# Patient Record
Sex: Female | Born: 1965 | Race: White | Hispanic: No | Smoking: Never smoker
Health system: Southern US, Community
[De-identification: ages and names within clinical notes are randomized; demographics above are authoritative.]

## PROBLEM LIST (undated history)

## (undated) DIAGNOSIS — E7800 Pure hypercholesterolemia, unspecified: Secondary | ICD-10-CM

## (undated) DIAGNOSIS — F329 Major depressive disorder, single episode, unspecified: Secondary | ICD-10-CM

## (undated) DIAGNOSIS — M1990 Unspecified osteoarthritis, unspecified site: Secondary | ICD-10-CM

## (undated) DIAGNOSIS — Z87442 Personal history of urinary calculi: Secondary | ICD-10-CM

## (undated) DIAGNOSIS — F32A Depression, unspecified: Secondary | ICD-10-CM

## (undated) DIAGNOSIS — D332 Benign neoplasm of brain, unspecified: Secondary | ICD-10-CM

## (undated) DIAGNOSIS — K635 Polyp of colon: Secondary | ICD-10-CM

## (undated) DIAGNOSIS — G40909 Epilepsy, unspecified, not intractable, without status epilepticus: Secondary | ICD-10-CM

## (undated) DIAGNOSIS — R569 Unspecified convulsions: Secondary | ICD-10-CM

## (undated) DIAGNOSIS — K760 Fatty (change of) liver, not elsewhere classified: Secondary | ICD-10-CM

## (undated) DIAGNOSIS — G2581 Restless legs syndrome: Secondary | ICD-10-CM

## (undated) DIAGNOSIS — R42 Dizziness and giddiness: Secondary | ICD-10-CM

## (undated) DIAGNOSIS — L309 Dermatitis, unspecified: Secondary | ICD-10-CM

## (undated) DIAGNOSIS — J42 Unspecified chronic bronchitis: Secondary | ICD-10-CM

## (undated) HISTORY — DX: Unspecified convulsions: R56.9

## (undated) HISTORY — PX: JOINT REPLACEMENT: SHX530

## (undated) HISTORY — DX: Unspecified osteoarthritis, unspecified site: M19.90

## (undated) HISTORY — DX: Polyp of colon: K63.5

## (undated) HISTORY — PX: BLADDER SUSPENSION: SHX72

## (undated) HISTORY — PX: TOTAL HIP ARTHROPLASTY: SHX124

## (undated) HISTORY — PX: COLONOSCOPY: SHX174

---

## 1997-09-25 ENCOUNTER — Emergency Department (HOSPITAL_COMMUNITY): Admission: EM | Admit: 1997-09-25 | Discharge: 1997-09-25 | Payer: Self-pay | Admitting: Emergency Medicine

## 1998-01-13 HISTORY — PX: BUNIONECTOMY: SHX129

## 1999-01-14 HISTORY — PX: ABDOMINAL HYSTERECTOMY: SHX81

## 1999-05-10 ENCOUNTER — Inpatient Hospital Stay (HOSPITAL_COMMUNITY): Admission: RE | Admit: 1999-05-10 | Discharge: 1999-05-15 | Payer: Self-pay | Admitting: Orthopedic Surgery

## 1999-05-10 ENCOUNTER — Encounter: Payer: Self-pay | Admitting: Orthopedic Surgery

## 2000-05-01 ENCOUNTER — Encounter: Admission: RE | Admit: 2000-05-01 | Discharge: 2000-05-01 | Payer: Self-pay | Admitting: *Deleted

## 2000-05-01 ENCOUNTER — Encounter: Payer: Self-pay | Admitting: *Deleted

## 2000-09-02 ENCOUNTER — Ambulatory Visit (HOSPITAL_COMMUNITY): Admission: RE | Admit: 2000-09-02 | Discharge: 2000-09-02 | Payer: Self-pay | Admitting: *Deleted

## 2000-10-12 ENCOUNTER — Encounter: Payer: Self-pay | Admitting: *Deleted

## 2000-10-12 ENCOUNTER — Inpatient Hospital Stay (HOSPITAL_COMMUNITY): Admission: EM | Admit: 2000-10-12 | Discharge: 2000-10-13 | Payer: Self-pay | Admitting: *Deleted

## 2000-11-22 ENCOUNTER — Inpatient Hospital Stay (HOSPITAL_COMMUNITY): Admission: EM | Admit: 2000-11-22 | Discharge: 2000-11-24 | Payer: Self-pay

## 2001-03-15 ENCOUNTER — Encounter: Payer: Self-pay | Admitting: Internal Medicine

## 2001-03-15 ENCOUNTER — Inpatient Hospital Stay (HOSPITAL_COMMUNITY): Admission: EM | Admit: 2001-03-15 | Discharge: 2001-03-17 | Payer: Self-pay | Admitting: *Deleted

## 2001-03-28 ENCOUNTER — Emergency Department (HOSPITAL_COMMUNITY): Admission: EM | Admit: 2001-03-28 | Discharge: 2001-03-28 | Payer: Self-pay | Admitting: Emergency Medicine

## 2001-07-02 ENCOUNTER — Emergency Department (HOSPITAL_COMMUNITY): Admission: EM | Admit: 2001-07-02 | Discharge: 2001-07-02 | Payer: Self-pay | Admitting: Emergency Medicine

## 2002-03-18 ENCOUNTER — Encounter: Payer: Self-pay | Admitting: Internal Medicine

## 2002-03-18 ENCOUNTER — Observation Stay (HOSPITAL_COMMUNITY): Admission: EM | Admit: 2002-03-18 | Discharge: 2002-03-19 | Payer: Self-pay | Admitting: Emergency Medicine

## 2002-04-28 ENCOUNTER — Encounter: Payer: Self-pay | Admitting: Emergency Medicine

## 2002-04-28 ENCOUNTER — Emergency Department (HOSPITAL_COMMUNITY): Admission: EM | Admit: 2002-04-28 | Discharge: 2002-04-28 | Payer: Self-pay | Admitting: Emergency Medicine

## 2002-09-22 ENCOUNTER — Emergency Department (HOSPITAL_COMMUNITY): Admission: EM | Admit: 2002-09-22 | Discharge: 2002-09-22 | Payer: Self-pay

## 2002-10-24 ENCOUNTER — Emergency Department (HOSPITAL_COMMUNITY): Admission: EM | Admit: 2002-10-24 | Discharge: 2002-10-25 | Payer: Self-pay | Admitting: Emergency Medicine

## 2003-07-22 ENCOUNTER — Emergency Department (HOSPITAL_COMMUNITY): Admission: EM | Admit: 2003-07-22 | Discharge: 2003-07-23 | Payer: Self-pay | Admitting: Emergency Medicine

## 2003-07-31 ENCOUNTER — Emergency Department (HOSPITAL_COMMUNITY): Admission: EM | Admit: 2003-07-31 | Discharge: 2003-07-31 | Payer: Self-pay | Admitting: Emergency Medicine

## 2003-10-31 ENCOUNTER — Other Ambulatory Visit: Admission: RE | Admit: 2003-10-31 | Discharge: 2003-10-31 | Payer: Self-pay | Admitting: Family Medicine

## 2004-02-03 ENCOUNTER — Ambulatory Visit: Payer: Self-pay | Admitting: Psychiatry

## 2004-02-03 ENCOUNTER — Inpatient Hospital Stay (HOSPITAL_COMMUNITY): Admission: AD | Admit: 2004-02-03 | Discharge: 2004-02-06 | Payer: Self-pay | Admitting: Psychiatry

## 2004-07-01 ENCOUNTER — Other Ambulatory Visit: Admission: RE | Admit: 2004-07-01 | Discharge: 2004-07-01 | Payer: Self-pay | Admitting: Obstetrics and Gynecology

## 2005-01-06 ENCOUNTER — Emergency Department (HOSPITAL_COMMUNITY): Admission: EM | Admit: 2005-01-06 | Discharge: 2005-01-06 | Payer: Self-pay | Admitting: Emergency Medicine

## 2005-04-23 ENCOUNTER — Ambulatory Visit (HOSPITAL_COMMUNITY): Admission: RE | Admit: 2005-04-23 | Discharge: 2005-04-23 | Payer: Self-pay | Admitting: Podiatry

## 2005-09-24 ENCOUNTER — Ambulatory Visit (HOSPITAL_COMMUNITY): Admission: RE | Admit: 2005-09-24 | Discharge: 2005-09-24 | Payer: Self-pay | Admitting: Podiatry

## 2006-07-03 ENCOUNTER — Ambulatory Visit (HOSPITAL_COMMUNITY): Admission: RE | Admit: 2006-07-03 | Discharge: 2006-07-03 | Payer: Self-pay | Admitting: Family Medicine

## 2006-11-25 ENCOUNTER — Ambulatory Visit (HOSPITAL_BASED_OUTPATIENT_CLINIC_OR_DEPARTMENT_OTHER): Admission: RE | Admit: 2006-11-25 | Discharge: 2006-11-25 | Payer: Self-pay | Admitting: Orthopedic Surgery

## 2007-01-05 ENCOUNTER — Emergency Department (HOSPITAL_COMMUNITY): Admission: EM | Admit: 2007-01-05 | Discharge: 2007-01-05 | Payer: Self-pay | Admitting: Emergency Medicine

## 2007-01-27 ENCOUNTER — Inpatient Hospital Stay (HOSPITAL_COMMUNITY): Admission: RE | Admit: 2007-01-27 | Discharge: 2007-01-28 | Payer: Self-pay | Admitting: Obstetrics and Gynecology

## 2007-01-27 ENCOUNTER — Encounter (INDEPENDENT_AMBULATORY_CARE_PROVIDER_SITE_OTHER): Payer: Self-pay | Admitting: Obstetrics and Gynecology

## 2007-03-02 ENCOUNTER — Encounter: Admission: RE | Admit: 2007-03-02 | Discharge: 2007-03-02 | Payer: Self-pay | Admitting: Family Medicine

## 2007-06-30 ENCOUNTER — Encounter: Admission: RE | Admit: 2007-06-30 | Discharge: 2007-06-30 | Payer: Self-pay | Admitting: Family Medicine

## 2007-07-21 ENCOUNTER — Ambulatory Visit: Payer: Self-pay | Admitting: Internal Medicine

## 2007-07-21 DIAGNOSIS — R195 Other fecal abnormalities: Secondary | ICD-10-CM | POA: Insufficient documentation

## 2007-07-21 DIAGNOSIS — K5900 Constipation, unspecified: Secondary | ICD-10-CM | POA: Insufficient documentation

## 2007-07-21 DIAGNOSIS — R1031 Right lower quadrant pain: Secondary | ICD-10-CM | POA: Insufficient documentation

## 2007-08-02 ENCOUNTER — Encounter: Admission: RE | Admit: 2007-08-02 | Discharge: 2007-08-02 | Payer: Self-pay | Admitting: Obstetrics and Gynecology

## 2007-08-11 ENCOUNTER — Encounter: Payer: Self-pay | Admitting: Internal Medicine

## 2007-08-11 ENCOUNTER — Ambulatory Visit: Payer: Self-pay | Admitting: Internal Medicine

## 2007-08-13 ENCOUNTER — Encounter: Payer: Self-pay | Admitting: Internal Medicine

## 2007-12-03 ENCOUNTER — Emergency Department (HOSPITAL_COMMUNITY): Admission: EM | Admit: 2007-12-03 | Discharge: 2007-12-04 | Payer: Self-pay | Admitting: Emergency Medicine

## 2008-03-01 ENCOUNTER — Emergency Department (HOSPITAL_COMMUNITY): Admission: EM | Admit: 2008-03-01 | Discharge: 2008-03-01 | Payer: Self-pay | Admitting: Emergency Medicine

## 2009-03-01 ENCOUNTER — Emergency Department (HOSPITAL_COMMUNITY): Admission: EM | Admit: 2009-03-01 | Discharge: 2009-03-01 | Payer: Self-pay | Admitting: Emergency Medicine

## 2010-02-12 NOTE — Letter (Signed)
Summary: Patient Notice- Polyp Results  [facility]  [address] [place], [place] [address]   Phone: [phone]  Fax: [phone]        August 13, 2007 MRN: [id number]    [facility] [address] [place], [place]  [address]    Dear Ms. Gass,  I am pleased to inform you that the colon polyp(s) removed during your recent colonoscopy was (were) found to be benign (no cancer detected) upon pathologic examination.  I recommend you have a repeat colonoscopy examination in 5 years to look for recurrent polyps, as having colon polyps increases your risk for having recurrent polyps or even colon cancer in the future.  Should you develop new or worsening symptoms of abdominal pain, bowel habit changes or bleeding from the rectum or bowels, please schedule an evaluation with either your primary care physician or with me.  Additional information/recommendations:  _X_ No further action with gastroenterology is needed at this time. Please      follow-up with your primary care physician for your other healthcare      needs.  Please call us if you are having persistent problems or have questions about your condition that have not been fully answered at this time.  Sincerely,  Hilarie Fredrickson MD  This letter has been electronically signed by your physician.

## 2010-02-12 NOTE — Procedures (Signed)
Summary: Colonoscopy   Colonoscopy  Procedure date:  08/11/2007  Findings:      Location:  [facility].    Procedures Next Due Date:    Colonoscopy: 08/2012  Patient Name: [name], [name] MRN:  Procedure Procedures: Colonoscopy CPT: 78295.    with polypectomy. CPT: A3573898.  Personnel: Endoscopist: Monica Bonito. Marina Goodell, MD.  Referred By: Monica Heap, MD.  Exam Location: Exam performed in Outpatient Clinic. Outpatient  Patient Consent: Procedure, Alternatives, Risks and Benefits discussed, consent obtained, from patient. Consent was obtained by the RN.  Indications  Evaluation of: Positive fecal occult blood test per home screening.  History  Current Medications: Patient is not currently taking Coumadin.  Pre-Exam Physical: Performed Aug 11, 2007. Cardio-pulmonary exam, Rectal exam, HEENT exam , Abdominal exam, Mental status exam WNL.  Comments: Pt. history reviewed/updated, physical exam performed prior to initiation of sedation?YES Exam Exam: Extent of exam reached: Terminal Ileum, extent intended: Terminal Ileum.  The cecum was identified by appendiceal orifice and IC valve. Patient position: on left side. Time to Cecum: 00:03:50. Time for Withdrawl: 00:17:02. Colon retroflexion performed. Images taken. ASA Classification: II. Tolerance: excellent.  Monitoring: Pulse and BP monitoring, Oximetry used. Supplemental O2 given.  Colon Prep Used MOVI PREP for colon prep. Prep results: good.  Sedation Meds: Patient assessed and found to be appropriate for moderate (conscious) sedation. Fentanyl 100 mcg. given IV. Versed 8 mg. given IV.  Findings NORMAL EXAM: Cecum to Rectum.  NORMAL EXAM: Ileum.  POLYP: Cecum, Procedure:  snare without cautery, removed, retrieved, Polyp sent to pathology. ICD9: Colon Polyps: 211.3. Comments: , .  POLYP: Sigmoid Colon, Procedure:  snare without cautery, removed, retrieved, sent to pathology.    Assessment  Diagnoses: 211.3: Colon Polyps. X 3.   Events  Unplanned Interventions: No intervention was required.  Unplanned Events: There were no complications. Plans Disposition: After procedure patient sent to recovery. After recovery patient sent home.  Scheduling/Referral: Colonoscopy, to Monica Bonito. Marina Goodell, MD, IN 5 YEARS IF POLYPS ADENOMATOUS; OTHERWISE 10 YRS,    cc:  Monica Heap, MD      Monica Pro. Curlene Labrum, MD     REPORT OF SURGICAL PATHOLOGY   Case #: [id number] Patient Name: [name], [name] Office Chart Number:  [name] [id number]   MRN: [id number] Pathologist: Monica Gandy. Luisa Hart, MD DOB/Age  Nov 24, 1965 (Age: 45)    Gender: F Date Taken:  08/11/2007 Date Received: 08/12/2007   FINAL DIAGNOSIS   ***MICROSCOPIC EXAMINATION AND DIAGNOSIS***   1.  COLON, CECAL POLYPS:  TWO TUBULAR ADENOMAS.  NO HIGH GRADE DYSPLASIA OR MALIGNANCY IDENTIFIED.   2.  COLON, SIGMOID POLYPS:  HYPERPLASTIC POLYP(S).  NO ADENOMATOUS CHANGE OR MALIGNANCY IDENTIFIED.   cc Date Reported:  08/13/2007     Monica Gandy. Luisa Hart, MD *** Electronically Signed Out By JDP ***   Clinical information R/O adenomatous changes.  (cdc)   specimen(s) obtained 1: Colon, polyp(s), cecal 2: Colon, polyp(s), sigmoid   Gross Description 1.  Received in formalin are tan, soft tissue fragments that are submitted in toto.  Number:  two. Size:  both 0.5 cm, one block.   2.  Received in formalin is a single glistening, tan mucosal fragment, 0.3 cm.  The container and lid are rechecked for additional specimens and none are identified. One cassette is submitted, one block.  (TB:gt, 08/12/07)    gdt/     August 13, 2007 MRN: [id number]    [facility] [address] [place], [place]  [address]  Dear [name],  I am pleased to inform you that the colon polyp(s) removed during your recent colonoscopy was (were) found to be benign (no cancer detected) upon pathologic examination.  I recommend you have a  repeat colonoscopy examination in 5 years to look for recurrent polyps, as having colon polyps increases your risk for having recurrent polyps or even colon cancer in the future.  Should you develop new or worsening symptoms of abdominal pain, bowel habit changes or bleeding from the rectum or bowels, please schedule an evaluation with either your primary care physician or with me.  Additional information/recommendations:  _X_ No further action with gastroenterology is needed at this time. Please      follow-up with your primary care physician for your other healthcare      needs.  Please call us if you are having persistent problems or have questions about your condition that have not been fully answered at this time.  Sincerely,  Monica Fredrickson MD  This letter has been electronically signed by your physician.  This report was created from the original endoscopy report, which was reviewed and signed by the above listed endoscopist.

## 2010-04-30 LAB — URINALYSIS, ROUTINE W REFLEX MICROSCOPIC
Bilirubin Urine: NEGATIVE
Glucose, UA: NEGATIVE mg/dL
Hgb urine dipstick: NEGATIVE
Ketones, ur: NEGATIVE mg/dL
Nitrite: NEGATIVE
Protein, ur: NEGATIVE mg/dL
Specific Gravity, Urine: 1.023 (ref 1.005–1.030)
Urobilinogen, UA: 1 mg/dL (ref 0.0–1.0)
pH: 7.5 (ref 5.0–8.0)

## 2010-04-30 LAB — DIFFERENTIAL
Basophils Absolute: 0 10*3/uL (ref 0.0–0.1)
Basophils Relative: 0 % (ref 0–1)
Eosinophils Absolute: 0 10*3/uL (ref 0.0–0.7)
Eosinophils Relative: 0 % (ref 0–5)
Lymphocytes Relative: 15 % (ref 12–46)
Lymphs Abs: 1.2 10*3/uL (ref 0.7–4.0)
Monocytes Absolute: 0.4 10*3/uL (ref 0.1–1.0)
Monocytes Relative: 6 % (ref 3–12)
Neutro Abs: 6.4 10*3/uL (ref 1.7–7.7)
Neutrophils Relative %: 80 % — ABNORMAL HIGH (ref 43–77)

## 2010-04-30 LAB — COMPREHENSIVE METABOLIC PANEL
ALT: 28 U/L (ref 0–35)
AST: 26 U/L (ref 0–37)
Albumin: 4.3 g/dL (ref 3.5–5.2)
Alkaline Phosphatase: 81 U/L (ref 39–117)
BUN: 8 mg/dL (ref 6–23)
CO2: 25 mEq/L (ref 19–32)
Calcium: 9.4 mg/dL (ref 8.4–10.5)
Chloride: 108 mEq/L (ref 96–112)
Creatinine, Ser: 0.86 mg/dL (ref 0.4–1.2)
GFR calc Af Amer: >60 mL/min (ref >60)
GFR calc non Af Amer: >60 mL/min (ref >60)
Glucose, Bld: 110 mg/dL — ABNORMAL HIGH (ref 70–99)
Potassium: 3.9 mEq/L (ref 3.5–5.1)
Sodium: 141 mEq/L (ref 135–145)
Total Bilirubin: 0.7 mg/dL (ref 0.3–1.2)
Total Protein: 6.7 g/dL (ref 6.0–8.3)

## 2010-04-30 LAB — RAPID URINE DRUG SCREEN, HOSP PERFORMED
Amphetamines: NOT DETECTED
Barbiturates: NOT DETECTED
Benzodiazepines: POSITIVE — AB
Cocaine: NOT DETECTED
Opiates: NOT DETECTED
Tetrahydrocannabinol: NOT DETECTED

## 2010-04-30 LAB — POCT CARDIAC MARKERS
CKMB, poc: <1 ng/mL — ABNORMAL LOW (ref 1.0–8.0)
CKMB, poc: <1 ng/mL — ABNORMAL LOW (ref 1.0–8.0)
Myoglobin, poc: 53.9 ng/mL (ref 12–200)
Myoglobin, poc: 75.1 ng/mL (ref 12–200)
Troponin i, poc: <0.05 ng/mL (ref 0.00–0.09)
Troponin i, poc: <0.05 ng/mL (ref 0.00–0.09)

## 2010-04-30 LAB — CBC
HCT: 36.1 % (ref 36.0–46.0)
Hemoglobin: 12 g/dL (ref 12.0–15.0)
MCHC: 33.2 g/dL (ref 30.0–36.0)
MCV: 90.9 fL (ref 78.0–100.0)
Platelets: 246 10*3/uL (ref 150–400)
RBC: 3.97 MIL/uL (ref 3.87–5.11)
RDW: 12.2 % (ref 11.5–15.5)
WBC: 8 10*3/uL (ref 4.0–10.5)

## 2010-04-30 LAB — ETHANOL: Alcohol, Ethyl (B): 5 mg/dL (ref 0–10)

## 2010-05-28 NOTE — Consult Note (Signed)
[name], [name]               ACCOUNT NO.:  [id number]   MEDICAL RECORD NO.:  [id number]          PATIENT TYPE:  EMS   LOCATION:  ED                           FACILITY:  [facility]   PHYSICIAN:  Marolyn Hammock. Reynolds, M.D.DATE OF BIRTH:  17-Sep-1965   DATE OF CONSULTATION:  03/01/2008  DATE OF DISCHARGE:                                 CONSULTATION   CHIEF COMPLAINT:  Seizure.   HISTORY OF PRESENT ILLNESS:  This is a pleasant 45 year old white female  who is well known to [name] and [facility].  The patient has a past medical  history of motor seizures along with pseudoseizures.  The patient is  currently on Keppra 1 g t.i.d., Lamictal 200 mg b.i.d., and Ativan 0.5  mg to 1 mg p.r.n. q.6 h.  The patient has not had a known seizure  according to her in the past 2 months.  Husband states that the patient  has been under significant amount of stress recently, as she has  recently lost her job.  Husband is unemployed and son is incarcerated at  this time.  Over the last 3 days, the patient has been complaining of an  earache in her left ear and disequilibrium.  According to husband, this  morning the patient got up, was eating breakfast, went to the living  room and noted that she was having diplopia, called her husband into the  room, stated that she was seeing 3 people on the TV screen at that  moment.  Right leg active in the flailing like motion and followed by  bilateral arms flapping.  No tongue biting or incontinence.  No  drooling.  Post motor activity, the patient had garbled speech which  cleared in approximately 10-15 minutes and has progressively been  clearing throughout the day.  Although the garbled speech has been  clearing, the patient still has disequilibrium which is most notably  when she goes from a lying-to-seated or seated-to-standing position.  The patient states that she states the room moving from right to left,  it lasted approximately 1-2 minutes and then dissipated.   The patient  states that she has been taking her medications on time with the  exception of 1 dose approximately 3 days ago of her Keppra which was the  middle dose.  The patient does not believe that this was a  pseudoseizure.  At the time of interview, the patient is alert.  She is  oriented to time, place, person.  She is well enunciated with the  exception of little bit of a slurred speech; however, husband states  this is normal for her.  She has not seen double vision at the time of  interview.   PAST MEDICAL HISTORY:  Positive for headache, seizure activity,  depression, suicidal attempt.   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   MEDICATIONS:  1. Keppra 1 g t.i.d.  2. Prozac 20 mg daily.  3. Lamictal 200 mg b.i.d.  4. Ativan 0.5 mg 1-2 p.r.n. q.4-6 h.   ALLERGIES:  LASIX, CODEINE, and MORPHINE.   SOCIAL HISTORY:  She does smoke.  Does not drink or do illicit drugs.   Review of systems were negative with the exception of above.   PHYSICAL EXAMINATION:  VITAL SIGNS:  Blood pressure is 124/80, pulse 96,  respiratory rate 20, temperature 98.7.  NEUROLOGIC:  Mental status, she is alert and oriented x3, carries out 2-  and 3-step commands without difficulty.  She is attentive and aware of  her surroundings.  She is dressed appropriately and acts appropriately.  Cranial nerves, pupils equal, round, and reactive to light and  accomodating approximately 3 mm, 2 mm.  She has conjugate gaze.  Extraocular muscles are intact.  Visual fields are fully intact.  Face  is symmetrical.  Tongue is midline.  Uvula is midline.  The patient does  have a slight slur in her speech; however, this patient's husband states  this is baseline for the patient.  She has no facial droop.  Sensation  V1 through V3 are equal bilaterally to pinprick and light touch.  Shoulder shrug, head turn is normal.  Coordination, finger-to-nose  is  within normal limits and smooth and heel-to-shin within normal limits  and smooth.  Fine motor movements and rapid alternating movements are  within normal limits.  Gait, the patient was unable to participate in a  full gait test, however, this was secondary to the patient describing a  disequilibrium.  Sensation, she was very hesitant to take steps and  overemphasized feeling off balance during exam.  When asked to close her  eyes and put her hands on front of her, she had a negative Romberg.  No  difficulty keeping her balance.  Motor was full strength bilaterally in  upper and lower extremities.  The patient had no tremor, no clonus,  normal bulk and tone.  Full range of motion of all joints.  EXTREMITIES:  Deep tendon reflexes were 2+ throughout the upper  extremity, 3+ bilaterally at the patella, 2+ at the Achilles and  bilateral downgoing toes.  Drift was negative bilaterally in upper  extremities and lower extremities.  Sensation was full throughout,  pinprick, light touch, and vibration.  PULMONARY:  Clear to auscultation.  CARDIOVASCULAR:  S1 and S2.  Regular rate and rhythm.  No murmurs.  NECK:  Negative bruits and supple.  HEENT:  Examination of tympanic membrane bilaterally showed good cone of  reflex, pink, nonerythemic, no effusion.   LABORATORY DATA:  At this time, myoglobin was 539, CK-MB was less than  1.0, troponin less than 0.05.  AST and ALT normal.  EtOH was 5.  Urine  drug screen showed that it was positive for benzodiazepines; however,  the patient is on Ativan.  Urinalysis was negative.  Sodium 141,  potassium 3.9, chloride 108, CO2 25, BUN 8, creatinine 0.86, glucose  110, white blood cell count 8.0, platelet 246, hemoglobin is 12.0 and  hematocrit is 36.1.   IMAGING AND TEST:  CT of the head was normal.   ASSESSMENT:  At this time, this is a 45 year old white female with  blurred vision, diplopia, dysarthria x3 days, 1 seizure-like activity.  No cranial  nerve palsies or residual weakness.  CT was negative.  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  At this time with this  consultation, we will order EEG, MRI of the brain,  PT before getting  eval, Lamictal level.  I will continue to follow the patient while in-  house.  The patient feels better.  Tomorrow, most likely the patient  will be discharged home.  She will remain on all her antiepileptic  drugs.  I will discuss this with Dr. Thad Ranger.     ______________________________  Felicie Morn, PA-C      Marolyn Hammock. Thad Ranger, M.D.  Electronically Signed   DS/MEDQ  D:  03/01/2008  T:  03/02/2008  Job:  [id number]   cc:   Casimiro Needle L. Thad Ranger, M.D.  Fax: [phone]

## 2010-05-28 NOTE — Op Note (Signed)
NAMESANSA, [name]               ACCOUNT NO.:  [id number]   MEDICAL RECORD NO.:  [id number]          PATIENT TYPE:  INP   LOCATION:  0006                         FACILITY:  [facility]   PHYSICIAN:  Malachi Pro. Ambrose Mantle, M.D. DATE OF BIRTH:  09-20-1965   DATE OF PROCEDURE:  01/27/2007  DATE OF DISCHARGE:                               OPERATIVE REPORT   PREOPERATIVE DIAGNOSES:  1. Dysmenorrhea.  2. Dyspareunia.  3. Stress urinary incontinence.   POSTOPERATIVE DIAGNOSES:  1. Dysmenorrhea.  2. Dyspareunia.  3. Stress urinary incontinence.   OPERATION:  1. Abdominal hysterectomy, removal of proximal segments of both      fallopian tubes and a right peritubal cyst by Dr. Ambrose Mantle.  2. Sling procedure by Dr. Vonita Moss.   General anesthesia.   The patient was brought to the operating room and placed under  satisfactory general anesthesia.  Because she had had prior hip  replacements, she was positioned in the position that she would need to  be in for Dr. Enos Fling procedure while she was awake and while in  [place] stirrups, she was placed under general anesthesia.  The abdomen,  vulva, vagina and urethra were prepped with Betadine solution.  The  abdomen was draped as a sterile field and then a Foley catheter was  inserted to straight drain.  The patient was LATEX-allergic and all  materials were LATEX-free.  The patient had a panniculus so I did not  want to make the incision in the area that would fold under, so I made  an incision in an area on the abdominal wall that would not be hidden in  the postop period.  The incision was carried in layers through the skin,  subcutaneous tissue and fascia.  The fascia was incised transversely,  separated from the rectus muscles superiorly and inferiorly, and then  the rectus muscle was divided in the midline.  Peritoneum was opened  vertically.  Exploration of the upper abdomen was somewhat difficult  because of the fairly small incision but I was  able to reach the liver,  which was normal.  I felt both kidneys, they were normal.  No upper  abdominal abnormalities were noted.  Exploration of the pelvis revealed  the posterior cul-de-sac to be free of any endometriosis.  The uterus  itself had probably a 2-3 cm fibroid on the left anterior surface.  The  anterior cul-de-sac showed minimal evidence of her previous tubal  ligations.  The right ovary was normal.  The right tube had previously  been ligated.  There was a peritubal cyst on the right.  The left tube  and ovary appeared normal except for similar findings of a previous  tubal ligation.  Because the patient was quite narrow, I used an Network engineer to prevent any injury to the sidewalls.  I then used three  packs to pack the bowel away, elevated the uterus into the incisional  opening with two Kelly clamps across the upper pedicles, divided both  round ligaments, created a bladder flap, divided the upper pedicles  between clamps and doubly suture-ligated both upper  pedicles.  I then  skeletonized both uterine vessels, clamped, cut and suture ligated them,  clamped, cut and suture ligated the parametrial and paracervical tissues  until I reached the uterosacral ligaments, which were held.  The right  vaginal angle was entered.  A vaginal angle suture was placed and then  the uterus was removed by transecting the upper vagina.  A left vaginal  angle suture was placed and then the central portion of the vagina was  closed with interrupted figure-of-eight sutures of 0 Vicryl.  The  bladder was kept well free of the operative field at all times.  Liberal  irrigation confirmed hemostasis.  I then sutured the uterosacral  ligaments together in the midline, palpated both ureters.  There were  both normal in course and contour.  I reperitonealized across the  vaginal cuff.  Then I removed the proximal segment of both fallopian  tubes to make sure she did not develop a  hydrosalpinx.  This was mainly  done with the Bovie and hemostasis was achieved.  Packs and retractors  were then removed and the abdominal wall was closed with a running  suture of 0 Vicryl on the peritoneum, two running sutures of 0 Vicryl on  the fascia, a running suture of 3-0 Vicryl on the subcu tissue and  staples on the skin.  A dressing was applied and Dr. Vonita Moss entered  the operating theatre and proceeded to began his sling procedure.  Blood  loss for this part of the surgery was estimated at 100 mL.      Malachi Pro. Ambrose Mantle, M.D.  Electronically Signed     TFH/MEDQ  D:  01/27/2007  T:  01/27/2007  Job:  [id number]

## 2010-05-28 NOTE — Op Note (Signed)
[name], [name]               ACCOUNT NO.:  [id number]   MEDICAL RECORD NO.:  [id number]          PATIENT TYPE:  AMB   LOCATION:  [facility]                         FACILITY:  [facility]   PHYSICIAN:  Ollen Gross, M.D.    DATE OF BIRTH:  1966-01-10   DATE OF PROCEDURE:  11/25/2006  DATE OF DISCHARGE:                               OPERATIVE REPORT   PREOPERATIVE DIAGNOSES:  1. Right meniscal tear.  2. Chondral defect.   POSTOPERATIVE DIAGNOSES:  1. Right meniscal tear.  2. Chondral defect.   PROCEDURE:  Right knee arthroscopy with medial meniscal debridement and  chondroplasty.   ASSISTANT:  None.   ANESTHESIA:  General.   ESTIMATED BLOOD LOSS:  Minimal.   DRAINS:  None.   COMPLICATIONS:  none   [name] is a 45 year old female, greater than one year history of  significant right knee pain and recent onset of mechanical symptoms.  She has had injections of cortisone as well as viscous supplements  feeling a lot of this was arthritic in nature.  Unfortunately she did  not get better and then started developing mechanical symptoms.  She  presents now for arthroscopy and debridement.   PROCEDURE IN DETAIL:  After successful administration of general  anesthetic, a tourniquet was placed high on the right thigh.  The right  lower extremity was prepped and draped in the usual sterile fashion.  A  standard supramedial and inferolateral incisions were made and inflow  cannula passed superomedial and counter passed inferolateral.  Arthroscopic visualization proceeds.   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  There was no full thickness defect, but his trochlea looks normal.  The  medial and lateral gutters were visualized.  There were no loose bodies.  Flexion valgus force was applied to the knee and the medial compartment  is entered.  There was evidence of chondral defect off the medial  femoral condyle about 1 x 1 cm as well as a tear at the posterior  root  of the meniscus.  A spinal needle was used to localize the inferomedial  portals.  A small incision made and dilator placed and then I debrided  the meniscus back to stable base.  The basket and the 4.2 mm shaver and  sealed it off with the ArthroCare.  I then used the shaver to debride  the chondral defect back to a stable bony base with stable cartilaginous  edges.  The rest of the condyle looked fine.  This area was about 1 x 1  cm.  The intercondylar notch was visualized.  The ACL looks normal.  The  lateral compartment was entered and it is normal.  I debrided the under  surface of the patella back to a stable cartilaginous base with the  shaver.   The arthroscopic equipment is then removed from the inferior portals  which were closed with interrupted 4-0 nylon.  The 20 mL of 0.25%  Marcaine with epinephrine were injected through the inflow cannula and  that is removed and that portal closed with nylon.  A bulky sterile  dressing was  applied.  She is awaken and transported to recovery in  stable condition.      Ollen Gross, M.D.  Electronically Signed     FA/MEDQ  D:  11/25/2006  T:  11/26/2006  Job:  [id number]

## 2010-05-28 NOTE — H&P (Signed)
[name]               ACCOUNT NO.:  [id number]   MEDICAL RECORD NO.:  [id number]          PATIENT TYPE:  INP   LOCATION:  NA                           FACILITY:  [facility]   PHYSICIAN:  Malachi Pro. Ambrose Mantle, M.D. DATE OF BIRTH:  22-Sep-1965   DATE OF ADMISSION:  01/27/2007  DATE OF DISCHARGE:                              HISTORY & PHYSICAL   HISTORY OF PRESENT ILLNESS:  This is a 45 year old white married female,  para 2-0-0-2, who was admitted to the hospital for abdominal  hysterectomy and sling procedure because of dysmenorrhea, dyspareunia,  stress urinary incontinence.  The dyspareunia has been unresponsive to  medical therapy.  This patient was seen in the emergency room in July  2008 for evaluation of pain in her right side.  She had a CT scan of the  pelvis and abdomen that showed normal abdominal CT scan, bilateral  kidney stones and a negative pelvis except for an external iliac lymph  node 9 mm in diameter and a cystic lesion in the right adnexa.  She then  came to see me on July 15, 2006, complaining of pain after intercourse  for 2 months. She also complained of vaginal dryness.  On exam, the  uterus was upper limits of normal size, and there was the possibility of  a right adnexal mass close to the right uterosacral ligament.  Because  of the history of intramenstrual bleeding, I performed a Pipelle  aspiration of the endometrial cavity, and the pathology was benign.  There was no hyperplasia or malignancy.  Because of her vaginal dryness  and pain with intercourse,  I treated her with Prempro 0.625/2.5 for a  month.  She continued to complain of pain in her right lower quadrant  that might have been related to the kidney stone.  She reported  sometimes she would have to interrupt sex because of pain.  After trying  the Prempro for a month, she reported she was not improved, and she was  advised to try Estradiol cream for a month.  She also reported stress  urinary  incontinence, and on November 20, 2006, she came back to check  her fibroid and pain.  She had undergone an ultrasound in our office on  August 18, 2006, that actually showed a very small  fibroid, 1.5 to 2 cm  in diameter.  I advised her that the fibroid was not a significant  issue, but she complained to still have dyspareunia and stress urinary  incontinence.  She saw Dr. Vonita Moss for urodynamics.  He felt that she  had mild stress urinary incontinence and felt that she was a candidate  for the sling procedure.  At that time,  she was to have knee  arthroscopy on November 25, 2006, and once she recovered from that, she  requested to proceed with the hysterectomy and sling procedure.  She  reports significant dysmenorrhea and continues to state pain with  intercourse is unabated.  She has been married for 22 years, has sex 4  times a week, but this has only been a problem for  the last 6 months.   PAST MEDICAL HISTORY:   ALLERGIES:  VICODIN and CODEINE.  Both caused itching.  The patient  states she is allergic to LATEX.  She also says that she was allergic to  PENICILLIN, but she can take Keflex.   MEDICATIONS:  Include Keppra, Lamictal, and Prozac.   OPERATION:  1. Three total hip replacements.  2. A hip bone graft.  3. Two C-sections.  4. A tubal ligation.   She does have a seizure disorder.   REVIEW OF SYSTEMS:  No heart problems, no visual problems, no headaches,  no bowel problems.  She does have stress urinary incontinence.   SOCIAL HISTORY:  No alcohol or tobacco.  She is disabled.   FAMILY HISTORY:  Father died at 26 with kidney failure.  Mother is 87  and healthy.  A 54 year old brother and a 6 year old brother are both  healthy.  Her two children, 41 year old and 48 year old males are  healthy.  The patient did deliver via C-section in 1987 and 1988, 8  pound  9 ounce and 7 pound 9 ounce males without problems.   PHYSICAL EXAMINATION:  GENERAL:  Well-developed,  slightly obese white  female in no acute distress.  VITAL SIGNS:  She is 5 feet tall and weighs 156 pounds.  In July 2008,  she weighed 182 pounds.  HEAD, EYES, EARS, NOSE, THROAT:  No cranial abnormalities.  Extraocular  movements are intact.  Nose and pharynx are clear.  There is an upper  partial plate.  NECK:  Supple without thyromegaly.  BREASTS:  Soft without masses.  HEART:  Normal size and sounds.  No murmurs.  LUNGS:  Clear to auscultation.  ABDOMEN:  Soft.  There is a panniculus in the lower abdomen.  What is  thought to be the previous C-section scars are in the crease of the  panniculus, and I will not make the incision there, will make the  incision on the flap of the panniculus so the incision can breathe.  Liver, spleen, and kidneys are not felt, and there are no masses  palpable.  PELVIC:  Vulva and vagina are clean.  The cervix is clean.  Uterus is  anterior, feels to be normal size, somewhat tender to motion.  The  adnexa are clear.  RECTAL:  Exam in July was negative.   ADMITTING IMPRESSION:  Persistent dysmenorrhea and dyspareunia with  stress urinary incontinence.  The patient is prepared for abdominal  hysterectomy.  If any other pathology is seen like endometriosis, scar  tissue, it will be handled as much as possible without risking injury to  surrounding organs.  It it appears that the tubes and ovaries need to be  removed, they will  be removed, but the plan is to remove the uterus  only.  Dr. Vonita Moss will then perform the sling procedure.  The patient  has been counseled about the risks of surgery including but not limited  to heart attack, stroke, pulmonary embolus, wound disruption, hemorrhage  with need for reoperation and/or transfusion, fistula formation,  nerve  injury, intestinal obstruction.  She understands and agrees to proceed.      Malachi Pro. Ambrose Mantle, M.D.  Electronically Signed     TFH/MEDQ  D:  01/26/2007  T:  01/26/2007  Job:   [id number]   cc:   Maretta Bees. Vonita Moss, M.D.  Fax: [phone]

## 2010-05-28 NOTE — Discharge Summary (Signed)
[name], [name]               ACCOUNT NO.:  [id number]   MEDICAL RECORD NO.:  [id number]          PATIENT TYPE:  INP   LOCATION:  1528                         FACILITY:  [facility]   PHYSICIAN:  Malachi Pro. Ambrose Mantle, M.D. DATE OF BIRTH:  11/23/1965   DATE OF ADMISSION:  01/27/2007  DATE OF DISCHARGE:  01/28/2007                               DISCHARGE SUMMARY   HISTORY OF PRESENT ILLNESS:  This is a 44 year old white female admitted  for abdominal hysterectomy because of dysmenorrhea and dyspareunia and  stress urinary incontinence.  Dr. Vonita Moss was to do a sling procedure.  The patient underwent the abdominal hysterectomy with removal of the  proximal segments of both fallopian tubes by Dr. Ambrose Mantle with Dr.  Senaida Ores assisting, and Dr. Vonita Moss did an obturator sling procedure  and a cystoscopy.  Postoperatively, the patient had a temperature  elevation to 102 degrees several hours after surgery but remained  afebrile thereafter.  She had no significant complaints.  Her abdomen  remained soft and nontender.  She ambulated well without difficulty.  After the catheter was removed, she voided well, she tolerated a diet,  and on the evening of postop day #1, she was ready for discharge.   LABORATORY DATA:  Initial white count 5400, hemoglobin 12/08, hematocrit  36.3, platelet count 335,000, 57 segs, 35 lymphs, 7 monos, one  eosinophil.  Comprehensive metabolic profile showed a blood glucose of  107.  The patient states she was fasting otherwise it was completely  normal.  Urine pregnancy test was negative.  EKG was normal.  Pathology  report is pending.   FINAL DIAGNOSES:  1. Dysmenorrhea.  2. Dyspareunia.  3. Stress urinary incontinence.   OPERATION:  Abdominal hysterectomy, removal of the proximal segments of  both fallopian tubes, sling procedure by Dr. Vonita Moss.   FINAL CONDITION:  Improved.   INSTRUCTIONS:  1. Report any temperature elevation greater than 100.4 degrees.  2.  Call with any unusual problems.  3. Call with any heavy bleeding from the vagina.  4. Avoid vaginal entrance for 6 weeks.  5. Return to my office in 4 days to have her staples removed.  6. She will resume her medications Keppra, Lamictal, Prozac and Ativan      at home, and she is given a prescription for Darvocet N-100 30      tablets one every 4-6 hours as needed for pain.  The patient was      treated with a PCA      Dilaudid pump, and either because of that or some other medication,      she had itching, but never had a significant rash.  Since she had a      sensitivity to codeine and other codeine-like products, I placed      her on Darvocet for pain.      Malachi Pro. Ambrose Mantle, M.D.  Electronically Signed     TFH/MEDQ  D:  01/28/2007  T:  01/29/2007  Job:  [id number]   cc:   Maretta Bees. Vonita Moss, M.D.  Fax: [phone]

## 2010-05-28 NOTE — Op Note (Signed)
[name], FEDORCHAK               ACCOUNT NO.:  [id number]   MEDICAL RECORD NO.:  [id number]          PATIENT TYPE:  INP   LOCATION:  0006                         FACILITY:  [facility]   PHYSICIAN:  Maretta Bees. Vonita Moss, M.D.DATE OF BIRTH:  06-Mar-1965   DATE OF PROCEDURE:  01/27/2007  DATE OF DISCHARGE:                               OPERATIVE REPORT   PREOPERATIVE DIAGNOSIS:  Stress urinary incontinence.   POSTOPERATIVE DIAGNOSIS:  Stress urinary incontinence.   PROCEDURE:  Obturator sling insertion and cystoscopy.   SURGEON:  Maretta Bees. Vonita Moss, M.D.   ANESTHESIA:  General.   INDICATIONS:  This lady has had problems with stress incontinence and  underwent urodynamics which showed stable bladder but just some mild  stress incontinence and good flow rate but a question of some mild  sphincter dyssynergia.  That probably increases her risk of retention  from 1 or 2% up to 4%, and the patient was advised.  On the other hand,  the obturator sling should correct her problem with stress incontinence.  She was advised about the risk of infection, erosion, incontinence or  retention.   PROCEDURE:  The patient was placed in the lithotomy position after Dr.  Ambrose Mantle had completed his abdominal hysterectomy.  Foley catheter was in  place.  Xylocaine with epinephrine was injected suburethrally, and a  midline suburethral incision was made in the vagina and then dissected  laterally out to the endopelvic fascia.  Stab wounds were made over the  obturator fossa bilaterally at the level of the clitoris and the curved  needle passer was inserted after emptying the bladder and the needle  passer was maneuvered around the back of the medial aspect of the  obturator bone to exit through the endopelvic fascia at mid urethra.  Sling was then attached to each needle passer and brought up through the  stab wound on each side.  I then cystoscoped her and there was no  evidence of bladder or urethral injury.   With a hemostat between the  urethra which now had a Foley replaced and the sling, the sling was  maneuvered into position in the mid urethral area with no undue tension.  The sling material had been irrigated with antibiotic solution.  At this  point the wound again was irrigated with antibiotic solution and the  vaginal incision closed with running 2-0 Vicryl.  The wound was then  packed with estrogen-impregnated vaginal pack.  Foley  catheter was connected to closed drainage.  Sponge, needle and  instrument count was correct.  The wounds over the obturator fossa were  then closed with Dermabond.  She was taken to the recovery room in good  condition with estimated blood loss of 50 mL from this part of the  procedure.      Maretta Bees. Vonita Moss, M.D.  Electronically Signed     LJP/MEDQ  D:  01/27/2007  T:  01/27/2007  Job:  [id number]   cc:   Malachi Pro. Ambrose Mantle, M.D.  Fax: [phone]

## 2010-05-28 NOTE — Consult Note (Signed)
[name], [name]               ACCOUNT NO.:  [id number]   MEDICAL RECORD NO.:  [id number]          PATIENT TYPE:  EMS   LOCATION:  ED                           FACILITY:  [facility]   PHYSICIAN:  Marolyn Hammock. Reynolds, M.D.DATE OF BIRTH:  11/22/1965   DATE OF CONSULTATION:  03/01/2008  DATE OF DISCHARGE:  03/01/2008                                 CONSULTATION   ADDENDUM:  The patient will not be admitted to the hospital.  The  patient will be sent home on regular medications.  I thoroughly  explained to the patient that at this time most likely this is not a  seizure activity.  If symptoms continue, the patient can call the office  of [facility] and talk to [name] or Associates and  MRI can be obtained outpatient.  After discussing this with the patient,  husband was wondering if they can get the MRI here while they are in the  hospital.  Wife clearly stated to her husband, I feel better, I do not  need an MRI, as did husband state that wife was speaking much more  clearly.  It should also be noted that EMS on triage stated when  arrived, the patient was thrashing on the floor and immediately raised  her head and looked at to the EMS.  Once in the truck, she would  swing  and kick at the EMS staff when not looking at them.  The EMS staff  informed the patient that if she hit them again, they would call the  police for assault charges and she immediately stopped seizing and  stopped all hitting.  It was also noted while the patient was in the ED,  the patient was with eyes closed, and not moving, when attempted to take  oral temperature, the patient clenched mouth shut.  The patient was  informed that they would have to do a rectal temperature, if she did not  open her mouth.  At that time, the patient opened her mouth.  The  patient's name was called and she answered what, but did not move or  open her eyes.  At this time, the patient will be discharged to home.  She will remain on Keppra, Lamictal, and Ativan.  Again, it was  explained to husband, if gait problem does not improve and speech does  not improve, to call the office, we will arrange for outpatient MRI and  EEG to be done.     ______________________________  Felicie Morn, PA-C      Marolyn Hammock. Thad Ranger, M.D.  Electronically Signed    DS/MEDQ  D:  03/01/2008  T:  03/02/2008  Job:  [id number]

## 2010-05-31 NOTE — Discharge Summary (Signed)
. [facility]  Patient:    AZLIN, ZILBERMAN Visit Number: [id number] MRN: [id number]          Service Type: Attending:  Yvette Rack. Dorna Bloom, M.D. Dictated by:   Wayne C. Dorna Bloom, M.D. Adm. Date:  10/12/00 Disc. Date: 10/13/00   CC:         Genene Churn. Love, M.D.   Discharge Summary  ADDENDUM:  I had discharged this patient on Tegretol 200 mg b.i.d.  However, today on October 14, 2000, I spoke with Genene Churn. Love, M.D.  He had read the EEG and thought it was markedly abnormal.  It had a seizure focus and he was apprised that the patient, although she denied it, had any family history of seizures.  He recommended an alternative medicine.  He recommended we put her on Depakote extended release 500 mg q.d.  Side effects could include weight gain, hair loss, or tremors.  She should then see [name] in about three weeks and at that time probably get a CBC, CMET, and Depakote level checked. The patient was fully informed about this and a prescription was written for her and she understands.  Finally, regarding driving restrictions, the patient is instructed that she is not allowed to drive until further notice. This will be handled by [name] as well. Dictated by:   Wayne C. Dorna Bloom, M.D. Attending:  Yvette Rack. Dorna Bloom, M.D. DD:  10/14/00 TD:  10/14/00 Job: [id number]

## 2010-05-31 NOTE — Discharge Summary (Signed)
[place]  Patient:    [name], ILLESCAS [facility] Visit Number: [id number] MRN: [id number]          Service Type: MED Location: 3000 [id number] Attending Physician:  Eliezer Champagne Dictated by:   Wayne C. Dorna Bloom, M.D. Admit Date:  10/12/2000 Discharge Date: 10/13/2000   CC:         Genene Churn. Love, M.D.  Catherine A. Orlin Hilding, M.D.   Discharge Summary  DISCHARGE MEDICATIONS: 1. Tegretol 200 mg b.i.d. 2. Magic Mouthwash 5 cc q.i.d. p.r.n. 3. Zoloft 50 mg q.d. 4. Nexium 40 mg q.d. p.r.n. reflux.  DISCHARGE INSTRUCTIONS:  The patient is not to have any more Ultram.  She is to limit Fiorinal used for headaches.  She may use Tylenol, aspirin, ibuprofen, or Aleve instead.  She is not to use any Ultracet either.  DIET:  No restrictions.  FOLLOW-UP:  With Dr. Dorna Bloom, at which time she is to get a Tegretol level.  She is also supposed to follow up in one month with Dr. Avie Echevaria.  DISCHARGE DIAGNOSES: 1. Seizure disorder. 2. 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 3. Multiple left hip replacements. 4. Reflux. 5. Hypokalemia. 6. Depression. 7. Chronic headaches.  HISTORY OF PRESENT ILLNESS AND HOSPITAL COURSE:  [name] is a 45 year old white female with a history of chronic left hip and left knee pain due to multiple joint replacements and DJD.  She had previously been on various nonsteroidals, but they upset her stomach.  Previously on Darvocet but had not taken for awhile.  She most recently has been on Ultracet which she takes one or two a day.  She had, apparently, taken two on the morning of full tonic-clonic seizure.  She had recurrent seizure in the ER which required Ativan and a Dilantin load.  After a 1 g Dilantin load the patient was begun on 100 mg three times a day of Dilantin.  She has had no further seizures since, and neurological exam is completely clear.  Laboratory work had  showed unremarkable magnesium, CMET, and CBC.  She did have some hypokalemia of 3.4 the next day, but this was replaced in IV fluids.  She also did have some acidosis but this resolved, likely from acute seizure.  The patients neurologic workup included MRI of the head.  This was because of chronic headaches and the fear that she might have a tumor causing the seizures.  Fortunately, the MRI of the brain was negative.  The patient then had an EEG today which, apparently, showed some seizure activity, and this reported to me by Dr. Jeannene Patella.  Dr. Orlin Hilding recommended that she go on Tegretol 20 mg twice a day and recheck levels in a week.  If the levels are low then increase her Tegretol 200 mg three times a day.  She is to follow up with Dr. Avie Echevaria in one month and thereafter for her seizures.  She is instructed that Tegretol can cause liver toxicity, but this is rare.  She is to let us know about any significant GI problems.  It can cause bone marrow suppression, so the patient is supposed to let us know if she develops unusually high fevers or infections.  It also can cause dizziness, nausea, vomiting, sedation, or feeling drunk.  In the meantime she is to desist from all Ultram or Ultracet use.  She was given Magic Mouthwash for biting of tongue  during her seizure. Dictated by:   Wayne C. Dorna Bloom, M.D. Attending Physician:  Eliezer Champagne DD:  10/13/00 TD:  10/14/00 Job: [id number]

## 2010-05-31 NOTE — Op Note (Signed)
NAMESUMIRE, HALBLEIB               ACCOUNT NO.:  [id number]   MEDICAL RECORD NO.:  [id number]          PATIENT TYPE:  AMB   LOCATION:  DAY                           FACILITY:  [facility]   PHYSICIAN:  Cody M. Ulice Brilliant, D.P.M.  DATE OF BIRTH:  July 02, 1965   DATE OF PROCEDURE:  09/24/2005  DATE OF DISCHARGE:                                 OPERATIVE REPORT   PREOPERATIVE DIAGNOSIS:  Hallux abductovalgus deformity, left foot.   POSTOPERATIVE DIAGNOSIS:  Hallux abductovalgus deformity, left foot.   PROCEDURE PERFORMED:  Reverdin osteotomy, first metatarsal, left foot.   SURGEON:  Denny Peon. Ulice Brilliant, D.P.M.   ANESTHESIA:  Monitored anesthesia care.   INDICATIONS FOR SURGERY:  Painful, longstanding, recurring hallux  abductovalgus deformity.  The patient has previously had an orthopedic  surgeon to correct for the HAV multiple years ago.  An under-correction of  the procedure, basically, was performed.  She has had recurrence of the  deformity.  She has requested surgical correction.  Basically, all shoes are  uncomfortable, painful ambulation.  Has done well with the right foot that  we did back in April 2007.   DESCRIPTION OF PROCEDURE:  Ms. Mak was brought into the OR and placed on  the table in the supine position.  IV sedation is established.  A Mayo block  is performed about the first MTP of the left foot.  A pneumatic ankle  tourniquet is then applied across her left ankle.  Her foot is prepped and  draped in the usual aseptic fashion, an Ace bandage utilized to exsanguinate  her foot.  The tourniquet is inflated to 250 mmHg.   Procedure:  Reverdin osteotomy, first metatarsal, left foot:  Attention was  directed to the first MTP.  A 7 cm slightly curvilinear skin incision is  planned and then carried forth with a #15 blade.  The incision is deepened  via sharp and blunt dissection through the subcutaneous tissues.  Care is  taken to get good exposure to the medial aspect of the joint  capsule.  An  inverted L capsulotomy is then performed.  The capsular and periosteal  tissues are then reflected sharply away from the underlying regular bony  surface.  Care is taken to get good exposure to the medial aspect of the  distal 40% of the metatarsal.  A hypertrophic segment of bone is noted in  the more proximal aspect of the distal midshaft first metatarsal.  This is  basically the exit site for which the previous Silver-type procedure was  performed.  The bony ledge is prominent and good soft tissue exposure is  gained of this.  This is then resected utilizing first a #63 blade on an  oscillating saw and then a pneumatic rasp.  Attention is directed to the  first interspace.  The approach to the interspace is through the original  skin incision.  A Weitlaner self-retaining retractor is utilized to aid in  exposure, Metzenbaum scissors utilized to carry the dissection down to the  fibular sesamoid.  An abductor hallucis tenotomy is then performed.  Attention is then directed  back to the first metatarsal.  A 0.045 K-wire is  introduced and utilized to act as a pin access guide for the apical segment  of the Reverdin bunionectomy.  With the K-wire in place, the first cut is  the plantar cut of the Reverdin, which parallels the weightbearing surface.  The osteotomy is begun at the K-wire and then exits plantarly about 2 cm  proximal to this.  The second cut is the first dorsal cut of the Reverdin,  which is the vertical cut which parallels the cartilage of the first  metatarsal.  This is a through-and-through cut made with a #63 blade  utilizing a sagittal saw.  A #64 blade, which is longer and slightly  narrower than the #63 blade, is utilized to complete the cut as the #63  blade is too short.  The second of the two dorsal cuts is the oblique cut to  the first, which creates a triangular wedge of bone with the apex of this  being lateral and the base of the bone being medial.   This is first created  with a #63 blade and then finished with the #64 blade with the sagittal saw.  The wedge of bone is removed.  The amount of shortening is appreciated  utilizing the XiScan and still found to be slightly too long.  A second pass  along the proximal aspect of the osteotomy is performed, removing further  bone.  The osteotomy is then closed down upon itself and found to be of  adequate length.  The capital fragment is then relocated laterally about 30-  40% across the width of the first metatarsal surface.  With the osteotomy  closed down upon itself and relocated laterally, this is then fixated  utilizing two 0.045 K-wires driven in a divergent fashion.  The osteotomy is  deemed stable.  Care is taken that there is no redundant protrusion of the K-  wires through the articular surface.  This is both imagined clinically and  visualized radiographically utilizing the XiScan.  The toe is put through a  range of motion and found to be freely movable with no penetration of the K-  wires into the sesamoid apparatus or soft tissue.  The redundant bone  laterally is then excised utilizing the #63 blade on the oscillating saw,  and the entire medial aspect is then feathered utilizing the rasp.  The  wound is closed with copious amounts of irrigant.  K-wires  are bent close  to the metatarsal surface, cut and then rotated so that they lay flush.  The  wound is flushed with copious amounts of irrigant.  The capsule is  reapproximated and an approximately 2-3 mm wedge of redundant medial joint  capsule is excised.  The capsule is then reapproximated and closed utilizing  a combination of simple interrupted sutures of 3-0 Vicryl and a running  horizontal mattress suture dorsally of 3-0 Vicryl.  Subcutaneous tissue is  then reapproximated and closed with 4-0 Vicryl in a running horizontal  mattress suture.  The skin is closed in a subcuticular suture of 4-0 Vicryl. Steri-Strips are  applied across the wound.  A postoperative injection of  Marcaine and Hexadrol is dispensed.  A Betadine-soaked Adaptic dressing and  a dry sterile compressive dressing follows.  The tourniquet is then deflated  with tourniquet time for this particular case spanning roughly 70 minutes.   Ms. Lupinacci tolerates the anesthesia and procedure well.  She is transported  to day  hospital without incident.  While there, a list of written  instructions was explained to her.  These instructions are also explained to  her husband.  A prescription for Lortab 10/650 mg and Phenergan 25 mg is  written and dispensed.  She will be seen in 6 days in the [place] office for  her first postop visit.           ______________________________  Denny Peon. Ulice Brilliant, D.P.M.     CMD/MEDQ  D:  09/24/2005  T:  09/25/2005  Job:  [id number]

## 2010-05-31 NOTE — Op Note (Signed)
[facility]  Patient:    [name], [name] [facility]                    MRN: [id number] Proc. Date: 05/10/99 Adm. Date:  [id number] Attending:  Ollen Gross V                           Operative Report  PREOPERATIVE DIAGNOSIS:  Failed left total hip arthroplasty.  POSTOPERATIVE DIAGNOSIS:  Failed left total hip arthroplasty.  OPERATION PERFORMED:  Left total hip arthroplasty revision.  SURGEON:  Trudee Grip, M.D.  ASSISTANT:  Illene Labrador. Aplington, M.D.  ANESTHESIA:  General.  ESTIMATED BLOOD LOSS:  200 cc.  DRAINS:  Hemovac x 1.  COMPLICATIONS:  None.  CONDITION:  Stable to recovery.  INDICATIONS FOR PROCEDURE:  The patient is a 45 year old female who has a dysplastic hip which was treated with total hip arthroplasty approximately eight ot nine years ago.  She had an acetabular revision in 1995.  She has had progressive groin pain with a loose acetabular component.  She a HA backed Dual Geometry cup which loosened with a lot of associated lysis.  She presents now with acetabular revision.  Her femoral stem looked excellent.  DESCRIPTION OF PROCEDURE:  After successful administration of general anesthetic, the patient was placed in right lateral decubitus position with the left side up and held with hip positioner.  The left lower extremity was isolated from the perineum with plastic drapes and then prepped and draped in the usual sterile fashion.  The previous posterolateral incisions were utilized.  Skin cut with a 10 blade through the subcutaneous tissues to the level of the fascia lata which was incised in line with the skin incision.  The soft tissue sleeve was then elevated posteriorly off of the femur and capsulectomy performed posteriorly.  The sciatic nerve previously was palpated and protected.  The femoral head and stem were identified and the hip was dislocated.  The femoral head was removed with the bone tamp.  The trunion of  the femoral stem is protected and the anterior capsule removed and the femur was translated anteriorly with the stem being just anterior to the acetabulum.  Soft tissue exposure was completed to show the whole rim. e attempted to remove the liner but was unsuccessful but the whole cup was rocking and we easily removed the entire acetabular shell without any bone loss.  She had a central cavitary defect as well as an anterior wall defect.  Both columns were intact and there was no evidence of discontinuity.  At this point the membrane as removed from the bone and reaming was initiated.  A size 15 and 52 were used to  clean the central area. She was essentially down to pelvic membrane.  The column was then reamed up to a size 58.  It was decided to use a 60 DuraLock cup.  The  femoral head was then morselized and mixed with 10 cc of Grafton putty and then the entire graft combination was placed down into the base of the defect and also on the anterior column.  The size 60 DuraLock sector cup was then impacted into the acetabulum.  She had a very good rim fit.  This was augmented with two dome screws for a very stable cup.  The trial 28 mm 10 degree liner was placed in the high all at the 2 oclock position.  Trial plus 5 Osteonics head  was placed onto the Osteonics stem and then hip reduced.  She had excellent stability, full extension, full external rotation and 70 degrees of flexion, 40 degrees adduction and had 0 degrees internal rotation.  That was impinging on soft tissue anteriorly.  This was removed and she was able to get 70 degrees internal rotation.  She also was able to be flexed greater than 90.  At this point the trial was removed and an Apex hole eliminator was placed into the acetabular shell.  The permanent size 60 28 mm 10 degree Marathon liner was then impacted into the shell.  The +5 trial head was sed and once again there was excellent stability.  Soft  tissue tension was excellent also.  The +5 28 mm C-taper head was then impacted onto the femoral stem and reduction performed with excellent stability.  The wound was copiously irrigated with antibiotic solution and soft tissue reattached to the femur posteriorly with #1 Ethibond.  Fascia lata was then closed over limb of the Hemovac drain with interrupted #1 Vicryl.  The subcutaneous was closed in two layers with #1 and 2-0 Vicryl.  The skin was closed with staples.  Drains hooked to suction.  Incision  clean and dry.  A bulky sterile dressing applied.  The patient was then placed supine on the bed, awakened and transported to recovery in stable condition. DD:  05/10/99 TD:  05/13/99 Job: [id number]

## 2010-05-31 NOTE — Consult Note (Signed)
[place]  Patient:    [name], [name] [facility] Visit Number: [id number] MRN: [id number]          Service Type: MED Location: 1800 [id number] [id number] Attending Physician:  Carmelina Peal Dictated by:   Genene Churn. Love, M.D. Admit Date:  10/12/2000                            Consultation Report  DATE OF BIRTH:  03-Feb-1965  REASON FOR CONSULTATION:  This 45 year old right-handed married white female is seen at the request of Dr. Dorna Bloom in the emergency room for evaluation of seizures x 2.  HISTORY OF PRESENT ILLNESS:  The patient has a history of left hip replacements at the age of 33, age 26 and age 42 for congenital hip disease. She has had chronic pain syndrome and been on Darvocet-N 100, Fioricet, Celebrex, Relafen and, intermittently, Arthrotec for this.  She states that she has not been taking her Darvocet-N 100.  She has been taking approximately two Fioricet procedure day, but she has also been taking Ultram about two per day.  For depression, she has been on Prozac 20 mg q.d.  On the day of admission, she was at the doctors office and without warning, fell, having a generalized major motor seizure.  She was seen in the emergency room and had a second generalized major motor seizure.  The first seizure occurred after she had taken two Ultram.  She has not had any history of drug or alcohol abuse or documented history of head trauma other than the episode with her seizure.  PAST MEDICAL HISTORY: 1. Headaches. 2. Congenital left hip disease. 3. Left hip replacement x 3. 4. Arthroscopic knee debridement.  SOCIAL HISTORY:  She does not drink alcohol or smoke cigarettes.  She lives with her husband and has two children.  She has been on medication as listed above.  PHYSICAL EXAMINATION:  GENERAL:  Well-developed white female who is lethargic and postictal.  VITAL SIGNS:  Blood pressure in the right and left arm was 140/80, heart rate 64 and regular.   She was afebrile with a temperature of 98.2.  NECK:  No carotid or supraclavicular bruits heard.  Flexion and extension maneuvers were unremarkable.  MENTAL STATUS:  Alert and oriented to person and to place, but not to situation, year or month.  She would follow commands slowly.  CRANIAL NERVES:  Visual fields full.  Disks flat.  Extraocular movements full.  Corneals present.  Right ptosis.  No seventh nerve palsy.  Hearing present. Sternocleidomastoid and trapezius testing intact.  MOTOR:  Strength is the upper and lower extremities was 5/5.  SENSORY:  Intact to pinprick, touch, general position and vibration.  Deep tendon reflexes 3+.  Plantar responses down going.  She had two-point discrimination throughout ______ which were intact.  LABORATORY DATA:  CBC with white blood cell count 6800, hemoglobin 12.1, hematocrit 35.7, platelets 268,000.  Sodium 139, potassium 3.6, chloride 108, CO2 content 24, BUN 4, creatinine 0.7, glucose 126.  Liver function tests were normal.  IMPRESSION:  Seizures x 2 (345.10).  Consider drug-related seizures in view of Ultram and Fiorinal in association with Prozac and a history of Codeine allergy for precipitance for generalized seizure.  The plan at this time, however, is to obtain MRI study of the brain.  I agree with loading her with Dilantin and will follow her with you in the hospital on  seizure precautions. Dictated by:   Genene Churn. Love, M.D. Attending Physician:  Carmelina Peal DD:  10/12/00 TD:  10/12/00 Job: [id number]

## 2010-05-31 NOTE — H&P (Signed)
   [name], [name] [facility]                       ACCOUNT NO.:  [id number]   MEDICAL RECORD NO.:  [id number]                   PATIENT TYPE:  INP   LOCATION:  3735                                 FACILITY:  [facility]   PHYSICIAN:  Lilla Shook, M.D.            DATE OF BIRTH:  1965-02-24   DATE OF ADMISSION:  03/18/2002  DATE OF DISCHARGE:  03/19/2002                                HISTORY & PHYSICAL   CHIEF COMPLAINT:  At 4:25, she began seizure activity involving arms and  legs repeatedly, having episodes that were unresolved by Xanax 1 mg  sublingually as they usually are.  We decided to admit for observation.   HISTORY OF PRESENT ILLNESS:  There is a history of seizure disorder  complicated by Depression. She was seen by [name].  She complained of  fatigue and headache yesterday.  She had poor sleep that night due to water  heater problems at home.  She complained of increasing depression.  She was  observed having seizure activity starting at 4:25 with multiple episodes of  shaking of arms and legs and unable to recognize familiar faces.   PAST MEDICAL HISTORY:  1. Seizure disorder with pseudoseizure.  2. Headache.  3. Depression.  4. Anxiety.  5. DJD with congenital hip dysplasia.  6. Reflux.   PAST SURGICAL HISTORY:  1. Hip replacement, left, 1993, 1995, and 2001.  2. C section twice.   ALLERGIES:  VICODIN, MORPHINE, CODEINE, PENICILLIN, CELEBREX, RELAFEN,  VIOXX.   MEDICATIONS:  1. Zonegran 100 mg 4 q.h.s.  2. Xanax XR 2 mg q.h.s.   SOCIAL HISTORY:  She does not drink or smoke.   FAMILY HISTORY:  Noncontributory.   REVIEW OF SYSTEMS:  Not obtainable.   PHYSICAL EXAMINATION:  GENERAL:  Appearance is obtunded.  She did respond to  ammonia ampule, though.  VITAL SIGNS:  Unable to get blood pressure due to seizure activity.  No  temperature taken.  Pulse ranged from 84 to 120 beats per minute.  HEENT:  Pupils are dilated.  Oropharynx was dry.   LABORATORY  DATA:  CK 121.  We are awaiting prolactin level.    ASSESSMENT:  Seizure disorder with pseudoseizure.   PLAN:  We would like to admit for observation.     Chales Abrahams Placey, N.P.                     Lilla Shook, M.D.    MAP/MEDQ  D:  03/18/2002  T:  03/19/2002  Job:  [id number]

## 2010-05-31 NOTE — Op Note (Signed)
[name], [name]               ACCOUNT NO.:  [id number]   MEDICAL RECORD NO.:  [id number]          PATIENT TYPE:  AMB   LOCATION:  DAY                           FACILITY:  [facility]   PHYSICIAN:  Cody M. Ulice Brilliant, D.P.M.  DATE OF BIRTH:  [birth year]   DATE OF PROCEDURE:  04/23/2005  DATE OF DISCHARGE:                                 OPERATIVE REPORT   PREOPERATIVE DIAGNOSIS:  Recurrent hallux abductovalgus deformity, right  foot.   POSTOPERATIVE DIAGNOSIS:  Recurrent hallux abductovalgus deformity, right  foot.   PROCEDURE PERFORMED:  Reverdin osteotomy, first metatarsal, right foot.   SURGEON:  Denny Peon. Ulice Brilliant, D.P.M.   ANESTHESIA:  Monitored anesthesia care.   INDICATIONS FOR SURGERY:  Painful longstanding hallux abductovalgus  deformity of both feet.  Previous surgical procedure performed by  orthopedist in [place], consisting basically of excision of the medial  eminence, which has left the patient with still a recurring HAV deformity  and less bone stock, in terms of bone width to work.  The patient is noted  to have clinical prominent HAV with great toe abutting second toe.  Medial  eminence is still present and uncomfortable.   DESCRIPTION OF PROCEDURE:  [name] is brought into the OR and placed in a  supine position.  IV sedation is established.  A Mayo block is performed  about the first MTP of the right foot.  Utilizing a 1:1 mixture of lidocaine  and Marcaine.  A pneumatic ankle tourniquet is then applied the right ankle.  [name] foot is prepped and draped in the usual aseptic fashion.  An Ace bandage  was utilized to exsanguinate [name] foot.  The tourniquet is inflated to 250  mmHg.   PROCEDURE:  Reverdin osteotomy of first metatarsal, right foot.  Attention  is directed to the first MCP of the right foot.  A 7 cm curvilinear skin  incision is made.  The incision is deepened by sharp and blunt dissection  through to the subcutaneous tissues.  Care is taken to get good  access and  exposure to the medial aspect of the joint capsule.  An inverted L  capsulotomy is performed.  The capsular and periosteal tissues are reflected  away from the first metatarsal.  With exposure of the cartilage complete,  the articular surface is noted to lie in a position of lateral deviation.  At this point, it is determined that we are going to need to go ahead and  perform a Reverdin bunionectomy instead of an Merkel, to try to correct for  an abnormal PASA angle.  The first interspace is approached through the  original skin incision.  A Weitlaner self-retaining retractor was utilized  to aid in exposure.  An adductor hallucis tenotomy is then performed.  Attention is then directed back to the first metatarsal.  A 0.045 K wire is  driven at the apex of the Reverdin osteotomy.  With the K wire in place, the  planar cut of the Reverdin is made.  The first dorsal cut is then made, both  cuts being made with a #63 blade  on a saggital saw.  The second cut is then  made dorsally at an oblique angle to the first with the apex of the angle  being lateral, and the base of the angle being medial.  The wedge of bone is  then removed, and the osteotomy is closed down upon itself.  This is then  visualized on the [facility], and it is noted that we still could remove further  bone to reduce length and correct further for PASA.  A second cut is then  made, removing yet again, another wedge of bone, further cutting down on the  PASA deviation.  The capital fragment, in addition to being rotated  medially, is then transposed laterally to cut down on the IM angle.  This is  then fixated via 2.045 K wires driven in a slightly divergent fashion.  The  osteotomy is deemed stable.  The amount of correction is visualized on the  [facility].  With the relative narrow first metatarsal as what was left from the  previous surgery, it is felt that this is about as much IM correction as we  can achieve and  still have a relatively stable first metatarsal.  With the  bone work complete, the redundant bone medially is excised, utilizing the  63, then a 65 blade on the oscillating saw.  The newly formed osseus surface  is rasped smooth.  The K wires are bent closed to the osseus surface, cut,  and then rotated so that they lay relatively flush.  The wound is flush with  copious amounts of irrigant.  The toes put through a range of motion and  found to be freely moveable without restriction of K wire.  The osteotomy is  deemed relatively stable.  Capsular tissues are then reapproximated, and an  approximately 3-4 mm wedge of redundant medial joint capsule was excised.  The toe is then held in a rectus position, and the capsular tissues are  reapproximated and closed.  Subcutaneous tissues are then reapproximated and  closed.  The skin is then closed with 4-0 Vicryl in a subcuticular suture.  Steri-Strips are applied across the incision.  A postoperative injection of  Marcaine and Hexadrol is dispensed.  A Betadine-soaked Adaptic dressing and  dry sterile compressive dressing follows.  The tourniquet is deflated with  tourniquet time spanning 59 minutes.  Postoperative ZySCAN images are  obtained and then printed.   [name] tolerated the anesthesia and procedure well.  She is transported  to day hospital without incident.  While there, a list of written  instructions will be explained to [name].  A size small CAM Walker is dispensed  as well as a Darco surgical shoe.  Two postoperative prescriptions are  written.  Lortab 10/650, dispense 30 1 p.o. q.4-6h. p.r.n. and Phenergan 25  mg 1 p.o. q.6h. p.r.n. nausea.  Instructions on elevation and ice.  Limited  walking in the CAM walker.  We will see [name] back in six days for first  postop visit in the [place], [place] office.           ______________________________  Denny Peon. Ulice Brilliant, D.P.M.    CMD/MEDQ  D:  04/23/2005  T:  04/23/2005  Job:   [id number]

## 2010-05-31 NOTE — Discharge Summary (Signed)
[name], [name]               ACCOUNT NO.:  [id number]   MEDICAL RECORD NO.:  [id number]          PATIENT TYPE:  IPS   LOCATION:  0508                          FACILITY:  [facility]   PHYSICIAN:  Geoffery Lyons, M.D.      DATE OF BIRTH:  07-18-1965   DATE OF ADMISSION:  02/03/2004  DATE OF DISCHARGE:  02/06/2004                                 DISCHARGE SUMMARY   CHIEF COMPLAINT AND PRESENT ILLNESS:  This was the first admission to [facility] for this 45 year old female that was admitted after  an intentional overdose.  Claimed to have been done impulsively.  Has been  depressed.  Has been out of work.  Long-term disability due to seizures.  Under the care of [name].  Recently had a two-month time frame where she  had been seizure-free.  Apparently, her 73 year old son disclosed that he is  the father of a baby by his 68 year old girlfriend.  This caused the patient  to become overwhelmed.  She felt she just snapped.  Overdosed on her own  medications.   PAST PSYCHIATRIC HISTORY:  In 2002, she had a grand mal seizure while  working at [employer] at the [employer].  She has been seeing a  therapist, Zenia Resides.   ALCOHOL/DRUG HISTORY:  Denies the use or abuse of any substances.   MEDICAL HISTORY:  Seizures.   MEDICATIONS:  Keppra, Lamictal 250 mg twice a day, Cymbalta 30 mg daily.   PHYSICAL EXAMINATION:  Performed and failed to show any acute findings.   LABORATORY DATA:  Glucose 81, hemoglobin A1C 5.4.  TSH 3.733.  Urinalysis  within normal limits.   MENTAL STATUS EXAM:  Alert, cooperative, middle-aged female, casually  groomed and dressed.  Eye contact was appropriate.  Speech was normal in  rate, rhythm and tone.  Mood was depressed.  Affect was appropriate.  Thought processes were logical, coherent and relevant.  Endorsed that she  was wanting to get better and go home.  No delusions.  No suicidal or  homicidal ideation.  No hallucinations.   Cognition was well preserved.   ADMISSION DIAGNOSES:   AXIS I:  Depressive disorder not otherwise specified.   AXIS II:  No diagnosis.   AXIS III:  1.  Seizure disorder.  2.  Status post hip replacement.   AXIS IV:  Moderate.   AXIS V:  Global Assessment of Functioning upon admission 30; highest Global  Assessment of Functioning in the last year 60.   HOSPITAL COURSE:  She was admitted and started in individual and group  psychotherapy.  She was maintained on Ambien 10 mg at bedtime for sleep,  Cymbalta 30 mg twice a day, Lamictal 300 mg twice a day, Keppra 250 mg twice  a day.  Lamictal was placed at 250 mg twice a day.  She endorsed she got off  medications.  She was not taking her Cymbalta because of increase of her  Cymbalta.  Became increasingly more and more depressed, very frustrated due  to her seizures.  She has not been able to  drive.  Endorsed a lot of stress,  overwhelmed, dealing with certain set of circumstances.  Family session with  her husband.  Endorsed that her main stressor was not being employed,  refusing to have him and baby admit to DNA test, 69 year old son with legal  problems, diagnosed ADHD, using drugs.  Overall, she was feeling better.  Husband said that she was at her baseline.  On January 24th, she was in full  contact with reality.  Endorsed no suicidal ideation, no homicidal ideation,  no hallucinations, no delusions.  Good session with the husband.  They were  able to identify the issues they needed to work with.  They were committed  to do it.  Evidence of increased insight, increased coping skills and stress  management.  We went ahead and discharged to outpatient followup.   DISCHARGE DIAGNOSES:   AXIS I:  Depressive disorder not otherwise specified.   AXIS II:  No diagnosis.   AXIS III:  1.  Seizure disorder.  2.  Total hip replacement.   AXIS IV:  Moderate.   AXIS V:  Global Assessment of Functioning upon discharge, 50.    DISCHARGE MEDICATIONS:  1.  Cymbalta 60 mg per day.  2.  Keppra 250 mg twice a day.  3.  Lamictal 100 mg, 1-1/2 twice a day.  4.  Ambien 10 mg at bedtime for sleep.   FOLLOW UP:  __________.      IL/MEDQ  D:  03/05/2004  T:  03/05/2004  Job:  [id number]

## 2010-05-31 NOTE — Discharge Summary (Signed)
[facility]  Patient:    [name], [name] [facility]                    MRN: [id number] Adm. Date:  [id number] Disc. Date: [id number] Attending:  Loanne Drilling Dictator:   Alexzandrew L. Perkins, P.A.-C.                           Discharge Summary  ADMITTING DIAGNOSES: 1. Osteolysis and loosening acetabular component. 2. Status post left total hip replacement arthroplasty.  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  PROCEDURE:  Patient was taken to the OR on May 10, 1999.  Underwent a left total hip replacement arthroplasty per Dr. Ollen Gross with Dr. Marlowe Kays assisting.  Surgery done under general anesthesia.  CONSULTS:  None.  BRIEF HISTORY:  Patient is a 45 year old female who has been seen for [name] for a concerning problems about her left hip.  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  She underwent a total hip replacement back in 1993 with a subsequent revision in 1995.  She has done fairly well over the past several years; however, recently she has had increasing pain, cramping, and shortening of the lower extremity.  It was thought early on, perhaps, that she had some trochanter bursitis due to her ambulation pattern; however, injection of the trochanter did not relieve her discomfort.  X-rays shown on follow-up showed definite lysis and loosening around the acetabular shelve of the left total hip arthroplasty.  Due to the findings it was felt she would benefit from undergoing revision.  She is subsequently admitted to [facility].  LABORATORIES:  CBC on admission:  Hemoglobin 12.9, hematocrit 38.1, white cell count 7.4, red cell count 4.12.  Postoperative hemoglobin down to a level of 11.3 with hematocrit 34.3.  Last noted H&H was 11.3 and 33.2 respectively. PT/PTT on admission were 13.8 and 32 respectively.  Serial protimes followed per  Coumadin protocol.  Last noted PT and INR prior to discharge was 20.6 with an INR of 2.4.  BMET on admission all within normal limits.  Urinalysis on admission was negative.  Blood group type A+.  Urine culture taken on May 12, 1999:  No growth.  X-rays:  Preoperative x-ray on May 10, 1999 of the left hip showed 10 cm marker present for measurement purposes, left femoral acetabular components in good position.  Postoperative hip film shows left total hip arthroplasty with femoral component in good position.  HOSPITAL COURSE:  Patient admitted to [facility], taken to OR and underwent above said procedure without complication.  Patient tolerated the procedure well.  Was later discharged to the recovery room and then to the orthopedic floor to continue postoperative care.  Patient had one Hemovac drain placed at the time of surgery.  This was pulled on postoperative day one.  She was placed on p.c. analgesics for pain control following surgery. H&Hs were followed very closely throughout the hospital course.  May be found in laboratory data section of this chart as above.  Dressing change was initiated on postoperative day two.  Patient used the knee immobilizer for hip precautions while in bed.  Patient underwent physical therapy postoperatively, assisted with gait training and ambulation.  She was up ambulating approximately 40 feet by postoperative day two, doing well.  Discharged planning arranged for home care.  She continued to  progress with physical therapy.  She was weaned over to p.o. analgesics and by postoperative day five she was doing quite well, ambulating well, and it was decided for patient to be discharged home.  DISCHARGE PLAN:  Patient is discharged home on May 15, 1999.  DISCHARGE DIAGNOSES:  Same as above.  DISCHARGE MEDICATIONS:  Imiprigan Fortis p.r.n. pain, Robaxin p.r.n. spasm, Coumadin as per pharmacy protocol, ______.  DIET:  As  tolerated.  ACTIVITY:  Touch-down weight-bearing.  Hip precautions at all times.  FOLLOW-UP:  Two weeks.  DISPOSITION:  Home.  CONDITION ON DISCHARGE:  Improved. DD:  06/21/99 TD:  06/27/99 Job: [id number]

## 2010-05-31 NOTE — Discharge Summary (Signed)
[facility]  Patient:    [name], [name] [facility] Visit Number: [id number] MRN: [id number]          Service Type: MED Location: 3000 [id number] Attending Physician:  Eliezer Champagne Dictated by:   Wayne C. Dorna Bloom, M.D. Admit Date:  10/12/2000 Discharge Date: 10/13/2000   CC:         Genene Churn. Love, M.D.   Discharge Summary  ADDENDUM:  I had discharged this patient on Tegretol 200 mg b.i.d. However, today on October 14, 2000, I spoke with Genene Churn. Love, M.D.  He had read the EEG and thought it was markedly abnormal.  It had a seizure focus and he was surprised that the patient, although she had denied it, had any family history of seizures.  He recommended an alternative medicine.  He recommended that we put her on Depakote extended release 500 mg q.d.  Side effects could include weight gain, hair loss, or tremors.  She should then see [name] in about three weeks and at that time probably get a CBC, CMET and Depakote level checked.  The patient was fully understanding and informed about this and a prescription was written for this.  Finally, regarding driving restrictions, the patient is instructed that she is not allowed to drive until further notice.  This will be advised by [name] as well.  In addition, between writing the patients prescriptions and handling the change in medicines, discussing the matter with the physicians, dictating the discharge summaries, writing my note and instructing the patient, I spent well over 30 minutes completing all these tasks today. Dictated by:   Wayne C. Dorna Bloom, M.D. Attending Physician:  Eliezer Champagne DD:  10/16/00 TD:  10/16/00 Job: [id number]

## 2010-05-31 NOTE — H&P (Signed)
[place]  Patient:    [name], [name] [facility] Visit Number: [id number] MRN: [id number]          Service Type: MED Location: 3000 3003 [id number] Attending Physician:  Erich Montane Dictated by:   Deanna Artis. Sharene Skeans, M.D. Admit Date:  11/21/2000   CC:         Wayne C. Dorna Bloom, M.D.  Genene Churn. Love, M.D.   History and Physical  DATE OF BIRTH:  03/10/1965  CHIEF COMPLAINT:  Seizures.  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  She had the onset of generalized tonic clonic seizure at work October 12, 2000 that was associated with tongue biting and striking her forehead as she fell suddenly.  She did not loose continence of bowel or bladder.  She was admitted to [facility] after she had a second episode of generalized seizures in the emergency room.  She was seen by Dr. Avie Echevaria.  He noted normal neurologic examination except for postictal changes.  She was loaded with Dilantin.  MRI scan of the brain was normal.  EEG was not described in the chart and is not on the chart or in our office chart or on the EEG computer.  She was stated by [name] in the office note and also by an addendum by Dr. 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  Initially, the plan was to transition the patient from Dilantin to Tegretol but this was changed to Depakote ER.  Level on office visit on 10/22/00 was 38 mc per ml.  History was obtained that the patient had had staring spells witnessed by her mother "for all of her life."  More recently, co-workers noticed that she had staring spells that interrupted her speech.  On the basis of this, Depakote ER was increased to two in the morning.  She had witnessed seizure at work on November 7 and the dose was increased to 3 in the morning.  Level obtained after increase to  500 was 81 mc per ml which is the same as the level obtained tonight.  Around 10:00 a.m. on November 9, the patient had onset of a seizure and had at least two more in the evening.  We dont know if any occurred in the afternoon.  She was brought to the Emergency Room a [facility] at 2224 hours. She had several more seizures witnessed by family members but none by the ER physician.  One was witnessed by an ER nurse who noted rhythmic jerking of her right arm and leg.  The patient made eye contact with the nurse and followed her command to straighten her right arm out for blood pressure measurement.  I was called at 3:28 a.m. by Dr. Carleene Cooper.  We discussed her case through review of the chart.  He spoke with the nurse taking care of the patient who had not recognized a pseudoseizure.  The patient asked that she not be sent home because of fear over her condition.  I agreed to come to admit the patient.  On arrival, within one to two minutes, the patient had a 30 second episode of rhythmic forced jerking of her right arm and leg with her eyes straight ahead. This was not typical seizure.  I asked her to look at me and she briefly did and  then brought her eyes back to midline.  She had no salivation, no tongue biting or incontinence and made a rapid transition to an alert but responsive state.  At the end of her episode, the patient whined and cried briefly and had brief hyperventilation.  She was immediately calmed by her husband.  My interpretation of this was that the behavior was a pseudoseizure.  PAST MEDICAL HISTORY: 1. 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 2. Anxiety, depression and fatigue. 3. Muscle contraction headaches. 4. Gastroesophageal reflux disease. 5. Eczema.  PAST SURGICAL HISTORY: 1. Three total knee replacements in March of 1993, March of 1995 and April 2001, the latter associated with the bone graft. 2. Right knee  arthroscopy. 3. Cesarean section x 2.  REVIEW OF SYSTEMS:  The patient has had a cough but no other recent illnesses, fever, no bleeding dyscrasias, no head injury, no GI, GU or reproductive symptoms, no chest pain, hypertension, no respiratory distress, no new neurologic deficits.  The patient has had trouble sleeping.  CURRENT MEDICATIONS: 1. Zoloft 50 mg per day. 2. Nexium 40 mg per day. 3. Depakote ER 1500 mg in the morning, (500 x 3).  ALLERGIES:  CODEINE, VICODIN, MORPHINE SULFATE, PENICILLIN AND TYLENOL #3 have caused HIVES.  There is also mentioned by [name] in the chart that codeine may have induced a seizure.  I have not been able to find that elsewhere.  The patient was taking ULTRACET just before she had her first seizure so lists that as an intolerance.  She is also intolerant with GI upset to CELEBREX, VIOXX, RELAFEN.  FAMILY HISTORY:  Positive for heart disease, hypertension, diabetes mellitus (grandmother), lymphoma in a first cousin who died at age 70, breast cancer in an aunt at age 24, no history of cerebrovascular disease or other neurologic disease especially seizures.  SOCIAL HISTORY:  The patient does not use tobacco or alcohol.  She is having trouble with her teenage sons, particularly one.  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 She drinks four cups of coffee per day.  PHYSICAL EXAMINATION:  VITAL SIGNS:  Temperature 98.6, blood pressure 110/80, resting pulse 90, respirations 20, pulse oximetry 98%.  HEENT:  Ear, nose and throat, no signs of infection or tongue biting.  LUNGS:  Clear.  HEART:  No murmurs.  Pulses are normal.  ABDOMEN:  Protuberant, no hepatosplenomegaly.  Nontender and bowel sounds are normal.   EXTREMITIES:  Scarring of the left hip with slight decreased range of motion.  NEUROLOGIC:   Awake and alert, subdued in no acute distress.  Cranial nerves revealed round and reactive pupils.  Fundi are normal.  Visual fields are full to double simultaneous stimuli, OK and response equal.  Symmetric facial strength, midline tongue and uvula.  Air conduction greater than bone conduction bilaterally.  MOTOR:  Examination revealed normal strength, tone and mask and fine motor movements.  No pronator drift.  Sensation is intact to cold, vibration and stereognosis.  Also, she had normal proprioception.  Cerebellar revealed good finger-to-nose movements.  Gait was antalgic because of her left hip. Otherwise, she can get up on her heels and toes and perform tandem.  Deep tendon reflexes were normal.  She had bilateral flexor plantar responses.  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  Tonight the witnessed episodes seem more consistent with witnessed episode that I saw and one of the nurses saw seemed to be more consistent with pseudoseizures.  PLAN: 1. Admit. 2. Saline lock. 3. Nasal ammonia for seizures. 4. Give Xanax to calm the patient and help her sleep. 5. Will repeat EEG on Monday. 6. No change in medications for now. 7. I will discuss this case with [name] on Monday.Dictated by:   Deanna Artis. Sharene Skeans, M.D. Attending Physician:  Erich Montane DD:  11/22/00 TD:  11/22/00 Job: [id number]

## 2010-05-31 NOTE — H&P (Signed)
[name], [name]               ACCOUNT NO.:  [id number]   MEDICAL RECORD NO.:  [id number]          PATIENT TYPE:  AMB   LOCATION:                                FACILITY:  [facility]   PHYSICIAN:  Denny Peon. Ulice Brilliant, D.P.M.  DATE OF BIRTH:  1965/09/18   DATE OF ADMISSION:  04/23/2005  DATE OF DISCHARGE:  LH                                HISTORY & PHYSICAL   PREADMISSION HISTORY AND PHYSICAL   HISTORY OF PRESENT ILLNESS:  Painful bilateral bunion deformities present  for years, has had previous surgical correction attempted by an orthopedic  surgeon in [place], both being performed at the same time.  Under-  correction was achieved.  She still had continued pain with these  deformities.   PAST MEDICAL HISTORY:  1.  Seizure disorder for which she is on disability.  2.  Three total hip replacements, all on the right side.   CURRENT MEDICATIONS:  Lamictal, Ativan, Keppra, and BuSpar.   ALLERGIES:  1.  PENICILLIN.  2.  SULFA.  3.  SENSITIVITY TO LATEX.  She cannot wear Latex gloves, but she is okay      being touched with Latex by medical staff.   Objectively, she is a friendly white female noted to have a relative limb  length discrepancy, right being 1.5 cm shorter than left.  She has a  residual HAV deformity.  Radiographs are taken and she is noted to have a  residual metatarsus adductus foot type.  She is also noted to have a long  first metatarsal.  Previous surgical correction has consisted basically of a  medial eminence resection of the first metatarsal, which was fairly  aggressive, leaving about 70% to 60% of the width of the first metatarsal  remaining at the distal aspect.  The patient still has a long first  metatarsal and increase in the ion angle, and the great toe is leaning  towards the second digit.   ASSESSMENT:  Hallux abductovalgus deformity bilaterally, right bothers her  worse than left.   PLAN:  Surgical correction will consist of a Capital-type osteotomy  at the  first metatarsal head.  Depending on the angulation of the articular surface  of the first metatarsal, we will either proceed with a shortening Eliberto Ivory-  type procedure or possibly a Reverdin-Green-Laird-type procedure to correct  the PASA, reduce IM angle, and shorten the first metatarsal.  I have  instructed Ms. Koeller that she will have internal fixation postoperatively  and that this  procedure will be a little bit more involved than what she has been through  previously.  She will have one corrected first and then the other.  She  would like to do the right one first.  She has participated in the  discussion about her postoperative recovery period, has read the consent  form, apparently understood and signed.  ______________________________  Denny Peon. Ulice Brilliant, D.P.M.     CMD/MEDQ  D:  04/21/2005  T:  04/21/2005  Job:  [id number]

## 2010-05-31 NOTE — Consult Note (Signed)
[place]  Patient:    [name], [name] [facility] Visit Number: [id number] MRN: [id number]          Service Type: MED Location: [phone] Attending Physician:  Mayra Neer Dictated by:   Marlan Palau, M.D. Proc. Date: 03/15/01 Admit Date:  03/15/2001   CC:         [facility]; [address]  Dr. Lovell Sheehan   Consultation Report  HISTORY OF PRESENT ILLNESS:  [name] is a 45 year old right-handed white female born 03/10/1965 with a history of seizures and has been followed through [facility] via [name] over the last eight or nine months.  This patient has had documented spike wave discharges on EEG study.  Although patient has had generalized seizure events, she seems to recover from these events very rapidly.  Patient has had an MRI scan of the brain today that was normal.  Patient comes in today with seven seizures while at work.  Has had another event in the emergency room and then the second event in the ER just a few moments ago.  Patient has generalized jerking.  The jerking will stop.  Again, this involves all fours.  Unassociated with tongue biting or bowel and bladder incontinence.  Patient will then have some more focal twitching and then the whole thing resolves after four to five minutes and patient returns to normal mental status immediately without any postictal period.  Patient has been on Topamax taking 100 mg at night and has been off of Depakote.  Patient also has episodes of staring on and off in the past, possibly felt related to seizure events.  Patient has had frequent seizures in the last several weeks.  The patient is being brought in for seizure management at this point.  PAST MEDICAL HISTORY: 1. History of seizure disorder with generalized epilepsy as above. 2. History of depression. 3. History of anxiety. 4. History of gastroesophageal reflux disease. 5.  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 6. History of cesarean section. 7. History of right knee arthroscopy in the past.  MEDICATIONS: 1. Topamax 100 mg q.h.s. 2. Klonopin 0.5 mg two q.h.s. 3. Prozac 40 mg daily.  ALLERGIES:  CODEINE, VICODIN, MORPHINE, SULFA DRUGS, PENICILLIN, TYLENOL, ULTRAM.  SOCIAL HISTORY:  Patient does not smoke or drink.  This patient is married, has two sons.  This patient lives in the [place] area.  FAMILY HISTORY:  Negative for seizures.  REVIEW OF SYSTEMS:  Notable for no fevers, chills.  Patient does note headache.  Has had problems with fatigue.  Denies any problems with focal numbness or weakness on the face, arms, or legs.  Has some hip problems on the left leg that are ongoing.  PHYSICAL EXAMINATION  VITAL SIGNS:  Blood pressure 113/70, heart rate 78, respiratory rate 20, temperature afebrile.  GENERAL:  This patient is a moderately obese white female who is alert and cooperative at the time of examination.  HEENT:  Head is atraumatic.  Eyes:  Pupils equal, round, react to light. Disks are flat bilaterally.  NECK:  Supple.  No carotid bruits noted.  RESPIRATORY:  Clear.  CARDIOVASCULAR:  Regular rate and rhythm without obvious murmurs or rubs noted.  EXTREMITIES:  Without significant edema.  NEUROLOGIC:  Cranial nerves as above.  Facial symmetry is present.  Patient notes good sensation to face to  pin prick and soft touch bilaterally.  Patient has good strength to facial muscles and the muscles to head turn and shoulder shrug bilaterally.  Patient has good strength on the upper and lower extremities.  Has good finger-nose-finger, toe-to-finger.  Deep tendon reflexes are symmetric.  Normal toes.  Neutral bilaterally.  Pin prick, soft touch, vibratory sensation is intact on all fours.  LABORATORIES:  White count 6.2, hemoglobin 11.7, hematocrit 34.9, MCV  92.1, platelets 296,000.  Sodium 138, potassium 3.8, chloride 110, CO2 23, glucose 100, BUN 18, creatinine 0.8, calcium 8.3, total protein 6.3, albumin 3.7, AST 18, ALT 20, alkaline phosphatase 50, total bilirubin 0.6.  INR 1.1.  Again, MRI scan of the brain is unremarkable.  IMPRESSION:  History of primary generalized epilepsy with recurrent seizure events.  This patient has a normal neurologic examination at this point.  Patient has what are described as generalized jerking events involving face, arms, legs unassociated with tongue biting, bowel/bladder incontinence with immediate return to normal mental status right after the seizure event.  The description of these events, particularly with multiple seizures today totally over eight are highly suspect for true epilepsy.  Need to consider and rule out the possibility of both real and pseudoseizures in the same patient.  Patient is on subtherapeutic levels of Topamax.  PLAN: 1. Check EEG study. 2. IV depacon. 3. Readdition of valproic acid 500 mg q.8h. 4. Prolactin level to be done today after the seizure. 5. Will follow Depakote levels and patients neurologic status during this    hospitalization.  Patient may be an excellent candidate in the future for    video EEG monitoring. Dictated by:   Marlan Palau, M.D. Attending Physician:  Mayra Neer DD:  03/15/01 TD:  03/16/01 Job: [id number]

## 2010-05-31 NOTE — H&P (Signed)
[name], [name]               ACCOUNT NO.:  [id number]   MEDICAL RECORD NO.:  [id number]          PATIENT TYPE:  AMB   LOCATION:  DAY                           FACILITY:  [facility]   PHYSICIAN:  Cody M. Ulice Brilliant, D.P.M.  DATE OF BIRTH:  [birth year]   DATE OF ADMISSION:  DATE OF DISCHARGE:  LH                                HISTORY & PHYSICAL   HISTORY OF PRESENT ILLNESS:  Painful bunion deformity of left foot.  She has  previously had her right foot performed by myself in April of 2007 and has  done pretty well and would like to go ahead and get the left one performed.   PAST MEDICAL HISTORY:  1. Significant for seizure disorder.  2. Three previous total hip replacements, all on right side.   MEDICINES:  At this point, are Lamictal, Ativan, Keppra and BuSpar.   ALLERGIES:  INCLUDE PENICILLIN, SULFA AND SENSITIVITY TO LATEX.  SHE CAN BE  HANDLED WITH LATEX GLOVES BUT SHE DOES NOT DO WELL WEARING THEM HERSELF.  DID WELL WITH PREVIOUS SURGERY.   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  The great toe is abutting the second toe.  She  has got a pronounced medial eminence.  Radiographs are taken and she has had  a previous ostectomy procedure done with basically pretty aggressive removal  of the medial eminence.  She still, however, has non-resolved HAV pain.  The  IM angle is increased.  She is noted to have a residual metatarsus adductus  foot type.   ASSESSMENT:  Continued symptomatic hallux abductovalgus deformity, left foot  in spite of previous correction.   PLAN:  Surgery will consist of a shortening Reverdin type osteotomy of first  metatarsal.  We did this on her right foot and she did pretty well.  Will go  ahead and perform the same procedure on her left.  This will be done under  monitored anesthesia care.  I have described the procedure to Ar[place], her  usual postoperative course and the possibility of complications.  Complications greatly consist of with this case possibility of displacement  of the osteotomy postoperatively secondary to the decreased amount of  available bone for translocation of the osteotomy or capital fragment.  The  patient has read her consent form, apparently understood and signed.  We  have scheduled this for September 24, 2005 at [facility] under [facility].                                            ______________________________  Denny Peon. Ulice Brilliant, D.P.M.    CMD/MEDQ  D:  09/23/2005  T:  09/23/2005  Job:  [id number]

## 2010-05-31 NOTE — H&P (Signed)
[name], [name]               ACCOUNT NO.:  [id number]   MEDICAL RECORD NO.:  [id number]          PATIENT TYPE:  IPS   LOCATION:  0508                          FACILITY:  [facility]   PHYSICIAN:  Jeanice Lim, M.D. DATE OF BIRTH:  1965/02/10   DATE OF ADMISSION:  02/03/2004  DATE OF DISCHARGE:                         PSYCHIATRIC ADMISSION ASSESSMENT   This is a voluntary admission to the services of Dr. Aleatha Borer.  She is  admitted because of an intentional overdose yesterday that was done  impulsively.  Apparently she has been depressed, as she has been out of work  on longterm disability due to seizures.  Her first seizure was in 2003.  She  had grand mal as well as petit mal seizures.  She is under the care of Dr.  Sandria Manly for this and recently has had a 102-month timeframe where she has been  seizure free.  Apparently her 58 year old son disclosed that he is the  father of a baby by his 75 year old girlfriend.  This caused the patient to  become overwhelmed.  She states that she just snapped and overdosed on her  own medication, 28 Flexeril pills.  She ran out of her Cymbalta about a week  ago.  She states she has been sick.  Indeed her white blood cell count is  12.1.  There was blood in her urine, and her glucose is 121.   PAST PSYCHIATRIC HISTORY:  In 2002, she fell out with grand mal seizures  while working at [employer] the [employer], and she has been  seeing a Paramedic, Tami Ribas.   SOCIAL HISTORY:  She graduated high school.  She has had classes for coding.  She has not been employed since 2003.  She has been married once.  She has  two sons, one 66 and one 42.  The 47 year old son has been doing drugs and  has been difficult to manage his behavior.   FAMILY HISTORY:  There is none.   ALCOHOL AND DRUG HISTORY:  None.   PRIMARY CARE Lakethia Coppess:  She sees Birdena Jubilee, [place] at [facility] practice in [place], who has been  prescribing Cymbalta and  [name] for seizures.   She is currently maintained on Keppra and Lamictal 250 mg b.i.d. by [name]  and Cymbalta 30 mg p.o. q.d. by Birdena Jubilee.   DRUG ALLERGIES:  PENICILLIN makes her swell up, as does LATEX.   PHYSICAL EXAMINATION:  Was done in the emergency room.  Her gait is unusual  secondary to total hip replacements times three.  This is from  congenital  displaced, dislocated hips.   MENTAL STATUS EXAM:  She is alert and oriented times three.  She is casually  groomed and dressed.  Her gait, as already stated, is abnormal.  Her eye  contact is good.  Her speech is normal rate, rhythm and tone.  Her mood is  depressed.  Her affect is appropriate.  Her thought processes are clear,  rational and goal oriented.  She wants to get better and go home.  Judgment  and  insight are intact, and concentration and memory are intact.  She denies  substance abuse.  She denies suicidal or homicidal ideation today.  She  denies auditory or visual hallucinations.  She states that yesterday she  just became overwhelmed.  She feels like a burden due to her seizures.  She  cannot drive.  This has been very limiting for her.   ADMISSION DIAGNOSES:   AXIS I:  Depression and anxiety secondary to medical condition.   AXIS II:  Deferred.   AXIS III:  1.  Status post hip replacement times three.  2.  Seizure disorder since 2002.   AXIS IV:  Severe.   AXIS V:   The plan is to admit for stabilization and safety, to restart her Cymbalta  and to plan for appropriate discharge followup.      MD/MEDQ  D:  02/04/2004  T:  02/04/2004  Job:  [id number]

## 2010-05-31 NOTE — Discharge Summary (Signed)
[place]  Patient:    [name], [name] [facility] Visit Number: [id number] MRN: [id number]          Service Type: MED Location: [phone] Attending Physician:  Erich Montane Dictated by:   Genene Churn. Love, M.D. Admit Date:  11/21/2000 Disc. Date: 11/24/00   CC:         Dr. Deniece Portela [facility]   Discharge Summary  PATIENTS ADDRESS:  [address] [place] [address]  DATE OF BIRTH:  12/29/1965  HISTORY OF PRESENT ILLNESS:  This is one of several [facility] admissions for this 45 year old, right handed,  white married female from [place], [place] who works in medical records at [employer], and was seen in the emergency room for suspected seizures and admitted.  On October 12, 2000, this patient had a witnessed generalized major motor seizure characterized by tongue biting and striking her forehead when she fell suddenly while at work.  She did not have loss of bowel or bladder control. She was seen in the [facility] Emergency Room where a second generalized seizure was witnessed, and post ictally, she had a normal neurologic examination.  She was loaded with Dilantin.  MRI scan of the brain was normal, but EEG showed evidence of three and a half per second spike in wave discharges.  She was placed on Depakote ER 500 mg two per day with documented levels in the 80s.  Approximally one week prior to admission, on November 7th, she had had a witnessed seizure at work, and she as increased from 500 mg ER two per day to 500 ER three per day.  She was in her usual state of health until November 9th when she had the onset of a seizure and two more during the evening.  She was seen in the emergency room and had one episode witnessed by a nurse who felt that his could possibly nonepileptic seizure and was seen subsequently by Dr. Sharene Skeans and admitted.  There was no history of macropsy or micropsy, deja vu, strange  odors, or taste.  On arrival in the emergency room, Dr. Sharene Skeans witnessed a 30 second episode of rhythmic force jerking of her right arm and leg with eyes straight ahead that was no typical of a seizure.  During this seizure, she was able to look at Dr. Sharene Skeans.  There was no tongue biting or urinary incontinence.  Afterwards she cried briefly and had some hyperventilation.  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  She has had right knee arthroscopy in the past and cesarean section x 2.  She has had anxiety, depression, gastroesophageal reflux disease, and eczema.  SOCIAL HISTORY:  She is married and has teenage sons, one of whom has been a difficult problem for her.  She has been stressed at work in medical records. She does not use tobacco or alcohol.  FAMILY HISTORY:  Negative for a history of seizures.  ALLERGIES:  CODEINE, VICODIN, MORPHINE, SULFATE, PENICILLIN, AND TYLENOL NO.3 WITH CODEINE, and it is possible that ULTRACET was associated with increased frequency and lowered seizure threshold.  PHYSICAL EXAMINATION:  Her neurologic and general examination at the time of admission was reported by Dr. Sharene Skeans to be unremarkable.  LABORATORY DATA:  Sodium 143, potassium 3.4, chloride 109, CO2 29, glucose 117, BUN 15, creatinine 0.7, calcium 8.5, total protein 6.0, albumin 3.3, SGOT 17, SGPT  14, alkaline phosphatase 45, total bilirubin 0.4.  valproic acid level 81.1.  White blood cell count 8300, hemoglobin 11.9, hematocrit 33.9, platelet count 302,000.  Differential and white blood cell count was 49% polys, 38% lymphs, 10% monocytes, 3% eosinophils, and 1% basophils.  EEG on 11/23/00 showed evidence of 3.5 per second spike in wave discharges occurring intermittently throughout the record with a total of six or seven witnessed. These lasted approximately one to two seconds each.  HOSPITAL COURSE:   The patient was admitted for suspected seizures versus nonepileptic seizures.  Stress factors related by the patient and her husband in the hospital included her job and one of her teenage sons.  She had several episodes that were witnessed in which she would be quite alert and would have focal jerking of one arm or one leg.  It was felt that she had nonepileptic seizures.  It was reported by the patient that she had had a history of panic attacks, and that her Klonopin had been discontinued approximately two day prior to her admission which was thought to be a factor with her seizures. Her neurologic examination remained normal, and she had no seizures after 2 p.m. on 11/23/00 and it was felt that she maybe a candidate for discharge.  IMPRESSION:  1. Seizures, absence, myoclonic, and generalized with a history of absence and    myoclonic seizures prior to first generalized seizure on September 2002    secondary to primary generalized epileptic code 345.10. 2. 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 3. Anxiety code 300.00 with a history of panic attacks. 4. Depression code 311. 5. Congenital left hip dysplasia status post total hip replacement in March    1993, March 1995, and in the year 4/01 with bone graft code 719.45. 6. Status post right knee arthroscopy code 719.46. 7. Cesarean section x 2. 8. Gastroesophageal reflux disease.  PLAN:  Plan at this time is to place her on Klonopin this morning and watch her during the day.  Consider discharge this afternoon on Depakote ER 500 3 p.o. q.d., Klonopin 0.5 mg q.h.s., Zoloft 50 mg q.d., Nexium 40 mg q.d.  She is not to return to work until this coming Monday, and she is not to drive a car.  She will return to me for reevaluation on 11/27/00 in the office. Dictated by:   Genene Churn. Love, M.D. Attending Physician:  Erich Montane DD:  11/24/00 TD:  11/24/00 Job: [id number]

## 2010-05-31 NOTE — H&P (Signed)
[place]. [facility]  Patient:    [name], [name] [facility] Visit Number: [id number] MRN: [id number]          Service Type: MED Location: [phone] Attending Physician:  Carmelina Peal Dictated by:   Wayne C. Dorna Bloom, M.D. Admit Date:  10/12/2000                           History and Physical  CHIEF COMPLAINT:  Seizure.  HISTORY OF PRESENT ILLNESS:  This is a 45 year old white female without previous seizure history.  This a.m. around 10 collapsed, had full tonic-clonic movement.  She apparently bit tongue; no bowel or bladder incontinence.  Patient did hit her forehead on table.  This occurred again around 12 p.m. in the emergency room.  She was given Ativan 2 mg, began Dilantin load.  Now at 12:30 p.m. patient is waking up.  She did take two Ultracet this morning.  PAST MEDICAL HISTORY:  Includes chronic tension headaches; severe DJD with hip replacements; multiple congenital left hip dislocation; left total hip replacement March 1993 and March 1995 by Dr. 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  PAST SURGICAL HISTORY:  Includes hip replacements.  MEDICATIONS: 1. Ultracet one to two a day. 2. Darvocet p.r.n. - which she has not taken in a long time. 3. Fioricet p.r.n. - which apparently she still uses. 4. Zoloft 50 mg a day. 5. Klonopin 0.5 to 1 q.h.s. for insomnia. 6. Nexium 40 mg a day.  DRUG ALLERGIES:  VICODIN, and MORPHINE and CODEINE which causes hives. PENICILLIN causes hives.  She does, however, take Darvocet with no reaction. She does get GI upset with CELEBREX, RELAFEN, and VIOXX.  SOCIAL HISTORY:  The patient denies tobacco, alcohol, or illegal drug use. She is a native a [place], [place].  She has two teenaged sons.  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  REVIEW OF SYSTEMS:   The  review of systems I was able to obtain from the patient - patient has had headaches pretty frequently recently.  She has been taking Fioricet one every four hours.  She denies urinary problems, denies chest pain, denies shortness of breath, denies vision problems, denies throat pain, denies ear pain.  She denies abdominal pain but has had some constipation, has had her usual joint aches.  Constitutional-wise, she currently feels okay and wants to go home.  She also denies fevers.  PHYSICAL EXAMINATION:  GENERAL:  Patient is currently postictal but is waking up.  VITAL SIGNS:  Blood pressure 112/70, pulse 70s and regular, temperature 98.9.  HEENT:  Pupils are equal and round and reactive to light and accommodation. Funduscopic exam is benign.  Oropharynx shows tongue biting.  Patient does have a forehead indentation.  NECK:  Supple.  CARDIOVASCULAR:  Regular rate and rhythm without murmurs, rubs, or gallops.  LUNGS:  Clear to auscultation bilaterally.  ABDOMEN:  Soft, normoactive bowel sounds, nontender.  EXTREMITIES:  No clubbing, cyanosis, or edema.  SKIN: Intact, warm and dry.  NEUROLOGIC:  Patient is alert and oriented x 3.  Cranial nerves 2-12 intact. Motor exam is 5/5 in strength throughout.  Sensation intact to light touch and pinprick.  LABORATORY DATA:  Labs currently are pending.  ABG shows pH 7.158, PCO2 49.6, PO2 100.  MRI of the head is  pending.  EKG is pending.  ASSESSMENT AND PLAN: 1. New-onset seizures, question medication related with Ultram.  Patient    apparently has been taking Fioricet for headaches but narcotics not    recently prescribed by me.  We will give her Dilantin load, neurology    consult, check EEG.  Also, MRI of the head to evaluate for a primary    tumor which could be initiating seizures and causing headaches.  Also,    CBC, CMET, magnesium, urine drug screen, blood alcohol level. 2. Metabolic acidosis - probably postictal.  Discussed  with Dr. Melbourne Abts and    he did not recommend any bicarb needed; in fact, he stated that an    acidosis can protect against seizures.  We will recheck in the a.m.    Give ______  for now. 3. Reflux, currently well controlled - Nexium. 4. Chronic headaches, rule out seizure.  Again, check MRI. Dictated by:   Wayne C. Dorna Bloom, M.D. Attending Physician:  Carmelina Peal DD:  10/12/00 TD:  10/12/00 Job: [id number]

## 2010-07-15 ENCOUNTER — Emergency Department (HOSPITAL_COMMUNITY): Payer: Medicare Other

## 2010-07-15 ENCOUNTER — Encounter (HOSPITAL_COMMUNITY): Payer: Self-pay | Admitting: Radiology

## 2010-07-15 ENCOUNTER — Emergency Department (HOSPITAL_COMMUNITY)
Admission: EM | Admit: 2010-07-15 | Discharge: 2010-07-15 | Disposition: A | Discharge status: Home / Self Care | Payer: Medicare Other | Attending: Emergency Medicine | Admitting: Emergency Medicine

## 2010-07-15 DIAGNOSIS — R42 Dizziness and giddiness: Secondary | ICD-10-CM | POA: Insufficient documentation

## 2010-07-15 HISTORY — DX: Epilepsy, unspecified, not intractable, without status epilepticus: G40.909

## 2010-07-15 LAB — COMPREHENSIVE METABOLIC PANEL
ALT: 39 U/L — ABNORMAL HIGH (ref 0–35)
AST: 32 U/L (ref 0–37)
Albumin: 4.2 g/dL (ref 3.5–5.2)
Alkaline Phosphatase: 46 U/L (ref 39–117)
BUN: 11 mg/dL (ref 6–23)
CO2: 19 mEq/L (ref 19–32)
Calcium: 8.9 mg/dL (ref 8.4–10.5)
Chloride: 109 mEq/L (ref 96–112)
Creatinine, Ser: 0.7 mg/dL (ref 0.50–1.10)
GFR calc Af Amer: >60 mL/min (ref >60)
GFR calc non Af Amer: >60 mL/min (ref >60)
Glucose, Bld: 106 mg/dL — ABNORMAL HIGH (ref 70–99)
Potassium: 3.9 mEq/L (ref 3.5–5.1)
Sodium: 140 mEq/L (ref 135–145)
Total Bilirubin: 0.2 mg/dL — ABNORMAL LOW (ref 0.3–1.2)
Total Protein: 7.1 g/dL (ref 6.0–8.3)

## 2010-07-15 LAB — DIFFERENTIAL
Basophils Absolute: 0 10*3/uL (ref 0.0–0.1)
Basophils Relative: 0 % (ref 0–1)
Eosinophils Absolute: 0 10*3/uL (ref 0.0–0.7)
Eosinophils Relative: 0 % (ref 0–5)
Lymphocytes Relative: 23 % (ref 12–46)
Lymphs Abs: 1.6 10*3/uL (ref 0.7–4.0)
Monocytes Absolute: 0.3 10*3/uL (ref 0.1–1.0)
Monocytes Relative: 5 % (ref 3–12)
Neutro Abs: 4.9 10*3/uL (ref 1.7–7.7)
Neutrophils Relative %: 72 % (ref 43–77)

## 2010-07-15 LAB — URINALYSIS, ROUTINE W REFLEX MICROSCOPIC
Bilirubin Urine: NEGATIVE
Glucose, UA: NEGATIVE mg/dL
Hgb urine dipstick: NEGATIVE
Ketones, ur: NEGATIVE mg/dL
Leukocytes, UA: NEGATIVE
Nitrite: NEGATIVE
Protein, ur: NEGATIVE mg/dL
Specific Gravity, Urine: 1.017 (ref 1.005–1.030)
Urobilinogen, UA: 0.2 mg/dL (ref 0.0–1.0)
pH: 7 (ref 5.0–8.0)

## 2010-07-15 LAB — CBC
HCT: 36.1 % (ref 36.0–46.0)
Hemoglobin: 12.7 g/dL (ref 12.0–15.0)
MCH: 31.3 pg (ref 26.0–34.0)
MCHC: 35.2 g/dL (ref 30.0–36.0)
MCV: 88.9 fL (ref 78.0–100.0)
Platelets: 276 10*3/uL (ref 150–400)
RBC: 4.06 MIL/uL (ref 3.87–5.11)
RDW: 12.7 % (ref 11.5–15.5)
WBC: 6.9 10*3/uL (ref 4.0–10.5)

## 2010-08-22 ENCOUNTER — Emergency Department (HOSPITAL_COMMUNITY): Payer: Medicare Other

## 2010-08-22 ENCOUNTER — Observation Stay (HOSPITAL_COMMUNITY)
Admission: EM | Admit: 2010-08-22 | Discharge: 2010-08-24 | Disposition: A | Discharge status: Home / Self Care | Payer: Medicare Other | Source: Ambulatory Visit | Attending: Internal Medicine | Admitting: Internal Medicine

## 2010-08-22 DIAGNOSIS — R51 Headache: Secondary | ICD-10-CM | POA: Insufficient documentation

## 2010-08-22 DIAGNOSIS — M1990 Unspecified osteoarthritis, unspecified site: Secondary | ICD-10-CM | POA: Insufficient documentation

## 2010-08-22 DIAGNOSIS — R569 Unspecified convulsions: Principal | ICD-10-CM | POA: Insufficient documentation

## 2010-08-22 DIAGNOSIS — Z79899 Other long term (current) drug therapy: Secondary | ICD-10-CM | POA: Insufficient documentation

## 2010-08-22 DIAGNOSIS — D649 Anemia, unspecified: Secondary | ICD-10-CM | POA: Insufficient documentation

## 2010-08-22 DIAGNOSIS — R55 Syncope and collapse: Secondary | ICD-10-CM | POA: Insufficient documentation

## 2010-08-22 DIAGNOSIS — E876 Hypokalemia: Secondary | ICD-10-CM | POA: Insufficient documentation

## 2010-08-22 DIAGNOSIS — F329 Major depressive disorder, single episode, unspecified: Secondary | ICD-10-CM | POA: Insufficient documentation

## 2010-08-22 DIAGNOSIS — F3289 Other specified depressive episodes: Secondary | ICD-10-CM | POA: Insufficient documentation

## 2010-08-22 LAB — CARDIAC PANEL(CRET KIN+CKTOT+MB+TROPI)
CK, MB: 2.3 ng/mL (ref 0.3–4.0)
Relative Index: INVALID (ref 0.0–2.5)
Total CK: 85 U/L (ref 7–177)
Troponin I: <0.3 ng/mL (ref <0.30)

## 2010-08-22 LAB — CBC
HCT: 34.4 % — ABNORMAL LOW (ref 36.0–46.0)
Hemoglobin: 11.9 g/dL — ABNORMAL LOW (ref 12.0–15.0)
MCH: 31.1 pg (ref 26.0–34.0)
MCHC: 34.6 g/dL (ref 30.0–36.0)
MCV: 89.8 fL (ref 78.0–100.0)
Platelets: 299 10*3/uL (ref 150–400)
RBC: 3.83 MIL/uL — ABNORMAL LOW (ref 3.87–5.11)
RDW: 12.8 % (ref 11.5–15.5)
WBC: 5.7 10*3/uL (ref 4.0–10.5)

## 2010-08-22 LAB — TROPONIN I: Troponin I: <0.3 ng/mL (ref <0.30)

## 2010-08-22 LAB — DIFFERENTIAL
Basophils Absolute: 0 10*3/uL (ref 0.0–0.1)
Basophils Relative: 0 % (ref 0–1)
Eosinophils Absolute: 0.1 10*3/uL (ref 0.0–0.7)
Eosinophils Relative: 1 % (ref 0–5)
Lymphocytes Relative: 28 % (ref 12–46)
Lymphs Abs: 1.6 10*3/uL (ref 0.7–4.0)
Monocytes Absolute: 0.6 10*3/uL (ref 0.1–1.0)
Monocytes Relative: 11 % (ref 3–12)
Neutro Abs: 3.4 10*3/uL (ref 1.7–7.7)
Neutrophils Relative %: 60 % (ref 43–77)

## 2010-08-22 LAB — BASIC METABOLIC PANEL
BUN: 15 mg/dL (ref 6–23)
CO2: 22 mEq/L (ref 19–32)
Calcium: 9 mg/dL (ref 8.4–10.5)
Chloride: 109 mEq/L (ref 96–112)
Creatinine, Ser: 0.74 mg/dL (ref 0.50–1.10)
GFR calc Af Amer: >60 mL/min (ref >60)
GFR calc non Af Amer: >60 mL/min (ref >60)
Glucose, Bld: 85 mg/dL (ref 70–99)
Potassium: 3.7 mEq/L (ref 3.5–5.1)
Sodium: 140 mEq/L (ref 135–145)

## 2010-08-22 LAB — GLUCOSE, CAPILLARY: Glucose-Capillary: 86 mg/dL (ref 70–99)

## 2010-08-22 LAB — URINALYSIS, ROUTINE W REFLEX MICROSCOPIC
Bilirubin Urine: NEGATIVE
Glucose, UA: NEGATIVE mg/dL
Hgb urine dipstick: NEGATIVE
Ketones, ur: NEGATIVE mg/dL
Leukocytes, UA: NEGATIVE
Nitrite: NEGATIVE
Protein, ur: NEGATIVE mg/dL
Specific Gravity, Urine: 1.015 (ref 1.005–1.030)
Urobilinogen, UA: 0.2 mg/dL (ref 0.0–1.0)
pH: 7 (ref 5.0–8.0)

## 2010-08-22 LAB — CK TOTAL AND CKMB (NOT AT ARMC)
CK, MB: 2.2 ng/mL (ref 0.3–4.0)
Relative Index: INVALID (ref 0.0–2.5)
Total CK: 75 U/L (ref 7–177)

## 2010-08-22 LAB — MAGNESIUM: Magnesium: 2.1 mg/dL (ref 1.5–2.5)

## 2010-08-22 LAB — PREGNANCY, URINE: Preg Test, Ur: NEGATIVE

## 2010-08-22 LAB — PHOSPHORUS: Phosphorus: 2.6 mg/dL (ref 2.3–4.6)

## 2010-08-22 LAB — MRSA PCR SCREENING: MRSA by PCR: NEGATIVE

## 2010-08-23 ENCOUNTER — Observation Stay (HOSPITAL_COMMUNITY): Payer: Medicare Other

## 2010-08-23 DIAGNOSIS — R55 Syncope and collapse: Secondary | ICD-10-CM

## 2010-08-23 LAB — CBC
HCT: 34.4 % — ABNORMAL LOW (ref 36.0–46.0)
Hemoglobin: 11.7 g/dL — ABNORMAL LOW (ref 12.0–15.0)
MCH: 30.8 pg (ref 26.0–34.0)
MCHC: 34 g/dL (ref 30.0–36.0)
MCV: 90.5 fL (ref 78.0–100.0)
Platelets: 285 10*3/uL (ref 150–400)
RBC: 3.8 MIL/uL — ABNORMAL LOW (ref 3.87–5.11)
RDW: 12.9 % (ref 11.5–15.5)
WBC: 4.6 10*3/uL (ref 4.0–10.5)

## 2010-08-23 LAB — RAPID URINE DRUG SCREEN, HOSP PERFORMED
Amphetamines: NOT DETECTED
Barbiturates: NOT DETECTED
Benzodiazepines: POSITIVE — AB
Cocaine: NOT DETECTED
Opiates: POSITIVE — AB
Tetrahydrocannabinol: NOT DETECTED

## 2010-08-23 LAB — CARDIAC PANEL(CRET KIN+CKTOT+MB+TROPI)
CK, MB: 2.3 ng/mL (ref 0.3–4.0)
Relative Index: INVALID (ref 0.0–2.5)
Total CK: 84 U/L (ref 7–177)
Troponin I: <0.3 ng/mL (ref <0.30)

## 2010-08-23 NOTE — Procedures (Unsigned)
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  She also has some syncopal episodes for what she has been admitted to the hospital.  EEG is for seizure evaluation.  MEDICATIONS:  She is on Lamictal and Zonegran.  EEG DURATION:  This is 21.5 minutes of EEG recording.  EEG DESCRIPTION:  This is a routine 16-channel adult EEG reporting with one channel devoted to limited EKG recording.  Activation procedure is performed during the photic stimulation and the studies performed in awake and mild drowsy state.  As the EEG opens up, I noticed that the posterior dominant rhythm consists of 8-9 Hz frequency with a low amplitude of maximum 18 microvolts at the most.  Significant motion artifact and superimposed beta activities were recorded throughout the study.  At least two times during the study, the patient had some jerking episode of her leg and one of them does not seem to correlate electrographically to any seizure activity, however, in another event of brief 7-8 second duration, she had shaking of her upper body with generalized rhythmicity that could possibly be epileptogenic.  Another important finding is the presence of 1-2 second burst of spike and slow wave complexes that seem to be randomly scattered by hemispherically mostly on the frontocentral locations and the right side seemed to have more magnitude as compared to the left frontocentral location.  I do see some vertex waves and synchronous spindles as the patient got drowsy, however, no deep stages of sleep is identified.  EEG INTERPRETATION:  This is an abnormal study due to the presence of spike and slow wave complexes which are most prominent in the right frontocentral location and could represent area of cortical irritability as the focus of epilepsy.  Brief rhythmicity of 7-8 seconds was also noted to correlate with  the patient's upper body shaking, however, it did not merit to be electrographic seizure given the small duration.  CLINICAL NOTE:  Given the history of seizures as well as above findings on the EEG, the use of antiepileptic medication is highly recommended in this patient.  I have advised the primary Hospitalist Team to add on Vimpat to the patient's antiepileptic medications.  Further localization and characterization of her seizures can be performed with the help of formal epilepsy monitoring unit evaluation.          ______________________________ Levie Heritage, MD    ZO:XWRU D:  08/23/2010 11:14:04  T:  08/23/2010 15:05:59  Job #:  [id number]

## 2010-08-23 NOTE — H&P (Signed)
[name], [name] NO.:  [id number]  MEDICAL RECORD NO.:  [id number]  LOCATION:  [facility]                         FACILITY:  [facility]  PHYSICIAN:  Andreas Blower, MD       DATE OF BIRTH:  [birth year]  DATE OF ADMISSION:  08/22/2010 DATE OF DISCHARGE:                             HISTORY & PHYSICAL   PRIMARY CARE PHYSICIAN:  Ernestina Penna, MD  NEUROLOGIST:  Evie Lacks, MD  CHIEF COMPLAINT:  Seizure and loss of consciousness 2 days ago.  HISTORY OF PRESENT ILLNESS:  [name] is a 44 year old Caucasian female with history of seizure disorder, depression, headache, and suicide attempt in the past. She comes in complaining of seizure.  She reports that she was in her dentist's office and was having a procedure done.  As the dentist was getting ready to drill her teeth, the patient reported that she had an episode of seizure.  The patient is not able to provide history of what happened, her husband who was with the patientreported that this happened at 10:30 a.m. and this lasted for about 30 minutes.  He reported that she had multiple episodes where she would have seizures and was arousable, then would start having seizures again. Husband reports that she perhaps may have had these episodes of 5 or 6 in 30 minutes.  When the EMS arrived, the patient was given Ativan and subsequently after that, the seizures had resolved.  While the patient was in the ER, he was reported that the patient was unresponsive for a period of time and has had two such episodes.  The patient reported that yesterday morning between 7 or 9 a.m., she slipped on the carpet and fell and hit her head, she denies losing consciousness at that time. About 2 days ago, around 12:30 a.m., her husband reported that the patient was attempting to walk to her mother's house which is close by and the patient had lost consciousness and was in the yard.  The husband witnessed the patient lose consciousness,  as a result rushed to the patient.  The patient again cannot recall the event clearly, but remembers her husband calling her name after she became conscious.  The patient denies any recent fevers, but does feel warm all the time.  Denies any chest pain, has intermittent episodes of cough and her primary care physician per patient thought it may be due to bronchitis. Currently denies any cough and denies any acute vision changes.  Does report that she is in the process of seeing an ophthalmologist for chronic eye changes.  Does complain of headache and the patient chronically has headaches.  Denies any nausea or vomiting.  REVIEW OF SYSTEMS:  All systems were reviewed with the patient, was positive as per HPI, otherwise all other systems are negative.  PAST MEDICAL HISTORY: 1. History of seizure disorder on medications. 2. Depression. 3. Headache. 4. History of suicide attempts. 5. Questionable history of bronchitis, reports that she is on     breathing treatments at home.  SOCIAL HISTORY:  The patient reports that she has never smoked, does not drink any alcohol.  Lives at home with husband, is disabled  due to seizures.  FAMILY HISTORY:  Significant for mother having ovarian cancer.  Father had kidney problems.  Has two brothers, one is a diabetic, other is near diabetic.  HOME MEDICATIONS: 1. Clonazepam 1.5 mg daily at bedtime. 2. Ciprodex otic solution to right ear 2 drops twice daily as needed. 3. Aleve 220 mg p.o. daily. 4. Zonegran 200 mg p.o. daily at bedtime. 5. Lamictal 200 mg p.o. twice daily.  PHYSICAL EXAMINATION:  VITAL SIGNS:  Temperature 98.4, blood pressure 110/75, heart rate 86, respirations 16, and satting at 99% on room air. ORTHOSTATICS VITALS:  Laying blood pressure was 113/74 with a pulse of 79, standing was 112/88 with pulse of 111, sitting was 110/78 with pulse of 89. GENERAL:  The patient was alert and oriented, did not appear to be in acute  distress, was lying in bed comfortably. HEENT:  Extraocular motions are intact.  Pupils are equal and round. Had moist mucous membranes. NECK:  Supple. HEART:  Regular with S1, S2. LUNGS:  Clear to auscultation bilaterally. ABDOMEN:  Soft, nontender, nondistended.  Positive bowel sounds. EXTREMITIES:  The patient had good peripheral pulses with trace edema. NEUROLOGIC:  Cranial nerves II through XII grossly intact.  Had 5/5 motor strength in upper as well as lower extremities.  RADIOLOGY/IMAGING:  The patient had an EKG which showed normal sinus rhythm.  The patient had a head CT without contrast, which was negative for bleed or other acute intracranial process.  LABORATORY DATA:  CBC shows a white count of 5.7, hemoglobin 11.9, hematocrit 34.4, and platelet count 299.  Electrolytes are normal with a BUN of 15 and creatinine 0.74.  Initial troponin was less than 0.30.  UA is negative for nitrites and leukocytes.  ASSESSMENT/PLAN: 1. Seizure with history of seizure disorder.  Spoke with Dr. Hoy Morn     with Neurology.  Neurology has been consulted, appreciate their     recommendations.  We will get an MRI of the brain without contrast. We will continue her current home medications. Further titration     of her seizure medications as per Neurology. 2. Questionable syncope.  The patient had orthostatic vitals done,     however the patient has received fluid in the ER.  We will admit     the patient to telemetry to trend her troponins.  EKG shows normal     sinus rhythm at that time.  We will have Physical Therapy evaluate     the patient in morning. 3. Depression, stable.  Not active at this time. 4. Prophylaxis, Lovenox for DVT prophylaxis. 5. Code status.  The patient is full code.  This was discussed with     the patient, husband, and son at the time of admission.  Time spent on admission talking to the patient and coordinating care was 1 hour.   Andreas Blower, MD   SR/MEDQ   D:  08/22/2010  T:  08/22/2010  Job:  [id number]  Electronically Signed by Wardell Heath Shizue Kaseman  on 08/23/2010 08:21:40 PM

## 2010-08-24 LAB — BASIC METABOLIC PANEL
BUN: 12 mg/dL (ref 6–23)
CO2: 21 mEq/L (ref 19–32)
Calcium: 9 mg/dL (ref 8.4–10.5)
Chloride: 110 mEq/L (ref 96–112)
Creatinine, Ser: 0.7 mg/dL (ref 0.50–1.10)
GFR calc Af Amer: >60 mL/min (ref >60)
GFR calc non Af Amer: >60 mL/min (ref >60)
Glucose, Bld: 97 mg/dL (ref 70–99)
Potassium: 3.4 mEq/L — ABNORMAL LOW (ref 3.5–5.1)
Sodium: 140 mEq/L (ref 135–145)

## 2010-08-24 LAB — FERRITIN: Ferritin: 80 ng/mL (ref 10–291)

## 2010-08-24 LAB — IRON AND TIBC
Iron: 44 ug/dL (ref 42–135)
Saturation Ratios: 14 % — ABNORMAL LOW (ref 20–55)
TIBC: 320 ug/dL (ref 250–470)
UIBC: 276 ug/dL

## 2010-08-24 LAB — VITAMIN B12: Vitamin B-12: 533 pg/mL (ref 211–911)

## 2010-08-24 LAB — FOLATE: Folate: 17 ng/mL

## 2010-08-24 LAB — TSH: TSH: 1.072 u[IU]/mL (ref 0.350–4.500)

## 2010-08-24 NOTE — Discharge Summary (Signed)
NAMECASSADY, STANCZAK               ACCOUNT NO.:  [id number]  MEDICAL RECORD NO.:  [id number]  LOCATION:  3039                         FACILITY:  [facility]  PHYSICIAN:  Andreas Blower, MD       DATE OF BIRTH:  03-20-1965  DATE OF ADMISSION:  08/22/2010 DATE OF DISCHARGE:                              DISCHARGE SUMMARY   PRIMARY CARE PHYSICIAN:  Dr. Rudi Heap.  PRIMARY NEUROLOGIST:  Genene Churn. Love, MD.  DISCHARGE DIAGNOSES: 1. Seizure. 2. Syncope likely related to seizure, resolved. 3. Depression. 4. Mild anemia, anemia panel pending. 5. History of headaches. 6. History of suicide attempts. 7. Hypokalemia.  DISCHARGE MEDICATIONS: 1. Lacosamide 50 mg p.o. twice daily for 1 day, then increase the dose     to 100 mg p.o. b.i.d. thereafter. 2. Naproxen 220 mg p.o. daily. 3. Ciprodex Otic 2 drops in right ear twice daily as needed. 4. Clonazepam 1.5 mg p.o. daily at bedtime. 5. Lamotrigine 200 mg p.o. twice daily. 6. Zonegran 200 mg p.o. daily at bedtime.  BRIEF ADMITTING HISTORY AND PHYSICAL:  [name] is a 45 year old Caucasian female with history of seizure disorder, depression, headaches who presents with complaints of seizure on August 22, 2010.  RADIOLOGY/IMAGING:  The patient had a CT of the head without contrast which was negative for bleed or other acute intracranial process. The patient had an MRI of the brain without contrast which showed no acute or focal abnormalities to explain seizure or syncope. The patient had an EEG on August 23, 2010, which showed abnormal study due to presence of spike and slow wave complexes which are most prominent in the right frontocentral location and could represent area of cortical irritability as the focus of epilepsy.  LABORATORY DATA:  CBC shows a white count of 4.6, hemoglobin 11.7, hematocrit 34.4, platelet count 285.  Electrolytes normal except sodium is 134.  Troponin negative x3.  UA is negative for nitrites and leukocytes.   Urine drug screen was positive for benzodiazepines and opiates.  HOSPITAL COURSE BY PROBLEMS: 1. Seizure.  The patient was admitted and on the day of admission the     patient had a seizure as the result the patient was transferred to     Neuro ICU for closer monitoring.  Neurology was consulted and EEG     was obtained which showed findings consistent with focus of     epilepsy.  MRI of the brain was negative.  Neurology started the     patient on Lacosamide to her seizure regimen.  The patient to     follow with [name] her neurologist as outpatient. 2. Syncope.  The patient was monitored on tele, had troponins negative     x3.  Also, had a 2-D echocardiogram which showed wall thickness was     normal, systolic function was normal with an ejection fraction of     60-65%.  Suspect that her syncope is likely seizure related. 3. Anemia, anemia panel is pending.  The patient was normocytic. 4. Hypokalemia, I will replace as needed. 5. Depression, stable. 6. Chronic osteoarthritis.  The patient was evaluated by Physial     Therapy, did not find  any physical therapy needs.  Disposition: The patient to be discharged home with follow up with [name] her neurologist in 1 week.  Time spent on discharge talking to the patient family and coordinating care was 25 minutes.   Andreas Blower, MD   SR/MEDQ  D:  08/24/2010  T:  08/24/2010  Job:  [id number]  Electronically Signed by Wardell Heath Brownie Gockel  on 08/24/2010 04:54:09 PM

## 2010-08-28 ENCOUNTER — Emergency Department (HOSPITAL_COMMUNITY)
Admission: EM | Admit: 2010-08-28 | Discharge: 2010-08-28 | Disposition: A | Discharge status: Home / Self Care | Payer: Medicare Other | Attending: Emergency Medicine | Admitting: Emergency Medicine

## 2010-08-28 DIAGNOSIS — G40909 Epilepsy, unspecified, not intractable, without status epilepticus: Secondary | ICD-10-CM | POA: Insufficient documentation

## 2010-08-28 DIAGNOSIS — Z79899 Other long term (current) drug therapy: Secondary | ICD-10-CM | POA: Insufficient documentation

## 2010-08-28 LAB — CBC
HCT: 35.8 % — ABNORMAL LOW (ref 36.0–46.0)
Hemoglobin: 12 g/dL (ref 12.0–15.0)
MCH: 30.8 pg (ref 26.0–34.0)
MCHC: 33.5 g/dL (ref 30.0–36.0)
MCV: 91.8 fL (ref 78.0–100.0)
Platelets: 302 10*3/uL (ref 150–400)
RBC: 3.9 MIL/uL (ref 3.87–5.11)
RDW: 12.8 % (ref 11.5–15.5)
WBC: 5.5 10*3/uL (ref 4.0–10.5)

## 2010-08-28 LAB — URINALYSIS, ROUTINE W REFLEX MICROSCOPIC
Bilirubin Urine: NEGATIVE
Glucose, UA: NEGATIVE mg/dL
Hgb urine dipstick: NEGATIVE
Ketones, ur: NEGATIVE mg/dL
Leukocytes, UA: NEGATIVE
Nitrite: NEGATIVE
Protein, ur: NEGATIVE mg/dL
Specific Gravity, Urine: 1.02 (ref 1.005–1.030)
Urobilinogen, UA: 0.2 mg/dL (ref 0.0–1.0)
pH: 5.5 (ref 5.0–8.0)

## 2010-08-28 LAB — COMPREHENSIVE METABOLIC PANEL
ALT: 29 U/L (ref 0–35)
AST: 26 U/L (ref 0–37)
Albumin: 4 g/dL (ref 3.5–5.2)
Alkaline Phosphatase: 77 U/L (ref 39–117)
BUN: 14 mg/dL (ref 6–23)
CO2: 24 mEq/L (ref 19–32)
Calcium: 9.7 mg/dL (ref 8.4–10.5)
Chloride: 107 mEq/L (ref 96–112)
Creatinine, Ser: 0.83 mg/dL (ref 0.50–1.10)
GFR calc Af Amer: >60 mL/min (ref >60)
GFR calc non Af Amer: >60 mL/min (ref >60)
Glucose, Bld: 99 mg/dL (ref 70–99)
Potassium: 3.7 mEq/L (ref 3.5–5.1)
Sodium: 141 mEq/L (ref 135–145)
Total Bilirubin: 0.4 mg/dL (ref 0.3–1.2)
Total Protein: 6.8 g/dL (ref 6.0–8.3)

## 2010-08-28 LAB — DIFFERENTIAL
Basophils Absolute: 0 10*3/uL (ref 0.0–0.1)
Basophils Relative: 0 % (ref 0–1)
Eosinophils Absolute: 0.1 10*3/uL (ref 0.0–0.7)
Eosinophils Relative: 3 % (ref 0–5)
Lymphocytes Relative: 33 % (ref 12–46)
Lymphs Abs: 1.8 10*3/uL (ref 0.7–4.0)
Monocytes Absolute: 0.5 10*3/uL (ref 0.1–1.0)
Monocytes Relative: 9 % (ref 3–12)
Neutro Abs: 3.1 10*3/uL (ref 1.7–7.7)
Neutrophils Relative %: 56 % (ref 43–77)

## 2010-08-28 LAB — RAPID URINE DRUG SCREEN, HOSP PERFORMED
Amphetamines: NOT DETECTED
Barbiturates: NOT DETECTED
Benzodiazepines: POSITIVE — AB
Cocaine: NOT DETECTED
Opiates: POSITIVE — AB
Tetrahydrocannabinol: NOT DETECTED

## 2010-09-03 ENCOUNTER — Emergency Department (HOSPITAL_COMMUNITY)
Admission: EM | Admit: 2010-09-03 | Discharge: 2010-09-04 | Disposition: A | Discharge status: Other Acute Inpatient Hospital | Payer: 59 | Source: Home / Self Care | Attending: Emergency Medicine | Admitting: Emergency Medicine

## 2010-09-03 DIAGNOSIS — F1120 Opioid dependence, uncomplicated: Secondary | ICD-10-CM | POA: Insufficient documentation

## 2010-09-03 DIAGNOSIS — G40909 Epilepsy, unspecified, not intractable, without status epilepticus: Secondary | ICD-10-CM | POA: Insufficient documentation

## 2010-09-03 DIAGNOSIS — F341 Dysthymic disorder: Secondary | ICD-10-CM | POA: Insufficient documentation

## 2010-09-03 DIAGNOSIS — R45851 Suicidal ideations: Secondary | ICD-10-CM | POA: Insufficient documentation

## 2010-09-03 DIAGNOSIS — E876 Hypokalemia: Secondary | ICD-10-CM | POA: Insufficient documentation

## 2010-09-03 DIAGNOSIS — Z79899 Other long term (current) drug therapy: Secondary | ICD-10-CM | POA: Insufficient documentation

## 2010-09-03 LAB — DIFFERENTIAL
Basophils Absolute: 0 10*3/uL (ref 0.0–0.1)
Basophils Relative: 0 % (ref 0–1)
Eosinophils Absolute: 0.2 10*3/uL (ref 0.0–0.7)
Eosinophils Relative: 2 % (ref 0–5)
Lymphocytes Relative: 32 % (ref 12–46)
Lymphs Abs: 2.6 10*3/uL (ref 0.7–4.0)
Monocytes Absolute: 0.5 10*3/uL (ref 0.1–1.0)
Monocytes Relative: 6 % (ref 3–12)
Neutro Abs: 5 10*3/uL (ref 1.7–7.7)
Neutrophils Relative %: 60 % (ref 43–77)

## 2010-09-03 LAB — RAPID URINE DRUG SCREEN, HOSP PERFORMED
Amphetamines: NOT DETECTED
Barbiturates: NOT DETECTED
Benzodiazepines: POSITIVE — AB
Cocaine: NOT DETECTED
Opiates: NOT DETECTED
Tetrahydrocannabinol: NOT DETECTED

## 2010-09-03 LAB — COMPREHENSIVE METABOLIC PANEL
ALT: 30 U/L (ref 0–35)
AST: 33 U/L (ref 0–37)
Albumin: 4.1 g/dL (ref 3.5–5.2)
Alkaline Phosphatase: 77 U/L (ref 39–117)
BUN: 11 mg/dL (ref 6–23)
CO2: 23 mEq/L (ref 19–32)
Calcium: 9.9 mg/dL (ref 8.4–10.5)
Chloride: 104 mEq/L (ref 96–112)
Creatinine, Ser: 0.78 mg/dL (ref 0.50–1.10)
GFR calc Af Amer: >60 mL/min (ref >60)
GFR calc non Af Amer: >60 mL/min (ref >60)
Glucose, Bld: 124 mg/dL — ABNORMAL HIGH (ref 70–99)
Potassium: 3.3 mEq/L — ABNORMAL LOW (ref 3.5–5.1)
Sodium: 141 mEq/L (ref 135–145)
Total Bilirubin: 0.3 mg/dL (ref 0.3–1.2)
Total Protein: 7.2 g/dL (ref 6.0–8.3)

## 2010-09-03 LAB — CBC
HCT: 37.3 % (ref 36.0–46.0)
Hemoglobin: 13.1 g/dL (ref 12.0–15.0)
MCH: 31.7 pg (ref 26.0–34.0)
MCHC: 35.1 g/dL (ref 30.0–36.0)
MCV: 90.3 fL (ref 78.0–100.0)
Platelets: 276 10*3/uL (ref 150–400)
RBC: 4.13 MIL/uL (ref 3.87–5.11)
RDW: 12.7 % (ref 11.5–15.5)
WBC: 8.2 10*3/uL (ref 4.0–10.5)

## 2010-09-03 LAB — ETHANOL: Alcohol, Ethyl (B): <11 mg/dL (ref 0–11)

## 2010-09-03 LAB — ACETAMINOPHEN LEVEL: Acetaminophen (Tylenol), Serum: <15 ug/mL (ref 10–30)

## 2010-09-03 LAB — SALICYLATE LEVEL: Salicylate Lvl: <2 mg/dL — ABNORMAL LOW (ref 2.8–20.0)

## 2010-09-03 LAB — PREGNANCY, URINE: Preg Test, Ur: NEGATIVE

## 2010-09-04 ENCOUNTER — Ambulatory Visit (HOSPITAL_COMMUNITY)
Admission: EM | Admit: 2010-09-04 | Discharge: 2010-09-04 | Disposition: A | Discharge status: Home / Self Care | Payer: Medicare Other | Source: Ambulatory Visit | Attending: Emergency Medicine | Admitting: Emergency Medicine

## 2010-09-04 ENCOUNTER — Inpatient Hospital Stay (HOSPITAL_COMMUNITY)
Admission: AD | Admit: 2010-09-04 | Discharge: 2010-09-06 | DRG: 885 | Disposition: A | Discharge status: Home / Self Care | Payer: 59 | Source: Ambulatory Visit | Attending: Psychiatry | Admitting: Psychiatry

## 2010-09-04 DIAGNOSIS — F411 Generalized anxiety disorder: Secondary | ICD-10-CM

## 2010-09-04 DIAGNOSIS — Z79899 Other long term (current) drug therapy: Secondary | ICD-10-CM

## 2010-09-04 DIAGNOSIS — Z91040 Latex allergy status: Secondary | ICD-10-CM

## 2010-09-04 DIAGNOSIS — R569 Unspecified convulsions: Secondary | ICD-10-CM

## 2010-09-04 DIAGNOSIS — R45851 Suicidal ideations: Secondary | ICD-10-CM | POA: Insufficient documentation

## 2010-09-04 DIAGNOSIS — G40909 Epilepsy, unspecified, not intractable, without status epilepticus: Secondary | ICD-10-CM | POA: Insufficient documentation

## 2010-09-04 DIAGNOSIS — F339 Major depressive disorder, recurrent, unspecified: Secondary | ICD-10-CM

## 2010-09-04 DIAGNOSIS — F430 Acute stress reaction: Secondary | ICD-10-CM | POA: Insufficient documentation

## 2010-09-04 DIAGNOSIS — F329 Major depressive disorder, single episode, unspecified: Secondary | ICD-10-CM | POA: Insufficient documentation

## 2010-09-04 DIAGNOSIS — Z96649 Presence of unspecified artificial hip joint: Secondary | ICD-10-CM

## 2010-09-04 DIAGNOSIS — F3289 Other specified depressive episodes: Secondary | ICD-10-CM | POA: Insufficient documentation

## 2010-09-04 DIAGNOSIS — Z880 Allergy status to penicillin: Secondary | ICD-10-CM

## 2010-09-04 DIAGNOSIS — Z560 Unemployment, unspecified: Secondary | ICD-10-CM

## 2010-09-05 NOTE — Assessment & Plan Note (Signed)
[name], [name]               ACCOUNT NO.:  [id number]  MEDICAL RECORD NO.:  [id number]  LOCATION:  1610                          [facility]ACILITY[facility]  [facility]  PHYSICIAN:  Franchot Gallo, MD     DATE OF BIRTH:  Jul 26, 1965  DATE OF ADMISSION:  09/04/2010 DATE OF DISCHARGE:                      PSYCHIATRIC ADMISSION ASSESSMENT   IDENTIFYING INFORMATION:  This is a 45 year old married female.  This is a voluntary admission.  HISTORY OF PRESENT ILLNESS:  Second [facility] admission for [name] who reports that she took several tablets of her new seizure medication on August 21 while feeling a lot of mounting anxiety waiting to reveal to her husband that she had spent essentially all of their savings.  What she actually took and the amount is rather unclear and she apparently did not reveal this to the emergency room staff.  She reports that at about 4 years ago she began dipping into their $30,000 dollars savings account in order to help meet regular expenses.  Over the course of the 4 years she essentially spent all of the savings account but never revealed this to her husband since she was the one managing his finances.  During this time her husband was diagnosed with leukemia and she did not want to burden him with the news.  After depleting the count for the past few months now she has become increasingly anxious, wanting to reveal this to him and yet dreading having to do so.  Yesterday she took several pills of unknown type, wrote him a note revealing her financial transgressions, and then got in the car and went to her mother's.  He followed her there and on learning about the issue was not upset with her, but she revealed that she was suicidal and he brought her to the emergency room.  No homicidal thoughts.  No evidence of hallucinations.  PAST PSYCHIATRIC HISTORY:  Currently followed by Dr. Almeta Monas at [facility].  He is her psychologist.  She reports taking Paxil 20 mg daily for  depression.  Previously at [facility] in January 2006 after impulsive overdose after her 4 year old son disclosed that he was the father of a baby by his 51 year old girlfriend.  She overdosed on her own medications.  She denies any history of substance abuse.  SOCIAL HISTORY:  Married female on disability due to seizure disorder. Denies legal problems.  Moderate financial stressors.  Reports husband is supportive.  FAMILY HISTORY:  Negative for mental illness or substance abuse.  MEDICAL HISTORY:  Primary care physician is Dr. Rudi Heap at [facility] and she is followed by Dr. Avie Echevaria for seizure disorder.  Current medical problems are epilepsy.  PAST MEDICAL HISTORY:  Congenital dislocated hip left, with history of three total hip replacements.  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  CURRENT MEDICATIONS: 1. Lamictal 200 mg q. 8:00 a.m. and 8:00 p.m. 2. Klonopin 0.5 mg 3 tablets at bedtime. 3. Paxil 20 mg daily. 4. Lacosamide 100 mg b.i.d.  DRUG ALLERGIES:  Latex, codeine, Ultram and penicillin.  PHYSICAL EXAMINATION:  Medical evaluation was done in the [facility] emergency room.  This is a normally-developed healthy-appearing female, cooperative today in  no acute medical distress. VITAL SIGNS:  Temperature 97.3, pulse 86, respirations 18, blood pressure 112/82.  She weighs 76 kg and is 5 feet 1 inch tall.  PHYSICAL EXAMINATION:  CBC noted to be normal at a hemoglobin of 13.1. Chemistry reveals a mildly decreased potassium at 3.3, BUN 11, creatinine 0.78, calcium normal at 9.9 and random glucose 124.  Urine pregnancy test was negative.  Acetaminophen and salicylate screens were negative and urine drug screen positive for benzodiazepines.  Alcohol screen less than 11.  MENTAL STATUS EXAM:  Fully alert female, pleasant and cooperative. Gives a coherent history.  Feels she may have had a petit mal seizure this morning which she says  is typical for her.  Has not had all of her seizure medications in the past 24 hours.  Mood is depressed.  Speech normal.  Thinking nonpsychotic, logical.  [place] Spaniel she has been very anxious and feels rather depressed about using up their savings though husband has been supportive.  Cognitively she is intact, oriented x3.  ASSESSMENT:  AXIS I:  Depressive disorder not otherwise specified. AXIS II:  Deferred. AXIS III:.  Epilepsy. AXIS IV:  Chronic financial stressors, supportive family and stable home as an asset. AXIS V:  Current 42, past year not known.  PLAN:  Voluntarily admit her with a goal of stabilizing her and alleviating any suicidal thoughts.  We are going to continue her current medications, restart her antiseizure medications and have reviewed the recent consult dictated by Dr. Melvyn Novas and recent EEG interpretation report and her discharge summary from August 11 related to a recent syncopal episode likely related to seizure.     [name], N.P.   ______________________________ Franchot Gallo, MD    MAS/MEDQ  D:  09/04/2010  T:  09/05/2010  Job:  [id number]  Electronically Signed by Kari Baars N.P. on 09/05/2010 08:14:09 AM Electronically Signed by Franchot Gallo MD on 09/05/2010 05:16:30 PM

## 2010-09-06 LAB — POCT I-STAT, CHEM 8
BUN: 14 mg/dL (ref 6–23)
Calcium, Ion: 1.19 mmol/L (ref 1.12–1.32)
Chloride: 103 mEq/L (ref 96–112)
Creatinine, Ser: 0.8 mg/dL (ref 0.50–1.10)
Glucose, Bld: 91 mg/dL (ref 70–99)
HCT: 42 % (ref 36.0–46.0)
Hemoglobin: 14.3 g/dL (ref 12.0–15.0)
Potassium: 3.9 mEq/L (ref 3.5–5.1)
Sodium: 140 mEq/L (ref 135–145)
TCO2: 24 mmol/L (ref 0–100)

## 2010-09-10 NOTE — Discharge Summary (Signed)
[name], [name]               ACCOUNT NO.:  [id number]  MEDICAL RECORD NO.:  [id number]  LOCATION:  4098                          [facility]ACILITY:  [facility]  PHYSICIAN:  Franchot Gallo, MD     DATE OF BIRTH:  10/10/1965  DATE OF ADMISSION:  09/04/2010 DATE OF DISCHARGE:  09/06/2010                              DISCHARGE SUMMARY   REASON FOR ADMISSION:  This is a 45 year old female who was admitted after the patient took several tablets of her seizure medication.  She was having increased anxiety and her anxiety was increasing.  She was waiting to reveal to her husband that she has spent essentially all of their savings.  She denied any homicidal thoughts or having any psychotic symptoms and no history of any substance use.  FINAL IMPRESSION:  Major depressive disorder, recurrent, good control, generalized anxiety disorder, pseudoseizures. Axis II: Deferred. Axis III:  Seizure disorder, congenital hip dislocation status post hip replacement x3. Axis IV:  Serious chronic illness, financial constraints. Axis V: GAF at discharge was 70.  PERTINENT LABS:  CBC showed a hemoglobin of 13.1.  Urine pregnancy test was negative.  Acetaminophen and salicylate screens were negative. Urine drug screen is positive for benzodiazepines.  Alcohol level was less than 11.  SIGNIFICANT FINDINGS:  The patient was admitted to the adult milieu for safety and stabilization.  We resumed her current medications, her antiseizure medications.  The patient was endorsing problems with sleep and a decreased appetite, having mild depressive symptoms rating it at 5 on a scale of 1-10, having no suicidal or homicidal thoughts.  No medication side effects. We continued her Paxil, Lamictal, Klonopin for sleep and had trazodone for her sleep.  She was participating in the discharge planning groups, reporting that she had spent over $30,000 dollars over the past couple years and had to inform her husband recently of  such.  She was improving, having good sleep and was still reporting no problem with her appetite.  Rating her hopelessness of 0 on a scale of 1-10 and adamantly denied any suicidal thoughts.  On day of discharge the patient's sleep was good.  Her appetite was okay stated that was her normal.  Her depression had resolved, rating it on a 1 on a scale of 1-10 and denied any suicidal or homicidal thoughts, auditory or visual hallucinations, having no delusional thinking.  Her anxiety had resolved rating it at zero.  She did report that she was feeling better after she had told her husband of her spending their savings.  The day of discharge the patient's sleep was good.  Her appetite was good feeling she was back to her normal.  Her depression had resolved rating it at 1 on a scale of 1- 10.  Adamantly denied any suicidal or homicidal thoughts or auditory or visual hallucinations, anxiety had resolved rating it at zero, having no medication side effects.  Her discharge medications included:  Klonopin 1.5 mg q.h.s., Lamictal 200 mg b.i.d., Paxil 20 mg daily, Vimpat 100 mg b.i.d., trazodone 100 mg for sleep. Her follow-up appointment was at [facility].  Phone number [phone] on Wednesday, August 29 at 8 a.m.     Liborio Nixon  Theressa Stamps, N.P.   ______________________________ Franchot Gallo, MD    JO/MEDQ  D:  09/09/2010  T:  09/09/2010  Job:  [id number]  Electronically Signed by Limmie PatriciaP. on 09/10/2010 09:06:11 AM Electronically Signed by Franchot Gallo MD on 09/10/2010 04:43:24 PM

## 2010-09-25 ENCOUNTER — Emergency Department (HOSPITAL_COMMUNITY)
Admission: EM | Admit: 2010-09-25 | Discharge: 2010-09-25 | Disposition: A | Discharge status: Home / Self Care | Payer: Medicare Other | Attending: Emergency Medicine | Admitting: Emergency Medicine

## 2010-09-25 DIAGNOSIS — K08409 Partial loss of teeth, unspecified cause, unspecified class: Secondary | ICD-10-CM | POA: Insufficient documentation

## 2010-09-25 DIAGNOSIS — F329 Major depressive disorder, single episode, unspecified: Secondary | ICD-10-CM | POA: Insufficient documentation

## 2010-09-25 DIAGNOSIS — Z79899 Other long term (current) drug therapy: Secondary | ICD-10-CM | POA: Insufficient documentation

## 2010-09-25 DIAGNOSIS — G40909 Epilepsy, unspecified, not intractable, without status epilepticus: Secondary | ICD-10-CM | POA: Insufficient documentation

## 2010-09-25 DIAGNOSIS — K08109 Complete loss of teeth, unspecified cause, unspecified class: Secondary | ICD-10-CM | POA: Insufficient documentation

## 2010-09-25 DIAGNOSIS — F3289 Other specified depressive episodes: Secondary | ICD-10-CM | POA: Insufficient documentation

## 2010-09-25 LAB — POCT I-STAT, CHEM 8
BUN: 12 mg/dL (ref 6–23)
Calcium, Ion: 1.19 mmol/L (ref 1.12–1.32)
Chloride: 107 mEq/L (ref 96–112)
Creatinine, Ser: 0.8 mg/dL (ref 0.50–1.10)
Glucose, Bld: 84 mg/dL (ref 70–99)
HCT: 39 % (ref 36.0–46.0)
Hemoglobin: 13.3 g/dL (ref 12.0–15.0)
Potassium: 3.9 mEq/L (ref 3.5–5.1)
Sodium: 142 mEq/L (ref 135–145)
TCO2: 21 mmol/L (ref 0–100)

## 2010-10-02 LAB — COMPREHENSIVE METABOLIC PANEL
ALT: 24
AST: 19
Albumin: 4
Alkaline Phosphatase: 78
BUN: 7
CO2: 28
Calcium: 9.5
Chloride: 107
Creatinine, Ser: 0.9
GFR calc Af Amer: >60
GFR calc non Af Amer: >60
Glucose, Bld: 107 — ABNORMAL HIGH
Potassium: 4.3
Sodium: 142
Total Bilirubin: 0.8
Total Protein: 6.9

## 2010-10-02 LAB — DIFFERENTIAL
Basophils Absolute: 0
Basophils Relative: 0
Eosinophils Absolute: 0.1
Eosinophils Relative: 1
Lymphocytes Relative: 35
Lymphs Abs: 1.9
Monocytes Absolute: 0.4
Monocytes Relative: 7
Neutro Abs: 3
Neutrophils Relative %: 57

## 2010-10-02 LAB — PREGNANCY, URINE: Preg Test, Ur: NEGATIVE

## 2010-10-02 LAB — CBC
HCT: 36.3
Hemoglobin: 12.8
MCHC: 35.4
MCV: 89.5
Platelets: 335
RBC: 4.06
RDW: 12.3
WBC: 5.4

## 2010-10-02 LAB — HEMATOCRIT: HCT: 32.3 — ABNORMAL LOW

## 2010-10-02 NOTE — Consult Note (Signed)
[name], [name] NO.:  [id number]  MEDICAL RECORD NO.:  [id number]  LOCATION:  3106                         FACILITY:  [facility]  PHYSICIAN:  [name], M.D.  DATE OF BIRTH:  1966/01/10  DATE OF CONSULTATION:  08/22/2010 DATE OF DISCHARGE:                                CONSULTATION patient of Dr. Sandria [name], GNA   [name] is a 45 year old Caucasian right-handed female, date of birth 1965/09/30, presented today at 11:30 to the [facility] ED. She had been at her dentist this morning and after she had supposedly 5 seizures at the dentist's office at around 10:30 a.m., she was brought by EMS to the [facility] ED.  Her brother states she had seven more seizures in the emergency room.  [name] has been seen in the past at [facility] by Dr. Sandria [name].  She had multiple admissions for seizures before and ED visits that did not always lead to admissions.  The patient had often spells that appeared nonepileptic by the clinical description and she has in the past been treated for depression and had at least one suicide attempts by overdosing on medications.  Her seizures have been of unusual duration of several different kinds, anything from atonic drop attacks to stares to generalized tonic clonic seizures and have been characterized at times when the patient being responsive to verbal commands or even protesting in the midst of one of her seizures.   She overheard a conversation.  She has produced alternating " bicyclic" movements and one of the spells that was witnessed by a fellow  neurologist was described as the patient having alternating left/ right boxing movements while her eyes were closed.  A consultation in 2010 by my colleague, Dr. Kelli [name] is very descriptive.  The patient had fallen on August 21, 2010 and bumped her head.  On August 20, 2010, she had supposedly briefly lost consciousness while in her mother's garden.   Her husband called out to her and she "awoke."  This led to the mixed diagnosis of seizure versus syncope for today's admission.  Again, the patient had supposedly 12 seizures today, but she is comfortably resting in bed.  She received Klonopin and Ativan.  REVIEW OF SYSTEMS:  Loss of consciousness, fall, anxiety, depression. No headaches.  No chest pain.  No fever, cough, UTIs.  PAST MEDICAL HISTORY:  Nonepileptic spells and presumably epileptic spells as well.  I have not found an EEG confirming either form on the [facility] electronic medical record and I cannot access any report from an EMU stay.  The patient was supposedly monitored at the Epilepsy Monitoring Unit at [facility] 4 years ago.  The patient is maintained on anti-seizure medication, which does not necessarily mean that she has epilepsy.  She could have both medications for mood changes or depression.  Past medical history positive for suicide attempts, dissociative fugues with amnestic spells, frequent bronchitis, and urinary incontinence.  SOCIAL HISTORY:  The patient is a nondrinker, nonsmoker.  She is married, with two adult sons.  On son was recently incarcerated and has a substance abuse history.  The patient claims that she is  disabled because of her" seizure" disorder.  She lives with her husband of 24 years, who has recently been diagnosed with leukemia or lymphoma.  The patient's brother made me aware that her husband is on narcotic pain medication and he indicated that the patient has in the past liked to take narcotics.  FAMILY HISTORY:  The father died of renal failure at age 17.  Mother is 36, had ovarian cancer.  The patient has a 93 year old brother and a 51- year-old brother.  She has sons born in 66 and 41, and her younger son became a  father at age 64.  Medication :  Laboratory:  Imaging study:  VS are stable , temp is 98.4 fahrenheit, BP 123/87 and HR 89 in NSR- none of her vital  signs were altered during the "witnessed seizure" Patient is alert and oriented, talkative in bed , giving her medical history, always using the term "seizures" not spells .  full EOM,  no facial assymmetry , full extremity movements , DTR symmetric, sensory intact to primary modalities, finger to nose finger is perform well, but the patient is talkative and not concentrating much on the task at hand. Stable unassisted able  to sit up in bed and the bedside. gait testing was deferred, patient able to stand , reports swaying feeling, but is stable.   reviewed her EMR records, labs and CT - reportedly, this patient had been seen in the [facility] EMU 7 or 8 years ago, I will ask for a report to be attached to her EMR. EEG repeat ordered.  Patient should be able to leave the 3100 floor in AM. I asked for Ativan not to be given unless the attending physician ( [facility] hospitalist ) is called and confirms order. D/C prn order.  Assesment: there a features of this patients examination and history that could indicate a non organic seizure, non-epileptic spells. Recent stressors seemed to have exacerbated the activity and have done so in the past.  Plan: Psychiatric /psycological follow up is indicated for stress management. Obtain outside records through [facility] medial center. D/c benzodiazepines and initiate strict anti epileptic medication regimen if EEG abnormal or past reports indicate abnormalities.          Dr [name] [facility] EMU - Dr [name]     CD/MEDQ  D:  08/22/2010  T:  08/23/2010  Job:  [id number]  Electronically Signed by [name] M.D. on 10/02/2010 11:44:51 AM

## 2010-10-03 LAB — HEMATOCRIT: HCT: 30 — ABNORMAL LOW

## 2010-10-08 ENCOUNTER — Emergency Department (HOSPITAL_COMMUNITY)
Admission: EM | Admit: 2010-10-08 | Discharge: 2010-10-08 | Disposition: A | Discharge status: Home / Self Care | Payer: Medicare Other | Attending: Emergency Medicine | Admitting: Emergency Medicine

## 2010-10-08 DIAGNOSIS — F329 Major depressive disorder, single episode, unspecified: Secondary | ICD-10-CM | POA: Insufficient documentation

## 2010-10-08 DIAGNOSIS — F3289 Other specified depressive episodes: Secondary | ICD-10-CM | POA: Insufficient documentation

## 2010-10-08 DIAGNOSIS — R42 Dizziness and giddiness: Secondary | ICD-10-CM | POA: Insufficient documentation

## 2010-10-08 DIAGNOSIS — G40909 Epilepsy, unspecified, not intractable, without status epilepticus: Secondary | ICD-10-CM | POA: Insufficient documentation

## 2010-10-08 DIAGNOSIS — E860 Dehydration: Secondary | ICD-10-CM | POA: Insufficient documentation

## 2010-10-08 LAB — DIFFERENTIAL
Basophils Absolute: 0 10*3/uL (ref 0.0–0.1)
Basophils Relative: 0 % (ref 0–1)
Eosinophils Absolute: 0 10*3/uL (ref 0.0–0.7)
Eosinophils Relative: 0 % (ref 0–5)
Lymphocytes Relative: 19 % (ref 12–46)
Lymphs Abs: 2.2 10*3/uL (ref 0.7–4.0)
Monocytes Absolute: 0.6 10*3/uL (ref 0.1–1.0)
Monocytes Relative: 5 % (ref 3–12)
Neutro Abs: 8.8 10*3/uL — ABNORMAL HIGH (ref 1.7–7.7)
Neutrophils Relative %: 76 % (ref 43–77)

## 2010-10-08 LAB — COMPREHENSIVE METABOLIC PANEL
ALT: 55 U/L — ABNORMAL HIGH (ref 0–35)
AST: 35 U/L (ref 0–37)
Albumin: 3.8 g/dL (ref 3.5–5.2)
Alkaline Phosphatase: 70 U/L (ref 39–117)
BUN: 15 mg/dL (ref 6–23)
CO2: 23 mEq/L (ref 19–32)
Calcium: 9.4 mg/dL (ref 8.4–10.5)
Chloride: 103 mEq/L (ref 96–112)
Creatinine, Ser: 0.67 mg/dL (ref 0.50–1.10)
GFR calc Af Amer: >60 mL/min (ref >60)
GFR calc non Af Amer: >60 mL/min (ref >60)
Glucose, Bld: 118 mg/dL — ABNORMAL HIGH (ref 70–99)
Potassium: 4.2 mEq/L (ref 3.5–5.1)
Sodium: 136 mEq/L (ref 135–145)
Total Bilirubin: 0.2 mg/dL — ABNORMAL LOW (ref 0.3–1.2)
Total Protein: 6.9 g/dL (ref 6.0–8.3)

## 2010-10-08 LAB — URINALYSIS, ROUTINE W REFLEX MICROSCOPIC
Bilirubin Urine: NEGATIVE
Glucose, UA: NEGATIVE mg/dL
Hgb urine dipstick: NEGATIVE
Ketones, ur: NEGATIVE mg/dL
Leukocytes, UA: NEGATIVE
Nitrite: NEGATIVE
Protein, ur: NEGATIVE mg/dL
Specific Gravity, Urine: 1.017 (ref 1.005–1.030)
Urobilinogen, UA: 0.2 mg/dL (ref 0.0–1.0)
pH: 7.5 (ref 5.0–8.0)

## 2010-10-08 LAB — RAPID URINE DRUG SCREEN, HOSP PERFORMED
Amphetamines: NOT DETECTED
Barbiturates: NOT DETECTED
Benzodiazepines: NOT DETECTED
Cocaine: NOT DETECTED
Opiates: NOT DETECTED
Tetrahydrocannabinol: NOT DETECTED

## 2010-10-08 LAB — ETHANOL: Alcohol, Ethyl (B): <11 mg/dL (ref 0–11)

## 2010-10-08 LAB — CBC
HCT: 37.9 % (ref 36.0–46.0)
Hemoglobin: 13 g/dL (ref 12.0–15.0)
MCH: 31.2 pg (ref 26.0–34.0)
MCHC: 34.3 g/dL (ref 30.0–36.0)
MCV: 90.9 fL (ref 78.0–100.0)
Platelets: 292 10*3/uL (ref 150–400)
RBC: 4.17 MIL/uL (ref 3.87–5.11)
RDW: 12.5 % (ref 11.5–15.5)
WBC: 11.6 10*3/uL — ABNORMAL HIGH (ref 4.0–10.5)

## 2010-10-08 LAB — POCT PREGNANCY, URINE: Preg Test, Ur: NEGATIVE

## 2010-10-12 ENCOUNTER — Encounter (HOSPITAL_COMMUNITY): Payer: Self-pay | Admitting: *Deleted

## 2010-10-12 ENCOUNTER — Emergency Department (HOSPITAL_COMMUNITY)
Admission: EM | Admit: 2010-10-12 | Discharge: 2010-10-13 | Disposition: A | Discharge status: Home / Self Care | Payer: Medicare Other | Attending: Emergency Medicine | Admitting: Emergency Medicine

## 2010-10-12 DIAGNOSIS — F419 Anxiety disorder, unspecified: Secondary | ICD-10-CM

## 2010-10-12 DIAGNOSIS — R569 Unspecified convulsions: Secondary | ICD-10-CM | POA: Insufficient documentation

## 2010-10-12 DIAGNOSIS — F411 Generalized anxiety disorder: Secondary | ICD-10-CM | POA: Insufficient documentation

## 2010-10-12 HISTORY — DX: Dizziness and giddiness: R42

## 2010-10-12 HISTORY — DX: Unspecified convulsions: R56.9

## 2010-10-12 LAB — DIFFERENTIAL
Basophils Absolute: 0 10*3/uL (ref 0.0–0.1)
Basophils Relative: 0 % (ref 0–1)
Eosinophils Absolute: 0.1 10*3/uL (ref 0.0–0.7)
Eosinophils Relative: 1 % (ref 0–5)
Lymphocytes Relative: 22 % (ref 12–46)
Lymphs Abs: 2 10*3/uL (ref 0.7–4.0)
Monocytes Absolute: 0.7 10*3/uL (ref 0.1–1.0)
Monocytes Relative: 7 % (ref 3–12)
Neutro Abs: 6.5 10*3/uL (ref 1.7–7.7)
Neutrophils Relative %: 70 % (ref 43–77)

## 2010-10-12 LAB — BASIC METABOLIC PANEL
BUN: 12 mg/dL (ref 6–23)
CO2: 25 mEq/L (ref 19–32)
Calcium: 9.1 mg/dL (ref 8.4–10.5)
Chloride: 104 mEq/L (ref 96–112)
Creatinine, Ser: 0.78 mg/dL (ref 0.50–1.10)
GFR calc Af Amer: >60 mL/min (ref >60)
GFR calc non Af Amer: >60 mL/min (ref >60)
Glucose, Bld: 88 mg/dL (ref 70–99)
Potassium: 3.8 mEq/L (ref 3.5–5.1)
Sodium: 138 mEq/L (ref 135–145)

## 2010-10-12 LAB — CBC
HCT: 35.7 % — ABNORMAL LOW (ref 36.0–46.0)
Hemoglobin: 12 g/dL (ref 12.0–15.0)
MCH: 31.1 pg (ref 26.0–34.0)
MCHC: 33.6 g/dL (ref 30.0–36.0)
MCV: 92.5 fL (ref 78.0–100.0)
Platelets: 289 10*3/uL (ref 150–400)
RBC: 3.86 MIL/uL — ABNORMAL LOW (ref 3.87–5.11)
RDW: 12.7 % (ref 11.5–15.5)
WBC: 9.3 10*3/uL (ref 4.0–10.5)

## 2010-10-12 MED ORDER — SODIUM CHLORIDE 0.9 % IV BOLUS (SEPSIS)
1000.0000 mL | Freq: Once | INTRAVENOUS | Status: AC
  Administered 2010-10-12: 1000 mL via INTRAVENOUS

## 2010-10-12 MED ORDER — LAMOTRIGINE 25 MG PO TABS
ORAL_TABLET | ORAL | Status: AC
  Filled 2010-10-12: qty 8

## 2010-10-12 MED ORDER — LORAZEPAM 1 MG PO TABS: 1.0000 mg | ORAL_TABLET | Freq: Three times a day (TID) | ORAL | Status: AC | PRN

## 2010-10-12 MED ORDER — LAMOTRIGINE 200 MG PO TABS
200.0000 mg | ORAL_TABLET | Freq: Two times a day (BID) | ORAL | Status: DC
  Administered 2010-10-12: 200 mg via ORAL
  Filled 2010-10-12 (×4): qty 1

## 2010-10-12 MED ORDER — LORAZEPAM 2 MG/ML IJ SOLN
1.0000 mg | Freq: Once | INTRAMUSCULAR | Status: AC
  Administered 2010-10-12: 2 mg via INTRAVENOUS
  Filled 2010-10-12: qty 1

## 2010-10-12 NOTE — ED Notes (Signed)
Called supervisor for med due to not being in pyxis

## 2010-10-12 NOTE — ED Notes (Signed)
Reported that pt had a seizure PTA, pt alert and oriented to person, place, month and year; pt denies any new pain due to seizure, pt stuck out tongue and no bite marks noted, MAE's without difficulty

## 2010-10-12 NOTE — ED Notes (Signed)
Patient's linens changed due to getting wet from using female urinal, patient cleaned up as well

## 2010-10-12 NOTE — ED Notes (Signed)
Pt has a history of seizures. EMS was called out because pt kept having seizures. Pt usually has 1 seizure and is given Ativan and pt responds well to this med. Tonight pt had a seizure prior to ems arrival and once they got there EMS states pt was actively seizing, pt did not void on herself. EMS states pt had a total of 4 seizure like episodes while they were on scene. EMS gave pt 2 mg Ativan IV. Pt does report that the doctors are switching her seizure medications up.

## 2010-10-12 NOTE — ED Provider Notes (Addendum)
History     CSN: [id number] Arrival date & time: 10/12/2010  7:42 PM  Chief Complaint  Patient presents with  . Seizures     HPI  Pt has a history of seizures. EMS was called out because pt kept having seizures. Pt usually has 1 seizure and is given Ativan and pt responds well to this med. Tonight pt had a seizure prior to ems arrival and once they got there EMS states pt was actively seizing, pt did not void on herself. EMS states pt had a total of 4 seizure like episodes while they were on scene. EMS gave pt 2 mg Ativan IV. Pt does report that the doctors are switching her seizure medications up.  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 Vickey Huger.  Numerous MRI and CT scans of the head done in the past.   Past Medical History  Diagnosis Date  . Epilepsy   . Seizures   . Vertigo   . Constipation     Past Surgical History  Procedure Date  . Total hip arthroplasty   . Abdominal hysterectomy   . Colonoscopy     Family History  Problem Relation Age of Onset  . Cancer Mother   . Cancer Brother   . Diabetes Brother   . Cancer Maternal Aunt   . Diabetes Maternal Aunt     History  Substance Use Topics  . Smoking status: Never Smoker   . Smokeless tobacco: Not on file  . Alcohol Use: No    OB History    Grav Para Term Preterm Abortions TAB SAB Ect Mult Living                  Review of Systems  Neurological: Negative for headaches.  All other systems reviewed and are negative.    Allergies  Codeine; Latex; Morphine and related; Penicillins; and Ultram  Home Medications  No current outpatient prescriptions on file.  BP 121/80  Pulse 102  Temp(Src) 99.6 F (37.6 C) (Oral)  Resp 20  SpO2 97%  Physical Exam  Nursing note and vitals reviewed. Constitutional: She is oriented to person, place, and time. She appears well-developed and well-nourished. No distress.  HENT:  Head: Normocephalic and atraumatic.  Eyes: Pupils are equal, round,  and reactive to light.  Neck: Normal range of motion. Neck supple. No tracheal deviation present. No thyromegaly present.  Cardiovascular: Normal rate and intact distal pulses.   Pulmonary/Chest: No respiratory distress. She has no wheezes.  Abdominal: Normal appearance. She exhibits no distension.  Musculoskeletal: Normal range of motion. She exhibits no edema.  Neurological: She is alert and oriented to person, place, and time. She exhibits normal muscle tone. Coordination normal.  Skin: Skin is warm and dry. No rash noted.  Psychiatric: She has a normal mood and affect. Her behavior is normal.    ED Course  Procedures (including critical care time)  Labs Reviewed  CBC - Abnormal; Notable for the following:    RBC 3.86 (*)    HCT 35.7 (*)    All other components within normal limits  DIFFERENTIAL  BASIC METABOLIC PANEL      MDM          Nelia Shi, MD 10/12/10 2130  Nelia Shi, MD 11/08/10 [phone]

## 2010-10-13 NOTE — ED Notes (Signed)
Pt left er stating no needs 

## 2010-10-14 DIAGNOSIS — F329 Major depressive disorder, single episode, unspecified: Secondary | ICD-10-CM | POA: Insufficient documentation

## 2010-10-15 LAB — DIFFERENTIAL
Basophils Absolute: 0
Basophils Relative: 0
Eosinophils Absolute: 0
Eosinophils Relative: 0
Lymphocytes Relative: 17
Lymphs Abs: 1.2
Monocytes Absolute: 0.4
Monocytes Relative: 5
Neutro Abs: 5.3
Neutrophils Relative %: 77

## 2010-10-15 LAB — BASIC METABOLIC PANEL
BUN: 12
BUN: 9
CO2: 21
CO2: 22
Calcium: 8.2 — ABNORMAL LOW
Calcium: 9.3
Chloride: 109
Chloride: 115 — ABNORMAL HIGH
Creatinine, Ser: 0.57
Creatinine, Ser: 0.63
GFR calc Af Amer: >60
GFR calc Af Amer: >60
GFR calc non Af Amer: >60
GFR calc non Af Amer: >60
Glucose, Bld: 121 — ABNORMAL HIGH
Glucose, Bld: 122 — ABNORMAL HIGH
Potassium: 3.1 — ABNORMAL LOW
Potassium: 3.2 — ABNORMAL LOW
Sodium: 138
Sodium: 140

## 2010-10-15 LAB — ACETAMINOPHEN LEVEL
Acetaminophen (Tylenol), Serum: 22.5
Acetaminophen (Tylenol), Serum: 47.5 — ABNORMAL HIGH

## 2010-10-15 LAB — CBC
HCT: 36
Hemoglobin: 12.3
MCHC: 34.1
MCV: 93.5
Platelets: 257
RBC: 3.85 — ABNORMAL LOW
RDW: 12.2
WBC: 6.9

## 2010-10-15 LAB — URINALYSIS, ROUTINE W REFLEX MICROSCOPIC
Bilirubin Urine: NEGATIVE
Glucose, UA: NEGATIVE
Hgb urine dipstick: NEGATIVE
Ketones, ur: 15 — AB
Nitrite: NEGATIVE
Protein, ur: NEGATIVE
Specific Gravity, Urine: 1.025
Urobilinogen, UA: 0.2
pH: 5.5

## 2010-10-15 LAB — SALICYLATE LEVEL
Salicylate Lvl: 22.9 — ABNORMAL HIGH
Salicylate Lvl: 31.3

## 2010-10-15 LAB — RAPID URINE DRUG SCREEN, HOSP PERFORMED
Amphetamines: NOT DETECTED
Barbiturates: NOT DETECTED
Benzodiazepines: POSITIVE — AB
Cocaine: NOT DETECTED
Opiates: NOT DETECTED
Tetrahydrocannabinol: NOT DETECTED

## 2010-10-15 LAB — PROTIME-INR
INR: 1
Prothrombin Time: 13

## 2010-10-15 LAB — MAGNESIUM: Magnesium: 2.1

## 2010-10-15 LAB — PREGNANCY, URINE: Preg Test, Ur: NEGATIVE

## 2010-10-16 LAB — GLUCOSE, CAPILLARY: Glucose-Capillary: 130 mg/dL — ABNORMAL HIGH (ref 70–99)

## 2010-10-22 LAB — POCT HEMOGLOBIN-HEMACUE
Hemoglobin: 13
Operator id: 268271

## 2010-12-26 ENCOUNTER — Other Ambulatory Visit: Payer: Self-pay

## 2010-12-26 ENCOUNTER — Emergency Department (HOSPITAL_COMMUNITY): Payer: Medicare Other

## 2010-12-26 ENCOUNTER — Emergency Department (HOSPITAL_COMMUNITY)
Admission: EM | Admit: 2010-12-26 | Discharge: 2010-12-26 | Disposition: A | Discharge status: Home / Self Care | Payer: Medicare Other | Attending: Emergency Medicine | Admitting: Emergency Medicine

## 2010-12-26 ENCOUNTER — Encounter (HOSPITAL_COMMUNITY): Payer: Self-pay

## 2010-12-26 DIAGNOSIS — J101 Influenza due to other identified influenza virus with other respiratory manifestations: Secondary | ICD-10-CM

## 2010-12-26 DIAGNOSIS — J111 Influenza due to unidentified influenza virus with other respiratory manifestations: Secondary | ICD-10-CM | POA: Insufficient documentation

## 2010-12-26 DIAGNOSIS — R05 Cough: Secondary | ICD-10-CM | POA: Insufficient documentation

## 2010-12-26 DIAGNOSIS — R059 Cough, unspecified: Secondary | ICD-10-CM | POA: Insufficient documentation

## 2010-12-26 DIAGNOSIS — R569 Unspecified convulsions: Secondary | ICD-10-CM | POA: Insufficient documentation

## 2010-12-26 DIAGNOSIS — R509 Fever, unspecified: Secondary | ICD-10-CM | POA: Insufficient documentation

## 2010-12-26 DIAGNOSIS — G40909 Epilepsy, unspecified, not intractable, without status epilepticus: Secondary | ICD-10-CM

## 2010-12-26 LAB — URINALYSIS, ROUTINE W REFLEX MICROSCOPIC
Bilirubin Urine: NEGATIVE
Glucose, UA: NEGATIVE mg/dL
Hgb urine dipstick: NEGATIVE
Ketones, ur: NEGATIVE mg/dL
Leukocytes, UA: NEGATIVE
Nitrite: NEGATIVE
Protein, ur: NEGATIVE mg/dL
Specific Gravity, Urine: 1.011 (ref 1.005–1.030)
Urobilinogen, UA: 0.2 mg/dL (ref 0.0–1.0)
pH: 7 (ref 5.0–8.0)

## 2010-12-26 LAB — BASIC METABOLIC PANEL
BUN: 8 mg/dL (ref 6–23)
CO2: 28 mEq/L (ref 19–32)
Calcium: 9.1 mg/dL (ref 8.4–10.5)
Chloride: 98 mEq/L (ref 96–112)
Creatinine, Ser: 0.64 mg/dL (ref 0.50–1.10)
GFR calc Af Amer: >90 mL/min (ref >90)
GFR calc non Af Amer: >90 mL/min (ref >90)
Glucose, Bld: 104 mg/dL — ABNORMAL HIGH (ref 70–99)
Potassium: 4.2 mEq/L (ref 3.5–5.1)
Sodium: 136 mEq/L (ref 135–145)

## 2010-12-26 LAB — CBC
HCT: 35 % — ABNORMAL LOW (ref 36.0–46.0)
Hemoglobin: 11.7 g/dL — ABNORMAL LOW (ref 12.0–15.0)
MCH: 30 pg (ref 26.0–34.0)
MCHC: 33.4 g/dL (ref 30.0–36.0)
MCV: 89.7 fL (ref 78.0–100.0)
Platelets: 233 10*3/uL (ref 150–400)
RBC: 3.9 MIL/uL (ref 3.87–5.11)
RDW: 12.9 % (ref 11.5–15.5)
WBC: 9.5 10*3/uL (ref 4.0–10.5)

## 2010-12-26 LAB — DIFFERENTIAL
Basophils Absolute: 0 10*3/uL (ref 0.0–0.1)
Basophils Relative: 0 % (ref 0–1)
Eosinophils Absolute: 0 10*3/uL (ref 0.0–0.7)
Eosinophils Relative: 0 % (ref 0–5)
Lymphocytes Relative: 20 % (ref 12–46)
Lymphs Abs: 1.9 10*3/uL (ref 0.7–4.0)
Monocytes Absolute: 0.8 10*3/uL (ref 0.1–1.0)
Monocytes Relative: 9 % (ref 3–12)
Neutro Abs: 6.7 10*3/uL (ref 1.7–7.7)
Neutrophils Relative %: 71 % (ref 43–77)

## 2010-12-26 MED ORDER — KETOROLAC TROMETHAMINE 30 MG/ML IJ SOLN
INTRAMUSCULAR | Status: AC
  Administered 2010-12-26: 30 mg via INTRAVENOUS
  Filled 2010-12-26: qty 1

## 2010-12-26 MED ORDER — KETOROLAC TROMETHAMINE 30 MG/ML IJ SOLN
30.0000 mg | Freq: Once | INTRAMUSCULAR | Status: AC
  Administered 2010-12-26: 30 mg via INTRAVENOUS

## 2010-12-26 MED ORDER — LACOSAMIDE 50 MG PO TABS: 100.0000 mg | ORAL_TABLET | Freq: Once | ORAL | Status: DC

## 2010-12-26 MED ORDER — SODIUM CHLORIDE 0.9 % IV SOLN: INTRAVENOUS | Status: DC

## 2010-12-26 MED ORDER — LACOSAMIDE 100 MG PO TABS: 1.0000 | ORAL_TABLET | Freq: Two times a day (BID) | ORAL | Status: DC

## 2010-12-26 MED ORDER — LAMOTRIGINE 100 MG PO TABS
200.0000 mg | ORAL_TABLET | Freq: Two times a day (BID) | ORAL | Status: DC
  Administered 2010-12-26: 200 mg via ORAL
  Filled 2010-12-26: qty 2

## 2010-12-26 MED ORDER — OSELTAMIVIR PHOSPHATE 75 MG PO CAPS: 75.0000 mg | ORAL_CAPSULE | Freq: Two times a day (BID) | ORAL | Status: DC

## 2010-12-26 MED ORDER — LAMOTRIGINE 200 MG PO TABS
200.0000 mg | ORAL_TABLET | Freq: Once | ORAL | Status: DC
  Filled 2010-12-26: qty 1

## 2010-12-26 MED ORDER — ACETAMINOPHEN 325 MG PO TABS
650.0000 mg | ORAL_TABLET | Freq: Once | ORAL | Status: AC
  Administered 2010-12-26: 650 mg via ORAL
  Filled 2010-12-26: qty 2

## 2010-12-26 MED ORDER — SODIUM CHLORIDE 0.9 % IV BOLUS (SEPSIS)
1000.0000 mL | Freq: Once | INTRAVENOUS | Status: AC
  Administered 2010-12-26: 1000 mL via INTRAVENOUS

## 2010-12-26 NOTE — ED Notes (Signed)
MD notified that pt reports headache.

## 2010-12-26 NOTE — ED Provider Notes (Signed)
History     CSN: [id number] Arrival date & time: 12/26/2010  6:21 PM   First MD Initiated Contact with Patient 12/26/10 1857      Chief Complaint  Patient presents with  . Seizures    (Consider location/radiation/quality/duration/timing/severity/associated sxs/prior treatment) Patient is a 45 y.o. female presenting with seizures. The history is provided by the patient.  Seizures  This is a recurrent problem. The current episode started 1 to 2 hours ago. The problem has been rapidly improving. There was 1 seizure. Associated symptoms include cough. Pertinent negatives include no confusion, no headaches, no speech difficulty, no nausea, no vomiting, no diarrhea and no muscle weakness. The maximum temperature recorded prior to her arrival was 101 to 101.9 F. The fever has been present for 3 to 4 days.   she has been ill for several days. She went to see her PCP today. She had an influenza swab is reported as positive for influenza A. While in the doctor's office today. She had a seizure, which is typical of her usual seizures. She last had a seizure about one month ago. She had an influenza vaccine 1 week ago.  Past Medical History  Diagnosis Date  . Epilepsy   . Seizures   . Vertigo   . Constipation     Past Surgical History  Procedure Date  . Total hip arthroplasty   . Abdominal hysterectomy   . Colonoscopy     Family History  Problem Relation Age of Onset  . Cancer Mother   . Cancer Brother   . Diabetes Brother   . Cancer Maternal Aunt   . Diabetes Maternal Aunt     History  Substance Use Topics  . Smoking status: Never Smoker   . Smokeless tobacco: Not on file  . Alcohol Use: No    OB History    Grav Para Term Preterm Abortions TAB SAB Ect Mult Living                  Review of Systems  Respiratory: Positive for cough.   Gastrointestinal: Negative for nausea, vomiting and diarrhea.  Neurological: Positive for seizures. Negative for speech difficulty and  headaches.  Psychiatric/Behavioral: Negative for confusion.  All other systems reviewed and are negative.    Allergies  Ultram; Vicodin; Latex; and Penicillins  Home Medications   Current Outpatient Rx  Name Route Sig Dispense Refill  . CLONAZEPAM 0.5 MG PO TABS Oral Take 0.5 mg by mouth at bedtime.      Marland Kitchen LACOSAMIDE 100 MG PO TABS Oral Take 1 tablet by mouth 2 (two) times daily.      Marland Kitchen LAMOTRIGINE 200 MG PO TABS Oral Take 200 mg by mouth 2 (two) times daily.      Marland Kitchen PAROXETINE HCL 20 MG PO TABS Oral Take 20 mg by mouth at bedtime.      . OSELTAMIVIR PHOSPHATE 75 MG PO CAPS Oral Take 75 mg by mouth every 12 (twelve) hours.        BP 102/55  Pulse 95  Temp(Src) 99.5 F (37.5 C) (Oral)  Resp 18  SpO2 96%  Physical Exam  Constitutional: She is oriented to person, place, and time. She appears well-developed and well-nourished.  HENT:  Head: Normocephalic and atraumatic.  Eyes: EOM are normal. Pupils are equal, round, and reactive to light.  Neck: Normal range of motion. Neck supple. No tracheal deviation present.  Cardiovascular: Normal rate and regular rhythm.   Pulmonary/Chest: Effort normal and breath sounds  normal. No stridor. No respiratory distress. She has no wheezes. She has no rales.       She has generalized rhonchi and coarse airway sounds  Abdominal: Soft. Bowel sounds are normal.  Musculoskeletal: Normal range of motion. She exhibits no edema and no tenderness.  Lymphadenopathy:    She has no cervical adenopathy.  Neurological: She is alert and oriented to person, place, and time. No cranial nerve deficit. She exhibits normal muscle tone. Coordination normal.  Skin: Skin is warm and dry.  Psychiatric: She has a normal mood and affect. Her behavior is normal. Judgment and thought content normal.    ED Course  Procedures (including critical care time)  Labs Reviewed  CBC - Abnormal; Notable for the following:    Hemoglobin 11.7 (*)    HCT 35.0 (*)    All  other components within normal limits  BASIC METABOLIC PANEL - Abnormal; Notable for the following:    Glucose, Bld 104 (*)    All other components within normal limits  DIFFERENTIAL  URINALYSIS, ROUTINE W REFLEX MICROSCOPIC  URINE CULTURE  LAB REPORT - SCANNED   Ct Angio Head W/cm &/or Wo Cm  12/29/2010  *RADIOLOGY REPORT*  Clinical Data:  History of epilepsy.  Dizziness.  Altered level of consciousness.  Headaches.  Question basilar stenosis?  CT ANGIOGRAPHY HEAD AND NECK  Technique:  Multidetector CT imaging of the head and neck was performed using the standard protocol during bolus administration of intravenous contrast.  Multiplanar CT image reconstructions including MIPs were obtained to evaluate the vascular anatomy. Carotid stenosis measurements (when applicable) are obtained utilizing NASCET criteria, using the distal internal carotid diameter as the denominator.  Contrast: 50mL OMNIPAQUE IOHEXOL 350 MG/ML IV SOLN  Comparison:  12/29/2010 MR brain.  CTA NECK  Findings:  Fatty replacement of the right parotid gland.  Tiny calcifications with intraparotid lymph nodes/masses left parotid gland.  Increased number of normal to enlarged lymph nodes throughout the neck.  As an index lymph node, left level II lymph node measures 1.3 x 0.8 x 1.9 cm.  Although these lymph nodes may be reactive origin, tumor (lymphoma/head and neck) not excluded.  No evidence of carotid artery stenosis or dissection.  Ectatic distal vertical cervical segment of the internal carotid artery just proximal to the skull base bilaterally without pleated appearance as may be seen with fibromuscular dysplasia.  Evaluation of the proximal aspect of the right vertebral artery is limited by streak artery from injected contrast.  No obvious narrowing or dissection of either vertebral artery.  Cervical spondylotic changes most prominent C6-7.   Review of the MIP images confirms the above findings.  IMPRESSION: No evidence of carotid  artery stenosis or dissection.  Ectatic distal vertical cervical segment of the internal carotid artery just proximal to the skull base bilaterally without pleated appearance as may be seen with fibromuscular dysplasia.  Evaluation of the proximal aspect of the right vertebral artery is limited by streak artery from injected contrast.  No obvious narrowing or dissection of either vertebral artery.  Fatty replacement of the right parotid gland.  Tiny calcifications with intraparotid lymph nodes/masses left parotid gland.  Increased number of normal to enlarged lymph nodes throughout the neck.  As an index lymph node, left level II lymph node measures 1.3 x 0.8 x 1.9 cm.  Although these lymph nodes may be reactive origin, tumor (lymphoma/head and neck) not excluded.  CTA HEAD  Findings:  No intracranial hemorrhage.  No CT evidence of  large acute infarct.  No intracranial enhancing lesion.  No hydrocephalus.  Previously questioned atrophy not as apparent on the present exam.  No significant stenosis of the codominant vertebral arteries. PICAs visualized.  Mild irregularity of the basilar artery without high-grade stenosis.  Both AICAs visualized.  No significant narrowing of the superior cerebellar arteries or posterior cerebral arteries.  Anterior circulation without to medium or large size vessel narrowing or occlusion. Mild calcification cavernous segment of the internal carotid artery bilaterally.  No aneurysm detected.  Mucosal thickening maxillary sinuses and ethmoid sinus air cells.   Review of the MIP images confirms the above findings.  IMPRESSION: No intracranial medium or large size vessel significant stenosis or occlusion as detailed above.  Original Report Authenticated By: Fuller Canada, M.D.   Dg Chest 2 View  12/29/2010  *RADIOLOGY REPORT*  Clinical Data: Dizziness and weakness  CHEST - 2 VIEW  Comparison: 12/26/2010  Findings: Heart size is normal.  No pleural effusion or pulmonary edema.  No  airspace consolidation identified.   Review of the visualized osseous structures is negative.  IMPRESSION:  1.  No acute cardiopulmonary abnormalities.  Original Report Authenticated By: Rosealee Albee, M.D.   Ct Angio Neck W/cm &/or Wo/cm  12/29/2010  *RADIOLOGY REPORT*  Clinical Data:  History of epilepsy.  Dizziness.  Altered level of consciousness.  Headaches.  Question basilar stenosis?  CT ANGIOGRAPHY HEAD AND NECK  Technique:  Multidetector CT imaging of the head and neck was performed using the standard protocol during bolus administration of intravenous contrast.  Multiplanar CT image reconstructions including MIPs were obtained to evaluate the vascular anatomy. Carotid stenosis measurements (when applicable) are obtained utilizing NASCET criteria, using the distal internal carotid diameter as the denominator.  Contrast: 50mL OMNIPAQUE IOHEXOL 350 MG/ML IV SOLN  Comparison:  12/29/2010 MR brain.  CTA NECK  Findings:  Fatty replacement of the right parotid gland.  Tiny calcifications with intraparotid lymph nodes/masses left parotid gland.  Increased number of normal to enlarged lymph nodes throughout the neck.  As an index lymph node, left level II lymph node measures 1.3 x 0.8 x 1.9 cm.  Although these lymph nodes may be reactive origin, tumor (lymphoma/head and neck) not excluded.  No evidence of carotid artery stenosis or dissection.  Ectatic distal vertical cervical segment of the internal carotid artery just proximal to the skull base bilaterally without pleated appearance as may be seen with fibromuscular dysplasia.  Evaluation of the proximal aspect of the right vertebral artery is limited by streak artery from injected contrast.  No obvious narrowing or dissection of either vertebral artery.  Cervical spondylotic changes most prominent C6-7.   Review of the MIP images confirms the above findings.  IMPRESSION: No evidence of carotid artery stenosis or dissection.  Ectatic distal vertical  cervical segment of the internal carotid artery just proximal to the skull base bilaterally without pleated appearance as may be seen with fibromuscular dysplasia.  Evaluation of the proximal aspect of the right vertebral artery is limited by streak artery from injected contrast.  No obvious narrowing or dissection of either vertebral artery.  Fatty replacement of the right parotid gland.  Tiny calcifications with intraparotid lymph nodes/masses left parotid gland.  Increased number of normal to enlarged lymph nodes throughout the neck.  As an index lymph node, left level II lymph node measures 1.3 x 0.8 x 1.9 cm.  Although these lymph nodes may be reactive origin, tumor (lymphoma/head and neck) not excluded.  CTA  HEAD  Findings:  No intracranial hemorrhage.  No CT evidence of large acute infarct.  No intracranial enhancing lesion.  No hydrocephalus.  Previously questioned atrophy not as apparent on the present exam.  No significant stenosis of the codominant vertebral arteries. PICAs visualized.  Mild irregularity of the basilar artery without high-grade stenosis.  Both AICAs visualized.  No significant narrowing of the superior cerebellar arteries or posterior cerebral arteries.  Anterior circulation without to medium or large size vessel narrowing or occlusion. Mild calcification cavernous segment of the internal carotid artery bilaterally.  No aneurysm detected.  Mucosal thickening maxillary sinuses and ethmoid sinus air cells.   Review of the MIP images confirms the above findings.  IMPRESSION: No intracranial medium or large size vessel significant stenosis or occlusion as detailed above.  Original Report Authenticated By: Fuller Canada, M.D.   Mr Brain Wo Contrast  12/29/2010  *RADIOLOGY REPORT*  Clinical Data: Dizziness with unsteady gait.  Questionable seizures.  MRI HEAD WITHOUT CONTRAST  Technique:  Multiplanar, multiecho pulse sequences of the brain and surrounding structures were obtained  according to standard protocol without intravenous contrast.  Comparison: 08/22/2010 MR and CT.  Findings: Motion degraded exam.  No acute infarct.  No intracranial hemorrhage.  Mild global atrophy without hydrocephalus.  No intracranial mass lesion or seizure focus detected on this unenhanced motion degraded exam.  Major intracranial vascular structures appear grossly patent.  Minimal paranasal sinus mucosal thickening.  IMPRESSION: Motion degraded exam without acute abnormality.  Please see above.  Original Report Authenticated By: Fuller Canada, M.D.     1. Influenza A   2. Seizure disorder       MDM  Patient has signs, and symptoms of influenza. Reported seizure, not recurrent in ED. She is baseline seizure disorder. Patient was treated with ED and improved. At time of discharge no evidence for metabolic instability , or systemic illness aside from influenza.       Flint Melter, MD 12/30/10 [phone]

## 2010-12-26 NOTE — ED Notes (Signed)
X-ray at bedside

## 2010-12-26 NOTE — ED Notes (Signed)
Pt reported that she will follow d/c instructions and take prescriptions as prescribed. No signs of distress; A&Ox3; ambulated with a steady gait; respirations even and unlabored; skin warm and dry.

## 2010-12-26 NOTE — ED Notes (Signed)
Pt arrived via EMS, was at PMDs office of cold symptoms, states that she had a seizure witnessed by the staff

## 2010-12-26 NOTE — ED Notes (Signed)
Pt ambulated to the restroom without distress.

## 2010-12-26 NOTE — ED Notes (Signed)
MD at bedside. 

## 2010-12-26 NOTE — ED Notes (Signed)
MD aware that lacosamide not availabel at our pharmacy. MD instructed pt to take it when she got home.

## 2010-12-26 NOTE — ED Notes (Signed)
Pt ambulated to and from restroom without difficulty.   

## 2010-12-26 NOTE — ED Notes (Signed)
Rn introduced self to pt. No signs of distress.

## 2010-12-27 LAB — URINE CULTURE
Colony Count: >=100000
Culture  Setup Time: 201212132104

## 2010-12-29 ENCOUNTER — Emergency Department (HOSPITAL_COMMUNITY): Payer: Medicare Other

## 2010-12-29 ENCOUNTER — Inpatient Hospital Stay (HOSPITAL_COMMUNITY)
Admission: EM | Admit: 2010-12-29 | Discharge: 2011-01-02 | DRG: 101 | Disposition: A | Discharge status: Home / Self Care | Payer: Medicare Other | Source: Ambulatory Visit | Attending: Internal Medicine | Admitting: Internal Medicine

## 2010-12-29 ENCOUNTER — Encounter (HOSPITAL_COMMUNITY): Payer: Self-pay | Admitting: Emergency Medicine

## 2010-12-29 ENCOUNTER — Other Ambulatory Visit: Payer: Self-pay

## 2010-12-29 DIAGNOSIS — Z880 Allergy status to penicillin: Secondary | ICD-10-CM

## 2010-12-29 DIAGNOSIS — R000 Tachycardia, unspecified: Secondary | ICD-10-CM | POA: Diagnosis present

## 2010-12-29 DIAGNOSIS — H8110 Benign paroxysmal vertigo, unspecified ear: Secondary | ICD-10-CM | POA: Diagnosis present

## 2010-12-29 DIAGNOSIS — R4182 Altered mental status, unspecified: Secondary | ICD-10-CM

## 2010-12-29 DIAGNOSIS — G40909 Epilepsy, unspecified, not intractable, without status epilepticus: Principal | ICD-10-CM | POA: Diagnosis present

## 2010-12-29 DIAGNOSIS — J101 Influenza due to other identified influenza virus with other respiratory manifestations: Secondary | ICD-10-CM | POA: Diagnosis present

## 2010-12-29 DIAGNOSIS — R32 Unspecified urinary incontinence: Secondary | ICD-10-CM | POA: Insufficient documentation

## 2010-12-29 DIAGNOSIS — Z886 Allergy status to analgesic agent status: Secondary | ICD-10-CM

## 2010-12-29 DIAGNOSIS — R569 Unspecified convulsions: Secondary | ICD-10-CM

## 2010-12-29 DIAGNOSIS — D72829 Elevated white blood cell count, unspecified: Secondary | ICD-10-CM | POA: Diagnosis present

## 2010-12-29 DIAGNOSIS — Z9181 History of falling: Secondary | ICD-10-CM

## 2010-12-29 DIAGNOSIS — R42 Dizziness and giddiness: Secondary | ICD-10-CM

## 2010-12-29 DIAGNOSIS — Z96649 Presence of unspecified artificial hip joint: Secondary | ICD-10-CM

## 2010-12-29 DIAGNOSIS — M25559 Pain in unspecified hip: Secondary | ICD-10-CM | POA: Diagnosis not present

## 2010-12-29 DIAGNOSIS — E876 Hypokalemia: Secondary | ICD-10-CM | POA: Diagnosis present

## 2010-12-29 DIAGNOSIS — R6510 Systemic inflammatory response syndrome (SIRS) of non-infectious origin without acute organ dysfunction: Secondary | ICD-10-CM | POA: Diagnosis present

## 2010-12-29 DIAGNOSIS — R45851 Suicidal ideations: Secondary | ICD-10-CM | POA: Insufficient documentation

## 2010-12-29 DIAGNOSIS — J09X2 Influenza due to identified novel influenza A virus with other respiratory manifestations: Secondary | ICD-10-CM | POA: Diagnosis present

## 2010-12-29 DIAGNOSIS — Z91040 Latex allergy status: Secondary | ICD-10-CM

## 2010-12-29 LAB — DIFFERENTIAL
Basophils Absolute: 0 10*3/uL (ref 0.0–0.1)
Basophils Relative: 0 % (ref 0–1)
Eosinophils Absolute: 0 10*3/uL (ref 0.0–0.7)
Eosinophils Relative: 0 % (ref 0–5)
Lymphocytes Relative: 12 % (ref 12–46)
Lymphs Abs: 2.1 10*3/uL (ref 0.7–4.0)
Monocytes Absolute: 1.4 10*3/uL — ABNORMAL HIGH (ref 0.1–1.0)
Monocytes Relative: 8 % (ref 3–12)
Neutro Abs: 14.9 10*3/uL — ABNORMAL HIGH (ref 1.7–7.7)
Neutrophils Relative %: 81 % — ABNORMAL HIGH (ref 43–77)

## 2010-12-29 LAB — CBC
HCT: 33.3 % — ABNORMAL LOW (ref 36.0–46.0)
HCT: 31.3 % — ABNORMAL LOW (ref 36.0–46.0)
Hemoglobin: 11.4 g/dL — ABNORMAL LOW (ref 12.0–15.0)
Hemoglobin: 10.7 g/dL — ABNORMAL LOW (ref 12.0–15.0)
MCH: 30.5 pg (ref 26.0–34.0)
MCH: 30.1 pg (ref 26.0–34.0)
MCHC: 34.2 g/dL (ref 30.0–36.0)
MCHC: 34.2 g/dL (ref 30.0–36.0)
MCV: 89 fL (ref 78.0–100.0)
MCV: 88.2 fL (ref 78.0–100.0)
Platelets: 219 10*3/uL (ref 150–400)
Platelets: 210 10*3/uL (ref 150–400)
RBC: 3.74 MIL/uL — ABNORMAL LOW (ref 3.87–5.11)
RBC: 3.55 MIL/uL — ABNORMAL LOW (ref 3.87–5.11)
RDW: 13 % (ref 11.5–15.5)
RDW: 13 % (ref 11.5–15.5)
WBC: 19.8 10*3/uL — ABNORMAL HIGH (ref 4.0–10.5)
WBC: 18.5 10*3/uL — ABNORMAL HIGH (ref 4.0–10.5)

## 2010-12-29 LAB — URINALYSIS, ROUTINE W REFLEX MICROSCOPIC
Bilirubin Urine: NEGATIVE
Glucose, UA: NEGATIVE mg/dL
Ketones, ur: NEGATIVE mg/dL
Leukocytes, UA: NEGATIVE
Nitrite: NEGATIVE
Protein, ur: NEGATIVE mg/dL
Specific Gravity, Urine: 1.014 (ref 1.005–1.030)
Urobilinogen, UA: 1 mg/dL (ref 0.0–1.0)
pH: 6.5 (ref 5.0–8.0)

## 2010-12-29 LAB — POCT I-STAT 3, ART BLOOD GAS (G3+)
Acid-Base Excess: 1 mmol/L (ref 0.0–2.0)
Bicarbonate: 24.8 mEq/L — ABNORMAL HIGH (ref 20.0–24.0)
O2 Saturation: 97 %
Patient temperature: 98.6
TCO2: 26 mmol/L (ref 0–100)
pCO2 arterial: 35 mmHg (ref 35.0–45.0)
pH, Arterial: 7.457 — ABNORMAL HIGH (ref 7.350–7.400)
pO2, Arterial: 85 mmHg (ref 80.0–100.0)

## 2010-12-29 LAB — BASIC METABOLIC PANEL
BUN: 5 mg/dL — ABNORMAL LOW (ref 6–23)
CO2: 27 mEq/L (ref 19–32)
Calcium: 8.7 mg/dL (ref 8.4–10.5)
Chloride: 104 mEq/L (ref 96–112)
Creatinine, Ser: 0.6 mg/dL (ref 0.50–1.10)
GFR calc Af Amer: >90 mL/min (ref >90)
GFR calc non Af Amer: >90 mL/min (ref >90)
Glucose, Bld: 109 mg/dL — ABNORMAL HIGH (ref 70–99)
Potassium: 3 mEq/L — ABNORMAL LOW (ref 3.5–5.1)
Sodium: 141 mEq/L (ref 135–145)

## 2010-12-29 LAB — URINE MICROSCOPIC-ADD ON

## 2010-12-29 LAB — CREATININE, SERUM
Creatinine, Ser: 0.59 mg/dL (ref 0.50–1.10)
GFR calc Af Amer: >90 mL/min (ref >90)
GFR calc non Af Amer: >90 mL/min (ref >90)

## 2010-12-29 MED ORDER — CEFTRIAXONE SODIUM 2 G IJ SOLR
2.0000 g | Freq: Once | INTRAMUSCULAR | Status: AC
  Administered 2010-12-29: 2 g via INTRAVENOUS
  Filled 2010-12-29: qty 2

## 2010-12-29 MED ORDER — LORAZEPAM 1 MG PO TABS
ORAL_TABLET | ORAL | Status: AC
  Filled 2010-12-29: qty 1

## 2010-12-29 MED ORDER — MECLIZINE HCL 25 MG PO TABS
25.0000 mg | ORAL_TABLET | Freq: Three times a day (TID) | ORAL | Status: DC
  Administered 2010-12-29 – 2011-01-02 (×11): 25 mg via ORAL
  Filled 2010-12-29 (×13): qty 1

## 2010-12-29 MED ORDER — LORAZEPAM 2 MG/ML IJ SOLN
INTRAMUSCULAR | Status: AC
  Administered 2010-12-29: 1 mg via INTRAVENOUS
  Filled 2010-12-29: qty 1

## 2010-12-29 MED ORDER — LAMOTRIGINE 200 MG PO TABS
200.0000 mg | ORAL_TABLET | Freq: Once | ORAL | Status: AC
  Administered 2010-12-29: 200 mg via ORAL
  Filled 2010-12-29: qty 1

## 2010-12-29 MED ORDER — LORAZEPAM 2 MG/ML IJ SOLN: 1.0000 mg | Freq: Once | INTRAMUSCULAR | Status: DC

## 2010-12-29 MED ORDER — LORAZEPAM 2 MG/ML IJ SOLN: 2.0000 mg | Freq: Once | INTRAMUSCULAR | Status: DC

## 2010-12-29 MED ORDER — LORAZEPAM 2 MG/ML IJ SOLN
2.0000 mg | Freq: Once | INTRAMUSCULAR | Status: AC
  Administered 2010-12-29: 2 mg via INTRAVENOUS
  Filled 2010-12-29: qty 1

## 2010-12-29 MED ORDER — POTASSIUM CHLORIDE 10 MEQ/100ML IV SOLN
10.0000 meq | Freq: Once | INTRAVENOUS | Status: AC
  Administered 2010-12-29: 10 meq via INTRAVENOUS
  Filled 2010-12-29: qty 100

## 2010-12-29 MED ORDER — SODIUM CHLORIDE 0.9 % IV BOLUS (SEPSIS)
1000.0000 mL | Freq: Once | INTRAVENOUS | Status: AC
  Administered 2010-12-29: 1000 mL via INTRAVENOUS

## 2010-12-29 MED ORDER — VANCOMYCIN HCL IN DEXTROSE 1-5 GM/200ML-% IV SOLN
1000.0000 mg | Freq: Once | INTRAVENOUS | Status: AC
  Administered 2010-12-30: 1000 mg via INTRAVENOUS
  Filled 2010-12-29: qty 200

## 2010-12-29 MED ORDER — IOHEXOL 350 MG/ML SOLN
50.0000 mL | Freq: Once | INTRAVENOUS | Status: AC | PRN
  Administered 2010-12-29: 50 mL via INTRAVENOUS

## 2010-12-29 MED ORDER — DEXTROSE 5 % IV SOLN
2.0000 g | INTRAVENOUS | Status: DC
  Administered 2010-12-30: 2 g via INTRAVENOUS
  Filled 2010-12-29 (×2): qty 2

## 2010-12-29 MED ORDER — HEPARIN SODIUM (PORCINE) 5000 UNIT/ML IJ SOLN: 5000.0000 [IU] | Freq: Three times a day (TID) | INTRAMUSCULAR | Status: DC

## 2010-12-29 MED ORDER — ACETAMINOPHEN 325 MG PO TABS
650.0000 mg | ORAL_TABLET | Freq: Once | ORAL | Status: AC
  Administered 2010-12-29: 650 mg via ORAL
  Filled 2010-12-29: qty 2

## 2010-12-29 MED ORDER — LACOSAMIDE 50 MG PO TABS
100.0000 mg | ORAL_TABLET | Freq: Once | ORAL | Status: AC
  Administered 2010-12-29: 100 mg via ORAL
  Filled 2010-12-29: qty 2

## 2010-12-29 MED ORDER — MECLIZINE HCL 25 MG PO TABS
25.0000 mg | ORAL_TABLET | Freq: Once | ORAL | Status: AC
  Administered 2010-12-29: 25 mg via ORAL
  Filled 2010-12-29: qty 1

## 2010-12-29 MED ORDER — ONDANSETRON HCL 4 MG/2ML IJ SOLN
4.0000 mg | Freq: Once | INTRAMUSCULAR | Status: AC
  Administered 2010-12-29: 4 mg via INTRAVENOUS
  Filled 2010-12-29: qty 2

## 2010-12-29 NOTE — ED Notes (Signed)
Pt received from MRI.

## 2010-12-29 NOTE — ED Notes (Signed)
TSB was capped- paging [facility] now to [phone]

## 2010-12-29 NOTE — ED Notes (Signed)
Pt pulled her foley cath  out with bulb inflated. Seems confused.

## 2010-12-29 NOTE — ED Notes (Signed)
Patient transported to X-ray 

## 2010-12-29 NOTE — ED Notes (Signed)
Unassigned is TSB- paged to [phone]

## 2010-12-29 NOTE — ED Notes (Signed)
Pt here c/o dizziness and multiple seizures last night; pt with hx of epilepsy

## 2010-12-29 NOTE — ED Notes (Signed)
Patient transported to MRI 

## 2010-12-29 NOTE — ED Notes (Signed)
Pt recently tested positive for flu

## 2010-12-29 NOTE — H&P (Signed)
[name] is an 45 y.o. female.   Chief Complaint: dizziness and imbalance for 2 days HPI:  This 45 year old female with histroy of seizure, she was apparently in the ED 2 DAYS ago with cold symptoms and apparently diagnosed as flu and placed on [place] flu, but yesterday she felt dizzy and there is imbalance on her legs with jerky movement on both lower extremities,she became confused and accordingly brought to the hospital where patient found to be tachycardia , with WBC of 19K, Her husband witness seizure in the hospital. She is able to give very limited history, denies any chest pain or shortness of breath, denies any nausea or vomiting, denies any suicidal ideation , denies any neck pain or stiffness, no fever or chills. Past Medical History  Diagnosis Date  . Epilepsy   . Seizures   . Vertigo   . Constipation     Past Surgical History  Procedure Date  . Total hip arthroplasty   . Abdominal hysterectomy   . Colonoscopy     Family History  Problem Relation Age of Onset  . Cancer Mother   . Cancer Brother   . Diabetes Brother   . Cancer Maternal Aunt   . Diabetes Maternal Aunt    Social History: no smoking, no etoh abuse , lives with husband , has 2 grown up children Allergies:  Allergies  Allergen Reactions  . Ultram (Tramadol Hcl) Other (See Comments)    Reaction:Seizures  . Vicodin (Hydrocodone-Acetaminophen) Itching  . Latex Rash  . Penicillins Itching and Rash    Medications Prior to Admission  Medication Dose Route Frequency Provider Last Rate Last Dose  . acetaminophen (TYLENOL) tablet 650 mg  650 mg Oral Once Shaaron Adler, [place]   650 mg at 12/29/10 1527  . cefTRIAXone (ROCEPHIN) 2 g in dextrose 5 % 50 mL IVPB  2 g Intravenous Q24H Leocadio Heal I. Lisvet Rasheed      . lacosamide (VIMPAT) tablet 100 mg  100 mg Oral Once Shaaron Adler, PA   100 mg at 12/29/10 1340  . lamoTRIgine (LAMICTAL) tablet 200 mg  200 mg Oral Once Shaaron Adler, PA   200 mg  at 12/29/10 1236  . LORazepam (ATIVAN) injection 2 mg  2 mg Intravenous Once Shaaron Adler, PA   2 mg at 12/29/10 1526  . meclizine (ANTIVERT) tablet 25 mg  25 mg Oral Once [employer], [place]   25 mg at 12/29/10 1240  . meclizine (ANTIVERT) tablet 25 mg  25 mg Oral TID Khaleem Burchill I. Praise Dolecki      . ondansetron (ZOFRAN) injection 4 mg  4 mg Intravenous Once Shaaron Adler, PA   4 mg at 12/29/10 1057  . potassium chloride 10 mEq in 100 mL IVPB  10 mEq Intravenous Once Shaaron Adler, PA   10 mEq at 12/29/10 1528  . sodium chloride 0.9 % bolus 1,000 mL  1,000 mL Intravenous Once Shaaron Adler, PA   1,000 mL at 12/29/10 1058  . DISCONTD: heparin injection 5,000 Units  5,000 Units Subcutaneous Q8H Britany Callicott I. Dory Demont      . DISCONTD: LORazepam (ATIVAN) injection 1 mg  1 mg Intravenous Once Shaaron Adler, [place]      . DISCONTD: LORazepam (ATIVAN) injection 2 mg  2 mg Intravenous Once Shaaron Adler, [place]       Medications Prior to Admission  Medication Sig Dispense Refill  . clonazePAM (KLONOPIN) 0.5 MG tablet Take 0.5  mg by mouth at bedtime.        . Lacosamide (VIMPAT) 100 MG TABS Take 1 tablet by mouth 2 (two) times daily.        Marland Kitchen lamoTRIgine (LAMICTAL) 200 MG tablet Take 200 mg by mouth 2 (two) times daily.        Marland Kitchen oseltamivir (TAMIFLU) 75 MG capsule Take 75 mg by mouth every 12 (twelve) hours.        Marland Kitchen PARoxetine (PAXIL) 20 MG tablet Take 20 mg by mouth at bedtime.          Results for orders placed during the hospital encounter of 12/29/10 (from the past 48 hour(s))  CBC     Status: Abnormal   Collection Time   12/29/10 12:29 PM      Component Value Range Comment   WBC 19.8 (*) 4.0 - 10.5 (K/uL)    RBC 3.74 (*) 3.87 - 5.11 (MIL/uL)    Hemoglobin 11.4 (*) 12.0 - 15.0 (g/dL)    HCT 24.4 (*) 01.0 - 46.0 (%)    MCV 89.0  78.0 - 100.0 (fL)    MCH 30.5  26.0 - 34.0 (pg)    MCHC 34.2  30.0 - 36.0 (g/dL)    RDW 27.2  53.6 - 64.4 (%)     Platelets 219  150 - 400 (K/uL)   BASIC METABOLIC PANEL     Status: Abnormal   Collection Time   12/29/10 12:29 PM      Component Value Range Comment   Sodium 141  135 - 145 (mEq/L)    Potassium 3.0 (*) 3.5 - 5.1 (mEq/L)    Chloride 104  96 - 112 (mEq/L)    CO2 27  19 - 32 (mEq/L)    Glucose, Bld 109 (*) 70 - 99 (mg/dL)    BUN 5 (*) 6 - 23 (mg/dL)    Creatinine, Ser 0.34  0.50 - 1.10 (mg/dL)    Calcium 8.7  8.4 - 10.5 (mg/dL)    GFR calc non Af Amer >90  >90 (mL/min)    GFR calc Af Amer >90  >90 (mL/min)   URINALYSIS, ROUTINE W REFLEX MICROSCOPIC     Status: Abnormal   Collection Time   12/29/10  3:22 PM      Component Value Range Comment   Color, Urine YELLOW  YELLOW     APPearance CLEAR  CLEAR     Specific Gravity, Urine 1.014  1.005 - 1.030     pH 6.5  5.0 - 8.0     Glucose, UA NEGATIVE  NEGATIVE (mg/dL)    Hgb urine dipstick TRACE (*) NEGATIVE     Bilirubin Urine NEGATIVE  NEGATIVE     Ketones, ur NEGATIVE  NEGATIVE (mg/dL)    Protein, ur NEGATIVE  NEGATIVE (mg/dL)    Urobilinogen, UA 1.0  0.0 - 1.0 (mg/dL)    Nitrite NEGATIVE  NEGATIVE     Leukocytes, UA NEGATIVE  NEGATIVE    URINE MICROSCOPIC-ADD ON     Status: Normal   Collection Time   12/29/10  3:22 PM      Component Value Range Comment   Squamous Epithelial / LPF RARE  RARE     RBC / HPF 0-2  <3 (RBC/hpf)   CBC     Status: Abnormal   Collection Time   12/29/10  5:58 PM      Component Value Range Comment   WBC 18.5 (*) 4.0 - 10.5 (K/uL)    RBC 3.55 (*)  3.87 - 5.11 (MIL/uL)    Hemoglobin 10.7 (*) 12.0 - 15.0 (g/dL)    HCT 40.9 (*) 81.1 - 46.0 (%)    MCV 88.2  78.0 - 100.0 (fL)    MCH 30.1  26.0 - 34.0 (pg)    MCHC 34.2  30.0 - 36.0 (g/dL)    RDW 91.4  78.2 - 95.6 (%)    Platelets 210  150 - 400 (K/uL)   DIFFERENTIAL     Status: Abnormal   Collection Time   12/29/10  5:58 PM      Component Value Range Comment   Neutrophils Relative 81 (*) 43 - 77 (%)    Neutro Abs 14.9 (*) 1.7 - 7.7 (K/uL)    Lymphocytes  Relative 12  12 - 46 (%)    Lymphs Abs 2.1  0.7 - 4.0 (K/uL)    Monocytes Relative 8  3 - 12 (%)    Monocytes Absolute 1.4 (*) 0.1 - 1.0 (K/uL)    Eosinophils Relative 0  0 - 5 (%)    Eosinophils Absolute 0.0  0.0 - 0.7 (K/uL)    Basophils Relative 0  0 - 1 (%)    Basophils Absolute 0.0  0.0 - 0.1 (K/uL)   CREATININE, SERUM     Status: Normal   Collection Time   12/29/10  6:00 PM      Component Value Range Comment   Creatinine, Ser 0.59  0.50 - 1.10 (mg/dL)    GFR calc non Af Amer >90  >90 (mL/min)    GFR calc Af Amer >90  >90 (mL/min)    Dg Chest 2 View  12/29/2010  *RADIOLOGY REPORT*  Clinical Data: Dizziness and weakness  CHEST - 2 VIEW  Comparison: 12/26/2010  Findings: Heart size is normal.  No pleural effusion or pulmonary edema.  No airspace consolidation identified.   Review of the visualized osseous structures is negative.  IMPRESSION:  1.  No acute cardiopulmonary abnormalities.  Original Report Authenticated By: Rosealee Albee, M.D.   Mr Brain Wo Contrast  12/29/2010  *RADIOLOGY REPORT*  Clinical Data: Dizziness with unsteady gait.  Questionable seizures.  MRI HEAD WITHOUT CONTRAST  Technique:  Multiplanar, multiecho pulse sequences of the brain and surrounding structures were obtained according to standard protocol without intravenous contrast.  Comparison: 08/22/2010 MR and CT.  Findings: Motion degraded exam.  No acute infarct.  No intracranial hemorrhage.  Mild global atrophy without hydrocephalus.  No intracranial mass lesion or seizure focus detected on this unenhanced motion degraded exam.  Major intracranial vascular structures appear grossly patent.  Minimal paranasal sinus mucosal thickening.  IMPRESSION: Motion degraded exam without acute abnormality.  Please see above.  Original Report Authenticated By: Fuller Canada, M.D.    Review of Systems  Unable to perform ROS: medical condition    Blood pressure 100/84, pulse 108, temperature 99 F (37.2 C), temperature  source Oral, resp. rate 18, weight 77.111 kg (170 lb), SpO2 95.00%. Physical Exam  She seems with some confusion and some mild respiratory distress Pupil equally reactive to light and accomodation Neck supple , no neck stiffness Cranial nerves all seems intact but noticed some dysarthria Mucosa moist, no oral thrush Heart :s1 and s2 ,no addeed murmurs Lung normal breathing , no rales , no wheezing Abdomin soft and non tender ,BS presents Extremities no edema CNS: SOMEconfusion and jerky movement, has positive clonus on both lower extremities , although power intact on both upper and lower extremities,reflexeswith hyperreflexia on kneesBabinski  absent both ,she has ataxia and dysmetria and positive finger to nose test  Assessment/Plan  58 female with history of seizure , presented with  AMS,ataxia and increase jerky movement, MRI motion degraded, will ask neuro to see, actually [facility] neurologist and recommend stat ct angio ,aslo will proceed with IR  For LP,  wbc and differential, protein and glucose, Tylenol level, aspirin level drug screen, will cover with Rocephin iv and vancomycin for possible meningitis, herpes PCR. 2- tachy cardia:likely underlying infection and dehydration,septic work up and procalcitonin level 3- leukocytosis: differential mainly neutrophils , will cover as if bacterial meningitis , will admit to stepdown for now 4- hypokalemia  replacement  Markian Glockner I. 12/29/2010, 7:07 PM

## 2010-12-29 NOTE — Progress Notes (Signed)
2:49 PM  Date: 12/29/2010  Rate: 106  Rhythm: sinus tachycardia  QRS Axis: normal  Intervals: normal  ST/T Wave abnormalities: Minimal ST deppression and T wave inversion in inferior and lateral leads.  Conduction Disutrbances:none  Narrative Interpretation: Borderline EKG.  Old EKG Reviewed: unchanged

## 2010-12-29 NOTE — ED Provider Notes (Cosign Needed)
12:25 PM Patient is a 45 year old woman with a history of history of seizure disorder, who developed dizziness about 10 PM last night was no precipitating event. She had one prior episode of dizziness in the past she says that when she sits up, or tries to stand she is feeling of everything spinning. She's had some double vision. Exam shows her to be a middle-aged woman in no particular distress at rest. Extraocular movements show nystagmus on lateral gaze to the right tympanic membrane is normal oropharynx is clear neck is supple no adenopathy no bruits chest is clear heart sounds are normal abdomen soft no mass or tenderness skin clear neurologic exam shows her awake and alert sensory and motor intact.  We will do her laboratory workup an MRI to check for posterior stroke. We will prescribed her Antivert 25 mg orally now, and also give her seizure medications, and Vimpat and Lamictal.  3:30 P.M. Lab workup did not show a posterior fossa lesion.  Lab workup essentially negative.  She had abnormal behavior, particularly in MRI, where she thrashed around and pulled out her IV.  She will need admission for observation for altered mental status.  LP was not performed on this patient because she did not have fever, stiff neck, and was too agitated to cooperate with the exam.    Carleene Cooper III, MD 12/29/10 [phone]

## 2010-12-29 NOTE — ED Notes (Signed)
RETURN FROM MRI. UNABLE TO PERFORM DUE TO PT THRASHING AROUND IN MACHINE AND HAVING SEIZURE LIKE ACTIVITY. MRI TECH STS PT BANGING HEAD AGAINST MACHINE. NO loc OR POST-ICTAL PERIOD NOTED.

## 2010-12-29 NOTE — ED Notes (Signed)
After receiving fluids pt sts feeling a little better. When pt ambulated she was very unsteady on feet. sts now she is seeing double when looking at the TV. MD aware.

## 2010-12-29 NOTE — Consult Note (Addendum)
NEUROLOGY GENERAL CONSULT NOTE  Referring Physician:  Hind Elsaid, MD  HPI:  [name] is a 45 y.o. female with history of epilepsy who presents with 2 days of lightheadedness and confusion. Patient is a poor historian at this time but reports she started feeling lightheaded 2 days ago and has fallen a few times as a result. She was sent to the ED today by her husband for odd behavior and possible confusion. While in the ED, she began having generalized jerking movements without loss of consciousness and was given ativan due to concern for seizure. These movements have since resolved. MRI brain without contrast was performed and was unremarkable, though it is significantly motion degraded due to her jerking movements. Patient reports her seizures typically involved generalized jerking with loss of consciousness or staring spells and are preceeded by a "funny feeling". She says she has not had a seizure in several days. Of note, she was recently in the ED on 12/13 with flu-like symptoms and was treated and released at that time. She reports compliance with her Lamictal and Vimpat and denies taking any new medication or illicit substance.  Past Medical History  Diagnosis Date  . Epilepsy   . Seizures   . Vertigo   . Constipation     Past Surgical History  Procedure Date  . Total hip arthroplasty   . Abdominal hysterectomy   . Colonoscopy    Allergies  Allergen Reactions  . Ultram (Tramadol Hcl) Other (See Comments)    Reaction:Seizures  . Vicodin (Hydrocodone-Acetaminophen) Itching  . Latex Rash  . Penicillins Itching and Rash    (Not in a hospital admission) Family History  Problem Relation Age of Onset  . Cancer Mother   . Cancer Brother   . Diabetes Brother   . Cancer Maternal Aunt   . Diabetes Maternal Aunt    Social: Unable to obtain from patient due to mental status.  Review of Systems:   A ten system review of systems was obtained and was negative except as  stated above.   Physical Exam: BP 100/84  Pulse 108  Temp(Src) 99 F (37.2 C) (Oral)  Resp 18  Wt 77.111 kg (170 lb)  SpO2 95% GENERAL:   Well nourished, well hydrated, no acute distress.   ENT:       Mouth:  Good dentition.      Throat:  Oropharynx clear. No lymphadenopathy.  CARDIOVASCULAR:   Regular rate and rhythm, no thrills or palpable murmurs, S1, S2, no murmur, no rubs or gallops.      Carotid arteries: No carotid bruits.   RESPIRATORY:  Clear to auscultation bilaterally, no wheezes, rhonci or rales  ABDOMEN:   Soft, non-tender, non-distended, bowel sounds present, no rebound or guarding  EXTREMITIES:  No rashes or lesions     No peripheral edema, cyanosis, or clubbing   MENTAL STATUS EXAM:    Orientation:  Alert and oriented to person and place. Off on date.      Memory:  Cooperative, follows commands well.  Recent and remote memory mildly impaired.     Attention, concentration:  Attention span and concentration are normal.      Language:  Speech is dysarthric and language is normal.      CRANIAL NERVES:     CN 2 (Optic):  Visual fields intact to confrontation, funduscopic examination without optic disk pallor or edema, retinal vessels are normal.      CN 3,4,6 (EOM):  Pupils equal and  reactive to light and near full eye movement with horizontal end-gaze nystagmus bilaterally.      CN 5 (Trigeminal):  Facial sensation is normal, no weakness of masticatory muscles.      CN 7 (Facial):  No facial weakness or asymmetry.      CN 8 (Auditory):  Auditory acuity grossly normal.      CN 9,10 (Glossophar):  The uvula is midline, the palate elevates symmetrically.      CN 11 (spinal access):  Normal sternocleidomastoid and trapezius strength.      CN 12 (Hypoglossal):  The tongue is midline. No atrophy or fasciculations.   MOTOR:    Deltoids:            (R): 5  (L): 5      Biceps:                       (R): 5  (L): 5      Triceps:                       (R): 5  (L): 5           Wrist Extensors:         (R): 5  (L): 5      Wrist Flexors:      (R): 5  (L): 5      Flexor Pollicis Longus:      (R): 5  (L): 5      Adductor Digiti Minimi:       (R): 5  (L): 5      Abductor Pollicis Brevis:   (R): 5  (L): 5      Hip Flexors:                       (R): 5  (L): 5      Quadriceps:                       (R): 5  (L): 5      Hamstrings:                       (R): 5  (L): 5      Tibialis Anterior:                 (R): 5  (L): 5      Medial Gastrocnemius:     (R): 5  (L): 5  Muscle Tone: Increased tone throughout, especially in the lower extremities.  REFLEXES:     Triceps:                 (R): 3+  (L): 3+      Biceps:                  (R): 3+  (L): 3+      Brachioradialis:     (R): 3+  (L): 3+      Patellar:                 (R): 3+  (L): 3+      Achilles:                 (R): 4+ with sustained clonus  (L): 4+ with sustained clonus      Hoffman:    (R): briskly present  (L): briskly present      Babinski:    (  R): absent  (L): absent      Prominent jaw jerk.  COORDINATION:     Bilateral ataxia with FNF and HTS, worse on left than right.  SENSATION:     Intact to light touch.   GAIT:     Deferred due to seizure risk.  Diagnostic Studies:   1) MRI brain without contrast Findings: Motion degraded exam. No acute infarct. No intracranial hemorrhage. Mild global atrophy without hydrocephalus. No intracranial mass lesion or seizure focus detected on this unenhanced motion degraded exam. Major intracranial vascular structures appear grossly patent. Minimal paranasal sinus mucosal thickening.  IMPRESSION: Motion degraded exam without acute abnormality.  Assessment:  45y/o [employer] with history of epilepsy on Lamictal and Vimpat presenting with a 2 day history of lightheadness with falls and now with AMS. Exam is significant for marked generalized hyperreflexia with increased tone and ataxia, all suggestive of an upper motor neuron and CNS abnormality. Given her recent flu-like  symptoms, would be concerned for a viral encephalitis. A basilar occlusion is less likely but should be ruled out given her symptoms.  Plan: -Recommend STAT CTA head/neck to rule out basilar syndrome (this has been ordered). -After CTA, would recommend LP with cell counts x2, protein, glucose, bacterial cx, gram stain, and HSV/VZV PCRs. Would ask for excessive fluid to be frozen for further study. -Would treat empirically with IV acyclovir pending PCR return. -EEG tomorrow. -Would continue her Lamictal and Vimpat at home doses for now.  Thank you for this consultation. The neurology consult team will follow up tomorrow. Please page me with any further questions if needed.  Kipp Laurence, MD [facility] Pager [phone] 12/29/10 18:52

## 2010-12-29 NOTE — ED Provider Notes (Signed)
History     CSN: [id number] Arrival date & time: 12/29/2010  8:44 AM   First MD Initiated Contact with Patient 12/29/10 0914      Chief Complaint  Patient presents with  . Seizures  . Dizziness    (Consider location/radiation/quality/duration/timing/severity/associated sxs/prior treatment) The history is provided by the patient and the spouse.  The is a 45 year old female who presents with a chief complaint of dizziness. It was acute in onset around 10 PM last night and has been fairly constant since that time. It is described as a sensation of the room spinning is worse when sitting or standing better when lying down. Seated nausea but no vomiting and there is no associated headache, change in vision or blurry vision, or tinnitus. There is no shortness of breath, chest pain, abdominal pain. There and there has been no prior treatment.  The patient husband also reports that she had multiple seizures overnight. She does have a history of seizure disorder. The seizures are typical for her and she is reported to be at her baseline mental status at the time of examination.  Of note, the patient was seen and evaluated in the emergency department on 12/26/2010 with a complaint of seizure. On that day, she was seen by her primary Dr. was diagnosed with influenza via a PCR test. She reports continued myalgias, congestion, sore throat, cough, low-grade fever and her husband reports that she has not been staying well hydrated. Has been taking Tamiflu without change in symptoms.  Past Medical History  Diagnosis Date  . Epilepsy   . Seizures   . Vertigo   . Constipation     Past Surgical History  Procedure Date  . Total hip arthroplasty   . Abdominal hysterectomy   . Colonoscopy     Family History  Problem Relation Age of Onset  . Cancer Mother   . Cancer Brother   . Diabetes Brother   . Cancer Maternal Aunt   . Diabetes Maternal Aunt     History  Substance Use Topics  . Smoking  status: Never Smoker   . Smokeless tobacco: Not on file  . Alcohol Use: No     Review of Systems  Constitutional: Positive for fever, chills and fatigue.  HENT: Positive for congestion, sore throat, rhinorrhea and sinus pressure. Negative for hearing loss, ear pain, nosebleeds, drooling, trouble swallowing, neck pain and neck stiffness.   Eyes: Negative for pain and visual disturbance.  Respiratory: Positive for cough. Negative for chest tightness and shortness of breath.   Cardiovascular: Negative for chest pain, palpitations and leg swelling.  Gastrointestinal: Positive for nausea. Negative for vomiting, abdominal pain, diarrhea and constipation.  Genitourinary: Negative for dysuria, hematuria and flank pain.  Musculoskeletal: Negative for back pain, joint swelling and gait problem.  Skin: Negative for rash and wound.  Neurological: Positive for dizziness, seizures, weakness and headaches. Negative for syncope, facial asymmetry, speech difficulty and numbness.  Psychiatric/Behavioral: Negative for behavioral problems and confusion.    Allergies  Ultram; Vicodin; Latex; and Penicillins  Home Medications   Current Outpatient Rx  Name Route Sig Dispense Refill  . CLONAZEPAM 0.5 MG PO TABS Oral Take 0.5 mg by mouth at bedtime.      Marland Kitchen LACOSAMIDE 100 MG PO TABS Oral Take 1 tablet by mouth 2 (two) times daily.      Marland Kitchen LAMOTRIGINE 200 MG PO TABS Oral Take 200 mg by mouth 2 (two) times daily.      . OSELTAMIVIR PHOSPHATE  75 MG PO CAPS Oral Take 75 mg by mouth every 12 (twelve) hours.      Marland Kitchen PAROXETINE HCL 20 MG PO TABS Oral Take 20 mg by mouth at bedtime.        BP 116/89  Pulse 118  Temp(Src) 100.1 F (37.8 C) (Oral)  Wt 170 lb (77.111 kg)  SpO2 94%  Physical Exam  Nursing note and vitals reviewed. Constitutional: She is oriented to person, place, and time. She appears well-developed and well-nourished. She appears distressed.  HENT:  Head: Normocephalic and atraumatic.  Right  Ear: External ear normal.  Left Ear: External ear normal.       Pharynx erythematous without edema or exudate. Bilateral TM without erythema, bulging, or effusion.  Eyes: Conjunctivae are normal. Pupils are equal, round, and reactive to light. Right eye exhibits no discharge. Left eye exhibits no discharge. No scleral icterus.       Horizontal nystagmus on lateral right gaze. Visual fields full to confrontation bilaterally.  Neck: Normal range of motion. Neck supple. No JVD present.       No meningismus  Cardiovascular: Regular rhythm, normal heart sounds and intact distal pulses.   No murmur heard.      tachycardia  Pulmonary/Chest: Effort normal and breath sounds normal. No respiratory distress. She has no wheezes. She exhibits no tenderness.  Abdominal: Soft. Bowel sounds are normal. She exhibits no distension. There is no tenderness. There is no guarding.  Musculoskeletal: Normal range of motion. She exhibits no edema and no tenderness.  Lymphadenopathy:    She has no cervical adenopathy.  Neurological: She is alert and oriented to person, place, and time. No cranial nerve deficit.       F-N with shaky movement but end-point appropriate bilaterally. H-S intact bilaterally. Sensation intact to light touch. Strength tested and is 5/5 in all extremities. Gait unsteady with slow/erratic steps.    ED Course  Procedures (including critical care time)  Labs Reviewed  CBC - Abnormal; Notable for the following:    WBC 19.8 (*)    RBC 3.74 (*)    Hemoglobin 11.4 (*)    HCT 33.3 (*)    All other components within normal limits  BASIC METABOLIC PANEL - Abnormal; Notable for the following:    Potassium 3.0 (*)    Glucose, Bld 109 (*)    BUN 5 (*)    All other components within normal limits  I-STAT TROPONIN I  URINALYSIS, ROUTINE W REFLEX MICROSCOPIC   Dg Chest 2 View  12/29/2010  *RADIOLOGY REPORT*  Clinical Data: Dizziness and weakness  CHEST - 2 VIEW  Comparison: 12/26/2010   Findings: Heart size is normal.  No pleural effusion or pulmonary edema.  No airspace consolidation identified.   Review of the visualized osseous structures is negative.  IMPRESSION:  1.  No acute cardiopulmonary abnormalities.  Original Report Authenticated By: Rosealee Albee, M.D.      Dx 1: dizziness Dx 2: altered mental status  MDM  10:30 AM The patient has dizziness with some unsteadiness on ambulation. I suspect that there is dehydration contributed to her symptoms so we'll give her a bolus of fluid and reassess.   12:00 PM After IV fluid administration, the patient reports a subjective improvement in her symptoms. However, her gait is still very unsteady and she now complains of "double vision." We will perform further testing to evaluate her for an acute stroke, as well as determination of her cardiac status and evaluation for  any infection.   2:12 PM Pt labs are reviewed and show leukocytosis, mild anemia, hypokalemia.   3:25 PM Pt was unable to tolerate MRI. Has become more agitated and restless throughout visit. Will call for admission for further evaluation of dizziness/altered mental status   3:43 PM Have spoken with Dr Eda Paschal regarding admission, bed request has been made. Pt Is responding appropriately to questions at this time. Still with jerky movements bilaterally. Will re-attempt MRI with ativan on board.  Medical screening examination/treatment/procedure(s) were conducted as a shared visit with non-physician practitioner(s) and myself.  I personally evaluated the patient during the encounter Pt with complaint of dizziness, exhibited increasingly abnormal behavior during her visit.  Will need admission for observation for altered level of consciousness. Osvaldo Human, M.D.    [address] [place], [place] 12/29/10 1545  Carleene Cooper III, MD 12/29/10 [phone]

## 2010-12-29 NOTE — ED Notes (Signed)
Charge RN on tele floor notified that pt is intermittently confused. Bed assignment revised to room with camera for pt safety.

## 2010-12-29 NOTE — ED Notes (Signed)
MD at bedside. 

## 2010-12-29 NOTE — Progress Notes (Signed)
ANTIBIOTIC CONSULT NOTE - INITIAL  Pharmacy Consult for Vancomycin  Indication: R/O meningitis  Allergies  Allergen Reactions  . Ultram (Tramadol Hcl) Other (See Comments)    Reaction:Seizures  . Vicodin (Hydrocodone-Acetaminophen) Itching  . Latex Rash  . Penicillins Itching and Rash    Patient Measurements: Weight: 170 lb (77.111 kg)   Vital Signs: Temp: 99 F (37.2 C) (12/16 1737) Temp src: Oral (12/16 1440) BP: 100/84 mmHg (12/16 1737) Pulse Rate: 108  (12/16 1737) Intake/Output from previous day:   Intake/Output from this shift:    Labs:  Basename 12/29/10 1800 12/29/10 1758 12/29/10 1229  WBC -- 18.5* 19.8*  HGB -- 10.7* 11.4*  PLT -- 210 219  LABCREA -- -- --  CREATININE 0.59 -- 0.60    Microbiology: Recent Results (from the past 720 hour(s))  URINE CULTURE     Status: Normal   Collection Time   12/26/10  7:18 PM      Component Value Range Status Comment   Specimen Description URINE, CLEAN CATCH   Final    Special Requests NONE   Final    Setup Time [id number]   Final    Colony Count >=100,000 COLONIES/ML   Final    Culture     Final    Value: Multiple bacterial morphotypes present, none predominant. Suggest appropriate recollection if clinically indicated.   Report Status 12/27/2010 FINAL   Final     Medical History: Past Medical History  Diagnosis Date  . Epilepsy   . Seizures   . Vertigo   . Constipation      Assessment: Pt admitted for increased confusion PTA and new jerking like movements in ED.  She was recently treated for flu-like symptoms. MRI negative treat for meningitis  Goal of Therapy:  Vancomycin trough level 15-20 mcg/ml  Plan:  Vancomycin 1GM IV q8hr  Marcelino Scot 12/29/2010,7:24 PM

## 2010-12-30 ENCOUNTER — Encounter (HOSPITAL_COMMUNITY): Payer: Self-pay | Admitting: *Deleted

## 2010-12-30 ENCOUNTER — Inpatient Hospital Stay (HOSPITAL_COMMUNITY): Payer: Medicare Other

## 2010-12-30 ENCOUNTER — Other Ambulatory Visit (HOSPITAL_COMMUNITY): Payer: Medicare Other

## 2010-12-30 ENCOUNTER — Emergency Department (HOSPITAL_COMMUNITY): Payer: Medicare Other

## 2010-12-30 LAB — CARDIAC PANEL(CRET KIN+CKTOT+MB+TROPI)
CK, MB: 3.5 ng/mL (ref 0.3–4.0)
CK, MB: 3.5 ng/mL (ref 0.3–4.0)
CK, MB: 3.3 ng/mL (ref 0.3–4.0)
Relative Index: 1 (ref 0.0–2.5)
Relative Index: 1 (ref 0.0–2.5)
Relative Index: 1.1 (ref 0.0–2.5)
Total CK: 363 U/L — ABNORMAL HIGH (ref 7–177)
Total CK: 341 U/L — ABNORMAL HIGH (ref 7–177)
Total CK: 295 U/L — ABNORMAL HIGH (ref 7–177)
Troponin I: <0.3 ng/mL (ref <0.30)
Troponin I: <0.3 ng/mL (ref <0.30)
Troponin I: <0.3 ng/mL (ref <0.30)

## 2010-12-30 LAB — HSV(HERPES SIMPLEX VRS) I + II AB-IGG
HSV 1 Glycoprotein G Ab, IgG: 9.6 IV — ABNORMAL HIGH
HSV 2 Glycoprotein G Ab, IgG: <0.1 IV

## 2010-12-30 LAB — PROTEIN AND GLUCOSE, CSF
Glucose, CSF: 56 mg/dL (ref 43–76)
Total  Protein, CSF: 23 mg/dL (ref 15–45)

## 2010-12-30 LAB — CSF CELL COUNT WITH DIFFERENTIAL
RBC Count, CSF: 96 /mm3 — ABNORMAL HIGH
Tube #: 3
WBC, CSF: 1 /mm3 (ref 0–5)

## 2010-12-30 LAB — MRSA PCR SCREENING: MRSA by PCR: NEGATIVE

## 2010-12-30 LAB — PROCALCITONIN: Procalcitonin: <0.1 ng/mL

## 2010-12-30 MED ORDER — PAROXETINE HCL 20 MG PO TABS
20.0000 mg | ORAL_TABLET | Freq: Every day | ORAL | Status: DC
  Administered 2010-12-30 – 2011-01-01 (×3): 20 mg via ORAL
  Filled 2010-12-30 (×4): qty 1

## 2010-12-30 MED ORDER — VANCOMYCIN HCL IN DEXTROSE 1-5 GM/200ML-% IV SOLN: 1000.0000 mg | Freq: Three times a day (TID) | INTRAVENOUS | Status: DC

## 2010-12-30 MED ORDER — IBUPROFEN 400 MG PO TABS
400.0000 mg | ORAL_TABLET | Freq: Three times a day (TID) | ORAL | Status: DC | PRN
  Administered 2010-12-30: 400 mg via ORAL
  Filled 2010-12-30 (×3): qty 1

## 2010-12-30 MED ORDER — MOXIFLOXACIN HCL IN NACL 400 MG/250ML IV SOLN
400.0000 mg | INTRAVENOUS | Status: DC
  Administered 2010-12-30 – 2011-01-01 (×3): 400 mg via INTRAVENOUS
  Filled 2010-12-30 (×5): qty 250

## 2010-12-30 MED ORDER — ACETAMINOPHEN 325 MG PO TABS
650.0000 mg | ORAL_TABLET | ORAL | Status: DC | PRN
  Administered 2010-12-31 (×2): 650 mg via ORAL
  Filled 2010-12-30 (×3): qty 2

## 2010-12-30 MED ORDER — VANCOMYCIN HCL IN DEXTROSE 1-5 GM/200ML-% IV SOLN
1000.0000 mg | Freq: Three times a day (TID) | INTRAVENOUS | Status: DC
  Administered 2010-12-30 – 2010-12-31 (×5): 1000 mg via INTRAVENOUS
  Filled 2010-12-30 (×6): qty 200

## 2010-12-30 MED ORDER — LAMOTRIGINE 200 MG PO TABS
200.0000 mg | ORAL_TABLET | ORAL | Status: AC
  Filled 2010-12-30: qty 1

## 2010-12-30 MED ORDER — LACOSAMIDE 100 MG PO TABS
1.0000 | ORAL_TABLET | Freq: Two times a day (BID) | ORAL | Status: DC
  Administered 2010-12-30: 100 mg via ORAL

## 2010-12-30 MED ORDER — LACOSAMIDE 50 MG PO TABS
100.0000 mg | ORAL_TABLET | ORAL | Status: AC
  Filled 2010-12-30: qty 2

## 2010-12-30 MED ORDER — LACOSAMIDE 200 MG PO TABS
100.0000 mg | ORAL_TABLET | Freq: Two times a day (BID) | ORAL | Status: DC
Start: 2010-12-30 — End: 2011-01-02
  Administered 2010-12-30 – 2011-01-02 (×7): 100 mg via ORAL
  Filled 2010-12-30: qty 2
  Filled 2010-12-30: qty 1
  Filled 2010-12-30 (×2): qty 2
  Filled 2010-12-30: qty 4
  Filled 2010-12-30 (×2): qty 2

## 2010-12-30 MED ORDER — IBUPROFEN 600 MG PO TABS
600.0000 mg | ORAL_TABLET | Freq: Three times a day (TID) | ORAL | Status: DC | PRN
  Administered 2010-12-31 – 2011-01-01 (×2): 600 mg via ORAL
  Filled 2010-12-30 (×5): qty 1

## 2010-12-30 MED ORDER — PAROXETINE HCL 20 MG PO TABS
20.0000 mg | ORAL_TABLET | ORAL | Status: AC
  Administered 2010-12-30: 20 mg via ORAL
  Filled 2010-12-30: qty 1

## 2010-12-30 MED ORDER — SODIUM CHLORIDE 0.9 % IV SOLN
INTRAVENOUS | Status: DC
  Administered 2011-01-01 (×2): via INTRAVENOUS

## 2010-12-30 MED ORDER — LAMOTRIGINE 200 MG PO TABS
200.0000 mg | ORAL_TABLET | Freq: Two times a day (BID) | ORAL | Status: DC
  Administered 2010-12-30 – 2011-01-02 (×8): 200 mg via ORAL
  Filled 2010-12-30 (×8): qty 1

## 2010-12-30 MED ORDER — OSELTAMIVIR PHOSPHATE 75 MG PO CAPS
75.0000 mg | ORAL_CAPSULE | ORAL | Status: AC
  Administered 2010-12-30: 75 mg via ORAL
  Filled 2010-12-30: qty 1

## 2010-12-30 MED ORDER — IBUPROFEN 600 MG PO TABS
600.0000 mg | ORAL_TABLET | Freq: Once | ORAL | Status: AC
  Administered 2010-12-30: 600 mg via ORAL
  Filled 2010-12-30: qty 1

## 2010-12-30 MED ORDER — OSELTAMIVIR PHOSPHATE 75 MG PO CAPS: 75.0000 mg | ORAL_CAPSULE | Freq: Two times a day (BID) | ORAL | Status: DC

## 2010-12-30 MED ORDER — OSELTAMIVIR PHOSPHATE 75 MG PO CAPS
75.0000 mg | ORAL_CAPSULE | Freq: Two times a day (BID) | ORAL | Status: DC
  Administered 2010-12-30 – 2011-01-02 (×6): 75 mg via ORAL
  Filled 2010-12-30 (×7): qty 1

## 2010-12-30 MED ORDER — POTASSIUM CHLORIDE CRYS ER 20 MEQ PO TBCR
20.0000 meq | EXTENDED_RELEASE_TABLET | Freq: Once | ORAL | Status: AC
  Administered 2010-12-30: 20 meq via ORAL
  Filled 2010-12-30: qty 1

## 2010-12-30 NOTE — ED Notes (Signed)
Patient resting in room at this time.  Seizure pads on bed. Pt NSR on monitor.  Pt states that she is feeling better today.  Pt offered coke at this time.  Pt denies any pain.  PERRL.  Pt is alert and oriented.  Family at pt bedside.

## 2010-12-30 NOTE — H&P (Signed)
Assisted patient to the bathroom to void. Jerky movements noted in legs. Gait unsteady. Patient complained of dizziness and weakness. Will continue to monitor. Will keep patient on bedrest for the time being.

## 2010-12-30 NOTE — ED Notes (Signed)
Patient has returned from radiology. Family at bedside.

## 2010-12-30 NOTE — ED Notes (Signed)
Patient transported to X-ray for lumbar puncture  

## 2010-12-30 NOTE — Progress Notes (Signed)
Subjective: Patient presented with confusion on yesterday.  Antibiotics have been started.  Patient improved today.  LP and cultures performed and pending. CTA of head and neck show no hemodynamically significant stenosis.  MRI shows no acute changes.    Objective: Vital signs in last 24 hours: Temp:  [97.5 F (36.4 C)-99 F (37.2 C)] 98 F (36.7 C) (12/17 1003) Pulse Rate:  [76-125] 99  (12/17 1003) Resp:  [16-32] 22  (12/17 1003) BP: (89-145)/(58-128) 101/66 mmHg (12/17 0830) SpO2:  [89 %-98 %] 96 % (12/17 1003)  Intake/Output from previous day: 12/16 0701 - 12/17 0700 In: -  Out: 1500 [Urine:1500] Intake/Output this shift:   Nutritional status: Cardiac  Neurologic Exam: Mental Status: Alert, oriented, thought content appropriate.  Speech fluent without evidence of aphasia but with a mild dysarthria.  Able to follow 3 step commands without difficulty. Cranial Nerves: II: visual fields grossly normal, pupils equal, round, reactive to light and accommodation III,IV, VI: ptosis not present, extra-ocular motions intact bilaterally V,VII: smile symmetric, facial light touch sensation normal bilaterally VIII: hearing normal bilaterally IX,X: gag reflex present XI: trapezius strength/neck flexion strength normal bilaterally XII: tongue strength normal  Motor: Right : Upper extremity   5/5    Left:     Upper extremity   5/5  Lower extremity   5/5     Lower extremity   5/5 Tone and bulk:normal tone throughout; no atrophy noted Sensory: Pinprick and light touch intact throughout, bilaterally Deep Tendon Reflexes: 3+ and symmetric throughout Plantars: Right: downgoing   Left: downgoing Cerebellar: normal finger-to-nose and normal heel-to-shin test (mild shakiness seen with LLE)   Lab Results:  Basename 12/29/10 1800 12/29/10 1758 12/29/10 1229  WBC -- 18.5* 19.8*  HGB -- 10.7* 11.4*  HCT -- 31.3* 33.3*  PLT -- 210 219  NA -- -- 141  K -- -- 3.0*  CL -- -- 104  CO2 -- --  27  GLUCOSE -- -- 109*  BUN -- -- 5*  CREATININE 0.59 -- 0.60  CALCIUM -- -- 8.7  LABA1C -- -- --  CK - 363  Studies/Results: Ct Angio Head W/cm &/or Wo Cm  12/29/2010  *RADIOLOGY REPORT*  Clinical Data:  History of epilepsy.  Dizziness.  Altered level of consciousness.  Headaches.  Question basilar stenosis?  CT ANGIOGRAPHY HEAD AND NECK  Technique:  Multidetector CT imaging of the head and neck was performed using the standard protocol during bolus administration of intravenous contrast.  Multiplanar CT image reconstructions including MIPs were obtained to evaluate the vascular anatomy. Carotid stenosis measurements (when applicable) are obtained utilizing NASCET criteria, using the distal internal carotid diameter as the denominator.  Contrast: 50mL OMNIPAQUE IOHEXOL 350 MG/ML IV SOLN  Comparison:  12/29/2010 MR brain.  CTA NECK  Findings:  Fatty replacement of the right parotid gland.  Tiny calcifications with intraparotid lymph nodes/masses left parotid gland.  Increased number of normal to enlarged lymph nodes throughout the neck.  As an index lymph node, left level II lymph node measures 1.3 x 0.8 x 1.9 cm.  Although these lymph nodes may be reactive origin, tumor (lymphoma/head and neck) not excluded.  No evidence of carotid artery stenosis or dissection.  Ectatic distal vertical cervical segment of the internal carotid artery just proximal to the skull base bilaterally without pleated appearance as may be seen with fibromuscular dysplasia.  Evaluation of the proximal aspect of the right vertebral artery is limited by streak artery from injected contrast.  No obvious  narrowing or dissection of either vertebral artery.  Cervical spondylotic changes most prominent C6-7.   Review of the MIP images confirms the above findings.  IMPRESSION: No evidence of carotid artery stenosis or dissection.  Ectatic distal vertical cervical segment of the internal carotid artery just proximal to the skull base  bilaterally without pleated appearance as may be seen with fibromuscular dysplasia.  Evaluation of the proximal aspect of the right vertebral artery is limited by streak artery from injected contrast.  No obvious narrowing or dissection of either vertebral artery.  Fatty replacement of the right parotid gland.  Tiny calcifications with intraparotid lymph nodes/masses left parotid gland.  Increased number of normal to enlarged lymph nodes throughout the neck.  As an index lymph node, left level II lymph node measures 1.3 x 0.8 x 1.9 cm.  Although these lymph nodes may be reactive origin, tumor (lymphoma/head and neck) not excluded.  CTA HEAD  Findings:  No intracranial hemorrhage.  No CT evidence of large acute infarct.  No intracranial enhancing lesion.  No hydrocephalus.  Previously questioned atrophy not as apparent on the present exam.  No significant stenosis of the codominant vertebral arteries. PICAs visualized.  Mild irregularity of the basilar artery without high-grade stenosis.  Both AICAs visualized.  No significant narrowing of the superior cerebellar arteries or posterior cerebral arteries.  Anterior circulation without to medium or large size vessel narrowing or occlusion. Mild calcification cavernous segment of the internal carotid artery bilaterally.  No aneurysm detected.  Mucosal thickening maxillary sinuses and ethmoid sinus air cells.   Review of the MIP images confirms the above findings.  IMPRESSION: No intracranial medium or large size vessel significant stenosis or occlusion as detailed above.  Original Report Authenticated By: Fuller Canada, M.D.   Dg Chest 2 View  12/29/2010  *RADIOLOGY REPORT*  Clinical Data: Dizziness and weakness  CHEST - 2 VIEW  Comparison: 12/26/2010  Findings: Heart size is normal.  No pleural effusion or pulmonary edema.  No airspace consolidation identified.   Review of the visualized osseous structures is negative.  IMPRESSION:  1.  No acute cardiopulmonary  abnormalities.  Original Report Authenticated By: Rosealee Albee, M.D.   Ct Angio Neck W/cm &/or Wo/cm  12/29/2010  *RADIOLOGY REPORT*  Clinical Data:  History of epilepsy.  Dizziness.  Altered level of consciousness.  Headaches.  Question basilar stenosis?  CT ANGIOGRAPHY HEAD AND NECK  Technique:  Multidetector CT imaging of the head and neck was performed using the standard protocol during bolus administration of intravenous contrast.  Multiplanar CT image reconstructions including MIPs were obtained to evaluate the vascular anatomy. Carotid stenosis measurements (when applicable) are obtained utilizing NASCET criteria, using the distal internal carotid diameter as the denominator.  Contrast: 50mL OMNIPAQUE IOHEXOL 350 MG/ML IV SOLN  Comparison:  12/29/2010 MR brain.  CTA NECK  Findings:  Fatty replacement of the right parotid gland.  Tiny calcifications with intraparotid lymph nodes/masses left parotid gland.  Increased number of normal to enlarged lymph nodes throughout the neck.  As an index lymph node, left level II lymph node measures 1.3 x 0.8 x 1.9 cm.  Although these lymph nodes may be reactive origin, tumor (lymphoma/head and neck) not excluded.  No evidence of carotid artery stenosis or dissection.  Ectatic distal vertical cervical segment of the internal carotid artery just proximal to the skull base bilaterally without pleated appearance as may be seen with fibromuscular dysplasia.  Evaluation of the proximal aspect of the right vertebral  artery is limited by streak artery from injected contrast.  No obvious narrowing or dissection of either vertebral artery.  Cervical spondylotic changes most prominent C6-7.   Review of the MIP images confirms the above findings.  IMPRESSION: No evidence of carotid artery stenosis or dissection.  Ectatic distal vertical cervical segment of the internal carotid artery just proximal to the skull base bilaterally without pleated appearance as may be seen with  fibromuscular dysplasia.  Evaluation of the proximal aspect of the right vertebral artery is limited by streak artery from injected contrast.  No obvious narrowing or dissection of either vertebral artery.  Fatty replacement of the right parotid gland.  Tiny calcifications with intraparotid lymph nodes/masses left parotid gland.  Increased number of normal to enlarged lymph nodes throughout the neck.  As an index lymph node, left level II lymph node measures 1.3 x 0.8 x 1.9 cm.  Although these lymph nodes may be reactive origin, tumor (lymphoma/head and neck) not excluded.  CTA HEAD  Findings:  No intracranial hemorrhage.  No CT evidence of large acute infarct.  No intracranial enhancing lesion.  No hydrocephalus.  Previously questioned atrophy not as apparent on the present exam.  No significant stenosis of the codominant vertebral arteries. PICAs visualized.  Mild irregularity of the basilar artery without high-grade stenosis.  Both AICAs visualized.  No significant narrowing of the superior cerebellar arteries or posterior cerebral arteries.  Anterior circulation without to medium or large size vessel narrowing or occlusion. Mild calcification cavernous segment of the internal carotid artery bilaterally.  No aneurysm detected.  Mucosal thickening maxillary sinuses and ethmoid sinus air cells.   Review of the MIP images confirms the above findings.  IMPRESSION: No intracranial medium or large size vessel significant stenosis or occlusion as detailed above.  Original Report Authenticated By: Fuller Canada, M.D.   Mr Brain Wo Contrast  12/29/2010  *RADIOLOGY REPORT*  Clinical Data: Dizziness with unsteady gait.  Questionable seizures.  MRI HEAD WITHOUT CONTRAST  Technique:  Multiplanar, multiecho pulse sequences of the brain and surrounding structures were obtained according to standard protocol without intravenous contrast.  Comparison: 08/22/2010 MR and CT.  Findings: Motion degraded exam.  No acute infarct.   No intracranial hemorrhage.  Mild global atrophy without hydrocephalus.  No intracranial mass lesion or seizure focus detected on this unenhanced motion degraded exam.  Major intracranial vascular structures appear grossly patent.  Minimal paranasal sinus mucosal thickening.  IMPRESSION: Motion degraded exam without acute abnormality.  Please see above.  Original Report Authenticated By: Fuller Canada, M.D.    Medications:  I have reviewed the patient's current medications. Scheduled:   . acetaminophen  650 mg Oral Once  . cefTRIAXone (ROCEPHIN)  IV  2 g Intravenous Q24H  . cefTRIAXone (ROCEPHIN)  IV  2 g Intravenous Once  . lacosamide  100 mg Oral Once  . lacosamide  100 mg Oral To Minor  . lacosamide  100 mg Oral BID  . lamoTRIgine  200 mg Oral Once  . lamoTRIgine  200 mg Oral BID  . lamoTRIgine  200 mg Oral To Minor  . LORazepam      . LORazepam      . LORazepam  2 mg Intravenous Once  . meclizine  25 mg Oral Once  . meclizine  25 mg Oral TID  . moxifloxacin  400 mg Intravenous Q24H  . oseltamivir  75 mg Oral To Minor  . oseltamivir  75 mg Oral BID  . PARoxetine  20 mg Oral QHS  . PARoxetine  20 mg Oral To Minor  . potassium chloride  10 mEq Intravenous Once  . potassium chloride  20 mEq Oral Once  . sodium chloride  1,000 mL Intravenous Once  . vancomycin  1,000 mg Intravenous Once  . vancomycin  1,000 mg Intravenous Q8H  . DISCONTD: heparin  5,000 Units Subcutaneous Q8H  . DISCONTD: Lacosamide  1 tablet Oral BID  . DISCONTD: LORazepam  1 mg Intravenous Once  . DISCONTD: LORazepam  2 mg Intravenous Once  . DISCONTD: oseltamivir  75 mg Oral Q12H  . DISCONTD: vancomycin  1,000 mg Intravenous Q8H    Assessment/Plan:  Patient Active Hospital Problem List: Epilepsy (12/29/2010)   Assessment: H/o epilepsy. No seizures noted.  Continues on home meds of Lamictal and Vimpat   Plan: Continue home AED's Altered Mental Status   Assessment: No evidence of stroke.  With low  grade temps, increased wbc count and response to antibiotics (exam improved), likely infection.  Cultures and LP results pending.     Plan: 1. Will follow up results of cultures.            2. Agree with antibiotics    LOS: 1 day   Thana Farr, MD [facility] [phone] 12/30/2010  11:21 AM

## 2010-12-30 NOTE — Progress Notes (Signed)
Speech Language/Pathology Clinical/Bedside Swallow Evaluation Patient Details  Name: [name] MRN: [id number] DOB: 1965/11/03 Today's Date: 12/30/2010  Past Medical History:  Past Medical History  Diagnosis Date  . Epilepsy   . Seizures   . Vertigo   . Constipation    Past Surgical History:  Past Surgical History  Procedure Date  . Total hip arthroplasty   . Abdominal hysterectomy   . Colonoscopy   . Joint replacement    Pt is a 45 year old female with a seizure disorder.  Recent seizures possibly due to infectious process. MRI negative. Pt denies any difficutly swallowing.  Pt has a lateral lisp at baseline. Pt is not dysarthric.  Assessment/Recommendations/Treatment Plan    SLP Assessment Clinical Impression Statement: Pt appears WFL with all consistencies tested.  SLP provided basic aspriation precautions given pts seizure disorder.  No f/u needed at this time.  Please reorder if needed, SLP will signn off.   Recommendations Solid Consistency: Regular Liquid Consistency: Thin Compensations: Slow rate;Small sips/bites Postural Changes and/or Swallow Maneuvers: Seated upright 90 degrees  Treatment Plan Treatment Plan Recommendations: No treatment recommended at this time     Individuals Consulted Consulted and Agree with Results and Recommendations: Patient  Harlon Ditty, MA CCC-SLP [phone]  Claudine Mouton 12/30/2010,3:47 PM

## 2010-12-30 NOTE — Procedures (Signed)
EEG NUMBER:  REFERRING PHYSICIAN:  Hind I Elsaid, MD  HISTORY:  A 45 year old female with history of seizures.  MEDICATIONS:  Vimpat, Rocephin, Lamictal, Antivert, Avelox, Paxil, vancomycin.  CONDITIONS OF RECORDING:  This is a 16-channel EEG carried out with the patient in the awake, drowsy, and asleep states.  DESCRIPTION:  The waking background activity is marred by muscle and movement artifact, but a posterior background rhythm can be evaluated at the 9-10 Hz, that is a low-voltage and seen symmetrically over both hemispheres.  It is noted from the parieto-occipital and posterotemporal regions.  Low-voltage, fast activity, poorly organized was seen anteriorly at times superimposed on more posterior rhythms.  A mixture of theta and alpha was seen from the central and temporal regions.  The patient drowses with slowing to irregular which is theta and beta activity.  The patient goes into a light sleep with symmetrical sleep spindles, vertex with sharp activity and irregular slow activity. Hypoventilation was not performed.  Intermittent photic stimulation failed to elicit any abnormalities.  IMPRESSION:  This is a normal EEG.          ______________________________ Thana Farr, MD    [id number] D:  12/30/2010 18:14:17  T:  12/30/2010 21:06:52  Job #:  [id number]

## 2010-12-30 NOTE — Progress Notes (Signed)
Subjective: Patient sleepy but easily aroused and oriented x3, complaining of hip pain, and states usually relieved by ibuprofen at home.  Objective: Vital signs in last 24 hours: Temp:  [97.9 F (36.6 C)-99.3 F (37.4 C)] 99.3 F (37.4 C) (12/17 1619) Pulse Rate:  [76-113] 108  (12/17 1619) Resp:  [15-29] 20  (12/17 1619) BP: (89-145)/(58-128) 110/70 mmHg (12/17 1619) SpO2:  [89 %-98 %] 91 % (12/17 1619) Weight:  [78.9 kg (173 lb 15.1 oz)] 173 lb 15.1 oz (78.9 kg) (12/17 1130) Last BM Date: 12/29/10 Intake/Output from previous day: 12/16 0701 - 12/17 0700 In: -  Out: 1500 [Urine:1500] Intake/Output this shift: Total I/O In: 650 [P.O.:75; I.V.:375; IV Piggyback:200] Out: 400 [Urine:400]    General Appearance:   sleepy but easily, cooperative, no distress  Lungs:     Clear to auscultation bilaterally, respirations unlabored   Heart:    Regular rate and rhythm, S1 and S2 normal, no murmur, rub   or gallop  Abdomen:     Soft, non-tender, bowel sounds active all four quadrants,    no masses, no organomegaly  Extremities:   Extremities normal, atraumatic, no cyanosis or edema  Neurologic:   CNII-XII intact, strength 5 out of 5 and symmetric      Weight change:   Intake/Output Summary (Last 24 hours) at 12/30/10 1704 Last data filed at 12/30/10 1600  Gross per 24 hour  Intake    650 ml  Output    400 ml  Net    250 ml    Lab Results:   Basename 12/29/10 1800 12/29/10 1229  NA -- 141  K -- 3.0*  CL -- 104  CO2 -- 27  GLUCOSE -- 109*  BUN -- 5*  CREATININE 0.59 0.60  CALCIUM -- 8.7    Basename 12/29/10 1758 12/29/10 1229  WBC 18.5* 19.8*  HGB 10.7* 11.4*  HCT 31.3* 33.3*  PLT 210 219  MCV 88.2 89.0   PT/INR No results found for this basename: LABPROT:2,INR:2 in the last 72 hours ABG  Basename 12/29/10 2313  PHART 7.457*  HCO3 24.8*   Micro Results: Recent Results (from the past 240 hour(s))  URINE CULTURE     Status: Normal   Collection Time   12/26/10  7:18 PM      Component Value Range Status Comment   Specimen Description URINE, CLEAN CATCH   Final    Special Requests NONE   Final    Setup Time [id number]   Final    Colony Count >=100,000 COLONIES/ML   Final    Culture     Final    Value: Multiple bacterial morphotypes present, none predominant. Suggest appropriate recollection if clinically indicated.   Report Status 12/27/2010 FINAL   Final   MRSA PCR SCREENING     Status: Normal   Collection Time   12/30/10 10:32 AM      Component Value Range Status Comment   MRSA by PCR NEGATIVE  NEGATIVE  Final    Tube # 3 Color, CSF COLORLESS COLORLESS Appearance, CSF CLEAR CLEAR Supernatant NOT INDICATED RBC Count, CSF 96 (H) 0 /cu mm WBC, CSF 1 0 - 5 /cu mm Segmented Neutrophils-CSF RARE 0 - 6 % Lymphs, CSF RARE 40 - 80 % Monocyte-Macrophage-Spinal Fluid RARE 15 - 45 % Other Cells, CSF TOO FEW TO COUNT, SMEAR AVAILABLE FOR REVIEW    Studies/Results: Ct Angio Head W/cm &/or Wo Cm  12/29/2010  *RADIOLOGY REPORT*  Clinical Data:  History of  epilepsy.  Dizziness.  Altered level of consciousness.  Headaches.  Question basilar stenosis?  CT ANGIOGRAPHY HEAD AND NECK  Technique:  Multidetector CT imaging of the head and neck was performed using the standard protocol during bolus administration of intravenous contrast.  Multiplanar CT image reconstructions including MIPs were obtained to evaluate the vascular anatomy. Carotid stenosis measurements (when applicable) are obtained utilizing NASCET criteria, using the distal internal carotid diameter as the denominator.  Contrast: 50mL OMNIPAQUE IOHEXOL 350 MG/ML IV SOLN  Comparison:  12/29/2010 MR brain.  CTA NECK  Findings:  Fatty replacement of the right parotid gland.  Tiny calcifications with intraparotid lymph nodes/masses left parotid gland.  Increased number of normal to enlarged lymph nodes throughout the neck.  As an index lymph node, left level II lymph node measures 1.3 x 0.8 x 1.9 cm.   Although these lymph nodes may be reactive origin, tumor (lymphoma/head and neck) not excluded.  No evidence of carotid artery stenosis or dissection.  Ectatic distal vertical cervical segment of the internal carotid artery just proximal to the skull base bilaterally without pleated appearance as may be seen with fibromuscular dysplasia.  Evaluation of the proximal aspect of the right vertebral artery is limited by streak artery from injected contrast.  No obvious narrowing or dissection of either vertebral artery.  Cervical spondylotic changes most prominent C6-7.   Review of the MIP images confirms the above findings.  IMPRESSION: No evidence of carotid artery stenosis or dissection.  Ectatic distal vertical cervical segment of the internal carotid artery just proximal to the skull base bilaterally without pleated appearance as may be seen with fibromuscular dysplasia.  Evaluation of the proximal aspect of the right vertebral artery is limited by streak artery from injected contrast.  No obvious narrowing or dissection of either vertebral artery.  Fatty replacement of the right parotid gland.  Tiny calcifications with intraparotid lymph nodes/masses left parotid gland.  Increased number of normal to enlarged lymph nodes throughout the neck.  As an index lymph node, left level II lymph node measures 1.3 x 0.8 x 1.9 cm.  Although these lymph nodes may be reactive origin, tumor (lymphoma/head and neck) not excluded.  CTA HEAD  Findings:  No intracranial hemorrhage.  No CT evidence of large acute infarct.  No intracranial enhancing lesion.  No hydrocephalus.  Previously questioned atrophy not as apparent on the present exam.  No significant stenosis of the codominant vertebral arteries. PICAs visualized.  Mild irregularity of the basilar artery without high-grade stenosis.  Both AICAs visualized.  No significant narrowing of the superior cerebellar arteries or posterior cerebral arteries.  Anterior circulation without  to medium or large size vessel narrowing or occlusion. Mild calcification cavernous segment of the internal carotid artery bilaterally.  No aneurysm detected.  Mucosal thickening maxillary sinuses and ethmoid sinus air cells.   Review of the MIP images confirms the above findings.  IMPRESSION: No intracranial medium or large size vessel significant stenosis or occlusion as detailed above.  Original Report Authenticated By: Fuller Canada, M.D.   Dg Chest 2 View  12/29/2010  *RADIOLOGY REPORT*  Clinical Data: Dizziness and weakness  CHEST - 2 VIEW  Comparison: 12/26/2010  Findings: Heart size is normal.  No pleural effusion or pulmonary edema.  No airspace consolidation identified.   Review of the visualized osseous structures is negative.  IMPRESSION:  1.  No acute cardiopulmonary abnormalities.  Original Report Authenticated By: Rosealee Albee, M.D.   Ct Angio Neck W/cm &/or  Wo/cm  12/29/2010  *RADIOLOGY REPORT*  Clinical Data:  History of epilepsy.  Dizziness.  Altered level of consciousness.  Headaches.  Question basilar stenosis?  CT ANGIOGRAPHY HEAD AND NECK  Technique:  Multidetector CT imaging of the head and neck was performed using the standard protocol during bolus administration of intravenous contrast.  Multiplanar CT image reconstructions including MIPs were obtained to evaluate the vascular anatomy. Carotid stenosis measurements (when applicable) are obtained utilizing NASCET criteria, using the distal internal carotid diameter as the denominator.  Contrast: 50mL OMNIPAQUE IOHEXOL 350 MG/ML IV SOLN  Comparison:  12/29/2010 MR brain.  CTA NECK  Findings:  Fatty replacement of the right parotid gland.  Tiny calcifications with intraparotid lymph nodes/masses left parotid gland.  Increased number of normal to enlarged lymph nodes throughout the neck.  As an index lymph node, left level II lymph node measures 1.3 x 0.8 x 1.9 cm.  Although these lymph nodes may be reactive origin, tumor  (lymphoma/head and neck) not excluded.  No evidence of carotid artery stenosis or dissection.  Ectatic distal vertical cervical segment of the internal carotid artery just proximal to the skull base bilaterally without pleated appearance as may be seen with fibromuscular dysplasia.  Evaluation of the proximal aspect of the right vertebral artery is limited by streak artery from injected contrast.  No obvious narrowing or dissection of either vertebral artery.  Cervical spondylotic changes most prominent C6-7.   Review of the MIP images confirms the above findings.  IMPRESSION: No evidence of carotid artery stenosis or dissection.  Ectatic distal vertical cervical segment of the internal carotid artery just proximal to the skull base bilaterally without pleated appearance as may be seen with fibromuscular dysplasia.  Evaluation of the proximal aspect of the right vertebral artery is limited by streak artery from injected contrast.  No obvious narrowing or dissection of either vertebral artery.  Fatty replacement of the right parotid gland.  Tiny calcifications with intraparotid lymph nodes/masses left parotid gland.  Increased number of normal to enlarged lymph nodes throughout the neck.  As an index lymph node, left level II lymph node measures 1.3 x 0.8 x 1.9 cm.  Although these lymph nodes may be reactive origin, tumor (lymphoma/head and neck) not excluded.  CTA HEAD  Findings:  No intracranial hemorrhage.  No CT evidence of large acute infarct.  No intracranial enhancing lesion.  No hydrocephalus.  Previously questioned atrophy not as apparent on the present exam.  No significant stenosis of the codominant vertebral arteries. PICAs visualized.  Mild irregularity of the basilar artery without high-grade stenosis.  Both AICAs visualized.  No significant narrowing of the superior cerebellar arteries or posterior cerebral arteries.  Anterior circulation without to medium or large size vessel narrowing or occlusion.  Mild calcification cavernous segment of the internal carotid artery bilaterally.  No aneurysm detected.  Mucosal thickening maxillary sinuses and ethmoid sinus air cells.   Review of the MIP images confirms the above findings.  IMPRESSION: No intracranial medium or large size vessel significant stenosis or occlusion as detailed above.  Original Report Authenticated By: Fuller Canada, M.D.   Mr Brain Wo Contrast  12/29/2010  *RADIOLOGY REPORT*  Clinical Data: Dizziness with unsteady gait.  Questionable seizures.  MRI HEAD WITHOUT CONTRAST  Technique:  Multiplanar, multiecho pulse sequences of the brain and surrounding structures were obtained according to standard protocol without intravenous contrast.  Comparison: 08/22/2010 MR and CT.  Findings: Motion degraded exam.  No acute infarct.  No intracranial  hemorrhage.  Mild global atrophy without hydrocephalus.  No intracranial mass lesion or seizure focus detected on this unenhanced motion degraded exam.  Major intracranial vascular structures appear grossly patent.  Minimal paranasal sinus mucosal thickening.  IMPRESSION: Motion degraded exam without acute abnormality.  Please see above.  Original Report Authenticated By: Fuller Canada, M.D.   Dg Fluoro Guide Ndl Plc/bx  12/30/2010  *RADIOLOGY REPORT*  Clinical Data:  Altered mental status.  DIAGNOSTIC LUMBAR PUNCTURE UNDER FLUOROSCOPIC GUIDANCE  Fluoroscopy time:  0.28 minutes.  Technique:  Informed consent was obtained from the patient prior to the procedure, including potential complications of headache, allergy, and pain.   With the patient prone, the lower back was prepped with Betadine.  1% Lidocaine was used for local anesthesia. Lumbar puncture was performed at the left paramidline L1-2 level using a 20 gauge needle with return of somewhat bloody CSF.  Good flow could not be established.  I therefore re-punctured via a right paramidline approach at L2-3.  Clear CSF was obtained.  The opening  pressure was 17.5 cm water, within normal limits.   10.0 ml of CSF were obtained for laboratory studies.  The patient tolerated the procedure well and there were no apparent complications.  IMPRESSION:  1.  Technically successful fluoroscopic guided lumbar puncture via a right paramidline approach at L2-3. 2.  An initial attempts of the left paramidline approach at L1-2 was unsuccessful. 3.  The opening pressure was within normal limits at 17.5 cm water. 4.  10 ml of clear CSF was sent for laboratory examination as ordered.  Original Report Authenticated By: Jamesetta Orleans. MATTERN, M.D.   Medications:  Scheduled Meds:   . cefTRIAXone (ROCEPHIN)  IV  2 g Intravenous Q24H  . cefTRIAXone (ROCEPHIN)  IV  2 g Intravenous Once  . lacosamide  100 mg Oral To Minor  . lacosamide  100 mg Oral BID  . lamoTRIgine  200 mg Oral BID  . lamoTRIgine  200 mg Oral To Minor  . LORazepam      . LORazepam      . meclizine  25 mg Oral TID  . moxifloxacin  400 mg Intravenous Q24H  . oseltamivir  75 mg Oral To Minor  . oseltamivir  75 mg Oral BID  . PARoxetine  20 mg Oral QHS  . PARoxetine  20 mg Oral To Minor  . potassium chloride  20 mEq Oral Once  . vancomycin  1,000 mg Intravenous Once  . vancomycin  1,000 mg Intravenous Q8H  . DISCONTD: heparin  5,000 Units Subcutaneous Q8H  . DISCONTD: Lacosamide  1 tablet Oral BID  . DISCONTD: oseltamivir  75 mg Oral Q12H  . DISCONTD: vancomycin  1,000 mg Intravenous Q8H   Continuous Infusions:   . sodium chloride 75 mL/hr at 12/30/10 1600   PRN Meds:.acetaminophen, ibuprofen, iohexol Assessment/Plan: Patient Active Hospital Problem List: 1.Seizure (12/29/2010)- Continue outpatient meds, appreciate neuro assistance. 2.SIRS- patient with tachycardia, and leukocytosis, and altered mental status on admission arm, status post LP- on empiric antibiotics to cover for possible meningitis, follow Gram stain and cultures.  2- tachy cardia: likely underlying infection and  dehydration,septic work up in progress- procalcitonin levels within normal limits.  3- leukocytosis: differential mainly neutrophils on empiric antibiotics or possible meningitis follow and recheck. 4- hypokalemia-recheck potassium in the a.m.     LOS: 1 day   [name] C 12/30/2010, 5:05 PM

## 2010-12-30 NOTE — Procedures (Signed)
Fluoroscopic lumbar puncture first attempted on the left at L1/2 without success.  There was some blood in the needle, presumably from the venous plexus.  Successful puncture on the right at L2/3.  Opening pressure was 17.5 cm water.  9 ml of clear fluid obtained and sent to lab for order per primary team.  No complication.

## 2010-12-31 LAB — CBC
HCT: 30.9 % — ABNORMAL LOW (ref 36.0–46.0)
Hemoglobin: 10.2 g/dL — ABNORMAL LOW (ref 12.0–15.0)
MCH: 29.4 pg (ref 26.0–34.0)
MCHC: 33 g/dL (ref 30.0–36.0)
MCV: 89 fL (ref 78.0–100.0)
Platelets: 242 10*3/uL (ref 150–400)
RBC: 3.47 MIL/uL — ABNORMAL LOW (ref 3.87–5.11)
RDW: 13.4 % (ref 11.5–15.5)
WBC: 7.3 10*3/uL (ref 4.0–10.5)

## 2010-12-31 LAB — URINE CULTURE
Colony Count: NO GROWTH
Culture  Setup Time: 201212171723
Culture: NO GROWTH

## 2010-12-31 LAB — HERPES SIMPLEX VIRUS(HSV) DNA BY PCR
HSV 1 DNA: NOT DETECTED
HSV 2 DNA: NOT DETECTED

## 2010-12-31 LAB — BASIC METABOLIC PANEL
BUN: 10 mg/dL (ref 6–23)
CO2: 25 mEq/L (ref 19–32)
Calcium: 8.3 mg/dL — ABNORMAL LOW (ref 8.4–10.5)
Chloride: 107 mEq/L (ref 96–112)
Creatinine, Ser: 0.6 mg/dL (ref 0.50–1.10)
GFR calc Af Amer: >90 mL/min (ref >90)
GFR calc non Af Amer: >90 mL/min (ref >90)
Glucose, Bld: 83 mg/dL (ref 70–99)
Potassium: 3.3 mEq/L — ABNORMAL LOW (ref 3.5–5.1)
Sodium: 140 mEq/L (ref 135–145)

## 2010-12-31 LAB — VANCOMYCIN, TROUGH: Vancomycin Tr: 14 ug/mL (ref 10.0–20.0)

## 2010-12-31 MED ORDER — POTASSIUM CHLORIDE CRYS ER 20 MEQ PO TBCR
40.0000 meq | EXTENDED_RELEASE_TABLET | ORAL | Status: AC
  Administered 2010-12-31 – 2011-01-01 (×3): 40 meq via ORAL
  Filled 2010-12-31 (×3): qty 2
  Filled 2010-12-31: qty 1

## 2010-12-31 MED ORDER — VANCOMYCIN HCL 1000 MG IV SOLR
1250.0000 mg | Freq: Three times a day (TID) | INTRAVENOUS | Status: DC
  Administered 2011-01-01 (×2): 1250 mg via INTRAVENOUS
  Filled 2010-12-31 (×4): qty 1250

## 2010-12-31 MED ORDER — CLONAZEPAM 0.5 MG PO TABS
0.5000 mg | ORAL_TABLET | Freq: Every evening | ORAL | Status: DC | PRN
  Administered 2010-12-31 – 2011-01-01 (×2): 0.5 mg via ORAL
  Filled 2010-12-31 (×2): qty 1

## 2010-12-31 MED ORDER — DEXTROSE 5 % IV SOLN
10.0000 mg/kg | Freq: Three times a day (TID) | INTRAVENOUS | Status: DC
  Administered 2010-12-31 – 2011-01-01 (×3): 455 mg via INTRAVENOUS
  Filled 2010-12-31 (×5): qty 9.1

## 2010-12-31 MED ORDER — WHITE PETROLATUM GEL
Status: AC
  Administered 2011-01-01: 07:00:00
  Filled 2010-12-31: qty 5

## 2010-12-31 MED ORDER — ENOXAPARIN SODIUM 40 MG/0.4ML ~~LOC~~ SOLN
40.0000 mg | SUBCUTANEOUS | Status: DC
  Administered 2010-12-31 – 2011-01-02 (×3): 40 mg via SUBCUTANEOUS
  Filled 2010-12-31 (×3): qty 0.4

## 2010-12-31 MED ORDER — DEXTROSE 5 % IV SOLN
2.0000 g | Freq: Two times a day (BID) | INTRAVENOUS | Status: DC
  Administered 2010-12-31 – 2011-01-01 (×3): 2 g via INTRAVENOUS
  Filled 2010-12-31 (×4): qty 2

## 2010-12-31 NOTE — Progress Notes (Signed)
Subjective: Pt is awake and alert and complaining of some shakiness when she walks. Her L hip is hurting her slightly worse than her usual arthritis pain. No other complaints. No confusion. Is feeling better but still not back to her usual state of health. She still complains of headache. Spinal fluid showed elevated red cells but herpes PCR is not back yet Objective: Vital signs in last 24 hours: Filed Vitals:   12/31/10 0600 12/31/10 0700 12/31/10 0800 12/31/10 0816  BP: 118/75 123/90 117/81   Pulse: 87 98 90   Temp:    98.9 F (37.2 C)  TempSrc:    Oral  Resp: 27 21 18    Height:      Weight:      SpO2: 95% 97% 95%    Weight change: 3 lb 15.1 oz (1.789 kg)  Intake/Output Summary (Last 24 hours) at 12/31/10 0951 Last data filed at 12/31/10 0800  Gross per 24 hour  Intake   2575 ml  Output   2325 ml  Net    250 ml   Physical Exam: Neurologic Exam:  Mental Status:  Alert, oriented, thought content appropriate. Speech fluent without evidence of aphasia but with a mild dysarthria. Able to follow 3 step commands without difficulty.  Cranial Nerves:  II: visual fields grossly normal, pupils equal, round, reactive to light and accommodation  III,IV, VI: ptosis not present, extra-ocular motions intact bilaterally  V,VII: smile symmetric, facial light touch sensation normal bilaterally  VIII: hearing normal bilaterally  IX,X: gag reflex present  XI: trapezius strength/neck flexion strength normal bilaterally  XII: tongue strength normal  Motor:  Right : Upper extremity 5/5 Left: Upper extremity 5/5  Lower extremity 5/5 Lower extremity 5/5  Tone and bulk:normal tone throughout; no atrophy noted  Sensory: Pinprick and light touch intact throughout, bilaterally  Deep Tendon Reflexes: 3+ and symmetric throughout  Plantars:  Right: downgoing Left: downgoing  Cerebellar:  normal finger-to-nose and normal heel-to-shin test (mild shakiness seen with LLE)  Lab Results: Basic Metabolic  Panel:  Lab 12/31/10 0420 12/29/10 1800 12/29/10 1229  NA 140 -- 141  K 3.3* -- 3.0*  CL 107 -- 104  CO2 25 -- 27  GLUCOSE 83 -- 109*  BUN 10 -- 5*  CREATININE 0.60 0.59 --  CALCIUM 8.3* -- 8.7  MG -- -- --  PHOS -- -- --  CBC:  Lab 12/31/10 0420 12/29/10 1758 12/26/10 1918  WBC 7.3 18.5* --  NEUTROABS -- 14.9* 6.7  HGB 10.2* 10.7* --  HCT 30.9* 31.3* --  MCV 89.0 88.2 --  PLT 242 210 --   Cardiac Enzymes:  Lab 12/30/10 1607 12/30/10 0749 12/30/10 0350  CKTOTAL 295* 341* 363*  CKMB 3.3 3.5 3.5  CKMBINDEX -- -- --  TROPONINI <0.30 <0.30 <0.30   Urine Drug Screen: Drugs of Abuse     Component Value Date/Time   LABOPIA NONE DETECTED 10/08/2010 1449   COCAINSCRNUR NONE DETECTED 10/08/2010 1449   LABBENZ NONE DETECTED 10/08/2010 1449   AMPHETMU NONE DETECTED 10/08/2010 1449   THCU NONE DETECTED 10/08/2010 1449   LABBARB NONE DETECTED 10/08/2010 1449    Micro Results: Recent Results (from the past 240 hour(s))  URINE CULTURE     Status: Normal   Collection Time   12/26/10  7:18 PM      Component Value Range Status Comment   Specimen Description URINE, CLEAN CATCH   Final    Special Requests NONE   Final    Setup  Time [id number]   Final    Colony Count >=100,000 COLONIES/ML   Final    Culture     Final    Value: Multiple bacterial morphotypes present, none predominant. Suggest appropriate recollection if clinically indicated.   Report Status 12/27/2010 FINAL   Final   CULTURE, BLOOD (ROUTINE X 2)     Status: Normal (Preliminary result)   Collection Time   12/29/10  9:00 PM      Component Value Range Status Comment   Specimen Description BLOOD RIGHT ARM   Final    Special Requests BOTTLES DRAWN AEROBIC AND ANAEROBIC 10CC EA   Final    Setup Time [id number]   Final    Culture     Final    Value:        BLOOD CULTURE RECEIVED NO GROWTH TO DATE CULTURE WILL BE HELD FOR 5 DAYS BEFORE ISSUING A FINAL NEGATIVE REPORT   Report Status PENDING   Incomplete   CULTURE,  BLOOD (ROUTINE X 2)     Status: Normal (Preliminary result)   Collection Time   12/29/10  9:11 PM      Component Value Range Status Comment   Specimen Description BLOOD RIGHT HAND   Final    Special Requests BOTTLES DRAWN AEROBIC ONLY 10CC   Final    Setup Time [id number]   Final    Culture     Final    Value:        BLOOD CULTURE RECEIVED NO GROWTH TO DATE CULTURE WILL BE HELD FOR 5 DAYS BEFORE ISSUING A FINAL NEGATIVE REPORT   Report Status PENDING   Incomplete   CSF CULTURE     Status: Normal (Preliminary result)   Collection Time   12/30/10  9:30 AM      Component Value Range Status Comment   Specimen Description CSF   Final    Special Requests 2.0ML   Final    Gram Stain     Final    Value: CYTOSPIN NO WBC SEEN     NO ORGANISMS SEEN   Culture PENDING   Incomplete    Report Status PENDING   Incomplete   MRSA PCR SCREENING     Status: Normal   Collection Time   12/30/10 10:32 AM      Component Value Range Status Comment   MRSA by PCR NEGATIVE  NEGATIVE  Final    Studies/Results: Ct Angio Head W/cm &/or Wo Cm  12/29/2010  *RADIOLOGY REPORT*  Clinical Data:  History of epilepsy.  Dizziness.  Altered level of consciousness.  Headaches.  Question basilar stenosis?  CT ANGIOGRAPHY HEAD AND NECK  Technique:  Multidetector CT imaging of the head and neck was performed using the standard protocol during bolus administration of intravenous contrast.  Multiplanar CT image reconstructions including MIPs were obtained to evaluate the vascular anatomy. Carotid stenosis measurements (when applicable) are obtained utilizing NASCET criteria, using the distal internal carotid diameter as the denominator.  Contrast: 50mL OMNIPAQUE IOHEXOL 350 MG/ML IV SOLN  Comparison:  12/29/2010 MR brain.  CTA NECK  Findings:  Fatty replacement of the right parotid gland.  Tiny calcifications with intraparotid lymph nodes/masses left parotid gland.  Increased number of normal to enlarged lymph nodes throughout the  neck.  As an index lymph node, left level II lymph node measures 1.3 x 0.8 x 1.9 cm.  Although these lymph nodes may be reactive origin, tumor (lymphoma/head and neck) not excluded.  No evidence of carotid artery  stenosis or dissection.  Ectatic distal vertical cervical segment of the internal carotid artery just proximal to the skull base bilaterally without pleated appearance as may be seen with fibromuscular dysplasia.  Evaluation of the proximal aspect of the right vertebral artery is limited by streak artery from injected contrast.  No obvious narrowing or dissection of either vertebral artery.  Cervical spondylotic changes most prominent C6-7.   Review of the MIP images confirms the above findings.  IMPRESSION: No evidence of carotid artery stenosis or dissection.  Ectatic distal vertical cervical segment of the internal carotid artery just proximal to the skull base bilaterally without pleated appearance as may be seen with fibromuscular dysplasia.  Evaluation of the proximal aspect of the right vertebral artery is limited by streak artery from injected contrast.  No obvious narrowing or dissection of either vertebral artery.  Fatty replacement of the right parotid gland.  Tiny calcifications with intraparotid lymph nodes/masses left parotid gland.  Increased number of normal to enlarged lymph nodes throughout the neck.  As an index lymph node, left level II lymph node measures 1.3 x 0.8 x 1.9 cm.  Although these lymph nodes may be reactive origin, tumor (lymphoma/head and neck) not excluded.  CTA HEAD  Findings:  No intracranial hemorrhage.  No CT evidence of large acute infarct.  No intracranial enhancing lesion.  No hydrocephalus.  Previously questioned atrophy not as apparent on the present exam.  No significant stenosis of the codominant vertebral arteries. PICAs visualized.  Mild irregularity of the basilar artery without high-grade stenosis.  Both AICAs visualized.  No significant narrowing of the  superior cerebellar arteries or posterior cerebral arteries.  Anterior circulation without to medium or large size vessel narrowing or occlusion. Mild calcification cavernous segment of the internal carotid artery bilaterally.  No aneurysm detected.  Mucosal thickening maxillary sinuses and ethmoid sinus air cells.   Review of the MIP images confirms the above findings.  IMPRESSION: No intracranial medium or large size vessel significant stenosis or occlusion as detailed above.  Original Report Authenticated By: Fuller Canada, M.D.   Dg Chest 2 View  12/29/2010  *RADIOLOGY REPORT*  Clinical Data: Dizziness and weakness  CHEST - 2 VIEW  Comparison: 12/26/2010  Findings: Heart size is normal.  No pleural effusion or pulmonary edema.  No airspace consolidation identified.   Review of the visualized osseous structures is negative.  IMPRESSION:  1.  No acute cardiopulmonary abnormalities.  Original Report Authenticated By: Rosealee Albee, M.D.   Ct Angio Neck W/cm &/or Wo/cm  12/29/2010  *RADIOLOGY REPORT*  Clinical Data:  History of epilepsy.  Dizziness.  Altered level of consciousness.  Headaches.  Question basilar stenosis?  CT ANGIOGRAPHY HEAD AND NECK  Technique:  Multidetector CT imaging of the head and neck was performed using the standard protocol during bolus administration of intravenous contrast.  Multiplanar CT image reconstructions including MIPs were obtained to evaluate the vascular anatomy. Carotid stenosis measurements (when applicable) are obtained utilizing NASCET criteria, using the distal internal carotid diameter as the denominator.  Contrast: 50mL OMNIPAQUE IOHEXOL 350 MG/ML IV SOLN  Comparison:  12/29/2010 MR brain.  CTA NECK  Findings:  Fatty replacement of the right parotid gland.  Tiny calcifications with intraparotid lymph nodes/masses left parotid gland.  Increased number of normal to enlarged lymph nodes throughout the neck.  As an index lymph node, left level II lymph node  measures 1.3 x 0.8 x 1.9 cm.  Although these lymph nodes may be reactive origin,  tumor (lymphoma/head and neck) not excluded.  No evidence of carotid artery stenosis or dissection.  Ectatic distal vertical cervical segment of the internal carotid artery just proximal to the skull base bilaterally without pleated appearance as may be seen with fibromuscular dysplasia.  Evaluation of the proximal aspect of the right vertebral artery is limited by streak artery from injected contrast.  No obvious narrowing or dissection of either vertebral artery.  Cervical spondylotic changes most prominent C6-7.   Review of the MIP images confirms the above findings.  IMPRESSION: No evidence of carotid artery stenosis or dissection.  Ectatic distal vertical cervical segment of the internal carotid artery just proximal to the skull base bilaterally without pleated appearance as may be seen with fibromuscular dysplasia.  Evaluation of the proximal aspect of the right vertebral artery is limited by streak artery from injected contrast.  No obvious narrowing or dissection of either vertebral artery.  Fatty replacement of the right parotid gland.  Tiny calcifications with intraparotid lymph nodes/masses left parotid gland.  Increased number of normal to enlarged lymph nodes throughout the neck.  As an index lymph node, left level II lymph node measures 1.3 x 0.8 x 1.9 cm.  Although these lymph nodes may be reactive origin, tumor (lymphoma/head and neck) not excluded.  CTA HEAD  Findings:  No intracranial hemorrhage.  No CT evidence of large acute infarct.  No intracranial enhancing lesion.  No hydrocephalus.  Previously questioned atrophy not as apparent on the present exam.  No significant stenosis of the codominant vertebral arteries. PICAs visualized.  Mild irregularity of the basilar artery without high-grade stenosis.  Both AICAs visualized.  No significant narrowing of the superior cerebellar arteries or posterior cerebral arteries.   Anterior circulation without to medium or large size vessel narrowing or occlusion. Mild calcification cavernous segment of the internal carotid artery bilaterally.  No aneurysm detected.  Mucosal thickening maxillary sinuses and ethmoid sinus air cells.   Review of the MIP images confirms the above findings.  IMPRESSION: No intracranial medium or large size vessel significant stenosis or occlusion as detailed above.  Original Report Authenticated By: Fuller Canada, M.D.   Mr Brain Wo Contrast  12/29/2010  *RADIOLOGY REPORT*  Clinical Data: Dizziness with unsteady gait.  Questionable seizures.  MRI HEAD WITHOUT CONTRAST  Technique:  Multiplanar, multiecho pulse sequences of the brain and surrounding structures were obtained according to standard protocol without intravenous contrast.  Comparison: 08/22/2010 MR and CT.  Findings: Motion degraded exam.  No acute infarct.  No intracranial hemorrhage.  Mild global atrophy without hydrocephalus.  No intracranial mass lesion or seizure focus detected on this unenhanced motion degraded exam.  Major intracranial vascular structures appear grossly patent.  Minimal paranasal sinus mucosal thickening.  IMPRESSION: Motion degraded exam without acute abnormality.  Please see above.  Original Report Authenticated By: Fuller Canada, M.D.   Dg Fluoro Guide Ndl Plc/bx  12/30/2010  *RADIOLOGY REPORT*  Clinical Data:  Altered mental status.  DIAGNOSTIC LUMBAR PUNCTURE UNDER FLUOROSCOPIC GUIDANCE  Fluoroscopy time:  0.28 minutes.  Technique:  Informed consent was obtained from the patient prior to the procedure, including potential complications of headache, allergy, and pain.   With the patient prone, the lower back was prepped with Betadine.  1% Lidocaine was used for local anesthesia. Lumbar puncture was performed at the left paramidline L1-2 level using a 20 gauge needle with return of somewhat bloody CSF.  Good flow could not be established.  I therefore re-punctured  via  a right paramidline approach at L2-3.  Clear CSF was obtained.  The opening pressure was 17.5 cm water, within normal limits.   10.0 ml of CSF were obtained for laboratory studies.  The patient tolerated the procedure well and there were no apparent complications.  IMPRESSION:  1.  Technically successful fluoroscopic guided lumbar puncture via a right paramidline approach at L2-3. 2.  An initial attempts of the left paramidline approach at L1-2 was unsuccessful. 3.  The opening pressure was within normal limits at 17.5 cm water. 4.  10 ml of clear CSF was sent for laboratory examination as ordered.  Original Report Authenticated By: Jamesetta Orleans. MATTERN, M.D.   Medications: I have reviewed the patient's current medications. Scheduled Meds:   . cefTRIAXone (ROCEPHIN)  IV  2 g Intravenous Q24H  . ibuprofen  600 mg Oral Once  . lacosamide  100 mg Oral BID  . lamoTRIgine  200 mg Oral BID  . meclizine  25 mg Oral TID  . moxifloxacin  400 mg Intravenous Q24H  . oseltamivir  75 mg Oral BID  . PARoxetine  20 mg Oral QHS  . potassium chloride  20 mEq Oral Once  . vancomycin  1,000 mg Intravenous Q8H   Continuous Infusions:   . sodium chloride 75 mL/hr at 12/31/10 0800   PRN Meds:.acetaminophen, ibuprofen, DISCONTD: ibuprofen Assessment/Plan: Active Problems:  Seizure - EEG shows no seizure activity on home dose of lamictal and vimpat. Will continue home dose of AED. No seizure activity noted on exam or by nursing staff.  AMS - pt is clearing, will need PT/OT. No evidence for stroke on CT/MRI. CSF viral results still pending and culture pending. Will start a cycle they've given presence of red cells and spinal fluid t ill herpes PCR comes back negative LOS: 2 days   Delia Heady, MD12/18/2012, 9:51 AM

## 2010-12-31 NOTE — Plan of Care (Signed)
Problem: Phase I Progression Outcomes Goal: OOB as tolerated unless otherwise ordered Outcome: Not Progressing Orthostatic with OOB

## 2010-12-31 NOTE — Progress Notes (Signed)
ANTIBIOTIC CONSULT NOTE - FOLLOW UP  Pharmacy Consult for Vancomycin Indication: R/O meningitis  Allergies  Allergen Reactions  . Ultram (Tramadol Hcl) Other (See Comments)    Reaction:Seizures  . Vicodin (Hydrocodone-Acetaminophen) Itching  . Latex Rash  . Penicillins Itching and Rash    Patient Measurements: Height: 5' (152.4 cm) Weight: 175 lb 7.8 oz (79.6 kg) IBW/kg (Calculated) : 45.5   Vital Signs: Temp: 98.1 F (36.7 C) (12/18 1535) Temp src: Oral (12/18 1535) BP: 131/87 mmHg (12/18 1700) Pulse Rate: 87  (12/18 1800) Intake/Output from previous day: 12/17 0701 - 12/18 0700 In: 2260 [P.O.:435; I.V.:1375; IV Piggyback:450] Out: 3325 [Urine:3325] Intake/Output from this shift: Total I/O In: 2229.1 [P.O.:720; I.V.:750; IV Piggyback:759.1] Out: 1020 [Urine:1020]  Labs:  Basename 12/31/10 0420 12/29/10 1800 12/29/10 1758 12/29/10 1229  WBC 7.3 -- 18.5* 19.8*  HGB 10.2* -- 10.7* 11.4*  PLT 242 -- 210 219  LABCREA -- -- -- --  CREATININE 0.60 0.59 -- 0.60   Estimated Creatinine Clearance: 82.9 ml/min (by C-G formula based on Cr of 0.6).  Basename 12/31/10 1710  VANCOTROUGH 14.0  VANCOPEAK --  VANCORANDOM --  GENTTROUGH --  GENTPEAK --  GENTRANDOM --  TOBRATROUGH --  TOBRAPEAK --  TOBRARND --  AMIKACINPEAK --  AMIKACINTROU --  AMIKACIN --     Microbiology: Recent Results (from the past 720 hour(s))  URINE CULTURE     Status: Normal   Collection Time   12/26/10  7:18 PM      Component Value Range Status Comment   Specimen Description URINE, CLEAN CATCH   Final    Special Requests NONE   Final    Setup Time [id number]   Final    Colony Count >=100,000 COLONIES/ML   Final    Culture     Final    Value: Multiple bacterial morphotypes present, none predominant. Suggest appropriate recollection if clinically indicated.   Report Status 12/27/2010 FINAL   Final   CULTURE, BLOOD (ROUTINE X 2)     Status: Normal (Preliminary result)   Collection Time   12/29/10  9:00 PM      Component Value Range Status Comment   Specimen Description BLOOD RIGHT ARM   Final    Special Requests BOTTLES DRAWN AEROBIC AND ANAEROBIC 10CC EA   Final    Setup Time [id number]   Final    Culture     Final    Value:        BLOOD CULTURE RECEIVED NO GROWTH TO DATE CULTURE WILL BE HELD FOR 5 DAYS BEFORE ISSUING A FINAL NEGATIVE REPORT   Report Status PENDING   Incomplete   CULTURE, BLOOD (ROUTINE X 2)     Status: Normal (Preliminary result)   Collection Time   12/29/10  9:11 PM      Component Value Range Status Comment   Specimen Description BLOOD RIGHT HAND   Final    Special Requests BOTTLES DRAWN AEROBIC ONLY 10CC   Final    Setup Time [id number]   Final    Culture     Final    Value:        BLOOD CULTURE RECEIVED NO GROWTH TO DATE CULTURE WILL BE HELD FOR 5 DAYS BEFORE ISSUING A FINAL NEGATIVE REPORT   Report Status PENDING   Incomplete   CSF CULTURE     Status: Normal (Preliminary result)   Collection Time   12/30/10  9:30 AM      Component Value Range Status Comment  Specimen Description CSF   Final    Special Requests 2.0ML   Final    Gram Stain     Final    Value: CYTOSPIN NO WBC SEEN     NO ORGANISMS SEEN   Culture NO GROWTH 1 DAY   Final    Report Status PENDING   Incomplete   MRSA PCR SCREENING     Status: Normal   Collection Time   12/30/10 10:32 AM      Component Value Range Status Comment   MRSA by PCR NEGATIVE  NEGATIVE  Final   URINE CULTURE     Status: Normal   Collection Time   12/30/10  4:51 PM      Component Value Range Status Comment   Specimen Description URINE, CLEAN CATCH   Final    Special Requests NONE   Final    Setup Time [id number]   Final    Colony Count NO GROWTH   Final    Culture NO GROWTH   Final    Report Status 12/31/2010 FINAL   Final     Anti-infectives     Start     Dose/Rate Route Frequency Ordered Stop   01/01/11 0200   vancomycin (VANCOCIN) 1,250 mg in sodium chloride 0.9 % 250 mL IVPB         1,250 mg 166.7 mL/hr over 90 Minutes Intravenous Every 8 hours 12/31/10 1857     12/31/10 1200   acyclovir (ZOVIRAX) 455 mg in dextrose 5 % 100 mL IVPB        10 mg/kg  45.5 kg (Ideal) 109.1 mL/hr over 60 Minutes Intravenous 3 times per day 12/31/10 1121     12/31/10 1200   cefTRIAXone (ROCEPHIN) 2 g in dextrose 5 % 50 mL IVPB        2 g 100 mL/hr over 30 Minutes Intravenous Every 12 hours 12/31/10 1123     12/30/10 1802   oseltamivir (TAMIFLU) capsule 75 mg        75 mg Oral 2 times daily 12/30/10 0903     12/30/10 1000   vancomycin (VANCOCIN) IVPB 1000 mg/200 mL premix  Status:  Discontinued        1,000 mg 200 mL/hr over 60 Minutes Intravenous Every 8 hours 12/30/10 0911 12/31/10 1857   12/30/10 0600   vancomycin (VANCOCIN) IVPB 1000 mg/200 mL premix  Status:  Discontinued        1,000 mg 200 mL/hr over 60 Minutes Intravenous Every 8 hours 12/30/10 0329 12/30/10 0909   12/30/10 0400   oseltamivir (TAMIFLU) capsule 75 mg        75 mg Oral To [facility] 12/30/10 0337 12/30/10 0500   12/30/10 0329   oseltamivir (TAMIFLU) capsule 75 mg  Status:  Discontinued        75 mg Oral Every 12 hours 12/30/10 0329 12/30/10 0902   12/30/10 0329   moxifloxacin (AVELOX) IVPB 400 mg        400 mg 250 mL/hr over 60 Minutes Intravenous Every 24 hours 12/30/10 0329     12/29/10 2200   cefTRIAXone (ROCEPHIN) 2 g in dextrose 5 % 50 mL IVPB        2 g 100 mL/hr over 30 Minutes Intravenous  Once 12/29/10 2142 12/30/10 0009   12/29/10 1930   vancomycin (VANCOCIN) IVPB 1000 mg/200 mL premix        1,000 mg 200 mL/hr over 60 Minutes Intravenous  Once 12/29/10 1920 12/30/10  0250   12/29/10 1915   cefTRIAXone (ROCEPHIN) 2 g in dextrose 5 % 50 mL IVPB  Status:  Discontinued        2 g 100 mL/hr over 30 Minutes Intravenous Every 24 hours 12/29/10 1905 12/31/10 1123          Assessment: 45 YOF on Rocephin/Vancomycin/avelox/acyclovir Day #2 for possible meningitis. Vancomycin trough  slightly subtherapeutic. Patient renal function has been stable.    Goal of Therapy:  Vancomycin trough level 15-20 mcg/ml  Plan:  1. Increase vancomycin dose to 1250 mg IV Q8, which will potentially increase vancomycin trough to ~17 2. F/u renal function and cultures.  3. Recheck vanvomycin trough after 3-5 doses   Riki Rusk 12/31/2010,6:59 PM

## 2010-12-31 NOTE — Progress Notes (Signed)
Physical Therapy Evaluation  PT Assessment/Plan/Recommendation PT Assessment Clinical Impression Statement: Pt s/p seizure with reports of vertigo prior to seizure.  Currently horizontal head roll and Hallpike-Dix tests negative for vertigo or nystagmus (pt has been taking meclizine).  Pt currently requires assist to mobilize safely and will benefit from PT to maximize safety and independence prior to d/c. PT Recommendation/Assessment: Patient will need skilled PT in the acute care venue PT Problem List: Decreased activity tolerance;Decreased balance;Decreased mobility Problem List Comments: vertigo while bathing; educated re: fixating eyes on a stationary target and pt reports this decreased symptoms Barriers to Discharge: Decreased caregiver support PT Therapy Diagnosis : Difficulty walking PT Plan PT Frequency: Min 3X/week PT Treatment/Interventions: DME instruction;Gait training;Stair training;Functional mobility training;Therapeutic activities;Therapeutic exercise;Balance training;Patient/family education;Other (comment) (vestibular rehab, as appropriate) PT Recommendation Recommendations for Other Services: OT consult Follow Up Recommendations: Home health PT Equipment Recommended: None recommended by PT  Patient Details Name: [name] MRN: [id number] DOB: 12-16-1965 Today's Date: 12/31/2010  Problem List:  Patient Active Problem List  Diagnoses  . CONSTIPATION  . ABDOMINAL PAIN-RLQ  . BLOOD IN STOOL, OCCULT  . Seizure  . Suicidal thoughts  . Urine incontinence    Past Medical History:  Past Medical History  Diagnosis Date  . Epilepsy   . Seizures   . Vertigo   . Constipation    Past Surgical History:  Past Surgical History  Procedure Date  . Total hip arthroplasty   . Abdominal hysterectomy   . Colonoscopy   . Joint replacement     PT Goals  Acute Rehab PT Goals PT Goal Formulation: With patient Pt will go Supine/Side to Sit: with modified  independence;with HOB 0 degrees PT Goal: Supine/Side to Sit - Progress: Not met Pt will go Sit to Supine/Side: with modified independence;with HOB 0 degrees PT Goal: Sit to Supine/Side - Progress: Not met Pt will go Sit to Stand: with modified independence;with HOB 0 degrees PT Goal: Sit to Stand - Progress: Not met Pt will go Stand to Sit: with modified independence PT Goal: Stand to Sit - Progress: Not met Pt will Ambulate: 51 - 150 feet;with modified independence;with least restrictive assistive device PT Goal: Ambulate - Progress: Not met Pt will Go Up / Down Stairs: 3-5 stairs;with min assist;with rail(s) PT Goal: Up/Down Stairs - Progress: Not met Pt will Perform Home Exercise Program: with supervision, verbal cues required/provided PT Goal: Perform Home Exercise Program - Progress: Not met Additional Goals Additional Goal #1: Pt perform all activity with vertigo symptoms <2/10 and independently utilize compensatory strategies.  PT Evaluation Precautions/Restrictions  Precautions Precautions: Fall Restrictions Other Position/Activity Restrictions: Pt with symptoms of vertigo--feels like things are spinning or she's on a boat that's rocking Prior Functioning  Home Living Lives With: Spouse Type of Home: House Home Layout: One level Home Access: Stairs to enter Entrance Stairs-Rails: Can reach both;Right;Left Entrance Stairs-Number of Steps: 3 Bathroom Shower/Tub: Tub/shower unit;Curtain Home Adaptive Equipment: Environmental consultant - rolling;Straight cane Prior Function Level of Independence: Independent with homemaking with ambulation Driving: No (due to epilepsy) Cognition Cognition Arousal/Alertness: Awake/alert Overall Cognitive Status: Appears within functional limits for tasks assessed Orientation Level: Oriented X4 Sensation/Coordination Coordination Gross Motor Movements are Fluid and Coordinated: Yes Extremity Assessment RUE Assessment RUE Assessment: Within Functional  Limits LUE Assessment LUE Assessment: Within Functional Limits RLE Assessment RLE Assessment: Within Functional Limits LLE Assessment LLE Assessment: Within Functional Limits Mobility (including Balance) Bed Mobility Bed Mobility: Yes Supine to Sit: 5: Supervision;HOB flat Supine to  Sit Details (indicate cue type and reason): Supervision for safety due to lines and dizziness Sitting - Scoot to Edge of Bed: 5: Supervision Sitting - Scoot to Edge of Bed Details (indicate cue type and reason): Supervision for safety due to lines and dizziness Sit to Supine - Left: 5: Supervision;HOB flat Sit to Supine - Left Details (indicate cue type and reason): Supervision for safety due to lines and dizziness Scooting to [facility]: 5: Supervision Scooting to [facility] Details (indicate cue type and reason): Supervision for safety due to lines and dizziness Transfers Transfers: Yes Sit to Stand: 4: Min assist;With upper extremity assist;With armrests;From bed;From chair/3-in-1 Sit to Stand Details (indicate cue type and reason): min-guard assist for safety due to dizziness and reports feels weak Stand to Sit: 4: Min assist;With upper extremity assist;With armrests;To bed;To chair/3-in-1 Stand to Sit Details: min-guard assist for safety due to dizziness and reports feels weak Ambulation/Gait Ambulation/Gait: Yes Ambulation/Gait Assistance: 4: Min assist Ambulation/Gait Assistance Details (indicate cue type and reason): pt unsteady but with no overt LOB; legs are "shakey" Ambulation Distance (Feet): 7 Feet (seated rest then 3 ft back to bed) Assistive device: 1 person hand held assist Gait Pattern: Step-through pattern;Decreased step length - right;Decreased step length - left  Posture/Postural Control Posture/Postural Control: No significant limitations Exercise    End of Session PT - End of Session Equipment Utilized During Treatment: Gait belt Activity Tolerance: Treatment limited secondary to medical  complications (Comment) (dizziness and orthostasis) Patient left: in bed;with call bell in reach Nurse Communication: Mobility status for transfers;Mobility status for ambulation;Other (comment) (orthostasis and vertigo) General Behavior During Session: [facility] for tasks performed  Myran Arcia 12/31/2010, 11:43 AM

## 2010-12-31 NOTE — Progress Notes (Signed)
Subjective: Alert, still with complaints of vertigo.  Objective: Vital signs in last 24 hours: Temp:  [97.1 F (36.2 [name])-98.9 F (37.2 [name])] 98.1 F (36.7 [name]) (12/18 1535) Pulse Rate:  [77-105] 87  (12/18 1800) Resp:  [16-27] 18  (12/18 1800) BP: (95-144)/(56-93) 131/87 mmHg (12/18 1700) SpO2:  [89 %-99 %] 99 % (12/18 1800) Weight:  [79.6 kg (175 lb 7.8 oz)] 175 lb 7.8 oz (79.6 kg) (12/18 0400) Last BM Date: 12/29/10 Intake/Output from previous day: 12/17 0701 - 12/18 0700 In: 2260 [P.O.:435; I.V.:1375; IV Piggyback:450] Out: 3325 [Urine:3325] Intake/Output this shift: Total I/O In: 2229.1 [P.O.:720; I.V.:750; IV Piggyback:759.1] Out: 1020 [Urine:1020]    General Appearance:   alert, cooperative, no distress  Lungs:     Clear to auscultation bilaterally, respirations unlabored   Heart:    Regular rate and rhythm, S1 and S2 normal, no murmur, rub   or gallop  Abdomen:     Soft, non-tender, bowel sounds active all four quadrants,    no masses, no organomegaly  Extremities:   Extremities normal, atraumatic, no cyanosis or edema  Neurologic:   CNII-XII intact, strength 5 out of 5 and symmetric      Weight change: 1.789 kg (3 lb 15.1 oz)  Intake/Output Summary (Last 24 hours) at 12/31/10 1831 Last data filed at 12/31/10 1748  Gross per 24 hour  Intake 3369.1 ml  Output   3220 ml  Net  149.1 ml    Lab Results:   Basename 12/31/10 0420 12/29/10 1800 12/29/10 1229  NA 140 -- 141  K 3.3* -- 3.0*  CL 107 -- 104  CO2 25 -- 27  GLUCOSE 83 -- 109*  BUN 10 -- 5*  CREATININE 0.60 0.59 --  CALCIUM 8.3* -- 8.7    Basename 12/31/10 0420 12/29/10 1758  WBC 7.3 18.5*  HGB 10.2* 10.7*  HCT 30.9* 31.3*  PLT 242 210  MCV 89.0 88.2   PT/INR No results found for this basename: LABPROT:2,INR:2 in the last 72 hours ABG  Basename 12/29/10 2313  PHART 7.457*  HCO3 24.8*   Micro Results: Recent Results (from the past 240 hour(s))  URINE CULTURE     Status: Normal   Collection Time   12/26/10  7:18 PM      Component Value Range Status Comment   Specimen Description URINE, CLEAN CATCH   Final    Special Requests NONE   Final    Setup Time [id number]   Final    Colony Count >=100,000 COLONIES/ML   Final    Culture     Final    Value: Multiple bacterial morphotypes present, none predominant. Suggest appropriate recollection if clinically indicated.   Report Status 12/27/2010 FINAL   Final   CULTURE, BLOOD (ROUTINE X 2)     Status: Normal (Preliminary result)   Collection Time   12/29/10  9:00 PM      Component Value Range Status Comment   Specimen Description BLOOD RIGHT ARM   Final    Special Requests BOTTLES DRAWN AEROBIC AND ANAEROBIC 10CC EA   Final    Setup Time [id number]   Final    Culture     Final    Value:        BLOOD CULTURE RECEIVED NO GROWTH TO DATE CULTURE WILL BE HELD FOR 5 DAYS BEFORE ISSUING A FINAL NEGATIVE REPORT   Report Status PENDING   Incomplete   CULTURE, BLOOD (ROUTINE X 2)     Status: Normal (Preliminary  result)   Collection Time   12/29/10  9:11 PM      Component Value Range Status Comment   Specimen Description BLOOD RIGHT HAND   Final    Special Requests BOTTLES DRAWN AEROBIC ONLY 10CC   Final    Setup Time [id number]   Final    Culture     Final    Value:        BLOOD CULTURE RECEIVED NO GROWTH TO DATE CULTURE WILL BE HELD FOR 5 DAYS BEFORE ISSUING A FINAL NEGATIVE REPORT   Report Status PENDING   Incomplete   CSF CULTURE     Status: Normal (Preliminary result)   Collection Time   12/30/10  9:30 AM      Component Value Range Status Comment   Specimen Description CSF   Final    Special Requests 2.0ML   Final    Gram Stain     Final    Value: CYTOSPIN NO WBC SEEN     NO ORGANISMS SEEN   Culture NO GROWTH 1 DAY   Final    Report Status PENDING   Incomplete   MRSA PCR SCREENING     Status: Normal   Collection Time   12/30/10 10:32 AM      Component Value Range Status Comment   MRSA by PCR NEGATIVE   NEGATIVE  Final    Tube # 3 Color, CSF COLORLESS COLORLESS Appearance, CSF CLEAR CLEAR Supernatant NOT INDICATED RBC Count, CSF 96 (H) 0 /cu mm WBC, CSF 1 0 - 5 /cu mm Segmented Neutrophils-CSF RARE 0 - 6 % Lymphs, CSF RARE 40 - 80 % Monocyte-Macrophage-Spinal Fluid RARE 15 - 45 % Other Cells, CSF TOO FEW TO COUNT, SMEAR AVAILABLE FOR REVIEW    Studies/Results: Ct Angio Head W/cm &/or Wo Cm  12/29/2010  *RADIOLOGY REPORT*  Clinical Data:  History of epilepsy.  Dizziness.  Altered level of consciousness.  Headaches.  Question basilar stenosis?  CT ANGIOGRAPHY HEAD AND NECK  Technique:  Multidetector CT imaging of the head and neck was performed using the standard protocol during bolus administration of intravenous contrast.  Multiplanar CT image reconstructions including MIPs were obtained to evaluate the vascular anatomy. Carotid stenosis measurements (when applicable) are obtained utilizing NASCET criteria, using the distal internal carotid diameter as the denominator.  Contrast: 50mL OMNIPAQUE IOHEXOL 350 MG/ML IV SOLN  Comparison:  12/29/2010 MR brain.  CTA NECK  Findings:  Fatty replacement of the right parotid gland.  Tiny calcifications with intraparotid lymph nodes/masses left parotid gland.  Increased number of normal to enlarged lymph nodes throughout the neck.  As an index lymph node, left level II lymph node measures 1.3 x 0.8 x 1.9 cm.  Although these lymph nodes may be reactive origin, tumor (lymphoma/head and neck) not excluded.  No evidence of carotid artery stenosis or dissection.  Ectatic distal vertical cervical segment of the internal carotid artery just proximal to the skull base bilaterally without pleated appearance as may be seen with fibromuscular dysplasia.  Evaluation of the proximal aspect of the right vertebral artery is limited by streak artery from injected contrast.  No obvious narrowing or dissection of either vertebral artery.  Cervical spondylotic changes most prominent  C6-7.   Review of the MIP images confirms the above findings.  IMPRESSION: No evidence of carotid artery stenosis or dissection.  Ectatic distal vertical cervical segment of the internal carotid artery just proximal to the skull base bilaterally without pleated appearance as may be  seen with fibromuscular dysplasia.  Evaluation of the proximal aspect of the right vertebral artery is limited by streak artery from injected contrast.  No obvious narrowing or dissection of either vertebral artery.  Fatty replacement of the right parotid gland.  Tiny calcifications with intraparotid lymph nodes/masses left parotid gland.  Increased number of normal to enlarged lymph nodes throughout the neck.  As an index lymph node, left level II lymph node measures 1.3 x 0.8 x 1.9 cm.  Although these lymph nodes may be reactive origin, tumor (lymphoma/head and neck) not excluded.  CTA HEAD  Findings:  No intracranial hemorrhage.  No CT evidence of large acute infarct.  No intracranial enhancing lesion.  No hydrocephalus.  Previously questioned atrophy not as apparent on the present exam.  No significant stenosis of the codominant vertebral arteries. PICAs visualized.  Mild irregularity of the basilar artery without high-grade stenosis.  Both AICAs visualized.  No significant narrowing of the superior cerebellar arteries or posterior cerebral arteries.  Anterior circulation without to medium or large size vessel narrowing or occlusion. Mild calcification cavernous segment of the internal carotid artery bilaterally.  No aneurysm detected.  Mucosal thickening maxillary sinuses and ethmoid sinus air cells.   Review of the MIP images confirms the above findings.  IMPRESSION: No intracranial medium or large size vessel significant stenosis or occlusion as detailed above.  Original Report Authenticated By: Fuller Canada, M.D.   Ct Angio Neck W/cm &/or Wo/cm  12/29/2010  *RADIOLOGY REPORT*  Clinical Data:  History of epilepsy.   Dizziness.  Altered level of consciousness.  Headaches.  Question basilar stenosis?  CT ANGIOGRAPHY HEAD AND NECK  Technique:  Multidetector CT imaging of the head and neck was performed using the standard protocol during bolus administration of intravenous contrast.  Multiplanar CT image reconstructions including MIPs were obtained to evaluate the vascular anatomy. Carotid stenosis measurements (when applicable) are obtained utilizing NASCET criteria, using the distal internal carotid diameter as the denominator.  Contrast: 50mL OMNIPAQUE IOHEXOL 350 MG/ML IV SOLN  Comparison:  12/29/2010 MR brain.  CTA NECK  Findings:  Fatty replacement of the right parotid gland.  Tiny calcifications with intraparotid lymph nodes/masses left parotid gland.  Increased number of normal to enlarged lymph nodes throughout the neck.  As an index lymph node, left level II lymph node measures 1.3 x 0.8 x 1.9 cm.  Although these lymph nodes may be reactive origin, tumor (lymphoma/head and neck) not excluded.  No evidence of carotid artery stenosis or dissection.  Ectatic distal vertical cervical segment of the internal carotid artery just proximal to the skull base bilaterally without pleated appearance as may be seen with fibromuscular dysplasia.  Evaluation of the proximal aspect of the right vertebral artery is limited by streak artery from injected contrast.  No obvious narrowing or dissection of either vertebral artery.  Cervical spondylotic changes most prominent C6-7.   Review of the MIP images confirms the above findings.  IMPRESSION: No evidence of carotid artery stenosis or dissection.  Ectatic distal vertical cervical segment of the internal carotid artery just proximal to the skull base bilaterally without pleated appearance as may be seen with fibromuscular dysplasia.  Evaluation of the proximal aspect of the right vertebral artery is limited by streak artery from injected contrast.  No obvious narrowing or dissection of  either vertebral artery.  Fatty replacement of the right parotid gland.  Tiny calcifications with intraparotid lymph nodes/masses left parotid gland.  Increased number of normal to enlarged lymph  nodes throughout the neck.  As an index lymph node, left level II lymph node measures 1.3 x 0.8 x 1.9 cm.  Although these lymph nodes may be reactive origin, tumor (lymphoma/head and neck) not excluded.  CTA HEAD  Findings:  No intracranial hemorrhage.  No CT evidence of large acute infarct.  No intracranial enhancing lesion.  No hydrocephalus.  Previously questioned atrophy not as apparent on the present exam.  No significant stenosis of the codominant vertebral arteries. PICAs visualized.  Mild irregularity of the basilar artery without high-grade stenosis.  Both AICAs visualized.  No significant narrowing of the superior cerebellar arteries or posterior cerebral arteries.  Anterior circulation without to medium or large size vessel narrowing or occlusion. Mild calcification cavernous segment of the internal carotid artery bilaterally.  No aneurysm detected.  Mucosal thickening maxillary sinuses and ethmoid sinus air cells.   Review of the MIP images confirms the above findings.  IMPRESSION: No intracranial medium or large size vessel significant stenosis or occlusion as detailed above.  Original Report Authenticated By: Fuller Canada, M.D.   Dg Fluoro Guide Ndl Plc/bx  12/30/2010  *RADIOLOGY REPORT*  Clinical Data:  Altered mental status.  DIAGNOSTIC LUMBAR PUNCTURE UNDER FLUOROSCOPIC GUIDANCE  Fluoroscopy time:  0.28 minutes.  Technique:  Informed consent was obtained from the patient prior to the procedure, including potential complications of headache, allergy, and pain.   With the patient prone, the lower back was prepped with Betadine.  1% Lidocaine was used for local anesthesia. Lumbar puncture was performed at the left paramidline L1-2 level using a 20 gauge needle with return of somewhat bloody CSF.  Good  flow could not be established.  I therefore re-punctured via a right paramidline approach at L2-3.  Clear CSF was obtained.  The opening pressure was 17.5 cm water, within normal limits.   10.0 ml of CSF were obtained for laboratory studies.  The patient tolerated the procedure well and there were no apparent complications.  IMPRESSION:  1.  Technically successful fluoroscopic guided lumbar puncture via a right paramidline approach at L2-3. 2.  An initial attempts of the left paramidline approach at L1-2 was unsuccessful. 3.  The opening pressure was within normal limits at 17.5 cm water. 4.  10 ml of clear CSF was sent for laboratory examination as ordered.  Original Report Authenticated By: Jamesetta Orleans. MATTERN, M.D.   Medications:  Scheduled Meds:    . acyclovir  10 mg/kg (Ideal) Intravenous Q8H  . cefTRIAXone (ROCEPHIN)  IV  2 g Intravenous Q12H  . enoxaparin (LOVENOX) injection  40 mg Subcutaneous Q24H  . ibuprofen  600 mg Oral Once  . lacosamide  100 mg Oral BID  . lamoTRIgine  200 mg Oral BID  . meclizine  25 mg Oral TID  . moxifloxacin  400 mg Intravenous Q24H  . oseltamivir  75 mg Oral BID  . PARoxetine  20 mg Oral QHS  . vancomycin  1,000 mg Intravenous Q8H  . DISCONTD: cefTRIAXone (ROCEPHIN)  IV  2 g Intravenous Q24H   Continuous Infusions:    . sodium chloride 75 mL/hr at 12/31/10 1700   PRN Meds:.acetaminophen, ibuprofen, DISCONTD: ibuprofen Assessment/Plan: Patient Active Hospital Problem List: 1.Seizure (12/29/2010)- Continue outpatient meds, EEG with no epileptic activity. 2.SIRS- patient with tachycardia, and leukocytosis, and altered mental status on admission arm, status post LP- on empiric antibiotics to cover for possible meningitis, follow Gram stain and cultures. 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 3- tachy cardia: likely underlying infection and dehydration,septic work up in progress- procalcitonin levels  within normal limits.  4- leukocytosis: Resolved on antibiotics as above. 5- hypokalemia-replace the potassium.   6-Vertigo, positional -CT angiogram of head with no evidence of acute infarct, continue Antivert .  LOS: 2 days   [name] 12/31/2010, 6:31 PM

## 2011-01-01 LAB — CBC
HCT: 30.1 % — ABNORMAL LOW (ref 36.0–46.0)
Hemoglobin: 10.1 g/dL — ABNORMAL LOW (ref 12.0–15.0)
MCH: 29.5 pg (ref 26.0–34.0)
MCHC: 33.6 g/dL (ref 30.0–36.0)
MCV: 88 fL (ref 78.0–100.0)
Platelets: 260 10*3/uL (ref 150–400)
RBC: 3.42 MIL/uL — ABNORMAL LOW (ref 3.87–5.11)
RDW: 13.2 % (ref 11.5–15.5)
WBC: 7.8 10*3/uL (ref 4.0–10.5)

## 2011-01-01 LAB — BASIC METABOLIC PANEL
BUN: 7 mg/dL (ref 6–23)
CO2: 22 mEq/L (ref 19–32)
Calcium: 8.3 mg/dL — ABNORMAL LOW (ref 8.4–10.5)
Chloride: 110 mEq/L (ref 96–112)
Creatinine, Ser: 0.6 mg/dL (ref 0.50–1.10)
GFR calc Af Amer: >90 mL/min (ref >90)
GFR calc non Af Amer: >90 mL/min (ref >90)
Glucose, Bld: 85 mg/dL (ref 70–99)
Potassium: 3.7 mEq/L (ref 3.5–5.1)
Sodium: 143 mEq/L (ref 135–145)

## 2011-01-01 LAB — INFLUENZA PANEL BY PCR (TYPE A & B)
H1N1 flu by pcr: NOT DETECTED
Influenza A By PCR: POSITIVE — AB
Influenza B By PCR: NEGATIVE

## 2011-01-01 LAB — VDRL, CSF: VDRL Quant, CSF: NONREACTIVE

## 2011-01-01 LAB — VANCOMYCIN, TROUGH: Vancomycin Tr: 10.4 ug/mL (ref 10.0–20.0)

## 2011-01-01 MED ORDER — ONDANSETRON HCL 4 MG/2ML IJ SOLN
4.0000 mg | Freq: Four times a day (QID) | INTRAMUSCULAR | Status: DC | PRN
Start: 2011-01-01 — End: 2011-01-02
  Administered 2011-01-01 – 2011-01-02 (×2): 4 mg via INTRAVENOUS
  Filled 2011-01-01 (×2): qty 2

## 2011-01-01 NOTE — Progress Notes (Signed)
Subjective: Patient reports no complaints today.  She reports that she is walking better and per report of nursing staff was able to walk to the bathroom without assistance.  Is still not back to baseline though.  Objective: Vital signs in last 24 hours: Temp:  [97.1 F (36.2 C)-99.1 F (37.3 C)] 98.3 F (36.8 C) (12/19 0700) Pulse Rate:  [79-112] 85  (12/19 0800) Resp:  [18-26] 21  (12/19 0800) BP: (107-144)/(67-93) 126/83 mmHg (12/19 0800) SpO2:  [92 %-100 %] 99 % (12/19 0800) Weight:  [77.1 kg (169 lb 15.6 oz)] 169 lb 15.6 oz (77.1 kg) (12/19 0700)  Intake/Output from previous day: 12/18 0701 - 12/19 0700 In: 2979.1 [P.O.:720; I.V.:1500; IV Piggyback:759.1] Out: 3120 [Urine:3120] Intake/Output this shift: Total I/O In: 150 [I.V.:150] Out: -  Nutritional status: General  Neurologic Exam: Mental Status: Alert, oriented, thought content appropriate.  Speech fluent without evidence of aphasia.  Able to follow 3 step commands without difficulty. Cranial Nerves: II: visual fields grossly normal, pupils equal, round, reactive to light and accommodation III,IV, VI: ptosis not present, extra-ocular motions intact bilaterally V,VII: smile symmetric, facial light touch sensation normal bilaterally VIII: hearing normal bilaterally IX,X: gag reflex present XI: trapezius strength/neck flexion strength normal bilaterally XII: tongue strength normal  Motor: Right : Upper extremity   5/5    Left:     Upper extremity   5/5  Lower extremity   5/5     Lower extremity   5/5 Tone and bulk:normal tone throughout; no atrophy noted Sensory: Pinprick and light touch intact throughout, bilaterally Deep Tendon Reflexes: 2+ and symmetric throughout Plantars: Right: downgoing   Left: downgoing Cerebellar: Normal finger-to-nose bilaterally.  Mild difficulty with heel-to-shin testing on the left, intact on the right.  Lab Results:  [facility] 01/01/11 0415 12/31/10 0420  WBC 7.8 7.3  HGB 10.1*  10.2*  HCT 30.1* 30.9*  PLT 260 242  NA 143 140  K 3.7 3.3*  CL 110 107  CO2 22 25  GLUCOSE 85 83  BUN 7 10  CREATININE 0.60 0.60  CALCIUM 8.3* 8.3*  LABA1C -- --    Studies/Results: Dg Fluoro Guide Ndl Plc/bx  12/30/2010  *RADIOLOGY REPORT*  Clinical Data:  Altered mental status.  DIAGNOSTIC LUMBAR PUNCTURE UNDER FLUOROSCOPIC GUIDANCE  Fluoroscopy time:  0.28 minutes.  Technique:  Informed consent was obtained from the patient prior to the procedure, including potential complications of headache, allergy, and pain.   With the patient prone, the lower back was prepped with Betadine.  1% Lidocaine was used for local anesthesia. Lumbar puncture was performed at the left paramidline L1-2 level using a 20 gauge needle with return of somewhat bloody CSF.  Good flow could not be established.  I therefore re-punctured via a right paramidline approach at L2-3.  Clear CSF was obtained.  The opening pressure was 17.5 cm water, within normal limits.   10.0 ml of CSF were obtained for laboratory studies.  The patient tolerated the procedure well and there were no apparent complications.  IMPRESSION:  1.  Technically successful fluoroscopic guided lumbar puncture via a right paramidline approach at L2-3. 2.  An initial attempts of the left paramidline approach at L1-2 was unsuccessful. 3.  The opening pressure was within normal limits at 17.5 cm water. 4.  10 ml of clear CSF was sent for laboratory examination as ordered.  Original Report Authenticated By: Jamesetta Orleans. MATTERN, M.D.    Medications:  I have reviewed the patient's current medications. Scheduled:   .  acyclovir  10 mg/kg (Ideal) Intravenous Q8H  . cefTRIAXone (ROCEPHIN)  IV  2 g Intravenous Q12H  . enoxaparin (LOVENOX) injection  40 mg Subcutaneous Q24H  . lacosamide  100 mg Oral BID  . lamoTRIgine  200 mg Oral BID  . meclizine  25 mg Oral TID  . moxifloxacin  400 mg Intravenous Q24H  . oseltamivir  75 mg Oral BID  . PARoxetine  20  mg Oral QHS  . potassium chloride  40 mEq Oral Q4H  . vancomycin  1,250 mg Intravenous Q8H  . white petrolatum      . DISCONTD: cefTRIAXone (ROCEPHIN)  IV  2 g Intravenous Q24H  . DISCONTD: vancomycin  1,000 mg Intravenous Q8H    Assessment/Plan:  Patient Active Hospital Problem List: Seizure (12/29/2010)   Assessment: No seizures noted per hospital staff.     Plan: Continue home AED's Altered Mental Status   Assessment: Patient continues to improve.  HSV pcr negative and although with red cells on LP patient does not likely have a herpes encephalitis. Will not continue to treat with acyclovir.   Plan:  1.  D/C acyclovir    LOS: 3 days   Thana Farr, MD [facility] [phone] 01/01/2011  8:31 AM

## 2011-01-01 NOTE — Progress Notes (Addendum)
Subjective: I have reviewed [name]'s chart at length.  She is resting comfortably at present.  She denies f/c, sob, n/v, abdom pain, chest pain, or even HA.  Objective: Weight change: -1.8 kg (-3 lb 15.5 oz)  Intake/Output Summary (Last 24 hours) at 01/01/11 1615 Last data filed at 01/01/11 1600  Gross per 24 hour  Intake   2245 ml  Output   2725 ml  Net   -480 ml   Blood pressure 126/84, pulse 87, temperature 98.3 F (36.8 C), temperature source Oral, resp. rate 24, height 5' (1.524 m), weight 77.1 kg (169 lb 15.6 oz), SpO2 98.00%.  Physical Exam: General: No acute respiratory distress Lungs: Clear to auscultation bilaterally without wheezes or crackles Cardiovascular: Regular rate and rhythm without murmur gallop or rub normal S1 and S2 Abdomen: Nontender, nondistended, soft, bowel sounds positive, no rebound, no ascites, no appreciable mass Extremities: No significant cyanosis, clubbing, or edema bilateral lower extremities  Lab Results:  [facility] 01/01/11 0415 12/31/10 0420 12/29/10 1800  NA 143 140 --  K 3.7 3.3* --  CL 110 107 --  CO2 22 25 --  GLUCOSE 85 83 --  BUN 7 10 --  CREATININE 0.60 0.60 0.59  CALCIUM 8.3* 8.3* --  MG -- -- --  PHOS -- -- --    Basename 01/01/11 0415 12/31/10 0420 12/29/10 1758  WBC 7.8 7.3 18.5*  NEUTROABS -- -- 14.9*  HGB 10.1* 10.2* 10.7*  HCT 30.1* 30.9* 31.3*  MCV 88.0 89.0 88.2  PLT 260 242 210    Basename 12/30/10 1607 12/30/10 0749 12/30/10 0350  CKTOTAL 295* 341* 363*  CKMB 3.3 3.5 3.5  CKMBINDEX -- -- --  TROPONINI <0.30 <0.30 <0.30   Micro Results: Recent Results (from the past 240 hour(s))  URINE CULTURE     Status: Normal   Collection Time   12/26/10  7:18 PM      Component Value Range Status Comment   Specimen Description URINE, CLEAN CATCH   Final    Special Requests NONE   Final    Setup Time [id number]   Final    Colony Count >=100,000 COLONIES/ML   Final    Culture     Final    Value: Multiple  bacterial morphotypes present, none predominant. Suggest appropriate recollection if clinically indicated.   Report Status 12/27/2010 FINAL   Final   CULTURE, BLOOD (ROUTINE X 2)     Status: Normal (Preliminary result)   Collection Time   12/29/10  9:00 PM      Component Value Range Status Comment   Specimen Description BLOOD RIGHT ARM   Final    Special Requests BOTTLES DRAWN AEROBIC AND ANAEROBIC 10CC EA   Final    Setup Time [id number]   Final    Culture     Final    Value:        BLOOD CULTURE RECEIVED NO GROWTH TO DATE CULTURE WILL BE HELD FOR 5 DAYS BEFORE ISSUING A FINAL NEGATIVE REPORT   Report Status PENDING   Incomplete   CULTURE, BLOOD (ROUTINE X 2)     Status: Normal (Preliminary result)   Collection Time   12/29/10  9:11 PM      Component Value Range Status Comment   Specimen Description BLOOD RIGHT HAND   Final    Special Requests BOTTLES DRAWN AEROBIC ONLY 10CC   Final    Setup Time [id number]   Final    Culture     Final  Value:        BLOOD CULTURE RECEIVED NO GROWTH TO DATE CULTURE WILL BE HELD FOR 5 DAYS BEFORE ISSUING A FINAL NEGATIVE REPORT   Report Status PENDING   Incomplete   CSF CULTURE     Status: Normal (Preliminary result)   Collection Time   12/30/10  9:30 AM      Component Value Range Status Comment   Specimen Description CSF   Final    Special Requests 2.0ML   Final    Gram Stain     Final    Value: CYTOSPIN NO WBC SEEN     NO ORGANISMS SEEN   Culture NO GROWTH 2 DAYS   Final    Report Status PENDING   Incomplete   MRSA PCR SCREENING     Status: Normal   Collection Time   12/30/10 10:32 AM      Component Value Range Status Comment   MRSA by PCR NEGATIVE  NEGATIVE  Final   URINE CULTURE     Status: Normal   Collection Time   12/30/10  4:51 PM      Component Value Range Status Comment   Specimen Description URINE, CLEAN CATCH   Final    Special Requests NONE   Final    Setup Time [id number]   Final    Colony Count NO GROWTH   Final     Culture NO GROWTH   Final    Report Status 12/31/2010 FINAL   Final    Assessment/Plan:  Seizure w/ hx of seizure d/o  Neuro is following-no recurrent seizure activity   ?SIRS  No evidence of active infection-clinically stable  Tachycardia  Resolved  Leukocytosis  Resolved-no evidence to suggest meningitis-will d/c abx tx and follow clinical course  Hypokalemia  Resolved  Positional vertigo Sx have resolved per pt report  Dispo Stable for transfer to unit 3000 - possible home in next 24-48hrs   LOS: 3 days  01/01/2011, 4:15 PM  Lonia Blood, MD [facility] Office  [phone] Pager [phone]  On-Call/Text Page:      [email or web address]      password [place]

## 2011-01-02 ENCOUNTER — Inpatient Hospital Stay (HOSPITAL_COMMUNITY): Payer: Medicare Other

## 2011-01-02 DIAGNOSIS — H8110 Benign paroxysmal vertigo, unspecified ear: Secondary | ICD-10-CM | POA: Diagnosis present

## 2011-01-02 DIAGNOSIS — J101 Influenza due to other identified influenza virus with other respiratory manifestations: Secondary | ICD-10-CM | POA: Diagnosis present

## 2011-01-02 LAB — CSF CULTURE W GRAM STAIN: Gram Stain: NONE SEEN

## 2011-01-02 LAB — CSF CULTURE: Culture: NO GROWTH

## 2011-01-02 MED ORDER — DIPHENHYDRAMINE-ZINC ACETATE 2-0.1 % EX CREA: TOPICAL_CREAM | Freq: Two times a day (BID) | CUTANEOUS | Status: DC | PRN

## 2011-01-02 MED ORDER — DIPHENHYDRAMINE-ZINC ACETATE 2-0.1 % EX CREA
TOPICAL_CREAM | Freq: Two times a day (BID) | CUTANEOUS | Status: DC | PRN
  Filled 2011-01-02: qty 28.4

## 2011-01-02 MED ORDER — OSELTAMIVIR PHOSPHATE 75 MG PO CAPS: 75.0000 mg | ORAL_CAPSULE | Freq: Two times a day (BID) | ORAL | Status: AC

## 2011-01-02 NOTE — Progress Notes (Signed)
Physical Therapy Treatment PT Discharge Note  Patient is being discharged from PT services secondary to:   Goals met and no further therapy needs identified.  Please see latest Therapy Progress Note (below) for current level of functioning and progress toward goals. Progress and discharge plan and discussed with patient/caregiver and they   Agree    Patient Details Name: [name] MRN: [id number] DOB: 1965-03-06 Today's Date: 01/02/2011  PT Assessment/Plan  PT - Assessment/Plan Comments on Treatment Session: Pt much better with no balance deficits noted.  Pt agreeable with d/c from PT PT Plan: Discharge plan needs to be updated;All goals met and education completed, patient dischaged from PT services Follow Up Recommendations: None Equipment Recommended: None recommended by PT PT Goals  Acute Rehab PT Goals PT Goal: Supine/Side to Sit - Progress: Met PT Goal: Sit to Supine/Side - Progress: Met PT Goal: Sit to Stand - Progress: Met PT Goal: Stand to Sit - Progress: Met PT Goal: Ambulate - Progress: Met PT Goal: Up/Down Stairs - Progress: Discontinued (comment) (pt on droplet precautions--stairs deferred, pt doing well) PT Goal: Perform Home Exercise Program - Progress: Not met Additional Goals PT Goal: Additional Goal #1 - Progress: Met  PT Treatment Precautions/Restrictions  Precautions Precautions: Other (comment) (none) Restrictions Weight Bearing Restrictions: No Other Position/Activity Restrictions: Vertigo symptoms resolved Mobility (including Balance) Bed Mobility Supine to Sit: 7: Independent;HOB flat Sitting - Scoot to Edge of Bed: 7: Independent Sit to Supine - Left: 7: Independent Transfers Sit to Stand: 7: Independent;From bed;Without upper extremity assist Stand to Sit: 7: Independent;To bed;Without upper extremity assist Ambulation/Gait Ambulation/Gait Assistance: 7: Independent Ambulation/Gait Assistance Details (indicate cue type and reason):  initially supervision for safety--progressed to I Ambulation Distance (Feet): 50 Feet Assistive device: None Gait Pattern: Within Functional Limits Stairs: No  Berg Balance Test Sit to Stand: Able to stand without using hands and stabilize independently Standing Unsupported: Able to stand safely 2 minutes Sitting with Back Unsupported but Feet Supported on Floor or Stool: Able to sit safely and securely 2 minutes Stand to Sit: Sits safely with minimal use of hands Transfers: Able to transfer safely, minor use of hands Standing Unsupported with Eyes Closed: Able to stand 10 seconds safely Standing Ubsupported with Feet Together: Able to place feet together independently and stand 1 minute safely From Standing, Reach Forward with Outstretched Arm: Can reach confidently >25 cm (10") From Standing Position, Pick up Object from Floor: Able to pick up shoe safely and easily From Standing Position, Turn to Look Behind Over each Shoulder: Looks behind from both sides and weight shifts well Turn 360 Degrees: Able to turn 360 degrees safely in 4 seconds or less Standing Unsupported, One Foot in Front: Able to plae foot ahead of the other independently and hold 30 seconds Standing on One Leg: Able to lift leg independently and hold equal to or more than 3 seconds Exercise    End of Session PT - End of Session Activity Tolerance: Patient tolerated treatment well Patient left: in bed;with call bell in reach Nurse Communication: Mobility status for ambulation General Behavior During Session: [facility] for tasks performed Cognition: [facility] for tasks performed  Tresean Mattix 01/02/2011, 11:01 AM Pager [phone]

## 2011-01-02 NOTE — Progress Notes (Signed)
Pt found asleep on the floor at 0305.  At 0300 pt was helped to the bathroom after calling out with call bell, and was helped to return to bed after urinating.  On the floor, pt had a pillow under her head and was partially covered with a sheet, both of which had been on the chair next to where she lay.  Pt was still on the monitor.  Pt had not called out.  Both bedrails were up on the side she exited the bed.  After returning to bed, she reported no pain other than her chronic hip pain.  She could not recall having gotten out of bed.  GCS score 15 and neuro intact after returning to bed  Bed alarm turned on after the incident.

## 2011-01-02 NOTE — Progress Notes (Signed)
Pt discharge instructions provided. Pt verbalized understanding. Pt i/v d/c intact. Pt  Under no s/s distress.

## 2011-01-02 NOTE — Discharge Summary (Signed)
DISCHARGE SUMMARY  [name]  MR#: [id number]  DOB:1965/05/04  Date of Admission: 12/29/2010 Date of Discharge: 01/02/2011  Attending Physician:Arno Cullers  Patient's WUJ:WJXBJ, Theressa Millard, OTR, OTR  Consults:Treatment Team:  Kym Groom- Dr Thad Ranger  Presenting Complaint: Confusion/ gait imbalance  Discharge Diagnoses: Active Problems:  Seizure  Influenza A  Vertigo, benign positional    Discharge Medications: Current Discharge Medication List    CONTINUE these medications which have CHANGED   Details  oseltamivir (TAMIFLU) 75 MG capsule Take 1 capsule (75 mg total) by mouth every 12 (twelve) hours. Qty: 3 capsule, Refills: 0      CONTINUE these medications which have NOT CHANGED   Details  clonazePAM (KLONOPIN) 0.5 MG tablet Take 0.5 mg by mouth at bedtime.      Lacosamide (VIMPAT) 100 MG TABS Take 1 tablet by mouth 2 (two) times daily.      lamoTRIgine (LAMICTAL) 200 MG tablet Take 200 mg by mouth 2 (two) times daily.      PARoxetine (PAXIL) 20 MG tablet Take 20 mg by mouth at bedtime.           Procedures: Ct Angio Head W/cm &/or Wo Cm  12/29/2010  *RADIOLOGY REPORT*  Clinical Data:  History of epilepsy.  Dizziness.  Altered level of consciousness.  Headaches.  Question basilar stenosis?  CT ANGIOGRAPHY HEAD AND NECK  Technique:  Multidetector CT imaging of the head and neck was performed using the standard protocol during bolus administration of intravenous contrast.  Multiplanar CT image reconstructions including MIPs were obtained to evaluate the vascular anatomy. Carotid stenosis measurements (when applicable) are obtained utilizing NASCET criteria, using the distal internal carotid diameter as the denominator.  Contrast: 50mL OMNIPAQUE IOHEXOL 350 MG/ML IV SOLN  Comparison:  12/29/2010 MR brain.  CTA NECK  Findings:  Fatty replacement of the right parotid gland.  Tiny calcifications with intraparotid lymph nodes/masses left parotid gland.   Increased number of normal to enlarged lymph nodes throughout the neck.  As an index lymph node, left level II lymph node measures 1.3 x 0.8 x 1.9 cm.  Although these lymph nodes may be reactive origin, tumor (lymphoma/head and neck) not excluded.  No evidence of carotid artery stenosis or dissection.  Ectatic distal vertical cervical segment of the internal carotid artery just proximal to the skull base bilaterally without pleated appearance as may be seen with fibromuscular dysplasia.  Evaluation of the proximal aspect of the right vertebral artery is limited by streak artery from injected contrast.  No obvious narrowing or dissection of either vertebral artery.  Cervical spondylotic changes most prominent C6-7.   Review of the MIP images confirms the above findings.  IMPRESSION: No evidence of carotid artery stenosis or dissection.  Ectatic distal vertical cervical segment of the internal carotid artery just proximal to the skull base bilaterally without pleated appearance as may be seen with fibromuscular dysplasia.  Evaluation of the proximal aspect of the right vertebral artery is limited by streak artery from injected contrast.  No obvious narrowing or dissection of either vertebral artery.  Fatty replacement of the right parotid gland.  Tiny calcifications with intraparotid lymph nodes/masses left parotid gland.  Increased number of normal to enlarged lymph nodes throughout the neck.  As an index lymph node, left level II lymph node measures 1.3 x 0.8 x 1.9 cm.  Although these lymph nodes may be reactive origin, tumor (lymphoma/head and neck) not excluded.  CTA HEAD  Findings:  No intracranial hemorrhage.  No CT  evidence of large acute infarct.  No intracranial enhancing lesion.  No hydrocephalus.  Previously questioned atrophy not as apparent on the present exam.  No significant stenosis of the codominant vertebral arteries. PICAs visualized.  Mild irregularity of the basilar artery without high-grade  stenosis.  Both AICAs visualized.  No significant narrowing of the superior cerebellar arteries or posterior cerebral arteries.  Anterior circulation without to medium or large size vessel narrowing or occlusion. Mild calcification cavernous segment of the internal carotid artery bilaterally.  No aneurysm detected.  Mucosal thickening maxillary sinuses and ethmoid sinus air cells.   Review of the MIP images confirms the above findings.  IMPRESSION: No intracranial medium or large size vessel significant stenosis or occlusion as detailed above.  Original Report Authenticated By: Fuller Canada, M.D.   Dg Chest 2 View  12/29/2010  *RADIOLOGY REPORT*  Clinical Data: Dizziness and weakness  CHEST - 2 VIEW  Comparison: 12/26/2010  Findings: Heart size is normal.  No pleural effusion or pulmonary edema.  No airspace consolidation identified.   Review of the visualized osseous structures is negative.  IMPRESSION:  1.  No acute cardiopulmonary abnormalities.  Original Report Authenticated By: Rosealee Albee, M.D.   Ct Angio Neck W/cm &/or Wo/cm  12/29/2010  *RADIOLOGY REPORT*  Clinical Data:  History of epilepsy.  Dizziness.  Altered level of consciousness.  Headaches.  Question basilar stenosis?  CT ANGIOGRAPHY HEAD AND NECK  Technique:  Multidetector CT imaging of the head and neck was performed using the standard protocol during bolus administration of intravenous contrast.  Multiplanar CT image reconstructions including MIPs were obtained to evaluate the vascular anatomy. Carotid stenosis measurements (when applicable) are obtained utilizing NASCET criteria, using the distal internal carotid diameter as the denominator.  Contrast: 50mL OMNIPAQUE IOHEXOL 350 MG/ML IV SOLN  Comparison:  12/29/2010 MR brain.  CTA NECK  Findings:  Fatty replacement of the right parotid gland.  Tiny calcifications with intraparotid lymph nodes/masses left parotid gland.  Increased number of normal to enlarged lymph nodes  throughout the neck.  As an index lymph node, left level II lymph node measures 1.3 x 0.8 x 1.9 cm.  Although these lymph nodes may be reactive origin, tumor (lymphoma/head and neck) not excluded.  No evidence of carotid artery stenosis or dissection.  Ectatic distal vertical cervical segment of the internal carotid artery just proximal to the skull base bilaterally without pleated appearance as may be seen with fibromuscular dysplasia.  Evaluation of the proximal aspect of the right vertebral artery is limited by streak artery from injected contrast.  No obvious narrowing or dissection of either vertebral artery.  Cervical spondylotic changes most prominent C6-7.   Review of the MIP images confirms the above findings.  IMPRESSION: No evidence of carotid artery stenosis or dissection.  Ectatic distal vertical cervical segment of the internal carotid artery just proximal to the skull base bilaterally without pleated appearance as may be seen with fibromuscular dysplasia.  Evaluation of the proximal aspect of the right vertebral artery is limited by streak artery from injected contrast.  No obvious narrowing or dissection of either vertebral artery.  Fatty replacement of the right parotid gland.  Tiny calcifications with intraparotid lymph nodes/masses left parotid gland.  Increased number of normal to enlarged lymph nodes throughout the neck.  As an index lymph node, left level II lymph node measures 1.3 x 0.8 x 1.9 cm.  Although these lymph nodes may be reactive origin, tumor (lymphoma/head and neck) not excluded.  CTA HEAD  Findings:  No intracranial hemorrhage.  No CT evidence of large acute infarct.  No intracranial enhancing lesion.  No hydrocephalus.  Previously questioned atrophy not as apparent on the present exam.  No significant stenosis of the codominant vertebral arteries. PICAs visualized.  Mild irregularity of the basilar artery without high-grade stenosis.  Both AICAs visualized.  No significant  narrowing of the superior cerebellar arteries or posterior cerebral arteries.  Anterior circulation without to medium or large size vessel narrowing or occlusion. Mild calcification cavernous segment of the internal carotid artery bilaterally.  No aneurysm detected.  Mucosal thickening maxillary sinuses and ethmoid sinus air cells.   Review of the MIP images confirms the above findings.  IMPRESSION: No intracranial medium or large size vessel significant stenosis or occlusion as detailed above.  Original Report Authenticated By: Fuller Canada, M.D.   Mr Brain Wo Contrast  12/29/2010  *RADIOLOGY REPORT*  Clinical Data: Dizziness with unsteady gait.  Questionable seizures.  MRI HEAD WITHOUT CONTRAST  Technique:  Multiplanar, multiecho pulse sequences of the brain and surrounding structures were obtained according to standard protocol without intravenous contrast.  Comparison: 08/22/2010 MR and CT.  Findings: Motion degraded exam.  No acute infarct.  No intracranial hemorrhage.  Mild global atrophy without hydrocephalus.  No intracranial mass lesion or seizure focus detected on this unenhanced motion degraded exam.  Major intracranial vascular structures appear grossly patent.  Minimal paranasal sinus mucosal thickening.  IMPRESSION: Motion degraded exam without acute abnormality.  Please see above.  Original Report Authenticated By: Fuller Canada, M.D.   Dg Chest Port 1 View  01/02/2011  *RADIOLOGY REPORT*  Clinical Data: Infiltrate.  PORTABLE CHEST - 1 VIEW  Comparison: 12/29/2010.  Findings: Trachea is midline.  Heart size normal.  Mild bibasilar air space disease, right greater than left.  No pleural fluid.  IMPRESSION: Mild bibasilar air space disease, right greater than left.  Original Report Authenticated By: Reyes Ivan, M.D.   Dg Chest Port 1 View  12/26/2010  *RADIOLOGY REPORT*  Clinical Data: Fever  PORTABLE CHEST - 1 VIEW  Comparison: 03/01/2008  Findings: Cardiomediastinal silhouette  is stable.  There is linear atelectasis or infiltrate in the left lower lobe.  No pulmonary edema  IMPRESSION: Linear atelectasis or infiltrate in left lower lobe.  No pulmonary edema.  Original Report Authenticated By: Natasha Mead, M.D.   Dg Fluoro Guide Ndl Plc/bx  12/30/2010  *RADIOLOGY REPORT*  Clinical Data:  Altered mental status.  DIAGNOSTIC LUMBAR PUNCTURE UNDER FLUOROSCOPIC GUIDANCE  Fluoroscopy time:  0.28 minutes.  Technique:  Informed consent was obtained from the patient prior to the procedure, including potential complications of headache, allergy, and pain.   With the patient prone, the lower back was prepped with Betadine.  1% Lidocaine was used for local anesthesia. Lumbar puncture was performed at the left paramidline L1-2 level using a 20 gauge needle with return of somewhat bloody CSF.  Good flow could not be established.  I therefore re-punctured via a right paramidline approach at L2-3.  Clear CSF was obtained.  The opening pressure was 17.5 cm water, within normal limits.   10.0 ml of CSF were obtained for laboratory studies.  The patient tolerated the procedure well and there were no apparent complications.  IMPRESSION:  1.  Technically successful fluoroscopic guided lumbar puncture via a right paramidline approach at L2-3. 2.  An initial attempts of the left paramidline approach at L1-2 was unsuccessful. 3.  The opening pressure was  within normal limits at 17.5 cm water. 4.  10 ml of clear CSF was sent for laboratory examination as ordered.  Original Report Authenticated By: Jamesetta Orleans. MATTERN, M.D.     Hospital Course:  Seizure  Influenza A  Vertigo, benign positional   This is a 46 y/o female with a history of seizures who was diagnosed with the flu 2 days prior to presenting to the ER. On 12/15 she felt dizzy, noticed gait imbalance, had jerking movments of legs and became confused. She was brought to the ER where the husband witnessed a seizure.   She was admitted and  above mentioned studies done, had a neurology eval and was started on acyclovir and antibiotics for presumed meningitis or encephalitis.  As no source of infection was found and the patient returned to her usual mental state quickly, above mentioned anti-infectives have been discontinued except for Tamiflu. An influenza PCR was positive for influenza A. She was given a prescription for Tamiflu when when she was initially suspected to have the flu but she had not had it filled.   She is now doing well and has had a PT evaluation and is noted to be walking without any balance difficulties or dizziness. She has no flu like symptoms at the present time and is safe to be discharged home.  Her seizure medications have not been changed as it is suspected that the seizure was likely precipitated by the flu.     Day of Discharge Physical Exam: BP 123/90  Pulse 88  Temp(Src) 97.4 F (36.3 C) (Oral)  Resp 18  Ht 5' (1.524 m)  Wt 77.1 kg (169 lb 15.6 oz)  BMI 33.20 kg/m2  SpO2 94% General: No acute respiratory distress  Lungs: mild crackles in RLL Cardiovascular: Regular rate and rhythm without murmur gallop or rub normal S1 and S2  Abdomen: Nontender, nondistended, soft, bowel sounds positive, no rebound, no ascites, no appreciable mass  Extremities: No significant cyanosis, clubbing, or edema bilateral lower extremities   Results for orders placed during the hospital encounter of 12/29/10 (from the past 24 hour(s))  INFLUENZA PANEL BY PCR     Status: Abnormal   Collection Time   01/01/11  5:25 PM      Component Value Range   Influenza A By PCR POSITIVE (*) NEGATIVE    Influenza B By PCR NEGATIVE  NEGATIVE    H1N1 flu by pcr NOT DETECTED  NOT DETECTED     Disposition: stable  Discharge Orders    Future Orders Please Complete By Expires   Diet - low sodium heart healthy      Increase activity slowly         Follow-up with Dr.Susan Weeks in 1-2 weeks.  Time on Discharge: 50 min.    SignedCalvert Cantor 01/02/2011, 2:50 PM

## 2011-01-03 ENCOUNTER — Encounter (HOSPITAL_COMMUNITY): Payer: Self-pay | Admitting: Emergency Medicine

## 2011-01-03 ENCOUNTER — Emergency Department (HOSPITAL_COMMUNITY)
Admission: EM | Admit: 2011-01-03 | Discharge: 2011-01-03 | Disposition: A | Discharge status: Home / Self Care | Payer: Medicare Other | Attending: Emergency Medicine | Admitting: Emergency Medicine

## 2011-01-03 ENCOUNTER — Encounter (HOSPITAL_COMMUNITY): Payer: Self-pay | Admitting: *Deleted

## 2011-01-03 DIAGNOSIS — X58XXXA Exposure to other specified factors, initial encounter: Secondary | ICD-10-CM | POA: Insufficient documentation

## 2011-01-03 DIAGNOSIS — G40909 Epilepsy, unspecified, not intractable, without status epilepticus: Secondary | ICD-10-CM | POA: Insufficient documentation

## 2011-01-03 DIAGNOSIS — R569 Unspecified convulsions: Secondary | ICD-10-CM

## 2011-01-03 DIAGNOSIS — R230 Cyanosis: Secondary | ICD-10-CM | POA: Insufficient documentation

## 2011-01-03 DIAGNOSIS — IMO0002 Reserved for concepts with insufficient information to code with codable children: Secondary | ICD-10-CM | POA: Insufficient documentation

## 2011-01-03 DIAGNOSIS — Z79899 Other long term (current) drug therapy: Secondary | ICD-10-CM | POA: Insufficient documentation

## 2011-01-03 DIAGNOSIS — F29 Unspecified psychosis not due to a substance or known physiological condition: Secondary | ICD-10-CM | POA: Insufficient documentation

## 2011-01-03 DIAGNOSIS — R404 Transient alteration of awareness: Secondary | ICD-10-CM | POA: Insufficient documentation

## 2011-01-03 MED ORDER — CLONAZEPAM 0.5 MG PO TABS
0.5000 mg | ORAL_TABLET | Freq: Every day | ORAL | Status: DC
  Administered 2011-01-03: 0.5 mg via ORAL
  Filled 2011-01-03: qty 1

## 2011-01-03 MED ORDER — LACOSAMIDE 100 MG PO TABS: 150.0000 mg | ORAL_TABLET | Freq: Two times a day (BID) | ORAL | Status: DC

## 2011-01-03 MED ORDER — LACOSAMIDE 50 MG PO TABS
150.0000 mg | ORAL_TABLET | Freq: Two times a day (BID) | ORAL | Status: DC
  Administered 2011-01-03: 150 mg via ORAL
  Filled 2011-01-03: qty 3

## 2011-01-03 MED ORDER — LAMOTRIGINE 200 MG PO TABS
200.0000 mg | ORAL_TABLET | Freq: Every day | ORAL | Status: DC
  Administered 2011-01-03: 200 mg via ORAL
  Filled 2011-01-03: qty 1

## 2011-01-03 NOTE — ED Provider Notes (Signed)
History     CSN: [id number]  Arrival date & time 01/03/11  1610   First MD Initiated Contact with Patient 01/03/11 1625      Chief Complaint  Patient presents with  . Seizures    (Consider location/radiation/quality/duration/timing/severity/associated sxs/prior treatment) Patient is a 45 y.o. female presenting with seizures.  Seizures  This is a recurrent problem. The current episode started less than 1 hour ago. The problem has been gradually improving. There was 1 seizure. The most recent episode lasted 30 to 120 seconds. Characteristics include loss of consciousness and bit tongue. Characteristics do not include bowel incontinence, bladder incontinence or rhythmic jerking. pt tightened up, started chewing on tongue, frothing at mouth The episode was witnessed. There was no sensation of an aura present. The seizures did not continue in the ED.    Past Medical History  Diagnosis Date  . Epilepsy   . Seizures   . Vertigo   . Constipation     Past Surgical History  Procedure Date  . Total hip arthroplasty   . Abdominal hysterectomy   . Colonoscopy   . Joint replacement     Family History  Problem Relation Age of Onset  . Cancer Mother   . Cancer Brother   . Diabetes Brother   . Cancer Maternal Aunt   . Diabetes Maternal Aunt     History  Substance Use Topics  . Smoking status: Never Smoker   . Smokeless tobacco: Not on file  . Alcohol Use: No    OB History    Grav Para Term Preterm Abortions TAB SAB Ect Mult Living                  Review of Systems  Gastrointestinal: Negative for bowel incontinence.  Genitourinary: Negative for bladder incontinence.  Neurological: Positive for seizures and loss of consciousness.    Allergies  Ultram; Vicodin; Latex; and Penicillins  Home Medications   Current Outpatient Rx  Name Route Sig Dispense Refill  . CLONAZEPAM 0.5 MG PO TABS Oral Take 0.5 mg by mouth at bedtime.      Marland Kitchen LACOSAMIDE 100 MG PO TABS Oral  Take 1 tablet by mouth 2 (two) times daily.      Marland Kitchen LAMOTRIGINE 200 MG PO TABS Oral Take 200 mg by mouth 2 (two) times daily.      . OSELTAMIVIR PHOSPHATE 75 MG PO CAPS Oral Take 1 capsule (75 mg total) by mouth every 12 (twelve) hours. 3 capsule 0  . PAROXETINE HCL 20 MG PO TABS Oral Take 20 mg by mouth at bedtime.        BP 107/72  Pulse 90  Temp(Src) 98.4 F (36.9 C) (Oral)  Resp 20  SpO2 92%  Physical Exam  ED Course  Procedures (including critical care time)  Labs Reviewed - No data to display Dg Chest [facility] 1 View  01/02/2011  *RADIOLOGY REPORT*  Clinical Data: Infiltrate.  PORTABLE CHEST - 1 VIEW  Comparison: 12/29/2010.  Findings: Trachea is midline.  Heart size normal.  Mild bibasilar air space disease, right greater than left.  No pleural fluid.  IMPRESSION: Mild bibasilar air space disease, right greater than left.  Original Report Authenticated By: Reyes Ivan, M.D.     No diagnosis found.  Spoke with Dr. Thad Ranger with neurology who said that pt just had extensive work-up in hospital including EEG, CTA of head/neck/MRI.  Recommends increasing vimpat to 150mg  BID and have her f/u with [name]  MDM  TIA/atypical seizure/meningitis.  Pt with no fever/neck stiffness.  No neuro deficits now.  Had recent workup for CVA in hospital.        Rolan Bucco, MD 01/05/11 0005

## 2011-01-03 NOTE — ED Provider Notes (Signed)
Medical screening examination/treatment/procedure(s) were performed by non-physician practitioner and as supervising physician I was immediately available for consultation/collaboration.   Rolan Bucco, MD 01/03/11 [phone]

## 2011-01-03 NOTE — ED Notes (Signed)
JWJ:[id number]<NW> Expected date:01/03/11<BR> Expected time: 4:02 PM<BR> Means of arrival:Ambulance<BR> Comments:<BR> Rock. 45 yo f. SZ, hx of sz. Was post ictal now stable. 10 min eta.

## 2011-01-03 NOTE — ED Notes (Signed)
When pt seized she fell and hit her head, also bit down on her tongue.  Complains of h/a, small bit marks noted on tongue.

## 2011-01-03 NOTE — ED Notes (Signed)
Per EMS: Pt had witness seizure at [employer]. Pt alert and oriented. VSS. No head trauma. Reports she is unsure is this was one of "my pseudo seizures or a gran mal seizure." Pt had no incontinence of bowel and bladder, did bite tongue. Son at bedside.

## 2011-01-03 NOTE — ED Notes (Signed)
Per EMS:  Pt had a seizure while trying to fill her seizure medication, pt was in this ED earlier in the day for the same thing.  Pt was post ictal upon EMS' arrival.  Airway intact, pt was oriented x2 earlier, x4 now.

## 2011-01-03 NOTE — ED Provider Notes (Signed)
History     CSN: [id number]  Arrival date & time 01/03/11  7829   First MD Initiated Contact with Patient 01/03/11 2021      Chief Complaint  Patient presents with  . Seizures    (Consider location/radiation/quality/duration/timing/severity/associated sxs/prior treatment) HPI Comments: Patient here after having been discharged today with similar complaints - was just discharged from the hospital yesterday after having had an extensive work up for TIA/CVA/seizure.  Her son reports that they were in the pharmacy getting her new medications filled when she had another seizure.  He reports that this seizure consisted of her huffing and puffing, then holding her breath and bringing her arms flexed into her chest - no tonic clonic activity.  We had discussed this patient with Dr. Thad Ranger who had asked that she increase the dose of vimpat to 150mg  BID.  Patient is a 45 y.o. female presenting with seizures. The history is provided by the patient and a relative. No language interpreter was used.  Seizures  This is a chronic problem. The current episode started less than 1 hour ago. The problem has been resolved. There was 1 seizure. The most recent episode lasted 2 to 5 minutes. Associated symptoms include confusion. Pertinent negatives include no headaches, no speech difficulty, no neck stiffness, no sore throat, no cough, no nausea, no vomiting, no diarrhea and no muscle weakness. Characteristics include eye blinking, loss of consciousness, bit tongue and cyanosis. Characteristics do not include bowel incontinence, bladder incontinence or rhythmic jerking. The episode was witnessed. There was no sensation of an aura present. The seizures did not continue in the ED. The seizure(s) had no focality. Possible causes include med or dosage change. Possible causes do not include missed seizure meds or recent illness. There has been no fever. There were no medications administered prior to arrival.    Past  Medical History  Diagnosis Date  . Epilepsy   . Seizures   . Vertigo   . Constipation     Past Surgical History  Procedure Date  . Total hip arthroplasty   . Abdominal hysterectomy   . Colonoscopy   . Joint replacement     Family History  Problem Relation Age of Onset  . Cancer Mother   . Cancer Brother   . Diabetes Brother   . Cancer Maternal Aunt   . Diabetes Maternal Aunt     History  Substance Use Topics  . Smoking status: Never Smoker   . Smokeless tobacco: Not on file  . Alcohol Use: No    OB History    Grav Para Term Preterm Abortions TAB SAB Ect Mult Living                  Review of Systems  HENT: Negative for sore throat.   Respiratory: Negative for cough.   Cardiovascular: Positive for cyanosis.  Gastrointestinal: Negative for nausea, vomiting, diarrhea and bowel incontinence.  Genitourinary: Negative for bladder incontinence.  Neurological: Positive for seizures and loss of consciousness. Negative for speech difficulty and headaches.  Psychiatric/Behavioral: Positive for confusion.  All other systems reviewed and are negative.    Allergies  Ultram; Vicodin; Latex; and Penicillins  Home Medications   Current Outpatient Rx  Name Route Sig Dispense Refill  . CLONAZEPAM 0.5 MG PO TABS Oral Take 0.5 mg by mouth at bedtime.      Marland Kitchen LACOSAMIDE 100 MG PO TABS Oral Take 1.5 tablets (150 mg total) by mouth 2 (two) times daily. 60 tablet 0  .  LAMOTRIGINE 200 MG PO TABS Oral Take 200 mg by mouth 2 (two) times daily.      . OSELTAMIVIR PHOSPHATE 75 MG PO CAPS Oral Take 1 capsule (75 mg total) by mouth every 12 (twelve) hours. 3 capsule 0  . PAROXETINE HCL 20 MG PO TABS Oral Take 20 mg by mouth at bedtime.        BP 110/73  Pulse 92  Temp(Src) 98.2 F (36.8 C) (Oral)  Resp 16  SpO2 97%  Physical Exam  Nursing note and vitals reviewed. Constitutional: She is oriented to person, place, and time. She appears well-developed and well-nourished. No  distress.  HENT:  Head: Normocephalic and atraumatic.  Right Ear: External ear normal.  Left Ear: External ear normal.       Small abrasion noted to right lateral portion of tongue - non-suturable  Eyes: Conjunctivae are normal. Pupils are equal, round, and reactive to light. No scleral icterus.  Neck: Normal range of motion. Neck supple.  Cardiovascular: Normal rate, regular rhythm and normal heart sounds.  Exam reveals no gallop and no friction rub.   No murmur heard. Pulmonary/Chest: Effort normal and breath sounds normal. No respiratory distress. She exhibits no tenderness.  Abdominal: Soft. Bowel sounds are normal. She exhibits no distension. There is no tenderness.  Musculoskeletal: Normal range of motion.  Lymphadenopathy:    She has no cervical adenopathy.  Neurological: She is alert and oriented to person, place, and time. No cranial nerve deficit.  Skin: Skin is warm and dry.  Psychiatric: She has a normal mood and affect. Her behavior is normal. Judgment and thought content normal.    ED Course  Procedures (including critical care time)  Labs Reviewed - No data to display Dg Chest [facility] 1 View  01/02/2011  *RADIOLOGY REPORT*  Clinical Data: Infiltrate.  PORTABLE CHEST - 1 VIEW  Comparison: 12/29/2010.  Findings: Trachea is midline.  Heart size normal.  Mild bibasilar air space disease, right greater than left.  No pleural fluid.  IMPRESSION: Mild bibasilar air space disease, right greater than left.  Original Report Authenticated By: Reyes Ivan, M.D.     Seizure disorder    MDM  Patient given her increased dose in vimpat and her lamictal here as well - discussed this patient with Dr. Fredderick Phenix, who saw the patient earlier in the evening.  The plan is to send the patient home - continue to try the increase in the vimpat and then she will follow up with [name] as discussed.        Izola Price [place], [place] 01/03/11 2249

## 2011-01-03 NOTE — ED Notes (Signed)
BJY:[id number]<FA> Expected date:01/03/11<BR> Expected time: 7:06 PM<BR> Means of arrival:Ambulance<BR> Comments:<BR> EMS 50 GC - seizure/hx of same

## 2011-01-05 LAB — CULTURE, BLOOD (ROUTINE X 2)
Culture  Setup Time: 201212170223
Culture  Setup Time: 201212170224
Culture: NO GROWTH
Culture: NO GROWTH

## 2011-03-29 ENCOUNTER — Inpatient Hospital Stay (HOSPITAL_COMMUNITY)
Admission: EM | Admit: 2011-03-29 | Discharge: 2011-03-30 | DRG: 101 | Disposition: A | Discharge status: Home / Self Care | Payer: Medicare Other | Attending: Internal Medicine | Admitting: Internal Medicine

## 2011-03-29 ENCOUNTER — Encounter (HOSPITAL_COMMUNITY): Payer: Self-pay | Admitting: *Deleted

## 2011-03-29 ENCOUNTER — Emergency Department (HOSPITAL_COMMUNITY): Payer: Medicare Other

## 2011-03-29 DIAGNOSIS — Z96649 Presence of unspecified artificial hip joint: Secondary | ICD-10-CM

## 2011-03-29 DIAGNOSIS — F411 Generalized anxiety disorder: Secondary | ICD-10-CM | POA: Diagnosis present

## 2011-03-29 DIAGNOSIS — G40401 Other generalized epilepsy and epileptic syndromes, not intractable, with status epilepticus: Principal | ICD-10-CM | POA: Diagnosis present

## 2011-03-29 DIAGNOSIS — G40901 Epilepsy, unspecified, not intractable, with status epilepticus: Secondary | ICD-10-CM

## 2011-03-29 DIAGNOSIS — G4700 Insomnia, unspecified: Secondary | ICD-10-CM | POA: Diagnosis present

## 2011-03-29 DIAGNOSIS — R569 Unspecified convulsions: Secondary | ICD-10-CM | POA: Diagnosis present

## 2011-03-29 LAB — CBC
HCT: 38.3 % (ref 36.0–46.0)
Hemoglobin: 13.1 g/dL (ref 12.0–15.0)
MCH: 29.8 pg (ref 26.0–34.0)
MCHC: 34.2 g/dL (ref 30.0–36.0)
MCV: 87 fL (ref 78.0–100.0)
Platelets: 330 10*3/uL (ref 150–400)
RBC: 4.4 MIL/uL (ref 3.87–5.11)
RDW: 13.4 % (ref 11.5–15.5)
WBC: 17.7 10*3/uL — ABNORMAL HIGH (ref 4.0–10.5)

## 2011-03-29 LAB — URINALYSIS, ROUTINE W REFLEX MICROSCOPIC
Bilirubin Urine: NEGATIVE
Glucose, UA: NEGATIVE mg/dL
Hgb urine dipstick: NEGATIVE
Ketones, ur: NEGATIVE mg/dL
Leukocytes, UA: NEGATIVE
Nitrite: NEGATIVE
Protein, ur: NEGATIVE mg/dL
Specific Gravity, Urine: 1.017 (ref 1.005–1.030)
Urobilinogen, UA: 0.2 mg/dL (ref 0.0–1.0)
pH: 6.5 (ref 5.0–8.0)

## 2011-03-29 LAB — BASIC METABOLIC PANEL
BUN: 11 mg/dL (ref 6–23)
CO2: 22 mEq/L (ref 19–32)
Calcium: 10.4 mg/dL (ref 8.4–10.5)
Chloride: 102 mEq/L (ref 96–112)
Creatinine, Ser: 0.6 mg/dL (ref 0.50–1.10)
GFR calc Af Amer: >90 mL/min (ref >90)
GFR calc non Af Amer: >90 mL/min (ref >90)
Glucose, Bld: 111 mg/dL — ABNORMAL HIGH (ref 70–99)
Potassium: 3.8 mEq/L (ref 3.5–5.1)
Sodium: 141 mEq/L (ref 135–145)

## 2011-03-29 LAB — DIFFERENTIAL
Basophils Absolute: 0 10*3/uL (ref 0.0–0.1)
Basophils Relative: 0 % (ref 0–1)
Eosinophils Absolute: 0 10*3/uL (ref 0.0–0.7)
Eosinophils Relative: 0 % (ref 0–5)
Lymphocytes Relative: 17 % (ref 12–46)
Lymphs Abs: 3 10*3/uL (ref 0.7–4.0)
Monocytes Absolute: 0.8 10*3/uL (ref 0.1–1.0)
Monocytes Relative: 5 % (ref 3–12)
Neutro Abs: 13.9 10*3/uL — ABNORMAL HIGH (ref 1.7–7.7)
Neutrophils Relative %: 78 % — ABNORMAL HIGH (ref 43–77)

## 2011-03-29 MED ORDER — ONDANSETRON HCL 4 MG/2ML IJ SOLN: 4.0000 mg | Freq: Four times a day (QID) | INTRAMUSCULAR | Status: DC | PRN

## 2011-03-29 MED ORDER — PHENYTOIN SODIUM EXTENDED 100 MG PO CAPS
100.0000 mg | ORAL_CAPSULE | Freq: Three times a day (TID) | ORAL | Status: DC
Start: 2011-03-29 — End: 2011-03-30
  Administered 2011-03-30 (×2): 100 mg via ORAL
  Filled 2011-03-29 (×4): qty 1

## 2011-03-29 MED ORDER — PHENYTOIN SODIUM 50 MG/ML IJ SOLN
100.0000 mg | Freq: Three times a day (TID) | INTRAMUSCULAR | Status: DC
  Filled 2011-03-29: qty 2

## 2011-03-29 MED ORDER — SODIUM CHLORIDE 0.9 % IV SOLN: 1400.0000 mg | Freq: Once | INTRAVENOUS | Status: DC

## 2011-03-29 MED ORDER — ACETAMINOPHEN 650 MG RE SUPP: 650.0000 mg | Freq: Four times a day (QID) | RECTAL | Status: DC | PRN

## 2011-03-29 MED ORDER — ONDANSETRON HCL 4 MG PO TABS: 4.0000 mg | ORAL_TABLET | Freq: Four times a day (QID) | ORAL | Status: DC | PRN

## 2011-03-29 MED ORDER — ZOLPIDEM TARTRATE 5 MG PO TABS: 5.0000 mg | ORAL_TABLET | Freq: Every evening | ORAL | Status: DC | PRN

## 2011-03-29 MED ORDER — ACETAMINOPHEN 325 MG PO TABS
650.0000 mg | ORAL_TABLET | Freq: Four times a day (QID) | ORAL | Status: DC | PRN
  Administered 2011-03-29 – 2011-03-30 (×2): 650 mg via ORAL
  Filled 2011-03-29 (×2): qty 2

## 2011-03-29 MED ORDER — ALUM & MAG HYDROXIDE-SIMETH 200-200-20 MG/5ML PO SUSP: 30.0000 mL | Freq: Four times a day (QID) | ORAL | Status: DC | PRN

## 2011-03-29 MED ORDER — SODIUM CHLORIDE 0.9 % IV SOLN
INTRAVENOUS | Status: DC
  Administered 2011-03-29: 1000 mL via INTRAVENOUS

## 2011-03-29 MED ORDER — OXYCODONE HCL 5 MG PO TABS
5.0000 mg | ORAL_TABLET | ORAL | Status: DC | PRN
  Administered 2011-03-30: 5 mg via ORAL
  Filled 2011-03-29: qty 1

## 2011-03-29 MED ORDER — LORAZEPAM 2 MG/ML IJ SOLN
0.5000 mg | Freq: Once | INTRAMUSCULAR | Status: AC
  Administered 2011-03-29: 0.5 mg via INTRAVENOUS
  Filled 2011-03-29: qty 1

## 2011-03-29 MED ORDER — SODIUM CHLORIDE 0.9 % IV SOLN
INTRAVENOUS | Status: DC
  Administered 2011-03-29: 21:00:00 via INTRAVENOUS

## 2011-03-29 MED ORDER — HYDROMORPHONE HCL PF 1 MG/ML IJ SOLN: 0.5000 mg | INTRAMUSCULAR | Status: DC | PRN

## 2011-03-29 MED ORDER — SODIUM CHLORIDE 0.9 % IJ SOLN
3.0000 mL | Freq: Two times a day (BID) | INTRAMUSCULAR | Status: DC
  Administered 2011-03-30: 3 mL via INTRAVENOUS

## 2011-03-29 NOTE — ED Notes (Signed)
Pt sons states that she will have a blank stare prior to her seizures.

## 2011-03-29 NOTE — H&P (Addendum)
DATE OF ADMISSION:  03/29/2011  PCP:    Mathis Fare, Inetta Fermo, OTR   Chief Complaint: Seizures   HPI: [name] is an 46 y.o. female who was reported by her husband as suffering 8 seizures today.  Patient can not remember what had happened.  She was told afterward, that she had multiple episodes of blank staring, lasting minutes.  Patient was described as having confusion afterwards.  She was seen in the ED by the On call  Neurologist.    Past Medical History  Diagnosis Date  . Epilepsy   . Seizures   . Vertigo   . Constipation     Past Surgical History  Procedure Date  . Total hip arthroplasty   . Abdominal hysterectomy   . Colonoscopy   . Joint replacement     Medications:  HOME MEDS: Prior to Admission medications   Medication Sig Start Date End Date Taking? Authorizing Provider  clonazePAM (KLONOPIN) 0.5 MG tablet Take 0.5 mg by mouth at bedtime. For sleep   Yes Historical Provider, MD  ibuprofen (ADVIL,MOTRIN) 800 MG tablet Take 800 mg by mouth every 8 (eight) hours as needed. For pain   Yes Historical Provider, MD  Lacosamide 100 MG TABS Take 150 mg by mouth 2 (two) times daily.   Yes Rolan Bucco, MD  Lacosamide 100 MG TABS Take 150 mg by mouth 2 (two) times daily.   Yes Historical Provider, MD  lamoTRIgine (LAMICTAL) 200 MG tablet Take 200 mg by mouth 2 (two) times daily.     Yes Historical Provider, MD  PARoxetine (PAXIL) 20 MG tablet Take 20 mg by mouth at bedtime.     Yes Historical Provider, MD    Allergies:  Allergies  Allergen Reactions  . Codeine     rash  . Ultram (Tramadol Hcl) Other (See Comments)    Reaction:Seizures  . Vicodin (Hydrocodone-Acetaminophen) Itching  . Latex Rash  . Penicillins Itching and Rash    Social History:   reports that she has never smoked. She does not have any smokeless tobacco history on file. She reports that she does not drink alcohol or use illicit drugs.  Family History: Family History  Problem  Relation Age of Onset  . Cancer Mother   . Cancer Brother   . Diabetes Brother   . Cancer Maternal Aunt   . Diabetes Maternal Aunt     Review of Systems:  The patient denies anorexia, fever, weight loss,, vision loss, decreased hearing, hoarseness, chest pain, syncope, dyspnea on exertion, peripheral edema, balance deficits, hemoptysis, abdominal pain, melena, hematochezia, severe indigestion/heartburn, hematuria, incontinence, genital sores, muscle weakness, suspicious skin lesions, transient blindness, difficulty walking, depression, unusual weight change, abnormal bleeding, enlarged lymph nodes, angioedema, and breast masses.   Physical Exam:  GEN:  Pleasant 46 year old morbidly Obese Caucasian female examined  and in no acute distress; cooperative with exam Filed Vitals:   03/29/11 1459 03/29/11 1846 03/29/11 2129 03/29/11 2215  BP: 124/74 110/71 127/93 113/77  Pulse: 86 82  83  Temp: 98.8 F (37.1 C) 97.3 F (36.3 C) 99 F (37.2 C) 98.3 F (36.8 C)  TempSrc: Oral Oral Oral Oral  Resp: 25 22 12 20   SpO2: 97% 95% 97% 93%   Blood pressure 113/77, pulse 83, temperature 98.3 F (36.8 C), temperature source Oral, resp. rate 20, SpO2 93.00%. PSYCH: SHe is alert and oriented x4; does not appear anxious does not appear depressed; affect is normal HEENT: Normocephalic and Atraumatic, Mucous  membranes pink; PERRLA; EOM intact; Fundi:  Benign;  No scleral icterus, Nares: Patent, Oropharynx: Clear, Edentulous or Fair Dentition, Neck:  FROM, no cervical lymphadenopathy nor thyromegaly or carotid bruit; no JVD; Breasts:: Not examined CHEST WALL: No tenderness CHEST: Normal respiration, clear to auscultation bilaterally HEART: Regular rate and rhythm; no murmurs rubs or gallops BACK: No kyphosis or scoliosis; no CVA tenderness ABDOMEN: Positive Bowel Sounds, Obese, soft non-tender; no masses, no organomegaly Rectal Exam: Not done EXTREMITIES: No bone or joint deformity; age-appropriate  arthropathy of the hands and knees; no cyanosis, clubbing or edema; no ulcerations. Genitalia: not examined PULSES: 2+ and symmetric SKIN: Normal hydration no rash or ulceration CNS: Cranial nerves 2-12 grossly intact no focal neurologic deficit   Labs & Imaging Results for orders placed during the hospital encounter of 03/29/11 (from the past 48 hour(s))  CBC     Status: Abnormal   Collection Time   03/29/11 12:18 PM      Component Value Range Comment   WBC 17.7 (*) 4.0 - 10.5 (K/uL)    RBC 4.40  3.87 - 5.11 (MIL/uL)    Hemoglobin 13.1  12.0 - 15.0 (g/dL)    HCT 46.9  62.9 - 52.8 (%)    MCV 87.0  78.0 - 100.0 (fL)    MCH 29.8  26.0 - 34.0 (pg)    MCHC 34.2  30.0 - 36.0 (g/dL)    RDW 41.3  24.4 - 01.0 (%)    Platelets 330  150 - 400 (K/uL)   DIFFERENTIAL     Status: Abnormal   Collection Time   03/29/11 12:18 PM      Component Value Range Comment   Neutrophils Relative 78 (*) 43 - 77 (%)    Neutro Abs 13.9 (*) 1.7 - 7.7 (K/uL)    Lymphocytes Relative 17  12 - 46 (%)    Lymphs Abs 3.0  0.7 - 4.0 (K/uL)    Monocytes Relative 5  3 - 12 (%)    Monocytes Absolute 0.8  0.1 - 1.0 (K/uL)    Eosinophils Relative 0  0 - 5 (%)    Eosinophils Absolute 0.0  0.0 - 0.7 (K/uL)    Basophils Relative 0  0 - 1 (%)    Basophils Absolute 0.0  0.0 - 0.1 (K/uL)   BASIC METABOLIC PANEL     Status: Abnormal   Collection Time   03/29/11 12:18 PM      Component Value Range Comment   Sodium 141  135 - 145 (mEq/L)    Potassium 3.8  3.5 - 5.1 (mEq/L)    Chloride 102  96 - 112 (mEq/L)    CO2 22  19 - 32 (mEq/L)    Glucose, Bld 111 (*) 70 - 99 (mg/dL)    BUN 11  6 - 23 (mg/dL)    Creatinine, Ser 2.72  0.50 - 1.10 (mg/dL)    Calcium 53.6  8.4 - 10.5 (mg/dL)    GFR calc non Af Amer >90  >90 (mL/min)    GFR calc Af Amer >90  >90 (mL/min)   URINALYSIS, ROUTINE W REFLEX MICROSCOPIC     Status: Normal   Collection Time   03/29/11  2:59 PM      Component Value Range Comment   Color, Urine YELLOW  YELLOW       APPearance CLEAR  CLEAR     Specific Gravity, Urine 1.017  1.005 - 1.030     pH 6.5  5.0 - 8.0  Glucose, UA NEGATIVE  NEGATIVE (mg/dL)    Hgb urine dipstick NEGATIVE  NEGATIVE     Bilirubin Urine NEGATIVE  NEGATIVE     Ketones, ur NEGATIVE  NEGATIVE (mg/dL)    Protein, ur NEGATIVE  NEGATIVE (mg/dL)    Urobilinogen, UA 0.2  0.0 - 1.0 (mg/dL)    Nitrite NEGATIVE  NEGATIVE     Leukocytes, UA NEGATIVE  NEGATIVE  MICROSCOPIC NOT DONE ON URINES WITH NEGATIVE PROTEIN, BLOOD, LEUKOCYTES, NITRITE, OR GLUCOSE <1000 mg/dL.   Ct Head Wo Contrast  03/29/2011  *RADIOLOGY REPORT*  Clinical Data: Seizures.  Fall 2 days ago with seizures since. Altered mental status.  CT HEAD WITHOUT CONTRAST  Technique:  Contiguous axial images were obtained from the base of the skull through the vertex without contrast.  Comparison: CT angio head 12/29/2010.  Findings: No acute cortical infarct, hemorrhage, or mass lesion is present.  The ventricles are of normal size.  No significant extra- axial fluid collection is present.  The paranasal sinuses are clear.  The mastoids are sclerotic bilaterally, suggesting a history of prior disease. Alternately, this could be due to lack of significant aeration.  IMPRESSION:  1.  Normal CT appearance of the brain. 2.  Evidence for chronic mastoid disease bilaterally.  Original Report Authenticated By: Jamesetta Orleans. MATTERN, M.D.      Assessment/Plan: Present on Admission:  .Seizure   Plan:  Admit to Telemetry , Seizure precautions. Neurologic Checks Loaded with IV Dilantin and then placed on Oral dilantin 100 mg po q 8 hours. EEG study ordered Neuro saw in ED and consulted. SCDs for DVT prophylaxis Other plans as per orders.    CODE STATUS:      FULL CODE         Glendoris Nodarse C 03/29/2011, 11:09 PM

## 2011-03-29 NOTE — ED Notes (Signed)
Pt here for seizure activity actively seizing in triage for 45 seconds, per son has had 5 episodes since yesterday of same

## 2011-03-29 NOTE — ED Provider Notes (Signed)
8:04 PM I reviewed Dr. Durward Mallard neurology consult. He recommends overnight observation as pt seems quite drowsy after treatment with Ativan for presumed seizure.   8:37 PM Case discussed with Dr. Della Goo.  Admit to [facility], Team 6, telemetry, Dr. Gwenlyn Perking.   Carleene Cooper III, MD 03/29/11 2038

## 2011-03-29 NOTE — ED Notes (Signed)
Pt responded to questions asked by RN. States that she has a slight headache. Son states that she has complained of this on the ride to the hospital.

## 2011-03-29 NOTE — ED Notes (Signed)
Admitting md at bedside

## 2011-03-29 NOTE — ED Provider Notes (Signed)
History     CSN: [id number]  Arrival date & time 03/29/11  1122   First MD Initiated Contact with Patient 03/29/11 1204      Chief Complaint  Patient presents with  . Seizures    (Consider location/radiation/quality/duration/timing/severity/associated sxs/prior treatment) HPI Comments: Patient here with son who reports patient with long history of epileptic seizures - was seen in December after she had several seizures in a row and was admitted after which time her Vimpat was increased and she was seizure free until last night when she has had a total of 6 additional seizures.  She was brought here because she has never has returned to her baseline - her family reports that she has remained somnulent and confused since the event - he also reports a possibility that she may have struck her head during on of these events.  He denies fever (though she is febrile here), chills, she has not complained of chest pain, shortness of breath, cough, abdominal pain, nausea, vomiting or diarrhea.  Patient is a 46 y.o. female presenting with seizures. The history is provided by a relative. No language interpreter was used.  Seizures  This is a recurrent problem. The current episode started 12 to 24 hours ago. The problem has not changed since onset.There were 6 to 10 seizures. The most recent episode lasted less than 30 seconds. Associated symptoms include sleepiness, confusion and speech difficulty. Pertinent negatives include no visual disturbance, no neck stiffness, no sore throat, no chest pain, no cough, no nausea, no vomiting, no diarrhea and no muscle weakness. Characteristics include eye blinking, eye deviation, bladder incontinence and rhythmic jerking. Characteristics do not include bowel incontinence, bit tongue, apnea or cyanosis. The episode was witnessed. There was no sensation of an aura present. The seizures continued in the ED. The seizure(s) had no focality. Possible causes do not include med  or dosage change, missed seizure meds or recent illness. The maximum temperature recorded prior to her arrival was 100 to 100.9 F. Medications administered prior to arrival include lorazepam IV.    Past Medical History  Diagnosis Date  . Epilepsy   . Seizures   . Vertigo   . Constipation     Past Surgical History  Procedure Date  . Total hip arthroplasty   . Abdominal hysterectomy   . Colonoscopy   . Joint replacement     Family History  Problem Relation Age of Onset  . Cancer Mother   . Cancer Brother   . Diabetes Brother   . Cancer Maternal Aunt   . Diabetes Maternal Aunt     History  Substance Use Topics  . Smoking status: Never Smoker   . Smokeless tobacco: Not on file  . Alcohol Use: No    OB History    Grav Para Term Preterm Abortions TAB SAB Ect Mult Living                  Review of Systems  Unable to perform ROS: Other  HENT: Negative for sore throat.   Eyes: Negative for visual disturbance.  Respiratory: Negative for apnea and cough.   Cardiovascular: Negative for chest pain and cyanosis.  Gastrointestinal: Negative for nausea, vomiting, diarrhea and bowel incontinence.  Genitourinary: Positive for bladder incontinence.  Neurological: Positive for seizures and speech difficulty.  Psychiatric/Behavioral: Positive for confusion.    Allergies  Codeine; Ultram; Vicodin; Latex; and Penicillins  Home Medications   Current Outpatient Rx  Name Route Sig Dispense Refill  .  CLONAZEPAM 0.5 MG PO TABS Oral Take 0.5 mg by mouth at bedtime. For sleep    . IBUPROFEN 800 MG PO TABS Oral Take 800 mg by mouth every 8 (eight) hours as needed. For pain    . LACOSAMIDE 100 MG PO TABS Oral Take 150 mg by mouth 2 (two) times daily.    Marland Kitchen LACOSAMIDE 100 MG PO TABS Oral Take 150 mg by mouth 2 (two) times daily.    Marland Kitchen LAMOTRIGINE 200 MG PO TABS Oral Take 200 mg by mouth 2 (two) times daily.      Marland Kitchen PAROXETINE HCL 20 MG PO TABS Oral Take 20 mg by mouth at bedtime.         BP 124/74  Pulse 86  Temp(Src) 98.8 F (37.1 C) (Oral)  Resp 25  SpO2 97%  Physical Exam  Nursing note and vitals reviewed. Constitutional: She appears well-developed and well-nourished. She appears lethargic.       somnulent  HENT:  Head: Normocephalic and atraumatic.  Right Ear: External ear normal.  Left Ear: External ear normal.  Nose: Nose normal.  Mouth/Throat: Oropharynx is clear and moist. No oropharyngeal exudate.  Eyes: Conjunctivae are normal. Pupils are equal, round, and reactive to light. No scleral icterus.  Neck: Normal range of motion. Neck supple.  Cardiovascular: Regular rhythm and normal heart sounds.  Exam reveals no gallop and no friction rub.   No murmur heard.      Mildly tachycardic at 108  Pulmonary/Chest: Effort normal and breath sounds normal. No respiratory distress. She has no wheezes. She has no rales. She exhibits no tenderness.  Abdominal: Soft. Bowel sounds are normal. She exhibits no distension. There is no tenderness. There is no rebound and no guarding.  Musculoskeletal: Normal range of motion. She exhibits no edema and no tenderness.  Lymphadenopathy:    She has no cervical adenopathy.  Neurological: She appears lethargic. She displays no tremor. No cranial nerve deficit or sensory deficit. She displays no seizure activity. GCS eye subscore is 4. GCS verbal subscore is 4. GCS motor subscore is 6.  Skin: Skin is warm and dry. No rash noted. No erythema. No pallor.  Psychiatric: She has a normal mood and affect. Her behavior is normal. Judgment and thought content normal.    ED Course  Procedures (including critical care time)  Labs Reviewed  CBC - Abnormal; Notable for the following:    WBC 17.7 (*)    All other components within normal limits  DIFFERENTIAL - Abnormal; Notable for the following:    Neutrophils Relative 78 (*)    Neutro Abs 13.9 (*)    All other components within normal limits  BASIC METABOLIC PANEL - Abnormal;  Notable for the following:    Glucose, Bld 111 (*)    All other components within normal limits  URINALYSIS, ROUTINE W REFLEX MICROSCOPIC   Ct Head Wo Contrast  03/29/2011  *RADIOLOGY REPORT*  Clinical Data: Seizures.  Fall 2 days ago with seizures since. Altered mental status.  CT HEAD WITHOUT CONTRAST  Technique:  Contiguous axial images were obtained from the base of the skull through the vertex without contrast.  Comparison: CT angio head 12/29/2010.  Findings: No acute cortical infarct, hemorrhage, or mass lesion is present.  The ventricles are of normal size.  No significant extra- axial fluid collection is present.  The paranasal sinuses are clear.  The mastoids are sclerotic bilaterally, suggesting a history of prior disease. Alternately, this could be due to lack  of significant aeration.  IMPRESSION:  1.  Normal CT appearance of the brain. 2.  Evidence for chronic mastoid disease bilaterally.  Original Report Authenticated By: Jamesetta Orleans. MATTERN, M.D.     Status Epilepticus    MDM  As patient has not returned to her baseline, this is status epilepticus.  I have spoken with Dr. Lyman Speller who will admit the patient - we will load with 1400mg  of dilantin per his request.   Medical screening examination/treatment/procedure(s) were conducted as a shared visit with non-physician practitioner(s) and myself.  I personally evaluated the patient during the encounter Pt is 64 year woman with history of seizure who had several seizures today.  She was treated with IV ativan, and has had no seizures since.  Case was discussed with Dr. Lyman Speller, who recommended loading pt with Dilantin.  He recommended admitting her as she has remained very drowsy.  Case discussed with Dr. Della Goo, --> Admit to [facility].     Izola Price [place], [place] 03/29/11 1713  Carleene Cooper III, MD 03/29/11 [phone]

## 2011-03-29 NOTE — ED Notes (Signed)
Pt will verbalize sons name. Pt appears to be somewhat restless and attempting to turn onto side even after reminders to lay on back.

## 2011-03-29 NOTE — ED Notes (Signed)
Per son- pt had a 10 second seizure. States that she "tensed up and shook". Pt nods that she felt she had a seizure. NAD noted at this time.

## 2011-03-29 NOTE — Consult Note (Addendum)
Reason for Consult: "questionable seizure activity"  HPI: [name] is an 46 y.o. female who has a history of pseudoseizures and who is being treated by [name] for suspected epilepsy. Per husband, she had long-term monitoring at [facility], but the husband is unsure of the results. Dr. Imagene Gurney office is closed until Monday. Patient has been here in the past for similar complaints.   Past Medical History  Diagnosis Date  . Epilepsy   . Seizures   . Vertigo   . Constipation    Medications: I have reviewed the patient's current medications.  Past Surgical History  Procedure Date  . Total hip arthroplasty   . Abdominal hysterectomy   . Colonoscopy   . Joint replacement    Family History  Problem Relation Age of Onset  . Cancer Mother   . Cancer Brother   . Diabetes Brother   . Cancer Maternal Aunt   . Diabetes Maternal Aunt    Social History:  reports that she has never smoked. She does not have any smokeless tobacco history on file. She reports that she does not drink alcohol or use illicit drugs.  Allergies:  Allergies  Allergen Reactions  . Codeine     rash  . Ultram (Tramadol Hcl) Other (See Comments)    Reaction:Seizures  . Vicodin (Hydrocodone-Acetaminophen) Itching  . Latex Rash  . Penicillins Itching and Rash   ROS: as above  Blood pressure 124/74, pulse 86, temperature 98.8 F (37.1 C), temperature source Oral, resp. rate 25, SpO2 97.00%.  Neurological exam: No aphasia. Flat effect. Slow to answer, but answers appropriately. Cranial nerves: EOMI, PERRL. Blink to threat positive bilaterally. Sensation to V1 through V3 areas of the face was intact and symmetric throughout. There was no facial asymmetry. Hearing to finger rub was equal and symmetrical bilaterally. Shoulder shrug was 5/5 and symmetric bilaterally. Head rotation was 5/5 bilaterally. There was no dysarthria or palatal deviation. Motor: strength was grossly 5/5 throughout. Sensory: was intact  throughout to light touch. Coordination: finger-to-nose were intact and symmetric bilaterally. Reflexes: were 2+ in upper extremities and 1+ at the knees and 1+ at the ankles. Plantar response was downgoing bilaterally. Gait: deferred.  Results for orders placed during the hospital encounter of 03/29/11 (from the past 48 hour(s))  CBC     Status: Abnormal   Collection Time   03/29/11 12:18 PM      Component Value Range Comment   WBC 17.7 (*) 4.0 - 10.5 (K/uL)    RBC 4.40  3.87 - 5.11 (MIL/uL)    Hemoglobin 13.1  12.0 - 15.0 (g/dL)    HCT 16.1  09.6 - 04.5 (%)    MCV 87.0  78.0 - 100.0 (fL)    MCH 29.8  26.0 - 34.0 (pg)    MCHC 34.2  30.0 - 36.0 (g/dL)    RDW 40.9  81.1 - 91.4 (%)    Platelets 330  150 - 400 (K/uL)   DIFFERENTIAL     Status: Abnormal   Collection Time   03/29/11 12:18 PM      Component Value Range Comment   Neutrophils Relative 78 (*) 43 - 77 (%)    Neutro Abs 13.9 (*) 1.7 - 7.7 (K/uL)    Lymphocytes Relative 17  12 - 46 (%)    Lymphs Abs 3.0  0.7 - 4.0 (K/uL)    Monocytes Relative 5  3 - 12 (%)    Monocytes Absolute 0.8  0.1 - 1.0 (K/uL)  Eosinophils Relative 0  0 - 5 (%)    Eosinophils Absolute 0.0  0.0 - 0.7 (K/uL)    Basophils Relative 0  0 - 1 (%)    Basophils Absolute 0.0  0.0 - 0.1 (K/uL)   BASIC METABOLIC PANEL     Status: Abnormal   Collection Time   03/29/11 12:18 PM      Component Value Range Comment   Sodium 141  135 - 145 (mEq/L)    Potassium 3.8  3.5 - 5.1 (mEq/L)    Chloride 102  96 - 112 (mEq/L)    CO2 22  19 - 32 (mEq/L)    Glucose, Bld 111 (*) 70 - 99 (mg/dL)    BUN 11  6 - 23 (mg/dL)    Creatinine, Ser 1.61  0.50 - 1.10 (mg/dL)    Calcium 09.6  8.4 - 10.5 (mg/dL)    GFR calc non Af Amer >90  >90 (mL/min)    GFR calc Af Amer >90  >90 (mL/min)   URINALYSIS, ROUTINE W REFLEX MICROSCOPIC     Status: Normal   Collection Time   03/29/11  2:59 PM      Component Value Range Comment   Color, Urine YELLOW  YELLOW     APPearance CLEAR  CLEAR      Specific Gravity, Urine 1.017  1.005 - 1.030     pH 6.5  5.0 - 8.0     Glucose, UA NEGATIVE  NEGATIVE (mg/dL)    Hgb urine dipstick NEGATIVE  NEGATIVE     Bilirubin Urine NEGATIVE  NEGATIVE     Ketones, ur NEGATIVE  NEGATIVE (mg/dL)    Protein, ur NEGATIVE  NEGATIVE (mg/dL)    Urobilinogen, UA 0.2  0.0 - 1.0 (mg/dL)    Nitrite NEGATIVE  NEGATIVE     Leukocytes, UA NEGATIVE  NEGATIVE  MICROSCOPIC NOT DONE ON URINES WITH NEGATIVE PROTEIN, BLOOD, LEUKOCYTES, NITRITE, OR GLUCOSE <1000 mg/dL.   Ct Head Wo Contrast  03/29/2011  *RADIOLOGY REPORT*  Clinical Data: Seizures.  Fall 2 days ago with seizures since. Altered mental status.  CT HEAD WITHOUT CONTRAST  Technique:  Contiguous axial images were obtained from the base of the skull through the vertex without contrast.  Comparison: CT angio head 12/29/2010.  Findings: No acute cortical infarct, hemorrhage, or mass lesion is present.  The ventricles are of normal size.  No significant extra- axial fluid collection is present.  The paranasal sinuses are clear.  The mastoids are sclerotic bilaterally, suggesting a history of prior disease. Alternately, this could be due to lack of significant aeration.  IMPRESSION:  1.  Normal CT appearance of the brain. 2.  Evidence for chronic mastoid disease bilaterally.  Original Report Authenticated By: Jamesetta Orleans. MATTERN, M.D.   Assessment/Plan: 46 years old woman who came in with multiple episodes of shaking since yesterday and who has gotten Ativan in ED - I have not observed any of these events, but given history of pseudoseizures - there is high likelihood of psychosomatic cause for her symptoms. Patient should be admitted for overnight observation due to sedation with ativan. Recommend psychiatry consult as well. Cont. her anti-epileptic medications. Should follow-up with [name] next week. Call with questions.  Marisal Swarey 03/29/2011, 6:01 PM

## 2011-03-29 NOTE — ED Notes (Addendum)
Dr. Lyman Speller paged and call returned. States that he is not planning to admit pt and does not want dilantin to be given. Dr. Ignacia Palma notified. States that he will review chart and follow up.

## 2011-03-29 NOTE — ED Notes (Signed)
PA at bedside.

## 2011-03-30 LAB — BASIC METABOLIC PANEL
BUN: 10 mg/dL (ref 6–23)
CO2: 23 mEq/L (ref 19–32)
Calcium: 9 mg/dL (ref 8.4–10.5)
Chloride: 108 mEq/L (ref 96–112)
Creatinine, Ser: 0.57 mg/dL (ref 0.50–1.10)
GFR calc Af Amer: >90 mL/min (ref >90)
GFR calc non Af Amer: >90 mL/min (ref >90)
Glucose, Bld: 87 mg/dL (ref 70–99)
Potassium: 3.2 mEq/L — ABNORMAL LOW (ref 3.5–5.1)
Sodium: 143 mEq/L (ref 135–145)

## 2011-03-30 LAB — CBC
HCT: 35.3 % — ABNORMAL LOW (ref 36.0–46.0)
Hemoglobin: 11.8 g/dL — ABNORMAL LOW (ref 12.0–15.0)
MCH: 29.2 pg (ref 26.0–34.0)
MCHC: 33.4 g/dL (ref 30.0–36.0)
MCV: 87.4 fL (ref 78.0–100.0)
Platelets: 291 10*3/uL (ref 150–400)
RBC: 4.04 MIL/uL (ref 3.87–5.11)
RDW: 13.6 % (ref 11.5–15.5)
WBC: 7.1 10*3/uL (ref 4.0–10.5)

## 2011-03-30 LAB — PHENYTOIN LEVEL, TOTAL: Phenytoin Lvl: <2.5 ug/mL — ABNORMAL LOW (ref 10.0–20.0)

## 2011-03-30 LAB — ALBUMIN: Albumin: 3.8 g/dL (ref 3.5–5.2)

## 2011-03-30 MED ORDER — PAROXETINE HCL 20 MG PO TABS: 20.0000 mg | ORAL_TABLET | Freq: Every day | ORAL | Status: DC

## 2011-03-30 MED ORDER — PHENYTOIN SODIUM EXTENDED 100 MG PO CAPS: 100.0000 mg | ORAL_CAPSULE | Freq: Three times a day (TID) | ORAL | Status: DC

## 2011-03-30 NOTE — Discharge Instructions (Signed)
Seizures You had a seizure. About 2% of the population will have a seizure problem during their lifetime. Sometimes the cause for the seizure is not known. Seizures are usually associated with one of these problems:  Epilepsy.   Not taking your seizure medicine.   Alcohol and drug abuse.   Head injury, strokes, tumors, and brain surgery.   High fever and infections.   Low blood sugar.  Evaluating a new seizure disorder may require having a brain scan or a brain wave test called an EEG. If you have been given a seizure medicine, it is very important that you take it as prescribed. Not taking these medicines as directed is the most common cause of seizures. Blood tests are often used to be sure you are taking the proper dose.  Seizures cause many different symptoms, from convulsions to brief blackouts. Do not ride a bike, drive a car, go swimming, climb in high or dangerous places such as ladders or roofs, or operate any dangerous equipment until you have your doctor's permission. If you hold a driver's license, state law may require that a report be made to the motor vehicles department. You should wear an emergency medical identification bracelet with information about your seizures. If you have any warning that a seizure may occur, lie down in a safe place to protect yourself. Teach your family and friends what to do if you have any further seizures. They should stay calm and try to keep you from falling on hard or sharp objects. It is best not to try to restrain a seizing person or to force anything into his or her mouth. Do not try to open clenched jaws. When the seizure is over, the person should be rolled on their side to help drain any vomit or secretions from the mouth. After a seizure, a person may be confused or drowsy for several minutes. An ambulance should be called if the seizure lasted more than 5 minutes or if confusion remains for more than 30 minutes. Call your caregiver or the  emergency department for further instructions. Do not drive until cleared by your caregiver or neurologist!

## 2011-03-30 NOTE — Discharge Summary (Signed)
Physician Discharge Summary  Patient ID: [name] MRN: [id number] DOB/AGE: Oct 31, 1965 45 y.o.  Admit date: 03/29/2011 Discharge date: 03/30/2011  Primary Care Physician:  Sabino Dick, OTR   Discharge Diagnoses:   1-Seizure 2-anxiety 3-Insomnia  Medication List  As of 03/30/2011  9:40 AM   TAKE these medications         clonazePAM 0.5 MG tablet   Commonly known as: KLONOPIN   Take 0.5 mg by mouth at bedtime. For sleep      ibuprofen 800 MG tablet   Commonly known as: ADVIL,MOTRIN   Take 800 mg by mouth every 8 (eight) hours as needed. For pain      Lacosamide 100 MG Tabs   Take 150 mg by mouth 2 (two) times daily.      lamoTRIgine 200 MG tablet   Commonly known as: LAMICTAL   Take 200 mg by mouth 2 (two) times daily.      PARoxetine 20 MG tablet   Commonly known as: PAXIL   Take 1 tablet (20 mg total) by mouth at bedtime.      phenytoin 100 MG ER capsule   Commonly known as: DILANTIN   Take 1 capsule (100 mg total) by mouth 3 (three) times daily.             Disposition and Follow-up:  Patient discharge in stable and improved condition; no seizure activity seen in the hospital. Following neurology recommendations, patient started on dilantin TID 100mg s and will follow with [name] for further recommendations. She was advised to take medications as prescribed, to follow discharge instructions and to also arrange follow up with PCP in 2 weeks.   Consults:  Neurology   Significant Diagnostic Studies:  Ct Head Wo Contrast  03/29/2011  *RADIOLOGY REPORT*  Clinical Data: Seizures.  Fall 2 days ago with seizures since. Altered mental status.  CT HEAD WITHOUT CONTRAST  Technique:  Contiguous axial images were obtained from the base of the skull through the vertex without contrast.  Comparison: CT angio head 12/29/2010.  Findings: No acute cortical infarct, hemorrhage, or mass lesion is present.  The ventricles are of normal size.  No significant extra-  axial fluid collection is present.  The paranasal sinuses are clear.  The mastoids are sclerotic bilaterally, suggesting a history of prior disease. Alternately, this could be due to lack of significant aeration.  IMPRESSION:  1.  Normal CT appearance of the brain. 2.  Evidence for chronic mastoid disease bilaterally.  Original Report Authenticated By: Jamesetta Orleans. MATTERN, M.D.     Brief H and P: For complete details please refer to admission H and P, but in brief 46 y/o with hx of anxiety and ?seizure disorders; came tot he hospital after experiencing > 8 episodes of generalized tonic clonic seizure activity at home.    Hospital Course:  1-Seizure: Per neurology recommendations, continue home antiepileptics and start dilantin TID until she follow with [name] for further evaluation and treatment. 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 No further seizure activity seen during hospitalization. At discharge patient AAOX3 and in no acute distress.  2-anxiety/insomnia: continue paxil and clonazepam as previously instructed.  Rest of her medical problems remains stable and no changes to her regimen has been made.  Time spent on Discharge: 40 minutes  Signed: [name] 03/30/2011, 9:40 AM

## 2011-03-31 NOTE — Progress Notes (Signed)
Utilization Review Completed.[name] T3/18/2013   

## 2011-04-23 ENCOUNTER — Emergency Department (HOSPITAL_COMMUNITY)
Admission: EM | Admit: 2011-04-23 | Discharge: 2011-04-24 | Disposition: A | Discharge status: Home / Self Care | Payer: Medicare Other | Attending: Emergency Medicine | Admitting: Emergency Medicine

## 2011-04-23 ENCOUNTER — Encounter (HOSPITAL_COMMUNITY): Payer: Self-pay | Admitting: Radiology

## 2011-04-23 ENCOUNTER — Emergency Department (HOSPITAL_COMMUNITY): Payer: Medicare Other

## 2011-04-23 DIAGNOSIS — Z79899 Other long term (current) drug therapy: Secondary | ICD-10-CM | POA: Insufficient documentation

## 2011-04-23 DIAGNOSIS — S0003XA Contusion of scalp, initial encounter: Secondary | ICD-10-CM | POA: Insufficient documentation

## 2011-04-23 DIAGNOSIS — M542 Cervicalgia: Secondary | ICD-10-CM | POA: Insufficient documentation

## 2011-04-23 DIAGNOSIS — Y92009 Unspecified place in unspecified non-institutional (private) residence as the place of occurrence of the external cause: Secondary | ICD-10-CM | POA: Insufficient documentation

## 2011-04-23 DIAGNOSIS — W010XXA Fall on same level from slipping, tripping and stumbling without subsequent striking against object, initial encounter: Secondary | ICD-10-CM | POA: Insufficient documentation

## 2011-04-23 DIAGNOSIS — G40909 Epilepsy, unspecified, not intractable, without status epilepticus: Secondary | ICD-10-CM | POA: Insufficient documentation

## 2011-04-23 DIAGNOSIS — S0990XA Unspecified injury of head, initial encounter: Secondary | ICD-10-CM

## 2011-04-23 NOTE — ED Notes (Signed)
States she stepped off the porch and fell hitting head on gravel driveway, brief period of unconsciousness

## 2011-04-24 ENCOUNTER — Encounter (HOSPITAL_COMMUNITY): Payer: Self-pay

## 2011-04-24 MED ORDER — IBUPROFEN 800 MG PO TABS
800.0000 mg | ORAL_TABLET | Freq: Once | ORAL | Status: AC
  Administered 2011-04-24: 800 mg via ORAL
  Filled 2011-04-24: qty 1

## 2011-04-24 NOTE — Discharge Instructions (Signed)
Contusion A contusion is a deep bruise. Contusions are the result of an injury that caused bleeding under the skin. The contusion may turn blue, purple, or yellow. Minor injuries will give you a painless contusion, but more severe contusions may stay painful and swollen for a few weeks.  CAUSES  A contusion is usually caused by a blow, trauma, or direct force to an area of the body. SYMPTOMS   Swelling and redness of the injured area.   Bruising of the injured area.   Tenderness and soreness of the injured area.   Pain.  DIAGNOSIS  The diagnosis can be made by taking a history and physical exam. An X-ray, CT scan, or MRI may be needed to determine if there were any associated injuries, such as fractures. TREATMENT  Specific treatment will depend on what area of the body was injured. In general, the best treatment for a contusion is resting, icing, elevating, and applying cold compresses to the injured area. Over-the-counter medicines may also be recommended for pain control. Ask your caregiver what the best treatment is for your contusion. HOME CARE INSTRUCTIONS   Put ice on the injured area.   Put ice in a plastic bag.   Place a towel between your skin and the bag.   Leave the ice on for 15 to 20 minutes, 3 to 4 times a day.   Only take over-the-counter or prescription medicines for pain, discomfort, or fever as directed by your caregiver. Your caregiver may recommend avoiding anti-inflammatory medicines (aspirin, ibuprofen, and naproxen) for 48 hours because these medicines may increase bruising.   Rest the injured area.   If possible, elevate the injured area to reduce swelling.  SEEK IMMEDIATE MEDICAL CARE IF:   You have increased bruising or swelling.   You have pain that is getting worse.   Your swelling or pain is not relieved with medicines.  MAKE SURE YOU:   Understand these instructions.   Will watch your condition.   Will get help right away if you are not  doing well or get worse.  Document Released: 10/09/2004 Document Revised: 12/19/2010 Document Reviewed: 11/04/2010 [employer] Patient Information 2012 [employer].Head Injury, Adult You have had a head injury that does not appear serious at this time. A concussion is a state of changed mental ability, usually from a blow to the head. You should take clear liquids for the rest of the day and then resume your regular diet. You should not take sedatives or alcoholic beverages for as long as directed by your caregiver after discharge. After injuries such as yours, most problems occur within the first 24 hours. SYMPTOMS These minor symptoms may be experienced after discharge:  Memory difficulties.   Dizziness.   Headaches.   Double vision.   Hearing difficulties.   Depression.   Tiredness.   Weakness.   Difficulty with concentration.  If you experience any of these problems, you should not be alarmed. A concussion requires a few days for recovery. Many patients with head injuries frequently experience such symptoms. Usually, these problems disappear without medical care. If symptoms last for more than one day, notify your caregiver. See your caregiver sooner if symptoms are becoming worse rather than better. HOME CARE INSTRUCTIONS   During the next 24 hours you must stay with someone who can watch you for the warning signs listed below.  Although it is unlikely that serious side effects will occur, you should be aware of signs and symptoms which may necessitate your   return to this location. Side effects may occur up to 7 - 10 days following the injury. It is important for you to carefully monitor your condition and contact your caregiver or seek immediate medical attention if there is a change in your condition. SEEK IMMEDIATE MEDICAL CARE IF:   There is confusion or drowsiness.   You can not awaken the injured person.   There is nausea (feeling sick to your stomach) or continued,  forceful vomiting.   You notice dizziness or unsteadiness which is getting worse, or inability to walk.   You have convulsions or unconsciousness.   You experience severe, persistent headaches not relieved by over-the-counter or prescription medicines for pain. (Do not take aspirin as this impairs clotting abilities). Take other pain medications only as directed.   You can not use arms or legs normally.   There is clear or bloody discharge from the nose or ears.  MAKE SURE YOU:   Understand these instructions.   Will watch your condition.   Will get help right away if you are not doing well or get worse.  Document Released: 12/30/2004 Document Revised: 12/19/2010 Document Reviewed: 11/17/2008 [facility] Patient Information 2012 [place], [place].Cryotherapy Cryotherapy means treatment with cold. Ice or gel packs can be used to reduce both pain and swelling. Ice is the most helpful within the first 24 to 48 hours after an injury or flareup from overusing a muscle or joint. Sprains, strains, spasms, burning pain, shooting pain, and aches can all be eased with ice. Ice can also be used when recovering from surgery. Ice is effective, has very few side effects, and is safe for most people to use. PRECAUTIONS  Ice is not a safe treatment option for people with:  Raynaud's phenomenon. This is a condition affecting small blood vessels in the extremities. Exposure to cold may cause your problems to return.   Cold hypersensitivity. There are many forms of cold hypersensitivity, including:   Cold urticaria. Red, itchy hives appear on the skin when the tissues begin to warm after being iced.   Cold erythema. This is a red, itchy rash caused by exposure to cold.   Cold hemoglobinuria. Red blood cells break down when the tissues begin to warm after being iced. The hemoglobin that carry oxygen are passed into the urine because they cannot combine with blood proteins fast enough.   Numbness or altered  sensitivity in the area being iced.  If you have any of the following conditions, do not use ice until you have discussed cryotherapy with your caregiver:  Heart conditions, such as arrhythmia, angina, or chronic heart disease.   High blood pressure.   Healing wounds or open skin in the area being iced.   Current infections.   Rheumatoid arthritis.   Poor circulation.   Diabetes.  Ice slows the blood flow in the region it is applied. This is beneficial when trying to stop inflamed tissues from spreading irritating chemicals to surrounding tissues. However, if you expose your skin to cold temperatures for too long or without the proper protection, you can damage your skin or nerves. Watch for signs of skin damage due to cold. HOME CARE INSTRUCTIONS Follow these tips to use ice and cold packs safely.  Place a dry or damp towel between the ice and skin. A damp towel will cool the skin more quickly, so you may need to shorten the time that the ice is used.   For a more rapid response, add gentle compression to the  ice.   Ice for no more than 10 to 20 minutes at a time. The bonier the area you are icing, the less time it will take to get the benefits of ice.   Check your skin after 5 minutes to make sure there are no signs of a poor response to cold or skin damage.   Rest 20 minutes or more in between uses.   Once your skin is numb, you can end your treatment. You can test numbness by very lightly touching your skin. The touch should be so light that you do not see the skin dimple from the pressure of your fingertip. When using ice, most people will feel these normal sensations in this order: cold, burning, aching, and numbness.   Do not use ice on someone who cannot communicate their responses to pain, such as small children or people with dementia.  HOW TO MAKE AN ICE PACK Ice packs are the most common way to use ice therapy. Other methods include ice massage, ice baths, and  cryo-sprays. Muscle creams that cause a cold, tingly feeling do not offer the same benefits that ice offers and should not be used as a substitute unless recommended by your caregiver. To make an ice pack, do one of the following:  Place crushed ice or a bag of frozen vegetables in a sealable plastic bag. Squeeze out the excess air. Place this bag inside another plastic bag. Slide the bag into a pillowcase or place a damp towel between your skin and the bag.   Mix 3 parts water with 1 part rubbing alcohol. Freeze the mixture in a sealable plastic bag. When you remove the mixture from the freezer, it will be slushy. Squeeze out the excess air. Place this bag inside another plastic bag. Slide the bag into a pillowcase or place a damp towel between your skin and the bag.  SEEK MEDICAL CARE IF:  You develop white spots on your skin. This may give the skin a blotchy (mottled) appearance.   Your skin turns blue or pale.   Your skin becomes waxy or hard.   Your swelling gets worse.  MAKE SURE YOU:   Understand these instructions.   Will watch your condition.   Will get help right away if you are not doing well or get worse.  Document Released: 08/26/2010 Document Revised: 12/19/2010 Document Reviewed: 08/26/2010 [facility] Patient Information 2012 [place], [place].  Take the ibuprofen if needed for pain.  Return to the ED if any change in level of consciousness, behavior or coordination.

## 2011-04-24 NOTE — ED Provider Notes (Signed)
History     CSN: [id number]  Arrival date & time 04/23/11  2026   First MD Initiated Contact with Patient 04/23/11 2111      Chief Complaint  Patient presents with  . Head Injury    (Consider location/radiation/quality/duration/timing/severity/associated sxs/prior treatment) HPI Comments: Pt missed a low step and tripped and fell on her patio.  Struck back of head.  Husband states she was unconscious for about 1 minute.  She has seizures and recently had her dilantin dose increased by [name].  She may have had a brief seizure this PM.  Patient is a 46 y.o. female presenting with head injury. The history is provided by the patient. No language interpreter was used.  Head Injury  The incident occurred 3 to 5 hours ago. She came to the ER via EMS. The injury mechanism was a direct blow. She lost consciousness for a period of less than one minute. There was no blood loss. The quality of the pain is described as sharp. The pain is moderate. The pain has been constant since the injury. Pertinent negatives include no blurred vision, no vomiting, no disorientation and no memory loss. She was found conscious and alert by EMS personnel.    Past Medical History  Diagnosis Date  . Epilepsy   . Seizures   . Vertigo   . Constipation     Past Surgical History  Procedure Date  . Total hip arthroplasty   . Abdominal hysterectomy   . Colonoscopy   . Joint replacement     Family History  Problem Relation Age of Onset  . Cancer Mother   . Cancer Brother   . Diabetes Brother   . Cancer Maternal Aunt   . Diabetes Maternal Aunt     History  Substance Use Topics  . Smoking status: Never Smoker   . Smokeless tobacco: Not on file  . Alcohol Use: No    OB History    Grav Para Term Preterm Abortions TAB SAB Ect Mult Living                  Review of Systems  HENT: Positive for neck pain.   Eyes: Negative for blurred vision.  Gastrointestinal: Negative for vomiting.    Neurological: Positive for seizures and headaches. Negative for speech difficulty.  Psychiatric/Behavioral: Negative for memory loss.  All other systems reviewed and are negative.    Allergies  Benadryl; Codeine; Melatonin; Ultram; Vicodin; Latex; and Penicillins  Home Medications   Current Outpatient Rx  Name Route Sig Dispense Refill  . CLONAZEPAM 0.5 MG PO TABS Oral Take 0.5 mg by mouth at bedtime. For sleep    . IBUPROFEN 800 MG PO TABS Oral Take 800 mg by mouth every 8 (eight) hours as needed. For pain    . LACOSAMIDE 100 MG PO TABS Oral Take 150 mg by mouth 2 (two) times daily.    Marland Kitchen LAMOTRIGINE 200 MG PO TABS Oral Take 200 mg by mouth 2 (two) times daily.      Marland Kitchen PAROXETINE HCL 20 MG PO TABS Oral Take 1 tablet (20 mg total) by mouth at bedtime. 30 tablet 0  . PHENYTOIN SODIUM EXTENDED 100 MG PO CAPS Oral Take 1 capsule (100 mg total) by mouth 3 (three) times daily. 90 capsule 0    BP 114/75  Pulse 84  Temp(Src) 97.9 F (36.6 C) (Oral)  Resp 16  Ht 5' (1.524 m)  Wt 170 lb (77.111 kg)  BMI 33.20 kg/m2  SpO2 99%  Physical Exam  Nursing note and vitals reviewed. Constitutional: She is oriented to person, place, and time. She appears well-developed and well-nourished. No distress.  HENT:  Head: Normocephalic. Head is without raccoon's eyes and without Battle's sign.    Eyes: EOM are normal. Pupils are equal, round, and reactive to light.  Neck: Normal range of motion.  Cardiovascular: Normal rate, regular rhythm and normal heart sounds.   Pulmonary/Chest: Effort normal and breath sounds normal.  Abdominal: Soft. She exhibits no distension. There is no tenderness.  Musculoskeletal: Normal range of motion.  Neurological: She is alert and oriented to person, place, and time. She has normal strength. No cranial nerve deficit or sensory deficit. Coordination and gait normal. GCS eye subscore is 4. GCS verbal subscore is 5. GCS motor subscore is 6.       Pt alert.  Asks for  ibuprofen for headache.  Skin: Skin is warm and dry.  Psychiatric: She has a normal mood and affect. Judgment normal.    ED Course  Procedures (including critical care time)  Labs Reviewed - No data to display Ct Head Wo Contrast  04/23/2011  *RADIOLOGY REPORT*  Clinical Data:  Status post fall over trash can; hit posterior head and neck.  Posterior head and neck pain.  CT HEAD WITHOUT CONTRAST AND CT CERVICAL SPINE WITHOUT CONTRAST  Technique:  Multidetector CT imaging of the head and cervical spine was performed following the standard protocol without intravenous contrast.  Multiplanar CT image reconstructions of the cervical spine were also generated.  Comparison: CT of the head performed 03/29/2011, CTA of the head and neck performed 12/29/2010, and MRI of the brain performed 12/29/2010  CT HEAD  Findings: There is no evidence of acute infarction, mass lesion, or intra- or extra-axial hemorrhage on CT.  The posterior fossa, including the cerebellum, brainstem and fourth ventricle, is within normal limits.  The third and lateral ventricles, and basal ganglia are unremarkable in appearance.  The cerebral hemispheres are symmetric in appearance, with normal gray- white differentiation.  No mass effect or midline shift is seen.  There is no evidence of fracture; mild developmental fragmentation is noted at the base of the skull.  The orbits are within normal limits.  The paranasal sinuses and mastoid air cells are well- aerated.  No significant soft tissue abnormalities are seen.  IMPRESSION: No evidence of traumatic intracranial injury or fracture.  CT CERVICAL SPINE  Findings: There is no evidence of fracture or subluxation. Vertebral bodies demonstrate normal height and alignment.  There is slight narrowing of the intervertebral disc space at C6-C7, with associated anterior and posterior disc osteophyte complexes. Prevertebral soft tissues are within normal limits.  The visualized neural foramina are  grossly unremarkable.  The thyroid gland is unremarkable in appearance.  The minimally visualized right lung apex appears grossly clear.  Numerous scattered calcifications are seen within the left parotid gland.  IMPRESSION:  1.  No evidence of fracture or subluxation along the cervical spine. 2.  Minimal degenerative change at the lower cervical spine. 3.  Numerous scattered calcifications within the left parotid gland.  Original Report Authenticated By: Tonia Ghent, M.D.   Ct Cervical Spine Wo Contrast  04/23/2011  *RADIOLOGY REPORT*  Clinical Data:  Status post fall over trash can; hit posterior head and neck.  Posterior head and neck pain.  CT HEAD WITHOUT CONTRAST AND CT CERVICAL SPINE WITHOUT CONTRAST  Technique:  Multidetector CT imaging of the head and cervical spine was  performed following the standard protocol without intravenous contrast.  Multiplanar CT image reconstructions of the cervical spine were also generated.  Comparison: CT of the head performed 03/29/2011, CTA of the head and neck performed 12/29/2010, and MRI of the brain performed 12/29/2010  CT HEAD  Findings: There is no evidence of acute infarction, mass lesion, or intra- or extra-axial hemorrhage on CT.  The posterior fossa, including the cerebellum, brainstem and fourth ventricle, is within normal limits.  The third and lateral ventricles, and basal ganglia are unremarkable in appearance.  The cerebral hemispheres are symmetric in appearance, with normal gray- white differentiation.  No mass effect or midline shift is seen.  There is no evidence of fracture; mild developmental fragmentation is noted at the base of the skull.  The orbits are within normal limits.  The paranasal sinuses and mastoid air cells are well- aerated.  No significant soft tissue abnormalities are seen.  IMPRESSION: No evidence of traumatic intracranial injury or fracture.  CT CERVICAL SPINE  Findings: There is no evidence of fracture or subluxation. Vertebral  bodies demonstrate normal height and alignment.  There is slight narrowing of the intervertebral disc space at C6-C7, with associated anterior and posterior disc osteophyte complexes. Prevertebral soft tissues are within normal limits.  The visualized neural foramina are grossly unremarkable.  The thyroid gland is unremarkable in appearance.  The minimally visualized right lung apex appears grossly clear.  Numerous scattered calcifications are seen within the left parotid gland.  IMPRESSION:  1.  No evidence of fracture or subluxation along the cervical spine. 2.  Minimal degenerative change at the lower cervical spine. 3.  Numerous scattered calcifications within the left parotid gland.  Original Report Authenticated By: Tonia Ghent, M.D.     1. Scalp contusion   2. Head injury       MDM  Ice Ibuprofen 800 mg TID Return if change in LOC, behavior or coordination         Worthy Rancher, [place] 04/24/11 0013  Worthy Rancher, PA 04/24/11 [phone]

## 2011-04-26 NOTE — ED Provider Notes (Signed)
Medical screening examination/treatment/procedure(s) were performed by non-physician practitioner and as supervising physician I was immediately available for consultation/collaboration.  Nicoletta Dress. Colon Branch, MD 04/26/11 [phone]

## 2011-05-24 ENCOUNTER — Inpatient Hospital Stay (HOSPITAL_BASED_OUTPATIENT_CLINIC_OR_DEPARTMENT_OTHER)
Admission: EM | Admit: 2011-05-24 | Discharge: 2011-05-28 | DRG: 101 | Disposition: A | Discharge status: Home / Self Care | Payer: Medicare Other | Source: Ambulatory Visit | Attending: Internal Medicine | Admitting: Internal Medicine

## 2011-05-24 ENCOUNTER — Emergency Department (INDEPENDENT_AMBULATORY_CARE_PROVIDER_SITE_OTHER): Payer: Medicare Other

## 2011-05-24 ENCOUNTER — Encounter (HOSPITAL_BASED_OUTPATIENT_CLINIC_OR_DEPARTMENT_OTHER): Payer: Self-pay | Admitting: *Deleted

## 2011-05-24 DIAGNOSIS — R519 Headache, unspecified: Secondary | ICD-10-CM | POA: Diagnosis present

## 2011-05-24 DIAGNOSIS — R51 Headache: Secondary | ICD-10-CM | POA: Diagnosis present

## 2011-05-24 DIAGNOSIS — R569 Unspecified convulsions: Secondary | ICD-10-CM

## 2011-05-24 DIAGNOSIS — R945 Abnormal results of liver function studies: Secondary | ICD-10-CM | POA: Diagnosis present

## 2011-05-24 DIAGNOSIS — W19XXXA Unspecified fall, initial encounter: Secondary | ICD-10-CM

## 2011-05-24 DIAGNOSIS — T50905A Adverse effect of unspecified drugs, medicaments and biological substances, initial encounter: Secondary | ICD-10-CM | POA: Diagnosis present

## 2011-05-24 DIAGNOSIS — Z79899 Other long term (current) drug therapy: Secondary | ICD-10-CM

## 2011-05-24 DIAGNOSIS — K5900 Constipation, unspecified: Secondary | ICD-10-CM | POA: Diagnosis present

## 2011-05-24 DIAGNOSIS — M25559 Pain in unspecified hip: Secondary | ICD-10-CM

## 2011-05-24 DIAGNOSIS — H8110 Benign paroxysmal vertigo, unspecified ear: Secondary | ICD-10-CM

## 2011-05-24 DIAGNOSIS — T420X5A Adverse effect of hydantoin derivatives, initial encounter: Secondary | ICD-10-CM | POA: Diagnosis present

## 2011-05-24 DIAGNOSIS — R7989 Other specified abnormal findings of blood chemistry: Secondary | ICD-10-CM | POA: Diagnosis present

## 2011-05-24 DIAGNOSIS — K759 Inflammatory liver disease, unspecified: Secondary | ICD-10-CM | POA: Diagnosis present

## 2011-05-24 DIAGNOSIS — M25579 Pain in unspecified ankle and joints of unspecified foot: Secondary | ICD-10-CM

## 2011-05-24 DIAGNOSIS — K716 Toxic liver disease with hepatitis, not elsewhere classified: Secondary | ICD-10-CM | POA: Diagnosis present

## 2011-05-24 DIAGNOSIS — G40909 Epilepsy, unspecified, not intractable, without status epilepticus: Principal | ICD-10-CM | POA: Diagnosis present

## 2011-05-24 DIAGNOSIS — D72829 Elevated white blood cell count, unspecified: Secondary | ICD-10-CM | POA: Diagnosis present

## 2011-05-24 DIAGNOSIS — Z96649 Presence of unspecified artificial hip joint: Secondary | ICD-10-CM

## 2011-05-24 LAB — COMPREHENSIVE METABOLIC PANEL
ALT: 556 U/L — ABNORMAL HIGH (ref 0–35)
AST: 987 U/L — ABNORMAL HIGH (ref 0–37)
Albumin: 4.3 g/dL (ref 3.5–5.2)
Alkaline Phosphatase: 100 U/L (ref 39–117)
BUN: 9 mg/dL (ref 6–23)
CO2: 21 mEq/L (ref 19–32)
Calcium: 9.4 mg/dL (ref 8.4–10.5)
Chloride: 101 mEq/L (ref 96–112)
Creatinine, Ser: 0.5 mg/dL (ref 0.50–1.10)
GFR calc Af Amer: >90 mL/min (ref >90)
GFR calc non Af Amer: >90 mL/min (ref >90)
Glucose, Bld: 99 mg/dL (ref 70–99)
Potassium: 3.5 mEq/L (ref 3.5–5.1)
Sodium: 137 mEq/L (ref 135–145)
Total Bilirubin: 0.3 mg/dL (ref 0.3–1.2)
Total Protein: 6.6 g/dL (ref 6.0–8.3)

## 2011-05-24 LAB — URINALYSIS, ROUTINE W REFLEX MICROSCOPIC
Bilirubin Urine: NEGATIVE
Glucose, UA: NEGATIVE mg/dL
Hgb urine dipstick: NEGATIVE
Ketones, ur: NEGATIVE mg/dL
Leukocytes, UA: NEGATIVE
Nitrite: NEGATIVE
Protein, ur: NEGATIVE mg/dL
Specific Gravity, Urine: 1.015 (ref 1.005–1.030)
Urobilinogen, UA: 0.2 mg/dL (ref 0.0–1.0)
pH: 6 (ref 5.0–8.0)

## 2011-05-24 LAB — CBC
HCT: 33.8 % — ABNORMAL LOW (ref 36.0–46.0)
Hemoglobin: 11.8 g/dL — ABNORMAL LOW (ref 12.0–15.0)
MCH: 29.3 pg (ref 26.0–34.0)
MCHC: 34.9 g/dL (ref 30.0–36.0)
MCV: 83.9 fL (ref 78.0–100.0)
Platelets: 253 10*3/uL (ref 150–400)
RBC: 4.03 MIL/uL (ref 3.87–5.11)
RDW: 13 % (ref 11.5–15.5)
WBC: 12.5 10*3/uL — ABNORMAL HIGH (ref 4.0–10.5)

## 2011-05-24 LAB — PROTIME-INR
INR: 1.11 (ref 0.00–1.49)
Prothrombin Time: 14.5 seconds (ref 11.6–15.2)

## 2011-05-24 LAB — AMMONIA: Ammonia: 45 umol/L (ref 11–60)

## 2011-05-24 LAB — DIFFERENTIAL
Basophils Absolute: 0 10*3/uL (ref 0.0–0.1)
Basophils Relative: 0 % (ref 0–1)
Eosinophils Absolute: 0 10*3/uL (ref 0.0–0.7)
Eosinophils Relative: 0 % (ref 0–5)
Lymphocytes Relative: 12 % (ref 12–46)
Lymphs Abs: 1.5 10*3/uL (ref 0.7–4.0)
Monocytes Absolute: 0.6 10*3/uL (ref 0.1–1.0)
Monocytes Relative: 5 % (ref 3–12)
Neutro Abs: 10.3 10*3/uL — ABNORMAL HIGH (ref 1.7–7.7)
Neutrophils Relative %: 83 % — ABNORMAL HIGH (ref 43–77)

## 2011-05-24 LAB — PHENYTOIN LEVEL, TOTAL: Phenytoin Lvl: 7.6 ug/mL — ABNORMAL LOW (ref 10.0–20.0)

## 2011-05-24 LAB — APTT: aPTT: 32 seconds (ref 24–37)

## 2011-05-24 LAB — TROPONIN I: Troponin I: <0.3 ng/mL (ref <0.30)

## 2011-05-24 MED ORDER — SODIUM CHLORIDE 0.9 % IV SOLN
Freq: Once | INTRAVENOUS | Status: AC
  Administered 2011-05-24: 20:00:00 via INTRAVENOUS

## 2011-05-24 MED ORDER — GI COCKTAIL ~~LOC~~: 30.0000 mL | Freq: Once | ORAL | Status: DC

## 2011-05-24 MED ORDER — PANTOPRAZOLE SODIUM 40 MG IV SOLR: 40.0000 mg | Freq: Once | INTRAVENOUS | Status: DC

## 2011-05-24 MED ORDER — ONDANSETRON HCL 4 MG/2ML IJ SOLN
4.0000 mg | Freq: Once | INTRAMUSCULAR | Status: AC
  Administered 2011-05-24: 4 mg via INTRAVENOUS
  Filled 2011-05-24: qty 2

## 2011-05-24 NOTE — ED Notes (Signed)
Pt states she has felt sick all morning. ?had seizure and has felt worse since. C/O low abd and back pain. +nausea. Vomited x 1 enroute.

## 2011-05-24 NOTE — ED Notes (Signed)
Called to room because pt's husband said she had a seizure, but pt was alert upon my entering room and said she just had the shakes "cold chills" VSS

## 2011-05-24 NOTE — ED Provider Notes (Signed)
History   This chart was scribed for Hilario Quarry, MD by Shari Heritage. The patient was seen in room MH07/MH07. Patient's care was started at 1819.  CSN: [id number]  Arrival date & time 05/24/11  1819   First MD Initiated Contact with Patient 05/24/11 1849      Chief Complaint  Patient presents with  . Abdominal Pain   The history is provided by the patient and the spouse. A language interpreter was used.   [name] is a 46 y.o. female who presents to the Emergency Department complaining of moderate to severe, constant nausea associated with abdominal pain, vomiting, diarrhea, and enuresis. Patient hasn't been exposed to other individuals with these symptoms. Patient vomited on the way to the ED. Patient did not mention any relieving factors.  Patient also experienced a seizure earlier this morning that resulted in a fall. Patient fell on her hip and complains of moderate to severe hip pain that is worse with palpation and pain on the lateral aspect of her right ankle. Patient also claims she had another seizure at 4:00PM today. Patient's last seizure prior to today was 3 days ago. Patient says that her seizures occur at least once a week. Patient takes Dilantin regularly. Patient is ambulatory. Patient with h/o of left hip replacement, epilepsy, seizures and vertigo.  PCP - Tania Ade ([facility]) Neurologist - Love  Past Medical History  Diagnosis Date  . Epilepsy   . Seizures   . Vertigo   . Constipation     Past Surgical History  Procedure Date  . Total hip arthroplasty   . Abdominal hysterectomy   . Colonoscopy   . Joint replacement     Family History  Problem Relation Age of Onset  . Cancer Mother   . Cancer Brother   . Diabetes Brother   . Cancer Maternal Aunt   . Diabetes Maternal Aunt     History  Substance Use Topics  . Smoking status: Never Smoker   . Smokeless tobacco: Not on file  . Alcohol Use: No    OB History    Grav Para  Term Preterm Abortions TAB SAB Ect Mult Living                  Review of Systems  Constitutional: Negative for fever and chills.  Gastrointestinal: Positive for nausea, vomiting, abdominal pain and diarrhea.  Genitourinary: Positive for enuresis.  Neurological: Positive for seizures. Negative for syncope and speech difficulty.  Patient is positive for pain in hip and in right ankle.  Allergies  Benadryl; Codeine; Melatonin; Ultram; Vicodin; Latex; and Penicillins  Home Medications   Current Outpatient Rx  Name Route Sig Dispense Refill  . CLONAZEPAM 0.5 MG PO TABS Oral Take 0.5 mg by mouth at bedtime. For sleep    . LACOSAMIDE 100 MG PO TABS Oral Take 150 mg by mouth 2 (two) times daily.    Marland Kitchen LAMOTRIGINE 200 MG PO TABS Oral Take 200 mg by mouth 2 (two) times daily.      Marland Kitchen PAROXETINE HCL 20 MG PO TABS Oral Take 1 tablet (20 mg total) by mouth at bedtime. 30 tablet 0  . PHENYTOIN SODIUM EXTENDED 100 MG PO CAPS Oral Take 1 capsule (100 mg total) by mouth 3 (three) times daily. 90 capsule 0  . IBUPROFEN 800 MG PO TABS Oral Take 800 mg by mouth every 8 (eight) hours as needed. For pain      BP 112/77  Pulse 88  Temp(Src) 98.9 F (37.2 C) (Oral)  Resp 20  Ht 5' (1.524 m)  Wt 160 lb (72.576 kg)  BMI 31.25 kg/m2  SpO2 100%  Physical Exam  Nursing note and vitals reviewed. Constitutional: She is oriented to person, place, and time. She appears well-developed and well-nourished.  HENT:  Head: Normocephalic and atraumatic.  Eyes: Conjunctivae and EOM are normal. Pupils are equal, round, and reactive to light.  Neck: Normal range of motion. Neck supple.  Cardiovascular: Normal rate and regular rhythm.   Pulmonary/Chest: Effort normal and breath sounds normal.  Abdominal: Soft. Bowel sounds are normal. There is tenderness (Tenderness on right hip that worsens with palapation).  Musculoskeletal: Normal range of motion. She exhibits tenderness (Lateral aspect of right ankle).    Neurological: She is alert and oriented to person, place, and time.  Skin: Skin is warm and dry.  Psychiatric: She has a normal mood and affect.    ED Course  Procedures (including critical care time) DIAGNOSTIC STUDIES: Oxygen Saturation is 100% on room air, normal by my interpretation.    COORDINATION OF CARE: 7:10PM - Patient informed of current plan for treatment and evaluation and agrees with plan at this time. Will order labs and X-[name] of right hip and right ankle.   Labs Reviewed  URINALYSIS, ROUTINE W REFLEX MICROSCOPIC - Abnormal; Notable for the following:    APPearance CLOUDY (*)    All other components within normal limits  CBC - Abnormal; Notable for the following:    WBC 12.5 (*)    Hemoglobin 11.8 (*)    HCT 33.8 (*)    All other components within normal limits  DIFFERENTIAL - Abnormal; Notable for the following:    Neutrophils Relative 83 (*)    Neutro Abs 10.3 (*)    All other components within normal limits  COMPREHENSIVE METABOLIC PANEL - Abnormal; Notable for the following:    AST 987 (*)    ALT 556 (*)    All other components within normal limits  PHENYTOIN LEVEL, TOTAL - Abnormal; Notable for the following:    Phenytoin Lvl 7.6 (*)    All other components within normal limits  PROTIME-INR  APTT  AMMONIA  TROPONIN I   Dg Hip Complete Right  05/24/2011  *RADIOLOGY REPORT*  Clinical Data: Fall.  Right hip pain.  RIGHT HIP - COMPLETE 2+ VIEW  Comparison: CT abdomen and pelvis 03/02/2007.  Findings: Left total hip replacement is noted. Note is made that there is thinning of the medial wall of the left acetabulum.  One of the screws in the acetabular component projects into the soft tissues of the pelvis.  The right hip is located.  There is no fracture.  IMPRESSION:  1.  Negative for fracture. 2.  Left hip replacement is unchanged in appearance.  Original Report Authenticated By: Bernadene Bell. D'ALESSIO, M.D.   Dg Ankle Complete Right  05/24/2011  *RADIOLOGY  REPORT*  Clinical Data: Fall.  Lateral ankle pain.  RIGHT ANKLE - COMPLETE 3+ VIEW  Comparison: None.  Findings: No acute bony or joint abnormality is identified.  Pins in the distal first metatarsal consistent with hallux valgus repair noted.  IMPRESSION: No acute finding.  Original Report Authenticated By: Bernadene Bell. Maricela Curet, M.D.     No diagnosis found.    MDM  1- Seizures- Patient on polytherapy for seizure control and continues to have mutliple break through seizures.  Patient with two seizures today, above baseline  2- nausea and  vomiting- Patient without tenderness on abdominal exam.  LFT significantly elevated with unclear etiology. Will hold tonight's dilantin.  Discussed with Dr.Doutova and aware of seizure med held due to elevated lft.   3- hip and ankle pain after seizure today- x-Laiba Fuerte without fx.   Patient accepted to team 9 to tele bed.   I personally performed the services described in this documentation, which was scribed in my presence. The recorded information has been reviewed and considered.         Hilario Quarry, MD 05/24/11 2256

## 2011-05-24 NOTE — ED Notes (Signed)
MD at bedside. Dr. Rosalia Hammers in to talk with pt and family.

## 2011-05-25 ENCOUNTER — Encounter (HOSPITAL_COMMUNITY): Payer: Self-pay | Admitting: Internal Medicine

## 2011-05-25 ENCOUNTER — Inpatient Hospital Stay (HOSPITAL_COMMUNITY): Payer: Medicare Other

## 2011-05-25 DIAGNOSIS — R519 Headache, unspecified: Secondary | ICD-10-CM | POA: Diagnosis present

## 2011-05-25 DIAGNOSIS — R740 Nonspecific elevation of levels of transaminase and lactic acid dehydrogenase [LDH]: Secondary | ICD-10-CM

## 2011-05-25 DIAGNOSIS — G40802 Other epilepsy, not intractable, without status epilepticus: Secondary | ICD-10-CM

## 2011-05-25 DIAGNOSIS — R945 Abnormal results of liver function studies: Secondary | ICD-10-CM

## 2011-05-25 DIAGNOSIS — K759 Inflammatory liver disease, unspecified: Secondary | ICD-10-CM

## 2011-05-25 DIAGNOSIS — R7402 Elevation of levels of lactic acid dehydrogenase (LDH): Secondary | ICD-10-CM

## 2011-05-25 DIAGNOSIS — R7401 Elevation of levels of liver transaminase levels: Secondary | ICD-10-CM

## 2011-05-25 DIAGNOSIS — D72829 Elevated white blood cell count, unspecified: Secondary | ICD-10-CM | POA: Diagnosis present

## 2011-05-25 DIAGNOSIS — G40309 Generalized idiopathic epilepsy and epileptic syndromes, not intractable, without status epilepticus: Secondary | ICD-10-CM

## 2011-05-25 DIAGNOSIS — R51 Headache: Secondary | ICD-10-CM | POA: Diagnosis present

## 2011-05-25 DIAGNOSIS — R569 Unspecified convulsions: Secondary | ICD-10-CM

## 2011-05-25 LAB — BASIC METABOLIC PANEL
BUN: 7 mg/dL (ref 6–23)
CO2: 22 mEq/L (ref 19–32)
Calcium: 9.2 mg/dL (ref 8.4–10.5)
Chloride: 103 mEq/L (ref 96–112)
Creatinine, Ser: 0.53 mg/dL (ref 0.50–1.10)
GFR calc Af Amer: >90 mL/min (ref >90)
GFR calc non Af Amer: >90 mL/min (ref >90)
Glucose, Bld: 100 mg/dL — ABNORMAL HIGH (ref 70–99)
Potassium: 3.1 mEq/L — ABNORMAL LOW (ref 3.5–5.1)
Sodium: 140 mEq/L (ref 135–145)

## 2011-05-25 LAB — CBC
HCT: 34.3 % — ABNORMAL LOW (ref 36.0–46.0)
Hemoglobin: 11.8 g/dL — ABNORMAL LOW (ref 12.0–15.0)
MCH: 29 pg (ref 26.0–34.0)
MCHC: 34.4 g/dL (ref 30.0–36.0)
MCV: 84.3 fL (ref 78.0–100.0)
Platelets: 238 10*3/uL (ref 150–400)
RBC: 4.07 MIL/uL (ref 3.87–5.11)
RDW: 13.7 % (ref 11.5–15.5)
WBC: 7.7 10*3/uL (ref 4.0–10.5)

## 2011-05-25 LAB — HEPATIC FUNCTION PANEL
ALT: 1424 U/L — ABNORMAL HIGH (ref 0–35)
AST: 2069 U/L — ABNORMAL HIGH (ref 0–37)
Albumin: 4 g/dL (ref 3.5–5.2)
Alkaline Phosphatase: 101 U/L (ref 39–117)
Bilirubin, Direct: <0.1 mg/dL (ref 0.0–0.3)
Total Bilirubin: 0.4 mg/dL (ref 0.3–1.2)
Total Protein: 6.4 g/dL (ref 6.0–8.3)

## 2011-05-25 LAB — MAGNESIUM: Magnesium: 1.9 mg/dL (ref 1.5–2.5)

## 2011-05-25 LAB — CK: Total CK: 75 U/L (ref 7–177)

## 2011-05-25 LAB — ACETAMINOPHEN LEVEL: Acetaminophen (Tylenol), Serum: <15 ug/mL (ref 10–30)

## 2011-05-25 LAB — SALICYLATE LEVEL: Salicylate Lvl: <2 mg/dL — ABNORMAL LOW (ref 2.8–20.0)

## 2011-05-25 MED ORDER — ACETAMINOPHEN 325 MG PO TABS: 650.0000 mg | ORAL_TABLET | Freq: Four times a day (QID) | ORAL | Status: DC | PRN

## 2011-05-25 MED ORDER — CLONAZEPAM 0.5 MG PO TABS
0.5000 mg | ORAL_TABLET | Freq: Every day | ORAL | Status: DC
  Administered 2011-05-25 – 2011-05-27 (×3): 0.5 mg via ORAL
  Filled 2011-05-25 (×4): qty 1

## 2011-05-25 MED ORDER — ALUM & MAG HYDROXIDE-SIMETH 200-200-20 MG/5ML PO SUSP: 30.0000 mL | Freq: Four times a day (QID) | ORAL | Status: DC | PRN

## 2011-05-25 MED ORDER — SODIUM CHLORIDE 0.9 % IJ SOLN
3.0000 mL | Freq: Two times a day (BID) | INTRAMUSCULAR | Status: DC
  Administered 2011-05-25 – 2011-05-28 (×6): 3 mL via INTRAVENOUS

## 2011-05-25 MED ORDER — OXYCODONE HCL 5 MG PO TABS
5.0000 mg | ORAL_TABLET | ORAL | Status: DC | PRN
  Administered 2011-05-25 – 2011-05-28 (×7): 5 mg via ORAL
  Filled 2011-05-25 (×7): qty 1

## 2011-05-25 MED ORDER — ENOXAPARIN SODIUM 40 MG/0.4ML ~~LOC~~ SOLN
40.0000 mg | SUBCUTANEOUS | Status: DC
  Administered 2011-05-25 – 2011-05-27 (×3): 40 mg via SUBCUTANEOUS
  Filled 2011-05-25 (×4): qty 0.4

## 2011-05-25 MED ORDER — BACLOFEN 5 MG HALF TABLET
5.0000 mg | ORAL_TABLET | Freq: Three times a day (TID) | ORAL | Status: DC
  Administered 2011-05-25 – 2011-05-28 (×9): 5 mg via ORAL
  Filled 2011-05-25 (×13): qty 1

## 2011-05-25 MED ORDER — ZOLPIDEM TARTRATE 5 MG PO TABS: 5.0000 mg | ORAL_TABLET | Freq: Every evening | ORAL | Status: DC | PRN

## 2011-05-25 MED ORDER — ONDANSETRON HCL 4 MG/2ML IJ SOLN
4.0000 mg | Freq: Four times a day (QID) | INTRAMUSCULAR | Status: DC | PRN
  Administered 2011-05-25 – 2011-05-26 (×2): 4 mg via INTRAVENOUS
  Filled 2011-05-25 (×2): qty 2

## 2011-05-25 MED ORDER — SODIUM CHLORIDE 0.9 % IV SOLN
750.0000 mg | Freq: Two times a day (BID) | INTRAVENOUS | Status: AC
  Administered 2011-05-25 – 2011-05-26 (×3): 750 mg via INTRAVENOUS
  Filled 2011-05-25 (×3): qty 7.5

## 2011-05-25 MED ORDER — HYDROMORPHONE HCL PF 1 MG/ML IJ SOLN
0.5000 mg | INTRAMUSCULAR | Status: DC | PRN
  Administered 2011-05-25: 1 mg via INTRAVENOUS
  Administered 2011-05-25: 0.5 mg via INTRAVENOUS
  Administered 2011-05-26 – 2011-05-27 (×4): 1 mg via INTRAVENOUS
  Filled 2011-05-25 (×6): qty 1

## 2011-05-25 MED ORDER — LACOSAMIDE 50 MG PO TABS
150.0000 mg | ORAL_TABLET | Freq: Two times a day (BID) | ORAL | Status: DC
  Administered 2011-05-25: 150 mg via ORAL
  Filled 2011-05-25: qty 1
  Filled 2011-05-25: qty 2

## 2011-05-25 MED ORDER — SODIUM CHLORIDE 0.9 % IV SOLN
INTRAVENOUS | Status: DC
  Administered 2011-05-25 (×2): via INTRAVENOUS
  Administered 2011-05-26: 20 mL/h via INTRAVENOUS
  Administered 2011-05-26: 01:00:00 via INTRAVENOUS

## 2011-05-25 MED ORDER — ACETAMINOPHEN 650 MG RE SUPP: 650.0000 mg | Freq: Four times a day (QID) | RECTAL | Status: DC | PRN

## 2011-05-25 MED ORDER — POTASSIUM CHLORIDE CRYS ER 20 MEQ PO TBCR
40.0000 meq | EXTENDED_RELEASE_TABLET | Freq: Once | ORAL | Status: AC
  Administered 2011-05-25: 40 meq via ORAL
  Filled 2011-05-25: qty 2

## 2011-05-25 MED ORDER — ONDANSETRON HCL 4 MG PO TABS: 4.0000 mg | ORAL_TABLET | Freq: Four times a day (QID) | ORAL | Status: DC | PRN

## 2011-05-25 MED ORDER — LAMOTRIGINE 200 MG PO TABS
200.0000 mg | ORAL_TABLET | Freq: Two times a day (BID) | ORAL | Status: DC
  Administered 2011-05-25 – 2011-05-28 (×7): 200 mg via ORAL
  Filled 2011-05-25 (×10): qty 1

## 2011-05-25 MED ORDER — PHENYTOIN SODIUM 50 MG/ML IJ SOLN
500.0000 mg | Freq: Once | INTRAMUSCULAR | Status: DC
  Filled 2011-05-25: qty 10

## 2011-05-25 MED ORDER — PAROXETINE HCL 20 MG PO TABS
20.0000 mg | ORAL_TABLET | Freq: Every day | ORAL | Status: DC
  Administered 2011-05-25 – 2011-05-27 (×3): 20 mg via ORAL
  Filled 2011-05-25 (×5): qty 1

## 2011-05-25 MED ORDER — PHENYTOIN SODIUM EXTENDED 100 MG PO CAPS
100.0000 mg | ORAL_CAPSULE | Freq: Three times a day (TID) | ORAL | Status: DC
  Filled 2011-05-25 (×3): qty 1

## 2011-05-25 NOTE — Progress Notes (Signed)
PT had complained of muscle back spasm. Pt is requesting for a muscle relaxant per [place] recommendation. Ancil Linsey RN

## 2011-05-25 NOTE — Consult Note (Signed)
Reason for Consult: seizures, increased LFT secondary ro medication Referring Physician: Dr Gonzella Lex  CC:  Seizures, increased LFT due to AEDs  HPI: [name] is an 46 y.o. female followed by Dr Sandria Manly, neurologist.  She has electrical and psuedo seizures.  She was on phenytoin 100 mg p otid, vimpat 100 mg po bid and lamictal 200 mg po bid,  She developed abdominal pain and present to the ED where her LFT were elevated and the pt was admitted.  Dr Gonzella Lex discussed the pt personally with me and ask I see her in neurological consult.  He had stopped the phenytoin and vimpat and I suggested he begin IV keppra 750 mg bid. In talking to the pt she said she did very well on the Keppra in the past at 1000 mg po bid but couldn't afford it financially.  I believe it is now generic.  Past Medical History  Diagnosis Date  . Epilepsy   . Seizures   . Vertigo   . Constipation     Past Surgical History  Procedure Date  . Total hip arthroplasty     Left Hip THR X 3  . Abdominal hysterectomy   . Colonoscopy     Family History  Problem Relation Age of Onset  . Cancer Mother   . Cancer Brother   . Diabetes Brother   . Cancer Maternal Aunt   . Diabetes Maternal Aunt     Social History:  reports that she has never smoked. She does not have any smokeless tobacco history on file. She reports that she does not drink alcohol or use illicit drugs.  Allergies  Allergen Reactions  . Benadryl (Diphenhydramine Hcl)   . Codeine     rash  . Melatonin   . Ultram (Tramadol Hcl) Other (See Comments)    Reaction:Seizures  . Vicodin (Hydrocodone-Acetaminophen) Itching  . Latex Rash  . Penicillins Itching and Rash    Medications:  Prior to Admission:  Prescriptions prior to admission  Medication Sig Dispense Refill  . clonazePAM (KLONOPIN) 0.5 MG tablet Take 0.5 mg by mouth at bedtime. For sleep      . Lacosamide 100 MG TABS Take 150 mg by mouth 2 (two) times daily.      Marland Kitchen lamoTRIgine (LAMICTAL)  200 MG tablet Take 200 mg by mouth 2 (two) times daily.        Marland Kitchen PARoxetine (PAXIL) 20 MG tablet Take 1 tablet (20 mg total) by mouth at bedtime.  30 tablet  0  . phenytoin (DILANTIN) 100 MG ER capsule Take 1 capsule (100 mg total) by mouth 3 (three) times daily.  90 capsule  0  . ibuprofen (ADVIL,MOTRIN) 800 MG tablet Take 800 mg by mouth every 8 (eight) hours as needed. For pain        ROS *History obtained from the patient  General ROS: negative for - chills, fatigue, fever, night sweats, weight gain or weight loss Psychological ROS: negative for - behavioral disorder, hallucinations, memory difficulties, mood swings or suicidal ideation Ophthalmic ROS: negative for - blurry vision, double vision, eye pain or loss of vision ENT ROS: negative for - epistaxis, nasal discharge, oral lesions, sore throat, tinnitus or vertigo Allergy and Immunology ROS: negative for - hives or itchy/watery eyes Hematological and Lymphatic ROS: negative for - bleeding problems, bruising or swollen lymph nodes Endocrine ROS: negative for - galactorrhea, hair pattern changes, polydipsia/polyuria or temperature intolerance Respiratory ROS: negative for - cough, hemoptysis, shortness of breath or  wheezing Cardiovascular ROS: negative for - chest pain, dyspnea on exertion, edema or irregular heartbeat Gastrointestinal ROS: positive for - abdominal pain Genito-Urinary ROS: negative for - dysuria, hematuria, incontinence or urinary frequency/urgency Musculoskeletal ROS: negative for - joint swelling or muscular weakness Neurological ROS: as noted in HPI Dermatological ROS: negative for rash and skin lesion changes  Physical Examination: Blood pressure 103/70, pulse 87, temperature 98.1 F (36.7 C), temperature source Oral, resp. rate 20, height 5' (1.524 m), weight 77.6 kg (171 lb 1.2 oz), SpO2 96.00%.  Neurologic Examination Mental Status: Alert, oriented, thought content appropriate.  Speech fluent without  evidence of aphasia.  Able to follow 3 step commands without difficulty. Cranial Nerves: II: visual fields grossly normal, pupils equal, round, reactive to light. III,IV, VI: ptosis not present, extra-ocular motions intact bilaterally without nystagmus V,VII: smile symmetric, facial light touch sensation normal bilaterally VIII: hearing normal bilaterally to speech IX,X: gag reflex mot testedt XI: trapezius strength/neck flexion strength normal bilaterally XII: tongue strength normal  Motor: Right : Upper extremity   5/5    Left:     Upper extremity   5/5  Lower extremity   5/5     Lower extremity   5/5 Tone and bulk:normal tone throughout; no atrophy noted Sensory: Llight touch intact throughout, bilaterally Deep Tendon Reflexes: 1+ and symmetric throughout Plantars: Right: downgoing   Left: downgoing Cerebellar: normal finger-to-noset normal gait and station  Results for orders placed during the hospital encounter of 05/24/11 (from the past 48 hour(s))  CBC     Status: Abnormal   Collection Time   05/24/11  7:25 PM      Component Value Range Comment   WBC 12.5 (*) 4.0 - 10.5 (K/uL)    RBC 4.03  3.87 - 5.11 (MIL/uL)    Hemoglobin 11.8 (*) 12.0 - 15.0 (g/dL)    HCT 16.1 (*) 09.6 - 46.0 (%)    MCV 83.9  78.0 - 100.0 (fL)    MCH 29.3  26.0 - 34.0 (pg)    MCHC 34.9  30.0 - 36.0 (g/dL)    RDW 04.5  40.9 - 81.1 (%)    Platelets 253  150 - 400 (K/uL)   DIFFERENTIAL     Status: Abnormal   Collection Time   05/24/11  7:25 PM      Component Value Range Comment   Neutrophils Relative 83 (*) 43 - 77 (%)    Neutro Abs 10.3 (*) 1.7 - 7.7 (K/uL)    Lymphocytes Relative 12  12 - 46 (%)    Lymphs Abs 1.5  0.7 - 4.0 (K/uL)    Monocytes Relative 5  3 - 12 (%)    Monocytes Absolute 0.6  0.1 - 1.0 (K/uL)    Eosinophils Relative 0  0 - 5 (%)    Eosinophils Absolute 0.0  0.0 - 0.7 (K/uL)    Basophils Relative 0  0 - 1 (%)    Basophils Absolute 0.0  0.0 - 0.1 (K/uL)   COMPREHENSIVE METABOLIC  PANEL     Status: Abnormal   Collection Time   05/24/11  7:25 PM      Component Value Range Comment   Sodium 137  135 - 145 (mEq/L)    Potassium 3.5  3.5 - 5.1 (mEq/L)    Chloride 101  96 - 112 (mEq/L)    CO2 21  19 - 32 (mEq/L)    Glucose, Bld 99  70 - 99 (mg/dL)    BUN 9  6 - 23 (mg/dL)    Creatinine, Ser 9.52  0.50 - 1.10 (mg/dL)    Calcium 9.4  8.4 - 10.5 (mg/dL)    Total Protein 6.6  6.0 - 8.3 (g/dL)    Albumin 4.3  3.5 - 5.2 (g/dL)    AST 841 (*) 0 - 37 (U/L)    ALT 556 (*) 0 - 35 (U/L)    Alkaline Phosphatase 100  39 - 117 (U/L)    Total Bilirubin 0.3  0.3 - 1.2 (mg/dL)    GFR calc non Af Amer >90  >90 (mL/min)    GFR calc Af Amer >90  >90 (mL/min)   PHENYTOIN LEVEL, TOTAL     Status: Abnormal   Collection Time   05/24/11  7:25 PM      Component Value Range Comment   Phenytoin Lvl 7.6 (*) 10.0 - 20.0 (ug/mL)   URINALYSIS, ROUTINE W REFLEX MICROSCOPIC     Status: Abnormal   Collection Time   05/24/11  8:51 PM      Component Value Range Comment   Color, Urine YELLOW  YELLOW     APPearance CLOUDY (*) CLEAR     Specific Gravity, Urine 1.015  1.005 - 1.030     pH 6.0  5.0 - 8.0     Glucose, UA NEGATIVE  NEGATIVE (mg/dL)    Hgb urine dipstick NEGATIVE  NEGATIVE     Bilirubin Urine NEGATIVE  NEGATIVE     Ketones, ur NEGATIVE  NEGATIVE (mg/dL)    Protein, ur NEGATIVE  NEGATIVE (mg/dL)    Urobilinogen, UA 0.2  0.0 - 1.0 (mg/dL)    Nitrite NEGATIVE  NEGATIVE     Leukocytes, UA NEGATIVE  NEGATIVE  MICROSCOPIC NOT DONE ON URINES WITH NEGATIVE PROTEIN, BLOOD, LEUKOCYTES, NITRITE, OR GLUCOSE <1000 mg/dL.  PROTIME-INR     Status: Normal   Collection Time   05/24/11  9:27 PM      Component Value Range Comment   Prothrombin Time 14.5  11.6 - 15.2 (seconds)    INR 1.11  0.00 - 1.49    APTT     Status: Normal   Collection Time   05/24/11  9:27 PM      Component Value Range Comment   aPTT 32  24 - 37 (seconds)   AMMONIA     Status: Normal   Collection Time   05/24/11  9:27 PM       Component Value Range Comment   Ammonia 45  11 - 60 (umol/L)   TROPONIN I     Status: Normal   Collection Time   05/24/11  9:49 PM      Component Value Range Comment   Troponin I <0.30  <0.30 (ng/mL)   CBC     Status: Abnormal   Collection Time   05/25/11  6:30 AM      Component Value Range Comment   WBC 7.7  4.0 - 10.5 (K/uL)    RBC 4.07  3.87 - 5.11 (MIL/uL)    Hemoglobin 11.8 (*) 12.0 - 15.0 (g/dL)    HCT 32.4 (*) 40.1 - 46.0 (%)    MCV 84.3  78.0 - 100.0 (fL)    MCH 29.0  26.0 - 34.0 (pg)    MCHC 34.4  30.0 - 36.0 (g/dL)    RDW 02.7  25.3 - 66.4 (%)    Platelets 238  150 - 400 (K/uL)   BASIC METABOLIC PANEL     Status: Abnormal   Collection  Time   05/25/11  6:30 AM      Component Value Range Comment   Sodium 140  135 - 145 (mEq/L)    Potassium 3.1 (*) 3.5 - 5.1 (mEq/L)    Chloride 103  96 - 112 (mEq/L)    CO2 22  19 - 32 (mEq/L)    Glucose, Bld 100 (*) 70 - 99 (mg/dL)    BUN 7  6 - 23 (mg/dL)    Creatinine, Ser 3.08  0.50 - 1.10 (mg/dL)    Calcium 9.2  8.4 - 10.5 (mg/dL)    GFR calc non Af Amer >90  >90 (mL/min)    GFR calc Af Amer >90  >90 (mL/min)   MAGNESIUM     Status: Normal   Collection Time   05/25/11  7:08 AM      Component Value Range Comment   Magnesium 1.9  1.5 - 2.5 (mg/dL)   HEPATIC FUNCTION PANEL     Status: Abnormal   Collection Time   05/25/11  7:08 AM      Component Value Range Comment   Total Protein 6.4  6.0 - 8.3 (g/dL)    Albumin 4.0  3.5 - 5.2 (g/dL)    AST 6578 (*) 0 - 37 (U/L)    ALT 1424 (*) 0 - 35 (U/L)    Alkaline Phosphatase 101  39 - 117 (U/L)    Total Bilirubin 0.4  0.3 - 1.2 (mg/dL)    Bilirubin, Direct <4.6  0.0 - 0.3 (mg/dL)    Indirect Bilirubin NOT CALCULATED  0.3 - 0.9 (mg/dL)   ACETAMINOPHEN LEVEL     Status: Normal   Collection Time   05/25/11 12:05 PM      Component Value Range Comment   Acetaminophen (Tylenol), Serum <15.0  10 - 30 (ug/mL)   SALICYLATE LEVEL     Status: Abnormal   Collection Time   05/25/11 12:05 PM       Component Value Range Comment   Salicylate Lvl <2.0 (*) 2.8 - 20.0 (mg/dL)   CK     Status: Normal   Collection Time   05/25/11 12:05 PM      Component Value Range Comment   Total CK 75  7 - 177 (U/L)     No results found for this or any previous visit (from the past 240 hour(s)).  Dg Hip Complete Right  05/24/2011  *RADIOLOGY REPORT*  Clinical Data: Fall.  Right hip pain.  RIGHT HIP - COMPLETE 2+ VIEW  Comparison: CT abdomen and pelvis 03/02/2007.  Findings: Left total hip replacement is noted. Note is made that there is thinning of the medial wall of the left acetabulum.  One of the screws in the acetabular component projects into the soft tissues of the pelvis.  The right hip is located.  There is no fracture.  IMPRESSION:  1.  Negative for fracture. 2.  Left hip replacement is unchanged in appearance.  Original Report Authenticated By: Bernadene Bell. D'ALESSIO, M.D.   Dg Ankle Complete Right  05/24/2011  *RADIOLOGY REPORT*  Clinical Data: Fall.  Lateral ankle pain.  RIGHT ANKLE - COMPLETE 3+ VIEW  Comparison: None.  Findings: No acute bony or joint abnormality is identified.  Pins in the distal first metatarsal consistent with hallux valgus repair noted.  IMPRESSION: No acute finding.  Original Report Authenticated By: Bernadene Bell. Maricela Curet, M.D.     Assessment/Plan:  Seizures, electrical and psuedo Increasd LFT   Plan: I concur with Dr Valora Piccolo  plan to stop phenytoin and vimpat and to continue the lamictal. I would begin keppra 750mg  IV bid for 24 hours and then increase to 1000 mg IV for 24 hours then transition to keppra 1000 mg po bid with the lamictal.  Elana Alm. Elita Boone, MD   05/25/2011, 6:09 PM

## 2011-05-25 NOTE — Consult Note (Signed)
Referring Provider: [facility] Primary Care Physician:  Sabino Dick, OTR Primary Gastroenterologist:  Dr Marina Goodell.  Reason for Consultation:  Transaminitis  HPI: [name] is a 46 y.o. female admitted last evening after [facility] with complaints of headache and and 2 seizures. Patient states that she does have history of epilepsy and is followed by [name]. She has had generalized seizures as well as ab scence seizures . Yesterday morning she says she was out walking despite having a headache and says that she had a seizure must have fallen on the road and was found by a stranger. She's not sure how long she was laying on the road, she says she has felt sore all over since that time and apparently fell forward. She does not have any obvious bruising but has a sore hip on the right. Her husband was called she went home and apparently had another seizure continued to complain of a headache and also says she felt nauseated all day yesterday and therefore came on to the emergency room. Prior to yesterday she says she had been  feeling fine, although she's been somewhat fatigued.  She has no prior history of GI problems. She did have a colonoscopy in 2009 with Dr. Yancey Flemings. Had 3 polyps removed at that time. She has no prior history of hepatitis and is not aware of ever being told that she had elevated liver function studies. She does not use alcohol on regular basis. She's not using any other over-the-counter supplements diet pills etc. other than her prescription medication . Patient states she's she's been on Dilantin for several years but recently had an increase in her dose from 100 mg twice daily 200 mg 3 times daily about one month ago. She has been on Lamictal for several years at the same dosage and started glucose at about 6 months ago. She also takes Paxil and Klonopin and has not had any dosage changes.  On admission her AST was  987 ALT of 556 bilirubin is normal as is alkaline phosphatase. Today AST is 2069 and ALT is 1429 area labs are otherwise unremarkable with the exception of a potassium of 3.1 argon Dilantin level was low on admission.   Past Medical History  Diagnosis Date  . Epilepsy   . Seizures   . Vertigo   . Constipation     Past Surgical History  Procedure Date  . Total hip arthroplasty     Left Hip THR X 3  . Abdominal hysterectomy   . Colonoscopy     Prior to Admission medications   Medication Sig Start Date End Date Taking? Authorizing Provider  clonazePAM (KLONOPIN) 0.5 MG tablet Take 0.5 mg by mouth at bedtime. For sleep   Yes Historical Provider, MD  Lacosamide 100 MG TABS Take 150 mg by mouth 2 (two) times daily.   Yes Historical Provider, MD  lamoTRIgine (LAMICTAL) 200 MG tablet Take 200 mg by mouth 2 (two) times daily.     Yes Historical Provider, MD  PARoxetine (PAXIL) 20 MG tablet Take 1 tablet (20 mg total) by mouth at bedtime. 03/30/11 03/29/12 Yes Vassie Loll, MD  phenytoin (DILANTIN) 100 MG ER capsule Take 1 capsule (100 mg total) by mouth 3 (three) times daily. 03/30/11 03/29/12 Yes Vassie Loll, MD  ibuprofen (ADVIL,MOTRIN) 800 MG tablet Take 800 mg by mouth every 8 (eight) hours as needed. For pain    Historical Provider, MD  Current Facility-Administered Medications  Medication Dose Route Frequency Provider Last Rate Last Dose  . 0.9 %  sodium chloride infusion   Intravenous Once Hilario Quarry, MD      . 0.9 %  sodium chloride infusion   Intravenous Continuous Ron Parker, MD 100 mL/hr at 05/25/11 0700    . alum & mag hydroxide-simeth (MAALOX/MYLANTA) 200-200-20 MG/5ML suspension 30 mL  30 mL Oral Q6H PRN Ron Parker, MD      . clonazePAM (KLONOPIN) tablet 0.5 mg  0.5 mg Oral QHS Harvette Velora Heckler, MD      . enoxaparin (LOVENOX) injection 40 mg  40 mg Subcutaneous Q24H Ron Parker, MD   40 mg at 05/25/11 1000  . HYDROmorphone (DILAUDID) injection  0.5-1 mg  0.5-1 mg Intravenous Q3H PRN Ron Parker, MD      . lamoTRIgine (LAMICTAL) tablet 200 mg  200 mg Oral BID Ron Parker, MD   200 mg at 05/25/11 1001  . ondansetron (ZOFRAN) injection 4 mg  4 mg Intravenous Once Hilario Quarry, MD   4 mg at 05/24/11 1951  . ondansetron (ZOFRAN) tablet 4 mg  4 mg Oral Q6H PRN Ron Parker, MD       Or  . ondansetron (ZOFRAN) injection 4 mg  4 mg Intravenous Q6H PRN Ron Parker, MD      . oxyCODONE (Oxy IR/ROXICODONE) immediate release tablet 5 mg  5 mg Oral Q4H PRN Ron Parker, MD   5 mg at 05/25/11 0846  . PARoxetine (PAXIL) tablet 20 mg  20 mg Oral QHS Harvette C Jenkins, MD      . potassium chloride SA (K-DUR,KLOR-CON) CR tablet 40 mEq  40 mEq Oral Once Nishant Dhungel, MD   40 mEq at 05/25/11 1001  . sodium chloride 0.9 % injection 3 mL  3 mL Intravenous Q12H Harvette Velora Heckler, MD      . DISCONTD: acetaminophen (TYLENOL) suppository 650 mg  650 mg Rectal Q6H PRN Ron Parker, MD      . DISCONTD: acetaminophen (TYLENOL) tablet 650 mg  650 mg Oral Q6H PRN Ron Parker, MD      . DISCONTD: gi cocktail (Maalox,Lidocaine,Donnatal)  30 mL Oral Once Hilario Quarry, MD      . DISCONTD: lacosamide (VIMPAT) tablet 150 mg  150 mg Oral BID Ron Parker, MD   150 mg at 05/25/11 0900  . DISCONTD: pantoprazole (PROTONIX) injection 40 mg  40 mg Intravenous Once Hilario Quarry, MD      . DISCONTD: phenytoin (DILANTIN) 500 mg in sodium chloride 0.9 % 100 mL IVPB  500 mg Intravenous Once Ron Parker, MD      . DISCONTD: phenytoin (DILANTIN) ER capsule 100 mg  100 mg Oral TID Ron Parker, MD      . DISCONTD: zolpidem (AMBIEN) tablet 5 mg  5 mg Oral QHS PRN Ron Parker, MD        Allergies as of 05/24/2011 - Review Complete 05/24/2011  Allergen Reaction Noted  . Benadryl (diphenhydramine hcl)  04/23/2011  . Codeine    . Melatonin  04/23/2011  . Ultram (tramadol hcl) Other (See Comments)  07/15/2010  . Vicodin (hydrocodone-acetaminophen) Itching 12/26/2010  . Latex Rash 07/15/2010  . Penicillins Itching and Rash     Family History  Problem Relation Age of Onset  . Cancer Mother   . Cancer Brother   . Diabetes Brother   .  Cancer Maternal Aunt   . Diabetes Maternal Aunt     History   Social History  . Marital Status: Married    Spouse Name: N/A    Number of Children: N/A  . Years of Education: N/A   Occupational History  . Not on file.   Social History Main Topics  . Smoking status: Never Smoker   . Smokeless tobacco: Not on file  . Alcohol Use: No  . Drug Use: No  . Sexually Active: Yes    Birth Control/ Protection: Post-menopausal   Other Topics Concern  . Not on file   Social History Narrative  . No narrative on file    Review of Systems: Pertinent positive and negative review of systems were noted in the above HPI section.  All other review of systems was otherwise negative.l Physical Exam: Vital signs in last 24 hours: Temp:  [98.1 F (36.7 C)-99.7 F (37.6 C)] 98.8 F (37.1 C) (05/12 0600) Pulse Rate:  [78-112] 78  (05/12 0600) Resp:  [18-20] 18  (05/12 0600) BP: (112-125)/(63-87) 112/73 mmHg (05/12 0600) SpO2:  [96 %-100 %] 96 % (05/12 0600) Weight:  [160 lb (72.576 kg)-171 lb 1.2 oz (77.6 kg)] 171 lb 1.2 oz (77.6 kg) (05/12 0109) Last BM Date: 05/24/11 General:   Alert,  Well-developed, well-nourished, pleasant and cooperative in NAD Head:  Normocephalic and atraumatic. Eyes:  Sclera clear, no icterus.   Conjunctiva pink. Ears:  Normal auditory acuity. Nose:  No deformity, discharge,  or lesions. Mouth:  No deformity or lesions.   Neck:  Supple; no masses or thyromegaly. Lungs:  Clear throughout to auscultation.   No wheezes, crackles, or rhonchi. Heart:  Regular rate and rhythm; no murmurs, clicks, rubs,  or gallops. Abdomen:  Soft, very mild diffuse tenderness, no guarding, no rebound, no ecchymosis, no mass or hsm, BS+ Msk:   Symmetrical without gross deformities. . Pulses:  Normal pulses noted. Extremities:  Without clubbing or edema. Neurologic:  Alert and  oriented x4;  grossly normal neurologically. Skin:  Intact without significant lesions or rashes.. Psych:  Alert and cooperative. Normal mood and affect.  Intake/Output from previous day: 05/11 0701 - 05/12 0700 In: 300 [P.O.:200; I.V.:100] Out: -  Intake/Output this shift: Total I/O In: 100 [I.V.:100] Out: -   Lab Results:  Basename 05/25/11 0630 05/24/11 1925  WBC 7.7 12.5*  HGB 11.8* 11.8*  HCT 34.3* 33.8*  PLT 238 253   BMET  Basename 05/25/11 0630 05/24/11 1925  NA 140 137  K 3.1* 3.5  CL 103 101  CO2 22 21  GLUCOSE 100* 99  BUN 7 9  CREATININE 0.53 0.50  CALCIUM 9.2 9.4   LFT  Basename 05/25/11 0708  PROT 6.4  ALBUMIN 4.0  AST 2069*  ALT 1424*  ALKPHOS 101  BILITOT 0.4  BILIDIR <0.1  IBILI NOT CALCULATED   PT/INR  Basename 05/24/11 2127  LABPROT 14.5  INR 1.11   Hepatitis Panel No results found for this basename: HEPBSAG,HCVAB,HEPAIGM,HEPBIGM in the last 72 hours    Studies/Results: Dg Hip Complete Right  05/24/2011  *RADIOLOGY REPORT*  Clinical Data: Fall.  Right hip pain.  RIGHT HIP - COMPLETE 2+ VIEW  Comparison: CT abdomen and pelvis 03/02/2007.  Findings: Left total hip replacement is noted. Note is made that there is thinning of the medial wall of the left acetabulum.  One of the screws in the acetabular component projects into the soft tissues of the pelvis.  The right hip is  located.  There is no fracture.  IMPRESSION:  1.  Negative for fracture. 2.  Left hip replacement is unchanged in appearance.  Original Report Authenticated By: Bernadene Bell. D'ALESSIO, M.D.   Dg Ankle Complete Right  05/24/2011  *RADIOLOGY REPORT*  Clinical Data: Fall.  Lateral ankle pain.  RIGHT ANKLE - COMPLETE 3+ VIEW  Comparison: None.  Findings: No acute bony or joint abnormality is identified.  Pins in the distal first metatarsal  consistent with hallux valgus repair noted.  IMPRESSION: No acute finding.  Original Report Authenticated By: Bernadene Bell. Maricela Curet, M.D.    IMPRESSION:  #77 46 year old female with seizure disorder admitted yesterday with headache and 2 seizures- 1 unwitnessed and associated with a fall #2 Marked transaminitis, etiology not clear. Patient had normal liver function studies in September of 2012. Suspect this is medication induced secondary to Dilantin and/ or lamictal.. Would also workup for possibility of mild Rhabdomyolysis- Lamictal does list rhabdomyolysis as a possible adverse rxn . Will check acute hepatitis panel though feel less likely. Patient does not have any evidence of hepatic failure, synthetic function appears intact. PLAN: #1 Abdominal ultrasound has been ordered and will followup #2 check CKs #3 would involve neurology to help with decisions regarding holding seizure meds in setting of active seizure disorder.Mike Gip  05/25/2011, 11:26 AM

## 2011-05-25 NOTE — H&P (Signed)
DATE OF ADMISSION:  05/25/2011  PCP:   Mathis Fare, OTR, OTR   Chief Complaint: Headache , Seizures x 2   HPI: [name] is an 46 y.o. female who presented to the [facility] ED due to severe headaches and 2 episodes of witness seizures earlier in the day.  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  She denies having any fevers or chills, nausea, vomiting or diarrhea.  When she was evaluated in the ED her laboratory studies revealed an elevation in her  Transaminases.  She denies any history of alcohol or drug use, so it was thought that the etiology may be due to her seizure medications or viral in origin. She was transferred for further evaluation.    Past Medical History  Diagnosis Date  . Epilepsy   . Seizures   . Vertigo   . Constipation     Past Surgical History  Procedure Date  . Total hip arthroplasty     Left Hip THR X 3  . Abdominal hysterectomy   . Colonoscopy     Medications:  HOME MEDS: Prior to Admission medications   Medication Sig Start Date End Date Taking? Authorizing Provider  clonazePAM (KLONOPIN) 0.5 MG tablet Take 0.5 mg by mouth at bedtime. For sleep   Yes Historical Provider, MD  Lacosamide 100 MG TABS Take 150 mg by mouth 2 (two) times daily.   Yes Historical Provider, MD  lamoTRIgine (LAMICTAL) 200 MG tablet Take 200 mg by mouth 2 (two) times daily.     Yes Historical Provider, MD  PARoxetine (PAXIL) 20 MG tablet Take 1 tablet (20 mg total) by mouth at bedtime. 03/30/11 03/29/12 Yes Vassie Loll, MD  phenytoin (DILANTIN) 100 MG ER capsule Take 1 capsule (100 mg total) by mouth 3 (three) times daily. 03/30/11 03/29/12 Yes Vassie Loll, MD  ibuprofen (ADVIL,MOTRIN) 800 MG tablet Take 800 mg by mouth every 8 (eight) hours as needed. For pain    Historical Provider, MD    Allergies:  Allergies  Allergen Reactions  . Benadryl (Diphenhydramine Hcl)   . Codeine     rash  .  Melatonin   . Ultram (Tramadol Hcl) Other (See Comments)    Reaction:Seizures  . Vicodin (Hydrocodone-Acetaminophen) Itching  . Latex Rash  . Penicillins Itching and Rash    Social History:   reports that she has never smoked. She does not have any smokeless tobacco history on file. She reports that she does not drink alcohol or use illicit drugs.  Family History: Family History  Problem Relation Age of Onset  . Cancer Mother   . Cancer Brother   . Diabetes Brother   . Cancer Maternal Aunt   . Diabetes Maternal Aunt     Review of Systems:  The patient denies anorexia, fever, weight loss, vision loss, decreased hearing, hoarseness, chest pain, syncope, dyspnea on exertion, peripheral edema, balance deficits, hemoptysis, abdominal pain, melena, hematochezia, severe indigestion/heartburn, hematuria, incontinence, genital sores, muscle weakness, suspicious skin lesions, transient blindness, difficulty walking, depression, unusual weight change, abnormal bleeding, enlarged lymph nodes, angioedema, and breast masses.   Physical Exam:  GEN:  Pleasant 46 year old Obese well developed Caucasian female examined  and in no acute distress; cooperative with exam Filed Vitals:   05/24/11 2102 05/24/11 2152 05/24/11 2300 05/25/11 0109  BP: 118/71 112/77 118/63 116/80  Pulse: 92 88 90 88  Temp: 98.6 F (37 C) 98.9 F (  37.2 C) 98.1 F (36.7 C) 99.7 F (37.6 C)  TempSrc: Oral Oral Oral   Resp: 20 20 20 18   Height:    5' (1.524 m)  Weight:    77.6 kg (171 lb 1.2 oz)  SpO2: 99% 100% 99% 97%   Blood pressure 116/80, pulse 88, temperature 99.7 F (37.6 C), temperature source Oral, resp. rate 18, height 5' (1.524 m), weight 77.6 kg (171 lb 1.2 oz), SpO2 97.00%. PSYCH: She is alert and oriented x4; does not appear anxious does not appear depressed; affect is normal HEENT: Normocephalic and Atraumatic, Mucous membranes pink; PERRLA; EOM intact; Fundi:  Benign;  No scleral icterus, Nares: Patent,  Oropharynx: Clear, Fair Dentition, Neck:  FROM, no cervical lymphadenopathy nor thyromegaly or carotid bruit; no JVD; Breasts:: Not examined CHEST WALL: No tenderness CHEST: Normal respiration, clear to auscultation bilaterally HEART: Regular rate and rhythm; no murmurs rubs or gallops BACK: No kyphosis or scoliosis; no CVA tenderness ABDOMEN: Positive Bowel Sounds, Obese, soft non-tender; no masses, no organomegaly, no pannus; no intertriginous candida. Rectal Exam: Not done EXTREMITIES: No bone or joint deformity; age-appropriate arthropathy of the hands and knees; no cyanosis, clubbing or edema; no ulcerations. Genitalia: not examined PULSES: 2+ and symmetric SKIN: Normal hydration no rash or ulceration CNS: Cranial nerves 2-12 grossly intact no focal neurologic deficit   Labs & Imaging Results for orders placed during the hospital encounter of 05/24/11 (from the past 48 hour(s))  CBC     Status: Abnormal   Collection Time   05/24/11  7:25 PM      Component Value Range Comment   WBC 12.5 (*) 4.0 - 10.5 (K/uL)    RBC 4.03  3.87 - 5.11 (MIL/uL)    Hemoglobin 11.8 (*) 12.0 - 15.0 (g/dL)    HCT 30.8 (*) 65.7 - 46.0 (%)    MCV 83.9  78.0 - 100.0 (fL)    MCH 29.3  26.0 - 34.0 (pg)    MCHC 34.9  30.0 - 36.0 (g/dL)    RDW 84.6  96.2 - 95.2 (%)    Platelets 253  150 - 400 (K/uL)   DIFFERENTIAL     Status: Abnormal   Collection Time   05/24/11  7:25 PM      Component Value Range Comment   Neutrophils Relative 83 (*) 43 - 77 (%)    Neutro Abs 10.3 (*) 1.7 - 7.7 (K/uL)    Lymphocytes Relative 12  12 - 46 (%)    Lymphs Abs 1.5  0.7 - 4.0 (K/uL)    Monocytes Relative 5  3 - 12 (%)    Monocytes Absolute 0.6  0.1 - 1.0 (K/uL)    Eosinophils Relative 0  0 - 5 (%)    Eosinophils Absolute 0.0  0.0 - 0.7 (K/uL)    Basophils Relative 0  0 - 1 (%)    Basophils Absolute 0.0  0.0 - 0.1 (K/uL)   COMPREHENSIVE METABOLIC PANEL     Status: Abnormal   Collection Time   05/24/11  7:25 PM       Component Value Range Comment   Sodium 137  135 - 145 (mEq/L)    Potassium 3.5  3.5 - 5.1 (mEq/L)    Chloride 101  96 - 112 (mEq/L)    CO2 21  19 - 32 (mEq/L)    Glucose, Bld 99  70 - 99 (mg/dL)    BUN 9  6 - 23 (mg/dL)    Creatinine, Ser 8.41  0.50 - 1.10 (mg/dL)    Calcium 9.4  8.4 - 10.5 (mg/dL)    Total Protein 6.6  6.0 - 8.3 (g/dL)    Albumin 4.3  3.5 - 5.2 (g/dL)    AST 161 (*) 0 - 37 (U/L)    ALT 556 (*) 0 - 35 (U/L)    Alkaline Phosphatase 100  39 - 117 (U/L)    Total Bilirubin 0.3  0.3 - 1.2 (mg/dL)    GFR calc non Af Amer >90  >90 (mL/min)    GFR calc Af Amer >90  >90 (mL/min)   PHENYTOIN LEVEL, TOTAL     Status: Abnormal   Collection Time   05/24/11  7:25 PM      Component Value Range Comment   Phenytoin Lvl 7.6 (*) 10.0 - 20.0 (ug/mL)   URINALYSIS, ROUTINE W REFLEX MICROSCOPIC     Status: Abnormal   Collection Time   05/24/11  8:51 PM      Component Value Range Comment   Color, Urine YELLOW  YELLOW     APPearance CLOUDY (*) CLEAR     Specific Gravity, Urine 1.015  1.005 - 1.030     pH 6.0  5.0 - 8.0     Glucose, UA NEGATIVE  NEGATIVE (mg/dL)    Hgb urine dipstick NEGATIVE  NEGATIVE     Bilirubin Urine NEGATIVE  NEGATIVE     Ketones, ur NEGATIVE  NEGATIVE (mg/dL)    Protein, ur NEGATIVE  NEGATIVE (mg/dL)    Urobilinogen, UA 0.2  0.0 - 1.0 (mg/dL)    Nitrite NEGATIVE  NEGATIVE     Leukocytes, UA NEGATIVE  NEGATIVE  MICROSCOPIC NOT DONE ON URINES WITH NEGATIVE PROTEIN, BLOOD, LEUKOCYTES, NITRITE, OR GLUCOSE <1000 mg/dL.  PROTIME-INR     Status: Normal   Collection Time   05/24/11  9:27 PM      Component Value Range Comment   Prothrombin Time 14.5  11.6 - 15.2 (seconds)    INR 1.11  0.00 - 1.49    APTT     Status: Normal   Collection Time   05/24/11  9:27 PM      Component Value Range Comment   aPTT 32  24 - 37 (seconds)   AMMONIA     Status: Normal   Collection Time   05/24/11  9:27 PM      Component Value Range Comment   Ammonia 45  11 - 60 (umol/L)     TROPONIN I     Status: Normal   Collection Time   05/24/11  9:49 PM      Component Value Range Comment   Troponin I <0.30  <0.30 (ng/mL)    Dg Hip Complete Right  05/24/2011  *RADIOLOGY REPORT*  Clinical Data: Fall.  Right hip pain.  RIGHT HIP - COMPLETE 2+ VIEW  Comparison: CT abdomen and pelvis 03/02/2007.  Findings: Left total hip replacement is noted. Note is made that there is thinning of the medial wall of the left acetabulum.  One of the screws in the acetabular component projects into the soft tissues of the pelvis.  The right hip is located.  There is no fracture.  IMPRESSION:  1.  Negative for fracture. 2.  Left hip replacement is unchanged in appearance.  Original Report Authenticated By: Bernadene Bell. D'ALESSIO, M.D.   Dg Ankle Complete Right  05/24/2011  *RADIOLOGY REPORT*  Clinical Data: Fall.  Lateral ankle pain.  RIGHT ANKLE - COMPLETE 3+ VIEW  Comparison: None.  Findings: No acute bony or joint abnormality is identified.  Pins in the distal first metatarsal consistent with hallux valgus repair noted.  IMPRESSION: No acute finding.  Original Report Authenticated By: Bernadene Bell. Maricela Curet, M.D.      Assessment: Present on Admission:  .Abnormal LFTs .Drug-induced hepatic toxicity .Seizures .Headache .Leukocytosis   Plan:    Admit to Telemetry Bed, Seizure precautions, Neuro checks Hepatitis Panel Monitor LFTs Hydrate Reconcile Home Meds.   DVT prophylaxis Other plans as per orders.    CODE STATUS:      FULL CODE           Venera Privott C 05/25/2011, 4:13 AM

## 2011-05-25 NOTE — Progress Notes (Signed)
Dr Justin Mend with pt.

## 2011-05-25 NOTE — Progress Notes (Addendum)
Subjective: Patient seen and examined this morning. informs having epigastric soreness. No nausea at this time. She follows with Dr Sandria Manly at [facility] neurology and has been  dilantin for only 3 months and dose was increased as her level was sub therapeutic 1 month back.   Objective:  Vital signs in last 24 hours:  Filed Vitals:   05/24/11 2152 05/24/11 2300 05/25/11 0109 05/25/11 0600  BP: 112/77 118/63 116/80 112/73  Pulse: 88 90 88 78  Temp: 98.9 F (37.2 C) 98.1 F (36.7 C) 99.7 F (37.6 C) 98.8 F (37.1 C)  TempSrc: Oral Oral Oral Oral  Resp: 20 20 18 18   Height:   5' (1.524 m)   Weight:   77.6 kg (171 lb 1.2 oz)   SpO2: 100% 99% 97% 96%    Intake/Output from previous day:   Intake/Output Summary (Last 24 hours) at 05/25/11 0913 Last data filed at 05/25/11 0800  Gross per 24 hour  Intake    400 ml  Output      0 ml  Net    400 ml    Physical Exam:  General: middle aged female in no acute distress. HEENT: no pallor, no icterus, moist oral mucosa, no JVD, no lymphadenopathy Heart: Normal  s1 &s2  Regular rate and rhythm, without murmurs, rubs, gallops. Lungs: Clear to auscultation bilaterally. Abdomen: Soft, mild tenderness over epigastric and RUQ  On deep palpation, nondistended, positive bowel sounds. Extremities: No clubbing cyanosis or edema with positive pedal pulses. Neuro: Alert, awake, oriented x3, nonfocal.   Lab Results:  Basic Metabolic Panel:    Component Value Date/Time   NA 140 05/25/2011 0630   K 3.1* 05/25/2011 0630   CL 103 05/25/2011 0630   CO2 22 05/25/2011 0630   BUN 7 05/25/2011 0630   CREATININE 0.53 05/25/2011 0630   GLUCOSE 100* 05/25/2011 0630   CALCIUM 9.2 05/25/2011 0630   CBC:    Component Value Date/Time   WBC 7.7 05/25/2011 0630   HGB 11.8* 05/25/2011 0630   HCT 34.3* 05/25/2011 0630   PLT 238 05/25/2011 0630   MCV 84.3 05/25/2011 0630   NEUTROABS 10.3* 05/24/2011 1925   LYMPHSABS 1.5 05/24/2011 1925   MONOABS 0.6 05/24/2011 1925   EOSABS 0.0 05/24/2011 1925   BASOSABS 0.0 05/24/2011 1925    No results found for this or any previous visit (from the past 240 hour(s)).  Studies/Results: Dg Hip Complete Right  05/24/2011  *RADIOLOGY REPORT*  Clinical Data: Fall.  Right hip pain.  RIGHT HIP - COMPLETE 2+ VIEW  Comparison: CT abdomen and pelvis 03/02/2007.  Findings: Left total hip replacement is noted. Note is made that there is thinning of the medial wall of the left acetabulum.  One of the screws in the acetabular component projects into the soft tissues of the pelvis.  The right hip is located.  There is no fracture.  IMPRESSION:  1.  Negative for fracture. 2.  Left hip replacement is unchanged in appearance.  Original Report Authenticated By: Bernadene Bell. D'ALESSIO, M.D.   Dg Ankle Complete Right  05/24/2011  *RADIOLOGY REPORT*  Clinical Data: Fall.  Lateral ankle pain.  RIGHT ANKLE - COMPLETE 3+ VIEW  Comparison: None.  Findings: No acute bony or joint abnormality is identified.  Pins in the distal first metatarsal consistent with hallux valgus repair noted.  IMPRESSION: No acute finding.  Original Report Authenticated By: Bernadene Bell. Maricela Curet, M.D.    Medications: Scheduled Meds:   . sodium chloride  Intravenous Once  . clonazePAM  0.5 mg Oral QHS  . enoxaparin  40 mg Subcutaneous Q24H  . lacosamide  150 mg Oral BID  . lamoTRIgine  200 mg Oral BID  . ondansetron  4 mg Intravenous Once  . PARoxetine  20 mg Oral QHS  . potassium chloride  40 mEq Oral Once  . sodium chloride  3 mL Intravenous Q12H  . DISCONTD: gi cocktail  30 mL Oral Once  . DISCONTD: pantoprazole (PROTONIX) IV  40 mg Intravenous Once  . DISCONTD: phenytoin (DILANTIN) IV  500 mg Intravenous Once  . DISCONTD: phenytoin  100 mg Oral TID   Continuous Infusions:   . sodium chloride 100 mL/hr at 05/25/11 0700   PRN Meds:.acetaminophen, acetaminophen, alum & mag hydroxide-simeth, HYDROmorphone (DILAUDID) injection, ondansetron (ZOFRAN) IV, ondansetron,  oxyCODONE, zolpidem  Assessment/Plan: 46 y/o female with hx of seizure disorder admitted with episodes of nausea and vomiting admitted with 2 episodes of seizure and transaminitis.      *Seizures Noted for sub therapeutic dilantin level. She has low levels 1 mth back and dose increased. Denies missing any dosage. Given transaminitis i will hold dilantin. monitor LFTs and obtain RUQ sonogram. patient also on vimpat ( lacosamide ) for seizures which is contraindicated for use in severe transaminitis which will hold as well continue lamictal.  patient follows with Dr love as outpatient for her seizures Will discuss with neurology if keppra would be a better alterative due to its lack of hepatotoxicity   Transaminitis possibly related to dilantin toxicity . Hold vimpat as well.  Will check RUQ USG given her pain over the area  abdominal pain with nausea and vomiting Cont IV fluids and prn zofran. Pain control. Cont maalox prn  symptoms better now.  Replenish k, check mg  DVT prophylaxis   Diet regular  Full code      LOS: 1 day   Ohn Bostic 05/25/2011, 9:13 AM   Further deranged LFTs noted with AST >2000 and ALT OF 1400. Total bili and Alk P wnl. Was resumed on dilantin on admission and also on prn tylenol for pain/ fever ordered on admission which i have dced. Will check tylenol level, salicylate level and RUQ USG including venous RUQ doppler to evaluate hepatin vein

## 2011-05-26 ENCOUNTER — Inpatient Hospital Stay (HOSPITAL_COMMUNITY): Payer: Medicare Other

## 2011-05-26 ENCOUNTER — Encounter (HOSPITAL_COMMUNITY): Payer: Self-pay

## 2011-05-26 DIAGNOSIS — T50904A Poisoning by unspecified drugs, medicaments and biological substances, undetermined, initial encounter: Secondary | ICD-10-CM

## 2011-05-26 DIAGNOSIS — R7401 Elevation of levels of liver transaminase levels: Secondary | ICD-10-CM

## 2011-05-26 DIAGNOSIS — R1011 Right upper quadrant pain: Secondary | ICD-10-CM

## 2011-05-26 DIAGNOSIS — G40309 Generalized idiopathic epilepsy and epileptic syndromes, not intractable, without status epilepticus: Secondary | ICD-10-CM

## 2011-05-26 DIAGNOSIS — G40802 Other epilepsy, not intractable, without status epilepticus: Secondary | ICD-10-CM

## 2011-05-26 DIAGNOSIS — T420X1A Poisoning by hydantoin derivatives, accidental (unintentional), initial encounter: Secondary | ICD-10-CM

## 2011-05-26 DIAGNOSIS — R569 Unspecified convulsions: Secondary | ICD-10-CM

## 2011-05-26 DIAGNOSIS — K759 Inflammatory liver disease, unspecified: Secondary | ICD-10-CM

## 2011-05-26 DIAGNOSIS — R7989 Other specified abnormal findings of blood chemistry: Secondary | ICD-10-CM

## 2011-05-26 DIAGNOSIS — R740 Nonspecific elevation of levels of transaminase and lactic acid dehydrogenase [LDH]: Secondary | ICD-10-CM

## 2011-05-26 DIAGNOSIS — R7402 Elevation of levels of lactic acid dehydrogenase (LDH): Secondary | ICD-10-CM

## 2011-05-26 LAB — COMPREHENSIVE METABOLIC PANEL
ALT: 1099 U/L — ABNORMAL HIGH (ref 0–35)
AST: 719 U/L — ABNORMAL HIGH (ref 0–37)
Albumin: 3.3 g/dL — ABNORMAL LOW (ref 3.5–5.2)
Alkaline Phosphatase: 86 U/L (ref 39–117)
BUN: 6 mg/dL (ref 6–23)
CO2: 22 mEq/L (ref 19–32)
Calcium: 8.5 mg/dL (ref 8.4–10.5)
Chloride: 108 mEq/L (ref 96–112)
Creatinine, Ser: 0.47 mg/dL — ABNORMAL LOW (ref 0.50–1.10)
GFR calc Af Amer: >90 mL/min (ref >90)
GFR calc non Af Amer: >90 mL/min (ref >90)
Glucose, Bld: 97 mg/dL (ref 70–99)
Potassium: 3.6 mEq/L (ref 3.5–5.1)
Sodium: 139 mEq/L (ref 135–145)
Total Bilirubin: 0.3 mg/dL (ref 0.3–1.2)
Total Protein: 5.8 g/dL — ABNORMAL LOW (ref 6.0–8.3)

## 2011-05-26 LAB — HEPATITIS PANEL, ACUTE
HCV Ab: NEGATIVE
Hep A IgM: NEGATIVE
Hep B C IgM: NEGATIVE
Hepatitis B Surface Ag: NEGATIVE

## 2011-05-26 MED ORDER — HYDROCORTISONE 1 % EX CREA
TOPICAL_CREAM | Freq: Two times a day (BID) | CUTANEOUS | Status: DC
  Administered 2011-05-26 – 2011-05-27 (×4): via TOPICAL
  Filled 2011-05-26: qty 28

## 2011-05-26 MED ORDER — LEVETIRACETAM 500 MG PO TABS
1000.0000 mg | ORAL_TABLET | Freq: Two times a day (BID) | ORAL | Status: DC
  Administered 2011-05-27: 1000 mg via ORAL
  Filled 2011-05-26 (×2): qty 2

## 2011-05-26 MED ORDER — DOCUSATE SODIUM 100 MG PO CAPS
200.0000 mg | ORAL_CAPSULE | Freq: Every day | ORAL | Status: DC
  Administered 2011-05-27: 200 mg via ORAL
  Filled 2011-05-26 (×2): qty 2

## 2011-05-26 NOTE — Progress Notes (Signed)
[facility] Daily Rounding Note 05/26/2011, 8:54 AM  SUBJECTIVE:       Still has discomfort in mid, lower belly.  BMs brown, last was yesterday.  Generally has constipation and uses 2 colace daily, prn po Dulcolax. No nausea.  apetite diminished, not new for her. Itching and rash on right arm and itching right leg.  She fell into weedy area post seizure PTA.  Propably got exposed to poison ivy.   OBJECTIVE:        General: non jaundiced.  Looks old for age.      Vital signs in last 24 hours:    Temp:  [98.1 F (36.7 C)-99.9 F (37.7 C)] 99.1 F (37.3 C) (05/13 0500) Pulse Rate:  [75-87] 81  (05/13 0500) Resp:  [16-20] 16  (05/13 0500) BP: (99-129)/(65-80) 106/70 mmHg (05/13 0500) SpO2:  [95 %-98 %] 95 % (05/13 0500) Last BM Date: 05/24/11  Heart: RRR Chest: clear.  No SOB or cough Abdomen: soft, ND.  Tender in low mid abdomen, no mass.  Active BS  Extremities: no edema Neuro/Psych:  Pleasant.  No tremor.  Derm:  Raised punctate lesions, some  Excoriated on right forearm  Intake/Output from previous day: 05/12 0701 - 05/13 0700 In: 1507.5 [I.V.:1400; IV Piggyback:107.5] Out: -   Intake/Output this shift:    Lab Results:  Basename 05/25/11 0630 05/24/11 1925  WBC 7.7 12.5*  HGB 11.8* 11.8*  HCT 34.3* 33.8*  PLT 238 253   BMET  Basename 05/26/11 0645 05/25/11 0630 05/24/11 1925  NA 139 140 137  K 3.6 3.1* 3.5  CL 108 103 101  CO2 22 22 21   GLUCOSE 97 100* 99  BUN 6 7 9   CREATININE 0.47* 0.53 0.50  CALCIUM 8.5 9.2 9.4   LFT  Basename 05/26/11 0645 05/25/11 0708 05/24/11 1925  PROT 5.8* 6.4 6.6  ALBUMIN 3.3* 4.0 4.3  AST 719* 2069* 987*  ALT 1099* 1424* 556*  ALKPHOS 86 101 100  BILITOT 0.3 0.4 0.3  BILIDIR -- <0.1 --  IBILI -- NOT CALCULATED --   PT/INR  Basename 05/24/11 2127  LABPROT 14.5  INR 1.11   Hepatitis Panel  Basename 05/25/11 1536  HEPBSAG NEGATIVE  HCVAB NEGATIVE  HEPAIGM NEGATIVE  HEPBIGM NEGATIVE    Studies/Results: Dg  Hip Complete Right  05/24/2011  *RADIOLOGY REPORT*  Clinical Data: Fall.  Right hip pain.  RIGHT HIP - COMPLETE 2+ VIEW  Comparison: CT abdomen and pelvis 03/02/2007.  Findings: Left total hip replacement is noted. Note is made that there is thinning of the medial wall of the left acetabulum.  One of the screws in the acetabular component projects into the soft tissues of the pelvis.  The right hip is located.  There is no fracture.  IMPRESSION:  1.  Negative for fracture. 2.  Left hip replacement is unchanged in appearance.  Original Report Authenticated By: Bernadene Bell. D'ALESSIO, M.D.   Dg Ankle Complete Right  05/24/2011  *RADIOLOGY REPORT*  Clinical Data: Fall.  Lateral ankle pain.  RIGHT ANKLE - COMPLETE 3+ VIEW  Comparison: None.  Findings: No acute bony or joint abnormality is identified.  Pins in the distal first metatarsal consistent with hallux valgus repair noted.  IMPRESSION: No acute finding.  Original Report Authenticated By: Bernadene Bell. D'ALESSIO, M.D.    ASSESMENT: 1.  Acute hepatitis.  Likely drug-induced in pt on multiple anti-seizure meds. Transaminases are trending down.  Viral serologies negative.  US abdomen with Dopplers  ordered, not completed.  2.  Seizure disorder.  Vimpat and Phenytoin d/c'd.  Keppra started. On lamictal.  3.  R/o drug induced Rhabdo:  Renal function and CKs normal, so this is ruled out.   PLAN: 1.  LFTs in AM. 2.  IVF to [facility]. 3.  Add colace per home routine.    LOS: 2 days   [name]  05/26/2011, 8:54 AM Pager: [phone]   [facility] Attending  I have also seen, examined and interviewed the patient. Note above reviewed.  Changes are: rash/pruritis is on upper extremities with mild rash also in suprasternal rrea 9x 1 month this one). She is not allergic to poison ivy.  I suspect drug-induced hypersensitivity - most likely from phenytoin as dose was increased recently.   Await [name] results.  If that is ok (suspect so) then would discharge soon  (when neuro issues settled for that) as liver issues not keeping her here.  She can have follow-up of LFT's to normal in PCP ([facility]) with any ?'s directed to GI but she should not necessarily need GI specialty follow-up based upon what we know now.  Will need LFT's within 1 week of dc.  Monica Boop, MD, [facility] [phone] (pager) 05/26/2011 10:43 AM

## 2011-05-26 NOTE — Progress Notes (Signed)
UR Completed Chanse Kagel Graves-Bigelow, RN,BSN [phone]  

## 2011-05-26 NOTE — Care Management Note (Signed)
    Page 1 of 1   05/28/2011     3:05:36 PM   CARE MANAGEMENT NOTE 05/28/2011  Patient:  [name], [name]   Account Number:  [id number]  Date Initiated:  05/26/2011  Documentation initiated by:  GRAVES-BIGELOW,BRENDA  Subjective/Objective Assessment:   Pt admitted with stomach pain emesis- seizures. Pt is from home with husband.     Action/Plan:   Anticipated DC Date:  05/28/2011   Anticipated DC Plan:  HOME/SELF CARE      DC Planning Services  CM consult      Choice offered to / List presented to:             Status of service:  Completed, signed off Medicare Important Message given?   (If response is "NO", the following Medicare IM given date fields will be blank) Date Medicare IM given:   Date Additional Medicare IM given:    Discharge Disposition:  HOME/SELF CARE  Per UR Regulation:  Reviewed for med. necessity/level of care/duration of stay  If discussed at Long Length of Stay Meetings, dates discussed:    Comments:  05-26-11 Tomi Bamberger, RN,BSN [phone] CM will continue to monitor for disposition needs.

## 2011-05-26 NOTE — Progress Notes (Signed)
Subjective: Patient seen and examined this am. abdominal pain better today.   Objective:  Vital signs in last 24 hours:  Filed Vitals:   05/25/11 1300 05/25/11 1434 05/25/11 2100 05/26/11 0500  BP: 129/80 103/70 99/65 106/70  Pulse: 75 87 85 81  Temp:  98.1 F (36.7 C) 99.9 F (37.7 C) 99.1 F (37.3 C)  TempSrc:      Resp:  20 20 16   Height:      Weight:      SpO2: 98% 96% 96% 95%    Intake/Output from previous day:   Intake/Output Summary (Last 24 hours) at 05/26/11 1331 Last data filed at 05/25/11 2100  Gross per 24 hour  Intake  907.5 ml  Output      0 ml  Net  907.5 ml    Physical Exam:  General: middle aged female in no acute distress.  HEENT: no pallor, no icterus, moist oral mucosa, no JVD, no lymphadenopathy  Heart: Normal s1 &s2 Regular rate and rhythm, without murmurs, rubs, gallops.  Lungs: Clear to auscultation bilaterally.  Abdomen: Soft, mild tenderness over epigastric and RUQ On deep palpation, nondistended, positive bowel sounds.  Extremities: No clubbing cyanosis or edema with positive pedal pulses.  Neuro: Alert, awake, oriented x3, nonfocal.    Lab Results:  Basic Metabolic Panel:    Component Value Date/Time   NA 139 05/26/2011 0645   K 3.6 05/26/2011 0645   CL 108 05/26/2011 0645   CO2 22 05/26/2011 0645   BUN 6 05/26/2011 0645   CREATININE 0.47* 05/26/2011 0645   GLUCOSE 97 05/26/2011 0645   CALCIUM 8.5 05/26/2011 0645   CBC:    Component Value Date/Time   WBC 7.7 05/25/2011 0630   HGB 11.8* 05/25/2011 0630   HCT 34.3* 05/25/2011 0630   PLT 238 05/25/2011 0630   MCV 84.3 05/25/2011 0630   NEUTROABS 10.3* 05/24/2011 1925   LYMPHSABS 1.5 05/24/2011 1925   MONOABS 0.6 05/24/2011 1925   EOSABS 0.0 05/24/2011 1925   BASOSABS 0.0 05/24/2011 1925    No results found for this or any previous visit (from the past 240 hour(s)).  Studies/Results: Dg Hip Complete Right  05/24/2011  *RADIOLOGY REPORT*  Clinical Data: Fall.  Right hip pain.  RIGHT  HIP - COMPLETE 2+ VIEW  Comparison: CT abdomen and pelvis 03/02/2007.  Findings: Left total hip replacement is noted. Note is made that there is thinning of the medial wall of the left acetabulum.  One of the screws in the acetabular component projects into the soft tissues of the pelvis.  The right hip is located.  There is no fracture.  IMPRESSION:  1.  Negative for fracture. 2.  Left hip replacement is unchanged in appearance.  Original Report Authenticated By: Bernadene Bell. D'ALESSIO, M.D.   Dg Ankle Complete Right  05/24/2011  *RADIOLOGY REPORT*  Clinical Data: Fall.  Lateral ankle pain.  RIGHT ANKLE - COMPLETE 3+ VIEW  Comparison: None.  Findings: No acute bony or joint abnormality is identified.  Pins in the distal first metatarsal consistent with hallux valgus repair noted.  IMPRESSION: No acute finding.  Original Report Authenticated By: Bernadene Bell. Maricela Curet, M.D.    Medications: Scheduled Meds:   . baclofen  5 mg Oral TID  . clonazePAM  0.5 mg Oral QHS  . docusate sodium  200 mg Oral Daily  . enoxaparin  40 mg Subcutaneous Q24H  . hydrocortisone cream   Topical BID  . lamoTRIgine  200 mg Oral BID  .  levetiracetam  750 mg Intravenous Q12H  . levETIRAcetam  1,000 mg Oral BID  . PARoxetine  20 mg Oral QHS  . sodium chloride  3 mL Intravenous Q12H   Continuous Infusions:   . sodium chloride 100 mL/hr at 05/26/11 0114   PRN Meds:.alum & mag hydroxide-simeth, HYDROmorphone (DILAUDID) injection, ondansetron (ZOFRAN) IV, ondansetron, oxyCODONE  Assessment: 46 y/o female with hx of seizure disorder admitted with episodes of nausea and vomiting admitted with 2 episodes of seizure and transaminitis.   Plan: *Seizures  Noted for sub therapeutic dilantin level. She had low levels 1 mth back and dose increased. Denies missing any dosage. Given transaminitis dilantin and vimpat ( lacosamide ) held. Subsequent LFTs worsened with AST>2000 and ALT of 1400, normal total bili and  alkP. patient also  on vimpat  for seizures which is contraindicated for use in severe transaminitis which will hold as well  continue lamictal.  patient follows with Dr love as outpatient for her seizures  appreciate neuro recommendations. Started on keppra IV 750 mg bid and increased to 1000 mg bid today. Will switch to po tomorrow. Patient was started on keppra previously but could not take it due to insurance. As per neur since keppra is now generic so this  should not be a problem. Will discuss with Dr Sandria Manly prior to discharge.   Transaminitis  possibly related to dilantin toxicity . Hold vimpat as well.  Subsequent AST and ALT worsened with RUQ pain. pending USG RUQ and doppler as well. Tylenol level, salicylate level wnl, hepatitis panel negative.  GI evaluated patient, also feel its dilantin induced . Can be dced with outpt follow up of liver fn if USG negative. LFTs improving this am   abdominal pain with nausea and vomiting  D/c fluids,  prn zofran. Pain control. Cont maalox prn  symptoms better today Replenished k  DVT prophylaxis    LOS: 2 days   [name] 05/26/2011, 1:31 PM

## 2011-05-26 NOTE — Progress Notes (Signed)
[facility] PROGRESS NOTE    SUBJECTIVE   No further seizure activity.  Doing well on increased dose of Keppra 750 mg BID.  OBJECTIVE   Vital signs in last 24 hours: Temp:  [98.1 F (36.7 C)-99.9 F (37.7 C)] 99.1 F (37.3 C) (05/13 0500) Pulse Rate:  [75-87] 81  (05/13 0500) Resp:  [16-20] 16  (05/13 0500) BP: (99-129)/(65-80) 106/70 mmHg (05/13 0500) SpO2:  [95 %-98 %] 95 % (05/13 0500)  Intake/Output from previous day: 05/12 0701 - 05/13 0700 In: 1507.5 [I.V.:1400; IV Piggyback:107.5] Out: -  Intake/Output this shift:   Nutritional status: NPO  Past Medical History  Diagnosis Date  . Epilepsy   . Seizures   . Vertigo   . Constipation     Neurologic Exam:   Mental Status: Alert, oriented, thought content appropriate.  Speech fluent without evidence of aphasia. Able to follow 3 step commands without difficulty. Cranial Nerves: II-Visual fields grossly intact. III/IV/VI-Extraocular movements intact.  Pupils reactive bilaterally. V/VII-Smile symmetric VIII-grossly intact IX/X-normal gag XI-bilateral shoulder shrug XII-midline tongue extension Motor: 5/5 bilaterally with normal tone and bulk Sensory: Pinprick and light touch intact throughout, bilaterally Deep Tendon Reflexes: 2+ and symmetric throughout Plantars: Downgoing bilaterally Cerebellar: Normal finger-to-nose, normal rapid alternating movements and normal heel-to-shin test.  Normal gait and station.   Lab Results: No results found for this basename: cbc, bmp, coags, chol, tri, ldl, hga1c   Lipid Panel No results found for this basename: CHOL,TRIG,HDL,CHOLHDL,VLDL,LDLCALC in the last 72 hours  Studies/Results: Dg Hip Complete Right  05/24/2011  *RADIOLOGY REPORT*  Clinical Data: Fall.  Right hip pain.  RIGHT HIP - COMPLETE 2+ VIEW  Comparison: CT abdomen and pelvis 03/02/2007.  Findings: Left total hip replacement is noted. Note is made that there  is thinning of the medial wall of the left acetabulum.  One of the screws in the acetabular component projects into the soft tissues of the pelvis.  The right hip is located.  There is no fracture.  IMPRESSION:  1.  Negative for fracture. 2.  Left hip replacement is unchanged in appearance.  Original Report Authenticated By: Bernadene Bell. D'ALESSIO, M.D.   Dg Ankle Complete Right  05/24/2011  *RADIOLOGY REPORT*  Clinical Data: Fall.  Lateral ankle pain.  RIGHT ANKLE - COMPLETE 3+ VIEW  Comparison: None.  Findings: No acute bony or joint abnormality is identified.  Pins in the distal first metatarsal consistent with hallux valgus repair noted.  IMPRESSION: No acute finding.  Original Report Authenticated By: Bernadene Bell. Maricela Curet, M.D.    Medications:     Scheduled:   . baclofen  5 mg Oral TID  . clonazePAM  0.5 mg Oral QHS  . docusate sodium  200 mg Oral Daily  . enoxaparin  40 mg Subcutaneous Q24H  . hydrocortisone cream   Topical BID  . lamoTRIgine  200 mg Oral BID  . levetiracetam  750 mg Intravenous Q12H  . levETIRAcetam  1,000 mg Oral BID  . PARoxetine  20 mg Oral QHS  . 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  Patient was admitted to hospital due to abdominal  discomfort and neurology was asked to consult for AED. LFT were found to be elevated thus Dilantin was discontinued.      Plan: Will continue with Lamictal and Keppra, increasing to 1 gram BID PO tomorrow.  I have discussed with Dr. Elita Boone.  Patient should follow up with [name] as out patient.   --CMP ordered for tomorrow AM at 0500  I have discussed patient with Dr.Nash and he has seen the patient and agrees with the above mentioned.   Neurology will S/O--fell free to call with any questions.  Felicie Morn PA-C [facility] [phone]  05/26/2011, 12:23 PM

## 2011-05-26 NOTE — Consult Note (Signed)
Patient seen, examined, and I agree with the above documentation, including the assessment and plan. Await abd [name] with doppler Check viral hep panel Though, most likely this is drug-induced liver injury.  Hold hepatotoxins Follow LFTs, INR daily.

## 2011-05-27 DIAGNOSIS — R7402 Elevation of levels of lactic acid dehydrogenase (LDH): Secondary | ICD-10-CM

## 2011-05-27 DIAGNOSIS — R740 Nonspecific elevation of levels of transaminase and lactic acid dehydrogenase [LDH]: Secondary | ICD-10-CM

## 2011-05-27 DIAGNOSIS — R7401 Elevation of levels of liver transaminase levels: Secondary | ICD-10-CM

## 2011-05-27 DIAGNOSIS — T420X1A Poisoning by hydantoin derivatives, accidental (unintentional), initial encounter: Secondary | ICD-10-CM

## 2011-05-27 DIAGNOSIS — R1011 Right upper quadrant pain: Secondary | ICD-10-CM

## 2011-05-27 DIAGNOSIS — G40309 Generalized idiopathic epilepsy and epileptic syndromes, not intractable, without status epilepticus: Secondary | ICD-10-CM

## 2011-05-27 LAB — COMPREHENSIVE METABOLIC PANEL
ALT: 673 U/L — ABNORMAL HIGH (ref 0–35)
AST: 214 U/L — ABNORMAL HIGH (ref 0–37)
Albumin: 3.4 g/dL — ABNORMAL LOW (ref 3.5–5.2)
Alkaline Phosphatase: 86 U/L (ref 39–117)
BUN: 6 mg/dL (ref 6–23)
CO2: 28 mEq/L (ref 19–32)
Calcium: 8.9 mg/dL (ref 8.4–10.5)
Chloride: 105 mEq/L (ref 96–112)
Creatinine, Ser: 0.56 mg/dL (ref 0.50–1.10)
GFR calc Af Amer: >90 mL/min (ref >90)
GFR calc non Af Amer: >90 mL/min (ref >90)
Glucose, Bld: 100 mg/dL — ABNORMAL HIGH (ref 70–99)
Potassium: 3.8 mEq/L (ref 3.5–5.1)
Sodium: 141 mEq/L (ref 135–145)
Total Bilirubin: 0.2 mg/dL — ABNORMAL LOW (ref 0.3–1.2)
Total Protein: 6.1 g/dL (ref 6.0–8.3)

## 2011-05-27 MED ORDER — GABAPENTIN 600 MG PO TABS
600.0000 mg | ORAL_TABLET | Freq: Two times a day (BID) | ORAL | Status: DC
  Administered 2011-05-27 – 2011-05-28 (×3): 600 mg via ORAL
  Filled 2011-05-27 (×5): qty 1

## 2011-05-27 NOTE — Progress Notes (Signed)
[facility] Daily Rounding Note 05/27/2011, 9:26 AM  SUBJECTIVE:       Feels better.  Still some discomfort in lower belly.   No nausea today, had some GI upset after eating "Philly chees steak" sandwich last night. Last BM 2 days ago, normis q 2 to 3 days. No dizzyness  OBJECTIVE:        General: looks well      Vital signs in last 24 hours:    Temp:  [97.6 F (36.4 C)-98.7 F (37.1 C)] 98 F (36.7 C) (05/14 0800) Pulse Rate:  [64-79] 64  (05/14 0800) Resp:  [18] 18  (05/14 0800) BP: (101-113)/(68-76) 105/68 mmHg (05/14 0800) SpO2:  [95 %-98 %] 97 % (05/14 0800) Last BM Date: 05/24/11  Heart: RRR Chest: clear B.  No dyspnea Abdomen: soft, ND, minor discomfort in low abdomen.  Active BS  Extremities: nno pedal edema Neuro/Psych:  Pleasant not drowsy or anxious  Intake/Output from previous day:    Intake/Output this shift:    Lab Results:  Basename 05/25/11 0630 05/24/11 1925  WBC 7.7 12.5*  HGB 11.8* 11.8*  HCT 34.3* 33.8*  PLT 238 253   BMET  Basename 05/27/11 0525 05/26/11 0645 05/25/11 0630  NA 141 139 140  K 3.8 3.6 3.1*  CL 105 108 103  CO2 28 22 22   GLUCOSE 100* 97 100*  BUN 6 6 7   CREATININE 0.56 0.47* 0.53  CALCIUM 8.9 8.5 9.2   LFT  Basename 05/27/11 0525 05/26/11 0645 05/25/11 0708  PROT 6.1 5.8* 6.4  ALBUMIN 3.4* 3.3* 4.0  AST 214* 719* 2069*  ALT 673* 1099* 1424*  ALKPHOS 86 86 101  BILITOT 0.2* 0.3 0.4  BILIDIR -- -- <0.1  IBILI -- -- NOT CALCULATED   PT/INR  Basename 05/24/11 2127  LABPROT 14.5  INR 1.11   Hepatitis Panel  Basename 05/25/11 1536  HEPBSAG NEGATIVE  HCVAB NEGATIVE  HEPAIGM NEGATIVE  HEPBIGM NEGATIVE    Studies/Results: US Abdomen Complete US Art/ven Flow Abd Pelv Doppler 05/26/2011  *RADIOLOGY REPORT*  Clinical Data:  Elevated LFTs  ABDOMINAL ULTRASOUND COMPLETE  Comparison:  03/02/2007 CT  Findings:  Gallbladder:  No gallstones, gallbladder wall thickening, or pericholecystic fluid.  Common Bile Duct:   Within normal limits in caliber.  Liver: Diffuse increased echogenicity suspicious for mild background hepatic steatosis.  No focal hepatic abnormality.  No biliary dilatation.  IVC:  Appears normal.  Pancreas:  No abnormality identified.  Spleen:  Within normal limits in size and echotexture.  Right kidney:  Normal in size and parenchymal echogenicity.  No evidence of mass or hydronephrosis.  Left kidney:  Normal in size and parenchymal echogenicity.  No evidence of mass or hydronephrosis. Small 8 mm echogenic focus in the mid pole with minimal shadowing posteriorly suspicious for nonobstructing left renal calculus.  Abdominal Aorta:  No aneurysm identified.  IMPRESSION: Mild hepatic steatosis  Nonobstructing left nephrolithiasis  No acute finding by abdominal ultrasound  *RADIOLOGY REPORT*  Clinical Data: Elevated LFTs  DUPLEX ULTRASOUND OF LIVER  Technique:  Color and duplex Doppler ultrasound was performed to evaluate the hepatic in-flow and out-flow vessels.  Comparison:  As above  Portal Vein Velocities: Main:      48 cm/sec Right:      33 cm/sec Left:        29 cm/sec  Hepatic Vein Velocities: Right:     66 cm/sec Middle:  50 cm/sec Left:  49 cm/sec  Hepatic Artery Velocity:   87 cm/sec Splenic Vein Velocity:      35 cm/sec  Varices:   Negative Ascites:   Negative  Findings: Portal vein diameter is 10 mm with normal directional flow.  Hepatic and splenic veins are also patent.  IVC patent. Negative for portal or splenic vein occlusive or thrombus.  No ascites or varices.  IMPRESSION: Normal liver Doppler  Original Report Authenticated By: Judie Petit. Ruel Favors, M.D.    ASSESMENT: *  Acute hepatitis, likely drug induced, on multiple seizure meds PTA.  Now on Keppra , which she tolerated in the past.  Ultrasound/dopplers show mild fatty liver, not the cause of acute hepatitis.  LFTs continue to decline back toward normal range     PLAN: *  GI will sign off.  Plan to get outpt LFTs with primary care  office in [facility]. GI follow up not needed unless primary care notes LFTs do not ultimatley normalize, which may take several weeks. *  Advance diet   LOS: 3 days   Meagon Duskin  05/27/2011, 9:26 AM Pager: [phone]

## 2011-05-27 NOTE — Progress Notes (Signed)
Agree with Ms. Gribbin's assessment and plan. Hearl Heikes E. Cai Flott, MD, FACG  

## 2011-05-27 NOTE — Progress Notes (Signed)
Pt transferred to 3000 in wheelchair in stable condition. Assessment unchanged from this am.Family at bedside

## 2011-05-27 NOTE — Progress Notes (Signed)
Subjective: Patient seen and examined this am. RUQ pain and soreness better. No epigastric discomfort.    Objective:  Vital signs in last 24 hours:  Filed Vitals:   05/26/11 2100 05/27/11 0000 05/27/11 0400 05/27/11 0800  BP: 111/75 101/70 108/75 105/68  Pulse: 76 76 64 64  Temp: 98 F (36.7 C) 98.7 F (37.1 C) 98.3 F (36.8 C) 98 F (36.7 C)  TempSrc: Oral Oral Oral Oral  Resp: 18 18 18 18   Height:      Weight:      SpO2: 96% 98% 96% 97%    Intake/Output from previous day:  No intake or output data in the 24 hours ending 05/27/11 1035  Physical Exam:  General: middle aged female in no acute distress.  HEENT: no pallor, no icterus, moist oral mucosa, no JVD, no lymphadenopathy  Heart: Normal s1 &s2 Regular rate and rhythm, without murmurs, rubs, gallops.  Lungs: Clear to auscultation bilaterally.  Abdomen: Soft, mild tenderness over  RUQ On deep palpation ( much improved and epigastric tenderness resolved), nondistended, positive bowel sounds.  Extremities: No clubbing cyanosis or edema with positive pedal pulses.  Neuro: Alert, awake, oriented x3, nonfocal.   Lab Results:  Basic Metabolic Panel:    Component Value Date/Time   NA 141 05/27/2011 0525   K 3.8 05/27/2011 0525   CL 105 05/27/2011 0525   CO2 28 05/27/2011 0525   BUN 6 05/27/2011 0525   CREATININE 0.56 05/27/2011 0525   GLUCOSE 100* 05/27/2011 0525   CALCIUM 8.9 05/27/2011 0525   CBC:    Component Value Date/Time   WBC 7.7 05/25/2011 0630   HGB 11.8* 05/25/2011 0630   HCT 34.3* 05/25/2011 0630   PLT 238 05/25/2011 0630   MCV 84.3 05/25/2011 0630   NEUTROABS 10.3* 05/24/2011 1925   LYMPHSABS 1.5 05/24/2011 1925   MONOABS 0.6 05/24/2011 1925   EOSABS 0.0 05/24/2011 1925   BASOSABS 0.0 05/24/2011 1925    No results found for this or any previous visit (from the past 240 hour(s)).  Studies/Results: US Abdomen Complete  05/26/2011  *RADIOLOGY REPORT*  Clinical Data:  Elevated LFTs  ABDOMINAL ULTRASOUND  COMPLETE  Comparison:  03/02/2007 CT  Findings:  Gallbladder:  No gallstones, gallbladder wall thickening, or pericholecystic fluid.  Common Bile Duct:  Within normal limits in caliber.  Liver: Diffuse increased echogenicity suspicious for mild background hepatic steatosis.  No focal hepatic abnormality.  No biliary dilatation.  IVC:  Appears normal.  Pancreas:  No abnormality identified.  Spleen:  Within normal limits in size and echotexture.  Right kidney:  Normal in size and parenchymal echogenicity.  No evidence of mass or hydronephrosis.  Left kidney:  Normal in size and parenchymal echogenicity.  No evidence of mass or hydronephrosis. Small 8 mm echogenic focus in the mid pole with minimal shadowing posteriorly suspicious for nonobstructing left renal calculus.  Abdominal Aorta:  No aneurysm identified.  IMPRESSION: Mild hepatic steatosis  Nonobstructing left nephrolithiasis  No acute finding by abdominal ultrasound  *RADIOLOGY REPORT*  Clinical Data: Elevated LFTs  DUPLEX ULTRASOUND OF LIVER  Technique:  Color and duplex Doppler ultrasound was performed to evaluate the hepatic in-flow and out-flow vessels.  Comparison:  As above  Portal Vein Velocities: Main:      48 cm/sec Right:      33 cm/sec Left:        29 cm/sec  Hepatic Vein Velocities: Right:     66 cm/sec Middle:  50 cm/sec Left:  49 cm/sec  Hepatic Artery Velocity:   87 cm/sec Splenic Vein Velocity:      35 cm/sec  Varices:   Negative Ascites:   Negative  Findings: Portal vein diameter is 10 mm with normal directional flow.  Hepatic and splenic veins are also patent.  IVC patent. Negative for portal or splenic vein occlusive or thrombus.  No ascites or varices.  IMPRESSION: Normal liver Doppler  Original Report Authenticated By: Judie Petit. Ruel Favors, M.D.   [name] Art/ven Flow Abd Pelv Doppler  05/26/2011  *RADIOLOGY REPORT*  Clinical Data:  Elevated LFTs  ABDOMINAL ULTRASOUND COMPLETE  Comparison:  03/02/2007 CT  Findings:  Gallbladder:  No  gallstones, gallbladder wall thickening, or pericholecystic fluid.  Common Bile Duct:  Within normal limits in caliber.  Liver: Diffuse increased echogenicity suspicious for mild background hepatic steatosis.  No focal hepatic abnormality.  No biliary dilatation.  IVC:  Appears normal.  Pancreas:  No abnormality identified.  Spleen:  Within normal limits in size and echotexture.  Right kidney:  Normal in size and parenchymal echogenicity.  No evidence of mass or hydronephrosis.  Left kidney:  Normal in size and parenchymal echogenicity.  No evidence of mass or hydronephrosis. Small 8 mm echogenic focus in the mid pole with minimal shadowing posteriorly suspicious for nonobstructing left renal calculus.  Abdominal Aorta:  No aneurysm identified.  IMPRESSION: Mild hepatic steatosis  Nonobstructing left nephrolithiasis  No acute finding by abdominal ultrasound  *RADIOLOGY REPORT*  Clinical Data: Elevated LFTs  DUPLEX ULTRASOUND OF LIVER  Technique:  Color and duplex Doppler ultrasound was performed to evaluate the hepatic in-flow and out-flow vessels.  Comparison:  As above  Portal Vein Velocities: Main:      48 cm/sec Right:      33 cm/sec Left:        29 cm/sec  Hepatic Vein Velocities: Right:     66 cm/sec Middle:  50 cm/sec Left:       49 cm/sec  Hepatic Artery Velocity:   87 cm/sec Splenic Vein Velocity:      35 cm/sec  Varices:   Negative Ascites:   Negative  Findings: Portal vein diameter is 10 mm with normal directional flow.  Hepatic and splenic veins are also patent.  IVC patent. Negative for portal or splenic vein occlusive or thrombus.  No ascites or varices.  IMPRESSION: Normal liver Doppler  Original Report Authenticated By: Judie Petit. Ruel Favors, M.D.    Medications: Scheduled Meds:   . baclofen  5 mg Oral TID  . clonazePAM  0.5 mg Oral QHS  . docusate sodium  200 mg Oral Daily  . enoxaparin  40 mg Subcutaneous Q24H  . gabapentin  600 mg Oral BID  . hydrocortisone cream   Topical BID  . lamoTRIgine   200 mg Oral BID  . levetiracetam  750 mg Intravenous Q12H  . PARoxetine  20 mg Oral QHS  . sodium chloride  3 mL Intravenous Q12H  . DISCONTD: levETIRAcetam  1,000 mg Oral BID   Continuous Infusions:   . sodium chloride 20 mL/hr (05/26/11 1412)   PRN Meds:.alum & mag hydroxide-simeth, HYDROmorphone (DILAUDID) injection, ondansetron (ZOFRAN) IV, ondansetron, oxyCODONE  Assessment:  46 y/o female with hx of seizure disorder admitted with episodes of nausea and vomiting admitted with 2 episodes of seizure and transaminitis.   Plan:   *Seizures  -Noted for sub therapeutic dilantin level.  Denies missing any dosage. On further confirming she was admitted here in march and started on  dilantin at that time only. Given transaminitis dilantin  held. Subsequent LFTs worsened with AST>2000 and ALT of 1400, normal total bili and alkP. patient also on vimpat for seizures which is contraindicated for use in severe transaminitis which will hold as well  continue lamictal.  patient follows with Dr love as outpatient for her seizures. 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 Dr Sandria Manly agrees with the same.   appreciate neuro recommendations ( seen by Dr Elita Boone) . Started on keppra IV 750 mg bid and increased to 1000 mg bid .   -I Discussed with her neurologist Dr Sandria Manly today. Patient was started on keppra in 2012 but had seizures on the generic drug and was dced. He recommends to start her on gabapentin instead at 600 mg bid for 2 days then 600 mg tid for next 3-4 days followed by 600 mg qid thereafter if tolerated . Patient will be monitored for 24 hours on gabapentin and discharged tomorrow if stable He will follow her in the clinic in 1 week( patient to call his office for appt) and needs follow up LFTs  Transaminitis  -Most likely  related to dilantin toxicity and dilantin was introduced for her seizure only 2 months back  . Hold vimpat as well.  -Subsequent  AST and ALT worsened with RUQ pain on day 1 . AST in 2000 and ALT of 1400  -USG RUQ shows only fatty changes and doppler RUQ wnl . Tylenol level, salicylate level wnl, hepatitis panel negative.  -[facility] evaluated patient, also feel its dilantin induced .  Marland Kitchen- LFTs improving this am   abdominal pain with nausea and vomiting  Much improved RUQ pain, N/V resolved  D/ced fluids, prn zofran. Pain control. Cont maalox prn  Replenished k  Advanced diet   DVT prophylaxis    Consults:  neurologyElita Boone) GI ( [facility])  dispo Being monitored on gabapentin for seizures for at least 24 hrs ( started today) and if tolerated can be discharged home tomorrow  on gabapentin (  600 mg bid for 2 days then 600 mg tid for next 3-4 days followed by 600 mg qid thereafter if tolerated) with outpt follow up with Dr Sandria Manly ( [facility]) in 1 week. Needs repeat LFTs then .     LOS: 3 days   Darlean Warmoth 05/27/2011, 10:35 AM

## 2011-05-28 DIAGNOSIS — T420X1A Poisoning by hydantoin derivatives, accidental (unintentional), initial encounter: Secondary | ICD-10-CM

## 2011-05-28 DIAGNOSIS — R7401 Elevation of levels of liver transaminase levels: Secondary | ICD-10-CM

## 2011-05-28 DIAGNOSIS — R740 Nonspecific elevation of levels of transaminase and lactic acid dehydrogenase [LDH]: Secondary | ICD-10-CM

## 2011-05-28 DIAGNOSIS — R1011 Right upper quadrant pain: Secondary | ICD-10-CM

## 2011-05-28 DIAGNOSIS — R7402 Elevation of levels of lactic acid dehydrogenase (LDH): Secondary | ICD-10-CM

## 2011-05-28 DIAGNOSIS — G40309 Generalized idiopathic epilepsy and epileptic syndromes, not intractable, without status epilepticus: Secondary | ICD-10-CM

## 2011-05-28 LAB — HEPATIC FUNCTION PANEL
ALT: 469 U/L — ABNORMAL HIGH (ref 0–35)
AST: 93 U/L — ABNORMAL HIGH (ref 0–37)
Albumin: 3.6 g/dL (ref 3.5–5.2)
Alkaline Phosphatase: 86 U/L (ref 39–117)
Bilirubin, Direct: <0.1 mg/dL (ref 0.0–0.3)
Total Bilirubin: 0.3 mg/dL (ref 0.3–1.2)
Total Protein: 6.4 g/dL (ref 6.0–8.3)

## 2011-05-28 MED ORDER — GABAPENTIN 600 MG PO TABS: ORAL_TABLET | ORAL | Status: DC

## 2011-05-28 NOTE — Discharge Summary (Signed)
Physician Discharge Summary  [name] NFA:[id number] DOB: 1965/03/14 DOA: 05/24/2011  PCP: Mathis Fare, OTR, OTR  Admit date: 05/24/2011 Discharge date: 05/28/2011  Discharge Diagnoses:  Seizures  Drug-induced hepatic toxicity (05/24/2011) Leukocytosis, resolved.     Discharge Condition: Improved.   Disposition: Needs to follow up up with Dr Sandria Manly.   History of present illness:  [name] is an 46 y.o. female who presented to the [facility] ED due to severe headaches and 2 episodes of witness seizures earlier in the day. 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 She denies having any fevers or chills, nausea, vomiting or diarrhea. When she was evaluated in the ED her laboratory studies revealed an elevation in her Transaminases. She denies any history of alcohol or drug use, so it was thought that the etiology may be due to her seizure medications or viral in origin. She was transferred for further evaluation   Hospital Course:  1- Seizures  -Noted for sub therapeutic dilantin level. Denies missing any dosage. On further confirming she was admitted here in march and started on dilantin at that time only. Given transaminitis dilantin held. Subsequent LFTs worsened with AST>2000 and ALT of 1400, normal total bili and alkP. patient also on vimpat for seizures which is contraindicated for use in severe transaminitis which will hold as well continue lamictal. Patient follows with Dr love as outpatient for her seizures. 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 Dr Sandria Manly agrees with the same.  appreciate neuro recommendations ( seen by Dr Elita Boone) . Started on keppra IV 750 mg bid and increased to 1000 mg bid .  -Dr Windy Canny  Discussed with her neurologist Dr Sandria Manly. Patient was started on keppra in 2012 but had seizures on the generic drug and was dced. He recommends to start  her on gabapentin instead at 600 mg bid for 2 days then 600 mg tid for next 3-4 days followed by 600 mg qid thereafter if tolerated. Patient was monitor on Gabapentin for 24 hour, no further seizure episodes.  Patient needs to follow up with neurologist within 1 week. and needs follow up LFTs.   2-Drug hepatitis: Transaminitis. -Most likely related to dilantin toxicity and dilantin was introduced for her seizure only 2 months back . Hold vimpat as well.  -Subsequent AST and ALT worsened with RUQ pain on day 1 . AST in 2000 and ALT of 1400  -USG RUQ shows only fatty changes and doppler RUQ wnl . Tylenol level, salicylate level wnl, hepatitis panel negative.  -[facility] evaluated patient, also feel its dilantin induced .  Marland Kitchen- LFTs improving. Gi recommend repeat LFT, if no resolution in several weeks, patient will need to follow up with GI.  3-abdominal pain with nausea and vomiting  Much improved RUQ pain, N/V resolved  D/ced fluids, prn zofran. Pain control. Cont maalox prn. Tolerating diet.   Consults:  neurologyElita Boone)  GI ( [facility])     Discharge Exam: Filed Vitals:   05/28/11 0957  BP: 95/65  Pulse: 62  Temp: 98.6 F (37 C)  Resp: 20   Filed Vitals:   05/27/11 2104 05/28/11 0141 05/28/11 0501 05/28/11 0957  BP: 99/65 112/76 106/72 95/65  Pulse: 73 74 68 62  Temp: 98.6 F (37 C) 99.7 F (37.6 C) 98.2 F (36.8 C) 98.6 F (37 C)  TempSrc: Oral Oral Oral   Resp: 20 20 18  20  Height:      Weight:      SpO2: 97% 94% 93% 95%   General: Patient is in no acute distress.  Cardiovascular: S1, S2 RRR.  Respiratory: CTA.   Discharge Instructions  Discharge Orders    Future Orders Please Complete By Expires   Diet - low sodium heart healthy      Increase activity slowly        Medication List  As of 05/28/2011  1:43 PM   STOP taking these medications         Lacosamide 100 MG Tabs      phenytoin 100 MG ER capsule         TAKE these medications         clonazePAM  0.5 MG tablet   Commonly known as: KLONOPIN   Take 0.5 mg by mouth at bedtime. For sleep      gabapentin 600 MG tablet   Commonly known as: NEURONTIN   Take 1 tablet twice a day for 2 day then 1 tablet three times a day for 4 days then 1 tablet four times a day.      ibuprofen 800 MG tablet   Commonly known as: ADVIL,MOTRIN   Take 800 mg by mouth every 8 (eight) hours as needed. For pain      lamoTRIgine 200 MG tablet   Commonly known as: LAMICTAL   Take 200 mg by mouth 2 (two) times daily.      PARoxetine 20 MG tablet   Commonly known as: PAXIL   Take 1 tablet (20 mg total) by mouth at bedtime.              The results of significant diagnostics from this hospitalization (including imaging, microbiology, ancillary and laboratory) are listed below for reference.    Significant Diagnostic Studies: Dg Hip Complete Right  05/24/2011  *RADIOLOGY REPORT*  Clinical Data: Fall.  Right hip pain.  RIGHT HIP - COMPLETE 2+ VIEW  Comparison: CT abdomen and pelvis 03/02/2007.  Findings: Left total hip replacement is noted. Note is made that there is thinning of the medial wall of the left acetabulum.  One of the screws in the acetabular component projects into the soft tissues of the pelvis.  The right hip is located.  There is no fracture.  IMPRESSION:  1.  Negative for fracture. 2.  Left hip replacement is unchanged in appearance.  Original Report Authenticated By: Bernadene Bell. D'ALESSIO, M.D.   Dg Ankle Complete Right  05/24/2011  *RADIOLOGY REPORT*  Clinical Data: Fall.  Lateral ankle pain.  RIGHT ANKLE - COMPLETE 3+ VIEW  Comparison: None.  Findings: No acute bony or joint abnormality is identified.  Pins in the distal first metatarsal consistent with hallux valgus repair noted.  IMPRESSION: No acute finding.  Original Report Authenticated By: Bernadene Bell. Maricela Curet, M.D.   US Abdomen Complete  05/26/2011  *RADIOLOGY REPORT*  Clinical Data:  Elevated LFTs  ABDOMINAL ULTRASOUND COMPLETE   Comparison:  03/02/2007 CT  Findings:  Gallbladder:  No gallstones, gallbladder wall thickening, or pericholecystic fluid.  Common Bile Duct:  Within normal limits in caliber.  Liver: Diffuse increased echogenicity suspicious for mild background hepatic steatosis.  No focal hepatic abnormality.  No biliary dilatation.  IVC:  Appears normal.  Pancreas:  No abnormality identified.  Spleen:  Within normal limits in size and echotexture.  Right kidney:  Normal in size and parenchymal echogenicity.  No evidence of mass or hydronephrosis.  Left kidney:  Normal in size and parenchymal echogenicity.  No evidence of mass or hydronephrosis. Small 8 mm echogenic focus in the mid pole with minimal shadowing posteriorly suspicious for nonobstructing left renal calculus.  Abdominal Aorta:  No aneurysm identified.  IMPRESSION: Mild hepatic steatosis  Nonobstructing left nephrolithiasis  No acute finding by abdominal ultrasound  *RADIOLOGY REPORT*  Clinical Data: Elevated LFTs  DUPLEX ULTRASOUND OF LIVER  Technique:  Color and duplex Doppler ultrasound was performed to evaluate the hepatic in-flow and out-flow vessels.  Comparison:  As above  Portal Vein Velocities: Main:      48 cm/sec Right:      33 cm/sec Left:        29 cm/sec  Hepatic Vein Velocities: Right:     66 cm/sec Middle:  50 cm/sec Left:       49 cm/sec  Hepatic Artery Velocity:   87 cm/sec Splenic Vein Velocity:      35 cm/sec  Varices:   Negative Ascites:   Negative  Findings: Portal vein diameter is 10 mm with normal directional flow.  Hepatic and splenic veins are also patent.  IVC patent. Negative for portal or splenic vein occlusive or thrombus.  No ascites or varices.  IMPRESSION: Normal liver Doppler  Original Report Authenticated By: Judie Petit. Ruel Favors, M.D.   [name] Art/ven Flow Abd Pelv Doppler  05/26/2011  *RADIOLOGY REPORT*  Clinical Data:  Elevated LFTs  ABDOMINAL ULTRASOUND COMPLETE  Comparison:  03/02/2007 CT  Findings:  Gallbladder:  No gallstones,  gallbladder wall thickening, or pericholecystic fluid.  Common Bile Duct:  Within normal limits in caliber.  Liver: Diffuse increased echogenicity suspicious for mild background hepatic steatosis.  No focal hepatic abnormality.  No biliary dilatation.  IVC:  Appears normal.  Pancreas:  No abnormality identified.  Spleen:  Within normal limits in size and echotexture.  Right kidney:  Normal in size and parenchymal echogenicity.  No evidence of mass or hydronephrosis.  Left kidney:  Normal in size and parenchymal echogenicity.  No evidence of mass or hydronephrosis. Small 8 mm echogenic focus in the mid pole with minimal shadowing posteriorly suspicious for nonobstructing left renal calculus.  Abdominal Aorta:  No aneurysm identified.  IMPRESSION: Mild hepatic steatosis  Nonobstructing left nephrolithiasis  No acute finding by abdominal ultrasound  *RADIOLOGY REPORT*  Clinical Data: Elevated LFTs  DUPLEX ULTRASOUND OF LIVER  Technique:  Color and duplex Doppler ultrasound was performed to evaluate the hepatic in-flow and out-flow vessels.  Comparison:  As above  Portal Vein Velocities: Main:      48 cm/sec Right:      33 cm/sec Left:        29 cm/sec  Hepatic Vein Velocities: Right:     66 cm/sec Middle:  50 cm/sec Left:       49 cm/sec  Hepatic Artery Velocity:   87 cm/sec Splenic Vein Velocity:      35 cm/sec  Varices:   Negative Ascites:   Negative  Findings: Portal vein diameter is 10 mm with normal directional flow.  Hepatic and splenic veins are also patent.  IVC patent. Negative for portal or splenic vein occlusive or thrombus.  No ascites or varices.  IMPRESSION: Normal liver Doppler  Original Report Authenticated By: Judie Petit. Ruel Favors, M.D.    Microbiology: No results found for this or any previous visit (from the past 240 hour(s)).   Labs: Basic Metabolic Panel:  Lab 05/27/11 1610 05/26/11 0645 05/25/11 0708 05/25/11 0630 05/24/11 1925  NA 141 139 --  140 137  K 3.8 3.6 -- -- --  CL 105 108 -- 103  101  CO2 28 22 -- 22 21  GLUCOSE 100* 97 -- 100* 99  BUN 6 6 -- 7 9  CREATININE 0.56 0.47* -- 0.53 0.50  CALCIUM 8.9 8.5 -- 9.2 9.4  MG -- -- 1.9 -- --  PHOS -- -- -- -- --   Liver Function Tests:  Lab 05/28/11 0540 05/27/11 0525 05/26/11 0645 05/25/11 0708 05/24/11 1925  AST 93* 214* 719* 2069* 987*  ALT 469* 673* 1099* 1424* 556*  ALKPHOS 86 86 86 101 100  BILITOT 0.3 0.2* 0.3 0.4 0.3  PROT 6.4 6.1 5.8* 6.4 6.6  ALBUMIN 3.6 3.4* 3.3* 4.0 4.3   Lab 05/24/11 2127  AMMONIA 45   CBC:  Lab 05/25/11 0630 05/24/11 1925  WBC 7.7 12.5*  NEUTROABS -- 10.3*  HGB 11.8* 11.8*  HCT 34.3* 33.8*  MCV 84.3 83.9  PLT 238 253   Cardiac Enzymes:  Lab 05/25/11 1205 05/24/11 2149  CKTOTAL 75 --  CKMB -- --  CKMBINDEX -- --  TROPONINI -- <0.30    Time coordinating discharge: 30 minutes.   SignedHartley Barefoot  [facility] 05/28/2011, 1:43 PM

## 2011-06-22 ENCOUNTER — Emergency Department (HOSPITAL_COMMUNITY): Payer: Medicare Other

## 2011-06-22 ENCOUNTER — Inpatient Hospital Stay (HOSPITAL_COMMUNITY)
Admission: EM | Admit: 2011-06-22 | Discharge: 2011-06-25 | DRG: 101 | Disposition: A | Discharge status: Home / Self Care | Payer: Medicare Other | Source: Ambulatory Visit | Attending: Internal Medicine | Admitting: Internal Medicine

## 2011-06-22 ENCOUNTER — Encounter (HOSPITAL_COMMUNITY): Payer: Self-pay | Admitting: Emergency Medicine

## 2011-06-22 DIAGNOSIS — G2581 Restless legs syndrome: Secondary | ICD-10-CM

## 2011-06-22 DIAGNOSIS — R339 Retention of urine, unspecified: Secondary | ICD-10-CM | POA: Diagnosis present

## 2011-06-22 DIAGNOSIS — G40909 Epilepsy, unspecified, not intractable, without status epilepticus: Principal | ICD-10-CM | POA: Diagnosis present

## 2011-06-22 DIAGNOSIS — F341 Dysthymic disorder: Secondary | ICD-10-CM | POA: Diagnosis present

## 2011-06-22 DIAGNOSIS — D649 Anemia, unspecified: Secondary | ICD-10-CM | POA: Diagnosis present

## 2011-06-22 DIAGNOSIS — R569 Unspecified convulsions: Secondary | ICD-10-CM

## 2011-06-22 DIAGNOSIS — E876 Hypokalemia: Secondary | ICD-10-CM | POA: Diagnosis present

## 2011-06-22 HISTORY — DX: Restless legs syndrome: G25.81

## 2011-06-22 LAB — COMPREHENSIVE METABOLIC PANEL
ALT: 43 U/L — ABNORMAL HIGH (ref 0–35)
AST: 37 U/L (ref 0–37)
Albumin: 4.3 g/dL (ref 3.5–5.2)
Alkaline Phosphatase: 99 U/L (ref 39–117)
BUN: 12 mg/dL (ref 6–23)
CO2: 31 mEq/L (ref 19–32)
Calcium: 9.7 mg/dL (ref 8.4–10.5)
Chloride: 98 mEq/L (ref 96–112)
Creatinine, Ser: 0.83 mg/dL (ref 0.50–1.10)
GFR calc Af Amer: >90 mL/min (ref >90)
GFR calc non Af Amer: 84 mL/min — ABNORMAL LOW (ref >90)
Glucose, Bld: 107 mg/dL — ABNORMAL HIGH (ref 70–99)
Potassium: 3.3 mEq/L — ABNORMAL LOW (ref 3.5–5.1)
Sodium: 143 mEq/L (ref 135–145)
Total Bilirubin: 0.2 mg/dL — ABNORMAL LOW (ref 0.3–1.2)
Total Protein: 7.4 g/dL (ref 6.0–8.3)

## 2011-06-22 LAB — URINALYSIS, ROUTINE W REFLEX MICROSCOPIC
Bilirubin Urine: NEGATIVE
Glucose, UA: NEGATIVE mg/dL
Hgb urine dipstick: NEGATIVE
Ketones, ur: NEGATIVE mg/dL
Leukocytes, UA: NEGATIVE
Nitrite: NEGATIVE
Protein, ur: NEGATIVE mg/dL
Specific Gravity, Urine: 1.022 (ref 1.005–1.030)
Urobilinogen, UA: 0.2 mg/dL (ref 0.0–1.0)
pH: 6.5 (ref 5.0–8.0)

## 2011-06-22 LAB — RAPID URINE DRUG SCREEN, HOSP PERFORMED
Amphetamines: NOT DETECTED
Barbiturates: NOT DETECTED
Benzodiazepines: NOT DETECTED
Cocaine: NOT DETECTED
Opiates: NOT DETECTED
Tetrahydrocannabinol: NOT DETECTED

## 2011-06-22 LAB — DIFFERENTIAL
Basophils Absolute: 0 10*3/uL (ref 0.0–0.1)
Basophils Relative: 0 % (ref 0–1)
Eosinophils Absolute: 0 10*3/uL (ref 0.0–0.7)
Eosinophils Relative: 0 % (ref 0–5)
Lymphocytes Relative: 25 % (ref 12–46)
Lymphs Abs: 2.3 10*3/uL (ref 0.7–4.0)
Monocytes Absolute: 0.7 10*3/uL (ref 0.1–1.0)
Monocytes Relative: 8 % (ref 3–12)
Neutro Abs: 6.2 10*3/uL (ref 1.7–7.7)
Neutrophils Relative %: 67 % (ref 43–77)

## 2011-06-22 LAB — PROCALCITONIN: Procalcitonin: <0.1 ng/mL

## 2011-06-22 LAB — ACETAMINOPHEN LEVEL: Acetaminophen (Tylenol), Serum: <15 ug/mL (ref 10–30)

## 2011-06-22 LAB — CBC
HCT: 36.7 % (ref 36.0–46.0)
Hemoglobin: 12.5 g/dL (ref 12.0–15.0)
MCH: 29.3 pg (ref 26.0–34.0)
MCHC: 34.1 g/dL (ref 30.0–36.0)
MCV: 86.2 fL (ref 78.0–100.0)
Platelets: 299 10*3/uL (ref 150–400)
RBC: 4.26 MIL/uL (ref 3.87–5.11)
RDW: 14.1 % (ref 11.5–15.5)
WBC: 9.2 10*3/uL (ref 4.0–10.5)

## 2011-06-22 LAB — PROTIME-INR
INR: 0.99 (ref 0.00–1.49)
Prothrombin Time: 13.3 seconds (ref 11.6–15.2)

## 2011-06-22 LAB — APTT: aPTT: 22 seconds — ABNORMAL LOW (ref 24–37)

## 2011-06-22 LAB — SALICYLATE LEVEL: Salicylate Lvl: <2 mg/dL — ABNORMAL LOW (ref 2.8–20.0)

## 2011-06-22 LAB — LACTIC ACID, PLASMA: Lactic Acid, Venous: 3 mmol/L — ABNORMAL HIGH (ref 0.5–2.2)

## 2011-06-22 LAB — ETHANOL: Alcohol, Ethyl (B): <11 mg/dL (ref 0–11)

## 2011-06-22 MED ORDER — SODIUM CHLORIDE 0.9 % IV SOLN
INTRAVENOUS | Status: DC
  Administered 2011-06-23: 02:00:00 via INTRAVENOUS

## 2011-06-22 MED ORDER — ONDANSETRON HCL 4 MG PO TABS: 4.0000 mg | ORAL_TABLET | Freq: Four times a day (QID) | ORAL | Status: DC | PRN

## 2011-06-22 MED ORDER — LORAZEPAM 2 MG/ML IJ SOLN
INTRAMUSCULAR | Status: AC
  Filled 2011-06-22: qty 1

## 2011-06-22 MED ORDER — SODIUM CHLORIDE 0.9 % IV SOLN
1000.0000 mg | Freq: Once | INTRAVENOUS | Status: DC
  Filled 2011-06-22: qty 20

## 2011-06-22 MED ORDER — SODIUM CHLORIDE 0.9 % IV SOLN
1000.0000 mL | Freq: Once | INTRAVENOUS | Status: AC
  Administered 2011-06-22: 1000 mL via INTRAVENOUS

## 2011-06-22 MED ORDER — PAROXETINE HCL 20 MG PO TABS
40.0000 mg | ORAL_TABLET | Freq: Every day | ORAL | Status: DC
  Administered 2011-06-23 – 2011-06-24 (×2): 40 mg via ORAL
  Filled 2011-06-22 (×3): qty 2

## 2011-06-22 MED ORDER — CLONAZEPAM 0.5 MG PO TABS
0.5000 mg | ORAL_TABLET | Freq: Three times a day (TID) | ORAL | Status: DC | PRN
  Administered 2011-06-24: 0.5 mg via ORAL
  Filled 2011-06-22: qty 1

## 2011-06-22 MED ORDER — ONDANSETRON HCL 4 MG/2ML IJ SOLN: 4.0000 mg | Freq: Four times a day (QID) | INTRAMUSCULAR | Status: DC | PRN

## 2011-06-22 MED ORDER — SODIUM CHLORIDE 0.9 % IJ SOLN
3.0000 mL | Freq: Two times a day (BID) | INTRAMUSCULAR | Status: DC
  Administered 2011-06-24 – 2011-06-25 (×2): 3 mL via INTRAVENOUS

## 2011-06-22 MED ORDER — SODIUM CHLORIDE 0.9 % IV SOLN
1000.0000 mL | INTRAVENOUS | Status: DC
  Administered 2011-06-22: 1000 mL via INTRAVENOUS

## 2011-06-22 MED ORDER — SODIUM CHLORIDE 0.9 % IV SOLN: INTRAVENOUS | Status: DC

## 2011-06-22 NOTE — ED Notes (Addendum)
Husband reports pt had a seizure at 4:15, 5pm, and then 2 on the way to the hospital.  States pt had a Rx filled for Ropinirole 0.5mg  on 6/3 for 90 pills. There is only 11 pills left in the bottle.  He believes that she has been taking too many.  Pt slow to answer questions.  Oriented to person only.

## 2011-06-22 NOTE — ED Notes (Signed)
Patient transported to X-ray 

## 2011-06-22 NOTE — ED Provider Notes (Signed)
History     CSN: [id number]  Arrival date & time 06/22/11  4098   First MD Initiated Contact with Patient 06/22/11 1900      Chief Complaint  Patient presents with  . Seizures  . Drug Overdose    (Consider location/radiation/quality/duration/timing/severity/associated sxs/prior treatment) HPI Comments: The patient is a 35 with a history of seizure disorder and recent admission for her abnormal liver function tests. Her husband says she has been acting funny for a week. Yesterday she had an epileptic seizure. Today she had a seizure for 15, 5 PM, and 2 more seizures on the way to the hospital. With her seizures, she tenses up, and shakes all over and bites her tongue. These episodes each lasted minute or so and resolved. She is on Lamictal and gabapentin and lorazepam for her seizure disorder. She is on paroxetine for depression. The patient is awake, but does not answer questions, so review of systems could not be obtained.  Patient is a 46 y.o. female presenting with seizures and Overdose. The history is provided by the spouse and medical records.  Seizures  This is a new problem. The current episode started 3 to 5 hours ago. Progression since onset: Multiple seizures since 4 PM. There were 4 to 5 seizures. The most recent episode lasted 30 to 120 seconds. Associated symptoms include confusion. Characteristics include loss of consciousness and bit tongue. The episode was witnessed. The seizures did not continue in the ED. The seizure(s) had no focality. There has been no fever. There were no medications administered prior to arrival.  Drug Overdose    Past Medical History  Diagnosis Date  . Epilepsy   . Seizures   . Vertigo   . Constipation   . Restless leg     Past Surgical History  Procedure Date  . Total hip arthroplasty     Left Hip THR X 3  . Abdominal hysterectomy   . Colonoscopy     Family History  Problem Relation Age of Onset  . Cancer Mother   . Cancer Brother     . Diabetes Brother   . Cancer Maternal Aunt   . Diabetes Maternal Aunt     History  Substance Use Topics  . Smoking status: Never Smoker   . Smokeless tobacco: Not on file  . Alcohol Use: No    OB History    Grav Para Term Preterm Abortions TAB SAB Ect Mult Living                  Review of Systems  Unable to perform ROS: Mental status change  Neurological: Positive for seizures and loss of consciousness.  Psychiatric/Behavioral: Positive for confusion.    Allergies  Benadryl; Codeine; Melatonin; Ultram; Vicodin; Latex; and Penicillins  Home Medications   Current Outpatient Rx  Name Route Sig Dispense Refill  . CLONAZEPAM 0.5 MG PO TABS Oral Take 0.5 mg by mouth 3 (three) times daily as needed. For anxiety    . GABAPENTIN 600 MG PO TABS Oral Take 600 mg by mouth 2 (two) times daily.    . IBUPROFEN 800 MG PO TABS Oral Take 800 mg by mouth every 8 (eight) hours as needed. For pain    . LAMOTRIGINE 200 MG PO TABS Oral Take 200 mg by mouth 2 (two) times daily.      Marland Kitchen PAROXETINE HCL 20 MG PO TABS Oral Take 40 mg by mouth at bedtime.    Marland Kitchen ROPINIROLE HCL 0.25 MG PO  TABS Oral Take 0.5 mg by mouth at bedtime.    . TROSPIUM CHLORIDE 20 MG PO TABS Oral Take 20 mg by mouth 2 (two) times daily.      BP 126/83  Pulse 111  Temp(Src) 98.9 F (37.2 C) (Oral)  Resp 18  SpO2 95%  Physical Exam  Nursing note and vitals reviewed. Constitutional:       Pt is awake but does not answer questions.  She has a resting tachycardia of 110.    HENT:  Head: Normocephalic and atraumatic.  Right Ear: External ear normal.  Left Ear: External ear normal.  Mouth/Throat: Oropharynx is clear and moist.  Eyes: Conjunctivae and EOM are normal. Pupils are equal, round, and reactive to light.  Neck: Normal range of motion. Neck supple.  Cardiovascular: Normal rate, regular rhythm and normal heart sounds.   Pulmonary/Chest: Effort normal and breath sounds normal.  Abdominal: Soft. Bowel sounds are  normal.  Musculoskeletal: Normal range of motion. She exhibits no edema and no tenderness.  Neurological:       Awake but does not answer questions.  Sensory and motor grossly intact.   Skin: Skin is warm and dry.  Psychiatric:       Unable to assess.    ED Course  Procedures (including critical care time)   Labs Reviewed  CBC  DIFFERENTIAL  COMPREHENSIVE METABOLIC PANEL  URINE RAPID DRUG SCREEN (HOSP PERFORMED)  ETHANOL  LACTIC ACID, PLASMA  PROCALCITONIN  PROTIME-INR  APTT  URINALYSIS, ROUTINE W REFLEX MICROSCOPIC  URINE CULTURE  ACETAMINOPHEN LEVEL  SALICYLATE LEVEL   7:17 PM Patient was seen and had physical exam. She is awake but does not answer questions. Her husband says she's had several seizures today this may reflect a postictal state, or perhaps overdose with medication. Laboratory tests EKG, chest x-ray, CT scan of the brain were ordered.  7:52 PM  Date: 06/22/2011  Rate: 95  Rhythm: normal sinus rhythm  QRS Axis: normal  Intervals: QT prolonged  ST/T Wave abnormalities: nonspecific T wave changes  Conduction Disutrbances:none  Narrative Interpretation: Abnormal EKG  Old EKG Reviewed: none available  10:00 PM Results for orders placed during the hospital encounter of 06/22/11  CBC      Component Value Range   WBC 9.2  4.0 - 10.5 (K/uL)   RBC 4.26  3.87 - 5.11 (MIL/uL)   Hemoglobin 12.5  12.0 - 15.0 (g/dL)   HCT 09.8  11.9 - 14.7 (%)   MCV 86.2  78.0 - 100.0 (fL)   MCH 29.3  26.0 - 34.0 (pg)   MCHC 34.1  30.0 - 36.0 (g/dL)   RDW 82.9  56.2 - 13.0 (%)   Platelets 299  150 - 400 (K/uL)  DIFFERENTIAL      Component Value Range   Neutrophils Relative 67  43 - 77 (%)   Neutro Abs 6.2  1.7 - 7.7 (K/uL)   Lymphocytes Relative 25  12 - 46 (%)   Lymphs Abs 2.3  0.7 - 4.0 (K/uL)   Monocytes Relative 8  3 - 12 (%)   Monocytes Absolute 0.7  0.1 - 1.0 (K/uL)   Eosinophils Relative 0  0 - 5 (%)   Eosinophils Absolute 0.0  0.0 - 0.7 (K/uL)   Basophils  Relative 0  0 - 1 (%)   Basophils Absolute 0.0  0.0 - 0.1 (K/uL)  COMPREHENSIVE METABOLIC PANEL      Component Value Range   Sodium 143  135 - 145 (  mEq/L)   Potassium 3.3 (*) 3.5 - 5.1 (mEq/L)   Chloride 98  96 - 112 (mEq/L)   CO2 31  19 - 32 (mEq/L)   Glucose, Bld 107 (*) 70 - 99 (mg/dL)   BUN 12  6 - 23 (mg/dL)   Creatinine, Ser 1.61  0.50 - 1.10 (mg/dL)   Calcium 9.7  8.4 - 09.6 (mg/dL)   Total Protein 7.4  6.0 - 8.3 (g/dL)   Albumin 4.3  3.5 - 5.2 (g/dL)   AST 37  0 - 37 (U/L)   ALT 43 (*) 0 - 35 (U/L)   Alkaline Phosphatase 99  39 - 117 (U/L)   Total Bilirubin 0.2 (*) 0.3 - 1.2 (mg/dL)   GFR calc non Af Amer 84 (*) >90 (mL/min)   GFR calc Af Amer >90  >90 (mL/min)  URINE RAPID DRUG SCREEN (HOSP PERFORMED)      Component Value Range   Opiates NONE DETECTED  NONE DETECTED    Cocaine NONE DETECTED  NONE DETECTED    Benzodiazepines NONE DETECTED  NONE DETECTED    Amphetamines NONE DETECTED  NONE DETECTED    Tetrahydrocannabinol NONE DETECTED  NONE DETECTED    Barbiturates NONE DETECTED  NONE DETECTED   ETHANOL      Component Value Range   Alcohol, Ethyl (B) <11  0 - 11 (mg/dL)  LACTIC ACID, PLASMA      Component Value Range   Lactic Acid, Venous 3.0 (*) 0.5 - 2.2 (mmol/L)  PROTIME-INR      Component Value Range   Prothrombin Time 13.3  11.6 - 15.2 (seconds)   INR 0.99  0.00 - 1.49   APTT      Component Value Range   aPTT 22 (*) 24 - 37 (seconds)  URINALYSIS, ROUTINE W REFLEX MICROSCOPIC      Component Value Range   Color, Urine YELLOW  YELLOW    APPearance HAZY (*) CLEAR    Specific Gravity, Urine 1.022  1.005 - 1.030    pH 6.5  5.0 - 8.0    Glucose, UA NEGATIVE  NEGATIVE (mg/dL)   Hgb urine dipstick NEGATIVE  NEGATIVE    Bilirubin Urine NEGATIVE  NEGATIVE    Ketones, ur NEGATIVE  NEGATIVE (mg/dL)   Protein, ur NEGATIVE  NEGATIVE (mg/dL)   Urobilinogen, UA 0.2  0.0 - 1.0 (mg/dL)   Nitrite NEGATIVE  NEGATIVE    Leukocytes, UA NEGATIVE  NEGATIVE   ACETAMINOPHEN  LEVEL      Component Value Range   Acetaminophen (Tylenol), Serum <15.0  10 - 30 (ug/mL)  SALICYLATE LEVEL      Component Value Range   Salicylate Lvl <2.0 (*) 2.8 - 20.0 (mg/dL)   Dg Chest 2 View  0/04/5407  *RADIOLOGY REPORT*  Clinical Data: Seizure  CHEST - 2 VIEW  Comparison: 01/02/2011  Findings: Hypoaeration results in interstitial and vascular crowding.  Elevated hemidiaphragms obscures the lung bases with suggestion of lung base atelectasis.  Within these limitations, no focal consolidation, pleural effusion, pneumothorax. Cardiomediastinal contours within normal limits.  No acute osseous finding.  IMPRESSION: Degraded by hypoaeration.  No definite radiographic evidence of acute cardiopulmonary process.  Original Report Authenticated By: Waneta Martins, M.D.    Original Report Authenticated By: Bernadene Bell. Maricela Curet, M.D.   Ct Head Wo Contrast  06/22/2011  *RADIOLOGY REPORT*  Clinical Data: Seizure/drug overdose.  CT HEAD WITHOUT CONTRAST  Technique:  Contiguous axial images were obtained from the base of the skull through the  vertex without contrast.  Comparison: 04/23/2011  Findings: Bone windows demonstrate clear paranasal sinuses and mastoid air cells.  Soft tissue windows demonstrate no  mass lesion, hemorrhage, hydrocephalus, acute infarct, intra-axial, or extra-axial fluid collection.  IMPRESSION: Normal head CT.  Original Report Authenticated By: Consuello Bossier, M.D.   10:02 PM  Lab tests did not show a cause of seizure.  She has had 2 more seizures while in ED.  Will give IV dilantin and will request she be admitted for recurrent seizures.  10:37 PM After discussion with Dr. Toniann Fail, IV Dilantin D/Cd because it apparently elevated her LFT's.  Call to Dr. Lesia Sago, neurologist, to discuss what antiepileptic medicine to give to pt.  1. Seizure disorder        Carleene Cooper III, MD 06/24/11 1248

## 2011-06-22 NOTE — H&P (Signed)
[name] is an 46 y.o. female.   PCP - Dr.Donald Christell Constant. Chief Complaint: Seizures. HPI: 46 year old female with known history of seizures was brought to the ER because of seizures by her husband. As per patient's husband patient has had at least 4 seizures since 4:30 PM this evening. She also had a seizure last evening. Each seizure episode lasted for less than a minute and was generalized tonic-clonic followed by post ictal state which lasted for a few minutes, where the patient gets confused and sometimes unconscious. She also had 2 seizures in the ER lasting less than a minute each. It was generalized tonic-clonic as described by the ER physician Dr. Ignacia Palma. Patient was just recently discharged from the hospital 3 weeks ago after being treated for seizures and abnormal LFTs. Her AST ALT levels at that time had gone up to 2000 and was thought to be secondary to Dilantin and Dilantin was discontinued. At that time patient also was on Vimpat which also was discontinued due to increased LFTs. Patient was discharged on Neurontin and Lamictal. Patient's Lamictal was discontinued by her neurologist last week [name]. In addition patient's Ropirinole, which patient took for restless leg syndrome, also was discontinued by her neurologist. At this time I have discussed with neurologist Dr. Anne Hahn who will be seeing patient in consult. Patient's husband states patient has not missed her medications. In addition patient's husband states of recently last few days patient has been having increasing hallucinations. There was also concern if patient took more than required Ropirinole.      Past Medical History  Diagnosis Date  . Epilepsy   . Seizures   . Vertigo   . Constipation   . Restless leg     Past Surgical History  Procedure Date  . Total hip arthroplasty     Left Hip THR X 3  . Abdominal hysterectomy   . Colonoscopy     Family History  Problem Relation Age of Onset  . Cancer Mother   .  Cancer Brother   . Diabetes Brother   . Cancer Maternal Aunt   . Diabetes Maternal Aunt    Social History:  reports that she has never smoked. She does not have any smokeless tobacco history on file. She reports that she does not drink alcohol or use illicit drugs.  Allergies:  Allergies  Allergen Reactions  . Benadryl (Diphenhydramine Hcl) Other (See Comments)    seizures  . Codeine     rash  . Melatonin Other (See Comments)    Not sure  . Ultram (Tramadol Hcl) Other (See Comments)    Reaction:Seizures  . Vicodin (Hydrocodone-Acetaminophen) Itching  . Latex Rash  . Penicillins Itching and Rash     (Not in a hospital admission)  Results for orders placed during the hospital encounter of 06/22/11 (from the past 48 hour(s))  CBC     Status: Normal   Collection Time   06/22/11  7:50 PM      Component Value Range Comment   WBC 9.2  4.0 - 10.5 (K/uL)    RBC 4.26  3.87 - 5.11 (MIL/uL)    Hemoglobin 12.5  12.0 - 15.0 (g/dL)    HCT 40.9  81.1 - 91.4 (%)    MCV 86.2  78.0 - 100.0 (fL)    MCH 29.3  26.0 - 34.0 (pg)    MCHC 34.1  30.0 - 36.0 (g/dL)    RDW 78.2  95.6 - 21.3 (%)    Platelets  299  150 - 400 (K/uL)   DIFFERENTIAL     Status: Normal   Collection Time   06/22/11  7:50 PM      Component Value Range Comment   Neutrophils Relative 67  43 - 77 (%)    Neutro Abs 6.2  1.7 - 7.7 (K/uL)    Lymphocytes Relative 25  12 - 46 (%)    Lymphs Abs 2.3  0.7 - 4.0 (K/uL)    Monocytes Relative 8  3 - 12 (%)    Monocytes Absolute 0.7  0.1 - 1.0 (K/uL)    Eosinophils Relative 0  0 - 5 (%)    Eosinophils Absolute 0.0  0.0 - 0.7 (K/uL)    Basophils Relative 0  0 - 1 (%)    Basophils Absolute 0.0  0.0 - 0.1 (K/uL)   COMPREHENSIVE METABOLIC PANEL     Status: Abnormal   Collection Time   06/22/11  7:50 PM      Component Value Range Comment   Sodium 143  135 - 145 (mEq/L)    Potassium 3.3 (*) 3.5 - 5.1 (mEq/L)    Chloride 98  96 - 112 (mEq/L)    CO2 31  19 - 32 (mEq/L)    Glucose, Bld  107 (*) 70 - 99 (mg/dL)    BUN 12  6 - 23 (mg/dL)    Creatinine, Ser 1.61  0.50 - 1.10 (mg/dL)    Calcium 9.7  8.4 - 10.5 (mg/dL)    Total Protein 7.4  6.0 - 8.3 (g/dL)    Albumin 4.3  3.5 - 5.2 (g/dL)    AST 37  0 - 37 (U/L)    ALT 43 (*) 0 - 35 (U/L)    Alkaline Phosphatase 99  39 - 117 (U/L)    Total Bilirubin 0.2 (*) 0.3 - 1.2 (mg/dL)    GFR calc non Af Amer 84 (*) >90 (mL/min)    GFR calc Af Amer >90  >90 (mL/min)   ETHANOL     Status: Normal   Collection Time   06/22/11  7:50 PM      Component Value Range Comment   Alcohol, Ethyl (B) <11  0 - 11 (mg/dL)   PROCALCITONIN     Status: Normal   Collection Time   06/22/11  7:50 PM      Component Value Range Comment   Procalcitonin <0.10     PROTIME-INR     Status: Normal   Collection Time   06/22/11  7:50 PM      Component Value Range Comment   Prothrombin Time 13.3  11.6 - 15.2 (seconds)    INR 0.99  0.00 - 1.49    APTT     Status: Abnormal   Collection Time   06/22/11  7:50 PM      Component Value Range Comment   aPTT 22 (*) 24 - 37 (seconds)   ACETAMINOPHEN LEVEL     Status: Normal   Collection Time   06/22/11  7:50 PM      Component Value Range Comment   Acetaminophen (Tylenol), Serum <15.0  10 - 30 (ug/mL)   SALICYLATE LEVEL     Status: Abnormal   Collection Time   06/22/11  7:50 PM      Component Value Range Comment   Salicylate Lvl <2.0 (*) 2.8 - 20.0 (mg/dL)   LACTIC ACID, PLASMA     Status: Abnormal   Collection Time   06/22/11  7:54 PM  Component Value Range Comment   Lactic Acid, Venous 3.0 (*) 0.5 - 2.2 (mmol/L)   URINE RAPID DRUG SCREEN (HOSP PERFORMED)     Status: Normal   Collection Time   06/22/11  8:53 PM      Component Value Range Comment   Opiates NONE DETECTED  NONE DETECTED     Cocaine NONE DETECTED  NONE DETECTED     Benzodiazepines NONE DETECTED  NONE DETECTED     Amphetamines NONE DETECTED  NONE DETECTED     Tetrahydrocannabinol NONE DETECTED  NONE DETECTED     Barbiturates NONE DETECTED  NONE  DETECTED    URINALYSIS, ROUTINE W REFLEX MICROSCOPIC     Status: Abnormal   Collection Time   06/22/11  8:53 PM      Component Value Range Comment   Color, Urine YELLOW  YELLOW     APPearance HAZY (*) CLEAR     Specific Gravity, Urine 1.022  1.005 - 1.030     pH 6.5  5.0 - 8.0     Glucose, UA NEGATIVE  NEGATIVE (mg/dL)    Hgb urine dipstick NEGATIVE  NEGATIVE     Bilirubin Urine NEGATIVE  NEGATIVE     Ketones, ur NEGATIVE  NEGATIVE (mg/dL)    Protein, ur NEGATIVE  NEGATIVE (mg/dL)    Urobilinogen, UA 0.2  0.0 - 1.0 (mg/dL)    Nitrite NEGATIVE  NEGATIVE     Leukocytes, UA NEGATIVE  NEGATIVE  MICROSCOPIC NOT DONE ON URINES WITH NEGATIVE PROTEIN, BLOOD, LEUKOCYTES, NITRITE, OR GLUCOSE <1000 mg/dL.   Dg Chest 2 View  06/22/2011  *RADIOLOGY REPORT*  Clinical Data: Seizure  CHEST - 2 VIEW  Comparison: 01/02/2011  Findings: Hypoaeration results in interstitial and vascular crowding.  Elevated hemidiaphragms obscures the lung bases with suggestion of lung base atelectasis.  Within these limitations, no focal consolidation, pleural effusion, pneumothorax. Cardiomediastinal contours within normal limits.  No acute osseous finding.  IMPRESSION: Degraded by hypoaeration.  No definite radiographic evidence of acute cardiopulmonary process.  Original Report Authenticated By: Waneta Martins, M.D.   Ct Head Wo Contrast  06/22/2011  *RADIOLOGY REPORT*  Clinical Data: Seizure/drug overdose.  CT HEAD WITHOUT CONTRAST  Technique:  Contiguous axial images were obtained from the base of the skull through the vertex without contrast.  Comparison: 04/23/2011  Findings: Bone windows demonstrate clear paranasal sinuses and mastoid air cells.  Soft tissue windows demonstrate no  mass lesion, hemorrhage, hydrocephalus, acute infarct, intra-axial, or extra-axial fluid collection.  IMPRESSION: Normal head CT.  Original Report Authenticated By: Consuello Bossier, M.D.    Review of Systems  Constitutional: Negative.   HENT:  Negative.   Eyes: Negative.   Respiratory: Negative.   Cardiovascular: Negative.   Gastrointestinal: Negative.   Genitourinary: Negative.   Musculoskeletal: Negative.   Skin: Negative.   Neurological: Positive for seizures.  Endo/Heme/Allergies: Negative.   Psychiatric/Behavioral: Negative.     Blood pressure 123/74, pulse 99, temperature 98.3 F (36.8 C), temperature source Rectal, resp. rate 27, SpO2 98.00%. Physical Exam  Constitutional: She is oriented to person, place, and time. She appears well-developed and well-nourished. No distress.  HENT:  Head: Normocephalic and atraumatic.  Right Ear: External ear normal.  Left Ear: External ear normal.  Nose: Nose normal.  Mouth/Throat: Oropharynx is clear and moist. No oropharyngeal exudate.  Eyes: Conjunctivae are normal. Pupils are equal, round, and reactive to light. Right eye exhibits no discharge. Left eye exhibits no discharge. No scleral icterus.  Neck: Normal  range of motion. Neck supple.  Cardiovascular: Normal rate and regular rhythm.   Respiratory: Effort normal and breath sounds normal. No respiratory distress. She has no wheezes. She has no rales.  GI: Soft. Bowel sounds are normal. She exhibits no distension. There is no tenderness. There is no rebound.  Musculoskeletal: Normal range of motion. She exhibits no edema and no tenderness.  Neurological: She is alert and oriented to person, place, and time.       Moves all extremities.  Skin: Skin is warm and dry. No rash noted. She is not diaphoretic. No erythema.  Psychiatric: Her behavior is normal.     Assessment/Plan #1. Seizures - I have placed patient on when necessary Ativan for any seizures. Further seizure medications recommendations per neurologist Dr. Anne Hahn. #2. Recently admitted for seizures and was found to have increased LFTs thought to be secondary to Dilantin and Dilantin was discontinued. #3. History of restless leg syndrome.  CODE STATUS - full code.    Eduard Clos. 06/22/2011, 11:27 PM

## 2011-06-23 ENCOUNTER — Inpatient Hospital Stay (HOSPITAL_COMMUNITY): Payer: Medicare Other

## 2011-06-23 DIAGNOSIS — G2581 Restless legs syndrome: Secondary | ICD-10-CM

## 2011-06-23 DIAGNOSIS — E876 Hypokalemia: Secondary | ICD-10-CM | POA: Diagnosis present

## 2011-06-23 DIAGNOSIS — G40309 Generalized idiopathic epilepsy and epileptic syndromes, not intractable, without status epilepticus: Secondary | ICD-10-CM

## 2011-06-23 DIAGNOSIS — R569 Unspecified convulsions: Secondary | ICD-10-CM

## 2011-06-23 DIAGNOSIS — D649 Anemia, unspecified: Secondary | ICD-10-CM | POA: Diagnosis present

## 2011-06-23 LAB — COMPREHENSIVE METABOLIC PANEL
ALT: 31 U/L (ref 0–35)
AST: 25 U/L (ref 0–37)
Albumin: 3.3 g/dL — ABNORMAL LOW (ref 3.5–5.2)
Alkaline Phosphatase: 80 U/L (ref 39–117)
BUN: 11 mg/dL (ref 6–23)
CO2: 28 mEq/L (ref 19–32)
Calcium: 8.4 mg/dL (ref 8.4–10.5)
Chloride: 109 mEq/L (ref 96–112)
Creatinine, Ser: 0.6 mg/dL (ref 0.50–1.10)
GFR calc Af Amer: >90 mL/min (ref >90)
GFR calc non Af Amer: >90 mL/min (ref >90)
Glucose, Bld: 87 mg/dL (ref 70–99)
Potassium: 3 mEq/L — ABNORMAL LOW (ref 3.5–5.1)
Sodium: 146 mEq/L — ABNORMAL HIGH (ref 135–145)
Total Bilirubin: 0.2 mg/dL — ABNORMAL LOW (ref 0.3–1.2)
Total Protein: 5.7 g/dL — ABNORMAL LOW (ref 6.0–8.3)

## 2011-06-23 LAB — CBC
HCT: 32.5 % — ABNORMAL LOW (ref 36.0–46.0)
Hemoglobin: 10.7 g/dL — ABNORMAL LOW (ref 12.0–15.0)
MCH: 29.1 pg (ref 26.0–34.0)
MCHC: 32.9 g/dL (ref 30.0–36.0)
MCV: 88.3 fL (ref 78.0–100.0)
Platelets: 256 10*3/uL (ref 150–400)
RBC: 3.68 MIL/uL — ABNORMAL LOW (ref 3.87–5.11)
RDW: 14.4 % (ref 11.5–15.5)
WBC: 7.9 10*3/uL (ref 4.0–10.5)

## 2011-06-23 LAB — LACTIC ACID, PLASMA: Lactic Acid, Venous: 2.7 mmol/L — ABNORMAL HIGH (ref 0.5–2.2)

## 2011-06-23 LAB — DIFFERENTIAL
Basophils Absolute: 0 10*3/uL (ref 0.0–0.1)
Basophils Relative: 0 % (ref 0–1)
Eosinophils Absolute: 0.1 10*3/uL (ref 0.0–0.7)
Eosinophils Relative: 1 % (ref 0–5)
Lymphocytes Relative: 35 % (ref 12–46)
Lymphs Abs: 2.8 10*3/uL (ref 0.7–4.0)
Monocytes Absolute: 0.8 10*3/uL (ref 0.1–1.0)
Monocytes Relative: 10 % (ref 3–12)
Neutro Abs: 4.3 10*3/uL (ref 1.7–7.7)
Neutrophils Relative %: 54 % (ref 43–77)

## 2011-06-23 LAB — MAGNESIUM: Magnesium: 2.3 mg/dL (ref 1.5–2.5)

## 2011-06-23 LAB — POCT PREGNANCY, URINE: Preg Test, Ur: NEGATIVE

## 2011-06-23 MED ORDER — GABAPENTIN 300 MG PO CAPS
300.0000 mg | ORAL_CAPSULE | Freq: Every day | ORAL | Status: DC
  Administered 2011-06-23 – 2011-06-24 (×3): 300 mg via ORAL
  Filled 2011-06-23 (×4): qty 1

## 2011-06-23 MED ORDER — IBUPROFEN 800 MG PO TABS
800.0000 mg | ORAL_TABLET | Freq: Three times a day (TID) | ORAL | Status: DC | PRN
  Administered 2011-06-23 – 2011-06-25 (×3): 800 mg via ORAL
  Filled 2011-06-23 (×3): qty 1

## 2011-06-23 MED ORDER — LORAZEPAM 2 MG/ML IJ SOLN
1.0000 mg | INTRAMUSCULAR | Status: DC | PRN
  Administered 2011-06-23: 1 mg via INTRAVENOUS
  Filled 2011-06-23: qty 1

## 2011-06-23 MED ORDER — POTASSIUM CHLORIDE CRYS ER 20 MEQ PO TBCR
40.0000 meq | EXTENDED_RELEASE_TABLET | ORAL | Status: AC
  Administered 2011-06-23 (×2): 40 meq via ORAL
  Filled 2011-06-23 (×2): qty 2

## 2011-06-23 MED ORDER — SODIUM CHLORIDE 0.9 % IV SOLN
100.0000 mg | Freq: Two times a day (BID) | INTRAVENOUS | Status: DC
  Administered 2011-06-23 – 2011-06-24 (×4): 100 mg via INTRAVENOUS
  Filled 2011-06-23 (×8): qty 10

## 2011-06-23 MED ORDER — SODIUM CHLORIDE 0.45 % IV SOLN
INTRAVENOUS | Status: DC
  Administered 2011-06-23 (×2): via INTRAVENOUS

## 2011-06-23 MED ORDER — DARIFENACIN HYDROBROMIDE ER 7.5 MG PO TB24
7.5000 mg | ORAL_TABLET | Freq: Every day | ORAL | Status: DC
  Administered 2011-06-23 – 2011-06-24 (×2): 7.5 mg via ORAL
  Filled 2011-06-23 (×2): qty 1

## 2011-06-23 NOTE — Progress Notes (Signed)
Subjective: Per nursing patient with seizure episode around midnight. Patient awake with no complaints this morning.  Objective: Vital signs in last 24 hours: Filed Vitals:   06/22/11 2200 06/22/11 2300 06/23/11 0015 06/23/11 0500  BP: 123/74 125/82 153/85 97/60  Pulse: 99 91 88 81  Temp:  98.1 F (36.7 C)  98 F (36.7 C)  TempSrc:      Resp: 27 20  18   Height:  5\' 1"  (1.549 m)    Weight:  78.3 kg (172 lb 9.9 oz)    SpO2: 98% 97% 99% 100%   No intake or output data in the 24 hours ending 06/23/11 0854  Weight change:   General: Alert, awake, oriented x3, in no acute distress. HEENT: No bruits, no goiter. Heart: Regular rate and rhythm, without murmurs, rubs, gallops. Lungs: Clear to auscultation bilaterally. Abdomen: Soft, nontender, nondistended, positive bowel sounds. Extremities: No clubbing cyanosis or edema with positive pedal pulses. Neuro: Grossly intact, nonfocal.    Lab Results:  [facility] 06/23/11 0542 06/22/11 1950  NA 146* 143  K 3.0* 3.3*  CL 109 98  CO2 28 31  GLUCOSE 87 107*  BUN 11 12  CREATININE 0.60 0.83  CALCIUM 8.4 9.7  MG -- --  PHOS -- --    Basename 06/23/11 0542 06/22/11 1950  AST 25 37  ALT 31 43*  ALKPHOS 80 99  BILITOT 0.2* 0.2*  PROT 5.7* 7.4  ALBUMIN 3.3* 4.3   No results found for this basename: LIPASE:2,AMYLASE:2 in the last 72 hours  Basename 06/23/11 0542 06/22/11 1950  WBC 7.9 9.2  NEUTROABS 4.3 6.2  HGB 10.7* 12.5  HCT 32.5* 36.7  MCV 88.3 86.2  PLT 256 299   No results found for this basename: CKTOTAL:3,CKMB:3,CKMBINDEX:3,TROPONINI:3 in the last 72 hours No components found with this basename: POCBNP:3 No results found for this basename: DDIMER:2 in the last 72 hours No results found for this basename: HGBA1C:2 in the last 72 hours No results found for this basename: CHOL:2,HDL:2,LDLCALC:2,TRIG:2,CHOLHDL:2,LDLDIRECT:2 in the last 72 hours No results found for this basename: TSH,T4TOTAL,FREET3,T3FREE,THYROIDAB in  the last 72 hours No results found for this basename: VITAMINB12:2,FOLATE:2,FERRITIN:2,TIBC:2,IRON:2,RETICCTPCT:2 in the last 72 hours  Micro Results: No results found for this or any previous visit (from the past 240 hour(s)).  Studies/Results: Dg Chest 2 View  06/22/2011  *RADIOLOGY REPORT*  Clinical Data: Seizure  CHEST - 2 VIEW  Comparison: 01/02/2011  Findings: Hypoaeration results in interstitial and vascular crowding.  Elevated hemidiaphragms obscures the lung bases with suggestion of lung base atelectasis.  Within these limitations, no focal consolidation, pleural effusion, pneumothorax. Cardiomediastinal contours within normal limits.  No acute osseous finding.  IMPRESSION: Degraded by hypoaeration.  No definite radiographic evidence of acute cardiopulmonary process.  Original Report Authenticated By: Waneta Martins, M.D.   Ct Head Wo Contrast  06/22/2011  *RADIOLOGY REPORT*  Clinical Data: Seizure/drug overdose.  CT HEAD WITHOUT CONTRAST  Technique:  Contiguous axial images were obtained from the base of the skull through the vertex without contrast.  Comparison: 04/23/2011  Findings: Bone windows demonstrate clear paranasal sinuses and mastoid air cells.  Soft tissue windows demonstrate no  mass lesion, hemorrhage, hydrocephalus, acute infarct, intra-axial, or extra-axial fluid collection.  IMPRESSION: Normal head CT.  Original Report Authenticated By: Consuello Bossier, M.D.    Medications:     . sodium chloride  1,000 mL Intravenous Once  . darifenacin  7.5 mg Oral Daily  . gabapentin  300 mg Oral QHS  .  lacosamide (VIMPAT) IV  100 mg Intravenous Q12H  . PARoxetine  40 mg Oral QHS  . potassium chloride  40 mEq Oral Q4H  . sodium chloride  3 mL Intravenous Q12H  . DISCONTD: phenytoin (DILANTIN) IV  1,000 mg Intravenous Once    Assessment: Active Problems:  Seizures  Hypokalemia  Anemia   Plan: #1 Seizures ??etiology. Patient with hx of seizures. EEG pending. Patient  started on vimpat per neurology. Replete electrolytes. Ativan prn. Per Neurology.  #2 Restless leg syndrome Monitor for now. ??resume requip will defer to neuro  #3. Hypokalemia Check magnesium level. Replete.  #4. Anxiety Continue paxil and klonopin.  #5. Prophylaxis SCDs for DVT.  LOS: 1 day   Yoshiko Keleher [phone] 06/23/2011, 8:54 AM

## 2011-06-23 NOTE — Care Management Note (Addendum)
    Page 1 of 1   06/25/2011     11:04:22 AM   CARE MANAGEMENT NOTE 06/25/2011  Patient:  [name], [name]   Account Number:  [id number]  Date Initiated:  06/23/2011  Documentation initiated by:  GRAVES-BIGELOW,Yolani Vo  Subjective/Objective Assessment:   Pt admitted with Seizures.  Pt is from home with husband. Pt's mom  usually helps gets her medications ready for the week. Pt is on disability. CM spoke to husband d/t referral for medication assist.     Action/Plan:   Husband states most of the income goes toward meds. Pt gets meds from [facility]. CM did mention to look at [facility] for cheaper price and to contact co that makes meds and they may can help if family is in hardship. Pt/ Husband refused HH RN.   Anticipated DC Date:  06/26/2011   Anticipated DC Plan:  HOME/SELF CARE      DC Planning Services  CM consult      Choice offered to / List presented to:             Status of service:  Completed, signed off Medicare Important Message given?   (If response is "NO", the following Medicare IM given date fields will be blank) Date Medicare IM given:   Date Additional Medicare IM given:    Discharge Disposition:  HOME/SELF CARE  Per UR Regulation:  Reviewed for med. necessity/level of care/duration of stay  If discussed at Long Length of Stay Meetings, dates discussed:    Comments:  06-25-11 1102 Tomi Bamberger, RN,BSN Pt plan for d/c home with foley cath. Pt states she will be able to manage foley at home and will not need [facility] RN for services. No further needs from CM at this time.  06-24-11 [address], [place] [phone] CM will continue to monitor  for additional dispostion needs.

## 2011-06-23 NOTE — Consult Note (Signed)
Reason for Consult:Seizures Referring Physician: Dr. Trey Paula is an 46 y.o. female.  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 The patient has apparently been in the hospital recently, and she had been noted to have increased liver enzymes on Dilantin. The patient was treated with gabapentin and Lamictal, but the Lamictal apparently was recently discontinued. The patient has remained on gabapentin. The patient comes in with at least 5 seizures on the day of admission, with generalized jerking. The patient has not had tongue biting. In the past, EEG studies have been normal. Neurology is consult for evaluation of the seizures. CT the head was unremarkable. The patient denies any focal numbness or weakness of the face, arms, or legs.   Past Medical History  Diagnosis Date  . Epilepsy   . Seizures   . Vertigo   . Constipation   . Restless leg     Past Surgical History  Procedure Date  . Total hip arthroplasty     Left Hip THR X 3  . Abdominal hysterectomy   . Colonoscopy     Family History  Problem Relation Age of Onset  . Cancer Mother   . Cancer Brother   . Diabetes Brother   . Cancer Maternal Aunt   . Diabetes Maternal Aunt     Social History:  reports that she has never smoked. She has never used smokeless tobacco. She reports that she does not drink alcohol or use illicit drugs.  Allergies:  Allergies  Allergen Reactions  . Benadryl (Diphenhydramine Hcl) Other (See Comments)    seizures  . Codeine     rash  . Dilantin (Phenytoin Sodium Extended)     Elevated LFT's  . Melatonin Other (See Comments)    Not sure  . Ultram (Tramadol Hcl) Other (See Comments)    Reaction:Seizures  . Vicodin (Hydrocodone-Acetaminophen) Itching  . Latex Rash  . Penicillins Itching and Rash    Medications:  Scheduled:   . sodium chloride  1,000 mL Intravenous Once  . PARoxetine  40 mg Oral  QHS  . sodium chloride  3 mL Intravenous Q12H  . DISCONTD: phenytoin (DILANTIN) IV  1,000 mg Intravenous Once   Continuous:   . sodium chloride 100 mL/hr at 06/23/11 0145  . DISCONTD: sodium chloride 1,000 mL (06/22/11 2047)  . DISCONTD: sodium chloride     ZOX:WRUEAVWUJW, LORazepam, ondansetron (ZOFRAN) IV, ondansetron  Results for orders placed during the hospital encounter of 06/22/11 (from the past 48 hour(s))  CBC     Status: Normal   Collection Time   06/22/11  7:50 PM      Component Value Range Comment   WBC 9.2  4.0 - 10.5 (K/uL)    RBC 4.26  3.87 - 5.11 (MIL/uL)    Hemoglobin 12.5  12.0 - 15.0 (g/dL)    HCT 11.9  14.7 - 82.9 (%)    MCV 86.2  78.0 - 100.0 (fL)    MCH 29.3  26.0 - 34.0 (pg)    MCHC 34.1  30.0 - 36.0 (g/dL)    RDW 56.2  13.0 - 86.5 (%)    Platelets 299  150 - 400 (K/uL)   DIFFERENTIAL     Status: Normal   Collection Time   06/22/11  7:50 PM      Component Value Range Comment   Neutrophils Relative 67  43 - 77 (%)    Neutro Abs 6.2  1.7 - 7.7 (K/uL)    Lymphocytes Relative 25  12 - 46 (%)    Lymphs Abs 2.3  0.7 - 4.0 (K/uL)    Monocytes Relative 8  3 - 12 (%)    Monocytes Absolute 0.7  0.1 - 1.0 (K/uL)    Eosinophils Relative 0  0 - 5 (%)    Eosinophils Absolute 0.0  0.0 - 0.7 (K/uL)    Basophils Relative 0  0 - 1 (%)    Basophils Absolute 0.0  0.0 - 0.1 (K/uL)   COMPREHENSIVE METABOLIC PANEL     Status: Abnormal   Collection Time   06/22/11  7:50 PM      Component Value Range Comment   Sodium 143  135 - 145 (mEq/L)    Potassium 3.3 (*) 3.5 - 5.1 (mEq/L)    Chloride 98  96 - 112 (mEq/L)    CO2 31  19 - 32 (mEq/L)    Glucose, Bld 107 (*) 70 - 99 (mg/dL)    BUN 12  6 - 23 (mg/dL)    Creatinine, Ser 2.95  0.50 - 1.10 (mg/dL)    Calcium 9.7  8.4 - 10.5 (mg/dL)    Total Protein 7.4  6.0 - 8.3 (g/dL)    Albumin 4.3  3.5 - 5.2 (g/dL)    AST 37  0 - 37 (U/L)    ALT 43 (*) 0 - 35 (U/L)    Alkaline Phosphatase 99  39 - 117 (U/L)    Total Bilirubin 0.2  (*) 0.3 - 1.2 (mg/dL)    GFR calc non Af Amer 84 (*) >90 (mL/min)    GFR calc Af Amer >90  >90 (mL/min)   ETHANOL     Status: Normal   Collection Time   06/22/11  7:50 PM      Component Value Range Comment   Alcohol, Ethyl (B) <11  0 - 11 (mg/dL)   PROCALCITONIN     Status: Normal   Collection Time   06/22/11  7:50 PM      Component Value Range Comment   Procalcitonin <0.10     PROTIME-INR     Status: Normal   Collection Time   06/22/11  7:50 PM      Component Value Range Comment   Prothrombin Time 13.3  11.6 - 15.2 (seconds)    INR 0.99  0.00 - 1.49    APTT     Status: Abnormal   Collection Time   06/22/11  7:50 PM      Component Value Range Comment   aPTT 22 (*) 24 - 37 (seconds)   ACETAMINOPHEN LEVEL     Status: Normal   Collection Time   06/22/11  7:50 PM      Component Value Range Comment   Acetaminophen (Tylenol), Serum <15.0  10 - 30 (ug/mL)   SALICYLATE LEVEL     Status: Abnormal   Collection Time   06/22/11  7:50 PM      Component Value Range Comment   Salicylate Lvl <2.0 (*) 2.8 - 20.0 (mg/dL)   LACTIC ACID, PLASMA     Status: Abnormal   Collection Time   06/22/11  7:54 PM      Component Value Range Comment   Lactic Acid, Venous 3.0 (*) 0.5 - 2.2 (mmol/L)   URINE RAPID DRUG SCREEN (HOSP PERFORMED)     Status: Normal   Collection Time   06/22/11  8:53 PM      Component Value Range  Comment   Opiates NONE DETECTED  NONE DETECTED     Cocaine NONE DETECTED  NONE DETECTED     Benzodiazepines NONE DETECTED  NONE DETECTED     Amphetamines NONE DETECTED  NONE DETECTED     Tetrahydrocannabinol NONE DETECTED  NONE DETECTED     Barbiturates NONE DETECTED  NONE DETECTED    URINALYSIS, ROUTINE W REFLEX MICROSCOPIC     Status: Abnormal   Collection Time   06/22/11  8:53 PM      Component Value Range Comment   Color, Urine YELLOW  YELLOW     APPearance HAZY (*) CLEAR     Specific Gravity, Urine 1.022  1.005 - 1.030     pH 6.5  5.0 - 8.0     Glucose, UA NEGATIVE  NEGATIVE (mg/dL)     Hgb urine dipstick NEGATIVE  NEGATIVE     Bilirubin Urine NEGATIVE  NEGATIVE     Ketones, ur NEGATIVE  NEGATIVE (mg/dL)    Protein, ur NEGATIVE  NEGATIVE (mg/dL)    Urobilinogen, UA 0.2  0.0 - 1.0 (mg/dL)    Nitrite NEGATIVE  NEGATIVE     Leukocytes, UA NEGATIVE  NEGATIVE  MICROSCOPIC NOT DONE ON URINES WITH NEGATIVE PROTEIN, BLOOD, LEUKOCYTES, NITRITE, OR GLUCOSE <1000 mg/dL.    Dg Chest 2 View  06/22/2011  *RADIOLOGY REPORT*  Clinical Data: Seizure  CHEST - 2 VIEW  Comparison: 01/02/2011  Findings: Hypoaeration results in interstitial and vascular crowding.  Elevated hemidiaphragms obscures the lung bases with suggestion of lung base atelectasis.  Within these limitations, no focal consolidation, pleural effusion, pneumothorax. Cardiomediastinal contours within normal limits.  No acute osseous finding.  IMPRESSION: Degraded by hypoaeration.  No definite radiographic evidence of acute cardiopulmonary process.  Original Report Authenticated By: Waneta Martins, M.D.   Ct Head Wo Contrast  06/22/2011  *RADIOLOGY REPORT*  Clinical Data: Seizure/drug overdose.  CT HEAD WITHOUT CONTRAST  Technique:  Contiguous axial images were obtained from the base of the skull through the vertex without contrast.  Comparison: 04/23/2011  Findings: Bone windows demonstrate clear paranasal sinuses and mastoid air cells.  Soft tissue windows demonstrate no  mass lesion, hemorrhage, hydrocephalus, acute infarct, intra-axial, or extra-axial fluid collection.  IMPRESSION: Normal head CT.  Original Report Authenticated By: Consuello Bossier, M.D.    @ROS @  The patient denies any recent fevers or chills.  The patient does note a mild headache.  The patient denies any chest pain, abdominal pain, or extremity pain.  The patient denies problems controlling the bowels or the bladder.  The patient does not report any numbness or weakness of the face, arms, or legs.   Blood pressure 125/82, pulse 91, temperature 98.1 F  (36.7 C), temperature source Rectal, resp. rate 20, height 5\' 1"  (1.549 m), weight 78.3 kg (172 lb 9.9 oz), SpO2 97.00%.  Physical Examination:  General:  The patient is alert and cooperative at the time of the examination. The patient is moderately obese.  Respiratory:  Lungs fields are clear to auscultation bilaterally.  Cardiovascular:  Examination reveals a regular rate and rhythm, no obvious murmurs or rubs are noted.  Abdominal Exam:  Abdomen is soft and nontender, positive bowel sounds are noted. No organomegaly is noted.  Extremities:  Extremities are without significant edema.    Neurologic Examination  Cranial Nerves:  Facial symmetry is present. Pupils are equal, round, and reactive to light. Visual fields are full. Speech is normal, no aphasia or dysarthria is noted. Pin  prick sensation on the face is symmetric and normal.  Motor Examination: Motor testing of all 4 extremities reveals normal strength of the arms and the legs.  Sensory Examination: Sensory examination of the arms and legs shows normal pinprick, soft touch, and vibration sensation throughout.  Cerebellar Examination: The patient has good finger-nose-finger and heel-to-shin bilaterally. Gait was not tested. Asterixis is noted with both upper extremities.  Deep Tendon Reflexes: Deep tendon reflexes are symmetric and normal throughout. Toes are downgoing bilaterally.    Assessment/Plan:  1. Seizures versus pseudoseizures  2. Restless leg syndrome  The patient will be placed on Vimpat at this time. This apparently was not used previously secondary to high liver enzyme levels. At this time, blood work as were also unremarkable. The patient will have a repeat EEG study. The patient indicates that she has undergone a video EEG monitoring study, and the results of the study will need to be obtained. Neurology will follow this patient while in house.   Joyice Magda KEITH 06/23/2011, 1:43 AM

## 2011-06-23 NOTE — Progress Notes (Signed)
Notified by family Pt was having another seizure. Only witnessed by family. Seizure activity stopped before I entered the room. Ativan 1mg  IV was given. Notified Dr. Toniann Fail. Vital signs stable: 153/85, HR 88, O2 99 2L [place]. Pt is now resting. Will continue to monitor. Earnest Conroy RN

## 2011-06-23 NOTE — Progress Notes (Signed)
Portable EEG completed

## 2011-06-23 NOTE — Progress Notes (Signed)
Patient complained of not urinating today, patient bladder scanned 821 ml seen then in and out cath preformed removed.

## 2011-06-23 NOTE — Progress Notes (Signed)
UR Completed Briella Hobday Graves-Bigelow, RN,BSN [phone]  

## 2011-06-24 DIAGNOSIS — R569 Unspecified convulsions: Secondary | ICD-10-CM

## 2011-06-24 DIAGNOSIS — G40309 Generalized idiopathic epilepsy and epileptic syndromes, not intractable, without status epilepticus: Secondary | ICD-10-CM

## 2011-06-24 DIAGNOSIS — R339 Retention of urine, unspecified: Secondary | ICD-10-CM | POA: Diagnosis present

## 2011-06-24 DIAGNOSIS — G2581 Restless legs syndrome: Secondary | ICD-10-CM

## 2011-06-24 LAB — COMPREHENSIVE METABOLIC PANEL
ALT: 31 U/L (ref 0–35)
AST: 26 U/L (ref 0–37)
Albumin: 3.2 g/dL — ABNORMAL LOW (ref 3.5–5.2)
Alkaline Phosphatase: 78 U/L (ref 39–117)
BUN: 11 mg/dL (ref 6–23)
CO2: 21 mEq/L (ref 19–32)
Calcium: 8.5 mg/dL (ref 8.4–10.5)
Chloride: 109 mEq/L (ref 96–112)
Creatinine, Ser: 0.53 mg/dL (ref 0.50–1.10)
GFR calc Af Amer: >90 mL/min (ref >90)
GFR calc non Af Amer: >90 mL/min (ref >90)
Glucose, Bld: 87 mg/dL (ref 70–99)
Potassium: 3.6 mEq/L (ref 3.5–5.1)
Sodium: 142 mEq/L (ref 135–145)
Total Bilirubin: 0.2 mg/dL — ABNORMAL LOW (ref 0.3–1.2)
Total Protein: 5.7 g/dL — ABNORMAL LOW (ref 6.0–8.3)

## 2011-06-24 LAB — CBC
HCT: 32.3 % — ABNORMAL LOW (ref 36.0–46.0)
Hemoglobin: 10.7 g/dL — ABNORMAL LOW (ref 12.0–15.0)
MCH: 29.3 pg (ref 26.0–34.0)
MCHC: 33.1 g/dL (ref 30.0–36.0)
MCV: 88.5 fL (ref 78.0–100.0)
Platelets: 227 10*3/uL (ref 150–400)
RBC: 3.65 MIL/uL — ABNORMAL LOW (ref 3.87–5.11)
RDW: 14.5 % (ref 11.5–15.5)
WBC: 5.8 10*3/uL (ref 4.0–10.5)

## 2011-06-24 LAB — IRON AND TIBC
Iron: 46 ug/dL (ref 42–135)
Saturation Ratios: 15 % — ABNORMAL LOW (ref 20–55)
TIBC: 304 ug/dL (ref 250–470)
UIBC: 258 ug/dL (ref 125–400)

## 2011-06-24 LAB — URINE CULTURE
Colony Count: NO GROWTH
Culture  Setup Time: 201306090000
Culture: NO GROWTH
Special Requests: NORMAL

## 2011-06-24 LAB — FOLATE: Folate: 11.8 ng/mL

## 2011-06-24 LAB — VITAMIN B12: Vitamin B-12: 515 pg/mL (ref 211–911)

## 2011-06-24 LAB — LACTIC ACID, PLASMA: Lactic Acid, Venous: 1.1 mmol/L (ref 0.5–2.2)

## 2011-06-24 LAB — FERRITIN: Ferritin: 17 ng/mL (ref 10–291)

## 2011-06-24 MED ORDER — POLYETHYLENE GLYCOL 3350 17 G PO PACK
17.0000 g | PACK | Freq: Every day | ORAL | Status: DC
  Administered 2011-06-24 – 2011-06-25 (×2): 17 g via ORAL
  Filled 2011-06-24 (×3): qty 1

## 2011-06-24 MED ORDER — POTASSIUM CHLORIDE CRYS ER 20 MEQ PO TBCR
EXTENDED_RELEASE_TABLET | ORAL | Status: AC
  Administered 2011-06-24: 40 meq via ORAL
  Filled 2011-06-24: qty 2

## 2011-06-24 MED ORDER — POTASSIUM CHLORIDE CRYS ER 20 MEQ PO TBCR
40.0000 meq | EXTENDED_RELEASE_TABLET | Freq: Once | ORAL | Status: AC
  Administered 2011-06-24: 40 meq via ORAL

## 2011-06-24 MED ORDER — TAMSULOSIN HCL 0.4 MG PO CAPS
0.4000 mg | ORAL_CAPSULE | Freq: Every day | ORAL | Status: DC
  Administered 2011-06-24 – 2011-06-25 (×2): 0.4 mg via ORAL
  Filled 2011-06-24 (×2): qty 1

## 2011-06-24 MED ORDER — LACOSAMIDE 50 MG PO TABS
100.0000 mg | ORAL_TABLET | Freq: Two times a day (BID) | ORAL | Status: DC
  Administered 2011-06-24 – 2011-06-25 (×2): 100 mg via ORAL
  Filled 2011-06-24 (×3): qty 2

## 2011-06-24 NOTE — Progress Notes (Addendum)
Pt c/o feeling of needing to urinate but not being able to; pt states she was able to urinate last this AM around 0450; pt was bladder scanned and found to have ; Dr.Thompson notified, order received to place foley cath; foley placed without difficulty and received a total of 1300cc of pale yellow urine out; will continue to monitor

## 2011-06-24 NOTE — Consult Note (Signed)
Urology Consult   Physician requesting consult: Janee Morn  Reason for consult: AUR  History of Present Illness: [name] is a 46 y.o. with PMH significant for epilepsy, seizures, and restless leg syndrome who was admitted 06/22/11 for eval and treatment of seizures.  Yesterday she began to complain of an inability to void.  She had a foley placed with approx 1L return.  Foley has been left in place and she has been continued on Enablex.  Urology consult was requested due to retention.  The pt denies any history of retention prior to yesterday.  Pt does have a hx of stress incontinence which was treated in 2009 with an obturator sling by Dr. Vonita Moss.  Since that time she has noted urinary hesitancy, feelings of incomplete emptying, and weak stream.  She was placed on Uroxatrol in the past and did well with this medication, however, it was eventually stopped.  Her f/u PVRs revealed small amounts.  Recently she was re-evaled for the same sx and was started on Enablex which she tolerated well but noted little improvement and then stopped herself because of cost.  She was then given a trial of [place] which she did not start prior to this admission.  She states she has not taken an anticholinergic for at least several weeks.  She denies hematuria, dysuria, recurrent UTIs, incontinence, and nocturia.    She currently feels well and denies CP, SOB, N/V, and diarrhea/constipation.     Past Medical History  Diagnosis Date  . Epilepsy   . Seizures   . Vertigo   . Constipation   . Restless leg     Past Surgical History  Procedure Date  . Total hip arthroplasty     Left Hip THR X 3  . Abdominal hysterectomy   . Colonoscopy     Current Hospital Medications: Prior to Admission medications   Medication Sig Start Date End Date Taking? Authorizing Provider  clonazePAM (KLONOPIN) 0.5 MG tablet Take 0.5 mg by mouth 3 (three) times daily as needed. For anxiety   Yes Historical Provider, MD    gabapentin (NEURONTIN) 600 MG tablet Take 600 mg by mouth 2 (two) times daily. 05/28/11  Yes Belkys A Regalado, MD  ibuprofen (ADVIL,MOTRIN) 800 MG tablet Take 800 mg by mouth every 8 (eight) hours as needed. For pain   Yes Historical Provider, MD  lamoTRIgine (LAMICTAL) 200 MG tablet Take 200 mg by mouth 2 (two) times daily.     Yes Historical Provider, MD  PARoxetine (PAXIL) 20 MG tablet Take 40 mg by mouth at bedtime. 03/30/11 03/29/12 Yes Vassie Loll, MD  rOPINIRole (REQUIP) 0.25 MG tablet Take 0.5 mg by mouth at bedtime.   Yes Historical Provider, MD  trospium (SANCTURA) 20 MG tablet Take 20 mg by mouth 2 (two) times daily.   Yes Historical Provider, MD    Scheduled Meds:   . darifenacin  7.5 mg Oral Daily  . gabapentin  300 mg Oral QHS  . lacosamide (VIMPAT) IV  100 mg Intravenous Q12H  . PARoxetine  40 mg Oral QHS  . potassium chloride  40 mEq Oral Once  . sodium chloride  3 mL Intravenous Q12H   Continuous Infusions:   . DISCONTD: sodium chloride 100 mL/hr at 06/23/11 1957   PRN Meds:.clonazePAM, ibuprofen, LORazepam, ondansetron (ZOFRAN) IV, ondansetron  Allergies:  Allergies  Allergen Reactions  . Benadryl (Diphenhydramine Hcl) Other (See Comments)    seizures  . Codeine     rash  .  Dilantin (Phenytoin Sodium Extended)     Elevated LFT's  . Melatonin Other (See Comments)    Not sure  . Ultram (Tramadol Hcl) Other (See Comments)    Reaction:Seizures  . Vicodin (Hydrocodone-Acetaminophen) Itching  . Latex Rash  . Penicillins Itching and Rash    Family History  Problem Relation Age of Onset  . Cancer Mother   . Cancer Brother   . Diabetes Brother   . Cancer Maternal Aunt   . Diabetes Maternal Aunt     Social History:  reports that she has never smoked. She has never used smokeless tobacco. She reports that she does not drink alcohol or use illicit drugs.  ROS: A complete review of systems was performed.  All systems are negative except for pertinent  findings as noted.  Physical Exam:  Vital signs in last 24 hours: Temp:  [98 F (36.7 C)-98.6 F (37 C)] 98.6 F (37 C) (06/11 1349) Pulse Rate:  [53-82] 78  (06/11 1349) Resp:  [18-20] 18  (06/11 1349) BP: (110-120)/(69-80) 120/80 mmHg (06/11 1349) SpO2:  [97 %-98 %] 98 % (06/11 1349) General:  Alert and oriented, No acute distress HEENT: Normocephalic, atraumatic Neck: No JVD or lymphadenopathy Cardiovascular: Regular rate and rhythm Lungs: Clear bilaterally Abdomen: Soft, nontender, nondistended, no abdominal masses Back: No CVA tenderness Extremities: No edema Neurologic: Grossly intact Foley in place with clear/yellow urine  Laboratory Data:   [facility] 06/24/11 0500 06/23/11 0542 06/22/11 1950  WBC 5.8 7.9 9.2  HGB 10.7* 10.7* 12.5  HCT 32.3* 32.5* 36.7  PLT 227 256 299     Basename 06/24/11 0500 06/23/11 0542 06/22/11 1950  NA 142 146* 143  K 3.6 3.0* 3.3*  CL 109 109 98  GLUCOSE 87 87 107*  BUN 11 11 12   CALCIUM 8.5 8.4 9.7  CREATININE 0.53 0.60 0.83     Results for orders placed during the hospital encounter of 06/22/11 (from the past 24 hour(s))  VITAMIN B12     Status: Normal   Collection Time   06/24/11  5:00 AM      Component Value Range   Vitamin B-12 515  211 - 911 (pg/mL)  FOLATE     Status: Normal   Collection Time   06/24/11  5:00 AM      Component Value Range   Folate 11.8    IRON AND TIBC     Status: Abnormal   Collection Time   06/24/11  5:00 AM      Component Value Range   Iron 46  42 - 135 (ug/dL)   TIBC 161  096 - 045 (ug/dL)   Saturation Ratios 15 (*) 20 - 55 (%)   UIBC 258  125 - 400 (ug/dL)  FERRITIN     Status: Normal   Collection Time   06/24/11  5:00 AM      Component Value Range   Ferritin 17  10 - 291 (ng/mL)  COMPREHENSIVE METABOLIC PANEL     Status: Abnormal   Collection Time   06/24/11  5:00 AM      Component Value Range   Sodium 142  135 - 145 (mEq/L)   Potassium 3.6  3.5 - 5.1 (mEq/L)   Chloride 109  96 - 112  (mEq/L)   CO2 21  19 - 32 (mEq/L)   Glucose, Bld 87  70 - 99 (mg/dL)   BUN 11  6 - 23 (mg/dL)   Creatinine, Ser 4.09  0.50 - 1.10 (mg/dL)  Calcium 8.5  8.4 - 10.5 (mg/dL)   Total Protein 5.7 (*) 6.0 - 8.3 (g/dL)   Albumin 3.2 (*) 3.5 - 5.2 (g/dL)   AST 26  0 - 37 (U/L)   ALT 31  0 - 35 (U/L)   Alkaline Phosphatase 78  39 - 117 (U/L)   Total Bilirubin 0.2 (*) 0.3 - 1.2 (mg/dL)   GFR calc non Af Amer >90  >90 (mL/min)   GFR calc Af Amer >90  >90 (mL/min)  CBC     Status: Abnormal   Collection Time   06/24/11  5:00 AM      Component Value Range   WBC 5.8  4.0 - 10.5 (K/uL)   RBC 3.65 (*) 3.87 - 5.11 (MIL/uL)   Hemoglobin 10.7 (*) 12.0 - 15.0 (g/dL)   HCT 11.9 (*) 14.7 - 46.0 (%)   MCV 88.5  78.0 - 100.0 (fL)   MCH 29.3  26.0 - 34.0 (pg)   MCHC 33.1  30.0 - 36.0 (g/dL)   RDW 82.9  56.2 - 13.0 (%)   Platelets 227  150 - 400 (K/uL)  LACTIC ACID, PLASMA     Status: Normal   Collection Time   06/24/11  9:00 AM      Component Value Range   Lactic Acid, Venous 1.1  0.5 - 2.2 (mmol/L)   Recent Results (from the past 240 hour(s))  URINE CULTURE     Status: Normal   Collection Time   06/22/11  8:54 PM      Component Value Range Status Comment   Specimen Description URINE, CATHETERIZED   Final    Special Requests Normal   Final    Culture  Setup Time [id number]   Final    Colony Count NO GROWTH   Final    Culture NO GROWTH   Final    Report Status 06/24/2011 FINAL   Final     Renal Function:  Basename 06/24/11 0500 06/23/11 0542 06/22/11 1950  CREATININE 0.53 0.60 0.83   Estimated Creatinine Clearance: 84.1 ml/min (by C-G formula based on Cr of 0.53).  Radiologic Imaging: Dg Chest 2 View  06/22/2011  *RADIOLOGY REPORT*  Clinical Data: Seizure  CHEST - 2 VIEW  Comparison: 01/02/2011  Findings: Hypoaeration results in interstitial and vascular crowding.  Elevated hemidiaphragms obscures the lung bases with suggestion of lung base atelectasis.  Within these limitations, no focal  consolidation, pleural effusion, pneumothorax. Cardiomediastinal contours within normal limits.  No acute osseous finding.  IMPRESSION: Degraded by hypoaeration.  No definite radiographic evidence of acute cardiopulmonary process.  Original Report Authenticated By: Waneta Martins, M.D.   Ct Head Wo Contrast  06/22/2011  *RADIOLOGY REPORT*  Clinical Data: Seizure/drug overdose.  CT HEAD WITHOUT CONTRAST  Technique:  Contiguous axial images were obtained from the base of the skull through the vertex without contrast.  Comparison: 04/23/2011  Findings: Bone windows demonstrate clear paranasal sinuses and mastoid air cells.  Soft tissue windows demonstrate no  mass lesion, hemorrhage, hydrocephalus, acute infarct, intra-axial, or extra-axial fluid collection.  IMPRESSION: Normal head CT.  Original Report Authenticated By: Consuello Bossier, M.D.    Impression/Assessment:   AUR in pt with hx of LUTS   Plan:  Continue foley for approx 7 days.  She will need a void trial at that time.  If she has been discharged home this can be performed at our office as an outpt.    D/c Enablex and all other anticholinergics as these relax  bladder muscle  Start Flomax 0.4mg  po QD  Pt will need urodynamics in the future after her current medical issues are resolved.   Silas [name] 06/24/2011, 3:39 PM   Pt seen and interviewed.   I agree with above note. Keep off anticholinergics, continue tamsulosin, and treat constipation (quite likely could cause or exacerbate retention).  SMD

## 2011-06-24 NOTE — Progress Notes (Signed)
Subjective: Patient states no further seizure episodes in last 24 hours. Patient c/o urinary retention. Per nursing patient with 821cc per bladder scan and i/o cath with 1L removed.  Objective: Vital signs in last 24 hours: Filed Vitals:   06/23/11 0500 06/23/11 1402 06/23/11 2100 06/24/11 0500  BP: 97/60 109/75 110/69 112/76  Pulse: 81 73 82 53  Temp: 98 F (36.7 C) 98.5 F (36.9 C) 98.3 F (36.8 C) 98 F (36.7 C)  TempSrc:  Oral    Resp: 18 20 20 20   Height:      Weight:      SpO2: 100% 96% 97% 98%    Intake/Output Summary (Last 24 hours) at 06/24/11 1334 Last data filed at 06/24/11 0900  Gross per 24 hour  Intake    360 ml  Output   2300 ml  Net  -1940 ml    Weight change:   General: Alert, awake, oriented x3, in no acute distress. HEENT: No bruits, no goiter. Heart: Regular rate and rhythm, without murmurs, rubs, gallops. Lungs: Clear to auscultation bilaterally. Abdomen: Soft, nontender, nondistended, positive bowel sounds. Extremities: No clubbing cyanosis or edema with positive pedal pulses. Neuro: Grossly intact, nonfocal.    Lab Results:  Basename 06/24/11 0500 06/23/11 0900 06/23/11 0542  NA 142 -- 146*  K 3.6 -- 3.0*  CL 109 -- 109  CO2 21 -- 28  GLUCOSE 87 -- 87  BUN 11 -- 11  CREATININE 0.53 -- 0.60  CALCIUM 8.5 -- 8.4  MG -- 2.3 --  PHOS -- -- --    Basename 06/24/11 0500 06/23/11 0542  AST 26 25  ALT 31 31  ALKPHOS 78 80  BILITOT 0.2* 0.2*  PROT 5.7* 5.7*  ALBUMIN 3.2* 3.3*   No results found for this basename: LIPASE:2,AMYLASE:2 in the last 72 hours  Basename 06/24/11 0500 06/23/11 0542 06/22/11 1950  WBC 5.8 7.9 --  NEUTROABS -- 4.3 6.2  HGB 10.7* 10.7* --  HCT 32.3* 32.5* --  MCV 88.5 88.3 --  PLT 227 256 --   No results found for this basename: CKTOTAL:3,CKMB:3,CKMBINDEX:3,TROPONINI:3 in the last 72 hours No components found with this basename: POCBNP:3 No results found for this basename: DDIMER:2 in the last 72 hours No  results found for this basename: HGBA1C:2 in the last 72 hours No results found for this basename: CHOL:2,HDL:2,LDLCALC:2,TRIG:2,CHOLHDL:2,LDLDIRECT:2 in the last 72 hours No results found for this basename: TSH,T4TOTAL,FREET3,T3FREE,THYROIDAB in the last 72 hours  Basename 06/24/11 0500  VITAMINB12 515  FOLATE 11.8  FERRITIN 17  TIBC 304  IRON 46  RETICCTPCT --    Micro Results: Recent Results (from the past 240 hour(s))  URINE CULTURE     Status: Normal   Collection Time   06/22/11  8:54 PM      Component Value Range Status Comment   Specimen Description URINE, CATHETERIZED   Final    Special Requests Normal   Final    Culture  Setup Time [id number]   Final    Colony Count NO GROWTH   Final    Culture NO GROWTH   Final    Report Status 06/24/2011 FINAL   Final     Studies/Results: Dg Chest 2 View  06/22/2011  *RADIOLOGY REPORT*  Clinical Data: Seizure  CHEST - 2 VIEW  Comparison: 01/02/2011  Findings: Hypoaeration results in interstitial and vascular crowding.  Elevated hemidiaphragms obscures the lung bases with suggestion of lung base atelectasis.  Within these limitations, no focal consolidation, pleural effusion,  pneumothorax. Cardiomediastinal contours within normal limits.  No acute osseous finding.  IMPRESSION: Degraded by hypoaeration.  No definite radiographic evidence of acute cardiopulmonary process.  Original Report Authenticated By: Waneta Martins, M.D.   Ct Head Wo Contrast  06/22/2011  *RADIOLOGY REPORT*  Clinical Data: Seizure/drug overdose.  CT HEAD WITHOUT CONTRAST  Technique:  Contiguous axial images were obtained from the base of the skull through the vertex without contrast.  Comparison: 04/23/2011  Findings: Bone windows demonstrate clear paranasal sinuses and mastoid air cells.  Soft tissue windows demonstrate no  mass lesion, hemorrhage, hydrocephalus, acute infarct, intra-axial, or extra-axial fluid collection.  IMPRESSION: Normal head CT.  Original Report  Authenticated By: Consuello Bossier, M.D.    Medications:     . darifenacin  7.5 mg Oral Daily  . gabapentin  300 mg Oral QHS  . lacosamide (VIMPAT) IV  100 mg Intravenous Q12H  . PARoxetine  40 mg Oral QHS  . potassium chloride  40 mEq Oral Once  . sodium chloride  3 mL Intravenous Q12H    Assessment: Active Problems:  Seizures  Hypokalemia  Anemia  Urinary retention   Plan: #1 Seizures ??etiology. Patient with hx of seizures. EEG abnormal with rare bifrontal sharp transients c/w hx seizures. Continue Vimpat per neurology. Replete electrolytes. Ativan prn. Per Neurology.  #2 Restless leg syndrome Monitor for now. ??resume requip will defer to neuro  #3. Hypokalemia Replete. Will give kdur x 1 today to keep potassium approx at 4.  #4. Urinary retention Place foley catheter, continue enablex. NSL IVF. Will consult with urology for further evaluation and rxcs.  #5. Anxiety Continue paxil and klonopin.  #6. Prophylaxis SCDs for DVT.  LOS: 2 days   Chief Walkup [phone] 06/24/2011, 1:34 PM

## 2011-06-24 NOTE — Procedures (Signed)
REFERRING PHYSICIAN:  Marlan Palau, MD  HISTORY:  A 46 year old female with a history of seizures and pseudoseizures presenting with multiple breakthrough seizures.  MEDICATIONS:  Enablex, Neurontin, Vimpat, Paxil, potassium, Dilantin.  CONDITIONS OF RECORDING:  This is a 16 channel EEG carried out with the patient in the awake state.  DESCRIPTION:  The waking background activity is poorly sustained but consists of a low-voltage, symmetrical, fairly well-organized 10 Hz alpha activity seen from the parieto-occipital and posterior temporal regions.  Low-voltage fast activity, poorly organized was seen anteriorly and at times superimposed on more posterior rhythms.  A mixture of theta and alpha was seen from the central and temporal regions.  On rare occasions during the tracing, there is noted some sharp activity that is seen bifrontally.  The patient does not drowse or sleep.  Hyperventilation was performed with a moderate buildup noted but no change in the tracing was noted otherwise.  Intermittent photic stimulation was performed but failed to elicit any change in the tracing.  IMPRESSION:  This is an abnormal EEG secondary to rare bifrontal sharp transients, which is consistent with the patient's history of seizure.          ______________________________ Thana Farr, MD    [id number] D:  06/24/2011 09:13:10  T:  06/24/2011 12:24:41  Job #:  [id number]

## 2011-06-24 NOTE — Progress Notes (Signed)
[facility] PROGRESS NOTE    SUBJECTIVE   Patient has had no episodes since admission.  She is feel well and has no complaints. She has had no adverse effects to Vimpat after 3 doses. Patient states she sees [name] as a out patient at [facility] neurology.   OBJECTIVE   Vital signs in last 24 hours: Temp:  [98 F (36.7 C)-98.5 F (36.9 C)] 98 F (36.7 C) (06/11 0500) Pulse Rate:  [53-82] 53  (06/11 0500) Resp:  [20] 20  (06/11 0500) BP: (109-112)/(69-76) 112/76 mmHg (06/11 0500) SpO2:  [96 %-98 %] 98 % (06/11 0500)  Intake/Output from previous day: 06/10 0701 - 06/11 0700 In: 1000 [P.O.:1000] Out: 1000 [Urine:1000] Intake/Output this shift:   Nutritional status: General  Past Medical History  Diagnosis Date  . Epilepsy   . Seizures   . Vertigo   . Constipation   . Restless leg     Neurologic Exam:  Mental Status: Alert, oriented, thought content appropriate.  Speech fluent without evidence of aphasia. Able to follow 3 step commands without difficulty. Cranial Nerves: II-Visual fields grossly intact. III/IV/VI-Extraocular movements intact.  Pupils reactive bilaterally. V/VII-Smile symmetric VIII-grossly intact IX/X-normal gag XI-bilateral shoulder shrug XII-midline tongue extension Motor: 4/5 bilaterally with normal tone and bulk Sensory: Pinprick and light touch intact throughout, bilaterally Deep Tendon Reflexes: 2+ and symmetric throughout Plantars downgoing bilaterally Cerebellar: Normal finger-to-nose, normal rapid alternating movements and normal heel-to-shin test.      Lab Results: No results found for this basename: cbc, bmp, coags, chol, tri, ldl, hga1c   Lipid Panel No results found for this basename: CHOL,TRIG,HDL,CHOLHDL,VLDL,LDLCALC in the last 72 hours  Studies/Results: Dg Chest 2 View  06/22/2011  *RADIOLOGY REPORT*  Clinical Data: Seizure  CHEST - 2 VIEW  Comparison: 01/02/2011  Findings:  Hypoaeration results in interstitial and vascular crowding.  Elevated hemidiaphragms obscures the lung bases with suggestion of lung base atelectasis.  Within these limitations, no focal consolidation, pleural effusion, pneumothorax. Cardiomediastinal contours within normal limits.  No acute osseous finding.  IMPRESSION: Degraded by hypoaeration.  No definite radiographic evidence of acute cardiopulmonary process.  Original Report Authenticated By: Waneta Martins, M.D.   Ct Head Wo Contrast  06/22/2011  *RADIOLOGY REPORT*  Clinical Data: Seizure/drug overdose.  CT HEAD WITHOUT CONTRAST  Technique:  Contiguous axial images were obtained from the base of the skull through the vertex without contrast.  Comparison: 04/23/2011  Findings: Bone windows demonstrate clear paranasal sinuses and mastoid air cells.  Soft tissue windows demonstrate no  mass lesion, hemorrhage, hydrocephalus, acute infarct, intra-axial, or extra-axial fluid collection.  IMPRESSION: Normal head CT.  Original Report Authenticated By: Consuello Bossier, M.D.    Medications:     Scheduled:   . darifenacin  7.5 mg Oral Daily  . gabapentin  300 mg Oral QHS  . lacosamide (VIMPAT) IV  100 mg Intravenous Q12H  . PARoxetine  40 mg Oral QHS  . potassium chloride SA      . potassium chloride  40 mEq Oral Q4H  . potassium chloride  40 mEq Oral Once  . sodium chloride  3 mL Intravenous Q12H    Assessment/Plan:   46 YO female with history of seizure versus pseudoseizure presenting with 5 episodes of seizure like  activity . Recently taken off Dilantin due to elevated liver enzymes.    Recommend: 1) Continue Vimpat 100 mg BID PO and have follow up with [name] at [facility] 2) EEG pending  Neurology will S/O at this time.  Please call with questions.    Felicie Morn PA-C [facility] [phone]  06/24/2011, 9:24 AM

## 2011-06-25 DIAGNOSIS — R339 Retention of urine, unspecified: Secondary | ICD-10-CM

## 2011-06-25 DIAGNOSIS — R569 Unspecified convulsions: Secondary | ICD-10-CM

## 2011-06-25 DIAGNOSIS — G2581 Restless legs syndrome: Secondary | ICD-10-CM

## 2011-06-25 LAB — BASIC METABOLIC PANEL
BUN: 11 mg/dL (ref 6–23)
CO2: 22 mEq/L (ref 19–32)
Calcium: 9.3 mg/dL (ref 8.4–10.5)
Chloride: 108 mEq/L (ref 96–112)
Creatinine, Ser: 0.56 mg/dL (ref 0.50–1.10)
GFR calc Af Amer: >90 mL/min (ref >90)
GFR calc non Af Amer: >90 mL/min (ref >90)
Glucose, Bld: 89 mg/dL (ref 70–99)
Potassium: 4.1 mEq/L (ref 3.5–5.1)
Sodium: 143 mEq/L (ref 135–145)

## 2011-06-25 LAB — CBC
HCT: 35.4 % — ABNORMAL LOW (ref 36.0–46.0)
Hemoglobin: 11.7 g/dL — ABNORMAL LOW (ref 12.0–15.0)
MCH: 28.5 pg (ref 26.0–34.0)
MCHC: 33.1 g/dL (ref 30.0–36.0)
MCV: 86.1 fL (ref 78.0–100.0)
Platelets: 243 10*3/uL (ref 150–400)
RBC: 4.11 MIL/uL (ref 3.87–5.11)
RDW: 14.4 % (ref 11.5–15.5)
WBC: 6.3 10*3/uL (ref 4.0–10.5)

## 2011-06-25 MED ORDER — POLYETHYLENE GLYCOL 3350 17 G PO PACK: 17.0000 g | PACK | Freq: Every day | ORAL | Status: AC

## 2011-06-25 MED ORDER — LACOSAMIDE 100 MG PO TABS: 100.0000 mg | ORAL_TABLET | Freq: Two times a day (BID) | ORAL | Status: DC

## 2011-06-25 MED ORDER — TAMSULOSIN HCL 0.4 MG PO CAPS: 0.4000 mg | ORAL_CAPSULE | Freq: Every day | ORAL | Status: DC

## 2011-06-25 NOTE — Plan of Care (Signed)
Problem: Phase III Progression Outcomes Goal: IV meds to PO Outcome: Completed/Met Date Met:  06/25/11 Vimpat changed to PO

## 2011-06-25 NOTE — Plan of Care (Signed)
Problem: Phase I Progression Outcomes Goal: Initial discharge plan identified Outcome: Completed/Met Date Met:  06/25/11 Home with husband when ready

## 2011-06-25 NOTE — Discharge Summary (Signed)
Discharge Summary  [name] MR#: [id number]  DOB:1965-04-23  Date of Admission: 06/22/2011 Date of Discharge: 06/25/2011  Patient's PCP: Rudi Heap, MD  Primary neurologist: Dr. Avie Echevaria  Primary psychiatrist: Dr. Geanie Cooley at [facility] psychiat[facility].   Attending Physician:Jaydyn Menon  Consults: 1. Urology: Marcine Matar, MD 2. Neurology: Dr. Lesia Sago   Discharge Diagnoses: Active Problems:  Seizures  Hypokalemia  Anemia  Urinary retention  Rest less leg syndrome. Anxiety and depression  Brief Admitting History and Physical 47 year old right-handed white female with a history of seizures and possible pseudoseizures. The patient has apparently been in the hospital recently, and she had been noted to have increased liver enzymes on Dilantin. The patient was treated with gabapentin and Lamictal. The patient has remained on gabapentin, Lamictal and Requip (confirmed with patient). The patient comes in with at least 5 seizures on the day of admission, with generalized jerking. The patient has not had tongue biting. In the past, EEG studies have been normal. The hospitalist service was requested to admit for further evaluation and management.   Discharge Medications Current Discharge Medication List    START taking these medications   Details  lacosamide 100 MG TABS Take 1 tablet (100 mg total) by mouth 2 (two) times daily. Qty: 60 tablet, Refills: 0    polyethylene glycol (MIRALAX / GLYCOLAX) packet Take 17 g by mouth daily.    Tamsulosin HCl (FLOMAX) 0.4 MG CAPS Take 1 capsule (0.4 mg total) by mouth daily. Qty: 30 capsule, Refills: 0      CONTINUE these medications which have NOT CHANGED   Details  clonazePAM (KLONOPIN) 0.5 MG tablet Take 0.5 mg by mouth 3 (three) times daily as needed. For anxiety    gabapentin (NEURONTIN) 600 MG tablet Take 600 mg by mouth 2 (two) times daily.    ibuprofen (ADVIL,MOTRIN) 800 MG tablet Take 800 mg by mouth every 8 (eight)  hours as needed. For pain    PARoxetine (PAXIL) 20 MG tablet Take 40 mg by mouth at bedtime.    rOPINIRole (REQUIP) 0.25 MG tablet Take 0.5 mg by mouth at bedtime.      STOP taking these medications     lamoTRIgine (LAMICTAL) 200 MG tablet Comments:  Reason for Stopping:       trospium (SANCTURA) 20 MG tablet Comments:  Reason for Stopping:          Hospital Course: <principal problem not specified> Present on Admission:  .Seizures .Hypokalemia .Anemia .Urinary retention   1. Seizures versus pseudoseizures: Neurology was consulted. They reinitiated Vimpat. Discussed with the neurology service today and they have cleared her for discharge home and recommended continuing prior home dosage of gabapentin with the addition of Vimpat. They recommend discontinuing Lamictal secondary to abnormal liver function tests. Patient has not had seizure in approximately 48 hours. She has been counseled to not drive until cleared by her neurologist. 2. Hypokalemia: Repleted. 3. Urinary retention: Urology consulted. They recommend continuing Foley catheter for approximately 7 days. She is to followup with the urology service as an outpatient where a voiding trials will be performed and she will need urodynamics also. They recommend discontinuing Sanctura and other anticholinergics. They have started him on Flomax. She is advised to followup with the urology service for further management. She has been placed on MiraLAX for constipation which could also contributing to this. She will go home on the Foley catheter until followup with the urologist. 4. History of anxiety and depression: Patient denies any delusions, hallucinations or  suicidal or homicidal ideations. She has a followup appointment with her primary psychiatrist tomorrow which she is advised to keep. 5. Restless leg syndrome: Continue prior home dose of Requip as discussed with the neurologist. 6. Normocytic anemia/iron deficiency: Outpatient  evaluation and followup as deemed necessary. May consider starting iron supplements pending evaluation. 7. History of abnormal liver function tests: Possibly secondary to medications. Seems to have resolved.  Day of Discharge  Complaints: Denies any complaints and is eager to go home. No seizures since the early morning hours of 06/23/11.  Physical exam:  BP 96/67  Pulse 81  Temp 98.2 F (36.8 C) (Oral)  Resp 18  Ht 5\' 1"  (1.549 m)  Wt 78.3 kg (172 lb 9.9 oz)  BMI 32.62 kg/m2  SpO2 98%  general exam: Comfortable. Respiratory system: Clear. No increased work of breathing. Cardiovascular system: First and second heart sounds heard, regular. No JVD or murmurs or pedal edema. Telemetry shows sinus rhythm in the 60s to 70s without arrhythmia alarms. Gastrointestinal system: Abdomen is nondistended, soft, nontender and normal bowel sounds are heard. Central nervous system: Alert and oriented. No focal neurological deficits. Extremities: Symmetric 5 x 5 power.   Basic Metabolic Panel:  Lab 06/25/11 1610 06/24/11 0500 06/23/11 0542  NA 143 142 146*  K 4.1 3.6 3.0*  CL 108 109 109  CO2 22 21 28   GLUCOSE 89 87 87  BUN 11 11 11   CREATININE 0.56 0.53 0.60  CALCIUM 9.3 8.5 8.4  ALB -- -- --  PHOS -- -- --   Liver Function Tests:  Lab 06/24/11 0500 06/23/11 0542 06/22/11 1950  AST 26 25 37  ALT 31 31 43*  ALKPHOS 78 80 99  BILITOT 0.2* 0.2* 0.2*  PROT 5.7* 5.7* 7.4  ALBUMIN 3.2* 3.3* 4.3   No results found for this basename: LIPASE:3,AMYLASE:3 in the last 168 hours No results found for this basename: AMMONIA:3 in the last 168 hours CBC:  Lab 06/25/11 0500 06/24/11 0500 06/23/11 0542 06/22/11 1950  WBC 6.3 5.8 7.9 --  NEUTROABS -- -- 4.3 6.2  HGB 11.7* 10.7* 10.7* --  HCT 35.4* 32.3* 32.5* --  MCV 86.1 88.5 88.3 86.2  PLT 243 227 256 --   Cardiac Enzymes: No results found for this basename: CKTOTAL:5,CKMB:5,CKMBINDEX:5,TROPONINI:5 in the last 168 hours CBG: No results  found for this basename: GLUCAP:5 in the last 168 hours  Other lab data:  1. Venous lactate: On admission 2.7. On 6/11:1.1. 2. Anemia panel: Iron 46, total iron binding capacity 304, ferritin 17, folate 11.8 and vitamin B12 515. 3. INR: 0.99. 4. Salicylate level less than 2 and acetaminophen level less than 15. 5. Urine pregnancy test negative.  6. Urinalysis: Negative for features of urinary tract infection. 7. Urine drug screen: Negative 8. Blood alcohol level: Less than 11. 9. Urine culture: Negative.   Dg Chest 2 View  06/22/2011  *RADIOLOGY REPORT*  Clinical Data: Seizure  CHEST - 2 VIEW  Comparison: 01/02/2011  Findings: Hypoaeration results in interstitial and vascular crowding.  Elevated hemidiaphragms obscures the lung bases with suggestion of lung base atelectasis.  Within these limitations, no focal consolidation, pleural effusion, pneumothorax. Cardiomediastinal contours within normal limits.  No acute osseous finding.  IMPRESSION: Degraded by hypoaeration.  No definite radiographic evidence of acute cardiopulmonary process.  Original Report Authenticated By: Waneta Martins, M.D.   Ct Head Wo Contrast  06/22/2011  *RADIOLOGY REPORT*  Clinical Data: Seizure/drug overdose.  CT HEAD WITHOUT CONTRAST  Technique:  Contiguous axial images were obtained from the base of the skull through the vertex without contrast.  Comparison: 04/23/2011  Findings: Bone windows demonstrate clear paranasal sinuses and mastoid air cells.  Soft tissue windows demonstrate no  mass lesion, hemorrhage, hydrocephalus, acute infarct, intra-axial, or extra-axial fluid collection.  IMPRESSION: Normal head CT.  Original Report Authenticated By: Consuello Bossier, M.D.     EEG: IMPRESSION: This is an abnormal EEG secondary to rare bifrontal sharp transients, which is consistent with the patient's history of seizure.    Disposition: discharged home in stable condition.  Diet:  Heart healthy.  Activity:   Increase activity gradually.   Follow-up Appts: Discharge Orders    Future Orders Please Complete By Expires   Diet - low sodium heart healthy      Increase activity slowly      Discharge instructions      Comments:   1. Keep Foley catheter until seen by urologist as outpatient. 2. Do not drive. You have to be cleared by your neurologist prior to driving. 3. Avoid standing water, heights or any other situation that would be dangerous if you have a seizure.   Call MD for:      Comments:   If you are having seizure.      TESTS THAT NEED FOLLOW-UP  none.  Time spent on discharge, talking to the patient, and coordinating care:  30 mins.   SignedMarcellus Scott, MD 06/25/2011, 11:52 AM

## 2011-06-25 NOTE — Progress Notes (Signed)
D/c orders received;IV removed with gauze on; pt remains in stable condition, pt meds and instructions reviewed and given to pt; pt going home with foley catheter; pt instructed to leave catheter in until her f/u appointment, pt instructed to leave foley below her bladder, how to empty the bag, and how to clean foley; pt also made aware to not drive until cleared by neurologist; pt d/c to home

## 2011-07-16 ENCOUNTER — Inpatient Hospital Stay (HOSPITAL_COMMUNITY)
Admission: EM | Admit: 2011-07-16 | Discharge: 2011-07-18 | DRG: 918 | Disposition: A | Discharge status: Home / Self Care | Payer: Medicare Other | Attending: Internal Medicine | Admitting: Internal Medicine

## 2011-07-16 ENCOUNTER — Encounter (HOSPITAL_COMMUNITY): Payer: Self-pay | Admitting: *Deleted

## 2011-07-16 DIAGNOSIS — T50911A Poisoning by multiple unspecified drugs, medicaments and biological substances, accidental (unintentional), initial encounter: Secondary | ICD-10-CM

## 2011-07-16 DIAGNOSIS — T424X4A Poisoning by benzodiazepines, undetermined, initial encounter: Secondary | ICD-10-CM | POA: Diagnosis present

## 2011-07-16 DIAGNOSIS — Z79899 Other long term (current) drug therapy: Secondary | ICD-10-CM

## 2011-07-16 DIAGNOSIS — T424X1A Poisoning by benzodiazepines, accidental (unintentional), initial encounter: Secondary | ICD-10-CM | POA: Diagnosis present

## 2011-07-16 DIAGNOSIS — R945 Abnormal results of liver function studies: Secondary | ICD-10-CM

## 2011-07-16 DIAGNOSIS — Z880 Allergy status to penicillin: Secondary | ICD-10-CM

## 2011-07-16 DIAGNOSIS — T50901A Poisoning by unspecified drugs, medicaments and biological substances, accidental (unintentional), initial encounter: Secondary | ICD-10-CM

## 2011-07-16 DIAGNOSIS — T48201A Poisoning by unspecified drugs acting on muscles, accidental (unintentional), initial encounter: Secondary | ICD-10-CM | POA: Diagnosis present

## 2011-07-16 DIAGNOSIS — F411 Generalized anxiety disorder: Secondary | ICD-10-CM | POA: Diagnosis present

## 2011-07-16 DIAGNOSIS — T43201A Poisoning by unspecified antidepressants, accidental (unintentional), initial encounter: Secondary | ICD-10-CM | POA: Diagnosis present

## 2011-07-16 DIAGNOSIS — F3289 Other specified depressive episodes: Secondary | ICD-10-CM | POA: Diagnosis present

## 2011-07-16 DIAGNOSIS — F1910 Other psychoactive substance abuse, uncomplicated: Secondary | ICD-10-CM | POA: Diagnosis present

## 2011-07-16 DIAGNOSIS — Z888 Allergy status to other drugs, medicaments and biological substances status: Secondary | ICD-10-CM

## 2011-07-16 DIAGNOSIS — Z809 Family history of malignant neoplasm, unspecified: Secondary | ICD-10-CM

## 2011-07-16 DIAGNOSIS — F32A Depression, unspecified: Secondary | ICD-10-CM

## 2011-07-16 DIAGNOSIS — Z833 Family history of diabetes mellitus: Secondary | ICD-10-CM

## 2011-07-16 DIAGNOSIS — E876 Hypokalemia: Secondary | ICD-10-CM

## 2011-07-16 DIAGNOSIS — R339 Retention of urine, unspecified: Secondary | ICD-10-CM | POA: Diagnosis present

## 2011-07-16 DIAGNOSIS — G40909 Epilepsy, unspecified, not intractable, without status epilepticus: Secondary | ICD-10-CM | POA: Diagnosis present

## 2011-07-16 DIAGNOSIS — D649 Anemia, unspecified: Secondary | ICD-10-CM | POA: Diagnosis present

## 2011-07-16 DIAGNOSIS — Z886 Allergy status to analgesic agent status: Secondary | ICD-10-CM

## 2011-07-16 DIAGNOSIS — T443X1A Poisoning by other parasympatholytics [anticholinergics and antimuscarinics] and spasmolytics, accidental (unintentional), initial encounter: Principal | ICD-10-CM | POA: Diagnosis present

## 2011-07-16 DIAGNOSIS — T43294A Poisoning by other antidepressants, undetermined, initial encounter: Secondary | ICD-10-CM | POA: Diagnosis present

## 2011-07-16 DIAGNOSIS — F329 Major depressive disorder, single episode, unspecified: Secondary | ICD-10-CM | POA: Diagnosis present

## 2011-07-16 LAB — URINALYSIS, ROUTINE W REFLEX MICROSCOPIC
Bilirubin Urine: NEGATIVE
Glucose, UA: NEGATIVE mg/dL
Hgb urine dipstick: NEGATIVE
Ketones, ur: NEGATIVE mg/dL
Leukocytes, UA: NEGATIVE
Nitrite: NEGATIVE
Protein, ur: NEGATIVE mg/dL
Specific Gravity, Urine: 1.015 (ref 1.005–1.030)
Urobilinogen, UA: 0.2 mg/dL (ref 0.0–1.0)
pH: 6 (ref 5.0–8.0)

## 2011-07-16 LAB — RAPID URINE DRUG SCREEN, HOSP PERFORMED
Amphetamines: NOT DETECTED
Barbiturates: NOT DETECTED
Benzodiazepines: NOT DETECTED
Cocaine: NOT DETECTED
Opiates: NOT DETECTED
Tetrahydrocannabinol: NOT DETECTED

## 2011-07-16 MED ORDER — SODIUM CHLORIDE 0.9 % IV SOLN: Freq: Once | INTRAVENOUS | Status: AC

## 2011-07-16 MED ORDER — SODIUM CHLORIDE 0.9 % IV BOLUS (SEPSIS)
1000.0000 mL | INTRAVENOUS | Status: AC
  Administered 2011-07-16: 1000 mL via INTRAVENOUS

## 2011-07-16 NOTE — ED Notes (Addendum)
Overdose 1 hour ago. Awake  On arrival,  Son with her.  Took 400 mg of lamictal, 200 mg vimpat,  10 mg klonipin. 40 mg paxil , 1800 mg neurontin.. And unknown amt of flomax,  Son reports she took the meds and threw the pill bottles at her father

## 2011-07-16 NOTE — ED Provider Notes (Cosign Needed)
History   This chart was scribed for [employer]. Colon Branch, MD by Melba Coon. The patient was seen in room APA15/APA15 and the patient's care was started at 11:13PM.   CSN: [id number]  Arrival date & time 07/16/11  2152   None     Chief Complaint  Patient presents with  . Drug Overdose    (Consider location/radiation/quality/duration/timing/severity/associated sxs/prior treatment) HPI [name] is a 46 y.o. female who presents to the Emergency Department complaining of a drug overdose with an onset 7:30PM this evening. Pt states she doesn't know why she consumed 45-60 pills of Sanctura.Rx was filled June 9th but never took any of them due to a hospital admission 6/9-6/12/2011 at [facility] for  seizures and abnormal liver functions. No urine output since she took the pills.  No HA, fever, neck pain,  back pain, CP, SOB, abd pain, n/v/d, Patient admits she has tried to hurt herself in the past but states this was not a suicide attempt. No other pertinent medical symptoms.  PCP: Ignacia Bayley Neuro: [facility]  Past Medical History  Diagnosis Date  . Epilepsy   . Seizures   . Vertigo   . Constipation   . Restless leg     Past Surgical History  Procedure Date  . Total hip arthroplasty     Left Hip THR X 3  . Abdominal hysterectomy   . Colonoscopy   . Joint replacement     Family History  Problem Relation Age of Onset  . Cancer Mother   . Cancer Brother   . Diabetes Brother   . Cancer Maternal Aunt   . Diabetes Maternal Aunt     History  Substance Use Topics  . Smoking status: Never Smoker   . Smokeless tobacco: Never Used  . Alcohol Use: No    OB History    Grav Para Term Preterm Abortions TAB SAB Ect Mult Living                  Review of Systems 10 Systems reviewed and all are negative for acute change except as noted in the HPI.   Allergies  Benadryl; Codeine; Dilantin; Melatonin; Ultram; Vicodin; Latex; and Penicillins  Home  Medications   Current Outpatient Rx  Name Route Sig Dispense Refill  . CLONAZEPAM 0.5 MG PO TABS Oral Take 0.5 mg by mouth 3 (three) times daily as needed. For anxiety    . GABAPENTIN 600 MG PO TABS Oral Take 600 mg by mouth 2 (two) times daily.    . IBUPROFEN 800 MG PO TABS Oral Take 800 mg by mouth every 8 (eight) hours as needed. For pain    . LACOSAMIDE 100 MG PO TABS Oral Take 1 tablet (100 mg total) by mouth 2 (two) times daily. 60 tablet 0  . PAROXETINE HCL 20 MG PO TABS Oral Take 40 mg by mouth at bedtime.    . TROSPIUM CHLORIDE 20 MG PO TABS Oral Take 20 mg by mouth daily.      BP 115/79  Pulse 110  Temp 99 F (37.2 C) (Oral)  Resp 24  Ht 5' (1.524 m)  Wt 170 lb (77.111 kg)  BMI 33.20 kg/m2  SpO2 96%  Physical Exam  Nursing note and vitals reviewed. Constitutional: She is oriented to person, place, and time. She appears well-developed and well-nourished. No distress.  HENT:  Head: Normocephalic and atraumatic.  Right Ear: External ear normal.  Left Ear: External ear normal.  Mouth/Throat: Mucous  membranes are dry.  Eyes: EOM are normal.  Neck: Normal range of motion. Neck supple. No tracheal deviation present.  Cardiovascular: Normal rate.   Pulmonary/Chest: Effort normal. No respiratory distress.  Abdominal: There is no tenderness.  Musculoskeletal: Normal range of motion. She exhibits no edema and no tenderness.  Neurological: She is alert and oriented to person, place, and time.  Skin: Skin is warm and dry. No rash noted.  Psychiatric: She has a normal mood and affect. Her behavior is normal.    ED Course  Procedures (including critical care time)  DIAGNOSTIC STUDIES: Oxygen Saturation is 96% on room air, adequate by my interpretation.    COORDINATION OF CARE:  11:17PM - EDMD will order drug screen panel, UA, and blood w/u for the pt. Results for orders placed during the hospital encounter of 07/16/11  URINALYSIS, ROUTINE W REFLEX MICROSCOPIC       Component Value Range   Color, Urine YELLOW  YELLOW   APPearance CLEAR  CLEAR   Specific Gravity, Urine 1.015  1.005 - 1.030   pH 6.0  5.0 - 8.0   Glucose, UA NEGATIVE  NEGATIVE mg/dL   Hgb urine dipstick NEGATIVE  NEGATIVE   Bilirubin Urine NEGATIVE  NEGATIVE   Ketones, ur NEGATIVE  NEGATIVE mg/dL   Protein, ur NEGATIVE  NEGATIVE mg/dL   Urobilinogen, UA 0.2  0.0 - 1.0 mg/dL   Nitrite NEGATIVE  NEGATIVE   Leukocytes, UA NEGATIVE  NEGATIVE  URINE RAPID DRUG SCREEN (HOSP PERFORMED)      Component Value Range   Opiates NONE DETECTED  NONE DETECTED   Cocaine NONE DETECTED  NONE DETECTED   Benzodiazepines NONE DETECTED  NONE DETECTED   Amphetamines NONE DETECTED  NONE DETECTED   Tetrahydrocannabinol NONE DETECTED  NONE DETECTED   Barbiturates NONE DETECTED  NONE DETECTED  CBC WITH DIFFERENTIAL      Component Value Range   WBC 7.9  4.0 - 10.5 K/uL   RBC 3.99  3.87 - 5.11 MIL/uL   Hemoglobin 11.7 (*) 12.0 - 15.0 g/dL   HCT 45.4 (*) 09.8 - 11.9 %   MCV 86.2  78.0 - 100.0 fL   MCH 29.3  26.0 - 34.0 pg   MCHC 34.0  30.0 - 36.0 g/dL   RDW 14.7  82.9 - 56.2 %   Platelets 321  150 - 400 K/uL   Neutrophils Relative 50  43 - 77 %   Neutro Abs 3.9  1.7 - 7.7 K/uL   Lymphocytes Relative 38  12 - 46 %   Lymphs Abs 3.0  0.7 - 4.0 K/uL   Monocytes Relative 9  3 - 12 %   Monocytes Absolute 0.8  0.1 - 1.0 K/uL   Eosinophils Relative 2  0 - 5 %   Eosinophils Absolute 0.2  0.0 - 0.7 K/uL   Basophils Relative 1  0 - 1 %   Basophils Absolute 0.0  0.0 - 0.1 K/uL  COMPREHENSIVE METABOLIC PANEL      Component Value Range   Sodium 138  135 - 145 mEq/L   Potassium 3.2 (*) 3.5 - 5.1 mEq/L   Chloride 97  96 - 112 mEq/L   CO2 28  19 - 32 mEq/L   Glucose, Bld 101 (*) 70 - 99 mg/dL   BUN 13  6 - 23 mg/dL   Creatinine, Ser 1.30  0.50 - 1.10 mg/dL   Calcium 86.5  8.4 - 78.4 mg/dL   Total Protein 6.8  6.0 -  8.3 g/dL   Albumin 4.1  3.5 - 5.2 g/dL   AST 46 (*) 0 - 37 U/L   ALT 55 (*) 0 - 35 U/L    Alkaline Phosphatase 94  39 - 117 U/L   Total Bilirubin 0.3  0.3 - 1.2 mg/dL   GFR calc non Af Amer >90  >90 mL/min   GFR calc Af Amer >90  >90 mL/min  TROPONIN I      Component Value Range   Troponin I <0.30  <0.30 ng/mL      Date: 07/17/2011  2225  Rate: 101  Rhythm: sinus tachycardia  QRS Axis: normal  Intervals: normal  ST/T Wave abnormalities: normal  Conduction Disutrbances:none  Narrative Interpretation:   Old EKG Reviewed: unchanged c/w 12/29/2010  0020 T/C to Dr. Rito Ehrlich, hospitalist, case discussed, including:  HPI, pertinent PM/SHx, VS/PE, dx testing, ED course and treatment.  Agreeable to admission.  Requests to write temporary orders, step down bed to team 1.   MDM  Patient took somewhere between 73 and 60, 20 mg Sanctura at 1930 today. Will require 24 hour monitoring for arrhythmias and side effects of urinary retention, blurred vision, nausea, vomiting, constipation.Pt stable in ED with no significant deterioration in condition.The patient appears reasonably stabilized for admission considering the current resources, flow, and capabilities available in the ED at this time, and I doubt any other [facility] requiring further screening and/or treatment in the ED prior to admission.   I personally performed the services described in this documentation, which was scribed in my presence. The recorded information has been reviewed and considered.   MDM Reviewed: nursing note and vitals Interpretation: labs and ECG         Nicoletta Dress. Colon Branch, MD 07/17/11 [phone]

## 2011-07-17 ENCOUNTER — Encounter (HOSPITAL_COMMUNITY): Payer: Self-pay | Admitting: Internal Medicine

## 2011-07-17 DIAGNOSIS — F329 Major depressive disorder, single episode, unspecified: Secondary | ICD-10-CM

## 2011-07-17 DIAGNOSIS — G40909 Epilepsy, unspecified, not intractable, without status epilepticus: Secondary | ICD-10-CM

## 2011-07-17 DIAGNOSIS — T50911A Poisoning by multiple unspecified drugs, medicaments and biological substances, accidental (unintentional), initial encounter: Secondary | ICD-10-CM | POA: Diagnosis present

## 2011-07-17 DIAGNOSIS — T50901A Poisoning by unspecified drugs, medicaments and biological substances, accidental (unintentional), initial encounter: Secondary | ICD-10-CM

## 2011-07-17 DIAGNOSIS — R7989 Other specified abnormal findings of blood chemistry: Secondary | ICD-10-CM

## 2011-07-17 DIAGNOSIS — E876 Hypokalemia: Secondary | ICD-10-CM

## 2011-07-17 DIAGNOSIS — R339 Retention of urine, unspecified: Secondary | ICD-10-CM

## 2011-07-17 DIAGNOSIS — F32A Depression, unspecified: Secondary | ICD-10-CM | POA: Diagnosis present

## 2011-07-17 DIAGNOSIS — F3289 Other specified depressive episodes: Secondary | ICD-10-CM

## 2011-07-17 LAB — COMPREHENSIVE METABOLIC PANEL
ALT: 55 U/L — ABNORMAL HIGH (ref 0–35)
ALT: 51 U/L — ABNORMAL HIGH (ref 0–35)
AST: 46 U/L — ABNORMAL HIGH (ref 0–37)
AST: 44 U/L — ABNORMAL HIGH (ref 0–37)
Albumin: 4.1 g/dL (ref 3.5–5.2)
Albumin: 3.5 g/dL (ref 3.5–5.2)
Alkaline Phosphatase: 94 U/L (ref 39–117)
Alkaline Phosphatase: 84 U/L (ref 39–117)
BUN: 13 mg/dL (ref 6–23)
BUN: 11 mg/dL (ref 6–23)
CO2: 28 mEq/L (ref 19–32)
CO2: 27 mEq/L (ref 19–32)
Calcium: 10.3 mg/dL (ref 8.4–10.5)
Calcium: 9.2 mg/dL (ref 8.4–10.5)
Chloride: 97 mEq/L (ref 96–112)
Chloride: 100 mEq/L (ref 96–112)
Creatinine, Ser: 0.72 mg/dL (ref 0.50–1.10)
Creatinine, Ser: 0.7 mg/dL (ref 0.50–1.10)
GFR calc Af Amer: >90 mL/min (ref >90)
GFR calc Af Amer: >90 mL/min (ref >90)
GFR calc non Af Amer: >90 mL/min (ref >90)
GFR calc non Af Amer: >90 mL/min (ref >90)
Glucose, Bld: 101 mg/dL — ABNORMAL HIGH (ref 70–99)
Glucose, Bld: 89 mg/dL (ref 70–99)
Potassium: 3.2 mEq/L — ABNORMAL LOW (ref 3.5–5.1)
Potassium: 3.9 mEq/L (ref 3.5–5.1)
Sodium: 138 mEq/L (ref 135–145)
Sodium: 138 mEq/L (ref 135–145)
Total Bilirubin: 0.3 mg/dL (ref 0.3–1.2)
Total Bilirubin: 0.4 mg/dL (ref 0.3–1.2)
Total Protein: 6.8 g/dL (ref 6.0–8.3)
Total Protein: 6.1 g/dL (ref 6.0–8.3)

## 2011-07-17 LAB — CBC WITH DIFFERENTIAL/PLATELET
Basophils Absolute: 0 10*3/uL (ref 0.0–0.1)
Basophils Relative: 1 % (ref 0–1)
Eosinophils Absolute: 0.2 10*3/uL (ref 0.0–0.7)
Eosinophils Relative: 2 % (ref 0–5)
HCT: 34.4 % — ABNORMAL LOW (ref 36.0–46.0)
Hemoglobin: 11.7 g/dL — ABNORMAL LOW (ref 12.0–15.0)
Lymphocytes Relative: 38 % (ref 12–46)
Lymphs Abs: 3 10*3/uL (ref 0.7–4.0)
MCH: 29.3 pg (ref 26.0–34.0)
MCHC: 34 g/dL (ref 30.0–36.0)
MCV: 86.2 fL (ref 78.0–100.0)
Monocytes Absolute: 0.8 10*3/uL (ref 0.1–1.0)
Monocytes Relative: 9 % (ref 3–12)
Neutro Abs: 3.9 10*3/uL (ref 1.7–7.7)
Neutrophils Relative %: 50 % (ref 43–77)
Platelets: 321 10*3/uL (ref 150–400)
RBC: 3.99 MIL/uL (ref 3.87–5.11)
RDW: 13.7 % (ref 11.5–15.5)
WBC: 7.9 10*3/uL (ref 4.0–10.5)

## 2011-07-17 LAB — CBC
HCT: 32.8 % — ABNORMAL LOW (ref 36.0–46.0)
Hemoglobin: 10.9 g/dL — ABNORMAL LOW (ref 12.0–15.0)
MCH: 29.4 pg (ref 26.0–34.0)
MCHC: 33.2 g/dL (ref 30.0–36.0)
MCV: 88.4 fL (ref 78.0–100.0)
Platelets: 281 10*3/uL (ref 150–400)
RBC: 3.71 MIL/uL — ABNORMAL LOW (ref 3.87–5.11)
RDW: 13.8 % (ref 11.5–15.5)
WBC: 6.7 10*3/uL (ref 4.0–10.5)

## 2011-07-17 LAB — ACETAMINOPHEN LEVEL: Acetaminophen (Tylenol), Serum: <15 ug/mL (ref 10–30)

## 2011-07-17 LAB — TROPONIN I: Troponin I: <0.3 ng/mL (ref <0.30)

## 2011-07-17 LAB — MRSA PCR SCREENING: MRSA by PCR: NEGATIVE

## 2011-07-17 LAB — SALICYLATE LEVEL: Salicylate Lvl: 0 mg/dL — ABNORMAL LOW (ref 2.8–20.0)

## 2011-07-17 MED ORDER — ACETAMINOPHEN 650 MG RE SUPP: 650.0000 mg | Freq: Four times a day (QID) | RECTAL | Status: DC | PRN

## 2011-07-17 MED ORDER — ONDANSETRON HCL 4 MG PO TABS: 4.0000 mg | ORAL_TABLET | Freq: Four times a day (QID) | ORAL | Status: DC | PRN

## 2011-07-17 MED ORDER — PAROXETINE HCL 20 MG PO TABS
40.0000 mg | ORAL_TABLET | Freq: Every day | ORAL | Status: DC
  Administered 2011-07-17: 40 mg via ORAL
  Filled 2011-07-17: qty 2

## 2011-07-17 MED ORDER — POTASSIUM CHLORIDE IN NACL 20-0.9 MEQ/L-% IV SOLN
INTRAVENOUS | Status: DC
  Administered 2011-07-17 – 2011-07-18 (×3): via INTRAVENOUS

## 2011-07-17 MED ORDER — POTASSIUM CHLORIDE 10 MEQ/100ML IV SOLN
INTRAVENOUS | Status: AC
  Filled 2011-07-17: qty 400

## 2011-07-17 MED ORDER — GABAPENTIN 300 MG PO CAPS
600.0000 mg | ORAL_CAPSULE | Freq: Four times a day (QID) | ORAL | Status: DC
  Administered 2011-07-17 – 2011-07-18 (×7): 600 mg via ORAL
  Filled 2011-07-17 (×7): qty 2

## 2011-07-17 MED ORDER — LACOSAMIDE 50 MG PO TABS
100.0000 mg | ORAL_TABLET | Freq: Two times a day (BID) | ORAL | Status: DC
  Administered 2011-07-17 – 2011-07-18 (×3): 100 mg via ORAL
  Filled 2011-07-17 (×2): qty 2
  Filled 2011-07-17: qty 1
  Filled 2011-07-17 (×3): qty 2

## 2011-07-17 MED ORDER — SODIUM CHLORIDE 0.9 % IJ SOLN
INTRAMUSCULAR | Status: AC
  Administered 2011-07-17: 02:00:00
  Filled 2011-07-17: qty 3

## 2011-07-17 MED ORDER — POTASSIUM CHLORIDE 10 MEQ/100ML IV SOLN
10.0000 meq | INTRAVENOUS | Status: AC
  Administered 2011-07-17 (×4): 10 meq via INTRAVENOUS

## 2011-07-17 MED ORDER — ONDANSETRON HCL 4 MG/2ML IJ SOLN: 4.0000 mg | Freq: Four times a day (QID) | INTRAMUSCULAR | Status: DC | PRN

## 2011-07-17 MED ORDER — ACETAMINOPHEN 325 MG PO TABS: 650.0000 mg | ORAL_TABLET | Freq: Four times a day (QID) | ORAL | Status: DC | PRN

## 2011-07-17 MED ORDER — SODIUM CHLORIDE 0.9 % IV SOLN
INTRAVENOUS | Status: AC
  Administered 2011-07-17: 02:00:00 via INTRAVENOUS

## 2011-07-17 MED ORDER — SODIUM CHLORIDE 0.9 % IV SOLN: INTRAVENOUS | Status: DC

## 2011-07-17 MED ORDER — ALBUTEROL SULFATE (5 MG/ML) 0.5% IN NEBU: 2.5000 mg | INHALATION_SOLUTION | RESPIRATORY_TRACT | Status: DC | PRN

## 2011-07-17 MED ORDER — PANTOPRAZOLE SODIUM 40 MG IV SOLR
40.0000 mg | INTRAVENOUS | Status: DC
  Administered 2011-07-17 – 2011-07-18 (×2): 40 mg via INTRAVENOUS
  Filled 2011-07-17 (×2): qty 40

## 2011-07-17 MED ORDER — ALUM & MAG HYDROXIDE-SIMETH 200-200-20 MG/5ML PO SUSP
15.0000 mL | ORAL | Status: DC | PRN
  Administered 2011-07-17 – 2011-07-18 (×2): 15 mL via ORAL
  Filled 2011-07-17 (×2): qty 30

## 2011-07-17 NOTE — Clinical Social Work Note (Signed)
CSW consulted due to overdose.  Spoke w patient at bedside.  Patient states she was anxious about situation w son (25) who is currently in halfway house in [place] after being released from prison where he has been incarcerated after committing multiple felonies.  Took overdose to "get rid of the feelings" generated during phone call that evening.  Understands that taking overdoes did not change underlying situation.  Son has history of substance abuse and legal difficulties, which began when he was 64.  Patient states she "wishes she had been a better mother" and feels she should have done something to prevent his behaviors.  Patient states she gets upset when son calls and asks for money.  Patient also states that she has husband who is on disability and finishing treatment for leukemia.  Patient lives w husband in [place], [place] and has mother and sisters nearby.  Despite family nearby, patient says she feels very isolated.  Struggles w depression and anxiety, sees MD at [facility] for prescriptions but does not have a therapist.  Patient would like referral to therapist in [place] area, and states she is willing to get help w managing stress in her life.  Discussed alternative ways she could respond to son and need to take care of self.  Clovis Cao Clinical Social Worker [phone])

## 2011-07-17 NOTE — H&P (Signed)
[name] is an 46 y.o. female.    PCP: Rudi Heap, MD  Her neurologist is [name]  Chief Complaint: Took pills  HPI: This is a 46 year old, Caucasian female, with a past with history of seizure disorder, urinary retention, depression, arthritis who was in her usual state of health till last evening when she apparently took about 45 tablets of trospium, 3 tablets of Klonopin and one tablet of Paxil. These numbers have not been corroborated. But her husband thinks that at least the information regarding Klonopin and Paxil are accurate, but he doesn't know about the Trospium. When asked why she took some pills patient does not answer clearly. She denies any pain at this time. She denies any suicidal ideation, but she is somewhat sedated. According to the husband the patient remains upset most of the time. There was no incident in her personal life recently. She was hospitalized back in June for seizures, which have since been controlled. So history is limited at this time because the patient is drowsy.   Home Medications: Prior to Admission medications   Medication Sig Start Date End Date Taking? Authorizing Provider  clonazePAM (KLONOPIN) 0.5 MG tablet Take 0.5 mg by mouth 3 (three) times daily as needed. For anxiety   Yes Historical Provider, MD  gabapentin (NEURONTIN) 600 MG tablet Take 600 mg by mouth 2 (two) times daily. 05/28/11  Yes Belkys A Regalado, MD  ibuprofen (ADVIL,MOTRIN) 800 MG tablet Take 800 mg by mouth every 8 (eight) hours as needed. For pain   Yes Historical Provider, MD  lacosamide 100 MG TABS Take 1 tablet (100 mg total) by mouth 2 (two) times daily. 06/25/11  Yes Elease Etienne, MD  PARoxetine (PAXIL) 20 MG tablet Take 40 mg by mouth at bedtime. 03/30/11 03/29/12 Yes Vassie Loll, MD  trospium (SANCTURA) 20 MG tablet Take 20 mg by mouth daily.   Yes Historical Provider, MD    Allergies:  Allergies  Allergen Reactions  . Benadryl (Diphenhydramine Hcl) Other  (See Comments)    seizures  . Codeine     rash  . Dilantin (Phenytoin Sodium Extended)     Elevated LFT's  . Melatonin Other (See Comments)    Not sure  . Ultram (Tramadol Hcl) Other (See Comments)    Reaction:Seizures  . Vicodin (Hydrocodone-Acetaminophen) Itching  . Latex Rash  . Penicillins Itching and Rash    Past Medical History: Past Medical History  Diagnosis Date  . Epilepsy   . Seizures   . Vertigo   . Constipation   . Restless leg     Past Surgical History  Procedure Date  . Total hip arthroplasty     Left Hip THR X 3  . Abdominal hysterectomy   . Colonoscopy   . Joint replacement     Social History:  reports that she has never smoked. She has never used smokeless tobacco. She reports that she does not drink alcohol or use illicit drugs.  Family History:  Family History  Problem Relation Age of Onset  . Cancer Mother   . Cancer Brother   . Diabetes Brother   . Cancer Maternal Aunt   . Diabetes Maternal Aunt     Review of Systems - unobtainable from patient due to mental status  Physical Examination Blood pressure 115/79, pulse 110, temperature 99 F (37.2 C), temperature source Oral, resp. rate 24, height 5' (1.524 m), weight 77.111 kg (170 lb), SpO2 96.00%.  General appearance: alert, cooperative, appears  stated age and no distress Head: Normocephalic, without obvious abnormality, atraumatic Eyes: conjunctivae/corneas clear. PERRL, EOM's intact.  Throat: lips, mucosa, and tongue normal; teeth and gums normal Neck: no adenopathy, no carotid bruit, no JVD, supple, symmetrical, trachea midline and thyroid not enlarged, symmetric, no tenderness/mass/nodules Resp: clear to auscultation bilaterally Cardio: regular rate and rhythm, S1, S2 normal, no murmur, click, rub or gallop GI: soft, non-tender; bowel sounds normal; no masses,  no organomegaly Extremities: extremities normal, atraumatic, no cyanosis or edema Pulses: 2+ and symmetric Skin: Skin  color, texture, turgor normal. No rashes or lesions Lymph nodes: Cervical, supraclavicular, and axillary nodes normal. Neurologic: She is drowsy, and asleep. But she is easily arousable. Follows commands. No cranial nerve deficits. Motor strength was equal bilaterally.  Laboratory Data: Results for orders placed during the hospital encounter of 07/16/11 (from the past 48 hour(s))  URINALYSIS, ROUTINE W REFLEX MICROSCOPIC     Status: Normal   Collection Time   07/16/11 11:03 PM      Component Value Range Comment   Color, Urine YELLOW  YELLOW    APPearance CLEAR  CLEAR    Specific Gravity, Urine 1.015  1.005 - 1.030    pH 6.0  5.0 - 8.0    Glucose, UA NEGATIVE  NEGATIVE mg/dL    Hgb urine dipstick NEGATIVE  NEGATIVE    Bilirubin Urine NEGATIVE  NEGATIVE    Ketones, ur NEGATIVE  NEGATIVE mg/dL    Protein, ur NEGATIVE  NEGATIVE mg/dL    Urobilinogen, UA 0.2  0.0 - 1.0 mg/dL    Nitrite NEGATIVE  NEGATIVE    Leukocytes, UA NEGATIVE  NEGATIVE MICROSCOPIC NOT DONE ON URINES WITH NEGATIVE PROTEIN, BLOOD, LEUKOCYTES, NITRITE, OR GLUCOSE <1000 mg/dL.  URINE RAPID DRUG SCREEN (HOSP PERFORMED)     Status: Normal   Collection Time   07/16/11 11:03 PM      Component Value Range Comment   Opiates NONE DETECTED  NONE DETECTED    Cocaine NONE DETECTED  NONE DETECTED    Benzodiazepines NONE DETECTED  NONE DETECTED    Amphetamines NONE DETECTED  NONE DETECTED    Tetrahydrocannabinol NONE DETECTED  NONE DETECTED    Barbiturates NONE DETECTED  NONE DETECTED   CBC WITH DIFFERENTIAL     Status: Abnormal   Collection Time   07/16/11 11:18 PM      Component Value Range Comment   WBC 7.9  4.0 - 10.5 K/uL    RBC 3.99  3.87 - 5.11 MIL/uL    Hemoglobin 11.7 (*) 12.0 - 15.0 g/dL    HCT 16.1 (*) 09.6 - 46.0 %    MCV 86.2  78.0 - 100.0 fL    MCH 29.3  26.0 - 34.0 pg    MCHC 34.0  30.0 - 36.0 g/dL    RDW 04.5  40.9 - 81.1 %    Platelets 321  150 - 400 K/uL    Neutrophils Relative 50  43 - 77 %    Neutro Abs  3.9  1.7 - 7.7 K/uL    Lymphocytes Relative 38  12 - 46 %    Lymphs Abs 3.0  0.7 - 4.0 K/uL    Monocytes Relative 9  3 - 12 %    Monocytes Absolute 0.8  0.1 - 1.0 K/uL    Eosinophils Relative 2  0 - 5 %    Eosinophils Absolute 0.2  0.0 - 0.7 K/uL    Basophils Relative 1  0 - 1 %  Basophils Absolute 0.0  0.0 - 0.1 K/uL   COMPREHENSIVE METABOLIC PANEL     Status: Abnormal   Collection Time   07/16/11 11:18 PM      Component Value Range Comment   Sodium 138  135 - 145 mEq/L    Potassium 3.2 (*) 3.5 - 5.1 mEq/L    Chloride 97  96 - 112 mEq/L    CO2 28  19 - 32 mEq/L    Glucose, Bld 101 (*) 70 - 99 mg/dL    BUN 13  6 - 23 mg/dL    Creatinine, Ser 4.54  0.50 - 1.10 mg/dL    Calcium 09.8  8.4 - 10.5 mg/dL    Total Protein 6.8  6.0 - 8.3 g/dL    Albumin 4.1  3.5 - 5.2 g/dL    AST 46 (*) 0 - 37 U/L    ALT 55 (*) 0 - 35 U/L    Alkaline Phosphatase 94  39 - 117 U/L    Total Bilirubin 0.3  0.3 - 1.2 mg/dL    GFR calc non Af Amer >90  >90 mL/min    GFR calc Af Amer >90  >90 mL/min   TROPONIN I     Status: Normal   Collection Time   07/16/11 11:18 PM      Component Value Range Comment   Troponin I <0.30  <0.30 ng/mL     Radiology Reports: No results found.  Electrocardiogram: Sinus tachycardia at 101 beats per minute. Normal axis. Intervals appear to be normal. Q wave noted in lead 3. No concerning ST or T-wave changes are noted. The Q wave wasn't very evident in previous EKGs.  Assessment/Plan  Principal Problem:  *Drug overdose, multiple drugs Active Problems:  Anemia  Urinary retention  Seizure disorder  Depression   #1 drug overdose with multiple drugs including prostate and, Klonopin: The Paxil that she took was her usual dose. There does not seem to be any overdose with that drug. The reason for her drug overdose is not entirely clear. She does have a significant history of depression and anxiety. This probably was an attempt to hurt herself. She'll be admitted to the step  down unit. Trospium can cause anticholinergic side effects. She'll be monitored on cardiac monitor. Blood pressures be monitored closely. Supportive treatment will be provided. Act team will be involved in the morning. Sitter will be utilized. I have discussed the above with poison control as well.  #2 hypokalemia. Will be repleted intravenously.  #3 history of urinary retention: She did have a foley catheter in which was removed about 3 weeks ago. She was seen by urology last week. According to the husband she still continues to have some urinary problems. We will place a Foley if needed.  #4 history of depression and anxiety: Please see above.  #5 history of seizure disorder: Continue with Vimpat as recently recommended by neurology.  #6 abnormal EKG: Probably related to the drug overdose. Troponin is negative. Continue to monitor.  #7 elevated LFTs: She does have a history of the same. It was thought to be related to medications. We'll repeat the levels in the morning.  She is a full code. DVT, prophylaxis will be utilized.  Further management decisions will depend on results of further testing and patient's response to treatment.  [facility]  [facility] Pager [phone]  07/17/2011, 12:51 AM

## 2011-07-17 NOTE — Progress Notes (Signed)
Report called to [facility] on depratment 300.  Pt ready for transfer to room 320 via w/c.  Sitter with patient and will remain at bedside.  No acute distress noted.

## 2011-07-17 NOTE — Progress Notes (Signed)
Called MD about patient not voiding, order to obtain bladder scan and if greater than 300 then place foley catheter; bladder scan showed ; foley catheter was ordered and placed at this time.

## 2011-07-17 NOTE — Clinical Social Work Psychosocial (Signed)
    Clinical Social Work Department BRIEF PSYCHOSOCIAL ASSESSMENT 07/17/2011  Patient:  [name], [name]     Account Number:  [id number]     Admit date:  07/16/2011  Clinical Social Worker:  Santa Genera, CLINICAL SOCIAL WORKER  Date/Time:  07/17/2011 10:00 AM  Referred by:  Physician  Date Referred:  07/17/2011 Referred for  Other - See comment   Other Referral:   Interview type:  Patient Other interview type:    PSYCHOSOCIAL DATA Living Status:  FAMILY Admitted from facility:   Level of care:   Primary support name:  Emilyrose Darrah Primary support relationship to patient:  SPOUSE Degree of support available:   Some support from familiy, but patient states she feels isolated.    CURRENT CONCERNS Current Concerns  Behavioral Health Issues   Other Concerns:    SOCIAL WORK ASSESSMENT / PLAN Spoke w patient at bedside.  Patient took overdose of prescription medications last night after phone call from son in halfway house.  Son has long history of legal involvement and substance use issues.  Depends on mother to "solve his problems" and makes demands of her.  Patient also has husband recovering from leukemia.  Patient has epilepsy and cannot drive, is somewhat isolated.  Has psychiatrist at [facility] but not therapist.  Patient needs help dealing w significant history of depression, family stress, managing anxiety.  States taking pills last night was reaction to bad feelings that came after phone call w son.  Understands that taking overdose did not solve the situation, needs to find other ways of coping w stress.   Assessment/plan status:  Psychosocial Support/Ongoing Assessment of Needs Other assessment/ plan:   Information/referral to community resources:   Refer to community therapist, [employer] or Lona Millard.    PATIENT'S/FAMILY'S RESPONSE TO PLAN OF CARE: Patient appreciative.    Clovis Cao Clinical Social Worker  [phone])

## 2011-07-17 NOTE — BH Assessment (Signed)
Assessment Note   [name] is an 46 y.o. female. Patient has been moved from ICU to medical floor. Patient has a history of over-taking her medications. She has apparently done this type of psuedo-OD per nursing staff who have worked with her in the past. Patient states she wasn't trying to harm herself, yet she admits that she is having difficulty coping and lacks the appropriate supports to handle the current issues she is experiencing with family and overall life circumstances. She admits to feeling some loss of control and decrease in her ability to control her impulses. She is experiencing increased depressive symptoms. She is not experiencing any hallucinations or delusions at this time. She denies substance abuse. She does not admit to having any family with a history of behavioral health issues or suicide. There are guns in the home but she states there are no bullets.  She is willing to voluntarily admit herself to an inpatient facility once she is medically clear. She is also willing to begin seeing an outpatient therapist in the community, along with her current psychiatriatric/medication management treatment protocol.   Axis I: Major Depression, Recurrent severe Axis II;Deferred Axis III Epilepsy, Restless Leg, Vertigo Axis IV: Poor coping mechanisms, poor compliance with follow up services; relationship issues impacting her ability to control impulses Axis V: 33  Past Medical History:  Past Medical History  Diagnosis Date  . Epilepsy   . Seizures   . Vertigo   . Constipation   . Restless leg     Past Surgical History  Procedure Date  . Total hip arthroplasty     Left Hip THR X 3  . Abdominal hysterectomy   . Colonoscopy   . Joint replacement     Family History:  Family History  Problem Relation Age of Onset  . Cancer Mother   . Cancer Brother   . Diabetes Brother   . Cancer Maternal Aunt   . Diabetes Maternal Aunt     Social History:  reports that she has  never smoked. She has never used smokeless tobacco. She reports that she does not drink alcohol or use illicit drugs.  Additional Social History:     CIWA: CIWA-Ar BP: 107/75 mmHg Pulse Rate: 89  COWS:    Allergies:  Allergies  Allergen Reactions  . Benadryl (Diphenhydramine Hcl) Other (See Comments)    seizures  . Codeine     rash  . Dilantin (Phenytoin Sodium Extended)     Elevated LFT's  . Melatonin Other (See Comments)    Not sure  . Ultram (Tramadol Hcl) Other (See Comments)    Reaction:Seizures  . Vicodin (Hydrocodone-Acetaminophen) Itching  . Latex Rash  . Penicillins Itching and Rash    Home Medications:  Medications Prior to Admission  Medication Sig Dispense Refill  . clonazePAM (KLONOPIN) 0.5 MG tablet Take 0.5 mg by mouth 3 (three) times daily as needed. For anxiety      . gabapentin (NEURONTIN) 600 MG tablet Take 600 mg by mouth 2 (two) times daily.      Marland Kitchen ibuprofen (ADVIL,MOTRIN) 800 MG tablet Take 800 mg by mouth every 8 (eight) hours as needed. For pain      . lacosamide 100 MG TABS Take 1 tablet (100 mg total) by mouth 2 (two) times daily.  60 tablet  0  . PARoxetine (PAXIL) 20 MG tablet Take 40 mg by mouth at bedtime.      . trospium (SANCTURA) 20 MG tablet Take 20 mg  by mouth daily.        OB/GYN Status:  No LMP recorded. Patient has had a hysterectomy.  General Assessment Data Location of Assessment: [facility] (Medical Floor) ACT Assessment: Yes Living Arrangements: Spouse/significant other Can pt return to current living arrangement?: Yes Admission Status: Voluntary Is patient capable of signing voluntary admission?: Yes Transfer from: Acute Hospital Referral Source: Medical Floor Inpatient  Education Status Is patient currently in school?: No  Risk to self Suicidal Ideation: No-Not Currently/Within Last 6 Months Suicidal Intent: No Is patient at risk for suicide?: Yes Suicidal Plan?: No Access to Means: Yes (pills; guns in the house-no  bullets) Specify Access to Suicidal Means:  (pills in the house) What has been your use of drugs/alcohol within the last 12 months?:  (denies) Previous Attempts/Gestures: Yes How many times?:  (unknown) Triggers for Past Attempts: Family contact Intentional Self Injurious Behavior: None Family Suicide History: No Recent stressful life event(s): Conflict (Comment) (having issues with son) Persecutory voices/beliefs?: Yes (conflict with son) Depression: Yes Depression Symptoms: Isolating;Guilt;Loss of interest in usual pleasures;Feeling worthless/self pity Substance abuse history and/or treatment for substance abuse?: No (denies) Suicide prevention information given to non-admitted patients: Not applicable  Risk to Others Homicidal Ideation: No Thoughts of Harm to Others: No Current Homicidal Intent: No Current Homicidal Plan: No Access to Homicidal Means: No History of harm to others?: No Assessment of Violence: None Noted Violent Behavior Description:  (none noted) Does patient have access to weapons?: Yes (Comment) (guns in the house, no bullets) Criminal Charges Pending?: No Does patient have a court date: No  Psychosis Hallucinations: None noted Delusions: None noted  Mental Status Report Appear/Hygiene: Disheveled Eye Contact: Poor Motor Activity: Psychomotor retardation Speech: Pressured Level of Consciousness: Sleeping;Drowsy Mood: Ambivalent Affect: Depressed Anxiety Level: Minimal Thought Processes: Relevant Judgement: Unimpaired Orientation: Person;Place;Situation;Appropriate for developmental age  Cognitive Functioning Concentration: Decreased Memory: Recent Intact IQ: Average Insight: Poor Impulse Control: Poor Appetite: Fair Sleep: Decreased  ADLScreening [facility] Assessment Services) Patient's cognitive ability adequate to safely complete daily activities?: Yes Patient able to express need for assistance with ADLs?: Yes Independently performs ADLs?:  Yes  Abuse/Neglect [facility]) Physical Abuse: Denies Verbal Abuse: Denies Sexual Abuse: Denies  Prior Inpatient Therapy Prior Inpatient Therapy: Yes Prior Therapy Facilty/Provider(s):  [facility]) Reason for Treatment:  (depression, od)  Prior Outpatient Therapy Prior Outpatient Therapy: Yes Prior Therapy Dates:  (current) Prior Therapy Facilty/Provider(s):  ([facility] management only) Reason for Treatment:  (depression)  ADL Screening (condition at time of admission) Patient's cognitive ability adequate to safely complete daily activities?: Yes Patient able to express need for assistance with ADLs?: Yes Independently performs ADLs?: Yes Weakness of Legs: None Weakness of Arms/Hands: None  Home Assistive Devices/Equipment Home Assistive Devices/Equipment: None  Therapy Consults (therapy consults require a physician order) PT Evaluation Needed: No OT Evalulation Needed: No SLP Evaluation Needed: No Abuse/Neglect Assessment (Assessment to be complete while patient is alone) Physical Abuse: Denies Verbal Abuse: Denies Sexual Abuse: Denies Exploitation of patient/patient's resources: Denies Self-Neglect: Denies Values / Beliefs Cultural Requests During Hospitalization: None Spiritual Requests During Hospitalization: None Consults Spiritual Care Consult Needed: No Social Work Consult Needed: No Merchant navy officer (For Healthcare) Advance Directive: Patient does not have advance directive;Patient would not like information Pre-existing out of facility DNR order (yellow form or pink MOST form): No Nutrition Screen Diet: Clear liquid Unintentional weight loss greater than 10lbs within the last month: No Problems chewing or swallowing foods and/or liquids: No Home Tube Feeding or Total Parenteral  Nutrition (TPN): No Patient appears severely malnourished: No Pregnant or Lactating: No  Additional Information 1:1 In Past 12 Months?: No CIRT Risk: No Elopement Risk: No Does  patient have medical clearance?: No     Disposition:  Disposition Disposition of Patient: Inpatient treatment program Type of inpatient treatment program: Adult  Patient states at this time she is willing to voluntarily admit herself to inpatient treatment program, however she is not medically clear to initiate the referral.   On Site Evaluation by:  Dr. Kerry Hough Reviewed with Physician:  Dr. Kristen Cardinal, Maximiano Coss H 07/17/2011 12:02 PM

## 2011-07-17 NOTE — Progress Notes (Signed)
Patient was admitted by Dr. Rito Ehrlich earlier this morning  Patient seen and examined, database reviewed  Her mental status has improved and she is able to provide history  She admits to taking an overdose of sanctura (unaware to the number of pills).  She reports feeling depressed and may have been trying to hurt herself.   She was prescribed the sanctura for overactive bladder.  She was recently hospitalized last month and was found to be in urinary retention. Patient had a Foley catheter placed at that time and it was recommended that Sanctura be discontinued. She reports following up with urology for a voiding trial  and she was able to pass urine. She was told it was fine to restart [place]. Since her hospitalization it was noted that she had significant urinary retention. Foley catheter was placed again last night. Half-life the [place] is approximately 20 hours. We will leave the catheter in for now, can attempt a voiding trial tomorrow.  She does have mildly elevated liver function tests. We will repeat them tomorrow. She does not have any nausea vomiting or abdominal pain.  Patient will be kept on suicide precautions with a sitter. She will need ACT team evaluation. She is stable for transfer to the floor.

## 2011-07-18 DIAGNOSIS — R339 Retention of urine, unspecified: Secondary | ICD-10-CM

## 2011-07-18 LAB — COMPREHENSIVE METABOLIC PANEL
ALT: 54 U/L — ABNORMAL HIGH (ref 0–35)
AST: 46 U/L — ABNORMAL HIGH (ref 0–37)
Albumin: 3.4 g/dL — ABNORMAL LOW (ref 3.5–5.2)
Alkaline Phosphatase: 87 U/L (ref 39–117)
BUN: 6 mg/dL (ref 6–23)
CO2: 27 mEq/L (ref 19–32)
Calcium: 8.8 mg/dL (ref 8.4–10.5)
Chloride: 106 mEq/L (ref 96–112)
Creatinine, Ser: 0.65 mg/dL (ref 0.50–1.10)
GFR calc Af Amer: >90 mL/min (ref >90)
GFR calc non Af Amer: >90 mL/min (ref >90)
Glucose, Bld: 88 mg/dL (ref 70–99)
Potassium: 4.1 mEq/L (ref 3.5–5.1)
Sodium: 141 mEq/L (ref 135–145)
Total Bilirubin: 0.3 mg/dL (ref 0.3–1.2)
Total Protein: 6 g/dL (ref 6.0–8.3)

## 2011-07-18 MED ORDER — SODIUM CHLORIDE 0.9 % IJ SOLN
INTRAMUSCULAR | Status: AC
  Administered 2011-07-18: 10 mL
  Filled 2011-07-18: qty 3

## 2011-07-18 MED ORDER — SIMETHICONE 80 MG PO CHEW
160.0000 mg | CHEWABLE_TABLET | Freq: Four times a day (QID) | ORAL | Status: DC | PRN
  Administered 2011-07-18: 160 mg via ORAL
  Filled 2011-07-18: qty 2

## 2011-07-18 MED ORDER — SODIUM CHLORIDE 0.9 % IJ SOLN
INTRAMUSCULAR | Status: AC
  Administered 2011-07-18: 13:00:00
  Filled 2011-07-18: qty 3

## 2011-07-18 NOTE — Progress Notes (Signed)
Pt and husband provided discharge instructions and catheter care teaching.  Both verbalized understanding of instructions.

## 2011-07-18 NOTE — BH Assessment (Signed)
Assessment Note   [name] is an 46 y.o. female. (intial) Patient has been moved from ICU to medical floor. Patient has a history of over-taking her medications. She has apparently done this type of psuedo-OD per nursing staff who have worked with her in the past. Patient states she wasn't trying to harm herself, yet she admits that she is having difficulty coping and lacks the appropriate supports to handle the current issues she is experiencing with family and overall life circumstances. She admits to feeling some loss of control and decrease in her ability to control her impulses. She is experiencing increased depressive symptoms. She is not experiencing any hallucinations or delusions at this time. She denies substance abuse. She does not admit to having any family with a history of behavioral health issues or suicide. There are guns in the home but she states there are no bullets.  She is willing to voluntarily admit herself to an inpatient facility once she is medically clear. She is also willing to begin seeing an outpatient therapist in the community, along with her current psychiatriatric/medication management treatment protocol.   Reassessment 07/18/11: pt denies having depressive symptoms & willing to still be admitted but says she would like to go home. Pt states the meds she is on tend to cause increase depression & suicidal thoughts. Pt was ran by [facility] who has been declined by Wandra Scot, NP & Dr. Allena Katz had declined due to medical clearance. Per spouse, Pt is dependent on Pain meds & spouse wants pt to be "Dried out". Pt does not believe she has a problem. Clinician informed Nurse of Pt's disposition & recommendation to have a Telepsych done. Telepsych pending at this moment.    Axis I: Major Depression, Recurrent severe Axis II;Deferred Axis III Epilepsy, Restless Leg, Vertigo Axis IV: Poor coping mechanisms, poor compliance with follow up services; relationship issues  impacting her ability to control impulses Axis V: 33  Past Medical History:  Past Medical History  Diagnosis Date  . Epilepsy   . Seizures   . Vertigo   . Constipation   . Restless leg     Past Surgical History  Procedure Date  . Total hip arthroplasty     Left Hip THR X 3  . Abdominal hysterectomy   . Colonoscopy   . Joint replacement     Family History:  Family History  Problem Relation Age of Onset  . Cancer Mother   . Cancer Brother   . Diabetes Brother   . Cancer Maternal Aunt   . Diabetes Maternal Aunt     Social History:  reports that she has never smoked. She has never used smokeless tobacco. She reports that she does not drink alcohol or use illicit drugs.  Additional Social History:     CIWA: CIWA-Ar BP: 113/79 mmHg Pulse Rate: 85  COWS:    Allergies:  Allergies  Allergen Reactions  . Benadryl (Diphenhydramine Hcl) Other (See Comments)    seizures  . Codeine     rash  . Dilantin (Phenytoin Sodium Extended)     Elevated LFT's  . Melatonin Other (See Comments)    Not sure  . Ultram (Tramadol Hcl) Other (See Comments)    Reaction:Seizures  . Vicodin (Hydrocodone-Acetaminophen) Itching  . Latex Rash  . Penicillins Itching and Rash    Home Medications:  Medications Prior to Admission  Medication Sig Dispense Refill  . clonazePAM (KLONOPIN) 0.5 MG tablet Take 0.5 mg by mouth 3 (three)  times daily as needed. For anxiety      . gabapentin (NEURONTIN) 600 MG tablet Take 600 mg by mouth 2 (two) times daily.      Marland Kitchen ibuprofen (ADVIL,MOTRIN) 800 MG tablet Take 800 mg by mouth every 8 (eight) hours as needed. For pain      . lacosamide 100 MG TABS Take 1 tablet (100 mg total) by mouth 2 (two) times daily.  60 tablet  0  . PARoxetine (PAXIL) 20 MG tablet Take 40 mg by mouth at bedtime.      . trospium (SANCTURA) 20 MG tablet Take 20 mg by mouth daily.        OB/GYN Status:  No LMP recorded. Patient has had a hysterectomy.  General Assessment  Data Location of Assessment: [facility] ED (Medical Floor) ACT Assessment: Yes Living Arrangements: Spouse/significant other Can pt return to current living arrangement?: Yes Admission Status: Voluntary Is patient capable of signing voluntary admission?: Yes Transfer from: Acute Hospital Referral Source: Medical Floor Inpatient  Education Status Is patient currently in school?: No  Risk to self Suicidal Ideation: No-Not Currently/Within Last 6 Months Suicidal Intent: No Is patient at risk for suicide?: Yes Suicidal Plan?: No Access to Means: Yes (pills; guns in the house-no bullets) Specify Access to Suicidal Means:  (pills in the house) What has been your use of drugs/alcohol within the last 12 months?:  (denies) Previous Attempts/Gestures: Yes How many times?:  (unknown) Triggers for Past Attempts: Family contact Intentional Self Injurious Behavior: None Family Suicide History: No Recent stressful life event(s): Conflict (Comment) (having issues with son) Persecutory voices/beliefs?: Yes (conflict with son) Depression: Yes Depression Symptoms: Isolating;Guilt;Loss of interest in usual pleasures;Feeling worthless/self pity Substance abuse history and/or treatment for substance abuse?: No (denies) Suicide prevention information given to non-admitted patients: Not applicable  Risk to Others Homicidal Ideation: No Thoughts of Harm to Others: No Current Homicidal Intent: No Current Homicidal Plan: No Access to Homicidal Means: No History of harm to others?: No Assessment of Violence: None Noted Violent Behavior Description:  (none noted) Does patient have access to weapons?: Yes (Comment) (guns in the house, no bullets) Criminal Charges Pending?: No Does patient have a court date: No  Psychosis Hallucinations: None noted Delusions: None noted  Mental Status Report Appear/Hygiene: Disheveled Eye Contact: Poor Motor Activity: Psychomotor retardation Speech: Pressured Level  of Consciousness: Sleeping;Drowsy Mood: Ambivalent Affect: Depressed Anxiety Level: Minimal Thought Processes: Relevant Judgement: Unimpaired Orientation: Person;Place;Situation;Appropriate for developmental age  Cognitive Functioning Concentration: Decreased Memory: Recent Intact IQ: Average Insight: Poor Impulse Control: Poor Appetite: Fair Sleep: Decreased  ADLScreening [facility] Assessment Services) Patient's cognitive ability adequate to safely complete daily activities?: Yes Patient able to express need for assistance with ADLs?: Yes Independently performs ADLs?: Yes  Abuse/Neglect [facility]) Physical Abuse: Denies Verbal Abuse: Denies Sexual Abuse: Denies  Prior Inpatient Therapy Prior Inpatient Therapy: Yes Prior Therapy Facilty/Provider(s):  [facility] Reason for Treatment:  (depression, od)  Prior Outpatient Therapy Prior Outpatient Therapy: Yes Prior Therapy Dates:  (current) Prior Therapy Facilty/Provider(s):  ([facility]-med management only) Reason for Treatment:  (depression)  ADL Screening (condition at time of admission) Patient's cognitive ability adequate to safely complete daily activities?: Yes Patient able to express need for assistance with ADLs?: Yes Independently performs ADLs?: Yes Weakness of Legs: None Weakness of Arms/Hands: None  Home Assistive Devices/Equipment Home Assistive Devices/Equipment: None  Therapy Consults (therapy consults require a physician order) PT Evaluation Needed: No OT Evalulation Needed: No SLP Evaluation Needed: No Abuse/Neglect Assessment (Assessment to  be complete while patient is alone) Physical Abuse: Denies Verbal Abuse: Denies Sexual Abuse: Denies Exploitation of patient/patient's resources: Denies Self-Neglect: Denies Values / Beliefs Cultural Requests During Hospitalization: None Spiritual Requests During Hospitalization: None Consults Spiritual Care Consult Needed: No Social Work Consult Needed: No Dispensing optician (For Healthcare) Advance Directive: Patient does not have advance directive;Patient would not like information Pre-existing out of facility DNR order (yellow form or pink MOST form): No Nutrition Screen Diet: Regular Unintentional weight loss greater than 10lbs within the last month: No Problems chewing or swallowing foods and/or liquids: No Home Tube Feeding or Total Parenteral Nutrition (TPN): No Patient appears severely malnourished: No Pregnant or Lactating: No  Additional Information 1:1 In Past 12 Months?: No CIRT Risk: No Elopement Risk: No Does patient have medical clearance?: No     Disposition:  Disposition Disposition of Patient: Inpatient treatment program Type of inpatient treatment program: Adult  Patient states at this time she is willing to voluntarily admit herself to inpatient treatment program, however she is not medically clear to initiate the referral.   On Site Evaluation by:  Dr. Kerry Hough Reviewed with Physician:  Dr. Penelope Galas, Charlyne Quale 07/18/2011 2:05 PM

## 2011-07-18 NOTE — Clinical Social Work Note (Signed)
ACT team was consulted and evaluated pt yesterday and are following. They are working on voluntary inpatient admission.  Pt to have telepsych consult today.   Derenda Fennel, [place] [phone]

## 2011-07-18 NOTE — Progress Notes (Signed)
Subjective: Patient denies any complaints.  Foley catheter was removed early this morning.  Patient has urinated small amounts since then.  Objective: Vital signs in last 24 hours: Temp:  [98.3 F (36.8 C)-98.4 F (36.9 C)] 98.3 F (36.8 C) (07/05 0534) Pulse Rate:  [84-87] 85  (07/05 0816) Resp:  [20] 20  (07/05 0534) BP: (113-128)/(79-85) 113/79 mmHg (07/05 0534) SpO2:  [95 %-97 %] 95 % (07/05 0816) Weight change:  Last BM Date: 07/16/11  Intake/Output from previous day: 07/04 0701 - 07/05 0700 In: 4224.5 [P.O.:1600; I.V.:2624.5] Out: 4675 [Urine:4675]     Physical Exam: General: Alert, awake, oriented x3, in no acute distress. HEENT: No bruits, no goiter. Heart: Regular rate and rhythm, without murmurs, rubs, gallops. Lungs: Clear to auscultation bilaterally. Abdomen: Soft, nontender, nondistended, positive bowel sounds. Extremities: No clubbing cyanosis or edema with positive pedal pulses. Neuro: Grossly intact, nonfocal.    Lab Results: Basic Metabolic Panel:  Basename 07/18/11 0543 07/17/11 0502  NA 141 138  K 4.1 3.9  CL 106 100  CO2 27 27  GLUCOSE 88 89  BUN 6 11  CREATININE 0.65 0.70  CALCIUM 8.8 9.2  MG -- --  PHOS -- --   Liver Function Tests:  Basename 07/18/11 0543 07/17/11 0502  AST 46* 44*  ALT 54* 51*  ALKPHOS 87 84  BILITOT 0.3 0.4  PROT 6.0 6.1  ALBUMIN 3.4* 3.5   No results found for this basename: LIPASE:2,AMYLASE:2 in the last 72 hours No results found for this basename: AMMONIA:2 in the last 72 hours CBC:  Basename 07/17/11 0502 07/16/11 2318  WBC 6.7 7.9  NEUTROABS -- 3.9  HGB 10.9* 11.7*  HCT 32.8* 34.4*  MCV 88.4 86.2  PLT 281 321   Cardiac Enzymes:  Basename 07/16/11 2318  CKTOTAL --  CKMB --  CKMBINDEX --  TROPONINI <0.30   BNP: No results found for this basename: PROBNP:3 in the last 72 hours D-Dimer: No results found for this basename: DDIMER:2 in the last 72 hours CBG: No results found for this basename:  GLUCAP:6 in the last 72 hours Hemoglobin A1C: No results found for this basename: HGBA1C in the last 72 hours Fasting Lipid Panel: No results found for this basename: CHOL,HDL,LDLCALC,TRIG,CHOLHDL,LDLDIRECT in the last 72 hours Thyroid Function Tests: No results found for this basename: TSH,T4TOTAL,FREET4,T3FREE,THYROIDAB in the last 72 hours Anemia Panel: No results found for this basename: VITAMINB12,FOLATE,FERRITIN,TIBC,IRON,RETICCTPCT in the last 72 hours Coagulation: No results found for this basename: LABPROT:2,INR:2 in the last 72 hours Urine Drug Screen: Drugs of Abuse     Component Value Date/Time   LABOPIA NONE DETECTED 07/16/2011 2303   COCAINSCRNUR NONE DETECTED 07/16/2011 2303   LABBENZ NONE DETECTED 07/16/2011 2303   AMPHETMU NONE DETECTED 07/16/2011 2303   THCU NONE DETECTED 07/16/2011 2303   LABBARB NONE DETECTED 07/16/2011 2303    Alcohol Level: No results found for this basename: ETH:2 in the last 72 hours Urinalysis:  Basename 07/16/11 2303  COLORURINE YELLOW  LABSPEC 1.015  PHURINE 6.0  GLUCOSEU NEGATIVE  HGBUR NEGATIVE  BILIRUBINUR NEGATIVE  KETONESUR NEGATIVE  PROTEINUR NEGATIVE  UROBILINOGEN 0.2  NITRITE NEGATIVE  LEUKOCYTESUR NEGATIVE    Recent Results (from the past 240 hour(s))  MRSA PCR SCREENING     Status: Normal   Collection Time   07/17/11  1:40 AM      Component Value Range Status Comment   MRSA by PCR NEGATIVE  NEGATIVE Final     Studies/Results: No results found.  Medications: Scheduled Meds:   . sodium chloride   Intravenous STAT  . gabapentin  600 mg Oral QID  . lacosamide  100 mg Oral BID  . pantoprazole (PROTONIX) IV  40 mg Intravenous Q24H  . PARoxetine  40 mg Oral QHS  . sodium chloride       Continuous Infusions:   . 0.9 % NaCl with KCl 20 mEq / L 100 mL/hr at 07/18/11 0547   PRN Meds:.acetaminophen, acetaminophen, albuterol, alum & mag hydroxide-simeth, ondansetron (ZOFRAN) IV, ondansetron  Assessment/Plan:  Principal  Problem:  *Drug overdose, multiple drugs Active Problems:  Anemia  Urinary retention  Seizure disorder  Depression  Plan:  1. Drug overdose.  Patient is stable. She was evaluated by ACT team and is willing to be placed for continued psychiatric treatment.  She is continued on sitter precautions  2. Urinary retention.  Likely due to sanctura.  Will do voiding trial today.  If patient fails voiding trial, she will need catheter replaced and follow up urology  3. Elevated LFTs, mild elevation, possibly from fatty liver, can be monitored as outpatient  4. Seizure disorder.  On antiepileptics  Patient is medically cleared for discharge.  She can potentially discharge with foley catheter if needed. Will follow up with ACT team.   LOS: 2 days   [name] [facility] Pager: [phone] 07/18/2011, 10:25 AM

## 2011-07-18 NOTE — Discharge Summary (Signed)
Physician Discharge Summary  Patient ID: [name] MRN: [id number] DOB/AGE: 1965-10-19 45 y.o.  Admit date: 07/16/2011 Discharge date: 07/18/2011  Primary Care Physician:  Rudi Heap, MD   Discharge Diagnoses:    Principal Problem:  *Drug overdose, multiple drugs Active Problems:  Anemia  Urinary retention  Seizure disorder  Depression    Medication List  As of 07/18/2011  5:57 PM   STOP taking these medications         trospium 20 MG tablet         TAKE these medications         clonazePAM 0.5 MG tablet   Commonly known as: KLONOPIN   Take 0.5 mg by mouth 3 (three) times daily as needed. For anxiety      gabapentin 600 MG tablet   Commonly known as: NEURONTIN   Take 600 mg by mouth 2 (two) times daily.      ibuprofen 800 MG tablet   Commonly known as: ADVIL,MOTRIN   Take 800 mg by mouth every 8 (eight) hours as needed. For pain      Lacosamide 100 MG Tabs   Take 1 tablet (100 mg total) by mouth 2 (two) times daily.      PARoxetine 20 MG tablet   Commonly known as: PAXIL   Take 40 mg by mouth at bedtime.            Disposition and Follow-up:  Follow up with psychiatrist as outpatient and will need referral to a therapist Follow up with urologist in 1 week for voiding trial Follow up with primary doctor in 2 weeks  Consults: Telepsychiatry, Dr. Leretha Pol   Significant Diagnostic Studies:  No results found.  Brief H and P: For complete details please refer to admission H and P, but in brief This is a 46 year old, Caucasian female, with a past with history of seizure disorder, urinary retention, depression, arthritis who was in her usual state of health till last evening when she apparently took about 45 tablets of trospium, 3 tablets of Klonopin and one tablet of Paxil. These numbers have not been corroborated. But her husband thinks that at least the information regarding Klonopin and Paxil are accurate, but he doesn't know about the Trospium. When  asked why she took some pills patient does not answer clearly. She denies any pain at this time. She denies any suicidal ideation, but she is somewhat sedated. According to the husband the patient remains upset most of the time. There was no incident in her personal life recently. She was hospitalized back in June for seizures, which have since been controlled. So history is limited at this time because the patient is drowsy.     Hospital Course:  Principal Problem:  *Drug overdose, multiple drugs Active Problems:  Anemia  Urinary retention  Seizure disorder  Depression  This lady was admitted to the hospital after an intentional overdose. Patient reports taking a large amount of Sanctura. She was feeling depressed regarding the social situation around her son. Please refer to social work notes for further details. She was admitted to the hospital with some lethargy and urinary retention. Her case was discussed with poison control. She required Foley catheter placement for urinary retention. The cause of the retention was likely the anticholinergic effects of Sanctura. Patient had a similar problem in the past and required Foley catheter placement with urology followup. She was monitored in the hospital and underwent a voiding trial which she failed. Foley catheter was  reinserted. She will need to keep this in place on discharge and follow up with her urologist in one week's time for another voiding trial. Regarding her overdose, she was evaluated by telepsychiatrist Dr. Leretha Pol. It was felt that the patient did not require inpatient psychiatric treatment. Outpatient psychiatric treatment and referral to a therapist for better coping skills was recommended. The patient was continued on the remainder of her outpatient medications. The remainder of her hospital course was unremarkable. She'll be discharged home today with outpatient followup  Time spent on  Discharge:  Signed: MEMON,JEHANZEB [facility] Pager: [phone] 07/18/2011, 5:57 PM

## 2011-07-18 NOTE — Progress Notes (Signed)
UR Chart Review Completed  

## 2011-07-19 NOTE — Plan of Care (Signed)
Problem: Phase III Progression Outcomes Goal: Foley discontinued Outcome: Adequate for Discharge Discharge with foley catheter and urology follow-up

## 2011-07-19 NOTE — Plan of Care (Signed)
Problem: Phase III Progression Outcomes Goal: Voiding independently Outcome: Adequate for Discharge Discharge with foley catheter and urology follow-up

## 2011-09-09 ENCOUNTER — Encounter (INDEPENDENT_AMBULATORY_CARE_PROVIDER_SITE_OTHER): Payer: Self-pay | Admitting: Surgery

## 2011-09-22 ENCOUNTER — Ambulatory Visit (INDEPENDENT_AMBULATORY_CARE_PROVIDER_SITE_OTHER): Payer: Medicare Other | Admitting: Surgery

## 2011-09-22 ENCOUNTER — Encounter (INDEPENDENT_AMBULATORY_CARE_PROVIDER_SITE_OTHER): Payer: Self-pay | Admitting: Surgery

## 2011-09-22 VITALS — BP 114/86 | HR 99 | Temp 97.6°F | Ht 60.0 in | Wt 170.2 lb

## 2011-09-22 DIAGNOSIS — R222 Localized swelling, mass and lump, trunk: Secondary | ICD-10-CM

## 2011-09-22 DIAGNOSIS — R229 Localized swelling, mass and lump, unspecified: Secondary | ICD-10-CM

## 2011-09-22 NOTE — Progress Notes (Signed)
Patient ID: [name], female   DOB: May 22, 1965, 46 y.o.   MRN: [id number]  Chief Complaint  Patient presents with  . Pre-op Exam    eval cyst     HPI [name] is a 46 y.o. female.   HPI She is referred by C.J. Hall PA for evaluation of a painful nodule on [name] right hip. She has noticed it for a least a month. It hurts when she sits and aches. It hurts almost every day. She denies trauma to the area. There has been no drainage. The patient is now referred anywhere else. Past Medical History  Diagnosis Date  . Epilepsy   . Seizures   . Vertigo   . Constipation   . Restless leg   . Arthritis     Past Surgical History  Procedure Date  . Total hip arthroplasty     Left Hip THR X 3  . Abdominal hysterectomy   . Colonoscopy   . Joint replacement     Family History  Problem Relation Age of Onset  . Cancer Mother   . Cancer Brother   . Diabetes Brother   . Cancer Maternal Aunt   . Diabetes Maternal Aunt     Social History History  Substance Use Topics  . Smoking status: Never Smoker   . Smokeless tobacco: Never Used  . Alcohol Use: No    Allergies  Allergen Reactions  . Benadryl (Diphenhydramine Hcl) Other (See Comments)    seizures  . Codeine     rash  . Dilantin (Phenytoin Sodium Extended)     Elevated LFT's  . Melatonin Other (See Comments)    Not sure  . Ultram (Tramadol Hcl) Other (See Comments)    Reaction:Seizures  . Vicodin (Hydrocodone-Acetaminophen) Itching  . Latex Rash  . Penicillins Itching and Rash    Current Outpatient Prescriptions  Medication Sig Dispense Refill  . clonazePAM (KLONOPIN) 0.5 MG tablet Take 0.5 mg by mouth 3 (three) times daily as needed. For anxiety      . gabapentin (NEURONTIN) 600 MG tablet Take 600 mg by mouth 2 (two) times daily.      Marland Kitchen ibuprofen (ADVIL,MOTRIN) 800 MG tablet Take 800 mg by mouth every 8 (eight) hours as needed. For pain      . lacosamide 100 MG TABS Take 1 tablet (100 mg total) by mouth 2  (two) times daily.  60 tablet  0  . PARoxetine (PAXIL) 20 MG tablet Take 40 mg by mouth at bedtime.        Review of Systems Review of Systems  Constitutional: Negative for fever, chills and unexpected weight change.  HENT: Negative for hearing loss, congestion, sore throat, trouble swallowing and voice change.   Eyes: Negative for visual disturbance.  Respiratory: Negative for cough and wheezing.   Cardiovascular: Negative for chest pain, palpitations and leg swelling.  Gastrointestinal: Negative for nausea, vomiting, abdominal pain, diarrhea, constipation, blood in stool, abdominal distention and anal bleeding.  Genitourinary: Negative for hematuria, vaginal bleeding and difficulty urinating.  Musculoskeletal: Positive for arthralgias.  Skin: Negative for rash and wound.  Neurological: Negative for seizures, syncope and headaches.  Hematological: Negative for adenopathy. Does not bruise/bleed easily.  Psychiatric/Behavioral: Negative for confusion.    Blood pressure 114/86, pulse 99, temperature 97.6 F (36.4 C), temperature source Temporal, height 5' (1.524 m), weight 170 lb 3.2 oz (77.202 kg), SpO2 98.00%.  Physical Exam Physical Exam  Constitutional: She is oriented to person, place, and time.  She appears well-developed and well-nourished. No distress.  HENT:  Head: Normocephalic and atraumatic.  Right Ear: External ear normal.  Left Ear: External ear normal.  Nose: Nose normal.  Mouth/Throat: Oropharynx is clear and moist.  Eyes: Conjunctivae are normal. Pupils are equal, round, and reactive to light. Right eye exhibits no discharge. Left eye exhibits no discharge. No scleral icterus.  Neck: Normal range of motion. Neck supple. No tracheal deviation present. No thyromegaly present.  Cardiovascular: Normal rate, regular rhythm and normal heart sounds.   No murmur heard. Pulmonary/Chest: Effort normal and breath sounds normal. No respiratory distress. She has no wheezes. She  has no rales.  Abdominal: Soft. Bowel sounds are normal. She exhibits no distension. There is no tenderness. There is no rebound.  Musculoskeletal: Normal range of motion. She exhibits no edema and no tenderness.       There is a 3 cm mass in the subcutaneous tissue of the right buttock near the hip. It is tender and mobile. There are no skin changes or erythema  Lymphadenopathy:    She has no cervical adenopathy.  Neurological: She is alert and oriented to person, place, and time. No cranial nerve deficit. Coordination normal.  Skin: Skin is warm and dry. No rash noted. She is not diaphoretic. No erythema.  Psychiatric: [name] behavior is normal. Judgment normal.    Data Reviewed   Assessment    3 cm right buttock mass    Plan    Because of the symptoms, excision is recommended. This is also for histologic evaluation to rule out malignancy. This may represent a dermoid tumor. I discussed the risks of surgery which includes but is not limited to bleeding, infection, recurrence, need for further surgery, et Karie Soda. She understands and wishes to proceed. Likelihood of success is good.       Talyah Seder A 09/22/2011, 4:11 PM

## 2011-10-08 ENCOUNTER — Encounter (HOSPITAL_COMMUNITY): Payer: Self-pay | Admitting: Pharmacist

## 2011-10-11 ENCOUNTER — Emergency Department (HOSPITAL_COMMUNITY): Payer: Medicare Other

## 2011-10-11 ENCOUNTER — Encounter (HOSPITAL_COMMUNITY): Payer: Self-pay

## 2011-10-11 ENCOUNTER — Emergency Department (HOSPITAL_COMMUNITY)
Admission: EM | Admit: 2011-10-11 | Discharge: 2011-10-11 | Disposition: A | Discharge status: Home / Self Care | Payer: Medicare Other | Attending: Emergency Medicine | Admitting: Emergency Medicine

## 2011-10-11 DIAGNOSIS — M47812 Spondylosis without myelopathy or radiculopathy, cervical region: Secondary | ICD-10-CM | POA: Insufficient documentation

## 2011-10-11 DIAGNOSIS — G40909 Epilepsy, unspecified, not intractable, without status epilepticus: Secondary | ICD-10-CM | POA: Insufficient documentation

## 2011-10-11 DIAGNOSIS — R51 Headache: Secondary | ICD-10-CM | POA: Insufficient documentation

## 2011-10-11 DIAGNOSIS — R569 Unspecified convulsions: Secondary | ICD-10-CM

## 2011-10-11 DIAGNOSIS — Z79899 Other long term (current) drug therapy: Secondary | ICD-10-CM | POA: Insufficient documentation

## 2011-10-11 DIAGNOSIS — R42 Dizziness and giddiness: Secondary | ICD-10-CM | POA: Insufficient documentation

## 2011-10-11 LAB — COMPREHENSIVE METABOLIC PANEL
ALT: 58 U/L — ABNORMAL HIGH (ref 0–35)
AST: 46 U/L — ABNORMAL HIGH (ref 0–37)
Albumin: 4 g/dL (ref 3.5–5.2)
Alkaline Phosphatase: 74 U/L (ref 39–117)
BUN: 10 mg/dL (ref 6–23)
CO2: 26 mEq/L (ref 19–32)
Calcium: 9.2 mg/dL (ref 8.4–10.5)
Chloride: 103 mEq/L (ref 96–112)
Creatinine, Ser: 0.64 mg/dL (ref 0.50–1.10)
GFR calc Af Amer: >90 mL/min (ref >90)
GFR calc non Af Amer: >90 mL/min (ref >90)
Glucose, Bld: 85 mg/dL (ref 70–99)
Potassium: 4 mEq/L (ref 3.5–5.1)
Sodium: 139 mEq/L (ref 135–145)
Total Bilirubin: 0.3 mg/dL (ref 0.3–1.2)
Total Protein: 6.6 g/dL (ref 6.0–8.3)

## 2011-10-11 LAB — CBC WITH DIFFERENTIAL/PLATELET
Basophils Absolute: 0 10*3/uL (ref 0.0–0.1)
Basophils Relative: 0 % (ref 0–1)
Eosinophils Absolute: 0.1 10*3/uL (ref 0.0–0.7)
Eosinophils Relative: 2 % (ref 0–5)
HCT: 34.1 % — ABNORMAL LOW (ref 36.0–46.0)
Hemoglobin: 11.5 g/dL — ABNORMAL LOW (ref 12.0–15.0)
Lymphocytes Relative: 33 % (ref 12–46)
Lymphs Abs: 2.4 10*3/uL (ref 0.7–4.0)
MCH: 29 pg (ref 26.0–34.0)
MCHC: 33.7 g/dL (ref 30.0–36.0)
MCV: 85.9 fL (ref 78.0–100.0)
Monocytes Absolute: 0.6 10*3/uL (ref 0.1–1.0)
Monocytes Relative: 8 % (ref 3–12)
Neutro Abs: 4.2 10*3/uL (ref 1.7–7.7)
Neutrophils Relative %: 58 % (ref 43–77)
Platelets: 259 10*3/uL (ref 150–400)
RBC: 3.97 MIL/uL (ref 3.87–5.11)
RDW: 13.7 % (ref 11.5–15.5)
WBC: 7.3 10*3/uL (ref 4.0–10.5)

## 2011-10-11 NOTE — ED Notes (Signed)
Provided patient with bedside commode

## 2011-10-11 NOTE — ED Notes (Signed)
Per EMS, pt at [facility] had a seizure and fell. Pt complain of headache and left hip pain. Pt fully immobilized on arrival to ED. No seizure activity noted on arrival to ED.

## 2011-10-11 NOTE — ED Provider Notes (Signed)
History   This chart was scribed for Benny Lennert, MD scribed by Magnus Sinning. The patient was seen in room APA03/APA03 at 15:49   CSN: [id number]  Arrival date & time 10/11/11  1534    Chief Complaint  Patient presents with  . Seizures    (Consider location/radiation/quality/duration/timing/severity/associated sxs/prior treatment) HPI Comments:  Patient states she was at [employer] today when she began seizing. She says she is unsure if she LOC, but does report associated HA and right foot pain.Pt has hx of seizures that she treats with medications, Neurotin and  Vimpat. She explains she has about two seizures per month, but states she takes her medications regularly. PCP: Dr. Christell Constant  Patient is a 46 y.o. female presenting with seizures. The history is provided by the patient. No language interpreter was used.  Seizures  This is a new problem. The current episode started less than 1 hour ago. The problem has been resolved. There was 1 seizure. The most recent episode lasted 30 to 120 seconds. Associated symptoms include headaches (Located at the occiput). Pertinent negatives include patient does not experience confusion, no neck stiffness, no chest pain, no cough and no diarrhea. The episode was witnessed. The seizures did not continue in the ED. The seizure(s) had no focality. Possible causes do not include med or dosage change or missed seizure meds. There has been no fever.     Past Medical History  Diagnosis Date  . Epilepsy   . Seizures   . Vertigo   . Constipation   . Restless leg   . Arthritis     Past Surgical History  Procedure Date  . Total hip arthroplasty     Left Hip THR X 3  . Abdominal hysterectomy   . Colonoscopy   . Joint replacement     Family History  Problem Relation Age of Onset  . Cancer Mother   . Cancer Brother   . Diabetes Brother   . Cancer Maternal Aunt   . Diabetes Maternal Aunt     History  Substance Use Topics  . Smoking status:  Never Smoker   . Smokeless tobacco: Never Used  . Alcohol Use: No   Review of Systems  Constitutional: Negative for fatigue.  HENT: Negative for congestion, neck pain, sinus pressure and ear discharge.   Eyes: Negative for discharge.  Respiratory: Negative for cough.   Cardiovascular: Negative for chest pain.  Gastrointestinal: Negative for abdominal pain and diarrhea.  Genitourinary: Negative for frequency and hematuria.  Musculoskeletal: Negative for back pain.  Skin: Negative for rash.  Neurological: Positive for seizures and headaches (Located at the occiput).  Hematological: Negative.   Psychiatric/Behavioral: Negative for hallucinations and confusion.  All other systems reviewed and are negative.    Allergies  Benadryl; Codeine; Dilantin; Melatonin; Ultram; Vicodin; Latex; and Penicillins  Home Medications   Current Outpatient Rx  Name Route Sig Dispense Refill  . CLONAZEPAM 0.5 MG PO TABS Oral Take 0.5 mg by mouth 3 (three) times daily.    Marland Kitchen FOLIVANE-PLUS PO CAPS Oral Take 1 capsule by mouth daily.    Marland Kitchen FLUOXETINE HCL 20 MG PO CAPS Oral Take 20 mg by mouth daily.    Marland Kitchen GABAPENTIN 600 MG PO TABS Oral Take 600 mg by mouth 4 (four) times daily.    Marland Kitchen LACOSAMIDE 100 MG PO TABS Oral Take 200 mg by mouth 2 (two) times daily. Takes at 8 AM and 8 PM      Ht 5\' 6"  (  1.676 m)  Wt 168 lb (76.204 kg)  BMI 27.12 kg/m2  Physical Exam  Nursing note and vitals reviewed. Constitutional: She is oriented to person, place, and time. She appears well-developed and well-nourished. No distress.  HENT:  Head: Normocephalic and atraumatic.       Tenderness to occipital region   Eyes: Conjunctivae normal and EOM are normal.  Neck: Neck supple. No tracheal deviation present.  Cardiovascular: Normal rate.   Pulmonary/Chest: Effort normal. No respiratory distress.  Abdominal: She exhibits no distension.  Musculoskeletal: Normal range of motion.  Neurological: She is alert and oriented to  person, place, and time. No sensory deficit.  Skin: Skin is dry.  Psychiatric: She has a normal mood and affect. Her behavior is normal.    ED Course  Procedures (including critical care time) DIAGNOSTIC STUDIES: Oxygen Saturation is 98% on room air, normal by my interpretation.    COORDINATION OF CARE: 15:50: Physical exam performed.  Labs Reviewed  CBC WITH DIFFERENTIAL - Abnormal; Notable for the following:    Hemoglobin 11.5 (*)     HCT 34.1 (*)     All other components within normal limits  COMPREHENSIVE METABOLIC PANEL - Abnormal; Notable for the following:    AST 46 (*)     ALT 58 (*)     All other components within normal limits   Ct Head Wo Contrast  10/11/2011  *RADIOLOGY REPORT*  Clinical Data:   History of epilepsy.  Vertigo and hysterectomy. Seizure with trauma toback of head.  CT HEAD WITHOUT CONTRAST  Technique: Contiguous axial images were obtained from the base of the skull through the vertex without contrast  Comparison: Head CT of 06/22/2011.  Cervical spine CT of 04/23/2011.  Findings:  Bone windows demonstrate no definite scalp soft tissue swelling. No skull fracture.  Clear paranasal sinuses and mastoid air cells.  Soft tissue windows demonstrate no  mass lesion, hemorrhage, hydrocephalus, acute infarct, intra-axial, or extra-axial fluid collection.  IMPRESSION: Normal head CT  CT CERVICAL SPINE WITHOUT CONTRAST  Technique: Continous axial images were obtained of the cervical spine without contrast.  Sagittal and coronal reformats were constructed.  Findings:  Spinal visualization through bottom of T1.  Prevertebral soft tissues are within normal limits.  Nonspecific calcifications within the parotid glands bilaterally. No apical pneumothorax. Small posterior triangle nodes bilaterally which are likely reactive.  Skull base intact. Maintenance of vertebral body height.  Mild straightening of expected cervical lordosis.  Spondylosis at C6-C7 with loss of intervertebral  disc height and endplate osteophyte formation. Facets are well-aligned.  Coronal reformats demonstrate a normal C1-C2 articulation.  IMPRESSION:  1.  No acute fracture or subluxation. 2. Straightening of expected cervical lordosis could be positional, due to muscular spasm, or ligamentous injury.   Original Report Authenticated By: Consuello Bossier, M.D.    Ct Cervical Spine Wo Contrast  10/11/2011  *RADIOLOGY REPORT*  Clinical Data:   History of epilepsy.  Vertigo and hysterectomy. Seizure with trauma toback of head.  CT HEAD WITHOUT CONTRAST  Technique: Contiguous axial images were obtained from the base of the skull through the vertex without contrast  Comparison: Head CT of 06/22/2011.  Cervical spine CT of 04/23/2011.  Findings:  Bone windows demonstrate no definite scalp soft tissue swelling. No skull fracture.  Clear paranasal sinuses and mastoid air cells.  Soft tissue windows demonstrate no  mass lesion, hemorrhage, hydrocephalus, acute infarct, intra-axial, or extra-axial fluid collection.  IMPRESSION: Normal head CT  CT CERVICAL SPINE WITHOUT  CONTRAST  Technique: Continous axial images were obtained of the cervical spine without contrast.  Sagittal and coronal reformats were constructed.  Findings:  Spinal visualization through bottom of T1.  Prevertebral soft tissues are within normal limits.  Nonspecific calcifications within the parotid glands bilaterally. No apical pneumothorax. Small posterior triangle nodes bilaterally which are likely reactive.  Skull base intact. Maintenance of vertebral body height.  Mild straightening of expected cervical lordosis.  Spondylosis at C6-C7 with loss of intervertebral disc height and endplate osteophyte formation. Facets are well-aligned.  Coronal reformats demonstrate a normal C1-C2 articulation.  IMPRESSION:  1.  No acute fracture or subluxation. 2. Straightening of expected cervical lordosis could be positional, due to muscular spasm, or ligamentous injury.    Original Report Authenticated By: Consuello Bossier, M.D.      No diagnosis found.    MDM   The chart was scribed for me under my direct supervision.  I personally performed the history, physical, and medical decision making and all procedures in the evaluation of this patient.Benny Lennert, MD 10/11/11 [phone]

## 2011-10-11 NOTE — ED Notes (Signed)
Pt discharged. Pt stable at time of discharge. Medications reviewed pt has no questions regarding discharge at this time. Pt voiced understanding of discharge instructions.  

## 2011-10-11 NOTE — ED Notes (Signed)
Pt given ativan 2 mg IV prior to arrival

## 2011-10-14 ENCOUNTER — Encounter (HOSPITAL_COMMUNITY)
Admission: RE | Admit: 2011-10-14 | Discharge: 2011-10-14 | Disposition: A | Discharge status: Home / Self Care | Payer: Medicare Other | Source: Ambulatory Visit | Attending: Surgery | Admitting: Surgery

## 2011-10-14 ENCOUNTER — Encounter (HOSPITAL_COMMUNITY): Payer: Self-pay

## 2011-10-14 LAB — SURGICAL PCR SCREEN
MRSA, PCR: NEGATIVE
Staphylococcus aureus: NEGATIVE

## 2011-10-14 LAB — CBC
HCT: 36.6 % (ref 36.0–46.0)
Hemoglobin: 12.5 g/dL (ref 12.0–15.0)
MCH: 29.1 pg (ref 26.0–34.0)
MCHC: 34.2 g/dL (ref 30.0–36.0)
MCV: 85.3 fL (ref 78.0–100.0)
Platelets: 285 10*3/uL (ref 150–400)
RBC: 4.29 MIL/uL (ref 3.87–5.11)
RDW: 14.1 % (ref 11.5–15.5)
WBC: 8.1 10*3/uL (ref 4.0–10.5)

## 2011-10-14 LAB — BASIC METABOLIC PANEL
BUN: 13 mg/dL (ref 6–23)
CO2: 27 mEq/L (ref 19–32)
Calcium: 10.4 mg/dL (ref 8.4–10.5)
Chloride: 100 mEq/L (ref 96–112)
Creatinine, Ser: 0.65 mg/dL (ref 0.50–1.10)
GFR calc Af Amer: >90 mL/min (ref >90)
GFR calc non Af Amer: >90 mL/min (ref >90)
Glucose, Bld: 85 mg/dL (ref 70–99)
Potassium: 3.7 mEq/L (ref 3.5–5.1)
Sodium: 139 mEq/L (ref 135–145)

## 2011-10-14 NOTE — Pre-Procedure Instructions (Signed)
20 DONIECE AZURDIA  10/14/2011   Your procedure is scheduled on:  10/22/11  Report to [facility] at 700 AM.  Call this number if you have problems the morning of surgery: [phone]   Remember:   Do not eat food:After Midnight.    Take these medicines the morning of surgery with A SIP OF WATER: klonopin,prozac,neurontin   Do not wear jewelry, make-up or nail polish.  Do not wear lotions, powders, or perfumes. You may wear deodorant.  Do not shave 48 hours prior to surgery. Men may shave face and neck.  Do not bring valuables to the hospital.  Contacts, dentures or bridgework may not be worn into surgery.  Leave suitcase in the car. After surgery it may be brought to your room.  For patients admitted to the hospital, checkout time is 11:00 AM the day of discharge.   Patients discharged the day of surgery will not be allowed to drive home.  Name and phone number of your driver: family  Special Instructions: Shower using CHG 2 nights before surgery and the night before surgery.  If you shower the day of surgery use CHG.  Use special wash - you have one bottle of CHG for all showers.  You should use approximately 1/3 of the bottle for each shower.   Please read over the following fact sheets that you were given: Pain Booklet, Coughing and Deep Breathing, MRSA Information and Surgical Site Infection Prevention

## 2011-10-15 ENCOUNTER — Telehealth (INDEPENDENT_AMBULATORY_CARE_PROVIDER_SITE_OTHER): Payer: Self-pay | Admitting: Surgery

## 2011-10-15 NOTE — Telephone Encounter (Signed)
[name] notified me of patient's seizure disorder.  Would treat with benzo if occurs post op

## 2011-10-21 MED ORDER — CIPROFLOXACIN IN D5W 400 MG/200ML IV SOLN
400.0000 mg | INTRAVENOUS | Status: AC
  Administered 2011-10-22: 200 mg via INTRAVENOUS
  Filled 2011-10-21: qty 200

## 2011-10-21 NOTE — H&P (Signed)
Chief Complaint   Patient presents with   .  Pre-op Exam     eval cyst    HPI  [name] is a 46 y.o. female.  HPI  She is referred by C.J. Hall PA for evaluation of a painful nodule on her right hip. She has noticed it for a least a month. It hurts when she sits and aches. It hurts almost every day. She denies trauma to the area. There has been no drainage. The patient is now referred anywhere else.  Past Medical History   Diagnosis  Date   .  Epilepsy    .  Seizures    .  Vertigo    .  Constipation    .  Restless leg    .  Arthritis     Past Surgical History   Procedure  Date   .  Total hip arthroplasty      Left Hip THR X 3   .  Abdominal hysterectomy    .  Colonoscopy    .  Joint replacement     Family History   Problem  Relation  Age of Onset   .  Cancer  Mother    .  Cancer  Brother    .  Diabetes  Brother    .  Cancer  Maternal Aunt    .  Diabetes  Maternal Aunt     Social History  History   Substance Use Topics   .  Smoking status:  Never Smoker   .  Smokeless tobacco:  Never Used   .  Alcohol Use:  No    Allergies   Allergen  Reactions   .  Benadryl (Diphenhydramine Hcl)  Other (See Comments)     seizures   .  Codeine      rash   .  Dilantin (Phenytoin Sodium Extended)      Elevated LFT's   .  Melatonin  Other (See Comments)     Not sure   .  Ultram (Tramadol Hcl)  Other (See Comments)     Reaction:Seizures   .  Vicodin (Hydrocodone-Acetaminophen)  Itching   .  Latex  Rash   .  Penicillins  Itching and Rash    Current Outpatient Prescriptions   Medication  Sig  Dispense  Refill   .  clonazePAM (KLONOPIN) 0.5 MG tablet  Take 0.5 mg by mouth 3 (three) times daily as needed. For anxiety     .  gabapentin (NEURONTIN) 600 MG tablet  Take 600 mg by mouth 2 (two) times daily.     Marland Kitchen  ibuprofen (ADVIL,MOTRIN) 800 MG tablet  Take 800 mg by mouth every 8 (eight) hours as needed. For pain     .  lacosamide 100 MG TABS  Take 1 tablet (100 mg total) by  mouth 2 (two) times daily.  60 tablet  0   .  PARoxetine (PAXIL) 20 MG tablet  Take 40 mg by mouth at bedtime.      Review of Systems  Review of Systems  Constitutional: Negative for fever, chills and unexpected weight change.  HENT: Negative for hearing loss, congestion, sore throat, trouble swallowing and voice change.  Eyes: Negative for visual disturbance.  Respiratory: Negative for cough and wheezing.  Cardiovascular: Negative for chest pain, palpitations and leg swelling.  Gastrointestinal: Negative for nausea, vomiting, abdominal pain, diarrhea, constipation, blood in stool, abdominal distention and anal bleeding.  Genitourinary: Negative for hematuria, vaginal bleeding and  difficulty urinating.  Musculoskeletal: Positive for arthralgias.  Skin: Negative for rash and wound.  Neurological: Negative for seizures, syncope and headaches.  Hematological: Negative for adenopathy. Does not bruise/bleed easily.  Psychiatric/Behavioral: Negative for confusion.   Blood pressure 114/86, pulse 99, temperature 97.6 F (36.4 C), temperature source Temporal, height 5' (1.524 m), weight 170 lb 3.2 oz (77.202 kg), SpO2 98.00%.  Physical Exam  Physical Exam  Constitutional: She is oriented to person, place, and time. She appears well-developed and well-nourished. No distress.  HENT:  Head: Normocephalic and atraumatic.  Right Ear: External ear normal.  Left Ear: External ear normal.  Nose: Nose normal.  Mouth/Throat: Oropharynx is clear and moist.  Eyes: Conjunctivae are normal. Pupils are equal, round, and reactive to light. Right eye exhibits no discharge. Left eye exhibits no discharge. No scleral icterus.  Neck: Normal range of motion. Neck supple. No tracheal deviation present. No thyromegaly present.  Cardiovascular: Normal rate, regular rhythm and normal heart sounds.  No murmur heard.  Pulmonary/Chest: Effort normal and breath sounds normal. No respiratory distress. She has no wheezes.  She has no rales.  Abdominal: Soft. Bowel sounds are normal. She exhibits no distension. There is no tenderness. There is no rebound.  Musculoskeletal: Normal range of motion. She exhibits no edema and no tenderness.  There is a 3 cm mass in the subcutaneous tissue of the right buttock near the hip. It is tender and mobile. There are no skin changes or erythema  Lymphadenopathy:  She has no cervical adenopathy.  Neurological: She is alert and oriented to person, place, and time. No cranial nerve deficit. Coordination normal.  Skin: Skin is warm and dry. No rash noted. She is not diaphoretic. No erythema.  Psychiatric: Her behavior is normal. Judgment normal.   Data Reviewed  Assessment   3 cm right buttock mass   Plan   Because of the symptoms, excision is recommended. This is also for histologic evaluation to rule out malignancy. This may represent a dermoid tumor. I discussed the risks of surgery which includes but is not limited to bleeding, infection, recurrence, need for further surgery, et Karie Soda. She understands and wishes to proceed. Likelihood of success is good.   Sherril Shipman A

## 2011-10-22 ENCOUNTER — Encounter (HOSPITAL_COMMUNITY)
Admission: RE | Disposition: A | Discharge status: Home / Self Care | Payer: Self-pay | Source: Ambulatory Visit | Attending: Surgery

## 2011-10-22 ENCOUNTER — Encounter (HOSPITAL_COMMUNITY): Payer: Self-pay | Admitting: *Deleted

## 2011-10-22 ENCOUNTER — Ambulatory Visit (HOSPITAL_COMMUNITY)
Admission: RE | Admit: 2011-10-22 | Discharge: 2011-10-22 | Disposition: A | Discharge status: Home / Self Care | Payer: Medicare Other | Source: Ambulatory Visit | Attending: Surgery | Admitting: Surgery

## 2011-10-22 ENCOUNTER — Encounter (HOSPITAL_COMMUNITY): Payer: Self-pay | Admitting: Anesthesiology

## 2011-10-22 ENCOUNTER — Ambulatory Visit (HOSPITAL_COMMUNITY): Payer: Medicare Other | Admitting: Anesthesiology

## 2011-10-22 DIAGNOSIS — L988 Other specified disorders of the skin and subcutaneous tissue: Secondary | ICD-10-CM

## 2011-10-22 DIAGNOSIS — Z01812 Encounter for preprocedural laboratory examination: Secondary | ICD-10-CM | POA: Insufficient documentation

## 2011-10-22 HISTORY — PX: MASS EXCISION: SHX2000

## 2011-10-22 SURGERY — EXCISION MASS
Anesthesia: Monitor Anesthesia Care | Site: Buttocks | Laterality: Right | Wound class: Clean

## 2011-10-22 MED ORDER — LACTATED RINGERS IV SOLN
INTRAVENOUS | Status: DC | PRN
  Administered 2011-10-22: 08:00:00 via INTRAVENOUS

## 2011-10-22 MED ORDER — BUPIVACAINE-EPINEPHRINE PF 0.25-1:200000 % IJ SOLN
INTRAMUSCULAR | Status: AC
  Filled 2011-10-22: qty 30

## 2011-10-22 MED ORDER — LIDOCAINE HCL (PF) 1 % IJ SOLN
INTRAMUSCULAR | Status: AC
  Filled 2011-10-22: qty 30

## 2011-10-22 MED ORDER — DIPHENHYDRAMINE HCL 50 MG/ML IJ SOLN
6.2500 mg | Freq: Once | INTRAMUSCULAR | Status: AC
  Administered 2011-10-22: 6.25 mg via INTRAVENOUS

## 2011-10-22 MED ORDER — MIDAZOLAM HCL 5 MG/5ML IJ SOLN
INTRAMUSCULAR | Status: DC | PRN
  Administered 2011-10-22: 2 mg via INTRAVENOUS

## 2011-10-22 MED ORDER — 0.9 % SODIUM CHLORIDE (POUR BTL) OPTIME
TOPICAL | Status: DC | PRN
  Administered 2011-10-22: 1000 mL

## 2011-10-22 MED ORDER — BUPIVACAINE-EPINEPHRINE 0.25% -1:200000 IJ SOLN
INTRAMUSCULAR | Status: DC | PRN
  Administered 2011-10-22: 10 mL

## 2011-10-22 MED ORDER — PROPOFOL 10 MG/ML IV BOLUS
INTRAVENOUS | Status: DC | PRN
  Administered 2011-10-22 (×2): 10 mg via INTRAVENOUS

## 2011-10-22 MED ORDER — IBUPROFEN 200 MG PO TABS: 600.0000 mg | ORAL_TABLET | Freq: Four times a day (QID) | ORAL | Status: DC | PRN

## 2011-10-22 MED ORDER — DIPHENHYDRAMINE HCL 50 MG/ML IJ SOLN
INTRAMUSCULAR | Status: AC
  Filled 2011-10-22: qty 1

## 2011-10-22 MED ORDER — FENTANYL CITRATE 0.05 MG/ML IJ SOLN
INTRAMUSCULAR | Status: DC | PRN
  Administered 2011-10-22 (×2): 50 ug via INTRAVENOUS

## 2011-10-22 MED ORDER — KETOROLAC TROMETHAMINE 30 MG/ML IJ SOLN
INTRAMUSCULAR | Status: DC | PRN
  Administered 2011-10-22: 30 mg via INTRAVENOUS

## 2011-10-22 MED ORDER — LIDOCAINE HCL (PF) 1 % IJ SOLN
INTRAMUSCULAR | Status: DC | PRN
  Administered 2011-10-22: 15 mL

## 2011-10-22 MED ORDER — FENTANYL CITRATE 0.05 MG/ML IJ SOLN: 25.0000 ug | INTRAMUSCULAR | Status: DC | PRN

## 2011-10-22 SURGICAL SUPPLY — 41 items
BENZOIN TINCTURE PRP APPL 2/3 (GAUZE/BANDAGES/DRESSINGS) IMPLANT
BLADE SURG 10 STRL SS (BLADE) ×2 IMPLANT
BLADE SURG 15 STRL LF DISP TIS (BLADE) ×1 IMPLANT
BLADE SURG 15 STRL SS (BLADE) ×1
CANISTER SUCTION 2500CC (MISCELLANEOUS) IMPLANT
CLOTH BEACON ORANGE TIMEOUT ST (SAFETY) ×2 IMPLANT
COVER SURGICAL LIGHT HANDLE (MISCELLANEOUS) ×2 IMPLANT
DECANTER SPIKE VIAL GLASS SM (MISCELLANEOUS) IMPLANT
DRAPE LAPAROSCOPIC ABDOMINAL (DRAPES) IMPLANT
DRAPE PED LAPAROTOMY (DRAPES) ×2 IMPLANT
ELECT CAUTERY BLADE 6.4 (BLADE) ×2 IMPLANT
ELECT REM PT RETURN 9FT ADLT (ELECTROSURGICAL) ×2
ELECTRODE REM PT RTRN 9FT ADLT (ELECTROSURGICAL) ×1 IMPLANT
GLOVE BIO SURGEON STRL SZ7.5 (GLOVE) IMPLANT
GLOVE BIOGEL PI IND STRL 7.5 (GLOVE) ×1 IMPLANT
GLOVE BIOGEL PI INDICATOR 7.5 (GLOVE) ×1
GLOVE SURG SIGNA 7.5 PF LTX (GLOVE) IMPLANT
GLOVE SURG SS PI 7.5 STRL IVOR (GLOVE) ×6 IMPLANT
GOWN PREVENTION PLUS XLARGE (GOWN DISPOSABLE) ×2 IMPLANT
GOWN STRL NON-REIN LRG LVL3 (GOWN DISPOSABLE) ×2 IMPLANT
KIT BASIN OR (CUSTOM PROCEDURE TRAY) ×2 IMPLANT
KIT ROOM TURNOVER OR (KITS) ×2 IMPLANT
NEEDLE HYPO 25X1 1.5 SAFETY (NEEDLE) ×2 IMPLANT
NS IRRIG 1000ML POUR BTL (IV SOLUTION) ×2 IMPLANT
PACK SURGICAL SETUP 50X90 (CUSTOM PROCEDURE TRAY) ×2 IMPLANT
PAD ARMBOARD 7.5X6 YLW CONV (MISCELLANEOUS) ×4 IMPLANT
PENCIL BUTTON HOLSTER BLD 10FT (ELECTRODE) ×2 IMPLANT
SPECIMEN JAR SMALL (MISCELLANEOUS) ×2 IMPLANT
SPONGE GAUZE 4X4 12PLY (GAUZE/BANDAGES/DRESSINGS) ×2 IMPLANT
SPONGE LAP 18X18 X RAY DECT (DISPOSABLE) ×2 IMPLANT
STRIP CLOSURE SKIN 1/2X4 (GAUZE/BANDAGES/DRESSINGS) IMPLANT
SUT MNCRL AB 4-0 PS2 18 (SUTURE) ×2 IMPLANT
SUT VIC AB 3-0 SH 27 (SUTURE) ×1
SUT VIC AB 3-0 SH 27XBRD (SUTURE) ×1 IMPLANT
SYR BULB 3OZ (MISCELLANEOUS) ×2 IMPLANT
SYR CONTROL 10ML LL (SYRINGE) ×2 IMPLANT
TAPE CLOTH SURG 4X10 WHT LF (GAUZE/BANDAGES/DRESSINGS) ×2 IMPLANT
TOWEL OR 17X24 6PK STRL BLUE (TOWEL DISPOSABLE) ×2 IMPLANT
TOWEL OR 17X26 10 PK STRL BLUE (TOWEL DISPOSABLE) ×2 IMPLANT
TUBE CONNECTING 12X1/4 (SUCTIONS) ×2 IMPLANT
YANKAUER SUCT BULB TIP NO VENT (SUCTIONS) ×2 IMPLANT

## 2011-10-22 NOTE — Interval H&P Note (Signed)
History and Physical Interval Note:  No change in H and P  10/22/2011 8:04 AM  [name]  has presented today for surgery, with the diagnosis of right buttock mass  The various methods of treatment have been discussed with the patient and family. After consideration of risks, benefits and other options for treatment, the patient has consented to  Procedure(s) (LRB) with comments: EXCISION MASS (Right) - excision right buttock mass as a surgical intervention .  The patient's history has been reviewed, patient examined, no change in status, stable for surgery.  I have reviewed the patient's chart and labs.  Questions were answered to the patient's satisfaction.     Latika Kronick A

## 2011-10-22 NOTE — Progress Notes (Signed)
Pt experienced seizure activity lasting approx 30 seconds dr Randa Evens paged and here vs stable sats 99% dr Magnus Ivan also notifed ok to d/c home

## 2011-10-22 NOTE — Transfer of Care (Signed)
Immediate Anesthesia Transfer of Care Note  Patient: [name]  Procedure(s) Performed: Procedure(s) (LRB) with comments: EXCISION MASS (Right) - excision right buttock mass  Patient Location: PACU  Anesthesia Type: MAC  Level of Consciousness: sedated and patient cooperative  Airway & Oxygen Therapy: Patient Spontanous Breathing and Patient connected to nasal cannula oxygen  Post-op Assessment: Report given to PACU RN, Post -op Vital signs reviewed and stable and Patient moving all extremities X 4  Post vital signs: Reviewed and stable  Complications: No apparent anesthesia complications

## 2011-10-22 NOTE — Discharge Instructions (Signed)
Ok to shower starting tomorrow.  May remove bandage but leave steri strips in place  Ice pack, ibuprofen for pain

## 2011-10-22 NOTE — Progress Notes (Signed)
Report given to phillip rn as caregiver 

## 2011-10-22 NOTE — Anesthesia Postprocedure Evaluation (Signed)
  Anesthesia Post-op Note  Patient: [name]  Procedure(s) Performed: Procedure(s) (LRB) with comments: EXCISION MASS (Right) - excision right buttock mass  Patient Location: PACU  Anesthesia Type: MAC  Level of Consciousness: awake, hx of epilepsy with thought seizure in Pacu, Stable hemodynamics, Dr. Magnus Ivan aware and at beside evaluated patient. Dr. Magnus Ivan had discussed hx of epilepsy with [name] preoperatively and well aware of seizure history.  Airway and Oxygen Therapy: Patient Spontanous Breathing  Post-op Pain: mild  Post-op Assessment: Post-op Vital signs reviewed  Post-op Vital Signs: Reviewed  Complications: No apparent anesthesia complications

## 2011-10-22 NOTE — Anesthesia Preprocedure Evaluation (Addendum)
Anesthesia Evaluation  Patient identified by MRN, date of birth, ID band Patient awake    Reviewed: Allergy & Precautions, H&P , NPO status , Patient's Chart, lab work & pertinent test results  History of Anesthesia Complications Negative for: history of anesthetic complications  Airway Mallampati: I TM Distance: >3 FB Neck ROM: Full    Dental  (+) Edentulous Upper and Edentulous Lower   Pulmonary neg pulmonary ROS,          Cardiovascular negative cardio ROS      Neuro/Psych  Headaches, Seizures -,     GI/Hepatic negative GI ROS, Pt denies having hepatitis   Endo/Other  negative endocrine ROS  Renal/GU negative Renal ROS  negative genitourinary   Musculoskeletal negative musculoskeletal ROS (+)   Abdominal   Peds negative pediatric ROS (+)  Hematology negative hematology ROS (+)   Anesthesia Other Findings Pt has epilepsy. Last seizure was 2 wks ago.  Reproductive/Obstetrics negative OB ROS                           Anesthesia Physical Anesthesia Plan  ASA: II  Anesthesia Plan: MAC   Post-op Pain Management:    Induction: Intravenous  Airway Management Planned: Simple Face Mask  Additional Equipment:   Intra-op Plan:   Post-operative Plan:   Informed Consent:   Plan Discussed with: CRNA, Anesthesiologist and Surgeon  Anesthesia Plan Comments:        Anesthesia Quick Evaluation

## 2011-10-22 NOTE — Op Note (Signed)
EXCISION MASS  Procedure Note  [name] 10/22/2011   Pre-op Diagnosis: right buttock mass     Post-op Diagnosis: same  Procedure(s): EXCISION 3CM RIGHT BUTTOCK MASS  Surgeon(s): Shelly Rubenstein, MD  Anesthesia: Monitor Anesthesia Care  Staff:  Doy Mince, RN - Circulator Janeece Agee Pingue, CST - Scrub Person Hardin Negus, RN - Circulator Assistant Megan Day Cavanaugh, RN - Circulator  Estimated Blood Loss: Minimal               Specimens: sent to path         Findings: The patient was 3 cm mass in the subcutaneous tissue which appeared consistent with fat necrosis  Procedure: The patient was brought to the operating room and identified as the correct patient. She was placed supine on the operating room table and then turned to the left lateral decubitus position. Her right buttock was then prepped and draped in usual sterile fashion. I anesthetized the skin with lidocaine. I made an incision with a scalpel. I took this down to the mass with the cautery. The mass appeared consistent with calcified fat necrosis. It was approximately 3 cm in size. I removed it in its entirety with the cautery. I anesthetized the wound further with Marcaine. Hemostasis was achieved with cautery. I closed the subcutaneous tissue with interrupted 3-0 Vicryl sutures and closed the skin with a running 4-0 Monocryl. Steri-Strips, gauze, and tape were applied. The patient tolerated the procedure well. All the counts were correct at the end of the procedure. The patient was then extubated in the operating room and taken in a stable condition to the recovery room. Sael Furches A   Date: 10/22/2011  Time: 9:13 AM

## 2011-10-24 ENCOUNTER — Encounter (HOSPITAL_COMMUNITY): Payer: Self-pay | Admitting: Surgery

## 2011-10-27 ENCOUNTER — Other Ambulatory Visit: Payer: Self-pay | Admitting: Family Medicine

## 2011-10-27 DIAGNOSIS — R109 Unspecified abdominal pain: Secondary | ICD-10-CM

## 2011-10-27 DIAGNOSIS — R748 Abnormal levels of other serum enzymes: Secondary | ICD-10-CM

## 2011-10-30 ENCOUNTER — Ambulatory Visit (HOSPITAL_COMMUNITY)
Admission: RE | Admit: 2011-10-30 | Discharge: 2011-10-30 | Disposition: A | Discharge status: Home / Self Care | Payer: Medicare Other | Source: Ambulatory Visit | Attending: Family Medicine | Admitting: Family Medicine

## 2011-10-30 DIAGNOSIS — R7402 Elevation of levels of lactic acid dehydrogenase (LDH): Secondary | ICD-10-CM | POA: Insufficient documentation

## 2011-10-30 DIAGNOSIS — R7401 Elevation of levels of liver transaminase levels: Secondary | ICD-10-CM | POA: Insufficient documentation

## 2011-10-30 DIAGNOSIS — R748 Abnormal levels of other serum enzymes: Secondary | ICD-10-CM

## 2011-10-30 DIAGNOSIS — R109 Unspecified abdominal pain: Secondary | ICD-10-CM | POA: Insufficient documentation

## 2011-11-05 ENCOUNTER — Ambulatory Visit (INDEPENDENT_AMBULATORY_CARE_PROVIDER_SITE_OTHER): Payer: Medicare Other | Admitting: Surgery

## 2011-11-05 ENCOUNTER — Encounter (INDEPENDENT_AMBULATORY_CARE_PROVIDER_SITE_OTHER): Payer: Self-pay | Admitting: Surgery

## 2011-11-05 VITALS — BP 132/80 | HR 74 | Temp 98.0°F | Resp 18 | Ht 60.0 in | Wt 174.2 lb

## 2011-11-05 DIAGNOSIS — Z09 Encounter for follow-up examination after completed treatment for conditions other than malignant neoplasm: Secondary | ICD-10-CM

## 2011-11-05 NOTE — Progress Notes (Signed)
Subjective:     Patient ID: [name], female   DOB: 1965-03-19, 46 y.o.   MRN: [id number]  HPI She is doing well postop. She has no complaints  Review of Systems     Objective:   Physical Exam On exam, the incision is healing well. The final pathology showed fat necrosis with no evidence of malignancy    Assessment:     Patient stable postop    Plan:     I will see her back as needed

## 2012-04-05 ENCOUNTER — Telehealth: Payer: Self-pay | Admitting: *Deleted

## 2012-04-07 NOTE — Telephone Encounter (Signed)
Busy signal. I called home number and pt then husband got on the phone about her needing an appt, Dr/ Dohmeier and had seen C. Daphine Deutscher, NP in the past.  S he had an seizure last Saturday pm and then on Sunday and fell and hit back of head, produced a knot, and is now back to her usual self.  She did not go to ER.  S he has been stressed, re: fmaily matters and also not sleeping as well.  Will send to [facility] Dr. Pearlean Brownie and also back to Ammie S. Re: appt.

## 2012-04-07 NOTE — Telephone Encounter (Signed)
Please try calling patient again tomorrow.

## 2012-04-07 NOTE — Telephone Encounter (Signed)
Busy signal

## 2012-04-08 ENCOUNTER — Telehealth: Payer: Self-pay | Admitting: *Deleted

## 2012-04-08 NOTE — Telephone Encounter (Signed)
Patient has been sched and informed that she would be placed on cancellation list as well.

## 2012-04-14 NOTE — Telephone Encounter (Addendum)
I called pt again.  No more seizures.  She is asking about taking meds for her arthritis pain in her legs.  Has had multiple hip replacements.  She is concerned about these making her seizure meds less effective.  Please advise.   On lamictal, neurotin, clonazepam, prozac.  She stated her head still has knot, and headache comes and goes.

## 2012-04-16 NOTE — Telephone Encounter (Signed)
Agree with plan 

## 2012-04-16 NOTE — Telephone Encounter (Signed)
Patient was called back and VM left by me- take your seizure medications, go to PCP for the evaluation of the bump on your head , dr Christell Constant. You may take NSAIDS for headaches. CD.  there is no need for an urgent neurology visit. Patient can see NP in  June when regular RV is due.

## 2012-04-16 NOTE — Telephone Encounter (Signed)
Needs appointment to be seen by someone in the office

## 2012-04-20 NOTE — Telephone Encounter (Signed)
Spoke with Ammie, clincal asst re: appt for pt yesterday, she is to call pt.

## 2012-04-21 ENCOUNTER — Emergency Department (HOSPITAL_BASED_OUTPATIENT_CLINIC_OR_DEPARTMENT_OTHER): Payer: Medicare Other

## 2012-04-21 ENCOUNTER — Encounter (HOSPITAL_BASED_OUTPATIENT_CLINIC_OR_DEPARTMENT_OTHER): Payer: Self-pay | Admitting: Family Medicine

## 2012-04-21 ENCOUNTER — Inpatient Hospital Stay (HOSPITAL_BASED_OUTPATIENT_CLINIC_OR_DEPARTMENT_OTHER)
Admission: EM | Admit: 2012-04-21 | Discharge: 2012-04-23 | DRG: 101 | Disposition: A | Discharge status: Home / Self Care | Payer: Medicare Other | Attending: Internal Medicine | Admitting: Internal Medicine

## 2012-04-21 DIAGNOSIS — E876 Hypokalemia: Secondary | ICD-10-CM | POA: Diagnosis present

## 2012-04-21 DIAGNOSIS — R1031 Right lower quadrant pain: Secondary | ICD-10-CM

## 2012-04-21 DIAGNOSIS — R569 Unspecified convulsions: Secondary | ICD-10-CM

## 2012-04-21 DIAGNOSIS — K716 Toxic liver disease with hepatitis, not elsewhere classified: Secondary | ICD-10-CM

## 2012-04-21 DIAGNOSIS — Z888 Allergy status to other drugs, medicaments and biological substances status: Secondary | ICD-10-CM

## 2012-04-21 DIAGNOSIS — Z96659 Presence of unspecified artificial knee joint: Secondary | ICD-10-CM

## 2012-04-21 DIAGNOSIS — G40909 Epilepsy, unspecified, not intractable, without status epilepticus: Secondary | ICD-10-CM | POA: Diagnosis present

## 2012-04-21 DIAGNOSIS — R339 Retention of urine, unspecified: Secondary | ICD-10-CM

## 2012-04-21 DIAGNOSIS — G40309 Generalized idiopathic epilepsy and epileptic syndromes, not intractable, without status epilepticus: Principal | ICD-10-CM | POA: Diagnosis present

## 2012-04-21 DIAGNOSIS — Z833 Family history of diabetes mellitus: Secondary | ICD-10-CM

## 2012-04-21 DIAGNOSIS — R51 Headache: Secondary | ICD-10-CM

## 2012-04-21 DIAGNOSIS — F3289 Other specified depressive episodes: Secondary | ICD-10-CM | POA: Diagnosis present

## 2012-04-21 DIAGNOSIS — D72829 Elevated white blood cell count, unspecified: Secondary | ICD-10-CM

## 2012-04-21 DIAGNOSIS — F32A Depression, unspecified: Secondary | ICD-10-CM

## 2012-04-21 DIAGNOSIS — G40409 Other generalized epilepsy and epileptic syndromes, not intractable, without status epilepticus: Secondary | ICD-10-CM | POA: Diagnosis present

## 2012-04-21 DIAGNOSIS — Z96649 Presence of unspecified artificial hip joint: Secondary | ICD-10-CM

## 2012-04-21 DIAGNOSIS — M1710 Unilateral primary osteoarthritis, unspecified knee: Secondary | ICD-10-CM | POA: Diagnosis present

## 2012-04-21 DIAGNOSIS — F329 Major depressive disorder, single episode, unspecified: Secondary | ICD-10-CM | POA: Diagnosis present

## 2012-04-21 DIAGNOSIS — R945 Abnormal results of liver function studies: Secondary | ICD-10-CM

## 2012-04-21 DIAGNOSIS — H8110 Benign paroxysmal vertigo, unspecified ear: Secondary | ICD-10-CM

## 2012-04-21 DIAGNOSIS — D649 Anemia, unspecified: Secondary | ICD-10-CM

## 2012-04-21 DIAGNOSIS — Z880 Allergy status to penicillin: Secondary | ICD-10-CM

## 2012-04-21 DIAGNOSIS — G2581 Restless legs syndrome: Secondary | ICD-10-CM | POA: Diagnosis present

## 2012-04-21 DIAGNOSIS — Z91040 Latex allergy status: Secondary | ICD-10-CM

## 2012-04-21 DIAGNOSIS — K5900 Constipation, unspecified: Secondary | ICD-10-CM

## 2012-04-21 HISTORY — DX: Depression, unspecified: F32.A

## 2012-04-21 HISTORY — DX: Unspecified osteoarthritis, unspecified site: M19.90

## 2012-04-21 HISTORY — DX: Pure hypercholesterolemia, unspecified: E78.00

## 2012-04-21 HISTORY — DX: Major depressive disorder, single episode, unspecified: F32.9

## 2012-04-21 HISTORY — DX: Unspecified chronic bronchitis: J42

## 2012-04-21 LAB — CBC WITH DIFFERENTIAL/PLATELET
Basophils Absolute: 0 10*3/uL (ref 0.0–0.1)
Basophils Relative: 0 % (ref 0–1)
Eosinophils Absolute: 0 10*3/uL (ref 0.0–0.7)
Eosinophils Relative: 0 % (ref 0–5)
HCT: 41 % (ref 36.0–46.0)
Hemoglobin: 14 g/dL (ref 12.0–15.0)
Lymphocytes Relative: 32 % (ref 12–46)
Lymphs Abs: 5.4 10*3/uL — ABNORMAL HIGH (ref 0.7–4.0)
MCH: 31 pg (ref 26.0–34.0)
MCHC: 34.1 g/dL (ref 30.0–36.0)
MCV: 90.7 fL (ref 78.0–100.0)
Monocytes Absolute: 1.3 10*3/uL — ABNORMAL HIGH (ref 0.1–1.0)
Monocytes Relative: 8 % (ref 3–12)
Neutro Abs: 9.9 10*3/uL — ABNORMAL HIGH (ref 1.7–7.7)
Neutrophils Relative %: 60 % (ref 43–77)
Platelets: 332 10*3/uL (ref 150–400)
RBC: 4.52 MIL/uL (ref 3.87–5.11)
RDW: 14 % (ref 11.5–15.5)
WBC: 16.6 10*3/uL — ABNORMAL HIGH (ref 4.0–10.5)

## 2012-04-21 LAB — BASIC METABOLIC PANEL
BUN: 10 mg/dL (ref 6–23)
CO2: 14 mEq/L — ABNORMAL LOW (ref 19–32)
Calcium: 9.9 mg/dL (ref 8.4–10.5)
Chloride: 107 mEq/L (ref 96–112)
Creatinine, Ser: 0.6 mg/dL (ref 0.50–1.10)
GFR calc Af Amer: >90 mL/min (ref >90)
GFR calc non Af Amer: >90 mL/min (ref >90)
Glucose, Bld: 116 mg/dL — ABNORMAL HIGH (ref 70–99)
Potassium: 3.2 mEq/L — ABNORMAL LOW (ref 3.5–5.1)
Sodium: 146 mEq/L — ABNORMAL HIGH (ref 135–145)

## 2012-04-21 LAB — GLUCOSE, CAPILLARY: Glucose-Capillary: 95 mg/dL (ref 70–99)

## 2012-04-21 MED ORDER — ACETAMINOPHEN 325 MG PO TABS
650.0000 mg | ORAL_TABLET | Freq: Once | ORAL | Status: AC
  Administered 2012-04-21: 650 mg via ORAL
  Filled 2012-04-21: qty 2

## 2012-04-21 MED ORDER — LORAZEPAM 2 MG/ML IJ SOLN
INTRAMUSCULAR | Status: AC
  Administered 2012-04-21: 1 mg via INTRAVENOUS
  Filled 2012-04-21: qty 1

## 2012-04-21 MED ORDER — LORAZEPAM 2 MG/ML IJ SOLN
1.0000 mg | Freq: Once | INTRAMUSCULAR | Status: AC
  Administered 2012-04-21: 1 mg via INTRAVENOUS

## 2012-04-21 NOTE — ED Notes (Signed)
Pt went with husband this am to have colonoscopy at [facility], while in waiting room waiting for him, she had seizure, she was carried to er.

## 2012-04-21 NOTE — ED Provider Notes (Signed)
History     CSN: [id number]  Arrival date & time 04/21/12  1842   First MD Initiated Contact with Patient 04/21/12 2051      Chief Complaint  Patient presents with  . Seizures    (Consider location/radiation/quality/duration/timing/severity/associated sxs/prior treatment) HPI Comments: Patient with a history of seizures presents with increased frequency of seizures today. Per the husband patient's had 3 or 4 seizures today. She was seen at [facility] this morning in the ED and was discharged to without any change in her medication. She subsequently has had 2 more seizures during the day and had another seizure while waiting here in the emergency department. He states that she had a recent fall with head injury about 2 weeks ago but otherwise has been acting okay. There's been no fevers chills or recent illnesses. No recent change in medication. She's followed by Dr. Haynes Bast neurology.  Patient is a 47 y.o. female presenting with seizures.  Seizures   Past Medical History  Diagnosis Date  . Epilepsy   . Seizures   . Vertigo   . Constipation   . Restless leg   . Arthritis     Past Surgical History  Procedure Laterality Date  . Total hip arthroplasty      Left Hip THR X 3  . Abdominal hysterectomy    . Colonoscopy    . Joint replacement    . Mass excision  10/22/2011    Procedure: EXCISION MASS;  Surgeon: Shelly Rubenstein, MD;  Location: [facility] OR;  Service: General;  Laterality: Right;  excision right buttock mass    Family History  Problem Relation Age of Onset  . Cancer Mother   . Cancer Brother   . Diabetes Brother   . Cancer Maternal Aunt   . Diabetes Maternal Aunt     History  Substance Use Topics  . Smoking status: Never Smoker   . Smokeless tobacco: Never Used  . Alcohol Use: No    OB History   Grav Para Term Preterm Abortions TAB SAB Ect Mult Living                  Review of Systems  Constitutional: Negative for fever, chills,  diaphoresis and fatigue.  HENT: Negative for congestion, rhinorrhea and sneezing.   Eyes: Negative.   Respiratory: Negative for cough, chest tightness and shortness of breath.   Cardiovascular: Negative for chest pain and leg swelling.  Gastrointestinal: Negative for nausea, vomiting, abdominal pain, diarrhea and blood in stool.  Genitourinary: Negative for frequency, hematuria, flank pain and difficulty urinating.  Musculoskeletal: Negative for back pain and arthralgias.  Skin: Negative for rash.  Neurological: Positive for seizures and headaches. Negative for dizziness, speech difficulty, weakness and numbness.    Allergies  Benadryl; Betadine; Codeine; Dilantin; Melatonin; Ultram; Vicodin; Latex; and Penicillins  Home Medications   Current Outpatient Rx  Name  Route  Sig  Dispense  Refill  . clonazePAM (KLONOPIN) 0.5 MG tablet   Oral   Take 0.5 mg by mouth 3 (three) times daily.         Marland Kitchen FeFum-FePoly-FA-B Cmp-C-Biot (FOLIVANE-PLUS) CAPS   Oral   Take 1 capsule by mouth daily.         Marland Kitchen FLUoxetine (PROZAC) 20 MG capsule   Oral   Take 20 mg by mouth daily.         Marland Kitchen gabapentin (NEURONTIN) 600 MG tablet   Oral   Take 600 mg by mouth 4 (  four) times daily.         Marland Kitchen ibuprofen (ADVIL) 200 MG tablet   Oral   Take 3 tablets (600 mg total) by mouth every 6 (six) hours as needed for pain.   30 tablet   0   . Lacosamide (VIMPAT) 100 MG TABS   Oral   Take 200 mg by mouth 2 (two) times daily. Takes at 8 AM and 8 PM           BP 115/76  Pulse 81  Temp(Src) 99.3 F (37.4 C) (Oral)  Resp 18  Ht 5' (1.524 m)  Wt 165 lb (74.844 kg)  BMI 32.22 kg/m2  SpO2 95%  Physical Exam  Constitutional: She is oriented to person, place, and time. She appears well-developed and well-nourished.  HENT:  Head: Normocephalic and atraumatic.  Eyes: Pupils are equal, round, and reactive to light.  Neck: Normal range of motion. Neck supple.  Cardiovascular: Normal rate, regular  rhythm and normal heart sounds.   Pulmonary/Chest: Effort normal and breath sounds normal. No respiratory distress. She has no wheezes. She has no rales. She exhibits no tenderness.  Abdominal: Soft. Bowel sounds are normal. There is no tenderness. There is no rebound and no guarding.  Musculoskeletal: Normal range of motion. She exhibits no edema.  Lymphadenopathy:    She has no cervical adenopathy.  Neurological: She is alert and oriented to person, place, and time. She has normal strength. No cranial nerve deficit or sensory deficit.  Skin: Skin is warm and dry. No rash noted.  Psychiatric: She has a normal mood and affect.    ED Course  Procedures (including critical care time)  Results for orders placed during the hospital encounter of 04/21/12  CBC WITH DIFFERENTIAL      Result Value Range   WBC 16.6 (*) 4.0 - 10.5 K/uL   RBC 4.52  3.87 - 5.11 MIL/uL   Hemoglobin 14.0  12.0 - 15.0 g/dL   HCT 16.1  09.6 - 04.5 %   MCV 90.7  78.0 - 100.0 fL   MCH 31.0  26.0 - 34.0 pg   MCHC 34.1  30.0 - 36.0 g/dL   RDW 40.9  81.1 - 91.4 %   Platelets 332  150 - 400 K/uL   Neutrophils Relative 60  43 - 77 %   Neutro Abs 9.9 (*) 1.7 - 7.7 K/uL   Lymphocytes Relative 32  12 - 46 %   Lymphs Abs 5.4 (*) 0.7 - 4.0 K/uL   Monocytes Relative 8  3 - 12 %   Monocytes Absolute 1.3 (*) 0.1 - 1.0 K/uL   Eosinophils Relative 0  0 - 5 %   Eosinophils Absolute 0.0  0.0 - 0.7 K/uL   Basophils Relative 0  0 - 1 %   Basophils Absolute 0.0  0.0 - 0.1 K/uL  BASIC METABOLIC PANEL      Result Value Range   Sodium 146 (*) 135 - 145 mEq/L   Potassium 3.2 (*) 3.5 - 5.1 mEq/L   Chloride 107  96 - 112 mEq/L   CO2 14 (*) 19 - 32 mEq/L   Glucose, Bld 116 (*) 70 - 99 mg/dL   BUN 10  6 - 23 mg/dL   Creatinine, Ser 7.82  0.50 - 1.10 mg/dL   Calcium 9.9  8.4 - 95.6 mg/dL   GFR calc non Af Amer >90  >90 mL/min   GFR calc Af Amer >90  >90 mL/min  GLUCOSE, CAPILLARY  Result Value Range   Glucose-Capillary 95  70  - 99 mg/dL   Ct Head Wo Contrast  04/21/2012  *RADIOLOGY REPORT*  Clinical Data: Head injury and seizure.  CT HEAD WITHOUT CONTRAST  Technique:  Contiguous axial images were obtained from the base of the skull through the vertex without contrast.  Comparison: CT head without contrast 10/11/2011.  Findings: No acute cortical infarct, hemorrhage, or mass lesion is present.  No significant extra-axial fluid collection is present. The osseous skull is intact.  The paranasal sinuses and mastoid air cells are clear.  IMPRESSION: Negative CT head.   Original Report Authenticated By: Marin Roberts, M.D.       1. Seizure       MDM  Patient had another tonic-clonic generalized type seizure while I was in the room with her. It lasted approximately 1-2 minutes. She was postictal afterward. She's currently back at her baseline. Given multiple seizures today I felt that she likely will need to be admitted. I spoke with the neurologist on call who recommended loading her with either Dilantin or IV Keppra. She is allergic to Dilantin and we currently do not have any In the ED at [facility]. We will transfer her to [facility]. She was given dose of Ativan in the ED.        Rolan Bucco, MD 04/21/12 2256

## 2012-04-21 NOTE — Telephone Encounter (Signed)
LMOM

## 2012-04-21 NOTE — ED Notes (Signed)
Pt family sts pt has been having seizures today and was at [facility] this morning. Pt is on medication for same and [facility] treats her.

## 2012-04-21 NOTE — ED Notes (Signed)
Report provided to [facility] 

## 2012-04-21 NOTE — ED Notes (Signed)
Pt's last seizure per pt was 1700, followed by [name], last saw him a month ago, husband states that when he came out of colonoscopy pt was waiting in waiting room with paper work from ed,

## 2012-04-22 ENCOUNTER — Encounter (HOSPITAL_COMMUNITY): Payer: Self-pay | Admitting: General Practice

## 2012-04-22 DIAGNOSIS — G40309 Generalized idiopathic epilepsy and epileptic syndromes, not intractable, without status epilepticus: Secondary | ICD-10-CM

## 2012-04-22 DIAGNOSIS — R569 Unspecified convulsions: Secondary | ICD-10-CM

## 2012-04-22 DIAGNOSIS — G40409 Other generalized epilepsy and epileptic syndromes, not intractable, without status epilepticus: Secondary | ICD-10-CM | POA: Diagnosis present

## 2012-04-22 DIAGNOSIS — E876 Hypokalemia: Secondary | ICD-10-CM

## 2012-04-22 LAB — CBC
HCT: 35.9 % — ABNORMAL LOW (ref 36.0–46.0)
Hemoglobin: 12.5 g/dL (ref 12.0–15.0)
MCH: 30.3 pg (ref 26.0–34.0)
MCHC: 34.8 g/dL (ref 30.0–36.0)
MCV: 87.1 fL (ref 78.0–100.0)
Platelets: 294 10*3/uL (ref 150–400)
RBC: 4.12 MIL/uL (ref 3.87–5.11)
RDW: 13.7 % (ref 11.5–15.5)
WBC: 9.9 10*3/uL (ref 4.0–10.5)

## 2012-04-22 LAB — MAGNESIUM: Magnesium: 2.4 mg/dL (ref 1.5–2.5)

## 2012-04-22 LAB — BASIC METABOLIC PANEL
BUN: 11 mg/dL (ref 6–23)
CO2: 21 mEq/L (ref 19–32)
Calcium: 8.9 mg/dL (ref 8.4–10.5)
Chloride: 109 mEq/L (ref 96–112)
Creatinine, Ser: 0.62 mg/dL (ref 0.50–1.10)
GFR calc Af Amer: >90 mL/min (ref >90)
GFR calc non Af Amer: >90 mL/min (ref >90)
Glucose, Bld: 100 mg/dL — ABNORMAL HIGH (ref 70–99)
Potassium: 3.6 mEq/L (ref 3.5–5.1)
Sodium: 140 mEq/L (ref 135–145)

## 2012-04-22 MED ORDER — SODIUM CHLORIDE 0.9 % IV SOLN
1000.0000 mg | Freq: Once | INTRAVENOUS | Status: AC
  Administered 2012-04-22: 1000 mg via INTRAVENOUS
  Filled 2012-04-22: qty 10

## 2012-04-22 MED ORDER — POTASSIUM CHLORIDE CRYS ER 20 MEQ PO TBCR
30.0000 meq | EXTENDED_RELEASE_TABLET | Freq: Once | ORAL | Status: AC
  Administered 2012-04-22: 30 meq via ORAL
  Filled 2012-04-22: qty 1

## 2012-04-22 MED ORDER — GABAPENTIN 600 MG PO TABS
600.0000 mg | ORAL_TABLET | Freq: Three times a day (TID) | ORAL | Status: DC
  Administered 2012-04-22 – 2012-04-23 (×7): 600 mg via ORAL
  Filled 2012-04-22 (×9): qty 1

## 2012-04-22 MED ORDER — PANTOPRAZOLE SODIUM 40 MG PO TBEC
40.0000 mg | DELAYED_RELEASE_TABLET | Freq: Every day | ORAL | Status: DC
  Administered 2012-04-22 – 2012-04-23 (×2): 40 mg via ORAL
  Filled 2012-04-22 (×2): qty 1

## 2012-04-22 MED ORDER — CLONAZEPAM 0.5 MG PO TABS
0.5000 mg | ORAL_TABLET | Freq: Two times a day (BID) | ORAL | Status: DC | PRN
Start: 2012-04-22 — End: 2012-04-23

## 2012-04-22 MED ORDER — ENOXAPARIN SODIUM 40 MG/0.4ML ~~LOC~~ SOLN
40.0000 mg | Freq: Every day | SUBCUTANEOUS | Status: DC
  Administered 2012-04-22: 40 mg via SUBCUTANEOUS
  Filled 2012-04-22 (×2): qty 0.4

## 2012-04-22 MED ORDER — IBUPROFEN 600 MG PO TABS
600.0000 mg | ORAL_TABLET | Freq: Four times a day (QID) | ORAL | Status: DC | PRN
  Administered 2012-04-22 (×2): 600 mg via ORAL
  Filled 2012-04-22 (×3): qty 1

## 2012-04-22 MED ORDER — ACETAMINOPHEN 325 MG PO TABS
650.0000 mg | ORAL_TABLET | ORAL | Status: DC | PRN
  Administered 2012-04-22 – 2012-04-23 (×4): 650 mg via ORAL
  Filled 2012-04-22 (×4): qty 2

## 2012-04-22 MED ORDER — LAMOTRIGINE 100 MG PO TABS
100.0000 mg | ORAL_TABLET | Freq: Every day | ORAL | Status: DC
  Administered 2012-04-22 – 2012-04-23 (×2): 100 mg via ORAL
  Filled 2012-04-22 (×2): qty 1

## 2012-04-22 MED ORDER — INSULIN ASPART 100 UNIT/ML ~~LOC~~ SOLN: 0.0000 [IU] | Freq: Every day | SUBCUTANEOUS | Status: DC

## 2012-04-22 MED ORDER — INSULIN ASPART 100 UNIT/ML ~~LOC~~ SOLN: 0.0000 [IU] | Freq: Three times a day (TID) | SUBCUTANEOUS | Status: DC

## 2012-04-22 MED ORDER — LORAZEPAM 2 MG/ML IJ SOLN: 1.0000 mg | INTRAMUSCULAR | Status: DC | PRN

## 2012-04-22 MED ORDER — FLUOXETINE HCL 20 MG PO CAPS
20.0000 mg | ORAL_CAPSULE | Freq: Every day | ORAL | Status: DC
  Administered 2012-04-22 – 2012-04-23 (×2): 20 mg via ORAL
  Filled 2012-04-22 (×2): qty 1

## 2012-04-22 MED ORDER — LACOSAMIDE 50 MG PO TABS
200.0000 mg | ORAL_TABLET | Freq: Two times a day (BID) | ORAL | Status: DC
  Administered 2012-04-22 – 2012-04-23 (×3): 200 mg via ORAL
  Filled 2012-04-22 (×3): qty 4

## 2012-04-22 MED ORDER — LAMOTRIGINE 25 MG PO TABS
50.0000 mg | ORAL_TABLET | Freq: Every day | ORAL | Status: DC
  Administered 2012-04-22: 50 mg via ORAL
  Filled 2012-04-22 (×2): qty 2

## 2012-04-22 NOTE — Progress Notes (Signed)
Called to verify seizure meds with neuro. Home med Vimpat 200mg  po bid ordered. Pt takes Lamictal at home but dose discrepancy in chart. So, Lamictal 100mg  am and 50mg  pm ordered. Neuro said that was fine and will sort it out later since pt is now on Keppra as well.  Jimmye Norman, NP [facility]

## 2012-04-22 NOTE — Consult Note (Signed)
NEURO HOSPITALIST CONSULT NOTE    Reason for Consult: seizures.  HPI:                                                                                                                                          [name] is an 47 y.o. female transferred to [facility] for further seizures treatment. 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 She is currently on Vimpat 200 mg BID and Lamictal 100-150 mg BID. Stated that Dilantin caused liver problems in the past. [name] indicated that she has been experiencing an increase in seizure frequency since yesterday, with a total of 3 seizures total, the last one about 9:30 pm last night. She was taken to [facility] hospital this morning after sustaining a seizure, was discharged home without changes in her anti-seizure medications. She had a witnessed seizure in the ED at [name] that was described as a tonic clonic seizure lasting for about 30 seconds. Received IV ativan and was transferred to [facility] for further treatment. No further seizures reported since her evaluation at [facility] point. Currently receiving a loading dose 1 gram IV keppra. She is alert and able to have a normal conversation. Complains of headache, but denies vertigo, double vision, difficulty swallowing, focal weakness or numbness, imbalance, slurred speech, language or vision impairment. No recent fever, infection, or severe head injury. Of importance, she said that she many times forgets to take her seizure medications and is unsure if she took it in the past few days.      Past Medical History  Diagnosis Date  . Epilepsy   . Seizures   . Vertigo   . Constipation   . Restless leg   . Arthritis     Past Surgical History  Procedure Laterality Date   . Total hip arthroplasty      Left Hip THR X 3  . Abdominal hysterectomy    . Colonoscopy    . Joint replacement    . Mass excision  10/22/2011    Procedure: EXCISION MASS;  Surgeon: Shelly Rubenstein, MD;  Location: [facility] OR;  Service: General;  Laterality: Right;  excision right buttock mass    Family History  Problem Relation Age of Onset  . Cancer Mother   . Cancer Brother   . Diabetes Brother   . Cancer Maternal Aunt   . Diabetes Maternal Aunt     Family History:   Social History:  reports that she has never smoked. She has never used smokeless tobacco. She reports that she does not drink alcohol or use illicit drugs.  Allergies  Allergen Reactions  . Benadryl (Diphenhydramine Hcl) Other (See Comments)    seizures  . Betadine (Povidone Iodine)     rash  . Codeine     rash  . Dilantin (Phenytoin Sodium Extended)     Elevated LFT's  . Melatonin Other (See Comments)    Not sure  . Ultram (Tramadol Hcl) Other (See Comments)    Reaction:Seizures  . Vicodin (Hydrocodone-Acetaminophen) Itching  . Latex Rash  . Penicillins Itching and Rash    MEDICATIONS:                                                                                                                     I have reviewed the patient's current medications.   ROS:                                                                                                                                       History obtained from the patient and chart review.  General ROS: negative for - chills, fatigue, fever, night sweats, weight gain or weight loss Psychological ROS: negative for - behavioral disorder, hallucinations, memory difficulties, mood swings or suicidal ideation Ophthalmic ROS: negative for - blurry vision, double vision, eye pain or loss of vision ENT ROS: negative for - epistaxis, nasal discharge, oral lesions, sore throat, tinnitus or vertigo Allergy and Immunology ROS: negative for - hives or  itchy/watery eyes Hematological and Lymphatic ROS: negative for - bleeding problems, bruising or swollen lymph nodes Endocrine ROS: negative for - galactorrhea, hair pattern changes, polydipsia/polyuria or temperature intolerance Respiratory ROS: negative for - cough, hemoptysis, shortness of breath or wheezing Cardiovascular ROS: negative for - chest pain, dyspnea on exertion, edema or irregular heartbeat Gastrointestinal ROS: negative for - abdominal pain, diarrhea, hematemesis, nausea/vomiting or stool incontinence Genito-Urinary ROS: negative for - dysuria, hematuria, incontinence or urinary frequency/urgency Musculoskeletal ROS: negative for - joint swelling or muscular weakness Neurological ROS: as noted in HPI Dermatological ROS: negative for rash and skin lesion changes     Physical exam: pleasant female in no apparent distress. Blood pressure 115/76, pulse 79, temperature 98.7 F (37.1 C), temperature source Oral, resp. rate 22, height 5' (1.524 m), weight 74.844 kg (165 lb), SpO2 97.00%. Head: normocephalic. Neck: supple, no bruits, no JVD. Cardiac: no murmurs. Lungs: clear. Abdomen: soft, no tender, no mass. Extremities: no edema.   Neurologic Examination:  Mental Status: Alert, awake, oriented to place, year, person, and situation, thought content appropriate.  Speech fluent without evidence of aphasia.  Able to follow 3 step commands without difficulty. Cranial Nerves: II: Discs flat bilaterally; Visual fields grossly normal, pupils equal, round, reactive to light and accommodation III,IV, VI: ptosis not present, extra-ocular motions intact bilaterally V,VII: smile symmetric, facial light touch sensation normal bilaterally VIII: hearing normal bilaterally IX,X: gag reflex present XI: bilateral shoulder shrug XII: midline tongue extension Motor: Right : Upper extremity    5/5    Left:     Upper extremity   5/5  Lower extremity   5/5     Lower extremity   5/5 Tone and bulk:normal tone throughout; no atrophy noted Sensory: Pinprick and light touch intact throughout, bilaterally Deep Tendon Reflexes: 2+ and symmetric throughout Plantars: Right: downgoing   Left: downgoing Cerebellar: normal finger-to-nose,  normal heel-to-shin test Gait: no ataxia. CV: pulses palpable throughout    No results found for this basename: cbc, bmp, coags, chol, tri, ldl, hga1c    Results for orders placed during the hospital encounter of 04/21/12 (from the past 48 hour(s))  CBC WITH DIFFERENTIAL     Status: Abnormal   Collection Time    04/21/12  9:18 PM      Result Value Range   WBC 16.6 (*) 4.0 - 10.5 K/uL   RBC 4.52  3.87 - 5.11 MIL/uL   Hemoglobin 14.0  12.0 - 15.0 g/dL   HCT 84.1  32.4 - 40.1 %   MCV 90.7  78.0 - 100.0 fL   MCH 31.0  26.0 - 34.0 pg   MCHC 34.1  30.0 - 36.0 g/dL   RDW 02.7  25.3 - 66.4 %   Platelets 332  150 - 400 K/uL   Neutrophils Relative 60  43 - 77 %   Neutro Abs 9.9 (*) 1.7 - 7.7 K/uL   Lymphocytes Relative 32  12 - 46 %   Lymphs Abs 5.4 (*) 0.7 - 4.0 K/uL   Monocytes Relative 8  3 - 12 %   Monocytes Absolute 1.3 (*) 0.1 - 1.0 K/uL   Eosinophils Relative 0  0 - 5 %   Eosinophils Absolute 0.0  0.0 - 0.7 K/uL   Basophils Relative 0  0 - 1 %   Basophils Absolute 0.0  0.0 - 0.1 K/uL  BASIC METABOLIC PANEL     Status: Abnormal   Collection Time    04/21/12  9:18 PM      Result Value Range   Sodium 146 (*) 135 - 145 mEq/L   Potassium 3.2 (*) 3.5 - 5.1 mEq/L   Chloride 107  96 - 112 mEq/L   CO2 14 (*) 19 - 32 mEq/L   Glucose, Bld 116 (*) 70 - 99 mg/dL   BUN 10  6 - 23 mg/dL   Creatinine, Ser 4.03  0.50 - 1.10 mg/dL   Calcium 9.9  8.4 - 47.4 mg/dL   GFR calc non Af Amer >90  >90 mL/min   GFR calc Af Amer >90  >90 mL/min   Comment:            The eGFR has been calculated     using the CKD EPI equation.     This calculation has not  been     validated in all clinical     situations.     eGFR's persistently     <90 mL/min signify     possible  Chronic Kidney Disease.  GLUCOSE, CAPILLARY     Status: None   Collection Time    04/21/12 10:01 PM      Result Value Range   Glucose-Capillary 95  70 - 99 mg/dL    Ct Head Wo Contrast  04/21/2012  *RADIOLOGY REPORT*  Clinical Data: Head injury and seizure.  CT HEAD WITHOUT CONTRAST  Technique:  Contiguous axial images were obtained from the base of the skull through the vertex without contrast.  Comparison: CT head without contrast 10/11/2011.  Findings: No acute cortical infarct, hemorrhage, or mass lesion is present.  No significant extra-axial fluid collection is present. The osseous skull is intact.  The paranasal sinuses and mastoid air cells are clear.  IMPRESSION: Negative CT head.   Original Report Authenticated By: Marin Roberts, M.D.      Assessment/Plan: 47 years old female with video-EEG confirmed mixed seizure disorder ( GTC epilepsy and non epileptic seizures), poorly adherent to treatment, brought to [facility] with a cluster of seizures. Her mental status and neuro-exam are unremarkable. CT head unimpressive. No recent fever, systemic illnesses, or new medications, but unclear whether or not she has been taking her anticonvulsants. In addition, it is unknown if her current paroxysmal events are non epileptic seizures or GTC seizures. In any case, she is currently receiving a loading dose of keppra and will maintain her on keppra 500 mg BID.   Wyatt Portela ,MD [facility] [phone]  04/22/2012, 1:17 AM

## 2012-04-22 NOTE — H&P (Signed)
patient have been seen and examined by me. Agree with above note and plan will add Keppra 500 mg BID, Neurology is aware and following.  Hypokalemia - has been replaced follow up K level and magnesium  Stephanie Mcglone 6:30 AM

## 2012-04-22 NOTE — H&P (Signed)
[name] is an 47 y.o. female.   Chief Complaint: seizure activity HPI: [name] is a 47 yo WF with a hx of generalized tonic-clonic epilepsy and non-epilepsy seizures, osteoarthritis, depression, constipation, vertigo, and headaches. She has had seizures since 2002 verified by video EEG at [facility]. She presented to an outside ED yesterday after having a witnessed tonic-clonic seizure lasting 30 seconds at another facility. Unfortunately, Keppra was not available there, so pt was sent to [facility] for further evaluation and treatment. She normally takes Vimpat 200mg  po bid and Lamictal 100mg  am and 50mg  pm. She was on Dilantin in the past, but had liver issues with that so it was discontinued.  Patient reports 3 seizures since 4//8/14. She admits that she sometimes forgets to take her meds and may have missed some doses in past few days.  Therefore, secondary to uncontrolled epilepsy, [facility] are asked to admit patient for further evaluation and treatment. At present, neurology has already seen the pt and written a consult note.   Past Medical History  Diagnosis Date  . Epilepsy   . Seizures   . Vertigo   . Constipation   . Restless leg   . Arthritis     Past Surgical History  Procedure Laterality Date  . Total hip arthroplasty      Left Hip THR X 3  . Abdominal hysterectomy    . Colonoscopy    . Joint replacement    . Mass excision  10/22/2011    Procedure: EXCISION MASS;  Surgeon: Shelly Rubenstein, MD;  Location: [facility] OR;  Service: General;  Laterality: Right;  excision right buttock mass    Family History  Problem Relation Age of Onset  . Cancer Mother   . Cancer Brother   . Diabetes Brother   . Cancer Maternal Aunt   . Diabetes Maternal Aunt    Social History:  reports that she has never smoked. She has never used smokeless tobacco. She reports that she does not drink alcohol or use illicit drugs.  Allergies:  Allergies  Allergen Reactions  . Benadryl  (Diphenhydramine Hcl) Other (See Comments)    seizures  . Betadine (Povidone Iodine)     rash  . Codeine     rash  . Dilantin (Phenytoin Sodium Extended)     Elevated LFT's  . Melatonin Other (See Comments)    Not sure  . Ultram (Tramadol Hcl) Other (See Comments)    Reaction:Seizures  . Vicodin (Hydrocodone-Acetaminophen) Itching  . Latex Rash  . Penicillins Itching and Rash    Medications Prior to Admission  Medication Sig Dispense Refill  . clonazePAM (KLONOPIN) 0.5 MG tablet Take 0.5 mg by mouth 3 (three) times daily.      Marland Kitchen FeFum-FePoly-FA-B Cmp-C-Biot (FOLIVANE-PLUS) CAPS Take 1 capsule by mouth daily.      Marland Kitchen FLUoxetine (PROZAC) 20 MG capsule Take 20 mg by mouth daily.      Marland Kitchen gabapentin (NEURONTIN) 600 MG tablet Take 600 mg by mouth 4 (four) times daily.      Marland Kitchen ibuprofen (ADVIL) 200 MG tablet Take 3 tablets (600 mg total) by mouth every 6 (six) hours as needed for pain.  30 tablet  0  . Lacosamide (VIMPAT) 100 MG TABS Take 200 mg by mouth 2 (two) times daily. Takes at 8 AM and 8 PM      . lamoTRIgine (LAMICTAL) 100 MG tablet 100 mg. Take 100 mg by mouth every morning. And 1/2 tablet (50 mg) every  day at 4:00 pm        Results for orders placed during the hospital encounter of 04/21/12 (from the past 48 hour(s))  CBC WITH DIFFERENTIAL     Status: Abnormal   Collection Time    04/21/12  9:18 PM      Result Value Range   WBC 16.6 (*) 4.0 - 10.5 K/uL   RBC 4.52  3.87 - 5.11 MIL/uL   Hemoglobin 14.0  12.0 - 15.0 g/dL   HCT 16.1  09.6 - 04.5 %   MCV 90.7  78.0 - 100.0 fL   MCH 31.0  26.0 - 34.0 pg   MCHC 34.1  30.0 - 36.0 g/dL   RDW 40.9  81.1 - 91.4 %   Platelets 332  150 - 400 K/uL   Neutrophils Relative 60  43 - 77 %   Neutro Abs 9.9 (*) 1.7 - 7.7 K/uL   Lymphocytes Relative 32  12 - 46 %   Lymphs Abs 5.4 (*) 0.7 - 4.0 K/uL   Monocytes Relative 8  3 - 12 %   Monocytes Absolute 1.3 (*) 0.1 - 1.0 K/uL   Eosinophils Relative 0  0 - 5 %   Eosinophils Absolute 0.0  0.0  - 0.7 K/uL   Basophils Relative 0  0 - 1 %   Basophils Absolute 0.0  0.0 - 0.1 K/uL  BASIC METABOLIC PANEL     Status: Abnormal   Collection Time    04/21/12  9:18 PM      Result Value Range   Sodium 146 (*) 135 - 145 mEq/L   Potassium 3.2 (*) 3.5 - 5.1 mEq/L   Chloride 107  96 - 112 mEq/L   CO2 14 (*) 19 - 32 mEq/L   Glucose, Bld 116 (*) 70 - 99 mg/dL   BUN 10  6 - 23 mg/dL   Creatinine, Ser 7.82  0.50 - 1.10 mg/dL   Calcium 9.9  8.4 - 95.6 mg/dL   GFR calc non Af Amer >90  >90 mL/min   GFR calc Af Amer >90  >90 mL/min   Comment:            The eGFR has been calculated     using the CKD EPI equation.     This calculation has not been     validated in all clinical     situations.     eGFR's persistently     <90 mL/min signify     possible Chronic Kidney Disease.  GLUCOSE, CAPILLARY     Status: None   Collection Time    04/21/12 10:01 PM      Result Value Range   Glucose-Capillary 95  70 - 99 mg/dL   Ct Head Wo Contrast  04/21/2012  *RADIOLOGY REPORT*  Clinical Data: Head injury and seizure.  CT HEAD WITHOUT CONTRAST  Technique:  Contiguous axial images were obtained from the base of the skull through the vertex without contrast.  Comparison: CT head without contrast 10/11/2011.  Findings: No acute cortical infarct, hemorrhage, or mass lesion is present.  No significant extra-axial fluid collection is present. The osseous skull is intact.  The paranasal sinuses and mastoid air cells are clear.  IMPRESSION: Negative CT head.   Original Report Authenticated By: Marin Roberts, M.D.     Review of Systems  Constitutional: Negative for fever, chills, weight loss, malaise/fatigue and diaphoresis.  HENT: Negative for hearing loss, congestion, sore throat, neck pain and tinnitus.   Eyes:  Negative for blurred vision, double vision, photophobia, pain, discharge and redness.  Respiratory: Negative for cough, sputum production, shortness of breath and wheezing.   Cardiovascular:  Negative for chest pain, palpitations, orthopnea and leg swelling.  Gastrointestinal: Negative for heartburn, nausea, vomiting, abdominal pain, diarrhea, constipation and blood in stool.  Genitourinary: Negative for dysuria.  Musculoskeletal: Negative for myalgias, back pain, joint pain and falls.  Skin: Negative for itching and rash.  Neurological: Positive for seizures, weakness (after seizures) and headaches. Negative for dizziness, tingling, tremors, sensory change, speech change and focal weakness.  Endo/Heme/Allergies: Negative for environmental allergies and polydipsia. Does not bruise/bleed easily.  Psychiatric/Behavioral: Negative for depression, suicidal ideas and substance abuse.    Blood pressure 105/66, pulse 70, temperature 98.7 F (37.1 C), temperature source Oral, resp. rate 20, height 5' (1.524 m), weight 73.619 kg (162 lb 4.8 oz), SpO2 100.00%. Physical Exam  Constitutional: She appears well-developed and well-nourished. She is cooperative. She is easily aroused.  Non-toxic appearance. She does not have a sickly appearance. She does not appear ill. No distress.  HENT:  Head: Not macrocephalic and not microcephalic. Head is without abrasion, without contusion, without laceration, without right periorbital erythema and without left periorbital erythema.  Eyes: Conjunctivae and lids are normal. Pupils are equal, round, and reactive to light.  Neck: Trachea normal and normal range of motion. Neck supple. No JVD present. Carotid bruit is not present. No rigidity.  Cardiovascular: Normal rate, regular rhythm, S1 normal and S2 normal.  Exam reveals no gallop and no friction rub.   No murmur heard. Respiratory: Breath sounds normal. No respiratory distress.  GI: Soft. There is no tenderness.  Musculoskeletal: Normal range of motion.  Lymphadenopathy:    She has no cervical adenopathy.  Neurological: She is alert and easily aroused. She is not disoriented.  See neurology consult  for complete neuro exam  Skin: Skin is warm, dry and intact. She is not diaphoretic. No pallor.  Psychiatric: She has a normal mood and affect. Her speech is normal and behavior is normal.     Assessment/Plan 1. Hx of epilepsy and non-epilepsy seizures first diagnosed at [facility] in 2002. There seems to be a component of non compliance with medication which led to reported seizures. At this time, pt has received loading dose of Keppra and will be placed on 500mg  po bid as recommended by neuro. Ativan prn for active seizure activity. Neuro didn't say whether to continue her other epileptic meds, will clarify.  2. ? Med issues-SW consult for med assistance.  3. Depression dx'd at outside hospital-stable 4. Headaches-takes Ibuprofen at home.  5. Hx drug induced hepatic toxicity with Dilantin 6. Prophylaxis-Lovenox, Protonix.  7. Hypokalemia-mild, will replete. 8. Leukocytosis-has hx of this in past. No signs of infection but will get a UA. R/P CBC 04/23/12.   Admit to inpatient tele. Other plan per orders Note to be co-signed by Dr. Adela Glimpse. Appreciate neuro assistance.   KIRBY-GRAHAM, Ziyon Cedotal 04/22/2012, 4:15 AM

## 2012-04-22 NOTE — Progress Notes (Signed)
[facility] PROGRESS NOTE  [name] ZOX:[id number] DOB: January 06, 1966 DOA: 04/21/2012 PCP: Rudi Heap, MD  Assessment/Plan: Active Problems:   Hypokalemia   Seizure disorder   Epilepsy, grand mal    1. Seizure disorder:  Patient has known history of epilepsy and non-epilepsy seizures first diagnosed at [facility] in 2002. She appears to be non- compliant with medication, which has resulted in recurrent seizures. Dr Wyatt Portela provided neurology consultation, patient received a  loading dose of Keppra and then, commenced on 500mg  po bid. Managing otherwise as recommended, and patient has remained seizure-free today.  2. Depression: Clinically stable at this time.  3. Headaches:  Not problematic on Ibuprofen and prn Tylenol.  4. Hypokalemia: Mild. Repleting as indicated.  5. Leukocytosis; Patient had wcc of 16.6 at presentation, likely due to seizure activity, as there appears to be no focus of infection, and she is apyrexia. Today, wcc has normalized at 9.9.   Code Status: Full Code.  Family Communication:  Disposition Plan: To be determined.    Brief narrative: 47 yo female, with history of seizures disorder, osteoarthritis, s/p left TKA, depression, constipation, vertigo, and headaches, RLS. She has had seizures since 2002 verified by video EEG at [facility]. She presented to an outside ED yesterday after having a witnessed tonic-clonic seizure lasting 30 seconds at another facility. Unfortunately, Keppra was not available there, so pt was sent to [facility] for further evaluation and treatment. She normally takes Vimpat 200mg  po bid and Lamictal 100mg  am and 50mg  pm. She was on Dilantin in the past, but had liver issues with that so it was discontinued. Patient reports 3 seizures since 4//8/14. She admits that she sometimes forgets to take her meds and may have missed some doses in past few days. Admitted for further management.   Consultants:  Dr Wyatt Portela,  neurologist  Procedures:  Head CT scan  Antibiotics:  N/A.   HPI/Subjective: No new seizures documented today.   Objective: Vital signs in last 24 hours: Temp:  [98.7 F (37.1 C)-99.3 F (37.4 C)] 98.7 F (37.1 C) (04/10 0343) Pulse Rate:  [70-82] 70 (04/10 0343) Resp:  [18-22] 20 (04/10 0040) BP: (102-115)/(62-76) 105/66 mmHg (04/10 0343) SpO2:  [95 %-100 %] 100 % (04/10 0343) Weight:  [73.619 kg (162 lb 4.8 oz)-74.844 kg (165 lb)] 73.619 kg (162 lb 4.8 oz) (04/10 0040) Weight change:  Last BM Date: 04/21/12  Intake/Output from previous day: 04/09 0701 - 04/10 0700 In: 110 [IV Piggyback:110] Out: -      Physical Exam: General: Comfortable, alert, communicative, fully oriented, not short of breath at rest.  HEENT:  No clinical pallor, no jaundice, no conjunctival injection or discharge. Hydration is fair.  NECK:  Supple, JVP not seen, no carotid bruits, no palpable lymphadenopathy, no palpable goiter. CHEST:  Clinically clear to auscultation, no wheezes, no crackles. HEART:  Sounds 1 and 2 heard, normal, regular, no murmurs. ABDOMEN:  Full, soft, non-tender, no palpable organomegaly, no palpable masses, normal bowel sounds. GENITALIA:  Not examined. LOWER EXTREMITIES:  No pitting edema, palpable peripheral pulses. MUSCULOSKELETAL SYSTEM:  Unremarkable. CENTRAL NERVOUS SYSTEM:  No focal neurologic deficit on gross examination.  Lab Results:  Recent Labs  04/21/12 2118 04/22/12 0554  WBC 16.6* 9.9  HGB 14.0 12.5  HCT 41.0 35.9*  PLT 332 294    Recent Labs  04/21/12 2118  NA 146*  K 3.2*  CL 107  CO2 14*  GLUCOSE 116*  BUN 10  CREATININE 0.60  CALCIUM  9.9   No results found for this or any previous visit (from the past 240 hour(s)).   Studies/Results: Ct Head Wo Contrast  04/21/2012  *RADIOLOGY REPORT*  Clinical Data: Head injury and seizure.  CT HEAD WITHOUT CONTRAST  Technique:  Contiguous axial images were obtained from the base of the skull  through the vertex without contrast.  Comparison: CT head without contrast 10/11/2011.  Findings: No acute cortical infarct, hemorrhage, or mass lesion is present.  No significant extra-axial fluid collection is present. The osseous skull is intact.  The paranasal sinuses and mastoid air cells are clear.  IMPRESSION: Negative CT head.   Original Report Authenticated By: Marin Roberts, M.D.     Medications: Scheduled Meds: . enoxaparin (LOVENOX) injection  40 mg Subcutaneous Daily  . FLUoxetine  20 mg Oral Daily  . gabapentin  600 mg Oral TID AC & HS  . lacosamide  200 mg Oral BID  . lamoTRIgine  100 mg Oral Daily  . lamoTRIgine  50 mg Oral QHS  . pantoprazole  40 mg Oral Daily   Continuous Infusions:  PRN Meds:.clonazePAM, ibuprofen, LORazepam    LOS: 1 day   Hilding Quintanar,CHRISTOPHER  [facility] Pager [phone]. If 8PM-8AM, please contact night-coverage at [email or web address], password [facility] 04/22/2012, 8:09 AM  LOS: 1 day

## 2012-04-23 DIAGNOSIS — D72829 Elevated white blood cell count, unspecified: Secondary | ICD-10-CM

## 2012-04-23 DIAGNOSIS — R51 Headache: Secondary | ICD-10-CM

## 2012-04-23 LAB — BASIC METABOLIC PANEL
BUN: 11 mg/dL (ref 6–23)
CO2: 23 mEq/L (ref 19–32)
Calcium: 9 mg/dL (ref 8.4–10.5)
Chloride: 108 mEq/L (ref 96–112)
Creatinine, Ser: 0.62 mg/dL (ref 0.50–1.10)
GFR calc Af Amer: >90 mL/min (ref >90)
GFR calc non Af Amer: >90 mL/min (ref >90)
Glucose, Bld: 102 mg/dL — ABNORMAL HIGH (ref 70–99)
Potassium: 3.4 mEq/L — ABNORMAL LOW (ref 3.5–5.1)
Sodium: 141 mEq/L (ref 135–145)

## 2012-04-23 LAB — CBC
HCT: 37.3 % (ref 36.0–46.0)
Hemoglobin: 13.2 g/dL (ref 12.0–15.0)
MCH: 31 pg (ref 26.0–34.0)
MCHC: 35.4 g/dL (ref 30.0–36.0)
MCV: 87.6 fL (ref 78.0–100.0)
Platelets: 308 10*3/uL (ref 150–400)
RBC: 4.26 MIL/uL (ref 3.87–5.11)
RDW: 14.1 % (ref 11.5–15.5)
WBC: 8.5 10*3/uL (ref 4.0–10.5)

## 2012-04-23 MED ORDER — MECLIZINE HCL 12.5 MG PO TABS
12.5000 mg | ORAL_TABLET | Freq: Once | ORAL | Status: AC
  Administered 2012-04-23: 12.5 mg via ORAL
  Filled 2012-04-23: qty 1

## 2012-04-23 MED ORDER — LAMOTRIGINE 100 MG PO TABS: 100.0000 mg | ORAL_TABLET | Freq: Two times a day (BID) | ORAL | Status: DC

## 2012-04-23 MED ORDER — LAMOTRIGINE 100 MG PO TABS
100.0000 mg | ORAL_TABLET | Freq: Two times a day (BID) | ORAL | Status: DC
  Filled 2012-04-23: qty 1

## 2012-04-23 MED ORDER — POTASSIUM CHLORIDE CRYS ER 20 MEQ PO TBCR
40.0000 meq | EXTENDED_RELEASE_TABLET | Freq: Once | ORAL | Status: AC
  Administered 2012-04-23: 40 meq via ORAL
  Filled 2012-04-23: qty 2

## 2012-04-23 MED ORDER — LACOSAMIDE 200 MG PO TABS: 200.0000 mg | ORAL_TABLET | Freq: Two times a day (BID) | ORAL | Status: DC

## 2012-04-23 NOTE — Discharge Summary (Addendum)
Physician Discharge Summary  [name] CZY:[id number] DOB: 12-27-1965 DOA: 04/21/2012  PCP: Rudi Heap, MD  Admit date: 04/21/2012 Discharge date: 04/23/2012  Time spent: 40 minutes  Recommendations for Outpatient Follow-up:  1. Follow up with PMD. 2. Follow up with primary neurologist, at [facility]. Expedited appointment has been arranged.   Discharge Diagnoses:  Active Problems:   Hypokalemia   Seizure disorder   Epilepsy, grand mal   Discharge Condition: Satisfactory.   Diet recommendation: Regular.   Filed Weights   04/21/12 2244 04/22/12 0040 04/23/12 0520  Weight: 74.844 kg (165 lb) 73.619 kg (162 lb 4.8 oz) 73.392 kg (161 lb 12.8 oz)    History of present illness:  47 yo female, with history of seizures disorder, osteoarthritis, s/p left TKA, depression, constipation, vertigo, and headaches, RLS. She has had seizures since 2002 verified by video EEG at [facility]. She presented to an outside ED yesterday after having a witnessed tonic-clonic seizure lasting 30 seconds at another facility. Unfortunately, Keppra was not available there, so pt was sent to [facility] for further evaluation and treatment. She normally takes Vimpat 200mg  po bid and Lamictal 100mg  am and 50mg  pm. She was on Dilantin in the past, but had liver issues with that so it was discontinued. Patient reports 3 seizures since 4//8/14. She admits that she sometimes forgets to take her meds and may have missed some doses in past few days. Admitted for further management.   Hospital Course:  1. Seizure disorder: Patient has known history of epilepsy and non-epilepsy seizures, first diagnosed at [facility] in 2002. She appears to be non- compliant with medication, which has resulted in recurrent seizures. Dr Wyatt Portela provided neurology consultation, patient received a loading dose of Keppra, then reinstated on pre-admission anticonvulsants and patient has remained seizure-free. Patient appears intolerant of Vimpat, which was  therefore, discontinued in the past. Dr Thad Ranger has increased Lamictal to 100mg  BID in lieu. [facility] contacted and patient will be called concerning moving up appointment 2. Depression: Clinically stable at this time.  3. Headaches: Not problematic on Ibuprofen and prn Tylenol.  4. Hypokalemia: Mild. Repleted as indicated.  5. Leukocytosis; Patient had wcc of 16.6 at presentation, likely due to seizure activity, as there appears to be no focus of infection, and she is apyrexia. As of 04/22/12, wcc had normalized at 9.9.   Procedures:  See Below.   Consultations:  Dr Wyatt Portela, neurologist.   Discharge Exam: Filed Vitals:   04/23/12 1044 04/23/12 1046 04/23/12 1047 04/23/12 1422  BP: 135/91 135/96 148/108 126/80  Pulse: 74 76 103 65  Temp: 97.9 F (36.6 C)   98.1 F (36.7 C)  TempSrc: Oral   Oral  Resp: 18 18 20 18   Height:      Weight:      SpO2: 99% 99% 99% 99%    General: Comfortable, alert, communicative, fully oriented, not short of breath at rest.  HEENT: No clinical pallor, no jaundice, no conjunctival injection or discharge. Hydration is fair.  NECK: Supple, JVP not seen, no carotid bruits, no palpable lymphadenopathy, no palpable goiter.  CHEST: Clinically clear to auscultation, no wheezes, no crackles.  HEART: Sounds 1 and 2 heard, normal, regular, no murmurs.  ABDOMEN: Full, soft, non-tender, no palpable organomegaly, no palpable masses, normal bowel sounds.  GENITALIA: Not examined.  LOWER EXTREMITIES: No pitting edema, palpable peripheral pulses.  MUSCULOSKELETAL SYSTEM: Unremarkable.  CENTRAL NERVOUS SYSTEM: No focal neurologic deficit on gross examination.  Discharge Instructions  Discharge Orders   Future Appointments Provider Department Dept Phone   05/21/2012 3:00 PM Melvyn Novas, MD [facility] [phone]   Future Orders Complete By Expires     Diet general  As directed     Increase activity slowly  As directed          Medication List    TAKE these medications       clonazePAM 0.5 MG tablet  Commonly known as:  KLONOPIN  Take 0.5-1 mg by mouth 2 (two) times daily. Takes 0.5mg  in the morning & 1mg  at night     FLUoxetine 20 MG capsule  Commonly known as:  PROZAC  Take 60 mg by mouth every morning.     gabapentin 600 MG tablet  Commonly known as:  NEURONTIN  Take 600-1,200 mg by mouth 4 (four) times daily. Takes 600mg  3x daily & 1200mg  at night     lamoTRIgine 100 MG tablet  Commonly known as:  LAMICTAL  Take 1 tablet (100 mg total) by mouth 2 (two) times daily.       Follow-up Information   Follow up with Rudi Heap, MD.   Contact information:   [address] [place] [address] [phone]       Please follow up. (Follow up with primary neurologist. )        The results of significant diagnostics from this hospitalization (including imaging, microbiology, ancillary and laboratory) are listed below for reference.    Significant Diagnostic Studies: Ct Head Wo Contrast  04/21/2012  *RADIOLOGY REPORT*  Clinical Data: Head injury and seizure.  CT HEAD WITHOUT CONTRAST  Technique:  Contiguous axial images were obtained from the base of the skull through the vertex without contrast.  Comparison: CT head without contrast 10/11/2011.  Findings: No acute cortical infarct, hemorrhage, or mass lesion is present.  No significant extra-axial fluid collection is present. The osseous skull is intact.  The paranasal sinuses and mastoid air cells are clear.  IMPRESSION: Negative CT head.   Original Report Authenticated By: Marin Roberts, M.D.     Microbiology: No results found for this or any previous visit (from the past 240 hour(s)).   Labs: Basic Metabolic Panel:  Recent Labs Lab 04/21/12 2118 04/22/12 1020 04/22/12 1446 04/23/12 0610  NA 146*  --  140 141  K 3.2*  --  3.6 3.4*  CL 107  --  109 108  CO2 14*  --  21 23  GLUCOSE 116*  --  100* 102*  BUN 10  --  11 11   CREATININE 0.60  --  0.62 0.62  CALCIUM 9.9  --  8.9 9.0  MG  --  2.4  --   --    Liver Function Tests: No results found for this basename: AST, ALT, ALKPHOS, BILITOT, PROT, ALBUMIN,  in the last 168 hours No results found for this basename: LIPASE, AMYLASE,  in the last 168 hours No results found for this basename: AMMONIA,  in the last 168 hours CBC:  Recent Labs Lab 04/21/12 2118 04/22/12 0554 04/23/12 0610  WBC 16.6* 9.9 8.5  NEUTROABS 9.9*  --   --   HGB 14.0 12.5 13.2  HCT 41.0 35.9* 37.3  MCV 90.7 87.1 87.6  PLT 332 294 308   Cardiac Enzymes: No results found for this basename: CKTOTAL, CKMB, CKMBINDEX, TROPONINI,  in the last 168 hours BNP: BNP (last 3 results) No results found for this basename: PROBNP,  in the last 8760  hours CBG:  Recent Labs Lab 04/21/12 2201  GLUCAP 95       Signed:  Ashar Lewinski,CHRISTOPHER  [facility] 04/23/2012, 2:40 PM

## 2012-04-23 NOTE — Telephone Encounter (Signed)
Per Dr. Vickey Huger, patient is to sched appt. With Pcp, informed patient, patient understood.

## 2012-04-23 NOTE — Progress Notes (Signed)
Received referral from inpatient RNCM for [facility] Care Management services. Patient is eligible for services. Noted patient to be discharged today. Attempted to call patient in room prior to discharge to explain [facility] Care Management services. However, phone rings fast busy. Call made to nursing station to request that patient be told this writer is trying to call in room.  Arvella Merles, RN,BSN- [facility] [phone]

## 2012-04-23 NOTE — Progress Notes (Signed)
Subjective: Patient has had no further seizures since admission.  Has been placed on Vimpat but has today started to experience dizziness.  Reports that she had dizziness in the past when on Vimpat and this had to be discontinued.  Hs never been on a higher dose of Lamictal and has seemed to tolerate it well.  Objective: Current vital signs: BP 148/108  Pulse 103  Temp(Src) 97.9 F (36.6 C) (Oral)  Resp 20  Ht 5' (1.524 m)  Wt 73.392 kg (161 lb 12.8 oz)  BMI 31.6 kg/m2  SpO2 99% Vital signs in last 24 hours: Temp:  [97.9 F (36.6 C)-98.2 F (36.8 C)] 97.9 F (36.6 C) (04/11 1044) Pulse Rate:  [59-103] 103 (04/11 1047) Resp:  [18-20] 20 (04/11 1047) BP: (113-148)/(76-108) 148/108 mmHg (04/11 1047) SpO2:  [99 %] 99 % (04/11 1047) Weight:  [73.392 kg (161 lb 12.8 oz)] 73.392 kg (161 lb 12.8 oz) (04/11 0520)  Intake/Output from previous day:   Intake/Output this shift:   Nutritional status: General  Neurologic Exam: Mental Status:  Alert, awake, oriented to place, year, person, and situation, thought content appropriate. Speech fluent without evidence of aphasia. Able to follow 3 step commands without difficulty.  Cranial Nerves:  II: Discs flat bilaterally; Visual fields grossly normal, pupils equal, round, reactive to light and accommodation  III,IV, VI: ptosis not present, extra-ocular motions intact bilaterally  V,VII: smile symmetric, facial light touch sensation normal bilaterally  VIII: hearing normal bilaterally  IX,X: gag reflex present  XI: bilateral shoulder shrug  XII: midline tongue extension  Motor:  Right : Upper extremity 5/5         Left: Upper extremity 5/5   Lower extremity 5/5      Lower extremity 5/5  Tone and bulk:normal tone throughout; no atrophy noted  Sensory: Pinprick and light touch intact throughout, bilaterally  Deep Tendon Reflexes: 2+ and symmetric throughout  Plantars:  Right: downgoing   Left: downgoing  Cerebellar:  normal  finger-to-nose, normal heel-to-shin test   Lab Results: Basic Metabolic Panel:  Recent Labs Lab 04/21/12 2118 04/22/12 1020 04/22/12 1446 04/23/12 0610  NA 146*  --  140 141  K 3.2*  --  3.6 3.4*  CL 107  --  109 108  CO2 14*  --  21 23  GLUCOSE 116*  --  100* 102*  BUN 10  --  11 11  CREATININE 0.60  --  0.62 0.62  CALCIUM 9.9  --  8.9 9.0  MG  --  2.4  --   --     Liver Function Tests: No results found for this basename: AST, ALT, ALKPHOS, BILITOT, PROT, ALBUMIN,  in the last 168 hours No results found for this basename: LIPASE, AMYLASE,  in the last 168 hours No results found for this basename: AMMONIA,  in the last 168 hours  CBC:  Recent Labs Lab 04/21/12 2118 04/22/12 0554 04/23/12 0610  WBC 16.6* 9.9 8.5  NEUTROABS 9.9*  --   --   HGB 14.0 12.5 13.2  HCT 41.0 35.9* 37.3  MCV 90.7 87.1 87.6  PLT 332 294 308    Cardiac Enzymes: No results found for this basename: CKTOTAL, CKMB, CKMBINDEX, TROPONINI,  in the last 168 hours  Lipid Panel: No results found for this basename: CHOL, TRIG, HDL, CHOLHDL, VLDL, LDLCALC,  in the last 168 hours  CBG:  Recent Labs Lab 04/21/12 2201  GLUCAP 95    Microbiology: Results for orders placed during the  hospital encounter of 10/14/11  SURGICAL PCR SCREEN     Status: None   Collection Time    10/14/11 12:56 PM      Result Value Range Status   MRSA, PCR NEGATIVE  NEGATIVE Final   Staphylococcus aureus NEGATIVE  NEGATIVE Final   Comment:            The Xpert SA Assay (FDA     approved for NASAL specimens     in patients over 24 years of age),     is one component of     a comprehensive surveillance     program.  Test performance has     been validated by [employer] for patients greater     than or equal to 59 year old.     It is not intended     to diagnose infection nor to     guide or monitor treatment.    Coagulation Studies: No results found for this basename: LABPROT, INR,  in the last 72  hours  Imaging: Ct Head Wo Contrast  04/21/2012  *RADIOLOGY REPORT*  Clinical Data: Head injury and seizure.  CT HEAD WITHOUT CONTRAST  Technique:  Contiguous axial images were obtained from the base of the skull through the vertex without contrast.  Comparison: CT head without contrast 10/11/2011.  Findings: No acute cortical infarct, hemorrhage, or mass lesion is present.  No significant extra-axial fluid collection is present. The osseous skull is intact.  The paranasal sinuses and mastoid air cells are clear.  IMPRESSION: Negative CT head.   Original Report Authenticated By: Marin Roberts, M.D.     Medications:  I have reviewed the patient's current medications. Scheduled: . enoxaparin (LOVENOX) injection  40 mg Subcutaneous Daily  . FLUoxetine  20 mg Oral Daily  . gabapentin  600 mg Oral TID AC & HS  . lamoTRIgine  100 mg Oral BID  . meclizine  12.5 mg Oral Once  . pantoprazole  40 mg Oral Daily    Assessment/Plan: Patient with no further seizures since admission.  Has poor tolerance to Vimpat and Keppra.  Seems to tolerate Lamictal well.  Recommendations: 1.  Increase Lamictal to 100mg  BID 2.  Meclizine 12.5 mg now.  Patient reports that this worked for her dizziness related to Vimpat in the past.  Symptoms should be self-limited since Vimpat to be discontinued. 3.  D/C Vimpat 4.  [facility] contacted and patient will be called concerning moving up appointment  Case discussed with Dr. Brien Few    LOS: 2 days   Thana Farr, MD [facility] [phone] 04/23/2012  2:05 PM

## 2012-04-23 NOTE — Progress Notes (Signed)
Spoke with Mrs Larner via phone who states she is agreeable to [facility] Care Management following her telephonically. Verbal consent obtained via phone. Confirmed phone number. Explained to Mrs Sulton she will receive post transition of care call along with evaluation for monthly home visits if needed. [facility] Care Management does not replace home health services however.  Raiford Noble, MSN- Julaine Fusi, [facility] [phone]

## 2012-04-23 NOTE — Care Management Note (Signed)
    Page 1 of 1   04/23/2012     11:16:57 AM   CARE MANAGEMENT NOTE 04/23/2012  Patient:  [name], [name]   Account Number:  [id number]  Date Initiated:  04/23/2012  Documentation initiated by:  GRAVES-BIGELOW,Silena Wyss  Subjective/Objective Assessment:   Pt admitted with seizure activity. Pt states she is from home with family.     Action/Plan:   CM did speak to pt in reference to medications and pt states she has a pill box at home and her family is available for assistance.   Anticipated DC Date:  04/23/2012   Anticipated DC Plan:  HOME/SELF CARE      DC Planning Services  CM consult  Patient refused services      Choice offered to / List presented to:             Status of service:  Completed, signed off Medicare Important Message given?   (If response is "NO", the following Medicare IM given date fields will be blank) Date Medicare IM given:   Date Additional Medicare IM given:    Discharge Disposition:  HOME/SELF CARE  Per UR Regulation:  Reviewed for med. necessity/level of care/duration of stay  If discussed at Long Length of Stay Meetings, dates discussed:    Comments:  04-23-12 [address] [place] [phone] CM did call [facility] liaison Raiford Noble in reference to insurance and blue mediare making a f/u calls to make sure pt taking medicine appropriately. Pt refusing HH services at this time. No further needs from CM at this time.

## 2012-05-21 ENCOUNTER — Encounter: Payer: Self-pay | Admitting: Neurology

## 2012-05-21 ENCOUNTER — Ambulatory Visit (INDEPENDENT_AMBULATORY_CARE_PROVIDER_SITE_OTHER): Payer: Medicare Other | Admitting: Neurology

## 2012-05-21 VITALS — BP 105/69 | HR 71 | Ht 62.0 in | Wt 163.0 lb

## 2012-05-21 DIAGNOSIS — G40802 Other epilepsy, not intractable, without status epilepticus: Secondary | ICD-10-CM

## 2012-05-21 DIAGNOSIS — R569 Unspecified convulsions: Secondary | ICD-10-CM

## 2012-05-21 HISTORY — DX: Unspecified convulsions: R56.9

## 2012-05-21 NOTE — Progress Notes (Signed)
[facility]  Provider:  Dr Breauna Mazzeo Referring Provider: Ernestina Penna, MD Primary Care Physician:  Rudi Heap, MD  Chief Complaint  Patient presents with  . Follow-up    seizure,rm 11    HPI:  [name] is a 47 y.o. female here as a referral from Dr. Christell Constant for spells. She was last seen by Dr. love on 01/12/2012. The patient's chief complaint at the time was dizziness she has a history of new onset generalized tonic-clonic seizures beginning 10-12-2000 accompanied by an abnormal EEG showing generalized sharp wave discharges at 3 Hz. This is consistent with a primary generalized seizure disorder. She was initially treated with Dilantin and then changed to Depakote ER. 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 Because of and streaky change topiramate tolerated. She was referred to Dr. Kendell Bane at [facility] documented nonepileptic seizures. A 24 hour prolonged EEG monitoring and 2004 showed that the patient had both, pseudoseizures and seizures with generalized epilepsy. In August 2012 she had a repeated seizure activity during her dentist office visit. Blood studies, MRI and CT were normal EEG shorted and spike and wave complexes she was discharged on November Dr. love has recorded that she has to spells per month on average she is [facility]. And takes Prozac. Her liver function tests were often elevated 11/22/2011 her AST was 65 and ALT 85 her last ambulatory EEG September 2013 showed abundant generalized 3-5 Hz spikes and polyspike waves.  Current medications include: 100 mg of Lamictal twice a day, Prozac 60 mg a day , Gabapentin 800 mg, 5 tablets per day Klonipin  0.5 3  times a day - she is no longer using VIMPAT which [name] discontinued.    Review of  Systems: Out of a complete 14 system review, the patient complains of only the following symptoms, and all other reviewed systems are negative. Non epileptic and epileptic seizures.   History   Social History  . Marital Status: Married    Spouse Name: Harrold Donath    Number of Children: 2  . Years of Education: N/A   Occupational History  .  Other   Social History Main Topics  . Smoking status: Former Games developer  . Smokeless tobacco: Never Used  . Alcohol Use: No  . Drug Use: No  . Sexually Active: Not Currently    Birth Control/ Protection: Post-menopausal   Other Topics Concern  . Not on file   Social History Narrative  . No narrative on file    Family History  Problem Relation Age of Onset  . Cancer Mother   . Cancer Brother   . Diabetes Brother   . Cancer Maternal Aunt   . Diabetes Maternal Aunt     Past Medical History  Diagnosis Date  . Vertigo   . Constipation   . Restless leg   . High cholesterol   . Chronic bronchitis     "yearly; when the weather changes" (04/22/2012)  . Headache     "1/wk" (04/22/2012)  . Depression   . Epilepsy     "been having them right often here lately" (04/22/2012)  . Seizures   . Arthritis     "knees" (04/22/2012)  . Osteoarthritis     /notes 04/22/2012    Past Surgical History  Procedure  Laterality Date  . Total hip arthroplasty Left 1993; 1995; 2000  . Abdominal hysterectomy  2001  . Colonoscopy    . Joint replacement    . Mass excision  10/22/2011    Procedure: EXCISION MASS;  Surgeon: Shelly Rubenstein, MD;  Location: [facility] OR;  Service: General;  Laterality: Right;  excision right buttock mass  . Bladder suspension    . Cesarean section  1987; 1988  . Bunionectomy Left 2000    Current Outpatient Prescriptions  Medication Sig Dispense Refill  . clonazePAM (KLONOPIN) 0.5 MG tablet Take 0.5-1 mg by mouth 2 (two) times daily. Takes 0.5mg  in the morning & 1mg  at night      . FLUoxetine (PROZAC) 20 MG capsule Take 60 mg by  mouth every morning.      . gabapentin (NEURONTIN) 600 MG tablet Take 600-1,200 mg by mouth 4 (four) times daily. Takes 600mg  3x daily & 1200mg  at night      . lamoTRIgine (LAMICTAL) 100 MG tablet Take 1 tablet (100 mg total) by mouth 2 (two) times daily.  60 tablet  0  . meloxicam (MOBIC) 15 MG tablet        No current facility-administered medications for this visit.    Allergies as of 05/21/2012 - Review Complete 05/21/2012  Allergen Reaction Noted  . Vimpat (lacosamide) Other (See Comments) 04/23/2012  . Benadryl (diphenhydramine hcl) Other (See Comments) 04/23/2011  . Betadine (povidone iodine)  11/05/2011  . Codeine    . Dilantin (phenytoin sodium extended)  06/22/2011  . Melatonin Other (See Comments) 04/23/2011  . Ultram (tramadol hcl) Other (See Comments) 07/15/2010  . Vicodin (hydrocodone-acetaminophen) Itching 12/26/2010  . Latex Rash 07/15/2010  . Penicillins Itching and Rash     Vitals: BP 105/69  Pulse 71  Ht 5\' 2"  (1.575 m)  Wt 163 lb (73.936 kg)  BMI 29.81 kg/m2 Last Weight:  Wt Readings from Last 1 Encounters:  05/21/12 163 lb (73.936 kg)   Last Height:   Ht Readings from Last 1 Encounters:  05/21/12 5\' 2"  (1.575 m)   Vision Screening:  Vitals.  Physical exam:  General: The patient is awake, alert and appears not in acute distress. The patient is well groomed. Head: Normocephalic, atraumatic. Neck is supple. Cardiovascular:  Regular rate and rhythm , without  murmurs or carotid bruit, and without distended neck veins. Respiratory: Lungs are clear to auscultation. Skin:  Without evidence of edema, or rash Trunk: BMI is elevated and patient  has normal posture.  Neurologic exam : The patient is awake and alert, oriented to place and time.  Memory subjective   described as intact. There is a normal attention span & concentration ability. Speech is fluent with a lisp-no  dysarthria, dysphonia or aphasia. Mood and affect are appropriate.  Cranial  nerves: Pupils are equal and briskly reactive to light. Funduscopic exam without evidence of pallor or edema. Extraocular movements  in vertical and horizontal planes intact and without nystagmus. Visual fields by finger perimetry are intact. Hearing to finger rub intact.  Facial sensation intact to fine touch. Facial motor strength is symmetric and tongue and uvula move midline.  Motor exam:   Hip ROM restricted since birth -  increased  Tone in the left leg, and normal muscle bulk and symmetric normal strength in upper  extremities.  Sensory:  Fine touch, pinprick and vibration were tested in all extremities. Proprioception is tested in the upper extremities only. This was  normal.  Coordination: Rapid alternating  movements in the fingers/hands is tested and normal. Finger-to-nose maneuver tested and normal without evidence of ataxia, dysmetria or tremor.  Gait and station: Patient walks without assistive device and is able and assisted stool climb up to the exam table. Strength within normal limits. Stance is stable and normal. Tandem gait is impaired - due to hip dislocation.  Steps are unfragmented. High fall rsik,  Romberg testing is  normal.  Deep tendon reflexes: in the  upper and lower extremities are symmetric and intact.  Assessment:  After physical and neurologic examination, review of laboratory studies, imaging, neurophysiology testing and pre-existing records, assessment will be reviewed on the problem list.  Plan:  Treatment plan and additional workup will be reviewed under Problem List.   The patient has developed intolerance to different antiepileptic medications over the years. Impact had to be replaced because of dizziness and diplopia as well as excessive sleepiness. She has both epileptic seizures and nonepileptic seizures. She had a hospital visit on 04/21/2012 to ER at [facility] and reports another spell on 05/09/2012.  The patient reports that she did best on Keppra  ,  but was unable to afford the medication.  She also reports having had multiple myoclonic jerks which are husband has observed are announcing  further spells to come. The patient is aware of that in times of higher stress levels nonepileptic seizures as well as her epilepsy may exacerbate.   Patient will be asked today to carry her Klonopin with her to prevent a single seizure from forming a cluster. The patient continue her depression therapy with her psychiatrist. The patient is not allowed to drive.

## 2012-05-21 NOTE — Patient Instructions (Addendum)
Seizures You had a seizure. About 2% of the population will have a seizure problem during their lifetime. Sometimes the cause for the seizure is not known. Seizures are usually associated with one of these problems:  Epilepsy.   Not taking your seizure medicine.   Alcohol and drug abuse.   Head injury, strokes, tumors, and brain surgery.   High fever and infections.   Low blood sugar.  Evaluating a new seizure disorder may require having a brain scan or a brain wave test called an EEG. If you have been given a seizure medicine, it is very important that you take it as prescribed. Not taking these medicines as directed is the most common cause of seizures. Blood tests are often used to be sure you are taking the proper dose.  Seizures cause many different symptoms, from convulsions to brief blackouts. Do not ride a bike, drive a car, go swimming, climb in high or dangerous places such as ladders or roofs, or operate any dangerous equipment until you have your doctor's permission. If you hold a driver's license, state law may require that a report be made to the motor vehicles department. You should wear an emergency medical identification bracelet with information about your seizures. If you have any warning that a seizure may occur, lie down in a safe place to protect yourself. Teach your family and friends what to do if you have any further seizures. They should stay calm and try to keep you from falling on hard or sharp objects. It is best not to try to restrain a seizing person or to force anything into his or her mouth. Do not try to open clenched jaws. When the seizure is over, the person should be rolled on their side to help drain any vomit or secretions from the mouth. After a seizure, a person may be confused or drowsy for several minutes. An ambulance should be called if the seizure lasted more than 5 minutes or if confusion remains for more than 30 minutes. Call your caregiver or the  emergency department for further instructions. Do not drive until cleared by your caregiver or neurologist! Document Released: 02/07/2004 Document Revised: 09/11/2010 Document Reviewed: 12/30/2004 [facility] Patient Information 2012 [place], [place].Suicidal Feelings, How to Help Yourself Everyone feels sad or unhappy at times, but depressing thoughts and feelings of hopelessness can lead to thoughts of suicide. It can seem as if life is too tough to handle. If you feel as though you have reached the point where suicide is the only answer, it is time to let someone know immediately.  HOW TO COPE AND PREVENT SUICIDE  Let family, friends, teachers, or counselors know. Get help. Try not to isolate yourself from those who care about you. Even though you may not feel sociable, talk with someone every day. It is best if it is face-to-face. Remember, they will want to help you.  Eat a regularly spaced and well-balanced diet.  Get plenty of rest.  Avoid alcohol and drugs because they will only make you feel worse and may also lower your inhibitions. Remove them from the home. If you are thinking of taking an overdose of your prescribed medicines, give your medicines to someone who can give them to you one day at a time. If you are on antidepressants, let your caregiver know of your feelings so he or she can provide a safer medicine, if that is a concern.  Remove weapons or poisons from your home.  Try to stick to  routines. Follow a schedule and remind yourself that you have to keep that schedule every day.  Set some realistic goals and achieve them. Make a list and cross things off as you go. Accomplishments give a sense of worth. Wait until you are feeling better before doing things you find difficult or unpleasant to do.  If you are able, try to start exercising. Even half-hour periods of exercise each day will make you feel better. Getting out in the sun or into nature helps you recover from depression  faster. If you have a favorite place to walk, take advantage of that.  Increase safe activities that have always given you pleasure. This may include playing your favorite music, reading a good book, painting a picture, or playing your favorite instrument. Do whatever takes your mind off your depression.  Keep your living space well-lighted. GET HELP Contact a suicide hotline, crisis center, or local suicide prevention center for help right away. Local centers may include a hospital, clinic, community service organization, social service provider, or health department.  Call your local emergency services (911 in the [place]).  Call a suicide hotline:  1-800-273-TALK (563-550-2432) in the [place].  1-800-SUICIDE 308-707-0772) in the [place].  5185874476 in the [place] for Spanish-speaking counselors.  4-132-440-1UUV (984) 278-4651) in the [place] for TTY users.  Visit the following websites for information and help:  National Suicide Prevention Lifeline: www.suicidepreventionlifeline.org  Hopeline: www.hopeline.com  [employer] for Suicide Prevention: https://www.ayers.com/  For lesbian, gay, bisexual, transgender, or questioning youth, contact [employer]:  4-259-5-G-LOVFIE 979-718-4547) in the [place].  www.thetrevorproject.org  In [place], treatment resources are listed in each province with listings available under [employer] for [employer] or similar titles. Another source for Crisis Centres by [place] is located at http://www.suicideprevention.ca/in-crisis-now/find-a-crisis-centre-now/crisis-centres Document Released: 07/06/2002 Document Revised: 03/24/2011 Document Reviewed: 11/24/2006 [facility] Patient Information 2013 [place], [place].

## 2012-05-31 ENCOUNTER — Telehealth: Payer: Self-pay | Admitting: Nurse Practitioner

## 2012-05-31 DIAGNOSIS — K921 Melena: Secondary | ICD-10-CM

## 2012-05-31 NOTE — Telephone Encounter (Signed)
Referral made 

## 2012-05-31 NOTE — Telephone Encounter (Signed)
Pt aware referral made

## 2012-06-17 ENCOUNTER — Encounter: Payer: Self-pay | Admitting: Internal Medicine

## 2012-06-17 ENCOUNTER — Ambulatory Visit (INDEPENDENT_AMBULATORY_CARE_PROVIDER_SITE_OTHER): Payer: Medicare Other | Admitting: Internal Medicine

## 2012-06-17 VITALS — BP 100/70 | HR 76 | Ht 60.0 in | Wt 159.4 lb

## 2012-06-17 DIAGNOSIS — R195 Other fecal abnormalities: Secondary | ICD-10-CM

## 2012-06-17 DIAGNOSIS — Z860100 Personal history of colon polyps, unspecified: Secondary | ICD-10-CM

## 2012-06-17 DIAGNOSIS — K625 Hemorrhage of anus and rectum: Secondary | ICD-10-CM

## 2012-06-17 MED ORDER — MOVIPREP 100 G PO SOLR: 1.0000 | Freq: Once | ORAL | Status: DC

## 2012-06-17 NOTE — Progress Notes (Signed)
HISTORY OF PRESENT ILLNESS:  [name] is a 47 y.o. female sent by Dr. Rudi Heap regarding rectal bleeding and Hemoccult-positive stool. Patient has a history of adenomatous colon polyps, arthritis, and seizure disorder. She was evaluated in July of 2009 for abdominal discomfort and Hemoccult-positive stool. Her abdominal discomfort was felt to be musculoskeletal. Constipation was felt to be functional. Because of Hemoccult-positive stool, she subsequently underwent complete colonoscopy 08/11/2007. She was found to have 3 diminutive colon polyps, 2 of which were adenomatous. Followup in 5 years recommended. Patient reports developing red blood with bowel movement on several occasions several weeks ago. Some associated rectal discomfort. No abdominal pain. Testing her primary physician's office revealed Hemoccult-positive stool. The patient also takes Mobic for arthritis. Review of outside laboratories from April 2014 reveal normal basic metabolic panel as well as normal CBC including hemoglobin of 13.2. Patient denies other NSAIDs. She continues with constipation. Negative GI review of systems otherwise. She is accompanied by her husband  REVIEW OF SYSTEMS:  All non-GI ROS negative except for headaches, sleeping problems,  depression, fatigue, confusion, visual change, arthritis  Past Medical History  Diagnosis Date  . Vertigo   . Constipation   . Restless leg   . High cholesterol   . Chronic bronchitis     "yearly; when the weather changes" (04/22/2012)  . ZOXWRUEA(540.9)     "1/wk" (04/22/2012)  . Depression   . Epilepsy     "been having them right often here lately" (04/22/2012)  . Seizures   . Arthritis     "knees" (04/22/2012)  . Osteoarthritis     Hattie Perch 04/22/2012  . Colon polyps     adenomatous and hyperplastic-  . Rectal bleed     in toilet- bright red    Past Surgical History  Procedure Laterality Date  . Total hip arthroplasty Left 1993; 1995; 2000  . Abdominal  hysterectomy  2001  . Colonoscopy    . Joint replacement    . Mass excision  10/22/2011    Procedure: EXCISION MASS;  Surgeon: Shelly Rubenstein, MD;  Location: [facility] OR;  Service: General;  Laterality: Right;  excision right buttock mass  . Bladder suspension    . Cesarean section  1987; 1988  . Bunionectomy Left 2000    Social History LORIENE TAUNTON  reports that she has quit smoking. She has never used smokeless tobacco. She reports that she does not drink alcohol or use illicit drugs.  family history includes Cancer in her brother, maternal aunt, and mother and Diabetes in her brother and maternal aunt.  Allergies  Allergen Reactions  . Vimpat (Lacosamide) Other (See Comments)    Severe dizziness  . Benadryl (Diphenhydramine Hcl) Other (See Comments)    seizures  . Betadine (Povidone Iodine)     rash  . Codeine Itching    Rash, and seizures  . Dilantin (Phenytoin Sodium Extended)     Elevated LFT's  . Melatonin Other (See Comments)    Not sure  . Ultram (Tramadol Hcl) Other (See Comments)    Reaction:Seizures  . Vicodin (Hydrocodone-Acetaminophen) Itching  . Latex Rash  . Penicillins Itching and Rash  . Tape Rash    Paper tape please       PHYSICAL EXAMINATION: Vital signs: BP 100/70  Pulse 76  Ht 5' (1.524 m)  Wt 159 lb 6 oz (72.292 kg)  BMI 31.13 kg/m2  Constitutional: generally well-appearing, no acute distress Psychiatric: alert and oriented x3, cooperative Eyes: extraocular movements  intact, anicteric, conjunctiva pink Mouth: oral pharynx moist, no lesions Neck: supple no lymphadenopathy Cardiovascular: heart regular rate and rhythm, no murmur Lungs: clear to auscultation bilaterally Abdomen: soft, nontender, nondistended, no obvious ascites, no peritoneal signs, normal bowel sounds, no organomegaly Rectal: Deferred until colonoscopy Extremities: no lower extremity edema bilaterally Skin: no lesions on visible extremities Neuro: No focal deficits.    ASSESSMENT:  #1. Intermittent rectal bleeding with rectal discomfort as described. Suspect fissure #2. Hemoccult-positive stool. Possibly related to the same. Rule out GI mucosal lesion. On chronic NSAIDs. #3. History of adenomatous colon polyps. Due for surveillance at this time   PLAN:  #1. Surveillance colonoscopy because of a history of adenomatous polyps. Also to evaluate rectal bleeding and Hemoccult-positive stool.The nature of the procedure, as well as the risks, benefits, and alternatives were carefully and thoroughly reviewed with the patient. Ample time for discussion and questions allowed. The patient understood, was satisfied, and agreed to proceed. #2. Movi prep prescribed. The patient instructed on its use #3. Upper endoscopy to evaluate Hemoccult-positive stool (second time) the patient on NSAIDs chronically. Rule out upper GI mucosal lesion.The nature of the procedure, as well as the risks, benefits, and alternatives were carefully and thoroughly reviewed with the patient. Ample time for discussion and questions allowed. The patient understood, was satisfied, and agreed to proceed. If significant pathology found, she may benefit from a selective NSAID or concurrent PPI administration. I discussed this with the patient and her husband

## 2012-06-17 NOTE — Patient Instructions (Signed)

## 2012-06-23 ENCOUNTER — Encounter: Payer: Self-pay | Admitting: Internal Medicine

## 2012-06-23 ENCOUNTER — Ambulatory Visit (AMBULATORY_SURGERY_CENTER): Payer: Medicare Other | Admitting: Internal Medicine

## 2012-06-23 VITALS — BP 118/87 | HR 51 | Temp 98.2°F | Resp 15 | Ht 60.0 in | Wt 159.0 lb

## 2012-06-23 DIAGNOSIS — K2970 Gastritis, unspecified, without bleeding: Secondary | ICD-10-CM

## 2012-06-23 DIAGNOSIS — D126 Benign neoplasm of colon, unspecified: Secondary | ICD-10-CM

## 2012-06-23 DIAGNOSIS — Z1211 Encounter for screening for malignant neoplasm of colon: Secondary | ICD-10-CM

## 2012-06-23 DIAGNOSIS — R195 Other fecal abnormalities: Secondary | ICD-10-CM

## 2012-06-23 DIAGNOSIS — Z86010 Personal history of colonic polyps: Secondary | ICD-10-CM

## 2012-06-23 MED ORDER — SODIUM CHLORIDE 0.9 % IV SOLN: 500.0000 mL | INTRAVENOUS | Status: DC

## 2012-06-23 NOTE — Progress Notes (Signed)
Patient did not have preoperative order for IV antibiotic SSI prophylaxis. (G8918)  Patient did not experience any of the following events: a burn prior to discharge; a fall within the facility; wrong site/side/patient/procedure/implant event; or a hospital transfer or hospital admission upon discharge from the facility. (G8907)  

## 2012-06-23 NOTE — Patient Instructions (Signed)

## 2012-06-23 NOTE — Progress Notes (Signed)
Awake, to recovery. Report given to [facility], Charity fundraiser. VSS

## 2012-06-23 NOTE — Op Note (Signed)
[facility] [address] [place], [address]   ENDOSCOPY PROCEDURE REPORT  PATIENT: Monica, [name]  MR#: [id number] BIRTHDATE: 1965-06-18 , 46  yrs. old GENDER: Female ENDOSCOPIST: Roxy Cedar, MD REFERRED BY:  Rudi Heap, M.D. PROCEDURE DATE:  06/23/2012 PROCEDURE:  EGD w/ biopsy ASA CLASS:     Class II INDICATIONS:  Heme positive stool. MEDICATIONS: MAC sedation, administered by CRNA and propofol (Diprivan) 170mg  IV TOPICAL ANESTHETIC: none  DESCRIPTION OF PROCEDURE: After the risks benefits and alternatives of the procedure were thoroughly explained, informed consent was obtained.  The LB [id number] endoscope was introduced through the mouth and advanced to the second portion of the duodenum. Without limitations.  The instrument was slowly withdrawn as the mucosa was fully examined.      EXAM: The upper, middle and distal third of the esophagus were carefully inspected and no abnormalities were noted.  The z-line was well seen at the GEJ.  The endoscope was pushed into the fundus which was normal including a retroflexed view.  The antrum revealed erythematous change c/w mild gastritis. The gastric body, first and second part of the duodenum were unremarkable.  Retroflexed views revealed no abnormalities. CLO bx taken.     The scope was then withdrawn from the patient and the procedure completed.  COMPLICATIONS: There were no complications. ENDOSCOPIC IMPRESSION: 1. mild gastritis 2. Otherwise Normal EGD  RECOMMENDATIONS: 1.Rx CLO if positive 2. Follow up prn  REPEAT EXAM:  eSigned:  Roxy Cedar, MD 06/23/2012 3:17 PM   FA:OZHYQM Christell Constant, MD and The Patient

## 2012-06-23 NOTE — Progress Notes (Signed)
Called to room to assist during endoscopic procedure.  Patient ID and intended procedure confirmed with present staff. Received instructions for my participation in the procedure from the performing physician.  

## 2012-06-23 NOTE — Op Note (Signed)
[facility] [address] [place], [address]   COLONOSCOPY PROCEDURE REPORT  PATIENT: [name], [name]  MR#: [id number] BIRTHDATE: 10/24/1965 , 46  yrs. old GENDER: Female ENDOSCOPIST: Roxy Cedar, MD REFERRED [id number] Program Recall PROCEDURE DATE:  06/23/2012 PROCEDURE:   Colonoscopy with snare polypectomy x 2 ASA CLASS:   Class II INDICATIONS:Patient's personal history of adenomatous colon polyps. 07-2007 w/ TAs MEDICATIONS: MAC sedation, administered by CRNA and propofol (Diprivan) 200mg  IV  DESCRIPTION OF PROCEDURE:   After the risks benefits and alternatives of the procedure were thoroughly explained, informed consent was obtained.  A digital rectal exam revealed no abnormalities of the rectum.   The LB [id number]  endoscope was introduced through the anus and advanced to the cecum, which was identified by both the appendix and ileocecal valve. No adverse events experienced.   The quality of the prep was good, using MoviPrep  The instrument was then slowly withdrawn as the colon was fully examined.      COLON FINDINGS: Two diminutive polyps were found in the ascending colon.  A polypectomy was performed with a cold snare.  The resection was complete and the polyp tissue was completely retrieved.   The colon mucosa was otherwise normal.  Retroflexed views revealed no abnormalities. The time to cecum=3 minutes 31 seconds.  Withdrawal time=14 minutes 07 seconds.  The scope was withdrawn and the procedure completed. COMPLICATIONS: There were no complications.  ENDOSCOPIC IMPRESSION: 1.   Two diminutive polyps were found in the ascending colon; polypectomy was performed with a cold snare 2.   The colon mucosa was otherwise normal  RECOMMENDATIONS: 1.  Follow up colonoscopy in 5 years 2.  Upper endoscopy today (SEE REPORT)   eSigned:  Roxy Cedar, MD 06/23/2012 3:11 PM   cc: Rudi Heap, MD and The Patient   PATIENT  NAME:  [name], [name] MR#: [id number]

## 2012-06-24 ENCOUNTER — Telehealth: Payer: Self-pay | Admitting: *Deleted

## 2012-06-24 LAB — HELICOBACTER PYLORI SCREEN-BIOPSY: UREASE: NEGATIVE

## 2012-06-24 NOTE — Telephone Encounter (Signed)
  Follow up Call-  Call back number 06/23/2012  Post procedure Call Back phone  # [phone]  Permission to leave phone message Yes     Patient questions:  Do you have a fever, pain , or abdominal swelling? no Pain Score  0 *  Have you tolerated food without any problems? yes  Have you been able to return to your normal activities? yes  Do you have any questions about your discharge instructions: Diet   no Medications  no Follow up visit  no  Do you have questions or concerns about your Care? no  Actions: * If pain score is 4 or above: No action needed, pain <4.

## 2012-06-28 ENCOUNTER — Encounter: Payer: Self-pay | Admitting: Internal Medicine

## 2012-07-12 DIAGNOSIS — F445 Conversion disorder with seizures or convulsions: Secondary | ICD-10-CM | POA: Insufficient documentation

## 2012-07-12 DIAGNOSIS — Z8669 Personal history of other diseases of the nervous system and sense organs: Secondary | ICD-10-CM | POA: Insufficient documentation

## 2012-07-21 ENCOUNTER — Telehealth: Payer: Self-pay | Admitting: Nurse Practitioner

## 2012-07-21 NOTE — Telephone Encounter (Signed)
Patient wants to wait and see how she dies today and then will call us back

## 2012-08-05 ENCOUNTER — Other Ambulatory Visit: Payer: Self-pay | Admitting: General Practice

## 2012-08-24 ENCOUNTER — Telehealth: Payer: Self-pay | Admitting: Neurology

## 2012-08-25 MED ORDER — GABAPENTIN 600 MG PO TABS: 600.0000 mg | ORAL_TABLET | Freq: Every day | ORAL | Status: DC

## 2012-08-25 NOTE — Telephone Encounter (Signed)
Rx Sent  

## 2012-09-07 ENCOUNTER — Ambulatory Visit (INDEPENDENT_AMBULATORY_CARE_PROVIDER_SITE_OTHER): Payer: Medicare Other | Admitting: General Practice

## 2012-09-07 ENCOUNTER — Encounter: Payer: Self-pay | Admitting: General Practice

## 2012-09-07 VITALS — BP 105/76 | HR 69 | Temp 97.8°F | Ht 60.0 in | Wt 164.0 lb

## 2012-09-07 DIAGNOSIS — H6690 Otitis media, unspecified, unspecified ear: Secondary | ICD-10-CM

## 2012-09-07 DIAGNOSIS — H6691 Otitis media, unspecified, right ear: Secondary | ICD-10-CM

## 2012-09-07 MED ORDER — AZITHROMYCIN 250 MG PO TABS: ORAL_TABLET | ORAL | Status: DC

## 2012-09-07 NOTE — Progress Notes (Signed)
  Subjective:    Patient ID: [name], female    DOB: [birth year], 47 y.o.   MRN: [id number]  Otalgia  There is pain in the right ear. This is a new problem. The current episode started yesterday. The problem occurs hourly. The problem has been unchanged. There has been no fever. The pain is at a severity of 4/10. Associated symptoms include a sore throat. Pertinent negatives include no coughing, ear discharge or headaches. She has tried nothing for the symptoms.      Review of Systems  Constitutional: Negative for fever and chills.  HENT: Positive for ear pain, congestion and sore throat. Negative for sneezing, postnasal drip, sinus pressure and ear discharge.   Respiratory: Negative for cough, chest tightness and shortness of breath.   Cardiovascular: Negative for chest pain and palpitations.  Neurological: Negative for dizziness, weakness and headaches.       Objective:   Physical Exam  Constitutional: She is oriented to person, place, and time. She appears well-developed and well-nourished.  HENT:  Head: Normocephalic and atraumatic.  Right Ear: Tympanic membrane is erythematous.  Left Ear: Hearing normal.  Mouth/Throat: Posterior oropharyngeal erythema present.  Cardiovascular: Normal rate, regular rhythm and normal heart sounds.   Pulmonary/Chest: Effort normal and breath sounds normal.  Neurological: She is alert and oriented to person, place, and time.  Skin: Skin is warm and dry.  Psychiatric: She has a normal mood and affect.          Assessment & Plan:  1. Otitis media, right - azithromycin (ZITHROMAX) 250 MG tablet; Take as directed  Dispense: 6 tablet; Refill: 0 Increase fluids RTO if symptoms worsen or unresolved Patient verbalized understanding Coralie Keens, FNP-C

## 2012-09-07 NOTE — Patient Instructions (Signed)

## 2012-09-29 ENCOUNTER — Telehealth: Payer: Self-pay | Admitting: Neurology

## 2012-09-29 NOTE — Telephone Encounter (Signed)
We have not received any requests for this patient.  As well, we have not prescribed Klonopin for her in [facility] or [facility].  Perhaps the pharmacy is faxing a different office that may have prescribed this med previously?  She is a former [facility] patient that was seen by Dr Vickey Huger in May.   Dr Dohmeier, would you like to prescribe Klonopin?  Please advise.  Thank you.

## 2012-09-30 ENCOUNTER — Other Ambulatory Visit: Payer: Self-pay | Admitting: Neurology

## 2012-09-30 MED ORDER — CLONAZEPAM 0.5 MG PO TABS: 1.0000 mg | ORAL_TABLET | Freq: Two times a day (BID) | ORAL | Status: DC

## 2012-09-30 NOTE — Telephone Encounter (Signed)
I spoke with the patient.  She said although we have never prescribed this medication, the doctor that was prescribing has moved to another state and her PCP will not write this Rx.  She said she has 3 days of meds left, but cannot be without them and wants to know if Dr Vickey Huger will take over this Rx.  Please advise.  Thank you.

## 2012-09-30 NOTE — Telephone Encounter (Signed)
The provider is mental health at the center she is seen for depression treatment.  This is were she needs to refill - if the provider is not longer there, another provider in the [facility] would need to take it over.  The klonopin can indeed help with seizures , but was prescribed as an antianxiety medication by mental health , it seems.  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 CD

## 2012-10-04 NOTE — Telephone Encounter (Signed)
The pharmacy sent [name] a fax saying Lauris Poag NP has prescribed Klonopin for this patient, and the Rx is ready for pick up at their pharmacy.

## 2012-10-25 ENCOUNTER — Ambulatory Visit (INDEPENDENT_AMBULATORY_CARE_PROVIDER_SITE_OTHER): Payer: Medicare Other | Admitting: General Practice

## 2012-10-25 ENCOUNTER — Encounter (INDEPENDENT_AMBULATORY_CARE_PROVIDER_SITE_OTHER): Payer: Self-pay

## 2012-10-25 ENCOUNTER — Encounter: Payer: Self-pay | Admitting: General Practice

## 2012-10-25 ENCOUNTER — Ambulatory Visit (INDEPENDENT_AMBULATORY_CARE_PROVIDER_SITE_OTHER): Payer: Medicare Other

## 2012-10-25 VITALS — BP 105/77 | HR 81 | Temp 97.3°F | Ht 61.0 in | Wt 167.0 lb

## 2012-10-25 DIAGNOSIS — Z23 Encounter for immunization: Secondary | ICD-10-CM

## 2012-10-25 DIAGNOSIS — IMO0001 Reserved for inherently not codable concepts without codable children: Secondary | ICD-10-CM

## 2012-10-25 DIAGNOSIS — Z124 Encounter for screening for malignant neoplasm of cervix: Secondary | ICD-10-CM

## 2012-10-25 DIAGNOSIS — S79911S Unspecified injury of right hip, sequela: Secondary | ICD-10-CM

## 2012-10-25 DIAGNOSIS — Z0000 Encounter for general adult medical examination without abnormal findings: Secondary | ICD-10-CM

## 2012-10-25 LAB — POCT CBC
Granulocyte percent: 66.9 %G (ref 37–80)
HCT, POC: 42.7 % (ref 37.7–47.9)
Hemoglobin: 13.8 g/dL (ref 12.2–16.2)
Lymph, poc: 2.3 (ref 0.6–3.4)
MCH, POC: 29.7 pg (ref 27–31.2)
MCHC: 32.4 g/dL (ref 31.8–35.4)
MCV: 91.4 fL (ref 80–97)
MPV: 8.5 fL (ref 0–99.8)
POC Granulocyte: 5.4 (ref 2–6.9)
POC LYMPH PERCENT: 29.1 %L (ref 10–50)
Platelet Count, POC: 284 10*3/uL (ref 142–424)
RBC: 4.7 M/uL (ref 4.04–5.48)
RDW, POC: 12.9 %
WBC: 8 10*3/uL (ref 4.6–10.2)

## 2012-10-25 NOTE — Progress Notes (Signed)
  Subjective:    Patient ID: [name], female    DOB: March 11, 1965, 47 y.o.   MRN: [id number]  HPI Patient presents today for annual exam including pap. Patient declines a manual breast exam, reports recently having a mammogram. She reports having a history of seizure, depression, and restless leg syndrome. Reports theses conditions are managed by other providers and medications are also prescribed by them. She reports having a partial hysterectomy in 2009. Reports having a seizure three weeks ago, fall and hurt right hip. She reports still having pain and questions if she has fractured hip.    Review of Systems  Constitutional: Negative for fever and chills.  Respiratory: Negative for chest tightness and shortness of breath.   Cardiovascular: Negative for chest pain and palpitations.  Gastrointestinal: Negative for nausea, vomiting, abdominal pain, diarrhea, constipation and blood in stool.  Genitourinary: Negative for dysuria, hematuria, vaginal bleeding, vaginal discharge, difficulty urinating and vaginal pain.  Musculoskeletal: Negative for back pain.  Neurological: Negative for dizziness, weakness and numbness.  Psychiatric/Behavioral: Negative for suicidal ideas and sleep disturbance.       Objective:   Physical Exam  Constitutional: She is oriented to person, place, and time. She appears well-developed and well-nourished.  HENT:  Head: Normocephalic and atraumatic.  Right Ear: External ear normal.  Left Ear: External ear normal.  Mouth/Throat: Oropharynx is clear and moist.  Eyes: Conjunctivae and EOM are normal. Pupils are equal, round, and reactive to light.  Neck: Normal range of motion. Neck supple. No thyromegaly present.  Cardiovascular: Normal rate, regular rhythm, normal heart sounds and intact distal pulses.   Pulmonary/Chest: Effort normal and breath sounds normal. No respiratory distress. Right breast exhibits no inverted nipple, no mass, no nipple discharge, no skin  change and no tenderness. Left breast exhibits no inverted nipple, no mass, no nipple discharge, no skin change and no tenderness. Breasts are symmetrical.  Abdominal: Soft. Bowel sounds are normal. She exhibits no distension. There is no tenderness.  Genitourinary: Vagina normal and uterus normal. There is no rash, tenderness, lesion or injury on the right labia. There is no rash, tenderness, lesion or injury on the left labia. Right adnexum displays no mass, no tenderness and no fullness. Left adnexum displays no mass, no tenderness and no fullness. No erythema, tenderness or bleeding around the vagina. No foreign body around the vagina. No signs of injury around the vagina. No vaginal discharge found.  Lymphadenopathy:    She has no cervical adenopathy.  Neurological: She is alert and oriented to person, place, and time.  Skin: Skin is warm and dry.  Psychiatric: She has a normal mood and affect.   [facility] reading (PRIMARY) by Coralie Keens, FNP-C, no fracture or dislocation noted.                                        Assessment & Plan:  1. Hip injury, right, sequela  - DG Hip Complete Right; Future  2. Annual physical exam  - Pap IG w/ reflex to HPV when ASC-U - POCT CBC - CMP14+EGFR  3. Need for prophylactic vaccination and inoculation against influenza Continue all current medications Labs pending F/u in 1 year and prn Discussed exercise and diet  Patient verbalized understanding Coralie Keens, FNP-C

## 2012-10-25 NOTE — Patient Instructions (Signed)
Pap Test A Pap test is a procedure done in a clinic office to evaluate cells that are on the surface of the cervix. The cervix is the lower portion of the uterus and upper portion of the vagina. For some women, the cervical region has the potential to form cancer. With consistent evaluations by your caregiver, this type of cancer can be prevented.  If a Pap test is abnormal, it is most often a result of a previous exposure to human papillomavirus (HPV). HPV is a virus that can infect the cells of the cervix and cause dysplasia. Dysplasia is where the cells no longer look normal. If a woman has been diagnosed with high-grade or severe dysplasia, they are at higher risk of developing cervical cancer. People diagnosed with low-grade dysplasia should still be seen by their caregiver because there is a small chance that low-grade dysplasia could develop into cancer.  LET YOUR CAREGIVER KNOW ABOUT:  Recent sexually transmitted infection (STI) you have had.  Any new sex partners you have had.  History of previous abnormal Pap tests results.  History of previous cervical procedures you have had (colposcopy, biopsy, loop electrosurgical excision procedure [LEEP]).  Concerns you have had regarding unusual vaginal discharge.  History of pelvic pain.  Your use of birth control. BEFORE THE PROCEDURE  Ask your caregiver when to schedule your Pap test. It is best not to be on your period if your caregiver uses a wooden spatula to collect cells or applies cells to a glass slide. Newer techniques are not so sensitive to the timing of a menstrual cycle.  Do not douche or have sexual intercourse for 24 hours before the test.   Do not use vaginal creams or tampons for 24 hours before the test.   Empty your bladder just before the test to lessen any discomfort.  PROCEDURE You will lie on an exam table with your feet in stirrups. A warm metal or plastic instrument (speculum) is placed in your vagina. This  instrument allows your caregiver to see the inside of your vagina and look at your cervix. A small, plastic brush or wooden spatula is then used to collect cervical cells. These cells are placed in a lab specimen container. The cells are looked at under a microscope. A specialist will determine if the cells are normal.  AFTER THE PROCEDURE Make sure to get your test results.If your results come back abnormal, you may need further testing.  Document Released: 03/22/2002 Document Revised: 03/24/2011 Document Reviewed: 12/26/2010 [employer] Patient Information 2014 [employer].  

## 2012-10-26 LAB — CMP14+EGFR
ALT: 96 IU/L — ABNORMAL HIGH (ref 0–32)
AST: 80 IU/L — ABNORMAL HIGH (ref 0–40)
Albumin/Globulin Ratio: 2.6 — ABNORMAL HIGH (ref 1.1–2.5)
Albumin: 4.7 g/dL (ref 3.5–5.5)
Alkaline Phosphatase: 70 IU/L (ref 39–117)
BUN/Creatinine Ratio: 18 (ref 9–23)
BUN: 12 mg/dL (ref 6–24)
CO2: 27 mmol/L (ref 18–29)
Calcium: 9.8 mg/dL (ref 8.7–10.2)
Chloride: 97 mmol/L (ref 97–108)
Creatinine, Ser: 0.66 mg/dL (ref 0.57–1.00)
GFR calc Af Amer: 122 mL/min/{1.73_m2} (ref >59)
GFR calc non Af Amer: 106 mL/min/{1.73_m2} (ref >59)
Globulin, Total: 1.8 g/dL (ref 1.5–4.5)
Glucose: 79 mg/dL (ref 65–99)
Potassium: 4.7 mmol/L (ref 3.5–5.2)
Sodium: 142 mmol/L (ref 134–144)
Total Bilirubin: 0.6 mg/dL (ref 0.0–1.2)
Total Protein: 6.5 g/dL (ref 6.0–8.5)

## 2012-10-28 LAB — PAP IG W/ RFLX HPV ASCU: PAP Smear Comment: 0

## 2012-11-02 ENCOUNTER — Telehealth: Payer: Self-pay | Admitting: General Practice

## 2012-11-03 ENCOUNTER — Other Ambulatory Visit: Payer: Self-pay | Admitting: General Practice

## 2012-11-03 DIAGNOSIS — R748 Abnormal levels of other serum enzymes: Secondary | ICD-10-CM

## 2012-11-04 NOTE — Telephone Encounter (Signed)
Patient aware.

## 2012-11-08 ENCOUNTER — Ambulatory Visit (INDEPENDENT_AMBULATORY_CARE_PROVIDER_SITE_OTHER): Payer: Medicare Other | Admitting: Family Medicine

## 2012-11-08 ENCOUNTER — Encounter: Payer: Self-pay | Admitting: Family Medicine

## 2012-11-08 VITALS — BP 116/80 | HR 83 | Temp 99.9°F | Ht 61.0 in | Wt 168.2 lb

## 2012-11-08 DIAGNOSIS — R059 Cough, unspecified: Secondary | ICD-10-CM

## 2012-11-08 DIAGNOSIS — R05 Cough: Secondary | ICD-10-CM

## 2012-11-08 DIAGNOSIS — J029 Acute pharyngitis, unspecified: Secondary | ICD-10-CM

## 2012-11-08 DIAGNOSIS — R748 Abnormal levels of other serum enzymes: Secondary | ICD-10-CM

## 2012-11-08 DIAGNOSIS — H60399 Other infective otitis externa, unspecified ear: Secondary | ICD-10-CM

## 2012-11-08 DIAGNOSIS — H6092 Unspecified otitis externa, left ear: Secondary | ICD-10-CM

## 2012-11-08 LAB — POCT RAPID STREP A (OFFICE): Rapid Strep A Screen: NEGATIVE

## 2012-11-08 LAB — POCT INFLUENZA A/B
Influenza A, POC: NEGATIVE
Influenza B, POC: NEGATIVE

## 2012-11-08 MED ORDER — BENZONATATE 100 MG PO CAPS: 100.0000 mg | ORAL_CAPSULE | Freq: Two times a day (BID) | ORAL | Status: DC | PRN

## 2012-11-08 MED ORDER — NEOMYCIN-COLIST-HC-THONZONIUM 3.3-3-10-0.5 MG/ML OT SUSP: 3.0000 [drp] | Freq: Four times a day (QID) | OTIC | Status: DC

## 2012-11-08 MED ORDER — DOXYCYCLINE HYCLATE 100 MG PO TABS: 100.0000 mg | ORAL_TABLET | Freq: Two times a day (BID) | ORAL | Status: DC

## 2012-11-08 NOTE — Progress Notes (Signed)
  Subjective:    Patient ID: [name], female    DOB: April 30, 1965, 47 y.o.   MRN: [id number]  HPI  This 47 y.o. female presents for evaluation of cough, uri and right ear pain for the Last few days.  She has been coughing and she c/o congestion.  Review of Systems C/o left ear pain, congestion, and cough No chest pain, SOB, HA, dizziness, vision change, N/V, diarrhea, constipation, dysuria, urinary urgency or frequency, myalgias, arthralgias or rash.     Objective:   Physical Exam Vital signs noted  Well developed well nourished female.  HEENT - Head atraumatic Normocephalic                Eyes - PERRLA, Conjuctiva - clear Sclera- Clear EOMI                Ears - EAC decreased right ear TM's Wnl Gross Hearing WNL                Nose - Nares patent                 Throat - oropharanx wnl Respiratory - Lungs CTA bilateral Cardiac - RRR S1 and S2 without murmur GI - Abdomen soft Nontender and bowel sounds active x 4 Extremities - No edema. Neuro - Grossly intact.       Assessment & Plan:  Cough - Plan: POCT Influenza A/B, doxycycline (VIBRA-TABS) 100 MG tablet, benzonatate (TESSALON) 100 MG capsule  Sore throat - Plan: POCT rapid strep A, POCT Influenza A/B, doxycycline (VIBRA-TABS) 100 MG tablet  Left otitis externa - Plan: neomycin-colistin-hydrocortisone-thonzonium (CORTISPORIN-TC) 3.03-15-08-0.5 MG/ML otic suspension, doxycycline (VIBRA-TABS) 100 MG tablet  Deatra Canter FNP

## 2012-11-08 NOTE — Patient Instructions (Signed)

## 2012-11-08 NOTE — Addendum Note (Signed)
Addended by: Prescott Gum on: 11/08/2012 03:32 PM   Modules accepted: Orders

## 2012-11-09 LAB — CMP14+EGFR
ALT: 45 IU/L — ABNORMAL HIGH (ref 0–32)
AST: 27 IU/L (ref 0–40)
Albumin/Globulin Ratio: 2.7 — ABNORMAL HIGH (ref 1.1–2.5)
Albumin: 4.6 g/dL (ref 3.5–5.5)
Alkaline Phosphatase: 80 IU/L (ref 39–117)
BUN/Creatinine Ratio: 11 (ref 9–23)
BUN: 6 mg/dL (ref 6–24)
CO2: 25 mmol/L (ref 18–29)
Calcium: 8.9 mg/dL (ref 8.7–10.2)
Chloride: 103 mmol/L (ref 97–108)
Creatinine, Ser: 0.55 mg/dL — ABNORMAL LOW (ref 0.57–1.00)
GFR calc Af Amer: 129 mL/min/{1.73_m2} (ref >59)
GFR calc non Af Amer: 112 mL/min/{1.73_m2} (ref >59)
Globulin, Total: 1.7 g/dL (ref 1.5–4.5)
Glucose: 84 mg/dL (ref 65–99)
Potassium: 4.6 mmol/L (ref 3.5–5.2)
Sodium: 141 mmol/L (ref 134–144)
Total Bilirubin: 0.3 mg/dL (ref 0.0–1.2)
Total Protein: 6.3 g/dL (ref 6.0–8.5)

## 2012-11-09 LAB — HEPATITIS PANEL, ACUTE
Hep A IgM: NEGATIVE
Hep B C IgM: NEGATIVE
Hep C Virus Ab: <0.1 s/co ratio (ref 0.0–0.9)
Hepatitis B Surface Ag: NEGATIVE

## 2012-11-27 ENCOUNTER — Encounter (HOSPITAL_COMMUNITY): Payer: Self-pay | Admitting: Emergency Medicine

## 2012-11-27 ENCOUNTER — Emergency Department (HOSPITAL_COMMUNITY)
Admission: EM | Admit: 2012-11-27 | Discharge: 2012-11-27 | Disposition: A | Discharge status: Home / Self Care | Payer: Medicare Other | Attending: Emergency Medicine | Admitting: Emergency Medicine

## 2012-11-27 DIAGNOSIS — H6691 Otitis media, unspecified, right ear: Secondary | ICD-10-CM

## 2012-11-27 DIAGNOSIS — Z79899 Other long term (current) drug therapy: Secondary | ICD-10-CM | POA: Insufficient documentation

## 2012-11-27 DIAGNOSIS — E876 Hypokalemia: Secondary | ICD-10-CM

## 2012-11-27 DIAGNOSIS — K529 Noninfective gastroenteritis and colitis, unspecified: Secondary | ICD-10-CM

## 2012-11-27 DIAGNOSIS — F3289 Other specified depressive episodes: Secondary | ICD-10-CM | POA: Insufficient documentation

## 2012-11-27 DIAGNOSIS — K5289 Other specified noninfective gastroenteritis and colitis: Secondary | ICD-10-CM | POA: Insufficient documentation

## 2012-11-27 DIAGNOSIS — Z91040 Latex allergy status: Secondary | ICD-10-CM | POA: Insufficient documentation

## 2012-11-27 DIAGNOSIS — H6690 Otitis media, unspecified, unspecified ear: Secondary | ICD-10-CM | POA: Insufficient documentation

## 2012-11-27 DIAGNOSIS — J3489 Other specified disorders of nose and nasal sinuses: Secondary | ICD-10-CM | POA: Insufficient documentation

## 2012-11-27 DIAGNOSIS — F329 Major depressive disorder, single episode, unspecified: Secondary | ICD-10-CM | POA: Insufficient documentation

## 2012-11-27 DIAGNOSIS — Z792 Long term (current) use of antibiotics: Secondary | ICD-10-CM | POA: Insufficient documentation

## 2012-11-27 DIAGNOSIS — Z880 Allergy status to penicillin: Secondary | ICD-10-CM | POA: Insufficient documentation

## 2012-11-27 DIAGNOSIS — M1990 Unspecified osteoarthritis, unspecified site: Secondary | ICD-10-CM | POA: Insufficient documentation

## 2012-11-27 DIAGNOSIS — Z860100 Personal history of colon polyps, unspecified: Secondary | ICD-10-CM | POA: Insufficient documentation

## 2012-11-27 DIAGNOSIS — G40909 Epilepsy, unspecified, not intractable, without status epilepticus: Secondary | ICD-10-CM | POA: Insufficient documentation

## 2012-11-27 DIAGNOSIS — J42 Unspecified chronic bronchitis: Secondary | ICD-10-CM | POA: Insufficient documentation

## 2012-11-27 LAB — COMPREHENSIVE METABOLIC PANEL
ALT: 68 U/L — ABNORMAL HIGH (ref 0–35)
AST: 36 U/L (ref 0–37)
Albumin: 4.2 g/dL (ref 3.5–5.2)
Alkaline Phosphatase: 65 U/L (ref 39–117)
BUN: 7 mg/dL (ref 6–23)
CO2: 24 mEq/L (ref 19–32)
Calcium: 9.3 mg/dL (ref 8.4–10.5)
Chloride: 101 mEq/L (ref 96–112)
Creatinine, Ser: 0.68 mg/dL (ref 0.50–1.10)
GFR calc Af Amer: >90 mL/min (ref >90)
GFR calc non Af Amer: >90 mL/min (ref >90)
Glucose, Bld: 109 mg/dL — ABNORMAL HIGH (ref 70–99)
Potassium: 3.1 mEq/L — ABNORMAL LOW (ref 3.5–5.1)
Sodium: 139 mEq/L (ref 135–145)
Total Bilirubin: 0.4 mg/dL (ref 0.3–1.2)
Total Protein: 7.3 g/dL (ref 6.0–8.3)

## 2012-11-27 LAB — URINALYSIS, ROUTINE W REFLEX MICROSCOPIC
Bilirubin Urine: NEGATIVE
Glucose, UA: NEGATIVE mg/dL
Hgb urine dipstick: NEGATIVE
Ketones, ur: NEGATIVE mg/dL
Leukocytes, UA: NEGATIVE
Nitrite: NEGATIVE
Protein, ur: NEGATIVE mg/dL
Specific Gravity, Urine: <1.005 — ABNORMAL LOW (ref 1.005–1.030)
Urobilinogen, UA: 0.2 mg/dL (ref 0.0–1.0)
pH: 6 (ref 5.0–8.0)

## 2012-11-27 LAB — CBC WITH DIFFERENTIAL/PLATELET
Basophils Absolute: 0 10*3/uL (ref 0.0–0.1)
Basophils Relative: 0 % (ref 0–1)
Eosinophils Absolute: 0.1 10*3/uL (ref 0.0–0.7)
Eosinophils Relative: 1 % (ref 0–5)
HCT: 38.2 % (ref 36.0–46.0)
Hemoglobin: 13.1 g/dL (ref 12.0–15.0)
Lymphocytes Relative: 24 % (ref 12–46)
Lymphs Abs: 2.5 10*3/uL (ref 0.7–4.0)
MCH: 31 pg (ref 26.0–34.0)
MCHC: 34.3 g/dL (ref 30.0–36.0)
MCV: 90.5 fL (ref 78.0–100.0)
Monocytes Absolute: 0.7 10*3/uL (ref 0.1–1.0)
Monocytes Relative: 7 % (ref 3–12)
Neutro Abs: 7.3 10*3/uL (ref 1.7–7.7)
Neutrophils Relative %: 69 % (ref 43–77)
Platelets: 284 10*3/uL (ref 150–400)
RBC: 4.22 MIL/uL (ref 3.87–5.11)
RDW: 13.2 % (ref 11.5–15.5)
WBC: 10.6 10*3/uL — ABNORMAL HIGH (ref 4.0–10.5)

## 2012-11-27 MED ORDER — POTASSIUM CHLORIDE 20 MEQ PO PACK
20.0000 meq | PACK | Freq: Once | ORAL | Status: AC
  Administered 2012-11-27: 20 meq via ORAL
  Filled 2012-11-27: qty 1

## 2012-11-27 MED ORDER — LAMOTRIGINE 200 MG PO TABS
200.0000 mg | ORAL_TABLET | Freq: Once | ORAL | Status: AC
  Administered 2012-11-27: 200 mg via ORAL
  Filled 2012-11-27: qty 1

## 2012-11-27 MED ORDER — ONDANSETRON HCL 4 MG/2ML IJ SOLN
4.0000 mg | Freq: Once | INTRAMUSCULAR | Status: AC
  Administered 2012-11-27: 4 mg via INTRAVENOUS
  Filled 2012-11-27: qty 2

## 2012-11-27 MED ORDER — ONDANSETRON HCL 4 MG PO TABS: 4.0000 mg | ORAL_TABLET | Freq: Four times a day (QID) | ORAL | Status: DC

## 2012-11-27 MED ORDER — SODIUM CHLORIDE 0.9 % IV BOLUS (SEPSIS)
1000.0000 mL | Freq: Once | INTRAVENOUS | Status: AC
  Administered 2012-11-27: 1000 mL via INTRAVENOUS

## 2012-11-27 MED ORDER — SULFAMETHOXAZOLE-TRIMETHOPRIM 800-160 MG PO TABS: 1.0000 | ORAL_TABLET | Freq: Two times a day (BID) | ORAL | Status: AC

## 2012-11-27 NOTE — ED Notes (Signed)
Hope NP at bedside speaking with pt and family

## 2012-11-27 NOTE — ED Notes (Signed)
Pt c/o nausea, vomiting, diarrhea since yesterday. Pt states "I feel so sick and I feel like I'm about to have a seizure". Pt states she has been unable to eat "because everything just comes back up". Pt was recently seen by PCP for bronchitis and ear infection.

## 2012-11-27 NOTE — ED Notes (Signed)
Pt states "I took 800mg  of ibuprofen yesterday and I started feeling sick after".

## 2012-11-27 NOTE — ED Provider Notes (Signed)
CSN: [id number]     Arrival date & time 11/27/12  9562 History   First MD Initiated Contact with Patient 11/27/12 [phone]     Chief Complaint  Patient presents with  . Emesis  . Diarrhea   (Consider location/radiation/quality/duration/timing/severity/associated sxs/prior Treatment) Patient is a 47 y.o. female presenting with vomiting and diarrhea. The history is provided by the patient.  Emesis Severity:  Severe Duration:  24 hours Timing:  Sporadic Number of daily episodes:  5 Quality:  Stomach contents How soon after eating does vomiting occur:  30 minutes Progression:  Unchanged Chronicity:  New Recent urination:  Normal Relieved by:  None tried Worsened by:  Food smell and liquids Ineffective treatments:  Liquids Associated symptoms: chills, cough, diarrhea and URI   Associated symptoms: no fever, no headaches, no myalgias and no sore throat   Risk factors: no diabetes, not pregnant now, no sick contacts, no suspect food intake and no travel to endemic areas   Diarrhea Associated symptoms: chills, cough, URI and vomiting   Associated symptoms: no fever, no headaches and no myalgias    [name] is a 47 y.o. female who presents to the ED with nausea, vomiting and diarrhea that started yesterday. She took her seizure medication this morning then vomited immediately. She has not even been able to keep down liquids. Diarrhea x1 yesterday that was watery brown. No stool today. Saw her PCP a couple weeks ago for bronchitis and treated for bronchitis and ear infection with antibiotics but doing better from that.    Past Medical History  Diagnosis Date  . Vertigo   . Constipation   . Restless leg   . High cholesterol   . Chronic bronchitis     "yearly; when the weather changes" (04/22/2012)  . MVHQIONG(295.2)     "1/wk" (04/22/2012)  . Depression   . Epilepsy     "been having them right often here lately" (04/22/2012)  . Seizures   . Arthritis     "knees" (04/22/2012)  .  Osteoarthritis     Hattie Perch 04/22/2012  . Colon polyps     adenomatous and hyperplastic-  . Rectal bleed     in toilet- bright red   Past Surgical History  Procedure Laterality Date  . Total hip arthroplasty Left 1993; 1995; 2000  . Abdominal hysterectomy  2001  . Colonoscopy    . Joint replacement    . Mass excision  10/22/2011    Procedure: EXCISION MASS;  Surgeon: Shelly Rubenstein, MD;  Location: [facility] OR;  Service: General;  Laterality: Right;  excision right buttock mass  . Bladder suspension    . Cesarean section  1987; 1988  . Bunionectomy Left 2000   Family History  Problem Relation Age of Onset  . Cancer Mother   . Cancer Brother   . Diabetes Brother   . Cancer Maternal Aunt   . Diabetes Maternal Aunt    History  Substance Use Topics  . Smoking status: Never Smoker   . Smokeless tobacco: Never Used  . Alcohol Use: No   OB History   Grav Para Term Preterm Abortions TAB SAB Ect Mult Living                 Review of Systems  Constitutional: Positive for chills. Negative for fever.  HENT: Positive for ear pain and sinus pressure. Negative for sore throat.   Eyes: Negative for visual disturbance.  Respiratory: Positive for cough.   Gastrointestinal: Positive for vomiting  and diarrhea.  Genitourinary: Negative for dysuria, frequency, vaginal bleeding and vaginal discharge.  Musculoskeletal: Negative for back pain and myalgias.  Skin: Negative for rash and wound.  Allergic/Immunologic: Negative for immunocompromised state.  Neurological: Negative for headaches. Seizures: history of   Psychiatric/Behavioral: The patient is not nervous/anxious.     Allergies  Vimpat; Benadryl; Betadine; Codeine; Dilantin; Melatonin; Ultram; Vicodin; Latex; Penicillins; and Tape  Home Medications   Current Outpatient Rx  Name  Route  Sig  Dispense  Refill  . benzonatate (TESSALON) 100 MG capsule   Oral   Take 1 capsule (100 mg total) by mouth 2 (two) times daily as needed for  cough.   30 capsule   1   . clonazePAM (KLONOPIN) 0.5 MG tablet   Oral   Take 2 tablets (1 mg total) by mouth 2 (two) times daily. Takes 0.5mg  in the morning ( one half tab )  & 1mg  at night by mouth.   45 tablet   0   . doxycycline (VIBRA-TABS) 100 MG tablet   Oral   Take 1 tablet (100 mg total) by mouth 2 (two) times daily.   20 tablet   0   . FLUoxetine (PROZAC) 20 MG capsule   Oral   Take 60 mg by mouth every morning.         . gabapentin (NEURONTIN) 600 MG tablet   Oral   Take 1 tablet (600 mg total) by mouth 5 (five) times daily. Takes 600mg  3x daily & 1200mg  at night   450 tablet   1   . ibuprofen (ADVIL,MOTRIN) 800 MG tablet               . lamoTRIgine (LAMICTAL) 100 MG tablet   Oral   Take 1 tablet (100 mg total) by mouth 2 (two) times daily.   60 tablet   0   . neomycin-colistin-hydrocortisone-thonzonium (CORTISPORIN-TC) 3.03-15-08-0.5 MG/ML otic suspension   Right Ear   Place 3 drops into the right ear 4 (four) times daily.   10 mL   0   . pramipexole (MIRAPEX) 0.125 MG tablet               . tamsulosin (FLOMAX) 0.4 MG CAPS capsule               . VOLTAREN 1 % GEL                BP 115/75  Pulse 80  Temp(Src) 98.6 F (37 C) (Oral)  Resp 18  SpO2 96% Physical Exam  Nursing note and vitals reviewed. Constitutional: She is oriented to person, place, and time. She appears well-developed and well-nourished. No distress.  HENT:  Head: Normocephalic and atraumatic.    Right Ear: No mastoid tenderness. Tympanic membrane is erythematous and retracted.  Left Ear: Tympanic membrane normal.  Tender on palpation below right ear.  Eyes: Conjunctivae and EOM are normal. Pupils are equal, round, and reactive to light.  Neck: Neck supple.  Cardiovascular: Normal rate, regular rhythm and normal heart sounds.   Pulmonary/Chest: Effort normal and breath sounds normal.  Abdominal: Soft. Normal appearance. Bowel sounds are increased. There is  tenderness in the right lower quadrant, suprapubic area and left lower quadrant. There is no rigidity, no rebound, no guarding and no CVA tenderness.  Mildly tender with deep palpation lower abdomen.  Musculoskeletal: Normal range of motion.  Neurological: She is alert and oriented to person, place, and time. No cranial nerve deficit.  Skin: Skin  is warm and dry.  Psychiatric: She has a normal mood and affect. Her behavior is normal.    ED Course: I discussed this case with Dr. Estell Harpin  Procedures  Results for orders placed during the hospital encounter of 11/27/12 (from the past 24 hour(s))  CBC WITH DIFFERENTIAL     Status: Abnormal   Collection Time    11/27/12 10:06 AM      Result Value Range   WBC 10.6 (*) 4.0 - 10.5 K/uL   RBC 4.22  3.87 - 5.11 MIL/uL   Hemoglobin 13.1  12.0 - 15.0 g/dL   HCT 04.5  40.9 - 81.1 %   MCV 90.5  78.0 - 100.0 fL   MCH 31.0  26.0 - 34.0 pg   MCHC 34.3  30.0 - 36.0 g/dL   RDW 91.4  78.2 - 95.6 %   Platelets 284  150 - 400 K/uL   Neutrophils Relative % 69  43 - 77 %   Neutro Abs 7.3  1.7 - 7.7 K/uL   Lymphocytes Relative 24  12 - 46 %   Lymphs Abs 2.5  0.7 - 4.0 K/uL   Monocytes Relative 7  3 - 12 %   Monocytes Absolute 0.7  0.1 - 1.0 K/uL   Eosinophils Relative 1  0 - 5 %   Eosinophils Absolute 0.1  0.0 - 0.7 K/uL   Basophils Relative 0  0 - 1 %   Basophils Absolute 0.0  0.0 - 0.1 K/uL  COMPREHENSIVE METABOLIC PANEL     Status: Abnormal   Collection Time    11/27/12 10:06 AM      Result Value Range   Sodium 139  135 - 145 mEq/L   Potassium 3.1 (*) 3.5 - 5.1 mEq/L   Chloride 101  96 - 112 mEq/L   CO2 24  19 - 32 mEq/L   Glucose, Bld 109 (*) 70 - 99 mg/dL   BUN 7  6 - 23 mg/dL   Creatinine, Ser 2.13  0.50 - 1.10 mg/dL   Calcium 9.3  8.4 - 08.6 mg/dL   Total Protein 7.3  6.0 - 8.3 g/dL   Albumin 4.2  3.5 - 5.2 g/dL   AST 36  0 - 37 U/L   ALT 68 (*) 0 - 35 U/L   Alkaline Phosphatase 65  39 - 117 U/L   Total Bilirubin 0.4  0.3 - 1.2 mg/dL     GFR calc non Af Amer >90  >90 mL/min   GFR calc Af Amer >90  >90 mL/min    After IV hydration,  Zofran the patient is feeling much better. No nausea at this time, was able to take potassium PO without vomiting. She has had no seizure activity, however, since she reports vomiting her seizure medication this morning we have given her Lamictal 200 mg which was her morning dose.  She is stable for discharge home without any immediate complications. Will treat her persistent otitis media with Bactrim and give Rx for Zofran for her nausea. She is to follow up with her PCP on Monday or return here if symptoms worsen. She will follow a clear liquid diet today and advance to the B.R.A.T diet as tolerated.  Discussed with the patient and all questioned fully answered.   Medication List    TAKE these medications       ondansetron 4 MG tablet  Commonly known as:  ZOFRAN  Take 1 tablet (4 mg total) by mouth every 6 (  six) hours.     sulfamethoxazole-trimethoprim 800-160 MG per tablet  Commonly known as:  BACTRIM DS,SEPTRA DS  Take 1 tablet by mouth 2 (two) times daily.      ASK your doctor about these medications       ALIVE ONCE DAILY WOMENS 50+ PO  Take 1 tablet by mouth daily.     clonazePAM 0.5 MG tablet  Commonly known as:  KLONOPIN  Take 0.5-1 mg by mouth 2 (two) times daily. Takes 0.5mg  & 1mg  at night by mouth.     FLUoxetine 20 MG capsule  Commonly known as:  PROZAC  Take 60 mg by mouth every morning.     gabapentin 600 MG tablet  Commonly known as:  NEURONTIN  Take 600-1,200 mg by mouth 3 (three) times daily. Takes 600mg  3x daily & 1200mg  at night     ibuprofen 800 MG tablet  Commonly known as:  ADVIL,MOTRIN  Take 800 mg by mouth 3 (three) times daily as needed for headache.     lamoTRIgine 100 MG tablet  Commonly known as:  LAMICTAL  Take 1 tablet (100 mg total) by mouth 2 (two) times daily.     pramipexole 0.125 MG tablet  Commonly known as:  MIRAPEX  Take 0.125 mg by  mouth at bedtime.     vitamin B-12 1000 MCG tablet  Commonly known as:  CYANOCOBALAMIN  Take 1,000 mcg by mouth daily.          [facility] Orlene Och, NP 11/27/12 [phone]

## 2012-11-30 NOTE — ED Provider Notes (Signed)
Medical screening examination/treatment/procedure(s) were performed by non-physician practitioner and as supervising physician I was immediately available for consultation/collaboration.  EKG Interpretation   None         Pepper Kerrick L Millie Shorb, MD 11/30/12 1109 

## 2012-12-08 ENCOUNTER — Emergency Department (HOSPITAL_COMMUNITY)
Admission: EM | Admit: 2012-12-08 | Discharge: 2012-12-08 | Disposition: A | Discharge status: Home / Self Care | Payer: Medicare Other | Attending: Emergency Medicine | Admitting: Emergency Medicine

## 2012-12-08 ENCOUNTER — Encounter (HOSPITAL_COMMUNITY): Payer: Self-pay | Admitting: Emergency Medicine

## 2012-12-08 DIAGNOSIS — G2581 Restless legs syndrome: Secondary | ICD-10-CM | POA: Insufficient documentation

## 2012-12-08 DIAGNOSIS — G40909 Epilepsy, unspecified, not intractable, without status epilepticus: Secondary | ICD-10-CM | POA: Insufficient documentation

## 2012-12-08 DIAGNOSIS — Z8639 Personal history of other endocrine, nutritional and metabolic disease: Secondary | ICD-10-CM | POA: Insufficient documentation

## 2012-12-08 DIAGNOSIS — Z79899 Other long term (current) drug therapy: Secondary | ICD-10-CM | POA: Insufficient documentation

## 2012-12-08 DIAGNOSIS — Z8719 Personal history of other diseases of the digestive system: Secondary | ICD-10-CM | POA: Insufficient documentation

## 2012-12-08 DIAGNOSIS — Z862 Personal history of diseases of the blood and blood-forming organs and certain disorders involving the immune mechanism: Secondary | ICD-10-CM | POA: Insufficient documentation

## 2012-12-08 DIAGNOSIS — M1710 Unilateral primary osteoarthritis, unspecified knee: Secondary | ICD-10-CM | POA: Insufficient documentation

## 2012-12-08 DIAGNOSIS — R569 Unspecified convulsions: Secondary | ICD-10-CM

## 2012-12-08 DIAGNOSIS — M25519 Pain in unspecified shoulder: Secondary | ICD-10-CM | POA: Insufficient documentation

## 2012-12-08 DIAGNOSIS — F329 Major depressive disorder, single episode, unspecified: Secondary | ICD-10-CM | POA: Insufficient documentation

## 2012-12-08 DIAGNOSIS — Z860100 Personal history of colon polyps, unspecified: Secondary | ICD-10-CM | POA: Insufficient documentation

## 2012-12-08 DIAGNOSIS — M1990 Unspecified osteoarthritis, unspecified site: Secondary | ICD-10-CM | POA: Insufficient documentation

## 2012-12-08 DIAGNOSIS — Z8709 Personal history of other diseases of the respiratory system: Secondary | ICD-10-CM | POA: Insufficient documentation

## 2012-12-08 DIAGNOSIS — Z91040 Latex allergy status: Secondary | ICD-10-CM | POA: Insufficient documentation

## 2012-12-08 DIAGNOSIS — F3289 Other specified depressive episodes: Secondary | ICD-10-CM | POA: Insufficient documentation

## 2012-12-08 DIAGNOSIS — Z880 Allergy status to penicillin: Secondary | ICD-10-CM | POA: Insufficient documentation

## 2012-12-08 DIAGNOSIS — J029 Acute pharyngitis, unspecified: Secondary | ICD-10-CM | POA: Insufficient documentation

## 2012-12-08 LAB — CBC WITH DIFFERENTIAL/PLATELET
Basophils Absolute: 0 10*3/uL (ref 0.0–0.1)
Basophils Relative: 0 % (ref 0–1)
Eosinophils Absolute: 0.2 10*3/uL (ref 0.0–0.7)
Eosinophils Relative: 2 % (ref 0–5)
HCT: 37.6 % (ref 36.0–46.0)
Hemoglobin: 12.9 g/dL (ref 12.0–15.0)
Lymphocytes Relative: 26 % (ref 12–46)
Lymphs Abs: 2.5 10*3/uL (ref 0.7–4.0)
MCH: 31.4 pg (ref 26.0–34.0)
MCHC: 34.3 g/dL (ref 30.0–36.0)
MCV: 91.5 fL (ref 78.0–100.0)
Monocytes Absolute: 0.8 10*3/uL (ref 0.1–1.0)
Monocytes Relative: 8 % (ref 3–12)
Neutro Abs: 6.1 10*3/uL (ref 1.7–7.7)
Neutrophils Relative %: 64 % (ref 43–77)
Platelets: 274 10*3/uL (ref 150–400)
RBC: 4.11 MIL/uL (ref 3.87–5.11)
RDW: 13.4 % (ref 11.5–15.5)
WBC: 9.5 10*3/uL (ref 4.0–10.5)

## 2012-12-08 LAB — URINALYSIS, ROUTINE W REFLEX MICROSCOPIC
Bilirubin Urine: NEGATIVE
Glucose, UA: NEGATIVE mg/dL
Hgb urine dipstick: NEGATIVE
Ketones, ur: NEGATIVE mg/dL
Leukocytes, UA: NEGATIVE
Nitrite: NEGATIVE
Protein, ur: NEGATIVE mg/dL
Specific Gravity, Urine: 1.02 (ref 1.005–1.030)
Urobilinogen, UA: 0.2 mg/dL (ref 0.0–1.0)
pH: 6.5 (ref 5.0–8.0)

## 2012-12-08 LAB — BASIC METABOLIC PANEL
BUN: 12 mg/dL (ref 6–23)
CO2: 24 mEq/L (ref 19–32)
Calcium: 9.8 mg/dL (ref 8.4–10.5)
Chloride: 105 mEq/L (ref 96–112)
Creatinine, Ser: 0.77 mg/dL (ref 0.50–1.10)
GFR calc Af Amer: >90 mL/min (ref >90)
GFR calc non Af Amer: >90 mL/min (ref >90)
Glucose, Bld: 104 mg/dL — ABNORMAL HIGH (ref 70–99)
Potassium: 3.9 mEq/L (ref 3.5–5.1)
Sodium: 142 mEq/L (ref 135–145)

## 2012-12-08 LAB — RAPID URINE DRUG SCREEN, HOSP PERFORMED
Amphetamines: NOT DETECTED
Barbiturates: NOT DETECTED
Benzodiazepines: NOT DETECTED
Cocaine: NOT DETECTED
Opiates: POSITIVE — AB
Tetrahydrocannabinol: NOT DETECTED

## 2012-12-08 NOTE — ED Notes (Signed)
Pt was at [facility] for drug addiction - first seizure unwitnessed.  2nd and 3rd seizures witnessed by EMS lasting approx 15-20 seconds.  EMS reports immediately after seizure pt was alert and oriented x 4.  Last seizure pt suddenly went unresponsive, administered ammonia stick with patient response.  Pt alert and oriented x 4 at this time.

## 2012-12-08 NOTE — ED Notes (Signed)
Pt unable to void at this time.  Given water to drink.  Informed pt if unable to void, will catheterize.  Verbalized understanding.

## 2012-12-08 NOTE — ED Provider Notes (Signed)
CSN: [id number]     Arrival date & time 12/08/12  1210 History   This chart was scribed for Gilda Crease, MD by Caryn Bee, ED Scribe. This patient was seen in room APA14/APA14 and the patient's care was started 12:05 PM.    Chief Complaint  Patient presents with  . Seizures   HPI HPI Comments: [name] is a 47 y.o. female brought in by ambulance, who presents to the Emergency Department from [facility] complaining of seizure like episodes. Per EMS the 1st episode was not witnessed by a nurse. During the 2nd episode pt made twitching motions. The 3rd episode seemed similar to a grand mal seizure. EMS was present for the 4th episode where the pt simply went completely stiff. This was stopped with ammonia inhalant. Pt has been disoriented and has had slurred speech after the episodes. She has h/o seizures. Pt also complains of sore throat and left shoulder pain. She denies cough or emesis.    Past Medical History  Diagnosis Date  . Vertigo   . Constipation   . Restless leg   . High cholesterol   . Chronic bronchitis     "yearly; when the weather changes" (04/22/2012)  . WUJWJXBJ(478.2)     "1/wk" (04/22/2012)  . Depression   . Epilepsy     "been having them right often here lately" (04/22/2012)  . Seizures   . Arthritis     "knees" (04/22/2012)  . Osteoarthritis     Hattie Perch 04/22/2012  . Colon polyps     adenomatous and hyperplastic-  . Rectal bleed     in toilet- bright red   Past Surgical History  Procedure Laterality Date  . Total hip arthroplasty Left 1993; 1995; 2000  . Abdominal hysterectomy  2001  . Colonoscopy    . Joint replacement    . Mass excision  10/22/2011    Procedure: EXCISION MASS;  Surgeon: Shelly Rubenstein, MD;  Location: [facility] OR;  Service: General;  Laterality: Right;  excision right buttock mass  . Bladder suspension    . Cesarean section  1987; 1988  . Bunionectomy Left 2000   Family History  Problem Relation Age of Onset  . Cancer  Mother   . Cancer Brother   . Diabetes Brother   . Cancer Maternal Aunt   . Diabetes Maternal Aunt    History  Substance Use Topics  . Smoking status: Never Smoker   . Smokeless tobacco: Never Used  . Alcohol Use: No   OB History   Grav Para Term Preterm Abortions TAB SAB Ect Mult Living                 Review of Systems  HENT: Positive for sore throat.   Respiratory: Negative for cough.   Gastrointestinal: Negative for vomiting.  Musculoskeletal: Positive for arthralgias (left shoulder).  Neurological: Positive for seizures.  All other systems reviewed and are negative.    Allergies  Vimpat; Benadryl; Dilantin; Melatonin; Ultram; Vicodin; Betadine; Codeine; Latex; Penicillins; and Tape  Home Medications   Current Outpatient Rx  Name  Route  Sig  Dispense  Refill  . clonazePAM (KLONOPIN) 0.5 MG tablet   Oral   Take 0.5-1 mg by mouth 2 (two) times daily. Takes 0.5mg  & 1mg  at night by mouth.         Marland Kitchen FLUoxetine (PROZAC) 20 MG capsule   Oral   Take 60 mg by mouth every morning.         Marland Kitchen  gabapentin (NEURONTIN) 600 MG tablet   Oral   Take 600-1,200 mg by mouth 3 (three) times daily. Takes 600mg  3x daily & 1200mg  at night         . ibuprofen (ADVIL,MOTRIN) 800 MG tablet   Oral   Take 800 mg by mouth 3 (three) times daily as needed for headache.          . lamoTRIgine (LAMICTAL) 100 MG tablet   Oral   Take 1 tablet (100 mg total) by mouth 2 (two) times daily.   60 tablet   0   . Multiple Vitamins-Minerals (ALIVE ONCE DAILY WOMENS 50+ PO)   Oral   Take 1 tablet by mouth daily.         . ondansetron (ZOFRAN) 4 MG tablet   Oral   Take 1 tablet (4 mg total) by mouth every 6 (six) hours.   20 tablet   0   . pramipexole (MIRAPEX) 0.125 MG tablet   Oral   Take 0.125 mg by mouth at bedtime.          . vitamin B-12 (CYANOCOBALAMIN) 1000 MCG tablet   Oral   Take 1,000 mcg by mouth daily.          BP 113/74  Pulse 97  Temp(Src) 98.6 F (37  C) (Oral)  Resp 18  SpO2 97%  Physical Exam  Nursing note and vitals reviewed. Constitutional: She is oriented to person, place, and time. She appears well-developed and well-nourished. No distress.  HENT:  Head: Normocephalic and atraumatic.  Right Ear: Hearing normal.  Left Ear: Hearing normal.  Nose: Nose normal.  Mouth/Throat: Oropharynx is clear and moist and mucous membranes are normal.  Eyes: Conjunctivae and EOM are normal. Pupils are equal, round, and reactive to light.  Neck: Normal range of motion. Neck supple.  Cardiovascular: Normal rate, regular rhythm, S1 normal, S2 normal and normal heart sounds.  Exam reveals no gallop and no friction rub.   No murmur heard. Pulmonary/Chest: Effort normal and breath sounds normal. No respiratory distress. She has no wheezes. She has no rales. She exhibits no tenderness.  Abdominal: Soft. Normal appearance and bowel sounds are normal. There is no hepatosplenomegaly. There is no tenderness. There is no rebound, no guarding, no tenderness at McBurney's point and negative Murphy's sign. No hernia.  Musculoskeletal: Normal range of motion. She exhibits tenderness.  Tenderness to left shoulder.  Neurological: She is alert and oriented to person, place, and time. She has normal strength. No cranial nerve deficit or sensory deficit. Coordination normal. GCS eye subscore is 4. GCS verbal subscore is 5. GCS motor subscore is 6.  Skin: Skin is warm, dry and intact. No rash noted. No cyanosis.  Psychiatric: She has a normal mood and affect. Her speech is normal and behavior is normal. Thought content normal.    ED Course  Procedures (including critical care time) DIAGNOSTIC STUDIES: Oxygen Saturation is 97% on room air, normal by my interpretation.    COORDINATION OF CARE: 12:12 PM-Discussed treatment plan with pt at bedside and pt agreed to plan.   Labs Review Labs Reviewed  BASIC METABOLIC PANEL - Abnormal; Notable for the following:     Glucose, Bld 104 (*)    All other components within normal limits  URINE RAPID DRUG SCREEN (HOSP PERFORMED) - Abnormal; Notable for the following:    Opiates POSITIVE (*)    All other components within normal limits  CBC WITH DIFFERENTIAL  URINALYSIS, ROUTINE W REFLEX MICROSCOPIC  Imaging Review No results found.  EKG Interpretation   None       MDM  Diagnosis: Seizure  Patient presents to the ER for evaluation of possible seizures. Patient does have a seizure disorder. Patient had 4 episodes today of seizure-like activity, but do not sound convincing for actual seizure. EMS witnessed one of the 4 episodes. Patient had closed eyes was shaking all over, but immediately responded and pushed ammonia inhalant away from her nose during the episode and the shaking stopped. 2 of the other episodes were irregular shaking of her extremities while awake and alert. Patient's blood work is unremarkable. She has been seizure free here in the ER. She'll be discharged, continue her normal medications including her Lamictal.  I personally performed the services described in this documentation, which was scribed in my presence. The recorded information has been reviewed and is accurate.     Gilda Crease, MD 12/08/12 [phone]

## 2012-12-15 ENCOUNTER — Telehealth: Payer: Self-pay | Admitting: Neurology

## 2012-12-15 NOTE — Telephone Encounter (Signed)
Pt needs to schedule follow up appointment as soon as possible with Dr. Vickey Huger since she was discharged from the hospital today. Nothing was available as soon as possible. Please advise.

## 2012-12-16 NOTE — Telephone Encounter (Signed)
Correcting again : She was a Dr Sandria Manly patient and referred by PCP in May to [facility]. I have seen her and had long discussions with her. Her medications that are not for seizures will not be filled by [name]. Since she has had multiple Ed visits, and related neurological status changes, she should be able to wait 2-3 weeks for a RV. Left VM. CD

## 2012-12-16 NOTE — Telephone Encounter (Signed)
i have reviewed her records - she is seen at [facility] for years and never was seen here?

## 2012-12-16 NOTE — Telephone Encounter (Signed)
Please use first available. Thank you - Why wouldn't she see a NP?

## 2012-12-16 NOTE — Telephone Encounter (Signed)
Spoke to patient. Went over ED follow up notes with patient. Patient says she would like to f/u w/ neurologist anyway. Advsd would fwd to Dr. Vickey Huger. Patient agreed.

## 2012-12-31 ENCOUNTER — Ambulatory Visit (INDEPENDENT_AMBULATORY_CARE_PROVIDER_SITE_OTHER): Payer: Medicare Other | Admitting: General Practice

## 2012-12-31 VITALS — BP 107/80 | HR 87 | Temp 98.1°F | Ht 61.0 in | Wt 172.0 lb

## 2012-12-31 DIAGNOSIS — R52 Pain, unspecified: Secondary | ICD-10-CM

## 2012-12-31 DIAGNOSIS — R059 Cough, unspecified: Secondary | ICD-10-CM

## 2012-12-31 DIAGNOSIS — J029 Acute pharyngitis, unspecified: Secondary | ICD-10-CM

## 2012-12-31 DIAGNOSIS — R05 Cough: Secondary | ICD-10-CM

## 2012-12-31 DIAGNOSIS — J0100 Acute maxillary sinusitis, unspecified: Secondary | ICD-10-CM

## 2012-12-31 LAB — POCT INFLUENZA A/B
Influenza A, POC: NEGATIVE
Influenza B, POC: NEGATIVE

## 2012-12-31 LAB — POCT RAPID STREP A (OFFICE): Rapid Strep A Screen: NEGATIVE

## 2012-12-31 MED ORDER — PREDNISONE (PAK) 10 MG PO TABS: ORAL_TABLET | ORAL | Status: DC

## 2012-12-31 MED ORDER — ALBUTEROL SULFATE HFA 108 (90 BASE) MCG/ACT IN AERS: 2.0000 | INHALATION_SPRAY | Freq: Four times a day (QID) | RESPIRATORY_TRACT | Status: DC | PRN

## 2012-12-31 MED ORDER — METHYLPREDNISOLONE ACETATE 80 MG/ML IJ SUSP: 80.0000 mg | Freq: Once | INTRAMUSCULAR | Status: DC

## 2012-12-31 MED ORDER — AZITHROMYCIN 250 MG PO TABS: ORAL_TABLET | ORAL | Status: DC

## 2012-12-31 MED ORDER — BENZONATATE 100 MG PO CAPS: 100.0000 mg | ORAL_CAPSULE | Freq: Two times a day (BID) | ORAL | Status: DC | PRN

## 2012-12-31 NOTE — Progress Notes (Signed)
   Subjective:    Patient ID: [name], female    DOB: 06/07/1965, 47 y.o.   MRN: [id number]  Cough This is a new problem. The current episode started in the past 7 days. The problem has been unchanged. The cough is non-productive. Associated symptoms include a sore throat. Pertinent negatives include no chest pain, chills, fever, headaches, nasal congestion, postnasal drip, shortness of breath or wheezing. The symptoms are aggravated by lying down. She has tried nothing for the symptoms. Her past medical history is significant for bronchitis. There is no history of asthma or pneumonia.      Review of Systems  Constitutional: Negative for fever and chills.  HENT: Positive for congestion and sore throat. Negative for postnasal drip and sinus pressure.   Respiratory: Positive for cough. Negative for chest tightness, shortness of breath and wheezing.   Cardiovascular: Negative for chest pain and palpitations.  Genitourinary: Negative for difficulty urinating.  Neurological: Negative for dizziness, weakness and headaches.       Objective:   Physical Exam  Constitutional: She is oriented to person, place, and time. She appears well-developed and well-nourished.  HENT:  Head: Normocephalic and atraumatic.  Nose: Right sinus exhibits maxillary sinus tenderness and frontal sinus tenderness. Left sinus exhibits maxillary sinus tenderness and frontal sinus tenderness.  Mouth/Throat: Posterior oropharyngeal erythema present.  Neck: Normal range of motion. Neck supple. No thyromegaly present.  Cardiovascular: Normal rate, regular rhythm and normal heart sounds.   Pulmonary/Chest: Effort normal and breath sounds normal. No respiratory distress. She exhibits no tenderness.  Lymphadenopathy:    She has no cervical adenopathy.  Neurological: She is alert and oriented to person, place, and time.  Skin: Skin is warm and dry. No rash noted.  Psychiatric: She has a normal mood and affect.    Results for orders placed in visit on 12/31/12  POCT RAPID STREP A (OFFICE)      Result Value Range   Rapid Strep A Screen Negative  Negative  POCT INFLUENZA A/B      Result Value Range   Influenza A, POC Negative     Influenza B, POC Negative            Assessment & Plan:  1. Sore throat  - POCT rapid strep A  2. Body aches  - POCT Influenza A/B  3. Cough  - benzonatate (TESSALON) 100 MG capsule; Take 1 capsule (100 mg total) by mouth 2 (two) times daily as needed for cough.  Dispense: 20 capsule; Refill: 0  4. Sinusitis, acute maxillary  - azithromycin (ZITHROMAX) 250 MG tablet; Take as directed  Dispense: 6 tablet; Refill: 0 -adequate fluids -RTO if symptoms worsen or unresolved -Patient verbalized understanding Coralie Keens, FNP-C

## 2012-12-31 NOTE — Patient Instructions (Addendum)

## 2013-01-12 ENCOUNTER — Telehealth: Payer: Self-pay | Admitting: General Practice

## 2013-01-14 ENCOUNTER — Telehealth: Payer: Self-pay | Admitting: General Practice

## 2013-01-14 ENCOUNTER — Other Ambulatory Visit: Payer: Self-pay | Admitting: General Practice

## 2013-01-14 ENCOUNTER — Other Ambulatory Visit: Payer: Self-pay | Admitting: Neurology

## 2013-01-14 MED ORDER — CLONAZEPAM 0.5 MG PO TABS: 0.5000 mg | ORAL_TABLET | Freq: Two times a day (BID) | ORAL | Status: DC

## 2013-01-14 MED ORDER — CLONAZEPAM 0.5 MG PO TABS: 0.5000 mg | ORAL_TABLET | Freq: Three times a day (TID) | ORAL | Status: DC | PRN

## 2013-01-14 NOTE — Telephone Encounter (Signed)
Patient received prescription from neurologist.

## 2013-01-18 ENCOUNTER — Telehealth: Payer: Self-pay

## 2013-01-18 NOTE — Telephone Encounter (Signed)
Patient called stating she wants a 90 day Rx for Klonopin.  This is not something we are prescribing.  Per previous phone notes:  [name], MD at 09/30/2012 12:05 PM     Status: Signed        The provider is mental health at the center she is seen for depression treatment. This is were she needs to refill - if the provider is not longer there, another provider in the [facility] would need to take it over.  The klonopin can indeed help with seizures , but was prescribed as an antianxiety medication by mental health , it seems.  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 CD      [name], MD at 12/16/2012 5:46 PM     Status: Signed        Correcting again : She was a Dr Erling Cruz patient and referred by PCP in May to [facility]. I have seen her and had long discussions with her. Her medications that are not for seizures will not be filled by [name]. Since she has had multiple Ed visits, and related neurological status changes, she should be able to wait 2-3 weeks for a RV. Left VM. CD   I spoke with patient and explained she will need to get refills from provider that previously prescribed this medication.  She verbalized understanding.

## 2013-02-01 ENCOUNTER — Telehealth: Payer: Self-pay | Admitting: *Deleted

## 2013-02-01 NOTE — Telephone Encounter (Signed)
Patient is requesting klonipam refill, has a scheduled appt

## 2013-02-01 NOTE — Telephone Encounter (Signed)
sched appt/confirmed

## 2013-02-01 NOTE — Telephone Encounter (Signed)
Dr Janann Colonel already phone in a Rx for this patient on 01/02.

## 2013-02-02 ENCOUNTER — Encounter (INDEPENDENT_AMBULATORY_CARE_PROVIDER_SITE_OTHER): Payer: Self-pay

## 2013-02-02 ENCOUNTER — Ambulatory Visit (INDEPENDENT_AMBULATORY_CARE_PROVIDER_SITE_OTHER): Payer: Medicare Other | Admitting: Nurse Practitioner

## 2013-02-02 ENCOUNTER — Other Ambulatory Visit: Payer: Self-pay | Admitting: Nurse Practitioner

## 2013-02-02 ENCOUNTER — Encounter: Payer: Self-pay | Admitting: Nurse Practitioner

## 2013-02-02 VITALS — BP 108/74 | HR 79 | Ht 60.5 in | Wt 176.0 lb

## 2013-02-02 DIAGNOSIS — G40802 Other epilepsy, not intractable, without status epilepticus: Secondary | ICD-10-CM

## 2013-02-02 DIAGNOSIS — R569 Unspecified convulsions: Secondary | ICD-10-CM

## 2013-02-02 MED ORDER — LEVETIRACETAM 500 MG PO TABS: 500.0000 mg | ORAL_TABLET | Freq: Two times a day (BID) | ORAL | Status: DC

## 2013-02-02 MED ORDER — LAMOTRIGINE 100 MG PO TABS: 100.0000 mg | ORAL_TABLET | Freq: Two times a day (BID) | ORAL | Status: DC

## 2013-02-02 NOTE — Patient Instructions (Signed)
Continue Lamotrigine (Lamictal) at current dose.  We will check your lab level today.  Start Levetiracetam (Keppra) 500 mg, 1 tablet every 12 hours.  Side effects to watch for are worsening depression, suicidal feelings.  Please report these feelings to our office right away or go to the ER.  Continue regular follow up with your Psychiatrist.  Follow up with Dr. Brett Fairy in 3 months.

## 2013-02-02 NOTE — Progress Notes (Signed)
PATIENT: [name] DOB: 09-01-1965   REASON FOR VISIT: follow up for seizures and other convulsions HISTORY FROM: patient  HISTORY OF PRESENT ILLNESS: [name] is a 48 y.o. female here as a referral from Dr. Laurance Flatten for spells. She was last seen by Dr. love on 01/12/2012. The patient's chief complaint at the time was dizziness she has a history of new onset generalized tonic-clonic seizures beginning 10-12-2000 accompanied by an abnormal EEG showing generalized sharp wave discharges at 3 Hz. This is consistent with a primary generalized seizure disorder. She was initially treated with Dilantin and then changed to Depakote ER. 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 Because of and streaky change topiramate tolerated. She was referred to Dr. Elisabeth Cara at [facility] documented nonepileptic seizures. A 24 hour prolonged EEG monitoring and 2004 showed that the patient had both, pseudoseizures and seizures with generalized epilepsy. In August 2012 she had a repeated seizure activity during her dentist office visit. Blood studies, MRI and CT were normal EEG shorted and spike and wave complexes she was discharged on November Dr. love has recorded that she has to spells per month on average she is [facility]. And takes Prozac. Her liver function tests were often elevated 11/22/2011 her AST was 65 and ALT 85 her last ambulatory EEG September 2013 showed abundant generalized 3-5 Hz spikes and polyspike waves.  Current medications include: 100 mg of Lamictal twice a day, Prozac 60 mg a day , Gabapentin 800 mg, 5 tablets per day Klonipin 0.5 3 times a day - she is no longer using VIMPAT which Dr. Erling Cruz discontinued.   UPDATE 02/02/13 (LL):  [name] returns to our office for follow up of  hospital visit for seizure.  She had hospital admission at [facility] on 12/14/12, after having seizure in pre-op there for foot surgery.  The foot surgery was rescheduled.  Her husband reports that she has an average of 1-4 seizures per month.  She reports that she did the best in the past on Keppra and Lamictal, but Keppra was too expensive.  She has not been on generic Keppra to her knowledge.  She reports last seeing psychiatrist on December 12.  She asks to have her Klonopin refilled.  Records reviewed from [facility], last seen by Dr. Inocente Salles in June 2014, where Dr. Inocente Salles stated that she will only be seen prn there, patient chooses to be followed by Dr. Brett Fairy for seizures.  REVIEW OF SYSTEMS: Full 14 system review of systems performed and notable only for:  Constitutional: fatigue Respiratory: cough Gastroitestinal: constipation  Genitourinary: difficulty urinating  Neurological: memory loss, dizziness, headache, seizure, tremors, passing out Psychiatric: confusion, depression Sleep: restless legs  ALLERGIES: Allergies  Allergen Reactions  . Vimpat [Lacosamide] Other (See Comments)    Severe dizziness  . Benadryl [Diphenhydramine Hcl] Other (See Comments)    seizures  . Dilantin [Phenytoin Sodium Extended] Other (See Comments)    Elevated LFT's  . Melatonin Other (See Comments)    Unknown  . Ultram [Tramadol Hcl] Other (See Comments)    Seizures  . Vicodin [Hydrocodone-Acetaminophen] Itching  . Betadine [Povidone Iodine] Rash  . Codeine Itching and Rash    seizures  . Latex Rash  . Penicillins Itching and Rash  . Tape Rash  Paper tape please    HOME MEDICATIONS: Outpatient Prescriptions Prior to Visit  Medication Sig Dispense Refill  . benzonatate (TESSALON) 100 MG capsule Take 1 capsule (100 mg total) by mouth 2 (two) times daily as needed for cough.  20 capsule  0  . clonazePAM (KLONOPIN) 0.5 MG tablet Take 1 tablet (0.5 mg total) by mouth 3 (three) times daily as  needed for anxiety. Takes 0.5mg  & 1mg  at night by mouth.  90 tablet  0  . FLUoxetine (PROZAC) 20 MG capsule Take 60 mg by mouth every morning.      . gabapentin (NEURONTIN) 600 MG tablet Take 600-1,200 mg by mouth 3 (three) times daily. Takes 600mg  3x daily & 1200mg  at night      . ibuprofen (ADVIL,MOTRIN) 800 MG tablet Take 800 mg by mouth 3 (three) times daily as needed for headache.       . Multiple Vitamins-Minerals (ALIVE ONCE DAILY WOMENS 50+ PO) Take 1 tablet by mouth daily.      . pramipexole (MIRAPEX) 0.125 MG tablet Take 0.125 mg by mouth at bedtime.       . promethazine (PHENERGAN) 25 MG tablet Take 25 mg by mouth every 6 (six) hours as needed for nausea or vomiting.      . vitamin B-12 (CYANOCOBALAMIN) 1000 MCG tablet Take 1,000 mcg by mouth daily.      Marland Kitchen lamoTRIgine (LAMICTAL) 100 MG tablet Take 1 tablet (100 mg total) by mouth 2 (two) times daily.  60 tablet  0  . azithromycin (ZITHROMAX) 250 MG tablet Take as directed  6 tablet  0   No facility-administered medications prior to visit.    PHYSICAL EXAM  Filed Vitals:   02/02/13 1048  BP: 108/74  Pulse: 79  Height: 5' 0.5" (1.537 m)  Weight: 176 lb (79.833 kg)   Body mass index is 33.79 kg/(m^2).  Generalized: Well developed, in no acute distress, overweight Caucasian female  Head: normocephalic and atraumatic. Oropharynx benign  Neck: Supple, no carotid bruits  Cardiac: Regular rate rhythm, no murmur  Musculoskeletal: No deformity   Neurological examination  Mentation: Alert oriented to time, place, history taking. Follows all commands. Speech is fluent with a lisp-no dysarthria, dysphonia or aphasia. Mood and affect are nervous. Cranial nerve II-XII:   Pupils were equal round, minimally reactive to light, extraocular movements were full, visual field were full on confrontational test. Facial sensation and strength were normal. hearing was intact to finger rubbing bilaterally. Uvula tongue midline. head turning and  shoulder shrug and were normal and symmetric.Tongue protrusion into cheek strength was normal. Motor: Hip ROM restricted since birth - increased Tone in the left leg, and normal muscle bulk and symmetric normal strength in upper extremities.  Sensory: normal and symmetric to light touch, pinprick, and  vibration  Coordination: finger-nose-finger, heel-to-shin bilaterally, no dysmetria Reflexes:  Deep tendon reflexes in the upper and lower extremities are present and symmetric.  Gait and Station: Rising up from seated position without assistance, normal stance, without trunk ataxia. Tandem is unsteady, Romberg negative.  DIAGNOSTIC DATA (LABS, IMAGING, TESTING) - I reviewed patient records, labs, notes, testing and imaging myself where available.  Lab Results  Component Value Date   WBC 9.5 12/08/2012   HGB 12.9 12/08/2012   HCT 37.6 12/08/2012   MCV 91.5 12/08/2012   PLT 274 12/08/2012      Component Value Date/Time   NA 142 12/08/2012 1228   NA 141 11/08/2012 1533   K 3.9 12/08/2012  1228   CL 105 12/08/2012 1228   CO2 24 12/08/2012 1228   GLUCOSE 104* 12/08/2012 1228   GLUCOSE 84 11/08/2012 1533   BUN 12 12/08/2012 1228   BUN 6 11/08/2012 1533   CREATININE 0.77 12/08/2012 1228   CALCIUM 9.8 12/08/2012 1228   PROT 7.3 11/27/2012 1006   PROT 6.3 11/08/2012 1533   ALBUMIN 4.2 11/27/2012 1006   AST 36 11/27/2012 1006   ALT 68* 11/27/2012 1006   ALKPHOS 65 11/27/2012 1006   BILITOT 0.4 11/27/2012 1006   GFRNONAA >90 12/08/2012 1228   GFRAA >90 12/08/2012 1228    ASSESSMENT AND PLAN 48 y.o. year old female  has a past medical history of Vertigo; Constipation; Restless leg; High cholesterol; Chronic bronchitis; Headache(784.0); Depression; Epilepsy; Seizures; Arthritis; Osteoarthritis; Colon polyps; Rectal bleed; and Other convulsions (05/21/12). here for follow up of epilepsy and other convulsions.  PLAN: 1. Continue Lamotrigine (Lamictal) at current dose.  We will check your  lab level today. 2. Start Levetiracetam (Keppra) 500 mg, 1 tablet every 12 hours.  Side effects to watch for are worsening depression, suicidal feelings.  Please report these feelings to our office right away or go to the ER. 3. Continue regular follow up with your Psychiatrist.  She is advised that she needs to have her Klonopin refilled through Psychiatry.  Chart records say that Dr Janann Colonel already phoned in a Rx for this patient on 01/02. Refill request denied. 3. Follow up with Dr. Brett Fairy in 3 months.  Orders Placed This Encounter  Procedures  . Lamotrigine level   Meds ordered this encounter  Medications  . lamoTRIgine (LAMICTAL) 100 MG tablet    Sig: Take 1 tablet (100 mg total) by mouth 2 (two) times daily.    Dispense:  60 tablet    Refill:  5    Order Specific Question:  Supervising Provider    Answer:  DOHMEIER, CARMEN [id number]  . levETIRAcetam (KEPPRA) 500 MG tablet    Sig: Take 1 tablet (500 mg total) by mouth 2 (two) times daily.    Dispense:  60 tablet    Refill:  2    Order Specific Question:  Supervising Provider    Answer:  Brett Fairy, CARMEN [id number]   Return in about 3 months (around 05/03/2013).  Philmore Pali, MSN, NP-C 02/02/2013, 11:42 AM [facility] [address] [place], [place] [address] [phone]  Note: This document was prepared with digital dictation and possible smart phrase technology. Any transcriptional errors that result from this process are unintentional.

## 2013-02-04 ENCOUNTER — Other Ambulatory Visit: Payer: Self-pay | Admitting: *Deleted

## 2013-02-04 LAB — LAMOTRIGINE LEVEL: Lamotrigine Lvl: 6.9 ug/mL (ref 2.0–20.0)

## 2013-02-04 MED ORDER — CLONAZEPAM 0.5 MG PO TABS
0.5000 mg | ORAL_TABLET | Freq: Three times a day (TID) | ORAL | Status: DC | PRN
Start: 2013-02-04 — End: 2013-05-08

## 2013-02-07 ENCOUNTER — Telehealth: Payer: Self-pay | Admitting: Neurology

## 2013-02-07 NOTE — Telephone Encounter (Signed)
I called the pharmacy back.  Spoke with [place].  She transferred me to the pharmacist.  I verified the Rx.  They will fill it today and contact the patient when Rx is ready.

## 2013-02-07 NOTE — Telephone Encounter (Signed)
NEEDS DIRECTIONS FOR CLONAZEPAM

## 2013-02-08 NOTE — Progress Notes (Signed)
Quick Note:  Shared normal lamotrigine level with patient per Ms Lam's findings, she verbalized understanding ______

## 2013-02-14 DIAGNOSIS — F609 Personality disorder, unspecified: Secondary | ICD-10-CM | POA: Insufficient documentation

## 2013-02-22 ENCOUNTER — Ambulatory Visit (INDEPENDENT_AMBULATORY_CARE_PROVIDER_SITE_OTHER): Payer: Medicare Other

## 2013-02-22 ENCOUNTER — Ambulatory Visit (INDEPENDENT_AMBULATORY_CARE_PROVIDER_SITE_OTHER): Payer: Medicare Other | Admitting: General Practice

## 2013-02-22 ENCOUNTER — Encounter: Payer: Self-pay | Admitting: General Practice

## 2013-02-22 VITALS — BP 105/75 | HR 85 | Temp 97.0°F | Wt 172.5 lb

## 2013-02-22 DIAGNOSIS — M79609 Pain in unspecified limb: Secondary | ICD-10-CM

## 2013-02-22 DIAGNOSIS — M79641 Pain in right hand: Secondary | ICD-10-CM

## 2013-02-22 NOTE — Progress Notes (Signed)
   Subjective:    Patient ID: [name], female    DOB: 07/03/1965, 48 y.o.   MRN: [id number]  Hand Pain  The incident occurred 3 to 5 days ago. The incident occurred at home. The injury mechanism was a fall. The pain is present in the right hand. The quality of the pain is described as aching. The pain does not radiate. The pain is at a severity of 4/10. The pain has been intermittent since the incident. Pertinent negatives include no chest pain, muscle weakness, numbness or tingling. The symptoms are aggravated by movement. She has tried nothing for the symptoms.      Review of Systems  Constitutional: Negative for fever and chills.  Respiratory: Negative for chest tightness and shortness of breath.   Cardiovascular: Negative for chest pain and palpitations.  Musculoskeletal:       Right hand pain  Neurological: Negative for tingling and numbness.       Objective:   Physical Exam  Constitutional: She is oriented to person, place, and time. She appears well-developed and well-nourished.  Cardiovascular: Normal rate, regular rhythm and normal heart sounds.   Pulmonary/Chest: Effort normal and breath sounds normal. No respiratory distress. She exhibits no tenderness.  Musculoskeletal: She exhibits edema and tenderness.  Mild edema noted to right hand and tenderness with palpation. Full range of motion  Neurological: She is alert and oriented to person, place, and time.  Skin: Skin is warm and dry.  Psychiatric: She has a normal mood and affect.     [facility] reading (PRIMARY) by Erby Pian, FNP-C, no fracture or dislocation. Joint space narrowing noted.     Assessment & Plan:  1. Right hand pain - DG Hand Complete Right; Future -RICE instructions discussed and provided May take motrin as directed for discomfort RTO in 1 week for follow up Will refer to ortho if symptoms worsen or no improvement Patient verbalized understanding Erby Pian, FNP-C

## 2013-02-22 NOTE — Patient Instructions (Signed)
RICE: Routine Care for Injuries The routine care of many injuries includes Rest, Ice, Compression, and Elevation (RICE). HOME CARE INSTRUCTIONS  Rest is needed to allow your body to heal. Routine activities can usually be resumed when comfortable. Injured tendons and bones can take up to 6 weeks to heal. Tendons are the cord-like structures that attach muscle to bone.  Ice following an injury helps keep the swelling down and reduces pain.  Put ice in a plastic bag.  Place a towel between your skin and the bag.  Leave the ice on for 15-20 minutes, 03-04 times a day. Do this while awake, for the first 24 to 48 hours. After that, continue as directed by your caregiver.  Compression helps keep swelling down. It also gives support and helps with discomfort. If an elastic bandage has been applied, it should be removed and reapplied every 3 to 4 hours. It should not be applied tightly, but firmly enough to keep swelling down. Watch fingers or toes for swelling, bluish discoloration, coldness, numbness, or excessive pain. If any of these problems occur, remove the bandage and reapply loosely. Contact your caregiver if these problems continue.  Elevation helps reduce swelling and decreases pain. With extremities, such as the arms, hands, legs, and feet, the injured area should be placed near or above the level of the heart, if possible. SEEK IMMEDIATE MEDICAL CARE IF:  You have persistent pain and swelling.  You develop redness, numbness, or unexpected weakness.  Your symptoms are getting worse rather than improving after several days. These symptoms may indicate that further evaluation or further X-rays are needed. Sometimes, X-rays may not show a small broken bone (fracture) until 1 week or 10 days later. Make a follow-up appointment with your caregiver. Ask when your X-ray results will be ready. Make sure you get your X-ray results. Document Released: 04/13/2000 Document Revised: 03/24/2011  Document Reviewed: 05/31/2010 [facility] Patient Information 2014 [place], [place].

## 2013-02-23 ENCOUNTER — Other Ambulatory Visit: Payer: Self-pay | Admitting: Neurology

## 2013-03-23 ENCOUNTER — Other Ambulatory Visit: Payer: Self-pay | Admitting: Family Medicine

## 2013-03-24 NOTE — Telephone Encounter (Signed)
Do not see on current med list. Please advise 

## 2013-03-30 ENCOUNTER — Telehealth: Payer: Self-pay | Admitting: Neurology

## 2013-03-30 NOTE — Telephone Encounter (Signed)
Patient calling to request 10 more Klonopin pills sine she says she is about to run out. If questions, please call patient.

## 2013-03-30 NOTE — Telephone Encounter (Signed)
[facility] OV note says:  3. Continue regular follow up with your Psychiatrist. She is advised that she needs to have her Klonopin refilled through Psychiatry. I called the patient back at home.  She was not there.  I called alternate number.  Spoke with patient.  Advised we will not be filling this med, she needs to get it from other provider per notes.

## 2013-04-12 ENCOUNTER — Emergency Department (HOSPITAL_BASED_OUTPATIENT_CLINIC_OR_DEPARTMENT_OTHER): Payer: Medicare Other

## 2013-04-12 ENCOUNTER — Emergency Department (HOSPITAL_BASED_OUTPATIENT_CLINIC_OR_DEPARTMENT_OTHER)
Admission: EM | Admit: 2013-04-12 | Discharge: 2013-04-12 | Disposition: A | Discharge status: Home / Self Care | Payer: Medicare Other | Attending: Emergency Medicine | Admitting: Emergency Medicine

## 2013-04-12 ENCOUNTER — Encounter (HOSPITAL_BASED_OUTPATIENT_CLINIC_OR_DEPARTMENT_OTHER): Payer: Self-pay | Admitting: Emergency Medicine

## 2013-04-12 ENCOUNTER — Telehealth: Payer: Self-pay | Admitting: Family Medicine

## 2013-04-12 DIAGNOSIS — Z8739 Personal history of other diseases of the musculoskeletal system and connective tissue: Secondary | ICD-10-CM | POA: Insufficient documentation

## 2013-04-12 DIAGNOSIS — F329 Major depressive disorder, single episode, unspecified: Secondary | ICD-10-CM | POA: Insufficient documentation

## 2013-04-12 DIAGNOSIS — R071 Chest pain on breathing: Secondary | ICD-10-CM | POA: Insufficient documentation

## 2013-04-12 DIAGNOSIS — G40909 Epilepsy, unspecified, not intractable, without status epilepticus: Secondary | ICD-10-CM | POA: Insufficient documentation

## 2013-04-12 DIAGNOSIS — F3289 Other specified depressive episodes: Secondary | ICD-10-CM | POA: Insufficient documentation

## 2013-04-12 DIAGNOSIS — Z880 Allergy status to penicillin: Secondary | ICD-10-CM | POA: Insufficient documentation

## 2013-04-12 DIAGNOSIS — Z860100 Personal history of colon polyps, unspecified: Secondary | ICD-10-CM | POA: Insufficient documentation

## 2013-04-12 DIAGNOSIS — Z91040 Latex allergy status: Secondary | ICD-10-CM | POA: Insufficient documentation

## 2013-04-12 DIAGNOSIS — R42 Dizziness and giddiness: Secondary | ICD-10-CM | POA: Insufficient documentation

## 2013-04-12 DIAGNOSIS — Z79899 Other long term (current) drug therapy: Secondary | ICD-10-CM | POA: Insufficient documentation

## 2013-04-12 DIAGNOSIS — J189 Pneumonia, unspecified organism: Secondary | ICD-10-CM | POA: Insufficient documentation

## 2013-04-12 DIAGNOSIS — Z8709 Personal history of other diseases of the respiratory system: Secondary | ICD-10-CM | POA: Insufficient documentation

## 2013-04-12 DIAGNOSIS — Z8639 Personal history of other endocrine, nutritional and metabolic disease: Secondary | ICD-10-CM | POA: Insufficient documentation

## 2013-04-12 DIAGNOSIS — R0789 Other chest pain: Secondary | ICD-10-CM

## 2013-04-12 DIAGNOSIS — Z862 Personal history of diseases of the blood and blood-forming organs and certain disorders involving the immune mechanism: Secondary | ICD-10-CM | POA: Insufficient documentation

## 2013-04-12 MED ORDER — BENZONATATE 100 MG PO CAPS: 100.0000 mg | ORAL_CAPSULE | Freq: Three times a day (TID) | ORAL | Status: DC

## 2013-04-12 MED ORDER — AZITHROMYCIN 250 MG PO TABS: 250.0000 mg | ORAL_TABLET | Freq: Every day | ORAL | Status: DC

## 2013-04-12 MED ORDER — OXYCODONE-ACETAMINOPHEN 5-325 MG PO TABS
2.0000 | ORAL_TABLET | Freq: Once | ORAL | Status: AC
  Administered 2013-04-12: 2 via ORAL
  Filled 2013-04-12: qty 2

## 2013-04-12 MED ORDER — IBUPROFEN 800 MG PO TABS
800.0000 mg | ORAL_TABLET | Freq: Once | ORAL | Status: AC
  Administered 2013-04-12: 800 mg via ORAL
  Filled 2013-04-12: qty 1

## 2013-04-12 MED ORDER — OXYCODONE-ACETAMINOPHEN 5-325 MG PO TABS: 1.0000 | ORAL_TABLET | ORAL | Status: DC | PRN

## 2013-04-12 NOTE — ED Notes (Signed)
Cough and night sweats x 2 days. Pain in her right ribs and dizziness. Ambulatory to treatment room.

## 2013-04-12 NOTE — ED Provider Notes (Signed)
TIME SEEN: 4:29 PM  CHIEF COMPLAINT: Fever, cough  HPI: Patient is a 48 year old female with a history of a epilepsy on Keppra and Lamictal, hyperlipidemia who presents to the emergency department with 2 days of dry cough, fever, chills and night sweats. She put complains of having sharp pain in her right ribs is worse with coughing and deep inspiration. She is not having this pain currently. No other chest pain. No shortness of breath. No vomiting or diarrhea. She has had intermittent lightheadedness but none currently. No sick contacts or recent travel. No recent hospitalization. No history of PE or DVT, prolonged immobilization, exogenous hormone use, tobacco use. No history of CAD. Patient denies IV drug use, recent incarceration, being immunocompromised. No history of asthma, COPD or oxygen use at home.   ROS: See HPI Constitutional:  fever  Eyes: no drainage  ENT: no runny nose   Cardiovascular: Right lateral chest pain  Resp: no SOB  GI: no vomiting GU: no dysuria Integumentary: no rash  Allergy: no hives  Musculoskeletal: no leg swelling  Neurological: no slurred speech ROS otherwise negative  PAST MEDICAL HISTORY/PAST SURGICAL HISTORY:  Past Medical History  Diagnosis Date  . Vertigo   . Constipation   . Restless leg   . High cholesterol   . Chronic bronchitis     "yearly; when the weather changes" (04/22/2012)  . ZOXWRUEA(540.9)     "1/wk" (04/22/2012)  . Depression   . Epilepsy     "been having them right often here lately" (04/22/2012)  . Seizures   . Arthritis     "knees" (04/22/2012)  . Osteoarthritis     [name] 04/22/2012  . Colon polyps     adenomatous and hyperplastic-  . Rectal bleed     in toilet- bright red  . Other convulsions 05/21/12    non-epileptic spells    MEDICATIONS:  Prior to Admission medications   Medication Sig Start Date End Date Taking? Authorizing Provider  clonazePAM (KLONOPIN) 0.5 MG tablet Take 1 tablet (0.5 mg total) by mouth 3  (three) times daily as needed for anxiety. Takes 0.5mg  & 1mg  at night by mouth. 02/04/13   [name], MD  FLUoxetine (PROZAC) 20 MG capsule Take 60 mg by mouth every morning.    Historical Provider, MD  gabapentin (NEURONTIN) 600 MG tablet Take 600-1,200 mg by mouth 3 (three) times daily. Takes 600mg  3x daily & 1200mg  at night 08/25/12   Asencion Partridge Dohmeier, MD  gabapentin (NEURONTIN) 600 MG tablet TAKE ONE TABLET BY MOUTH THREE TIMES DAILY AND TWO AT BEDTIME 02/23/13   [name], MD  ibuprofen (ADVIL,MOTRIN) 800 MG tablet Take 800 mg by mouth 3 (three) times daily as needed for headache.  10/04/12   Historical Provider, MD  lamoTRIgine (LAMICTAL) 100 MG tablet Take 1 tablet (100 mg total) by mouth 2 (two) times daily. 02/02/13   Philmore Pali, NP  levETIRAcetam (KEPPRA) 500 MG tablet Take 1 tablet (500 mg total) by mouth 2 (two) times daily. 02/02/13   Philmore Pali, NP  Multiple Vitamins-Minerals (ALIVE ONCE DAILY WOMENS 50+ PO) Take 1 tablet by mouth daily.    Historical Provider, MD  pramipexole (MIRAPEX) 0.125 MG tablet Take 0.125 mg by mouth at bedtime.  07/10/12   Historical Provider, MD  promethazine (PHENERGAN) 25 MG tablet Take 25 mg by mouth every 6 (six) hours as needed for nausea or vomiting.    Historical Provider, MD  vitamin B-12 (CYANOCOBALAMIN) 1000 MCG tablet Take 1,000 mcg  by mouth daily.    Historical Provider, MD    ALLERGIES:  Allergies  Allergen Reactions  . Vimpat [Lacosamide] Other (See Comments)    Severe dizziness  . Benadryl [Diphenhydramine Hcl] Other (See Comments)    seizures  . Dilantin [Phenytoin Sodium Extended] Other (See Comments)    Elevated LFT's  . Melatonin Other (See Comments)    Unknown  . Ultram [Tramadol Hcl] Other (See Comments)    Seizures  . Vicodin [Hydrocodone-Acetaminophen] Itching  . Betadine [Povidone Iodine] Rash  . Codeine Itching and Rash    seizures  . Latex Rash  . Penicillins Itching and Rash  . Tape Rash    Paper tape please     SOCIAL HISTORY:  History  Substance Use Topics  . Smoking status: Never Smoker   . Smokeless tobacco: Never Used  . Alcohol Use: No    FAMILY HISTORY: Family History  Problem Relation Age of Onset  . Cancer Mother   . Cancer Brother   . Diabetes Brother   . Cancer Maternal Aunt   . Diabetes Maternal Aunt     EXAM: BP 118/79  Pulse 92  Temp(Src) 100 F (37.8 C) (Oral)  Resp 20  Ht 5' (1.524 m)  Wt 172 lb (78.019 kg)  BMI 33.59 kg/m2  SpO2 97% CONSTITUTIONAL: Alert and oriented and responds appropriately to questions. Well-appearing; well-nourished HEAD: Normocephalic EYES: Conjunctivae clear, PERRL ENT: normal nose; no rhinorrhea; moist mucous membranes; pharynx without lesions noted NECK: Supple, no meningismus, no LAD  CARD: RRR; S1 and S2 appreciated; no murmurs, no clicks, no rubs, no gallops RESP: Normal chest excursion without splinting or tachypnea; breath sounds clear and equal bilaterally; no wheezes, no rhonchi, no rales, patient is tender to palpation over her right lateral chest wall with no crepitus or ecchymosis or deformity ABD/GI: Normal bowel sounds; non-distended; soft, non-tender, no rebound, no guarding BACK:  The back appears normal and is non-tender to palpation, there is no CVA tenderness EXT: Normal ROM in all joints; non-tender to palpation; no edema; normal capillary refill; no cyanosis    SKIN: Normal color for age and race; warm NEURO: Moves all extremities equally PSYCH: The patient's mood and manner are appropriate. Grooming and personal hygiene are appropriate.  MEDICAL DECISION MAKING: Patient here with possible viral illness versus pneumonia. She has no risk factors for tuberculosis or pulmonary embolus. Will check EKG and chest x-ray. We'll give pain medication and reassess. She is very well-appearing with no respiratory distress, hypoxia, increased work of breathing.  ED PROGRESS: Patient's chest x-ray shows a right upper lobe  pneumonia. We'll discharge with prescription for azithromycin. Have given strict return precautions and supportive care instructions. Patient and her husband verbalize understanding and are comfortable plan.     EKG Interpretation  Date/Time:  Tuesday April 12 2013 16:53:37 EDT Ventricular Rate:  89 PR Interval:  122 QRS Duration: 92 QT Interval:  358 QTC Calculation: 435 R Axis:   64 Text Interpretation:  Normal sinus rhythm Normal ECG Confirmed by Gerturde Kuba,  DO, Julieann Drummonds ([id number]) on 04/12/2013 4:58:20 PM        [place], DO 04/12/13 [id number]

## 2013-04-12 NOTE — Telephone Encounter (Signed)
Patient aware we have no more available appts today and to try urgent care or ER for evaluataion

## 2013-04-12 NOTE — Discharge Instructions (Signed)
Chest Wall Pain Chest wall pain is pain in or around the bones and muscles of your chest. It may take up to 6 weeks to get better. It may take longer if you must stay physically active in your work and activities.  CAUSES  Chest wall pain may happen on its own. However, it may be caused by:  A viral illness like the flu.  Injury.  Coughing.  Exercise.  Arthritis.  Fibromyalgia.  Shingles. HOME CARE INSTRUCTIONS   Avoid overtiring physical activity. Try not to strain or perform activities that cause pain. This includes any activities using your chest or your abdominal and side muscles, especially if heavy weights are used.  Put ice on the sore area.  Put ice in a plastic bag.  Place a towel between your skin and the bag.  Leave the ice on for 15-20 minutes per hour while awake for the first 2 days.  Only take over-the-counter or prescription medicines for pain, discomfort, or fever as directed by your caregiver. SEEK IMMEDIATE MEDICAL CARE IF:   Your pain increases, or you are very uncomfortable.  You have a fever.  Your chest pain becomes worse.  You have new, unexplained symptoms.  You have nausea or vomiting.  You feel sweaty or lightheaded.  You have a cough with phlegm (sputum), or you cough up blood. MAKE SURE YOU:   Understand these instructions.  Will watch your condition.  Will get help right away if you are not doing well or get worse. Document Released: 12/30/2004 Document Revised: 03/24/2011 Document Reviewed: 08/26/2010 [facility] Patient Information 2014 [place], [place].  Pneumonia, Adult Pneumonia is an infection of the lungs.  CAUSES Pneumonia may be caused by bacteria or a virus. Usually, these infections are caused by breathing infectious particles into the lungs (respiratory tract). SYMPTOMS   Cough.  Fever.  Chest pain.  Increased rate of breathing.  Wheezing.  Mucus production. DIAGNOSIS  If you have the common symptoms of  pneumonia, your caregiver will typically confirm the diagnosis with a chest X-ray. The X-ray will show an abnormality in the lung (pulmonary infiltrate) if you have pneumonia. Other tests of your blood, urine, or sputum may be done to find the specific cause of your pneumonia. Your caregiver may also do tests (blood gases or pulse oximetry) to see how well your lungs are working. TREATMENT  Some forms of pneumonia may be spread to other people when you cough or sneeze. You may be asked to wear a mask before and during your exam. Pneumonia that is caused by bacteria is treated with antibiotic medicine. Pneumonia that is caused by the influenza virus may be treated with an antiviral medicine. Most other viral infections must run their course. These infections will not respond to antibiotics.  PREVENTION A pneumococcal shot (vaccine) is available to prevent a common bacterial cause of pneumonia. This is usually suggested for:  People over 31 years old.  Patients on chemotherapy.  People with chronic lung problems, such as bronchitis or emphysema.  People with immune system problems. If you are over 65 or have a high risk condition, you may receive the pneumococcal vaccine if you have not received it before. In some countries, a routine influenza vaccine is also recommended. This vaccine can help prevent some cases of pneumonia.You may be offered the influenza vaccine as part of your care. If you smoke, it is time to quit. You may receive instructions on how to stop smoking. Your caregiver can provide medicines and  to help you quit. °HOME CARE INSTRUCTIONS  °· Cough suppressants may be used if you are losing too much rest. However, coughing protects you by clearing your lungs. You should avoid using cough suppressants if you can. °· Your caregiver may have prescribed medicine if he or she thinks your pneumonia is caused by a bacteria or influenza. Finish your medicine even if you start to feel  better. °· Your caregiver may also prescribe an expectorant. This loosens the mucus to be coughed up. °· Only take over-the-counter or prescription medicines for pain, discomfort, or fever as directed by your caregiver. °· Do not smoke. Smoking is a common cause of bronchitis and can contribute to pneumonia. If you are a smoker and continue to smoke, your cough may last several weeks after your pneumonia has cleared. °· A cold steam vaporizer or humidifier in your room or home may help loosen mucus. °· Coughing is often worse at night. Sleeping in a semi-upright position in a recliner or using a couple pillows under your head will help with this. °· Get rest as you feel it is needed. Your body will usually let you know when you need to rest. °SEEK IMMEDIATE MEDICAL CARE IF:  °· Your illness becomes worse. This is especially true if you are elderly or weakened from any other disease. °· You cannot control your cough with suppressants and are losing sleep. °· You begin coughing up blood. °· You develop pain which is getting worse or is uncontrolled with medicines. °· You have a fever. °· Any of the symptoms which initially brought you in for treatment are getting worse rather than better. °· You develop shortness of breath or chest pain. °MAKE SURE YOU:  °· Understand these instructions. °· Will watch your condition. °· Will get help right away if you are not doing well or get worse. °Document Released: 12/30/2004 Document Revised: 03/24/2011 Document Reviewed: 03/21/2010 °[employer]® Patient Information ©2014 [employer]. ° °

## 2013-04-14 ENCOUNTER — Telehealth: Payer: Self-pay | Admitting: Family Medicine

## 2013-04-14 NOTE — Telephone Encounter (Signed)
appt given with Rush [facility] on Wed

## 2013-04-20 ENCOUNTER — Ambulatory Visit: Payer: Medicare Other | Admitting: Family Medicine

## 2013-04-21 ENCOUNTER — Ambulatory Visit (INDEPENDENT_AMBULATORY_CARE_PROVIDER_SITE_OTHER): Payer: Medicare Other | Admitting: Family Medicine

## 2013-04-21 ENCOUNTER — Encounter: Payer: Self-pay | Admitting: Family Medicine

## 2013-04-21 VITALS — BP 101/73 | HR 79 | Temp 97.8°F | Ht 60.5 in | Wt 164.6 lb

## 2013-04-21 DIAGNOSIS — R059 Cough, unspecified: Secondary | ICD-10-CM

## 2013-04-21 DIAGNOSIS — E785 Hyperlipidemia, unspecified: Secondary | ICD-10-CM

## 2013-04-21 DIAGNOSIS — R05 Cough: Secondary | ICD-10-CM

## 2013-04-21 MED ORDER — BENZONATATE 100 MG PO CAPS: 100.0000 mg | ORAL_CAPSULE | Freq: Three times a day (TID) | ORAL | Status: DC | PRN

## 2013-04-21 MED ORDER — OMEGA-3-ACID ETHYL ESTERS 1 G PO CAPS: 2.0000 g | ORAL_CAPSULE | Freq: Two times a day (BID) | ORAL | Status: DC

## 2013-04-21 NOTE — Progress Notes (Signed)
   Subjective:    Patient ID: [name], female    DOB: 1965/08/05, 48 y.o.   MRN: [id number]  HPI  This 48 y.o. female presents for evaluation of hospital follow up.  She has been dx with pneumonia And has been tx with zpak, tessalon perles, and percocet.  She denies fever and is feeling good.  Review of Systems    No chest pain, SOB, HA, dizziness, vision change, N/V, diarrhea, constipation, dysuria, urinary urgency or frequency, myalgias, arthralgias or rash.  Objective:   Physical Exam  Vital signs noted  Well developed well nourished female.  HEENT - Head atraumatic Normocephalic                Eyes - PERRLA, Conjuctiva - clear Sclera- Clear EOMI                Ears - EAC's Wnl TM's Wnl Gross Hearing WNL                Nose - Nares patent                 Throat - oropharanx wnl Respiratory - Lungs CTA bilateral Cardiac - RRR S1 and S2 without murmur GI - Abdomen soft Nontender and bowel sounds active x 4 Extremities - No edema. Neuro - Grossly intact.      Assessment & Plan:  Cough - Plan: benzonatate (TESSALON PERLES) 100 MG capsule  Hospital follow up - Pneumonia is resolved and recommend follow up if sx's return.  Other and unspecified hyperlipidemia - Plan: omega-3 acid ethyl esters (LOVAZA) 1 G capsule  Lysbeth Penner FNP

## 2013-04-25 ENCOUNTER — Other Ambulatory Visit: Payer: Self-pay

## 2013-04-25 MED ORDER — LEVETIRACETAM 500 MG PO TABS: 500.0000 mg | ORAL_TABLET | Freq: Two times a day (BID) | ORAL | Status: DC

## 2013-05-05 ENCOUNTER — Ambulatory Visit (INDEPENDENT_AMBULATORY_CARE_PROVIDER_SITE_OTHER): Payer: Medicare Other | Admitting: Nurse Practitioner

## 2013-05-05 VITALS — BP 109/80 | HR 91 | Temp 98.7°F | Ht 60.5 in | Wt 166.0 lb

## 2013-05-05 DIAGNOSIS — R05 Cough: Secondary | ICD-10-CM

## 2013-05-05 DIAGNOSIS — R059 Cough, unspecified: Secondary | ICD-10-CM

## 2013-05-05 DIAGNOSIS — J019 Acute sinusitis, unspecified: Secondary | ICD-10-CM

## 2013-05-05 MED ORDER — AZITHROMYCIN 250 MG PO TABS: ORAL_TABLET | ORAL | Status: DC

## 2013-05-05 NOTE — Patient Instructions (Signed)

## 2013-05-05 NOTE — Progress Notes (Signed)
   Subjective:    Patient ID: [name], female    DOB: May 25, 1965, 48 y.o.   MRN: [id number]  HPI Patient in c/o cough that started Monday of this week- No fever- Husband in hospital with virus.    Review of Systems  Constitutional: Positive for fever (100 oral at night). Negative for chills and appetite change.  HENT: Positive for congestion, postnasal drip, sinus pressure and sore throat. Negative for ear discharge, sneezing, trouble swallowing and voice change.   Respiratory: Positive for cough (productive- clear).   Cardiovascular: Negative.   Gastrointestinal: Negative.   Musculoskeletal: Negative.   Neurological: Negative.   Psychiatric/Behavioral: Negative.        Objective:   Physical Exam  Constitutional: She is oriented to person, place, and time. She appears well-developed and well-nourished. No distress.  HENT:  Right Ear: Hearing, tympanic membrane, external ear and ear canal normal.  Left Ear: Hearing, tympanic membrane, external ear and ear canal normal.  Nose: Mucosal edema and rhinorrhea present. Right sinus exhibits maxillary sinus tenderness. Right sinus exhibits no frontal sinus tenderness. Left sinus exhibits maxillary sinus tenderness. Left sinus exhibits no frontal sinus tenderness.  Mouth/Throat: Uvula is midline, oropharynx is clear and moist and mucous membranes are normal.  Eyes: Pupils are equal, round, and reactive to light.  Neck: Normal range of motion. Neck supple.  Cardiovascular: Normal rate and normal heart sounds.   Pulmonary/Chest: Effort normal and breath sounds normal. No respiratory distress. She has no wheezes. She has no rales. She exhibits no tenderness.  Deep wet cough  Lymphadenopathy:    She has no cervical adenopathy.  Neurological: She is alert and oriented to person, place, and time.  Skin: Skin is warm and dry.  Psychiatric: She has a normal mood and affect. Her behavior is normal. Judgment and thought content normal.   BP  109/80  Pulse 91  Temp(Src) 98.7 F (37.1 C) (Oral)  Ht 5' 0.5" (1.537 m)  Wt 166 lb (75.297 kg)  BMI 31.87 kg/m2        Assessment & Plan:   1. Acute rhinosinusitis   2. Cough    Meds ordered this encounter  Medications  . azithromycin (ZITHROMAX Z-PAK) 250 MG tablet    Sig: As directed    Dispense:  6 each    Refill:  0    Order Specific Question:  Supervising Provider    Answer:  Chipper Herb [[id number]]   1. Take meds as prescribed 2. Use a cool mist humidifier especially during the winter months and when heat has been humid. 3. Use saline nose sprays frequently 4. Saline irrigations of the nose can be very helpful if done frequently.  * 4X daily for 1 week*  * Use of a nettie pot can be helpful with this. Follow directions with this* 5. Drink plenty of fluids 6. Keep thermostat turn down low 7.For any cough or congestion  Use plain Mucinex- regular strength or max strength is fine   * Children- consult with Pharmacist for dosing 8. For fever or aces or pains- take tylenol or ibuprofen appropriate for age and weight.  * for fevers greater than 101 orally you may alternate ibuprofen and tylenol every  3 hours.   [name], FNP

## 2013-05-08 ENCOUNTER — Emergency Department (HOSPITAL_COMMUNITY): Payer: Medicare Other

## 2013-05-08 ENCOUNTER — Encounter (HOSPITAL_COMMUNITY): Payer: Self-pay | Admitting: Emergency Medicine

## 2013-05-08 ENCOUNTER — Inpatient Hospital Stay (HOSPITAL_COMMUNITY): Payer: Medicare Other

## 2013-05-08 ENCOUNTER — Inpatient Hospital Stay (HOSPITAL_COMMUNITY)
Admission: EM | Admit: 2013-05-08 | Discharge: 2013-05-09 | DRG: 195 | Disposition: A | Discharge status: Home / Self Care | Payer: Medicare Other | Attending: Internal Medicine | Admitting: Internal Medicine

## 2013-05-08 DIAGNOSIS — F321 Major depressive disorder, single episode, moderate: Secondary | ICD-10-CM

## 2013-05-08 DIAGNOSIS — M1990 Unspecified osteoarthritis, unspecified site: Secondary | ICD-10-CM | POA: Diagnosis present

## 2013-05-08 DIAGNOSIS — Z885 Allergy status to narcotic agent status: Secondary | ICD-10-CM

## 2013-05-08 DIAGNOSIS — H60399 Other infective otitis externa, unspecified ear: Secondary | ICD-10-CM

## 2013-05-08 DIAGNOSIS — H6090 Unspecified otitis externa, unspecified ear: Secondary | ICD-10-CM | POA: Diagnosis present

## 2013-05-08 DIAGNOSIS — Z880 Allergy status to penicillin: Secondary | ICD-10-CM

## 2013-05-08 DIAGNOSIS — E7800 Pure hypercholesterolemia, unspecified: Secondary | ICD-10-CM | POA: Diagnosis present

## 2013-05-08 DIAGNOSIS — J189 Pneumonia, unspecified organism: Principal | ICD-10-CM

## 2013-05-08 DIAGNOSIS — Z833 Family history of diabetes mellitus: Secondary | ICD-10-CM

## 2013-05-08 DIAGNOSIS — Z96649 Presence of unspecified artificial hip joint: Secondary | ICD-10-CM

## 2013-05-08 DIAGNOSIS — G40309 Generalized idiopathic epilepsy and epileptic syndromes, not intractable, without status epilepticus: Secondary | ICD-10-CM

## 2013-05-08 DIAGNOSIS — G40909 Epilepsy, unspecified, not intractable, without status epilepticus: Secondary | ICD-10-CM

## 2013-05-08 DIAGNOSIS — T50911A Poisoning by multiple unspecified drugs, medicaments and biological substances, accidental (unintentional), initial encounter: Secondary | ICD-10-CM

## 2013-05-08 DIAGNOSIS — Z888 Allergy status to other drugs, medicaments and biological substances status: Secondary | ICD-10-CM

## 2013-05-08 DIAGNOSIS — H6690 Otitis media, unspecified, unspecified ear: Secondary | ICD-10-CM

## 2013-05-08 DIAGNOSIS — R569 Unspecified convulsions: Secondary | ICD-10-CM

## 2013-05-08 DIAGNOSIS — D649 Anemia, unspecified: Secondary | ICD-10-CM | POA: Diagnosis present

## 2013-05-08 DIAGNOSIS — G2581 Restless legs syndrome: Secondary | ICD-10-CM | POA: Diagnosis present

## 2013-05-08 DIAGNOSIS — E876 Hypokalemia: Secondary | ICD-10-CM | POA: Diagnosis present

## 2013-05-08 DIAGNOSIS — G40409 Other generalized epilepsy and epileptic syndromes, not intractable, without status epilepticus: Secondary | ICD-10-CM

## 2013-05-08 LAB — COMPREHENSIVE METABOLIC PANEL
ALT: 29 U/L (ref 0–35)
AST: 21 U/L (ref 0–37)
Albumin: 3.5 g/dL (ref 3.5–5.2)
Alkaline Phosphatase: 69 U/L (ref 39–117)
BUN: 6 mg/dL (ref 6–23)
CO2: 24 mEq/L (ref 19–32)
Calcium: 8.6 mg/dL (ref 8.4–10.5)
Chloride: 99 mEq/L (ref 96–112)
Creatinine, Ser: 0.7 mg/dL (ref 0.50–1.10)
GFR calc Af Amer: >90 mL/min (ref >90)
GFR calc non Af Amer: >90 mL/min (ref >90)
Glucose, Bld: 117 mg/dL — ABNORMAL HIGH (ref 70–99)
Potassium: 3.4 mEq/L — ABNORMAL LOW (ref 3.7–5.3)
Sodium: 136 mEq/L — ABNORMAL LOW (ref 137–147)
Total Bilirubin: 0.4 mg/dL (ref 0.3–1.2)
Total Protein: 6.9 g/dL (ref 6.0–8.3)

## 2013-05-08 LAB — BASIC METABOLIC PANEL
BUN: 6 mg/dL (ref 6–23)
CO2: 21 mEq/L (ref 19–32)
Calcium: 9.6 mg/dL (ref 8.4–10.5)
Chloride: 95 mEq/L — ABNORMAL LOW (ref 96–112)
Creatinine, Ser: 0.65 mg/dL (ref 0.50–1.10)
GFR calc Af Amer: >90 mL/min (ref >90)
GFR calc non Af Amer: >90 mL/min (ref >90)
Glucose, Bld: 89 mg/dL (ref 70–99)
Potassium: 3.7 mEq/L (ref 3.7–5.3)
Sodium: 133 mEq/L — ABNORMAL LOW (ref 137–147)

## 2013-05-08 LAB — CBC WITH DIFFERENTIAL/PLATELET
Basophils Absolute: 0 10*3/uL (ref 0.0–0.1)
Basophils Relative: 0 % (ref 0–1)
Eosinophils Absolute: 0 10*3/uL (ref 0.0–0.7)
Eosinophils Relative: 0 % (ref 0–5)
HCT: 35.2 % — ABNORMAL LOW (ref 36.0–46.0)
Hemoglobin: 12 g/dL (ref 12.0–15.0)
Lymphocytes Relative: 34 % (ref 12–46)
Lymphs Abs: 2.7 10*3/uL (ref 0.7–4.0)
MCH: 30.3 pg (ref 26.0–34.0)
MCHC: 34.1 g/dL (ref 30.0–36.0)
MCV: 88.9 fL (ref 78.0–100.0)
Monocytes Absolute: 0.6 10*3/uL (ref 0.1–1.0)
Monocytes Relative: 8 % (ref 3–12)
Neutro Abs: 4.7 10*3/uL (ref 1.7–7.7)
Neutrophils Relative %: 58 % (ref 43–77)
Platelets: 249 10*3/uL (ref 150–400)
RBC: 3.96 MIL/uL (ref 3.87–5.11)
RDW: 13.5 % (ref 11.5–15.5)
WBC: 8 10*3/uL (ref 4.0–10.5)

## 2013-05-08 LAB — PROTIME-INR
INR: 1.09 (ref 0.00–1.49)
Prothrombin Time: 13.9 seconds (ref 11.6–15.2)

## 2013-05-08 LAB — CBC
HCT: 36.4 % (ref 36.0–46.0)
Hemoglobin: 12.6 g/dL (ref 12.0–15.0)
MCH: 30.4 pg (ref 26.0–34.0)
MCHC: 34.6 g/dL (ref 30.0–36.0)
MCV: 87.7 fL (ref 78.0–100.0)
Platelets: 269 10*3/uL (ref 150–400)
RBC: 4.15 MIL/uL (ref 3.87–5.11)
RDW: 13.2 % (ref 11.5–15.5)
WBC: 9.2 10*3/uL (ref 4.0–10.5)

## 2013-05-08 LAB — MAGNESIUM: Magnesium: 1.9 mg/dL (ref 1.5–2.5)

## 2013-05-08 LAB — I-STAT CG4 LACTIC ACID, ED
Lactic Acid, Venous: 1.98 mmol/L (ref 0.5–2.2)
Lactic Acid, Venous: 2.15 mmol/L (ref 0.5–2.2)

## 2013-05-08 LAB — TSH: TSH: 1.65 u[IU]/mL (ref 0.350–4.500)

## 2013-05-08 LAB — RAPID STREP SCREEN (MED CTR MEBANE ONLY): Streptococcus, Group A Screen (Direct): NEGATIVE

## 2013-05-08 LAB — APTT: aPTT: 36 seconds (ref 24–37)

## 2013-05-08 LAB — PHOSPHORUS: Phosphorus: 3 mg/dL (ref 2.3–4.6)

## 2013-05-08 MED ORDER — GABAPENTIN 400 MG PO CAPS
1200.0000 mg | ORAL_CAPSULE | Freq: Every day | ORAL | Status: DC
  Administered 2013-05-08: 1200 mg via ORAL
  Filled 2013-05-08 (×2): qty 3

## 2013-05-08 MED ORDER — GABAPENTIN 600 MG PO TABS
600.0000 mg | ORAL_TABLET | ORAL | Status: DC
  Administered 2013-05-09 (×2): 600 mg via ORAL
  Filled 2013-05-08 (×4): qty 1

## 2013-05-08 MED ORDER — VITAMIN B-12 1000 MCG PO TABS
1000.0000 ug | ORAL_TABLET | Freq: Every day | ORAL | Status: DC
  Administered 2013-05-08 – 2013-05-09 (×2): 1000 ug via ORAL
  Filled 2013-05-08 (×2): qty 1

## 2013-05-08 MED ORDER — ACETAMINOPHEN 325 MG PO TABS: 650.0000 mg | ORAL_TABLET | Freq: Four times a day (QID) | ORAL | Status: DC | PRN

## 2013-05-08 MED ORDER — GABAPENTIN 600 MG PO TABS: 600.0000 mg | ORAL_TABLET | Freq: Four times a day (QID) | ORAL | Status: DC

## 2013-05-08 MED ORDER — IOHEXOL 300 MG/ML  SOLN
80.0000 mL | Freq: Once | INTRAMUSCULAR | Status: AC | PRN
  Administered 2013-05-08: 80 mL via INTRAVENOUS

## 2013-05-08 MED ORDER — VANCOMYCIN HCL IN DEXTROSE 1-5 GM/200ML-% IV SOLN
1000.0000 mg | Freq: Two times a day (BID) | INTRAVENOUS | Status: DC
  Administered 2013-05-09 (×2): 1000 mg via INTRAVENOUS
  Filled 2013-05-08 (×4): qty 200

## 2013-05-08 MED ORDER — SODIUM CHLORIDE 0.9 % IJ SOLN
3.0000 mL | Freq: Two times a day (BID) | INTRAMUSCULAR | Status: DC
  Administered 2013-05-08: 3 mL via INTRAVENOUS

## 2013-05-08 MED ORDER — DOXYCYCLINE HYCLATE 100 MG PO TABS
100.0000 mg | ORAL_TABLET | Freq: Once | ORAL | Status: AC
  Administered 2013-05-08: 100 mg via ORAL
  Filled 2013-05-08: qty 1

## 2013-05-08 MED ORDER — LEVOFLOXACIN IN D5W 750 MG/150ML IV SOLN: 750.0000 mg | INTRAVENOUS | Status: DC

## 2013-05-08 MED ORDER — ALBUTEROL SULFATE (2.5 MG/3ML) 0.083% IN NEBU: 2.5000 mg | INHALATION_SOLUTION | RESPIRATORY_TRACT | Status: DC | PRN

## 2013-05-08 MED ORDER — ONDANSETRON HCL 4 MG/2ML IJ SOLN
4.0000 mg | Freq: Once | INTRAMUSCULAR | Status: AC
  Administered 2013-05-08: 4 mg via INTRAVENOUS
  Filled 2013-05-08: qty 2

## 2013-05-08 MED ORDER — HYDROMORPHONE HCL PF 1 MG/ML IJ SOLN
0.5000 mg | INTRAMUSCULAR | Status: DC | PRN
  Administered 2013-05-08 – 2013-05-09 (×3): 0.5 mg via INTRAVENOUS
  Filled 2013-05-08 (×3): qty 1

## 2013-05-08 MED ORDER — ACETAMINOPHEN 650 MG RE SUPP: 650.0000 mg | Freq: Four times a day (QID) | RECTAL | Status: DC | PRN

## 2013-05-08 MED ORDER — CLONAZEPAM 0.5 MG PO TABS
0.5000 mg | ORAL_TABLET | Freq: Three times a day (TID) | ORAL | Status: DC
  Administered 2013-05-08 – 2013-05-09 (×2): 0.5 mg via ORAL
  Filled 2013-05-08 (×2): qty 1

## 2013-05-08 MED ORDER — ONDANSETRON HCL 4 MG/2ML IJ SOLN: 4.0000 mg | Freq: Four times a day (QID) | INTRAMUSCULAR | Status: DC | PRN

## 2013-05-08 MED ORDER — FLUOXETINE HCL 20 MG PO CAPS
40.0000 mg | ORAL_CAPSULE | Freq: Every day | ORAL | Status: DC
  Administered 2013-05-09: 40 mg via ORAL
  Filled 2013-05-08: qty 2

## 2013-05-08 MED ORDER — BENZONATATE 100 MG PO CAPS
100.0000 mg | ORAL_CAPSULE | Freq: Three times a day (TID) | ORAL | Status: DC | PRN
  Filled 2013-05-08: qty 1

## 2013-05-08 MED ORDER — SODIUM CHLORIDE 0.9 % IV BOLUS (SEPSIS)
1000.0000 mL | Freq: Once | INTRAVENOUS | Status: AC
  Administered 2013-05-08: 1000 mL via INTRAVENOUS

## 2013-05-08 MED ORDER — SODIUM CHLORIDE 0.9 % IV SOLN
INTRAVENOUS | Status: DC
  Administered 2013-05-09: 20 mL/h via INTRAVENOUS

## 2013-05-08 MED ORDER — ONDANSETRON HCL 4 MG PO TABS: 4.0000 mg | ORAL_TABLET | Freq: Four times a day (QID) | ORAL | Status: DC | PRN

## 2013-05-08 MED ORDER — LAMOTRIGINE 100 MG PO TABS
100.0000 mg | ORAL_TABLET | Freq: Two times a day (BID) | ORAL | Status: DC
  Administered 2013-05-08 – 2013-05-09 (×2): 100 mg via ORAL
  Filled 2013-05-08 (×3): qty 1

## 2013-05-08 MED ORDER — ACETAMINOPHEN 325 MG PO TABS
650.0000 mg | ORAL_TABLET | Freq: Four times a day (QID) | ORAL | Status: DC | PRN
  Administered 2013-05-08: 650 mg via ORAL
  Filled 2013-05-08: qty 2

## 2013-05-08 MED ORDER — OMEGA-3-ACID ETHYL ESTERS 1 G PO CAPS
2.0000 g | ORAL_CAPSULE | Freq: Two times a day (BID) | ORAL | Status: DC
  Administered 2013-05-08 – 2013-05-09 (×2): 2 g via ORAL
  Filled 2013-05-08 (×3): qty 2

## 2013-05-08 MED ORDER — DEXTROSE 5 % IV SOLN
1.0000 g | Freq: Once | INTRAVENOUS | Status: AC
  Administered 2013-05-08: 1 g via INTRAVENOUS
  Filled 2013-05-08: qty 10

## 2013-05-08 MED ORDER — DEXTROSE 5 % IV SOLN
1.0000 g | Freq: Three times a day (TID) | INTRAVENOUS | Status: DC
  Administered 2013-05-08 – 2013-05-09 (×2): 1 g via INTRAVENOUS
  Filled 2013-05-08 (×4): qty 1

## 2013-05-08 MED ORDER — LEVETIRACETAM 500 MG PO TABS
500.0000 mg | ORAL_TABLET | Freq: Two times a day (BID) | ORAL | Status: DC
  Administered 2013-05-08 – 2013-05-09 (×2): 500 mg via ORAL
  Filled 2013-05-08 (×3): qty 1

## 2013-05-08 MED ORDER — SODIUM CHLORIDE 0.9 % IV SOLN
INTRAVENOUS | Status: AC
  Administered 2013-05-08: 20:00:00 via INTRAVENOUS

## 2013-05-08 MED ORDER — PRAMIPEXOLE DIHYDROCHLORIDE 0.25 MG PO TABS
0.2500 mg | ORAL_TABLET | Freq: Every day | ORAL | Status: DC
  Administered 2013-05-08: 0.25 mg via ORAL
  Filled 2013-05-08 (×2): qty 1

## 2013-05-08 MED ORDER — IOHEXOL 300 MG/ML  SOLN
75.0000 mL | Freq: Once | INTRAMUSCULAR | Status: AC | PRN
Start: 2013-05-08 — End: 2013-05-08
  Administered 2013-05-08: 75 mL via INTRAVENOUS

## 2013-05-08 MED ORDER — DICLOFENAC SODIUM 1 % TD GEL: 1.0000 "application " | Freq: Every day | TRANSDERMAL | Status: DC | PRN

## 2013-05-08 NOTE — ED Notes (Signed)
Son states the pt was started on zpak Wednesday for URI. Today son went to check and her temperature was 104 at home so brought her to the ED, he states shes had 2 seizures today, she is epileptic. The pt c/o R ear pain at this time

## 2013-05-08 NOTE — ED Notes (Signed)
Pt. In a wheelchair, son called to nurse and pt. Having a seizure lasting 30 seconds. After seizure pt. Alert to name. Called to be triaged.

## 2013-05-08 NOTE — ED Notes (Signed)
Husband house phone: [phone] call when room assigned

## 2013-05-08 NOTE — ED Provider Notes (Signed)
CSN: [id number]     Arrival date & time 05/08/13  1414 History   First MD Initiated Contact with Patient 05/08/13 1456     Chief Complaint  Patient presents with  . Fever     (Consider location/radiation/quality/duration/timing/severity/associated sxs/prior Treatment) HPI Patient presents with concerns of fever, right ear pain, shaking. Symptoms began within the past week, subtly. There was initially an associated cough, mild congestion, and the patient started azithromycin 4 days ago. Currently the patient complains of fever, right ear pain, sore, severe, nonradiating. She also had multiple episodes of shaking during this illness. Some of these episodes of shaking have been similar to prior seizure activity, but others are unusual for her. History of present illness is according to the patient and a family member.  Past Medical History  Diagnosis Date  . Vertigo   . Constipation   . Restless leg   . High cholesterol   . Chronic bronchitis     "yearly; when the weather changes" (04/22/2012)  . JIRCVELF(810.1)     "1/wk" (04/22/2012)  . Depression   . Epilepsy     "been having them right often here lately" (04/22/2012)  . Seizures   . Arthritis     "knees" (04/22/2012)  . Osteoarthritis     [name] 04/22/2012  . Colon polyps     adenomatous and hyperplastic-  . Rectal bleed     in toilet- bright red  . Other convulsions 05/21/12    non-epileptic spells   Past Surgical History  Procedure Laterality Date  . Total hip arthroplasty Left 1993; 1995; 2000  . Abdominal hysterectomy  2001  . Colonoscopy    . Joint replacement    . Mass excision  10/22/2011    Procedure: EXCISION MASS;  Surgeon: [name], MD;  Location: [place];  Service: General;  Laterality: Right;  excision right buttock mass  . Bladder suspension    . Cesarean section  1987; 1988  . Bunionectomy Left 2000   Family History  Problem Relation Age of Onset  . Cancer Mother   . Cancer Brother   .  Diabetes Brother   . Cancer Maternal Aunt   . Diabetes Maternal Aunt    History  Substance Use Topics  . Smoking status: Never Smoker   . Smokeless tobacco: Never Used  . Alcohol Use: No   OB History   Grav Para Term Preterm Abortions TAB SAB Ect Mult Living                 Review of Systems  Constitutional:       Per HPI, otherwise negative  HENT:       Per HPI, otherwise negative  Respiratory:       Per HPI, otherwise negative  Cardiovascular:       Per HPI, otherwise negative  Gastrointestinal: Negative for vomiting.  Endocrine:       Negative aside from HPI  Genitourinary:       Neg aside from HPI   Musculoskeletal:       Per HPI, otherwise negative  Skin: Negative.   Neurological: Positive for tremors, seizures and headaches. Negative for syncope.      Allergies  Vimpat; Benadryl; Dilantin; Melatonin; Ultram; Vicodin; Betadine; Codeine; Latex; Penicillins; and Tape  Home Medications   Prior to Admission medications   Medication Sig Start Date End Date Taking? Authorizing Provider  azithromycin (ZITHROMAX) 250 MG tablet Take 250-500 mg by mouth daily. 5 day course: take 2  tablets (500 mg) by mouth 1st day, then take 1 tablet (250 mg) on days 2-5 - started 05/05/13   Yes Historical Provider, MD  clonazePAM (KLONOPIN) 0.5 MG tablet Take 0.5 mg by mouth 3 (three) times daily.   Yes Historical Provider, MD  diclofenac sodium (VOLTAREN) 1 % GEL Apply 1 application topically daily as needed (foot pain).   Yes Historical Provider, MD  FLUoxetine (PROZAC) 20 MG capsule Take 40 mg by mouth daily.    Yes Historical Provider, MD  gabapentin (NEURONTIN) 600 MG tablet Take 600-1,200 mg by mouth 4 (four) times daily. Take 1 capsule (600 mg) 3 times daily and take 2 capsules (1200 mg) at bedtime 08/25/12  Yes Carmen Dohmeier, MD  Iron-Vitamins (GERITOL PO) Take 1 tablet by mouth daily.   Yes Historical Provider, MD  lamoTRIgine (LAMICTAL) 100 MG tablet Take 1 tablet (100 mg  total) by mouth 2 (two) times daily. 02/02/13  Yes Philmore Pali, NP  levETIRAcetam (KEPPRA) 500 MG tablet Take 1 tablet (500 mg total) by mouth 2 (two) times daily. 04/25/13  Yes Philmore Pali, NP  omega-3 acid ethyl esters (LOVAZA) 1 G capsule Take 2 capsules (2 g total) by mouth 2 (two) times daily. 04/21/13  Yes Lysbeth Penner, FNP  pramipexole (MIRAPEX) 0.125 MG tablet Take 0.25 mg by mouth at bedtime.  07/10/12  Yes Historical Provider, MD  vitamin B-12 (CYANOCOBALAMIN) 1000 MCG tablet Take 1,000 mcg by mouth daily.   Yes Historical Provider, MD  benzonatate (TESSALON PERLES) 100 MG capsule Take 1 capsule (100 mg total) by mouth 3 (three) times daily as needed. 04/21/13   Lysbeth Penner, FNP   BP 113/71  Pulse 86  Temp(Src) 104 F (40 C) (Rectal)  Resp 18  Ht 5' (1.524 m)  Wt 164 lb (74.39 kg)  BMI 32.03 kg/m2  SpO2 98% Physical Exam  Nursing note and vitals reviewed. Constitutional: She is oriented to person, place, and time. She appears well-developed and well-nourished. She appears distressed.  Middle aged female lying in bed with eyes covered, shaking, though speaking clearly, interacting appropriately.  HENT:  Head: Normocephalic and atraumatic.    Right Ear: Tympanic membrane is injected. A middle ear effusion is present. No hemotympanum.  Left Ear: A middle ear effusion is present.  Mouth/Throat: Uvula is midline.  Eyes: Conjunctivae and EOM are normal.  Cardiovascular: Normal rate and regular rhythm.   Pulmonary/Chest: Effort normal and breath sounds normal. No stridor. No respiratory distress.  Abdominal: She exhibits no distension.  Musculoskeletal: She exhibits no edema.  Neurological: She is alert and oriented to person, place, and time. She displays tremor. No cranial nerve deficit. She exhibits normal muscle tone. She displays no seizure activity. Coordination normal.  Skin: Skin is warm and dry.  Psychiatric: She has a normal mood and affect.    ED Course  Procedures  (including critical care time) Labs Review Labs Reviewed  BASIC METABOLIC PANEL - Abnormal; Notable for the following:    Sodium 133 (*)    Chloride 95 (*)    All other components within normal limits  RAPID STREP SCREEN  CBC  I-STAT CG4 LACTIC ACID, ED  I-STAT CG4 LACTIC ACID, ED    Imaging Review Dg Chest 2 View  05/08/2013   CLINICAL DATA:  Fever and history of chronic bronchitis  EXAM: CHEST  2 VIEW  COMPARISON:  DG CHEST 2 VIEW dated 04/12/2013; DG CHEST 1V PORT dated 12/14/2012  FINDINGS: The previously demonstrated  density in the right upper lobe has nearly totally resolved. Faint increased density in the right superhilar region process. There is new increased density inferiorly in the retrocardiac region on the left worrisome for atelectasis or developing pneumonia.  The cardiopericardial silhouette is normal in size. The pulmonary vascularity is not engorged. The trachea is midline. There is no pleural effusion or pneumothorax. The observed portions of the bony thorax exhibit no acute abnormalities. An old fracture of the lateral aspect of the right seventh rib is present.  IMPRESSION: 1. There is new increased density in the left lower lobe worrisome for atelectasis or pneumonia. 2. Previously demonstrated density in the right upper hemithorax has nearly totally cleared but a small amount of persistent suprahilar density is present. 3. Follow-up chest films or chest CT are recommended.   Electronically Signed   By: David  Martinique   On: 05/08/2013 17:11   Ct Temporal Bones W/cm  05/08/2013   CLINICAL DATA:  Fever.  Assess for mastoiditis.  EXAM: CT TEMPORAL BONES WITH CONTRAST  TECHNIQUE: Axial and coronal plane CT imaging of the petrous temporal bones was performed with thin-collimation image reconstruction after intravenous contrast administration. Multiplanar CT image reconstructions were also generated.  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 On the right, patient does have opacification of the external auditory canal. There is fluid density material in the middle ear surrounding much of the ossicular chain. A few inferior mastoid air cells are opacified. The attic region is not opacified. Inner ear structures appear normal.  On the left, there are no inflammatory changes.  There is some mucosal inflammation in the right ethmoid sinuses, sphenoid sinus and the maxillary sinuses right more than left.  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 There is no inflammatory change on the left.  On the right, the external auditory canal is completely opacified by inflammatory swelling and or fluid. There is a moderate amount of fluid in the middle ear but no evidence of ossicular chain destruction. A few of the developed inferior mastoid air cells contain fluid but there is no destructive change in the mastoid region. Findings are consistent with external otitis, otitis media but not mastoiditis.  Some mucosal inflammation of the paranasal sinuses, right more than left.   Electronically Signed   By: Nelson Chimes M.D.   On: 05/08/2013 17:56    On repeat exam the patient has borderline hypotension, 95/50. IV fluids are running. Fever has disappeared. 5:03 PM Patient afefbrile     MDM   Patient presents with concern U. fever, seizure activity, cough. Patient is a poor story, but it seems as though she has recently completed therapy for URI, and previously had a pneumonia. Today the patient has tenderness in her mastoid concern for mastoiditis, but this is not demonstrated on CT scan. Patient's evaluation demonstrates left lower lobe pneumonia, and given the new seizure activity, fever, portal hypertension she was admitted for further evaluation and management.   Carmin Muskrat,  MD 05/08/13 [phone]

## 2013-05-08 NOTE — Progress Notes (Addendum)
ANTIBIOTIC CONSULT NOTE - INITIAL  Pharmacy Consult for Vancomycin + Cefepime Indication: rule out pneumonia  Allergies  Allergen Reactions  . Vimpat [Lacosamide] Other (See Comments)    Severe dizziness  . Benadryl [Diphenhydramine Hcl] Other (See Comments)    seizures  . Dilantin [Phenytoin Sodium Extended] Other (See Comments)    Elevated LFT's  . Melatonin Other (See Comments)    Unknown  . Ultram [Tramadol Hcl] Other (See Comments)    Seizures  . Vicodin [Hydrocodone-Acetaminophen] Itching  . Betadine [Povidone Iodine] Rash  . Codeine Itching and Rash    seizures  . Latex Rash  . Penicillins Itching and Rash  . Tape Rash    Paper tape please    Patient Measurements: Height: 5' (152.4 cm) Weight: 164 lb (74.39 kg) IBW/kg (Calculated) : 45.5  Vital Signs: Temp: 99.9 F (37.7 C) (04/26 1630) Temp src: Oral (04/26 1630) BP: 107/68 mmHg (04/26 1915) Pulse Rate: 71 (04/26 1915) Intake/Output from previous day:   Intake/Output from this shift:    Labs:  Recent Labs  05/08/13 1441  WBC 9.2  HGB 12.6  PLT 269  CREATININE 0.65   Estimated Creatinine Clearance: 78.4 ml/min (by C-G formula based on Cr of 0.65). No results found for this basename: VANCOTROUGH, Corlis Leak, VANCORANDOM, [place], GENTPEAK, GENTRANDOM, TOBRATROUGH, TOBRAPEAK, TOBRARND, AMIKACINPEAK, AMIKACINTROU, AMIKACIN,  in the last 72 hours   Microbiology: Recent Results (from the past 720 hour(s))  RAPID STREP SCREEN     Status: None   Collection Time    05/08/13  4:00 PM      Result Value Ref Range Status   Streptococcus, Group A Screen (Direct) NEGATIVE  NEGATIVE Final   Comment: (NOTE)     A Rapid Antigen test may result negative if the antigen level in the     sample is below the detection level of this test. The FDA has not     cleared this test as a stand-alone test therefore the rapid antigen     negative result has reflexed to a Group A Strep culture.    Medical History: Past  Medical History  Diagnosis Date  . Vertigo   . Constipation   . Restless leg   . High cholesterol   . Chronic bronchitis     "yearly; when the weather changes" (04/22/2012)  . VEHMCNOB(096.2)     "1/wk" (04/22/2012)  . Depression   . Epilepsy     "been having them right often here lately" (04/22/2012)  . Seizures   . Arthritis     "knees" (04/22/2012)  . Osteoarthritis     [name] 04/22/2012  . Colon polyps     adenomatous and hyperplastic-  . Rectal bleed     in toilet- bright red  . Other convulsions 05/21/12    non-epileptic spells    Assessment: 48 y.o. M who presented to the [facility] on 4/26 with fever, cough, congestion, and shaking. The patient had recently started Azithromycin 4 days prior to presentation for CAP. CXR shows LLL atelectasis vs PNA. Pharmacy was consulted to start Vancomycin + Cefepime or broadened PNA coverage. Wt: 74 kg, SCr 0.65, CrCl~70-80 ml/min.   The patient has an allergy to PCN of "rash" but has tolerated cephalosporins in the past.   Goal of Therapy:  Vancomycin trough level 15-20 mcg/ml Proper antibiotics for infection/cultures adjusted for renal/hepatic function   Plan:  1. Vancomycin 1g IV every 12 hours 2. Cefepime 1g IV every 8 hours  3. Will continue to  follow renal function, culture results, LOT, and antibiotic de-escalation plans   Alycia Rossetti, PharmD, BCPS Clinical Pharmacist Pager: [phone] 05/08/2013 7:45 PM

## 2013-05-08 NOTE — H&P (Addendum)
[facility] History and Physical  [name] DDU:[id number] DOB: 1965/12/29 DOA: 05/08/2013  Referring physician: ER physician PCP: Monica Gainer, MD   Chief Complaint: fever  HPI:  48 year old female with past medical history of seizure disorder, depression, recently seen in ED for cough, fever and treated with azithromycin for upper respiratory infection who now comes back to ED due to fevers at home.  Her cough is productive of clear sputum, she has occasional shortness of breath but no chest pain. She also reported pain in the right ear but no discharge from the right ear and no hearing loss. No abdominal pain, no nausea or vomiting. No reports of diarrhea or constipation. No pain on urination and no blood in stool or urine. No dizziness.  In ED, her vitals were stable, BP was 101/72, HR 71, Tmax 104, oxygen saturation was 97% on room air. Her blood was unremarkable. CXR showed new increased density in the left lower lobe worrisome for atelectasis or pneumonia; seen also was previously demonstrated density in the right upper hemithorax has nearly cleared but a small amount of persistent suprahilar density is present. CT temporal bones showed on the right, the external auditory canal is completely opacified by inflammatory swelling and or fluid, findings are consistent with external otitis, otitis media but not mastoiditis. She was given rocephin in ED. [facility] asked to admit for further treatment.  Assessment and Plan:  Principal Problem:   CAP (community acquired pneumonia) - on CXR there is a new increased left lower lobe density; there is also previously seen right hilar density for which order was placed for CT chest for further evaluation - started empirically on vanco and cefepime (as she also has otitis externa) - follow up blood culture results, resp culture, HIV, legionella and strep pneumonia Active Problems:   Right otitis externa, otitis media - cefepime would cover for  otitis externa and media   Seizure disorder - continue keppra and Lamictal  - seizure precautions   Radiological Exams on Admission: Dg Chest 2 View 05/08/2013    IMPRESSION: 1. There is new increased density in the left lower lobe worrisome for atelectasis or pneumonia. 2. Previously demonstrated density in the right upper hemithorax has nearly totally cleared but a small amount of persistent suprahilar density is present. 3. Follow-up chest films or chest CT are recommended.     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 There is no inflammatory change on the left.  On the right, the external auditory canal is completely opacified by inflammatory swelling and or fluid. There is a moderate amount of fluid in the middle ear but no evidence of ossicular chain destruction. A few of the developed inferior mastoid air cells contain fluid but there is no destructive change in the mastoid region. Findings are consistent with external otitis, otitis media but not mastoiditis.  Some mucosal inflammation of the paranasal sinuses, right more than left.    Code Status: Full Family Communication: Pt at bedside Disposition Plan: Admit for further evaluation  Monica Lis, MD  [facility] Pager [phone]  Review of Systems:  Constitutional: positive for fever, chills. Negative for diaphoresis.  HENT: right ear pain, no sore throat  Eyes: Negative for blurred vision, double vision, photophobia, pain, discharge and redness.  Respiratory: Negative for cough, hemoptysis, sputum production, shortness of breath, wheezing and stridor.   Cardiovascular: Negative for chest pain, palpitations,  orthopnea, claudication and leg swelling.  Gastrointestinal: Negative for nausea, vomiting and abdominal pain. Negative for heartburn, constipation, blood in stool and melena.  Genitourinary: Negative for dysuria, urgency, frequency,  hematuria and flank pain.  Musculoskeletal: Negative for myalgias, back pain, joint pain and falls.  Skin: Negative for itching and rash.  Neurological: Negative for dizziness and weakness. Negative for tingling, tremors, sensory change, speech change, focal weakness, loss of consciousness and headaches.  [name]/Heme/Allergies: Negative for environmental allergies and polydipsia. Does not bruise/bleed easily.  Psychiatric/Behavioral: Negative for suicidal ideas. The patient is not nervous/anxious.      Past Medical History  Diagnosis Date  . Vertigo   . Constipation   . Restless leg   . High cholesterol   . Chronic bronchitis     "yearly; when the weather changes" (04/22/2012)  . XLKGMWNU(272.5)     "1/wk" (04/22/2012)  . Depression   . Epilepsy     "been having them right often here lately" (04/22/2012)  . Seizures   . Arthritis     "knees" (04/22/2012)  . Osteoarthritis     [name] 04/22/2012  . Colon polyps     adenomatous and hyperplastic-  . Rectal bleed     in toilet- bright red  . Other convulsions 05/21/12    non-epileptic spells   Past Surgical History  Procedure Laterality Date  . Total hip arthroplasty Left 1993; 1995; 2000  . Abdominal hysterectomy  2001  . Colonoscopy    . Joint replacement    . Mass excision  10/22/2011    Procedure: EXCISION MASS;  Surgeon: Monica Bowie, MD;  Location: [place];  Service: General;  Laterality: Right;  excision right buttock mass  . Bladder suspension    . Cesarean section  1987; 1988  . Bunionectomy Left 2000   Social History:  reports that she has never smoked. She has never used smokeless tobacco. She reports that she does not drink alcohol or use illicit drugs.  Allergies  Allergen Reactions  . Vimpat [Lacosamide] Other (See Comments)    Severe dizziness  . Benadryl [Diphenhydramine Hcl] Other (See Comments)    seizures  . Dilantin [Phenytoin Sodium Extended] Other (See Comments)    Elevated LFT's  . Melatonin Other  (See Comments)    Unknown  . Ultram [Tramadol Hcl] Other (See Comments)    Seizures  . Vicodin [Hydrocodone-Acetaminophen] Itching  . Betadine [Povidone Iodine] Rash  . Codeine Itching and Rash    seizures  . Latex Rash  . Penicillins Itching and Rash  . Tape Rash    Paper tape please     Family History  Problem Relation Age of Onset  . Cancer Mother   . Cancer Brother   . Diabetes Brother   . Cancer Maternal Aunt   . Diabetes Maternal Aunt     Prior to Admission medications   Medication Sig Start Date End Date Taking? Authorizing Provider  azithromycin (ZITHROMAX) 250 MG tablet Take 250-500 mg by mouth daily. 5 day course: take 2 tablets (500 mg) by mouth 1st day, then take 1 tablet (250 mg) on days 2-5 - started 05/05/13   Yes Historical Provider, MD  clonazePAM (KLONOPIN) 0.5 MG tablet Take 0.5 mg by mouth 3 (three) times daily.   Yes Historical Provider, MD  diclofenac sodium (VOLTAREN) 1 % GEL Apply 1 application topically daily as needed (foot pain).   Yes Historical Provider, MD  FLUoxetine (PROZAC) 20 MG capsule Take 40 mg by mouth daily.  Yes Historical Provider, MD  gabapentin (NEURONTIN) 600 MG tablet Take 600-1,200 mg by mouth 4 (four) times daily. Take 1 capsule (600 mg) 3 times daily and take 2 capsules (1200 mg) at bedtime 08/25/12  Yes Carmen Dohmeier, MD  Iron-Vitamins (GERITOL PO) Take 1 tablet by mouth daily.   Yes Historical Provider, MD  lamoTRIgine (LAMICTAL) 100 MG tablet Take 1 tablet (100 mg total) by mouth 2 (two) times daily. 02/02/13  Yes Philmore Pali, NP  levETIRAcetam (KEPPRA) 500 MG tablet Take 1 tablet (500 mg total) by mouth 2 (two) times daily. 04/25/13  Yes Philmore Pali, NP  omega-3 acid ethyl esters (LOVAZA) 1 G capsule Take 2 capsules (2 g total) by mouth 2 (two) times daily. 04/21/13  Yes Lysbeth Penner, FNP  pramipexole (MIRAPEX) 0.125 MG tablet Take 0.25 mg by mouth at bedtime.  07/10/12  Yes Historical Provider, MD  vitamin B-12 (CYANOCOBALAMIN)  1000 MCG tablet Take 1,000 mcg by mouth daily.   Yes Historical Provider, MD  benzonatate (TESSALON PERLES) 100 MG capsule Take 1 capsule (100 mg total) by mouth 3 (three) times daily as needed. 04/21/13   Lysbeth Penner, FNP   Physical Exam: Filed Vitals:   05/08/13 1424 05/08/13 1503 05/08/13 1545 05/08/13 1630  BP: 101/72 113/71 107/65 114/62  Pulse: 98 86 81 83  Temp: 103.1 F (39.5 C) 104 F (40 C)  99.9 F (37.7 C)  TempSrc: Oral Rectal  Oral  Resp: 20 18 15 20   Height: 5' (1.524 m)     Weight: 74.39 kg (164 lb)     SpO2: 97% 98% 97% 97%    Physical Exam  Constitutional: Appears well-developed and well-nourished. No distress.  HENT: Normocephalic. Sinus tenderness, maxillary Eyes: Conjunctivae and EOM are normal. PERRLA, no scleral icterus.  Neck: Normal ROM. Neck supple. No JVD. No tracheal deviation. No thyromegaly.  CVS: RRR, S1/S2 +, no murmurs, no gallops, no carotid bruit.  Pulmonary: Effort and breath sounds normal, no stridor, rhonchi, wheezes, rales.  Abdominal: Soft. BS +,  no distension, tenderness, rebound or guarding.  Musculoskeletal: Normal range of motion. No edema and no tenderness.  Lymphadenopathy: No lymphadenopathy noted, cervical, inguinal. Neuro: Alert. Normal reflexes, muscle tone coordination. No cranial nerve deficit. Skin: Skin is warm and dry. No rash noted. Not diaphoretic. No erythema. No pallor.  Psychiatric: Normal mood and affect. Behavior, judgment, thought content normal.   Labs on Admission:  Basic Metabolic Panel:  Recent Labs Lab 05/08/13 1441  NA 133*  K 3.7  CL 95*  CO2 21  GLUCOSE 89  BUN 6  CREATININE 0.65  CALCIUM 9.6   Liver Function Tests: No results found for this basename: AST, ALT, ALKPHOS, BILITOT, PROT, ALBUMIN,  in the last 168 hours No results found for this basename: LIPASE, AMYLASE,  in the last 168 hours No results found for this basename: AMMONIA,  in the last 168 hours CBC:  Recent Labs Lab  05/08/13 1441  WBC 9.2  HGB 12.6  HCT 36.4  MCV 87.7  PLT 269   Cardiac Enzymes: No results found for this basename: CKTOTAL, CKMB, CKMBINDEX, TROPONINI,  in the last 168 hours BNP: No components found with this basename: POCBNP,  CBG: No results found for this basename: GLUCAP,  in the last 168 hours  If 7PM-7AM, please contact night-coverage [email or web address] Password [facility] 05/08/2013, 7:16 PM

## 2013-05-09 ENCOUNTER — Encounter (HOSPITAL_COMMUNITY): Payer: Self-pay | Admitting: *Deleted

## 2013-05-09 DIAGNOSIS — H6690 Otitis media, unspecified, unspecified ear: Secondary | ICD-10-CM

## 2013-05-09 LAB — COMPREHENSIVE METABOLIC PANEL
ALT: 26 U/L (ref 0–35)
AST: 23 U/L (ref 0–37)
Albumin: 3.1 g/dL — ABNORMAL LOW (ref 3.5–5.2)
Alkaline Phosphatase: 61 U/L (ref 39–117)
BUN: 5 mg/dL — ABNORMAL LOW (ref 6–23)
CO2: 23 mEq/L (ref 19–32)
Calcium: 8.2 mg/dL — ABNORMAL LOW (ref 8.4–10.5)
Chloride: 101 mEq/L (ref 96–112)
Creatinine, Ser: 0.65 mg/dL (ref 0.50–1.10)
GFR calc Af Amer: >90 mL/min (ref >90)
GFR calc non Af Amer: >90 mL/min (ref >90)
Glucose, Bld: 84 mg/dL (ref 70–99)
Potassium: 3.4 mEq/L — ABNORMAL LOW (ref 3.7–5.3)
Sodium: 137 mEq/L (ref 137–147)
Total Bilirubin: 0.3 mg/dL (ref 0.3–1.2)
Total Protein: 6.2 g/dL (ref 6.0–8.3)

## 2013-05-09 LAB — INFLUENZA PANEL BY PCR (TYPE A & B)
H1N1 flu by pcr: NOT DETECTED
Influenza A By PCR: NEGATIVE
Influenza B By PCR: NEGATIVE

## 2013-05-09 LAB — CBC
HCT: 33.4 % — ABNORMAL LOW (ref 36.0–46.0)
Hemoglobin: 11.1 g/dL — ABNORMAL LOW (ref 12.0–15.0)
MCH: 29.9 pg (ref 26.0–34.0)
MCHC: 33.2 g/dL (ref 30.0–36.0)
MCV: 90 fL (ref 78.0–100.0)
Platelets: 239 10*3/uL (ref 150–400)
RBC: 3.71 MIL/uL — ABNORMAL LOW (ref 3.87–5.11)
RDW: 13.7 % (ref 11.5–15.5)
WBC: 7.5 10*3/uL (ref 4.0–10.5)

## 2013-05-09 LAB — RAPID URINE DRUG SCREEN, HOSP PERFORMED
Amphetamines: NOT DETECTED
Barbiturates: NOT DETECTED
Benzodiazepines: NOT DETECTED
Cocaine: NOT DETECTED
Opiates: NOT DETECTED
Tetrahydrocannabinol: NOT DETECTED

## 2013-05-09 LAB — GLUCOSE, CAPILLARY: Glucose-Capillary: 85 mg/dL (ref 70–99)

## 2013-05-09 LAB — HIV ANTIBODY (ROUTINE TESTING W REFLEX): HIV 1&2 Ab, 4th Generation: NONREACTIVE

## 2013-05-09 LAB — STREP PNEUMONIAE URINARY ANTIGEN: Strep Pneumo Urinary Antigen: NEGATIVE

## 2013-05-09 MED ORDER — LEVOFLOXACIN 750 MG PO TABS
750.0000 mg | ORAL_TABLET | Freq: Every day | ORAL | Status: DC
  Administered 2013-05-09: 750 mg via ORAL
  Filled 2013-05-09: qty 1

## 2013-05-09 MED ORDER — CIPROFLOXACIN-DEXAMETHASONE 0.3-0.1 % OT SUSP
4.0000 [drp] | Freq: Two times a day (BID) | OTIC | Status: DC
  Administered 2013-05-09: 4 [drp] via OTIC
  Filled 2013-05-09: qty 7.5

## 2013-05-09 MED ORDER — LEVOFLOXACIN 750 MG PO TABS: 750.0000 mg | ORAL_TABLET | Freq: Every day | ORAL | Status: DC

## 2013-05-09 MED ORDER — CIPROFLOXACIN-DEXAMETHASONE 0.3-0.1 % OT SUSP: 4.0000 [drp] | Freq: Two times a day (BID) | OTIC | Status: DC

## 2013-05-09 MED ORDER — POTASSIUM CHLORIDE CRYS ER 20 MEQ PO TBCR
40.0000 meq | EXTENDED_RELEASE_TABLET | Freq: Once | ORAL | Status: AC
  Administered 2013-05-09: 40 meq via ORAL
  Filled 2013-05-09: qty 2

## 2013-05-09 NOTE — Progress Notes (Signed)
ANTIBIOTIC CONSULT NOTE - INITIAL  Pharmacy Consult for Levaquin Indication: CAP  Allergies  Allergen Reactions  . Vimpat [Lacosamide] Other (See Comments)    Severe dizziness  . Benadryl [Diphenhydramine Hcl] Other (See Comments)    seizures  . Dilantin [Phenytoin Sodium Extended] Other (See Comments)    Elevated LFT's  . Melatonin Other (See Comments)    Unknown  . Ultram [Tramadol Hcl] Other (See Comments)    Seizures  . Vicodin [Hydrocodone-Acetaminophen] Itching  . Betadine [Povidone Iodine] Rash  . Codeine Itching and Rash    seizures  . Latex Rash  . Penicillins Itching and Rash  . Tape Rash    Paper tape please    Patient Measurements: Height: 5\' 1"  (154.9 cm) Weight: 169 lb (76.658 kg) (b scale) IBW/kg (Calculated) : 47.8  Vital Signs: Temp: 98.7 F (37.1 C) (04/27 0517) Temp src: Oral (04/27 0517) BP: 94/58 mmHg (04/27 0900) Pulse Rate: 67 (04/27 0900) Intake/Output from previous day:   Intake/Output from this shift: Total I/O In: 60 [P.O.:60] Out: 800 [Urine:800]  Labs:  Recent Labs  05/08/13 1441 05/08/13 2130 05/09/13 0445  WBC 9.2 8.0 7.5  HGB 12.6 12.0 11.1*  PLT 269 249 239  CREATININE 0.65 0.70 0.65   Estimated Creatinine Clearance: 81.5 ml/min (by C-G formula based on Cr of 0.65). No results found for this basename: VANCOTROUGH, Corlis Leak, VANCORANDOM, [place], GENTPEAK, GENTRANDOM, TOBRATROUGH, TOBRAPEAK, TOBRARND, AMIKACINPEAK, AMIKACINTROU, AMIKACIN,  in the last 72 hours   Microbiology: Recent Results (from the past 720 hour(s))  RAPID STREP SCREEN     Status: None   Collection Time    05/08/13  4:00 PM      Result Value Ref Range Status   Streptococcus, Group A Screen (Direct) NEGATIVE  NEGATIVE Final   Comment: (NOTE)     A Rapid Antigen test may result negative if the antigen level in the     sample is below the detection level of this test. The FDA has not     cleared this test as a stand-alone test therefore the rapid  antigen     negative result has reflexed to a Group A Strep culture.    Medical History: Past Medical History  Diagnosis Date  . Vertigo   . Constipation   . Restless leg   . High cholesterol   . Chronic bronchitis     "yearly; when the weather changes" (04/22/2012)  . OVFIEPPI(951.8)     "1/wk" (04/22/2012)  . Depression   . Epilepsy     "been having them right often here lately" (04/22/2012)  . Seizures   . Arthritis     "knees" (04/22/2012)  . Osteoarthritis     [name] 04/22/2012  . Colon polyps     adenomatous and hyperplastic-  . Rectal bleed     in toilet- bright red  . Other convulsions 05/21/12    non-epileptic spells    Assessment: 48 y.o. M who presented to the [facility] on 4/26 with fever, cough, congestion, and shaking. The patient had recently started Azithromycin 4 days prior to presentation for CAP. CXR shows LLL atelectasis vs PNA. Patient was started on vancomycin and cefepime, pharmacy consulted to narrow to Levaquin for CAP. Also noted with otitis media externa. She has been afebrile since yesterday afternoon and WBC are normal. Renal function is normal.  Goal of Therapy:  Eradication of infection  Plan:  - Levaquin 750 mg PO daily - Pharmacy will adjust dose if needed but signing  off   [place], [place].D., BCPS Clinical Pharmacist Pager: [phone] 05/09/2013 11:27 AM

## 2013-05-09 NOTE — Progress Notes (Signed)
DC IV, DC Tele, DC Home. Discharge instructions and home medications discussed with patient and patient's family member. Patient and family member denied any questions or concerns at this time. Patient leaving unit via wheelchair and appears in no acute distress. 

## 2013-05-09 NOTE — Discharge Summary (Signed)
Physician Discharge Summary  [name] [id number] DOB: 09-08-1965 DOA: 05/08/2013  PCP: Redge Gainer, MD  Admit date: 05/08/2013 Discharge date: 05/09/2013  Recommendations for Outpatient Follow-up:  1. F/u with primary care doctor in 3 weeks for ear check.  Consider evaluation for underlying immunodeficiency if she has been having problems with recurrent ear infections or pneumonia.  Repeat BMP and CBC to f/u hypokalemia and anemia.  Anemia w/u if persists and if not already complete.   2. Repeat CXR in 1 month to ensure PNA has cleared   Discharge Diagnoses:  Principal Problem:   CAP (community acquired pneumonia) Active Problems:   Seizure disorder   Otitis externa   Otitis media, right   Discharge Condition: stable, improved  Diet recommendation: regular  Wt Readings from Last 3 Encounters:  05/09/13 76.658 kg (169 lb)  05/05/13 75.297 kg (166 lb)  04/21/13 74.662 kg (164 lb 9.6 oz)    History of present illness:  48 year old female with past medical history of seizure disorder, depression, recently seen in ED for cough, fever and treated with azithromycin for upper respiratory infection who now comes back to ED due to fevers at home. Her cough is productive of clear sputum, she has occasional shortness of breath but no chest pain. She also reported pain in the right ear but no discharge from the right ear and no hearing loss. No abdominal pain, no nausea or vomiting. No reports of diarrhea or constipation. No pain on urination and no blood in stool or urine. No dizziness.  In ED, her vitals were stable, BP was 101/72, HR 71, Tmax 104, oxygen saturation was 97% on room air. Her blood was unremarkable. CXR showed new increased density in the left lower lobe worrisome for atelectasis or pneumonia; seen also was previously demonstrated density in the right upper hemithorax has nearly cleared but a small amount of persistent suprahilar density is present. CT temporal bones  showed on the right, the external auditory canal is completely opacified by inflammatory swelling and or fluid, findings are consistent with external otitis, otitis media but not mastoiditis. She was given rocephin in ED. [facility] asked to admit for further treatment.  Hospital Course:   CAP.  New LLL PNA with persistent increased density in the right upper lobe consistent with residual pneumonia on CT chest.  She was febrile, but did not meet criteria for sepsis.  S. Pneumonia neg, legionella pending, HIV NR, and influenza PCR neg.  She was started on broad spectrum antibiotics for HCAP and her fever trended down immediately.  She was converted to levofloxacin to complete a seven-day course. Her primary care doctor and followup on the pending results of her lesion no antigen. She should have a repeat chest x-ray in approximately 4 weeks to confirm clearance. If she has had recurrent ear infections or pneumonias, consider immunodeficiency workup.  Right otitis externa and otitis media. CT scan was negative for mastoiditis. The patient is not diabetic and therefore is at lower risk of pseudomonal infection. She was started on levofloxacin for her otitis media and Ciprodex eardrops to help with the pain and swelling for otitis externa. She should have a followup uric p.m. approximately 3 weeks to ensure that her infection is cleared.  Seizure disorder, stable, continued keppra, lamictal, clonazepam, and gabapentin  Mild normocytic anemia, likely delusional from IV fluids. Repeat CBC in a few weeks by primary care doctor. If persistently anemic, further workup for anemia if none are incomplete.  Hypokalemia,  again from IV fluids without potassium. She was given oral potassium supplementation, and advised to have her blood work repeated in approximately one to two weeks primary care doctor.  Procedures:  Chest x-ray  CT temporal bones  CT chest with contrast  Consultations:  None  Discharge  Exam: Filed Vitals:   05/09/13 0900  BP: 94/58  Pulse: 67  Temp:   Resp:    Filed Vitals:   05/08/13 2017 05/09/13 0140 05/09/13 0517 05/09/13 0900  BP: 112/74 92/63 99/67  94/58  Pulse: 74 70 76 67  Temp: 99.3 F (37.4 C) 98.5 F (36.9 C) 98.7 F (37.1 C)   TempSrc: Oral Oral Oral   Resp: 18 18 18    Height: 5\' 1"  (1.549 m)     Weight: 75.796 kg (167 lb 1.6 oz)  76.658 kg (169 lb)   SpO2: 100% 95% 97%     General: Caucasian female, no acute distress HEENT: Normocephalic, atraumatic, moist mucous membranes, pain with movement of the tragus of the right ear, swelling and erythema with purulent discharge seen in the external ear or canal. Unable to visualize the tympanic membrane. Cardiovascular: Regular rate and rhythm, no murmurs rubs or gallops, 2+ pulses, warm extremities Respiratory: Clear to auscultation bilaterally, no increased work of breathing Abdomen: Normal active bowel sounds, soft, nondistended, nontender MSK: No lower extremity edema  Discharge Instructions      Discharge Orders   Future Appointments Provider Department Dept Phone   08/17/2013 3:00 PM [name], MD [facility] [phone]   Future Orders Complete By Expires   Call MD for:  difficulty breathing, headache or visual disturbances  As directed    Call MD for:  extreme fatigue  As directed    Call MD for:  hives  As directed    Call MD for:  persistant dizziness or light-headedness  As directed    Call MD for:  persistant nausea and vomiting  As directed    Call MD for:  severe uncontrolled pain  As directed    Call MD for:  temperature >100.4  As directed    Diet general  As directed    Discharge instructions  As directed    Increase activity slowly  As directed        Medication List    STOP taking these medications       azithromycin 250 MG tablet  Commonly known as:  ZITHROMAX      TAKE these medications       benzonatate 100 MG capsule  Commonly known as:   TESSALON PERLES  Take 1 capsule (100 mg total) by mouth 3 (three) times daily as needed.     ciprofloxacin-dexamethasone otic suspension  Commonly known as:  CIPRODEX  Place 4 drops into the right ear 2 (two) times daily.     clonazePAM 0.5 MG tablet  Commonly known as:  KLONOPIN  Take 0.5 mg by mouth 3 (three) times daily.     diclofenac sodium 1 % Gel  Commonly known as:  VOLTAREN  Apply 1 application topically daily as needed (foot pain).     FLUoxetine 20 MG capsule  Commonly known as:  PROZAC  Take 40 mg by mouth daily.     gabapentin 600 MG tablet  Commonly known as:  NEURONTIN  Take 600-1,200 mg by mouth 4 (four) times daily. Take 1 capsule (600 mg) 3 times daily and take 2 capsules (1200 mg) at bedtime     GERITOL PO  Take 1 tablet by mouth daily.     lamoTRIgine 100 MG tablet  Commonly known as:  LAMICTAL  Take 1 tablet (100 mg total) by mouth 2 (two) times daily.     levETIRAcetam 500 MG tablet  Commonly known as:  KEPPRA  Take 1 tablet (500 mg total) by mouth 2 (two) times daily.     levofloxacin 750 MG tablet  Commonly known as:  LEVAQUIN  Take 1 tablet (750 mg total) by mouth daily.     omega-3 acid ethyl esters 1 G capsule  Commonly known as:  LOVAZA  Take 2 capsules (2 g total) by mouth 2 (two) times daily.     pramipexole 0.125 MG tablet  Commonly known as:  MIRAPEX  Take 0.25 mg by mouth at bedtime.     vitamin B-12 1000 MCG tablet  Commonly known as:  CYANOCOBALAMIN  Take 1,000 mcg by mouth daily.       Follow-up Information   Follow up with Redge Gainer, MD. Schedule an appointment as soon as possible for a visit in 3 weeks.   Specialty:  Family Medicine   Contact information:   [address] [place] [address] [phone]        The results of significant diagnostics from this hospitalization (including imaging, microbiology, ancillary and laboratory) are listed below for reference.    Significant Diagnostic Studies: Dg  Chest 2 View  05/08/2013   CLINICAL DATA:  Fever and history of chronic bronchitis  EXAM: CHEST  2 VIEW  COMPARISON:  DG CHEST 2 VIEW dated 04/12/2013; DG CHEST 1V PORT dated 12/14/2012  FINDINGS: The previously demonstrated density in the right upper lobe has nearly totally resolved. Faint increased density in the right superhilar region process. There is new increased density inferiorly in the retrocardiac region on the left worrisome for atelectasis or developing pneumonia.  The cardiopericardial silhouette is normal in size. The pulmonary vascularity is not engorged. The trachea is midline. There is no pleural effusion or pneumothorax. The observed portions of the bony thorax exhibit no acute abnormalities. An old fracture of the lateral aspect of the right seventh rib is present.  IMPRESSION: 1. There is new increased density in the left lower lobe worrisome for atelectasis or pneumonia. 2. Previously demonstrated density in the right upper hemithorax has nearly totally cleared but a small amount of persistent suprahilar density is present. 3. Follow-up chest films or chest CT are recommended.   Electronically Signed   By: David  Martinique   On: 05/08/2013 17:11   Dg Chest 2 View  04/12/2013   CLINICAL DATA:  Right lateral superior chest pain radiating to back for 3 days with fever and some shortness of breath  EXAM: CHEST  2 VIEW  COMPARISON:  DG CHEST 1V PORT dated 12/14/2012  FINDINGS: Heart size and vascular pattern are normal. Left lung is clear. There are no pleural effusions.  There is an area of opacity in the right upper lobe measuring 8 x 5 cm consistent with consolidation.  IMPRESSION: Right upper lobe consolidation consistent with pneumonia. Suggest radiographic follow-up after appropriate therapy to ensure resolution.   Electronically Signed   By: Skipper Cliche M.D.   On: 04/12/2013 16:51   Ct Chest W Contrast  05/08/2013   CLINICAL DATA:  Follow-up of right upper lobe and left lower lobe  densities  EXAM: CT CHEST WITH CONTRAST  TECHNIQUE: Multidetector CT imaging of the chest was performed during intravenous contrast administration.  CONTRAST:  47mL OMNIPAQUE IOHEXOL 300 MG/ML  SOLN  COMPARISON:  DG CHEST 2 VIEW dated 05/08/2013  FINDINGS: At lung window settings there is persistent patchy density posteriorly in the right upper lobe adjacent to the major fissure. This is demonstrated on images 14-19. A discrete mass is not demonstrated. There is an ill-defined area of parenchymal consolidation in the left lower lobe corresponding to the chest x-ray findings. These findings suggest pneumonia. The right middle lobe and the right lower lobe and the left upper lobe exhibit no infiltrates. No suspicious nodules are demonstrated.  The cardiac chambers are normal in size. The caliber of the thoracic aorta is normal. The central pulmonary vascularity appears normal. The thoracic esophagus is of normal caliber. There is no mediastinal nor hilar lymphadenopathy. There is no pleural nor pericardial effusion.  Within the upper abdomen the observed portions of the liver and spleen exhibit no acute abnormalities.  IMPRESSION: 1. There is confluent density in the left lower lobe consistent with pneumonia. 2. There is a small amount of persistent increased density posteriorly in the right upper lobe consistent with residual pneumonia. 3. There is no mediastinal or hilar lymphadenopathy nor pleural effusion. 4. Follow-up chest films or chest CT is recommended following therapy to assure complete clearing.   Electronically Signed   By: David  Martinique   On: 05/08/2013 20:27   Ct Temporal Bones W/cm  05/08/2013   CLINICAL DATA:  Fever.  Assess for mastoiditis.  EXAM: CT TEMPORAL BONES WITH CONTRAST  TECHNIQUE: Axial and coronal plane CT imaging of the petrous temporal bones was performed with thin-collimation image reconstruction after intravenous contrast administration. Multiplanar CT image reconstructions were also  generated.  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 On the right, patient does have opacification of the external auditory canal. There is fluid density material in the middle ear surrounding much of the ossicular chain. A few inferior mastoid air cells are opacified. The attic region is not opacified. Inner ear structures appear normal.  On the left, there are no inflammatory changes.  There is some mucosal inflammation in the right ethmoid sinuses, sphenoid sinus and the maxillary sinuses right more than left.  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 There is no inflammatory change on the left.  On the right, the external auditory canal is completely opacified by inflammatory swelling and or fluid. There is a moderate amount of fluid in the middle ear but no evidence of ossicular chain destruction. A few of the developed inferior mastoid air cells contain fluid but there is no destructive change in the mastoid region. Findings are consistent with external otitis, otitis media but not mastoiditis.  Some mucosal inflammation of the paranasal sinuses, right more than left.   Electronically Signed   By: Nelson Chimes M.D.   On: 05/08/2013 17:56    Microbiology: Recent Results (from the past 240 hour(s))  RAPID STREP SCREEN     Status: None   Collection Time    05/08/13  4:00 PM      Result Value Ref Range Status   Streptococcus, Group A Screen (Direct) NEGATIVE  NEGATIVE Final   Comment: (NOTE)     A Rapid Antigen test may result negative if the antigen level in the     sample is below the detection level of  this test. The FDA has not     cleared this test as a stand-alone test therefore the rapid antigen     negative result has reflexed to a Group A Strep culture.     Labs: Basic Metabolic  Panel:  Recent Labs Lab 05/08/13 1441 05/08/13 2130 05/09/13 0445  NA 133* 136* 137  K 3.7 3.4* 3.4*  CL 95* 99 101  CO2 21 24 23   GLUCOSE 89 117* 84  BUN 6 6 5*  CREATININE 0.65 0.70 0.65  CALCIUM 9.6 8.6 8.2*  MG  --  1.9  --   PHOS  --  3.0  --    Liver Function Tests:  Recent Labs Lab 05/08/13 2130 05/09/13 0445  AST 21 23  ALT 29 26  ALKPHOS 69 61  BILITOT 0.4 0.3  PROT 6.9 6.2  ALBUMIN 3.5 3.1*   No results found for this basename: LIPASE, AMYLASE,  in the last 168 hours No results found for this basename: AMMONIA,  in the last 168 hours CBC:  Recent Labs Lab 05/08/13 1441 05/08/13 2130 05/09/13 0445  WBC 9.2 8.0 7.5  NEUTROABS  --  4.7  --   HGB 12.6 12.0 11.1*  HCT 36.4 35.2* 33.4*  MCV 87.7 88.9 90.0  PLT 269 249 239   Cardiac Enzymes: No results found for this basename: CKTOTAL, CKMB, CKMBINDEX, TROPONINI,  in the last 168 hours BNP: BNP (last 3 results) No results found for this basename: PROBNP,  in the last 8760 hours CBG:  Recent Labs Lab 05/09/13 [place] 85    Time coordinating discharge:  45 minutes  Signed:  Janece Canterbury  [facility] 05/09/2013, 12:05 PM

## 2013-05-09 NOTE — Care Management Note (Signed)
    Page 1 of 1   05/09/2013     12:10:15 PM CARE MANAGEMENT NOTE 05/09/2013  Patient:  [name], [name]   Account Number:  [id number]  Date Initiated:  05/09/2013  Documentation initiated by:  [facility]  Subjective/Objective Assessment:   48 year old female with past medical history of seizure disorder, depression, recently seen in ED for cough, fever and treated with azbx for upper respiratory infection who now comes back to ED due to fevers at home.// Home with spouse     Action/Plan:   IV abx// Access for [facility] services   Anticipated DC Date:  05/11/2013   Anticipated DC Plan:  [place]  CM consult      Choice offered to / List presented to:             Status of service:  In process, will continue to follow Medicare Important Message given?   (If response is "NO", the following Medicare IM given date fields will be blank) Date Medicare IM given:   Date Additional Medicare IM given:    Discharge Disposition:    Per UR Regulation:  Reviewed for med. necessity/level of care/duration of stay  If discussed at [place] of Stay Meetings, dates discussed:    Comments:

## 2013-05-10 LAB — URINE CULTURE
Colony Count: NO GROWTH
Culture: NO GROWTH

## 2013-05-10 LAB — CULTURE, GROUP A STREP

## 2013-05-11 LAB — LEGIONELLA ANTIGEN, URINE: Legionella Antigen, Urine: NEGATIVE

## 2013-05-27 ENCOUNTER — Encounter: Payer: Self-pay | Admitting: Nurse Practitioner

## 2013-05-27 ENCOUNTER — Ambulatory Visit (INDEPENDENT_AMBULATORY_CARE_PROVIDER_SITE_OTHER): Payer: Medicare Other | Admitting: Nurse Practitioner

## 2013-05-27 VITALS — BP 102/74 | HR 89 | Temp 98.6°F | Ht 61.0 in | Wt 170.8 lb

## 2013-05-27 DIAGNOSIS — R059 Cough, unspecified: Secondary | ICD-10-CM

## 2013-05-27 DIAGNOSIS — J189 Pneumonia, unspecified organism: Secondary | ICD-10-CM

## 2013-05-27 DIAGNOSIS — Z09 Encounter for follow-up examination after completed treatment for conditions other than malignant neoplasm: Secondary | ICD-10-CM

## 2013-05-27 DIAGNOSIS — R05 Cough: Secondary | ICD-10-CM

## 2013-05-27 MED ORDER — BENZONATATE 100 MG PO CAPS
100.0000 mg | ORAL_CAPSULE | Freq: Three times a day (TID) | ORAL | Status: DC | PRN
Start: 2013-05-27 — End: 2013-08-03

## 2013-05-27 NOTE — Progress Notes (Signed)
   Subjective:    Patient ID: [name], female    DOB: February 21, 1965, 48 y.o.   MRN: [id number]  HPI Patient here today for hospital follow up- Was diagnosed with pneumonia- discharged April 28- DOing much better- No SOB or cough over the last week.    Review of Systems  Constitutional: Negative for fever and chills.  HENT: Negative.   Respiratory: Positive for cough (slight).   Cardiovascular: Negative.   Gastrointestinal: Negative.   Genitourinary: Negative.   Neurological: Negative.   Psychiatric/Behavioral: Negative.   All other systems reviewed and are negative.      Objective:   Physical Exam  Constitutional: She is oriented to person, place, and time. She appears well-developed and well-nourished.  HENT:  Right Ear: External ear normal.  Left Ear: External ear normal.  Nose: Nose normal.  Mouth/Throat: Oropharynx is clear and moist.  Eyes: Pupils are equal, round, and reactive to light.  Neck: Normal range of motion. Neck supple.  Cardiovascular: Normal rate, regular rhythm and normal heart sounds.   Pulmonary/Chest: Effort normal and breath sounds normal. No respiratory distress. She has no wheezes. She has no rales. She exhibits no tenderness.  Lymphadenopathy:    She has no cervical adenopathy.  Neurological: She is alert and oriented to person, place, and time.  Skin: Skin is warm and dry.  Psychiatric: She has a normal mood and affect. Her behavior is normal. Judgment and thought content normal.    BP 102/74  Pulse 89  Temp(Src) 98.6 F (37 C) (Oral)  Ht 5\' 1"  (1.549 m)  Wt 170 lb 12.8 oz (77.474 kg)  BMI 32.29 kg/m2       Assessment & Plan:   1. Cough   2. Hospital discharge follow-up   3. CAP (community acquired pneumonia)   Hospital records reviewed continue all meds Tessalon perles as ordered Follow up in 1 month to repeat chest xray  [name], FNP

## 2013-06-09 ENCOUNTER — Telehealth: Payer: Self-pay | Admitting: Family Medicine

## 2013-06-09 ENCOUNTER — Other Ambulatory Visit: Payer: Self-pay | Admitting: Family Medicine

## 2013-06-09 ENCOUNTER — Telehealth: Payer: Self-pay | Admitting: Neurology

## 2013-06-09 MED ORDER — CLONAZEPAM 0.5 MG PO TABS: 0.5000 mg | ORAL_TABLET | Freq: Three times a day (TID) | ORAL | Status: DC

## 2013-06-09 NOTE — Telephone Encounter (Signed)
Clonazepam 0.5mg  1 po TID #90 1 refill

## 2013-06-09 NOTE — Telephone Encounter (Signed)
Per previous notes, we are not filling this med.  I have previously psoken to the patient regarding this. [facility] OV note says:  3. Continue regular follow up with your Psychiatrist. She is advised that she needs to have her Klonopin refilled through Psychiatry.  I called the patient back at home. She was not there. I called alternate number. Spoke with patient. Advised we will not be filling this med, she needs to get it from other provider per notes.       I called again.  Explained we would not be able to prescribe this med and she would need to contact either her PCP or psych for refills.  She verbalized understanding and says she will reach out to them.

## 2013-06-09 NOTE — Telephone Encounter (Signed)
Patient requesting refill of clonazePAM (KLONOPIN) 0.5 MG tablet.

## 2013-06-09 NOTE — Telephone Encounter (Signed)
Her neurology notes actually state she needs to get her klonopin through her psychiatrist and that is who she needs to go through so have her do this ASAP

## 2013-06-10 NOTE — Telephone Encounter (Signed)
Called into [facility]

## 2013-06-28 ENCOUNTER — Encounter: Payer: Self-pay | Admitting: Nurse Practitioner

## 2013-06-28 ENCOUNTER — Ambulatory Visit (INDEPENDENT_AMBULATORY_CARE_PROVIDER_SITE_OTHER): Payer: Medicare Other

## 2013-06-28 ENCOUNTER — Ambulatory Visit (INDEPENDENT_AMBULATORY_CARE_PROVIDER_SITE_OTHER): Payer: Medicare Other | Admitting: Nurse Practitioner

## 2013-06-28 VITALS — BP 110/80 | HR 83 | Temp 98.1°F | Ht 61.0 in | Wt 174.0 lb

## 2013-06-28 DIAGNOSIS — J189 Pneumonia, unspecified organism: Secondary | ICD-10-CM

## 2013-06-28 DIAGNOSIS — Z8701 Personal history of pneumonia (recurrent): Secondary | ICD-10-CM

## 2013-06-28 NOTE — Progress Notes (Signed)
   Subjective:    Patient ID: [name], female    DOB: 12-24-1965, 48 y.o.   MRN: [id number]  HPI Patient was in the hospital the first part on May with pneumonia- SHe was seen for follow up 05/27/13 and was doing much better. SHe is here today for repeat chest x ray to check for full resolution. Denies SOB, she coughs every now and then.     Review of Systems  Constitutional: Negative.   HENT: Negative.   Eyes: Negative.   Respiratory: Negative.  Negative for chest tightness, shortness of breath and wheezing.   Endocrine: Negative.   Neurological: Negative.        Objective:   Physical Exam  Constitutional: She is oriented to person, place, and time. She appears well-developed.  HENT:  Head: Normocephalic.  Eyes: Pupils are equal, round, and reactive to light.  Neck: Normal range of motion.  Cardiovascular: Normal rate and regular rhythm.   Pulmonary/Chest: Effort normal and breath sounds normal.  Musculoskeletal: Normal range of motion.  Neurological: She is alert and oriented to person, place, and time.         BP 110/80  Pulse 83  Temp(Src) 98.1 F (36.7 C) (Oral)  Ht 5\' 1"  (1.549 m)  Wt 174 lb (78.926 kg)  BMI 32.89 kg/m2  .chest xray- resolved pneumonia-Preliminary reading by Ronnald Collum, FNP  [facility]    Assessment & Plan:  1. H/O: pneumonia  - DG Chest 2 View;  RTO prn  [name], FNP

## 2013-07-26 ENCOUNTER — Other Ambulatory Visit: Payer: Self-pay | Admitting: Nurse Practitioner

## 2013-07-26 ENCOUNTER — Other Ambulatory Visit: Payer: Self-pay | Admitting: *Deleted

## 2013-07-26 ENCOUNTER — Telehealth: Payer: Self-pay | Admitting: Nurse Practitioner

## 2013-07-26 MED ORDER — FLUOXETINE HCL 20 MG PO CAPS: 40.0000 mg | ORAL_CAPSULE | Freq: Every day | ORAL | Status: DC

## 2013-07-26 NOTE — Telephone Encounter (Signed)
I sent the Rx to the pharmacy.

## 2013-08-01 ENCOUNTER — Telehealth: Payer: Self-pay | Admitting: Nurse Practitioner

## 2013-08-01 NOTE — Telephone Encounter (Signed)
appt scheduled for wed with mmm

## 2013-08-03 ENCOUNTER — Encounter: Payer: Self-pay | Admitting: Nurse Practitioner

## 2013-08-03 ENCOUNTER — Ambulatory Visit (INDEPENDENT_AMBULATORY_CARE_PROVIDER_SITE_OTHER): Payer: Medicare Other | Admitting: Nurse Practitioner

## 2013-08-03 VITALS — BP 106/70 | HR 97 | Temp 98.6°F | Ht 61.0 in | Wt 172.4 lb

## 2013-08-03 DIAGNOSIS — M069 Rheumatoid arthritis, unspecified: Secondary | ICD-10-CM

## 2013-08-03 DIAGNOSIS — M25569 Pain in unspecified knee: Secondary | ICD-10-CM

## 2013-08-03 MED ORDER — METHYLPREDNISOLONE ACETATE 40 MG/ML IJ SUSP
40.0000 mg | Freq: Once | INTRAMUSCULAR | Status: AC
  Administered 2013-08-03: 40 mg via INTRA_ARTICULAR

## 2013-08-03 MED ORDER — BUPIVACAINE HCL 0.25 % IJ SOLN
1.0000 mL | Freq: Once | INTRAMUSCULAR | Status: AC
  Administered 2013-08-03: 1 mL via INTRA_ARTICULAR

## 2013-08-03 NOTE — Progress Notes (Signed)
   Subjective:    Patient ID: [name], female    DOB: 1965/01/18, 48 y.o.   MRN: [id number]  Arthritis Presents for follow-up visit. The disease course has been fluctuating. She complains of pain and joint swelling. Affected locations include the left knee. Her pain is at a severity of 7/10. Her past medical history is significant for rheumatoid arthritis. Past treatments include nothing. The treatment provided no relief. Factors aggravating her arthritis include climbing stairs, descending stairs and activity. Compliance with prior treatments has been good.   * SH eusually sees Dr. Sharion Dove and he cannot see her for 3 months- He wants her to have a total knee replacement but they are unable to do due to her seizures   Review of Systems  Constitutional: Negative.   HENT: Negative.   Respiratory: Negative.   Cardiovascular: Negative.   Genitourinary: Negative.   Musculoskeletal: Positive for arthritis and joint swelling.  All other systems reviewed and are negative.      Objective:   Physical Exam  Constitutional: She appears well-developed and well-nourished.  Cardiovascular: Normal rate, regular rhythm and normal heart sounds.   Pulmonary/Chest: Effort normal and breath sounds normal.  Musculoskeletal:  FROM of left knee with crepitus on flex and ext All ligaments intact No edema  Skin: Skin is warm and dry.   BP 106/70  Pulse 97  Temp(Src) 98.6 F (37 C) (Oral)  Ht 5\' 1"  (1.549 m)  Wt 172 lb 6.4 oz (78.2 kg)  BMI 32.59 kg/m2  Injection of left knee joint under sterile conditions       Assessment & Plan:   1. Rheumatoid arthritis of knee    Meds ordered this encounter  Medications  . bupivacaine (MARCAINE) 0.25 % (with pres) injection 1 mL    Sig:   . methylPREDNISolone acetate (DEPO-MEDROL) injection 40 mg    Sig:    Ice knee Rest RTO prn  [name], FNP

## 2013-08-03 NOTE — Patient Instructions (Signed)
Joint Injection  Care After  Refer to this sheet in the next few days. These instructions provide you with information on caring for yourself after you have had a joint injection. Your caregiver also may give you more specific instructions. Your treatment has been planned according to current medical practices, but problems sometimes occur. Call your caregiver if you have any problems or questions after your procedure.  After any type of joint injection, it is not uncommon to experience:  · Soreness, swelling, or bruising around the injection site.  · Mild numbness, tingling, or weakness around the injection site caused by the numbing medicine used before or with the injection.  It also is possible to experience the following effects associated with the specific agent after injection:  · Iodine-based contrast agents:  ¨ Allergic reaction (itching, hives, widespread redness, and swelling beyond the injection site).  · Corticosteroids (These effects are rare.):  ¨ Allergic reaction.  ¨ Increased blood sugar levels (If you have diabetes and you notice that your blood sugar levels have increased, notify your caregiver).  ¨ Increased blood pressure levels.  ¨ Mood swings.  · Hyaluronic acid in the use of viscosupplementation.  ¨ Temporary heat or redness.  ¨ Temporary rash and itching.  ¨ Increased fluid accumulation in the injected joint.  These effects all should resolve within a day after your procedure.   HOME CARE INSTRUCTIONS  · Limit yourself to light activity the day of your procedure. Avoid lifting heavy objects, bending, stooping, or twisting.  · Take prescription or over-the-counter pain medication as directed by your caregiver.  · You may apply ice to your injection site to reduce pain and swelling the day of your procedure. Ice may be applied 03-04 times:  ¨ Put ice in a plastic bag.  ¨ Place a towel between your skin and the bag.  ¨ Leave the ice on for no longer than 15-20 minutes each time.  SEEK  IMMEDIATE MEDICAL CARE IF:   · Pain and swelling get worse rather than better or extend beyond the injection site.  · Numbness does not go away.  · Blood or fluid continues to leak from the injection site.  · You have chest pain.  · You have swelling of your face or tongue.  · You have trouble breathing or you become dizzy.  · You develop a fever, chills, or severe tenderness at the injection site that last longer than 1 day.  MAKE SURE YOU:  · Understand these instructions.  · Watch your condition.  · Get help right away if you are not doing well or if you get worse.  Document Released: 09/12/2010 Document Revised: 03/24/2011 Document Reviewed: 09/12/2010  ExitCare® Patient Information ©2015 [employer]. This information is not intended to replace advice given to you by your health care provider. Make sure you discuss any questions you have with your health care provider.

## 2013-08-06 ENCOUNTER — Other Ambulatory Visit: Payer: Self-pay | Admitting: Nurse Practitioner

## 2013-08-08 ENCOUNTER — Telehealth: Payer: Self-pay | Admitting: Nurse Practitioner

## 2013-08-08 MED ORDER — CLONAZEPAM 0.5 MG PO TABS: 0.5000 mg | ORAL_TABLET | Freq: Three times a day (TID) | ORAL | Status: DC

## 2013-08-08 NOTE — Telephone Encounter (Signed)
Please call in klonopin 0.5 mg TID #90 with 1 refills- make sure patient knows that she cannot get this from [name] and neurologist.

## 2013-08-08 NOTE — Telephone Encounter (Signed)
Called in.

## 2013-08-11 ENCOUNTER — Ambulatory Visit (INDEPENDENT_AMBULATORY_CARE_PROVIDER_SITE_OTHER): Payer: Medicare Other | Admitting: Nurse Practitioner

## 2013-08-11 VITALS — BP 108/79 | HR 78 | Temp 97.6°F | Ht 61.0 in | Wt 170.0 lb

## 2013-08-11 DIAGNOSIS — W57XXXA Bitten or stung by nonvenomous insect and other nonvenomous arthropods, initial encounter: Secondary | ICD-10-CM

## 2013-08-11 DIAGNOSIS — T148 Other injury of unspecified body region: Secondary | ICD-10-CM

## 2013-08-11 NOTE — Patient Instructions (Signed)

## 2013-08-11 NOTE — Progress Notes (Signed)
   Subjective:    Patient ID: [name], female    DOB: Aug 26, 1965, 48 y.o.   MRN: [id number]  HPI Had a seizure on Sunday and was outside and feel into a grassy area.  Has red raised bumps on back of neck, back, and chest with itching,  Has used cortisone cream, epsom salts with no relief.    Review of Systems  Respiratory: Negative.   Cardiovascular: Negative.   Skin: Positive for rash.       Objective:   Physical Exam  Vitals reviewed. Constitutional: She appears well-developed.  Cardiovascular: Normal rate, regular rhythm and normal heart sounds.   Pulmonary/Chest: Effort normal and breath sounds normal.  Skin: Skin is warm and dry.  Red raised bumps located on upper neck at hair line, thoracic and lumbar area with no drainage.     BP 108/79  Pulse 78  Temp(Src) 97.6 F (36.4 C) (Oral)  Ht 5\' 1"  (1.549 m)  Wt 170 lb (77.111 kg)  BMI 32.14 kg/m2        Assessment & Plan:   1. Bug bites    benadryl pills OTC Avoid itching RTO prn  [name], FNP

## 2013-08-17 ENCOUNTER — Ambulatory Visit (INDEPENDENT_AMBULATORY_CARE_PROVIDER_SITE_OTHER): Payer: Medicare Other | Admitting: Neurology

## 2013-08-17 ENCOUNTER — Encounter: Payer: Self-pay | Admitting: Neurology

## 2013-08-17 ENCOUNTER — Other Ambulatory Visit: Payer: Self-pay | Admitting: Neurology

## 2013-08-17 VITALS — BP 112/83 | HR 85 | Ht 62.25 in | Wt 174.0 lb

## 2013-08-17 DIAGNOSIS — G40802 Other epilepsy, not intractable, without status epilepticus: Secondary | ICD-10-CM

## 2013-08-17 DIAGNOSIS — R569 Unspecified convulsions: Secondary | ICD-10-CM

## 2013-08-17 DIAGNOSIS — F445 Conversion disorder with seizures or convulsions: Secondary | ICD-10-CM | POA: Insufficient documentation

## 2013-08-17 MED ORDER — LAMOTRIGINE 100 MG PO TABS: 100.0000 mg | ORAL_TABLET | Freq: Two times a day (BID) | ORAL | Status: DC

## 2013-08-17 MED ORDER — LEVETIRACETAM 500 MG PO TABS: 500.0000 mg | ORAL_TABLET | Freq: Two times a day (BID) | ORAL | Status: DC

## 2013-08-17 MED ORDER — GABAPENTIN 600 MG PO TABS: ORAL_TABLET | ORAL | Status: DC

## 2013-08-17 NOTE — Addendum Note (Signed)
Addended by: [name] on: 08/17/2013 03:16 PM   Modules accepted: Orders

## 2013-08-17 NOTE — Progress Notes (Signed)
PATIENT: [name] DOB: 02/16/1965   REASON FOR VISIT: follow up for seizures and other convulsions HISTORY FROM: patient  HISTORY OF PRESENT ILLNESS: [name] is a 48 y.o. female here as a referral from Dr. Laurance Flatten for spells. She was last seen by Dr. Erling Cruz on 01/12/2012.  The patient's chief complaint : she has a history of new onset generalized tonic-clonic seizures beginning 10-12-2000, accompanied by an abnormal EEG showing generalized sharp wave discharges at 3 Hz. This is consistent with a primary generalized seizure disorder. She was initially treated with Dilantin and then changed to Depakote ER.  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  She would have as many as 7 seizures or spells with generalized jerking , unassociated with tongue bite or incontinence.  Because of side effects , a change topiramate was initiated and tolerated. She was referred to Dr. Elisabeth Cara at [facility] documented nonepileptic seizures.  A 24 hour prolonged EEG monitoring and 2004 showed that the patient had both, pseudoseizures and seizures with generalized epilepsy.  In August 2012 she had a repeated seizure activity during her dentist office visit. Blood studies, MRI and CT were normal EEG shorted and spike and wave complexes she was discharged on November Dr. Erling Cruz has recorded that she has to spells per month on average she is followed by [facility] Marliss [facility]. And takes Prozac. Her liver function tests were often elevated 11/22/2011 her AST was 65 and ALT 85 her last ambulatory EEG September 2013 showed abundant generalized 3-5 Hz spikes and polyspike waves. UPDATE 02/02/13 (LL):  [name] returns to our office for follow up of hospital visit for seizure.  She had hospital admission at [facility] on 12/14/12, after having seizure in pre-op there for foot surgery.  The foot surgery was rescheduled.  Her husband reports that she  has an average of 1-4 seizures per month.  She reports that she did the best in the past on Keppra and Lamictal, but Keppra was too expensive.  She has not been on generic Keppra to her knowledge.  She reports last seeing psychiatrist on December 12.  She asks to have her Klonopin refilled.  Records reviewed from [facility], last seen by Dr. Inocente Salles in June 2014, where Dr. Inocente Salles stated that she will only be seen prn there,    Interval history : the patient presents today on 08-17-13 for a follow-up,  having had another seizure about 14 days ago. With a [employer] number and became alarmed when the call went  through and handled her seizure treatment from there. The patient apparently seized on a hot day , at 4 Pm . She recalled that the "race " was on TV. That was just  after learning that her husband has an only 2 year life expectancy , host vs.graft reaction. She was overwhelmed. " it was just the other kind, a stress seizure "'she advised.  She denies having forgotten any medication doses.      UPDATE 02/02/13 (LL):  [name] returns to our office for follow up of hospital visit for seizure.  She had hospital admission at [facility] on 12/14/12, after having seizure in pre-op there for foot surgery.  The foot surgery was rescheduled.  Her husband reports that she has an average of 1-4 seizures per month.  She reports that she did the best in the past on Keppra and Lamictal, but Keppra  was too expensive.  She has not been on generic Keppra to her knowledge.  She reports last seeing psychiatrist on December 12.  She asks to have her Klonopin refilled.  Records reviewed from [facility], last seen by Dr. Inocente Salles in June 2014, where Dr. Inocente Salles stated that she will only be seen prn there, patient chooses to be followed by [facility]  for seizures.  REVIEW OF SYSTEMS: Full 14 system review of systems performed and notable only for:   She had a lot of insect bites. Itching   Constitutional:  fatigue Respiratory: cough, non productive.  Gastroitestinal: constipation  Genitourinary: difficulty urinating  Neurological: memory loss, dizziness, headache, seizure, tremors, passing out Psychiatric: confusion, depression Sleep: restless legs  ALLERGIES: Allergies  Allergen Reactions  . Vimpat [Lacosamide] Other (See Comments)    Severe dizziness  . Benadryl [Diphenhydramine Hcl] Other (See Comments)    seizures  . Dilantin [Phenytoin Sodium Extended] Other (See Comments)    Elevated LFT's  . Melatonin Other (See Comments)    Unknown  . Ultram [Tramadol Hcl] Other (See Comments)    Seizures  . Vicodin [Hydrocodone-Acetaminophen] Itching  . Betadine [Povidone Iodine] Rash  . Codeine Itching and Rash    seizures  . Latex Rash  . Penicillins Itching and Rash  . Tape Rash    Paper tape please    HOME MEDICATIONS: Outpatient Prescriptions Prior to Visit  Medication Sig Dispense Refill  . clonazePAM (KLONOPIN) 0.5 MG tablet Take 1 tablet (0.5 mg total) by mouth 3 (three) times daily.  90 tablet  1  . diclofenac sodium (VOLTAREN) 1 % GEL Apply 1 application topically daily as needed (foot pain).      Marland Kitchen FLUoxetine (PROZAC) 20 MG capsule Take 2 capsules (40 mg total) by mouth daily.  60 capsule  3  . gabapentin (NEURONTIN) 600 MG tablet Take 600-1,200 mg by mouth 4 (four) times daily. Take 1 capsule (600 mg) 3 times daily and take 2 capsules (1200 mg) at bedtime      . Iron-Vitamins (GERITOL PO) Take 1 tablet by mouth daily.      Marland Kitchen lamoTRIgine (LAMICTAL) 100 MG tablet Take 1 tablet (100 mg total) by mouth 2 (two) times daily.  60 tablet  5  . levETIRAcetam (KEPPRA) 500 MG tablet Take 1 tablet (500 mg total) by mouth 2 (two) times daily.  60 tablet  6  . omega-3 acid ethyl esters (LOVAZA) 1 G capsule Take 2 capsules (2 g total) by mouth 2 (two) times daily.  120 capsule  11  . pramipexole (MIRAPEX) 0.125 MG tablet Take 0.25 mg by mouth at bedtime.       . vitamin B-12  (CYANOCOBALAMIN) 1000 MCG tablet Take 1,000 mcg by mouth daily.       No facility-administered medications prior to visit.    PHYSICAL EXAM  Filed Vitals:   08/17/13 1443  BP: 112/83  Pulse: 85  Height: 5' 2.25" (1.581 m)  Weight: 174 lb (78.926 kg)   Body mass index is 31.58 kg/(m^2).  Generalized: Well developed, in no acute distress, overweight Caucasian female  Head: normocephalic and atraumatic. Oropharynx benign, Mallampati 3 , nasal air flow restricted, and no retrognathia.   Neck: Supple, no carotid bruits  Cardiac: Regular rate rhythm, no murmur  Musculoskeletal: No deformity   Neurological examination  Mentation: Alert oriented to time, place, history taking.  Follows all commands. Not very eloquent. Speech is fluent with a lisp-  dysarthria,  No dysphonia or aphasia.  Mood and affect are nervous. Cranial nerve :   Pupils were equal round, minimally reactive to light,  extraocular movements were  With coarse saccades, no smooth pursuit in the horizontal plane. Smooth pursuit in vertical plane.   Her visual field were full on confrontational test.  Facial sensation and strength were normal.  Uvula tongue midline. head turning and shoulder shrug and were normal and symmetric.  Motor: Hip ROM restricted since birth - increased Tone in the left leg, and normal muscle bulk and symmetric normal strength in upper extremities.  Sensory: normal and symmetric to light touch, pinprick, and  vibration  Coordination: finger-nose-finger, heel-to-shin bilaterally, no dysmetria, ataxia or tremor.  Reflexes:  Deep tendon reflexes in the upper and lower extremities are present and symmetric.  Gait and Station: Rising up from seated position without assistance, without bracing . Her  wide based gait and stance  Is noted - no drift noted, with Gait ataxia.  Tandem is unsteady, Romberg negative.  D  Lab Results  Component Value Date   WBC 7.5 05/09/2013   HGB 11.1* 05/09/2013   HCT  33.4* 05/09/2013   MCV 90.0 05/09/2013   PLT 239 05/09/2013      Component Value Date/Time   NA 137 05/09/2013 0445   NA 141 11/08/2012 1533   K 3.4* 05/09/2013 0445   CL 101 05/09/2013 0445   CO2 23 05/09/2013 0445   GLUCOSE 84 05/09/2013 0445   GLUCOSE 84 11/08/2012 1533   BUN 5* 05/09/2013 0445   BUN 6 11/08/2012 1533   CREATININE 0.65 05/09/2013 0445   CALCIUM 8.2* 05/09/2013 0445   PROT 6.2 05/09/2013 0445   PROT 6.3 11/08/2012 1533   ALBUMIN 3.1* 05/09/2013 0445   AST 23 05/09/2013 0445   ALT 26 05/09/2013 0445   ALKPHOS 61 05/09/2013 0445   BILITOT 0.3 05/09/2013 0445   GFRNONAA >90 05/09/2013 0445   GFRAA >90 05/09/2013 0445    ASSESSMENT AND PLAN 48 y.o. year old female  has a past medical history of Vertigo; Constipation; Restless leg; High cholesterol; Chronic bronchitis; Headache(784.0); Depression; Epilepsy; Seizures; Arthritis; Osteoarthritis; Colon polyps; Rectal bleed; and Other convulsions (05/21/12). here for follow up of epilepsy and other convulsions.  PLAN: 1. Continue Lamotrigine (Lamictal) at current dose. 2. Start Levetiracetam (Keppra) 500 mg, 1 tablet every 12 hours.   Side effects to watch for are worsening depression, suicidal feelings.  Please report these feelings to our office right away or go to the ER. 3. Continue regular follow up with your Psychiatrist.   She is advised that she needs to have her Klonopin refilled through Psychiatry.  Chart records say that Dr Janann Colonel already phoned in a Rx for this patient on 01/02. She received prozac through psychiatry.  Klonopin  through PCP,  Ativan was supposingly prescribed by [name].   Refill request denied. 3. Follow up with NP CM  in 6 months.   Meds ordered this encounter  Medications  . lamoTRIgine (LAMICTAL) 100 MG tablet    Sig: Take 1 tablet (100 mg total) by mouth 2 (two) times daily.    Dispense:  60 tablet    Refill:  5    Order Specific Question:  Supervising Provider    Answer:  [name],  [name] [[id number]]  . levETIRAcetam (KEPPRA) 500 MG tablet    Sig: Take 1 tablet (500 mg total) by mouth 2 (two) times daily.    Dispense:  60 tablet    Refill:  2  Order Specific Question:  Supervising Provider    Answer:  [name] [[id number]]   HGDJME Monica Bieker MD   08/17/2013, 2:51 PM [facility] [address], [place], [place] [address] [phone]  Note: This document was prepared with digital dictation and possible smart phrase technology. Any transcriptional errors that result from this process are unintentional.

## 2013-08-17 NOTE — Patient Instructions (Signed)
Epilepsy °People with epilepsy have times when they shake and jerk uncontrollably (seizures). This happens when there is a sudden change in brain function. Epilepsy may have many possible causes. Anything that disturbs the normal pattern of brain cell activity can lead to seizures. °HOME CARE  °· Follow your doctor's instructions about driving and safety during normal activities. °· Get enough sleep. °· Only take medicine as told by your doctor. °· Avoid things that you know can cause you to have seizures (triggers). °· Write down when your seizures happen and what you remember about each seizure. Write down anything you think may have caused the seizure to happen. °· Tell the people you live and work with that you have seizures. Make sure they know how to help you. They should: °¨ Cushion your head and body. °¨ Turn you on your side. °¨ Not restrain you. °¨ Not place anything inside your mouth. °¨ Call for local emergency medical help if there is any question about what has happened. °· Keep all follow-up visits with your doctor. This is very important. °GET HELP IF: °· You get an infection or start to feel sick. You may have more seizures when you are sick. °· You are having seizures more often. °· Your seizure pattern is changing. °GET HELP RIGHT AWAY IF:  °· A seizure does not stop after a few seconds or minutes. °· A seizure causes you to have trouble breathing. °· A seizure gives you a very bad headache. °· A seizure makes you unable to speak or use a part of your body. °Document Released: 10/27/2008 Document Revised: 10/20/2012 Document Reviewed: 08/11/2012 °ExitCare® Patient Information ©2015 [employer]. This information is not intended to replace advice given to you by your health care provider. Make sure you discuss any questions you have with your health care provider. ° °

## 2013-09-08 ENCOUNTER — Ambulatory Visit (INDEPENDENT_AMBULATORY_CARE_PROVIDER_SITE_OTHER): Payer: Medicare Other | Admitting: Family Medicine

## 2013-09-08 ENCOUNTER — Encounter: Payer: Self-pay | Admitting: Family Medicine

## 2013-09-08 VITALS — BP 105/74 | HR 77 | Temp 97.7°F | Ht 61.0 in | Wt 175.0 lb

## 2013-09-08 DIAGNOSIS — R739 Hyperglycemia, unspecified: Secondary | ICD-10-CM

## 2013-09-08 DIAGNOSIS — J069 Acute upper respiratory infection, unspecified: Secondary | ICD-10-CM

## 2013-09-08 DIAGNOSIS — R7309 Other abnormal glucose: Secondary | ICD-10-CM

## 2013-09-08 LAB — POCT GLYCOSYLATED HEMOGLOBIN (HGB A1C): Hemoglobin A1C: 5.4

## 2013-09-08 LAB — GLUCOSE, POCT (MANUAL RESULT ENTRY): POC Glucose: 103 mg/dl — AB (ref 70–99)

## 2013-09-08 MED ORDER — BENZONATATE 100 MG PO CAPS: 100.0000 mg | ORAL_CAPSULE | Freq: Three times a day (TID) | ORAL | Status: DC | PRN

## 2013-09-08 MED ORDER — AZITHROMYCIN 250 MG PO TABS: ORAL_TABLET | ORAL | Status: DC

## 2013-09-08 NOTE — Progress Notes (Signed)
   Subjective:    Patient ID: [name], female    DOB: 04-18-1965, 48 y.o.   MRN: [id number]  HPI This 48 y.o. female presents for evaluation of URI complaints.  She is having persistent cough and congestion.  She reports she took her fsbs the other day and her blood sugar was in the 300's.   Review of Systems No chest pain, SOB, HA, dizziness, vision change, N/V, diarrhea, constipation, dysuria, urinary urgency or frequency, myalgias, arthralgias or rash.     Objective:   Physical Exam  Vital signs noted  Well developed well nourished female.  HEENT - Head atraumatic Normocephalic                Eyes - PERRLA, Conjuctiva - clear Sclera- Clear EOMI                Ears - EAC's Wnl TM's Wnl Gross Hearing WNL                Nose - Nares patent                 Throat - oropharanx wnl Respiratory - Lungs CTA bilateral Cardiac - RRR S1 and S2 without murmur GI - Abdomen soft Nontender and bowel sounds active x 4 Extremities - No edema. Neuro - Grossly intact.      Assessment & Plan:  URI (upper respiratory infection) - Plan: azithromycin (ZITHROMAX) 250 MG tablet, benzonatate (TESSALON PERLES) 100 MG capsule  Hyperglycemia - Plan: POCT glucose (manual entry), POCT glycosylated hemoglobin (Hb A1C) FSBS - 103 and hgbaic is 5.4% and she is not diabetic and patient is reassured.  Lysbeth Penner FNP

## 2013-09-26 ENCOUNTER — Telehealth: Payer: Self-pay | Admitting: Nurse Practitioner

## 2013-09-26 MED ORDER — PRAMIPEXOLE DIHYDROCHLORIDE 0.125 MG PO TABS: 0.2500 mg | ORAL_TABLET | Freq: Every day | ORAL | Status: DC

## 2013-09-26 NOTE — Telephone Encounter (Signed)
done

## 2013-10-10 ENCOUNTER — Encounter: Payer: Self-pay | Admitting: Internal Medicine

## 2013-10-10 ENCOUNTER — Ambulatory Visit: Payer: Medicare Other | Admitting: Family Medicine

## 2013-10-10 ENCOUNTER — Encounter: Payer: Self-pay | Admitting: Family

## 2013-10-10 ENCOUNTER — Ambulatory Visit (INDEPENDENT_AMBULATORY_CARE_PROVIDER_SITE_OTHER): Payer: Medicare Other | Admitting: Family

## 2013-10-10 VITALS — BP 111/75 | HR 83 | Temp 97.5°F | Ht 61.0 in | Wt 178.0 lb

## 2013-10-10 DIAGNOSIS — J069 Acute upper respiratory infection, unspecified: Secondary | ICD-10-CM

## 2013-10-10 MED ORDER — BENZONATATE 200 MG PO CAPS: 200.0000 mg | ORAL_CAPSULE | Freq: Three times a day (TID) | ORAL | Status: DC | PRN

## 2013-10-10 NOTE — Patient Instructions (Addendum)
Upper Respiratory Infection, Adult An upper respiratory infection (URI) is also known as the common cold. It is often caused by a type of germ (virus). Colds are easily spread (contagious). You can pass it to others by kissing, coughing, sneezing, or drinking out of the same glass. Usually, you get better in 1 or 2 weeks.  HOME CARE   Only take medicine as told by your doctor.  Use a warm mist humidifier or breathe in steam from a hot shower.  Drink enough water and fluids to keep your pee (urine) clear or pale yellow.  Get plenty of rest.  Return to work when your temperature is back to normal or as told by your doctor. You may use a face mask and wash your hands to stop your cold from spreading. GET HELP RIGHT AWAY IF:   After the first few days, you feel you are getting worse.  You have questions about your medicine.  You have chills, shortness of breath, or brown or red spit (mucus).  You have yellow or brown snot (nasal discharge) or pain in the face, especially when you bend forward.  You have a fever, puffy (swollen) neck, pain when you swallow, or white spots in the back of your throat.  You have a bad headache, ear pain, sinus pain, or chest pain.  You have a high-pitched whistling sound when you breathe in and out (wheezing).  You have a lasting cough or cough up blood.  You have sore muscles or a stiff neck. MAKE SURE YOU:   Understand these instructions.  Will watch your condition.  Will get help right away if you are not doing well or get worse. Document Released: 06/18/2007 Document Revised: 03/24/2011 Document Reviewed: 04/06/2013 [facility] Patient Information 2015 [place], [place]. This information is not intended to replace advice given to you by your health care provider. Make sure you discuss any questions you have with your health care provider.  - Take meds as prescribed - Use a cool mist humidifier  -Use saline nose sprays frequently -Saline  irrigations of the nose can be very helpful if done frequently.  * 4X daily for 1 week*  * Use of a nettie pot can be helpful with this. Follow directions with this* -Force fluids -For any cough or congestion  Use plain Mucinex- regular strength or max strength is fine   * Children- consult with Pharmacist for dosing -For fever or aces or pains- take tylenol or ibuprofen appropriate for age and weight.  * for fevers greater than 101 orally you may alternate ibuprofen and tylenol every  3 hours. -Throat lozenges if help   Evelina Dun, FNP

## 2013-10-10 NOTE — Progress Notes (Signed)
   Subjective:    Patient ID: [name], female    DOB: 28-Apr-1965, 48 y.o.   MRN: [id number]  HPI Pt presents to the office for sharp pain when she takes deep breaths. Pt states she had pneumonia back in April and just wants to makes sure she's not getting pneumonia again. Pt states she has been coughing "not much" and had a "watery eye", but denies and fever or productive cough.    Review of Systems  Constitutional: Negative.   HENT: Negative.   Eyes: Negative.   Respiratory: Negative.  Negative for shortness of breath.   Cardiovascular: Negative.  Negative for palpitations.  Gastrointestinal: Negative.   Endocrine: Negative.   Genitourinary: Negative.   Musculoskeletal: Negative.   Neurological: Negative.  Negative for headaches.  Hematological: Negative.   Psychiatric/Behavioral: Negative.   All other systems reviewed and are negative.      Objective:   Physical Exam  Vitals reviewed. Constitutional: She is oriented to person, place, and time. She appears well-developed and well-nourished. No distress.  HENT:  Head: Normocephalic and atraumatic.  Right Ear: External ear normal.  Left Ear: External ear normal.  Mouth/Throat: Oropharynx is clear and moist.  Nasal erythemas with mild swelling   Eyes: Pupils are equal, round, and reactive to light.  Neck: Normal range of motion. Neck supple. No thyromegaly present.  Cardiovascular: Normal rate, regular rhythm, normal heart sounds and intact distal pulses.   No murmur heard. Pulmonary/Chest: Effort normal and breath sounds normal. No respiratory distress. She has no wheezes.  Abdominal: Soft. Bowel sounds are normal. She exhibits no distension. There is no tenderness.  Musculoskeletal: Normal range of motion. She exhibits no edema and no tenderness.  Neurological: She is alert and oriented to person, place, and time. She has normal reflexes. No cranial nerve deficit.  Skin: Skin is warm and dry.  Psychiatric: She has a  normal mood and affect. Her behavior is normal. Judgment and thought content normal.    BP 111/75  Pulse 83  Temp(Src) 97.5 F (36.4 C) (Oral)  Ht 5\' 1"  (1.549 m)  Wt 178 lb (80.74 kg)  BMI 33.65 kg/m2       Assessment & Plan:  1. URI (upper respiratory infection) -- Take meds as prescribed - Use a cool mist humidifier  -Use saline nose sprays frequently -Saline irrigations of the nose can be very helpful if done frequently.  * 4X daily for 1 week*  * Use of a nettie pot can be helpful with this. Follow directions with this* -Force fluids -For any cough or congestion  Use plain Mucinex- regular strength or max strength is fine   * Children- consult with Pharmacist for dosing -For fever or aces or pains- take tylenol or ibuprofen appropriate for age and weight.  * for fevers greater than 101 orally you may alternate ibuprofen and tylenol every  3 hours. -Throat lozenges if help - benzonatate (TESSALON) 200 MG capsule; Take 1 capsule (200 mg total) by mouth 3 (three) times daily as needed.  Dispense: 30 capsule; Refill: Ring[place], FNP

## 2013-10-26 ENCOUNTER — Ambulatory Visit (INDEPENDENT_AMBULATORY_CARE_PROVIDER_SITE_OTHER): Payer: Medicare Other | Admitting: Family Medicine

## 2013-10-26 VITALS — BP 129/86 | HR 96 | Temp 98.1°F | Ht 61.0 in | Wt 179.6 lb

## 2013-10-26 DIAGNOSIS — T7840XA Allergy, unspecified, initial encounter: Secondary | ICD-10-CM

## 2013-10-26 MED ORDER — METHYLPREDNISOLONE SODIUM SUCC 125 MG IJ SOLR
125.0000 mg | Freq: Once | INTRAMUSCULAR | Status: AC
  Administered 2013-10-26: 125 mg via INTRAMUSCULAR

## 2013-10-26 MED ORDER — DIPHENHYDRAMINE HCL 25 MG PO CAPS
50.0000 mg | ORAL_CAPSULE | Freq: Once | ORAL | Status: AC
  Administered 2013-10-26: 50 mg via ORAL

## 2013-10-26 MED ORDER — METHYLPREDNISOLONE (PAK) 4 MG PO TABS: ORAL_TABLET | ORAL | Status: DC

## 2013-10-26 NOTE — Progress Notes (Signed)
   Subjective:    Patient ID: [name], female    DOB: 11-25-1965, 48 y.o.   MRN: [id number]  HPI This 48 y.o. female presents for evaluation of allergic rxn to flu shot.  She was at the pharmacy and had a flu shot and now she has a local reaction and rash over her back.  She denies SOB or swelling of face or tongue.   Review of Systems C/o allergic reaction No chest pain, SOB, HA, dizziness, vision change, N/V, diarrhea, constipation, dysuria, urinary urgency or frequency, myalgias, arthralgias or rash.     Objective:   Physical Exam  Vital signs noted  Well developed well nourished female.  HEENT - Head atraumatic Normocephalic                Eyes - PERRLA, Conjuctiva - clear Sclera- Clear EOMI                Ears - EAC's Wnl TM's Wnl Gross Hearing WNL                Nose - Nares patent                 Throat - oropharanx wnl Respiratory - Lungs CTA bilateral Cardiac - RRR S1 and S2 without murmur GI - Abdomen soft Nontender and bowel sounds active x 4 Extremities - No edema. Neuro - Grossly intact. Skin - Left arm with erythema and swelling approx 6cm diameter and rash over bilateral  Back.     Assessment & Plan:  Allergic reaction, initial encounter - Plan: diphenhydrAMINE (BENADRYL) capsule 50 mg, methylPREDNISolone sodium succinate (SOLU-MEDROL) 125 mg/2 mL injection 125 mg, methylPREDNIsolone (MEDROL DOSPACK) 4 MG tablet Husband states she cannot take benadryl because of sz's. Patient given a depo shot and medrol dose pack and waits 30 minutes in clinic w/o increased or worsening sx's.

## 2013-11-03 ENCOUNTER — Other Ambulatory Visit: Payer: Self-pay | Admitting: Nurse Practitioner

## 2013-11-04 NOTE — Telephone Encounter (Signed)
Last seen 10/26/13  [facility] [place] If approved route to nurse to call into [facility]

## 2013-11-08 ENCOUNTER — Telehealth: Payer: Self-pay | Admitting: Family Medicine

## 2013-11-08 NOTE — Telephone Encounter (Signed)
Pt aware already called in

## 2013-11-12 ENCOUNTER — Ambulatory Visit (INDEPENDENT_AMBULATORY_CARE_PROVIDER_SITE_OTHER): Payer: Medicare Other | Admitting: General Practice

## 2013-11-12 ENCOUNTER — Emergency Department (HOSPITAL_COMMUNITY): Payer: Medicare Other

## 2013-11-12 ENCOUNTER — Emergency Department (HOSPITAL_COMMUNITY)
Admission: EM | Admit: 2013-11-12 | Discharge: 2013-11-12 | Disposition: A | Discharge status: Home / Self Care | Payer: Medicare Other | Attending: Emergency Medicine | Admitting: Emergency Medicine

## 2013-11-12 ENCOUNTER — Encounter (HOSPITAL_COMMUNITY): Payer: Self-pay | Admitting: Emergency Medicine

## 2013-11-12 VITALS — BP 114/72 | HR 107 | Temp 103.6°F | Ht 61.0 in

## 2013-11-12 DIAGNOSIS — J209 Acute bronchitis, unspecified: Secondary | ICD-10-CM | POA: Diagnosis not present

## 2013-11-12 DIAGNOSIS — Z86010 Personal history of colonic polyps: Secondary | ICD-10-CM | POA: Diagnosis not present

## 2013-11-12 DIAGNOSIS — Z79899 Other long term (current) drug therapy: Secondary | ICD-10-CM | POA: Diagnosis not present

## 2013-11-12 DIAGNOSIS — E780 Pure hypercholesterolemia: Secondary | ICD-10-CM | POA: Diagnosis not present

## 2013-11-12 DIAGNOSIS — G40909 Epilepsy, unspecified, not intractable, without status epilepticus: Secondary | ICD-10-CM | POA: Insufficient documentation

## 2013-11-12 DIAGNOSIS — R509 Fever, unspecified: Secondary | ICD-10-CM | POA: Insufficient documentation

## 2013-11-12 DIAGNOSIS — R569 Unspecified convulsions: Secondary | ICD-10-CM

## 2013-11-12 DIAGNOSIS — M1990 Unspecified osteoarthritis, unspecified site: Secondary | ICD-10-CM | POA: Diagnosis not present

## 2013-11-12 DIAGNOSIS — F329 Major depressive disorder, single episode, unspecified: Secondary | ICD-10-CM | POA: Diagnosis not present

## 2013-11-12 DIAGNOSIS — G2581 Restless legs syndrome: Secondary | ICD-10-CM | POA: Insufficient documentation

## 2013-11-12 DIAGNOSIS — M25552 Pain in left hip: Secondary | ICD-10-CM | POA: Insufficient documentation

## 2013-11-12 DIAGNOSIS — G8929 Other chronic pain: Secondary | ICD-10-CM | POA: Diagnosis not present

## 2013-11-12 DIAGNOSIS — Z8719 Personal history of other diseases of the digestive system: Secondary | ICD-10-CM | POA: Diagnosis not present

## 2013-11-12 DIAGNOSIS — R05 Cough: Secondary | ICD-10-CM

## 2013-11-12 DIAGNOSIS — R059 Cough, unspecified: Secondary | ICD-10-CM

## 2013-11-12 DIAGNOSIS — J40 Bronchitis, not specified as acute or chronic: Secondary | ICD-10-CM

## 2013-11-12 LAB — URINALYSIS, ROUTINE W REFLEX MICROSCOPIC
Bilirubin Urine: NEGATIVE
Glucose, UA: NEGATIVE mg/dL
Hgb urine dipstick: NEGATIVE
Ketones, ur: NEGATIVE mg/dL
Leukocytes, UA: NEGATIVE
Nitrite: NEGATIVE
Protein, ur: NEGATIVE mg/dL
Specific Gravity, Urine: 1.014 (ref 1.005–1.030)
Urobilinogen, UA: 1 mg/dL (ref 0.0–1.0)
pH: 8 (ref 5.0–8.0)

## 2013-11-12 LAB — BASIC METABOLIC PANEL
Anion gap: 12 (ref 5–15)
BUN: 10 mg/dL (ref 6–23)
CO2: 25 mEq/L (ref 19–32)
Calcium: 8.9 mg/dL (ref 8.4–10.5)
Chloride: 99 mEq/L (ref 96–112)
Creatinine, Ser: 0.67 mg/dL (ref 0.50–1.10)
GFR calc Af Amer: >90 mL/min (ref >90)
GFR calc non Af Amer: >90 mL/min (ref >90)
Glucose, Bld: 101 mg/dL — ABNORMAL HIGH (ref 70–99)
Potassium: 3.7 mEq/L (ref 3.7–5.3)
Sodium: 136 mEq/L — ABNORMAL LOW (ref 137–147)

## 2013-11-12 LAB — CBC WITH DIFFERENTIAL/PLATELET
Basophils Absolute: 0 10*3/uL (ref 0.0–0.1)
Basophils Relative: 0 % (ref 0–1)
Eosinophils Absolute: 0.1 10*3/uL (ref 0.0–0.7)
Eosinophils Relative: 1 % (ref 0–5)
HCT: 33.2 % — ABNORMAL LOW (ref 36.0–46.0)
Hemoglobin: 11.4 g/dL — ABNORMAL LOW (ref 12.0–15.0)
Lymphocytes Relative: 24 % (ref 12–46)
Lymphs Abs: 2.7 10*3/uL (ref 0.7–4.0)
MCH: 30.9 pg (ref 26.0–34.0)
MCHC: 34.3 g/dL (ref 30.0–36.0)
MCV: 90 fL (ref 78.0–100.0)
Monocytes Absolute: 0.9 10*3/uL (ref 0.1–1.0)
Monocytes Relative: 8 % (ref 3–12)
Neutro Abs: 7.4 10*3/uL (ref 1.7–7.7)
Neutrophils Relative %: 67 % (ref 43–77)
Platelets: 215 10*3/uL (ref 150–400)
RBC: 3.69 MIL/uL — ABNORMAL LOW (ref 3.87–5.11)
RDW: 13.3 % (ref 11.5–15.5)
WBC: 11.1 10*3/uL — ABNORMAL HIGH (ref 4.0–10.5)

## 2013-11-12 LAB — POCT INFLUENZA A/B
Influenza A, POC: NEGATIVE
Influenza B, POC: NEGATIVE

## 2013-11-12 LAB — RAPID STREP SCREEN (MED CTR MEBANE ONLY): Streptococcus, Group A Screen (Direct): NEGATIVE

## 2013-11-12 LAB — I-STAT CG4 LACTIC ACID, ED: Lactic Acid, Venous: 1.28 mmol/L (ref 0.5–2.2)

## 2013-11-12 MED ORDER — IBUPROFEN 200 MG PO TABS
400.0000 mg | ORAL_TABLET | Freq: Once | ORAL | Status: AC
Start: 2013-11-12 — End: 2013-11-12
  Administered 2013-11-12: 400 mg via ORAL

## 2013-11-12 MED ORDER — LEVETIRACETAM 500 MG PO TABS
500.0000 mg | ORAL_TABLET | Freq: Once | ORAL | Status: AC
  Administered 2013-11-12: 500 mg via ORAL
  Filled 2013-11-12: qty 1

## 2013-11-12 MED ORDER — AZITHROMYCIN 250 MG PO TABS: ORAL_TABLET | ORAL | Status: DC

## 2013-11-12 MED ORDER — ACETAMINOPHEN 325 MG PO TABS
650.0000 mg | ORAL_TABLET | Freq: Once | ORAL | Status: AC
  Administered 2013-11-12: 650 mg via ORAL
  Filled 2013-11-12: qty 2

## 2013-11-12 MED ORDER — SODIUM CHLORIDE 0.9 % IV BOLUS (SEPSIS)
1000.0000 mL | Freq: Once | INTRAVENOUS | Status: AC
  Administered 2013-11-12: 1000 mL via INTRAVENOUS

## 2013-11-12 NOTE — ED Notes (Signed)
This RN spoke to patients husband [name]) regarding patients status.  Patients son to come and pick the patient up for discharge.

## 2013-11-12 NOTE — Progress Notes (Signed)
   Subjective:    Patient ID: [name], female    DOB: [birth year], 48 y.o.   MRN: [id number]  HPI Patient presents today with complaints fever of 104 at 9:39 this am, no antipyretic administered while at home. Upon arrival at clinic patient's temp 103. 6, alert and oriented, ibuprofen given. Patient placed in exam room and began to seize while LPN present, NP arrived during seizure. Patient was sitting in chair, with head leaned toward right, mild jerking of head, arms, and legs, and brief loss of consciousness. No respiratory distress noted during seizure. Seizure activity lasted for 1 minute. O2 via nasal cannula @ 2 Liters applied. Patient drowsy afterwards, but oriented and answered questions appropriately. Patient had a second seizure with same characteristics. Medical staff remained with patient from onset of first seizure until EMS arrived. Patient has a history of seizures, last seizure occurred 2 weeks ago.  Patient's son present and aware EMS has been notified for transport to [facility].    Review of Systems  Constitutional: Positive for fever and chills.  HENT: Negative for congestion, postnasal drip and sore throat.   Respiratory: Negative for chest tightness and shortness of breath.   Cardiovascular: Negative for chest pain and palpitations.  Neurological: Positive for seizures. Negative for dizziness, speech difficulty and weakness.       Seizure occurred in office       Objective:   Physical Exam  Constitutional: She is oriented to person, place, and time. She appears well-developed and well-nourished.  HENT:  Head: Normocephalic and atraumatic.  Cardiovascular: Regular rhythm and normal heart sounds.  Tachycardia present.   Pulmonary/Chest: Effort normal and breath sounds normal. No respiratory distress. She exhibits no tenderness.  Neurological: She is alert and oriented to person, place, and time.  Skin: Skin is warm and dry.  Psychiatric: She has a normal  mood and affect.     Results for orders placed in visit on 11/12/13  POCT INFLUENZA A/B      Result Value Ref Range   Influenza A, POC Negative     Influenza B, POC Negative         Assessment & Plan:  1. Fever and chills  - ibuprofen (ADVIL,MOTRIN) tablet 400 mg; Take 2 tablets (400 mg total) by mouth once.  2. Seizures   3. Other specified fever  - POCT Influenza A/B -EMS transport to [place], FNP-C

## 2013-11-12 NOTE — ED Provider Notes (Signed)
CSN: [id number]     Arrival date & time 11/12/13  1237 History   First MD Initiated Contact with Patient 11/12/13 1253     Chief Complaint  Patient presents with  . 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 Patient was given ibuprofen prior to seizure activity. Patient complains of chronic left hip pain, patient has had surgery in the past no recent surgery. Patient has had decreased urine output recently, decreased oral intake. Fever started this morning at 4 AM. No headache or other neuro symptoms, no neck stiffness or sore throat. Patient is on Lamictal Keppra for seizures. No change in medicine recently or missed doses.  Patient is a 48 y.o. female presenting with seizures. The history is provided by the patient.  Seizures   Past Medical History  Diagnosis Date  . Vertigo   . Constipation   . Restless leg   . High cholesterol   . Chronic bronchitis     "yearly; when the weather changes" (04/22/2012)  . [id number](478.2)     "1/wk" (04/22/2012)  . Depression   . Epilepsy     "been having them right often here lately" (04/22/2012)  . Seizures   . Arthritis     "knees" (04/22/2012)  . Osteoarthritis     [name] 04/22/2012  . Colon polyps     adenomatous and hyperplastic-  . Rectal bleed     in toilet- bright red  . Other convulsions 05/21/12    non-epileptic spells   Past Surgical History  Procedure Laterality Date  . Total hip arthroplasty Left 1993; 1995; 2000  . Abdominal hysterectomy  2001  . Colonoscopy    . Joint replacement    . Mass excision  10/22/2011    Procedure: EXCISION MASS;  Surgeon: [name], MD;  Location: [facility];  Service: General;  Laterality: Right;  excision right buttock mass   . Bladder suspension    . Cesarean section  1987; 1988  . Bunionectomy Left 2000   Family History  Problem Relation Age of Onset  . Cancer Mother   . Cancer Brother   . Diabetes Brother   . Cancer Maternal Aunt   . Diabetes Maternal Aunt    History  Substance Use Topics  . Smoking status: Never Smoker   . Smokeless tobacco: Never Used  . Alcohol Use: No   OB History   Grav Para Term Preterm Abortions TAB SAB Ect Mult Living                 Review of Systems  Constitutional: Positive for appetite change and fatigue. Negative for fever and chills.  HENT: Negative for congestion.   Eyes: Negative for visual disturbance.  Respiratory: Negative for shortness of breath.   Cardiovascular: Negative for chest pain.  Gastrointestinal: Negative for vomiting and abdominal pain.  Genitourinary: Negative for dysuria and flank pain.  Musculoskeletal: Positive for arthralgias. Negative for back pain, neck pain and neck stiffness.  Skin: Negative for rash.  Neurological: Positive for seizures. Negative for light-headedness and headaches.      Allergies  Vimpat; Benadryl; Dilantin; Melatonin; Ultram; Vicodin; Betadine; Codeine; Latex; Penicillins; and Tape  Home Medications   Prior to Admission medications   Medication Sig Start Date End Date Taking? Authorizing Provider  clonazePAM (KLONOPIN) 0.5 MG tablet TAKE ONE  TABLET BY MOUTH THREE TIMES DAILY 11/04/13  Yes Lysbeth Penner, FNP  diclofenac sodium (VOLTAREN) 1 % GEL Apply 1 application topically daily as needed (foot pain).   Yes Historical Provider, MD  FLUoxetine (PROZAC) 20 MG capsule Take 2 capsules (40 mg total) by mouth daily. 07/26/13  Yes [name], FNP  gabapentin (NEURONTIN) 600 MG tablet Take 600-1,200 mg by mouth See admin instructions. Take 1 capsule (600 mg) 3 times daily with meals and take 2 capsules (1200 mg) at bedtime total 3000 mg per day. 08/17/13  Yes Carmen Dohmeier, MD  Iron-Vitamins (GERITOL PO)  Take 1 tablet by mouth daily.   Yes Historical Provider, MD  lamoTRIgine (LAMICTAL) 100 MG tablet Take 1 tablet (100 mg total) by mouth 2 (two) times daily. 08/17/13  Yes Asencion Partridge Dohmeier, MD  levETIRAcetam (KEPPRA) 500 MG tablet Take 1 tablet (500 mg total) by mouth 2 (two) times daily. 08/17/13  Yes Carmen Dohmeier, MD  methylPREDNIsolone (MEDROL DOSPACK) 4 MG tablet Take 4 mg by mouth as directed. follow package directions 10/26/13 11/12/13 Yes Lysbeth Penner, FNP  omega-3 acid ethyl esters (LOVAZA) 1 G capsule Take 2 capsules (2 g total) by mouth 2 (two) times daily. 04/21/13  Yes Lysbeth Penner, FNP  pramipexole (MIRAPEX) 0.125 MG tablet Take 2 tablets (0.25 mg total) by mouth at bedtime. 09/26/13  Yes Lysbeth Penner, FNP  vitamin B-12 (CYANOCOBALAMIN) 1000 MCG tablet Take 1,000 mcg by mouth daily.   Yes Historical Provider, MD  azithromycin (ZITHROMAX Z-PAK) 250 MG tablet 2 po day one, then 1 daily x 4 days 11/12/13   Mariea Clonts, MD   BP 99/65  Pulse 86  Temp(Src) 98.5 F (36.9 C) (Oral)  Resp 16  Ht 5' (1.524 m)  Wt 179 lb (81.194 kg)  BMI 34.96 kg/m2  SpO2 96% Physical Exam  Nursing note and vitals reviewed. Constitutional: She is oriented to person, place, and time. She appears well-developed and well-nourished.  HENT:  Head: Normocephalic and atraumatic.  Dry mucous membranes, neck supple no meningismus for range of motion head and neck  Eyes: Conjunctivae are normal. Right eye exhibits no discharge. Left eye exhibits no discharge.  Neck: Normal range of motion. Neck supple. No tracheal deviation present.  Cardiovascular: Normal rate and regular rhythm.   Pulmonary/Chest: Effort normal and breath sounds normal.  Abdominal: Soft. She exhibits no distension. There is no tenderness. There is no guarding.  Musculoskeletal: She exhibits no edema.  Neurological: She is alert and oriented to person, place, and time. GCS eye subscore is 4. GCS verbal subscore is 5. GCS motor  subscore is 6.  Mild fatigue appearance. 5+ strength in UE and LE with f/e at major joints. Sensation to palpation intact in UE and LE. CNs 2-12 grossly intact.  EOMFI.  PERRL.   Finger nose and coordination intact bilateral.   Visual fields intact to finger testing. No seizure activity in the ER   Skin: Skin is warm. No rash noted.  Psychiatric: She has a normal mood and affect.    ED Course  Procedures (including critical care time) Labs Review Labs Reviewed  BASIC METABOLIC PANEL - Abnormal; Notable for the following:    Sodium 136 (*)    Glucose, Bld 101 (*)    All other components within normal limits  CBC WITH DIFFERENTIAL - Abnormal; Notable for the following:    WBC 11.1 (*)    RBC 3.69 (*)    Hemoglobin 11.4 (*)  HCT 33.2 (*)    All other components within normal limits  RAPID STREP SCREEN  URINE CULTURE  CULTURE, GROUP A STREP  URINALYSIS, ROUTINE W REFLEX MICROSCOPIC  LAMOTRIGINE LEVEL  I-STAT CG4 LACTIC ACID, ED    Imaging Review Dg Chest 2 View  11/12/2013   CLINICAL DATA:  Cough.  Short of breath.  Chest pain for 1 day.  EXAM: CHEST  2 VIEW  COMPARISON:  06/28/2013  FINDINGS: Under aerated lungs with basilar atelectasis. Upper lungs clear. Normal heart size. No pneumothorax. No pleural fluid. No vertebral compression.  IMPRESSION: Bibasilar atelectasis.   Electronically Signed   By: Maryclare Bean M.D.   On: 11/12/2013 14:06     EKG Interpretation None      MDM   Final diagnoses:  Cough  Bronchitis  Observed seizure-like activity  Fever, unspecified fever cause   Patient presents with fever, urinary changes and cough morning. Patient had 2 witnessed brief seizures and has mild fatigue appearance but otherwise normal neurologic exam. Plan for infectious workup, IV fluids and monitoring. No signs of meningitis at this time.  Patient observed in the ER, no seizure activity, patient well-appearing on recheck, vitals improved, temperature improved.  Clinically only source of infection is cough/upper respiratory infection. With fever plan for oral antibiotics and close outpatient follow-up. Patient doesn't have any signs of encephalitis or meningitis. No concerning rashes. Patient comfortable with close outpatient follow-up.  Results and differential diagnosis were discussed with the patient/parent/guardian. Close follow up outpatient was discussed, comfortable with the plan.   Medications  sodium chloride 0.9 % bolus 1,000 mL (0 mLs Intravenous Stopped 11/12/13 1455)  acetaminophen (TYLENOL) tablet 650 mg (650 mg Oral Given 11/12/13 1458)  levETIRAcetam (KEPPRA) tablet 500 mg (500 mg Oral Given 11/12/13 1458)    Filed Vitals:   11/12/13 1330 11/12/13 1454 11/12/13 1455 11/12/13 1458  BP: 101/63 99/65    Pulse: 92 88  86  Temp:   98.5 F (36.9 C)   TempSrc:   Oral   Resp: 22   16  Height:      Weight:      SpO2: 95% 92%  96%    Final diagnoses:  Cough  Bronchitis  Observed seizure-like activity  Fever, unspecified fever cause        Mariea Clonts, MD 11/12/13 1544

## 2013-11-12 NOTE — Discharge Instructions (Signed)
If you were given medicines take as directed.  If you are on coumadin or contraceptives realize their levels and effectiveness is altered by many different medicines.  If you have any reaction (rash, tongues swelling, other) to the medicines stop taking and see a physician.   Please follow up as directed and return to the ER or see a physician for new or worsening symptoms.  Thank you. Filed Vitals:   11/12/13 1330 11/12/13 1454 11/12/13 1455 11/12/13 1458  BP: 101/63 99/65    Pulse: 92 88  86  Temp:   98.5 F (36.9 C)   TempSrc:   Oral   Resp: 22   16  Height:      Weight:      SpO2: 95% 92%  96%

## 2013-11-12 NOTE — ED Notes (Signed)
EMS - Patient was at her PCP with c/o of fever of 103.5.  While at the PCP, patient had two witnessed seizures each lasting approximately 1 minute.  Pt was given 400mg  Ibuprofen just prior to the seizure activity.  Patient is having complaints of left hip pain, this is chronic according to the patient.  Patient states the last time she urinated was last night 11/11/13 at 10pm.

## 2013-11-12 NOTE — Progress Notes (Signed)
Spoken with DR. Olevia Bowens about test and procedures done [n this admission . Discharge instructions given to pt verbalized unnderstanding. Wheeled to lobby by NT

## 2013-11-12 NOTE — ED Notes (Signed)
Pt assisted to restroom with steady gait. Pt reports unable to urinate

## 2013-11-12 NOTE — Patient Instructions (Signed)

## 2013-11-12 NOTE — ED Notes (Signed)
[name] [phone].

## 2013-11-13 LAB — URINE CULTURE
Colony Count: NO GROWTH
Culture: NO GROWTH

## 2013-11-14 ENCOUNTER — Other Ambulatory Visit: Payer: Self-pay | Admitting: *Deleted

## 2013-11-14 LAB — CULTURE, GROUP A STREP

## 2013-11-15 ENCOUNTER — Telehealth: Payer: Self-pay | Admitting: Nurse Practitioner

## 2013-11-15 MED ORDER — MAGIC MOUTHWASH: 5.0000 mL | Freq: Four times a day (QID) | ORAL | Status: DC

## 2013-11-15 NOTE — Telephone Encounter (Signed)
Magic mouthwash sent to pharmacy 

## 2013-11-15 NOTE — Telephone Encounter (Signed)
Pt was sent to ER from our office. Pt given Erythromycin for bronchitis and now has blisters in mouth. What does she need to do? No fever now, but still has cough.

## 2013-11-16 LAB — LAMOTRIGINE LEVEL: Lamotrigine Lvl: 5.5 ug/mL (ref 4.0–18.0)

## 2013-12-04 ENCOUNTER — Emergency Department (HOSPITAL_BASED_OUTPATIENT_CLINIC_OR_DEPARTMENT_OTHER)
Admission: EM | Admit: 2013-12-04 | Discharge: 2013-12-04 | Disposition: A | Discharge status: Home / Self Care | Payer: Medicare Other | Attending: Emergency Medicine | Admitting: Emergency Medicine

## 2013-12-04 ENCOUNTER — Encounter (HOSPITAL_BASED_OUTPATIENT_CLINIC_OR_DEPARTMENT_OTHER): Payer: Self-pay

## 2013-12-04 ENCOUNTER — Emergency Department (HOSPITAL_BASED_OUTPATIENT_CLINIC_OR_DEPARTMENT_OTHER): Payer: Medicare Other

## 2013-12-04 DIAGNOSIS — Z90710 Acquired absence of both cervix and uterus: Secondary | ICD-10-CM | POA: Insufficient documentation

## 2013-12-04 DIAGNOSIS — G2581 Restless legs syndrome: Secondary | ICD-10-CM | POA: Diagnosis not present

## 2013-12-04 DIAGNOSIS — Z79899 Other long term (current) drug therapy: Secondary | ICD-10-CM | POA: Insufficient documentation

## 2013-12-04 DIAGNOSIS — Z880 Allergy status to penicillin: Secondary | ICD-10-CM | POA: Insufficient documentation

## 2013-12-04 DIAGNOSIS — M179 Osteoarthritis of knee, unspecified: Secondary | ICD-10-CM | POA: Diagnosis not present

## 2013-12-04 DIAGNOSIS — N200 Calculus of kidney: Secondary | ICD-10-CM

## 2013-12-04 DIAGNOSIS — Z8639 Personal history of other endocrine, nutritional and metabolic disease: Secondary | ICD-10-CM | POA: Diagnosis not present

## 2013-12-04 DIAGNOSIS — Z8719 Personal history of other diseases of the digestive system: Secondary | ICD-10-CM | POA: Diagnosis not present

## 2013-12-04 DIAGNOSIS — Z86010 Personal history of colonic polyps: Secondary | ICD-10-CM | POA: Insufficient documentation

## 2013-12-04 DIAGNOSIS — G40909 Epilepsy, unspecified, not intractable, without status epilepticus: Secondary | ICD-10-CM | POA: Insufficient documentation

## 2013-12-04 DIAGNOSIS — F329 Major depressive disorder, single episode, unspecified: Secondary | ICD-10-CM | POA: Diagnosis not present

## 2013-12-04 DIAGNOSIS — Z8709 Personal history of other diseases of the respiratory system: Secondary | ICD-10-CM | POA: Insufficient documentation

## 2013-12-04 DIAGNOSIS — R109 Unspecified abdominal pain: Secondary | ICD-10-CM | POA: Diagnosis present

## 2013-12-04 DIAGNOSIS — M1990 Unspecified osteoarthritis, unspecified site: Secondary | ICD-10-CM | POA: Diagnosis not present

## 2013-12-04 DIAGNOSIS — Z91040 Latex allergy status: Secondary | ICD-10-CM | POA: Diagnosis not present

## 2013-12-04 LAB — URINALYSIS, ROUTINE W REFLEX MICROSCOPIC
Bilirubin Urine: NEGATIVE
Glucose, UA: NEGATIVE mg/dL
Ketones, ur: NEGATIVE mg/dL
Leukocytes, UA: NEGATIVE
Nitrite: NEGATIVE
Protein, ur: NEGATIVE mg/dL
Specific Gravity, Urine: 1.013 (ref 1.005–1.030)
Urobilinogen, UA: 0.2 mg/dL (ref 0.0–1.0)
pH: 6 (ref 5.0–8.0)

## 2013-12-04 LAB — BASIC METABOLIC PANEL
Anion gap: 12 (ref 5–15)
BUN: 15 mg/dL (ref 6–23)
CO2: 25 mEq/L (ref 19–32)
Calcium: 9.4 mg/dL (ref 8.4–10.5)
Chloride: 104 mEq/L (ref 96–112)
Creatinine, Ser: 1.1 mg/dL (ref 0.50–1.10)
GFR calc Af Amer: 68 mL/min — ABNORMAL LOW (ref >90)
GFR calc non Af Amer: 58 mL/min — ABNORMAL LOW (ref >90)
Glucose, Bld: 102 mg/dL — ABNORMAL HIGH (ref 70–99)
Potassium: 4.1 mEq/L (ref 3.7–5.3)
Sodium: 141 mEq/L (ref 137–147)

## 2013-12-04 LAB — CBC WITH DIFFERENTIAL/PLATELET
Basophils Absolute: 0 10*3/uL (ref 0.0–0.1)
Basophils Relative: 0 % (ref 0–1)
Eosinophils Absolute: 0.2 10*3/uL (ref 0.0–0.7)
Eosinophils Relative: 2 % (ref 0–5)
HCT: 36.8 % (ref 36.0–46.0)
Hemoglobin: 12.1 g/dL (ref 12.0–15.0)
Lymphocytes Relative: 22 % (ref 12–46)
Lymphs Abs: 2.3 10*3/uL (ref 0.7–4.0)
MCH: 30.5 pg (ref 26.0–34.0)
MCHC: 32.9 g/dL (ref 30.0–36.0)
MCV: 92.7 fL (ref 78.0–100.0)
Monocytes Absolute: 0.9 10*3/uL (ref 0.1–1.0)
Monocytes Relative: 9 % (ref 3–12)
Neutro Abs: 6.7 10*3/uL (ref 1.7–7.7)
Neutrophils Relative %: 67 % (ref 43–77)
Platelets: 214 10*3/uL (ref 150–400)
RBC: 3.97 MIL/uL (ref 3.87–5.11)
RDW: 12.7 % (ref 11.5–15.5)
WBC: 10.1 10*3/uL (ref 4.0–10.5)

## 2013-12-04 LAB — URINE MICROSCOPIC-ADD ON

## 2013-12-04 MED ORDER — OXYCODONE-ACETAMINOPHEN 5-325 MG PO TABS: 2.0000 | ORAL_TABLET | ORAL | Status: DC | PRN

## 2013-12-04 MED ORDER — TAMSULOSIN HCL 0.4 MG PO CAPS: 0.4000 mg | ORAL_CAPSULE | Freq: Every day | ORAL | Status: DC

## 2013-12-04 MED ORDER — HYDROMORPHONE HCL 1 MG/ML IJ SOLN
1.0000 mg | Freq: Once | INTRAMUSCULAR | Status: AC
  Administered 2013-12-04: 1 mg via INTRAMUSCULAR
  Filled 2013-12-04: qty 1

## 2013-12-04 NOTE — ED Provider Notes (Signed)
CSN: [id number]     Arrival date & time 12/04/13  1235 History   First MD Initiated Contact with Patient 12/04/13 1258     Chief Complaint  Patient presents with  . Flank Pain     (Consider location/radiation/quality/duration/timing/severity/associated sxs/prior Treatment) HPI   48 year old female with history of seizure and pseudoseizure, kidney stones, presents complaining of left flank pain. Patient reports intermittent pain to her left flank for the past week. Describe pain as sharp achy sensation, nonradiating, nothing seems to make it better or worse. She has tried ice and heat with minimal relief. She reported no fever, chills, chest pain, shortness of breath, abdominal pain, dysuria, hematuria, vaginal bleeding, vaginal discharge, bowel bladder incontinence, saddle paresthesia, or rash. Patient states she has a history of kidney stones in the past, last stone was several months ago. She denies any prior history of urethral stenting or lithotripsy. She denies any recent trauma. No pain down the legs. She reported having difficulty urinating but this is chronic.  Past Medical History  Diagnosis Date  . Vertigo   . Constipation   . Restless leg   . High cholesterol   . Chronic bronchitis     "yearly; when the weather changes" (04/22/2012)  . ZYYQMGNO(037.0)     "1/wk" (04/22/2012)  . Depression   . Epilepsy     "been having them right often here lately" (04/22/2012)  . Seizures   . Arthritis     "knees" (04/22/2012)  . Osteoarthritis     [name] 04/22/2012  . Colon polyps     adenomatous and hyperplastic-  . Rectal bleed     in toilet- bright red  . Other convulsions 05/21/12    non-epileptic spells   Past Surgical History  Procedure Laterality Date  . Total hip arthroplasty Left 1993; 1995; 2000  . Abdominal hysterectomy  2001  . Colonoscopy    . Joint replacement    . Mass excision  10/22/2011    Procedure: EXCISION MASS;  Surgeon: Harl , MD;  Location: [place];  Service: General;  Laterality: Right;  excision right buttock mass  . Bladder suspension    . Cesarean section  1987; 1988  . Bunionectomy Left 2000   Family History  Problem Relation Age of Onset  . Cancer Mother   . Cancer Brother   . Diabetes Brother   . Cancer Maternal Aunt   . Diabetes Maternal Aunt    History  Substance Use Topics  . Smoking status: Never Smoker   . Smokeless tobacco: Never Used  . Alcohol Use: No   OB History    No data available     Review of Systems  All other systems reviewed and are negative.     Allergies  Vimpat; Benadryl; Dilantin; Melatonin; Ultram; Vicodin; Betadine; Codeine; Latex; Penicillins; and Tape  Home Medications   Prior to Admission medications   Medication Sig Start Date End Date Taking? Authorizing Provider  Alum & Mag Hydroxide-Simeth (MAGIC MOUTHWASH) SOLN Take 5 mLs by mouth 4 (four) times daily. 11/15/13   [name], FNP  azithromycin (ZITHROMAX Z-PAK) 250 MG tablet 2 po day one, then 1 daily x 4 days 11/12/13   Mariea Clonts, MD  clonazePAM (KLONOPIN) 0.5 MG tablet TAKE ONE TABLET BY MOUTH THREE TIMES DAILY 11/04/13   Lysbeth Penner, FNP  diclofenac sodium (VOLTAREN) 1 % GEL Apply 1 application topically daily as needed (foot pain).    Historical Provider, MD  FLUoxetine [facility])  20 MG capsule Take 2 capsules (40 mg total) by mouth daily. 07/26/13   [name], FNP  gabapentin (NEURONTIN) 600 MG tablet Take 600-1,200 mg by mouth See admin instructions. Take 1 capsule (600 mg) 3 times daily with meals and take 2 capsules (1200 mg) at bedtime total 3000 mg per day. 08/17/13   Asencion Partridge Dohmeier, MD  Iron-Vitamins (GERITOL PO) Take 1 tablet by mouth daily.    Historical Provider, MD  lamoTRIgine (LAMICTAL) 100 MG tablet Take 1 tablet (100 mg total) by mouth 2 (two) times daily. 08/17/13   Asencion Partridge Dohmeier, MD  levETIRAcetam (KEPPRA) 500 MG tablet Take 1 tablet (500 mg total) by mouth 2 (two) times daily.  08/17/13   [name], MD  omega-3 acid ethyl esters (LOVAZA) 1 G capsule Take 2 capsules (2 g total) by mouth 2 (two) times daily. 04/21/13   Lysbeth Penner, FNP  pramipexole (MIRAPEX) 0.125 MG tablet Take 2 tablets (0.25 mg total) by mouth at bedtime. 09/26/13   Lysbeth Penner, FNP  vitamin B-12 (CYANOCOBALAMIN) 1000 MCG tablet Take 1,000 mcg by mouth daily.    Historical Provider, MD   BP 140/102 mmHg  Pulse 83  Temp(Src) 97.5 F (36.4 C) (Oral)  Resp 20  Ht 5' (1.524 m)  Wt 175 lb (79.379 kg)  BMI 34.18 kg/m2  SpO2 95% Physical Exam  Constitutional: She appears well-developed and well-nourished. No distress.  HENT:  Head: Atraumatic.  Eyes: Conjunctivae are normal.  Neck: Neck supple.  Cardiovascular: Normal rate and regular rhythm.   Pulmonary/Chest: Effort normal and breath sounds normal.  Abdominal: Soft. There is no tenderness.   tenderness to left paralumbar muscle to palpation but no CVA tenderness. No overlying skin changes.  Musculoskeletal:  No significant midline spine tenderness, negative straight leg raise bilaterally, intact patellar deep tendon reflex and no foot drop. Bilateral lower extremities without palpable cords, erythema, edema.    Neurological: She is alert.  Skin: No rash noted.  Psychiatric: She has a normal mood and affect.  Nursing note and vitals reviewed.   ED Course  Procedures (including critical care time)  1:49 PM Pt here with reproducible L flank pain on palpation but no CVA tenderness.  Pt report dilaudid helps with pain, i offered toradol, however son at bedside mentioned toradol causes seizure.    4:37 PM UA with moderate hemoglobin but no evidence of urinary tract infection. Labs are reassuring, renal function is reassuring. CT scan demonstrated moderate left hydroureteronephrosis with probable 5 mm distal left ureteral calculus.  Finding consistent with pt's complaint.  Pt's pain is controlled.  Able to tolerates PO.  Pain  medication, flomax and urology referral given.   Labs Review Labs Reviewed  URINALYSIS, ROUTINE W REFLEX MICROSCOPIC - Abnormal; Notable for the following:    Hgb urine dipstick LARGE (*)    All other components within normal limits  BASIC METABOLIC PANEL - Abnormal; Notable for the following:    Glucose, Bld 102 (*)    GFR calc non Af Amer 58 (*)    GFR calc Af Amer 68 (*)    All other components within normal limits  CBC WITH DIFFERENTIAL  URINE MICROSCOPIC-ADD ON  CBC WITH DIFFERENTIAL    Imaging Review Ct Renal Stone Study  12/04/2013   CLINICAL DATA:  48 year old female with left flank, abdominal and pelvic pain.  EXAM: CT ABDOMEN AND PELVIS WITHOUT CONTRAST  TECHNIQUE: Multidetector CT imaging of the abdomen and pelvis was performed following the standard  protocol without IV contrast.  COMPARISON:  08/19/2007 CT  FINDINGS: Moderate left hydroureteronephrosis is identified extending into the pelvis. A 5 mm calcification likely lies within the distal left ureter, but difficult to determine with certainty secondary to left hip replacement metallic artifact.  Multiple bilateral renal calculi are identified, all measuring 5 mm or less and include 7 on the right for on the left.  The liver, gallbladder, spleen, adrenal glands, and pancreas are unremarkable.  Please note that parenchymal abnormalities may be missed without intravenous contrast.  There is no evidence of free fluid, enlarged lymph nodes, biliary dilation or abdominal aortic aneurysm.  The bowel and appendix are unremarkable.  The visualized portions of the bladder are unremarkable.  No acute or suspicious bony abnormalities are identified.  IMPRESSION: Moderate left hydroureteronephrosis with probable 5 mm distal left ureteral calculus, but metallic artifact in the pelvis from left hip replacement obscures detail on this area.  Bilateral nonobstructing renal calculi.   Electronically Signed   By: Hassan Rowan M.D.   On: 12/04/2013 16:04      EKG Interpretation None      MDM   Final diagnoses:  Left flank pain  Kidney stone on left side    BP 117/77 mmHg  Pulse 75  Temp(Src) 97.5 F (36.4 C) (Oral)  Resp 16  Ht 5' (1.524 m)  Wt 175 lb (79.379 kg)  BMI 34.18 kg/m2  SpO2 99%  I have reviewed nursing notes and vital signs. I personally reviewed the imaging tests through PACS system  I reviewed available ER/hospitalization records thought the EMR     Domenic Moras, PA-C 12/04/13 [place], MD 12/06/13 [phone]

## 2013-12-04 NOTE — ED Notes (Signed)
Patient here with left sided flank pain that she describes as intermittently x 1 week. Reports that she has had kidney stones in past

## 2013-12-04 NOTE — Discharge Instructions (Signed)
You have a 42mm kidney stone on the left side.  Use a urine strainer to capture the stone and bring it to your urologist next week for evaluation.   Kidney Stones Kidney stones (urolithiasis) are deposits that form inside your kidneys. The intense pain is caused by the stone moving through the urinary tract. When the stone moves, the ureter goes into spasm around the stone. The stone is usually passed in the urine.  CAUSES   A disorder that makes certain neck glands produce too much parathyroid hormone (primary hyperparathyroidism).  A buildup of uric acid crystals, similar to gout in your joints.  Narrowing (stricture) of the ureter.  A kidney obstruction present at birth (congenital obstruction).  Previous surgery on the kidney or ureters.  Numerous kidney infections. SYMPTOMS   Feeling sick to your stomach (nauseous).  Throwing up (vomiting).  Blood in the urine (hematuria).  Pain that usually spreads (radiates) to the groin.  Frequency or urgency of urination. DIAGNOSIS   Taking a history and physical exam.  Blood or urine tests.  CT scan.  Occasionally, an examination of the inside of the urinary bladder (cystoscopy) is performed. TREATMENT   Observation.  Increasing your fluid intake.  Extracorporeal shock wave lithotripsy--This is a noninvasive procedure that uses shock waves to break up kidney stones.  Surgery may be needed if you have severe pain or persistent obstruction. There are various surgical procedures. Most of the procedures are performed with the use of small instruments. Only small incisions are needed to accommodate these instruments, so recovery time is minimized. The size, location, and chemical composition are all important variables that will determine the proper choice of action for you. Talk to your health care provider to better understand your situation so that you will minimize the risk of injury to yourself and your kidney.  HOME CARE  INSTRUCTIONS   Drink enough water and fluids to keep your urine clear or pale yellow. This will help you to pass the stone or stone fragments.  Strain all urine through the provided strainer. Keep all particulate matter and stones for your health care provider to see. The stone causing the pain may be as small as a grain of salt. It is very important to use the strainer each and every time you pass your urine. The collection of your stone will allow your health care provider to analyze it and verify that a stone has actually passed. The stone analysis will often identify what you can do to reduce the incidence of recurrences.  Only take over-the-counter or prescription medicines for pain, discomfort, or fever as directed by your health care provider.  Make a follow-up appointment with your health care provider as directed.  Get follow-up X-rays if required. The absence of pain does not always mean that the stone has passed. It may have only stopped moving. If the urine remains completely obstructed, it can cause loss of kidney function or even complete destruction of the kidney. It is your responsibility to make sure X-rays and follow-ups are completed. Ultrasounds of the kidney can show blockages and the status of the kidney. Ultrasounds are not associated with any radiation and can be performed easily in a matter of minutes. SEEK MEDICAL CARE IF:  You experience pain that is progressive and unresponsive to any pain medicine you have been prescribed. SEEK IMMEDIATE MEDICAL CARE IF:   Pain cannot be controlled with the prescribed medicine.  You have a fever or shaking chills.  The severity  or intensity of pain increases over 18 hours and is not relieved by pain medicine.  You develop a new onset of abdominal pain.  You feel faint or pass out.  You are unable to urinate. MAKE SURE YOU:   Understand these instructions.  Will watch your condition.  Will get help right away if you are  not doing well or get worse. Document Released: 12/30/2004 Document Revised: 09/01/2012 Document Reviewed: 06/02/2012 [facility] Patient Information 2015 [place], [place]. This information is not intended to replace advice given to you by your health care provider. Make sure you discuss any questions you have with your health care provider.

## 2013-12-05 ENCOUNTER — Telehealth: Payer: Self-pay | Admitting: Nurse Practitioner

## 2013-12-05 NOTE — Telephone Encounter (Signed)
Stp, she was seen in urgent care this past Saturday ct showed kidney stones was given flowmax, pt states she doesn't feel like she is completely emptying her bladder and is hydrating well, advised pt we don't have any openings today but could get her in tomorrow in the evening clinic, pt states she feels pressure and bloating and won't make it that long, advised pt to go to the urgent care, pt voiced understanding.

## 2013-12-13 ENCOUNTER — Telehealth: Payer: Self-pay | Admitting: Family Medicine

## 2013-12-13 ENCOUNTER — Emergency Department (HOSPITAL_BASED_OUTPATIENT_CLINIC_OR_DEPARTMENT_OTHER): Payer: Medicare Other

## 2013-12-13 ENCOUNTER — Encounter (HOSPITAL_BASED_OUTPATIENT_CLINIC_OR_DEPARTMENT_OTHER): Payer: Self-pay | Admitting: *Deleted

## 2013-12-13 ENCOUNTER — Emergency Department (HOSPITAL_BASED_OUTPATIENT_CLINIC_OR_DEPARTMENT_OTHER)
Admission: EM | Admit: 2013-12-13 | Discharge: 2013-12-13 | Disposition: A | Discharge status: Home / Self Care | Payer: Medicare Other | Attending: Emergency Medicine | Admitting: Emergency Medicine

## 2013-12-13 DIAGNOSIS — Z8639 Personal history of other endocrine, nutritional and metabolic disease: Secondary | ICD-10-CM | POA: Diagnosis not present

## 2013-12-13 DIAGNOSIS — Z91040 Latex allergy status: Secondary | ICD-10-CM | POA: Diagnosis not present

## 2013-12-13 DIAGNOSIS — G40909 Epilepsy, unspecified, not intractable, without status epilepticus: Secondary | ICD-10-CM | POA: Insufficient documentation

## 2013-12-13 DIAGNOSIS — Z791 Long term (current) use of non-steroidal anti-inflammatories (NSAID): Secondary | ICD-10-CM | POA: Insufficient documentation

## 2013-12-13 DIAGNOSIS — M25572 Pain in left ankle and joints of left foot: Secondary | ICD-10-CM | POA: Diagnosis present

## 2013-12-13 DIAGNOSIS — Z8719 Personal history of other diseases of the digestive system: Secondary | ICD-10-CM | POA: Diagnosis not present

## 2013-12-13 DIAGNOSIS — Z880 Allergy status to penicillin: Secondary | ICD-10-CM | POA: Insufficient documentation

## 2013-12-13 DIAGNOSIS — Z8669 Personal history of other diseases of the nervous system and sense organs: Secondary | ICD-10-CM | POA: Diagnosis not present

## 2013-12-13 DIAGNOSIS — Z792 Long term (current) use of antibiotics: Secondary | ICD-10-CM | POA: Diagnosis not present

## 2013-12-13 DIAGNOSIS — Z79899 Other long term (current) drug therapy: Secondary | ICD-10-CM | POA: Diagnosis not present

## 2013-12-13 DIAGNOSIS — Z86010 Personal history of colonic polyps: Secondary | ICD-10-CM | POA: Diagnosis not present

## 2013-12-13 DIAGNOSIS — M1990 Unspecified osteoarthritis, unspecified site: Secondary | ICD-10-CM | POA: Diagnosis not present

## 2013-12-13 DIAGNOSIS — R509 Fever, unspecified: Secondary | ICD-10-CM | POA: Insufficient documentation

## 2013-12-13 LAB — CBC WITH DIFFERENTIAL/PLATELET
Basophils Absolute: 0 10*3/uL (ref 0.0–0.1)
Basophils Relative: 0 % (ref 0–1)
Eosinophils Absolute: 0.2 10*3/uL (ref 0.0–0.7)
Eosinophils Relative: 4 % (ref 0–5)
HCT: 34.1 % — ABNORMAL LOW (ref 36.0–46.0)
Hemoglobin: 11.4 g/dL — ABNORMAL LOW (ref 12.0–15.0)
Lymphocytes Relative: 35 % (ref 12–46)
Lymphs Abs: 2.1 10*3/uL (ref 0.7–4.0)
MCH: 31.1 pg (ref 26.0–34.0)
MCHC: 33.4 g/dL (ref 30.0–36.0)
MCV: 92.9 fL (ref 78.0–100.0)
Monocytes Absolute: 0.7 10*3/uL (ref 0.1–1.0)
Monocytes Relative: 11 % (ref 3–12)
Neutro Abs: 3.1 10*3/uL (ref 1.7–7.7)
Neutrophils Relative %: 50 % (ref 43–77)
Platelets: 210 10*3/uL (ref 150–400)
RBC: 3.67 MIL/uL — ABNORMAL LOW (ref 3.87–5.11)
RDW: 12.8 % (ref 11.5–15.5)
WBC: 6.1 10*3/uL (ref 4.0–10.5)

## 2013-12-13 LAB — BASIC METABOLIC PANEL
Anion gap: 13 (ref 5–15)
BUN: 11 mg/dL (ref 6–23)
CO2: 26 mEq/L (ref 19–32)
Calcium: 9.2 mg/dL (ref 8.4–10.5)
Chloride: 102 mEq/L (ref 96–112)
Creatinine, Ser: 0.6 mg/dL (ref 0.50–1.10)
GFR calc Af Amer: >90 mL/min (ref >90)
GFR calc non Af Amer: >90 mL/min (ref >90)
Glucose, Bld: 105 mg/dL — ABNORMAL HIGH (ref 70–99)
Potassium: 4.1 mEq/L (ref 3.7–5.3)
Sodium: 141 mEq/L (ref 137–147)

## 2013-12-13 LAB — D-DIMER, QUANTITATIVE: D-Dimer, Quant: <0.27 ug/mL-FEU (ref 0.00–0.48)

## 2013-12-13 MED ORDER — OXYCODONE-ACETAMINOPHEN 5-325 MG PO TABS: 1.0000 | ORAL_TABLET | ORAL | Status: DC | PRN

## 2013-12-13 MED ORDER — OXYCODONE-ACETAMINOPHEN 5-325 MG PO TABS
1.0000 | ORAL_TABLET | Freq: Once | ORAL | Status: AC
  Administered 2013-12-13: 1 via ORAL
  Filled 2013-12-13: qty 1

## 2013-12-13 NOTE — Telephone Encounter (Signed)
Pt having pain and swelling in foot and lower leg. Hurts to walk on. Pt will go to ER now.

## 2013-12-13 NOTE — Discharge Instructions (Signed)
Arthralgia °Your caregiver has diagnosed you as suffering from an arthralgia. Arthralgia means there is pain in a joint. This can come from many reasons including: °· Bruising the joint which causes soreness (inflammation) in the joint. °· Wear and tear on the joints which occur as we grow older (osteoarthritis). °· Overusing the joint. °· Various forms of arthritis. °· Infections of the joint. °Regardless of the cause of pain in your joint, most of these different pains respond to anti-inflammatory drugs and rest. The exception to this is when a joint is infected, and these cases are treated with antibiotics, if it is a bacterial infection. °HOME CARE INSTRUCTIONS  °· Rest the injured area for as long as directed by your caregiver. Then slowly start using the joint as directed by your caregiver and as the pain allows. Crutches as directed may be useful if the ankles, knees or hips are involved. If the knee was splinted or casted, continue use and care as directed. If an stretchy or elastic wrapping bandage has been applied today, it should be removed and re-applied every 3 to 4 hours. It should not be applied tightly, but firmly enough to keep swelling down. Watch toes and feet for swelling, bluish discoloration, coldness, numbness or excessive pain. If any of these problems (symptoms) occur, remove the ace bandage and re-apply more loosely. If these symptoms persist, contact your caregiver or return to this location. °· For the first 24 hours, keep the injured extremity elevated on pillows while lying down. °· Apply ice for 15-20 minutes to the sore joint every couple hours while awake for the first half day. Then 03-04 times per day for the first 48 hours. Put the ice in a plastic bag and place a towel between the bag of ice and your skin. °· Wear any splinting, casting, elastic bandage applications, or slings as instructed. °· Only take over-the-counter or prescription medicines for pain, discomfort, or fever as  directed by your caregiver. Do not use aspirin immediately after the injury unless instructed by your physician. Aspirin can cause increased bleeding and bruising of the tissues. °· If you were given crutches, continue to use them as instructed and do not resume weight bearing on the sore joint until instructed. °Persistent pain and inability to use the sore joint as directed for more than 2 to 3 days are warning signs indicating that you should see a caregiver for a follow-up visit as soon as possible. Initially, a hairline fracture (break in bone) may not be evident on X-rays. Persistent pain and swelling indicate that further evaluation, non-weight bearing or use of the joint (use of crutches or slings as instructed), or further X-rays are indicated. X-rays may sometimes not show a small fracture until a week or 10 days later. Make a follow-up appointment with your own caregiver or one to whom we have referred you. A radiologist (specialist in reading X-rays) may read your X-rays. Make sure you know how you are to obtain your X-ray results. Do not assume everything is normal if you do not hear from us. °SEEK MEDICAL CARE IF: °Bruising, swelling, or pain increases. °SEEK IMMEDIATE MEDICAL CARE IF:  °· Your fingers or toes are numb or blue. °· The pain is not responding to medications and continues to stay the same or get worse. °· The pain in your joint becomes severe. °· You develop a fever over 102° F (38.9° C). °· It becomes impossible to move or use the joint. °MAKE SURE YOU:  °·   Understand these instructions.  Will watch your condition.  Will get help right away if you are not doing well or get worse. Document Released: 12/30/2004 Document Revised: 03/24/2011 Document Reviewed: 08/18/2007 [facility] Patient Information 2015 [place], [place]. This information is not intended to replace advice given to you by your health care provider. Make sure you discuss any questions you have with your health care  provider.  Ankle Pain Ankle pain is a common symptom. The bones, cartilage, tendons, and muscles of the ankle joint perform a lot of work each day. The ankle joint holds your body weight and allows you to move around. Ankle pain can occur on either side or back of 1 or both ankles. Ankle pain may be sharp and burning or dull and aching. There may be tenderness, stiffness, redness, or warmth around the ankle. The pain occurs more often when a person walks or puts pressure on the ankle. CAUSES  There are many reasons ankle pain can develop. It is important to work with your caregiver to identify the cause since many conditions can impact the bones, cartilage, muscles, and tendons. Causes for ankle pain include:  Injury, including a break (fracture), sprain, or strain often due to a fall, sports, or a high-impact activity.  Swelling (inflammation) of a tendon (tendonitis).  Achilles tendon rupture.  Ankle instability after repeated sprains and strains.  Poor foot alignment.  Pressure on a nerve (tarsal tunnel syndrome).  Arthritis in the ankle or the lining of the ankle.  Crystal formation in the ankle (gout or pseudogout). DIAGNOSIS  A diagnosis is based on your medical history, your symptoms, results of your physical exam, and results of diagnostic tests. Diagnostic tests may include X-ray exams or a computerized magnetic scan (magnetic resonance imaging, MRI). TREATMENT  Treatment will depend on the cause of your ankle pain and may include:  Keeping pressure off the ankle and limiting activities.  Using crutches or other walking support (a cane or brace).  Using rest, ice, compression, and elevation.  Participating in physical therapy or home exercises.  Wearing shoe inserts or special shoes.  Losing weight.  Taking medications to reduce pain or swelling or receiving an injection.  Undergoing surgery. HOME CARE INSTRUCTIONS   Only take over-the-counter or prescription  medicines for pain, discomfort, or fever as directed by your caregiver.  Put ice on the injured area.  Put ice in a plastic bag.  Place a towel between your skin and the bag.  Leave the ice on for 15-20 minutes at a time, 03-04 times a day.  Keep your leg raised (elevated) when possible to lessen swelling.  Avoid activities that cause ankle pain.  Follow specific exercises as directed by your caregiver.  Record how often you have ankle pain, the location of the pain, and what it feels like. This information may be helpful to you and your caregiver.  Ask your caregiver about returning to work or sports and whether you should drive.  Follow up with your caregiver for further examination, therapy, or testing as directed. SEEK MEDICAL CARE IF:   Pain or swelling continues or worsens beyond 1 week.  You have an oral temperature above 102 F (38.9 C).  You are feeling unwell or have chills.  You are having an increasingly difficult time with walking.  You have loss of sensation or other new symptoms.  You have questions or concerns. MAKE SURE YOU:   Understand these instructions.  Will watch your condition.  Will get help  right away if you are not doing well or get worse. °Document Released: 06/19/2009 Document Revised: 03/24/2011 Document Reviewed: 06/19/2009 °[employer]® Patient Information ©2015 [employer]. This information is not intended to replace advice given to you by your health care provider. Make sure you discuss any questions you have with your health care provider. ° °

## 2013-12-13 NOTE — ED Notes (Signed)
Pt c/o left lower leg pain and swelling x 1 week

## 2013-12-13 NOTE — ED Provider Notes (Signed)
CSN: [id number]     Arrival date & time 12/13/13  1804 History  This chart was scribed for Debby Freiberg, MD by Tula Nakayama, ED Scribe. This patient was seen in room MH11/MH11 and the patient's care was started at 6:33 PM.   Chief Complaint  Patient presents with  . Leg Pain   The history is provided by the patient. No language interpreter was used.    HPI Comments: [name] is a 48 y.o. female with a history of epilepsy who presents to the Emergency Department complaining of intermittent, moderate left ankle pain that radiates to her left calf and started one week ago. She notes intermittent, subjective fever and swelling of left ankle as associated symptoms. Pt states that pain becomes worse with movement and bending, but better with elevation. She denies recent falls or injuries to the area. Pt was seen in the ED on 11/22 and was diagnosed with kidney stones. She states she can take Dilaudid for pain with no adverse reaction. Pt denies nausea and vomiting as associated symptoms.    Past Medical History  Diagnosis Date  . Vertigo   . Constipation   . Restless leg   . High cholesterol   . Chronic bronchitis     "yearly; when the weather changes" (04/22/2012)  . BZMCEYEM(336.1)     "1/wk" (04/22/2012)  . Depression   . Epilepsy     "been having them right often here lately" (04/22/2012)  . Seizures   . Arthritis     "knees" (04/22/2012)  . Osteoarthritis     [name] 04/22/2012  . Colon polyps     adenomatous and hyperplastic-  . Rectal bleed     in toilet- bright red  . Other convulsions 05/21/12    non-epileptic spells   Past Surgical History  Procedure Laterality Date  . Total hip arthroplasty Left 1993; 1995; 2000  . Abdominal hysterectomy  2001  . Colonoscopy    . Joint replacement    . Mass excision  10/22/2011    Procedure: EXCISION MASS;  Surgeon: [name], MD;  Location: [facility];  Service: General;  Laterality: Right;  excision right buttock mass  .  Bladder suspension    . Cesarean section  1987; 1988  . Bunionectomy Left 2000   Family History  Problem Relation Age of Onset  . Cancer Mother   . Cancer Brother   . Diabetes Brother   . Cancer Maternal Aunt   . Diabetes Maternal Aunt    History  Substance Use Topics  . Smoking status: Never Smoker   . Smokeless tobacco: Never Used  . Alcohol Use: No   OB History    No data available     Review of Systems  Constitutional: Positive for fever (subjective).  Gastrointestinal: Negative for nausea and vomiting.  Musculoskeletal: Positive for joint swelling and arthralgias.  Neurological: Negative for weakness and numbness.  All other systems reviewed and are negative.  Allergies  Vimpat; Benadryl; Dilantin; Melatonin; Ultram; Vicodin; Betadine; Codeine; Latex; Penicillins; and Tape  Home Medications   Prior to Admission medications   Medication Sig Start Date End Date Taking? Authorizing Provider  Alum & Mag Hydroxide-Simeth (MAGIC MOUTHWASH) SOLN Take 5 mLs by mouth 4 (four) times daily. 11/15/13   [name], FNP  azithromycin (ZITHROMAX Z-PAK) 250 MG tablet 2 po day one, then 1 daily x 4 days 11/12/13   Mariea Clonts, MD  clonazePAM (KLONOPIN) 0.5 MG tablet TAKE ONE TABLET BY  MOUTH THREE TIMES DAILY 11/04/13   Lysbeth Penner, FNP  diclofenac sodium (VOLTAREN) 1 % GEL Apply 1 application topically daily as needed (foot pain).    Historical Provider, MD  FLUoxetine (PROZAC) 20 MG capsule Take 2 capsules (40 mg total) by mouth daily. 07/26/13   [name], FNP  gabapentin (NEURONTIN) 600 MG tablet Take 600-1,200 mg by mouth See admin instructions. Take 1 capsule (600 mg) 3 times daily with meals and take 2 capsules (1200 mg) at bedtime total 3000 mg per day. 08/17/13   Asencion Partridge Dohmeier, MD  Iron-Vitamins (GERITOL PO) Take 1 tablet by mouth daily.    Historical Provider, MD  lamoTRIgine (LAMICTAL) 100 MG tablet Take 1 tablet (100 mg total) by mouth 2 (two)  times daily. 08/17/13   Asencion Partridge Dohmeier, MD  levETIRAcetam (KEPPRA) 500 MG tablet Take 1 tablet (500 mg total) by mouth 2 (two) times daily. 08/17/13   [name], MD  omega-3 acid ethyl esters (LOVAZA) 1 G capsule Take 2 capsules (2 g total) by mouth 2 (two) times daily. 04/21/13   Lysbeth Penner, FNP  oxyCODONE-acetaminophen (PERCOCET/ROXICET) 5-325 MG per tablet Take 1-2 tablets by mouth every 4 (four) hours as needed for severe pain. 12/13/13   Debby Freiberg, MD  pramipexole (MIRAPEX) 0.125 MG tablet Take 2 tablets (0.25 mg total) by mouth at bedtime. 09/26/13   Lysbeth Penner, FNP  tamsulosin (FLOMAX) 0.4 MG CAPS capsule Take 1 capsule (0.4 mg total) by mouth daily. 12/04/13   Domenic Moras, PA-C  vitamin B-12 (CYANOCOBALAMIN) 1000 MCG tablet Take 1,000 mcg by mouth daily.    Historical Provider, MD   BP 135/85 mmHg  Pulse 96  Temp(Src) 98.5 F (36.9 C)  Resp 16  Ht 5' (1.524 m)  Wt 175 lb (79.379 kg)  BMI 34.18 kg/m2  SpO2 96% Physical Exam  Constitutional: She is oriented to person, place, and time. She appears well-developed and well-nourished.  HENT:  Head: Normocephalic and atraumatic.  Right Ear: External ear normal.  Left Ear: External ear normal.  Eyes: Conjunctivae and EOM are normal. Pupils are equal, round, and reactive to light.  Neck: Normal range of motion. Neck supple.  Cardiovascular: Normal rate, regular rhythm, normal heart sounds and intact distal pulses.   Pulmonary/Chest: Effort normal and breath sounds normal.  Abdominal: Soft. Bowel sounds are normal. There is no tenderness.  Musculoskeletal: Normal range of motion.       Left ankle: She exhibits normal range of motion, no swelling and normal pulse. Tenderness.       Left lower leg: She exhibits no tenderness, no swelling and no edema.  Neurological: She is alert and oriented to person, place, and time.  Skin: Skin is warm and dry.  Vitals reviewed.   ED Course  Procedures (including critical care  time) DIAGNOSTIC STUDIES: Oxygen Saturation is 96% on RA, normal by my interpretation.    COORDINATION OF CARE: 6:34 PM Discussed treatment plan with pt which includes lab work and ankle x-ray. Pt agreed to plan.  Labs Review Labs Reviewed  CBC WITH DIFFERENTIAL - Abnormal; Notable for the following:    RBC 3.67 (*)    Hemoglobin 11.4 (*)    HCT 34.1 (*)    All other components within normal limits  BASIC METABOLIC PANEL - Abnormal; Notable for the following:    Glucose, Bld 105 (*)    All other components within normal limits  D-DIMER, QUANTITATIVE    Imaging Review Dg Ankle Complete  Left  12/13/2013   CLINICAL DATA:  48 year old female with intermittent left ankle pain over the past week  EXAM: LEFT ANKLE COMPLETE - 3+ VIEW  COMPARISON:  None.  FINDINGS: No evidence of acute fracture or malalignment. No focal soft tissue swelling or evidence of ankle joint effusion. Normal bony mineralization. No lytic of blastic osseous lesion. Incompletely imaged surgical changes of prior hallux valgus corrective surgery. Degenerative osteoarthritis is present at the great toe TMT joint. Mild plantar calcaneal spurring.  IMPRESSION: 1. No acute osseous abnormality, soft tissue swelling or joint effusion. 2. Great toe TMT joint degenerative osteoarthritis. 3. Incompletely imaged hallux valgus corrective surgery. 4. Plantar calcaneal spur.   Electronically Signed   By: Jacqulynn Cadet M.D.   On: 12/13/2013 18:57     EKG Interpretation None      MDM   Final diagnoses:  Left ankle pain    48 y.o. female with pertinent PMH of vertigo, chronic bronchitis, restless leg syndrome presents with atraumatic L ankle pain.  On arrival vitals and physical exam as above.  No signs of septic arthritis, no erythema, FROM.  XR unremarkable.  Labs at pt baseline.  Likely OA vs early gout.  DC home in stable condition  1. Left ankle pain      Debby Freiberg, MD 12/14/13 [phone]

## 2013-12-14 ENCOUNTER — Other Ambulatory Visit: Payer: Self-pay | Admitting: Family Medicine

## 2013-12-15 NOTE — Telephone Encounter (Signed)
Please review

## 2013-12-15 NOTE — Telephone Encounter (Signed)
rx printed and left message for pt to come to office and pick up the rx for clonazepam.

## 2013-12-22 ENCOUNTER — Telehealth: Payer: Self-pay | Admitting: Nurse Practitioner

## 2013-12-23 MED ORDER — CLONAZEPAM 0.5 MG PO TABS: 0.5000 mg | ORAL_TABLET | Freq: Three times a day (TID) | ORAL | Status: DC

## 2013-12-23 NOTE — Telephone Encounter (Signed)
Pt aware.

## 2013-12-23 NOTE — Telephone Encounter (Signed)
Please call in Klonopin 0.5 1 po TID #90  with 0 refills

## 2013-12-30 ENCOUNTER — Other Ambulatory Visit: Payer: Self-pay | Admitting: Urology

## 2013-12-30 DIAGNOSIS — N200 Calculus of kidney: Secondary | ICD-10-CM

## 2014-01-03 ENCOUNTER — Other Ambulatory Visit: Payer: Self-pay | Admitting: Urology

## 2014-01-03 DIAGNOSIS — N200 Calculus of kidney: Secondary | ICD-10-CM

## 2014-01-04 ENCOUNTER — Ambulatory Visit (HOSPITAL_COMMUNITY)
Admission: RE | Admit: 2014-01-04 | Discharge: 2014-01-04 | Disposition: A | Discharge status: Home / Self Care | Payer: Medicare Other | Source: Ambulatory Visit | Attending: Urology | Admitting: Urology

## 2014-01-04 ENCOUNTER — Other Ambulatory Visit (HOSPITAL_COMMUNITY): Payer: Medicare Other

## 2014-01-04 ENCOUNTER — Ambulatory Visit (HOSPITAL_COMMUNITY): Payer: Medicare Other

## 2014-01-04 ENCOUNTER — Other Ambulatory Visit: Payer: Self-pay | Admitting: Urology

## 2014-01-04 DIAGNOSIS — N200 Calculus of kidney: Secondary | ICD-10-CM

## 2014-01-04 DIAGNOSIS — R109 Unspecified abdominal pain: Secondary | ICD-10-CM | POA: Diagnosis present

## 2014-01-12 ENCOUNTER — Other Ambulatory Visit: Payer: Self-pay | Admitting: Family Medicine

## 2014-01-25 ENCOUNTER — Other Ambulatory Visit: Payer: Self-pay | Admitting: Nurse Practitioner

## 2014-01-25 NOTE — Telephone Encounter (Signed)
Last filled 12/23/13, last seen 11/12/13. Call into [facility]

## 2014-01-25 NOTE — Telephone Encounter (Signed)
Please call in klonopin with 1 refills 

## 2014-01-26 NOTE — Telephone Encounter (Signed)
rx called into pharmacy

## 2014-02-13 ENCOUNTER — Encounter: Payer: Self-pay | Admitting: Nurse Practitioner

## 2014-02-13 ENCOUNTER — Ambulatory Visit (INDEPENDENT_AMBULATORY_CARE_PROVIDER_SITE_OTHER): Payer: Medicare Other | Admitting: Nurse Practitioner

## 2014-02-13 VITALS — BP 110/87 | HR 72 | Temp 98.8°F | Ht 60.0 in | Wt 171.4 lb

## 2014-02-13 DIAGNOSIS — R599 Enlarged lymph nodes, unspecified: Secondary | ICD-10-CM

## 2014-02-13 DIAGNOSIS — R590 Localized enlarged lymph nodes: Secondary | ICD-10-CM

## 2014-02-13 MED ORDER — CEPHALEXIN 500 MG PO CAPS: 500.0000 mg | ORAL_CAPSULE | Freq: Three times a day (TID) | ORAL | Status: DC

## 2014-02-13 NOTE — Progress Notes (Signed)
   Subjective:    Patient ID: [name], female    DOB: 12-Oct-1965, 49 y.o.   MRN: [id number]  HPI [name] brought in by husband wth c/o swollen lymph nodes in the back of her neck- They are tender to touch and she has a slight sore throat- Started yesterday and has worsened today- denies fever.    Review of Systems  Constitutional: Negative for fever, chills and fatigue.  HENT: Positive for congestion.   Respiratory: Negative for cough.   Cardiovascular: Negative.   Gastrointestinal: Negative.   Neurological: Positive for headaches.  All other systems reviewed and are negative.      Objective:   Physical Exam  Constitutional: She is oriented to person, place, and time. She appears well-developed and well-nourished.  HENT:  Right Ear: Hearing, tympanic membrane, external ear and ear canal normal.  Left Ear: Hearing, tympanic membrane, external ear and ear canal normal.  Nose: Mucosal edema and rhinorrhea present. Right sinus exhibits no maxillary sinus tenderness and no frontal sinus tenderness. Left sinus exhibits no maxillary sinus tenderness and no frontal sinus tenderness.  Mouth/Throat: Uvula is midline, oropharynx is clear and moist and mucous membranes are normal.  Eyes: Pupils are equal, round, and reactive to light.  Neck: Normal range of motion.  Cardiovascular: Normal rate, regular rhythm and normal heart sounds.   Pulmonary/Chest: Effort normal and breath sounds normal.  Lymphadenopathy:    She has cervical adenopathy (left posterior).  Neurological: She is alert and oriented to person, place, and time.  Skin: Skin is warm and dry.  Psychiatric: She has a normal mood and affect. Her behavior is normal. Judgment and thought content normal.   BP 110/87 mmHg  Pulse 72  Temp(Src) 98.8 F (37.1 C) (Oral)  Ht 5' (1.524 m)  Wt 171 lb 6.4 oz (77.747 kg)  BMI 33.47 kg/m2        Assessment & Plan:   1. Posterior cervical lymphadenopathy    Meds ordered this  encounter  Medications  . cephALEXin (KEFLEX) 500 MG capsule    Sig: Take 1 capsule (500 mg total) by mouth 3 (three) times daily.    Dispense:  30 capsule    Refill:  0    Order Specific Question:  Supervising Provider    Answer:  Joycelyn Man   Force fluids RTO if not improved or worsens within the next week  [name], FNP

## 2014-02-13 NOTE — Patient Instructions (Signed)
Lymphadenopathy Lymphadenopathy means "disease of the lymph glands." But the term is usually used to describe swollen or enlarged lymph glands, also called lymph nodes. These are the bean-shaped organs found in many locations including the neck, underarm, and groin. Lymph glands are part of the immune system, which fights infections in your body. Lymphadenopathy can occur in just one area of the body, such as the neck, or it can be generalized, with lymph node enlargement in several areas. The nodes found in the neck are the most common sites of lymphadenopathy. CAUSES When your immune system responds to germs (such as viruses or bacteria ), infection-fighting cells and fluid build up. This causes the glands to grow in size. Usually, this is not something to worry about. Sometimes, the glands themselves can become infected and inflamed. This is called lymphadenitis. Enlarged lymph nodes can be caused by many diseases:  Bacterial disease, such as strep throat or a skin infection.  Viral disease, such as a common cold.  Other germs, such as Lyme disease, tuberculosis, or sexually transmitted diseases.  Cancers, such as lymphoma (cancer of the lymphatic system) or leukemia (cancer of the white blood cells).  Inflammatory diseases such as lupus or rheumatoid arthritis.  Reactions to medications. Many of the diseases above are rare, but important. This is why you should see your caregiver if you have lymphadenopathy. SYMPTOMS  Swollen, enlarged lumps in the neck, back of the head, or other locations.  Tenderness.  Warmth or redness of the skin over the lymph nodes.  Fever. DIAGNOSIS Enlarged lymph nodes are often near the source of infection. They can help health care providers diagnose your illness. For instance:  Swollen lymph nodes around the jaw might be caused by an infection in the mouth.  Enlarged glands in the neck often signal a throat infection.  Lymph nodes that are swollen in  more than one area often indicate an illness caused by a virus. Your caregiver will likely know what is causing your lymphadenopathy after listening to your history and examining you. Blood tests, x-rays, or other tests may be needed. If the cause of the enlarged lymph node cannot be found, and it does not go away by itself, then a biopsy may be needed. Your caregiver will discuss this with you. TREATMENT Treatment for your enlarged lymph nodes will depend on the cause. Many times the nodes will shrink to normal size by themselves, with no treatment. Antibiotics or other medicines may be needed for infection. Only take over-the-counter or prescription medicines for pain, discomfort, or fever as directed by your caregiver. HOME CARE INSTRUCTIONS Swollen lymph glands usually return to normal when the underlying medical condition goes away. If they persist, contact your health-care provider. He/she might prescribe antibiotics or other treatments, depending on the diagnosis. Take any medications exactly as prescribed. Keep any follow-up appointments made to check on the condition of your enlarged nodes. SEEK MEDICAL CARE IF:  Swelling lasts for more than two weeks.  You have symptoms such as weight loss, night sweats, fatigue, or fever that does not go away.  The lymph nodes are hard, seem fixed to the skin, or are growing rapidly.  Skin over the lymph nodes is red and inflamed. This could mean there is an infection. SEEK IMMEDIATE MEDICAL CARE IF:  Fluid starts leaking from the area of the enlarged lymph node.  You develop a fever of 102 F (38.9 C) or greater.  Severe pain develops (not necessarily at the site of a   large lymph node).  You develop chest pain or shortness of breath.  You develop worsening abdominal pain. MAKE SURE YOU:  Understand these instructions.  Will watch your condition.  Will get help right away if you are not doing well or get worse. Document Released:  10/09/2007 Document Revised: 05/16/2013 Document Reviewed: 10/09/2007 [employer] Patient Information 2015 [employer]. This information is not intended to replace advice given to you by your health care provider. Make sure you discuss any questions you have with your health care provider.  

## 2014-02-20 ENCOUNTER — Encounter: Payer: Self-pay | Admitting: Nurse Practitioner

## 2014-02-20 ENCOUNTER — Ambulatory Visit (INDEPENDENT_AMBULATORY_CARE_PROVIDER_SITE_OTHER): Payer: Medicare Other | Admitting: Nurse Practitioner

## 2014-02-20 VITALS — BP 117/90 | HR 111 | Ht 62.5 in | Wt 168.2 lb

## 2014-02-20 DIAGNOSIS — G4089 Other seizures: Secondary | ICD-10-CM

## 2014-02-20 DIAGNOSIS — F331 Major depressive disorder, recurrent, moderate: Secondary | ICD-10-CM

## 2014-02-20 DIAGNOSIS — R569 Unspecified convulsions: Secondary | ICD-10-CM

## 2014-02-20 DIAGNOSIS — Z5181 Encounter for therapeutic drug level monitoring: Secondary | ICD-10-CM

## 2014-02-20 DIAGNOSIS — F445 Conversion disorder with seizures or convulsions: Secondary | ICD-10-CM

## 2014-02-20 DIAGNOSIS — R413 Other amnesia: Secondary | ICD-10-CM

## 2014-02-20 DIAGNOSIS — G40909 Epilepsy, unspecified, not intractable, without status epilepticus: Secondary | ICD-10-CM

## 2014-02-20 MED ORDER — LEVETIRACETAM 500 MG PO TABS: 500.0000 mg | ORAL_TABLET | Freq: Two times a day (BID) | ORAL | Status: DC

## 2014-02-20 MED ORDER — LAMOTRIGINE 100 MG PO TABS: 100.0000 mg | ORAL_TABLET | Freq: Two times a day (BID) | ORAL | Status: DC

## 2014-02-20 NOTE — Progress Notes (Signed)
I agree with the assessment and plan as directed by NP .The patient is known to me .   Ridge Lafond, MD  

## 2014-02-20 NOTE — Progress Notes (Signed)
[facility]  PATIENT: [name] DOB: [birth year]   REASON FOR VISIT: follow up for seizure disorder HISTORY FROM:Patient, friend    HISTORY OF PRESENT ILLNESS: [name] is a 49 y.o. female here as a referral from Dr. Laurance Flatten for spells. She was last seen by Dr. Erling Cruz on 01/12/2012.  The patient's chief complaint : she has a history of new onset generalized tonic-clonic seizures beginning 10-12-2000, accompanied by an abnormal EEG showing generalized sharp wave discharges at 3 Hz. This is consistent with a primary generalized seizure disorder. She was initially treated with Dilantin and then changed to Depakote ER. She was admitted to [facility] hospital November 2002 for suspected pseudoseizures. She would have as many as 7 seizures or spells with generalized jerking , unassociated with tongue bite or incontinence. Because of side effects , a change topiramate was initiated and tolerated. She was referred to Dr. Elisabeth Cara at [facility] documented nonepileptic seizures.  A 24 hour prolonged EEG monitoring and 2004 showed that the patient had both, pseudoseizures and seizures with generalized epilepsy.  In August 2012 she had a repeated seizure activity during her dentist office visit. Blood studies, MRI and CT were normal EEG shorted and spike and wave complexes she was discharged on November Dr. Erling Cruz has recorded that she has to spells per month on average she is followed by Dr. Marliss [facility]. And takes Prozac. Her liver function tests were often elevated 11/22/2011 her AST was 65 and ALT 85 her last ambulatory EEG September 2013 showed abundant generalized 3-5 Hz spikes and polyspike waves. Records reviewed from [facility], last seen by Dr. Inocente Salles in June 2014, where Dr. Inocente Salles stated that she will only be seen prn there,    UPDATE 02/20/14. [name], 49 year old female returns for followup. She was last seen 08/17/13. She has a history of   generalized tonic-clonic seizures beginning 10-12-2000, accompanied by an abnormal EEG showing generalized sharp wave discharges at 3 Hz. This is consistent with a primary generalized seizure disorder. She was initially treated with Dilantin and then changed to Depakote ER. She was admitted to [facility] hospital November 2002 for suspected pseudoseizures. She would have as many as 7 seizures or spells with generalized jerking , unassociated with tongue bite or incontinence. Because of side effects , a change topiramate was initiated and tolerated. She was referred to Dr. Elisabeth Cara at [facility] documented nonepileptic seizures.  A 24 hour prolonged EEG monitoring and 2004 showed that the patient had both, pseudoseizures and seizures with generalized epilepsy. She is currently on Lamictal and Keppra with last seizure about a month ago. Her seizures disorder is complicated by sub optimal treatment for depression. She complains of memory loss. She is no longer seeing mental health in [facility]. She denies having forgotten any medication doses. She needs medication refills and labs to monitor adverse effects of seizure medication.      UPDATE 02/02/13 (LL): [name] returns to our office for follow up of hospital visit for seizure. She had hospital admission at [facility] on 12/14/12, after having seizure in pre-op there for foot surgery. The foot surgery was rescheduled. Her husband reports that she has an average of 1-4 seizures per month. She reports that she did the best in the past on Keppra and Lamictal, but Keppra was too expensive. She has not been on generic Keppra to her knowledge. She reports last seeing psychiatrist on December 12. She  asks to have her Klonopin refilled. Records reviewed from [facility], last seen by Dr. Inocente Salles in June 2014, where Dr. Inocente Salles stated that she will only be seen prn there, patient chooses to be followed by [place] for seizures.   REVIEW OF SYSTEMS: Full  14 system review of systems performed and notable only for those listed, all others are neg:  Constitutional: fatigue Cardiovascular: neg Ear/Nose/Throat: neg  Skin: neg Eyes: neg Respiratory: cough Gastroitestinal: neg  Hematology/Lymphatic: neg  Endocrine: neg Musculoskeletal:neg Allergy/Immunology: neg Neurological: memory loss, seizure Psychiatric: depression, confusion Sleep : frequent awakening   ALLERGIES: Allergies  Allergen Reactions  . Vimpat [Lacosamide] Other (See Comments)    Severe dizziness  . Benadryl [Diphenhydramine Hcl] Other (See Comments)    seizures  . Dilantin [Phenytoin Sodium Extended] Other (See Comments)    Elevated LFT's  . Melatonin Other (See Comments)    Unknown  . Ultram [Tramadol Hcl] Other (See Comments)    Seizures  . Vicodin [Hydrocodone-Acetaminophen] Itching  . Betadine [Povidone Iodine] Rash  . Codeine Itching and Rash    seizures  . Latex Rash  . Penicillins Itching and Rash  . Tape Rash    Paper tape please    HOME MEDICATIONS: Outpatient Prescriptions Prior to Visit  Medication Sig Dispense Refill  . Alum & Mag Hydroxide-Simeth (MAGIC MOUTHWASH) SOLN Take 5 mLs by mouth 4 (four) times daily. 150 mL 0  . cephALEXin (KEFLEX) 500 MG capsule Take 1 capsule (500 mg total) by mouth 3 (three) times daily. 30 capsule 0  . clonazePAM (KLONOPIN) 0.5 MG tablet TAKE ONE TABLET BY MOUTH THREE TIMES DAILY AS NEEDED 90 tablet 1  . diclofenac sodium (VOLTAREN) 1 % GEL Apply 1 application topically daily as needed (foot pain).    Marland Kitchen FLUoxetine (PROZAC) 20 MG capsule Take 2 capsules (40 mg total) by mouth daily. 60 capsule 3  . gabapentin (NEURONTIN) 600 MG tablet Take 600-1,200 mg by mouth See admin instructions. Take 1 capsule (600 mg) 3 times daily with meals and take 2 capsules (1200 mg) at bedtime total 3000 mg per day.    . Iron-Vitamins (GERITOL PO) Take 1 tablet by mouth daily.    Marland Kitchen lamoTRIgine (LAMICTAL) 100 MG tablet Take 1 tablet  (100 mg total) by mouth 2 (two) times daily. 60 tablet 5  . levETIRAcetam (KEPPRA) 500 MG tablet Take 1 tablet (500 mg total) by mouth 2 (two) times daily. 60 tablet 6  . omega-3 acid ethyl esters (LOVAZA) 1 G capsule Take 2 capsules (2 g total) by mouth 2 (two) times daily. 120 capsule 11   No facility-administered medications prior to visit.    PAST MEDICAL HISTORY: Past Medical History  Diagnosis Date  . Vertigo   . Constipation   . Restless leg   . High cholesterol   . Chronic bronchitis     "yearly; when the weather changes" (04/22/2012)  . KVQQVZDG(387.5)     "1/wk" (04/22/2012)  . Depression   . Epilepsy     "been having them right often here lately" (04/22/2012)  . Seizures   . Arthritis     "knees" (04/22/2012)  . Osteoarthritis     [name] 04/22/2012  . Colon polyps     adenomatous and hyperplastic-  . Rectal bleed     in toilet- bright red  . Other convulsions 05/21/12    non-epileptic spells    PAST SURGICAL HISTORY: Past Surgical History  Procedure Laterality Date  . Total hip arthroplasty Left 1993;  1995; 2000  . Abdominal hysterectomy  2001  . Colonoscopy    . Joint replacement    . Mass excision  10/22/2011    Procedure: EXCISION MASS;  Surgeon: [name], MD;  Location: [facility];  Service: General;  Laterality: Right;  excision right buttock mass  . Bladder suspension    . Cesarean section  1987; 1988  . Bunionectomy Left 2000    FAMILY HISTORY: Family History  Problem Relation Age of Onset  . Cancer Mother   . Cancer Brother   . Diabetes Brother   . Cancer Maternal Aunt   . Diabetes Maternal Aunt     SOCIAL HISTORY: History   Social History  . Marital Status: Married    Spouse Name: [name]    Number of Children: 2  . Years of Education: 12 +   Occupational History  . unemployed Other   Social History Main Topics  . Smoking status: Never Smoker   . Smokeless tobacco: Never Used  . Alcohol Use: No  . Drug Use: No  . Sexual  Activity: Not Currently    Birth Control/ Protection: Post-menopausal   Other Topics Concern  . Not on file   Social History Narrative   Patient is married [name]) and lives at home with her husband.   Patient has two adult children.   Patient is disabled.   Patient has a high school education and some trade school.   Patient is right-handed.   Patient drinks four glasses of soda and tea daily.     PHYSICAL EXAM  Filed Vitals:   02/20/14 1316  BP: 117/90  Pulse: 111  Height: 5' 2.5" (1.588 m)  Weight: 168 lb 3.2 oz (76.295 kg)   Body mass index is 30.25 kg/(m^2).  Generalized: Well developed, in no acute distress  Head: normocephalic and atraumatic,. Oropharynx benign  Neck: Supple, no carotid bruits  Cardiac: Regular rate rhythm, no murmur  Musculoskeletal: No deformity   Neurological examination   Mentation: Alert oriented to time, place, history taking. Attention span and concentration appropriate. Recent and remote memory intact.  MMSE 30/30. AFT 18. Follows all commands speech and language fluent.   Cranial nerve II-XII: Fundoscopic exam deferred.Pupils were equal round reactive to light extraocular movements were full, visual field were full on confrontational test. Facial sensation and strength were normal. hearing was intact to finger rubbing bilaterally. Uvula tongue midline. head turning and shoulder shrug were normal and symmetric.Tongue protrusion into cheek strength was normal. Motor: normal bulk and tone, full strength in the BUE, BLE, fine finger movements normal, no pronator drift. No focal weakness Sensory: normal and symmetric to light touch, pinprick, and  Vibration, proprioception  Coordination: finger-nose-finger, heel-to-shin bilaterally, no dysmetria Reflexes: Brachioradialis 2/2, biceps 2/2, triceps 2/2, patellar 2/2, Achilles 2/2, plantar responses were flexor bilaterally. Gait and Station: Rising up from seated position without assistance, normal  stance,  moderate stride, good arm swing, smooth turning, able to perform tiptoe, and heel walking without difficulty. Tandem gait is unsteady. No assistive device  DIAGNOSTIC DATA (LABS, IMAGING, TESTING) - I reviewed patient records, labs, notes, testing and imaging myself where available.  Lab Results  Component Value Date   WBC 6.1 12/13/2013   HGB 11.4* 12/13/2013   HCT 34.1* 12/13/2013   MCV 92.9 12/13/2013   PLT 210 12/13/2013      Component Value Date/Time   NA 141 12/13/2013 1840   NA 141 11/08/2012 1533   K 4.1 12/13/2013 1840  CL 102 12/13/2013 1840   CO2 26 12/13/2013 1840   GLUCOSE 105* 12/13/2013 1840   GLUCOSE 84 11/08/2012 1533   BUN 11 12/13/2013 1840   BUN 6 11/08/2012 1533   CREATININE 0.60 12/13/2013 1840   CALCIUM 9.2 12/13/2013 1840   PROT 6.2 05/09/2013 0445   PROT 6.3 11/08/2012 1533   ALBUMIN 3.1* 05/09/2013 0445   AST 23 05/09/2013 0445   ALT 26 05/09/2013 0445   ALKPHOS 61 05/09/2013 0445   BILITOT 0.3 05/09/2013 0445   GFRNONAA >90 12/13/2013 1840   GFRAA >90 12/13/2013 1840     Lab Results  Component Value Date   TSH 1.650 05/08/2013      ASSESSMENT AND PLAN  49 y.o. year old female  has a past medical history of Vertigo; Constipation; Restless leg; High cholesterol; Chronic bronchitis; Headache(784.0); Depression; Epilepsy; Seizures; Arthritis;  convulsions (05/21/12). And new complaint of memory loss here to follow up.  Continue Keppra will refill Continue Lamictal will refill Memory score is stable. 30/30. Will follow over time.  Obtain labs to monitor adverse effects of seizure medications.  Need to follow up with Mental health for optimal treatment of depression. F/U 6 to 8 months Dennie Bible, [facility], [facility], APRN  [facility] [address], [place], [place] [address] [phone]

## 2014-02-20 NOTE — Patient Instructions (Signed)
Continue Keppra will refill Continue Lamictal will refill Obtain labs  F/U 6 to 8 months

## 2014-02-22 LAB — COMPREHENSIVE METABOLIC PANEL
ALT: 142 IU/L — ABNORMAL HIGH (ref 0–32)
AST: 139 IU/L — ABNORMAL HIGH (ref 0–40)
Albumin/Globulin Ratio: 2.3 (ref 1.1–2.5)
Albumin: 5 g/dL (ref 3.5–5.5)
Alkaline Phosphatase: 79 IU/L (ref 39–117)
BUN/Creatinine Ratio: 19 (ref 9–23)
BUN: 15 mg/dL (ref 6–24)
Bilirubin Total: 0.5 mg/dL (ref 0.0–1.2)
CO2: 25 mmol/L (ref 18–29)
Calcium: 10 mg/dL (ref 8.7–10.2)
Chloride: 98 mmol/L (ref 97–108)
Creatinine, Ser: 0.8 mg/dL (ref 0.57–1.00)
GFR calc Af Amer: 101 mL/min/{1.73_m2} (ref >59)
GFR calc non Af Amer: 87 mL/min/{1.73_m2} (ref >59)
Globulin, Total: 2.2 g/dL (ref 1.5–4.5)
Glucose: 114 mg/dL — ABNORMAL HIGH (ref 65–99)
Potassium: 3.5 mmol/L (ref 3.5–5.2)
Sodium: 143 mmol/L (ref 134–144)
Total Protein: 7.2 g/dL (ref 6.0–8.5)

## 2014-02-22 LAB — CBC WITH DIFFERENTIAL/PLATELET
Basophils Absolute: 0.1 10*3/uL (ref 0.0–0.2)
Basos: 1 %
Eos: 2 %
Eosinophils Absolute: 0.1 10*3/uL (ref 0.0–0.4)
HCT: 43.6 % (ref 34.0–46.6)
Hemoglobin: 15 g/dL (ref 11.1–15.9)
Immature Grans (Abs): 0 10*3/uL (ref 0.0–0.1)
Immature Granulocytes: 0 %
Lymphocytes Absolute: 3 10*3/uL (ref 0.7–3.1)
Lymphs: 32 %
MCH: 30.5 pg (ref 26.6–33.0)
MCHC: 34.4 g/dL (ref 31.5–35.7)
MCV: 89 fL (ref 79–97)
Monocytes Absolute: 0.8 10*3/uL (ref 0.1–0.9)
Monocytes: 8 %
Neutrophils Absolute: 5.4 10*3/uL (ref 1.4–7.0)
Neutrophils Relative %: 57 %
Platelets: 389 10*3/uL — ABNORMAL HIGH (ref 150–379)
RBC: 4.91 x10E6/uL (ref 3.77–5.28)
RDW: 12.5 % (ref 12.3–15.4)
WBC: 9.3 10*3/uL (ref 3.4–10.8)

## 2014-02-22 LAB — LAMOTRIGINE LEVEL: Lamotrigine Lvl: 6.1 ug/mL (ref 2.0–20.0)

## 2014-02-23 ENCOUNTER — Telehealth: Payer: Self-pay

## 2014-02-23 NOTE — Telephone Encounter (Signed)
-----   Message from Dennie Bible, NP sent at 02/23/2014 10:32 AM EST ----- Labs look good except liver function elevated. Please send copy to PCP. Good level of Lamictal. Continue same dose.

## 2014-02-23 NOTE — Telephone Encounter (Signed)
Spoke to patient. Gave lab results. Patient verbalized understanding. Routed lab results to PCP.

## 2014-04-02 ENCOUNTER — Other Ambulatory Visit: Payer: Self-pay | Admitting: Family Medicine

## 2014-04-05 ENCOUNTER — Encounter: Payer: Self-pay | Admitting: Nurse Practitioner

## 2014-04-05 ENCOUNTER — Ambulatory Visit (INDEPENDENT_AMBULATORY_CARE_PROVIDER_SITE_OTHER): Payer: Medicare Other | Admitting: Nurse Practitioner

## 2014-04-05 VITALS — BP 104/74 | HR 112 | Temp 98.2°F | Ht 60.0 in | Wt 168.0 lb

## 2014-04-05 DIAGNOSIS — Z01419 Encounter for gynecological examination (general) (routine) without abnormal findings: Secondary | ICD-10-CM

## 2014-04-05 DIAGNOSIS — Z0000 Encounter for general adult medical examination without abnormal findings: Secondary | ICD-10-CM

## 2014-04-05 DIAGNOSIS — L03032 Cellulitis of left toe: Secondary | ICD-10-CM

## 2014-04-05 MED ORDER — CIPROFLOXACIN HCL 500 MG PO TABS
500.0000 mg | ORAL_TABLET | Freq: Two times a day (BID) | ORAL | Status: DC
Start: 2014-04-05 — End: 2014-04-19

## 2014-04-05 NOTE — Progress Notes (Signed)
   Subjective:    Patient ID: [name], female    DOB: 1965-12-10, 49 y.o.   MRN: [id number]  HPI Patient in today for CPE and PAP- SHe was seen for routine follow up in February. She is doing well today. Has n ot had a seizure in over 2 weeks. She sees neurologist on a regular basis   Review of Systems  Constitutional: Negative.   HENT: Negative.   Respiratory: Negative.   Cardiovascular: Negative.   Genitourinary: Negative.   Neurological: Negative.   Psychiatric/Behavioral: Negative.   All other systems reviewed and are negative.      Objective:   Physical Exam  Constitutional: She is oriented to person, place, and time. She appears well-developed and well-nourished.  HENT:  Head: Normocephalic.  Right Ear: Hearing, tympanic membrane, external ear and ear canal normal.  Left Ear: Hearing, tympanic membrane, external ear and ear canal normal.  Nose: Nose normal.  Mouth/Throat: Uvula is midline and oropharynx is clear and moist.  Eyes: Conjunctivae and EOM are normal. Pupils are equal, round, and reactive to light.  Neck: Normal range of motion and full passive range of motion without pain. Neck supple. No JVD present. Carotid bruit is not present. No thyroid mass and no thyromegaly present.  Cardiovascular: Normal rate, regular rhythm, normal heart sounds and intact distal pulses.   No murmur heard. Pulmonary/Chest: Effort normal and breath sounds normal. Right breast exhibits no inverted nipple, no mass, no nipple discharge, no skin change and no tenderness. Left breast exhibits no inverted nipple, no mass, no nipple discharge, no skin change and no tenderness.  Abdominal: Soft. Bowel sounds are normal. She exhibits no mass. There is no tenderness.  Genitourinary: Vagina normal and uterus normal. No breast swelling, tenderness, discharge or bleeding.  bimanual exam-No adnexal masses or tenderness. Vaginal cuff intact   Musculoskeletal: Normal range of motion.    Lymphadenopathy:    She has no cervical adenopathy.  Neurological: She is alert and oriented to person, place, and time.  Skin: Skin is warm and dry.  Left third toe erythematous border- tender to touch with yellow excudate  Psychiatric: She has a normal mood and affect. Her behavior is normal. Judgment and thought content normal.   BP 104/74 mmHg  Pulse 112  Temp(Src) 98.2 F (36.8 C) (Oral)  Ht 5' (1.524 m)  Wt 168 lb (76.204 kg)  BMI 32.81 kg/m2        Assessment & Plan:  1. Annual physical exam  2. Encounter for routine gynecological examination - Pap IG w/ reflex to HPV when ASC-U  3. Cellulitis of middle toe, left Soak in epsom salt BID Do not pick at area RTO if not improving - ciprofloxacin (CIPRO) 500 MG tablet; Take 1 tablet (500 mg total) by mouth 2 (two) times daily.  Dispense: 20 tablet; Refill: 0  [name], FNP

## 2014-04-05 NOTE — Patient Instructions (Signed)

## 2014-04-08 LAB — PAP IG W/ RFLX HPV ASCU: PAP Smear Comment: 0

## 2014-04-09 ENCOUNTER — Other Ambulatory Visit: Payer: Self-pay | Admitting: Nurse Practitioner

## 2014-04-10 ENCOUNTER — Telehealth: Payer: Self-pay | Admitting: Nurse Practitioner

## 2014-04-10 ENCOUNTER — Other Ambulatory Visit: Payer: Self-pay

## 2014-04-10 ENCOUNTER — Ambulatory Visit (INDEPENDENT_AMBULATORY_CARE_PROVIDER_SITE_OTHER): Payer: Medicare Other | Admitting: Family Medicine

## 2014-04-10 ENCOUNTER — Ambulatory Visit (INDEPENDENT_AMBULATORY_CARE_PROVIDER_SITE_OTHER): Payer: Medicare Other

## 2014-04-10 ENCOUNTER — Encounter: Payer: Self-pay | Admitting: Family Medicine

## 2014-04-10 ENCOUNTER — Telehealth: Payer: Self-pay | Admitting: Neurology

## 2014-04-10 VITALS — BP 113/83 | HR 89 | Temp 98.2°F | Ht 60.0 in | Wt 170.8 lb

## 2014-04-10 DIAGNOSIS — M25572 Pain in left ankle and joints of left foot: Secondary | ICD-10-CM

## 2014-04-10 MED ORDER — CLONAZEPAM 0.5 MG PO TABS: 0.5000 mg | ORAL_TABLET | Freq: Three times a day (TID) | ORAL | Status: DC | PRN

## 2014-04-10 NOTE — Telephone Encounter (Signed)
According to my last note I have asked to go to mental health in [place] for treatment of her depression. Her PCP just renewed Prozac and [place] FNP. She would need to obtain these from them.

## 2014-04-10 NOTE — Telephone Encounter (Signed)
Last seen 04/05/14 MMM  If approved route to nurse to call into [facility]

## 2014-04-10 NOTE — Telephone Encounter (Signed)
Ativan is not on med list.  I called the patient back to clarify.  Said she would like Ativan prescribed to help with seizures and sleeping.  Says she is not taking Clonazepam from psych.  I called the pharmacy.  Spoke with [employer].  She verified he patient has not been getting Clonazepam, and does not have Ativan on her file.  Would you like to prescribe?  Please advise.  Thank you.

## 2014-04-10 NOTE — Telephone Encounter (Signed)
I called the patient back.  Got no answer.  Left message. 

## 2014-04-10 NOTE — Telephone Encounter (Signed)
Patient requesting Rx for ATIVAN 0.5mg  called into [place], [place], [place].  Please call and advise.

## 2014-04-10 NOTE — Progress Notes (Signed)
Subjective:  Patient ID: [name], female    DOB: 1965/07/30  Age: 49 y.o. MRN: [id number]  CC: Ankle Pain   HPI [name] presents for Onset 4 days ago. Patient has fairly frequent seizures. When this happened she had a seizure and her husband says she was having her legs flop around at one point she her leg was under the kitchen table and she may have hit the table leg with her foot. Otherwise there is no known injury. Patient has no recollection of the event. She just remembers that her leg was doing fine and then she had seizure and the ankle was hurting afterwards. History Charmine has a past medical history of Vertigo; Constipation; Restless leg; High cholesterol; Chronic bronchitis; Headache(784.0); Depression; Epilepsy; Seizures; Arthritis; Osteoarthritis; Colon polyps; Rectal bleed; and Other convulsions (05/21/12).   She has past surgical history that includes Total hip arthroplasty (Left, 1993; 1995; 2000); Abdominal hysterectomy (2001); Colonoscopy; Joint replacement; Mass excision (10/22/2011); Bladder suspension; Cesarean section (1987; 1988); and Bunionectomy (Left, 2000).   Her family history includes Cancer in her brother, maternal aunt, and mother; Diabetes in her brother and maternal aunt.She reports that she has never smoked. She has never used smokeless tobacco. She reports that she does not drink alcohol or use illicit drugs.  Current Outpatient Prescriptions on File Prior to Visit  Medication Sig Dispense Refill  . Alum & Mag Hydroxide-Simeth (MAGIC MOUTHWASH) SOLN Take 5 mLs by mouth 4 (four) times daily. 150 mL 0  . ciprofloxacin (CIPRO) 500 MG tablet Take 1 tablet (500 mg total) by mouth 2 (two) times daily. 20 tablet 0  . diclofenac sodium (VOLTAREN) 1 % GEL Apply 1 application topically daily as needed (foot pain).    Marland Kitchen FLUoxetine (PROZAC) 20 MG capsule Take 2 capsules (40 mg total) by mouth daily. 60 capsule 3  . gabapentin (NEURONTIN) 600 MG tablet Take  600-1,200 mg by mouth See admin instructions. Take 1 capsule (600 mg) 3 times daily with meals and take 2 capsules (1200 mg) at bedtime total 3000 mg per day.    . Iron-Vitamins (GERITOL PO) Take 1 tablet by mouth daily.    Marland Kitchen lamoTRIgine (LAMICTAL) 100 MG tablet Take 1 tablet (100 mg total) by mouth 2 (two) times daily. 60 tablet 6  . levETIRAcetam (KEPPRA) 500 MG tablet Take 1 tablet (500 mg total) by mouth 2 (two) times daily. 60 tablet 6  . omega-3 acid ethyl esters (LOVAZA) 1 G capsule Take 2 capsules (2 g total) by mouth 2 (two) times daily. 120 capsule 11  . pramipexole (MIRAPEX) 0.125 MG tablet TAKE TWO TABLETS BY MOUTH AT BEDTIME 60 tablet 2   No current facility-administered medications on file prior to visit.    ROS Review of Systems  Constitutional: Negative for fever, chills, diaphoresis, appetite change and fatigue.  HENT: Negative for congestion, ear pain, hearing loss, postnasal drip, rhinorrhea, sore throat and trouble swallowing.   Respiratory: Negative for cough, chest tightness and shortness of breath.   Cardiovascular: Negative for chest pain and palpitations.  Gastrointestinal: Negative for abdominal pain.  Skin: Negative for rash.    Objective:  BP 113/83 mmHg  Pulse 89  Temp(Src) 98.2 F (36.8 C) (Oral)  Ht 5' (1.524 m)  Wt 170 lb 12.8 oz (77.474 kg)  BMI 33.36 kg/m2  BP Readings from Last 3 Encounters:  04/10/14 113/83  04/05/14 104/74  02/20/14 117/90    Wt Readings from Last 3 Encounters:  04/10/14 170  lb 12.8 oz (77.474 kg)  04/05/14 168 lb (76.204 kg)  02/20/14 168 lb 3.2 oz (76.295 kg)     Physical Exam  Constitutional: She is oriented to person, place, and time. She appears well-developed and well-nourished. No distress.  HENT:  Head: Normocephalic and atraumatic.  Right Ear: External ear normal.  Left Ear: External ear normal.  Nose: Nose normal.  Mouth/Throat: Oropharynx is clear and moist.  Eyes: Conjunctivae and EOM are normal.  Pupils are equal, round, and reactive to light.  Neck: Normal range of motion. Neck supple. No thyromegaly present.  Cardiovascular: Normal rate, regular rhythm and normal heart sounds.   No murmur heard. Pulmonary/Chest: Effort normal and breath sounds normal. No respiratory distress. She has no wheezes. She has no rales.  Abdominal: Soft. Bowel sounds are normal. She exhibits no distension. There is no tenderness.  Musculoskeletal: She exhibits edema and tenderness.  The left foot at the distal aspect of the lateral malleolus has 2+ edema with tenderness. There is full range of motion with good stability of the ankle for drawer sign, eversion and inversion. The foot is neurovascularly intact.  Lymphadenopathy:    She has no cervical adenopathy.  Neurological: She is alert and oriented to person, place, and time. She has normal reflexes.  Skin: Skin is warm and dry.  Psychiatric: She has a normal mood and affect. Her behavior is normal. Judgment and thought content normal.    Lab Results  Component Value Date   HGBA1C 5.4 09/08/2013    Lab Results  Component Value Date   WBC 9.3 02/20/2014   HGB 15.0 02/20/2014   HCT 43.6 02/20/2014   PLT 389* 02/20/2014   GLUCOSE 114* 02/20/2014   ALT 142* 02/20/2014   AST 139* 02/20/2014   NA 143 02/20/2014   K 3.5 02/20/2014   CL 98 02/20/2014   CREATININE 0.80 02/20/2014   BUN 15 02/20/2014   CO2 25 02/20/2014   TSH 1.650 05/08/2013   INR 1.09 05/08/2013   HGBA1C 5.4 09/08/2013    Ct Renal Stone Study  01/04/2014   CLINICAL DATA:  Left flank pain.  Micro hematuria.  Nephrolithiasis.  EXAM: CT ABDOMEN AND PELVIS WITHOUT CONTRAST  TECHNIQUE: Multidetector CT imaging of the abdomen and pelvis was performed following the standard protocol without IV contrast.  COMPARISON:  12/04/2013 and 08/19/2007  FINDINGS: Lower chest:  Unremarkable.  Hepatobiliary:  No mass visualized on this non-contrast exam.  Pancreas: No mass or inflammatory process  visualized on this non-contrast exam.  Spleen:  Within normal limits in size.  Adrenal Glands:  No mass identified.  Kidneys/Urinary tract: Small less than 1 cm intrarenal calculi are again seen bilaterally. No evidence hydronephrosis. No evidence of ureteral calculi. Previously seen left hydronephrosis and distal left ureteral calculus are no longer visualized. Streak artifact again noted in pelvis from left hip prosthesis.  Stomach/Bowel/Peritoneum:  Unremarkable.  Vascular/Lymphatic: No pathologically enlarged lymph nodes identified. No other significant abnormality identified.  Reproductive: Prior hysterectomy noted. Artifact and pelvis noted from left hip prosthesis.  Other:  None.  Musculoskeletal:  No suspicious bone lesions identified.  IMPRESSION: Bilateral nonobstructive nephrolithiasis. No evidence of ureteral calculi, hydronephrosis, or other acute findings.   Electronically Signed   By: Earle Gell M.D.   On: 01/04/2014 11:17    Assessment & Plan:   [place] was seen today for ankle pain.  Diagnoses and all orders for this visit:  Left ankle pain Orders: -     DG Ankle  Complete Left -     Post-op shoe  Other orders -     clonazePAM (KLONOPIN) 0.5 MG tablet; Take 1 tablet (0.5 mg total) by mouth 3 (three) times daily as needed.   I have changed Ms. Krog's clonazePAM. I am also having her maintain her omega-3 acid ethyl esters, diclofenac sodium, Iron-Vitamins (GERITOL PO), FLUoxetine, gabapentin, magic mouthwash, lamoTRIgine, levETIRAcetam, pramipexole, and ciprofloxacin.  Meds ordered this encounter  Medications  . DISCONTD: FLUoxetine (PROZAC) 20 MG tablet    Sig:     Refill:  3  . clonazePAM (KLONOPIN) 0.5 MG tablet    Sig: Take 1 tablet (0.5 mg total) by mouth 3 (three) times daily as needed.    Dispense:  90 tablet    Refill:  1     Follow-up: No Follow-up on file.  [name], M.D.

## 2014-04-11 ENCOUNTER — Telehealth: Payer: Self-pay | Admitting: *Deleted

## 2014-04-11 MED ORDER — FLUOXETINE HCL 20 MG PO CAPS: 40.0000 mg | ORAL_CAPSULE | Freq: Every day | ORAL | Status: DC

## 2014-04-11 NOTE — Telephone Encounter (Signed)
Form,[employer] sent to [facility] and Dr Dohmeier 04-11-14.

## 2014-04-12 DIAGNOSIS — Z0289 Encounter for other administrative examinations: Secondary | ICD-10-CM

## 2014-04-17 NOTE — Telephone Encounter (Signed)
Form done, to Dr. Brett Fairy for signature.

## 2014-04-19 ENCOUNTER — Ambulatory Visit (INDEPENDENT_AMBULATORY_CARE_PROVIDER_SITE_OTHER): Payer: Medicare Other | Admitting: Family Medicine

## 2014-04-19 ENCOUNTER — Encounter: Payer: Self-pay | Admitting: Family Medicine

## 2014-04-19 VITALS — BP 112/81 | HR 84 | Temp 97.7°F | Ht 60.0 in | Wt 167.0 lb

## 2014-04-19 DIAGNOSIS — R300 Dysuria: Secondary | ICD-10-CM

## 2014-04-19 LAB — POCT URINALYSIS DIPSTICK
Bilirubin, UA: NEGATIVE
Blood, UA: NEGATIVE
Glucose, UA: NEGATIVE
Ketones, UA: NEGATIVE
Nitrite, UA: NEGATIVE
Protein, UA: NEGATIVE
Spec Grav, UA: 1.02
Urobilinogen, UA: NEGATIVE
pH, UA: 6

## 2014-04-19 LAB — POCT UA - MICROSCOPIC ONLY
Casts, Ur, LPF, POC: NEGATIVE
Crystals, Ur, HPF, POC: NEGATIVE
Mucus, UA: NEGATIVE
Yeast, UA: NEGATIVE

## 2014-04-19 MED ORDER — NITROFURANTOIN MONOHYD MACRO 100 MG PO CAPS
100.0000 mg | ORAL_CAPSULE | Freq: Two times a day (BID) | ORAL | Status: DC
Start: 2014-04-19 — End: 2014-07-12

## 2014-04-19 NOTE — Progress Notes (Signed)
   Subjective:    Patient ID: [name], female    DOB: 1965-12-31, 49 y.o.   MRN: [id number]  HPI 49 year old female with a three-day history of urinary frequency and dysuria. She denies fever chills or back pain. She has had UTIs in the past. Of note, her kidney doctor told her to drink more liquids and that could account for some of her urinary frequency. She also denies any itching or vaginal discharge.  Patient Active Problem List   Diagnosis Date Noted  . Epilepsy without status epilepticus, not intractable 02/20/2014  . Memory loss 02/20/2014  . Other forms of epilepsy and recurrent seizures without mention of intractable epilepsy 08/17/2013  . 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 Otitis media, right 05/09/2013  . Otitis externa 05/08/2013  . Epilepsy, grand mal 04/22/2012    Class: Chronic  . Drug overdose, multiple drugs 07/17/2011  . Seizure disorder 07/17/2011  . Moderate major depression 10/14/2010   Outpatient Encounter Prescriptions as of 04/19/2014  Medication Sig  . clonazePAM (KLONOPIN) 0.5 MG tablet Take 1 tablet (0.5 mg total) by mouth 3 (three) times daily as needed.  Marland Kitchen FLUoxetine (PROZAC) 20 MG capsule Take 2 capsules (40 mg total) by mouth daily.  Marland Kitchen gabapentin (NEURONTIN) 600 MG tablet Take 600-1,200 mg by mouth See admin instructions. Take 1 capsule (600 mg) 3 times daily with meals and take 2 capsules (1200 mg) at bedtime total 3000 mg per day.  . Iron-Vitamins (GERITOL PO) Take 1 tablet by mouth daily.  Marland Kitchen lamoTRIgine (LAMICTAL) 100 MG tablet Take 1 tablet (100 mg total) by mouth 2 (two) times daily.  Marland Kitchen levETIRAcetam (KEPPRA) 500 MG tablet Take 1 tablet (500 mg total) by mouth 2 (two) times daily.  . pramipexole (MIRAPEX) 0.125 MG tablet TAKE TWO TABLETS BY MOUTH AT BEDTIME  . [DISCONTINUED] omega-3 acid ethyl esters (LOVAZA) 1 G capsule Take 2 capsules (2 g total) by mouth 2 (two) times daily.  . Alum & Mag Hydroxide-Simeth (MAGIC MOUTHWASH) SOLN Take 5 mLs by mouth 4  (four) times daily. (Patient not taking: Reported on 04/19/2014)  . [DISCONTINUED] ciprofloxacin (CIPRO) 500 MG tablet Take 1 tablet (500 mg total) by mouth 2 (two) times daily.  . [DISCONTINUED] diclofenac sodium (VOLTAREN) 1 % GEL Apply 1 application topically daily as needed (foot pain).     Review of Systems  Constitutional: Negative.   Genitourinary: Positive for dysuria and frequency.       Objective:   Physical Exam  Constitutional: She appears well-developed and well-nourished.  Cardiovascular: Normal rate.     BP 112/81 mmHg  Pulse 84  Temp(Src) 97.7 F (36.5 C) (Oral)  Ht 5' (1.524 m)  Wt 167 lb (75.751 kg)  BMI 32.62 kg/m2       Assessment & Plan:  1. Dysuria Patient has white blood cells on urinalysis. Has just finished a course of Cipro and has been off that medicine for 3-4 days. Symptoms did not suggest monilia vaginitis. Will try a short course of Macrobid for 5 days. Continue to drink lots of water. Call us if symptoms do not resolve. - POCT UA - Microscopic Only  Wardell Honour MD - POCT urinalysis dipstick

## 2014-04-21 ENCOUNTER — Telehealth: Payer: Self-pay | Admitting: Neurology

## 2014-04-21 NOTE — Telephone Encounter (Signed)
I called and LMVM for pt that it looks like FNP Ronnald Collum prescribed this for her.  Please contact her office. If questions please call us back.

## 2014-04-21 NOTE — Telephone Encounter (Signed)
Pt is calling stating she doesn't feel like the pramipexole (MIRAPEX) 0.125 MG tablet is working like it use to and she wants to know what can be done about this.  Please call and advise.

## 2014-04-24 ENCOUNTER — Telehealth: Payer: Self-pay | Admitting: Nurse Practitioner

## 2014-04-24 MED ORDER — PRAMIPEXOLE DIHYDROCHLORIDE 0.5 MG PO TABS: 0.5000 mg | ORAL_TABLET | Freq: Two times a day (BID) | ORAL | Status: DC

## 2014-04-24 NOTE — Telephone Encounter (Signed)
Please advise 

## 2014-04-24 NOTE — Telephone Encounter (Signed)
Patient notified that rx sent to pharmacy 

## 2014-04-24 NOTE — Telephone Encounter (Signed)
Increased dose of mirapex and sent to pharmacy

## 2014-06-05 ENCOUNTER — Other Ambulatory Visit: Payer: Self-pay | Admitting: Family Medicine

## 2014-06-06 NOTE — Telephone Encounter (Signed)
Last seen 04/19/14 Dr Sabra Heck  If approved route to nurse to call into [facility]

## 2014-06-06 NOTE — Telephone Encounter (Signed)
rx called into pharmacy

## 2014-06-21 ENCOUNTER — Ambulatory Visit (INDEPENDENT_AMBULATORY_CARE_PROVIDER_SITE_OTHER): Payer: Medicare Other | Admitting: Family

## 2014-06-21 ENCOUNTER — Encounter: Payer: Self-pay | Admitting: Family

## 2014-06-21 VITALS — BP 111/78 | HR 84 | Temp 97.5°F | Ht 60.0 in | Wt 172.2 lb

## 2014-06-21 DIAGNOSIS — J069 Acute upper respiratory infection, unspecified: Secondary | ICD-10-CM | POA: Diagnosis not present

## 2014-06-21 MED ORDER — BENZONATATE 200 MG PO CAPS: 200.0000 mg | ORAL_CAPSULE | Freq: Three times a day (TID) | ORAL | Status: DC | PRN

## 2014-06-21 MED ORDER — AZITHROMYCIN 250 MG PO TABS: ORAL_TABLET | ORAL | Status: DC

## 2014-06-21 NOTE — Progress Notes (Signed)
   Subjective:    Patient ID: [name], female    DOB: 1965/04/08, 49 y.o.   MRN: [id number]  Cough This is a new problem. The current episode started in the past 7 days. The problem has been unchanged. The problem occurs constantly. The cough is non-productive. Associated symptoms include chills, headaches, myalgias, nasal congestion, postnasal drip, a sore throat and shortness of breath. Pertinent negatives include no ear pain, fever, rhinorrhea or wheezing. The symptoms are aggravated by lying down. She has tried nothing for the symptoms. The treatment provided no relief. There is no history of asthma or COPD.      Review of Systems  Constitutional: Positive for chills. Negative for fever.  HENT: Positive for postnasal drip and sore throat. Negative for ear pain and rhinorrhea.   Eyes: Negative.   Respiratory: Positive for cough and shortness of breath. Negative for wheezing.   Cardiovascular: Negative.  Negative for palpitations.  Gastrointestinal: Negative.   Endocrine: Negative.   Genitourinary: Negative.   Musculoskeletal: Positive for myalgias.  Neurological: Positive for headaches.  Hematological: Negative.   Psychiatric/Behavioral: Negative.   All other systems reviewed and are negative.      Objective:   Physical Exam  Constitutional: She is oriented to person, place, and time. She appears well-developed and well-nourished. No distress.  HENT:  Head: Normocephalic and atraumatic.  Right Ear: External ear normal.  Left Ear: External ear normal.  Nasal passage with mild swelling Oropharynx erythema     Eyes: Pupils are equal, round, and reactive to light.  Neck: Normal range of motion. Neck supple. No thyromegaly present.  Cardiovascular: Normal rate, regular rhythm, normal heart sounds and intact distal pulses.   No murmur heard. Pulmonary/Chest: Effort normal and breath sounds normal. No respiratory distress. She has no wheezes.  Abdominal: Soft. Bowel  sounds are normal. She exhibits no distension. There is no tenderness.  Musculoskeletal: Normal range of motion. She exhibits no edema or tenderness.  Neurological: She is alert and oriented to person, place, and time. She has normal reflexes. No cranial nerve deficit.  Skin: Skin is warm and dry.  Psychiatric: She has a normal mood and affect. Her behavior is normal. Judgment and thought content normal.  Vitals reviewed.     BP 111/78 mmHg  Pulse 84  Temp(Src) 97.5 F (36.4 C) (Oral)  Ht 5' (1.524 m)  Wt 172 lb 3.2 oz (78.109 kg)  BMI 33.63 kg/m2     Assessment & Plan:  1. Acute upper respiratory infection -- Take meds as prescribed - Use a cool mist humidifier  -Use saline nose sprays frequently -Saline irrigations of the nose can be very helpful if done frequently.  * 4X daily for 1 week*  * Use of a nettie pot can be helpful with this. Follow directions with this* -Force fluids -For any cough or congestion  Use plain Mucinex- regular strength or max strength is fine   * Children- consult with Pharmacist for dosing -For fever or aces or pains- take tylenol or ibuprofen appropriate for age and weight.  * for fevers greater than 101 orally you may alternate ibuprofen and tylenol every  3 hours. -Throat lozenges if helps - azithromycin (ZITHROMAX) 250 MG tablet; Take 500 mg once, then 250 mg for four days  Dispense: 6 tablet; Refill: 0  Evelina Dun, FNP

## 2014-06-21 NOTE — Patient Instructions (Signed)
Upper Respiratory Infection, Adult An upper respiratory infection (URI) is also sometimes known as the common cold. The upper respiratory tract includes the nose, sinuses, throat, trachea, and bronchi. Bronchi are the airways leading to the lungs. Most people improve within 1 week, but symptoms can last up to 2 weeks. A residual cough may last even longer.  CAUSES Many different viruses can infect the tissues lining the upper respiratory tract. The tissues become irritated and inflamed and often become very moist. Mucus production is also common. A cold is contagious. You can easily spread the virus to others by oral contact. This includes kissing, sharing a glass, coughing, or sneezing. Touching your mouth or nose and then touching a surface, which is then touched by another person, can also spread the virus. SYMPTOMS  Symptoms typically develop 1 to 3 days after you come in contact with a cold virus. Symptoms vary from person to person. They may include:  Runny nose.  Sneezing.  Nasal congestion.  Sinus irritation.  Sore throat.  Loss of voice (laryngitis).  Cough.  Fatigue.  Muscle aches.  Loss of appetite.  Headache.  Low-grade fever. DIAGNOSIS  You might diagnose your own cold based on familiar symptoms, since most people get a cold 2 to 3 times a year. Your caregiver can confirm this based on your exam. Most importantly, your caregiver can check that your symptoms are not due to another disease such as strep throat, sinusitis, pneumonia, asthma, or epiglottitis. Blood tests, throat tests, and X-rays are not necessary to diagnose a common cold, but they may sometimes be helpful in excluding other more serious diseases. Your caregiver will decide if any further tests are required. RISKS AND COMPLICATIONS  You may be at risk for a more severe case of the common cold if you smoke cigarettes, have chronic heart disease (such as heart failure) or lung disease (such as asthma), or if  you have a weakened immune system. The very young and very old are also at risk for more serious infections. Bacterial sinusitis, middle ear infections, and bacterial pneumonia can complicate the common cold. The common cold can worsen asthma and chronic obstructive pulmonary disease (COPD). Sometimes, these complications can require emergency medical care and may be life-threatening. PREVENTION  The best way to protect against getting a cold is to practice good hygiene. Avoid oral or hand contact with people with cold symptoms. Wash your hands often if contact occurs. There is no clear evidence that vitamin C, vitamin E, echinacea, or exercise reduces the chance of developing a cold. However, it is always recommended to get plenty of rest and practice good nutrition. TREATMENT  Treatment is directed at relieving symptoms. There is no cure. Antibiotics are not effective, because the infection is caused by a virus, not by bacteria. Treatment may include:  Increased fluid intake. Sports drinks offer valuable electrolytes, sugars, and fluids.  Breathing heated mist or steam (vaporizer or shower).  Eating chicken soup or other clear broths, and maintaining good nutrition.  Getting plenty of rest.  Using gargles or lozenges for comfort.  Controlling fevers with ibuprofen or acetaminophen as directed by your caregiver.  Increasing usage of your inhaler if you have asthma. Zinc gel and zinc lozenges, taken in the first 24 hours of the common cold, can shorten the duration and lessen the severity of symptoms. Pain medicines may help with fever, muscle aches, and throat pain. A variety of non-prescription medicines are available to treat congestion and runny nose. Your caregiver   can make recommendations and may suggest nasal or lung inhalers for other symptoms.  HOME CARE INSTRUCTIONS   Only take over-the-counter or prescription medicines for pain, discomfort, or fever as directed by your  caregiver.  Use a warm mist humidifier or inhale steam from a shower to increase air moisture. This may keep secretions moist and make it easier to breathe.  Drink enough water and fluids to keep your urine clear or pale yellow.  Rest as needed.  Return to work when your temperature has returned to normal or as your caregiver advises. You may need to stay home longer to avoid infecting others. You can also use a face mask and careful hand washing to prevent spread of the virus. SEEK MEDICAL CARE IF:   After the first few days, you feel you are getting worse rather than better.  You need your caregiver's advice about medicines to control symptoms.  You develop chills, worsening shortness of breath, or brown or red sputum. These may be signs of pneumonia.  You develop yellow or brown nasal discharge or pain in the face, especially when you bend forward. These may be signs of sinusitis.  You develop a fever, swollen neck glands, pain with swallowing, or white areas in the back of your throat. These may be signs of strep throat. SEEK IMMEDIATE MEDICAL CARE IF:   You have a fever.  You develop severe or persistent headache, ear pain, sinus pain, or chest pain.  You develop wheezing, a prolonged cough, cough up blood, or have a change in your usual mucus (if you have chronic lung disease).  You develop sore muscles or a stiff neck. Document Released: 06/25/2000 Document Revised: 03/24/2011 Document Reviewed: 04/06/2013 [employer] Patient Information 2015 [employer]. This information is not intended to replace advice given to you by your health care provider. Make sure you discuss any questions you have with your health care provider.  - Take meds as prescribed - Use a cool mist humidifier  -Use saline nose sprays frequently -Saline irrigations of the nose can be very helpful if done frequently.  * 4X daily for 1 week*  * Use of a nettie pot can be helpful with this. Follow  directions with this* -Force fluids -For any cough or congestion  Use plain Mucinex- regular strength or max strength is fine   * Children- consult with Pharmacist for dosing -For fever or aces or pains- take tylenol or ibuprofen appropriate for age and weight.  * for fevers greater than 101 orally you may alternate ibuprofen and tylenol every  3 hours. -Throat lozenges if help   Lailie Smead, FNP   

## 2014-07-11 ENCOUNTER — Other Ambulatory Visit: Payer: Self-pay | Admitting: Family Medicine

## 2014-07-11 NOTE — Telephone Encounter (Signed)
Last seen 06/21/14 [name]  If approved route to nurse to call into [facility]

## 2014-07-12 ENCOUNTER — Ambulatory Visit (INDEPENDENT_AMBULATORY_CARE_PROVIDER_SITE_OTHER): Payer: Medicare Other | Admitting: Family

## 2014-07-12 ENCOUNTER — Encounter: Payer: Self-pay | Admitting: Family

## 2014-07-12 VITALS — BP 110/81 | HR 87 | Temp 98.3°F | Ht 60.0 in | Wt 169.6 lb

## 2014-07-12 DIAGNOSIS — S93401A Sprain of unspecified ligament of right ankle, initial encounter: Secondary | ICD-10-CM | POA: Diagnosis not present

## 2014-07-12 MED ORDER — KETOROLAC TROMETHAMINE 30 MG/ML IJ SOLN
30.0000 mg | Freq: Once | INTRAMUSCULAR | Status: AC
  Administered 2014-07-12: 30 mg via INTRAMUSCULAR

## 2014-07-12 MED ORDER — METHYLPREDNISOLONE ACETATE 80 MG/ML IJ SUSP
80.0000 mg | Freq: Once | INTRAMUSCULAR | Status: AC
  Administered 2014-07-12: 80 mg via INTRAMUSCULAR

## 2014-07-12 MED ORDER — MELOXICAM 15 MG PO TABS: 15.0000 mg | ORAL_TABLET | Freq: Every day | ORAL | Status: DC

## 2014-07-12 NOTE — Progress Notes (Signed)
   Subjective:    Patient ID: [name], female    DOB: Sep 29, 1965, 49 y.o.   MRN: [id number]  Ankle Pain  The incident occurred more than 1 week ago (Last Friday). The pain is present in the right ankle. The quality of the pain is described as aching. The pain is at a severity of 7/10. The pain is moderate. The pain has been constant since onset. Pertinent negatives include no inability to bear weight, loss of motion, numbness or tingling. She reports no foreign bodies present. The symptoms are aggravated by movement and weight bearing. She has tried nothing for the symptoms. The treatment provided no relief.      Review of Systems  Constitutional: Negative.   HENT: Negative.   Eyes: Negative.   Respiratory: Negative.  Negative for shortness of breath.   Cardiovascular: Negative.  Negative for palpitations.  Gastrointestinal: Negative.   Endocrine: Negative.   Genitourinary: Negative.   Musculoskeletal: Negative.   Neurological: Negative.  Negative for tingling, numbness and headaches.  Hematological: Negative.   Psychiatric/Behavioral: Negative.   All other systems reviewed and are negative.      Objective:   Physical Exam  Constitutional: She is oriented to person, place, and time. She appears well-developed and well-nourished. No distress.  Eyes: Pupils are equal, round, and reactive to light.  Neck: Normal range of motion. Neck supple. No thyromegaly present.  Cardiovascular: Normal rate, regular rhythm, normal heart sounds and intact distal pulses.   No murmur heard. Pulmonary/Chest: Effort normal and breath sounds normal. No respiratory distress. She has no wheezes.  Abdominal: Soft. Bowel sounds are normal. She exhibits no distension. There is no tenderness.  Musculoskeletal: Normal range of motion. She exhibits no edema or tenderness.  Neurological: She is alert and oriented to person, place, and time. She has normal reflexes. No cranial nerve deficit.  Skin: Skin  is warm and dry.  Psychiatric: She has a normal mood and affect. Her behavior is normal. Judgment and thought content normal.  Vitals reviewed.   BP 110/81 mmHg  Pulse 87  Temp(Src) 98.3 F (36.8 C) (Oral)  Ht 5' (1.524 m)  Wt 169 lb 9.6 oz (76.93 kg)  BMI 33.12 kg/m2       Assessment & Plan:  1. Right ankle sprain, initial encounter -Rest -Ice -Compression -Keep elevated -No other NSAID's while taking mobic -RTO prn - methylPREDNISolone acetate (DEPO-MEDROL) injection 80 mg; Inject 1 mL (80 mg total) into the muscle once. - ketorolac (TORADOL) 30 MG/ML injection 30 mg; Inject 1 mL (30 mg total) into the muscle once. - meloxicam (MOBIC) 15 MG tablet; Take 1 tablet (15 mg total) by mouth daily.  Dispense: 30 tablet; Refill: 0  Evelina Dun, FNP

## 2014-07-12 NOTE — Patient Instructions (Signed)

## 2014-07-17 ENCOUNTER — Other Ambulatory Visit: Payer: Self-pay | Admitting: Nurse Practitioner

## 2014-07-25 ENCOUNTER — Encounter: Payer: Self-pay | Admitting: Physician Assistant

## 2014-07-25 ENCOUNTER — Ambulatory Visit (INDEPENDENT_AMBULATORY_CARE_PROVIDER_SITE_OTHER): Payer: Medicare Other | Admitting: Physician Assistant

## 2014-07-25 VITALS — BP 122/81 | HR 87 | Temp 98.1°F | Ht 60.0 in | Wt 169.0 lb

## 2014-07-25 DIAGNOSIS — M79661 Pain in right lower leg: Secondary | ICD-10-CM | POA: Diagnosis not present

## 2014-07-25 MED ORDER — PREDNISONE 10 MG PO TABS: ORAL_TABLET | ORAL | Status: DC

## 2014-07-25 NOTE — Progress Notes (Signed)
   Subjective:    Patient ID: [name], female    DOB: [birth year], 49 y.o.   MRN: [id number]  HPI 49 y/o female presents with right leg pain x 1 month. Pain is  constant. Pain is worse with weight, and dorsiflexion. Pain is better with rest and better with ankle brace but she does not wear it constantly. She has h/o restless leg syndrome.   Pain is aching. Does not radiate. Had a toradol shot at last visit for leg pain and wants one today. Has not tried any other medications for pain relief   Review of Systems  Constitutional: Negative.   HENT: Negative.   Eyes: Negative.   Respiratory: Negative.   Cardiovascular: Negative.   Gastrointestinal: Negative.   Musculoskeletal: Positive for myalgias (right leg ).       Objective:   Physical Exam  Constitutional: She is oriented to person, place, and time. She appears well-developed and well-nourished.  Cardiovascular: Intact distal pulses.   Musculoskeletal: Normal range of motion. She exhibits no edema or tenderness.  Neurological: She is alert and oriented to person, place, and time. She displays normal reflexes. Coordination normal.  Nursing note and vitals reviewed.         Assessment & Plan:  1. Calf pain, right - Follow up with ortho on 08/09/14 - Take Mobic as prescribed earlier - Wear ankle brace while mobile since this provides relief  - predniSONE (DELTASONE) 10 MG tablet; 4 pills day 1,2, 3 pills day 3,4, 2 pills day 5,6 1 pill day 7,8,9  Dispense: 21 tablet; Refill: 0     Denay Pleitez A. Benjamin Stain PA-C

## 2014-08-21 ENCOUNTER — Encounter: Payer: Self-pay | Admitting: Neurology

## 2014-08-21 ENCOUNTER — Other Ambulatory Visit: Payer: Self-pay

## 2014-08-21 ENCOUNTER — Ambulatory Visit (INDEPENDENT_AMBULATORY_CARE_PROVIDER_SITE_OTHER): Payer: Medicare Other | Admitting: Neurology

## 2014-08-21 VITALS — BP 104/80 | HR 78 | Resp 20 | Ht 61.0 in | Wt 173.0 lb

## 2014-08-21 DIAGNOSIS — R569 Unspecified convulsions: Secondary | ICD-10-CM | POA: Diagnosis not present

## 2014-08-21 DIAGNOSIS — F4001 Agoraphobia with panic disorder: Secondary | ICD-10-CM

## 2014-08-21 DIAGNOSIS — M2669 Other specified disorders of temporomandibular joint: Secondary | ICD-10-CM | POA: Diagnosis not present

## 2014-08-21 MED ORDER — CLONAZEPAM 0.5 MG PO TABS: 0.5000 mg | ORAL_TABLET | Freq: Three times a day (TID) | ORAL | Status: DC

## 2014-08-21 MED ORDER — LAMOTRIGINE 100 MG PO TABS: 100.0000 mg | ORAL_TABLET | Freq: Two times a day (BID) | ORAL | Status: DC

## 2014-08-21 NOTE — Patient Instructions (Signed)
Social Anxiety Disorder  Social anxiety disorder, previously called social phobia, is a mental disorder. People with social anxiety disorder frequently feel nervous, afraid, or embarrassed when around other people in social situations. They constantly worry that other people are judging or criticizing them for how they look, what they say, or how they act. They may worry that other people might reject them because of their appearance or behavior.  Social anxiety disorder is more than just occasional shyness or self-consciousness. It can cause severe emotional distress. It can interfere with daily life activities. Social anxiety disorder also may lead to excessive alcohol or drug use and even suicide.   Social anxiety disorder is actually one of the most common mental disorders. It can develop at any time but usually starts in the teenage years. Women are more commonly affected than men. Social anxiety disorder is also more common in people who have family members with anxiety disorders. It also is more common in people who have physical deformities or conditions with characteristics that are obvious to others, such as stuttered speech or movement abnormalities (Parkinson disease).   SYMPTOMS   In addition to feeling anxious or fearful in social situations, people with social anxiety disorder frequently have physical symptoms. Examples include:  · Red face (blushing).  · Racing heart.  · Sweating.  · Shaky hands or voice.  · Confusion.  · Light-headedness.  · Upset stomach and diarrhea.  DIAGNOSIS   Social anxiety disorder is diagnosed through an assessment by your health care provider. Your health care provider will ask you questions about your mood, thoughts, and reactions in social situations. Your health care provider may ask you about your medical history and use of alcohol or drugs, including prescription medicines. Certain medical conditions and the use of certain substances, including caffeine, can cause  symptoms similar to social anxiety disorder. Your health care provider may refer you to a mental health specialist for further evaluation or treatment.  The criteria for diagnosis of social anxiety disorder are:  · Marked fear or anxiety in one or more social situations in which you may be closely watched or studied by others. Examples of such situations include:    Interacting socially (having a conversation with others, going to a party, or meeting strangers).    Being observed (eating or drinking in public or being called on in class).    Performing in front of others (giving a speech).  · The social situations of concern almost always cause fear or anxiety, not just occasionally.  · People with social anxiety disorder fear that they will be viewed negatively in a way that will be embarrassing, will lead to rejection, or will offend others. This fear is out of proportion to the actual threat posed by the social situation.  · Often the triggering social situations are avoided, or they are endured with intense fear or anxiety. The fear, anxiety, or avoidance is persistent and lasts for 6 months or longer.  · The anxiety causes difficulty functioning in at least some parts of your daily life.  TREATMENT   Several types of treatment are available for social anxiety disorder. These treatments are often used in combination and include:   · Talk therapy. Group talk therapy allows you to see that you are not alone with these problems. Individual talk therapy helps you address your specific anxiety issues with a caring professional. The most effective forms of talk therapy for social anxiety disorder are cognitive-behavioral therapy and exposure therapy.   that you fear most.  Relaxation and coping techniques. These include deep  breathing, self-talk, meditation, visual imagery, and yoga. Relaxation techniques help to keep you calm in social situations.  Social [employer] secretary.Social skills can be learned on your own or with the help of a talk therapist. They can help you feel more confident and comfortable in social situations.  Medicine. For anxiety limited to performance situations (performance anxiety), medicine called beta blockers can help by reducing or preventing the physical symptoms of social anxiety disorder. For more persistent and generalized social anxiety, antidepressant medicine may be prescribed to help control symptoms. In severe cases of social anxiety disorder, strong antianxiety medicine, called benzodiazepines, may be prescribed on a limited basis and for a short time. Document Released: 11/28/2004 Document Revised: 05/16/2013 Document Reviewed: 03/30/2012 [facility] Patient Information 2015 [place], [place]. This information is not intended to replace advice given to you by your health care provider. Make sure you discuss any questions you have with your health care provider.

## 2014-08-21 NOTE — Addendum Note (Signed)
Addended by: Margorie John on: 08/21/2014 12:52 PM   Modules accepted: Orders

## 2014-08-21 NOTE — Progress Notes (Addendum)
[facility]  PATIENT: [name] DOB: December 25, 1965   REASON FOR VISIT: follow up for seizure disorder HISTORY FROM:Patient, friend    HISTORY OF PRESENT ILLNESS: [name] is a 49 y.o. female here as a referral from Dr. Laurance Flatten for spells. She was last seen by Dr. Erling Cruz on 01/12/2012.  The patient's chief complaint : she has a history of new onset generalized tonic-clonic seizures beginning 10-12-2000, accompanied by an abnormal EEG showing generalized sharp wave discharges at 3 Hz. This is consistent with a primary generalized seizure disorder. She was initially treated with Dilantin and then changed to Depakote ER. She was admitted to [facility] November 2002 for suspected pseudoseizures. She would have as many as 7 seizures or spells with generalized jerking , unassociated with tongue bite or incontinence. Because of side effects , a change topiramate was initiated and tolerated. She was referred to Dr. Elisabeth Cara at [facility] documented nonepileptic seizures.  A 24 hour prolonged EEG monitoring and 2004 showed that the patient had both, pseudoseizures and seizures with generalized epilepsy.  In August 2012 she had a repeated seizure activity during her dentist office visit. Blood studies, MRI and CT were normal EEG shorted and spike and wave complexes she was discharged on November Dr. Erling Cruz has recorded that she has to spells per month on average she is followed by Dr. Marliss [facility]. And takes Prozac. Her liver function tests were often elevated 11/22/2011 her AST was 65 and ALT 85 her last ambulatory EEG September 2013 showed abundant generalized 3-5 Hz spikes and polyspike waves. Records reviewed from [facility], last seen by Dr. Inocente Salles in June 2014, where Dr. Inocente Salles stated that she will only be seen prn there,  UPDATE 02/20/14. [name], 49 year old female returns for followup. She was last seen 08/17/13. She has a history of  generalized  tonic-clonic seizures beginning 10-12-2000, accompanied by an abnormal EEG showing generalized sharp wave discharges at 3 Hz. This is consistent with a primary generalized seizure disorder. She was initially treated with Dilantin and then changed to Depakote ER. She was admitted to [facility] November 2002 for suspected pseudoseizures. She would have as many as 7 seizures or spells with generalized jerking , unassociated with tongue bite or incontinence. Because of side effects , a change topiramate was initiated and tolerated. She was referred to Dr. Elisabeth Cara at [facility] documented nonepileptic seizures.  A 24 hour prolonged EEG monitoring and 2004 showed that the patient had both, pseudoseizures and seizures with generalized epilepsy. She is currently on Lamictal and Keppra with last seizure about a month ago. Her seizures disorder is complicated by sub optimal treatment for depression. She complains of memory loss. She is no longer seeing mental health in [facility]. She denies having forgotten any medication doses. She needs medication refills and labs to monitor adverse effects of seizure medication.   Her husband reports that she has an average of 1-4 seizures per month. She reports that she did the best in the past on Keppra and Lamictal, but Keppra was too expensive. She has not been on generic Keppra to her knowledge. She reports last seeing psychiatrist on December 12. She asks to have her Klonopin refilled. Records reviewed from [facility], last seen by Dr. Inocente Salles in June 2014, where Dr. Inocente Salles stated that she will only be seen prn there, patient chooses to be followed by [place] for seizures.   Update from 08-21-14, CD  [name] is here today for follow-up on her subjective memory complaints. A [place] cognitive assessment test was performed today in which she failed on the Trail Making Test and was unable to copy a cube. The contour of her clock was also father droopy but she placed  the numbers and hands correctly. She was able to name objects she did very well in all parts of attention testing including serial sevens and repeated the complicated sentences her word fluency however was limited she only generated 8 words she was good as obstruction and fully oriented to place time and date her delayed recall testing showed 4 out of 5 delayed words to be correctly remembered this is a very good result she scored 26 out of 30 points. Patient is  high school educated and this would be an normal score at 26-30.  We discussed anecdotal evidence for her spells- her husband reported that a couple of times they have been to the grocery store and she needed to sit down for moment she seems to be mostly claustrophobic when surrounded by a lot of people. This may be more of an anxiety attack. Reports some insomnia, mostly related to being worried about her meanwhile 81 year old son. When she hasn't had a good night sleep she is more likely to be hypersensitive to claustrophobia or to other spells of anxiety or panic. No other "seizures " on the current medication.     REVIEW OF SYSTEMS: Full 14 system review of systems performed and notable only for those listed, all others are neg:  Constitutional: fatigue, sleepiness.  Ear/Nose/Throat: neg , dysphonia is chronic, mild dysarthria.  Respiratory: cough Gastroitestinal: neg  Neurological: memory loss, seizure, restless Legs better controlled on medication. She reports pain in an L4 -5 distribution on the right leg.  Psychiatric: depression, confusion, worried about her son being constantly in trouble.  She reports anxiety attacks, panic, and related insomnia.  Sleep : frequent awakening, no night terrors, no sleep walking, no nightmares.    ALLERGIES: Allergies  Allergen Reactions  . Vimpat [Lacosamide] Other (See Comments)    Severe dizziness  . Benadryl [Diphenhydramine Hcl] Other (See Comments)    seizures  . Dilantin [Phenytoin Sodium  Extended] Other (See Comments)    Elevated LFT's  . Melatonin Other (See Comments)    Unknown  . Ultram [Tramadol Hcl] Other (See Comments)    Seizures  . Vicodin [Hydrocodone-Acetaminophen] Itching  . Betadine [Povidone Iodine] Rash  . Codeine Itching and Rash    seizures  . Latex Rash  . Penicillins Itching and Rash  . Tape Rash    Paper tape please    HOME MEDICATIONS: Outpatient Prescriptions Prior to Visit  Medication Sig Dispense Refill  . Alum & Mag Hydroxide-Simeth (MAGIC MOUTHWASH) SOLN Take 5 mLs by mouth 4 (four) times daily. 150 mL 0  . clonazePAM (KLONOPIN) 0.5 MG tablet TAKE ONE TABLET BY MOUTH THREE TIMES DAILY 90 tablet 1  . FLUoxetine (PROZAC) 20 MG capsule Take 2 capsules (40 mg total) by mouth daily. 60 capsule 2  . gabapentin (NEURONTIN) 600 MG tablet Take 600-1,200 mg by mouth See admin instructions. Take 1 capsule (600 mg) 3 times daily with meals and take 2 capsules (1200 mg) at bedtime total 3000 mg per day.    . Iron-Vitamins (GERITOL PO) Take 1 tablet by mouth daily.    Marland Kitchen lamoTRIgine (LAMICTAL) 100 MG tablet Take 1 tablet (100 mg total) by mouth 2 (two) times daily. 60 tablet 6  .  levETIRAcetam (KEPPRA) 500 MG tablet Take 1 tablet (500 mg total) by mouth 2 (two) times daily. 60 tablet 6  . meloxicam (MOBIC) 15 MG tablet Take 1 tablet (15 mg total) by mouth daily. 30 tablet 0  . pramipexole (MIRAPEX) 0.5 MG tablet TAKE ONE TABLET BY MOUTH TWICE DAILY 60 tablet 2  . predniSONE (DELTASONE) 10 MG tablet 4 pills day 1,2, 3 pills day 3,4, 2 pills day 5,6 1 pill day 7,8,9 21 tablet 0   No facility-administered medications prior to visit.    PAST MEDICAL HISTORY: Past Medical History  Diagnosis Date  . Vertigo   . Constipation   . Restless leg   . High cholesterol   . Chronic bronchitis     "yearly; when the weather changes" (04/22/2012)  . DQQIWLNL(892.1)     "1/wk" (04/22/2012)  . Depression   . Epilepsy     "been having them right often here lately"  (04/22/2012)  . Seizures   . Arthritis     "knees" (04/22/2012)  . Osteoarthritis     [name] 04/22/2012  . Colon polyps     adenomatous and hyperplastic-  . Rectal bleed     in toilet- bright red  . Other convulsions 05/21/12    non-epileptic spells    PAST SURGICAL HISTORY: Past Surgical History  Procedure Laterality Date  . Total hip arthroplasty Left 1993; 1995; 2000  . Abdominal hysterectomy  2001  . Colonoscopy    . Joint replacement    . Mass excision  10/22/2011    Procedure: EXCISION MASS;  Surgeon: [name], MD;  Location: [place];  Service: General;  Laterality: Right;  excision right buttock mass  . Bladder suspension    . Cesarean section  1987; 1988  . Bunionectomy Left 2000    FAMILY HISTORY: Family History  Problem Relation Age of Onset  . Cancer Mother   . Cancer Brother   . Diabetes Brother   . Cancer Maternal Aunt   . Diabetes Maternal Aunt     SOCIAL HISTORY: History   Social History  . Marital Status: Married    Spouse Name: [name]  . Number of Children: 2  . Years of Education: 12 +   Occupational History  . unemployed Other   Social History Main Topics  . Smoking status: Never Smoker   . Smokeless tobacco: Never Used  . Alcohol Use: No  . Drug Use: No  . Sexual Activity: Not Currently    Birth Control/ Protection: Post-menopausal   Other Topics Concern  . Not on file   Social History Narrative   Patient is married [name]) and lives at home with her husband.   Patient has two adult children.   Patient is disabled.   Patient has a high school education and some trade school.   Patient is right-handed.   Patient drinks four glasses of soda and tea daily.     PHYSICAL EXAM  Filed Vitals:   08/21/14 1058  BP: 104/80  Pulse: 78  Resp: 20  Height: 5\' 1"  (1.549 m)  Weight: 173 lb (78.472 kg)   Body mass index is 32.7 kg/(m^2).  Generalized: Well developed, in no acute distress  Head: normocephalic and atraumatic,.  Oropharynx benign  Neck: Supple, no carotid bruits  Cardiac: Regular rate rhythm, no murmur  Musculoskeletal: No deformity   Neurological examination   Mentation: Alert oriented to time, place, history taking. Attention span and concentration appropriate. Recent and remote memory intact.  MMSE 30/30. MOCA today 26-30 , discussed details of results above in visit note. AFT 18. Follows all commands speech and language fluent.   Cranial nerve; No loss of smell or taste . No change in taste or smell. Fundoscopic exam without palor- she is unable to trace a moving object and sustain a position of either eye at the temporal 30 degree- eyes drift back- "lazy eyes"  .Pupils were equal round reactive to light / visual field were full on confrontational test.  Facial sensation normal. No numbness at the jaw line.  Facial  movements are peculiar. She has a dry mouth , always wetting her lips.  With a reported TMJ pain, she has had dislocated  right jaw- was unable to chew. hearing was intact to finger rubbing bilaterally.  Uvula tongue midline. head turning and shoulder shrug were normal and symmetric.Tongue protrusion into cheek strength was normal. Shoulder shrug in tact.  Motor: normal bulk and tone, full strength in the BUE, BLE, fine finger movements normal, no pronator drift. No focal weakness Sensory: normal and symmetric to light touch, pinprick, and  Vibration, proprioception  Coordination: finger-nose-finger, heel-to-shin bilaterally, no dysmetria Reflexes: Brachioradialis 2/2, biceps 2/2, triceps 2/2, patellar 2/2, Achilles 2/2, plantar responses were flexor bilaterally. Gait and Station: Rising up from seated position without assistance, normal stance,  moderate stride, good arm swing, smooth turning, able to perform tiptoe, and heel walking without difficulty. Tandem gait is unsteady. No assistive device  DIAGNOSTIC DATA (LABS, IMAGING, TESTING) - I reviewed patient records, labs, notes, testing  and imaging myself where available.  MOCA performed in office and discussed in detail.   Lab Results  Component Value Date   WBC 9.3 02/20/2014   HGB 15.0 02/20/2014   HCT 43.6 02/20/2014   MCV 89 02/20/2014   PLT 389* 02/20/2014      Component Value Date/Time   NA 143 02/20/2014 1402   NA 141 12/13/2013 1840   K 3.5 02/20/2014 1402   CL 98 02/20/2014 1402   CO2 25 02/20/2014 1402   GLUCOSE 114* 02/20/2014 1402   GLUCOSE 105* 12/13/2013 1840   BUN 15 02/20/2014 1402   BUN 11 12/13/2013 1840   CREATININE 0.80 02/20/2014 1402   CALCIUM 10.0 02/20/2014 1402   PROT 7.2 02/20/2014 1402   PROT 6.2 05/09/2013 0445   ALBUMIN 3.1* 05/09/2013 0445   AST 139* 02/20/2014 1402   ALT 142* 02/20/2014 1402   ALKPHOS 79 02/20/2014 1402   BILITOT 0.5 02/20/2014 1402   BILITOT 0.3 05/09/2013 0445   GFRNONAA 87 02/20/2014 1402   GFRAA 101 02/20/2014 1402     Lab Results  Component Value Date   TSH 1.650 05/08/2013      ASSESSMENT AND PLAN    25 minute RV with Monica Sanguinetti domeier, MD , in office detailed  Memory testing , new symptoms addressed, psychiatric and possible giant cell arteritis.  More than 50% of our face-to-face time in this visit today I dedicated to evaluate the new findings of jaw pain on the right with a shooting or electric sensation to the jaw and her report of more blurring and visual impairment on the same side.   Sleep insomnia, try unisom OTC.    49 y.o. year old female  has a past medical history of Vertigo; Restless legs, Chronic bronchitis; Headache(784.0); Depression; pseudo- Epilepsy; convulsions (05/21/12). And new complaint of memory loss here to follow up.  TMJ pain sounds like claudication of the jaw, no associated  Changes  in visual field, but she reports severely blurred vision.  Order c-reactive protein and sed rate , ckmb. Continue Keppra and  will refill Continue Lamictal and will refill Memory score on Meontreal test 26-/30.  Short term memory is  not deficient, visual spatial problems prevail. Will follow over time.  Obtain labs to monitor adverse effects of seizure medications.  Need to follow up with Mental health for optimal treatment of depression. F/U 6 to 8 months With [name], [facility], [facility], APRN   [name], MD   [facility] [address] [place], [place] [address] [phone]

## 2014-08-22 LAB — CK TOTAL AND CKMB (NOT AT ARMC)
CK-MB Index: 1.2 ng/mL (ref 0.0–5.3)
Total CK: 56 U/L (ref 24–173)

## 2014-08-22 LAB — C-REACTIVE PROTEIN: CRP: 5.2 mg/L — ABNORMAL HIGH (ref 0.0–4.9)

## 2014-08-22 LAB — SEDIMENTATION RATE: Sed Rate: 2 mm/hr (ref 0–32)

## 2014-08-24 ENCOUNTER — Telehealth: Payer: Self-pay

## 2014-08-24 NOTE — Telephone Encounter (Signed)
-----   Message from [name], MD sent at 08/23/2014  7:00 PM EDT ----- No evidence of giant cell arteritis , mildly elevated c reactive protein is not diagnostic, and CK was normal , as was sed rate. Considered normal for age group. Please call patient. CD

## 2014-08-24 NOTE — Telephone Encounter (Signed)
Called to give pt lab results. No answer, left a message asking her to call me back.

## 2014-08-24 NOTE — Telephone Encounter (Signed)
Spoke to pt and informed her of lab results. Explained that there is no evidence of giant cell arteritis, the CRP is only mildly elevated, and everything else was normal for age group. Pt verbalized understanding.

## 2014-08-29 ENCOUNTER — Telehealth: Payer: Self-pay | Admitting: *Deleted

## 2014-08-29 DIAGNOSIS — G40309 Generalized idiopathic epilepsy and epileptic syndromes, not intractable, without status epilepticus: Secondary | ICD-10-CM

## 2014-08-29 DIAGNOSIS — G40409 Other generalized epilepsy and epileptic syndromes, not intractable, without status epilepticus: Secondary | ICD-10-CM

## 2014-08-29 NOTE — Telephone Encounter (Signed)
Referral made 

## 2014-08-29 NOTE — Telephone Encounter (Signed)
Who is she currently seeing so I can send to someone else

## 2014-08-29 NOTE — Telephone Encounter (Signed)
NP that did a in home evaluation of the pt called stating pt is on medication for depression as well as her anxiety is up even on her medications, the NP is suggesting she be referred [facility]. Dr.Shin (epileptologist) since her current neurologist has her on multiple medications that aren't helping with the seizures. Would you like to do a different referral for the pt. Please advise.

## 2014-08-29 NOTE — Telephone Encounter (Signed)
They would like her to see Dr. Vernard Gambles epileptologist

## 2014-09-10 ENCOUNTER — Other Ambulatory Visit: Payer: Self-pay | Admitting: Family

## 2014-09-16 ENCOUNTER — Other Ambulatory Visit: Payer: Self-pay | Admitting: Nurse Practitioner

## 2014-09-19 ENCOUNTER — Telehealth: Payer: Self-pay | Admitting: Nurse Practitioner

## 2014-09-19 ENCOUNTER — Ambulatory Visit (INDEPENDENT_AMBULATORY_CARE_PROVIDER_SITE_OTHER): Payer: Medicare Other | Admitting: Family Medicine

## 2014-09-19 ENCOUNTER — Encounter: Payer: Self-pay | Admitting: Family Medicine

## 2014-09-19 VITALS — BP 113/80 | HR 86 | Temp 98.7°F | Ht 60.0 in | Wt 174.0 lb

## 2014-09-19 DIAGNOSIS — Z79899 Other long term (current) drug therapy: Secondary | ICD-10-CM

## 2014-09-19 DIAGNOSIS — R5383 Other fatigue: Secondary | ICD-10-CM | POA: Diagnosis not present

## 2014-09-19 DIAGNOSIS — F4001 Agoraphobia with panic disorder: Secondary | ICD-10-CM | POA: Diagnosis not present

## 2014-09-19 DIAGNOSIS — R569 Unspecified convulsions: Secondary | ICD-10-CM | POA: Diagnosis not present

## 2014-09-19 MED ORDER — CLONAZEPAM 0.5 MG PO TABS: 0.5000 mg | ORAL_TABLET | Freq: Three times a day (TID) | ORAL | Status: DC

## 2014-09-19 MED ORDER — CITALOPRAM HYDROBROMIDE 20 MG PO TABS: 20.0000 mg | ORAL_TABLET | Freq: Every day | ORAL | Status: DC

## 2014-09-19 NOTE — Patient Instructions (Signed)
You may receive a survey either by mail or email. Please take time to complete this as it helps [name] to serve you better.   Seizure, Adult A seizure means there is unusual activity in the brain. A seizure can cause changes in attention or behavior. Seizures often cause shaking (convulsions). Seizures often last from 30 seconds to 2 minutes. HOME CARE   If you are given medicines, take them exactly as told by your doctor.  Keep all doctor visits as told.  Do not swim or drive until your doctor says it is okay.  Teach others what to do if you have a seizure. They should:  Lay you on the ground.  Put a cushion under your head.  Loosen any tight clothing around your neck.  Turn you on your side.  Stay with you until you get better. GET HELP RIGHT AWAY IF:   The seizure lasts longer than 2 to 5 minutes.  The seizure is very bad.  The person does not wake up after the seizure.  The person's attention or behavior changes. Drive the person to the emergency room or call your local emergency services (911 in [place] MAKE SURE YOU:   Understand these instructions.  Will watch your condition.  Will get help right away if you are not doing well or get worse. Document Released: 06/18/2007 Document Revised: 03/24/2011 Document Reviewed: 12/18/2010 [facility] Patient Information 2015 [place], [place]. This information is not intended to replace advice given to you by your health care provider. Make sure you discuss any questions you have with your health care provider.

## 2014-09-19 NOTE — Progress Notes (Signed)
 Subjective:    Patient ID: [name], female    DOB: 04/09/1965, 49 y.o.   MRN: [id number]  HPI 9-year-old female with known seizure disorder who was recently admitted to the hospital after having had seizures. Apparently blood levels of her anticonvulsives, Lamictal and Keppra were both in the therapeutic range and no real changes were made. Apparently, his seizures were related to stress for which she takes Klonopin 0.5 mg 3 times a day. She is also on Prozac which may not helping the stress.  She does complain of some tiredness malaise and fatigue. When questioned she wakes up in the morning feeling that way rather than developing the symptoms as the day progresses.  Patient Active Problem List   Diagnosis Date Noted  . Epilepsy without status epilepticus, not intractable 02/20/2014  . Memory loss 02/20/2014  . Other forms of epilepsy and recurrent seizures without mention of intractable epilepsy 08/17/2013  . 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 Otitis media, right 05/09/2013  . Otitis externa 05/08/2013  . Epilepsy, grand mal 04/22/2012    Class: Chronic  . Drug overdose, multiple drugs 07/17/2011  . Seizure disorder 07/17/2011  . Moderate major depression 10/14/2010   Outpatient Encounter Prescriptions as of 09/19/2014  Medication Sig  . Alum & Mag Hydroxide-Simeth (MAGIC MOUTHWASH) SOLN Take 5 mLs by mouth 4 (four) times daily.  . clonazePAM (KLONOPIN) 0.5 MG tablet Take 1 tablet (0.5 mg total) by mouth 3 (three) times daily.  . FLUoxetine (PROZAC) 20 MG capsule Take 2 capsules (40 mg total) by mouth daily.  . gabapentin (NEURONTIN) 600 MG tablet Take 600-1,200 mg by mouth See admin instructions. Take 1 capsule (600 mg) 3 times daily with meals and take 2 capsules (1200 mg) at bedtime total 3000 mg per day.  . Iron-Vitamins (GERITOL PO) Take 1 tablet by mouth daily.  . lamoTRIgine (LAMICTAL) 100 MG tablet Take 1 tablet (100 mg total) by mouth 2 (two) times daily. Keep 12 hours apart.    . levETIRAcetam (KEPPRA) 500 MG tablet Take 1 tablet (500 mg total) by mouth 2 (two) times daily.  . meloxicam (MOBIC) 15 MG tablet Take 1 tablet (15 mg total) by mouth daily.  . pramipexole (MIRAPEX) 0.5 MG tablet TAKE ONE TABLET BY MOUTH TWICE DAILY   No facility-administered encounter medications on file as of 09/19/2014.      Review of Systems  Constitutional: Positive for fatigue.  Respiratory: Negative.   Cardiovascular: Negative.   Neurological: Positive for seizures.  Psychiatric/Behavioral: The patient is nervous/anxious.        Objective:   Physical Exam  Constitutional: She is oriented to person, place, and time. She appears well-developed and well-nourished.  Cardiovascular: Normal rate and regular rhythm.   Pulmonary/Chest: Effort normal and breath sounds normal.  Neurological: She is alert and oriented to person, place, and time. She has normal reflexes.  Psychiatric: She has a normal mood and affect. Her behavior is normal.    BP 113/80 mmHg  Pulse 86  Temp(Src) 98.7 F (37.1 C) (Oral)  Ht 5' (1.524 m)  Wt 174 lb (78.926 kg)  BMI 33.98 kg/m2       Assessment & Plan:  1. High risk medication use Medicines include Keppra and Lamictal as well as Mirapex - CMP14+EGFR  2. Other fatigue Need to check thyroid as well as hemoglobin and potassium - TSH  3. Panic disorder with agoraphobia Will change from Prozac to Celexa and follow symptoms  4. Convulsions, unspecified convulsion type   This is managed by neurology. Seemed like seizures come and go   M  MD  

## 2014-09-19 NOTE — Telephone Encounter (Signed)
Patient informed that lamictal was sent in

## 2014-09-19 NOTE — Telephone Encounter (Signed)
Refilled lamictal- has patient seen new neurologist

## 2014-09-20 LAB — CMP14+EGFR
ALT: 124 IU/L — ABNORMAL HIGH (ref 0–32)
AST: 72 IU/L — ABNORMAL HIGH (ref 0–40)
Albumin/Globulin Ratio: 2 (ref 1.1–2.5)
Albumin: 4.4 g/dL (ref 3.5–5.5)
Alkaline Phosphatase: 64 IU/L (ref 39–117)
BUN/Creatinine Ratio: 16 (ref 9–23)
BUN: 10 mg/dL (ref 6–24)
Bilirubin Total: 0.6 mg/dL (ref 0.0–1.2)
CO2: 26 mmol/L (ref 18–29)
Calcium: 9.7 mg/dL (ref 8.7–10.2)
Chloride: 104 mmol/L (ref 97–108)
Creatinine, Ser: 0.64 mg/dL (ref 0.57–1.00)
GFR calc Af Amer: 121 mL/min/{1.73_m2} (ref >59)
GFR calc non Af Amer: 105 mL/min/{1.73_m2} (ref >59)
Globulin, Total: 2.2 g/dL (ref 1.5–4.5)
Glucose: 85 mg/dL (ref 65–99)
Potassium: 4.2 mmol/L (ref 3.5–5.2)
Sodium: 146 mmol/L — ABNORMAL HIGH (ref 134–144)
Total Protein: 6.6 g/dL (ref 6.0–8.5)

## 2014-09-20 LAB — TSH: TSH: 1.46 u[IU]/mL (ref 0.450–4.500)

## 2014-09-20 NOTE — Telephone Encounter (Signed)
Patient aware that medication was sent.

## 2014-10-23 ENCOUNTER — Emergency Department (HOSPITAL_COMMUNITY)
Admission: EM | Admit: 2014-10-23 | Discharge: 2014-10-23 | Disposition: A | Discharge status: Home / Self Care | Payer: Medicare Other | Attending: Emergency Medicine | Admitting: Emergency Medicine

## 2014-10-23 ENCOUNTER — Encounter (HOSPITAL_COMMUNITY): Payer: Self-pay

## 2014-10-23 DIAGNOSIS — F329 Major depressive disorder, single episode, unspecified: Secondary | ICD-10-CM | POA: Diagnosis not present

## 2014-10-23 DIAGNOSIS — Z8639 Personal history of other endocrine, nutritional and metabolic disease: Secondary | ICD-10-CM | POA: Diagnosis not present

## 2014-10-23 DIAGNOSIS — Z79899 Other long term (current) drug therapy: Secondary | ICD-10-CM | POA: Diagnosis not present

## 2014-10-23 DIAGNOSIS — Z86010 Personal history of colonic polyps: Secondary | ICD-10-CM | POA: Insufficient documentation

## 2014-10-23 DIAGNOSIS — Z91040 Latex allergy status: Secondary | ICD-10-CM | POA: Insufficient documentation

## 2014-10-23 DIAGNOSIS — G40909 Epilepsy, unspecified, not intractable, without status epilepticus: Secondary | ICD-10-CM | POA: Diagnosis present

## 2014-10-23 DIAGNOSIS — R569 Unspecified convulsions: Secondary | ICD-10-CM

## 2014-10-23 DIAGNOSIS — M1990 Unspecified osteoarthritis, unspecified site: Secondary | ICD-10-CM | POA: Diagnosis not present

## 2014-10-23 DIAGNOSIS — Z880 Allergy status to penicillin: Secondary | ICD-10-CM | POA: Diagnosis not present

## 2014-10-23 MED ORDER — SODIUM CHLORIDE 0.9 % IV BOLUS (SEPSIS)
1000.0000 mL | Freq: Once | INTRAVENOUS | Status: AC
  Administered 2014-10-23: 1000 mL via INTRAVENOUS

## 2014-10-23 MED ORDER — LORAZEPAM 2 MG/ML IJ SOLN
1.0000 mg | Freq: Once | INTRAMUSCULAR | Status: AC
  Administered 2014-10-23: 1 mg via INTRAVENOUS
  Filled 2014-10-23: qty 1

## 2014-10-23 MED ORDER — CITALOPRAM HYDROBROMIDE 10 MG PO TABS
20.0000 mg | ORAL_TABLET | Freq: Every day | ORAL | Status: DC
  Administered 2014-10-23: 20 mg via ORAL
  Filled 2014-10-23: qty 2

## 2014-10-23 MED ORDER — LORAZEPAM 2 MG/ML IJ SOLN: 0.5000 mg | Freq: Once | INTRAMUSCULAR | Status: DC

## 2014-10-23 NOTE — ED Notes (Signed)
Pt brought down from [facility] after witnessed seizure.  Seizure like activity lasted aprox. 8 minutes.  No incontinence noted.  Pt is A&Ox4 upon arrival to ED.

## 2014-10-23 NOTE — ED Provider Notes (Signed)
CSN: [id number]     Arrival date & time 10/23/14  1819 History   First MD Initiated Contact with Patient 10/23/14 1822     Chief Complaint  Patient presents with  . Seizures     (Consider location/radiation/quality/duration/timing/severity/associated sxs/prior Treatment) HPI On my initial brief exam, the patient was being brought to the emergency department after being found to have seizure-like activity in this facility. Patient is not capable of providing any details of her history of present illness secondary to ongoing seizure-like activity. Per report the patient was with family members, and waiting while another family member had surgery. Family members reported to emergency responders that the patient has a history of seizures. Level V caveat.   Update: Patient is awake, acknowledges history seizures, states that her son is having surgery. She states that she takes all medication as directed. Today she had what she thinks was a typical seizure for her, and currently has no ongoing complaints, including no new lightheadedness, nausea, confusion, weakness, though she does have ongoing bilateral lower extremity dysesthesia, from known back issues. No new incontinence, no fall after seizure.  Past Medical History  Diagnosis Date  . Vertigo   . Constipation   . Restless leg   . High cholesterol   . Chronic bronchitis ([place])     "yearly; when the weather changes" (04/22/2012)  . KDTOIZTI(458.0)     "1/wk" (04/22/2012)  . Depression   . Epilepsy ([place])     "been having them right often here lately" (04/22/2012)  . Seizures ([place])   . Arthritis     "knees" (04/22/2012)  . Osteoarthritis     [name] 04/22/2012  . Colon polyps     adenomatous and hyperplastic-  . Rectal bleed     in toilet- bright red  . Other convulsions 05/21/12    non-epileptic spells   Past Surgical History  Procedure Laterality Date  . Total hip arthroplasty Left 1993; 1995; 2000  . Abdominal hysterectomy   2001  . Colonoscopy    . Joint replacement    . Mass excision  10/22/2011    Procedure: EXCISION MASS;  Surgeon: [name], MD;  Location: [facility];  Service: General;  Laterality: Right;  excision right buttock mass  . Bladder suspension    . Cesarean section  1987; 1988  . Bunionectomy Left 2000   Family History  Problem Relation Age of Onset  . Cancer Mother   . Cancer Brother   . Diabetes Brother   . Cancer Maternal Aunt   . Diabetes Maternal Aunt    Social History  Substance Use Topics  . Smoking status: Never Smoker   . Smokeless tobacco: Never Used  . Alcohol Use: No   OB History    No data available     Review of Systems  Constitutional:       Per HPI, otherwise negative  HENT:       Per HPI, otherwise negative  Respiratory:       Per HPI, otherwise negative  Cardiovascular:       Per HPI, otherwise negative  Gastrointestinal: Negative for vomiting.  Endocrine:       Negative aside from HPI  Genitourinary:       Neg aside from HPI   Musculoskeletal:       Per HPI, otherwise negative  Skin: Negative for wound.  Neurological: Positive for seizures and weakness. Negative for syncope.      Allergies  Vimpat; Benadryl; Dilantin; Melatonin;  Ultram; Vicodin; Betadine; Codeine; Latex; Penicillins; and Tape  Home Medications   Prior to Admission medications   Medication Sig Start Date End Date Taking? Authorizing Provider  Alum & Mag Hydroxide-Simeth (MAGIC MOUTHWASH) SOLN Take 5 mLs by mouth 4 (four) times daily. 11/15/13   [name], FNP  citalopram (CELEXA) 20 MG tablet Take 1 tablet (20 mg total) by mouth daily. 09/19/14   Wardell Honour, MD  clonazePAM (KLONOPIN) 0.5 MG tablet Take 1 tablet (0.5 mg total) by mouth 3 (three) times daily. 09/19/14   Wardell Honour, MD  gabapentin (NEURONTIN) 600 MG tablet Take 600-1,200 mg by mouth See admin instructions. Take 1 capsule (600 mg) 3 times daily with meals and take 2 capsules (1200 mg) at  bedtime total 3000 mg per day. 08/17/13   Asencion Partridge Dohmeier, MD  Iron-Vitamins (GERITOL PO) Take 1 tablet by mouth daily.    Historical Provider, MD  lamoTRIgine (LAMICTAL) 100 MG tablet Take 1 tablet (100 mg total) by mouth 2 (two) times daily. Keep 12 hours apart. 08/21/14   Asencion Partridge Dohmeier, MD  lamoTRIgine (LAMICTAL) 100 MG tablet TAKE ONE TABLET BY MOUTH TWICE DAILY 09/19/14   [name], FNP  levETIRAcetam (KEPPRA) 500 MG tablet Take 1 tablet (500 mg total) by mouth 2 (two) times daily. 02/20/14   Dennie Bible, NP  meloxicam (MOBIC) 15 MG tablet Take 1 tablet (15 mg total) by mouth daily. 07/12/14   Sharion Balloon, FNP  pramipexole (MIRAPEX) 0.5 MG tablet TAKE ONE TABLET BY MOUTH TWICE DAILY 07/18/14   Christy A Hawks, FNP   BP 118/76 mmHg  Pulse 89  Temp(Src) 98.5 F (36.9 C) (Oral)  Resp 21  Ht 5\' 1"  (1.549 m)  Wt 173 lb (78.472 kg)  BMI 32.70 kg/m2  SpO2 97%    Physical Exam  Constitutional: She is oriented to person, place, and time. She appears well-developed and well-nourished. No distress.  HENT:  Head: Normocephalic and atraumatic.  No oropharyngeal trauma  Eyes: Conjunctivae and EOM are normal.  Cardiovascular: Normal rate and regular rhythm.   Pulmonary/Chest: Effort normal and breath sounds normal. No stridor. No respiratory distress.  Abdominal: She exhibits no distension.  Musculoskeletal: She exhibits no edema.  Neurological: She is alert and oriented to person, place, and time. No cranial nerve deficit. She exhibits normal muscle tone. Coordination normal.  Skin: Skin is warm and dry.  Psychiatric: She has a normal mood and affect.  Nursing note and vitals reviewed.   ED Course  Procedures (including critical care time)   o2- 95%ra, nml   7:34 PM Patient in no distress  8:13 PM Patient remains in NAD MDM  Patient with a history of seizures presents after a witnessed seizure. Patient awakens here, has no ongoing seizure activity, no other  complaints, no changes from baseline. After.  Of monitoring, IV fluid resuscitation, the patient was discharged in stable condition.  Carmin Muskrat, MD 10/23/14 2013

## 2014-10-23 NOTE — ED Notes (Signed)
Pt was witnessed by family and this rn to have seizure like activity lasting <1 minute. Pt is now alert and oriented x4.

## 2014-10-23 NOTE — ED Notes (Signed)
Pt left with all belongings and ambulated out of treatment area.  

## 2014-10-23 NOTE — Discharge Instructions (Signed)
As discussed, your evaluation today has been largely reassuring.  But, it is important that you monitor your condition carefully, and do not hesitate to return to the ED if you develop new, or concerning changes in your condition. ? ?Otherwise, please follow-up with your physician for appropriate ongoing care. ? ?

## 2014-10-25 ENCOUNTER — Telehealth: Payer: Self-pay | Admitting: Nurse Practitioner

## 2014-10-25 NOTE — Telephone Encounter (Signed)
Pt was seen at ED on 10/10, who suggested she call her neurologist for a muscle relaxer for her seizures. The neurologist informed her to contact her primary care provider. She was given Ativan in the ED the other night.

## 2014-10-26 ENCOUNTER — Telehealth: Payer: Self-pay | Admitting: Nurse Practitioner

## 2014-10-26 MED ORDER — CYCLOBENZAPRINE HCL 10 MG PO TABS: 10.0000 mg | ORAL_TABLET | Freq: Three times a day (TID) | ORAL | Status: DC | PRN

## 2014-10-26 NOTE — Telephone Encounter (Signed)
Please call patient and tell her ativan is not a muscle relaxor and is that what is is wanting

## 2014-10-26 NOTE — Telephone Encounter (Signed)
Ativan is not a muscle relaxor- is that what she is asking for?

## 2014-10-26 NOTE — Telephone Encounter (Signed)
Left message stating the Rx was sent to the pharmacy.

## 2014-10-26 NOTE — Telephone Encounter (Signed)
Stp and advised we are waiting for provider to authorize the muscle relaxer. Pt has another open encounter, will close this one.

## 2014-10-26 NOTE — Telephone Encounter (Signed)
Pt is wanting a muscle relaxer, she has taken flexeril in the past that has helped.

## 2014-10-26 NOTE — Telephone Encounter (Signed)
Flexeril rx sent to pharmacy

## 2014-10-30 ENCOUNTER — Ambulatory Visit (INDEPENDENT_AMBULATORY_CARE_PROVIDER_SITE_OTHER): Payer: Medicare Other | Admitting: Family Medicine

## 2014-10-30 ENCOUNTER — Encounter: Payer: Self-pay | Admitting: Family Medicine

## 2014-10-30 VITALS — BP 113/75 | HR 86 | Temp 97.7°F | Ht 61.0 in | Wt 181.6 lb

## 2014-10-30 DIAGNOSIS — J0121 Acute recurrent ethmoidal sinusitis: Secondary | ICD-10-CM | POA: Diagnosis not present

## 2014-10-30 MED ORDER — SULFAMETHOXAZOLE-TRIMETHOPRIM 800-160 MG PO TABS: 1.0000 | ORAL_TABLET | Freq: Two times a day (BID) | ORAL | Status: DC

## 2014-10-30 NOTE — Progress Notes (Signed)
Subjective:  Patient ID: [name], female    DOB: [birth year]  Age: 49 y.o. MRN: [id number]  CC: Sinusitis   HPI [name] presents for Symptoms include congestion, frontal pain, nasal congestion, no  fever, non productive cough, post nasal drip and sinus pressure with no fever, chills, night sweats or weight loss. Onset of symptoms was four days ago, gradually worsening since that time. Pt.is drinking moderate amounts of fluids.  Dizziness causes off balance, but no movement.    History [name] has a past medical history of Vertigo; Constipation; Restless leg; High cholesterol; Chronic bronchitis ([place]); Headache(784.0); Depression; Epilepsy ([place]); Seizures ([place]); Arthritis; Osteoarthritis; Colon polyps; Rectal bleed; and Other convulsions (05/21/12).   She has past surgical history that includes Total hip arthroplasty (Left, 1993; 1995; 2000); Abdominal hysterectomy (2001); Colonoscopy; Joint replacement; Mass excision (10/22/2011); Bladder suspension; Cesarean section (1987; 1988); and Bunionectomy (Left, 2000).   Her family history includes Cancer in her brother, maternal aunt, and mother; Diabetes in her brother and maternal aunt.She reports that she has never smoked. She has never used smokeless tobacco. She reports that she does not drink alcohol or use illicit drugs.  Outpatient Prescriptions Prior to Visit  Medication Sig Dispense Refill  . Alum & Mag Hydroxide-Simeth (MAGIC MOUTHWASH) SOLN Take 5 mLs by mouth 4 (four) times daily. 150 mL 0  . citalopram (CELEXA) 20 MG tablet Take 1 tablet (20 mg total) by mouth daily. 30 tablet 3  . clonazePAM (KLONOPIN) 0.5 MG tablet Take 1 tablet (0.5 mg total) by mouth 3 (three) times daily. (Patient taking differently: Take 1 mg by mouth at bedtime. ) 90 tablet 1  . cyclobenzaprine (FLEXERIL) 10 MG tablet Take 1 tablet (10 mg total) by mouth 3 (three) times daily as needed for muscle spasms. 30 tablet 1  . Iron-Vitamins (GERITOL PO) Take 1  tablet by mouth daily.    Marland Kitchen lamoTRIgine (LAMICTAL) 100 MG tablet Take 1 tablet (100 mg total) by mouth 2 (two) times daily. Keep 12 hours apart. 180 tablet 3  . lamoTRIgine (LAMICTAL) 100 MG tablet TAKE ONE TABLET BY MOUTH TWICE DAILY 60 tablet 0  . levETIRAcetam (KEPPRA) 500 MG tablet Take 1 tablet (500 mg total) by mouth 2 (two) times daily. 60 tablet 6  . meloxicam (MOBIC) 15 MG tablet Take 1 tablet (15 mg total) by mouth daily. 30 tablet 0  . Multiple Vitamins-Minerals (ALIVE WOMENS 50+) TABS Take 1 tablet by mouth every morning.    . gabapentin (NEURONTIN) 600 MG tablet Take 600-1,200 mg by mouth See admin instructions. Take 1 capsule (600 mg) 3 times daily with meals and take 2 capsules (1200 mg) at bedtime total 3000 mg per day.    . pramipexole (MIRAPEX) 0.5 MG tablet TAKE ONE TABLET BY MOUTH TWICE DAILY 60 tablet 2   No facility-administered medications prior to visit.    ROS Review of Systems  Constitutional: Negative for fever, chills, activity change and appetite change.  HENT: Positive for congestion, postnasal drip, rhinorrhea and sinus pressure. Negative for ear discharge, ear pain, hearing loss, nosebleeds, sneezing and trouble swallowing.   Respiratory: Negative for chest tightness and shortness of breath.   Cardiovascular: Negative for chest pain and palpitations.  Skin: Negative for rash.    Objective:  BP 113/75 mmHg  Pulse 86  Temp(Src) 97.7 F (36.5 C) (Oral)  Ht 5\' 1"  (1.549 m)  Wt 181 lb 9.6 oz (82.373 kg)  BMI 34.33 kg/m2  BP Readings from Last  3 Encounters:  10/30/14 113/75  10/23/14 110/70  09/19/14 113/80    Wt Readings from Last 3 Encounters:  10/30/14 181 lb 9.6 oz (82.373 kg)  10/23/14 173 lb (78.472 kg)  09/19/14 174 lb (78.926 kg)     Physical Exam  Constitutional: She appears well-developed and well-nourished.  HENT:  Head: Normocephalic and atraumatic.  Right Ear: Tympanic membrane and external ear normal. No decreased hearing is  noted.  Left Ear: Tympanic membrane and external ear normal. No decreased hearing is noted.  Nose: Mucosal edema present. Right sinus exhibits no frontal sinus tenderness. Left sinus exhibits no frontal sinus tenderness.  Mouth/Throat: No oropharyngeal exudate or posterior oropharyngeal erythema.  Neck: No Brudzinski's sign noted.  Pulmonary/Chest: Breath sounds normal. No respiratory distress.  Lymphadenopathy:       Head (right side): No preauricular adenopathy present.       Head (left side): No preauricular adenopathy present.       Right cervical: No superficial cervical adenopathy present.      Left cervical: No superficial cervical adenopathy present.    Lab Results  Component Value Date   HGBA1C 5.4 09/08/2013    Lab Results  Component Value Date   WBC 9.3 02/20/2014   HGB 15.0 02/20/2014   HCT 43.6 02/20/2014   PLT 389* 02/20/2014   GLUCOSE 85 09/19/2014   ALT 124* 09/19/2014   AST 72* 09/19/2014   NA 146* 09/19/2014   K 4.2 09/19/2014   CL 104 09/19/2014   CREATININE 0.64 09/19/2014   BUN 10 09/19/2014   CO2 26 09/19/2014   TSH 1.460 09/19/2014   INR 1.09 05/08/2013   HGBA1C 5.4 09/08/2013    No results found.  Assessment & Plan:   [name] was seen today for sinusitis.  Diagnoses and all orders for this visit:  Acute recurrent ethmoidal sinusitis  Other orders -     Discontinue: sulfamethoxazole-trimethoprim (BACTRIM DS,SEPTRA DS) 800-160 MG tablet; Take 1 tablet by mouth 2 (two) times daily.   I am having [name] maintain her Iron-Vitamins (GERITOL PO), magic mouthwash, levETIRAcetam, meloxicam, lamoTRIgine, lamoTRIgine, citalopram, clonazePAM, ALIVE WOMENS 50+, and cyclobenzaprine.  Meds ordered this encounter  Medications  . DISCONTD: sulfamethoxazole-trimethoprim (BACTRIM DS,SEPTRA DS) 800-160 MG tablet    Sig: Take 1 tablet by mouth 2 (two) times daily.    Dispense:  28 tablet    Refill:  0     Follow-up: Return if symptoms worsen or  fail to improve.  [name], M.D.

## 2014-11-01 ENCOUNTER — Telehealth: Payer: Self-pay | Admitting: Family Medicine

## 2014-11-01 ENCOUNTER — Other Ambulatory Visit: Payer: Self-pay | Admitting: Family

## 2014-11-01 MED ORDER — AZITHROMYCIN 250 MG PO TABS: ORAL_TABLET | ORAL | Status: DC

## 2014-11-01 NOTE — Telephone Encounter (Signed)
Wrong p.c.p

## 2014-11-01 NOTE — Telephone Encounter (Signed)
Patient aware and rx sent to pharmacy.  

## 2014-11-01 NOTE — Telephone Encounter (Signed)
Patient of Dr Livia Snellen. He had to leave early today. Please advise on rx change and route to [facility]

## 2014-11-01 NOTE — Telephone Encounter (Signed)
Try given her azithromycin 250 mg, take 2 first day and then 1 every day, dispense 6, no refills. Caryl Pina, MD [facility] 11/01/2014, 2:15 PM

## 2014-11-03 ENCOUNTER — Other Ambulatory Visit: Payer: Self-pay | Admitting: Neurology

## 2014-11-03 NOTE — Telephone Encounter (Signed)
Prescribed at [facility] on 08/05

## 2014-11-14 ENCOUNTER — Ambulatory Visit (INDEPENDENT_AMBULATORY_CARE_PROVIDER_SITE_OTHER): Payer: Medicare Other | Admitting: Family Medicine

## 2014-11-14 ENCOUNTER — Encounter: Payer: Self-pay | Admitting: Family Medicine

## 2014-11-14 ENCOUNTER — Ambulatory Visit (INDEPENDENT_AMBULATORY_CARE_PROVIDER_SITE_OTHER): Payer: Medicare Other

## 2014-11-14 VITALS — BP 104/73 | HR 92 | Temp 97.7°F | Ht 61.0 in | Wt 180.0 lb

## 2014-11-14 DIAGNOSIS — M25562 Pain in left knee: Secondary | ICD-10-CM

## 2014-11-14 DIAGNOSIS — M25511 Pain in right shoulder: Secondary | ICD-10-CM

## 2014-11-14 MED ORDER — PENTAZOCINE-NALOXONE 50-0.5 MG PO TABS: 1.0000 | ORAL_TABLET | Freq: Two times a day (BID) | ORAL | Status: DC | PRN

## 2014-11-14 NOTE — Progress Notes (Signed)
Subjective:  Patient ID: [name], female    DOB: 04-Jan-1966  Age: 49 y.o. MRN: [id number]  CC: Shoulder Pain   HPI MARGRET MOAT presents for right shoulder pain and left knee pain pain has been building for several years. She's actually had an orthopedist see her for the knee and told her that the knee needed surgery. She would like to have a referral back to orthopedist. She would also like to have some relief in the meantime. The right shoulder pain on the other hand is rather new it hit fairly suddenly without any known injury several weeks ago and has been increasing in spite of decreasing use. She has not been performing any therapeutic exercises nor has she been taking medication for it other than the usual medicines she takes is below including Flexeril cyclobenzaprine Neurontin and meloxicam. These just do not seem to be offering adequate relief at this time.  History Raiza has a past medical history of Vertigo; Constipation; Restless leg; High cholesterol; Chronic bronchitis ([place]); Headache(784.0); Depression; Epilepsy ([place]); Seizures ([place]); Arthritis; Osteoarthritis; Colon polyps; Rectal bleed; and Other convulsions (05/21/12).   She has past surgical history that includes Total hip arthroplasty (Left, 1993; 1995; 2000); Abdominal hysterectomy (2001); Colonoscopy; Joint replacement; Mass excision (10/22/2011); Bladder suspension; Cesarean section (1987; 1988); and Bunionectomy (Left, 2000).   Her family history includes Cancer in her brother, maternal aunt, and mother; Diabetes in her brother and maternal aunt.She reports that she has never smoked. She has never used smokeless tobacco. She reports that she does not drink alcohol or use illicit drugs.  Outpatient Prescriptions Prior to Visit  Medication Sig Dispense Refill  . Alum & Mag Hydroxide-Simeth (MAGIC MOUTHWASH) SOLN Take 5 mLs by mouth 4 (four) times daily. 150 mL 0  . citalopram (CELEXA) 20 MG tablet Take 1 tablet (20  mg total) by mouth daily. 30 tablet 3  . clonazePAM (KLONOPIN) 0.5 MG tablet Take 1 tablet (0.5 mg total) by mouth 3 (three) times daily. (Patient taking differently: Take 1 mg by mouth at bedtime. ) 90 tablet 1  . cyclobenzaprine (FLEXERIL) 10 MG tablet Take 1 tablet (10 mg total) by mouth 3 (three) times daily as needed for muscle spasms. 30 tablet 1  . gabapentin (NEURONTIN) 600 MG tablet TAKE ONE TABLET BY MOUTH THREE TIMES DAILY, AND THEN TWO AT BEDTIME 450 tablet 1  . Iron-Vitamins (GERITOL PO) Take 1 tablet by mouth daily.    Marland Kitchen lamoTRIgine (LAMICTAL) 100 MG tablet Take 1 tablet (100 mg total) by mouth 2 (two) times daily. Keep 12 hours apart. 180 tablet 3  . levETIRAcetam (KEPPRA) 500 MG tablet Take 1 tablet (500 mg total) by mouth 2 (two) times daily. 60 tablet 6  . meloxicam (MOBIC) 15 MG tablet Take 1 tablet (15 mg total) by mouth daily. 30 tablet 0  . Multiple Vitamins-Minerals (ALIVE WOMENS 50+) TABS Take 1 tablet by mouth every morning.    . pramipexole (MIRAPEX) 0.5 MG tablet TAKE ONE TABLET BY MOUTH TWICE DAILY 60 tablet 3  . azithromycin (ZITHROMAX) 250 MG tablet Take 2 tablets today and then 1 tablet day 2-5. (Patient not taking: Reported on 11/14/2014) 6 tablet 0  . lamoTRIgine (LAMICTAL) 100 MG tablet TAKE ONE TABLET BY MOUTH TWICE DAILY (Patient not taking: Reported on 11/14/2014) 60 tablet 0   No facility-administered medications prior to visit.    ROS Review of Systems  Constitutional: Negative for fever, chills, diaphoresis, appetite change, fatigue and unexpected weight  change.  HENT: Negative for congestion, ear pain, hearing loss, postnasal drip, rhinorrhea, sneezing, sore throat and trouble swallowing.   Eyes: Negative for pain.  Respiratory: Negative for cough, chest tightness and shortness of breath.   Cardiovascular: Negative for chest pain and palpitations.  Gastrointestinal: Negative for nausea, vomiting, abdominal pain, diarrhea and constipation.  Genitourinary:  Negative for dysuria, frequency and menstrual problem.  Musculoskeletal: Positive for myalgias and arthralgias. Negative for joint swelling.  Skin: Negative for rash.  Neurological: Negative for dizziness, weakness, numbness and headaches.  Psychiatric/Behavioral: Negative for dysphoric mood and agitation.    Objective:  BP 104/73 mmHg  Pulse 92  Temp(Src) 97.7 F (36.5 C) (Oral)  Ht 5\' 1"  (1.549 m)  Wt 180 lb (81.647 kg)  BMI 34.03 kg/m2  BP Readings from Last 3 Encounters:  11/14/14 104/73  10/30/14 113/75  10/23/14 110/70    Wt Readings from Last 3 Encounters:  11/14/14 180 lb (81.647 kg)  10/30/14 181 lb 9.6 oz (82.373 kg)  10/23/14 173 lb (78.472 kg)     Physical Exam  Constitutional: She is oriented to person, place, and time. She appears well-developed and well-nourished. No distress.  HENT:  Head: Normocephalic and atraumatic.  Right Ear: External ear normal.  Left Ear: External ear normal.  Nose: Nose normal.  Mouth/Throat: Oropharynx is clear and moist.  Eyes: Conjunctivae and EOM are normal. Pupils are equal, round, and reactive to light.  Neck: Normal range of motion. Neck supple. No thyromegaly present.  Cardiovascular: Normal rate, regular rhythm and normal heart sounds.   No murmur heard. Pulmonary/Chest: Effort normal and breath sounds normal. No respiratory distress. She has no wheezes. She has no rales.  Abdominal: Soft. Bowel sounds are normal. She exhibits no distension. There is no tenderness.  Musculoskeletal: Tenderness: there is tenderness at the anterior joint line of the left knee as well as at the right shoulder for rotation and resisted abduction and adduction.  Lymphadenopathy:    She has no cervical adenopathy.  Neurological: She is alert and oriented to person, place, and time. She has normal reflexes.  Skin: Skin is warm and dry.  Psychiatric: She has a normal mood and affect. Her behavior is normal. Judgment and thought content normal.      Lab Results  Component Value Date   HGBA1C 5.4 09/08/2013    Lab Results  Component Value Date   WBC 9.3 02/20/2014   HGB 15.0 02/20/2014   HCT 43.6 02/20/2014   PLT 389* 02/20/2014   GLUCOSE 85 09/19/2014   ALT 124* 09/19/2014   AST 72* 09/19/2014   NA 146* 09/19/2014   K 4.2 09/19/2014   CL 104 09/19/2014   CREATININE 0.64 09/19/2014   BUN 10 09/19/2014   CO2 26 09/19/2014   TSH 1.460 09/19/2014   INR 1.09 05/08/2013   HGBA1C 5.4 09/08/2013    No results found.  Assessment & Plan:   [place] was seen today for shoulder pain.  Diagnoses and all orders for this visit:  Pain in joint of right shoulder -     DG Shoulder Right  Left knee pain -     DG Knee 1-2 Views Left  Other orders -     pentazocine-naloxone (TALWIN NX) 50-0.5 MG tablet; Take 1 tablet by mouth 2 (two) times daily as needed for pain.   I have discontinued Ms. Guarnieri's azithromycin. I am also having her start on pentazocine-naloxone. Additionally, I am having her maintain her Iron-Vitamins (GERITOL PO), magic  mouthwash, levETIRAcetam, meloxicam, lamoTRIgine, citalopram, clonazePAM, ALIVE WOMENS 50+, cyclobenzaprine, pramipexole, and gabapentin.  Meds ordered this encounter  Medications  . pentazocine-naloxone (TALWIN NX) 50-0.5 MG tablet    Sig: Take 1 tablet by mouth 2 (two) times daily as needed for pain.    Dispense:  60 tablet    Refill:  2     Follow-up: Return in about 3 months (around 02/14/2015) for Arthritis, Pain.  [name], M.D.

## 2014-11-15 ENCOUNTER — Telehealth: Payer: Self-pay | Admitting: Family Medicine

## 2014-11-15 ENCOUNTER — Telehealth: Payer: Self-pay | Admitting: Nurse Practitioner

## 2014-11-15 NOTE — Telephone Encounter (Signed)
Since it has an opiate base, she will have to bring back the original prescription. Alternatively you can speak to the pharmacist and confirm that they destroyed the prescription. After one of those is done I can write for something else.

## 2014-11-15 NOTE — Telephone Encounter (Signed)
Stp and she states the pain medication (talwin NX) you wrote for her yesterday will cost her $130 and she wants to know if there is anything else she can have instead. Please advise?

## 2014-11-15 NOTE — Telephone Encounter (Signed)
I can't replace the prescription with something else if she brings in that prescription or if the pharmacist confirms that they destroyed it. However due to its narcotic content I cannot replace it otherwise. Thanks, WS.

## 2014-11-16 ENCOUNTER — Telehealth: Payer: Self-pay | Admitting: Family Medicine

## 2014-11-16 MED ORDER — OXYCODONE HCL 5 MG PO TABS: 5.0000 mg | ORAL_TABLET | ORAL | Status: DC | PRN

## 2014-11-16 NOTE — Telephone Encounter (Signed)
Prescription for Percocet written. I'll sign and she can pick up. Thanks, WS.

## 2014-11-16 NOTE — Telephone Encounter (Signed)
Lm to call back - we need the RX back - so that something else can be written.

## 2014-11-16 NOTE — Telephone Encounter (Signed)
rx called into pharmacy

## 2014-11-16 NOTE — Telephone Encounter (Signed)
Dr Livia Snellen,  Spoke with pt - [name] has RX and they will shred -- this was confirmed by Peter Congo at the [facility].   Please write out NEW rx for something that may be cheaper - call pt when ready for pick up.

## 2014-11-16 NOTE — Telephone Encounter (Signed)
Pt notifed Verbalizes understanding

## 2014-11-21 ENCOUNTER — Other Ambulatory Visit: Payer: Self-pay | Admitting: Family Medicine

## 2014-11-22 NOTE — Telephone Encounter (Signed)
Last filled 10/20/14, last seen 11/14/14. Call in at [facility]

## 2014-11-22 NOTE — Telephone Encounter (Signed)
Please review and advise.

## 2014-11-23 ENCOUNTER — Telehealth: Payer: Self-pay | Admitting: Nurse Practitioner

## 2014-11-23 NOTE — Telephone Encounter (Signed)
Pt given appt for tomorrow at 3:10.

## 2014-11-23 NOTE — Telephone Encounter (Signed)
NTBS if wants pain meds

## 2014-11-24 ENCOUNTER — Ambulatory Visit (INDEPENDENT_AMBULATORY_CARE_PROVIDER_SITE_OTHER): Payer: Medicare Other | Admitting: Family

## 2014-11-24 ENCOUNTER — Encounter: Payer: Self-pay | Admitting: Family

## 2014-11-24 VITALS — BP 113/79 | HR 92 | Temp 97.8°F | Ht 61.0 in | Wt 180.0 lb

## 2014-11-24 DIAGNOSIS — M25572 Pain in left ankle and joints of left foot: Secondary | ICD-10-CM | POA: Diagnosis not present

## 2014-11-24 MED ORDER — METHYLPREDNISOLONE ACETATE 80 MG/ML IJ SUSP
80.0000 mg | Freq: Once | INTRAMUSCULAR | Status: AC
  Administered 2014-11-24: 80 mg via INTRAMUSCULAR

## 2014-11-24 MED ORDER — KETOROLAC TROMETHAMINE 60 MG/2ML IM SOLN
60.0000 mg | Freq: Once | INTRAMUSCULAR | Status: AC
  Administered 2014-11-24: 60 mg via INTRAMUSCULAR

## 2014-11-24 NOTE — Progress Notes (Signed)
   Subjective:    Patient ID: [name], female    DOB: 20-Jan-1965, 49 y.o.   MRN: [id number]     HPI Pt presents to the office today for recurrent left ankle pain. Pt had a negative x-ray on 04/10/14. Pt states her ankle seems to be getting worse. Pt states she was in bed all day because she can not walk on her foot. Pt states she is falling, but this is related to her "knees giving out". Pt states she goes to her Ortho doctor about this on Monday. Pt states she was given oxycodone with no relief.   Review of Systems  Constitutional: Negative.   HENT: Negative.   Eyes: Negative.   Respiratory: Negative.  Negative for shortness of breath.   Cardiovascular: Negative.  Negative for palpitations.  Gastrointestinal: Negative.   Endocrine: Negative.   Genitourinary: Negative.   Musculoskeletal: Negative.   Neurological: Negative.  Negative for headaches.  Hematological: Negative.   Psychiatric/Behavioral: Negative.   All other systems reviewed and are negative.      Objective:   Physical Exam  Constitutional: She is oriented to person, place, and time. She appears well-developed and well-nourished. No distress.  HENT:  Head: Normocephalic and atraumatic.  Eyes: Pupils are equal, round, and reactive to light.  Neck: Normal range of motion. Neck supple. No thyromegaly present.  Cardiovascular: Normal rate, regular rhythm, normal heart sounds and intact distal pulses.   No murmur heard. Pulmonary/Chest: Effort normal and breath sounds normal. No respiratory distress. She has no wheezes.  Abdominal: Soft. Bowel sounds are normal. She exhibits no distension. There is no tenderness.  Musculoskeletal: Normal range of motion. She exhibits edema (2+ swelling in left ankle) and tenderness.  Neurological: She is alert and oriented to person, place, and time. She has normal reflexes. No cranial nerve deficit.  Skin: Skin is warm and dry.  Psychiatric: She has a normal mood and affect. Her  behavior is normal. Judgment and thought content normal.  Vitals reviewed.    BP 113/79 mmHg  Pulse 92  Temp(Src) 97.8 F (36.6 C) (Oral)  Ht $R'5\' 1"'Vf$  (1.549 m)  Wt 180 lb (81.647 kg)  BMI 34.03 kg/m2      Assessment & Plan:  1. Left ankle pain -Rest -Labs pending Keep Ortho appt on Monday -Falls precaution discussed - Arthritis Panel - CMP14+EGFR - methylPREDNISolone acetate (DEPO-MEDROL) injection 80 mg; Inject 1 mL (80 mg total) into the muscle once. - ketorolac (TORADOL) injection 60 mg; Inject 2 mLs (60 mg total) into the muscle once.   Flu vaccine given today Evelina Dun, FNP

## 2014-11-24 NOTE — Patient Instructions (Signed)

## 2014-11-25 ENCOUNTER — Other Ambulatory Visit: Payer: Self-pay | Admitting: Nurse Practitioner

## 2014-11-27 ENCOUNTER — Encounter: Payer: Self-pay | Admitting: Family Medicine

## 2014-11-27 ENCOUNTER — Ambulatory Visit (INDEPENDENT_AMBULATORY_CARE_PROVIDER_SITE_OTHER): Payer: Medicare Other | Admitting: Family Medicine

## 2014-11-27 VITALS — BP 103/74 | HR 67 | Temp 98.0°F | Ht 61.0 in | Wt 180.2 lb

## 2014-11-27 DIAGNOSIS — S93401A Sprain of unspecified ligament of right ankle, initial encounter: Secondary | ICD-10-CM | POA: Diagnosis not present

## 2014-11-27 DIAGNOSIS — Z23 Encounter for immunization: Secondary | ICD-10-CM | POA: Diagnosis not present

## 2014-11-27 MED ORDER — OXYCODONE HCL 5 MG PO TABS: 5.0000 mg | ORAL_TABLET | Freq: Three times a day (TID) | ORAL | Status: DC | PRN

## 2014-11-27 MED ORDER — MELOXICAM 15 MG PO TABS: 15.0000 mg | ORAL_TABLET | Freq: Every day | ORAL | Status: DC

## 2014-11-27 NOTE — Progress Notes (Signed)
Subjective:  Patient ID: [name], female    DOB: 10-17-1965  Age: 49 y.o. MRN: [id number]  CC: Knee Pain   HPI PLEASANT SKLENAR presents for left knee pain unchanged. Had a shot at ortho last month. 9/10. No relief with rest. [place] 3 times from instability from it 6 weeks ago.Oxy helped some. Had a rash with the vicodin in the past. Has taken it more recently with no problem. Additionally took oxy with no reaction. Ortho planning knee replacement. Has follow  Up on Dec. 14. Couldn't afford the talwin. Pharmacy called and confirmed shredding the prescription. Additionally she is experiencing pain in the right ankle. No known injury. Pain is moderate. Not relieved by current medication regimen. She is having difficulty performing basic activities of daily living due to painful conditions involving each of the lower extremities.  History Nimrat has a past medical history of Vertigo; Constipation; Restless leg; High cholesterol; Chronic bronchitis ([place]); Headache(784.0); Depression; Epilepsy ([place]); Seizures ([place]); Arthritis; Osteoarthritis; Colon polyps; Rectal bleed; and Other convulsions (05/21/12).   She has past surgical history that includes Total hip arthroplasty (Left, 1993; 1995; 2000); Abdominal hysterectomy (2001); Colonoscopy; Joint replacement; Mass excision (10/22/2011); Bladder suspension; Cesarean section (1987; 1988); and Bunionectomy (Left, 2000).   Her family history includes Cancer in her brother, maternal aunt, and mother; Diabetes in her brother and maternal aunt.She reports that she has never smoked. She has never used smokeless tobacco. She reports that she does not drink alcohol or use illicit drugs.  Outpatient Prescriptions Prior to Visit  Medication Sig Dispense Refill  . Alum & Mag Hydroxide-Simeth (MAGIC MOUTHWASH) SOLN Take 5 mLs by mouth 4 (four) times daily. 150 mL 0  . citalopram (CELEXA) 20 MG tablet Take 1 tablet (20 mg total) by mouth daily. 30 tablet 3  .  cyclobenzaprine (FLEXERIL) 10 MG tablet TAKE ONE TABLET BY MOUTH THREE TIMES DAILY AS NEEDED FOR  MUSCLE  SPASMS 30 tablet 0  . gabapentin (NEURONTIN) 600 MG tablet TAKE ONE TABLET BY MOUTH THREE TIMES DAILY, AND THEN TWO AT BEDTIME 450 tablet 1  . Iron-Vitamins (GERITOL PO) Take 1 tablet by mouth daily.    Marland Kitchen lamoTRIgine (LAMICTAL) 100 MG tablet Take 1 tablet (100 mg total) by mouth 2 (two) times daily. Keep 12 hours apart. 180 tablet 3  . levETIRAcetam (KEPPRA) 500 MG tablet Take 1 tablet (500 mg total) by mouth 2 (two) times daily. 60 tablet 6  . Multiple Vitamins-Minerals (ALIVE WOMENS 50+) TABS Take 1 tablet by mouth every morning.    . pramipexole (MIRAPEX) 0.5 MG tablet TAKE ONE TABLET BY MOUTH TWICE DAILY 60 tablet 3  . clonazePAM (KLONOPIN) 0.5 MG tablet Take 1 tablet (0.5 mg total) by mouth 3 (three) times daily. (Patient taking differently: Take 1 mg by mouth at bedtime. ) 90 tablet 1  . meloxicam (MOBIC) 15 MG tablet Take 1 tablet (15 mg total) by mouth daily. 30 tablet 0  . oxyCODONE (ROXICODONE) 5 MG immediate release tablet Take 1 tablet (5 mg total) by mouth every 4 (four) hours as needed for severe pain. (Patient not taking: Reported on 11/24/2014) 30 tablet 0  . pentazocine-naloxone (TALWIN NX) 50-0.5 MG tablet Take 1 tablet by mouth 2 (two) times daily as needed for pain. (Patient not taking: Reported on 11/24/2014) 60 tablet 2   No facility-administered medications prior to visit.    ROS Review of Systems  Constitutional: Negative for fever, activity change and appetite change.  HENT: Negative  for congestion, rhinorrhea and sore throat.   Eyes: Negative for pain and visual disturbance.  Respiratory: Negative for cough and shortness of breath.   Gastrointestinal: Negative for nausea and abdominal pain.  Musculoskeletal: Positive for myalgias and arthralgias.    Objective:  BP 103/74 mmHg  Pulse 67  Temp(Src) 98 F (36.7 C) (Oral)  Ht 5\' 1"  (1.549 m)  Wt 180 lb 3.2 oz  (81.738 kg)  BMI 34.07 kg/m2  BP Readings from Last 3 Encounters:  11/27/14 103/74  11/24/14 113/79  11/14/14 104/73    Wt Readings from Last 3 Encounters:  11/27/14 180 lb 3.2 oz (81.738 kg)  11/24/14 180 lb (81.647 kg)  11/14/14 180 lb (81.647 kg)     Physical Exam  Constitutional: She appears well-developed and well-nourished.  HENT:  Head: Normocephalic.  Cardiovascular: Normal rate and regular rhythm.   No murmur heard. Pulmonary/Chest: Effort normal and breath sounds normal.  Musculoskeletal: She exhibits edema (at the right ankle) and tenderness ( moderately severe forPassive range of motion at the left knee. Varus and valgus stress are exquisitely painful but do not open excessively. McMurray and Lachman tests are negative.).    Lab Results  Component Value Date   HGBA1C 5.4 09/08/2013    Lab Results  Component Value Date   WBC 9.3 02/20/2014   HGB 15.0 02/20/2014   HCT 43.6 02/20/2014   PLT 389* 02/20/2014   GLUCOSE 85 09/19/2014   ALT 124* 09/19/2014   AST 72* 09/19/2014   NA 146* 09/19/2014   K 4.2 09/19/2014   CL 104 09/19/2014   CREATININE 0.64 09/19/2014   BUN 10 09/19/2014   CO2 26 09/19/2014   TSH 1.460 09/19/2014   INR 1.09 05/08/2013   HGBA1C 5.4 09/08/2013    No results found.  Assessment & Plan:   Tayler was seen today for knee pain.  Diagnoses and all orders for this visit:  Right ankle sprain, initial encounter -     meloxicam (MOBIC) 15 MG tablet; Take 1 tablet (15 mg total) by mouth daily.  Other orders -     Flu Vaccine QUAD 36+ mos IM -     oxyCODONE (ROXICODONE) 5 MG immediate release tablet; Take 1 tablet (5 mg total) by mouth 3 (three) times daily as needed for severe pain.   I have discontinued Ms. Andress's pentazocine-naloxone. I have also changed her oxyCODONE. Additionally, I am having her maintain her Iron-Vitamins (GERITOL PO), magic mouthwash, levETIRAcetam, lamoTRIgine, citalopram, ALIVE WOMENS 50+, pramipexole,  gabapentin, cyclobenzaprine, and meloxicam.  Meds ordered this encounter  Medications  . meloxicam (MOBIC) 15 MG tablet    Sig: Take 1 tablet (15 mg total) by mouth daily.    Dispense:  30 tablet    Refill:  2  . oxyCODONE (ROXICODONE) 5 MG immediate release tablet    Sig: Take 1 tablet (5 mg total) by mouth 3 (three) times daily as needed for severe pain.    Dispense:  50 tablet    Refill:  0     Follow-up: Return in about 2 weeks (around 12/11/2014) for Pain.  [name], M.D.

## 2014-12-14 ENCOUNTER — Telehealth: Payer: Self-pay | Admitting: Family Medicine

## 2014-12-14 NOTE — Telephone Encounter (Signed)
Patient's knee pain is being treated by ortho.  She saw them in Oct for knee pain and is going back on Dec 14th for another injection. Left message that it would be more appropriate for her to contact their office for pain medication to manage the knee pain since they are in the process of treating it and that she should contact their office with this request.

## 2014-12-15 ENCOUNTER — Encounter: Payer: Self-pay | Admitting: Family Medicine

## 2014-12-15 ENCOUNTER — Ambulatory Visit (INDEPENDENT_AMBULATORY_CARE_PROVIDER_SITE_OTHER): Payer: Medicare Other | Admitting: Family Medicine

## 2014-12-15 VITALS — BP 133/92 | HR 98 | Temp 98.2°F | Ht 61.0 in | Wt 175.8 lb

## 2014-12-15 DIAGNOSIS — J0101 Acute recurrent maxillary sinusitis: Secondary | ICD-10-CM | POA: Diagnosis not present

## 2014-12-15 DIAGNOSIS — R059 Cough, unspecified: Secondary | ICD-10-CM

## 2014-12-15 DIAGNOSIS — R05 Cough: Secondary | ICD-10-CM

## 2014-12-15 DIAGNOSIS — R52 Pain, unspecified: Secondary | ICD-10-CM

## 2014-12-15 LAB — POCT INFLUENZA A/B
Influenza A, POC: NEGATIVE
Influenza B, POC: NEGATIVE

## 2014-12-15 MED ORDER — PSEUDOEPHEDRINE-GUAIFENESIN ER 60-600 MG PO TB12: 1.0000 | ORAL_TABLET | Freq: Two times a day (BID) | ORAL | Status: AC

## 2014-12-15 MED ORDER — LEVOFLOXACIN 500 MG PO TABS: 500.0000 mg | ORAL_TABLET | Freq: Every day | ORAL | Status: DC

## 2014-12-15 NOTE — Progress Notes (Signed)
Subjective:  Patient ID: [name], female    DOB: Mar 25, 1965  Age: 49 y.o. MRN: [id number]  CC: URI   HPI OLICIA GUGLIELMETTI presents for Patient presents with upper respiratory congestion. Rhinorrhea that is frequently purulent. There is moderate sore throat. Patient reports coughing occasionally without sputum noted. There are no fever no chills no sweats. The patient denies being short of breath. Onset was 3-5 days ago. Gradually worsening in spite of home remedies.   History Leeyana has a past medical history of Vertigo; Constipation; Restless leg; High cholesterol; Chronic bronchitis ([place]); Headache(784.0); Depression; Epilepsy ([place]); Seizures ([place]); Arthritis; Osteoarthritis; Colon polyps; Rectal bleed; and Other convulsions (05/21/12).   She has past surgical history that includes Total hip arthroplasty (Left, 1993; 1995; 2000); Abdominal hysterectomy (2001); Colonoscopy; Joint replacement; Mass excision (10/22/2011); Bladder suspension; Cesarean section (1987; 1988); and Bunionectomy (Left, 2000).   Her family history includes Cancer in her brother, maternal aunt, and mother; Diabetes in her brother and maternal aunt.She reports that she has never smoked. She has never used smokeless tobacco. She reports that she does not drink alcohol or use illicit drugs.  Outpatient Prescriptions Prior to Visit  Medication Sig Dispense Refill  . Alum & Mag Hydroxide-Simeth (MAGIC MOUTHWASH) SOLN Take 5 mLs by mouth 4 (four) times daily. 150 mL 0  . citalopram (CELEXA) 20 MG tablet Take 1 tablet (20 mg total) by mouth daily. 30 tablet 3  . clonazePAM (KLONOPIN) 0.5 MG tablet TAKE ONE TABLET BY MOUTH THREE TIMES DAILY 90 tablet 0  . cyclobenzaprine (FLEXERIL) 10 MG tablet TAKE ONE TABLET BY MOUTH THREE TIMES DAILY AS NEEDED FOR  MUSCLE  SPASMS 30 tablet 0  . gabapentin (NEURONTIN) 600 MG tablet TAKE ONE TABLET BY MOUTH THREE TIMES DAILY, AND THEN TWO AT BEDTIME 450 tablet 1  . Iron-Vitamins (GERITOL  PO) Take 1 tablet by mouth daily.    Marland Kitchen lamoTRIgine (LAMICTAL) 100 MG tablet Take 1 tablet (100 mg total) by mouth 2 (two) times daily. Keep 12 hours apart. 180 tablet 3  . levETIRAcetam (KEPPRA) 500 MG tablet Take 1 tablet (500 mg total) by mouth 2 (two) times daily. 60 tablet 6  . meloxicam (MOBIC) 15 MG tablet Take 1 tablet (15 mg total) by mouth daily. 30 tablet 2  . Multiple Vitamins-Minerals (ALIVE WOMENS 50+) TABS Take 1 tablet by mouth every morning.    Marland Kitchen oxyCODONE (ROXICODONE) 5 MG immediate release tablet Take 1 tablet (5 mg total) by mouth 3 (three) times daily as needed for severe pain. 50 tablet 0  . pramipexole (MIRAPEX) 0.5 MG tablet TAKE ONE TABLET BY MOUTH TWICE DAILY 60 tablet 3   No facility-administered medications prior to visit.    ROS Review of Systems  Constitutional: Negative for fever, chills, activity change and appetite change.  HENT: Positive for congestion, postnasal drip, rhinorrhea and sinus pressure. Negative for ear discharge, ear pain, hearing loss, nosebleeds, sneezing and trouble swallowing.   Respiratory: Negative for chest tightness and shortness of breath.   Cardiovascular: Negative for chest pain and palpitations.  Skin: Negative for rash.    Objective:  BP 133/92 mmHg  Pulse 98  Temp(Src) 98.2 F (36.8 C) (Oral)  Ht 5\' 1"  (1.549 m)  Wt 175 lb 12.8 oz (79.742 kg)  BMI 33.23 kg/m2  SpO2 97%  BP Readings from Last 3 Encounters:  12/15/14 133/92  11/27/14 103/74  11/24/14 113/79    Wt Readings from Last 3 Encounters:  12/15/14 175 lb  12.8 oz (79.742 kg)  11/27/14 180 lb 3.2 oz (81.738 kg)  11/24/14 180 lb (81.647 kg)     Physical Exam  Constitutional: She appears well-developed and well-nourished.  HENT:  Head: Normocephalic and atraumatic.  Right Ear: Tympanic membrane and external ear normal. No decreased hearing is noted.  Left Ear: Tympanic membrane and external ear normal. No decreased hearing is noted.  Nose: Mucosal edema  present. Right sinus exhibits no frontal sinus tenderness. Left sinus exhibits no frontal sinus tenderness.  Mouth/Throat: No oropharyngeal exudate or posterior oropharyngeal erythema.  Neck: No Brudzinski's sign noted.  Pulmonary/Chest: Breath sounds normal. No respiratory distress.  Lymphadenopathy:       Head (right side): No preauricular adenopathy present.       Head (left side): No preauricular adenopathy present.       Right cervical: No superficial cervical adenopathy present.      Left cervical: No superficial cervical adenopathy present.     Results for orders placed or performed in visit on 12/15/14  POCT Influenza A/B  Result Value Ref Range   Influenza A, POC Negative Negative   Influenza B, POC Negative Negative     Lab Results  Component Value Date   WBC 9.3 02/20/2014   HGB 15.0 02/20/2014   HCT 43.6 02/20/2014   PLT 389* 02/20/2014   GLUCOSE 85 09/19/2014   ALT 124* 09/19/2014   AST 72* 09/19/2014   NA 146* 09/19/2014   K 4.2 09/19/2014   CL 104 09/19/2014   CREATININE 0.64 09/19/2014   BUN 10 09/19/2014   CO2 26 09/19/2014   TSH 1.460 09/19/2014   INR 1.09 05/08/2013   HGBA1C 5.4 09/08/2013    No results found.  Assessment & Plan:   Antionetta was seen today for uri.  Diagnoses and all orders for this visit:  Cough -     POCT Influenza A/B  Body aches -     POCT Influenza A/B  Acute recurrent maxillary sinusitis  Other orders -     levofloxacin (LEVAQUIN) 500 MG tablet; Take 1 tablet (500 mg total) by mouth daily. -     pseudoephedrine-guaifenesin (MUCINEX D) 60-600 MG 12 hr tablet; Take 1 tablet by mouth every 12 (twelve) hours. As needed for congestion   I am having Ms. Mauri start on levofloxacin and pseudoephedrine-guaifenesin. I am also having her maintain her Iron-Vitamins (GERITOL PO), magic mouthwash, levETIRAcetam, lamoTRIgine, citalopram, ALIVE WOMENS 50+, pramipexole, gabapentin, clonazePAM, cyclobenzaprine, meloxicam, and  oxyCODONE.  Meds ordered this encounter  Medications  . levofloxacin (LEVAQUIN) 500 MG tablet    Sig: Take 1 tablet (500 mg total) by mouth daily.    Dispense:  7 tablet    Refill:  0  . pseudoephedrine-guaifenesin (MUCINEX D) 60-600 MG 12 hr tablet    Sig: Take 1 tablet by mouth every 12 (twelve) hours. As needed for congestion    Dispense:  20 tablet    Refill:  0     Follow-up: Return if symptoms worsen or fail to improve.  [name], M.D.

## 2014-12-18 ENCOUNTER — Other Ambulatory Visit: Payer: Self-pay | Admitting: Nurse Practitioner

## 2014-12-29 ENCOUNTER — Other Ambulatory Visit: Payer: Self-pay | Admitting: Family Medicine

## 2015-01-01 NOTE — Telephone Encounter (Signed)
Last filled 11/28/14, last seen 12/15/14. Stacks pt. Route to pool A, call in at [facility]

## 2015-01-01 NOTE — Telephone Encounter (Signed)
rx called into pharmacy

## 2015-01-02 ENCOUNTER — Telehealth: Payer: Self-pay

## 2015-01-02 NOTE — Telephone Encounter (Signed)
Dr. Maureen Ralphs requested medical clearance for a surgery on pt. Dr. Brett Fairy cleared pt neurologically and asked that her keppra dose be given IV the day of surgery. Faxed back to [facility].

## 2015-01-10 ENCOUNTER — Other Ambulatory Visit: Payer: Self-pay | Admitting: Family Medicine

## 2015-01-10 NOTE — Telephone Encounter (Signed)
Aware, she has refills on muscle relaxers.

## 2015-01-23 ENCOUNTER — Inpatient Hospital Stay (HOSPITAL_COMMUNITY): Payer: PPO

## 2015-01-23 ENCOUNTER — Emergency Department (HOSPITAL_COMMUNITY): Payer: PPO

## 2015-01-23 ENCOUNTER — Other Ambulatory Visit: Payer: Self-pay | Admitting: Family Medicine

## 2015-01-23 ENCOUNTER — Encounter (HOSPITAL_COMMUNITY): Payer: Self-pay | Admitting: Nurse Practitioner

## 2015-01-23 ENCOUNTER — Inpatient Hospital Stay (HOSPITAL_COMMUNITY)
Admission: EM | Admit: 2015-01-23 | Discharge: 2015-01-25 | DRG: 101 | Disposition: A | Discharge status: Home / Self Care | Payer: PPO | Attending: Internal Medicine | Admitting: Internal Medicine

## 2015-01-23 DIAGNOSIS — G2581 Restless legs syndrome: Secondary | ICD-10-CM | POA: Diagnosis not present

## 2015-01-23 DIAGNOSIS — R569 Unspecified convulsions: Secondary | ICD-10-CM | POA: Diagnosis not present

## 2015-01-23 DIAGNOSIS — R402411 Glasgow coma scale score 13-15, in the field [EMT or ambulance]: Secondary | ICD-10-CM | POA: Diagnosis not present

## 2015-01-23 DIAGNOSIS — G8929 Other chronic pain: Secondary | ICD-10-CM | POA: Diagnosis not present

## 2015-01-23 DIAGNOSIS — Z91048 Other nonmedicinal substance allergy status: Secondary | ICD-10-CM

## 2015-01-23 DIAGNOSIS — Z885 Allergy status to narcotic agent status: Secondary | ICD-10-CM

## 2015-01-23 DIAGNOSIS — G40901 Epilepsy, unspecified, not intractable, with status epilepticus: Secondary | ICD-10-CM | POA: Diagnosis not present

## 2015-01-23 DIAGNOSIS — J42 Unspecified chronic bronchitis: Secondary | ICD-10-CM | POA: Diagnosis not present

## 2015-01-23 DIAGNOSIS — F419 Anxiety disorder, unspecified: Secondary | ICD-10-CM | POA: Diagnosis present

## 2015-01-23 DIAGNOSIS — G40401 Other generalized epilepsy and epileptic syndromes, not intractable, with status epilepticus: Secondary | ICD-10-CM | POA: Diagnosis not present

## 2015-01-23 DIAGNOSIS — Z881 Allergy status to other antibiotic agents status: Secondary | ICD-10-CM

## 2015-01-23 DIAGNOSIS — G9340 Encephalopathy, unspecified: Secondary | ICD-10-CM | POA: Diagnosis present

## 2015-01-23 DIAGNOSIS — Z79899 Other long term (current) drug therapy: Secondary | ICD-10-CM

## 2015-01-23 DIAGNOSIS — F321 Major depressive disorder, single episode, moderate: Secondary | ICD-10-CM | POA: Diagnosis present

## 2015-01-23 DIAGNOSIS — Z96642 Presence of left artificial hip joint: Secondary | ICD-10-CM | POA: Diagnosis not present

## 2015-01-23 DIAGNOSIS — R4182 Altered mental status, unspecified: Secondary | ICD-10-CM | POA: Diagnosis not present

## 2015-01-23 DIAGNOSIS — Z880 Allergy status to penicillin: Secondary | ICD-10-CM | POA: Diagnosis not present

## 2015-01-23 DIAGNOSIS — D72829 Elevated white blood cell count, unspecified: Secondary | ICD-10-CM | POA: Diagnosis present

## 2015-01-23 DIAGNOSIS — Z91040 Latex allergy status: Secondary | ICD-10-CM

## 2015-01-23 DIAGNOSIS — Z886 Allergy status to analgesic agent status: Secondary | ICD-10-CM

## 2015-01-23 DIAGNOSIS — R0682 Tachypnea, not elsewhere classified: Secondary | ICD-10-CM | POA: Diagnosis not present

## 2015-01-23 DIAGNOSIS — R000 Tachycardia, unspecified: Secondary | ICD-10-CM | POA: Diagnosis not present

## 2015-01-23 DIAGNOSIS — F329 Major depressive disorder, single episode, unspecified: Secondary | ICD-10-CM | POA: Diagnosis present

## 2015-01-23 DIAGNOSIS — R404 Transient alteration of awareness: Secondary | ICD-10-CM | POA: Diagnosis not present

## 2015-01-23 LAB — CREATININE, SERUM
Creatinine, Ser: 0.64 mg/dL (ref 0.44–1.00)
GFR calc Af Amer: >60 mL/min (ref >60)
GFR calc non Af Amer: >60 mL/min (ref >60)

## 2015-01-23 LAB — MAGNESIUM: Magnesium: 2.1 mg/dL (ref 1.7–2.4)

## 2015-01-23 LAB — CBC
HCT: 41.3 % (ref 36.0–46.0)
HCT: 41.8 % (ref 36.0–46.0)
Hemoglobin: 13.8 g/dL (ref 12.0–15.0)
Hemoglobin: 14 g/dL (ref 12.0–15.0)
MCH: 30.3 pg (ref 26.0–34.0)
MCH: 30.8 pg (ref 26.0–34.0)
MCHC: 33.4 g/dL (ref 30.0–36.0)
MCHC: 33.5 g/dL (ref 30.0–36.0)
MCV: 90.8 fL (ref 78.0–100.0)
MCV: 92.1 fL (ref 78.0–100.0)
Platelets: 299 10*3/uL (ref 150–400)
Platelets: 294 10*3/uL (ref 150–400)
RBC: 4.55 MIL/uL (ref 3.87–5.11)
RBC: 4.54 MIL/uL (ref 3.87–5.11)
RDW: 13.4 % (ref 11.5–15.5)
RDW: 13.6 % (ref 11.5–15.5)
WBC: 10.7 10*3/uL — ABNORMAL HIGH (ref 4.0–10.5)
WBC: 10.1 10*3/uL (ref 4.0–10.5)

## 2015-01-23 LAB — RAPID URINE DRUG SCREEN, HOSP PERFORMED
Amphetamines: NOT DETECTED
Barbiturates: NOT DETECTED
Benzodiazepines: POSITIVE — AB
Cocaine: NOT DETECTED
Opiates: NOT DETECTED
Tetrahydrocannabinol: NOT DETECTED

## 2015-01-23 LAB — ACETAMINOPHEN LEVEL: Acetaminophen (Tylenol), Serum: <10 ug/mL — ABNORMAL LOW (ref 10–30)

## 2015-01-23 LAB — ETHANOL: Alcohol, Ethyl (B): <5 mg/dL (ref <5)

## 2015-01-23 LAB — COMPREHENSIVE METABOLIC PANEL
ALT: 54 U/L (ref 14–54)
AST: 51 U/L — ABNORMAL HIGH (ref 15–41)
Albumin: 4.1 g/dL (ref 3.5–5.0)
Alkaline Phosphatase: 60 U/L (ref 38–126)
Anion gap: 12 (ref 5–15)
BUN: 9 mg/dL (ref 6–20)
CO2: 25 mmol/L (ref 22–32)
Calcium: 9.4 mg/dL (ref 8.9–10.3)
Chloride: 106 mmol/L (ref 101–111)
Creatinine, Ser: 0.66 mg/dL (ref 0.44–1.00)
GFR calc Af Amer: >60 mL/min (ref >60)
GFR calc non Af Amer: >60 mL/min (ref >60)
Glucose, Bld: 102 mg/dL — ABNORMAL HIGH (ref 65–99)
Potassium: 3.7 mmol/L (ref 3.5–5.1)
Sodium: 143 mmol/L (ref 135–145)
Total Bilirubin: 0.7 mg/dL (ref 0.3–1.2)
Total Protein: 6.7 g/dL (ref 6.5–8.1)

## 2015-01-23 LAB — CBG MONITORING, ED: Glucose-Capillary: 86 mg/dL (ref 65–99)

## 2015-01-23 LAB — URINALYSIS, ROUTINE W REFLEX MICROSCOPIC
Bilirubin Urine: NEGATIVE
Glucose, UA: NEGATIVE mg/dL
Hgb urine dipstick: NEGATIVE
Ketones, ur: NEGATIVE mg/dL
Leukocytes, UA: NEGATIVE
Nitrite: NEGATIVE
Protein, ur: NEGATIVE mg/dL
Specific Gravity, Urine: 1.025 (ref 1.005–1.030)
pH: 7.5 (ref 5.0–8.0)

## 2015-01-23 LAB — SALICYLATE LEVEL: Salicylate Lvl: <4 mg/dL (ref 2.8–30.0)

## 2015-01-23 MED ORDER — SODIUM CHLORIDE 0.9 % IJ SOLN
3.0000 mL | Freq: Two times a day (BID) | INTRAMUSCULAR | Status: DC
  Administered 2015-01-24 – 2015-01-25 (×3): 3 mL via INTRAVENOUS

## 2015-01-23 MED ORDER — SODIUM CHLORIDE 0.9 % IV SOLN: INTRAVENOUS | Status: DC

## 2015-01-23 MED ORDER — SODIUM CHLORIDE 0.9 % IV SOLN
1000.0000 mg | INTRAVENOUS | Status: AC
  Administered 2015-01-23: 1000 mg via INTRAVENOUS
  Filled 2015-01-23: qty 10

## 2015-01-23 MED ORDER — ONDANSETRON HCL 4 MG/2ML IJ SOLN: 4.0000 mg | Freq: Four times a day (QID) | INTRAMUSCULAR | Status: DC | PRN

## 2015-01-23 MED ORDER — LORAZEPAM 2 MG/ML IJ SOLN: 2.0000 mg | Freq: Once | INTRAMUSCULAR | Status: DC

## 2015-01-23 MED ORDER — ALUM & MAG HYDROXIDE-SIMETH 200-200-20 MG/5ML PO SUSP: 30.0000 mL | Freq: Four times a day (QID) | ORAL | Status: DC | PRN

## 2015-01-23 MED ORDER — SODIUM CHLORIDE 0.9 % IV SOLN
INTRAVENOUS | Status: DC
  Administered 2015-01-23: 18:00:00 via INTRAVENOUS

## 2015-01-23 MED ORDER — LAMOTRIGINE 100 MG PO TABS
100.0000 mg | ORAL_TABLET | Freq: Two times a day (BID) | ORAL | Status: DC
  Administered 2015-01-23 – 2015-01-25 (×4): 100 mg via ORAL
  Filled 2015-01-23 (×4): qty 1

## 2015-01-23 MED ORDER — HYDROMORPHONE HCL 1 MG/ML IJ SOLN
1.0000 mg | INTRAMUSCULAR | Status: DC | PRN
  Administered 2015-01-24 – 2015-01-25 (×7): 1 mg via INTRAVENOUS
  Filled 2015-01-23 (×7): qty 1

## 2015-01-23 MED ORDER — LORAZEPAM 2 MG/ML IJ SOLN
2.0000 mg | INTRAMUSCULAR | Status: AC
  Administered 2015-01-23: 2 mg via INTRAVENOUS
  Filled 2015-01-23: qty 1

## 2015-01-23 MED ORDER — LORAZEPAM 2 MG/ML IJ SOLN
2.0000 mg | INTRAMUSCULAR | Status: AC
Start: 2015-01-23 — End: 2015-01-23
  Administered 2015-01-23: 2 mg via INTRAVENOUS
  Filled 2015-01-23: qty 1

## 2015-01-23 MED ORDER — ONDANSETRON HCL 4 MG PO TABS
4.0000 mg | ORAL_TABLET | Freq: Four times a day (QID) | ORAL | Status: DC | PRN
Start: 2015-01-23 — End: 2015-01-25

## 2015-01-23 MED ORDER — ENOXAPARIN SODIUM 40 MG/0.4ML ~~LOC~~ SOLN
40.0000 mg | Freq: Every day | SUBCUTANEOUS | Status: DC
  Administered 2015-01-24 – 2015-01-25 (×2): 40 mg via SUBCUTANEOUS
  Filled 2015-01-23 (×3): qty 0.4

## 2015-01-23 NOTE — ED Notes (Signed)
EEG at bedside.

## 2015-01-23 NOTE — Progress Notes (Signed)
EEG started- turned into LTM; patient waiting for room. On call tech notified patient will be LTM.

## 2015-01-23 NOTE — H&P (Addendum)
History and Physical        Hospital Admission Note Date: 01/23/2015  Patient name: [name] Medical record number: [id number] Date of birth: 22-Nov-1965 Age: 50 y.o. Gender: female  PCP: [name], MD  Referring physician: Dr Venora Maples   Chief Complaint:  Confused, seizures  HPI: Patient is a 50 year old female with history of seizures, pseudoseizures, arthritis, restless leg syndrome, depression who presented to ED via EMS. Patient is unable to provide any history as she only nods or says yes to all questions. History was obtained from the patient's husband and son. Patient has history of seizures and seizures pseudoseizures, has been on Keppra and Lamictal. Per patient's husband, she was noticed to have several "seizures" described as shaking starting yesterday noon, 4-5 episodes at night, last seizure at 2 PM prior to ED. Since yesterday of known, patient has been confused. Per patient's husband, he also believes that she has taken some of his medications. Patient has history of osteoarthritis, when she runs out of her pain medications, she sometimes gets her husband's pillbox to control her pain. The patient's family did do the pill count at home and found Celexa (2 doses missing), Sirolimus (3 doses missing), Folic acid (1 dose missing), Torsemide (4 doses missing), Protonix (1 dose missing), Montelukast (4 doses missing), Linzess (1 dose missing). ED workup showed CBC with WBC is 10.7 otherwise unremarkable, CMET unremarkable AST slightly elevated at 51. UA negative for UTI. CT head negative.  Review of Systems:  Unable to obtain from the patient due to her mental status  Past Medical History: Past Medical History  Diagnosis Date  . Vertigo   . Constipation   . Restless leg   . High cholesterol   . Chronic bronchitis ([place])     "yearly; when the weather changes"  (04/22/2012)  . [id number])     "1/wk" (04/22/2012)  . Depression   . Epilepsy ([place])     "been having them right often here lately" (04/22/2012)  . Seizures ([place])   . Arthritis     "knees" (04/22/2012)  . Osteoarthritis     [name] 04/22/2012  . Colon polyps     adenomatous and hyperplastic-  . Rectal bleed     in toilet- bright red  . Other convulsions 05/21/12    non-epileptic spells    Past Surgical History  Procedure Laterality Date  . Total hip arthroplasty Left 1993; 1995; 2000  . Abdominal hysterectomy  2001  . Colonoscopy    . Joint replacement    . Mass excision  10/22/2011    Procedure: EXCISION MASS;  Surgeon: [name], MD;  Location: [facility];  Service: General;  Laterality: Right;  excision right buttock mass  . Bladder suspension    . Cesarean section  1987; 1988  . Bunionectomy Left 2000    Medications: Prior to Admission medications   Medication Sig Start Date End Date Taking? Authorizing Provider  Alum & Mag Hydroxide-Simeth (MAGIC MOUTHWASH) SOLN Take 5 mLs by mouth 4 (four) times daily. 11/15/13   [name], FNP  citalopram (CELEXA) 20 MG tablet TAKE ONE TABLET BY MOUTH ONCE DAILY 01/23/15   [name], MD  clonazePAM (KLONOPIN) 0.5 MG tablet TAKE ONE TABLET BY MOUTH THREE TIMES DAILY 01/01/15   Timmothy Euler, MD  cyclobenzaprine (FLEXERIL) 10 MG tablet TAKE ONE TABLET BY MOUTH THREE TIMES DAILY AS NEEDED FOR MUSCLE SPASMS 12/18/14   [name], MD  gabapentin (NEURONTIN) 600 MG tablet TAKE ONE TABLET BY MOUTH THREE TIMES DAILY, AND THEN TWO AT BEDTIME 11/03/14   Asencion Partridge Dohmeier, MD  Iron-Vitamins (GERITOL PO) Take 1 tablet by mouth daily.    Historical Provider, MD  lamoTRIgine (LAMICTAL) 100 MG tablet Take 1 tablet (100 mg total) by mouth 2 (two) times daily. Keep 12 hours apart. 08/21/14   Asencion Partridge Dohmeier, MD  levETIRAcetam (KEPPRA) 500 MG tablet Take 1 tablet (500 mg total) by mouth 2 (two) times daily. 02/20/14   Dennie Bible,  NP  levofloxacin (LEVAQUIN) 500 MG tablet Take 1 tablet (500 mg total) by mouth daily. 12/15/14   [name], MD  meloxicam (MOBIC) 15 MG tablet Take 1 tablet (15 mg total) by mouth daily. 11/27/14   [name], MD  Multiple Vitamins-Minerals (ALIVE WOMENS 50+) TABS Take 1 tablet by mouth every morning.    Historical Provider, MD  oxyCODONE (ROXICODONE) 5 MG immediate release tablet Take 1 tablet (5 mg total) by mouth 3 (three) times daily as needed for severe pain. 11/27/14   [name], MD  pramipexole (MIRAPEX) 0.5 MG tablet TAKE ONE TABLET BY MOUTH TWICE DAILY 11/02/14   Wardell Honour, MD    Allergies:   Allergies  Allergen Reactions  . Acetaminophen Other (See Comments)    Has fatty deposits on liver  . Benadryl [Diphenhydramine Hcl] Other (See Comments)    hyperactivity and seizures  . Codeine Itching and Rash    seizures  . Dilantin [Phenytoin Sodium Extended] Other (See Comments)    Elevated LFT's  . Melatonin Other (See Comments)    seizures  . Ultram [Tramadol Hcl] Other (See Comments)    Seizures  . Vimpat [Lacosamide] Other (See Comments)    Severe dizziness  . Betadine [Povidone Iodine] Rash  . Latex Rash  . Penicillins Itching and Rash  . Sulfa Antibiotics Rash  . Tape Rash    Paper tape please  . Vicodin [Hydrocodone-Acetaminophen] Itching    Social History:  Per family, she has never smoked, or drink alcohol or used any illicit drugs. She lives at home with her family.  Family History: Family History  Problem Relation Age of Onset  . Cancer Mother   . Cancer Brother   . Diabetes Brother   . Cancer Maternal Aunt   . Diabetes Maternal Aunt     Physical Exam: Blood pressure 132/89, pulse 103, temperature 99.3 F (37.4 C), temperature source Rectal, resp. rate 20, SpO2 96 %. General: Alert, awake but confused, mostly nods or says yes to questions, NAD. HEENT: normocephalic, atraumatic, anicteric sclera, pink conjunctiva, pupils equal and  reactive to light and accomodation, oropharynx clear Neck: supple, no masses or lymphadenopathy, no goiter, no bruits  Heart: Regular rate and rhythm, sinus tachycardia Lungs: Clear to auscultation bilaterally, no wheezing, rales or rhonchi. Abdomen: Soft, nontender, nondistended, positive bowel sounds, no masses. Extremities: No clubbing, cyanosis or edema with positive pedal pulses. Neuro: Grossly intact, no focal neurological deficits, strength 5/5 upper and lower extremities bilaterally Psych: confused Skin: no rashes or lesions, warm and dry   LABS on Admission:  Basic Metabolic Panel:  Recent Labs Lab 01/23/15 1343  NA 143  K 3.7  CL 106  CO2 25  GLUCOSE 102*  BUN 9  CREATININE 0.66  CALCIUM 9.4   Liver Function Tests:  Recent Labs Lab 01/23/15 1343  AST 51*  ALT 54  ALKPHOS 60  BILITOT 0.7  PROT 6.7  ALBUMIN 4.1   No results for input(s): LIPASE, AMYLASE in the last 168 hours. No results for input(s): AMMONIA in the last 168 hours. CBC:  Recent Labs Lab 01/23/15 1343  WBC 10.7*  HGB 13.8  HCT 41.3  MCV 90.8  PLT 299   Cardiac Enzymes: No results for input(s): CKTOTAL, CKMB, CKMBINDEX, TROPONINI in the last 168 hours. BNP: Invalid input(s): POCBNP CBG:  Recent Labs Lab 01/23/15 1344  GLUCAP 86    Radiological Exams on Admission:  Ct Head Wo Contrast  01/23/2015  CLINICAL DATA:  50 year old female with seizure versus pseudo seizure activity EXAM: CT HEAD WITHOUT CONTRAST TECHNIQUE: Contiguous axial images were obtained from the base of the skull through the vertex without intravenous contrast. COMPARISON:  CT head 09/01/2014 FINDINGS: Negative for acute intracranial hemorrhage, acute infarction, mass, mass effect, hydrocephalus or midline shift. Gray-white differentiation is preserved throughout. No acute soft tissue or calvarial abnormality. The globes and orbits are symmetric and unremarkable. Normal aeration of the mastoid air cells and  visualized paranasal sinuses. IMPRESSION: Negative head CT. Electronically Signed   By: Jacqulynn Cadet M.D.   On: 01/23/2015 16:07    *I have personally reviewed the images above*  EKG: Independently reviewed.Rate 104, sinus tachycardia, QTC 462   Assessment/Plan Principal Problem:   Acute encephalopathy: Differential diagnosis includes seizures, postictal versus medication effect. No obvious infectious etiology, no respiratory symptoms, UA negative for UTI. Labs unremarkable. CT scan with no acute CVA - neurology consulted, recommended EEG. Stat EEG at the bedside showed status epilepticus per neurology. Admit to stepdown unit. - Patient will be restarted on IV Keppra, further antiseizure medications deferred to neurology - Patient is on Celexa and took extra from her husband's pills, follow QTC. Hold celexa - Continue IV fluids, patient took 4 tabs of husband's torsemide, husband does not know the exact dose of torsemide - Per neurology, no need of MRI brain at this time -Serial Neuro checks, seizure precautions  Active Problems:   Moderate major depression (HCC) - Continue to hold Celexa    Seizures ([place]) - Per #1, follow EEG  Low-grade fever with leukocytosis - Monitor closely, patient may have aspiration from seizures, follow closely if spiking fevers or white count increases, place on IV Zosyn. Obtain blood cultures.  DVT prophylaxis: lovenox  CODE STATUS: full code , discussed with patient's husband  Family Communication: Admission, patients condition and plan of care including tests being ordered have been discussed with the patient's husband and son who indicates understanding and agree with the plan and Code Status  Time Spent on Admission: 7mins   RAI,RIPUDEEP M.D. [facility] 01/23/2015, 5:34 PM Pager: [phone]  If 7PM-7AM, please contact night-coverage [email or web address] Password [email or web address]

## 2015-01-23 NOTE — Consult Note (Signed)
Neurology Consultation Reason for Consult: Altered mental status Referring Physician: Venora Maples, K  CC: Altered mental status  History is obtained from: Patient, husband, son  HPI: [name] is a 50 y.o. female with a history of spells. She was diagnosed in September 2002 with generalized tonic-clonic seizures and spike wave discharges occurring at 3 Hz. She was then admitted in November with suspected pseudoseizures. 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  Last night she was laying in bed and unable to sleep. She then began having a seizure which woke her husband. This morning, he noticed that she was still confused, and has had multiple seizures   ROS:  Unable to obtain due to altered mental status.   Past Medical History  Diagnosis Date  . Vertigo   . Constipation   . Restless leg   . High cholesterol   . Chronic bronchitis ([place])     "yearly; when the weather changes" (04/22/2012)  . [id number])     "1/wk" (04/22/2012)  . Depression   . Epilepsy ([place])     "been having them right often here lately" (04/22/2012)  . Seizures ([place])   . Arthritis     "knees" (04/22/2012)  . Osteoarthritis     [name] 04/22/2012  . Colon polyps     adenomatous and hyperplastic-  . Rectal bleed     in toilet- bright red  . Other convulsions 05/21/12    non-epileptic spells     Family History  Problem Relation Age of Onset  . Cancer Mother   . Cancer Brother   . Diabetes Brother   . Cancer Maternal Aunt   . Diabetes Maternal Aunt      Social History:  reports that she has never smoked. She has never used smokeless tobacco. She reports that she does not drink alcohol or use illicit drugs.   Exam: Current vital signs: BP 132/89 mmHg  Pulse 103  Temp(Src) 99.3 F (37.4 C) (Rectal)  Resp 20  SpO2 96% Vital signs in last 24 hours: Temp:  [99 F (37.2 C)-99.3 F (37.4 C)] 99.3 F  (37.4 C) (01/10 1357) Pulse Rate:  [101-110] 103 (01/10 1636) Resp:  [16-24] 20 (01/10 1636) BP: (122-132)/(85-96) 132/89 mmHg (01/10 1636) SpO2:  [95 %-98 %] 96 % (01/10 1636)  Physical Exam  Constitutional: Appears well-developed and well-nourished.  Psych: Flattened affect Eyes: No scleral injection HENT: No OP obstrucion Head: Normocephalic.  Cardiovascular: Normal rate and regular rhythm.  Respiratory: Effort normal and breath sounds normal to anterior ascultation GI: Soft.  No distension. There is no tenderness.  Skin: WDI  Neuro: Mental Status: Patient is awake, but only intermittently follows commands, she has one-word answers, when she does answer. Cranial Nerves: II: Visual Fields are full. Pupils are equal, round, and reactive to light.   III,IV, VI: EOMI without ptosis or diploplia.  V: Facial sensation is symmetric to temperature VII: Facial movement is symmetric.  VIII: hearing is intact to voice X: Uvula elevates symmetrically XI: Shoulder shrug is symmetric. XII: tongue is midline without atrophy or fasciculations.  Motor: Tone is normal. Bulk is normal. 5/5 strength was present in all four extremities.  Sensory: Sensation is symmetric to light touch and temperature in the arms and legs. Cerebellar: She has no clear ataxia on finger nose   I have reviewed labs in epic and the results pertinent to this consultation are: CMP-unremarkable  I have reviewed the images obtained: CT head-no acute findings  Impression: 50 year old female with altered mental status in the setting of multiple seizures earlier today. She does not have a seizure since 2 PM, but remains quite confused. Given her history of generalized epilepsy we will obtain a stat EEG to rule out possible Status Epilepticus.  Recommendations: 1) stat EEG 2) further recommendations following the EEG   Roland Rack, MD [facility] [phone]  If 7pm- 7am, please page  neurology on call as listed in [place].

## 2015-01-23 NOTE — ED Notes (Signed)
Neuro at bedside.

## 2015-01-23 NOTE — ED Notes (Signed)
Assisted Brooke, RN with in and out cath; urine was collected by Jerene Pitch, Therapist, sports and sent to lab for testing; patient tolerated well and resting at this time

## 2015-01-23 NOTE — Progress Notes (Signed)
On review of EEG after being connected, the patient had generalized polyspike and wave discharges which greatly improved following administration of Ativan. Also, the patient became much more fluent in speech and less confused following administration of Ativan. There remains some rhythmic frontal theta activity and she has received a total of 1 g of Keppra as well as 4 mg of Ativan. She continues to be awake without concern for compromised mental status at this time and therefore I will give additional 2 mg IV Ativan and 500 mg Keppra.  We will perform continuous EEG monitoring and titrate medications accordingly, however with her improvement and preserved mental status at this time, I would not favor aggressive intervention with intubation or burst suppression but would prefer treatment with antiepileptics.  Continue home lamotrigine, would hold home gabapentin as this can worsen certain generalized epilepsies.Roland Rack, MD [facility] [phone]  If 7pm- 7am, please page neurology on call as listed in [place].

## 2015-01-23 NOTE — ED Notes (Signed)
Pt family member just told this RN that family member took 60 pills at home - uncertain as to what pills were taken prior to EMS arrival, states this was the reason why they called 911. Dr. Venora Maples aware.

## 2015-01-23 NOTE — ED Notes (Signed)
Pt attempted to obtain urine specimen on bedpan - unable to do so.

## 2015-01-23 NOTE — ED Notes (Signed)
Contacted lab to add-on salicylate and acetaminophen level

## 2015-01-23 NOTE — ED Notes (Signed)
Patient undressed and placed in gown; seizure pads placed on bedrails

## 2015-01-23 NOTE — ED Notes (Signed)
This RN entered room, pt had removed gauze over head and removed several EEG leads. Contacted night neuro MD - Ok to leave EEG leads off until pt gets bed, then contact EEG tech to come in and replace leads.

## 2015-01-23 NOTE — ED Notes (Signed)
Placed patient on monitor

## 2015-01-23 NOTE — ED Notes (Signed)
Per EMS pt from home, family sts pt had multiple seizures has history of seizures and pseudoseizures per EMS. sts family std patient was staring off and not responding and believes pt had pseudoseizures. No inconitnence or injury noted patient aphasic at present time. Upon EMS arrival patient was not following commands but is now following simple commands.

## 2015-01-23 NOTE — ED Provider Notes (Addendum)
CSN: [id number]     Arrival date & time 01/23/15  1246 History   First MD Initiated Contact with Patient 01/23/15 1254     Chief Complaint  Patient presents with  . Seizures   Level V caveat: Altered mental status  HPI Patient presents to the emergency department via EMS.  EMS reports the family stated the patient multiple seizures at home and had staring spells and has a history of seizures as well as pseudoseizures.  Patient is on Keppra and Lamictal.  EMS reports the CBC was without abnormality.  Upon EMS arrival the patient was not able to follow commands but in route the patient began following simple commands.  At this time the patient's following simple commands.  Family then presented to the emergency department stating no seizure activity and reported that the patient was found confused sitting on the couch today.  Family believes that the patient has taken some of her husband's medications.  The patient has a history of osteoarthritis of the knee and they report that when she runs out of her pain medication she goes to her husband's pillboxes attempts to take some of his medications in attempt to control the pain.  Family did a pill count at home and believes she may have taken the medications listed below after appropriate pill counts.   Celexa (2 doses missing) Sirolimus (3 doses missing) Folic acid (1 dose missing) Torsemide (4 doses missing) Protonix (1 dose missing) Montelukast (4 doses missing) Linzess (1 dose missing)  Family reports that they do not believe this was an intention for self-harm his pressure was to control her pain.  At this time the patient only nods her heads to questions.    Past Medical History  Diagnosis Date  . Vertigo   . Constipation   . Restless leg   . High cholesterol   . Chronic bronchitis ([place])     "yearly; when the weather changes" (04/22/2012)  . [id number])     "1/wk" (04/22/2012)  . Depression   . Epilepsy ([place])     "been having  them right often here lately" (04/22/2012)  . Seizures ([place])   . Arthritis     "knees" (04/22/2012)  . Osteoarthritis     [name] 04/22/2012  . Colon polyps     adenomatous and hyperplastic-  . Rectal bleed     in toilet- bright red  . Other convulsions 05/21/12    non-epileptic spells   Past Surgical History  Procedure Laterality Date  . Total hip arthroplasty Left 1993; 1995; 2000  . Abdominal hysterectomy  2001  . Colonoscopy    . Joint replacement    . Mass excision  10/22/2011    Procedure: EXCISION MASS;  Surgeon: [name], MD;  Location: [facility];  Service: General;  Laterality: Right;  excision right buttock mass  . Bladder suspension    . Cesarean section  1987; 1988  . Bunionectomy Left 2000   Family History  Problem Relation Age of Onset  . Cancer Mother   . Cancer Brother   . Diabetes Brother   . Cancer Maternal Aunt   . Diabetes Maternal Aunt    Social History  Substance Use Topics  . Smoking status: Never Smoker   . Smokeless tobacco: Never Used  . Alcohol Use: No   OB History    No data available     Review of Systems  Unable to perform ROS: Mental status change      Allergies  Acetaminophen; Benadryl; Codeine; Dilantin; Melatonin; Ultram; Vimpat; Betadine; Latex; Penicillins; Sulfa antibiotics; Tape; and Vicodin  Home Medications   Prior to Admission medications   Medication Sig Start Date End Date Taking? Authorizing Provider  Alum & Mag Hydroxide-Simeth (MAGIC MOUTHWASH) SOLN Take 5 mLs by mouth 4 (four) times daily. 11/15/13   [name], FNP  citalopram (CELEXA) 20 MG tablet Take 1 tablet (20 mg total) by mouth daily. 09/19/14   Wardell Honour, MD  clonazePAM (KLONOPIN) 0.5 MG tablet TAKE ONE TABLET BY MOUTH THREE TIMES DAILY 01/01/15   Timmothy Euler, MD  cyclobenzaprine (FLEXERIL) 10 MG tablet TAKE ONE TABLET BY MOUTH THREE TIMES DAILY AS NEEDED FOR MUSCLE SPASMS 12/18/14   [name], MD  gabapentin (NEURONTIN) 600 MG  tablet TAKE ONE TABLET BY MOUTH THREE TIMES DAILY, AND THEN TWO AT BEDTIME 11/03/14   Asencion Partridge Dohmeier, MD  Iron-Vitamins (GERITOL PO) Take 1 tablet by mouth daily.    Historical Provider, MD  lamoTRIgine (LAMICTAL) 100 MG tablet Take 1 tablet (100 mg total) by mouth 2 (two) times daily. Keep 12 hours apart. 08/21/14   Asencion Partridge Dohmeier, MD  levETIRAcetam (KEPPRA) 500 MG tablet Take 1 tablet (500 mg total) by mouth 2 (two) times daily. 02/20/14   Dennie Bible, NP  levofloxacin (LEVAQUIN) 500 MG tablet Take 1 tablet (500 mg total) by mouth daily. 12/15/14   [name], MD  meloxicam (MOBIC) 15 MG tablet Take 1 tablet (15 mg total) by mouth daily. 11/27/14   [name], MD  Multiple Vitamins-Minerals (ALIVE WOMENS 50+) TABS Take 1 tablet by mouth every morning.    Historical Provider, MD  oxyCODONE (ROXICODONE) 5 MG immediate release tablet Take 1 tablet (5 mg total) by mouth 3 (three) times daily as needed for severe pain. 11/27/14   [name], MD  pramipexole (MIRAPEX) 0.5 MG tablet TAKE ONE TABLET BY MOUTH TWICE DAILY 11/02/14   Wardell Honour, MD   BP 131/91 mmHg  Pulse 110  Temp(Src) 99 F (37.2 C) (Oral)  Resp 16  SpO2 97% Physical Exam  Constitutional: She appears well-developed and well-nourished. No distress.  HENT:  Head: Normocephalic and atraumatic.  Eyes: EOM are normal.  Neck: Normal range of motion.  Cardiovascular: Normal rate, regular rhythm and normal heart sounds.   Pulmonary/Chest: Effort normal and breath sounds normal.  Abdominal: Soft. She exhibits no distension. There is no tenderness.  Musculoskeletal: Normal range of motion.  Neurological: She is alert.  Follow simple commands.  Moves all 4 extremities equally.  Occasional one-word answer.  Skin: Skin is warm and dry.  Psychiatric: She has a normal mood and affect. Judgment normal.  Nursing note and vitals reviewed.   ED Course  Procedures (including critical care time) Labs Review Labs Reviewed   CBC - Abnormal; Notable for the following:    WBC 10.7 (*)    All other components within normal limits  URINE RAPID DRUG SCREEN, HOSP PERFORMED - Abnormal; Notable for the following:    Benzodiazepines POSITIVE (*)    All other components within normal limits  COMPREHENSIVE METABOLIC PANEL - Abnormal; Notable for the following:    Glucose, Bld 102 (*)    AST 51 (*)    All other components within normal limits  ETHANOL  URINALYSIS, ROUTINE W REFLEX MICROSCOPIC (NOT AT [facility])  SALICYLATE LEVEL  ACETAMINOPHEN LEVEL  CBG MONITORING, ED    Imaging Review Ct Head Wo Contrast  01/23/2015  CLINICAL DATA:  50 year old female with  seizure versus pseudo seizure activity EXAM: CT HEAD WITHOUT CONTRAST TECHNIQUE: Contiguous axial images were obtained from the base of the skull through the vertex without intravenous contrast. COMPARISON:  CT head 09/01/2014 FINDINGS: Negative for acute intracranial hemorrhage, acute infarction, mass, mass effect, hydrocephalus or midline shift. Gray-white differentiation is preserved throughout. No acute soft tissue or calvarial abnormality. The globes and orbits are symmetric and unremarkable. Normal aeration of the mastoid air cells and visualized paranasal sinuses. IMPRESSION: Negative head CT. Electronically Signed   By: Jacqulynn Cadet M.D.   On: 01/23/2015 16:07   I have personally reviewed and evaluated these images and lab results as part of my medical decision-making.   EKG Interpretation   Date/Time:  Tuesday January 23 2015 13:46:32 EST Ventricular Rate:  104 PR Interval:  128 QRS Duration: 99 QT Interval:  351 QTC Calculation: 462 R Axis:   51 Text Interpretation:  Sinus tachycardia Consider left atrial enlargement  Borderline repolarization abnormality Baseline wander in lead(s) V5 V6 No  significant change was found Confirmed by Viggo Perko  MD, Lennette Bihari ([id number]) on  01/23/2015 4:19:29 PM      MDM   Final diagnoses:  None    Altered mental  status.  Questionable polypharmacy.  Head CT negative.  Labs without significant abnormality.  Salicylate and Tylenol levels added on.  We'll admit the patient for observation for ongoing evaluation.  If she does not improve she'll likely need an MRI scan of her brain.  She is not a candidate for TPA.  This could represent expressive aphasia/receptive aphasia  4:43 PM Dr Leonel Ramsay of neurology will evaluate as well  Jola Schmidt, MD 01/23/15 [place], MD 01/23/15 [phone]

## 2015-01-23 NOTE — ED Notes (Signed)
Contacted pharmacy to send Miranda ASAP

## 2015-01-23 NOTE — ED Notes (Signed)
EDP at bedside  

## 2015-01-24 DIAGNOSIS — F321 Major depressive disorder, single episode, moderate: Secondary | ICD-10-CM

## 2015-01-24 DIAGNOSIS — G40901 Epilepsy, unspecified, not intractable, with status epilepticus: Secondary | ICD-10-CM | POA: Diagnosis not present

## 2015-01-24 LAB — CBC
HCT: 40.9 % (ref 36.0–46.0)
Hemoglobin: 14 g/dL (ref 12.0–15.0)
MCH: 30.8 pg (ref 26.0–34.0)
MCHC: 34.2 g/dL (ref 30.0–36.0)
MCV: 89.9 fL (ref 78.0–100.0)
Platelets: 319 10*3/uL (ref 150–400)
RBC: 4.55 MIL/uL (ref 3.87–5.11)
RDW: 13.5 % (ref 11.5–15.5)
WBC: 9.3 10*3/uL (ref 4.0–10.5)

## 2015-01-24 LAB — COMPREHENSIVE METABOLIC PANEL
ALT: 67 U/L — ABNORMAL HIGH (ref 14–54)
AST: 72 U/L — ABNORMAL HIGH (ref 15–41)
Albumin: 4.2 g/dL (ref 3.5–5.0)
Alkaline Phosphatase: 62 U/L (ref 38–126)
Anion gap: 14 (ref 5–15)
BUN: 12 mg/dL (ref 6–20)
CO2: 22 mmol/L (ref 22–32)
Calcium: 9.3 mg/dL (ref 8.9–10.3)
Chloride: 105 mmol/L (ref 101–111)
Creatinine, Ser: 0.66 mg/dL (ref 0.44–1.00)
GFR calc Af Amer: >60 mL/min (ref >60)
GFR calc non Af Amer: >60 mL/min (ref >60)
Glucose, Bld: 93 mg/dL (ref 65–99)
Potassium: 3.7 mmol/L (ref 3.5–5.1)
Sodium: 141 mmol/L (ref 135–145)
Total Bilirubin: 1.3 mg/dL — ABNORMAL HIGH (ref 0.3–1.2)
Total Protein: 6.8 g/dL (ref 6.5–8.1)

## 2015-01-24 LAB — LAMOTRIGINE LEVEL: Lamotrigine Lvl: 3.9 ug/mL (ref 2.0–20.0)

## 2015-01-24 MED ORDER — CLONAZEPAM 0.5 MG PO TABS
0.5000 mg | ORAL_TABLET | Freq: Three times a day (TID) | ORAL | Status: DC
  Administered 2015-01-24 – 2015-01-25 (×4): 0.5 mg via ORAL
  Filled 2015-01-24 (×4): qty 1

## 2015-01-24 MED ORDER — LORAZEPAM 2 MG/ML IJ SOLN
2.0000 mg | Freq: Four times a day (QID) | INTRAMUSCULAR | Status: DC | PRN
  Administered 2015-01-24: 2 mg via INTRAVENOUS
  Filled 2015-01-24: qty 1

## 2015-01-24 MED ORDER — CITALOPRAM HYDROBROMIDE 20 MG PO TABS
20.0000 mg | ORAL_TABLET | Freq: Every day | ORAL | Status: DC
  Administered 2015-01-24 – 2015-01-25 (×2): 20 mg via ORAL
  Filled 2015-01-24 (×2): qty 1

## 2015-01-24 MED ORDER — SODIUM CHLORIDE 0.9 % IV SOLN
1000.0000 mg | Freq: Two times a day (BID) | INTRAVENOUS | Status: DC
  Administered 2015-01-24 – 2015-01-25 (×3): 1000 mg via INTRAVENOUS
  Filled 2015-01-24 (×5): qty 10

## 2015-01-24 NOTE — Progress Notes (Signed)
Occupational Therapy Evaluation Patient Details Name: [name] MRN: [id number] DOB: [birth year] Today's Date: 01/24/2015    History of Present Illness 50 year old female with history of seizures, pseudoseizures, arthritis, restless leg syndrome, depression who presented to ED via EMS Admitted due to seizures and AMS. Per pt's husband, he believes that she has taken some of his medications.   Clinical Impression   PTA,  Pt lived at home with husband and used a "walking stick" at times for mobility and had min A as needed for ADL. Pt most likely close to her baseline level, but no family present during eval. Recommend PT consult for mobility. Will follow acutely to address ADL/DME and facilitate safe D/C home with 24/7 S.     Follow Up Recommendations  No OT follow up;Supervision/Assistance - 24 hour    Equipment Recommendations  Tub/shower bench    Recommendations for Other Services PT consult     Precautions / Restrictions Precautions Precautions: Fall;Other (comment) (seizures)      Mobility Bed Mobility Overal bed mobility: Modified Independent                Transfers Overall transfer level: Needs assistance   Transfers: Sit to/from Stand;Stand Pivot Transfers Sit to Stand: Min guard Stand pivot transfers: Min assist (steadying assist)            Balance Overall balance assessment: Needs assistance   Sitting balance-Leahy Scale: Good       Standing balance-Leahy Scale: Fair                              ADL Overall ADL's : Needs assistance/impaired     Grooming: Set up   Upper Body Bathing: Set up;Sitting   Lower Body Bathing: Minimal assistance;Sit to/from stand   Upper Body Dressing : Set up;Sitting   Lower Body Dressing: Minimal assistance;Sit to/from stand   Toilet Transfer: Minimal assistance Armed forces technical officer Details (indicate cue type and reason): steadying assist (simulated) Toileting- Clothing Manipulation and  Hygiene: Supervision/safety Toileting - Clothing Manipulation Details (indicate cue type and reason): per CNA report     Functional mobility during ADLs: Min guard (limited mobility at this time) General ADL Comments: Pt has difficulty with LB ADL. Pt states this is due to back pain and knee pain.                        Pertinent Vitals/Pain Pain Assessment: No/denies pain     Hand Dominance Right   Extremity/Trunk Assessment Upper Extremity Assessment Upper Extremity Assessment: Overall WFL for tasks assessed   Lower Extremity Assessment Lower Extremity Assessment: Defer to PT evaluation   Cervical / Trunk Assessment Cervical / Trunk Assessment: Normal   Communication Communication Communication: No difficulties   Cognition Arousal/Alertness: Suspect due to medications;Lethargic Behavior During Therapy: Flat affect;Impulsive Overall Cognitive Status: No family/caregiver present to determine baseline cognitive functioning                     General Comments   Assessment limited. Per nsg, pt recently given more antiseizure medication due to pseudoseizures. Lethargy most likely related to medication.    Exercises       Shoulder Instructions      Home Living Family/patient expects to be discharged to:: Private residence Living Arrangements: Spouse/significant other Available Help at Discharge: Family;Available 24 hours/day Type of Home: House Home Access: Stairs to enter [employer] of  Steps: 3 Entrance Stairs-Rails: Right;Left Home Layout: One level     Bathroom Shower/Tub: Tub/shower unit Shower/tub characteristics: Architectural technologist: Standard Bathroom Accessibility: Yes How Accessible: Accessible via walker Home Equipment: [place] - 2 wheels;Shower seat;Cane - single point          Prior Functioning/Environment Level of Independence: Needs assistance;Independent with assistive device(s)  Gait / Transfers Assistance  Needed: uses "walking stick" at times ADL's / Homemaking Assistance Needed: husband assists pt to get in/out of tub and putting on her socks when needed Communication / Swallowing Assistance Needed: no difficulty      OT Diagnosis: Generalized weakness   OT Problem List: Decreased activity tolerance;Decreased knowledge of use of DME or AE;Decreased safety awareness;Obesity   OT Treatment/Interventions: Self-care/ADL training;DME and/or AE instruction;Therapeutic activities;Patient/family education    OT Goals(Current goals can be found in the care plan section) Acute Rehab OT Goals Patient Stated Goal: to return home OT Goal Formulation: With patient Time For Goal Achievement: 02/07/15 Potential to Achieve Goals: Good  OT Frequency: Min 2X/week   Barriers to D/C:            Co-evaluation              End of Session Equipment Utilized During Treatment: Gait belt Nurse Communication: Mobility status  Activity Tolerance: Patient tolerated treatment well Patient left: in bed;with call bell/phone within reach;with bed alarm set   Time: [id number] OT Time Calculation (min): 19 min Charges:  OT General Charges $OT Visit: 1 Procedure OT Evaluation $OT Eval Moderate Complexity: 1 Procedure G-Codes:    Roxas Clymer,HILLARY 2015/02/19, 2:04 PM   [facility], OTR/L  [phone] 02-19-15

## 2015-01-24 NOTE — ED Notes (Signed)
Report given to [facility] nurse

## 2015-01-24 NOTE — Progress Notes (Signed)
Subjective: Much improved  Exam: Filed Vitals:   01/24/15 0745 01/24/15 0800  BP: 110/64 129/95  Pulse:  89  Temp:    Resp:  19   Gen: In bed, NAD Resp: non-labored breathing, no acute distress Abd: soft, nt  Neuro: MS: awake, alert, oriented to person, place, month, year, president.  CN: EOMI, VFF Motor: MAEW Sensory:intact to LT  Pertinent Labs: cmp - unremarkable  Impression: 50 yo F presenting with absence status epilepticus, now resolved. I would favor discontinuing gabapentin and increasing her home levetiracetam dose. She is markedly improved this morning.   Recommendations: 1) continue home lamotrigine 2) continue keppra 1gm BID 3) continue home dose clonazepam 4) discontinue gabapentin.   Roland Rack, MD [facility] [phone]  If 7pm- 7am, please page neurology on call as listed in [place].

## 2015-01-24 NOTE — ED Notes (Signed)
Pt was able to get out of her wrist restraints and pulled off all of her EEG leads at this time. Restraints D/C ed at this time.

## 2015-01-24 NOTE — Progress Notes (Signed)
vEEG LTM connected at 11pm. Pt was hooked up and then pt pulled leads off. Reapplied. Neuro notified.

## 2015-01-24 NOTE — Progress Notes (Signed)
PROGRESS NOTE    [name] [id number] DOB: [birth year] DOA: 01/23/2015 PCP: [name], MD/[facility] practice Primary Neurologist: Dr. Velia Meyer   HPI/Brief narrative 50 year old female patient, PMH of seizures, pseudoseizures and depression, was brought to the [facility] ED by EMS on 01/23/15 with complaints of several seizures at home & confusion. As per family, patient had taken some of her spouse's medications (Celexa, sirolimus, folate, torsemide, Protonix, montelukast and Linzess) and apparently does so when her pain medications run out. In the ED, CT head negative. Neurology was consulted and bedside EEG confirmed status epilepticus. She was treated with antiepileptics-IV Ativan and Keppra with good effect. Patient improved. Held in ED due to lack of stepdown beds. Downgraded to neuro telemetry on 1/11.   Assessment/Plan:   Absence status epilepticus - EEG in ED confirmed status epilepticus. - Neurology was consulted and patient was loaded with IV Keppra and received couple doses of IV Ativan. - Now resolved. - Neurology recommends discontinuing gabapentin, continue home dose of lamotrigine, increasing Keppra to 1 g twice a day and continue home dose of clonazepam. Discussed with neuro hospitalist. - Patient does not drive and has not driven in 12 years.  Acute encephalopathy - Most likely secondary to status epilepticus. - CT head without acute findings. UA negative for UTI. - Resolved. Patient did take some of her spouse's medications but does not seem to have had any residual effects. Patient counseled that she should not take other's medications. She verbalized understanding.  Anxiety and depression - Resume home dose of Klonopin and Celexa. QTC 460.  Mildly abnormal LFTs - Appears chronic compared to lab results from 09/19/14. Outpatient follow-up.    DVT prophylaxis: Lovenox Code Status: Full Family Communication: None at bedside Disposition  Plan: Downgraded status to neuro telemetry. Observe overnight. Possible DC home once/12/16.   Consultants:  Neuro hospitalist  Procedures:  EEG 01/23/15  Antimicrobials:  None   Subjective: Denies complaints. Asking for something to drink. States that she has been compliant with her medications and last seizure prior to current episodes was approximately 2 months ago. States that she has not driven in over 12 years. No other new medication changes reported. As per RN, has been alert and coherent for several hours, pulled out and did not keep EEG leads.  Objective: Filed Vitals:   01/24/15 0730 01/24/15 0745 01/24/15 0800 01/24/15 1032  BP: 122/86 110/64 129/95 126/77  Pulse: 95  89 82  Temp:    98.8 F (37.1 C)  TempSrc:    Oral  Resp: 23  19 20   SpO2: 96%  97% 96%   No intake or output data in the 24 hours ending 01/24/15 1158 There were no vitals filed for this visit.  Exam:  General exam: Pleasant middle-aged female lying comfortably propped up on the gurney in the ED. Edentulous. Mucosa moist. Respiratory system: Clear. No increased work of breathing. Cardiovascular system: S1 & S2 heard, RRR. No JVD, murmurs, gallops, clicks or pedal edema. Gastrointestinal system: Abdomen is nondistended, soft and nontender. Normal bowel sounds heard. Central nervous system: Alert and oriented. No focal neurological deficits. Extremities: Symmetric 5 x 5 power.   Data Reviewed: Basic Metabolic Panel:  Recent Labs Lab 01/23/15 1343 01/23/15 1926 01/23/15 2125 01/24/15 0540  NA 143  --   --  141  K 3.7  --   --  3.7  CL 106  --   --  105  CO2 25  --   --  22  GLUCOSE 102*  --   --  93  BUN 9  --   --  12  CREATININE 0.66  --  0.64 0.66  CALCIUM 9.4  --   --  9.3  MG  --  2.1  --   --    Liver Function Tests:  Recent Labs Lab 01/23/15 1343 01/24/15 0540  AST 51* 72*  ALT 54 67*  ALKPHOS 60 62  BILITOT 0.7 1.3*  PROT 6.7 6.8  ALBUMIN 4.1 4.2   No results for  input(s): LIPASE, AMYLASE in the last 168 hours. No results for input(s): AMMONIA in the last 168 hours. CBC:  Recent Labs Lab 01/23/15 1343 01/23/15 2125 01/24/15 0540  WBC 10.7* 10.1 9.3  HGB 13.8 14.0 14.0  HCT 41.3 41.8 40.9  MCV 90.8 92.1 89.9  PLT 299 294 319   Cardiac Enzymes: No results for input(s): CKTOTAL, CKMB, CKMBINDEX, TROPONINI in the last 168 hours. BNP (last 3 results) No results for input(s): PROBNP in the last 8760 hours. CBG:  Recent Labs Lab 01/23/15 1344  GLUCAP 86    No results found for this or any previous visit (from the past 240 hour(s)).       Studies: Ct Head Wo Contrast  01/23/2015  CLINICAL DATA:  50 year old female with seizure versus pseudo seizure activity EXAM: CT HEAD WITHOUT CONTRAST TECHNIQUE: Contiguous axial images were obtained from the base of the skull through the vertex without intravenous contrast. COMPARISON:  CT head 09/01/2014 FINDINGS: Negative for acute intracranial hemorrhage, acute infarction, mass, mass effect, hydrocephalus or midline shift. Gray-white differentiation is preserved throughout. No acute soft tissue or calvarial abnormality. The globes and orbits are symmetric and unremarkable. Normal aeration of the mastoid air cells and visualized paranasal sinuses. IMPRESSION: Negative head CT. Electronically Signed   By: Jacqulynn Cadet M.D.   On: 01/23/2015 16:07        Scheduled Meds: . citalopram  20 mg Oral Daily  . clonazePAM  0.5 mg Oral TID  . enoxaparin (LOVENOX) injection  40 mg Subcutaneous Daily  . lamoTRIgine  100 mg Oral BID  . levETIRAcetam  1,000 mg Intravenous Q12H  . sodium chloride  3 mL Intravenous Q12H   Continuous Infusions: . sodium chloride 1,000 mL (01/24/15 1107)    Principal Problem:   Acute encephalopathy Active Problems:   Moderate major depression ([place])   Seizures ([place])   Status epilepticus ([place])    Time spent: 25 minutes.    Vernell Leep, MD, FACP, FHM. [facility] Pager [phone]  If 7PM-7AM, please contact night-coverage [email or web address] Password [email or web address] 01/24/2015, 11:58 AM    LOS: 1 day

## 2015-01-24 NOTE — Progress Notes (Signed)
STAT EEG done will continue ltm. Neuro notified

## 2015-01-24 NOTE — Procedures (Signed)
History: 50 yo F presenting with confusion.   Sedation: Ativan during early EEG  Technique: This is a 21 channel continuous scalp EEG performed at the bedside with bipolar and monopolar montages arranged in accordance to the international 10/20 system of electrode placement. One channel was dedicated to EKG recording. This recorded from 01/23/15 5:27pm until the patient removed leads at approximately 7:15pm. The leads were reconnected at 11pm and recorded until 6:50 am.    Background: At the onset of EEG, there are generalized, frontally predominant, polyspike discharges occurring in bursts at a frequency of 1-2 Hz with suppression between bursts.   Photic stimulation: Physiologic driving is not performed  EEG Abnormalities: 1) Generalized status epilepticus which broke with treatment.  2) Frontally predominant theta activity 3) occasional interictal generalized epileptiform discharges after cessation of status epilepticus.   Clinical Interpretation: This EEG is consistent with generalized status epilepticus. Given the continued consciousness of the patient, this is most consistent with absence status epilepticus.  Following cessation of the activity, there was continued bifrontal slow activity which could be post-ictal in nature or represent frontal dysfunction.  This did show some improvement over the course of the recording, but did not ever clear completely.   Roland Rack, MD [facility] [phone]  If 7pm- 7am, please page neurology on call as listed in [place].

## 2015-01-24 NOTE — Progress Notes (Signed)
Patient arrived from ED around 1030, alert and oriented with no obvious neurological deficits is on seizure precautions bed rails are padded and O2 and suction are in the room, will continue to monitor.

## 2015-01-25 DIAGNOSIS — F321 Major depressive disorder, single episode, moderate: Secondary | ICD-10-CM | POA: Diagnosis not present

## 2015-01-25 DIAGNOSIS — G40901 Epilepsy, unspecified, not intractable, with status epilepticus: Secondary | ICD-10-CM | POA: Diagnosis not present

## 2015-01-25 MED ORDER — LEVETIRACETAM 500 MG PO TABS: 1000.0000 mg | ORAL_TABLET | Freq: Two times a day (BID) | ORAL | Status: DC

## 2015-01-25 NOTE — Progress Notes (Signed)
D/C orders received, pt for D/C home today.  IV and telemetry D/C.  Rx and D/C instructions given with verbalized understanding.  Family at bedside to assist with D/C.  Staff brought pt downstairs via wheelchair.  

## 2015-01-25 NOTE — Progress Notes (Signed)
Subjective: Much improved  Exam: Filed Vitals:   01/24/15 2123 01/25/15 0541  BP: 111/69 114/72  Pulse: 101 88  Temp: 98.3 F (36.8 C) 97.7 F (36.5 C)  Resp: 18 18   Gen: In bed, NAD Resp: non-labored breathing, no acute distress Abd: soft, nt  Neuro: MS: awake, alert, oriented to person, place, month, year, president.  CN: EOMI, VFF Motor: MAEW Sensory:intact to LT  Pertinent Labs: cmp - unremarkable  Impression: 50 yo F presenting with absence status epilepticus, now resolved. I would favor discontinuing gabapentin and increasing her home levetiracetam dose. She continues to be markedly improved from admission.   Recommendations: 1) continue home lamotrigine 2) continue keppra 1gm BID(increased from home 500mg  BID) 3) continue home dose clonazepam 4) discontinue gabapentin.  5) Follow up with Dr. Brett Fairy at [place], MD [facility] [phone]  If [place], please page neurology on call as listed in [place].

## 2015-01-25 NOTE — Discharge Instructions (Signed)

## 2015-01-25 NOTE — Progress Notes (Signed)
Occupational Therapy Treatment Patient Details Name: [name] MRN: [id number] DOB: [birth year] Today's Date: 01/25/2015    History of present illness 50 year old female with history of seizures, pseudoseizures, arthritis, restless leg syndrome, depression who presented to ED via EMS Admitted due to seizures and AMS. Per pt's husband, he believes that she has taken some of his medications.   OT comments  Pt making good progress toward OT goals. Pt currently at a supervision level for safety; required VCs throughout session for safety with RW and hand placement when coming from sit to stand. Pt able to perform UB and LB dressing with supervision for safety. D/c plan remains appropriate at this time. Will continue to follow pt acutely.    Follow Up Recommendations  No OT follow up;Supervision/Assistance - 24 hour    Equipment Recommendations  Tub/shower bench    Recommendations for Other Services      Precautions / Restrictions Precautions Precautions: Fall Restrictions Weight Bearing Restrictions: No       Mobility Bed Mobility Overal bed mobility: Independent                Transfers Overall transfer level: Needs assistance Equipment used: Rolling walker (2 wheeled) Transfers: Sit to/from Stand Sit to Stand: Supervision         General transfer comment: VCs for safety, technique, and hand placement.    Balance Overall balance assessment: Needs assistance Sitting-balance support: Feet supported;No upper extremity supported Sitting balance-Leahy Scale: Good     Standing balance support: No upper extremity supported;During functional activity Standing balance-Leahy Scale: Fair Standing balance comment: static standing                   ADL Overall ADL's : Needs assistance/impaired     Grooming: Supervision/safety;Standing;Wash/dry hands           Upper Body Dressing : Set up;Sitting   Lower Body Dressing: Supervision/safety;Sit to/from  stand Lower Body Dressing Details (indicate cue type and reason): Pt able to don/doff socks sitting EOB. Toilet Transfer: Supervision/safety;Ambulation;Regular Toilet;RW Toilet Transfer Details (indicate cue type and reason): Educated on technique and hand placement; not pulling up with RW Toileting- Water quality scientist and Hygiene: Supervision/safety;Sit to/from stand       Functional mobility during ADLs: Supervision/safety;Rolling walker General ADL Comments: Pts husband and son present for OT session. Educated on home safety, safety with RW and proper technique; pt verbalized understanding. Son stated he wants move in with parents to be able to be around more and help out; discussed that this may be a good option for him to provide more supervision and assist to pt.       Vision                     Perception     Praxis      Cognition   Behavior During Therapy: WFL for tasks assessed/performed Overall Cognitive Status: Within Functional Limits for tasks assessed                       Extremity/Trunk Assessment  Upper Extremity Assessment Upper Extremity Assessment: Defer to OT evaluation   Lower Extremity Assessment Lower Extremity Assessment: Overall WFL for tasks assessed        Exercises     Shoulder Instructions       General Comments      Pertinent Vitals/ Pain       Pain Assessment: No/denies pain Pain Score: 5  Pain  Location: knees Pain Descriptors / Indicators: Sore Pain Intervention(s): Monitored during session;Limited activity within patient's tolerance;Patient requesting pain meds-RN notified  Home Living Family/patient expects to be discharged to:: Private residence Living Arrangements: Spouse/significant other Available Help at Discharge: Family;Available 24 hours/day Type of Home: House Home Access: Stairs to enter Entrance Stairs-Number of Steps: 3 Entrance Stairs-Rails: Right;Left Home Layout: One level                Home Equipment: Walker - 2 wheels          Prior Functioning/Environment Level of Independence: Independent with assistive device(s)  Gait / Transfers Assistance Needed: walking stick         Frequency Min 2X/week     Progress Toward Goals  OT Goals(current goals can now be found in the care plan section)  Progress towards OT goals: Progressing toward goals  Acute Rehab OT Goals Patient Stated Goal: to return home OT Goal Formulation: With patient  Plan Discharge plan remains appropriate    Co-evaluation                 End of Session Equipment Utilized During Treatment: Rolling walker   Activity Tolerance Patient tolerated treatment well   Patient Left in bed;with call bell/phone within reach;with nursing/sitter in room;with family/visitor present (sitting EOB)   Nurse Communication          [id number] OT Time Calculation (min): 12 min  Charges: OT General Charges $OT Visit: 1 Procedure OT Treatments $Self Care/Home Management : 8-22 mins  Binnie Kand M.S., OTR/L Pager: [phone]  01/25/2015, 2:35 PM

## 2015-01-25 NOTE — Evaluation (Signed)
Physical Therapy Evaluation/Discharge Summary  Patient Details Name: [name] MRN: [id number] DOB: [birth year] Today's Date: 01/25/2015   History of Present Illness  50 year old female with history of seizures, pseudoseizures, arthritis, restless leg syndrome, depression who presented to ED via EMS Admitted due to seizures and AMS. Per pt's husband, he believes that she has taken some of his medications.  Clinical Impression  Pt admitted with above diagnosis. Pt currently with functional limitations due to the deficits listed below (see PT Problem List). Pt will benefit from skilled PT to increase their independence and safety with mobility to allow discharge to the venue listed below.  At this time the patient states that she is at her previous mobility level. She states that there daughter-in-law is going to be staying with her to help as needed. At this time the patient is supervision-independent with her mobility. Recommended to patient that she use a rw at home for additional safety, patient verbalizes understanding. At this time the patient does not present with any ongoing PT needs. Patient in agreement and denies any questions or concerns.      Follow Up Recommendations No PT follow up;Supervision for mobility/OOB    Equipment Recommendations  None recommended by PT;Other (comment) (patient reports having rw at home)    Recommendations for Other Services       Precautions / Restrictions Precautions Precautions: None Restrictions Weight Bearing Restrictions: No      Mobility  Bed Mobility Overal bed mobility: Independent                Transfers Overall transfer level: Modified independent Equipment used: Rolling walker (2 wheeled) Transfers: Sit to/from Stand Sit to Stand: Modified independent (Device/Increase time)         General transfer comment: using rw  Ambulation/Gait Ambulation/Gait assistance: Supervision Ambulation Distance (Feet): 150  Feet Assistive device: Rolling walker (2 wheeled) Gait Pattern/deviations: Step-through pattern Gait velocity: WFL   General Gait Details: no instability noted, even strides.   Stairs            Wheelchair Mobility    Modified Rankin (Stroke Patients Only)       Balance Overall balance assessment: Needs assistance Sitting-balance support: No upper extremity supported Sitting balance-Leahy Scale: Good     Standing balance support: No upper extremity supported Standing balance-Leahy Scale: Fair Standing balance comment: static standing                             Pertinent Vitals/Pain Pain Assessment: 0-10 Pain Score: 5  Pain Location: knees Pain Descriptors / Indicators: Sore Pain Intervention(s): Monitored during session;Limited activity within patient's tolerance;Patient requesting pain meds-RN notified    Home Living Family/patient expects to be discharged to:: Private residence Living Arrangements: Spouse/significant other Available Help at Discharge: Family;Available 24 hours/day Type of Home: House Home Access: Stairs to enter Entrance Stairs-Rails: Psychiatric nurse of Steps: 3 Home Layout: One level Home Equipment: Walker - 2 wheels      Prior Function Level of Independence: Independent with assistive device(s)   Gait / Transfers Assistance Needed: walking stick           Hand Dominance        Extremity/Trunk Assessment   Upper Extremity Assessment: Defer to OT evaluation           Lower Extremity Assessment: Overall WFL for tasks assessed         Communication   Communication: No difficulties  Cognition Arousal/Alertness: Awake/alert Behavior During Therapy: WFL for tasks assessed/performed Overall Cognitive Status: Within Functional Limits for tasks assessed                      General Comments      Exercises        Assessment/Plan    PT Assessment Patent does not need any  further PT services  PT Diagnosis Difficulty walking   PT Problem List    PT Treatment Interventions     PT Goals (Current goals can be found in the Care Plan section) Acute Rehab PT Goals Patient Stated Goal: to return home PT Goal Formulation: With patient Time For Goal Achievement: 02/01/15 Potential to Achieve Goals: Good    Frequency     Barriers to discharge        Co-evaluation               End of Session Equipment Utilized During Treatment: Gait belt Activity Tolerance: Patient tolerated treatment well Patient left: in bed;with call bell/phone within reach;with bed alarm set Nurse Communication: Mobility status         Time: [id number] PT Time Calculation (min) (ACUTE ONLY): 12 min   Charges:   PT Evaluation $PT Eval Moderate Complexity: 1 Procedure     PT G Codes:        Cassell Clement, PT, CSCS Pager [phone] Office [phone]  01/25/2015, 12:22 PM

## 2015-01-25 NOTE — Discharge Summary (Signed)
Physician Discharge Summary  [name] [id number] DOB: Jun 14, 1965 DOA: 01/23/2015  PCP: [name], MD  Admit date: 01/23/2015 Discharge date: 01/25/2015  Time spent: Greater than 30 minutes  Recommendations for Outpatient Follow-up:  1. Dr. [name], PCP in 1 week 2. Dr. Asencion Partridge Dohmeier, Neurology in 1 week. 3. Tub/Shower bench.  Discharge Diagnoses:  Principal Problem:   Acute encephalopathy Active Problems:   Moderate major depression ([place])   Seizures ([place])   Status epilepticus ([place])   Discharge Condition: Improved & Stable  Diet recommendation: Heart healthy diet.  Filed Weights   01/24/15 1400  Weight: 81.874 kg (180 lb 8 oz)    History of present illness:  50 year old female patient, PMH of seizures, pseudoseizures, depression &? Chronic pain, was brought to the [facility] ED by EMS on 01/23/15 with complaints of several seizures at home & confusion. As per family, patient had taken some of her spouse's medications (Celexa, sirolimus, folate, torsemide, Protonix, montelukast and Linzess) and apparently does so when her pain medications run out. In the ED, CT head negative. Neurology was consulted and bedside EEG confirmed status epilepticus. She was treated with antiepileptics-IV Ativan and Keppra with good effect. Patient improved. Held in ED due to lack of stepdown beds. Downgraded to neuro telemetry on 1/11.  Hospital Course:   Absence status epilepticus - EEG in ED confirmed status epilepticus. - Neurology was consulted and patient was loaded with IV Keppra and received couple doses of IV Ativan. - Neurology recommends discontinuing gabapentin, continue home dose of lamotrigine, increasing Keppra to 1 g twice a day and continue home dose of clonazepam.  - Patient does not drive and has not driven in 12 years. - Seen patient today along with neuro hospitalist. Status epilepticus resolved. Patient much improved. DC home with above medication changes and  outpatient follow-up with primary neurologist.  Acute encephalopathy - Most likely secondary to status epilepticus. - CT head without acute findings. UA negative for UTI. - Resolved. Patient did take some of her spouse's medications but does not seem to have had any residual effects. Patient counseled that she should not take other's medications-this was reiterated again today. She verbalized understanding.  Anxiety and depression - Resumed home dose of Klonopin and Celexa.   Mildly abnormal LFTs - Appears chronic compared to lab results from 09/19/14. Outpatient follow-up.   ? Chronic pain - Follow-up with PCP regarding management.    Consultants:  Neuro hospitalist  Procedures:  EEG 01/23/15 EEG Abnormalities: 1) Generalized status epilepticus which broke with treatment.  2) Frontally predominant theta activity 3) occasional interictal generalized epileptiform discharges after cessation of status epilepticus.   Clinical Interpretation: This EEG is consistent with generalized status epilepticus. Given the continued consciousness of the patient, this is most consistent with absence status epilepticus.  Following cessation of the activity, there was continued bifrontal slow activity which could be post-ictal in nature or represent frontal dysfunction. This did show some improvement over the course of the recording, but did not ever clear completely.    Antimicrobials:  None   Discharge Exam:  Complaints: No further seizure-like episodes since yesterday. Denies new complaints. Seems to have chronic pain issues and asking for medications-advised to follow-up with her PCP.  Filed Vitals:   01/24/15 1813 01/24/15 2123 01/25/15 0541 01/25/15 0916  BP: 116/74 111/69 114/72 114/74  Pulse: 101 101 88 93  Temp: 97.4 F (36.3 C) 98.3 F (36.8 C) 97.7 F (36.5 C) 98.7 F (37.1 C)  TempSrc: Axillary Oral  Oral Oral  Resp: 17 18 18 20   Height:      Weight:      SpO2: 98% 95%  99% 96%    General exam: Pleasant middle-aged female lying comfortably in bed. Edentulous. Mucosa moist. Respiratory system: Clear. No increased work of breathing. Cardiovascular system: S1 & S2 heard, RRR. No JVD, murmurs, gallops, clicks or pedal edema. Telemetry: ST in the 100s-SR. Gastrointestinal system: Abdomen is nondistended, soft and nontender. Normal bowel sounds heard. Central nervous system: Alert and oriented. No focal neurological deficits. Extremities: Symmetric 5 x 5 power.  Discharge Instructions      Discharge Instructions    Call MD for:  difficulty breathing, headache or visual disturbances    Complete by:  As directed      Call MD for:  extreme fatigue    Complete by:  As directed      Call MD for:  persistant dizziness or light-headedness    Complete by:  As directed      Call MD for:  persistant nausea and vomiting    Complete by:  As directed      Call MD for:  severe uncontrolled pain    Complete by:  As directed      Call MD for:  temperature >100.4    Complete by:  As directed      Call MD for:    Complete by:  As directed   Seizure like activity or altered mental status.     Diet - low sodium heart healthy    Complete by:  As directed      Discharge instructions    Complete by:  As directed   Patient advised not to drive, operate heavy machinery, perform activities at heights or participate in water activities until release by outpatient physician. This was discussed with the patient who expressed understanding.     Increase activity slowly    Complete by:  As directed             Medication List    STOP taking these medications        gabapentin 600 MG tablet  Commonly known as:  NEURONTIN      TAKE these medications        citalopram 20 MG tablet  Commonly known as:  CELEXA  TAKE ONE TABLET BY MOUTH ONCE DAILY     clonazePAM 0.5 MG tablet  Commonly known as:  KLONOPIN  TAKE ONE TABLET BY MOUTH THREE TIMES DAILY     lamoTRIgine  100 MG tablet  Commonly known as:  LAMICTAL  Take 1 tablet (100 mg total) by mouth 2 (two) times daily. Keep 12 hours apart.     levETIRAcetam 500 MG tablet  Commonly known as:  KEPPRA  Take 2 tablets (1,000 mg total) by mouth 2 (two) times daily.       Follow-up Information    Follow up with STACKS,WARREN, MD. Schedule an appointment as soon as possible for a visit in 1 week.   Specialty:  Family Medicine   Contact information:   [place] [address] [phone]       Follow up with [facility], MD. Schedule an appointment as soon as possible for a visit in 1 week.   Specialty:  Neurology   Contact information:   [address] [place] [id number] [phone]        The results of significant diagnostics from this hospitalization (including imaging, microbiology, ancillary  and laboratory) are listed below for reference.    Significant Diagnostic Studies: Ct Head Wo Contrast  01/23/2015  CLINICAL DATA:  50 year old female with seizure versus pseudo seizure activity EXAM: CT HEAD WITHOUT CONTRAST TECHNIQUE: Contiguous axial images were obtained from the base of the skull through the vertex without intravenous contrast. COMPARISON:  CT head 09/01/2014 FINDINGS: Negative for acute intracranial hemorrhage, acute infarction, mass, mass effect, hydrocephalus or midline shift. Gray-white differentiation is preserved throughout. No acute soft tissue or calvarial abnormality. The globes and orbits are symmetric and unremarkable. Normal aeration of the mastoid air cells and visualized paranasal sinuses. IMPRESSION: Negative head CT. Electronically Signed   By: Jacqulynn Cadet M.D.   On: 01/23/2015 16:07    Microbiology: No results found for this or any previous visit (from the past 240 hour(s)).   Labs: Basic Metabolic Panel:  Recent Labs Lab 01/23/15 1343 01/23/15 1926 01/23/15 2125 01/24/15 0540  NA 143  --   --  141  K 3.7  --   --  3.7  CL  106  --   --  105  CO2 25  --   --  22  GLUCOSE 102*  --   --  93  BUN 9  --   --  12  CREATININE 0.66  --  0.64 0.66  CALCIUM 9.4  --   --  9.3  MG  --  2.1  --   --    Liver Function Tests:  Recent Labs Lab 01/23/15 1343 01/24/15 0540  AST 51* 72*  ALT 54 67*  ALKPHOS 60 62  BILITOT 0.7 1.3*  PROT 6.7 6.8  ALBUMIN 4.1 4.2   No results for input(s): LIPASE, AMYLASE in the last 168 hours. No results for input(s): AMMONIA in the last 168 hours. CBC:  Recent Labs Lab 01/23/15 1343 01/23/15 2125 01/24/15 0540  WBC 10.7* 10.1 9.3  HGB 13.8 14.0 14.0  HCT 41.3 41.8 40.9  MCV 90.8 92.1 89.9  PLT 299 294 319   Cardiac Enzymes: No results for input(s): CKTOTAL, CKMB, CKMBINDEX, TROPONINI in the last 168 hours. BNP: BNP (last 3 results) No results for input(s): BNP in the last 8760 hours.  ProBNP (last 3 results) No results for input(s): PROBNP in the last 8760 hours.  CBG:  Recent Labs Lab 01/23/15 1344  GLUCAP 86    Discussed with spouse- updated care and answered questions.   Signed:  Vernell Leep, MD, FACP, FHM. [facility] Pager [phone]  If 7PM-7AM, please contact night-coverage [email or web address] Password [facility] 01/25/2015, 11:52 AM

## 2015-01-26 LAB — GABAPENTIN LEVEL: Gabapentin Lvl: 2.8 ug/mL

## 2015-01-31 ENCOUNTER — Other Ambulatory Visit: Payer: Self-pay

## 2015-01-31 ENCOUNTER — Ambulatory Visit (INDEPENDENT_AMBULATORY_CARE_PROVIDER_SITE_OTHER): Payer: PPO | Admitting: Family Medicine

## 2015-01-31 ENCOUNTER — Encounter: Payer: Self-pay | Admitting: Family Medicine

## 2015-01-31 DIAGNOSIS — G40901 Epilepsy, unspecified, not intractable, with status epilepticus: Secondary | ICD-10-CM | POA: Diagnosis not present

## 2015-01-31 MED ORDER — CLONAZEPAM 0.5 MG PO TABS: ORAL_TABLET | ORAL | Status: DC

## 2015-01-31 MED ORDER — CLONAZEPAM 0.5 MG PO TABS
ORAL_TABLET | ORAL | Status: DC
Start: 2015-01-31 — End: 2015-01-31

## 2015-01-31 NOTE — Progress Notes (Signed)
Subjective:  Patient ID: [name], female    DOB: [birth year]  Age: 50 y.o. MRN: [id number]  CC: Hospitalization Follow-up   HPI [name] presents for Multiple seizures increasing in frequency until crescendo led to unconsciousness earlier this month. Admitted. Had EEG and meds adjusted. Now feeling better. No seizure since DC from [facility] on 01/25/2015. Discharge summary reveals pt. dxed with encephalopathy secondary to status epilepticus. Status was broken with IV ativan. Gabapentin was DCed and Keppra started, see below. History [name] has a past medical history of Vertigo; Constipation; Restless leg; High cholesterol; Chronic bronchitis ([employer]); Headache(784.0); Depression; Epilepsy ([place]); Seizures ([place]); Arthritis; Osteoarthritis; Colon polyps; Rectal bleed; and Other convulsions (05/21/12).   She has past surgical history that includes Total hip arthroplasty (Left, 1993; 1995; 2000); Abdominal hysterectomy (2001); Colonoscopy; Joint replacement; Mass excision (10/22/2011); Bladder suspension; Cesarean section (1987; 1988); and Bunionectomy (Left, 2000).   Her family history includes Cancer in her brother, maternal aunt, and mother; Diabetes in her brother and maternal aunt.She reports that she has never smoked. She has never used smokeless tobacco. She reports that she does not drink alcohol or use illicit drugs.  Outpatient Prescriptions Prior to Visit  Medication Sig Dispense Refill  . citalopram (CELEXA) 20 MG tablet TAKE ONE TABLET BY MOUTH ONCE DAILY 30 tablet 1  . lamoTRIgine (LAMICTAL) 100 MG tablet Take 1 tablet (100 mg total) by mouth 2 (two) times daily. Keep 12 hours apart. 180 tablet 3  . levETIRAcetam (KEPPRA) 500 MG tablet Take 2 tablets (1,000 mg total) by mouth 2 (two) times daily. 120 tablet 0  . clonazePAM (KLONOPIN) 0.5 MG tablet TAKE ONE TABLET BY MOUTH THREE TIMES DAILY 90 tablet 0   No facility-administered medications prior to visit.    ROS Review of  Systems  Constitutional: Negative for fever, activity change and appetite change.  HENT: Negative for congestion, rhinorrhea and sore throat.   Eyes: Negative for visual disturbance.  Respiratory: Negative for cough and shortness of breath.   Cardiovascular: Negative for chest pain and palpitations.  Gastrointestinal: Negative for nausea, abdominal pain and diarrhea.  Genitourinary: Negative for dysuria.  Musculoskeletal: Negative for myalgias and arthralgias.  Neurological: Positive for weakness and headaches. Negative for syncope, speech difficulty, light-headedness and numbness.    Objective:  BP 113/70 mmHg  Pulse 94  Temp(Src) 97.3 F (36.3 C) (Oral)  Ht '5\' 1"'  (1.549 m)  Wt 173 lb 6.4 oz (78.654 kg)  BMI 32.78 kg/m2  SpO2 97%  BP Readings from Last 3 Encounters:  02/01/15 112/83  01/31/15 113/70  01/25/15 125/90    Wt Readings from Last 3 Encounters:  02/01/15 173 lb (78.472 kg)  01/31/15 173 lb 6.4 oz (78.654 kg)  01/24/15 180 lb 8 oz (81.874 kg)     Physical Exam  Constitutional: She is oriented to person, place, and time. She appears well-developed and well-nourished. No distress.  HENT:  Head: Normocephalic and atraumatic.  Right Ear: External ear normal.  Left Ear: External ear normal.  Nose: Nose normal.  Mouth/Throat: Oropharynx is clear and moist.  Eyes: Conjunctivae and EOM are normal. Pupils are equal, round, and reactive to light.  Neck: Normal range of motion. Neck supple. No thyromegaly present.  Cardiovascular: Normal rate, regular rhythm and normal heart sounds.   No murmur heard. Pulmonary/Chest: Effort normal and breath sounds normal. No respiratory distress. She has no wheezes. She has no rales.  Abdominal: Soft. Bowel sounds are normal. She exhibits no distension.  There is no tenderness.  Lymphadenopathy:    She has no cervical adenopathy.  Neurological: She is alert and oriented to person, place, and time. She has normal reflexes.  Skin:  Skin is warm and dry.  Psychiatric: She has a normal mood and affect. Her behavior is normal. Judgment and thought content normal.     Lab Results  Component Value Date   WBC 6.8 01/31/2015   HGB 14.0 01/24/2015   HCT 38.9 01/31/2015   PLT 307 01/31/2015   GLUCOSE 114* 01/31/2015   ALT 63* 01/31/2015   AST 36 01/31/2015   NA 144 01/31/2015   K 4.6 01/31/2015   CL 105 01/31/2015   CREATININE 0.62 01/31/2015   BUN 10 01/31/2015   CO2 22 01/31/2015   TSH 0.661 01/31/2015   INR 1.09 05/08/2013   HGBA1C 5.4 09/08/2013    Ct Head Wo Contrast  01/23/2015  CLINICAL DATA:  50 year old female with seizure versus pseudo seizure activity EXAM: CT HEAD WITHOUT CONTRAST TECHNIQUE: Contiguous axial images were obtained from the base of the skull through the vertex without intravenous contrast. COMPARISON:  CT head 09/01/2014 FINDINGS: Negative for acute intracranial hemorrhage, acute infarction, mass, mass effect, hydrocephalus or midline shift. Gray-white differentiation is preserved throughout. No acute soft tissue or calvarial abnormality. The globes and orbits are symmetric and unremarkable. Normal aeration of the mastoid air cells and visualized paranasal sinuses. IMPRESSION: Negative head CT. Electronically Signed   By: Jacqulynn Cadet M.D.   On: 01/23/2015 16:07    Assessment & Plan:   Devota was seen today for hospitalization follow-up.  Diagnoses and all orders for this visit:  Status epilepticus ([place]) -     CBC with Differential/Platelet -     CMP14+EGFR -     TSH -     Levetiracetam level -     Lamotrigine level  Other orders -     Discontinue: clonazePAM (KLONOPIN) 0.5 MG tablet; TAke one each morning and as needed for seizure. Take one at bedtime. Repeat one hour later if needed for sleep. -     clonazePAM (KLONOPIN) 0.5 MG tablet; TAke one each morning and as needed for seizure. Take one at bedtime. Repeat one hour later if needed for sleep.   I am having [name]  maintain her lamoTRIgine, citalopram, levETIRAcetam, and clonazePAM.  Meds ordered this encounter  Medications  . DISCONTD: clonazePAM (KLONOPIN) 0.5 MG tablet    Sig: TAke one each morning and as needed for seizure. Take one at bedtime. Repeat one hour later if needed for sleep.    Dispense:  90 tablet    Refill:  5  . clonazePAM (KLONOPIN) 0.5 MG tablet    Sig: TAke one each morning and as needed for seizure. Take one at bedtime. Repeat one hour later if needed for sleep.    Dispense:  90 tablet    Refill:  5     Follow-up: Return in about 3 months (around 05/01/2015).  [name], M.D.

## 2015-01-31 NOTE — Telephone Encounter (Signed)
Last seen 12/15/14 Dr Livia Snellen  If approved route to nurse to call into [facility]

## 2015-02-01 ENCOUNTER — Encounter: Payer: Self-pay | Admitting: Adult Health

## 2015-02-01 ENCOUNTER — Ambulatory Visit (INDEPENDENT_AMBULATORY_CARE_PROVIDER_SITE_OTHER): Payer: PPO | Admitting: Adult Health

## 2015-02-01 VITALS — BP 112/83 | HR 98 | Ht 61.0 in | Wt 173.0 lb

## 2015-02-01 DIAGNOSIS — R569 Unspecified convulsions: Secondary | ICD-10-CM | POA: Diagnosis not present

## 2015-02-01 NOTE — Patient Instructions (Signed)
Continue Keppra and Lamictal  Don't miss any doses Please set up an appointment psychiatrist [facility] If you have any seizure events please let us know

## 2015-02-01 NOTE — Progress Notes (Signed)
PATIENT: [name] DOB: [birth year]  REASON FOR VISIT: follow up- seizure HISTORY FROM: patient  HISTORY OF PRESENT ILLNESS:  [name] is a 50 female with a history of seizures. She returns today for an evaluation. The patient had several seizures and was eventually taken to the emergency room. The patient did improve once at the emergency room. She did not have to be intubated. Her Keppra was increased to 1000 mg twice a day. The patient continues on Lamictal. Since then the patient has not had any additional seizures. The patient's sister-in-law brought to my attention in private that she feels that the patient is taking pain medicine inappropriately. She is wondering if this was the cause of some of her symptoms/seizures when she went to the emergency room. She states that her physicians continue to give her pain medicine for her knee pain even after they have called and requested that she not be given this. Patient reports that she is able to complete  ADLs independently.   Patient does report ongoing depression. In the past she has been asked to follow-up with mental health however the patient has failed to do this. She is currently on Celexa.She denies any new neurological symptoms. She returns today for an evaluation.  HISTORY 08/21/14 [facility]  [name] is here today for follow-up on her subjective memory complaints. A [place] cognitive assessment test was performed today in which she failed on the Trail Making Test and was unable to copy a cube. The contour of her clock was also father droopy but she placed the numbers and hands correctly. She was able to name objects she did very well in all parts of attention testing including serial sevens and repeated the complicated sentences her word fluency however was limited she only generated 8 words she was good as obstruction and fully oriented to place time and date her delayed recall testing showed 4 out of 5 delayed words to be  correctly remembered this is a very good result she scored 26 out of 30 points. Patient is high school educated and this would be an normal score at 26-30.  We discussed anecdotal evidence for her spells- her husband reported that a couple of times they have been to the grocery store and she needed to sit down for moment she seems to be mostly claustrophobic when surrounded by a lot of people. This may be more of an anxiety attack. Reports some insomnia, mostly related to being worried about her meanwhile 37 year old son. When she hasn't had a good night sleep she is more likely to be hypersensitive to claustrophobia or to other spells of anxiety or panic. No other "seizures " on the current medication.  REVIEW OF SYSTEMS: Out of a complete 14 system review of symptoms, the patient complains only of the following symptoms, and all other reviewed systems are negative.   Appetite change, fatigue, blurred vision, leg swelling, excessive thirst, constipation, restless leg, insomnia , joint pain, aching muscles, walking difficulty, itching, behavior problem , depression, nervous/anxious, suicidal thoughts seizure, headache   ALLERGIES: Allergies  Allergen Reactions  . Acetaminophen Other (See Comments)    Has fatty deposits on liver  . Benadryl [Diphenhydramine Hcl] Other (See Comments)    hyperactivity and seizures  . Codeine Itching and Rash    seizures  . Dilantin [Phenytoin Sodium Extended] Other (See Comments)    Elevated LFT's  . Melatonin Other (See Comments)    seizures  . Ultram [Tramadol Hcl] Other (See  Comments)    Seizures  . Vimpat [Lacosamide] Other (See Comments)    Severe dizziness  . Betadine [Povidone Iodine] Rash  . Latex Rash  . Penicillins Itching and Rash    Has patient had a PCN reaction causing immediate rash, facial/tongue/throat swelling, SOB or lightheadedness with hypotension: No Has patient had a PCN reaction causing severe rash involving mucus membranes or skin  necrosis: No Has patient had a PCN reaction that required hospitalization No Has patient had a PCN reaction occurring within the last 10 years: No If all of the above answers are "NO", then may proceed with Cephalosporin use.  . Sulfa Antibiotics Rash  . Tape Rash    Paper tape please  . Vicodin [Hydrocodone-Acetaminophen] Itching    HOME MEDICATIONS: Outpatient Prescriptions Prior to Visit  Medication Sig Dispense Refill  . citalopram (CELEXA) 20 MG tablet TAKE ONE TABLET BY MOUTH ONCE DAILY 30 tablet 1  . clonazePAM (KLONOPIN) 0.5 MG tablet TAke one each morning and as needed for seizure. Take one at bedtime. Repeat one hour later if needed for sleep. 90 tablet 5  . lamoTRIgine (LAMICTAL) 100 MG tablet Take 1 tablet (100 mg total) by mouth 2 (two) times daily. Keep 12 hours apart. 180 tablet 3  . levETIRAcetam (KEPPRA) 500 MG tablet Take 2 tablets (1,000 mg total) by mouth 2 (two) times daily. 120 tablet 0   No facility-administered medications prior to visit.    PAST MEDICAL HISTORY: Past Medical History  Diagnosis Date  . Vertigo   . Constipation   . Restless leg   . High cholesterol   . Chronic bronchitis ([place])     "yearly; when the weather changes" (04/22/2012)  . [id number])     "1/wk" (04/22/2012)  . Depression   . Epilepsy ([place])     "been having them right often here lately" (04/22/2012)  . Seizures ([place])   . Arthritis     "knees" (04/22/2012)  . Osteoarthritis     [name] 04/22/2012  . Colon polyps     adenomatous and hyperplastic-  . Rectal bleed     in toilet- bright red  . Other convulsions 05/21/12    non-epileptic spells    PAST SURGICAL HISTORY: Past Surgical History  Procedure Laterality Date  . Total hip arthroplasty Left 1993; 1995; 2000  . Abdominal hysterectomy  2001  . Colonoscopy    . Joint replacement    . Mass excision  10/22/2011    Procedure: EXCISION MASS;  Surgeon: [name], MD;  Location: [facility];  Service: General;  Laterality:  Right;  excision right buttock mass  . Bladder suspension    . Cesarean section  1987; 1988  . Bunionectomy Left 2000    FAMILY HISTORY: Family History  Problem Relation Age of Onset  . Cancer Mother   . Cancer Brother   . Diabetes Brother   . Cancer Maternal Aunt   . Diabetes Maternal Aunt     SOCIAL HISTORY: Social History   Social History  . Marital Status: Married    Spouse Name: [name]  . Number of Children: 2  . Years of Education: 12 +   Occupational History  . unemployed Other   Social History Main Topics  . Smoking status: Never Smoker   . Smokeless tobacco: Never Used  . Alcohol Use: No  . Drug Use: No  . Sexual Activity: Not Currently    Birth Control/ Protection: Post-menopausal   Other Topics Concern  . Not  on file   Social History Narrative   Patient is married [name]) and lives at home with her husband.   Patient has two adult children.   Patient is disabled.   Patient has a high school education and some trade school.   Patient is right-handed.   Patient drinks four glasses of soda and tea daily.      PHYSICAL EXAM  Filed Vitals:   02/01/15 1122  BP: 112/83  Pulse: 98  Height: 5\' 1"  (1.549 m)  Weight: 173 lb (78.472 kg)   Body mass index is 32.7 kg/(m^2).  Generalized: Well developed, in no acute distress   Neurological examination  Mentation: Alert oriented to time, place, history taking. Follows all commands speech and language fluent Cranial nerve II-XII: Pupils were equal round reactive to light. Extraocular movements were full, visual field were full on confrontational test. Facial sensation and strength were normal. Uvula tongue midline. Head turning and shoulder shrug  were normal and symmetric. Motor: The motor testing reveals 5 over 5 strength of all 4 extremities. Good symmetric motor tone is noted throughout.  Sensory: Sensory testing is intact to soft touch on all 4 extremities. No evidence of extinction is noted.    Coordination: Cerebellar testing reveals good finger-nose-finger and heel-to-shin bilaterally.  Gait and station:  Patient has a limp on the right. Reflexes: Deep tendon reflexes are symmetric and normal bilaterally.   DIAGNOSTIC DATA (LABS, IMAGING, TESTING) - I reviewed patient records, labs, notes, testing and imaging myself where available.  Lab Results  Component Value Date   WBC 6.8 01/31/2015   HGB 14.0 01/24/2015   HCT 38.9 01/31/2015   MCV 91 01/31/2015   PLT 307 01/31/2015      Component Value Date/Time   NA 144 01/31/2015 1445   NA 141 01/24/2015 0540   K 4.6 01/31/2015 1445   CL 105 01/31/2015 1445   CO2 22 01/31/2015 1445   GLUCOSE 114* 01/31/2015 1445   GLUCOSE 93 01/24/2015 0540   BUN 10 01/31/2015 1445   BUN 12 01/24/2015 0540   CREATININE 0.62 01/31/2015 1445   CALCIUM 9.9 01/31/2015 1445   PROT 6.6 01/31/2015 1445   PROT 6.8 01/24/2015 0540   ALBUMIN 4.5 01/31/2015 1445   ALBUMIN 4.2 01/24/2015 0540   AST 36 01/31/2015 1445   ALT 63* 01/31/2015 1445   ALKPHOS 63 01/31/2015 1445   BILITOT 0.2 01/31/2015 1445   BILITOT 1.3* 01/24/2015 0540   GFRNONAA 106 01/31/2015 1445   GFRAA 122 01/31/2015 1445    Lab Results  Component Value Date   HGBA1C 5.4 09/08/2013    Lab Results  Component Value Date   TSH 0.661 01/31/2015      ASSESSMENT AND PLAN 50 y.o. year old female  has a past medical history of Vertigo; Constipation; Restless leg; High cholesterol; Chronic bronchitis ([place]); Headache(784.0); Depression; Epilepsy ([place]); Seizures ([place]); Arthritis; Osteoarthritis; Colon polyps; Rectal bleed; and Other convulsions (05/21/12). here with:   1. Seizures  The patient will continue on Keppra 1000mg  twice a day as well as Lamictal. She recently had blood work with her primary care however those have not resulted. The patient is advised that she should follow-up with mental health in regards to her ongoing depression. She should let us know if she has any  additional seizure events. She will follow-up in 2-3 months with Dr. Mechele Claude, MSN, NP-C 02/01/2015, 1:14 PM [facility] [address] [place], [place] [address] [phone]    

## 2015-02-01 NOTE — Progress Notes (Signed)
I agree with the assessment and plan as directed by NP .The patient is known to me .   Karys Meckley, MD  

## 2015-02-02 LAB — CMP14+EGFR
ALT: 63 IU/L — ABNORMAL HIGH (ref 0–32)
AST: 36 IU/L (ref 0–40)
Albumin/Globulin Ratio: 2.1 (ref 1.1–2.5)
Albumin: 4.5 g/dL (ref 3.5–5.5)
Alkaline Phosphatase: 63 IU/L (ref 39–117)
BUN/Creatinine Ratio: 16 (ref 9–23)
BUN: 10 mg/dL (ref 6–24)
Bilirubin Total: 0.2 mg/dL (ref 0.0–1.2)
CO2: 22 mmol/L (ref 18–29)
Calcium: 9.9 mg/dL (ref 8.7–10.2)
Chloride: 105 mmol/L (ref 96–106)
Creatinine, Ser: 0.62 mg/dL (ref 0.57–1.00)
GFR calc Af Amer: 122 mL/min/{1.73_m2} (ref >59)
GFR calc non Af Amer: 106 mL/min/{1.73_m2} (ref >59)
Globulin, Total: 2.1 g/dL (ref 1.5–4.5)
Glucose: 114 mg/dL — ABNORMAL HIGH (ref 65–99)
Potassium: 4.6 mmol/L (ref 3.5–5.2)
Sodium: 144 mmol/L (ref 134–144)
Total Protein: 6.6 g/dL (ref 6.0–8.5)

## 2015-02-02 LAB — CBC WITH DIFFERENTIAL/PLATELET
Basophils Absolute: 0 10*3/uL (ref 0.0–0.2)
Basos: 0 %
EOS (ABSOLUTE): 0 10*3/uL (ref 0.0–0.4)
Eos: 0 %
Hematocrit: 38.9 % (ref 34.0–46.6)
Hemoglobin: 12.8 g/dL (ref 11.1–15.9)
Immature Grans (Abs): 0 10*3/uL (ref 0.0–0.1)
Immature Granulocytes: 0 %
Lymphocytes Absolute: 2.5 10*3/uL (ref 0.7–3.1)
Lymphs: 37 %
MCH: 29.9 pg (ref 26.6–33.0)
MCHC: 32.9 g/dL (ref 31.5–35.7)
MCV: 91 fL (ref 79–97)
Monocytes Absolute: 0.6 10*3/uL (ref 0.1–0.9)
Monocytes: 9 %
Neutrophils Absolute: 3.6 10*3/uL (ref 1.4–7.0)
Neutrophils: 54 %
Platelets: 307 10*3/uL (ref 150–379)
RBC: 4.28 x10E6/uL (ref 3.77–5.28)
RDW: 13.8 % (ref 12.3–15.4)
WBC: 6.8 10*3/uL (ref 3.4–10.8)

## 2015-02-02 LAB — TSH: TSH: 0.661 u[IU]/mL (ref 0.450–4.500)

## 2015-02-02 LAB — LAMOTRIGINE LEVEL: Lamotrigine Lvl: 5.2 ug/mL (ref 2.0–20.0)

## 2015-02-02 LAB — LEVETIRACETAM LEVEL: Levetiracetam Lvl: 18.6 ug/mL (ref 10.0–40.0)

## 2015-02-08 ENCOUNTER — Telehealth: Payer: Self-pay | Admitting: Adult Health

## 2015-02-08 DIAGNOSIS — M170 Bilateral primary osteoarthritis of knee: Secondary | ICD-10-CM | POA: Diagnosis not present

## 2015-02-08 NOTE — Telephone Encounter (Signed)
Pt called said she was taken off neurontin 01/23/15 while in the hospital. Since that time the bottom of her feet are numb for about 2 weeks. She thinks the neurontin was helping her feet and would like to take it again. She said the Keppra was increased to 2000mg  /day. She was told the neurontin was d/c bc the type seizures she has it could make them worse. She has been taking it for about 3 yrs. Please call

## 2015-02-08 NOTE — Telephone Encounter (Signed)
I called the patient. The line was busy. I was unable to leave a message. I will call back tomorrow.

## 2015-02-12 NOTE — Telephone Encounter (Signed)
I called the patient. I advised that we would not restart gabapentin at this time. Patient is amenable to this plan. She states that she try to get a visit with psychiatry however her co-pay was $250. I have instructed the patient to rcall her insurance to see if they cover any psychiatry visits.

## 2015-02-12 NOTE — Telephone Encounter (Signed)
Pt returned Megan's call. °

## 2015-02-15 ENCOUNTER — Telehealth: Payer: Self-pay | Admitting: Adult Health

## 2015-02-15 NOTE — Telephone Encounter (Signed)
Patient called, states the Psychiatrist that [name] wanted her to see [name] is not listed on her insurance but Dr. Brett Fairy is. Please call to advise.

## 2015-02-15 NOTE — Telephone Encounter (Signed)
Advised patient, Dr. Brett Fairy is not a psychiatrist. Provided some local psychiatrists info. Advised not sure if they accept her insurance. Advised pt if they do not, call insurance back and get an updated referral list for psychiatry. Patient verbalized understanding.

## 2015-02-21 ENCOUNTER — Ambulatory Visit: Payer: Medicare Other | Admitting: Adult Health

## 2015-02-22 ENCOUNTER — Other Ambulatory Visit: Payer: Self-pay | Admitting: Family Medicine

## 2015-02-27 DIAGNOSIS — M24872 Other specific joint derangements of left ankle, not elsewhere classified: Secondary | ICD-10-CM | POA: Diagnosis not present

## 2015-02-28 ENCOUNTER — Telehealth: Payer: Self-pay | Admitting: Adult Health

## 2015-02-28 ENCOUNTER — Other Ambulatory Visit: Payer: Self-pay

## 2015-02-28 MED ORDER — LEVETIRACETAM 500 MG PO TABS: 1000.0000 mg | ORAL_TABLET | Freq: Two times a day (BID) | ORAL | Status: DC

## 2015-02-28 NOTE — Telephone Encounter (Signed)
This encounter was already closed when it was forwarded.  Rx has been sent in separate refill encounter.

## 2015-02-28 NOTE — Telephone Encounter (Signed)
Pt's mother called and is requesting a refill on levETIRAcetam (KEPPRA) 500 MG tablet. Thank you

## 2015-03-27 DIAGNOSIS — M170 Bilateral primary osteoarthritis of knee: Secondary | ICD-10-CM | POA: Diagnosis not present

## 2015-03-27 DIAGNOSIS — M1711 Unilateral primary osteoarthritis, right knee: Secondary | ICD-10-CM | POA: Diagnosis not present

## 2015-04-05 ENCOUNTER — Other Ambulatory Visit: Payer: Self-pay | Admitting: Family Medicine

## 2015-04-09 ENCOUNTER — Ambulatory Visit (INDEPENDENT_AMBULATORY_CARE_PROVIDER_SITE_OTHER): Payer: PPO | Admitting: Pediatrics

## 2015-04-09 ENCOUNTER — Other Ambulatory Visit: Payer: Self-pay

## 2015-04-09 ENCOUNTER — Encounter: Payer: Self-pay | Admitting: Pediatrics

## 2015-04-09 VITALS — BP 117/89 | HR 97 | Temp 97.4°F | Ht 61.0 in | Wt 176.2 lb

## 2015-04-09 DIAGNOSIS — Z0000 Encounter for general adult medical examination without abnormal findings: Secondary | ICD-10-CM | POA: Diagnosis not present

## 2015-04-09 DIAGNOSIS — R109 Unspecified abdominal pain: Secondary | ICD-10-CM

## 2015-04-09 DIAGNOSIS — F321 Major depressive disorder, single episode, moderate: Secondary | ICD-10-CM

## 2015-04-09 DIAGNOSIS — K760 Fatty (change of) liver, not elsewhere classified: Secondary | ICD-10-CM

## 2015-04-09 DIAGNOSIS — Z1239 Encounter for other screening for malignant neoplasm of breast: Secondary | ICD-10-CM

## 2015-04-09 DIAGNOSIS — R739 Hyperglycemia, unspecified: Secondary | ICD-10-CM

## 2015-04-09 DIAGNOSIS — Z6833 Body mass index (BMI) 33.0-33.9, adult: Secondary | ICD-10-CM | POA: Diagnosis not present

## 2015-04-09 DIAGNOSIS — G40209 Localization-related (focal) (partial) symptomatic epilepsy and epileptic syndromes with complex partial seizures, not intractable, without status epilepticus: Secondary | ICD-10-CM

## 2015-04-09 LAB — URINALYSIS
Bilirubin, UA: NEGATIVE
Glucose, UA: NEGATIVE
Ketones, UA: NEGATIVE
Leukocytes, UA: NEGATIVE
Nitrite, UA: NEGATIVE
Protein, UA: NEGATIVE
RBC, UA: NEGATIVE
Specific Gravity, UA: 1.02 (ref 1.005–1.030)
Urobilinogen, Ur: 0.2 mg/dL (ref 0.2–1.0)
pH, UA: 7 (ref 5.0–7.5)

## 2015-04-09 LAB — URINALYSIS, MICROSCOPIC ONLY
Epithelial Cells (non renal): >10 /hpf — AB (ref 0–10)
RBC, UA: NONE SEEN /hpf (ref 0–?)

## 2015-04-09 LAB — BAYER DCA HB A1C WAIVED: HB A1C (BAYER DCA - WAIVED): 5.6 % (ref <7.0)

## 2015-04-09 MED ORDER — CITALOPRAM HYDROBROMIDE 40 MG PO TABS: 40.0000 mg | ORAL_TABLET | Freq: Every day | ORAL | Status: DC

## 2015-04-09 MED ORDER — LEVETIRACETAM 500 MG PO TABS: 1000.0000 mg | ORAL_TABLET | Freq: Two times a day (BID) | ORAL | Status: DC

## 2015-04-09 NOTE — Progress Notes (Signed)
Subjective:    Patient ID: [name], female    DOB: [birth year], 50 y.o.   MRN: [id number]  CC: Annual Exam   HPI: [name] is a 50 y.o. 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 He is with her today in visit. Son in jail.   Epilepsy: started at 35yo. Recent hospitalization and med adjustment   Likely fatty liver disease: found on u/s in 2013. Hep virus panel negative at the time. H/o mildly elevated LFTs.   Depression screen [facility] 2/9 04/09/2015 12/15/2014 09/19/2014 08/21/2014 07/12/2014  Decreased Interest 0 0 1 1 0  Down, Depressed, Hopeless 0 0 2 1 0  PHQ - 2 Score 0 0 3 2 0  Altered sleeping - - 3 3 -  Tired, decreased energy - - 3 2 -  Change in appetite - - 2 1 -  Feeling bad or failure about yourself  - - 2 0 -  Trouble concentrating - - 3 1 -  Moving slowly or fidgety/restless - - 2 1 -  Suicidal thoughts - - 0 0 -  PHQ-9 Score - - 18 10 -  Difficult doing work/chores - - - Somewhat difficult -     Relevant past medical, surgical, family and social history reviewed and updated as indicated. Interim medical history since our last visit reviewed. Allergies and medications reviewed and updated.  ROS: All systems negative other than what is in HPI  History  Smoking status  . Never Smoker   Smokeless tobacco  . Never Used    Past Medical History Patient Active Problem List   Diagnosis Date Noted  . Status epilepticus ([place]) 01/24/2015  . Altered mental status 01/23/2015  . Acute encephalopathy 01/23/2015  . Seizures ([place]) 01/23/2015  . Epilepsy without status epilepticus, not intractable ([place]) 02/20/2014  .  Memory loss 02/20/2014  . 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 Moderate major depression ([place]) 10/14/2010    Current Outpatient Prescriptions  Medication Sig Dispense Refill  . citalopram (CELEXA) 40 MG tablet Take 1 tablet (40 mg total) by mouth daily. 30 tablet 3  . clonazePAM (KLONOPIN) 0.5 MG tablet TAke one each morning and as needed for seizure. Take one at bedtime. Repeat one hour later if needed for sleep. 90 tablet 5  . lamoTRIgine (LAMICTAL) 100 MG tablet Take 1 tablet (100 mg total) by mouth 2 (two) times daily. Keep 12 hours apart. 180 tablet 3  . levETIRAcetam (KEPPRA) 500 MG tablet Take 2 tablets (1,000 mg total) by mouth 2 (two) times daily. 120 tablet 2  . pramipexole (MIRAPEX) 0.5 MG tablet TAKE ONE TABLET BY MOUTH TWICE DAILY 56 tablet 0   No current facility-administered medications for this visit.       Objective:    BP 117/89 mmHg  Pulse 97  Temp(Src) 97.4 F (36.3 C) (Oral)  Ht '5\' 1"'  (1.549 m)  Wt 176 lb 3.2 oz (79.924 kg)  BMI 33.31 kg/m2  Wt Readings from Last 3 Encounters:  04/09/15 176 lb 3.2 oz (79.924 kg)  02/01/15 173 lb (78.472 kg)  01/31/15 173 lb 6.4 oz (78.654 kg)  Gen: NAD, alert, cooperative with exam, NCAT EYES: EOMI, no scleral injection or icterus ENT:  TMs dull gray, L TM with some scarring present, OP without erythema LYMPH: no cervical LAD CV: NRRR, normal S1/S2, no murmur, distal pulses 2+ b/l Resp: CTABL, no wheezes, normal WOB Abd: +BS, soft, NTND. no guarding or organomegaly Ext: No edema, warm Neuro: Alert and oriented, strength equal b/l UE and LE, coordination grossly normal MSK: normal muscle bulk Breast: normal exam     Assessment & Plan:    [name] was seen today for annual exam.  Diagnoses and all orders for this visit:  Encounter for preventive health examination -     CMP14+EGFR -     CBC with Differential/Platelet -     Urine Microscopic -     Urinalysis -     Lipid panel  Moderate major depression  ([place]) Continued symptoms.  Lots of stress at home. -     citalopram (CELEXA) 40 MG tablet; Take 1 tablet (40 mg total) by mouth daily.  Partial symptomatic epilepsy with complex partial seizures, not intractable, without status epilepticus ([place]) Followed by neurology No further seizures since increase in medicines with recent hospitalization 2 months ago.  BMI 33.0-33.9,adult Discussed lifestyle changes, decrease sweet tea -     Lipid panel  Fatty liver disease, nonalcoholic Discussed lifestyle changes -     CMP14+EGFR  Left flank pain H/o renal stones, pain not severe Will get UA.  Screening for breast cancer -     MM Digital Screening; Future  Hyperglycemia -     Bayer DCA Hb A1c Waived  Osteoarthritis Upcoming surgery for L knee replacement Discussed surgical clearance/risk of anesthesia as below with pt and her husband.  Recent EKG from Jan 2017 No underlying known kidney, lung, heart problems. Not on insulin.  With no risk factors, per RCRI criteria Rate of cardiac death, nonfatal MI, and nonfatal cardiac arrest according to predictors No risk factors- 0.4 %  Rate of MI, pulmonary edema, ventricular fibrillation, primary cardiac arrest and Complete heart block No risk factors- 0.5%  Follow up plan: Return in about 4 months (around 08/09/2015).  Assunta Found, MD [facility] 04/09/2015, 11:56 AM

## 2015-04-10 LAB — LIPID PANEL
Chol/HDL Ratio: 4.3 ratio units (ref 0.0–4.4)
Cholesterol, Total: 217 mg/dL — ABNORMAL HIGH (ref 100–199)
HDL: 50 mg/dL (ref >39)
LDL Calculated: 136 mg/dL — ABNORMAL HIGH (ref 0–99)
Triglycerides: 156 mg/dL — ABNORMAL HIGH (ref 0–149)
VLDL Cholesterol Cal: 31 mg/dL (ref 5–40)

## 2015-04-10 LAB — CBC WITH DIFFERENTIAL/PLATELET
Basophils Absolute: 0 10*3/uL (ref 0.0–0.2)
Basos: 0 %
EOS (ABSOLUTE): 0 10*3/uL (ref 0.0–0.4)
Eos: 0 %
Hematocrit: 40.9 % (ref 34.0–46.6)
Hemoglobin: 13.3 g/dL (ref 11.1–15.9)
Immature Grans (Abs): 0 10*3/uL (ref 0.0–0.1)
Immature Granulocytes: 0 %
Lymphocytes Absolute: 2.9 10*3/uL (ref 0.7–3.1)
Lymphs: 35 %
MCH: 30.2 pg (ref 26.6–33.0)
MCHC: 32.5 g/dL (ref 31.5–35.7)
MCV: 93 fL (ref 79–97)
Monocytes Absolute: 0.7 10*3/uL (ref 0.1–0.9)
Monocytes: 8 %
Neutrophils Absolute: 4.7 10*3/uL (ref 1.4–7.0)
Neutrophils: 57 %
Platelets: 306 10*3/uL (ref 150–379)
RBC: 4.41 x10E6/uL (ref 3.77–5.28)
RDW: 13.5 % (ref 12.3–15.4)
WBC: 8.3 10*3/uL (ref 3.4–10.8)

## 2015-04-10 LAB — CMP14+EGFR
ALT: 40 IU/L — ABNORMAL HIGH (ref 0–32)
AST: 32 IU/L (ref 0–40)
Albumin/Globulin Ratio: 2.4 — ABNORMAL HIGH (ref 1.2–2.2)
Albumin: 4.5 g/dL (ref 3.5–5.5)
Alkaline Phosphatase: 66 IU/L (ref 39–117)
BUN/Creatinine Ratio: 26 — ABNORMAL HIGH (ref 9–23)
BUN: 15 mg/dL (ref 6–24)
Bilirubin Total: 0.3 mg/dL (ref 0.0–1.2)
CO2: 23 mmol/L (ref 18–29)
Calcium: 9.2 mg/dL (ref 8.7–10.2)
Chloride: 106 mmol/L (ref 96–106)
Creatinine, Ser: 0.58 mg/dL (ref 0.57–1.00)
GFR calc Af Amer: 125 mL/min/{1.73_m2} (ref >59)
GFR calc non Af Amer: 109 mL/min/{1.73_m2} (ref >59)
Globulin, Total: 1.9 g/dL (ref 1.5–4.5)
Glucose: 97 mg/dL (ref 65–99)
Potassium: 4.4 mmol/L (ref 3.5–5.2)
Sodium: 144 mmol/L (ref 134–144)
Total Protein: 6.4 g/dL (ref 6.0–8.5)

## 2015-04-22 ENCOUNTER — Ambulatory Visit: Payer: Self-pay | Admitting: Orthopedic Surgery

## 2015-04-22 NOTE — Progress Notes (Signed)
Preoperative surgical orders have been place into the Epic hospital system for [name] on 04/22/2015, 7:41 PM  by Mickel Crow for surgery on 05/07/15.  Preop Total Knee orders including Experal, IV Tylenol, and IV Decadron as long as there are no contraindications to the above medications. Arlee Muslim, PA-C

## 2015-04-23 ENCOUNTER — Encounter: Payer: Self-pay | Admitting: Adult Health

## 2015-04-23 ENCOUNTER — Ambulatory Visit (INDEPENDENT_AMBULATORY_CARE_PROVIDER_SITE_OTHER): Payer: PPO | Admitting: Adult Health

## 2015-04-23 VITALS — BP 113/67 | HR 67 | Ht 61.0 in | Wt 178.0 lb

## 2015-04-23 DIAGNOSIS — R569 Unspecified convulsions: Secondary | ICD-10-CM | POA: Diagnosis not present

## 2015-04-23 DIAGNOSIS — Z5181 Encounter for therapeutic drug level monitoring: Secondary | ICD-10-CM

## 2015-04-23 MED ORDER — LEVETIRACETAM 500 MG PO TABS: ORAL_TABLET | ORAL | Status: DC

## 2015-04-23 NOTE — Patient Instructions (Signed)
Increase Keppra to 2 tablets in the morning and 3 tablets in the evening No driving until seizure free for 6 months If your symptoms worsen or you develop new symptoms please let us know.

## 2015-04-23 NOTE — Progress Notes (Signed)
I agree with the assessment and plan as directed by NP .The patient is known to me .   Bertha Earwood, MD  

## 2015-04-23 NOTE — Progress Notes (Signed)
PATIENT: [name] DOB: 1965/04/20  REASON FOR VISIT: follow up- seizures HISTORY FROM: patient  HISTORY OF PRESENT ILLNESS: [name] is a 50 year old female with a history of seizures. She returns today for follow-up. She reports that she had been seizure-free since the last visit until last week. She states that she was outside and tripped over the garden hose and hit her head. She states that she went inside to lay down and her husband witnessed her having a seizure on the couch. She states that he told her that she was shaking in the arms and the legs. She denies biting her tongue. Denies loss of bowels or bladder. She did not go to the emergency room. She states that she's been taking Keppra and Lamictal consistently. Denies missing any medication. She states that after hitting her head she suffered a headache for 2 days however that has resolved. The patient does not operate a motor vehicle. At home she is able to complete all ADLs independent. She is no longer on narcotic medication. She reports that she will be having a knee replacement in 2 weeks.  HISTORY 02/01/2015 (MM): [name] is a 80 female with a history of seizures. She returns today for an evaluation. The patient had several seizures and was eventually taken to the emergency room. The patient did improve once at the emergency room. She did not have to be intubated. Her Keppra was increased to 1000 mg twice a day. The patient continues on Lamictal. Since then the patient has not had any additional seizures. The patient's sister-in-law brought to my attention in private that she feels that the patient is taking pain medicine inappropriately. She is wondering if this was the cause of some of her symptoms/seizures when she went to the emergency room. She states that her physicians continue to give her pain medicine for her knee pain even after they have called and requested that she not be given this. Patient reports that she  is able to complete ADLs independently. Patient does report ongoing depression. In the past she has been asked to follow-up with mental health however the patient has failed to do this. She is currently on Celexa.She denies any new neurological symptoms. She returns today for an evaluation.  HISTORY 08/21/14 [facility]):  [name] is here today for follow-up on her subjective memory complaints. A [place] cognitive assessment test was performed today in which she failed on the Trail Making Test and was unable to copy a cube. The contour of her clock was also father droopy but she placed the numbers and hands correctly. She was able to name objects she did very well in all parts of attention testing including serial sevens and repeated the complicated sentences her word fluency however was limited she only generated 8 words she was good as obstruction and fully oriented to place time and date her delayed recall testing showed 4 out of 5 delayed words to be correctly remembered this is a very good result she scored 26 out of 30 points. Patient is high school educated and this would be an normal score at 26-30.  We discussed anecdotal evidence for her spells- her husband reported that a couple of times they have been to the grocery store and she needed to sit down for moment she seems to be mostly claustrophobic when surrounded by a lot of people. This may be more of an anxiety attack. Reports some insomnia, mostly related to being worried about her meanwhile  40 year old son. When she hasn't had a good night sleep she is more likely to be hypersensitive to claustrophobia or to other spells of anxiety or panic. No other "seizures " on the current medication.  REVIEW OF SYSTEMS: Out of a complete 14 system review of symptoms, the patient complains only of the following symptoms, and all other reviewed systems are negative.  Constipation, leg swelling, restless leg, joint pain, aching muscles, walking  difficulty, headache, seizures, agitation, depression  ALLERGIES: Allergies  Allergen Reactions  . Acetaminophen Other (See Comments)    Has fatty deposits on liver  . Benadryl [Diphenhydramine Hcl] Other (See Comments)    hyperactivity and seizures  . Codeine Itching and Rash    seizures  . Dilantin [Phenytoin Sodium Extended] Other (See Comments)    Elevated LFT's  . Melatonin Other (See Comments)    seizures  . Ultram [Tramadol Hcl] Other (See Comments)    Seizures  . Vimpat [Lacosamide] Other (See Comments)    Severe dizziness  . Betadine [Povidone Iodine] Rash  . Latex Rash  . Penicillins Itching and Rash    Has patient had a PCN reaction causing immediate rash, facial/tongue/throat swelling, SOB or lightheadedness with hypotension: No Has patient had a PCN reaction causing severe rash involving mucus membranes or skin necrosis: No Has patient had a PCN reaction that required hospitalization No Has patient had a PCN reaction occurring within the last 10 years: No If all of the above answers are "NO", then may proceed with Cephalosporin use.  . Sulfa Antibiotics Rash  . Tape Rash    Paper tape please  . Vicodin [Hydrocodone-Acetaminophen] Itching    HOME MEDICATIONS: Outpatient Prescriptions Prior to Visit  Medication Sig Dispense Refill  . citalopram (CELEXA) 40 MG tablet Take 1 tablet (40 mg total) by mouth daily. 30 tablet 3  . clonazePAM (KLONOPIN) 0.5 MG tablet TAke one each morning and as needed for seizure. Take one at bedtime. Repeat one hour later if needed for sleep. 90 tablet 5  . gabapentin (NEURONTIN) 600 MG tablet Take 600 mg by mouth 3 (three) times daily.    Marland Kitchen lamoTRIgine (LAMICTAL) 100 MG tablet Take 1 tablet (100 mg total) by mouth 2 (two) times daily. Keep 12 hours apart. 180 tablet 3  . levETIRAcetam (KEPPRA) 500 MG tablet Take 2 tablets (1,000 mg total) by mouth 2 (two) times daily. 120 tablet 2  . Multiple Vitamin (MULTIVITAMIN WITH MINERALS) TABS  tablet Take 1 tablet by mouth daily.    . pramipexole (MIRAPEX) 0.5 MG tablet TAKE ONE TABLET BY MOUTH TWICE DAILY 56 tablet 0   No facility-administered medications prior to visit.    PAST MEDICAL HISTORY: Past Medical History  Diagnosis Date  . Vertigo   . Constipation   . Restless leg   . High cholesterol   . Chronic bronchitis ([place])     "yearly; when the weather changes" (04/22/2012)  . [id number])     "1/wk" (04/22/2012)  . Depression   . Epilepsy ([place])     "been having them right often here lately" (04/22/2012)  . Seizures ([place])   . Arthritis     "knees" (04/22/2012)  . Osteoarthritis     [name] 04/22/2012  . Colon polyps     adenomatous and hyperplastic-  . Rectal bleed     in toilet- bright red  . Other convulsions 05/21/12    non-epileptic spells    PAST SURGICAL HISTORY: Past Surgical History  Procedure Laterality Date  .  Total hip arthroplasty Left 1993; 1995; 2000  . Abdominal hysterectomy  2001  . Colonoscopy    . Joint replacement    . Mass excision  10/22/2011    Procedure: EXCISION MASS;  Surgeon: [name], MD;  Location: [facility];  Service: General;  Laterality: Right;  excision right buttock mass  . Bladder suspension    . Cesarean section  1987; 1988  . Bunionectomy Left 2000    FAMILY HISTORY: Family History  Problem Relation Age of Onset  . Cancer Mother   . Cancer Brother   . Diabetes Brother   . Cancer Maternal Aunt   . Diabetes Maternal Aunt     SOCIAL HISTORY: Social History   Social History  . Marital Status: Married    Spouse Name: [name]  . Number of Children: 2  . Years of Education: 12 +   Occupational History  . unemployed Other   Social History Main Topics  . Smoking status: Never Smoker   . Smokeless tobacco: Never Used  . Alcohol Use: No  . Drug Use: No  . Sexual Activity: Not Currently    Birth Control/ Protection: Post-menopausal   Other Topics Concern  . Not on file   Social History Narrative    Patient is married [name]) and lives at home with her husband.   Patient has two adult children.   Patient is disabled.   Patient has a high school education and some trade school.   Patient is right-handed.   Patient drinks four glasses of soda and tea daily.      PHYSICAL EXAM  Filed Vitals:   04/23/15 1356  BP: 113/67  Pulse: 67  Height: 5\' 1"  (1.549 m)  Weight: 178 lb (80.74 kg)   Body mass index is 33.65 kg/(m^2).  Generalized: Well developed, in no acute distress   Neurological examination  Mentation: Alert oriented to time, place, history taking. Follows all commands language fluent. Patient's speech is slightly slurred. She states that this is because she does not have her dentures in family confirms that she has spoken like this for years.  Cranial nerve II-XII: Pupils were equal round reactive to light. Extraocular movements were full, visual field were full on confrontational test. Facial sensation and strength were normal. Uvula tongue midline. Head turning and shoulder shrug  were normal and symmetric. Motor: The motor testing reveals 5 over 5 strength of all 4 extremities. Good symmetric motor tone is noted throughout.  Sensory: Sensory testing is intact to soft touch on all 4 extremities. No evidence of extinction is noted.  Coordination: Cerebellar testing reveals good finger-nose-finger and heel-to-shin bilaterally.  Gait and station: Patient has a slight limp on the left due to the knee. Tandem gait is unsteady. Romberg is negative Reflexes: Deep tendon reflexes are symmetric and normal bilaterally.   DIAGNOSTIC DATA (LABS, IMAGING, TESTING) - I reviewed patient records, labs, notes, testing and imaging myself where available.  Lab Results  Component Value Date   WBC 8.3 04/09/2015   HGB 14.0 01/24/2015   HCT 40.9 04/09/2015   MCV 93 04/09/2015   PLT 306 04/09/2015      Component Value Date/Time   NA 144 04/09/2015 1101   NA 141 01/24/2015 0540   K  4.4 04/09/2015 1101   CL 106 04/09/2015 1101   CO2 23 04/09/2015 1101   GLUCOSE 97 04/09/2015 1101   GLUCOSE 93 01/24/2015 0540   BUN 15 04/09/2015 1101   BUN 12 01/24/2015 0540  CREATININE 0.58 04/09/2015 1101   CALCIUM 9.2 04/09/2015 1101   PROT 6.4 04/09/2015 1101   PROT 6.8 01/24/2015 0540   ALBUMIN 4.5 04/09/2015 1101   ALBUMIN 4.2 01/24/2015 0540   AST 32 04/09/2015 1101   ALT 40* 04/09/2015 1101   ALKPHOS 66 04/09/2015 1101   BILITOT 0.3 04/09/2015 1101   BILITOT 1.3* 01/24/2015 0540   GFRNONAA 109 04/09/2015 1101   GFRAA 125 04/09/2015 1101   Lab Results  Component Value Date   CHOL 217* 04/09/2015   HDL 50 04/09/2015   LDLCALC 136* 04/09/2015   TRIG 156* 04/09/2015   CHOLHDL 4.3 04/09/2015       ASSESSMENT AND PLAN 50 y.o. year old female  has a past medical history of Vertigo; Constipation; Restless leg; High cholesterol; Chronic bronchitis ([place]); Headache(784.0); Depression; Epilepsy ([place]); Seizures ([place]); Arthritis; Osteoarthritis; Colon polyps; Rectal bleed; and Other convulsions (05/21/12). here with:  1. Seizures  The patient had an additional seizure after hitting her head. I will increase Keppra 500 mg to 2 tablets in the morning and 3 tablets in the evening. She will continue on Lamictal 100 mg twice a day. I will check blood work today. Patient advised that if she has any additional seizure she should let us know. She should not drive until she is seizure-free for 6 months. She will return in 3-4 months with Dr. Mechele Claude, MSN, NP-C 04/23/2015, 2:11 PM [facility] [address], [place], [place] [address] [phone]

## 2015-04-24 ENCOUNTER — Telehealth: Payer: Self-pay

## 2015-04-24 DIAGNOSIS — Z0289 Encounter for other administrative examinations: Secondary | ICD-10-CM

## 2015-04-24 NOTE — Telephone Encounter (Signed)
Form completed and signed by Dr. Brett Fairy. Sent to MR for further action.

## 2015-04-25 ENCOUNTER — Telehealth: Payer: Self-pay | Admitting: Adult Health

## 2015-04-25 LAB — COMPREHENSIVE METABOLIC PANEL
ALT: 46 IU/L — ABNORMAL HIGH (ref 0–32)
AST: 38 IU/L (ref 0–40)
Albumin/Globulin Ratio: 2.3 — ABNORMAL HIGH (ref 1.2–2.2)
Albumin: 4.3 g/dL (ref 3.5–5.5)
Alkaline Phosphatase: 61 IU/L (ref 39–117)
BUN/Creatinine Ratio: 19 (ref 9–23)
BUN: 11 mg/dL (ref 6–24)
Bilirubin Total: 0.3 mg/dL (ref 0.0–1.2)
CO2: 22 mmol/L (ref 18–29)
Calcium: 9.1 mg/dL (ref 8.7–10.2)
Chloride: 103 mmol/L (ref 96–106)
Creatinine, Ser: 0.58 mg/dL (ref 0.57–1.00)
GFR calc Af Amer: 125 mL/min/{1.73_m2} (ref >59)
GFR calc non Af Amer: 109 mL/min/{1.73_m2} (ref >59)
Globulin, Total: 1.9 g/dL (ref 1.5–4.5)
Glucose: 83 mg/dL (ref 65–99)
Potassium: 4.3 mmol/L (ref 3.5–5.2)
Sodium: 143 mmol/L (ref 134–144)
Total Protein: 6.2 g/dL (ref 6.0–8.5)

## 2015-04-25 LAB — CBC WITH DIFFERENTIAL/PLATELET
Basophils Absolute: 0 10*3/uL (ref 0.0–0.2)
Basos: 0 %
EOS (ABSOLUTE): 0 10*3/uL (ref 0.0–0.4)
Eos: 0 %
Hematocrit: 37.5 % (ref 34.0–46.6)
Hemoglobin: 12.5 g/dL (ref 11.1–15.9)
Immature Grans (Abs): 0 10*3/uL (ref 0.0–0.1)
Immature Granulocytes: 0 %
Lymphocytes Absolute: 2.7 10*3/uL (ref 0.7–3.1)
Lymphs: 41 %
MCH: 30.1 pg (ref 26.6–33.0)
MCHC: 33.3 g/dL (ref 31.5–35.7)
MCV: 90 fL (ref 79–97)
Monocytes Absolute: 0.8 10*3/uL (ref 0.1–0.9)
Monocytes: 12 %
Neutrophils Absolute: 3 10*3/uL (ref 1.4–7.0)
Neutrophils: 47 %
Platelets: 278 10*3/uL (ref 150–379)
RBC: 4.15 x10E6/uL (ref 3.77–5.28)
RDW: 13.5 % (ref 12.3–15.4)
WBC: 6.5 10*3/uL (ref 3.4–10.8)

## 2015-04-25 LAB — LAMOTRIGINE LEVEL: Lamotrigine Lvl: 4.5 ug/mL (ref 2.0–20.0)

## 2015-04-25 NOTE — Progress Notes (Signed)
Quick Note:  Called and spoke to patient relayed lab work was ok. Patient understood. ______

## 2015-04-25 NOTE — Telephone Encounter (Signed)
-----   Message from [name], NP sent at 04/25/2015  7:32 AM EDT ----- Lab work ok. Please call patient.

## 2015-04-25 NOTE — Telephone Encounter (Signed)
Called and spoke to patient relayed lab work was ok . Patient understood details.

## 2015-05-01 ENCOUNTER — Encounter (INDEPENDENT_AMBULATORY_CARE_PROVIDER_SITE_OTHER): Payer: Self-pay

## 2015-05-01 ENCOUNTER — Ambulatory Visit (INDEPENDENT_AMBULATORY_CARE_PROVIDER_SITE_OTHER): Payer: PPO | Admitting: Family Medicine

## 2015-05-01 ENCOUNTER — Encounter (HOSPITAL_COMMUNITY): Admission: RE | Admit: 2015-05-01 | Payer: PPO | Source: Ambulatory Visit

## 2015-05-01 ENCOUNTER — Encounter: Payer: Self-pay | Admitting: Family Medicine

## 2015-05-01 VITALS — BP 90/61 | HR 85 | Temp 99.0°F | Ht 61.0 in | Wt 182.6 lb

## 2015-05-01 DIAGNOSIS — R600 Localized edema: Secondary | ICD-10-CM | POA: Diagnosis not present

## 2015-05-01 DIAGNOSIS — R6883 Chills (without fever): Secondary | ICD-10-CM | POA: Diagnosis not present

## 2015-05-01 DIAGNOSIS — R509 Fever, unspecified: Secondary | ICD-10-CM

## 2015-05-01 DIAGNOSIS — R52 Pain, unspecified: Secondary | ICD-10-CM

## 2015-05-01 LAB — VERITOR FLU A/B WAIVED
Influenza A: NEGATIVE
Influenza B: NEGATIVE

## 2015-05-01 NOTE — Patient Instructions (Signed)
Great tto see you!  Come back in 2-3 days  We will review labs then   Please call or come back sooner if anything worsens

## 2015-05-01 NOTE — Progress Notes (Signed)
   HPI  Patient presents today here with flulike symptoms.  Patient's lines as she woke up this morning with a temperature 102, she also has body aches and chills. She also has new onset bilateral lower extremity edema.  She has a knee replacement scheduled for next Monday, in 6 days, she was told the common be evaluated by her surgeon.  She denies any shortness of breath. She has a mild cough. She has no chest pain or orthopnea, no exertional dyspnea. She's tolerating food and fluids normally  PMH: Smoking status noted ROS: Per HPI  Objective: BP 90/61 mmHg  Pulse 85  Temp(Src) 99 F (37.2 C) (Oral)  Ht _0  (1.549 m)  Wt 182 lb 9.6 oz (82.827 kg)  BMI 34.52 kg/m2 Gen: NAD, alert, cooperative with exam HEENT: NCAT CV: RRR, good S1/S2, no murmur Resp: CTABL, no wheezes, non-labored Ext: 2 + pitting edema BL LE Neuro: Alert and oriented, No gross deficits  Assessment and plan:  # Fever, edema Unclear etiology, no clear signs of infectious etiology Temp normal here Labs UA RTC in 2-3 days, has knee replacement scheduled so will see her again to make sure she is ready for surgery.     Orders Placed This Encounter  Procedures  . Veritor Flu A/B Waived    Order Specific Question:  Source    Answer:  nose  . Urinalysis, Complete  . CMP14+EGFR  . CBC with [place], MD [facility] 05/01/2015, 2:51 PM

## 2015-05-02 LAB — CBC WITH DIFFERENTIAL/PLATELET
Basophils Absolute: 0 10*3/uL (ref 0.0–0.2)
Basos: 0 %
EOS (ABSOLUTE): 0 10*3/uL (ref 0.0–0.4)
Eos: 0 %
Hematocrit: 34.6 % (ref 34.0–46.6)
Hemoglobin: 11.5 g/dL (ref 11.1–15.9)
Immature Grans (Abs): 0 10*3/uL (ref 0.0–0.1)
Immature Granulocytes: 0 %
Lymphocytes Absolute: 2.9 10*3/uL (ref 0.7–3.1)
Lymphs: 24 %
MCH: 30.4 pg (ref 26.6–33.0)
MCHC: 33.2 g/dL (ref 31.5–35.7)
MCV: 92 fL (ref 79–97)
Monocytes Absolute: 1.1 10*3/uL — ABNORMAL HIGH (ref 0.1–0.9)
Monocytes: 9 %
Neutrophils Absolute: 7.8 10*3/uL — ABNORMAL HIGH (ref 1.4–7.0)
Neutrophils: 67 %
Platelets: 249 10*3/uL (ref 150–379)
RBC: 3.78 x10E6/uL (ref 3.77–5.28)
RDW: 13.4 % (ref 12.3–15.4)
WBC: 11.9 10*3/uL — ABNORMAL HIGH (ref 3.4–10.8)

## 2015-05-02 LAB — CMP14+EGFR
ALT: 35 IU/L — ABNORMAL HIGH (ref 0–32)
AST: 25 IU/L (ref 0–40)
Albumin/Globulin Ratio: 2.1 (ref 1.2–2.2)
Albumin: 4.1 g/dL (ref 3.5–5.5)
Alkaline Phosphatase: 64 IU/L (ref 39–117)
BUN/Creatinine Ratio: 17 (ref 9–23)
BUN: 11 mg/dL (ref 6–24)
Bilirubin Total: 0.4 mg/dL (ref 0.0–1.2)
CO2: 25 mmol/L (ref 18–29)
Calcium: 8.8 mg/dL (ref 8.7–10.2)
Chloride: 102 mmol/L (ref 96–106)
Creatinine, Ser: 0.64 mg/dL (ref 0.57–1.00)
GFR calc Af Amer: 121 mL/min/{1.73_m2} (ref >59)
GFR calc non Af Amer: 105 mL/min/{1.73_m2} (ref >59)
Globulin, Total: 2 g/dL (ref 1.5–4.5)
Glucose: 93 mg/dL (ref 65–99)
Potassium: 4.4 mmol/L (ref 3.5–5.2)
Sodium: 142 mmol/L (ref 134–144)
Total Protein: 6.1 g/dL (ref 6.0–8.5)

## 2015-05-03 ENCOUNTER — Encounter: Payer: Self-pay | Admitting: Family Medicine

## 2015-05-03 ENCOUNTER — Ambulatory Visit (INDEPENDENT_AMBULATORY_CARE_PROVIDER_SITE_OTHER): Payer: PPO | Admitting: Family Medicine

## 2015-05-03 VITALS — BP 110/76 | HR 82 | Temp 97.4°F | Ht 61.0 in | Wt 179.6 lb

## 2015-05-03 DIAGNOSIS — A084 Viral intestinal infection, unspecified: Secondary | ICD-10-CM

## 2015-05-03 DIAGNOSIS — R509 Fever, unspecified: Secondary | ICD-10-CM | POA: Diagnosis not present

## 2015-05-03 NOTE — Progress Notes (Signed)
BP 110/76 mmHg  Pulse 82  Temp(Src) 97.4 F (36.3 C) (Oral)  Ht _0  (1.549 m)  Wt 179 lb 9.6 oz (81.466 kg)  BMI 33.95 kg/m2   Subjective:    Patient ID: [name], female    DOB: 1965-02-08, 50 y.o.   MRN: [id number]  HPI: [name] is a 50 y.o. female presenting on 05/03/2015 for Followup fever and edema in left leg   HPI Fevers and chills and diarrhea Patient is coming in today for a recheck because she is going in for her preop tomorrow in her operation later in the week for a knee replacement of her left knee. She still has a little bit of swelling in that left leg below the knee but her fevers and chills and diarrhea and all of the other symptoms that she was having when she was seen here 2 days ago are gone. She is feeling well and healthy and has no other elements currently.She said the diarrhea developed after her visit when she was seen a couple days ago. It was also resolved that same day.  Relevant past medical, surgical, family and social history reviewed and updated as indicated. Interim medical history since our last visit reviewed. Allergies and medications reviewed and updated.  Review of Systems  Constitutional: Negative for fever and chills.  HENT: Negative for congestion, ear discharge and ear pain.   Eyes: Negative for redness and visual disturbance.  Respiratory: Negative for chest tightness and shortness of breath.   Cardiovascular: Positive for leg swelling. Negative for chest pain.  Genitourinary: Negative for dysuria and difficulty urinating.  Musculoskeletal: Negative for back pain and gait problem.  Skin: Negative for rash.  Neurological: Negative for dizziness, light-headedness and headaches.  Psychiatric/Behavioral: Negative for behavioral problems and agitation.  All other systems reviewed and are negative.   Per HPI unless specifically indicated above     Medication List       This list is accurate as of: 05/03/15  1:18 PM.   Always use your most recent med list.               citalopram 40 MG tablet  Commonly known as:  CELEXA  Take 1 tablet (40 mg total) by mouth daily.     clonazePAM 0.5 MG tablet  Commonly known as:  KLONOPIN  TAke one each morning and as needed for seizure. Take one at bedtime. Repeat one hour later if needed for sleep.     gabapentin 600 MG tablet  Commonly known as:  NEURONTIN  Take 600 mg by mouth 3 (three) times daily.     lamoTRIgine 100 MG tablet  Commonly known as:  LAMICTAL  Take 1 tablet (100 mg total) by mouth 2 (two) times daily. Keep 12 hours apart.     levETIRAcetam 500 MG tablet  Commonly known as:  KEPPRA  Take 2 tablets PO in the morning and 3 tablets PO in the evening.     multivitamin with minerals Tabs tablet  Take 1 tablet by mouth daily.     pramipexole 0.5 MG tablet  Commonly known as:  MIRAPEX  TAKE ONE TABLET BY MOUTH TWICE DAILY           Objective:    BP 110/76 mmHg  Pulse 82  Temp(Src) 97.4 F (36.3 C) (Oral)  Ht _1  (1.549 m)  Wt 179 lb 9.6 oz (81.466 kg)  BMI 33.95 kg/m2  Wt Readings from Last 3 Encounters:  05/03/15 179 lb 9.6 oz (81.466 kg)  05/01/15 182 lb 9.6 oz (82.827 kg)  04/23/15 178 lb (80.74 kg)    Physical Exam  Constitutional: She is oriented to person, place, and time. She appears well-developed and well-nourished. No distress.  Eyes: Conjunctivae and EOM are normal. Pupils are equal, round, and reactive to light.  Neck: Neck supple. No thyromegaly present.  Cardiovascular: Normal rate, regular rhythm, normal heart sounds and intact distal pulses.   No murmur heard. Pulmonary/Chest: Effort normal and breath sounds normal. No respiratory distress. She has no wheezes.  Musculoskeletal: Normal range of motion. She exhibits no edema or tenderness.  Lymphadenopathy:    She has no cervical adenopathy.  Neurological: She is alert and oriented to person, place, and time. Coordination normal.  Skin: Skin is warm and dry.  No rash noted. She is not diaphoretic.  Psychiatric: She has a normal mood and affect. Her behavior is normal.  Nursing note and vitals reviewed.   Results for orders placed or performed in visit on 05/01/15  Veritor Flu A/B Waived  Result Value Ref Range   Influenza A Negative Negative   Influenza B Negative Negative  CMP14+EGFR  Result Value Ref Range   Glucose 93 65 - 99 mg/dL   BUN 11 6 - 24 mg/dL   Creatinine, Ser 0.64 0.57 - 1.00 mg/dL   GFR calc non Af Amer 105 >59 mL/min/1.73   GFR calc Af Amer 121 >59 mL/min/1.73   BUN/Creatinine Ratio 17 9 - 23   Sodium 142 134 - 144 mmol/L   Potassium 4.4 3.5 - 5.2 mmol/L   Chloride 102 96 - 106 mmol/L   CO2 25 18 - 29 mmol/L   Calcium 8.8 8.7 - 10.2 mg/dL   Total Protein 6.1 6.0 - 8.5 g/dL   Albumin 4.1 3.5 - 5.5 g/dL   Globulin, Total 2.0 1.5 - 4.5 g/dL   Albumin/Globulin Ratio 2.1 1.2 - 2.2   Bilirubin Total 0.4 0.0 - 1.2 mg/dL   Alkaline Phosphatase 64 39 - 117 IU/L   AST 25 0 - 40 IU/L   ALT 35 (H) 0 - 32 IU/L  CBC with Differential/Platelet  Result Value Ref Range   WBC 11.9 (H) 3.4 - 10.8 x10E3/uL   RBC 3.78 3.77 - 5.28 x10E6/uL   Hemoglobin 11.5 11.1 - 15.9 g/dL   Hematocrit 34.6 34.0 - 46.6 %   MCV 92 79 - 97 fL   MCH 30.4 26.6 - 33.0 pg   MCHC 33.2 31.5 - 35.7 g/dL   RDW 13.4 12.3 - 15.4 %   Platelets 249 150 - 379 x10E3/uL   Neutrophils 67 %   Lymphs 24 %   Monocytes 9 %   Eos 0 %   Basos 0 %   Neutrophils Absolute 7.8 (H) 1.4 - 7.0 x10E3/uL   Lymphocytes Absolute 2.9 0.7 - 3.1 x10E3/uL   Monocytes Absolute 1.1 (H) 0.1 - 0.9 x10E3/uL   EOS (ABSOLUTE) 0.0 0.0 - 0.4 x10E3/uL   Basophils Absolute 0.0 0.0 - 0.2 x10E3/uL   Immature Granulocytes 0 %   Immature Grans (Abs) 0.0 0.0 - 0.1 x10E3/uL      Assessment & Plan:   Problem List Items Addressed This Visit    None    Visit Diagnoses    Fever, unspecified    -  Primary    Resolved, no fever since the 18th 2 days ago.    Viral gastroenteritis  She developed diarrhea later that day with the fever, likely with a viral gastritis, none since that day.       Follow up plan: Return if symptoms worsen or fail to improve.  Counseling provided for all of the vaccine components No orders of the defined types were placed in this encounter.    Caryl Pina, MD [facility] 05/03/2015, 1:18 PM

## 2015-05-04 ENCOUNTER — Encounter (HOSPITAL_COMMUNITY)
Admission: RE | Admit: 2015-05-04 | Discharge: 2015-05-04 | Disposition: A | Discharge status: Home / Self Care | Payer: PPO | Source: Ambulatory Visit | Attending: Orthopedic Surgery | Admitting: Orthopedic Surgery

## 2015-05-04 ENCOUNTER — Encounter (HOSPITAL_COMMUNITY): Payer: Self-pay

## 2015-05-04 DIAGNOSIS — Z01812 Encounter for preprocedural laboratory examination: Secondary | ICD-10-CM | POA: Diagnosis not present

## 2015-05-04 DIAGNOSIS — G40909 Epilepsy, unspecified, not intractable, without status epilepticus: Secondary | ICD-10-CM | POA: Diagnosis not present

## 2015-05-04 DIAGNOSIS — M25562 Pain in left knee: Secondary | ICD-10-CM | POA: Diagnosis not present

## 2015-05-04 DIAGNOSIS — G2581 Restless legs syndrome: Secondary | ICD-10-CM | POA: Diagnosis not present

## 2015-05-04 DIAGNOSIS — Z96642 Presence of left artificial hip joint: Secondary | ICD-10-CM | POA: Diagnosis not present

## 2015-05-04 DIAGNOSIS — R569 Unspecified convulsions: Secondary | ICD-10-CM | POA: Diagnosis not present

## 2015-05-04 DIAGNOSIS — M1712 Unilateral primary osteoarthritis, left knee: Secondary | ICD-10-CM | POA: Diagnosis not present

## 2015-05-04 DIAGNOSIS — F419 Anxiety disorder, unspecified: Secondary | ICD-10-CM | POA: Diagnosis not present

## 2015-05-04 DIAGNOSIS — M179 Osteoarthritis of knee, unspecified: Secondary | ICD-10-CM | POA: Diagnosis not present

## 2015-05-04 DIAGNOSIS — Z79899 Other long term (current) drug therapy: Secondary | ICD-10-CM | POA: Diagnosis not present

## 2015-05-04 DIAGNOSIS — F329 Major depressive disorder, single episode, unspecified: Secondary | ICD-10-CM | POA: Diagnosis not present

## 2015-05-04 DIAGNOSIS — M170 Bilateral primary osteoarthritis of knee: Secondary | ICD-10-CM | POA: Diagnosis not present

## 2015-05-04 DIAGNOSIS — M19071 Primary osteoarthritis, right ankle and foot: Secondary | ICD-10-CM | POA: Diagnosis not present

## 2015-05-04 HISTORY — DX: Dermatitis, unspecified: L30.9

## 2015-05-04 HISTORY — DX: Fatty (change of) liver, not elsewhere classified: K76.0

## 2015-05-04 LAB — URINALYSIS, ROUTINE W REFLEX MICROSCOPIC
Bilirubin Urine: NEGATIVE
Glucose, UA: NEGATIVE mg/dL
Hgb urine dipstick: NEGATIVE
Ketones, ur: NEGATIVE mg/dL
Leukocytes, UA: NEGATIVE
Nitrite: NEGATIVE
Protein, ur: NEGATIVE mg/dL
Specific Gravity, Urine: 1.021 (ref 1.005–1.030)
pH: 8 (ref 5.0–8.0)

## 2015-05-04 LAB — APTT: aPTT: 32 seconds (ref 24–37)

## 2015-05-04 LAB — PROTIME-INR
INR: 1.1 (ref 0.00–1.49)
Prothrombin Time: 14 seconds (ref 11.6–15.2)

## 2015-05-04 LAB — ABO/RH: ABO/RH(D): A POS

## 2015-05-04 LAB — SURGICAL PCR SCREEN
MRSA, PCR: NEGATIVE
Staphylococcus aureus: NEGATIVE

## 2015-05-04 NOTE — Pre-Procedure Instructions (Addendum)
EKG 01-23-15 epic Neuro Clearance Dr. Brett Fairy, on chart - with instructions (see clearance in chart) PCP Clearance Dr. Laurance Flatten, on chart

## 2015-05-04 NOTE — Patient Instructions (Addendum)
[name]  05/04/2015   Your procedure is scheduled on: 05/07/15  Report to [facility]  Entrance take [facility]  elevators to 3rd floor to  [place] at 6:00AM.  Call this number if you have problems the morning of surgery [phone]   Remember: ONLY 1 PERSON MAY GO WITH YOU TO SHORT STAY TO GET  READY MORNING OF [place].  Do not eat food or drink liquids :After Midnight.     Take these medicines the morning of surgery with A SIP OF WATER: Citalopram (Celexa), Clonazepam (Klonopin), Gabapentin (Neurontin). Lamotrigine (Lamictal), Pramipexole (Mirapex)                                You may not have any metal on your body including hair pins and              piercings  Do not wear jewelry, make-up, lotions, powders or perfumes, deodorant             Do not wear nail polish.  Do not shave  48 hours prior to surgery.              Men may shave face and neck.   Do not bring valuables to the hospital. [place].  Contacts, dentures or bridgework may not be worn into surgery.  Leave suitcase in the car. After surgery it may be brought to your room.               Please read over the following fact sheets you were given: _____________________________________________________________________             [facility] - Preparing for Surgery Before surgery, you can play an important role.  Because skin is not sterile, your skin needs to be as free of germs as possible.  You can reduce the number of germs on your skin by washing with CHG (chlorahexidine gluconate) soap before surgery.  CHG is an antiseptic cleaner which kills germs and bonds with the skin to continue killing germs even after washing. Please DO NOT use if you have an allergy to CHG or antibacterial soaps.  If your skin becomes reddened/irritated stop using the CHG and inform your nurse when you arrive at Short Stay. Do not shave  (including legs and underarms) for at least 48 hours prior to the first CHG shower.  You may shave your face/neck. Please follow these instructions carefully:  1.  Shower with CHG Soap the night before surgery and the  morning of Surgery.  2.  If you choose to wash your hair, wash your hair first as usual with your  normal  shampoo.  3.  After you shampoo, rinse your hair and body thoroughly to remove the  shampoo.                           4.  Use CHG as you would any other liquid soap.  You can apply chg directly  to the skin and wash                       Gently with a scrungie or clean washcloth.  5.  Apply the CHG Soap to your body ONLY FROM THE NECK DOWN.   Do not use on face/ open                           Wound or open sores. Avoid contact with eyes, ears mouth and genitals (private parts).                       Wash face,  Genitals (private parts) with your normal soap.             6.  Wash thoroughly, paying special attention to the area where your surgery  will be performed.  7.  Thoroughly rinse your body with warm water from the neck down.  8.  DO NOT shower/wash with your normal soap after using and rinsing off  the CHG Soap.                9.  Pat yourself dry with a clean towel.            10.  Wear clean pajamas.            11.  Place clean sheets on your bed the night of your first shower and do not  sleep with pets. Day of Surgery : Do not apply any lotions/deodorants the morning of surgery.  Please wear clean clothes to the hospital/surgery center.  FAILURE TO FOLLOW THESE INSTRUCTIONS MAY RESULT IN THE CANCELLATION OF YOUR SURGERY PATIENT SIGNATURE_________________________________  NURSE SIGNATURE__________________________________  ________________________________________________________________________   Adam Phenix  An incentive spirometer is a tool that can help keep your lungs clear and active. This tool measures how well you are filling your lungs with  each breath. Taking long deep breaths may help reverse or decrease the chance of developing breathing (pulmonary) problems (especially infection) following:  A long period of time when you are unable to move or be active. BEFORE THE PROCEDURE   If the spirometer includes an indicator to show your best effort, your nurse or respiratory therapist will set it to a desired goal.  If possible, sit up straight or lean slightly forward. Try not to slouch.  Hold the incentive spirometer in an upright position. INSTRUCTIONS FOR USE   Sit on the edge of your bed if possible, or sit up as far as you can in bed or on a chair.  Hold the incentive spirometer in an upright position.  Breathe out normally.  Place the mouthpiece in your mouth and seal your lips tightly around it.  Breathe in slowly and as deeply as possible, raising the piston or the ball toward the top of the column.  Hold your breath for 3-5 seconds or for as long as possible. Allow the piston or ball to fall to the bottom of the column.  Remove the mouthpiece from your mouth and breathe out normally.  Rest for a few seconds and repeat Steps 1 through 7 at least 10 times every 1-2 hours when you are awake. Take your time and take a few normal breaths between deep breaths.  The spirometer may include an indicator to show your best effort. Use the indicator as a goal to work toward during each repetition.  After each set of 10 deep breaths, practice coughing to be sure your lungs are clear. If you have an incision (the cut made at the time of surgery), support your incision when coughing by placing a pillow or  rolled up towels firmly against it. Once you are able to get out of bed, walk around indoors and cough well. You may stop using the incentive spirometer when instructed by your caregiver.  RISKS AND COMPLICATIONS  Take your time so you do not get dizzy or light-headed.  If you are in pain, you may need to take or ask for  pain medication before doing incentive spirometry. It is harder to take a deep breath if you are having pain. AFTER USE  Rest and breathe slowly and easily.  It can be helpful to keep track of a log of your progress. Your caregiver can provide you with a simple table to help with this. If you are using the spirometer at home, follow these instructions: [place] IF:   You are having difficultly using the spirometer.  You have trouble using the spirometer as often as instructed.  Your pain medication is not giving enough relief while using the spirometer.  You develop fever of 100.5 F (38.1 C) or higher. SEEK IMMEDIATE MEDICAL CARE IF:   You cough up bloody sputum that had not been present before.  You develop fever of 102 F (38.9 C) or greater.  You develop worsening pain at or near the incision site. MAKE SURE YOU:   Understand these instructions.  Will watch your condition.  Will get help right away if you are not doing well or get worse. Document Released: 05/12/2006 Document Revised: 03/24/2011 Document Reviewed: 07/13/2006 ExitCare Patient Information 2014 ExitCare, [place].   ________________________________________________________________________  WHAT IS A BLOOD TRANSFUSION? Blood Transfusion Information  A transfusion is the replacement of blood or some of its parts. Blood is made up of multiple cells which provide different functions.  Red blood cells carry oxygen and are used for blood loss replacement.  White blood cells fight against infection.  Platelets control bleeding.  Plasma helps clot blood.  Other blood products are available for specialized needs, such as hemophilia or other clotting disorders. BEFORE THE TRANSFUSION  Who gives blood for transfusions?   Healthy volunteers who are fully evaluated to make sure their blood is safe. This is blood bank blood. Transfusion therapy is the safest it has ever been in the practice of medicine.  Before blood is taken from a donor, a complete history is taken to make sure that person has no history of diseases nor engages in risky social behavior (examples are intravenous drug use or sexual activity with multiple partners). The donor's travel history is screened to minimize risk of transmitting infections, such as malaria. The donated blood is tested for signs of infectious diseases, such as HIV and hepatitis. The blood is then tested to be sure it is compatible with you in order to minimize the chance of a transfusion reaction. If you or a relative donates blood, this is often done in anticipation of surgery and is not appropriate for emergency situations. It takes many days to process the donated blood. RISKS AND COMPLICATIONS Although transfusion therapy is very safe and saves many lives, the main dangers of transfusion include:   Getting an infectious disease.  Developing a transfusion reaction. This is an allergic reaction to something in the blood you were given. Every precaution is taken to prevent this. The decision to have a blood transfusion has been considered carefully by your caregiver before blood is given. Blood is not given unless the benefits outweigh the risks. AFTER THE TRANSFUSION  Right after receiving a blood transfusion, you will usually  feel much better and more energetic. This is especially true if your red blood cells have gotten low (anemic). The transfusion raises the level of the red blood cells which carry oxygen, and this usually causes an energy increase.  The nurse administering the transfusion will monitor you carefully for complications. HOME CARE INSTRUCTIONS  No special instructions are needed after a transfusion. You may find your energy is better. Speak with your caregiver about any limitations on activity for underlying diseases you may have. SEEK MEDICAL CARE IF:   Your condition is not improving after your transfusion.  You develop redness or  irritation at the intravenous (IV) site. SEEK IMMEDIATE MEDICAL CARE IF:  Any of the following symptoms occur over the next 12 hours:  Shaking chills.  You have a temperature by mouth above 102 F (38.9 C), not controlled by medicine.  Chest, back, or muscle pain.  People around you feel you are not acting correctly or are confused.  Shortness of breath or difficulty breathing.  Dizziness and fainting.  You get a rash or develop hives.  You have a decrease in urine output.  Your urine turns a dark color or changes to pink, red, or brown. Any of the following symptoms occur over the next 10 days:  You have a temperature by mouth above 102 F (38.9 C), not controlled by medicine.  Shortness of breath.  Weakness after normal activity.  The white part of the eye turns yellow (jaundice).  You have a decrease in the amount of urine or are urinating less often.  Your urine turns a dark color or changes to pink, red, or brown. Document Released: 12/28/1999 Document Revised: 03/24/2011 Document Reviewed: 08/16/2007 [facility] Patient Information 2014 [place].

## 2015-05-06 ENCOUNTER — Ambulatory Visit: Payer: Self-pay | Admitting: Orthopedic Surgery

## 2015-05-06 NOTE — H&P (Signed)
[name] DOB: 1965-12-07 Married / Language: English / Race: White Female Date of Admission:  05/07/2015 CC:  Left Knee Pain History of Present Illness  The patient is a 50 year old female who comes in for a preoperative History and Physical. The patient is scheduled for a left total knee arthroplasty to be performed by Dr. Dione Plover. Aluisio, MD at [facility] on 05-07-2015. The patient is a 50 year old female who present for follow up of their knee. The patient is being followed for their bilateral knee pain and osteoarthritis. They are now out from bilateral knee cortisone injections which only lasted for about 1 week. Symptoms reported include: pain (w/ buckling:causing pt. to fall), aching, catching, popping, grinding, giving way and difficulty ambulating. The patient feels that they are doing poorly and report their pain level to be moderate to severe. The patient has not gotten any relief of their symptoms with Cortisone injections or viscosupplementation. She states she was hospitalized in the recent past for seizures and was told she cannot take any more pain medicine. Unfortunately, her left knee is progressively worse to the point where she could barely walk on it now. She states it is hurting at all times. It is limiting which she can and cannot do. It gives out on her. She has documented bone-on-bone lateral patellofemoral arthritis.  She has been cleared by her neurologist to proceed with surgery. Dr. Roddie Mc stated that she needed her Keppra dose given to her the day of surgery IV. The PO to IV dose is 1:1 ratio. They have been treated conservatively in the past for the above stated problem and despite conservative measures, they continue to have progressive pain and severe functional limitations and dysfunction. They have failed non-operative management including home exercise, medications, and injections. It is felt that they would benefit from undergoing total joint  replacement. Risks and benefits of the procedure have been discussed with the patient and they elect to proceed with surgery. There are no active contraindications to surgery such as ongoing infection or rapidly progressive neurological disease with the exception of her seizures that has recently been evaluated by Dr. Brett Fairy.  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 PENICILLIN 05/27/1998 Swelling. Patient is able to take Keflex without difficulty. VICODIN 05/27/1998 Itching. DARVOCET 02/06/2005 Skin Rashes. Benadryl *ANTIHISTAMINES*  "Makes me hyper"  Family History  Bleeding disorder  brother Cancer  mother, grandmother mothers side, grandfather mothers side and grandmother fathers side Depression  father Diabetes Mellitus  brother, grandmother mothers side and grandfather mothers side Drug / Alcohol Addiction  father Heart disease in female family member before age 60  Kidney disease  father Severe allergy  father  Social History Alcohol use  never consumed alcohol Tobacco use  never smoker Children  2 Current work status  disabled Drug/Alcohol Rehab (Currently)  no Drug/Alcohol Rehab (Previously)  yes Exercise  Exercises rarely Illicit drug use  yes Living situation  live with spouse Marital status  married Number of flights of stairs before winded  1 Pain Contract  no Tobacco / smoke exposure  yes outdoors only Post-Surgical  Plans  Home  Medication History CeleXA (20MG  Tablet, Oral) Active. ClonazePAM (0.5MG  Tablet, Oral)  Active. LamoTRIgine (100MG  Tablet, Oral) Active. Pramipexole Dihydrochloride (0.5MG  Tablet, Oral) Active. Keppra (500MG  Tablet, 2 Oral Twice a day) Active. Gabapentin (600MG  Tablet, Oral) Active.  Past Surgical History Arthroscopy of Knee  left Arthroscopy of Shoulder  right Cesarean Delivery  2 times Colon Polyp Removal - Colonoscopy  Foot Surgery  bilateral Hysterectomy  partial (non-cancerous) Total Hip Replacement  left Tubal Ligation     Review of Systems  General Present- Fatigue. Not Present- Chills, Fever, Memory Loss, Night Sweats, Weight Gain and Weight Loss. Skin Not Present- Eczema, Hives, Itching, Lesions and Rash. HEENT Not Present- Dentures, Double Vision, Headache, Hearing Loss, Tinnitus and Visual Loss. Respiratory Not Present- Allergies, Chronic Cough, Coughing up blood, Shortness of breath at rest and Shortness of breath with exertion. Cardiovascular Not Present- Chest Pain, Difficulty Breathing Lying Down, Murmur, Palpitations, Racing/skipping heartbeats and Swelling. Gastrointestinal Present- Constipation. Not Present- Abdominal Pain, Bloody Stool, Diarrhea, Difficulty Swallowing, Heartburn, Jaundice, Loss of appetitie, Nausea and Vomiting. Female Genitourinary Not Present- Blood in Urine, Discharge, Flank Pain, Incontinence, Painful Urination, Urgency, Urinary frequency, Urinary Retention, Urinating at Night and Weak urinary stream. Musculoskeletal Present- Joint Pain, Joint Swelling, Morning Stiffness, Muscle Pain and Muscle Weakness. Not Present- Back Pain and Spasms. Neurological Not Present- Blackout spells, Difficulty with balance, Dizziness, Paralysis, Tremor and Weakness. Psychiatric Present- Insomnia.  Vitals  Weight: 173 lb Height: 60in Body Surface Area: 1.76 m Body Mass Index: 33.79 kg/m  Pulse: 88 (Regular)  BP: 112/70 (Sitting, Right Arm, Standard)  Physical Exam  General Mental Status -Alert, cooperative and good  historian. General Appearance-pleasant, Not in acute distress. Orientation-Oriented X3. Build & Nutrition-Well nourished and Well developed.  Head and Neck Head-normocephalic, atraumatic . Neck Global Assessment - supple, no bruit auscultated on the right, no bruit auscultated on the left.  Eye Vision-Wears corrective lenses. Pupil - Bilateral-Regular and Round. Motion - Bilateral-EOMI.  Chest and Lung Exam Auscultation Breath sounds - clear at anterior chest wall and clear at posterior chest wall. Adventitious sounds - No Adventitious sounds.  Cardiovascular Auscultation Rhythm - Regular rate and rhythm. Heart Sounds - S1 WNL and S2 WNL. Murmurs & Other Heart Sounds - Auscultation of the heart reveals - No Murmurs.  Abdomen Palpation/Percussion Tenderness - Abdomen is non-tender to palpation. Rigidity (guarding) - Abdomen is soft. Auscultation Auscultation of the abdomen reveals - Bowel sounds normal.  Female Genitourinary Note: Not done, not pertinent to present illness   Musculoskeletal Note: She is in no distress. Her left knee shows no effusion. Range is about 5 to 120. There is moderate crepitus on range of motion and significant tenderness, lateral greater than medial and no instability noted.  RADIOGRAPHS Her radiographs do show bone-on-bone arthritis, lateral patellofemoral left knee as well as bone-on-bone medial patellofemoral right knee.   Assessment & Plan Primary osteoarthritis of right knee (M17.11) Primary osteoarthritis of left knee (M17.12)  Note:Surgical Plans: Left Total Knee Replacement  Disposition: Home  PCP: Dr. Laurance Flatten - Patient has been seen preoperatively and felt to be stable for surgery. Neuro: Dr. Brett Fairy - Patient has been seen preoperatively and felt to be stable for surgery.  IV TXA  Anesthesia Issues: None **Please note that the pateint's neoruologist wants her to receive her Keppra dose the day of surgery  and states that it can be given IV. The PO to IV dose is 1:1.**  Signed electronically by Ok Edwards, III PA-C

## 2015-05-07 ENCOUNTER — Inpatient Hospital Stay (HOSPITAL_COMMUNITY): Payer: PPO | Admitting: Anesthesiology

## 2015-05-07 ENCOUNTER — Encounter (HOSPITAL_COMMUNITY): Payer: Self-pay | Admitting: Certified Registered"

## 2015-05-07 ENCOUNTER — Inpatient Hospital Stay (HOSPITAL_COMMUNITY)
Admission: RE | Admit: 2015-05-07 | Discharge: 2015-05-09 | DRG: 470 | Disposition: A | Discharge status: Home Health | Payer: PPO | Source: Ambulatory Visit | Attending: Orthopedic Surgery | Admitting: Orthopedic Surgery

## 2015-05-07 ENCOUNTER — Encounter (HOSPITAL_COMMUNITY)
Admission: RE | Disposition: A | Discharge status: Home Health | Payer: Self-pay | Source: Ambulatory Visit | Attending: Orthopedic Surgery

## 2015-05-07 DIAGNOSIS — G40909 Epilepsy, unspecified, not intractable, without status epilepticus: Secondary | ICD-10-CM | POA: Diagnosis present

## 2015-05-07 DIAGNOSIS — F419 Anxiety disorder, unspecified: Secondary | ICD-10-CM | POA: Diagnosis not present

## 2015-05-07 DIAGNOSIS — M19071 Primary osteoarthritis, right ankle and foot: Secondary | ICD-10-CM | POA: Diagnosis present

## 2015-05-07 DIAGNOSIS — Z01812 Encounter for preprocedural laboratory examination: Secondary | ICD-10-CM | POA: Diagnosis not present

## 2015-05-07 DIAGNOSIS — M1712 Unilateral primary osteoarthritis, left knee: Secondary | ICD-10-CM | POA: Diagnosis not present

## 2015-05-07 DIAGNOSIS — M179 Osteoarthritis of knee, unspecified: Secondary | ICD-10-CM | POA: Diagnosis not present

## 2015-05-07 DIAGNOSIS — Z79899 Other long term (current) drug therapy: Secondary | ICD-10-CM

## 2015-05-07 DIAGNOSIS — M25562 Pain in left knee: Secondary | ICD-10-CM | POA: Diagnosis not present

## 2015-05-07 DIAGNOSIS — Z96642 Presence of left artificial hip joint: Secondary | ICD-10-CM | POA: Diagnosis not present

## 2015-05-07 DIAGNOSIS — F329 Major depressive disorder, single episode, unspecified: Secondary | ICD-10-CM | POA: Diagnosis present

## 2015-05-07 DIAGNOSIS — G2581 Restless legs syndrome: Secondary | ICD-10-CM | POA: Diagnosis not present

## 2015-05-07 DIAGNOSIS — R569 Unspecified convulsions: Secondary | ICD-10-CM | POA: Diagnosis not present

## 2015-05-07 DIAGNOSIS — M170 Bilateral primary osteoarthritis of knee: Principal | ICD-10-CM | POA: Diagnosis present

## 2015-05-07 DIAGNOSIS — M1710 Unilateral primary osteoarthritis, unspecified knee: Secondary | ICD-10-CM | POA: Diagnosis present

## 2015-05-07 HISTORY — PX: TOTAL KNEE ARTHROPLASTY: SHX125

## 2015-05-07 LAB — TYPE AND SCREEN
ABO/RH(D): A POS
Antibody Screen: NEGATIVE

## 2015-05-07 SURGERY — ARTHROPLASTY, KNEE, TOTAL
Anesthesia: Spinal | Site: Knee | Laterality: Left

## 2015-05-07 MED ORDER — DOCUSATE SODIUM 100 MG PO CAPS
100.0000 mg | ORAL_CAPSULE | Freq: Two times a day (BID) | ORAL | Status: DC
  Administered 2015-05-07 – 2015-05-09 (×4): 100 mg via ORAL

## 2015-05-07 MED ORDER — ONDANSETRON HCL 4 MG/2ML IJ SOLN
INTRAMUSCULAR | Status: AC
  Filled 2015-05-07: qty 2

## 2015-05-07 MED ORDER — PRAMIPEXOLE DIHYDROCHLORIDE 0.25 MG PO TABS
0.5000 mg | ORAL_TABLET | Freq: Two times a day (BID) | ORAL | Status: DC
  Administered 2015-05-07 – 2015-05-09 (×4): 0.5 mg via ORAL
  Filled 2015-05-07 (×5): qty 2

## 2015-05-07 MED ORDER — PROPOFOL 500 MG/50ML IV EMUL
INTRAVENOUS | Status: DC | PRN
  Administered 2015-05-07: 75 ug/kg/min via INTRAVENOUS

## 2015-05-07 MED ORDER — ONDANSETRON HCL 4 MG/2ML IJ SOLN: 4.0000 mg | Freq: Once | INTRAMUSCULAR | Status: DC | PRN

## 2015-05-07 MED ORDER — MENTHOL 3 MG MT LOZG: 1.0000 | LOZENGE | OROMUCOSAL | Status: DC | PRN

## 2015-05-07 MED ORDER — BUPIVACAINE HCL (PF) 0.25 % IJ SOLN
INTRAMUSCULAR | Status: AC
  Filled 2015-05-07: qty 30

## 2015-05-07 MED ORDER — PROPOFOL 10 MG/ML IV BOLUS
INTRAVENOUS | Status: AC
  Filled 2015-05-07: qty 20

## 2015-05-07 MED ORDER — ONDANSETRON HCL 4 MG PO TABS: 4.0000 mg | ORAL_TABLET | Freq: Four times a day (QID) | ORAL | Status: DC | PRN

## 2015-05-07 MED ORDER — LEVETIRACETAM 500 MG PO TABS
1000.0000 mg | ORAL_TABLET | Freq: Two times a day (BID) | ORAL | Status: DC
  Administered 2015-05-07 – 2015-05-09 (×4): 1000 mg via ORAL
  Filled 2015-05-07 (×5): qty 2

## 2015-05-07 MED ORDER — LIDOCAINE HCL (CARDIAC) 20 MG/ML IV SOLN
INTRAVENOUS | Status: DC | PRN
  Administered 2015-05-07: 50 mg via INTRAVENOUS

## 2015-05-07 MED ORDER — 0.9 % SODIUM CHLORIDE (POUR BTL) OPTIME
TOPICAL | Status: DC | PRN
  Administered 2015-05-07: 1000 mL

## 2015-05-07 MED ORDER — METHOCARBAMOL 500 MG PO TABS
500.0000 mg | ORAL_TABLET | Freq: Four times a day (QID) | ORAL | Status: DC | PRN
  Administered 2015-05-07 – 2015-05-08 (×3): 500 mg via ORAL
  Filled 2015-05-07 (×4): qty 1

## 2015-05-07 MED ORDER — ACETAMINOPHEN 10 MG/ML IV SOLN
INTRAVENOUS | Status: DC | PRN
  Administered 2015-05-07: 500 mg via INTRAVENOUS

## 2015-05-07 MED ORDER — LEVETIRACETAM 500 MG PO TABS
500.0000 mg | ORAL_TABLET | Freq: Every day | ORAL | Status: DC
  Administered 2015-05-07 – 2015-05-08 (×2): 500 mg via ORAL
  Filled 2015-05-07 (×3): qty 1

## 2015-05-07 MED ORDER — FENTANYL CITRATE (PF) 100 MCG/2ML IJ SOLN
INTRAMUSCULAR | Status: DC | PRN
  Administered 2015-05-07: 100 ug via INTRAVENOUS

## 2015-05-07 MED ORDER — FLEET ENEMA 7-19 GM/118ML RE ENEM: 1.0000 | ENEMA | Freq: Once | RECTAL | Status: DC | PRN

## 2015-05-07 MED ORDER — CITALOPRAM HYDROBROMIDE 40 MG PO TABS
40.0000 mg | ORAL_TABLET | Freq: Every day | ORAL | Status: DC
  Administered 2015-05-08 – 2015-05-09 (×2): 40 mg via ORAL
  Filled 2015-05-07 (×2): qty 1

## 2015-05-07 MED ORDER — METOCLOPRAMIDE HCL 10 MG PO TABS: 5.0000 mg | ORAL_TABLET | Freq: Three times a day (TID) | ORAL | Status: DC | PRN

## 2015-05-07 MED ORDER — LACTATED RINGERS IV SOLN: INTRAVENOUS | Status: DC

## 2015-05-07 MED ORDER — MIDAZOLAM HCL 5 MG/5ML IJ SOLN
INTRAMUSCULAR | Status: DC | PRN
  Administered 2015-05-07: 2 mg via INTRAVENOUS

## 2015-05-07 MED ORDER — RIVAROXABAN 10 MG PO TABS
10.0000 mg | ORAL_TABLET | Freq: Every day | ORAL | Status: DC
  Administered 2015-05-08 – 2015-05-09 (×2): 10 mg via ORAL
  Filled 2015-05-07 (×3): qty 1

## 2015-05-07 MED ORDER — LIDOCAINE HCL (CARDIAC) 20 MG/ML IV SOLN
INTRAVENOUS | Status: AC
  Filled 2015-05-07: qty 5

## 2015-05-07 MED ORDER — ACETAMINOPHEN 10 MG/ML IV SOLN
INTRAVENOUS | Status: AC
  Filled 2015-05-07: qty 100

## 2015-05-07 MED ORDER — CLONAZEPAM 0.5 MG PO TABS
0.5000 mg | ORAL_TABLET | Freq: Every day | ORAL | Status: DC
  Administered 2015-05-07 – 2015-05-08 (×2): 0.5 mg via ORAL
  Filled 2015-05-07 (×2): qty 1

## 2015-05-07 MED ORDER — OXYCODONE HCL 5 MG PO TABS: 5.0000 mg | ORAL_TABLET | Freq: Once | ORAL | Status: DC | PRN

## 2015-05-07 MED ORDER — CEFAZOLIN SODIUM-DEXTROSE 2-4 GM/100ML-% IV SOLN
2.0000 g | Freq: Four times a day (QID) | INTRAVENOUS | Status: AC
  Administered 2015-05-07 (×2): 2 g via INTRAVENOUS
  Filled 2015-05-07 (×2): qty 100

## 2015-05-07 MED ORDER — METOCLOPRAMIDE HCL 5 MG/ML IJ SOLN: 5.0000 mg | Freq: Three times a day (TID) | INTRAMUSCULAR | Status: DC | PRN

## 2015-05-07 MED ORDER — DEXAMETHASONE SODIUM PHOSPHATE 10 MG/ML IJ SOLN
10.0000 mg | Freq: Once | INTRAMUSCULAR | Status: AC
  Administered 2015-05-07: 10 mg via INTRAVENOUS

## 2015-05-07 MED ORDER — BUPIVACAINE IN DEXTROSE 0.75-8.25 % IT SOLN
INTRATHECAL | Status: DC | PRN
  Administered 2015-05-07: 1.4 mL via INTRATHECAL

## 2015-05-07 MED ORDER — DEXAMETHASONE SODIUM PHOSPHATE 10 MG/ML IJ SOLN
10.0000 mg | Freq: Once | INTRAMUSCULAR | Status: AC
  Administered 2015-05-08: 10 mg via INTRAVENOUS
  Filled 2015-05-07: qty 1

## 2015-05-07 MED ORDER — HYDROMORPHONE HCL 2 MG PO TABS
2.0000 mg | ORAL_TABLET | ORAL | Status: DC | PRN
  Administered 2015-05-07: 4 mg via ORAL
  Administered 2015-05-07: 2 mg via ORAL
  Administered 2015-05-07 – 2015-05-09 (×9): 4 mg via ORAL
  Filled 2015-05-07 (×7): qty 2
  Filled 2015-05-07: qty 1
  Filled 2015-05-07 (×3): qty 2

## 2015-05-07 MED ORDER — ONDANSETRON HCL 4 MG/2ML IJ SOLN
INTRAMUSCULAR | Status: DC | PRN
  Administered 2015-05-07: 4 mg via INTRAVENOUS

## 2015-05-07 MED ORDER — CEFAZOLIN SODIUM-DEXTROSE 2-4 GM/100ML-% IV SOLN
INTRAVENOUS | Status: AC
  Filled 2015-05-07: qty 100

## 2015-05-07 MED ORDER — TRANEXAMIC ACID 1000 MG/10ML IV SOLN
1000.0000 mg | Freq: Once | INTRAVENOUS | Status: AC
  Administered 2015-05-07: 1000 mg via INTRAVENOUS
  Filled 2015-05-07: qty 10

## 2015-05-07 MED ORDER — SODIUM CHLORIDE 0.9 % IR SOLN
Status: DC | PRN
  Administered 2015-05-07: 1000 mL

## 2015-05-07 MED ORDER — SODIUM CHLORIDE 0.9 % IJ SOLN
INTRAMUSCULAR | Status: AC
  Filled 2015-05-07: qty 50

## 2015-05-07 MED ORDER — BISACODYL 10 MG RE SUPP: 10.0000 mg | Freq: Every day | RECTAL | Status: DC | PRN

## 2015-05-07 MED ORDER — METHOCARBAMOL 1000 MG/10ML IJ SOLN
500.0000 mg | Freq: Four times a day (QID) | INTRAVENOUS | Status: DC | PRN
  Administered 2015-05-07: 500 mg via INTRAVENOUS
  Filled 2015-05-07 (×2): qty 5

## 2015-05-07 MED ORDER — HYDROMORPHONE HCL 1 MG/ML IJ SOLN
INTRAMUSCULAR | Status: AC
  Filled 2015-05-07: qty 1

## 2015-05-07 MED ORDER — CEFAZOLIN SODIUM-DEXTROSE 2-4 GM/100ML-% IV SOLN
2.0000 g | INTRAVENOUS | Status: AC
  Administered 2015-05-07: 2 g via INTRAVENOUS
  Filled 2015-05-07: qty 100

## 2015-05-07 MED ORDER — PHENOL 1.4 % MT LIQD
1.0000 | OROMUCOSAL | Status: DC | PRN
  Filled 2015-05-07: qty 177

## 2015-05-07 MED ORDER — DIPHENHYDRAMINE HCL 12.5 MG/5ML PO ELIX
12.5000 mg | ORAL_SOLUTION | ORAL | Status: DC | PRN
  Administered 2015-05-07: 25 mg via ORAL
  Filled 2015-05-07: qty 10

## 2015-05-07 MED ORDER — SODIUM CHLORIDE 0.9 % IV SOLN
INTRAVENOUS | Status: DC
  Administered 2015-05-07 – 2015-05-08 (×2): via INTRAVENOUS

## 2015-05-07 MED ORDER — OXYCODONE HCL 5 MG/5ML PO SOLN
5.0000 mg | Freq: Once | ORAL | Status: DC | PRN
  Filled 2015-05-07: qty 5

## 2015-05-07 MED ORDER — TRANEXAMIC ACID 1000 MG/10ML IV SOLN
1000.0000 mg | INTRAVENOUS | Status: AC
  Administered 2015-05-07: 1000 mg via INTRAVENOUS
  Filled 2015-05-07: qty 10

## 2015-05-07 MED ORDER — BUPIVACAINE HCL 0.25 % IJ SOLN
INTRAMUSCULAR | Status: DC | PRN
  Administered 2015-05-07: 20 mL

## 2015-05-07 MED ORDER — HYDROMORPHONE HCL 1 MG/ML IJ SOLN
0.5000 mg | INTRAMUSCULAR | Status: DC | PRN
  Administered 2015-05-07 – 2015-05-08 (×4): 0.5 mg via INTRAVENOUS
  Filled 2015-05-07 (×4): qty 1

## 2015-05-07 MED ORDER — HYDROMORPHONE HCL 1 MG/ML IJ SOLN
0.2500 mg | INTRAMUSCULAR | Status: DC | PRN
  Administered 2015-05-07 (×2): 0.5 mg via INTRAVENOUS

## 2015-05-07 MED ORDER — FENTANYL CITRATE (PF) 100 MCG/2ML IJ SOLN
INTRAMUSCULAR | Status: AC
  Filled 2015-05-07: qty 2

## 2015-05-07 MED ORDER — BUPIVACAINE LIPOSOME 1.3 % IJ SUSP
INTRAMUSCULAR | Status: DC | PRN
  Administered 2015-05-07: 20 mL

## 2015-05-07 MED ORDER — SODIUM CHLORIDE 0.9 % IV SOLN
INTRAVENOUS | Status: DC
  Administered 2015-05-07: 11:00:00 via INTRAVENOUS

## 2015-05-07 MED ORDER — ONDANSETRON HCL 4 MG/2ML IJ SOLN: 4.0000 mg | Freq: Four times a day (QID) | INTRAMUSCULAR | Status: DC | PRN

## 2015-05-07 MED ORDER — MIDAZOLAM HCL 2 MG/2ML IJ SOLN
INTRAMUSCULAR | Status: AC
  Filled 2015-05-07: qty 2

## 2015-05-07 MED ORDER — LACTATED RINGERS IV SOLN
INTRAVENOUS | Status: DC | PRN
  Administered 2015-05-07 (×2): via INTRAVENOUS

## 2015-05-07 MED ORDER — BUPIVACAINE LIPOSOME 1.3 % IJ SUSP
20.0000 mL | Freq: Once | INTRAMUSCULAR | Status: DC
  Filled 2015-05-07: qty 20

## 2015-05-07 MED ORDER — DEXAMETHASONE SODIUM PHOSPHATE 10 MG/ML IJ SOLN
INTRAMUSCULAR | Status: AC
  Filled 2015-05-07: qty 1

## 2015-05-07 MED ORDER — LAMOTRIGINE 100 MG PO TABS
100.0000 mg | ORAL_TABLET | Freq: Two times a day (BID) | ORAL | Status: DC
  Administered 2015-05-07 – 2015-05-09 (×4): 100 mg via ORAL
  Filled 2015-05-07 (×5): qty 1

## 2015-05-07 MED ORDER — STERILE WATER FOR IRRIGATION IR SOLN
Status: DC | PRN
  Administered 2015-05-07: 2000 mL

## 2015-05-07 MED ORDER — POLYETHYLENE GLYCOL 3350 17 G PO PACK: 17.0000 g | PACK | Freq: Every day | ORAL | Status: DC | PRN

## 2015-05-07 MED ORDER — GABAPENTIN 300 MG PO CAPS
600.0000 mg | ORAL_CAPSULE | Freq: Three times a day (TID) | ORAL | Status: DC
  Administered 2015-05-07 – 2015-05-09 (×6): 600 mg via ORAL
  Filled 2015-05-07 (×8): qty 2

## 2015-05-07 MED ORDER — CHLORHEXIDINE GLUCONATE 4 % EX LIQD: 60.0000 mL | Freq: Once | CUTANEOUS | Status: DC

## 2015-05-07 MED ORDER — SODIUM CHLORIDE 0.9 % IJ SOLN
INTRAMUSCULAR | Status: DC | PRN
  Administered 2015-05-07: 30 mL

## 2015-05-07 SURGICAL SUPPLY — 51 items
BAG ZIPLOCK 12X15 (MISCELLANEOUS) ×2 IMPLANT
BANDAGE ACE 6X5 VEL STRL LF (GAUZE/BANDAGES/DRESSINGS) ×2 IMPLANT
BLADE SAG 18X100X1.27 (BLADE) ×2 IMPLANT
BLADE SAW SGTL 11.0X1.19X90.0M (BLADE) ×2 IMPLANT
BOWL SMART MIX CTS (DISPOSABLE) ×2 IMPLANT
CAPT KNEE TOTAL 3 ATTUNE ×2 IMPLANT
CEMENT HV SMART SET (Cement) ×4 IMPLANT
CLOTH BEACON ORANGE TIMEOUT ST (SAFETY) ×2 IMPLANT
CUFF TOURN SGL QUICK 34 (TOURNIQUET CUFF) ×1
CUFF TRNQT CYL 34X4X40X1 (TOURNIQUET CUFF) ×1 IMPLANT
DECANTER SPIKE VIAL GLASS SM (MISCELLANEOUS) ×2 IMPLANT
DRAPE U-SHAPE 47X51 STRL (DRAPES) ×2 IMPLANT
DRSG ADAPTIC 3X8 NADH LF (GAUZE/BANDAGES/DRESSINGS) ×2 IMPLANT
DRSG PAD ABDOMINAL 8X10 ST (GAUZE/BANDAGES/DRESSINGS) ×2 IMPLANT
DURAPREP 26ML APPLICATOR (WOUND CARE) ×2 IMPLANT
ELECT REM PT RETURN 9FT ADLT (ELECTROSURGICAL) ×2
ELECTRODE REM PT RTRN 9FT ADLT (ELECTROSURGICAL) ×1 IMPLANT
EVACUATOR 1/8 PVC DRAIN (DRAIN) ×2 IMPLANT
GAUZE SPONGE 4X4 12PLY STRL (GAUZE/BANDAGES/DRESSINGS) ×2 IMPLANT
GLOVE BIOGEL PI IND STRL 6.5 (GLOVE) ×2 IMPLANT
GLOVE BIOGEL PI IND STRL 7.0 (GLOVE) ×1 IMPLANT
GLOVE BIOGEL PI IND STRL 7.5 (GLOVE) ×3 IMPLANT
GLOVE BIOGEL PI IND STRL 8 (GLOVE) ×1 IMPLANT
GLOVE BIOGEL PI INDICATOR 6.5 (GLOVE) ×2
GLOVE BIOGEL PI INDICATOR 7.0 (GLOVE) ×1
GLOVE BIOGEL PI INDICATOR 7.5 (GLOVE) ×3
GLOVE BIOGEL PI INDICATOR 8 (GLOVE) ×1
GLOVE SURG SS PI 6.5 STRL IVOR (GLOVE) ×4 IMPLANT
GLOVE SURG SS PI 7.5 STRL IVOR (GLOVE) ×4 IMPLANT
GLOVE SURG SS PI 8.0 STRL IVOR (GLOVE) ×2 IMPLANT
GOWN STRL REUS W/ TWL LRG LVL3 (GOWN DISPOSABLE) ×2 IMPLANT
GOWN STRL REUS W/TWL LRG LVL3 (GOWN DISPOSABLE) ×4 IMPLANT
GOWN STRL REUS W/TWL XL LVL3 (GOWN DISPOSABLE) ×4 IMPLANT
HANDPIECE INTERPULSE COAX TIP (DISPOSABLE) ×1
IMMOBILIZER KNEE 20 (SOFTGOODS) ×2
IMMOBILIZER KNEE 20 THIGH 36 (SOFTGOODS) ×1 IMPLANT
MANIFOLD NEPTUNE II (INSTRUMENTS) ×2 IMPLANT
PACK TOTAL KNEE CUSTOM (KITS) ×2 IMPLANT
PAD ABD 8X10 STRL (GAUZE/BANDAGES/DRESSINGS) ×2 IMPLANT
PADDING CAST COTTON 6X4 STRL (CAST SUPPLIES) ×4 IMPLANT
POSITIONER SURGICAL ARM (MISCELLANEOUS) ×2 IMPLANT
SET HNDPC FAN SPRY TIP SCT (DISPOSABLE) ×1 IMPLANT
STRIP CLOSURE SKIN 1/2X4 (GAUZE/BANDAGES/DRESSINGS) ×4 IMPLANT
SUT MNCRL AB 4-0 PS2 18 (SUTURE) ×2 IMPLANT
SUT VIC AB 2-0 CT1 27 (SUTURE) ×3
SUT VIC AB 2-0 CT1 TAPERPNT 27 (SUTURE) ×3 IMPLANT
SUT VLOC 180 0 24IN GS25 (SUTURE) ×2 IMPLANT
SYR 50ML LL SCALE MARK (SYRINGE) ×2 IMPLANT
TRAY FOLEY CATH SILVER 16FR LF (SET/KITS/TRAYS/PACK) ×2 IMPLANT
WRAP KNEE MAXI GEL POST OP (GAUZE/BANDAGES/DRESSINGS) ×2 IMPLANT
YANKAUER SUCT BULB TIP 10FT TU (MISCELLANEOUS) ×2 IMPLANT

## 2015-05-07 NOTE — Anesthesia Procedure Notes (Signed)
Spinal Patient location during procedure: OR End time: 05/07/2015 8:03 AM Staffing Resident/CRNA: Noralyn Pick D Performed by: anesthesiologist and resident/CRNA  Preanesthetic Checklist Completed: patient identified, site marked, surgical consent, pre-op evaluation, timeout performed, IV checked, risks and benefits discussed and monitors and equipment checked Spinal Block Patient position: sitting Prep: Betadine Patient monitoring: heart rate, continuous pulse ox and blood pressure Approach: midline Location: L3-4 Injection technique: single-shot Needle Needle type: Sprotte  Needle gauge: 24 G Needle length: 9 cm Assessment Sensory level: T6 Additional Notes Expiration date of kit checked and confirmed. Patient tolerated procedure well, without complications.

## 2015-05-07 NOTE — Transfer of Care (Signed)
Immediate Anesthesia Transfer of Care Note  Patient: [name]  Procedure(s) Performed: Procedure(s): TOTAL LEFT KNEE ARTHROPLASTY (Left)  Patient Location: PACU  Anesthesia Type:Spinal  Level of Consciousness: awake, alert  and oriented  Airway & Oxygen Therapy: Patient Spontanous Breathing and Patient connected to face mask oxygen  Post-op Assessment: Report given to RN and Post -op Vital signs reviewed and stable  Post vital signs: Reviewed and stable  Last Vitals:  Filed Vitals:   05/07/15 0549  BP: 118/87  Pulse: 81  Temp: 36.6 C  Resp: 16    Complications: No apparent anesthesia complications

## 2015-05-07 NOTE — Op Note (Signed)
Pre-operative diagnosis- Osteoarthritis  Left knee(s)  Post-operative diagnosis- Osteoarthritis Left knee(s)  Procedure-  Left  Total Knee Arthroplasty  Surgeon- Dione Plover. Latoria Dry, MD  Assistant- Arlee Muslim, PA-C   Anesthesia-  Spinal  EBL-* No blood loss amount entered *   Drains Hemovac  Tourniquet time-  Total Tourniquet Time Documented: Thigh (Left) - 44 minutes Total: Thigh (Left) - 44 minutes     Complications- None  Condition-PACU - hemodynamically stable.   Brief Clinical Note  [name] is a 50 y.o. year old female with end stage OA of her left knee with progressively worsening pain and dysfunction. She has constant pain, with activity and at rest and significant functional deficits with difficulties even with ADLs. She has had extensive non-op management including analgesics, injections of cortisone and viscosupplements, and home exercise program, but remains in significant pain with significant dysfunction. Radiographs show bone on bone arthritis all 3 compartments. She presents now for left Total Knee Arthroplasty.    Procedure in detail---   The patient is brought into the operating room and positioned supine on the operating table. After successful administration of  Spinal,   a tourniquet is placed high on the  Left thigh(s) and the lower extremity is prepped and draped in the usual sterile fashion. Time out is performed by the operating team and then the  Left lower extremity is wrapped in Esmarch, knee flexed and the tourniquet inflated to 300 mmHg.       A midline incision is made with a ten blade through the subcutaneous tissue to the level of the extensor mechanism. A fresh blade is used to make a medial parapatellar arthrotomy. Soft tissue over the proximal medial tibia is subperiosteally elevated to the joint line with a knife and into the semimembranosus bursa with a Cobb elevator. Soft tissue over the proximal lateral tibia is elevated with attention  being paid to avoiding the patellar tendon on the tibial tubercle. The patella is everted, knee flexed 90 degrees and the ACL and PCL are removed. Findings are bone on bone all 3 compartments worst lateral and patellofemoral.        The drill is used to create a starting hole in the distal femur and the canal is thoroughly irrigated with sterile saline to remove the fatty contents. The 5 degree Left  valgus alignment guide is placed into the femoral canal and the distal femoral cutting block is pinned to remove 10 mm off the distal femur. Resection is made with an oscillating saw.      The tibia is subluxed forward and the menisci are removed. The extramedullary alignment guide is placed referencing proximally at the medial aspect of the tibial tubercle and distally along the second metatarsal axis and tibial crest. The block is pinned to remove 21mm off the more deficient lateral  side. Resection is made with an oscillating saw. Size 3is the most appropriate size for the tibia and the proximal tibia is prepared with the modular drill and keel punch for that size.      The femoral sizing guide is placed and size 4 is most appropriate. Rotation is marked off the epicondylar axis and confirmed by creating a rectangular flexion gap at 90 degrees. The size 4 cutting block is pinned in this rotation and the anterior, posterior and chamfer cuts are made with the oscillating saw. The intercondylar block is then placed and that cut is made.      Trial size 3 tibial component,  trial size 4 narrow posterior stabilized femur and a 6  mm posterior stabilized rotating platform insert trial is placed. Full extension is achieved with excellent varus/valgus and anterior/posterior balance throughout full range of motion. The patella is everted and thickness measured to be 22  mm. Free hand resection is taken to 12 mm, a 35 template is placed, lug holes are drilled, trial patella is placed, and it tracks normally. Osteophytes are  removed off the posterior femur with the trial in place. All trials are removed and the cut bone surfaces prepared with pulsatile lavage. Cement is mixed and once ready for implantation, the size 3 tibial implant, size  4 narrow posterior stabilized femoral component, and the size 35 patella are cemented in place and the patella is held with the clamp. The trial insert is placed and the knee held in full extension. The Exparel (20 ml mixed with 30 ml saline) and .25% Bupivicaine, are injected into the extensor mechanism, posterior capsule, medial and lateral gutters and subcutaneous tissues.  All extruded cement is removed and once the cement is hard the permanent 6 mm posterior stabilized rotating platform insert is placed into the tibial tray.      The wound is copiously irrigated with saline solution and the extensor mechanism closed over a hemovac drain with #1 V-loc suture. The tourniquet is released for a total tourniquet time of 44  minutes. Flexion against gravity is 140 degrees and the patella tracks normally. Subcutaneous tissue is closed with 2.0 vicryl and subcuticular with running 4.0 Monocryl. The incision is cleaned and dried and steri-strips and a bulky sterile dressing are applied. The limb is placed into a knee immobilizer and the patient is awakened and transported to recovery in stable condition.      Please note that a surgical assistant was a medical necessity for this procedure in order to perform it in a safe and expeditious manner. Surgical assistant was necessary to retract the ligaments and vital neurovascular structures to prevent injury to them and also necessary for proper positioning of the limb to allow for anatomic placement of the prosthesis.   Dione Plover Zachary Lovins, MD    05/07/2015, 9:12 AM

## 2015-05-07 NOTE — Anesthesia Preprocedure Evaluation (Signed)
Anesthesia Evaluation  Patient identified by MRN, date of birth, ID band Patient awake    Reviewed: Allergy & Precautions, H&P , NPO status , Patient's Chart, lab work & pertinent test results  History of Anesthesia Complications Negative for: history of anesthetic complications  Airway Mallampati: I  TM Distance: >3 FB Neck ROM: Full    Dental  (+) Edentulous Upper, Edentulous Lower   Pulmonary neg pulmonary ROS,    Pulmonary exam normal breath sounds clear to auscultation       Cardiovascular negative cardio ROS Normal cardiovascular exam Rhythm:Regular Rate:Normal     Neuro/Psych  Headaches, Seizures -, Well Controlled,  Depression    GI/Hepatic negative GI ROS, Fatty liver disease   Endo/Other  negative endocrine ROS  Renal/GU negative Renal ROS  negative genitourinary   Musculoskeletal negative musculoskeletal ROS (+) Arthritis ,   Abdominal   Peds negative pediatric ROS (+)  Hematology negative hematology ROS (+)   Anesthesia Other Findings   Reproductive/Obstetrics negative OB ROS                             Anesthesia Physical  Anesthesia Plan  ASA: III  Anesthesia Plan: Spinal   Post-op Pain Management:    Induction: Intravenous  Airway Management Planned: Simple Face Mask  Additional Equipment:   Intra-op Plan:   Post-operative Plan:   Informed Consent: I have reviewed the patients History and Physical, chart, labs and discussed the procedure including the risks, benefits and alternatives for the proposed anesthesia with the patient or authorized representative who has indicated his/her understanding and acceptance.     Plan Discussed with: CRNA, Anesthesiologist and Surgeon  Anesthesia Plan Comments:         Anesthesia Quick Evaluation

## 2015-05-07 NOTE — Evaluation (Signed)
Physical Therapy Evaluation Patient Details Name: [name] MRN: [id number] DOB: 04/17/1965 Today's Date: 05/07/2015   History of Present Illness  L TKA, h/o epilepsy  Clinical Impression  Pt is s/p TKA resulting in the deficits listed below (see PT Problem List). Pt ambulated 20' with RW, performed TKA exercises with min A. Good progress expected.  Pt will benefit from skilled PT to increase their independence and safety with mobility to allow discharge to the venue listed below.      Follow Up Recommendations Home health PT    Equipment Recommendations  None recommended by PT    Recommendations for Other Services       Precautions / Restrictions Precautions Precautions: Knee Restrictions Weight Bearing Restrictions: No Other Position/Activity Restrictions: wbat      Mobility  Bed Mobility Overal bed mobility: Needs Assistance Bed Mobility: Supine to Sit     Supine to sit: Min assist     General bed mobility comments: min A for LLE, instructed pt to self assist with RLE  Transfers Overall transfer level: Needs assistance Equipment used: Rolling walker (2 wheeled) Transfers: Sit to/from Stand Sit to Stand: Min assist         General transfer comment: verbal cues hand placement, min A to rise and control descent  Ambulation/Gait   Ambulation Distance (Feet): 20 Feet Assistive device: Rolling walker (2 wheeled) Gait Pattern/deviations: Step-to pattern;Antalgic;Decreased step length - left;Decreased weight shift to left   Gait velocity interpretation: Below normal speed for age/gender General Gait Details: verbal cues sequencing and positioning in RW  Stairs            Wheelchair Mobility    Modified Rankin (Stroke Patients Only)       Balance Overall balance assessment: Modified Independent                                           Pertinent Vitals/Pain Pain Assessment: 0-10 Pain Score: 8  Pain Location: L knee     Home Living Family/patient expects to be discharged to:: Private residence Living Arrangements: Spouse/significant other Available Help at Discharge: Family;Available 24 hours/day Type of Home: House Home Access: Stairs to enter Entrance Stairs-Rails: Right;Left;Can reach both Entrance Stairs-Number of Steps: 3 Home Layout: One level Home Equipment: Walker - 2 wheels;Cane - quad;Cane - single point;Bedside commode      Prior Function Level of Independence: Independent with assistive device(s)         Comments: with cane     Hand Dominance   Dominant Hand: Right    Extremity/Trunk Assessment               Lower Extremity Assessment: LLE deficits/detail   LLE Deficits / Details: SLR 3/5, knee AAROM 0-40*, ankle WNL  Cervical / Trunk Assessment: Normal  Communication   Communication: No difficulties  Cognition Arousal/Alertness: Awake/alert Behavior During Therapy: WFL for tasks assessed/performed Overall Cognitive Status: Within Functional Limits for tasks assessed                      General Comments      Exercises Total Joint Exercises Ankle Circles/Pumps: AROM;Both;10 reps Quad Sets: AROM;Left;10 reps Heel Slides: AAROM;Left;10 reps;Supine Goniometric ROM: 0-40* L knee AAROM       Assessment/Plan    PT Assessment Patient needs continued PT services  PT Diagnosis Difficulty walking;Acute pain  PT Problem List Decreased strength;Decreased range of motion;Decreased activity tolerance;Decreased knowledge of use of DME;Decreased mobility;Pain  PT Treatment Interventions DME instruction;Gait training;Stair training;Functional mobility training;Therapeutic activities;Patient/family education;Therapeutic exercise   PT Goals (Current goals can be found in the Care Plan section) Acute Rehab PT Goals Patient Stated Goal: to be able to walk PT Goal Formulation: With patient Time For Goal Achievement: 05/14/15 Potential to Achieve Goals:  Good    Frequency 7X/week   Barriers to discharge        Co-evaluation               End of Session Equipment Utilized During Treatment: Gait belt Activity Tolerance: Patient tolerated treatment well Patient left: in chair;with call bell/phone within reach;with chair alarm set Nurse Communication: Mobility status         Time: [id number] PT Time Calculation (min) (ACUTE ONLY): 21 min   Charges:   PT Evaluation $PT Eval Low Complexity: 1 Procedure     PT G CodesPhilomena Doheny 05/07/2015, 5:26 PM [phone]

## 2015-05-07 NOTE — Interval H&P Note (Signed)
History and Physical Interval Note:  05/07/2015 6:48 AM  [name]  has presented today for surgery, with the diagnosis of left knee oa   The various methods of treatment have been discussed with the patient and family. After consideration of risks, benefits and other options for treatment, the patient has consented to  Procedure(s): TOTAL LEFT KNEE ARTHROPLASTY (Left) as a surgical intervention .  The patient's history has been reviewed, patient examined, no change in status, stable for surgery.  I have reviewed the patient's chart and labs.  Questions were answered to the patient's satisfaction.     Gearlean Alf

## 2015-05-07 NOTE — H&P (View-Only) (Signed)
[name] DOB: 1965-04-15 Married / Language: English / Race: White Female Date of Admission:  05/07/2015 CC:  Left Knee Pain History of Present Illness  The patient is a 50 year old female who comes in for a preoperative History and Physical. The patient is scheduled for a left total knee arthroplasty to be performed by Dr. Dione Plover. Aluisio, MD at [facility] on 05-07-2015. The patient is a 50 year old female who present for follow up of their knee. The patient is being followed for their bilateral knee pain and osteoarthritis. They are now out from bilateral knee cortisone injections which only lasted for about 1 week. Symptoms reported include: pain (w/ buckling:causing pt. to fall), aching, catching, popping, grinding, giving way and difficulty ambulating. The patient feels that they are doing poorly and report their pain level to be moderate to severe. The patient has not gotten any relief of their symptoms with Cortisone injections or viscosupplementation. She states she was hospitalized in the recent past for seizures and was told she cannot take any more pain medicine. Unfortunately, her left knee is progressively worse to the point where she could barely walk on it now. She states it is hurting at all times. It is limiting which she can and cannot do. It gives out on her. She has documented bone-on-bone lateral patellofemoral arthritis.  She has been cleared by her neurologist to proceed with surgery. Dr. Roddie Mc stated that she needed her Keppra dose given to her the day of surgery IV. The PO to IV dose is 1:1 ratio. They have been treated conservatively in the past for the above stated problem and despite conservative measures, they continue to have progressive pain and severe functional limitations and dysfunction. They have failed non-operative management including home exercise, medications, and injections. It is felt that they would benefit from undergoing total joint  replacement. Risks and benefits of the procedure have been discussed with the patient and they elect to proceed with surgery. There are no active contraindications to surgery such as ongoing infection or rapidly progressive neurological disease with the exception of her seizures that has recently been evaluated by Dr. Brett Fairy.  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 PENICILLIN 05/27/1998 Swelling. Patient is able to take Keflex without difficulty. VICODIN 05/27/1998 Itching. DARVOCET 02/06/2005 Skin Rashes. Benadryl *ANTIHISTAMINES*  "Makes me hyper"  Family History  Bleeding disorder  brother Cancer  mother, grandmother mothers side, grandfather mothers side and grandmother fathers side Depression  father Diabetes Mellitus  brother, grandmother mothers side and grandfather mothers side Drug / Alcohol Addiction  father Heart disease in female family member before age 50  Kidney disease  father Severe allergy  father  Social History Alcohol use  never consumed alcohol Tobacco use  never smoker Children  2 Current work status  disabled Drug/Alcohol Rehab (Currently)  no Drug/Alcohol Rehab (Previously)  yes Exercise  Exercises rarely Illicit drug use  yes Living situation  live with spouse Marital status  married Number of flights of stairs before winded  1 Pain Contract  no Tobacco / smoke exposure  yes outdoors only Post-Surgical  Plans  Home  Medication History CeleXA (20MG  Tablet, Oral) Active. ClonazePAM (0.5MG  Tablet, Oral)  Active. LamoTRIgine (100MG  Tablet, Oral) Active. Pramipexole Dihydrochloride (0.5MG  Tablet, Oral) Active. Keppra (500MG  Tablet, 2 Oral Twice a day) Active. Gabapentin (600MG  Tablet, Oral) Active.  Past Surgical History Arthroscopy of Knee  left Arthroscopy of Shoulder  right Cesarean Delivery  2 times Colon Polyp Removal - Colonoscopy  Foot Surgery  bilateral Hysterectomy  partial (non-cancerous) Total Hip Replacement  left Tubal Ligation     Review of Systems  General Present- Fatigue. Not Present- Chills, Fever, Memory Loss, Night Sweats, Weight Gain and Weight Loss. Skin Not Present- Eczema, Hives, Itching, Lesions and Rash. HEENT Not Present- Dentures, Double Vision, Headache, Hearing Loss, Tinnitus and Visual Loss. Respiratory Not Present- Allergies, Chronic Cough, Coughing up blood, Shortness of breath at rest and Shortness of breath with exertion. Cardiovascular Not Present- Chest Pain, Difficulty Breathing Lying Down, Murmur, Palpitations, Racing/skipping heartbeats and Swelling. Gastrointestinal Present- Constipation. Not Present- Abdominal Pain, Bloody Stool, Diarrhea, Difficulty Swallowing, Heartburn, Jaundice, Loss of appetitie, Nausea and Vomiting. Female Genitourinary Not Present- Blood in Urine, Discharge, Flank Pain, Incontinence, Painful Urination, Urgency, Urinary frequency, Urinary Retention, Urinating at Night and Weak urinary stream. Musculoskeletal Present- Joint Pain, Joint Swelling, Morning Stiffness, Muscle Pain and Muscle Weakness. Not Present- Back Pain and Spasms. Neurological Not Present- Blackout spells, Difficulty with balance, Dizziness, Paralysis, Tremor and Weakness. Psychiatric Present- Insomnia.  Vitals  Weight: 173 lb Height: 60in Body Surface Area: 1.76 m Body Mass Index: 33.79 kg/m  Pulse: 88 (Regular)  BP: 112/70 (Sitting, Right Arm, Standard)  Physical Exam  General Mental Status -Alert, cooperative and good  historian. General Appearance-pleasant, Not in acute distress. Orientation-Oriented X3. Build & Nutrition-Well nourished and Well developed.  Head and Neck Head-normocephalic, atraumatic . Neck Global Assessment - supple, no bruit auscultated on the right, no bruit auscultated on the left.  Eye Vision-Wears corrective lenses. Pupil - Bilateral-Regular and Round. Motion - Bilateral-EOMI.  Chest and Lung Exam Auscultation Breath sounds - clear at anterior chest wall and clear at posterior chest wall. Adventitious sounds - No Adventitious sounds.  Cardiovascular Auscultation Rhythm - Regular rate and rhythm. Heart Sounds - S1 WNL and S2 WNL. Murmurs & Other Heart Sounds - Auscultation of the heart reveals - No Murmurs.  Abdomen Palpation/Percussion Tenderness - Abdomen is non-tender to palpation. Rigidity (guarding) - Abdomen is soft. Auscultation Auscultation of the abdomen reveals - Bowel sounds normal.  Female Genitourinary Note: Not done, not pertinent to present illness   Musculoskeletal Note: She is in no distress. Her left knee shows no effusion. Range is about 5 to 120. There is moderate crepitus on range of motion and significant tenderness, lateral greater than medial and no instability noted.  RADIOGRAPHS Her radiographs do show bone-on-bone arthritis, lateral patellofemoral left knee as well as bone-on-bone medial patellofemoral right knee.   Assessment & Plan Primary osteoarthritis of right knee (M17.11) Primary osteoarthritis of left knee (M17.12)  Note:Surgical Plans: Left Total Knee Replacement  Disposition: Home  PCP: Dr. Laurance Flatten - Patient has been seen preoperatively and felt to be stable for surgery. Neuro: Dr. Brett Fairy - Patient has been seen preoperatively and felt to be stable for surgery.  IV TXA  Anesthesia Issues: None **Please note that the pateint's neoruologist wants her to receive her Keppra dose the day of surgery  and states that it can be given IV. The PO to IV dose is 1:1.**  Signed electronically by Ok Edwards, III PA-C

## 2015-05-08 ENCOUNTER — Encounter (HOSPITAL_COMMUNITY): Payer: Self-pay | Admitting: Orthopedic Surgery

## 2015-05-08 LAB — BASIC METABOLIC PANEL
Anion gap: 12 (ref 5–15)
BUN: 7 mg/dL (ref 6–20)
CO2: 24 mmol/L (ref 22–32)
Calcium: 8.9 mg/dL (ref 8.9–10.3)
Chloride: 102 mmol/L (ref 101–111)
Creatinine, Ser: 0.58 mg/dL (ref 0.44–1.00)
GFR calc Af Amer: >60 mL/min (ref >60)
GFR calc non Af Amer: >60 mL/min (ref >60)
Glucose, Bld: 115 mg/dL — ABNORMAL HIGH (ref 65–99)
Potassium: 4 mmol/L (ref 3.5–5.1)
Sodium: 138 mmol/L (ref 135–145)

## 2015-05-08 LAB — CBC
HCT: 34.7 % — ABNORMAL LOW (ref 36.0–46.0)
Hemoglobin: 11.6 g/dL — ABNORMAL LOW (ref 12.0–15.0)
MCH: 29.6 pg (ref 26.0–34.0)
MCHC: 33.4 g/dL (ref 30.0–36.0)
MCV: 88.5 fL (ref 78.0–100.0)
Platelets: 302 10*3/uL (ref 150–400)
RBC: 3.92 MIL/uL (ref 3.87–5.11)
RDW: 13 % (ref 11.5–15.5)
WBC: 17 10*3/uL — ABNORMAL HIGH (ref 4.0–10.5)

## 2015-05-08 NOTE — Evaluation (Signed)
Occupational Therapy Evaluation Patient Details Name: [name] MRN: [id number] DOB: December 30, 1965 Today's Date: 05/08/2015    History of Present Illness L TKA, h/o epilepsy   Clinical Impression   Patient presenting with decreased ADL and functional mobility independence secondary to above. Patient independent PTA. Patient currently functioning at an overall min to mod assist level. Patient will benefit from acute OT to increase overall independence in the areas of ADLs, functional mobility, and overall safety in order to safely discharge home with assistance from family prn.    After bed mobility and while seated EOB, pt with complaints of dizziness and needed to lay back down. Once in supine BP=133/91. Pt laid in supine ~5 minutes before dizziness subsided. Pt then sat EOB and BP=130/85. Pt then engaged in functional ambulation/mobility in room and with no more complaints of dizziness.    Follow Up Recommendations  No OT follow up;Supervision - Intermittent    Equipment Recommendations  None recommended by OT    Recommendations for Other Services  None at this time   Precautions / Restrictions Precautions Precautions: Knee Precaution Comments: reviewed knee precautions and no pillow under knee  Required Braces or Orthoses: Knee Immobilizer - Left Knee Immobilizer - Left: On when out of bed or walking;Discontinue once straight leg raise with < 10 degree lag Restrictions Weight Bearing Restrictions: Yes LLE Weight Bearing: Weight bearing as tolerated    Mobility Bed Mobility Overal bed mobility: Needs Assistance Bed Mobility: Supine to Sit;Sit to Supine     Supine to sit: Min assist Sit to supine: Mod assist   General bed mobility comments: With HOB flat, pt able to perform supine to sit using bed rails minimally. While seated EOB, pt with complaints of dizziness and needed to lay back down, requiring mod assist for BLE management back to supine position.    Transfers Overall transfer level: Needs assistance Equipment used: Rolling walker (2 wheeled) Transfers: Sit to/from Stand Sit to Stand: Min guard;Min assist General transfer comment: verbal cues hand placement, min A to rise and control descent    Balance Overall balance assessment: Needs assistance Sitting-balance support: No upper extremity supported;Feet supported Sitting balance-Leahy Scale: Good     Standing balance support: During functional activity;No upper extremity supported Standing balance-Leahy Scale: Good Standing balance comment: Pt able to stand at sink for grooming tasks with no UE support. During functional ambulation, pt relying on RW for safety and balance.     ADL Overall ADL's : Needs assistance/impaired Eating/Feeding: Set up;Sitting   Grooming: Set up;Sitting   Upper Body Bathing: Set up;Sitting   Lower Body Bathing: Sit to/from stand;Minimal assistance   Upper Body Dressing : Set up;Sitting Upper Body Dressing Details (indicate cue type and reason): assistance needed by RN secondary to IV Lower Body Dressing: Moderate assistance;Sit to/from stand   Toilet Transfer: Designer, television/film set Details (indicate cue type and reason): simulated  Toileting- Clothing Manipulation and Hygiene: Minimal assistance;Sit to/from stand Toileting - Clothing Manipulation Details (indicate cue type and reason): simulated    Tub/Shower Transfer Details (indicate cue type and reason): did not occur   General ADL Comments: Discussed compensatory strategies to increase independence with LB ADLs. Discussed safe and effective tub/shower transfers using tub transfer bench, pt states her husband has one at home. Pt states the bench is too high for her, educated pt on how she can lower the legs of the bench to fit her size prn.    Vision Vision Assessment?: No apparent visual  deficits          Pertinent Vitals/Pain Pain Assessment: 0-10 Pain Score: 8  Pain  Location: L knee  Pain Descriptors / Indicators: Sore;Guarding Pain Intervention(s): Monitored during session;Repositioned;Premedicated before session     Hand Dominance Right   Extremity/Trunk Assessment Upper Extremity Assessment Upper Extremity Assessment: Overall WFL for tasks assessed   Lower Extremity Assessment Lower Extremity Assessment: Defer to PT evaluation   Cervical / Trunk Assessment Cervical / Trunk Assessment: Normal   Communication Communication Communication: No difficulties   Cognition Arousal/Alertness: Awake/alert Behavior During Therapy: WFL for tasks assessed/performed Overall Cognitive Status: Within Functional Limits for tasks assessed              Home Living Family/patient expects to be discharged to:: Private residence Living Arrangements: Spouse/significant other Available Help at Discharge: Family;Available 24 hours/day Type of Home: House Home Access: Stairs to enter [employer] of Steps: 3 Entrance Stairs-Rails: Right;Left;Can reach both Home Layout: One level     Bathroom Shower/Tub: Tub/shower unit;Curtain Shower/tub characteristics: Architectural technologist: Standard Bathroom Accessibility: Yes   Home Equipment: Environmental consultant - 2 wheels;Cane - quad;Cane - single point;Bedside commode;Tub bench   Additional Comments: Pt reports tub transfer bench is husbands, discussed ways for her to lower bench so it could fit her prn       Prior Functioning/Environment Level of Independence: Independent with assistive device(s)  Comments: with cane    OT Diagnosis: Generalized weakness;Acute pain   OT Problem List: Decreased strength;Decreased range of motion;Decreased activity tolerance;Impaired balance (sitting and/or standing);Decreased safety awareness;Decreased knowledge of use of DME or AE;Decreased knowledge of precautions;Pain   OT Treatment/Interventions: Self-care/ADL training;Therapeutic exercise;Energy conservation;DME  and/or AE instruction;Therapeutic activities;Patient/family education;Balance training    OT Goals(Current goals can be found in the care plan section) Acute Rehab OT Goals Patient Stated Goal: get up out of bed  OT Goal Formulation: With patient Time For Goal Achievement: 05/15/15 Potential to Achieve Goals: Good ADL Goals Pt Will Perform Grooming: with modified independence;standing Pt Will Perform Lower Body Bathing: with modified independence;sit to/from stand;with adaptive equipment Pt Will Perform Lower Body Dressing: with modified independence;sit to/from stand;with adaptive equipment Pt Will Transfer to Toilet: with modified independence;ambulating;bedside commode Pt Will Perform Tub/Shower Transfer: Tub transfer;ambulating;rolling walker;tub bench;with modified independence  OT Frequency: Min 2X/week   Barriers to D/C: None known at this time    End of Session Equipment Utilized During Treatment: Gait belt;Rolling walker;Left knee immobilizer CPM Left Knee CPM Left Knee: Off Nurse Communication: Mobility status;Other (comment) (Pt seated in recliner performing bathing)  Activity Tolerance: Patient tolerated treatment well Patient left: in chair;with call bell/phone within reach;with chair alarm set   Time: [id number] OT Time Calculation (min): 27 min Charges:  OT General Charges $OT Visit: 1 Procedure OT Evaluation $OT Eval Low Complexity: 1 Procedure OT Treatments $Self Care/Home Management : 8-22 mins  Chrys Racer , MS, OTR/L, CLT Pager: [phone]  05/08/2015, 10:10 AM

## 2015-05-08 NOTE — Anesthesia Postprocedure Evaluation (Signed)
Anesthesia Post Note  Patient: [name]  Procedure(s) Performed: Procedure(s) (LRB): TOTAL LEFT KNEE ARTHROPLASTY (Left)  Patient location during evaluation: PACU Anesthesia Type: Spinal Level of consciousness: oriented and awake and alert Pain management: pain level controlled Vital Signs Assessment: post-procedure vital signs reviewed and stable Respiratory status: spontaneous breathing, respiratory function stable and patient connected to nasal cannula oxygen Cardiovascular status: blood pressure returned to baseline and stable Postop Assessment: no headache and no backache Anesthetic complications: no                  Zenaida Deed

## 2015-05-08 NOTE — Progress Notes (Signed)
Physical Therapy Treatment Patient Details Name: [name] MRN: [id number] DOB: [birth year] Today's Date: 05/08/2015    History of Present Illness L TKA, h/o epilepsy/Vertigo    PT Comments    POD # 1 am session Assisted OOB to amb to bathroom then back to bed to perform TKR TE's followed by ICE.   Follow Up Recommendations  Home health PT     Equipment Recommendations  None recommended by PT    Recommendations for Other Services       Precautions / Restrictions Precautions Precautions: Knee Precaution Comments: reviewed knee precautions and no pillow under knee  Required Braces or Orthoses: Knee Immobilizer - Left Knee Immobilizer - Left: On when out of bed or walking;Discontinue once straight leg raise with < 10 degree lag Restrictions Weight Bearing Restrictions: No LLE Weight Bearing: Weight bearing as tolerated    Mobility  Bed Mobility Overal bed mobility: Needs Assistance Bed Mobility: Supine to Sit;Sit to Supine     Supine to sit: Min assist Sit to supine: Min assist   General bed mobility comments: assist with L LE only and increased time  Transfers Overall transfer level: Needs assistance Equipment used: Rolling walker (2 wheeled) Transfers: Sit to/from Stand Sit to Stand: Min guard;Supervision         General transfer comment: verbal cues hand placement, min A to rise and control descent  Ambulation/Gait Ambulation/Gait assistance: Supervision;Min guard Ambulation Distance (Feet): 20 Feet (10 feet x 2 to and from bathroom) Assistive device: Rolling walker (2 wheeled) Gait Pattern/deviations: Step-to pattern;Decreased stance time - left     General Gait Details: verbal cues sequencing and positioning in RW plus increased time   Stairs            Wheelchair Mobility    Modified Rankin (Stroke Patients Only)       Balance Overall balance assessment: Needs assistance Sitting-balance support: No upper extremity supported;Feet  supported Sitting balance-Leahy Scale: Good     Standing balance support: During functional activity;No upper extremity supported Standing balance-Leahy Scale: Good Standing balance comment: Pt able to stand at sink for grooming tasks with no UE support. During functional ambulation, pt relying on RW for safety and balance.                     Cognition Arousal/Alertness: Awake/alert Behavior During Therapy: WFL for tasks assessed/performed Overall Cognitive Status: Within Functional Limits for tasks assessed                      Exercises   Total Knee Replacement TE's 10 reps B LE ankle pumps 10 reps towel squeezes 10 reps knee presses 10 reps heel slides  10 reps SAQ's 10 reps SLR's 10 reps ABD Followed by ICE     General Comments        Pertinent Vitals/Pain Pain Assessment: 0-10 Pain Score: 7  Pain Location: L knee during TE's Pain Descriptors / Indicators: Grimacing;Sore;Tender Pain Intervention(s): Monitored during session;Premedicated before session;Repositioned;Ice applied    Home Living Family/patient expects to be discharged to:: Private residence Living Arrangements: Spouse/significant other Available Help at Discharge: Family;Available 24 hours/day Type of Home: House Home Access: Stairs to enter Entrance Stairs-Rails: Right;Left;Can reach both Home Layout: One level Home Equipment: Environmental consultant - 2 wheels;Cane - quad;Cane - single point;Bedside commode;Tub bench Additional Comments: Pt reports tub transfer bench is husbands, discussed ways for her to lower bench so it could fit her prn  Prior Function Level of Independence: Independent with assistive device(s)      Comments: with cane   PT Goals (current goals can now be found in the care plan section) Acute Rehab PT Goals Patient Stated Goal: get up out of bed  Progress towards PT goals: Progressing toward goals    Frequency  7X/week    PT Plan Current plan remains appropriate     Co-evaluation             End of Session Equipment Utilized During Treatment: Gait belt Activity Tolerance: Patient tolerated treatment well Patient left: in bed;with call bell/phone within reach;with bed alarm set     Time: 1050-1120 PT Time Calculation (min) (ACUTE ONLY): 30 min  Charges:  $Gait Training: 8-22 mins $Therapeutic Exercise: 8-22 mins                    G Codes:      Rica Koyanagi  PTA [facility]  Acute  Rehab Pager      [phone]

## 2015-05-08 NOTE — Discharge Instructions (Addendum)
° °Dr. Frank Aluisio °Total Joint Specialist °[facility] °[address] °[place], [place] [address] °[phone] ° °TOTAL KNEE REPLACEMENT POSTOPERATIVE DIRECTIONS ° °Knee Rehabilitation, Guidelines Following Surgery  °Results after knee surgery are often greatly improved when you follow the exercise, range of motion and muscle strengthening exercises prescribed by your doctor. Safety measures are also important to protect the knee from further injury. Any time any of these exercises cause you to have increased pain or swelling in your knee joint, decrease the amount until you are comfortable again and slowly increase them. If you have problems or questions, call your caregiver or physical therapist for advice.  ° °HOME CARE INSTRUCTIONS  °Remove items at home which could result in a fall. This includes throw rugs or furniture in walking pathways.  °· ICE to the affected knee every three hours for 30 minutes at a time and then as needed for pain and swelling.  Continue to use ice on the knee for pain and swelling from surgery. You may notice swelling that will progress down to the foot and ankle.  This is normal after surgery.  Elevate the leg when you are not up walking on it.   °· Continue to use the breathing machine which will help keep your temperature down.  It is common for your temperature to cycle up and down following surgery, especially at night when you are not up moving around and exerting yourself.  The breathing machine keeps your lungs expanded and your temperature down. °· Do not place pillow under knee, focus on keeping the knee straight while resting ° °DIET °You may resume your previous home diet once your are discharged from the hospital. ° °DRESSING / WOUND CARE / SHOWERING °You may shower 3 days after surgery, but keep the wounds dry during showering.  You may use an occlusive plastic wrap (Press'n Seal for example), NO SOAKING/SUBMERGING IN THE BATHTUB.  If the  bandage gets wet, change with a clean dry gauze.  If the incision gets wet, pat the wound dry with a clean towel. °You may start showering once you are discharged home but do not submerge the incision under water. Just pat the incision dry and apply a dry gauze dressing on daily. °Change the surgical dressing daily and reapply a dry dressing each time. ° °ACTIVITY °Walk with your walker as instructed. °Use walker as long as suggested by your caregivers. °Avoid periods of inactivity such as sitting longer than an hour when not asleep. This helps prevent blood clots.  °You may resume a sexual relationship in one month or when given the OK by your doctor.  °You may return to work once you are cleared by your doctor.  °Do not drive a car for 6 weeks or until released by you surgeon.  °Do not drive while taking narcotics. ° °WEIGHT BEARING °Weight bearing as tolerated with assist device (walker, cane, etc) as directed, use it as long as suggested by your surgeon or therapist, typically at least 4-6 weeks. ° °POSTOPERATIVE CONSTIPATION PROTOCOL °Constipation - defined medically as fewer than three stools per week and severe constipation as less than one stool per week. ° °One of the most common issues patients have following surgery is constipation.  Even if you have a regular bowel pattern at home, your normal regimen is likely to be disrupted due to multiple reasons following surgery.  Combination of anesthesia, postoperative narcotics, change in appetite and fluid intake all can affect your bowels.    In order to avoid complications following surgery, here are some recommendations in order to help you during your recovery period. ° °Colace (docusate) - Pick up an over-the-counter form of Colace or another stool softener and take twice a day as long as you are requiring postoperative pain medications.  Take with a full glass of water daily.  If you experience loose stools or diarrhea, hold the colace until you stool forms  back up.  If your symptoms do not get better within 1 week or if they get worse, check with your doctor. ° °Dulcolax (bisacodyl) - Pick up over-the-counter and take as directed by the product packaging as needed to assist with the movement of your bowels.  Take with a full glass of water.  Use this product as needed if not relieved by Colace only.  ° °MiraLax (polyethylene glycol) - Pick up over-the-counter to have on hand.  MiraLax is a solution that will increase the amount of water in your bowels to assist with bowel movements.  Take as directed and can mix with a glass of water, juice, soda, coffee, or tea.  Take if you go more than two days without a movement. °Do not use MiraLax more than once per day. Call your doctor if you are still constipated or irregular after using this medication for 7 days in a row. ° °If you continue to have problems with postoperative constipation, please contact the office for further assistance and recommendations.  If you experience "the worst abdominal pain ever" or develop nausea or vomiting, please contact the office immediatly for further recommendations for treatment. ° °ITCHING ° If you experience itching with your medications, try taking only a single pain pill, or even half a pain pill at a time.  You can also use Benadryl over the counter for itching or also to help with sleep.  ° °TED HOSE STOCKINGS °Wear the elastic stockings on both legs for three weeks following surgery during the day but you may remove then at night for sleeping. ° °MEDICATIONS °See your medication summary on the “After Visit Summary” that the nursing staff will review with you prior to discharge.  You may have some home medications which will be placed on hold until you complete the course of blood thinner medication.  It is important for you to complete the blood thinner medication as prescribed by your surgeon.  Continue your approved medications as instructed at time of  discharge. ° °PRECAUTIONS °If you experience chest pain or shortness of breath - call 911 immediately for transfer to the hospital emergency department.  °If you develop a fever greater that 101 F, purulent drainage from wound, increased redness or drainage from wound, foul odor from the wound/dressing, or calf pain - CONTACT YOUR SURGEON.   °                                                °FOLLOW-UP APPOINTMENTS °Make sure you keep all of your appointments after your operation with your surgeon and caregivers. You should call the office at the above phone number and make an appointment for approximately two weeks after the date of your surgery or on the date instructed by your surgeon outlined in the "After Visit Summary". ° ° °RANGE OF MOTION AND STRENGTHENING EXERCISES  °Rehabilitation of the knee is important following a knee injury or   an operation. After just a few days of immobilization, the muscles of the thigh which control the knee become weakened and shrink (atrophy). Knee exercises are designed to build up the tone and strength of the thigh muscles and to improve knee motion. Often times heat used for twenty to thirty minutes before working out will loosen up your tissues and help with improving the range of motion but do not use heat for the first two weeks following surgery. These exercises can be done on a training (exercise) mat, on the floor, on a table or on a bed. Use what ever works the best and is most comfortable for you Knee exercises include:  °Leg Lifts - While your knee is still immobilized in a splint or cast, you can do straight leg raises. Lift the leg to 60 degrees, hold for 3 sec, and slowly lower the leg. Repeat 10-20 times 2-3 times daily. Perform this exercise against resistance later as your knee gets better.  °Quad and Hamstring Sets - Tighten up the muscle on the front of the thigh (Quad) and hold for 5-10 sec. Repeat this 10-20 times hourly. Hamstring sets are done by pushing the  foot backward against an object and holding for 5-10 sec. Repeat as with quad sets.  °· Leg Slides: Lying on your back, slowly slide your foot toward your buttocks, bending your knee up off the floor (only go as far as is comfortable). Then slowly slide your foot back down until your leg is flat on the floor again. °· Angel Wings: Lying on your back spread your legs to the side as far apart as you can without causing discomfort.  °A rehabilitation program following serious knee injuries can speed recovery and prevent re-injury in the future due to weakened muscles. Contact your doctor or a physical therapist for more information on knee rehabilitation.  ° °IF YOU ARE TRANSFERRED TO A SKILLED REHAB FACILITY °If the patient is transferred to a skilled rehab facility following release from the hospital, a list of the current medications will be sent to the facility for the patient to continue.  When discharged from the skilled rehab facility, please have the facility set up the patient's Home Health Physical Therapy prior to being released. Also, the skilled facility will be responsible for providing the patient with their medications at time of release from the facility to include their pain medication, the muscle relaxants, and their blood thinner medication. If the patient is still at the rehab facility at time of the two week follow up appointment, the skilled rehab facility will also need to assist the patient in arranging follow up appointment in our office and any transportation needs. ° °MAKE SURE YOU:  °Understand these instructions.  °Get help right away if you are not doing well or get worse.  ° ° °Pick up stool softner and laxative for home use following surgery while on pain medications. °Do not submerge incision under water. °Please use good hand washing techniques while changing dressing each day. °May shower starting three days after surgery. °Please use a clean towel to pat the incision dry following  showers. °Continue to use ice for pain and swelling after surgery. °Do not use any lotions or creams on the incision until instructed by your surgeon. ° °Take Xarelto for two and a half more weeks, then discontinue Xarelto. °Once the patient has completed the blood thinner regimen, then take a Baby 81 mg Aspirin daily for three more weeks. ° ° °Information   on my medicine - XARELTO® (Rivaroxaban) ° °This medication education was reviewed with me or my healthcare representative as part of my discharge preparation.  The pharmacist that spoke with me during my hospital stay was:  Glogovac,nikola, RPH ° °Why was Xarelto® prescribed for you? °Xarelto® was prescribed for you to reduce the risk of blood clots forming after orthopedic surgery. The medical term for these abnormal blood clots is venous thromboembolism (VTE). ° °What do you need to know about xarelto® ? °Take your Xarelto® ONCE DAILY at the same time every day. °You may take it either with or without food. ° °If you have difficulty swallowing the tablet whole, you may crush it and mix in applesauce just prior to taking your dose. ° °Take Xarelto® exactly as prescribed by your doctor and DO NOT stop taking Xarelto® without talking to the doctor who prescribed the medication.  Stopping without other VTE prevention medication to take the place of Xarelto® may increase your risk of developing a clot. ° °After discharge, you should have regular check-up appointments with your healthcare provider that is prescribing your Xarelto®.   ° °What do you do if you miss a dose? °If you miss a dose, take it as soon as you remember on the same day then continue your regularly scheduled once daily regimen the next day. Do not take two doses of Xarelto® on the same day.  ° °Important Safety Information °A possible side effect of Xarelto® is bleeding. You should call your healthcare provider right away if you experience any of the following: °? Bleeding from an injury or your  nose that does not stop. °? Unusual colored urine (red or dark brown) or unusual colored stools (red or black). °? Unusual bruising for unknown reasons. °? A serious fall or if you hit your head (even if there is no bleeding). ° °Some medicines may interact with Xarelto® and might increase your risk of bleeding while on Xarelto®. To help avoid this, consult your healthcare provider or pharmacist prior to using any new prescription or non-prescription medications, including herbals, vitamins, non-steroidal anti-inflammatory drugs (NSAIDs) and supplements. ° °This website has more information on Xarelto®: www.xarelto.com. ° ° °

## 2015-05-08 NOTE — Progress Notes (Signed)
Physical Therapy Treatment Patient Details Name: [name] MRN: [id number] DOB: 04-07-1965 Today's Date: 05/08/2015    History of Present Illness L TKA, h/o epilepsy/Vertigo    PT Comments    POD # 1 pm session Assisted OOB to amb a greater distance.  Slow but steady gait.  Issues of pain control.  Assisted back to bed for CPM.    Follow Up Recommendations  Home health PT     Equipment Recommendations  None recommended by PT    Recommendations for Other Services       Precautions / Restrictions Precautions Precautions: Knee Precaution Comments: reviewed knee precautions and no pillow under knee  Required Braces or Orthoses: Knee Immobilizer - Left Knee Immobilizer - Left: On when out of bed or walking;Discontinue once straight leg raise with < 10 degree lag Restrictions Weight Bearing Restrictions: No LLE Weight Bearing: Weight bearing as tolerated    Mobility  Bed Mobility Overal bed mobility: Needs Assistance Bed Mobility: Supine to Sit;Sit to Supine     Supine to sit: Min assist Sit to supine: Min assist   General bed mobility comments: assist with L LE only and increased time  Transfers Overall transfer level: Needs assistance Equipment used: Rolling walker (2 wheeled) Transfers: Sit to/from Stand Sit to Stand: Supervision         General transfer comment: verbal cues hand placement, min A to rise and control descent  Ambulation/Gait Ambulation/Gait assistance: Supervision;Min guard Ambulation Distance (Feet): 50 Feet Assistive device: Rolling walker (2 wheeled) Gait Pattern/deviations: Step-to pattern;Decreased stance time - left     General Gait Details: verbal cues sequencing and positioning in RW plus increased time   Stairs            Wheelchair Mobility    Modified Rankin (Stroke Patients Only)       Balance                                    Cognition Arousal/Alertness: Awake/alert Behavior During  Therapy: WFL for tasks assessed/performed Overall Cognitive Status: Within Functional Limits for tasks assessed                      Exercises      General Comments        Pertinent Vitals/Pain Pain Assessment: 0-10 Pain Score: 7  Pain Location: L knee during TE's Pain Descriptors / Indicators: Grimacing;Sore;Tender Pain Intervention(s): Monitored during session;Premedicated before session;Repositioned;Ice applied    Home Living                      Prior Function            PT Goals (current goals can now be found in the care plan section) Progress towards PT goals: Progressing toward goals    Frequency  7X/week    PT Plan Current plan remains appropriate    Co-evaluation             End of Session Equipment Utilized During Treatment: Gait belt Activity Tolerance: Patient tolerated treatment well Patient left: in bed;with call bell/phone within reach;with bed alarm set     Time: 1350-1410 PT Time Calculation (min) (ACUTE ONLY): 20 min  Charges:  $Gait Training: 8-22 mins                     G Codes:  Rica Koyanagi  PTA [facility]  Acute  Rehab Pager      [phone]

## 2015-05-08 NOTE — Progress Notes (Signed)
   Subjective: 1 Day Post-Op Procedure(s) (LRB): TOTAL LEFT KNEE ARTHROPLASTY (Left) Patient reports pain as moderate.   Patient seen in rounds with Dr. Wynelle Link. Patient is having problems with pain in the knee, requiring pain medications We will start therapy today.  Plan is to go Home after hospital stay.  Objective: Vital signs in last 24 hours: Temp:  [97.6 F (36.4 C)-98.4 F (36.9 C)] 98.1 F (36.7 C) (04/25 0452) Pulse Rate:  [70-93] 73 (04/25 0452) Resp:  [12-18] 17 (04/25 0452) BP: (98-146)/(66-97) 146/95 mmHg (04/25 0452) SpO2:  [95 %-100 %] 99 % (04/25 0452)  Intake/Output from previous day:  Intake/Output Summary (Last 24 hours) at 05/08/15 0831 Last data filed at 05/08/15 0620  Gross per 24 hour  Intake 5001.25 ml  Output   4670 ml  Net 331.25 ml    Intake/Output this shift: UOP 2400  Labs:  Recent Labs  05/08/15 0438  HGB 11.6*    Recent Labs  05/08/15 0438  WBC 17.0*  RBC 3.92  HCT 34.7*  PLT 302    Recent Labs  05/08/15 0438  NA 138  K 4.0  CL 102  CO2 24  BUN 7  CREATININE 0.58  GLUCOSE 115*  CALCIUM 8.9   No results for input(s): LABPT, INR in the last 72 hours.  EXAM General - Patient is Alert, Appropriate and Oriented Extremity - Neurovascular intact Sensation intact distally Dorsiflexion/Plantar flexion intact Dressing - dressing C/D/I Motor Function - intact, moving foot and toes well on exam.  Hemovac pulled without difficulty.  Past Medical History  Diagnosis Date  . Vertigo   . Constipation   . Restless leg   . High cholesterol   . Chronic bronchitis ([place])     "yearly; when the weather changes" (04/22/2012)  . [id number])     "1/wk" (04/22/2012)  . Depression   . Epilepsy ([place])     "been having them right often here lately" (04/22/2012)  . Seizures ([place])   . Arthritis     "knees" (04/22/2012)  . Osteoarthritis     [name] 04/22/2012  . Colon polyps     adenomatous and hyperplastic-  . Rectal bleed    in toilet- bright red  . Other convulsions 05/21/12    non-epileptic spells  . Bronchitis   . Fatty liver   . Chronic kidney disease   . Eczema     Assessment/Plan: 1 Day Post-Op Procedure(s) (LRB): TOTAL LEFT KNEE ARTHROPLASTY (Left) Principal Problem:   OA (osteoarthritis) of knee  Estimated body mass index is 34.96 kg/(m^2) as calculated from the following:   Height as of this encounter: 5' (1.524 m).   Weight as of this encounter: 81.194 kg (179 lb). Advance diet Up with therapy Plan for discharge tomorrow Discharge home with home health  DVT Prophylaxis - Xarelto Weight-Bearing as tolerated to left leg D/C O2 and Pulse OX and try on Room Air  Arlee Muslim, PA-C Orthopaedic Surgery 05/08/2015, 8:31 AM

## 2015-05-09 ENCOUNTER — Telehealth: Payer: Self-pay | Admitting: Adult Health

## 2015-05-09 ENCOUNTER — Telehealth: Payer: Self-pay | Admitting: *Deleted

## 2015-05-09 LAB — BASIC METABOLIC PANEL
Anion gap: 8 (ref 5–15)
BUN: 14 mg/dL (ref 6–20)
CO2: 30 mmol/L (ref 22–32)
Calcium: 9.1 mg/dL (ref 8.9–10.3)
Chloride: 104 mmol/L (ref 101–111)
Creatinine, Ser: 0.65 mg/dL (ref 0.44–1.00)
GFR calc Af Amer: >60 mL/min (ref >60)
GFR calc non Af Amer: >60 mL/min (ref >60)
Glucose, Bld: 104 mg/dL — ABNORMAL HIGH (ref 65–99)
Potassium: 4 mmol/L (ref 3.5–5.1)
Sodium: 142 mmol/L (ref 135–145)

## 2015-05-09 LAB — CBC
HCT: 33.3 % — ABNORMAL LOW (ref 36.0–46.0)
Hemoglobin: 11.1 g/dL — ABNORMAL LOW (ref 12.0–15.0)
MCH: 30.3 pg (ref 26.0–34.0)
MCHC: 33.3 g/dL (ref 30.0–36.0)
MCV: 91 fL (ref 78.0–100.0)
Platelets: 342 10*3/uL (ref 150–400)
RBC: 3.66 MIL/uL — ABNORMAL LOW (ref 3.87–5.11)
RDW: 13.4 % (ref 11.5–15.5)
WBC: 16.4 10*3/uL — ABNORMAL HIGH (ref 4.0–10.5)

## 2015-05-09 MED ORDER — HYDROMORPHONE HCL 4 MG PO TABS: 4.0000 mg | ORAL_TABLET | ORAL | Status: DC | PRN

## 2015-05-09 MED ORDER — HYDROMORPHONE HCL 2 MG PO TABS: 4.0000 mg | ORAL_TABLET | ORAL | Status: DC | PRN

## 2015-05-09 MED ORDER — HYDROMORPHONE HCL 4 MG PO TABS
4.0000 mg | ORAL_TABLET | ORAL | Status: DC | PRN
Start: 2015-05-09 — End: 2015-05-09

## 2015-05-09 MED ORDER — CYCLOBENZAPRINE HCL 5 MG PO TABS: 5.0000 mg | ORAL_TABLET | Freq: Three times a day (TID) | ORAL | Status: DC | PRN

## 2015-05-09 MED ORDER — RIVAROXABAN 10 MG PO TABS: 10.0000 mg | ORAL_TABLET | Freq: Every day | ORAL | Status: DC

## 2015-05-09 MED ORDER — HYDROMORPHONE HCL 2 MG PO TABS: 2.0000 mg | ORAL_TABLET | ORAL | Status: DC | PRN

## 2015-05-09 MED ORDER — CYCLOBENZAPRINE HCL 5 MG PO TABS
5.0000 mg | ORAL_TABLET | Freq: Three times a day (TID) | ORAL | Status: DC | PRN
  Administered 2015-05-09: 5 mg via ORAL
  Filled 2015-05-09: qty 1

## 2015-05-09 NOTE — Telephone Encounter (Signed)
Pt needs refill on lamoTRIgine (LAMICTAL) 100 MG tablet and levETIRAcetam (KEPPRA) 500 MG tablet. Please call pt to discuss.

## 2015-05-09 NOTE — Care Management Note (Signed)
Case Management Note  Patient Details  Name: [name] MRN: [id number] Date of Birth: 08/13/1965  Subjective/Objective:                  TOTAL LEFT KNEE ARTHROPLASTY (Left) Action/Plan: Discharge planning Expected Discharge Date:  05/09/15               Expected Discharge Plan:  Home w Home Health Services  In-House Referral:     Discharge planning Services  CM Consult  Post Acute Care Choice:    Choice offered to:  Patient  DME Arranged:  N/A DME Agency:  NA  HH Arranged:  PT HH Agency:  [facility]  Status of Service:  Completed, signed off  Medicare Important Message Given:    Date Medicare IM Given:    Medicare IM give by:    Date Additional Medicare IM Given:    Additional Medicare Important Message give by:     If discussed at Long Length of Stay Meetings, dates discussed:    Additional Comments: CM met with pt in room to offer choice of home health agency.  Pt chooses [facility] to render HHPT. Referral given to [facility] rep, Tim.  Pt has both a rolling walker and a 3n1 at home.  No other CM needs were communicated. , Jinan Christine, RN 05/09/2015, 10:43 AM  

## 2015-05-09 NOTE — Progress Notes (Signed)
Occupational Therapy Treatment Patient Details Name: SUNG RENTON MRN: [id number] DOB: 15-Jan-1965 Today's Date: 05/09/2015    History of present illness L TKA, h/o epilepsy/Vertigo   OT comments  Pt progressing towards goals; goals were not met this session, but pt verbalizes understanding of all and no further OT is needed at this time  Follow Up Recommendations  No OT follow up;Supervision - Intermittent    Equipment Recommendations  None recommended by OT    Recommendations for Other Services      Precautions / Restrictions Precautions Precautions: Knee Restrictions LLE Weight Bearing: Weight bearing as tolerated       Mobility Bed Mobility         Supine to sit: Modified independent (Device/Increase time) Sit to supine: Min assist   General bed mobility comments: HOB raised.  Pt used UEs to assist with getting leg off bed  Transfers   Equipment used: Rolling walker (2 wheeled)   Sit to Stand: Supervision         General transfer comment: vc for UE placement    Balance                                   ADL                           Toilet Transfer: Supervision/safety;BSC;RW;Ambulation             General ADL Comments: pt ambulated to bathroom and completed ADL, sit to stand with min A. She doesn't have a reacher at home--educated on uses.  Son and daughter in law are available to assist.  Pt verbalizes understanding of tub bench transfer and did not feel she needed to practice this.        Vision                     Perception     Praxis      Cognition   Behavior During Therapy: WFL for tasks assessed/performed Overall Cognitive Status: Within Functional Limits for tasks assessed                       Extremity/Trunk Assessment               Exercises     Shoulder Instructions       General Comments      Pertinent Vitals/ Pain       Pain Score: 6  Pain Location: L knee Pain  Descriptors / Indicators: Aching Pain Intervention(s): Limited activity within patient's tolerance;Monitored during session;Premedicated before session;Repositioned;Ice applied  Home Living                                          Prior Functioning/Environment              Frequency       Progress Toward Goals  OT Goals(current goals can now be found in the care plan section)  Progress towards OT goals: Progressing toward goals--no further OT is needed     Plan Discharge plan remains appropriate    Co-evaluation                 End of Session     Activity Tolerance Patient tolerated  treatment well;No increased pain   Patient Left in bed;with call bell/phone within reach   Nurse Communication          Time: 0902-0923 OT Time Calculation (min): 21 min  Charges: OT General Charges $OT Visit: 1 Procedure OT Treatments $Self Care/Home Management : 8-22 mins  Anne Boltz 05/09/2015, 9:45 AM  Lesle Chris, OTR/L [phone] 05/09/2015

## 2015-05-09 NOTE — Progress Notes (Signed)
Physical Therapy Treatment Patient Details Name: [name] MRN: [id number] DOB: [birth year] Today's Date: 05/09/2015    History of Present Illness L TKA, h/o epilepsy/Vertigo    PT Comments    POD # 2 Assisted OOB to amb to bathroom then in hallway.  Practiced 2 steps with 2 rails then performed all supine TKR TE's following HEP handout.  Instructed on proper tech and use of ICE.   Follow Up Recommendations  Home health PT     Equipment Recommendations  None recommended by PT    Recommendations for Other Services       Precautions / Restrictions Precautions Precautions: Knee Precaution Comments: reviewed knee precautions and no pillow under knee  Required Braces or Orthoses: Knee Immobilizer - Left Restrictions Weight Bearing Restrictions: No LLE Weight Bearing: Weight bearing as tolerated    Mobility  Bed Mobility Overal bed mobility: Needs Assistance Bed Mobility: Supine to Sit     Supine to sit: Modified independent (Device/Increase time)     General bed mobility comments: HOB raised.  Pt used UEs to assist with getting leg off bed  Transfers Overall transfer level: Needs assistance Equipment used: Rolling walker (2 wheeled) Transfers: Sit to/from Stand Sit to Stand: Supervision         General transfer comment: vc for UE placement  Ambulation/Gait Ambulation/Gait assistance: Supervision Ambulation Distance (Feet): 65 Feet Assistive device: Rolling walker (2 wheeled) Gait Pattern/deviations: Step-to pattern     General Gait Details: verbal cues sequencing and positioning in RW plus increased time   Stairs Stairs: Yes Stairs assistance: Min guard Stair Management: Alternating pattern;Forwards Number of Stairs: 2 General stair comments: one initial VC on proper sequencing and safety  Wheelchair Mobility    Modified Rankin (Stroke Patients Only)       Balance                                    Cognition                             Exercises   Total Knee Replacement TE's 10 reps B LE ankle pumps 10 reps towel squeezes 10 reps knee presses 10 reps heel slides  10 reps SAQ's 10 reps SLR's 10 reps ABD Followed by ICE     General Comments        Pertinent Vitals/Pain Pain Assessment: 0-10 Pain Score: 8  Pain Location: L knee Pain Descriptors / Indicators: Aching Pain Intervention(s): Limited activity within patient's tolerance;Premedicated before session;Repositioned;Ice applied    Home Living                      Prior Function            PT Goals (current goals can now be found in the care plan section) Progress towards PT goals: Progressing toward goals    Frequency  7X/week    PT Plan Current plan remains appropriate    Co-evaluation             End of Session Equipment Utilized During Treatment: Gait belt Activity Tolerance: Patient tolerated treatment well Patient left: in chair;with call bell/phone within reach     Time: 0935-1015 PT Time Calculation (min) (ACUTE ONLY): 40 min  Charges:  $Gait Training: 8-22 mins $Therapeutic Exercise: 8-22 mins $Therapeutic Activity: 8-22 mins  G Codes:      Rica Koyanagi  PTA [facility]  Acute  Rehab Pager      [phone]

## 2015-05-09 NOTE — Telephone Encounter (Signed)
I faxed patient, Murrell Redden form on 05/09/15 to the insurance company.

## 2015-05-09 NOTE — Progress Notes (Signed)
   Subjective: 2 Days Post-Op Procedure(s) (LRB): TOTAL LEFT KNEE ARTHROPLASTY (Left) Patient reports pain as mild.  Still having some spasms.  Will change to Flexeril. Patient seen in rounds with Dr. Wynelle Link. Patient is well, but has had some minor complaints of pain in the knee, requiring pain medications Patient is ready to go home later today.  Objective: Vital signs in last 24 hours: Temp:  [98.2 F (36.8 C)-98.7 F (37.1 C)] 98.7 F (37.1 C) (04/26 0507) Pulse Rate:  [87-90] 90 (04/26 0507) Resp:  [16] 16 (04/26 0507) BP: (116-130)/(64-77) 116/64 mmHg (04/26 0507) SpO2:  [94 %-95 %] 95 % (04/26 0507)  Intake/Output from previous day:  Intake/Output Summary (Last 24 hours) at 05/09/15 0757 Last data filed at 05/09/15 0500  Gross per 24 hour  Intake    840 ml  Output   2400 ml  Net  -1560 ml    Labs:  Recent Labs  05/08/15 0438 05/09/15 0426  HGB 11.6* 11.1*    Recent Labs  05/08/15 0438 05/09/15 0426  WBC 17.0* 16.4*  RBC 3.92 3.66*  HCT 34.7* 33.3*  PLT 302 342    Recent Labs  05/08/15 0438 05/09/15 0426  NA 138 142  K 4.0 4.0  CL 102 104  CO2 24 30  BUN 7 14  CREATININE 0.58 0.65  GLUCOSE 115* 104*  CALCIUM 8.9 9.1   No results for input(s): LABPT, INR in the last 72 hours.  EXAM: General - Patient is Alert, Appropriate and Oriented Extremity - Neurovascular intact Sensation intact distally Dorsiflexion/Plantar flexion intact Incision - clean, dry, no drainage Motor Function - intact, moving foot and toes well on exam.   Assessment/Plan: 2 Days Post-Op Procedure(s) (LRB): TOTAL LEFT KNEE ARTHROPLASTY (Left) Procedure(s) (LRB): TOTAL LEFT KNEE ARTHROPLASTY (Left) Past Medical History  Diagnosis Date  . Vertigo   . Constipation   . Restless leg   . High cholesterol   . Chronic bronchitis ([place])     "yearly; when the weather changes" (04/22/2012)  . [id number])     "1/wk" (04/22/2012)  . Depression   . Epilepsy ([place])    "been having them right often here lately" (04/22/2012)  . Seizures ([place])   . Arthritis     "knees" (04/22/2012)  . Osteoarthritis     [name] 04/22/2012  . Colon polyps     adenomatous and hyperplastic-  . Rectal bleed     in toilet- bright red  . Other convulsions 05/21/12    non-epileptic spells  . Bronchitis   . Fatty liver   . Chronic kidney disease   . Eczema    Principal Problem:   OA (osteoarthritis) of knee  Estimated body mass index is 34.96 kg/(m^2) as calculated from the following:   Height as of this encounter: 5' (1.524 m).   Weight as of this encounter: 81.194 kg (179 lb). Up with therapy Discharge home with home health Diet - Cardiac diet Follow up - in 2 weeks Activity - WBAT Disposition - Home Condition Upon Discharge - Good D/C Meds - See DC Summary DVT Prophylaxis - Xarelto  Arlee Muslim, PA-C Orthopaedic Surgery 05/09/2015, 7:57 AM

## 2015-05-09 NOTE — Telephone Encounter (Signed)
I called pt, the [facility] did not have prescription for keppra.  I called pharmacist at [facility].  Last time filled with them 04-13-15.  Insurance states too early (05-25-15 the next time, even with increased dose).   She stated they go to a lot of pharmacy's  [facility] is one they have used recently  [phone] in [place].  I spoke to pt again.  She will call me tomorrow and let me know what she found out.

## 2015-05-09 NOTE — Discharge Summary (Signed)
Physician Discharge Summary   Patient ID: [name] MRN: [id number] DOB/AGE: Aug 17, 1965 50 y.o.  Admit date: 05/07/2015 Discharge date: 05-09-2015  Primary Diagnosis:  Osteoarthritis Left knee(s)  Admission Diagnoses:  Past Medical History  Diagnosis Date  . Vertigo   . Constipation   . Restless leg   . High cholesterol   . Chronic bronchitis ([place])     "yearly; when the weather changes" (04/22/2012)  . VQQVZDGL(875.6)     "1/wk" (04/22/2012)  . Depression   . Epilepsy ([place])     "been having them right often here lately" (04/22/2012)  . Seizures ([place])   . Arthritis     "knees" (04/22/2012)  . Osteoarthritis     [name] 04/22/2012  . Colon polyps     adenomatous and hyperplastic-  . Rectal bleed     in toilet- bright red  . Other convulsions 05/21/12    non-epileptic spells  . Bronchitis   . Fatty liver   . Chronic kidney disease   . Eczema    Discharge Diagnoses:   Principal Problem:   OA (osteoarthritis) of knee  Estimated body mass index is 34.96 kg/(m^2) as calculated from the following:   Height as of this encounter: 5' (1.524 m).   Weight as of this encounter: 81.194 kg (179 lb).  Procedure:  Procedure(s) (LRB): TOTAL LEFT KNEE ARTHROPLASTY (Left)   Consults: None  HPI: [name] is a 50 y.o. year old female with end stage OA of her left knee with progressively worsening pain and dysfunction. She has constant pain, with activity and at rest and significant functional deficits with difficulties even with ADLs. She has had extensive non-op management including analgesics, injections of cortisone and viscosupplements, and home exercise program, but remains in significant pain with significant dysfunction. Radiographs show bone on bone arthritis all 3 compartments. She presents now for left Total Knee Arthroplasty.   Laboratory Data: Admission on 05/07/2015  Component Date Value Ref Range Status  . WBC 05/08/2015 17.0* 4.0 - 10.5 K/uL Final  . RBC  05/08/2015 3.92  3.87 - 5.11 MIL/uL Final  . Hemoglobin 05/08/2015 11.6* 12.0 - 15.0 g/dL Final  . HCT 05/08/2015 34.7* 36.0 - 46.0 % Final  . MCV 05/08/2015 88.5  78.0 - 100.0 fL Final  . MCH 05/08/2015 29.6  26.0 - 34.0 pg Final  . MCHC 05/08/2015 33.4  30.0 - 36.0 g/dL Final  . RDW 05/08/2015 13.0  11.5 - 15.5 % Final  . Platelets 05/08/2015 302  150 - 400 K/uL Final  . Sodium 05/08/2015 138  135 - 145 mmol/L Final  . Potassium 05/08/2015 4.0  3.5 - 5.1 mmol/L Final  . Chloride 05/08/2015 102  101 - 111 mmol/L Final  . CO2 05/08/2015 24  22 - 32 mmol/L Final  . Glucose, Bld 05/08/2015 115* 65 - 99 mg/dL Final  . BUN 05/08/2015 7  6 - 20 mg/dL Final  . Creatinine, Ser 05/08/2015 0.58  0.44 - 1.00 mg/dL Final  . Calcium 05/08/2015 8.9  8.9 - 10.3 mg/dL Final  . GFR calc non Af Amer 05/08/2015 >60  >60 mL/min Final  . GFR calc Af Amer 05/08/2015 >60  >60 mL/min Final   Comment: (NOTE) The eGFR has been calculated using the CKD EPI equation. This calculation has not been validated in all clinical situations. eGFR's persistently <60 mL/min signify possible Chronic Kidney Disease.   . Anion gap 05/08/2015 12  5 - 15 Final  . WBC 05/09/2015 16.4*  4.0 - 10.5 K/uL Final  . RBC 05/09/2015 3.66* 3.87 - 5.11 MIL/uL Final  . Hemoglobin 05/09/2015 11.1* 12.0 - 15.0 g/dL Final  . HCT 05/09/2015 33.3* 36.0 - 46.0 % Final  . MCV 05/09/2015 91.0  78.0 - 100.0 fL Final  . MCH 05/09/2015 30.3  26.0 - 34.0 pg Final  . MCHC 05/09/2015 33.3  30.0 - 36.0 g/dL Final  . RDW 05/09/2015 13.4  11.5 - 15.5 % Final  . Platelets 05/09/2015 342  150 - 400 K/uL Final  . Sodium 05/09/2015 142  135 - 145 mmol/L Final  . Potassium 05/09/2015 4.0  3.5 - 5.1 mmol/L Final  . Chloride 05/09/2015 104  101 - 111 mmol/L Final  . CO2 05/09/2015 30  22 - 32 mmol/L Final  . Glucose, Bld 05/09/2015 104* 65 - 99 mg/dL Final  . BUN 05/09/2015 14  6 - 20 mg/dL Final  . Creatinine, Ser 05/09/2015 0.65  0.44 - 1.00 mg/dL  Final  . Calcium 05/09/2015 9.1  8.9 - 10.3 mg/dL Final  . GFR calc non Af Amer 05/09/2015 >60  >60 mL/min Final  . GFR calc Af Amer 05/09/2015 >60  >60 mL/min Final   Comment: (NOTE) The eGFR has been calculated using the CKD EPI equation. This calculation has not been validated in all clinical situations. eGFR's persistently <60 mL/min signify possible Chronic Kidney Disease.   Georgiann Hahn gap 05/09/2015 8  5 - 15 Final  Hospital Outpatient Visit on 05/04/2015  Component Date Value Ref Range Status  . aPTT 05/04/2015 32  24 - 37 seconds Final  . Prothrombin Time 05/04/2015 14.0  11.6 - 15.2 seconds Final  . INR 05/04/2015 1.10  0.00 - 1.49 Final  . ABO/RH(D) 05/04/2015 A POS   Final  . Antibody Screen 05/04/2015 NEG   Final  . Sample Expiration 05/04/2015 05/10/2015   Final  . Extend sample reason 05/04/2015 NO TRANSFUSIONS OR PREGNANCY IN THE PAST 3 MONTHS   Final  . Color, Urine 05/04/2015 YELLOW  YELLOW Final  . APPearance 05/04/2015 CLEAR  CLEAR Final  . Specific Gravity, Urine 05/04/2015 1.021  1.005 - 1.030 Final  . pH 05/04/2015 8.0  5.0 - 8.0 Final  . Glucose, UA 05/04/2015 NEGATIVE  NEGATIVE mg/dL Final  . Hgb urine dipstick 05/04/2015 NEGATIVE  NEGATIVE Final  . Bilirubin Urine 05/04/2015 NEGATIVE  NEGATIVE Final  . Ketones, ur 05/04/2015 NEGATIVE  NEGATIVE mg/dL Final  . Protein, ur 05/04/2015 NEGATIVE  NEGATIVE mg/dL Final  . Nitrite 05/04/2015 NEGATIVE  NEGATIVE Final  . Leukocytes, UA 05/04/2015 NEGATIVE  NEGATIVE Final   MICROSCOPIC NOT DONE ON URINES WITH NEGATIVE PROTEIN, BLOOD, LEUKOCYTES, NITRITE, OR GLUCOSE <1000 mg/dL.  Marland Kitchen MRSA, PCR 05/04/2015 NEGATIVE  NEGATIVE Final  . Staphylococcus aureus 05/04/2015 NEGATIVE  NEGATIVE Final   Comment:        The Xpert SA Assay (FDA approved for NASAL specimens in patients over 74 years of age), is one component of a comprehensive surveillance program.  Test performance has been validated by [facility] for patients  greater than or equal to 54 year old. It is not intended to diagnose infection nor to guide or monitor treatment.   . ABO/RH(D) 05/04/2015 A POS   Final  Office Visit on 05/01/2015  Component Date Value Ref Range Status  . Influenza A 05/01/2015 Negative  Negative Final  . Influenza B 05/01/2015 Negative  Negative Final   Comment: If the test is negative for the presence of  influenza A or influenza B antigen, infection due to influenza cannot be ruled-out because the antigen present in the sample may be below the detection limit of the test. It is recommended that these results be confirmed by viral culture or an FDA-cleared influenza A and B molecular assay.   . Glucose 05/01/2015 93  65 - 99 mg/dL Final  . BUN 05/01/2015 11  6 - 24 mg/dL Final  . Creatinine, Ser 05/01/2015 0.64  0.57 - 1.00 mg/dL Final  . GFR calc non Af Amer 05/01/2015 105  >59 mL/min/1.73 Final  . GFR calc Af Amer 05/01/2015 121  >59 mL/min/1.73 Final  . BUN/Creatinine Ratio 05/01/2015 17  9 - 23 Final  . Sodium 05/01/2015 142  134 - 144 mmol/L Final  . Potassium 05/01/2015 4.4  3.5 - 5.2 mmol/L Final  . Chloride 05/01/2015 102  96 - 106 mmol/L Final  . CO2 05/01/2015 25  18 - 29 mmol/L Final  . Calcium 05/01/2015 8.8  8.7 - 10.2 mg/dL Final  . Total Protein 05/01/2015 6.1  6.0 - 8.5 g/dL Final  . Albumin 05/01/2015 4.1  3.5 - 5.5 g/dL Final  . Globulin, Total 05/01/2015 2.0  1.5 - 4.5 g/dL Final  . Albumin/Globulin Ratio 05/01/2015 2.1  1.2 - 2.2 Final  . Bilirubin Total 05/01/2015 0.4  0.0 - 1.2 mg/dL Final  . Alkaline Phosphatase 05/01/2015 64  39 - 117 IU/L Final  . AST 05/01/2015 25  0 - 40 IU/L Final  . ALT 05/01/2015 35* 0 - 32 IU/L Final  . WBC 05/01/2015 11.9* 3.4 - 10.8 x10E3/uL Final  . RBC 05/01/2015 3.78  3.77 - 5.28 x10E6/uL Final  . Hemoglobin 05/01/2015 11.5  11.1 - 15.9 g/dL Final  . Hematocrit 05/01/2015 34.6  34.0 - 46.6 % Final  . MCV 05/01/2015 92  79 - 97 fL Final  . MCH 05/01/2015  30.4  26.6 - 33.0 pg Final  . MCHC 05/01/2015 33.2  31.5 - 35.7 g/dL Final  . RDW 05/01/2015 13.4  12.3 - 15.4 % Final  . Platelets 05/01/2015 249  150 - 379 x10E3/uL Final  . Neutrophils 05/01/2015 67   Final  . Lymphs 05/01/2015 24   Final  . Monocytes 05/01/2015 9   Final  . Eos 05/01/2015 0   Final  . Basos 05/01/2015 0   Final  . Neutrophils Absolute 05/01/2015 7.8* 1.4 - 7.0 x10E3/uL Final  . Lymphocytes Absolute 05/01/2015 2.9  0.7 - 3.1 x10E3/uL Final  . Monocytes Absolute 05/01/2015 1.1* 0.1 - 0.9 x10E3/uL Final  . EOS (ABSOLUTE) 05/01/2015 0.0  0.0 - 0.4 x10E3/uL Final  . Basophils Absolute 05/01/2015 0.0  0.0 - 0.2 x10E3/uL Final  . Immature Granulocytes 05/01/2015 0   Final  . Immature Grans (Abs) 05/01/2015 0.0  0.0 - 0.1 x10E3/uL Final  Office Visit on 04/23/2015  Component Date Value Ref Range Status  . Glucose 04/23/2015 83  65 - 99 mg/dL Final  . BUN 04/23/2015 11  6 - 24 mg/dL Final  . Creatinine, Ser 04/23/2015 0.58  0.57 - 1.00 mg/dL Final  . GFR calc non Af Amer 04/23/2015 109  >59 mL/min/1.73 Final  . GFR calc Af Amer 04/23/2015 125  >59 mL/min/1.73 Final  . BUN/Creatinine Ratio 04/23/2015 19  9 - 23 Final  . Sodium 04/23/2015 143  134 - 144 mmol/L Final  . Potassium 04/23/2015 4.3  3.5 - 5.2 mmol/L Final  . Chloride 04/23/2015 103  96 - 106 mmol/L Final  .  CO2 04/23/2015 22  18 - 29 mmol/L Final  . Calcium 04/23/2015 9.1  8.7 - 10.2 mg/dL Final  . Total Protein 04/23/2015 6.2  6.0 - 8.5 g/dL Final  . Albumin 04/23/2015 4.3  3.5 - 5.5 g/dL Final  . Globulin, Total 04/23/2015 1.9  1.5 - 4.5 g/dL Final  . Albumin/Globulin Ratio 04/23/2015 2.3* 1.2 - 2.2 Final  . Bilirubin Total 04/23/2015 0.3  0.0 - 1.2 mg/dL Final  . Alkaline Phosphatase 04/23/2015 61  39 - 117 IU/L Final  . AST 04/23/2015 38  0 - 40 IU/L Final  . ALT 04/23/2015 46* 0 - 32 IU/L Final  . WBC 04/23/2015 6.5  3.4 - 10.8 x10E3/uL Final  . RBC 04/23/2015 4.15  3.77 - 5.28 x10E6/uL Final  .  Hemoglobin 04/23/2015 12.5  11.1 - 15.9 g/dL Final  . Hematocrit 04/23/2015 37.5  34.0 - 46.6 % Final  . MCV 04/23/2015 90  79 - 97 fL Final  . MCH 04/23/2015 30.1  26.6 - 33.0 pg Final  . MCHC 04/23/2015 33.3  31.5 - 35.7 g/dL Final  . RDW 04/23/2015 13.5  12.3 - 15.4 % Final  . Platelets 04/23/2015 278  150 - 379 x10E3/uL Final  . Neutrophils 04/23/2015 47   Final  . Lymphs 04/23/2015 41   Final  . Monocytes 04/23/2015 12   Final  . Eos 04/23/2015 0   Final  . Basos 04/23/2015 0   Final  . Neutrophils Absolute 04/23/2015 3.0  1.4 - 7.0 x10E3/uL Final  . Lymphocytes Absolute 04/23/2015 2.7  0.7 - 3.1 x10E3/uL Final  . Monocytes Absolute 04/23/2015 0.8  0.1 - 0.9 x10E3/uL Final  . EOS (ABSOLUTE) 04/23/2015 0.0  0.0 - 0.4 x10E3/uL Final  . Basophils Absolute 04/23/2015 0.0  0.0 - 0.2 x10E3/uL Final  . Immature Granulocytes 04/23/2015 0   Final  . Immature Grans (Abs) 04/23/2015 0.0  0.0 - 0.1 x10E3/uL Final  . Lamotrigine Lvl 04/23/2015 4.5  2.0 - 20.0 ug/mL Final                                   Detection Limit = 1.0  Office Visit on 04/09/2015  Component Date Value Ref Range Status  . Bayer DCA Hb A1c Waived 04/09/2015 5.6  <7.0 % Final   Comment:                                       Diabetic Adult            <7.0                                       Healthy Adult        4.3 - 5.7                                                           (DCCT/NGSP) American Diabetes Association's Summary of Glycemic Recommendations for Adults with Diabetes: Hemoglobin A1c <7.0%. More stringent glycemic goals (A1c <6.0%) may further reduce complications at the cost of increased  risk of hypoglycemia.   . Glucose 04/09/2015 97  65 - 99 mg/dL Final  . BUN 04/09/2015 15  6 - 24 mg/dL Final  . Creatinine, Ser 04/09/2015 0.58  0.57 - 1.00 mg/dL Final  . GFR calc non Af Amer 04/09/2015 109  >59 mL/min/1.73 Final  . GFR calc Af Amer 04/09/2015 125  >59 mL/min/1.73 Final  . BUN/Creatinine Ratio  04/09/2015 26* 9 - 23 Final   Comment: **Effective April 16, 2015 BUN/Creatinine Ratio**   reference interval will be changing to:              Age                  Female          Female      0 days   -  7 days          9 - 25         9 - 26      8 days   - 30 days          8 - 43        10 - 33      1 month  -  6 months       11 - 71        11 - 58      7 months -  1 year         20 - 40        20 - 35      2 years  -  5 years        22 - 72        19 - 64      6 years  - 19 years        68 - 40        13 - 21     13 years  - 76 years        81 - 59        10 - 13     18 years  - 33 years         23 - 23         9 - 71                >59 years        10 - 52        12 - 64   . Sodium 04/09/2015 144  134 - 144 mmol/L Final  . Potassium 04/09/2015 4.4  3.5 - 5.2 mmol/L Final  . Chloride 04/09/2015 106  96 - 106 mmol/L Final  . CO2 04/09/2015 23  18 - 29 mmol/L Final  . Calcium 04/09/2015 9.2  8.7 - 10.2 mg/dL Final  . Total Protein 04/09/2015 6.4  6.0 - 8.5 g/dL Final  . Albumin 04/09/2015 4.5  3.5 - 5.5 g/dL Final  . Globulin, Total 04/09/2015 1.9  1.5 - 4.5 g/dL Final  . Albumin/Globulin Ratio 04/09/2015 2.4* 1.2 - 2.2 Final                 **Please note reference interval change**  . Bilirubin Total 04/09/2015 0.3  0.0 - 1.2 mg/dL Final  . Alkaline Phosphatase 04/09/2015 66  39 - 117 IU/L Final  . AST 04/09/2015 32  0 - 40 IU/L Final  . ALT 04/09/2015 40* 0 - 32 IU/L Final  . WBC 04/09/2015 8.3  3.4 - 10.8 x10E3/uL Final  . RBC 04/09/2015 4.41  3.77 - 5.28 x10E6/uL Final  . Hemoglobin 04/09/2015 13.3  11.1 - 15.9 g/dL Final  . Hematocrit 04/09/2015 40.9  34.0 - 46.6 % Final  . MCV 04/09/2015 93  79 - 97 fL Final  . MCH 04/09/2015 30.2  26.6 - 33.0 pg Final  . MCHC 04/09/2015 32.5  31.5 - 35.7 g/dL Final  . RDW 04/09/2015 13.5  12.3 - 15.4 % Final  . Platelets 04/09/2015 306  150 - 379 x10E3/uL Final  . Neutrophils 04/09/2015 57   Final  . Lymphs 04/09/2015 35   Final  .  Monocytes 04/09/2015 8   Final  . Eos 04/09/2015 0   Final  . Basos 04/09/2015 0   Final  . Neutrophils Absolute 04/09/2015 4.7  1.4 - 7.0 x10E3/uL Final  . Lymphocytes Absolute 04/09/2015 2.9  0.7 - 3.1 x10E3/uL Final  . Monocytes Absolute 04/09/2015 0.7  0.1 - 0.9 x10E3/uL Final  . EOS (ABSOLUTE) 04/09/2015 0.0  0.0 - 0.4 x10E3/uL Final  . Basophils Absolute 04/09/2015 0.0  0.0 - 0.2 x10E3/uL Final  . Immature Granulocytes 04/09/2015 0   Final  . Immature Grans (Abs) 04/09/2015 0.0  0.0 - 0.1 x10E3/uL Final  . WBC, UA 04/09/2015 0-5  0 -  5 /hpf Final  . RBC, UA 04/09/2015 None seen  0 -  2 /hpf Final  . Epithelial Cells (non renal) 04/09/2015 >10* 0 - 10 /hpf Final  . Bacteria, UA 04/09/2015 Few  None seen/Few Final  . Specific Gravity, UA 04/09/2015 1.020  1.005 - 1.030 Final  . pH, UA 04/09/2015 7.0  5.0 - 7.5 Final  . Color, UA 04/09/2015 Yellow  Yellow Final  . Appearance Ur 04/09/2015 Clear  Clear Final  . Leukocytes, UA 04/09/2015 Negative  Negative Final  . Protein, UA 04/09/2015 Negative  Negative/Trace Final  . Glucose, UA 04/09/2015 Negative  Negative Final  . Ketones, UA 04/09/2015 Negative  Negative Final  . RBC, UA 04/09/2015 Negative  Negative Final  . Bilirubin, UA 04/09/2015 Negative  Negative Final  . Urobilinogen, Ur 04/09/2015 0.2  0.2 - 1.0 mg/dL Final  . Nitrite, UA 04/09/2015 Negative  Negative Final  . Cholesterol, Total 04/09/2015 217* 100 - 199 mg/dL Final  . Triglycerides 04/09/2015 156* 0 - 149 mg/dL Final  . HDL 04/09/2015 50  >39 mg/dL Final  . VLDL Cholesterol Cal 04/09/2015 31  5 - 40 mg/dL Final  . LDL Calculated 04/09/2015 136* 0 - 99 mg/dL Final  . Chol/HDL Ratio 04/09/2015 4.3  0.0 - 4.4 ratio units Final   Comment:                                   T. Chol/HDL Ratio                                             Men  Women                               1/2 Avg.Risk  3.4    3.3  Avg.Risk  5.0    4.4                                 2X Avg.Risk  9.6    7.1                                3X Avg.Risk 23.4   11.0      X-Rays:No results found.  EKG: Orders placed or performed during the hospital encounter of 01/23/15  . ED EKG  . ED EKG  . EKG 12-Lead  . EKG 12-Lead     Hospital Course: [name] is a 50 y.o. who was admitted to [facility]. They were brought to the operating room on 05/07/2015 and underwent Procedure(s): TOTAL LEFT KNEE ARTHROPLASTY.  Patient tolerated the procedure well and was later transferred to the recovery room and then to the orthopaedic floor for postoperative care.  They were given PO and IV analgesics for pain control following their surgery.  They were given 24 hours of postoperative antibiotics of  Anti-infectives    Start     Dose/Rate Route Frequency Ordered Stop   05/07/15 1400  ceFAZolin (ANCEF) IVPB 2g/100 mL premix     2 g 200 mL/hr over 30 Minutes Intravenous Every 6 hours 05/07/15 1140 05/07/15 2040   05/07/15 0709  ceFAZolin (ANCEF) IVPB 2g/100 mL premix     2 g 200 mL/hr over 30 Minutes Intravenous On call to O.R. 05/07/15 3662 05/07/15 0804     and started on DVT prophylaxis in the form of Xarelto.   PT and OT were ordered for total joint protocol.  Discharge planning consulted to help with postop disposition and equipment needs.  Patient had a tough night on the evening of surgery with pain.  Better the next morning.  They started to get up OOB with therapy on day one. Hemovac drain was pulled without difficulty.  Continued to work with therapy into day two.  Dressing was changed on day two and the incision was healing well. Patient was seen in rounds and was ready to go home on day two.  Discharge home with home health Diet - Cardiac diet Follow up - in 2 weeks Activity - WBAT Disposition - Home Condition Upon Discharge - Good D/C Meds - See DC Summary DVT Prophylaxis - Xarelto  Discharge Instructions    Call MD / Call 911    Complete by:   As directed   If you experience chest pain or shortness of breath, CALL 911 and be transported to the hospital emergency room.  If you develope a fever above 101 F, pus (white drainage) or increased drainage or redness at the wound, or calf pain, call your surgeon's office.     Change dressing    Complete by:  As directed   Change dressing daily with sterile 4 x 4 inch gauze dressing and apply TED hose. Do not submerge the incision under water.     Constipation Prevention    Complete by:  As directed   Drink plenty of fluids.  Prune juice may be helpful.  You may use a stool softener, such as Colace (over the counter) 100 mg twice a day.  Use MiraLax (over the counter) for constipation as needed.     Diet - low sodium heart healthy    Complete by:  As directed      Discharge instructions    Complete by:  As directed   Pick up stool softner and laxative for home use following surgery while on pain medications. Do not submerge incision under water. Please use good hand washing techniques while changing dressing each day. May shower starting three days after surgery. Please use a clean towel to pat the incision dry following showers. Continue to use ice for pain and swelling after surgery. Do not use any lotions or creams on the incision until instructed by your surgeon.  Take Xarelto for two and a half more weeks, then discontinue Xarelto. Once the patient has completed the blood thinner regimen, then take a Baby 81 mg Aspirin daily for three more weeks.  Postoperative Constipation Protocol  Constipation - defined medically as fewer than three stools per week and severe constipation as less than one stool per week.  One of the most common issues patients have following surgery is constipation.  Even if you have a regular bowel pattern at home, your normal regimen is likely to be disrupted due to multiple reasons following surgery.  Combination of anesthesia, postoperative narcotics, change  in appetite and fluid intake all can affect your bowels.  In order to avoid complications following surgery, here are some recommendations in order to help you during your recovery period.  Colace (docusate) - Pick up an over-the-counter form of Colace or another stool softener and take twice a day as long as you are requiring postoperative pain medications.  Take with a full glass of water daily.  If you experience loose stools or diarrhea, hold the colace until you stool forms back up.  If your symptoms do not get better within 1 week or if they get worse, check with your doctor.  Dulcolax (bisacodyl) - Pick up over-the-counter and take as directed by the product packaging as needed to assist with the movement of your bowels.  Take with a full glass of water.  Use this product as needed if not relieved by Colace only.   MiraLax (polyethylene glycol) - Pick up over-the-counter to have on hand.  MiraLax is a solution that will increase the amount of water in your bowels to assist with bowel movements.  Take as directed and can mix with a glass of water, juice, soda, coffee, or tea.  Take if you go more than two days without a movement. Do not use MiraLax more than once per day. Call your doctor if you are still constipated or irregular after using this medication for 7 days in a row.  If you continue to have problems with postoperative constipation, please contact the office for further assistance and recommendations.  If you experience "the worst abdominal pain ever" or develop nausea or vomiting, please contact the office immediatly for further recommendations for treatment.     Do not put a pillow under the knee. Place it under the heel.    Complete by:  As directed      Do not sit on low chairs, stoools or toilet seats, as it may be difficult to get up from low surfaces    Complete by:  As directed      Driving restrictions    Complete by:  As directed   No driving until released by the  physician.     Increase activity slowly as tolerated    Complete by:  As directed      Lifting restrictions    Complete by:  As directed   No lifting until released by the physician.     Patient may shower    Complete by:  As directed   You may shower without a dressing once there is no drainage.  Do not wash over the wound.  If drainage remains, do not shower until drainage stops.     TED hose    Complete by:  As directed   Use stockings (TED hose) for 3 weeks on both leg(s).  You may remove them at night for sleeping.     Weight bearing as tolerated    Complete by:  As directed   Laterality:  left  Extremity:  Lower            Medication List    STOP taking these medications        multivitamin with minerals Tabs tablet      TAKE these medications        citalopram 40 MG tablet  Commonly known as:  CELEXA  Take 1 tablet (40 mg total) by mouth daily.     clonazePAM 0.5 MG tablet  Commonly known as:  KLONOPIN  TAke one each morning and as needed for seizure. Take one at bedtime. Repeat one hour later if needed for sleep.     cyclobenzaprine 5 MG tablet  Commonly known as:  FLEXERIL  Take 1 tablet (5 mg total) by mouth 3 (three) times daily as needed for muscle spasms.     gabapentin 600 MG tablet  Commonly known as:  NEURONTIN  Take 600 mg by mouth 3 (three) times daily.     HYDROmorphone 2 MG tablet  Commonly known as:  DILAUDID  Take 1-2 tablets (2-4 mg total) by mouth every 4 (four) hours as needed for severe pain.     HYDROmorphone 4 MG tablet  Commonly known as:  DILAUDID  Take 1-2 tablets (4-8 mg total) by mouth every 4 (four) hours as needed for moderate pain or severe pain.     lamoTRIgine 100 MG tablet  Commonly known as:  LAMICTAL  Take 1 tablet (100 mg total) by mouth 2 (two) times daily. Keep 12 hours apart.     levETIRAcetam 500 MG tablet  Commonly known as:  KEPPRA  Take 2 tablets PO in the morning and 3 tablets PO in the evening.      pramipexole 0.5 MG tablet  Commonly known as:  MIRAPEX  TAKE ONE TABLET BY MOUTH TWICE DAILY     rivaroxaban 10 MG Tabs tablet  Commonly known as:  XARELTO  Take 1 tablet (10 mg total) by mouth daily with breakfast. Take Xarelto for two and a half more weeks, then discontinue Xarelto. Once the patient has completed the blood thinner regimen, then take a Baby 81 mg Aspirin daily for three more weeks.           Follow-up Information    Follow up with Gearlean Alf, MD. Schedule an appointment as soon as possible for a visit on 05/22/2015.   Specialty:  Orthopedic Surgery   Why:  Call office at [phone] to setup appointment on Tuesday 05/22/2015 with Dr. Wynelle Link.   Contact information:   [address] [place] [address] [phone]       Signed: Arlee Muslim, PA-C Orthopaedic Surgery 05/09/2015, 8:32 AM

## 2015-05-10 DIAGNOSIS — Z9181 History of falling: Secondary | ICD-10-CM | POA: Diagnosis not present

## 2015-05-10 DIAGNOSIS — Z87442 Personal history of urinary calculi: Secondary | ICD-10-CM | POA: Diagnosis not present

## 2015-05-10 DIAGNOSIS — G40901 Epilepsy, unspecified, not intractable, with status epilepticus: Secondary | ICD-10-CM | POA: Diagnosis not present

## 2015-05-10 DIAGNOSIS — F321 Major depressive disorder, single episode, moderate: Secondary | ICD-10-CM | POA: Diagnosis not present

## 2015-05-10 DIAGNOSIS — Z96642 Presence of left artificial hip joint: Secondary | ICD-10-CM | POA: Diagnosis not present

## 2015-05-10 DIAGNOSIS — G2581 Restless legs syndrome: Secondary | ICD-10-CM | POA: Diagnosis not present

## 2015-05-10 DIAGNOSIS — Z79891 Long term (current) use of opiate analgesic: Secondary | ICD-10-CM | POA: Diagnosis not present

## 2015-05-10 DIAGNOSIS — F419 Anxiety disorder, unspecified: Secondary | ICD-10-CM | POA: Diagnosis not present

## 2015-05-10 DIAGNOSIS — M1711 Unilateral primary osteoarthritis, right knee: Secondary | ICD-10-CM | POA: Diagnosis not present

## 2015-05-10 DIAGNOSIS — M19071 Primary osteoarthritis, right ankle and foot: Secondary | ICD-10-CM | POA: Diagnosis not present

## 2015-05-10 DIAGNOSIS — Z79899 Other long term (current) drug therapy: Secondary | ICD-10-CM | POA: Diagnosis not present

## 2015-05-10 DIAGNOSIS — Z96652 Presence of left artificial knee joint: Secondary | ICD-10-CM | POA: Diagnosis not present

## 2015-05-10 DIAGNOSIS — Z471 Aftercare following joint replacement surgery: Secondary | ICD-10-CM | POA: Diagnosis not present

## 2015-05-10 NOTE — Telephone Encounter (Signed)
Attempted to call and busy.

## 2015-05-10 NOTE — Telephone Encounter (Signed)
I called and spoke to mother, then pt.  She did receive her keppra in the mail today, from [facility] mail order.  She did not need lamictal.

## 2015-05-11 DIAGNOSIS — Z4789 Encounter for other orthopedic aftercare: Secondary | ICD-10-CM | POA: Diagnosis not present

## 2015-05-14 DIAGNOSIS — Z471 Aftercare following joint replacement surgery: Secondary | ICD-10-CM | POA: Diagnosis not present

## 2015-05-14 DIAGNOSIS — Z96652 Presence of left artificial knee joint: Secondary | ICD-10-CM | POA: Diagnosis not present

## 2015-05-15 DIAGNOSIS — M19071 Primary osteoarthritis, right ankle and foot: Secondary | ICD-10-CM | POA: Diagnosis not present

## 2015-05-15 DIAGNOSIS — F321 Major depressive disorder, single episode, moderate: Secondary | ICD-10-CM | POA: Diagnosis not present

## 2015-05-15 DIAGNOSIS — Z9181 History of falling: Secondary | ICD-10-CM | POA: Diagnosis not present

## 2015-05-15 DIAGNOSIS — F419 Anxiety disorder, unspecified: Secondary | ICD-10-CM | POA: Diagnosis not present

## 2015-05-15 DIAGNOSIS — Z79899 Other long term (current) drug therapy: Secondary | ICD-10-CM | POA: Diagnosis not present

## 2015-05-15 DIAGNOSIS — M1711 Unilateral primary osteoarthritis, right knee: Secondary | ICD-10-CM | POA: Diagnosis not present

## 2015-05-15 DIAGNOSIS — Z87442 Personal history of urinary calculi: Secondary | ICD-10-CM | POA: Diagnosis not present

## 2015-05-15 DIAGNOSIS — Z471 Aftercare following joint replacement surgery: Secondary | ICD-10-CM | POA: Diagnosis not present

## 2015-05-15 DIAGNOSIS — G2581 Restless legs syndrome: Secondary | ICD-10-CM | POA: Diagnosis not present

## 2015-05-15 DIAGNOSIS — Z96642 Presence of left artificial hip joint: Secondary | ICD-10-CM | POA: Diagnosis not present

## 2015-05-15 DIAGNOSIS — Z96652 Presence of left artificial knee joint: Secondary | ICD-10-CM | POA: Diagnosis not present

## 2015-05-15 DIAGNOSIS — Z79891 Long term (current) use of opiate analgesic: Secondary | ICD-10-CM | POA: Diagnosis not present

## 2015-05-15 DIAGNOSIS — G40901 Epilepsy, unspecified, not intractable, with status epilepticus: Secondary | ICD-10-CM | POA: Diagnosis not present

## 2015-05-21 DIAGNOSIS — Z96642 Presence of left artificial hip joint: Secondary | ICD-10-CM | POA: Diagnosis not present

## 2015-05-21 DIAGNOSIS — M1711 Unilateral primary osteoarthritis, right knee: Secondary | ICD-10-CM | POA: Diagnosis not present

## 2015-05-21 DIAGNOSIS — Z96652 Presence of left artificial knee joint: Secondary | ICD-10-CM | POA: Diagnosis not present

## 2015-05-21 DIAGNOSIS — Z9181 History of falling: Secondary | ICD-10-CM | POA: Diagnosis not present

## 2015-05-21 DIAGNOSIS — Z87442 Personal history of urinary calculi: Secondary | ICD-10-CM | POA: Diagnosis not present

## 2015-05-21 DIAGNOSIS — F321 Major depressive disorder, single episode, moderate: Secondary | ICD-10-CM | POA: Diagnosis not present

## 2015-05-21 DIAGNOSIS — Z79891 Long term (current) use of opiate analgesic: Secondary | ICD-10-CM | POA: Diagnosis not present

## 2015-05-21 DIAGNOSIS — M19071 Primary osteoarthritis, right ankle and foot: Secondary | ICD-10-CM | POA: Diagnosis not present

## 2015-05-21 DIAGNOSIS — Z79899 Other long term (current) drug therapy: Secondary | ICD-10-CM | POA: Diagnosis not present

## 2015-05-21 DIAGNOSIS — G40901 Epilepsy, unspecified, not intractable, with status epilepticus: Secondary | ICD-10-CM | POA: Diagnosis not present

## 2015-05-21 DIAGNOSIS — F419 Anxiety disorder, unspecified: Secondary | ICD-10-CM | POA: Diagnosis not present

## 2015-05-21 DIAGNOSIS — Z471 Aftercare following joint replacement surgery: Secondary | ICD-10-CM | POA: Diagnosis not present

## 2015-05-21 DIAGNOSIS — G2581 Restless legs syndrome: Secondary | ICD-10-CM | POA: Diagnosis not present

## 2015-05-24 ENCOUNTER — Ambulatory Visit: Payer: PPO | Attending: Orthopedic Surgery | Admitting: Physical Therapy

## 2015-05-24 DIAGNOSIS — M25662 Stiffness of left knee, not elsewhere classified: Secondary | ICD-10-CM | POA: Diagnosis not present

## 2015-05-24 DIAGNOSIS — M25562 Pain in left knee: Secondary | ICD-10-CM | POA: Diagnosis not present

## 2015-05-24 NOTE — Therapy (Signed)
[facility] [place], [place], [address] Phone: [phone]   Fax:  [phone]  Physical Therapy Evaluation  Patient Details  Name: [name] MRN: [id number] Date of Birth: [birth year] Referring Provider: [name] MD  Encounter Date: 05/24/2015      PT End of Session - 05/24/15 1205    PT Start Time 1105   PT Stop Time 0100   PT Time Calculation (min) 835 min   Activity Tolerance Patient tolerated treatment well   Behavior During Therapy [facility] for tasks assessed/performed      Past Medical History  Diagnosis Date  . Vertigo   . Constipation   . Restless leg   . High cholesterol   . Chronic bronchitis ([place])     "yearly; when the weather changes" (04/22/2012)  . [id number])     "1/wk" (04/22/2012)  . Depression   . Epilepsy ([place])     "been having them right often here lately" (04/22/2012)  . Seizures ([place])   . Arthritis     "knees" (04/22/2012)  . Osteoarthritis     [name] 04/22/2012  . Colon polyps     adenomatous and hyperplastic-  . Rectal bleed     in toilet- bright red  . Other convulsions 05/21/12    non-epileptic spells  . Bronchitis   . Fatty liver   . Chronic kidney disease   . Eczema     Past Surgical History  Procedure Laterality Date  . Total hip arthroplasty Left 1993; 1995; 2000  . Abdominal hysterectomy  2001  . Colonoscopy    . Joint replacement    . Mass excision  10/22/2011    Procedure: EXCISION MASS;  Surgeon: [name], MD;  Location: [place];  Service: General;  Laterality: Right;  excision right buttock mass  . Bladder suspension    . Cesarean section  1987; 1988  . Bunionectomy Left 2000  . Total knee arthroplasty Left 05/07/2015    Procedure: TOTAL LEFT KNEE ARTHROPLASTY;  Surgeon: [name], MD;  [facility];  Service: Orthopedics;  Laterality: Left;    There were no vitals filed for this visit.       Subjective Assessment - 05/24/15 1100    Subjective Thje pain goes up when I sit around.   Limitations Sitting   Pain Score 4    Pain Location Knee   Pain Orientation Left   Pain Descriptors / Indicators Aching   Pain Type Surgical pain   Pain Frequency Intermittent            [facility] PT Assessment - 05/24/15 0001    Assessment   Medical Diagnosis Left total knee replacement.   Referring Provider [name] MD   Onset Date/Surgical Date --  05/07/15(surgery date).   Hand Dominance Right   Next MD Visit --  06/12/15.   Precautions   Precaution Comments No ultrasound.  H/o seizures patient states she will always have a family member with her.  If it happens she states "just hold my head up."   Restrictions   Weight Bearing Restrictions No   Balance Screen   Has the patient fallen in the past 6 months Yes   How many times? 3 due to seizure.   Has the patient had a decrease in activity level because of a fear of falling?  Yes   Is the patient reluctant to leave their home because of a fear of falling?  No   Home Environment  Living Environment Private residence   Home Access Stairs to enter   Entrance Stairs-Number of Steps 4   Entrance Stairs-Rails Can reach both   Prior Function   Level of Independence Independent   Cognition   Behaviors --  Mild speech impediment.   Observation/Other Assessments   Observations --  Left knee incision looks good.   Observation/Other Assessments-Edema    Edema Circumferential   Circumferential Edema   Circumferential - Right 5.5 cms > (RT>LT).   ROM / Strength   AROM / PROM / Strength AROM;PROM;Strength   AROM   AROM Assessment Site Knee   Right/Left Knee Left   Left Knee Extension -12   Left Knee Flexion 92   PROM   PROM Assessment Site Knee   Right/Left Knee Left   Left Knee Extension -8   Left Knee Flexion 100   Strength   Overall Strength Comments Left hip and knee strength is a solid 4+/5.   Palpation   Palpation comment Tender to palpation over left distal  hamstrings and mild diffuse tenderness around the patient's 'eft patella.   Bed Mobility   Bed Mobility Sit to Supine   Sit to Supine 7: Independent   Transfers   Transfers Sit to Stand   Sit to Stand 7: Independent   Ambulation/Gait   Gait Pattern Right flexed knee in stance                   [facility] Adult PT Treatment/Exercise - 05/24/15 0001    Modalities   Modalities Electrical Stimulation;Vasopneumatic   Electrical Stimulation   Electrical Stimulation Location Left knee   Electrical Stimulation Action IFC   Electrical Stimulation Parameters 1-10 HZ at 100% scan x 15 minutes   Electrical Stimulation Goals Edema;Pain   Vasopneumatic   Number Minutes Vasopneumatic  15 minutes   Vasopnuematic Location  --  Left knee.   Vasopneumatic Pressure Medium   Vasopneumatic Temperature  60                  PT Short Term Goals - 05/24/15 1212    PT SHORT TERM GOAL #1   Title Ind with a initial HEP.   Time 4   Period Weeks   Status New   PT SHORT TERM GOAL #2   Title Achieve full left knee extension to help normalize her gait pattern.   Time 4   Period Weeks   Status New           PT Long Term Goals - 05/24/15 1213    PT LONG TERM GOAL #1   Title Ind with an advanced HEP.   Time 8   Period Weeks   Status New   PT LONG TERM GOAL #2   Title Decrease edema to within 2 cms of contralateral side to assist with range of motion gains and decrease pain.   Time 8   Period Weeks   Status New   PT LONG TERM GOAL #3   Title Active left knee flexion to 115 degrees+ so the patient can perform functional tasks and do so with pain not > 2-3/10.   Time 8   Period Weeks   Status New   PT LONG TERM GOAL #4   Title Perform a reciprocating stair gait with one railing with pain not > 2-3/10.   Time 8   Period Weeks   Status New   PT LONG TERM GOAL #5   Title Perform ADL's with pain not >  3/10.   Time 8   Period Weeks   Status New               Plan -  05/24/15 1205    Clinical Impression Statement The patient underwent a left total knee replacement on4/24/17.  She had some home health physical therapy and is pleased with her progress thus far and is compliant with a HEP.  Her resing pain-level is a 4/10 today and 6+/10 with standing after sitting for prolonged periods of time.   Rehab Potential Excellent   PT Frequency 3x / week   PT Duration 4 weeks   PT Treatment/Interventions ADLs/Self Care Home Management;Cryotherapy;Electrical Stimulation;Moist Heat;Therapeutic exercise;Therapeutic activities;Manual techniques;Patient/family education;Passive range of motion;Vasopneumatic Device   PT Next Visit Plan Total knee replacement protocol.  No ultrasound.  Electrical stimulation and vasopneumatic.  PROM/stretching.  STW/M to distal hamstrings   Consulted and Agree with Plan of Care Patient      Patient will benefit from skilled therapeutic intervention in order to improve the following deficits and impairments:  Pain, Decreased activity tolerance, Decreased range of motion, Increased edema  Visit Diagnosis: Pain in left knee - Plan: PT plan of care cert/re-cert  Stiffness of left knee, not elsewhere classified - Plan: PT plan of care cert/re-cert      G-Codes - [id number]    Functional Assessment Tool Used FOTO...57% limitation.   Functional Limitation Mobility: Walking and moving around   Mobility: Walking and Moving Around Current Status [phone]) At least 40 percent but less than 60 percent impaired, limited or restricted   Mobility: Walking and Moving Around Goal Status [phone]) At least 1 percent but less than 20 percent impaired, limited or restricted       Problem List Patient Active Problem List   Diagnosis Date Noted  . OA (osteoarthritis) of knee 05/07/2015  . Status epilepticus ([place]) 01/24/2015  . Altered mental status 01/23/2015  . Acute encephalopathy 01/23/2015  . Seizures ([place]) 01/23/2015  . Epilepsy without status  epilepticus, not intractable ([place]) 02/20/2014  . Memory loss 02/20/2014  . 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 Moderate major depression ([place]) 10/14/2010    APPLEGATE, [place] MPT 05/24/2015, 12:19 PM  [facility] [address] [place], [place], [address] Phone: [phone]   Fax:  [phone]  Name: BRITTIAN SAWIN MRN: [id number] Date of Birth: 09/12/1965

## 2015-05-25 ENCOUNTER — Ambulatory Visit: Payer: PPO | Admitting: Physical Therapy

## 2015-05-25 DIAGNOSIS — M25562 Pain in left knee: Secondary | ICD-10-CM

## 2015-05-25 DIAGNOSIS — M25662 Stiffness of left knee, not elsewhere classified: Secondary | ICD-10-CM

## 2015-05-25 NOTE — Therapy (Signed)
[facility] [place], [place], [address] Phone: [phone]   Fax:  [phone]  Physical Therapy Treatment  Patient Details  Name: JERMYA SIEFRING MRN: [id number] Date of Birth: 1966/01/10 Referring Provider: [name] MD  Encounter Date: 05/25/2015      PT End of Session - 05/25/15 1302    Visit Number 2   Number of Visits 16   Date for PT Re-Evaluation 08/15/15   PT Start Time 1115   PT Stop Time 1214   PT Time Calculation (min) 59 min   Activity Tolerance Patient tolerated treatment well   Behavior During Therapy [facility] for tasks assessed/performed      Past Medical History  Diagnosis Date  . Vertigo   . Constipation   . Restless leg   . High cholesterol   . Chronic bronchitis ([place])     "yearly; when the weather changes" (04/22/2012)  . [id number])     "1/wk" (04/22/2012)  . Depression   . Epilepsy ([place])     "been having them right often here lately" (04/22/2012)  . Seizures ([place])   . Arthritis     "knees" (04/22/2012)  . Osteoarthritis     [name] 04/22/2012  . Colon polyps     adenomatous and hyperplastic-  . Rectal bleed     in toilet- bright red  . Other convulsions 05/21/12    non-epileptic spells  . Bronchitis   . Fatty liver   . Chronic kidney disease   . Eczema     Past Surgical History  Procedure Laterality Date  . Total hip arthroplasty Left 1993; 1995; 2000  . Abdominal hysterectomy  2001  . Colonoscopy    . Joint replacement    . Mass excision  10/22/2011    Procedure: EXCISION MASS;  Surgeon: [name], MD;  Location: [place];  Service: General;  Laterality: Right;  excision right buttock mass  . Bladder suspension    . Cesarean section  1987; 1988  . Bunionectomy Left 2000  . Total knee arthroplasty Left 05/07/2015    Procedure: TOTAL LEFT KNEE ARTHROPLASTY;  Surgeon: [name], MD;  [facility];  Service: Orthopedics;  Laterality: Left;    There were no vitals  filed for this visit.      Subjective Assessment - 05/25/15 1303    Subjective No new complaints.   Pain Score 4    Pain Location Knee   Pain Orientation Left   Pain Descriptors / Indicators Aching   Pain Type Surgical pain   Pain Frequency Intermittent                                   PT Short Term Goals - 05/24/15 1212    PT SHORT TERM GOAL #1   Title Ind with a initial HEP.   Time 4   Period Weeks   Status New   PT SHORT TERM GOAL #2   Title Achieve full left knee extension to help normalize her gait pattern.   Time 4   Period Weeks   Status New           PT Long Term Goals - 05/24/15 1213    PT LONG TERM GOAL #1   Title Ind with an advanced HEP.   Time 8   Period Weeks   Status New   PT LONG TERM GOAL #2   Title Decrease  edema to within 2 cms of contralateral side to assist with range of motion gains and decrease pain.   Time 8   Period Weeks   Status New   PT LONG TERM GOAL #3   Title Active left knee flexion to 115 degrees+ so the patient can perform functional tasks and do so with pain not > 2-3/10.   Time 8   Period Weeks   Status New   PT LONG TERM GOAL #4   Title Perform a reciprocating stair gait with one railing with pain not > 2-3/10.   Time 8   Period Weeks   Status New   PT LONG TERM GOAL #5   Title Perform ADL's with pain not > 3/10.   Time 8   Period Weeks   Status New               Plan - 21-Jun-2015 1205    Clinical Impression Statement The patient underwent a left total knee replacement on4/24/17.  She had some home health physical therapy and is pleased with her progress thus far and is compliant with a HEP.  Her resing pain-level is a 4/10 today and 6+/10 with standing after sitting for prolonged periods of time.   Rehab Potential Excellent   PT Frequency 3x / week   PT Duration 4 weeks   PT Treatment/Interventions ADLs/Self Care Home Management;Cryotherapy;Electrical Stimulation;Moist  Heat;Therapeutic exercise;Therapeutic activities;Manual techniques;Patient/family education;Passive range of motion;Vasopneumatic Device   PT Next Visit Plan Total knee replacement protocol.  No ultrasound.  Electrical stimulation and vasopneumatic.  PROM/stretching.  STW/M to distal hamstrings   Consulted and Agree with Plan of Care Patient      Patient will benefit from skilled therapeutic intervention in order to improve the following deficits and impairments:     Visit Diagnosis: Pain in left knee  Stiffness of left knee, not elsewhere classified       G-Codes - 2015/06/21 1216    Functional Assessment Tool Used FOTO...57% limitation.   Functional Limitation Mobility: Walking and moving around   Mobility: Walking and Moving Around Current Status [phone]) At least 40 percent but less than 60 percent impaired, limited or restricted   Mobility: Walking and Moving Around Goal Status [phone]) At least 1 percent but less than 20 percent impaired, limited or restricted      Problem List Patient Active Problem List   Diagnosis Date Noted  . OA (osteoarthritis) of knee 05/07/2015  . Status epilepticus ([place]) 01/24/2015  . Altered mental status 01/23/2015  . Acute encephalopathy 01/23/2015  . Seizures ([place]) 01/23/2015  . Epilepsy without status epilepticus, not intractable ([place]) 02/20/2014  . Memory loss 02/20/2014  . 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 Moderate major depression ([place]) 10/14/2010   Treatment:  Nustep moving forward x 3 to increase flexion x 20 minutes f/b Rockerboard x 5 minutes f/b left knee PROM into flexion and extension x 13 minutes in supine f/b IFC and vasopneumatic x 15 minutes.  Patient did great today. Lennie Vasco, [place] MPT 05/25/2015, 1:16 PM  [facility] [address] [place], [place], [address] Phone: [phone]   Fax:  [phone]  Name: YASMEENA LEIPHART MRN: [id number] Date of Birth: 03-Dec-1965

## 2015-05-28 ENCOUNTER — Ambulatory Visit: Payer: PPO | Admitting: Physical Therapy

## 2015-05-28 DIAGNOSIS — M25662 Stiffness of left knee, not elsewhere classified: Secondary | ICD-10-CM

## 2015-05-28 DIAGNOSIS — M25562 Pain in left knee: Secondary | ICD-10-CM | POA: Diagnosis not present

## 2015-05-28 NOTE — Therapy (Signed)
[facility] [place], [place], [address] Phone: [phone]   Fax:  [phone]  Physical Therapy Treatment  Patient Details  Name: [name] MRN: [id number] Date of Birth: [birth year] Referring Provider: [name] MD  Encounter Date: 05/28/2015      PT End of Session - 05/28/15 1432    Visit Number 3   Number of Visits 16   Date for PT Re-Evaluation 08/15/15   PT Start Time 1430   PT Stop Time 1529   PT Time Calculation (min) 59 min   Activity Tolerance Patient tolerated treatment well   Behavior During Therapy [facility] for tasks assessed/performed      Past Medical History  Diagnosis Date  . Vertigo   . Constipation   . Restless leg   . High cholesterol   . Chronic bronchitis ([place])     "yearly; when the weather changes" (04/22/2012)  . [id number])     "1/wk" (04/22/2012)  . Depression   . Epilepsy ([place])     "been having them right often here lately" (04/22/2012)  . Seizures ([place])   . Arthritis     "knees" (04/22/2012)  . Osteoarthritis     [name] 04/22/2012  . Colon polyps     adenomatous and hyperplastic-  . Rectal bleed     in toilet- bright red  . Other convulsions 05/21/12    non-epileptic spells  . Bronchitis   . Fatty liver   . Chronic kidney disease   . Eczema     Past Surgical History  Procedure Laterality Date  . Total hip arthroplasty Left 1993; 1995; 2000  . Abdominal hysterectomy  2001  . Colonoscopy    . Joint replacement    . Mass excision  10/22/2011    Procedure: EXCISION MASS;  Surgeon: [name], MD;  Location: [place];  Service: General;  Laterality: Right;  excision right buttock mass  . Bladder suspension    . Cesarean section  1987; 1988  . Bunionectomy Left 2000  . Total knee arthroplasty Left 05/07/2015    Procedure: TOTAL LEFT KNEE ARTHROPLASTY;  Surgeon: [name], MD;  [facility];  Service: Orthopedics;  Laterality: Left;    There were no vitals  filed for this visit.      Subjective Assessment - 05/28/15 1444    Subjective No new complaints.    Currently in Pain? Yes   Pain Score 4    Pain Location Knee   Pain Orientation Left   Pain Descriptors / Indicators Aching   Pain Type Surgical pain   Pain Frequency Intermittent   Aggravating Factors  exercise   Pain Relieving Factors ice                         [facility] Adult PT Treatment/Exercise - 05/28/15 0001    Exercises   Exercises Knee/Hip   Knee/Hip Exercises: Aerobic   Nustep L3 x 15 min   Knee/Hip Exercises: Standing   Heel Raises Both;2 sets;10 reps   Forward Lunges Left;20 reps   Terminal Knee Extension Strengthening;Left;1 set;10 reps  into gray ball   Rocker Board 2 minutes  x 2 set   Knee/Hip Exercises: Seated   Sit to Sand 10 reps   Knee/Hip Exercises: Supine   Heel Slides AAROM;Left;2 sets;10 reps   Modalities   Modalities Designer, multimedia Location L knee   Chartered certified accountant  IFC   Electrical Stimulation Parameters 1-10 hz to tolerance x 15 min   Electrical Stimulation Goals Edema   Vasopneumatic   Number Minutes Vasopneumatic  15 minutes   Vasopnuematic Location  Knee   Vasopneumatic Pressure Medium   Vasopneumatic Temperature  34   Manual Therapy   Manual Therapy Passive ROM   Passive ROM into L knee extension with prolonged holds                  PT Short Term Goals - 05/28/15 1519    PT SHORT TERM GOAL #1   Title Ind with a initial HEP.   Time 4   Status On-going   PT SHORT TERM GOAL #2   Title Achieve full left knee extension to help normalize her gait pattern.   Time 4   Period Weeks   Status On-going           PT Long Term Goals - 05/24/15 1213    PT LONG TERM GOAL #1   Title Ind with an advanced HEP.   Time 8   Period Weeks   Status New   PT LONG TERM GOAL #2   Title Decrease edema to within 2 cms of contralateral  side to assist with range of motion gains and decrease pain.   Time 8   Period Weeks   Status New   PT LONG TERM GOAL #3   Title Active left knee flexion to 115 degrees+ so the patient can perform functional tasks and do so with pain not > 2-3/10.   Time 8   Period Weeks   Status New   PT LONG TERM GOAL #4   Title Perform a reciprocating stair gait with one railing with pain not > 2-3/10.   Time 8   Period Weeks   Status New   PT LONG TERM GOAL #5   Title Perform ADL's with pain not > 3/10.   Time 8   Period Weeks   Status New               Plan - 05/28/15 1516    Clinical Impression Statement Patient did well with therex today. She demos early fatigue with heel raises, TKE and heel slides. She demos considerable swelling in ankle but reports compliance with elevaton and ankle pumps. Goals are ongoing.   Rehab Potential Excellent   PT Frequency 3x / week   PT Duration 4 weeks   PT Treatment/Interventions ADLs/Self Care Home Management;Cryotherapy;Electrical Stimulation;Moist Heat;Therapeutic exercise;Therapeutic activities;Manual techniques;Patient/family education;Passive range of motion;Vasopneumatic Device   PT Next Visit Plan Total knee replacement protocol.  No ultrasound.  Electrical stimulation and vasopneumatic.  PROM/stretching.  STW/M to distal hamstrings   Consulted and Agree with Plan of Care Patient      Patient will benefit from skilled therapeutic intervention in order to improve the following deficits and impairments:  Pain, Decreased activity tolerance, Decreased range of motion, Increased edema  Visit Diagnosis: Stiffness of left knee, not elsewhere classified  Pain in left knee     Problem List Patient Active Problem List   Diagnosis Date Noted  . OA (osteoarthritis) of knee 05/07/2015  . Status epilepticus ([place]) 01/24/2015  . Altered mental status 01/23/2015  . Acute encephalopathy 01/23/2015  . Seizures ([place]) 01/23/2015  . Epilepsy  without status epilepticus, not intractable ([place]) 02/20/2014  . Memory loss 02/20/2014  . 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 Moderate major depression ([place]) 10/14/2010   Madelyn Flavors PT  05/28/2015, 3:24  PM  [facility] [place], [place], [address] Phone: [phone]   Fax:  [phone]  Name: [name] MRN: [id number] Date of Birth: [birth year]

## 2015-05-29 ENCOUNTER — Other Ambulatory Visit: Payer: Self-pay | Admitting: Family Medicine

## 2015-05-30 ENCOUNTER — Ambulatory Visit: Payer: PPO | Admitting: Physical Therapy

## 2015-05-30 ENCOUNTER — Encounter: Payer: Self-pay | Admitting: Physical Therapy

## 2015-05-30 DIAGNOSIS — M25562 Pain in left knee: Secondary | ICD-10-CM | POA: Diagnosis not present

## 2015-05-30 DIAGNOSIS — M25662 Stiffness of left knee, not elsewhere classified: Secondary | ICD-10-CM

## 2015-05-30 NOTE — Therapy (Signed)
[facility] [place], [place], [address] Phone: [phone]   Fax:  [phone]  Physical Therapy Treatment  Patient Details  Name: [name] MRN: [id number] Date of Birth: August 21, 1965 Referring Provider: [name] MD  Encounter Date: 05/30/2015      PT End of Session - 05/30/15 1150    Visit Number 4   Number of Visits 16   Date for PT Re-Evaluation 08/15/15   PT Start Time 1115   PT Stop Time 1212   PT Time Calculation (min) 57 min   Activity Tolerance Patient tolerated treatment well   Behavior During Therapy [facility] for tasks assessed/performed      Past Medical History  Diagnosis Date  . Vertigo   . Constipation   . Restless leg   . High cholesterol   . Chronic bronchitis ([place])     "yearly; when the weather changes" (04/22/2012)  . OZDGUYQI(347.4)     "1/wk" (04/22/2012)  . Depression   . Epilepsy ([place])     "been having them right often here lately" (04/22/2012)  . Seizures ([place])   . Arthritis     "knees" (04/22/2012)  . Osteoarthritis     [name] 04/22/2012  . Colon polyps     adenomatous and hyperplastic-  . Rectal bleed     in toilet- bright red  . Other convulsions 05/21/12    non-epileptic spells  . Bronchitis   . Fatty liver   . Chronic kidney disease   . Eczema     Past Surgical History  Procedure Laterality Date  . Total hip arthroplasty Left 1993; 1995; 2000  . Abdominal hysterectomy  2001  . Colonoscopy    . Joint replacement    . Mass excision  10/22/2011    Procedure: EXCISION MASS;  Surgeon: [name], MD;  Location: [place];  Service: General;  Laterality: Right;  excision right buttock mass  . Bladder suspension    . Cesarean section  1987; 1988  . Bunionectomy Left 2000  . Total knee arthroplasty Left 05/07/2015    Procedure: TOTAL LEFT KNEE ARTHROPLASTY;  Surgeon: [name], MD;  Location[facility];  Service: Orthopedics;  Laterality: Left;    There were no vitals  filed for this visit.      Subjective Assessment - 05/30/15 1123    Subjective Patient reported soreness after doing too much yesterday.   Limitations Sitting   Currently in Pain? Yes   Pain Score 6    Pain Location Knee   Pain Orientation Left   Pain Descriptors / Indicators Aching   Pain Type Surgical pain   Pain Frequency Intermittent   Aggravating Factors  ROM   Pain Relieving Factors rest and ice            [facility] PT Assessment - 05/30/15 0001    AROM   AROM Assessment Site Knee   Right/Left Knee Left   Left Knee Extension -18   Left Knee Flexion 100   PROM   PROM Assessment Site Knee   Right/Left Knee Left   Left Knee Extension -13   Left Knee Flexion 109                     [facility] Adult PT Treatment/Exercise - 05/30/15 0001    Knee/Hip Exercises: Aerobic   Nustep 92mn L4 for ROM   Knee/Hip Exercises: Standing   Rocker Board 2 minutes   EAcupuncturist  Location L knee   Electrical Stimulation Action IFC   Electrical Stimulation Parameters 1-10hz   Electrical Stimulation Goals Edema   Vasopneumatic   Number Minutes Vasopneumatic  15 minutes   Vasopnuematic Location  Knee   Vasopneumatic Pressure Medium   Manual Therapy   Manual Therapy Passive ROM   Passive ROM gentle PROM/stretching for left knee flex/ext with low load holds                PT Education - 05/30/15 1152    Education provided Yes   Education Details HEP   Person(s) Educated Patient   Methods Explanation;Demonstration;Handout   Comprehension Verbalized understanding;Returned demonstration          PT Short Term Goals - 05/30/15 1152    PT SHORT TERM GOAL #1   Title Ind with a initial HEP.   Time 4   Period Weeks   Status Achieved   PT SHORT TERM GOAL #2   Title Achieve full left knee extension to help normalize her gait pattern.   Time 4   Period Weeks   Status On-going           PT Long Term Goals - 05/24/15 1213     PT LONG TERM GOAL #1   Title Ind with an advanced HEP.   Time 8   Period Weeks   Status New   PT LONG TERM GOAL #2   Title Decrease edema to within 2 cms of contralateral side to assist with range of motion gains and decrease pain.   Time 8   Period Weeks   Status New   PT LONG TERM GOAL #3   Title Active left knee flexion to 115 degrees+ so the patient can perform functional tasks and do so with pain not > 2-3/10.   Time 8   Period Weeks   Status New   PT LONG TERM GOAL #4   Title Perform a reciprocating stair gait with one railing with pain not > 2-3/10.   Time 8   Period Weeks   Status New   PT LONG TERM GOAL #5   Title Perform ADL's with pain not > 3/10.   Time 8   Period Weeks   Status New               Plan - 05/30/15 1152    Clinical Impression Statement Patient progressing with improved left knee flexion ROM today yet knee ext has not improved. Patient has increased edema and has reported increased edema after prolong standing yesterday. Patient understands HEP given today. STG #1 met , LTG's ongoing due to ROM, edema, strentgth and pain deficits.   Rehab Potential Excellent   PT Frequency 3x / week   PT Duration 4 weeks   PT Treatment/Interventions ADLs/Self Care Home Management;Cryotherapy;Electrical Stimulation;Moist Heat;Therapeutic exercise;Therapeutic activities;Manual techniques;Patient/family education;Passive range of motion;Vasopneumatic Device   PT Next Visit Plan Total knee replacement protocol.  No ultrasound.  Electrical stimulation and vasopneumatic.  PROM/stretching esp ext.  STW/M to distal hamstrings (MD.Alusio 06/12/15)   Consulted and Agree with Plan of Care Patient      Patient will benefit from skilled therapeutic intervention in order to improve the following deficits and impairments:  Pain, Decreased activity tolerance, Decreased range of motion, Increased edema  Visit Diagnosis: Stiffness of left knee, not elsewhere  classified  Pain in left knee     Problem List Patient Active Problem List   Diagnosis Date Noted  . OA (osteoarthritis) of knee  05/07/2015  . Status epilepticus ([place]) 01/24/2015  . Altered mental status 01/23/2015  . Acute encephalopathy 01/23/2015  . Seizures ([place]) 01/23/2015  . Epilepsy without status epilepticus, not intractable ([place]) 02/20/2014  . Memory loss 02/20/2014  . 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 Moderate major depression ([place]) 10/14/2010    Kimbella Heisler P, PTA 05/30/2015, 12:12 PM  [facility] [address] [place], [place], [address] Phone: [phone]   Fax:  [phone]  Name: RADIE BERGES MRN: [id number] Date of Birth: [birth year]

## 2015-05-30 NOTE — Patient Instructions (Signed)
Knee Extension Mobilization: Towel Prop   With rolled towel under right ankle, place _1-5___ pound weight across knee. Hold __5+__ minutes. Repeat __2-3__ times per set. Do __2__ sets per session. Do __2-4__ sessions per day.  Sitting knee extension stretch    Place one foot on table. Straighten leg and attempt to keep it straight, then push down until feel a stretch. Hold _30__ seconds. Repeat __5-10_ times each leg, alternating. Do _2-4__ sessions per day.      Knee Flexion Stretch on Step  Place foot on step and lean forward until you feel a good stretch in front of knee.   hold 30 sec x 5-10 perform 2-4 x daily

## 2015-05-31 ENCOUNTER — Ambulatory Visit: Payer: PPO | Admitting: Physical Therapy

## 2015-05-31 DIAGNOSIS — M25562 Pain in left knee: Secondary | ICD-10-CM | POA: Diagnosis not present

## 2015-05-31 DIAGNOSIS — M25662 Stiffness of left knee, not elsewhere classified: Secondary | ICD-10-CM

## 2015-05-31 NOTE — Therapy (Signed)
[facility] [place], [place], [address] Phone: [phone]   Fax:  [phone]  Physical Therapy Treatment  Patient Details  Name: [name] MRN: [id number] Date of Birth: [birth year] Referring Provider: [name] MD  Encounter Date: 05/31/2015      PT End of Session - 05/30/15 1150    Visit Number 4   Number of Visits 16   Date for PT Re-Evaluation 08/15/15   PT Start Time 1115   PT Stop Time 1212   PT Time Calculation (min) 57 min   Activity Tolerance Patient tolerated treatment well   Behavior During Therapy [facility] for tasks assessed/performed      Past Medical History  Diagnosis Date  . Vertigo   . Constipation   . Restless leg   . High cholesterol   . Chronic bronchitis ([place])     "yearly; when the weather changes" (04/22/2012)  . RWERXVQM(086.7)     "1/wk" (04/22/2012)  . Depression   . Epilepsy ([place])     "been having them right often here lately" (04/22/2012)  . Seizures ([place])   . Arthritis     "knees" (04/22/2012)  . Osteoarthritis     [name] 04/22/2012  . Colon polyps     adenomatous and hyperplastic-  . Rectal bleed     in toilet- bright red  . Other convulsions 05/21/12    non-epileptic spells  . Bronchitis   . Fatty liver   . Chronic kidney disease   . Eczema     Past Surgical History  Procedure Laterality Date  . Total hip arthroplasty Left 1993; 1995; 2000  . Abdominal hysterectomy  2001  . Colonoscopy    . Joint replacement    . Mass excision  10/22/2011    Procedure: EXCISION MASS;  Surgeon: [name], MD;  Location: [place];  Service: General;  Laterality: Right;  excision right buttock mass  . Bladder suspension    . Cesarean section  1987; 1988  . Bunionectomy Left 2000  . Total knee arthroplasty Left 05/07/2015    Procedure: TOTAL LEFT KNEE ARTHROPLASTY;  Surgeon: [name], MD;  [facility];  Service: Orthopedics;  Laterality: Left;    There were no vitals  filed for this visit.      Subjective Assessment - 05/31/15 1226    Subjective I was real sore and had trouble sleeping last night.  A family member place a dumbbell on my leg.  I recommended she obtain a ankle weight instead.   Limitations Sitting   Pain Score 4    Pain Location Knee   Pain Orientation Left   Pain Descriptors / Indicators Aching   Pain Type Surgical pain   Pain Frequency Intermittent                         [facility] Adult PT Treatment/Exercise - 05/31/15 0001    Exercises   Exercises Ankle   Knee/Hip Exercises: Aerobic   Nustep 15 minutes.   Knee/Hip Exercises: Standing   Rocker Board --  7 minutes.   Knee/Hip Exercises: Supine   Other Supine Knee/Hip Exercises 5# overpressure x 4 minutes to patient's left knee in supine.   Acupuncturist Location Left knee.   Electrical Stimulation Action IFC   Electrical Stimulation Parameters 1-10 HZ   Electrical Stimulation Goals Edema   Vasopneumatic   Number Minutes Vasopneumatic  14 minutes   Vasopnuematic  Location  --  Left knee.   Vasopneumatic Pressure Medium   Manual Therapy   Manual Therapy Passive ROM   Passive ROM 12 minutes into left knee passive flexion and extension.                PT Education - 05/30/15 1152    Education provided Yes   Education Details HEP   Person(s) Educated Patient   Methods Explanation;Demonstration;Handout   Comprehension Verbalized understanding;Returned demonstration          PT Short Term Goals - 05/30/15 1152    PT SHORT TERM GOAL #1   Title Ind with a initial HEP.   Time 4   Period Weeks   Status Achieved   PT SHORT TERM GOAL #2   Title Achieve full left knee extension to help normalize her gait pattern.   Time 4   Period Weeks   Status On-going           PT Long Term Goals - 05/24/15 1213    PT LONG TERM GOAL #1   Title Ind with an advanced HEP.   Time 8   Period Weeks   Status New   PT  LONG TERM GOAL #2   Title Decrease edema to within 2 cms of contralateral side to assist with range of motion gains and decrease pain.   Time 8   Period Weeks   Status New   PT LONG TERM GOAL #3   Title Active left knee flexion to 115 degrees+ so the patient can perform functional tasks and do so with pain not > 2-3/10.   Time 8   Period Weeks   Status New   PT LONG TERM GOAL #4   Title Perform a reciprocating stair gait with one railing with pain not > 2-3/10.   Time 8   Period Weeks   Status New   PT LONG TERM GOAL #5   Title Perform ADL's with pain not > 3/10.   Time 8   Period Weeks   Status New               Plan - 05/30/15 1152    Clinical Impression Statement Patient progressing with improved left knee flexion ROM today yet knee ext has not improved. Patient has increased edema and has reported increased edema after prolong standing yesterday. Patient understands HEP given today. STG #1 met , LTG's ongoing due to ROM, edema, strentgth and pain deficits.   Rehab Potential Excellent   PT Frequency 3x / week   PT Duration 4 weeks   PT Treatment/Interventions ADLs/Self Care Home Management;Cryotherapy;Electrical Stimulation;Moist Heat;Therapeutic exercise;Therapeutic activities;Manual techniques;Patient/family education;Passive range of motion;Vasopneumatic Device   PT Next Visit Plan Total knee replacement protocol.  No ultrasound.  Electrical stimulation and vasopneumatic.  PROM/stretching esp ext.  STW/M to distal hamstrings (MD.Alusio 06/12/15)   Consulted and Agree with Plan of Care Patient      Patient will benefit from skilled therapeutic intervention in order to improve the following deficits and impairments:     Visit Diagnosis: Stiffness of left knee, not elsewhere classified  Pain in left knee     Problem List Patient Active Problem List   Diagnosis Date Noted  . OA (osteoarthritis) of knee 05/07/2015  . Status epilepticus ([place]) 01/24/2015  .  Altered mental status 01/23/2015  . Acute encephalopathy 01/23/2015  . Seizures ([place]) 01/23/2015  . Epilepsy without status epilepticus, not intractable ([place]) 02/20/2014  . Memory loss 02/20/2014  . 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  [place]) 08/17/2013  . Moderate major depression ([place]) 10/14/2010    Tess Potts, [place] MPT 05/31/2015, 12:54 PM  [facility] [address] [place], [place], [address] Phone: [phone]   Fax:  [phone]  Name: [name] MRN: [id number] Date of Birth: December 30, 1965

## 2015-06-04 ENCOUNTER — Encounter: Payer: PPO | Admitting: Physical Therapy

## 2015-06-06 ENCOUNTER — Ambulatory Visit: Payer: PPO | Admitting: Physical Therapy

## 2015-06-06 ENCOUNTER — Encounter: Payer: Self-pay | Admitting: Physical Therapy

## 2015-06-06 DIAGNOSIS — M25662 Stiffness of left knee, not elsewhere classified: Secondary | ICD-10-CM

## 2015-06-06 DIAGNOSIS — M25562 Pain in left knee: Secondary | ICD-10-CM

## 2015-06-06 NOTE — Therapy (Signed)
[facility] [place], [place], [address] Phone: [phone]   Fax:  [phone]  Physical Therapy Treatment  Patient Details  Name: [name] MRN: [id number] Date of Birth: September 12, 1965 Referring Provider: [name] MD  Encounter Date: 06/06/2015      PT End of Session - 06/06/15 1350    Visit Number 5   Number of Visits 16   Date for PT Re-Evaluation 08/15/15   PT Start Time [id number]   PT Stop Time 1438   PT Time Calculation (min) 53 min   Activity Tolerance Patient tolerated treatment well   Behavior During Therapy [facility] for tasks assessed/performed      Past Medical History  Diagnosis Date  . Vertigo   . Constipation   . Restless leg   . High cholesterol   . Chronic bronchitis ([place])     "yearly; when the weather changes" (04/22/2012)  . [id number])     "1/wk" (04/22/2012)  . Depression   . Epilepsy ([place])     "been having them right often here lately" (04/22/2012)  . Seizures ([place])   . Arthritis     "knees" (04/22/2012)  . Osteoarthritis     [name] 04/22/2012  . Colon polyps     adenomatous and hyperplastic-  . Rectal bleed     in toilet- bright red  . Other convulsions 05/21/12    non-epileptic spells  . Bronchitis   . Fatty liver   . Chronic kidney disease   . Eczema     Past Surgical History  Procedure Laterality Date  . Total hip arthroplasty Left 1993; 1995; 2000  . Abdominal hysterectomy  2001  . Colonoscopy    . Joint replacement    . Mass excision  10/22/2011    Procedure: EXCISION MASS;  Surgeon: [name], MD;  Location: [place];  Service: General;  Laterality: Right;  excision right buttock mass  . Bladder suspension    . Cesarean section  1987; 1988  . Bunionectomy Left 2000  . Total knee arthroplasty Left 05/07/2015    Procedure: TOTAL LEFT KNEE ARTHROPLASTY;  Surgeon: [name], MD;  [facility];  Service: Orthopedics;  Laterality: Left;    There were no vitals  filed for this visit.      Subjective Assessment - 06/06/15 1349    Subjective Reports soreness in bilateral knees and reports continues difficulty in getting out of the car. Reports that she has been doing a lot of riding and going to MD appointments recently and also reports that she experienced cramping sensation in LLE.   Limitations Sitting   Currently in Pain? Yes   Pain Score 4    Pain Location Knee   Pain Orientation Left   Pain Descriptors / Indicators Sore   Pain Type Surgical pain            [facility] PT Assessment - 06/06/15 0001    Assessment   Medical Diagnosis Left total knee replacement.   Onset Date/Surgical Date 05/07/15   Next MD Visit 06/12/2015   Precautions   Precaution Comments No ultrasound.  H/o seizures patient states she will always have a family member with her.  If it happens she states "just hold my head up."   Restrictions   Weight Bearing Restrictions No                     [facility] Adult PT Treatment/Exercise - 06/06/15 0001    Knee/Hip  Exercises: Aerobic   Nustep L5 x15 min   Knee/Hip Exercises: Standing   Terminal Knee Extension Strengthening;Left;2 sets;10 reps;Theraband   Theraband Level (Terminal Knee Extension) Level 2 (Red)   Forward Step Up 3 sets;10 reps;Hand Hold: 2;Step Height: 6";Left   Rocker Board 2 minutes   Knee/Hip Exercises: Seated   Long Arc Quad Strengthening;Left;3 sets;10 reps;Weights   Long Arc Quad Weight 3 lbs.   Modalities   Modalities Network engineer Stimulation Location Left knee.   Electrical Stimulation Action IFC   Electrical Stimulation Parameters 1-10 Hz x15 min   Electrical Stimulation Goals Pain;Edema   Vasopneumatic   Number Minutes Vasopneumatic  15 minutes   Vasopnuematic Location  Knee   Vasopneumatic Pressure Medium   Vasopneumatic Temperature  34   Manual Therapy   Manual Therapy Passive ROM   Passive ROM PROM of L knee into  flexion/ext with gentle holds at end range                  PT Short Term Goals - 05/30/15 1152    PT SHORT TERM GOAL #1   Title Ind with a initial HEP.   Time 4   Period Weeks   Status Achieved   PT SHORT TERM GOAL #2   Title Achieve full left knee extension to help normalize her gait pattern.   Time 4   Period Weeks   Status On-going           PT Long Term Goals - 05/24/15 1213    PT LONG TERM GOAL #1   Title Ind with an advanced HEP.   Time 8   Period Weeks   Status New   PT LONG TERM GOAL #2   Title Decrease edema to within 2 cms of contralateral side to assist with range of motion gains and decrease pain.   Time 8   Period Weeks   Status New   PT LONG TERM GOAL #3   Title Active left knee flexion to 115 degrees+ so the patient can perform functional tasks and do so with pain not > 2-3/10.   Time 8   Period Weeks   Status New   PT LONG TERM GOAL #4   Title Perform a reciprocating stair gait with one railing with pain not > 2-3/10.   Time 8   Period Weeks   Status New   PT LONG TERM GOAL #5   Title Perform ADL's with pain not > 3/10.   Time 8   Period Weeks   Status New               Plan - 06/06/15 1424    Clinical Impression Statement Patient continues to tolerate treatment well although she requires minimal to moderate multimodal cueing for corrections for exercise technique. Patient has difficulty with TKE technique with red theraband. Firm end feels noted with PROM of L knee into flexion/extension in supine. Patient's L knee incision moves smoothly although very slightly restricted in the inferior aspects of incision.  Normal modalties response noted following removal of the modalites. Reports no difficulty with stair ambulation and reports that bending her knee and picking it up to get out of car is the hardest.   Rehab Potential Excellent   PT Frequency 3x / week   PT Duration 4 weeks   PT Treatment/Interventions ADLs/Self Care Home  Management;Cryotherapy;Electrical Stimulation;Moist Heat;Therapeutic exercise;Therapeutic activities;Manual techniques;Patient/family education;Passive range of motion;Vasopneumatic Device   PT Next  Visit Plan Continue per L TKR protocol with modalties PRN for pain/edema per MTP POC. Reassess ROM and stair ambulation next treatment.   Consulted and Agree with Plan of Care Patient      Patient will benefit from skilled therapeutic intervention in order to improve the following deficits and impairments:  Pain, Decreased activity tolerance, Decreased range of motion, Increased edema  Visit Diagnosis: Stiffness of left knee, not elsewhere classified  Pain in left knee     Problem List Patient Active Problem List   Diagnosis Date Noted  . OA (osteoarthritis) of knee 05/07/2015  . Status epilepticus ([place]) 01/24/2015  . Altered mental status 01/23/2015  . Acute encephalopathy 01/23/2015  . Seizures ([place]) 01/23/2015  . Epilepsy without status epilepticus, not intractable ([place]) 02/20/2014  . Memory loss 02/20/2014  . 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 Moderate major depression ([place]) 10/14/2010    Wynelle Fanny, PTA 06/06/2015, 2:41 PM  [facility] [place], [place], [address] Phone: [phone]   Fax:  [phone]  Name: KASHINA NASH MRN: [id number] Date of Birth: [birth year]

## 2015-06-07 ENCOUNTER — Ambulatory Visit: Payer: PPO | Admitting: Physical Therapy

## 2015-06-07 DIAGNOSIS — M25562 Pain in left knee: Secondary | ICD-10-CM | POA: Diagnosis not present

## 2015-06-07 DIAGNOSIS — M25662 Stiffness of left knee, not elsewhere classified: Secondary | ICD-10-CM

## 2015-06-07 NOTE — Therapy (Signed)
[facility] [place], [place], [address] Phone: [phone]   Fax:  [phone]  Physical Therapy Treatment  Patient Details  Name: NEOSHA SHIMEL MRN: [id number] Date of Birth: 1965-08-07 Referring Provider: [name] MD  Encounter Date: 06/07/2015      PT End of Session - 06/07/15 1111    Visit Number 6   Number of Visits 16   Date for PT Re-Evaluation 08/15/15   PT Start Time 1115   PT Stop Time 1216   PT Time Calculation (min) 61 min   Activity Tolerance Patient tolerated treatment well;Patient limited by pain      Past Medical History  Diagnosis Date  . Vertigo   . Constipation   . Restless leg   . High cholesterol   . Chronic bronchitis ([place])     "yearly; when the weather changes" (04/22/2012)  . [id number])     "1/wk" (04/22/2012)  . Depression   . Epilepsy ([place])     "been having them right often here lately" (04/22/2012)  . Seizures ([place])   . Arthritis     "knees" (04/22/2012)  . Osteoarthritis     [name] 04/22/2012  . Colon polyps     adenomatous and hyperplastic-  . Rectal bleed     in toilet- bright red  . Other convulsions 05/21/12    non-epileptic spells  . Bronchitis   . Fatty liver   . Chronic kidney disease   . Eczema     Past Surgical History  Procedure Laterality Date  . Total hip arthroplasty Left 1993; 1995; 2000  . Abdominal hysterectomy  2001  . Colonoscopy    . Joint replacement    . Mass excision  10/22/2011    Procedure: EXCISION MASS;  Surgeon: [name], MD;  Location: [place];  Service: General;  Laterality: Right;  excision right buttock mass  . Bladder suspension    . Cesarean section  1987; 1988  . Bunionectomy Left 2000  . Total knee arthroplasty Left 05/07/2015    Procedure: TOTAL LEFT KNEE ARTHROPLASTY;  Surgeon: [name], MD;  Location[facility];  Service: Orthopedics;  Laterality: Left;    There were no vitals filed for this visit.       Subjective Assessment - 06/07/15 1111    Subjective Patient states she still is having cramping in her leg (she indicates anterior tib).   Limitations Sitting   Currently in Pain? Yes   Pain Score 7    Pain Location Knee   Pain Orientation Left   Pain Descriptors / Indicators Cramping   Pain Type Surgical pain   Pain Onset 1 to 4 weeks ago   Pain Frequency Intermittent   Aggravating Factors  ROM   Pain Relieving Factors rest and ice            [facility] PT Assessment - 06/07/15 0001    AROM   Left Knee Extension -15   Left Knee Flexion 104   PROM   Left Knee Extension -9   Left Knee Flexion 109                     [facility] Adult PT Treatment/Exercise - 06/07/15 0001    Knee/Hip Exercises: Stretches   Active Hamstring Stretch 5 reps  2 sets with strap and active knee extension   Passive Hamstring Stretch Left;30 seconds   Knee/Hip Exercises: Aerobic   Nustep L5 x15 min   Modalities  Modalities Designer, multimedia Location L knee   Electrical Stimulation Action IFC   Electrical Stimulation Parameters 80-150 Hz to tolerance x 15 min   Electrical Stimulation Goals Pain   Vasopneumatic   Number Minutes Vasopneumatic  15 minutes   Vasopnuematic Location  Knee   Vasopneumatic Pressure Medium   Vasopneumatic Temperature  50   Manual Therapy   Manual Therapy Soft tissue mobilization;Joint mobilization;Passive ROM;Myofascial release   Joint Mobilization to knee for flexion and extension; to patella sup/inf and med/lat   Soft tissue mobilization to left ant tib and peroneals and distal soleus; also distal HS   Myofascial Release to Left ITB in [facility]   Passive ROM passive stretch in to ext in prone; flex in hooklying                PT Education - 06/07/15 1211    Education provided Yes   Education Details HEP   Person(s) Educated Patient   Methods Explanation;Demonstration;Handout    Comprehension Verbalized understanding;Returned demonstration          PT Short Term Goals - 05/30/15 1152    PT SHORT TERM GOAL #1   Title Ind with a initial HEP.   Time 4   Period Weeks   Status Achieved   PT SHORT TERM GOAL #2   Title Achieve full left knee extension to help normalize her gait pattern.   Time 4   Period Weeks   Status On-going           PT Long Term Goals - 05/24/15 1213    PT LONG TERM GOAL #1   Title Ind with an advanced HEP.   Time 8   Period Weeks   Status New   PT LONG TERM GOAL #2   Title Decrease edema to within 2 cms of contralateral side to assist with range of motion gains and decrease pain.   Time 8   Period Weeks   Status New   PT LONG TERM GOAL #3   Title Active left knee flexion to 115 degrees+ so the patient can perform functional tasks and do so with pain not > 2-3/10.   Time 8   Period Weeks   Status New   PT LONG TERM GOAL #4   Title Perform a reciprocating stair gait with one railing with pain not > 2-3/10.   Time 8   Period Weeks   Status New   PT LONG TERM GOAL #5   Title Perform ADL's with pain not > 3/10.   Time 8   Period Weeks   Status New               Plan - 06/07/15 1211    Clinical Impression Statement Patient tolerated manual therapy well today. She has marked pain at lateral L knee and post knee, moderate tightness in ant/lat lower leg and soleus muscles. Extension has improved slightly. Issued prone knee hang. Goals are ongoing. Decreased pain to 4/10 at end of treatment.   Rehab Potential Excellent   PT Frequency 3x / week   PT Duration 4 weeks   PT Treatment/Interventions ADLs/Self Care Home Management;Cryotherapy;Electrical Stimulation;Moist Heat;Therapeutic exercise;Therapeutic activities;Manual techniques;Patient/family education;Passive range of motion;Vasopneumatic Device   PT Next Visit Plan Continue per L TKR protocol with modalties PRN for pain/edema per MTP POC. Reassess  stair ambulation  next treatment.   Consulted and Agree with Plan of Care Patient      Patient  will benefit from skilled therapeutic intervention in order to improve the following deficits and impairments:  Pain, Decreased activity tolerance, Decreased range of motion, Increased edema  Visit Diagnosis: Stiffness of left knee, not elsewhere classified  Pain in left knee     Problem List Patient Active Problem List   Diagnosis Date Noted  . OA (osteoarthritis) of knee 05/07/2015  . Status epilepticus ([place]) 01/24/2015  . Altered mental status 01/23/2015  . Acute encephalopathy 01/23/2015  . Seizures ([place]) 01/23/2015  . Epilepsy without status epilepticus, not intractable ([place]) 02/20/2014  . Memory loss 02/20/2014  . 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 Moderate major depression ([place]) 10/14/2010    Madelyn Flavors PT  06/07/2015, 12:18 PM  [facility] [address] [place], [place], [address] Phone: [phone]   Fax:  [phone]  Name: MARYSIA CHALOUPKA MRN: [id number] Date of Birth: 03-01-1965

## 2015-06-12 DIAGNOSIS — Z471 Aftercare following joint replacement surgery: Secondary | ICD-10-CM | POA: Diagnosis not present

## 2015-06-12 DIAGNOSIS — Z96652 Presence of left artificial knee joint: Secondary | ICD-10-CM | POA: Diagnosis not present

## 2015-06-13 ENCOUNTER — Encounter: Payer: Self-pay | Admitting: Physical Therapy

## 2015-06-13 ENCOUNTER — Ambulatory Visit: Payer: PPO | Admitting: Physical Therapy

## 2015-06-13 DIAGNOSIS — M25562 Pain in left knee: Secondary | ICD-10-CM

## 2015-06-13 DIAGNOSIS — M25662 Stiffness of left knee, not elsewhere classified: Secondary | ICD-10-CM

## 2015-06-13 NOTE — Therapy (Signed)
[facility] [place], [place], [address] Phone: [phone]   Fax:  [phone]  Physical Therapy Treatment  Patient Details  Name: [name] MRN: [id number] Date of Birth: [birth year] Referring Provider: [name] MD  Encounter Date: 06/13/2015      PT End of Session - 06/13/15 1120    Visit Number 7   Number of Visits 16   Date for PT Re-Evaluation 08/15/15   PT Start Time 1118   PT Stop Time 1208   PT Time Calculation (min) 50 min   Activity Tolerance Patient tolerated treatment well   Behavior During Therapy [facility] for tasks assessed/performed      Past Medical History  Diagnosis Date  . Vertigo   . Constipation   . Restless leg   . High cholesterol   . Chronic bronchitis ([place])     "yearly; when the weather changes" (04/22/2012)  . [id number])     "1/wk" (04/22/2012)  . Depression   . Epilepsy ([place])     "been having them right often here lately" (04/22/2012)  . Seizures ([place])   . Arthritis     "knees" (04/22/2012)  . Osteoarthritis     [name] 04/22/2012  . Colon polyps     adenomatous and hyperplastic-  . Rectal bleed     in toilet- bright red  . Other convulsions 05/21/12    non-epileptic spells  . Bronchitis   . Fatty liver   . Chronic kidney disease   . Eczema     Past Surgical History  Procedure Laterality Date  . Total hip arthroplasty Left 1993; 1995; 2000  . Abdominal hysterectomy  2001  . Colonoscopy    . Joint replacement    . Mass excision  10/22/2011    Procedure: EXCISION MASS;  Surgeon: [name], MD;  Location: [place];  Service: General;  Laterality: Right;  excision right buttock mass  . Bladder suspension    . Cesarean section  1987; 1988  . Bunionectomy Left 2000  . Total knee arthroplasty Left 05/07/2015    Procedure: TOTAL LEFT KNEE ARTHROPLASTY;  Surgeon: [name], MD;  [facility];  Service: Orthopedics;  Laterality: Left;    There were no vitals  filed for this visit.      Subjective Assessment - 06/13/15 1118    Subjective Reports that she is sore from working in her flowerbeds yesterday. Reports that it is getting easier for her to get in and out of a car. Reports that she saw MD yesterday and he said for two more weeks of therapy.   Limitations Sitting   Currently in Pain? Yes   Pain Score 4    Pain Location Knee   Pain Orientation Left   Pain Descriptors / Indicators Sore   Pain Type Surgical pain   Pain Onset 1 to 4 weeks ago            [facility] PT Assessment - 06/13/15 0001    Assessment   Medical Diagnosis Left total knee replacement.   Onset Date/Surgical Date 05/07/15   Next MD Visit 06/2015   Precautions   Precaution Comments No ultrasound.  H/o seizures patient states she will always have a family member with her.  If it happens she states "just hold my head up."   Restrictions   Weight Bearing Restrictions No   ROM / Strength   AROM / PROM / Strength AROM   AROM   Overall AROM  Deficits   AROM Assessment Site Knee   Right/Left Knee Left   Left Knee Extension -7   Left Knee Flexion 111                     [facility] Adult PT Treatment/Exercise - 06/13/15 0001    Knee/Hip Exercises: Aerobic   Nustep L5 x12 min   Knee/Hip Exercises: Standing   Forward Lunges Left;2 sets;10 reps  6" step   Lateral Step Up Left;3 sets;10 reps;Hand Hold: 2;Step Height: 6"   Forward Step Up Left;3 sets;10 reps;Hand Hold: 2;Step Height: 6"   Rocker Board 3 minutes   Knee/Hip Exercises: Seated   Long Arc Quad Strengthening;Left;3 sets;10 reps;Weights   Long Arc Quad Weight 4 lbs.   Modalities   Modalities Designer, multimedia Location L knee   Electrical Stimulation Action IFC   Electrical Stimulation Parameters 1-10 Hz x15 min   Electrical Stimulation Goals Pain;Edema   Vasopneumatic   Number Minutes Vasopneumatic  15 minutes   Vasopnuematic  Location  Knee   Vasopneumatic Pressure Medium   Vasopneumatic Temperature  34                  PT Short Term Goals - 06/13/15 1154    PT SHORT TERM GOAL #1   Title Ind with a initial HEP.   Time 4   Period Weeks   Status Achieved   PT SHORT TERM GOAL #2   Title Achieve full left knee extension to help normalize her gait pattern.   Time 4   Period Weeks   Status On-going  AROM L knee ext -7 deg from neutral 06/13/2015           PT Long Term Goals - 06/13/15 1153    PT LONG TERM GOAL #1   Title Ind with an advanced HEP.   Time 8   Period Weeks   Status On-going   PT LONG TERM GOAL #2   Title Decrease edema to within 2 cms of contralateral side to assist with range of motion gains and decrease pain.   Time 8   Period Weeks   Status On-going   PT LONG TERM GOAL #3   Title Active left knee flexion to 115 degrees+ so the patient can perform functional tasks and do so with pain not > 2-3/10.   Time 8   Period Weeks   Status On-going  AROM L knee flexion 111 deg 06/13/2015   PT LONG TERM GOAL #4   Title Perform a reciprocating stair gait with one railing with pain not > 2-3/10.   Time 8   Period Weeks   Status On-going   PT LONG TERM GOAL #5   Title Perform ADL's with pain not > 3/10.   Time 8   Period Weeks   Status On-going               Plan - 06/13/15 1155    Clinical Impression Statement Patient continues to improve in PT and is improving in regards to L knee ROM and strength. Patient also reports that she is able to complete functional movements such as in and out of car. Patient's AROM L knee measured as 7-111 deg today in supine. Patient able to complete forward and lateral step ups well although a slight weakness noted with step ups. Normal modalities response noted following removal of the modalities.   Rehab Potential Excellent   PT  Frequency 3x / week   PT Duration 4 weeks   PT Treatment/Interventions ADLs/Self Care Home  Management;Cryotherapy;Electrical Stimulation;Moist Heat;Therapeutic exercise;Therapeutic activities;Manual techniques;Patient/family education;Passive range of motion;Vasopneumatic Device   PT Next Visit Plan Continue per L TKR protocol with modalties PRN for pain/edema per MTP POC.    Consulted and Agree with Plan of Care Patient      Patient will benefit from skilled therapeutic intervention in order to improve the following deficits and impairments:  Pain, Decreased activity tolerance, Decreased range of motion, Increased edema  Visit Diagnosis: Stiffness of left knee, not elsewhere classified  Pain in left knee     Problem List Patient Active Problem List   Diagnosis Date Noted  . OA (osteoarthritis) of knee 05/07/2015  . Status epilepticus ([place]) 01/24/2015  . Altered mental status 01/23/2015  . Acute encephalopathy 01/23/2015  . Seizures ([place]) 01/23/2015  . Epilepsy without status epilepticus, not intractable ([place]) 02/20/2014  . Memory loss 02/20/2014  . 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 Moderate major depression ([place]) 10/14/2010    Wynelle Fanny, PTA 06/13/2015, 12:10 PM  [facility] [address] [place], [place], [address] Phone: [phone]   Fax:  [phone]  Name: [name] MRN: [id number] Date of Birth: October 21, 1965

## 2015-06-14 ENCOUNTER — Telehealth: Payer: Self-pay

## 2015-06-14 NOTE — Telephone Encounter (Signed)
Received a fax from Dr. Peri Maris office requesting clearance from Dr. Brett Fairy for pt's right TKA.  Dr. Brett Fairy cleared pt for surgery but recommended pt take keppra and lamictal before midnight PO/give 1g keppra IV on day of surgery and to resume all oral meds on the evening of surgery. Faxed back to Dr. Peri Maris office. Copy sent to our MR.

## 2015-06-18 ENCOUNTER — Encounter: Payer: Self-pay | Admitting: Physical Therapy

## 2015-06-18 ENCOUNTER — Ambulatory Visit: Payer: PPO | Attending: Orthopedic Surgery | Admitting: Physical Therapy

## 2015-06-18 DIAGNOSIS — M25662 Stiffness of left knee, not elsewhere classified: Secondary | ICD-10-CM | POA: Insufficient documentation

## 2015-06-18 DIAGNOSIS — M25562 Pain in left knee: Secondary | ICD-10-CM

## 2015-06-18 NOTE — Therapy (Signed)
[facility] [place], [place], [address] Phone: [phone]   Fax:  [phone]  Physical Therapy Treatment  Patient Details  Name: [name] MRN: [id number] Date of Birth: [birth year] Referring Provider: [name] MD  Encounter Date: 06/18/2015      PT End of Session - 06/18/15 1116    Visit Number 8   Number of Visits 16   Date for PT Re-Evaluation 08/15/15   PT Start Time 1116   PT Stop Time 1159   PT Time Calculation (min) 43 min   Activity Tolerance Patient tolerated treatment well   Behavior During Therapy [facility] for tasks assessed/performed      Past Medical History  Diagnosis Date  . Vertigo   . Constipation   . Restless leg   . High cholesterol   . Chronic bronchitis ([place])     "yearly; when the weather changes" (04/22/2012)  . [id number])     "1/wk" (04/22/2012)  . Depression   . Epilepsy ([place])     "been having them right often here lately" (04/22/2012)  . Seizures ([place])   . Arthritis     "knees" (04/22/2012)  . Osteoarthritis     [name] 04/22/2012  . Colon polyps     adenomatous and hyperplastic-  . Rectal bleed     in toilet- bright red  . Other convulsions 05/21/12    non-epileptic spells  . Bronchitis   . Fatty liver   . Chronic kidney disease   . Eczema     Past Surgical History  Procedure Laterality Date  . Total hip arthroplasty Left 1993; 1995; 2000  . Abdominal hysterectomy  2001  . Colonoscopy    . Joint replacement    . Mass excision  10/22/2011    Procedure: EXCISION MASS;  Surgeon: [name], MD;  Location: [place];  Service: General;  Laterality: Right;  excision right buttock mass  . Bladder suspension    . Cesarean section  1987; 1988  . Bunionectomy Left 2000  . Total knee arthroplasty Left 05/07/2015    Procedure: TOTAL LEFT KNEE ARTHROPLASTY;  Surgeon: [name], MD;  [facility];  Service: Orthopedics;  Laterality: Left;    There were no vitals  filed for this visit.      Subjective Assessment - 06/18/15 1124    Subjective Reports she was able to don her tennis shoes today.   Limitations Sitting   Currently in Pain? No/denies            [facility] PT Assessment - 06/18/15 0001    Assessment   Medical Diagnosis Left total knee replacement.   Onset Date/Surgical Date 05/07/15   Next MD Visit 06/2015   Precautions   Precaution Comments No ultrasound.  H/o seizures patient states she will always have a family member with her.  If it happens she states "just hold my head up."   Restrictions   Weight Bearing Restrictions No   ROM / Strength   AROM / PROM / Strength AROM   AROM   Overall AROM  Within functional limits for tasks performed;Deficits   AROM Assessment Site Knee   Right/Left Knee Left   Left Knee Extension 0   Left Knee Flexion 113                     [facility] Adult PT Treatment/Exercise - 06/18/15 0001    Knee/Hip Exercises: Aerobic   Nustep L5 x10 min, seat 6  Knee/Hip Exercises: Standing   Lateral Step Up Left;3 sets;10 reps;Hand Hold: 2;Step Height: 6"   Forward Step Up Left;3 sets;10 reps;Hand Hold: 2;Step Height: 6"   Rocker Board 3 minutes   Knee/Hip Exercises: Seated   Long Arc Quad Strengthening;Left;3 sets;10 reps;Weights   Long Arc Quad Weight 4 lbs.   Modalities   Modalities Designer, multimedia Location L knee   Electrical Stimulation Action IFC   Electrical Stimulation Parameters 1-10 Hz x15 min   Electrical Stimulation Goals Pain   Vasopneumatic   Number Minutes Vasopneumatic  15 minutes   Vasopnuematic Location  Knee   Vasopneumatic Pressure Medium   Vasopneumatic Temperature  50   Manual Therapy   Manual Therapy Soft tissue mobilization   Soft tissue mobilization L knee incision mobilizations in circles throughout; L patella mobilizations in L/R, sup/inf to promote proper mobility                   PT Short Term Goals - 06/18/15 1147    PT SHORT TERM GOAL #1   Title Ind with a initial HEP.   Time 4   Period Weeks   Status Achieved   PT SHORT TERM GOAL #2   Title Achieve full left knee extension to help normalize her gait pattern.   Time 4   Period Weeks   Status Achieved  AROM L knee ext 0 deg as of 06/18/2015           PT Long Term Goals - 06/18/15 1127    PT LONG TERM GOAL #1   Title Ind with an advanced HEP.   Time 8   Period Weeks   Status On-going   PT LONG TERM GOAL #2   Title Decrease edema to within 2 cms of contralateral side to assist with range of motion gains and decrease pain.   Time 8   Period Weeks   Status On-going   PT LONG TERM GOAL #3   Title Active left knee flexion to 115 degrees+ so the patient can perform functional tasks and do so with pain not > 2-3/10.   Time 8   Period Weeks   Status On-going  AROM L knee flexion 113 deg 06/18/2015   PT LONG TERM GOAL #4   Title Perform a reciprocating stair gait with one railing with pain not > 2-3/10.   Time 8   Period Weeks   Status On-going   PT LONG TERM GOAL #5   Title Perform ADL's with pain not > 3/10.   Time 8   Period Weeks   Status Achieved               Plan - 06/18/15 1125    Clinical Impression Statement Patient has progressed well with treatment following L TKR and has been able to achieve LT goals regarding pain with ADLs, L knee ext at this time. Patient able to tolerate therapeutic exercises well and reports that "today was the best day" she has had "in a while." AROM of L knee measured as 0-113 deg in supine today. Patient demonstrated good mobility of both L knee incision and patella today in clinic. Normal modalities response noted following removal of the modalities.   Rehab Potential Excellent   PT Frequency 3x / week   PT Duration 4 weeks   PT Treatment/Interventions ADLs/Self Care Home Management;Cryotherapy;Electrical Stimulation;Moist Heat;Therapeutic  exercise;Therapeutic activities;Manual techniques;Patient/family education;Passive range of motion;Vasopneumatic Device   PT Next  Visit Plan Continue per L TKR protocol with modalties PRN for pain/edema per MTP POC.    Consulted and Agree with Plan of Care Patient      Patient will benefit from skilled therapeutic intervention in order to improve the following deficits and impairments:  Pain, Decreased activity tolerance, Decreased range of motion, Increased edema  Visit Diagnosis: Stiffness of left knee, not elsewhere classified  Pain in left knee     Problem List Patient Active Problem List   Diagnosis Date Noted  . OA (osteoarthritis) of knee 05/07/2015  . Status epilepticus ([place]) 01/24/2015  . Altered mental status 01/23/2015  . Acute encephalopathy 01/23/2015  . Seizures ([place]) 01/23/2015  . Epilepsy without status epilepticus, not intractable ([place]) 02/20/2014  . Memory loss 02/20/2014  . 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 Moderate major depression ([place]) 10/14/2010    Wynelle Fanny, PTA 06/18/2015, 12:01 PM  [facility] [address] [place], [place], [address] Phone: [phone]   Fax:  [phone]  Name: [name] MRN: [id number] Date of Birth: 03-01-1965

## 2015-06-20 ENCOUNTER — Ambulatory Visit: Payer: PPO | Admitting: Physical Therapy

## 2015-06-20 ENCOUNTER — Telehealth: Payer: Self-pay | Admitting: Neurology

## 2015-06-20 DIAGNOSIS — M25662 Stiffness of left knee, not elsewhere classified: Secondary | ICD-10-CM | POA: Diagnosis not present

## 2015-06-20 DIAGNOSIS — M25562 Pain in left knee: Secondary | ICD-10-CM

## 2015-06-20 NOTE — Therapy (Signed)
[facility] [place], [place], [address] Phone: [phone]   Fax:  [phone]  Physical Therapy Treatment  Patient Details  Name: [name] MRN: [id number] Date of Birth: 09-Jun-1965 Referring Provider: [name] MD  Encounter Date: 06/20/2015      PT End of Session - 06/20/15 1457    Visit Number 9   Number of Visits 16   Date for PT Re-Evaluation 08/15/15   PT Start Time 1030   PT Stop Time 1127   PT Time Calculation (min) 57 min   Activity Tolerance Patient tolerated treatment well   Behavior During Therapy [facility] for tasks assessed/performed      Past Medical History  Diagnosis Date  . Vertigo   . Constipation   . Restless leg   . High cholesterol   . Chronic bronchitis ([place])     "yearly; when the weather changes" (04/22/2012)  . [id number])     "1/wk" (04/22/2012)  . Depression   . Epilepsy ([place])     "been having them right often here lately" (04/22/2012)  . Seizures ([place])   . Arthritis     "knees" (04/22/2012)  . Osteoarthritis     [name] 04/22/2012  . Colon polyps     adenomatous and hyperplastic-  . Rectal bleed     in toilet- bright red  . Other convulsions 05/21/12    non-epileptic spells  . Bronchitis   . Fatty liver   . Chronic kidney disease   . Eczema     Past Surgical History  Procedure Laterality Date  . Total hip arthroplasty Left 1993; 1995; 2000  . Abdominal hysterectomy  2001  . Colonoscopy    . Joint replacement    . Mass excision  10/22/2011    Procedure: EXCISION MASS;  Surgeon: [name], MD;  Location: [place];  Service: General;  Laterality: Right;  excision right buttock mass  . Bladder suspension    . Cesarean section  1987; 1988  . Bunionectomy Left 2000  . Total knee arthroplasty Left 05/07/2015    Procedure: TOTAL LEFT KNEE ARTHROPLASTY;  Surgeon: [name], MD;  [facility];  Service: Orthopedics;  Laterality: Left;    There were no vitals  filed for this visit.      Subjective Assessment - 06/20/15 1458    Subjective I think am doing well.   Limitations Sitting   Pain Location Knee   Pain Orientation Left   Pain Descriptors / Indicators Sore   Pain Type Surgical pain   Pain Onset 1 to 4 weeks ago   Pain Frequency Intermittent            [facility] PT Assessment - 06/20/15 0001    AROM   Left Knee Flexion --  Active= 110 degrees and passive= 115 degrees.                     [place] Adult PT Treatment/Exercise - 06/20/15 0001    Exercises   Exercises Knee/Hip   Knee/Hip Exercises: Aerobic   Nustep Level 5 x 20 minutes moving forward x 4 to increase flexion.   Acupuncturist Location Left knee   Electrical Stimulation Action IFC x 15 minutes.   Vasopneumatic   Number Minutes Vasopneumatic  15 minutes   Vasopnuematic Location  --  Left knee.   Vasopneumatic Pressure Medium   Manual Therapy   Passive ROM PROM into left knee flexion and  extension= 10 minutes.                  PT Short Term Goals - 06/18/15 1147    PT SHORT TERM GOAL #1   Title Ind with a initial HEP.   Time 4   Period Weeks   Status Achieved   PT SHORT TERM GOAL #2   Title Achieve full left knee extension to help normalize her gait pattern.   Time 4   Period Weeks   Status Achieved  AROM L knee ext 0 deg as of 06/18/2015           PT Long Term Goals - 06/18/15 1127    PT LONG TERM GOAL #1   Title Ind with an advanced HEP.   Time 8   Period Weeks   Status On-going   PT LONG TERM GOAL #2   Title Decrease edema to within 2 cms of contralateral side to assist with range of motion gains and decrease pain.   Time 8   Period Weeks   Status On-going   PT LONG TERM GOAL #3   Title Active left knee flexion to 115 degrees+ so the patient can perform functional tasks and do so with pain not > 2-3/10.   Time 8   Period Weeks   Status On-going  AROM L knee flexion 113 deg 06/18/2015    PT LONG TERM GOAL #4   Title Perform a reciprocating stair gait with one railing with pain not > 2-3/10.   Time 8   Period Weeks   Status On-going   PT LONG TERM GOAL #5   Title Perform ADL's with pain not > 3/10.   Time 8   Period Weeks   Status Achieved             Patient will benefit from skilled therapeutic intervention in order to improve the following deficits and impairments:     Visit Diagnosis: Stiffness of left knee, not elsewhere classified  Pain in left knee     Problem List Patient Active Problem List   Diagnosis Date Noted  . OA (osteoarthritis) of knee 05/07/2015  . Status epilepticus ([place]) 01/24/2015  . Altered mental status 01/23/2015  . Acute encephalopathy 01/23/2015  . Seizures ([place]) 01/23/2015  . Epilepsy without status epilepticus, not intractable ([place]) 02/20/2014  . Memory loss 02/20/2014  . 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 Moderate major depression ([place]) 10/14/2010    APPLEGATE, [place] MPT 06/20/2015, 3:04 PM  [facility] [address] [place], [place], [address] Phone: [phone]   Fax:  [phone]  Name: SWAPNA GRECH MRN: [id number] Date of Birth: Sep 17, 1965

## 2015-06-20 NOTE — Telephone Encounter (Signed)
Records mailed to [facility] dg

## 2015-06-21 NOTE — Telephone Encounter (Signed)
Records mailed to [facility] dg

## 2015-06-25 ENCOUNTER — Encounter: Payer: Self-pay | Admitting: Physical Therapy

## 2015-06-25 ENCOUNTER — Ambulatory Visit: Payer: PPO | Admitting: Physical Therapy

## 2015-06-25 DIAGNOSIS — M25562 Pain in left knee: Secondary | ICD-10-CM

## 2015-06-25 DIAGNOSIS — M25662 Stiffness of left knee, not elsewhere classified: Secondary | ICD-10-CM | POA: Diagnosis not present

## 2015-06-25 NOTE — Therapy (Signed)
[facility] [place], [place], [address] Phone: [phone]   Fax:  [phone]  Physical Therapy Treatment  Patient Details  Name: [name] MRN: [id number] Date of Birth: 1965-06-01 Referring Provider: [name] MD  Encounter Date: 06/25/2015      PT End of Session - 06/25/15 1119    Visit Number 10   Number of Visits 16   Date for PT Re-Evaluation 08/15/15   PT Start Time 1118   PT Stop Time 1211   PT Time Calculation (min) 53 min   Activity Tolerance Patient tolerated treatment well   Behavior During Therapy [facility] for tasks assessed/performed      Past Medical History  Diagnosis Date  . Vertigo   . Constipation   . Restless leg   . High cholesterol   . Chronic bronchitis ([place])     "yearly; when the weather changes" (04/22/2012)  . [id number])     "1/wk" (04/22/2012)  . Depression   . Epilepsy ([place])     "been having them right often here lately" (04/22/2012)  . Seizures ([place])   . Arthritis     "knees" (04/22/2012)  . Osteoarthritis     [name] 04/22/2012  . Colon polyps     adenomatous and hyperplastic-  . Rectal bleed     in toilet- bright red  . Other convulsions 05/21/12    non-epileptic spells  . Bronchitis   . Fatty liver   . Chronic kidney disease   . Eczema     Past Surgical History  Procedure Laterality Date  . Total hip arthroplasty Left 1993; 1995; 2000  . Abdominal hysterectomy  2001  . Colonoscopy    . Joint replacement    . Mass excision  10/22/2011    Procedure: EXCISION MASS;  Surgeon: [name], MD;  Location: [place];  Service: General;  Laterality: Right;  excision right buttock mass  . Bladder suspension    . Cesarean section  1987; 1988  . Bunionectomy Left 2000  . Total knee arthroplasty Left 05/07/2015    Procedure: TOTAL LEFT KNEE ARTHROPLASTY;  Surgeon: [name], MD;  [facility];  Service: Orthopedics;  Laterality: Left;    There were no vitals  filed for this visit.      Subjective Assessment - 06/25/15 1119    Limitations Sitting            [facility] PT Assessment - 06/25/15 0001    Assessment   Medical Diagnosis Left total knee replacement.   Onset Date/Surgical Date 05/07/15   Next MD Visit 06/2015   Precautions   Precaution Comments No ultrasound.  H/o seizures patient states she will always have a family member with her.  If it happens she states "just hold my head up."   Restrictions   Weight Bearing Restrictions No   Observation/Other Assessments-Edema    Edema Circumferential   Circumferential Edema   Circumferential - Right 46.4   Circumferential - Left  46.8   ROM / Strength   AROM / PROM / Strength AROM   AROM   Overall AROM  Within functional limits for tasks performed   AROM Assessment Site Knee   Right/Left Knee Left   Left Knee Flexion 116                     [facility] Adult PT Treatment/Exercise - 06/25/15 0001    Ambulation/Gait   Stairs Yes   Stairs Assistance 7:  Independent   Stair Management Technique One rail Right;Alternating pattern;Forwards   Number of Stairs 12   Height of Stairs 6   Knee/Hip Exercises: Aerobic   Nustep L6,s eat 7 x15 min   Knee/Hip Exercises: Standing   Terminal Knee Extension Strengthening;Left;2 sets;10 reps;Other (comment)  Pink XTS   Lateral Step Up Left;3 sets;10 reps;Hand Hold: 2;Step Height: 6"   Forward Step Up Left;3 sets;10 reps;Hand Hold: 2;Step Height: 6"   Knee/Hip Exercises: Seated   Long Arc Quad Strengthening;Left;3 sets;10 reps;Weights   Long Arc Quad Weight 5 lbs.   Modalities   Modalities Designer, multimedia Location Left knee   Electrical Stimulation Action IFC   Electrical Stimulation Parameters 1-10 hz x70min   Electrical Stimulation Goals Pain   Vasopneumatic   Number Minutes Vasopneumatic  10 minutes   Vasopnuematic Location  Knee   Vasopneumatic Pressure  Medium   Vasopneumatic Temperature  34                  PT Short Term Goals - 06/18/15 1147    PT SHORT TERM GOAL #1   Title Ind with a initial HEP.   Time 4   Period Weeks   Status Achieved   PT SHORT TERM GOAL #2   Title Achieve full left knee extension to help normalize her gait pattern.   Time 4   Period Weeks   Status Achieved  AROM L knee ext 0 deg as of 06/18/2015           PT Long Term Goals - 06/25/15 1142    PT LONG TERM GOAL #1   Title Ind with an advanced HEP.   Time 8   Period Weeks   Status On-going   PT LONG TERM GOAL #2   Title Decrease edema to within 2 cms of contralateral side to assist with range of motion gains and decrease pain.   Time 8   Period Weeks   Status Achieved  0.4 cm difference L>R 06/25/2015   PT LONG TERM GOAL #3   Title Active left knee flexion to 115 degrees+ so the patient can perform functional tasks and do so with pain not > 2-3/10.   Time 8   Period Weeks   Status Achieved  AROM L knee flexion 116 deg 06/25/2015   PT LONG TERM GOAL #4   Title Perform a reciprocating stair gait with one railing with pain not > 2-3/10.   Time 8   Period Weeks   Status Achieved   PT LONG TERM GOAL #5   Title Perform ADL's with pain not > 3/10.   Time 8   Period Weeks   Status Achieved               Plan - 06/25/15 1153    Clinical Impression Statement Patient has progressed very well since beginning PT following L TKR. Patient has now achieved all goals set at evaluation including L knee edema and stair ambulation now. AROM L knee measured as 116 deg in supine today.  Patient reported no difficulty with stairs prior to stair assessment and no pain were reported per patient. 0.4 cm difference in B knee edema wihth L>R today. Patient remains deficient with TKE technique with moderate to maximal mutlimodal cueing although patient states that she may be deficient due to previous hip surgery.    Rehab Potential Excellent   PT  Frequency 3x / week   PT  Duration 4 weeks   PT Treatment/Interventions ADLs/Self Care Home Management;Cryotherapy;Electrical Stimulation;Moist Heat;Therapeutic exercise;Therapeutic activities;Manual techniques;Patient/family education;Passive range of motion;Vasopneumatic Device   PT Next Visit Plan Continue per L TKR protocol with modalties PRN for pain/edema per MTP POC.    Consulted and Agree with Plan of Care Patient      Patient will benefit from skilled therapeutic intervention in order to improve the following deficits and impairments:  Pain, Decreased activity tolerance, Decreased range of motion, Increased edema  Visit Diagnosis: Stiffness of left knee, not elsewhere classified  Pain in left knee     Problem List Patient Active Problem List   Diagnosis Date Noted  . OA (osteoarthritis) of knee 05/07/2015  . Status epilepticus ([place]) 01/24/2015  . Altered mental status 01/23/2015  . Acute encephalopathy 01/23/2015  . Seizures ([place]) 01/23/2015  . Epilepsy without status epilepticus, not intractable ([place]) 02/20/2014  . Memory loss 02/20/2014  . 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 Moderate major depression ([place]) 10/14/2010    Wynelle Fanny, PTA 06/25/2015, 12:53 PM  [facility] [address] [place], [place], [address] Phone: [phone]   Fax:  [phone]  Name: TORRIA GREALISH MRN: [id number] Date of Birth: 12/02/1965

## 2015-06-27 ENCOUNTER — Ambulatory Visit: Payer: PPO | Admitting: Physical Therapy

## 2015-06-27 ENCOUNTER — Encounter: Payer: Self-pay | Admitting: Physical Therapy

## 2015-06-27 DIAGNOSIS — M25562 Pain in left knee: Secondary | ICD-10-CM

## 2015-06-27 DIAGNOSIS — M25662 Stiffness of left knee, not elsewhere classified: Secondary | ICD-10-CM | POA: Diagnosis not present

## 2015-06-27 NOTE — Therapy (Signed)
[facility] [place], [place], [address] Phone: [phone]   Fax:  [phone]  Physical Therapy Treatment  Patient Details  Name: [name] MRN: [id number] Date of Birth: 16-Aug-1965 Referring Provider: [name] MD  Encounter Date: 06/27/2015      PT End of Session - 06/27/15 1110    Visit Number 11   Number of Visits 16   Date for PT Re-Evaluation 08/15/15   PT Start Time 1117   PT Stop Time 1203   PT Time Calculation (min) 46 min   Activity Tolerance Patient tolerated treatment well   Behavior During Therapy [facility] for tasks assessed/performed      Past Medical History  Diagnosis Date  . Vertigo   . Constipation   . Restless leg   . High cholesterol   . Chronic bronchitis ([place])     "yearly; when the weather changes" (04/22/2012)  . [id number](833.3)     "1/wk" (04/22/2012)  . Depression   . Epilepsy ([place])     "been having them right often here lately" (04/22/2012)  . Seizures ([place])   . Arthritis     "knees" (04/22/2012)  . Osteoarthritis     [name] 04/22/2012  . Colon polyps     adenomatous and hyperplastic-  . Rectal bleed     in toilet- bright red  . Other convulsions 05/21/12    non-epileptic spells  . Bronchitis   . Fatty liver   . Chronic kidney disease   . Eczema     Past Surgical History  Procedure Laterality Date  . Total hip arthroplasty Left 1993; 1995; 2000  . Abdominal hysterectomy  2001  . Colonoscopy    . Joint replacement    . Mass excision  10/22/2011    Procedure: EXCISION MASS;  Surgeon: [name], MD;  Location: [facility];  Service: General;  Laterality: Right;  excision right buttock mass  . Bladder suspension    . Cesarean section  1987; 1988  . Bunionectomy Left 2000  . Total knee arthroplasty Left 05/07/2015    Procedure: TOTAL LEFT KNEE ARTHROPLASTY;  Surgeon: [name], MD;  [facility];  Service: Orthopedics;  Laterality: Left;    There were no vitals  filed for this visit.      Subjective Assessment - 06/27/15 1110    Limitations Sitting            [facility] PT Assessment - 06/27/15 0001    Assessment   Medical Diagnosis Left total knee replacement.   Onset Date/Surgical Date 05/07/15   Next MD Visit 06/2015   Precautions   Precaution Comments No ultrasound.  H/o seizures patient states she will always have a family member with her.  If it happens she states "just hold my head up."   Restrictions   Weight Bearing Restrictions No                     [facility] Adult PT Treatment/Exercise - 06/27/15 0001    Knee/Hip Exercises: Aerobic   Nustep L6,s eat 4 x15 min   Knee/Hip Exercises: Standing   Lateral Step Up Left;3 sets;10 reps;Hand Hold: 2;Step Height: 6"   Forward Step Up Left;3 sets;10 reps;Hand Hold: 2;Step Height: 8"   Rocker Board 3 minutes   Modalities   Modalities Electrical Stimulation;Vasopneumatic   Electrical Stimulation   Electrical Stimulation Location Left knee   Electrical Stimulation Action IFC   Electrical Stimulation Parameters 1-10 hz x15 min  Electrical Stimulation Goals Edema   Vasopneumatic   Number Minutes Vasopneumatic  15 minutes   Vasopnuematic Location  Knee   Vasopneumatic Pressure Medium   Vasopneumatic Temperature  34                  PT Short Term Goals - 06/18/15 1147    PT SHORT TERM GOAL #1   Title Ind with a initial HEP.   Time 4   Period Weeks   Status Achieved   PT SHORT TERM GOAL #2   Title Achieve full left knee extension to help normalize her gait pattern.   Time 4   Period Weeks   Status Achieved  AROM L knee ext 0 deg as of 06/18/2015           PT Long Term Goals - 06/27/15 1132    PT LONG TERM GOAL #1   Title Ind with an advanced HEP.   Time 8   Period Weeks   Status Achieved   PT LONG TERM GOAL #2   Title Decrease edema to within 2 cms of contralateral side to assist with range of motion gains and decrease pain.   Time 8   Period Weeks    Status Achieved  0.4 cm difference L>R 06/25/2015   PT LONG TERM GOAL #3   Title Active left knee flexion to 115 degrees+ so the patient can perform functional tasks and do so with pain not > 2-3/10.   Time 8   Period Weeks   Status Achieved  AROM L knee flexion 116 deg 06/25/2015   PT LONG TERM GOAL #4   Title Perform a reciprocating stair gait with one railing with pain not > 2-3/10.   Time 8   Period Weeks   Status Achieved   PT LONG TERM GOAL #5   Title Perform ADL's with pain not > 3/10.   Time 8   Period Weeks   Status Achieved               Plan - 06/27/15 1149    Clinical Impression Statement Patient has progressed very well since beginning PT to rehabilitate L TKR. Patient has met all goals set at evaluation at this time. Patient reports that she still has some difficulty with getting into and out of her car which she rides in a car that is low to the ground per patient. Patient has no difficulty with step ups conducted in clinic. Normal modalities response noted following removal of the modalities.   Rehab Potential Excellent   PT Frequency 3x / week   PT Duration 4 weeks   PT Treatment/Interventions ADLs/Self Care Home Management;Cryotherapy;Electrical Stimulation;Moist Heat;Therapeutic exercise;Therapeutic activities;Manual techniques;Patient/family education;Passive range of motion;Vasopneumatic Device   PT Next Visit Plan D/C summary required by MPT due to meeting all goals.   Consulted and Agree with Plan of Care Patient      Patient will benefit from skilled therapeutic intervention in order to improve the following deficits and impairments:  Pain, Decreased activity tolerance, Decreased range of motion, Increased edema  Visit Diagnosis: Stiffness of left knee, not elsewhere classified  Pain in left knee     Problem List Patient Active Problem List   Diagnosis Date Noted  . OA (osteoarthritis) of knee 05/07/2015  . Status epilepticus ([place])  01/24/2015  . Altered mental status 01/23/2015  . Acute encephalopathy 01/23/2015  . Seizures ([place]) 01/23/2015  . Epilepsy without status epilepticus, not intractable ([place]) 02/20/2014  . Memory loss  02/20/2014  . 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 Moderate major depression ([place]) 10/14/2010    Ahmed Prima, PTA 06/27/2015 12:06 PM  [facility] [place], [place], [address] Phone: [phone]   Fax:  [phone]  Name: ANASTACIA REINECKE MRN: [id number] Date of Birth: 1965/09/12

## 2015-06-27 NOTE — Therapy (Signed)
[facility] [place], [place], [address] Phone: [phone]   Fax:  [phone]  Physical Therapy Treatment  Patient Details  Name: [name] MRN: [id number] Date of Birth: 16-Aug-1965 Referring Provider: [name] MD  Encounter Date: 06/27/2015      PT End of Session - 06/27/15 1110    Visit Number 11   Number of Visits 16   Date for PT Re-Evaluation 08/15/15   PT Start Time 1117   PT Stop Time 1203   PT Time Calculation (min) 46 min   Activity Tolerance Patient tolerated treatment well   Behavior During Therapy [facility] for tasks assessed/performed      Past Medical History  Diagnosis Date  . Vertigo   . Constipation   . Restless leg   . High cholesterol   . Chronic bronchitis ([place])     "yearly; when the weather changes" (04/22/2012)  . NHAFBXUX(833.3)     "1/wk" (04/22/2012)  . Depression   . Epilepsy ([place])     "been having them right often here lately" (04/22/2012)  . Seizures ([place])   . Arthritis     "knees" (04/22/2012)  . Osteoarthritis     [name] 04/22/2012  . Colon polyps     adenomatous and hyperplastic-  . Rectal bleed     in toilet- bright red  . Other convulsions 05/21/12    non-epileptic spells  . Bronchitis   . Fatty liver   . Chronic kidney disease   . Eczema     Past Surgical History  Procedure Laterality Date  . Total hip arthroplasty Left 1993; 1995; 2000  . Abdominal hysterectomy  2001  . Colonoscopy    . Joint replacement    . Mass excision  10/22/2011    Procedure: EXCISION MASS;  Surgeon: [name], MD;  Location: [facility];  Service: General;  Laterality: Right;  excision right buttock mass  . Bladder suspension    . Cesarean section  1987; 1988  . Bunionectomy Left 2000  . Total knee arthroplasty Left 05/07/2015    Procedure: TOTAL LEFT KNEE ARTHROPLASTY;  Surgeon: [name], MD;  [facility];  Service: Orthopedics;  Laterality: Left;    There were no vitals  filed for this visit.      Subjective Assessment - 06/27/15 1110    Limitations Sitting            [facility] PT Assessment - 06/27/15 0001    Assessment   Medical Diagnosis Left total knee replacement.   Onset Date/Surgical Date 05/07/15   Next MD Visit 06/2015   Precautions   Precaution Comments No ultrasound.  H/o seizures patient states she will always have a family member with her.  If it happens she states "just hold my head up."   Restrictions   Weight Bearing Restrictions No                     [facility] Adult PT Treatment/Exercise - 06/27/15 0001    Knee/Hip Exercises: Aerobic   Nustep L6,s eat 4 x15 min   Knee/Hip Exercises: Standing   Lateral Step Up Left;3 sets;10 reps;Hand Hold: 2;Step Height: 6"   Forward Step Up Left;3 sets;10 reps;Hand Hold: 2;Step Height: 8"   Rocker Board 3 minutes   Modalities   Modalities Electrical Stimulation;Vasopneumatic   Electrical Stimulation   Electrical Stimulation Location Left knee   Electrical Stimulation Action IFC   Electrical Stimulation Parameters 1-10 hz x15 min  Electrical Stimulation Goals Edema   Vasopneumatic   Number Minutes Vasopneumatic  15 minutes   Vasopnuematic Location  Knee   Vasopneumatic Pressure Medium   Vasopneumatic Temperature  34                  PT Short Term Goals - 06/18/15 1147    PT SHORT TERM GOAL #1   Title Ind with a initial HEP.   Time 4   Period Weeks   Status Achieved   PT SHORT TERM GOAL #2   Title Achieve full left knee extension to help normalize her gait pattern.   Time 4   Period Weeks   Status Achieved  AROM L knee ext 0 deg as of 06/18/2015           PT Long Term Goals - 06/27/15 1132    PT LONG TERM GOAL #1   Title Ind with an advanced HEP.   Time 8   Period Weeks   Status Achieved   PT LONG TERM GOAL #2   Title Decrease edema to within 2 cms of contralateral side to assist with range of motion gains and decrease pain.   Time 8   Period Weeks    Status Achieved  0.4 cm difference L>R 06/25/2015   PT LONG TERM GOAL #3   Title Active left knee flexion to 115 degrees+ so the patient can perform functional tasks and do so with pain not > 2-3/10.   Time 8   Period Weeks   Status Achieved  AROM L knee flexion 116 deg 06/25/2015   PT LONG TERM GOAL #4   Title Perform a reciprocating stair gait with one railing with pain not > 2-3/10.   Time 8   Period Weeks   Status Achieved   PT LONG TERM GOAL #5   Title Perform ADL's with pain not > 3/10.   Time 8   Period Weeks   Status Achieved               Plan - 06/27/15 1149    Clinical Impression Statement Patient has progressed very well since beginning PT to rehabilitate L TKR. Patient has met all goals set at evaluation at this time. Patient reports that she still has some difficulty with getting into and out of her car which she rides in a car that is low to the ground per patient. Patient has no difficulty with step ups conducted in clinic. Normal modalities response noted following removal of the modalities.   Rehab Potential Excellent   PT Frequency 3x / week   PT Duration 4 weeks   PT Treatment/Interventions ADLs/Self Care Home Management;Cryotherapy;Electrical Stimulation;Moist Heat;Therapeutic exercise;Therapeutic activities;Manual techniques;Patient/family education;Passive range of motion;Vasopneumatic Device   PT Next Visit Plan D/C summary required by MPT due to meeting all goals.   Consulted and Agree with Plan of Care Patient      Patient will benefit from skilled therapeutic intervention in order to improve the following deficits and impairments:  Pain, Decreased activity tolerance, Decreased range of motion, Increased edema  Visit Diagnosis: Stiffness of left knee, not elsewhere classified  Pain in left knee     Problem List Patient Active Problem List   Diagnosis Date Noted  . OA (osteoarthritis) of knee 05/07/2015  . Status epilepticus ([place])  01/24/2015  . Altered mental status 01/23/2015  . Acute encephalopathy 01/23/2015  . Seizures ([place]) 01/23/2015  . Epilepsy without status epilepticus, not intractable ([place]) 02/20/2014  . Memory loss  02/20/2014  . 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 Moderate major depression ([place]) 10/14/2010   PHYSICAL THERAPY DISCHARGE SUMMARY  Visits from Start of Care: 11  Current functional level related to goals / functional outcomes: Please see above.   Remaining deficits: All goals met.   Education / Equipment: HEP.  Plan: Patient agrees to discharge.  Patient goals were met. Patient is being discharged due to meeting the stated rehab goals.  ?????      [place], [place] MPT 06/27/2015, 4:20 PM  [facility] [address] [place], [place], [address] Phone: [phone]   Fax:  [phone]  Name: [name] MRN: [id number] Date of Birth: 14-Jan-1965

## 2015-07-03 ENCOUNTER — Telehealth: Payer: Self-pay | Admitting: Family Medicine

## 2015-07-04 NOTE — Telephone Encounter (Signed)
Pt does not want

## 2015-07-12 ENCOUNTER — Telehealth: Payer: Self-pay | Admitting: Family Medicine

## 2015-07-12 NOTE — Telephone Encounter (Signed)
Pt aware and is bringing her husband in for a 2:40 appt today and will schedule at that time.

## 2015-07-12 NOTE — Telephone Encounter (Signed)
She needs to be seen for a script, last script was for 6 months written 6 months ago.

## 2015-07-23 ENCOUNTER — Other Ambulatory Visit: Payer: Self-pay | Admitting: Neurology

## 2015-07-24 ENCOUNTER — Other Ambulatory Visit: Payer: Self-pay | Admitting: Neurology

## 2015-07-24 ENCOUNTER — Ambulatory Visit (INDEPENDENT_AMBULATORY_CARE_PROVIDER_SITE_OTHER): Payer: PPO | Admitting: Neurology

## 2015-07-24 ENCOUNTER — Encounter: Payer: Self-pay | Admitting: Neurology

## 2015-07-24 VITALS — BP 120/80 | HR 82 | Resp 20 | Ht 60.0 in | Wt 164.0 lb

## 2015-07-24 DIAGNOSIS — G4730 Sleep apnea, unspecified: Secondary | ICD-10-CM

## 2015-07-24 DIAGNOSIS — G4700 Insomnia, unspecified: Secondary | ICD-10-CM

## 2015-07-24 DIAGNOSIS — Z8669 Personal history of other diseases of the nervous system and sense organs: Secondary | ICD-10-CM | POA: Diagnosis not present

## 2015-07-24 DIAGNOSIS — G40201 Localization-related (focal) (partial) symptomatic epilepsy and epileptic syndromes with complex partial seizures, not intractable, with status epilepticus: Secondary | ICD-10-CM

## 2015-07-24 DIAGNOSIS — Z87898 Personal history of other specified conditions: Secondary | ICD-10-CM | POA: Insufficient documentation

## 2015-07-24 DIAGNOSIS — R0683 Snoring: Secondary | ICD-10-CM | POA: Diagnosis not present

## 2015-07-24 MED ORDER — LEVETIRACETAM 500 MG PO TABS: ORAL_TABLET | ORAL | Status: DC

## 2015-07-24 NOTE — Progress Notes (Signed)
PATIENT: [name] DOB: [birth year]  REASON FOR VISIT: follow up- seizures HISTORY FROM: patient  HISTORY OF PRESENT ILLNESS: [name] is a 50 year old female with a history of seizures, for years Dr. Bernardo Heater patient.  She reports that she had a seizure last week, Saturday.  She states that she's been taking Keppra and Lamictal consistently excpet for Friday night- the missed dose may have provoked the seizure. . Denies missing any medication. She states that after hitting her head she suffered a headache for 2 days however that has resolved. The patient does not operate a motor vehicle. At home she is able to complete all ADLs independent. She is no longer on narcotic medication. She reports that she will be having a second  knee replacement in 4 month. Mr. Whitfill has done well with her last full knee replacement in April of this year, she had a hip replacement already 17 years ago, both by Dr.alusio. [name] reports that she has insomnia problems. There is also a report that her husband is very hard of hearing to the TV is very loud during the day and communication at times difficult. She has regular bedtimes between 9 and 10 PM, she will fall asleep at she will wake up around 2 AM and has great difficulties to resume sleep. She could use melatonin 3 mg at night as a natural supplement to help her stay asleep longer, if this should be to no avail I would consider a low-dose of Klonopin. In a seizure patient I would not like to use Ambien as most benzodiazepines have at least an additional seizure controlling affect. She also has a history of parasomnia / sleepwalking.  She usually sleeps on her side, she sleeps on one pillow. The bedroom is cool, quiet and dark. She has a TV in the bedroom but cuts it off when she goes to bed. She is not sure what wakes her at 2 in the morning but it seems to be in early morning ritual and is very difficult for her to reinitiate sleep. Since her husband  has noted her to snore but we have no reliable source reporting apnea, I will order an attended sleep study to test her.    HISTORY 02/01/2015 (MM): [name] is a 41 female with a history of seizures. She returns today for an evaluation. The patient had several seizures and was eventually taken to the emergency room. The patient did improve once at the emergency room. She did not have to be intubated. Her Keppra was increased to 1000 mg twice a day. The patient continues on Lamictal. Since then the patient has not had any additional seizures. The patient's sister-in-law brought to my attention in private that she feels that the patient is taking pain medicine inappropriately. She is wondering if this was the cause of some of her symptoms/seizures when she went to the emergency room. She states that her physicians continue to give her pain medicine for her knee pain even after they have called and requested that she not be given this. Patient reports that she is able to complete ADLs independently. Patient does report ongoing depression. In the past she has been asked to follow-up with mental health however the patient has failed to do this. She is currently on Celexa.She denies any new neurological symptoms. She returns today for an evaluation.  HISTORY [facility]/14 [facility]  [name] is here today for follow-up on her subjective memory complaints. A [place] cognitive assessment test  was performed today in which she failed on the Trail Making Test and was unable to copy a cube. The contour of her clock was also father droopy but she placed the numbers and hands correctly. She was able to name objects she did very well in all parts of attention testing including serial sevens and repeated the complicated sentences her word fluency however was limited she only generated 8 words she was good as obstruction and fully oriented to place time and date her delayed recall testing showed 4 out of 5 delayed  words to be correctly remembered this is a very good result she scored 26 out of 30 points. Patient is high school educated and this would be an normal score at 26-30.  We discussed anecdotal evidence for her spells- her husband reported that a couple of times they have been to the grocery store and she needed to sit down for moment she seems to be mostly claustrophobic when surrounded by a lot of people. This may be more of an anxiety attack. Reports some insomnia, mostly related to being worried about her meanwhile 34 year old son. When she hasn't had a good night sleep she is more likely to be hypersensitive to claustrophobia or to other spells of anxiety or panic. No other "seizures " on the current medication.  REVIEW OF SYSTEMS: Out of a complete 14 system review of symptoms, the patient complains only of the following symptoms, and all other reviewed systems are negative.  Constipation, leg swelling, restless leg, joint pain, aching muscles, walking difficulty, headache, seizures, agitation, depression  ALLERGIES: Allergies  Allergen Reactions  . Acetaminophen Other (See Comments)    Has fatty deposits on liver  . Benadryl [Diphenhydramine Hcl] Other (See Comments)    hyperactivity and seizures  . Codeine Itching and Rash    seizures  . Dilantin [Phenytoin Sodium Extended] Other (See Comments)    Elevated LFT's  . Melatonin Other (See Comments)    seizures  . Ultram [Tramadol Hcl] Other (See Comments)    Seizures  . Vimpat [Lacosamide] Other (See Comments)    Severe dizziness  . Betadine [Povidone Iodine] Rash  . Latex Rash  . Penicillins Itching and Rash    Has patient had a PCN reaction causing immediate rash, facial/tongue/throat swelling, SOB or lightheadedness with hypotension: No Has patient had a PCN reaction causing severe rash involving mucus membranes or skin necrosis: No Has patient had a PCN reaction that required hospitalization No Has patient had a PCN reaction  occurring within the last 10 years: No If all of the above answers are "NO", then may proceed with Cephalosporin use.  . Sulfa Antibiotics Rash  . Tape Rash    Paper tape please  . Vicodin [Hydrocodone-Acetaminophen] Itching    HOME MEDICATIONS: Outpatient Prescriptions Prior to Visit  Medication Sig Dispense Refill  . citalopram (CELEXA) 40 MG tablet Take 1 tablet (40 mg total) by mouth daily. 30 tablet 3  . clonazePAM (KLONOPIN) 0.5 MG tablet TAke one each morning and as needed for seizure. Take one at bedtime. Repeat one hour later if needed for sleep. (Patient taking differently: TAke  as needed for seizure. Take one at bedtime. Repeat one hour later if needed for sleep.) 90 tablet 5  . cyclobenzaprine (FLEXERIL) 5 MG tablet Take 1 tablet (5 mg total) by mouth 3 (three) times daily as needed for muscle spasms. 90 tablet 0  . gabapentin (NEURONTIN) 600 MG tablet Take 600 mg by mouth 3 (three) times daily.    Marland Kitchen  HYDROmorphone (DILAUDID) 4 MG tablet Take 1-2 tablets (4-8 mg total) by mouth every 4 (four) hours as needed for moderate pain or severe pain. 90 tablet 0  . lamoTRIgine (LAMICTAL) 100 MG tablet TAKE ONE TABLET BY MOUTH TWICE DAILY 56 tablet 0  . levETIRAcetam (KEPPRA) 500 MG tablet Take 2 tablets PO in the morning and 3 tablets PO in the evening. 150 tablet 5  . pramipexole (MIRAPEX) 0.5 MG tablet TAKE ONE TABLET BY MOUTH TWICE DAILY 60 tablet 2  . rivaroxaban (XARELTO) 10 MG TABS tablet Take 1 tablet (10 mg total) by mouth daily with breakfast. Take Xarelto for two and a half more weeks, then discontinue Xarelto. Once the patient has completed the blood thinner regimen, then take a Baby 81 mg Aspirin daily for three more weeks. 19 tablet 0   No facility-administered medications prior to visit.    PAST MEDICAL HISTORY: Past Medical History  Diagnosis Date  . Vertigo   . Constipation   . Restless leg   . High cholesterol   . Chronic bronchitis ([place])     "yearly; when the  weather changes" (04/22/2012)  . [id number])     "1/wk" (04/22/2012)  . Depression   . Epilepsy ([place])     "been having them right often here lately" (04/22/2012)  . Seizures ([place])   . Arthritis     "knees" (04/22/2012)  . Osteoarthritis     [name] 04/22/2012  . Colon polyps     adenomatous and hyperplastic-  . Rectal bleed     in toilet- bright red  . Other convulsions 05/21/12    non-epileptic spells  . Bronchitis   . Fatty liver   . Chronic kidney disease   . Eczema     PAST SURGICAL HISTORY: Past Surgical History  Procedure Laterality Date  . Total hip arthroplasty Left 1993; 1995; 2000  . Abdominal hysterectomy  2001  . Colonoscopy    . Joint replacement    . Mass excision  10/22/2011    Procedure: EXCISION MASS;  Surgeon: [name], MD;  Location: [facility];  Service: General;  Laterality: Right;  excision right buttock mass  . Bladder suspension    . Cesarean section  1987; 1988  . Bunionectomy Left 2000  . Total knee arthroplasty Left 05/07/2015    Procedure: TOTAL LEFT KNEE ARTHROPLASTY;  Surgeon: [name], MD;  [facility];  Service: Orthopedics;  Laterality: Left;    FAMILY HISTORY: Family History  Problem Relation Age of Onset  . Cancer Mother   . Cancer Brother   . Diabetes Brother   . Cancer Maternal Aunt   . Diabetes Maternal Aunt     SOCIAL HISTORY: Social History   Social History  . Marital Status: Married    Spouse Name: [name]  . Number of Children: 2  . Years of Education: 12 +   Occupational History  . unemployed Other   Social History Main Topics  . Smoking status: Never Smoker   . Smokeless tobacco: Never Used  . Alcohol Use: No  . Drug Use: No  . Sexual Activity: Not Currently    Birth Control/ Protection: Post-menopausal   Other Topics Concern  . Not on file   Social History Narrative   Patient is married [name]) and lives at home with her husband.   Patient has two adult children.   Patient is disabled.    Patient has a high school education and some trade school.   Patient  is right-handed.   Patient drinks four glasses of soda and tea daily.      PHYSICAL EXAM  Filed Vitals:   07/24/15 0956  BP: 120/80  Pulse: 82  Resp: 20  Height: 5' (1.524 m)  Weight: 164 lb (74.39 kg)   Body mass index is 32.03 kg/(m^2).  Generalized: Well developed, in no acute distress - slurred speech , dysphasia   Neck circumference is 14.5 inches, the patient's Mallampati is grade 3- her uvula is midline but doesn't lift completely up past the tongue ground, she does have symmetric facial movements, she has a mild ptosis of the right eye.  Neurological examination  Mentation: Alert oriented to time, place, history taking. Follows all commands language fluent. Patient's speech is slightly slurred.  She states that this is because she does not have her dentures in family confirms that she has spoken like this for years.  Cranial nerve : The patient reports also olfactory and gustatory auras -a weird taste, this preceded her last seizure on Saturday.  Blurred vision,  reported this has been slowly developing. Pupils were equal round reactive to light. Extraocular movements were full, visual field were full . Facial sensation and strength were normal. Uvula and tongue midline. Head turning and shoulder shrug were symmetric. Her hearing is intact  Motor: The motor testing reveals 5 / 5 strength ,symmetric motor tone is noted throughout.  Sensory: Sensory testing is intact to soft touch .Coordination: Cerebellar testing reveals good finger-nose-finger  bilaterally.  Gait and station: Patient has a slight limp on the left due to the knee. Tandem gait is unsteady. Romberg is negative Reflexes: Deep tendon reflexes are symmetric and normal bilaterally.   DIAGNOSTIC DATA (LABS, IMAGING, TESTING) - I reviewed patient records, labs, notes, testing and imaging myself where available.  Lab Results  Component Value Date     WBC 16.4* 05/09/2015   HGB 11.1* 05/09/2015   HCT 33.3* 05/09/2015   MCV 91.0 05/09/2015   PLT 342 05/09/2015      Component Value Date/Time   NA 142 05/09/2015 0426   NA 142 05/01/2015 1458   K 4.0 05/09/2015 0426   CL 104 05/09/2015 0426   CO2 30 05/09/2015 0426   GLUCOSE 104* 05/09/2015 0426   GLUCOSE 93 05/01/2015 1458   BUN 14 05/09/2015 0426   BUN 11 05/01/2015 1458   CREATININE 0.65 05/09/2015 0426   CALCIUM 9.1 05/09/2015 0426   PROT 6.1 05/01/2015 1458   PROT 6.8 01/24/2015 0540   ALBUMIN 4.1 05/01/2015 1458   ALBUMIN 4.2 01/24/2015 0540   AST 25 05/01/2015 1458   ALT 35* 05/01/2015 1458   ALKPHOS 64 05/01/2015 1458   BILITOT 0.4 05/01/2015 1458   BILITOT 1.3* 01/24/2015 0540   GFRNONAA >60 05/09/2015 0426   GFRAA >60 05/09/2015 0426   Lab Results  Component Value Date   CHOL 217* 04/09/2015   HDL 50 04/09/2015   LDLCALC 136* 04/09/2015   TRIG 156* 04/09/2015   CHOLHDL 4.3 04/09/2015       ASSESSMENT AND PLAN 50 y.o. year old female, looking older than her numeric age, here after another seizure. 30 minutes visit.  She   has a past medical history of Vertigo; Constipation; Restless leg; High cholesterol; Chronic bronchitis ([place]); Headache(784.0); Depression; Epilepsy ([place]); Seizures ([place]); Arthritis; Osteoarthritis; Colon polyps; Rectal bleed; Other convulsions (05/21/12); Bronchitis; Fatty liver; Chronic kidney disease; and Eczema. here with: Complaint of snoring per family, loud. Her husband  Reports this, and he  is almost deaf.  She has become his caretaker. He has leukemia, is oxygen dependent from CHF and graft versus host reaction after bone marrow transplant. Their son is in prison, uses CPAP.  She had total knee replacement in April and plans the other knee to be replaced in October. Dr. Maureen Ralphs.   1. Seizures, Epilepsy   The patient had an additional seizure after hitting her head. She has been having speech difficulties for many years, decades.  These were not associated with her seizure disorder.  Dr Erling Cruz had followed her for years, assuming she would have 2 or more seizure foci and possibly additional non-epileptic spells. She had been referred for EMU.   I will continue Keppra 500 mg to 2 tablets in the morning and 3 tablets in the evening.  She will continue on Lamictal 100 mg twice a day. Her last seizure was 07-21-2015 but confessed she had forgotten a night time dose the day before.    I will check blood work today. Patient advised that if she has any additional seizure she should let us know.  2. Insomnia, concerns about her husband dominate her thinking, she is worried. She snore, but she doesn't believe she has apnea.    She should not drive until she is seizure-free for 6 months. She has not driven in 13 years She will return in 6 months with NP or me    Renise Gillies, MD    07/24/2015, 10:04 AM [facility] [address] [place], [place] [address] [phone]

## 2015-07-24 NOTE — Patient Instructions (Signed)
Please remember to try to maintain good sleep hygiene, which means: Keep a regular sleep and wake schedule, try not to exercise or have a meal within 2 hours of your bedtime, try to keep your bedroom conducive for sleep, that is, cool and dark, without light distractors such as an illuminated alarm clock, and refrain from watching TV right before sleep or in the middle of the night and do not keep the TV or radio on during the night. Also, try not to use or play on electronic devices at bedtime, such as your cell phone, tablet PC or laptop. If you like to read at bedtime on an electronic device, try to dim the background light as much as possible. Do not eat in the middle of the night.   We will request a sleep study.    We will look for leg twitching and snoring or sleep apnea.   For chronic insomnia, you are best followed by a psychiatrist and/or sleep psychologist.   We will call you with the sleep study results and make a follow up appointment if needed.     Insomnia Insomnia is a sleep disorder that makes it difficult to fall asleep or to stay asleep. Insomnia can cause tiredness (fatigue), low energy, difficulty concentrating, mood swings, and poor performance at work or school.  There are three different ways to classify insomnia:  Difficulty falling asleep.  Difficulty staying asleep.  Waking up too early in the morning. Any type of insomnia can be long-term (chronic) or short-term (acute). Both are common. Short-term insomnia usually lasts for three months or less. Chronic insomnia occurs at least three times a week for longer than three months. CAUSES  Insomnia may be caused by another condition, situation, or substance, such as:  Anxiety.  Certain medicines.  Gastroesophageal reflux disease (GERD) or other gastrointestinal conditions.  Asthma or other breathing conditions.  Restless legs syndrome, sleep apnea, or other sleep disorders.  Chronic pain.  Menopause.  This may include hot flashes.  Stroke.  Abuse of alcohol, tobacco, or illegal drugs.  Depression.  Caffeine.   Neurological disorders, such as Alzheimer disease.  An overactive thyroid (hyperthyroidism). The cause of insomnia may not be known. RISK FACTORS Risk factors for insomnia include:  Gender. Women are more commonly affected than men.  Age. Insomnia is more common as you get older.  Stress. This may involve your professional or personal life.  Income. Insomnia is more common in people with lower income.  Lack of exercise.   Irregular work schedule or night shifts.  Traveling between different time zones. SIGNS AND SYMPTOMS If you have insomnia, trouble falling asleep or trouble staying asleep is the main symptom. This may lead to other symptoms, such as:  Feeling fatigued.  Feeling nervous about going to sleep.  Not feeling rested in the morning.  Having trouble concentrating.  Feeling irritable, anxious, or depressed. TREATMENT  Treatment for insomnia depends on the cause. If your insomnia is caused by an underlying condition, treatment will focus on addressing the condition. Treatment may also include:   Medicines to help you sleep.  Counseling or therapy.  Lifestyle adjustments. HOME CARE INSTRUCTIONS   Take medicines only as directed by your health care provider.  Keep regular sleeping and waking hours. Avoid naps.  Keep a sleep diary to help you and your health care provider figure out what could be causing your insomnia. Include:   When you sleep.  When you wake up during the night.    How well you sleep.   How rested you feel the next day.  Any side effects of medicines you are taking.  What you eat and drink.   Make your bedroom a comfortable place where it is easy to fall asleep:  Put up shades or special blackout curtains to block light from outside.  Use a white noise machine to block noise.  Keep the temperature cool.    Exercise regularly as directed by your health care provider. Avoid exercising right before bedtime.  Use relaxation techniques to manage stress. Ask your health care provider to suggest some techniques that may work well for you. These may include:  Breathing exercises.  Routines to release muscle tension.  Visualizing peaceful scenes.  Cut back on alcohol, caffeinated beverages, and cigarettes, especially close to bedtime. These can disrupt your sleep.  Do not overeat or eat spicy foods right before bedtime. This can lead to digestive discomfort that can make it hard for you to sleep.  Limit screen use before bedtime. This includes:  Watching TV.  Using your smartphone, tablet, and computer.  Stick to a routine. This can help you fall asleep faster. Try to do a quiet activity, brush your teeth, and go to bed at the same time each night.  Get out of bed if you are still awake after 15 minutes of trying to sleep. Keep the lights down, but try reading or doing a quiet activity. When you feel sleepy, go back to bed.  Make sure that you drive carefully. Avoid driving if you feel very sleepy.  Keep all follow-up appointments as directed by your health care provider. This is important. SEEK MEDICAL CARE IF:   You are tired throughout the day or have trouble in your daily routine due to sleepiness.  You continue to have sleep problems or your sleep problems get worse. SEEK IMMEDIATE MEDICAL CARE IF:   You have serious thoughts about hurting yourself or someone else.   This information is not intended to replace advice given to you by your health care provider. Make sure you discuss any questions you have with your health care provider.   Document Released: 12/28/1999 Document Revised: 09/20/2014 Document Reviewed: 09/30/2013 Elsevier Interactive Patient Education [employer].

## 2015-07-24 NOTE — Addendum Note (Signed)
Addended by: [name] on: 07/24/2015 10:35 AM   Modules accepted: Orders

## 2015-07-26 ENCOUNTER — Encounter: Payer: Self-pay | Admitting: Family Medicine

## 2015-07-26 ENCOUNTER — Ambulatory Visit (INDEPENDENT_AMBULATORY_CARE_PROVIDER_SITE_OTHER): Payer: PPO | Admitting: Family Medicine

## 2015-07-26 VITALS — BP 115/86 | HR 85 | Temp 98.4°F | Ht 60.0 in | Wt 169.0 lb

## 2015-07-26 DIAGNOSIS — L500 Allergic urticaria: Secondary | ICD-10-CM | POA: Insufficient documentation

## 2015-07-26 LAB — LAMOTRIGINE LEVEL: Lamotrigine Lvl: 5.8 ug/mL (ref 2.0–20.0)

## 2015-07-26 MED ORDER — METHYLPREDNISOLONE ACETATE 80 MG/ML IJ SUSP
80.0000 mg | Freq: Once | INTRAMUSCULAR | Status: AC
  Administered 2015-07-26: 80 mg via INTRAMUSCULAR

## 2015-07-26 NOTE — Patient Instructions (Signed)
Great to see you!  Be on the look out for what you are allergic to  Try a daily antihistamine, plain clartitin daily as well. If you are not getting relief take 1 pepcid twice daily as well.   Hives Hives are itchy, red, swollen areas of the skin. They can vary in size and location on your body. Hives can come and go for hours or several days (acute hives) or for several weeks (chronic hives). Hives do not spread from person to person (noncontagious). They may get worse with scratching, exercise, and emotional stress. CAUSES   Allergic reaction to food, additives, or drugs.  Infections, including the common cold.  Illness, such as vasculitis, lupus, or thyroid disease.  Exposure to sunlight, heat, or cold.  Exercise.  Stress.  Contact with chemicals. SYMPTOMS   Red or white swollen patches on the skin. The patches may change size, shape, and location quickly and repeatedly.  Itching.  Swelling of the hands, feet, and face. This may occur if hives develop deeper in the skin. DIAGNOSIS  Your caregiver can usually tell what is wrong by performing a physical exam. Skin or blood tests may also be done to determine the cause of your hives. In some cases, the cause cannot be determined. TREATMENT  Mild cases usually get better with medicines such as antihistamines. Severe cases may require an emergency epinephrine injection. If the cause of your hives is known, treatment includes avoiding that trigger.  HOME CARE INSTRUCTIONS   Avoid causes that trigger your hives.  Take antihistamines as directed by your caregiver to reduce the severity of your hives. Non-sedating or low-sedating antihistamines are usually recommended. Do not drive while taking an antihistamine.  Take any other medicines prescribed for itching as directed by your caregiver.  Wear loose-fitting clothing.  Keep all follow-up appointments as directed by your caregiver. SEEK MEDICAL CARE IF:   You have persistent  or severe itching that is not relieved with medicine.  You have painful or swollen joints. SEEK IMMEDIATE MEDICAL CARE IF:   You have a fever.  Your tongue or lips are swollen.  You have trouble breathing or swallowing.  You feel tightness in the throat or chest.  You have abdominal pain. These problems may be the first sign of a life-threatening allergic reaction. Call your local emergency services (911 in [place] MAKE SURE YOU:   Understand these instructions.  Will watch your condition.  Will get help right away if you are not doing well or get worse.   This information is not intended to replace advice given to you by your health care provider. Make sure you discuss any questions you have with your health care provider.   Document Released: 12/30/2004 Document Revised: 01/04/2013 Document Reviewed: 03/25/2011 Elsevier Interactive Patient Education [employer].

## 2015-07-26 NOTE — Addendum Note (Signed)
Addended by: Thana Ates on: 07/26/2015 05:41 PM   Modules accepted: Orders

## 2015-07-26 NOTE — Progress Notes (Signed)
   HPI  Patient presents today here with rash.  Patient's when she's had an itchy red rash for 2 weeks, it's located on her bilateral extensor surfaces of her arms, her bilateral legs, and upper right arm.  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 She has not been exposed to any poison ivy or poison oak. She denies any new lotions, soaps, perfumes. She has not tried anything but hydrocortisone cream. No real improvement with this.  She denies fever, chills, sweats, or other signs of illness.  She has had some soreness of the right ear.  PMH: Smoking status noted ROS: Per HPI  Objective: BP 115/86 mmHg  Pulse 85  Temp(Src) 98.4 F (36.9 C) (Oral)  Ht 5' (1.524 m)  Wt 169 lb (76.658 kg)  BMI 33.01 kg/m2 Gen: NAD, alert, cooperative with exam HEENT: NCAT, TMs normal bilaterally CV: RRR, good S1/S2, no murmur Resp: CTABL, no wheezes, non-labored Ext: No edema, warm Neuro: Alert and oriented, No gross deficits  Assessment and plan:  # Allergic urticaria Treat with IM Depo-Medrol, start daily antihistamine 7 days, if no improvement add Pepcid twice daily Bee on the lookout for allergic source Also consider arthropod bite versus eczema which should be improved also from similar treatment Return to clinic as needed   Laroy Apple, MD [facility] 07/26/2015, 5:31 PM

## 2015-07-30 ENCOUNTER — Telehealth: Payer: Self-pay | Admitting: Family Medicine

## 2015-07-30 ENCOUNTER — Other Ambulatory Visit: Payer: Self-pay | Admitting: Neurology

## 2015-07-30 MED ORDER — GABAPENTIN 600 MG PO TABS: 600.0000 mg | ORAL_TABLET | Freq: Three times a day (TID) | ORAL | Status: DC

## 2015-07-30 NOTE — Telephone Encounter (Signed)
Number busy x 2

## 2015-07-30 NOTE — Telephone Encounter (Signed)
Sent in refill

## 2015-08-02 ENCOUNTER — Encounter: Payer: Self-pay | Admitting: Nurse Practitioner

## 2015-08-02 ENCOUNTER — Ambulatory Visit (INDEPENDENT_AMBULATORY_CARE_PROVIDER_SITE_OTHER): Payer: PPO | Admitting: Nurse Practitioner

## 2015-08-02 VITALS — BP 109/78 | HR 74 | Temp 97.0°F | Ht 60.0 in | Wt 168.0 lb

## 2015-08-02 DIAGNOSIS — L309 Dermatitis, unspecified: Secondary | ICD-10-CM | POA: Diagnosis not present

## 2015-08-02 MED ORDER — PREDNISONE 20 MG PO TABS: ORAL_TABLET | ORAL | Status: DC

## 2015-08-02 NOTE — Patient Instructions (Signed)
Contact Dermatitis Dermatitis is redness, soreness, and swelling (inflammation) of the skin. Contact dermatitis is a reaction to certain substances that touch the skin. You either touched something that irritated your skin, or you have allergies to something you touched.  HOME CARE  Skin Care  Moisturize your skin as needed.  Apply cool compresses to the affected areas.   Try taking a bath with:   Epsom salts. Follow the instructions on the package. You can get these at a pharmacy or grocery store.   Baking soda. Pour a small amount into the bath as told by your doctor.   Colloidal oatmeal. Follow the instructions on the package. You can get this at a pharmacy or grocery store.   Try applying baking soda paste to your skin. Stir water into baking soda until it looks like paste.  Do not scratch your skin.   Bathe less often.  Bathe in lukewarm water. Avoid using hot water.  Medicines  Take or apply over-the-counter and prescription medicines only as told by your doctor.   If you were prescribed an antibiotic medicine, take or apply your antibiotic as told by your doctor. Do not stop taking the antibiotic even if your condition starts to get better. General Instructions  Keep all follow-up visits as told by your doctor. This is important.   Avoid the substance that caused your reaction. If you do not know what caused it, keep a journal to try to track what caused it. Write down:   What you eat.   What cosmetic products you use.   What you drink.   What you wear in the affected area. This includes jewelry.   If you were given a bandage (dressing), take care of it as told by your doctor. This includes when to change and remove it.  GET HELP IF:   You do not get better with treatment.   Your condition gets worse.   You have signs of infection such as:  Swelling.  Tenderness.  Redness.  Soreness.  Warmth.   You have a fever.   You have new  symptoms.  GET HELP RIGHT AWAY IF:   You have a very bad headache.  You have neck pain.  Your neck is stiff.   You throw up (vomit).   You feel very sleepy.   You see red streaks coming from the affected area.   Your bone or joint underneath the affected area becomes painful after the skin has healed.   The affected area turns darker.   You have trouble breathing.    This information is not intended to replace advice given to you by your health care provider. Make sure you discuss any questions you have with your health care provider.   Document Released: 10/27/2008 Document Revised: 09/20/2014 Document Reviewed: 05/17/2014 Elsevier Interactive Patient Education 2016 Elsevier Inc[employer]  

## 2015-08-02 NOTE — Progress Notes (Signed)
   Subjective:    Patient ID: [name], female    DOB: 01-10-1966, 50 y.o.   MRN: [id number]  HPI Patient in today C/O rash-Started about 2 weeks ago- saw Dr. Wendi Snipes 2 weeks ago- was dx with allergic urticaria and was given a depo medrol injection- never completely cleared up but yesterday broke out on lower back and started itching.    Review of Systems  Constitutional: Negative.   HENT: Negative.   Respiratory: Negative.   Cardiovascular: Negative.   Genitourinary: Negative.   Neurological: Negative.   Psychiatric/Behavioral: Negative.   All other systems reviewed and are negative.      Objective:   Physical Exam  Constitutional: She is oriented to person, place, and time. She appears well-developed and well-nourished.  Cardiovascular: Normal rate, regular rhythm and normal heart sounds.   Pulmonary/Chest: Effort normal and breath sounds normal.  Neurological: She is alert and oriented to person, place, and time.  Skin: Rash (maculopapular rash mid back from pant line up to bra line.) noted.  Psychiatric: She has a normal mood and affect. Her behavior is normal. Judgment and thought content normal.   BP 109/78 mmHg  Pulse 74  Temp(Src) 97 F (36.1 C) (Oral)  Ht 5' (1.524 m)  Wt 168 lb (76.204 kg)  BMI 32.81 kg/m2       Assessment & Plan:   1. Dermatitis    Avoid scratching Cool compresses Cortisone cream BID Meds ordered this encounter  Medications  . predniSONE (DELTASONE) 20 MG tablet    Sig: 2 po at sametime daily for 5 days    Dispense:  10 tablet    Refill:  0    Order Specific Question:  Supervising Provider    Answer:  Eustaquio Maize [[id number]]    [name], FNP

## 2015-08-14 ENCOUNTER — Ambulatory Visit (INDEPENDENT_AMBULATORY_CARE_PROVIDER_SITE_OTHER): Payer: PPO | Admitting: Neurology

## 2015-08-14 DIAGNOSIS — Z8669 Personal history of other diseases of the nervous system and sense organs: Secondary | ICD-10-CM

## 2015-08-14 DIAGNOSIS — R0683 Snoring: Secondary | ICD-10-CM

## 2015-08-14 DIAGNOSIS — G40201 Localization-related (focal) (partial) symptomatic epilepsy and epileptic syndromes with complex partial seizures, not intractable, with status epilepticus: Secondary | ICD-10-CM

## 2015-08-14 DIAGNOSIS — Z87898 Personal history of other specified conditions: Secondary | ICD-10-CM

## 2015-08-14 DIAGNOSIS — G4730 Sleep apnea, unspecified: Secondary | ICD-10-CM

## 2015-08-14 DIAGNOSIS — G4700 Insomnia, unspecified: Secondary | ICD-10-CM

## 2015-08-20 ENCOUNTER — Other Ambulatory Visit: Payer: Self-pay | Admitting: Neurology

## 2015-08-20 ENCOUNTER — Other Ambulatory Visit: Payer: Self-pay | Admitting: Family Medicine

## 2015-08-23 ENCOUNTER — Other Ambulatory Visit: Payer: Self-pay | Admitting: Pediatrics

## 2015-08-23 DIAGNOSIS — F321 Major depressive disorder, single episode, moderate: Secondary | ICD-10-CM

## 2015-08-28 DIAGNOSIS — L03031 Cellulitis of right toe: Secondary | ICD-10-CM | POA: Diagnosis not present

## 2015-08-29 ENCOUNTER — Telehealth: Payer: Self-pay

## 2015-08-29 NOTE — Telephone Encounter (Signed)
I spoke to pt regarding her sleep study results. I advised her that her study does not reveal significant sleep apnea or significant periodic limb movements of sleep resulting in significant sleep disruption.  Snoring was noted but did not cause arousals.  I advised her that she had prolonged sleep periods with low oxygen saturation and Dr. Brett Fairy recommends a pulmonologist consult for hypoxia as needed, and that oxygen at 2-4 lpm may be of help. Pt declined this referral at this time because she is having knee surgery and wants to wait until that is over to see a new doctor. I advised pt that her EEG was unremarkable. I advised pt to lose weight, diet, and exercise if not contraindicated by her other physicians. I advised her that she may consider an ENT examination or dental device as clinically indicated for snoring therapy. Pt says that she is not taking any narcotics at this time so she cannot reduce that dose. I advised pt to avoid caffeine containing beverages and chocolate. Pt will call for a follow up appt with Dr. Brett Fairy. Pt verbalized understanding of results. Pt had no questions at this time but was encouraged to call back if questions arise. Will fax a copy of sleep study to Dr. Livia Snellen. Pt verbalized understanding.

## 2015-09-02 DIAGNOSIS — R45851 Suicidal ideations: Secondary | ICD-10-CM | POA: Diagnosis not present

## 2015-09-02 DIAGNOSIS — T40601A Poisoning by unspecified narcotics, accidental (unintentional), initial encounter: Secondary | ICD-10-CM | POA: Diagnosis not present

## 2015-09-02 DIAGNOSIS — Z79899 Other long term (current) drug therapy: Secondary | ICD-10-CM | POA: Diagnosis not present

## 2015-09-02 DIAGNOSIS — T402X2A Poisoning by other opioids, intentional self-harm, initial encounter: Secondary | ICD-10-CM | POA: Diagnosis not present

## 2015-09-02 DIAGNOSIS — Z880 Allergy status to penicillin: Secondary | ICD-10-CM | POA: Diagnosis not present

## 2015-09-02 DIAGNOSIS — G40909 Epilepsy, unspecified, not intractable, without status epilepticus: Secondary | ICD-10-CM | POA: Diagnosis not present

## 2015-09-02 DIAGNOSIS — Z885 Allergy status to narcotic agent status: Secondary | ICD-10-CM | POA: Diagnosis not present

## 2015-09-02 DIAGNOSIS — X58XXXA Exposure to other specified factors, initial encounter: Secondary | ICD-10-CM | POA: Diagnosis not present

## 2015-09-02 DIAGNOSIS — F329 Major depressive disorder, single episode, unspecified: Secondary | ICD-10-CM | POA: Diagnosis not present

## 2015-09-03 DIAGNOSIS — G40909 Epilepsy, unspecified, not intractable, without status epilepticus: Secondary | ICD-10-CM | POA: Diagnosis not present

## 2015-09-03 DIAGNOSIS — F329 Major depressive disorder, single episode, unspecified: Secondary | ICD-10-CM | POA: Diagnosis not present

## 2015-09-05 ENCOUNTER — Ambulatory Visit (INDEPENDENT_AMBULATORY_CARE_PROVIDER_SITE_OTHER): Payer: PPO | Admitting: Family Medicine

## 2015-09-05 ENCOUNTER — Encounter: Payer: Self-pay | Admitting: Family Medicine

## 2015-09-05 VITALS — BP 109/72 | HR 82 | Temp 97.3°F | Ht 60.0 in | Wt 171.6 lb

## 2015-09-05 DIAGNOSIS — F321 Major depressive disorder, single episode, moderate: Secondary | ICD-10-CM

## 2015-09-05 DIAGNOSIS — M1711 Unilateral primary osteoarthritis, right knee: Secondary | ICD-10-CM

## 2015-09-05 MED ORDER — PREGABALIN 100 MG PO CAPS: 100.0000 mg | ORAL_CAPSULE | Freq: Two times a day (BID) | ORAL | 2 refills | Status: DC

## 2015-09-05 MED ORDER — QUETIAPINE FUMARATE ER 200 MG PO TB24: 200.0000 mg | ORAL_TABLET | Freq: Every day | ORAL | 2 refills | Status: DC

## 2015-09-05 NOTE — Progress Notes (Signed)
Subjective:  Patient ID: [name], female    DOB: 1965-10-26  Age: 50 y.o. MRN: [id number]  CC: Depression (worsening)   HPI HEART SCHREITER presents for read that lyrica could hgelp with her pain. Due for knee replacement soon. Gabapentin not giving significant pain relief anymore.Pt. Feels that pain plus watching her husband struggle with such severe health problems has led to feeling depressed. Sad, apprehensive, no pleasure in activities. Feels life not worth living, but wants to hang on for her husband's sake. Has taken multiple antidepressants over time. Needs something stronger than celexa.  Depression screen [facility] 2/9 09/05/2015 08/02/2015 07/26/2015 05/03/2015 04/09/2015  Decreased Interest 3 2 0 0 0  Down, Depressed, Hopeless 3 2 0 0 0  PHQ - 2 Score 6 4 0 0 0  Altered sleeping 2 3 - - -  Tired, decreased energy 2 3 - - -  Change in appetite 1 2 - - -  Feeling bad or failure about yourself  3 2 - - -  Trouble concentrating 2 3 - - -  Moving slowly or fidgety/restless 1 1 - - -  Suicidal thoughts 3 1 - - -  PHQ-9 Score 20 19 - - -  Difficult doing work/chores Extremely dIfficult - - - -   Planning to go to [facility] for counseling tomorrow.  History Tessi has a past medical history of Arthritis; Bronchitis; Chronic bronchitis ([place]); Chronic kidney disease; Colon polyps; Constipation; Depression; Eczema; Epilepsy ([place]); Fatty liver; Headache(784.0); High cholesterol; Osteoarthritis; Other convulsions (05/21/12); Rectal bleed; Restless leg; Seizures ([place]); and Vertigo.   She has a past surgical history that includes Total hip arthroplasty (Left, 1993; 1995; 2000); Abdominal hysterectomy (2001); Colonoscopy; Joint replacement; Mass excision (10/22/2011); Bladder suspension; Cesarean section [id number]; 1988); Bunionectomy (Left, 2000); and Total knee arthroplasty (Left, 05/07/2015).   Her family history includes Cancer in her brother, maternal aunt, and mother; Diabetes in her brother  and maternal aunt.She reports that she has never smoked. She has never used smokeless tobacco. She reports that she does not drink alcohol or use drugs.    ROS Review of Systems  Constitutional: Negative for activity change, appetite change and fever.  HENT: Negative for congestion, rhinorrhea and sore throat.   Eyes: Negative for visual disturbance.  Respiratory: Negative for cough and shortness of breath.   Cardiovascular: Negative for chest pain and palpitations.  Gastrointestinal: Negative for abdominal pain, diarrhea and nausea.  Genitourinary: Negative for dysuria.  Musculoskeletal: Negative for arthralgias and myalgias.  Psychiatric/Behavioral: Positive for agitation, decreased concentration, dysphoric mood and suicidal ideas. Negative for self-injury and sleep disturbance. The patient is not hyperactive.     Objective:  BP 109/72 (BP Location: Left Arm, Patient Position: Sitting, Cuff Size: Normal)   Pulse 82   Temp 97.3 F (36.3 C) (Oral)   Ht 5' (1.524 m)   Wt 171 lb 9.6 oz (77.8 kg)   SpO2 98%   BMI 33.51 kg/m   BP Readings from Last 3 Encounters:  09/05/15 109/72  08/02/15 109/78  07/26/15 115/86    Wt Readings from Last 3 Encounters:  09/05/15 171 lb 9.6 oz (77.8 kg)  08/02/15 168 lb (76.2 kg)  07/26/15 169 lb (76.7 kg)     Physical Exam  Constitutional: She is oriented to person, place, and time. She appears well-developed and well-nourished. No distress.  HENT:  Head: Normocephalic and atraumatic.  Right Ear: External ear normal.  Left Ear: External ear normal.  Nose: Nose normal.  Mouth/Throat: Oropharynx is clear and moist.  Eyes: Conjunctivae and EOM are normal. Pupils are equal, round, and reactive to light.  Neck: Normal range of motion. Neck supple. No thyromegaly present.  Cardiovascular: Normal rate, regular rhythm and normal heart sounds.   No murmur heard. Pulmonary/Chest: Effort normal and breath sounds normal. No respiratory distress.  She has no wheezes. She has no rales.  Abdominal: Soft. Bowel sounds are normal. She exhibits no distension. There is no tenderness.  Lymphadenopathy:    She has no cervical adenopathy.  Neurological: She is alert and oriented to person, place, and time. She has normal reflexes.  Skin: Skin is warm and dry.  Psychiatric: She has a normal mood and affect. Her behavior is normal. Judgment and thought content normal.   Results for orders placed or performed in visit on 07/24/15  Lamotrigine level  Result Value Ref Range   Lamotrigine Lvl 5.8 2.0 - 20.0 ug/mL     Lab Results  Component Value Date   WBC 16.4 (H) 05/09/2015   HGB 11.1 (L) 05/09/2015   HCT 33.3 (L) 05/09/2015   PLT 342 05/09/2015   GLUCOSE 104 (H) 05/09/2015   CHOL 217 (H) 04/09/2015   TRIG 156 (H) 04/09/2015   HDL 50 04/09/2015   LDLCALC 136 (H) 04/09/2015   ALT 35 (H) 05/01/2015   AST 25 05/01/2015   NA 142 05/09/2015   K 4.0 05/09/2015   CL 104 05/09/2015   CREATININE 0.65 05/09/2015   BUN 14 05/09/2015   CO2 30 05/09/2015   TSH 0.661 01/31/2015   INR 1.10 05/04/2015   HGBA1C 5.4 09/08/2013    No results found.  Assessment & Plan:   Erilynn was seen today for depression.  Diagnoses and all orders for this visit:  Moderate major depression ([place])  Primary osteoarthritis of right knee  Other orders -     pregabalin (LYRICA) 100 MG capsule; Take 1 capsule (100 mg total) by mouth 2 (two) times daily. -     QUEtiapine (SEROQUEL XR) 200 MG 24 hr tablet; Take 1 tablet (200 mg total) by mouth at bedtime.    I have discontinued Ms. Strupp's predniSONE and gabapentin. I am also having her start on pregabalin and QUEtiapine. Additionally, I am having her maintain her clonazePAM, levETIRAcetam, lamoTRIgine, pramipexole, citalopram, and cephALEXin.  Meds ordered this encounter  Medications  . cephALEXin (KEFLEX) 500 MG capsule    Sig: Take 500 mg by mouth 3 (three) times daily.   . pregabalin (LYRICA) 100  MG capsule    Sig: Take 1 capsule (100 mg total) by mouth 2 (two) times daily.    Dispense:  60 capsule    Refill:  2  . QUEtiapine (SEROQUEL XR) 200 MG 24 hr tablet    Sig: Take 1 tablet (200 mg total) by mouth at bedtime.    Dispense:  90 tablet    Refill:  2     Follow-up: Return in about 2 weeks (around 09/19/2015).  [name], M.D.

## 2015-09-06 ENCOUNTER — Telehealth: Payer: Self-pay | Admitting: Family Medicine

## 2015-09-06 NOTE — Telephone Encounter (Signed)
She thinks the Seroquel is going to make her fatty liver worse and doesn't think she should be taking it. She only wants to speak with you and knows you are out of the office until tomorrow.

## 2015-09-08 NOTE — Telephone Encounter (Signed)
I spoke to the patient. I pointed out to her that her hepatic steatosis was mild 3 years ago. Her weight is the most relevant thing to her liver function. I do not think that any significant harm would come to her based on hepatic steatosis and the use of Seroquel. She wants me to discuss that with her mother. I recommended that she and her mother come in for a consultation together.

## 2015-09-11 ENCOUNTER — Other Ambulatory Visit: Payer: Self-pay | Admitting: Family Medicine

## 2015-09-13 ENCOUNTER — Ambulatory Visit: Payer: Self-pay | Admitting: Orthopedic Surgery

## 2015-09-17 ENCOUNTER — Emergency Department (HOSPITAL_COMMUNITY)
Admission: EM | Admit: 2015-09-17 | Discharge: 2015-09-17 | Disposition: A | Discharge status: Home / Self Care | Payer: PPO | Attending: Emergency Medicine | Admitting: Emergency Medicine

## 2015-09-17 ENCOUNTER — Other Ambulatory Visit: Payer: Self-pay | Admitting: Neurology

## 2015-09-17 ENCOUNTER — Emergency Department (HOSPITAL_COMMUNITY): Payer: PPO

## 2015-09-17 ENCOUNTER — Other Ambulatory Visit: Payer: Self-pay | Admitting: Family Medicine

## 2015-09-17 ENCOUNTER — Encounter (HOSPITAL_COMMUNITY): Payer: Self-pay

## 2015-09-17 DIAGNOSIS — Z79899 Other long term (current) drug therapy: Secondary | ICD-10-CM | POA: Diagnosis not present

## 2015-09-17 DIAGNOSIS — N189 Chronic kidney disease, unspecified: Secondary | ICD-10-CM | POA: Diagnosis not present

## 2015-09-17 DIAGNOSIS — M25462 Effusion, left knee: Secondary | ICD-10-CM | POA: Diagnosis not present

## 2015-09-17 DIAGNOSIS — G40909 Epilepsy, unspecified, not intractable, without status epilepticus: Secondary | ICD-10-CM | POA: Diagnosis not present

## 2015-09-17 DIAGNOSIS — R569 Unspecified convulsions: Secondary | ICD-10-CM | POA: Diagnosis not present

## 2015-09-17 LAB — I-STAT CHEM 8, ED
BUN: 5 mg/dL — ABNORMAL LOW (ref 6–20)
Calcium, Ion: 1.11 mmol/L — ABNORMAL LOW (ref 1.15–1.40)
Chloride: 107 mmol/L (ref 101–111)
Creatinine, Ser: 0.6 mg/dL (ref 0.44–1.00)
Glucose, Bld: 85 mg/dL (ref 65–99)
HCT: 38 % (ref 36.0–46.0)
Hemoglobin: 12.9 g/dL (ref 12.0–15.0)
Potassium: 3.8 mmol/L (ref 3.5–5.1)
Sodium: 144 mmol/L (ref 135–145)
TCO2: 24 mmol/L (ref 0–100)

## 2015-09-17 LAB — CBG MONITORING, ED: Glucose-Capillary: 83 mg/dL (ref 65–99)

## 2015-09-17 MED ORDER — LEVETIRACETAM 750 MG PO TABS
1500.0000 mg | ORAL_TABLET | Freq: Once | ORAL | Status: AC
  Administered 2015-09-17: 1500 mg via ORAL
  Filled 2015-09-17: qty 2

## 2015-09-17 NOTE — ED Triage Notes (Signed)
Per [facility], Pt, from home, presents after multiple seizures.  Pt c/o leg cramping after seizure.  Pt, currently, A & Ox4.  Pt received 381mL NS en route.  CBG 87 en route.

## 2015-09-17 NOTE — ED Provider Notes (Signed)
[facility] DEPT Provider Note   CSN: [id number] Arrival date & time: 09/17/15  1544     History   Chief Complaint Chief Complaint  Patient presents with  . Seizures    HPI [name] is a 50 y.o. female.  The history is provided by the patient, the EMS personnel and a relative.  Seizures   This is a recurrent problem. The current episode started 3 to 5 hours ago. The problem has been resolved. There was 1 seizure. The most recent episode lasted 2 to 5 minutes. Associated symptoms include confusion and speech difficulty. Characteristics include rhythmic jerking and loss of consciousness. The episode was witnessed. The seizures did not continue in the ED. The seizure(s) had no focality. There has been no fever. There were no medications administered prior to arrival.    Past Medical History:  Diagnosis Date  . Arthritis    "knees" (04/22/2012)  . Bronchitis   . Chronic bronchitis ([place])    "yearly; when the weather changes" (04/22/2012)  . Chronic kidney disease   . Colon polyps    adenomatous and hyperplastic-  . Constipation   . Depression   . Eczema   . Epilepsy ([place])    "been having them right often here lately" (04/22/2012)  . Fatty liver   . [id number])    "1/wk" (04/22/2012)  . High cholesterol   . Osteoarthritis    [name] 04/22/2012  . Other convulsions 05/21/12   non-epileptic spells  . Rectal bleed    in toilet- bright red  . Restless leg   . Seizures ([place])   . Vertigo     Patient Active Problem List   Diagnosis Date Noted  . Partial symptomatic epilepsy with complex partial seizures, not intractable, with status epilepticus ([place]) 07/24/2015  . Insomnia w/ sleep apnea 07/24/2015  . OA (osteoarthritis) of knee 05/07/2015  . Memory loss 02/20/2014  . Moderate major depression ([place]) 10/14/2010    Past Surgical History:  Procedure Laterality Date  . ABDOMINAL HYSTERECTOMY  2001  . BLADDER SUSPENSION    . BUNIONECTOMY Left 2000  . CESAREAN  SECTION  1987; 1988  . COLONOSCOPY    . JOINT REPLACEMENT    . MASS EXCISION  10/22/2011   Procedure: EXCISION MASS;  Surgeon: [name], MD;  Location: [facility];  Service: General;  Laterality: Right;  excision right buttock mass  . TOTAL HIP ARTHROPLASTY Left 1993; 1995; 2000  . TOTAL KNEE ARTHROPLASTY Left 05/07/2015   Procedure: TOTAL LEFT KNEE ARTHROPLASTY;  Surgeon: [name], MD;  Location: [facility];  Service: Orthopedics;  Laterality: Left;    OB History    No data available       Home Medications    Prior to Admission medications   Medication Sig Start Date End Date Taking? Authorizing Provider  cephALEXin (KEFLEX) 500 MG capsule Take 500 mg by mouth 3 (three) times daily.  08/28/15   Historical Provider, MD  citalopram (CELEXA) 40 MG tablet TAKE ONE TABLET BY MOUTH ONCE DAILY 08/23/15   [name], MD  clonazePAM (KLONOPIN) 0.5 MG tablet TAke one each morning and as needed for seizure. Take one at bedtime. Repeat one hour later if needed for sleep. Patient taking differently: TAke  as needed for seizure. Take one at bedtime. Repeat one hour later if needed for sleep. 01/31/15   [name], MD  gabapentin (NEURONTIN) 600 MG tablet TAKE ONE TABLET BY MOUTH THREE TIMES DAILY 09/12/15   [name], MD  lamoTRIgine (LAMICTAL) 100 MG tablet TAKE ONE TABLET BY MOUTH TWICE DAILY 08/20/15   [name], MD  levETIRAcetam (KEPPRA) 500 MG tablet Take 2 tablets PO in the morning and 3 tablets PO in the evening. 07/24/15   [name], MD  pramipexole (MIRAPEX) 0.5 MG tablet TAKE ONE TABLET BY MOUTH TWICE DAILY 08/20/15   [name], MD  pregabalin (LYRICA) 100 MG capsule Take 1 capsule (100 mg total) by mouth 2 (two) times daily. 09/05/15   [name], MD  QUEtiapine (SEROQUEL XR) 200 MG 24 hr tablet Take 1 tablet (200 mg total) by mouth at bedtime. 09/05/15   [name], MD    Family History Family History  Problem Relation Age of Onset  . Cancer Mother   .  Cancer Brother   . Diabetes Brother   . Cancer Maternal Aunt   . Diabetes Maternal Aunt     Social History Social History  Substance Use Topics  . Smoking status: Never Smoker  . Smokeless tobacco: Never Used  . Alcohol use No     Allergies   Acetaminophen; Benadryl [diphenhydramine hcl]; Codeine; Dilantin [phenytoin sodium extended]; Melatonin; Ultram [tramadol hcl]; Vimpat [lacosamide]; Betadine [povidone iodine]; Latex; Penicillins; Sulfa antibiotics; Tape; and Vicodin [hydrocodone-acetaminophen]   Review of Systems Review of Systems  Neurological: Positive for seizures, loss of consciousness and speech difficulty.  Psychiatric/Behavioral: Positive for confusion.  All other systems reviewed and are negative.    Physical Exam Updated Vital Signs BP 126/88 (BP Location: Right Arm)   Pulse 81   Resp 26   SpO2 97%   Physical Exam  Constitutional: She appears well-developed and well-nourished. No distress.  HENT:  Head: Normocephalic.  Eyes: Conjunctivae are normal.  Neck: Neck supple. No tracheal deviation present.  Cardiovascular: Normal rate, regular rhythm and normal heart sounds.   Pulmonary/Chest: Effort normal and breath sounds normal. No respiratory distress.  Abdominal: Soft. She exhibits no distension.  Neurological: She is alert. She has normal strength. She is disoriented (improved over ED course). No cranial nerve deficit. She displays no seizure activity. Coordination and gait normal. GCS eye subscore is 4. GCS verbal subscore is 5. GCS motor subscore is 6.  Skin: Skin is warm and dry.  Psychiatric: She has a normal mood and affect.     ED Treatments / Results  Labs (all labs ordered are listed, but only abnormal results are displayed) Labs Reviewed  I-STAT CHEM 8, ED - Abnormal; Notable for the following:       Result Value   BUN 5 (*)    Calcium, Ion 1.11 (*)    All other components within normal limits  CBG MONITORING, ED    EKG  EKG  Interpretation  Date/Time:  Monday September 17 2015 16:08:53 EDT Ventricular Rate:  82 PR Interval:    QRS Duration: 101 QT Interval:  405 QTC Calculation: 473 R Axis:   47 Text Interpretation:  Sinus rhythm Borderline T abnormalities, diffuse leads Since last tracing rate slower Otherwise no significant change Confirmed by Shetara Launer MD, Andreea Arca [phone]) on 09/17/2015 4:15:17 PM       Radiology Dg Knee Complete 4 Views Left  Result Date: 09/17/2015 CLINICAL DATA:  Status post fall today with a left knee injury. Pain. Initial encounter. EXAM: LEFT KNEE - COMPLETE 4+ VIEW COMPARISON:  Plain films left knee 11/14/2014. FINDINGS: The patient has undergone left knee replacement since the prior examination. Hardware is intact without complicating feature. No acute bony or joint abnormality is  seen. Small effusion is noted. IMPRESSION: No acute abnormality. Status post left knee replacement without evidence complication. Small joint effusion. Electronically Signed   By: Inge Rise M.D.   On: 09/17/2015 17:58    Procedures Procedures (including critical care time)  Medications Ordered in ED Medications - No data to display   Initial Impression / Assessment and Plan / ED Course  I have reviewed the triage vital signs and the nursing notes.  Pertinent labs & imaging results that were available during my care of the patient were reviewed by me and considered in my medical decision making (see chart for details).  Clinical Course    50 y.o. female presents with Reported seizure episode that occurred earlier this evening where she was lying in bed and had a typical episode of shaking and shouting. On arrival here she was not having any seizure activity. No significant hematologic or metabolic abnormalities to explain symptoms. During her course she had an apparent pseudoseizure episode that was aborted by sternal rub. She is given her nightly dose of Keppra and monitored for recurrent seizure  activity. Plain film performed for complaint of left knee pain without bony abnormality. The description of the episode earlier this evening had a postictal period and appeared to be more epileptic in nature than what was witnessed here. I recommended close follow-up with her primary neurologist to determine any changes to medications. Not in status, no indication for inpatient admission on this visit.  Final Clinical Impressions(s) / ED Diagnoses   Final diagnoses:  Seizures [facility])    New Prescriptions Discharge Medication List as of 09/17/2015  6:36 PM       Leo Grosser, MD 09/18/15 2357

## 2015-09-17 NOTE — ED Notes (Signed)
Pt is aware of need to give urine sample

## 2015-09-21 ENCOUNTER — Encounter: Payer: Self-pay | Admitting: Family Medicine

## 2015-09-21 ENCOUNTER — Ambulatory Visit (INDEPENDENT_AMBULATORY_CARE_PROVIDER_SITE_OTHER): Payer: PPO | Admitting: Family Medicine

## 2015-09-21 VITALS — BP 103/69 | HR 99 | Temp 97.4°F | Ht 60.0 in | Wt 172.2 lb

## 2015-09-21 DIAGNOSIS — F321 Major depressive disorder, single episode, moderate: Secondary | ICD-10-CM

## 2015-09-21 DIAGNOSIS — G4700 Insomnia, unspecified: Secondary | ICD-10-CM

## 2015-09-21 DIAGNOSIS — M1711 Unilateral primary osteoarthritis, right knee: Secondary | ICD-10-CM | POA: Diagnosis not present

## 2015-09-21 DIAGNOSIS — G4730 Sleep apnea, unspecified: Secondary | ICD-10-CM

## 2015-09-21 DIAGNOSIS — R739 Hyperglycemia, unspecified: Secondary | ICD-10-CM

## 2015-09-21 DIAGNOSIS — G40201 Localization-related (focal) (partial) symptomatic epilepsy and epileptic syndromes with complex partial seizures, not intractable, with status epilepticus: Secondary | ICD-10-CM

## 2015-09-21 LAB — BAYER DCA HB A1C WAIVED: HB A1C (BAYER DCA - WAIVED): 5.4 % (ref <7.0)

## 2015-09-21 MED ORDER — PREGABALIN 100 MG PO CAPS: 100.0000 mg | ORAL_CAPSULE | Freq: Two times a day (BID) | ORAL | 2 refills | Status: DC

## 2015-09-21 NOTE — Progress Notes (Signed)
Subjective:  Patient ID: [name], female    DOB: September 15, 1965  Age: 50 y.o. MRN: [id number]  CC: Medication Problem (pt here today and wants to discull medication. Pt hasn't taken any Seroquel because of her fatty liver and that the Seroquel will make it worse.) and Hospitalization Follow-up (pt was seen in ED at [facility] 9/4 for seizures)   HPI [name] presents for Recheck of her depression and anxiety. Symptoms are essentially unchanged. She did not try the Seroquel because of concern that it would hurt her liver. She has tried multiple antidepressants and has failed all of them. Her mom says that she has had some suicidal thoughts. Patient says she has in the past but does not have those now. She denies a plan. She said it is very hard to see her husband have such severe illness and be so severely disabled. She had a seizure several days ago and was seen at [facility] emergency department. That report was reviewed. She has been set up to see neurology on 912. It appears that her seizure medications were increased and need further attention.  She continues to have knee pain. The pain has not been adequately relieved by gabapentin. It also makes her too drowsy.   History [name] has a past medical history of Arthritis; Bronchitis; Chronic bronchitis ([name]); Chronic kidney disease; Colon polyps; Constipation; Depression; Eczema; Epilepsy ([name]); Fatty liver; Headache(784.0); High cholesterol; History of kidney stones; Osteoarthritis; Other convulsions (05/21/12); Rectal bleed; Restless leg; Seizures ([name]); and Vertigo.   She has a past surgical history that includes Total hip arthroplasty (Left, 1993; 1995; 2000); Abdominal hysterectomy (2001); Colonoscopy; Joint replacement; Mass excision (10/22/2011); Bladder suspension; Cesarean section [id number]; 1988); Bunionectomy (Left, 2000); and Total knee arthroplasty (Left, 05/07/2015).   Her family history includes Cancer in her brother, maternal  aunt, and mother; Diabetes in her brother and maternal aunt.She reports that she has never smoked. She has never used smokeless tobacco. She reports that she does not drink alcohol or use drugs.    ROS Review of Systems  Constitutional: Negative for activity change, appetite change and fever.  HENT: Negative for congestion, rhinorrhea and sore throat.   Eyes: Negative for visual disturbance.  Respiratory: Negative for cough and shortness of breath.   Cardiovascular: Negative for chest pain and palpitations.  Gastrointestinal: Negative for abdominal pain, diarrhea and nausea.  Genitourinary: Negative for dysuria.  Musculoskeletal: Negative for arthralgias and myalgias.  Neurological: Positive for seizures, weakness and headaches. Negative for speech difficulty.    Objective:  BP 103/69   Pulse 99   Temp 97.4 F (36.3 C) (Oral)   Ht 5' (1.524 m)   Wt 172 lb 4 oz (78.1 kg)   BMI 33.64 kg/m   BP Readings from Last 3 Encounters:  09/25/15 97/73  09/25/15 (!) 90/58  09/21/15 103/69    Wt Readings from Last 3 Encounters:  09/25/15 171 lb (77.6 kg)  09/25/15 174 lb (78.9 kg)  09/21/15 172 lb 4 oz (78.1 kg)     Physical Exam  Constitutional: She is oriented to person, place, and time. She appears well-developed and well-nourished. No distress.  HENT:  Head: Normocephalic and atraumatic.  Right Ear: External ear normal.  Left Ear: External ear normal.  Nose: Nose normal.  Mouth/Throat: Oropharynx is clear and moist.  Eyes: Conjunctivae and EOM are normal. Pupils are equal, round, and reactive to light.  Neck: Normal range of motion. Neck supple. No thyromegaly present.  Cardiovascular:  Normal rate, regular rhythm and normal heart sounds.   No murmur heard. Pulmonary/Chest: Effort normal and breath sounds normal. No respiratory distress. She has no wheezes. She has no rales.  Abdominal: Soft. Bowel sounds are normal. She exhibits no distension. There is no tenderness.    Lymphadenopathy:    She has no cervical adenopathy.  Neurological: She is alert and oriented to person, place, and time. She has normal reflexes.  Skin: Skin is warm and dry.  Psychiatric: Her behavior is normal. Thought content normal. Her mood appears anxious. Her speech is rapid and/or pressured. She expresses impulsivity and inappropriate judgment.     Lab Results  Component Value Date   WBC 7.4 09/25/2015   HGB 13.0 09/25/2015   HCT 39.0 09/25/2015   PLT 321 09/25/2015   GLUCOSE 90 09/25/2015   CHOL 217 (H) 04/09/2015   TRIG 156 (H) 04/09/2015   HDL 50 04/09/2015   LDLCALC 136 (H) 04/09/2015   ALT 68 (H) 09/25/2015   AST 65 (H) 09/25/2015   NA 139 09/25/2015   K 4.5 09/25/2015   CL 103 09/25/2015   CREATININE 0.72 09/25/2015   BUN 15 09/25/2015   CO2 29 09/25/2015   TSH 0.661 01/31/2015   INR 1.01 09/25/2015   HGBA1C 5.4 09/08/2013    Dg Knee Complete 4 Views Left  Result Date: 09/17/2015 CLINICAL DATA:  Status post fall today with a left knee injury. Pain. Initial encounter. EXAM: LEFT KNEE - COMPLETE 4+ VIEW COMPARISON:  Plain films left knee 11/14/2014. FINDINGS: The patient has undergone left knee replacement since the prior examination. Hardware is intact without complicating feature. No acute bony or joint abnormality is seen. Small effusion is noted. IMPRESSION: No acute abnormality. Status post left knee replacement without evidence complication. Small joint effusion. Electronically Signed   By: Inge Rise M.D.   On: 09/17/2015 17:58    Assessment & Plan:   [name] was seen today for medication problem and hospitalization follow-up.  Diagnoses and all orders for this visit:  Hyperglycemia -     Bayer DCA Hb A1c Waived  Insomnia w/ sleep apnea  Moderate major depression ([place]) -     Ambulatory referral to Psychiatry  Partial symptomatic epilepsy with complex partial seizures, not intractable, with status epilepticus ([place])  Primary osteoarthritis of  right knee  Other orders -     pregabalin (LYRICA) 100 MG capsule; Take 1 capsule (100 mg total) by mouth 2 (two) times daily.    I have discontinued [name]'s QUEtiapine and gabapentin. I am also having her maintain her clonazePAM, citalopram, lamoTRIgine, pramipexole, vitamin B-12, Multiple Vitamins-Minerals (MULTIVITAMIN ADULT PO), and pregabalin.  Meds ordered this encounter  Medications  . pregabalin (LYRICA) 100 MG capsule    Sig: Take 1 capsule (100 mg total) by mouth 2 (two) times daily.    Dispense:  60 capsule    Refill:  2     Follow-up: Return in about 3 months (around 12/21/2015), or if symptoms worsen or fail to improve.  [name], M.D.

## 2015-09-24 ENCOUNTER — Telehealth (HOSPITAL_COMMUNITY): Payer: Self-pay | Admitting: *Deleted

## 2015-09-24 NOTE — Telephone Encounter (Signed)
phone call, patient's daughter-in-law answered, said she is not there.   Would not provide another number for her to be reached.

## 2015-09-25 ENCOUNTER — Ambulatory Visit (INDEPENDENT_AMBULATORY_CARE_PROVIDER_SITE_OTHER): Payer: PPO | Admitting: Adult Health

## 2015-09-25 ENCOUNTER — Encounter: Payer: Self-pay | Admitting: Adult Health

## 2015-09-25 ENCOUNTER — Encounter (HOSPITAL_COMMUNITY)
Admission: RE | Admit: 2015-09-25 | Discharge: 2015-09-25 | Disposition: A | Discharge status: Home / Self Care | Payer: PPO | Source: Ambulatory Visit | Attending: Orthopedic Surgery | Admitting: Orthopedic Surgery

## 2015-09-25 ENCOUNTER — Telehealth (HOSPITAL_COMMUNITY): Payer: Self-pay | Admitting: *Deleted

## 2015-09-25 ENCOUNTER — Encounter (HOSPITAL_COMMUNITY): Payer: Self-pay

## 2015-09-25 VITALS — BP 90/58 | HR 92 | Resp 20 | Ht 60.0 in | Wt 174.0 lb

## 2015-09-25 DIAGNOSIS — G40919 Epilepsy, unspecified, intractable, without status epilepticus: Secondary | ICD-10-CM

## 2015-09-25 DIAGNOSIS — Z01818 Encounter for other preprocedural examination: Secondary | ICD-10-CM | POA: Insufficient documentation

## 2015-09-25 HISTORY — DX: Personal history of urinary calculi: Z87.442

## 2015-09-25 LAB — URINALYSIS, ROUTINE W REFLEX MICROSCOPIC
Bilirubin Urine: NEGATIVE
Glucose, UA: NEGATIVE mg/dL
Hgb urine dipstick: NEGATIVE
Ketones, ur: NEGATIVE mg/dL
Leukocytes, UA: NEGATIVE
Nitrite: NEGATIVE
Protein, ur: NEGATIVE mg/dL
Specific Gravity, Urine: 1.008 (ref 1.005–1.030)
pH: 6 (ref 5.0–8.0)

## 2015-09-25 LAB — CBC
HCT: 39 % (ref 36.0–46.0)
Hemoglobin: 13 g/dL (ref 12.0–15.0)
MCH: 29 pg (ref 26.0–34.0)
MCHC: 33.3 g/dL (ref 30.0–36.0)
MCV: 86.9 fL (ref 78.0–100.0)
Platelets: 321 10*3/uL (ref 150–400)
RBC: 4.49 MIL/uL (ref 3.87–5.11)
RDW: 14.4 % (ref 11.5–15.5)
WBC: 7.4 10*3/uL (ref 4.0–10.5)

## 2015-09-25 LAB — COMPREHENSIVE METABOLIC PANEL
ALT: 68 U/L — ABNORMAL HIGH (ref 14–54)
AST: 65 U/L — ABNORMAL HIGH (ref 15–41)
Albumin: 4.8 g/dL (ref 3.5–5.0)
Alkaline Phosphatase: 70 U/L (ref 38–126)
Anion gap: 7 (ref 5–15)
BUN: 15 mg/dL (ref 6–20)
CO2: 29 mmol/L (ref 22–32)
Calcium: 10.1 mg/dL (ref 8.9–10.3)
Chloride: 103 mmol/L (ref 101–111)
Creatinine, Ser: 0.72 mg/dL (ref 0.44–1.00)
GFR calc Af Amer: >60 mL/min (ref >60)
GFR calc non Af Amer: >60 mL/min (ref >60)
Glucose, Bld: 90 mg/dL (ref 65–99)
Potassium: 4.5 mmol/L (ref 3.5–5.1)
Sodium: 139 mmol/L (ref 135–145)
Total Bilirubin: 0.9 mg/dL (ref 0.3–1.2)
Total Protein: 7.4 g/dL (ref 6.5–8.1)

## 2015-09-25 LAB — APTT: aPTT: 31 seconds (ref 24–36)

## 2015-09-25 LAB — SURGICAL PCR SCREEN
MRSA, PCR: INVALID — AB
Staphylococcus aureus: INVALID — AB

## 2015-09-25 LAB — PROTIME-INR
INR: 1.01
Prothrombin Time: 13.3 seconds (ref 11.4–15.2)

## 2015-09-25 MED ORDER — LEVETIRACETAM 500 MG PO TABS: ORAL_TABLET | ORAL | 5 refills | Status: DC

## 2015-09-25 NOTE — Progress Notes (Signed)
PATIENT: [name] DOB: 1965-09-10  REASON FOR VISIT: follow up- seizures HISTORY FROM: patient  HISTORY OF PRESENT ILLNESS: [name] is a 50 year old female with a history of intractable seizures. She returns today for follow-up. She is currently taking Kepprathousand milligrams in the morning and 1500 mg in the evening. She is also on Lamictal 100 mg twice a day. She reports that she had a seizure September 4. She states that she lost consciousness and was jerking and in all 4 extremities. She denies missing any medication. Denies starting any new medication. Denies any changes with her medication. She reports that her primary care recently switched her to Lyrica she states this occurred after she had a seizure. She returns today for an evaluation.  HISTORY 07/24/15: [name] is a 50 year old female with a history of seizures, for years Dr. Bernardo Heater patient.  She reports that she had a seizure last week, Saturday.  She states that she's been taking Keppra and Lamictal consistently excpet for Friday night- the missed dose may have provoked the seizure. . Denies missing any medication. She states that after hitting her head she suffered a headache for 2 days however that has resolved. The patient does not operate a motor vehicle. At home she is able to complete all ADLs independent. She is no longer on narcotic medication. She reports that she will be having a second  knee replacement in 4 month. Mr. Dingee has done well with her last full knee replacement in April of this year, she had a hip replacement already 17 years ago, both by Dr.alusio. [name] reports that she has insomnia problems. There is also a report that her husband is very hard of hearing to the TV is very loud during the day and communication at times difficult. She has regular bedtimes between 9 and 10 PM, she will fall asleep at she will wake up around 2 AM and has great difficulties to resume sleep. She could use  melatonin 3 mg at night as a natural supplement to help her stay asleep longer, if this should be to no avail I would consider a low-dose of Klonopin. In a seizure patient I would not like to use Ambien as most benzodiazepines have at least an additional seizure controlling affect. She also has a history of parasomnia / sleepwalking.  She usually sleeps on her side, she sleeps on one pillow. The bedroom is cool, quiet and dark. She has a TV in the bedroom but cuts it off when she goes to bed. She is not sure what wakes her at 2 in the morning but it seems to be in early morning ritual and is very difficult for her to reinitiate sleep. Since her husband has noted her to snore but we have no reliable source reporting apnea, I will order an attended sleep study to test her.    HISTORY 02/01/2015 (MM): [name] is a 68 female with a history of seizures. She returns today for an evaluation. The patient had several seizures and was eventually taken to the emergency room. The patient did improve once at the emergency room. She did not have to be intubated. Her Keppra was increased to 1000 mg twice a day. The patient continues on Lamictal. Since then the patient has not had any additional seizures. The patient's sister-in-law brought to my attention in private that she feels that the patient is taking pain medicine inappropriately. She is wondering if this was the cause of some of  her symptoms/seizures when she went to the emergency room. She states that her physicians continue to give her pain medicine for her knee pain even after they have called and requested that she not be given this. Patient reports that she is able to complete ADLs independently. Patient does report ongoing depression. In the past she has been asked to follow-up with mental health however the patient has failed to do this. She is currently on Celexa.She denies any new neurological symptoms. She returns today for an  evaluation.   REVIEW OF SYSTEMS: Out of a complete 14 system review of symptoms, the patient complains only of the following symptoms, and all other reviewed systems are negative.  Blurred vision, constipation, insomnia, excessive thirst, joint pain, muscle cramps, rash, confusion, depression, suicidal thoughts, seizure, headache  ALLERGIES: Allergies  Allergen Reactions  . Acetaminophen Other (See Comments)    Has fatty deposits on liver  . Benadryl [Diphenhydramine Hcl] Other (See Comments)    hyperactivity and seizures  . Codeine Itching and Rash    seizures  . Dilantin [Phenytoin Sodium Extended] Other (See Comments)    Elevated LFT's  . Melatonin Other (See Comments)    seizures  . Ultram [Tramadol Hcl] Other (See Comments)    Seizures  . Vimpat [Lacosamide] Other (See Comments)    Severe dizziness  . Betadine [Povidone Iodine] Rash  . Latex Rash  . Penicillins Itching and Rash    Has patient had a PCN reaction causing immediate rash, facial/tongue/throat swelling, SOB or lightheadedness with hypotension: No Has patient had a PCN reaction causing severe rash involving mucus membranes or skin necrosis: No Has patient had a PCN reaction that required hospitalization No Has patient had a PCN reaction occurring within the last 10 years: No If all of the above answers are "NO", then may proceed with Cephalosporin use.  . Sulfa Antibiotics Rash  . Tape Rash    Paper tape please  . Vicodin [Hydrocodone-Acetaminophen] Itching    HOME MEDICATIONS: Outpatient Medications Prior to Visit  Medication Sig Dispense Refill  . citalopram (CELEXA) 40 MG tablet TAKE ONE TABLET BY MOUTH ONCE DAILY 30 tablet 2  . clonazePAM (KLONOPIN) 0.5 MG tablet TAke one each morning and as needed for seizure. Take one at bedtime. Repeat one hour later if needed for sleep. (Patient taking differently: Take 1 mg by mouth at bedtime. ) 90 tablet 5  . cyclobenzaprine (FLEXERIL) 10 MG tablet Take 10 mg by  mouth 3 (three) times daily as needed for spasms.    Marland Kitchen lamoTRIgine (LAMICTAL) 100 MG tablet TAKE ONE TABLET BY MOUTH TWICE DAILY 56 tablet 6  . levETIRAcetam (KEPPRA) 500 MG tablet Take 2 tablets PO in the morning and 3 tablets PO in the evening. 150 tablet 5  . Multiple Vitamins-Minerals (MULTIVITAMIN ADULT PO) Take 1 tablet by mouth daily.     . pramipexole (MIRAPEX) 0.5 MG tablet TAKE ONE TABLET BY MOUTH TWICE DAILY 56 tablet 0  . pregabalin (LYRICA) 100 MG capsule Take 1 capsule (100 mg total) by mouth 2 (two) times daily. 60 capsule 2  . vitamin B-12 (CYANOCOBALAMIN) 100 MCG tablet Take 100 mcg by mouth daily.    Marland Kitchen gabapentin (NEURONTIN) 600 MG tablet TAKE ONE TABLET BY MOUTH THREE TIMES DAILY (Patient not taking: Reported on 09/24/2015) 90 tablet 0  . QUEtiapine (SEROQUEL XR) 200 MG 24 hr tablet Take 1 tablet (200 mg total) by mouth at bedtime. (Patient not taking: Reported on 09/17/2015) 90 tablet 2  No facility-administered medications prior to visit.     PAST MEDICAL HISTORY: Past Medical History:  Diagnosis Date  . Arthritis    "knees" (04/22/2012)  . Bronchitis   . Chronic bronchitis ([place])    "yearly; when the weather changes" (04/22/2012)  . Chronic kidney disease   . Colon polyps    adenomatous and hyperplastic-  . Constipation   . Depression   . Eczema   . Epilepsy ([place])    "been having them right often here lately" (04/22/2012)  . Fatty liver   . [id number])    "1/wk" (04/22/2012)  . High cholesterol   . History of kidney stones   . Osteoarthritis    [name] 04/22/2012  . Other convulsions 05/21/12   non-epileptic spells  . Rectal bleed    in toilet- bright red  . Restless leg   . Seizures ([place])   . Vertigo     PAST SURGICAL HISTORY: Past Surgical History:  Procedure Laterality Date  . ABDOMINAL HYSTERECTOMY  2001  . BLADDER SUSPENSION    . BUNIONECTOMY Left 2000  . CESAREAN SECTION  1987; 1988  . COLONOSCOPY    . JOINT REPLACEMENT    . MASS EXCISION   10/22/2011   Procedure: EXCISION MASS;  Surgeon: [name], MD;  Location: [facility];  Service: General;  Laterality: Right;  excision right buttock mass  . TOTAL HIP ARTHROPLASTY Left 1993; 1995; 2000  . TOTAL KNEE ARTHROPLASTY Left 05/07/2015   Procedure: TOTAL LEFT KNEE ARTHROPLASTY;  Surgeon: [name], MD;  Location: [facility];  Service: Orthopedics;  Laterality: Left;    FAMILY HISTORY: Family History  Problem Relation Age of Onset  . Cancer Mother   . Cancer Brother   . Diabetes Brother   . Cancer Maternal Aunt   . Diabetes Maternal Aunt     SOCIAL HISTORY: Social History   Social History  . Marital status: Married    Spouse name: [name]  . Number of children: 2  . Years of education: 12 +   Occupational History  . unemployed Other   Social History Main Topics  . Smoking status: Never Smoker  . Smokeless tobacco: Never Used  . Alcohol use No  . Drug use: No  . Sexual activity: Not Currently    Birth control/ protection: Surgical   Other Topics Concern  . Not on file   Social History Narrative   Patient is married [name]) and lives at home with her husband.   Patient has two adult children.   Patient is disabled.   Patient has a high school education and some trade school.   Patient is right-handed.   Patient drinks four glasses of soda and tea daily.      PHYSICAL EXAM  Vitals:   09/25/15 1307  BP: (!) 90/58  Pulse: 92  Resp: 20  Weight: 174 lb (78.9 kg)  Height: 5' (1.524 m)   Body mass index is 33.98 kg/m.  Generalized: Well developed, in no acute distress   Neurological examination  Mentation: Alert oriented to time, place, history taking. Follows all commands, speech slurred and language fluent Cranial nerve II-XII: Pupils were equal round reactive to light. Extraocular movements were full, visual field were full on confrontational test. Facial sensation and strength were normal. Uvula tongue midline. Head turning and shoulder shrug   were normal and symmetric. Motor: The motor testing reveals 5 over 5 strength of all 4 extremities. Good symmetric motor tone is noted throughout.  Sensory:  Sensory testing is intact to soft touch on all 4 extremities. No evidence of extinction is noted.  Coordination: Cerebellar testing reveals good finger-nose-finger and heel-to-shin bilaterally.  Gait and station: Gait is normal. .  Reflexes: Deep tendon reflexes are symmetric and normal bilaterally.   DIAGNOSTIC DATA (LABS, IMAGING, TESTING) - I reviewed patient records, labs, notes, testing and imaging myself where available.  Lab Results  Component Value Date   WBC 7.4 09/25/2015   HGB 13.0 09/25/2015   HCT 39.0 09/25/2015   MCV 86.9 09/25/2015   PLT 321 09/25/2015      Component Value Date/Time   NA 139 09/25/2015 1135   NA 142 05/01/2015 1458   K 4.5 09/25/2015 1135   CL 103 09/25/2015 1135   CO2 29 09/25/2015 1135   GLUCOSE 90 09/25/2015 1135   BUN 15 09/25/2015 1135   BUN 11 05/01/2015 1458   CREATININE 0.72 09/25/2015 1135   CALCIUM 10.1 09/25/2015 1135   PROT 7.4 09/25/2015 1135   PROT 6.1 05/01/2015 1458   ALBUMIN 4.8 09/25/2015 1135   ALBUMIN 4.1 05/01/2015 1458   AST 65 (H) 09/25/2015 1135   ALT 68 (H) 09/25/2015 1135   ALKPHOS 70 09/25/2015 1135   BILITOT 0.9 09/25/2015 1135   BILITOT 0.4 05/01/2015 1458   GFRNONAA >60 09/25/2015 1135   GFRAA >60 09/25/2015 1135   Lab Results  Component Value Date   CHOL 217 (H) 04/09/2015   HDL 50 04/09/2015   LDLCALC 136 (H) 04/09/2015   TRIG 156 (H) 04/09/2015   CHOLHDL 4.3 04/09/2015   Lab Results  Component Value Date   HGBA1C 5.4 09/08/2013   Lab Results  Component Value Date   VITAMINB12 515 06/24/2011   Lab Results  Component Value Date   TSH 0.661 01/31/2015      ASSESSMENT AND PLAN 50 y.o. year old female  has a past medical history of Arthritis; Bronchitis; Chronic bronchitis ([place]); Chronic kidney disease; Colon polyps; Constipation;  Depression; Eczema; Epilepsy ([place]); Fatty liver; Headache(784.0); High cholesterol; History of kidney stones; Osteoarthritis; Other convulsions (05/21/12); Rectal bleed; Restless leg; Seizures ([place]); and Vertigo. here with:  1. Seizures  The patient did have a seizure earlier this month. I will increase Keppra to 1500 mg twice a day. She'll continue on Lamictal 100 mg twice a day. Advised patient that she has any additional seizure she should let us know. Follow-up in 6 months with Dr. Mechele Claude, MSN, NP-C 09/25/2015, 1:31 PM [facility] [address] [place], [place] [address] [phone]

## 2015-09-25 NOTE — Telephone Encounter (Signed)
phone call, spoke with patient, she said she is having knee replacement and she will call back when she is ready to schedule.

## 2015-09-25 NOTE — Progress Notes (Signed)
01/31/2015-Pre-operative clearance from Dr. Quinn Axe on chart. 06/13/2015-Pre-operative clearance from Dr. Brett Fairy on chart.

## 2015-09-25 NOTE — Patient Instructions (Signed)
[name]  09/25/2015   Your procedure is scheduled on: Monday 10/01/2015  Report to [facility] Main  Entrance take [place]  elevators to 3rd floor to  [place] at  [phone] AM.  Call this number if you have problems the morning of surgery [phone]   Remember: ONLY 1 PERSON MAY GO WITH YOU TO SHORT STAY TO GET  READY MORNING OF [place].   Do not eat food or drink liquids :After Midnight.     Take these medicines the morning of surgery with A SIP OF WATER: Lyrica, Lamotrigine (Lamictal), Gabapentin (Neurontin), Citalopram (Celexa), Levetiracetam (Keppra)                                  You may not have any metal on your body including hair pins and              piercings  Do not wear jewelry, make-up, lotions, powders or perfumes, deodorant             Do not wear nail polish.  Do not shave  48 hours prior to surgery.              Men may shave face and neck.   Do not bring valuables to the hospital. [place].  Contacts, dentures or bridgework may not be worn into surgery.  Leave suitcase in the car. After surgery it may be brought to your room.      Special Instructions: N/A              Please read over the following fact sheets you were given: _____________________________________________________________________             [facility] - Preparing for Surgery Before surgery, you can play an important role.  Because skin is not sterile, your skin needs to be as free of germs as possible.  You can reduce the number of germs on your skin by washing with CHG (chlorahexidine gluconate) soap before surgery.  CHG is an antiseptic cleaner which kills germs and bonds with the skin to continue killing germs even after washing. Please DO NOT use if you have an allergy to CHG or antibacterial soaps.  If your skin becomes reddened/irritated stop using the CHG and inform your nurse when you arrive  at Short Stay. Do not shave (including legs and underarms) for at least 48 hours prior to the first CHG shower.  You may shave your face/neck. Please follow these instructions carefully:  1.  Shower with CHG Soap the night before surgery and the  morning of Surgery.  2.  If you choose to wash your hair, wash your hair first as usual with your  normal  shampoo.  3.  After you shampoo, rinse your hair and body thoroughly to remove the  shampoo.                           4.  Use CHG as you would any other liquid soap.  You can apply chg directly  to the skin and wash  Gently with a scrungie or clean washcloth.  5.  Apply the CHG Soap to your body ONLY FROM THE NECK DOWN.   Do not use on face/ open                           Wound or open sores. Avoid contact with eyes, ears mouth and genitals (private parts).                       Wash face,  Genitals (private parts) with your normal soap.             6.  Wash thoroughly, paying special attention to the area where your surgery  will be performed.  7.  Thoroughly rinse your body with warm water from the neck down.  8.  DO NOT shower/wash with your normal soap after using and rinsing off  the CHG Soap.                9.  Pat yourself dry with a clean towel.            10.  Wear clean pajamas.            11.  Place clean sheets on your bed the night of your first shower and do not  sleep with pets. Day of Surgery : Do not apply any lotions/deodorants the morning of surgery.  Please wear clean clothes to the hospital/surgery center.  FAILURE TO FOLLOW THESE INSTRUCTIONS MAY RESULT IN THE CANCELLATION OF YOUR SURGERY PATIENT SIGNATURE_________________________________  NURSE SIGNATURE__________________________________  ________________________________________________________________________  Adam Phenix  An incentive spirometer is a tool that can help keep your lungs clear and active. This tool measures how well you  are filling your lungs with each breath. Taking long deep breaths may help reverse or decrease the chance of developing breathing (pulmonary) problems (especially infection) following:  A long period of time when you are unable to move or be active. BEFORE THE PROCEDURE   If the spirometer includes an indicator to show your best effort, your nurse or respiratory therapist will set it to a desired goal.  If possible, sit up straight or lean slightly forward. Try not to slouch.  Hold the incentive spirometer in an upright position. INSTRUCTIONS FOR USE  1. Sit on the edge of your bed if possible, or sit up as far as you can in bed or on a chair. 2. Hold the incentive spirometer in an upright position. 3. Breathe out normally. 4. Place the mouthpiece in your mouth and seal your lips tightly around it. 5. Breathe in slowly and as deeply as possible, raising the piston or the ball toward the top of the column. 6. Hold your breath for 3-5 seconds or for as long as possible. Allow the piston or ball to fall to the bottom of the column. 7. Remove the mouthpiece from your mouth and breathe out normally. 8. Rest for a few seconds and repeat Steps 1 through 7 at least 10 times every 1-2 hours when you are awake. Take your time and take a few normal breaths between deep breaths. 9. The spirometer may include an indicator to show your best effort. Use the indicator as a goal to work toward during each repetition. 10. After each set of 10 deep breaths, practice coughing to be sure your lungs are clear. If you have an incision (the cut made at the time of surgery), support  your incision when coughing by placing a pillow or rolled up towels firmly against it. Once you are able to get out of bed, walk around indoors and cough well. You may stop using the incentive spirometer when instructed by your caregiver.  RISKS AND COMPLICATIONS  Take your time so you do not get dizzy or light-headed.  If you are in  pain, you may need to take or ask for pain medication before doing incentive spirometry. It is harder to take a deep breath if you are having pain. AFTER USE  Rest and breathe slowly and easily.  It can be helpful to keep track of a log of your progress. Your caregiver can provide you with a simple table to help with this. If you are using the spirometer at home, follow these instructions: [place] IF:   You are having difficultly using the spirometer.  You have trouble using the spirometer as often as instructed.  Your pain medication is not giving enough relief while using the spirometer.  You develop fever of 100.5 F (38.1 C) or higher. SEEK IMMEDIATE MEDICAL CARE IF:   You cough up bloody sputum that had not been present before.  You develop fever of 102 F (38.9 C) or greater.  You develop worsening pain at or near the incision site. MAKE SURE YOU:   Understand these instructions.  Will watch your condition.  Will get help right away if you are not doing well or get worse. Document Released: 05/12/2006 Document Revised: 03/24/2011 Document Reviewed: 07/13/2006 ExitCare Patient Information 2014 ExitCare, [place].   ________________________________________________________________________  WHAT IS A BLOOD TRANSFUSION? Blood Transfusion Information  A transfusion is the replacement of blood or some of its parts. Blood is made up of multiple cells which provide different functions.  Red blood cells carry oxygen and are used for blood loss replacement.  White blood cells fight against infection.  Platelets control bleeding.  Plasma helps clot blood.  Other blood products are available for specialized needs, such as hemophilia or other clotting disorders. BEFORE THE TRANSFUSION  Who gives blood for transfusions?   Healthy volunteers who are fully evaluated to make sure their blood is safe. This is blood bank blood. Transfusion therapy is the safest it has  ever been in the practice of medicine. Before blood is taken from a donor, a complete history is taken to make sure that person has no history of diseases nor engages in risky social behavior (examples are intravenous drug use or sexual activity with multiple partners). The donor's travel history is screened to minimize risk of transmitting infections, such as malaria. The donated blood is tested for signs of infectious diseases, such as HIV and hepatitis. The blood is then tested to be sure it is compatible with you in order to minimize the chance of a transfusion reaction. If you or a relative donates blood, this is often done in anticipation of surgery and is not appropriate for emergency situations. It takes many days to process the donated blood. RISKS AND COMPLICATIONS Although transfusion therapy is very safe and saves many lives, the main dangers of transfusion include:   Getting an infectious disease.  Developing a transfusion reaction. This is an allergic reaction to something in the blood you were given. Every precaution is taken to prevent this. The decision to have a blood transfusion has been considered carefully by your caregiver before blood is given. Blood is not given unless the benefits outweigh the risks. AFTER THE TRANSFUSION  Right after receiving a blood transfusion, you will usually feel much better and more energetic. This is especially true if your red blood cells have gotten low (anemic). The transfusion raises the level of the red blood cells which carry oxygen, and this usually causes an energy increase.  The nurse administering the transfusion will monitor you carefully for complications. HOME CARE INSTRUCTIONS  No special instructions are needed after a transfusion. You may find your energy is better. Speak with your caregiver about any limitations on activity for underlying diseases you may have. SEEK MEDICAL CARE IF:   Your condition is not improving after your  transfusion.  You develop redness or irritation at the intravenous (IV) site. SEEK IMMEDIATE MEDICAL CARE IF:  Any of the following symptoms occur over the next 12 hours:  Shaking chills.  You have a temperature by mouth above 102 F (38.9 C), not controlled by medicine.  Chest, back, or muscle pain.  People around you feel you are not acting correctly or are confused.  Shortness of breath or difficulty breathing.  Dizziness and fainting.  You get a rash or develop hives.  You have a decrease in urine output.  Your urine turns a dark color or changes to pink, red, or brown. Any of the following symptoms occur over the next 10 days:  You have a temperature by mouth above 102 F (38.9 C), not controlled by medicine.  Shortness of breath.  Weakness after normal activity.  The white part of the eye turns yellow (jaundice).  You have a decrease in the amount of urine or are urinating less often.  Your urine turns a dark color or changes to pink, red, or brown. Document Released: 12/28/1999 Document Revised: 03/24/2011 Document Reviewed: 08/16/2007 [facility] Patient Information 2014 [place], [place].  _______________________________________________________________________

## 2015-09-25 NOTE — Patient Instructions (Signed)
Increase Keppra to 1500 mg (3 tablets) twice a day Continue lamictal If your symptoms worsen or you develop new symptoms please let us know.

## 2015-09-25 NOTE — Progress Notes (Signed)
I have read the note, and I agree with the clinical assessment and plan.  WILLIS,CHARLES KEITH   

## 2015-09-26 ENCOUNTER — Other Ambulatory Visit: Payer: Self-pay | Admitting: Family Medicine

## 2015-09-26 ENCOUNTER — Other Ambulatory Visit: Payer: Self-pay | Admitting: *Deleted

## 2015-09-26 MED ORDER — CLONAZEPAM 0.5 MG PO TABS: 0.5000 mg | ORAL_TABLET | Freq: Every day | ORAL | 5 refills | Status: DC

## 2015-09-27 LAB — MRSA CULTURE: Culture: NOT DETECTED

## 2015-09-28 NOTE — Telephone Encounter (Signed)
RX for Klonopin called into [facility] per Dr Livia Snellen

## 2015-09-30 ENCOUNTER — Ambulatory Visit: Payer: Self-pay | Admitting: Orthopedic Surgery

## 2015-09-30 NOTE — H&P (Signed)
[name] DOB: 07-06-1965 Married / Language: English / Race: White Female Date of Admission:  10/01/2015 CC:  Right Knee Pain History of Present Illness The patient is a 50 year old female who comes in for a preoperative History and Physical. The patient is scheduled for a right total knee arthroplasty to be performed by Dr. Dione Plover. Aluisio, MD at [facility] on 10/01/2015. The patient is a 50 year old female who is currently several months out from left total knee arthroplasty. The patient states that she is doing well at this time. She is very pleased with ther pain control at this time and describe their pain as mild. They are currently on no medication for their pain. The patient is currently doing home exercise program. The patient feels that they are progressing well at this time. Note for "Post TKA": Patient states that her left knee is doing great. She said that her right knee has been buckling on her. She is still having issues with the right knee and it has become the problematic knee. She is ready to proceed with surgery at this time. They have been treated conservatively in the past for the above stated problem and despite conservative measures, they continue to have progressive pain and severe functional limitations and dysfunction. They have failed non-operative management including home exercise, medications, and injections. It is felt that they would benefit from undergoing total joint replacement. Risks and benefits of the procedure have been discussed with the patient and they elect to proceed with surgery. There are no active contraindications to surgery such as ongoing infection or rapidly progressive neurological disease.  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 PENICILLIN [05/27/1998]: Swelling. Patient is able to take Keflex without difficulty. VICODIN [05/27/1998]: Itching. DARVOCET [02/06/2005]: Skin Rashes. Benadryl *ANTIHISTAMINES*  "Makes me hyper"  Family History  Bleeding disorder  brother Cancer  mother, grandmother mothers side, grandfather mothers side and grandmother fathers side Depression  father Diabetes Mellitus  brother, grandmother mothers side and grandfather mothers side Drug / Alcohol Addiction  father Heart disease in female family member before age 76  Kidney disease  father Severe allergy  father  Social History Alcohol use  never consumed alcohol Tobacco use  never smoker Children  2 Current work status  disabled Drug/Alcohol Rehab (Currently)  no Drug/Alcohol Rehab (Previously)  yes Exercise  Exercises rarely Illicit drug use  yes Living situation  live with spouse Marital status  married Number of flights of stairs before winded  1 Pain Contract  no Tobacco / smoke exposure  yes outdoors only [place] following the Right Total Knee to be performed on 10/01/2015.  Medication History  Flexeril 10mg  Active. Colace (prn) Active. Keppra (500MG  Tablet, 2 Oral Twice a day) Active. Gabapentin (600MG  Tablet, Oral) Active. CeleXA (20MG  Tablet, Oral) Active. ClonazePAM (0.5MG  Tablet, Oral) Active. LamoTRIgine (100MG  Tablet, Oral) Active. Pramipexole Dihydrochloride (0.5MG  Tablet, Oral) Active.   Past Surgical History  Arthroscopy of Knee  left Total Knee Replacement - Left [05/07/2015]: Arthroscopy of Shoulder  right Cesarean Delivery  2 times Colon Polyp Removal - Colonoscopy  Foot Surgery  bilateral Hysterectomy  partial (non-cancerous) Total Hip Replacement   left Tubal Ligation   Review of Systems General Not Present- Chills, Fatigue, Fever, Memory Loss, Night Sweats, Weight Gain and Weight Loss. Skin Not Present- Eczema, Hives, Itching, Lesions and Rash. HEENT Not Present- Dentures, Double Vision, Headache, Hearing Loss, Tinnitus and Visual Loss. Respiratory Not Present- Allergies, Chronic Cough, Coughing up blood, Shortness of breath at rest and Shortness of breath with exertion. Cardiovascular Not Present- Chest Pain, Difficulty Breathing Lying Down, Murmur, Palpitations, Racing/skipping heartbeats and Swelling. Gastrointestinal Not Present- Abdominal Pain, Bloody Stool, Constipation, Diarrhea, Difficulty Swallowing, Heartburn, Jaundice, Loss of appetitie, Nausea and Vomiting. Female Genitourinary Present- Urinary Retention. Not Present- Blood in Urine, Discharge, Flank Pain, Incontinence, Painful Urination, Urgency, Urinary frequency, Urinating at Night and Weak urinary stream. Musculoskeletal Present- Joint Pain. Not Present- Back Pain, Joint Swelling, Morning Stiffness, Muscle Pain, Muscle Weakness and Spasms. Neurological Not Present- Blackout spells, Difficulty with balance, Dizziness, Paralysis, Tremor and Weakness. Psychiatric Not Present- Insomnia.  Vitals  Weight: 168 lb Height: 60in Body Surface Area: 1.73 m Body Mass Index: 32.81 kg/m  Pulse: 68 (Regular)  BP: 112/68 (Sitting, Left Arm, Standard)  Physical Exam  General Mental Status -Alert, cooperative and good historian. General Appearance-pleasant, Not in acute distress. Orientation-Oriented X3. Build & Nutrition-Well nourished and Well developed.  Head and Neck Head-normocephalic, atraumatic . Neck Global Assessment - supple, no bruit auscultated on the right, no bruit auscultated on the left.  Eye Vision-Wears corrective lenses. Pupil - Bilateral-Regular and Round. Motion - Bilateral-EOMI.  ENMT Note: dentures   Chest and Lung  Exam Auscultation Breath sounds - clear at anterior chest wall and clear at posterior chest wall. Adventitious sounds - No Adventitious sounds.  Cardiovascular Auscultation Rhythm - Regular rate and rhythm. Heart Sounds - S1 WNL and S2 WNL. Murmurs & Other Heart Sounds - Auscultation of the heart reveals - No Murmurs.  Abdomen Palpation/Percussion Tenderness - Abdomen is non-tender to palpation. Rigidity (guarding) - Abdomen is soft. Auscultation Auscultation of the abdomen reveals - Bowel sounds normal.  Female Genitourinary Note: Not done, not pertinent to present illness  Assessment & Plan Primary osteoarthritis of right knee (M17.11) Status post total left knee replacement [id number])  Note:Surgical Plans: Right Total Knee Replacement  Disposition: Home  PCP: Dr. Burgess Estelle  IV TXA  Anesthesia Issues: None  Signed electronically by Ok Edwards, III PA-C

## 2015-10-01 ENCOUNTER — Encounter (HOSPITAL_COMMUNITY): Payer: Self-pay | Admitting: *Deleted

## 2015-10-01 ENCOUNTER — Inpatient Hospital Stay (HOSPITAL_COMMUNITY): Payer: PPO | Admitting: Certified Registered"

## 2015-10-01 ENCOUNTER — Encounter (HOSPITAL_COMMUNITY)
Admission: RE | Disposition: A | Discharge status: Home / Self Care | Payer: Self-pay | Source: Ambulatory Visit | Attending: Orthopedic Surgery

## 2015-10-01 ENCOUNTER — Inpatient Hospital Stay (HOSPITAL_COMMUNITY)
Admission: RE | Admit: 2015-10-01 | Discharge: 2015-10-03 | DRG: 470 | Disposition: A | Discharge status: Home / Self Care | Payer: PPO | Source: Ambulatory Visit | Attending: Orthopedic Surgery | Admitting: Orthopedic Surgery

## 2015-10-01 DIAGNOSIS — N189 Chronic kidney disease, unspecified: Secondary | ICD-10-CM | POA: Diagnosis present

## 2015-10-01 DIAGNOSIS — M1711 Unilateral primary osteoarthritis, right knee: Secondary | ICD-10-CM | POA: Diagnosis not present

## 2015-10-01 DIAGNOSIS — M170 Bilateral primary osteoarthritis of knee: Secondary | ICD-10-CM | POA: Diagnosis not present

## 2015-10-01 DIAGNOSIS — Z7409 Other reduced mobility: Secondary | ICD-10-CM

## 2015-10-01 DIAGNOSIS — G2581 Restless legs syndrome: Secondary | ICD-10-CM | POA: Diagnosis present

## 2015-10-01 DIAGNOSIS — Z841 Family history of disorders of kidney and ureter: Secondary | ICD-10-CM | POA: Diagnosis not present

## 2015-10-01 DIAGNOSIS — Z818 Family history of other mental and behavioral disorders: Secondary | ICD-10-CM

## 2015-10-01 DIAGNOSIS — Z90710 Acquired absence of both cervix and uterus: Secondary | ICD-10-CM

## 2015-10-01 DIAGNOSIS — M19071 Primary osteoarthritis, right ankle and foot: Secondary | ICD-10-CM | POA: Diagnosis present

## 2015-10-01 DIAGNOSIS — Z9851 Tubal ligation status: Secondary | ICD-10-CM | POA: Diagnosis not present

## 2015-10-01 DIAGNOSIS — Z8249 Family history of ischemic heart disease and other diseases of the circulatory system: Secondary | ICD-10-CM | POA: Diagnosis not present

## 2015-10-01 DIAGNOSIS — Z87442 Personal history of urinary calculi: Secondary | ICD-10-CM | POA: Diagnosis not present

## 2015-10-01 DIAGNOSIS — G40909 Epilepsy, unspecified, not intractable, without status epilepticus: Secondary | ICD-10-CM | POA: Diagnosis not present

## 2015-10-01 DIAGNOSIS — M1710 Unilateral primary osteoarthritis, unspecified knee: Secondary | ICD-10-CM

## 2015-10-01 DIAGNOSIS — Z96652 Presence of left artificial knee joint: Secondary | ICD-10-CM | POA: Diagnosis not present

## 2015-10-01 DIAGNOSIS — Z86010 Personal history of colonic polyps: Secondary | ICD-10-CM | POA: Diagnosis not present

## 2015-10-01 DIAGNOSIS — Z809 Family history of malignant neoplasm, unspecified: Secondary | ICD-10-CM | POA: Diagnosis not present

## 2015-10-01 DIAGNOSIS — M179 Osteoarthritis of knee, unspecified: Secondary | ICD-10-CM | POA: Diagnosis not present

## 2015-10-01 HISTORY — PX: TOTAL KNEE ARTHROPLASTY: SHX125

## 2015-10-01 LAB — TYPE AND SCREEN
ABO/RH(D): A POS
Antibody Screen: NEGATIVE

## 2015-10-01 SURGERY — ARTHROPLASTY, KNEE, TOTAL
Anesthesia: Monitor Anesthesia Care | Site: Knee | Laterality: Right

## 2015-10-01 MED ORDER — SODIUM CHLORIDE 0.9 % IJ SOLN
INTRAMUSCULAR | Status: DC | PRN
  Administered 2015-10-01: 30 mL

## 2015-10-01 MED ORDER — PROPOFOL 10 MG/ML IV BOLUS
INTRAVENOUS | Status: DC | PRN
Start: 2015-10-01 — End: 2015-10-01
  Administered 2015-10-01: 20 mg via INTRAVENOUS

## 2015-10-01 MED ORDER — PHENYLEPHRINE 40 MCG/ML (10ML) SYRINGE FOR IV PUSH (FOR BLOOD PRESSURE SUPPORT)
PREFILLED_SYRINGE | INTRAVENOUS | Status: AC
  Filled 2015-10-01: qty 10

## 2015-10-01 MED ORDER — BUPIVACAINE IN DEXTROSE 0.75-8.25 % IT SOLN
INTRATHECAL | Status: DC | PRN
Start: 2015-10-01 — End: 2015-10-01
  Administered 2015-10-01: 1.8 mL via INTRATHECAL

## 2015-10-01 MED ORDER — LACTATED RINGERS IV SOLN
INTRAVENOUS | Status: DC
  Administered 2015-10-01: 09:00:00 via INTRAVENOUS

## 2015-10-01 MED ORDER — PREGABALIN 100 MG PO CAPS
100.0000 mg | ORAL_CAPSULE | Freq: Two times a day (BID) | ORAL | Status: DC
  Administered 2015-10-01 – 2015-10-03 (×4): 100 mg via ORAL
  Filled 2015-10-01 (×4): qty 1

## 2015-10-01 MED ORDER — MENTHOL 3 MG MT LOZG: 1.0000 | LOZENGE | OROMUCOSAL | Status: DC | PRN

## 2015-10-01 MED ORDER — SODIUM CHLORIDE 0.9 % IR SOLN
Status: DC | PRN
  Administered 2015-10-01: 1000 mL

## 2015-10-01 MED ORDER — ONDANSETRON HCL 4 MG/2ML IJ SOLN: 4.0000 mg | Freq: Four times a day (QID) | INTRAMUSCULAR | Status: DC | PRN

## 2015-10-01 MED ORDER — SODIUM CHLORIDE 0.9 % IV SOLN
1000.0000 mg | Freq: Once | INTRAVENOUS | Status: AC
  Administered 2015-10-01: 1000 mg via INTRAVENOUS
  Filled 2015-10-01: qty 10

## 2015-10-01 MED ORDER — VANCOMYCIN HCL IN DEXTROSE 1-5 GM/200ML-% IV SOLN
1000.0000 mg | INTRAVENOUS | Status: AC
  Administered 2015-10-01: 1000 mg via INTRAVENOUS
  Filled 2015-10-01: qty 200

## 2015-10-01 MED ORDER — PRAMIPEXOLE DIHYDROCHLORIDE 0.25 MG PO TABS
0.5000 mg | ORAL_TABLET | Freq: Two times a day (BID) | ORAL | Status: DC
  Administered 2015-10-01 – 2015-10-03 (×4): 0.5 mg via ORAL
  Filled 2015-10-01 (×4): qty 2

## 2015-10-01 MED ORDER — SODIUM CHLORIDE 0.9 % IV SOLN
INTRAVENOUS | Status: DC
  Administered 2015-10-01: 16:00:00 via INTRAVENOUS

## 2015-10-01 MED ORDER — ONDANSETRON HCL 4 MG PO TABS: 4.0000 mg | ORAL_TABLET | Freq: Four times a day (QID) | ORAL | Status: DC | PRN

## 2015-10-01 MED ORDER — PROPOFOL 500 MG/50ML IV EMUL
INTRAVENOUS | Status: DC | PRN
  Administered 2015-10-01: 50 ug/kg/min via INTRAVENOUS

## 2015-10-01 MED ORDER — PHENYLEPHRINE 40 MCG/ML (10ML) SYRINGE FOR IV PUSH (FOR BLOOD PRESSURE SUPPORT)
PREFILLED_SYRINGE | INTRAVENOUS | Status: DC | PRN
  Administered 2015-10-01 (×2): 80 ug via INTRAVENOUS

## 2015-10-01 MED ORDER — FLEET ENEMA 7-19 GM/118ML RE ENEM: 1.0000 | ENEMA | Freq: Once | RECTAL | Status: DC | PRN

## 2015-10-01 MED ORDER — HYDROMORPHONE HCL 1 MG/ML IJ SOLN: 0.2500 mg | INTRAMUSCULAR | Status: DC | PRN

## 2015-10-01 MED ORDER — DIPHENHYDRAMINE HCL 50 MG/ML IJ SOLN
INTRAMUSCULAR | Status: DC | PRN
  Administered 2015-10-01: 25 mg via INTRAVENOUS

## 2015-10-01 MED ORDER — POLYETHYLENE GLYCOL 3350 17 G PO PACK: 17.0000 g | PACK | Freq: Every day | ORAL | Status: DC | PRN

## 2015-10-01 MED ORDER — TRANEXAMIC ACID 1000 MG/10ML IV SOLN
1000.0000 mg | Freq: Once | INTRAVENOUS | Status: AC
  Administered 2015-10-01: 1000 mg via INTRAVENOUS
  Filled 2015-10-01: qty 1100

## 2015-10-01 MED ORDER — LAMOTRIGINE 100 MG PO TABS
100.0000 mg | ORAL_TABLET | Freq: Two times a day (BID) | ORAL | Status: DC
  Administered 2015-10-01 – 2015-10-03 (×4): 100 mg via ORAL
  Filled 2015-10-01 (×5): qty 1

## 2015-10-01 MED ORDER — ONDANSETRON HCL 4 MG/2ML IJ SOLN
INTRAMUSCULAR | Status: DC | PRN
  Administered 2015-10-01: 4 mg via INTRAVENOUS

## 2015-10-01 MED ORDER — CLONAZEPAM 0.5 MG PO TABS
0.5000 mg | ORAL_TABLET | Freq: Every day | ORAL | Status: DC
  Administered 2015-10-01 – 2015-10-02 (×2): 0.5 mg via ORAL
  Filled 2015-10-01 (×2): qty 1

## 2015-10-01 MED ORDER — HYDROMORPHONE HCL 2 MG PO TABS
2.0000 mg | ORAL_TABLET | ORAL | Status: DC | PRN
  Administered 2015-10-01: 2 mg via ORAL
  Administered 2015-10-01 – 2015-10-02 (×3): 4 mg via ORAL
  Filled 2015-10-01: qty 2
  Filled 2015-10-01: qty 1
  Filled 2015-10-01 (×2): qty 2

## 2015-10-01 MED ORDER — BUPIVACAINE HCL 0.25 % IJ SOLN
INTRAMUSCULAR | Status: DC | PRN
  Administered 2015-10-01: 30 mL

## 2015-10-01 MED ORDER — DEXAMETHASONE SODIUM PHOSPHATE 10 MG/ML IJ SOLN
10.0000 mg | Freq: Once | INTRAMUSCULAR | Status: AC
  Administered 2015-10-02: 10 mg via INTRAVENOUS
  Filled 2015-10-01: qty 1

## 2015-10-01 MED ORDER — FENTANYL CITRATE (PF) 100 MCG/2ML IJ SOLN
INTRAMUSCULAR | Status: DC | PRN
  Administered 2015-10-01 (×2): 50 ug via INTRAVENOUS

## 2015-10-01 MED ORDER — OXYCODONE HCL 5 MG/5ML PO SOLN: 5.0000 mg | Freq: Once | ORAL | Status: DC | PRN

## 2015-10-01 MED ORDER — BUPIVACAINE LIPOSOME 1.3 % IJ SUSP
INTRAMUSCULAR | Status: DC | PRN
  Administered 2015-10-01: 20 mL

## 2015-10-01 MED ORDER — DEXAMETHASONE SODIUM PHOSPHATE 10 MG/ML IJ SOLN
INTRAMUSCULAR | Status: AC
  Filled 2015-10-01: qty 1

## 2015-10-01 MED ORDER — PHENOL 1.4 % MT LIQD: 1.0000 | OROMUCOSAL | Status: DC | PRN

## 2015-10-01 MED ORDER — CHLORHEXIDINE GLUCONATE 4 % EX LIQD: 60.0000 mL | Freq: Once | CUTANEOUS | Status: DC

## 2015-10-01 MED ORDER — ONDANSETRON HCL 4 MG/2ML IJ SOLN
INTRAMUSCULAR | Status: AC
  Filled 2015-10-01: qty 2

## 2015-10-01 MED ORDER — TRANEXAMIC ACID 1000 MG/10ML IV SOLN
1000.0000 mg | INTRAVENOUS | Status: AC
  Administered 2015-10-01: 1000 mg via INTRAVENOUS
  Filled 2015-10-01: qty 10

## 2015-10-01 MED ORDER — LEVETIRACETAM 750 MG PO TABS
1500.0000 mg | ORAL_TABLET | Freq: Two times a day (BID) | ORAL | Status: DC
  Administered 2015-10-01 – 2015-10-03 (×4): 1500 mg via ORAL
  Filled 2015-10-01 (×4): qty 2

## 2015-10-01 MED ORDER — HYDROMORPHONE HCL 1 MG/ML IJ SOLN
0.5000 mg | INTRAMUSCULAR | Status: DC | PRN
  Administered 2015-10-01 – 2015-10-02 (×5): 0.5 mg via INTRAVENOUS
  Filled 2015-10-01 (×6): qty 1

## 2015-10-01 MED ORDER — 0.9 % SODIUM CHLORIDE (POUR BTL) OPTIME
TOPICAL | Status: DC | PRN
  Administered 2015-10-01: 1000 mL

## 2015-10-01 MED ORDER — CITALOPRAM HYDROBROMIDE 20 MG PO TABS
40.0000 mg | ORAL_TABLET | Freq: Every day | ORAL | Status: DC
  Administered 2015-10-02 – 2015-10-03 (×2): 40 mg via ORAL
  Filled 2015-10-01 (×2): qty 2

## 2015-10-01 MED ORDER — DEXAMETHASONE SODIUM PHOSPHATE 10 MG/ML IJ SOLN
10.0000 mg | Freq: Once | INTRAMUSCULAR | Status: AC
  Administered 2015-10-01: 10 mg via INTRAVENOUS

## 2015-10-01 MED ORDER — METOCLOPRAMIDE HCL 5 MG PO TABS: 5.0000 mg | ORAL_TABLET | Freq: Three times a day (TID) | ORAL | Status: DC | PRN

## 2015-10-01 MED ORDER — LIDOCAINE 2% (20 MG/ML) 5 ML SYRINGE
INTRAMUSCULAR | Status: DC | PRN
  Administered 2015-10-01: 20 mg via INTRAVENOUS

## 2015-10-01 MED ORDER — BISACODYL 10 MG RE SUPP: 10.0000 mg | Freq: Every day | RECTAL | Status: DC | PRN

## 2015-10-01 MED ORDER — BUPIVACAINE HCL (PF) 0.25 % IJ SOLN
INTRAMUSCULAR | Status: AC
  Filled 2015-10-01: qty 30

## 2015-10-01 MED ORDER — DIPHENHYDRAMINE HCL 50 MG/ML IJ SOLN
INTRAMUSCULAR | Status: AC
  Filled 2015-10-01: qty 1

## 2015-10-01 MED ORDER — RIVAROXABAN 10 MG PO TABS
10.0000 mg | ORAL_TABLET | Freq: Every day | ORAL | Status: DC
  Administered 2015-10-02 – 2015-10-03 (×2): 10 mg via ORAL
  Filled 2015-10-01 (×2): qty 1

## 2015-10-01 MED ORDER — PROPOFOL 10 MG/ML IV BOLUS
INTRAVENOUS | Status: AC
  Filled 2015-10-01: qty 40

## 2015-10-01 MED ORDER — CYCLOBENZAPRINE HCL 10 MG PO TABS
10.0000 mg | ORAL_TABLET | Freq: Three times a day (TID) | ORAL | Status: DC | PRN
  Administered 2015-10-01 – 2015-10-03 (×5): 10 mg via ORAL
  Filled 2015-10-01 (×6): qty 1

## 2015-10-01 MED ORDER — LIDOCAINE 2% (20 MG/ML) 5 ML SYRINGE
INTRAMUSCULAR | Status: AC
  Filled 2015-10-01: qty 5

## 2015-10-01 MED ORDER — MIDAZOLAM HCL 5 MG/5ML IJ SOLN
INTRAMUSCULAR | Status: DC | PRN
  Administered 2015-10-01: 2 mg via INTRAVENOUS

## 2015-10-01 MED ORDER — STERILE WATER FOR IRRIGATION IR SOLN
Status: DC | PRN
  Administered 2015-10-01: 3000 mL

## 2015-10-01 MED ORDER — MIDAZOLAM HCL 2 MG/2ML IJ SOLN
INTRAMUSCULAR | Status: AC
  Filled 2015-10-01: qty 2

## 2015-10-01 MED ORDER — METOCLOPRAMIDE HCL 5 MG/ML IJ SOLN: 5.0000 mg | Freq: Three times a day (TID) | INTRAMUSCULAR | Status: DC | PRN

## 2015-10-01 MED ORDER — DOCUSATE SODIUM 100 MG PO CAPS
100.0000 mg | ORAL_CAPSULE | Freq: Two times a day (BID) | ORAL | Status: DC
  Administered 2015-10-01 – 2015-10-03 (×4): 100 mg via ORAL
  Filled 2015-10-01 (×4): qty 1

## 2015-10-01 MED ORDER — OXYCODONE HCL 5 MG PO TABS: 5.0000 mg | ORAL_TABLET | Freq: Once | ORAL | Status: DC | PRN

## 2015-10-01 MED ORDER — BUPIVACAINE LIPOSOME 1.3 % IJ SUSP
20.0000 mL | Freq: Once | INTRAMUSCULAR | Status: DC
  Filled 2015-10-01: qty 20

## 2015-10-01 MED ORDER — SODIUM CHLORIDE 0.9 % IJ SOLN
INTRAMUSCULAR | Status: AC
  Filled 2015-10-01: qty 50

## 2015-10-01 MED ORDER — VANCOMYCIN HCL IN DEXTROSE 1-5 GM/200ML-% IV SOLN
1000.0000 mg | Freq: Two times a day (BID) | INTRAVENOUS | Status: AC
  Administered 2015-10-02: 1000 mg via INTRAVENOUS
  Filled 2015-10-01: qty 200

## 2015-10-01 MED ORDER — FENTANYL CITRATE (PF) 100 MCG/2ML IJ SOLN
INTRAMUSCULAR | Status: AC
  Filled 2015-10-01: qty 2

## 2015-10-01 SURGICAL SUPPLY — 50 items
BAG DECANTER FOR FLEXI CONT (MISCELLANEOUS) ×2 IMPLANT
BAG ZIPLOCK 12X15 (MISCELLANEOUS) ×2 IMPLANT
BANDAGE ACE 6X5 VEL STRL LF (GAUZE/BANDAGES/DRESSINGS) ×2 IMPLANT
BLADE SAG 18X100X1.27 (BLADE) ×2 IMPLANT
BLADE SAW SGTL 11.0X1.19X90.0M (BLADE) ×2 IMPLANT
BOWL SMART MIX CTS (DISPOSABLE) ×2 IMPLANT
CAPT KNEE TOTAL 3 ATTUNE ×2 IMPLANT
CEMENT HV SMART SET (Cement) ×4 IMPLANT
CLOTH BEACON ORANGE TIMEOUT ST (SAFETY) ×2 IMPLANT
CUFF TOURN SGL QUICK 34 (TOURNIQUET CUFF) ×1
CUFF TRNQT CYL 34X4X40X1 (TOURNIQUET CUFF) ×1 IMPLANT
DECANTER SPIKE VIAL GLASS SM (MISCELLANEOUS) ×2 IMPLANT
DRAPE U-SHAPE 47X51 STRL (DRAPES) ×2 IMPLANT
DRSG ADAPTIC 3X8 NADH LF (GAUZE/BANDAGES/DRESSINGS) ×2 IMPLANT
DRSG PAD ABDOMINAL 8X10 ST (GAUZE/BANDAGES/DRESSINGS) ×2 IMPLANT
DURAPREP 26ML APPLICATOR (WOUND CARE) ×2 IMPLANT
ELECT REM PT RETURN 9FT ADLT (ELECTROSURGICAL) ×2
ELECTRODE REM PT RTRN 9FT ADLT (ELECTROSURGICAL) ×1 IMPLANT
EVACUATOR 1/8 PVC DRAIN (DRAIN) ×2 IMPLANT
GAUZE SPONGE 4X4 12PLY STRL (GAUZE/BANDAGES/DRESSINGS) ×2 IMPLANT
GLOVE BIO SURGEON STRL SZ7.5 (GLOVE) IMPLANT
GLOVE BIO SURGEON STRL SZ8 (GLOVE) IMPLANT
GLOVE BIOGEL PI IND STRL 6.5 (GLOVE) ×7 IMPLANT
GLOVE BIOGEL PI IND STRL 8 (GLOVE) ×1 IMPLANT
GLOVE BIOGEL PI INDICATOR 6.5 (GLOVE) ×7
GLOVE BIOGEL PI INDICATOR 8 (GLOVE) ×1
GLOVE SURG SS PI 6.5 STRL IVOR (GLOVE) IMPLANT
GOWN STRL REUS W/TWL LRG LVL3 (GOWN DISPOSABLE) ×8 IMPLANT
GOWN STRL REUS W/TWL XL LVL3 (GOWN DISPOSABLE) IMPLANT
HANDPIECE INTERPULSE COAX TIP (DISPOSABLE) ×1
IMMOBILIZER KNEE 20 (SOFTGOODS) ×2
IMMOBILIZER KNEE 20 THIGH 36 (SOFTGOODS) ×1 IMPLANT
MANIFOLD NEPTUNE II (INSTRUMENTS) ×2 IMPLANT
NS IRRIG 1000ML POUR BTL (IV SOLUTION) ×2 IMPLANT
PACK TOTAL KNEE CUSTOM (KITS) ×2 IMPLANT
PAD ABD 8X10 STRL (GAUZE/BANDAGES/DRESSINGS) ×2 IMPLANT
PADDING CAST COTTON 6X4 STRL (CAST SUPPLIES) ×2 IMPLANT
POSITIONER SURGICAL ARM (MISCELLANEOUS) ×2 IMPLANT
SET HNDPC FAN SPRY TIP SCT (DISPOSABLE) ×1 IMPLANT
STRIP CLOSURE SKIN 1/2X4 (GAUZE/BANDAGES/DRESSINGS) ×2 IMPLANT
SUT MNCRL AB 4-0 PS2 18 (SUTURE) ×2 IMPLANT
SUT VIC AB 2-0 CT1 27 (SUTURE) ×3
SUT VIC AB 2-0 CT1 TAPERPNT 27 (SUTURE) ×3 IMPLANT
SUT VLOC 180 0 24IN GS25 (SUTURE) ×2 IMPLANT
SYR 50ML LL SCALE MARK (SYRINGE) IMPLANT
TRAY FOLEY BAG SILVER LF 16FR (CATHETERS) ×2 IMPLANT
TRAY FOLEY W/METER SILVER 16FR (SET/KITS/TRAYS/PACK) IMPLANT
WATER STERILE IRR 1500ML POUR (IV SOLUTION) ×4 IMPLANT
WRAP KNEE MAXI GEL POST OP (GAUZE/BANDAGES/DRESSINGS) ×2 IMPLANT
YANKAUER SUCT BULB TIP 10FT TU (MISCELLANEOUS) ×2 IMPLANT

## 2015-10-01 NOTE — Progress Notes (Signed)
PT Cancellation Note  Patient Details Name: [name] MRN: [id number] DOB: Jun 15, 1965   Cancelled Treatment:    Reason Eval/Treat Not Completed: Patient not medically ready (still some numbness)   [name] 10/01/2015, 6:27 PM

## 2015-10-01 NOTE — Anesthesia Preprocedure Evaluation (Signed)
Anesthesia Evaluation  Patient identified by MRN, date of birth, ID band Patient awake    Reviewed: Allergy & Precautions, H&P , NPO status , Patient's Chart, lab work & pertinent test results  Airway Mallampati: II   Neck ROM: full    Dental   Pulmonary    breath sounds clear to auscultation       Cardiovascular negative cardio ROS   Rhythm:regular Rate:Normal     Neuro/Psych  Headaches, Seizures -,  PSYCHIATRIC DISORDERS Depression    GI/Hepatic   Endo/Other    Renal/GU      Musculoskeletal  (+) Arthritis ,   Abdominal   Peds  Hematology   Anesthesia Other Findings   Reproductive/Obstetrics                             Anesthesia Physical Anesthesia Plan  ASA: II  Anesthesia Plan: MAC and Spinal   Post-op Pain Management:    Induction: Intravenous  Airway Management Planned: Simple Face Mask  Additional Equipment:   Intra-op Plan:   Post-operative Plan:   Informed Consent: I have reviewed the patients History and Physical, chart, labs and discussed the procedure including the risks, benefits and alternatives for the proposed anesthesia with the patient or authorized representative who has indicated his/her understanding and acceptance.     Plan Discussed with: CRNA, Anesthesiologist and Surgeon  Anesthesia Plan Comments:         Anesthesia Quick Evaluation

## 2015-10-01 NOTE — H&P (View-Only) (Signed)
[name] DOB: 1965-08-27 Married / Language: English / Race: White Female Date of Admission:  10/01/2015 CC:  Right Knee Pain History of Present Illness The patient is a 50 year old female who comes in for a preoperative History and Physical. The patient is scheduled for a right total knee arthroplasty to be performed by Dr. Dione Plover. Aluisio, MD at [facility] on 10/01/2015. The patient is a 50 year old female who is currently several months out from left total knee arthroplasty. The patient states that she is doing well at this time. She is very pleased with ther pain control at this time and describe their pain as mild. They are currently on no medication for their pain. The patient is currently doing home exercise program. The patient feels that they are progressing well at this time. Note for "Post TKA": Patient states that her left knee is doing great. She said that her right knee has been buckling on her. She is still having issues with the right knee and it has become the problematic knee. She is ready to proceed with surgery at this time. They have been treated conservatively in the past for the above stated problem and despite conservative measures, they continue to have progressive pain and severe functional limitations and dysfunction. They have failed non-operative management including home exercise, medications, and injections. It is felt that they would benefit from undergoing total joint replacement. Risks and benefits of the procedure have been discussed with the patient and they elect to proceed with surgery. There are no active contraindications to surgery such as ongoing infection or rapidly progressive neurological disease.  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 PENICILLIN [05/27/1998]: Swelling. Patient is able to take Keflex without difficulty. VICODIN [05/27/1998]: Itching. DARVOCET [02/06/2005]: Skin Rashes. Benadryl *ANTIHISTAMINES*  "Makes me hyper"  Family History  Bleeding disorder  brother Cancer  mother, grandmother mothers side, grandfather mothers side and grandmother fathers side Depression  father Diabetes Mellitus  brother, grandmother mothers side and grandfather mothers side Drug / Alcohol Addiction  father Heart disease in female family member before age 3  Kidney disease  father Severe allergy  father  Social History Alcohol use  never consumed alcohol Tobacco use  never smoker Children  2 Current work status  disabled Drug/Alcohol Rehab (Currently)  no Drug/Alcohol Rehab (Previously)  yes Exercise  Exercises rarely Illicit drug use  yes Living situation  live with spouse Marital status  married Number of flights of stairs before winded  1 Pain Contract  no Tobacco / smoke exposure  yes outdoors only [place] following the Right Total Knee to be performed on 10/01/2015.  Medication History  Flexeril 10mg  Active. Colace (prn) Active. Keppra (500MG  Tablet, 2 Oral Twice a day) Active. Gabapentin (600MG  Tablet, Oral) Active. CeleXA (20MG  Tablet, Oral) Active. ClonazePAM (0.5MG  Tablet, Oral) Active. LamoTRIgine (100MG  Tablet, Oral) Active. Pramipexole Dihydrochloride (0.5MG  Tablet, Oral) Active.   Past Surgical History  Arthroscopy of Knee  left Total Knee Replacement - Left [05/07/2015]: Arthroscopy of Shoulder  right Cesarean Delivery  2 times Colon Polyp Removal - Colonoscopy  Foot Surgery  bilateral Hysterectomy  partial (non-cancerous) Total Hip Replacement   left Tubal Ligation   Review of Systems General Not Present- Chills, Fatigue, Fever, Memory Loss, Night Sweats, Weight Gain and Weight Loss. Skin Not Present- Eczema, Hives, Itching, Lesions and Rash. HEENT Not Present- Dentures, Double Vision, Headache, Hearing Loss, Tinnitus and Visual Loss. Respiratory Not Present- Allergies, Chronic Cough, Coughing up blood, Shortness of breath at rest and Shortness of breath with exertion. Cardiovascular Not Present- Chest Pain, Difficulty Breathing Lying Down, Murmur, Palpitations, Racing/skipping heartbeats and Swelling. Gastrointestinal Not Present- Abdominal Pain, Bloody Stool, Constipation, Diarrhea, Difficulty Swallowing, Heartburn, Jaundice, Loss of appetitie, Nausea and Vomiting. Female Genitourinary Present- Urinary Retention. Not Present- Blood in Urine, Discharge, Flank Pain, Incontinence, Painful Urination, Urgency, Urinary frequency, Urinating at Night and Weak urinary stream. Musculoskeletal Present- Joint Pain. Not Present- Back Pain, Joint Swelling, Morning Stiffness, Muscle Pain, Muscle Weakness and Spasms. Neurological Not Present- Blackout spells, Difficulty with balance, Dizziness, Paralysis, Tremor and Weakness. Psychiatric Not Present- Insomnia.  Vitals  Weight: 168 lb Height: 60in Body Surface Area: 1.73 m Body Mass Index: 32.81 kg/m  Pulse: 68 (Regular)  BP: 112/68 (Sitting, Left Arm, Standard)  Physical Exam  General Mental Status -Alert, cooperative and good historian. General Appearance-pleasant, Not in acute distress. Orientation-Oriented X3. Build & Nutrition-Well nourished and Well developed.  Head and Neck Head-normocephalic, atraumatic . Neck Global Assessment - supple, no bruit auscultated on the right, no bruit auscultated on the left.  Eye Vision-Wears corrective lenses. Pupil - Bilateral-Regular and Round. Motion - Bilateral-EOMI.  ENMT Note: dentures   Chest and Lung  Exam Auscultation Breath sounds - clear at anterior chest wall and clear at posterior chest wall. Adventitious sounds - No Adventitious sounds.  Cardiovascular Auscultation Rhythm - Regular rate and rhythm. Heart Sounds - S1 WNL and S2 WNL. Murmurs & Other Heart Sounds - Auscultation of the heart reveals - No Murmurs.  Abdomen Palpation/Percussion Tenderness - Abdomen is non-tender to palpation. Rigidity (guarding) - Abdomen is soft. Auscultation Auscultation of the abdomen reveals - Bowel sounds normal.  Female Genitourinary Note: Not done, not pertinent to present illness  Assessment & Plan Primary osteoarthritis of right knee (M17.11) Status post total left knee replacement [id number])  Note:Surgical Plans: Right Total Knee Replacement  Disposition: Home  PCP: Dr. Burgess Estelle  IV TXA  Anesthesia Issues: None  Signed electronically by Ok Edwards, III PA-C

## 2015-10-01 NOTE — Anesthesia Postprocedure Evaluation (Signed)
Anesthesia Post Note  Patient: [name]  Procedure(s) Performed: Procedure(s) (LRB): RIGHT TOTAL KNEE ARTHROPLASTY (Right)  Patient location during evaluation: PACU Anesthesia Type: Spinal Level of consciousness: oriented and awake and alert Pain management: pain level controlled Vital Signs Assessment: post-procedure vital signs reviewed and stable Respiratory status: spontaneous breathing, respiratory function stable and patient connected to nasal cannula oxygen Cardiovascular status: blood pressure returned to baseline and stable Postop Assessment: no headache and no backache Anesthetic complications: no    Last Vitals:  Vitals:   10/01/15 1403 10/01/15 1458  BP: (!) 89/59 108/64  Pulse: 72 80  Resp: 16 16  Temp: 36.7 C 36.7 C    Last Pain:  Vitals:   10/01/15 1458  TempSrc: Oral  PainSc:                  [place]

## 2015-10-01 NOTE — Anesthesia Procedure Notes (Signed)
Spinal  Start time: 10/01/2015 11:01 AM End time: 10/01/2015 11:05 AM Staffing Anesthesiologist: Marcie Bal, ADAM Resident/CRNA: Lajuana Carry E Performed: resident/CRNA  Preanesthetic Checklist Completed: patient identified, site marked, surgical consent, pre-op evaluation, timeout performed, IV checked, risks and benefits discussed and monitors and equipment checked Spinal Block Patient position: sitting Prep: ChloraPrep Patient monitoring: heart rate, continuous pulse ox and blood pressure Approach: midline Location: L3-4 Injection technique: single-shot Needle Needle type: Pencan  Needle gauge: 24 G Needle length: 9 cm Assessment Sensory level: T6 Additional Notes Kit expiration checked, sitting position, first attempt clear CSF, neg heme. Tol well return to supine.

## 2015-10-01 NOTE — Op Note (Signed)
OPERATIVE REPORT-TOTAL KNEE ARTHROPLASTY   Pre-operative diagnosis- Osteoarthritis  Right knee(s)  Post-operative diagnosis- Osteoarthritis Right knee(s)  Procedure-  Right  Total Knee Arthroplasty  Surgeon- Dione Plover. Markus Casten, MD  Assistant- Ardeen Jourdain, PA-C   Anesthesia-  Spinal  EBL-* No blood loss amount entered *   Drains Hemovac  Tourniquet time-  Total Tourniquet Time Documented: Thigh (Right) - 36 minutes Total: Thigh (Right) - 36 minutes     Complications- None  Condition-PACU - hemodynamically stable.   Brief Clinical Note  ALAETRA [name] is a 50 y.o. year old female with end stage OA of her right knee with progressively worsening pain and dysfunction. She has constant pain, with activity and at rest and significant functional deficits with difficulties even with ADLs. She has had extensive non-op management including analgesics, injections of cortisone and viscosupplements, and home exercise program, but remains in significant pain with significant dysfunction.Radiographs show bone on bone arthritis medial and patellofemoral. She presents now for right Total Knee Arthroplasty.    Procedure in detail---   The patient is brought into the operating room and positioned supine on the operating table. After successful administration of  Spinal,   a tourniquet is placed high on the  Right thigh(s) and the lower extremity is prepped and draped in the usual sterile fashion. Time out is performed by the operating team and then the  Right lower extremity is wrapped in Esmarch, knee flexed and the tourniquet inflated to 300 mmHg.       A midline incision is made with a ten blade through the subcutaneous tissue to the level of the extensor mechanism. A fresh blade is used to make a medial parapatellar arthrotomy. Soft tissue over the proximal medial tibia is subperiosteally elevated to the joint line with a knife and into the semimembranosus bursa with a Cobb elevator. Soft  tissue over the proximal lateral tibia is elevated with attention being paid to avoiding the patellar tendon on the tibial tubercle. The patella is everted, knee flexed 90 degrees and the ACL and PCL are removed. Findings are bone on bone medial and patellofemoral with large global osteophytes.        The drill is used to create a starting hole in the distal femur and the canal is thoroughly irrigated with sterile saline to remove the fatty contents. The 5 degree Right  valgus alignment guide is placed into the femoral canal and the distal femoral cutting block is pinned to remove 9 mm off the distal femur. Resection is made with an oscillating saw.      The tibia is subluxed forward and the menisci are removed. The extramedullary alignment guide is placed referencing proximally at the medial aspect of the tibial tubercle and distally along the second metatarsal axis and tibial crest. The block is pinned to remove 29mm off the more deficient medial  side. Resection is made with an oscillating saw. Size 3is the most appropriate size for the tibia and the proximal tibia is prepared with the modular drill and keel punch for that size.      The femoral sizing guide is placed and size 4 is most appropriate. Rotation is marked off the epicondylar axis and confirmed by creating a rectangular flexion gap at 90 degrees. The size 4 cutting block is pinned in this rotation and the anterior, posterior and chamfer cuts are made with the oscillating saw. The intercondylar block is then placed and that cut is made.  Trial size 3 tibial component, trial size 4 narrow posterior stabilized femur and a 6  mm posterior stabilized rotating platform insert trial is placed. Full extension is achieved with excellent varus/valgus and anterior/posterior balance throughout full range of motion. The patella is everted and thickness measured to be 22  mm. Free hand resection is taken to 12 mm, a 35 template is placed, lug holes are  drilled, trial patella is placed, and it tracks normally. Osteophytes are removed off the posterior femur with the trial in place. All trials are removed and the cut bone surfaces prepared with pulsatile lavage. Cement is mixed and once ready for implantation, the size 3 tibial implant, size  4 narrow posterior stabilized femoral component, and the size 35 patella are cemented in place and the patella is held with the clamp. The trial insert is placed and the knee held in full extension. The Exparel (20 ml mixed with 30 ml saline) and .25% Bupivicaine, are injected into the extensor mechanism, posterior capsule, medial and lateral gutters and subcutaneous tissues.  All extruded cement is removed and once the cement is hard the permanent 6 mm posterior stabilized rotating platform insert is placed into the tibial tray.      The wound is copiously irrigated with saline solution and the extensor mechanism closed over a hemovac drain with #1 V-loc suture. The tourniquet is released for a total tourniquet time of 36  minutes. Flexion against gravity is 140 degrees and the patella tracks normally. Subcutaneous tissue is closed with 2.0 vicryl and subcuticular with running 4.0 Monocryl. The incision is cleaned and dried and steri-strips and a bulky sterile dressing are applied. The limb is placed into a knee immobilizer and the patient is awakened and transported to recovery in stable condition.      Please note that a surgical assistant was a medical necessity for this procedure in order to perform it in a safe and expeditious manner. Surgical assistant was necessary to retract the ligaments and vital neurovascular structures to prevent injury to them and also necessary for proper positioning of the limb to allow for anatomic placement of the prosthesis.   Dione Plover Donni Oglesby, MD    10/01/2015, 12:09 PM

## 2015-10-01 NOTE — Transfer of Care (Signed)
Immediate Anesthesia Transfer of Care Note  Patient: [name]  Procedure(s) Performed: Procedure(s): RIGHT TOTAL KNEE ARTHROPLASTY (Right)  Patient Location: PACU  Anesthesia Type:Spinal  Level of Consciousness:  sedated, patient cooperative and responds to stimulation  Airway & Oxygen Therapy:Patient Spontanous Breathing and Patient connected to face mask oxgen  Post-op Assessment:  Report given to PACU RN and Post -op Vital signs reviewed and stable  Post vital signs:  Reviewed and stable  Last Vitals:  Vitals:   10/01/15 0919  BP: 113/78  Pulse: 97  Resp: 16  Temp: 123XX123 C    Complications: No apparent anesthesia complications

## 2015-10-01 NOTE — Interval H&P Note (Signed)
History and Physical Interval Note:  10/01/2015 9:18 AM  [name]  has presented today for surgery, with the diagnosis of right knee osteoarthritis  The various methods of treatment have been discussed with the patient and family. After consideration of risks, benefits and other options for treatment, the patient has consented to  Procedure(s): RIGHT TOTAL KNEE ARTHROPLASTY (Right) as a surgical intervention .  The patient's history has been reviewed, patient examined, no change in status, stable for surgery.  I have reviewed the patient's chart and labs.  Questions were answered to the patient's satisfaction.     Gearlean Alf

## 2015-10-02 LAB — BASIC METABOLIC PANEL
Anion gap: 7 (ref 5–15)
BUN: 10 mg/dL (ref 6–20)
CO2: 27 mmol/L (ref 22–32)
Calcium: 8.8 mg/dL — ABNORMAL LOW (ref 8.9–10.3)
Chloride: 104 mmol/L (ref 101–111)
Creatinine, Ser: 0.57 mg/dL (ref 0.44–1.00)
GFR calc Af Amer: >60 mL/min (ref >60)
GFR calc non Af Amer: >60 mL/min (ref >60)
Glucose, Bld: 108 mg/dL — ABNORMAL HIGH (ref 65–99)
Potassium: 3.8 mmol/L (ref 3.5–5.1)
Sodium: 138 mmol/L (ref 135–145)

## 2015-10-02 LAB — CBC
HCT: 35.1 % — ABNORMAL LOW (ref 36.0–46.0)
Hemoglobin: 11.8 g/dL — ABNORMAL LOW (ref 12.0–15.0)
MCH: 29.7 pg (ref 26.0–34.0)
MCHC: 33.6 g/dL (ref 30.0–36.0)
MCV: 88.4 fL (ref 78.0–100.0)
Platelets: 269 10*3/uL (ref 150–400)
RBC: 3.97 MIL/uL (ref 3.87–5.11)
RDW: 14.1 % (ref 11.5–15.5)
WBC: 15.1 10*3/uL — ABNORMAL HIGH (ref 4.0–10.5)

## 2015-10-02 MED ORDER — HYDROMORPHONE HCL 1 MG/ML IJ SOLN
0.5000 mg | INTRAMUSCULAR | Status: DC | PRN
  Administered 2015-10-02 – 2015-10-03 (×3): 0.5 mg via INTRAVENOUS
  Filled 2015-10-02 (×3): qty 1

## 2015-10-02 MED ORDER — HYDROXYZINE HCL 25 MG PO TABS
25.0000 mg | ORAL_TABLET | Freq: Four times a day (QID) | ORAL | Status: DC | PRN
  Administered 2015-10-02 – 2015-10-03 (×3): 25 mg via ORAL
  Filled 2015-10-02 (×3): qty 1

## 2015-10-02 MED ORDER — HYDROMORPHONE HCL 2 MG PO TABS
2.0000 mg | ORAL_TABLET | ORAL | Status: DC | PRN
  Administered 2015-10-02 – 2015-10-03 (×9): 4 mg via ORAL
  Filled 2015-10-02 (×9): qty 2

## 2015-10-02 NOTE — Progress Notes (Signed)
Occupational Therapy Evaluation Patient Details Name: [name] MRN: [id number] DOB: 01/01/1966 Today's Date: 10/02/2015    History of Present Illness s/p R TKA, h/o L TKA 04/2015   Clinical Impression   All OT education completed and pt questions answered. No further OT needs at this time. Will sign off.    Follow Up Recommendations  No OT follow up;Supervision/Assistance - 24 hour    Equipment Recommendations  None recommended by OT    Recommendations for Other Services       Precautions / Restrictions Precautions Precautions: Knee;Fall Required Braces or Orthoses: Knee Immobilizer - Right Knee Immobilizer - Right: Discontinue once straight leg raise with < 10 degree lag Restrictions Weight Bearing Restrictions: No Other Position/Activity Restrictions: WBAT      Mobility Bed Mobility Overal bed mobility: Needs Assistance Bed Mobility: Sit to Supine       Sit to supine: Min assist   General bed mobility comments: for RLE  Transfers Overall transfer level: Needs assistance Equipment used: Rolling walker (2 wheeled) Transfers: Sit to/from Stand Sit to Stand: Min guard         General transfer comment: cues UE/LE placement    Balance                                            ADL Overall ADL's : Needs assistance/impaired Eating/Feeding: Independent;Sitting   Grooming: Wash/dry hands;Min guard;Standing           Upper Body Dressing : Set up;Sitting   Lower Body Dressing: Minimal assistance;Moderate assistance;Sit to/from stand   Toilet Transfer: Min guard;Ambulation;BSC;RW   Toileting- Water quality scientist and Hygiene: Min guard;Sit to/from stand       Functional mobility during ADLs: Min guard;Rolling walker General ADL Comments: Patient reports daughter in law can assist with ADLs as needed. Daughter in law had to assist with ADLs after prior knee replacement.     Vision     Perception     Praxis       Pertinent Vitals/Pain Pain Assessment: 0-10 Pain Score: 4  Pain Location: R knee Pain Descriptors / Indicators: Aching;Sore Pain Intervention(s): Monitored during session;Premedicated before session;Repositioned;Ice applied     Hand Dominance Right   Extremity/Trunk Assessment Upper Extremity Assessment Upper Extremity Assessment: Overall WFL for tasks assessed   Lower Extremity Assessment Lower Extremity Assessment: Defer to PT evaluation       Communication Communication Communication: No difficulties   Cognition Arousal/Alertness: Awake/alert Behavior During Therapy: WFL for tasks assessed/performed Overall Cognitive Status: Within Functional Limits for tasks assessed                     General Comments       Exercises       Shoulder Instructions      Home Living Family/patient expects to be discharged to:: Private residence Living Arrangements: Spouse/significant other Available Help at Discharge: Family;Available 24 hours/day (daughter in law to assist) Type of Home: House Home Access: Stairs to enter [employer] of Steps: 3 Entrance Stairs-Rails: Right;Left;Can reach both Home Layout: One level     Bathroom Shower/Tub: Tub/shower unit;Curtain Shower/tub characteristics: Architectural technologist: Standard Bathroom Accessibility: Yes How Accessible: Accessible via walker Home Equipment: [place] - 2 wheels;Cane - quad;Cane - single point;Bedside commode;Tub bench          Prior Functioning/Environment Level of Independence: Independent with  assistive device(s)        Comments: with cane        OT Problem List: Decreased strength;Decreased range of motion;Pain   OT Treatment/Interventions:      OT Goals(Current goals can be found in the care plan section) Acute Rehab OT Goals Patient Stated Goal: regain IND OT Goal Formulation: All assessment and education complete, DC therapy  OT Frequency:     Barriers to D/C:             Co-evaluation              End of Session Equipment Utilized During Treatment: Rolling walker;Right knee immobilizer Nurse Communication: Mobility status  Activity Tolerance: Patient tolerated treatment well Patient left: in bed;with call bell/phone within reach   Time: [id number] OT Time Calculation (min): 17 min Charges:  OT General Charges $OT Visit: 1 Procedure OT Evaluation $OT Eval Low Complexity: 1 Procedure G-Codes:    Lemario Chaikin A 10-11-15, 10:44 AM

## 2015-10-02 NOTE — Progress Notes (Signed)
Physical Therapy Treatment Patient Details Name: [name] MRN: [id number] DOB: [birth year] Today's Date: 10/02/2015    History of Present Illness s/p R TKA, h/o L TKA 04/2015    PT Comments    The patient is groggy but able to ambulate  X 100'/ continue PT. Plans DC tomorrow.  Follow Up Recommendations  Supervision/Assistance - 24 hour;Home health PT     Equipment Recommendations  None recommended by PT    Recommendations for Other Services       Precautions / Restrictions Precautions Precautions: Knee Precaution Comments: did not use KI    Mobility  Bed Mobility   Bed Mobility: Supine to Sit;Sit to Supine     Supine to sit: Supervision Sit to supine: Supervision   General bed mobility comments: manages  rt leg  Transfers   Equipment used: Rolling walker (2 wheeled) Transfers: Sit to/from Stand Sit to Stand: Supervision         General transfer comment: cues UE/LE placement  Ambulation/Gait Ambulation/Gait assistance: Min guard Ambulation Distance (Feet): 100 Feet Assistive device: Rolling walker (2 wheeled) Gait Pattern/deviations: Step-to pattern     General Gait Details: cues to place the foot flat, is tending to be on the toes.   Stairs            Wheelchair Mobility    Modified Rankin (Stroke Patients Only)       Balance                                    Cognition Arousal/Alertness: Lethargic                          Exercises      General Comments        Pertinent Vitals/Pain Pain Score: 2  Pain Location: rt knee Pain Descriptors / Indicators: Discomfort Pain Intervention(s): Premedicated before session;Ice applied;Repositioned    Home Living                      Prior Function            PT Goals (current goals can now be found in the care plan section) Progress towards PT goals: Progressing toward goals    Frequency    7X/week      PT Plan      Co-evaluation              End of Session   Activity Tolerance: Patient tolerated treatment well Patient left: in bed;with call bell/phone within reach;with bed alarm set     Time: 1400-1425 PT Time Calculation (min) (ACUTE ONLY): 25 min  Charges:  $Gait Training: 8-22 mins $Self Care/Home Management: 8-22                    G Codes:      [name] 10/02/2015, 3:18 PM Tresa Endo PT [phone]

## 2015-10-02 NOTE — Progress Notes (Signed)
   Subjective: 1 Day Post-Op Procedure(s) (LRB): RIGHT TOTAL KNEE ARTHROPLASTY (Right) Patient reports pain as moderate and severe.   Patient seen in rounds for Dr. Wynelle Link. Patient is having problems with pain in the knee, requiring pain medications We will start therapy today and work on pain management. Plan is to go Home after hospital stay.  Objective: Vital signs in last 24 hours: Temp:  [97.8 F (36.6 C)-98.9 F (37.2 C)] 98.4 F (36.9 C) (09/19 0524) Pulse Rate:  [72-97] 96 (09/19 0524) Resp:  [10-16] 16 (09/19 0524) BP: (89-132)/(59-93) 127/93 (09/19 0524) SpO2:  [93 %-98 %] 98 % (09/19 0524) Weight:  [78.9 kg (174 lb)] 78.9 kg (174 lb) (09/18 0919)  Intake/Output from previous day:  Intake/Output Summary (Last 24 hours) at 10/02/15 0814 Last data filed at 10/02/15 0747  Gross per 24 hour  Intake             2965 ml  Output             2675 ml  Net              290 ml    Intake/Output this shift: Total I/O In: 120 [P.O.:120] Out: -   Labs:  Recent Labs  10/02/15 0454  HGB 11.8*    Recent Labs  10/02/15 0454  WBC 15.1*  RBC 3.97  HCT 35.1*  PLT 269    Recent Labs  10/02/15 0454  NA 138  K 3.8  CL 104  CO2 27  BUN 10  CREATININE 0.57  GLUCOSE 108*  CALCIUM 8.8*   No results for input(s): LABPT, INR in the last 72 hours.  EXAM General - Patient is Alert, Appropriate and Oriented Extremity - Neurovascular intact Sensation intact distally Dorsiflexion/Plantar flexion intact Dressing - dressing C/D/I Motor Function - intact, moving foot and toes well on exam.  Hemovac pulled without difficulty.  Past Medical History:  Diagnosis Date  . Arthritis    "knees" (04/22/2012)  . Bronchitis   . Chronic bronchitis ([place])    "yearly; when the weather changes" (04/22/2012)  . Chronic kidney disease   . Colon polyps    adenomatous and hyperplastic-  . Constipation   . Depression   . Eczema   . Epilepsy ([place])    "been having them right  often here lately" (04/22/2012)  . Fatty liver   . [id number])    "1/wk" (04/22/2012)  . High cholesterol   . History of kidney stones   . Osteoarthritis    [name] 04/22/2012  . Other convulsions 05/21/12   non-epileptic spells  . Rectal bleed    in toilet- bright red  . Restless leg   . Seizures ([place])   . Vertigo     Assessment/Plan: 1 Day Post-Op Procedure(s) (LRB): RIGHT TOTAL KNEE ARTHROPLASTY (Right) Principal Problem:   OA (osteoarthritis) of knee  Estimated body mass index is 33.98 kg/m as calculated from the following:   Height as of this encounter: 5' (1.524 m).   Weight as of this encounter: 78.9 kg (174 lb). Advance diet Up with therapy Plan for discharge tomorrow Discharge home with home health  Pain management  DVT Prophylaxis - Xarelto Weight-Bearing as tolerated to right leg D/C O2 and Pulse OX and try on Room Air  Arlee Muslim, PA-C Orthopaedic Surgery 10/02/2015, 8:14 AM

## 2015-10-02 NOTE — Discharge Instructions (Addendum)
° °Dr. Frank Aluisio °Total Joint Specialist °[facility] °[address] °[place], [place] [address] °[phone] ° °TOTAL KNEE REPLACEMENT POSTOPERATIVE DIRECTIONS ° °Knee Rehabilitation, Guidelines Following Surgery  °Results after knee surgery are often greatly improved when you follow the exercise, range of motion and muscle strengthening exercises prescribed by your doctor. Safety measures are also important to protect the knee from further injury. Any time any of these exercises cause you to have increased pain or swelling in your knee joint, decrease the amount until you are comfortable again and slowly increase them. If you have problems or questions, call your caregiver or physical therapist for advice.  ° °HOME CARE INSTRUCTIONS  °Remove items at home which could result in a fall. This includes throw rugs or furniture in walking pathways.  °· ICE to the affected knee every three hours for 30 minutes at a time and then as needed for pain and swelling.  Continue to use ice on the knee for pain and swelling from surgery. You may notice swelling that will progress down to the foot and ankle.  This is normal after surgery.  Elevate the leg when you are not up walking on it.   °· Continue to use the breathing machine which will help keep your temperature down.  It is common for your temperature to cycle up and down following surgery, especially at night when you are not up moving around and exerting yourself.  The breathing machine keeps your lungs expanded and your temperature down. °· Do not place pillow under knee, focus on keeping the knee straight while resting ° °DIET °You may resume your previous home diet once your are discharged from the hospital. ° °DRESSING / WOUND CARE / SHOWERING °You may shower 3 days after surgery, but keep the wounds dry during showering.  You may use an occlusive plastic wrap (Press'n Seal for example), NO SOAKING/SUBMERGING IN THE BATHTUB.  If the  bandage gets wet, change with a clean dry gauze.  If the incision gets wet, pat the wound dry with a clean towel. °You may start showering once you are discharged home but do not submerge the incision under water. Just pat the incision dry and apply a dry gauze dressing on daily. °Change the surgical dressing daily and reapply a dry dressing each time. ° °ACTIVITY °Walk with your walker as instructed. °Use walker as long as suggested by your caregivers. °Avoid periods of inactivity such as sitting longer than an hour when not asleep. This helps prevent blood clots.  °You may resume a sexual relationship in one month or when given the OK by your doctor.  °You may return to work once you are cleared by your doctor.  °Do not drive a car for 6 weeks or until released by you surgeon.  °Do not drive while taking narcotics. ° °WEIGHT BEARING °Weight bearing as tolerated with assist device (walker, cane, etc) as directed, use it as long as suggested by your surgeon or therapist, typically at least 4-6 weeks. ° °POSTOPERATIVE CONSTIPATION PROTOCOL °Constipation - defined medically as fewer than three stools per week and severe constipation as less than one stool per week. ° °One of the most common issues patients have following surgery is constipation.  Even if you have a regular bowel pattern at home, your normal regimen is likely to be disrupted due to multiple reasons following surgery.  Combination of anesthesia, postoperative narcotics, change in appetite and fluid intake all can affect your bowels.    In order to avoid complications following surgery, here are some recommendations in order to help you during your recovery period. ° °Colace (docusate) - Pick up an over-the-counter form of Colace or another stool softener and take twice a day as long as you are requiring postoperative pain medications.  Take with a full glass of water daily.  If you experience loose stools or diarrhea, hold the colace until you stool forms  back up.  If your symptoms do not get better within 1 week or if they get worse, check with your doctor. ° °Dulcolax (bisacodyl) - Pick up over-the-counter and take as directed by the product packaging as needed to assist with the movement of your bowels.  Take with a full glass of water.  Use this product as needed if not relieved by Colace only.  ° °MiraLax (polyethylene glycol) - Pick up over-the-counter to have on hand.  MiraLax is a solution that will increase the amount of water in your bowels to assist with bowel movements.  Take as directed and can mix with a glass of water, juice, soda, coffee, or tea.  Take if you go more than two days without a movement. °Do not use MiraLax more than once per day. Call your doctor if you are still constipated or irregular after using this medication for 7 days in a row. ° °If you continue to have problems with postoperative constipation, please contact the office for further assistance and recommendations.  If you experience "the worst abdominal pain ever" or develop nausea or vomiting, please contact the office immediatly for further recommendations for treatment. ° °ITCHING ° If you experience itching with your medications, try taking only a single pain pill, or even half a pain pill at a time.  You can also use Benadryl over the counter for itching or also to help with sleep.  ° °TED HOSE STOCKINGS °Wear the elastic stockings on both legs for three weeks following surgery during the day but you may remove then at night for sleeping. ° °MEDICATIONS °See your medication summary on the “After Visit Summary” that the nursing staff will review with you prior to discharge.  You may have some home medications which will be placed on hold until you complete the course of blood thinner medication.  It is important for you to complete the blood thinner medication as prescribed by your surgeon.  Continue your approved medications as instructed at time of  discharge. ° °PRECAUTIONS °If you experience chest pain or shortness of breath - call 911 immediately for transfer to the hospital emergency department.  °If you develop a fever greater that 101 F, purulent drainage from wound, increased redness or drainage from wound, foul odor from the wound/dressing, or calf pain - CONTACT YOUR SURGEON.   °                                                °FOLLOW-UP APPOINTMENTS °Make sure you keep all of your appointments after your operation with your surgeon and caregivers. You should call the office at the above phone number and make an appointment for approximately two weeks after the date of your surgery or on the date instructed by your surgeon outlined in the "After Visit Summary". ° ° °RANGE OF MOTION AND STRENGTHENING EXERCISES  °Rehabilitation of the knee is important following a knee injury or   an operation. After just a few days of immobilization, the muscles of the thigh which control the knee become weakened and shrink (atrophy). Knee exercises are designed to build up the tone and strength of the thigh muscles and to improve knee motion. Often times heat used for twenty to thirty minutes before working out will loosen up your tissues and help with improving the range of motion but do not use heat for the first two weeks following surgery. These exercises can be done on a training (exercise) mat, on the floor, on a table or on a bed. Use what ever works the best and is most comfortable for you Knee exercises include:  °Leg Lifts - While your knee is still immobilized in a splint or cast, you can do straight leg raises. Lift the leg to 60 degrees, hold for 3 sec, and slowly lower the leg. Repeat 10-20 times 2-3 times daily. Perform this exercise against resistance later as your knee gets better.  °Quad and Hamstring Sets - Tighten up the muscle on the front of the thigh (Quad) and hold for 5-10 sec. Repeat this 10-20 times hourly. Hamstring sets are done by pushing the  foot backward against an object and holding for 5-10 sec. Repeat as with quad sets.  °· Leg Slides: Lying on your back, slowly slide your foot toward your buttocks, bending your knee up off the floor (only go as far as is comfortable). Then slowly slide your foot back down until your leg is flat on the floor again. °· Angel Wings: Lying on your back spread your legs to the side as far apart as you can without causing discomfort.  °A rehabilitation program following serious knee injuries can speed recovery and prevent re-injury in the future due to weakened muscles. Contact your doctor or a physical therapist for more information on knee rehabilitation.  ° °IF YOU ARE TRANSFERRED TO A SKILLED REHAB FACILITY °If the patient is transferred to a skilled rehab facility following release from the hospital, a list of the current medications will be sent to the facility for the patient to continue.  When discharged from the skilled rehab facility, please have the facility set up the patient's Home Health Physical Therapy prior to being released. Also, the skilled facility will be responsible for providing the patient with their medications at time of release from the facility to include their pain medication, the muscle relaxants, and their blood thinner medication. If the patient is still at the rehab facility at time of the two week follow up appointment, the skilled rehab facility will also need to assist the patient in arranging follow up appointment in our office and any transportation needs. ° °MAKE SURE YOU:  °Understand these instructions.  °Get help right away if you are not doing well or get worse.  ° ° °Pick up stool softner and laxative for home use following surgery while on pain medications. °Do not submerge incision under water. °Please use good hand washing techniques while changing dressing each day. °May shower starting three days after surgery. °Please use a clean towel to pat the incision dry following  showers. °Continue to use ice for pain and swelling after surgery. °Do not use any lotions or creams on the incision until instructed by your surgeon. ° °Take Xarelto for two and a half more weeks, then discontinue Xarelto. °Once the patient has completed the Xarelto, they may resume the 81 mg Aspirin. ° ° ° °Information on my medicine - XARELTO® (Rivaroxaban) ° °  This medication education was reviewed with me or my healthcare representative as part of my discharge preparation.  The pharmacist that spoke with me during my hospital stay was:  Glogovac,nikola, RPH ° °Why was Xarelto® prescribed for you? °Xarelto® was prescribed for you to reduce the risk of blood clots forming after orthopedic surgery. The medical term for these abnormal blood clots is venous thromboembolism (VTE). ° °What do you need to know about xarelto® ? °Take your Xarelto® ONCE DAILY at the same time every day. °You may take it either with or without food. ° °If you have difficulty swallowing the tablet whole, you may crush it and mix in applesauce just prior to taking your dose. ° °Take Xarelto® exactly as prescribed by your doctor and DO NOT stop taking Xarelto® without talking to the doctor who prescribed the medication.  Stopping without other VTE prevention medication to take the place of Xarelto® may increase your risk of developing a clot. ° °After discharge, you should have regular check-up appointments with your healthcare provider that is prescribing your Xarelto®.   ° °What do you do if you miss a dose? °If you miss a dose, take it as soon as you remember on the same day then continue your regularly scheduled once daily regimen the next day. Do not take two doses of Xarelto® on the same day.  ° °Important Safety Information °A possible side effect of Xarelto® is bleeding. You should call your healthcare provider right away if you experience any of the following: °? Bleeding from an injury or your nose that does not stop. °? Unusual  colored urine (red or dark brown) or unusual colored stools (red or black). °? Unusual bruising for unknown reasons. °? A serious fall or if you hit your head (even if there is no bleeding). ° °Some medicines may interact with Xarelto® and might increase your risk of bleeding while on Xarelto®. To help avoid this, consult your healthcare provider or pharmacist prior to using any new prescription or non-prescription medications, including herbals, vitamins, non-steroidal anti-inflammatory drugs (NSAIDs) and supplements. ° °This website has more information on Xarelto®: www.xarelto.com. ° ° °

## 2015-10-02 NOTE — Care Management Note (Signed)
Case Management Note  Patient Details  Name: NEVAEHA FINERTY MRN: [id number] Date of Birth: 1965/09/30  Subjective/Objective:                  RIGHT TOTAL KNEE ARTHROPLASTY (Right) Action/Plan: Discharge planning Expected Discharge Date:  10/03/15               Expected Discharge Plan:  [place]  In-House Referral:     Discharge planning Services  CM Consult  Post Acute Care Choice:  Home Health Choice offered to:  Patient  DME Arranged:  N/A DME Agency:  NA  HH Arranged:  PT HH Agency:     Status of Service:  Completed, signed off  If discussed at [place] of Stay Meetings, dates discussed:    Additional Comments: CM met with pt in room to offer choice of home health agency. Pt chooses [facility].  Referral given to [facility] rep, Tim (on unit).  Pt states she has both rolling walker and 3n1 at home.  No other CM needs were communicated. Abcde, Oneil, RN 10/02/2015, 11:46 AM

## 2015-10-02 NOTE — Evaluation (Signed)
Physical Therapy Evaluation Patient Details Name: [name] MRN: [id number] DOB: 1965/10/17 Today's Date: 10/02/2015   History of Present Illness  s/p R TKA, h/o L TKA 04/2015  Clinical Impression  The patient is progressing well. Appears very drowsy but able to ambulate and perform Ter ex. Pt admitted with above diagnosis. Pt currently with functional limitations due to the deficits listed below (see PT Problem List).  Pt will benefit from skilled PT to increase their independence and safety with mobility to allow discharge to the venue listed below.        Follow Up Recommendations Supervision/Assistance - 24 hour;Home health PT    Equipment Recommendations  None recommended by PT    Recommendations for Other Services       Precautions / Restrictions Precautions Precautions: Knee Required Braces or Orthoses: Knee Immobilizer - Right Knee Immobilizer - Right: Discontinue once straight leg raise with < 10 degree lag Restrictions Weight Bearing Restrictions: No Other Position/Activity Restrictions: WBAT      Mobility  Bed Mobility Overal bed mobility: Needs Assistance Bed Mobility: Supine to Sit;Sit to Supine     Supine to sit: Min assist Sit to supine: Min assist   General bed mobility comments: for RLE  Transfers Overall transfer level: Needs assistance Equipment used: Rolling walker (2 wheeled) Transfers: Sit to/from Stand Sit to Stand: Min guard         General transfer comment: cues UE/LE placement  Ambulation/Gait Ambulation/Gait assistance: Min guard Ambulation Distance (Feet): 60 Feet Assistive device: Rolling walker (2 wheeled) Gait Pattern/deviations: Step-to pattern;Step-through pattern;Antalgic     General Gait Details: cues for sequence  Stairs            Wheelchair Mobility    Modified Rankin (Stroke Patients Only)       Balance                                             Pertinent Vitals/Pain Pain  Assessment: 0-10 Pain Score: 4  Pain Location: rt knee Pain Descriptors / Indicators: Aching;Tightness Pain Intervention(s): Premedicated before session;Repositioned;Ice applied;Monitored during session    [place] expects to be discharged to:: Private residence Living Arrangements: Spouse/significant other Available Help at Discharge: Family;Available 24 hours/day (daughter in law to assist) Type of Home: House Home Access: Stairs to enter Entrance Stairs-Rails: Right;Left;Can reach both Entrance Stairs-Number of Steps: 3 Home Layout: One level Home Equipment: Walker - 2 wheels;Cane - quad;Cane - single point;Bedside commode;Tub bench      Prior Function Level of Independence: Independent with assistive device(s)         Comments: with cane     Hand Dominance   Dominant Hand: Right    Extremity/Trunk Assessment   Upper Extremity Assessment: Defer to OT evaluation           Lower Extremity Assessment: RLE deficits/detail RLE Deficits / Details: able to lift leg, knee flexion 45*       Communication   Communication: No difficulties  Cognition Arousal/Alertness: Awake/alert Behavior During Therapy: WFL for tasks assessed/performed Overall Cognitive Status: Within Functional Limits for tasks assessed                      General Comments      Exercises Total Joint Exercises Ankle Circles/Pumps: AROM;Both;10 reps Quad Sets: AROM;Both;10 reps Short Arc QuadSinclair Ship;Right;10 reps Heel  Slides: AAROM;Right;10 reps Hip ABduction/ADduction: AAROM;Right;10 reps Straight Leg Raises: Right;10 reps   Assessment/Plan    PT Assessment Patient needs continued PT services  PT Problem List Decreased strength;Decreased range of motion;Decreased activity tolerance;Decreased mobility;Decreased safety awareness;Decreased knowledge of precautions;Decreased knowledge of use of DME;Pain          PT Treatment Interventions DME instruction;Gait  training;Stair training;Functional mobility training;Therapeutic activities;Therapeutic exercise;Patient/family education    PT Goals (Current goals can be found in the Care Plan section)  Acute Rehab PT Goals Patient Stated Goal: regain IND PT Goal Formulation: With patient Time For Goal Achievement: 10/05/15 Potential to Achieve Goals: Good    Frequency 7X/week   Barriers to discharge        Co-evaluation               End of Session Equipment Utilized During Treatment: Right knee immobilizer Activity Tolerance: Patient tolerated treatment well Patient left: in bed;with call bell/phone within reach;with bed alarm set           Time: 1035-1101 PT Time Calculation (min) (ACUTE ONLY): 26 min   Charges:   PT Evaluation $PT Eval Low Complexity: 1 Procedure PT Treatments $Gait Training: 8-22 mins   PT G Codes:        [name] 10/02/2015, 11:05 AM

## 2015-10-03 LAB — CBC
HCT: 34.7 % — ABNORMAL LOW (ref 36.0–46.0)
Hemoglobin: 11.4 g/dL — ABNORMAL LOW (ref 12.0–15.0)
MCH: 29.4 pg (ref 26.0–34.0)
MCHC: 32.9 g/dL (ref 30.0–36.0)
MCV: 89.4 fL (ref 78.0–100.0)
Platelets: 269 10*3/uL (ref 150–400)
RBC: 3.88 MIL/uL (ref 3.87–5.11)
RDW: 14.3 % (ref 11.5–15.5)
WBC: 16.6 10*3/uL — ABNORMAL HIGH (ref 4.0–10.5)

## 2015-10-03 LAB — BASIC METABOLIC PANEL
Anion gap: 8 (ref 5–15)
BUN: 12 mg/dL (ref 6–20)
CO2: 29 mmol/L (ref 22–32)
Calcium: 8.9 mg/dL (ref 8.9–10.3)
Chloride: 99 mmol/L — ABNORMAL LOW (ref 101–111)
Creatinine, Ser: 0.57 mg/dL (ref 0.44–1.00)
GFR calc Af Amer: >60 mL/min (ref >60)
GFR calc non Af Amer: >60 mL/min (ref >60)
Glucose, Bld: 125 mg/dL — ABNORMAL HIGH (ref 65–99)
Potassium: 3.4 mmol/L — ABNORMAL LOW (ref 3.5–5.1)
Sodium: 136 mmol/L (ref 135–145)

## 2015-10-03 MED ORDER — HYDROXYZINE HCL 25 MG PO TABS: 25.0000 mg | ORAL_TABLET | Freq: Four times a day (QID) | ORAL | 0 refills | Status: DC | PRN

## 2015-10-03 MED ORDER — HYDROMORPHONE HCL 2 MG PO TABS: 2.0000 mg | ORAL_TABLET | ORAL | 0 refills | Status: DC | PRN

## 2015-10-03 MED ORDER — CYCLOBENZAPRINE HCL 10 MG PO TABS
10.0000 mg | ORAL_TABLET | Freq: Three times a day (TID) | ORAL | 0 refills | Status: DC | PRN
Start: 2015-10-03 — End: 2015-12-25

## 2015-10-03 MED ORDER — RIVAROXABAN 10 MG PO TABS: 10.0000 mg | ORAL_TABLET | Freq: Every day | ORAL | 0 refills | Status: DC

## 2015-10-03 MED ORDER — ONDANSETRON HCL 4 MG PO TABS: 4.0000 mg | ORAL_TABLET | Freq: Four times a day (QID) | ORAL | 0 refills | Status: DC | PRN

## 2015-10-03 NOTE — Progress Notes (Signed)
Physical Therapy Treatment Patient Details Name: [name] MRN: [id number] DOB: 08/21/1965 Today's Date: 10/03/2015    History of Present Illness s/p R TKA, h/o L TKA 04/2015    PT Comments    Progressing well. Will practice steps next visit.  Follow Up Recommendations        Equipment Recommendations  None recommended by PT    Recommendations for Other Services       Precautions / Restrictions Precautions Precautions: Knee Precaution Comments: did not use KI    Mobility  Bed Mobility Overal bed mobility: Needs Assistance Bed Mobility: Supine to Sit     Supine to sit: Supervision     General bed mobility comments: manages  rt leg  Transfers   Equipment used: Rolling walker (2 wheeled) Transfers: Sit to/from Stand Sit to Stand: Supervision         General transfer comment: cues UE/LE placement  Ambulation/Gait   Ambulation Distance (Feet): 100 Feet Assistive device: Rolling walker (2 wheeled) Gait Pattern/deviations: Step-to pattern;Step-through pattern     General Gait Details: cues to place the foot flat, is tending to be on the toes.   Stairs            Wheelchair Mobility    Modified Rankin (Stroke Patients Only)       Balance                                    Cognition Arousal/Alertness: Awake/alert                          Exercises Total Joint Exercises Ankle Circles/Pumps: AROM;Both;10 reps Quad Sets: AROM;Both;10 reps Towel Squeeze: AROM;Both;10 reps Short Arc QuadSinclair Ship;Right;10 reps Heel Slides: AAROM;Right;10 reps Hip ABduction/ADduction: AAROM;Right;10 reps Straight Leg Raises: Right;10 reps Goniometric ROM: 15-50 leftknee    General Comments        Pertinent Vitals/Pain Pain Score: 6  Pain Location: rt knee Pain Descriptors / Indicators: Discomfort;Sore Pain Intervention(s): Limited activity within patient's tolerance;Premedicated before session;Repositioned;Ice applied     Home Living Family/patient expects to be discharged to:: Private residence                    Prior Function            PT Goals (current goals can now be found in the care plan section) Progress towards PT goals: Progressing toward goals    Frequency           PT Plan Current plan remains appropriate    Co-evaluation             End of Session   Activity Tolerance: Patient tolerated treatment well Patient left: in chair;with call bell/phone within reach     Time: [id number] PT Time Calculation (min) (ACUTE ONLY): 26 min  Charges:  $Gait Training: 8-22 mins $Therapeutic Exercise: 8-22 mins                    G Codes:      [name] 10/03/2015, 1:55 PM

## 2015-10-03 NOTE — Progress Notes (Signed)
Physical Therapy Treatment Patient Details Name: [name] MRN: [id number] DOB: 02-14-1965 Today's Date: 10/03/2015    History of Present Illness s/p R TKA, h/o L TKA 04/2015    PT Comments    Ready for DC.  Follow Up Recommendations  Supervision/Assistance - 24 hour;Home health PT     Equipment Recommendations  None recommended by PT    Recommendations for Other Services       Precautions / Restrictions Precautions Precautions: Knee Precaution Comments: did not use KI    Mobility   Transfers   Equipment used: Rolling walker (2 wheeled) Transfers: Sit to/from Stand Sit to Stand: Supervision         General transfer comment: cues UE/LE placement  Ambulation/Gait Ambulation/Gait assistance: Supervision Ambulation Distance (Feet): 50 Feet Assistive device: Rolling walker (2 wheeled) Gait Pattern/deviations: Step-to pattern     General Gait Details: cues to place the foot flat, is tending to be on the toes.   Stairs Stairs: Yes Stairs assistance: Min assist Stair Management: Two rails;Step to pattern;Forwards Number of Stairs: 3 General stair comments: practiced x 2  Wheelchair Mobility    Modified Rankin (Stroke Patients Only)       Balance                                    Cognition Arousal/Alertness: Awake/alert                          Exercises     General Comments        Pertinent Vitals/Pain Pain Score: 6  Pain Location: rt knee Pain Descriptors / Indicators: Sore Pain Intervention(s): Premedicated before session;Monitored during session    Home Living Family/patient expects to be discharged to:: Private residence                    Prior Function            PT Goals (current goals can now be found in the care plan section) Progress towards PT goals: Progressing toward goals    Frequency           PT Plan Current plan remains appropriate    Co-evaluation              End of Session   Activity Tolerance: Patient tolerated treatment well Patient left: in chair;with call bell/phone within reach;with family/visitor present     Time: 1223-1233 PT Time Calculation (min) (ACUTE ONLY): 10 min  Charges:  $Gait Training: 8-22 mins $Therapeutic Exercise: 8-22 mins                    G Codes:      [name] 10/03/2015, 1:57 PM

## 2015-10-03 NOTE — Progress Notes (Signed)
   Subjective: 2 Days Post-Op Procedure(s) (LRB): RIGHT TOTAL KNEE ARTHROPLASTY (Right) Patient reports pain as mild and moderate.   Patient seen in rounds with Dr. Wynelle Link. Patient is well, but has had some minor complaints of pain in the knee, requiring pain medications Patient is ready to go home  Objective: Vital signs in last 24 hours: Temp:  [98.1 F (36.7 C)-98.5 F (36.9 C)] 98.5 F (36.9 C) (09/20 0521) Pulse Rate:  [96-100] 99 (09/20 0521) Resp:  [18-20] 18 (09/20 0521) BP: (96-117)/(63-76) 96/69 (09/20 0521) SpO2:  [92 %-96 %] 92 % (09/20 0521)  Intake/Output from previous day:  Intake/Output Summary (Last 24 hours) at 10/03/15 0811 Last data filed at 10/03/15 0449  Gross per 24 hour  Intake          1616.25 ml  Output              850 ml  Net           766.25 ml    Intake/Output this shift: No intake/output data recorded.  Labs:  Recent Labs  10/02/15 0454 10/03/15 0423  HGB 11.8* 11.4*    Recent Labs  10/02/15 0454 10/03/15 0423  WBC 15.1* 16.6*  RBC 3.97 3.88  HCT 35.1* 34.7*  PLT 269 269    Recent Labs  10/02/15 0454 10/03/15 0423  NA 138 136  K 3.8 3.4*  CL 104 99*  CO2 27 29  BUN 10 12  CREATININE 0.57 0.57  GLUCOSE 108* 125*  CALCIUM 8.8* 8.9   No results for input(s): LABPT, INR in the last 72 hours.  EXAM: General - Patient is Alert and Appropriate Extremity - Neurovascular intact Sensation intact distally Incision - clean, dry, no drainage Motor Function - intact, moving foot and toes well on exam.   Assessment/Plan: 2 Days Post-Op Procedure(s) (LRB): RIGHT TOTAL KNEE ARTHROPLASTY (Right) Procedure(s) (LRB): RIGHT TOTAL KNEE ARTHROPLASTY (Right) Past Medical History:  Diagnosis Date  . Arthritis    "knees" (04/22/2012)  . Bronchitis   . Chronic bronchitis ([place])    "yearly; when the weather changes" (04/22/2012)  . Chronic kidney disease   . Colon polyps    adenomatous and hyperplastic-  . Constipation   .  Depression   . Eczema   . Epilepsy ([place])    "been having them right often here lately" (04/22/2012)  . Fatty liver   . [id number])    "1/wk" (04/22/2012)  . High cholesterol   . History of kidney stones   . Osteoarthritis    [name] 04/22/2012  . Other convulsions 05/21/12   non-epileptic spells  . Rectal bleed    in toilet- bright red  . Restless leg   . Seizures ([place])   . Vertigo    Principal Problem:   OA (osteoarthritis) of knee  Estimated body mass index is 33.98 kg/m as calculated from the following:   Height as of this encounter: 5' (1.524 m).   Weight as of this encounter: 78.9 kg (174 lb). Discharge home with home health Diet - Renal diet Follow up - in 2 weeks Activity - WBAT Disposition - Home Condition Upon Discharge - Good D/C Meds - See DC Summary DVT Prophylaxis - Xarelto  Arlee Muslim, PA-C Orthopaedic Surgery 10/03/2015, 8:11 AM

## 2015-10-03 NOTE — Discharge Summary (Signed)
Physician Discharge Summary   Patient ID: [name] MRN: [id number] DOB/AGE: 1965/02/07 50 y.o.  Admit date: 10/01/2015 Discharge date: 10/03/15  Primary Diagnosis:  Osteoarthritis  Right knee(s)  Admission Diagnoses:  Past Medical History:  Diagnosis Date  . Arthritis    "knees" (04/22/2012)  . Bronchitis   . Chronic bronchitis ([place])    "yearly; when the weather changes" (04/22/2012)  . Chronic kidney disease   . Colon polyps    adenomatous and hyperplastic-  . Constipation   . Depression   . Eczema   . Epilepsy ([place])    "been having them right often here lately" (04/22/2012)  . Fatty liver   . TXMIWOEH(212.2)    "1/wk" (04/22/2012)  . High cholesterol   . History of kidney stones   . Osteoarthritis    [name] 04/22/2012  . Other convulsions 05/21/12   non-epileptic spells  . Rectal bleed    in toilet- bright red  . Restless leg   . Seizures ([place])   . Vertigo    Discharge Diagnoses:   Principal Problem:   OA (osteoarthritis) of knee  Estimated body mass index is 33.98 kg/m as calculated from the following:   Height as of this encounter: 5' (1.524 m).   Weight as of this encounter: 78.9 kg (174 lb).  Procedure:  Procedure(s) (LRB): RIGHT TOTAL KNEE ARTHROPLASTY (Right)   Consults: None  HPI: [name] is a 50 y.o. year old female with end stage OA of her right knee with progressively worsening pain and dysfunction. She has constant pain, with activity and at rest and significant functional deficits with difficulties even with ADLs. She has had extensive non-op management including analgesics, injections of cortisone and viscosupplements, and home exercise program, but remains in significant pain with significant dysfunction.Radiographs show bone on bone arthritis medial and patellofemoral. She presents now for right Total Knee Arthroplasty.    Laboratory Data: Admission on 10/01/2015  Component Date Value Ref Range Status  . WBC 10/02/2015 15.1* 4.0 -  10.5 K/uL Final  . RBC 10/02/2015 3.97  3.87 - 5.11 MIL/uL Final  . Hemoglobin 10/02/2015 11.8* 12.0 - 15.0 g/dL Final  . HCT 10/02/2015 35.1* 36.0 - 46.0 % Final  . MCV 10/02/2015 88.4  78.0 - 100.0 fL Final  . MCH 10/02/2015 29.7  26.0 - 34.0 pg Final  . MCHC 10/02/2015 33.6  30.0 - 36.0 g/dL Final  . RDW 10/02/2015 14.1  11.5 - 15.5 % Final  . Platelets 10/02/2015 269  150 - 400 K/uL Final  . Sodium 10/02/2015 138  135 - 145 mmol/L Final  . Potassium 10/02/2015 3.8  3.5 - 5.1 mmol/L Final  . Chloride 10/02/2015 104  101 - 111 mmol/L Final  . CO2 10/02/2015 27  22 - 32 mmol/L Final  . Glucose, Bld 10/02/2015 108* 65 - 99 mg/dL Final  . BUN 10/02/2015 10  6 - 20 mg/dL Final  . Creatinine, Ser 10/02/2015 0.57  0.44 - 1.00 mg/dL Final  . Calcium 10/02/2015 8.8* 8.9 - 10.3 mg/dL Final  . GFR calc non Af Amer 10/02/2015 >60  >60 mL/min Final  . GFR calc Af Amer 10/02/2015 >60  >60 mL/min Final   Comment: (NOTE) The eGFR has been calculated using the CKD EPI equation. This calculation has not been validated in all clinical situations. eGFR's persistently <60 mL/min signify possible Chronic Kidney Disease.   . Anion gap 10/02/2015 7  5 - 15 Final  . WBC 10/03/2015 16.6* 4.0 -  10.5 K/uL Final  . RBC 10/03/2015 3.88  3.87 - 5.11 MIL/uL Final  . Hemoglobin 10/03/2015 11.4* 12.0 - 15.0 g/dL Final  . HCT 10/03/2015 34.7* 36.0 - 46.0 % Final  . MCV 10/03/2015 89.4  78.0 - 100.0 fL Final  . MCH 10/03/2015 29.4  26.0 - 34.0 pg Final  . MCHC 10/03/2015 32.9  30.0 - 36.0 g/dL Final  . RDW 10/03/2015 14.3  11.5 - 15.5 % Final  . Platelets 10/03/2015 269  150 - 400 K/uL Final  . Sodium 10/03/2015 136  135 - 145 mmol/L Final  . Potassium 10/03/2015 3.4* 3.5 - 5.1 mmol/L Final  . Chloride 10/03/2015 99* 101 - 111 mmol/L Final  . CO2 10/03/2015 29  22 - 32 mmol/L Final  . Glucose, Bld 10/03/2015 125* 65 - 99 mg/dL Final  . BUN 10/03/2015 12  6 - 20 mg/dL Final  . Creatinine, Ser 10/03/2015 0.57   0.44 - 1.00 mg/dL Final  . Calcium 10/03/2015 8.9  8.9 - 10.3 mg/dL Final  . GFR calc non Af Amer 10/03/2015 >60  >60 mL/min Final  . GFR calc Af Amer 10/03/2015 >60  >60 mL/min Final   Comment: (NOTE) The eGFR has been calculated using the CKD EPI equation. This calculation has not been validated in all clinical situations. eGFR's persistently <60 mL/min signify possible Chronic Kidney Disease.   Georgiann Hahn gap 10/03/2015 8  5 - 15 Final  Hospital Outpatient Visit on 09/25/2015  Component Date Value Ref Range Status  . MRSA, PCR 09/25/2015 INVALID RESULTS, SPECIMEN SENT FOR CULTURE* NEGATIVE Final  . Staphylococcus aureus 09/25/2015 INVALID RESULTS, SPECIMEN SENT FOR CULTURE* NEGATIVE Final   Comment:        The Xpert SA Assay (FDA approved for NASAL specimens in patients over 60 years of age), is one component of a comprehensive surveillance program.  Test performance has been validated by [facility] for patients greater than or equal to 30 year old. It is not intended to diagnose infection nor to guide or monitor treatment. INFORMED KAY, PADM _0  ON 9.12.17 BY MCCOY,N.   . aPTT 09/25/2015 31  24 - 36 seconds Final  . WBC 09/25/2015 7.4  4.0 - 10.5 K/uL Final  . RBC 09/25/2015 4.49  3.87 - 5.11 MIL/uL Final  . Hemoglobin 09/25/2015 13.0  12.0 - 15.0 g/dL Final  . HCT 09/25/2015 39.0  36.0 - 46.0 % Final  . MCV 09/25/2015 86.9  78.0 - 100.0 fL Final  . MCH 09/25/2015 29.0  26.0 - 34.0 pg Final  . MCHC 09/25/2015 33.3  30.0 - 36.0 g/dL Final  . RDW 09/25/2015 14.4  11.5 - 15.5 % Final  . Platelets 09/25/2015 321  150 - 400 K/uL Final  . Sodium 09/25/2015 139  135 - 145 mmol/L Final  . Potassium 09/25/2015 4.5  3.5 - 5.1 mmol/L Final  . Chloride 09/25/2015 103  101 - 111 mmol/L Final  . CO2 09/25/2015 29  22 - 32 mmol/L Final  . Glucose, Bld 09/25/2015 90  65 - 99 mg/dL Final  . BUN 09/25/2015 15  6 - 20 mg/dL Final  . Creatinine, Ser 09/25/2015 0.72  0.44 - 1.00 mg/dL  Final  . Calcium 09/25/2015 10.1  8.9 - 10.3 mg/dL Final  . Total Protein 09/25/2015 7.4  6.5 - 8.1 g/dL Final  . Albumin 09/25/2015 4.8  3.5 - 5.0 g/dL Final  . AST 09/25/2015 65* 15 - 41 U/L Final  . ALT 09/25/2015 68*  14 - 54 U/L Final  . Alkaline Phosphatase 09/25/2015 70  38 - 126 U/L Final  . Total Bilirubin 09/25/2015 0.9  0.3 - 1.2 mg/dL Final  . GFR calc non Af Amer 09/25/2015 >60  >60 mL/min Final  . GFR calc Af Amer 09/25/2015 >60  >60 mL/min Final   Comment: (NOTE) The eGFR has been calculated using the CKD EPI equation. This calculation has not been validated in all clinical situations. eGFR's persistently <60 mL/min signify possible Chronic Kidney Disease.   . Anion gap 09/25/2015 7  5 - 15 Final  . Prothrombin Time 09/25/2015 13.3  11.4 - 15.2 seconds Final  . INR 09/25/2015 1.01   Final  . ABO/RH(D) 10/01/2015 A POS   Final  . Antibody Screen 10/01/2015 NEG   Final  . Sample Expiration 10/01/2015 10/04/2015   Final  . Extend sample reason 10/01/2015 NO TRANSFUSIONS OR PREGNANCY IN THE PAST 3 MONTHS   Final  . Color, Urine 09/25/2015 YELLOW  YELLOW Final  . APPearance 09/25/2015 CLEAR  CLEAR Final  . Specific Gravity, Urine 09/25/2015 1.008  1.005 - 1.030 Final  . pH 09/25/2015 6.0  5.0 - 8.0 Final  . Glucose, UA 09/25/2015 NEGATIVE  NEGATIVE mg/dL Final  . Hgb urine dipstick 09/25/2015 NEGATIVE  NEGATIVE Final  . Bilirubin Urine 09/25/2015 NEGATIVE  NEGATIVE Final  . Ketones, ur 09/25/2015 NEGATIVE  NEGATIVE mg/dL Final  . Protein, ur 09/25/2015 NEGATIVE  NEGATIVE mg/dL Final  . Nitrite 09/25/2015 NEGATIVE  NEGATIVE Final  . Leukocytes, UA 09/25/2015 NEGATIVE  NEGATIVE Final  . Specimen Description 09/27/2015 NOSE   Final  . Special Requests 09/27/2015 NONE   Final  . Culture 09/27/2015    Final                   Value:NO MRSA DETECTED Performed at [facility]   . Report Status 09/27/2015 09/27/2015 FINAL   Final  Office Visit on 09/21/2015  Component  Date Value Ref Range Status  . Bayer DCA Hb A1c Waived 09/21/2015 5.4  <7.0 % Final   Comment:                                       Diabetic Adult            <7.0                                       Healthy Adult        4.3 - 5.7                                                           (DCCT/NGSP) American Diabetes Association's Summary of Glycemic Recommendations for Adults with Diabetes: Hemoglobin A1c <7.0%. More stringent glycemic goals (A1c <6.0%) may further reduce complications at the cost of increased risk of hypoglycemia.   Admission on 09/17/2015, Discharged on 09/17/2015  Component Date Value Ref Range Status  . Sodium 09/17/2015 144  135 - 145 mmol/L Final  . Potassium 09/17/2015 3.8  3.5 - 5.1 mmol/L Final  . Chloride 09/17/2015 107  101 - 111  mmol/L Final  . BUN 09/17/2015 5* 6 - 20 mg/dL Final  . Creatinine, Ser 09/17/2015 0.60  0.44 - 1.00 mg/dL Final  . Glucose, Bld 09/17/2015 85  65 - 99 mg/dL Final  . Calcium, Ion 09/17/2015 1.11* 1.15 - 1.40 mmol/L Final  . TCO2 09/17/2015 24  0 - 100 mmol/L Final  . Hemoglobin 09/17/2015 12.9  12.0 - 15.0 g/dL Final  . HCT 09/17/2015 38.0  36.0 - 46.0 % Final  . Glucose-Capillary 09/17/2015 83  65 - 99 mg/dL Final     X-Rays:Dg Knee Complete 4 Views Left  Result Date: 09/17/2015 CLINICAL DATA:  Status post fall today with a left knee injury. Pain. Initial encounter. EXAM: LEFT KNEE - COMPLETE 4+ VIEW COMPARISON:  Plain films left knee 11/14/2014. FINDINGS: The patient has undergone left knee replacement since the prior examination. Hardware is intact without complicating feature. No acute bony or joint abnormality is seen. Small effusion is noted. IMPRESSION: No acute abnormality. Status post left knee replacement without evidence complication. Small joint effusion. Electronically Signed   By: Inge Rise M.D.   On: 09/17/2015 17:58    EKG: Orders placed or performed during the hospital encounter of 09/17/15  . EKG 12-Lead   . EKG 12-Lead  . EKG     Hospital Course: [name] is a 50 y.o. who was admitted to [facility]. They were brought to the operating room on 10/01/2015 and underwent Procedure(s): RIGHT TOTAL KNEE ARTHROPLASTY.  Patient tolerated the procedure well and was later transferred to the recovery room and then to the orthopaedic floor for postoperative care.  They were given PO and IV analgesics for pain control following their surgery.  They were given 24 hours of postoperative antibiotics of  Anti-infectives    Start     Dose/Rate Route Frequency Ordered Stop   10/01/15 2300  vancomycin (VANCOCIN) IVPB 1000 mg/200 mL premix     1,000 mg 200 mL/hr over 60 Minutes Intravenous Every 12 hours 10/01/15 1424 10/02/15 0101   10/01/15 0856  vancomycin (VANCOCIN) IVPB 1000 mg/200 mL premix     1,000 mg 200 mL/hr over 60 Minutes Intravenous On call to O.R. 10/01/15 9417 10/01/15 1054     and started on DVT prophylaxis in the form of Xarelto.   PT and OT were ordered for total joint protocol.  Discharge planning consulted to help with postop disposition and equipment needs.  Patient had a decent night on the evening of surgery.  They started to get up OOB with therapy on day one. Hemovac drain was pulled without difficulty.  Continued to work with therapy into day two.  Dressing was changed on day two and the incision was healing well.   Patient was seen in rounds on POD 2 and was ready to go home.   Discharge home with home health Diet - Renal diet Follow up - in 2 weeks Activity - WBAT Disposition - Home Condition Upon Discharge - Good D/C Meds - See DC Summary DVT Prophylaxis - Xarelto  Discharge Instructions    Call MD / Call 911    Complete by:  As directed    If you experience chest pain or shortness of breath, CALL 911 and be transported to the hospital emergency room.  If you develope a fever above 101 F, pus (white drainage) or increased drainage or redness at the wound, or  calf pain, call your surgeon's office.   Change dressing    Complete by:  As directed    Change dressing daily with sterile 4 x 4 inch gauze dressing and apply TED hose. Do not submerge the incision under water.   Constipation Prevention    Complete by:  As directed    Drink plenty of fluids.  Prune juice may be helpful.  You may use a stool softener, such as Colace (over the counter) 100 mg twice a day.  Use MiraLax (over the counter) for constipation as needed.   Diet - low sodium heart healthy    Complete by:  As directed    Discharge instructions    Complete by:  As directed    Pick up stool softner and laxative for home use following surgery while on pain medications. Do not submerge incision under water. Please use good hand washing techniques while changing dressing each day. May shower starting three days after surgery. Please use a clean towel to pat the incision dry following showers. Continue to use ice for pain and swelling after surgery. Do not use any lotions or creams on the incision until instructed by your surgeon.   Postoperative Constipation Protocol  Constipation - defined medically as fewer than three stools per week and severe constipation as less than one stool per week.  One of the most common issues patients have following surgery is constipation.  Even if you have a regular bowel pattern at home, your normal regimen is likely to be disrupted due to multiple reasons following surgery.  Combination of anesthesia, postoperative narcotics, change in appetite and fluid intake all can affect your bowels.  In order to avoid complications following surgery, here are some recommendations in order to help you during your recovery period.  Colace (docusate) - Pick up an over-the-counter form of Colace or another stool softener and take twice a day as long as you are requiring postoperative pain medications.  Take with a full glass of water daily.  If you experience loose  stools or diarrhea, hold the colace until you stool forms back up.  If your symptoms do not get better within 1 week or if they get worse, check with your doctor.  Dulcolax (bisacodyl) - Pick up over-the-counter and take as directed by the product packaging as needed to assist with the movement of your bowels.  Take with a full glass of water.  Use this product as needed if not relieved by Colace only.   MiraLax (polyethylene glycol) - Pick up over-the-counter to have on hand.  MiraLax is a solution that will increase the amount of water in your bowels to assist with bowel movements.  Take as directed and can mix with a glass of water, juice, soda, coffee, or tea.  Take if you go more than two days without a movement. Do not use MiraLax more than once per day. Call your doctor if you are still constipated or irregular after using this medication for 7 days in a row.  If you continue to have problems with postoperative constipation, please contact the office for further assistance and recommendations.  If you experience "the worst abdominal pain ever" or develop nausea or vomiting, please contact the office immediatly for further recommendations for treatment.   Take Xarelto for two and a half more weeks, then discontinue Xarelto. Once the patient has completed the Xarelto, they may resume the 81 mg Aspirin.   Do not put a pillow under the knee. Place it under the heel.    Complete by:  As directed  Do not sit on low chairs, stoools or toilet seats, as it may be difficult to get up from low surfaces    Complete by:  As directed    Driving restrictions    Complete by:  As directed    No driving until released by the physician.   Increase activity slowly as tolerated    Complete by:  As directed    Lifting restrictions    Complete by:  As directed    No lifting until released by the physician.   Patient may shower    Complete by:  As directed    You may shower without a dressing once there  is no drainage.  Do not wash over the wound.  If drainage remains, do not shower until drainage stops.   TED hose    Complete by:  As directed    Use stockings (TED hose) for 3 weeks on both leg(s).  You may remove them at night for sleeping.   Weight bearing as tolerated    Complete by:  As directed    Laterality:  right   Extremity:  Lower       Medication List    STOP taking these medications   MULTIVITAMIN ADULT PO   vitamin B-12 100 MCG tablet Commonly known as:  CYANOCOBALAMIN     TAKE these medications   citalopram 40 MG tablet Commonly known as:  CELEXA TAKE ONE TABLET BY MOUTH ONCE DAILY   clonazePAM 0.5 MG tablet Commonly known as:  KLONOPIN Take 1 tablet (0.5 mg total) by mouth at bedtime.   cyclobenzaprine 10 MG tablet Commonly known as:  FLEXERIL Take 1 tablet (10 mg total) by mouth 3 (three) times daily as needed. What changed:  reasons to take this   HYDROmorphone 2 MG tablet Commonly known as:  DILAUDID Take 1-2 tablets (2-4 mg total) by mouth every 3 (three) hours as needed for severe pain.   hydrOXYzine 25 MG tablet Commonly known as:  ATARAX/VISTARIL Take 1 tablet (25 mg total) by mouth every 6 (six) hours as needed for itching.   lamoTRIgine 100 MG tablet Commonly known as:  LAMICTAL TAKE ONE TABLET BY MOUTH TWICE DAILY   levETIRAcetam 500 MG tablet Commonly known as:  KEPPRA Take 3 tablets PO in the morning and 3 tablets PO in the evening.   ondansetron 4 MG tablet Commonly known as:  ZOFRAN Take 1 tablet (4 mg total) by mouth every 6 (six) hours as needed for nausea.   pramipexole 0.5 MG tablet Commonly known as:  MIRAPEX TAKE ONE TABLET BY MOUTH TWICE DAILY   pregabalin 100 MG capsule Commonly known as:  LYRICA Take 1 capsule (100 mg total) by mouth 2 (two) times daily.   rivaroxaban 10 MG Tabs tablet Commonly known as:  XARELTO Take 1 tablet (10 mg total) by mouth daily with breakfast. Take Xarelto for two and a half more weeks,  then discontinue Xarelto. Once the patient has completed the Xarelto, they may resume the 81 mg Aspirin.      Follow-up Information    [facility] .   Why:  now known as [facility].  This agency will call you to schedule your home health physical therapy, Contact information: [address] [place] [address] [phone]        Gearlean Alf, MD. Schedule an appointment as soon as possible for a visit on 10/16/2015.   Specialty:  Orthopedic Surgery Contact information: [address]  [place] [address] [phone]           Signed: Arlee Muslim, PA-C Orthopaedic Surgery 10/03/2015, 8:16 AM

## 2015-10-04 DIAGNOSIS — Z471 Aftercare following joint replacement surgery: Secondary | ICD-10-CM | POA: Diagnosis not present

## 2015-10-04 DIAGNOSIS — G40201 Localization-related (focal) (partial) symptomatic epilepsy and epileptic syndromes with complex partial seizures, not intractable, with status epilepticus: Secondary | ICD-10-CM | POA: Diagnosis not present

## 2015-10-04 DIAGNOSIS — Z96653 Presence of artificial knee joint, bilateral: Secondary | ICD-10-CM | POA: Diagnosis not present

## 2015-10-04 DIAGNOSIS — Z7901 Long term (current) use of anticoagulants: Secondary | ICD-10-CM | POA: Diagnosis not present

## 2015-10-04 DIAGNOSIS — F321 Major depressive disorder, single episode, moderate: Secondary | ICD-10-CM | POA: Diagnosis not present

## 2015-10-04 DIAGNOSIS — G4701 Insomnia due to medical condition: Secondary | ICD-10-CM | POA: Diagnosis not present

## 2015-10-04 DIAGNOSIS — Z79899 Other long term (current) drug therapy: Secondary | ICD-10-CM | POA: Diagnosis not present

## 2015-10-04 DIAGNOSIS — Z96642 Presence of left artificial hip joint: Secondary | ICD-10-CM | POA: Diagnosis not present

## 2015-10-04 DIAGNOSIS — M19071 Primary osteoarthritis, right ankle and foot: Secondary | ICD-10-CM | POA: Diagnosis not present

## 2015-10-04 DIAGNOSIS — G4730 Sleep apnea, unspecified: Secondary | ICD-10-CM | POA: Diagnosis not present

## 2015-10-08 DIAGNOSIS — M19071 Primary osteoarthritis, right ankle and foot: Secondary | ICD-10-CM | POA: Diagnosis not present

## 2015-10-08 DIAGNOSIS — Z79899 Other long term (current) drug therapy: Secondary | ICD-10-CM | POA: Diagnosis not present

## 2015-10-08 DIAGNOSIS — G4730 Sleep apnea, unspecified: Secondary | ICD-10-CM | POA: Diagnosis not present

## 2015-10-08 DIAGNOSIS — G4701 Insomnia due to medical condition: Secondary | ICD-10-CM | POA: Diagnosis not present

## 2015-10-08 DIAGNOSIS — Z96653 Presence of artificial knee joint, bilateral: Secondary | ICD-10-CM | POA: Diagnosis not present

## 2015-10-08 DIAGNOSIS — Z96642 Presence of left artificial hip joint: Secondary | ICD-10-CM | POA: Diagnosis not present

## 2015-10-08 DIAGNOSIS — Z7901 Long term (current) use of anticoagulants: Secondary | ICD-10-CM | POA: Diagnosis not present

## 2015-10-08 DIAGNOSIS — Z471 Aftercare following joint replacement surgery: Secondary | ICD-10-CM | POA: Diagnosis not present

## 2015-10-08 DIAGNOSIS — F321 Major depressive disorder, single episode, moderate: Secondary | ICD-10-CM | POA: Diagnosis not present

## 2015-10-08 DIAGNOSIS — G40201 Localization-related (focal) (partial) symptomatic epilepsy and epileptic syndromes with complex partial seizures, not intractable, with status epilepticus: Secondary | ICD-10-CM | POA: Diagnosis not present

## 2015-10-15 ENCOUNTER — Other Ambulatory Visit: Payer: Self-pay | Admitting: Family Medicine

## 2015-10-17 ENCOUNTER — Ambulatory Visit: Payer: PPO | Admitting: Physical Therapy

## 2015-10-22 ENCOUNTER — Ambulatory Visit (INDEPENDENT_AMBULATORY_CARE_PROVIDER_SITE_OTHER): Payer: PPO | Admitting: Family Medicine

## 2015-10-22 ENCOUNTER — Ambulatory Visit: Payer: PPO | Admitting: Physical Therapy

## 2015-10-22 ENCOUNTER — Ambulatory Visit: Payer: PPO | Attending: Orthopedic Surgery | Admitting: Physical Therapy

## 2015-10-22 VITALS — BP 98/67 | HR 89 | Temp 98.2°F | Ht 60.0 in | Wt 162.0 lb

## 2015-10-22 DIAGNOSIS — M25561 Pain in right knee: Secondary | ICD-10-CM | POA: Insufficient documentation

## 2015-10-22 DIAGNOSIS — H532 Diplopia: Secondary | ICD-10-CM | POA: Diagnosis not present

## 2015-10-22 DIAGNOSIS — R600 Localized edema: Secondary | ICD-10-CM | POA: Insufficient documentation

## 2015-10-22 DIAGNOSIS — M25661 Stiffness of right knee, not elsewhere classified: Secondary | ICD-10-CM | POA: Insufficient documentation

## 2015-10-22 NOTE — Therapy (Signed)
[facility] [place], [place], [address] Phone: [phone]   Fax:  [phone]  Physical Therapy Evaluation  Patient Details  Name: [name] MRN: [id number] Date of Birth: 03-19-1965 Referring Provider: [name] MD.  Encounter Date: 10/22/2015      PT End of Session - 10/22/15 1221    Visit Number 1   Number of Visits 12   Date for PT Re-Evaluation 12/21/15   PT Start Time 1120   PT Stop Time 1218   PT Time Calculation (min) 58 min   Activity Tolerance Patient tolerated treatment well   Behavior During Therapy [facility] for tasks assessed/performed      Past Medical History:  Diagnosis Date  . Arthritis    "knees" (04/22/2012)  . Bronchitis   . Chronic bronchitis ([place])    "yearly; when the weather changes" (04/22/2012)  . Chronic kidney disease   . Colon polyps    adenomatous and hyperplastic-  . Constipation   . Depression   . Eczema   . Epilepsy ([place])    "been having them right often here lately" (04/22/2012)  . Fatty liver   . [id number])    "1/wk" (04/22/2012)  . High cholesterol   . History of kidney stones   . Osteoarthritis    [name] 04/22/2012  . Other convulsions ([place]) 05/21/12   non-epileptic spells  . Rectal bleed    in toilet- bright red  . Restless leg   . Seizures ([place])   . Vertigo     Past Surgical History:  Procedure Laterality Date  . ABDOMINAL HYSTERECTOMY  2001  . BLADDER SUSPENSION    . BUNIONECTOMY Left 2000  . CESAREAN SECTION  1987; 1988  . COLONOSCOPY    . JOINT REPLACEMENT    . MASS EXCISION  10/22/2011   Procedure: EXCISION MASS;  Surgeon: [name], MD;  Location: [facility];  Service: General;  Laterality: Right;  excision right buttock mass  . TOTAL HIP ARTHROPLASTY Left 1993; 1995; 2000  . TOTAL KNEE ARTHROPLASTY Left 05/07/2015   Procedure: TOTAL LEFT KNEE ARTHROPLASTY;  Surgeon: [name], MD;  Location: [facility];  Service: Orthopedics;  Laterality:  Left;  . TOTAL KNEE ARTHROPLASTY Right 10/01/2015   Procedure: RIGHT TOTAL KNEE ARTHROPLASTY;  Surgeon: [name], MD;  Location: [facility];  Service: Orthopedics;  Laterality: Right;    There were no vitals filed for this visit.       Subjective Assessment - 10/22/15 1210    Subjective The patient underwent a right total knee replacement on 10/01/15.  She had 3 home health visits and did well.  She states she was shaving her legs and knicked her incision.  It is covered with a sterile bandaid.     Patient Stated Goals Just get back to normal.   Currently in Pain? Yes   Pain Score 6    Pain Location Knee   Pain Orientation Right   Pain Descriptors / Indicators Aching   Pain Type Surgical pain   Pain Onset 1 to 4 weeks ago   Pain Frequency Constant   Aggravating Factors  Bending and sitting too long.Marland Kitchen"I get stiff."            [facility] PT Assessment - 10/22/15 0001      Assessment   Medical Diagnosis Right total knee replacement.   Referring Provider [name] MD.   Onset Date/Surgical Date --  10/01/15 (surgery date).   Next MD Visit --  11/13/15.     Precautions   Precautions --  No ultrasound.  Seizure disorder.     Restrictions   Weight Bearing Restrictions No     Balance Screen   Has the patient fallen in the past 6 months Yes   How many times? --  1.   Has the patient had a decrease in activity level because of a fear of falling?  No   Is the patient reluctant to leave their home because of a fear of falling?  No     Home Environment   Living Environment Private residence     Prior Function   Level of Independence Independent     Observation/Other Assessments   Observations Bandaid over right anterior incisional site due to knicking with razor while shaving legs.     Observation/Other Assessments-Edema    Edema --  RT 3 CMS > LT.     ROM / Strength   AROM / PROM / Strength AROM;Strength     AROM   Overall AROM Comments -10 degrees to 100 dgerees  of active right knee flexion and passive to 105 degrees.     Strength   Overall Strength Comments Right hip and knee strength= 4+/5.     Palpation   Palpation comment Tender to palpation around the patient's right anterior knee with a oderate decrease in patellar mobility in all directions.     Ambulation/Gait   Gait Comments Essentially normal.                   [facility] Adult PT Treatment/Exercise - 10/22/15 0001      Exercises   Exercises Knee/Hip     Knee/Hip Exercises: Aerobic   Nustep Leve 3 x 15 minutes.     Modalities   Modalities Vasopneumatic     Vasopneumatic   Number Minutes Vasopneumatic  15 minutes   Vasopnuematic Location  --  RT KNEE.   Vasopneumatic Pressure Medium                  PT Short Term Goals - 10/22/15 1304      PT SHORT TERM GOAL #1   Title Ind with a initial HEP.   Time 4   Period Weeks   Status New           PT Long Term Goals - 10/22/15 1305      PT LONG TERM GOAL #1   Title Ind with an advanced HEP.   Time 8   Period Weeks   Status New     PT LONG TERM GOAL #2   Title Decrease edema to within 2 cms of contralateral side to assist with range of motion gains and decrease pain.   Time 8   Period Weeks   Status New     PT LONG TERM GOAL #3   Title Active right knee flexion to 115 degrees+ so the patient can perform functional tasks and do so with pain not > 2-3/10.   Time 8   Period Weeks   Status New     PT LONG TERM GOAL #4   Title Perform a reciprocating stair gait with one railing with pain not > 2-3/10.   Time 8   Period Weeks   Status New     PT LONG TERM GOAL #5   Title Perform ADL's with pain not > 3/10.   Time 8   Period Weeks   Status New  Plan - 10/22/15 1227    Clinical Impression Statement The patient's bilateral knee extension is equal.  She states her knees have never been in full extension when lying on her back.  Her flexion activiely islimited to 100  degrees but she is doing great.  She has some palpable anterior knee pain.  She has an expected amount of edema also.   Rehab Potential Excellent   PT Frequency 2x / week   PT Duration 8 weeks   PT Treatment/Interventions ADLs/Self Care Home Management;Cryotherapy;Electrical Stimulation;Therapeutic activities;Therapeutic exercise;Patient/family education;Passive range of motion;Manual techniques   PT Next Visit Plan Total knee replacement protocol.  No ultrasound.  Vasopneumatic and electrical stimulation.      Patient will benefit from skilled therapeutic intervention in order to improve the following deficits and impairments:  Pain, Decreased activity tolerance, Decreased range of motion, Increased edema  Visit Diagnosis: Acute pain of right knee - Plan: PT plan of care cert/re-cert  Stiffness of right knee, not elsewhere classified - Plan: PT plan of care cert/re-cert  Localized edema - Plan: PT plan of care cert/re-cert      G-Codes - [id number]    Functional Assessment Tool Used 58% limitation.   Functional Limitation Mobility: Walking and moving around   Mobility: Walking and Moving Around Current Status [phone]) At least 40 percent but less than 60 percent impaired, limited or restricted   Mobility: Walking and Moving Around Goal Status [phone]) At least 20 percent but less than 40 percent impaired, limited or restricted       Problem List Patient Active Problem List   Diagnosis Date Noted  . Partial symptomatic epilepsy with complex partial seizures, not intractable, with status epilepticus ([place]) 07/24/2015  . Insomnia w/ sleep apnea 07/24/2015  . OA (osteoarthritis) of knee 05/07/2015  . Memory loss 02/20/2014  . Moderate major depression ([place]) 10/14/2010    Aysel Gilchrest, [place] MPT 10/22/2015, 1:09 PM  [facility] [address] [place], [place], [address] Phone: [phone]   Fax:  [phone]  Name: [name] MRN:  [id number] Date of Birth: 26-May-1965

## 2015-10-22 NOTE — Progress Notes (Signed)
Subjective:  Patient ID: [name], female    DOB: 1965/04/18  Age: 50 y.o. MRN: [id number]  CC: Medication Problem (pt here today to discuss taking Seroquel, she hasn't had it filled yet as she is worried about possible side effects)   HPI [name] presents for Has not taken the Seroquel. She declines it at the current time. However she is taking Lyrica. She started it 3 weeks ago. About a week ago she started seeing double. She is concerned that this may be a side effect Lyrica. Lyrica medical profile review reveals a possibility of blurred vision no diplopia mentioned.There is not been any headache. No fever chills or sweats. The vision is clear but double. It is gradually worsening. Patient plans to discontinue the Lyrica   History [name] has a past medical history of Arthritis; Bronchitis; Chronic bronchitis ([place]); Chronic kidney disease; Colon polyps; Constipation; Depression; Eczema; Epilepsy ([place]); Fatty liver; Headache(784.0); High cholesterol; History of kidney stones; Osteoarthritis; Other convulsions (05/21/12); Rectal bleed; Restless leg; Seizures ([place]); and Vertigo.   She has a past surgical history that includes Total hip arthroplasty (Left, 1993; 1995; 2000); Abdominal hysterectomy (2001); Colonoscopy; Joint replacement; Mass excision (10/22/2011); Bladder suspension; Cesarean section [id number]; 1988); Bunionectomy (Left, 2000); Total knee arthroplasty (Left, 05/07/2015); and Total knee arthroplasty (Right, 10/01/2015).   Her family history includes Cancer in her brother, maternal aunt, and mother; Diabetes in her brother and maternal aunt.She reports that she has never smoked. She has never used smokeless tobacco. She reports that she does not drink alcohol or use drugs.    ROS Review of Systems  Constitutional: Negative for activity change, appetite change and fever.  HENT: Negative for congestion, rhinorrhea and sore throat.   Eyes: Negative for photophobia, pain,  discharge, redness, itching and visual disturbance.  Respiratory: Negative for cough and shortness of breath.   Cardiovascular: Negative for chest pain and palpitations.  Gastrointestinal: Negative for abdominal pain, diarrhea and nausea.  Genitourinary: Negative for dysuria.  Musculoskeletal: Negative for arthralgias and myalgias.    Objective:  BP 98/67   Pulse 89   Temp 98.2 F (36.8 C) (Oral)   Ht 5' (1.524 m)   Wt 162 lb (73.5 kg)   BMI 31.64 kg/m   BP Readings from Last 3 Encounters:  10/22/15 98/67  10/03/15 96/69  09/25/15 97/73    Wt Readings from Last 3 Encounters:  10/22/15 162 lb (73.5 kg)  10/01/15 174 lb (78.9 kg)  09/25/15 171 lb (77.6 kg)     Physical Exam  Constitutional: She is oriented to person, place, and time. She appears well-developed and well-nourished. No distress.  HENT:  Head: Normocephalic and atraumatic.  Right Ear: External ear normal.  Left Ear: External ear normal.  Nose: Nose normal.  Mouth/Throat: Oropharynx is clear and moist.  Eyes: Conjunctivae and EOM are normal. Pupils are equal, round, and reactive to light.  Neck: Normal range of motion. Neck supple. No thyromegaly present.  Cardiovascular: Normal rate, regular rhythm and normal heart sounds.   No murmur heard. Pulmonary/Chest: Effort normal and breath sounds normal. No respiratory distress. She has no wheezes. She has no rales.  Abdominal: Soft. Bowel sounds are normal. She exhibits no distension. There is no tenderness.  Lymphadenopathy:    She has no cervical adenopathy.  Neurological: She is alert and oriented to person, place, and time. She has normal reflexes.  Skin: Skin is warm and dry.  Psychiatric: She has a normal mood and affect. Her  behavior is normal. Judgment and thought content normal.     Lab Results  Component Value Date   WBC 16.6 (H) 10/03/2015   HGB 11.4 (L) 10/03/2015   HCT 34.7 (L) 10/03/2015   PLT 269 10/03/2015   GLUCOSE 125 (H) 10/03/2015    CHOL 217 (H) 04/09/2015   TRIG 156 (H) 04/09/2015   HDL 50 04/09/2015   LDLCALC 136 (H) 04/09/2015   ALT 68 (H) 09/25/2015   AST 65 (H) 09/25/2015   NA 136 10/03/2015   K 3.4 (L) 10/03/2015   CL 99 (L) 10/03/2015   CREATININE 0.57 10/03/2015   BUN 12 10/03/2015   CO2 29 10/03/2015   TSH 0.661 01/31/2015   INR 1.01 09/25/2015   HGBA1C 5.4 09/08/2013    No results found.  Assessment & Plan:   [name] was seen today for medication problem.  Diagnoses and all orders for this visit:  Diplopia -     Ambulatory referral to Ophthalmology    Lyrica may cause some blurred vision. However patient reports diplopia. Will refer to ophthalmology if workup is negative will consider neurology.  I am having [name] maintain her citalopram, lamoTRIgine, levETIRAcetam, clonazePAM, cyclobenzaprine, HYDROmorphone, hydrOXYzine, ondansetron, rivaroxaban, and pramipexole.  No orders of the defined types were placed in this encounter.    Follow-up: Return in about 3 months (around 01/22/2016).  [name], M.D.

## 2015-10-23 ENCOUNTER — Telehealth: Payer: Self-pay | Admitting: Family Medicine

## 2015-10-23 ENCOUNTER — Other Ambulatory Visit: Payer: Self-pay | Admitting: Family Medicine

## 2015-10-23 MED ORDER — GABAPENTIN 600 MG PO TABS: 600.0000 mg | ORAL_TABLET | Freq: Three times a day (TID) | ORAL | 2 refills | Status: DC

## 2015-10-23 NOTE — Telephone Encounter (Signed)
Patient would like to start back taking Neurontin

## 2015-10-23 NOTE — Telephone Encounter (Signed)
I sent in the requested prescription 

## 2015-10-24 NOTE — Telephone Encounter (Signed)
Patient aware, script was sent in. 

## 2015-10-25 ENCOUNTER — Ambulatory Visit: Payer: PPO | Admitting: Physical Therapy

## 2015-10-25 ENCOUNTER — Encounter: Payer: Self-pay | Admitting: Physical Therapy

## 2015-10-25 DIAGNOSIS — R600 Localized edema: Secondary | ICD-10-CM

## 2015-10-25 DIAGNOSIS — M25661 Stiffness of right knee, not elsewhere classified: Secondary | ICD-10-CM

## 2015-10-25 DIAGNOSIS — M25561 Pain in right knee: Secondary | ICD-10-CM

## 2015-10-25 NOTE — Patient Instructions (Signed)
    Knee Flexion Stretch on Step  Place foot on step and lean forward until you feel a good stretch in front of knee.   hold 30 sec x 5-10 perform 2-4 x daily   Strengthening: Hip Abduction (Side-Lying)  Strengthening: Straight Leg Raise (Phase 1)  Repeat _10___ times per set. Do __2__ sets per session. Do __2__ sessions per day.     Straight Leg Raise   Tighten stomach and slowly raise locked right leg __4__ inches from floor. Repeat __10-30__ times per set. Do __2__ sets per session. Do __2__ sessions per day.                 

## 2015-10-25 NOTE — Therapy (Signed)
[facility] [place], [place], [address] Phone: [phone]   Fax:  [phone]  Physical Therapy Treatment  Patient Details  Name: [name] MRN: [id number] Date of Birth: 02/07/1965 Referring Provider: [name] MD.  Encounter Date: 10/25/2015      PT End of Session - 10/25/15 1023    Visit Number 2   Number of Visits 12   Date for PT Re-Evaluation 12/21/15   PT Start Time 0946   PT Stop Time 1042   PT Time Calculation (min) 56 min   Activity Tolerance Patient tolerated treatment well   Behavior During Therapy [facility] for tasks assessed/performed      Past Medical History:  Diagnosis Date  . Arthritis    "knees" (04/22/2012)  . Bronchitis   . Chronic bronchitis ([place])    "yearly; when the weather changes" (04/22/2012)  . Chronic kidney disease   . Colon polyps    adenomatous and hyperplastic-  . Constipation   . Depression   . Eczema   . Epilepsy ([place])    "been having them right often here lately" (04/22/2012)  . Fatty liver   . FOYDXAJO(878.6)    "1/wk" (04/22/2012)  . High cholesterol   . History of kidney stones   . Osteoarthritis    [name] 04/22/2012  . Other convulsions 05/21/12   non-epileptic spells  . Rectal bleed    in toilet- bright red  . Restless leg   . Seizures ([place])   . Vertigo     Past Surgical History:  Procedure Laterality Date  . ABDOMINAL HYSTERECTOMY  2001  . BLADDER SUSPENSION    . BUNIONECTOMY Left 2000  . CESAREAN SECTION  1987; 1988  . COLONOSCOPY    . JOINT REPLACEMENT    . MASS EXCISION  10/22/2011   Procedure: EXCISION MASS;  Surgeon: [name], MD;  Location: [facility];  Service: General;  Laterality: Right;  excision right buttock mass  . TOTAL HIP ARTHROPLASTY Left 1993; 1995; 2000  . TOTAL KNEE ARTHROPLASTY Left 05/07/2015   Procedure: TOTAL LEFT KNEE ARTHROPLASTY;  Surgeon: [name], MD;  [facility];  Service: Orthopedics;  Laterality: Left;  .  TOTAL KNEE ARTHROPLASTY Right 10/01/2015   Procedure: RIGHT TOTAL KNEE ARTHROPLASTY;  Surgeon: [name], MD;  [facility];  Service: Orthopedics;  Laterality: Right;    There were no vitals filed for this visit.      Subjective Assessment - 10/25/15 0955    Subjective Patient reported doing well thus far with some stiffness and soreness in knee   Patient Stated Goals Just get back to normal.   Currently in Pain? Yes   Pain Score 5    Pain Location Knee   Pain Orientation Right   Pain Descriptors / Indicators Aching   Pain Type Surgical pain   Pain Onset 1 to 4 weeks ago   Pain Frequency Constant   Aggravating Factors  sitting too long or bending   Pain Relieving Factors movement             [facility] PT Assessment - 10/25/15 0001      ROM / Strength   AROM / PROM / Strength AROM;PROM     AROM   AROM Assessment Site Knee   Right/Left Knee Right   Right Knee Flexion 109     PROM   PROM Assessment Site Knee   Right/Left Shoulder --   Right/Left Knee Right   Right Knee  Flexion 117                     [facility] Adult PT Treatment/Exercise - 10/25/15 0001      Knee/Hip Exercises: Stretches   Active Hamstring Stretch Right;3 reps;30 seconds     Knee/Hip Exercises: Aerobic   Nustep 74mn L4 UE/LE     Knee/Hip Exercises: Standing   Forward Step Up Right;2 sets;10 reps;Step Height: 6";Hand Hold: 2   Rocker Board 3 minutes     Vasopneumatic   Number Minutes Vasopneumatic  15 minutes   Vasopnuematic Location  Knee   Vasopneumatic Pressure Medium     Manual Therapy   Manual Therapy Passive ROM   Passive ROM gentle PROM for right knee flexion and ext with gentle holds                PT Education - 10/25/15 1026    Education provided Yes   Education Details HEP   Person(s) Educated Patient   Methods Explanation;Demonstration;Handout   Comprehension Verbalized understanding;Returned demonstration          PT Short Term Goals -  10/25/15 1026      PT SHORT TERM GOAL #1   Title Ind with a initial HEP.   Time 4   Period Weeks   Status Achieved           PT Long Term Goals - 10/22/15 1305      PT LONG TERM GOAL #1   Title Ind with an advanced HEP.   Time 8   Period Weeks   Status New     PT LONG TERM GOAL #2   Title Decrease edema to within 2 cms of contralateral side to assist with range of motion gains and decrease pain.   Time 8   Period Weeks   Status New     PT LONG TERM GOAL #3   Title Active right knee flexion to 115 degrees+ so the patient can perform functional tasks and do so with pain not > 2-3/10.   Time 8   Period Weeks   Status New     PT LONG TERM GOAL #4   Title Perform a reciprocating stair gait with one railing with pain not > 2-3/10.   Time 8   Period Weeks   Status New     PT LONG TERM GOAL #5   Title Perform ADL's with pain not > 3/10.   Time 8   Period Weeks   Status New               Plan - 10/25/15 1026    Clinical Impression Statement Patient progressing well today with less pain overall reported and improved ROM for right knee. Patient was given HEP for ROM and strengthening and is independent with it today. Patient tolerated treatment well. Patient met STG #1 and LTG's ongoing due to ROM, strength and pain deficits.   Rehab Potential Excellent   PT Frequency 2x / week   PT Duration 8 weeks   PT Treatment/Interventions ADLs/Self Care Home Management;Cryotherapy;Electrical Stimulation;Therapeutic activities;Therapeutic exercise;Patient/family education;Passive range of motion;Manual techniques   PT Next Visit Plan Total knee replacement protocol.  No ultrasound.  Vasopneumatic and electrical stimulation.   Consulted and Agree with Plan of Care Patient      Patient will benefit from skilled therapeutic intervention in order to improve the following deficits and impairments:  Pain, Decreased activity tolerance, Decreased range of motion, Increased  edema  Visit Diagnosis:  Acute pain of right knee  Stiffness of right knee, not elsewhere classified  Localized edema     Problem List Patient Active Problem List   Diagnosis Date Noted  . Partial symptomatic epilepsy with complex partial seizures, not intractable, with status epilepticus ([place]) 07/24/2015  . Insomnia w/ sleep apnea 07/24/2015  . OA (osteoarthritis) of knee 05/07/2015  . Memory loss 02/20/2014  . Moderate major depression ([place]) 10/14/2010    Kristofor Michalowski P, PTA 10/25/2015, 10:42 AM  [facility] [place], [place], [address] Phone: [phone]   Fax:  [phone]  Name: [name] MRN: [id number] Date of Birth: 23-Oct-1965

## 2015-10-28 ENCOUNTER — Encounter: Payer: Self-pay | Admitting: Family Medicine

## 2015-10-30 ENCOUNTER — Other Ambulatory Visit: Payer: PPO | Admitting: Physician Assistant

## 2015-10-30 ENCOUNTER — Ambulatory Visit (INDEPENDENT_AMBULATORY_CARE_PROVIDER_SITE_OTHER): Payer: PPO

## 2015-10-30 DIAGNOSIS — Z23 Encounter for immunization: Secondary | ICD-10-CM | POA: Diagnosis not present

## 2015-10-31 ENCOUNTER — Encounter: Payer: Self-pay | Admitting: Physical Therapy

## 2015-10-31 ENCOUNTER — Ambulatory Visit: Payer: PPO | Admitting: Physical Therapy

## 2015-10-31 DIAGNOSIS — R600 Localized edema: Secondary | ICD-10-CM

## 2015-10-31 DIAGNOSIS — M25661 Stiffness of right knee, not elsewhere classified: Secondary | ICD-10-CM

## 2015-10-31 DIAGNOSIS — M25561 Pain in right knee: Secondary | ICD-10-CM

## 2015-10-31 NOTE — Therapy (Signed)
[facility] [place], [place], [address] Phone: [phone]   Fax:  [phone]  Physical Therapy Treatment  Patient Details  Name: [name] MRN: [id number] Date of Birth: Apr 01, 1965 Referring Provider: [name] MD.  Encounter Date: 10/31/2015      PT End of Session - 10/31/15 1058    Visit Number 3   Number of Visits 12   Date for PT Re-Evaluation 12/21/15   PT Start Time 1113   PT Stop Time 1200   PT Time Calculation (min) 47 min   Activity Tolerance Patient tolerated treatment well   Behavior During Therapy [facility] for tasks assessed/performed      Past Medical History:  Diagnosis Date  . Arthritis    "knees" (04/22/2012)  . Bronchitis   . Chronic bronchitis ([place])    "yearly; when the weather changes" (04/22/2012)  . Chronic kidney disease   . Colon polyps    adenomatous and hyperplastic-  . Constipation   . Depression   . Eczema   . Epilepsy ([place])    "been having them right often here lately" (04/22/2012)  . Fatty liver   . [id number])    "1/wk" (04/22/2012)  . High cholesterol   . History of kidney stones   . Osteoarthritis    [name] 04/22/2012  . Other convulsions 05/21/12   non-epileptic spells  . Rectal bleed    in toilet- bright red  . Restless leg   . Seizures ([place])   . Vertigo     Past Surgical History:  Procedure Laterality Date  . ABDOMINAL HYSTERECTOMY  2001  . BLADDER SUSPENSION    . BUNIONECTOMY Left 2000  . CESAREAN SECTION  1987; 1988  . COLONOSCOPY    . JOINT REPLACEMENT    . MASS EXCISION  10/22/2011   Procedure: EXCISION MASS;  Surgeon: [name], MD;  Location: [place];  Service: General;  Laterality: Right;  excision right buttock mass  . TOTAL HIP ARTHROPLASTY Left 1993; 1995; 2000  . TOTAL KNEE ARTHROPLASTY Left 05/07/2015   Procedure: TOTAL LEFT KNEE ARTHROPLASTY;  Surgeon: [name], MD;  Location[facility];  Service: Orthopedics;  Laterality: Left;  .  TOTAL KNEE ARTHROPLASTY Right 10/01/2015   Procedure: RIGHT TOTAL KNEE ARTHROPLASTY;  Surgeon: [name], MD;  Location: [facility];  Service: Orthopedics;  Laterality: Right;    There were no vitals filed for this visit.      Subjective Assessment - 10/31/15 1058    Subjective Reports that her knee gets stiff when she sits. Reports that her legs were bothering her last night as she was up on her feet a lot yesterday.   Patient Stated Goals Just get back to normal.   Currently in Pain? Yes   Pain Score 4    Pain Location Knee   Pain Orientation Right   Pain Descriptors / Indicators Sore   Pain Type Surgical pain   Pain Onset 1 to 4 weeks ago            [facility] PT Assessment - 10/31/15 0001      Assessment   Medical Diagnosis Right total knee replacement.   Onset Date/Surgical Date 10/01/15   Next MD Visit 11/13/2015     Restrictions   Weight Bearing Restrictions No     ROM / Strength   AROM / PROM / Strength AROM     AROM   Overall AROM  Deficits;Within functional limits for tasks performed  AROM Assessment Site Knee   Right/Left Knee Right   Right Knee Extension -12   Right Knee Flexion 116                     [facility] Adult PT Treatment/Exercise - 10/31/15 0001      Knee/Hip Exercises: Stretches   Active Hamstring Stretch Right;5 reps;10 seconds     Knee/Hip Exercises: Aerobic   Nustep L4, seat 7 x10 min     Knee/Hip Exercises: Standing   Hip Flexion Stengthening;Right;1 set;15 reps;Knee bent   Hip Flexion Limitations 3#   Forward Lunges Right;10 reps   Hip Abduction Stengthening;Right;1 set;15 reps;Knee straight   Abduction Limitations 3#   Forward Step Up Right;2 sets;10 reps;Step Height: 6";Hand Hold: 2   Rocker Board 2 minutes     Knee/Hip Exercises: Seated   Long Arc Quad Strengthening;Right;2 sets;10 reps;Weights   Long Arc Quad Weight 4 lbs.     Modalities   Modalities Associate Professor Action IFC   Electrical Stimulation Parameters 1-10 hz x15 min   Electrical Stimulation Goals Pain;Edema     Vasopneumatic   Number Minutes Vasopneumatic  15 minutes   Vasopnuematic Location  Knee   Vasopneumatic Pressure Medium   Vasopneumatic Temperature  63     Manual Therapy   Manual Therapy Passive ROM;Soft tissue mobilization   Soft tissue mobilization R knee incision and patellar mobilizations (sup/inf, lat/med) to promote proper mobility   Passive ROM PROM of R knee into flex/ext with gentle holds at end range                  PT Short Term Goals - 10/25/15 1026      PT SHORT TERM GOAL #1   Title Ind with a initial HEP.   Time 4   Period Weeks   Status Achieved           PT Long Term Goals - 10/31/15 1147      PT LONG TERM GOAL #1   Title Ind with an advanced HEP.   Time 8   Period Weeks   Status On-going     PT LONG TERM GOAL #2   Title Decrease edema to within 2 cms of contralateral side to assist with range of motion gains and decrease pain.   Time 8   Period Weeks   Status On-going     PT LONG TERM GOAL #3   Title Active right knee flexion to 115 degrees+ so the patient can perform functional tasks and do so with pain not > 2-3/10.   Time 8   Period Weeks   Status Achieved  AROM R knee flexion 116 deg 10/31/2015     PT LONG TERM GOAL #4   Title Perform a reciprocating stair gait with one railing with pain not > 2-3/10.   Time 8   Period Weeks   Status On-going     PT LONG TERM GOAL #5   Title Perform ADL's with pain not > 3/10.   Time 8   Period Weeks   Status On-going               Plan - 10/31/15 1117    Clinical Impression Statement Patient progressing well for three weeks post TKR. Patient arrived with mid level R knee soreness but able to tolerate all therapeutic exercises with only report of discomfort  with standing R hip/knee flexion with 3#. Firm  end feel noted with PROM of R knee into flexion and extension. Good patella mobility with only slight limitation in sup/inf direction. Fairly good R incision mobility noted during mobilizations to promte proper mobility. Patient reported soreness with PROM of R knee today in supine. AROM R knee measured as 12-116 deg in supine thus meeting LTG regarding R knee flexion. Normal modalities response noted following removal of the modalities after stimulation output decreased secondary to discomfort. Patient experienced R knee stiffness upon end of treatment.   Rehab Potential Excellent   PT Frequency 2x / week   PT Duration 8 weeks   PT Treatment/Interventions ADLs/Self Care Home Management;Cryotherapy;Electrical Stimulation;Therapeutic activities;Therapeutic exercise;Patient/family education;Passive range of motion;Manual techniques   PT Next Visit Plan Continue with R TKR protocol with modalities PRN for pain/edema per MPT POC.   Consulted and Agree with Plan of Care Patient      Patient will benefit from skilled therapeutic intervention in order to improve the following deficits and impairments:  Pain, Decreased activity tolerance, Decreased range of motion, Increased edema  Visit Diagnosis: Acute pain of right knee  Stiffness of right knee, not elsewhere classified  Localized edema     Problem List Patient Active Problem List   Diagnosis Date Noted  . Partial symptomatic epilepsy with complex partial seizures, not intractable, with status epilepticus ([place]) 07/24/2015  . Insomnia w/ sleep apnea 07/24/2015  . OA (osteoarthritis) of knee 05/07/2015  . Memory loss 02/20/2014  . Moderate major depression ([place]) 10/14/2010    Wynelle Fanny, PTA 10/31/2015, 12:04 PM  [facility] [address] [place], [place], [address] Phone: [phone]   Fax:  [phone]  Name: [name] MRN: [id number] Date of Birth: 04-26-1965

## 2015-11-02 ENCOUNTER — Ambulatory Visit: Payer: PPO | Admitting: Physical Therapy

## 2015-11-02 DIAGNOSIS — M25661 Stiffness of right knee, not elsewhere classified: Secondary | ICD-10-CM

## 2015-11-02 DIAGNOSIS — R600 Localized edema: Secondary | ICD-10-CM

## 2015-11-02 DIAGNOSIS — M25561 Pain in right knee: Secondary | ICD-10-CM

## 2015-11-02 NOTE — Therapy (Signed)
[facility] [place], [place], [address] Phone: [phone]   Fax:  [phone]  Physical Therapy Treatment  Patient Details  Name: [name] MRN: [id number] Date of Birth: 09-22-1965 Referring Provider: [name] MD.  Encounter Date: 11/02/2015      PT End of Session - 11/02/15 1040    Visit Number 5   Number of Visits 12   Date for PT Re-Evaluation 12/21/15      Past Medical History:  Diagnosis Date  . Arthritis    "knees" (04/22/2012)  . Bronchitis   . Chronic bronchitis ([place])    "yearly; when the weather changes" (04/22/2012)  . Chronic kidney disease   . Colon polyps    adenomatous and hyperplastic-  . Constipation   . Depression   . Eczema   . Epilepsy ([place])    "been having them right often here lately" (04/22/2012)  . Fatty liver   . [id number])    "1/wk" (04/22/2012)  . High cholesterol   . History of kidney stones   . Osteoarthritis    [name] 04/22/2012  . Other convulsions 05/21/12   non-epileptic spells  . Rectal bleed    in toilet- bright red  . Restless leg   . Seizures ([place])   . Vertigo     Past Surgical History:  Procedure Laterality Date  . ABDOMINAL HYSTERECTOMY  2001  . BLADDER SUSPENSION    . BUNIONECTOMY Left 2000  . CESAREAN SECTION  1987; 1988  . COLONOSCOPY    . JOINT REPLACEMENT    . MASS EXCISION  10/22/2011   Procedure: EXCISION MASS;  Surgeon: [name], MD;  Location: [place];  Service: General;  Laterality: Right;  excision right buttock mass  . TOTAL HIP ARTHROPLASTY Left 1993; 1995; 2000  . TOTAL KNEE ARTHROPLASTY Left 05/07/2015   Procedure: TOTAL LEFT KNEE ARTHROPLASTY;  Surgeon: [name], MD;  [facility];  Service: Orthopedics;  Laterality: Left;  . TOTAL KNEE ARTHROPLASTY Right 10/01/2015   Procedure: RIGHT TOTAL KNEE ARTHROPLASTY;  Surgeon: [name], MD;  [facility];  Service: Orthopedics;  Laterality: Right;    There were  no vitals filed for this visit.      Subjective Assessment - 11/02/15 0943    Subjective My knee feels pretty good today.  My pain-level is around a 2/10 today.   Patient Stated Goals Just get back to normal.   Pain Score 2    Pain Location Knee   Pain Orientation Right   Pain Descriptors / Indicators Sore   Pain Type Surgical pain   Pain Onset 1 to 4 weeks ago            [facility] PT Assessment - 11/02/15 0001      AROM   Right Knee Extension --  Bilateral knee extension is equal.   Right Knee Flexion --  RT knee flexion= 125 degrees.                     [place] Adult PT Treatment/Exercise - 11/02/15 0001      Knee/Hip Exercises: Aerobic   Nustep Level 4 x 15 minutes.     Knee/Hip Exercises: Standing   Rocker Board Limitations 5 minutes.   Other Standing Knee Exercises 14" box lunges x 5 minutes      Knee/Hip Exercises: Supine   Short Arc Quad Sets Limitations 7 1/2# x 6 minutes.     Printmaker  Electrical Stimulation Location RT knee.   Electrical Stimulation Action IFC x 15 minutes.   Electrical Stimulation Goals Edema;Pain     Vasopneumatic   Number Minutes Vasopneumatic  15 minutes   Vasopnuematic Location  --  RT Knee.   Vasopneumatic Pressure --  Medium.     Manual Therapy   Manual Therapy Passive ROM   Passive ROM PROM into right knee flexion x 7 minutes.                  PT Short Term Goals - 10/25/15 1026      PT SHORT TERM GOAL #1   Title Ind with a initial HEP.   Time 4   Period Weeks   Status Achieved           PT Long Term Goals - 11/02/15 1125      PT LONG TERM GOAL #1   Title Ind with an advanced HEP.   Time 8   Period Weeks   Status On-going     PT LONG TERM GOAL #2   Title Decrease edema to within 2 cms of contralateral side to assist with range of motion gains and decrease pain.   Time 8   Period Weeks   Status On-going     PT LONG TERM GOAL #3   Title Active right knee flexion to  115 degrees+ so the patient can perform functional tasks and do so with pain not > 2-3/10.   Period Weeks   Status Achieved     PT LONG TERM GOAL #4   Title Perform a reciprocating stair gait with one railing with pain not > 2-3/10.   Time 8   Period Weeks   Status On-going     PT LONG TERM GOAL #5   Title Perform ADL's with pain not > 3/10.   Time 8   Period Weeks   Status On-going               Plan - 11/02/15 1042    Clinical Impression Statement The patient is making excellent progress.  Her pain is a low 2/10 today.      Patient will benefit from skilled therapeutic intervention in order to improve the following deficits and impairments:     Visit Diagnosis: Acute pain of right knee  Stiffness of right knee, not elsewhere classified  Localized edema     Problem List Patient Active Problem List   Diagnosis Date Noted  . Partial symptomatic epilepsy with complex partial seizures, not intractable, with status epilepticus ([place]) 07/24/2015  . Insomnia w/ sleep apnea 07/24/2015  . OA (osteoarthritis) of knee 05/07/2015  . Memory loss 02/20/2014  . Moderate major depression ([place]) 10/14/2010    Kandis Henry, [place] MPT 11/02/2015, 11:29 AM  [facility] [address] [place], [place], [address] Phone: [phone]   Fax:  [phone]  Name: [name] MRN: [id number] Date of Birth: [birth year]

## 2015-11-05 ENCOUNTER — Telehealth: Payer: Self-pay | Admitting: Family Medicine

## 2015-11-05 ENCOUNTER — Ambulatory Visit: Payer: PPO | Admitting: Physical Therapy

## 2015-11-05 ENCOUNTER — Encounter: Payer: Self-pay | Admitting: Physical Therapy

## 2015-11-05 DIAGNOSIS — F329 Major depressive disorder, single episode, unspecified: Secondary | ICD-10-CM

## 2015-11-05 DIAGNOSIS — M25561 Pain in right knee: Secondary | ICD-10-CM

## 2015-11-05 DIAGNOSIS — M25661 Stiffness of right knee, not elsewhere classified: Secondary | ICD-10-CM

## 2015-11-05 DIAGNOSIS — R600 Localized edema: Secondary | ICD-10-CM

## 2015-11-05 DIAGNOSIS — F32A Depression, unspecified: Secondary | ICD-10-CM

## 2015-11-05 NOTE — Therapy (Signed)
[facility] [place], [place], [address] Phone: [phone]   Fax:  [phone]  Physical Therapy Treatment  Patient Details  Name: [name] MRN: [id number] Date of Birth: Dec 25, 1965 Referring Provider: [name] MD.  Encounter Date: 11/05/2015      PT End of Session - 11/05/15 1357    Visit Number 6   Number of Visits 12   Date for PT Re-Evaluation 12/21/15   PT Start Time 1316   PT Stop Time 1414   PT Time Calculation (min) 58 min   Activity Tolerance Patient tolerated treatment well   Behavior During Therapy [facility] for tasks assessed/performed      Past Medical History:  Diagnosis Date  . Arthritis    "knees" (04/22/2012)  . Bronchitis   . Chronic bronchitis ([place])    "yearly; when the weather changes" (04/22/2012)  . Chronic kidney disease   . Colon polyps    adenomatous and hyperplastic-  . Constipation   . Depression   . Eczema   . Epilepsy ([place])    "been having them right often here lately" (04/22/2012)  . Fatty liver   . [id number])    "1/wk" (04/22/2012)  . High cholesterol   . History of kidney stones   . Osteoarthritis    [name] 04/22/2012  . Other convulsions 05/21/12   non-epileptic spells  . Rectal bleed    in toilet- bright red  . Restless leg   . Seizures ([place])   . Vertigo     Past Surgical History:  Procedure Laterality Date  . ABDOMINAL HYSTERECTOMY  2001  . BLADDER SUSPENSION    . BUNIONECTOMY Left 2000  . CESAREAN SECTION  1987; 1988  . COLONOSCOPY    . JOINT REPLACEMENT    . MASS EXCISION  10/22/2011   Procedure: EXCISION MASS;  Surgeon: [name], MD;  Location: [place];  Service: General;  Laterality: Right;  excision right buttock mass  . TOTAL HIP ARTHROPLASTY Left 1993; 1995; 2000  . TOTAL KNEE ARTHROPLASTY Left 05/07/2015   Procedure: TOTAL LEFT KNEE ARTHROPLASTY;  Surgeon: [name], MD;  [facility];  Service: Orthopedics;  Laterality: Left;  .  TOTAL KNEE ARTHROPLASTY Right 10/01/2015   Procedure: RIGHT TOTAL KNEE ARTHROPLASTY;  Surgeon: [name], MD;  [facility];  Service: Orthopedics;  Laterality: Right;    There were no vitals filed for this visit.      Subjective Assessment - 11/05/15 1329    Subjective Patient reported feeling good overall with some ongoing soreness   Patient Stated Goals Just get back to normal.   Currently in Pain? Yes   Pain Score 3    Pain Location Knee   Pain Orientation Right   Pain Descriptors / Indicators Sore   Pain Type Surgical pain   Pain Onset More than a month ago   Pain Frequency Intermittent   Aggravating Factors  prolong sitting or bending knee   Pain Relieving Factors movement                          [facility] Adult PT Treatment/Exercise - 11/05/15 0001      Knee/Hip Exercises: Aerobic   Nustep Level 4 x 15 minutes, monitored for progression     Knee/Hip Exercises: Standing   Hip Flexion Stengthening;Right;15 reps   Hip Flexion Limitations 3#   Hip Abduction Stengthening;Right;2 sets;10 reps   Abduction Limitations 3#   Lateral  Step Up Right;Step Height: 6"  x10, x15   Forward Step Up Right;10 reps;Step Height: 6";Hand Hold: 2;3 sets     Knee/Hip Exercises: Seated   Long Arc Quad Strengthening;Right;10 reps;Weights;3 sets   [employer] Weight 4 lbs.     Knee/Hip Exercises: Supine   Straight Leg Raise with External Rotation Strengthening;Right;3 sets;10 reps     Knee/Hip Exercises: Sidelying   Hip ABduction Strengthening;Right;2 sets;10 reps     Electrical Stimulation   Electrical Stimulation Location RT knee.   Electrical Stimulation Action IFC   Electrical Stimulation Parameters 1-10hx x6min   Electrical Stimulation Goals Edema;Pain     Vasopneumatic   Number Minutes Vasopneumatic  15 minutes   Vasopnuematic Location  Knee   Vasopneumatic Pressure Medium                  PT Short Term Goals - 10/25/15 1026      PT  SHORT TERM GOAL #1   Title Ind with a initial HEP.   Time 4   Period Weeks   Status Achieved           PT Long Term Goals - 11/02/15 1125      PT LONG TERM GOAL #1   Title Ind with an advanced HEP.   Time 8   Period Weeks   Status On-going     PT LONG TERM GOAL #2   Title Decrease edema to within 2 cms of contralateral side to assist with range of motion gains and decrease pain.   Time 8   Period Weeks   Status On-going     PT LONG TERM GOAL #3   Title Active right knee flexion to 115 degrees+ so the patient can perform functional tasks and do so with pain not > 2-3/10.   Period Weeks   Status Achieved     PT LONG TERM GOAL #4   Title Perform a reciprocating stair gait with one railing with pain not > 2-3/10.   Time 8   Period Weeks   Status On-going     PT LONG TERM GOAL #5   Title Perform ADL's with pain not > 3/10.   Time 8   Period Weeks   Status On-going               Plan - 11/05/15 1409    Clinical Impression Statement Patient progressing overall with exercises and no increased pain reported during or post treatment. Patient has increased pain over 3/10 with ADL's. Patient current goals ongoing due to pain and edema deficits.   Rehab Potential Excellent   PT Frequency 2x / week   PT Duration 8 weeks   PT Next Visit Plan Continue with R TKR protocol with modalities PRN for pain/edema per MPT POC.   Consulted and Agree with Plan of Care Patient      Patient will benefit from skilled therapeutic intervention in order to improve the following deficits and impairments:  Pain, Decreased activity tolerance, Decreased range of motion, Increased edema  Visit Diagnosis: Acute pain of right knee  Stiffness of right knee, not elsewhere classified  Localized edema     Problem List Patient Active Problem List   Diagnosis Date Noted  . Partial symptomatic epilepsy with complex partial seizures, not intractable, with status epilepticus ([place])  07/24/2015  . Insomnia w/ sleep apnea 07/24/2015  . OA (osteoarthritis) of knee 05/07/2015  . Memory loss 02/20/2014  . Moderate major depression ([place]) 10/14/2010  Phillips Climes, PTA 11/05/2015, 2:38 PM  [facility] [place], [place], [address] Phone: [phone]   Fax:  [phone]  Name: [name] MRN: [id number] Date of Birth: 1965-06-24

## 2015-11-05 NOTE — Telephone Encounter (Signed)
Please refer as requested 

## 2015-11-05 NOTE — Telephone Encounter (Signed)
Please address

## 2015-11-06 NOTE — Telephone Encounter (Signed)
Spoke with patient, per her this appointment is to see a doctor at [facility] that treats depression.  Referral was sent.

## 2015-11-09 ENCOUNTER — Other Ambulatory Visit: Payer: Self-pay | Admitting: Family Medicine

## 2015-11-09 ENCOUNTER — Encounter: Payer: Self-pay | Admitting: Physical Therapy

## 2015-11-09 ENCOUNTER — Ambulatory Visit: Payer: PPO | Admitting: Physical Therapy

## 2015-11-09 DIAGNOSIS — M25561 Pain in right knee: Secondary | ICD-10-CM | POA: Diagnosis not present

## 2015-11-09 DIAGNOSIS — R600 Localized edema: Secondary | ICD-10-CM

## 2015-11-09 DIAGNOSIS — M25661 Stiffness of right knee, not elsewhere classified: Secondary | ICD-10-CM

## 2015-11-09 NOTE — Therapy (Signed)
[facility] [address] [place], [place], [address] Phone: [phone]   Fax:  [phone]  Physical Therapy Treatment  Patient Details  Name: [name] MRN: [id number] Date of Birth: 15-Jul-1965 Referring Provider: Ollen Gross MD.  Encounter Date: 11/09/2015      PT End of Session - 11/09/15 1114    Visit Number 7   Number of Visits 12   Date for PT Re-Evaluation 12/21/15   PT Start Time 1116   PT Stop Time 1200   PT Time Calculation (min) 44 min   Activity Tolerance Patient tolerated treatment well   Behavior During Therapy [facility] for tasks assessed/performed      Past Medical History:  Diagnosis Date  . Arthritis    "knees" (04/22/2012)  . Bronchitis   . Chronic bronchitis (HCC)    "yearly; when the weather changes" (04/22/2012)  . Chronic kidney disease   . Colon polyps    adenomatous and hyperplastic-  . Constipation   . Depression   . Eczema   . Epilepsy (HCC)    "been having them right often here lately" (04/22/2012)  . Fatty liver   . SAZLWBII(815.8)    "1/wk" (04/22/2012)  . High cholesterol   . History of kidney stones   . Osteoarthritis    Hattie Perch 04/22/2012  . Other convulsions 05/21/12   non-epileptic spells  . Rectal bleed    in toilet- bright red  . Restless leg   . Seizures (HCC)   . Vertigo     Past Surgical History:  Procedure Laterality Date  . ABDOMINAL HYSTERECTOMY  2001  . BLADDER SUSPENSION    . BUNIONECTOMY Left 2000  . CESAREAN SECTION  1987; 1988  . COLONOSCOPY    . JOINT REPLACEMENT    . MASS EXCISION  10/22/2011   Procedure: EXCISION MASS;  Surgeon: Shelly Rubenstein, MD;  Location: [facility];  Service: General;  Laterality: Right;  excision right buttock mass  . TOTAL HIP ARTHROPLASTY Left 1993; 1995; 2000  . TOTAL KNEE ARTHROPLASTY Left 05/07/2015   Procedure: TOTAL LEFT KNEE ARTHROPLASTY;  Surgeon: Ollen Gross, MD;  Location: [facility];  Service: Orthopedics;  Laterality: Left;  .  TOTAL KNEE ARTHROPLASTY Right 10/01/2015   Procedure: RIGHT TOTAL KNEE ARTHROPLASTY;  Surgeon: Ollen Gross, MD;  Location: [facility];  Service: Orthopedics;  Laterality: Right;    There were no vitals filed for this visit.      Subjective Assessment - 11/09/15 1114    Subjective Reports that she doesn't have any pain and ready for D/C. Does report difficulty with getting into and out of her car.   Patient Stated Goals Just get back to normal.   Currently in Pain? No/denies            [facility] PT Assessment - 11/09/15 0001      Assessment   Medical Diagnosis Right total knee replacement.   Onset Date/Surgical Date 10/01/15   Next MD Visit 11/13/2015     Restrictions   Weight Bearing Restrictions No     Observation/Other Assessments-Edema    Edema Circumferential     Circumferential Edema   Circumferential - Right 37.8 cm   Circumferential - Left  35.9 cm                     [facility] Adult PT Treatment/Exercise - 11/09/15 0001      Ambulation/Gait   Stairs Yes   Stairs Assistance 6: Modified independent (Device/Increase  time)   Stairs Assistance Details (indicate cue type and reason) One rail to R but patient did not use   Stair Management Technique One rail Right;Alternating pattern;Step to pattern  alternating pattern ascending, step to pattern descending   Number of Stairs 12   Height of Stairs 6     Knee/Hip Exercises: Aerobic   Stationary Bike L1 x10 reps     Knee/Hip Exercises: Machines for Strengthening   Cybex Knee Extension 10# 2x10 reps    Cybex Knee Flexion 20# 2x10 reps     Knee/Hip Exercises: Standing   Terminal Knee Extension Limitations x20 reps Pink XTS RLE   Step Down Both;2 sets;10 reps;Hand Hold: 2;Step Height: 6"     Modalities   Modalities Electrical Stimulation;Vasopneumatic     Electrical Stimulation   Electrical Stimulation Location R knee   Electrical Stimulation Action IFC   Electrical Stimulation Parameters 1-10 hz x15 min    Electrical Stimulation Goals Edema;Pain     Vasopneumatic   Number Minutes Vasopneumatic  15 minutes   Vasopnuematic Location  Knee   Vasopneumatic Pressure Medium   Vasopneumatic Temperature  72                  PT Short Term Goals - 10/25/15 1026      PT SHORT TERM GOAL #1   Title Ind with a initial HEP.   Time 4   Period Weeks   Status Achieved           PT Long Term Goals - 11/09/15 1138      PT LONG TERM GOAL #1   Title Ind with an advanced HEP.   Time 8   Period Weeks   Status Unable to assess     PT LONG TERM GOAL #2   Title Decrease edema to within 2 cms of contralateral side to assist with range of motion gains and decrease pain.   Time 8   Period Weeks   Status Achieved  1.9 cm difference with R > L 11/09/2015     PT LONG TERM GOAL #3   Title Active right knee flexion to 115 degrees+ so the patient can perform functional tasks and do so with pain not > 2-3/10.   Period Weeks   Status Achieved     PT LONG TERM GOAL #4   Title Perform a reciprocating stair gait with one railing with pain not > 2-3/10.   Time 8   Period Weeks   Status Not Met  Ascends reciprically, descends step to pattern 11/09/2015     PT LONG TERM GOAL #5   Title Perform ADL's with pain not > 3/10.   Time 8   Period Weeks   Status Partially Met  Can do ADLs but pain increases with prolonged activities 11/09/2015               Plan - 11/09/15 1208    Clinical Impression Statement Patient has progressed well following R TKR and arrived with no pain in R knee today. Patient able to complete exercises as directed with only minimal to moderate multimodal cueing for proper exercise technique. Patient able to achieve R knee ROM and edema goal but unable to fully achieve stair gait and ADLs goal as she has pain following prolonged activity per patient report and descending with step to pattern. Normal modalities response noted following removal of the modalities.    Rehab Potential Excellent   PT Frequency 2x / week   PT  Duration 8 weeks   PT Treatment/Interventions ADLs/Self Care Home Management;Cryotherapy;Electrical Stimulation;Therapeutic activities;Therapeutic exercise;Patient/family education;Passive range of motion;Manual techniques   PT Next Visit Plan D/C summary required.   Consulted and Agree with Plan of Care Patient      Patient will benefit from skilled therapeutic intervention in order to improve the following deficits and impairments:  Pain, Decreased activity tolerance, Decreased range of motion, Increased edema  Visit Diagnosis: Acute pain of right knee  Stiffness of right knee, not elsewhere classified  Localized edema     Problem List Patient Active Problem List   Diagnosis Date Noted  . Partial symptomatic epilepsy with complex partial seizures, not intractable, with status epilepticus ([place]) 07/24/2015  . Insomnia w/ sleep apnea 07/24/2015  . OA (osteoarthritis) of knee 05/07/2015  . Memory loss 02/20/2014  . Moderate major depression ([place]) 10/14/2010    Ahmed Prima, PTA 11/09/15 4:25 PM  [facility] [place], [place], [address] Phone: [phone]   Fax:  [phone]  Name: [name] MRN: [id number] Date of Birth: 1965/11/17  PHYSICAL THERAPY DISCHARGE SUMMARY  Visits from Start of Care: 7.  Current functional level related to goals / functional outcomes: Please see above.   Remaining deficits: Goals essentially met.   Education / Equipment: HEP. Plan: Patient agrees to discharge.  Patient goals were partially met. Patient is being discharged due to being pleased with the current functional level.  ?????         Mali Applegate MPT

## 2015-11-10 ENCOUNTER — Other Ambulatory Visit: Payer: Self-pay | Admitting: Family Medicine

## 2015-11-10 DIAGNOSIS — F321 Major depressive disorder, single episode, moderate: Secondary | ICD-10-CM

## 2015-11-13 ENCOUNTER — Encounter (HOSPITAL_COMMUNITY): Payer: Self-pay

## 2015-11-13 ENCOUNTER — Emergency Department (HOSPITAL_COMMUNITY)
Admission: EM | Admit: 2015-11-13 | Discharge: 2015-11-13 | Disposition: A | Discharge status: Home / Self Care | Payer: PPO | Attending: Emergency Medicine | Admitting: Emergency Medicine

## 2015-11-13 DIAGNOSIS — R569 Unspecified convulsions: Secondary | ICD-10-CM | POA: Diagnosis not present

## 2015-11-13 DIAGNOSIS — Z91040 Latex allergy status: Secondary | ICD-10-CM | POA: Diagnosis not present

## 2015-11-13 DIAGNOSIS — N189 Chronic kidney disease, unspecified: Secondary | ICD-10-CM | POA: Insufficient documentation

## 2015-11-13 DIAGNOSIS — F445 Conversion disorder with seizures or convulsions: Secondary | ICD-10-CM | POA: Diagnosis not present

## 2015-11-13 DIAGNOSIS — Z79899 Other long term (current) drug therapy: Secondary | ICD-10-CM | POA: Insufficient documentation

## 2015-11-13 LAB — CBG MONITORING, ED: Glucose-Capillary: 120 mg/dL — ABNORMAL HIGH (ref 65–99)

## 2015-11-13 NOTE — ED Notes (Signed)
No seizure witnessed during pt stay in the ER.

## 2015-11-13 NOTE — ED Provider Notes (Signed)
[facility] DEPT Provider Note   CSN: [id number] Arrival date & time: 11/13/15  [id number]     History   Chief Complaint Chief Complaint  Patient presents with  . Seizures    HPI [name] is a 50 y.o. female.  The history is provided by the patient.  Seizures   This is a chronic problem. Episode onset: 1 hour ago. The problem has been resolved. Number of times: several clusters. The most recent episode lasted less than 30 seconds. Characteristics include rhythmic jerking. Characteristics do not include bowel incontinence or bladder incontinence. The episode was witnessed. There was no sensation of an aura present. The seizures did not continue in the ED. The seizure(s) had no focality. increased stress with husband's illness (leukemia) and being in a crowded waiting room waiting for orthopedic appointment There has been no fever.    Past Medical History:  Diagnosis Date  . Arthritis    "knees" (04/22/2012)  . Bronchitis   . Chronic bronchitis ([place])    "yearly; when the weather changes" (04/22/2012)  . Chronic kidney disease   . Colon polyps    adenomatous and hyperplastic-  . Constipation   . Depression   . Eczema   . Epilepsy ([place])    "been having them right often here lately" (04/22/2012)  . Fatty liver   . [id number])    "1/wk" (04/22/2012)  . High cholesterol   . History of kidney stones   . Osteoarthritis    [name] 04/22/2012  . Other convulsions 05/21/12   non-epileptic spells  . Rectal bleed    in toilet- bright red  . Restless leg   . Seizures ([place])   . Vertigo     Patient Active Problem List   Diagnosis Date Noted  . Partial symptomatic epilepsy with complex partial seizures, not intractable, with status epilepticus ([place]) 07/24/2015  . Insomnia w/ sleep apnea 07/24/2015  . OA (osteoarthritis) of knee 05/07/2015  . Memory loss 02/20/2014  . Moderate major depression ([place]) 10/14/2010    Past Surgical History:  Procedure Laterality Date  .  ABDOMINAL HYSTERECTOMY  2001  . BLADDER SUSPENSION    . BUNIONECTOMY Left 2000  . CESAREAN SECTION  1987; 1988  . COLONOSCOPY    . JOINT REPLACEMENT    . MASS EXCISION  10/22/2011   Procedure: EXCISION MASS;  Surgeon: [name], MD;  Location: [employer];  Service: General;  Laterality: Right;  excision right buttock mass  . TOTAL HIP ARTHROPLASTY Left 1993; 1995; 2000  . TOTAL KNEE ARTHROPLASTY Left 05/07/2015   Procedure: TOTAL LEFT KNEE ARTHROPLASTY;  Surgeon: [name], MD;  [facility];  Service: Orthopedics;  Laterality: Left;  . TOTAL KNEE ARTHROPLASTY Right 10/01/2015   Procedure: RIGHT TOTAL KNEE ARTHROPLASTY;  Surgeon: [name], MD;  [facility];  Service: Orthopedics;  Laterality: Right;    OB History    No data available       Home Medications    Prior to Admission medications   Medication Sig Start Date End Date Taking? Authorizing Provider  citalopram (CELEXA) 40 MG tablet TAKE ONE TABLET BY MOUTH ONCE DAILY 11/12/15  Yes [name], MD  clonazePAM (KLONOPIN) 0.5 MG tablet Take 1 tablet (0.5 mg total) by mouth at bedtime. 09/26/15  Yes [name], MD  gabapentin (NEURONTIN) 600 MG tablet Take 1 tablet (600 mg total) by mouth 3 (three) times daily. 10/23/15  Yes [name], MD  lamoTRIgine (LAMICTAL) 100 MG tablet TAKE ONE  TABLET BY MOUTH TWICE DAILY 09/18/15  Yes [name], MD  levETIRAcetam (KEPPRA) 500 MG tablet Take 3 tablets PO in the morning and 3 tablets PO in the evening. 09/25/15  Yes [name], NP  pramipexole (MIRAPEX) 0.5 MG tablet TAKE ONE TABLET BY MOUTH TWICE DAILY 11/12/15  Yes [name], MD  cyclobenzaprine (FLEXERIL) 10 MG tablet Take 1 tablet (10 mg total) by mouth 3 (three) times daily as needed. Patient not taking: Reported on 11/13/2015 10/03/15   Arlee Muslim, PA-C  HYDROmorphone (DILAUDID) 2 MG tablet Take 1-2 tablets (2-4 mg total) by mouth every 3 (three) hours as needed for severe pain. Patient not  taking: Reported on 11/13/2015 10/03/15   Arlee Muslim, PA-C  hydrOXYzine (ATARAX/VISTARIL) 25 MG tablet Take 1 tablet (25 mg total) by mouth every 6 (six) hours as needed for itching. Patient not taking: Reported on 11/13/2015 10/03/15   Arlee Muslim, PA-C  LYRICA 100 MG capsule  10/19/15   Historical Provider, MD  ondansetron (ZOFRAN) 4 MG tablet Take 1 tablet (4 mg total) by mouth every 6 (six) hours as needed for nausea. 10/03/15   Arlee Muslim, PA-C  rivaroxaban (XARELTO) 10 MG TABS tablet Take 1 tablet (10 mg total) by mouth daily with breakfast. Take Xarelto for two and a half more weeks, then discontinue Xarelto. Once the patient has completed the Xarelto, they may resume the 81 mg Aspirin. Patient not taking: Reported on 11/13/2015 10/03/15   Arlee Muslim, PA-C    Family History Family History  Problem Relation Age of Onset  . Cancer Mother   . Cancer Brother   . Diabetes Brother   . Cancer Maternal Aunt   . Diabetes Maternal Aunt     Social History Social History  Substance Use Topics  . Smoking status: Never Smoker  . Smokeless tobacco: Never Used  . Alcohol use No     Allergies   Acetaminophen; Benadryl [diphenhydramine hcl]; Codeine; Dilantin [phenytoin sodium extended]; Melatonin; Ultram [tramadol hcl]; Vimpat [lacosamide]; Betadine [povidone iodine]; Latex; Penicillins; Sulfa antibiotics; Tape; and Vicodin [hydrocodone-acetaminophen]   Review of Systems Review of Systems  Gastrointestinal: Negative for bowel incontinence.  Genitourinary: Negative for bladder incontinence.  Neurological: Positive for seizures.  All other systems reviewed and are negative.    Physical Exam Updated Vital Signs BP 124/93 (BP Location: Right Arm)   Pulse 120   Temp 98.1 F (36.7 C) (Oral)   Resp 17   SpO2 97%   Physical Exam  Constitutional: She is oriented to person, place, and time. She appears well-developed and well-nourished. No distress.  HENT:  Head: Normocephalic.    Nose: Nose normal.  Eyes: Conjunctivae are normal.  Neck: Neck supple. No tracheal deviation present.  Cardiovascular: Normal rate and regular rhythm.   Pulmonary/Chest: Effort normal. No respiratory distress.  Abdominal: Soft. She exhibits no distension.  Neurological: She is alert and oriented to person, place, and time. She has normal strength. No cranial nerve deficit. She displays no seizure activity. GCS eye subscore is 4. GCS verbal subscore is 5. GCS motor subscore is 6.  Skin: Skin is warm and dry.  Psychiatric: She has a normal mood and affect.     ED Treatments / Results  Labs (all labs ordered are listed, but only abnormal results are displayed) Labs Reviewed  CBG MONITORING, ED - Abnormal; Notable for the following:       Result Value   Glucose-Capillary 120 (*)    All other components within normal limits  EKG  EKG Interpretation None       Radiology No results found.  Procedures Procedures (including critical care time)  Medications Ordered in ED Medications - No data to display   Initial Impression / Assessment and Plan / ED Course  I have reviewed the triage vital signs and the nursing notes.  Pertinent labs & imaging results that were available during my care of the patient were reviewed by me and considered in my medical decision making (see chart for details).  Clinical Course    50 y.o. female presents with typical pseudoseizure episode. She has had increased stress with her husband's treatment for leukemia and she went to a follow up appointment with orthopedic surgery today and was in a crowded wiaitng room which precipitated her shaking which she remembers. She was surrounded by bystanders trying to help which exacerbated the symptoms and EMS came and gave her midazolam which helped. She states she is pending outpatient psychiatric assessment which is appropriate. No further episodes here. Plan to follow up with PCP as needed and return  precautions discussed for worsening or new concerning symptoms.   Final Clinical Impressions(s) / ED Diagnoses   Final diagnoses:  Pseudoseizure    New Prescriptions New Prescriptions   No medications on file     Leo Grosser, MD 11/14/15 0246

## 2015-11-13 NOTE — ED Notes (Signed)
Bed: WA09 Expected date:  Expected time:  Means of arrival:  Comments: EMs

## 2015-11-13 NOTE — ED Triage Notes (Signed)
Pt BIB [facility] from [facility] waiting room after 7 witnessed seizures. Hx of grand mal seizures. Last seizure with EMS was grand mal lasting 5 secs. Pt A&Ox4 at this time. Pt given 5 midazolam IM en route. CBG 131.

## 2015-11-21 ENCOUNTER — Ambulatory Visit (INDEPENDENT_AMBULATORY_CARE_PROVIDER_SITE_OTHER): Payer: PPO | Admitting: Physician Assistant

## 2015-11-21 ENCOUNTER — Encounter: Payer: Self-pay | Admitting: Physician Assistant

## 2015-11-21 VITALS — BP 129/95 | HR 83 | Temp 97.4°F | Ht 60.0 in | Wt 163.6 lb

## 2015-11-21 DIAGNOSIS — L309 Dermatitis, unspecified: Secondary | ICD-10-CM

## 2015-11-21 DIAGNOSIS — F5101 Primary insomnia: Secondary | ICD-10-CM

## 2015-11-21 MED ORDER — TRIAMCINOLONE ACETONIDE 0.5 % EX CREA: 1.0000 "application " | TOPICAL_CREAM | Freq: Three times a day (TID) | CUTANEOUS | 0 refills | Status: DC

## 2015-11-21 MED ORDER — TRAZODONE HCL 50 MG PO TABS: 50.0000 mg | ORAL_TABLET | Freq: Every evening | ORAL | 5 refills | Status: DC | PRN

## 2015-11-21 MED ORDER — PREDNISONE 10 MG (21) PO TBPK: ORAL_TABLET | ORAL | 0 refills | Status: DC

## 2015-11-21 MED ORDER — HYDROXYZINE HCL 25 MG PO TABS: 25.0000 mg | ORAL_TABLET | Freq: Four times a day (QID) | ORAL | 0 refills | Status: DC | PRN

## 2015-11-21 NOTE — Progress Notes (Signed)
BP (!) 129/95   Pulse 83   Temp 97.4 F (36.3 C) (Oral)   Ht 5' (1.524 m)   Wt 163 lb 9.6 oz (74.2 kg)   BMI 31.95 kg/m    Subjective:    Patient ID: [name], female    DOB: [birth year], 50 y.o.   MRN: [id number]  HPI: [name] is a 50 y.o. female presenting on 11/21/2015 for Rash (off and on for 6 months)  She has had a long-standing history of eczema in the past. She has had this rash going on on her forearms and elbows for many weeks. Nothing over-the-counter has helped. She has used over-the-counter cortisone without relief. She has not taken any oral prednisone. In the past she has used hydroxyzine with some benefit. She also is having significant insomnia. But she is very sensitive to many medications. She states she has taken trazodone in the past. We will try this medication.  Relevant past medical, surgical, family and social history reviewed and updated as indicated. Allergies and medications reviewed and updated.  Past Medical History:  Diagnosis Date  . Arthritis    "knees" (04/22/2012)  . Bronchitis   . Chronic bronchitis ([place])    "yearly; when the weather changes" (04/22/2012)  . Chronic kidney disease   . Colon polyps    adenomatous and hyperplastic-  . Constipation   . Depression   . Eczema   . Epilepsy ([place])    "been having them right often here lately" (04/22/2012)  . Fatty liver   . [id number])    "1/wk" (04/22/2012)  . High cholesterol   . History of kidney stones   . Osteoarthritis    [name] 04/22/2012  . Other convulsions 05/21/12   non-epileptic spells  . Rectal bleed    in toilet- bright red  . Restless leg   . Seizures ([place])   . Vertigo     Past Surgical History:  Procedure Laterality Date  . ABDOMINAL HYSTERECTOMY  2001  . BLADDER SUSPENSION    . BUNIONECTOMY Left 2000  . CESAREAN SECTION  1987; 1988  . COLONOSCOPY    . JOINT REPLACEMENT    . MASS EXCISION  10/22/2011   Procedure: EXCISION MASS;  Surgeon: [name], MD;  Location: [facility];  Service: General;  Laterality: Right;  excision right buttock mass  . TOTAL HIP ARTHROPLASTY Left 1993; 1995; 2000  . TOTAL KNEE ARTHROPLASTY Left 05/07/2015   Procedure: TOTAL LEFT KNEE ARTHROPLASTY;  Surgeon: [name], MD;  [facility];  Service: Orthopedics;  Laterality: Left;  . TOTAL KNEE ARTHROPLASTY Right 10/01/2015   Procedure: RIGHT TOTAL KNEE ARTHROPLASTY;  Surgeon: [name], MD;  [facility];  Service: Orthopedics;  Laterality: Right;    Review of Systems  Constitutional: Negative.  Negative for fatigue.  HENT: Negative.   Eyes: Negative.   Respiratory: Negative.   Gastrointestinal: Negative.   Genitourinary: Negative.   Skin: Positive for color change and rash.  Psychiatric/Behavioral: Positive for sleep disturbance. Negative for dysphoric mood. The patient is nervous/anxious.       Medication List       Accurate as of 11/21/15  4:54 PM. Always use your most recent med list.          citalopram 40 MG tablet Commonly known as:  CELEXA TAKE ONE TABLET BY MOUTH ONCE DAILY   clonazePAM 0.5 MG tablet Commonly known as:  KLONOPIN Take 1 tablet (0.5 mg total) by mouth at  bedtime.   cyclobenzaprine 10 MG tablet Commonly known as:  FLEXERIL Take 1 tablet (10 mg total) by mouth 3 (three) times daily as needed.   gabapentin 600 MG tablet Commonly known as:  NEURONTIN Take 1 tablet (600 mg total) by mouth 3 (three) times daily.   HYDROmorphone 2 MG tablet Commonly known as:  DILAUDID Take 1-2 tablets (2-4 mg total) by mouth every 3 (three) hours as needed for severe pain.   hydrOXYzine 25 MG tablet Commonly known as:  ATARAX/VISTARIL Take 1 tablet (25 mg total) by mouth every 6 (six) hours as needed for itching.   lamoTRIgine 100 MG tablet Commonly known as:  LAMICTAL TAKE ONE TABLET BY MOUTH TWICE DAILY   levETIRAcetam 500 MG tablet Commonly known as:  KEPPRA Take 3 tablets PO in the morning and 3 tablets PO  in the evening.   pramipexole 0.5 MG tablet Commonly known as:  MIRAPEX TAKE ONE TABLET BY MOUTH TWICE DAILY   predniSONE 10 MG (21) Tbpk tablet Commonly known as:  STERAPRED UNI-PAK 21 TAB As directed x 6 days   rivaroxaban 10 MG Tabs tablet Commonly known as:  XARELTO Take 1 tablet (10 mg total) by mouth daily with breakfast. Take Xarelto for two and a half more weeks, then discontinue Xarelto. Once the patient has completed the Xarelto, they may resume the 81 mg Aspirin.   traZODone 50 MG tablet Commonly known as:  DESYREL Take 1-2 tablets (50-100 mg total) by mouth at bedtime as needed for sleep.   triamcinolone cream 0.5 % Commonly known as:  KENALOG Apply 1 application topically 3 (three) times daily.          Objective:    BP (!) 129/95   Pulse 83   Temp 97.4 F (36.3 C) (Oral)   Ht 5' (1.524 m)   Wt 163 lb 9.6 oz (74.2 kg)   BMI 31.95 kg/m   Allergies  Allergen Reactions  . Acetaminophen Other (See Comments)    Has fatty deposits on liver  . Benadryl [Diphenhydramine Hcl] Other (See Comments)    hyperactivity and seizures  . Codeine Itching and Rash    seizures  . Dilantin [Phenytoin Sodium Extended] Other (See Comments)    Elevated LFT's  . Melatonin Other (See Comments)    seizures  . Ultram [Tramadol Hcl] Other (See Comments)    Seizures  . Vimpat [Lacosamide] Other (See Comments)    Severe dizziness  . Betadine [Povidone Iodine] Rash  . Latex Rash  . Penicillins Itching and Rash    Has patient had a PCN reaction causing immediate rash, facial/tongue/throat swelling, SOB or lightheadedness with hypotension: No Has patient had a PCN reaction causing severe rash involving mucus membranes or skin necrosis: No Has patient had a PCN reaction that required hospitalization No Has patient had a PCN reaction occurring within the last 10 years: No If all of the above answers are "NO", then may proceed with Cephalosporin use.  . Sulfa Antibiotics Rash  .  Tape Rash    Paper tape please  . Vicodin [Hydrocodone-Acetaminophen] Itching    Physical Exam  Constitutional: She is oriented to person, place, and time. She appears well-developed and well-nourished.  HENT:  Head: Normocephalic and atraumatic.  Eyes: Conjunctivae and EOM are normal. Pupils are equal, round, and reactive to light.  Cardiovascular: Normal rate, regular rhythm, normal heart sounds and intact distal pulses.   Pulmonary/Chest: Effort normal and breath sounds normal.  Abdominal: Soft. Bowel sounds are  normal.  Neurological: She is alert and oriented to person, place, and time. She has normal reflexes.  Skin: Skin is warm, dry and intact. Rash noted. Rash is macular and nodular. She is not diaphoretic. There is erythema. No pallor.     Psychiatric: She has a normal mood and affect. Her behavior is normal. Judgment and thought content normal.        Assessment & Plan:   1. Eczema, unspecified type - triamcinolone cream (KENALOG) 0.5 %; Apply 1 application topically 3 (three) times daily.  Dispense: 30 g; Refill: 0 - predniSONE (STERAPRED UNI-PAK 21 TAB) 10 MG (21) TBPK tablet; As directed x 6 days  Dispense: 21 tablet; Refill: 0 - hydrOXYzine (ATARAX/VISTARIL) 25 MG tablet; Take 1 tablet (25 mg total) by mouth every 6 (six) hours as needed for itching.  Dispense: 50 tablet; Refill: 0  2. Primary insomnia - traZODone (DESYREL) 50 MG tablet; Take 1-2 tablets (50-100 mg total) by mouth at bedtime as needed for sleep.  Dispense: 60 tablet; Refill: 5   Continue all other maintenance medications as listed above.  Follow up plan: Return if symptoms worsen or fail to improve.  No orders of the defined types were placed in this encounter.   Educational handout given for insomnia  [name] PA-C [facility] [address]  [place], [place] [address] [phone]   11/21/2015, 4:54 PM

## 2015-11-21 NOTE — Patient Instructions (Signed)
Insomnia Insomnia is a sleep disorder that makes it difficult to fall asleep or to stay asleep. Insomnia can cause tiredness (fatigue), low energy, difficulty concentrating, mood swings, and poor performance at work or school.  There are three different ways to classify insomnia:  Difficulty falling asleep.  Difficulty staying asleep.  Waking up too early in the morning. Any type of insomnia can be long-term (chronic) or short-term (acute). Both are common. Short-term insomnia usually lasts for three months or less. Chronic insomnia occurs at least three times a week for longer than three months. CAUSES  Insomnia may be caused by another condition, situation, or substance, such as:  Anxiety.  Certain medicines.  Gastroesophageal reflux disease (GERD) or other gastrointestinal conditions.  Asthma or other breathing conditions.  Restless legs syndrome, sleep apnea, or other sleep disorders.  Chronic pain.  Menopause. This may include hot flashes.  Stroke.  Abuse of alcohol, tobacco, or illegal drugs.  Depression.  Caffeine.   Neurological disorders, such as Alzheimer disease.  An overactive thyroid (hyperthyroidism). The cause of insomnia may not be known. RISK FACTORS Risk factors for insomnia include:  Gender. Women are more commonly affected than men.  Age. Insomnia is more common as you get older.  Stress. This may involve your professional or personal life.  Income. Insomnia is more common in people with lower income.  Lack of exercise.   Irregular work schedule or night shifts.  Traveling between different time zones. SIGNS AND SYMPTOMS If you have insomnia, trouble falling asleep or trouble staying asleep is the main symptom. This may lead to other symptoms, such as:  Feeling fatigued.  Feeling nervous about going to sleep.  Not feeling rested in the morning.  Having trouble concentrating.  Feeling irritable, anxious, or depressed. TREATMENT   Treatment for insomnia depends on the cause. If your insomnia is caused by an underlying condition, treatment will focus on addressing the condition. Treatment may also include:   Medicines to help you sleep.  Counseling or therapy.  Lifestyle adjustments. HOME CARE INSTRUCTIONS   Take medicines only as directed by your health care provider.  Keep regular sleeping and waking hours. Avoid naps.  Keep a sleep diary to help you and your health care provider figure out what could be causing your insomnia. Include:   When you sleep.  When you wake up during the night.  How well you sleep.   How rested you feel the next day.  Any side effects of medicines you are taking.  What you eat and drink.   Make your bedroom a comfortable place where it is easy to fall asleep:  Put up shades or special blackout curtains to block light from outside.  Use a white noise machine to block noise.  Keep the temperature cool.   Exercise regularly as directed by your health care provider. Avoid exercising right before bedtime.  Use relaxation techniques to manage stress. Ask your health care provider to suggest some techniques that may work well for you. These may include:  Breathing exercises.  Routines to release muscle tension.  Visualizing peaceful scenes.  Cut back on alcohol, caffeinated beverages, and cigarettes, especially close to bedtime. These can disrupt your sleep.  Do not overeat or eat spicy foods right before bedtime. This can lead to digestive discomfort that can make it hard for you to sleep.  Limit screen use before bedtime. This includes:  Watching TV.  Using your smartphone, tablet, and computer.  Stick to a routine. This   can help you fall asleep faster. Try to do a quiet activity, brush your teeth, and go to bed at the same time each night.  Get out of bed if you are still awake after 15 minutes of trying to sleep. Keep the lights down, but try reading or  doing a quiet activity. When you feel sleepy, go back to bed.  Make sure that you drive carefully. Avoid driving if you feel very sleepy.  Keep all follow-up appointments as directed by your health care provider. This is important. SEEK MEDICAL CARE IF:   You are tired throughout the day or have trouble in your daily routine due to sleepiness.  You continue to have sleep problems or your sleep problems get worse. SEEK IMMEDIATE MEDICAL CARE IF:   You have serious thoughts about hurting yourself or someone else.   This information is not intended to replace advice given to you by your health care provider. Make sure you discuss any questions you have with your health care provider.   Document Released: 12/28/1999 Document Revised: 09/20/2014 Document Reviewed: 09/30/2013 Elsevier Interactive Patient Education 2016 Elsevier Inc.  

## 2015-11-22 ENCOUNTER — Telehealth: Payer: Self-pay | Admitting: Family Medicine

## 2015-11-22 NOTE — Telephone Encounter (Signed)
I'm not sure who called or why. There are EMMI campaigns calling now about Flu shots but her is up to date so I'm not sure. Called patient to discuss this but her phone was busy.

## 2015-11-23 DIAGNOSIS — Z96651 Presence of right artificial knee joint: Secondary | ICD-10-CM | POA: Diagnosis not present

## 2015-11-26 ENCOUNTER — Ambulatory Visit (INDEPENDENT_AMBULATORY_CARE_PROVIDER_SITE_OTHER): Payer: PPO | Admitting: Family

## 2015-11-26 ENCOUNTER — Encounter: Payer: Self-pay | Admitting: Family

## 2015-11-26 VITALS — BP 110/76 | HR 94 | Temp 99.8°F | Ht 60.0 in | Wt 164.2 lb

## 2015-11-26 DIAGNOSIS — R05 Cough: Secondary | ICD-10-CM | POA: Diagnosis not present

## 2015-11-26 DIAGNOSIS — J069 Acute upper respiratory infection, unspecified: Secondary | ICD-10-CM | POA: Diagnosis not present

## 2015-11-26 DIAGNOSIS — R059 Cough, unspecified: Secondary | ICD-10-CM

## 2015-11-26 LAB — VERITOR FLU A/B WAIVED
Influenza A: NEGATIVE
Influenza B: NEGATIVE

## 2015-11-26 MED ORDER — HYDROCODONE-HOMATROPINE 5-1.5 MG/5ML PO SYRP: 5.0000 mL | ORAL_SOLUTION | Freq: Three times a day (TID) | ORAL | 0 refills | Status: DC | PRN

## 2015-11-26 MED ORDER — BENZONATATE 200 MG PO CAPS: 200.0000 mg | ORAL_CAPSULE | Freq: Three times a day (TID) | ORAL | 1 refills | Status: DC | PRN

## 2015-11-26 NOTE — Patient Instructions (Signed)
Upper Respiratory Infection, Adult Most upper respiratory infections (URIs) are a viral infection of the air passages leading to the lungs. A URI affects the nose, throat, and upper air passages. The most common type of URI is nasopharyngitis and is typically referred to as "the common cold." URIs run their course and usually go away on their own. Most of the time, a URI does not require medical attention, but sometimes a bacterial infection in the upper airways can follow a viral infection. This is called a secondary infection. Sinus and middle ear infections are common types of secondary upper respiratory infections. Bacterial pneumonia can also complicate a URI. A URI can worsen asthma and chronic obstructive pulmonary disease (COPD). Sometimes, these complications can require emergency medical care and may be life threatening.  CAUSES Almost all URIs are caused by viruses. A virus is a type of germ and can spread from one person to another.  RISKS FACTORS You may be at risk for a URI if:   You smoke.   You have chronic heart or lung disease.  You have a weakened defense (immune) system.   You are very young or very old.   You have nasal allergies or asthma.  You work in crowded or poorly ventilated areas.  You work in health care facilities or schools. SIGNS AND SYMPTOMS  Symptoms typically develop 2-3 days after you come in contact with a cold virus. Most viral URIs last 7-10 days. However, viral URIs from the influenza virus (flu virus) can last 14-18 days and are typically more severe. Symptoms may include:   Runny or stuffy (congested) nose.   Sneezing.   Cough.   Sore throat.   Headache.   Fatigue.   Fever.   Loss of appetite.   Pain in your forehead, behind your eyes, and over your cheekbones (sinus pain).  Muscle aches.  DIAGNOSIS  Your health care provider may diagnose a URI by:  Physical exam.  Tests to check that your symptoms are not due to  another condition such as:  Strep throat.  Sinusitis.  Pneumonia.  Asthma. TREATMENT  A URI goes away on its own with time. It cannot be cured with medicines, but medicines may be prescribed or recommended to relieve symptoms. Medicines may help:  Reduce your fever.  Reduce your cough.  Relieve nasal congestion. HOME CARE INSTRUCTIONS   Take medicines only as directed by your health care provider.   Gargle warm saltwater or take cough drops to comfort your throat as directed by your health care provider.  Use a warm mist humidifier or inhale steam from a shower to increase air moisture. This may make it easier to breathe.  Drink enough fluid to keep your urine clear or pale yellow.   Eat soups and other clear broths and maintain good nutrition.   Rest as needed.   Return to work when your temperature has returned to normal or as your health care provider advises. You may need to stay home longer to avoid infecting others. You can also use a face mask and careful hand washing to prevent spread of the virus.  Increase the usage of your inhaler if you have asthma.   Do not use any tobacco products, including cigarettes, chewing tobacco, or electronic cigarettes. If you need help quitting, ask your health care provider. PREVENTION  The best way to protect yourself from getting a cold is to practice good hygiene.   Avoid oral or hand contact with people with cold   symptoms.   Wash your hands often if contact occurs.  There is no clear evidence that vitamin C, vitamin E, echinacea, or exercise reduces the chance of developing a cold. However, it is always recommended to get plenty of rest, exercise, and practice good nutrition.  SEEK MEDICAL CARE IF:   You are getting worse rather than better.   Your symptoms are not controlled by medicine.   You have chills.  You have worsening shortness of breath.  You have brown or red mucus.  You have yellow or brown nasal  discharge.  You have pain in your face, especially when you bend forward.  You have a fever.  You have swollen neck glands.  You have pain while swallowing.  You have white areas in the back of your throat. SEEK IMMEDIATE MEDICAL CARE IF:   You have severe or persistent:  Headache.  Ear pain.  Sinus pain.  Chest pain.  You have chronic lung disease and any of the following:  Wheezing.  Prolonged cough.  Coughing up blood.  A change in your usual mucus.  You have a stiff neck.  You have changes in your:  Vision.  Hearing.  Thinking.  Mood. MAKE SURE YOU:   Understand these instructions.  Will watch your condition.  Will get help right away if you are not doing well or get worse.   This information is not intended to replace advice given to you by your health care provider. Make sure you discuss any questions you have with your health care provider.   Document Released: 06/25/2000 Document Revised: 05/16/2014 Document Reviewed: 04/06/2013 Elsevier Interactive Patient Education 2016 Elsevier Inc.  

## 2015-11-26 NOTE — Progress Notes (Signed)
Subjective:    Patient ID: [name], female    DOB: 1965/09/21, 50 y.o.   MRN: [id number]  Cough  This is a new problem. The current episode started yesterday. The problem has been gradually worsening. The problem occurs every few minutes. The cough is non-productive. Associated symptoms include chills, a fever, headaches, myalgias, postnasal drip, a sore throat and shortness of breath. Pertinent negatives include no ear congestion, ear pain, nasal congestion, rhinorrhea or wheezing. The symptoms are aggravated by lying down. She has tried rest and OTC cough suppressant (tylneol) for the symptoms. The treatment provided mild relief.  Fever   Associated symptoms include coughing, headaches and a sore throat. Pertinent negatives include no ear pain or wheezing.  Shortness of Breath  Associated symptoms include a fever, headaches and a sore throat. Pertinent negatives include no ear pain, rhinorrhea or wheezing.      Review of Systems  Constitutional: Positive for chills and fever.  HENT: Positive for postnasal drip and sore throat. Negative for ear pain and rhinorrhea.   Respiratory: Positive for cough and shortness of breath. Negative for wheezing.   Musculoskeletal: Positive for myalgias.  Neurological: Positive for headaches.  All other systems reviewed and are negative.      Objective:   Physical Exam  Constitutional: She is oriented to person, place, and time. She appears well-developed and well-nourished. No distress.  HENT:  Head: Normocephalic and atraumatic.  Right Ear: External ear normal.  Left Ear: External ear normal.  Nose: Mucosal edema and rhinorrhea present.  Mouth/Throat: Posterior oropharyngeal edema present.  Eyes: Pupils are equal, round, and reactive to light.  Neck: Normal range of motion. Neck supple. No thyromegaly present.  Cardiovascular: Normal rate, regular rhythm, normal heart sounds and intact distal pulses.   No murmur heard. Pulmonary/Chest:  Effort normal and breath sounds normal. No respiratory distress. She has no wheezes.  Abdominal: Soft. Bowel sounds are normal. She exhibits no distension. There is no tenderness.  Musculoskeletal: Normal range of motion. She exhibits no edema or tenderness.  Neurological: She is alert and oriented to person, place, and time.  Skin: Skin is warm and dry.  Psychiatric: She has a normal mood and affect. Her behavior is normal. Judgment and thought content normal.  Vitals reviewed.     BP 110/76   Pulse 94   Temp 99.8 F (37.7 C) (Oral)   Ht 5' (1.524 m)   Wt 164 lb 3.2 oz (74.5 kg)   SpO2 94%   BMI 32.07 kg/m      Assessment & Plan:  1. Cough - Veritor Flu A/B Waived - benzonatate (TESSALON) 200 MG capsule; Take 1 capsule (200 mg total) by mouth 3 (three) times daily as needed.  Dispense: 30 capsule; Refill: 1  2. Acute upper respiratory infection -- Take meds as prescribed - Use a cool mist humidifier  -Use saline nose sprays frequently -Saline irrigations of the nose can be very helpful if done frequently.  * 4X daily for 1 week*  * Use of a nettie pot can be helpful with this. Follow directions with this* -Force fluids -For any cough or congestion  Use plain Mucinex- regular strength or max strength is fine   * Children- consult with Pharmacist for dosing -For fever or aces or pains- take tylenol or ibuprofen appropriate for age and weight.  * for fevers greater than 101 orally you may alternate ibuprofen and tylenol every  3 hours. -Throat lozenges if help -New toothbrush  in 3 days  - benzonatate (TESSALON) 200 MG capsule; Take 1 capsule (200 mg total) by mouth 3 (three) times daily as needed.  Dispense: 30 capsule; Refill: [place], FNP

## 2015-11-27 NOTE — Telephone Encounter (Signed)
Patient has been seen since phone call 

## 2015-11-28 ENCOUNTER — Telehealth (HOSPITAL_COMMUNITY): Payer: Self-pay | Admitting: *Deleted

## 2015-11-28 ENCOUNTER — Encounter (HOSPITAL_COMMUNITY): Payer: Self-pay

## 2015-11-28 ENCOUNTER — Telehealth: Payer: Self-pay | Admitting: Family Medicine

## 2015-11-28 ENCOUNTER — Inpatient Hospital Stay (HOSPITAL_COMMUNITY)
Admission: EM | Admit: 2015-11-28 | Discharge: 2015-12-03 | DRG: 871 | Disposition: A | Discharge status: Home / Self Care | Payer: PPO | Attending: Internal Medicine | Admitting: Internal Medicine

## 2015-11-28 ENCOUNTER — Emergency Department (HOSPITAL_COMMUNITY): Payer: PPO

## 2015-11-28 ENCOUNTER — Ambulatory Visit: Payer: PPO | Admitting: Family Medicine

## 2015-11-28 DIAGNOSIS — R509 Fever, unspecified: Secondary | ICD-10-CM | POA: Diagnosis not present

## 2015-11-28 DIAGNOSIS — R110 Nausea: Secondary | ICD-10-CM | POA: Diagnosis not present

## 2015-11-28 DIAGNOSIS — Z96642 Presence of left artificial hip joint: Secondary | ICD-10-CM | POA: Diagnosis present

## 2015-11-28 DIAGNOSIS — G4733 Obstructive sleep apnea (adult) (pediatric): Secondary | ICD-10-CM | POA: Diagnosis present

## 2015-11-28 DIAGNOSIS — Z809 Family history of malignant neoplasm, unspecified: Secondary | ICD-10-CM | POA: Diagnosis not present

## 2015-11-28 DIAGNOSIS — G40201 Localization-related (focal) (partial) symptomatic epilepsy and epileptic syndromes with complex partial seizures, not intractable, with status epilepticus: Secondary | ICD-10-CM | POA: Diagnosis present

## 2015-11-28 DIAGNOSIS — Z90710 Acquired absence of both cervix and uterus: Secondary | ICD-10-CM

## 2015-11-28 DIAGNOSIS — Z87442 Personal history of urinary calculi: Secondary | ICD-10-CM

## 2015-11-28 DIAGNOSIS — Z86010 Personal history of colonic polyps: Secondary | ICD-10-CM | POA: Diagnosis not present

## 2015-11-28 DIAGNOSIS — Z9889 Other specified postprocedural states: Secondary | ICD-10-CM

## 2015-11-28 DIAGNOSIS — R000 Tachycardia, unspecified: Secondary | ICD-10-CM | POA: Diagnosis present

## 2015-11-28 DIAGNOSIS — F321 Major depressive disorder, single episode, moderate: Secondary | ICD-10-CM | POA: Diagnosis not present

## 2015-11-28 DIAGNOSIS — Z833 Family history of diabetes mellitus: Secondary | ICD-10-CM | POA: Diagnosis not present

## 2015-11-28 DIAGNOSIS — A419 Sepsis, unspecified organism: Secondary | ICD-10-CM | POA: Diagnosis not present

## 2015-11-28 DIAGNOSIS — R739 Hyperglycemia, unspecified: Secondary | ICD-10-CM | POA: Diagnosis present

## 2015-11-28 DIAGNOSIS — R0682 Tachypnea, not elsewhere classified: Secondary | ICD-10-CM | POA: Diagnosis present

## 2015-11-28 DIAGNOSIS — R918 Other nonspecific abnormal finding of lung field: Secondary | ICD-10-CM | POA: Diagnosis not present

## 2015-11-28 DIAGNOSIS — Z96653 Presence of artificial knee joint, bilateral: Secondary | ICD-10-CM | POA: Diagnosis present

## 2015-11-28 DIAGNOSIS — G4700 Insomnia, unspecified: Secondary | ICD-10-CM | POA: Diagnosis present

## 2015-11-28 DIAGNOSIS — G4730 Sleep apnea, unspecified: Secondary | ICD-10-CM

## 2015-11-28 DIAGNOSIS — M1990 Unspecified osteoarthritis, unspecified site: Secondary | ICD-10-CM | POA: Diagnosis not present

## 2015-11-28 DIAGNOSIS — J9601 Acute respiratory failure with hypoxia: Secondary | ICD-10-CM | POA: Diagnosis present

## 2015-11-28 DIAGNOSIS — J189 Pneumonia, unspecified organism: Secondary | ICD-10-CM | POA: Diagnosis present

## 2015-11-28 DIAGNOSIS — J181 Lobar pneumonia, unspecified organism: Secondary | ICD-10-CM | POA: Diagnosis present

## 2015-11-28 DIAGNOSIS — F329 Major depressive disorder, single episode, unspecified: Secondary | ICD-10-CM | POA: Diagnosis present

## 2015-11-28 DIAGNOSIS — Z79899 Other long term (current) drug therapy: Secondary | ICD-10-CM | POA: Diagnosis not present

## 2015-11-28 DIAGNOSIS — N189 Chronic kidney disease, unspecified: Secondary | ICD-10-CM | POA: Diagnosis not present

## 2015-11-28 DIAGNOSIS — E876 Hypokalemia: Secondary | ICD-10-CM | POA: Diagnosis not present

## 2015-11-28 DIAGNOSIS — Y95 Nosocomial condition: Secondary | ICD-10-CM | POA: Diagnosis not present

## 2015-11-28 LAB — BASIC METABOLIC PANEL
Anion gap: 8 (ref 5–15)
BUN: 12 mg/dL (ref 6–20)
CO2: 23 mmol/L (ref 22–32)
Calcium: 8.5 mg/dL — ABNORMAL LOW (ref 8.9–10.3)
Chloride: 105 mmol/L (ref 101–111)
Creatinine, Ser: 0.75 mg/dL (ref 0.44–1.00)
GFR calc Af Amer: >60 mL/min (ref >60)
GFR calc non Af Amer: >60 mL/min (ref >60)
Glucose, Bld: 184 mg/dL — ABNORMAL HIGH (ref 65–99)
Potassium: 3.3 mmol/L — ABNORMAL LOW (ref 3.5–5.1)
Sodium: 136 mmol/L (ref 135–145)

## 2015-11-28 LAB — CBC WITH DIFFERENTIAL/PLATELET
Basophils Absolute: 0 10*3/uL (ref 0.0–0.1)
Basophils Relative: 0 %
Eosinophils Absolute: 0 10*3/uL (ref 0.0–0.7)
Eosinophils Relative: 0 %
HCT: 31.8 % — ABNORMAL LOW (ref 36.0–46.0)
Hemoglobin: 10.7 g/dL — ABNORMAL LOW (ref 12.0–15.0)
Lymphocytes Relative: 9 %
Lymphs Abs: 1.5 10*3/uL (ref 0.7–4.0)
MCH: 29.2 pg (ref 26.0–34.0)
MCHC: 33.6 g/dL (ref 30.0–36.0)
MCV: 86.9 fL (ref 78.0–100.0)
Monocytes Absolute: 0.9 10*3/uL (ref 0.1–1.0)
Monocytes Relative: 5 %
Neutro Abs: 14.5 10*3/uL — ABNORMAL HIGH (ref 1.7–7.7)
Neutrophils Relative %: 86 %
Platelets: 283 10*3/uL (ref 150–400)
RBC: 3.66 MIL/uL — ABNORMAL LOW (ref 3.87–5.11)
RDW: 14.7 % (ref 11.5–15.5)
WBC: 16.9 10*3/uL — ABNORMAL HIGH (ref 4.0–10.5)

## 2015-11-28 LAB — MRSA PCR SCREENING: MRSA by PCR: NEGATIVE

## 2015-11-28 MED ORDER — TRAZODONE HCL 50 MG PO TABS
50.0000 mg | ORAL_TABLET | Freq: Every evening | ORAL | Status: DC | PRN
  Administered 2015-11-29 – 2015-12-02 (×4): 100 mg via ORAL
  Filled 2015-11-28 (×4): qty 2

## 2015-11-28 MED ORDER — GABAPENTIN 600 MG PO TABS
600.0000 mg | ORAL_TABLET | Freq: Three times a day (TID) | ORAL | Status: DC
  Filled 2015-11-28 (×2): qty 1

## 2015-11-28 MED ORDER — LEVOFLOXACIN IN D5W 750 MG/150ML IV SOLN
750.0000 mg | INTRAVENOUS | Status: DC
  Administered 2015-11-28 – 2015-11-30 (×3): 750 mg via INTRAVENOUS
  Filled 2015-11-28 (×3): qty 150

## 2015-11-28 MED ORDER — POTASSIUM CHLORIDE CRYS ER 20 MEQ PO TBCR
40.0000 meq | EXTENDED_RELEASE_TABLET | Freq: Once | ORAL | Status: AC
  Administered 2015-11-28: 40 meq via ORAL
  Filled 2015-11-28: qty 2

## 2015-11-28 MED ORDER — IBUPROFEN 200 MG PO TABS
600.0000 mg | ORAL_TABLET | Freq: Once | ORAL | Status: AC
  Administered 2015-11-28: 600 mg via ORAL
  Filled 2015-11-28: qty 3

## 2015-11-28 MED ORDER — LEVOFLOXACIN 500 MG PO TABS
500.0000 mg | ORAL_TABLET | Freq: Once | ORAL | Status: AC
  Administered 2015-11-28: 500 mg via ORAL
  Filled 2015-11-28: qty 1

## 2015-11-28 MED ORDER — GABAPENTIN 300 MG PO CAPS
600.0000 mg | ORAL_CAPSULE | Freq: Three times a day (TID) | ORAL | Status: DC
  Administered 2015-11-28 – 2015-12-03 (×15): 600 mg via ORAL
  Filled 2015-11-28 (×15): qty 2

## 2015-11-28 MED ORDER — ONDANSETRON HCL 4 MG/2ML IJ SOLN
4.0000 mg | Freq: Four times a day (QID) | INTRAMUSCULAR | Status: DC | PRN
  Administered 2015-11-28: 4 mg via INTRAMUSCULAR
  Filled 2015-11-28: qty 2

## 2015-11-28 MED ORDER — POTASSIUM CHLORIDE IN NACL 20-0.9 MEQ/L-% IV SOLN
INTRAVENOUS | Status: DC
  Administered 2015-11-28 – 2015-12-03 (×6): via INTRAVENOUS
  Filled 2015-11-28 (×8): qty 1000

## 2015-11-28 MED ORDER — CITALOPRAM HYDROBROMIDE 20 MG PO TABS
40.0000 mg | ORAL_TABLET | Freq: Every day | ORAL | Status: DC
  Administered 2015-11-29 – 2015-12-03 (×5): 40 mg via ORAL
  Filled 2015-11-28 (×5): qty 2

## 2015-11-28 MED ORDER — LEVETIRACETAM 500 MG PO TABS
1500.0000 mg | ORAL_TABLET | Freq: Two times a day (BID) | ORAL | Status: DC
  Administered 2015-11-29 – 2015-12-03 (×10): 1500 mg via ORAL
  Filled 2015-11-28 (×10): qty 3

## 2015-11-28 MED ORDER — QUETIAPINE FUMARATE ER 200 MG PO TB24
200.0000 mg | ORAL_TABLET | Freq: Every day | ORAL | Status: DC
  Administered 2015-11-28 – 2015-12-03 (×6): 200 mg via ORAL
  Filled 2015-11-28 (×6): qty 1

## 2015-11-28 MED ORDER — LAMOTRIGINE 100 MG PO TABS
100.0000 mg | ORAL_TABLET | Freq: Two times a day (BID) | ORAL | Status: DC
Start: 2015-11-28 — End: 2015-12-03
  Administered 2015-11-29 – 2015-12-03 (×10): 100 mg via ORAL
  Filled 2015-11-28 (×10): qty 1

## 2015-11-28 MED ORDER — VANCOMYCIN HCL 10 G IV SOLR
1500.0000 mg | Freq: Once | INTRAVENOUS | Status: AC
  Administered 2015-11-28: 1500 mg via INTRAVENOUS
  Filled 2015-11-28: qty 1500

## 2015-11-28 MED ORDER — HYDROXYZINE HCL 25 MG PO TABS: 25.0000 mg | ORAL_TABLET | Freq: Four times a day (QID) | ORAL | Status: DC | PRN

## 2015-11-28 MED ORDER — ADULT MULTIVITAMIN W/MINERALS CH
1.0000 | ORAL_TABLET | Freq: Every day | ORAL | Status: DC
  Administered 2015-11-28 – 2015-12-03 (×6): 1 via ORAL
  Filled 2015-11-28 (×6): qty 1

## 2015-11-28 MED ORDER — BENZONATATE 100 MG PO CAPS
200.0000 mg | ORAL_CAPSULE | Freq: Three times a day (TID) | ORAL | Status: DC | PRN
  Administered 2015-11-29 – 2015-12-01 (×4): 200 mg via ORAL
  Filled 2015-11-28 (×4): qty 2

## 2015-11-28 MED ORDER — PRAMIPEXOLE DIHYDROCHLORIDE 0.25 MG PO TABS
0.5000 mg | ORAL_TABLET | Freq: Two times a day (BID) | ORAL | Status: DC
  Administered 2015-11-29 – 2015-12-03 (×10): 0.5 mg via ORAL
  Filled 2015-11-28 (×10): qty 2

## 2015-11-28 MED ORDER — ENOXAPARIN SODIUM 40 MG/0.4ML ~~LOC~~ SOLN
40.0000 mg | SUBCUTANEOUS | Status: DC
  Administered 2015-11-28 – 2015-12-02 (×5): 40 mg via SUBCUTANEOUS
  Filled 2015-11-28 (×5): qty 0.4

## 2015-11-28 MED ORDER — PRAMIPEXOLE DIHYDROCHLORIDE 0.25 MG PO TABS: 0.5000 mg | ORAL_TABLET | Freq: Two times a day (BID) | ORAL | Status: DC

## 2015-11-28 MED ORDER — CLONAZEPAM 0.5 MG PO TABS
0.5000 mg | ORAL_TABLET | Freq: Every day | ORAL | Status: DC
  Administered 2015-11-29 – 2015-12-02 (×5): 0.5 mg via ORAL
  Filled 2015-11-28 (×5): qty 1

## 2015-11-28 MED ORDER — CYCLOBENZAPRINE HCL 10 MG PO TABS
10.0000 mg | ORAL_TABLET | Freq: Three times a day (TID) | ORAL | Status: DC | PRN
  Administered 2015-11-28 – 2015-12-03 (×11): 10 mg via ORAL
  Filled 2015-11-28 (×11): qty 1

## 2015-11-28 NOTE — ED Provider Notes (Signed)
[facility] DEPT Provider Note   CSN: [id number] Arrival date & time: 11/28/15  1053  By signing my name below, I, [name], attest that this documentation has been prepared under the direction and in the presence of Monica Manifold, MD. Electronically Signed: Sonum [name], Education administrator. 11/28/15. 11:16 AM.  History   Chief Complaint Chief Complaint  Patient presents with  . Cough  . Fever    The history is provided by the patient. No language interpreter was used.    HPI Comments: [name] is a 50 y.o. female with past medical history of bronchitis brought in by ambulance, who presents to the Emergency Department complaining of several days of an unchanged cough with associated intermittent left lateral chest pain. She describes her pain as a grabbing sensation and states it is worse with certain positions, cough, and deep breathing. She was seen by her PCP 2 days ago and was prescribed tessalon perles and cough syrup with codeine. She also reports some nausea. She denies sputum production, vomiting, change in appetite, ear pain. She denies history of smoking.   Past Medical History:  Diagnosis Date  . Arthritis    "knees" (04/22/2012)  . Bronchitis   . Chronic bronchitis ([place])    "yearly; when the weather changes" (04/22/2012)  . Chronic kidney disease   . Colon polyps    adenomatous and hyperplastic-  . Constipation   . Depression   . Eczema   . Epilepsy ([place])    "been having them right often here lately" (04/22/2012)  . Fatty liver   . [id number])    "1/wk" (04/22/2012)  . High cholesterol   . History of kidney stones   . Osteoarthritis    [name] 04/22/2012  . Other convulsions 05/21/12   non-epileptic spells  . Rectal bleed    in toilet- bright red  . Restless leg   . Seizures ([place])   . Vertigo     Patient Active Problem List   Diagnosis Date Noted  . Partial symptomatic epilepsy with complex partial seizures, not intractable, with status epilepticus ([place])  07/24/2015  . Insomnia w/ sleep apnea 07/24/2015  . OA (osteoarthritis) of knee 05/07/2015  . Memory loss 02/20/2014  . Moderate major depression ([place]) 10/14/2010    Past Surgical History:  Procedure Laterality Date  . ABDOMINAL HYSTERECTOMY  2001  . BLADDER SUSPENSION    . BUNIONECTOMY Left 2000  . CESAREAN SECTION  1987; 1988  . COLONOSCOPY    . JOINT REPLACEMENT    . MASS EXCISION  10/22/2011   Procedure: EXCISION MASS;  Surgeon: [name], MD;  Location: [facility];  Service: General;  Laterality: Right;  excision right buttock mass  . TOTAL HIP ARTHROPLASTY Left 1993; 1995; 2000  . TOTAL KNEE ARTHROPLASTY Left 05/07/2015   Procedure: TOTAL LEFT KNEE ARTHROPLASTY;  Surgeon: [name], MD;  Location: [facility];  Service: Orthopedics;  Laterality: Left;  . TOTAL KNEE ARTHROPLASTY Right 10/01/2015   Procedure: RIGHT TOTAL KNEE ARTHROPLASTY;  Surgeon: [name], MD;  Location: [facility];  Service: Orthopedics;  Laterality: Right;    OB History    No data available       Home Medications    Prior to Admission medications   Medication Sig Start Date End Date Taking? Authorizing Provider  benzonatate (TESSALON) 200 MG capsule Take 1 capsule (200 mg total) by mouth 3 (three) times daily as needed. 11/26/15   Sharion Balloon, FNP  citalopram (CELEXA) 40 MG tablet TAKE  ONE TABLET BY MOUTH ONCE DAILY 11/12/15   [name], MD  clonazePAM (KLONOPIN) 0.5 MG tablet Take 1 tablet (0.5 mg total) by mouth at bedtime. 09/26/15   [name], MD  cyclobenzaprine (FLEXERIL) 10 MG tablet Take 1 tablet (10 mg total) by mouth 3 (three) times daily as needed. 10/03/15   [name], PA-C  gabapentin (NEURONTIN) 600 MG tablet Take 1 tablet (600 mg total) by mouth 3 (three) times daily. 10/23/15   [name], MD  HYDROcodone-homatropine [facility]) 5-1.5 MG/5ML syrup Take 5 mLs by mouth every 8 (eight) hours as needed for cough. 11/26/15   Sharion Balloon, FNP  HYDROmorphone  (DILAUDID) 2 MG tablet Take 1-2 tablets (2-4 mg total) by mouth every 3 (three) hours as needed for severe pain. 10/03/15   [name], PA-C  hydrOXYzine (ATARAX/VISTARIL) 25 MG tablet Take 1 tablet (25 mg total) by mouth every 6 (six) hours as needed for itching. 11/21/15   [name], PA-C  lamoTRIgine (LAMICTAL) 100 MG tablet TAKE ONE TABLET BY MOUTH TWICE DAILY 09/18/15   [name], MD  levETIRAcetam (KEPPRA) 500 MG tablet Take 3 tablets PO in the morning and 3 tablets PO in the evening. 09/25/15   [name], NP  pramipexole (MIRAPEX) 0.5 MG tablet TAKE ONE TABLET BY MOUTH TWICE DAILY 11/12/15   [name], MD  predniSONE (STERAPRED UNI-PAK 21 TAB) 10 MG (21) TBPK tablet As directed x 6 days 11/21/15   [name], PA-C  QUEtiapine (SEROQUEL XR) 200 MG 24 hr tablet Take 200 mg by mouth daily. 11/21/15   Historical Provider, MD  traZODone (DESYREL) 50 MG tablet Take 1-2 tablets (50-100 mg total) by mouth at bedtime as needed for sleep. 11/21/15   [name], PA-C  triamcinolone cream (KENALOG) 0.5 % Apply 1 application topically 3 (three) times daily. 11/21/15   [name], PA-C    Family History Family History  Problem Relation Age of Onset  . Cancer Mother   . Cancer Brother   . Diabetes Brother   . Cancer Maternal Aunt   . Diabetes Maternal Aunt     Social History Social History  Substance Use Topics  . Smoking status: Never Smoker  . Smokeless tobacco: Never Used  . Alcohol use No     Allergies   Acetaminophen; Benadryl [diphenhydramine hcl]; Codeine; Dilantin [phenytoin sodium extended]; Melatonin; Ultram [tramadol hcl]; Vimpat [lacosamide]; Betadine [povidone iodine]; Latex; Penicillins; Sulfa antibiotics; Tape; and Vicodin [hydrocodone-acetaminophen]   Review of Systems Review of Systems  A complete 10 system review of systems was obtained and all systems are negative except as noted in the HPI and PMH.    Physical Exam Updated Vital  Signs There were no vitals taken for this visit.  Physical Exam  Constitutional: She is oriented to person, place, and time. She appears well-developed and well-nourished. No distress.  Tired but not toxic appearing  HENT:  Head: Normocephalic and atraumatic.  Eyes: EOM are normal.  Neck: Normal range of motion.  Cardiovascular: Regular rhythm and normal heart sounds.  Tachycardia present.   Pulmonary/Chest: Effort normal and breath sounds normal.  Abdominal: Soft. She exhibits no distension. There is no tenderness.  Musculoskeletal: Normal range of motion.  Neurological: She is alert and oriented to person, place, and time.  Skin: Skin is warm and dry.  Psychiatric: She has a normal mood and affect. Judgment normal.  Nursing note and vitals reviewed.    ED Treatments / Results  DIAGNOSTIC STUDIES:   COORDINATION OF CARE: 11:17 AM Discussed treatment plan with pt at bedside and pt agreed to plan.    Labs (all labs ordered are listed, but only abnormal results are displayed) Labs Reviewed  CBC WITH DIFFERENTIAL/PLATELET - Abnormal; Notable for the following:       Result Value   WBC 16.9 (*)    RBC 3.66 (*)    Hemoglobin 10.7 (*)    HCT 31.8 (*)    Neutro Abs 14.5 (*)    All other components within normal limits  BASIC METABOLIC PANEL - Abnormal; Notable for the following:    Potassium 3.3 (*)    Glucose, Bld 184 (*)    Calcium 8.5 (*)    All other components within normal limits  URINALYSIS, ROUTINE W REFLEX MICROSCOPIC (NOT AT [facility])    EKG  EKG Interpretation None       Radiology Dg Chest 2 View  Result Date: 11/28/2015 CLINICAL DATA:  URI symptoms, fever EXAM: CHEST  2 VIEW COMPARISON:  11/12/2013 FINDINGS: Multifocal patchy opacities in the bilateral upper lobes and left lower lobe, suspicious for pneumonia. Heart is normal in size. Mild degenerative changes of the visualized thoracolumbar spine. IMPRESSION: Multifocal patchy opacities in the bilateral  upper lobes and left lower lobe, suspicious for pneumonia. Follow-up chest radiographs are suggested in 4-6 weeks to document clearance. Electronically Signed   By: Julian Hy M.D.   On: 11/28/2015 12:52    Procedures Procedures (including critical care time)  Medications Ordered in ED Medications - No data to display   Initial Impression / Assessment and Plan / ED Course  I have reviewed the triage vital signs and the nursing notes.  Pertinent labs & imaging results that were available during my care of the patient were reviewed by me and considered in my medical decision making (see chart for details).  Clinical Course     50yF with fever, cough, dyspnea and pleuritic L sided CP. Clinically pneumonia. Imaging confirms. Nontoxic, but has new oxygen requirement. Will admit.   Final Clinical Impressions(s) / ED Diagnoses   Final diagnoses:  HCAP (healthcare-associated pneumonia)    New Prescriptions New Prescriptions   No medications on file      Monica Manifold, MD 11/28/15 1555

## 2015-11-28 NOTE — ED Notes (Signed)
Attempted to call report. Receiving nurse to call back when available.

## 2015-11-28 NOTE — ED Notes (Signed)
Sats decrease when asleep. Room air sats 89/ 90%. Patient placed on O2 2L/min [place] and sats increased to 94%

## 2015-11-28 NOTE — ED Notes (Signed)
Informed patient of the need for urine.  Patient informed this RN that she was "wet" from sweat.  This RN changed linen and patient gown.  Patient also asking for broth and a drink to assist in getting urine.  Will inquire with MD about patient drinking.

## 2015-11-28 NOTE — ED Notes (Signed)
RN starting IV 

## 2015-11-28 NOTE — H&P (Addendum)
History and Physical:    [name]   [id number] DOB: 1965/05/15 DOA: 11/28/2015  Referring MD/provider: S.Kohut, MD PCP: [name], MD   Patient coming from: Home  Chief Complaint: Cough, fever, pleuritic chest pain  History of Present Illness:   [name] is an 50 y.o. female with a PMH of bronchitis that flares up with weather changes, who presents to the ED with a one-week history of worsening cough (nonproductive), pleuritic type chest pain that worsens with deep inspiration, and fever/chills. Patient has had a + sick contact: granddaughter. She saw her PCP 11/26/15 and was diagnosed with a viral infection and prescribed Tessalon Perles. She has had no improvement in her symptoms since that time. She also reports that she was recently diagnosed with OSA but is not currently on any CPAP. She had a total right knee arthroplasty back in September.  ED Course:  The patient was given a dose of vancomycin and Levaquin in the ED. Chest x-ray showed a multifocal pneumonia. WBC was elevated at 16.9.  ROS:   Review of Systems  Constitutional: Positive for chills, fever and malaise/fatigue.  HENT: Positive for congestion. Negative for nosebleeds, sinus pain and sore throat.   Eyes: Negative for discharge and redness.  Respiratory: Positive for cough and shortness of breath. Negative for sputum production and stridor.   Cardiovascular: Positive for chest pain.       Pleuritic chest pain  Gastrointestinal: Positive for abdominal pain and nausea. Negative for blood in stool, constipation, diarrhea, heartburn, melena and vomiting.  Genitourinary: Positive for frequency.  Musculoskeletal: Positive for joint pain and myalgias.  Skin:       H/o Eczema  Neurological: Positive for dizziness, weakness and headaches.  Endo/Heme/Allergies: Negative for environmental allergies.  Psychiatric/Behavioral: Negative.     Past Medical History:   Past Medical History:  Diagnosis  Date  . Arthritis    "knees" (04/22/2012)  . Bronchitis   . Chronic bronchitis ([place])    "yearly; when the weather changes" (04/22/2012)  . Chronic kidney disease   . Colon polyps    adenomatous and hyperplastic-  . Constipation   . Depression   . Eczema   . Epilepsy ([place])    "been having them right often here lately" (04/22/2012)  . Fatty liver   . [id number])    "1/wk" (04/22/2012)  . High cholesterol   . History of kidney stones   . Osteoarthritis    [name] 04/22/2012  . Other convulsions 05/21/12   non-epileptic spells  . Rectal bleed    in toilet- bright red  . Restless leg   . Seizures ([place])   . Vertigo     Past Surgical History:   Past Surgical History:  Procedure Laterality Date  . ABDOMINAL HYSTERECTOMY  2001  . BLADDER SUSPENSION    . BUNIONECTOMY Left 2000  . CESAREAN SECTION  1987; 1988  . COLONOSCOPY    . JOINT REPLACEMENT    . MASS EXCISION  10/22/2011   Procedure: EXCISION MASS;  Surgeon: [name], MD;  Location: [facility];  Service: General;  Laterality: Right;  excision right buttock mass  . TOTAL HIP ARTHROPLASTY Left 1993; 1995; 2000  . TOTAL KNEE ARTHROPLASTY Left 05/07/2015   Procedure: TOTAL LEFT KNEE ARTHROPLASTY;  Surgeon: [name], MD;  Location: [facility];  Service: Orthopedics;  Laterality: Left;  . TOTAL KNEE ARTHROPLASTY Right 10/01/2015   Procedure: RIGHT TOTAL KNEE ARTHROPLASTY;  Surgeon: [name], MD;  Location: [facility];  Service: Orthopedics;  Laterality: Right;    Social History:   Social History   Social History  . Marital status: Married    Spouse name: [name]  . Number of children: 2  . Years of education: 12 +   Occupational History  . unemployed Other   Social History Main Topics  . Smoking status: Never Smoker  . Smokeless tobacco: Never Used  . Alcohol use No  . Drug use: No  . Sexual activity: Not Currently    Birth control/ protection: Surgical   Other Topics Concern  . Not on file   Social  History Narrative   Patient is married [name]) and lives at home with her husband.   Patient has two adult children.   Patient is disabled.   Patient has a high school education and some trade school.   Patient is right-handed.   Patient drinks four glasses of soda and tea daily.    Allergies   Acetaminophen; Benadryl [diphenhydramine hcl]; Codeine; Dilantin [phenytoin sodium extended]; Melatonin; Ultram [tramadol hcl]; Vimpat [lacosamide]; Betadine [povidone iodine]; Latex; Penicillins; Sulfa antibiotics; Tape; and Vicodin [hydrocodone-acetaminophen]  Family history:   Family History  Problem Relation Age of Onset  . Cancer Mother   . Cancer Brother   . Diabetes Brother   . Cancer Maternal Aunt   . Diabetes Maternal Aunt     Current Medications:   Prior to Admission medications   Medication Sig Start Date End Date Taking? Authorizing Provider  benzonatate (TESSALON) 200 MG capsule Take 1 capsule (200 mg total) by mouth 3 (three) times daily as needed. Patient taking differently: Take 200 mg by mouth 3 (three) times daily as needed for cough.  11/26/15  Yes Sharion Balloon, FNP  citalopram (CELEXA) 40 MG tablet TAKE ONE TABLET BY MOUTH ONCE DAILY 11/12/15  Yes [name], MD  clonazePAM (KLONOPIN) 0.5 MG tablet Take 1 tablet (0.5 mg total) by mouth at bedtime. 09/26/15  Yes [name], MD  cyclobenzaprine (FLEXERIL) 10 MG tablet Take 1 tablet (10 mg total) by mouth 3 (three) times daily as needed. Patient taking differently: Take 10 mg by mouth 3 (three) times daily as needed for muscle spasms.  10/03/15  Yes [name], PA-C  gabapentin (NEURONTIN) 600 MG tablet Take 1 tablet (600 mg total) by mouth 3 (three) times daily. 10/23/15  Yes [name], MD  hydrOXYzine (ATARAX/VISTARIL) 25 MG tablet Take 1 tablet (25 mg total) by mouth every 6 (six) hours as needed for itching. 11/21/15  Yes [name], PA-C  lamoTRIgine (LAMICTAL) 100 MG tablet TAKE ONE TABLET BY  MOUTH TWICE DAILY 09/18/15  Yes [name], MD  levETIRAcetam (KEPPRA) 500 MG tablet Take 3 tablets PO in the morning and 3 tablets PO in the evening. Patient taking differently: Take 1,500 mg by mouth 2 (two) times daily. Take 3 tablets PO in the morning and 3 tablets PO in the evening. 09/25/15  Yes [name], NP  Multiple Vitamin (MULTIVITAMIN WITH MINERALS) TABS tablet Take 1 tablet by mouth daily.   Yes Historical Provider, MD  pramipexole (MIRAPEX) 0.5 MG tablet TAKE ONE TABLET BY MOUTH TWICE DAILY 11/12/15  Yes [name], MD  QUEtiapine (SEROQUEL XR) 200 MG 24 hr tablet Take 200 mg by mouth daily. 11/21/15  Yes Historical Provider, MD  traZODone (DESYREL) 50 MG tablet Take 1-2 tablets (50-100 mg total) by mouth at bedtime as needed for sleep. 11/21/15  Yes [name], PA-C  triamcinolone  cream (KENALOG) 0.5 % Apply 1 application topically 3 (three) times daily. 11/21/15  Yes [name], PA-C  HYDROcodone-homatropine (HYCODAN) 5-1.5 MG/5ML syrup Take 5 mLs by mouth every 8 (eight) hours as needed for cough. Patient not taking: Reported on 11/28/2015 11/26/15   Sharion Balloon, FNP  HYDROmorphone (DILAUDID) 2 MG tablet Take 1-2 tablets (2-4 mg total) by mouth every 3 (three) hours as needed for severe pain. Patient not taking: Reported on 11/28/2015 10/03/15   Alexzandrew L Dara Lords, PA-C  predniSONE (STERAPRED UNI-PAK 21 TAB) 10 MG (21) TBPK tablet As directed x 6 days Patient not taking: Reported on 11/28/2015 11/21/15   [name], PA-C    Physical Exam:   Vitals:   11/28/15 1200 11/28/15 1216 11/28/15 1443 11/28/15 1500  BP: 101/60  115/82 125/100  Pulse: 113  99 98  Resp: (!) 33  25 26  Temp:  100.7 F (38.2 C)    SpO2: 95%  90% 92%     Physical Exam: Blood pressure 125/100, pulse 98, temperature 100.7 F (38.2 C), resp. rate 26, SpO2 92 %. Gen: No acute distress. Head: Normocephalic, atraumatic. Eyes: Pupils equal, round and reactive to light. Extraocular  movements intact.  Sclerae nonicteric. No lid lag. Mouth: Oropharynx reveals Dry mucous membranes. Dentition is fair. Neck: Supple, no thyromegaly, no lymphadenopathy, no jugular venous distention. Chest: Lungs are diminished to auscultation. Tachypneic. No wheezes or rhonchi appreciated. CV: Heart sounds are regular with an S1, S2, mildly tachycardic. No murmurs, rubs, clicks, or gallops.  Abdomen: Soft, nontender, nondistended with normal active bowel sounds. No hepatosplenomegaly or palpable masses. Extremities: Extremities are without clubbing, edema, or cyanosis. Pedal pulses 2+.  Skin: Warm and dry. No rashes, lesions or wounds. Neuro: Alert and oriented times 3; grossly nonfocal.  Psych: Insight is good and judgment is appropriate. Mood and affect normal.   Data Review:    Labs: Basic Metabolic Panel:  Recent Labs Lab 11/28/15 1202  NA 136  K 3.3*  CL 105  CO2 23  GLUCOSE 184*  BUN 12  CREATININE 0.75  CALCIUM 8.5*   Liver Function Tests: No results for input(s): AST, ALT, ALKPHOS, BILITOT, PROT, ALBUMIN in the last 168 hours. No results for input(s): LIPASE, AMYLASE in the last 168 hours. No results for input(s): AMMONIA in the last 168 hours. CBC:  Recent Labs Lab 11/28/15 1202  WBC 16.9*  NEUTROABS 14.5*  HGB 10.7*  HCT 31.8*  MCV 86.9  PLT 283   Cardiac Enzymes: No results for input(s): CKTOTAL, CKMB, CKMBINDEX, TROPONINI in the last 168 hours.  BNP (last 3 results) No results for input(s): PROBNP in the last 8760 hours. CBG: No results for input(s): GLUCAP in the last 168 hours.  Urinalysis    Component Value Date/Time   COLORURINE YELLOW 09/25/2015 1430   APPEARANCEUR CLEAR 09/25/2015 1430   APPEARANCEUR Clear 04/09/2015 1101   LABSPEC 1.008 09/25/2015 1430   PHURINE 6.0 09/25/2015 1430   GLUCOSEU NEGATIVE 09/25/2015 1430   HGBUR NEGATIVE 09/25/2015 1430   BILIRUBINUR NEGATIVE 09/25/2015 1430   BILIRUBINUR Negative 04/09/2015 1101    KETONESUR NEGATIVE 09/25/2015 1430   PROTEINUR NEGATIVE 09/25/2015 1430   UROBILINOGEN negative 04/19/2014 1332   UROBILINOGEN 0.2 12/04/2013 1414   NITRITE NEGATIVE 09/25/2015 1430   LEUKOCYTESUR NEGATIVE 09/25/2015 1430   LEUKOCYTESUR Negative 04/09/2015 1101      Radiographic Studies: Dg Chest 2 View  Result Date: 11/28/2015 CLINICAL DATA:  URI symptoms, fever EXAM: CHEST  2  VIEW COMPARISON:  11/12/2013 FINDINGS: Multifocal patchy opacities in the bilateral upper lobes and left lower lobe, suspicious for pneumonia. Heart is normal in size. Mild degenerative changes of the visualized thoracolumbar spine. IMPRESSION: Multifocal patchy opacities in the bilateral upper lobes and left lower lobe, suspicious for pneumonia. Follow-up chest radiographs are suggested in 4-6 weeks to document clearance. Electronically Signed   By: Julian Hy M.D.   On: 11/28/2015 12:52    EKG: None done.   Assessment/Plan:   Principal Problem:   Multifocal pneumonia/possible HCAP Although the patient has been hospitalized within the past 90 days, this likely is a community-acquired pneumonia given her positive sick contact. It is most likely viral in etiology, with possible secondary superinfection. Would treat with Levaquin for now and if she does not improve within the next 24 hours, consider broadening her antibiotic coverage. Check strep pneumonia antigen/Legionella antigen. Check sputum cultures and blood cultures. Check respiratory virus panel. Check to see if she has a MRSA carrier. She had an influenza panel done at her recent doctor's office visit, which was negative. Maintain droplet precautions for now.  Active Problems:   Hypokalemia We will add potassium to IV fluids given nausea.    Hyperglycemia Check hemoglobin A1c.    Moderate major depression ([place]) On multiple psychotropics. Continue usual home medications including Celexa, Klonopin, Mirapex, Seroquel and trazodone.  Controlled.    Partial symptomatic epilepsy with complex partial seizures, not intractable, with status epilepticus (HCC) Continue Keppra and Lamictal.     Insomnia w/ sleep apnea Supplemental oxygen ordered.   Attestation regarding necessity of inpatient status:   The appropriate admission status for this patient is INPATIENT. Inpatient status is judged to be reasonable and necessary in order to provide the required intensity of service to ensure the patient's safety. The patient's presenting symptoms, physical exam findings, and initial radiographic and laboratory data in the context of their chronic comorbidities is felt to place them at high risk for further clinical deterioration. Furthermore, it is not anticipated that the patient will be medically stable for discharge from the hospital within 2 midnights of admission. The following factors support the admission status of inpatient.    The patient's presenting symptoms include pleuritic chest pain, shortness of breath, cough, fever and nausea making oral antibiotic therapy unfeasible at present.  The worrisome physical exam findings include tachypnea, mild tachycardia, mild hypoxia.  The initial radiographic and laboratory data are worrisome because of multifocal pneumonia with elevated WBC.  The chronic co-morbidities include OSA, recent hospitalization within 90 days placing her at risk for HCAP with need to monitor in the inpatient setting for clinical deterioration.   * I certify that at the point of admission it is my clinical judgment that the patient will require inpatient hospital care spanning beyond 2 midnights from the point of admission due to high intensity of service, high risk for further deterioration and high frequency of surveillance required.*     Other information:   DVT prophylaxis: Lovenox ordered. Code Status: Full code. Family Communication: No family presently at the bedside.  Disposition Plan: Home in  48-72 hours if stable. Consults called: None. Admission status: Inpatient.  The medical decision making on this patient was of high complexity and the patient is at high risk for clinical deterioration, therefore this is a level 3 visit.  The medical decision making is of moderate complexity, therefore this is a level 2 visit.  Armando Bukhari [facility] Pager [phone] Cell: [phone]   If 7PM-7AM,  please contact night-coverage [email or web address] Password [email or web address] 11/28/2015, 4:55 PM

## 2015-11-28 NOTE — Telephone Encounter (Signed)
phone call regarding appointment, line busy.  will try again later time.

## 2015-11-28 NOTE — Telephone Encounter (Signed)
Tried to contact patient. Line busy x 2

## 2015-11-28 NOTE — Progress Notes (Signed)
RN received report. Pt arrived unit from ED. Alert and oriented, will continue with current plan of care.

## 2015-11-28 NOTE — ED Triage Notes (Signed)
Per EMS- Patient reports that she has had URI symptoms x 4 days and went to her primary physician 2 days ago and was diagnosed with URI. Today, patient has a fever 103.1 and was given Tylenol 1000 mg per EMS prior to arrival to the ED. Patient had an appointment with her primary today at 1500, but deciced to come to the ED.

## 2015-11-28 NOTE — ED Notes (Signed)
Asked pt to provide urine specimen. Pt states " I've tried twice." and states she still can not go to restroom.

## 2015-11-28 NOTE — ED Notes (Signed)
Patient assisted to the bathroom and stated that she could not go.

## 2015-11-28 NOTE — ED Notes (Signed)
Bed: WA06 Expected date:  Expected time:  Means of arrival:  Comments: EMS 50 yo F, cough/fever

## 2015-11-28 NOTE — ED Notes (Signed)
Per MD patient can eat.  Patient given chicken broth and sprite.

## 2015-11-29 DIAGNOSIS — J189 Pneumonia, unspecified organism: Secondary | ICD-10-CM | POA: Diagnosis not present

## 2015-11-29 LAB — RESPIRATORY PANEL BY PCR

## 2015-11-29 LAB — HEMOGLOBIN A1C
Hgb A1c MFr Bld: 5.4 % (ref 4.8–5.6)
Mean Plasma Glucose: 108 mg/dL

## 2015-11-29 LAB — STREP PNEUMONIAE URINARY ANTIGEN: Strep Pneumo Urinary Antigen: NEGATIVE

## 2015-11-29 LAB — HIV ANTIBODY (ROUTINE TESTING W REFLEX): HIV Screen 4th Generation wRfx: NONREACTIVE

## 2015-11-29 MED ORDER — KETOROLAC TROMETHAMINE 15 MG/ML IJ SOLN
7.5000 mg | Freq: Four times a day (QID) | INTRAMUSCULAR | Status: AC
  Administered 2015-11-29 – 2015-12-02 (×12): 7.5 mg via INTRAVENOUS
  Filled 2015-11-29 (×11): qty 1

## 2015-11-29 MED ORDER — GUAIFENESIN ER 600 MG PO TB12
600.0000 mg | ORAL_TABLET | Freq: Two times a day (BID) | ORAL | Status: DC
  Administered 2015-11-29 – 2015-12-03 (×8): 600 mg via ORAL
  Filled 2015-11-29 (×8): qty 1

## 2015-11-29 MED ORDER — KETOROLAC TROMETHAMINE 15 MG/ML IJ SOLN
7.5000 mg | Freq: Four times a day (QID) | INTRAMUSCULAR | Status: DC
  Filled 2015-11-29: qty 1

## 2015-11-29 NOTE — Progress Notes (Signed)
PROGRESS NOTE  [name] [id number] DOB: [birth year] DOA: 11/28/2015 PCP: [name], MD  HPI/Recap of past 24 hours:  Report left sided rib pain, over all feeling better,  fever subsided, heart rate /RR/ blood pressure also have improved  Assessment/Plan: Principal Problem:   Multifocal pneumonia Active Problems:   Moderate major depression (HCC)   Partial symptomatic epilepsy with complex partial seizures, not intractable, with status epilepticus ([place])   Insomnia w/ sleep apnea   HCAP (healthcare-associated pneumonia)  Sepsis presented on admission with fever, hypotension, tachycardia, tachypnea, hypoxia, leukocytosis,   mrsa screening negative, blood and sputumCulture pending, Improving,   Acute hypoxic respiratory failure due to bilateral multifocal pna Culture pending, viral panel negative  continue abx , ivf, wean oxgyen as tolerated  Left sided rib pain likely from underline pna ; schedule iv toradol, monitor effect  Active Problems:   Hypokalemia We will add potassium to IV fluids given nausea.    Hyperglycemia Check hemoglobin A1c.    Moderate major depression ([place]) On multiple psychotropics. Continue usual home medications including Celexa, Klonopin, Mirapex, Seroquel and trazodone. Controlled.    Partial symptomatic epilepsy with complex partial seizures, not intractable, with status epilepticus (HCC) Continue Keppra and Lamictal.     Insomnia w/ sleep apnea Supplemental oxygen ordered.   Code Status: full  Family Communication: patient   Disposition Plan: home in a few days   Consultants:  none  Procedures:  none  Antibiotics:  levaquin   Objective: BP 109/64 (BP Location: Left Arm)   Pulse 97   Temp 98.2 F (36.8 C) (Oral)   Resp (!) 24   SpO2 97%   Intake/Output Summary (Last 24 hours) at 11/29/15 1906 Last data filed at 11/29/15 1045  Gross per 24 hour  Intake          1777.75 ml  Output             2050 ml    Net          -272.25 ml   There were no vitals filed for this visit.  Exam:   General:  NAD  Cardiovascular: RRR  Respiratory: diminished at basis, some crackles  Abdomen: Soft/ND/NT, positive BS  Musculoskeletal: No Edema  Neuro: aaox3  Data Reviewed: Basic Metabolic Panel:  Recent Labs Lab 11/28/15 1202  NA 136  K 3.3*  CL 105  CO2 23  GLUCOSE 184*  BUN 12  CREATININE 0.75  CALCIUM 8.5*   Liver Function Tests: No results for input(s): AST, ALT, ALKPHOS, BILITOT, PROT, ALBUMIN in the last 168 hours. No results for input(s): LIPASE, AMYLASE in the last 168 hours. No results for input(s): AMMONIA in the last 168 hours. CBC:  Recent Labs Lab 11/28/15 1202  WBC 16.9*  NEUTROABS 14.5*  HGB 10.7*  HCT 31.8*  MCV 86.9  PLT 283   Cardiac Enzymes:   No results for input(s): CKTOTAL, CKMB, CKMBINDEX, TROPONINI in the last 168 hours. BNP (last 3 results) No results for input(s): BNP in the last 8760 hours.  ProBNP (last 3 results) No results for input(s): PROBNP in the last 8760 hours.  CBG: No results for input(s): GLUCAP in the last 168 hours.  Recent Results (from the past 240 hour(s))  Veritor Flu A/B Waived     Status: None   Collection Time: 11/26/15 11:43 AM  Result Value Ref Range Status   Influenza A Negative Negative Final   Influenza B Negative Negative Final    Comment: If the  test is negative for the presence of influenza A or influenza B antigen, infection due to influenza cannot be ruled-out because the antigen present in the sample may be below the detection limit of the test. It is recommended that these results be confirmed by viral culture or an FDA-cleared influenza A and B molecular assay.   Respiratory Panel by PCR     Status: None   Collection Time: 11/28/15  4:50 PM  Result Value Ref Range Status   Adenovirus NOT DETECTED NOT DETECTED Final   Coronavirus 229E NOT DETECTED NOT DETECTED Final   Coronavirus HKU1 NOT DETECTED  NOT DETECTED Final   Coronavirus NL63 NOT DETECTED NOT DETECTED Final   Coronavirus OC43 NOT DETECTED NOT DETECTED Final   Metapneumovirus NOT DETECTED NOT DETECTED Final   Rhinovirus / Enterovirus NOT DETECTED NOT DETECTED Final   Influenza A NOT DETECTED NOT DETECTED Final   Influenza B NOT DETECTED NOT DETECTED Final   Parainfluenza Virus 1 NOT DETECTED NOT DETECTED Final   Parainfluenza Virus 2 NOT DETECTED NOT DETECTED Final   Parainfluenza Virus 3 NOT DETECTED NOT DETECTED Final   Parainfluenza Virus 4 NOT DETECTED NOT DETECTED Final   Respiratory Syncytial Virus NOT DETECTED NOT DETECTED Final   Bordetella pertussis NOT DETECTED NOT DETECTED Final   Chlamydophila pneumoniae NOT DETECTED NOT DETECTED Final   Mycoplasma pneumoniae NOT DETECTED NOT DETECTED Final    Comment: Performed at [facility]  MRSA PCR Screening     Status: None   Collection Time: 11/28/15  5:00 PM  Result Value Ref Range Status   MRSA by PCR NEGATIVE NEGATIVE Final    Comment:        The GeneXpert MRSA Assay (FDA approved for NASAL specimens only), is one component of a comprehensive MRSA colonization surveillance program. It is not intended to diagnose MRSA infection nor to guide or monitor treatment for MRSA infections.   Culture, blood (routine x 2) Call MD if unable to obtain prior to antibiotics being given     Status: None (Preliminary result)   Collection Time: 11/28/15  6:24 PM  Result Value Ref Range Status   Specimen Description BLOOD RIGHT HAND  Final   Special Requests BOTTLES DRAWN AEROBIC AND ANAEROBIC 10CC  Final   Culture   Final    NO GROWTH < 24 HOURS Performed at [facility]    Report Status PENDING  Incomplete  Culture, blood (routine x 2) Call MD if unable to obtain prior to antibiotics being given     Status: None (Preliminary result)   Collection Time: 11/28/15  6:24 PM  Result Value Ref Range Status   Specimen Description BLOOD LEFT ARM  Final   Special  Requests BOTTLES DRAWN AEROBIC AND ANAEROBIC [place]  Final   Culture   Final    NO GROWTH < 24 HOURS Performed at [facility]    Report Status PENDING  Incomplete     Studies: No results found.  Scheduled Meds: . citalopram  40 mg Oral Daily  . clonazePAM  0.5 mg Oral QHS  . enoxaparin (LOVENOX) injection  40 mg Subcutaneous Q24H  . gabapentin  600 mg Oral TID  . ketorolac  7.5 mg Intravenous Q6H  . lamoTRIgine  100 mg Oral BID  . levETIRAcetam  1,500 mg Oral BID  . levofloxacin (LEVAQUIN) IV  750 mg Intravenous Q24H  . multivitamin with minerals  1 tablet Oral Daily  . pramipexole  0.5 mg Oral  BID  . QUEtiapine  200 mg Oral Daily    Continuous Infusions: . 0.9 % NaCl with KCl 20 mEq / L 75 mL/hr at 11/29/15 1016     Time spent: 25 mins  Neils Siracusa MD, PhD  [facility] Pager [phone]. If 7PM-7AM, please contact night-coverage at [email or web address], password [facility] 11/29/2015, 7:06 PM  LOS: 1 day

## 2015-11-30 DIAGNOSIS — J189 Pneumonia, unspecified organism: Secondary | ICD-10-CM | POA: Diagnosis not present

## 2015-11-30 DIAGNOSIS — F321 Major depressive disorder, single episode, moderate: Secondary | ICD-10-CM | POA: Diagnosis not present

## 2015-11-30 DIAGNOSIS — G4730 Sleep apnea, unspecified: Secondary | ICD-10-CM | POA: Diagnosis not present

## 2015-11-30 DIAGNOSIS — G4700 Insomnia, unspecified: Secondary | ICD-10-CM | POA: Diagnosis not present

## 2015-11-30 LAB — BASIC METABOLIC PANEL
Anion gap: 5 (ref 5–15)
BUN: 14 mg/dL (ref 6–20)
CO2: 25 mmol/L (ref 22–32)
Calcium: 8.4 mg/dL — ABNORMAL LOW (ref 8.9–10.3)
Chloride: 109 mmol/L (ref 101–111)
Creatinine, Ser: 0.67 mg/dL (ref 0.44–1.00)
GFR calc Af Amer: >60 mL/min (ref >60)
GFR calc non Af Amer: >60 mL/min (ref >60)
Glucose, Bld: 92 mg/dL (ref 65–99)
Potassium: 4.2 mmol/L (ref 3.5–5.1)
Sodium: 139 mmol/L (ref 135–145)

## 2015-11-30 LAB — MAGNESIUM: Magnesium: 2.1 mg/dL (ref 1.7–2.4)

## 2015-11-30 LAB — EXPECTORATED SPUTUM ASSESSMENT W REFEX TO RESP CULTURE

## 2015-11-30 LAB — CBC
HCT: 32.9 % — ABNORMAL LOW (ref 36.0–46.0)
Hemoglobin: 10.9 g/dL — ABNORMAL LOW (ref 12.0–15.0)
MCH: 29.1 pg (ref 26.0–34.0)
MCHC: 33.1 g/dL (ref 30.0–36.0)
MCV: 88 fL (ref 78.0–100.0)
Platelets: 271 10*3/uL (ref 150–400)
RBC: 3.74 MIL/uL — ABNORMAL LOW (ref 3.87–5.11)
RDW: 15.1 % (ref 11.5–15.5)
WBC: 14.3 10*3/uL — ABNORMAL HIGH (ref 4.0–10.5)

## 2015-11-30 LAB — EXPECTORATED SPUTUM ASSESSMENT W GRAM STAIN, RFLX TO RESP C

## 2015-11-30 LAB — LEGIONELLA PNEUMOPHILA SEROGP 1 UR AG: L. pneumophila Serogp 1 Ur Ag: NEGATIVE

## 2015-11-30 LAB — GLUCOSE, CAPILLARY: Glucose-Capillary: 65 mg/dL (ref 65–99)

## 2015-11-30 NOTE — Telephone Encounter (Signed)
Tried to contact patient, reported that she is in hospital with pneumonia.

## 2015-11-30 NOTE — Progress Notes (Signed)
PROGRESS NOTE  [name] [id number] DOB: Oct 03, 1965 DOA: 11/28/2015 PCP: [name], MD  HPI/Recap of past 24 hours:  Febrile overnight but  Breathing better.   Assessment/Plan: Principal Problem:   Multifocal pneumonia Active Problems:   Moderate major depression (HCC)   Partial symptomatic epilepsy with complex partial seizures, not intractable, with status epilepticus ([place])   Insomnia w/ sleep apnea   HCAP (healthcare-associated pneumonia)  Sepsis presented on admission with fever, hypotension, tachycardia, tachypnea, hypoxia, leukocytosis,   mrsa screening negative, blood and sputumCulture pending, Improving,  Continues to have fever and chills.  Influenza pcr negative.  HIV negative.  Strep pneumo negative.  Legionella pending.    Acute hypoxic respiratory failure due to bilateral multifocal pna Culture pending, viral panel negative  continue abx , weaned her off the oxygen.   Left sided rib pain likely from underline pna ; schedule iv toradol, rib pain much improved.   Active Problems:   Hypokalemia repleted as needed.     Hyperglycemia CBG (last 3)  No results for input(s): GLUCAP in the last 72 hours. hgba1c is 5.4     Moderate major depression ([place]) On multiple psychotropics. Continue usual home medications including Celexa, Klonopin, Mirapex, Seroquel and trazodone. Controlled.    Partial symptomatic epilepsy with complex partial seizures, not intractable, with status epilepticus (HCC) Continue Keppra and Lamictal.     Insomnia w/ sleep apnea Supplemental oxygen ordered.   Code Status: full  Family Communication: patient   Disposition Plan: home tomorrow if afebrile.   Consultants:  none  Procedures:  none  Antibiotics:  levaquin   Objective: BP 120/73 (BP Location: Left Arm)   Pulse 96   Temp 98.5 F (36.9 C) (Oral)   Resp 18   SpO2 98%   Intake/Output Summary (Last 24 hours) at 11/30/15 1535 Last data filed  at 11/30/15 1300  Gross per 24 hour  Intake              480 ml  Output                0 ml  Net              480 ml   There were no vitals filed for this visit.  Exam:   General:  NAD  Cardiovascular: RRR  Respiratory: diminished at basis, some crackles  Abdomen: Soft/ND/NT, positive BS  Musculoskeletal: No Edema  Neuro: aaox3  Data Reviewed: Basic Metabolic Panel:  Recent Labs Lab 11/28/15 1202 11/30/15 0513  NA 136 139  K 3.3* 4.2  CL 105 109  CO2 23 25  GLUCOSE 184* 92  BUN 12 14  CREATININE 0.75 0.67  CALCIUM 8.5* 8.4*  MG  --  2.1   Liver Function Tests: No results for input(s): AST, ALT, ALKPHOS, BILITOT, PROT, ALBUMIN in the last 168 hours. No results for input(s): LIPASE, AMYLASE in the last 168 hours. No results for input(s): AMMONIA in the last 168 hours. CBC:  Recent Labs Lab 11/28/15 1202 11/30/15 0513  WBC 16.9* 14.3*  NEUTROABS 14.5*  --   HGB 10.7* 10.9*  HCT 31.8* 32.9*  MCV 86.9 88.0  PLT 283 271   Cardiac Enzymes:   No results for input(s): CKTOTAL, CKMB, CKMBINDEX, TROPONINI in the last 168 hours. BNP (last 3 results) No results for input(s): BNP in the last 8760 hours.  ProBNP (last 3 results) No results for input(s): PROBNP in the last 8760 hours.  CBG: No results for input(s): GLUCAP  in the last 168 hours.  Recent Results (from the past 240 hour(s))  Veritor Flu A/B Waived     Status: None   Collection Time: 11/26/15 11:43 AM  Result Value Ref Range Status   Influenza A Negative Negative Final   Influenza B Negative Negative Final    Comment: If the test is negative for the presence of influenza A or influenza B antigen, infection due to influenza cannot be ruled-out because the antigen present in the sample may be below the detection limit of the test. It is recommended that these results be confirmed by viral culture or an FDA-cleared influenza A and B molecular assay.   Respiratory Panel by PCR     Status: None     Collection Time: 11/28/15  4:50 PM  Result Value Ref Range Status   Adenovirus NOT DETECTED NOT DETECTED Final   Coronavirus 229E NOT DETECTED NOT DETECTED Final   Coronavirus HKU1 NOT DETECTED NOT DETECTED Final   Coronavirus NL63 NOT DETECTED NOT DETECTED Final   Coronavirus OC43 NOT DETECTED NOT DETECTED Final   Metapneumovirus NOT DETECTED NOT DETECTED Final   Rhinovirus / Enterovirus NOT DETECTED NOT DETECTED Final   Influenza A NOT DETECTED NOT DETECTED Final   Influenza B NOT DETECTED NOT DETECTED Final   Parainfluenza Virus 1 NOT DETECTED NOT DETECTED Final   Parainfluenza Virus 2 NOT DETECTED NOT DETECTED Final   Parainfluenza Virus 3 NOT DETECTED NOT DETECTED Final   Parainfluenza Virus 4 NOT DETECTED NOT DETECTED Final   Respiratory Syncytial Virus NOT DETECTED NOT DETECTED Final   Bordetella pertussis NOT DETECTED NOT DETECTED Final   Chlamydophila pneumoniae NOT DETECTED NOT DETECTED Final   Mycoplasma pneumoniae NOT DETECTED NOT DETECTED Final    Comment: Performed at [facility]  MRSA PCR Screening     Status: None   Collection Time: 11/28/15  5:00 PM  Result Value Ref Range Status   MRSA by PCR NEGATIVE NEGATIVE Final    Comment:        The GeneXpert MRSA Assay (FDA approved for NASAL specimens only), is one component of a comprehensive MRSA colonization surveillance program. It is not intended to diagnose MRSA infection nor to guide or monitor treatment for MRSA infections.   Culture, blood (routine x 2) Call MD if unable to obtain prior to antibiotics being given     Status: None (Preliminary result)   Collection Time: 11/28/15  6:24 PM  Result Value Ref Range Status   Specimen Description BLOOD RIGHT HAND  Final   Special Requests BOTTLES DRAWN AEROBIC AND ANAEROBIC 10CC  Final   Culture   Final    NO GROWTH 2 DAYS Performed at [facility]    Report Status PENDING  Incomplete  Culture, blood (routine x 2) Call MD if unable to obtain  prior to antibiotics being given     Status: None (Preliminary result)   Collection Time: 11/28/15  6:24 PM  Result Value Ref Range Status   Specimen Description BLOOD LEFT ARM  Final   Special Requests BOTTLES DRAWN AEROBIC AND ANAEROBIC [place]  Final   Culture   Final    NO GROWTH 2 DAYS Performed at [facility]    Report Status PENDING  Incomplete  Culture, sputum-assessment     Status: None   Collection Time: 11/30/15 11:27 AM  Result Value Ref Range Status   Specimen Description SPUTUM  Final   Special Requests NONE  Final   Sputum  evaluation THIS SPECIMEN IS ACCEPTABLE FOR SPUTUM CULTURE  Final   Report Status 11/30/2015 FINAL  Final     Studies: No results found.  Scheduled Meds: . citalopram  40 mg Oral Daily  . clonazePAM  0.5 mg Oral QHS  . enoxaparin (LOVENOX) injection  40 mg Subcutaneous Q24H  . gabapentin  600 mg Oral TID  . guaiFENesin  600 mg Oral BID  . ketorolac  7.5 mg Intravenous Q6H  . lamoTRIgine  100 mg Oral BID  . levETIRAcetam  1,500 mg Oral BID  . levofloxacin (LEVAQUIN) IV  750 mg Intravenous Q24H  . multivitamin with minerals  1 tablet Oral Daily  . pramipexole  0.5 mg Oral BID  . QUEtiapine  200 mg Oral Daily    Continuous Infusions: . 0.9 % NaCl with KCl 20 mEq / L 75 mL/hr at 11/29/15 1016     Time spent: 25 mins  Madalen Gavin MD, [facility] Pager [phone]. If 7PM-7AM, please contact night-coverage at [email or web address], password [email or web address] 11/30/2015, 3:35 PM  LOS: 2 days

## 2015-12-01 ENCOUNTER — Inpatient Hospital Stay (HOSPITAL_COMMUNITY): Payer: PPO

## 2015-12-01 DIAGNOSIS — J189 Pneumonia, unspecified organism: Secondary | ICD-10-CM | POA: Diagnosis not present

## 2015-12-01 DIAGNOSIS — F321 Major depressive disorder, single episode, moderate: Secondary | ICD-10-CM | POA: Diagnosis not present

## 2015-12-01 DIAGNOSIS — R05 Cough: Secondary | ICD-10-CM | POA: Diagnosis not present

## 2015-12-01 DIAGNOSIS — G4700 Insomnia, unspecified: Secondary | ICD-10-CM | POA: Diagnosis not present

## 2015-12-01 DIAGNOSIS — G4730 Sleep apnea, unspecified: Secondary | ICD-10-CM | POA: Diagnosis not present

## 2015-12-01 LAB — URINALYSIS, ROUTINE W REFLEX MICROSCOPIC
Bilirubin Urine: NEGATIVE
Glucose, UA: NEGATIVE mg/dL
Hgb urine dipstick: NEGATIVE
Ketones, ur: NEGATIVE mg/dL
Leukocytes, UA: NEGATIVE
Nitrite: NEGATIVE
Protein, ur: NEGATIVE mg/dL
Specific Gravity, Urine: 1.013 (ref 1.005–1.030)
pH: 6.5 (ref 5.0–8.0)

## 2015-12-01 LAB — CBC
HCT: 31.5 % — ABNORMAL LOW (ref 36.0–46.0)
Hemoglobin: 10.6 g/dL — ABNORMAL LOW (ref 12.0–15.0)
MCH: 29 pg (ref 26.0–34.0)
MCHC: 33.7 g/dL (ref 30.0–36.0)
MCV: 86.3 fL (ref 78.0–100.0)
Platelets: 261 10*3/uL (ref 150–400)
RBC: 3.65 MIL/uL — ABNORMAL LOW (ref 3.87–5.11)
RDW: 14.7 % (ref 11.5–15.5)
WBC: 10.3 10*3/uL (ref 4.0–10.5)

## 2015-12-01 MED ORDER — VANCOMYCIN HCL IN DEXTROSE 1-5 GM/200ML-% IV SOLN
1000.0000 mg | Freq: Two times a day (BID) | INTRAVENOUS | Status: DC
  Administered 2015-12-01 – 2015-12-03 (×4): 1000 mg via INTRAVENOUS
  Filled 2015-12-01 (×4): qty 200

## 2015-12-01 MED ORDER — LEVOFLOXACIN 750 MG PO TABS: 750.0000 mg | ORAL_TABLET | Freq: Every evening | ORAL | Status: DC

## 2015-12-01 MED ORDER — DEXTROSE 5 % IV SOLN
1.0000 g | Freq: Three times a day (TID) | INTRAVENOUS | Status: DC
  Administered 2015-12-01 – 2015-12-03 (×6): 1 g via INTRAVENOUS
  Filled 2015-12-01 (×6): qty 1

## 2015-12-01 NOTE — Progress Notes (Signed)
PHARMACIST - PHYSICIAN COMMUNICATION DR:   Karleen Hampshire CONCERNING: Antibiotic IV to Oral Route Change Policy  RECOMMENDATION: This patient is receiving Levaquin by the intravenous route.  Based on criteria approved by the Pharmacy and Therapeutics Committee, the antibiotic(s) is/are being converted to the equivalent oral dose form(s).   DESCRIPTION: These criteria include:  Patient being treated for a respiratory tract infection, urinary tract infection, cellulitis or clostridium difficile associated diarrhea if on metronidazole  The patient is not neutropenic and does not exhibit a GI malabsorption state  The patient is eating (either orally or via tube) and/or has been taking other orally administered medications for a least 24 hours  The patient is improving clinically and has a Tmax < 100.5  If you have questions about this conversion, please contact the Pharmacy Department  []   [phone] )  [facility] []   [phone] )  [facility] []   [phone] )  [facility] []   [phone] )  [facility] [x]   [phone] )  [place], PharmD, BCPS Pager: [phone] 12/01/2015@9 :46 AM

## 2015-12-01 NOTE — Progress Notes (Signed)
Pharmacy Antibiotic Note  [name] is a 50 y.o. female admitted on 11/28/2015 with pneumonia.  Pharmacy has been consulted for vanc/cefepime dosing.  Plan: Vancomycin vancomycin  IV every 12 hours.  Goal trough 15-20 mcg/mL. The dose of cefepime will be adjusted to 1g q8 based on renal function.  Height: 5' (152.4 cm) Weight: 163 lb 9.3 oz (74.2 kg) IBW/kg (Calculated) : 45.5  Temp (24hrs), Avg:99.3 F (37.4 C), Min:97.7 F (36.5 C), Max:100.9 F (38.3 C)   Recent Labs Lab 11/28/15 1202 11/30/15 0513 12/01/15 0523  WBC 16.9* 14.3* 10.3  CREATININE 0.75 0.67  --     Estimated Creatinine Clearance: 75.7 mL/min (by C-G formula based on SCr of 0.67 mg/dL).    Allergies  Allergen Reactions  . Acetaminophen Other (See Comments)    Has fatty deposits on liver  . Benadryl [Diphenhydramine Hcl] Other (See Comments)    hyperactivity and seizures  . Codeine Itching and Rash    seizures  . Dilantin [Phenytoin Sodium Extended] Other (See Comments)    Elevated LFT's  . Melatonin Other (See Comments)    seizures  . Ultram [Tramadol Hcl] Other (See Comments)    Seizures  . Vimpat [Lacosamide] Other (See Comments)    Severe dizziness  . Betadine [Povidone Iodine] Rash  . Latex Rash  . Penicillins Itching and Rash    Has patient had a PCN reaction causing immediate rash, facial/tongue/throat swelling, SOB or lightheadedness with hypotension: No Has patient had a PCN reaction causing severe rash involving mucus membranes or skin necrosis: No Has patient had a PCN reaction that required hospitalization No Has patient had a PCN reaction occurring within the last 10 years: No If all of the above answers are "NO", then may proceed with Cephalosporin use.  . Sulfa Antibiotics Rash  . Tape Rash    Paper tape please  . Vicodin [Hydrocodone-Acetaminophen] Itching     Thank you for allowing pharmacy to be a part of this patient's care.   Adrian Saran, PharmD, BCPS Pager  [phone] 12/01/2015 5:45 PM

## 2015-12-01 NOTE — Progress Notes (Signed)
PROGRESS NOTE  [name] [id number] DOB: 05/03/1965 DOA: 11/28/2015 PCP: [name], MD  HPI/Recap of past 24 hours:  Febrile overnight  And repeat CXR ordered.   Assessment/Plan: Principal Problem:   Multifocal pneumonia Active Problems:   Moderate major depression (HCC)   Partial symptomatic epilepsy with complex partial seizures, not intractable, with status epilepticus ([place])   Insomnia w/ sleep apnea   HCAP (healthcare-associated pneumonia)  Sepsis presented on admission with fever, hypotension, tachycardia, tachypnea, hypoxia, leukocytosis,   mrsa screening negative, blood and sputum Culture pending,. Continues to have fever and chills.  Influenza pcr negative.  HIV negative.  Strep pneumo negative.  Legionella negative.    Acute hypoxic respiratory failure due to bilateral multifocal pna, worsening, changed antibiotics to broad spectrum antibiotics.  Weaned her off oxygen , get ambulating oxygen levels on RA.  Repeat Blood cultures ordered for fever.     Left sided rib pain likely from underline pna ; schedule iv toradol, rib pain much improved.   Active Problems:   Hypokalemia repleted as needed.     Hyperglycemia CBG (last 3)   Recent Labs  11/30/15 2031  GLUCAP 65   hgba1c is 5.4     Moderate major depression (HCC) On multiple psychotropics. Continue usual home medications including Celexa, Klonopin, Mirapex, Seroquel and trazodone. Controlled.    Partial symptomatic epilepsy with complex partial seizures, not intractable, with status epilepticus (HCC) Continue Keppra and Lamictal.     Insomnia w/ sleep apnea Supplemental oxygen ordered.   Code Status: full  Family Communication: patient   Disposition Plan: home tomorrow if afebrile.   Consultants:  none  Procedures:  none  Antibiotics:  levaquin till 11/18  Started on vancomycin and cefepime started on 11/18   Objective: BP 128/89 (BP Location: Right Arm)    Pulse 80   Temp 98.7 F (37.1 C) (Oral)   Resp 18   Ht 5' (1.524 m)   Wt 74.2 kg (163 lb 9.3 oz)   SpO2 95%   BMI 31.95 kg/m   Intake/Output Summary (Last 24 hours) at 12/01/15 1732 Last data filed at 12/01/15 0850  Gross per 24 hour  Intake              240 ml  Output              150 ml  Net               90 ml   Filed Weights   11/30/15 1500  Weight: 74.2 kg (163 lb 9.3 oz)    Exam:   General:  NAD  Cardiovascular: RRR  Respiratory: diminished at basis, some crackles  Abdomen: Soft/ND/NT, positive BS  Musculoskeletal: No Edema  Neuro: aaox3  Data Reviewed: Basic Metabolic Panel:  Recent Labs Lab 11/28/15 1202 11/30/15 0513  NA 136 139  K 3.3* 4.2  CL 105 109  CO2 23 25  GLUCOSE 184* 92  BUN 12 14  CREATININE 0.75 0.67  CALCIUM 8.5* 8.4*  MG  --  2.1   Liver Function Tests: No results for input(s): AST, ALT, ALKPHOS, BILITOT, PROT, ALBUMIN in the last 168 hours. No results for input(s): LIPASE, AMYLASE in the last 168 hours. No results for input(s): AMMONIA in the last 168 hours. CBC:  Recent Labs Lab 11/28/15 1202 11/30/15 0513 12/01/15 0523  WBC 16.9* 14.3* 10.3  NEUTROABS 14.5*  --   --   HGB 10.7* 10.9* 10.6*  HCT 31.8* 32.9* 31.5*  MCV  86.9 88.0 86.3  PLT 283 271 261   Cardiac Enzymes:   No results for input(s): CKTOTAL, CKMB, CKMBINDEX, TROPONINI in the last 168 hours. BNP (last 3 results) No results for input(s): BNP in the last 8760 hours.  ProBNP (last 3 results) No results for input(s): PROBNP in the last 8760 hours.  CBG:  Recent Labs Lab 11/30/15 2031  GLUCAP 65    Recent Results (from the past 240 hour(s))  Veritor Flu A/B Waived     Status: None   Collection Time: 11/26/15 11:43 AM  Result Value Ref Range Status   Influenza A Negative Negative Final   Influenza B Negative Negative Final    Comment: If the test is negative for the presence of influenza A or influenza B antigen, infection due to influenza  cannot be ruled-out because the antigen present in the sample may be below the detection limit of the test. It is recommended that these results be confirmed by viral culture or an FDA-cleared influenza A and B molecular assay.   Respiratory Panel by PCR     Status: None   Collection Time: 11/28/15  4:50 PM  Result Value Ref Range Status   Adenovirus NOT DETECTED NOT DETECTED Final   Coronavirus 229E NOT DETECTED NOT DETECTED Final   Coronavirus HKU1 NOT DETECTED NOT DETECTED Final   Coronavirus NL63 NOT DETECTED NOT DETECTED Final   Coronavirus OC43 NOT DETECTED NOT DETECTED Final   Metapneumovirus NOT DETECTED NOT DETECTED Final   Rhinovirus / Enterovirus NOT DETECTED NOT DETECTED Final   Influenza A NOT DETECTED NOT DETECTED Final   Influenza B NOT DETECTED NOT DETECTED Final   Parainfluenza Virus 1 NOT DETECTED NOT DETECTED Final   Parainfluenza Virus 2 NOT DETECTED NOT DETECTED Final   Parainfluenza Virus 3 NOT DETECTED NOT DETECTED Final   Parainfluenza Virus 4 NOT DETECTED NOT DETECTED Final   Respiratory Syncytial Virus NOT DETECTED NOT DETECTED Final   Bordetella pertussis NOT DETECTED NOT DETECTED Final   Chlamydophila pneumoniae NOT DETECTED NOT DETECTED Final   Mycoplasma pneumoniae NOT DETECTED NOT DETECTED Final    Comment: Performed at [facility]  MRSA PCR Screening     Status: None   Collection Time: 11/28/15  5:00 PM  Result Value Ref Range Status   MRSA by PCR NEGATIVE NEGATIVE Final    Comment:        The GeneXpert MRSA Assay (FDA approved for NASAL specimens only), is one component of a comprehensive MRSA colonization surveillance program. It is not intended to diagnose MRSA infection nor to guide or monitor treatment for MRSA infections.   Culture, blood (routine x 2) Call MD if unable to obtain prior to antibiotics being given     Status: None (Preliminary result)   Collection Time: 11/28/15  6:24 PM  Result Value Ref Range Status   Specimen  Description BLOOD RIGHT HAND  Final   Special Requests BOTTLES DRAWN AEROBIC AND ANAEROBIC 10CC  Final   Culture   Final    NO GROWTH 3 DAYS Performed at [facility]    Report Status PENDING  Incomplete  Culture, blood (routine x 2) Call MD if unable to obtain prior to antibiotics being given     Status: None (Preliminary result)   Collection Time: 11/28/15  6:24 PM  Result Value Ref Range Status   Specimen Description BLOOD LEFT ARM  Final   Special Requests BOTTLES DRAWN AEROBIC AND ANAEROBIC [place]  Final  Culture   Final    NO GROWTH 3 DAYS Performed at [facility]    Report Status PENDING  Incomplete  Culture, respiratory (NON-Expectorated)     Status: None (Preliminary result)   Collection Time: 11/30/15 11:00 AM  Result Value Ref Range Status   Specimen Description SPUTUM  Final   Special Requests NONE  Final   Gram Stain   Final    MODERATE WBC PRESENT,BOTH PMN AND MONONUCLEAR RARE YEAST FEW GRAM POSITIVE COCCI IN PAIRS RARE GRAM POSITIVE RODS RARE GRAM NEGATIVE COCCOBACILLI    Culture   Final    CULTURE REINCUBATED FOR BETTER GROWTH Performed at [facility]    Report Status PENDING  Incomplete  Culture, sputum-assessment     Status: None   Collection Time: 11/30/15 11:27 AM  Result Value Ref Range Status   Specimen Description SPUTUM  Final   Special Requests NONE  Final   Sputum evaluation THIS SPECIMEN IS ACCEPTABLE FOR SPUTUM CULTURE  Final   Report Status 11/30/2015 FINAL  Final     Studies: Dg Chest 2 View  Result Date: 12/01/2015 CLINICAL DATA:  Dry cough and night time fever for this past week. History of bronchitis. EXAM: CHEST  2 VIEW COMPARISON:  Chest radiograph - 11/28/2015; 11/12/2013; 05/08/2013; chest CT -05/08/2013 FINDINGS: Grossly unchanged cardiac silhouette and mediastinal contours. Worsening bilateral ill-defined heterogeneous potential airspace opacities, left greater than right. No pleural effusion or pneumothorax.  There is minimal pleural parenchymal thickening about the bilateral major fissures. No evidence of edema. No acute osseus abnormalities. IMPRESSION: Findings worrisome for progression of multi focal infection potentially secondary to atypical etiologies. A follow-up chest radiograph in 3 to 4 weeks after treatment is recommended to ensure resolution. Electronically Signed   By: Sandi Mariscal M.D.   On: 12/01/2015 14:30    Scheduled Meds: . citalopram  40 mg Oral Daily  . clonazePAM  0.5 mg Oral QHS  . enoxaparin (LOVENOX) injection  40 mg Subcutaneous Q24H  . gabapentin  600 mg Oral TID  . guaiFENesin  600 mg Oral BID  . ketorolac  7.5 mg Intravenous Q6H  . lamoTRIgine  100 mg Oral BID  . levETIRAcetam  1,500 mg Oral BID  . multivitamin with minerals  1 tablet Oral Daily  . pramipexole  0.5 mg Oral BID  . QUEtiapine  200 mg Oral Daily    Continuous Infusions: . 0.9 % NaCl with KCl 20 mEq / L 75 mL/hr at 11/30/15 1542     Time spent: 25 mins  Robyne Matar MD, [facility] Pager [phone]. If 7PM-7AM, please contact night-coverage at [email or web address], password [facility] 12/01/2015, 5:32 PM  LOS: 3 days

## 2015-12-02 DIAGNOSIS — J189 Pneumonia, unspecified organism: Secondary | ICD-10-CM | POA: Diagnosis not present

## 2015-12-02 DIAGNOSIS — G4730 Sleep apnea, unspecified: Secondary | ICD-10-CM

## 2015-12-02 DIAGNOSIS — G4700 Insomnia, unspecified: Secondary | ICD-10-CM | POA: Diagnosis not present

## 2015-12-02 DIAGNOSIS — F321 Major depressive disorder, single episode, moderate: Secondary | ICD-10-CM | POA: Diagnosis not present

## 2015-12-02 LAB — CULTURE, RESPIRATORY W GRAM STAIN

## 2015-12-02 LAB — CULTURE, RESPIRATORY: Culture: NORMAL

## 2015-12-02 MED ORDER — LIP MEDEX EX OINT
TOPICAL_OINTMENT | CUTANEOUS | Status: AC
  Administered 2015-12-02: 22:00:00
  Filled 2015-12-02: qty 7

## 2015-12-02 NOTE — Progress Notes (Signed)
PROGRESS NOTE  [name] [id number] DOB: Mar 26, 1965 DOA: 11/28/2015 PCP: [name], MD  HPI/Recap of past 24 hours:  Afebrile , no new complaints.   Assessment/Plan: Principal Problem:   Multifocal pneumonia Active Problems:   Moderate major depression (HCC)   Partial symptomatic epilepsy with complex partial seizures, not intractable, with status epilepticus ([place])   Insomnia w/ sleep apnea   HCAP (healthcare-associated pneumonia)  Sepsis presented on admission with fever, hypotension, tachycardia, tachypnea, hypoxia, leukocytosis,   mrsa screening negative, blood and sputum Culture pending,. Continues to have fever and chills. Changed antibiotics to vancomycin and cefepime and ordered SLP evaluation to evaluate for dysphagia and possible aspiration.  Influenza pcr negative.  HIV negative.  Strep pneumo negative.  Legionella negative.    Acute hypoxic respiratory failure due to bilateral multifocal pna, worsening, changed antibiotics to broad spectrum antibiotics.  Weaned her off oxygen , get ambulating oxygen levels on RA.  Repeat Blood cultures ordered for fever.     Left sided rib pain likely from underline pna ; schedule iv toradol, rib pain much improved.   Active Problems:   Hypokalemia repleted as needed.     Hyperglycemia CBG (last 3)   Recent Labs  11/30/15 2031  GLUCAP 65   hgba1c is 5.4     Moderate major depression (HCC) On multiple psychotropics. Continue usual home medications including Celexa, Klonopin, Mirapex, Seroquel and trazodone. Controlled.    Partial symptomatic epilepsy with complex partial seizures, not intractable, with status epilepticus (HCC) Continue Keppra and Lamictal.     Insomnia w/ sleep apnea Supplemental oxygen ordered.     Code Status: full  Family Communication: patient   Disposition Plan: home tomorrow if afebrile.   Consultants:  none  Procedures:  none  Antibiotics:  levaquin till  11/18  Started on vancomycin and cefepime started on 11/18   Objective: BP (!) 106/59 (BP Location: Left Arm)   Pulse 78   Temp 97.7 F (36.5 C) (Oral)   Resp 18   Ht 5' (1.524 m)   Wt 74.2 kg (163 lb 9.3 oz)   SpO2 97%   BMI 31.95 kg/m   Intake/Output Summary (Last 24 hours) at 12/02/15 1835 Last data filed at 12/02/15 0845  Gross per 24 hour  Intake             1605 ml  Output                0 ml  Net             1605 ml   Filed Weights   11/30/15 1500  Weight: 74.2 kg (163 lb 9.3 oz)    Exam:   General:  NAD  Cardiovascular: RRR  Respiratory: diminished at basis, some crackles  Abdomen: Soft/ND/NT, positive BS  Musculoskeletal: No Edema  Neuro: aaox3  Data Reviewed: Basic Metabolic Panel:  Recent Labs Lab 11/28/15 1202 11/30/15 0513  NA 136 139  K 3.3* 4.2  CL 105 109  CO2 23 25  GLUCOSE 184* 92  BUN 12 14  CREATININE 0.75 0.67  CALCIUM 8.5* 8.4*  MG  --  2.1   Liver Function Tests: No results for input(s): AST, ALT, ALKPHOS, BILITOT, PROT, ALBUMIN in the last 168 hours. No results for input(s): LIPASE, AMYLASE in the last 168 hours. No results for input(s): AMMONIA in the last 168 hours. CBC:  Recent Labs Lab 11/28/15 1202 11/30/15 0513 12/01/15 0523  WBC 16.9* 14.3* 10.3  NEUTROABS 14.5*  --   --  HGB 10.7* 10.9* 10.6*  HCT 31.8* 32.9* 31.5*  MCV 86.9 88.0 86.3  PLT 283 271 261   Cardiac Enzymes:   No results for input(s): CKTOTAL, CKMB, CKMBINDEX, TROPONINI in the last 168 hours. BNP (last 3 results) No results for input(s): BNP in the last 8760 hours.  ProBNP (last 3 results) No results for input(s): PROBNP in the last 8760 hours.  CBG:  Recent Labs Lab 11/30/15 2031  GLUCAP 65    Recent Results (from the past 240 hour(s))  Veritor Flu A/B Waived     Status: None   Collection Time: 11/26/15 11:43 AM  Result Value Ref Range Status   Influenza A Negative Negative Final   Influenza B Negative Negative Final     Comment: If the test is negative for the presence of influenza A or influenza B antigen, infection due to influenza cannot be ruled-out because the antigen present in the sample may be below the detection limit of the test. It is recommended that these results be confirmed by viral culture or an FDA-cleared influenza A and B molecular assay.   Respiratory Panel by PCR     Status: None   Collection Time: 11/28/15  4:50 PM  Result Value Ref Range Status   Adenovirus NOT DETECTED NOT DETECTED Final   Coronavirus 229E NOT DETECTED NOT DETECTED Final   Coronavirus HKU1 NOT DETECTED NOT DETECTED Final   Coronavirus NL63 NOT DETECTED NOT DETECTED Final   Coronavirus OC43 NOT DETECTED NOT DETECTED Final   Metapneumovirus NOT DETECTED NOT DETECTED Final   Rhinovirus / Enterovirus NOT DETECTED NOT DETECTED Final   Influenza A NOT DETECTED NOT DETECTED Final   Influenza B NOT DETECTED NOT DETECTED Final   Parainfluenza Virus 1 NOT DETECTED NOT DETECTED Final   Parainfluenza Virus 2 NOT DETECTED NOT DETECTED Final   Parainfluenza Virus 3 NOT DETECTED NOT DETECTED Final   Parainfluenza Virus 4 NOT DETECTED NOT DETECTED Final   Respiratory Syncytial Virus NOT DETECTED NOT DETECTED Final   Bordetella pertussis NOT DETECTED NOT DETECTED Final   Chlamydophila pneumoniae NOT DETECTED NOT DETECTED Final   Mycoplasma pneumoniae NOT DETECTED NOT DETECTED Final    Comment: Performed at [facility]  MRSA PCR Screening     Status: None   Collection Time: 11/28/15  5:00 PM  Result Value Ref Range Status   MRSA by PCR NEGATIVE NEGATIVE Final    Comment:        The GeneXpert MRSA Assay (FDA approved for NASAL specimens only), is one component of a comprehensive MRSA colonization surveillance program. It is not intended to diagnose MRSA infection nor to guide or monitor treatment for MRSA infections.   Culture, blood (routine x 2) Call MD if unable to obtain prior to antibiotics being given      Status: None (Preliminary result)   Collection Time: 11/28/15  6:24 PM  Result Value Ref Range Status   Specimen Description BLOOD RIGHT HAND  Final   Special Requests BOTTLES DRAWN AEROBIC AND ANAEROBIC 10CC  Final   Culture   Final    NO GROWTH 4 DAYS Performed at [facility]    Report Status PENDING  Incomplete  Culture, blood (routine x 2) Call MD if unable to obtain prior to antibiotics being given     Status: None (Preliminary result)   Collection Time: 11/28/15  6:24 PM  Result Value Ref Range Status   Specimen Description BLOOD LEFT ARM  Final   Special  Requests BOTTLES DRAWN AEROBIC AND ANAEROBIC [place]  Final   Culture   Final    NO GROWTH 4 DAYS Performed at [facility]    Report Status PENDING  Incomplete  Culture, respiratory (NON-Expectorated)     Status: None   Collection Time: 11/30/15 11:00 AM  Result Value Ref Range Status   Specimen Description SPUTUM  Final   Special Requests NONE  Final   Gram Stain   Final    MODERATE WBC PRESENT,BOTH PMN AND MONONUCLEAR RARE YEAST FEW GRAM POSITIVE COCCI IN PAIRS RARE GRAM POSITIVE RODS RARE GRAM NEGATIVE COCCOBACILLI    Culture   Final    Consistent with normal respiratory flora. Performed at [facility]    Report Status 12/02/2015 FINAL  Final  Culture, sputum-assessment     Status: None   Collection Time: 11/30/15 11:27 AM  Result Value Ref Range Status   Specimen Description SPUTUM  Final   Special Requests NONE  Final   Sputum evaluation THIS SPECIMEN IS ACCEPTABLE FOR SPUTUM CULTURE  Final   Report Status 11/30/2015 FINAL  Final  Culture, blood (Routine X 2) w Reflex to ID Panel     Status: None (Preliminary result)   Collection Time: 12/01/15  5:49 PM  Result Value Ref Range Status   Specimen Description BLOOD BLOOD LEFT FOREARM  Final   Special Requests IN PEDIATRIC BOTTLE 3CC  Final   Culture   Final    NO GROWTH < 24 HOURS Performed at [facility]    Report Status  PENDING  Incomplete  Culture, blood (Routine X 2) w Reflex to ID Panel     Status: None (Preliminary result)   Collection Time: 12/01/15  5:51 PM  Result Value Ref Range Status   Specimen Description BLOOD LEFT ANTECUBITAL  Final   Special Requests IN PEDIATRIC BOTTLE [place]  Final   Culture   Final    NO GROWTH < 24 HOURS Performed at [facility]    Report Status PENDING  Incomplete     Studies: No results found.  Scheduled Meds: . ceFEPime (MAXIPIME) IV  1 g Intravenous Q8H  . citalopram  40 mg Oral Daily  . clonazePAM  0.5 mg Oral QHS  . enoxaparin (LOVENOX) injection  40 mg Subcutaneous Q24H  . gabapentin  600 mg Oral TID  . guaiFENesin  600 mg Oral BID  . lamoTRIgine  100 mg Oral BID  . levETIRAcetam  1,500 mg Oral BID  . multivitamin with minerals  1 tablet Oral Daily  . pramipexole  0.5 mg Oral BID  . QUEtiapine  200 mg Oral Daily  . vancomycin  1,000 mg Intravenous Q12H    Continuous Infusions: . 0.9 % NaCl with KCl 20 mEq / L 75 mL/hr at 12/02/15 1243     Time spent: 25 mins  Patsie Mccardle MD, [facility] Pager [phone]. If 7PM-7AM, please contact night-coverage at [email or web address], password [facility] 12/02/2015, 6:35 PM  LOS: 4 days

## 2015-12-02 NOTE — Evaluation (Signed)
Clinical/Bedside Swallow Evaluation Patient Details  Name: [name] MRN: [id number] Date of Birth: 30-Apr-1965  Today's Date: 12/02/2015 Time: SLP Start Time (ACUTE ONLY): 1450 SLP Stop Time (ACUTE ONLY): 1504 SLP Time Calculation (min) (ACUTE ONLY): 14 min  Past Medical History:  Past Medical History:  Diagnosis Date  . Arthritis    "knees" (04/22/2012)  . Bronchitis   . Chronic bronchitis ([place])    "yearly; when the weather changes" (04/22/2012)  . Chronic kidney disease   . Colon polyps    adenomatous and hyperplastic-  . Constipation   . Depression   . Eczema   . Epilepsy ([place])    "been having them right often here lately" (04/22/2012)  . Fatty liver   . [id number])    "1/wk" (04/22/2012)  . High cholesterol   . History of kidney stones   . Osteoarthritis    [name] 04/22/2012  . Other convulsions 05/21/12   non-epileptic spells  . Rectal bleed    in toilet- bright red  . Restless leg   . Seizures ([place])   . Vertigo    Past Surgical History:  Past Surgical History:  Procedure Laterality Date  . ABDOMINAL HYSTERECTOMY  2001  . BLADDER SUSPENSION    . BUNIONECTOMY Left 2000  . CESAREAN SECTION  1987; 1988  . COLONOSCOPY    . JOINT REPLACEMENT    . MASS EXCISION  10/22/2011   Procedure: EXCISION MASS;  Surgeon: [name], MD;  Location: [place];  Service: General;  Laterality: Right;  excision right buttock mass  . TOTAL HIP ARTHROPLASTY Left 1993; 1995; 2000  . TOTAL KNEE ARTHROPLASTY Left 05/07/2015   Procedure: TOTAL LEFT KNEE ARTHROPLASTY;  Surgeon: [name], MD;  Location: [facility];  Service: Orthopedics;  Laterality: Left;  . TOTAL KNEE ARTHROPLASTY Right 10/01/2015   Procedure: RIGHT TOTAL KNEE ARTHROPLASTY;  Surgeon: [name], MD;  Location: [facility];  Service: Orthopedics;  Laterality: Right;   HPI:  [name] an 50 y.o.femalewith a PMH of bronchitis that flares up with weather changes, who presents to the ED with a one-week  history of worsening cough (nonproductive), pleuritic type chest pain that worsens with deep inspiration, and fever/chills. Patient has had a + sick contact: granddaughter. She saw her PCP 11/26/15 and was diagnosed with a viral infection and prescribed Tessalon Perles. She has had no improvement in her symptoms since that time. She also reports that she was recently diagnosed with OSA but is not currently on any CPAP. She had a total right knee arthroplasty back in September. CXR shows multifocal PNA (Multifocal patchy opacities in the bilateral upper lobes and left   Assessment / Plan / Recommendation Clinical Impression  Patient presents with a normal oropharyngeal swallow without evidence of dysphagia or aspiration. Reports mild difficulty masticating dry solids due to new dentures but wishes to remain on a regular diet. No SLP f/u indicated.     Aspiration Risk  No limitations    Diet Recommendation Regular;Thin liquid   Liquid Administration via: Cup;Straw Medication Administration: Whole meds with liquid Supervision: Patient able to self feed Compensations: Slow rate;Small sips/bites Postural Changes: Seated upright at 90 degrees    Other  Recommendations Oral Care Recommendations: Oral care BID   Follow up Recommendations None           Swallow Study   General HPI: [name] an 50 y.o.femalewith a PMH of bronchitis that flares up with weather changes, who presents to the  ED with a one-week history of worsening cough (nonproductive), pleuritic type chest pain that worsens with deep inspiration, and fever/chills. Patient has had a + sick contact: granddaughter. She saw her PCP 11/26/15 and was diagnosed with a viral infection and prescribed Tessalon Perles. She has had no improvement in her symptoms since that time. She also reports that she was recently diagnosed with OSA but is not currently on any CPAP. She had a total right knee arthroplasty back in September. CXR shows  multifocal PNA (Multifocal patchy opacities in the bilateral upper lobes and left Type of Study: Bedside Swallow Evaluation Previous Swallow Assessment: 2012 s/p admission for seizures, normal Diet Prior to this Study: Regular;Thin liquids Temperature Spikes Noted: Yes Respiratory Status: Room air History of Recent Intubation: No Behavior/Cognition: Alert;Cooperative;Pleasant mood Oral Cavity Assessment: Dry Oral Care Completed by SLP: No Oral Cavity - Dentition: Dentures, top;Dentures, bottom Vision: Functional for self-feeding Self-Feeding Abilities: Able to feed self Patient Positioning: Upright in bed Baseline Vocal Quality: Normal (lateral lisp) Volitional Cough: Strong Volitional Swallow: Able to elicit    Oral/Motor/Sensory Function Overall Oral Motor/Sensory Function: Within functional limits   Ice Chips Ice chips: Not tested   Thin Liquid Thin Liquid: Within functional limits Presentation: Cup;Self Fed;Straw    Nectar Thick Nectar Thick Liquid: Not tested   Honey Thick Honey Thick Liquid: Not tested   Puree Puree: Not tested   Solid   GO   Solid: Within functional limits Presentation: Self Fed       Oree Mirelez MA, CCC-SLP [phone]  Kiona Blume Meryl 12/02/2015,3:05 PM

## 2015-12-03 DIAGNOSIS — F321 Major depressive disorder, single episode, moderate: Secondary | ICD-10-CM

## 2015-12-03 DIAGNOSIS — G4730 Sleep apnea, unspecified: Secondary | ICD-10-CM | POA: Diagnosis not present

## 2015-12-03 DIAGNOSIS — G4700 Insomnia, unspecified: Secondary | ICD-10-CM | POA: Diagnosis not present

## 2015-12-03 DIAGNOSIS — J189 Pneumonia, unspecified organism: Secondary | ICD-10-CM | POA: Diagnosis not present

## 2015-12-03 LAB — CULTURE, BLOOD (ROUTINE X 2)
Culture: NO GROWTH
Culture: NO GROWTH

## 2015-12-03 LAB — TSH: TSH: 2.559 u[IU]/mL (ref 0.350–4.500)

## 2015-12-03 MED ORDER — CLINDAMYCIN HCL 150 MG PO CAPS: 450.0000 mg | ORAL_CAPSULE | Freq: Three times a day (TID) | ORAL | 0 refills | Status: DC

## 2015-12-03 NOTE — Progress Notes (Signed)
Date: December 03, 2015 Discharge orders checked for needs. No case management needs present at time of discharge. Velva Harman, RN, BSN, [place]   [phone]

## 2015-12-03 NOTE — Progress Notes (Signed)
Patient discharged home. Met brother at main entrance of hospital, escorted by NT. IV removed, discharge instructions reviewed, time for questions provided. Belongings sent with patient, room double checked.

## 2015-12-04 NOTE — Discharge Summary (Signed)
Physician Discharge Summary  LOVELLA [name] [id number] DOB: [birth year] DOA: 11/28/2015  PCP: [name], MD  Admit date: 11/28/2015 Discharge date: 12/03/2015  Admitted From: Home Disposition:  Home.   Recommendations for Outpatient Follow-up:  1. Follow up with PCP in 1-2 weeks 2. Please obtain BMP/CBC in one week 3. Please obtain a CXR to evaluate for resolution of the pneumonia in 3 to 4 weeks.     Discharge Condition:stable.  CODE STATUS:full code.  Diet recommendation: Heart Healthy   Brief/Interim Summary: LEELA [name] is an 50 y.o. female with a PMH of bronchitis that flares up with weather changes, who presents to the ED with a one-week history of worsening cough (nonproductive), pleuritic type chest pain that worsens with deep inspiration, and fever/chills. CXR shows multifocal pneumonia.   Discharge Diagnoses:  Principal Problem:   Multifocal pneumonia Active Problems:   Moderate major depression (HCC)   Partial symptomatic epilepsy with complex partial seizures, not intractable, with status epilepticus ([place])   Insomnia w/ sleep apnea   HCAP (healthcare-associated pneumonia)  Sepsis presented on admission with fever, hypotension, tachycardia, tachypnea, hypoxia, leukocytosis,   mrsa screening negative, blood and sputum Culture show normal flora.. As she continued  to have fever and chills. Changed antibiotics to vancomycin and cefepime and ordered SLP evaluation to evaluate for dysphagia and possible aspiration.  Influenza pcr negative.  HIV negative.  Strep pneumo negative.  Legionella negative.  SLP ruled out aspiration and her fevers resolved.  She was discharged on oral clindamycin to complete the course.    Acute hypoxic respiratory failure due to bilateral multifocal pna, worsening, changed antibiotics to broad spectrum antibiotics.  Weaned her off oxygen , good ambulating oxygen levels on RA.  Repeat Blood cultures ordered for fever and  negative. .     Left sided rib pain likely from underline pna ; resolved with pain meds.    Hypokalemia repleted as needed.   Hyperglycemia CBG (last 3)   Recent Labs (last 2 labs)    Recent Labs  11/30/15 2031  GLUCAP 65     hgba1c is 5.4   Moderate major depression (HCC) On multiple psychotropics. Continue usual home medications including Celexa, Klonopin, Mirapex, Seroquel and trazodone.Controlled.  Partial symptomatic epilepsy with complex partial seizures, not intractable, with status epilepticus (HCC) Continue Keppra and Lamictal.   Insomnia w/ sleep apnea Supplemental oxygen ordered.   Discharge Instructions  Discharge Instructions    Discharge instructions    Complete by:  As directed    Please follow up with PCP in one week.  Please get  CXR in 2 to 4 weeks to evaluate for resolution of the pneumonia.       Medication List    STOP taking these medications   HYDROcodone-homatropine 5-1.5 MG/5ML syrup Commonly known as:  HYCODAN   HYDROmorphone 2 MG tablet Commonly known as:  DILAUDID   predniSONE 10 MG (21) Tbpk tablet Commonly known as:  STERAPRED UNI-PAK 21 TAB     TAKE these medications   benzonatate 200 MG capsule Commonly known as:  TESSALON Take 1 capsule (200 mg total) by mouth 3 (three) times daily as needed. What changed:  reasons to take this   citalopram 40 MG tablet Commonly known as:  CELEXA TAKE ONE TABLET BY MOUTH ONCE DAILY   clindamycin 150 MG capsule Commonly known as:  CLEOCIN Take 3 capsules (450 mg total) by mouth 3 (three) times daily.   clonazePAM 0.5 MG tablet Commonly known as:  [place]  Take 1 tablet (0.5 mg total) by mouth at bedtime.   cyclobenzaprine 10 MG tablet Commonly known as:  FLEXERIL Take 1 tablet (10 mg total) by mouth 3 (three) times daily as needed. What changed:  reasons to take this   gabapentin 600 MG tablet Commonly known as:  NEURONTIN Take 1 tablet (600 mg  total) by mouth 3 (three) times daily.   hydrOXYzine 25 MG tablet Commonly known as:  ATARAX/VISTARIL Take 1 tablet (25 mg total) by mouth every 6 (six) hours as needed for itching.   lamoTRIgine 100 MG tablet Commonly known as:  LAMICTAL TAKE ONE TABLET BY MOUTH TWICE DAILY   levETIRAcetam 500 MG tablet Commonly known as:  KEPPRA Take 3 tablets PO in the morning and 3 tablets PO in the evening. What changed:  how much to take  how to take this  when to take this  additional instructions   multivitamin with minerals Tabs tablet Take 1 tablet by mouth daily.   pramipexole 0.5 MG tablet Commonly known as:  MIRAPEX TAKE ONE TABLET BY MOUTH TWICE DAILY   QUEtiapine 200 MG 24 hr tablet Commonly known as:  SEROQUEL XR Take 200 mg by mouth daily.   traZODone 50 MG tablet Commonly known as:  DESYREL Take 1-2 tablets (50-100 mg total) by mouth at bedtime as needed for sleep.   triamcinolone cream 0.5 % Commonly known as:  KENALOG Apply 1 application topically 3 (three) times daily.      Follow-up Information    STACKS,WARREN, MD. Schedule an appointment as soon as possible for a visit in 1 week(s).   Specialty:  Family Medicine Contact information: [address] [place] [address] [phone]          Allergies  Allergen Reactions  . Acetaminophen Other (See Comments)    Has fatty deposits on liver  . Benadryl [Diphenhydramine Hcl] Other (See Comments)    hyperactivity and seizures  . Codeine Itching and Rash    seizures  . Dilantin [Phenytoin Sodium Extended] Other (See Comments)    Elevated LFT's  . Melatonin Other (See Comments)    seizures  . Ultram [Tramadol Hcl] Other (See Comments)    Seizures  . Vimpat [Lacosamide] Other (See Comments)    Severe dizziness  . Betadine [Povidone Iodine] Rash  . Latex Rash  . Penicillins Itching and Rash    Has patient had a PCN reaction causing immediate rash, facial/tongue/throat swelling, SOB or  lightheadedness with hypotension: No Has patient had a PCN reaction causing severe rash involving mucus membranes or skin necrosis: No Has patient had a PCN reaction that required hospitalization No Has patient had a PCN reaction occurring within the last 10 years: No If all of the above answers are "NO", then may proceed with Cephalosporin use.  . Sulfa Antibiotics Rash  . Tape Rash    Paper tape please  . Vicodin [Hydrocodone-Acetaminophen] Itching    Consultations:  None.    Procedures/Studies: Dg Chest 2 View  Result Date: 12/01/2015 CLINICAL DATA:  Dry cough and night time fever for this past week. History of bronchitis. EXAM: CHEST  2 VIEW COMPARISON:  Chest radiograph - 11/28/2015; 11/12/2013; 05/08/2013; chest CT -05/08/2013 FINDINGS: Grossly unchanged cardiac silhouette and mediastinal contours. Worsening bilateral ill-defined heterogeneous potential airspace opacities, left greater than right. No pleural effusion or pneumothorax. There is minimal pleural parenchymal thickening about the bilateral major fissures. No evidence of edema. No acute osseus abnormalities. IMPRESSION: Findings worrisome for progression of  multi focal infection potentially secondary to atypical etiologies. A follow-up chest radiograph in 3 to 4 weeks after treatment is recommended to ensure resolution. Electronically Signed   By: Sandi Mariscal M.D.   On: 12/01/2015 14:30   Dg Chest 2 View  Result Date: 11/28/2015 CLINICAL DATA:  URI symptoms, fever EXAM: CHEST  2 VIEW COMPARISON:  11/12/2013 FINDINGS: Multifocal patchy opacities in the bilateral upper lobes and left lower lobe, suspicious for pneumonia. Heart is normal in size. Mild degenerative changes of the visualized thoracolumbar spine. IMPRESSION: Multifocal patchy opacities in the bilateral upper lobes and left lower lobe, suspicious for pneumonia. Follow-up chest radiographs are suggested in 4-6 weeks to document clearance. Electronically Signed   By:  Julian Hy M.D.   On: 11/28/2015 12:52       Subjective:  No new complaints.  Discharge Exam: Vitals:   12/02/15 2145 12/03/15 0407  BP: 114/83 130/87  Pulse: 78 67  Resp: 18 20  Temp: 98.7 F (37.1 C) 97.8 F (36.6 C)   Vitals:   12/02/15 0449 12/02/15 1339 12/02/15 2145 12/03/15 0407  BP: 104/76 (!) 106/59 114/83 130/87  Pulse: 79 78 78 67  Resp: 18 18 18 20   Temp: 98.1 F (36.7 C) 97.7 F (36.5 C) 98.7 F (37.1 C) 97.8 F (36.6 C)  TempSrc: Oral Oral Oral Oral  SpO2: 98% 97% 100% 97%  Weight:      Height:        General: Pt is alert, awake, not in acute distress Cardiovascular: RRR, S1/S2 +, no rubs, no gallops Respiratory: CTA bilaterally, no wheezing, no rhonchi Abdominal: Soft, NT, ND, bowel sounds + Extremities: no edema, no cyanosis    The results of significant diagnostics from this hospitalization (including imaging, microbiology, ancillary and laboratory) are listed below for reference.     Microbiology: Recent Results (from the past 240 hour(s))  Veritor Flu A/B Waived     Status: None   Collection Time: 11/26/15 11:43 AM  Result Value Ref Range Status   Influenza A Negative Negative Final   Influenza B Negative Negative Final    Comment: If the test is negative for the presence of influenza A or influenza B antigen, infection due to influenza cannot be ruled-out because the antigen present in the sample may be below the detection limit of the test. It is recommended that these results be confirmed by viral culture or an FDA-cleared influenza A and B molecular assay.   Respiratory Panel by PCR     Status: None   Collection Time: 11/28/15  4:50 PM  Result Value Ref Range Status   Adenovirus NOT DETECTED NOT DETECTED Final   Coronavirus 229E NOT DETECTED NOT DETECTED Final   Coronavirus HKU1 NOT DETECTED NOT DETECTED Final   Coronavirus NL63 NOT DETECTED NOT DETECTED Final   Coronavirus OC43 NOT DETECTED NOT DETECTED Final    Metapneumovirus NOT DETECTED NOT DETECTED Final   Rhinovirus / Enterovirus NOT DETECTED NOT DETECTED Final   Influenza A NOT DETECTED NOT DETECTED Final   Influenza B NOT DETECTED NOT DETECTED Final   Parainfluenza Virus 1 NOT DETECTED NOT DETECTED Final   Parainfluenza Virus 2 NOT DETECTED NOT DETECTED Final   Parainfluenza Virus 3 NOT DETECTED NOT DETECTED Final   Parainfluenza Virus 4 NOT DETECTED NOT DETECTED Final   Respiratory Syncytial Virus NOT DETECTED NOT DETECTED Final   Bordetella pertussis NOT DETECTED NOT DETECTED Final   Chlamydophila pneumoniae NOT DETECTED NOT DETECTED Final  Mycoplasma pneumoniae NOT DETECTED NOT DETECTED Final    Comment: Performed at [facility]  MRSA PCR Screening     Status: None   Collection Time: 11/28/15  5:00 PM  Result Value Ref Range Status   MRSA by PCR NEGATIVE NEGATIVE Final    Comment:        The GeneXpert MRSA Assay (FDA approved for NASAL specimens only), is one component of a comprehensive MRSA colonization surveillance program. It is not intended to diagnose MRSA infection nor to guide or monitor treatment for MRSA infections.   Culture, blood (routine x 2) Call MD if unable to obtain prior to antibiotics being given     Status: None   Collection Time: 11/28/15  6:24 PM  Result Value Ref Range Status   Specimen Description BLOOD RIGHT HAND  Final   Special Requests BOTTLES DRAWN AEROBIC AND ANAEROBIC 10CC  Final   Culture   Final    NO GROWTH 5 DAYS Performed at [facility]    Report Status 12/03/2015 FINAL  Final  Culture, blood (routine x 2) Call MD if unable to obtain prior to antibiotics being given     Status: None   Collection Time: 11/28/15  6:24 PM  Result Value Ref Range Status   Specimen Description BLOOD LEFT ARM  Final   Special Requests BOTTLES DRAWN AEROBIC AND ANAEROBIC [place]  Final   Culture   Final    NO GROWTH 5 DAYS Performed at [facility]    Report Status 12/03/2015 FINAL   Final  Culture, respiratory (NON-Expectorated)     Status: None   Collection Time: 11/30/15 11:00 AM  Result Value Ref Range Status   Specimen Description SPUTUM  Final   Special Requests NONE  Final   Gram Stain   Final    MODERATE WBC PRESENT,BOTH PMN AND MONONUCLEAR RARE YEAST FEW GRAM POSITIVE COCCI IN PAIRS RARE GRAM POSITIVE RODS RARE GRAM NEGATIVE COCCOBACILLI    Culture   Final    Consistent with normal respiratory flora. Performed at [facility]    Report Status 12/02/2015 FINAL  Final  Culture, sputum-assessment     Status: None   Collection Time: 11/30/15 11:27 AM  Result Value Ref Range Status   Specimen Description SPUTUM  Final   Special Requests NONE  Final   Sputum evaluation THIS SPECIMEN IS ACCEPTABLE FOR SPUTUM CULTURE  Final   Report Status 11/30/2015 FINAL  Final  Culture, blood (Routine X 2) w Reflex to ID Panel     Status: None (Preliminary result)   Collection Time: 12/01/15  5:49 PM  Result Value Ref Range Status   Specimen Description BLOOD BLOOD LEFT FOREARM  Final   Special Requests IN PEDIATRIC BOTTLE 3CC  Final   Culture   Final    NO GROWTH 3 DAYS Performed at [facility]    Report Status PENDING  Incomplete  Culture, blood (Routine X 2) w Reflex to ID Panel     Status: None (Preliminary result)   Collection Time: 12/01/15  5:51 PM  Result Value Ref Range Status   Specimen Description BLOOD LEFT ANTECUBITAL  Final   Special Requests IN PEDIATRIC BOTTLE [place]  Final   Culture   Final    NO GROWTH 3 DAYS Performed at [facility]    Report Status PENDING  Incomplete     Labs: BNP (last 3 results) No results for input(s): BNP in the last 8760 hours. Basic  Metabolic Panel:  Recent Labs Lab 11/28/15 1202 11/30/15 0513  NA 136 139  K 3.3* 4.2  CL 105 109  CO2 23 25  GLUCOSE 184* 92  BUN 12 14  CREATININE 0.75 0.67  CALCIUM 8.5* 8.4*  MG  --  2.1   Liver Function Tests: No results for input(s): AST, ALT,  ALKPHOS, BILITOT, PROT, ALBUMIN in the last 168 hours. No results for input(s): LIPASE, AMYLASE in the last 168 hours. No results for input(s): AMMONIA in the last 168 hours. CBC:  Recent Labs Lab 11/28/15 1202 11/30/15 0513 12/01/15 0523  WBC 16.9* 14.3* 10.3  NEUTROABS 14.5*  --   --   HGB 10.7* 10.9* 10.6*  HCT 31.8* 32.9* 31.5*  MCV 86.9 88.0 86.3  PLT 283 271 261   Cardiac Enzymes: No results for input(s): CKTOTAL, CKMB, CKMBINDEX, TROPONINI in the last 168 hours. BNP: Invalid input(s): POCBNP CBG:  Recent Labs Lab 11/30/15 2031  GLUCAP 65   D-Dimer No results for input(s): DDIMER in the last 72 hours. Hgb A1c No results for input(s): HGBA1C in the last 72 hours. Lipid Profile No results for input(s): CHOL, HDL, LDLCALC, TRIG, CHOLHDL, LDLDIRECT in the last 72 hours. Thyroid function studies  Recent Labs  12/03/15 0517  TSH 2.559   Anemia work up No results for input(s): VITAMINB12, FOLATE, FERRITIN, TIBC, IRON, RETICCTPCT in the last 72 hours. Urinalysis    Component Value Date/Time   COLORURINE YELLOW 12/01/2015 [place] 12/01/2015 1208   APPEARANCEUR Clear 04/09/2015 1101   LABSPEC 1.013 12/01/2015 1208   PHURINE 6.5 12/01/2015 1208   GLUCOSEU NEGATIVE 12/01/2015 1208   HGBUR NEGATIVE 12/01/2015 1208   BILIRUBINUR NEGATIVE 12/01/2015 1208   BILIRUBINUR Negative 04/09/2015 1101   KETONESUR NEGATIVE 12/01/2015 1208   PROTEINUR NEGATIVE 12/01/2015 1208   UROBILINOGEN negative 04/19/2014 1332   UROBILINOGEN 0.2 12/04/2013 1414   NITRITE NEGATIVE 12/01/2015 1208   LEUKOCYTESUR NEGATIVE 12/01/2015 1208   LEUKOCYTESUR Negative 04/09/2015 1101   Sepsis Labs Invalid input(s): PROCALCITONIN,  WBC,  LACTICIDVEN Microbiology Recent Results (from the past 240 hour(s))  Veritor Flu A/B Waived     Status: None   Collection Time: 11/26/15 11:43 AM  Result Value Ref Range Status   Influenza A Negative Negative Final   Influenza B Negative  Negative Final    Comment: If the test is negative for the presence of influenza A or influenza B antigen, infection due to influenza cannot be ruled-out because the antigen present in the sample may be below the detection limit of the test. It is recommended that these results be confirmed by viral culture or an FDA-cleared influenza A and B molecular assay.   Respiratory Panel by PCR     Status: None   Collection Time: 11/28/15  4:50 PM  Result Value Ref Range Status   Adenovirus NOT DETECTED NOT DETECTED Final   Coronavirus 229E NOT DETECTED NOT DETECTED Final   Coronavirus HKU1 NOT DETECTED NOT DETECTED Final   Coronavirus NL63 NOT DETECTED NOT DETECTED Final   Coronavirus OC43 NOT DETECTED NOT DETECTED Final   Metapneumovirus NOT DETECTED NOT DETECTED Final   Rhinovirus / Enterovirus NOT DETECTED NOT DETECTED Final   Influenza A NOT DETECTED NOT DETECTED Final   Influenza B NOT DETECTED NOT DETECTED Final   Parainfluenza Virus 1 NOT DETECTED NOT DETECTED Final   Parainfluenza Virus 2 NOT DETECTED NOT DETECTED Final   Parainfluenza Virus 3 NOT DETECTED NOT DETECTED Final  Parainfluenza Virus 4 NOT DETECTED NOT DETECTED Final   Respiratory Syncytial Virus NOT DETECTED NOT DETECTED Final   Bordetella pertussis NOT DETECTED NOT DETECTED Final   Chlamydophila pneumoniae NOT DETECTED NOT DETECTED Final   Mycoplasma pneumoniae NOT DETECTED NOT DETECTED Final    Comment: Performed at [facility]  MRSA PCR Screening     Status: None   Collection Time: 11/28/15  5:00 PM  Result Value Ref Range Status   MRSA by PCR NEGATIVE NEGATIVE Final    Comment:        The GeneXpert MRSA Assay (FDA approved for NASAL specimens only), is one component of a comprehensive MRSA colonization surveillance program. It is not intended to diagnose MRSA infection nor to guide or monitor treatment for MRSA infections.   Culture, blood (routine x 2) Call MD if unable to obtain prior to  antibiotics being given     Status: None   Collection Time: 11/28/15  6:24 PM  Result Value Ref Range Status   Specimen Description BLOOD RIGHT HAND  Final   Special Requests BOTTLES DRAWN AEROBIC AND ANAEROBIC 10CC  Final   Culture   Final    NO GROWTH 5 DAYS Performed at [facility]    Report Status 12/03/2015 FINAL  Final  Culture, blood (routine x 2) Call MD if unable to obtain prior to antibiotics being given     Status: None   Collection Time: 11/28/15  6:24 PM  Result Value Ref Range Status   Specimen Description BLOOD LEFT ARM  Final   Special Requests BOTTLES DRAWN AEROBIC AND ANAEROBIC [place]  Final   Culture   Final    NO GROWTH 5 DAYS Performed at [facility]    Report Status 12/03/2015 FINAL  Final  Culture, respiratory (NON-Expectorated)     Status: None   Collection Time: 11/30/15 11:00 AM  Result Value Ref Range Status   Specimen Description SPUTUM  Final   Special Requests NONE  Final   Gram Stain   Final    MODERATE WBC PRESENT,BOTH PMN AND MONONUCLEAR RARE YEAST FEW GRAM POSITIVE COCCI IN PAIRS RARE GRAM POSITIVE RODS RARE GRAM NEGATIVE COCCOBACILLI    Culture   Final    Consistent with normal respiratory flora. Performed at [facility]    Report Status 12/02/2015 FINAL  Final  Culture, sputum-assessment     Status: None   Collection Time: 11/30/15 11:27 AM  Result Value Ref Range Status   Specimen Description SPUTUM  Final   Special Requests NONE  Final   Sputum evaluation THIS SPECIMEN IS ACCEPTABLE FOR SPUTUM CULTURE  Final   Report Status 11/30/2015 FINAL  Final  Culture, blood (Routine X 2) w Reflex to ID Panel     Status: None (Preliminary result)   Collection Time: 12/01/15  5:49 PM  Result Value Ref Range Status   Specimen Description BLOOD BLOOD LEFT FOREARM  Final   Special Requests IN PEDIATRIC BOTTLE 3CC  Final   Culture   Final    NO GROWTH 3 DAYS Performed at [facility]    Report Status PENDING   Incomplete  Culture, blood (Routine X 2) w Reflex to ID Panel     Status: None (Preliminary result)   Collection Time: 12/01/15  5:51 PM  Result Value Ref Range Status   Specimen Description BLOOD LEFT ANTECUBITAL  Final   Special Requests IN PEDIATRIC BOTTLE [facility]  Final   Culture   Final  NO GROWTH 3 DAYS Performed at [facility]    Report Status PENDING  Incomplete     Time coordinating discharge: Over 30 minutes  SIGNED:   Hosie Poisson, MD  [facility] 12/04/2015, 10:08 AM Pager   If 7PM-7AM, please contact night-coverage [email or web address] Password [email or web address]

## 2015-12-05 ENCOUNTER — Telehealth: Payer: Self-pay | Admitting: Family Medicine

## 2015-12-05 NOTE — Telephone Encounter (Signed)
Requesting sample for inhaler for husband Makina Shoots.  #2 symbicort inhalers left at front desk

## 2015-12-06 LAB — CULTURE, BLOOD (ROUTINE X 2)
Culture: NO GROWTH
Culture: NO GROWTH

## 2015-12-11 ENCOUNTER — Ambulatory Visit (INDEPENDENT_AMBULATORY_CARE_PROVIDER_SITE_OTHER): Payer: PPO

## 2015-12-11 ENCOUNTER — Ambulatory Visit (INDEPENDENT_AMBULATORY_CARE_PROVIDER_SITE_OTHER): Payer: PPO | Admitting: Family Medicine

## 2015-12-11 ENCOUNTER — Encounter: Payer: Self-pay | Admitting: Family Medicine

## 2015-12-11 VITALS — BP 113/78 | HR 77 | Temp 97.9°F | Ht 60.0 in | Wt 162.0 lb

## 2015-12-11 DIAGNOSIS — J189 Pneumonia, unspecified organism: Secondary | ICD-10-CM

## 2015-12-11 MED ORDER — FLUCONAZOLE 100 MG PO TABS: 100.0000 mg | ORAL_TABLET | Freq: Every day | ORAL | 0 refills | Status: DC

## 2015-12-11 MED ORDER — DOXYCYCLINE HYCLATE 100 MG PO CAPS: 100.0000 mg | ORAL_CAPSULE | Freq: Two times a day (BID) | ORAL | 0 refills | Status: DC

## 2015-12-11 NOTE — Progress Notes (Signed)
Subjective:  Patient ID: [name], female    DOB: February 25, 1965  Age: 50 y.o. MRN: [id number]  CC: Hospital followup (pneumonia - reports she still has some cough, finished Clindamycin yesterday)   HPI [name] presents for Follow-up of pneumonia. She just finished her antibiotic. She been placed on clindamycin at discharge. She was admitted to inpatient at [facility] for pneumonia on 11/28/2015 and discharged on 1120. Discharge summary were reviewed showing that blood cultures were negative as was HIV strep pneumo and Legionella testing. She was advanced to vancomycin and cefepime due to continued fever. Once this was done her symptoms started to improve. She's been home for a week now. She says she still has an occasional cough. However when she was admitted she said she did shake the bed she coughed so hard. The cough is now occasional and nonproductive. She denies any shortness of breath currently. She has no fever chills or sweats. Energy is fair to good. Depression noted in the hospital they just continued her medicines stating that it was under good control. Most recent HQ scores reviewed as below. Depression screen [facility] 2/9 12/11/2015 11/26/2015 11/21/2015 10/22/2015 09/21/2015  Decreased Interest 0 '1 2 2 3  ' Down, Depressed, Hopeless - 1 1 0 3  PHQ - 2 Score 0 '2 3 2 6  ' Altered sleeping - '2 2 1 2  ' Tired, decreased energy - '1 1 3 2  ' Change in appetite - '3 3 2 1  ' Feeling bad or failure about yourself  - '3 3 1 3  ' Trouble concentrating - '2 2 3 2  ' Moving slowly or fidgety/restless - '2 2 2 1  ' Suicidal thoughts - 0 1 0 3  PHQ-9 Score - '15 17 14 20  ' Difficult doing work/chores - Somewhat difficult Somewhat difficult Very difficult Extremely dIfficult  Some recent data might be hidden      History [name] has a past medical history of Arthritis; Bronchitis; Chronic bronchitis ([place]); Chronic kidney disease; Colon polyps; Constipation; Depression; Eczema; Epilepsy ([place]); Fatty liver;  Headache(784.0); High cholesterol; History of kidney stones; Osteoarthritis; Other convulsions (05/21/12); Rectal bleed; Restless leg; Seizures ([place]); and Vertigo.   She has a past surgical history that includes Total hip arthroplasty (Left, 1993; 1995; 2000); Abdominal hysterectomy (2001); Colonoscopy; Joint replacement; Mass excision (10/22/2011); Bladder suspension; Cesarean section (2233; 1988); Bunionectomy (Left, 2000); Total knee arthroplasty (Left, 05/07/2015); and Total knee arthroplasty (Right, 10/01/2015).   Her family history includes Cancer in her brother, maternal aunt, and mother; Diabetes in her brother and maternal aunt.She reports that she has never smoked. She has never used smokeless tobacco. She reports that she does not drink alcohol or use drugs.    ROS Review of Systems  Constitutional: Negative for activity change, appetite change and fever.  HENT: Negative for congestion, rhinorrhea and sore throat.   Eyes: Negative for visual disturbance.  Respiratory: Positive for cough. Negative for shortness of breath.   Cardiovascular: Negative for chest pain and palpitations.  Gastrointestinal: Negative for abdominal pain, diarrhea and nausea.  Genitourinary: Positive for vaginal discharge (slight, with itch). Negative for dysuria.  Musculoskeletal: Negative for arthralgias and myalgias.    Objective:  BP 113/78   Pulse 77   Temp 97.9 F (36.6 C) (Oral)   Ht 5' (1.524 m)   Wt 162 lb (73.5 kg)   BMI 31.64 kg/m   BP Readings from Last 3 Encounters:  12/11/15 113/78  12/03/15 130/87  11/26/15 110/76    Wt Readings  from Last 3 Encounters:  12/11/15 162 lb (73.5 kg)  11/30/15 163 lb 9.3 oz (74.2 kg)  11/26/15 164 lb 3.2 oz (74.5 kg)     Physical Exam  Constitutional: She is oriented to person, place, and time. She appears well-developed and well-nourished. No distress.  HENT:  Head: Normocephalic and atraumatic.  Right Ear: External ear normal.  Left Ear: External  ear normal.  Nose: Nose normal.  Mouth/Throat: Oropharynx is clear and moist.  Eyes: Conjunctivae and EOM are normal. Pupils are equal, round, and reactive to light.  Neck: Normal range of motion. Neck supple. No thyromegaly present.  Cardiovascular: Normal rate, regular rhythm and normal heart sounds.   No murmur heard. Pulmonary/Chest: Effort normal and breath sounds normal. No respiratory distress. She has no wheezes. She has no rales.  Abdominal: Soft. Bowel sounds are normal. She exhibits no distension. There is no tenderness.  Lymphadenopathy:    She has no cervical adenopathy.  Neurological: She is alert and oriented to person, place, and time. She has normal reflexes.  Skin: Skin is warm and dry.  Psychiatric: She has a normal mood and affect. Her behavior is normal. Judgment and thought content normal.     Lab Results  Component Value Date   WBC 10.3 12/01/2015   HGB 10.6 (L) 12/01/2015   HCT 31.5 (L) 12/01/2015   PLT 261 12/01/2015   GLUCOSE 92 11/30/2015   CHOL 217 (H) 04/09/2015   TRIG 156 (H) 04/09/2015   HDL 50 04/09/2015   LDLCALC 136 (H) 04/09/2015   ALT 68 (H) 09/25/2015   AST 65 (H) 09/25/2015   NA 139 11/30/2015   K 4.2 11/30/2015   CL 109 11/30/2015   CREATININE 0.67 11/30/2015   BUN 14 11/30/2015   CO2 25 11/30/2015   TSH 2.559 12/03/2015   INR 1.01 09/25/2015   HGBA1C 5.4 11/28/2015    Dg Chest 2 View  Result Date: 11/28/2015 CLINICAL DATA:  URI symptoms, fever EXAM: CHEST  2 VIEW COMPARISON:  11/12/2013 FINDINGS: Multifocal patchy opacities in the bilateral upper lobes and left lower lobe, suspicious for pneumonia. Heart is normal in size. Mild degenerative changes of the visualized thoracolumbar spine. IMPRESSION: Multifocal patchy opacities in the bilateral upper lobes and left lower lobe, suspicious for pneumonia. Follow-up chest radiographs are suggested in 4-6 weeks to document clearance. Electronically Signed   By: [name] M.D.   On:  11/28/2015 12:52   CXR today, minimal remaining density LLL at base   Assessment & Plan:   [name] was seen today for hospital followup.  Diagnoses and all orders for this visit:  Multifocal pneumonia -     CBC with Differential/Platelet -     CMP14+EGFR -     DG Chest 2 View; Future -     fluconazole (DIFLUCAN) 100 MG tablet; Take 1 tablet (100 mg total) by mouth daily.  Other orders -     doxycycline (VIBRAMYCIN) 100 MG capsule; Take 1 capsule (100 mg total) by mouth 2 (two) times daily.   I have discontinued Ms. Creegan's clindamycin. I am also having her start on fluconazole and doxycycline. Additionally, I am having her maintain her lamoTRIgine, levETIRAcetam, clonazePAM, cyclobenzaprine, gabapentin, pramipexole, citalopram, triamcinolone cream, hydrOXYzine, traZODone, QUEtiapine, benzonatate, and multivitamin with minerals.  Meds ordered this encounter  Medications  . fluconazole (DIFLUCAN) 100 MG tablet    Sig: Take 1 tablet (100 mg total) by mouth daily.    Dispense:  15 tablet  Refill:  0  . doxycycline (VIBRAMYCIN) 100 MG capsule    Sig: Take 1 capsule (100 mg total) by mouth 2 (two) times daily.    Dispense:  20 capsule    Refill:  0     Follow-up: No Follow-up on file.  [name], M.D.

## 2015-12-12 LAB — CMP14+EGFR
ALT: 20 IU/L (ref 0–32)
AST: 15 IU/L (ref 0–40)
Albumin/Globulin Ratio: 1.8 (ref 1.2–2.2)
Albumin: 4.2 g/dL (ref 3.5–5.5)
Alkaline Phosphatase: 93 IU/L (ref 39–117)
BUN/Creatinine Ratio: 13 (ref 9–23)
BUN: 8 mg/dL (ref 6–24)
Bilirubin Total: 0.3 mg/dL (ref 0.0–1.2)
CO2: 26 mmol/L (ref 18–29)
Calcium: 9.2 mg/dL (ref 8.7–10.2)
Chloride: 100 mmol/L (ref 96–106)
Creatinine, Ser: 0.61 mg/dL (ref 0.57–1.00)
GFR calc Af Amer: 122 mL/min/{1.73_m2} (ref >59)
GFR calc non Af Amer: 106 mL/min/{1.73_m2} (ref >59)
Globulin, Total: 2.3 g/dL (ref 1.5–4.5)
Glucose: 79 mg/dL (ref 65–99)
Potassium: 4.5 mmol/L (ref 3.5–5.2)
Sodium: 143 mmol/L (ref 134–144)
Total Protein: 6.5 g/dL (ref 6.0–8.5)

## 2015-12-12 LAB — CBC WITH DIFFERENTIAL/PLATELET
Basophils Absolute: 0 10*3/uL (ref 0.0–0.2)
Basos: 1 %
EOS (ABSOLUTE): 0 10*3/uL (ref 0.0–0.4)
Eos: 0 %
Hematocrit: 36.9 % (ref 34.0–46.6)
Hemoglobin: 11.9 g/dL (ref 11.1–15.9)
Immature Grans (Abs): 0 10*3/uL (ref 0.0–0.1)
Immature Granulocytes: 0 %
Lymphocytes Absolute: 3 10*3/uL (ref 0.7–3.1)
Lymphs: 42 %
MCH: 28.1 pg (ref 26.6–33.0)
MCHC: 32.2 g/dL (ref 31.5–35.7)
MCV: 87 fL (ref 79–97)
Monocytes Absolute: 0.7 10*3/uL (ref 0.1–0.9)
Monocytes: 9 %
Neutrophils Absolute: 3.5 10*3/uL (ref 1.4–7.0)
Neutrophils: 48 %
Platelets: 480 10*3/uL — ABNORMAL HIGH (ref 150–379)
RBC: 4.24 x10E6/uL (ref 3.77–5.28)
RDW: 15.1 % (ref 12.3–15.4)
WBC: 7.2 10*3/uL (ref 3.4–10.8)

## 2015-12-19 ENCOUNTER — Encounter: Payer: Self-pay | Admitting: Family Medicine

## 2015-12-19 ENCOUNTER — Ambulatory Visit (INDEPENDENT_AMBULATORY_CARE_PROVIDER_SITE_OTHER): Payer: PPO

## 2015-12-19 ENCOUNTER — Ambulatory Visit (INDEPENDENT_AMBULATORY_CARE_PROVIDER_SITE_OTHER): Payer: PPO | Admitting: Family Medicine

## 2015-12-19 VITALS — BP 103/76 | HR 73 | Temp 97.2°F | Ht 60.0 in | Wt 164.6 lb

## 2015-12-19 DIAGNOSIS — J189 Pneumonia, unspecified organism: Secondary | ICD-10-CM

## 2015-12-19 MED ORDER — PREDNISONE 10 MG PO TABS: ORAL_TABLET | ORAL | 0 refills | Status: DC

## 2015-12-19 NOTE — Progress Notes (Signed)
Subjective:  Patient ID: [name], female    DOB: [birth year]  Age: 50 y.o. MRN: [id number]  CC: Pneumonia (recheck)   HPI [name] presents for recheck of pneumonia. Continues to cough a lot. Denies dyspnea.   History [name] has a past medical history of Arthritis; Bronchitis; Chronic bronchitis ([place]); Chronic kidney disease; Colon polyps; Constipation; Depression; Eczema; Epilepsy ([place]); Fatty liver; Headache(784.0); High cholesterol; History of kidney stones; Osteoarthritis; Other convulsions (05/21/12); Rectal bleed; Restless leg; Seizures ([place]); and Vertigo.   She has a past surgical history that includes Total hip arthroplasty (Left, 1993; 1995; 2000); Abdominal hysterectomy (2001); Colonoscopy; Joint replacement; Mass excision (10/22/2011); Bladder suspension; Cesarean section [id number]; 1988); Bunionectomy (Left, 2000); Total knee arthroplasty (Left, 05/07/2015); and Total knee arthroplasty (Right, 10/01/2015).   Her family history includes Cancer in her brother, maternal aunt, and mother; Diabetes in her brother and maternal aunt.She reports that she has never smoked. She has never used smokeless tobacco. She reports that she does not drink alcohol or use drugs.  Current Outpatient Prescriptions on File Prior to Visit  Medication Sig Dispense Refill  . benzonatate (TESSALON) 200 MG capsule Take 1 capsule (200 mg total) by mouth 3 (three) times daily as needed. 30 capsule 1  . citalopram (CELEXA) 40 MG tablet TAKE ONE TABLET BY MOUTH ONCE DAILY 30 tablet 2  . clonazePAM (KLONOPIN) 0.5 MG tablet Take 1 tablet (0.5 mg total) by mouth at bedtime. 30 tablet 5  . cyclobenzaprine (FLEXERIL) 10 MG tablet Take 1 tablet (10 mg total) by mouth 3 (three) times daily as needed. (Patient taking differently: Take 10 mg by mouth 3 (three) times daily as needed for muscle spasms. ) 90 tablet 0  . doxycycline (VIBRAMYCIN) 100 MG capsule Take 1 capsule (100 mg total) by mouth 2 (two) times daily. 20  capsule 0  . fluconazole (DIFLUCAN) 100 MG tablet Take 1 tablet (100 mg total) by mouth daily. 15 tablet 0  . gabapentin (NEURONTIN) 600 MG tablet Take 1 tablet (600 mg total) by mouth 3 (three) times daily. 90 tablet 2  . hydrOXYzine (ATARAX/VISTARIL) 25 MG tablet Take 1 tablet (25 mg total) by mouth every 6 (six) hours as needed for itching. 50 tablet 0  . lamoTRIgine (LAMICTAL) 100 MG tablet TAKE ONE TABLET BY MOUTH TWICE DAILY 56 tablet 6  . levETIRAcetam (KEPPRA) 500 MG tablet Take 3 tablets PO in the morning and 3 tablets PO in the evening. (Patient taking differently: Take 1,500 mg by mouth 2 (two) times daily. Take 3 tablets PO in the morning and 3 tablets PO in the evening.) 180 tablet 5  . Multiple Vitamin (MULTIVITAMIN WITH MINERALS) TABS tablet Take 1 tablet by mouth daily.    . pramipexole (MIRAPEX) 0.5 MG tablet TAKE ONE TABLET BY MOUTH TWICE DAILY 60 tablet 5  . QUEtiapine (SEROQUEL XR) 200 MG 24 hr tablet Take 200 mg by mouth daily.    . traZODone (DESYREL) 50 MG tablet Take 1-2 tablets (50-100 mg total) by mouth at bedtime as needed for sleep. 60 tablet 5  . triamcinolone cream (KENALOG) 0.5 % Apply 1 application topically 3 (three) times daily. 30 g 0   No current facility-administered medications on file prior to visit.     ROS Review of Systems  Constitutional: Negative for activity change, appetite change and fever.  HENT: Negative for congestion, rhinorrhea and sore throat.   Eyes: Negative for visual disturbance.  Respiratory: Negative for cough and shortness of breath.  Cardiovascular: Negative for chest pain and palpitations.  Gastrointestinal: Negative for abdominal pain, diarrhea and nausea.  Genitourinary: Negative for dysuria.  Musculoskeletal: Negative for arthralgias and myalgias.    Objective:  BP 103/76   Pulse 73   Temp 97.2 F (36.2 C) (Oral)   Ht 5' (1.524 m)   Wt 164 lb 9.6 oz (74.7 kg)   BMI 32.15 kg/m   Physical Exam  Constitutional: She  is oriented to person, place, and time. She appears well-developed and well-nourished. No distress.  HENT:  Head: Normocephalic and atraumatic.  Eyes: Conjunctivae are normal. Pupils are equal, round, and reactive to light.  Neck: Normal range of motion. Neck supple. No thyromegaly present.  Cardiovascular: Normal rate, regular rhythm and normal heart sounds.   No murmur heard. Pulmonary/Chest: Effort normal and breath sounds normal. No respiratory distress. She has no wheezes. She has no rales.  Abdominal: Soft. Bowel sounds are normal. She exhibits no distension. There is no tenderness.  Musculoskeletal: Normal range of motion.  Lymphadenopathy:    She has no cervical adenopathy.  Neurological: She is alert and oriented to person, place, and time.  Skin: Skin is warm and dry.  Psychiatric: She has a normal mood and affect. Her behavior is normal. Judgment and thought content normal.    Assessment & Plan:   [name] was seen today for pneumonia.  Diagnoses and all orders for this visit:  Multifocal pneumonia -     DG Chest 2 View; Future  Other orders -     predniSONE (DELTASONE) 10 MG tablet; Take 5 daily for 2 days followed by 4,3,2 and 1 for 2 days each.   I am having [name] start on predniSONE. I am also having her maintain her lamoTRIgine, levETIRAcetam, clonazePAM, cyclobenzaprine, gabapentin, pramipexole, citalopram, triamcinolone cream, hydrOXYzine, traZODone, QUEtiapine, benzonatate, multivitamin with minerals, fluconazole, and doxycycline.  Meds ordered this encounter  Medications  . predniSONE (DELTASONE) 10 MG tablet    Sig: Take 5 daily for 2 days followed by 4,3,2 and 1 for 2 days each.    Dispense:  30 tablet    Refill:  0     Follow-up: Return in about 3 months (around 03/18/2016).  [name], M.D.

## 2015-12-25 ENCOUNTER — Ambulatory Visit (INDEPENDENT_AMBULATORY_CARE_PROVIDER_SITE_OTHER): Payer: PPO | Admitting: Psychiatry

## 2015-12-25 ENCOUNTER — Ambulatory Visit (HOSPITAL_COMMUNITY): Payer: Self-pay | Admitting: Psychiatry

## 2015-12-25 ENCOUNTER — Encounter (HOSPITAL_COMMUNITY): Payer: Self-pay | Admitting: Psychiatry

## 2015-12-25 VITALS — BP 99/74 | HR 80 | Ht 60.0 in | Wt 161.2 lb

## 2015-12-25 DIAGNOSIS — Z808 Family history of malignant neoplasm of other organs or systems: Secondary | ICD-10-CM | POA: Diagnosis not present

## 2015-12-25 DIAGNOSIS — Z9889 Other specified postprocedural states: Secondary | ICD-10-CM

## 2015-12-25 DIAGNOSIS — F331 Major depressive disorder, recurrent, moderate: Secondary | ICD-10-CM | POA: Diagnosis not present

## 2015-12-25 DIAGNOSIS — Z882 Allergy status to sulfonamides status: Secondary | ICD-10-CM

## 2015-12-25 DIAGNOSIS — Z634 Disappearance and death of family member: Secondary | ICD-10-CM | POA: Diagnosis not present

## 2015-12-25 DIAGNOSIS — Z833 Family history of diabetes mellitus: Secondary | ICD-10-CM

## 2015-12-25 DIAGNOSIS — Z888 Allergy status to other drugs, medicaments and biological substances status: Secondary | ICD-10-CM

## 2015-12-25 DIAGNOSIS — Z91040 Latex allergy status: Secondary | ICD-10-CM

## 2015-12-25 DIAGNOSIS — Z880 Allergy status to penicillin: Secondary | ICD-10-CM

## 2015-12-25 DIAGNOSIS — Z79899 Other long term (current) drug therapy: Secondary | ICD-10-CM

## 2015-12-25 MED ORDER — CLONAZEPAM 0.5 MG PO TABS: 0.5000 mg | ORAL_TABLET | Freq: Two times a day (BID) | ORAL | 1 refills | Status: DC | PRN

## 2015-12-25 MED ORDER — FLUOXETINE HCL 20 MG PO TABS: ORAL_TABLET | ORAL | 1 refills | Status: DC

## 2015-12-25 NOTE — Patient Instructions (Addendum)
1. Decrease citalopram 20 mg daily for one week, then discontinue 2. Start fluoxetine 20 mg daily for one week, then 40 mg daily 3. Continue clonazepam 0.5 mg twice a day as needed for anxiety 4. Continue Trazodone 50 mg at night for sleep 5. Return to clinic in January 6. Please contact for therapy: Dr. Alford Highland Schneidmiller  [phone] [address] [place], [place] [address]

## 2015-12-25 NOTE — Progress Notes (Signed)
Psychiatric Initial Adult Assessment   Patient Identification: [name] MRN:  [id number] Date of Evaluation:  12/25/2015 Referral Source: [facility] Chief Complaint:  "My husband is dying" Visit Diagnosis:    ICD-9-CM ICD-10-CM   1. Moderate episode of recurrent major depressive disorder (HCC) 296.32 F33.1   2. Bereavement V62.82 Z63.4     History of Present Illness:   [name] is a 50 year old female with depression, GAD, pseudoseizure, partial symptomatic epilepsy with complex partial seizures, osteoarthritis, congenital hip dislocation s/p replacement who presents for depression.   Per chart review - Patient had a seizure and was followed by neurologist; keppra was increased. - Patient was evaluated with sleep study in 08/2015; not consistent with sleep apnea, periodic limb movement. Patient is advised to see pulmonologist as needed for low oxygen at night   Patient states that she has been depressed, as her husband is "dying." She talks about her husband of 78 years, who was diagnosed leukemia in 2011. He cannot walk since last year and had severe complication after chemotherapy. He paid for his funeral and it was difficult for her to accept. She also talks about financial strain and her son with bipolar disorder who has been in prison for 8 years for stealing guns and using drugs. Although she was started on Celexa, she has limited benefit from this medication.   She endorses insomnia with night time awakening after sleeping two hours. She denies taking a nap. She reports anhedonia, and stopped doing journal as she feels tired.  She still enjoys watching TV and visiting her son and sister in law. She reports anxiety, social phobia and has panic attacks 4 times per week. She also had a "pseudoseizure" of "anxiety shaking" last night. She denies SI. She states that she had a seizure last about a month ago.   Associated Signs/Symptoms: Depression  Symptoms:  depressed mood, anhedonia, insomnia, fatigue, difficulty concentrating, (Hypo) Manic Symptoms:  denies Anxiety Symptoms:  Excessive Worry, Panic Symptoms, Psychotic Symptoms:  denies PTSD Symptoms: Negative  Past Psychiatric History:  Outpatient:  [facility],  Psychiatry admission:  in 2004 for suicide attempt (per note, she was also admitted in 09/04/2010-09/06/2010)  Previous suicide attempt: overdosing flexeril in 2004,  Past trials of medication: fluoxetine, citalopram, Trazodone, lorazepam, clonazepam History of violence: denies  Previous Psychotropic Medications: Yes  Substance Abuse History in the last 12 months:  No.  Consequences of Substance Abuse: NA  Past Medical History:  Past Medical History:  Diagnosis Date  . Arthritis    "knees" (04/22/2012)  . Bronchitis   . Chronic bronchitis ([place])    "yearly; when the weather changes" (04/22/2012)  . Chronic kidney disease   . Colon polyps    adenomatous and hyperplastic-  . Constipation   . Depression   . Eczema   . Epilepsy ([place])    "been having them right often here lately" (04/22/2012)  . Fatty liver   . [id number]    "1/wk" (04/22/2012)  . High cholesterol   . History of kidney stones   . Osteoarthritis    [name] 04/22/2012  . Other convulsions 05/21/12   non-epileptic spells  . Rectal bleed    in toilet- bright red  . Restless leg   . Seizures ([place])   . Vertigo     Past Surgical History:  Procedure Laterality Date  . ABDOMINAL HYSTERECTOMY  2001  . BLADDER SUSPENSION    . BUNIONECTOMY Left 2000  . CESAREAN SECTION  1987; 1988  . COLONOSCOPY    . JOINT REPLACEMENT    . MASS EXCISION  10/22/2011   Procedure: EXCISION MASS;  Surgeon: [name], MD;  Location: [facility];  Service: General;  Laterality: Right;  excision right buttock mass  . TOTAL HIP ARTHROPLASTY Left 1993; 1995; 2000  . TOTAL KNEE ARTHROPLASTY Left 05/07/2015   Procedure: TOTAL LEFT KNEE ARTHROPLASTY;  Surgeon: [name], MD;  Location: [facility];  Service: Orthopedics;  Laterality: Left;  . TOTAL KNEE ARTHROPLASTY Right 10/01/2015   Procedure: RIGHT TOTAL KNEE ARTHROPLASTY;  Surgeon: [name], MD;  Location: [facility];  Service: Orthopedics;  Laterality: Right;    Family Psychiatric History:  Father- depression, son age 13 - bipolar, schizophrenia,   Family History:  Family History  Problem Relation Age of Onset  . Cancer Mother   . Cancer Brother   . Diabetes Brother   . Cancer Maternal Aunt   . Diabetes Maternal Aunt     Social History:   Social History   Social History  . Marital status: Married    Spouse name: [name]  . Number of children: 2  . Years of education: 12 +   Occupational History  . unemployed Other   Social History Main Topics  . Smoking status: Never Smoker  . Smokeless tobacco: Never Used  . Alcohol use No     Comment: 12-25-2015 per pt no  . Drug use: No     Comment: 21-12-2015 per pt no   . Sexual activity: Not Currently    Birth control/ protection: Surgical   Other Topics Concern  . None   Social History Narrative   Patient is married [name]) and lives at home with her husband.   Patient has two adult children.   Patient is disabled.   Patient has a high school education and some trade school.   Patient is right-handed.   Patient drinks four glasses of soda and tea daily.    Additional Social History:  Lives with her husband of 31 years, and son's family, have two children Work: review record until 2004, currently on disability Education: high school  Allergies:   Allergies  Allergen Reactions  . Acetaminophen Other (See Comments)    Has fatty deposits on liver  . Benadryl [Diphenhydramine Hcl] Other (See Comments)    hyperactivity and seizures  . Codeine Itching and Rash    seizures  . Dilantin [Phenytoin Sodium Extended] Other (See Comments)    Elevated LFT's  . Melatonin Other (See Comments)    seizures  . Ultram [Tramadol Hcl]  Other (See Comments)    Seizures  . Vimpat [Lacosamide] Other (See Comments)    Severe dizziness  . Betadine [Povidone Iodine] Rash  . Latex Rash  . Penicillins Itching and Rash    Has patient had a PCN reaction causing immediate rash, facial/tongue/throat swelling, SOB or lightheadedness with hypotension: No Has patient had a PCN reaction causing severe rash involving mucus membranes or skin necrosis: No Has patient had a PCN reaction that required hospitalization No Has patient had a PCN reaction occurring within the last 10 years: No If all of the above answers are "NO", then may proceed with Cephalosporin use.  . Sulfa Antibiotics Rash  . Tape Rash    Paper tape please  . Vicodin [Hydrocodone-Acetaminophen] Itching    Metabolic Disorder Labs: Lab Results  Component Value Date   HGBA1C 5.4 11/28/2015   MPG 108 11/28/2015   No results  found for: PROLACTIN Lab Results  Component Value Date   CHOL 217 (H) 04/09/2015   TRIG 156 (H) 04/09/2015   HDL 50 04/09/2015   CHOLHDL 4.3 04/09/2015   LDLCALC 136 (H) 04/09/2015     Current Medications: Current Outpatient Prescriptions  Medication Sig Dispense Refill  . benzonatate (TESSALON) 200 MG capsule Take 1 capsule (200 mg total) by mouth 3 (three) times daily as needed. 30 capsule 1  . clonazePAM (KLONOPIN) 0.5 MG tablet Take 1 tablet (0.5 mg total) by mouth 2 (two) times daily as needed for anxiety. 60 tablet 1  . doxycycline (VIBRAMYCIN) 100 MG capsule Take 1 capsule (100 mg total) by mouth 2 (two) times daily. 20 capsule 0  . gabapentin (NEURONTIN) 600 MG tablet Take 1 tablet (600 mg total) by mouth 3 (three) times daily. 90 tablet 2  . lamoTRIgine (LAMICTAL) 100 MG tablet TAKE ONE TABLET BY MOUTH TWICE DAILY 56 tablet 6  . levETIRAcetam (KEPPRA) 500 MG tablet Take 3 tablets PO in the morning and 3 tablets PO in the evening. (Patient taking differently: Take 1,500 mg by mouth 2 (two) times daily. Take 3 tablets PO in the  morning and 3 tablets PO in the evening.) 180 tablet 5  . Multiple Vitamin (MULTIVITAMIN WITH MINERALS) TABS tablet Take 1 tablet by mouth daily.    . pramipexole (MIRAPEX) 0.5 MG tablet TAKE ONE TABLET BY MOUTH TWICE DAILY 60 tablet 5  . predniSONE (DELTASONE) 10 MG tablet Take 5 daily for 2 days followed by 4,3,2 and 1 for 2 days each. 30 tablet 0  . traZODone (DESYREL) 50 MG tablet Take 1-2 tablets (50-100 mg total) by mouth at bedtime as needed for sleep. 60 tablet 5  . triamcinolone cream (KENALOG) 0.5 % Apply 1 application topically 3 (three) times daily. 30 g 0  . FLUoxetine (PROZAC) 20 MG tablet Start 20 mg daily for one week, then 40 mg daily 60 tablet 1  . hydrOXYzine (ATARAX/VISTARIL) 25 MG tablet Take 1 tablet (25 mg total) by mouth every 6 (six) hours as needed for itching. (Patient not taking: Reported on 12/25/2015) 50 tablet 0   No current facility-administered medications for this visit.     Neurologic: Headache: No Seizure: Yes Paresthesias:No  Musculoskeletal: Strength & Muscle Tone: within normal limits Gait & Station: normal Patient leans: N/A  Psychiatric Specialty Exam: Review of Systems  Musculoskeletal: Positive for joint pain.  Neurological: Positive for seizures.  Psychiatric/Behavioral: Positive for depression. Negative for hallucinations, substance abuse and suicidal ideas. The patient is nervous/anxious and has insomnia.   All other systems reviewed and are negative.   Blood pressure 99/74, pulse 80, height 5' (1.524 m), weight 161 lb 3.2 oz (73.1 kg).Body mass index is 31.48 kg/m.  General Appearance: Casual  Eye Contact:  Good  Speech:  Garbled  Volume:  Normal  Mood:  Depressed  Affect:  reactive, full range although down talking about her husband  Thought Process:  Coherent and Goal Directed  Orientation:  Full (Time, Place, and Person)  Thought Content:  Logical Perceptions: denies AH/VH  Suicidal Thoughts:  No  Homicidal Thoughts:  No   Memory:  Immediate;   Good Recent;   Good Remote;   Good  Judgement:  Good  Insight:  Good  Psychomotor Activity:  Normal  Concentration:  Concentration: Good and Attention Span: Good  Recall:  Good  Fund of Knowledge:Good  Language: Good  Akathisia:  No (has tongue movement which patient reports she has  it since childhood)  Handed:  Right  AIMS (if indicated):  N/A  Assets:  Communication Skills Desire for Improvement  ADL's:  Intact  Cognition: WNL  Sleep:  poor   Assessment [name] is a 50 year old female with depression, GAD, pseudoseizure, partial symptomatic epilepsy with complex partial seizures, osteoarthritis, congenital hip dislocation s/p replacement who presents for depression.   # MDD # Bereavement Patient endorses worsening neurovegetative symptoms despite on citalopram. Psychosocial stressors including her husband with leukemia, her son in prison and financial strain. Will cross taper from citalopram to fluoxetine to target her mood. Will increase clonazepam for anxiety; discussed side effects which includes drowsiness, respiratory suppression. She will greatly benefit from supportive therapy for bereavement and/or CBT for depression. Will make a referral to a therapist.   # Insomnia Patient endorses insomnia. Discussed sleep hygiene. Will continue Trazodone. Per chart review, she was evaluated with sleep study and was recommended for pulmonologist with potential benefit from low oxygen. Will discuss at the next visit.   Plan 1. Decrease citalopram 20 mg daily for one week, then discontinue 2. Start fluoxetine 20 mg daily for one week, then 40 mg daily 3. Continue clonazepam 0.5 mg twice a day as needed for anxiety, order 60 tabs with one refill 4. Continue Trazodone 50 mg at night for sleep 5. Return to clinic in January 6. Please contact for therapy: Dr. Alford Highland Schneidmiller  [phone] [address], [place], [place] [address] (Patient is on  Gabapentin, Lamictal, Keppra for seizure, Pramipexole for restless leg)  The patient demonstrates the following risk factors for suicide: Chronic risk factors for suicide include: psychiatric disorder of depression and previous suicide attempts of overdosing medication. Acute risk factors for suicide include: unemployment and her husband with terminal illness. Protective factors for this patient include: positive social support, coping skills and hope for the future. Considering these factors, the overall suicide risk at this point appears to be low. Patient is appropriate for outpatient follow up.  Treatment Plan Summary: Plan as above   Norman Clay, MD 12/12/201711:46 AM

## 2016-01-09 ENCOUNTER — Telehealth: Payer: Self-pay | Admitting: Physician Assistant

## 2016-01-09 DIAGNOSIS — L309 Dermatitis, unspecified: Secondary | ICD-10-CM

## 2016-01-13 ENCOUNTER — Other Ambulatory Visit: Payer: Self-pay | Admitting: Family Medicine

## 2016-01-21 NOTE — Telephone Encounter (Signed)
You can make the referral.  I saw her 2 months ago and she had been dealing with it for 6 months or more

## 2016-01-24 ENCOUNTER — Telehealth: Payer: Self-pay | Admitting: Family Medicine

## 2016-01-24 NOTE — Telephone Encounter (Signed)
Referral placed.

## 2016-01-28 DIAGNOSIS — F331 Major depressive disorder, recurrent, moderate: Secondary | ICD-10-CM | POA: Diagnosis not present

## 2016-01-28 DIAGNOSIS — F4000 Agoraphobia, unspecified: Secondary | ICD-10-CM | POA: Diagnosis not present

## 2016-01-29 NOTE — Progress Notes (Deleted)
[facility] MD/PA/NP OP Progress Note  01/29/2016 12:13 PM [name]  MRN:  [id number]  Chief Complaint:  Subjective:  *** HPI: *** Visit Diagnosis: No diagnosis found.  Past Psychiatric History:  Outpatient:  [facility],  Psychiatry admission:  in 2004 for suicide attempt (per note, she was also admitted in 09/04/2010-09/06/2010)  Previous suicide attempt: overdosing flexeril in 2004,  Past trials of medication: fluoxetine, citalopram, Trazodone, lorazepam, clonazepam History of violence: denies  Past Medical History:  Past Medical History:  Diagnosis Date  . Arthritis    "knees" (04/22/2012)  . Bronchitis   . Chronic bronchitis ([place])    "yearly; when the weather changes" (04/22/2012)  . Chronic kidney disease   . Colon polyps    adenomatous and hyperplastic-  . Constipation   . Depression   . Eczema   . Epilepsy ([place])    "been having them right often here lately" (04/22/2012)  . Fatty liver   . [id number])    "1/wk" (04/22/2012)  . High cholesterol   . History of kidney stones   . Osteoarthritis    [name] 04/22/2012  . Other convulsions 05/21/12   non-epileptic spells  . Rectal bleed    in toilet- bright red  . Restless leg   . Seizures ([place])   . Vertigo     Past Surgical History:  Procedure Laterality Date  . ABDOMINAL HYSTERECTOMY  2001  . BLADDER SUSPENSION    . BUNIONECTOMY Left 2000  . CESAREAN SECTION  1987; 1988  . COLONOSCOPY    . JOINT REPLACEMENT    . MASS EXCISION  10/22/2011   Procedure: EXCISION MASS;  Surgeon: [name], MD;  Location: [facility];  Service: General;  Laterality: Right;  excision right buttock mass  . TOTAL HIP ARTHROPLASTY Left 1993; 1995; 2000  . TOTAL KNEE ARTHROPLASTY Left 05/07/2015   Procedure: TOTAL LEFT KNEE ARTHROPLASTY;  Surgeon: [name], MD;  Location: [facility];  Service: Orthopedics;  Laterality: Left;  . TOTAL KNEE ARTHROPLASTY Right 10/01/2015   Procedure: RIGHT TOTAL KNEE ARTHROPLASTY;  Surgeon: [name], MD;   Location: [facility];  Service: Orthopedics;  Laterality: Right;    Family Psychiatric History:  Father- depression, son age 67 - bipolar, schizophrenia,   Family History:  Family History  Problem Relation Age of Onset  . Cancer Mother   . Cancer Brother   . Diabetes Brother   . Cancer Maternal Aunt   . Diabetes Maternal Aunt     Social History:  Social History   Social History  . Marital status: Married    Spouse name: [name]  . Number of children: 2  . Years of education: 12 +   Occupational History  . unemployed Other   Social History Main Topics  . Smoking status: Never Smoker  . Smokeless tobacco: Never Used  . Alcohol use No     Comment: 12-25-2015 per pt no  . Drug use: No     Comment: 21-12-2015 per pt no   . Sexual activity: Not Currently    Birth control/ protection: Surgical   Other Topics Concern  . Not on file   Social History Narrative   Patient is married [name]) and lives at home with her husband.   Patient has two adult children.   Patient is disabled.   Patient has a high school education and some trade school.   Patient is right-handed.   Patient drinks four glasses of soda and tea daily.   Lives with  her husband of 31 years, and son's family, have two children Work: review record until 2004, currently on disability Education: high school  Allergies:  Allergies  Allergen Reactions  . Acetaminophen Other (See Comments)    Has fatty deposits on liver  . Benadryl [Diphenhydramine Hcl] Other (See Comments)    hyperactivity and seizures  . Codeine Itching and Rash    seizures  . Dilantin [Phenytoin Sodium Extended] Other (See Comments)    Elevated LFT's  . Melatonin Other (See Comments)    seizures  . Ultram [Tramadol Hcl] Other (See Comments)    Seizures  . Vimpat [Lacosamide] Other (See Comments)    Severe dizziness  . Betadine [Povidone Iodine] Rash  . Latex Rash  . Penicillins Itching and Rash    Has patient had a PCN reaction  causing immediate rash, facial/tongue/throat swelling, SOB or lightheadedness with hypotension: No Has patient had a PCN reaction causing severe rash involving mucus membranes or skin necrosis: No Has patient had a PCN reaction that required hospitalization No Has patient had a PCN reaction occurring within the last 10 years: No If all of the above answers are "NO", then may proceed with Cephalosporin use.  . Sulfa Antibiotics Rash  . Tape Rash    Paper tape please  . Vicodin [Hydrocodone-Acetaminophen] Itching    Metabolic Disorder Labs: Lab Results  Component Value Date   HGBA1C 5.4 11/28/2015   MPG 108 11/28/2015   No results found for: PROLACTIN Lab Results  Component Value Date   CHOL 217 (H) 04/09/2015   TRIG 156 (H) 04/09/2015   HDL 50 04/09/2015   CHOLHDL 4.3 04/09/2015   LDLCALC 136 (H) 04/09/2015     Current Medications: Current Outpatient Prescriptions  Medication Sig Dispense Refill  . benzonatate (TESSALON) 200 MG capsule Take 1 capsule (200 mg total) by mouth 3 (three) times daily as needed. 30 capsule 1  . clonazePAM (KLONOPIN) 0.5 MG tablet Take 1 tablet (0.5 mg total) by mouth 2 (two) times daily as needed for anxiety. 60 tablet 1  . doxycycline (VIBRAMYCIN) 100 MG capsule Take 1 capsule (100 mg total) by mouth 2 (two) times daily. 20 capsule 0  . FLUoxetine (PROZAC) 20 MG tablet Start 20 mg daily for one week, then 40 mg daily 60 tablet 1  . gabapentin (NEURONTIN) 600 MG tablet TAKE ONE TABLET BY MOUTH THREE TIMES DAILY 90 tablet 2  . hydrOXYzine (ATARAX/VISTARIL) 25 MG tablet Take 1 tablet (25 mg total) by mouth every 6 (six) hours as needed for itching. (Patient not taking: Reported on 12/25/2015) 50 tablet 0  . lamoTRIgine (LAMICTAL) 100 MG tablet TAKE ONE TABLET BY MOUTH TWICE DAILY 56 tablet 6  . levETIRAcetam (KEPPRA) 500 MG tablet Take 3 tablets PO in the morning and 3 tablets PO in the evening. (Patient taking differently: Take 1,500 mg by mouth 2 (two)  times daily. Take 3 tablets PO in the morning and 3 tablets PO in the evening.) 180 tablet 5  . Multiple Vitamin (MULTIVITAMIN WITH MINERALS) TABS tablet Take 1 tablet by mouth daily.    . pramipexole (MIRAPEX) 0.5 MG tablet TAKE ONE TABLET BY MOUTH TWICE DAILY 60 tablet 5  . predniSONE (DELTASONE) 10 MG tablet Take 5 daily for 2 days followed by 4,3,2 and 1 for 2 days each. 30 tablet 0  . traZODone (DESYREL) 50 MG tablet Take 1-2 tablets (50-100 mg total) by mouth at bedtime as needed for sleep. 60 tablet 5  . triamcinolone cream (  KENALOG) 0.5 % Apply 1 application topically 3 (three) times daily. 30 g 0   No current facility-administered medications for this visit.     Neurologic: Headache: No Seizure: No Paresthesias: No  Musculoskeletal: Strength & Muscle Tone: within normal limits Gait & Station: normal Patient leans: N/A  Psychiatric Specialty Exam: ROS  There were no vitals taken for this visit.There is no height or weight on file to calculate BMI.  General Appearance: Fairly Groomed  Eye Contact:  Good  Speech:  Clear and Coherent  Volume:  Normal  Mood:  {BHH MOOD:22306}  Affect:  {Affect (PAA):22687}  Thought Process:  Coherent and Goal Directed  Orientation:  Full (Time, Place, and Person)  Thought Content: Logical   Suicidal Thoughts:  {ST/HT (PAA):22692}  Homicidal Thoughts:  {ST/HT (PAA):22692}  Memory:  Immediate;   Good Recent;   Good Remote;   Good  Judgement:  {Judgement (PAA):22694}  Insight:  {Insight (PAA):22695}  Psychomotor Activity:  Normal  Concentration:  Concentration: Good and Attention Span: Good  Recall:  Good  Fund of Knowledge: Good  Language: Good  Akathisia:  No  Handed:  Right  AIMS (if indicated):  N/A  Assets:  Communication Skills Desire for Improvement  ADL's:  Intact  Cognition: WNL  Sleep:  ***   Assessment [name] is a 51 year old female with depression, GAD, pseudoseizure, partial symptomatic epilepsy with  complex partial seizures, osteoarthritis, congenital hip dislocation s/p replacement who presents for depression.   # MDD # Bereavement Patient endorses worsening neurovegetative symptoms despite on citalopram. Psychosocial stressors including her husband with leukemia, her son in prison and financial strain. Will cross taper from citalopram to fluoxetine to target her mood. Will increase clonazepam for anxiety; discussed side effects which includes drowsiness, respiratory suppression. She will greatly benefit from supportive therapy for bereavement and/or CBT for depression. Will make a referral to a therapist.   # Insomnia Patient endorses insomnia. Discussed sleep hygiene. Will continue Trazodone. Per chart review, she was evaluated with sleep study and was recommended for pulmonologist with potential benefit from low oxygen. Will discuss at the next visit.   Plan 1. Decrease citalopram 20 mg daily for one week, then discontinue 2. Start fluoxetine 20 mg daily for one week, then 40 mg daily 3. Continue clonazepam 0.5 mg twice a day as needed for anxiety, order 60 tabs with one refill 4. Continue Trazodone 50 mg at night for sleep 5. Return to clinic in January 6. Please contact for therapy: Dr. Alford Highland Schneidmiller  [phone] [address], [place], [place] [address] (Patient is on Gabapentin, Lamictal, Keppra for seizure, Pramipexole for restless leg)  The patient demonstrates the following risk factors for suicide: Chronic risk factors for suicide include: psychiatric disorder of depression and previous suicide attempts of overdosing medication. Acute risk factors for suicide include: unemployment and her husband with terminal illness. Protective factors for this patient include: positive social support, coping skills and hope for the future. Considering these factors, the overall suicide risk at this point appears to be low. Patient is appropriate for outpatient follow  up.  Treatment Plan Summary: Plan as above  Treatment Plan Summary:{[facility] AMB [facility] MD [place] [id number]   Norman Clay, MD 01/29/2016, 12:13 PM

## 2016-01-31 ENCOUNTER — Ambulatory Visit (HOSPITAL_COMMUNITY): Payer: Self-pay | Admitting: Psychiatry

## 2016-02-07 ENCOUNTER — Encounter (HOSPITAL_COMMUNITY): Payer: Self-pay

## 2016-02-07 ENCOUNTER — Ambulatory Visit (HOSPITAL_COMMUNITY): Payer: Self-pay | Admitting: Psychiatry

## 2016-02-12 ENCOUNTER — Encounter (HOSPITAL_COMMUNITY): Payer: Self-pay

## 2016-02-12 ENCOUNTER — Ambulatory Visit (HOSPITAL_COMMUNITY): Payer: Self-pay | Admitting: Psychiatry

## 2016-02-13 ENCOUNTER — Telehealth (HOSPITAL_COMMUNITY): Payer: Self-pay | Admitting: *Deleted

## 2016-02-13 NOTE — Telephone Encounter (Signed)
returned phone call regarding an appointment. left message

## 2016-02-19 NOTE — Progress Notes (Signed)
[place] MD/PA/NP OP Progress Note  02/21/2016 3:11 PM [name]  MRN:  [id number]  Chief Complaint:  Chief Complaint    Depression; Follow-up     Subjective:  "I' feel a lot better" HPI:  Patient presents for follow-up appointment. She states that it was tough for her, her husband was admitted a couple of times since the last appointment. She had crying spells over her husband health issues. She also talks about her son in prison. She believes that her mood significantly improved otherwise, and sleeps very well. She takes clonazepam 1 mg at night. She denies SI. She was unable to see Dr. Lorene Dy due to insurance issues.  Visit Diagnosis:    ICD-9-CM ICD-10-CM   1. Moderate episode of recurrent major depressive disorder (HCC) 296.32 F33.1   2. Bereavement V62.82 Z63.4     Past Psychiatric History:  Outpatient:  [facility],  Psychiatry admission:  in 2004 for suicide attempt (per note, she was also admitted in 09/04/2010-09/06/2010)  Previous suicide attempt: overdosing flexeril in 2004,  Past trials of medication: fluoxetine, citalopram, Trazodone, lorazepam, clonazepam History of violence: denies  Past Medical History:  Past Medical History:  Diagnosis Date  . Arthritis    "knees" (04/22/2012)  . Bronchitis   . Chronic bronchitis ([place])    "yearly; when the weather changes" (04/22/2012)  . Chronic kidney disease   . Colon polyps    adenomatous and hyperplastic-  . Constipation   . Depression   . Eczema   . Epilepsy ([place])    "been having them right often here lately" (04/22/2012)  . Fatty liver   . [id number])    "1/wk" (04/22/2012)  . High cholesterol   . History of kidney stones   . Osteoarthritis    [name] 04/22/2012  . Other convulsions 05/21/12   non-epileptic spells  . Rectal bleed    in toilet- bright red  . Restless leg   . Seizures ([place])   . Vertigo     Past Surgical History:  Procedure Laterality Date  . ABDOMINAL HYSTERECTOMY  2001  . BLADDER  SUSPENSION    . BUNIONECTOMY Left 2000  . CESAREAN SECTION  1987; 1988  . COLONOSCOPY    . JOINT REPLACEMENT    . MASS EXCISION  10/22/2011   Procedure: EXCISION MASS;  Surgeon: [name], MD;  Location: [facility];  Service: General;  Laterality: Right;  excision right buttock mass  . TOTAL HIP ARTHROPLASTY Left 1993; 1995; 2000  . TOTAL KNEE ARTHROPLASTY Left 05/07/2015   Procedure: TOTAL LEFT KNEE ARTHROPLASTY;  Surgeon: [name], MD;  Location: [facility];  Service: Orthopedics;  Laterality: Left;  . TOTAL KNEE ARTHROPLASTY Right 10/01/2015   Procedure: RIGHT TOTAL KNEE ARTHROPLASTY;  Surgeon: [name], MD;  Location: [facility];  Service: Orthopedics;  Laterality: Right;    Family Psychiatric History:  Father- depression, son age 24 - bipolar, schizophrenia,   Family History:  Family History  Problem Relation Age of Onset  . Cancer Mother   . Cancer Brother   . Diabetes Brother   . Cancer Maternal Aunt   . Diabetes Maternal Aunt     Social History:  Social History   Social History  . Marital status: Married    Spouse name: [name]  . Number of children: 2  . Years of education: 12 +   Occupational History  . unemployed Other   Social History Main Topics  . Smoking status: Never Smoker  . Smokeless tobacco: Never  Used  . Alcohol use No     Comment: 12-25-2015 per pt no  . Drug use: No     Comment: 21-12-2015 per pt no   . Sexual activity: Not Currently    Birth control/ protection: Surgical   Other Topics Concern  . None   Social History Narrative   Patient is married [name]) and lives at home with her husband.   Patient has two adult children.   Patient is disabled.   Patient has a high school education and some trade school.   Patient is right-handed.   Patient drinks four glasses of soda and tea daily.   Lives with her husband of 31 years, and son's family, have two children Work: review record until 2004, currently on disability Education: high  school  Allergies:  Allergies  Allergen Reactions  . Acetaminophen Other (See Comments)    Has fatty deposits on liver  . Benadryl [Diphenhydramine Hcl] Other (See Comments)    hyperactivity and seizures  . Codeine Itching and Rash    seizures  . Dilantin [Phenytoin Sodium Extended] Other (See Comments)    Elevated LFT's  . Melatonin Other (See Comments)    seizures  . Ultram [Tramadol Hcl] Other (See Comments)    Seizures  . Vimpat [Lacosamide] Other (See Comments)    Severe dizziness  . Betadine [Povidone Iodine] Rash  . Latex Rash  . Penicillins Itching and Rash    Has patient had a PCN reaction causing immediate rash, facial/tongue/throat swelling, SOB or lightheadedness with hypotension: No Has patient had a PCN reaction causing severe rash involving mucus membranes or skin necrosis: No Has patient had a PCN reaction that required hospitalization No Has patient had a PCN reaction occurring within the last 10 years: No If all of the above answers are "NO", then may proceed with Cephalosporin use.  . Sulfa Antibiotics Rash  . Tape Rash    Paper tape please  . Vicodin [Hydrocodone-Acetaminophen] Itching    Metabolic Disorder Labs: Lab Results  Component Value Date   HGBA1C 5.4 11/28/2015   MPG 108 11/28/2015   No results found for: PROLACTIN Lab Results  Component Value Date   CHOL 217 (H) 04/09/2015   TRIG 156 (H) 04/09/2015   HDL 50 04/09/2015   CHOLHDL 4.3 04/09/2015   LDLCALC 136 (H) 04/09/2015     Current Medications: Current Outpatient Prescriptions  Medication Sig Dispense Refill  . benzonatate (TESSALON) 200 MG capsule Take 1 capsule (200 mg total) by mouth 3 (three) times daily as needed. 30 capsule 1  . clonazePAM (KLONOPIN) 0.5 MG tablet Take 1 tablet (0.5 mg total) by mouth 2 (two) times daily as needed for anxiety. 60 tablet 0  . FLUoxetine (PROZAC) 20 MG tablet Start 20 mg daily for one week, then 40 mg daily 60 tablet 1  . gabapentin (NEURONTIN)  600 MG tablet TAKE ONE TABLET BY MOUTH THREE TIMES DAILY 90 tablet 2  . lamoTRIgine (LAMICTAL) 100 MG tablet TAKE ONE TABLET BY MOUTH TWICE DAILY 56 tablet 6  . levETIRAcetam (KEPPRA) 500 MG tablet Take 3 tablets PO in the morning and 3 tablets PO in the evening. (Patient taking differently: Take 1,500 mg by mouth 2 (two) times daily. Take 3 tablets PO in the morning and 3 tablets PO in the evening.) 180 tablet 5  . Multiple Vitamin (MULTIVITAMIN WITH MINERALS) TABS tablet Take 1 tablet by mouth daily.    . pramipexole (MIRAPEX) 0.5 MG tablet TAKE ONE TABLET BY  MOUTH TWICE DAILY 60 tablet 5  . predniSONE (DELTASONE) 10 MG tablet Take 5 daily for 2 days followed by 4,3,2 and 1 for 2 days each. 30 tablet 0  . traZODone (DESYREL) 50 MG tablet Take 1-2 tablets (50-100 mg total) by mouth at bedtime as needed for sleep. 60 tablet 5   No current facility-administered medications for this visit.     Neurologic: Headache: No Seizure: No Paresthesias: No  Musculoskeletal: Strength & Muscle Tone: within normal limits Gait & Station: normal Patient leans: N/A  Psychiatric Specialty Exam: Review of Systems  Psychiatric/Behavioral: Positive for depression. Negative for hallucinations, substance abuse and suicidal ideas. The patient is nervous/anxious. The patient does not have insomnia.   All other systems reviewed and are negative.   Blood pressure 115/90, pulse 92, height 5' (1.524 m), weight 160 lb 12.8 oz (72.9 kg), SpO2 97 %.Body mass index is 31.4 kg/m.  General Appearance: Fairly Groomed  Eye Contact:  Good  Speech:  Clear and Coherent  Volume:  Normal  Mood:  "better"  Affect:  Appropriate, Congruent and slightly down  Thought Process:  Coherent and Goal Directed  Orientation:  Full (Time, Place, and Person)  Thought Content: Logical Perceptions: denies AH/VH  Suicidal Thoughts:  No  Homicidal Thoughts:  No  Memory:  Immediate;   Good Recent;   Good Remote;   Good  Judgement:   Good  Insight:  Fair  Psychomotor Activity:  Normal  Concentration:  Concentration: Good and Attention Span: Good  Recall:  Good  Fund of Knowledge: Good  Language: Good  Akathisia:  No  Handed:  Right  AIMS (if indicated):  N/A  Assets:  Communication Skills Desire for Improvement  ADL's:  Intact  Cognition: WNL  Sleep:  good   Assessment TOMASINA NAVIA is a 51 year old female with depression, GAD, pseudoseizure, partial symptomatic epilepsy with complex partial seizures, osteoarthritis, congenital hip dislocation s/p replacement who presents for depression. Psychosocial stressors including her husband with leukemia, her son in prison and financial strain.   # MDD # Bereavement There is an improvement in her neurovegetative symptoms after starting fluoxetine. Will continue current dose. Will continue clonazepam for anxiety; discussed side effects which includes drowsiness, respiratory suppression. She will greatly benefit from supportive therapy for bereavement and/or CBT for depression. Will make a referral to a therapist again.   # Insomnia There has been an improvement in her insomnia as her depression improves. Noted that she was evaluated with sleep study and was recommended for pulmonologist with potential benefit from low oxygen. Will discuss at the next visit.   Plan 1. Continue fluoxetine 40 mg daily 2. Continue clonazepam 0.5 mg twice a day as needed for anxiety 3. Continue Trazodone 50 mg at night for sleep 4. Return to clinic in one month 5. Call for appointment at [facility] for therapy [place], [place], [place] [address] (Patient is on Gabapentin, Lamictal, Keppra for seizure, Pramipexole for restless leg)  The patient demonstrates the following risk factors for suicide: Chronic risk factors for suicide include: psychiatric disorder of depression and previous suicide attempts of overdosing medication. Acute risk factors for  suicide include: unemployment and her husband with terminal illness. Protective factors for this patient include: positive social support, coping skills and hope for the future. Considering these factors, the overall suicide risk at this point appears to be low. Patient is appropriate for outpatient follow up.  Treatment Plan Summary: Plan as  above  Norman Clay, MD 02/21/2016, 3:11 PM

## 2016-02-21 ENCOUNTER — Ambulatory Visit (INDEPENDENT_AMBULATORY_CARE_PROVIDER_SITE_OTHER): Payer: PPO | Admitting: Psychiatry

## 2016-02-21 ENCOUNTER — Encounter (HOSPITAL_COMMUNITY): Payer: Self-pay | Admitting: Psychiatry

## 2016-02-21 VITALS — BP 115/90 | HR 92 | Ht 60.0 in | Wt 160.8 lb

## 2016-02-21 DIAGNOSIS — Z634 Disappearance and death of family member: Secondary | ICD-10-CM

## 2016-02-21 DIAGNOSIS — Z808 Family history of malignant neoplasm of other organs or systems: Secondary | ICD-10-CM

## 2016-02-21 DIAGNOSIS — Z833 Family history of diabetes mellitus: Secondary | ICD-10-CM

## 2016-02-21 DIAGNOSIS — Z90710 Acquired absence of both cervix and uterus: Secondary | ICD-10-CM

## 2016-02-21 DIAGNOSIS — F331 Major depressive disorder, recurrent, moderate: Secondary | ICD-10-CM | POA: Diagnosis not present

## 2016-02-21 DIAGNOSIS — Z880 Allergy status to penicillin: Secondary | ICD-10-CM

## 2016-02-21 DIAGNOSIS — Z882 Allergy status to sulfonamides status: Secondary | ICD-10-CM

## 2016-02-21 DIAGNOSIS — Z79899 Other long term (current) drug therapy: Secondary | ICD-10-CM

## 2016-02-21 DIAGNOSIS — Z9889 Other specified postprocedural states: Secondary | ICD-10-CM | POA: Diagnosis not present

## 2016-02-21 DIAGNOSIS — L400 Psoriasis vulgaris: Secondary | ICD-10-CM | POA: Diagnosis not present

## 2016-02-21 DIAGNOSIS — Z91040 Latex allergy status: Secondary | ICD-10-CM

## 2016-02-21 DIAGNOSIS — Z888 Allergy status to other drugs, medicaments and biological substances status: Secondary | ICD-10-CM

## 2016-02-21 MED ORDER — CLONAZEPAM 0.5 MG PO TABS: 0.5000 mg | ORAL_TABLET | Freq: Two times a day (BID) | ORAL | 0 refills | Status: DC | PRN

## 2016-02-21 MED ORDER — FLUOXETINE HCL 20 MG PO TABS: ORAL_TABLET | ORAL | 1 refills | Status: DC

## 2016-02-21 NOTE — Patient Instructions (Signed)
1. Continue fluoxetine 40 mg daily 2. Continue clonazepam 0.5 mg twice a day as needed for anxiety 3. Continue Trazodone 50 mg at night for sleep 4. Return to clinic in one month 5. Call for appointment at [facility] for therapy [phone], [place], [place] [address]

## 2016-02-28 DIAGNOSIS — S40012A Contusion of left shoulder, initial encounter: Secondary | ICD-10-CM | POA: Diagnosis not present

## 2016-02-28 DIAGNOSIS — S40011A Contusion of right shoulder, initial encounter: Secondary | ICD-10-CM | POA: Diagnosis not present

## 2016-02-28 DIAGNOSIS — Z471 Aftercare following joint replacement surgery: Secondary | ICD-10-CM | POA: Diagnosis not present

## 2016-02-28 DIAGNOSIS — Z96651 Presence of right artificial knee joint: Secondary | ICD-10-CM | POA: Diagnosis not present

## 2016-02-28 DIAGNOSIS — Z96653 Presence of artificial knee joint, bilateral: Secondary | ICD-10-CM | POA: Diagnosis not present

## 2016-02-28 DIAGNOSIS — Z96652 Presence of left artificial knee joint: Secondary | ICD-10-CM | POA: Diagnosis not present

## 2016-03-03 ENCOUNTER — Telehealth: Payer: Self-pay | Admitting: Family Medicine

## 2016-03-03 DIAGNOSIS — Z1231 Encounter for screening mammogram for malignant neoplasm of breast: Secondary | ICD-10-CM

## 2016-03-03 NOTE — Telephone Encounter (Signed)
Order placed

## 2016-03-10 ENCOUNTER — Other Ambulatory Visit: Payer: Self-pay | Admitting: Neurology

## 2016-03-10 ENCOUNTER — Encounter (HOSPITAL_COMMUNITY): Payer: Self-pay | Admitting: Psychiatry

## 2016-03-10 ENCOUNTER — Ambulatory Visit (INDEPENDENT_AMBULATORY_CARE_PROVIDER_SITE_OTHER): Payer: PPO | Admitting: Psychiatry

## 2016-03-10 VITALS — BP 120/86 | HR 75 | Ht 60.0 in | Wt 152.2 lb

## 2016-03-10 DIAGNOSIS — F4000 Agoraphobia, unspecified: Secondary | ICD-10-CM | POA: Diagnosis not present

## 2016-03-10 DIAGNOSIS — Z880 Allergy status to penicillin: Secondary | ICD-10-CM | POA: Diagnosis not present

## 2016-03-10 DIAGNOSIS — Z634 Disappearance and death of family member: Secondary | ICD-10-CM

## 2016-03-10 DIAGNOSIS — Z9889 Other specified postprocedural states: Secondary | ICD-10-CM | POA: Diagnosis not present

## 2016-03-10 DIAGNOSIS — G4700 Insomnia, unspecified: Secondary | ICD-10-CM

## 2016-03-10 DIAGNOSIS — Z809 Family history of malignant neoplasm, unspecified: Secondary | ICD-10-CM | POA: Diagnosis not present

## 2016-03-10 DIAGNOSIS — Z91040 Latex allergy status: Secondary | ICD-10-CM

## 2016-03-10 DIAGNOSIS — Z79899 Other long term (current) drug therapy: Secondary | ICD-10-CM

## 2016-03-10 DIAGNOSIS — Z90710 Acquired absence of both cervix and uterus: Secondary | ICD-10-CM

## 2016-03-10 DIAGNOSIS — Z888 Allergy status to other drugs, medicaments and biological substances status: Secondary | ICD-10-CM | POA: Diagnosis not present

## 2016-03-10 DIAGNOSIS — Z833 Family history of diabetes mellitus: Secondary | ICD-10-CM

## 2016-03-10 DIAGNOSIS — Z882 Allergy status to sulfonamides status: Secondary | ICD-10-CM | POA: Diagnosis not present

## 2016-03-10 DIAGNOSIS — F331 Major depressive disorder, recurrent, moderate: Secondary | ICD-10-CM

## 2016-03-10 MED ORDER — CLONAZEPAM 0.5 MG PO TABS: 0.5000 mg | ORAL_TABLET | Freq: Two times a day (BID) | ORAL | 0 refills | Status: DC | PRN

## 2016-03-10 MED ORDER — FLUOXETINE HCL 60 MG PO TABS: 60.0000 mg | ORAL_TABLET | Freq: Every day | ORAL | 1 refills | Status: DC

## 2016-03-10 NOTE — Patient Instructions (Signed)
1. Increase fluoxetine 60 mg daily 2. Continue clonazepam 0.5 mg twice a day as needed for anxiety 3. Continue Trazodone 50 mg at night for sleep 4. Return to clinic in one month

## 2016-03-10 NOTE — Progress Notes (Signed)
[place] MD/PA/NP OP Progress Note  03/10/2016 10:46 AM [name]  MRN:  [id number]  Chief Complaint:  Chief Complaint    Depression; Anxiety; Follow-up     Subjective:  "I was crying for a few days." HPI:  Patient presents for follow-up appointment. She talks about her husband who was admitted and had crying spells for a few days. She could not pay the bills for five credit cards, feeling overwhelmed. She had passive SI at that time. She sleeps better with current medication. She endorses fatigue. She feels anxious, feeling that "everybody is coming toward her." She feels it worsened lately. She denies AH/VH.   Visit Diagnosis:    ICD-9-CM ICD-10-CM   1. Moderate episode of recurrent major depressive disorder (HCC) 296.32 F33.1   2. Bereavement V62.82 Z63.4     Past Psychiatric History:  Outpatient:  [facility],  Psychiatry admission:  in 2004 for suicide attempt (per note, she was also admitted in 09/04/2010-09/06/2010)  Previous suicide attempt: overdosing flexeril in 2004,  Past trials of medication: fluoxetine, citalopram, Trazodone, lorazepam, clonazepam History of violence: denies  Past Medical History:  Past Medical History:  Diagnosis Date  . Arthritis    "knees" (04/22/2012)  . Bronchitis   . Chronic bronchitis ([place])    "yearly; when the weather changes" (04/22/2012)  . Chronic kidney disease   . Colon polyps    adenomatous and hyperplastic-  . Constipation   . Depression   . Eczema   . Epilepsy ([place])    "been having them right often here lately" (04/22/2012)  . Fatty liver   . [id number])    "1/wk" (04/22/2012)  . High cholesterol   . History of kidney stones   . Osteoarthritis    [name] 04/22/2012  . Other convulsions 05/21/12   non-epileptic spells  . Rectal bleed    in toilet- bright red  . Restless leg   . Seizures ([place])   . Vertigo     Past Surgical History:  Procedure Laterality Date  . ABDOMINAL HYSTERECTOMY  2001  . BLADDER SUSPENSION    .  BUNIONECTOMY Left 2000  . CESAREAN SECTION  1987; 1988  . COLONOSCOPY    . JOINT REPLACEMENT    . MASS EXCISION  10/22/2011   Procedure: EXCISION MASS;  Surgeon: [name], MD;  Location: [facility];  Service: General;  Laterality: Right;  excision right buttock mass  . TOTAL HIP ARTHROPLASTY Left 1993; 1995; 2000  . TOTAL KNEE ARTHROPLASTY Left 05/07/2015   Procedure: TOTAL LEFT KNEE ARTHROPLASTY;  Surgeon: [name], MD;  Location: [facility];  Service: Orthopedics;  Laterality: Left;  . TOTAL KNEE ARTHROPLASTY Right 10/01/2015   Procedure: RIGHT TOTAL KNEE ARTHROPLASTY;  Surgeon: [name], MD;  Location: [facility];  Service: Orthopedics;  Laterality: Right;    Family Psychiatric History:  Father- depression, son age 30 - bipolar, schizophrenia,   Family History:  Family History  Problem Relation Age of Onset  . Cancer Mother   . Cancer Brother   . Diabetes Brother   . Cancer Maternal Aunt   . Diabetes Maternal Aunt     Social History:  Social History   Social History  . Marital status: Married    Spouse name: [name]  . Number of children: 2  . Years of education: 12 +   Occupational History  . unemployed Other   Social History Main Topics  . Smoking status: Never Smoker  . Smokeless tobacco: Never Used  . Alcohol  use No     Comment: 12-25-2015 per pt no  . Drug use: No     Comment: 21-12-2015 per pt no   . Sexual activity: Not Currently    Birth control/ protection: Surgical   Other Topics Concern  . None   Social History Narrative   Patient is married [name]) and lives at home with her husband.   Patient has two adult children.   Patient is disabled.   Patient has a high school education and some trade school.   Patient is right-handed.   Patient drinks four glasses of soda and tea daily.   Lives with her husband of 31 years, and son's family, have two children Work: review record until 2004, currently on disability Education: high  school  Allergies:  Allergies  Allergen Reactions  . Acetaminophen Other (See Comments)    Has fatty deposits on liver  . Benadryl [Diphenhydramine Hcl] Other (See Comments)    hyperactivity and seizures  . Codeine Itching and Rash    seizures  . Dilantin [Phenytoin Sodium Extended] Other (See Comments)    Elevated LFT's  . Melatonin Other (See Comments)    seizures  . Ultram [Tramadol Hcl] Other (See Comments)    Seizures  . Vimpat [Lacosamide] Other (See Comments)    Severe dizziness  . Betadine [Povidone Iodine] Rash  . Latex Rash  . Penicillins Itching and Rash    Has patient had a PCN reaction causing immediate rash, facial/tongue/throat swelling, SOB or lightheadedness with hypotension: No Has patient had a PCN reaction causing severe rash involving mucus membranes or skin necrosis: No Has patient had a PCN reaction that required hospitalization No Has patient had a PCN reaction occurring within the last 10 years: No If all of the above answers are "NO", then may proceed with Cephalosporin use.  . Sulfa Antibiotics Rash  . Tape Rash    Paper tape please  . Vicodin [Hydrocodone-Acetaminophen] Itching    Metabolic Disorder Labs: Lab Results  Component Value Date   HGBA1C 5.4 11/28/2015   MPG 108 11/28/2015   No results found for: PROLACTIN Lab Results  Component Value Date   CHOL 217 (H) 04/09/2015   TRIG 156 (H) 04/09/2015   HDL 50 04/09/2015   CHOLHDL 4.3 04/09/2015   LDLCALC 136 (H) 04/09/2015     Current Medications: Current Outpatient Prescriptions  Medication Sig Dispense Refill  . benzonatate (TESSALON) 200 MG capsule Take 1 capsule (200 mg total) by mouth 3 (three) times daily as needed. 30 capsule 1  . clonazePAM (KLONOPIN) 0.5 MG tablet Take 1 tablet (0.5 mg total) by mouth 2 (two) times daily as needed for anxiety. 60 tablet 0  . FLUoxetine 60 MG TABS Take 60 mg by mouth daily. 30 tablet 1  . gabapentin (NEURONTIN) 600 MG tablet TAKE ONE TABLET BY  MOUTH THREE TIMES DAILY 90 tablet 2  . lamoTRIgine (LAMICTAL) 100 MG tablet TAKE ONE TABLET BY MOUTH TWICE DAILY 56 tablet 6  . levETIRAcetam (KEPPRA) 500 MG tablet Take 3 tablets PO in the morning and 3 tablets PO in the evening. (Patient taking differently: Take 1,500 mg by mouth 2 (two) times daily. Take 3 tablets PO in the morning and 3 tablets PO in the evening.) 180 tablet 5  . Multiple Vitamin (MULTIVITAMIN WITH MINERALS) TABS tablet Take 1 tablet by mouth daily.    . pramipexole (MIRAPEX) 0.5 MG tablet TAKE ONE TABLET BY MOUTH TWICE DAILY 60 tablet 5  . predniSONE (DELTASONE)  10 MG tablet Take 5 daily for 2 days followed by 4,3,2 and 1 for 2 days each. 30 tablet 0  . traZODone (DESYREL) 50 MG tablet Take 1-2 tablets (50-100 mg total) by mouth at bedtime as needed for sleep. 60 tablet 5   No current facility-administered medications for this visit.     Neurologic: Headache: No Seizure: No Paresthesias: No  Musculoskeletal: Strength & Muscle Tone: within normal limits Gait & Station: normal Patient leans: N/A  Psychiatric Specialty Exam: Review of Systems  Psychiatric/Behavioral: Positive for depression. Negative for hallucinations, substance abuse and suicidal ideas. The patient is nervous/anxious. The patient does not have insomnia.   All other systems reviewed and are negative.   Blood pressure 120/86, pulse 75, height 5' (1.524 m), weight 152 lb 3.2 oz (69 kg), SpO2 92 %.Body mass index is 29.72 kg/m.  General Appearance: Fairly Groomed  Eye Contact:  Good  Speech:  Clear and Coherent  Volume:  Normal  Mood:  "better"  Affect:  Appropriate, Congruent and slightly down  Thought Process:  Coherent and Goal Directed  Orientation:  Full (Time, Place, and Person)  Thought Content: Logical Perceptions: denies AH/VH  Suicidal Thoughts:  No  Homicidal Thoughts:  No  Memory:  Immediate;   Good Recent;   Good Remote;   Good  Judgement:  Good  Insight:  Fair  Psychomotor  Activity:  Normal  Concentration:  Concentration: Good and Attention Span: Good  Recall:  Good  Fund of Knowledge: Good  Language: Good  Akathisia:  No  Handed:  Right  AIMS (if indicated):  N/A  Assets:  Communication Skills Desire for Improvement  ADL's:  Intact  Cognition: WNL  Sleep:  good   Assessment TALIESHA ACOSTA is a 51 year old female with depression, GAD, pseudoseizure, partial symptomatic epilepsy with complex partial seizures, osteoarthritis, congenital hip dislocation s/p replacement who presents for depression. Psychosocial stressors including her husband with leukemia, her son in prison and financial strain.   # MDD # Bereavement Patient continues to have crying spells and worsening anxiety since her husband was admitted. Will increase fluoxetine to optimize its effect. Will continue clonazepam for anxiety; discussed side effects which includes drowsiness, respiratory suppression. She will greatly benefit from supportive therapy for bereavement and/or CBT for depression. Referral is made and she would see a therapist after this visit today.  # Insomnia There has been an improvement in her insomnia since the last visit. Noted that she was evaluated with sleep study and was recommended for pulmonologist with potential benefit from low oxygen. Will discuss at the next visit.   Plan 1. Increase fluoxetine 60 mg daily 2. Continue clonazepam 0.5 mg twice a day as needed for anxiety 3. Continue Trazodone 50 mg at night for sleep 4. Return to clinic in one month (Patient is on Gabapentin, Lamictal, Keppra for seizure, Pramipexole for restless leg)  The patient demonstrates the following risk factors for suicide: Chronic risk factors for suicide include: psychiatric disorder of depression and previous suicide attempts of overdosing medication. Acute risk factors for suicide include: unemployment and her husband with terminal illness. Protective factors for this patient  include: positive social support, coping skills and hope for the future. Considering these factors, the overall suicide risk at this point appears to be low. Patient is appropriate for outpatient follow up.  Treatment Plan Summary: Plan as above  Norman Clay, MD 03/10/2016, 10:46 AM

## 2016-03-24 ENCOUNTER — Telehealth: Payer: Self-pay

## 2016-03-24 ENCOUNTER — Ambulatory Visit: Payer: PPO | Admitting: Neurology

## 2016-03-24 NOTE — Telephone Encounter (Signed)
I called pt. I advised her that her appt today will need to be cancelled due to the weather. Pt is agreeable to a new appt on 04/09/16 at 10:00am. Pt verbalized understanding of new appt date and time.

## 2016-03-26 ENCOUNTER — Telehealth: Payer: Self-pay | Admitting: Family Medicine

## 2016-03-26 NOTE — Telephone Encounter (Signed)
lmtcb

## 2016-03-26 NOTE — Telephone Encounter (Signed)
Pt is returning your call

## 2016-03-26 NOTE — Telephone Encounter (Signed)
Appointment made 3/16 with [facility].

## 2016-03-28 ENCOUNTER — Ambulatory Visit (INDEPENDENT_AMBULATORY_CARE_PROVIDER_SITE_OTHER): Payer: PPO

## 2016-03-28 ENCOUNTER — Ambulatory Visit (INDEPENDENT_AMBULATORY_CARE_PROVIDER_SITE_OTHER): Payer: PPO | Admitting: Family Medicine

## 2016-03-28 VITALS — BP 94/64 | HR 79 | Temp 98.1°F | Ht 60.0 in | Wt 155.0 lb

## 2016-03-28 DIAGNOSIS — R109 Unspecified abdominal pain: Secondary | ICD-10-CM | POA: Diagnosis not present

## 2016-03-28 MED ORDER — LEVOFLOXACIN 500 MG PO TABS: 500.0000 mg | ORAL_TABLET | Freq: Every day | ORAL | 0 refills | Status: DC

## 2016-03-28 NOTE — Progress Notes (Signed)
Subjective:  Patient ID: [name], female    DOB: 15-Sep-1965  Age: 51 y.o. MRN: [id number]  CC: Back Pain (pt here today c/o flank pain and upper back pain only when taking a deep breath which she think is associated with her recent diagnosis of pneumonia)   HPI CLEMENTINE SOULLIERE presents for Pain at the left costal margin at the midaxillary line. Patient says it is worse with taking a deep breath. She has an occasional cough which will exacerbate it. She is a little short of breath as well. She denies fever chills and sweats. She is not bringing up anything with the cough. The pain is reminiscent of the pain she had when she had pneumonia about 4 months ago. She is concerned that that could be coming back. Additionally the patient says that she's having some pressure trying to urinate. She had a urinary sling put in about 10 years ago and sometimes has to have a urologist drain the urine for her. She is able to urinate as a rule but could not today for a specimen. She admits not drinking much fluid.   History Jaylenne has a past medical history of Arthritis; Bronchitis; Chronic bronchitis ([place]); Chronic kidney disease; Colon polyps; Constipation; Depression; Eczema; Epilepsy ([place]); Fatty liver; Headache(784.0); High cholesterol; History of kidney stones; Osteoarthritis; Other convulsions (05/21/12); Rectal bleed; Restless leg; Seizures ([place]); and Vertigo.   She has a past surgical history that includes Total hip arthroplasty (Left, 1993; 1995; 2000); Abdominal hysterectomy (2001); Colonoscopy; Joint replacement; Mass excision (10/22/2011); Bladder suspension; Cesarean section (1962; 1988); Bunionectomy (Left, 2000); Total knee arthroplasty (Left, 05/07/2015); and Total knee arthroplasty (Right, 10/01/2015).   Her family history includes Cancer in her brother, maternal aunt, and mother; Diabetes in her brother and maternal aunt.She reports that she has never smoked. She has never used smokeless tobacco.  She reports that she does not drink alcohol or use drugs.    ROS Review of Systems  Constitutional: Negative for activity change, appetite change and fever.  HENT: Negative for congestion, rhinorrhea and sore throat.   Eyes: Negative for visual disturbance.  Respiratory: Negative for cough and shortness of breath.   Cardiovascular: Negative for chest pain and palpitations.  Gastrointestinal: Negative for abdominal pain, diarrhea and nausea.  Genitourinary: Negative for dysuria.  Musculoskeletal: Negative for arthralgias and myalgias.    Objective:  BP 94/64   Pulse 79   Temp 98.1 F (36.7 C) (Oral)   Ht 5' (1.524 m)   Wt 155 lb (70.3 kg)   SpO2 95%   BMI 30.27 kg/m   BP Readings from Last 3 Encounters:  03/28/16 94/64  12/19/15 103/76  12/11/15 113/78    Wt Readings from Last 3 Encounters:  03/28/16 155 lb (70.3 kg)  12/19/15 164 lb 9.6 oz (74.7 kg)  12/11/15 162 lb (73.5 kg)     Physical Exam  Constitutional: She is oriented to person, place, and time. She appears well-developed and well-nourished. No distress.  HENT:  Head: Normocephalic and atraumatic.  Eyes: Conjunctivae are normal. Pupils are equal, round, and reactive to light.  Neck: Normal range of motion. Neck supple. No thyromegaly present.  Cardiovascular: Normal rate, regular rhythm and normal heart sounds.   No murmur heard. Pulmonary/Chest: Effort normal and breath sounds normal. No respiratory distress. She has no wheezes. She has no rales.  Abdominal: Soft. Bowel sounds are normal. She exhibits no distension. There is tenderness (at costal margin and left post ax. line).  Musculoskeletal: Normal range of motion.  Lymphadenopathy:    She has no cervical adenopathy.  Neurological: She is alert and oriented to person, place, and time.  Skin: Skin is warm and dry.  Psychiatric: She has a normal mood and affect. Her behavior is normal. Judgment and thought content normal.      Assessment & Plan:     Katilyn was seen today for back pain.  Diagnoses and all orders for this visit:  Flank pain -     Urinalysis, Complete -     DG Chest 2 View; Future  Other orders -     levofloxacin (LEVAQUIN) 500 MG tablet; Take 1 tablet (500 mg total) by mouth daily.       I have discontinued Ms. Snelson's benzonatate and predniSONE. I am also having her start on levofloxacin. Additionally, I am having her maintain her levETIRAcetam, pramipexole, traZODone, multivitamin with minerals, gabapentin, clonazePAM, FLUoxetine HCl, and lamoTRIgine.  Allergies as of 03/28/2016      Reactions   Acetaminophen Other (See Comments)   Has fatty deposits on liver   Benadryl [diphenhydramine Hcl] Other (See Comments)   hyperactivity and seizures   Codeine Itching, Rash   seizures   Dilantin [phenytoin Sodium Extended] Other (See Comments)   Elevated LFT's   Melatonin Other (See Comments)   seizures   Ultram [tramadol Hcl] Other (See Comments)   Seizures   Vimpat [lacosamide] Other (See Comments)   Severe dizziness   Betadine [povidone Iodine] Rash   Latex Rash   Penicillins Itching, Rash   Has patient had a PCN reaction causing immediate rash, facial/tongue/throat swelling, SOB or lightheadedness with hypotension: No Has patient had a PCN reaction causing severe rash involving mucus membranes or skin necrosis: No Has patient had a PCN reaction that required hospitalization No Has patient had a PCN reaction occurring within the last 10 years: No If all of the above answers are "NO", then may proceed with Cephalosporin use.   Sulfa Antibiotics Rash   Tape Rash   Paper tape please   Vicodin [hydrocodone-acetaminophen] Itching      Medication List       Accurate as of 03/28/16  4:39 PM. Always use your most recent med list.          clonazePAM 0.5 MG tablet Commonly known as:  KLONOPIN Take 1 tablet (0.5 mg total) by mouth 2 (two) times daily as needed for anxiety.   FLUoxetine HCl 60 MG  Tabs Take 60 mg by mouth daily.   gabapentin 600 MG tablet Commonly known as:  NEURONTIN TAKE ONE TABLET BY MOUTH THREE TIMES DAILY   lamoTRIgine 100 MG tablet Commonly known as:  LAMICTAL TAKE ONE TABLET BY MOUTH TWICE DAILY   levETIRAcetam 500 MG tablet Commonly known as:  KEPPRA Take 3 tablets PO in the morning and 3 tablets PO in the evening.   levofloxacin 500 MG tablet Commonly known as:  LEVAQUIN Take 1 tablet (500 mg total) by mouth daily.   multivitamin with minerals Tabs tablet Take 1 tablet by mouth daily.   pramipexole 0.5 MG tablet Commonly known as:  MIRAPEX TAKE ONE TABLET BY MOUTH TWICE DAILY   traZODone 50 MG tablet Commonly known as:  DESYREL Take 1-2 tablets (50-100 mg total) by mouth at bedtime as needed for sleep.      X-ray negative for infiltrate. Patient could not provide a urine specimen.  Follow-up: Return if symptoms worsen or fail to improve.  [name], M.D.

## 2016-03-29 NOTE — Progress Notes (Signed)
Your chest x-ray looked normal. Thanks, WS.

## 2016-03-31 ENCOUNTER — Other Ambulatory Visit: Payer: Self-pay | Admitting: *Deleted

## 2016-04-01 ENCOUNTER — Other Ambulatory Visit: Payer: Self-pay | Admitting: Neurology

## 2016-04-03 NOTE — Progress Notes (Signed)
[facility] MD/PA/NP OP Progress Note  04/07/2016 10:30 AM [name]  MRN:  [id number]  Chief Complaint:  Chief Complaint    Depression; Follow-up     Subjective:  "I am doing better" HPI:  Patient presents for follow-up appointment. She reports that she is doing well since the last appointment. She talks about her husband, whose hemoglobin was 8.0's. She states that "I know that is going to happen." Although she feels that her grandchildren should not see how he is, they helped him a lot for his care. She denies insomnia. Although she feels fatigue and has slight anxiety, she is able to handle things. She denies SI/HI. She takes clonazepam 2 tabs at night. She endorses hip pain where she underwent surgery. She denies restless leg.   Visit Diagnosis:    ICD-9-CM ICD-10-CM   1. Moderate episode of recurrent major depressive disorder ([place]) 296.32 F33.1     Past Psychiatric History:  Outpatient:  [facility],  Psychiatry admission:  in 2004 for suicide attempt (per note, she was also admitted in 09/04/2010-09/06/2010)  Previous suicide attempt: overdosing flexeril in 2004,  Past trials of medication: fluoxetine, citalopram, Trazodone, lorazepam, clonazepam History of violence: denies  Past Medical History:  Past Medical History:  Diagnosis Date  . Arthritis    "knees" (04/22/2012)  . Bronchitis   . Chronic bronchitis ([place])    "yearly; when the weather changes" (04/22/2012)  . Chronic kidney disease   . Colon polyps    adenomatous and hyperplastic-  . Constipation   . Depression   . Eczema   . Epilepsy ([place])    "been having them right often here lately" (04/22/2012)  . Fatty liver   . LYYTKPTW(656.8)    "1/wk" (04/22/2012)  . High cholesterol   . History of kidney stones   . Osteoarthritis    [name] 04/22/2012  . Other convulsions 05/21/12   non-epileptic spells  . Rectal bleed    in toilet- bright red  . Restless leg   . Seizures ([place])   . Vertigo     Past Surgical History:   Procedure Laterality Date  . ABDOMINAL HYSTERECTOMY  2001  . BLADDER SUSPENSION    . BUNIONECTOMY Left 2000  . CESAREAN SECTION  1987; 1988  . COLONOSCOPY    . JOINT REPLACEMENT    . MASS EXCISION  10/22/2011   Procedure: EXCISION MASS;  Surgeon: [name], MD;  Location: [facility];  Service: General;  Laterality: Right;  excision right buttock mass  . TOTAL HIP ARTHROPLASTY Left 1993; 1995; 2000  . TOTAL KNEE ARTHROPLASTY Left 05/07/2015   Procedure: TOTAL LEFT KNEE ARTHROPLASTY;  Surgeon: [name], MD;  Location: [facility];  Service: Orthopedics;  Laterality: Left;  . TOTAL KNEE ARTHROPLASTY Right 10/01/2015   Procedure: RIGHT TOTAL KNEE ARTHROPLASTY;  Surgeon: [name], MD;  Location: [facility];  Service: Orthopedics;  Laterality: Right;    Family Psychiatric History:  Father- depression, son age 3 - bipolar, schizophrenia,   Family History:  Family History  Problem Relation Age of Onset  . Cancer Mother   . Cancer Brother   . Diabetes Brother   . Cancer Maternal Aunt   . Diabetes Maternal Aunt     Social History:  Social History   Social History  . Marital status: Married    Spouse name: [name]  . Number of children: 2  . Years of education: 12 +   Occupational History  . unemployed Other   Social History  Main Topics  . Smoking status: Never Smoker  . Smokeless tobacco: Never Used  . Alcohol use No     Comment: 12-25-2015 per pt no  . Drug use: No     Comment: 21-12-2015 per pt no   . Sexual activity: Not Currently    Birth control/ protection: Surgical   Other Topics Concern  . None   Social History Narrative   Patient is married [name]) and lives at home with her husband.   Patient has two adult children.   Patient is disabled.   Patient has a high school education and some trade school.   Patient is right-handed.   Patient drinks four glasses of soda and tea daily.   Lives with her husband of 31 years, and son's family, have two  children Work: review record until 2004, currently on disability Education: high school  Allergies:  Allergies  Allergen Reactions  . Acetaminophen Other (See Comments)    Has fatty deposits on liver  . Benadryl [Diphenhydramine Hcl] Other (See Comments)    hyperactivity and seizures  . Codeine Itching and Rash    seizures  . Dilantin [Phenytoin Sodium Extended] Other (See Comments)    Elevated LFT's  . Melatonin Other (See Comments)    seizures  . Ultram [Tramadol Hcl] Other (See Comments)    Seizures  . Vimpat [Lacosamide] Other (See Comments)    Severe dizziness  . Betadine [Povidone Iodine] Rash  . Latex Rash  . Penicillins Itching and Rash    Has patient had a PCN reaction causing immediate rash, facial/tongue/throat swelling, SOB or lightheadedness with hypotension: No Has patient had a PCN reaction causing severe rash involving mucus membranes or skin necrosis: No Has patient had a PCN reaction that required hospitalization No Has patient had a PCN reaction occurring within the last 10 years: No If all of the above answers are "NO", then may proceed with Cephalosporin use.  . Sulfa Antibiotics Rash  . Tape Rash    Paper tape please  . Vicodin [Hydrocodone-Acetaminophen] Itching    Metabolic Disorder Labs: Lab Results  Component Value Date   HGBA1C 5.4 11/28/2015   MPG 108 11/28/2015   No results found for: PROLACTIN Lab Results  Component Value Date   CHOL 217 (H) 04/09/2015   TRIG 156 (H) 04/09/2015   HDL 50 04/09/2015   CHOLHDL 4.3 04/09/2015   LDLCALC 136 (H) 04/09/2015     Current Medications: Current Outpatient Prescriptions  Medication Sig Dispense Refill  . clonazePAM (KLONOPIN) 0.5 MG tablet Take 1 tablet (0.5 mg total) by mouth at bedtime as needed for anxiety. 60 tablet 0  . FLUoxetine HCl 60 MG TABS Take 60 mg by mouth daily. 30 tablet 1  . gabapentin (NEURONTIN) 600 MG tablet TAKE ONE TABLET BY MOUTH THREE TIMES DAILY 90 tablet 2  .  lamoTRIgine (LAMICTAL) 100 MG tablet TAKE ONE TABLET BY MOUTH TWICE DAILY 60 tablet 11  . levETIRAcetam (KEPPRA) 500 MG tablet Take 3 tablets (1,500 mg total) by mouth 2 (two) times daily. 180 tablet 5  . Multiple Vitamin (MULTIVITAMIN WITH MINERALS) TABS tablet Take 1 tablet by mouth daily.    . pramipexole (MIRAPEX) 0.5 MG tablet TAKE ONE TABLET BY MOUTH TWICE DAILY 60 tablet 5  . traZODone (DESYREL) 50 MG tablet Take 1-2 tablets (50-100 mg total) by mouth at bedtime as needed for sleep. 60 tablet 5   No current facility-administered medications for this visit.     Neurologic: Headache: No  Seizure: No Paresthesias: No  Musculoskeletal: Strength & Muscle Tone: within normal limits Gait & Station: slightly unsteady due to pain Patient leans: N/A  Psychiatric Specialty Exam: Review of Systems  Musculoskeletal: Positive for joint pain.  Psychiatric/Behavioral: Positive for depression. Negative for hallucinations, substance abuse and suicidal ideas. The patient is nervous/anxious. The patient does not have insomnia.   All other systems reviewed and are negative.   Blood pressure 109/71, pulse 75, height 5' (1.524 m), weight 155 lb (70.3 kg).Body mass index is 30.27 kg/m.  General Appearance: Fairly Groomed  Eye Contact:  Good  Speech:  Clear and Coherent  Volume:  Normal  Mood:  "better"  Affect:  Appropriate, Congruent and slightly down  Thought Process:  Coherent and Goal Directed  Orientation:  Full (Time, Place, and Person)  Thought Content: Logical Perceptions: denies AH/VH  Suicidal Thoughts:  No  Homicidal Thoughts:  No  Memory:  Immediate;   Good Recent;   Good Remote;   Good  Judgement:  Good  Insight:  Fair  Psychomotor Activity:  Normal  Concentration:  Concentration: Good and Attention Span: Good  Recall:  Good  Fund of Knowledge: Good  Language: Good  Akathisia:  No  Handed:  Right  AIMS (if indicated):  N/A  Assets:  Communication Skills Desire for  Improvement  ADL's:  Intact  Cognition: WNL  Sleep:  good   Assessment CASIDEE JANN is a 51 year old female with depression, GAD, pseudoseizure, partial symptomatic epilepsy with complex partial seizures, osteoarthritis, congenital hip dislocation s/p replacement who presents for follow up appointment for depression. Psychosocial stressors including her husband with leukemia, her son in prison and financial strain.   # MDD # Bereavement There is overall improvement in her neurovegetative symptoms since uptitration of fluoxetine. Will continue current dose. Patient is encouraged to decrease the dose of clonazepam.  Discussed side effects which includes drowsiness, respiratory suppression. She will greatly benefit from CBT/supportive therapy to process for loss; she has started to see a therapist.   # Insomnia She denies any insomnia. Noted that she was evaluated with sleep study and was recommended for pulmonologist with potential benefit from low oxygen. Will discuss at the next visit.   Plan 1. Continue fluoxetine 60 mg daily 2. Decrease clonazepam 0.5 mg at night as needed for anxiety (dispensed 60 tabs with no refill) 3. Continue Trazodone 50 mg at night for sleep 4. Return to clinic in one month (Patient is on Gabapentin, Lamictal, Keppra for seizure, Pramipexole for restless leg)  The patient demonstrates the following risk factors for suicide: Chronic risk factors for suicide include: psychiatric disorder of depression and previous suicide attempts of overdosing medication. Acute risk factors for suicide include: unemployment and her husband with terminal illness. Protective factors for this patient include: positive social support, coping skills and hope for the future. Considering these factors, the overall suicide risk at this point appears to be low. Patient is appropriate for outpatient follow up.  Treatment Plan Summary: Plan as above  Norman Clay, MD 04/07/2016, 10:30  AM

## 2016-04-07 ENCOUNTER — Encounter (HOSPITAL_COMMUNITY): Payer: Self-pay | Admitting: Psychiatry

## 2016-04-07 ENCOUNTER — Ambulatory Visit (INDEPENDENT_AMBULATORY_CARE_PROVIDER_SITE_OTHER): Payer: PPO | Admitting: Psychiatry

## 2016-04-07 VITALS — BP 109/71 | HR 75 | Ht 60.0 in | Wt 155.0 lb

## 2016-04-07 DIAGNOSIS — G4700 Insomnia, unspecified: Secondary | ICD-10-CM | POA: Diagnosis not present

## 2016-04-07 DIAGNOSIS — F331 Major depressive disorder, recurrent, moderate: Secondary | ICD-10-CM | POA: Diagnosis not present

## 2016-04-07 DIAGNOSIS — G40909 Epilepsy, unspecified, not intractable, without status epilepticus: Secondary | ICD-10-CM

## 2016-04-07 DIAGNOSIS — Z634 Disappearance and death of family member: Secondary | ICD-10-CM

## 2016-04-07 DIAGNOSIS — M1990 Unspecified osteoarthritis, unspecified site: Secondary | ICD-10-CM | POA: Diagnosis not present

## 2016-04-07 DIAGNOSIS — F411 Generalized anxiety disorder: Secondary | ICD-10-CM

## 2016-04-07 DIAGNOSIS — R569 Unspecified convulsions: Secondary | ICD-10-CM

## 2016-04-07 MED ORDER — FLUOXETINE HCL 60 MG PO TABS: 60.0000 mg | ORAL_TABLET | Freq: Every day | ORAL | 1 refills | Status: DC

## 2016-04-07 MED ORDER — CLONAZEPAM 0.5 MG PO TABS: 0.5000 mg | ORAL_TABLET | Freq: Every evening | ORAL | 0 refills | Status: DC | PRN

## 2016-04-07 NOTE — Patient Instructions (Signed)
1. Continue fluoxetine 60 mg daily 2. Decrease clonazepam 0.5 mg at night as needed for anxiety 3. Continue Trazodone 50 mg at night for sleep 4. Return to clinic in one month

## 2016-04-09 ENCOUNTER — Encounter: Payer: Self-pay | Admitting: Neurology

## 2016-04-09 ENCOUNTER — Ambulatory Visit (INDEPENDENT_AMBULATORY_CARE_PROVIDER_SITE_OTHER): Payer: PPO | Admitting: Neurology

## 2016-04-09 VITALS — BP 113/75 | HR 77 | Resp 20 | Ht 60.0 in | Wt 156.0 lb

## 2016-04-09 DIAGNOSIS — G40209 Localization-related (focal) (partial) symptomatic epilepsy and epileptic syndromes with complex partial seizures, not intractable, without status epilepticus: Secondary | ICD-10-CM | POA: Diagnosis not present

## 2016-04-09 MED ORDER — LAMOTRIGINE 100 MG PO TABS: 100.0000 mg | ORAL_TABLET | Freq: Two times a day (BID) | ORAL | 11 refills | Status: DC

## 2016-04-09 MED ORDER — LEVETIRACETAM 500 MG PO TABS: 1500.0000 mg | ORAL_TABLET | Freq: Two times a day (BID) | ORAL | 11 refills | Status: DC

## 2016-04-09 NOTE — Progress Notes (Signed)
PATIENT: [name] DOB: 11-29-1965  REASON FOR VISIT: follow up- seizures HISTORY FROM: patient  HISTORY OF PRESENT ILLNESS:  03 -28-2018, [name] has recently been prescribed trazodone to help her sleep per her psychiatrist. She has seen Dr. Jannifer Franklin for intractable seizures and a visit about 2 months ago. She continues to take Lamictal, she continues to take Celexa. 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 She reports it is a pain that keeps her from sleeping now. Dr. Maureen Ralphs treats her for her orthopedic symptoms, she also reports that she tried melatonin which has failed, has tried Ambien which causes parasomnia. Her psychiatrist is weaning her off Klonopin. Her psychiatrist in [place]  is weaning her off- she has been taking it for 7 years, started under Dr Erling Cruz.  I explained that I need not to follow up with her, as I have nothing else to offer and her insomnia is secondary to pain.    [name] is a 51 year old female with a history of seizures, for years was Dr. Bernardo Heater patient.  She reports that she had a seizure last week, Saturday.  She states that she's been taking Keppra and Lamictal consistently excpet for Friday night- the missed dose may have provoked the seizure. . Denies missing any medication. She states that after hitting her head she suffered a headache for 2 days however that has resolved. The patient does not operate a motor vehicle. At home she is able to complete all ADLs independent. She is no longer on narcotic medication. She reports that she will be having a second  knee replacement in 4 month. Mr. Pociask has done well with her last full knee replacement in April of this year, she had a hip replacement already 17 years ago, both by Dr.alusio. [name] reports that she has insomnia problems. There is also a report that her husband is very hard of hearing to the TV is very loud during the day and communication  at times difficult. She has regular bedtimes between 9 and 10 PM, she will fall asleep at she will wake up around 2 AM and has great difficulties to resume sleep. She could use melatonin 3 mg at night as a natural supplement to help her stay asleep longer, if this should be to no avail I would consider a low-dose of Klonopin. In a seizure patient I would not like to use Ambien as most benzodiazepines have at least an additional seizure controlling affect. She also has a history of parasomnia / sleepwalking. She usually sleeps on her side, she sleeps on one pillow. The bedroom is cool, quiet and dark. She has a TV in the bedroom but cuts it off when she goes to bed. She is not sure what wakes her at 2 in the morning but it seems to be in early morning ritual and is very difficult for her to reinitiate sleep. Since her husband has noted her to snore but we have no reliable source reporting apnea, I will order an attended sleep study to test her.   HISTORY 02/01/2015 (MM): [name] is a 54 female with a history of seizures. She returns today for an evaluation. The patient had several seizures and was eventually taken to the emergency room. The patient did improve once at the emergency room. She did not have to be intubated. Her Keppra was increased to 1000 mg twice a day. The patient continues on Lamictal.  Since then the patient has not had any additional seizures. The patient's sister-in-law brought to my attention in private that she feels that the patient is taking pain medicine inappropriately. She is wondering if this was the cause of some of her symptoms/seizures when she went to the emergency room. She states that her physicians continue to give her pain medicine for her knee pain even after they have called and requested that she not be given this. Patient reports that she is able to complete ADLs independently. Patient does report ongoing depression. In the past she has been asked to follow-up with  mental health however the patient has failed to do this. She is currently on Celexa.She denies any new neurological symptoms. She returns today for an evaluation.  HISTORY 08/21/14 [facility]):  [name] is here today for follow-up on her subjective memory complaints. A [place] cognitive assessment test was performed today in which she failed on the Trail Making Test and was unable to copy a cube. The contour of her clock was also father droopy but she placed the numbers and hands correctly. She was able to name objects she did very well in all parts of attention testing including serial sevens and repeated the complicated sentences her word fluency however was limited she only generated 8 words she was good as obstruction and fully oriented to place time and date her delayed recall testing showed 4 out of 5 delayed words to be correctly remembered this is a very good result she scored 26 out of 30 points. Patient is high school educated and this would be an normal score at 26-30.  We discussed anecdotal evidence for her spells- her husband reported that a couple of times they have been to the grocery store and she needed to sit down for moment she seems to be mostly claustrophobic when surrounded by a lot of people. This may be more of an anxiety attack. Reports some insomnia, mostly related to being worried about her meanwhile 31 year old son. When she hasn't had a good night sleep she is more likely to be hypersensitive to claustrophobia or to other spells of anxiety or panic. No other "seizures " on the current medication.  REVIEW OF SYSTEMS: Out of a complete 14 system review of symptoms, the patient complains only of the following symptoms, and all other reviewed systems are negative.  Constipation, leg swelling, restless leg, joint pain, aching muscles, walking difficulty, headache, seizures, agitation, depression  ALLERGIES: Allergies  Allergen Reactions  . Acetaminophen Other (See  Comments)    Has fatty deposits on liver  . Benadryl [Diphenhydramine Hcl] Other (See Comments)    hyperactivity and seizures  . Codeine Itching and Rash    seizures  . Dilantin [Phenytoin Sodium Extended] Other (See Comments)    Elevated LFT's  . Melatonin Other (See Comments)    seizures  . Ultram [Tramadol Hcl] Other (See Comments)    Seizures  . Vimpat [Lacosamide] Other (See Comments)    Severe dizziness  . Betadine [Povidone Iodine] Rash  . Latex Rash  . Penicillins Itching and Rash    Has patient had a PCN reaction causing immediate rash, facial/tongue/throat swelling, SOB or lightheadedness with hypotension: No Has patient had a PCN reaction causing severe rash involving mucus membranes or skin necrosis: No Has patient had a PCN reaction that required hospitalization No Has patient had a PCN reaction occurring within the last 10 years: No If all of the above answers are "NO", then may proceed with  Cephalosporin use.  . Sulfa Antibiotics Rash  . Tape Rash    Paper tape please  . Vicodin [Hydrocodone-Acetaminophen] Itching    HOME MEDICATIONS: Outpatient Medications Prior to Visit  Medication Sig Dispense Refill  . clonazePAM (KLONOPIN) 0.5 MG tablet Take 1 tablet (0.5 mg total) by mouth at bedtime as needed for anxiety. 60 tablet 0  . FLUoxetine HCl 60 MG TABS Take 60 mg by mouth daily. 30 tablet 1  . gabapentin (NEURONTIN) 600 MG tablet TAKE ONE TABLET BY MOUTH THREE TIMES DAILY 90 tablet 2  . lamoTRIgine (LAMICTAL) 100 MG tablet TAKE ONE TABLET BY MOUTH TWICE DAILY 60 tablet 11  . levETIRAcetam (KEPPRA) 500 MG tablet Take 3 tablets (1,500 mg total) by mouth 2 (two) times daily. 180 tablet 5  . Multiple Vitamin (MULTIVITAMIN WITH MINERALS) TABS tablet Take 1 tablet by mouth daily.    . pramipexole (MIRAPEX) 0.5 MG tablet TAKE ONE TABLET BY MOUTH TWICE DAILY 60 tablet 5  . traZODone (DESYREL) 50 MG tablet Take 1-2 tablets (50-100 mg total) by mouth at bedtime as needed  for sleep. (Patient taking differently: Take 150 mg by mouth at bedtime as needed for sleep. ) 60 tablet 5   No facility-administered medications prior to visit.     PAST MEDICAL HISTORY: Past Medical History:  Diagnosis Date  . Arthritis    "knees" (04/22/2012)  . Bronchitis   . Chronic bronchitis ([place])    "yearly; when the weather changes" (04/22/2012)  . Chronic kidney disease   . Colon polyps    adenomatous and hyperplastic-  . Constipation   . Depression   . Eczema   . Epilepsy ([place])    "been having them right often here lately" (04/22/2012)  . Fatty liver   . SWNIOEVO(350.0)    "1/wk" (04/22/2012)  . High cholesterol   . History of kidney stones   . Osteoarthritis    [name] 04/22/2012  . Other convulsions 05/21/12   non-epileptic spells  . Rectal bleed    in toilet- bright red  . Restless leg   . Seizures ([place])   . Vertigo     PAST SURGICAL HISTORY: Past Surgical History:  Procedure Laterality Date  . ABDOMINAL HYSTERECTOMY  2001  . BLADDER SUSPENSION    . BUNIONECTOMY Left 2000  . CESAREAN SECTION  1987; 1988  . COLONOSCOPY    . JOINT REPLACEMENT    . MASS EXCISION  10/22/2011   Procedure: EXCISION MASS;  Surgeon: [name], MD;  Location: [facility];  Service: General;  Laterality: Right;  excision right buttock mass  . TOTAL HIP ARTHROPLASTY Left 1993; 1995; 2000  . TOTAL KNEE ARTHROPLASTY Left 05/07/2015   Procedure: TOTAL LEFT KNEE ARTHROPLASTY;  Surgeon: [name], MD;  Location: [facility];  Service: Orthopedics;  Laterality: Left;  . TOTAL KNEE ARTHROPLASTY Right 10/01/2015   Procedure: RIGHT TOTAL KNEE ARTHROPLASTY;  Surgeon: [name], MD;  Location: [facility];  Service: Orthopedics;  Laterality: Right;    FAMILY HISTORY: Family History  Problem Relation Age of Onset  . Cancer Mother   . Cancer Brother   . Diabetes Brother   . Cancer Maternal Aunt   . Diabetes Maternal Aunt     SOCIAL HISTORY: Social History   Social History  . Marital  status: Married    Spouse name: [name]  . Number of children: 2  . Years of education: 12 +   Occupational History  . unemployed Other   Social History  Main Topics  . Smoking status: Never Smoker  . Smokeless tobacco: Never Used  . Alcohol use No     Comment: 12-25-2015 per pt no  . Drug use: No     Comment: 21-12-2015 per pt no   . Sexual activity: Not Currently    Birth control/ protection: Surgical   Other Topics Concern  . Not on file   Social History Narrative   Patient is married [name]) and lives at home with her husband.   Patient has two adult children.   Patient is disabled.   Patient has a high school education and some trade school.   Patient is right-handed.   Patient drinks four glasses of soda and tea daily.      PHYSICAL EXAM  Vitals:   04/09/16 0954  BP: 113/75  Pulse: 77  Resp: 20  Weight: 156 lb (70.8 kg)  Height: 5' (1.524 m)   Body mass index is 30.47 kg/m.  Generalized: Well developed, in no acute distress - slurred speech , dysphasia   Neck circumference is 14.5 inches, the patient's Mallampati is grade 3- her uvula is midline but doesn't lift completely up past the tongue ground, she does have symmetric facial movements, she has a mild ptosis of the right eye.  Neurological examination Mentation: Alert oriented to time, place, history taking. Follows all commands language fluent. Patient's speech is slightly slurred.  She states that this is because she does not have her dentures in family confirms that she has spoken like this for years.   DIAGNOSTIC DATA (LABS, IMAGING, TESTING) - I reviewed patient records, labs, notes, testing and imaging myself where available.   ASSESSMENT AND PLAN:  51 y.o. year old female, looking older than her numeric age, here after another seizure. 30 minutes visit.  She   has a past medical history of Arthritis; Bronchitis; Chronic bronchitis ([place]); Chronic kidney disease; Colon polyps; Constipation;  Depression; Eczema; Epilepsy ([place]); Fatty liver; Headache(784.0); High cholesterol; History of kidney stones; Osteoarthritis; Other convulsions (05/21/12); Rectal bleed; Restless leg; Seizures ([place]); and Vertigo. here with: Complaint of snoring per family, loud. Her husband  Reports this, and he is almost deaf.  She has become his caretaker. He has leukemia, is oxygen dependent from CHF and graft versus host reaction after bone marrow transplant. Their son is in prison, uses CPAP. She had total knee replacement in April and plans the other knee to be replaced in October. Dr. Maureen Ralphs.   1. Seizures, Epilepsy  Once a year RV   The patient had an additional seizure after hitting her head. She has been having speech difficulties for many years, decades. These were not associated with her seizure disorder.  Dr Erling Cruz had followed her for years, assuming she would have 2 or more seizure foci and possibly additional non-epileptic spells. She had been referred for EMU.  I will continue Keppra 500 mg to 3 tablets in the morning and 3 tablets in the evening. She will continue on Lamictal 100 mg twice a day. Her last seizure was 07-21-2015 but confessed she had forgotten a night time dose the day before. I will not address pain and pain related insomnia.     12 month RV   Monica Faxon, MD    04/09/2016, 10:08 AM [facility] [address], [place], [place] [address] [phone]

## 2016-04-10 DIAGNOSIS — F331 Major depressive disorder, recurrent, moderate: Secondary | ICD-10-CM | POA: Diagnosis not present

## 2016-04-10 DIAGNOSIS — F4000 Agoraphobia, unspecified: Secondary | ICD-10-CM | POA: Diagnosis not present

## 2016-04-19 ENCOUNTER — Other Ambulatory Visit: Payer: Self-pay | Admitting: Family Medicine

## 2016-04-30 ENCOUNTER — Other Ambulatory Visit: Payer: Self-pay | Admitting: Family Medicine

## 2016-04-30 ENCOUNTER — Other Ambulatory Visit: Payer: Self-pay | Admitting: Physician Assistant

## 2016-04-30 DIAGNOSIS — F5101 Primary insomnia: Secondary | ICD-10-CM

## 2016-05-02 ENCOUNTER — Encounter (HOSPITAL_COMMUNITY): Payer: Self-pay | Admitting: Psychiatry

## 2016-05-02 ENCOUNTER — Ambulatory Visit (INDEPENDENT_AMBULATORY_CARE_PROVIDER_SITE_OTHER): Payer: PPO | Admitting: Psychiatry

## 2016-05-02 VITALS — BP 95/70 | HR 75 | Ht 60.0 in | Wt 155.0 lb

## 2016-05-02 DIAGNOSIS — Z634 Disappearance and death of family member: Secondary | ICD-10-CM | POA: Diagnosis not present

## 2016-05-02 DIAGNOSIS — Z818 Family history of other mental and behavioral disorders: Secondary | ICD-10-CM

## 2016-05-02 DIAGNOSIS — F331 Major depressive disorder, recurrent, moderate: Secondary | ICD-10-CM | POA: Diagnosis not present

## 2016-05-02 DIAGNOSIS — Z79899 Other long term (current) drug therapy: Secondary | ICD-10-CM

## 2016-05-02 DIAGNOSIS — F411 Generalized anxiety disorder: Secondary | ICD-10-CM | POA: Diagnosis not present

## 2016-05-02 MED ORDER — MIRTAZAPINE 7.5 MG PO TABS: 7.5000 mg | ORAL_TABLET | Freq: Every day | ORAL | 1 refills | Status: DC

## 2016-05-02 MED ORDER — FLUOXETINE HCL 60 MG PO TABS: 60.0000 mg | ORAL_TABLET | Freq: Every day | ORAL | 1 refills | Status: DC

## 2016-05-02 NOTE — Progress Notes (Signed)
[place] MD/PA/NP OP Progress Note  05/02/2016 11:34 AM [name]  MRN:  [id number]  Chief Complaint:  Chief Complaint    Depression; Follow-up     Subjective:  "I am doing a little better" HPI:  - Per chart review, patient was evaluated by neurologist and was continued on her medication.  Patient presents for follow-up appointment. She states that she is doing a little better and finds Prozac to be helpful. She talks about her husband was sick. She will visit her son in jail. Although she feels depressed at times, it has become less. She complains decreased appetite and had weight loss since last year. She denies insomnia when she takes trazodone. She denies SI. She has panic attacks when she goes to crowd. She was able to taper down clonazepam.   Wt Readings from Last 3 Encounters:  05/02/16 155 lb (70.3 kg)  04/09/16 156 lb (70.8 kg)  04/07/16 155 lb (70.3 kg)    Visit Diagnosis:    ICD-9-CM ICD-10-CM   1. Moderate episode of recurrent major depressive disorder ([place]) 296.32 F33.1     Past Psychiatric History:  Outpatient:  [facility],  Psychiatry admission:  in 2004 for suicide attempt (per note, she was also admitted in 09/04/2010-09/06/2010)  Previous suicide attempt: overdosing flexeril in 2004,  Past trials of medication: fluoxetine, citalopram, Trazodone, lorazepam, clonazepam History of violence: denies  Past Medical History:  Past Medical History:  Diagnosis Date  . Arthritis    "knees" (04/22/2012)  . Bronchitis   . Chronic bronchitis ([name])    "yearly; when the weather changes" (04/22/2012)  . Chronic kidney disease   . Colon polyps    adenomatous and hyperplastic-  . Constipation   . Depression   . Eczema   . Epilepsy ([place])    "been having them right often here lately" (04/22/2012)  . Fatty liver   . SLHTDSKA(768.1)    "1/wk" (04/22/2012)  . High cholesterol   . History of kidney stones   . Osteoarthritis    [name] 04/22/2012  . Other convulsions 05/21/12    non-epileptic spells  . Rectal bleed    in toilet- bright red  . Restless leg   . Seizures ([place])   . Vertigo     Past Surgical History:  Procedure Laterality Date  . ABDOMINAL HYSTERECTOMY  2001  . BLADDER SUSPENSION    . BUNIONECTOMY Left 2000  . CESAREAN SECTION  1987; 1988  . COLONOSCOPY    . JOINT REPLACEMENT    . MASS EXCISION  10/22/2011   Procedure: EXCISION MASS;  Surgeon: [name], MD;  Location: [facility];  Service: General;  Laterality: Right;  excision right buttock mass  . TOTAL HIP ARTHROPLASTY Left 1993; 1995; 2000  . TOTAL KNEE ARTHROPLASTY Left 05/07/2015   Procedure: TOTAL LEFT KNEE ARTHROPLASTY;  Surgeon: [name], MD;  [facility];  Service: Orthopedics;  Laterality: Left;  . TOTAL KNEE ARTHROPLASTY Right 10/01/2015   Procedure: RIGHT TOTAL KNEE ARTHROPLASTY;  Surgeon: [name], MD;  [facility];  Service: Orthopedics;  Laterality: Right;    Family Psychiatric History:  Father- depression, son age 20 - bipolar, schizophrenia,   Family History:  Family History  Problem Relation Age of Onset  . Cancer Mother   . Cancer Brother   . Diabetes Brother   . Cancer Maternal Aunt   . Diabetes Maternal Aunt     Social History:  Social History   Social History  . Marital status:  Married    Spouse name: [name]  . Number of children: 2  . Years of education: 12 +   Occupational History  . unemployed Other   Social History Main Topics  . Smoking status: Never Smoker  . Smokeless tobacco: Never Used  . Alcohol use No     Comment: 12-25-2015 per pt no  . Drug use: No     Comment: 21-12-2015 per pt no   . Sexual activity: Not Currently    Birth control/ protection: Surgical   Other Topics Concern  . None   Social History Narrative   Patient is married [name]) and lives at home with her husband.   Patient has two adult children.   Patient is disabled.   Patient has a high school education and some trade school.   Patient is  right-handed.   Patient drinks four glasses of soda and tea daily.   Lives with her husband of 31 years, and son's family, have two children Work: review record until 2004, currently on disability Education: high school  Allergies:  Allergies  Allergen Reactions  . Acetaminophen Other (See Comments)    Has fatty deposits on liver  . Benadryl [Diphenhydramine Hcl] Other (See Comments)    hyperactivity and seizures  . Codeine Itching and Rash    seizures  . Dilantin [Phenytoin Sodium Extended] Other (See Comments)    Elevated LFT's  . Melatonin Other (See Comments)    seizures  . Ultram [Tramadol Hcl] Other (See Comments)    Seizures  . Vimpat [Lacosamide] Other (See Comments)    Severe dizziness  . Betadine [Povidone Iodine] Rash  . Latex Rash  . Penicillins Itching and Rash    Has patient had a PCN reaction causing immediate rash, facial/tongue/throat swelling, SOB or lightheadedness with hypotension: No Has patient had a PCN reaction causing severe rash involving mucus membranes or skin necrosis: No Has patient had a PCN reaction that required hospitalization No Has patient had a PCN reaction occurring within the last 10 years: No If all of the above answers are "NO", then may proceed with Cephalosporin use.  . Sulfa Antibiotics Rash  . Tape Rash    Paper tape please  . Vicodin [Hydrocodone-Acetaminophen] Itching    Metabolic Disorder Labs: Lab Results  Component Value Date   HGBA1C 5.4 11/28/2015   MPG 108 11/28/2015   No results found for: PROLACTIN Lab Results  Component Value Date   CHOL 217 (H) 04/09/2015   TRIG 156 (H) 04/09/2015   HDL 50 04/09/2015   CHOLHDL 4.3 04/09/2015   LDLCALC 136 (H) 04/09/2015     Current Medications: Current Outpatient Prescriptions  Medication Sig Dispense Refill  . clonazePAM (KLONOPIN) 0.5 MG tablet Take 1 tablet (0.5 mg total) by mouth at bedtime as needed for anxiety. 60 tablet 0  . FLUoxetine HCl 60 MG TABS Take 60 mg  by mouth daily. 30 tablet 1  . gabapentin (NEURONTIN) 600 MG tablet TAKE ONE TABLET BY MOUTH THREE TIMES DAILY 90 tablet 0  . lamoTRIgine (LAMICTAL) 100 MG tablet Take 1 tablet (100 mg total) by mouth 2 (two) times daily. 60 tablet 11  . levETIRAcetam (KEPPRA) 500 MG tablet Take 3 tablets (1,500 mg total) by mouth 2 (two) times daily. 180 tablet 11  . Multiple Vitamin (MULTIVITAMIN WITH MINERALS) TABS tablet Take 1 tablet by mouth daily.    . pramipexole (MIRAPEX) 0.5 MG tablet TAKE ONE TABLET BY MOUTH TWICE DAILY 60 tablet 4  . traZODone (  DESYREL) 50 MG tablet TAKE TWO TO THREE TABLETS BY MOUTH AT BEDTIME AS NEEDED FOR SLEEP 60 tablet 5  . mirtazapine (REMERON) 7.5 MG tablet Take 1 tablet (7.5 mg total) by mouth at bedtime. 30 tablet 1   No current facility-administered medications for this visit.     Neurologic: Headache: No Seizure: No Paresthesias: No  Musculoskeletal: Strength & Muscle Tone: within normal limits Gait & Station: slightly unsteady due to pain Patient leans: N/A  Psychiatric Specialty Exam: Review of Systems  Musculoskeletal: Positive for joint pain.  Neurological: Positive for dizziness.  Psychiatric/Behavioral: Positive for depression. Negative for hallucinations, substance abuse and suicidal ideas. The patient is nervous/anxious. The patient does not have insomnia.   All other systems reviewed and are negative.   Blood pressure 95/70, pulse 75, height 5' (1.524 m), weight 155 lb (70.3 kg), SpO2 97 %.Body mass index is 30.27 kg/m.  General Appearance: Fairly Groomed  Eye Contact:  Good  Speech:  Clear and Coherent  Volume:  Normal  Mood:  "better"  Affect:  Appropriate, Congruent and slightly down  Thought Process:  Coherent and Goal Directed  Orientation:  Full (Time, Place, and Person)  Thought Content: Logical Perceptions: denies AH/VH  Suicidal Thoughts:  No  Homicidal Thoughts:  No  Memory:  Immediate;   Good Recent;   Good Remote;   Good   Judgement:  Good  Insight:  Fair  Psychomotor Activity:  Normal  Concentration:  Concentration: Good and Attention Span: Good  Recall:  Good  Fund of Knowledge: Good  Language: Good  Akathisia:  No  Handed:  Right  AIMS (if indicated):  N/A  Assets:  Communication Skills Desire for Improvement  ADL's:  Intact  Cognition: WNL  Sleep:  fair   Assessment ANIHYA TUMA is a 51 year old female with depression, GAD, pseudoseizure, partial symptomatic epilepsy with complex partial seizures, osteoarthritis, congenital hip dislocation s/p replacement who presents for follow up appointment for depression. Psychosocial stressors including her husband with leukemia, her son in prison and financial strain.   # MDD # Bereavement There is overall improvement in her neurovegetative symptoms since uptitration of fluoxetine. Will add mirtazapine give her residual symptoms and appetite loss (had a weight loss since last year). Discussed side effect of serotonin syndrome and oversedation. She is scheduled to see a therapist.    Will continue current dose. Patient is encouraged to decrease the dose of clonazepam.  Discussed side effects which includes drowsiness, respiratory suppression. She will greatly benefit from CBT/supportive therapy to process for loss; she has started to see a therapist.   # Insomnia She reports improvement in her insomnia since started on Trazodone. Noted that she was evaluated with sleep study and was recommended for pulmonologist with potential benefit from low oxygen; patient is advised to discuss with her PCP.  Plan 1. Continue fluoxetine 60 mg daily 2. Continue clonazepam 0.5 mg at night as needed for anxiety  3. Start mirtazapine 7.5 mg at night 4. Continue Trazodone 50 mg -150 mg at night for sleep 5. Return to clinic in one month (Patient is on Gabapentin, Lamictal, Keppra for seizure, Pramipexole for restless leg)  The patient demonstrates the following risk  factors for suicide: Chronic risk factors for suicide include: psychiatric disorder of depression and previous suicide attempts of overdosing medication. Acute risk factors for suicide include: unemployment and her husband with terminal illness. Protective factors for this patient include: positive social support, coping skills and hope for the  future. Considering these factors, the overall suicide risk at this point appears to be low. Patient is appropriate for outpatient follow up.  Treatment Plan Summary: Plan as above  Norman Clay, MD 05/02/2016, 11:34 AM

## 2016-05-02 NOTE — Patient Instructions (Addendum)
1. Continue fluoxetine 60 mg daily 2. Continue clonazepam 0.5 mg at night as needed for anxiety  3. Start mirtazapine 7.5 mg at night 4. Continue Trazodone 50 mg -150 mg at night for sleep 5. Return to clinic in one month

## 2016-05-05 ENCOUNTER — Ambulatory Visit (HOSPITAL_COMMUNITY): Payer: Self-pay | Admitting: Psychiatry

## 2016-05-06 ENCOUNTER — Encounter (HOSPITAL_COMMUNITY): Payer: Self-pay | Admitting: Psychology

## 2016-05-06 ENCOUNTER — Ambulatory Visit (INDEPENDENT_AMBULATORY_CARE_PROVIDER_SITE_OTHER): Payer: PPO | Admitting: Psychology

## 2016-05-06 DIAGNOSIS — F411 Generalized anxiety disorder: Secondary | ICD-10-CM | POA: Diagnosis not present

## 2016-05-06 DIAGNOSIS — F331 Major depressive disorder, recurrent, moderate: Secondary | ICD-10-CM | POA: Diagnosis not present

## 2016-05-07 NOTE — Progress Notes (Signed)
Comprehensive Clinical Assessment (CCA) Note  05/07/2016 [name] [id number]  Visit Diagnosis:      ICD-9-CM ICD-10-CM   1. Moderate episode of recurrent major depressive disorder (HCC) 296.32 F33.1   2. GAD (generalized anxiety disorder) 300.02 F41.1       CCA Part One  Part One has been completed on paper by the patient.  (See scanned document in Chart Review)  CCA Part Two A  Intake/Chief Complaint:  CCA Intake With Chief Complaint CCA Part Two Date: 05/06/16 CCA Part Two Time: 58 Chief Complaint/Presenting Problem: Pt is tx by Dr. Modesta Messing for MDD and bereavement related to husband's illness and son in prison.  Pt reports she was seeing a counselor in [place], [place]- but wasn't in her network and didn't have the best rapport with.  pt reports she first dealt w/ depression when father died, she began having seizures and son was first in prison.  pt reported that her husband was dx w/ Leukemia in 2010- pt reported that he is dying and that 3 years ago thought he had about 2 years left.  pt reported that doctors can't do anything else for him and he is sick a lot.  currently had pneumonia.  pt also reported that son recently informed that he "got in trouble" in prison and will have 6 more years to serve.  pt also reports that son, his wife and 2 children age 65 and 22 are living w/ them for a year now and this is a stressor.   Patients Currently Reported Symptoms/Problems: pt reports she feels a lot better since starting on Fluoxetine less depressed and anxious.  pt reports that she did have a seizure monday night after learning her son was going to be in prison longer- pt felt this may have been stressed induced.  pt reports that she has been struggling w/ sleep over the past sevearl nights -waking in middle of the night and ruminating on worries.  pt endorses feeling a lot of guilt when talks w/ son- that she should do more for him or blames self for his problems.  pt also at times will  withdrawal into her room for own space.   Collateral Involvement: Dr. Ivor Reining notes Individual's Strengths: enjoys watching tv, enjoys listening to music, enjoys visiting w/ brothers and sister in laws.  pt reports bestfriend is sister in law.  Individual's Preferences: to get back to halfway normal Type of Services Patient Feels Are Needed: counseling and medication  Mental Health Symptoms Depression:  Depression: Change in energy/activity, Difficulty Concentrating, Increase/decrease in appetite, Fatigue, Sleep (too much or little)  Mania:  Mania: N/A  Anxiety:   Anxiety: Difficulty concentrating, Fatigue, Worrying, Sleep  Psychosis:  Psychosis: N/A  Trauma:  Trauma: N/A  Obsessions:  Obsessions: N/A  Compulsions:  Compulsions: N/A  Inattention:  Inattention: N/A  Hyperactivity/Impulsivity:  Hyperactivity/Impulsivity: N/A  Oppositional/Defiant Behaviors:  Oppositional/Defiant Behaviors: N/A  Borderline Personality:  Emotional Irregularity: N/A  Other Mood/Personality Symptoms:      Mental Status Exam Appearance and self-care  Stature:  Stature: Average  Weight:  Weight: Average weight  Clothing:  Clothing: Neat/clean  Grooming:  Grooming: Well-groomed  Cosmetic use:  Cosmetic Use: None  Posture/gait:  Posture/Gait: Normal  Motor activity:  Motor Activity: Not Remarkable  Sensorium  Attention:  Attention: Normal  Concentration:  Concentration: Normal  Orientation:  Orientation: X5  Recall/memory:  Recall/Memory: Normal  Affect and Mood  Affect:  Affect: Appropriate  Mood:  Mood: Anxious  Relating  Eye contact:  Eye Contact: Normal  Facial expression:  Facial Expression: Responsive  Attitude toward examiner:  Attitude Toward Examiner: Cooperative  Thought and Language  Speech flow: Speech Flow: Normal  Thought content:  Thought Content: Appropriate to mood and circumstances  Preoccupation:     Hallucinations:     Organization:     Transport planner of  Knowledge:  Fund of Knowledge: Average  Intelligence:  Intelligence: Average  Abstraction:  Abstraction: Normal  Judgement:  Judgement: Normal  Reality Testing:  Reality Testing: Adequate  Insight:  Insight: Good  Decision Making:  Decision Making: Normal  Social Functioning  Social Maturity:  Social Maturity: Responsible  Social Judgement:  Social Judgement: Normal  Stress  Stressors:  Stressors: Brewing technologist, Transitions (husbands illness and son in prison)  Coping Ability:  Coping Ability: Overwhelmed  Skill Deficits:     Supports:      Family and Psychosocial History: Family history Marital status: Married Number of Years Married: 65 What types of issues is patient dealing with in the relationship?: husband is terminally ill w/ Leukemia Does patient have children?: Yes How many children?: 2 How is patient's relationship with their children?: 2 sons- 69 y/o son and his family lives w/ pt current.  positive relationship.  2 y/o son in prison current- pt reports he has spent half his life in jail.  pt has a lot of guilt re: this.   Childhood History:  Childhood History By whom was/is the patient raised?: Both parents Patient's description of current relationship with people who raised him/her: relationship w/ mom positive.  dad died when she was 35y/o- was difficult for her.  Does patient have siblings?: Yes Number of Siblings: 2 Description of patient's current relationship with siblings: 2 brothers- close to brothers and close to sister in laws. Did patient suffer any verbal/emotional/physical/sexual abuse as a child?: No Did patient suffer from severe childhood neglect?: No Has patient ever been sexually abused/assaulted/raped as an adolescent or adult?: No Was the patient ever a victim of a crime or a disaster?: No Witnessed domestic violence?: No Has patient been effected by domestic violence as an adult?: No  CCA Part Two B  Employment/Work Situation: Employment /  Work Situation Employment situation: On disability Why is patient on disability: seizures How long has patient been on disability: since 2006 What is the longest time patient has a held a job?: 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 Has patient ever been in the [employer]?: No Are There Guns or Other Weapons in [place]?: Yes Are These Weapons Safely Secured?: Yes  Education: Education Did Teacher, adult education From [employer]?: Yes Did You Attend College?: Yes What Type of College Degree Do you Have?: medical billing/records Did You Have An Individualized Education Program (IIEP): No Did You Have Any Difficulty At School?: No  Religion: Religion/Spirituality Are You A Religious Person?: Yes What is Your Religious Affiliation?: Baptist How Might This Affect Treatment?: won't  Leisure/Recreation: Leisure / Recreation Leisure and Hobbies: spending time w/ brothers and sister in laws, listening to music, watching tv  Exercise/Diet: Exercise/Diet Do You Exercise?: No Have You Gained or Lost A Significant Amount of Weight in the Past Six Months?: Yes-Lost Number of Pounds Lost?: 30 Do You Follow a Special Diet?: No Do You Have Any Trouble Sleeping?: Yes Explanation of Sleeping Difficulties: some difficulties when feeling stressed and worrying about family  CCA Part  Two C  Alcohol/Drug Use: Alcohol / Drug Use History of alcohol / drug use?: No history of alcohol / drug abuse                      CCA Part Three  ASAM's:  Six Dimensions of Multidimensional Assessment  Dimension 1:  Acute Intoxication and/or Withdrawal Potential:     Dimension 2:  Biomedical Conditions and Complications:     Dimension 3:  Emotional, Behavioral, or Cognitive Conditions and Complications:     Dimension 4:  Readiness to Change:     Dimension 5:  Relapse, Continued use, or Continued Problem Potential:     Dimension 6:   Recovery/Living Environment:      Substance use Disorder (SUD)    Social Function:  Social Functioning Social Maturity: Responsible Social Judgement: Normal  Stress:  Stress Stressors: Grief/losses, Transitions (husbands illness and son in prison) Coping Ability: Overwhelmed Patient Takes Medications The Way The Doctor Instructed?: Other (Comment) (Pt didn't start new medication as concern of side effect profile. pt reports she will call prescribing provider.) Priority Risk: Low Acuity  Risk Assessment- Self-Harm Potential: Risk Assessment For Self-Harm Potential Thoughts of Self-Harm: No current thoughts Method: No plan  Risk Assessment -Dangerous to Others Potential: Risk Assessment For Dangerous to Others Potential Method: No Plan  DSM5 Diagnoses: Patient Active Problem List   Diagnosis Date Noted  . Moderate episode of recurrent major depressive disorder ([place]) 12/25/2015  . Bereavement 12/25/2015  . HCAP (healthcare-associated pneumonia) 11/28/2015  . Multifocal pneumonia 11/28/2015  . Partial symptomatic epilepsy with complex partial seizures, not intractable, with status epilepticus ([place]) 07/24/2015  . Insomnia w/ sleep apnea 07/24/2015  . OA (osteoarthritis) of knee 05/07/2015  . Memory loss 02/20/2014  . Hypokalemia 06/23/2011  . Moderate major depression ([place]) 10/14/2010    Patient Centered Plan: Patient is on the following Treatment Plan(s):  Anxiety and Depression- see tx plan on file  Recommendations for Services/Supports/Treatments: Recommendations for Services/Supports/Treatments Recommendations For Services/Supports/Treatments: Individual Therapy, Medication Management  Treatment Plan Summary:   Pt to f/u w/ monthly counseling supportive for grief and losses and to address depression and anxiety.  Pt to continue medication management w/ Dr. Modesta Messing and pt agrees to call and inform didn't start Remeron as concerns for seizure and had taken and stopped in  past.   Daxter Paule

## 2016-05-09 ENCOUNTER — Encounter: Payer: Self-pay | Admitting: Physician Assistant

## 2016-05-09 ENCOUNTER — Ambulatory Visit (INDEPENDENT_AMBULATORY_CARE_PROVIDER_SITE_OTHER): Payer: PPO | Admitting: Physician Assistant

## 2016-05-09 VITALS — BP 108/73 | HR 84 | Temp 98.2°F | Ht 60.0 in | Wt 159.0 lb

## 2016-05-09 DIAGNOSIS — J0110 Acute frontal sinusitis, unspecified: Secondary | ICD-10-CM | POA: Diagnosis not present

## 2016-05-09 DIAGNOSIS — H6983 Other specified disorders of Eustachian tube, bilateral: Secondary | ICD-10-CM | POA: Diagnosis not present

## 2016-05-09 MED ORDER — FLUTICASONE PROPIONATE 50 MCG/ACT NA SUSP: 2.0000 | Freq: Every day | NASAL | 6 refills | Status: DC

## 2016-05-09 MED ORDER — LORATADINE 10 MG PO TABS: 10.0000 mg | ORAL_TABLET | Freq: Every day | ORAL | 11 refills | Status: DC

## 2016-05-09 MED ORDER — CEPHALEXIN 500 MG PO CAPS: 500.0000 mg | ORAL_CAPSULE | Freq: Four times a day (QID) | ORAL | 0 refills | Status: DC

## 2016-05-09 NOTE — Patient Instructions (Signed)
Barotitis Media Barotitis media is inflammation of the middle ear. This condition occurs when an auditory tube (eustachian tube) is blocked in one or both ears. These tubes lead from the middle ear to the back of the nose (nasopharynx). This condition typically occurs when you experience changes in pressure, such as when flying or scuba diving. Untreated barotitis media may lead to damage or hearing loss (barotrauma), which may become permanent. What are the causes? This condition may be caused by changes in air pressure from:  Flying.  Scuba diving.  A nearby explosion. What increases the risk? The following factors may make you more likely to develop this condition:  Middle ear infection.  Sinus infection.  A cold.  Environmental allergies.  Small eustachian tubes.  Recent ear surgery. What are the signs or symptoms? Symptoms of this condition may include:  Ear pain.  Hearing loss. In severe cases, symptoms can include:  Dizziness and nausea (vertigo).  Temporary facial paralysis. How is this diagnosed? This condition is diagnosed based on:  A physical exam. Your health care provider may:  Use a device (otoscope) to look into your ear canal and check your eardrum.  Do a test that changes air pressure in the middle ear to check how well the eardrum moves and to see if the eustachian tube is working(tympanogram).  Your medical history. In some cases, your health care provider may have you take a hearing test. You may also be referred to someone who specializes in ear treatment (otolaryngologist, "ENT"). How is this treated? This condition may be treated with:  Medicines to relieve congestion in your nose, sinus, or upper respiratory tract (decongestants).  Techniques to equalize pressure (to "pop" your ears), such as:  Yawning.  Chewing gum.  Swallowing. In severe cases, you may need surgery to relieve your symptoms or to prevent future inflammation. Follow  these instructions at home:  Take over-the-counter and prescription medicines only as told by your health care provider.  Do not put anything into your ears to clean or unplug them. Ear drops will not help.  Keep all follow-up visits as told by your health care provider. This is important. How is this prevented? Using these strategies may help to prevent barotitis media:  Chewing gum with frequent, forceful swallowing during takeoff and landing when flying.  Holding your nose and gently blowing to pop your ears for equalizing pressure changes. This forces air into the eustachian tube.  Yawning during air pressure changes.  Using a nasal decongestant about 30-60 minutes before flying, if you have nasal congestion. Contact a health care provider if:  You have vertigo.  You have hearing loss.  Your symptoms do not get better or they get worse.  You have a fever. Get help right away if:  You have a severe headache, ear pain, and dizziness.  You have balance problems.  You cannot move or feel part of your face.  You have bloody or pus-like drainage from your ears. Summary  Barotitis media is inflammation of the middle ear.  This condition typically occurs when you experience changes in pressure, such as when flying or scuba diving.  You may be at a higher risk for this condition if you have small eustachian tubes, had recent ear surgery, or have allergies, a cold, or sinus or middle ear infection.  This condition may be treated with medicines or techniques to equalize pressure in your ears.  Strategies can be used to help prevent barotitis media. This information is   not intended to replace advice given to you by your health care provider. Make sure you discuss any questions you have with your health care provider. Document Released: 12/28/1999 Document Revised: 11/19/2015 Document Reviewed: 11/19/2015 Elsevier Interactive Patient Education  2017 Elsevier Inc.  

## 2016-05-09 NOTE — Progress Notes (Signed)
BP 108/73   Pulse 84   Temp 98.2 F (36.8 C) (Oral)   Ht 5' (1.524 m)   Wt 159 lb (72.1 kg)   BMI 31.05 kg/m    Subjective:    Patient ID: [name], female    DOB: 07-14-1965, 51 y.o.   MRN: [id number]  HPI: [name] is a 51 y.o. female presenting on 05/09/2016 for Ear Pain (left)  This patient has had many days of sinus headache and postnasal drainage. There is copious drainage at times. Denies any fever at this time. There has been a history of sinus infections in the past.  No history of sinus surgery. There is cough at night. It has become more prevalent in recent days.  Relevant past medical, surgical, family and social history reviewed and updated as indicated. Allergies and medications reviewed and updated.  Past Medical History:  Diagnosis Date  . Arthritis    "knees" (04/22/2012)  . Bronchitis   . Chronic bronchitis ([place])    "yearly; when the weather changes" (04/22/2012)  . Chronic kidney disease    kidney stones  . Colon polyps    adenomatous and hyperplastic-  . Constipation   . Depression   . Eczema   . Epilepsy ([place])    "been having them right often here lately" (04/22/2012)  . Fatty liver   . CWCBJSEG(315.1)    "1/wk" (04/22/2012)  . High cholesterol   . History of kidney stones   . Osteoarthritis    [name] 04/22/2012  . Other convulsions 05/21/12   non-epileptic spells  . Rectal bleed    in toilet- bright red  . Restless leg   . Seizures ([place])   . Vertigo     Past Surgical History:  Procedure Laterality Date  . ABDOMINAL HYSTERECTOMY  2001  . BLADDER SUSPENSION    . BUNIONECTOMY Left 2000  . CESAREAN SECTION  1987; 1988  . COLONOSCOPY    . JOINT REPLACEMENT    . MASS EXCISION  10/22/2011   Procedure: EXCISION MASS;  Surgeon: [name], MD;  Location: [facility];  Service: General;  Laterality: Right;  excision right buttock mass  . TOTAL HIP ARTHROPLASTY Left 1993; 1995; 2000  . TOTAL KNEE ARTHROPLASTY Left 05/07/2015   Procedure: TOTAL LEFT KNEE ARTHROPLASTY;  Surgeon: [name], MD;  Location: [facility];  Service: Orthopedics;  Laterality: Left;  . TOTAL KNEE ARTHROPLASTY Right 10/01/2015   Procedure: RIGHT TOTAL KNEE ARTHROPLASTY;  Surgeon: [name], MD;  Location: [facility];  Service: Orthopedics;  Laterality: Right;    Review of Systems  Constitutional: Positive for chills and fatigue. Negative for activity change and appetite change.  HENT: Positive for congestion, postnasal drip and sore throat.   Eyes: Negative.   Respiratory: Positive for cough and wheezing.   Cardiovascular: Negative.  Negative for chest pain, palpitations and leg swelling.  Gastrointestinal: Negative.   Genitourinary: Negative.   Musculoskeletal: Negative.   Skin: Negative.   Neurological: Positive for headaches.    Allergies as of 05/09/2016      Reactions   Acetaminophen Other (See Comments)   Has fatty deposits on liver   Benadryl [diphenhydramine Hcl] Other (See Comments)   hyperactivity and seizures   Codeine Itching, Rash   seizures   Dilantin [phenytoin Sodium Extended] Other (See Comments)   Elevated LFT's   Melatonin Other (See Comments)   seizures   Ultram [tramadol Hcl] Other (See Comments)   Seizures   Vimpat [lacosamide]  Other (See Comments)   Severe dizziness   Betadine [povidone Iodine] Rash   Latex Rash   Penicillins Itching, Rash   Has patient had a PCN reaction causing immediate rash, facial/tongue/throat swelling, SOB or lightheadedness with hypotension: No Has patient had a PCN reaction causing severe rash involving mucus membranes or skin necrosis: No Has patient had a PCN reaction that required hospitalization No Has patient had a PCN reaction occurring within the last 10 years: No If all of the above answers are "NO", then may proceed with Cephalosporin use.   Sulfa Antibiotics Rash   Tape Rash   Paper tape please   Vicodin [hydrocodone-acetaminophen] Itching      Medication List         Accurate as of 05/09/16  6:44 PM. Always use your most recent med list.          cephALEXin 500 MG capsule Commonly known as:  KEFLEX Take 1 capsule (500 mg total) by mouth 4 (four) times daily.   clonazePAM 0.5 MG tablet Commonly known as:  KLONOPIN Take 1 tablet (0.5 mg total) by mouth at bedtime as needed for anxiety.   FLUoxetine HCl 60 MG Tabs Take 60 mg by mouth daily.   fluticasone 50 MCG/ACT nasal spray Commonly known as:  FLONASE Place 2 sprays into both nostrils daily.   gabapentin 600 MG tablet Commonly known as:  NEURONTIN TAKE ONE TABLET BY MOUTH THREE TIMES DAILY   lamoTRIgine 100 MG tablet Commonly known as:  LAMICTAL Take 1 tablet (100 mg total) by mouth 2 (two) times daily.   levETIRAcetam 500 MG tablet Commonly known as:  KEPPRA Take 3 tablets (1,500 mg total) by mouth 2 (two) times daily.   loratadine 10 MG tablet Commonly known as:  CLARITIN Take 1 tablet (10 mg total) by mouth daily.   mirtazapine 7.5 MG tablet Commonly known as:  REMERON Take 1 tablet (7.5 mg total) by mouth at bedtime.   multivitamin with minerals Tabs tablet Take 1 tablet by mouth daily.   pramipexole 0.5 MG tablet Commonly known as:  MIRAPEX TAKE ONE TABLET BY MOUTH TWICE DAILY   traZODone 50 MG tablet Commonly known as:  DESYREL TAKE TWO TO THREE TABLETS BY MOUTH AT BEDTIME AS NEEDED FOR SLEEP          Objective:    BP 108/73   Pulse 84   Temp 98.2 F (36.8 C) (Oral)   Ht 5' (1.524 m)   Wt 159 lb (72.1 kg)   BMI 31.05 kg/m   Allergies  Allergen Reactions  . Acetaminophen Other (See Comments)    Has fatty deposits on liver  . Benadryl [Diphenhydramine Hcl] Other (See Comments)    hyperactivity and seizures  . Codeine Itching and Rash    seizures  . Dilantin [Phenytoin Sodium Extended] Other (See Comments)    Elevated LFT's  . Melatonin Other (See Comments)    seizures  . Ultram [Tramadol Hcl] Other (See Comments)    Seizures  . Vimpat  [Lacosamide] Other (See Comments)    Severe dizziness  . Betadine [Povidone Iodine] Rash  . Latex Rash  . Penicillins Itching and Rash    Has patient had a PCN reaction causing immediate rash, facial/tongue/throat swelling, SOB or lightheadedness with hypotension: No Has patient had a PCN reaction causing severe rash involving mucus membranes or skin necrosis: No Has patient had a PCN reaction that required hospitalization No Has patient had a PCN reaction occurring within the last  10 years: No If all of the above answers are "NO", then may proceed with Cephalosporin use.  . Sulfa Antibiotics Rash  . Tape Rash    Paper tape please  . Vicodin [Hydrocodone-Acetaminophen] Itching    Physical Exam  Constitutional: She is oriented to person, place, and time. She appears well-developed and well-nourished.  HENT:  Head: Normocephalic and atraumatic.  Right Ear: Tympanic membrane and external ear normal. No middle ear effusion.  Left Ear: Tympanic membrane and external ear normal.  No middle ear effusion.  Nose: Mucosal edema and rhinorrhea present. Right sinus exhibits no maxillary sinus tenderness. Left sinus exhibits no maxillary sinus tenderness.  Mouth/Throat: Uvula is midline. Posterior oropharyngeal erythema present.  Eyes: Conjunctivae and EOM are normal. Pupils are equal, round, and reactive to light. Right eye exhibits no discharge. Left eye exhibits no discharge.  Neck: Normal range of motion.  Cardiovascular: Normal rate, regular rhythm and normal heart sounds.   Pulmonary/Chest: Effort normal and breath sounds normal. No respiratory distress. She has no wheezes.  Abdominal: Soft.  Lymphadenopathy:    She has no cervical adenopathy.  Neurological: She is alert and oriented to person, place, and time.  Skin: Skin is warm and dry.  Psychiatric: She has a normal mood and affect.        Assessment & Plan:   1. Dysfunction of both eustachian tubes - fluticasone (FLONASE) 50  MCG/ACT nasal spray; Place 2 sprays into both nostrils daily.  Dispense: 16 g; Refill: 6 - loratadine (CLARITIN) 10 MG tablet; Take 1 tablet (10 mg total) by mouth daily.  Dispense: 30 tablet; Refill: 11 - cephALEXin (KEFLEX) 500 MG capsule; Take 1 capsule (500 mg total) by mouth 4 (four) times daily.  Dispense: 40 capsule; Refill: 0  2. Acute non-recurrent frontal sinusitis - fluticasone (FLONASE) 50 MCG/ACT nasal spray; Place 2 sprays into both nostrils daily.  Dispense: 16 g; Refill: 6   Current Outpatient Prescriptions:  .  clonazePAM (KLONOPIN) 0.5 MG tablet, Take 1 tablet (0.5 mg total) by mouth at bedtime as needed for anxiety., Disp: 60 tablet, Rfl: 0 .  FLUoxetine HCl 60 MG TABS, Take 60 mg by mouth daily., Disp: 30 tablet, Rfl: 1 .  gabapentin (NEURONTIN) 600 MG tablet, TAKE ONE TABLET BY MOUTH THREE TIMES DAILY, Disp: 90 tablet, Rfl: 0 .  lamoTRIgine (LAMICTAL) 100 MG tablet, Take 1 tablet (100 mg total) by mouth 2 (two) times daily., Disp: 60 tablet, Rfl: 11 .  levETIRAcetam (KEPPRA) 500 MG tablet, Take 3 tablets (1,500 mg total) by mouth 2 (two) times daily., Disp: 180 tablet, Rfl: 11 .  mirtazapine (REMERON) 7.5 MG tablet, Take 1 tablet (7.5 mg total) by mouth at bedtime., Disp: 30 tablet, Rfl: 1 .  Multiple Vitamin (MULTIVITAMIN WITH MINERALS) TABS tablet, Take 1 tablet by mouth daily., Disp: , Rfl:  .  pramipexole (MIRAPEX) 0.5 MG tablet, TAKE ONE TABLET BY MOUTH TWICE DAILY, Disp: 60 tablet, Rfl: 4 .  traZODone (DESYREL) 50 MG tablet, TAKE TWO TO THREE TABLETS BY MOUTH AT BEDTIME AS NEEDED FOR SLEEP, Disp: 60 tablet, Rfl: 5 .  cephALEXin (KEFLEX) 500 MG capsule, Take 1 capsule (500 mg total) by mouth 4 (four) times daily., Disp: 40 capsule, Rfl: 0 .  fluticasone (FLONASE) 50 MCG/ACT nasal spray, Place 2 sprays into both nostrils daily., Disp: 16 g, Rfl: 6 .  loratadine (CLARITIN) 10 MG tablet, Take 1 tablet (10 mg total) by mouth daily., Disp: 30 tablet, Rfl: 11  Continue all  other maintenance medications as listed above.  Follow up plan: Return if symptoms worsen or fail to improve.  Educational handout given for eustachian tube dysfunction  [name] PA-C [facility] [address]  [place], [place] [address] [phone]   05/09/2016, 6:44 PM

## 2016-05-17 ENCOUNTER — Telehealth: Payer: Self-pay | Admitting: Family Medicine

## 2016-05-17 NOTE — Telephone Encounter (Signed)
Appointment made Monday at 9:10.

## 2016-05-17 NOTE — Telephone Encounter (Signed)
What symptoms do you have? Swollen eye and cough  How long have you been sick? A few weeks  Have you been seen for this problem? no  If your provider decides to give you a prescription, which pharmacy would you like for it to be sent to? [facility]   Patient informed that this information will be sent to the clinical staff for review and that they should receive a follow up call.

## 2016-05-18 ENCOUNTER — Other Ambulatory Visit: Payer: Self-pay | Admitting: Family Medicine

## 2016-05-19 ENCOUNTER — Encounter: Payer: Self-pay | Admitting: Physician Assistant

## 2016-05-19 ENCOUNTER — Ambulatory Visit (INDEPENDENT_AMBULATORY_CARE_PROVIDER_SITE_OTHER): Payer: PPO | Admitting: Physician Assistant

## 2016-05-19 VITALS — BP 101/72 | HR 84 | Temp 97.4°F | Ht 60.0 in | Wt 156.2 lb

## 2016-05-19 DIAGNOSIS — F5101 Primary insomnia: Secondary | ICD-10-CM

## 2016-05-19 DIAGNOSIS — H1031 Unspecified acute conjunctivitis, right eye: Secondary | ICD-10-CM

## 2016-05-19 DIAGNOSIS — R059 Cough, unspecified: Secondary | ICD-10-CM

## 2016-05-19 DIAGNOSIS — R05 Cough: Secondary | ICD-10-CM | POA: Diagnosis not present

## 2016-05-19 MED ORDER — ERYTHROMYCIN 5 MG/GM OP OINT: 1.0000 "application " | TOPICAL_OINTMENT | Freq: Four times a day (QID) | OPHTHALMIC | 0 refills | Status: DC

## 2016-05-19 MED ORDER — ALBUTEROL SULFATE (2.5 MG/3ML) 0.083% IN NEBU: 2.5000 mg | INHALATION_SOLUTION | Freq: Four times a day (QID) | RESPIRATORY_TRACT | 1 refills | Status: DC | PRN

## 2016-05-19 MED ORDER — AZITHROMYCIN 250 MG PO TABS: ORAL_TABLET | ORAL | 0 refills | Status: DC

## 2016-05-19 MED ORDER — TRAZODONE HCL 150 MG PO TABS: 150.0000 mg | ORAL_TABLET | Freq: Every day | ORAL | 5 refills | Status: DC

## 2016-05-19 NOTE — Patient Instructions (Signed)
Bacterial Conjunctivitis Bacterial conjunctivitis is an infection of your conjunctiva. This is the clear membrane that covers the white part of your eye and the inner surface of your eyelid. This condition can make your eye:  Red or pink.  Itchy. This condition is caused by bacteria. This condition spreads very easily from person to person (is contagious) and from one eye to the other eye. Follow these instructions at home: Medicines   Take or apply your antibiotic medicine as told by your doctor. Do not stop taking or applying the antibiotic even if you start to feel better.  Take or apply over-the-counter and prescription medicines only as told by your doctor.  Do not touch your eyelid with the eye drop bottle or the ointment tube. Managing discomfort   Wipe any fluid from your eye with a warm, wet washcloth or a cotton ball.  Place a cool, clean washcloth on your eye. Do this for 10-20 minutes, 3-4 times per day. General instructions   Do not wear contact lenses until the irritation is gone. Wear glasses until your doctor says it is okay to wear contacts.  Do not wear eye makeup until your symptoms are gone. Throw away any old makeup.  Change or wash your pillowcase every day.  Do not share towels or washcloths with anyone.  Wash your hands often with soap and water. Use paper towels to dry your hands.  Do not touch or rub your eyes.  Do not drive or use heavy machinery if your vision is blurry. Contact a doctor if:  You have a fever.  Your symptoms do not get better after 10 days. Get help right away if:  You have a fever and your symptoms suddenly get worse.  You have very bad pain when you move your eye.  Your face:  Hurts.  Is red.  Is swollen.  You have sudden loss of vision. This information is not intended to replace advice given to you by your health care provider. Make sure you discuss any questions you have with your health care provider. Document  Released: 10/09/2007 Document Revised: 06/07/2015 Document Reviewed: 10/12/2014 Elsevier Interactive Patient Education  2017 [employer].

## 2016-05-19 NOTE — Progress Notes (Signed)
BP 101/72   Pulse 84   Temp 97.4 F (36.3 C) (Oral)   Ht 5' (1.524 m)   Wt 156 lb 3.2 oz (70.9 kg)   BMI 30.51 kg/m    Subjective:    Patient ID: [name], female    DOB: 31-Jan-1965, 51 y.o.   MRN: [id number]  HPI: [name] is a 51 y.o. female presenting on 05/19/2016 for Cough and right eye matted and red  This patient has had many days of sinus headache and postnasal drainage. There is copious drainage at times. Denies any fever at this time. There has been a history of sinus infections in the past.  No history of sinus surgery. There is cough at night. It has become more prevalent in recent days. Also with right eye redness and matting the past two mornings. Pain full and lots of mucus drainage. Left eye is clear.  Relevant past medical, surgical, family and social history reviewed and updated as indicated. Allergies and medications reviewed and updated.  Past Medical History:  Diagnosis Date  . Arthritis    "knees" (04/22/2012)  . Bronchitis   . Chronic bronchitis ([place])    "yearly; when the weather changes" (04/22/2012)  . Chronic kidney disease    kidney stones  . Colon polyps    adenomatous and hyperplastic-  . Constipation   . Depression   . Eczema   . Epilepsy ([place])    "been having them right often here lately" (04/22/2012)  . Fatty liver   . UQJFHLKT(625.6)    "1/wk" (04/22/2012)  . High cholesterol   . History of kidney stones   . Osteoarthritis    [name] 04/22/2012  . Other convulsions 05/21/12   non-epileptic spells  . Rectal bleed    in toilet- bright red  . Restless leg   . Seizures ([place])   . Vertigo     Past Surgical History:  Procedure Laterality Date  . ABDOMINAL HYSTERECTOMY  2001  . BLADDER SUSPENSION    . BUNIONECTOMY Left 2000  . CESAREAN SECTION  1987; 1988  . COLONOSCOPY    . JOINT REPLACEMENT    . MASS EXCISION  10/22/2011   Procedure: EXCISION MASS;  Surgeon: [name], MD;  Location: [facility];  Service: General;   Laterality: Right;  excision right buttock mass  . TOTAL HIP ARTHROPLASTY Left 1993; 1995; 2000  . TOTAL KNEE ARTHROPLASTY Left 05/07/2015   Procedure: TOTAL LEFT KNEE ARTHROPLASTY;  Surgeon: [name], MD;  Location: [facility];  Service: Orthopedics;  Laterality: Left;  . TOTAL KNEE ARTHROPLASTY Right 10/01/2015   Procedure: RIGHT TOTAL KNEE ARTHROPLASTY;  Surgeon: [name], MD;  Location: [facility];  Service: Orthopedics;  Laterality: Right;    Review of Systems  Constitutional: Positive for chills and fatigue. Negative for activity change, appetite change and fever.  HENT: Positive for congestion, postnasal drip and sore throat.   Eyes: Negative.   Respiratory: Positive for cough. Negative for wheezing.   Cardiovascular: Negative.  Negative for chest pain, palpitations and leg swelling.  Gastrointestinal: Negative.   Genitourinary: Negative.   Musculoskeletal: Negative.   Skin: Negative.   Neurological: Positive for headaches.    Allergies as of 05/19/2016      Reactions   Acetaminophen Other (See Comments)   Has fatty deposits on liver   Benadryl [diphenhydramine Hcl] Other (See Comments)   hyperactivity and seizures   Codeine Itching, Rash   seizures   Dilantin [phenytoin Sodium Extended]  Other (See Comments)   Elevated LFT's   Melatonin Other (See Comments)   seizures   Ultram [tramadol Hcl] Other (See Comments)   Seizures   Vimpat [lacosamide] Other (See Comments)   Severe dizziness   Betadine [povidone Iodine] Rash   Latex Rash   Penicillins Itching, Rash   Has patient had a PCN reaction causing immediate rash, facial/tongue/throat swelling, SOB or lightheadedness with hypotension: No Has patient had a PCN reaction causing severe rash involving mucus membranes or skin necrosis: No Has patient had a PCN reaction that required hospitalization No Has patient had a PCN reaction occurring within the last 10 years: No If all of the above answers are "NO", then may proceed  with Cephalosporin use.   Sulfa Antibiotics Rash   Tape Rash   Paper tape please   Vicodin [hydrocodone-acetaminophen] Itching      Medication List       Accurate as of 05/19/16 10:33 AM. Always use your most recent med list.          albuterol (2.5 MG/3ML) 0.083% nebulizer solution Commonly known as:  PROVENTIL Take 3 mLs (2.5 mg total) by nebulization every 6 (six) hours as needed for wheezing or shortness of breath.   azithromycin 250 MG tablet Commonly known as:  ZITHROMAX Z-PAK As directed   clonazePAM 0.5 MG tablet Commonly known as:  KLONOPIN Take 1 tablet (0.5 mg total) by mouth at bedtime as needed for anxiety.   erythromycin ophthalmic ointment Place 1 application into the right eye 4 (four) times daily.   FLUoxetine HCl 60 MG Tabs Take 60 mg by mouth daily.   fluticasone 50 MCG/ACT nasal spray Commonly known as:  FLONASE Place 2 sprays into both nostrils daily.   gabapentin 600 MG tablet Commonly known as:  NEURONTIN TAKE ONE TABLET BY MOUTH THREE TIMES DAILY   lamoTRIgine 100 MG tablet Commonly known as:  LAMICTAL Take 1 tablet (100 mg total) by mouth 2 (two) times daily.   levETIRAcetam 500 MG tablet Commonly known as:  KEPPRA Take 3 tablets (1,500 mg total) by mouth 2 (two) times daily.   loratadine 10 MG tablet Commonly known as:  CLARITIN Take 1 tablet (10 mg total) by mouth daily.   mirtazapine 7.5 MG tablet Commonly known as:  REMERON Take 1 tablet (7.5 mg total) by mouth at bedtime.   multivitamin with minerals Tabs tablet Take 1 tablet by mouth daily.   pramipexole 0.5 MG tablet Commonly known as:  MIRAPEX TAKE ONE TABLET BY MOUTH TWICE DAILY   traZODone 150 MG tablet Commonly known as:  DESYREL Take 1 tablet (150 mg total) by mouth at bedtime.          Objective:    BP 101/72   Pulse 84   Temp 97.4 F (36.3 C) (Oral)   Ht 5' (1.524 m)   Wt 156 lb 3.2 oz (70.9 kg)   BMI 30.51 kg/m   Allergies  Allergen Reactions  .  Acetaminophen Other (See Comments)    Has fatty deposits on liver  . Benadryl [Diphenhydramine Hcl] Other (See Comments)    hyperactivity and seizures  . Codeine Itching and Rash    seizures  . Dilantin [Phenytoin Sodium Extended] Other (See Comments)    Elevated LFT's  . Melatonin Other (See Comments)    seizures  . Ultram [Tramadol Hcl] Other (See Comments)    Seizures  . Vimpat [Lacosamide] Other (See Comments)    Severe dizziness  . Betadine [Povidone  Iodine] Rash  . Latex Rash  . Penicillins Itching and Rash    Has patient had a PCN reaction causing immediate rash, facial/tongue/throat swelling, SOB or lightheadedness with hypotension: No Has patient had a PCN reaction causing severe rash involving mucus membranes or skin necrosis: No Has patient had a PCN reaction that required hospitalization No Has patient had a PCN reaction occurring within the last 10 years: No If all of the above answers are "NO", then may proceed with Cephalosporin use.  . Sulfa Antibiotics Rash  . Tape Rash    Paper tape please  . Vicodin [Hydrocodone-Acetaminophen] Itching    Physical Exam  Constitutional: She is oriented to person, place, and time. She appears well-developed and well-nourished.  HENT:  Head: Normocephalic and atraumatic.  Right Ear: A middle ear effusion is present.  Left Ear: A middle ear effusion is present.  Nose: Mucosal edema present. Right sinus exhibits no frontal sinus tenderness. Left sinus exhibits no frontal sinus tenderness.  Mouth/Throat: Posterior oropharyngeal erythema present. No oropharyngeal exudate or tonsillar abscesses.  Eyes: EOM are normal. Pupils are equal, round, and reactive to light. Right eye exhibits exudate. Right conjunctiva is injected. Right conjunctiva has no hemorrhage. Left conjunctiva is not injected.  Neck: Normal range of motion.  Cardiovascular: Normal rate, regular rhythm, normal heart sounds and intact distal pulses.   Pulmonary/Chest:  Effort normal and breath sounds normal.  Abdominal: Soft. Bowel sounds are normal.  Neurological: She is alert and oriented to person, place, and time. She has normal reflexes.  Skin: Skin is warm and dry. No rash noted.  Psychiatric: She has a normal mood and affect. Her behavior is normal. Judgment and thought content normal.  Nursing note and vitals reviewed.       Assessment & Plan:   1. Primary insomnia - traZODone (DESYREL) 150 MG tablet; Take 1 tablet (150 mg total) by mouth at bedtime.  Dispense: 30 tablet; Refill: 5  2. Cough - albuterol (PROVENTIL) (2.5 MG/3ML) 0.083% nebulizer solution; Take 3 mLs (2.5 mg total) by nebulization every 6 (six) hours as needed for wheezing or shortness of breath.  Dispense: 150 mL; Refill: 1 - azithromycin (ZITHROMAX Z-PAK) 250 MG tablet; As directed  Dispense: 6 tablet; Refill: 0  3. Acute bacterial conjunctivitis of right eye - erythromycin ophthalmic ointment; Place 1 application into the right eye 4 (four) times daily.  Dispense: 3.5 g; Refill: 0   Continue all other maintenance medications as listed above.  Follow up plan: Return if symptoms worsen or fail to improve.  Educational handout given for conjunctivitis  [name] PA-C [facility] [address]  [place],  [address] [phone]   05/19/2016, 10:33 AM

## 2016-05-21 ENCOUNTER — Telehealth: Payer: Self-pay | Admitting: Physician Assistant

## 2016-05-21 MED ORDER — AZITHROMYCIN 250 MG PO TABS: ORAL_TABLET | ORAL | 0 refills | Status: DC

## 2016-05-21 NOTE — Telephone Encounter (Signed)
What symptoms do you have? Cough and Dizziness from innerear  How long have you been sick? Pt was seen on Monday  Have you been seen for this problem? Seen 05/19/16 with Glenard Haring  If your provider decides to give you a prescription, which pharmacy would you like for it to be sent to? [facility]   Patient informed that this information will be sent to the clinical staff for review and that they should receive a follow up call.

## 2016-05-21 NOTE — Telephone Encounter (Signed)
Please review and advise.

## 2016-05-22 ENCOUNTER — Other Ambulatory Visit: Payer: Self-pay | Admitting: *Deleted

## 2016-05-22 DIAGNOSIS — R05 Cough: Secondary | ICD-10-CM

## 2016-05-22 DIAGNOSIS — R059 Cough, unspecified: Secondary | ICD-10-CM

## 2016-05-22 MED ORDER — ALBUTEROL SULFATE (2.5 MG/3ML) 0.083% IN NEBU: 2.5000 mg | INHALATION_SOLUTION | Freq: Four times a day (QID) | RESPIRATORY_TRACT | 1 refills | Status: DC | PRN

## 2016-05-27 ENCOUNTER — Other Ambulatory Visit: Payer: Self-pay

## 2016-05-27 DIAGNOSIS — R059 Cough, unspecified: Secondary | ICD-10-CM

## 2016-05-27 DIAGNOSIS — R05 Cough: Secondary | ICD-10-CM

## 2016-05-27 MED ORDER — ALBUTEROL SULFATE (2.5 MG/3ML) 0.083% IN NEBU: 2.5000 mg | INHALATION_SOLUTION | Freq: Four times a day (QID) | RESPIRATORY_TRACT | 1 refills | Status: DC | PRN

## 2016-05-28 NOTE — Progress Notes (Signed)
[facility] MD/PA/NP OP Progress Note  06/02/2016 12:01 PM [name]  MRN:  [id number]  Chief Complaint:  Chief Complaint    Follow-up; Depression; Anxiety     Subjective:  "We had a bad news" HPI:  - She has started to see a therapist - She has not started mirtazapine with concern for seizure per chart She reports that she had a "bad news"; when her husband's lung is "shut down." He seems to be gasping in the air at times, and she feels scared whenever he had some event in his medical condition. Her oldest son appears to be stressed as well, and his family had a big argument last night. She endorses low energy and anhedonia, although her anhedonia appears to be getting better. She had a few panic attacks and takes clonazepam as needed. She has decreased appetite and makes herself to eat. She denies SI. She states that she has been taking mirtazapine for about a month without any side effect. Although she reports good relationship with Ms. 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   Wt Readings from Last 3 Encounters:  06/02/16 152 lb 9.6 oz (69.2 kg)  05/19/16 156 lb 3.2 oz (70.9 kg)  05/09/16 159 lb (72.1 kg)    Visit Diagnosis:    ICD-9-CM ICD-10-CM   1. Moderate episode of recurrent major depressive disorder (HCC) 296.32 F33.1   2. Primary insomnia 307.42 F51.01     Past Psychiatric History:  Outpatient:  [facility],  Psychiatry admission:  in 2004 for suicide attempt (per note, she was also admitted in 09/04/2010-09/06/2010)  Previous suicide attempt: overdosing flexeril in 2004,  Past trials of medication: fluoxetine, citalopram, Trazodone, lorazepam, clonazepam History of violence: denies  Past Medical History:  Past Medical History:  Diagnosis Date  . Arthritis    "knees" (04/22/2012)  . Bronchitis   . Chronic bronchitis ([place])    "yearly; when the weather changes" (04/22/2012)  . Chronic kidney disease    kidney stones  . Colon polyps    adenomatous and  hyperplastic-  . Constipation   . Depression   . Eczema   . Epilepsy ([place])    "been having them right often here lately" (04/22/2012)  . Fatty liver   . YCXKGYJE(563.1)    "1/wk" (04/22/2012)  . High cholesterol   . History of kidney stones   . Osteoarthritis    [name] 04/22/2012  . Other convulsions 05/21/12   non-epileptic spells  . Rectal bleed    in toilet- bright red  . Restless leg   . Seizures ([place])   . Vertigo     Past Surgical History:  Procedure Laterality Date  . ABDOMINAL HYSTERECTOMY  2001  . BLADDER SUSPENSION    . BUNIONECTOMY Left 2000  . CESAREAN SECTION  1987; 1988  . COLONOSCOPY    . JOINT REPLACEMENT    . MASS EXCISION  10/22/2011   Procedure: EXCISION MASS;  Surgeon: [name], MD;  Location: [facility];  Service: General;  Laterality: Right;  excision right buttock mass  . TOTAL HIP ARTHROPLASTY Left 1993; 1995; 2000  . TOTAL KNEE ARTHROPLASTY Left 05/07/2015   Procedure: TOTAL LEFT KNEE ARTHROPLASTY;  Surgeon: [name], MD;  Location[facility];  Service: Orthopedics;  Laterality: Left;  . TOTAL KNEE ARTHROPLASTY Right 10/01/2015   Procedure: RIGHT TOTAL KNEE ARTHROPLASTY;  Surgeon: [name], MD;  Location: [facility];  Service: Orthopedics;  Laterality: Right;    Family Psychiatric History:  Father- depression, son age 56 - bipolar, schizophrenia,   Family History:  Family History  Problem Relation Age of Onset  . Cancer Mother   . Depression Father   . Cancer Brother   . Diabetes Brother   . Cancer Maternal Aunt   . Diabetes Maternal Aunt   . Bipolar disorder Son   . Drug abuse Son     Social History:  Social History   Social History  . Marital status: Married    Spouse name: [name]  . Number of children: 2  . Years of education: 12 +   Occupational History  . unemployed Other   Social History Main Topics  . Smoking status: Never Smoker  . Smokeless tobacco: Never Used  . Alcohol use No     Comment: 12-25-2015 per pt no  .  Drug use: No     Comment: 21-12-2015 per pt no   . Sexual activity: Not Currently    Birth control/ protection: Surgical   Other Topics Concern  . Not on file   Social History Narrative   Patient is married [name]) and lives at home with her husband.   Patient has two adult children.   Patient is disabled.   Patient has a high school education and some trade school.   Patient is right-handed.   Patient drinks four glasses of soda and tea daily.   Lives with her husband of 31 years, and son's family, have two children Work: review record until 2004, currently on disability Education: high school  Allergies:  Allergies  Allergen Reactions  . Acetaminophen Other (See Comments)    Has fatty deposits on liver  . Benadryl [Diphenhydramine Hcl] Other (See Comments)    hyperactivity and seizures  . Codeine Itching and Rash    seizures  . Dilantin [Phenytoin Sodium Extended] Other (See Comments)    Elevated LFT's  . Melatonin Other (See Comments)    seizures  . Ultram [Tramadol Hcl] Other (See Comments)    Seizures  . Vimpat [Lacosamide] Other (See Comments)    Severe dizziness  . Betadine [Povidone Iodine] Rash  . Latex Rash  . Penicillins Itching and Rash    Has patient had a PCN reaction causing immediate rash, facial/tongue/throat swelling, SOB or lightheadedness with hypotension: No Has patient had a PCN reaction causing severe rash involving mucus membranes or skin necrosis: No Has patient had a PCN reaction that required hospitalization No Has patient had a PCN reaction occurring within the last 10 years: No If all of the above answers are "NO", then may proceed with Cephalosporin use.  . Sulfa Antibiotics Rash  . Tape Rash    Paper tape please  . Vicodin [Hydrocodone-Acetaminophen] Itching    Metabolic Disorder Labs: Lab Results  Component Value Date   HGBA1C 5.4 11/28/2015   MPG 108 11/28/2015   No results found for: PROLACTIN Lab Results  Component Value  Date   CHOL 217 (H) 04/09/2015   TRIG 156 (H) 04/09/2015   HDL 50 04/09/2015   CHOLHDL 4.3 04/09/2015   LDLCALC 136 (H) 04/09/2015     Current Medications: Current Outpatient Prescriptions  Medication Sig Dispense Refill  . albuterol (PROVENTIL) (2.5 MG/3ML) 0.083% nebulizer solution Take 3 mLs (2.5 mg total) by nebulization every 6 (six) hours as needed for wheezing or shortness of breath. Dx R05 150 mL 1  . azithromycin (ZITHROMAX Z-PAK) 250 MG tablet As directed 6 tablet 0  . azithromycin (ZITHROMAX Z-PAK) 250 MG tablet Take  as directed 6 each 0  . clonazePAM (KLONOPIN) 0.5 MG tablet Take 1 tablet (0.5 mg total) by mouth at bedtime as needed for anxiety. 60 tablet 0  . erythromycin ophthalmic ointment Place 1 application into the right eye 4 (four) times daily. 3.5 g 0  . FLUoxetine HCl 60 MG TABS Take 60 mg by mouth daily. 30 tablet 1  . fluticasone (FLONASE) 50 MCG/ACT nasal spray Place 2 sprays into both nostrils daily. 16 g 6  . gabapentin (NEURONTIN) 600 MG tablet TAKE 1 TABLET BY MOUTH THREE TIMES DAILY 90 tablet 2  . lamoTRIgine (LAMICTAL) 100 MG tablet Take 1 tablet (100 mg total) by mouth 2 (two) times daily. 60 tablet 11  . levETIRAcetam (KEPPRA) 500 MG tablet Take 3 tablets (1,500 mg total) by mouth 2 (two) times daily. 180 tablet 11  . loratadine (CLARITIN) 10 MG tablet Take 1 tablet (10 mg total) by mouth daily. 30 tablet 11  . mirtazapine (REMERON) 15 MG tablet Take 1 tablet (15 mg total) by mouth at bedtime. 30 tablet 1  . Multiple Vitamin (MULTIVITAMIN WITH MINERALS) TABS tablet Take 1 tablet by mouth daily.    . pramipexole (MIRAPEX) 0.5 MG tablet TAKE ONE TABLET BY MOUTH TWICE DAILY 60 tablet 4  . traZODone (DESYREL) 150 MG tablet Take 1 tablet (150 mg total) by mouth at bedtime. 30 tablet 5   No current facility-administered medications for this visit.     Neurologic: Headache: No Seizure: No Paresthesias: No  Musculoskeletal: Strength & Muscle Tone: within  normal limits Gait & Station: slightly unsteady due to pain Patient leans: N/A  Psychiatric Specialty Exam: Review of Systems  Musculoskeletal: Positive for joint pain.  Neurological: Positive for dizziness.  Psychiatric/Behavioral: Positive for depression. Negative for hallucinations, substance abuse and suicidal ideas. The patient is nervous/anxious and has insomnia.   All other systems reviewed and are negative.   Blood pressure 112/74, pulse 65, height 4' 11.84" (1.52 m), weight 152 lb 9.6 oz (69.2 kg).Body mass index is 29.96 kg/m.  General Appearance: Fairly Groomed  Eye Contact:  Good  Speech:  Clear and Coherent  Volume:  Normal  Mood:  "not good"  Affect:  Appropriate, Congruent and slightly down  Thought Process:  Coherent and Goal Directed  Orientation:  Full (Time, Place, and Person)  Thought Content: Logical Perceptions: denies AH/VH  Suicidal Thoughts:  No  Homicidal Thoughts:  No  Memory:  Immediate;   Good Recent;   Good Remote;   Good  Judgement:  Good  Insight:  Fair  Psychomotor Activity:  Normal  Concentration:  Concentration: Good and Attention Span: Good  Recall:  Good  Fund of Knowledge: Good  Language: Good  Akathisia:  No  Handed:  Right  AIMS (if indicated):  N/A  Assets:  Communication Skills Desire for Improvement  ADL's:  Intact  Cognition: WNL  Sleep:  fair   Assessment BRITTINIE WHERLEY is a 51 year old female with depression, GAD, pseudoseizure, partial symptomatic epilepsy with complex partial seizures, osteoarthritis, congenital hip dislocation s/p replacement who presents for follow up appointment for depression. Psychosocial stressors including her husband with leukemia, her son in prison and financial strain.   # MDD # Bereavement Although she has responded very well to uptitration of fluoxetine, she endorses worsening in her neurovegetative symptoms in the setting of her husband's deteriorating medical health. Will uptitrate  mirtazapine to target her depression, insomnia and appetite loss. Will continue clonazepam prn for anxiety and trazodone  prn for insomnia. Validated her grief; she will see her therapist next month.  Plan 1. Continue fluoxetine 60 mg daily 2. Continue clonazepam 0.5 mg at night as needed for anxiety  3. Increase mirtazapine 15 mg at night 4. Continue Trazodone 150 mg at night for sleep 5. Return to clinic in one month (Patient is on Gabapentin, Lamictal, Keppra for seizure, Pramipexole for restless leg)  The patient demonstrates the following risk factors for suicide: Chronic risk factors for suicide include: psychiatric disorder of depression and previous suicide attempts of overdosing medication. Acute risk factors for suicide include: unemployment and her husband with terminal illness. Protective factors for this patient include: positive social support, coping skills and hope for the future. Considering these factors, the overall suicide risk at this point appears to be low. Patient is appropriate for outpatient follow up.  Treatment Plan Summary: Plan as above  Norman Clay, MD 06/02/2016, 12:01 PM

## 2016-06-02 ENCOUNTER — Ambulatory Visit (INDEPENDENT_AMBULATORY_CARE_PROVIDER_SITE_OTHER): Payer: PPO | Admitting: Psychiatry

## 2016-06-02 VITALS — BP 112/74 | HR 65 | Ht 59.84 in | Wt 152.6 lb

## 2016-06-02 DIAGNOSIS — Z882 Allergy status to sulfonamides status: Secondary | ICD-10-CM

## 2016-06-02 DIAGNOSIS — G40209 Localization-related (focal) (partial) symptomatic epilepsy and epileptic syndromes with complex partial seizures, not intractable, without status epilepticus: Secondary | ICD-10-CM | POA: Diagnosis not present

## 2016-06-02 DIAGNOSIS — F5101 Primary insomnia: Secondary | ICD-10-CM

## 2016-06-02 DIAGNOSIS — F329 Major depressive disorder, single episode, unspecified: Secondary | ICD-10-CM

## 2016-06-02 DIAGNOSIS — F411 Generalized anxiety disorder: Secondary | ICD-10-CM | POA: Diagnosis not present

## 2016-06-02 DIAGNOSIS — Z813 Family history of other psychoactive substance abuse and dependence: Secondary | ICD-10-CM

## 2016-06-02 DIAGNOSIS — Z79899 Other long term (current) drug therapy: Secondary | ICD-10-CM

## 2016-06-02 DIAGNOSIS — Z888 Allergy status to other drugs, medicaments and biological substances status: Secondary | ICD-10-CM

## 2016-06-02 DIAGNOSIS — Z9109 Other allergy status, other than to drugs and biological substances: Secondary | ICD-10-CM

## 2016-06-02 DIAGNOSIS — F331 Major depressive disorder, recurrent, moderate: Secondary | ICD-10-CM

## 2016-06-02 DIAGNOSIS — Z880 Allergy status to penicillin: Secondary | ICD-10-CM

## 2016-06-02 DIAGNOSIS — Z818 Family history of other mental and behavioral disorders: Secondary | ICD-10-CM | POA: Diagnosis not present

## 2016-06-02 DIAGNOSIS — Z886 Allergy status to analgesic agent status: Secondary | ICD-10-CM | POA: Diagnosis not present

## 2016-06-02 DIAGNOSIS — Z915 Personal history of self-harm: Secondary | ICD-10-CM

## 2016-06-02 DIAGNOSIS — M1990 Unspecified osteoarthritis, unspecified site: Secondary | ICD-10-CM

## 2016-06-02 MED ORDER — CLONAZEPAM 0.5 MG PO TABS
0.5000 mg | ORAL_TABLET | Freq: Every evening | ORAL | 0 refills | Status: DC | PRN
Start: 2016-06-02 — End: 2016-07-02

## 2016-06-02 MED ORDER — MIRTAZAPINE 15 MG PO TABS: 15.0000 mg | ORAL_TABLET | Freq: Every day | ORAL | 1 refills | Status: DC

## 2016-06-02 NOTE — Patient Instructions (Addendum)
1. Continue fluoxetine 60 mg daily 2. Continue clonazepam 0.5 mg at night as needed for anxiety  3. Start mirtazapine 15 mg at night 4. Continue Trazodone 150 mg at night for sleep 5. Return to clinic in one month

## 2016-06-05 ENCOUNTER — Ambulatory Visit (HOSPITAL_COMMUNITY): Payer: Self-pay | Admitting: Psychology

## 2016-06-26 NOTE — Progress Notes (Signed)
[facility] MD/PA/NP OP Progress Note  07/02/2016 12:36 PM [name]  MRN:  [id number]  Chief Complaint:  Chief Complaint    Follow-up; Depression     Subjective:  "I'm doing fine" HPI:  Patient presents for follow up appointment. She states that she is doing relatively well. She talks about issues at house; her brother is separating after 22 years of marriage. She also has another brother who is planning to have their dog dead due to disease. Her son in prison calls her 17 times in a day asking for money. She states that her husband had a fever and needed to visit his doctor a couple of times. She feels that she is relatively handling well with these stress. She has occasional fatigue and anhedonia. She denies insomnia and finds mirtazapine to be helpful. She denies SI. She feels anxious and had occasional panic attacks. She denies AH/VH.   Wt Readings from Last 3 Encounters:  07/02/16 153 lb 12.8 oz (69.8 kg)  06/02/16 152 lb 9.6 oz (69.2 kg)  05/19/16 156 lb 3.2 oz (70.9 kg)    Visit Diagnosis:    ICD-10-CM   1. Moderate episode of recurrent major depressive disorder ([place]) F33.1     Past Psychiatric History:  Outpatient:  [facility],  Psychiatry admission:  in 2004 for suicide attempt (per note, she was also admitted in 09/04/2010-09/06/2010)  Previous suicide attempt: overdosing flexeril in 2004,  Past trials of medication: fluoxetine, citalopram, Trazodone, lorazepam, clonazepam History of violence: denies  Past Medical History:  Past Medical History:  Diagnosis Date  . Arthritis    "knees" (04/22/2012)  . Bronchitis   . Chronic bronchitis ([place])    "yearly; when the weather changes" (04/22/2012)  . Chronic kidney disease    kidney stones  . Colon polyps    adenomatous and hyperplastic-  . Constipation   . Depression   . Eczema   . Epilepsy ([place])    "been having them right often here lately" (04/22/2012)  . Fatty liver   . VOHYWVPX(106.2)    "1/wk" (04/22/2012)  . High  cholesterol   . History of kidney stones   . Osteoarthritis    [name] 04/22/2012  . Other convulsions 05/21/12   non-epileptic spells  . Rectal bleed    in toilet- bright red  . Restless leg   . Seizures ([place])   . Vertigo     Past Surgical History:  Procedure Laterality Date  . ABDOMINAL HYSTERECTOMY  2001  . BLADDER SUSPENSION    . BUNIONECTOMY Left 2000  . CESAREAN SECTION  1987; 1988  . COLONOSCOPY    . JOINT REPLACEMENT    . MASS EXCISION  10/22/2011   Procedure: EXCISION MASS;  Surgeon: [name], MD;  Location: [facility];  Service: General;  Laterality: Right;  excision right buttock mass  . TOTAL HIP ARTHROPLASTY Left 1993; 1995; 2000  . TOTAL KNEE ARTHROPLASTY Left 05/07/2015   Procedure: TOTAL LEFT KNEE ARTHROPLASTY;  Surgeon: [name], MD;  Location: [facility];  Service: Orthopedics;  Laterality: Left;  . TOTAL KNEE ARTHROPLASTY Right 10/01/2015   Procedure: RIGHT TOTAL KNEE ARTHROPLASTY;  Surgeon: [name], MD;  Location: [facility];  Service: Orthopedics;  Laterality: Right;    Family Psychiatric History:  Father- depression, son age 31 - bipolar, schizophrenia,   Family History:  Family History  Problem Relation Age of Onset  . Cancer Mother   . Depression Father   . Cancer Brother   . Diabetes  Brother   . Cancer Maternal Aunt   . Diabetes Maternal Aunt   . Bipolar disorder Son   . Drug abuse Son     Social History:  Social History   Social History  . Marital status: Married    Spouse name: [name]  . Number of children: 2  . Years of education: 12 +   Occupational History  . unemployed Other   Social History Main Topics  . Smoking status: Never Smoker  . Smokeless tobacco: Never Used  . Alcohol use No     Comment: 12-25-2015 per pt no  . Drug use: No     Comment: 21-12-2015 per pt no   . Sexual activity: Not Currently    Birth control/ protection: Surgical   Other Topics Concern  . None   Social History Narrative   Patient is  married [name]) and lives at home with her husband.   Patient has two adult children.   Patient is disabled.   Patient has a high school education and some trade school.   Patient is right-handed.   Patient drinks four glasses of soda and tea daily.   Lives with her husband of 31 years, and son's family, have two children Work: review record until 2004, currently on disability Education: high school  Allergies:  Allergies  Allergen Reactions  . Acetaminophen Other (See Comments)    Has fatty deposits on liver  . Benadryl [Diphenhydramine Hcl] Other (See Comments)    hyperactivity and seizures  . Codeine Itching and Rash    seizures  . Dilantin [Phenytoin Sodium Extended] Other (See Comments)    Elevated LFT's  . Melatonin Other (See Comments)    seizures  . Ultram [Tramadol Hcl] Other (See Comments)    Seizures  . Vimpat [Lacosamide] Other (See Comments)    Severe dizziness  . Betadine [Povidone Iodine] Rash  . Latex Rash  . Penicillins Itching and Rash    Has patient had a PCN reaction causing immediate rash, facial/tongue/throat swelling, SOB or lightheadedness with hypotension: No Has patient had a PCN reaction causing severe rash involving mucus membranes or skin necrosis: No Has patient had a PCN reaction that required hospitalization No Has patient had a PCN reaction occurring within the last 10 years: No If all of the above answers are "NO", then may proceed with Cephalosporin use.  . Sulfa Antibiotics Rash  . Tape Rash    Paper tape please  . Vicodin [Hydrocodone-Acetaminophen] Itching    Metabolic Disorder Labs: Lab Results  Component Value Date   HGBA1C 5.4 11/28/2015   MPG 108 11/28/2015   No results found for: PROLACTIN Lab Results  Component Value Date   CHOL 217 (H) 04/09/2015   TRIG 156 (H) 04/09/2015   HDL 50 04/09/2015   CHOLHDL 4.3 04/09/2015   LDLCALC 136 (H) 04/09/2015     Current Medications: Current Outpatient Prescriptions   Medication Sig Dispense Refill  . albuterol (PROVENTIL) (2.5 MG/3ML) 0.083% nebulizer solution Take 3 mLs (2.5 mg total) by nebulization every 6 (six) hours as needed for wheezing or shortness of breath. Dx R05 150 mL 1  . azithromycin (ZITHROMAX Z-PAK) 250 MG tablet As directed 6 tablet 0  . azithromycin (ZITHROMAX Z-PAK) 250 MG tablet Take as directed 6 each 0  . clonazePAM (KLONOPIN) 0.5 MG tablet Take 1 tablet (0.5 mg total) by mouth 2 (two) times daily as needed for anxiety. 60 tablet 0  . erythromycin ophthalmic ointment Place 1 application into the  right eye 4 (four) times daily. 3.5 g 0  . FLUoxetine HCl 60 MG TABS Take 60 mg by mouth daily. 30 tablet 1  . fluticasone (FLONASE) 50 MCG/ACT nasal spray Place 2 sprays into both nostrils daily. 16 g 6  . gabapentin (NEURONTIN) 600 MG tablet TAKE 1 TABLET BY MOUTH THREE TIMES DAILY 90 tablet 2  . lamoTRIgine (LAMICTAL) 100 MG tablet Take 1 tablet (100 mg total) by mouth 2 (two) times daily. 60 tablet 11  . levETIRAcetam (KEPPRA) 500 MG tablet Take 3 tablets (1,500 mg total) by mouth 2 (two) times daily. 180 tablet 11  . loratadine (CLARITIN) 10 MG tablet Take 1 tablet (10 mg total) by mouth daily. 30 tablet 11  . mirtazapine (REMERON) 30 MG tablet Take 1 tablet (30 mg total) by mouth at bedtime. 30 tablet 1  . Multiple Vitamin (MULTIVITAMIN WITH MINERALS) TABS tablet Take 1 tablet by mouth daily.    . pramipexole (MIRAPEX) 0.5 MG tablet TAKE ONE TABLET BY MOUTH TWICE DAILY 60 tablet 4  . traZODone (DESYREL) 150 MG tablet Take 1 tablet (150 mg total) by mouth at bedtime. 30 tablet 5   No current facility-administered medications for this visit.     Neurologic: Headache: No Seizure: No- has a history of seizure Paresthesias: No  Musculoskeletal: Strength & Muscle Tone: within normal limits Gait & Station: slightly unsteady due to pain Patient leans: N/A  Psychiatric Specialty Exam: Review of Systems  Musculoskeletal: Positive for  joint pain.  Neurological: Positive for dizziness.  Psychiatric/Behavioral: Positive for depression. Negative for hallucinations, substance abuse and suicidal ideas. The patient is nervous/anxious and has insomnia.   All other systems reviewed and are negative.   Blood pressure 98/72, pulse 72, height 4' 11.84" (1.52 m), weight 153 lb 12.8 oz (69.8 kg), SpO2 91 %.Body mass index is 30.2 kg/m.  General Appearance: Fairly Groomed  Eye Contact:  Good  Speech:  Clear and Coherent  Volume:  Normal  Mood:  "better"  Affect:  Appropriate, Congruent and slightly down- improving  Thought Process:  Coherent and Goal Directed  Orientation:  Full (Time, Place, and Person)  Thought Content: Logical Perceptions: denies AH/VH  Suicidal Thoughts:  No  Homicidal Thoughts:  No  Memory:  Immediate;   Good Recent;   Good Remote;   Good  Judgement:  Good  Insight:  Fair  Psychomotor Activity:  Normal  Concentration:  Concentration: Good and Attention Span: Good  Recall:  Good  Fund of Knowledge: Good  Language: Good  Akathisia:  No  Handed:  Right  AIMS (if indicated):  N/A  Assets:  Communication Skills Desire for Improvement  ADL's:  Intact  Cognition: WNL  Sleep:  good   Assessment LAURISSA COWPER is a 51 year old female with depression, GAD, pseudoseizure, partial symptomatic epilepsy with complex partial seizures, osteoarthritis, congenital hip dislocation s/p replacement who presents for follow up appointment for depression. Psychosocial stressors including her husband with leukemia, her son in prison and financial strain.   # MDD # Bereavement There is some improvement in her neurovegetative symptoms since uptitration of mirtazapine, although she has residual symptoms in the setting of psychosocial stressors as described above. Will uptitrate it further to optimize its effect to target her depression and anxiety. Will continue fluoxetine for depression. Will continue clonazepam prn for  anxiety and trazodone prn for insomnia.   Plan 1. Continue fluoxetine 60 mg daily 2. Continue clonazepam 0.5 mg twice as needed for anxiety  3. Increase mirtazapine 30 mg at night 4. Continue Trazodone 150 mg at night for sleep 5. Return to clinic in one month for 30 mins (Patient is on Gabapentin, Lamictal, Keppra for seizure, Pramipexole for restless leg)  The patient demonstrates the following risk factors for suicide: Chronic risk factors for suicide include: psychiatric disorder of depression and previous suicide attempts of overdosing medication. Acute risk factors for suicide include: unemployment and her husband with terminal illness. Protective factors for this patient include: positive social support, coping skills and hope for the future. Considering these factors, the overall suicide risk at this point appears to be low. Patient is appropriate for outpatient follow up.  Treatment Plan Summary: Plan as above  Norman Clay, MD 07/02/2016, 12:36 PM

## 2016-07-02 ENCOUNTER — Encounter (HOSPITAL_COMMUNITY): Payer: Self-pay | Admitting: Psychiatry

## 2016-07-02 ENCOUNTER — Ambulatory Visit (INDEPENDENT_AMBULATORY_CARE_PROVIDER_SITE_OTHER): Payer: PPO | Admitting: Psychiatry

## 2016-07-02 VITALS — BP 98/72 | HR 72 | Ht 59.84 in | Wt 153.8 lb

## 2016-07-02 DIAGNOSIS — F411 Generalized anxiety disorder: Secondary | ICD-10-CM | POA: Diagnosis not present

## 2016-07-02 DIAGNOSIS — G4089 Other seizures: Secondary | ICD-10-CM

## 2016-07-02 DIAGNOSIS — F331 Major depressive disorder, recurrent, moderate: Secondary | ICD-10-CM | POA: Diagnosis not present

## 2016-07-02 DIAGNOSIS — G4700 Insomnia, unspecified: Secondary | ICD-10-CM

## 2016-07-02 DIAGNOSIS — Z818 Family history of other mental and behavioral disorders: Secondary | ICD-10-CM

## 2016-07-02 DIAGNOSIS — Z634 Disappearance and death of family member: Secondary | ICD-10-CM | POA: Diagnosis not present

## 2016-07-02 DIAGNOSIS — Z813 Family history of other psychoactive substance abuse and dependence: Secondary | ICD-10-CM | POA: Diagnosis not present

## 2016-07-02 DIAGNOSIS — M1990 Unspecified osteoarthritis, unspecified site: Secondary | ICD-10-CM | POA: Diagnosis not present

## 2016-07-02 DIAGNOSIS — G40802 Other epilepsy, not intractable, without status epilepticus: Secondary | ICD-10-CM

## 2016-07-02 MED ORDER — CLONAZEPAM 0.5 MG PO TABS: 0.5000 mg | ORAL_TABLET | Freq: Two times a day (BID) | ORAL | 0 refills | Status: DC | PRN

## 2016-07-02 MED ORDER — FLUOXETINE HCL 60 MG PO TABS: 60.0000 mg | ORAL_TABLET | Freq: Every day | ORAL | 1 refills | Status: DC

## 2016-07-02 MED ORDER — MIRTAZAPINE 30 MG PO TABS: 30.0000 mg | ORAL_TABLET | Freq: Every day | ORAL | 1 refills | Status: DC

## 2016-07-02 NOTE — Patient Instructions (Signed)
1. Continue fluoxetine 60 mg daily 2. Continue clonazepam 0.5 mg twice as needed for anxiety  3. Increase mirtazapine 30 mg at night 4. Continue Trazodone 150 mg at night for sleep 5. Return to clinic in one month for 30 mins

## 2016-07-04 DIAGNOSIS — H2513 Age-related nuclear cataract, bilateral: Secondary | ICD-10-CM | POA: Diagnosis not present

## 2016-07-04 DIAGNOSIS — H40033 Anatomical narrow angle, bilateral: Secondary | ICD-10-CM | POA: Diagnosis not present

## 2016-07-07 ENCOUNTER — Encounter: Payer: Self-pay | Admitting: Physician Assistant

## 2016-07-07 ENCOUNTER — Ambulatory Visit (INDEPENDENT_AMBULATORY_CARE_PROVIDER_SITE_OTHER): Payer: PPO | Admitting: Physician Assistant

## 2016-07-07 VITALS — BP 99/75 | HR 79 | Temp 97.0°F | Ht 60.0 in | Wt 158.4 lb

## 2016-07-07 DIAGNOSIS — Z0000 Encounter for general adult medical examination without abnormal findings: Secondary | ICD-10-CM

## 2016-07-07 DIAGNOSIS — Z01419 Encounter for gynecological examination (general) (routine) without abnormal findings: Secondary | ICD-10-CM

## 2016-07-07 NOTE — Patient Instructions (Signed)
In a few days you may receive a survey in the mail or online from [employer] regarding your visit with us today. Please take a moment to fill this out. Your feedback is very important to our whole office. It can help us better understand your needs as well as improve your experience and satisfaction. Thank you for taking your time to complete it. We care about you.  Liban Guedes, PA-C  

## 2016-07-07 NOTE — Progress Notes (Signed)
BP 99/75   Pulse 79   Temp 97 F (36.1 C) (Oral)   Ht 5' (1.524 m)   Wt 158 lb 6.4 oz (71.8 kg)   BMI 30.94 kg/m    Subjective:    Patient ID: [name], female    DOB: 04-05-1965, 51 y.o.   MRN: [id number]  HPI: MORENIKE CUFF is a 51 y.o. female presenting on 07/07/2016 for Annual Exam  This patient comes in for annual well physical examination. All medications are reviewed today. There are no reports of any problems with the medications. All of the medical conditions are reviewed and updated.  Lab work is reviewed and will be ordered as medically necessary. There are no new problems reported with today's visit.  Patient reports doing well overall. Patient had hysterectomy around age 57 due to a cyst. After this Pap is completed we will no longer need to collect the Pap. She will still need a annual exam for a manual and breast exam  Relevant past medical, surgical, family and social history reviewed and updated as indicated. Allergies and medications reviewed and updated.  Past Medical History:  Diagnosis Date  . Arthritis    "knees" (04/22/2012)  . Bronchitis   . Chronic bronchitis ([place])    "yearly; when the weather changes" (04/22/2012)  . Chronic kidney disease    kidney stones  . Colon polyps    adenomatous and hyperplastic-  . Constipation   . Depression   . Eczema   . Epilepsy ([place])    "been having them right often here lately" (04/22/2012)  . Fatty liver   . FHQRFXJO(832.5)    "1/wk" (04/22/2012)  . High cholesterol   . History of kidney stones   . Osteoarthritis    [name] 04/22/2012  . Other convulsions 05/21/12   non-epileptic spells  . Rectal bleed    in toilet- bright red  . Restless leg   . Seizures ([place])   . Vertigo     Past Surgical History:  Procedure Laterality Date  . ABDOMINAL HYSTERECTOMY  2001  . BLADDER SUSPENSION    . BUNIONECTOMY Left 2000  . CESAREAN SECTION  1987; 1988  . COLONOSCOPY    . JOINT REPLACEMENT    . MASS EXCISION   10/22/2011   Procedure: EXCISION MASS;  Surgeon: [name], MD;  Location: [facility];  Service: General;  Laterality: Right;  excision right buttock mass  . TOTAL HIP ARTHROPLASTY Left 1993; 1995; 2000  . TOTAL KNEE ARTHROPLASTY Left 05/07/2015   Procedure: TOTAL LEFT KNEE ARTHROPLASTY;  Surgeon: [name], MD;  [facility];  Service: Orthopedics;  Laterality: Left;  . TOTAL KNEE ARTHROPLASTY Right 10/01/2015   Procedure: RIGHT TOTAL KNEE ARTHROPLASTY;  Surgeon: [name], MD;  [facility];  Service: Orthopedics;  Laterality: Right;    Review of Systems  Constitutional: Negative.  Negative for activity change, fatigue and fever.  HENT: Negative.   Eyes: Negative.   Respiratory: Positive for cough. Negative for shortness of breath and wheezing.   Cardiovascular: Negative.  Negative for chest pain.  Gastrointestinal: Negative.  Negative for abdominal pain.  Endocrine: Negative.   Genitourinary: Negative.  Negative for dysuria.  Musculoskeletal: Negative.   Skin: Negative.   Neurological: Negative.     Allergies as of 07/07/2016      Reactions   Acetaminophen Other (See Comments)   Has fatty deposits on liver   Benadryl [diphenhydramine Hcl] Other (See Comments)   hyperactivity and  seizures   Codeine Itching, Rash   seizures   Dilantin [phenytoin Sodium Extended] Other (See Comments)   Elevated LFT's   Melatonin Other (See Comments)   seizures   Ultram [tramadol Hcl] Other (See Comments)   Seizures   Vimpat [lacosamide] Other (See Comments)   Severe dizziness   Betadine [povidone Iodine] Rash   Latex Rash   Penicillins Itching, Rash   Has patient had a PCN reaction causing immediate rash, facial/tongue/throat swelling, SOB or lightheadedness with hypotension: No Has patient had a PCN reaction causing severe rash involving mucus membranes or skin necrosis: No Has patient had a PCN reaction that required hospitalization No Has patient had a PCN reaction  occurring within the last 10 years: No If all of the above answers are "NO", then may proceed with Cephalosporin use.   Sulfa Antibiotics Rash   Tape Rash   Paper tape please   Vicodin [hydrocodone-acetaminophen] Itching      Medication List       Accurate as of 07/07/16 12:35 PM. Always use your most recent med list.          albuterol (2.5 MG/3ML) 0.083% nebulizer solution Commonly known as:  PROVENTIL Take 3 mLs (2.5 mg total) by nebulization every 6 (six) hours as needed for wheezing or shortness of breath. Dx R05   clonazePAM 0.5 MG tablet Commonly known as:  KLONOPIN Take 1 tablet (0.5 mg total) by mouth 2 (two) times daily as needed for anxiety.   FLUoxetine HCl 60 MG Tabs Take 60 mg by mouth daily.   fluticasone 50 MCG/ACT nasal spray Commonly known as:  FLONASE Place 2 sprays into both nostrils daily.   gabapentin 600 MG tablet Commonly known as:  NEURONTIN TAKE 1 TABLET BY MOUTH THREE TIMES DAILY   lamoTRIgine 100 MG tablet Commonly known as:  LAMICTAL Take 1 tablet (100 mg total) by mouth 2 (two) times daily.   levETIRAcetam 500 MG tablet Commonly known as:  KEPPRA Take 3 tablets (1,500 mg total) by mouth 2 (two) times daily.   loratadine 10 MG tablet Commonly known as:  CLARITIN Take 1 tablet (10 mg total) by mouth daily.   multivitamin with minerals Tabs tablet Take 1 tablet by mouth daily.   pramipexole 0.5 MG tablet Commonly known as:  MIRAPEX TAKE ONE TABLET BY MOUTH TWICE DAILY   traZODone 150 MG tablet Commonly known as:  DESYREL Take 1 tablet (150 mg total) by mouth at bedtime.          Objective:    BP 99/75   Pulse 79   Temp 97 F (36.1 C) (Oral)   Ht 5' (1.524 m)   Wt 158 lb 6.4 oz (71.8 kg)   BMI 30.94 kg/m   Allergies  Allergen Reactions  . Acetaminophen Other (See Comments)    Has fatty deposits on liver  . Benadryl [Diphenhydramine Hcl] Other (See Comments)    hyperactivity and seizures  . Codeine Itching and Rash      seizures  . Dilantin [Phenytoin Sodium Extended] Other (See Comments)    Elevated LFT's  . Melatonin Other (See Comments)    seizures  . Ultram [Tramadol Hcl] Other (See Comments)    Seizures  . Vimpat [Lacosamide] Other (See Comments)    Severe dizziness  . Betadine [Povidone Iodine] Rash  . Latex Rash  . Penicillins Itching and Rash    Has patient had a PCN reaction causing immediate rash, facial/tongue/throat swelling, SOB or lightheadedness with hypotension:  No Has patient had a PCN reaction causing severe rash involving mucus membranes or skin necrosis: No Has patient had a PCN reaction that required hospitalization No Has patient had a PCN reaction occurring within the last 10 years: No If all of the above answers are "NO", then may proceed with Cephalosporin use.  . Sulfa Antibiotics Rash  . Tape Rash    Paper tape please  . Vicodin [Hydrocodone-Acetaminophen] Itching    Physical Exam  Constitutional: She is oriented to person, place, and time. She appears well-developed and well-nourished.  HENT:  Head: Normocephalic and atraumatic.  Eyes: Conjunctivae and EOM are normal. Pupils are equal, round, and reactive to light.  Neck: Normal range of motion. Neck supple.  Cardiovascular: Normal rate, regular rhythm, normal heart sounds and intact distal pulses.   Pulmonary/Chest: Effort normal and breath sounds normal. Right breast exhibits no mass, no skin change and no tenderness. Left breast exhibits no mass, no skin change and no tenderness. Breasts are symmetrical.  Abdominal: Soft. Bowel sounds are normal.  Genitourinary: Vagina normal and uterus normal. Rectal exam shows no fissure. No breast swelling, tenderness, discharge or bleeding. There is no tenderness or lesion on the right labia. There is no tenderness or lesion on the left labia. Uterus is not deviated, not enlarged and not tender. Cervix exhibits no motion tenderness, no discharge and no friability. Right adnexum  displays no mass, no tenderness and no fullness. Left adnexum displays no mass, no tenderness and no fullness. No tenderness or bleeding in the vagina. No vaginal discharge found.  Neurological: She is alert and oriented to person, place, and time. She has normal reflexes.  Skin: Skin is warm and dry. No rash noted.  Psychiatric: She has a normal mood and affect. Her behavior is normal. Judgment and thought content normal.  Nursing note and vitals reviewed.       Assessment & Plan:   1. Well female exam with routine gynecological exam - CBC with Differential/Platelet - CMP14+EGFR - Lipid panel - TSH - Pap IG (Image Guided)   Current Outpatient Prescriptions:  .  albuterol (PROVENTIL) (2.5 MG/3ML) 0.083% nebulizer solution, Take 3 mLs (2.5 mg total) by nebulization every 6 (six) hours as needed for wheezing or shortness of breath. Dx R05, Disp: 150 mL, Rfl: 1 .  clonazePAM (KLONOPIN) 0.5 MG tablet, Take 1 tablet (0.5 mg total) by mouth 2 (two) times daily as needed for anxiety., Disp: 60 tablet, Rfl: 0 .  FLUoxetine HCl 60 MG TABS, Take 60 mg by mouth daily., Disp: 30 tablet, Rfl: 1 .  gabapentin (NEURONTIN) 600 MG tablet, TAKE 1 TABLET BY MOUTH THREE TIMES DAILY, Disp: 90 tablet, Rfl: 2 .  lamoTRIgine (LAMICTAL) 100 MG tablet, Take 1 tablet (100 mg total) by mouth 2 (two) times daily., Disp: 60 tablet, Rfl: 11 .  levETIRAcetam (KEPPRA) 500 MG tablet, Take 3 tablets (1,500 mg total) by mouth 2 (two) times daily., Disp: 180 tablet, Rfl: 11 .  loratadine (CLARITIN) 10 MG tablet, Take 1 tablet (10 mg total) by mouth daily., Disp: 30 tablet, Rfl: 11 .  Multiple Vitamin (MULTIVITAMIN WITH MINERALS) TABS tablet, Take 1 tablet by mouth daily., Disp: , Rfl:  .  pramipexole (MIRAPEX) 0.5 MG tablet, TAKE ONE TABLET BY MOUTH TWICE DAILY, Disp: 60 tablet, Rfl: 4 .  traZODone (DESYREL) 150 MG tablet, Take 1 tablet (150 mg total) by mouth at bedtime., Disp: 30 tablet, Rfl: 5 .  fluticasone (FLONASE)  50 MCG/ACT nasal spray, Place  2 sprays into both nostrils daily. (Patient not taking: Reported on 07/07/2016), Disp: 16 g, Rfl: 6  Continue all other maintenance medications as listed above.  Follow up plan: Recheck as needed  Educational handout given for health maintenance  [name] PA-C [facility] [address]  [place], [place] [address] [phone]   07/07/2016, 12:35 PM

## 2016-07-08 LAB — CMP14+EGFR
ALT: 25 IU/L (ref 0–32)
AST: 25 IU/L (ref 0–40)
Albumin/Globulin Ratio: 2 (ref 1.2–2.2)
Albumin: 4.3 g/dL (ref 3.5–5.5)
Alkaline Phosphatase: 65 IU/L (ref 39–117)
BUN/Creatinine Ratio: 15 (ref 9–23)
BUN: 11 mg/dL (ref 6–24)
Bilirubin Total: 0.3 mg/dL (ref 0.0–1.2)
CO2: 25 mmol/L (ref 20–29)
Calcium: 9.1 mg/dL (ref 8.7–10.2)
Chloride: 106 mmol/L (ref 96–106)
Creatinine, Ser: 0.73 mg/dL (ref 0.57–1.00)
GFR calc Af Amer: 111 mL/min/{1.73_m2} (ref >59)
GFR calc non Af Amer: 96 mL/min/{1.73_m2} (ref >59)
Globulin, Total: 2.1 g/dL (ref 1.5–4.5)
Glucose: 83 mg/dL (ref 65–99)
Potassium: 4.5 mmol/L (ref 3.5–5.2)
Sodium: 142 mmol/L (ref 134–144)
Total Protein: 6.4 g/dL (ref 6.0–8.5)

## 2016-07-08 LAB — CBC WITH DIFFERENTIAL/PLATELET
Basophils Absolute: 0 10*3/uL (ref 0.0–0.2)
Basos: 1 %
EOS (ABSOLUTE): 0.1 10*3/uL (ref 0.0–0.4)
Eos: 1 %
Hematocrit: 36 % (ref 34.0–46.6)
Hemoglobin: 11.6 g/dL (ref 11.1–15.9)
Immature Grans (Abs): 0 10*3/uL (ref 0.0–0.1)
Immature Granulocytes: 0 %
Lymphocytes Absolute: 2 10*3/uL (ref 0.7–3.1)
Lymphs: 30 %
MCH: 29 pg (ref 26.6–33.0)
MCHC: 32.2 g/dL (ref 31.5–35.7)
MCV: 90 fL (ref 79–97)
Monocytes Absolute: 0.7 10*3/uL (ref 0.1–0.9)
Monocytes: 10 %
Neutrophils Absolute: 3.8 10*3/uL (ref 1.4–7.0)
Neutrophils: 58 %
Platelets: 234 10*3/uL (ref 150–379)
RBC: 4 x10E6/uL (ref 3.77–5.28)
RDW: 14.3 % (ref 12.3–15.4)
WBC: 6.6 10*3/uL (ref 3.4–10.8)

## 2016-07-08 LAB — PAP IG (IMAGE GUIDED): PAP Smear Comment: 0

## 2016-07-08 LAB — LIPID PANEL
Chol/HDL Ratio: 3.1 ratio (ref 0.0–4.4)
Cholesterol, Total: 164 mg/dL (ref 100–199)
HDL: 53 mg/dL (ref >39)
LDL Calculated: 86 mg/dL (ref 0–99)
Triglycerides: 124 mg/dL (ref 0–149)
VLDL Cholesterol Cal: 25 mg/dL (ref 5–40)

## 2016-07-08 LAB — TSH: TSH: 1.9 u[IU]/mL (ref 0.450–4.500)

## 2016-07-10 ENCOUNTER — Ambulatory Visit (INDEPENDENT_AMBULATORY_CARE_PROVIDER_SITE_OTHER): Payer: PPO | Admitting: Psychology

## 2016-07-10 DIAGNOSIS — F331 Major depressive disorder, recurrent, moderate: Secondary | ICD-10-CM

## 2016-07-10 DIAGNOSIS — F329 Major depressive disorder, single episode, unspecified: Secondary | ICD-10-CM | POA: Diagnosis not present

## 2016-07-10 NOTE — Progress Notes (Signed)
   THERAPIST PROGRESS NOTE  Session Time: 10.10am-11am  Participation Level: Active  Behavioral Response: Well GroomedAlertaffect bright  Type of Therapy: Individual Therapy  Treatment Goals addressed: Diagnosis: MDD and goal 1.  Interventions: CBT and Supportive  Summary: [name] is a 51 y.o. female who presents with full and bright affect.  Pt reported that her mood has improved- not as withdrawn, not as depressed and tearful and sleep improved.  Pt reported that she has had another seizure recent when brother's arguing.  Pt reported this was very stressful as both had guns and her mom ended up calling police.  Pt reported that there has been a lot of conflict among siblings recent which is not usual but seems to be settling down.  Pt discussed her husband's health and grieving.  Pt also discussed how son in prison has been calling asking for money and that this morning she told him no.  Pt felt good about asserting - but also that he belittled her.  Pt reframed and was able to identify that not responsible for him- his actions.     Suicidal/Homicidal: Nowithout intent/plan  Therapist Response: Assessed pt current functioning per pt report.  Processed w/pt recent stressors and how coping.  Validated and normalized grieving.  Discussed her asserting and setting boundaries w/ son- expecting his negative reactions and being able to cope w/ through supports and reframing. Also reiterated she can hang up and assert that she is doing so if verbally abusive.   Plan: Return again in 4 weeks.  Diagnosis:  MDD    Jan Fireman, [facility] 07/10/2016

## 2016-07-17 ENCOUNTER — Telehealth: Payer: Self-pay | Admitting: Neurology

## 2016-07-17 NOTE — Telephone Encounter (Signed)
Form for [employer] filled out, signed and sent to [facility].

## 2016-07-23 NOTE — Progress Notes (Deleted)
[place] MD/PA/NP OP Progress Note  07/23/2016 9:51 AM LAURANA MAGISTRO  MRN:  [id number]  Chief Complaint:  Subjective:  *** HPI: *** Visit Diagnosis: No diagnosis found.  Past Psychiatric History:   I have reviewed the patient's psychiatry history in detail and updated the patient record. Outpatient: [facility],  Psychiatry admission: in 2004 for suicide attempt (per note, she was also admitted in 09/04/2010-09/06/2010)  Previous suicide attempt: overdosing flexeril in 2004,  Past trials of medication: fluoxetine, citalopram, Trazodone, lorazepam, clonazepam History of violence: denies   Past Medical History:  Past Medical History:  Diagnosis Date  . Arthritis    "knees" (04/22/2012)  . Bronchitis   . Chronic bronchitis ([place])    "yearly; when the weather changes" (04/22/2012)  . Chronic kidney disease    kidney stones  . Colon polyps    adenomatous and hyperplastic-  . Constipation   . Depression   . Eczema   . Epilepsy ([place])    "been having them right often here lately" (04/22/2012)  . Fatty liver   . SPQZRAQT(622.6)    "1/wk" (04/22/2012)  . High cholesterol   . History of kidney stones   . Osteoarthritis    [name] 04/22/2012  . Other convulsions 05/21/12   non-epileptic spells  . Rectal bleed    in toilet- bright red  . Restless leg   . Seizures ([place])   . Vertigo     Past Surgical History:  Procedure Laterality Date  . ABDOMINAL HYSTERECTOMY  2001  . BLADDER SUSPENSION    . BUNIONECTOMY Left 2000  . CESAREAN SECTION  1987; 1988  . COLONOSCOPY    . JOINT REPLACEMENT    . MASS EXCISION  10/22/2011   Procedure: EXCISION MASS;  Surgeon: [name], MD;  Location: [place];  Service: General;  Laterality: Right;  excision right buttock mass  . TOTAL HIP ARTHROPLASTY Left 1993; 1995; 2000  . TOTAL KNEE ARTHROPLASTY Left 05/07/2015   Procedure: TOTAL LEFT KNEE ARTHROPLASTY;  Surgeon: [name], MD;  Location[facility];  Service: Orthopedics;  Laterality: Left;  .  TOTAL KNEE ARTHROPLASTY Right 10/01/2015   Procedure: RIGHT TOTAL KNEE ARTHROPLASTY;  Surgeon: [name], MD;  Location: [facility];  Service: Orthopedics;  Laterality: Right;    Family Psychiatric History:   I have reviewed the patient's family history in detail and updated the patient record. Father- depression, son age 52 - bipolar, schizophrenia,    Family History:  Family History  Problem Relation Age of Onset  . Cancer Mother   . Depression Father   . Cancer Brother   . Diabetes Brother   . Cancer Maternal Aunt   . Diabetes Maternal Aunt   . Bipolar disorder Son   . Drug abuse Son     Social History:  Social History   Social History  . Marital status: Married    Spouse name: [name]  . Number of children: 2  . Years of education: 12 +   Occupational History  . unemployed Other   Social History Main Topics  . Smoking status: Never Smoker  . Smokeless tobacco: Never Used  . Alcohol use No     Comment: 12-25-2015 per pt no  . Drug use: No     Comment: 21-12-2015 per pt no   . Sexual activity: Not Currently    Birth control/ protection: Surgical   Other Topics Concern  . Not on file   Social History Narrative   Patient is married [name])  and lives at home with her husband.   Patient has two adult children.   Patient is disabled.   Patient has a high school education and some trade school.   Patient is right-handed.   Patient drinks four glasses of soda and tea daily.    Allergies:  Allergies  Allergen Reactions  . Acetaminophen Other (See Comments)    Has fatty deposits on liver  . Benadryl [Diphenhydramine Hcl] Other (See Comments)    hyperactivity and seizures  . Codeine Itching and Rash    seizures  . Dilantin [Phenytoin Sodium Extended] Other (See Comments)    Elevated LFT's  . Melatonin Other (See Comments)    seizures  . Ultram [Tramadol Hcl] Other (See Comments)    Seizures  . Vimpat [Lacosamide] Other (See Comments)    Severe dizziness   . Betadine [Povidone Iodine] Rash  . Latex Rash  . Penicillins Itching and Rash    Has patient had a PCN reaction causing immediate rash, facial/tongue/throat swelling, SOB or lightheadedness with hypotension: No Has patient had a PCN reaction causing severe rash involving mucus membranes or skin necrosis: No Has patient had a PCN reaction that required hospitalization No Has patient had a PCN reaction occurring within the last 10 years: No If all of the above answers are "NO", then may proceed with Cephalosporin use.  . Sulfa Antibiotics Rash  . Tape Rash    Paper tape please  . Vicodin [Hydrocodone-Acetaminophen] Itching    Metabolic Disorder Labs: Lab Results  Component Value Date   HGBA1C 5.4 11/28/2015   MPG 108 11/28/2015   No results found for: PROLACTIN Lab Results  Component Value Date   CHOL 164 07/07/2016   TRIG 124 07/07/2016   HDL 53 07/07/2016   CHOLHDL 3.1 07/07/2016   LDLCALC 86 07/07/2016   LDLCALC 136 (H) 04/09/2015     Current Medications: Current Outpatient Prescriptions  Medication Sig Dispense Refill  . albuterol (PROVENTIL) (2.5 MG/3ML) 0.083% nebulizer solution Take 3 mLs (2.5 mg total) by nebulization every 6 (six) hours as needed for wheezing or shortness of breath. Dx R05 150 mL 1  . clonazePAM (KLONOPIN) 0.5 MG tablet Take 1 tablet (0.5 mg total) by mouth 2 (two) times daily as needed for anxiety. 60 tablet 0  . FLUoxetine HCl 60 MG TABS Take 60 mg by mouth daily. 30 tablet 1  . fluticasone (FLONASE) 50 MCG/ACT nasal spray Place 2 sprays into both nostrils daily. (Patient not taking: Reported on 07/07/2016) 16 g 6  . gabapentin (NEURONTIN) 600 MG tablet TAKE 1 TABLET BY MOUTH THREE TIMES DAILY 90 tablet 2  . lamoTRIgine (LAMICTAL) 100 MG tablet Take 1 tablet (100 mg total) by mouth 2 (two) times daily. 60 tablet 11  . levETIRAcetam (KEPPRA) 500 MG tablet Take 3 tablets (1,500 mg total) by mouth 2 (two) times daily. 180 tablet 11  . loratadine  (CLARITIN) 10 MG tablet Take 1 tablet (10 mg total) by mouth daily. 30 tablet 11  . Multiple Vitamin (MULTIVITAMIN WITH MINERALS) TABS tablet Take 1 tablet by mouth daily.    . pramipexole (MIRAPEX) 0.5 MG tablet TAKE ONE TABLET BY MOUTH TWICE DAILY 60 tablet 4  . traZODone (DESYREL) 150 MG tablet Take 1 tablet (150 mg total) by mouth at bedtime. 30 tablet 5   No current facility-administered medications for this visit.     Neurologic: Headache: No Seizure: No Paresthesias: No  Musculoskeletal: Strength & Muscle Tone: within normal limits Gait & Station:  normal Patient leans: N/A  Psychiatric Specialty Exam: ROS  There were no vitals taken for this visit.There is no height or weight on file to calculate BMI.  General Appearance: Fairly Groomed  Eye Contact:  Good  Speech:  Clear and Coherent  Volume:  Normal  Mood:  {BHH MOOD:22306}  Affect:  {Affect (PAA):22687}  Thought Process:  Coherent and Goal Directed  Orientation:  Full (Time, Place, and Person)  Thought Content: Logical   Suicidal Thoughts:  {ST/HT (PAA):22692}  Homicidal Thoughts:  {ST/HT (PAA):22692}  Memory:  Immediate;   Good Recent;   Good Remote;   Good  Judgement:  {Judgement (PAA):22694}  Insight:  {Insight (PAA):22695}  Psychomotor Activity:  Normal  Concentration:  Concentration: Good and Attention Span: Good  Recall:  Good  Fund of Knowledge: Good  Language: Good  Akathisia:  No  Handed:  Ambidextrous  AIMS (if indicated):  N/A  Assets:  Communication Skills Desire for Improvement  ADL's:  Intact  Cognition: WNL  Sleep:  ***   Assessment CYBILL URIEGAS is a 51 year old female with depression, GAD, pseudoseizure, partial symptomatic epilepsy with complex partial seizures, osteoarthritis, congenital hip dislocation s/p replacement. Patient presents for follow up appointment for No diagnosis found. Psychosocial stressors including her husband with leukemia, her son in prison and financial strain.    # MDD, moderate, recurrent without psychotic features # Bereavement  There is some improvement in her neurovegetative symptoms since uptitration of mirtazapine, although she has residual symptoms in the setting of psychosocial stressors as described above. Will uptitrate it further to optimize its effect to target her depression and anxiety. Will continue fluoxetine for depression. Will continue clonazepam prn for anxiety and trazodone prn for insomnia.   Plan 1. Continue fluoxetine 60 mg daily 2. Continue clonazepam 0.5 mg twice as needed for anxiety  3. Increase mirtazapine 30 mg at night 4. Continue Trazodone 150 mg at night for sleep 5. Return to clinic in one month for 30 mins (Patient is on Gabapentin, Lamictal, Keppra for seizure, Pramipexole for restless leg)  The patient demonstrates the following risk factors for suicide: Chronic risk factors for suicide include: psychiatric disorder of depressionand previous suicide attempts of overdosing medication. Acute risk factorsfor suicide include: unemployment and her husband with terminal illness. Protective factorsfor this patient include: positive social support, coping skills and hope for the future. Considering these factors, the overall suicide risk at this point appears to be low. Patient isappropriate for outpatient follow up.  Treatment Plan Summary:Plan as above   Norman Clay, MD 07/23/2016, 9:51 AM

## 2016-07-24 DIAGNOSIS — Z0289 Encounter for other administrative examinations: Secondary | ICD-10-CM

## 2016-07-28 ENCOUNTER — Ambulatory Visit (HOSPITAL_COMMUNITY): Payer: Self-pay | Admitting: Psychiatry
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
## 2016-07-29 ENCOUNTER — Ambulatory Visit (INDEPENDENT_AMBULATORY_CARE_PROVIDER_SITE_OTHER): Payer: PPO | Admitting: Psychiatry

## 2016-07-29 ENCOUNTER — Encounter (HOSPITAL_COMMUNITY): Payer: Self-pay | Admitting: Psychiatry

## 2016-07-29 VITALS — BP 105/77 | HR 82 | Ht 59.0 in | Wt 158.8 lb

## 2016-07-29 DIAGNOSIS — Z634 Disappearance and death of family member: Secondary | ICD-10-CM

## 2016-07-29 DIAGNOSIS — Z9889 Other specified postprocedural states: Secondary | ICD-10-CM

## 2016-07-29 DIAGNOSIS — G40802 Other epilepsy, not intractable, without status epilepticus: Secondary | ICD-10-CM | POA: Diagnosis not present

## 2016-07-29 DIAGNOSIS — F411 Generalized anxiety disorder: Secondary | ICD-10-CM

## 2016-07-29 DIAGNOSIS — G4089 Other seizures: Secondary | ICD-10-CM

## 2016-07-29 DIAGNOSIS — M1990 Unspecified osteoarthritis, unspecified site: Secondary | ICD-10-CM | POA: Diagnosis not present

## 2016-07-29 DIAGNOSIS — Z813 Family history of other psychoactive substance abuse and dependence: Secondary | ICD-10-CM

## 2016-07-29 DIAGNOSIS — F331 Major depressive disorder, recurrent, moderate: Secondary | ICD-10-CM | POA: Diagnosis not present

## 2016-07-29 DIAGNOSIS — G4700 Insomnia, unspecified: Secondary | ICD-10-CM

## 2016-07-29 DIAGNOSIS — Z818 Family history of other mental and behavioral disorders: Secondary | ICD-10-CM

## 2016-07-29 MED ORDER — CLONAZEPAM 0.5 MG PO TABS: 0.5000 mg | ORAL_TABLET | Freq: Every day | ORAL | 0 refills | Status: DC | PRN

## 2016-07-29 MED ORDER — FLUOXETINE HCL 60 MG PO TABS
60.0000 mg | ORAL_TABLET | Freq: Every day | ORAL | 1 refills | Status: DC
Start: 2016-07-29 — End: 2016-09-23

## 2016-07-29 MED ORDER — MIRTAZAPINE 30 MG PO TABS: 30.0000 mg | ORAL_TABLET | Freq: Every day | ORAL | 1 refills | Status: DC

## 2016-07-29 NOTE — Patient Instructions (Signed)
1. Continue Fluoxetine 60 mg daily 2. Continue mirtazapine 30 mg at night 3. Continue clonazepam 0.5 mg daily as needed for anxiety 4. Continue Trazodone 150 mg at night for sleep 5. Return to clinic in one month for 30 mins

## 2016-08-14 DIAGNOSIS — M79674 Pain in right toe(s): Secondary | ICD-10-CM | POA: Diagnosis not present

## 2016-08-14 DIAGNOSIS — L03031 Cellulitis of right toe: Secondary | ICD-10-CM | POA: Diagnosis not present

## 2016-08-16 ENCOUNTER — Other Ambulatory Visit: Payer: Self-pay | Admitting: Physician Assistant

## 2016-08-18 ENCOUNTER — Ambulatory Visit (INDEPENDENT_AMBULATORY_CARE_PROVIDER_SITE_OTHER): Payer: PPO | Admitting: Psychology

## 2016-08-18 DIAGNOSIS — F331 Major depressive disorder, recurrent, moderate: Secondary | ICD-10-CM
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
## 2016-08-27 ENCOUNTER — Other Ambulatory Visit: Payer: Self-pay | Admitting: Family Medicine

## 2016-08-27 ENCOUNTER — Other Ambulatory Visit: Payer: Self-pay | Admitting: Neurology

## 2016-08-28 ENCOUNTER — Telehealth: Payer: Self-pay | Admitting: *Deleted

## 2016-08-28 NOTE — Telephone Encounter (Signed)
Spoke to pt and asked  What pharmacy she is using, she said [facility],  Is not using [facility], who requested refill on pts generic keppra.

## 2016-09-05 ENCOUNTER — Encounter: Payer: Self-pay | Admitting: Physician Assistant

## 2016-09-05 ENCOUNTER — Ambulatory Visit (INDEPENDENT_AMBULATORY_CARE_PROVIDER_SITE_OTHER): Payer: PPO | Admitting: Physician Assistant

## 2016-09-05 VITALS — BP 111/74 | HR 88 | Temp 98.0°F | Ht 60.0 in | Wt 158.0 lb

## 2016-09-05 DIAGNOSIS — G8929 Other chronic pain: Secondary | ICD-10-CM | POA: Diagnosis not present

## 2016-09-05 DIAGNOSIS — M25511 Pain in right shoulder: Secondary | ICD-10-CM | POA: Diagnosis not present

## 2016-09-05 MED ORDER — CYCLOBENZAPRINE HCL 10 MG PO TABS: 10.0000 mg | ORAL_TABLET | Freq: Three times a day (TID) | ORAL | 0 refills | Status: DC | PRN

## 2016-09-05 MED ORDER — PREDNISONE 10 MG (48) PO TBPK: ORAL_TABLET | ORAL | 0 refills | Status: DC

## 2016-09-05 NOTE — Progress Notes (Signed)
BP 111/74   Pulse 88   Temp 98 F (36.7 C) (Oral)   Ht 5' (1.524 m)   Wt 158 lb (71.7 kg)   BMI 30.86 kg/m    Subjective:    Patient ID: [name], female    DOB: 04/02/1965, 51 y.o.   MRN: [id number]  HPI: [name] is a 51 y.o. female presenting on 09/05/2016 for Shoulder Pain (right)  The patient fell a few years ago and had significant right shoulder pain for some time. It seemed to have gotten better as time went on. However in the past few weeks she has had a great increase in the pain in her lateral shoulder and slightly posterior. It bothers her when she lifts up above her head. She feels that she is slightly weaker in that arm. It does bother her when carrying something in on that side that weighs very much. She has night awakenings due to pain when she rolls over on it.  Relevant past medical, surgical, family and social history reviewed and updated as indicated. Allergies and medications reviewed and updated.  Past Medical History:  Diagnosis Date  . Arthritis    "knees" (04/22/2012)  . Bronchitis   . Chronic bronchitis ([place])    "yearly; when the weather changes" (04/22/2012)  . Chronic kidney disease    kidney stones  . Colon polyps    adenomatous and hyperplastic-  . Constipation   . Depression   . Eczema   . Epilepsy ([place])    "been having them right often here lately" (04/22/2012)  . Fatty liver   . DJTTSVXB(939.0)    "1/wk" (04/22/2012)  . High cholesterol   . History of kidney stones   . Osteoarthritis    [name] 04/22/2012  . Other convulsions 05/21/12   non-epileptic spells  . Rectal bleed    in toilet- bright red  . Restless leg   . Seizures ([place])   . Vertigo     Past Surgical History:  Procedure Laterality Date  . ABDOMINAL HYSTERECTOMY  2001  . BLADDER SUSPENSION    . BUNIONECTOMY Left 2000  . CESAREAN SECTION  1987; 1988  . COLONOSCOPY    . JOINT REPLACEMENT    . MASS EXCISION  10/22/2011   Procedure: EXCISION MASS;  Surgeon:  [name], MD;  Location: [facility];  Service: General;  Laterality: Right;  excision right buttock mass  . TOTAL HIP ARTHROPLASTY Left 1993; 1995; 2000  . TOTAL KNEE ARTHROPLASTY Left 05/07/2015   Procedure: TOTAL LEFT KNEE ARTHROPLASTY;  Surgeon: [name], MD;  [facility];  Service: Orthopedics;  Laterality: Left;  . TOTAL KNEE ARTHROPLASTY Right 10/01/2015   Procedure: RIGHT TOTAL KNEE ARTHROPLASTY;  Surgeon: [name], MD;  Location: [facility];  Service: Orthopedics;  Laterality: Right;    Review of Systems  Constitutional: Negative.   HENT: Negative.   Eyes: Negative.   Respiratory: Negative.   Gastrointestinal: Negative.   Genitourinary: Negative.   Musculoskeletal: Positive for arthralgias and myalgias.    Allergies as of 09/05/2016      Reactions   Acetaminophen Other (See Comments)   Has fatty deposits on liver   Benadryl [diphenhydramine Hcl] Other (See Comments)   hyperactivity and seizures   Codeine Itching, Rash   seizures   Dilantin [phenytoin Sodium Extended] Other (See Comments)   Elevated LFT's   Melatonin Other (See Comments)   seizures   Ultram [tramadol Hcl] Other (See Comments)   Seizures  Vimpat [lacosamide] Other (See Comments)   Severe dizziness   Betadine [povidone Iodine] Rash   Latex Rash   Penicillins Itching, Rash   Has patient had a PCN reaction causing immediate rash, facial/tongue/throat swelling, SOB or lightheadedness with hypotension: No Has patient had a PCN reaction causing severe rash involving mucus membranes or skin necrosis: No Has patient had a PCN reaction that required hospitalization No Has patient had a PCN reaction occurring within the last 10 years: No If all of the above answers are "NO", then may proceed with Cephalosporin use.   Sulfa Antibiotics Rash   Tape Rash   Paper tape please   Vicodin [hydrocodone-acetaminophen] Itching      Medication List       Accurate as of 09/05/16  1:57 PM. Always use  your most recent med list.          clonazePAM 0.5 MG tablet Commonly known as:  KLONOPIN Take 1 tablet (0.5 mg total) by mouth daily as needed for anxiety.   cyclobenzaprine 10 MG tablet Commonly known as:  FLEXERIL Take 1 tablet (10 mg total) by mouth 3 (three) times daily as needed for muscle spasms.   FLUoxetine HCl 60 MG Tabs Take 60 mg by mouth daily.   fluticasone 50 MCG/ACT nasal spray Commonly known as:  FLONASE Place 2 sprays into both nostrils daily.   gabapentin 600 MG tablet Commonly known as:  NEURONTIN TAKE 1 TABLET BY MOUTH THREE TIMES DAILY   lamoTRIgine 100 MG tablet Commonly known as:  LAMICTAL Take 1 tablet (100 mg total) by mouth 2 (two) times daily.   levETIRAcetam 500 MG tablet Commonly known as:  KEPPRA Take 3 tablets (1,500 mg total) by mouth 2 (two) times daily.   loratadine 10 MG tablet Commonly known as:  CLARITIN Take 1 tablet (10 mg total) by mouth daily.   mirtazapine 30 MG tablet Commonly known as:  REMERON Take 1 tablet (30 mg total) by mouth at bedtime.   multivitamin with minerals Tabs tablet Take 1 tablet by mouth daily.   pramipexole 0.5 MG tablet Commonly known as:  MIRAPEX TAKE ONE TABLET BY MOUTH TWICE DAILY   predniSONE 10 MG (48) Tbpk tablet Commonly known as:  STERAPRED UNI-PAK 48 TAB Take 12 day pack as directed   traZODone 150 MG tablet Commonly known as:  DESYREL Take 1 tablet (150 mg total) by mouth at bedtime.            Discharge Care Instructions        Start     Ordered   09/05/16 0000  cyclobenzaprine (FLEXERIL) 10 MG tablet  3 times daily PRN    Question:  Supervising Provider  Answer:  Timmothy Euler   09/05/16 1203   09/05/16 0000  predniSONE (STERAPRED UNI-PAK 48 TAB) 10 MG (48) TBPK tablet    Question:  Supervising Provider  Answer:  Timmothy Euler   09/05/16 1203         Objective:    BP 111/74   Pulse 88   Temp 98 F (36.7 C) (Oral)   Ht 5' (1.524 m)   Wt 158 lb (71.7  kg)   BMI 30.86 kg/m   Allergies  Allergen Reactions  . Acetaminophen Other (See Comments)    Has fatty deposits on liver  . Benadryl [Diphenhydramine Hcl] Other (See Comments)    hyperactivity and seizures  . Codeine Itching and Rash    seizures  . Dilantin [Phenytoin Sodium Extended]  Other (See Comments)    Elevated LFT's  . Melatonin Other (See Comments)    seizures  . Ultram [Tramadol Hcl] Other (See Comments)    Seizures  . Vimpat [Lacosamide] Other (See Comments)    Severe dizziness  . Betadine [Povidone Iodine] Rash  . Latex Rash  . Penicillins Itching and Rash    Has patient had a PCN reaction causing immediate rash, facial/tongue/throat swelling, SOB or lightheadedness with hypotension: No Has patient had a PCN reaction causing severe rash involving mucus membranes or skin necrosis: No Has patient had a PCN reaction that required hospitalization No Has patient had a PCN reaction occurring within the last 10 years: No If all of the above answers are "NO", then may proceed with Cephalosporin use.  . Sulfa Antibiotics Rash  . Tape Rash    Paper tape please  . Vicodin [Hydrocodone-Acetaminophen] Itching    Physical Exam  Constitutional: She is oriented to person, place, and time. She appears well-developed and well-nourished.  HENT:  Head: Normocephalic and atraumatic.  Eyes: Pupils are equal, round, and reactive to light. Conjunctivae and EOM are normal.  Cardiovascular: Normal rate, regular rhythm, normal heart sounds and intact distal pulses.   Pulmonary/Chest: Effort normal and breath sounds normal.  Abdominal: Soft. Bowel sounds are normal.  Musculoskeletal:       Right shoulder: She exhibits decreased range of motion, tenderness, crepitus, pain, spasm and decreased strength. She exhibits no swelling and no effusion.       Arms: Neurological: She is alert and oriented to person, place, and time. She has normal reflexes.  Skin: Skin is warm and dry. No rash  noted.  Psychiatric: She has a normal mood and affect. Her behavior is normal. Judgment and thought content normal.  Nursing note and vitals reviewed.       Assessment & Plan:   1. Chronic right shoulder pain - cyclobenzaprine (FLEXERIL) 10 MG tablet; Take 1 tablet (10 mg total) by mouth 3 (three) times daily as needed for muscle spasms.  Dispense: 60 tablet; Refill: 0 - predniSONE (STERAPRED UNI-PAK 48 TAB) 10 MG (48) TBPK tablet; Take 12 day pack as directed  Dispense: 48 tablet; Refill: 0    Current Outpatient Prescriptions:  .  clonazePAM (KLONOPIN) 0.5 MG tablet, Take 1 tablet (0.5 mg total) by mouth daily as needed for anxiety., Disp: 30 tablet, Rfl: 0 .  FLUoxetine HCl 60 MG TABS, Take 60 mg by mouth daily., Disp: 30 tablet, Rfl: 1 .  fluticasone (FLONASE) 50 MCG/ACT nasal spray, Place 2 sprays into both nostrils daily., Disp: 16 g, Rfl: 6 .  gabapentin (NEURONTIN) 600 MG tablet, TAKE 1 TABLET BY MOUTH THREE TIMES DAILY, Disp: 90 tablet, Rfl: 2 .  lamoTRIgine (LAMICTAL) 100 MG tablet, Take 1 tablet (100 mg total) by mouth 2 (two) times daily., Disp: 60 tablet, Rfl: 11 .  levETIRAcetam (KEPPRA) 500 MG tablet, Take 3 tablets (1,500 mg total) by mouth 2 (two) times daily., Disp: 180 tablet, Rfl: 11 .  loratadine (CLARITIN) 10 MG tablet, Take 1 tablet (10 mg total) by mouth daily., Disp: 30 tablet, Rfl: 11 .  mirtazapine (REMERON) 30 MG tablet, Take 1 tablet (30 mg total) by mouth at bedtime., Disp: 30 tablet, Rfl: 1 .  Multiple Vitamin (MULTIVITAMIN WITH MINERALS) TABS tablet, Take 1 tablet by mouth daily., Disp: , Rfl:  .  pramipexole (MIRAPEX) 0.5 MG tablet, TAKE ONE TABLET BY MOUTH TWICE DAILY, Disp: 60 tablet, Rfl: 3 .  traZODone (DESYREL)  150 MG tablet, Take 1 tablet (150 mg total) by mouth at bedtime., Disp: 30 tablet, Rfl: 5 .  cyclobenzaprine (FLEXERIL) 10 MG tablet, Take 1 tablet (10 mg total) by mouth 3 (three) times daily as needed for muscle spasms., Disp: 60 tablet, Rfl:  0 .  predniSONE (STERAPRED UNI-PAK 48 TAB) 10 MG (48) TBPK tablet, Take 12 day pack as directed, Disp: 48 tablet, Rfl: 0 Continue all other maintenance medications as listed above.  Follow up plan: Return in about 2 weeks (around 09/19/2016) for PCP for recheck shoulder.  Educational handout given for shoulder exercises  [name] PA-C [facility] [address]  [place], [place] [address] [phone]   09/05/2016, 1:57 PM

## 2016-09-05 NOTE — Patient Instructions (Signed)

## 2016-09-23 ENCOUNTER — Encounter (HOSPITAL_COMMUNITY): Payer: Self-pay | Admitting: Psychiatry

## 2016-09-23 ENCOUNTER — Ambulatory Visit (INDEPENDENT_AMBULATORY_CARE_PROVIDER_SITE_OTHER): Payer: PPO | Admitting: Psychiatry

## 2016-09-23 ENCOUNTER — Telehealth (HOSPITAL_COMMUNITY): Payer: Self-pay | Admitting: *Deleted

## 2016-09-23 VITALS — BP 109/68 | HR 84 | Ht 60.0 in | Wt 162.0 lb

## 2016-09-23 DIAGNOSIS — Z634 Disappearance and death of family member: Secondary | ICD-10-CM

## 2016-09-23 DIAGNOSIS — Z813 Family history of other psychoactive substance abuse and dependence: Secondary | ICD-10-CM | POA: Diagnosis not present

## 2016-09-23 DIAGNOSIS — R471 Dysarthria and anarthria: Secondary | ICD-10-CM

## 2016-09-23 DIAGNOSIS — Z818 Family history of other mental and behavioral disorders: Secondary | ICD-10-CM

## 2016-09-23 DIAGNOSIS — F331 Major depressive disorder, recurrent, moderate: Secondary | ICD-10-CM

## 2016-09-23 DIAGNOSIS — Z6379 Other stressful life events affecting family and household: Secondary | ICD-10-CM

## 2016-09-23 DIAGNOSIS — F419 Anxiety disorder, unspecified: Secondary | ICD-10-CM

## 2016-09-23 DIAGNOSIS — R450 Nervousness: Secondary | ICD-10-CM | POA: Diagnosis not present

## 2016-09-23 MED ORDER — MIRTAZAPINE 30 MG PO TABS: 30.0000 mg | ORAL_TABLET | Freq: Every day | ORAL | 1 refills | Status: DC

## 2016-09-23 MED ORDER — CLONAZEPAM 0.5 MG PO TABS: 0.5000 mg | ORAL_TABLET | Freq: Every day | ORAL | 0 refills | Status: DC | PRN

## 2016-09-23 MED ORDER — FLUOXETINE HCL 60 MG PO TABS: 60.0000 mg | ORAL_TABLET | Freq: Every day | ORAL | 1 refills | Status: DC

## 2016-09-23 NOTE — Progress Notes (Signed)
[facility] MD/PA/NP OP Progress Note  09/23/2016 2:50 PM [name]  MRN:  [id number]  Chief Complaint:  Chief Complaint    Follow-up; Depression     HPI:  Patient presents for follow up appointment for depression. She states that she had seizure last week. She believes she has been stressed by her son in prison, and takes care of her husband. She feels frustrated when her son asked for money. She tries to leave it as he is grown up. She talks about her husband with leukemia; he has started to go to church and talk with ministry. He is prepared for his grave. She is sad for to see him this way. She reports that her son and his wife moved out from the house; she feels a little lonely. She reports good relationship with her husband and enjoys watching TV together. She has fair sleep. She has difficulty with concentration. She denies SI. She feels anxious and has panic attacks once a month. She takes clonazepam once a day.   Per [facility] database Clonazepam prescribed on 08/07/2016   Visit Diagnosis:    ICD-10-CM   1. 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 Outpatient: [facility],  Psychiatry admission: in 2004 for suicide attempt (per note, she was also admitted in 09/04/2010-09/06/2010)  Previous suicide attempt: overdosing flexeril in 2004,  Past trials of medication: fluoxetine, citalopram, Trazodone, lorazepam, clonazepam History of violence: denies  Past Medical History:  Past Medical History:  Diagnosis Date  . Arthritis    "knees" (04/22/2012)  . Bronchitis   . Chronic bronchitis ([place])    "yearly; when the weather changes" (04/22/2012)  . Chronic kidney disease    kidney stones  . Colon polyps    adenomatous and hyperplastic-  . Constipation   . Depression   . Eczema   . Epilepsy ([place])    "been having them right often here lately"  (04/22/2012)  . Fatty liver   . WUJWJXBJ(478.2)    "1/wk" (04/22/2012)  . High cholesterol   . History of kidney stones   . Osteoarthritis    [name] 04/22/2012  . Other convulsions 05/21/12   non-epileptic spells  . Rectal bleed    in toilet- bright red  . Restless leg   . Seizures ([place])   . Vertigo     Past Surgical History:  Procedure Laterality Date  . ABDOMINAL HYSTERECTOMY  2001  . BLADDER SUSPENSION    . BUNIONECTOMY Left 2000  . CESAREAN SECTION  1987; 1988  . COLONOSCOPY    . JOINT REPLACEMENT    . MASS EXCISION  10/22/2011   Procedure: EXCISION MASS;  Surgeon: [name], MD;  Location: [facility];  Service: General;  Laterality: Right;  excision right buttock mass  . TOTAL HIP ARTHROPLASTY Left 1993; 1995; 2000  . TOTAL KNEE ARTHROPLASTY Left 05/07/2015   Procedure: TOTAL LEFT KNEE ARTHROPLASTY;  Surgeon: [name], MD;  [facility];  Service: Orthopedics;  Laterality: Left;  . TOTAL KNEE ARTHROPLASTY Right 10/01/2015   Procedure: RIGHT TOTAL KNEE ARTHROPLASTY;  Surgeon: [name], MD;  [facility];  Service: Orthopedics;  Laterality: Right;    Family Psychiatric History:  I have reviewed the patient's family history in detail and updated the patient record.  Family History:  Family History  Problem Relation Age of Onset  . Cancer Mother   .  Depression Father   . Cancer Brother   . Diabetes Brother   . Cancer Maternal Aunt   . Diabetes Maternal Aunt   . Bipolar disorder Son   . Drug abuse Son     Social History:  Social History   Social History  . Marital status: Married    Spouse name: [name]  . Number of children: 2  . Years of education: 12 +   Occupational History  . unemployed Other   Social History Main Topics  . Smoking status: Never Smoker  . Smokeless tobacco: Never Used  . Alcohol use No     Comment: 12-25-2015 per pt no  . Drug use: No     Comment: 21-12-2015 per pt no   . Sexual activity: Not Currently    Birth  control/ protection: Surgical   Other Topics Concern  . None   Social History Narrative   Patient is married [name]) and lives at home with her husband.   Patient has two adult children.   Patient is disabled.   Patient has a high school education and some trade school.   Patient is right-handed.   Patient drinks four glasses of soda and tea daily.    Allergies:  Allergies  Allergen Reactions  . Acetaminophen Other (See Comments)    Has fatty deposits on liver  . Benadryl [Diphenhydramine Hcl] Other (See Comments)    hyperactivity and seizures  . Codeine Itching and Rash    seizures  . Dilantin [Phenytoin Sodium Extended] Other (See Comments)    Elevated LFT's  . Melatonin Other (See Comments)    seizures  . Ultram [Tramadol Hcl] Other (See Comments)    Seizures  . Vimpat [Lacosamide] Other (See Comments)    Severe dizziness  . Betadine [Povidone Iodine] Rash  . Latex Rash  . Penicillins Itching and Rash    Has patient had a PCN reaction causing immediate rash, facial/tongue/throat swelling, SOB or lightheadedness with hypotension: No Has patient had a PCN reaction causing severe rash involving mucus membranes or skin necrosis: No Has patient had a PCN reaction that required hospitalization No Has patient had a PCN reaction occurring within the last 10 years: No If all of the above answers are "NO", then may proceed with Cephalosporin use.  . Sulfa Antibiotics Rash  . Tape Rash    Paper tape please  . Vicodin [Hydrocodone-Acetaminophen] Itching    Metabolic Disorder Labs: Lab Results  Component Value Date   HGBA1C 5.4 11/28/2015   MPG 108 11/28/2015   No results found for: PROLACTIN Lab Results  Component Value Date   CHOL 164 07/07/2016   TRIG 124 07/07/2016   HDL 53 07/07/2016   CHOLHDL 3.1 07/07/2016   LDLCALC 86 07/07/2016   LDLCALC 136 (H) 04/09/2015   Lab Results  Component Value Date   TSH 1.900 07/07/2016   TSH 2.559 12/03/2015     Therapeutic Level Labs: No results found for: LITHIUM No results found for: VALPROATE No components found for:  CBMZ  Current Medications: Current Outpatient Prescriptions  Medication Sig Dispense Refill  . clonazePAM (KLONOPIN) 0.5 MG tablet Take 1 tablet (0.5 mg total) by mouth daily as needed for anxiety. 30 tablet 0  . FLUoxetine HCl 60 MG TABS Take 60 mg by mouth daily. 30 tablet 1  . fluticasone (FLONASE) 50 MCG/ACT nasal spray Place 2 sprays into both nostrils daily. 16 g 6  . gabapentin (NEURONTIN) 600 MG tablet TAKE 1 TABLET BY MOUTH THREE  TIMES DAILY 90 tablet 2  . lamoTRIgine (LAMICTAL) 100 MG tablet Take 1 tablet (100 mg total) by mouth 2 (two) times daily. 60 tablet 11  . levETIRAcetam (KEPPRA) 500 MG tablet Take 3 tablets (1,500 mg total) by mouth 2 (two) times daily. 180 tablet 11  . loratadine (CLARITIN) 10 MG tablet Take 1 tablet (10 mg total) by mouth daily. 30 tablet 11  . Multiple Vitamin (MULTIVITAMIN WITH MINERALS) TABS tablet Take 1 tablet by mouth daily.    . pramipexole (MIRAPEX) 0.5 MG tablet TAKE ONE TABLET BY MOUTH TWICE DAILY 60 tablet 3  . predniSONE (STERAPRED UNI-PAK 48 TAB) 10 MG (48) TBPK tablet Take 12 day pack as directed 48 tablet 0  . traZODone (DESYREL) 150 MG tablet Take 1 tablet (150 mg total) by mouth at bedtime. 30 tablet 5  . cyclobenzaprine (FLEXERIL) 10 MG tablet Take 1 tablet (10 mg total) by mouth 3 (three) times daily as needed for muscle spasms. (Patient not taking: Reported on 09/23/2016) 60 tablet 0  . mirtazapine (REMERON) 30 MG tablet Take 1 tablet (30 mg total) by mouth at bedtime. 30 tablet 1   No current facility-administered medications for this visit.      Musculoskeletal: Strength & Muscle Tone: within normal limits Gait & Station: normal Patient leans: N/A  Psychiatric Specialty Exam: Review of Systems  Neurological: Positive for speech change and seizures. Negative for dizziness, tingling, tremors, focal weakness and  headaches.  Psychiatric/Behavioral: Positive for depression. Negative for hallucinations, substance abuse and suicidal ideas. The patient is nervous/anxious. The patient does not have insomnia.   All other systems reviewed and are negative.   Blood pressure 109/68, pulse 84, height 5' (1.524 m), weight 162 lb (73.5 kg).Body mass index is 31.64 kg/m.  General Appearance: Fairly Groomed  Eye Contact:  Good  Speech:  Clear and Coherent  Volume:  Normal  Mood:  "fine"  Affect:  Appropriate, Congruent and slightly down  Thought Process:  Coherent and Goal Directed  Orientation:  Full (Time, Place, and Person)  Thought Content: Logical Perceptions: denies AH/VH  Suicidal Thoughts:  No  Homicidal Thoughts:  No  Memory:  Immediate;   Good Recent;   Good Remote;   Good  Judgement:  Good  Insight:  Fair  Psychomotor Activity:  Normal  Concentration:  Concentration: Good and Attention Span: Good  Recall:  Good  Fund of Knowledge: Good  Language: Good  Akathisia:  No  Handed:  Right  AIMS (if indicated): not done, no tremors, no rigidity, CNs intact, no motor weakness   Assets:  Communication Skills Desire for Improvement  ADL's:  Intact  Cognition: WNL  Sleep:  Fair   Screenings: GAD-7     Office Visit from 01/31/2015 in [facility]  Total GAD-7 Score  15    Mini-Mental     Office Visit from 02/20/2014 in [facility]  Total Score (max 30 points )  30    PHQ2-9     Office Visit from 09/05/2016 in [facility] Office Visit from 07/07/2016 in [facility] Office Visit from 05/19/2016 in [facility] Office Visit from 05/09/2016 in [facility] Office Visit from 03/28/2016 in [facility]  PHQ-2 Total Score  0  0  0  0  0       Assessment and Plan:  [name] is a 51 y.o. year old female with a history of depression,  pseudoseizure,  partial symptomatic epilepsy with complex partial seizures, osteoarthritis, congenital hip dislocation s/p replacement, who presents for follow up appointment for Moderate episode of recurrent major depressive disorder ([place])  # MDD, moderate, recurrent without psychotic features # Bereavement There has been overall improvement in neurovegetative symptoms despite ongoing stress of being a care giver of her husband with leukemia, her son in prison. Will continue fluoxetine, mirtazapine for depression and clonazepam prn for anxiety. She will continue trazodone prn for insomnia. Discussed behavioral activation.   # Dysarthria Slightly worsened dysarthria is observed. She denies muscle weakness, and no significant neurological abnormality on exam to concern for stroke. This may be secondary to dry mouth. She is advised to seek care at ED if any worsening in her symptoms.   Plan 1. Continue fluoxetine 60 mg daily 2. Continue mirtazapine 30 mg at night 3. Continue clonazepam 0.5 mg daily as needed for anxiety  4. Continue Trazodone 150 mg at night for sleep 5. Return to clinic in one month Patient is on gabapentin, Lamictal, Keppra for seizure. She is also on pramipexole for restless leg  The patient demonstrates the following risk factors for suicide: Chronic risk factors for suicide include: psychiatric disorder of depressionand previous suicide attempts of overdosing medication. Acute risk factorsfor suicide include: unemployment and her husband with terminal illness. Protective factorsfor this patient include: positive social support, coping skills and hope for the future. Considering these factors, the overall suicide risk at this point appears to be low. Patient isappropriate for outpatient follow up.  The duration of this appointment visit was 30 minutes of face-to-face time with the patient.  Greater than 50% of this time was spent in counseling, explanation of  diagnosis, planning of  further management, and coordination of care  Norman Clay, MD 09/23/2016, 2:50 PM

## 2016-09-23 NOTE — Telephone Encounter (Signed)
Staff called pt again per previous message. Staff spoke with pt and appt was sch for today 09-23-2016 and pt agreed with appt.

## 2016-09-23 NOTE — Telephone Encounter (Signed)
Office received refills fax for pt pharmacy [facility] in [place] for pt Clonazepam 0.5mg  BID PRN. Pt medication was filled on 07-29-2016 with 30 tabs 0 refill. Pt last appt was on the same day of medication was refilled. Pt do not have f/u appt. Staff called pt to resch appt. When staff, staff was unable to get through due to phone giving a busy tone. Staff called 3 times and call still did not go through. Pt pharmacy number is [phone].

## 2016-09-23 NOTE — Telephone Encounter (Signed)
Will not order refill until she calls [name] for f/u appointment.

## 2016-09-23 NOTE — Patient Instructions (Signed)
1. Continue fluoxetine 60 mg daily 2. Continue mirtazapine 30 mg at night 3. Continue clonazepam 0.5 mg daily as needed for anxiety  4. Continue Trazodone 150 mg at night for sleep 5. Return to clinic in one month

## 2016-10-08 ENCOUNTER — Ambulatory Visit (HOSPITAL_COMMUNITY): Payer: Self-pay | Admitting: Psychology

## 2016-10-08 ENCOUNTER — Encounter (HOSPITAL_COMMUNITY): Payer: Self-pay | Admitting: Psychology

## 2016-10-08 NOTE — Progress Notes (Signed)
[name] is a 51 y.o. female patient who didn't show for appointment.  Letter sent.        Jan Fireman, LPC

## 2016-10-16 NOTE — Progress Notes (Signed)
[facility] MD/PA/NP OP Progress Note  10/20/2016 4:39 PM [name]  MRN:  [id number]  Chief Complaint:  Chief Complaint    Follow-up; Depression     HPI:  She presents for follow up appointment for depression. She states that her son with drug use moved to other prison in [place]. Her son and her husband has had discordance due to her son using drugs. Her sister has a custody of his daughter and she reports concern that the girl is frustrated that she has not been able to see her mother. She reports concern about her husband with leukemia, although she tries to let him outside the house to take a walk. She feels anxious and has racing thoughts. She has insomnia. She has panic attacks at times. She has fair energy. She denies SI. She denies seizure since the last appointment.   Per PMP Clonazepam filled on 09/24/2016  Visit Diagnosis:    ICD-10-CM   1. Moderate episode of recurrent major depressive disorder ([place]) F33.1     Past Psychiatric History:  I have reviewed the patient's psychiatry history in detail and updated the patient record. Outpatient: [facility],  Psychiatry admission: in 2004 for suicide attempt (per note, she was also admitted in 09/04/2010-09/06/2010)  Previous suicide attempt: overdosing flexeril in 2004,  Past trials of medication: fluoxetine, citalopram, Trazodone, lorazepam, clonazepam History of violence: denies  Past Medical History:  Past Medical History:  Diagnosis Date  . Arthritis    "knees" (04/22/2012)  . Bronchitis   . Chronic bronchitis ([place])    "yearly; when the weather changes" (04/22/2012)  . Chronic kidney disease    kidney stones  . Colon polyps    adenomatous and hyperplastic-  . Constipation   . Depression   . Eczema   . Epilepsy ([place])    "been having them right often here lately" (04/22/2012)  . Fatty liver   . NTIRWERX(540.0)    "1/wk" (04/22/2012)  . High cholesterol   . History of kidney stones   . Osteoarthritis    [name]  04/22/2012  . Other convulsions 05/21/12   non-epileptic spells  . Rectal bleed    in toilet- bright red  . Restless leg   . Seizures ([place])   . Vertigo     Past Surgical History:  Procedure Laterality Date  . ABDOMINAL HYSTERECTOMY  2001  . BLADDER SUSPENSION    . BUNIONECTOMY Left 2000  . CESAREAN SECTION  1987; 1988  . COLONOSCOPY    . JOINT REPLACEMENT    . MASS EXCISION  10/22/2011   Procedure: EXCISION MASS;  Surgeon: [name], MD;  Location: [facility];  Service: General;  Laterality: Right;  excision right buttock mass  . TOTAL HIP ARTHROPLASTY Left 1993; 1995; 2000  . TOTAL KNEE ARTHROPLASTY Left 05/07/2015   Procedure: TOTAL LEFT KNEE ARTHROPLASTY;  Surgeon: [name], MD;  Location: [facility];  Service: Orthopedics;  Laterality: Left;  . TOTAL KNEE ARTHROPLASTY Right 10/01/2015   Procedure: RIGHT TOTAL KNEE ARTHROPLASTY;  Surgeon: [name], MD;  Location: [facility];  Service: Orthopedics;  Laterality: Right;    Family Psychiatric History: I have reviewed the patient's family history in detail and updated the patient record.  Family History:  Family History  Problem Relation Age of Onset  . Cancer Mother   . Depression Father   . Cancer Brother   . Diabetes Brother   . Cancer Maternal Aunt   . Diabetes Maternal Aunt   . Bipolar disorder  Son   . Drug abuse Son     Social History:  Social History   Social History  . Marital status: Married    Spouse name: [name]  . Number of children: 2  . Years of education: 12 +   Occupational History  . unemployed Other   Social History Main Topics  . Smoking status: Never Smoker  . Smokeless tobacco: Never Used  . Alcohol use No     Comment: 12-25-2015 per pt no  . Drug use: No     Comment: 21-12-2015 per pt no   . Sexual activity: Not Currently    Birth control/ protection: Surgical   Other Topics Concern  . Not on file   Social History Narrative   Patient is married [name]) and lives at home with her  husband.   Patient has two adult children.   Patient is disabled.   Patient has a high school education and some trade school.   Patient is right-handed.   Patient drinks four glasses of soda and tea daily.    Allergies:  Allergies  Allergen Reactions  . Acetaminophen Other (See Comments)    Has fatty deposits on liver  . Benadryl [Diphenhydramine Hcl] Other (See Comments)    hyperactivity and seizures  . Codeine Itching and Rash    seizures  . Dilantin [Phenytoin Sodium Extended] Other (See Comments)    Elevated LFT's  . Melatonin Other (See Comments)    seizures  . Ultram [Tramadol Hcl] Other (See Comments)    Seizures  . Vimpat [Lacosamide] Other (See Comments)    Severe dizziness  . Betadine [Povidone Iodine] Rash  . Latex Rash  . Penicillins Itching and Rash    Has patient had a PCN reaction causing immediate rash, facial/tongue/throat swelling, SOB or lightheadedness with hypotension: No Has patient had a PCN reaction causing severe rash involving mucus membranes or skin necrosis: No Has patient had a PCN reaction that required hospitalization No Has patient had a PCN reaction occurring within the last 10 years: No If all of the above answers are "NO", then may proceed with Cephalosporin use.  . Sulfa Antibiotics Rash  . Tape Rash    Paper tape please  . Vicodin [Hydrocodone-Acetaminophen] Itching    Metabolic Disorder Labs: Lab Results  Component Value Date   HGBA1C 5.4 11/28/2015   MPG 108 11/28/2015   No results found for: PROLACTIN Lab Results  Component Value Date   CHOL 164 07/07/2016   TRIG 124 07/07/2016   HDL 53 07/07/2016   CHOLHDL 3.1 07/07/2016   LDLCALC 86 07/07/2016   LDLCALC 136 (H) 04/09/2015   Lab Results  Component Value Date   TSH 1.900 07/07/2016   TSH 2.559 12/03/2015    Therapeutic Level Labs: No results found for: LITHIUM No results found for: VALPROATE No components found for:  CBMZ  Current Medications: Current  Outpatient Prescriptions  Medication Sig Dispense Refill  . clonazePAM (KLONOPIN) 0.5 MG tablet Take 1 tablet (0.5 mg total) by mouth daily as needed for anxiety. 30 tablet 0  . cyclobenzaprine (FLEXERIL) 10 MG tablet Take 1 tablet (10 mg total) by mouth 3 (three) times daily as needed for muscle spasms. (Patient not taking: Reported on 09/23/2016) 60 tablet 0  . FLUoxetine HCl 60 MG TABS Take 60 mg by mouth daily. 30 tablet 1  . fluticasone (FLONASE) 50 MCG/ACT nasal spray Place 2 sprays into both nostrils daily. 16 g 6  . gabapentin (NEURONTIN) 600 MG tablet  TAKE 1 TABLET BY MOUTH THREE TIMES DAILY 90 tablet 2  . lamoTRIgine (LAMICTAL) 100 MG tablet Take 1 tablet (100 mg total) by mouth 2 (two) times daily. 60 tablet 11  . levETIRAcetam (KEPPRA) 500 MG tablet Take 3 tablets (1,500 mg total) by mouth 2 (two) times daily. 180 tablet 11  . loratadine (CLARITIN) 10 MG tablet Take 1 tablet (10 mg total) by mouth daily. 30 tablet 11  . mirtazapine (REMERON) 45 MG tablet Take 1 tablet (45 mg total) by mouth at bedtime. 30 tablet 1  . Multiple Vitamin (MULTIVITAMIN WITH MINERALS) TABS tablet Take 1 tablet by mouth daily.    . pramipexole (MIRAPEX) 0.5 MG tablet TAKE ONE TABLET BY MOUTH TWICE DAILY 60 tablet 3  . predniSONE (STERAPRED UNI-PAK 48 TAB) 10 MG (48) TBPK tablet Take 12 day pack as directed 48 tablet 0  . traZODone (DESYREL) 150 MG tablet Take 1 tablet (150 mg total) by mouth at bedtime. 30 tablet 5   No current facility-administered medications for this visit.      Musculoskeletal: Strength & Muscle Tone: within normal limits Gait & Station: normal Patient leans: N/A  Psychiatric Specialty Exam: Review of Systems  Psychiatric/Behavioral: Positive for depression. Negative for hallucinations, substance abuse and suicidal ideas. The patient is nervous/anxious and has insomnia.   All other systems reviewed and are negative.   Blood pressure 112/72, pulse 82, height 5' (1.524 m), weight  160 lb 3.2 oz (72.7 kg).Body mass index is 31.29 kg/m.  General Appearance: Fairly Groomed  Eye Contact:  Good  Speech:  Clear and Coherent  Volume:  Normal  Mood:  Anxious  Affect:  Appropriate, Congruent and slightly down  Thought Process:  Coherent and Goal Directed  Orientation:  Full (Time, Place, and Person)  Thought Content: Logical Perceptions: denies AH/VH  Suicidal Thoughts:  No  Homicidal Thoughts:  No  Memory:  Immediate;   Good Recent;   Good Remote;   Good  Judgement:  Good  Insight:  Fair  Psychomotor Activity:  Normal  Concentration:  Concentration: Good and Attention Span: Good  Recall:  Good  Fund of Knowledge: Good  Language: Good  Akathisia:  No  Handed:  Right  AIMS (if indicated): not done  Assets:  Communication Skills Desire for Improvement  ADL's:  Intact  Cognition: WNL  Sleep:  Poor   Screenings: GAD-7     Office Visit from 01/31/2015 in [facility]  Total GAD-7 Score  15    Mini-Mental     Office Visit from 02/20/2014 in [facility]  Total Score (max 30 points )  30    PHQ2-9     Office Visit from 09/05/2016 in [facility] Office Visit from 07/07/2016 in [facility] Visit from 05/19/2016 in [facility] Office Visit from 05/09/2016 in [facility] Visit from 03/28/2016 in [facility]  PHQ-2 Total Score  0  0  0  0  0       Assessment and Plan:  DERRICKA MERTZ is a 51 y.o. year old female with a history of depression, pseudoseizure, partial symptomatic epilepsy with complex partial seizures, osteoarthritis, congenital hip dislocation s/p replacement, who presents for follow up appointment for Moderate episode of recurrent major depressive disorder ([place])  # MDD, moderate, recurrent without psychotic features Although there has been overall improvement in neurovegetative symptoms,  she continues to have anxiety which interferes her  life. Will uptitrate mirtazapine to target residual mood symptoms. Will continue fluoxetine to target depression. Will continue clonazepam prn for anxiety and trazodone for sleep. Discussed behavioral activation.   Plan 1. Continue fluoxetine 60 mg daily  2. Increase mirtazapine 45 mg at night 3. Continue clonazepam 0.5 mg daily as needed for anxiety (she has one refill left- ordered another for a month to last until the next appointment) 4. Continue Trazodone 150 mg at night for sleep 5. Return to clinic in two months  Patient is on gabapentin, Lamictal, Keppra for seizure. She is also on pramipexole for restless leg  The patient demonstrates the following risk factors for suicide: Chronic risk factors for suicide include: psychiatric disorder of depressionand previous suicide attempts of overdosing medication. Acute risk factorsfor suicide include: unemployment and her husband with terminal illness. Protective factorsfor this patient include: positive social support, coping skills and hope for the future. Considering these factors, the overall suicide risk at this point appears to be low. Patient isappropriate for outpatient follow up.   Norman Clay, MD 10/20/2016, 4:39 PM

## 2016-10-20 ENCOUNTER — Ambulatory Visit (INDEPENDENT_AMBULATORY_CARE_PROVIDER_SITE_OTHER): Payer: PPO | Admitting: Psychiatry

## 2016-10-20 VITALS — BP 112/72 | HR 82 | Ht 60.0 in | Wt 160.2 lb

## 2016-10-20 DIAGNOSIS — F331 Major depressive disorder, recurrent, moderate: Secondary | ICD-10-CM

## 2016-10-20 DIAGNOSIS — Z818 Family history of other mental and behavioral disorders: Secondary | ICD-10-CM | POA: Diagnosis not present

## 2016-10-20 DIAGNOSIS — M1990 Unspecified osteoarthritis, unspecified site: Secondary | ICD-10-CM | POA: Diagnosis not present

## 2016-10-20 DIAGNOSIS — Z915 Personal history of self-harm: Secondary | ICD-10-CM

## 2016-10-20 DIAGNOSIS — Z96649 Presence of unspecified artificial hip joint: Secondary | ICD-10-CM

## 2016-10-20 DIAGNOSIS — G4700 Insomnia, unspecified: Secondary | ICD-10-CM | POA: Diagnosis not present

## 2016-10-20 DIAGNOSIS — G40209 Localization-related (focal) (partial) symptomatic epilepsy and epileptic syndromes with complex partial seizures, not intractable, without status epilepticus: Secondary | ICD-10-CM | POA: Diagnosis not present

## 2016-10-20 DIAGNOSIS — Z79899 Other long term (current) drug therapy: Secondary | ICD-10-CM

## 2016-10-20 DIAGNOSIS — F410 Panic disorder [episodic paroxysmal anxiety] without agoraphobia: Secondary | ICD-10-CM | POA: Diagnosis not present

## 2016-10-20 MED ORDER — CLONAZEPAM 0.5 MG PO TABS: 0.5000 mg | ORAL_TABLET | Freq: Every day | ORAL | 0 refills | Status: DC | PRN

## 2016-10-20 MED ORDER — FLUOXETINE HCL 60 MG PO TABS: 60.0000 mg | ORAL_TABLET | Freq: Every day | ORAL | 1 refills | Status: DC

## 2016-10-20 MED ORDER — MIRTAZAPINE 45 MG PO TABS: 45.0000 mg | ORAL_TABLET | Freq: Every day | ORAL | 1 refills | Status: DC

## 2016-10-20 MED ORDER — CLONAZEPAM 0.5 MG PO TABS: 0.5000 mg | ORAL_TABLET | Freq: Every day | ORAL | 1 refills | Status: DC | PRN

## 2016-10-20 NOTE — Patient Instructions (Signed)
1. Continue fluoxetine 60 mg daily  2. Increase mirtazapine 45 mg at night 3. Continue clonazepam 0.5 mg daily as needed for anxiety  4. Continue Trazodone 150 mg at night for wleep 5. Return to clinic in two months

## 2016-11-07 ENCOUNTER — Ambulatory Visit (INDEPENDENT_AMBULATORY_CARE_PROVIDER_SITE_OTHER): Payer: PPO | Admitting: Family Medicine

## 2016-11-07 ENCOUNTER — Encounter: Payer: Self-pay | Admitting: Family Medicine

## 2016-11-07 VITALS — BP 110/80 | HR 88 | Temp 99.1°F | Ht 60.0 in | Wt 165.8 lb

## 2016-11-07 DIAGNOSIS — H6983 Other specified disorders of Eustachian tube, bilateral: Secondary | ICD-10-CM

## 2016-11-07 DIAGNOSIS — J0110 Acute frontal sinusitis, unspecified: Secondary | ICD-10-CM | POA: Diagnosis not present

## 2016-11-07 DIAGNOSIS — B9789 Other viral agents as the cause of diseases classified elsewhere: Secondary | ICD-10-CM | POA: Diagnosis not present

## 2016-11-07 DIAGNOSIS — J988 Other specified respiratory disorders: Secondary | ICD-10-CM | POA: Diagnosis not present

## 2016-11-07 LAB — VERITOR FLU A/B WAIVED
Influenza A: NEGATIVE
Influenza B: NEGATIVE

## 2016-11-07 MED ORDER — FLUTICASONE PROPIONATE 50 MCG/ACT NA SUSP: 2.0000 | Freq: Every day | NASAL | 6 refills | Status: DC

## 2016-11-07 MED ORDER — LORATADINE 10 MG PO TABS: 10.0000 mg | ORAL_TABLET | Freq: Every day | ORAL | 11 refills | Status: DC

## 2016-11-07 NOTE — Addendum Note (Signed)
Addended by: Caryl Pina on: 11/07/2016 01:34 PM   Modules accepted: Orders

## 2016-11-07 NOTE — Progress Notes (Signed)
BP 110/80   Pulse 88   Temp 99.1 F (37.3 C) (Oral)   Ht 5' (1.524 m)   Wt 165 lb 12.8 oz (75.2 kg)   BMI 32.38 kg/m    Subjective:    Patient ID: [name], female    DOB: Jun 03, 1965, 51 y.o.   MRN: [id number]  HPI: [name] is a 51 y.o. female presenting on 11/07/2016 for Sore Throat; Ear Pain; No appetite; and Cough   HPI Cough and congestion and sore throat and ear pain Patient is coming in today with complaints of cough and congestion and sore throat ear pain that is been going on for the past 3 days.  She says her appetite's been decreased.  She says that she was exposed to the flu by a next-door neighbor who was diagnosed with flu positive.  She denies any shortness of breath or wheezing.  She does have a cough that is productive.  Relevant past medical, surgical, family and social history reviewed and updated as indicated. Interim medical history since our last visit reviewed. Allergies and medications reviewed and updated.  Review of Systems  Constitutional: Negative for chills and fever.  HENT: Positive for congestion, postnasal drip, rhinorrhea, sinus pressure, sneezing and sore throat. Negative for ear discharge and ear pain.   Eyes: Negative for pain, redness and visual disturbance.  Respiratory: Positive for cough. Negative for chest tightness, shortness of breath and wheezing.   Cardiovascular: Negative for chest pain and leg swelling.  Genitourinary: Negative for difficulty urinating and dysuria.  Musculoskeletal: Negative for back pain and gait problem.  Skin: Negative for rash.  Neurological: Negative for light-headedness and headaches.  Psychiatric/Behavioral: Negative for agitation and behavioral problems.  All other systems reviewed and are negative.   Per HPI unless specifically indicated above        Objective:    BP 110/80   Pulse 88   Temp 99.1 F (37.3 C) (Oral)   Ht 5' (1.524 m)   Wt 165 lb 12.8 oz (75.2 kg)   BMI 32.38  kg/m   Wt Readings from Last 3 Encounters:  11/07/16 165 lb 12.8 oz (75.2 kg)  09/05/16 158 lb (71.7 kg)  07/07/16 158 lb 6.4 oz (71.8 kg)    Physical Exam  Constitutional: She is oriented to person, place, and time. She appears well-developed and well-nourished. No distress.  HENT:  Right Ear: Tympanic membrane, external ear and ear canal normal.  Left Ear: Tympanic membrane, external ear and ear canal normal.  Nose: Mucosal edema and rhinorrhea present. No epistaxis. Right sinus exhibits no maxillary sinus tenderness and no frontal sinus tenderness. Left sinus exhibits no maxillary sinus tenderness and no frontal sinus tenderness.  Mouth/Throat: Uvula is midline and mucous membranes are normal. Posterior oropharyngeal edema and posterior oropharyngeal erythema present. No oropharyngeal exudate or tonsillar abscesses.  Eyes: Conjunctivae are normal.  Neck: Neck supple. No thyromegaly present.  Cardiovascular: Normal rate, regular rhythm, normal heart sounds and intact distal pulses.   No murmur heard. Pulmonary/Chest: Effort normal and breath sounds normal. No respiratory distress. She has no wheezes. She has no rales.  Musculoskeletal: Normal range of motion. She exhibits no edema.  Lymphadenopathy:    She has no cervical adenopathy.  Neurological: She is alert and oriented to person, place, and time. Coordination normal.  Skin: Skin is warm and dry. No rash noted. She is not diaphoretic.  Psychiatric: She has a normal mood and affect. Her behavior is normal.  Vitals reviewed.   Rapid flu: Negative    Assessment & Plan:   Problem List Items Addressed This Visit    None    Visit Diagnoses    Viral respiratory illness    -  Primary   Relevant Orders   Veritor Flu A/B Waived      Flu was negative, recommended for her to use Flonase and Mucinex and nasal saline sprays and call back if it is not better in 4 days  Follow up plan: Return if symptoms worsen or fail to  improve.  Counseling provided for all of the vaccine components Orders Placed This Encounter  Procedures  . Veritor Flu A/B [place] Mandry, MD [facility] 11/07/2016, 1:11 PM

## 2016-11-09 ENCOUNTER — Emergency Department (HOSPITAL_BASED_OUTPATIENT_CLINIC_OR_DEPARTMENT_OTHER): Payer: PPO

## 2016-11-09 ENCOUNTER — Inpatient Hospital Stay (HOSPITAL_BASED_OUTPATIENT_CLINIC_OR_DEPARTMENT_OTHER)
Admission: EM | Admit: 2016-11-09 | Discharge: 2016-11-12 | DRG: 195 | Disposition: A | Discharge status: Home / Self Care | Payer: PPO | Attending: Family Medicine | Admitting: Family Medicine

## 2016-11-09 ENCOUNTER — Encounter (HOSPITAL_BASED_OUTPATIENT_CLINIC_OR_DEPARTMENT_OTHER): Payer: Self-pay | Admitting: *Deleted

## 2016-11-09 DIAGNOSIS — J181 Lobar pneumonia, unspecified organism: Principal | ICD-10-CM | POA: Diagnosis present

## 2016-11-09 DIAGNOSIS — Z880 Allergy status to penicillin: Secondary | ICD-10-CM

## 2016-11-09 DIAGNOSIS — Z888 Allergy status to other drugs, medicaments and biological substances status: Secondary | ICD-10-CM

## 2016-11-09 DIAGNOSIS — Z818 Family history of other mental and behavioral disorders: Secondary | ICD-10-CM | POA: Diagnosis not present

## 2016-11-09 DIAGNOSIS — J189 Pneumonia, unspecified organism: Secondary | ICD-10-CM | POA: Diagnosis present

## 2016-11-09 DIAGNOSIS — Z96653 Presence of artificial knee joint, bilateral: Secondary | ICD-10-CM | POA: Diagnosis not present

## 2016-11-09 DIAGNOSIS — G2581 Restless legs syndrome: Secondary | ICD-10-CM | POA: Diagnosis not present

## 2016-11-09 DIAGNOSIS — F329 Major depressive disorder, single episode, unspecified: Secondary | ICD-10-CM | POA: Diagnosis present

## 2016-11-09 DIAGNOSIS — Z886 Allergy status to analgesic agent status: Secondary | ICD-10-CM

## 2016-11-09 DIAGNOSIS — F419 Anxiety disorder, unspecified: Secondary | ICD-10-CM | POA: Diagnosis not present

## 2016-11-09 DIAGNOSIS — Z91040 Latex allergy status: Secondary | ICD-10-CM

## 2016-11-09 DIAGNOSIS — D638 Anemia in other chronic diseases classified elsewhere: Secondary | ICD-10-CM | POA: Diagnosis not present

## 2016-11-09 DIAGNOSIS — Z882 Allergy status to sulfonamides status: Secondary | ICD-10-CM | POA: Diagnosis not present

## 2016-11-09 DIAGNOSIS — Z90710 Acquired absence of both cervix and uterus: Secondary | ICD-10-CM | POA: Diagnosis not present

## 2016-11-09 DIAGNOSIS — Z91048 Other nonmedicinal substance allergy status: Secondary | ICD-10-CM | POA: Diagnosis not present

## 2016-11-09 DIAGNOSIS — Z23 Encounter for immunization: Secondary | ICD-10-CM

## 2016-11-09 DIAGNOSIS — Z87442 Personal history of urinary calculi: Secondary | ICD-10-CM | POA: Diagnosis not present

## 2016-11-09 DIAGNOSIS — R0902 Hypoxemia: Secondary | ICD-10-CM

## 2016-11-09 DIAGNOSIS — G40909 Epilepsy, unspecified, not intractable, without status epilepticus: Secondary | ICD-10-CM | POA: Diagnosis not present

## 2016-11-09 DIAGNOSIS — R05 Cough: Secondary | ICD-10-CM | POA: Diagnosis not present

## 2016-11-09 DIAGNOSIS — R569 Unspecified convulsions: Secondary | ICD-10-CM | POA: Diagnosis not present

## 2016-11-09 DIAGNOSIS — Z885 Allergy status to narcotic agent status: Secondary | ICD-10-CM | POA: Diagnosis not present

## 2016-11-09 LAB — COMPREHENSIVE METABOLIC PANEL
ALT: 38 U/L (ref 14–54)
AST: 45 U/L — ABNORMAL HIGH (ref 15–41)
Albumin: 3.9 g/dL (ref 3.5–5.0)
Alkaline Phosphatase: 63 U/L (ref 38–126)
Anion gap: 8 (ref 5–15)
BUN: 10 mg/dL (ref 6–20)
CO2: 26 mmol/L (ref 22–32)
Calcium: 8.6 mg/dL — ABNORMAL LOW (ref 8.9–10.3)
Chloride: 103 mmol/L (ref 101–111)
Creatinine, Ser: 0.67 mg/dL (ref 0.44–1.00)
GFR calc Af Amer: >60 mL/min (ref >60)
GFR calc non Af Amer: >60 mL/min (ref >60)
Glucose, Bld: 109 mg/dL — ABNORMAL HIGH (ref 65–99)
Potassium: 3.5 mmol/L (ref 3.5–5.1)
Sodium: 137 mmol/L (ref 135–145)
Total Bilirubin: 0.7 mg/dL (ref 0.3–1.2)
Total Protein: 6.8 g/dL (ref 6.5–8.1)

## 2016-11-09 LAB — URINALYSIS, ROUTINE W REFLEX MICROSCOPIC
Bilirubin Urine: NEGATIVE
Glucose, UA: NEGATIVE mg/dL
Hgb urine dipstick: NEGATIVE
Ketones, ur: NEGATIVE mg/dL
Leukocytes, UA: NEGATIVE
Nitrite: NEGATIVE
Protein, ur: NEGATIVE mg/dL
Specific Gravity, Urine: 1.015 (ref 1.005–1.030)
pH: 7.5 (ref 5.0–8.0)

## 2016-11-09 LAB — CBC WITH DIFFERENTIAL/PLATELET
Basophils Absolute: 0 10*3/uL (ref 0.0–0.1)
Basophils Relative: 0 %
Eosinophils Absolute: 0 10*3/uL (ref 0.0–0.7)
Eosinophils Relative: 0 %
HCT: 31.3 % — ABNORMAL LOW (ref 36.0–46.0)
Hemoglobin: 10.4 g/dL — ABNORMAL LOW (ref 12.0–15.0)
Lymphocytes Relative: 12 %
Lymphs Abs: 1.2 10*3/uL (ref 0.7–4.0)
MCH: 29.5 pg (ref 26.0–34.0)
MCHC: 33.2 g/dL (ref 30.0–36.0)
MCV: 88.7 fL (ref 78.0–100.0)
Monocytes Absolute: 0.9 10*3/uL (ref 0.1–1.0)
Monocytes Relative: 9 %
Neutro Abs: 8.3 10*3/uL — ABNORMAL HIGH (ref 1.7–7.7)
Neutrophils Relative %: 79 %
Platelets: 213 10*3/uL (ref 150–400)
RBC: 3.53 MIL/uL — ABNORMAL LOW (ref 3.87–5.11)
RDW: 13 % (ref 11.5–15.5)
WBC: 10.5 10*3/uL (ref 4.0–10.5)

## 2016-11-09 LAB — CBC
HCT: 26.5 % — ABNORMAL LOW (ref 36.0–46.0)
Hemoglobin: 9.1 g/dL — ABNORMAL LOW (ref 12.0–15.0)
MCH: 30.5 pg (ref 26.0–34.0)
MCHC: 34.3 g/dL (ref 30.0–36.0)
MCV: 88.9 fL (ref 78.0–100.0)
Platelets: 183 10*3/uL (ref 150–400)
RBC: 2.98 MIL/uL — ABNORMAL LOW (ref 3.87–5.11)
RDW: 13.5 % (ref 11.5–15.5)
WBC: 11.6 10*3/uL — ABNORMAL HIGH (ref 4.0–10.5)

## 2016-11-09 LAB — CREATININE, SERUM
Creatinine, Ser: 0.57 mg/dL (ref 0.44–1.00)
GFR calc Af Amer: >60 mL/min (ref >60)
GFR calc non Af Amer: >60 mL/min (ref >60)

## 2016-11-09 LAB — RAPID URINE DRUG SCREEN, HOSP PERFORMED
Amphetamines: NOT DETECTED
Barbiturates: NOT DETECTED
Benzodiazepines: NOT DETECTED
Cocaine: NOT DETECTED
Opiates: POSITIVE — AB
Tetrahydrocannabinol: NOT DETECTED

## 2016-11-09 LAB — I-STAT CG4 LACTIC ACID, ED: Lactic Acid, Venous: 1.27 mmol/L (ref 0.5–1.9)

## 2016-11-09 MED ORDER — ALBUTEROL SULFATE (2.5 MG/3ML) 0.083% IN NEBU: 2.5000 mg | INHALATION_SOLUTION | RESPIRATORY_TRACT | Status: DC | PRN

## 2016-11-09 MED ORDER — INFLUENZA VAC SPLIT QUAD 0.5 ML IM SUSY
0.5000 mL | PREFILLED_SYRINGE | INTRAMUSCULAR | Status: AC
  Administered 2016-11-10: 0.5 mL via INTRAMUSCULAR
  Filled 2016-11-09: qty 0.5

## 2016-11-09 MED ORDER — SODIUM CHLORIDE 0.9 % IV BOLUS (SEPSIS)
1000.0000 mL | Freq: Once | INTRAVENOUS | Status: AC
  Administered 2016-11-09: 1000 mL via INTRAVENOUS

## 2016-11-09 MED ORDER — SODIUM CHLORIDE 0.9 % IV SOLN
INTRAVENOUS | Status: AC
  Administered 2016-11-09 (×2): via INTRAVENOUS

## 2016-11-09 MED ORDER — SODIUM CHLORIDE 0.9 % IV SOLN
1000.0000 mL | INTRAVENOUS | Status: DC
  Administered 2016-11-09: 1000 mL via INTRAVENOUS

## 2016-11-09 MED ORDER — DEXTROSE 5 % IV SOLN
1.0000 g | Freq: Once | INTRAVENOUS | Status: AC
  Administered 2016-11-09: 1 g via INTRAVENOUS
  Filled 2016-11-09: qty 10

## 2016-11-09 MED ORDER — ACETAMINOPHEN 650 MG RE SUPP: 650.0000 mg | Freq: Four times a day (QID) | RECTAL | Status: DC | PRN

## 2016-11-09 MED ORDER — FLUTICASONE PROPIONATE 50 MCG/ACT NA SUSP
2.0000 | Freq: Every day | NASAL | Status: DC
  Administered 2016-11-10 – 2016-11-12 (×3): 2 via NASAL
  Filled 2016-11-09: qty 16

## 2016-11-09 MED ORDER — DEXTROSE 5 % IV SOLN
1.0000 g | INTRAVENOUS | Status: DC
  Administered 2016-11-10 – 2016-11-12 (×3): 1 g via INTRAVENOUS
  Filled 2016-11-09 (×3): qty 10

## 2016-11-09 MED ORDER — DEXTROSE 5 % IV SOLN
500.0000 mg | Freq: Once | INTRAVENOUS | Status: AC
  Administered 2016-11-09: 500 mg via INTRAVENOUS
  Filled 2016-11-09: qty 500

## 2016-11-09 MED ORDER — ONDANSETRON HCL 4 MG/2ML IJ SOLN: 4.0000 mg | Freq: Four times a day (QID) | INTRAMUSCULAR | Status: DC | PRN

## 2016-11-09 MED ORDER — IBUPROFEN 800 MG PO TABS
800.0000 mg | ORAL_TABLET | Freq: Once | ORAL | Status: AC
  Administered 2016-11-09: 800 mg via ORAL
  Filled 2016-11-09: qty 2

## 2016-11-09 MED ORDER — IPRATROPIUM-ALBUTEROL 0.5-2.5 (3) MG/3ML IN SOLN
3.0000 mL | Freq: Four times a day (QID) | RESPIRATORY_TRACT | Status: DC
  Administered 2016-11-09 – 2016-11-10 (×7): 3 mL via RESPIRATORY_TRACT
  Filled 2016-11-09 (×7): qty 3

## 2016-11-09 MED ORDER — LAMOTRIGINE 100 MG PO TABS
100.0000 mg | ORAL_TABLET | Freq: Two times a day (BID) | ORAL | Status: DC
  Administered 2016-11-10 – 2016-11-12 (×5): 100 mg via ORAL
  Filled 2016-11-09 (×5): qty 1

## 2016-11-09 MED ORDER — GABAPENTIN 600 MG PO TABS
600.0000 mg | ORAL_TABLET | Freq: Once | ORAL | Status: AC
  Administered 2016-11-09: 600 mg via ORAL
  Filled 2016-11-09 (×2): qty 1

## 2016-11-09 MED ORDER — ONDANSETRON HCL 4 MG PO TABS: 4.0000 mg | ORAL_TABLET | Freq: Four times a day (QID) | ORAL | Status: DC | PRN

## 2016-11-09 MED ORDER — TRAZODONE HCL 50 MG PO TABS
150.0000 mg | ORAL_TABLET | Freq: Every day | ORAL | Status: DC
  Administered 2016-11-09 – 2016-11-11 (×3): 150 mg via ORAL
  Filled 2016-11-09 (×3): qty 3

## 2016-11-09 MED ORDER — ENOXAPARIN SODIUM 40 MG/0.4ML ~~LOC~~ SOLN
40.0000 mg | SUBCUTANEOUS | Status: DC
  Administered 2016-11-09 – 2016-11-10 (×2): 40 mg via SUBCUTANEOUS
  Filled 2016-11-09 (×2): qty 0.4

## 2016-11-09 MED ORDER — AZITHROMYCIN 500 MG IV SOLR
INTRAVENOUS | Status: AC
  Filled 2016-11-09: qty 500

## 2016-11-09 MED ORDER — LORAZEPAM 2 MG/ML IJ SOLN
1.0000 mg | INTRAMUSCULAR | Status: DC | PRN
  Administered 2016-11-09: 1 mg via INTRAVENOUS
  Administered 2016-11-10 (×2): 2 mg via INTRAVENOUS
  Filled 2016-11-09 (×3): qty 1

## 2016-11-09 MED ORDER — ACETAMINOPHEN 325 MG PO TABS
650.0000 mg | ORAL_TABLET | Freq: Four times a day (QID) | ORAL | Status: DC | PRN
  Administered 2016-11-11: 650 mg via ORAL
  Filled 2016-11-09: qty 2

## 2016-11-09 MED ORDER — SODIUM CHLORIDE 0.9% FLUSH
3.0000 mL | Freq: Two times a day (BID) | INTRAVENOUS | Status: DC
  Administered 2016-11-09 – 2016-11-12 (×6): 3 mL via INTRAVENOUS

## 2016-11-09 MED ORDER — PRAMIPEXOLE DIHYDROCHLORIDE 0.25 MG PO TABS
0.5000 mg | ORAL_TABLET | Freq: Two times a day (BID) | ORAL | Status: DC
  Administered 2016-11-09 – 2016-11-12 (×7): 0.5 mg via ORAL
  Filled 2016-11-09 (×7): qty 2

## 2016-11-09 MED ORDER — LEVETIRACETAM 500 MG PO TABS
1500.0000 mg | ORAL_TABLET | Freq: Two times a day (BID) | ORAL | Status: DC
  Administered 2016-11-09 – 2016-11-12 (×6): 1500 mg via ORAL
  Filled 2016-11-09 (×6): qty 3

## 2016-11-09 MED ORDER — DEXTROSE 5 % IV SOLN
500.0000 mg | INTRAVENOUS | Status: DC
  Filled 2016-11-09: qty 500

## 2016-11-09 MED ORDER — MIRTAZAPINE 15 MG PO TABS
45.0000 mg | ORAL_TABLET | Freq: Every day | ORAL | Status: DC
  Administered 2016-11-09 – 2016-11-11 (×3): 45 mg via ORAL
  Filled 2016-11-09 (×3): qty 3

## 2016-11-09 MED ORDER — GABAPENTIN 300 MG PO CAPS
600.0000 mg | ORAL_CAPSULE | Freq: Three times a day (TID) | ORAL | Status: DC
  Administered 2016-11-10 – 2016-11-12 (×8): 600 mg via ORAL
  Filled 2016-11-09 (×8): qty 2

## 2016-11-09 MED ORDER — HYDRALAZINE HCL 20 MG/ML IJ SOLN: 10.0000 mg | Freq: Three times a day (TID) | INTRAMUSCULAR | Status: DC | PRN

## 2016-11-09 MED ORDER — SODIUM CHLORIDE 0.9 % IV BOLUS (SEPSIS)
250.0000 mL | Freq: Once | INTRAVENOUS | Status: AC
Start: 2016-11-09 — End: 2016-11-09
  Administered 2016-11-09: 250 mL via INTRAVENOUS

## 2016-11-09 MED ORDER — FLUOXETINE HCL 20 MG PO CAPS
60.0000 mg | ORAL_CAPSULE | Freq: Every day | ORAL | Status: DC
  Administered 2016-11-10 – 2016-11-12 (×3): 60 mg via ORAL
  Filled 2016-11-09 (×3): qty 3

## 2016-11-09 NOTE — Plan of Care (Signed)
51 year old female with past medical history significant for seizures, restless leg, kidney stones, chronic kidney disease presented to the emergency room with chief complaint of weakness.  Per PA patient had blood pressure with systolic in the 35W that responded to IV fluid, tachycardia also resolved, 2.5 L of fluid given.  In the chart patient had a fever but has now defervesced.  Chest x-ray showed pneumonia and appropriate antibiotics were given, Rocephin and Zithromax. Patient did have a seizure in the emergency room but has a known history of seizure.  Patient was placed on oxygen after the seizure was over due to hypoxia on monitor. Patient was not hypoxic prior to the seizure.  Patient has reported history of increased seizure activity with illness.  Flu swab pending.  Cultures x2 were drawn.  UA completely negative but urine culture sent.  Initial lactic acid normal.  White blood cell count elevated from baseline with left shift.  Plan: Admit to [facility] for observation and continued hydration  Status: observation telemetry Disposition: Likely discharge home tomorrow after hydration  Elwin Mocha

## 2016-11-09 NOTE — ED Notes (Signed)
RN at bedside hanging medications.  Pt began to get confused, bilateral legs jerking and then went unresponsive.  Episode lasted several seconds. Vitals remained stable.  Pt drowsy and slightly confused after regaining consciousness.  No tongue injury or incontinence.

## 2016-11-09 NOTE — H&P (Addendum)
[facility] History and Physical  [name] WJX:[id number] DOB: Nov 17, 1965 DOA: 11/09/2016  Referring physician:  PCP: [name], MD   Chief Complaint: sob  HPI: [name] is a 51 y.o. female  with past medical history significant for seizures, restless leg, kidney stones, chronic kidney disease presented to the emergency room with chief complaint of weakness.  Patient has had cold symptoms for a few days.  Report is acute development of shortness of breath about 2 days ago.  Saw primary care doctor and was checked for flu and was negative.  Continue to worsen.  Sick contacts include daughter granddaughter.  Patient has had a productive cough with yellow sputum for about 1 day.  Came to the emergency room for further evaluation because she was weak.  ED Course: Per PA patient had blood pressure with systolic in the 21H that responded to IV fluid, tachycardia also resolved, 2.5 L of fluid given.  In the chart patient had a fever but has now defervesced.  Chest x-ray showed pneumonia and appropriate antibiotics were given, Rocephin and Zithromax. Patient did have a seizure in the emergency room but has a known history of seizure.  Patient was placed on oxygen after the seizure was over due to hypoxia on monitor. Patient was not hypoxic prior to the seizure.  Patient has reported history of increased seizure activity with illness.   Review of Systems:  As per HPI otherwise 10 point review of systems negative.    Past Medical History:  Diagnosis Date  . Arthritis    "knees" (04/22/2012)  . Bronchitis   . Chronic bronchitis ([place])    "yearly; when the weather changes" (04/22/2012)  . Chronic kidney disease    kidney stones  . Colon polyps    adenomatous and hyperplastic-  . Constipation   . Depression   . Eczema   . Epilepsy ([place])    "been having them right often here lately" (04/22/2012)  . Fatty liver   . YQMVHQIO(962.9)    "1/wk" (04/22/2012)  . High cholesterol    . History of kidney stones   . Osteoarthritis    [name] 04/22/2012  . Other convulsions 05/21/12   non-epileptic spells  . Rectal bleed    in toilet- bright red  . Restless leg   . Seizures ([place])   . Vertigo    Past Surgical History:  Procedure Laterality Date  . ABDOMINAL HYSTERECTOMY  2001  . BLADDER SUSPENSION    . BUNIONECTOMY Left 2000  . CESAREAN SECTION  1987; 1988  . COLONOSCOPY    . JOINT REPLACEMENT    . MASS EXCISION  10/22/2011   Procedure: EXCISION MASS;  Surgeon: [name], MD;  Location: [facility];  Service: General;  Laterality: Right;  excision right buttock mass  . TOTAL HIP ARTHROPLASTY Left 1993; 1995; 2000  . TOTAL KNEE ARTHROPLASTY Left 05/07/2015   Procedure: TOTAL LEFT KNEE ARTHROPLASTY;  Surgeon: [name], MD;  Location[facility];  Service: Orthopedics;  Laterality: Left;  . TOTAL KNEE ARTHROPLASTY Right 10/01/2015   Procedure: RIGHT TOTAL KNEE ARTHROPLASTY;  Surgeon: [name], MD;  Location: [facility];  Service: Orthopedics;  Laterality: Right;   Social History:  reports that she has never smoked. She has never used smokeless tobacco. She reports that she does not drink alcohol or use drugs.  Allergies  Allergen Reactions  . Acetaminophen Other (See Comments)    Has fatty deposits on liver  . Benadryl [Diphenhydramine Hcl] Other (  See Comments)    hyperactivity and seizures  . Codeine Itching and Rash    seizures  . Dilantin [Phenytoin Sodium Extended] Other (See Comments)    Elevated LFT's  . Melatonin Other (See Comments)    seizures  . Ultram [Tramadol Hcl] Other (See Comments)    Seizures  . Vimpat [Lacosamide] Other (See Comments)    Severe dizziness  . Betadine [Povidone Iodine] Rash  . Latex Rash  . Penicillins Itching and Rash    Has patient had a PCN reaction causing immediate rash, facial/tongue/throat swelling, SOB or lightheadedness with hypotension: No Has patient had a PCN reaction causing severe rash involving mucus  membranes or skin necrosis: No Has patient had a PCN reaction that required hospitalization No Has patient had a PCN reaction occurring within the last 10 years: No If all of the above answers are "NO", then may proceed with Cephalosporin use.  . Sulfa Antibiotics Rash  . Tape Rash    Paper tape please  . Vicodin [Hydrocodone-Acetaminophen] Itching    Family History  Problem Relation Age of Onset  . Cancer Mother   . Depression Father   . Cancer Brother   . Diabetes Brother   . Cancer Maternal Aunt   . Diabetes Maternal Aunt   . Bipolar disorder Son   . Drug abuse Son      Prior to Admission medications   Medication Sig Start Date End Date Taking? Authorizing Provider  clonazePAM (KLONOPIN) 0.5 MG tablet Take 1 tablet (0.5 mg total) by mouth daily as needed for anxiety. 10/20/16  Yes Norman Clay, MD  cyclobenzaprine (FLEXERIL) 10 MG tablet Take 1 tablet (10 mg total) by mouth 3 (three) times daily as needed for muscle spasms. 09/05/16  Yes [name], PA-C  FLUoxetine HCl 60 MG TABS Take 60 mg by mouth daily. 10/20/16  Yes Hisada, Elie Goody, MD  fluticasone (FLONASE) 50 MCG/ACT nasal spray Place 2 sprays into both nostrils daily. 11/07/16  Yes Dettinger, Fransisca Kaufmann, MD  gabapentin (NEURONTIN) 600 MG tablet TAKE 1 TABLET BY MOUTH THREE TIMES DAILY 08/18/16  Yes [name], PA-C  lamoTRIgine (LAMICTAL) 100 MG tablet Take 1 tablet (100 mg total) by mouth 2 (two) times daily. 04/09/16  Yes Dohmeier, Asencion Partridge, MD  levETIRAcetam (KEPPRA) 500 MG tablet Take 3 tablets (1,500 mg total) by mouth 2 (two) times daily. 04/09/16  Yes Dohmeier, Asencion Partridge, MD  loratadine (CLARITIN) 10 MG tablet Take 1 tablet (10 mg total) by mouth daily. 11/07/16  Yes Dettinger, Fransisca Kaufmann, MD  mirtazapine (REMERON) 45 MG tablet Take 1 tablet (45 mg total) by mouth at bedtime. 10/20/16  Yes Hisada, Elie Goody, MD  pramipexole (MIRAPEX) 0.5 MG tablet TAKE ONE TABLET BY MOUTH TWICE DAILY 08/27/16  Yes [name], MD  traZODone  (DESYREL) 150 MG tablet Take 1 tablet (150 mg total) by mouth at bedtime. 05/19/16  Yes [name], PA-C   Physical Exam: Vitals:   11/09/16 1403 11/09/16 1510 11/09/16 1734 11/09/16 1954  BP: (!) 82/52 (!) 85/57 107/71   Pulse: 84 73 86   Resp: 18     Temp: 97.9 F (36.6 C)     TempSrc: Oral     SpO2: 96%   (!) 86%  Weight: 76.9 kg (169 lb 8.5 oz)     Height: 5' (1.524 m)       Wt Readings from Last 3 Encounters:  11/09/16 76.9 kg (169 lb 8.5 oz)  11/07/16 75.2 kg (165 lb 12.8  oz)  09/05/16 71.7 kg (158 lb)    General:  Appears calm and comfortable; a&Ox3 Eyes:  PERRL, EOMI, normal lids, iris ENT:  grossly normal hearing, lips & tongue Neck:  no LAD, masses or thyromegaly Cardiovascular:  Rhonchi diffuse. No LE edema. Nl wob. Respiratory:  CTA bilaterally, no w/r/r. Normal respiratory effort. Abdomen:  soft, ntnd Skin:  no rash or induration seen on limited exam Musculoskeletal:  grossly normal tone BUE/BLE Psychiatric:  grossly normal mood and affect, speech fluent and appropriate Neurologic:  CN 2-12 grossly intact, moves all extremities in coordinated fashion.          Labs on Admission:  Basic Metabolic Panel:  Recent Labs Lab 11/09/16 0935 11/09/16 1524  NA 137  --   K 3.5  --   CL 103  --   CO2 26  --   GLUCOSE 109*  --   BUN 10  --   CREATININE 0.67 0.57  CALCIUM 8.6*  --    Liver Function Tests:  Recent Labs Lab 11/09/16 0935  AST 45*  ALT 38  ALKPHOS 63  BILITOT 0.7  PROT 6.8  ALBUMIN 3.9   No results for input(s): LIPASE, AMYLASE in the last 168 hours. No results for input(s): AMMONIA in the last 168 hours. CBC:  Recent Labs Lab 11/09/16 0935 11/09/16 1524  WBC 10.5 11.6*  NEUTROABS 8.3*  --   HGB 10.4* 9.1*  HCT 31.3* 26.5*  MCV 88.7 88.9  PLT 213 183   Cardiac Enzymes: No results for input(s): CKTOTAL, CKMB, CKMBINDEX, TROPONINI in the last 168 hours.  BNP (last 3 results) No results for input(s): BNP in the last 8760  hours.  ProBNP (last 3 results) No results for input(s): PROBNP in the last 8760 hours.   Serum creatinine: 0.57 mg/dL 11/09/16 1524 Estimated creatinine clearance: 76.3 mL/min  CBG: No results for input(s): GLUCAP in the last 168 hours.  Radiological Exams on Admission: Dg Chest 2 View  Result Date: 11/09/2016 CLINICAL DATA:  Pt reports fever, non-productive cough x 3 days Denies hx heart or lung conditions, nonsmoker Hx chronic bronchitis (per history notes) EXAM: CHEST  2 VIEW COMPARISON:  03/28/2016 FINDINGS: Heart size is normal. There is dense airspace filling opacity within the right lower lobe, compatible with infectious infiltrate. The left lung is clear. No pulmonary edema. IMPRESSION: Right lower lobe infiltrate. Followup PA and lateral chest X-ray is recommended in 3-4 weeks following trial of antibiotic therapy to ensure resolution and exclude underlying malignancy. Electronically Signed   By: Nolon Nations M.D.   On: 11/09/2016 09:26    EKG: Independently reviewed. NSR. No stemi.  Assessment/Plan Active Problems:   CAP (community acquired pneumonia)  Symptoms consistent with pneumonia Started on antibiotics in the emergency room. Scheduled DuoNeb's When necessary albuterol Antibiotic-rocephin and azithro Oxygen therapy Continuous pulse oximetry Blood cult x2 Flu needs to be recollected Strep urine pendind UDS pending No mucincex since pt says it made sx worse  Seizures Seizure precaution PRN Ativan Keppra level  Anxiety Hold Klonopin to the morning Continue fluoxetine, Remeron, trazodone  ROS Continue Mirapex  Seasonal allergies Continue Flonase  Chronic pain Hold gabapentin Hold Lamictal   Code Status: FC  DVT Prophylaxis:  Family Communication: lovenox Disposition Plan: Pending Improvement  Status: obs tele  Elwin Mocha, MD Family Medicine [facility] [email or web address] Password [email or web address]

## 2016-11-09 NOTE — ED Provider Notes (Signed)
[facility] EMERGENCY DEPARTMENT Provider Note   CSN: [id number] Arrival date & time: 11/09/16  5027     History   Chief Complaint Chief Complaint  Patient presents with  . Cough    HPI [name] is a 51 y.o. female.  HPI [name] is a 50 y.o. female presents to ED with URI. States several days of sore throat, congestion, cough. Started having fever up to 103 two days ago. Chills since yesterday. States cough is dry, non productive. No chest pain. Reports some SOB with coughing. Has tried ibuprofen with some relief. Denies abdominal pain, nausea, vomiting, diarrhea.  Patient went to PCP 2 days ago, states was tested for strep and influenza, both negative.  She was not given any medications.  Patient states her symptoms are similar to when she had pneumonia a few years ago. No treatment prior to coming in.  Past Medical History:  Diagnosis Date  . Arthritis    "knees" (04/22/2012)  . Bronchitis   . Chronic bronchitis ([place])    "yearly; when the weather changes" (04/22/2012)  . Chronic kidney disease    kidney stones  . Colon polyps    adenomatous and hyperplastic-  . Constipation   . Depression   . Eczema   . Epilepsy ([place])    "been having them right often here lately" (04/22/2012)  . Fatty liver   . XAJOINOM(767.2)    "1/wk" (04/22/2012)  . High cholesterol   . History of kidney stones   . Osteoarthritis    [name] 04/22/2012  . Other convulsions 05/21/12   non-epileptic spells  . Rectal bleed    in toilet- bright red  . Restless leg   . Seizures ([place])   . Vertigo     Patient Active Problem List   Diagnosis Date Noted  . 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 Moderate episode of recurrent major depressive disorder ([place]) 12/25/2015  . Bereavement 12/25/2015  . HCAP (healthcare-associated pneumonia) 11/28/2015  . Multifocal pneumonia 11/28/2015  . Partial symptomatic epilepsy with complex partial seizures, not intractable, with status epilepticus  ([place]) 07/24/2015  . Insomnia w/ sleep apnea 07/24/2015  . OA (osteoarthritis) of knee 05/07/2015  . Memory loss 02/20/2014  . Hypokalemia 06/23/2011  . Moderate major depression ([place]) 10/14/2010    Past Surgical History:  Procedure Laterality Date  . ABDOMINAL HYSTERECTOMY  2001  . BLADDER SUSPENSION    . BUNIONECTOMY Left 2000  . CESAREAN SECTION  1987; 1988  . COLONOSCOPY    . JOINT REPLACEMENT    . MASS EXCISION  10/22/2011   Procedure: EXCISION MASS;  Surgeon: [name], MD;  Location: [facility];  Service: General;  Laterality: Right;  excision right buttock mass  . TOTAL HIP ARTHROPLASTY Left 1993; 1995; 2000  . TOTAL KNEE ARTHROPLASTY Left 05/07/2015   Procedure: TOTAL LEFT KNEE ARTHROPLASTY;  Surgeon: [name], MD;  [facility];  Service: Orthopedics;  Laterality: Left;  . TOTAL KNEE ARTHROPLASTY Right 10/01/2015   Procedure: RIGHT TOTAL KNEE ARTHROPLASTY;  Surgeon: [name], MD;  [facility];  Service: Orthopedics;  Laterality: Right;    OB History    No data available       Home Medications    Prior to Admission medications   Medication Sig Start Date End Date Taking? Authorizing Provider  clonazePAM (KLONOPIN) 0.5 MG tablet Take 1 tablet (0.5 mg total) by mouth daily as needed for anxiety. 10/20/16   Norman Clay, MD  cyclobenzaprine (  FLEXERIL) 10 MG tablet Take 1 tablet (10 mg total) by mouth 3 (three) times daily as needed for muscle spasms. Patient not taking: Reported on 09/23/2016 09/05/16   [name], PA-C  FLUoxetine HCl 60 MG TABS Take 60 mg by mouth daily. 10/20/16   Norman Clay, MD  fluticasone (FLONASE) 50 MCG/ACT nasal spray Place 2 sprays into both nostrils daily. 11/07/16   Dettinger, Fransisca Kaufmann, MD  gabapentin (NEURONTIN) 600 MG tablet TAKE 1 TABLET BY MOUTH THREE TIMES DAILY 08/18/16   [name], PA-C  lamoTRIgine (LAMICTAL) 100 MG tablet Take 1 tablet (100 mg total) by mouth 2 (two) times daily. 04/09/16   Dohmeier, Asencion Partridge,  MD  levETIRAcetam (KEPPRA) 500 MG tablet Take 3 tablets (1,500 mg total) by mouth 2 (two) times daily. 04/09/16   Dohmeier, Asencion Partridge, MD  loratadine (CLARITIN) 10 MG tablet Take 1 tablet (10 mg total) by mouth daily. 11/07/16   Dettinger, Fransisca Kaufmann, MD  mirtazapine (REMERON) 45 MG tablet Take 1 tablet (45 mg total) by mouth at bedtime. 10/20/16   Norman Clay, MD  Multiple Vitamin (MULTIVITAMIN WITH MINERALS) TABS tablet Take 1 tablet by mouth daily.    [provider]  pramipexole (MIRAPEX) 0.5 MG tablet TAKE ONE TABLET BY MOUTH TWICE DAILY 08/27/16   [name], MD  traZODone (DESYREL) 150 MG tablet Take 1 tablet (150 mg total) by mouth at bedtime. 05/19/16   [name], PA-C    Family History Family History  Problem Relation Age of Onset  . Cancer Mother   . Depression Father   . Cancer Brother   . Diabetes Brother   . Cancer Maternal Aunt   . Diabetes Maternal Aunt   . Bipolar disorder Son   . Drug abuse Son     Social History Social History  Substance Use Topics  . Smoking status: Never Smoker  . Smokeless tobacco: Never Used  . Alcohol use No     Comment: 12-25-2015 per pt no     Allergies   Acetaminophen; Benadryl [diphenhydramine hcl]; Codeine; Dilantin [phenytoin sodium extended]; Melatonin; Ultram [tramadol hcl]; Vimpat [lacosamide]; Betadine [povidone iodine]; Latex; Penicillins; Sulfa antibiotics; Tape; and Vicodin [hydrocodone-acetaminophen]   Review of Systems Review of Systems  Constitutional: Positive for chills and fever.  HENT: Positive for congestion and sore throat.   Respiratory: Positive for cough, chest tightness and shortness of breath.   Cardiovascular: Negative for chest pain, palpitations and leg swelling.  Gastrointestinal: Negative for abdominal pain, diarrhea, nausea and vomiting.  Genitourinary: Negative for dysuria, flank pain and pelvic pain.  Musculoskeletal: Negative for arthralgias, myalgias, neck pain and neck stiffness.    Skin: Negative for rash.  Neurological: Negative for dizziness, weakness and headaches.  All other systems reviewed and are negative.    Physical Exam Updated Vital Signs BP 96/68 (BP Location: Left Arm) Comment: measured x2  Pulse (!) 112   Temp (!) 101.8 F (38.8 C) (Oral)   Resp 20   Ht 5' (1.524 m)   Wt 74.8 kg (165 lb)   SpO2 95%   BMI 32.22 kg/m   Physical Exam  Constitutional: She is oriented to person, place, and time. She appears well-developed and well-nourished.  Appears weak  HENT:  Head: Normocephalic.  Eyes: Conjunctivae are normal.  Neck: Neck supple.  Cardiovascular: Normal rate, regular rhythm and normal heart sounds.   Pulmonary/Chest: Effort normal. No respiratory distress. She has no wheezes. She has rales.  Rales at right base  Abdominal:  Soft. Bowel sounds are normal. She exhibits no distension. There is no tenderness. There is no rebound.  Musculoskeletal: She exhibits no edema.  Neurological: She is alert and oriented to person, place, and time.  Skin: Skin is warm and dry.  Psychiatric: She has a normal mood and affect. Her behavior is normal.  Nursing note and vitals reviewed.    ED Treatments / Results  Labs (all labs ordered are listed, but only abnormal results are displayed) Labs Reviewed  COMPREHENSIVE METABOLIC PANEL - Abnormal; Notable for the following:       Result Value   Glucose, Bld 109 (*)    Calcium 8.6 (*)    AST 45 (*)    All other components within normal limits  CBC WITH DIFFERENTIAL/PLATELET - Abnormal; Notable for the following:    RBC 3.53 (*)    Hemoglobin 10.4 (*)    HCT 31.3 (*)    Neutro Abs 8.3 (*)    All other components within normal limits  CULTURE, BLOOD (ROUTINE X 2)  CULTURE, BLOOD (ROUTINE X 2)  URINE CULTURE  URINALYSIS, ROUTINE W REFLEX MICROSCOPIC  INFLUENZA PANEL BY PCR (TYPE A & B)  I-STAT CG4 LACTIC ACID, ED    EKG  EKG Interpretation  Date/Time:  Sunday November 09 2016 10:05:24  EDT Ventricular Rate:  96 PR Interval:    QRS Duration: 103 QT Interval:  381 QTC Calculation: 482 R Axis:   57 Text Interpretation:  Sinus rhythm Borderline short PR interval Borderline T abnormalities, diffuse leads Confirmed by Charlesetta Shanks [phone]) on 11/09/2016 10:46:41 AM       Radiology Dg Chest 2 View  Result Date: 11/09/2016 CLINICAL DATA:  Pt reports fever, non-productive cough x 3 days Denies hx heart or lung conditions, nonsmoker Hx chronic bronchitis (per history notes) EXAM: CHEST  2 VIEW COMPARISON:  03/28/2016 FINDINGS: Heart size is normal. There is dense airspace filling opacity within the right lower lobe, compatible with infectious infiltrate. The left lung is clear. No pulmonary edema. IMPRESSION: Right lower lobe infiltrate. Followup PA and lateral chest X-ray is recommended in 3-4 weeks following trial of antibiotic therapy to ensure resolution and exclude underlying malignancy. Electronically Signed   By: Nolon Nations M.D.   On: 11/09/2016 09:26    Procedures Procedures (including critical care time)  CRITICAL CARE Performed by: Jeannett Senior A Total critical care time: 30 minutes Critical care time was exclusive of separately billable procedures and treating other patients. Critical care was necessary to treat or prevent imminent or life-threatening deterioration. Critical care was time spent personally by me on the following activities: development of treatment plan with patient and/or surrogate as well as nursing, discussions with consultants, evaluation of patient's response to treatment, examination of patient, obtaining history from patient or surrogate, ordering and performing treatments and interventions, ordering and review of laboratory studies, ordering and review of radiographic studies, pulse oximetry and re-evaluation of patient's condition.  Medications Ordered in ED Medications  ibuprofen (ADVIL,MOTRIN) tablet 800 mg (800 mg Oral  Given 11/09/16 0906)     Initial Impression / Assessment and Plan / ED Course  I have reviewed the triage vital signs and the nursing notes.  Pertinent labs & imaging results that were available during my care of the patient were reviewed by me and considered in my medical decision making (see chart for details).     9:32 AM Patient seen and examined.  Patient with fever, chills, URI symptoms.  Tested for influenza 2  days ago and states it was negative.  Also tested for strep which was negative.  Patient is tachycardic, febrile, blood pressure is 96/68.  Rales on bases on the right.  Pt meets sepsis criteria. Code sepsis called.  Antibiotics and IV fluids started.  10:21 AM I was called to the room because patient was having a seizure.  Patient had few seconds of jerking and unresponsiveness.  Patient is now awake, appears to be drowsy, able to respond.  She is oriented to self and place.  Patient does have history of seizures, takes Keppra and Lamictal.  She states she took her morning medications.  Patient states she has had 2 seizures in the week, and states her seizures are stress-induced.  We will continue IV fluids, antibiotics, labs pending.  With most likely admit.  10:46 AM Sepsis - Repeat Assessment  Performed at:    10:46 AM   Vitals     Blood pressure 105/70, pulse 96, temperature 98.8 F (37.1 C), temperature source Oral, resp. rate 16, height 5' (1.524 m), weight 74.8 kg (165 lb), SpO2 98 %.  Heart:     Regular rate and rhythm  Lungs:    Rales  Capillary Refill:   <2 sec  Peripheral Pulse:   Radial pulse palpable  Skin:     Normal Color   11:18 AM Patient received all of her fluids.  Her heart rate improved, in 90s, still continues to be 28-366 systolic BP. Nurse just reported that pt is hypoxic, placed on 2L [place]. Potentially from seizure, however, in setting of pneumonia, could be due to infection. I called and spoke with hospitalist for admission. Was told to give  breathing treatment for hypoxia. Pt not wheezing on exam. No hx of asthma or inhaler use in the past. Breathing tx ordered as asked. Also asked for keppra level, normally not something we order in ED. Pt accepted to [facility] for observation.   Vitals:   11/09/16 0856 11/09/16 0858 11/09/16 1007 11/09/16 1019  BP: 96/68  98/63 105/70  Pulse: (!) 112  97 96  Resp: 20  (!) 22 16  Temp: (!) 101.8 F (38.8 C)   98.8 F (37.1 C)  TempSrc: Oral   Oral  SpO2: 95%  (!) 86% 98%  Weight:  74.8 kg (165 lb)    Height:  5' (1.524 m)       Final Clinical Impressions(s) / ED Diagnoses   Final diagnoses:  Community acquired pneumonia of right lower lobe of lung ([place])  Seizure ([place])  Hypoxia    New Prescriptions New Prescriptions   No medications on file     Jeannett Senior, PA-C 11/09/16 [place], [place], MD 11/12/16 2305

## 2016-11-09 NOTE — ED Notes (Signed)
ED Provider at bedside. 

## 2016-11-09 NOTE — ED Notes (Signed)
Pt on cardiac monitor and auto VS q1hr

## 2016-11-09 NOTE — Progress Notes (Signed)
Pt arrived w/ BP of 82/52 at recheck, BP 85/57  pt is asymptomatic.1011ml Bolus ordered. Will recheck when administered, and continue to monitor closely

## 2016-11-09 NOTE — Progress Notes (Addendum)
Spoke with Dr Aggie Moats re PICC order.  Pt  And family apprehensive re PICC placement.  SBP still <90 at time of phone call.  Has 2 PIV functioning well at this time and sufficient for meds and fluids currently ordered.  MD  Note states likely d/c home tomorrow after hydration.  Dr Aggie Moats voiced concern re BP not responding and potential need for more access due to potential IV meds needed.  Will hold off on PICC placement at this time per Dr Aggie Moats and pt wishes. Sallie RN notified of current plan.

## 2016-11-09 NOTE — Progress Notes (Signed)
Pharmacy Antibiotic Note  [name] is a 51 y.o. female admitted on 11/09/2016 with sepsis likely due to pneumonia.  Pharmacy has been consulted for azithromycin and ceftriaxone dosing. Her WBCs are 10.5, lactic acid elevated at 1.27, and she is febrile.  She does have a penicillin allergy, however her medication history shows that she has tolerated cephalosporins in the past.   Plan: Ceftriaxone 1g Q24h Azithromycin 500mg  Q24h Monitor clinical status, Cx's, LOT  Height: 5' (152.4 cm) Weight: 165 lb (74.8 kg) IBW/kg (Calculated) : 45.5  Temp (24hrs), Avg:101.8 F (38.8 C), Min:101.8 F (38.8 C), Max:101.8 F (38.8 C)   Recent Labs Lab 11/09/16 0942  LATICACIDVEN 1.27    CrCl cannot be calculated (Patient's most recent lab result is older than the maximum 21 days allowed.).    Allergies  Allergen Reactions  . Acetaminophen Other (See Comments)    Has fatty deposits on liver  . Benadryl [Diphenhydramine Hcl] Other (See Comments)    hyperactivity and seizures  . Codeine Itching and Rash    seizures  . Dilantin [Phenytoin Sodium Extended] Other (See Comments)    Elevated LFT's  . Melatonin Other (See Comments)    seizures  . Ultram [Tramadol Hcl] Other (See Comments)    Seizures  . Vimpat [Lacosamide] Other (See Comments)    Severe dizziness  . Betadine [Povidone Iodine] Rash  . Latex Rash  . Penicillins Itching and Rash    Has patient had a PCN reaction causing immediate rash, facial/tongue/throat swelling, SOB or lightheadedness with hypotension: No Has patient had a PCN reaction causing severe rash involving mucus membranes or skin necrosis: No Has patient had a PCN reaction that required hospitalization No Has patient had a PCN reaction occurring within the last 10 years: No If all of the above answers are "NO", then may proceed with Cephalosporin use.  . Sulfa Antibiotics Rash  . Tape Rash    Paper tape please  . Vicodin [Hydrocodone-Acetaminophen]  Itching    Thank you for allowing pharmacy to be a part of this patient's care.   Patterson Hammersmith PharmD PGY1 Pharmacy Practice Resident 11/09/2016 10:05 AM Pager: [phone]

## 2016-11-09 NOTE — ED Provider Notes (Signed)
Medical screening examination/treatment/procedure(s) were conducted as a shared visit with non-physician practitioner(s) and myself.  I personally evaluated the patient during the encounter.   EKG Interpretation  Date/Time:  Sunday November 09 2016 10:05:24 EDT Ventricular Rate:  96 PR Interval:    QRS Duration: 103 QT Interval:  381 QTC Calculation: 482 R Axis:   57 Text Interpretation:  Sinus rhythm Borderline short PR interval Borderline T abnormalities, diffuse leads Confirmed by Charlesetta Shanks [phone]) on 11/09/2016 10:46:41 AM     Developed cough and chest congestion for about 3 days.  She reports she gets pneumonia about this time every year and ends up hospitalized.  Fever first developed today. Patient is alert but ill in appearance.  No respiratory distress at rest.  Because membranes are moist.  Heart regular without gross rub murmur gallop.  Crackles bilateral lung bases.  Abdomen soft nontender.  No peripheral edema or calf tenderness.  Skin condition of lower extremities very good. Patient denies any cardiac history.  Chest x-ray confirms pneumonia.  Patient is febrile and hypotensive with hypoxia on room air.  Sepsis protocol initiated.  Agree with plan and management.   Charlesetta Shanks, MD 11/09/16 1047

## 2016-11-09 NOTE — Progress Notes (Signed)
1734: BP:107/71

## 2016-11-09 NOTE — ED Triage Notes (Signed)
Pt reports fever, non-productive cough x 3 days. Was seen at her pcp Friday.

## 2016-11-10 ENCOUNTER — Encounter (HOSPITAL_COMMUNITY): Payer: Self-pay

## 2016-11-10 DIAGNOSIS — Z91048 Other nonmedicinal substance allergy status: Secondary | ICD-10-CM | POA: Diagnosis not present

## 2016-11-10 DIAGNOSIS — Z888 Allergy status to other drugs, medicaments and biological substances status: Secondary | ICD-10-CM | POA: Diagnosis not present

## 2016-11-10 DIAGNOSIS — Z90710 Acquired absence of both cervix and uterus: Secondary | ICD-10-CM | POA: Diagnosis not present

## 2016-11-10 DIAGNOSIS — J181 Lobar pneumonia, unspecified organism: Secondary | ICD-10-CM | POA: Diagnosis present

## 2016-11-10 DIAGNOSIS — Z886 Allergy status to analgesic agent status: Secondary | ICD-10-CM | POA: Diagnosis not present

## 2016-11-10 DIAGNOSIS — G40909 Epilepsy, unspecified, not intractable, without status epilepticus: Secondary | ICD-10-CM | POA: Diagnosis present

## 2016-11-10 DIAGNOSIS — Z885 Allergy status to narcotic agent status: Secondary | ICD-10-CM | POA: Diagnosis not present

## 2016-11-10 DIAGNOSIS — F329 Major depressive disorder, single episode, unspecified: Secondary | ICD-10-CM | POA: Diagnosis present

## 2016-11-10 DIAGNOSIS — Z818 Family history of other mental and behavioral disorders: Secondary | ICD-10-CM | POA: Diagnosis not present

## 2016-11-10 DIAGNOSIS — R0902 Hypoxemia: Secondary | ICD-10-CM | POA: Diagnosis present

## 2016-11-10 DIAGNOSIS — Z91040 Latex allergy status: Secondary | ICD-10-CM | POA: Diagnosis not present

## 2016-11-10 DIAGNOSIS — Z96653 Presence of artificial knee joint, bilateral: Secondary | ICD-10-CM | POA: Diagnosis present

## 2016-11-10 DIAGNOSIS — F419 Anxiety disorder, unspecified: Secondary | ICD-10-CM | POA: Diagnosis present

## 2016-11-10 DIAGNOSIS — J189 Pneumonia, unspecified organism: Secondary | ICD-10-CM | POA: Diagnosis present

## 2016-11-10 DIAGNOSIS — Z882 Allergy status to sulfonamides status: Secondary | ICD-10-CM | POA: Diagnosis not present

## 2016-11-10 DIAGNOSIS — R569 Unspecified convulsions: Secondary | ICD-10-CM | POA: Diagnosis not present

## 2016-11-10 DIAGNOSIS — Z87442 Personal history of urinary calculi: Secondary | ICD-10-CM | POA: Diagnosis not present

## 2016-11-10 DIAGNOSIS — Z23 Encounter for immunization: Secondary | ICD-10-CM | POA: Diagnosis present

## 2016-11-10 DIAGNOSIS — G2581 Restless legs syndrome: Secondary | ICD-10-CM | POA: Diagnosis present

## 2016-11-10 DIAGNOSIS — D638 Anemia in other chronic diseases classified elsewhere: Secondary | ICD-10-CM | POA: Diagnosis present

## 2016-11-10 DIAGNOSIS — Z880 Allergy status to penicillin: Secondary | ICD-10-CM | POA: Diagnosis not present

## 2016-11-10 LAB — CBC
HCT: 27.9 % — ABNORMAL LOW (ref 36.0–46.0)
Hemoglobin: 9.2 g/dL — ABNORMAL LOW (ref 12.0–15.0)
MCH: 29.3 pg (ref 26.0–34.0)
MCHC: 33 g/dL (ref 30.0–36.0)
MCV: 88.9 fL (ref 78.0–100.0)
Platelets: 198 10*3/uL (ref 150–400)
RBC: 3.14 MIL/uL — ABNORMAL LOW (ref 3.87–5.11)
RDW: 14 % (ref 11.5–15.5)
WBC: 13.2 10*3/uL — ABNORMAL HIGH (ref 4.0–10.5)

## 2016-11-10 LAB — BASIC METABOLIC PANEL
Anion gap: 6 (ref 5–15)
BUN: 5 mg/dL — ABNORMAL LOW (ref 6–20)
CO2: 24 mmol/L (ref 22–32)
Calcium: 7.4 mg/dL — ABNORMAL LOW (ref 8.9–10.3)
Chloride: 112 mmol/L — ABNORMAL HIGH (ref 101–111)
Creatinine, Ser: 0.54 mg/dL (ref 0.44–1.00)
GFR calc Af Amer: >60 mL/min (ref >60)
GFR calc non Af Amer: >60 mL/min (ref >60)
Glucose, Bld: 92 mg/dL (ref 65–99)
Potassium: 3.6 mmol/L (ref 3.5–5.1)
Sodium: 142 mmol/L (ref 135–145)

## 2016-11-10 LAB — URINE CULTURE

## 2016-11-10 MED ORDER — DEXTROSE 5 % IV SOLN
500.0000 mg | INTRAVENOUS | Status: DC
  Administered 2016-11-10 – 2016-11-11 (×2): 500 mg via INTRAVENOUS
  Filled 2016-11-10 (×4): qty 500

## 2016-11-10 MED ORDER — CYCLOBENZAPRINE HCL 10 MG PO TABS
10.0000 mg | ORAL_TABLET | Freq: Three times a day (TID) | ORAL | Status: DC | PRN
  Administered 2016-11-10 – 2016-11-12 (×4): 10 mg via ORAL
  Filled 2016-11-10 (×4): qty 1

## 2016-11-10 NOTE — Progress Notes (Addendum)
Patient ID: [name], female   DOB: 09-21-1965, 51 y.o.   MRN: [id number]  PROGRESS NOTE    [name]  XIP:[id number] DOB: [birth year] DOA: 11/09/2016  PCP: [name], MD   Brief Narrative:  51 year old female with history of seizures, depression who presented to ED with weakness, cold symptoms for 2 days. She saw PCP and was checked for flu which was negative. She has had productive cough with yellow sputum for past 1 day PTA.  CXR showed pneumonia, she was started azithromycin and rocephin. She had a mild seizure in ED which self resolved.  Assessment & Plan:   Active Problems: Lobar pneumonia, unspecified organism / Leukocytosis  - CXR showed right lower lobe infiltrate. Repeat CXR in 3-4 weeks to ensure resolution - Continue azithromycin and rocephin  - Blood cx pending - Strep pneumonia and influenza is pending  - Continue oxygen support via [place] to keep O2 sats above 90%  Anxiety and depression - Continue Prozac   Seizure disorder - Continue gabapentin, Lamictal, keppra   Anemia of chronic disease - Hgb stable   DVT prophylaxis: Lovenox subQ Code Status: full code  Family Communication: no family at the bedside  Disposition Plan: home once pneumonia    Consultants:   None   Procedures:   None   Antimicrobials:   Azithromycin and Rocephin 10/28  -->   Subjective: No overnight events.   Objective: Vitals:   11/10/16 0131 11/10/16 0453 11/10/16 0456 11/10/16 0759  BP:  111/82    Pulse:  86    Resp:  18    Temp:  98.2 F (36.8 C)    TempSrc:  Oral    SpO2: (!) 84% 97%  96%  Weight:   78.9 kg (173 lb 15.1 oz)   Height:        Intake/Output Summary (Last 24 hours) at 11/10/16 1301 Last data filed at 11/10/16 0600  Gross per 24 hour  Intake           2437.5 ml  Output                0 ml  Net           2437.5 ml   Filed Weights   11/09/16 0858 11/09/16 1403 11/10/16 0456  Weight: 74.8 kg (165 lb) 76.9 kg (169 lb 8.5 oz) 78.9 kg  (173 lb 15.1 oz)    Examination:  General exam: Appears calm and comfortable  Respiratory system: diminished breath sounds, mild wheezing in upper lung lobes  Cardiovascular system: S1 & S2 heard, Rate controlled  Gastrointestinal system: Abdomen is nondistended, soft and nontender. No organomegaly or masses felt. Normal bowel sounds heard. Central nervous system: Alert and oriented. No focal neurological deficits. Extremities: Symmetric 5 x 5 power. Trace LE pitting edema Skin: No rashes, lesions or ulcers Psychiatry: Judgement and insight appear normal. Mood & affect appropriate.   Data Reviewed: I have personally reviewed following labs and imaging studies  CBC:  Recent Labs Lab 11/09/16 0935 11/09/16 1524 11/10/16 0531  WBC 10.5 11.6* 13.2*  NEUTROABS 8.3*  --   --   HGB 10.4* 9.1* 9.2*  HCT 31.3* 26.5* 27.9*  MCV 88.7 88.9 88.9  PLT 213 183 673   Basic Metabolic Panel:  Recent Labs Lab 11/09/16 0935 11/09/16 1524 11/10/16 0531  NA 137  --  142  K 3.5  --  3.6  CL 103  --  112*  CO2 26  --  24  GLUCOSE 109*  --  92  BUN 10  --  5*  CREATININE 0.67 0.57 0.54  CALCIUM 8.6*  --  7.4*   GFR: Estimated Creatinine Clearance: 77.4 mL/min (by C-G formula based on SCr of 0.54 mg/dL). Liver Function Tests:  Recent Labs Lab 11/09/16 0935  AST 45*  ALT 38  ALKPHOS 63  BILITOT 0.7  PROT 6.8  ALBUMIN 3.9   No results for input(s): LIPASE, AMYLASE in the last 168 hours. No results for input(s): AMMONIA in the last 168 hours. Coagulation Profile: No results for input(s): INR, PROTIME in the last 168 hours. Cardiac Enzymes: No results for input(s): CKTOTAL, CKMB, CKMBINDEX, TROPONINI in the last 168 hours. BNP (last 3 results) No results for input(s): PROBNP in the last 8760 hours. HbA1C: No results for input(s): HGBA1C in the last 72 hours. CBG: No results for input(s): GLUCAP in the last 168 hours. Lipid Profile: No results for input(s): CHOL, HDL,  LDLCALC, TRIG, CHOLHDL, LDLDIRECT in the last 72 hours. Thyroid Function Tests: No results for input(s): TSH, T4TOTAL, FREET4, T3FREE, THYROIDAB in the last 72 hours. Anemia Panel: No results for input(s): VITAMINB12, FOLATE, FERRITIN, TIBC, IRON, RETICCTPCT in the last 72 hours. Urine analysis:    Component Value Date/Time   COLORURINE YELLOW 11/09/2016 0952   APPEARANCEUR CLEAR 11/09/2016 0952   APPEARANCEUR Clear 04/09/2015 1101   LABSPEC 1.015 11/09/2016 0952   PHURINE 7.5 11/09/2016 [place] 11/09/2016 0952   HGBUR NEGATIVE 11/09/2016 0952   BILIRUBINUR NEGATIVE 11/09/2016 0952   BILIRUBINUR Negative 04/09/2015 [place] 11/09/2016 0952   PROTEINUR NEGATIVE 11/09/2016 0952   UROBILINOGEN negative 04/19/2014 1332   UROBILINOGEN 0.2 12/04/2013 1414   NITRITE NEGATIVE 11/09/2016 0952   LEUKOCYTESUR NEGATIVE 11/09/2016 0952   LEUKOCYTESUR Negative 04/09/2015 1101   Sepsis Labs: @LABRCNTIP (procalcitonin:4,lacticidven:4)    Recent Results (from the past 240 hour(s))  Urine culture     Status: None (Preliminary result)   Collection Time: 11/09/16  9:52 AM  Result Value Ref Range Status   Specimen Description URINE, RANDOM  Final   Special Requests NONE  Final   Culture   Final    CULTURE REINCUBATED FOR BETTER GROWTH Performed at [facility], [place] [address], [place], [place] [address]    Report Status PENDING  Incomplete      Radiology Studies: Dg Chest 2 View Result Date: 11/09/2016 Right lower lobe infiltrate. Followup PA and lateral chest X-ray is recommended in 3-4 weeks following trial of antibiotic therapy to ensure resolution and exclude underlying malignancy.    Scheduled Meds: . enoxaparin   40 mg Subcutaneous Q24H  . FLUoxetine  60 mg Oral Daily  . fluticasone  2 spray Each Nare Daily  . gabapentin  600 mg Oral TID  . ipratropium-albutero  3 mL Nebulization Q6H  . lamoTRIgine  100 mg Oral BID  . levETIRAcetam   1,500 mg Oral BID  . mirtazapine  45 mg Oral QHS  . pramipexole  0.5 mg Oral BID  . traZODone  150 mg Oral QHS   Continuous Infusions: . azithromycin    . cefTRIAXone (ROCEPHIN)  IV       LOS: 0 days    Time spent: 25 minutes  Greater than 50% of the time spent on counseling and coordinating the care.   Leisa Lenz, MD [facility] Pager [phone]  If 7PM-7AM, please contact night-coverage [email or web address] Password [email or web address] 11/10/2016, 1:01 PM

## 2016-11-10 NOTE — Progress Notes (Signed)
PHARMACY NOTE -  Ceftriaxone/Azithromycin  Pharmacy has been assisting with dosing of Ceftriaxone and Azithromycin for CAP. Dosage remains stable with Ceftriaxone 1g q24h and Azithromycin 500 mg IV q24h and need for further dosage adjustment appears unlikely at present.    Will sign off at this time.  Please reconsult if a change in clinical status warrants re-evaluation of dosage.  Hershal Coria, PharmD, BCPS Pager: [phone] 11/10/2016 12:11 PM

## 2016-11-10 NOTE — Progress Notes (Signed)
Nutrition Brief Note  Patient identified on the Malnutrition Screening Tool (MST) Report  Wt Readings from Last 15 Encounters:  11/10/16 173 lb 15.1 oz (78.9 kg)  11/07/16 165 lb 12.8 oz (75.2 kg)  09/05/16 158 lb (71.7 kg)  07/07/16 158 lb 6.4 oz (71.8 kg)  05/19/16 156 lb 3.2 oz (70.9 kg)  05/09/16 159 lb (72.1 kg)  04/09/16 156 lb (70.8 kg)  03/28/16 155 lb (70.3 kg)  12/19/15 164 lb 9.6 oz (74.7 kg)  12/11/15 162 lb (73.5 kg)  11/30/15 163 lb 9.3 oz (74.2 kg)  11/26/15 164 lb 3.2 oz (74.5 kg)  11/21/15 163 lb 9.6 oz (74.2 kg)  10/22/15 162 lb (73.5 kg)  10/01/15 174 lb (78.9 kg)    Body mass index is 33.97 kg/m. Patient meets criteria for obese based on current BMI. Pt reports losing weight 3 months ago, but has since gained back to her normal body weight of 175 lb.   Current diet order is regular. Pt reports having short period of decrease in appetite related to respiratory issues that has since resolved. Pt ate a fruit cup for breakfast without any complication this morning. States she is eager to eat lunch as she is not a breakfast person.  Labs and medications reviewed.   No nutrition interventions warranted at this time. If nutrition issues arise, please consult RD.   Mariana Single RD, LDN Clinical Nutrition Pager # [phone]

## 2016-11-10 NOTE — Care Management Obs Status (Signed)
[place] NOTIFICATION   Patient Details  Name: [name] PROCIDA MRN: [id number] Date of Birth: 05/16/1965   Medicare Observation Status Notification Given:       Dessa Phi, RN 11/10/2016, 3:17 PM

## 2016-11-10 NOTE — Care Management Note (Signed)
Case Management Note  Patient Details  Name: [name] MRN: [id number] Date of Birth: August 16, 1965  Subjective/Objective: 51 y/o f admitted w/PNA. From home.                   Action/Plan:d/c plan home.   Expected Discharge Date:                  Expected Discharge Plan:  Home/Self Care  In-House Referral:     Discharge planning Services  CM Consult  Post Acute Care Choice:    Choice offered to:     DME Arranged:    DME Agency:     HH Arranged:    HH Agency:     Status of Service:  In process, will continue to follow  If discussed at Long Length of Stay Meetings, dates discussed:    Additional Comments:  Dessa Phi, RN 11/10/2016, 12:15 PM

## 2016-11-11 ENCOUNTER — Encounter (HOSPITAL_COMMUNITY): Payer: Self-pay

## 2016-11-11 LAB — CBC
HCT: 30.5 % — ABNORMAL LOW (ref 36.0–46.0)
Hemoglobin: 10.1 g/dL — ABNORMAL LOW (ref 12.0–15.0)
MCH: 29.2 pg (ref 26.0–34.0)
MCHC: 33.1 g/dL (ref 30.0–36.0)
MCV: 88.2 fL (ref 78.0–100.0)
Platelets: 225 10*3/uL (ref 150–400)
RBC: 3.46 MIL/uL — ABNORMAL LOW (ref 3.87–5.11)
RDW: 13.7 % (ref 11.5–15.5)
WBC: 11.1 10*3/uL — ABNORMAL HIGH (ref 4.0–10.5)

## 2016-11-11 LAB — BASIC METABOLIC PANEL
Anion gap: 9 (ref 5–15)
BUN: 6 mg/dL (ref 6–20)
CO2: 25 mmol/L (ref 22–32)
Calcium: 8.7 mg/dL — ABNORMAL LOW (ref 8.9–10.3)
Chloride: 110 mmol/L (ref 101–111)
Creatinine, Ser: 0.63 mg/dL (ref 0.44–1.00)
GFR calc Af Amer: >60 mL/min (ref >60)
GFR calc non Af Amer: >60 mL/min (ref >60)
Glucose, Bld: 95 mg/dL (ref 65–99)
Potassium: 3.9 mmol/L (ref 3.5–5.1)
Sodium: 144 mmol/L (ref 135–145)

## 2016-11-11 LAB — LEVETIRACETAM LEVEL: Levetiracetam Lvl: 32.7 ug/mL (ref 10.0–40.0)

## 2016-11-11 LAB — STREP PNEUMONIAE URINARY ANTIGEN: Strep Pneumo Urinary Antigen: NEGATIVE

## 2016-11-11 MED ORDER — OXYCODONE-ACETAMINOPHEN 5-325 MG PO TABS: 1.0000 | ORAL_TABLET | Freq: Once | ORAL | Status: DC

## 2016-11-11 MED ORDER — HYDROCODONE-ACETAMINOPHEN 5-325 MG PO TABS
1.0000 | ORAL_TABLET | ORAL | Status: DC | PRN
  Administered 2016-11-11 – 2016-11-12 (×5): 1 via ORAL
  Filled 2016-11-11 (×5): qty 1

## 2016-11-11 MED ORDER — IPRATROPIUM-ALBUTEROL 0.5-2.5 (3) MG/3ML IN SOLN
3.0000 mL | Freq: Three times a day (TID) | RESPIRATORY_TRACT | Status: DC
  Administered 2016-11-11: 3 mL via RESPIRATORY_TRACT
  Filled 2016-11-11: qty 3

## 2016-11-11 NOTE — Plan of Care (Signed)
Problem: Physical Regulation: Goal: Will remain free from infection Outcome: Progressing Receiving IV abx.

## 2016-11-11 NOTE — Progress Notes (Signed)
Resumed care of this patient from previous RN. Agree with the prior RN assessment. Will continue to monitor patient closely.

## 2016-11-11 NOTE — Progress Notes (Signed)
Patient ID: [name], female   DOB: [birth year], 51 y.o.   MRN: [id number]  PROGRESS NOTE    [name]  NFA:[id number] DOB: 1965-01-27 DOA: 11/09/2016  PCP: [name], MD   Brief Narrative:  51 year old female with history of seizures, depression who presented to ED with weakness, cold symptoms for 2 days. She saw PCP and was checked for flu which was negative. She has had productive cough with yellow sputum for past 1 day PTA.  CXR showed pneumonia, she was started azithromycin and rocephin. She had a mild seizure in ED which self resolved.  Assessment & Plan:   Active Problems: Lobar pneumonia, unspecified organism / Leukocytosis  - CXR showed right lower lobe infiltrate. Repeat CXR in 3-4 weeks to ensure resolution - Blood cultures showed no growth - Strep pneumonia is negative - Influenza is pending - Continue azithromycin and Rocephin  Anxiety and depression - Continue Prozac  Seizure disorder - Continue gabapentin, Lamictal, keppra  - No reports of seizures  Anemia of chronic disease - Hemoglobin is stable at 10.1  DVT prophylaxis: SCDs Code Status: full code  Family Communication: No family at the bedside Disposition Plan: Home likely next 24-48 hours   Consultants:   None  Procedures:   None  Antimicrobials:   Azithromycin and Rocephin 11/09/2016   Subjective: Feels better.  Objective: Vitals:   11/10/16 2100 11/11/16 0500 11/11/16 0542 11/11/16 0820  BP: 135/84  (!) 125/93   Pulse: (!) 108  90   Resp: (!) 21  18   Temp: 99.2 F (37.3 C)  97.9 F (36.6 C)   TempSrc: Oral  Oral   SpO2: 94%  95% 92%  Weight:  79.8 kg (175 lb 14.8 oz)    Height:        Intake/Output Summary (Last 24 hours) at 11/11/16 1027 Last data filed at 11/11/16 0600  Gross per 24 hour  Intake              860 ml  Output                0 ml  Net              860 ml   Filed Weights   11/09/16 1403 11/10/16 0456 11/11/16 0500  Weight: 76.9 kg (169 lb  8.5 oz) 78.9 kg (173 lb 15.1 oz) 79.8 kg (175 lb 14.8 oz)    Physical Exam  Constitutional: Appears well-developed and well-nourished. No distress.  CVS: RRR, S1/S2 + Pulmonary: Diminished breath sounds, no wheezing Abdominal: Soft. BS +,  no distension, tenderness, rebound or guarding.  Musculoskeletal: Normal range of motion. No edema and no tenderness.  Lymphadenopathy: No lymphadenopathy noted, cervical, inguinal. Neuro: Alert. Normal reflexes, muscle tone coordination. No cranial nerve deficit. Skin: Skin is warm and dry.  Psychiatric: Normal mood and affect. Behavior, judgment, thought content normal.     Data Reviewed: I have personally reviewed following labs and imaging studies  CBC:  Recent Labs Lab 11/09/16 0935 11/09/16 1524 11/10/16 0531 11/11/16 0527  WBC 10.5 11.6* 13.2* 11.1*  NEUTROABS 8.3*  --   --   --   HGB 10.4* 9.1* 9.2* 10.1*  HCT 31.3* 26.5* 27.9* 30.5*  MCV 88.7 88.9 88.9 88.2  PLT 213 183 198 469   Basic Metabolic Panel:  Recent Labs Lab 11/09/16 0935 11/09/16 1524 11/10/16 0531 11/11/16 0527  NA 137  --  142 144  K 3.5  --  3.6 3.9  CL 103  --  112* 110  CO2 26  --  24 25  GLUCOSE 109*  --  92 95  BUN 10  --  5* 6  CREATININE 0.67 0.57 0.54 0.63  CALCIUM 8.6*  --  7.4* 8.7*   GFR: Estimated Creatinine Clearance: 77.8 mL/min (by C-G formula based on SCr of 0.63 mg/dL). Liver Function Tests:  Recent Labs Lab 11/09/16 0935  AST 45*  ALT 38  ALKPHOS 63  BILITOT 0.7  PROT 6.8  ALBUMIN 3.9   No results for input(s): LIPASE, AMYLASE in the last 168 hours. No results for input(s): AMMONIA in the last 168 hours. Coagulation Profile: No results for input(s): INR, PROTIME in the last 168 hours. Cardiac Enzymes: No results for input(s): CKTOTAL, CKMB, CKMBINDEX, TROPONINI in the last 168 hours. BNP (last 3 results) No results for input(s): PROBNP in the last 8760 hours. HbA1C: No results for input(s): HGBA1C in the last 72  hours. CBG: No results for input(s): GLUCAP in the last 168 hours. Lipid Profile: No results for input(s): CHOL, HDL, LDLCALC, TRIG, CHOLHDL, LDLDIRECT in the last 72 hours. Thyroid Function Tests: No results for input(s): TSH, T4TOTAL, FREET4, T3FREE, THYROIDAB in the last 72 hours. Anemia Panel: No results for input(s): VITAMINB12, FOLATE, FERRITIN, TIBC, IRON, RETICCTPCT in the last 72 hours. Urine analysis:    Component Value Date/Time   COLORURINE YELLOW 11/09/2016 [place] 11/09/2016 0952   APPEARANCEUR Clear 04/09/2015 1101   LABSPEC 1.015 11/09/2016 0952   PHURINE 7.5 11/09/2016 [place] 11/09/2016 0952   HGBUR NEGATIVE 11/09/2016 0952   BILIRUBINUR NEGATIVE 11/09/2016 0952   BILIRUBINUR Negative 04/09/2015 [place] 11/09/2016 0952   PROTEINUR NEGATIVE 11/09/2016 0952   UROBILINOGEN negative 04/19/2014 1332   UROBILINOGEN 0.2 12/04/2013 1414   NITRITE NEGATIVE 11/09/2016 0952   LEUKOCYTESUR NEGATIVE 11/09/2016 0952   LEUKOCYTESUR Negative 04/09/2015 1101   Sepsis Labs: @LABRCNTIP (procalcitonin:4,lacticidven:4)    Recent Results (from the past 240 hour(s))  Blood Culture (routine x 2)     Status: None (Preliminary result)   Collection Time: 11/09/16  9:35 AM  Result Value Ref Range Status   Specimen Description BLOOD LEFT FOREARM  Final   Special Requests   Final    BOTTLES DRAWN AEROBIC AND ANAEROBIC Blood Culture adequate volume   Culture   Final    NO GROWTH < 24 HOURS Performed at [facility], [place] [address], [place], [place] [address]    Report Status PENDING  Incomplete  Blood Culture (routine x 2)     Status: None (Preliminary result)   Collection Time: 11/09/16  9:40 AM  Result Value Ref Range Status   Specimen Description BLOOD RIGHT FOREARM  Final   Special Requests   Final    BOTTLES DRAWN AEROBIC AND ANAEROBIC Blood Culture adequate volume   Culture   Final    NO GROWTH < 24  HOURS Performed at [facility], [place] [address], [place], [place] [address]    Report Status PENDING  Incomplete  Urine culture     Status: Abnormal   Collection Time: 11/09/16  9:52 AM  Result Value Ref Range Status   Specimen Description URINE, RANDOM  Final   Special Requests NONE  Final   Culture MULTIPLE SPECIES PRESENT, SUGGEST RECOLLECTION (A)  Final   Report Status 11/10/2016 FINAL  Final      Radiology Studies: Dg Chest 2  View Result Date: 11/09/2016 Right lower lobe infiltrate. Followup PA and lateral chest X-ray is recommended in 3-4 weeks following trial of antibiotic therapy to ensure resolution and exclude underlying malignancy.    Scheduled Meds: . enoxaparin   40 mg Subcutaneous Q24H  . FLUoxetine  60 mg Oral Daily  . fluticasone  2 spray Each Nare Daily  . gabapentin  600 mg Oral TID  . ipratropium-albutero  3 mL Nebulization Q6H  . lamoTRIgine  100 mg Oral BID  . levETIRAcetam  1,500 mg Oral BID  . mirtazapine  45 mg Oral QHS  . pramipexole  0.5 mg Oral BID  . traZODone  150 mg Oral QHS   Continuous Infusions: . azithromycin 500 mg (11/10/16 1755)  . cefTRIAXone (ROCEPHIN)  IV 1 g (11/11/16 0933)     LOS: 1 day    Time spent: 25 minutes  Greater than 50% of the time spent on counseling and coordinating the care.   Leisa Lenz, MD [facility] Pager [phone]  If 7PM-7AM, please contact night-coverage [email or web address] Password [email or web address] 11/11/2016, 10:27 AM

## 2016-11-12 DIAGNOSIS — R0902 Hypoxemia: Secondary | ICD-10-CM

## 2016-11-12 LAB — CBC
HCT: 31.4 % — ABNORMAL LOW (ref 36.0–46.0)
Hemoglobin: 10.5 g/dL — ABNORMAL LOW (ref 12.0–15.0)
MCH: 29.2 pg (ref 26.0–34.0)
MCHC: 33.4 g/dL (ref 30.0–36.0)
MCV: 87.2 fL (ref 78.0–100.0)
Platelets: 239 10*3/uL (ref 150–400)
RBC: 3.6 MIL/uL — ABNORMAL LOW (ref 3.87–5.11)
RDW: 13.8 % (ref 11.5–15.5)
WBC: 8.2 10*3/uL (ref 4.0–10.5)

## 2016-11-12 MED ORDER — AZITHROMYCIN 250 MG PO TABS: 500.0000 mg | ORAL_TABLET | Freq: Once | ORAL | 0 refills | Status: AC

## 2016-11-12 MED ORDER — CEFDINIR 300 MG PO CAPS: 300.0000 mg | ORAL_CAPSULE | Freq: Two times a day (BID) | ORAL | 0 refills | Status: DC

## 2016-11-12 MED ORDER — AZITHROMYCIN 250 MG PO TABS
500.0000 mg | ORAL_TABLET | Freq: Every day | ORAL | Status: DC
  Administered 2016-11-12: 500 mg via ORAL
  Filled 2016-11-12: qty 2

## 2016-11-12 NOTE — Discharge Summary (Signed)
Physician Discharge Summary  [name] RJJ:[id number] DOB: 06-Dec-1965 DOA: 11/09/2016  PCP: [name], MD  Admit date: 11/09/2016 Discharge date: 11/12/2016  Admitted From: Home Disposition: Home   Recommendations for Outpatient Follow-up:  1. Follow up with PCP in 1-2 weeks 2. Recommend epeat CXR in 3-4 weeks to ensure resolution of RLL infiltrate. 3. Please obtain BMP/CBC in one week  Home Health: None Equipment/Devices: None Discharge Condition: Stable CODE STATUS: Full Diet recommendation: As tolerated  Brief/Interim Summary: 51 year old female with history of seizures, depression who presented to ED with weakness, cold symptoms for 2 days. She saw PCP and was checked for flu which was negative. She has had productive cough with yellow sputum for past 1 day PTA.  CXR showed pneumonia, she was started azithromycin and rocephin. She had a mild seizure in ED which self resolved and she had no further seizure-like activity with restart of home medications. Breathing status has improved and she is discharged in stable condition to continue antibiotics.  Discharge Diagnoses:  Active Problems:   CAP (community acquired pneumonia)  Lobar pneumonia, unspecified organism / Leukocytosis  - CXR showed right lower lobe infiltrate. Repeat CXR in 3-4 weeks to ensure resolution - Blood cultures showed no growth - Strep pneumonia is negative - Continue antibiotics as below  Anxiety and depression - Continue Prozac  Seizure disorder - Continue gabapentin, Lamictal, keppra  - No reports of seizures  Anemia of chronic disease - Hemoglobin is stable at 10.1  Discharge Instructions Discharge Instructions    Discharge instructions    Complete by:  As directed    You were treated for pneumonia and have fortunately improved. You are stable for discharge with the following recommendations:  - Complete the course of antibiotics by taking azithromycin once tomorrow morning and  starting omnicef twice daily tomorrow morning for 3 more days. These have been sent to your pharmacy.  - If your symptoms worsen, seek medical attention right away, otherwise follow up with your PCP in the next 1 - 2 weeks.   Increase activity slowly    Complete by:  As directed      Allergies as of 11/12/2016      Reactions   Acetaminophen Other (See Comments)   Has fatty deposits on liver   Benadryl [diphenhydramine Hcl] Other (See Comments)   hyperactivity and seizures   Codeine Itching, Rash   seizures   Dilantin [phenytoin Sodium Extended] Other (See Comments)   Elevated LFT's   Melatonin Other (See Comments)   seizures   Ultram [tramadol Hcl] Other (See Comments)   Seizures   Vimpat [lacosamide] Other (See Comments)   Severe dizziness   Betadine [povidone Iodine] Rash   Latex Rash   Penicillins Itching, Rash   Has patient had a PCN reaction causing immediate rash, facial/tongue/throat swelling, SOB or lightheadedness with hypotension: No Has patient had a PCN reaction causing severe rash involving mucus membranes or skin necrosis: No Has patient had a PCN reaction that required hospitalization No Has patient had a PCN reaction occurring within the last 10 years: No If all of the above answers are "NO", then may proceed with Cephalosporin use.   Sulfa Antibiotics Rash   Tape Rash   Paper tape please   Vicodin [hydrocodone-acetaminophen] Itching      Medication List    TAKE these medications   cefdinir 300 MG capsule Commonly known as:  OMNICEF Take 1 capsule (300 mg total) by mouth 2 (two) times daily.  clonazePAM 0.5 MG tablet Commonly known as:  KLONOPIN Take 1 tablet (0.5 mg total) by mouth daily as needed for anxiety.   cyclobenzaprine 10 MG tablet Commonly known as:  FLEXERIL Take 1 tablet (10 mg total) by mouth 3 (three) times daily as needed for muscle spasms.   FLUoxetine HCl 60 MG Tabs Take 60 mg by mouth daily.   fluticasone 50 MCG/ACT nasal  spray Commonly known as:  FLONASE Place 2 sprays into both nostrils daily. What changed:  when to take this  reasons to take this   gabapentin 600 MG tablet Commonly known as:  NEURONTIN TAKE 1 TABLET BY MOUTH THREE TIMES DAILY   lamoTRIgine 100 MG tablet Commonly known as:  LAMICTAL Take 1 tablet (100 mg total) by mouth 2 (two) times daily.   levETIRAcetam 500 MG tablet Commonly known as:  KEPPRA Take 3 tablets (1,500 mg total) by mouth 2 (two) times daily.   loratadine 10 MG tablet Commonly known as:  CLARITIN Take 1 tablet (10 mg total) by mouth daily.   mirtazapine 45 MG tablet Commonly known as:  REMERON Take 1 tablet (45 mg total) by mouth at bedtime.   pramipexole 0.5 MG tablet Commonly known as:  MIRAPEX TAKE ONE TABLET BY MOUTH TWICE DAILY   traZODone 150 MG tablet Commonly known as:  DESYREL Take 1 tablet (150 mg total) by mouth at bedtime.     ASK your doctor about these medications   azithromycin 250 MG tablet Commonly known as:  ZITHROMAX Take 2 tablets (500 mg total) by mouth once. Ask about: Should I take this medication?      Follow-up Information    [name], MD Follow up.   Specialty:  Family Medicine Contact information: [address] [place] [address] [phone]          Allergies  Allergen Reactions  . Acetaminophen Other (See Comments)    Has fatty deposits on liver  . Benadryl [Diphenhydramine Hcl] Other (See Comments)    hyperactivity and seizures  . Codeine Itching and Rash    seizures  . Dilantin [Phenytoin Sodium Extended] Other (See Comments)    Elevated LFT's  . Melatonin Other (See Comments)    seizures  . Ultram [Tramadol Hcl] Other (See Comments)    Seizures  . Vimpat [Lacosamide] Other (See Comments)    Severe dizziness  . Betadine [Povidone Iodine] Rash  . Latex Rash  . Penicillins Itching and Rash    Has patient had a PCN reaction causing immediate rash, facial/tongue/throat swelling, SOB or  lightheadedness with hypotension: No Has patient had a PCN reaction causing severe rash involving mucus membranes or skin necrosis: No Has patient had a PCN reaction that required hospitalization No Has patient had a PCN reaction occurring within the last 10 years: No If all of the above answers are "NO", then may proceed with Cephalosporin use.  . Sulfa Antibiotics Rash  . Tape Rash    Paper tape please  . Vicodin [Hydrocodone-Acetaminophen] Itching    Consultations:  None  Procedures/Studies: Dg Chest 2 View  Result Date: 11/14/2016 CLINICAL DATA:  Seizure today.  Disoriented. EXAM: CHEST  2 VIEW COMPARISON:  11/09/2016.  03/28/2016. FINDINGS: Poor inspiration. Mild cardiomegaly. Patchy bilateral pulmonary infiltrates right more than left consistent with pneumonia. This is slightly improved on the right and slightly worsened on the left compared to the previous study. No effusions. No acute bone finding. IMPRESSION: Patchy bilateral lower lobe pneumonia, slightly improved on the  right and slightly worsened on the left compared to the study of 10/28. Poor inspiration today however. Electronically Signed   By: Nelson Chimes M.D.   On: 11/14/2016 15:36   Dg Chest 2 View  Result Date: 11/09/2016 CLINICAL DATA:  Pt reports fever, non-productive cough x 3 days Denies hx heart or lung conditions, nonsmoker Hx chronic bronchitis (per history notes) EXAM: CHEST  2 VIEW COMPARISON:  03/28/2016 FINDINGS: Heart size is normal. There is dense airspace filling opacity within the right lower lobe, compatible with infectious infiltrate. The left lung is clear. No pulmonary edema. IMPRESSION: Right lower lobe infiltrate. Followup PA and lateral chest X-ray is recommended in 3-4 weeks following trial of antibiotic therapy to ensure resolution and exclude underlying malignancy. Electronically Signed   By: Nolon Nations M.D.   On: 11/09/2016 09:26   Ct Head Wo Contrast  Result Date: 11/14/2016 CLINICAL  DATA:  2 seizure episodes this morning. Altered level of consciousness since. EXAM: CT HEAD WITHOUT CONTRAST TECHNIQUE: Contiguous axial images were obtained from the base of the skull through the vertex without intravenous contrast. COMPARISON:  CT head without contrast 01/23/2015 FINDINGS: Brain: No acute infarct, hemorrhage, or mass lesion is present. The ventricles are of normal size. No focal lesion is present to explain seizures. No significant extraaxial fluid collection is present. Vascular: Faint atherosclerotic calcifications are present within the cavernous internal carotid arteries. There is no hyperdense vessel. Skull: The calvarium is intact. No focal lytic or blastic lesions are present. Sinuses/Orbits: The paranasal sinuses and mastoid air cells are clear. The visualized orbits are within normal limits. IMPRESSION: Negative CT of the head. Electronically Signed   By: San Morelle M.D.   On: 11/14/2016 19:43    Subjective: Feels better. No dyspnea.   Discharge Exam: Vitals:   11/12/16 0524 11/12/16 1300  BP: (!) 144/88 (!) 137/97  Pulse: 66 60  Resp: 18 18  Temp: 97.8 F (36.6 C) 98 F (36.7 C)  SpO2: 97% 93%   General: Pt is alert, awake, not in acute distress Cardiovascular: RRR, S1/S2 +, no rubs, no gallops Respiratory: CTA bilaterally, no wheezing, no rhonchi Abdominal: Soft, NT, ND, bowel sounds + Extremities: No edema, no cyanosis  Labs: BNP (last 3 results) No results for input(s): BNP in the last 8760 hours. Basic Metabolic Panel:  Recent Labs Lab 11/09/16 0935 11/09/16 1524 11/10/16 0531 11/11/16 0527 11/14/16 1445 11/14/16 1446 11/15/16 0645  NA 137  --  142 144  --  143 144  K 3.5  --  3.6 3.9  --  3.9 3.7  CL 103  --  112* 110  --  106 110  CO2 26  --  24 25  --  21* 25  GLUCOSE 109*  --  92 95  --  90 85  BUN 10  --  5* 6  --  12 12  CREATININE 0.67 0.57 0.54 0.63  --  0.57 0.67  CALCIUM 8.6*  --  7.4* 8.7*  --  9.5 8.5*  MG  --   --    --   --  2.5*  --   --    Liver Function Tests:  Recent Labs Lab 11/09/16 0935 11/14/16 1446 11/15/16 0645  AST 45* 55* 68*  ALT 38 40 48  ALKPHOS 63 78 57  BILITOT 0.7 0.8 0.7  PROT 6.8 8.4* 6.7  ALBUMIN 3.9 4.4 3.5   No results for input(s): LIPASE, AMYLASE in the last 168 hours.  No results for input(s): AMMONIA in the last 168 hours. CBC:  Recent Labs Lab 11/09/16 0935  11/10/16 0531 11/11/16 0527 11/12/16 0624 11/14/16 1446 11/15/16 0645  WBC 10.5  < > 13.2* 11.1* 8.2 9.4 6.0  NEUTROABS 8.3*  --   --   --   --  5.2  --   HGB 10.4*  < > 9.2* 10.1* 10.5* 12.6 10.9*  HCT 31.3*  < > 27.9* 30.5* 31.4* 37.2 32.8*  MCV 88.7  < > 88.9 88.2 87.2 87.7 88.2  PLT 213  < > 198 225 239 394 328  < > = values in this interval not displayed. Cardiac Enzymes: No results for input(s): CKTOTAL, CKMB, CKMBINDEX, TROPONINI in the last 168 hours. BNP: Invalid input(s): POCBNP CBG: No results for input(s): GLUCAP in the last 168 hours. D-Dimer No results for input(s): DDIMER in the last 72 hours. Hgb A1c No results for input(s): HGBA1C in the last 72 hours. Lipid Profile No results for input(s): CHOL, HDL, LDLCALC, TRIG, CHOLHDL, LDLDIRECT in the last 72 hours. Thyroid function studies No results for input(s): TSH, T4TOTAL, T3FREE, THYROIDAB in the last 72 hours.  Invalid input(s): FREET3 Anemia work up No results for input(s): VITAMINB12, FOLATE, FERRITIN, TIBC, IRON, RETICCTPCT in the last 72 hours. Urinalysis    Component Value Date/Time   COLORURINE YELLOW 11/09/2016 [place] 11/09/2016 0952   APPEARANCEUR Clear 04/09/2015 1101   LABSPEC 1.015 11/09/2016 0952   PHURINE 7.5 11/09/2016 [place] 11/09/2016 0952   HGBUR NEGATIVE 11/09/2016 0952   BILIRUBINUR NEGATIVE 11/09/2016 0952   BILIRUBINUR Negative 04/09/2015 [place] 11/09/2016 0952   PROTEINUR NEGATIVE 11/09/2016 0952   UROBILINOGEN negative 04/19/2014 1332    UROBILINOGEN 0.2 12/04/2013 1414   NITRITE NEGATIVE 11/09/2016 0952   LEUKOCYTESUR NEGATIVE 11/09/2016 0952   LEUKOCYTESUR Negative 04/09/2015 1101    Microbiology Recent Results (from the past 240 hour(s))  Blood Culture (routine x 2)     Status: None   Collection Time: 11/09/16  9:35 AM  Result Value Ref Range Status   Specimen Description BLOOD LEFT FOREARM  Final   Special Requests   Final    BOTTLES DRAWN AEROBIC AND ANAEROBIC Blood Culture adequate volume   Culture   Final    NO GROWTH 5 DAYS Performed at [facility], [place] [address], [place], [place] [address]    Report Status 11/14/2016 FINAL  Final  Blood Culture (routine x 2)     Status: None   Collection Time: 11/09/16  9:40 AM  Result Value Ref Range Status   Specimen Description BLOOD RIGHT FOREARM  Final   Special Requests   Final    BOTTLES DRAWN AEROBIC AND ANAEROBIC Blood Culture adequate volume   Culture   Final    NO GROWTH 5 DAYS Performed at [facility], [place] [address] [place], [place] [address]    Report Status 11/14/2016 FINAL  Final  Urine culture     Status: Abnormal   Collection Time: 11/09/16  9:52 AM  Result Value Ref Range Status   Specimen Description URINE, RANDOM  Final   Special Requests NONE  Final   Culture MULTIPLE SPECIES PRESENT, SUGGEST RECOLLECTION (A)  Final   Report Status 11/10/2016 FINAL  Final  Blood culture (routine x 2)     Status: None (Preliminary result)   Collection Time: 11/14/16  4:02 PM  Result Value Ref Range Status  Specimen Description BLOOD LEFT ANTECUBITAL  Final   Special Requests   Final    BOTTLES DRAWN AEROBIC AND ANAEROBIC Blood Culture adequate volume   Culture   Final    NO GROWTH < 24 HOURS Performed at [facility], [address], [place], [place] [address]    Report Status PENDING  Incomplete  Blood culture (routine x 2)     Status: None (Preliminary result)   Collection Time: 11/14/16  4:05 PM  Result Value Ref Range Status    Specimen Description BLOOD RIGHT HAND  Final   Special Requests   Final    BOTTLES DRAWN AEROBIC AND ANAEROBIC Blood Culture adequate volume   Culture   Final    NO GROWTH < 24 HOURS Performed at [facility], [place] [address] [place], [place] [address]    Report Status PENDING  Incomplete  MRSA PCR Screening     Status: None   Collection Time: 11/15/16 12:25 AM  Result Value Ref Range Status   MRSA by PCR NEGATIVE NEGATIVE Final    Comment:        The GeneXpert MRSA Assay (FDA approved for NASAL specimens only), is one component of a comprehensive MRSA colonization surveillance program. It is not intended to diagnose MRSA infection nor to guide or monitor treatment for MRSA infections.     Time coordinating discharge: Approximately 40 minutes  Vance Gather, MD  [facility] 11/12/2016, 7:15 PM Pager [phone]

## 2016-11-12 NOTE — Progress Notes (Signed)
PHARMACIST - PHYSICIAN COMMUNICATION DR:   Bonner Puna CONCERNING: Antibiotic IV to Oral Route Change Policy  RECOMMENDATION: This patient is receiving Azithromycin by the intravenous route.  Based on criteria approved by the Pharmacy and Therapeutics Committee, the antibiotic(s) is/are being converted to the equivalent oral dose form(s).   DESCRIPTION: These criteria include:  Patient being treated for a respiratory tract infection, urinary tract infection, cellulitis or clostridium difficile associated diarrhea if on metronidazole  The patient is not neutropenic and does not exhibit a GI malabsorption state  The patient is eating (either orally or via tube) and/or has been taking other orally administered medications for a least 24 hours  The patient is improving clinically and has a Tmax < 100.5  If you have questions about this conversion, please contact the Pharmacy Department  []   [phone] )  [facility] []   [phone] )  [facility] []   [phone] )  [facility] []   [phone] )  [facility] [x]   [phone] )  [place], PharmD, BCPS Pager: [phone] 11/12/2016 7:41 AM

## 2016-11-12 NOTE — Care Management Note (Signed)
Case Management Note  Patient Details  Name: [name] MRN: [id number] Date of Birth: [birth year]  Subjective/Objective: Benefit checked for:abx-vantin(non formulary);omnicef(need prior auth)-Cefdinir only cost $15-MD notified-will choose Cefdinir.Patient informed.                   Action/Plan:d/c plan home.   Expected Discharge Date:                  Expected Discharge Plan:  Home/Self Care  In-House Referral:     Discharge planning Services  CM Consult, Medication Assistance  Post Acute Care Choice:    Choice offered to:     DME Arranged:    DME Agency:     HH Arranged:    HH Agency:     Status of Service:  In process, will continue to follow  If discussed at Long Length of Stay Meetings, dates discussed:    Additional Comments:  Dessa Phi, RN 11/12/2016, 12:48 PM

## 2016-11-14 ENCOUNTER — Emergency Department (HOSPITAL_COMMUNITY): Payer: PPO

## 2016-11-14 ENCOUNTER — Inpatient Hospital Stay (HOSPITAL_COMMUNITY)
Admission: EM | Admit: 2016-11-14 | Discharge: 2016-11-17 | DRG: 100 | Disposition: A | Discharge status: Home / Self Care | Payer: PPO | Attending: Family Medicine | Admitting: Family Medicine

## 2016-11-14 ENCOUNTER — Encounter (HOSPITAL_COMMUNITY): Payer: Self-pay | Admitting: Emergency Medicine

## 2016-11-14 ENCOUNTER — Inpatient Hospital Stay (HOSPITAL_COMMUNITY): Payer: PPO

## 2016-11-14 DIAGNOSIS — R0902 Hypoxemia: Secondary | ICD-10-CM | POA: Diagnosis not present

## 2016-11-14 DIAGNOSIS — Z882 Allergy status to sulfonamides status: Secondary | ICD-10-CM | POA: Diagnosis not present

## 2016-11-14 DIAGNOSIS — L309 Dermatitis, unspecified: Secondary | ICD-10-CM | POA: Diagnosis not present

## 2016-11-14 DIAGNOSIS — G2581 Restless legs syndrome: Secondary | ICD-10-CM | POA: Diagnosis not present

## 2016-11-14 DIAGNOSIS — Z87442 Personal history of urinary calculi: Secondary | ICD-10-CM | POA: Diagnosis not present

## 2016-11-14 DIAGNOSIS — J189 Pneumonia, unspecified organism: Secondary | ICD-10-CM | POA: Diagnosis not present

## 2016-11-14 DIAGNOSIS — G40919 Epilepsy, unspecified, intractable, without status epilepticus: Secondary | ICD-10-CM

## 2016-11-14 DIAGNOSIS — Z886 Allergy status to analgesic agent status: Secondary | ICD-10-CM | POA: Diagnosis not present

## 2016-11-14 DIAGNOSIS — Z888 Allergy status to other drugs, medicaments and biological substances status: Secondary | ICD-10-CM

## 2016-11-14 DIAGNOSIS — F419 Anxiety disorder, unspecified: Secondary | ICD-10-CM | POA: Diagnosis present

## 2016-11-14 DIAGNOSIS — Z7951 Long term (current) use of inhaled steroids: Secondary | ICD-10-CM | POA: Diagnosis not present

## 2016-11-14 DIAGNOSIS — Z809 Family history of malignant neoplasm, unspecified: Secondary | ICD-10-CM | POA: Diagnosis not present

## 2016-11-14 DIAGNOSIS — Z91040 Latex allergy status: Secondary | ICD-10-CM

## 2016-11-14 DIAGNOSIS — Z833 Family history of diabetes mellitus: Secondary | ICD-10-CM

## 2016-11-14 DIAGNOSIS — Z818 Family history of other mental and behavioral disorders: Secondary | ICD-10-CM

## 2016-11-14 DIAGNOSIS — R569 Unspecified convulsions: Secondary | ICD-10-CM

## 2016-11-14 DIAGNOSIS — K760 Fatty (change of) liver, not elsewhere classified: Secondary | ICD-10-CM | POA: Diagnosis not present

## 2016-11-14 DIAGNOSIS — J181 Lobar pneumonia, unspecified organism: Secondary | ICD-10-CM | POA: Diagnosis not present

## 2016-11-14 DIAGNOSIS — E7800 Pure hypercholesterolemia, unspecified: Secondary | ICD-10-CM | POA: Diagnosis present

## 2016-11-14 DIAGNOSIS — Z90710 Acquired absence of both cervix and uterus: Secondary | ICD-10-CM | POA: Diagnosis not present

## 2016-11-14 DIAGNOSIS — I4581 Long QT syndrome: Secondary | ICD-10-CM | POA: Diagnosis not present

## 2016-11-14 DIAGNOSIS — Z96642 Presence of left artificial hip joint: Secondary | ICD-10-CM | POA: Diagnosis not present

## 2016-11-14 DIAGNOSIS — M1990 Unspecified osteoarthritis, unspecified site: Secondary | ICD-10-CM | POA: Diagnosis not present

## 2016-11-14 DIAGNOSIS — Z96653 Presence of artificial knee joint, bilateral: Secondary | ICD-10-CM | POA: Diagnosis present

## 2016-11-14 DIAGNOSIS — Y95 Nosocomial condition: Secondary | ICD-10-CM | POA: Diagnosis present

## 2016-11-14 DIAGNOSIS — G40909 Epilepsy, unspecified, not intractable, without status epilepticus: Secondary | ICD-10-CM | POA: Diagnosis not present

## 2016-11-14 DIAGNOSIS — Z880 Allergy status to penicillin: Secondary | ICD-10-CM

## 2016-11-14 DIAGNOSIS — R4182 Altered mental status, unspecified: Secondary | ICD-10-CM | POA: Diagnosis not present

## 2016-11-14 DIAGNOSIS — F329 Major depressive disorder, single episode, unspecified: Secondary | ICD-10-CM | POA: Diagnosis not present

## 2016-11-14 LAB — CBC WITH DIFFERENTIAL/PLATELET
Basophils Absolute: 0 10*3/uL (ref 0.0–0.1)
Basophils Relative: 0 %
Eosinophils Absolute: 0 10*3/uL (ref 0.0–0.7)
Eosinophils Relative: 0 %
HCT: 37.2 % (ref 36.0–46.0)
Hemoglobin: 12.6 g/dL (ref 12.0–15.0)
Lymphocytes Relative: 35 %
Lymphs Abs: 3.3 10*3/uL (ref 0.7–4.0)
MCH: 29.7 pg (ref 26.0–34.0)
MCHC: 33.9 g/dL (ref 30.0–36.0)
MCV: 87.7 fL (ref 78.0–100.0)
Monocytes Absolute: 0.9 10*3/uL (ref 0.1–1.0)
Monocytes Relative: 9 %
Neutro Abs: 5.2 10*3/uL (ref 1.7–7.7)
Neutrophils Relative %: 56 %
Platelets: 394 10*3/uL (ref 150–400)
RBC: 4.24 MIL/uL (ref 3.87–5.11)
RDW: 13.5 % (ref 11.5–15.5)
WBC: 9.4 10*3/uL (ref 4.0–10.5)

## 2016-11-14 LAB — COMPREHENSIVE METABOLIC PANEL
ALT: 40 U/L (ref 14–54)
AST: 55 U/L — ABNORMAL HIGH (ref 15–41)
Albumin: 4.4 g/dL (ref 3.5–5.0)
Alkaline Phosphatase: 78 U/L (ref 38–126)
Anion gap: 16 — ABNORMAL HIGH (ref 5–15)
BUN: 12 mg/dL (ref 6–20)
CO2: 21 mmol/L — ABNORMAL LOW (ref 22–32)
Calcium: 9.5 mg/dL (ref 8.9–10.3)
Chloride: 106 mmol/L (ref 101–111)
Creatinine, Ser: 0.57 mg/dL (ref 0.44–1.00)
GFR calc Af Amer: >60 mL/min (ref >60)
GFR calc non Af Amer: >60 mL/min (ref >60)
Glucose, Bld: 90 mg/dL (ref 65–99)
Potassium: 3.9 mmol/L (ref 3.5–5.1)
Sodium: 143 mmol/L (ref 135–145)
Total Bilirubin: 0.8 mg/dL (ref 0.3–1.2)
Total Protein: 8.4 g/dL — ABNORMAL HIGH (ref 6.5–8.1)

## 2016-11-14 LAB — BLOOD GAS, VENOUS
Acid-Base Excess: 0.3 mmol/L (ref 0.0–2.0)
Bicarbonate: 23.9 mmol/L (ref 20.0–28.0)
O2 Saturation: 79.4 %
Patient temperature: 98.6
pCO2, Ven: 37.3 mmHg — ABNORMAL LOW (ref 44.0–60.0)
pH, Ven: 7.423 (ref 7.250–7.430)
pO2, Ven: 48.4 mmHg — ABNORMAL HIGH (ref 32.0–45.0)

## 2016-11-14 LAB — CULTURE, BLOOD (ROUTINE X 2)
Culture: NO GROWTH
Culture: NO GROWTH
Special Requests: ADEQUATE
Special Requests: ADEQUATE

## 2016-11-14 LAB — MAGNESIUM: Magnesium: 2.5 mg/dL — ABNORMAL HIGH (ref 1.7–2.4)

## 2016-11-14 LAB — I-STAT CG4 LACTIC ACID, ED
Lactic Acid, Venous: 2.34 mmol/L (ref 0.5–1.9)
Lactic Acid, Venous: 1.59 mmol/L (ref 0.5–1.9)

## 2016-11-14 MED ORDER — CEFEPIME HCL 1 G IJ SOLR
1.0000 g | Freq: Three times a day (TID) | INTRAMUSCULAR | Status: DC
  Administered 2016-11-15 – 2016-11-17 (×8): 1 g via INTRAVENOUS
  Filled 2016-11-14 (×8): qty 1

## 2016-11-14 MED ORDER — SODIUM CHLORIDE 0.9 % IV SOLN
1500.0000 mg | Freq: Once | INTRAVENOUS | Status: AC
  Administered 2016-11-14: 1500 mg via INTRAVENOUS
  Filled 2016-11-14: qty 15

## 2016-11-14 MED ORDER — VANCOMYCIN HCL IN DEXTROSE 1-5 GM/200ML-% IV SOLN
1000.0000 mg | Freq: Once | INTRAVENOUS | Status: AC
  Administered 2016-11-14: 1000 mg via INTRAVENOUS
  Filled 2016-11-14: qty 200

## 2016-11-14 MED ORDER — DEXTROSE 5 % IV SOLN
2.0000 g | Freq: Once | INTRAVENOUS | Status: AC
  Administered 2016-11-14: 2 g via INTRAVENOUS
  Filled 2016-11-14: qty 2

## 2016-11-14 MED ORDER — SODIUM CHLORIDE 0.9 % IV BOLUS (SEPSIS)
1000.0000 mL | Freq: Once | INTRAVENOUS | Status: AC
  Administered 2016-11-14: 1000 mL via INTRAVENOUS

## 2016-11-14 MED ORDER — VANCOMYCIN HCL 10 G IV SOLR
1250.0000 mg | INTRAVENOUS | Status: DC
  Administered 2016-11-15 – 2016-11-16 (×2): 1250 mg via INTRAVENOUS
  Filled 2016-11-14 (×3): qty 1250

## 2016-11-14 MED ORDER — LAMOTRIGINE 100 MG PO TABS
100.0000 mg | ORAL_TABLET | Freq: Once | ORAL | Status: AC
  Administered 2016-11-14: 100 mg via ORAL
  Filled 2016-11-14: qty 1

## 2016-11-14 NOTE — ED Notes (Signed)
Bed: WA01 Expected date:  Expected time:  Means of arrival:  Comments: triage

## 2016-11-14 NOTE — Progress Notes (Signed)
A consult was received from an ED physician for Vancomycin and Zosyn per pharmacy dosing.  The patient's profile has been reviewed for ht/wt/allergies/indication/available labs. Zosyn changed to Cefepime. A one time order has been placed for Cefepime 2gm and Vancomycin 1gm.  Further antibiotics/pharmacy consults should be ordered by admitting physician if indicated.                       Thank you,  Minda Ditto 11/14/2016  4:15 PM

## 2016-11-14 NOTE — Progress Notes (Signed)
Pharmacy Antibiotic Note  [name] is a 51 y.o. female admitted on 11/14/2016 with pneumonia.  Pharmacy has been consulted for Vancomycin & Cefepime dosing.  Plan: Vancomycin 1gm in ED, followed by 1250mg  IV every 24 hours.  Goal trough 15-20 mcg/mL.  Cefepime 2gm x1 then 1gm q8  Height: 5' (152.4 cm) Weight: 159 lb 13.3 oz (72.5 kg) IBW/kg (Calculated) : 45.5  Temp (24hrs), Avg:98.5 F (36.9 C), Min:98.2 F (36.8 C), Max:98.8 F (37.1 C)   Recent Labs Lab 11/09/16 0935 11/09/16 0942 11/09/16 1524 11/10/16 0531 11/11/16 0527 11/12/16 0624 11/14/16 1446 11/14/16 1611 11/14/16 1743  WBC 10.5  --  11.6* 13.2* 11.1* 8.2 9.4  --   --   CREATININE 0.67  --  0.57 0.54 0.63  --  0.57  --   --   LATICACIDVEN  --  1.27  --   --   --   --   --  2.34* 1.59    Estimated Creatinine Clearance: 73.9 mL/min (by C-G formula based on SCr of 0.57 mg/dL).    Allergies  Allergen Reactions  . Acetaminophen Other (See Comments)    Has fatty deposits on liver  . Benadryl [Diphenhydramine Hcl] Other (See Comments)    hyperactivity and seizures  . Codeine Itching and Rash    seizures  . Dilantin [Phenytoin Sodium Extended] Other (See Comments)    Elevated LFT's  . Melatonin Other (See Comments)    seizures  . Ultram [Tramadol Hcl] Other (See Comments)    Seizures  . Vimpat [Lacosamide] Other (See Comments)    Severe dizziness  . Betadine [Povidone Iodine] Rash  . Latex Rash  . Penicillins Itching and Rash    Has patient had a PCN reaction causing immediate rash, facial/tongue/throat swelling, SOB or lightheadedness with hypotension: No Has patient had a PCN reaction causing severe rash involving mucus membranes or skin necrosis: No Has patient had a PCN reaction that required hospitalization No Has patient had a PCN reaction occurring within the last 10 years: No If all of the above answers are "NO", then may proceed with Cephalosporin use.  . Sulfa Antibiotics Rash  . Tape  Rash    Paper tape please  . Vicodin [Hydrocodone-Acetaminophen] Itching   Antimicrobials this admission: 11/2 Cefepime >>  11/2 Vancomycin >>   Dose adjustments this admission:  Microbiology results: 11/2 BCx: sent  Thank you for allowing pharmacy to be a part of this patient's care.  Minda Ditto 11/14/2016 10:33 PM

## 2016-11-14 NOTE — ED Triage Notes (Signed)
Patient presents with family stating she did not take her medications last night, had a seizure around 740 am and another witnessed seizure about 30 mins later. Brother states lasted around 5 mins. Family states pt has not acted her norm sense. Family states she fell after 1st seizure. Pt disorientated not speaking in triage.   Family adds this is not the first time this has happened however she is usually not this disorientated for this long.

## 2016-11-14 NOTE — H&P (Addendum)
TRH H&P    Patient Demographics:    [name], is a 51 y.o. female  MRN: [id number]  DOB - Apr 21, 1965  Admit Date - 11/14/2016  Referring MD/NP/PA: Dr. Ellender Hose  Outpatient Primary MD for the patient is [name], MD  Patient coming from: Home  Chief Complaint  Patient presents with  . Altered Mental Status  . Seizures      HPI:    [name]  is a 51 y.o. female, with history of seizures, restless leg syndrome, kidney stone, CKD stage III who was recently discharged from the hospital on 11/12/2016 after she was treated for lobar pneumonia.  Patient has been taking her medications as prescribed.  Last night she did not sleep as her husband is currently admitted at [facility] in ICU.  This morning patient had a seizure and was postictal until noontime.  When she continued to be confused patient's family got concerned and brought her to hospital for further evaluation. Patient had another seizure in the hospital, was given 1500 milligrams IV Keppra x1. Chest x-ray showed p slightly improved right and worsening on the left bilateral lower lobe pneumonia. Patient given vancomycin and cefepime in the hospital Lactic acid elevated to 2.34.  No nausea vomiting Complains of 2 episodes of loose stool. Denies chest pain or shortness of breath Denies abdominal pain. No dysuria urgency or frequency of urination. No tongue biting or urinary incontinence during seizure  Patient is mentally back to her baseline.   Review of systems:      All other systems reviewed and are negative.   With Past History of the following :    Past Medical History:  Diagnosis Date  . Arthritis    "knees" (04/22/2012)  . Bronchitis   . Chronic bronchitis ([place])    "yearly; when the weather changes" (04/22/2012)  . Chronic kidney disease    kidney stones  . Colon polyps    adenomatous and hyperplastic-  .  Constipation   . Depression   . Eczema   . Epilepsy ([place])    "been having them right often here lately" (04/22/2012)  . Fatty liver   . TMLYYTKP(546.5)    "1/wk" (04/22/2012)  . High cholesterol   . History of kidney stones   . Osteoarthritis    [name] 04/22/2012  . Other convulsions 05/21/12   non-epileptic spells  . Rectal bleed    in toilet- bright red  . Restless leg   . Seizures ([place])   . Vertigo       Past Surgical History:  Procedure Laterality Date  . ABDOMINAL HYSTERECTOMY  2001  . BLADDER SUSPENSION    . BUNIONECTOMY Left 2000  . CESAREAN SECTION  1987; 1988  . COLONOSCOPY    . JOINT REPLACEMENT    . MASS EXCISION  10/22/2011   Procedure: EXCISION MASS;  Surgeon: [name], MD;  Location: [facility];  Service: General;  Laterality: Right;  excision right buttock mass  . TOTAL HIP ARTHROPLASTY Left 1993; 1995; 2000  . TOTAL KNEE ARTHROPLASTY  Left 05/07/2015   Procedure: TOTAL LEFT KNEE ARTHROPLASTY;  Surgeon: [name], MD;  Location: [facility];  Service: Orthopedics;  Laterality: Left;  . TOTAL KNEE ARTHROPLASTY Right 10/01/2015   Procedure: RIGHT TOTAL KNEE ARTHROPLASTY;  Surgeon: [name], MD;  Location: [facility];  Service: Orthopedics;  Laterality: Right;      Social History:      Social History  Substance Use Topics  . Smoking status: Never Smoker  . Smokeless tobacco: Never Used  . Alcohol use No     Comment: 12-25-2015 per pt no       Family History :     Family History  Problem Relation Age of Onset  . Cancer Mother   . Depression Father   . Cancer Brother   . Diabetes Brother   . Cancer Maternal Aunt   . Diabetes Maternal Aunt   . Bipolar disorder Son   . Drug abuse Son       Home Medications:   Prior to Admission medications   Medication Sig Start Date End Date Taking? Authorizing Provider  clonazePAM (KLONOPIN) 0.5 MG tablet Take 1 tablet (0.5 mg total) by mouth daily as needed for anxiety. 10/20/16  Yes Norman Clay, MD    cyclobenzaprine (FLEXERIL) 10 MG tablet Take 1 tablet (10 mg total) by mouth 3 (three) times daily as needed for muscle spasms. 09/05/16  Yes [name], PA-C  FLUoxetine HCl 60 MG TABS Take 60 mg by mouth daily. 10/20/16  Yes Norman Clay, MD  gabapentin (NEURONTIN) 600 MG tablet TAKE 1 TABLET BY MOUTH THREE TIMES DAILY 08/18/16  Yes [name], PA-C  lamoTRIgine (LAMICTAL) 100 MG tablet Take 1 tablet (100 mg total) by mouth 2 (two) times daily. 04/09/16  Yes Dohmeier, Asencion Partridge, MD  levETIRAcetam (KEPPRA) 500 MG tablet Take 3 tablets (1,500 mg total) by mouth 2 (two) times daily. 04/09/16  Yes Dohmeier, Asencion Partridge, MD  mirtazapine (REMERON) 45 MG tablet Take 1 tablet (45 mg total) by mouth at bedtime. 10/20/16  Yes Hisada, Elie Goody, MD  pramipexole (MIRAPEX) 0.5 MG tablet TAKE ONE TABLET BY MOUTH TWICE DAILY 08/27/16  Yes [name], MD  traZODone (DESYREL) 150 MG tablet Take 1 tablet (150 mg total) by mouth at bedtime. 05/19/16  Yes [name], PA-C  cefdinir (OMNICEF) 300 MG capsule Take 1 capsule (300 mg total) by mouth 2 (two) times daily. Patient not taking: Reported on 11/14/2016 11/12/16   Patrecia Pour, MD  fluticasone [facility]) 50 MCG/ACT nasal spray Place 2 sprays into both nostrils daily. Patient taking differently: Place 2 sprays into both nostrils daily as needed for allergies.  11/07/16   Dettinger, Fransisca Kaufmann, MD  loratadine (CLARITIN) 10 MG tablet Take 1 tablet (10 mg total) by mouth daily. 11/07/16   Dettinger, Fransisca Kaufmann, MD     Allergies:     Allergies  Allergen Reactions  . Acetaminophen Other (See Comments)    Has fatty deposits on liver  . Benadryl [Diphenhydramine Hcl] Other (See Comments)    hyperactivity and seizures  . Codeine Itching and Rash    seizures  . Dilantin [Phenytoin Sodium Extended] Other (See Comments)    Elevated LFT's  . Melatonin Other (See Comments)    seizures  . Ultram [Tramadol Hcl] Other (See Comments)    Seizures  . Vimpat [Lacosamide] Other  (See Comments)    Severe dizziness  . Betadine [Povidone Iodine] Rash  . Latex Rash  . Penicillins Itching and Rash  Has patient had a PCN reaction causing immediate rash, facial/tongue/throat swelling, SOB or lightheadedness with hypotension: No Has patient had a PCN reaction causing severe rash involving mucus membranes or skin necrosis: No Has patient had a PCN reaction that required hospitalization No Has patient had a PCN reaction occurring within the last 10 years: No If all of the above answers are "NO", then may proceed with Cephalosporin use.  . Sulfa Antibiotics Rash  . Tape Rash    Paper tape please  . Vicodin [Hydrocodone-Acetaminophen] Itching     Physical Exam:   Vitals  Blood pressure 136/84, pulse 78, temperature 98.8 F (37.1 C), temperature source Oral, resp. rate (!) 22, SpO2 94 %.  1.  General: Appears in no acute distress, lethargic  2. Psychiatric:  Intact judgement and  insight, awake alert, oriented x 3.  3. Neurologic: No focal neurological deficits, all cranial nerves intact.Strength 5/5 all 4 extremities, sensation intact all 4 extremities, plantars down going.  4. Eyes :  anicteric sclerae, moist conjunctivae with no lid lag. PERRLA.  5. ENMT:  Oropharynx clear with moist mucous membranes and good dentition  6. Neck:  supple, no cervical lymphadenopathy appriciated, No thyromegaly  7. Respiratory : Normal respiratory effort, good air movement bilaterally,clear to  auscultation bilaterally  8. Cardiovascular : RRR, no gallops, rubs or murmurs, no leg edema  9. Gastrointestinal:  Positive bowel sounds, abdomen soft, non-tender to palpation,no hepatosplenomegaly, no rigidity or guarding       10. Skin:  No cyanosis, normal texture and turgor, no rash, lesions or ulcers  11.Musculoskeletal:  Good muscle tone,  joints appear normal , no effusions,  normal range of motion    Data Review:    CBC  Recent Labs Lab 11/09/16 0935  11/09/16 1524 11/10/16 0531 11/11/16 0527 11/12/16 0624 11/14/16 1446  WBC 10.5 11.6* 13.2* 11.1* 8.2 9.4  HGB 10.4* 9.1* 9.2* 10.1* 10.5* 12.6  HCT 31.3* 26.5* 27.9* 30.5* 31.4* 37.2  PLT 213 183 198 225 239 394  MCV 88.7 88.9 88.9 88.2 87.2 87.7  MCH 29.5 30.5 29.3 29.2 29.2 29.7  MCHC 33.2 34.3 33.0 33.1 33.4 33.9  RDW 13.0 13.5 14.0 13.7 13.8 13.5  LYMPHSABS 1.2  --   --   --   --  3.3  MONOABS 0.9  --   --   --   --  0.9  EOSABS 0.0  --   --   --   --  0.0  BASOSABS 0.0  --   --   --   --  0.0   ------------------------------------------------------------------------------------------------------------------  Chemistries   Recent Labs Lab 11/09/16 0935 11/09/16 1524 11/10/16 0531 11/11/16 0527 11/14/16 1446  NA 137  --  142 144 143  K 3.5  --  3.6 3.9 3.9  CL 103  --  112* 110 106  CO2 26  --  24 25 21*  GLUCOSE 109*  --  92 95 90  BUN 10  --  5* 6 12  CREATININE 0.67 0.57 0.54 0.63 0.57  CALCIUM 8.6*  --  7.4* 8.7* 9.5  AST 45*  --   --   --  55*  ALT 38  --   --   --  40  ALKPHOS 63  --   --   --  78  BILITOT 0.7  --   --   --  0.8   ------------------------------------------------------------------------------------------------------------------  ------------------------------------------------------------------------------------------------------------------ GFR: Estimated Creatinine Clearance: 75.4 mL/min (by C-G formula based on  SCr of 0.57 mg/dL). Liver Function Tests:  Recent Labs Lab 11/09/16 0935 11/14/16 1446  AST 45* 55*  ALT 38 40  ALKPHOS 63 78  BILITOT 0.7 0.8  PROT 6.8 8.4*  ALBUMIN 3.9 4.4    --------------------------------------------------------------------------------------------------------------- Urine analysis:    Component Value Date/Time   COLORURINE YELLOW 11/09/2016 [place] 11/09/2016 0952   APPEARANCEUR Clear 04/09/2015 1101   LABSPEC 1.015 11/09/2016 0952   PHURINE 7.5 11/09/2016 [place] 11/09/2016 [place] 11/09/2016 0952   BILIRUBINUR NEGATIVE 11/09/2016 0952   BILIRUBINUR Negative 04/09/2015 [place] 11/09/2016 0952   PROTEINUR NEGATIVE 11/09/2016 0952   UROBILINOGEN negative 04/19/2014 1332   UROBILINOGEN 0.2 12/04/2013 1414   NITRITE NEGATIVE 11/09/2016 0952   LEUKOCYTESUR NEGATIVE 11/09/2016 0952   LEUKOCYTESUR Negative 04/09/2015 1101      Imaging Results:    Dg Chest 2 View  Result Date: 11/14/2016 CLINICAL DATA:  Seizure today.  Disoriented. EXAM: CHEST  2 VIEW COMPARISON:  11/09/2016.  03/28/2016. FINDINGS: Poor inspiration. Mild cardiomegaly. Patchy bilateral pulmonary infiltrates right more than left consistent with pneumonia. This is slightly improved on the right and slightly worsened on the left compared to the previous study. No effusions. No acute bone finding. IMPRESSION: Patchy bilateral lower lobe pneumonia, slightly improved on the right and slightly worsened on the left compared to the study of 10/28. Poor inspiration today however. Electronically Signed   By: Nelson Chimes M.D.   On: 11/14/2016 15:36    My personal review of EKG: Rhythm NSR, QTC 559   Assessment & Plan:    Active Problems:   Seizure ([place])   1. Seizure-patient presented with breakthrough seizure, received 1.5 g Keppra in the ED.  Continue Keppra 1500 mg p.o. twice daily.  Continue Lamictal.  Will start Ativan 2 mg IV every 4 hours as needed for seizure.  Monitor patient closely in stepdown unit. 2. Healthcare associated pneumonia-chest x-ray which showed persistent left lower lobe infiltrate.  Continue vancomycin and cefepime.  Follow lactic acid.  Follow blood culture results. 3. Anxiety/depression-continue Prozac, Remeron. 4. Prolonged QTC-QTC is prolonged 559, will monitor closely on telemetry.  Check serum magnesium.    DVT Prophylaxis-   SCDs  AM Labs Ordered, also please review Full Orders  Family Communication:  Admission, patients condition and plan of care including tests being ordered have been discussed with the patient and her family members at bedside including mother, brother and sister-in-law who indicate understanding and agree with the plan and Code Status.  Code Status: Full code  Admission status: Inpatient  Time spent in minutes : 60 min   LAMA,GAGAN S M.D on 11/14/2016 at 5:28 PM  Between 7am to 7pm - Pager - [phone]. After 7pm go to [email or web address] - password [facility]  [facility] - Office  [phone]

## 2016-11-14 NOTE — ED Provider Notes (Signed)
[facility] DEPT Provider Note   CSN: [id number] Arrival date & time: 11/14/16  1414     History   Chief Complaint Chief Complaint  Patient presents with  . Altered Mental Status  . Seizures    HPI [name] is a 51 y.o. female.  HPI   51 year old female with extensive past medical history as below who presents with altered mental status.  The patient was just admitted and discharged on 10/31 following admission for pneumonia.  She reportedly has not recovered since then.  Over the last 24 hours, she has had at least 2 witnessed seizures.  She does have a history of seizure disorder.  She reportedly did not take her medications because she did not feel well last night.  She has not returned to her baseline since her most recent seizure.  She complains of shortness of breath and mild headache.  She has had persistent cough and sputum production.  No known fevers.  Her appetite is been poor.  Family does believe she has been taking her antibiotics.  She currently lives at home with her husband and son, but her husband is currently hospitalized and her son recently recovered from back surgery.  She is confused but denies any focal numbness or weakness at this time.  Past Medical History:  Diagnosis Date  . Arthritis    "knees" (04/22/2012)  . Bronchitis   . Chronic bronchitis ([place])    "yearly; when the weather changes" (04/22/2012)  . Chronic kidney disease    kidney stones  . Colon polyps    adenomatous and hyperplastic-  . Constipation   . Depression   . Eczema   . Epilepsy ([place])    "been having them right often here lately" (04/22/2012)  . Fatty liver   . EPPIRJJO(841.6)    "1/wk" (04/22/2012)  . High cholesterol   . History of kidney stones   . Osteoarthritis    [name] 04/22/2012  . Other convulsions 05/21/12   non-epileptic spells  . Rectal bleed    in toilet- bright red  . Restless leg   . Seizures ([place])   . Vertigo     Patient  Active Problem List   Diagnosis Date Noted  . CAP (community acquired pneumonia) 11/09/2016  . 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 Moderate episode of recurrent major depressive disorder ([place]) 12/25/2015  . Bereavement 12/25/2015  . HCAP (healthcare-associated pneumonia) 11/28/2015  . Multifocal pneumonia 11/28/2015  . Partial symptomatic epilepsy with complex partial seizures, not intractable, with status epilepticus ([place]) 07/24/2015  . Insomnia w/ sleep apnea 07/24/2015  . OA (osteoarthritis) of knee 05/07/2015  . Memory loss 02/20/2014  . Hypokalemia 06/23/2011  . Moderate major depression ([place]) 10/14/2010    Past Surgical History:  Procedure Laterality Date  . ABDOMINAL HYSTERECTOMY  2001  . BLADDER SUSPENSION    . BUNIONECTOMY Left 2000  . CESAREAN SECTION  1987; 1988  . COLONOSCOPY    . JOINT REPLACEMENT    . MASS EXCISION  10/22/2011   Procedure: EXCISION MASS;  Surgeon: [name], MD;  Location: [facility];  Service: General;  Laterality: Right;  excision right buttock mass  . TOTAL HIP ARTHROPLASTY Left 1993; 1995; 2000  . TOTAL KNEE ARTHROPLASTY Left 05/07/2015   Procedure: TOTAL LEFT KNEE ARTHROPLASTY;  Surgeon: [name], MD;  [facility];  Service: Orthopedics;  Laterality: Left;  . TOTAL KNEE ARTHROPLASTY Right 10/01/2015   Procedure: RIGHT TOTAL KNEE ARTHROPLASTY;  Surgeon: Pilar Plate  Aluisio, MD;  [facility];  Service: Orthopedics;  Laterality: Right;    OB History    No data available       Home Medications    Prior to Admission medications   Medication Sig Start Date End Date Taking? Authorizing Provider  clonazePAM (KLONOPIN) 0.5 MG tablet Take 1 tablet (0.5 mg total) by mouth daily as needed for anxiety. 10/20/16  Yes Norman Clay, MD  cyclobenzaprine (FLEXERIL) 10 MG tablet Take 1 tablet (10 mg total) by mouth 3 (three) times daily as needed for muscle spasms. 09/05/16  Yes [name], PA-C  FLUoxetine HCl 60 MG TABS Take 60 mg by mouth  daily. 10/20/16  Yes Norman Clay, MD  gabapentin (NEURONTIN) 600 MG tablet TAKE 1 TABLET BY MOUTH THREE TIMES DAILY 08/18/16  Yes [name], PA-C  lamoTRIgine (LAMICTAL) 100 MG tablet Take 1 tablet (100 mg total) by mouth 2 (two) times daily. 04/09/16  Yes Dohmeier, Asencion Partridge, MD  levETIRAcetam (KEPPRA) 500 MG tablet Take 3 tablets (1,500 mg total) by mouth 2 (two) times daily. 04/09/16  Yes Dohmeier, Asencion Partridge, MD  mirtazapine (REMERON) 45 MG tablet Take 1 tablet (45 mg total) by mouth at bedtime. 10/20/16  Yes Hisada, Elie Goody, MD  pramipexole (MIRAPEX) 0.5 MG tablet TAKE ONE TABLET BY MOUTH TWICE DAILY 08/27/16  Yes [name], MD  traZODone (DESYREL) 150 MG tablet Take 1 tablet (150 mg total) by mouth at bedtime. 05/19/16  Yes [name], PA-C  cefdinir (OMNICEF) 300 MG capsule Take 1 capsule (300 mg total) by mouth 2 (two) times daily. Patient not taking: Reported on 11/14/2016 11/12/16   Patrecia Pour, MD  fluticasone [facility]) 50 MCG/ACT nasal spray Place 2 sprays into both nostrils daily. Patient taking differently: Place 2 sprays into both nostrils daily as needed for allergies.  11/07/16   Dettinger, Fransisca Kaufmann, MD  loratadine (CLARITIN) 10 MG tablet Take 1 tablet (10 mg total) by mouth daily. 11/07/16   Dettinger, Fransisca Kaufmann, MD    Family History Family History  Problem Relation Age of Onset  . Cancer Mother   . Depression Father   . Cancer Brother   . Diabetes Brother   . Cancer Maternal Aunt   . Diabetes Maternal Aunt   . Bipolar disorder Son   . Drug abuse Son     Social History Social History  Substance Use Topics  . Smoking status: Never Smoker  . Smokeless tobacco: Never Used  . Alcohol use No     Comment: 12-25-2015 per pt no     Allergies   Acetaminophen; Benadryl [diphenhydramine hcl]; Codeine; Dilantin [phenytoin sodium extended]; Melatonin; Ultram [tramadol hcl]; Vimpat [lacosamide]; Betadine [povidone iodine]; Latex; Penicillins; Sulfa antibiotics; Tape; and  Vicodin [hydrocodone-acetaminophen]   Review of Systems Review of Systems  Constitutional: Positive for fatigue.  Respiratory: Positive for cough and shortness of breath.   Neurological: Positive for seizures.  Psychiatric/Behavioral: Positive for confusion.  All other systems reviewed and are negative.    Physical Exam Updated Vital Signs BP 136/84   Pulse 78   Temp 98.8 F (37.1 C) (Oral)   Resp (!) 22   SpO2 94%   Physical Exam  Constitutional: She appears well-developed and well-nourished. No distress.  HENT:  Head: Normocephalic and atraumatic.  No oral trauma  Eyes: Conjunctivae are normal.  Neck: Neck supple.  Cardiovascular: Normal rate, regular rhythm and normal heart sounds.  Exam reveals no friction rub.   No murmur heard. Pulmonary/Chest:  Effort normal. No respiratory distress. She has decreased breath sounds. She has wheezes. She has rhonchi in the right lower field and the left lower field. She has rales in the right lower field and the left lower field.  Abdominal: She exhibits no distension.  Musculoskeletal: She exhibits no edema.  Neurological: She is alert. She exhibits normal muscle tone.  Oriented to person only.  Face is symmetric.  Moves all extremities with out of 5 strength.  Normal sensation light touch.  Skin: Skin is warm. Capillary refill takes less than 2 seconds.  Psychiatric: She has a normal mood and affect.  Nursing note and vitals reviewed.    ED Treatments / Results  Labs (all labs ordered are listed, but only abnormal results are displayed) Labs Reviewed  COMPREHENSIVE METABOLIC PANEL - Abnormal; Notable for the following:       Result Value   CO2 21 (*)    Total Protein 8.4 (*)    AST 55 (*)    Anion gap 16 (*)    All other components within normal limits  BLOOD GAS, VENOUS - Abnormal; Notable for the following:    pCO2, Ven 37.3 (*)    pO2, Ven 48.4 (*)    All other components within normal limits  I-STAT CG4 LACTIC ACID,  ED - Abnormal; Notable for the following:    Lactic Acid, Venous 2.34 (*)    All other components within normal limits  CULTURE, BLOOD (ROUTINE X 2)  CULTURE, BLOOD (ROUTINE X 2)  CBC WITH DIFFERENTIAL/PLATELET  RAPID URINE DRUG SCREEN, HOSP PERFORMED    EKG  EKG Interpretation  Date/Time:  Friday November 14 2016 14:44:27 EDT Ventricular Rate:  89 PR Interval:    QRS Duration: 103 QT Interval:  459 QTC Calculation: 559 R Axis:   55 Text Interpretation:  Sinus rhythm Borderline T abnormalities, anterior leads Prolonged QT interval No significant change since last tracing Confirmed by Duffy Bruce [id number]) on 11/14/2016 5:04:30 PM       Radiology Dg Chest 2 View  Result Date: 11/14/2016 CLINICAL DATA:  Seizure today.  Disoriented. EXAM: CHEST  2 VIEW COMPARISON:  11/09/2016.  03/28/2016. FINDINGS: Poor inspiration. Mild cardiomegaly. Patchy bilateral pulmonary infiltrates right more than left consistent with pneumonia. This is slightly improved on the right and slightly worsened on the left compared to the previous study. No effusions. No acute bone finding. IMPRESSION: Patchy bilateral lower lobe pneumonia, slightly improved on the right and slightly worsened on the left compared to the study of 10/28. Poor inspiration today however. Electronically Signed   By: Nelson Chimes M.D.   On: 11/14/2016 15:36    Procedures Procedures (including critical care time)  Medications Ordered in ED Medications  sodium chloride 0.9 % bolus 1,000 mL (1,000 mLs Intravenous New Bag/Given 11/14/16 1644)  ceFEPIme (MAXIPIME) 2 g in dextrose 5 % 50 mL IVPB (not administered)  vancomycin (VANCOCIN) IVPB 1000 mg/200 mL premix (not administered)  levETIRAcetam (KEPPRA) 1,500 mg in sodium chloride 0.9 % 100 mL IVPB (1,500 mg Intravenous New Bag/Given 11/14/16 1644)  lamoTRIgine (LAMICTAL) tablet 100 mg (100 mg Oral Given 11/14/16 1644)     Initial Impression / Assessment and Plan / ED Course  I have  reviewed the triage vital signs and the nursing notes.  Pertinent labs & imaging results that were available during my care of the patient were reviewed by me and considered in my medical decision making (see chart for details).     51 year old female  here with persistent shortness of breath and recurrent seizures after recent admission and diagnosis of community acquired pneumonia.  Patient is hypoxic to 86 here on room air.  Concern for persistent hypoxia and pneumonia with subsequent breakthrough seizures secondary to infection as well as possible medication nonadherence.  Will load with Keppra, start broad-spectrum antibiotics given recent hospitalization, and admit to medicine.  Final Clinical Impressions(s) / ED Diagnoses   Final diagnoses:  Hypoxia  Breakthrough seizure ([place])  HCAP (healthcare-associated pneumonia)    New Prescriptions New Prescriptions   No medications on file     Duffy Bruce, MD 11/14/16 1705

## 2016-11-15 LAB — CBC
HCT: 32.8 % — ABNORMAL LOW (ref 36.0–46.0)
Hemoglobin: 10.9 g/dL — ABNORMAL LOW (ref 12.0–15.0)
MCH: 29.3 pg (ref 26.0–34.0)
MCHC: 33.2 g/dL (ref 30.0–36.0)
MCV: 88.2 fL (ref 78.0–100.0)
Platelets: 328 10*3/uL (ref 150–400)
RBC: 3.72 MIL/uL — ABNORMAL LOW (ref 3.87–5.11)
RDW: 13.9 % (ref 11.5–15.5)
WBC: 6 10*3/uL (ref 4.0–10.5)

## 2016-11-15 LAB — RAPID URINE DRUG SCREEN, HOSP PERFORMED
Amphetamines: NOT DETECTED
Barbiturates: NOT DETECTED
Benzodiazepines: NOT DETECTED
Cocaine: NOT DETECTED
Opiates: POSITIVE — AB
Tetrahydrocannabinol: NOT DETECTED

## 2016-11-15 LAB — COMPREHENSIVE METABOLIC PANEL
ALT: 48 U/L (ref 14–54)
AST: 68 U/L — ABNORMAL HIGH (ref 15–41)
Albumin: 3.5 g/dL (ref 3.5–5.0)
Alkaline Phosphatase: 57 U/L (ref 38–126)
Anion gap: 9 (ref 5–15)
BUN: 12 mg/dL (ref 6–20)
CO2: 25 mmol/L (ref 22–32)
Calcium: 8.5 mg/dL — ABNORMAL LOW (ref 8.9–10.3)
Chloride: 110 mmol/L (ref 101–111)
Creatinine, Ser: 0.67 mg/dL (ref 0.44–1.00)
GFR calc Af Amer: >60 mL/min (ref >60)
GFR calc non Af Amer: >60 mL/min (ref >60)
Glucose, Bld: 85 mg/dL (ref 65–99)
Potassium: 3.7 mmol/L (ref 3.5–5.1)
Sodium: 144 mmol/L (ref 135–145)
Total Bilirubin: 0.7 mg/dL (ref 0.3–1.2)
Total Protein: 6.7 g/dL (ref 6.5–8.1)

## 2016-11-15 LAB — MRSA PCR SCREENING: MRSA by PCR: NEGATIVE

## 2016-11-15 MED ORDER — ENOXAPARIN SODIUM 40 MG/0.4ML ~~LOC~~ SOLN
40.0000 mg | SUBCUTANEOUS | Status: DC
  Administered 2016-11-15 – 2016-11-16 (×2): 40 mg via SUBCUTANEOUS
  Filled 2016-11-15 (×2): qty 0.4

## 2016-11-15 MED ORDER — ACETAMINOPHEN 325 MG PO TABS
650.0000 mg | ORAL_TABLET | Freq: Four times a day (QID) | ORAL | Status: DC | PRN
  Administered 2016-11-15 – 2016-11-16 (×2): 650 mg via ORAL
  Filled 2016-11-15 (×2): qty 2

## 2016-11-15 MED ORDER — FLUOXETINE HCL 20 MG PO CAPS
60.0000 mg | ORAL_CAPSULE | Freq: Every day | ORAL | Status: DC
  Administered 2016-11-15 – 2016-11-17 (×3): 60 mg via ORAL
  Filled 2016-11-15 (×3): qty 3

## 2016-11-15 MED ORDER — GABAPENTIN 300 MG PO CAPS
600.0000 mg | ORAL_CAPSULE | Freq: Three times a day (TID) | ORAL | Status: DC
  Administered 2016-11-15 – 2016-11-17 (×7): 600 mg via ORAL
  Filled 2016-11-15 (×7): qty 2

## 2016-11-15 MED ORDER — PRAMIPEXOLE DIHYDROCHLORIDE 0.25 MG PO TABS
0.5000 mg | ORAL_TABLET | Freq: Two times a day (BID) | ORAL | Status: DC
  Administered 2016-11-15 – 2016-11-17 (×5): 0.5 mg via ORAL
  Filled 2016-11-15 (×5): qty 2

## 2016-11-15 MED ORDER — MIRTAZAPINE 15 MG PO TABS
45.0000 mg | ORAL_TABLET | Freq: Every day | ORAL | Status: DC
  Administered 2016-11-15 – 2016-11-16 (×2): 45 mg via ORAL
  Filled 2016-11-15 (×2): qty 3

## 2016-11-15 MED ORDER — ONDANSETRON HCL 4 MG PO TABS: 4.0000 mg | ORAL_TABLET | Freq: Four times a day (QID) | ORAL | Status: DC | PRN

## 2016-11-15 MED ORDER — LEVETIRACETAM 500 MG PO TABS
1500.0000 mg | ORAL_TABLET | Freq: Two times a day (BID) | ORAL | Status: DC
  Administered 2016-11-15 – 2016-11-17 (×5): 1500 mg via ORAL
  Filled 2016-11-15 (×5): qty 3

## 2016-11-15 MED ORDER — LORAZEPAM 2 MG/ML IJ SOLN: 2.0000 mg | INTRAMUSCULAR | Status: DC | PRN

## 2016-11-15 MED ORDER — SODIUM CHLORIDE 0.9 % IV SOLN
INTRAVENOUS | Status: DC
  Administered 2016-11-15: 05:00:00 via INTRAVENOUS

## 2016-11-15 MED ORDER — ONDANSETRON HCL 4 MG/2ML IJ SOLN: 4.0000 mg | Freq: Four times a day (QID) | INTRAMUSCULAR | Status: DC | PRN

## 2016-11-15 MED ORDER — LAMOTRIGINE 100 MG PO TABS
100.0000 mg | ORAL_TABLET | Freq: Two times a day (BID) | ORAL | Status: DC
  Administered 2016-11-15 – 2016-11-17 (×5): 100 mg via ORAL
  Filled 2016-11-15 (×5): qty 1

## 2016-11-15 MED ORDER — LORATADINE 10 MG PO TABS
10.0000 mg | ORAL_TABLET | Freq: Every day | ORAL | Status: DC
  Administered 2016-11-15 – 2016-11-17 (×3): 10 mg via ORAL
  Filled 2016-11-15 (×3): qty 1

## 2016-11-15 NOTE — Progress Notes (Signed)
Assisted patient to [facility] patient unable to void. Bladder scan performed and showed 760 ml in bladder. Dr Darrick Meigs Notified

## 2016-11-15 NOTE — Progress Notes (Signed)
[facility]  PROGRESS NOTE  [name] MEQ:[id number] DOB: 1965-06-08 DOA: 11/14/2016 PCP: [name], MD   Brief HPI:   51 y.o. female, with history of seizures, restless leg syndrome, kidney stone, CKD stage III who was recently discharged from the hospital on 11/12/2016 after she was treated for lobar pneumonia.  Patient has been taking her medications as prescribed.  Last night she did not sleep as her husband is currently admitted at [facility] in ICU.  This morning patient had a seizure and was postictal until noontime.  When she continued to be confused patient's family got concerned and brought her to hospital for further evaluation. Patient had another seizure in the hospital, was given 1500 milligrams IV Keppra x1. Chest x-ray showed p slightly improved right and worsening on the left bilateral lower lobe pneumonia. Patient given vancomycin and cefepime in the hospital Lactic acid elevated to 2.34.    Subjective   Patient seen and examined, somnolent but arousable.  Denies chest pain or shortness of breath.  No seizures overnight.   Assessment/Plan:     1. Seizure-patient came with breakthrough seizure, received 1.5 g In the ED.  Currently she is on Keppra 1500 mg p.o. twice daily.  Continue Lamictal.  Ativan as needed. 2. Healthcare associated pneumonia-chest x-ray showed persistent left lower lobe infiltrate.  Patient started on vancomycin and cefepime.  Blood cultures have been obtained.  Will follow the results.  Lactic acid is down to 1.59 3. Anxiety/depression-continue Prozac, Remeron. 4. Prolonged QTC-patient came with prolonged QTC 559.  Continue monitoring on telemetry.  Serum magnesium is 2.5.  Will repeat EKG today.    DVT prophylaxis: Lovenox, SCDs  Code Status: Full code  Family Communication: No family at bedside  Disposition Plan: Likely home in the next 2-3 days   Consultants:  None  Procedures:  None  Continuous infusions .  sodium chloride 10 mL/hr at 11/15/16 0430  . ceFEPime (MAXIPIME) IV Stopped (11/15/16 1142)  . vancomycin 1,250 mg (11/15/16 1220)      Antibiotics:   Anti-infectives    Start     Dose/Rate Route Frequency Ordered Stop   11/15/16 0800  vancomycin (VANCOCIN) 1,250 mg in sodium chloride 0.9 % 250 mL IVPB     1,250 mg 166.7 mL/hr over 90 Minutes Intravenous Every 24 hours 11/14/16 2241     11/15/16 0200  ceFEPIme (MAXIPIME) 1 g in dextrose 5 % 50 mL IVPB     1 g 100 mL/hr over 30 Minutes Intravenous Every 8 hours 11/14/16 2240     11/14/16 1700  vancomycin (VANCOCIN) IVPB 1000 mg/200 mL premix     1,000 mg 200 mL/hr over 60 Minutes Intravenous  Once 11/14/16 1614 11/14/16 1950   11/14/16 1615  ceFEPIme (MAXIPIME) 2 g in dextrose 5 % 50 mL IVPB     2 g 100 mL/hr over 30 Minutes Intravenous  Once 11/14/16 1614 11/14/16 1807       Objective   Vitals:   11/15/16 0000 11/15/16 0004 11/15/16 0350 11/15/16 0800  BP: 119/81   (!) 154/95  Pulse: 71   (!) 58  Resp: 11   17  Temp:  97.8 F (36.6 C) 97.6 F (36.4 C) 98 F (36.7 C)  TempSrc:  Oral Oral Oral  SpO2: 98%   99%  Weight:   72.5 kg (159 lb 13.3 oz)   Height:       No intake or output data in the 24 hours ending 11/15/16  [place]   11/14/16 2033 11/15/16 0350  Weight: 72.5 kg (159 lb 13.3 oz) 72.5 kg (159 lb 13.3 oz)     Physical Examination:   Physical Exam: Eyes: No icterus, extraocular muscles intact  Mouth: Oral mucosa is moist, no lesions on palate,  Neck: Supple, no deformities, masses, or tenderness Lungs: Normal respiratory effort, bilateral clear to auscultation, no crackles or wheezes.  Heart: Regular rate and rhythm, S1 and S2 normal, no murmurs, rubs auscultated Abdomen: BS normoactive,soft,nondistended,non-tender to palpation,no organomegaly Extremities: No pretibial edema, no erythema, no cyanosis, no clubbing Neuro : Somnolent and oriented to time, place and person, No focal  deficits  Skin: No rashes seen on exam       Data Reviewed: I have personally reviewed following labs and imaging studies  CBG: No results for input(s): GLUCAP in the last 168 hours.  CBC:  Recent Labs Lab 11/09/16 0935  11/10/16 0531 11/11/16 0527 11/12/16 0624 11/14/16 1446 11/15/16 0645  WBC 10.5  < > 13.2* 11.1* 8.2 9.4 6.0  NEUTROABS 8.3*  --   --   --   --  5.2  --   HGB 10.4*  < > 9.2* 10.1* 10.5* 12.6 10.9*  HCT 31.3*  < > 27.9* 30.5* 31.4* 37.2 32.8*  MCV 88.7  < > 88.9 88.2 87.2 87.7 88.2  PLT 213  < > 198 225 239 394 328  < > = values in this interval not displayed.  Basic Metabolic Panel:  Recent Labs Lab 11/09/16 0935 11/09/16 1524 11/10/16 0531 11/11/16 0527 11/14/16 1445 11/14/16 1446 11/15/16 0645  NA 137  --  142 144  --  143 144  K 3.5  --  3.6 3.9  --  3.9 3.7  CL 103  --  112* 110  --  106 110  CO2 26  --  24 25  --  21* 25  GLUCOSE 109*  --  92 95  --  90 85  BUN 10  --  5* 6  --  12 12  CREATININE 0.67 0.57 0.54 0.63  --  0.57 0.67  CALCIUM 8.6*  --  7.4* 8.7*  --  9.5 8.5*  MG  --   --   --   --  2.5*  --   --     Recent Results (from the past 240 hour(s))  Blood Culture (routine x 2)     Status: None   Collection Time: 11/09/16  9:35 AM  Result Value Ref Range Status   Specimen Description BLOOD LEFT FOREARM  Final   Special Requests   Final    BOTTLES DRAWN AEROBIC AND ANAEROBIC Blood Culture adequate volume   Culture   Final    NO GROWTH 5 DAYS Performed at [facility], [address], [place], [place] [address]    Report Status 11/14/2016 FINAL  Final  Blood Culture (routine x 2)     Status: None   Collection Time: 11/09/16  9:40 AM  Result Value Ref Range Status   Specimen Description BLOOD RIGHT FOREARM  Final   Special Requests   Final    BOTTLES DRAWN AEROBIC AND ANAEROBIC Blood Culture adequate volume   Culture   Final    NO GROWTH 5 DAYS Performed at [facility], [employer] [address], [place],  [place] [address]    Report Status 11/14/2016 FINAL  Final  Urine culture     Status: Abnormal   Collection Time: 11/09/16  9:52 AM  Result Value Ref Range Status   Specimen Description URINE, RANDOM  Final   Special Requests NONE  Final   Culture MULTIPLE SPECIES PRESENT, SUGGEST RECOLLECTION (A)  Final   Report Status 11/10/2016 FINAL  Final  MRSA PCR Screening     Status: None   Collection Time: 11/15/16 12:25 AM  Result Value Ref Range Status   MRSA by PCR NEGATIVE NEGATIVE Final    Comment:        The GeneXpert MRSA Assay (FDA approved for NASAL specimens only), is one component of a comprehensive MRSA colonization surveillance program. It is not intended to diagnose MRSA infection nor to guide or monitor treatment for MRSA infections.      Liver Function Tests:  Recent Labs Lab 11/09/16 0935 11/14/16 1446 11/15/16 0645  AST 45* 55* 68*  ALT 38 40 48  ALKPHOS 63 78 57  BILITOT 0.7 0.8 0.7  PROT 6.8 8.4* 6.7  ALBUMIN 3.9 4.4 3.5   No results for input(s): LIPASE, AMYLASE in the last 168 hours. No results for input(s): AMMONIA in the last 168 hours.  Cardiac Enzymes: No results for input(s): CKTOTAL, CKMB, CKMBINDEX, TROPONINI in the last 168 hours. BNP (last 3 results) No results for input(s): BNP in the last 8760 hours.  ProBNP (last 3 results) No results for input(s): PROBNP in the last 8760 hours.    Studies: Dg Chest 2 View  Result Date: 11/14/2016 CLINICAL DATA:  Seizure today.  Disoriented. EXAM: CHEST  2 VIEW COMPARISON:  11/09/2016.  03/28/2016. FINDINGS: Poor inspiration. Mild cardiomegaly. Patchy bilateral pulmonary infiltrates right more than left consistent with pneumonia. This is slightly improved on the right and slightly worsened on the left compared to the previous study. No effusions. No acute bone finding. IMPRESSION: Patchy bilateral lower lobe pneumonia, slightly improved on the right and slightly worsened on the left compared to the study of  10/28. Poor inspiration today however. Electronically Signed   By: Nelson Chimes M.D.   On: 11/14/2016 15:36   Ct Head Wo Contrast  Result Date: 11/14/2016 CLINICAL DATA:  2 seizure episodes this morning. Altered level of consciousness since. EXAM: CT HEAD WITHOUT CONTRAST TECHNIQUE: Contiguous axial images were obtained from the base of the skull through the vertex without intravenous contrast. COMPARISON:  CT head without contrast 01/23/2015 FINDINGS: Brain: No acute infarct, hemorrhage, or mass lesion is present. The ventricles are of normal size. No focal lesion is present to explain seizures. No significant extraaxial fluid collection is present. Vascular: Faint atherosclerotic calcifications are present within the cavernous internal carotid arteries. There is no hyperdense vessel. Skull: The calvarium is intact. No focal lytic or blastic lesions are present. Sinuses/Orbits: The paranasal sinuses and mastoid air cells are clear. The visualized orbits are within normal limits. IMPRESSION: Negative CT of the head. Electronically Signed   By: San Morelle M.D.   On: 11/14/2016 19:43    Scheduled Meds: . FLUoxetine  60 mg Oral Daily  . gabapentin  600 mg Oral TID  . lamoTRIgine  100 mg Oral BID  . levETIRAcetam  1,500 mg Oral BID  . loratadine  10 mg Oral Daily  . mirtazapine  45 mg Oral QHS  . pramipexole  0.5 mg Oral BID      Time spent: 20 minutes  [facility] Pager [phone]. If 7PM-7AM, please contact night-coverage at [email or web address], Office  [phone]  password [email or web address]  11/15/2016, 1:18 PM  LOS: 1 day

## 2016-11-16 ENCOUNTER — Other Ambulatory Visit: Payer: Self-pay

## 2016-11-16 NOTE — Progress Notes (Signed)
[facility]  PROGRESS NOTE  [name] NKN:[id number] DOB: 1965/12/07 DOA: 11/14/2016 PCP: [name], MD   Brief HPI:   51 y.o. female, with history of seizures, restless leg syndrome, kidney stone, CKD stage III who was recently discharged from the hospital on 11/12/2016 after she was treated for lobar pneumonia.  Patient has been taking her medications as prescribed.  Last night she did not sleep as her husband is currently admitted at [facility] in ICU.  This morning patient had a seizure and was postictal until noontime.  When she continued to be confused patient's family got concerned and brought her to hospital for further evaluation. Patient had another seizure in the hospital, was given 1500 milligrams IV Keppra x1. Chest x-ray showed p slightly improved right and worsening on the left bilateral lower lobe pneumonia. Patient given vancomycin and cefepime in the hospital Lactic acid elevated to 2.34.    Subjective   Patient seen and examined, denies any complaints.  Denies chest pain or shortness of breath.  No more seizures in the hospital.   Assessment/Plan:     1. Seizure-seizures are currently well controlled, patient came with breakthrough seizure, received 1.5 g In the ED.  Currently she is on Keppra 1500 mg p.o. twice daily.  Continue Lamictal.  Ativan as needed. 2. Healthcare associated pneumonia-chest x-ray showed persistent left lower lobe infiltrate.  Patient started on vancomycin and cefepime.  Blood cultures have been obtained.   Lactic acid is down to 1.59.  Follow blood culture results. 3. Anxiety/depression-continue Prozac, Remeron. 4. Prolonged QTC-resolved, patient came with prolonged QTC 559.  Repeat EKG showed QTC 480.  Continue monitoring on telemetry.  Serum magnesium is 2.5.      DVT prophylaxis: Lovenox, SCDs  Code Status: Full code  Family Communication: No family at bedside  Disposition Plan: Likely home in  am   Consultants:  None  Procedures:  None  Continuous infusions . sodium chloride 10 mL/hr at 11/16/16 0600  . ceFEPime (MAXIPIME) IV Stopped (11/16/16 1105)  . vancomycin Stopped (11/16/16 1103)      Antibiotics:   Anti-infectives (From admission, onward)   Start     Dose/Rate Route Frequency Ordered Stop   11/15/16 0800  vancomycin (VANCOCIN) 1,250 mg in sodium chloride 0.9 % 250 mL IVPB     1,250 mg 166.7 mL/hr over 90 Minutes Intravenous Every 24 hours 11/14/16 2241     11/15/16 0200  ceFEPIme (MAXIPIME) 1 g in dextrose 5 % 50 mL IVPB     1 g 100 mL/hr over 30 Minutes Intravenous Every 8 hours 11/14/16 2240     11/14/16 1700  vancomycin (VANCOCIN) IVPB 1000 mg/200 mL premix     1,000 mg 200 mL/hr over 60 Minutes Intravenous  Once 11/14/16 1614 11/14/16 1950   11/14/16 1615  ceFEPIme (MAXIPIME) 2 g in dextrose 5 % 50 mL IVPB     2 g 100 mL/hr over 30 Minutes Intravenous  Once 11/14/16 1614 11/14/16 1807       Objective   Vitals:   11/16/16 0500 11/16/16 0600 11/16/16 0800 11/16/16 1000  BP: 132/82 120/73 (!) 143/76 133/78  Pulse: (!) 54 (!) 54 68 66  Resp: 19 20 15 15   Temp:   98 F (36.7 C)   TempSrc:   Oral   SpO2: 98% 96% 98% 97%  Weight:      Height:        Intake/Output Summary (Last 24 hours) at 11/16/2016 1239 Last  data filed at 11/16/2016 1000 Gross per 24 hour  Intake 1475 ml  Output 2000 ml  Net -525 ml   Filed Weights   11/14/16 2033 11/15/16 0350 11/16/16 0358  Weight: 72.5 kg (159 lb 13.3 oz) 72.5 kg (159 lb 13.3 oz) 74.2 kg (163 lb 9.3 oz)     Physical Examination:  Physical Exam: Eyes: No icterus, extraocular muscles intact  Mouth: Oral mucosa is moist, no lesions on palate,  Neck: Supple, no deformities, masses, or tenderness Lungs: Normal respiratory effort, bilateral clear to auscultation, no crackles or wheezes.  Heart: Regular rate and rhythm, S1 and S2 normal, no murmurs, rubs auscultated Abdomen: BS  normoactive,soft,nondistended,non-tender to palpation,no organomegaly Extremities: No pretibial edema, no erythema, no cyanosis, no clubbing Neuro : Alert and oriented to time, place and person, No focal deficits  Skin: No rashes seen on exam     Data Reviewed: I have personally reviewed following labs and imaging studies  CBG: No results for input(s): GLUCAP in the last 168 hours.  CBC: Recent Labs  Lab 11/10/16 0531 11/11/16 0527 11/12/16 0624 11/14/16 1446 11/15/16 0645  WBC 13.2* 11.1* 8.2 9.4 6.0  NEUTROABS  --   --   --  5.2  --   HGB 9.2* 10.1* 10.5* 12.6 10.9*  HCT 27.9* 30.5* 31.4* 37.2 32.8*  MCV 88.9 88.2 87.2 87.7 88.2  PLT 198 225 239 394 818    Basic Metabolic Panel: Recent Labs  Lab 11/09/16 1524 11/10/16 0531 11/11/16 0527 11/14/16 1445 11/14/16 1446 11/15/16 0645  NA  --  142 144  --  143 144  K  --  3.6 3.9  --  3.9 3.7  CL  --  112* 110  --  106 110  CO2  --  24 25  --  21* 25  GLUCOSE  --  92 95  --  90 85  BUN  --  5* 6  --  12 12  CREATININE 0.57 0.54 0.63  --  0.57 0.67  CALCIUM  --  7.4* 8.7*  --  9.5 8.5*  MG  --   --   --  2.5*  --   --     Recent Results (from the past 240 hour(s))  Blood Culture (routine x 2)     Status: None   Collection Time: 11/09/16  9:35 AM  Result Value Ref Range Status   Specimen Description BLOOD LEFT FOREARM  Final   Special Requests   Final    BOTTLES DRAWN AEROBIC AND ANAEROBIC Blood Culture adequate volume   Culture   Final    NO GROWTH 5 DAYS Performed at [facility], [address], [place], [place] [address]    Report Status 11/14/2016 FINAL  Final  Blood Culture (routine x 2)     Status: None   Collection Time: 11/09/16  9:40 AM  Result Value Ref Range Status   Specimen Description BLOOD RIGHT FOREARM  Final   Special Requests   Final    BOTTLES DRAWN AEROBIC AND ANAEROBIC Blood Culture adequate volume   Culture   Final    NO GROWTH 5 DAYS Performed at [facility],  [place] [address] [place], [place] [address]    Report Status 11/14/2016 FINAL  Final  Urine culture     Status: Abnormal   Collection Time: 11/09/16  9:52 AM  Result Value Ref Range Status   Specimen Description URINE, RANDOM  Final   Special Requests  NONE  Final   Culture MULTIPLE SPECIES PRESENT, SUGGEST RECOLLECTION (A)  Final   Report Status 11/10/2016 FINAL  Final  Blood culture (routine x 2)     Status: None (Preliminary result)   Collection Time: 11/14/16  4:02 PM  Result Value Ref Range Status   Specimen Description BLOOD LEFT ANTECUBITAL  Final   Special Requests   Final    BOTTLES DRAWN AEROBIC AND ANAEROBIC Blood Culture adequate volume   Culture   Final    NO GROWTH < 24 HOURS Performed at [facility], [place] [address] [place],  [address]    Report Status PENDING  Incomplete  Blood culture (routine x 2)     Status: None (Preliminary result)   Collection Time: 11/14/16  4:05 PM  Result Value Ref Range Status   Specimen Description BLOOD RIGHT HAND  Final   Special Requests   Final    BOTTLES DRAWN AEROBIC AND ANAEROBIC Blood Culture adequate volume   Culture   Final    NO GROWTH < 24 HOURS Performed at [facility], [place] [address], [place],  [address]    Report Status PENDING  Incomplete  MRSA PCR Screening     Status: None   Collection Time: 11/15/16 12:25 AM  Result Value Ref Range Status   MRSA by PCR NEGATIVE NEGATIVE Final    Comment:        The GeneXpert MRSA Assay (FDA approved for NASAL specimens only), is one component of a comprehensive MRSA colonization surveillance program. It is not intended to diagnose MRSA infection nor to guide or monitor treatment for MRSA infections.      Liver Function Tests: Recent Labs  Lab 11/14/16 1446 11/15/16 0645  AST 55* 68*  ALT 40 48  ALKPHOS 78 57  BILITOT 0.8 0.7  PROT 8.4* 6.7  ALBUMIN 4.4 3.5   No results for input(s): LIPASE, AMYLASE in the last 168 hours. No results for  input(s): AMMONIA in the last 168 hours.  Cardiac Enzymes: No results for input(s): CKTOTAL, CKMB, CKMBINDEX, TROPONINI in the last 168 hours. BNP (last 3 results) No results for input(s): BNP in the last 8760 hours.  ProBNP (last 3 results) No results for input(s): PROBNP in the last 8760 hours.    Studies: Dg Chest 2 View  Result Date: 11/14/2016 CLINICAL DATA:  Seizure today.  Disoriented. EXAM: CHEST  2 VIEW COMPARISON:  11/09/2016.  03/28/2016. FINDINGS: Poor inspiration. Mild cardiomegaly. Patchy bilateral pulmonary infiltrates right more than left consistent with pneumonia. This is slightly improved on the right and slightly worsened on the left compared to the previous study. No effusions. No acute bone finding. IMPRESSION: Patchy bilateral lower lobe pneumonia, slightly improved on the right and slightly worsened on the left compared to the study of 10/28. Poor inspiration today however. Electronically Signed   By: Nelson Chimes M.D.   On: 11/14/2016 15:36   Ct Head Wo Contrast  Result Date: 11/14/2016 CLINICAL DATA:  2 seizure episodes this morning. Altered level of consciousness since. EXAM: CT HEAD WITHOUT CONTRAST TECHNIQUE: Contiguous axial images were obtained from the base of the skull through the vertex without intravenous contrast. COMPARISON:  CT head without contrast 01/23/2015 FINDINGS: Brain: No acute infarct, hemorrhage, or mass lesion is present. The ventricles are of normal size. No focal lesion is present to explain seizures. No significant extraaxial fluid collection is present. Vascular: Faint atherosclerotic calcifications are present within the cavernous internal carotid arteries. There  is no hyperdense vessel. Skull: The calvarium is intact. No focal lytic or blastic lesions are present. Sinuses/Orbits: The paranasal sinuses and mastoid air cells are clear. The visualized orbits are within normal limits. IMPRESSION: Negative CT of the head. Electronically Signed   By:  San Morelle M.D.   On: 11/14/2016 19:43    Scheduled Meds: . enoxaparin (LOVENOX) injection  40 mg Subcutaneous Q24H  . FLUoxetine  60 mg Oral Daily  . gabapentin  600 mg Oral TID  . lamoTRIgine  100 mg Oral BID  . levETIRAcetam  1,500 mg Oral BID  . loratadine  10 mg Oral Daily  . mirtazapine  45 mg Oral QHS  . pramipexole  0.5 mg Oral BID      Time spent: 20 minutes  [facility] Pager [phone]. If 7PM-7AM, please contact night-coverage at [email or web address], Office  [phone]  password [email or web address]  11/16/2016, 12:39 PM  LOS: 2 days

## 2016-11-16 NOTE — Progress Notes (Deleted)
Patient does not want foley removed now.She states it is very sore and irritated between her legs.

## 2016-11-16 NOTE — Progress Notes (Signed)
Assumed care of patient from step down. Agree with RN's prior assessment. Stable vs. Will continue to monitor closely

## 2016-11-16 NOTE — Progress Notes (Signed)
Nutrition Brief Note  Patient identified on the Malnutrition Screening Tool (MST) Report  Patient's weight is stable.   Wt Readings from Last 15 Encounters:  11/16/16 163 lb 9.3 oz (74.2 kg)  11/12/16 165 lb 12.6 oz (75.2 kg)  11/07/16 165 lb 12.8 oz (75.2 kg)  09/05/16 158 lb (71.7 kg)  07/07/16 158 lb 6.4 oz (71.8 kg)  05/19/16 156 lb 3.2 oz (70.9 kg)  05/09/16 159 lb (72.1 kg)  04/09/16 156 lb (70.8 kg)  03/28/16 155 lb (70.3 kg)  12/19/15 164 lb 9.6 oz (74.7 kg)  12/11/15 162 lb (73.5 kg)  11/30/15 163 lb 9.3 oz (74.2 kg)  11/26/15 164 lb 3.2 oz (74.5 kg)  11/21/15 163 lb 9.6 oz (74.2 kg)  10/22/15 162 lb (73.5 kg)    Body mass index is 31.95 kg/m. Patient meets criteria for obesity based on current BMI.   Current diet order is regular, patient is consuming approximately 70% of meals at this time. Labs and medications reviewed.   No nutrition interventions warranted at this time. If nutrition issues arise, please consult RD.   Monica Bibles, MS, RD, [facility] Pager: [phone] After Hours Pager: [phone]

## 2016-11-16 NOTE — Progress Notes (Signed)
Transferred to room 1436 via wheelchair. Report given to RN.

## 2016-11-17 MED ORDER — OXYCODONE HCL 5 MG PO TABS
5.0000 mg | ORAL_TABLET | Freq: Once | ORAL | Status: AC
  Administered 2016-11-17: 5 mg via ORAL
  Filled 2016-11-17: qty 1

## 2016-11-17 MED ORDER — OXYCODONE HCL 5 MG PO TABS: 5.0000 mg | ORAL_TABLET | Freq: Four times a day (QID) | ORAL | 0 refills | Status: DC | PRN

## 2016-11-17 MED ORDER — LEVOFLOXACIN 750 MG PO TABS: 750.0000 mg | ORAL_TABLET | Freq: Every day | ORAL | 0 refills | Status: AC

## 2016-11-17 NOTE — Discharge Summary (Signed)
Physician Discharge Summary  [name]:[id number] DOB: 08/17/1965 DOA: 11/14/2016  PCP: [name], MD  Admit date: 11/14/2016 Discharge date: 11/17/2016  Time spent: 25 minutes  Recommendations for Outpatient Follow-up:  1. Follow up PCP in 2 weeks  Discharge Diagnoses:  Active Problems:   Seizure [facility])   Discharge Condition: Stable  Diet recommendation: Regular diet  Filed Weights   11/14/16 2033 11/15/16 0350 11/16/16 0358  Weight: 72.5 kg (159 lb 13.3 oz) 72.5 kg (159 lb 13.3 oz) 74.2 kg (163 lb 9.3 oz)    History of present illness:  51 y.o.female,with history of seizures, restless leg syndrome, kidney stone, CKD stage III who was recently discharged from the hospital on 11/12/2016 after she was treated for lobar pneumonia. Patient has been taking her medications as prescribed. Last night she did not sleep as her husband is currently admitted at [facility] in ICU. This morning patient had a seizure and was postictal until noontime. When she continued to be confused patient's family got concerned and brought her to hospital for further evaluation. Patient had another seizure in the hospital, was given 1500 milligrams IV Keppra x1. Chest x-ray showed p slightly improved right and worsening on the left bilateral lower lobe pneumonia. Patient given vancomycin and cefepime in the hospital Lactic acid elevated to 2.34.    Hospital Course:  1. Seizure-seizures are currently well controlled, patient came with breakthrough seizure, received 1.5 g In the ED.  Currently she is on Keppra 1500 mg p.o. twice daily.  Continue Lamictal. . 2. Healthcare associated pneumonia-chest x-ray showed persistent left lower lobe infiltrate.  Patient started on vancomycin and cefepime.  Blood cultures have been negative to date.   Lactic acid is down to 1.59. Will discharge patient on Levaquin 750 mg po daily x 7 days. 3. Anxiety/depression-continue Prozac, Remeron. 4. Prolonged  QTC-resolved, patient came with prolonged QTC 559.  Repeat EKG showed QTC 480.   Serum magnesium is 2.5.       Procedures:  None   Consultations:  None   Discharge Exam: Vitals:   11/16/16 2126 11/17/16 0549  BP: 134/86 122/78  Pulse: (!) 57 (!) 56  Resp: 20 18  Temp: 98.1 F (36.7 C) 97.9 F (36.6 C)  SpO2: 99% 97%    General: Appears in no acute distress Cardiovascular: S1S2 RRR Respiratory: Clear bilaterally  Discharge Instructions    Current Discharge Medication List    START taking these medications   Details  levofloxacin (LEVAQUIN) 750 MG tablet Take 1 tablet (750 mg total) daily for 7 days by mouth. Qty: 7 tablet, Refills: 0    oxyCODONE (OXY IR/ROXICODONE) 5 MG immediate release tablet Take 1 tablet (5 mg total) every 6 (six) hours as needed by mouth for severe pain. Qty: 15 tablet, Refills: 0      CONTINUE these medications which have NOT CHANGED   Details  clonazePAM (KLONOPIN) 0.5 MG tablet Take 1 tablet (0.5 mg total) by mouth daily as needed for anxiety. Qty: 30 tablet, Refills: 0    cyclobenzaprine (FLEXERIL) 10 MG tablet Take 1 tablet (10 mg total) by mouth 3 (three) times daily as needed for muscle spasms. Qty: 60 tablet, Refills: 0   Associated Diagnoses: Chronic right shoulder pain    FLUoxetine HCl 60 MG TABS Take 60 mg by mouth daily. Qty: 30 tablet, Refills: 1    gabapentin (NEURONTIN) 600 MG tablet TAKE 1 TABLET BY MOUTH THREE TIMES DAILY Qty: 90 tablet, Refills: 2  lamoTRIgine (LAMICTAL) 100 MG tablet Take 1 tablet (100 mg total) by mouth 2 (two) times daily. Qty: 60 tablet, Refills: 11    levETIRAcetam (KEPPRA) 500 MG tablet Take 3 tablets (1,500 mg total) by mouth 2 (two) times daily. Qty: 180 tablet, Refills: 11    mirtazapine (REMERON) 45 MG tablet Take 1 tablet (45 mg total) by mouth at bedtime. Qty: 30 tablet, Refills: 1    pramipexole (MIRAPEX) 0.5 MG tablet TAKE ONE TABLET BY MOUTH TWICE DAILY Qty: 60 tablet,  Refills: 3    traZODone (DESYREL) 150 MG tablet Take 1 tablet (150 mg total) by mouth at bedtime. Qty: 30 tablet, Refills: 5   Associated Diagnoses: Primary insomnia    fluticasone (FLONASE) 50 MCG/ACT nasal spray Place 2 sprays into both nostrils daily. Qty: 16 g, Refills: 6   Associated Diagnoses: Dysfunction of both eustachian tubes; Acute non-recurrent frontal sinusitis    loratadine (CLARITIN) 10 MG tablet Take 1 tablet (10 mg total) by mouth daily. Qty: 30 tablet, Refills: 11   Associated Diagnoses: Dysfunction of both eustachian tubes      STOP taking these medications     cefdinir (OMNICEF) 300 MG capsule        Allergies  Allergen Reactions  . Acetaminophen Other (See Comments)    Has fatty deposits on liver  . Benadryl [Diphenhydramine Hcl] Other (See Comments)    hyperactivity and seizures  . Codeine Itching and Rash    seizures  . Dilantin [Phenytoin Sodium Extended] Other (See Comments)    Elevated LFT's  . Melatonin Other (See Comments)    seizures  . Ultram [Tramadol Hcl] Other (See Comments)    Seizures  . Vimpat [Lacosamide] Other (See Comments)    Severe dizziness  . Betadine [Povidone Iodine] Rash  . Latex Rash  . Penicillins Itching and Rash    Has patient had a PCN reaction causing immediate rash, facial/tongue/throat swelling, SOB or lightheadedness with hypotension: No Has patient had a PCN reaction causing severe rash involving mucus membranes or skin necrosis: No Has patient had a PCN reaction that required hospitalization No Has patient had a PCN reaction occurring within the last 10 years: No If all of the above answers are "NO", then may proceed with Cephalosporin use.  . Sulfa Antibiotics Rash  . Tape Rash    Paper tape please  . Vicodin [Hydrocodone-Acetaminophen] Itching      The results of significant diagnostics from this hospitalization (including imaging, microbiology, ancillary and laboratory) are listed below for reference.     Significant Diagnostic Studies: Dg Chest 2 View  Result Date: 11/14/2016 CLINICAL DATA:  Seizure today.  Disoriented. EXAM: CHEST  2 VIEW COMPARISON:  11/09/2016.  03/28/2016. FINDINGS: Poor inspiration. Mild cardiomegaly. Patchy bilateral pulmonary infiltrates right more than left consistent with pneumonia. This is slightly improved on the right and slightly worsened on the left compared to the previous study. No effusions. No acute bone finding. IMPRESSION: Patchy bilateral lower lobe pneumonia, slightly improved on the right and slightly worsened on the left compared to the study of 10/28. Poor inspiration today however. Electronically Signed   By: Nelson Chimes M.D.   On: 11/14/2016 15:36   Dg Chest 2 View  Result Date: 11/09/2016 CLINICAL DATA:  Pt reports fever, non-productive cough x 3 days Denies hx heart or lung conditions, nonsmoker Hx chronic bronchitis (per history notes) EXAM: CHEST  2 VIEW COMPARISON:  03/28/2016 FINDINGS: Heart size is normal. There is dense airspace filling opacity within  the right lower lobe, compatible with infectious infiltrate. The left lung is clear. No pulmonary edema. IMPRESSION: Right lower lobe infiltrate. Followup PA and lateral chest X-ray is recommended in 3-4 weeks following trial of antibiotic therapy to ensure resolution and exclude underlying malignancy. Electronically Signed   By: Nolon Nations M.D.   On: 11/09/2016 09:26   Ct Head Wo Contrast  Result Date: 11/14/2016 CLINICAL DATA:  2 seizure episodes this morning. Altered level of consciousness since. EXAM: CT HEAD WITHOUT CONTRAST TECHNIQUE: Contiguous axial images were obtained from the base of the skull through the vertex without intravenous contrast. COMPARISON:  CT head without contrast 01/23/2015 FINDINGS: Brain: No acute infarct, hemorrhage, or mass lesion is present. The ventricles are of normal size. No focal lesion is present to explain seizures. No significant extraaxial fluid  collection is present. Vascular: Faint atherosclerotic calcifications are present within the cavernous internal carotid arteries. There is no hyperdense vessel. Skull: The calvarium is intact. No focal lytic or blastic lesions are present. Sinuses/Orbits: The paranasal sinuses and mastoid air cells are clear. The visualized orbits are within normal limits. IMPRESSION: Negative CT of the head. Electronically Signed   By: San Morelle M.D.   On: 11/14/2016 19:43    Microbiology: Recent Results (from the past 240 hour(s))  Blood Culture (routine x 2)     Status: None   Collection Time: 11/09/16  9:35 AM  Result Value Ref Range Status   Specimen Description BLOOD LEFT FOREARM  Final   Special Requests   Final    BOTTLES DRAWN AEROBIC AND ANAEROBIC Blood Culture adequate volume   Culture   Final    NO GROWTH 5 DAYS Performed at [facility], [address] [place], [place] [address]    Report Status 11/14/2016 FINAL  Final  Blood Culture (routine x 2)     Status: None   Collection Time: 11/09/16  9:40 AM  Result Value Ref Range Status   Specimen Description BLOOD RIGHT FOREARM  Final   Special Requests   Final    BOTTLES DRAWN AEROBIC AND ANAEROBIC Blood Culture adequate volume   Culture   Final    NO GROWTH 5 DAYS Performed at [facility], [place] [address], [place], [place] [address]    Report Status 11/14/2016 FINAL  Final  Urine culture     Status: Abnormal   Collection Time: 11/09/16  9:52 AM  Result Value Ref Range Status   Specimen Description URINE, RANDOM  Final   Special Requests NONE  Final   Culture MULTIPLE SPECIES PRESENT, SUGGEST RECOLLECTION (A)  Final   Report Status 11/10/2016 FINAL  Final  Blood culture (routine x 2)     Status: None (Preliminary result)   Collection Time: 11/14/16  4:02 PM  Result Value Ref Range Status   Specimen Description BLOOD LEFT ANTECUBITAL  Final   Special Requests   Final    BOTTLES DRAWN AEROBIC AND ANAEROBIC Blood  Culture adequate volume   Culture   Final    NO GROWTH 2 DAYS Performed at [facility], [place] [address], [place], [place] [address]    Report Status PENDING  Incomplete  Blood culture (routine x 2)     Status: None (Preliminary result)   Collection Time: 11/14/16  4:05 PM  Result Value Ref Range Status   Specimen Description BLOOD RIGHT HAND  Final   Special Requests   Final    BOTTLES DRAWN AEROBIC AND ANAEROBIC  Blood Culture adequate volume   Culture   Final    NO GROWTH 2 DAYS Performed at [facility], [place] [address] [place], [place] [address]    Report Status PENDING  Incomplete  MRSA PCR Screening     Status: None   Collection Time: 11/15/16 12:25 AM  Result Value Ref Range Status   MRSA by PCR NEGATIVE NEGATIVE Final    Comment:        The GeneXpert MRSA Assay (FDA approved for NASAL specimens only), is one component of a comprehensive MRSA colonization surveillance program. It is not intended to diagnose MRSA infection nor to guide or monitor treatment for MRSA infections.      Labs: Basic Metabolic Panel: Recent Labs  Lab 11/11/16 0527 11/14/16 1445 11/14/16 1446 11/15/16 0645  NA 144  --  143 144  K 3.9  --  3.9 3.7  CL 110  --  106 110  CO2 25  --  21* 25  GLUCOSE 95  --  90 85  BUN 6  --  12 12  CREATININE 0.63  --  0.57 0.67  CALCIUM 8.7*  --  9.5 8.5*  MG  --  2.5*  --   --    Liver Function Tests: Recent Labs  Lab 11/14/16 1446 11/15/16 0645  AST 55* 68*  ALT 40 48  ALKPHOS 78 57  BILITOT 0.8 0.7  PROT 8.4* 6.7  ALBUMIN 4.4 3.5   No results for input(s): LIPASE, AMYLASE in the last 168 hours. No results for input(s): AMMONIA in the last 168 hours. CBC: Recent Labs  Lab 11/11/16 0527 11/12/16 0624 11/14/16 1446 11/15/16 0645  WBC 11.1* 8.2 9.4 6.0  NEUTROABS  --   --  5.2  --   HGB 10.1* 10.5* 12.6 10.9*  HCT 30.5* 31.4* 37.2 32.8*  MCV 88.2 87.2 87.7 88.2  PLT 225 239 394 328       Signed:  Remiel Corti S  MD.  [facility] 11/17/2016, 9:52 AM

## 2016-11-17 NOTE — Progress Notes (Signed)
Reviewed discharge information with patient and caregiver. Answered all questions. Patient and caregiver able to teach back medications and reasons to contact MD or 911. Patient verbalizes importance of PCP follow up appointment. 

## 2016-11-19 LAB — CULTURE, BLOOD (ROUTINE X 2)
Culture: NO GROWTH
Culture: NO GROWTH
Special Requests: ADEQUATE
Special Requests: ADEQUATE

## 2016-11-27 ENCOUNTER — Other Ambulatory Visit: Payer: Self-pay | Admitting: Physician Assistant

## 2016-11-27 DIAGNOSIS — F5101 Primary insomnia: Secondary | ICD-10-CM

## 2016-12-01 ENCOUNTER — Ambulatory Visit (INDEPENDENT_AMBULATORY_CARE_PROVIDER_SITE_OTHER): Payer: PPO | Admitting: Family Medicine

## 2016-12-01 ENCOUNTER — Encounter: Payer: Self-pay | Admitting: Family Medicine

## 2016-12-01 ENCOUNTER — Ambulatory Visit (INDEPENDENT_AMBULATORY_CARE_PROVIDER_SITE_OTHER): Payer: PPO

## 2016-12-01 VITALS — BP 101/66 | HR 74 | Temp 97.8°F | Ht 60.0 in | Wt 160.0 lb

## 2016-12-01 DIAGNOSIS — J189 Pneumonia, unspecified organism: Secondary | ICD-10-CM

## 2016-12-01 DIAGNOSIS — F321 Major depressive disorder, single episode, moderate: Secondary | ICD-10-CM | POA: Diagnosis not present

## 2016-12-01 NOTE — Progress Notes (Signed)
Subjective:  Patient ID: [name], female    DOB: 09/16/1965  Age: 51 y.o. MRN: [id number]  CC: Hospitalization Follow-up (pt here today following up after being admitted to the hospital for pneumonia)   HPI [name] presents for recheck of pneumonia.  She is finishing up her antibiotic.  She is no longer short of breath.  She is got a little bit of residual cough.  No fever chills or sweats.  No productivity.  Depression screen [facility] 2/9 12/01/2016 11/07/2016 09/05/2016  Decreased Interest 0 0 0  Down, Depressed, Hopeless 0 0 0  PHQ - 2 Score 0 0 0  Altered sleeping - - -  Tired, decreased energy - - -  Change in appetite - - -  Feeling bad or failure about yourself  - - -  Trouble concentrating - - -  Moving slowly or fidgety/restless - - -  Suicidal thoughts - - -  PHQ-9 Score - - -  Difficult doing work/chores - - -  Some recent data might be hidden    History [name] has a past medical history of Arthritis, Bronchitis, Chronic bronchitis ([place]), Chronic kidney disease, Colon polyps, Constipation, Depression, Eczema, Epilepsy ([place]), Fatty liver, Headache(784.0), High cholesterol, History of kidney stones, Osteoarthritis, Other convulsions (05/21/12), Rectal bleed, Restless leg, Seizures ([place]), and Vertigo.   She has a past surgical history that includes Total hip arthroplasty (Left, 1993; 1995; 2000); Abdominal hysterectomy (2001); Colonoscopy; Joint replacement; Mass excision (10/22/2011); Bladder suspension; Cesarean section (2951; 1988); Bunionectomy (Left, 2000); Total knee arthroplasty (Left, 05/07/2015); and Total knee arthroplasty (Right, 10/01/2015).   Her family history includes Bipolar disorder in her son; Cancer in her brother, maternal aunt, and mother; Depression in her father; Diabetes in her brother and maternal aunt; Drug abuse in her son.She reports that  has never smoked. she has never used smokeless tobacco. She reports that she does not drink alcohol or use  drugs.    ROS Review of Systems  Constitutional: Negative for activity change, appetite change and fever.  HENT: Negative for congestion, rhinorrhea and sore throat.   Eyes: Negative for visual disturbance.  Respiratory: Negative for cough and shortness of breath.   Cardiovascular: Negative for chest pain and palpitations.  Gastrointestinal: Negative for abdominal pain, diarrhea and nausea.  Genitourinary: Negative for dysuria.  Musculoskeletal: Negative for arthralgias and myalgias.    Objective:  BP 101/66   Pulse 74   Temp 97.8 F (36.6 C) (Oral)   Ht 5' (1.524 m)   Wt 160 lb (72.6 kg)   SpO2 97%   BMI 31.25 kg/m   BP Readings from Last 3 Encounters:  12/01/16 101/66  11/17/16 122/78  11/12/16 (!) 137/97    Wt Readings from Last 3 Encounters:  12/01/16 160 lb (72.6 kg)  11/16/16 163 lb 9.3 oz (74.2 kg)  11/12/16 165 lb 12.6 oz (75.2 kg)     Physical Exam  Constitutional: She is oriented to person, place, and time. She appears well-developed and well-nourished. No distress.  HENT:  Head: Normocephalic and atraumatic.  Eyes: Conjunctivae are normal. Pupils are equal, round, and reactive to light.  Neck: Normal range of motion. Neck supple. No thyromegaly present.  Cardiovascular: Normal rate, regular rhythm and normal heart sounds.  No murmur heard. Pulmonary/Chest: Effort normal and breath sounds normal. No respiratory distress. She has no wheezes. She has no rales.  Abdominal: Soft. Bowel sounds are normal. She exhibits no distension. There is no tenderness.  Musculoskeletal: Normal  range of motion.  Lymphadenopathy:    She has no cervical adenopathy.  Neurological: She is alert and oriented to person, place, and time.  Skin: Skin is warm and dry.  Psychiatric: She has a normal mood and affect. Her behavior is normal. Judgment and thought content normal.    CXR has cleared  Assessment & Plan:   [name] was seen today for hospitalization  follow-up.  Diagnoses and all orders for this visit:  Multifocal pneumonia -     DG Chest 2 View; Future -     CBC with Differential/Platelet -     CMP14+EGFR  Pneumonia due to infectious organism, unspecified laterality, unspecified part of lung       I have discontinued Josaphine A. Seigler's oxyCODONE. I am also having her maintain her levETIRAcetam, lamoTRIgine, pramipexole, cyclobenzaprine, FLUoxetine HCl, clonazePAM, mirtazapine, fluticasone, loratadine, gabapentin, and traZODone.  Allergies as of 12/01/2016      Reactions   Acetaminophen Other (See Comments)   Has fatty deposits on liver   Benadryl [diphenhydramine Hcl] Other (See Comments)   hyperactivity and seizures   Codeine Itching, Rash   seizures   Dilantin [phenytoin Sodium Extended] Other (See Comments)   Elevated LFT's   Melatonin Other (See Comments)   seizures   Ultram [tramadol Hcl] Other (See Comments)   Seizures   Vimpat [lacosamide] Other (See Comments)   Severe dizziness   Betadine [povidone Iodine] Rash   Latex Rash   Penicillins Itching, Rash   Has patient had a PCN reaction causing immediate rash, facial/tongue/throat swelling, SOB or lightheadedness with hypotension: No Has patient had a PCN reaction causing severe rash involving mucus membranes or skin necrosis: No Has patient had a PCN reaction that required hospitalization No Has patient had a PCN reaction occurring within the last 10 years: No If all of the above answers are "NO", then may proceed with Cephalosporin use.   Sulfa Antibiotics Rash   Tape Rash   Paper tape please   Vicodin [hydrocodone-acetaminophen] Itching      Medication List        Accurate as of 12/01/16 10:00 PM. Always use your most recent med list.          clonazePAM 0.5 MG tablet Commonly known as:  KLONOPIN Take 1 tablet (0.5 mg total) by mouth daily as needed for anxiety.   cyclobenzaprine 10 MG tablet Commonly known as:  FLEXERIL Take 1 tablet (10 mg  total) by mouth 3 (three) times daily as needed for muscle spasms.   FLUoxetine HCl 60 MG Tabs Take 60 mg by mouth daily.   fluticasone 50 MCG/ACT nasal spray Commonly known as:  FLONASE Place 2 sprays into both nostrils daily.   gabapentin 600 MG tablet Commonly known as:  NEURONTIN TAKE 1 TABLET BY MOUTH THREE TIMES DAILY   lamoTRIgine 100 MG tablet Commonly known as:  LAMICTAL Take 1 tablet (100 mg total) by mouth 2 (two) times daily.   levETIRAcetam 500 MG tablet Commonly known as:  KEPPRA Take 3 tablets (1,500 mg total) by mouth 2 (two) times daily.   loratadine 10 MG tablet Commonly known as:  CLARITIN Take 1 tablet (10 mg total) by mouth daily.   mirtazapine 45 MG tablet Commonly known as:  REMERON Take 1 tablet (45 mg total) by mouth at bedtime.   pramipexole 0.5 MG tablet Commonly known as:  MIRAPEX TAKE ONE TABLET BY MOUTH TWICE DAILY   traZODone 150 MG tablet Commonly known as:  DESYREL TAKE  1 TABLET BY MOUTH AT BEDTIME        Follow-up: No Follow-up on file.  [name], M.D.

## 2016-12-02 ENCOUNTER — Other Ambulatory Visit (INDEPENDENT_AMBULATORY_CARE_PROVIDER_SITE_OTHER): Payer: PPO

## 2016-12-02 DIAGNOSIS — J189 Pneumonia, unspecified organism: Secondary | ICD-10-CM | POA: Diagnosis not present

## 2016-12-02 LAB — CMP14+EGFR
ALT: 46 IU/L — ABNORMAL HIGH (ref 0–32)
AST: 61 IU/L — ABNORMAL HIGH (ref 0–40)
Albumin/Globulin Ratio: 1.8 (ref 1.2–2.2)
Albumin: 4.3 g/dL (ref 3.5–5.5)
Alkaline Phosphatase: 71 IU/L (ref 39–117)
BUN/Creatinine Ratio: 9 (ref 9–23)
BUN: 7 mg/dL (ref 6–24)
Bilirubin Total: 0.3 mg/dL (ref 0.0–1.2)
CO2: 29 mmol/L (ref 20–29)
Calcium: 9.6 mg/dL (ref 8.7–10.2)
Chloride: 102 mmol/L (ref 96–106)
Creatinine, Ser: 0.75 mg/dL (ref 0.57–1.00)
GFR calc Af Amer: 107 mL/min/{1.73_m2} (ref >59)
GFR calc non Af Amer: 93 mL/min/{1.73_m2} (ref >59)
Globulin, Total: 2.4 g/dL (ref 1.5–4.5)
Glucose: 80 mg/dL (ref 65–99)
Potassium: 4.6 mmol/L (ref 3.5–5.2)
Sodium: 143 mmol/L (ref 134–144)
Total Protein: 6.7 g/dL (ref 6.0–8.5)

## 2016-12-02 LAB — CBC WITH DIFFERENTIAL/PLATELET
Basophils Absolute: 0 10*3/uL (ref 0.0–0.2)
Basos: 0 %
EOS (ABSOLUTE): 0 10*3/uL (ref 0.0–0.4)
Eos: 0 %
Hematocrit: 36.1 % (ref 34.0–46.6)
Hemoglobin: 11.7 g/dL (ref 11.1–15.9)
Immature Grans (Abs): 0 10*3/uL (ref 0.0–0.1)
Immature Granulocytes: 0 %
Lymphocytes Absolute: 2.8 10*3/uL (ref 0.7–3.1)
Lymphs: 46 %
MCH: 28.7 pg (ref 26.6–33.0)
MCHC: 32.4 g/dL (ref 31.5–35.7)
MCV: 89 fL (ref 79–97)
Monocytes Absolute: 0.6 10*3/uL (ref 0.1–0.9)
Monocytes: 10 %
Neutrophils Absolute: 2.7 10*3/uL (ref 1.4–7.0)
Neutrophils: 44 %
Platelets: 296 10*3/uL (ref 150–379)
RBC: 4.08 x10E6/uL (ref 3.77–5.28)
RDW: 14.7 % (ref 12.3–15.4)
WBC: 6.2 10*3/uL (ref 3.4–10.8)

## 2016-12-02 NOTE — Progress Notes (Signed)
Your chest x-ray looked normal. Thanks, WS.

## 2016-12-10 NOTE — Progress Notes (Deleted)
[place] MD/PA/NP OP Progress Note  12/10/2016 1:07 PM RIONA LAHTI  MRN:  [id number]  Chief Complaint:  HPI:  - Patient was admitted twice since the last appointment; community acquired pneumonia, followed by seizure.   Visit Diagnosis: No diagnosis found.  Past Psychiatric History:  I have reviewed the patient's psychiatry history in detail and updated the patient record. Outpatient: [facility],  Psychiatry admission: in 2004 for suicide attempt (per note, she was also admitted in 09/04/2010-09/06/2010)  Previous suicide attempt: overdosing flexeril in 2004,  Past trials of medication: fluoxetine, citalopram, Trazodone, lorazepam, clonazepam History of violence: denies    Past Medical History:  Past Medical History:  Diagnosis Date  . Arthritis    "knees" (04/22/2012)  . Bronchitis   . Chronic bronchitis ([place])    "yearly; when the weather changes" (04/22/2012)  . Chronic kidney disease    kidney stones  . Colon polyps    adenomatous and hyperplastic-  . Constipation   . Depression   . Eczema   . Epilepsy ([place])    "been having them right often here lately" (04/22/2012)  . Fatty liver   . YTKPTWSF(681.2)    "1/wk" (04/22/2012)  . High cholesterol   . History of kidney stones   . Osteoarthritis    [name] 04/22/2012  . Other convulsions 05/21/12   non-epileptic spells  . Rectal bleed    in toilet- bright red  . Restless leg   . Seizures ([place])   . Vertigo     Past Surgical History:  Procedure Laterality Date  . ABDOMINAL HYSTERECTOMY  2001  . BLADDER SUSPENSION    . BUNIONECTOMY Left 2000  . CESAREAN SECTION  1987; 1988  . COLONOSCOPY    . JOINT REPLACEMENT    . MASS EXCISION  10/22/2011   Procedure: EXCISION MASS;  Surgeon: [name], MD;  Location: [facility];  Service: General;  Laterality: Right;  excision right buttock mass  . TOTAL HIP ARTHROPLASTY Left 1993; 1995; 2000  . TOTAL KNEE ARTHROPLASTY Left 05/07/2015   Procedure: TOTAL LEFT KNEE ARTHROPLASTY;   Surgeon: [name], MD;  Location: [facility];  Service: Orthopedics;  Laterality: Left;  . TOTAL KNEE ARTHROPLASTY Right 10/01/2015   Procedure: RIGHT TOTAL KNEE ARTHROPLASTY;  Surgeon: [name], MD;  Location: [facility];  Service: Orthopedics;  Laterality: Right;    Family Psychiatric History: I have reviewed the patient's family history in detail and updated the patient record.  Family History:  Family History  Problem Relation Age of Onset  . Cancer Mother   . Depression Father   . Cancer Brother   . Diabetes Brother   . Cancer Maternal Aunt   . Diabetes Maternal Aunt   . Bipolar disorder Son   . Drug abuse Son     Social History:  Social History   Socioeconomic History  . Marital status: Married    Spouse name: [name]  . Number of children: 2  . Years of education: 12 +  . Highest education level: Not on file  Social Needs  . Financial resource strain: Not on file  . Food insecurity - worry: Not on file  . Food insecurity - inability: Not on file  . Transportation needs - medical: Not on file  . Transportation needs - non-medical: Not on file  Occupational History  . Occupation: unemployed    Employer: OTHER  Tobacco Use  . Smoking status: Never Smoker  . Smokeless tobacco: Never Used  Substance and Sexual Activity  .  Alcohol use: No    Alcohol/week: 0.0 oz    Comment: 12-25-2015 per pt no  . Drug use: No    Comment: 21-12-2015 per pt no   . Sexual activity: Not Currently    Birth control/protection: Surgical  Other Topics Concern  . Not on file  Social History Narrative   Patient is married [name]) and lives at home with her husband.   Patient has two adult children.   Patient is disabled.   Patient has a high school education and some trade school.   Patient is right-handed.   Patient drinks four glasses of soda and tea daily.    Allergies:  Allergies  Allergen Reactions  . Acetaminophen Other (See Comments)    Has fatty deposits on liver  .  Benadryl [Diphenhydramine Hcl] Other (See Comments)    hyperactivity and seizures  . Codeine Itching and Rash    seizures  . Dilantin [Phenytoin Sodium Extended] Other (See Comments)    Elevated LFT's  . Melatonin Other (See Comments)    seizures  . Ultram [Tramadol Hcl] Other (See Comments)    Seizures  . Vimpat [Lacosamide] Other (See Comments)    Severe dizziness  . Betadine [Povidone Iodine] Rash  . Latex Rash  . Penicillins Itching and Rash    Has patient had a PCN reaction causing immediate rash, facial/tongue/throat swelling, SOB or lightheadedness with hypotension: No Has patient had a PCN reaction causing severe rash involving mucus membranes or skin necrosis: No Has patient had a PCN reaction that required hospitalization No Has patient had a PCN reaction occurring within the last 10 years: No If all of the above answers are "NO", then may proceed with Cephalosporin use.  . Sulfa Antibiotics Rash  . Tape Rash    Paper tape please  . Vicodin [Hydrocodone-Acetaminophen] Itching    Metabolic Disorder Labs: Lab Results  Component Value Date   HGBA1C 5.4 11/28/2015   MPG 108 11/28/2015   No results found for: PROLACTIN Lab Results  Component Value Date   CHOL 164 07/07/2016   TRIG 124 07/07/2016   HDL 53 07/07/2016   CHOLHDL 3.1 07/07/2016   LDLCALC 86 07/07/2016   LDLCALC 136 (H) 04/09/2015   Lab Results  Component Value Date   TSH 1.900 07/07/2016   TSH 2.559 12/03/2015    Therapeutic Level Labs: No results found for: LITHIUM No results found for: VALPROATE No components found for:  CBMZ  Current Medications: Current Outpatient Medications  Medication Sig Dispense Refill  . clonazePAM (KLONOPIN) 0.5 MG tablet Take 1 tablet (0.5 mg total) by mouth daily as needed for anxiety. 30 tablet 0  . cyclobenzaprine (FLEXERIL) 10 MG tablet Take 1 tablet (10 mg total) by mouth 3 (three) times daily as needed for muscle spasms. (Patient not taking: Reported on  12/01/2016) 60 tablet 0  . FLUoxetine HCl 60 MG TABS Take 60 mg by mouth daily. 30 tablet 1  . fluticasone (FLONASE) 50 MCG/ACT nasal spray Place 2 sprays into both nostrils daily. (Patient taking differently: Place 2 sprays into both nostrils daily as needed for allergies. ) 16 g 6  . gabapentin (NEURONTIN) 600 MG tablet TAKE 1 TABLET BY MOUTH THREE TIMES DAILY 90 tablet 2  . lamoTRIgine (LAMICTAL) 100 MG tablet Take 1 tablet (100 mg total) by mouth 2 (two) times daily. 60 tablet 11  . levETIRAcetam (KEPPRA) 500 MG tablet Take 3 tablets (1,500 mg total) by mouth 2 (two) times daily. 180 tablet 11  .  loratadine (CLARITIN) 10 MG tablet Take 1 tablet (10 mg total) by mouth daily. 30 tablet 11  . mirtazapine (REMERON) 45 MG tablet Take 1 tablet (45 mg total) by mouth at bedtime. 30 tablet 1  . pramipexole (MIRAPEX) 0.5 MG tablet TAKE ONE TABLET BY MOUTH TWICE DAILY 60 tablet 3  . traZODone (DESYREL) 150 MG tablet TAKE 1 TABLET BY MOUTH AT BEDTIME 30 tablet 5   No current facility-administered medications for this visit.      Musculoskeletal: Strength & Muscle Tone: within normal limits Gait & Station: normal Patient leans: N/A  Psychiatric Specialty Exam: ROS  There were no vitals taken for this visit.There is no height or weight on file to calculate BMI.  General Appearance: Fairly Groomed  Eye Contact:  Good  Speech:  Clear and Coherent  Volume:  Normal  Mood:  {BHH MOOD:22306}  Affect:  {Affect (PAA):22687}  Thought Process:  Coherent and Goal Directed  Orientation:  Full (Time, Place, and Person)  Thought Content: Logical   Suicidal Thoughts:  {ST/HT (PAA):22692}  Homicidal Thoughts:  {ST/HT (PAA):22692}  Memory:  Immediate;   Good Recent;   Good Remote;   Good  Judgement:  {Judgement (PAA):22694}  Insight:  {Insight (PAA):22695}  Psychomotor Activity:  Normal  Concentration:  Concentration: Good and Attention Span: Good  Recall:  Good  Fund of Knowledge: Good  Language:  Good  Akathisia:  No  Handed:  Right  AIMS (if indicated): not done  Assets:  Communication Skills Desire for Improvement  ADL's:  Intact  Cognition: WNL  Sleep:  {BHH GOOD/FAIR/POOR:22877}   Screenings: GAD-7     Office Visit from 01/31/2015 in [facility]  Total GAD-7 Score  15    Mini-Mental     Office Visit from 02/20/2014 in [facility]  Total Score (max 30 points )  30    PHQ2-9     Office Visit from 12/01/2016 in [facility] Visit from 11/07/2016 in [facility] Visit from 09/05/2016 in [facility] Visit from 07/07/2016 in [facility] Visit from 05/19/2016 in [facility]  PHQ-2 Total Score  0  0  0  0  0       Assessment and Plan:  ZURISADAI HELMINIAK is a 51 y.o. year old female with a history of depression,seudoseizure, partial symptomatic epilepsy with complex partial seizures, osteoarthritis, congenital hip dislocation s/p replacement , who presents for follow up appointment for No diagnosis found.  # MDD, moderate, recurrent without psychotic features  Although there has been overall improvement in neurovegetative symptoms, she continues to have anxiety which interferes her life. Will uptitrate mirtazapine to target residual mood symptoms. Will continue fluoxetine to target depression. Will continue clonazepam prn for anxiety and trazodone for sleep. Discussed behavioral activation.   Plan 1. Continue fluoxetine 60 mg daily  2. Increase mirtazapine 45 mg at night 3. Continue clonazepam 0.5 mg daily as needed for anxiety (she has one refill left- ordered another for a month to last until the next appointment) 4. Continue Trazodone 150 mg at night for sleep 5. Return to clinic in two months  Patient is on gabapentin, Lamictal, Keppra for seizure. She is also on pramipexole for restless  leg  The patient demonstrates the following risk factors for suicide: Chronic risk factors for suicide include: psychiatric disorder of depressionand previous suicide attempts of overdosing medication. Acute risk factorsfor suicide include: unemployment  and her husband with terminal illness. Protective factorsfor this patient include: positive social support, coping skills and hope for the future. Considering these factors, the overall suicide risk at this point appears to be low. Patient isappropriate for outpatient follow up.     Norman Clay, MD 12/10/2016, 1:07 PM

## 2016-12-16 ENCOUNTER — Ambulatory Visit (HOSPITAL_COMMUNITY): Payer: Self-pay | Admitting: Psychiatry

## 2016-12-17 DIAGNOSIS — F445 Conversion disorder with seizures or convulsions: Secondary | ICD-10-CM | POA: Diagnosis not present

## 2016-12-17 DIAGNOSIS — M25562 Pain in left knee: Secondary | ICD-10-CM | POA: Diagnosis not present

## 2016-12-17 DIAGNOSIS — Z23 Encounter for immunization: Secondary | ICD-10-CM | POA: Diagnosis not present

## 2016-12-17 DIAGNOSIS — G40409 Other generalized epilepsy and epileptic syndromes, not intractable, without status epilepticus: Secondary | ICD-10-CM | POA: Diagnosis not present

## 2016-12-17 DIAGNOSIS — R400 Somnolence: Secondary | ICD-10-CM | POA: Diagnosis not present

## 2016-12-17 DIAGNOSIS — F329 Major depressive disorder, single episode, unspecified: Secondary | ICD-10-CM | POA: Diagnosis not present

## 2016-12-17 DIAGNOSIS — R569 Unspecified convulsions: Secondary | ICD-10-CM | POA: Diagnosis not present

## 2016-12-17 DIAGNOSIS — G2581 Restless legs syndrome: Secondary | ICD-10-CM | POA: Diagnosis not present

## 2016-12-17 DIAGNOSIS — G40309 Generalized idiopathic epilepsy and epileptic syndromes, not intractable, without status epilepticus: Secondary | ICD-10-CM | POA: Diagnosis not present

## 2016-12-17 DIAGNOSIS — M25561 Pain in right knee: Secondary | ICD-10-CM | POA: Diagnosis not present

## 2016-12-17 DIAGNOSIS — R4182 Altered mental status, unspecified: Secondary | ICD-10-CM | POA: Diagnosis not present

## 2016-12-18 DIAGNOSIS — R4182 Altered mental status, unspecified: Secondary | ICD-10-CM | POA: Diagnosis not present

## 2016-12-18 DIAGNOSIS — R400 Somnolence: Secondary | ICD-10-CM | POA: Diagnosis not present

## 2016-12-18 DIAGNOSIS — R569 Unspecified convulsions: Secondary | ICD-10-CM | POA: Diagnosis not present

## 2016-12-18 DIAGNOSIS — G40309 Generalized idiopathic epilepsy and epileptic syndromes, not intractable, without status epilepticus: Secondary | ICD-10-CM | POA: Diagnosis not present

## 2016-12-18 DIAGNOSIS — F445 Conversion disorder with seizures or convulsions: Secondary | ICD-10-CM | POA: Diagnosis not present

## 2016-12-24 NOTE — Progress Notes (Deleted)
[facility] MD/PA/NP OP Progress Note  12/24/2016 11:25 AM [name]  MRN:  [id number]  Chief Complaint:  HPI:  - Per chart review, patient was admitted twice since the last appointment; pneumonia followed by seizure.  Visit Diagnosis: No diagnosis found.  Past Psychiatric History:  I have reviewed the patient's psychiatry history in detail and updated the patient record. Outpatient: [facility],  Psychiatry admission: in 2004 for suicide attempt (per note, she was also admitted in 09/04/2010-09/06/2010)  Previous suicide attempt: overdosing flexeril in 2004,  Past trials of medication: fluoxetine, citalopram, Trazodone, lorazepam, clonazepam History of violence: denies    Past Medical History:  Past Medical History:  Diagnosis Date  . Arthritis    "knees" (04/22/2012)  . Bronchitis   . Chronic bronchitis ([place])    "yearly; when the weather changes" (04/22/2012)  . Chronic kidney disease    kidney stones  . Colon polyps    adenomatous and hyperplastic-  . Constipation   . Depression   . Eczema   . Epilepsy ([place])    "been having them right often here lately" (04/22/2012)  . Fatty liver   . XFGHWEXH(371.6)    "1/wk" (04/22/2012)  . High cholesterol   . History of kidney stones   . Osteoarthritis    [name] 04/22/2012  . Other convulsions 05/21/12   non-epileptic spells  . Rectal bleed    in toilet- bright red  . Restless leg   . Seizures ([place])   . Vertigo     Past Surgical History:  Procedure Laterality Date  . ABDOMINAL HYSTERECTOMY  2001  . BLADDER SUSPENSION    . BUNIONECTOMY Left 2000  . CESAREAN SECTION  1987; 1988  . COLONOSCOPY    . JOINT REPLACEMENT    . MASS EXCISION  10/22/2011   Procedure: EXCISION MASS;  Surgeon: [name], MD;  Location: [facility];  Service: General;  Laterality: Right;  excision right buttock mass  . TOTAL HIP ARTHROPLASTY Left 1993; 1995; 2000  . TOTAL KNEE ARTHROPLASTY Left 05/07/2015   Procedure: TOTAL LEFT KNEE ARTHROPLASTY;   Surgeon: [name], MD;  [facility];  Service: Orthopedics;  Laterality: Left;  . TOTAL KNEE ARTHROPLASTY Right 10/01/2015   Procedure: RIGHT TOTAL KNEE ARTHROPLASTY;  Surgeon: [name], MD;  [facility];  Service: Orthopedics;  Laterality: Right;    Family Psychiatric History: I have reviewed the patient's family history in detail and updated the patient record.  Family History:  Family History  Problem Relation Age of Onset  . Cancer Mother   . Depression Father   . Cancer Brother   . Diabetes Brother   . Cancer Maternal Aunt   . Diabetes Maternal Aunt   . Bipolar disorder Son   . Drug abuse Son     Social History:  Social History   Socioeconomic History  . Marital status: Married    Spouse name: [name]  . Number of children: 2  . Years of education: 12 +  . Highest education level: Not on file  Social Needs  . Financial resource strain: Not on file  . Food insecurity - worry: Not on file  . Food insecurity - inability: Not on file  . Transportation needs - medical: Not on file  . Transportation needs - non-medical: Not on file  Occupational History  . Occupation: unemployed    Employer: OTHER  Tobacco Use  . Smoking status: Never Smoker  . Smokeless tobacco: Never Used  Substance and Sexual Activity  .  Alcohol use: No    Alcohol/week: 0.0 oz    Comment: 12-25-2015 per pt no  . Drug use: No    Comment: 21-12-2015 per pt no   . Sexual activity: Not Currently    Birth control/protection: Surgical  Other Topics Concern  . Not on file  Social History Narrative   Patient is married [name]) and lives at home with her husband.   Patient has two adult children.   Patient is disabled.   Patient has a high school education and some trade school.   Patient is right-handed.   Patient drinks four glasses of soda and tea daily.    Allergies:  Allergies  Allergen Reactions  . Acetaminophen Other (See Comments)    Has fatty deposits on liver  .  Benadryl [Diphenhydramine Hcl] Other (See Comments)    hyperactivity and seizures  . Codeine Itching and Rash    seizures  . Dilantin [Phenytoin Sodium Extended] Other (See Comments)    Elevated LFT's  . Melatonin Other (See Comments)    seizures  . Ultram [Tramadol Hcl] Other (See Comments)    Seizures  . Vimpat [Lacosamide] Other (See Comments)    Severe dizziness  . Betadine [Povidone Iodine] Rash  . Latex Rash  . Penicillins Itching and Rash    Has patient had a PCN reaction causing immediate rash, facial/tongue/throat swelling, SOB or lightheadedness with hypotension: No Has patient had a PCN reaction causing severe rash involving mucus membranes or skin necrosis: No Has patient had a PCN reaction that required hospitalization No Has patient had a PCN reaction occurring within the last 10 years: No If all of the above answers are "NO", then may proceed with Cephalosporin use.  . Sulfa Antibiotics Rash  . Tape Rash    Paper tape please  . Vicodin [Hydrocodone-Acetaminophen] Itching    Metabolic Disorder Labs: Lab Results  Component Value Date   HGBA1C 5.4 11/28/2015   MPG 108 11/28/2015   No results found for: PROLACTIN Lab Results  Component Value Date   CHOL 164 07/07/2016   TRIG 124 07/07/2016   HDL 53 07/07/2016   CHOLHDL 3.1 07/07/2016   LDLCALC 86 07/07/2016   LDLCALC 136 (H) 04/09/2015   Lab Results  Component Value Date   TSH 1.900 07/07/2016   TSH 2.559 12/03/2015    Therapeutic Level Labs: No results found for: LITHIUM No results found for: VALPROATE No components found for:  CBMZ  Current Medications: Current Outpatient Medications  Medication Sig Dispense Refill  . clonazePAM (KLONOPIN) 0.5 MG tablet Take 1 tablet (0.5 mg total) by mouth daily as needed for anxiety. 30 tablet 0  . cyclobenzaprine (FLEXERIL) 10 MG tablet Take 1 tablet (10 mg total) by mouth 3 (three) times daily as needed for muscle spasms. (Patient not taking: Reported on  12/01/2016) 60 tablet 0  . FLUoxetine HCl 60 MG TABS Take 60 mg by mouth daily. 30 tablet 1  . fluticasone (FLONASE) 50 MCG/ACT nasal spray Place 2 sprays into both nostrils daily. (Patient taking differently: Place 2 sprays into both nostrils daily as needed for allergies. ) 16 g 6  . gabapentin (NEURONTIN) 600 MG tablet TAKE 1 TABLET BY MOUTH THREE TIMES DAILY 90 tablet 2  . lamoTRIgine (LAMICTAL) 100 MG tablet Take 1 tablet (100 mg total) by mouth 2 (two) times daily. 60 tablet 11  . levETIRAcetam (KEPPRA) 500 MG tablet Take 3 tablets (1,500 mg total) by mouth 2 (two) times daily. 180 tablet 11  .  loratadine (CLARITIN) 10 MG tablet Take 1 tablet (10 mg total) by mouth daily. 30 tablet 11  . mirtazapine (REMERON) 45 MG tablet Take 1 tablet (45 mg total) by mouth at bedtime. 30 tablet 1  . pramipexole (MIRAPEX) 0.5 MG tablet TAKE ONE TABLET BY MOUTH TWICE DAILY 60 tablet 3  . traZODone (DESYREL) 150 MG tablet TAKE 1 TABLET BY MOUTH AT BEDTIME 30 tablet 5   No current facility-administered medications for this visit.      Musculoskeletal: Strength & Muscle Tone: within normal limits Gait & Station: normal Patient leans: N/A  Psychiatric Specialty Exam: ROS  There were no vitals taken for this visit.There is no height or weight on file to calculate BMI.  General Appearance: Fairly Groomed  Eye Contact:  Good  Speech:  Clear and Coherent  Volume:  Normal  Mood:  {BHH MOOD:22306}  Affect:  {Affect (PAA):22687}  Thought Process:  Coherent and Goal Directed  Orientation:  Full (Time, Place, and Person)  Thought Content: Logical   Suicidal Thoughts:  {ST/HT (PAA):22692}  Homicidal Thoughts:  {ST/HT (PAA):22692}  Memory:  Immediate;   Good Recent;   Good Remote;   Good  Judgement:  {Judgement (PAA):22694}  Insight:  {Insight (PAA):22695}  Psychomotor Activity:  Normal  Concentration:  Concentration: Good and Attention Span: Good  Recall:  Good  Fund of Knowledge: Good  Language:  Good  Akathisia:  No  Handed:  Right  AIMS (if indicated): not done  Assets:  Communication Skills Desire for Improvement  ADL's:  Intact  Cognition: WNL  Sleep:  {BHH GOOD/FAIR/POOR:22877}   Screenings: GAD-7     Office Visit from 01/31/2015 in [facility]  Total GAD-7 Score  15    Mini-Mental     Office Visit from 02/20/2014 in [facility]  Total Score (max 30 points )  30    PHQ2-9     Office Visit from 12/01/2016 in [facility] Visit from 11/07/2016 in [facility] Visit from 09/05/2016 in [facility] Visit from 07/07/2016 in [facility] Visit from 05/19/2016 in [facility]  PHQ-2 Total Score  0  0  0  0  0       Assessment and Plan:  [name] is a 51 y.o. year old female with a history of depression, partial symptomatic epilepsy with complex partial seizures, osteoarthritis, congenital hip dislocation s/p replacement, who presents for follow up appointment for No diagnosis found.   # MDD, moderate, recurrent without psychotic features  Although there has been overall improvement in neurovegetative symptoms, she continues to have anxiety which interferes her life. Will uptitrate mirtazapine to target residual mood symptoms. Will continue fluoxetine to target depression. Will continue clonazepam prn for anxiety and trazodone for sleep. Discussed behavioral activation.   Plan 1. Continue fluoxetine 60 mg daily  2. Increase mirtazapine 45 mg at night 3. Continue clonazepam 0.5 mg daily as needed for anxiety (she has one refill left- ordered another for a month to last until the next appointment) 4. Continue Trazodone 150 mg at night for sleep 5. Return to clinic in two months  Patient is on gabapentin, Lamictal, Keppra for seizure. She is also on pramipexole for restless leg  The patient  demonstrates the following risk factors for suicide: Chronic risk factors for suicide include: psychiatric disorder of depressionand previous suicide attempts of overdosing medication. Acute risk factorsfor suicide include: unemployment  and her husband with terminal illness. Protective factorsfor this patient include: positive social support, coping skills and hope for the future. Considering these factors, the overall suicide risk at this point appears to be low. Patient isappropriate for outpatient follow up.     Norman Clay, MD 12/24/2016, 11:25 AM

## 2016-12-29 ENCOUNTER — Ambulatory Visit (HOSPITAL_COMMUNITY): Payer: PPO | Admitting: Psychiatry

## 2016-12-30 ENCOUNTER — Encounter (HOSPITAL_COMMUNITY): Payer: Self-pay | Admitting: Psychiatry

## 2017-01-09 ENCOUNTER — Encounter: Payer: Self-pay | Admitting: Family Medicine

## 2017-01-09 ENCOUNTER — Ambulatory Visit: Payer: PPO | Admitting: Family Medicine

## 2017-01-09 VITALS — BP 106/73 | HR 70 | Temp 98.0°F | Ht 60.0 in | Wt 164.0 lb

## 2017-01-09 DIAGNOSIS — L509 Urticaria, unspecified: Secondary | ICD-10-CM

## 2017-01-09 MED ORDER — PRAMIPEXOLE DIHYDROCHLORIDE 0.5 MG PO TABS: 0.5000 mg | ORAL_TABLET | Freq: Two times a day (BID) | ORAL | 0 refills | Status: DC

## 2017-01-09 MED ORDER — CLONAZEPAM 0.5 MG PO TABS: 0.5000 mg | ORAL_TABLET | Freq: Every day | ORAL | 0 refills | Status: DC | PRN

## 2017-01-09 MED ORDER — PREDNISONE 10 MG PO TABS: ORAL_TABLET | ORAL | 0 refills | Status: DC

## 2017-01-09 MED ORDER — METHYLPREDNISOLONE ACETATE 80 MG/ML IJ SUSP
60.0000 mg | Freq: Once | INTRAMUSCULAR | Status: AC
  Administered 2017-01-09: 60 mg via INTRAMUSCULAR

## 2017-01-09 NOTE — Patient Instructions (Signed)
Take prednisone as directed and until completed Take Benadryl 25 mg every 4-6 hours as needed for itching Continue with Dove soap as long as it is unscented Change detergent and fabric softener to unscented brand like all, and unscented fabric softener.

## 2017-01-09 NOTE — Progress Notes (Signed)
Subjective:    Patient ID: [name], female    DOB: 1965/12/25, 51 y.o.   MRN: [id number]  HPI Patient here today for a rash that was first noticed last week.  The patient was on vancomycin for pneumonia from November to about the middle of December.  Just last week she broke out with a rash and she had been off the antibiotic for a good while.  She does admit to having sensitive skin.  Most of the rash is confined to her trunk and mostly to her front side and not her backside.  She complains mostly of itching.  She denies any shortness of breath chest pain GI symptoms or voiding symptoms.  She uses [employer] and uses gain detergent and gain fabric softener.    Patient Active Problem List   Diagnosis Date Noted  . Seizure ([place]) 11/14/2016  . CAP (community acquired pneumonia) 11/09/2016  . 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 Moderate episode of recurrent major depressive disorder ([place]) 12/25/2015  . Bereavement 12/25/2015  . HCAP (healthcare-associated pneumonia) 11/28/2015  . Multifocal pneumonia 11/28/2015  . Partial symptomatic epilepsy with complex partial seizures, not intractable, with status epilepticus ([place]) 07/24/2015  . Insomnia w/ sleep apnea 07/24/2015  . OA (osteoarthritis) of knee 05/07/2015  . Memory loss 02/20/2014  . Hypokalemia 06/23/2011  . Moderate major depression ([place]) 10/14/2010   Outpatient Encounter Medications as of 01/09/2017  Medication Sig  . clonazePAM (KLONOPIN) 0.5 MG tablet Take 1 tablet (0.5 mg total) by mouth daily as needed for anxiety.  Marland Kitchen FLUoxetine HCl 60 MG TABS Take 60 mg by mouth daily.  . fluticasone (FLONASE) 50 MCG/ACT nasal spray Place 2 sprays into both nostrils daily. (Patient taking differently: Place 2 sprays into both nostrils daily as needed for allergies. )  . gabapentin (NEURONTIN) 600 MG tablet TAKE 1 TABLET BY MOUTH THREE TIMES DAILY  . lamoTRIgine (LAMICTAL) 100 MG tablet Take 1 tablet (100 mg total) by mouth 2 (two) times  daily.  Marland Kitchen levETIRAcetam (KEPPRA) 500 MG tablet Take 3 tablets (1,500 mg total) by mouth 2 (two) times daily.  . pramipexole (MIRAPEX) 0.5 MG tablet TAKE ONE TABLET BY MOUTH TWICE DAILY  . [DISCONTINUED] cyclobenzaprine (FLEXERIL) 10 MG tablet Take 1 tablet (10 mg total) by mouth 3 (three) times daily as needed for muscle spasms. (Patient not taking: Reported on 12/01/2016)  . [DISCONTINUED] loratadine (CLARITIN) 10 MG tablet Take 1 tablet (10 mg total) by mouth daily.  . [DISCONTINUED] mirtazapine (REMERON) 45 MG tablet Take 1 tablet (45 mg total) by mouth at bedtime.  . [DISCONTINUED] traZODone (DESYREL) 150 MG tablet TAKE 1 TABLET BY MOUTH AT BEDTIME   No facility-administered encounter medications on file as of 01/09/2017.       Review of Systems  Constitutional: Negative.   HENT: Negative.   Eyes: Negative.   Respiratory: Negative.   Cardiovascular: Negative.   Gastrointestinal: Negative.   Endocrine: Negative.   Genitourinary: Negative.   Musculoskeletal: Negative.   Skin: Positive for rash.  Allergic/Immunologic: Negative.   Neurological: Negative.   Hematological: Negative.   Psychiatric/Behavioral: Negative.        Objective:   Physical Exam  Constitutional: She is oriented to person, place, and time. She appears well-developed and well-nourished.  HENT:  Head: Normocephalic and atraumatic.  Eyes: Conjunctivae and EOM are normal. Pupils are equal, round, and reactive to light. Right eye exhibits no discharge. Left eye exhibits no discharge. No scleral icterus.  Neck: Normal range  of motion.  Cardiovascular: Normal rate, regular rhythm and normal heart sounds.  No murmur heard. Pulmonary/Chest: Effort normal and breath sounds normal. She has no wheezes. She has no rales.  Musculoskeletal: Normal range of motion. She exhibits no edema.  Neurological: She is alert and oriented to person, place, and time.  Skin: Skin is warm and dry. Rash noted. There is erythema. No  pallor.  Sparse rash on abdomen and chest anteriorly and very minimal rash on the posterior aspects of the abdomen and chest.  Psychiatric: She has a normal mood and affect. Her behavior is normal. Judgment and thought content normal.  Nursing note and vitals reviewed.  BP 106/73 (BP Location: Right Arm)   Pulse 70   Temp 98 F (36.7 C) (Oral)   Ht 5' (1.524 m)   Wt 164 lb (74.4 kg)   BMI 32.03 kg/m         Assessment & Plan:  1. Urticaria -Take Benadryl 25 mg every 4-6 hours as needed for itching -Take prednisone taper #24 times a day for 2 days 3 times a day for 2 days twice daily for 2 days and once daily for 2 days until completed -Change detergent to scent free detergent like all and fabric softener to scent free fabric softener.  No orders of the defined types were placed in this encounter.  Patient Instructions  Take prednisone as directed and until completed Take Benadryl 25 mg every 4-6 hours as needed for itching Continue with Dove soap as long as it is unscented Change detergent and fabric softener to unscented brand like all, and unscented fabric softener.  Arrie Senate MD

## 2017-01-29 ENCOUNTER — Telehealth: Payer: Self-pay | Admitting: Family Medicine

## 2017-01-29 ENCOUNTER — Ambulatory Visit: Payer: PPO | Admitting: Pediatrics

## 2017-01-29 NOTE — Telephone Encounter (Signed)
Pt given appt with Dr Evette Doffing at 3 today.

## 2017-01-30 ENCOUNTER — Ambulatory Visit (INDEPENDENT_AMBULATORY_CARE_PROVIDER_SITE_OTHER): Payer: PPO | Admitting: Family Medicine

## 2017-01-30 ENCOUNTER — Ambulatory Visit (INDEPENDENT_AMBULATORY_CARE_PROVIDER_SITE_OTHER): Payer: PPO

## 2017-01-30 ENCOUNTER — Encounter: Payer: Self-pay | Admitting: Family Medicine

## 2017-01-30 VITALS — BP 122/84 | HR 68 | Temp 98.8°F | Ht 60.0 in | Wt 156.0 lb

## 2017-01-30 DIAGNOSIS — M546 Pain in thoracic spine: Secondary | ICD-10-CM | POA: Diagnosis not present

## 2017-01-30 DIAGNOSIS — N200 Calculus of kidney: Secondary | ICD-10-CM | POA: Diagnosis not present

## 2017-01-30 DIAGNOSIS — Z87442 Personal history of urinary calculi: Secondary | ICD-10-CM

## 2017-01-30 DIAGNOSIS — M545 Low back pain, unspecified: Secondary | ICD-10-CM

## 2017-01-30 LAB — URINALYSIS, COMPLETE
Bilirubin, UA: NEGATIVE
Glucose, UA: NEGATIVE
Ketones, UA: NEGATIVE
Leukocytes, UA: NEGATIVE
Nitrite, UA: NEGATIVE
Protein, UA: NEGATIVE
RBC, UA: NEGATIVE
Specific Gravity, UA: 1.015 (ref 1.005–1.030)
Urobilinogen, Ur: 0.2 mg/dL (ref 0.2–1.0)
pH, UA: 7 (ref 5.0–7.5)

## 2017-01-30 LAB — MICROSCOPIC EXAMINATION
Epithelial Cells (non renal): >10 /hpf — AB (ref 0–10)
RBC, UA: NONE SEEN /hpf (ref 0–?)
Renal Epithel, UA: NONE SEEN /hpf

## 2017-01-30 MED ORDER — OXYCODONE HCL 5 MG PO TABS
5.0000 mg | ORAL_TABLET | ORAL | 0 refills | Status: DC | PRN
Start: 2017-01-30 — End: 2017-02-04

## 2017-01-30 NOTE — Patient Instructions (Signed)
You took oxycodone in November of last year.  You seemed to have tolerated this well.  I have prescribed you a small amount of this.  Please schedule an appointment with urology for further evaluation of your kidney stones, as there appears to be 3 visible on the left side on your x-ray today.  If you develop worsening symptoms, blood in your urine, nausea, vomiting, fevers, severe back pain, please seek immediate medical attention in the emergency department.

## 2017-01-30 NOTE — Progress Notes (Signed)
[name], [name], [name] concerned about seizure (Per chart review, Remeron was instructed to continue per discharge summary). She reports that she wants to get evaluation so that other people can handle her finances. She is concerned that she tends to spend money despite she is having limited budget. She also tends to get "confused"; not able to process information well when she talks with other people. She has difficulty with memory. She states that things are going well otherwise; her husband is on lung transplant. Her son started his work the other day.  She reports insomnia after being off mirtazapine Monica trazodone.  She has less fatigue.  She feels depressed at times.  She has more motivation Monica energy.  She denies SI.  She tends to be anxious Monica tense.  She denies panic attacks.   Functional Status Instrumental Activities of Daily Living (IADLs):  [name] is independent in the following: cooking Requires assistance with the following: medication (her mother helps her), managing finances (husband Monica her son helps her), driving (unable to drive due to epilepsy for many years)  Activities of Daily Living (ADLs):  [name] is independent in the following: bathing Monica hygiene, feeding, continence, grooming Monica toileting, walking  Per PMP Clonazepam last filled on 01/09/2017 I have utilized the  Controlled Substances Reporting System (PMP AWARxE) to confirm adherence regarding the Monica's medication. My review reveals appropriate prescription fills.   Visit Diagnosis:    ICD-10-CM   1. Moderate  episode of recurrent major depressive disorder (HCC) F33.1   2. Mild neurocognitive disorder G31.84     Past Psychiatric History:  I have reviewed the Monica's psychiatry history in detail Monica updated the Monica record. Outpatient: [facility],  Psychiatry admission: in 2004 for suicide attempt (per note, she was also admitted in 09/04/2010-09/06/2010)  Previous suicide attempt: overdosing flexeril in 2004,  Past trials of medication: fluoxetine, citalopram, Trazodone, lorazepam, clonazepam History of violence: denies   Past Medical History:  Past Medical History:  Diagnosis Date  . Arthritis    "knees" (04/22/2012)  . Bronchitis   . Chronic bronchitis ([place])    "yearly; when the weather changes" (04/22/2012)  . Chronic kidney disease    kidney stones  . Colon polyps    adenomatous Monica hyperplastic-  . Constipation   . Depression   . Eczema   . Epilepsy ([place])    "been having them right often here lately" (04/22/2012)  . Fatty liver   . KGYJEHUD(149.7)    "1/wk" (04/22/2012)  . High cholesterol   . History of kidney stones   . Osteoarthritis    [name] 04/22/2012  . Other convulsions 05/21/12   non-epileptic spells  . Rectal bleed    in toilet- bright red  . Restless leg   . Seizures ([place])   . Vertigo     Past Surgical History:  Procedure Laterality Date  . ABDOMINAL HYSTERECTOMY  2001  . BLADDER SUSPENSION    . BUNIONECTOMY Left 2000  . CESAREAN SECTION  1987; 1988  . COLONOSCOPY    . JOINT REPLACEMENT    .  MASS EXCISION  10/22/2011   Procedure: EXCISION MASS;  Surgeon: [name], MD;  Location: [facility];  Service: General;  Laterality: Right;  excision right buttock mass  . TOTAL HIP ARTHROPLASTY Left 1993; 1995; 2000  . TOTAL KNEE ARTHROPLASTY Left 05/07/2015   Procedure: TOTAL LEFT KNEE ARTHROPLASTY;  Surgeon: [name], MD;  Location: [facility];  Service: Orthopedics;  Laterality: Left;  . TOTAL KNEE ARTHROPLASTY Right 10/01/2015   Procedure: RIGHT TOTAL KNEE  ARTHROPLASTY;  Surgeon: [name], MD;  Location: [facility];  Service: Orthopedics;  Laterality: Right;    Family Psychiatric History: I have reviewed the Monica's family history in detail Monica updated the Monica record.  Family History:  Family History  Problem Relation Age of Onset  . Cancer Mother   . Depression Father   . Cancer Brother   . Diabetes Brother   . Cancer Maternal Aunt   . Diabetes Maternal Aunt   . Bipolar disorder Son   . Drug abuse Son     Social History:  Social History   Socioeconomic History  . Marital status: Married    Spouse name: [name]  . Number of children: 2  . Years of education: 12 +  . Highest education level: None  Social Needs  . Financial resource strain: None  . Food insecurity - worry: None  . Food insecurity - inability: None  . Transportation needs - medical: None  . Transportation needs - non-medical: None  Occupational History  . Occupation: unemployed    Employer: OTHER  Tobacco Use  . Smoking status: Never Smoker  . Smokeless tobacco: Never Used  Substance Monica Sexual Activity  . Alcohol use: No    Alcohol/week: 0.0 oz    Comment: 12-25-2015 per pt no  . Drug use: No    Comment: 21-12-2015 per pt no   . Sexual activity: Not Currently    Birth control/protection: Surgical  Other Topics Concern  . None  Social History Narrative   Monica is married [name]) Monica lives at home with her husband.   Monica has two adult children.   Monica is disabled.   Monica has a high school education Monica some trade school.   Monica is right-handed.   Monica drinks four glasses of soda Monica tea daily.    Allergies:  Allergies  Allergen Reactions  . Acetaminophen Other (See Comments)    Has fatty deposits on liver  . Benadryl [Diphenhydramine Hcl] Other (See Comments)    hyperactivity Monica seizures  . Codeine Itching Monica Rash    seizures  . Dilantin [Phenytoin Sodium Extended] Other (See Comments)    Elevated LFT's  .  Melatonin Other (See Comments)    seizures  . Ultram [Tramadol Hcl] Other (See Comments)    Seizures  . Vimpat [Lacosamide] Other (See Comments)    Severe dizziness  . Betadine [Povidone Iodine] Rash  . Latex Rash  . Penicillins Itching Monica Rash    Has Monica had a PCN reaction causing immediate rash, facial/tongue/throat swelling, SOB or lightheadedness with hypotension: No Has Monica had a PCN reaction causing severe rash involving mucus membranes or skin necrosis: No Has Monica had a PCN reaction that required hospitalization No Has Monica had a PCN reaction occurring within the last 10 years: No If all of the above answers are "NO", then may proceed with Cephalosporin use.  . Sulfa Antibiotics Rash  . Tape Rash    Paper tape please  . Vicodin [Hydrocodone-Acetaminophen] Itching  Metabolic Disorder Labs: Lab Results  Component Value Date   HGBA1C 5.4 11/28/2015   MPG 108 11/28/2015   No results found for: PROLACTIN Lab Results  Component Value Date   CHOL 164 07/07/2016   TRIG 124 07/07/2016   HDL 53 07/07/2016   CHOLHDL 3.1 07/07/2016   LDLCALC 86 07/07/2016   LDLCALC 136 (H) 04/09/2015   Lab Results  Component Value Date   TSH 1.900 07/07/2016   TSH 2.559 12/03/2015    Therapeutic Level Labs: No results found for: LITHIUM No results found for: VALPROATE No components found for:  CBMZ  Current Medications: Current Outpatient Medications  Medication Sig Dispense Refill  . amoxicillin-clavulanate (AUGMENTIN) 875-125 MG tablet Take 1 tablet by mouth 2 (two) times daily. 20 tablet 0  . clonazePAM (KLONOPIN) 0.5 MG tablet Take 1 tablet (0.5 mg total) by mouth daily as needed for anxiety. 30 tablet 2  . FLUoxetine HCl 60 MG TABS Take 60 mg by mouth daily. 90 tablet 0  . gabapentin (NEURONTIN) 600 MG tablet Take 1 tablet (600 mg total) by mouth 3 (three) times daily. 90 tablet 5  . lamoTRIgine (LAMICTAL) 100 MG tablet Take 1 tablet (100 mg total) by mouth 2  (two) times daily. 60 tablet 11  . levETIRAcetam (KEPPRA) 500 MG tablet Take 3 tablets (1,500 mg total) by mouth 2 (two) times daily. (Monica taking differently: Take 500 mg by mouth 2 (two) times daily. ) 180 tablet 11  . oxyCODONE (OXY IR/ROXICODONE) 5 MG immediate release tablet Take 1 tablet (5 mg total) by mouth every 4 (four) hours as needed for severe pain. 12 tablet 0  . pramipexole (MIRAPEX) 0.5 MG tablet Take 1 tablet (0.5 mg total) by mouth 2 (two) times daily. 60 tablet 5  . mirtazapine (REMERON) 7.5 MG tablet Take 1 tablet (7.5 mg total) by mouth at bedtime. 90 tablet 0   No current facility-administered medications for this visit.      Musculoskeletal: Strength & Muscle Tone: within normal limits Gait & Station: normal Monica leans: N/A  Psychiatric Specialty Exam: Review of Systems  Psychiatric/Behavioral: Positive for depression Monica memory loss. Negative for hallucinations, substance abuse Monica suicidal ideas. The Monica is nervous/anxious Monica has insomnia.   All other systems reviewed Monica are negative.   Blood pressure 112/75, pulse 68, height 5' (1.524 m), weight 156 lb (70.8 kg), SpO2 98 %.Body mass index is 30.47 kg/m.  General Appearance: Fairly Groomed  Eye Contact:  Good  Speech:  Clear Monica Coherent  Volume:  Normal  Mood:  "fine"  Affect:  Appropriate, Congruent Monica euthymic  Thought Process:  Coherent Monica Goal Directed  Orientation:  Full (Time, Place, Monica Person)  Thought Content: Logical   Suicidal Thoughts:  No  Homicidal Thoughts:  No  Memory:  Immediate;   Fair  Judgement:  Good  Insight:  Fair  Psychomotor Activity:  Normal  Concentration:  Concentration: Good Monica Attention Span: Good  Recall:  [place] of Knowledge: Good  Language: Good  Akathisia:  No  Handed:  Right  AIMS (if indicated): not done  Assets:  Communication Skills Desire for Improvement  ADL's:  Intact  Cognition: WNL  Sleep:  Poor   Screenings: GAD-7     Office  Visit from 01/31/2015 in [facility]  Total GAD-7 Score  15    Mini-Mental     Office Visit from 02/20/2014 in [facility]  Total Score (max 30 points )  59    PHQ2-9     Office Visit from 02/03/2017 in [facility] Visit from 01/30/2017 in [place] Visit from 01/09/2017 in [facility] Visit from 12/01/2016 in [facility] Visit from 11/07/2016 in [facility]  PHQ-2 Total Score  2  0  1  0  0  PHQ-9 Total Score  13  No data  No data  No data  No data     MOCA 25/30 on 02/04/17: -1 for visuospatial (unable to copy cube), -2 for attention, -2 for delayed recall  Assessment Monica Plan:  MARIROSE DEVENEY is a 52 y.o. year old female with a history of depression, pseudoseizure, partial symptomatic epilepsy with complex partial seizures, osteoarthritis, congenital hip dislocation s/p replacement , who presents for follow up [name] for Moderate episode of recurrent major depressive disorder ([place])  Mild neurocognitive disorder  # MDD, moderate, recurrent without psychotic features Monica reports overall improvement in neurovegetative symptoms since the last [name]. She has been off mirtazapine, trazodone with the thought that her doctor discontinued it (contrary to discharge summary instruction).  Will continue fluoxetine to target depression.  We will restart mirtazapine as adjunctive treatment for depression Monica insomnia given Monica preference.  Will continue clonazepam as needed for anxiety.  Will hold trazodone at this time given Monica preference.   # Mild neurocognitive disorder Monica has mild cognitive impairment as in [place]. Will repeat MOCA as indicated. Will consider Aricept if any worsening in her symptoms. She is advised to contact the social security to inquire process if she is interested in  appointing a payee.   Plan I have reviewed Monica updated plans as below 1. Continue fluoxetine 60 mg daily  2. Restart mirtazapine 15 mg at night 3. Continue clonazepam 0.5 mg daily as needed for anxiety  4. Return to clinic in three months for 15 mins (Discontinued trazodone) Monica is on gabapentin, Lamictal, Keppra for seizure. She is also on pramipexole for restless leg  The Monica demonstrates the following risk factors for suicide: Chronic risk factors for suicide include: psychiatric disorder of depressionand previous suicide attempts of overdosing medication. Acute risk factorsfor suicide include: unemployment Monica her husband with terminal illness. Protective factorsfor this Monica include: positive social support, coping skills Monica hope for the future. Considering these factors, the overall suicide risk at this point appears to be low. Monica isappropriate for outpatient follow up.  The duration of this [name] visit was 30 minutes of face-to-face time with the Monica.  Greater than 50% of this time was spent in counseling, explanation of  diagnosis, planning of further management, Monica coordination of care.   Norman Clay, MD 02/04/2017, 8:40 AM

## 2017-01-30 NOTE — Progress Notes (Signed)
Subjective: GH:WEXHBZ spasm? PCP: [name], MD JIR:CVELF A Dike is a 52 y.o. female presenting to clinic today for:  1. Back pain Patient reports that she developed left-sided pain about 1 week ago.  She notes that the pain is stabbing and will take her breath away.  She reports a history of kidney stones.  She does have a urologist for this.  She denies dysuria but does report increased urinary frequency, no hematuria, urgency.  No nausea, vomiting, abdominal pain, fevers.  She denies lower extremity weakness, numbness, tingling, saddle anesthesia, urinary retention or fecal incontinence.  He has not had any falls.  No preceding injury.  Pain is not worse with any particular movement.  Pain is constant.  She is used ice applied to affected area with little improvement.  Symptoms seem to be getting slightly worse over the last week.  She has not used any over-the-counter medications for this as she tries to avoid Tylenol because of known fatty liver disease and elevations in her liver function tests.  She reports that her last kidney stone was a while back.  Past surgical history remarkable for 3 left hip replacements.   ROS: Per HPI  Allergies  Allergen Reactions  . Acetaminophen Other (See Comments)    Has fatty deposits on liver  . Benadryl [Diphenhydramine Hcl] Other (See Comments)    hyperactivity and seizures  . Codeine Itching and Rash    seizures  . Dilantin [Phenytoin Sodium Extended] Other (See Comments)    Elevated LFT's  . Melatonin Other (See Comments)    seizures  . Ultram [Tramadol Hcl] Other (See Comments)    Seizures  . Vimpat [Lacosamide] Other (See Comments)    Severe dizziness  . Betadine [Povidone Iodine] Rash  . Latex Rash  . Penicillins Itching and Rash    Has patient had a PCN reaction causing immediate rash, facial/tongue/throat swelling, SOB or lightheadedness with hypotension: No Has patient had a PCN reaction causing severe rash involving mucus  membranes or skin necrosis: No Has patient had a PCN reaction that required hospitalization No Has patient had a PCN reaction occurring within the last 10 years: No If all of the above answers are "NO", then may proceed with Cephalosporin use.  . Sulfa Antibiotics Rash  . Tape Rash    Paper tape please  . Vicodin [Hydrocodone-Acetaminophen] Itching   Past Medical History:  Diagnosis Date  . Arthritis    "knees" (04/22/2012)  . Bronchitis   . Chronic bronchitis ([place])    "yearly; when the weather changes" (04/22/2012)  . Chronic kidney disease    kidney stones  . Colon polyps    adenomatous and hyperplastic-  . Constipation   . Depression   . Eczema   . Epilepsy ([place])    "been having them right often here lately" (04/22/2012)  . Fatty liver   . YBOFBPZW(258.5)    "1/wk" (04/22/2012)  . High cholesterol   . History of kidney stones   . Osteoarthritis    [name] 04/22/2012  . Other convulsions 05/21/12   non-epileptic spells  . Rectal bleed    in toilet- bright red  . Restless leg   . Seizures ([place])   . Vertigo     Current Outpatient Medications:  .  clonazePAM (KLONOPIN) 0.5 MG tablet, Take 1 tablet (0.5 mg total) by mouth daily as needed for anxiety., Disp: 30 tablet, Rfl: 0 .  FLUoxetine HCl 60 MG TABS, Take 60 mg by mouth daily., Disp:  30 tablet, Rfl: 1 .  fluticasone (FLONASE) 50 MCG/ACT nasal spray, Place 2 sprays into both nostrils daily. (Patient taking differently: Place 2 sprays into both nostrils daily as needed for allergies. ), Disp: 16 g, Rfl: 6 .  gabapentin (NEURONTIN) 600 MG tablet, TAKE 1 TABLET BY MOUTH THREE TIMES DAILY, Disp: 90 tablet, Rfl: 2 .  lamoTRIgine (LAMICTAL) 100 MG tablet, Take 1 tablet (100 mg total) by mouth 2 (two) times daily., Disp: 60 tablet, Rfl: 11 .  levETIRAcetam (KEPPRA) 500 MG tablet, Take 3 tablets (1,500 mg total) by mouth 2 (two) times daily., Disp: 180 tablet, Rfl: 11 .  pramipexole (MIRAPEX) 0.5 MG tablet, Take 1 tablet (0.5 mg  total) by mouth 2 (two) times daily., Disp: 60 tablet, Rfl: 0 .  predniSONE (DELTASONE) 10 MG tablet, Take 1 tab QiD x 2 days, then 1 tab TID x 2 days, then 1 tab BID x 2 days, then 1 tab QD x 2 days., Disp: 20 tablet, Rfl: 0 Social History   Socioeconomic History  . Marital status: Married    Spouse name: [name]  . Number of children: 2  . Years of education: 12 +  . Highest education level: Not on file  Social Needs  . Financial resource strain: Not on file  . Food insecurity - worry: Not on file  . Food insecurity - inability: Not on file  . Transportation needs - medical: Not on file  . Transportation needs - non-medical: Not on file  Occupational History  . Occupation: unemployed    Employer: OTHER  Tobacco Use  . Smoking status: Never Smoker  . Smokeless tobacco: Never Used  Substance and Sexual Activity  . Alcohol use: No    Alcohol/week: 0.0 oz    Comment: 12-25-2015 per pt no  . Drug use: No    Comment: 21-12-2015 per pt no   . Sexual activity: Not Currently    Birth control/protection: Surgical  Other Topics Concern  . Not on file  Social History Narrative   Patient is married [name]) and lives at home with her husband.   Patient has two adult children.   Patient is disabled.   Patient has a high school education and some trade school.   Patient is right-handed.   Patient drinks four glasses of soda and tea daily.   Family History  Problem Relation Age of Onset  . Cancer Mother   . Depression Father   . Cancer Brother   . Diabetes Brother   . Cancer Maternal Aunt   . Diabetes Maternal Aunt   . Bipolar disorder Son   . Drug abuse Son     Objective: Office vital signs reviewed. BP 122/84   Pulse 68   Temp 98.8 F (37.1 C) (Oral)   Ht 5' (1.524 m)   Wt 156 lb (70.8 kg)   BMI 30.47 kg/m   Physical Examination:  General: Awake, alert, well nourished, well appearing female, No acute distress HEENT: MMM, sclera white GU: No suprapubic tenderness  to palpation MSK: Gait is stiff with rising from seated position.  She ambulates independently.  Patient has full active range of motion.  Some pain with rotation to the left.  She has notable kyphosis with a left-sided prominence in the left paraspinals.  She has left-sided CVA tenderness to palpation.  Negative straight leg raise bilaterally. Neuro: Light touch sensation grossly intact. 5/5 lower extremity strength.  No results found.   Assessment/ Plan: 52 y.o. female  1. Acute left-sided thoracic back pain Urinalysis negative for evidence of infection.  She did have very few bacteria on urine microscopy but she also had greater than 10 epithelial cells suggesting a dirty catch.  KUB was obtained.  Personal review reveals at least 3 radiopaque areas along the left side near the kidneys.  I am suspicious for renal stones.  However, fecaliths could also appear this way.  Given her strong history, we discussed initiation of Flomax and an oral pain medication not containing Tylenol.  Her insurance actually does not pay for Flomax so this was not prescribed.  She does have a urologist, who I did recommend that she call for an appointment.  She was prescribed oxycodone 5 mg, as she is tolerated this well in the past. The Narcotic Database has been reviewed.  There were no red flags.  Home care instructions were reviewed with the patient.  Recommended Colace while on pain medication. Strict return precautions and reasons for emergent evaluation in the emergency department review with patient.  They voiced understanding and will follow-up as needed.  Will contact her with formal review/read by radiology once this is available. - Urinalysis, Complete - DG Abd 1 View; Future - Basic Metabolic Panel  2. History of renal stone - Urinalysis, Complete - DG Abd 1 View; Future - Basic Metabolic Panel  3. Nephrolithiasis   Orders Placed This Encounter  Procedures  . Microscopic Examination  . DG Abd 1  View    Standing Status:   Future    Number of Occurrences:   1    Standing Expiration Date:   04/01/2018    Order Specific Question:   Reason for Exam (SYMPTOM  OR DIAGNOSIS REQUIRED)    Answer:   back pain    Order Specific Question:   Is the patient pregnant?    Answer:   No    Order Specific Question:   Preferred imaging location?    Answer:   Internal  . Urinalysis, Complete  . Basic Metabolic Panel   Meds ordered this encounter  Medications  . oxyCODONE (OXY IR/ROXICODONE) 5 MG immediate release tablet    Sig: Take 1 tablet (5 mg total) by mouth every 4 (four) hours as needed for severe pain.    Dispense:  12 tablet    Refill:  [place], DO [place] [id number]

## 2017-02-02 ENCOUNTER — Telehealth: Payer: Self-pay | Admitting: Family Medicine

## 2017-02-02 NOTE — Telephone Encounter (Signed)
She can take 1.5 of these at a time if she needs.  If she has run out, she will need to be seen since what she was given is a scheduled 2 substance.

## 2017-02-03 ENCOUNTER — Encounter: Payer: Self-pay | Admitting: Family Medicine

## 2017-02-03 ENCOUNTER — Ambulatory Visit (INDEPENDENT_AMBULATORY_CARE_PROVIDER_SITE_OTHER): Payer: PPO | Admitting: Family Medicine

## 2017-02-03 VITALS — BP 99/67 | HR 69 | Temp 97.2°F | Ht 60.0 in | Wt 157.0 lb

## 2017-02-03 DIAGNOSIS — J189 Pneumonia, unspecified organism: Secondary | ICD-10-CM

## 2017-02-03 DIAGNOSIS — F321 Major depressive disorder, single episode, moderate: Secondary | ICD-10-CM | POA: Diagnosis not present

## 2017-02-03 DIAGNOSIS — N200 Calculus of kidney: Secondary | ICD-10-CM

## 2017-02-03 DIAGNOSIS — H6505 Acute serous otitis media, recurrent, left ear: Secondary | ICD-10-CM | POA: Diagnosis not present

## 2017-02-03 MED ORDER — AMOXICILLIN-POT CLAVULANATE 875-125 MG PO TABS: 1.0000 | ORAL_TABLET | Freq: Two times a day (BID) | ORAL | 0 refills | Status: DC

## 2017-02-03 MED ORDER — GABAPENTIN 600 MG PO TABS: 600.0000 mg | ORAL_TABLET | Freq: Three times a day (TID) | ORAL | 5 refills | Status: DC

## 2017-02-03 MED ORDER — PRAMIPEXOLE DIHYDROCHLORIDE 0.5 MG PO TABS: 0.5000 mg | ORAL_TABLET | Freq: Two times a day (BID) | ORAL | 5 refills | Status: DC

## 2017-02-03 NOTE — Progress Notes (Signed)
Subjective:  Patient ID: [name], female    DOB: [birth year]  Age: 52 y.o. MRN: [id number]  CC: Follow-up (pt here today for routine follow up of her chronic medical conditions)   HPI FONTAINE KOSSMAN presents for she says that she is got pain in the left ear.  It is been ongoing for about a month.  It has not affected her hearing.  Depression seems to be stable at this point she has a PHQ score noted below.  She is very worried about her husband who is having worse respiratory problems and is being considered for lung transplant.  She did have a kidney stone 4 days ago and was given a small amount of oxycodone by Dr. Lajuana Ripple.  She is gone through that and the pain has returned.  She would like to have a refill.  Stone pain is quite severe when it occurs as the stones move.  Dr. Marjean Donna note was reviewed. Depression screen [facility] 2/9 02/03/2017 01/30/2017 01/09/2017  Decreased Interest 1 0 0  Down, Depressed, Hopeless 1 0 1  PHQ - 2 Score 2 0 1  Altered sleeping 0 - -  Tired, decreased energy 1 - -  Change in appetite 2 - -  Feeling bad or failure about yourself  2 - -  Trouble concentrating 3 - -  Moving slowly or fidgety/restless 3 - -  Suicidal thoughts 0 - -  PHQ-9 Score 13 - -  Difficult doing work/chores - - -  Some recent data might be hidden    History Madisson has a past medical history of Arthritis, Bronchitis, Chronic bronchitis ([place]), Chronic kidney disease, Colon polyps, Constipation, Depression, Eczema, Epilepsy ([place]), Fatty liver, Headache(784.0), High cholesterol, History of kidney stones, Osteoarthritis, Other convulsions (05/21/12), Rectal bleed, Restless leg, Seizures ([place]), and Vertigo.   She has a past surgical history that includes Total hip arthroplasty (Left, 1993; 1995; 2000); Abdominal hysterectomy (2001); Colonoscopy; Joint replacement; Mass excision (10/22/2011); Bladder suspension; Cesarean section (5643; 1988); Bunionectomy (Left, 2000); Total knee  arthroplasty (Left, 05/07/2015); and Total knee arthroplasty (Right, 10/01/2015).   Her family history includes Bipolar disorder in her son; Cancer in her brother, maternal aunt, and mother; Depression in her father; Diabetes in her brother and maternal aunt; Drug abuse in her son.She reports that  has never smoked. she has never used smokeless tobacco. She reports that she does not drink alcohol or use drugs.    ROS Review of Systems  Constitutional: Negative for activity change, appetite change and fever.  HENT: Negative for congestion, rhinorrhea and sore throat.   Eyes: Negative for visual disturbance.  Respiratory: Negative for cough and shortness of breath.   Cardiovascular: Negative for chest pain and palpitations.  Gastrointestinal: Negative for abdominal pain, diarrhea and nausea.  Genitourinary: Negative for dysuria.  Musculoskeletal: Negative for arthralgias and myalgias.    Objective:  BP 99/67   Pulse 69   Temp (!) 97.2 F (36.2 C) (Oral)   Ht 5' (1.524 m)   Wt 157 lb (71.2 kg)   BMI 30.66 kg/m   BP Readings from Last 3 Encounters:  02/03/17 99/67  01/30/17 122/84  01/09/17 106/73    Wt Readings from Last 3 Encounters:  02/03/17 157 lb (71.2 kg)  01/30/17 156 lb (70.8 kg)  01/09/17 164 lb (74.4 kg)     Physical Exam  Constitutional: She is oriented to person, place, and time. She appears well-developed and well-nourished. No distress.  HENT:  Head: Normocephalic and  atraumatic.  Right Ear: External ear normal.  Left Ear: External ear normal.  Nose: Nose normal.  Mouth/Throat: Oropharynx is clear and moist.  Eyes: Conjunctivae and EOM are normal. Pupils are equal, round, and reactive to light.  Neck: Normal range of motion. Neck supple. No thyromegaly present.  Cardiovascular: Normal rate, regular rhythm and normal heart sounds.  No murmur heard. Pulmonary/Chest: Effort normal and breath sounds normal. No respiratory distress. She has no wheezes. She has  no rales.  Abdominal: Soft. Bowel sounds are normal. She exhibits no distension. There is no tenderness.  Lymphadenopathy:    She has no cervical adenopathy.  Neurological: She is alert and oriented to person, place, and time. She has normal reflexes.  Skin: Skin is warm and dry.  Psychiatric: She has a normal mood and affect. Her behavior is normal. Judgment and thought content normal.      Assessment & Plan:   Altie was seen today for follow-up.  Diagnoses and all orders for this visit:  Multifocal pneumonia -     CMP14+EGFR  Recurrent acute serous otitis media of left ear  Nephrolithiasis  Moderate major depression (HCC)  Other orders -     amoxicillin-clavulanate (AUGMENTIN) 875-125 MG tablet; Take 1 tablet by mouth 2 (two) times daily. -     gabapentin (NEURONTIN) 600 MG tablet; Take 1 tablet (600 mg total) by mouth 3 (three) times daily. -     pramipexole (MIRAPEX) 0.5 MG tablet; Take 1 tablet (0.5 mg total) by mouth 2 (two) times daily.       I have changed Starlina A. Lasota's gabapentin. I am also having her start on amoxicillin-clavulanate. Additionally, I am having her maintain her levETIRAcetam, lamoTRIgine, FLUoxetine HCl, clonazePAM, oxyCODONE, and pramipexole.  Allergies as of 02/03/2017      Reactions   Acetaminophen Other (See Comments)   Has fatty deposits on liver   Benadryl [diphenhydramine Hcl] Other (See Comments)   hyperactivity and seizures   Codeine Itching, Rash   seizures   Dilantin [phenytoin Sodium Extended] Other (See Comments)   Elevated LFT's   Melatonin Other (See Comments)   seizures   Ultram [tramadol Hcl] Other (See Comments)   Seizures   Vimpat [lacosamide] Other (See Comments)   Severe dizziness   Betadine [povidone Iodine] Rash   Latex Rash   Penicillins Itching, Rash   Has patient had a PCN reaction causing immediate rash, facial/tongue/throat swelling, SOB or lightheadedness with hypotension: No Has patient had a PCN  reaction causing severe rash involving mucus membranes or skin necrosis: No Has patient had a PCN reaction that required hospitalization No Has patient had a PCN reaction occurring within the last 10 years: No If all of the above answers are "NO", then may proceed with Cephalosporin use.   Sulfa Antibiotics Rash   Tape Rash   Paper tape please   Vicodin [hydrocodone-acetaminophen] Itching      Medication List        Accurate as of 02/03/17  6:56 PM. Always use your most recent med list.          amoxicillin-clavulanate 875-125 MG tablet Commonly known as:  AUGMENTIN Take 1 tablet by mouth 2 (two) times daily.   clonazePAM 0.5 MG tablet Commonly known as:  KLONOPIN Take 1 tablet (0.5 mg total) by mouth daily as needed for anxiety.   FLUoxetine HCl 60 MG Tabs Take 60 mg by mouth daily.   gabapentin 600 MG tablet Commonly known as:  NEURONTIN Take  1 tablet (600 mg total) by mouth 3 (three) times daily.   lamoTRIgine 100 MG tablet Commonly known as:  LAMICTAL Take 1 tablet (100 mg total) by mouth 2 (two) times daily.   levETIRAcetam 500 MG tablet Commonly known as:  KEPPRA Take 3 tablets (1,500 mg total) by mouth 2 (two) times daily.   oxyCODONE 5 MG immediate release tablet Commonly known as:  Oxy IR/ROXICODONE Take 1 tablet (5 mg total) by mouth every 4 (four) hours as needed for severe pain.   pramipexole 0.5 MG tablet Commonly known as:  MIRAPEX Take 1 tablet (0.5 mg total) by mouth 2 (two) times daily.        Follow-up: Return in about 3 months (around 05/04/2017).  [name], M.D.

## 2017-02-04 ENCOUNTER — Encounter (HOSPITAL_COMMUNITY): Payer: Self-pay | Admitting: Psychiatry

## 2017-02-04 ENCOUNTER — Ambulatory Visit (INDEPENDENT_AMBULATORY_CARE_PROVIDER_SITE_OTHER): Payer: PPO | Admitting: Psychiatry

## 2017-02-04 ENCOUNTER — Other Ambulatory Visit: Payer: Self-pay | Admitting: Family Medicine

## 2017-02-04 ENCOUNTER — Telehealth: Payer: Self-pay | Admitting: Family Medicine

## 2017-02-04 VITALS — BP 112/75 | HR 68 | Ht 60.0 in | Wt 156.0 lb

## 2017-02-04 DIAGNOSIS — G4700 Insomnia, unspecified: Secondary | ICD-10-CM

## 2017-02-04 DIAGNOSIS — R413 Other amnesia: Secondary | ICD-10-CM

## 2017-02-04 DIAGNOSIS — R450 Nervousness: Secondary | ICD-10-CM

## 2017-02-04 DIAGNOSIS — F419 Anxiety disorder, unspecified: Secondary | ICD-10-CM | POA: Diagnosis not present

## 2017-02-04 DIAGNOSIS — Z560 Unemployment, unspecified: Secondary | ICD-10-CM

## 2017-02-04 DIAGNOSIS — J189 Pneumonia, unspecified organism: Secondary | ICD-10-CM | POA: Diagnosis not present

## 2017-02-04 DIAGNOSIS — G3184 Mild cognitive impairment, so stated: Secondary | ICD-10-CM | POA: Diagnosis not present

## 2017-02-04 DIAGNOSIS — R569 Unspecified convulsions: Secondary | ICD-10-CM

## 2017-02-04 DIAGNOSIS — Z813 Family history of other psychoactive substance abuse and dependence: Secondary | ICD-10-CM | POA: Diagnosis not present

## 2017-02-04 DIAGNOSIS — N200 Calculus of kidney: Secondary | ICD-10-CM

## 2017-02-04 DIAGNOSIS — Z818 Family history of other mental and behavioral disorders: Secondary | ICD-10-CM | POA: Diagnosis not present

## 2017-02-04 DIAGNOSIS — F331 Major depressive disorder, recurrent, moderate: Secondary | ICD-10-CM

## 2017-02-04 LAB — CMP14+EGFR
ALT: 29 IU/L (ref 0–32)
AST: 21 IU/L (ref 0–40)
Albumin/Globulin Ratio: 1.8 (ref 1.2–2.2)
Albumin: 4.4 g/dL (ref 3.5–5.5)
Alkaline Phosphatase: 64 IU/L (ref 39–117)
BUN/Creatinine Ratio: 15 (ref 9–23)
BUN: 10 mg/dL (ref 6–24)
Bilirubin Total: 0.4 mg/dL (ref 0.0–1.2)
CO2: 26 mmol/L (ref 20–29)
Calcium: 9.3 mg/dL (ref 8.7–10.2)
Chloride: 102 mmol/L (ref 96–106)
Creatinine, Ser: 0.66 mg/dL (ref 0.57–1.00)
GFR calc Af Amer: 118 mL/min/{1.73_m2} (ref >59)
GFR calc non Af Amer: 103 mL/min/{1.73_m2} (ref >59)
Globulin, Total: 2.4 g/dL (ref 1.5–4.5)
Glucose: 88 mg/dL (ref 65–99)
Potassium: 3.8 mmol/L (ref 3.5–5.2)
Sodium: 142 mmol/L (ref 134–144)
Total Protein: 6.8 g/dL (ref 6.0–8.5)

## 2017-02-04 MED ORDER — MIRTAZAPINE 7.5 MG PO TABS: 7.5000 mg | ORAL_TABLET | Freq: Every day | ORAL | 0 refills | Status: DC

## 2017-02-04 MED ORDER — OXYCODONE HCL 5 MG PO TABS: 5.0000 mg | ORAL_TABLET | ORAL | 0 refills | Status: DC | PRN

## 2017-02-04 MED ORDER — FLUOXETINE HCL 60 MG PO TABS: 60.0000 mg | ORAL_TABLET | Freq: Every day | ORAL | 0 refills | Status: DC

## 2017-02-04 MED ORDER — CLONAZEPAM 0.5 MG PO TABS: 0.5000 mg | ORAL_TABLET | Freq: Every day | ORAL | 2 refills | Status: DC | PRN

## 2017-02-04 NOTE — Patient Instructions (Addendum)
1. Continue fluoxetine 60 mg daily  2. Restart mirtazapine 15 mg at night 3. Continue clonazepam 0.5 mg daily as needed for anxiety  4. Return to clinic in three months for 15 mins

## 2017-02-04 NOTE — Telephone Encounter (Signed)
Patients last pain med refill was 01-30-17 for #12. Please advise on refill

## 2017-02-04 NOTE — Telephone Encounter (Signed)
Patient notified that rx sent to pharmacy 

## 2017-02-04 NOTE — Telephone Encounter (Signed)
Since the prescription was just for 12 pills in the and for a kidney stone I went ahead and sent a refill to [facility]. Thanks, WS

## 2017-02-05 DIAGNOSIS — N200 Calculus of kidney: Secondary | ICD-10-CM | POA: Diagnosis not present

## 2017-02-07 ENCOUNTER — Other Ambulatory Visit: Payer: Self-pay | Admitting: Family Medicine

## 2017-02-09 NOTE — Telephone Encounter (Signed)
Last seen 02/03/17  Dr Stacks 

## 2017-02-11 ENCOUNTER — Encounter (HOSPITAL_COMMUNITY): Payer: Self-pay | Admitting: Emergency Medicine

## 2017-02-11 ENCOUNTER — Emergency Department (HOSPITAL_COMMUNITY)
Admission: EM | Admit: 2017-02-11 | Discharge: 2017-02-11 | Disposition: A | Discharge status: Home / Self Care | Payer: PPO | Attending: Emergency Medicine | Admitting: Emergency Medicine

## 2017-02-11 DIAGNOSIS — Z96653 Presence of artificial knee joint, bilateral: Secondary | ICD-10-CM | POA: Diagnosis not present

## 2017-02-11 DIAGNOSIS — R5601 Complex febrile convulsions: Secondary | ICD-10-CM | POA: Diagnosis not present

## 2017-02-11 DIAGNOSIS — Z96642 Presence of left artificial hip joint: Secondary | ICD-10-CM | POA: Insufficient documentation

## 2017-02-11 DIAGNOSIS — Z79899 Other long term (current) drug therapy: Secondary | ICD-10-CM | POA: Diagnosis not present

## 2017-02-11 DIAGNOSIS — G40909 Epilepsy, unspecified, not intractable, without status epilepticus: Secondary | ICD-10-CM | POA: Diagnosis not present

## 2017-02-11 DIAGNOSIS — R569 Unspecified convulsions: Secondary | ICD-10-CM | POA: Diagnosis not present

## 2017-02-11 DIAGNOSIS — Z91040 Latex allergy status: Secondary | ICD-10-CM | POA: Diagnosis not present

## 2017-02-11 LAB — I-STAT CHEM 8, ED
BUN: 8 mg/dL (ref 6–20)
Calcium, Ion: 1.11 mmol/L — ABNORMAL LOW (ref 1.15–1.40)
Chloride: 105 mmol/L (ref 101–111)
Creatinine, Ser: 0.6 mg/dL (ref 0.44–1.00)
Glucose, Bld: 97 mg/dL (ref 65–99)
HCT: 37 % (ref 36.0–46.0)
Hemoglobin: 12.6 g/dL (ref 12.0–15.0)
Potassium: 3.7 mmol/L (ref 3.5–5.1)
Sodium: 144 mmol/L (ref 135–145)
TCO2: 27 mmol/L (ref 22–32)

## 2017-02-11 LAB — CBG MONITORING, ED: Glucose-Capillary: 108 mg/dL — ABNORMAL HIGH (ref 65–99)

## 2017-02-11 LAB — RAPID URINE DRUG SCREEN, HOSP PERFORMED
Amphetamines: NOT DETECTED
Barbiturates: NOT DETECTED
Benzodiazepines: NOT DETECTED
Cocaine: NOT DETECTED
Opiates: NOT DETECTED
Tetrahydrocannabinol: NOT DETECTED

## 2017-02-11 LAB — CBC WITH DIFFERENTIAL/PLATELET
Basophils Absolute: 0 10*3/uL (ref 0.0–0.1)
Basophils Relative: 0 %
Eosinophils Absolute: 0 10*3/uL (ref 0.0–0.7)
Eosinophils Relative: 0 %
HCT: 37.7 % (ref 36.0–46.0)
Hemoglobin: 12.4 g/dL (ref 12.0–15.0)
Lymphocytes Relative: 36 %
Lymphs Abs: 2.2 10*3/uL (ref 0.7–4.0)
MCH: 29 pg (ref 26.0–34.0)
MCHC: 32.9 g/dL (ref 30.0–36.0)
MCV: 88.3 fL (ref 78.0–100.0)
Monocytes Absolute: 0.4 10*3/uL (ref 0.1–1.0)
Monocytes Relative: 7 %
Neutro Abs: 3.5 10*3/uL (ref 1.7–7.7)
Neutrophils Relative %: 57 %
Platelets: 245 10*3/uL (ref 150–400)
RBC: 4.27 MIL/uL (ref 3.87–5.11)
RDW: 13.8 % (ref 11.5–15.5)
WBC: 6.1 10*3/uL (ref 4.0–10.5)

## 2017-02-11 LAB — URINALYSIS, ROUTINE W REFLEX MICROSCOPIC
Bilirubin Urine: NEGATIVE
Glucose, UA: NEGATIVE mg/dL
Hgb urine dipstick: NEGATIVE
Ketones, ur: NEGATIVE mg/dL
Leukocytes, UA: NEGATIVE
Nitrite: NEGATIVE
Protein, ur: NEGATIVE mg/dL
Specific Gravity, Urine: 1.017 (ref 1.005–1.030)
pH: 8 (ref 5.0–8.0)

## 2017-02-11 LAB — COMPREHENSIVE METABOLIC PANEL
ALT: 27 U/L (ref 14–54)
AST: 31 U/L (ref 15–41)
Albumin: 4.2 g/dL (ref 3.5–5.0)
Alkaline Phosphatase: 56 U/L (ref 38–126)
Anion gap: 7 (ref 5–15)
BUN: 9 mg/dL (ref 6–20)
CO2: 28 mmol/L (ref 22–32)
Calcium: 9.4 mg/dL (ref 8.9–10.3)
Chloride: 108 mmol/L (ref 101–111)
Creatinine, Ser: 0.65 mg/dL (ref 0.44–1.00)
GFR calc Af Amer: >60 mL/min (ref >60)
GFR calc non Af Amer: >60 mL/min (ref >60)
Glucose, Bld: 98 mg/dL (ref 65–99)
Potassium: 3.8 mmol/L (ref 3.5–5.1)
Sodium: 143 mmol/L (ref 135–145)
Total Bilirubin: 0.5 mg/dL (ref 0.3–1.2)
Total Protein: 7.1 g/dL (ref 6.5–8.1)

## 2017-02-11 LAB — ETHANOL: Alcohol, Ethyl (B): <10 mg/dL (ref <10)

## 2017-02-11 LAB — MAGNESIUM: Magnesium: 2.2 mg/dL (ref 1.7–2.4)

## 2017-02-11 MED ORDER — LORAZEPAM 2 MG/ML IJ SOLN
1.0000 mg | Freq: Once | INTRAMUSCULAR | Status: AC
  Administered 2017-02-11: 1 mg via INTRAVENOUS
  Filled 2017-02-11: qty 1

## 2017-02-11 MED ORDER — LAMOTRIGINE 150 MG PO TABS: 150.0000 mg | ORAL_TABLET | Freq: Two times a day (BID) | ORAL | 0 refills | Status: DC

## 2017-02-11 MED ORDER — LEVETIRACETAM 500 MG PO TABS
1500.0000 mg | ORAL_TABLET | Freq: Once | ORAL | Status: AC
  Administered 2017-02-11: 1500 mg via ORAL
  Filled 2017-02-11: qty 3

## 2017-02-11 MED ORDER — GABAPENTIN 300 MG PO CAPS
600.0000 mg | ORAL_CAPSULE | Freq: Once | ORAL | Status: AC
  Administered 2017-02-11: 600 mg via ORAL
  Filled 2017-02-11: qty 2

## 2017-02-11 MED ORDER — SODIUM CHLORIDE 0.9 % IV SOLN: 200.0000 mg | Freq: Once | INTRAVENOUS | Status: DC

## 2017-02-11 MED ORDER — LAMOTRIGINE 100 MG PO TABS
150.0000 mg | ORAL_TABLET | Freq: Once | ORAL | Status: AC
  Administered 2017-02-11: 150 mg via ORAL
  Filled 2017-02-11: qty 2

## 2017-02-11 MED ORDER — VALPROATE SODIUM 500 MG/5ML IV SOLN
1500.0000 mg | Freq: Once | INTRAVENOUS | Status: AC
  Administered 2017-02-11: 1500 mg via INTRAVENOUS
  Filled 2017-02-11: qty 15

## 2017-02-11 NOTE — ED Notes (Signed)
Pt given food and PO fluids with PA approval

## 2017-02-11 NOTE — ED Notes (Signed)
Bed: WA02 Expected date:  Expected time:  Means of arrival:  Comments: EMS shob 

## 2017-02-11 NOTE — ED Provider Notes (Signed)
[place] DEPT Provider Note   CSN: [id number] Arrival date & time: 02/11/17  1312     History   Chief Complaint No chief complaint on file.   HPI [name] is a 52 y.o. female.  HPI   Patient is a 52 year old female with a history of epilepsy (on Keppra and Lamictal), neurocognitive disorder, depression, nephrolithiasis, restless leg syndrome presenting for recurrent seizures prior to arrival.  Patient presents per [facility].  Per patient's report, she does not recall what occurred today other than being transported and EMS and arriving here.  Patient reports that prior to this event today, she was in her usual state of health.  Patient does report that last week she started an antibiotic for otitis media, and has been on an antibiotic for a respiratory tract infection.  Patient is unsure what antibiotic she is benign.  Patient also notes that she recently started a medication for her kidney stones.  Patient denies any fevers, head pain, chest pain, shortness of breath, nausea, vomiting, diarrhea, abdominal pain, muscle pain or aches.  Patient reports that she is feeling more confused than her baseline.  Level 5 caveat altered mental status.  Collateral information obtained from patient's daughter-in-law, Shermaine Rivet, with permission from the patient.  Per patient's daughter-in-law, who was present with the patient today at the restaurant when the seizures occurred, patient had 3 back-to-back seizures without return to baseline.  Patient did not hit her head in the course of the seizures.  Patient did not receive any benzodiazepines over this course.  EMS was called after the first seizure, and lowered patient to the ground.  She did not see patient returned to her baseline before transfer to EMS.  Prior to these 3 seizures, patient was acting per her baseline.  Patient's daughter-in-law reports that the patient does have some confusion at baseline, and  occasions, which are handled per Ms. Bryson Ha.  Patient's daughter-in-law also reports that the patient lives alone with her husband, however the daughter-in-law and her son are living right behind them and provide assistance as needed with home care.  Past Medical History:  Diagnosis Date  . Arthritis    "knees" (04/22/2012)  . Bronchitis   . Chronic bronchitis ([place])    "yearly; when the weather changes" (04/22/2012)  . Chronic kidney disease    kidney stones  . Colon polyps    adenomatous and hyperplastic-  . Constipation   . Depression   . Eczema   . Epilepsy ([place])    "been having them right often here lately" (04/22/2012)  . Fatty liver   . GEXBMWUX(324.4)    "1/wk" (04/22/2012)  . High cholesterol   . History of kidney stones   . Osteoarthritis    [name] 04/22/2012  . Other convulsions 05/21/12   non-epileptic spells  . Rectal bleed    in toilet- bright red  . Restless leg   . Seizures ([place])   . Vertigo     Patient Active Problem List   Diagnosis Date Noted  . Mild neurocognitive disorder 02/04/2017  . Seizure ([place]) 11/14/2016  . CAP (community acquired pneumonia) 11/09/2016  . 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 Moderate episode of recurrent major depressive disorder ([place]) 12/25/2015  . Bereavement 12/25/2015  . HCAP (healthcare-associated pneumonia) 11/28/2015  . Multifocal pneumonia 11/28/2015  . Partial symptomatic epilepsy with complex partial seizures, not intractable, with status epilepticus ([place]) 07/24/2015  . Insomnia w/ sleep apnea 07/24/2015  . OA (osteoarthritis)  of knee 05/07/2015  . Memory loss 02/20/2014  . Hypokalemia 06/23/2011  . Moderate major depression ([place]) 10/14/2010    Past Surgical History:  Procedure Laterality Date  . ABDOMINAL HYSTERECTOMY  2001  . BLADDER SUSPENSION    . BUNIONECTOMY Left 2000  . CESAREAN SECTION  1987; 1988  . COLONOSCOPY    . JOINT REPLACEMENT    . MASS EXCISION  10/22/2011   Procedure: EXCISION MASS;   Surgeon: [name], MD;  Location: [facility];  Service: General;  Laterality: Right;  excision right buttock mass  . TOTAL HIP ARTHROPLASTY Left 1993; 1995; 2000  . TOTAL KNEE ARTHROPLASTY Left 05/07/2015   Procedure: TOTAL LEFT KNEE ARTHROPLASTY;  Surgeon: [name], MD;  Location: [facility];  Service: Orthopedics;  Laterality: Left;  . TOTAL KNEE ARTHROPLASTY Right 10/01/2015   Procedure: RIGHT TOTAL KNEE ARTHROPLASTY;  Surgeon: [name], MD;  Location: [facility];  Service: Orthopedics;  Laterality: Right;    OB History    No data available       Home Medications    Prior to Admission medications   Medication Sig Start Date End Date Taking? Authorizing Provider  amoxicillin-clavulanate (AUGMENTIN) 875-125 MG tablet Take 1 tablet by mouth 2 (two) times daily. 02/03/17   [name], MD  clonazePAM (KLONOPIN) 0.5 MG tablet TAKE 1 TABLET BY MOUTH ONCE DAILY AS NEEDED 02/09/17   [name], MD  FLUoxetine HCl 60 MG TABS Take 60 mg by mouth daily. 02/04/17   Norman Clay, MD  gabapentin (NEURONTIN) 600 MG tablet Take 1 tablet (600 mg total) by mouth 3 (three) times daily. 02/03/17   [name], MD  lamoTRIgine (LAMICTAL) 100 MG tablet Take 1 tablet (100 mg total) by mouth 2 (two) times daily. 04/09/16   Dohmeier, Asencion Partridge, MD  levETIRAcetam (KEPPRA) 500 MG tablet Take 3 tablets (1,500 mg total) by mouth 2 (two) times daily. Patient taking differently: Take 500 mg by mouth 2 (two) times daily.  04/09/16   Dohmeier, Asencion Partridge, MD  mirtazapine (REMERON) 7.5 MG tablet Take 1 tablet (7.5 mg total) by mouth at bedtime. 02/04/17   Norman Clay, MD  oxyCODONE (OXY IR/ROXICODONE) 5 MG immediate release tablet Take 1 tablet (5 mg total) by mouth every 4 (four) hours as needed for severe pain. 02/04/17   [name], MD  pramipexole (MIRAPEX) 0.5 MG tablet Take 1 tablet (0.5 mg total) by mouth 2 (two) times daily. 02/03/17   [name], MD    Family History Family History  Problem  Relation Age of Onset  . Cancer Mother   . Depression Father   . Cancer Brother   . Diabetes Brother   . Cancer Maternal Aunt   . Diabetes Maternal Aunt   . Bipolar disorder Son   . Drug abuse Son     Social History Social History   Tobacco Use  . Smoking status: Never Smoker  . Smokeless tobacco: Never Used  Substance Use Topics  . Alcohol use: No    Alcohol/week: 0.0 oz    Comment: 12-25-2015 per pt no  . Drug use: No    Comment: 21-12-2015 per pt no      Allergies   Acetaminophen; Benadryl [diphenhydramine hcl]; Codeine; Dilantin [phenytoin sodium extended]; Melatonin; Ultram [tramadol hcl]; Vimpat [lacosamide]; Betadine [povidone iodine]; Latex; Penicillins; Sulfa antibiotics; Tape; and Vicodin [hydrocodone-acetaminophen]   Review of Systems Review of Systems  Constitutional: Negative for chills and fever.  HENT: Negative for congestion and rhinorrhea.   Eyes:  Negative for visual disturbance.  Respiratory: Negative for cough and shortness of breath.   Cardiovascular: Negative for chest pain.  Gastrointestinal: Negative for abdominal pain, nausea and vomiting.  Genitourinary: Negative for dysuria and flank pain.  Musculoskeletal: Negative for myalgias.  Skin: Negative for rash.  Neurological: Positive for seizures. Negative for speech difficulty, weakness and numbness.  All other systems reviewed and are negative.    Physical Exam Updated Vital Signs BP 101/72 (BP Location: Right Arm)   Pulse 66   Temp 98.5 F (36.9 C) (Oral)   Resp 17   SpO2 100%   Physical Exam  Constitutional: She appears well-developed and well-nourished. No distress.  HENT:  Head: Normocephalic and atraumatic.  Mouth/Throat: Oropharynx is clear and moist.  Eyes: Conjunctivae and EOM are normal. Pupils are equal, round, and reactive to light.  Neck: Normal range of motion. Neck supple.  Cardiovascular: Normal rate, regular rhythm, S1 normal and S2 normal.  No murmur  heard. Pulmonary/Chest: Effort normal and breath sounds normal. She has no wheezes. She has no rales.  Abdominal: Soft. She exhibits no distension. There is no tenderness. There is no guarding.  Musculoskeletal: Normal range of motion. She exhibits no edema or deformity.  Lymphadenopathy:    She has no cervical adenopathy.  Neurological: She is alert.  Mental Status:  Somnolent but easily arousable. Thought content appropriate, able to give a coherent history up until the seizures today. Speech fluent without evidence of aphasia. Able to follow 2 step commands without difficulty.  Cranial Nerves:  II:  Peripheral visual fields grossly normal, pupils equal, round, reactive to light III,IV, VI: ptosis not present, extra-ocular motions intact bilaterally  V,VII: smile symmetric, facial light touch sensation equal VIII: hearing grossly normal to voice  X: uvula elevates symmetrically  XI: bilateral shoulder shrug symmetric and strong XII: midline tongue extension without fassiculations Motor:  Normal tone. 5/5 in upper and lower extremities bilaterally including strong and equal grip strength and dorsiflexion/plantar flexion Sensory: Pinprick and light touch normal in all extremities.  Deep Tendon Reflexes: 2+ and symmetric in the biceps and patella.  Cerebellar: normal finger-to-nose with bilateral upper extremities Gait: normal gait and balance Stance: No pronator drift and good coordination, strength, and position sense with tapping of bilateral arms (performed in sitting position). CV: distal pulses palpable throughout   Skin: Skin is warm and dry. No rash noted. No erythema.  Psychiatric: She has a normal mood and affect. Her behavior is normal. Judgment and thought content normal.  Nursing note and vitals reviewed.    ED Treatments / Results  Labs (all labs ordered are listed, but only abnormal results are displayed) Labs Reviewed  CBG MONITORING, ED - Abnormal; Notable for the  following components:      Result Value   Glucose-Capillary 108 (*)    All other components within normal limits  I-STAT CHEM 8, ED - Abnormal; Notable for the following components:   Calcium, Ion 1.11 (*)    All other components within normal limits  CBC WITH DIFFERENTIAL/PLATELET  COMPREHENSIVE METABOLIC PANEL  URINALYSIS, ROUTINE W REFLEX MICROSCOPIC  ETHANOL  RAPID URINE DRUG SCREEN, HOSP PERFORMED    EKG  EKG Interpretation  Date/Time:  Wednesday February 11 2017 13:55:24 EST Ventricular Rate:  73 PR Interval:    QRS Duration: 99 QT Interval:  405 QTC Calculation: 447 R Axis:   67 Text Interpretation:  Sinus rhythm Consider left atrial enlargement Borderline repolarization abnormality No significant change since last tracing Confirmed  by Addison Lank [phone]) on 02/11/2017 2:31:50 PM       Radiology No results found.  Procedures Procedures (including critical care time)  Medications Ordered in ED Medications - No data to display   Initial Impression / Assessment and Plan / ED Course  I have reviewed the triage vital signs and the nursing notes.  Pertinent labs & imaging results that were available during my care of the patient were reviewed by me and considered in my medical decision making (see chart for details).  Clinical Course as of Feb 11 1802  Wed Feb 11, 2017  1652 Patient reevaluated.  Patient is back to her neurologic baseline per her report.  Patient is mentating well.  [AM]    Clinical Course User Index [AM] Albesa Seen, PA-C    Final Clinical Impressions(s) / ED Diagnoses   Final diagnoses:  Increasing frequency of seizure activity ([place])   Patient is nontoxic-appearing, afebrile, and in no acute distress.  There is not appear to be a clear antecedent as to why patient had a lowering of the seizure threshold today.  Patient did not missed doses of her antiepileptics.  No further seizure activity observed since arrival to the emergency  department.  Will obtain CBC, CMP, ethanol, and EKG.  Plan is provided the patient returns to neurologic baseline, patient is stable for discharge with family.  Will verify with family that she is at neurologic baseline prior to discharge. Patient independently evaluated by Dr. Addison Lank.  1730.  I was called to the bedside by RN after family reported seizure-like activity that lasted less than a minute.  On my evaluation, patient appeared to be postictal, however this quickly resolved and patient was speaking coherently.  Patient given 1 mg of Ativan in case discussed with Dr. Delora Fuel.  Further chart review performed with family at bedside, and verify that patient is taking her antiepileptic exactly as prescribed.  Will attempt to reach [facility] on call.  Patient case was discussed with Dr. Lorraine Lax, neurologist on call, who recommended valproic acid loading dose.  Dr. Lorraine Lax placed this order.  Subsequently, Dr. Cheral Marker of neurology performed chart review and provided additional recommendations.  Per Dr. Cheral Marker, recommendation to increase Lamictal dose to 150 mg twice daily and follow-up with her neurologist.  This prescription was provided.  During emergency department evaluation, no precipitant of lowering seizure threshold identified.  Does not appear to be infectious antecedent.  Electrolytes within normal limits, including magnesium.  No evidence of urinary tract infection.  No evidence of drug or alcohol use.  Per discussion about patient with Dr. Delora Fuel, patient deemed stable for discharge with the medication adjustments suggested by neurology.  Will send in basket message to Dr. Brett Fairy regarding adjustments and encouraged patient to follow-up early next week.  This is a shared visit with Dr. Addison Lank. Patient was independently evaluated by this attending physician. Attending physician consulted in evaluation and discharge management.   ED Discharge Orders     None       Tamala Julian 02/12/17 [id number]    Fatima Blank, MD 02/14/17 [phone]

## 2017-02-11 NOTE — ED Triage Notes (Signed)
Pt comes from restaurant via EMS after having 3 witnessed seizures with family.  No fall, as family slowly dropped patient to ground. Hx of same and takes medication as prescribed regularly. Vitals stable in route.  Post ictal. Seizure time approximately 1 minute each time.  Last seizure was in November.  CBG 148. Pt family had another doctor appointment to go to and will be here later.

## 2017-02-11 NOTE — Discharge Instructions (Signed)
Please see the information and instructions below regarding your visit.  Your diagnoses today include:  1. Increasing frequency of seizure activity ([place])     Tests performed today include: See side panel of your discharge paperwork for testing performed today. Vital signs are listed at the bottom of these instructions.   Medications prescribed:    Take any prescribed medications only as prescribed, and any over the counter medications only as directed on the packaging.  Per our on-call neurologist, Dr. Cheral Marker, he recommended that the dose of Lamictal be increased to 150 mg twice daily from 100 mg twice daily.  I provided a month supply of this.  I would like you to see Dr. Brett Fairy soon as possible.  Home care instructions:  Please follow any educational materials contained in this packet.   Follow-up instructions: Please follow-up with your primary care provider as soon as possible for further evaluation of your symptoms if they are not completely improved.   Please follow up with Dr. Brett Fairy.  Return instructions:  Please return to the Emergency Department if you experience worsening symptoms.  Please return to the emergency department if you have any seizures lasting longer than 5 minutes, seizures that are recurring without return to baseline in between, changes in mental status, weakness or numbness that is affecting one side of the body, difficulty speaking, difficult to breathing, difficulty swallowing. Please return if you have any other emergent concerns.  Additional Information:  Your vital signs today were: BP 117/81 (BP Location: Right Arm)    Pulse 65    Temp 98.5 F (36.9 C) (Oral)    Resp 20    SpO2 98%  If your blood pressure (BP) was elevated on multiple readings during this visit above 130 for the top number or above 80 for the bottom number, please have this repeated by your primary care provider within one month. --------------  Thank you for allowing [name] to  participate in your care today.

## 2017-02-19 DIAGNOSIS — N200 Calculus of kidney: Secondary | ICD-10-CM | POA: Diagnosis not present

## 2017-02-19 NOTE — Telephone Encounter (Signed)
Pt was seen by Dr Livia Snellen since this call, will close encounter.

## 2017-02-21 DIAGNOSIS — G44309 Post-traumatic headache, unspecified, not intractable: Secondary | ICD-10-CM | POA: Diagnosis not present

## 2017-02-21 DIAGNOSIS — Z79899 Other long term (current) drug therapy: Secondary | ICD-10-CM | POA: Diagnosis not present

## 2017-02-21 DIAGNOSIS — R5383 Other fatigue: Secondary | ICD-10-CM | POA: Diagnosis not present

## 2017-02-21 DIAGNOSIS — G40802 Other epilepsy, not intractable, without status epilepticus: Secondary | ICD-10-CM | POA: Diagnosis not present

## 2017-02-21 DIAGNOSIS — S098XXA Other specified injuries of head, initial encounter: Secondary | ICD-10-CM | POA: Diagnosis not present

## 2017-02-21 DIAGNOSIS — R569 Unspecified convulsions: Secondary | ICD-10-CM | POA: Diagnosis not present

## 2017-02-21 DIAGNOSIS — S060X0A Concussion without loss of consciousness, initial encounter: Secondary | ICD-10-CM | POA: Diagnosis not present

## 2017-02-21 DIAGNOSIS — S0990XA Unspecified injury of head, initial encounter: Secondary | ICD-10-CM | POA: Diagnosis not present

## 2017-02-21 DIAGNOSIS — R55 Syncope and collapse: Secondary | ICD-10-CM | POA: Diagnosis not present

## 2017-02-26 DIAGNOSIS — R404 Transient alteration of awareness: Secondary | ICD-10-CM | POA: Diagnosis not present

## 2017-02-26 DIAGNOSIS — Z888 Allergy status to other drugs, medicaments and biological substances status: Secondary | ICD-10-CM | POA: Diagnosis not present

## 2017-02-26 DIAGNOSIS — T424X1A Poisoning by benzodiazepines, accidental (unintentional), initial encounter: Secondary | ICD-10-CM | POA: Diagnosis not present

## 2017-02-26 DIAGNOSIS — Z880 Allergy status to penicillin: Secondary | ICD-10-CM | POA: Diagnosis not present

## 2017-02-26 DIAGNOSIS — E7800 Pure hypercholesterolemia, unspecified: Secondary | ICD-10-CM | POA: Diagnosis not present

## 2017-02-26 DIAGNOSIS — R0682 Tachypnea, not elsewhere classified: Secondary | ICD-10-CM | POA: Diagnosis not present

## 2017-02-26 DIAGNOSIS — T884XXA Failed or difficult intubation, initial encounter: Secondary | ICD-10-CM | POA: Diagnosis not present

## 2017-02-26 DIAGNOSIS — G40909 Epilepsy, unspecified, not intractable, without status epilepticus: Secondary | ICD-10-CM | POA: Diagnosis not present

## 2017-02-26 DIAGNOSIS — R0902 Hypoxemia: Secondary | ICD-10-CM | POA: Diagnosis not present

## 2017-02-26 DIAGNOSIS — J9601 Acute respiratory failure with hypoxia: Secondary | ICD-10-CM | POA: Diagnosis not present

## 2017-02-26 DIAGNOSIS — R4020 Unspecified coma: Secondary | ICD-10-CM | POA: Diagnosis not present

## 2017-02-26 DIAGNOSIS — R4182 Altered mental status, unspecified: Secondary | ICD-10-CM | POA: Diagnosis not present

## 2017-02-26 DIAGNOSIS — Z886 Allergy status to analgesic agent status: Secondary | ICD-10-CM | POA: Diagnosis not present

## 2017-02-26 DIAGNOSIS — F319 Bipolar disorder, unspecified: Secondary | ICD-10-CM | POA: Diagnosis not present

## 2017-02-26 DIAGNOSIS — R55 Syncope and collapse: Secondary | ICD-10-CM | POA: Diagnosis not present

## 2017-02-26 DIAGNOSIS — T402X1A Poisoning by other opioids, accidental (unintentional), initial encounter: Secondary | ICD-10-CM | POA: Diagnosis not present

## 2017-02-26 DIAGNOSIS — Z79899 Other long term (current) drug therapy: Secondary | ICD-10-CM | POA: Diagnosis not present

## 2017-03-02 ENCOUNTER — Other Ambulatory Visit: Payer: Self-pay | Admitting: Family Medicine

## 2017-03-02 ENCOUNTER — Telehealth: Payer: Self-pay

## 2017-03-02 DIAGNOSIS — R42 Dizziness and giddiness: Secondary | ICD-10-CM | POA: Diagnosis not present

## 2017-03-02 DIAGNOSIS — G40909 Epilepsy, unspecified, not intractable, without status epilepticus: Secondary | ICD-10-CM | POA: Diagnosis not present

## 2017-03-02 DIAGNOSIS — Z9181 History of falling: Secondary | ICD-10-CM | POA: Diagnosis not present

## 2017-03-02 DIAGNOSIS — Z96651 Presence of right artificial knee joint: Secondary | ICD-10-CM | POA: Diagnosis not present

## 2017-03-02 DIAGNOSIS — Z96642 Presence of left artificial hip joint: Secondary | ICD-10-CM | POA: Diagnosis not present

## 2017-03-02 DIAGNOSIS — E785 Hyperlipidemia, unspecified: Secondary | ICD-10-CM | POA: Diagnosis not present

## 2017-03-02 DIAGNOSIS — M1991 Primary osteoarthritis, unspecified site: Secondary | ICD-10-CM | POA: Diagnosis not present

## 2017-03-02 DIAGNOSIS — J42 Unspecified chronic bronchitis: Secondary | ICD-10-CM | POA: Diagnosis not present

## 2017-03-02 DIAGNOSIS — Z96652 Presence of left artificial knee joint: Secondary | ICD-10-CM | POA: Diagnosis not present

## 2017-03-02 DIAGNOSIS — N183 Chronic kidney disease, stage 3 (moderate): Secondary | ICD-10-CM | POA: Diagnosis not present

## 2017-03-02 MED ORDER — MUPIROCIN 2 % EX OINT: 1.0000 "application " | TOPICAL_OINTMENT | Freq: Two times a day (BID) | CUTANEOUS | 0 refills | Status: DC

## 2017-03-02 NOTE — Telephone Encounter (Signed)
Just got out of hospital on O2  Breaking out under nose  Is there something you can prescribe?  And wanting to change back to Ativan

## 2017-03-02 NOTE — Telephone Encounter (Signed)
Attempted to contact patient - line busy 

## 2017-03-02 NOTE — Telephone Encounter (Signed)
Please tell the patient that she would need to be seen to discuss the Ativan versus clonazepam prescriptions.  I sent in a prescription to [facility] for an ointment she can use under her nose

## 2017-03-03 ENCOUNTER — Other Ambulatory Visit: Payer: Self-pay

## 2017-03-03 ENCOUNTER — Encounter (HOSPITAL_COMMUNITY): Payer: Self-pay

## 2017-03-03 ENCOUNTER — Emergency Department (HOSPITAL_COMMUNITY): Payer: PPO

## 2017-03-03 ENCOUNTER — Inpatient Hospital Stay (HOSPITAL_COMMUNITY)
Admission: EM | Admit: 2017-03-03 | Discharge: 2017-03-05 | DRG: 101 | Disposition: A | Discharge status: Home Health | Payer: PPO | Attending: Internal Medicine | Admitting: Internal Medicine

## 2017-03-03 DIAGNOSIS — Z90710 Acquired absence of both cervix and uterus: Secondary | ICD-10-CM | POA: Diagnosis not present

## 2017-03-03 DIAGNOSIS — J42 Unspecified chronic bronchitis: Secondary | ICD-10-CM | POA: Diagnosis present

## 2017-03-03 DIAGNOSIS — Y92512 Supermarket, store or market as the place of occurrence of the external cause: Secondary | ICD-10-CM | POA: Diagnosis not present

## 2017-03-03 DIAGNOSIS — Z96642 Presence of left artificial hip joint: Secondary | ICD-10-CM | POA: Diagnosis not present

## 2017-03-03 DIAGNOSIS — R569 Unspecified convulsions: Secondary | ICD-10-CM | POA: Diagnosis not present

## 2017-03-03 DIAGNOSIS — M25561 Pain in right knee: Secondary | ICD-10-CM | POA: Diagnosis not present

## 2017-03-03 DIAGNOSIS — Z87442 Personal history of urinary calculi: Secondary | ICD-10-CM | POA: Diagnosis not present

## 2017-03-03 DIAGNOSIS — W19XXXA Unspecified fall, initial encounter: Secondary | ICD-10-CM | POA: Diagnosis present

## 2017-03-03 DIAGNOSIS — Z888 Allergy status to other drugs, medicaments and biological substances status: Secondary | ICD-10-CM | POA: Diagnosis not present

## 2017-03-03 DIAGNOSIS — Z818 Family history of other mental and behavioral disorders: Secondary | ICD-10-CM | POA: Diagnosis not present

## 2017-03-03 DIAGNOSIS — S0990XA Unspecified injury of head, initial encounter: Secondary | ICD-10-CM | POA: Diagnosis not present

## 2017-03-03 DIAGNOSIS — Z885 Allergy status to narcotic agent status: Secondary | ICD-10-CM

## 2017-03-03 DIAGNOSIS — F329 Major depressive disorder, single episode, unspecified: Secondary | ICD-10-CM | POA: Diagnosis not present

## 2017-03-03 DIAGNOSIS — Z886 Allergy status to analgesic agent status: Secondary | ICD-10-CM

## 2017-03-03 DIAGNOSIS — Z96653 Presence of artificial knee joint, bilateral: Secondary | ICD-10-CM | POA: Diagnosis not present

## 2017-03-03 DIAGNOSIS — Z882 Allergy status to sulfonamides status: Secondary | ICD-10-CM

## 2017-03-03 DIAGNOSIS — Z91048 Other nonmedicinal substance allergy status: Secondary | ICD-10-CM | POA: Diagnosis not present

## 2017-03-03 DIAGNOSIS — E785 Hyperlipidemia, unspecified: Secondary | ICD-10-CM | POA: Diagnosis not present

## 2017-03-03 DIAGNOSIS — G40909 Epilepsy, unspecified, not intractable, without status epilepticus: Secondary | ICD-10-CM | POA: Diagnosis not present

## 2017-03-03 DIAGNOSIS — Z880 Allergy status to penicillin: Secondary | ICD-10-CM | POA: Diagnosis not present

## 2017-03-03 DIAGNOSIS — M25511 Pain in right shoulder: Secondary | ICD-10-CM | POA: Diagnosis present

## 2017-03-03 DIAGNOSIS — Z91040 Latex allergy status: Secondary | ICD-10-CM

## 2017-03-03 DIAGNOSIS — M797 Fibromyalgia: Secondary | ICD-10-CM | POA: Diagnosis not present

## 2017-03-03 DIAGNOSIS — E876 Hypokalemia: Secondary | ICD-10-CM | POA: Diagnosis not present

## 2017-03-03 DIAGNOSIS — G40401 Other generalized epilepsy and epileptic syndromes, not intractable, with status epilepticus: Secondary | ICD-10-CM | POA: Diagnosis not present

## 2017-03-03 DIAGNOSIS — Z471 Aftercare following joint replacement surgery: Secondary | ICD-10-CM | POA: Diagnosis not present

## 2017-03-03 DIAGNOSIS — Z96651 Presence of right artificial knee joint: Secondary | ICD-10-CM | POA: Diagnosis not present

## 2017-03-03 DIAGNOSIS — K760 Fatty (change of) liver, not elsewhere classified: Secondary | ICD-10-CM | POA: Diagnosis not present

## 2017-03-03 LAB — COMPREHENSIVE METABOLIC PANEL
ALT: 45 U/L (ref 14–54)
AST: 63 U/L — ABNORMAL HIGH (ref 15–41)
Albumin: 4.3 g/dL (ref 3.5–5.0)
Alkaline Phosphatase: 64 U/L (ref 38–126)
Anion gap: 14 (ref 5–15)
BUN: 7 mg/dL (ref 6–20)
CO2: 21 mmol/L — ABNORMAL LOW (ref 22–32)
Calcium: 9.5 mg/dL (ref 8.9–10.3)
Chloride: 106 mmol/L (ref 101–111)
Creatinine, Ser: 0.76 mg/dL (ref 0.44–1.00)
GFR calc Af Amer: >60 mL/min (ref >60)
GFR calc non Af Amer: >60 mL/min (ref >60)
Glucose, Bld: 96 mg/dL (ref 65–99)
Potassium: 4.9 mmol/L (ref 3.5–5.1)
Sodium: 141 mmol/L (ref 135–145)
Total Bilirubin: 0.9 mg/dL (ref 0.3–1.2)
Total Protein: 7.4 g/dL (ref 6.5–8.1)

## 2017-03-03 LAB — CBC WITH DIFFERENTIAL/PLATELET
Basophils Absolute: 0 10*3/uL (ref 0.0–0.1)
Basophils Relative: 0 %
Eosinophils Absolute: 0 10*3/uL (ref 0.0–0.7)
Eosinophils Relative: 0 %
HCT: 40.9 % (ref 36.0–46.0)
Hemoglobin: 13.7 g/dL (ref 12.0–15.0)
Lymphocytes Relative: 24 %
Lymphs Abs: 1.8 10*3/uL (ref 0.7–4.0)
MCH: 29.7 pg (ref 26.0–34.0)
MCHC: 33.5 g/dL (ref 30.0–36.0)
MCV: 88.5 fL (ref 78.0–100.0)
Monocytes Absolute: 0.6 10*3/uL (ref 0.1–1.0)
Monocytes Relative: 8 %
Neutro Abs: 4.9 10*3/uL (ref 1.7–7.7)
Neutrophils Relative %: 68 %
Platelets: 294 10*3/uL (ref 150–400)
RBC: 4.62 MIL/uL (ref 3.87–5.11)
RDW: 13.7 % (ref 11.5–15.5)
Smear Review: ADEQUATE
WBC: 7.3 10*3/uL (ref 4.0–10.5)

## 2017-03-03 LAB — CBG MONITORING, ED: Glucose-Capillary: 90 mg/dL (ref 65–99)

## 2017-03-03 LAB — URINALYSIS, ROUTINE W REFLEX MICROSCOPIC
Bilirubin Urine: NEGATIVE
Glucose, UA: NEGATIVE mg/dL
Hgb urine dipstick: NEGATIVE
Ketones, ur: NEGATIVE mg/dL
Leukocytes, UA: NEGATIVE
Nitrite: NEGATIVE
Protein, ur: NEGATIVE mg/dL
Specific Gravity, Urine: 1.019 (ref 1.005–1.030)
pH: 7 (ref 5.0–8.0)

## 2017-03-03 MED ORDER — LORAZEPAM 2 MG/ML IJ SOLN: 2.0000 mg | Freq: Once | INTRAMUSCULAR | Status: DC

## 2017-03-03 MED ORDER — LEVETIRACETAM IN NACL 1000 MG/100ML IV SOLN
1000.0000 mg | Freq: Once | INTRAVENOUS | Status: DC
  Filled 2017-03-03: qty 100

## 2017-03-03 MED ORDER — LORAZEPAM 2 MG/ML IJ SOLN: 2.0000 mg | Freq: Four times a day (QID) | INTRAMUSCULAR | Status: DC | PRN

## 2017-03-03 MED ORDER — LORAZEPAM 2 MG/ML IJ SOLN
1.0000 mg | Freq: Once | INTRAMUSCULAR | Status: AC
  Administered 2017-03-03: 1 mg via INTRAVENOUS
  Filled 2017-03-03: qty 1

## 2017-03-03 MED ORDER — LEVETIRACETAM 500 MG/5ML IV SOLN
1000.0000 mg | INTRAVENOUS | Status: AC
  Administered 2017-03-03: 1000 mg via INTRAVENOUS
  Filled 2017-03-03: qty 10

## 2017-03-03 NOTE — ED Notes (Signed)
Patient transported to X-ray 

## 2017-03-03 NOTE — ED Provider Notes (Signed)
Emergency Department Provider Note   I have reviewed the triage vital signs and the nursing notes.   HISTORY  Chief Complaint Seizures   HPI CATHERIN DOORN is a 52 y.o. female with PMH of seizure currently on Lamictal and Keppra and followed by [facility] to the emergency department for evaluation of seizure.  The history is primarily provided by the patient's daughter at bedside.  She states that at 1130 today they were shopping when the patient suddenly had a seizure which lasted for 1-2 minutes.  At that time she fell forward striking her face on some display casing and other store items.  EMS arrived and evaluated the patient but did not transport.  She returned home where she is continued to have intermittent, brief seizures including one here in the emergency department waiting room.  She is returning to her mental status baseline between episodes.  The patient states she is been compliant with her Keppra and Lamictal.  The Lamictal was increased during an emergency department visit in January she has been compliant with this dose.  She denies drinking alcohol.  She has been compliant with her clonazepam and does not suddenly stopped taking his medication.   Level 5 caveat: Post-ictal.    Past Medical History:  Diagnosis Date  . Arthritis    "knees" (04/22/2012)  . Bronchitis   . Chronic bronchitis ([place])    "yearly; when the weather changes" (04/22/2012)  . Chronic kidney disease    kidney stones  . Colon polyps    adenomatous and hyperplastic-  . Constipation   . Depression   . Eczema   . Epilepsy ([place])    "been having them right often here lately" (04/22/2012)  . Fatty liver   . YSAYTKZS(010.9)    "1/wk" (04/22/2012)  . High cholesterol   . History of kidney stones   . Osteoarthritis    [name] 04/22/2012  . Other convulsions 05/21/12   non-epileptic spells  . Rectal bleed    in toilet- bright red  . Restless leg   . Seizures ([place])   . Vertigo      Patient Active Problem List   Diagnosis Date Noted  . Mild neurocognitive disorder 02/04/2017  . Seizure ([place]) 11/14/2016  . CAP (community acquired pneumonia) 11/09/2016  . 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 Moderate episode of recurrent major depressive disorder ([place]) 12/25/2015  . Bereavement 12/25/2015  . HCAP (healthcare-associated pneumonia) 11/28/2015  . Multifocal pneumonia 11/28/2015  . Partial symptomatic epilepsy with complex partial seizures, not intractable, with status epilepticus ([place]) 07/24/2015  . Insomnia w/ sleep apnea 07/24/2015  . OA (osteoarthritis) of knee 05/07/2015  . Memory loss 02/20/2014  . Hypokalemia 06/23/2011  . Moderate major depression ([place]) 10/14/2010    Past Surgical History:  Procedure Laterality Date  . ABDOMINAL HYSTERECTOMY  2001  . BLADDER SUSPENSION    . BUNIONECTOMY Left 2000  . CESAREAN SECTION  1987; 1988  . COLONOSCOPY    . JOINT REPLACEMENT    . MASS EXCISION  10/22/2011   Procedure: EXCISION MASS;  Surgeon: [name], MD;  Location: [facility];  Service: General;  Laterality: Right;  excision right buttock mass  . TOTAL HIP ARTHROPLASTY Left 1993; 1995; 2000  . TOTAL KNEE ARTHROPLASTY Left 05/07/2015   Procedure: TOTAL LEFT KNEE ARTHROPLASTY;  Surgeon: [name], MD;  Location: [facility];  Service: Orthopedics;  Laterality: Left;  . TOTAL KNEE ARTHROPLASTY Right 10/01/2015   Procedure: RIGHT TOTAL KNEE ARTHROPLASTY;  Surgeon: [name], MD;  Location: [facility];  Service: Orthopedics;  Laterality: Right;    Current Outpatient Rx  . Order #: [id number] Class: Normal  . Order #: [id number] Class: Normal  . Order #: [id number] Class: Historical Med  . Order #: [id number] Class: Normal  . Order #: [id number] Class: Normal  . Order #: [id number] Class: Print  . Order #: [id number] Class: Normal  . Order #: [id number] Class: Normal  . Order #: [id number] Class: Normal  . Order #: [id number] Class: Historical Med  . Order #: [id number] Class:  Normal  . Order #: [id number] Class: Normal    Allergies Acetaminophen; Benadryl [diphenhydramine hcl]; Codeine; Dilantin [phenytoin sodium extended]; Melatonin; Ultram [tramadol hcl]; Vimpat [lacosamide]; Betadine [povidone iodine]; Latex; Penicillins; Sulfa antibiotics; Tape; and Vicodin [hydrocodone-acetaminophen]  Family History  Problem Relation Age of Onset  . Cancer Mother   . Depression Father   . Cancer Brother   . Diabetes Brother   . Cancer Maternal Aunt   . Diabetes Maternal Aunt   . Bipolar disorder Son   . Drug abuse Son     Social History Social History   Tobacco Use  . Smoking status: Never Smoker  . Smokeless tobacco: Never Used  Substance Use Topics  . Alcohol use: No    Alcohol/week: 0.0 oz    Comment: 12-25-2015 per pt no  . Drug use: No    Comment: 21-12-2015 per pt no     Review of Systems  Constitutional: No fever/chills Eyes: No visual changes. ENT: No sore throat. Cardiovascular: Denies chest pain. Respiratory: Denies shortness of breath. Gastrointestinal: No abdominal pain. No nausea, no vomiting. No diarrhea.  No constipation. Genitourinary: Negative for dysuria. Musculoskeletal: Negative for back pain. Positive right shoulder and knee pain.  Skin: Negative for rash. Neurological: Negative for headaches, focal weakness or numbness.  10-point ROS otherwise negative.  ____________________________________________   PHYSICAL EXAM:  VITAL SIGNS: Vitals:   03/03/17 2015 03/03/17 2030  BP: 109/81 107/84  Pulse: 83 78  Resp:    SpO2: 94% 93%    Constitutional: Alert and oriented. Appears somewhat dazed but answering questions.  Eyes: Conjunctivae are normal. Head: Atraumatic. Nose: No congestion/rhinnorhea. Mouth/Throat: Mucous membranes are dry.  Neck: No stridor.  Cardiovascular: Normal rate, regular rhythm. Good peripheral circulation. Grossly normal heart sounds.   Respiratory: Normal respiratory effort.  No retractions. Lungs  CTAB. Gastrointestinal: Soft and nontender. No distention.  Musculoskeletal: No lower extremity tenderness nor edema. No gross deformities of extremities. Neurologic:  Normal speech and language. No gross focal neurologic deficits are appreciated.  Skin:  Skin is warm, dry and intact. No rash noted.  ____________________________________________   LABS (all labs ordered are listed, but only abnormal results are displayed)  Labs Reviewed  COMPREHENSIVE METABOLIC PANEL - Abnormal; Notable for the following components:      Result Value   CO2 21 (*)    AST 63 (*)    All other components within normal limits  CBC WITH DIFFERENTIAL/PLATELET  URINALYSIS, ROUTINE W REFLEX MICROSCOPIC  CBG MONITORING, ED   ____________________________________________  EKG   EKG Interpretation  Date/Time:  Tuesday March 03 2017 17:38:02 EST Ventricular Rate:  85 PR Interval:    QRS Duration: 97 QT Interval:  397 QTC Calculation: 473 R Axis:   69 Text Interpretation:  Sinus rhythm Borderline short PR interval No STEMI.  Confirmed by Nanda Quinton [phone]) on 03/03/2017 5:53:54 PM       ____________________________________________  RADIOLOGY  Dg Shoulder Right  Result Date: 03/03/2017 CLINICAL DATA:  Right shoulder  pain after seizure. EXAM: RIGHT SHOULDER - 2+ VIEW COMPARISON:  11/14/2014 FINDINGS: There is no evidence of fracture or dislocation. There is no evidence of arthropathy or other focal bone abnormality. Soft tissues are unremarkable. IMPRESSION: Negative for fracture or joint dislocations. Electronically Signed   By: Ashley Royalty M.D.   On: 03/03/2017 18:47   Dg Knee 2 Views Right  Result Date: 03/03/2017 CLINICAL DATA:  Right knee pain after seizure. EXAM: RIGHT KNEE - 1-2 VIEW COMPARISON:  None. 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 No hardware loosening or failure. No fracture. No joint effusion nor joint dislocations. Unremarkable soft tissues.  IMPRESSION: Intact right total knee arthroplasty.  No acute osseous abnormality. Electronically Signed   By: Ashley Royalty M.D.   On: 03/03/2017 18:48   Ct Head Wo Contrast  Result Date: 03/03/2017 CLINICAL DATA:  Status post fall with witnessed seizures today. EXAM: CT HEAD WITHOUT CONTRAST TECHNIQUE: Contiguous axial images were obtained from the base of the skull through the vertex without intravenous contrast. COMPARISON:  February 26, 2017 FINDINGS: Brain: No evidence of acute infarction, hemorrhage, hydrocephalus, extra-axial collection or mass lesion/mass effect. Vascular: No hyperdense vessel or unexpected calcification. Skull: Normal. Negative for fracture or focal lesion. Sinuses/Orbits: No acute finding. Other: None. IMPRESSION: No focal acute intracranial abnormality identified. Electronically Signed   By: Abelardo Diesel M.D.   On: 03/03/2017 19:03   Dg Chest Portable 1 View  Result Date: 03/03/2017 CLINICAL DATA:  Seizure.  No chest complaints. EXAM: PORTABLE CHEST 1 VIEW COMPARISON:  Chest x-ray dated February 27, 2017. FINDINGS: The heart size and mediastinal contours are within normal limits. Both lungs are clear. The visualized skeletal structures are unremarkable. IMPRESSION: No active disease. Electronically Signed   By: Titus Dubin M.D.   On: 03/03/2017 20:00    ____________________________________________   PROCEDURES  Procedure(s) performed:   .Critical Care Performed by: Margette Fast, MD Authorized by: Margette Fast, MD   Critical care provider statement:    Critical care time (minutes):  35   Critical care time was exclusive of:  Separately billable procedures and treating other patients and teaching time   Critical care was necessary to treat or prevent imminent or life-threatening deterioration of the following conditions:  CNS failure or compromise   Critical care was time spent personally by me on the following activities:  Blood draw for specimens,  development of treatment plan with patient or surrogate, discussions with consultants, evaluation of patient's response to treatment, examination of patient, obtaining history from patient or surrogate, ordering and performing treatments and interventions, ordering and review of laboratory studies, ordering and review of radiographic studies, pulse oximetry, re-evaluation of patient's condition and review of old charts   I assumed direction of critical care for this patient from another provider in my specialty: no      ____________________________________________   INITIAL IMPRESSION / [place] / ED COURSE  Pertinent labs & imaging results that were available during my care of the patient were reviewed by me and considered in my medical decision making (see chart for details).  Patient with history of epilepsy presents with multiple breakthrough seizures today.  She is been compliant with her Keppra and Lamictal.  Family state that they give her the medications to know that she is compliant.  She appears somewhat dazed at times but is answering my questions and able to tell me the month and year and that she is at [facility].  Family called me back into the room for seizure-like activity.  The patient is mumbling but answers to questioning.  Ordered Ativan as needed but did give during this episode.   07:10 PM Called to the bathroom of the patient appeared to be having a generalized tonic-clonic seizure.  She had eye deviation inferiorly.  Patient was stood up to get help off of the toilet and transferred to stretcher at which point the seizure stopped.  She made eye contact with me shortly after the event.  Labs and imaging reviewed with no acute abnormal labs.  Paging neurology to discuss further treatment/monitoring.  Patient has multiple listed allergies to many seizure medications.   Discussed case with Dr. Leonel Ramsay. 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   Discussed patient's case with Hospitalist, Dr. Posey Pronto to request admission. Patient and family (if present) updated with plan. Care transferred to Hospitalist service.  I reviewed all nursing notes, vitals, pertinent old records, EKGs, labs, imaging (as available).  ____________________________________________  FINAL CLINICAL IMPRESSION(S) / ED DIAGNOSES  Final diagnoses:  Seizures ([place])     MEDICATIONS GIVEN DURING THIS VISIT:  Medications  LORazepam (ATIVAN) injection 1 mg (1 mg Intravenous Given 03/03/17 1933)  levETIRAcetam (KEPPRA) 1,000 mg in sodium chloride 0.9 % 100 mL IVPB (0 mg Intravenous Stopped 03/03/17 2011)     Note:  This document was prepared using Dragon voice recognition software and may include unintentional dictation errors.  Nanda Quinton, MD Emergency Medicine    Esthefany Herrig, [facility], MD 03/03/17 2214

## 2017-03-03 NOTE — ED Notes (Signed)
Neurology at bedside.

## 2017-03-03 NOTE — Consult Note (Addendum)
Neurology Consultation Reason for Consult: Seizures Referring Physician: Long, J  CC: Seizures  History is obtained from: Patient, family  HPI: MARKEYA MINCY is a 52 y.o. female with a history of epilepsy since age 28.  Though she describes that this was her first generalized seizure, she does describe staring spells as well as intermittent jerking episodes that would cause her to drop things that happened prior to this age, even going back into her 23s.  She has had multiple abnormal EEGs with  generalized discharges and at least one admission in January 2017 for absence status epilepticus which manifested as isolated confusion.  She describes her seizures as multi-type, sometimes being a staring spell lasting for up to 2-3 minutes, sometimes being a very brief "jerk"without prolonged change in consciousness, sometimes being convulsive with the longest that her son is seen lasting about 45 seconds.  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  She was admitted [facility] in December, there was question of pseudoseizure and also some question of somnolence and therefore her Keppra was reduced from 1500 mg twice daily to 500 mg twice daily.  Since that time, she has had an increase in seizure frequency, with seizures occurring at least weekly.   ROS: A 14 point ROS was performed and is negative except as noted in the HPI.  Past Medical History:  Diagnosis Date  . Arthritis    "knees" (04/22/2012)  . Bronchitis   . Chronic bronchitis ([place])    "yearly; when the weather changes" (04/22/2012)  . Chronic kidney disease    kidney stones  . Colon polyps    adenomatous and hyperplastic-  . Constipation   . Depression   . Eczema   . Epilepsy ([place])    "been having them right often here lately" (04/22/2012)  . Fatty liver   . JYNWGNFA(213.0)    "1/wk" (04/22/2012)  . High cholesterol   . History of kidney stones   . Osteoarthritis     [name] 04/22/2012  . Other convulsions 05/21/12   non-epileptic spells  . Rectal bleed    in toilet- bright red  . Restless leg   . Seizures ([place])   . Vertigo      Family History  Problem Relation Age of Onset  . Cancer Mother   . Depression Father   . Cancer Brother   . Diabetes Brother   . Cancer Maternal Aunt   . Diabetes Maternal Aunt   . Bipolar disorder Son   . Drug abuse Son      Social History:  reports that  has never smoked. she has never used smokeless tobacco. She reports that she does not drink alcohol or use drugs.   Exam: Current vital signs: BP (!) 112/91   Pulse 70   Resp 18   SpO2 95%  Vital signs in last 24 hours: Pulse Rate:  [51-101] 70 (02/19 2200) Resp:  [13-18] 18 (02/19 1800) BP: (107-139)/(76-91) 112/91 (02/19 2200) SpO2:  [93 %-100 %] 95 % (02/19 2200)   Physical Exam  Constitutional: Appears well-developed and well-nourished.  Psych: Affect appropriate to situation Eyes: No scleral injection HENT: No OP obstrucion Head: Normocephalic.  Cardiovascular: Normal rate and regular rhythm.  Respiratory: Effort normal, non-labored breathing GI: Soft.  No distension. There is no tenderness.  Skin: WDI  Neuro: Mental Status: Patient is awake, alert, oriented to person, place, month, year, and situation. Patient is able to give a clear  and coherent history. No signs of aphasia or neglect Cranial Nerves: II: Visual Fields are full. Pupils are equal, round, and reactive to light.   III,IV, VI: EOMI without ptosis or diploplia.  V: Facial sensation is symmetric to temperature VII: Facial movement is symmetric.  VIII: hearing is intact to voice X: Uvula elevates symmetrically XI: Shoulder shrug is symmetric. XII: tongue is midline without atrophy or fasciculations.  Motor: Tone is normal. Bulk is normal. 5/5 strength was present in all four extremities.  Sensory: Sensation is symmetric to light touch and temperature in the arms and  legs. 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  The increase in her seizure frequencies corresponded well to a decrease in her levetiracetam dose and therefore I suspect this is the etiology.  Why she had a flurry today I am not certain, no evidence of infection or electrolyte abnormalities.  She seems to have calmed down with Ativan and I would favor increasing her Keppra, since there was some question of some somnolence with 1500 mg twice daily, would try 1 g twice daily.  Recommendations: 1) increase Keppra to 1 g twice daily 2) continue lamotrigine at home dose of 150 mg twice daily  Roland Rack, MD [facility] [phone]  If 7pm- 7am, please page neurology on call as listed in [place].

## 2017-03-03 NOTE — ED Notes (Signed)
Pt ambulated to bathroom with RN. While on toilet, pt began to seize and witnessed by this RN. As we stood pt up to get onto the stretcher, pt began to respond. MD notified and safety measures put in place. Pt in bed, seizure pads in place.

## 2017-03-03 NOTE — H&P (Signed)
[facility] History and Physical   Patient: [name] TIW:[id number]   PCP: [name], MD DOB: 08/03/1965   DOA: 03/03/2017   DOS: 03/03/2017   DOS: the patient was seen and examined on 03/03/2017  Patient coming from: The patient is coming from home.  Chief Complaint: seizures  HPI: [name] is a 52 y.o. female with Past medical history of chronic bronchitis, chronic kidney disease stage III, HLD, seizure disorder. Patient presented with episodes of unresponsiveness. History is primarily taken from chart review.  Per patient's daughter patient started having shaking episodes like seizures today during the morning.  Later on she also had a fall because of similar episode. EMS was called initially but patient did not wanted to be transported to ER. Patient continued to have seizure-like episodes with confusion and therefore was brought again to the hospital. While waiting in triage patient had one more episode as well as ED Course: In the ER had an episode of seizure while in the restroom. Neurology was consulted, patient was started again on Keppra as well as given Ativan.  At the time of my evaluation patient denies any acute complaint.  No chest pain or shortness of breath no nausea no vomiting.  No headache no dizziness.  At her baseline ambulates without support And is independent for most of her ADL; manages her medication on her own.  Review of Systems: as mentioned in the history of present illness.  All other systems reviewed and are negative.  Past Medical History:  Diagnosis Date  . Arthritis    "knees" (04/22/2012)  . Bronchitis   . Chronic bronchitis ([place])    "yearly; when the weather changes" (04/22/2012)  . Chronic kidney disease    kidney stones  . Colon polyps    adenomatous and hyperplastic-  . Constipation   . Depression   . Eczema   . Epilepsy ([place])    "been having them right often here lately" (04/22/2012)  . Fatty liver   .  [id number](734.1)    "1/wk" (04/22/2012)  . High cholesterol   . History of kidney stones   . Osteoarthritis    [name] 04/22/2012  . Other convulsions 05/21/12   non-epileptic spells  . Rectal bleed    in toilet- bright red  . Restless leg   . Seizures ([place])   . Vertigo    Past Surgical History:  Procedure Laterality Date  . ABDOMINAL HYSTERECTOMY  2001  . BLADDER SUSPENSION    . BUNIONECTOMY Left 2000  . CESAREAN SECTION  1987; 1988  . COLONOSCOPY    . JOINT REPLACEMENT    . MASS EXCISION  10/22/2011   Procedure: EXCISION MASS;  Surgeon: [name], MD;  Location: [place];  Service: General;  Laterality: Right;  excision right buttock mass  . TOTAL HIP ARTHROPLASTY Left 1993; 1995; 2000  . TOTAL KNEE ARTHROPLASTY Left 05/07/2015   Procedure: TOTAL LEFT KNEE ARTHROPLASTY;  Surgeon: [name], MD;  Location: [facility];  Service: Orthopedics;  Laterality: Left;  . TOTAL KNEE ARTHROPLASTY Right 10/01/2015   Procedure: RIGHT TOTAL KNEE ARTHROPLASTY;  Surgeon: [name], MD;  Location: [facility];  Service: Orthopedics;  Laterality: Right;   Social History:  reports that  has never smoked. she has never used smokeless tobacco. She reports that she does not drink alcohol or use drugs.  Allergies  Allergen Reactions  . Acetaminophen Other (See Comments)    Has fatty deposits on liver  . Benadryl [Diphenhydramine  Hcl] Other (See Comments)    hyperactivity and seizures  . Codeine Itching and Rash    seizures  . Dilantin [Phenytoin Sodium Extended] Other (See Comments)    Elevated LFT's  . Melatonin Other (See Comments)    seizures  . Ultram [Tramadol Hcl] Other (See Comments)    Seizures  . Vimpat [Lacosamide] Other (See Comments)    Severe dizziness  . Betadine [Povidone Iodine] Rash  . Latex Rash  . Penicillins Itching and Rash    Has patient had a PCN reaction causing immediate rash, facial/tongue/throat swelling, SOB or lightheadedness with hypotension: No Has patient had  a PCN reaction causing severe rash involving mucus membranes or skin necrosis: No Has patient had a PCN reaction that required hospitalization No Has patient had a PCN reaction occurring within the last 10 years: No If all of the above answers are "NO", then may proceed with Cephalosporin use.  . Sulfa Antibiotics Rash  . Tape Rash    Paper tape please  . Vicodin [Hydrocodone-Acetaminophen] Itching   Family History  Problem Relation Age of Onset  . Cancer Mother   . Depression Father   . Cancer Brother   . Diabetes Brother   . Cancer Maternal Aunt   . Diabetes Maternal Aunt   . Bipolar disorder Son   . Drug abuse Son      Prior to Admission medications   Medication Sig Start Date End Date Taking? Authorizing Provider  amoxicillin-clavulanate (AUGMENTIN) 875-125 MG tablet Take 1 tablet by mouth 2 (two) times daily. 02/03/17  Yes Stacks, Cletus Gash, MD  clonazePAM (KLONOPIN) 0.5 MG tablet TAKE 1 TABLET BY MOUTH ONCE DAILY AS NEEDED Patient taking differently: TAKE 5.0 mg TABLET BY MOUTH ONCE DAILY AS NEEDED 02/09/17  Yes Stacks, Cletus Gash, MD  Cyanocobalamin (VITAMIN B 12 PO) Take 1 tablet by mouth daily.   Yes [provider]  FLUoxetine HCl 60 MG TABS Take 60 mg by mouth daily. 02/04/17  Yes Norman Clay, MD  gabapentin (NEURONTIN) 600 MG tablet Take 1 tablet (600 mg total) by mouth 3 (three) times daily. 02/03/17  Yes [name], MD  lamoTRIgine (LAMICTAL) 150 MG tablet Take 1 tablet (150 mg total) by mouth 2 (two) times daily. 02/11/17 03/13/17 Yes Murray, Alyssa B, PA-C  levETIRAcetam (KEPPRA) 500 MG tablet Take 3 tablets (1,500 mg total) by mouth 2 (two) times daily. Patient taking differently: Take 500 mg by mouth 2 (two) times daily.  04/09/16  Yes Dohmeier, Asencion Partridge, MD  mirtazapine (REMERON) 7.5 MG tablet Take 1 tablet (7.5 mg total) by mouth at bedtime. 02/04/17  Yes Norman Clay, MD  pramipexole (MIRAPEX) 0.5 MG tablet Take 1 tablet (0.5 mg total) by mouth 2 (two) times  daily. 02/03/17  Yes Stacks, Cletus Gash, MD  RAPAFLO 4 MG CAPS capsule Take 4 mg by mouth daily. 02/06/17  Yes [provider]  mupirocin ointment (BACTROBAN) 2 % Place 1 application into the nose 2 (two) times daily. Patient not taking: Reported on 03/03/2017 03/02/17   [name], MD  oxyCODONE (OXY IR/ROXICODONE) 5 MG immediate release tablet Take 1 tablet (5 mg total) by mouth every 4 (four) hours as needed for severe pain. Patient not taking: Reported on 03/03/2017 02/04/17   [name], MD    Physical Exam: Vitals:   03/03/17 2200 03/03/17 2230 03/03/17 2245 03/03/17 2323  BP: (!) 112/91 123/87 118/89 101/75  Pulse: 70 70 74 70  Resp:      Temp:    98.9  F (37.2 C)  TempSrc:    Oral  SpO2: 95% 97% 96%   Weight:    69.7 kg (153 lb 10.6 oz)  Height:    5' (1.524 m)    General: Alert, Awake and Oriented to Time, Place and Person. Appear in moderate distress, affect appropriate Eyes: PERRL, Conjunctiva normal ENT: Oral Mucosa clear moist Neck: no JVD, no Abnormal Mass Or lumps Cardiovascular: S1 and S2 Present,  Murmur, Peripheral Pulses Present Respiratory: normal respiratory effort, Bilateral Air entry equal and Decreased, no use of accessory muscle, Clear to Auscultation, no Crackles, no wheezes Abdomen: Bowel Sound present, Soft and no tenderness, no hernia Skin: no redness, no Rash, no induration Extremities: no Pedal edema, no calf tenderness Neurologic: Grossly no focal neuro deficit. Bilaterally Equal motor strength  Labs on Admission:  CBC: Recent Labs  Lab 03/03/17 1742  WBC 7.3  NEUTROABS 4.9  HGB 13.7  HCT 40.9  MCV 88.5  PLT 160   Basic Metabolic Panel: Recent Labs  Lab 03/03/17 1742  NA 141  K 4.9  CL 106  CO2 21*  GLUCOSE 96  BUN 7  CREATININE 0.76  CALCIUM 9.5   GFR: Estimated Creatinine Clearance: 72.5 mL/min (by C-G formula based on SCr of 0.76 mg/dL). Liver Function Tests: Recent Labs  Lab 03/03/17 1742  AST 63*  ALT 45    ALKPHOS 64  BILITOT 0.9  PROT 7.4  ALBUMIN 4.3   No results for input(s): LIPASE, AMYLASE in the last 168 hours. No results for input(s): AMMONIA in the last 168 hours. Coagulation Profile: No results for input(s): INR, PROTIME in the last 168 hours. Cardiac Enzymes: No results for input(s): CKTOTAL, CKMB, CKMBINDEX, TROPONINI in the last 168 hours. BNP (last 3 results) No results for input(s): PROBNP in the last 8760 hours. HbA1C: No results for input(s): HGBA1C in the last 72 hours. CBG: Recent Labs  Lab 03/03/17 1736  GLUCAP 90   Lipid Profile: No results for input(s): CHOL, HDL, LDLCALC, TRIG, CHOLHDL, LDLDIRECT in the last 72 hours. Thyroid Function Tests: No results for input(s): TSH, T4TOTAL, FREET4, T3FREE, THYROIDAB in the last 72 hours. Anemia Panel: No results for input(s): VITAMINB12, FOLATE, FERRITIN, TIBC, IRON, RETICCTPCT in the last 72 hours. Urine analysis:    Component Value Date/Time   COLORURINE YELLOW 03/03/2017 1923   APPEARANCEUR CLEAR 03/03/2017 1923   APPEARANCEUR Clear 01/30/2017 1020   LABSPEC 1.019 03/03/2017 1923   PHURINE 7.0 03/03/2017 1923   GLUCOSEU NEGATIVE 03/03/2017 [place] NEGATIVE 03/03/2017 [place] NEGATIVE 03/03/2017 1923   BILIRUBINUR Negative 01/30/2017 [place] 03/03/2017 1923   PROTEINUR NEGATIVE 03/03/2017 1923   UROBILINOGEN negative 04/19/2014 1332   UROBILINOGEN 0.2 12/04/2013 1414   NITRITE NEGATIVE 03/03/2017 1923   LEUKOCYTESUR NEGATIVE 03/03/2017 1923   LEUKOCYTESUR Negative 01/30/2017 1020    Radiological Exams on Admission: Dg Shoulder Right  Result Date: 03/03/2017 CLINICAL DATA:  Right shoulder pain after seizure. EXAM: RIGHT SHOULDER - 2+ VIEW COMPARISON:  11/14/2014 FINDINGS: There is no evidence of fracture or dislocation. There is no evidence of arthropathy or other focal bone abnormality. Soft tissues are unremarkable. IMPRESSION: Negative for fracture or joint  dislocations. Electronically Signed   By: Ashley Royalty M.D.   On: 03/03/2017 18:47   Dg Knee 2 Views Right  Result Date: 03/03/2017 CLINICAL DATA:  Right knee pain after seizure. EXAM: RIGHT KNEE - 1-2 VIEW COMPARISON:  None. FINDINGS: Intact right total knee  arthroplasty with patellar resurfacing. No hardware loosening or failure. No fracture. No joint effusion nor joint dislocations. Unremarkable soft tissues. IMPRESSION: Intact right total knee arthroplasty.  No acute osseous abnormality. Electronically Signed   By: Ashley Royalty M.D.   On: 03/03/2017 18:48   Ct Head Wo Contrast  Result Date: 03/03/2017 CLINICAL DATA:  Status post fall with witnessed seizures today. EXAM: CT HEAD WITHOUT CONTRAST TECHNIQUE: Contiguous axial images were obtained from the base of the skull through the vertex without intravenous contrast. COMPARISON:  February 26, 2017 FINDINGS: Brain: No evidence of acute infarction, hemorrhage, hydrocephalus, extra-axial collection or mass lesion/mass effect. Vascular: No hyperdense vessel or unexpected calcification. Skull: Normal. Negative for fracture or focal lesion. Sinuses/Orbits: No acute finding. Other: None. IMPRESSION: No focal acute intracranial abnormality identified. Electronically Signed   By: Abelardo Diesel M.D.   On: 03/03/2017 19:03   Dg Chest Portable 1 View  Result Date: 03/03/2017 CLINICAL DATA:  Seizure.  No chest complaints. EXAM: PORTABLE CHEST 1 VIEW COMPARISON:  Chest x-ray dated February 27, 2017. FINDINGS: The heart size and mediastinal contours are within normal limits. Both lungs are clear. The visualized skeletal structures are unremarkable. IMPRESSION: No active disease. Electronically Signed   By: Titus Dubin M.D.   On: 03/03/2017 20:00   Assessment/Plan 1. Seizure disorder. Status epilepticus. Patient appears awake and oriented. Received IV Ativan. Also loaded with IV Keppra again. Neurology was consulted, appreciate input. Continue with  increased dose of Keppra as well as continue rest of the other home medication. Monitor overnight on telemetry.  2.Fall. Presented with a fall. X-ray knee x-ray shoulder unremarkable. CT had unremarkable for any acute fracture as well. Chest x-ray normal as well. Monitor. PT OT consulted.  3.Depression and fibromyalgia. Continue home regimen  Nutrition: Regular diet pending stroke swallow evaluation. DVT Prophylaxis: subcutaneous Heparin  Advance goals of care discussion: full code   Consults: neurogly   Family Communication: no family was present at bedside, at the time of interview.  Disposition: Admitted as inpatient, telemetry unit. Likely to be discharged home, in 2-3 days.  Author: Berle Mull, MD [facility] Pager: [phone] 03/03/2017  If 7PM-7AM, please contact night-coverage [email or web address] Password [email or web address]

## 2017-03-03 NOTE — ED Notes (Signed)
Seizure witnessed in lobby.

## 2017-03-03 NOTE — ED Triage Notes (Signed)
Pt arrives pov from home after multiple witnessed seizures today. Family reports she fell and hit head in store after first seizure today and continues to have then since. PT had seizure in triage lasting ~30 seconds.

## 2017-03-03 NOTE — ED Notes (Signed)
Patient transported to CT 

## 2017-03-04 LAB — CBC
HCT: 37.5 % (ref 36.0–46.0)
Hemoglobin: 12.3 g/dL (ref 12.0–15.0)
MCH: 28.9 pg (ref 26.0–34.0)
MCHC: 32.8 g/dL (ref 30.0–36.0)
MCV: 88.2 fL (ref 78.0–100.0)
Platelets: 289 10*3/uL (ref 150–400)
RBC: 4.25 MIL/uL (ref 3.87–5.11)
RDW: 13.8 % (ref 11.5–15.5)
WBC: 7.4 10*3/uL (ref 4.0–10.5)

## 2017-03-04 LAB — COMPREHENSIVE METABOLIC PANEL
ALT: 34 U/L (ref 14–54)
AST: 35 U/L (ref 15–41)
Albumin: 3.6 g/dL (ref 3.5–5.0)
Alkaline Phosphatase: 62 U/L (ref 38–126)
Anion gap: 12 (ref 5–15)
BUN: 6 mg/dL (ref 6–20)
CO2: 25 mmol/L (ref 22–32)
Calcium: 9 mg/dL (ref 8.9–10.3)
Chloride: 106 mmol/L (ref 101–111)
Creatinine, Ser: 0.71 mg/dL (ref 0.44–1.00)
GFR calc Af Amer: >60 mL/min (ref >60)
GFR calc non Af Amer: >60 mL/min (ref >60)
Glucose, Bld: 83 mg/dL (ref 65–99)
Potassium: 3.3 mmol/L — ABNORMAL LOW (ref 3.5–5.1)
Sodium: 143 mmol/L (ref 135–145)
Total Bilirubin: 0.6 mg/dL (ref 0.3–1.2)
Total Protein: 6.6 g/dL (ref 6.5–8.1)

## 2017-03-04 LAB — HIV ANTIBODY (ROUTINE TESTING W REFLEX): HIV Screen 4th Generation wRfx: NONREACTIVE

## 2017-03-04 MED ORDER — HEPARIN SODIUM (PORCINE) 5000 UNIT/ML IJ SOLN
5000.0000 [IU] | Freq: Three times a day (TID) | INTRAMUSCULAR | Status: DC
  Administered 2017-03-04 – 2017-03-05 (×4): 5000 [IU] via SUBCUTANEOUS
  Filled 2017-03-04 (×4): qty 1

## 2017-03-04 MED ORDER — ONDANSETRON HCL 4 MG PO TABS: 4.0000 mg | ORAL_TABLET | Freq: Four times a day (QID) | ORAL | Status: DC | PRN

## 2017-03-04 MED ORDER — LAMOTRIGINE 100 MG PO TABS
150.0000 mg | ORAL_TABLET | Freq: Two times a day (BID) | ORAL | Status: DC
  Administered 2017-03-04 – 2017-03-05 (×3): 150 mg via ORAL
  Filled 2017-03-04 (×3): qty 2

## 2017-03-04 MED ORDER — POTASSIUM CHLORIDE CRYS ER 20 MEQ PO TBCR
40.0000 meq | EXTENDED_RELEASE_TABLET | Freq: Once | ORAL | Status: AC
  Administered 2017-03-04: 40 meq via ORAL
  Filled 2017-03-04: qty 2

## 2017-03-04 MED ORDER — CLONAZEPAM 0.5 MG PO TABS: 0.5000 mg | ORAL_TABLET | Freq: Every day | ORAL | Status: DC | PRN

## 2017-03-04 MED ORDER — GABAPENTIN 600 MG PO TABS
600.0000 mg | ORAL_TABLET | Freq: Three times a day (TID) | ORAL | Status: DC
  Administered 2017-03-04 – 2017-03-05 (×4): 600 mg via ORAL
  Filled 2017-03-04 (×4): qty 1

## 2017-03-04 MED ORDER — SENNOSIDES-DOCUSATE SODIUM 8.6-50 MG PO TABS: 1.0000 | ORAL_TABLET | Freq: Every evening | ORAL | Status: DC | PRN

## 2017-03-04 MED ORDER — LORAZEPAM 2 MG/ML IJ SOLN: 1.0000 mg | INTRAMUSCULAR | Status: DC | PRN

## 2017-03-04 MED ORDER — FLUOXETINE HCL 20 MG PO CAPS
60.0000 mg | ORAL_CAPSULE | Freq: Every day | ORAL | Status: DC
  Administered 2017-03-04 – 2017-03-05 (×2): 60 mg via ORAL
  Filled 2017-03-04 (×2): qty 3

## 2017-03-04 MED ORDER — PRAMIPEXOLE DIHYDROCHLORIDE 0.125 MG PO TABS
0.5000 mg | ORAL_TABLET | Freq: Two times a day (BID) | ORAL | Status: DC
  Administered 2017-03-04 – 2017-03-05 (×3): 0.5 mg via ORAL
  Filled 2017-03-04 (×2): qty 4

## 2017-03-04 MED ORDER — MIRTAZAPINE 15 MG PO TABS
7.5000 mg | ORAL_TABLET | Freq: Every day | ORAL | Status: DC
  Administered 2017-03-04 (×2): 7.5 mg via ORAL
  Filled 2017-03-04 (×2): qty 1

## 2017-03-04 MED ORDER — LEVETIRACETAM 500 MG PO TABS
1000.0000 mg | ORAL_TABLET | Freq: Two times a day (BID) | ORAL | Status: DC
  Administered 2017-03-04 – 2017-03-05 (×3): 1000 mg via ORAL
  Filled 2017-03-04 (×4): qty 2

## 2017-03-04 MED ORDER — ACETAMINOPHEN 650 MG RE SUPP: 650.0000 mg | Freq: Four times a day (QID) | RECTAL | Status: DC | PRN

## 2017-03-04 MED ORDER — LEVETIRACETAM 750 MG PO TABS: 1500.0000 mg | ORAL_TABLET | Freq: Two times a day (BID) | ORAL | Status: DC

## 2017-03-04 MED ORDER — ACETAMINOPHEN 325 MG PO TABS
650.0000 mg | ORAL_TABLET | Freq: Four times a day (QID) | ORAL | Status: DC | PRN
  Administered 2017-03-04 – 2017-03-05 (×4): 650 mg via ORAL
  Filled 2017-03-04 (×4): qty 2

## 2017-03-04 MED ORDER — ONDANSETRON HCL 4 MG/2ML IJ SOLN: 4.0000 mg | Freq: Four times a day (QID) | INTRAMUSCULAR | Status: DC | PRN

## 2017-03-04 MED ORDER — TAMSULOSIN HCL 0.4 MG PO CAPS
0.4000 mg | ORAL_CAPSULE | Freq: Every day | ORAL | Status: DC
  Administered 2017-03-04: 0.4 mg via ORAL
  Filled 2017-03-04: qty 1

## 2017-03-04 NOTE — Progress Notes (Signed)
Pt did not urinate from 2330 she came to the unit till this morning. She denies urge to urinate and was bladder scanned for 378 ml. MD on call was notified but she recommends that we wait until patient feels that she needs to urinate. Pt denies any discomfort at this time. The day shift nurse is aware of the issue.

## 2017-03-04 NOTE — Progress Notes (Signed)
Neurology Progress Note   S:// Seen and examined No new complaints  O:// Current vital signs: BP 109/76 (BP Location: Left Arm)   Pulse 66   Temp 98.3 F (36.8 C) (Oral)   Resp 17   Ht 5' (1.524 m)   Wt 69.6 kg (153 lb 7 oz)   SpO2 99%   BMI 29.97 kg/m   Vital signs in last 24 hours: Temp:  [98.2 F (36.8 C)-98.9 F (37.2 C)] 98.3 F (36.8 C) (02/20 0820) Pulse Rate:  [51-101] 66 (02/20 0820) Resp:  [13-18] 17 (02/20 0820) BP: (101-139)/(73-91) 109/76 (02/20 0820) SpO2:  [93 %-100 %] 99 % (02/20 0820) Weight:  [69.6 kg (153 lb 7 oz)-69.7 kg (153 lb 10.6 oz)] 69.6 kg (153 lb 7 oz) (02/20 0500) GENERAL: Awake, alert in NAD HEENT: - Normocephalic and atraumatic, dry mm, no LN++, no Thyromegally LUNGS - Clear to auscultation bilaterally with no wheezes CV - S1S2 RRR, no m/r/g, equal pulses bilaterally. ABDOMEN - Soft, nontender, nondistended with normoactive BS Ext: warm, well perfused, intact peripheral pulses NEURO:  Mental Status: AA&Ox3  Language: speech is non-dysarthric.  Naming, repetition, fluency, and comprehension intact. Cranial Nerves: PERRL EOMI, visual fields full, no facial asymmetry, facial sensation intact, hearing intact, tongue/uvula/soft palate midline, normal sternocleidomastoid and trapezius muscle strength. No evidence of tongue atrophy or fibrillation Motor: 5/5 all over Tone: is normal and bulk is normal Sensation- Intact to light touch bilaterally Coordination: FTN intact bilaterally, no ataxia in BLE. Gait- deferred   Medications  Current Facility-Administered Medications:  .  acetaminophen (TYLENOL) tablet 650 mg, 650 mg, Oral, Q6H PRN, 650 mg at 03/04/17 0147 **OR** acetaminophen (TYLENOL) suppository 650 mg, 650 mg, Rectal, Q6H PRN, Lavina Hamman, MD .  clonazePAM [facility] [place]) tablet 0.5 mg, 0.5 mg, Oral, Daily PRN, Lavina Hamman, MD .  FLUoxetine (PROZAC) capsule 60 mg, 60 mg, Oral, Daily, Berle Mull M, MD .  gabapentin (NEURONTIN)  tablet 600 mg, 600 mg, Oral, TID, Lavina Hamman, MD .  heparin injection 5,000 Units, 5,000 Units, Subcutaneous, Q8H, Lavina Hamman, MD, 5,000 Units at 03/04/17 0541 .  lamoTRIgine (LAMICTAL) tablet 150 mg, 150 mg, Oral, BID, Lavina Hamman, MD .  levETIRAcetam (KEPPRA) tablet 1,000 mg, 1,000 mg, Oral, BID, Lavina Hamman, MD .  LORazepam (ATIVAN) injection 1 mg, 1 mg, Intravenous, Q4H PRN, Lavina Hamman, MD .  mirtazapine (REMERON) tablet 7.5 mg, 7.5 mg, Oral, QHS, Lavina Hamman, MD, 7.5 mg at 03/04/17 0117 .  ondansetron (ZOFRAN) tablet 4 mg, 4 mg, Oral, Q6H PRN **OR** ondansetron (ZOFRAN) injection 4 mg, 4 mg, Intravenous, Q6H PRN, Lavina Hamman, MD .  potassium chloride SA (K-DUR,KLOR-CON) CR tablet 40 mEq, 40 mEq, Oral, Once, Rizwan, Saima, MD .  pramipexole (MIRAPEX) tablet 0.5 mg, 0.5 mg, Oral, BID, Berle Mull M, MD .  senna-docusate (Senokot-S) tablet 1 tablet, 1 tablet, Oral, QHS PRN, Lavina Hamman, MD .  tamsulosin [facility]) capsule 0.4 mg, 0.4 mg, Oral, QPC supper, Lavina Hamman, MD Labs CBC    Component Value Date/Time   WBC 7.4 03/04/2017 0513   RBC 4.25 03/04/2017 0513   HGB 12.3 03/04/2017 0513   HGB 11.7 12/01/2016 1509   HCT 37.5 03/04/2017 0513   HCT 36.1 12/01/2016 1509   PLT 289 03/04/2017 0513   PLT 296 12/01/2016 1509   MCV 88.2 03/04/2017 0513   MCV 89 12/01/2016 1509   MCH 28.9 03/04/2017 0513   MCHC 32.8 03/04/2017  0513   RDW 13.8 03/04/2017 0513   RDW 14.7 12/01/2016 1509   LYMPHSABS 1.8 03/03/2017 1742   LYMPHSABS 2.8 12/01/2016 1509   MONOABS 0.6 03/03/2017 1742   EOSABS 0.0 03/03/2017 1742   EOSABS 0.0 12/01/2016 1509   BASOSABS 0.0 03/03/2017 1742   BASOSABS 0.0 12/01/2016 1509    CMP     Component Value Date/Time   NA 143 03/04/2017 0513   NA 142 02/03/2017 1728   K 3.3 (L) 03/04/2017 0513   CL 106 03/04/2017 0513   CO2 25 03/04/2017 0513   GLUCOSE 83 03/04/2017 0513   BUN 6 03/04/2017 0513   BUN 10 02/03/2017 1728    CREATININE 0.71 03/04/2017 0513   CALCIUM 9.0 03/04/2017 0513   PROT 6.6 03/04/2017 0513   PROT 6.8 02/03/2017 1728   ALBUMIN 3.6 03/04/2017 0513   ALBUMIN 4.4 02/03/2017 1728   AST 35 03/04/2017 0513   ALT 34 03/04/2017 0513   ALKPHOS 62 03/04/2017 0513   BILITOT 0.6 03/04/2017 0513   BILITOT 0.4 02/03/2017 1728   GFRNONAA >60 03/04/2017 0513   GFRAA >60 03/04/2017 0513    Imaging I have reviewed images in epic and the results pertinent to this consultation are: CT head-no acute findings  Assessment:  52 year old with a history of possible primary generalized epilepsy with increasing frequency of seizures probably secondary to lowering of the dose of her Keppra. She has been diagnosed in the past with seizures and pseudoseizures, needs further evaluation as an outpatient for that. For now, continue with current medications as below. Maintain seizure precautions.  Impression: Primary generalized epilepsy with breakthrough seizures  Recommendations: Continue with Keppra 1 g twice daily Continue Lamictal 150 twice daily Maintain seizure precautions Follow-up with outpatient neurology  She can be discharged if she continues to be stable over the next 12-24 hours and has no further seizure activity.  Neurology will be available as needed.  Please call with questions.  -- Amie Portland, MD [facility] Pager: [phone] If 7pm to 7am, please call on call as listed on AMION.  SEIZURE PRECAUTIONS Per [facility] statutes, patients with seizures are not allowed to drive until they have been seizure-free for six months.   Use caution when using heavy equipment or power tools. Avoid working on ladders or at heights. Take showers instead of baths. Ensure the water temperature is not too high on the home water heater. Do not go swimming alone. Do not lock yourself in a room alone (i.e. bathroom). When caring for infants or small children, sit down when holding,  feeding, or changing them to minimize risk of injury to the child in the event you have a seizure. Maintain good sleep hygiene. Avoid alcohol.   If patient has another seizure, call 911 and bring them back to the ED if: A. The seizure lasts longer than 5 minutes.  B. The patient doesn't wake shortly after the seizure or has new problems such as difficulty seeing, speaking or moving following the seizure C. The patient was injured during the seizure D. The patient has a temperature over 102 F (39C) E. The patient vomited during the seizure and now is having trouble breathing

## 2017-03-04 NOTE — Evaluation (Signed)
Physical Therapy Evaluation & Discharge Patient Details Name: [name] MRN: [id number] DOB: 26-Sep-1965 Today's Date: 03/04/2017   History of Present Illness  Pt is a 52 y/o female admitted after sustaining a fall secondary to having a seizure. All imagining was negative. PMH including but not limited epilepsy, CKD, L THA (1993, 1995, 2000), L TKA (2017), R TKA (2017).  Clinical Impression  Pt presented supine in bed with HOB elevated, awake and willing to participate in therapy session. Prior to admission, pt reported that she was independent with all functional mobility and ADLs. Pt lives with her husband and has family that can assist 24/7 if needed. Pt's daughter-in-law drives her and her husband wherever they need to go. Pt currently performing bed mobility with supervision, transfers with supervision and ambulation without use of an AD with min guard for safety. Pt participated in a higher level balance assessment and scored a 20/24 on the DGI, which does not put her at an increased risk for falls. Pt would greatly benefit from further therapy services upon d/c. However, no further acute PT needs identified at this time. PT signing off.     Follow Up Recommendations Home health PT    Equipment Recommendations  None recommended by PT    Recommendations for Other Services       Precautions / Restrictions Precautions Precautions: Other (comment)(seizures) Restrictions Weight Bearing Restrictions: No      Mobility  Bed Mobility Overal bed mobility: Needs Assistance Bed Mobility: Supine to Sit;Sit to Supine     Supine to sit: Supervision Sit to supine: Supervision   General bed mobility comments: increased time, supervision for safety  Transfers Overall transfer level: Needs assistance Equipment used: None Transfers: Sit to/from Stand Sit to Stand: Supervision         General transfer comment: supervision for safety  Ambulation/Gait Ambulation/Gait assistance:  Min guard Ambulation Distance (Feet): 300 Feet Assistive device: None Gait Pattern/deviations: Step-through pattern;Decreased stride length;Drifts right/left Gait velocity: able to vary Gait velocity interpretation: at or above normal speed for age/gender General Gait Details: mild instability but no overt LOB or need for physical assistance, no use of any AD  Stairs Stairs: Yes Stairs assistance: Min guard Stair Management: Two rails;Alternating pattern;Forwards Number of Stairs: 2 General stair comments: no instability or LOB, min guard for safety   Wheelchair Mobility    Modified Rankin (Stroke Patients Only)       Balance Overall balance assessment: Needs assistance Sitting-balance support: Feet supported Sitting balance-Leahy Scale: Good     Standing balance support: During functional activity;No upper extremity supported Standing balance-Leahy Scale: Fair                   Standardized Balance Assessment Standardized Balance Assessment : Dynamic Gait Index   Dynamic Gait Index Level Surface: Normal Change in Gait Speed: Normal Gait with Horizontal Head Turns: Mild Impairment Gait with Vertical Head Turns: Normal Gait and Pivot Turn: Mild Impairment Step Over Obstacle: Normal Step Around Obstacles: Normal Steps: Moderate Impairment Total Score: 20       Pertinent Vitals/Pain Pain Assessment: Faces Faces Pain Scale: Hurts even more Pain Location: headache Pain Descriptors / Indicators: Headache Pain Intervention(s): Monitored during session;Repositioned    Home Living Family/patient expects to be discharged to:: Private residence Living Arrangements: Spouse/significant other Available Help at Discharge: Family;Available 24 hours/day Type of Home: House Home Access: Level entry     Home Layout: One level Home Equipment: Walker - 2 wheels;Cane -  single point;Bedside commode      Prior Function Level of Independence: Independent                Hand Dominance        Extremity/Trunk Assessment   Upper Extremity Assessment Upper Extremity Assessment: Overall WFL for tasks assessed    Lower Extremity Assessment Lower Extremity Assessment: Overall WFL for tasks assessed       Communication   Communication: No difficulties  Cognition Arousal/Alertness: Awake/alert Behavior During Therapy: WFL for tasks assessed/performed Overall Cognitive Status: Within Functional Limits for tasks assessed                                        General Comments      Exercises     Assessment/Plan    PT Assessment All further PT needs can be met in the next venue of care  PT Problem List Decreased balance;Decreased mobility;Decreased coordination       PT Treatment Interventions      PT Goals (Current goals can be found in the Care Plan section)  Acute Rehab PT Goals Patient Stated Goal: return home    Frequency     Barriers to discharge        Co-evaluation               AM-PAC PT "6 Clicks" Daily Activity  Outcome Measure Difficulty turning over in bed (including adjusting bedclothes, sheets and blankets)?: None Difficulty moving from lying on back to sitting on the side of the bed? : None Difficulty sitting down on and standing up from a chair with arms (e.g., wheelchair, bedside commode, etc,.)?: None Help needed moving to and from a bed to chair (including a wheelchair)?: None Help needed walking in hospital room?: A Little Help needed climbing 3-5 steps with a railing? : A Little 6 Click Score: 22    End of Session Equipment Utilized During Treatment: Gait belt Activity Tolerance: Patient tolerated treatment well Patient left: in bed;with call bell/phone within reach;with bed alarm set Nurse Communication: Mobility status PT Visit Diagnosis: Other abnormalities of gait and mobility (R26.89)    Time: 3354-5625 PT Time Calculation (min) (ACUTE ONLY): 20 min   Charges:    PT Evaluation $PT Eval Low Complexity: 1 Low     PT G Codes:        [place], PT, [place] 03/04/2017, 3:39 PM

## 2017-03-04 NOTE — Progress Notes (Signed)
[facility]  Patient Status: Active (receiving services up to time of hospitalization)  [facility] is providing the following services: RN and PT  If patient discharges after hours, please call [phone].   [name] 03/04/2017, 12:30 PM

## 2017-03-04 NOTE — Progress Notes (Signed)
[facility] Progress Note  Patient: [name] VFI:[id number]   PCP: [name], MD DOB: 1965-09-13   DOA: 03/03/2017   DOS: 03/04/2017   Date of Service: the patient was seen and examined on 03/04/2017  Subjective: Feeling better, no nausea vomiting.  Does not remember seeing me, no further seizures during the hospital.  Brief hospital course: Pt. with PMH of chronic bronchitis, chronic kidney disease stage III, HLD, seizure disorder; admitted on 03/03/2017, presented with complaint of recurrent seizure, was found to have seizure. Currently further plan is continue current medication and monitor..  Assessment and Plan: 1. Seizure disorder. Status epilepticus. Patient appears awake and oriented. Received IV Ativan. Also loaded with IV Keppra again. Neurology was consulted, appreciate input. She was in 1500 mg of Keppra twice daily had excessive sleepiness and was reduced to 500 mg twice daily. Currently dose increased back again to 1 g twice daily and monitor.  2.Fall. Presented with a fall. X-ray knee x-ray shoulder unremarkable. CT had unremarkable for any acute fracture as well. Chest x-ray normal as well. Monitor. PT OT consulted.  3.Depression and fibromyalgia. Continue home regimen  4.  Hypokalemia. Replacing. We will recheck tomorrow.  Diet: regular diet DVT Prophylaxis: subcutaneous Heparin  Advance goals of care discussion: full code  Family Communication: no family was present at bedside, at the time of interview.   Disposition:  Discharge to home.  Consultants: neurology Procedures: none  Antibiotics: Anti-infectives (From admission, onward)   None       Objective: Physical Exam: Vitals:   03/04/17 0328 03/04/17 0500 03/04/17 0820 03/04/17 1214  BP: 117/73  109/76 121/79  Pulse: (!) 55  66 72  Resp: 18  17 17   Temp: 98.2 F (36.8 C)  98.3 F (36.8 C) 98.2 F (36.8 C)  TempSrc: Oral  Oral Oral  SpO2: 97%  99% 95%  Weight:  69.6  kg (153 lb 7 oz)    Height:        Intake/Output Summary (Last 24 hours) at 03/04/2017 1509 Last data filed at 03/03/2017 2011 Gross per 24 hour  Intake 100 ml  Output -  Net 100 ml   Filed Weights   03/03/17 2323 03/04/17 0500  Weight: 69.7 kg (153 lb 10.6 oz) 69.6 kg (153 lb 7 oz)   General: Alert, Awake and Oriented to Time, Place and Person. Appear in mild distress, affect appropriate Eyes: PERRL, Conjunctiva normal ENT: Oral Mucosa clear moist. Neck: no JVD, no Abnormal Mass Or lumps Cardiovascular: S1 and S2 Present, no Murmur, Peripheral Pulses Present Respiratory: normal respiratory effort, Bilateral Air entry equal and Decreased, no use of accessory muscle, Clear to Auscultation, no Crackles, no wheezes Abdomen: Bowel Sound present, Soft and n tenderness, ono hernia Skin: no redness, no Rash, no induration Extremities: no Pedal edema, no calf tenderness Neurologic: Grossly no focal neuro deficit. Bilaterally Equal motor strength  Data Reviewed: CBC: Recent Labs  Lab 03/03/17 1742 03/04/17 0513  WBC 7.3 7.4  NEUTROABS 4.9  --   HGB 13.7 12.3  HCT 40.9 37.5  MCV 88.5 88.2  PLT 294 416   Basic Metabolic Panel: Recent Labs  Lab 03/03/17 1742 03/04/17 0513  NA 141 143  K 4.9 3.3*  CL 106 106  CO2 21* 25  GLUCOSE 96 83  BUN 7 6  CREATININE 0.76 0.71  CALCIUM 9.5 9.0    Liver Function Tests: Recent Labs  Lab 03/03/17 1742 03/04/17 0513  AST 63* 35  ALT 45  34  ALKPHOS 64 62  BILITOT 0.9 0.6  PROT 7.4 6.6  ALBUMIN 4.3 3.6   No results for input(s): LIPASE, AMYLASE in the last 168 hours. No results for input(s): AMMONIA in the last 168 hours. Coagulation Profile: No results for input(s): INR, PROTIME in the last 168 hours. Cardiac Enzymes: No results for input(s): CKTOTAL, CKMB, CKMBINDEX, TROPONINI in the last 168 hours. BNP (last 3 results) No results for input(s): PROBNP in the last 8760 hours. CBG: Recent Labs  Lab 03/03/17 1736  GLUCAP  90   Studies: Dg Shoulder Right  Result Date: 03/03/2017 CLINICAL DATA:  Right shoulder pain after seizure. EXAM: RIGHT SHOULDER - 2+ VIEW COMPARISON:  11/14/2014 FINDINGS: There is no evidence of fracture or dislocation. There is no evidence of arthropathy or other focal bone abnormality. Soft tissues are unremarkable. IMPRESSION: Negative for fracture or joint dislocations. Electronically Signed   By: Ashley Royalty M.D.   On: 03/03/2017 18:47   Dg Knee 2 Views Right  Result Date: 03/03/2017 CLINICAL DATA:  Right knee pain after seizure. EXAM: RIGHT KNEE - 1-2 VIEW COMPARISON:  None. 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 No hardware loosening or failure. No fracture. No joint effusion nor joint dislocations. Unremarkable soft tissues. IMPRESSION: Intact right total knee arthroplasty.  No acute osseous abnormality. Electronically Signed   By: Ashley Royalty M.D.   On: 03/03/2017 18:48   Ct Head Wo Contrast  Result Date: 03/03/2017 CLINICAL DATA:  Status post fall with witnessed seizures today. EXAM: CT HEAD WITHOUT CONTRAST TECHNIQUE: Contiguous axial images were obtained from the base of the skull through the vertex without intravenous contrast. COMPARISON:  February 26, 2017 FINDINGS: Brain: No evidence of acute infarction, hemorrhage, hydrocephalus, extra-axial collection or mass lesion/mass effect. Vascular: No hyperdense vessel or unexpected calcification. Skull: Normal. Negative for fracture or focal lesion. Sinuses/Orbits: No acute finding. Other: None. IMPRESSION: No focal acute intracranial abnormality identified. Electronically Signed   By: Abelardo Diesel M.D.   On: 03/03/2017 19:03   Dg Chest Portable 1 View  Result Date: 03/03/2017 CLINICAL DATA:  Seizure.  No chest complaints. EXAM: PORTABLE CHEST 1 VIEW COMPARISON:  Chest x-ray dated February 27, 2017. FINDINGS: The heart size and mediastinal contours are within normal limits. Both lungs are clear. The  visualized skeletal structures are unremarkable. IMPRESSION: No active disease. Electronically Signed   By: Titus Dubin M.D.   On: 03/03/2017 20:00    Scheduled Meds: . FLUoxetine  60 mg Oral Daily  . gabapentin  600 mg Oral TID  . heparin  5,000 Units Subcutaneous Q8H  . lamoTRIgine  150 mg Oral BID  . levETIRAcetam  1,000 mg Oral BID  . mirtazapine  7.5 mg Oral QHS  . pramipexole  0.5 mg Oral BID  . tamsulosin  0.4 mg Oral QPC supper   Continuous Infusions: PRN Meds: acetaminophen **OR** acetaminophen, clonazePAM, LORazepam, ondansetron **OR** ondansetron (ZOFRAN) IV, senna-docusate  Time spent: 35 minutes  Author: Berle Mull, MD [facility] Pager: [phone] 03/04/2017 3:09 PM  If 7PM-7AM, please contact night-coverage at [email or web address], password [facility]

## 2017-03-05 LAB — BASIC METABOLIC PANEL
Anion gap: 10 (ref 5–15)
BUN: 8 mg/dL (ref 6–20)
CO2: 26 mmol/L (ref 22–32)
Calcium: 9.1 mg/dL (ref 8.9–10.3)
Chloride: 106 mmol/L (ref 101–111)
Creatinine, Ser: 0.8 mg/dL (ref 0.44–1.00)
GFR calc Af Amer: >60 mL/min (ref >60)
GFR calc non Af Amer: >60 mL/min (ref >60)
Glucose, Bld: 83 mg/dL (ref 65–99)
Potassium: 4.1 mmol/L (ref 3.5–5.1)
Sodium: 142 mmol/L (ref 135–145)

## 2017-03-05 MED ORDER — LEVETIRACETAM 1000 MG PO TABS: 1000.0000 mg | ORAL_TABLET | Freq: Two times a day (BID) | ORAL | 0 refills | Status: DC

## 2017-03-05 NOTE — Care Management Note (Signed)
Case Management Note  Patient Details  Name: [name] MRN: [id number] Date of Birth: [birth year]  Subjective/Objective:    Pt admitted with seizure. She is from home with spouse.                Action/Plan: Pt discharging home with orders for [facility] services. CM met with the patient and she was active with [facility] prior to admission and would like to continue with [facility].  Butch Penny with [facility] notified and aware of resumption orders.  Pt states she has supervision at home and transportation home.    Expected Discharge Date:  03/05/17               Expected Discharge Plan:  [place]  In-House Referral:     Discharge planning Services  CM Consult  Post Acute Care Choice:  Home Health, Resumption of Svcs/PTA Provider Choice offered to:  Patient  DME Arranged:    DME Agency:     HH Arranged:  PT, RN [place] Agency:  [place]  Status of Service:  Completed, signed off  If discussed at [place] of Stay Meetings, dates discussed:    Additional Comments:  Pollie Friar, RN 03/05/2017, 10:52 AM

## 2017-03-05 NOTE — Progress Notes (Signed)
Occupational Therapy Evaluation Patient Details Name: [name] MRN: [id number] DOB: December 22, 1965 Today's Date: 03/05/2017    History of Present Illness Pt is a 52 y/o female admitted after sustaining a fall secondary to having a seizure. All imagining was negative. PMH including but not limited epilepsy, CKD, L THA (1993, 1995, 2000), L TKA (2017), R TKA (2017).   Clinical Impression   Pt returning close to baseline level. Pt states her husband will be able to assist as needed after DC. Educated pt on home safety and reducing risk of falls. Pt verbalized understanding.     Follow Up Recommendations  No OT follow up;Supervision/Assistance - 24 hour(initially)    Equipment Recommendations  None recommended by OT    Recommendations for Other Services       Precautions / Restrictions Precautions Precautions: Other (comment)(seizures) Restrictions Weight Bearing Restrictions: No      Mobility Bed Mobility Overal bed mobility: Modified Independent                Transfers Overall transfer level: Needs assistance Equipment used: None Transfers: Sit to/from Stand Sit to Stand: Supervision              Balance Overall balance assessment: Needs assistance Sitting-balance support: Feet supported Sitting balance-Leahy Scale: Good     Standing balance support: During functional activity;No upper extremity supported Standing balance-Leahy Scale: Fair                             ADL either performed or assessed with clinical judgement   ADL Overall ADL's : Needs assistance/impaired                                     Functional mobility during ADLs: Min guard General ADL Comments: overall S for ADL; pt reports husband can assist; Educated on recommendation for using shower chair and she states she can use her husband's. Educated on recommendation to have husband with her when taking a shower. Pt veralized understanding. Educated on  reducing risk of falls.      Vision         Perception     Praxis      Pertinent Vitals/Pain Pain Assessment: 0-10 Pain Score: 5  Faces Pain Scale: Hurts little more Pain Location: headache Pain Descriptors / Indicators: Headache Pain Intervention(s): Patient requesting pain meds-RN notified     Hand Dominance Right   Extremity/Trunk Assessment Upper Extremity Assessment Upper Extremity Assessment: Overall WFL for tasks assessed   Lower Extremity Assessment Lower Extremity Assessment: Overall WFL for tasks assessed   Cervical / Trunk Assessment Cervical / Trunk Assessment: Normal   Communication Communication Communication: No difficulties   Cognition Arousal/Alertness: Awake/alert Behavior During Therapy: WFL for tasks assessed/performed Overall Cognitive Status: No family/caregiver present to determine baseline cognitive functioning                                 General Comments: most likely baseline   General Comments       Exercises     Shoulder Instructions      Home Living Family/patient expects to be discharged to:: Private residence Living Arrangements: Spouse/significant other Available Help at Discharge: Family;Available 24 hours/day Type of Home: House Home Access: Level entry     Home Layout: One level  Bathroom Shower/Tub: Teacher, early years/pre: Standard Bathroom Accessibility: Yes How Accessible: Accessible via walker Home Equipment: Nora - 2 wheels;Cane - single point;Bedside commode;Shower seat          Prior Functioning/Environment Level of Independence: Independent                 OT Problem List: Decreased activity tolerance;Pain      OT Treatment/Interventions:      OT Goals(Current goals can be found in the care plan section) Acute Rehab OT Goals Patient Stated Goal: return home OT Goal Formulation: All assessment and education complete, DC therapy  OT Frequency:      Barriers to D/C:            Co-evaluation              AM-PAC PT "6 Clicks" Daily Activity     Outcome Measure Help from another person eating meals?: None Help from another person taking care of personal grooming?: None Help from another person toileting, which includes using toliet, bedpan, or urinal?: A Little Help from another person bathing (including washing, rinsing, drying)?: A Little Help from another person to put on and taking off regular upper body clothing?: None Help from another person to put on and taking off regular lower body clothing?: None 6 Click Score: 22   End of Session Nurse Communication: Patient requests pain meds  Activity Tolerance: Patient tolerated treatment well Patient left: in bed;with call bell/phone within reach  OT Visit Diagnosis: Unsteadiness on feet (R26.81);Pain Pain - part of body: (head)                Time: 4827-0786 OT Time Calculation (min): 14 min Charges:  OT General Charges $OT Visit: 1 Visit OT Evaluation $OT Eval Low Complexity: 1 Low G-Codes:     Amirr Achord, OT/L  [phone] 03/05/2017  Mischa Pollard,HILLARY 03/05/2017, 11:31 AM

## 2017-03-05 NOTE — Progress Notes (Signed)
Discharge instruction reviewed with patient/family. All questions answered at this time. Awaiting for transport from family.   Ave Filter, RN

## 2017-03-08 NOTE — Discharge Summary (Signed)
[facility] Discharge Summary   Patient: [name] TIR:[id number]   PCP: [name], MD DOB: 1965/05/06   Date of admission: 03/03/2017   Date of discharge: 03/05/2017     Discharge Diagnoses:  Active Problems:   Seizure ([place])   Admitted From: home Disposition:  home  Recommendations for Outpatient Follow-up:  1. Please follow up with PCP in 1 week   Follow-up Information    [name], MD. Schedule an appointment as soon as possible for a visit in 1 week(s).   Specialty:  Family Medicine Contact information: [address] [place] [address] [phone]          Diet recommendation: cardiac diet  Activity: The patient is advised to gradually reintroduce usual activities.  Discharge Condition: good  Code Status: full code  History of present illness: As per the H and P dictated on admission, "[name] is a 52 y.o. female with Past medical history of chronic bronchitis, chronic kidney disease stage III, HLD, seizure disorder. Patient presented with episodes of unresponsiveness. History is primarily taken from chart review.  Per patient's daughter patient started having shaking episodes like seizures today during the morning.  Later on she also had a fall because of similar episode. EMS was called initially but patient did not wanted to be transported to ER. Patient continued to have seizure-like episodes with confusion and therefore was brought again to the hospital."  Hospital Course:  Summary of her active problems in the hospital is as following. 1. Seizure disorder. Status epilepticus. Patient appears awake and oriented. Received IV Ativan. Also loaded with IV Keppra again. Neurology was consulted, appreciate input. She was in 1500 mg of Keppra twice daily had excessive sleepiness and was reduced to 500 mg twice daily before presentation. Currently dose increased back again to 1 g twice daily and monitor as  outpatient.  2.Fall. Presented with a fall. X-ray knee x-ray shoulder unremarkable. CT had unremarkable for any acute fracture as well. Chest x-ray normal as well. PT OT consulted, recommended home health.  3.Depression and fibromyalgia. Continue home regimen  4.  Hypokalemia. Replaced   All other chronic medical condition were stable during the hospitalization.  Patient was seen by physical therapy, who recommended home health, which was arranged by Education officer, museum and case Freight forwarder. On the day of the discharge the patient's vitals were stable, and no other acute medical condition were reported by patient. the patient was felt safe to be discharge at home with home health.  Procedures and Results:  none  Consultations:  Neurology  DISCHARGE MEDICATION: Allergies as of 03/05/2017      Reactions   Acetaminophen Other (See Comments)   Has fatty deposits on liver   Benadryl [diphenhydramine Hcl] Other (See Comments)   hyperactivity and seizures   Codeine Itching, Rash   seizures   Dilantin [phenytoin Sodium Extended] Other (See Comments)   Elevated LFT's   Melatonin Other (See Comments)   seizures   Ultram [tramadol Hcl] Other (See Comments)   Seizures   Vimpat [lacosamide] Other (See Comments)   Severe dizziness   Betadine [povidone Iodine] Rash   Latex Rash   Penicillins Itching, Rash   Has patient had a PCN reaction causing immediate rash, facial/tongue/throat swelling, SOB or lightheadedness with hypotension: No Has patient had a PCN reaction causing severe rash involving mucus membranes or skin necrosis: No Has patient had a PCN reaction that required hospitalization No Has patient had a PCN reaction occurring within  the last 10 years: No If all of the above answers are "NO", then may proceed with Cephalosporin use.   Sulfa Antibiotics Rash   Tape Rash   Paper tape please   Vicodin [hydrocodone-acetaminophen] Itching      Medication List    STOP taking  these medications   amoxicillin-clavulanate 875-125 MG tablet Commonly known as:  AUGMENTIN     TAKE these medications   clonazePAM 0.5 MG tablet Commonly known as:  KLONOPIN TAKE 1 TABLET BY MOUTH ONCE DAILY AS NEEDED What changed:    how much to take  how to take this  when to take this   FLUoxetine HCl 60 MG Tabs Take 60 mg by mouth daily.   gabapentin 600 MG tablet Commonly known as:  NEURONTIN Take 1 tablet (600 mg total) by mouth 3 (three) times daily.   lamoTRIgine 150 MG tablet Commonly known as:  LAMICTAL Take 1 tablet (150 mg total) by mouth 2 (two) times daily.   levETIRAcetam 1000 MG tablet Commonly known as:  KEPPRA Take 1 tablet (1,000 mg total) by mouth 2 (two) times daily. What changed:    medication strength  how much to take   mirtazapine 7.5 MG tablet Commonly known as:  REMERON Take 1 tablet (7.5 mg total) by mouth at bedtime.   mupirocin ointment 2 % Commonly known as:  BACTROBAN Place 1 application into the nose 2 (two) times daily.   oxyCODONE 5 MG immediate release tablet Commonly known as:  Oxy IR/ROXICODONE Take 1 tablet (5 mg total) by mouth every 4 (four) hours as needed for severe pain.   pramipexole 0.5 MG tablet Commonly known as:  MIRAPEX Take 1 tablet (0.5 mg total) by mouth 2 (two) times daily.   RAPAFLO 4 MG Caps capsule Generic drug:  silodosin Take 4 mg by mouth daily.   VITAMIN B 12 PO Take 1 tablet by mouth daily.      Allergies  Allergen Reactions  . Acetaminophen Other (See Comments)    Has fatty deposits on liver  . Benadryl [Diphenhydramine Hcl] Other (See Comments)    hyperactivity and seizures  . Codeine Itching and Rash    seizures  . Dilantin [Phenytoin Sodium Extended] Other (See Comments)    Elevated LFT's  . Melatonin Other (See Comments)    seizures  . Ultram [Tramadol Hcl] Other (See Comments)    Seizures  . Vimpat [Lacosamide] Other (See Comments)    Severe dizziness  . Betadine  [Povidone Iodine] Rash  . Latex Rash  . Penicillins Itching and Rash    Has patient had a PCN reaction causing immediate rash, facial/tongue/throat swelling, SOB or lightheadedness with hypotension: No Has patient had a PCN reaction causing severe rash involving mucus membranes or skin necrosis: No Has patient had a PCN reaction that required hospitalization No Has patient had a PCN reaction occurring within the last 10 years: No If all of the above answers are "NO", then may proceed with Cephalosporin use.  . Sulfa Antibiotics Rash  . Tape Rash    Paper tape please  . Vicodin [Hydrocodone-Acetaminophen] Itching   Discharge Instructions    Diet - low sodium heart healthy   Complete by:  As directed    Discharge instructions   Complete by:  As directed    It is important that you read following instructions as well as go over your medication list with RN to help you understand your care after this hospitalization.  Discharge Instructions: Please  follow-up with PCP in one week  Please request your primary care physician to go over all Hospital Tests and Procedure/Radiological results at the follow up,  Please get all Hospital records sent to your PCP by signing hospital release before you go home.   Do not drive, operating heavy machinery, perform activities at heights, swimming or participation in water activities or provide baby sitting services; until you have been seen by Primary Care Physician or a Neurologist and advised to do so again. Do not take more than prescribed Pain, Sleep and Anxiety Medications. You were cared for by a hospitalist during your hospital stay. If you have any questions about your discharge medications or the care you received while you were in the hospital after you are discharged, you can call the unit and ask to speak with the hospitalist on call if the hospitalist that took care of you is not available.  Once you are discharged, your primary care physician  will handle any further medical issues. Please note that NO REFILLS for any discharge medications will be authorized once you are discharged, as it is imperative that you return to your primary care physician (or establish a relationship with a primary care physician if you do not have one) for your aftercare needs so that they can reassess your need for medications and monitor your lab values. You Must read complete instructions/literature along with all the possible adverse reactions/side effects for all the Medicines you take and that have been prescribed to you. Take any new Medicines after you have completely understood and accept all the possible adverse reactions/side effects. Wear Seat belts while driving. If you have smoked or chewed Tobacco in the last 2 yrs please stop smoking and/or stop any Recreational drug use.   Driving Restrictions   Complete by:  As directed    Do not drive   Increase activity slowly   Complete by:  As directed      Discharge Exam: Filed Weights   03/03/17 2323 03/04/17 0500 03/05/17 0500  Weight: 69.7 kg (153 lb 10.6 oz) 69.6 kg (153 lb 7 oz) 69.6 kg (153 lb 7 oz)   Vitals:   03/05/17 0424 03/05/17 0800  BP: 95/65 108/70  Pulse: (!) 57 66  Resp: 20 18  Temp: 98.1 F (36.7 C) 98.4 F (36.9 C)  SpO2: 98% 99%   General: Appear in no distress, no Rash; Oral Mucosa moist. Cardiovascular: S1 and S2 Present, no Murmur, no JVD Respiratory: Bilateral Air entry present and Clear to Auscultation, no Crackles, no wheezes Abdomen: Bowel Sound present, Soft and no tenderness Extremities: no Pedal edema, no calf tenderness Neurology: Grossly no focal neuro deficit.  The results of significant diagnostics from this hospitalization (including imaging, microbiology, ancillary and laboratory) are listed below for reference.    Significant Diagnostic Studies: Dg Shoulder Right  Result Date: 03/03/2017 CLINICAL DATA:  Right shoulder pain after seizure. EXAM:  RIGHT SHOULDER - 2+ VIEW COMPARISON:  11/14/2014 FINDINGS: There is no evidence of fracture or dislocation. There is no evidence of arthropathy or other focal bone abnormality. Soft tissues are unremarkable. IMPRESSION: Negative for fracture or joint dislocations. Electronically Signed   By: Ashley Royalty M.D.   On: 03/03/2017 18:47   Dg Knee 2 Views Right  Result Date: 03/03/2017 CLINICAL DATA:  Right knee pain after seizure. EXAM: RIGHT KNEE - 1-2 VIEW COMPARISON:  None. 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 No hardware loosening or failure. No fracture. No  joint effusion nor joint dislocations. Unremarkable soft tissues. IMPRESSION: Intact right total knee arthroplasty.  No acute osseous abnormality. Electronically Signed   By: Ashley Royalty M.D.   On: 03/03/2017 18:48   Ct Head Wo Contrast  Result Date: 03/03/2017 CLINICAL DATA:  Status post fall with witnessed seizures today. EXAM: CT HEAD WITHOUT CONTRAST TECHNIQUE: Contiguous axial images were obtained from the base of the skull through the vertex without intravenous contrast. COMPARISON:  February 26, 2017 FINDINGS: Brain: No evidence of acute infarction, hemorrhage, hydrocephalus, extra-axial collection or mass lesion/mass effect. Vascular: No hyperdense vessel or unexpected calcification. Skull: Normal. Negative for fracture or focal lesion. Sinuses/Orbits: No acute finding. Other: None. IMPRESSION: No focal acute intracranial abnormality identified. Electronically Signed   By: Abelardo Diesel M.D.   On: 03/03/2017 19:03   Dg Chest Portable 1 View  Result Date: 03/03/2017 CLINICAL DATA:  Seizure.  No chest complaints. EXAM: PORTABLE CHEST 1 VIEW COMPARISON:  Chest x-ray dated February 27, 2017. FINDINGS: The heart size and mediastinal contours are within normal limits. Both lungs are clear. The visualized skeletal structures are unremarkable. IMPRESSION: No active disease. Electronically Signed   By: Titus Dubin M.D.   On: 03/03/2017 20:00    Microbiology: No results found for this or any previous visit (from the past 240 hour(s)).   Labs: CBC: Recent Labs  Lab 03/03/17 1742 03/04/17 0513  WBC 7.3 7.4  NEUTROABS 4.9  --   HGB 13.7 12.3  HCT 40.9 37.5  MCV 88.5 88.2  PLT 294 160   Basic Metabolic Panel: Recent Labs  Lab 03/03/17 1742 03/04/17 0513 03/05/17 0401  NA 141 143 142  K 4.9 3.3* 4.1  CL 106 106 106  CO2 21* 25 26  GLUCOSE 96 83 83  BUN 7 6 8   CREATININE 0.76 0.71 0.80  CALCIUM 9.5 9.0 9.1   Liver Function Tests: Recent Labs  Lab 03/03/17 1742 03/04/17 0513  AST 63* 35  ALT 45 34  ALKPHOS 64 62  BILITOT 0.9 0.6  PROT 7.4 6.6  ALBUMIN 4.3 3.6   No results for input(s): LIPASE, AMYLASE in the last 168 hours. No results for input(s): AMMONIA in the last 168 hours. Cardiac Enzymes: No results for input(s): CKTOTAL, CKMB, CKMBINDEX, TROPONINI in the last 168 hours. BNP (last 3 results) No results for input(s): BNP in the last 8760 hours. CBG: Recent Labs  Lab 03/03/17 1736  GLUCAP 90   Time spent: 35 minutes  Signed:  Berle Mull  [facility] 03/05/2017 , 7:17 PM

## 2017-03-09 NOTE — Telephone Encounter (Signed)
Patient was just released from hospital 02/21

## 2017-03-31 ENCOUNTER — Ambulatory Visit: Payer: PPO | Admitting: Family Medicine

## 2017-03-31 ENCOUNTER — Telehealth: Payer: Self-pay | Admitting: Family Medicine

## 2017-04-01 ENCOUNTER — Ambulatory Visit: Payer: PPO | Admitting: *Deleted

## 2017-04-02 ENCOUNTER — Encounter: Payer: Self-pay | Admitting: Family Medicine

## 2017-04-06 ENCOUNTER — Other Ambulatory Visit: Payer: Self-pay | Admitting: *Deleted

## 2017-04-06 MED ORDER — LAMOTRIGINE 150 MG PO TABS: 150.0000 mg | ORAL_TABLET | Freq: Two times a day (BID) | ORAL | 0 refills | Status: DC

## 2017-04-06 MED ORDER — LEVETIRACETAM 1000 MG PO TABS: 1000.0000 mg | ORAL_TABLET | Freq: Two times a day (BID) | ORAL | 0 refills | Status: DC

## 2017-04-12 ENCOUNTER — Other Ambulatory Visit: Payer: Self-pay | Admitting: Neurology

## 2017-04-13 ENCOUNTER — Ambulatory Visit: Payer: PPO | Admitting: Family Medicine

## 2017-04-13 ENCOUNTER — Ambulatory Visit (INDEPENDENT_AMBULATORY_CARE_PROVIDER_SITE_OTHER): Payer: PPO | Admitting: Family

## 2017-04-13 ENCOUNTER — Encounter: Payer: Self-pay | Admitting: Family

## 2017-04-13 ENCOUNTER — Telehealth: Payer: Self-pay | Admitting: *Deleted

## 2017-04-13 VITALS — BP 124/84 | HR 88 | Temp 98.3°F | Ht 60.0 in | Wt 162.6 lb

## 2017-04-13 DIAGNOSIS — J301 Allergic rhinitis due to pollen: Secondary | ICD-10-CM

## 2017-04-13 DIAGNOSIS — H6690 Otitis media, unspecified, unspecified ear: Secondary | ICD-10-CM | POA: Diagnosis not present

## 2017-04-13 MED ORDER — CEFDINIR 300 MG PO CAPS: 300.0000 mg | ORAL_CAPSULE | Freq: Two times a day (BID) | ORAL | 0 refills | Status: DC

## 2017-04-13 NOTE — Telephone Encounter (Signed)
Patient requesting a refill on Klonipin medication.  Please review and advise.

## 2017-04-13 NOTE — Progress Notes (Signed)
   Subjective:    Patient ID: [name], female    DOB: [birth year], 52 y.o.   MRN: [id number]  Otalgia   There is pain in the left ear. This is a new problem. The current episode started 1 to 4 weeks ago. The problem has been gradually worsening. There has been no fever. The pain is at a severity of 4/10. The pain is mild. Associated symptoms include headaches. Pertinent negatives include no coughing, ear discharge, rhinorrhea or sore throat. She has tried nothing for the symptoms. The treatment provided no relief.      Review of Systems  HENT: Positive for ear pain. Negative for ear discharge, rhinorrhea and sore throat.   Respiratory: Negative for cough.   Neurological: Positive for headaches.  All other systems reviewed and are negative.      Objective:   Physical Exam  Constitutional: She is oriented to person, place, and time. She appears well-developed and well-nourished. No distress.  HENT:  Head: Normocephalic and atraumatic.  Right Ear: External ear normal. Tympanic membrane is erythematous.  Left Ear: Tympanic membrane is bulging.  Mouth/Throat: Oropharynx is clear and moist.  Eyes: Pupils are equal, round, and reactive to light.  Neck: Normal range of motion. Neck supple. No thyromegaly present.  Cardiovascular: Normal rate, regular rhythm, normal heart sounds and intact distal pulses.  No murmur heard. Pulmonary/Chest: Effort normal and breath sounds normal. No respiratory distress. She has no wheezes.  Abdominal: Soft. Bowel sounds are normal. She exhibits no distension. There is no tenderness.  Musculoskeletal: Normal range of motion. She exhibits no edema or tenderness.  Neurological: She is alert and oriented to person, place, and time. She has normal reflexes. No cranial nerve deficit.  Skin: Skin is warm and dry.  Psychiatric: She has a normal mood and affect. Her behavior is normal. Judgment and thought content normal.  Vitals reviewed.     BP 124/84    Pulse 88   Temp 98.3 F (36.8 C) (Oral)   Ht 5' (1.524 m)   Wt 162 lb 9.6 oz (73.8 kg)   BMI 31.76 kg/m      Assessment & Plan:  1. Acute otitis media, unspecified otitis media type Tylenol prn  Start flonase and Claritin  RTO prn  - cefdinir (OMNICEF) 300 MG capsule; Take 1 capsule (300 mg total) by mouth 2 (two) times daily. 1 po BID  Dispense: 14 capsule; Refill: 0  2. Allergic rhinitis due to pollen, unspecified seasonality Start flonase and Claritin     Evelina Dun, [place]

## 2017-04-13 NOTE — Telephone Encounter (Signed)
Pt. Needs to be seen for this. Thanks, WS 

## 2017-04-13 NOTE — Patient Instructions (Signed)
In a few days you may receive a survey in the mail or online from [employer] regarding your visit with [name] today. Please take a moment to fill this out. Your feedback is very important to our whole office. It can help [name] better understand your needs as well as improve your experience and satisfaction. Thank you for taking your time to complete it. We care about you.  Evelina Dun, FNP   Otitis Media, Adult Otitis media occurs when there is inflammation and fluid in the middle ear. Your middle ear is a part of the ear that contains bones for hearing as well as air that helps send sounds to your brain. What are the causes? This condition is caused by a blockage in the eustachian tube. This tube drains fluid from the ear to the back of the nose (nasopharynx). A blockage in this tube can be caused by an object or by swelling (edema) in the tube. Problems that can cause a blockage include:  A cold or other upper respiratory infection.  Allergies.  An irritant, such as tobacco smoke.  Enlarged adenoids. The adenoids are areas of soft tissue located high in the back of the throat, behind the nose and the roof of the mouth.  A mass in the nasopharynx.  Damage to the ear caused by pressure changes (barotrauma).  What are the signs or symptoms? Symptoms of this condition include:  Ear pain.  A fever.  Decreased hearing.  A headache.  Tiredness (lethargy).  Fluid leaking from the ear.  Ringing in the ear.  How is this diagnosed? This condition is diagnosed with a physical exam. During the exam your health care provider will use an instrument called an otoscope to look into your ear and check for redness, swelling, and fluid. He or she will also ask about your symptoms. Your health care provider may also order tests, such as:  A test to check the movement of the eardrum (pneumatic otoscopy). This test is done by squeezing a small amount of air into the ear.  A test that changes air  pressure in the middle ear to check how well the eardrum moves and whether the eustachian tube is working (tympanogram).  How is this treated? This condition usually goes away on its own within 3-5 days. But if the condition is caused by a bacteria infection and does not go away own its own, or keeps coming back, your health care provider may:  Prescribe antibiotic medicines to treat the infection.  Prescribe or recommend medicines to control pain.  Follow these instructions at home:  Take over-the-counter and prescription medicines only as told by your health care provider.  If you were prescribed an antibiotic medicine, take it as told by your health care provider. Do not stop taking the antibiotic even if you start to feel better.  Keep all follow-up visits as told by your health care provider. This is important. Contact a health care provider if:  You have bleeding from your nose.  There is a lump on your neck.  You are not getting better in 5 days.  You feel worse instead of better. Get help right away if:  You have severe pain that is not controlled with medicine.  You have swelling, redness, or pain around your ear.  You have stiffness in your neck.  A part of your face is paralyzed.  The bone behind your ear (mastoid) is tender when you touch it.  You develop a severe headache.  Summary  Otitis media is redness, soreness, and swelling of the middle ear.  This condition usually goes away on its own within 3-5 days.  If the problem does not go away in 3-5 days, your health care provider may prescribe or recommend medicines to treat your symptoms.  If you were prescribed an antibiotic medicine, take it as told by your health care provider. This information is not intended to replace advice given to you by your health care provider. Make sure you discuss any questions you have with your health care provider. Document Released: 10/05/2003 Document Revised: 12/21/2015  Document Reviewed: 12/21/2015 Elsevier Interactive Patient Education  [employer].

## 2017-04-14 NOTE — Telephone Encounter (Signed)
LM for pt to call back for appt with [facility]

## 2017-04-15 ENCOUNTER — Encounter: Payer: Self-pay | Admitting: Adult Health

## 2017-04-15 ENCOUNTER — Ambulatory Visit (INDEPENDENT_AMBULATORY_CARE_PROVIDER_SITE_OTHER): Payer: PPO | Admitting: Adult Health

## 2017-04-15 ENCOUNTER — Encounter: Payer: Self-pay | Admitting: Family Medicine

## 2017-04-15 VITALS — BP 121/83 | HR 80 | Ht 60.0 in | Wt 159.4 lb

## 2017-04-15 DIAGNOSIS — F445 Conversion disorder with seizures or convulsions: Secondary | ICD-10-CM | POA: Diagnosis not present

## 2017-04-15 DIAGNOSIS — Z5181 Encounter for therapeutic drug level monitoring: Secondary | ICD-10-CM | POA: Diagnosis not present

## 2017-04-15 DIAGNOSIS — G40919 Epilepsy, unspecified, intractable, without status epilepticus: Secondary | ICD-10-CM | POA: Diagnosis not present

## 2017-04-15 MED ORDER — LEVETIRACETAM 1000 MG PO TABS: ORAL_TABLET | ORAL | 5 refills | Status: DC

## 2017-04-15 NOTE — Progress Notes (Signed)
PATIENT: [name] DOB: [birth year]  REASON FOR VISIT: follow up HISTORY FROM: patient  HISTORY OF PRESENT ILLNESS: Today 04/15/17 [name] is a 52 year old female with a history of intractable seizures as well as pseudoseizures.  She returns today for follow-up.  She is currently on Keppra thousand milligrams twice a day.  After reading through hospital admission this was decreased last year due to somnolence.  The patient was in the hospital earlier this year for seizures and Lamictal was increased to 150 mg twice a day.   She reports that she has not noticed a decrease in her seizures With the increase in Lamictal.  She does report that when Keppra was decreased her seizure frequency increase.  She has a family member with her today.  The family member reports that there is a difference between her seizures.  She states typically when she is under a lot of stress if she has a seizure they feel that it is a Pseudoseizures.  The family member reports that her seizures are about 50-50.  The patient reports that she had a seizure yesterday.  Reports that her husband said that she was convulsing in the arms and legs.  And she bit her tongue.  Patient reports that she was very lethargic the rest of the day.  Her family member feels that this may not have been a true seizure event but rather a pseudoseizure.  The patient does acknowledge that she is under a lot of stress.  Her husband will be going to [facility] for possible lung transplant and she has a son that is back in present.  The patient does see a psychiatrist regularly.Patient returns today for an evaluation.   HISTORY 09/25/15: [name] is a 52 year old female with a history of intractable seizures. She returns today for follow-up. She is currently taking Kepprathousand milligrams in the morning and 1500 mg in the evening. She is also on Lamictal 100 mg twice a day. She reports that she had a seizure September 4. She states that she lost  consciousness and was jerking and in all 4 extremities. She denies missing any medication. Denies starting any new medication. Denies any changes with her medication. She reports that her primary care recently switched her to Lyrica she states this occurred after she had a seizure. She returns today for an evaluation.    REVIEW OF SYSTEMS: Out of a complete 14 system review of symptoms, the patient complains only of the following symptoms, and all other reviewed systems are negative.  Appetite change, activity change, ear pain, constipation, excessive thirst, joint pain, joint swelling, back pain, aching muscles, muscle cramps, walking difficulty, snoring, agitation, confusion, depression, nervous/anxious, tremors, seizure, headache, memory loss, difficulty urinating  ALLERGIES: Allergies  Allergen Reactions  . Acetaminophen Other (See Comments)    Has fatty deposits on liver  . Benadryl [Diphenhydramine Hcl] Other (See Comments)    hyperactivity and seizures  . Codeine Itching and Rash    seizures  . Dilantin [Phenytoin Sodium Extended] Other (See Comments)    Elevated LFT's  . Melatonin Other (See Comments)    seizures  . Ultram [Tramadol Hcl] Other (See Comments)    Seizures  . Vimpat [Lacosamide] Other (See Comments)    Severe dizziness  . Betadine [Povidone Iodine] Rash  . Latex Rash  . Penicillins Itching and Rash    Has patient had a PCN reaction causing immediate rash, facial/tongue/throat swelling, SOB or lightheadedness with hypotension: No Has patient had  a PCN reaction causing severe rash involving mucus membranes or skin necrosis: No Has patient had a PCN reaction that required hospitalization No Has patient had a PCN reaction occurring within the last 10 years: No If all of the above answers are "NO", then may proceed with Cephalosporin use.  . Sulfa Antibiotics Rash  . Tape Rash    Paper tape please  . Vicodin [Hydrocodone-Acetaminophen] Itching    HOME  MEDICATIONS: Outpatient Medications Prior to Visit  Medication Sig Dispense Refill  . cefdinir (OMNICEF) 300 MG capsule Take 1 capsule (300 mg total) by mouth 2 (two) times daily. 1 po BID 14 capsule 0  . clonazePAM (KLONOPIN) 0.5 MG tablet TAKE 1 TABLET BY MOUTH ONCE DAILY AS NEEDED (Patient taking differently: TAKE 5.0 mg TABLET BY MOUTH ONCE DAILY AS NEEDED) 30 tablet 2  . Cyanocobalamin (VITAMIN B 12 PO) Take 1 tablet by mouth daily.    Marland Kitchen FLUoxetine HCl 60 MG TABS Take 60 mg by mouth daily. 90 tablet 0  . gabapentin (NEURONTIN) 600 MG tablet Take 1 tablet (600 mg total) by mouth 3 (three) times daily. 90 tablet 5  . lamoTRIgine (LAMICTAL) 100 MG tablet TAKE 1 TABLET BY MOUTH TWICE DAILY 60 tablet 0  . lamoTRIgine (LAMICTAL) 150 MG tablet Take 1 tablet (150 mg total) by mouth 2 (two) times daily. 60 tablet 0  . levETIRAcetam (KEPPRA) 1000 MG tablet Take 1 tablet (1,000 mg total) by mouth 2 (two) times daily. 60 tablet 0  . mirtazapine (REMERON) 7.5 MG tablet Take 1 tablet (7.5 mg total) by mouth at bedtime. 90 tablet 0  . mupirocin ointment (BACTROBAN) 2 % Place 1 application into the nose 2 (two) times daily. 22 g 0  . oxyCODONE (OXY IR/ROXICODONE) 5 MG immediate release tablet Take 1 tablet (5 mg total) by mouth every 4 (four) hours as needed for severe pain. 12 tablet 0  . pramipexole (MIRAPEX) 0.5 MG tablet Take 1 tablet (0.5 mg total) by mouth 2 (two) times daily. 60 tablet 5  . RAPAFLO 4 MG CAPS capsule Take 4 mg by mouth daily.     No facility-administered medications prior to visit.     PAST MEDICAL HISTORY: Past Medical History:  Diagnosis Date  . Arthritis    "knees" (04/22/2012)  . Bronchitis   . Chronic bronchitis ([place])    "yearly; when the weather changes" (04/22/2012)  . Chronic kidney disease    kidney stones  . Colon polyps    adenomatous and hyperplastic-  . Constipation   . Depression   . Eczema   . Epilepsy ([place])    "been having them right often here lately"  (04/22/2012)  . Fatty liver   . ZOXWRUEA(540.9)    "1/wk" (04/22/2012)  . High cholesterol   . History of kidney stones   . Osteoarthritis    [name] 04/22/2012  . Other convulsions 05/21/12   non-epileptic spells  . Rectal bleed    in toilet- bright red  . Restless leg   . Seizures ([place])   . Vertigo     PAST SURGICAL HISTORY: Past Surgical History:  Procedure Laterality Date  . ABDOMINAL HYSTERECTOMY  2001  . BLADDER SUSPENSION    . BUNIONECTOMY Left 2000  . CESAREAN SECTION  1987; 1988  . COLONOSCOPY    . JOINT REPLACEMENT    . MASS EXCISION  10/22/2011   Procedure: EXCISION MASS;  Surgeon: [name], MD;  Location: [facility];  Service: General;  Laterality:  Right;  excision right buttock mass  . TOTAL HIP ARTHROPLASTY Left 1993; 1995; 2000  . TOTAL KNEE ARTHROPLASTY Left 05/07/2015   Procedure: TOTAL LEFT KNEE ARTHROPLASTY;  Surgeon: [name], MD;  Location: [facility];  Service: Orthopedics;  Laterality: Left;  . TOTAL KNEE ARTHROPLASTY Right 10/01/2015   Procedure: RIGHT TOTAL KNEE ARTHROPLASTY;  Surgeon: [name], MD;  Location: [facility];  Service: Orthopedics;  Laterality: Right;    FAMILY HISTORY: Family History  Problem Relation Age of Onset  . Cancer Mother   . Depression Father   . Cancer Brother   . Diabetes Brother   . Cancer Maternal Aunt   . Diabetes Maternal Aunt   . Bipolar disorder Son   . Drug abuse Son     SOCIAL HISTORY: Social History   Socioeconomic History  . Marital status: Married    Spouse name: [name]  . Number of children: 2  . Years of education: 12 +  . Highest education level: Not on file  Occupational History  . Occupation: unemployed    Fish farm manager: OTHER  Social Needs  . Financial resource strain: Not on file  . Food insecurity:    Worry: Not on file    Inability: Not on file  . Transportation needs:    Medical: Not on file    Non-medical: Not on file  Tobacco Use  . Smoking status: Never Smoker  . Smokeless  tobacco: Never Used  Substance and Sexual Activity  . Alcohol use: No    Alcohol/week: 0.0 oz    Comment: 12-25-2015 per pt no  . Drug use: No    Comment: 21-12-2015 per pt no   . Sexual activity: Not Currently    Birth control/protection: Surgical  Lifestyle  . Physical activity:    Days per week: Not on file    Minutes per session: Not on file  . Stress: Not on file  Relationships  . Social connections:    Talks on phone: Not on file    Gets together: Not on file    Attends religious service: Not on file    Active member of club or organization: Not on file    Attends meetings of clubs or organizations: Not on file    Relationship status: Not on file  . Intimate partner violence:    Fear of current or ex partner: Not on file    Emotionally abused: Not on file    Physically abused: Not on file    Forced sexual activity: Not on file  Other Topics Concern  . Not on file  Social History Narrative   Patient is married [name]) and lives at home with her husband.   Patient has two adult children.   Patient is disabled.   Patient has a high school education and some trade school.   Patient is right-handed.   Patient drinks four glasses of soda and tea daily.      PHYSICAL EXAM  Vitals:   04/15/17 1000  BP: 121/83  Pulse: 80  Weight: 159 lb 6.4 oz (72.3 kg)  Height: 5' (1.524 m)   Body mass index is 31.13 kg/m.  Generalized: Well developed, in no acute distress   Neurological examination  Mentation: Alert oriented to time, place, history taking. Follows all commands speech and language fluent Cranial nerve II-XII: Pupils were equal round reactive to light. Extraocular movements were full, visual field were full on confrontational test. Facial sensation and strength were normal. Uvula tongue midline. Head turning  and shoulder shrug  were normal and symmetric. Motor: The motor testing reveals 5 over 5 strength of all 4 extremities. Good symmetric motor tone is noted  throughout.  Sensory: Sensory testing is intact to soft touch on all 4 extremities. No evidence of extinction is noted.  Coordination: Cerebellar testing reveals good finger-nose-finger and heel-to-shin bilaterally.  Gait and station: Gait is normal. Tandem gait is normal. Romberg is negative. No drift is seen.  Reflexes: Deep tendon reflexes are symmetric and normal bilaterally.   DIAGNOSTIC DATA (LABS, IMAGING, TESTING) - I reviewed patient records, labs, notes, testing and imaging myself where available.  Lab Results  Component Value Date   WBC 7.4 03/04/2017   HGB 12.3 03/04/2017   HCT 37.5 03/04/2017   MCV 88.2 03/04/2017   PLT 289 03/04/2017      Component Value Date/Time   NA 142 03/05/2017 0401   NA 142 02/03/2017 1728   K 4.1 03/05/2017 0401   CL 106 03/05/2017 0401   CO2 26 03/05/2017 0401   GLUCOSE 83 03/05/2017 0401   BUN 8 03/05/2017 0401   BUN 10 02/03/2017 1728   CREATININE 0.80 03/05/2017 0401   CALCIUM 9.1 03/05/2017 0401   PROT 6.6 03/04/2017 0513   PROT 6.8 02/03/2017 1728   ALBUMIN 3.6 03/04/2017 0513   ALBUMIN 4.4 02/03/2017 1728   AST 35 03/04/2017 0513   ALT 34 03/04/2017 0513   ALKPHOS 62 03/04/2017 0513   BILITOT 0.6 03/04/2017 0513   BILITOT 0.4 02/03/2017 1728   GFRNONAA >60 03/05/2017 0401   GFRAA >60 03/05/2017 0401   Lab Results  Component Value Date   CHOL 164 07/07/2016   HDL 53 07/07/2016   LDLCALC 86 07/07/2016   TRIG 124 07/07/2016   CHOLHDL 3.1 07/07/2016   Lab Results  Component Value Date   HGBA1C 5.4 11/28/2015   Lab Results  Component Value Date   VITAMINB12 515 06/24/2011   Lab Results  Component Value Date   TSH 1.900 07/07/2016      ASSESSMENT AND PLAN 52 y.o. year old female  has a past medical history of Arthritis, Bronchitis, Chronic bronchitis ([place]), Chronic kidney disease, Colon polyps, Constipation, Depression, Eczema, Epilepsy ([place]), Fatty liver, Headache(784.0), High cholesterol, History of kidney  stones, Osteoarthritis, Other convulsions (05/21/12), Rectal bleed, Restless leg, Seizures ([place]), and Vertigo. here with:  1.  Seizures 2.  Pseudoseizures  The patient continues to have seizure events.  It is unclear whether these are always epileptic events or pseudoseizures.  I will try increasing Keppra to thousand milligrams in the morning and 1500 mg in the evening.  If this causes excessive drowsiness she will let us know.  She will remain on Lamictal 150 mg twice a day.  I will check blood work today.  The patient will be referred to [facility] for possible EMU admission.  The patient's last EMU admit was in 2004 and it demonstrated both epileptic events as well as pseudoseizures.  I will send the patient for reevaluation.  Patient is amenable to this plan.  She will call if her symptoms worsen.  I spent 25 minutes with the patient. 50% of this time was spent Discussing plan of care   [name], MSN, NP-C 04/15/2017, 9:47 AM [facility] [address], [place], [place] [address] [phone]

## 2017-04-15 NOTE — Patient Instructions (Signed)
Your Plan:  Continue Lamictal 150 mg twice a day Increase Keppra 1 tablet (1000mg  ) in the morning and 1/5 tablets at bedtime (1500) If you experience drowsiness with the increase let us know [facility] If your symptoms worsen or you develop new symptoms please let us know.   Thank you for coming to see [name] at [facility]. I hope we have been able to provide you high quality care today.  You may receive a patient satisfaction survey over the next few weeks. We would appreciate your feedback and comments so that we may continue to improve ourselves and the health of our patients.

## 2017-04-16 DIAGNOSIS — H40003 Preglaucoma, unspecified, bilateral: Secondary | ICD-10-CM | POA: Diagnosis not present

## 2017-04-17 ENCOUNTER — Encounter: Payer: Self-pay | Admitting: Family Medicine

## 2017-04-17 ENCOUNTER — Ambulatory Visit (INDEPENDENT_AMBULATORY_CARE_PROVIDER_SITE_OTHER): Payer: PPO | Admitting: Family Medicine

## 2017-04-17 VITALS — BP 111/84 | HR 95 | Ht 60.0 in | Wt 160.5 lb

## 2017-04-17 DIAGNOSIS — I469 Cardiac arrest, cause unspecified: Secondary | ICD-10-CM | POA: Diagnosis not present

## 2017-04-17 NOTE — Progress Notes (Signed)
Subjective:  Patient ID: [name], female    DOB: June 16, 1965  Age: 52 y.o. MRN: [id number]  CC: Hospitalization Follow-up (pt here today for follow up after being admitted to hospital after her "heart stopped" on 2/14)   HPI KARLENE SOUTHARD presents for hospital follow-up for cardiac arrest.  On February 14 she experienced cardiac arrest in the bank inside [place].  She underwent CPR and was transferred to the hospital at [facility], [place].  She has a history of allergy to tramadol and hydrocodone.  She had been prescribed oxycodone for kidney stones in January and tolerated it well but 6 weeks later apparently she took an oxycodone and experienced rest.  Now she is concerned that the oxycodone caused the arrest and that it should not of been prescribed due to cross-reactivity with her hydrocodone allergy.  Otherwise she is doing well.  The kidney stone resolved several months ago.  She has a recommendation for follow-up with cardiology and needs referral for that today.  She is not having any chest pain shortness of breath or palpitations.  Depression screen [facility] 2/9 04/17/2017 04/13/2017 02/03/2017  Decreased Interest 1 1 1   Down, Depressed, Hopeless 0 1 1  PHQ - 2 Score 1 2 2   Altered sleeping - 2 0  Tired, decreased energy - 3 1  Change in appetite - 2 2  Feeling bad or failure about yourself  - 2 2  Trouble concentrating - 3 3  Moving slowly or fidgety/restless - 3 3  Suicidal thoughts - 0 0  PHQ-9 Score - 17 13  Difficult doing work/chores - - -  Some recent data might be hidden    History Jerita has a past medical history of Arthritis, Bronchitis, Chronic bronchitis ([place]), Chronic kidney disease, Colon polyps, Constipation, Depression, Eczema, Epilepsy ([place]), Fatty liver, Headache(784.0), High cholesterol, History of kidney stones, Osteoarthritis, Other convulsions (05/21/12), Rectal bleed, Restless leg, Seizures ([place]), and Vertigo.   She has a past surgical history that  includes Total hip arthroplasty (Left, 1993; 1995; 2000); Abdominal hysterectomy (2001); Colonoscopy; Joint replacement; Mass excision (10/22/2011); Bladder suspension; Cesarean section (8144; 1988); Bunionectomy (Left, 2000); Total knee arthroplasty (Left, 05/07/2015); and Total knee arthroplasty (Right, 10/01/2015).   Her family history includes Bipolar disorder in her son; Cancer in her brother, maternal aunt, and mother; Depression in her father; Diabetes in her brother and maternal aunt; Drug abuse in her son.She reports that she has never smoked. She has never used smokeless tobacco. She reports that she does not drink alcohol or use drugs.    ROS Review of Systems  Constitutional: Negative.   HENT: Negative for congestion.   Eyes: Negative for visual disturbance.  Respiratory: Negative for shortness of breath.   Cardiovascular: Negative for chest pain.  Gastrointestinal: Negative for abdominal pain, constipation, diarrhea, nausea and vomiting.  Genitourinary: Negative for difficulty urinating.  Musculoskeletal: Negative for arthralgias and myalgias.  Neurological: Negative for headaches.  Psychiatric/Behavioral: Negative for sleep disturbance.    Objective:  BP 111/84   Pulse 95   Ht 5' (1.524 m)   Wt 160 lb 8 oz (72.8 kg)   BMI 31.35 kg/m   BP Readings from Last 3 Encounters:  04/17/17 111/84  04/15/17 121/83  04/13/17 124/84    Wt Readings from Last 3 Encounters:  04/17/17 160 lb 8 oz (72.8 kg)  04/15/17 159 lb 6.4 oz (72.3 kg)  04/13/17 162 lb 9.6 oz (73.8 kg)     Physical Exam  Constitutional: She is oriented to person, place, and time. She appears well-developed and well-nourished. No distress.  HENT:  Head: Normocephalic and atraumatic.  Right Ear: External ear normal.  Left Ear: External ear normal.  Neck: Normal range of motion. Neck supple.  Cardiovascular: Normal rate, regular rhythm and normal heart sounds.  No murmur heard. Pulmonary/Chest: Effort  normal and breath sounds normal. No respiratory distress. She has no wheezes. She has no rales.  Abdominal: Soft. There is no tenderness.  Musculoskeletal: Normal range of motion. She exhibits no edema or tenderness.  Neurological: She is alert and oriented to person, place, and time. She exhibits normal muscle tone. Coordination normal.  Skin: Skin is warm and dry.  Psychiatric: She has a normal mood and affect. Her behavior is normal.      Assessment & Plan:   Momoko was seen today for hospitalization follow-up.  Diagnoses and all orders for this visit:  Cardiac arrest [facility]) -     Ambulatory referral to Cardiology       I am having Laquiesha A. Pyper maintain her gabapentin, pramipexole, FLUoxetine HCl, mirtazapine, clonazePAM, Cyanocobalamin (VITAMIN B 12 PO), lamoTRIgine, cefdinir, and levETIRAcetam.  Allergies as of 04/17/2017      Reactions   Acetaminophen Other (See Comments)   Has fatty deposits on liver   Benadryl [diphenhydramine Hcl] Other (See Comments)   hyperactivity and seizures   Codeine Itching, Rash   seizures   Dilantin [phenytoin Sodium Extended] Other (See Comments)   Elevated LFT's   Melatonin Other (See Comments)   seizures   Ultram [tramadol Hcl] Other (See Comments)   Seizures   Vimpat [lacosamide] Other (See Comments)   Severe dizziness   Betadine [povidone Iodine] Rash   Latex Rash   Penicillins Itching, Rash   Has patient had a PCN reaction causing immediate rash, facial/tongue/throat swelling, SOB or lightheadedness with hypotension: No Has patient had a PCN reaction causing severe rash involving mucus membranes or skin necrosis: No Has patient had a PCN reaction that required hospitalization No Has patient had a PCN reaction occurring within the last 10 years: No If all of the above answers are "NO", then may proceed with Cephalosporin use.   Sulfa Antibiotics Rash   Tape Rash   Paper tape please   Vicodin [hydrocodone-acetaminophen]  Itching      Medication List        Accurate as of 04/17/17 11:59 PM. Always use your most recent med list.          cefdinir 300 MG capsule Commonly known as:  OMNICEF Take 1 capsule (300 mg total) by mouth 2 (two) times daily. 1 po BID   clonazePAM 0.5 MG tablet Commonly known as:  KLONOPIN TAKE 1 TABLET BY MOUTH ONCE DAILY AS NEEDED   FLUoxetine HCl 60 MG Tabs Take 60 mg by mouth daily.   gabapentin 600 MG tablet Commonly known as:  NEURONTIN Take 1 tablet (600 mg total) by mouth 3 (three) times daily.   lamoTRIgine 150 MG tablet Commonly known as:  LAMICTAL Take 1 tablet (150 mg total) by mouth 2 (two) times daily.   levETIRAcetam 1000 MG tablet Commonly known as:  KEPPRA Take 1 tablet PO in the morning and 1.5 tablets at bedtime   mirtazapine 7.5 MG tablet Commonly known as:  REMERON Take 1 tablet (7.5 mg total) by mouth at bedtime.   pramipexole 0.5 MG tablet Commonly known as:  MIRAPEX Take 1 tablet (0.5 mg total) by mouth 2 (  two) times daily.   VITAMIN B 12 PO Take 1 tablet by mouth daily.        Follow-up: Return if symptoms worsen or fail to improve.  [name], M.D.

## 2017-04-19 ENCOUNTER — Encounter: Payer: Self-pay | Admitting: Family Medicine

## 2017-04-19 LAB — CBC WITH DIFFERENTIAL/PLATELET
Basophils Absolute: 0 10*3/uL (ref 0.0–0.2)
Basos: 0 %
EOS (ABSOLUTE): 0 10*3/uL (ref 0.0–0.4)
Eos: 0 %
Hematocrit: 39.1 % (ref 34.0–46.6)
Hemoglobin: 12.7 g/dL (ref 11.1–15.9)
Immature Grans (Abs): 0 10*3/uL (ref 0.0–0.1)
Immature Granulocytes: 0 %
Lymphocytes Absolute: 2.9 10*3/uL (ref 0.7–3.1)
Lymphs: 38 %
MCH: 28.7 pg (ref 26.6–33.0)
MCHC: 32.5 g/dL (ref 31.5–35.7)
MCV: 88 fL (ref 79–97)
Monocytes Absolute: 0.6 10*3/uL (ref 0.1–0.9)
Monocytes: 8 %
Neutrophils Absolute: 4.1 10*3/uL (ref 1.4–7.0)
Neutrophils: 54 %
Platelets: 364 10*3/uL (ref 150–379)
RBC: 4.43 x10E6/uL (ref 3.77–5.28)
RDW: 14.5 % (ref 12.3–15.4)
WBC: 7.6 10*3/uL (ref 3.4–10.8)

## 2017-04-19 LAB — COMPREHENSIVE METABOLIC PANEL
ALT: 52 IU/L — ABNORMAL HIGH (ref 0–32)
AST: 40 IU/L (ref 0–40)
Albumin/Globulin Ratio: 1.8 (ref 1.2–2.2)
Albumin: 4.7 g/dL (ref 3.5–5.5)
Alkaline Phosphatase: 70 IU/L (ref 39–117)
BUN/Creatinine Ratio: 16 (ref 9–23)
BUN: 10 mg/dL (ref 6–24)
Bilirubin Total: 0.4 mg/dL (ref 0.0–1.2)
CO2: 25 mmol/L (ref 20–29)
Calcium: 9.9 mg/dL (ref 8.7–10.2)
Chloride: 99 mmol/L (ref 96–106)
Creatinine, Ser: 0.63 mg/dL (ref 0.57–1.00)
GFR calc Af Amer: 120 mL/min/{1.73_m2} (ref >59)
GFR calc non Af Amer: 104 mL/min/{1.73_m2} (ref >59)
Globulin, Total: 2.6 g/dL (ref 1.5–4.5)
Glucose: 90 mg/dL (ref 65–99)
Potassium: 4.6 mmol/L (ref 3.5–5.2)
Sodium: 141 mmol/L (ref 134–144)
Total Protein: 7.3 g/dL (ref 6.0–8.5)

## 2017-04-19 LAB — LAMOTRIGINE LEVEL: Lamotrigine Lvl: 4.8 ug/mL (ref 2.0–20.0)

## 2017-04-19 LAB — LEVETIRACETAM LEVEL: Levetiracetam Lvl: 26.7 ug/mL (ref 10.0–40.0)

## 2017-04-20 ENCOUNTER — Telehealth: Payer: Self-pay | Admitting: *Deleted

## 2017-04-20 NOTE — Telephone Encounter (Signed)
LVM requesting call back for lab results.  

## 2017-04-20 NOTE — Telephone Encounter (Signed)
Received call back and informed patient her lab work is relatively unremarkable. The ALT is slightly elevated, and we will monitor in the future. Patient verbalized understanding, appreciation.
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
## 2017-05-04 ENCOUNTER — Ambulatory Visit: Payer: PPO | Admitting: Family Medicine

## 2017-05-05 ENCOUNTER — Emergency Department (HOSPITAL_COMMUNITY)
Admission: EM | Admit: 2017-05-05 | Discharge: 2017-05-05 | Disposition: A | Discharge status: Home / Self Care | Payer: PPO | Attending: Emergency Medicine | Admitting: Emergency Medicine

## 2017-05-05 ENCOUNTER — Encounter (HOSPITAL_COMMUNITY): Payer: Self-pay | Admitting: Psychiatry

## 2017-05-05 ENCOUNTER — Encounter (HOSPITAL_COMMUNITY): Payer: Self-pay | Admitting: Emergency Medicine

## 2017-05-05 ENCOUNTER — Ambulatory Visit (INDEPENDENT_AMBULATORY_CARE_PROVIDER_SITE_OTHER): Payer: PPO | Admitting: Psychiatry

## 2017-05-05 VITALS — BP 124/83 | HR 77 | Ht 60.0 in | Wt 161.0 lb

## 2017-05-05 DIAGNOSIS — R569 Unspecified convulsions: Secondary | ICD-10-CM

## 2017-05-05 DIAGNOSIS — G40909 Epilepsy, unspecified, not intractable, without status epilepticus: Secondary | ICD-10-CM | POA: Diagnosis not present

## 2017-05-05 DIAGNOSIS — N189 Chronic kidney disease, unspecified: Secondary | ICD-10-CM | POA: Diagnosis not present

## 2017-05-05 DIAGNOSIS — F419 Anxiety disorder, unspecified: Secondary | ICD-10-CM | POA: Diagnosis not present

## 2017-05-05 DIAGNOSIS — Z818 Family history of other mental and behavioral disorders: Secondary | ICD-10-CM

## 2017-05-05 DIAGNOSIS — Z79899 Other long term (current) drug therapy: Secondary | ICD-10-CM | POA: Insufficient documentation

## 2017-05-05 DIAGNOSIS — F445 Conversion disorder with seizures or convulsions: Secondary | ICD-10-CM

## 2017-05-05 DIAGNOSIS — Z733 Stress, not elsewhere classified: Secondary | ICD-10-CM | POA: Diagnosis not present

## 2017-05-05 DIAGNOSIS — F331 Major depressive disorder, recurrent, moderate: Secondary | ICD-10-CM | POA: Diagnosis not present

## 2017-05-05 DIAGNOSIS — R410 Disorientation, unspecified: Secondary | ICD-10-CM | POA: Insufficient documentation

## 2017-05-05 DIAGNOSIS — R450 Nervousness: Secondary | ICD-10-CM | POA: Diagnosis not present

## 2017-05-05 DIAGNOSIS — G3184 Mild cognitive impairment, so stated: Secondary | ICD-10-CM

## 2017-05-05 DIAGNOSIS — G4700 Insomnia, unspecified: Secondary | ICD-10-CM

## 2017-05-05 DIAGNOSIS — Z813 Family history of other psychoactive substance abuse and dependence: Secondary | ICD-10-CM | POA: Diagnosis not present

## 2017-05-05 DIAGNOSIS — Z560 Unemployment, unspecified: Secondary | ICD-10-CM | POA: Diagnosis not present

## 2017-05-05 DIAGNOSIS — Z91040 Latex allergy status: Secondary | ICD-10-CM | POA: Diagnosis not present

## 2017-05-05 LAB — CBC WITH DIFFERENTIAL/PLATELET
Basophils Absolute: 0 10*3/uL (ref 0.0–0.1)
Basophils Relative: 0 %
Eosinophils Absolute: 0.1 10*3/uL (ref 0.0–0.7)
Eosinophils Relative: 1 %
HCT: 39.3 % (ref 36.0–46.0)
Hemoglobin: 12.6 g/dL (ref 12.0–15.0)
Lymphocytes Relative: 40 %
Lymphs Abs: 3.5 10*3/uL (ref 0.7–4.0)
MCH: 28.4 pg (ref 26.0–34.0)
MCHC: 32.1 g/dL (ref 30.0–36.0)
MCV: 88.7 fL (ref 78.0–100.0)
Monocytes Absolute: 0.7 10*3/uL (ref 0.1–1.0)
Monocytes Relative: 8 %
Neutro Abs: 4.4 10*3/uL (ref 1.7–7.7)
Neutrophils Relative %: 51 %
Platelets: 360 10*3/uL (ref 150–400)
RBC: 4.43 MIL/uL (ref 3.87–5.11)
RDW: 13.8 % (ref 11.5–15.5)
WBC: 8.7 10*3/uL (ref 4.0–10.5)

## 2017-05-05 LAB — COMPREHENSIVE METABOLIC PANEL
ALT: 69 U/L — ABNORMAL HIGH (ref 14–54)
AST: 60 U/L — ABNORMAL HIGH (ref 15–41)
Albumin: 4.4 g/dL (ref 3.5–5.0)
Alkaline Phosphatase: 72 U/L (ref 38–126)
Anion gap: 16 — ABNORMAL HIGH (ref 5–15)
BUN: 12 mg/dL (ref 6–20)
CO2: 20 mmol/L — ABNORMAL LOW (ref 22–32)
Calcium: 9.3 mg/dL (ref 8.9–10.3)
Chloride: 105 mmol/L (ref 101–111)
Creatinine, Ser: 0.72 mg/dL (ref 0.44–1.00)
GFR calc Af Amer: >60 mL/min (ref >60)
GFR calc non Af Amer: >60 mL/min (ref >60)
Glucose, Bld: 95 mg/dL (ref 65–99)
Potassium: 3.2 mmol/L — ABNORMAL LOW (ref 3.5–5.1)
Sodium: 141 mmol/L (ref 135–145)
Total Bilirubin: 0.6 mg/dL (ref 0.3–1.2)
Total Protein: 7.7 g/dL (ref 6.5–8.1)

## 2017-05-05 LAB — CBG MONITORING, ED: Glucose-Capillary: 87 mg/dL (ref 65–99)

## 2017-05-05 MED ORDER — LORAZEPAM 2 MG/ML IJ SOLN
2.0000 mg | Freq: Once | INTRAMUSCULAR | Status: AC
Start: 2017-05-05 — End: 2017-05-05
  Administered 2017-05-05: 2 mg via INTRAVENOUS

## 2017-05-05 MED ORDER — LORAZEPAM 2 MG/ML IJ SOLN
INTRAMUSCULAR | Status: AC
  Filled 2017-05-05: qty 1

## 2017-05-05 MED ORDER — SODIUM CHLORIDE 0.9 % IV BOLUS
1000.0000 mL | Freq: Once | INTRAVENOUS | Status: AC
  Administered 2017-05-05: 1000 mL via INTRAVENOUS

## 2017-05-05 MED ORDER — MIRTAZAPINE 15 MG PO TABS: 15.0000 mg | ORAL_TABLET | Freq: Every day | ORAL | 0 refills | Status: DC

## 2017-05-05 MED ORDER — FLUOXETINE HCL 60 MG PO TABS: 60.0000 mg | ORAL_TABLET | Freq: Every day | ORAL | 0 refills | Status: DC

## 2017-05-05 MED ORDER — LORAZEPAM 0.5 MG PO TABS: ORAL_TABLET | ORAL | 2 refills | Status: DC

## 2017-05-05 NOTE — ED Triage Notes (Signed)
Pt visitor in hospital.  Rapid response called for pt having seizure activity in the cafeteria.  Pt bought to ED via stretcher.   Pt postictal upon arrival to the ED.  Per daughter pt has been having seizures  Every other day for the last 2 months.  Pt has been taking lamictal and keppra

## 2017-05-05 NOTE — ED Notes (Signed)
Pt having seizure activity again.  Dr. Lacinda Axon notified.  Orders received and Dr. Lacinda Axon back at bedside.

## 2017-05-05 NOTE — Discharge Instructions (Addendum)
Follow-up with your neurologist tomorrow for further recommendations concerning your medications.
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
## 2017-05-05 NOTE — Patient Instructions (Signed)
1. Continue fluoxetine 60 mg daily  2. Increase mirtazapine 15 mg at night 3. Discontinue clonazepam 4. Start ativan 0.5 mg - 1 mg daily as needed for anxiety 5. Return to clinic in one month for 30 mins

## 2017-05-06 ENCOUNTER — Other Ambulatory Visit: Payer: Self-pay | Admitting: Neurology

## 2017-05-11 ENCOUNTER — Encounter: Payer: Self-pay | Admitting: Family Medicine

## 2017-05-12 ENCOUNTER — Ambulatory Visit (INDEPENDENT_AMBULATORY_CARE_PROVIDER_SITE_OTHER): Payer: PPO

## 2017-05-12 DIAGNOSIS — E785 Hyperlipidemia, unspecified: Secondary | ICD-10-CM | POA: Diagnosis not present

## 2017-05-12 DIAGNOSIS — G40802 Other epilepsy, not intractable, without status epilepticus: Secondary | ICD-10-CM | POA: Diagnosis not present

## 2017-05-12 DIAGNOSIS — G4089 Other seizures: Secondary | ICD-10-CM | POA: Diagnosis not present

## 2017-05-12 DIAGNOSIS — J42 Unspecified chronic bronchitis: Secondary | ICD-10-CM | POA: Diagnosis not present

## 2017-05-12 DIAGNOSIS — M1991 Primary osteoarthritis, unspecified site: Secondary | ICD-10-CM | POA: Diagnosis not present

## 2017-05-12 DIAGNOSIS — N183 Chronic kidney disease, stage 3 (moderate): Secondary | ICD-10-CM | POA: Diagnosis not present

## 2017-05-12 DIAGNOSIS — G40919 Epilepsy, unspecified, intractable, without status epilepticus: Secondary | ICD-10-CM | POA: Diagnosis not present

## 2017-05-12 DIAGNOSIS — G40909 Epilepsy, unspecified, not intractable, without status epilepticus: Secondary | ICD-10-CM | POA: Diagnosis not present

## 2017-05-12 DIAGNOSIS — R569 Unspecified convulsions: Secondary | ICD-10-CM | POA: Diagnosis not present

## 2017-05-12 DIAGNOSIS — M1990 Unspecified osteoarthritis, unspecified site: Secondary | ICD-10-CM | POA: Diagnosis not present

## 2017-05-12 DIAGNOSIS — F445 Conversion disorder with seizures or convulsions: Secondary | ICD-10-CM | POA: Diagnosis not present

## 2017-05-12 DIAGNOSIS — F459 Somatoform disorder, unspecified: Secondary | ICD-10-CM | POA: Diagnosis not present

## 2017-05-12 MED ORDER — GABAPENTIN 600 MG PO TABS
600.00 | ORAL_TABLET | ORAL | Status: DC
Start: 2017-05-12 — End: 2017-05-12

## 2017-05-12 MED ORDER — FLUOXETINE HCL 20 MG PO CAPS
60.00 | ORAL_CAPSULE | ORAL | Status: DC
Start: 2017-05-13 — End: 2017-05-12

## 2017-05-12 MED ORDER — LORAZEPAM 2 MG/ML IJ SOLN
2.00 | INTRAMUSCULAR | Status: DC
Start: ? — End: 2017-05-12

## 2017-05-12 MED ORDER — ACETAMINOPHEN 325 MG PO TABS
650.00 | ORAL_TABLET | ORAL | Status: DC
Start: ? — End: 2017-05-12

## 2017-05-12 MED ORDER — HYDROXYZINE HCL 25 MG PO TABS
25.00 | ORAL_TABLET | ORAL | Status: DC
Start: ? — End: 2017-05-12

## 2017-05-12 MED ORDER — ONDANSETRON HCL 4 MG/2ML IJ SOLN
4.00 | INTRAMUSCULAR | Status: DC
Start: ? — End: 2017-05-12

## 2017-05-12 MED ORDER — SODIUM CHLORIDE 0.9 % IJ SOLN
10.00 | INTRAMUSCULAR | Status: DC
Start: 2017-05-12 — End: 2017-05-12

## 2017-05-12 MED ORDER — LEVETIRACETAM 500 MG PO TABS
500.00 | ORAL_TABLET | ORAL | Status: DC
Start: 2017-05-12 — End: 2017-05-12

## 2017-05-12 MED ORDER — GENERIC EXTERNAL MEDICATION
150.00 | Status: DC
Start: 2017-05-12 — End: 2017-05-12

## 2017-05-12 MED ORDER — SODIUM CHLORIDE 0.9 % IJ SOLN
10.00 | INTRAMUSCULAR | Status: DC
Start: ? — End: 2017-05-12

## 2017-05-12 MED ORDER — PRAMIPEXOLE DIHYDROCHLORIDE 0.25 MG PO TABS
0.25 | ORAL_TABLET | ORAL | Status: DC
Start: 2017-05-12 — End: 2017-05-12

## 2017-05-13 DIAGNOSIS — G40802 Other epilepsy, not intractable, without status epilepticus: Secondary | ICD-10-CM | POA: Diagnosis not present

## 2017-05-13 DIAGNOSIS — R569 Unspecified convulsions: Secondary | ICD-10-CM | POA: Diagnosis not present

## 2017-05-13 DIAGNOSIS — I499 Cardiac arrhythmia, unspecified: Secondary | ICD-10-CM | POA: Diagnosis not present

## 2017-05-13 DIAGNOSIS — R9431 Abnormal electrocardiogram [ECG] [EKG]: Secondary | ICD-10-CM | POA: Diagnosis not present

## 2017-05-14 ENCOUNTER — Other Ambulatory Visit: Payer: Self-pay | Admitting: Neurology

## 2017-05-14 DIAGNOSIS — I499 Cardiac arrhythmia, unspecified: Secondary | ICD-10-CM | POA: Diagnosis not present

## 2017-05-14 DIAGNOSIS — R9431 Abnormal electrocardiogram [ECG] [EKG]: Secondary | ICD-10-CM | POA: Diagnosis not present

## 2017-05-15 NOTE — Telephone Encounter (Signed)
Patient has Hospital follow up on 04/17/17 in our office

## 2017-05-18 ENCOUNTER — Other Ambulatory Visit: Payer: Self-pay

## 2017-05-18 NOTE — Patient Outreach (Signed)
[facility]) Care Management  05/18/2017  IRETTA MANGRUM 01/07/1966 [id number]   Transition of care  Referral date: 05/15/17 Referral source: Discharged from [facility] on 05/13/17 Insurance: [employer] Primary MD; Dr. [name]  Transition of care calls will be completed by patients primary care office who will refer to [facility] care management if needed.    PLAN: RNCM will close patient due to patient being enrolled in an external program.   Quinn Plowman RN,BSN,CCM [facility] Telephonic  [phone]

## 2017-05-19 ENCOUNTER — Other Ambulatory Visit: Payer: Self-pay | Admitting: Family Medicine

## 2017-05-21 DIAGNOSIS — R569 Unspecified convulsions: Secondary | ICD-10-CM | POA: Diagnosis not present

## 2017-05-21 DIAGNOSIS — E7800 Pure hypercholesterolemia, unspecified: Secondary | ICD-10-CM | POA: Diagnosis not present

## 2017-05-21 DIAGNOSIS — G40909 Epilepsy, unspecified, not intractable, without status epilepticus: Secondary | ICD-10-CM | POA: Diagnosis not present

## 2017-05-21 DIAGNOSIS — Z79899 Other long term (current) drug therapy: Secondary | ICD-10-CM | POA: Diagnosis not present

## 2017-05-21 DIAGNOSIS — Z96642 Presence of left artificial hip joint: Secondary | ICD-10-CM | POA: Diagnosis not present

## 2017-05-28 ENCOUNTER — Encounter (HOSPITAL_COMMUNITY): Payer: Self-pay

## 2017-05-28 ENCOUNTER — Other Ambulatory Visit: Payer: Self-pay

## 2017-05-28 ENCOUNTER — Emergency Department (HOSPITAL_COMMUNITY)
Admission: EM | Admit: 2017-05-28 | Discharge: 2017-05-28 | Disposition: A | Discharge status: Home / Self Care | Payer: PPO | Attending: Emergency Medicine | Admitting: Emergency Medicine

## 2017-05-28 DIAGNOSIS — N189 Chronic kidney disease, unspecified: Secondary | ICD-10-CM | POA: Diagnosis not present

## 2017-05-28 DIAGNOSIS — R569 Unspecified convulsions: Secondary | ICD-10-CM | POA: Diagnosis not present

## 2017-05-28 DIAGNOSIS — Z96653 Presence of artificial knee joint, bilateral: Secondary | ICD-10-CM | POA: Diagnosis not present

## 2017-05-28 DIAGNOSIS — G40909 Epilepsy, unspecified, not intractable, without status epilepticus: Secondary | ICD-10-CM

## 2017-05-28 DIAGNOSIS — Z96642 Presence of left artificial hip joint: Secondary | ICD-10-CM | POA: Diagnosis not present

## 2017-05-28 LAB — CBG MONITORING, ED: Glucose-Capillary: 102 mg/dL — ABNORMAL HIGH (ref 65–99)

## 2017-05-28 MED ORDER — LORAZEPAM 0.5 MG PO TABS
0.5000 mg | ORAL_TABLET | Freq: Once | ORAL | Status: AC
  Administered 2017-05-28: 0.5 mg via ORAL
  Filled 2017-05-28: qty 1

## 2017-05-28 MED ORDER — LEVETIRACETAM IN NACL 1000 MG/100ML IV SOLN
1000.0000 mg | Freq: Once | INTRAVENOUS | Status: AC
  Administered 2017-05-28: 1000 mg via INTRAVENOUS
  Filled 2017-05-28: qty 100

## 2017-05-28 NOTE — ED Triage Notes (Signed)
EMS called out to [facility] for seizure activity. Pt was picking up medications (Ativan) that she says has been out of for 2 weeks. EMS reports pt was actively seizing (clonic tonic) upon arrival. Pt had two 30 second seizures in route, EMS described as pt "locked up" but no shaking or jerking. Pt alert and oriented x 4 upon arrival. Pt reports she has been taking her seizure medication as prescribed. Keppra was recently increased at [facility] end of April (last seizure). Pt reports taking hemp oil today and yesterday.

## 2017-05-28 NOTE — ED Provider Notes (Signed)
Emergency Department Provider Note   I have reviewed the triage vital signs and the nursing notes.   HISTORY  Chief Complaint Seizures   HPI [name] is a 52 y.o. female multiple medical problems documented below the presents the emergency department today secondary to breakthrough epileptic seizures and pseudoseizures.  Her daughter-in-law helps with a history of sounds like patient also has seizures but today she was at [facility] and she had a few back-to-back seizures for which she woke up and then she had a few pseudoseizures on the way here.  This is normal for when she is stressed out she is had multiple stressors recently with being sued and health problems in the family.  She has not hit her head or had any recent illnesses herself.  She has no headache, vision changes.  No recent nausea, vomiting, diarrhea or constipation. No other associated or modifying symptoms.    Past Medical History:  Diagnosis Date  . Arthritis    "knees" (04/22/2012)  . Bronchitis   . Chronic bronchitis ([place])    "yearly; when the weather changes" (04/22/2012)  . Chronic kidney disease    kidney stones  . Colon polyps    adenomatous and hyperplastic-  . Constipation   . Depression   . Eczema   . Epilepsy ([place])    "been having them right often here lately" (04/22/2012)  . Fatty liver   . MBBUYZJQ(964.3)    "1/wk" (04/22/2012)  . High cholesterol   . History of kidney stones   . Osteoarthritis    [name] 04/22/2012  . Other convulsions 05/21/12   non-epileptic spells  . Rectal bleed    in toilet- bright red  . Restless leg   . Seizures ([place])   . Vertigo     Patient Active Problem List   Diagnosis Date Noted  . Mild neurocognitive disorder 02/04/2017  . Seizure ([place]) 11/14/2016  . CAP (community acquired pneumonia) 11/09/2016  . 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 Moderate episode of recurrent major depressive disorder ([place]) 12/25/2015  . Bereavement 12/25/2015  . HCAP  (healthcare-associated pneumonia) 11/28/2015  . Multifocal pneumonia 11/28/2015  . Partial symptomatic epilepsy with complex partial seizures, not intractable, with status epilepticus ([place]) 07/24/2015  . Insomnia w/ sleep apnea 07/24/2015  . OA (osteoarthritis) of knee 05/07/2015  . Memory loss 02/20/2014  . Hypokalemia 06/23/2011  . Moderate major depression ([place]) 10/14/2010    Past Surgical History:  Procedure Laterality Date  . ABDOMINAL HYSTERECTOMY  2001  . BLADDER SUSPENSION    . BUNIONECTOMY Left 2000  . CESAREAN SECTION  1987; 1988  . COLONOSCOPY    . JOINT REPLACEMENT    . MASS EXCISION  10/22/2011   Procedure: EXCISION MASS;  Surgeon: [name], MD;  Location: [facility];  Service: General;  Laterality: Right;  excision right buttock mass  . TOTAL HIP ARTHROPLASTY Left 1993; 1995; 2000  . TOTAL KNEE ARTHROPLASTY Left 05/07/2015   Procedure: TOTAL LEFT KNEE ARTHROPLASTY;  Surgeon: [name], MD;  Location: [facility];  Service: Orthopedics;  Laterality: Left;  . TOTAL KNEE ARTHROPLASTY Right 10/01/2015   Procedure: RIGHT TOTAL KNEE ARTHROPLASTY;  Surgeon: [name], MD;  Location: [facility];  Service: Orthopedics;  Laterality: Right;    Current Outpatient Rx  . Order #: [id number] Class: Normal  . Order #: [id number] Class: Normal  . Order #: [id number] Class: Normal  . Order #: [id number] Class: Print  . Order #: [id number] Class: Normal  . Order #: [id number] Class: Normal  .  Order #: [id number] Class: Normal  . Order #: [id number] Class: Normal    Allergies Acetaminophen; Benadryl [diphenhydramine hcl]; Codeine; Dilantin [phenytoin sodium extended]; Melatonin; Ultram [tramadol hcl]; Vimpat [lacosamide]; Betadine [povidone iodine]; Latex; Penicillins; Sulfa antibiotics; Tape; and Vicodin [hydrocodone-acetaminophen]  Family History  Problem Relation Age of Onset  . Cancer Mother   . Depression Father   . Cancer Brother   . Diabetes Brother   . Cancer Maternal Aunt   .  Diabetes Maternal Aunt   . Bipolar disorder Son   . Drug abuse Son     Social History Social History   Tobacco Use  . Smoking status: Never Smoker  . Smokeless tobacco: Never Used  Substance Use Topics  . Alcohol use: No    Alcohol/week: 0.0 oz    Comment: 12-25-2015 per pt no  . Drug use: No    Comment: 21-12-2015 per pt no     Review of Systems  All other systems negative except as documented in the HPI. All pertinent positives and negatives as reviewed in the HPI. ____________________________________________   PHYSICAL EXAM:  VITAL SIGNS: ED Triage Vitals  Enc Vitals Group     BP 05/28/17 2012 121/81     Pulse Rate 05/28/17 2012 77     Resp 05/28/17 2012 17     Temp 05/28/17 2014 98.7 F (37.1 C)     Temp Source 05/28/17 2012 Oral     SpO2 05/28/17 2012 96 %     Weight 05/28/17 2015 160 lb (72.6 kg)     Height 05/28/17 2015 5' (1.524 m)    Constitutional: Alert and oriented. Well appearing and in no acute distress. Eyes: Conjunctivae are normal. PERRL. EOMI. Head: Atraumatic. Nose: No congestion/rhinnorhea. Mouth/Throat: Mucous membranes are moist.  Oropharynx non-erythematous. Neck: No stridor.  No meningeal signs.   Cardiovascular: Normal rate, regular rhythm. Good peripheral circulation. Grossly normal heart sounds.   Respiratory: Normal respiratory effort.  No retractions. Lungs CTAB. Gastrointestinal: Soft and nontender. No distention.  Musculoskeletal: No lower extremity tenderness nor edema. No gross deformities of extremities. Neurologic:  Normal speech and language. No gross focal neurologic deficits are appreciated. No altered mental status, able to give full seemingly accurate history.  Face is symmetric, EOM's intact, pupils equal and reactive, vision intact, tongue and uvula midline without deviation. Upper and Lower extremity motor 5/5, intact pain perception in distal extremities, 2+ reflexes in biceps, patella and achilles tendons. Able to  perform finger to nose normal with both hands.  Skin:  Skin is warm, dry and has small abrasion to left ear. No rash noted.  ____________________________________________   LABS (all labs ordered are listed, but only abnormal results are displayed)  Labs Reviewed  CBG MONITORING, ED - Abnormal; Notable for the following components:      Result Value   Glucose-Capillary 102 (*)    All other components within normal limits   _____________________________________________   INITIAL IMPRESSION / ASSESSMENT AND PLAN / ED COURSE  Out of her Ativan for a while so we will give her a dose of that also will give her a loading dose of Keppra she will continue taking it p.o. at home.  Will observe until these are done and if still stable will discharge.  No further seizure activity. Back to baseline. No indication for further workup at this time.    Pertinent labs & imaging results that were available during my care of the patient were reviewed by me and considered in my medical decision  making (see chart for details).  ____________________________________________  FINAL CLINICAL IMPRESSION(S) / ED DIAGNOSES  Final diagnoses:  Seizure disorder ([place])     MEDICATIONS GIVEN DURING THIS VISIT:  Medications  LORazepam (ATIVAN) tablet 0.5 mg (0.5 mg Oral Given 05/28/17 2115)  levETIRAcetam (KEPPRA) IVPB 1000 mg/100 mL premix (0 mg Intravenous Stopped 05/28/17 2131)     NEW OUTPATIENT MEDICATIONS STARTED DURING THIS VISIT:  Discharge Medication List as of 05/28/2017 10:27 PM      Note:  This note was prepared with assistance of Dragon voice recognition software. Occasional wrong-word or sound-a-like substitutions may have occurred due to the inherent limitations of voice recognition software.   Shontell Prosser, Corene Cornea, MD 05/29/17 [phone]

## 2017-06-01 ENCOUNTER — Encounter: Payer: Self-pay | Admitting: Internal Medicine

## 2017-06-05 ENCOUNTER — Encounter: Payer: Self-pay | Admitting: Family Medicine

## 2017-06-05 ENCOUNTER — Other Ambulatory Visit: Payer: Self-pay

## 2017-06-05 ENCOUNTER — Ambulatory Visit (INDEPENDENT_AMBULATORY_CARE_PROVIDER_SITE_OTHER): Payer: PPO | Admitting: Family Medicine

## 2017-06-05 VITALS — BP 130/82 | HR 70 | Temp 98.2°F | Ht 61.0 in | Wt 158.8 lb

## 2017-06-05 DIAGNOSIS — K760 Fatty (change of) liver, not elsewhere classified: Secondary | ICD-10-CM

## 2017-06-05 DIAGNOSIS — Z1231 Encounter for screening mammogram for malignant neoplasm of breast: Secondary | ICD-10-CM

## 2017-06-05 DIAGNOSIS — G40201 Localization-related (focal) (partial) symptomatic epilepsy and epileptic syndromes with complex partial seizures, not intractable, with status epilepticus: Secondary | ICD-10-CM

## 2017-06-05 DIAGNOSIS — F445 Conversion disorder with seizures or convulsions: Secondary | ICD-10-CM | POA: Diagnosis not present

## 2017-06-05 DIAGNOSIS — Z23 Encounter for immunization: Secondary | ICD-10-CM

## 2017-06-05 DIAGNOSIS — H60312 Diffuse otitis externa, left ear: Secondary | ICD-10-CM | POA: Diagnosis not present

## 2017-06-05 DIAGNOSIS — Z1239 Encounter for other screening for malignant neoplasm of breast: Secondary | ICD-10-CM

## 2017-06-05 DIAGNOSIS — F339 Major depressive disorder, recurrent, unspecified: Secondary | ICD-10-CM | POA: Diagnosis not present

## 2017-06-05 DIAGNOSIS — R945 Abnormal results of liver function studies: Secondary | ICD-10-CM

## 2017-06-05 DIAGNOSIS — R7989 Other specified abnormal findings of blood chemistry: Secondary | ICD-10-CM | POA: Insufficient documentation

## 2017-06-05 DIAGNOSIS — K635 Polyp of colon: Secondary | ICD-10-CM | POA: Insufficient documentation

## 2017-06-05 MED ORDER — NEOMYCIN-POLYMYXIN-HC 1 % OT SOLN: 3.0000 [drp] | Freq: Four times a day (QID) | OTIC | 0 refills | Status: AC

## 2017-06-05 NOTE — Progress Notes (Signed)
Subjective  CC:  Chief Complaint  Patient presents with  . Establish Care    Transfer from Dr. Livia Snellen at [facility], last Physical 07/07/2016, needs TDAP today & mammogram   . Ear Pain    Left sided ear pain x 1 month     HPI: [name] is a 52 y.o. female who presents to [place] at [facility] today to establish care with me as a new patient.  I've personally reviewed recent office visit notes, hospital notes, associated labs and imaging reports and/or pertinent outside office records via chart review or CareEverywhere. Briefly, former pt of Dr. Livia Snellen; in ED and hospital multiple times in last 5 months with PNA, then cardiac arrest due to med interaction, then multiple recurrent seizures, both epileptic and pseudo in n.   She has the following concerns or needs:  Doing ok now. Difficult life: ill husband, financial hardship, personality d/o and depression. Seeing psych ([place]), neuro. Feels her problems are currently stable. Has f/u with neuro in July.   Struck left ear during a seizure with small lac: now with painful left inner ear: 'feels swollen shut'; no drainage. Lac is healed. No ST or cold or allergy sxs.   HM: due for colonoscopy: h/o adenomatous polyps - pt to schedule; due for mammogram. Tdap due.  H/o fatty liver with mild recent elevation.  Lab Results  Component Value Date   ALT 69 (H) 05/05/2017   AST 60 (H) 05/05/2017   ALKPHOS 72 05/05/2017   BILITOT 0.6 05/05/2017    Assessment  1. Partial symptomatic epilepsy with complex partial seizures, not intractable, with status epilepticus ([place])   2. Elevated liver function tests   3. Fatty liver   4. Breast cancer screening   5. Need for vaccination   6. 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 Major depression, recurrent, chronic ([place])   8. Chronic diffuse otitis externa of left ear      Plan   Epilepsy and pseudoseizure; on AEDs. F/u with neuro. Counseling given regarding  stress mgt: start deep breathing exercises bid. Demonstrated in office.   Follow lfts on AEDs.   Set up mammo and colonoscopy  Pt to call to ensure tdap is a covered benefit. Will give at next visit if it is.   Depression is stable by report; managed by behavioral health.  Chronic otitis externa: start cotisporin drops. Stop pickin  Follow up:  Return in about 3 months (around 09/05/2017) for complete physical. Orders Placed This Encounter  Procedures  . MM Digital Screening   Meds ordered this encounter  Medications  . NEOMYCIN-POLYMYXIN-HYDROCORTISONE (CORTISPORIN) 1 % SOLN OTIC solution    Sig: Place 3 drops into the left ear 4 (four) times daily for 7 days.    Dispense:  10 mL    Refill:  0      We updated and reviewed the patient's past history in detail and it is documented below.  Patient Active Problem List   Diagnosis Date Noted  . Colon polyp 06/05/2017  . Elevated liver function tests 06/05/2017  . Fatty liver   . Mild neurocognitive disorder 02/04/2017  . 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 Bereavement 12/25/2015  . Partial symptomatic epilepsy with complex partial seizures, not intractable, with status epilepticus ([place]) 07/24/2015  . Insomnia w/ sleep apnea 07/24/2015  . OA (osteoarthritis) of knee 05/07/2015  . Memory loss 02/20/2014  . 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 Personality disorder ([place]) 02/14/2013  . Major depressive  disorder, single episode 10/14/2010   Health Maintenance  Topic Date Due  . Samul Dada  08/14/1984  . MAMMOGRAM  08/15/2015  . COLONOSCOPY  06/23/2017  . INFLUENZA VACCINE  08/13/2017  . PAP SMEAR  07/08/2019  . HIV Screening  Completed   Immunization History  Administered Date(s) Administered  . Influenza, Quadrivalent, Recombinant, Inj, Pf 12/18/2016  . Influenza,inj,Quad PF,6+ Mos 10/25/2012, 11/27/2014, 10/30/2015, 11/10/2016  . Influenza-Unspecified 10/22/2013   Current Meds  Medication Sig  . FLUoxetine HCl 60 MG TABS  Take 60 mg by mouth daily.  Marland Kitchen gabapentin (NEURONTIN) 600 MG tablet Take 1 tablet (600 mg total) by mouth 3 (three) times daily.  Marland Kitchen lamoTRIgine (LAMICTAL) 100 MG tablet TAKE 1 TABLET BY MOUTH TWICE DAILY  . lamoTRIgine (LAMICTAL) 150 MG tablet Take 1 tablet (150 mg total) by mouth 2 (two) times daily.  Marland Kitchen levETIRAcetam (KEPPRA) 1000 MG tablet Take 1 tablet PO in the morning and 1.5 tablets at bedtime  . LORazepam (ATIVAN) 0.5 MG tablet 0.5- 1 mg daily as needed for anxiety  . mirtazapine (REMERON) 15 MG tablet Take 1 tablet (15 mg total) by mouth at bedtime.  . pramipexole (MIRAPEX) 0.5 MG tablet Take 1 tablet (0.5 mg total) by mouth 2 (two) times daily.  . QUEtiapine (SEROQUEL) 50 MG tablet Take by mouth.    Allergies: Patient is allergic to acetaminophen; benadryl [diphenhydramine hcl]; codeine; dilantin [phenytoin sodium extended]; melatonin; ultram [tramadol hcl]; vimpat [lacosamide]; betadine [povidone iodine]; latex; penicillins; sulfa antibiotics; tape; and vicodin [hydrocodone-acetaminophen]. Past Medical History Patient  has a past medical history of Arthritis, Chronic bronchitis ([place]), Colon polyp, Colon polyps, Depression, Eczema, Epilepsy ([place]), Fatty liver, High cholesterol, History of kidney stones, Osteoarthritis, Other convulsions (05/21/12), Restless leg, Seizures ([place]), and Vertigo. Past Surgical History Patient  has a past surgical history that includes Total hip arthroplasty (Left, 1993; 1995; 2000); Abdominal hysterectomy (2001); Colonoscopy; Joint replacement; Mass excision (10/22/2011); Bladder suspension; Cesarean section (4174; 1988); Bunionectomy (Left, 2000); Total knee arthroplasty (Left, 05/07/2015); and Total knee arthroplasty (Right, 10/01/2015). Family History: Patient family history includes Bipolar disorder in her son; Cancer in her brother, maternal aunt, and mother; Depression in her father; Diabetes in her brother and maternal aunt; Drug abuse in her son. Social  History:  Patient  reports that she has never smoked. She has never used smokeless tobacco. She reports that she does not drink alcohol or use drugs.  Review of Systems: Constitutional: negative for fever or malaise Ophthalmic: negative for photophobia, double vision or loss of vision Cardiovascular: negative for chest pain, dyspnea on exertion, or new LE swelling Respiratory: negative for SOB or persistent cough Gastrointestinal: negative for abdominal pain, change in bowel habits or melena Genitourinary: negative for dysuria or gross hematuria Musculoskeletal: negative for new gait disturbance or muscular weakness Integumentary: negative for new or persistent rashes Neurological: negative for TIA or stroke symptoms Psychiatric: negative for SI or delusions Allergic/Immunologic: negative for hives  Patient Care Team    Relationship Specialty Notifications Start End  [name], MD PCP - General Family Medicine  06/05/17   [name], MD Referring Physician Family Medicine  05/12/17   Irene Shipper, MD Consulting Physician Gastroenterology  06/05/17   Dohmeier, Asencion Partridge, MD Consulting Physician Neurology  06/05/17     Objective  Vitals: BP 130/82   Pulse 70   Temp 98.2 F (36.8 C)   Ht 5\' 1"  (1.549 m)   Wt 158 lb 12.8 oz (72 kg)   BMI 30.00  kg/m  General:  Well developed, well nourished, no acute distress , appears comfortable Psych:  Alert and oriented,normal mood and affect HEENT:  Normocephalic, atraumatic, non-icteric sclera, PERRL, oropharynx is without mass or exudate, supple neck without adenopathy, mass or thyromegaly, Left TM is red and swollen with minimal canal opening; positive ttp. Healing induration on pinna. Right TM and EAC nl Cardiovascular:  RRR without gallop, rub or murmur, nondisplaced PMI Respiratory:  Good breath sounds bilaterally, CTAB with normal respiratory effort Gastrointestinal: normal bowel sounds, soft, non-tender, no noted masses. No  HSM Neurologic:    Mental status is normal. Gross motor and sensory exams are normal. Normal gait   Commons side effects, risks, benefits, and alternatives for medications and treatment plan prescribed today were discussed, and the patient expressed understanding of the given instructions. Patient is instructed to call or message via MyChart if he/she has any questions or concerns regarding our treatment plan. No barriers to understanding were identified. We discussed Red Flag symptoms and signs in detail. Patient expressed understanding regarding what to do in case of urgent or emergency type symptoms.   Medication list was reconciled, printed and provided to the patient in AVS. Patient instructions and summary information was reviewed with the patient as documented in the AVS. This note was prepared with assistance of Dragon voice recognition software. Occasional wrong-word or sound-a-like substitutions may have occurred due to the inherent limitations of voice recognition software

## 2017-06-05 NOTE — Patient Instructions (Addendum)
Thank you for establishing and allowing me to care for you. It means a lot to me.   Please schedule a follow up appointment with me in 3 months for your physical  Please call to schedule your colonoscopy  The order has been placed for your Mammogram. You will receive a call to schedule this.   Start Deep Breathing 2 times a day.

## 2017-06-09 ENCOUNTER — Ambulatory Visit (INDEPENDENT_AMBULATORY_CARE_PROVIDER_SITE_OTHER): Payer: PPO | Admitting: Cardiology

## 2017-06-09 ENCOUNTER — Encounter: Payer: Self-pay | Admitting: Cardiology

## 2017-06-09 VITALS — BP 104/62 | HR 64 | Ht 60.0 in | Wt 158.8 lb

## 2017-06-09 DIAGNOSIS — R0609 Other forms of dyspnea: Secondary | ICD-10-CM | POA: Diagnosis not present

## 2017-06-09 DIAGNOSIS — R569 Unspecified convulsions: Secondary | ICD-10-CM | POA: Diagnosis not present

## 2017-06-09 DIAGNOSIS — R0600 Dyspnea, unspecified: Secondary | ICD-10-CM

## 2017-06-09 DIAGNOSIS — G4730 Sleep apnea, unspecified: Secondary | ICD-10-CM | POA: Insufficient documentation

## 2017-06-09 DIAGNOSIS — G40909 Epilepsy, unspecified, not intractable, without status epilepticus: Secondary | ICD-10-CM

## 2017-06-09 DIAGNOSIS — Z659 Problem related to unspecified psychosocial circumstances: Secondary | ICD-10-CM | POA: Diagnosis not present

## 2017-06-09 DIAGNOSIS — R55 Syncope and collapse: Secondary | ICD-10-CM | POA: Diagnosis not present

## 2017-06-09 NOTE — Patient Instructions (Addendum)
Medication Instructions: Your physician recommends that you continue on your current medications as directed. Please refer to the Current Medication list given to you today.  If you need a refill on your cardiac medications before your next appointment, please call your pharmacy.   Procedures/Testing: Your physician has requested that you have an echocardiogram. Echocardiography is a painless test that uses sound waves to create images of your heart. It provides your doctor with information about the size and shape of your heart and how well your heart's chambers and valves are working. This procedure takes approximately one hour. There are no restrictions for this procedure. This will take place at [address], [address].  Your physician has recommended that you wear a 30 day event monitor. Event monitors are medical devices that record the heart's electrical activity. Doctors most often [name] these monitors to diagnose arrhythmias. Arrhythmias are problems with the speed or rhythm of the heartbeat. The monitor is a small, portable device. You can wear one while you do your normal daily activities. This is usually used to diagnose what is causing palpitations/syncope (passing out). This will be placed at [address], [address].   Follow-Up: Your physician wants you to follow-up after the event monitor with Dr. Gwenlyn Found.  Thank you for choosing [facility]!!

## 2017-06-09 NOTE — Assessment & Plan Note (Signed)
Followed by a psychiatrist secondary to stress from her husbands terminal illness.

## 2017-06-09 NOTE — Assessment & Plan Note (Signed)
Documented a year ago- pt felt it was from her seizure medication and did not follow through with C-pap

## 2017-06-09 NOTE — Progress Notes (Signed)
06/09/2017 [name]   [birth year]  [id number]  Primary Physician [name], MD Primary Cardiologist: Dr Gwenlyn Found (to see after testing)  HPI:  Pleasant 52 y/o female referred to [name] for cardiac evaluation. 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 She has ahd seizures since age 71. She was followed by Dr Erling Cruz, now Dr Brett Fairy. She has no history of CAD or MI. She tells me in Feb this year she had a "cardiac arrest" in the [facility] in [place]. Her son performed CPR. She says she was discharged in 24 hours, I suspect she had a seizure not cardiac arrest. She has been seen several times since both at [facility] and at [facility]. Her seizure medications have been adjusted and she appears to be doing well currently. She denies chest pain but admits to DOE doing housework. She denies tachycardia or palpitations. She was told after her last ED visit that she should see a cardiologist.    Current Outpatient Medications  Medication Sig Dispense Refill  . FLUoxetine HCl 60 MG TABS Take 60 mg by mouth daily. 90 tablet 0  . gabapentin (NEURONTIN) 600 MG tablet Take 1 tablet (600 mg total) by mouth 3 (three) times daily. 90 tablet 5  . lamoTRIgine (LAMICTAL) 150 MG tablet Take 1 tablet (150 mg total) by mouth 2 (two) times daily. 60 tablet 0  . levETIRAcetam (KEPPRA) 1000 MG tablet Take 1 tablet PO in the morning and 1.5 tablets at bedtime 75 tablet 5  . LORazepam (ATIVAN) 0.5 MG tablet 0.5- 1 mg daily as needed for anxiety 30 tablet 2  . mirtazapine (REMERON) 15 MG tablet Take 1 tablet (15 mg total) by mouth at bedtime. 90 tablet 0  . NEOMYCIN-POLYMYXIN-HYDROCORTISONE (CORTISPORIN) 1 % SOLN OTIC solution Place 3 drops into the left ear 4 (four) times daily for 7 days. 10 mL 0  . pramipexole (MIRAPEX) 0.5 MG tablet Take 1 tablet (0.5 mg total) by mouth 2 (two) times daily. 60 tablet 5   No current facility-administered medications for this visit.     Allergies    Allergen Reactions  . Acetaminophen Other (See Comments)    Has fatty deposits on liver  . Benadryl [Diphenhydramine Hcl] Other (See Comments)    hyperactivity and seizures  . Codeine Itching and Rash    seizures  . Dilantin [Phenytoin Sodium Extended] Other (See Comments)    Elevated LFT's  . Melatonin Other (See Comments)    seizures  . Ultram [Tramadol Hcl] Other (See Comments)    Seizures  . Vimpat [Lacosamide] Other (See Comments)    Severe dizziness  . Betadine [Povidone Iodine] Rash  . Latex Rash  . Penicillins Itching and Rash    Has patient had a PCN reaction causing immediate rash, facial/tongue/throat swelling, SOB or lightheadedness with hypotension: No Has patient had a PCN reaction causing severe rash involving mucus membranes or skin necrosis: No Has patient had a PCN reaction that required hospitalization No Has patient had a PCN reaction occurring within the last 10 years: No If all of the above answers are "NO", then may proceed with Cephalosporin use.  . Sulfa Antibiotics Rash  . Tape Rash    Paper tape please  . Vicodin [Hydrocodone-Acetaminophen] Itching    Past Medical History:  Diagnosis Date  . Arthritis    "knees" (04/22/2012)  . Chronic bronchitis ([place])    "yearly; when the weather changes" (04/22/2012)  . Colon polyp   .  Colon polyps    adenomatous and hyperplastic-  . Depression   . Eczema   . Epilepsy ([place])    "been having them right often here lately" (04/22/2012)  . Fatty liver   . High cholesterol   . History of kidney stones   . Osteoarthritis    [name] 04/22/2012  . Other convulsions 05/21/12   non-epileptic spells  . Restless leg   . Seizures ([place])   . Vertigo     Social History   Socioeconomic History  . Marital status: Married    Spouse name: [name]  . Number of children: 2  . Years of education: 12 +  . Highest education level: Not on file  Occupational History  . Occupation: disability, medical  Social Needs  .  Financial resource strain: Not on file  . Food insecurity:    Worry: Not on file    Inability: Not on file  . Transportation needs:    Medical: Not on file    Non-medical: Not on file  Tobacco Use  . Smoking status: Never Smoker  . Smokeless tobacco: Never Used  Substance and Sexual Activity  . Alcohol use: No    Alcohol/week: 0.0 oz    Comment: 12-25-2015 per pt no  . Drug use: No    Comment: 21-12-2015 per pt no   . Sexual activity: Not Currently    Birth control/protection: Surgical  Lifestyle  . Physical activity:    Days per week: Not on file    Minutes per session: Not on file  . Stress: Not on file  Relationships  . Social connections:    Talks on phone: Not on file    Gets together: Not on file    Attends religious service: Not on file    Active member of club or organization: Not on file    Attends meetings of clubs or organizations: Not on file    Relationship status: Not on file  . Intimate partner violence:    Fear of current or ex partner: Not on file    Emotionally abused: Not on file    Physically abused: Not on file    Forced sexual activity: Not on file  Other Topics Concern  . Not on file  Social History Narrative   Patient is married [name]) and lives at home with her husband who is seriously ill.    Patient has two adult children.   Patient is disabled.   Patient has a high school education and some trade school.   Patient is right-handed.   Patient drinks four glasses of soda and tea daily.     Family History  Problem Relation Age of Onset  . Cancer Mother   . Depression Father   . Cancer Brother   . Diabetes Brother   . Cancer Maternal Aunt   . Diabetes Maternal Aunt   . Bipolar disorder Son   . Drug abuse Son   . Heart attack Sister 41       during severe illness     Review of Systems: General: negative for chills, fever, night sweats or weight changes.  Cardiovascular: negative for chest pain, edema, orthopnea, palpitations,  paroxysmal nocturnal dyspnea or shortness of breath Dermatological: negative for rash Respiratory: negative for cough or wheezing Urologic: negative for hematuria Abdominal: negative for nausea, vomiting, diarrhea, bright red blood per rectum, melena, or hematemesis Neurologic: negative for visual changes, syncope, or dizziness H/O sleep apnea- ? Secondary to seizure medication- not on C-pap  All other systems reviewed and are otherwise negative except as noted above.    Blood pressure 104/62, pulse 64, height 5' (1.524 m), weight 158 lb 12.8 oz (72 kg).  General appearance: alert, cooperative and no distress Neck: no carotid bruit and no JVD Lungs: clear to auscultation bilaterally Heart: regular rate and rhythm Abdomen: soft, non-tender; bowel sounds normal; no masses,  no organomegaly Extremities: extremities normal, atraumatic, no cyanosis or edema Pulses: 2+ and symmetric Skin: Skin color, texture, turgor normal. No rashes or lesions Neurologic: Grossly normal  EKG NSR, QTc 429  ASSESSMENT AND PLAN:   Seizure disorder (HCC) H/O seizure disorder- followed at [facility]  Other social stressor Followed by a psychiatrist secondary to stress from her husbands terminal illness.   Sleep apnea Documented a year ago- pt felt it was from her seizure medication and did not follow through with C-pap  DOE (dyspnea on exertion) R/O cardiac etiology   PLAN  Discussed with Dr Gwenlyn Found, will order an echo and an Event Monitor, f/u with Dr Gwenlyn Found after this. I encouraged her to speak with her Neurologist about sleep apnea and her medications. I told her if she needs the medications for seizures then she should consider C-pap as well.   Kerin Ransom PA-C 06/09/2017 10:44 AM

## 2017-06-09 NOTE — Assessment & Plan Note (Signed)
H/O seizure disorder- followed at [facility]

## 2017-06-09 NOTE — Assessment & Plan Note (Signed)
R/O cardiac etiology 

## 2017-06-11 DIAGNOSIS — H40013 Open angle with borderline findings, low risk, bilateral: Secondary | ICD-10-CM | POA: Diagnosis not present

## 2017-06-15 ENCOUNTER — Ambulatory Visit (HOSPITAL_COMMUNITY): Payer: PPO

## 2017-06-25 ENCOUNTER — Ambulatory Visit (INDEPENDENT_AMBULATORY_CARE_PROVIDER_SITE_OTHER): Payer: PPO

## 2017-06-25 ENCOUNTER — Other Ambulatory Visit: Payer: Self-pay

## 2017-06-25 ENCOUNTER — Ambulatory Visit (HOSPITAL_COMMUNITY): Payer: PPO | Attending: Cardiology

## 2017-06-25 DIAGNOSIS — I081 Rheumatic disorders of both mitral and tricuspid valves: Secondary | ICD-10-CM | POA: Diagnosis not present

## 2017-06-25 DIAGNOSIS — R569 Unspecified convulsions: Secondary | ICD-10-CM | POA: Diagnosis not present

## 2017-06-25 DIAGNOSIS — R0600 Dyspnea, unspecified: Secondary | ICD-10-CM | POA: Diagnosis not present

## 2017-06-25 DIAGNOSIS — G40802 Other epilepsy, not intractable, without status epilepticus: Secondary | ICD-10-CM | POA: Diagnosis not present

## 2017-06-25 DIAGNOSIS — R55 Syncope and collapse: Secondary | ICD-10-CM

## 2017-06-25 DIAGNOSIS — G40909 Epilepsy, unspecified, not intractable, without status epilepticus: Secondary | ICD-10-CM | POA: Diagnosis not present

## 2017-07-02 DIAGNOSIS — R55 Syncope and collapse: Secondary | ICD-10-CM | POA: Diagnosis not present

## 2017-07-08 ENCOUNTER — Ambulatory Visit: Payer: Self-pay

## 2017-07-08 ENCOUNTER — Other Ambulatory Visit: Payer: Self-pay | Admitting: Neurology

## 2017-07-17 ENCOUNTER — Other Ambulatory Visit: Payer: Self-pay | Admitting: Neurology

## 2017-07-20 ENCOUNTER — Other Ambulatory Visit: Payer: Self-pay | Admitting: Neurology

## 2017-07-20 MED ORDER — LAMOTRIGINE 150 MG PO TABS: 150.0000 mg | ORAL_TABLET | Freq: Two times a day (BID) | ORAL | 5 refills | Status: DC

## 2017-07-23 ENCOUNTER — Ambulatory Visit (HOSPITAL_COMMUNITY): Payer: PPO | Admitting: Psychiatry

## 2017-07-23 ENCOUNTER — Encounter (HOSPITAL_COMMUNITY): Payer: Self-pay | Admitting: Psychiatry

## 2017-07-23 VITALS — BP 121/87 | HR 89 | Ht 60.0 in | Wt 154.0 lb

## 2017-07-23 DIAGNOSIS — F331 Major depressive disorder, recurrent, moderate: Secondary | ICD-10-CM | POA: Diagnosis not present

## 2017-07-23 MED ORDER — MIRTAZAPINE 15 MG PO TABS: 7.5000 mg | ORAL_TABLET | Freq: Every day | ORAL | 0 refills | Status: DC

## 2017-07-23 MED ORDER — LORAZEPAM 1 MG PO TABS: ORAL_TABLET | ORAL | 0 refills | Status: DC

## 2017-07-23 MED ORDER — FLUOXETINE HCL 60 MG PO TABS: 60.0000 mg | ORAL_TABLET | Freq: Every day | ORAL | 0 refills | Status: DC

## 2017-07-23 NOTE — Patient Instructions (Signed)
1. Continue fluoxetine 60 mg daily  2.Reinitiatemirtazapine7.5 mg at night 3. Continue ativan 1 mg -1.5 mg daily as needed for anxiety  4. Return to clinic in one month for 30 mins

## 2017-07-23 NOTE — Progress Notes (Signed)
[facility] MD/PA/NP OP Progress Note  07/23/2017 3:07 PM [name]  MRN:  [id number]  Chief Complaint:  Chief Complaint    Depression; Follow-up; Anxiety     HPI:  Patient presents for follow-up appointment for depression, accompanied by her daughter in law..  She states that her husband deceased early this month. He passed away in his own bed. She misses him and thinks about him every day. She had "seizure" like episodes almost every day. Although the patient does not recall the event, her daughter in law witnesses the event. She would start jerking her body, screaming, and biting her tongue. It lasts from seconds to five minutes. She has some postictal phase of not responding for a while. She fell one time during this episode. Her daughter in law contacted her neurologist and she has an appointment in August. The patient takes ativan up to 1.5 mg daily for anxiety. She feels anxious, tense and had panic attack yesterday. She feels depressed. She denies SI. She has occasional insomnia. Her mother discontinued mirtazapine a while ago, thinking that it causes her seizure. The patient feels comfortable reinitiating it given the fact that the patient did have seizure free periods while on mirtazapine in the past.   Wt Readings from Last 3 Encounters:  07/23/17 154 lb (69.9 kg)  06/09/17 158 lb 12.8 oz (72 kg)  06/05/17 158 lb 12.8 oz (72 kg)   Per PMP,  Ativan filled on 07/06/2017  Visit Diagnosis:    ICD-10-CM   1. Moderate episode of recurrent major depressive disorder ([place]) F33.1     Past Psychiatric History: Please see initial evaluation for full details. I have reviewed the history. No updates at this time.     Past Medical History:  Past Medical History:  Diagnosis Date  . Arthritis    "knees" (04/22/2012)  . Chronic bronchitis ([place])    "yearly; when the weather changes" (04/22/2012)  . Colon polyp   . Colon polyps    adenomatous and hyperplastic-  . Depression   . Eczema   .  Epilepsy ([place])    "been having them right often here lately" (04/22/2012)  . Fatty liver   . High cholesterol   . History of kidney stones   . Osteoarthritis    [name] 04/22/2012  . Other convulsions 05/21/12   non-epileptic spells  . Restless leg   . Seizures ([place])   . Vertigo     Past Surgical History:  Procedure Laterality Date  . ABDOMINAL HYSTERECTOMY  2001  . BLADDER SUSPENSION    . BUNIONECTOMY Left 2000  . CESAREAN SECTION  1987; 1988  . COLONOSCOPY    . JOINT REPLACEMENT    . MASS EXCISION  10/22/2011   Procedure: EXCISION MASS;  Surgeon: [name], MD;  Location: [facility];  Service: General;  Laterality: Right;  excision right buttock mass  . TOTAL HIP ARTHROPLASTY Left 1993; 1995; 2000  . TOTAL KNEE ARTHROPLASTY Left 05/07/2015   Procedure: TOTAL LEFT KNEE ARTHROPLASTY;  Surgeon: [name], MD;  Location: [facility];  Service: Orthopedics;  Laterality: Left;  . TOTAL KNEE ARTHROPLASTY Right 10/01/2015   Procedure: RIGHT TOTAL KNEE ARTHROPLASTY;  Surgeon: [name], MD;  Location: [facility];  Service: Orthopedics;  Laterality: Right;    Family Psychiatric History: Please see initial evaluation for full details. I have reviewed the history. No updates at this time.     Family History:  Family History  Problem Relation Age of Onset  .  Cancer Mother   . Depression Father   . Cancer Brother   . Diabetes Brother   . Cancer Maternal Aunt   . Diabetes Maternal Aunt   . Bipolar disorder Son   . Drug abuse Son   . Heart attack Sister 14       during severe illness    Social History:  Social History   Socioeconomic History  . Marital status: Married    Spouse name: [name]  . Number of children: 2  . Years of education: 12 +  . Highest education level: Not on file  Occupational History  . Occupation: disability, medical  Social Needs  . Financial resource strain: Not on file  . Food insecurity:    Worry: Not on file    Inability: Not on file  .  Transportation needs:    Medical: Not on file    Non-medical: Not on file  Tobacco Use  . Smoking status: Never Smoker  . Smokeless tobacco: Never Used  Substance and Sexual Activity  . Alcohol use: No    Alcohol/week: 0.0 oz    Comment: 12-25-2015 per pt no  . Drug use: No    Comment: 21-12-2015 per pt no   . Sexual activity: Not Currently    Birth control/protection: Surgical  Lifestyle  . Physical activity:    Days per week: Not on file    Minutes per session: Not on file  . Stress: Not on file  Relationships  . Social connections:    Talks on phone: Not on file    Gets together: Not on file    Attends religious service: Not on file    Active member of club or organization: Not on file    Attends meetings of clubs or organizations: Not on file    Relationship status: Not on file  Other Topics Concern  . Not on file  Social History Narrative   Patient is married [name]) and lives at home with her husband who is seriously ill.    Patient has two adult children.   Patient is disabled.   Patient has a high school education and some trade school.   Patient is right-handed.   Patient drinks four glasses of soda and tea daily.    Allergies:  Allergies  Allergen Reactions  . Acetaminophen Other (See Comments)    Has fatty deposits on liver  . Benadryl [Diphenhydramine Hcl] Other (See Comments)    hyperactivity and seizures  . Codeine Itching and Rash    seizures  . Dilantin [Phenytoin Sodium Extended] Other (See Comments)    Elevated LFT's  . Melatonin Other (See Comments)    seizures  . Ultram [Tramadol Hcl] Other (See Comments)    Seizures  . Vimpat [Lacosamide] Other (See Comments)    Severe dizziness  . Betadine [Povidone Iodine] Rash  . Latex Rash  . Penicillins Itching and Rash    Has patient had a PCN reaction causing immediate rash, facial/tongue/throat swelling, SOB or lightheadedness with hypotension: No Has patient had a PCN reaction causing severe  rash involving mucus membranes or skin necrosis: No Has patient had a PCN reaction that required hospitalization No Has patient had a PCN reaction occurring within the last 10 years: No If all of the above answers are "NO", then may proceed with Cephalosporin use.  . Sulfa Antibiotics Rash  . Tape Rash    Paper tape please  . Vicodin [Hydrocodone-Acetaminophen] Itching    Metabolic Disorder Labs: Lab Results  Component Value Date   HGBA1C 5.4 11/28/2015   MPG 108 11/28/2015   No results found for: PROLACTIN Lab Results  Component Value Date   CHOL 164 07/07/2016   TRIG 124 07/07/2016   HDL 53 07/07/2016   CHOLHDL 3.1 07/07/2016   LDLCALC 86 07/07/2016   LDLCALC 136 (H) 04/09/2015   Lab Results  Component Value Date   TSH 1.900 07/07/2016   TSH 2.559 12/03/2015    Therapeutic Level Labs: No results found for: LITHIUM No results found for: VALPROATE No components found for:  CBMZ  Current Medications: Current Outpatient Medications  Medication Sig Dispense Refill  . FLUoxetine HCl 60 MG TABS Take 60 mg by mouth daily. 90 tablet 0  . gabapentin (NEURONTIN) 600 MG tablet Take 1 tablet (600 mg total) by mouth 3 (three) times daily. 90 tablet 5  . lamoTRIgine (LAMICTAL) 150 MG tablet Take 1 tablet (150 mg total) by mouth 2 (two) times daily. 60 tablet 5  . levETIRAcetam (KEPPRA) 1000 MG tablet Take 1 tablet PO in the morning and 1.5 tablets at bedtime 75 tablet 5  . LORazepam (ATIVAN) 1 MG tablet 1-1.5 mg daily as needed for anxiety 45 tablet 0  . mirtazapine (REMERON) 15 MG tablet Take 0.5 tablets (7.5 mg total) by mouth at bedtime. 45 tablet 0  . pramipexole (MIRAPEX) 0.5 MG tablet Take 1 tablet (0.5 mg total) by mouth 2 (two) times daily. 60 tablet 5   No current facility-administered medications for this visit.      Musculoskeletal: Strength & Muscle Tone: within normal limits Gait & Station: normal Patient leans: N/A  Psychiatric Specialty Exam: Review of  Systems  Psychiatric/Behavioral: Positive for depression. Negative for hallucinations, memory loss, substance abuse and suicidal ideas. The patient is nervous/anxious. The patient does not have insomnia.   All other systems reviewed and are negative.   Blood pressure 121/87, pulse 89, height 5' (1.524 m), weight 154 lb (69.9 kg), SpO2 98 %.Body mass index is 30.08 kg/m.  General Appearance: Fairly Groomed  Eye Contact:  Good  Speech:  Clear and Coherent  Volume:  Normal  Mood:  Depressed  Affect:  Appropriate, Congruent and down  Thought Process:  Coherent  Orientation:  Full (Time, Place, and Person)  Thought Content: Logical   Suicidal Thoughts:  No  Homicidal Thoughts:  No  Memory:  Immediate;   Good  Judgement:  Good  Insight:  Fair  Psychomotor Activity:  Normal  Concentration:  Concentration: Good and Attention Span: Good  Recall:  Good  Fund of Knowledge: Good  Language: Good  Akathisia:  No  Handed:  Right  AIMS (if indicated): NA  Assets:  Communication Skills Desire for Improvement  ADL's:  Intact  Cognition: WNL  Sleep:  Fair   Screenings: GAD-7     Office Visit from 01/31/2015 in [place]  Total GAD-7 Score  15    Mini-Mental     Office Visit from 02/20/2014 in [facility]  Total Score (max 30 points )  30    PHQ2-9     Office Visit from 04/17/2017 in [place] Office Visit from 04/13/2017 in [place] Office Visit from 02/03/2017 in [place] Office Visit from 01/30/2017 in [place] Office Visit from 01/09/2017 in [place]  PHQ-2 Total Score  1  2  2   0  1  PHQ-9 Total Score  -  17  13  -  -       Assessment and Plan:  [name] is a 52 y.o. year old female with a history of depression,  pseudoseizure, partial symptomatic epilepsy with complex partial seizures, osteoarthritis,  congenital hip dislocation s/p replacement , who presents for follow up appointment for Moderate episode of recurrent major depressive disorder ([place])  # MDD, moderate, recurrent without psychotic features Patient reports worsening in seizure-like episode in the setting of loss of her husband.  She also states that her mother discontinued mirtazapine.  We will reinitiate mirtazapine to target neurovegetative symptoms. Noted that although psychotropics does have some increased risk of seizure, I believe that the benefit of outweighs the risk given her mood symptoms attribute to her episodes of seizure like symptoms. Will continue fluoxetine for depression. Will uptitrate ativan to target anxiety. Discussed risk of sedation and dependence. Validated and normalized her grief. She will greatly benefit from therapy; she is encouraged to continue to see her therapist in [place].   # Mild neurocognitive disorder Patient does have mild cognitive impairment, measured in [place]. Will consider Aricept if any worsening in her symptoms.    Plan I have reviewed and updated plans as below 1. Continue fluoxetine 60 mg daily  2.Reinitiatemirtazapine7.5 mg at night 3. Continue ativan 1 mg -1.5 mg daily as needed for anxiety  4. Return to clinic in one month for 30 mins  Patient is on gabapentin, Lamictal, Keppra for seizure. She is also on pramipexole for restless leg - Will obtain record from [facility] at the next visit  The patient demonstrates the following risk factors for suicide: Chronic risk factors for suicide include: psychiatric disorder of depressionand previous suicide attempts of overdosing medication. Acute risk factorsfor suicide include: unemployment and her husband with terminal illness. Protective factorsfor this patient include: positive social support, coping skills and hope for the future. Considering these factors, the overall suicide risk at this point appears to be low.  Patient isappropriate for outpatient follow up.  The duration of this appointment visit was 30 minutes of face-to-face time with the patient.  Greater than 50% of this time was spent in counseling, explanation of  diagnosis, planning of further management, and coordination of care.  Norman Clay, MD 07/23/2017, 3:07 PM

## 2017-07-24 ENCOUNTER — Ambulatory Visit: Payer: PPO | Admitting: Cardiovascular Disease

## 2017-07-27 ENCOUNTER — Encounter: Payer: Self-pay | Admitting: *Deleted

## 2017-07-28 DIAGNOSIS — Z0289 Encounter for other administrative examinations: Secondary | ICD-10-CM

## 2017-07-29 ENCOUNTER — Other Ambulatory Visit: Payer: Self-pay | Admitting: *Deleted

## 2017-07-29 NOTE — Patient Outreach (Signed)
High Risk HTA pt with multiple ED visits recently. I have called and left a message at the pt's home today. I also called her daughter, Rip Harbour, but her voice mail was full. I then sent her a text message to call me to discuss [facility] program.  If I don't hear back I will call again on Friday.  [name], MSN, [facility] Gerontological Nurse Practitioner [facility] Care Management [phone]

## 2017-07-30 ENCOUNTER — Other Ambulatory Visit: Payer: Self-pay | Admitting: *Deleted

## 2017-07-30 NOTE — Patient Outreach (Signed)
Telephone screen attempted, 2nd. No one answered the phone but I left a voice mail and requested a return call. I also called pt's sister-in-law and left a message for her also to return my call.  [name]. Myrtie Neither, MSN, [facility] Gerontological Nurse Practitioner [facility] Care Management [phone]

## 2017-08-03 ENCOUNTER — Telehealth: Payer: Self-pay | Admitting: *Deleted

## 2017-08-03 NOTE — Telephone Encounter (Signed)
I faxed pt form to [facility] to # [phone]

## 2017-08-04 ENCOUNTER — Ambulatory Visit (HOSPITAL_COMMUNITY): Payer: Self-pay | Admitting: Psychiatry

## 2017-08-04 ENCOUNTER — Other Ambulatory Visit: Payer: Self-pay | Admitting: *Deleted

## 2017-08-04 ENCOUNTER — Encounter: Payer: Self-pay | Admitting: *Deleted

## 2017-08-04 NOTE — Patient Outreach (Signed)
Talked with [name] today. I explained the benefits of [facility] and that her insurance provider has referred her to our services.. She said she would like to have the informaton via the mail and talk with her mother about it. I will request that this be sent out and I will call back in 2 weeks.  [name], MSN, [facility] Gerontological Nurse Practitioner [facility] Care Management [phone]

## 2017-08-06 ENCOUNTER — Other Ambulatory Visit: Payer: Self-pay

## 2017-08-06 ENCOUNTER — Emergency Department (HOSPITAL_COMMUNITY)
Admission: EM | Admit: 2017-08-06 | Discharge: 2017-08-06 | Disposition: A | Discharge status: Home / Self Care | Payer: PPO | Attending: Emergency Medicine | Admitting: Emergency Medicine

## 2017-08-06 ENCOUNTER — Encounter (HOSPITAL_COMMUNITY): Payer: Self-pay

## 2017-08-06 ENCOUNTER — Ambulatory Visit: Payer: Self-pay | Admitting: Family Medicine

## 2017-08-06 DIAGNOSIS — Z96653 Presence of artificial knee joint, bilateral: Secondary | ICD-10-CM | POA: Diagnosis not present

## 2017-08-06 DIAGNOSIS — Z79899 Other long term (current) drug therapy: Secondary | ICD-10-CM | POA: Insufficient documentation

## 2017-08-06 DIAGNOSIS — R42 Dizziness and giddiness: Secondary | ICD-10-CM | POA: Insufficient documentation

## 2017-08-06 DIAGNOSIS — R404 Transient alteration of awareness: Secondary | ICD-10-CM | POA: Diagnosis not present

## 2017-08-06 DIAGNOSIS — Z96642 Presence of left artificial hip joint: Secondary | ICD-10-CM | POA: Diagnosis not present

## 2017-08-06 DIAGNOSIS — R457 State of emotional shock and stress, unspecified: Secondary | ICD-10-CM | POA: Diagnosis not present

## 2017-08-06 LAB — CBC
HCT: 34.8 % — ABNORMAL LOW (ref 36.0–46.0)
Hemoglobin: 11.3 g/dL — ABNORMAL LOW (ref 12.0–15.0)
MCH: 28.4 pg (ref 26.0–34.0)
MCHC: 32.5 g/dL (ref 30.0–36.0)
MCV: 87.4 fL (ref 78.0–100.0)
Platelets: 264 10*3/uL (ref 150–400)
RBC: 3.98 MIL/uL (ref 3.87–5.11)
RDW: 14.6 % (ref 11.5–15.5)
WBC: 5.4 10*3/uL (ref 4.0–10.5)

## 2017-08-06 LAB — BASIC METABOLIC PANEL
Anion gap: 8 (ref 5–15)
BUN: 10 mg/dL (ref 6–20)
CO2: 26 mmol/L (ref 22–32)
Calcium: 9.2 mg/dL (ref 8.9–10.3)
Chloride: 108 mmol/L (ref 98–111)
Creatinine, Ser: 0.58 mg/dL (ref 0.44–1.00)
GFR calc Af Amer: >60 mL/min (ref >60)
GFR calc non Af Amer: >60 mL/min (ref >60)
Glucose, Bld: 97 mg/dL (ref 70–99)
Potassium: 4 mmol/L (ref 3.5–5.1)
Sodium: 142 mmol/L (ref 135–145)

## 2017-08-06 LAB — URINALYSIS, ROUTINE W REFLEX MICROSCOPIC
Bilirubin Urine: NEGATIVE
Glucose, UA: NEGATIVE mg/dL
Hgb urine dipstick: NEGATIVE
Ketones, ur: NEGATIVE mg/dL
Leukocytes, UA: NEGATIVE
Nitrite: NEGATIVE
Protein, ur: NEGATIVE mg/dL
Specific Gravity, Urine: 1.017 (ref 1.005–1.030)
pH: 6 (ref 5.0–8.0)

## 2017-08-06 LAB — CBG MONITORING, ED: Glucose-Capillary: 93 mg/dL (ref 70–99)

## 2017-08-06 MED ORDER — MECLIZINE HCL 12.5 MG PO TABS
25.0000 mg | ORAL_TABLET | Freq: Once | ORAL | Status: AC
  Administered 2017-08-06: 25 mg via ORAL
  Filled 2017-08-06: qty 2

## 2017-08-06 MED ORDER — ONDANSETRON 4 MG PO TBDP: 4.0000 mg | ORAL_TABLET | Freq: Three times a day (TID) | ORAL | 0 refills | Status: DC | PRN

## 2017-08-06 MED ORDER — MECLIZINE HCL 25 MG PO TABS: 25.0000 mg | ORAL_TABLET | Freq: Three times a day (TID) | ORAL | 0 refills | Status: DC | PRN

## 2017-08-06 NOTE — Telephone Encounter (Signed)
She called in c/o being very dizzy.  "I'm laying in the floor so dizzy I can't get up".   I instructed her to call 911 immediately.   She said she would.   I will call back to check on her in 5 minutes.  I attempted to call her back at 9:55am but the line was busy.    I will try again shortly.  I called her again at 9:58 and got in touch with her.   EMS is on the way.   Her niece who lives "across the yard" is on her way over now.    She is still laying in the floor.   I asked her if she had had this happen before.   She said yes and it was vertigo at that time.    She was able to get the door unlocked so EMS could get in but is still laying in the floor.   I instructed her to stay there until EMS got there so she would not get hurt trying to get up and walk.    She verbalized understanding and agreed to stay there.   "My niece is coming over now too".  I then let her go.   "I'm ok until my niece gets here".  I routed a note to Dr. Jonni Sanger making her aware.       Reason for Disposition . Sounds like a life-threatening emergency to the triager  Answer Assessment - Initial Assessment Questions 1. DESCRIPTION: "Describe your dizziness."     "I'm laying on the floor so dizzy I can't get up".   I instructed her to call 911 immediately.    I asked if she was having a seizure (history of epilepsy in [place]).   She denied having a seizure. 2. LIGHTHEADED: "Do you feel lightheaded?" (e.g., somewhat faint, woozy, weak upon standing)     See above 3. VERTIGO: "Do you feel like either you or the room is spinning or tilting?" (i.e. vertigo)     *No Answer* 4. SEVERITY: "How bad is it?"  "Do you feel like you are going to faint?" "Can you stand and walk?"   - MILD - walking normally   - MODERATE - interferes with normal activities (e.g., work, school)    - SEVERE - unable to stand, requires support to walk, feels like passing out now.      Laying in the floor unable to get up. 5. ONSET:  "When did the  dizziness begin?"     *No Answer* 6. AGGRAVATING FACTORS: "Does anything make it worse?" (e.g., standing, change in head position)     *No Answer* 7. HEART RATE: "Can you tell me your heart rate?" "How many beats in 15 seconds?"  (Note: not all patients can do this)       *No Answer* 8. CAUSE: "What do you think is causing the dizziness?"     *No Answer* 9. RECURRENT SYMPTOM: "Have you had dizziness before?" If so, ask: "When was the last time?" "What happened that time?"     *No Answer* 10. OTHER SYMPTOMS: "Do you have any other symptoms?" (e.g., fever, chest pain, vomiting, diarrhea, bleeding)       *No Answer* 11. PREGNANCY: "Is there any chance you are pregnant?" "When was your last menstrual period?"       *No Answer*  Protocols used: DIZZINESS Heidi Dach

## 2017-08-06 NOTE — Telephone Encounter (Signed)
Noted  

## 2017-08-06 NOTE — ED Triage Notes (Signed)
Pt was brought in by EMS due to dizziness for 2days. EMS report pt was laying in floor flailing in floor when they arrived. Pt reports that dizziness is worse when sitting up. While pt was being triaged she had a 10 second syncope. Responded to pain at that time. Pt alert and awake at this time. Crying due to death of husband 3 weeks ago

## 2017-08-06 NOTE — ED Provider Notes (Signed)
[facility] EMERGENCY DEPARTMENT Provider Note   CSN: [id number] Arrival date & time: 08/06/17  1059     History   Chief Complaint Chief Complaint  Patient presents with  . Dizziness    HPI [name] is a 52 y.o. female.  HPI  The patient is a 52 year old female, she has a history of multiple medical problems including epilepsy, eczema, depression, chronic arthritis as well as a history of nonepileptic convulsions.   Evidently the patient lost her husband 3 weeks ago, she has been tearful, she was found on the floor of her home distraught stating that every time she stands up she gets dizzy.  The paramedics report that she had a syncopal episode upon trying to set her up, no other findings prehospital by paramedic staff.  The patient reports to me that she was at the table trying to write checks for her bills, when she tried to stand up she became vertiginous feeling like the room was spinning, like she had no balance, she collapsed to the ground.  She denies hitting her head or any other injuries but states that every time she sits up she has this.  Is been going on for a couple of days but today was much worse.  She does not have symptoms when she is lying flat.  She has no associated palpitations chest pain shortness of breath swelling of the leg numbness weakness headache or blurred vision.  At this time she is asymptomatic when she is laying semirecumbent in the bed.  Past Medical History:  Diagnosis Date  . Arthritis    "knees" (04/22/2012)  . Chronic bronchitis ([place])    "yearly; when the weather changes" (04/22/2012)  . Colon polyp   . Colon polyps    adenomatous and hyperplastic-  . Depression   . Eczema   . Epilepsy ([place])    "been having them right often here lately" (04/22/2012)  . Fatty liver   . High cholesterol   . History of kidney stones   . Osteoarthritis    [name] 04/22/2012  . Other convulsions 05/21/12   non-epileptic spells  . Restless leg   .  Seizures ([place])   . Vertigo     Patient Active Problem List   Diagnosis Date Noted  . Other social stressor 06/09/2017  . Sleep apnea 06/09/2017  . DOE (dyspnea on exertion) 06/09/2017  . Colon polyp 06/05/2017  . Elevated liver function tests 06/05/2017  . Fatty liver   . Mild neurocognitive disorder 02/04/2017  . 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 Bereavement 12/25/2015  . Partial symptomatic epilepsy with complex partial seizures, not intractable, with status epilepticus ([place]) 07/24/2015  . Insomnia w/ sleep apnea 07/24/2015  . OA (osteoarthritis) of knee 05/07/2015  . Memory loss 02/20/2014  . 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 Seizure disorder ([place]) 07/17/2011  . Major depressive disorder, single episode 10/14/2010    Past Surgical History:  Procedure Laterality Date  . ABDOMINAL HYSTERECTOMY  2001  . BLADDER SUSPENSION    . BUNIONECTOMY Left 2000  . CESAREAN SECTION  1987; 1988  . COLONOSCOPY    . JOINT REPLACEMENT    . MASS EXCISION  10/22/2011   Procedure: EXCISION MASS;  Surgeon: [name], MD;  Location: [place];  Service: General;  Laterality: Right;  excision right buttock mass  . TOTAL HIP ARTHROPLASTY Left 1993; 1995; 2000  . TOTAL KNEE ARTHROPLASTY Left 05/07/2015   Procedure: TOTAL LEFT KNEE ARTHROPLASTY;  Surgeon: [name], MD;  Location: [facility];  Service: Orthopedics;  Laterality: Left;  . TOTAL KNEE ARTHROPLASTY Right 10/01/2015   Procedure: RIGHT TOTAL KNEE ARTHROPLASTY;  Surgeon: [name], MD;  Location: [facility];  Service: Orthopedics;  Laterality: Right;     OB History   None      Home Medications    Prior to Admission medications   Medication Sig Start Date End Date Taking? Authorizing Provider  FLUoxetine HCl 60 MG TABS Take 60 mg by mouth daily. 07/23/17   Norman Clay, MD  gabapentin (NEURONTIN) 600 MG tablet Take 1 tablet (600 mg total) by mouth 3 (three) times daily. 02/03/17   [name], MD  lamoTRIgine (LAMICTAL) 150 MG  tablet Take 1 tablet (150 mg total) by mouth 2 (two) times daily. 07/20/17 08/19/17  [name], NP  levETIRAcetam (KEPPRA) 1000 MG tablet Take 1 tablet PO in the morning and 1.5 tablets at bedtime 04/15/17   [name], NP  LORazepam (ATIVAN) 1 MG tablet 1-1.5 mg daily as needed for anxiety 07/23/17   Norman Clay, MD  meclizine (ANTIVERT) 25 MG tablet Take 1 tablet (25 mg total) by mouth 3 (three) times daily as needed for dizziness. 08/06/17   Noemi Chapel, MD  mirtazapine (REMERON) 15 MG tablet Take 0.5 tablets (7.5 mg total) by mouth at bedtime. 07/23/17   Norman Clay, MD  ondansetron (ZOFRAN ODT) 4 MG disintegrating tablet Take 1 tablet (4 mg total) by mouth every 8 (eight) hours as needed for nausea. 08/06/17   Noemi Chapel, MD  pramipexole (MIRAPEX) 0.5 MG tablet Take 1 tablet (0.5 mg total) by mouth 2 (two) times daily. 02/03/17   [name], MD    Family History Family History  Problem Relation Age of Onset  . Cancer Mother   . Depression Father   . Cancer Brother   . Diabetes Brother   . Cancer Maternal Aunt   . Diabetes Maternal Aunt   . Bipolar disorder Son   . Drug abuse Son   . Heart attack Sister 24       during severe illness    Social History Social History   Tobacco Use  . Smoking status: Never Smoker  . Smokeless tobacco: Never Used  Substance Use Topics  . Alcohol use: No    Alcohol/week: 0.0 oz    Comment: 12-25-2015 per pt no  . Drug use: No    Comment: 21-12-2015 per pt no      Allergies   Acetaminophen; Benadryl [diphenhydramine hcl]; Codeine; Dilantin [phenytoin sodium extended]; Melatonin; Ultram [tramadol hcl]; Vimpat [lacosamide]; Betadine [povidone iodine]; Latex; Penicillins; Sulfa antibiotics; Tape; and Vicodin [hydrocodone-acetaminophen]   Review of Systems Review of Systems  All other systems reviewed and are negative.    Physical Exam Updated Vital Signs BP (!) 126/94 (BP Location: Left Arm)   Pulse 100   Temp 98.3 F  (36.8 C) (Oral)   Resp 16   Ht 5\' 5"  (1.651 m)   Wt 70.3 kg (155 lb)   SpO2 97%   BMI 25.79 kg/m   Physical Exam  Constitutional: She appears well-developed and well-nourished. No distress.  HENT:  Head: Normocephalic and atraumatic.  Mouth/Throat: Oropharynx is clear and moist. No oropharyngeal exudate.  Eyes: Pupils are equal, round, and reactive to light. Conjunctivae and EOM are normal. Right eye exhibits no discharge. Left eye exhibits no discharge. No scleral icterus.  Neck: Normal range of motion. Neck supple. No JVD present. No thyromegaly present.  Cardiovascular: Normal rate, regular rhythm,  normal heart sounds and intact distal pulses. Exam reveals no gallop and no friction rub.  No murmur heard. Pulmonary/Chest: Effort normal and breath sounds normal. No respiratory distress. She has no wheezes. She has no rales.  Abdominal: Soft. Bowel sounds are normal. She exhibits no distension and no mass. There is no tenderness.  Musculoskeletal: Normal range of motion. She exhibits no edema or tenderness.  Lymphadenopathy:    She has no cervical adenopathy.  Neurological: She is alert. Coordination normal.  Speech is clear, cranial nerves III through XII are intact, memory is intact, strength is normal in all 4 extremities including grips, sensation is intact to light touch and pinprick in all 4 extremities. Coordination as tested by finger-nose-finger is normal, no limb ataxia.normal reflexes at the patellar tendons bilaterally.  Skin: Skin is warm and dry. No rash noted. No erythema.  Psychiatric: She has a normal mood and affect. Her behavior is normal.  Nursing note and vitals reviewed.    ED Treatments / Results  Labs (all labs ordered are listed, but only abnormal results are displayed) Labs Reviewed  CBC - Abnormal; Notable for the following components:      Result Value   Hemoglobin 11.3 (*)    HCT 34.8 (*)    All other components within normal limits  URINALYSIS,  ROUTINE W REFLEX MICROSCOPIC - Abnormal; Notable for the following components:   APPearance CLOUDY (*)    All other components within normal limits  BASIC METABOLIC PANEL  CBG MONITORING, ED    EKG EKG Interpretation  Date/Time:  Thursday August 06 2017 11:14:32 EDT Ventricular Rate:  80 PR Interval:    QRS Duration: 99 QT Interval:  386 QTC Calculation: 446 R Axis:   59 Text Interpretation:  Sinus rhythm Normal ECG since last tracing no significant change Confirmed by Noemi Chapel [phone]) on 08/06/2017 11:39:46 AM   Radiology No results found.  Procedures Procedures (including critical care time)  Medications Ordered in ED Medications - No data to display   Initial Impression / Assessment and Plan / ED Course  I have reviewed the triage vital signs and the nursing notes.  Pertinent labs & imaging results that were available during my care of the patient were reviewed by me and considered in my medical decision making (see chart for details).  Clinical Course as of Aug 06 1313  Thu Aug 06, 2017  1313 Labs are totally normal, the patient has been asymptomatic, vitals are reassuring, stable for discharge.   [BM]    Clinical Course User Index [BM] Noemi Chapel, MD    The patient's exam is rather unremarkable, she does not have any nystagmus.  When she does get to a upright position she becomes symptomatically vertiginous.  This is all very consistent with a vertigo.  When I look in her ears they both are normal without any obstructions, tympanic membranes are clear without effusions.  She does complain of right ear pain for the last few days as well.  There is no tenderness.  No focal neurologic symptoms to suggest an underlying central cause of this vertigo which seems positional.  Final Clinical Impressions(s) / ED Diagnoses   Final diagnoses:  Vertigo    ED Discharge Orders        Ordered    meclizine (ANTIVERT) 25 MG tablet  3 times daily PRN     08/06/17 1314     ondansetron (ZOFRAN ODT) 4 MG disintegrating tablet  Every 8 hours PRN  08/06/17 1315       Noemi Chapel, MD 08/06/17 1315

## 2017-08-06 NOTE — Discharge Instructions (Addendum)
Meclizine as needed for vertigo Zofran for nausea ER for worsening symptoms.

## 2017-08-07 ENCOUNTER — Encounter: Payer: Self-pay | Admitting: Family Medicine

## 2017-08-07 DIAGNOSIS — Z634 Disappearance and death of family member: Secondary | ICD-10-CM | POA: Insufficient documentation

## 2017-08-10 ENCOUNTER — Encounter: Payer: Self-pay | Admitting: *Deleted

## 2017-08-16 ENCOUNTER — Other Ambulatory Visit: Payer: Self-pay | Admitting: Family Medicine

## 2017-08-17 ENCOUNTER — Other Ambulatory Visit: Payer: Self-pay | Admitting: *Deleted

## 2017-08-17 NOTE — Patient Outreach (Signed)
Called pt's daughter today, [name], to see if she had received our [facility] information and if I could schedule a home visit. I did not reach her but requested a return call.   I will call again in a few days if I do not hear from her.  [name], MSN, [facility] Gerontological Nurse Practitioner [facility] [phone]

## 2017-08-19 ENCOUNTER — Ambulatory Visit (INDEPENDENT_AMBULATORY_CARE_PROVIDER_SITE_OTHER): Payer: PPO | Admitting: Neurology

## 2017-08-19 ENCOUNTER — Encounter: Payer: Self-pay | Admitting: Neurology

## 2017-08-19 VITALS — BP 111/76 | HR 99 | Ht 60.0 in | Wt 158.0 lb

## 2017-08-19 DIAGNOSIS — R4789 Other speech disturbances: Secondary | ICD-10-CM | POA: Diagnosis not present

## 2017-08-19 DIAGNOSIS — F4321 Adjustment disorder with depressed mood: Secondary | ICD-10-CM

## 2017-08-19 DIAGNOSIS — R569 Unspecified convulsions: Secondary | ICD-10-CM

## 2017-08-19 MED ORDER — PRAMIPEXOLE DIHYDROCHLORIDE 0.5 MG PO TABS: 0.5000 mg | ORAL_TABLET | Freq: Two times a day (BID) | ORAL | 5 refills | Status: DC

## 2017-08-19 MED ORDER — LEVETIRACETAM 1000 MG PO TABS: 1000.0000 mg | ORAL_TABLET | Freq: Two times a day (BID) | ORAL | 5 refills | Status: DC

## 2017-08-19 NOTE — Patient Instructions (Signed)
Complicated Grieving Grief is a normal response to the death of someone close to you. Feelings of fear, anger, and guilt can affect almost everyone who loses a loved one. It is also common to have symptoms of depression while you are grieving. These include problems with sleep, loss of appetite, and lack of energy. They may last for weeks or months after a loss. Complicated grief is different from normal grief or depression. Normal grieving involves sadness and feelings of loss, but these feelings are not constant. Complicated grief is a constant and severe type of grief. It interferes with your ability to function normally. It may last for several months to a year or longer. Complicated grief may require treatment from a mental health care provider. What are the causes? It is not known why some people continue to struggle with grief and others do not. You may be at higher risk for complicated grief if:  The death of your loved one was sudden or unexpected.  The death of your loved one was due to a violent event.  Your loved one committed suicide.  Your loved one was a child or a young person.  You were very close to or dependent on the loved one.  You have a history of depression.  What are the signs or symptoms? Signs and symptoms of complicated grief may include:  Feeling disbelief or numbness.  Being unable to enjoy good memories of your loved one.  Needing to avoid anything that reminds you of your loved one.  Being unable to stop thinking about the death.  Feeling intense anger or guilt.  Feeling alone and hopeless.  Feeling that your life is meaningless and empty.  Losing the desire to live.  How is this diagnosed? Your health care provider may diagnose complicated grief if:  You have constant symptoms of grief for 6-12 months or longer.  Your symptoms are interfering with your ability to live your life.  Your health care provider may want you to see a mental health  care provider. Many symptoms of depression are similar to the symptoms of complicated grief. It is important to be evaluated for complicated grief along with other mental health conditions. How is this treated? Talk therapy with a mental health provider is the most common treatment for complicated grief. During therapy, you will learn healthy ways to cope with the loss of your loved one. In some cases, your mental health care provider may also recommend antidepressant medicines. Follow these instructions at home:  Take care of yourself. ? Eat regular meals and maintain a healthy diet. Eat plenty of fruits, vegetables, and whole grains. ? Try to get some exercise each day. ? Keep regular hours for sleep. Try to get at least 8 hours of sleep each night.  Do not use drugs or alcohol to ease your symptoms.  Take medicines only as directed by your health care provider.  Spend time with friends and loved ones.  Consider joining a grief (bereavement) support group to help you deal with your loss.  Keep all follow-up visits as directed by your health care provider. This is important. Contact a health care provider if:  Your symptoms keep you from functioning normally.  Your symptoms do not get better with treatment. Get help right away if:  You have serious thoughts of hurting yourself or someone else.  You have suicidal feelings. This information is not intended to replace advice given to you by your health care provider. Make sure you   discuss any questions you have with your health care provider. Document Released: 12/30/2004 Document Revised: 06/07/2015 Document Reviewed: 06/09/2013 Elsevier Interactive Patient Education  2018 [employer]  

## 2017-08-19 NOTE — Progress Notes (Signed)
PATIENT: [name] DOB: 04/28/1965  REASON FOR VISIT: follow up- seizures HISTORY FROM: patient  HISTORY OF PRESENT ILLNESS:    08-19-2017, RV . [name] is a 52 year old Caucasian female patient, recently widowed ( 08-11-2017) after 59 years of marriage. She is grieving, her husband had CHF and PE.  With the passing of her husband her spells have increased, her stress level has increased.  In her last visit with [name] here in the office be referred to [name] for an EMU admission. She had one day in EMU - May 1st 2019. Her daughter reports that after the EEG was removed she had the 'big seizures " and [facility]. She had no family member with her - and that may have been an added difficulty.  Referral to psychology/ psychiatry . I explained that I need not to follow up with her, as I have nothing else to offer and her insomnia is secondary to pain.    Report from [name] - Dr. Coralyn [name]. 05-12-2017,  52 y.o. female w/ above documented PMHx/PSHx who presents to the ED for seizure like activity. MD that presented with pt was concerned about grand mal activity however pt had no tongue biting, no incontinence and no postictal confusion. She was tired but may also now be 2/2 ativan administration. In any case, pt has a hx on recent EMU admission of having had non- epileptic spells and feel that in the setting of her rapid improvement after mx grand mal seizure that this is more likely to be a non epileptic spell than seizures concerning for status since-  she otherwise reports feeling at her baseline (minus the fatigue). She reports compliance with her home AED regimen.  In the absence of fevers, HA, neck pain or any other unexpected finding after a seizure, since she has a hx of seizures, will defer further evaluation at this time--including no bloodwork or CT imaging.  Pt was observed and after resting for a while reported feeling at her baseline.    She reports that the recent stress of her husbands illness is the likely trigger for her spells.  She was able to ambulate and tolerate PO without difficulty. They are amendable to d/c and have no other complaints or concerns. Usual and customary return precautions for seizure like activity was discussed with PT. All questions answered. PT stated understanding and agreement with plan. Encouraged PCP follow up as needed and adherence to seizure precautions. Pt and VS stable at d/c.  Patient went to ED on 07-12-2017 once more , was briefly observed and released.    MM- The patient continues to have seizure events.  It is unclear whether these are always epileptic events or pseudoseizures.  I will try increasing Keppra to thousand milligrams in the morning and 1500 mg in the evening.  If this causes excessive drowsiness she will let us know.  She will remain on Lamictal 150 mg twice a day.  I will check blood work today.  The patient will be referred to [name] for possible EMU admission.  The patient's last EMU admit was in 2004 and it demonstrated both epileptic events as well as pseudoseizures.  I will send the patient for reevaluation.  Patient is amenable to this plan.  She will call if her symptoms worsen.   45 -28-2018, [name] has recently been prescribed trazodone to help her sleep per her psychiatrist. She has seen [name]  for intractable seizures and a visit about 2 months ago. She continues to take Lamictal, she continues to take Celexa. 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 She reports it is a pain that keeps her from sleeping now. [name] treats her for her orthopedic symptoms, she also reports that she tried melatonin which has failed, has tried Ambien which causes parasomnia. Her psychiatrist is weaning her off Klonopin. Her psychiatrist in [place]  is weaning her off- she has been taking it for 7 years, started under Dr [name].   I explained that I need not to follow up with her, as I have nothing else to offer and her insomnia is secondary to pain.    [name] is a 52 year old female with a history of seizures, for years was [name] patient.  She reports that she had a seizure last week, Saturday.  She states that she's been taking Keppra and Lamictal consistently excpet for Friday night- the missed dose may have provoked the seizure. . Denies missing any medication. She states that after hitting her head she suffered a headache for 2 days however that has resolved. The patient does not operate a motor vehicle. At home she is able to complete all ADLs independent. She is no longer on narcotic medication. She reports that she will be having a second  knee replacement in 4 month. [name] has done well with her last full knee replacement in April of this year, she had a hip replacement already 17 years ago, both by [name]. [name] reports that she has insomnia problems. There is also a report that her husband is very hard of hearing to the TV is very loud during the day and communication at times difficult. She has regular bedtimes between 9 and 10 PM, she will fall asleep at she will wake up around 2 AM and has great difficulties to resume sleep. She could use melatonin 3 mg at night as a natural supplement to help her stay asleep longer, if this should be to no avail I would consider a low-dose of Klonopin. In a seizure patient I would not like to use Ambien as most benzodiazepines have at least an additional seizure controlling affect. She also has a history of parasomnia / sleepwalking. She usually sleeps on her side, she sleeps on one pillow. The bedroom is cool, quiet and dark. She has a TV in the bedroom but cuts it off when she goes to bed. She is not sure what wakes her at 2 in the morning but it seems to be in early morning ritual and is very difficult for her to reinitiate sleep. Since her husband has noted  her to snore but we have no reliable source reporting apnea, I will order an attended sleep study to test her.   HISTORY 02/01/2015 (MM): [name] is a 51 female with a history of seizures. She returns today for an evaluation. The patient had several seizures and was eventually taken to the emergency room. The patient did improve once at the emergency room. She did not have to be intubated. Her Keppra was increased to 1000 mg twice a day. The patient continues on Lamictal. Since then the patient has not had any additional seizures. The patient's sister-in-law brought to my attention in private that she feels that the patient is taking pain medicine inappropriately. She is wondering if this was the cause of some of her symptoms/seizures when  she went to the emergency room. She states that her physicians continue to give her pain medicine for her knee pain even after they have called and requested that she not be given this. Patient reports that she is able to complete ADLs independently. Patient does report ongoing depression. In the past she has been asked to follow-up with mental health however the patient has failed to do this. She is currently on Celexa.She denies any new neurological symptoms. She returns today for an evaluation.  HISTORY 08/21/14 [facility]):  [name] is here today for follow-up on her subjective memory complaints. A [place] cognitive assessment test was performed today in which she failed on the Trail Making Test and was unable to copy a cube. The contour of her clock was also father droopy but she placed the numbers and hands correctly. She was able to name objects she did very well in all parts of attention testing including serial sevens and repeated the complicated sentences her word fluency however was limited she only generated 8 words she was good as obstruction and fully oriented to place time and date her delayed recall testing showed 4 out of 5 delayed words to be  correctly remembered this is a very good result she scored 26 out of 30 points. Patient is high school educated and this would be an normal score at 26-30.  We discussed anecdotal evidence for her spells- her husband reported that a couple of times they have been to the grocery store and she needed to sit down for moment she seems to be mostly claustrophobic when surrounded by a lot of people. This may be more of an anxiety attack. Reports some insomnia, mostly related to being worried about her meanwhile 94 year old son. When she hasn't had a good night sleep she is more likely to be hypersensitive to claustrophobia or to other spells of anxiety or panic. No other "seizures " on the current medication.  REVIEW OF SYSTEMS: Out of a complete 14 system review of symptoms, the patient complains only of the following symptoms, and all other reviewed systems are negative.  Constipation, leg swelling, restless leg, joint pain, aching muscles, walking difficulty, headache, seizures, agitation, depression  ALLERGIES: Allergies  Allergen Reactions  . Acetaminophen Other (See Comments)    Has fatty deposits on liver  . Benadryl [Diphenhydramine Hcl] Other (See Comments)    hyperactivity and seizures  . Codeine Itching and Rash    seizures  . Dilantin [Phenytoin Sodium Extended] Other (See Comments)    Elevated LFT's  . Melatonin Other (See Comments)    seizures  . Ultram [Tramadol Hcl] Other (See Comments)    Seizures  . Vimpat [Lacosamide] Other (See Comments)    Severe dizziness  . Betadine [Povidone Iodine] Rash  . Latex Rash  . Penicillins Itching and Rash    Has patient had a PCN reaction causing immediate rash, facial/tongue/throat swelling, SOB or lightheadedness with hypotension: No Has patient had a PCN reaction causing severe rash involving mucus membranes or skin necrosis: No Has patient had a PCN reaction that required hospitalization No Has patient had a PCN reaction occurring  within the last 10 years: No If all of the above answers are "NO", then may proceed with Cephalosporin use.  . Sulfa Antibiotics Rash  . Tape Rash    Paper tape please  . Vicodin [Hydrocodone-Acetaminophen] Itching    HOME MEDICATIONS: Outpatient Medications Prior to Visit  Medication Sig Dispense Refill  . FLUoxetine HCl 60 MG TABS  Take 60 mg by mouth daily. 90 tablet 0  . gabapentin (NEURONTIN) 600 MG tablet Take 1 tablet (600 mg total) by mouth 3 (three) times daily. 90 tablet 5  . lamoTRIgine (LAMICTAL) 150 MG tablet Take 1 tablet (150 mg total) by mouth 2 (two) times daily. 60 tablet 5  . levETIRAcetam (KEPPRA) 1000 MG tablet Take 1 tablet PO in the morning and 1.5 tablets at bedtime 75 tablet 5  . LORazepam (ATIVAN) 1 MG tablet 1-1.5 mg daily as needed for anxiety 45 tablet 0  . mirtazapine (REMERON) 15 MG tablet Take 0.5 tablets (7.5 mg total) by mouth at bedtime. 45 tablet 0  . pramipexole (MIRAPEX) 0.5 MG tablet Take 1 tablet (0.5 mg total) by mouth 2 (two) times daily. 60 tablet 5  . meclizine (ANTIVERT) 25 MG tablet Take 1 tablet (25 mg total) by mouth 3 (three) times daily as needed for dizziness. 30 tablet 0  . ondansetron (ZOFRAN ODT) 4 MG disintegrating tablet Take 1 tablet (4 mg total) by mouth every 8 (eight) hours as needed for nausea. 10 tablet 0   No facility-administered medications prior to visit.     PAST MEDICAL HISTORY: Past Medical History:  Diagnosis Date  . Arthritis    "knees" (04/22/2012)  . Chronic bronchitis ([place])    "yearly; when the weather changes" (04/22/2012)  . Colon polyp   . Colon polyps    adenomatous and hyperplastic-  . Depression   . Eczema   . Epilepsy ([place])    "been having them right often here lately" (04/22/2012)  . Fatty liver   . High cholesterol   . History of kidney stones   . Osteoarthritis    [name] 04/22/2012  . Other convulsions 05/21/12   non-epileptic spells  . Restless leg   . Seizures ([place])   . Vertigo     PAST  SURGICAL HISTORY: Past Surgical History:  Procedure Laterality Date  . ABDOMINAL HYSTERECTOMY  2001  . BLADDER SUSPENSION    . BUNIONECTOMY Left 2000  . CESAREAN SECTION  1987; 1988  . COLONOSCOPY    . JOINT REPLACEMENT    . MASS EXCISION  10/22/2011   Procedure: EXCISION MASS;  Surgeon: [name], MD;  Location: [facility];  Service: General;  Laterality: Right;  excision right buttock mass  . TOTAL HIP ARTHROPLASTY Left 1993; 1995; 2000  . TOTAL KNEE ARTHROPLASTY Left 05/07/2015   Procedure: TOTAL LEFT KNEE ARTHROPLASTY;  Surgeon: [name], MD;  Location: [facility];  Service: Orthopedics;  Laterality: Left;  . TOTAL KNEE ARTHROPLASTY Right 10/01/2015   Procedure: RIGHT TOTAL KNEE ARTHROPLASTY;  Surgeon: [name], MD;  Location: [facility];  Service: Orthopedics;  Laterality: Right;    FAMILY HISTORY: Family History  Problem Relation Age of Onset  . Cancer Mother   . Depression Father   . Cancer Brother   . Diabetes Brother   . Cancer Maternal Aunt   . Diabetes Maternal Aunt   . Bipolar disorder Son   . Drug abuse Son   . Heart attack Sister 69       during severe illness    SOCIAL HISTORY: Social History   Socioeconomic History  . Marital status: Widowed    Spouse name: [name]  . Number of children: 2  . Years of education: 12 +  . Highest education level: Not on file  Occupational History  . Occupation: disability, medical  Social Needs  . Financial resource strain: Not on file  .  Food insecurity:    Worry: Not on file    Inability: Not on file  . Transportation needs:    Medical: Not on file    Non-medical: Not on file  Tobacco Use  . Smoking status: Never Smoker  . Smokeless tobacco: Never Used  Substance and Sexual Activity  . Alcohol use: No    Alcohol/week: 0.0 oz    Comment: 12-25-2015 per pt no  . Drug use: No    Comment: 21-12-2015 per pt no   . Sexual activity: Not Currently    Birth control/protection: Surgical  Lifestyle  . Physical  activity:    Days per week: Not on file    Minutes per session: Not on file  . Stress: Not on file  Relationships  . Social connections:    Talks on phone: Not on file    Gets together: Not on file    Attends religious service: Not on file    Active member of club or organization: Not on file    Attends meetings of clubs or organizations: Not on file    Relationship status: Not on file  . Intimate partner violence:    Fear of current or ex partner: Not on file    Emotionally abused: Not on file    Physically abused: Not on file    Forced sexual activity: Not on file  Other Topics Concern  . Not on file  Social History Narrative   Patient is married [name]) and lives at home with her husband who is seriously ill.    Patient has two adult children.   Patient is disabled.   Patient has a high school education and some trade school.   Patient is right-handed.   Patient drinks four glasses of soda and tea daily.      PHYSICAL EXAM  Vitals:   08/19/17 1003  BP: 111/76  Pulse: 99  Weight: 158 lb (71.7 kg)  Height: 5' (1.524 m)   Body mass index is 30.86 kg/m.  Generalized: Well developed, in no acute distress - slurred speech , dysphasia- and regressive behavior.  Childlike, needy. Also tearful and certainly under  distress by her husbands death.    Neck circumference is 14.5 inches, the patient's Mallampati is grade 3- her uvula is midline but doesn't lift completely up past the tongue ground, she does have symmetric facial movements, she has a mild ptosis of the right eye, long standing.  Neurological examination Mentation: Alert oriented to time, place, history taking.  Follows all commands language fluent. Patient's speech is slightly slurred.  She states that this is because she does not have her dentures in- family confirms that she has spoken like this for years.   CN: ptosis on the right ,full EOM- no nystagmus. Symmetric facial strength , sensation intact. Tongue  and uvula midline.   She has equal grip strength, full ROM for neck and shoulder.  She has very brisk DTRs , hyperreflexia in lower extremities.   Gait intact.      DIAGNOSTIC DATA (LABS, IMAGING, TESTING) - I reviewed patient records, labs, notes, testing and imaging myself where available.  EMU notes.  ED notes.    ASSESSMENT AND PLAN:  52 y.o. year old female, looking older than her numeric age, here after another "seizure". 30 minutes visit.  Reviewed EMU notes, EEG and ED notes- all suggestive of non epileptic seizures.   She  has a past medical history of Arthritis, Chronic bronchitis ([place]), Colon polyp, Colon  polyps, Depression, Eczema, Epilepsy ([place]), Fatty liver, High cholesterol, History of kidney stones, Osteoarthritis, Other convulsions (05/21/12), Restless leg, Seizures ([place]), and Vertigo. here with:    1. Grief reaction- high stress . 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 Recommend grief counseling- hospice in [place]. Their son is in prison, uses CPAP. She had total knee replacement in April and plans the other knee to be replaced in October, had a lot of pain. [name].   2. Non epileptic seizures-I explained that I need not to follow up with her, as I have nothing else to offer and her insomnia is secondary to pain.  She resumed all her seizure medicines after only 1 day of EMU stay, in addition she is taking sleep medicine Remeron, Mirapex for restless legs, Ativan 1.5 mg as needed for anxiety at night.  PS : Dr [name] had followed her for years, assuming she would have 2 or more seizure foci and possibly additional non-epileptic spells. She had been referred for EMU.  I will continue Keppra 500 mg to 3 tablets in the morning and 3 tablets in the evening. She will continue on Lamictal 100 mg twice a day as this medication is also mood stabilizing. Her last spell/ seizure was  08-13-2017. RLS medication filled by PCP, new PCP is Billey Chang,  MD     [name], MD    08/19/2017, 10:34 AM [facility] [address], [place], [place] [address] [phone]

## 2017-08-21 ENCOUNTER — Emergency Department (HOSPITAL_COMMUNITY)
Admission: EM | Admit: 2017-08-21 | Discharge: 2017-08-21 | Disposition: A | Discharge status: Home / Self Care | Payer: PPO | Attending: Emergency Medicine | Admitting: Emergency Medicine

## 2017-08-21 ENCOUNTER — Other Ambulatory Visit: Payer: Self-pay | Admitting: *Deleted

## 2017-08-21 ENCOUNTER — Encounter (HOSPITAL_COMMUNITY): Payer: Self-pay | Admitting: *Deleted

## 2017-08-21 DIAGNOSIS — E7800 Pure hypercholesterolemia, unspecified: Secondary | ICD-10-CM | POA: Diagnosis not present

## 2017-08-21 DIAGNOSIS — Z79899 Other long term (current) drug therapy: Secondary | ICD-10-CM | POA: Diagnosis not present

## 2017-08-21 DIAGNOSIS — R569 Unspecified convulsions: Secondary | ICD-10-CM | POA: Diagnosis not present

## 2017-08-21 DIAGNOSIS — R51 Headache: Secondary | ICD-10-CM | POA: Diagnosis not present

## 2017-08-21 DIAGNOSIS — I959 Hypotension, unspecified: Secondary | ICD-10-CM | POA: Diagnosis not present

## 2017-08-21 DIAGNOSIS — Z91040 Latex allergy status: Secondary | ICD-10-CM | POA: Insufficient documentation

## 2017-08-21 DIAGNOSIS — G40909 Epilepsy, unspecified, not intractable, without status epilepticus: Secondary | ICD-10-CM | POA: Diagnosis not present

## 2017-08-21 DIAGNOSIS — R319 Hematuria, unspecified: Secondary | ICD-10-CM | POA: Insufficient documentation

## 2017-08-21 DIAGNOSIS — S90512A Abrasion, left ankle, initial encounter: Secondary | ICD-10-CM | POA: Diagnosis not present

## 2017-08-21 DIAGNOSIS — R000 Tachycardia, unspecified: Secondary | ICD-10-CM | POA: Diagnosis not present

## 2017-08-21 LAB — CBC WITH DIFFERENTIAL/PLATELET
Basophils Absolute: 0 10*3/uL (ref 0.0–0.1)
Basophils Relative: 0 %
Eosinophils Absolute: 0 10*3/uL (ref 0.0–0.7)
Eosinophils Relative: 1 %
HCT: 36.2 % (ref 36.0–46.0)
Hemoglobin: 11.6 g/dL — ABNORMAL LOW (ref 12.0–15.0)
Lymphocytes Relative: 27 %
Lymphs Abs: 1.6 10*3/uL (ref 0.7–4.0)
MCH: 28.3 pg (ref 26.0–34.0)
MCHC: 32 g/dL (ref 30.0–36.0)
MCV: 88.3 fL (ref 78.0–100.0)
Monocytes Absolute: 0.5 10*3/uL (ref 0.1–1.0)
Monocytes Relative: 8 %
Neutro Abs: 3.8 10*3/uL (ref 1.7–7.7)
Neutrophils Relative %: 64 %
Platelets: 233 10*3/uL (ref 150–400)
RBC: 4.1 MIL/uL (ref 3.87–5.11)
RDW: 14.9 % (ref 11.5–15.5)
WBC: 5.9 10*3/uL (ref 4.0–10.5)

## 2017-08-21 LAB — BASIC METABOLIC PANEL
Anion gap: 9 (ref 5–15)
BUN: 10 mg/dL (ref 6–20)
CO2: 25 mmol/L (ref 22–32)
Calcium: 8.8 mg/dL — ABNORMAL LOW (ref 8.9–10.3)
Chloride: 108 mmol/L (ref 98–111)
Creatinine, Ser: 0.69 mg/dL (ref 0.44–1.00)
GFR calc Af Amer: >60 mL/min (ref >60)
GFR calc non Af Amer: >60 mL/min (ref >60)
Glucose, Bld: 98 mg/dL (ref 70–99)
Potassium: 3.6 mmol/L (ref 3.5–5.1)
Sodium: 142 mmol/L (ref 135–145)

## 2017-08-21 LAB — URINALYSIS, ROUTINE W REFLEX MICROSCOPIC
Bilirubin Urine: NEGATIVE
Glucose, UA: NEGATIVE mg/dL
Ketones, ur: NEGATIVE mg/dL
Leukocytes, UA: NEGATIVE
Nitrite: NEGATIVE
Protein, ur: NEGATIVE mg/dL
RBC / HPF: >50 RBC/hpf — ABNORMAL HIGH (ref 0–5)
Specific Gravity, Urine: 1.02 (ref 1.005–1.030)
pH: 5 (ref 5.0–8.0)

## 2017-08-21 NOTE — Discharge Instructions (Signed)
You were evaluated in the emergency department after a possible seizure.  Your symptoms had improved by the time you were here and had some lab work that was unremarkable.  The only thing that was concerning was you had some blood in your urine but you did not have any symptoms.  You will need to follow-up with your doctors.  Please return if any concerns.

## 2017-08-21 NOTE — ED Triage Notes (Signed)
Brought in by EMS. C/o seizure activity, states they have become more frequent since death of husband.

## 2017-08-21 NOTE — ED Notes (Signed)
Pt attempted to urinate on the bedpan, not able to.

## 2017-08-21 NOTE — ED Notes (Addendum)
EMS reports pt has hx of pseudoseizures. Pt reports she takes Keppra and Lamictal for her seizures and has been compliant with the medication. Pt reports her husband died on 08/02/22 and she has been having more seizures since then. EMS reports when they arrived to scene pt was alert and oriented and not post ictal. Pt c/o soreness to left foot from fall. Pt has abrasions to left foot with full range of motion to left ankle.   While triaging pt she had approximately 20 seconds of full body convulsions, with HR increase to 120bpm and O2 sat decrease to 82%. Pt was rolled to her left side during convulsions. As soon as convulsions stopped, pt woke up and was alert and oriented x 4.

## 2017-08-21 NOTE — ED Provider Notes (Signed)
[facility] EMERGENCY DEPARTMENT Provider Note   CSN: [id number] Arrival date & time: 08/21/17  1156     History   Chief Complaint Chief Complaint  Patient presents with  . Seizures    HPI [name] is a 52 y.o. female.  She has a history of seizures and possibly non-epileptiform in nature.  She is complaining of having a seizure today while she was at the grocery store.  She said she had a funny feeling like she was going to have a seizure and then does not recall what happened next.  When I asked her what she thought provoked the seizure she said her husband had passed away and she has been under a lot of stress.  She states she has been taking her medications regular and just saw her neurologist a few days ago.  She denies any illness symptoms.  When asked her she felt like she had any injury she states she had scraped her left foot but it is not bothering her.  EMS reportedly had found her awake and alert and not postictal there.  Nurses hears that she had about 20 seconds of full body shaking which stopped spontaneously and then the patient was awake and alert immediately after.  The history is provided by the patient.  Seizures   This is a recurrent problem. The current episode started less than 1 hour ago. The problem has been resolved. There were 2 to 3 seizures. The most recent episode lasted less than 30 seconds. Associated symptoms include sleepiness. Pertinent negatives include no confusion, no headaches, no speech difficulty, no visual disturbance, no neck stiffness, no sore throat, no chest pain, no cough, no nausea, no vomiting and no diarrhea. Characteristics include rhythmic jerking. Characteristics do not include bladder incontinence or bit tongue. The episode was witnessed. There was the sensation of an aura present. Seizures continued in ED: reoccured x1. The seizure(s) had no focality. Possible causes do not include medication or dosage change, missed seizure meds,  recent illness or change in alcohol use. There has been no fever. There were no medications administered prior to arrival.    Past Medical History:  Diagnosis Date  . Arthritis    "knees" (04/22/2012)  . Chronic bronchitis ([place])    "yearly; when the weather changes" (04/22/2012)  . Colon polyp   . Colon polyps    adenomatous and hyperplastic-  . Depression   . Eczema   . Epilepsy ([place])    "been having them right often here lately" (04/22/2012)  . Fatty liver   . High cholesterol   . History of kidney stones   . Osteoarthritis    [name] 04/22/2012  . Other convulsions 05/21/12   non-epileptic spells  . Restless leg   . Seizures ([place])   . Vertigo     Patient Active Problem List   Diagnosis Date Noted  . Widowed - July 2019 08/07/2017  . Other social stressor 06/09/2017  . Sleep apnea 06/09/2017  . DOE (dyspnea on exertion) 06/09/2017  . Colon polyp 06/05/2017  . Elevated liver function tests 06/05/2017  . Fatty liver   . Mild neurocognitive disorder 02/04/2017  . 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 Bereavement 12/25/2015  . Partial symptomatic epilepsy with complex partial seizures, not intractable, with status epilepticus ([place]) 07/24/2015  . Insomnia w/ sleep apnea 07/24/2015  . OA (osteoarthritis) of knee 05/07/2015  . Memory loss 02/20/2014  . 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 Seizure disorder ([place]) 07/17/2011  . Major  depressive disorder, single episode 10/14/2010    Past Surgical History:  Procedure Laterality Date  . ABDOMINAL HYSTERECTOMY  2001  . BLADDER SUSPENSION    . BUNIONECTOMY Left 2000  . CESAREAN SECTION  1987; 1988  . COLONOSCOPY    . JOINT REPLACEMENT    . MASS EXCISION  10/22/2011   Procedure: EXCISION MASS;  Surgeon: [name], MD;  Location: [place];  Service: General;  Laterality: Right;  excision right buttock mass  . TOTAL HIP ARTHROPLASTY Left 1993; 1995; 2000  . TOTAL KNEE ARTHROPLASTY Left 05/07/2015   Procedure: TOTAL LEFT KNEE  ARTHROPLASTY;  Surgeon: [name], MD;  [facility];  Service: Orthopedics;  Laterality: Left;  . TOTAL KNEE ARTHROPLASTY Right 10/01/2015   Procedure: RIGHT TOTAL KNEE ARTHROPLASTY;  Surgeon: [name], MD;  Location: [facility];  Service: Orthopedics;  Laterality: Right;     OB History   None      Home Medications    Prior to Admission medications   Medication Sig Start Date End Date Taking? Authorizing Provider  FLUoxetine HCl 60 MG TABS Take 60 mg by mouth daily. 07/23/17   Norman Clay, MD  gabapentin (NEURONTIN) 600 MG tablet Take 1 tablet (600 mg total) by mouth 3 (three) times daily. 02/03/17   [name], MD  lamoTRIgine (LAMICTAL) 150 MG tablet Take 1 tablet (150 mg total) by mouth 2 (two) times daily. 07/20/17 08/19/17  [name], NP  levETIRAcetam (KEPPRA) 1000 MG tablet Take 1 tablet (1,000 mg total) by mouth 2 (two) times daily. Take 1 tablet PO in the morning and 1.5 tablets at bedtime 08/19/17   Dohmeier, Asencion Partridge, MD  LORazepam (ATIVAN) 1 MG tablet 1-1.5 mg daily as needed for anxiety 07/23/17   Norman Clay, MD  mirtazapine (REMERON) 15 MG tablet Take 0.5 tablets (7.5 mg total) by mouth at bedtime. 07/23/17   Norman Clay, MD  pramipexole (MIRAPEX) 0.5 MG tablet Take 1 tablet (0.5 mg total) by mouth 2 (two) times daily. 08/19/17   Dohmeier, Asencion Partridge, MD    Family History Family History  Problem Relation Age of Onset  . Cancer Mother   . Depression Father   . Cancer Brother   . Diabetes Brother   . Cancer Maternal Aunt   . Diabetes Maternal Aunt   . Bipolar disorder Son   . Drug abuse Son   . Heart attack Sister 35       during severe illness    Social History Social History   Tobacco Use  . Smoking status: Never Smoker  . Smokeless tobacco: Never Used  Substance Use Topics  . Alcohol use: No    Alcohol/week: 0.0 standard drinks    Comment: 12-25-2015 per pt no  . Drug use: No    Comment: 21-12-2015 per pt no      Allergies   Acetaminophen;  Benadryl [diphenhydramine hcl]; Codeine; Dilantin [phenytoin sodium extended]; Melatonin; Ultram [tramadol hcl]; Vimpat [lacosamide]; Betadine [povidone iodine]; Latex; Penicillins; Sulfa antibiotics; Tape; and Vicodin [hydrocodone-acetaminophen]   Review of Systems Review of Systems  Constitutional: Negative for fever.  HENT: Negative for sore throat.   Eyes: Negative for visual disturbance.  Respiratory: Negative for cough and shortness of breath.   Cardiovascular: Negative for chest pain.  Gastrointestinal: Negative for abdominal pain, diarrhea, nausea and vomiting.  Genitourinary: Negative for bladder incontinence and dysuria.  Skin: Negative for rash.  Neurological: Positive for seizures. Negative for speech difficulty and headaches.  Psychiatric/Behavioral: Negative for confusion.  Physical Exam Updated Vital Signs BP 122/78 (BP Location: Right Arm)   Pulse 92   Temp 98.2 F (36.8 C) (Oral)   Resp 12   Ht 5' (1.524 m)   Wt 71.7 kg   SpO2 99%   BMI 30.86 kg/m   Physical Exam  Constitutional: She is oriented to person, place, and time. She appears well-developed and well-nourished. No distress.  HENT:  Head: Normocephalic and atraumatic.  Eyes: Conjunctivae are normal.  Neck: Neck supple.  Cardiovascular: Normal rate and regular rhythm.  No murmur heard. Pulmonary/Chest: Effort normal and breath sounds normal. No respiratory distress.  Abdominal: Soft. There is no tenderness.  Musculoskeletal: She exhibits tenderness (left foot with abrasion). She exhibits no edema.  Neurological: She is alert and oriented to person, place, and time. She has normal strength. No cranial nerve deficit or sensory deficit. GCS eye subscore is 4. GCS verbal subscore is 5. GCS motor subscore is 6.  Skin: Skin is warm and dry.  Psychiatric: She has a normal mood and affect.  Nursing note and vitals reviewed.    ED Treatments / Results  Labs (all labs ordered are listed, but only  abnormal results are displayed) Labs Reviewed  BASIC METABOLIC PANEL - Abnormal; Notable for the following components:      Result Value   Calcium 8.8 (*)    All other components within normal limits  CBC WITH DIFFERENTIAL/PLATELET - Abnormal; Notable for the following components:   Hemoglobin 11.6 (*)    All other components within normal limits  URINALYSIS, ROUTINE W REFLEX MICROSCOPIC - Abnormal; Notable for the following components:   APPearance HAZY (*)    Hgb urine dipstick LARGE (*)    RBC / HPF >50 (*)    Bacteria, UA RARE (*)    All other components within normal limits    EKG EKG Interpretation  Date/Time:  Friday August 21 2017 12:01:59 EDT Ventricular Rate:  91 PR Interval:    QRS Duration: 103 QT Interval:  377 QTC Calculation: 464 R Axis:   56 Text Interpretation:  Sinus rhythm no acute st/ts similar to prior 7/19 Confirmed by Aletta Edouard [phone]) on 08/21/2017 12:16:08 PM   Radiology No results found.  Procedures Procedures (including critical care time)  Medications Ordered in ED Medications - No data to display   Initial Impression / Assessment and Plan / ED Course  I have reviewed the triage vital signs and the nursing notes.  Pertinent labs & imaging results that were available during my care of the patient were reviewed by me and considered in my medical decision making (see chart for details).  Clinical Course as of Aug 23 833  [facility] Aug 21, 2017  1226 Reviewed prior notes including last neurology note.  Neurologist feels these are non-epileptiform seizures but they are continuing her on her seizure medication.  Patient attributes the seizure increases to her grieving for deceased husband.  She does not appear postictal at all here although may be just slightly tired but clear sensorium.  She has some abrasions and tenderness of her foot but does not appear broken.  Getting some screening labs and EKG but I do not feel she needs any CNS imaging.    [MB]  1603 Received an email from 1 of the geriatric case managers who is hoping that the patient would allow them to do a home visit.  They also voiced a concern that there may be some element of serotonin syndrome and asked if  pharmacy would weigh in.  I spoke with the pharmacist that he felt it was very unlikely as she is on very low doses although he is on some medicines that may cause the symptoms.   [MB]    Clinical Course User Index [MB] Hayden Rasmussen, MD     Final Clinical Impressions(s) / ED Diagnoses   Final diagnoses:  Seizure-like activity ([place])  Hematuria, unspecified type    ED Discharge Orders    None       Hayden Rasmussen, MD 08/23/17 [phone]

## 2017-08-21 NOTE — Patient Outreach (Signed)
I was to make a final outreach to pt's daughter, [name], today to follow up on the information I have sent her and to see if they will allow me to participate on her care team. I note she is currently in the ED with having had siezure activity today. She has just seen her neurologist yesterday.  I have sent the ED MD, Dr. Melina Copa, an inbasket to request if he could influence the pt and family to allow me to participate with them for care management services.  I also asked Dr. Orlena Sheldon to consider a pharmacy consult to see if serotonin syndrome could the underlying issue for her multiple seizures.  I will follow up with this patient next week.  [name], MSN, [facility] Gerontological Nurse Practitioner [facility] Care Management [phone]

## 2017-08-22 ENCOUNTER — Encounter (HOSPITAL_COMMUNITY): Payer: Self-pay | Admitting: Emergency Medicine

## 2017-08-22 ENCOUNTER — Other Ambulatory Visit: Payer: Self-pay

## 2017-08-22 ENCOUNTER — Emergency Department (HOSPITAL_COMMUNITY)
Admission: EM | Admit: 2017-08-22 | Discharge: 2017-08-22 | Disposition: A | Discharge status: Home / Self Care | Payer: PPO | Attending: Emergency Medicine | Admitting: Emergency Medicine

## 2017-08-22 DIAGNOSIS — R569 Unspecified convulsions: Secondary | ICD-10-CM

## 2017-08-22 DIAGNOSIS — Z91040 Latex allergy status: Secondary | ICD-10-CM | POA: Diagnosis not present

## 2017-08-22 DIAGNOSIS — Z96642 Presence of left artificial hip joint: Secondary | ICD-10-CM | POA: Diagnosis not present

## 2017-08-22 DIAGNOSIS — Z79899 Other long term (current) drug therapy: Secondary | ICD-10-CM | POA: Insufficient documentation

## 2017-08-22 DIAGNOSIS — Z96651 Presence of right artificial knee joint: Secondary | ICD-10-CM | POA: Diagnosis not present

## 2017-08-22 DIAGNOSIS — G40909 Epilepsy, unspecified, not intractable, without status epilepticus: Secondary | ICD-10-CM | POA: Diagnosis not present

## 2017-08-22 LAB — CBC WITH DIFFERENTIAL/PLATELET
Basophils Absolute: 0 10*3/uL (ref 0.0–0.1)
Basophils Relative: 0 %
Eosinophils Absolute: 0 10*3/uL (ref 0.0–0.7)
Eosinophils Relative: 1 %
HCT: 35.5 % — ABNORMAL LOW (ref 36.0–46.0)
Hemoglobin: 11.8 g/dL — ABNORMAL LOW (ref 12.0–15.0)
Lymphocytes Relative: 32 %
Lymphs Abs: 2.4 10*3/uL (ref 0.7–4.0)
MCH: 29 pg (ref 26.0–34.0)
MCHC: 33.2 g/dL (ref 30.0–36.0)
MCV: 87.2 fL (ref 78.0–100.0)
Monocytes Absolute: 0.5 10*3/uL (ref 0.1–1.0)
Monocytes Relative: 6 %
Neutro Abs: 4.7 10*3/uL (ref 1.7–7.7)
Neutrophils Relative %: 61 %
Platelets: 273 10*3/uL (ref 150–400)
RBC: 4.07 MIL/uL (ref 3.87–5.11)
RDW: 15.2 % (ref 11.5–15.5)
WBC: 7.7 10*3/uL (ref 4.0–10.5)

## 2017-08-22 LAB — BASIC METABOLIC PANEL
Anion gap: 10 (ref 5–15)
BUN: 9 mg/dL (ref 6–20)
CO2: 21 mmol/L — ABNORMAL LOW (ref 22–32)
Calcium: 8.8 mg/dL — ABNORMAL LOW (ref 8.9–10.3)
Chloride: 108 mmol/L (ref 98–111)
Creatinine, Ser: 0.62 mg/dL (ref 0.44–1.00)
GFR calc Af Amer: >60 mL/min (ref >60)
GFR calc non Af Amer: >60 mL/min (ref >60)
Glucose, Bld: 118 mg/dL — ABNORMAL HIGH (ref 70–99)
Potassium: 3.8 mmol/L (ref 3.5–5.1)
Sodium: 139 mmol/L (ref 135–145)

## 2017-08-22 LAB — CBG MONITORING, ED: Glucose-Capillary: 112 mg/dL — ABNORMAL HIGH (ref 70–99)

## 2017-08-22 MED ORDER — LEVETIRACETAM IN NACL 1500 MG/100ML IV SOLN
1500.0000 mg | Freq: Once | INTRAVENOUS | Status: AC
  Administered 2017-08-22: 1500 mg via INTRAVENOUS
  Filled 2017-08-22: qty 100

## 2017-08-22 MED ORDER — LORAZEPAM 2 MG/ML IJ SOLN
1.0000 mg | Freq: Once | INTRAMUSCULAR | Status: AC
  Administered 2017-08-22: 1 mg via INTRAVENOUS
  Filled 2017-08-22: qty 1

## 2017-08-22 MED ORDER — LEVETIRACETAM 1000 MG PO TABS: 1000.0000 mg | ORAL_TABLET | Freq: Two times a day (BID) | ORAL | 0 refills | Status: DC

## 2017-08-22 NOTE — ED Notes (Signed)
ED Provider at bedside. 

## 2017-08-22 NOTE — ED Triage Notes (Signed)
Pt presents to the ED s/p multiple seizures over the past two days. Patient had Keppra dose lowered on Wednesday. Patient has been seen at [facility] and [place] for same. After d/c from [place], had 3 seizures in the back of the car so son brought her here. Patient had witnessed seizure in triage, EDPA Rob at bedside.

## 2017-08-22 NOTE — ED Provider Notes (Signed)
[place] DEPT Provider Note   CSN: [id number] Arrival date & time: 08/22/17  0039     History   Chief Complaint Chief Complaint  Patient presents with  . Seizures    HPI [name] is a 52 y.o. female.  Patient with past medical history markable for seizures, pseudoseizures, vertigo, presents to the emergency department with a chief complaint of seizures.  She is accompanied by her son, who states that she has had multiple seizures in the past few days.  They have been seen at [facility] as well as at [facility].  There is seen at [facility], and after discharge while driving home the patient had 3 additional seizures, so the son brought her to [facility] long emergency department for evaluation.  She had an additional seizure in triage which lasted approximately 15 to 20 seconds with generalized tonic clonic movements.  She was not postictal afterward.  She states that she has a headache, but otherwise denies any pain.  She reports that her Keppra level was lowered at some point during her recent visits.  She denies any recent illnesses.  The history is provided by the patient. No language interpreter was used.    Past Medical History:  Diagnosis Date  . Arthritis    "knees" (04/22/2012)  . Chronic bronchitis ([place])    "yearly; when the weather changes" (04/22/2012)  . Colon polyp   . Colon polyps    adenomatous and hyperplastic-  . Depression   . Eczema   . Epilepsy ([place])    "been having them right often here lately" (04/22/2012)  . Fatty liver   . High cholesterol   . History of kidney stones   . Osteoarthritis    [name] 04/22/2012  . Other convulsions 05/21/12   non-epileptic spells  . Restless leg   . Seizures ([place])   . Vertigo     Patient Active Problem List   Diagnosis Date Noted  . Widowed - July 2019 08/07/2017  . Other social stressor 06/09/2017  . Sleep apnea 06/09/2017  . DOE (dyspnea on exertion) 06/09/2017  .  Colon polyp 06/05/2017  . Elevated liver function tests 06/05/2017  . Fatty liver   . Mild neurocognitive disorder 02/04/2017  . 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 Bereavement 12/25/2015  . Partial symptomatic epilepsy with complex partial seizures, not intractable, with status epilepticus ([place]) 07/24/2015  . Insomnia w/ sleep apnea 07/24/2015  . OA (osteoarthritis) of knee 05/07/2015  . Memory loss 02/20/2014  . 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 Seizure disorder ([place]) 07/17/2011  . Major depressive disorder, single episode 10/14/2010    Past Surgical History:  Procedure Laterality Date  . ABDOMINAL HYSTERECTOMY  2001  . BLADDER SUSPENSION    . BUNIONECTOMY Left 2000  . CESAREAN SECTION  1987; 1988  . COLONOSCOPY    . JOINT REPLACEMENT    . MASS EXCISION  10/22/2011   Procedure: EXCISION MASS;  Surgeon: [name], MD;  Location: [place];  Service: General;  Laterality: Right;  excision right buttock mass  . TOTAL HIP ARTHROPLASTY Left 1993; 1995; 2000  . TOTAL KNEE ARTHROPLASTY Left 05/07/2015   Procedure: TOTAL LEFT KNEE ARTHROPLASTY;  Surgeon: [name], MD;  [facility];  Service: Orthopedics;  Laterality: Left;  . TOTAL KNEE ARTHROPLASTY Right 10/01/2015   Procedure: RIGHT TOTAL KNEE ARTHROPLASTY;  Surgeon: [name], MD;  [facility];  Service: Orthopedics;  Laterality: Right;     OB History  None      Home Medications    Prior to Admission medications   Medication Sig Start Date End Date Taking? Authorizing Provider  FLUoxetine HCl 60 MG TABS Take 60 mg by mouth daily. 07/23/17   Norman Clay, MD  gabapentin (NEURONTIN) 600 MG tablet Take 1 tablet (600 mg total) by mouth 3 (three) times daily. 02/03/17   [name], MD  lamoTRIgine (LAMICTAL) 150 MG tablet Take 1 tablet (150 mg total) by mouth 2 (two) times daily. 07/20/17 08/21/17  [name], NP  levETIRAcetam (KEPPRA) 1000 MG tablet Take 1 tablet (1,000 mg total) by mouth 2 (two) times  daily. Take 1 tablet PO in the morning and 1.5 tablets at bedtime 08/19/17   Dohmeier, Asencion Partridge, MD  LORazepam (ATIVAN) 1 MG tablet 1-1.5 mg daily as needed for anxiety 07/23/17   Norman Clay, MD  mirtazapine (REMERON) 15 MG tablet Take 0.5 tablets (7.5 mg total) by mouth at bedtime. 07/23/17   Norman Clay, MD  pramipexole (MIRAPEX) 0.125 MG tablet Take 2 tablets by mouth at bedtime. 10/01/12   [provider]    Family History Family History  Problem Relation Age of Onset  . Cancer Mother   . Depression Father   . Cancer Brother   . Diabetes Brother   . Cancer Maternal Aunt   . Diabetes Maternal Aunt   . Bipolar disorder Son   . Drug abuse Son   . Heart attack Sister 80       during severe illness    Social History Social History   Tobacco Use  . Smoking status: Never Smoker  . Smokeless tobacco: Never Used  Substance Use Topics  . Alcohol use: No    Alcohol/week: 0.0 standard drinks    Comment: 12-25-2015 per pt no  . Drug use: No    Comment: 21-12-2015 per pt no      Allergies   Acetaminophen; Benadryl [diphenhydramine hcl]; Codeine; Dilantin [phenytoin sodium extended]; Melatonin; Ultram [tramadol hcl]; Vimpat [lacosamide]; Tramadol; Betadine [povidone iodine]; Latex; Penicillins; Sulfa antibiotics; Tape; and Vicodin [hydrocodone-acetaminophen]   Review of Systems Review of Systems  All other systems reviewed and are negative.    Physical Exam Updated Vital Signs BP (!) 125/99 (BP Location: Left Arm)   Pulse 90   Temp 98.3 F (36.8 C) (Oral)   Resp 20   SpO2 97%   Physical Exam  Constitutional: She is oriented to person, place, and time. She appears well-developed and well-nourished.  HENT:  Head: Normocephalic and atraumatic.  Eyes: Pupils are equal, round, and reactive to light. Conjunctivae and EOM are normal.  Neck: Normal range of motion. Neck supple.  Cardiovascular: Normal rate and regular rhythm. Exam reveals no gallop and no friction  rub.  No murmur heard. Pulmonary/Chest: Effort normal and breath sounds normal. No respiratory distress. She has no wheezes. She has no rales. She exhibits no tenderness.  Abdominal: Soft. Bowel sounds are normal. She exhibits no distension and no mass. There is no tenderness. There is no rebound and no guarding.  Musculoskeletal: Normal range of motion. She exhibits no edema or tenderness.  Neurological: She is alert and oriented to person, place, and time.  Skin: Skin is warm and dry.  Psychiatric: She has a normal mood and affect. Her behavior is normal. Judgment and thought content normal.  Nursing note and vitals reviewed.    ED Treatments / Results  Labs (all labs ordered are listed, but only abnormal results are displayed) Labs Reviewed  CBC WITH DIFFERENTIAL/PLATELET - Abnormal; Notable for the following components:      Result Value   Hemoglobin 11.8 (*)    HCT 35.5 (*)    All other components within normal limits  BASIC METABOLIC PANEL - Abnormal; Notable for the following components:   CO2 21 (*)    Glucose, Bld 118 (*)    Calcium 8.8 (*)    All other components within normal limits  CBG MONITORING, ED - Abnormal; Notable for the following components:   Glucose-Capillary 112 (*)    All other components within normal limits    EKG None  Radiology No results found.  Procedures Procedures (including critical care time)  Medications Ordered in ED Medications  LORazepam (ATIVAN) injection 1 mg (has no administration in time range)     Initial Impression / Assessment and Plan / ED Course  I have reviewed the triage vital signs and the nursing notes.  Pertinent labs & imaging results that were available during my care of the patient were reviewed by me and considered in my medical decision making (see chart for details).     Patient with multiple seizures.  She recently had her Keppra reduced.  In reviewing prior work-ups, patient has been seen by neurology,  there is some concern for epilepsy.  Patient seen by discussed with Dr. Leonette Monarch, who recommends giving the patient a loading dose of Keppra and increasing her Keppra back to 1000 mg twice daily.  Patient has remained seizure-free in the emergency department following her 1 seizure in triage.  Feel that she is stable for outpatient follow-up.  Final Clinical Impressions(s) / ED Diagnoses   Final diagnoses:  Seizure [facility])    ED Discharge Orders         Ordered    levETIRAcetam (KEPPRA) 1000 MG tablet  2 times daily     08/22/17 0558           Montine Circle, PA-C 08/22/17 0559    Leonette Monarch Grayce Sessions, MD 08/22/17 [phone]

## 2017-08-26 ENCOUNTER — Other Ambulatory Visit: Payer: Self-pay | Admitting: *Deleted

## 2017-08-26 NOTE — Patient Outreach (Signed)
Called to see if I could engage pt for an in home visit. Today, I spoke with her son, Elliot Simoneaux, who states that I could come see her next Monday at 10:00 am.  I am going to refer also to pharmacy regarding her polypharmacy and siezures to see if they think there may be a link for serotonin syndrome.  [name], MSN, [facility] Gerontological Nurse Practitioner [facility] Care Management [phone]

## 2017-08-27 ENCOUNTER — Other Ambulatory Visit: Payer: Self-pay

## 2017-08-27 NOTE — Patient Outreach (Signed)
[name]) Care Management  08/27/2017  [name] 19-May-1965 [id number]  52 year old female referred to [facility].  [facility] services requested for medication review in light of frequent seizures.  PMHx includes, but not limited to, seizure disorder, depression and insomnia.  Unsuccessful outreach attempt to [name].  Her daughter states that she in on a trip with her sister and states that I should call be next week.   Asked byTHN NP to evalutate whether recurrent seizures could be attributed to serotonin syndrome.       Most symptoms of serotonin syndrome (SS) typically develop within 2 hours of dose increase or addition of serotonergic drug.  [name] has been on Fluoxetine 60 mg daily for several years.  Mirtazapine was added 02/04/17 by Dr. Livia [name] and Quetiapine was added 05/12/17 by [facility] physician.    75% experience symptoms within 24 hours.  Some symptoms include hypertonia, spontaneous or inducible clonus, diaphoresis, temperature > 100.4 degres F, diarrhea, mydriasis, increased bowel sounds.    Fluoxetine-Serotonin syndrome listed as adverse event in <1% of post marketing observations.   Quetiapine Use with caution in patients with seizures per manufacturer. Seizure listed as adverse event in <1% of post marketing observations.    Pramipexole- No issues   Mirtazepine -Seizure listed as adverse event in <1% of post marketing observations. Serotonin syndrome listed as a possible adverse effect.  I do not believe that [name] has serotonin syndrome causing her seizures, as she displays none of the other characteristic symptoms. Given the severity of SS if left untreated, it would progress and be fatal.  However, while very rare, the addition of mirtazapine and/or quetiapine could perhaps cause an increase in frequency of her seizures.  Again, the incidence would be very low.  If either of these medications are discontinued,  they should be tapered off slowly.  Plan: Outreach to [name] early next week.   Route note to [facility] NP, Deloria Lair.  Joetta Manners, PharmD Clinical Pharmacist [place] [phone]

## 2017-08-31 ENCOUNTER — Encounter: Payer: Self-pay | Admitting: *Deleted

## 2017-08-31 ENCOUNTER — Other Ambulatory Visit: Payer: Self-pay | Admitting: *Deleted

## 2017-08-31 NOTE — Patient Outreach (Signed)
[facility]) Care Management   08/31/2017  [name] 02/12/1965 [id number]  [name] is an 52 y.o. female  Subjective: New patient, initial home visit. Pt has had many ED visits and hospitalizations for siezures over the last 9 months. Her last admission her seizure medication was increased and she has not had a seizure since then. She has also not taken her seroquel since then either.  She lives alone but has family living in the houses next to her. Her daughter-in-law, [name], stays with her during the day. Of note, her husband passed away on Aug 03, 2017.  She has been known to wander during these events out of her house, into the woods, down the highway. This is very disconcerting to everyone. She is getting a dog this week which is a step in the right direction for her protection.  Objective:   Review of Systems  Constitutional: Negative.   HENT: Negative.   Eyes: Negative.   Respiratory: Negative.   Cardiovascular: Negative.   Gastrointestinal: Negative.   Genitourinary: Negative.   Musculoskeletal: Negative for falls.  Skin: Negative.   Neurological: Positive for seizures and weakness.       No seizures since Keppra was increased and seroquel was stopped.  Endo/Heme/Allergies: Negative.   Psychiatric/Behavioral: Positive for depression and memory loss. The patient is nervous/anxious and has insomnia.    BP 118/70   Pulse 67   Resp 18   Ht 1.524 m (5')   Wt 158 lb (71.7 kg)   SpO2 98%   BMI 30.86 kg/m   Physical Exam  Constitutional: She is oriented to person, place, and time. She appears well-developed and well-nourished.  HENT:  Head: Normocephalic.  Cardiovascular: Normal rate, regular rhythm and normal heart sounds.  Respiratory: Effort normal and breath sounds normal.  GI: Soft. Bowel sounds are normal.  Musculoskeletal: Normal range of motion.  Neurological: She is alert and oriented to person, place, and time.  Skin: Skin is  warm and dry.  Psychiatric: She has a normal mood and affect.    Encounter Medications:   Patient was recently discharged from hospital and all medications have been reviewed. Allergies as of 08/31/2017      Reactions   Acetaminophen Other (See Comments)   Has fatty deposits on liver   Benadryl [diphenhydramine Hcl] Other (See Comments)   hyperactivity and seizures   Codeine Itching, Rash   seizures   Dilantin [phenytoin Sodium Extended] Other (See Comments)   Elevated LFT's   Melatonin Other (See Comments)   seizures   Tramadol Other (See Comments)   "causes seizures"   Ultram [tramadol Hcl] Other (See Comments)   Seizures   Vimpat [lacosamide] Other (See Comments)   Severe dizziness   Betadine [povidone Iodine] Rash   Latex Rash   Penicillins Itching, Rash   Has patient had a PCN reaction causing immediate rash, facial/tongue/throat swelling, SOB or lightheadedness with hypotension: No Has patient had a PCN reaction causing severe rash involving mucus membranes or skin necrosis: No Has patient had a PCN reaction that required hospitalization No Has patient had a PCN reaction occurring within the last 10 years: No If all of the above answers are "NO", then may proceed with Cephalosporin use.   Sulfa Antibiotics Rash   Tape Rash   Paper tape please   Vicodin [hydrocodone-acetaminophen] Itching      Medication List        Accurate as of 08/31/17 11:59 PM. Always use  your most recent med list.          FLUoxetine HCl 60 MG Tabs Take 60 mg by mouth daily.   gabapentin 600 MG tablet Commonly known as:  NEURONTIN Take 1 tablet (600 mg total) by mouth 3 (three) times daily.   lamoTRIgine 150 MG tablet Commonly known as:  LAMICTAL Take 1 tablet (150 mg total) by mouth 2 (two) times daily.   levETIRAcetam 1000 MG tablet Commonly known as:  KEPPRA Take 1 tablet (1,000 mg total) by mouth 2 (two) times daily. Take 1 tablet PO in the morning and 1.5 tablets at bedtime     LORazepam 1 MG tablet Commonly known as:  ATIVAN 1-1.5 mg daily as needed for anxiety   meclizine 25 MG tablet Commonly known as:  ANTIVERT Take 25 mg by mouth 3 (three) times daily as needed for dizziness.   mirtazapine 15 MG tablet Commonly known as:  REMERON Take 0.5 tablets (7.5 mg total) by mouth at bedtime.   pramipexole 0.125 MG tablet Commonly known as:  MIRAPEX Take 2 tablets by mouth at bedtime.   QUEtiapine 50 MG tablet Commonly known as:  SEROQUEL Take 50 mg by mouth at bedtime. Told to hold by NP until she sees psychiatrist on 09/03/17.      Functional Status:   In your present state of health, do you have any difficulty performing the following activities: 08/31/2017 03/03/2017  Hearing? N N  Vision? N N  Difficulty concentrating or making decisions? Y N  Walking or climbing stairs? N N  Dressing or bathing? N N  Doing errands, shopping? Y N  Preparing Food and eating ? N -  Using the Toilet? N -  In the past six months, have you accidently leaked urine? N -  Do you have problems with loss of bowel control? N -  Managing your Medications? Y -  Managing your Finances? N -  Housekeeping or managing your Housekeeping? N -  Some recent data might be hidden    Fall/Depression Screening:    Fall Risk  08/31/2017 06/05/2017 04/17/2017  Falls in the past year? Yes Yes No  Number falls in past yr: 2 or more 2 or more -  Injury with Fall? Yes Yes -  Risk Factor Category  High Fall Risk High Fall Risk -  Risk for fall due to : History of fall(s);Medication side effect - -  Risk for fall due to: Comment - - -  Follow up Falls evaluation completed;Falls prevention discussed;Education provided;Follow up appointment - -   PHQ 2/9 Scores 08/31/2017 04/17/2017 04/13/2017 02/03/2017 01/30/2017 01/09/2017 12/01/2016  PHQ - 2 Score 2 1 2 2  0 1 0  PHQ- 9 Score 14 - 17 13 - - -    Assessment:  Improving seizure disorder                         Depression (chronic and grief  reaction)  Plan: Discussed the possibility of an emergency alert, GPS. I will investigate options.           Pt or [name] or pt's mother, [name] to call me if pt has a seizure or goes to the hospital. They are to call me with any problems. [name] is to keep track of seizures,                       when, how long, what preceded.  Pt to continue holding seroquel until she sees her psychiatrist next week.  THN CM Care Plan Problem One     Most Recent Value  Care Plan Problem One  Frequent seizures requiring hospitalization.  Role Documenting the Problem One  Care Management [place] for Problem One  Active  THN Long Term Goal   Pt will have improved safety of her enviroment  THN Long Term Goal Start Date  08/31/17  Interventions for Problem One Long Term Goal  Discussed family practice of when pt has a seizure.  They practice safety and protect her head from injury, although she may have fallen. Someone is with her from the time she gets up until she goes to bed.  THN CM Short Term Goal #1   Pt will take her medications daily on schedule per report of pt and family.  THN CM Short Term Goal #1 Start Date  08/31/17  Interventions for Short Term Goal #1  Reviewed medications and routine: Mother prepares med box and brings her meds over a day at a time.  THN CM Short Term Goal #2   Pt will obtain an emergency alert/GPS system.  THN CM Short Term Goal #2 Start Date  08/31/17  Interventions for Short Term Goal #2  Discussed this and the family has been considering it. She is getting a dog this week.                 [name], MSN, [facility] Gerontological Nurse Practitioner [facility] Care Management [phone]

## 2017-09-01 ENCOUNTER — Encounter: Payer: Self-pay | Admitting: Family Medicine

## 2017-09-01 ENCOUNTER — Ambulatory Visit: Payer: Self-pay

## 2017-09-01 ENCOUNTER — Other Ambulatory Visit: Payer: Self-pay

## 2017-09-01 NOTE — Progress Notes (Signed)
[facility] MD/PA/NP OP Progress Note  09/03/2017 10:03 AM CHANITA BODEN  MRN:  [id number]  Chief Complaint:  Chief Complaint    Follow-up; Depression     HPI:  - She visited ED a couple of times for syncopal like episode.  Patient presents for follow-up appointment for depression.  She states that she has been feeling better.  She has not had any seizure for the past 2 weeks since her Keppra was uptitrated. She hopes to get service dog. She was advised by her PCP to inquire regarding quetiapine; she has been on this until two weeks ago. (Per chart review, this note writer has not prescribed this medication). She denies any significant difference since being off this medication. She still misses her husband. However, it has become slightly less, and she enjoys being with her family. She had a good birthday. She denies insomnia.  She feels less depressed.  She has fair concentration.  She denies SI.  She feels less anxious and tense.  She denies panic attacks.  She takes Ativan 1 mg twice a day for anxiety.   Per PMP,  Lorazepam filled in 07/23/2017    Visit Diagnosis:    ICD-10-CM   1. 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 I have reviewed the history. No updates at this time.     Past Medical History:  Past Medical History:  Diagnosis Date  . Arthritis    "knees" (04/22/2012)  . Chronic bronchitis ([place])    "yearly; when the weather changes" (04/22/2012)  . Colon polyp   . Colon polyps    adenomatous and hyperplastic-  . Depression   . Eczema   . Epilepsy ([place])    "been having them right often here lately" (04/22/2012)  . Fatty liver   . High cholesterol   . History of kidney stones   . Osteoarthritis    [name] 04/22/2012  . Other convulsions 05/21/12   non-epileptic spells  . Restless leg   . Seizures ([place])   . Vertigo     Past Surgical History:   Procedure Laterality Date  . ABDOMINAL HYSTERECTOMY  2001  . BLADDER SUSPENSION    . BUNIONECTOMY Left 2000  . CESAREAN SECTION  1987; 1988  . COLONOSCOPY    . JOINT REPLACEMENT    . MASS EXCISION  10/22/2011   Procedure: EXCISION MASS;  Surgeon: [name], MD;  Location: [facility];  Service: General;  Laterality: Right;  excision right buttock mass  . TOTAL HIP ARTHROPLASTY Left 1993; 1995; 2000  . TOTAL KNEE ARTHROPLASTY Left 05/07/2015   Procedure: TOTAL LEFT KNEE ARTHROPLASTY;  Surgeon: [name], MD;  Location: [facility];  Service: Orthopedics;  Laterality: Left;  . TOTAL KNEE ARTHROPLASTY Right 10/01/2015   Procedure: RIGHT TOTAL KNEE ARTHROPLASTY;  Surgeon: [name], MD;  Location: [facility];  Service: Orthopedics;  Laterality: Right;    Family Psychiatric History: Please see initial evaluation for full details. I have reviewed the history. No updates at this time.     Family History:  Family History  Problem Relation Age of Onset  . Cancer Mother   . Depression Father   . Cancer Brother   . Diabetes Brother   . Cancer Maternal Aunt   . Diabetes Maternal Aunt   . Bipolar disorder Son   . Drug abuse Son   . Heart attack Sister 39  during severe illness    Social History:  Social History   Socioeconomic History  . Marital status: Widowed    Spouse name: [name]  . Number of children: 2  . Years of education: 12 +  . Highest education level: Not on file  Occupational History  . Occupation: disability, medical  Social Needs  . Financial resource strain: Not very hard  . Food insecurity:    Worry: Never true    Inability: Never true  . Transportation needs:    Medical: No    Non-medical: No  Tobacco Use  . Smoking status: Never Smoker  . Smokeless tobacco: Never Used  Substance and Sexual Activity  . Alcohol use: No    Alcohol/week: 0.0 standard drinks    Comment: 12-25-2015 per pt no  . Drug use: No    Comment: 21-12-2015 per pt no   .  Sexual activity: Not Currently    Birth control/protection: Surgical  Lifestyle  . Physical activity:    Days per week: 7 days    Minutes per session: 60 min  . Stress: Rather much  Relationships  . Social connections:    Talks on phone: More than three times a week    Gets together: More than three times a week    Attends religious service: More than 4 times per year    Active member of club or organization: No    Attends meetings of clubs or organizations: Not on file    Relationship status: Widowed  Other Topics Concern  . Not on file  Social History Narrative   Patient has two adult children.   Patient is disabled.   Patient has a high school education and some trade school.   Patient is right-handed.   Patient drinks four glasses of soda and tea daily.    Allergies:  Allergies  Allergen Reactions  . Acetaminophen Other (See Comments)    Has fatty deposits on liver  . Benadryl [Diphenhydramine Hcl] Other (See Comments)    hyperactivity and seizures  . Codeine Itching and Rash    seizures  . Dilantin [Phenytoin Sodium Extended] Other (See Comments)    Elevated LFT's  . Melatonin Other (See Comments)    seizures  . Tramadol Other (See Comments)    "causes seizures"  . Ultram [Tramadol Hcl] Other (See Comments)    Seizures  . Vimpat [Lacosamide] Other (See Comments)    Severe dizziness  . Betadine [Povidone Iodine] Rash  . Latex Rash  . Penicillins Itching and Rash    Has patient had a PCN reaction causing immediate rash, facial/tongue/throat swelling, SOB or lightheadedness with hypotension: No Has patient had a PCN reaction causing severe rash involving mucus membranes or skin necrosis: No Has patient had a PCN reaction that required hospitalization No Has patient had a PCN reaction occurring within the last 10 years: No If all of the above answers are "NO", then may proceed with Cephalosporin use.  . Sulfa Antibiotics Rash  . Tape Rash    Paper tape please   . Vicodin [Hydrocodone-Acetaminophen] Itching    Metabolic Disorder Labs: Lab Results  Component Value Date   HGBA1C 5.4 11/28/2015   MPG 108 11/28/2015   No results found for: PROLACTIN Lab Results  Component Value Date   CHOL 164 07/07/2016   TRIG 124 07/07/2016   HDL 53 07/07/2016   CHOLHDL 3.1 07/07/2016   LDLCALC 86 07/07/2016   LDLCALC 136 (H) 04/09/2015   Lab Results  Component  Value Date   TSH 1.900 07/07/2016   TSH 2.559 12/03/2015    Therapeutic Level Labs: No results found for: LITHIUM No results found for: VALPROATE No components found for:  CBMZ  Current Medications: Current Outpatient Medications  Medication Sig Dispense Refill  . FLUoxetine HCl 60 MG TABS Take 60 mg by mouth daily. 90 tablet 0  . gabapentin (NEURONTIN) 600 MG tablet Take 1 tablet (600 mg total) by mouth 3 (three) times daily. 90 tablet 5  . levETIRAcetam (KEPPRA) 1000 MG tablet Take 1 tablet (1,000 mg total) by mouth 2 (two) times daily. Take 1 tablet PO in the morning and 1.5 tablets at bedtime 60 tablet 0  . LORazepam (ATIVAN) 1 MG tablet Take 1 tablet (1 mg total) by mouth 2 (two) times daily as needed for anxiety. 60 tablet 2  . meclizine (ANTIVERT) 25 MG tablet Take 25 mg by mouth 3 (three) times daily as needed for dizziness.    . mirtazapine (REMERON) 15 MG tablet Take 0.5 tablets (7.5 mg total) by mouth at bedtime. 45 tablet 0  . pramipexole (MIRAPEX) 0.125 MG tablet Take 2 tablets by mouth at bedtime.    Marland Kitchen QUEtiapine (SEROQUEL) 50 MG tablet Take 50 mg by mouth at bedtime. Told to hold by NP until she sees psychiatrist on 09/03/17.    Marland Kitchen lamoTRIgine (LAMICTAL) 150 MG tablet Take 1 tablet (150 mg total) by mouth 2 (two) times daily. 60 tablet 5   No current facility-administered medications for this visit.      Musculoskeletal: Strength & Muscle Tone: within normal limits Gait & Station: normal Patient leans: N/A  Psychiatric Specialty Exam: Review of Systems   Psychiatric/Behavioral: Positive for depression. Negative for hallucinations, memory loss, substance abuse and suicidal ideas. The patient is nervous/anxious. The patient does not have insomnia.   All other systems reviewed and are negative.   Blood pressure 118/80, pulse 62, height 5' (1.524 m), weight 164 lb (74.4 kg), SpO2 100 %.Body mass index is 32.03 kg/m.  General Appearance: Fairly Groomed  Eye Contact:  Good  Speech:  Clear and Coherent  Volume:  Normal  Mood:  "better"  Affect:  Appropriate, Congruent and slightly down, but reactive  Thought Process:  Coherent  Orientation:  Full (Time, Place, and Person)  Thought Content: Logical   Suicidal Thoughts:  No  Homicidal Thoughts:  No  Memory:  Immediate;   Good  Judgement:  Good  Insight:  Fair  Psychomotor Activity:  Normal  Concentration:  Concentration: Good and Attention Span: Good  Recall:  Good  Fund of Knowledge: Good  Language: Good  Akathisia:  No  Handed:  Right  AIMS (if indicated): not done  Assets:  Communication Skills Desire for Improvement  ADL's:  Intact  Cognition: WNL  Sleep:  Good   Screenings: GAD-7     Office Visit from 01/31/2015 in [facility]  Total GAD-7 Score  15    Mini-Mental     Office Visit from 02/20/2014 in [facility]  Total Score (max 30 points )  30    PHQ2-9     Patient Outreach from 08/31/2017 in [facility] Office Visit from 04/17/2017 in [facility] Visit from 04/13/2017 in [facility] Visit from 02/03/2017 in [facility] Visit from 01/30/2017 in [facility]  PHQ-2 Total Score  2  1  2  2   0  PHQ-9 Total Score  14  -  17  13  -       Assessment and Plan:  DYANN GOODSPEED is a 52 y.o. year old female with a history of depression, pseudoseizure, partial symptomatic epilepsy with complex partial seizures,  osteoarthritis, congenital hip dislocation s/p replacement, who presents for follow up appointment for Current moderate episode of major depressive disorder without prior episode ([place])  # MDD, moderate, recurrent without psychotic features There has been significant improvement in neurovegetative symptoms since the last appointment.  This coincided it was improvement in seizure-like episode after reuptitration of Keppra by neurologist.  Will continue current medication regimen; will continue fluoxetine to target depression and anxiety.  Will continue mirtazapine as adjunctive treatment for depression.  Will continue Ativan as needed for anxiety.  Discussed risk of dependence and oversedation.  Noted that she had been on quetiapine for several months, although this note writer has not prescribed this medication. Given she reports no significant difference despite being off this medication for two weeks, she is advised to hold this medication. Validated her grief. Discussed behavioral activation.   # Mild neurocognitive disorder Patient does have mild cognitive impairment, as characterized by [place]. Will consider Aricept if any worsening in her symptoms.   Plan I have reviewed and updated plans as below 1. Continue fluoxetine 60 mg daily  2.Reinitiatemirtazapine7.5 mg at night 3. Continue ativan 1 mg twice a day as needed for anxiety 4. Return to clinic in three months for 15 mins Patient is on gabapentin, Lamictal, Keppra for seizure. She is also on pramipexole for restless leg - She asks that she hopes to have service dog. Will provide a letter to support this.   The patient demonstrates the following risk factors for suicide: Chronic risk factors for suicide include: psychiatric disorder of depressionand previous suicide attempts of overdosing medication. Acute risk factorsfor suicide include: unemployment and her husband with terminal illness. Protective factorsfor this patient include:  positive social support, coping skills and hope for the future. Considering these factors, the overall suicide risk at this point appears to be low. Patient isappropriate for outpatient follow up.  The duration of this appointment visit was 30 minutes of face-to-face time with the patient.  Greater than 50% of this time was spent in counseling, explanation of  diagnosis, planning of further management, and coordination of care.  Norman Clay, MD 09/03/2017, 10:03 AM

## 2017-09-01 NOTE — Patient Outreach (Signed)
[place] [facility]) Care Management  [place]   09/01/2017  MINAH AXELROD 1965/03/16 [id number]   52 year old female referred to [facility].  [place] services requested for medication management to review her seizure medications.   PMHx includes, but not limited to, seizure disorder, fatty liver, depression, sleep apnea and vertigo.    Successful outreach attempt to Ms. Vereen.  HIPAA identifiers verified.   Subjective: Ms. Rodak reports that she has not had a seizure in one week.   She states that she feels her increased seizure frequency was due to a recent Keppra dose reduction and that now that she is back on the higher dose, she is doing much better.   Ms. Sanjurjo spoke a lot about the recent death of her husband and the stress she has been under.  She reports that she has a lot of family support living near her.  She states that she is unable to tell that she is getting ready to have a seizure, but that her grandkids can tell because they say she stares, gets quiet and won't respond to them.   Ms. Smithers reports that she got a shelter dog this week and she is excited to try to get him trained as a service dog.  She states that it will help keep her from being lonely.  She reports that she is out of her quetiapine and was told to hold this medication by [facility] NP, until she sees her psychiatrist on Thursday.   Current Medications: Current Outpatient Medications  Medication Sig Dispense Refill  . FLUoxetine HCl 60 MG TABS Take 60 mg by mouth daily. 90 tablet 0  . gabapentin (NEURONTIN) 600 MG tablet Take 1 tablet (600 mg total) by mouth 3 (three) times daily. 90 tablet 5  . levETIRAcetam (KEPPRA) 1000 MG tablet Take 1 tablet (1,000 mg total) by mouth 2 (two) times daily. Take 1 tablet PO in the morning and 1.5 tablets at bedtime 60 tablet 0  . LORazepam (ATIVAN) 1 MG tablet 1-1.5 mg daily as needed for anxiety (Patient taking differently: Take 1-1.5 mg by  mouth daily as needed for anxiety or seizure. 1-1.5 mg daily as needed for anxiety) 45 tablet 0  . mirtazapine (REMERON) 15 MG tablet Take 0.5 tablets (7.5 mg total) by mouth at bedtime. 45 tablet 0  . pramipexole (MIRAPEX) 0.125 MG tablet Take 2 tablets by mouth at bedtime.    . lamoTRIgine (LAMICTAL) 150 MG tablet Take 1 tablet (150 mg total) by mouth 2 (two) times daily. 60 tablet 5  . meclizine (ANTIVERT) 25 MG tablet Take 25 mg by mouth 3 (three) times daily as needed for dizziness.    Marland Kitchen QUEtiapine (SEROQUEL) 50 MG tablet Take 50 mg by mouth at bedtime. Told to hold by NP until she sees psychiatrist on 09/03/17.     No current facility-administered medications for this visit.     Functional Status: In your present state of health, do you have any difficulty performing the following activities: 08/31/2017 03/03/2017  Hearing? N N  Vision? N N  Difficulty concentrating or making decisions? Y N  Walking or climbing stairs? N N  Dressing or bathing? N N  Doing errands, shopping? Y N  Preparing Food and eating ? N -  Using the Toilet? N -  In the past six months, have you accidently leaked urine? N -  Do you have problems with loss of bowel control? N -  Managing  your Medications? Y -  Managing your Finances? N -  Housekeeping or managing your Housekeeping? N -  Some recent data might be hidden    Fall/Depression Screening: Fall Risk  08/31/2017 06/05/2017 04/17/2017  Falls in the past year? Yes Yes No  Number falls in past yr: 2 or more 2 or more -  Injury with Fall? Yes Yes -  Risk Factor Category  High Fall Risk High Fall Risk -  Risk for fall due to : History of fall(s);Medication side effect - -  Risk for fall due to: Comment - - -  Follow up Falls evaluation completed;Falls prevention discussed;Education provided;Follow up appointment - -   PHQ 2/9 Scores 08/31/2017 04/17/2017 04/13/2017 02/03/2017 01/30/2017 01/09/2017 12/01/2016  PHQ - 2 Score 2 1 2 2  0 1 0  PHQ- 9 Score 14 - 17 13  - - -   ASSESSMENT: Per our conversation, Ms. Norby is clearly grieving after the recent loss of her husband.  She did sound much more upbeat when the conversation changed to her new dog.  She states that she enjoys gardening and I encouraged her to stay active as hobbies and exercise (walking her new dog)  will reduce stress.   Of Ms. Imparato's medications, Quetiapine and mirtazapine have seizures listed as an adverse event in <1% in post marketing observation.  Quetiapine is a newer to her regimen than mirtazapine.  While very rare, the addition of mirtazapine and/or quetiapine could perhaps cause an increase in frequency of her seizures.     Plan: Outreach to patient after her psychiatry visit on Thursday to see if  any medication changes made.         Joetta Manners, PharmD Clinical Pharmacist [place] [phone]

## 2017-09-03 ENCOUNTER — Encounter (HOSPITAL_COMMUNITY): Payer: Self-pay | Admitting: Psychiatry

## 2017-09-03 ENCOUNTER — Ambulatory Visit (HOSPITAL_COMMUNITY): Payer: PPO | Admitting: Psychiatry

## 2017-09-03 VITALS — BP 118/80 | HR 62 | Ht 60.0 in | Wt 164.0 lb

## 2017-09-03 DIAGNOSIS — F321 Major depressive disorder, single episode, moderate: Secondary | ICD-10-CM

## 2017-09-03 MED ORDER — FLUOXETINE HCL 60 MG PO TABS: 60.0000 mg | ORAL_TABLET | Freq: Every day | ORAL | 0 refills | Status: DC

## 2017-09-03 MED ORDER — MIRTAZAPINE 15 MG PO TABS: 7.5000 mg | ORAL_TABLET | Freq: Every day | ORAL | 0 refills | Status: DC

## 2017-09-03 MED ORDER — LORAZEPAM 1 MG PO TABS: 1.0000 mg | ORAL_TABLET | Freq: Two times a day (BID) | ORAL | 2 refills | Status: DC | PRN

## 2017-09-03 NOTE — Patient Instructions (Addendum)
1. Continue fluoxetine 60 mg daily  2.Reinitiatemirtazapine7.5 mg at night 3. Continue ativan 1 mg twice a day as needed for anxiety 4. Return to clinic in three months for 15 mins

## 2017-09-04 ENCOUNTER — Other Ambulatory Visit: Payer: Self-pay

## 2017-09-04 ENCOUNTER — Ambulatory Visit: Payer: Self-pay

## 2017-09-04 NOTE — Patient Outreach (Signed)
[place] [facility]) Care Management  09/04/2017  [name] 1965/07/27 [id number]  Unsuccessful outreach attempt to [name].  Line was busy for extended period and could not leave a message.    Plan: Outreach attempt on Monday 8/26.  Joetta Manners, PharmD Clinical Pharmacist [place] [phone]

## 2017-09-07 ENCOUNTER — Ambulatory Visit: Payer: Self-pay

## 2017-09-07 ENCOUNTER — Other Ambulatory Visit: Payer: Self-pay

## 2017-09-07 NOTE — Patient Outreach (Signed)
[place] [facility]) Care Management  09/07/2017  [name] [birth year] [id number]  52 year old female referred to [facility].  [place] services requested for medication management to review her seizure medications.   PMHx includes, but not limited to, seizure disorder, fatty liver, depression, sleep apnea and vertigo.    Successful outreach attempt to [name].  HIPAA identifiers verified.    Subjective: [name] reports that she is doing well and states that she has not had anymore seizures.  She states that her psychiatrist did not resume her Seroquel and that her next visit is in 3 months.  Patient again talks about her new dog and sounds happy as she describes a recent incident with the dog.     Patient states that she has no further medication questions or concerns.   She states the [facility] NP is coming out for home visit on 9/11.  Informed her that I will close her [place] case at this time.  She is aware that she can call me in the future should any medication issues arise.  Plan: Route note and discipline closure letter to PCP, Dr. Jonni [name].  Inform [facility] NP, [name] of pharmacy case closure.  [name], PharmD Clinical Pharmacist [place] [phone]

## 2017-09-08 ENCOUNTER — Other Ambulatory Visit: Payer: Self-pay | Admitting: Family Medicine

## 2017-09-09 ENCOUNTER — Other Ambulatory Visit: Payer: Self-pay | Admitting: *Deleted

## 2017-09-11 ENCOUNTER — Other Ambulatory Visit: Payer: Self-pay | Admitting: Family Medicine

## 2017-09-15 ENCOUNTER — Other Ambulatory Visit: Payer: Self-pay | Admitting: Family Medicine

## 2017-09-15 MED ORDER — GABAPENTIN 600 MG PO TABS: 600.0000 mg | ORAL_TABLET | Freq: Three times a day (TID) | ORAL | 3 refills | Status: DC

## 2017-09-15 NOTE — Telephone Encounter (Signed)
Copied from [place] [id number] [phone]. Topic: Quick Communication - Rx Refill/Question >> Sep 15, 2017 11:03 AM Percell Belt A wrote: Medication: gabapentin (NEURONTIN) 600 MG tablet [[id number] Has the patient contacted their pharmacy? No  (Agent: If no, request that the patient contact the pharmacy for the refill.) (Agent: If yes, when and what did the pharmacy advise?)  Preferred Pharmacy (with phone number or street name): [place], [place] [address] [phone] (Phone)   Agent: Please be advised that RX refills may take up to 3 business days. We ask that you follow-up with your pharmacy.

## 2017-09-15 NOTE — Telephone Encounter (Signed)
Neurontin refill Last Refill:02/03/17 # 90 with 5 refills Last OV: 06/05/17 PCP: Dr. Jonni Sanger Pharmacy:[facility] [place]

## 2017-09-21 ENCOUNTER — Other Ambulatory Visit: Payer: Self-pay

## 2017-09-21 ENCOUNTER — Ambulatory Visit (INDEPENDENT_AMBULATORY_CARE_PROVIDER_SITE_OTHER): Payer: PPO | Admitting: Family Medicine

## 2017-09-21 ENCOUNTER — Encounter: Payer: Self-pay | Admitting: Family Medicine

## 2017-09-21 VITALS — BP 118/80 | HR 68 | Temp 98.2°F | Ht 60.0 in | Wt 167.4 lb

## 2017-09-21 DIAGNOSIS — R7989 Other specified abnormal findings of blood chemistry: Secondary | ICD-10-CM

## 2017-09-21 DIAGNOSIS — F339 Major depressive disorder, recurrent, unspecified: Secondary | ICD-10-CM | POA: Diagnosis not present

## 2017-09-21 DIAGNOSIS — Z0000 Encounter for general adult medical examination without abnormal findings: Secondary | ICD-10-CM

## 2017-09-21 DIAGNOSIS — K760 Fatty (change of) liver, not elsewhere classified: Secondary | ICD-10-CM

## 2017-09-21 DIAGNOSIS — Z23 Encounter for immunization: Secondary | ICD-10-CM

## 2017-09-21 DIAGNOSIS — R945 Abnormal results of liver function studies: Secondary | ICD-10-CM | POA: Diagnosis not present

## 2017-09-21 DIAGNOSIS — G3184 Mild cognitive impairment, so stated: Secondary | ICD-10-CM

## 2017-09-21 DIAGNOSIS — D126 Benign neoplasm of colon, unspecified: Secondary | ICD-10-CM

## 2017-09-21 LAB — CBC WITH DIFFERENTIAL/PLATELET
Basophils Absolute: 0 10*3/uL (ref 0.0–0.1)
Basophils Relative: 0.4 % (ref 0.0–3.0)
Eosinophils Absolute: 0.1 10*3/uL (ref 0.0–0.7)
Eosinophils Relative: 1.8 % (ref 0.0–5.0)
HCT: 33.4 % — ABNORMAL LOW (ref 36.0–46.0)
Hemoglobin: 11.3 g/dL — ABNORMAL LOW (ref 12.0–15.0)
Lymphocytes Relative: 37.3 % (ref 12.0–46.0)
Lymphs Abs: 1.7 10*3/uL (ref 0.7–4.0)
MCHC: 33.8 g/dL (ref 30.0–36.0)
MCV: 87.5 fl (ref 78.0–100.0)
Monocytes Absolute: 0.5 10*3/uL (ref 0.1–1.0)
Monocytes Relative: 11.5 % (ref 3.0–12.0)
Neutro Abs: 2.3 10*3/uL (ref 1.4–7.7)
Neutrophils Relative %: 49 % (ref 43.0–77.0)
Platelets: 239 10*3/uL (ref 150.0–400.0)
RBC: 3.82 Mil/uL — ABNORMAL LOW (ref 3.87–5.11)
RDW: 15.2 % (ref 11.5–15.5)
WBC: 4.6 10*3/uL (ref 4.0–10.5)

## 2017-09-21 LAB — TSH: TSH: 1.52 u[IU]/mL (ref 0.35–4.50)

## 2017-09-21 LAB — LIPID PANEL
Cholesterol: 151 mg/dL (ref 0–200)
HDL: 53.1 mg/dL (ref >39.00)
LDL Cholesterol: 73 mg/dL (ref 0–99)
NonHDL: 97.58
Total CHOL/HDL Ratio: 3
Triglycerides: 124 mg/dL (ref 0.0–149.0)
VLDL: 24.8 mg/dL (ref 0.0–40.0)

## 2017-09-21 LAB — COMPREHENSIVE METABOLIC PANEL
ALT: 41 U/L — ABNORMAL HIGH (ref 0–35)
AST: 27 U/L (ref 0–37)
Albumin: 4 g/dL (ref 3.5–5.2)
Alkaline Phosphatase: 65 U/L (ref 39–117)
BUN: 11 mg/dL (ref 6–23)
CO2: 30 mEq/L (ref 19–32)
Calcium: 9 mg/dL (ref 8.4–10.5)
Chloride: 108 mEq/L (ref 96–112)
Creatinine, Ser: 0.66 mg/dL (ref 0.40–1.20)
GFR: 99.92 mL/min (ref >60.00)
Glucose, Bld: 64 mg/dL — ABNORMAL LOW (ref 70–99)
Potassium: 3.9 mEq/L (ref 3.5–5.1)
Sodium: 144 mEq/L (ref 135–145)
Total Bilirubin: 0.4 mg/dL (ref 0.2–1.2)
Total Protein: 6.1 g/dL (ref 6.0–8.3)

## 2017-09-21 MED ORDER — ZOSTER VAC RECOMB ADJUVANTED 50 MCG/0.5ML IM SUSR: 0.5000 mL | Freq: Once | INTRAMUSCULAR | 0 refills | Status: AC

## 2017-09-21 NOTE — Patient Instructions (Signed)
Please return in 6 months for recheck. Please schedule your colonoscopy.  Medicare recommends an Annual Wellness Visit for all patients. Please schedule this to be done with our Nurse Educator, Maudie Mercury. This is an informative "talk" visit; it's goals are to ensure that your health care needs are being met and to give you education regarding avoiding falls, ensuring you are not suffering from depression or problems with memory or thinking, and to educate you on Advance Care Planning. It helps me take good care of you!  If you have any questions or concerns, please don't hesitate to send me a message via MyChart or call the office at [phone]. Thank you for visiting with [name] today! It's our pleasure caring for you.  Please do these things to maintain good health!   Exercise at least 30-45 minutes a day,  4-5 days a week.   Eat a low-fat diet with lots of fruits and vegetables, up to 7-9 servings per day.  Drink plenty of water daily. Try to drink 8 8oz glasses per day.  Seatbelts can save your life. Always wear your seatbelt.  Place Smoke Detectors on every level of your home and check batteries every year.  Schedule an appointment with an eye doctor for an eye exam every 1-2 years  Safe sex - use condoms to protect yourself from STDs if you could be exposed to these types of infections. Use birth control if you do not want to become pregnant and are sexually active.  Avoid heavy alcohol use. If you drink, keep it to less than 2 drinks/day and not every day.  [place].  Choose someone you trust that could speak for you if you became unable to speak for yourself.  Depression is common in our stressful world.If you're feeling down or losing interest in things you normally enjoy, please come in for a visit.  If anyone is threatening or hurting you, please get help. Physical or Emotional Violence is never OK.

## 2017-09-21 NOTE — Progress Notes (Signed)
Subjective  Chief Complaint  Patient presents with  . Annual Exam    Requests Flu   . Fatigue    patient states that she feels sore all over     HPI: [name] is a 52 y.o. female who presents to [place] at [facility] today for a Female Wellness Visit. She also has the concerns and/or needs as listed above in the chief complaint. These will be addressed in addition to the Health Maintenance Visit.   Wellness Visit: annual visit with health maintenance review and exam without Pap   Patient here for physical.  Recently widowed.  Husband had been terminally ill for many years.  She is coping well.  In fact, now that stress is improved, she is recovering.  No further seizures.  Reviewed recent psychiatric notes which confirm mood is stabilizing.  Patient got a dog from the rescue shelter and is doing well with dogs companionship.  Walking the dog multiple times per day.  Feeling sore from walks.  Mammogram up-to-date: Colonoscopy due.  Flu shot due today. Chronic disease f/u and/or acute problem visit: (deemed necessary to be done in addition to the wellness visit):  Seizure disorder: Stable since Keppra has been increased.  Chronic major depression: Stabilizing.  Mild cognitive disorder, likely in part due to stressors.  Will monitor over time.  Elevated liver test on antiepileptics: Due for recheck.  No abdominal pain or jaundice.  History of adenomatous polyps due for repeat colonoscopy.  Patient deferred due to husband's death but is now ready to schedule  Assessment  1. Annual physical exam   2. Adenomatous polyp of colon, unspecified part of colon   3. Fatty liver   4. Major depression, recurrent, chronic (HCC)   5. Elevated LFTs   6. Mild neurocognitive disorder      Plan  Female Wellness Visit:  Age appropriate Health Maintenance and Prevention measures were discussed with patient. Included topics are cancer screening recommendations, ways to  keep healthy (see AVS) including dietary and exercise recommendations, regular eye and dental care, use of seat belts, and avoidance of moderate alcohol use and tobacco use.  Patient will make appointment for colonoscopy.  Annual wellness visit recommended  BMI: discussed patient's BMI and encouraged positive lifestyle modifications to help get to or maintain a target BMI.  HM needs and immunizations were addressed and ordered. See below for orders. See HM and immunization section for updates.  Flu shot today.  Prescription given for zoster, Shingrix  Routine labs and screening tests ordered including cmp, cbc and lipids where appropriate.  Discussed recommendations regarding Vit D and calcium supplementation (see AVS)  Chronic disease management visit and/or acute problem visit:  Seizure disorder currently stable per neuro  Elevated LFTs with history of fatty liver: Screening for hepatitis done today.  Monitor liver functions.  Major depression stabilizing.  Follow-up with psychiatry.  No change in medications today.  History of mild neurocognitive disorder: We will follow mental status exams.  Could improve given recent stressors are now improved. Follow up: Return in about 6 months (around 03/22/2018) for recheck.  Orders Placed This Encounter  Procedures  . Comprehensive metabolic panel  . CBC with Differential/Platelet  . Lipid panel  . TSH  . Hepatitis C antibody  . Hepatitis B Surface AntiGEN  . Hepatitis B surface antibody,qualitative   Meds ordered this encounter  Medications  . Zoster Vaccine Adjuvanted [facility]) injection    Sig: Inject 0.5 mLs into the  muscle once for 1 dose. Please give 2nd dose 2-6 months after first dose    Dispense:  2 each    Refill:  0      Lifestyle: Body mass index is 32.69 kg/m. Wt Readings from Last 3 Encounters:  09/21/17 167 lb 6.4 oz (75.9 kg)  09/03/17 164 lb (74.4 kg)  08/31/17 158 lb (71.7 kg)    Patient Active Problem List     Diagnosis Date Noted  . Widowed - July 2019 08/07/2017  . Sleep apnea 06/09/2017    Documented a year ago- pt felt it was from her seizure medication and did not follow through with C-pap   . Colon polyp 06/05/2017  . Elevated liver function tests 06/05/2017  . Fatty liver   . Mild neurocognitive disorder 02/04/2017  . 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 Bereavement 12/25/2015  . Partial symptomatic epilepsy with complex partial seizures, not intractable, with status epilepticus ([place]) 07/24/2015  . Insomnia w/ sleep apnea 07/24/2015  . OA (osteoarthritis) of knee 05/07/2015  . 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 Major depressive disorder, single episode 10/14/2010   Health Maintenance  Topic Date Due  . MAMMOGRAM  08/15/2015  . COLONOSCOPY  06/23/2017  . INFLUENZA VACCINE  08/13/2017  . PAP SMEAR  07/08/2019  . HIV Screening  Completed   Immunization History  Administered Date(s) Administered  . Influenza, Quadrivalent, Recombinant, Inj, Pf 12/18/2016  . Influenza,inj,Quad PF,6+ Mos 10/25/2012, 11/27/2014, 10/30/2015, 11/10/2016  . Influenza-Unspecified 10/22/2013   We updated and reviewed the patient's past history in detail and it is documented below. Allergies: Patient is allergic to acetaminophen; benadryl [diphenhydramine hcl]; codeine; dilantin [phenytoin sodium extended]; melatonin; tramadol; ultram [tramadol hcl]; vimpat [lacosamide]; betadine [povidone iodine]; latex; penicillins; sulfa antibiotics; tape; and vicodin [hydrocodone-acetaminophen]. Past Medical History Patient  has a past medical history of Arthritis, Chronic bronchitis ([place]), Colon polyp, Colon polyps, Depression, Eczema, Epilepsy ([place]), Fatty liver, High cholesterol, History of kidney stones, Osteoarthritis, Other convulsions (05/21/12), Restless leg, Seizures ([place]), and Vertigo. Past Surgical History Patient  has a past surgical history that includes Total hip arthroplasty (Left, 1993; 1995; 2000); Abdominal  hysterectomy (2001); Colonoscopy; Joint replacement; Mass excision (10/22/2011); Bladder suspension; Cesarean section (4034; 1988); Bunionectomy (Left, 2000); Total knee arthroplasty (Left, 05/07/2015); and Total knee arthroplasty (Right, 10/01/2015). Family History: Patient family history includes Bipolar disorder in her son; Cancer in her brother, maternal aunt, and mother; Depression in her father; Diabetes in her brother and maternal aunt; Drug abuse in her son; Heart attack (age of onset: 90) in her sister. Social History:  Patient  reports that she has never smoked. She has never used smokeless tobacco. She reports that she does not drink alcohol or use drugs.  Review of Systems: Constitutional: negative for fever or malaise Ophthalmic: negative for photophobia, double vision or loss of vision Cardiovascular: negative for chest pain, dyspnea on exertion, or new LE swelling Respiratory: negative for SOB or persistent cough Gastrointestinal: negative for abdominal pain, change in bowel habits or melena Genitourinary: negative for dysuria or gross hematuria, no abnormal uterine bleeding or disharge Musculoskeletal: negative for new gait disturbance or muscular weakness Integumentary: negative for new or persistent rashes, no breast lumps Neurological: negative for TIA or stroke symptoms Psychiatric: negative for SI or delusions Allergic/Immunologic: negative for hives  Patient Care Team    Relationship Specialty Notifications Start End  [name], MD PCP - General Family Medicine  06/05/17   [name], MD Referring Physician Family Medicine  05/12/17  Irene Shipper, MD Consulting Physician Gastroenterology  06/05/17   Dohmeier, Asencion Partridge, MD Consulting Physician Neurology  06/05/17     Objective  Vitals: BP 118/80   Pulse 68   Temp 98.2 F (36.8 C)   Ht 5' (1.524 m)   Wt 167 lb 6.4 oz (75.9 kg)   SpO2 98%   BMI 32.69 kg/m  General:  Well developed, well nourished, no acute  distress  Psych:  Alert and orientedx3,normal mood and affect HEENT:  Normocephalic, atraumatic, non-icteric sclera, PERRL, oropharynx is clear without mass or exudate, supple neck without adenopathy, mass or thyromegaly Cardiovascular:  Normal S1, S2, RRR without gallop, rub or murmur, nondisplaced PMI Respiratory:  Good breath sounds bilaterally, CTAB with normal respiratory effort Gastrointestinal: normal bowel sounds, soft, non-tender, no noted masses. No HSM MSK: no deformities, contusions. Joints are without erythema or swelling. Spine and CVA region are nontender Skin:  Warm, no rashes or suspicious lesions noted Neurologic:    Mental status is normal. CN 2-11 are normal. Gross motor and sensory exams are normal. Normal gait. No tremor Breast Exam: No mass, skin retraction or nipple discharge is appreciated in either breast. No axillary adenopathy. Fibrocystic changes are not noted   Commons side effects, risks, benefits, and alternatives for medications and treatment plan prescribed today were discussed, and the patient expressed understanding of the given instructions. Patient is instructed to call or message via MyChart if he/she has any questions or concerns regarding our treatment plan. No barriers to understanding were identified. We discussed Red Flag symptoms and signs in detail. Patient expressed understanding regarding what to do in case of urgent or emergency type symptoms.   Medication list was reconciled, printed and provided to the patient in AVS. Patient instructions and summary information was reviewed with the patient as documented in the AVS. This note was prepared with assistance of Dragon voice recognition software. Occasional wrong-word or sound-a-like substitutions may have occurred due to the inherent limitations of voice recognition software

## 2017-09-22 LAB — HEPATITIS C ANTIBODY
Hepatitis C Ab: NONREACTIVE
SIGNAL TO CUT-OFF: 0.03 (ref <1.00)

## 2017-09-22 LAB — HEPATITIS B SURFACE ANTIBODY,QUALITATIVE: Hep B S Ab: NONREACTIVE

## 2017-09-22 LAB — HEPATITIS B SURFACE ANTIGEN: Hepatitis B Surface Ag: NONREACTIVE

## 2017-09-23 ENCOUNTER — Other Ambulatory Visit: Payer: Self-pay | Admitting: *Deleted

## 2017-09-23 NOTE — Patient Outreach (Signed)
[facility]) Care Management   09/23/2017  [name] 1965/01/22 [id number]  [name] is an 52 y.o. female  Subjective: Pt and daughter report NO SIEZURE ACTIVITY since my last visit on 08/31/17.  She has had a couple episodes of moderate anxiety which was relieved with lorazepam 1.0 mg. The family did acquire a dog and she is very vigilent over [name]. They hope to have her specifically trained for sensing the siezure aura which she already seems to have a sense for when [name] is more anxious than usual. "[name]" stays right beside her and follows her around.   Pt is considering an emergency alert.  Objective:   Review of Systems  Constitutional: Negative.   HENT: Negative.   Eyes: Negative.   Respiratory: Negative.   Cardiovascular: Negative.   Gastrointestinal: Negative.   Genitourinary: Negative.   Musculoskeletal: Negative.   Skin: Negative.   Neurological: Negative.   Endo/Heme/Allergies: Negative.   Psychiatric/Behavioral: Positive for depression. The patient is nervous/anxious.    BP 110/70   Pulse 70   Resp 16   Wt 165 lb (74.8 kg)   SpO2 98%   BMI 32.22 kg/m   Physical Exam  Constitutional: She appears well-developed and well-nourished.  HENT:  Head: Normocephalic and atraumatic.  Neck: Normal range of motion. JVD present.  Cardiovascular: Normal rate and regular rhythm.  Respiratory: Effort normal and breath sounds normal.  GI: Soft. Bowel sounds are normal.  Musculoskeletal: Normal range of motion.  Neurological: She is alert.  Skin: Skin is warm and dry.  Psychiatric: She has a normal mood and affect.  Pt does suffer from grief and anxiety but takes medication for both.    Encounter Medications:   Outpatient Encounter Medications as of 09/23/2017  Medication Sig Note  . FLUoxetine HCl 60 MG TABS Take 60 mg by mouth daily.   Marland Kitchen gabapentin (NEURONTIN) 600 MG tablet Take 1 tablet (600 mg total) by mouth 3 (three)  times daily.   Marland Kitchen lamoTRIgine (LAMICTAL) 150 MG tablet Take 1 tablet (150 mg total) by mouth 2 (two) times daily. 08/31/2017: Pt IS TAKING this med and has 4 refill on current Rx.  . levETIRAcetam (KEPPRA) 1000 MG tablet Take 1 tablet (1,000 mg total) by mouth 2 (two) times daily. Take 1 tablet PO in the morning and 1.5 tablets at bedtime   . LORazepam (ATIVAN) 1 MG tablet Take 1 tablet (1 mg total) by mouth 2 (two) times daily as needed for anxiety.   . meclizine (ANTIVERT) 25 MG tablet Take 25 mg by mouth 3 (three) times daily as needed for dizziness.   . mirtazapine (REMERON) 15 MG tablet Take 0.5 tablets (7.5 mg total) by mouth at bedtime.   . pramipexole (MIRAPEX) 0.125 MG tablet Take 2 tablets by mouth at bedtime.    No facility-administered encounter medications on file as of 09/23/2017.     Functional Status:   In your present state of health, do you have any difficulty performing the following activities: 08/31/2017 03/03/2017  Hearing? N N  Vision? N N  Difficulty concentrating or making decisions? Y N  Walking or climbing stairs? N N  Dressing or bathing? N N  Doing errands, shopping? Y N  Preparing Food and eating ? N -  Using the Toilet? N -  In the past six months, have you accidently leaked urine? N -  Do you have problems with loss of bowel control? N -  Managing your  Medications? Y -  Managing your Finances? N -  Housekeeping or managing your Housekeeping? N -  Some recent data might be hidden    Fall/Depression Screening:    Fall Risk  08/31/2017 06/05/2017 04/17/2017  Falls in the past year? Yes Yes No  Number falls in past yr: 2 or more 2 or more -  Injury with Fall? Yes Yes -  Risk Factor Category  High Fall Risk High Fall Risk -  Risk for fall due to : History of fall(s);Medication side effect - -  Risk for fall due to: Comment - - -  Follow up Falls evaluation completed;Falls prevention discussed;Education provided;Follow up appointment - -   PHQ 2/9 Scores  08/31/2017 04/17/2017 04/13/2017 02/03/2017 01/30/2017 01/09/2017 12/01/2016  PHQ - 2 Score _0 0 1 0  PHQ- 9 Score 14 - 17 13 - - -    Assessment:  Siezure disorder under control with present regimen.                          Depression/Grief reaction and Anxiety  THN CM Care Plan Problem One     Most Recent Value  Care Plan Problem One  Frequent seizures requiring hospitalization.  Role Documenting the Problem One  Care Management [place] for Problem One  Active  THN Long Term Goal   Pt will have improved safety of her enviroment  THN Long Term Goal Start Date  08/31/17  Interventions for Problem One Long Term Goal  Gave brochure for [place].  THN CM Short Term Goal #1   Pt will take her medications daily on schedule per report of pt and family.  THN CM Short Term Goal #1 Start Date  08/31/17  [facility] CM Short Term Goal #1 Met Date  09/23/17  Interventions for Short Term Goal #1  Praised for medication compliance.  THN CM Short Term Goal #2   Pt will obtain an emergency alert/GPS system.  THN CM Short Term Goal #2 Start Date  08/31/17  Interventions for Short Term Goal #2  Gave brochure for Med-ALert which has several different devices and price range.      Plan:  Gave Med-Alert brochure for pt consideration.            Discussed grief  Support group for consideration.            If the coming month goes well and pt is stable I will see her one last time.  [name]. Myrtie Neither, MSN, [facility] Gerontological Nurse Practitioner [facility] Care Management [phone]

## 2017-10-07 DIAGNOSIS — H40003 Preglaucoma, unspecified, bilateral: Secondary | ICD-10-CM | POA: Diagnosis not present

## 2017-10-07 DIAGNOSIS — H16142 Punctate keratitis, left eye: Secondary | ICD-10-CM | POA: Diagnosis not present

## 2017-10-07 DIAGNOSIS — H532 Diplopia: Secondary | ICD-10-CM | POA: Diagnosis not present

## 2017-10-21 ENCOUNTER — Ambulatory Visit: Payer: Self-pay | Admitting: *Deleted

## 2017-10-22 DIAGNOSIS — G40909 Epilepsy, unspecified, not intractable, without status epilepticus: Secondary | ICD-10-CM | POA: Diagnosis not present

## 2017-10-22 DIAGNOSIS — E7800 Pure hypercholesterolemia, unspecified: Secondary | ICD-10-CM | POA: Diagnosis not present

## 2017-10-22 DIAGNOSIS — Z91040 Latex allergy status: Secondary | ICD-10-CM | POA: Diagnosis not present

## 2017-10-22 DIAGNOSIS — R404 Transient alteration of awareness: Secondary | ICD-10-CM | POA: Diagnosis not present

## 2017-10-22 DIAGNOSIS — Z886 Allergy status to analgesic agent status: Secondary | ICD-10-CM | POA: Diagnosis not present

## 2017-10-22 DIAGNOSIS — R569 Unspecified convulsions: Secondary | ICD-10-CM | POA: Diagnosis not present

## 2017-10-22 DIAGNOSIS — I10 Essential (primary) hypertension: Secondary | ICD-10-CM | POA: Diagnosis not present

## 2017-10-22 DIAGNOSIS — Z880 Allergy status to penicillin: Secondary | ICD-10-CM | POA: Diagnosis not present

## 2017-10-22 DIAGNOSIS — R410 Disorientation, unspecified: Secondary | ICD-10-CM | POA: Diagnosis not present

## 2017-10-22 DIAGNOSIS — Z888 Allergy status to other drugs, medicaments and biological substances status: Secondary | ICD-10-CM | POA: Diagnosis not present

## 2017-10-22 DIAGNOSIS — Z79899 Other long term (current) drug therapy: Secondary | ICD-10-CM | POA: Diagnosis not present

## 2017-10-26 DIAGNOSIS — R569 Unspecified convulsions: Secondary | ICD-10-CM | POA: Diagnosis not present

## 2017-10-26 DIAGNOSIS — S0990XA Unspecified injury of head, initial encounter: Secondary | ICD-10-CM | POA: Diagnosis not present

## 2017-10-26 DIAGNOSIS — F329 Major depressive disorder, single episode, unspecified: Secondary | ICD-10-CM | POA: Diagnosis not present

## 2017-10-26 DIAGNOSIS — Z79899 Other long term (current) drug therapy: Secondary | ICD-10-CM | POA: Diagnosis not present

## 2017-10-26 DIAGNOSIS — F439 Reaction to severe stress, unspecified: Secondary | ICD-10-CM | POA: Diagnosis not present

## 2017-10-26 DIAGNOSIS — E785 Hyperlipidemia, unspecified: Secondary | ICD-10-CM | POA: Diagnosis not present

## 2017-10-26 DIAGNOSIS — Z8669 Personal history of other diseases of the nervous system and sense organs: Secondary | ICD-10-CM | POA: Diagnosis not present

## 2017-10-26 DIAGNOSIS — R0902 Hypoxemia: Secondary | ICD-10-CM | POA: Diagnosis not present

## 2017-10-26 DIAGNOSIS — W19XXXA Unspecified fall, initial encounter: Secondary | ICD-10-CM | POA: Diagnosis not present

## 2017-10-29 ENCOUNTER — Emergency Department (HOSPITAL_COMMUNITY)
Admission: EM | Admit: 2017-10-29 | Discharge: 2017-10-30 | Disposition: A | Discharge status: Other Acute Inpatient Hospital | Payer: PPO | Attending: Emergency Medicine | Admitting: Emergency Medicine

## 2017-10-29 ENCOUNTER — Encounter (HOSPITAL_COMMUNITY): Payer: Self-pay

## 2017-10-29 ENCOUNTER — Other Ambulatory Visit: Payer: Self-pay

## 2017-10-29 DIAGNOSIS — Z96652 Presence of left artificial knee joint: Secondary | ICD-10-CM | POA: Diagnosis not present

## 2017-10-29 DIAGNOSIS — R0689 Other abnormalities of breathing: Secondary | ICD-10-CM | POA: Diagnosis not present

## 2017-10-29 DIAGNOSIS — R457 State of emotional shock and stress, unspecified: Secondary | ICD-10-CM | POA: Diagnosis not present

## 2017-10-29 DIAGNOSIS — R45851 Suicidal ideations: Secondary | ICD-10-CM | POA: Insufficient documentation

## 2017-10-29 DIAGNOSIS — R7989 Other specified abnormal findings of blood chemistry: Secondary | ICD-10-CM

## 2017-10-29 DIAGNOSIS — R945 Abnormal results of liver function studies: Secondary | ICD-10-CM | POA: Diagnosis not present

## 2017-10-29 DIAGNOSIS — F329 Major depressive disorder, single episode, unspecified: Secondary | ICD-10-CM | POA: Diagnosis present

## 2017-10-29 DIAGNOSIS — F332 Major depressive disorder, recurrent severe without psychotic features: Secondary | ICD-10-CM | POA: Diagnosis not present

## 2017-10-29 LAB — RAPID URINE DRUG SCREEN, HOSP PERFORMED
Amphetamines: NOT DETECTED
Barbiturates: NOT DETECTED
Benzodiazepines: NOT DETECTED
Cocaine: NOT DETECTED
Opiates: NOT DETECTED
Tetrahydrocannabinol: NOT DETECTED

## 2017-10-29 LAB — COMPREHENSIVE METABOLIC PANEL
ALT: 126 U/L — ABNORMAL HIGH (ref 0–44)
AST: 121 U/L — ABNORMAL HIGH (ref 15–41)
Albumin: 4.3 g/dL (ref 3.5–5.0)
Alkaline Phosphatase: 67 U/L (ref 38–126)
Anion gap: 8 (ref 5–15)
BUN: 13 mg/dL (ref 6–20)
CO2: 24 mmol/L (ref 22–32)
Calcium: 9.2 mg/dL (ref 8.9–10.3)
Chloride: 106 mmol/L (ref 98–111)
Creatinine, Ser: 0.69 mg/dL (ref 0.44–1.00)
GFR calc Af Amer: >60 mL/min (ref >60)
GFR calc non Af Amer: >60 mL/min (ref >60)
Glucose, Bld: 107 mg/dL — ABNORMAL HIGH (ref 70–99)
Potassium: 3.3 mmol/L — ABNORMAL LOW (ref 3.5–5.1)
Sodium: 138 mmol/L (ref 135–145)
Total Bilirubin: 0.6 mg/dL (ref 0.3–1.2)
Total Protein: 7.5 g/dL (ref 6.5–8.1)

## 2017-10-29 LAB — CBC WITH DIFFERENTIAL/PLATELET
Abs Immature Granulocytes: 0.02 10*3/uL (ref 0.00–0.07)
Basophils Absolute: 0 10*3/uL (ref 0.0–0.1)
Basophils Relative: 1 %
Eosinophils Absolute: 0.1 10*3/uL (ref 0.0–0.5)
Eosinophils Relative: 1 %
HCT: 37.7 % (ref 36.0–46.0)
Hemoglobin: 11.9 g/dL — ABNORMAL LOW (ref 12.0–15.0)
Immature Granulocytes: 0 %
Lymphocytes Relative: 28 %
Lymphs Abs: 2.4 10*3/uL (ref 0.7–4.0)
MCH: 28.1 pg (ref 26.0–34.0)
MCHC: 31.6 g/dL (ref 30.0–36.0)
MCV: 89.1 fL (ref 80.0–100.0)
Monocytes Absolute: 0.7 10*3/uL (ref 0.1–1.0)
Monocytes Relative: 8 %
Neutro Abs: 5.2 10*3/uL (ref 1.7–7.7)
Neutrophils Relative %: 62 %
Platelets: 332 10*3/uL (ref 150–400)
RBC: 4.23 MIL/uL (ref 3.87–5.11)
RDW: 13.9 % (ref 11.5–15.5)
WBC: 8.4 10*3/uL (ref 4.0–10.5)
nRBC: 0 % (ref 0.0–0.2)

## 2017-10-29 LAB — ACETAMINOPHEN LEVEL
Acetaminophen (Tylenol), Serum: <10 ug/mL — ABNORMAL LOW (ref 10–30)
Acetaminophen (Tylenol), Serum: <10 ug/mL — ABNORMAL LOW (ref 10–30)

## 2017-10-29 LAB — ETHANOL: Alcohol, Ethyl (B): <10 mg/dL (ref <10)

## 2017-10-29 LAB — SALICYLATE LEVEL: Salicylate Lvl: <7 mg/dL (ref 2.8–30.0)

## 2017-10-29 LAB — LIPASE, BLOOD: Lipase: 42 U/L (ref 11–51)

## 2017-10-29 MED ORDER — MECLIZINE HCL 12.5 MG PO TABS
25.0000 mg | ORAL_TABLET | Freq: Three times a day (TID) | ORAL | Status: DC | PRN
  Administered 2017-10-29: 25 mg via ORAL
  Filled 2017-10-29: qty 2

## 2017-10-29 MED ORDER — MIRTAZAPINE 15 MG PO TABS
7.5000 mg | ORAL_TABLET | Freq: Every day | ORAL | Status: DC
  Administered 2017-10-29: 7.5 mg via ORAL
  Filled 2017-10-29: qty 1

## 2017-10-29 MED ORDER — PRAMIPEXOLE DIHYDROCHLORIDE 0.25 MG PO TABS
0.2500 mg | ORAL_TABLET | Freq: Every day | ORAL | Status: DC
  Administered 2017-10-29: 0.25 mg via ORAL
  Filled 2017-10-29 (×4): qty 1

## 2017-10-29 MED ORDER — LORAZEPAM 1 MG PO TABS
1.0000 mg | ORAL_TABLET | Freq: Two times a day (BID) | ORAL | Status: DC | PRN
  Administered 2017-10-29: 1 mg via ORAL
  Filled 2017-10-29: qty 1

## 2017-10-29 MED ORDER — LAMOTRIGINE 25 MG PO TABS
150.0000 mg | ORAL_TABLET | Freq: Two times a day (BID) | ORAL | Status: DC
  Administered 2017-10-29 – 2017-10-30 (×2): 150 mg via ORAL
  Filled 2017-10-29 (×2): qty 6

## 2017-10-29 MED ORDER — GABAPENTIN 300 MG PO CAPS
600.0000 mg | ORAL_CAPSULE | Freq: Three times a day (TID) | ORAL | Status: DC
  Administered 2017-10-29 – 2017-10-30 (×3): 600 mg via ORAL
  Filled 2017-10-29 (×3): qty 2

## 2017-10-29 MED ORDER — FLUOXETINE HCL 20 MG PO CAPS
60.0000 mg | ORAL_CAPSULE | Freq: Every day | ORAL | Status: DC
  Administered 2017-10-29 – 2017-10-30 (×2): 60 mg via ORAL
  Filled 2017-10-29 (×2): qty 3

## 2017-10-29 MED ORDER — LEVETIRACETAM 500 MG PO TABS
1000.0000 mg | ORAL_TABLET | Freq: Two times a day (BID) | ORAL | Status: DC
  Administered 2017-10-29 – 2017-10-30 (×2): 1000 mg via ORAL
  Filled 2017-10-29 (×2): qty 2

## 2017-10-29 NOTE — ED Provider Notes (Signed)
Emergency Department Provider Note   I have reviewed the triage vital signs and the nursing notes.   HISTORY  Chief Complaint Medical Clearance   HPI [name] is a 52 y.o. female with PMH of depression, seizure disorder and HLD presents to the emergency department for evaluation of suicidal ideation.  Patient states that her husband died in 25-Aug-2022 and she is been very distraught since that time.  She states today she was looking through old pictures of him and tells me "it just came over me and I just wanted to be with him."  She states that she had thoughts of hurting herself today and was feeling unsafe.  She had not formulated a plan on how to do this but states she was watching a TV show where they were performing autopsies on suicide victims thinking more about this.  She called the sheriff's department who evaluated and laced her under IVC.  She states she has not taken her depression medications for the past several days.  She has been taking her seizure medications.  Her last seizure was 1 week prior.  She denies any drinking or illicit drugs.  She states that she did take 3 "stacker" tablets which are sold over-the-counter.  She states there is post to increase her energy.  She states she is feeling so depressed that she does not even care to get up and clean or take care of herself and thought these would help.  She states that taking these pills was not a suicide attempt.   Past Medical History:  Diagnosis Date  . Arthritis    "knees" (04/22/2012)  . Chronic bronchitis ([place])    "yearly; when the weather changes" (04/22/2012)  . Colon polyp   . Colon polyps    adenomatous and hyperplastic-  . Depression   . Eczema   . Epilepsy ([place])    "been having them right often here lately" (04/22/2012)  . Fatty liver   . High cholesterol   . History of kidney stones   . Osteoarthritis    [name] 04/22/2012  . Other convulsions 05/21/12   non-epileptic spells  . Restless leg   .  Seizures ([place])   . Vertigo     Patient Active Problem List   Diagnosis Date Noted  . Widowed - July 2019 08/07/2017  . Sleep apnea 06/09/2017  . Colon polyp 06/05/2017  . Elevated liver function tests 06/05/2017  . Fatty liver   . Mild neurocognitive disorder 02/04/2017  . 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 Bereavement 12/25/2015  . Partial symptomatic epilepsy with complex partial seizures, not intractable, with status epilepticus ([place]) 07/24/2015  . Insomnia w/ sleep apnea 07/24/2015  . OA (osteoarthritis) of knee 05/07/2015  . 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 Major depressive disorder, single episode 10/14/2010    Past Surgical History:  Procedure Laterality Date  . ABDOMINAL HYSTERECTOMY  2001  . BLADDER SUSPENSION    . BUNIONECTOMY Left 2000  . CESAREAN SECTION  1987; 1988  . COLONOSCOPY    . JOINT REPLACEMENT    . MASS EXCISION  10/22/2011   Procedure: EXCISION MASS;  Surgeon: [name], MD;  Location: [place];  Service: General;  Laterality: Right;  excision right buttock mass  . TOTAL HIP ARTHROPLASTY Left 1993; 1995; 2000  . TOTAL KNEE ARTHROPLASTY Left 05/07/2015   Procedure: TOTAL LEFT KNEE ARTHROPLASTY;  Surgeon: [name], MD;  Location: [facility];  Service: Orthopedics;  Laterality: Left;  . TOTAL KNEE  ARTHROPLASTY Right 10/01/2015   Procedure: RIGHT TOTAL KNEE ARTHROPLASTY;  Surgeon: [name], MD;  Location: [facility];  Service: Orthopedics;  Laterality: Right;   Allergies Acetaminophen; Benadryl [diphenhydramine hcl]; Codeine; Dilantin [phenytoin sodium extended]; Melatonin; Tramadol; Ultram [tramadol hcl]; Vimpat [lacosamide]; Betadine [povidone iodine]; Latex; Penicillins; Sulfa antibiotics; Tape; and Vicodin [hydrocodone-acetaminophen]  Family History  Problem Relation Age of Onset  . Cancer Mother   . Depression Father   . Cancer Brother   . Diabetes Brother   . Cancer Maternal Aunt   . Diabetes Maternal Aunt   . Bipolar disorder Son   . Drug  abuse Son   . Heart attack Sister 17       during severe illness    Social History Social History   Tobacco Use  . Smoking status: Never Smoker  . Smokeless tobacco: Never Used  Substance Use Topics  . Alcohol use: No    Alcohol/week: 0.0 standard drinks    Comment: 12-25-2015 per pt no  . Drug use: No    Comment: 21-12-2015 per pt no     Review of Systems  Constitutional: No fever/chills Eyes: No visual changes. ENT: No sore throat. Cardiovascular: Denies chest pain. Respiratory: Denies shortness of breath. Gastrointestinal: No abdominal pain.  No nausea, no vomiting.  No diarrhea.  No constipation. Genitourinary: Negative for dysuria. Musculoskeletal: Negative for back pain. Skin: Negative for rash. Neurological: Negative for headaches, focal weakness or numbness. Psychiatric:Positive depression and suicidal ideation.   10-point ROS otherwise negative.  ____________________________________________   PHYSICAL EXAM:  VITAL SIGNS: ED Triage Vitals  Enc Vitals Group     BP 10/29/17 1512 138/85     Pulse Rate 10/29/17 1512 71     Resp 10/29/17 1512 13     Temp 10/29/17 1512 98.2 F (36.8 C)     Temp Source 10/29/17 1512 Oral     SpO2 10/29/17 1512 97 %     Weight 10/29/17 1513 165 lb (74.8 kg)     Height 10/29/17 1513 5' (1.524 m)     Pain Score 10/29/17 1513 0   Constitutional: Alert and oriented. Tearful during the exam and history.  Eyes: Conjunctivae are normal. PERRL.  Head: Atraumatic. Nose: No congestion/rhinnorhea. Mouth/Throat: Mucous membranes are moist. Neck: No stridor.   Cardiovascular: Normal rate, regular rhythm. Good peripheral circulation. Grossly normal heart sounds.   Respiratory: Normal respiratory effort.  No retractions. Lungs CTAB. Gastrointestinal: Soft and nontender. No distention.  Musculoskeletal: No lower extremity tenderness nor edema. No gross deformities of extremities. Neurologic:  Normal speech and language. No gross focal  neurologic deficits are appreciated.  Skin:  Skin is warm, dry and intact. No rash noted.  ____________________________________________   LABS (all labs ordered are listed, but only abnormal results are displayed)  Labs Reviewed  COMPREHENSIVE METABOLIC PANEL - Abnormal; Notable for the following components:      Result Value   Potassium 3.3 (*)    Glucose, Bld 107 (*)    AST 121 (*)    ALT 126 (*)    All other components within normal limits  CBC WITH DIFFERENTIAL/PLATELET - Abnormal; Notable for the following components:   Hemoglobin 11.9 (*)    All other components within normal limits  ACETAMINOPHEN LEVEL - Abnormal; Notable for the following components:   Acetaminophen (Tylenol), Serum <10 (*)    All other components within normal limits  ACETAMINOPHEN LEVEL - Abnormal; Notable for the following components:   Acetaminophen (Tylenol), Serum <10 (*)  All other components within normal limits  LIPASE, BLOOD  SALICYLATE LEVEL  ETHANOL  RAPID URINE DRUG SCREEN, HOSP PERFORMED   ____________________________________________  EKG   EKG Interpretation  Date/Time:  Thursday October 29 2017 15:10:00 EDT Ventricular Rate:  71 PR Interval:    QRS Duration: 102 QT Interval:  413 QTC Calculation: 449 R Axis:   74 Text Interpretation:  Sinus rhythm Borderline short PR interval Borderline repolarization abnormality No STEMI.  Confirmed by Nanda Quinton [phone]) on 10/29/2017 3:45:21 PM       ____________________________________________  RADIOLOGY  None ____________________________________________   PROCEDURES  Procedure(s) performed:   Procedures   ____________________________________________   INITIAL IMPRESSION / ASSESSMENT AND PLAN / ED COURSE  Pertinent labs & imaging results that were available during my care of the patient were reviewed by me and considered in my medical decision making (see chart for details).  Presents to the emergency department  with suicidal ideation.  She was IVC by the sheriff when she called to alert them that she was feeling depressed and suicidal.  She did take 3 caffeine pills "stackers" in an attempt to relieve her depression today but states this was not a suicide attempt.  She is not experiencing any chest pain or shortness of breath.  No heart palpitations.  Plan to obtain screening labs including EKG with the caffeine pill ingestion and uphold IVC.   12:51 AM Patient's labs reviewed.  AST/ALT are slightly elevated without bilirubin elevation.  The patient does not have abdominal pain, fever.  Plan to obtain a 4-hour Tylenol but low suspicion for Tylenol ingestion here.  Patient will require follow-up with PCP for further liver testing.  Repeat tylenol negative. Patient is medically clear. Psychiatry recommending inpatient treatment.  ____________________________________________  FINAL CLINICAL IMPRESSION(S) / ED DIAGNOSES  Final diagnoses:  Suicidal ideation  Elevated LFTs     MEDICATIONS GIVEN DURING THIS VISIT:  Medications  FLUoxetine (PROZAC) capsule 60 mg (60 mg Oral Given 10/29/17 1650)  gabapentin (NEURONTIN) capsule 600 mg (600 mg Oral Given 10/29/17 2135)  lamoTRIgine (LAMICTAL) tablet 150 mg (150 mg Oral Given 10/29/17 2133)  levETIRAcetam (KEPPRA) tablet 1,000 mg (1,000 mg Oral Given 10/29/17 2134)  LORazepam (ATIVAN) tablet 1 mg (1 mg Oral Given 10/29/17 2021)  meclizine (ANTIVERT) tablet 25 mg (25 mg Oral Given 10/29/17 2021)  mirtazapine (REMERON) tablet 7.5 mg (7.5 mg Oral Given 10/29/17 2134)  pramipexole (MIRAPEX) tablet 0.25 mg (0.25 mg Oral Given 10/29/17 2136)    Note:  This document was prepared using Dragon voice recognition software and may include unintentional dictation errors.  Nanda Quinton, MD Emergency Medicine    Long, [facility], MD 10/30/17 [phone]

## 2017-10-29 NOTE — ED Notes (Signed)
IVC paperwork faxed to [facility] 

## 2017-10-29 NOTE — ED Notes (Signed)
Patient given evening and night time medications. Patient also given water and ice cream.

## 2017-10-29 NOTE — ED Notes (Signed)
Pt ate dinner tray and now has visitors

## 2017-10-29 NOTE — ED Notes (Signed)
Behavioral Health called and states will recommend inpatient treatment for patient.

## 2017-10-29 NOTE — Progress Notes (Signed)
Pt meets inpatient criteria per Earleen Newport, NP. Referral information has been sent to the following hospitals for review:  [facility]  [place]  [place]  [facility]  [facility]   Disposition will continue to assist with placement needs.   Audree Camel, LCSW, [place] [facility]/TTS [phone]

## 2017-10-29 NOTE — ED Triage Notes (Signed)
Pt brought to ED via [facility]. Per EMS, pt was being committed for depression and Sheriff's Deputy states he thought she had a seizure that lasted about 15 seconds. Per EMS, no signs of seizure following. Pt states she does have a history of seizures. Pt states she is very depressed with the passing of her husband and wants "to be with him." Pt admits to SI today. Pt with unknown type of pill under left breast when being hooked up to the cardiac monitor. Pt states she took 3 stacker 3's today but she "doesn't know why she took so many". Pt states she wants to hurt herself and she thinks her medicine makes her crazy.

## 2017-10-29 NOTE — BH Assessment (Signed)
Tele Assessment Note   Patient Name: [name] MRN: [id number] Referring Physician: Dr. Laverta Baltimore Location of Patient: [facility] Location of Provider: [place] is an 52 y.o. female. Pt reports SI. Pt attempted to overdose on her prescription medication. Pt reports a previous SI attempt. Pt reports 1 previous inpatient admission. Pt denies HI and AVH. Pt states her husband passed away in 08-27-17 and she's having a difficult time processing his death. The Pt states that she was looking at her deceased husband's pictures today and became extremely depressed. Per Pt she contacted the [facility] department and informed them that she was depressed and suicidal. Pt denies current mental health treatment. Pt denies current mental health medication. The [facility] department transported her to [place]. The Pt denies SA.   Shuvon, NP recommends inpatient treatment.   Diagnosis: F33.2 MDD  Past Medical History:  Past Medical History:  Diagnosis Date  . Arthritis    "knees" (04/22/2012)  . Chronic bronchitis ([place])    "yearly; when the weather changes" (04/22/2012)  . Colon polyp   . Colon polyps    adenomatous and hyperplastic-  . Depression   . Eczema   . Epilepsy ([place])    "been having them right often here lately" (04/22/2012)  . Fatty liver   . High cholesterol   . History of kidney stones   . Osteoarthritis    [name] 04/22/2012  . Other convulsions 05/21/12   non-epileptic spells  . Restless leg   . Seizures ([place])   . Vertigo     Past Surgical History:  Procedure Laterality Date  . ABDOMINAL HYSTERECTOMY  2001  . BLADDER SUSPENSION    . BUNIONECTOMY Left 2000  . CESAREAN SECTION  1987; 1988  . COLONOSCOPY    . JOINT REPLACEMENT    . MASS EXCISION  10/22/2011   Procedure: EXCISION MASS;  Surgeon: [name], MD;  Location: [place];  Service: General;  Laterality: Right;  excision right buttock mass  . TOTAL HIP ARTHROPLASTY Left 1993; 1995;  2000  . TOTAL KNEE ARTHROPLASTY Left 05/07/2015   Procedure: TOTAL LEFT KNEE ARTHROPLASTY;  Surgeon: [name], MD;  Location[facility];  Service: Orthopedics;  Laterality: Left;  . TOTAL KNEE ARTHROPLASTY Right 10/01/2015   Procedure: RIGHT TOTAL KNEE ARTHROPLASTY;  Surgeon: [name], MD;  Location: [facility];  Service: Orthopedics;  Laterality: Right;    Family History:  Family History  Problem Relation Age of Onset  . Cancer Mother   . Depression Father   . Cancer Brother   . Diabetes Brother   . Cancer Maternal Aunt   . Diabetes Maternal Aunt   . Bipolar disorder Son   . Drug abuse Son   . Heart attack Sister 6       during severe illness    Social History:  reports that she has never smoked. She has never used smokeless tobacco. She reports that she does not drink alcohol or use drugs.  Additional Social History:  Alcohol / Drug Use Pain Medications: please see mar Prescriptions: please see mar Over the Counter: please see mar History of alcohol / drug use?: No history of alcohol / drug abuse Longest period of sobriety (when/how long): NA  CIWA: CIWA-Ar BP: 138/85 Pulse Rate: 71 COWS:    Allergies:  Allergies  Allergen Reactions  . Acetaminophen Other (See Comments)    Has fatty deposits on liver  . Benadryl [Diphenhydramine Hcl] Other (See Comments)  hyperactivity and seizures  . Codeine Itching and Rash    seizures  . Dilantin [Phenytoin Sodium Extended] Other (See Comments)    Elevated LFT's  . Melatonin Other (See Comments)    seizures  . Tramadol Other (See Comments)    "causes seizures"  . Ultram [Tramadol Hcl] Other (See Comments)    Seizures  . Vimpat [Lacosamide] Other (See Comments)    Severe dizziness  . Betadine [Povidone Iodine] Rash  . Latex Rash  . Penicillins Itching and Rash    Has patient had a PCN reaction causing immediate rash, facial/tongue/throat swelling, SOB or lightheadedness with hypotension: No Has patient had a PCN  reaction causing severe rash involving mucus membranes or skin necrosis: No Has patient had a PCN reaction that required hospitalization No Has patient had a PCN reaction occurring within the last 10 years: No If all of the above answers are "NO", then may proceed with Cephalosporin use.  . Sulfa Antibiotics Rash  . Tape Rash    Paper tape please  . Vicodin [Hydrocodone-Acetaminophen] Itching    Home Medications:  (Not in a hospital admission)  OB/GYN Status:  No LMP recorded. Patient has had a hysterectomy.  General Assessment Data [facility] of Assessment: [facility] TTS Assessment: In system Is this a Tele or Face-to-Face Assessment?: Tele Assessment Is this an Initial Assessment or a Re-assessment for this encounter?: Initial Assessment Patient Accompanied by:: N/A Language Other than English: No Living Arrangements: Other (Comment) What gender do you identify as?: Female Marital status: Widowed Pregnancy Status: No Living Arrangements: Alone Can pt return to current living arrangement?: Yes Admission Status: Voluntary Is patient capable of signing voluntary admission?: Yes Referral Source: Self/Family/Friend Insurance type: [employer]     Crisis Care Plan Living Arrangements: Alone Legal Guardian: Other:(self) Name of Psychiatrist: NA Name of Therapist: NA  Education Status Is patient currently in school?: No Is the patient employed, unemployed or receiving disability?: Unemployed  Risk to self with the past 6 months Suicidal Ideation: Yes-Currently Present Has patient been a risk to self within the past 6 months prior to admission? : No Suicidal Intent: Yes-Currently Present Has patient had any suicidal intent within the past 6 months prior to admission? : No Is patient at risk for suicide?: Yes Suicidal Plan?: Yes-Currently Present Has patient had any suicidal plan within the past 6 months prior to admission? : No Specify Current Suicidal Plan: Pt  attempted to overdose Access to Means: Yes Specify Access to Suicidal Means: access to pills What has been your use of drugs/alcohol within the last 12 months?: NA Previous Attempts/Gestures: No How many times?: 0 Other Self Harm Risks: NA Triggers for Past Attempts: None known Intentional Self Injurious Behavior: None Family Suicide History: No Recent stressful life event(s): Loss (Comment) Persecutory voices/beliefs?: No Depression: Yes Depression Symptoms: Tearfulness, Isolating, Loss of interest in usual pleasures, Feeling worthless/self pity, Feeling angry/irritable Substance abuse history and/or treatment for substance abuse?: No Suicide prevention information given to non-admitted patients: Not applicable  Risk to Others within the past 6 months Homicidal Ideation: No Does patient have any lifetime risk of violence toward others beyond the six months prior to admission? : No Thoughts of Harm to Others: No Current Homicidal Intent: No Current Homicidal Plan: No Access to Homicidal Means: No Identified Victim: NA History of harm to others?: No Assessment of Violence: None Noted Violent Behavior Description: NA Does patient have access to weapons?: No Criminal Charges Pending?: No Does patient have a court  date: No Is patient on probation?: No  Psychosis Hallucinations: None noted Delusions: None noted  Mental Status Report Appearance/Hygiene: Unremarkable Eye Contact: Fair Motor Activity: Freedom of movement Speech: Logical/coherent Level of Consciousness: Alert Mood: Depressed Affect: Depressed Anxiety Level: Minimal Thought Processes: Coherent, Relevant Judgement: Unimpaired Orientation: Person, Place, Time, Situation Obsessive Compulsive Thoughts/Behaviors: None  Cognitive Functioning Concentration: Normal Memory: Recent Intact, Remote Intact Is patient IDD: No Insight: Fair Impulse Control: Fair Appetite: Fair Have you had any weight changes? : No  Change Sleep: No Change Total Hours of Sleep: 8 Vegetative Symptoms: None  ADLScreening [facility] Assessment Services) Patient's cognitive ability adequate to safely complete daily activities?: Yes Patient able to express need for assistance with ADLs?: Yes Independently performs ADLs?: Yes (appropriate for developmental age)  Prior Inpatient Therapy Prior Inpatient Therapy: Yes Prior Therapy Dates: unknown Prior Therapy Facilty/Provider(s): unknown Reason for Treatment: SI attempt  Prior Outpatient Therapy Prior Outpatient Therapy: Yes Prior Therapy Dates: current Prior Therapy Facilty/Provider(s): [facility] outpatient Reason for Treatment: depression Does patient have an ACCT team?: No Does patient have Intensive In-House Services?  : No Does patient have [facility] services? : No Does patient have P4CC services?: No  ADL Screening (condition at time of admission) Patient's cognitive ability adequate to safely complete daily activities?: Yes Does the patient have difficulty seeing, even when wearing glasses/contacts?: No Does the patient have difficulty concentrating, remembering, or making decisions?: No Patient able to express need for assistance with ADLs?: Yes Does the patient have difficulty dressing or bathing?: No Independently performs ADLs?: Yes (appropriate for developmental age) Does the patient have difficulty walking or climbing stairs?: No       Abuse/Neglect Assessment (Assessment to be complete while patient is alone) Abuse/Neglect Assessment Can Be Completed: Yes Physical Abuse: Denies Verbal Abuse: Denies Sexual Abuse: Denies Exploitation of patient/patient's resources: Denies     Regulatory affairs officer (For Healthcare) Does Patient Have a Medical Advance Directive?: No Would patient like information on creating a medical advance directive?: No - Patient declined          Disposition:  Disposition Initial Assessment Completed for this Encounter:  Yes  This service was provided via telemedicine using a 2-way, interactive audio and video technology.  Names of all persons participating in this telemedicine service and their role in this encounter.  Role:   Name: Shuvon Rankin Role: NP  Name:  Role:   Name:  Role:     Cyndia Bent 10/29/2017 5:31 PM

## 2017-10-30 ENCOUNTER — Other Ambulatory Visit: Payer: Self-pay

## 2017-10-30 ENCOUNTER — Inpatient Hospital Stay (HOSPITAL_COMMUNITY)
Admission: AD | Admit: 2017-10-30 | Discharge: 2017-11-04 | DRG: 885 | Disposition: A | Discharge status: Home / Self Care | Payer: PPO | Attending: Emergency Medicine | Admitting: Emergency Medicine

## 2017-10-30 ENCOUNTER — Encounter (HOSPITAL_COMMUNITY): Payer: Self-pay

## 2017-10-30 DIAGNOSIS — F445 Conversion disorder with seizures or convulsions: Secondary | ICD-10-CM

## 2017-10-30 DIAGNOSIS — Z880 Allergy status to penicillin: Secondary | ICD-10-CM | POA: Diagnosis not present

## 2017-10-30 DIAGNOSIS — G2581 Restless legs syndrome: Secondary | ICD-10-CM | POA: Diagnosis present

## 2017-10-30 DIAGNOSIS — G4730 Sleep apnea, unspecified: Secondary | ICD-10-CM | POA: Diagnosis present

## 2017-10-30 DIAGNOSIS — J42 Unspecified chronic bronchitis: Secondary | ICD-10-CM | POA: Diagnosis present

## 2017-10-30 DIAGNOSIS — G40209 Localization-related (focal) (partial) symptomatic epilepsy and epileptic syndromes with complex partial seizures, not intractable, without status epilepticus: Secondary | ICD-10-CM | POA: Diagnosis not present

## 2017-10-30 DIAGNOSIS — K760 Fatty (change of) liver, not elsewhere classified: Secondary | ICD-10-CM | POA: Diagnosis not present

## 2017-10-30 DIAGNOSIS — F332 Major depressive disorder, recurrent severe without psychotic features: Secondary | ICD-10-CM | POA: Diagnosis not present

## 2017-10-30 DIAGNOSIS — Z915 Personal history of self-harm: Secondary | ICD-10-CM | POA: Diagnosis not present

## 2017-10-30 DIAGNOSIS — Z79899 Other long term (current) drug therapy: Secondary | ICD-10-CM

## 2017-10-30 DIAGNOSIS — Z90710 Acquired absence of both cervix and uterus: Secondary | ICD-10-CM

## 2017-10-30 DIAGNOSIS — Z818 Family history of other mental and behavioral disorders: Secondary | ICD-10-CM

## 2017-10-30 DIAGNOSIS — R45851 Suicidal ideations: Secondary | ICD-10-CM | POA: Diagnosis not present

## 2017-10-30 DIAGNOSIS — Z634 Disappearance and death of family member: Secondary | ICD-10-CM

## 2017-10-30 DIAGNOSIS — F411 Generalized anxiety disorder: Secondary | ICD-10-CM | POA: Diagnosis present

## 2017-10-30 DIAGNOSIS — Z96642 Presence of left artificial hip joint: Secondary | ICD-10-CM | POA: Diagnosis not present

## 2017-10-30 DIAGNOSIS — F29 Unspecified psychosis not due to a substance or known physiological condition: Secondary | ICD-10-CM | POA: Diagnosis not present

## 2017-10-30 DIAGNOSIS — R51 Headache: Secondary | ICD-10-CM | POA: Diagnosis present

## 2017-10-30 DIAGNOSIS — F5101 Primary insomnia: Secondary | ICD-10-CM | POA: Diagnosis not present

## 2017-10-30 DIAGNOSIS — Z8249 Family history of ischemic heart disease and other diseases of the circulatory system: Secondary | ICD-10-CM | POA: Diagnosis not present

## 2017-10-30 DIAGNOSIS — Z882 Allergy status to sulfonamides status: Secondary | ICD-10-CM | POA: Diagnosis not present

## 2017-10-30 DIAGNOSIS — F322 Major depressive disorder, single episode, severe without psychotic features: Secondary | ICD-10-CM | POA: Diagnosis not present

## 2017-10-30 DIAGNOSIS — G40909 Epilepsy, unspecified, not intractable, without status epilepticus: Secondary | ICD-10-CM | POA: Diagnosis not present

## 2017-10-30 DIAGNOSIS — Z886 Allergy status to analgesic agent status: Secondary | ICD-10-CM

## 2017-10-30 DIAGNOSIS — Z885 Allergy status to narcotic agent status: Secondary | ICD-10-CM | POA: Diagnosis not present

## 2017-10-30 DIAGNOSIS — Z811 Family history of alcohol abuse and dependence: Secondary | ICD-10-CM

## 2017-10-30 DIAGNOSIS — Z833 Family history of diabetes mellitus: Secondary | ICD-10-CM | POA: Diagnosis not present

## 2017-10-30 DIAGNOSIS — Z96653 Presence of artificial knee joint, bilateral: Secondary | ICD-10-CM | POA: Diagnosis present

## 2017-10-30 DIAGNOSIS — R569 Unspecified convulsions: Secondary | ICD-10-CM | POA: Diagnosis not present

## 2017-10-30 DIAGNOSIS — Z888 Allergy status to other drugs, medicaments and biological substances status: Secondary | ICD-10-CM

## 2017-10-30 DIAGNOSIS — R404 Transient alteration of awareness: Secondary | ICD-10-CM | POA: Diagnosis not present

## 2017-10-30 DIAGNOSIS — F419 Anxiety disorder, unspecified: Secondary | ICD-10-CM | POA: Diagnosis not present

## 2017-10-30 DIAGNOSIS — Z23 Encounter for immunization: Secondary | ICD-10-CM | POA: Diagnosis not present

## 2017-10-30 DIAGNOSIS — Z91040 Latex allergy status: Secondary | ICD-10-CM

## 2017-10-30 DIAGNOSIS — Z91048 Other nonmedicinal substance allergy status: Secondary | ICD-10-CM

## 2017-10-30 MED ORDER — LAMOTRIGINE 150 MG PO TABS
150.0000 mg | ORAL_TABLET | Freq: Two times a day (BID) | ORAL | Status: DC
  Administered 2017-10-30 – 2017-11-04 (×10): 150 mg via ORAL
  Filled 2017-10-30 (×17): qty 1

## 2017-10-30 MED ORDER — LEVETIRACETAM 750 MG PO TABS
1500.0000 mg | ORAL_TABLET | Freq: Every day | ORAL | Status: DC
  Administered 2017-10-30 – 2017-11-03 (×5): 1500 mg via ORAL
  Filled 2017-10-30 (×4): qty 2
  Filled 2017-10-30: qty 6
  Filled 2017-10-30 (×4): qty 2

## 2017-10-30 MED ORDER — HYDROXYZINE HCL 25 MG PO TABS
25.0000 mg | ORAL_TABLET | Freq: Three times a day (TID) | ORAL | Status: DC | PRN
  Administered 2017-10-30 – 2017-11-03 (×5): 25 mg via ORAL
  Filled 2017-10-30 (×5): qty 1

## 2017-10-30 MED ORDER — TRAZODONE HCL 50 MG PO TABS: 50.0000 mg | ORAL_TABLET | Freq: Every evening | ORAL | Status: DC | PRN

## 2017-10-30 MED ORDER — GABAPENTIN 300 MG PO CAPS
600.0000 mg | ORAL_CAPSULE | Freq: Three times a day (TID) | ORAL | Status: DC
  Administered 2017-10-30 – 2017-11-04 (×14): 600 mg via ORAL
  Filled 2017-10-30 (×25): qty 2

## 2017-10-30 MED ORDER — LEVETIRACETAM 750 MG PO TABS: 1500.0000 mg | ORAL_TABLET | Freq: Every day | ORAL | Status: DC

## 2017-10-30 MED ORDER — MIRTAZAPINE 15 MG PO TABS
15.0000 mg | ORAL_TABLET | Freq: Every day | ORAL | Status: DC
  Administered 2017-10-30: 15 mg via ORAL
  Filled 2017-10-30 (×4): qty 1

## 2017-10-30 MED ORDER — IBUPROFEN 400 MG PO TABS
400.0000 mg | ORAL_TABLET | Freq: Once | ORAL | Status: AC
  Administered 2017-10-30: 400 mg via ORAL
  Filled 2017-10-30: qty 1

## 2017-10-30 MED ORDER — POTASSIUM CHLORIDE CRYS ER 20 MEQ PO TBCR
40.0000 meq | EXTENDED_RELEASE_TABLET | Freq: Once | ORAL | Status: AC
  Administered 2017-10-30: 40 meq via ORAL
  Filled 2017-10-30: qty 2

## 2017-10-30 MED ORDER — MAGNESIUM HYDROXIDE 400 MG/5ML PO SUSP: 30.0000 mL | Freq: Every day | ORAL | Status: DC | PRN

## 2017-10-30 MED ORDER — ALUM & MAG HYDROXIDE-SIMETH 200-200-20 MG/5ML PO SUSP: 30.0000 mL | ORAL | Status: DC | PRN

## 2017-10-30 MED ORDER — LEVETIRACETAM 500 MG PO TABS: 1000.0000 mg | ORAL_TABLET | Freq: Every day | ORAL | Status: DC

## 2017-10-30 MED ORDER — PRAMIPEXOLE DIHYDROCHLORIDE 0.25 MG PO TABS
0.5000 mg | ORAL_TABLET | Freq: Two times a day (BID) | ORAL | Status: DC
  Administered 2017-10-30 – 2017-11-04 (×10): 0.5 mg via ORAL
  Filled 2017-10-30 (×17): qty 2

## 2017-10-30 MED ORDER — LEVETIRACETAM 500 MG PO TABS
1000.0000 mg | ORAL_TABLET | Freq: Every morning | ORAL | Status: DC
  Administered 2017-10-31 – 2017-11-04 (×5): 1000 mg via ORAL
  Filled 2017-10-30 (×9): qty 2

## 2017-10-30 NOTE — Tx Team (Signed)
Initial Treatment Plan 10/30/2017 5:56 PM DOROTHEA YOW TTC:[id number]    PATIENT STRESSORS: Financial difficulties Health problems Loss of significant other.   PATIENT STRENGTHS: Capable of independent living Communication skills Supportive family/friends   PATIENT IDENTIFIED PROBLEMS: "Stress"  "How to live again"  Depression  Suicidal Ideation  Anxiety             DISCHARGE CRITERIA:  Ability to meet basic life and health needs Motivation to continue treatment in a less acute level of care Verbal commitment to aftercare and medication compliance  PRELIMINARY DISCHARGE PLAN: Attend aftercare/continuing care group Outpatient therapy Return to previous living arrangement  PATIENT/FAMILY INVOLVEMENT: This treatment plan has been presented to and reviewed with the patient, LYNNIAH JANOSKI, and/or family member.  The patient and family have been given the opportunity to ask questions and make suggestions.  Vela Prose, RN 10/30/2017, 5:56 PM

## 2017-10-30 NOTE — ED Notes (Signed)
Pt given breakfast tray

## 2017-10-30 NOTE — ED Notes (Signed)
Pt woke up and asked RN to look at left ear stating "somethings bothering my ear." RN assessed both ears and reassured Pt there was a little wax buildup but otherwise normal.

## 2017-10-30 NOTE — Progress Notes (Signed)
Re-assessment: Patient was alert and tearful during assessment. Patient reviewed with clinician that she is feeling better today and is no longer suicidal. She stated that she was depressed due to the recent loss of her husband. Clinician staffed with Earleen Newport, NP. Per NP patient continues to meet in patient criteria due to concerns about her ongoing SI, grief, and being restarted on medications. Patient has been accepted to [facility].

## 2017-10-30 NOTE — Progress Notes (Signed)
Writer has observed patient up in the dayroom interacting with peers. Writer spoke with her 1:1 and she reports that she needs to learn to grieve since her husbands death. She reports that her family is very supportive and they all live in close proximity of each other. She was informed of her medications at hs. Support given and safety maintained on unit with 15 min checks.

## 2017-10-30 NOTE — Progress Notes (Signed)
Admission Note: Patient is a 52 year old female admitted to the unit for symptoms of depression, anxiety and suicidal ideation.  Patient currently denies suicidal thoughts and verbally contracts for safety while in the hospital.  Patient reports husband recently passed away after long illness and have been married for over thirty years.  Reports stressor of being alone and hopeless.  Patient presents with anxious affect and depressed mood.  Admission plan of care reviewed and consent signed.  Personal belongings and skin assessment completed.  Skin is dry and intact.  No contraband found.  Patient oriented to the unit, staff and room.  Routine safety checks initiated.  Support and encouragement offered as needed.  Verbalizes understanding of unit rules and protocol.  Patient is safe on the unit.

## 2017-10-30 NOTE — ED Provider Notes (Addendum)
Patient resting comfortably.  Stable.  Oral potassium supplementation ordered   Orlie Dakin, MD 10/30/17 0704  8:35 AM complains of diffuse headache which she reports started gradually approximately 2 hours ago she is alert and awake, Glasgow Coma Score and appears in no distress . ibuprofen ordered    Orlie Dakin, MD 10/30/17 [phone]

## 2017-10-30 NOTE — Progress Notes (Signed)
Pt accepted to [facility], Bed 499 (Actual bed assignment pending)  Shuvon Rankin, NP is the accepting provider.  Neita Garnet, MD, is the attending provider.  Call report to [phone]  [facility] notified.   Pt is IVC  Pt may be transported by [employer] Pt scheduled  to arrive at 15:00  Romie Minus T. Judi Cong, MSW, [place] Disposition Clinical Social Work [phone] (cell) [phone] (office)

## 2017-10-31 DIAGNOSIS — Z634 Disappearance and death of family member: Secondary | ICD-10-CM

## 2017-10-31 DIAGNOSIS — F419 Anxiety disorder, unspecified: Secondary | ICD-10-CM

## 2017-10-31 DIAGNOSIS — F322 Major depressive disorder, single episode, severe without psychotic features: Secondary | ICD-10-CM

## 2017-10-31 MED ORDER — LORAZEPAM 0.5 MG PO TABS
0.5000 mg | ORAL_TABLET | Freq: Three times a day (TID) | ORAL | Status: DC
  Administered 2017-10-31 – 2017-11-04 (×12): 0.5 mg via ORAL
  Filled 2017-10-31 (×12): qty 1

## 2017-10-31 MED ORDER — MIRTAZAPINE 30 MG PO TABS
30.0000 mg | ORAL_TABLET | Freq: Every day | ORAL | Status: DC
  Administered 2017-10-31 – 2017-11-03 (×4): 30 mg via ORAL
  Filled 2017-10-31 (×7): qty 1

## 2017-10-31 NOTE — Progress Notes (Signed)
Adult Psychoeducational Group Note  Date:  10/31/2017 Time:  12:04 AM  Group Topic/Focus:  Wrap-Up Group:   The focus of this group is to help patients review their daily goal of treatment and discuss progress on daily workbooks.  Participation Level:  Active  Participation Quality:  Appropriate  Affect:  Appropriate  Cognitive:  Appropriate  Insight: Appropriate  Engagement in Group:  Engaged  Modes of Intervention:  Discussion  Additional Comments:  Pt was admitted today.  Pt stated that she is grieving the loss of her husband of 34 years.  Pt stated she has not cried today.  Pt rated the day at 4/10.  Lavada Langsam 10/31/2017, 12:04 AM

## 2017-10-31 NOTE — H&P (Signed)
Psychiatric Admission Assessment Adult  Patient Identification: [name] MRN:  [id number] Date of Evaluation:  10/31/2017 Chief Complaint: " very depressed " Principal Diagnosis: MDD, no psychotic features , Bereavement  Diagnosis:   Patient Active Problem List   Diagnosis Date Noted  . MDD (major depressive disorder), severe ([place]) [F32.2] 10/30/2017  . Widowed - Jul 28, 2017 [Z63.4] 08/07/2017  . Sleep apnea [G47.30] 06/09/2017  . Colon polyp [K63.5] 06/05/2017  . Elevated liver function tests [R94.5] 06/05/2017  . Fatty liver [K76.0]   . Mild neurocognitive disorder [G31.84] 02/04/2017  . Primary insomnia [F51.01] 05/19/2016  . Bereavement [Z63.4] 12/25/2015  . Partial symptomatic epilepsy with complex partial seizures, not intractable, with status epilepticus ([place]) [G40.201] 07/24/2015  . Insomnia w/ sleep apnea [G47.00, G47.30] 07/24/2015  . OA (osteoarthritis) of knee [M17.10] 05/07/2015  . 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 Major depressive disorder, single episode [F32.9] 10/14/2010   History of Present Illness: 52 year old female, lives alone, reports husband passed away in 07/28/17. Reports she has been experiencing bereavement and grief symptoms. States " I just kind of felt alone, not seeing my husband in the recliner where he used to sit", states she had obtained a pet dog for company but felt overwhelmed with having to walk the dog several times a day. On day of admission she called EMS reporting suicidal ideations, which she states she had only experienced on that day. Denies any specific suicidal plan or intention.  Reports some neuro-vegetative symptoms as below. Denies psychotic symptoms.  Associated Signs/Symptoms: Depression Symptoms:  depressed mood, anhedonia, insomnia, suicidal thoughts without plan, loss of energy/fatigue, decreased appetite, (Hypo) Manic Symptoms:  Denies  Anxiety Symptoms:  Some increased anxiety recently, some panic attacks.   Psychotic Symptoms:denies  PTSD Symptoms: Denies  Total Time spent with patient: 45 minutes  Past Psychiatric History: reports one prior psychiatric admissions 20 years ago, for depression and anxiety associated to " dealing with a difficult teenager who was on drugs ". States she has never attempted suicide, denies history of self cutting or self injurious behaviors, denies history of violence, denies history of PTSD, denies any history of mania or hypomania.   Is the patient at risk to self? Yes.    Has the patient been a risk to self in the past 6 months? Yes.    Has the patient been a risk to self within the distant past? Yes.    Is the patient a risk to others? No.  Has the patient been a risk to others in the past 6 months? No.  Has the patient been a risk to others within the distant past? No.   Prior Inpatient Therapy:  as above  Prior Outpatient Therapy:  reports she has an established psychiatrist- Dr. Modesta Messing   Alcohol Screening: Patient refused Alcohol Screening Tool: Yes 1. How often do you have a drink containing alcohol?: Never 2. How many drinks containing alcohol do you have on a typical day when you are drinking?: 1 or 2 3. How often do you have six or more drinks on one occasion?: Never AUDIT-C Score: 0 4. How often during the last year have you found that you were not able to stop drinking once you had started?: Never 5. How often during the last year have you failed to do what was normally expected from you becasue of drinking?: Never 6. How often during the last year have you needed a first drink in the morning to get yourself going  after a heavy drinking session?: Never 7. How often during the last year have you had a feeling of guilt of remorse after drinking?: Never 8. How often during the last year have you been unable to remember what happened the night before because you had been drinking?: Never 9. Have you or someone else been injured as a result of your  drinking?: No 10. Has a relative or friend or a doctor or another health worker been concerned about your drinking or suggested you cut down?: No Intervention/Follow-up: Patient Refused Substance Abuse History in the last 12 months:  Denies alcohol or drug abuse  Consequences of Substance Abuse: Denies  Previous Psychotropic Medications: Neurontin 600 mgrs TID for seizure disorder, Lamictal 150 mgrs BID for seizure disorder, Prozac 60 mgrs QDAY for depression( x 2 years) , Remeron 7.5 mgrs QHS for depression ( x 1 year), Ativan 1 mgr BID , states normally took one tablet per day.  Psychological Evaluations:   No Past Medical History: history of epilepsy, reports grand mal seizures since age 41. Of note, patient has elevated AST and ALT- denies alcohol abuse, Hep B and C have been tested for and results negative. Sates " I was told I have fatty liver"  Past Medical History:  Diagnosis Date  . Arthritis    "knees" (04/22/2012)  . Chronic bronchitis ([place])    "yearly; when the weather changes" (04/22/2012)  . Colon polyp   . Colon polyps    adenomatous and hyperplastic-  . Depression   . Eczema   . Epilepsy ([place])    "been having them right often here lately" (04/22/2012)  . Fatty liver   . High cholesterol   . History of kidney stones   . Osteoarthritis    [name] 04/22/2012  . Other convulsions 05/21/12   non-epileptic spells  . Restless leg   . Seizures ([place])   . Vertigo     Past Surgical History:  Procedure Laterality Date  . ABDOMINAL HYSTERECTOMY  03/23/1999  . BLADDER SUSPENSION    . BUNIONECTOMY Left 03/23/1998  . CESAREAN SECTION  1987; 1988  . COLONOSCOPY    . JOINT REPLACEMENT    . MASS EXCISION  10/22/2011   Procedure: EXCISION MASS;  Surgeon: [name], MD;  Location: [facility];  Service: General;  Laterality: Right;  excision right buttock mass  . TOTAL HIP ARTHROPLASTY Left 1993; 1995; 23-Mar-1998  . TOTAL KNEE ARTHROPLASTY Left 05/07/2015   Procedure: TOTAL LEFT KNEE ARTHROPLASTY;   Surgeon: [name], MD;  Location: [facility];  Service: Orthopedics;  Laterality: Left;  . TOTAL KNEE ARTHROPLASTY Right 10/01/2015   Procedure: RIGHT TOTAL KNEE ARTHROPLASTY;  Surgeon: [name], MD;  Location: [facility];  Service: Orthopedics;  Laterality: Right;   Family History: father deceased , died from [place] in 03-22-00, mother alive, and patient states she lives nearby, has two siblings Family History  Problem Relation Age of Onset  . Cancer Mother   . Depression Father   . Cancer Brother   . Diabetes Brother   . Cancer Maternal Aunt   . Diabetes Maternal Aunt   . Bipolar disorder Son   . Drug abuse Son   . Heart attack Sister 39       during severe illness   Family Psychiatric  History: reports her father had history of depression and alcohol abuse . Reports both her sons have history of depression, no suicides in family. States there is a history of alcohol abuse in  family. Tobacco Screening:  Does not smoke or vape  Social History: widowed- husband passed away in July 2019,has two adult children ( 32,20), lives alone, but states her adult son lives close to her.  On disability. Social History   Substance and Sexual Activity  Alcohol Use No  . Alcohol/week: 0.0 standard drinks   Comment: 12-25-2015 per pt no     Social History   Substance and Sexual Activity  Drug Use No   Comment: 21-12-2015 per pt no     Additional Social History: Marital status: Widowed Widowed, when?: 07/15/2017 Are you sexually active?: No What is your sexual orientation?: heterosexual  Has your sexual activity been affected by drugs, alcohol, medication, or emotional stress?: no Does patient have children?: Yes How many children?: 2 How is patient's relationship with their children?: great relationship    Allergies:   Allergies  Allergen Reactions  . Acetaminophen Other (See Comments)    Has fatty deposits on liver  . Benadryl [Diphenhydramine Hcl] Other (See Comments)    hyperactivity and  seizures  . Codeine Itching and Rash    seizures  . Dilantin [Phenytoin Sodium Extended] Other (See Comments)    Elevated LFT's  . Melatonin Other (See Comments)    seizures  . Tramadol Other (See Comments)    "causes seizures"  . Ultram [Tramadol Hcl] Other (See Comments)    Seizures  . Vimpat [Lacosamide] Other (See Comments)    Severe dizziness  . Betadine [Povidone Iodine] Rash  . Latex Rash  . Penicillins Itching and Rash    Has patient had a PCN reaction causing immediate rash, facial/tongue/throat swelling, SOB or lightheadedness with hypotension: No Has patient had a PCN reaction causing severe rash involving mucus membranes or skin necrosis: No Has patient had a PCN reaction that required hospitalization No Has patient had a PCN reaction occurring within the last 10 years: No If all of the above answers are "NO", then may proceed with Cephalosporin use.  . Sulfa Antibiotics Rash  . Tape Rash    Paper tape please  . Vicodin [Hydrocodone-Acetaminophen] Itching   Lab Results:  Results for orders placed or performed during the hospital encounter of 10/29/17 (from the past 48 hour(s))  Urine rapid drug screen (hosp performed)     Status: None   Collection Time: 10/29/17  3:28 PM  Result Value Ref Range   Opiates NONE DETECTED NONE DETECTED   Cocaine NONE DETECTED NONE DETECTED   Benzodiazepines NONE DETECTED NONE DETECTED   Amphetamines NONE DETECTED NONE DETECTED   Tetrahydrocannabinol NONE DETECTED NONE DETECTED   Barbiturates NONE DETECTED NONE DETECTED    Comment: (NOTE) DRUG SCREEN FOR MEDICAL PURPOSES ONLY.  IF CONFIRMATION IS NEEDED FOR ANY PURPOSE, NOTIFY LAB WITHIN 5 DAYS. LOWEST DETECTABLE LIMITS FOR URINE DRUG SCREEN Drug Class                     Cutoff (ng/mL) Amphetamine and metabolites    1000 Barbiturate and metabolites    200 Benzodiazepine                 449 Tricyclics and metabolites     300 Opiates and metabolites        300 Cocaine and  metabolites        300 THC                            50 Performed at [facility], [address], [place], [place] [address]   Comprehensive metabolic panel     Status: Abnormal   Collection Time: 10/29/17  3:46 PM  Result Value Ref Range   Sodium 138 135 - 145 mmol/L   Potassium 3.3 (L) 3.5 - 5.1 mmol/L   Chloride 106 98 - 111 mmol/L   CO2 24 22 - 32 mmol/L   Glucose, Bld 107 (H) 70 - 99 mg/dL   BUN 13 6 - 20 mg/dL   Creatinine, Ser 0.69 0.44 - 1.00 mg/dL   Calcium 9.2 8.9 - 10.3 mg/dL   Total Protein 7.5 6.5 - 8.1 g/dL   Albumin 4.3 3.5 - 5.0 g/dL   AST 121 (H) 15 - 41 U/L   ALT 126 (H) 0 - 44 U/L   Alkaline Phosphatase 67 38 - 126 U/L   Total Bilirubin 0.6 0.3 - 1.2 mg/dL   GFR calc non Af Amer >60 >60 mL/min   GFR calc Af Amer >60 >60 mL/min    Comment: (NOTE) The eGFR has been calculated using the CKD EPI equation. This calculation has not been validated in all clinical situations. eGFR's persistently <60 mL/min signify possible Chronic Kidney Disease.    Anion gap 8 5 - 15    Comment: Performed at [facility], [address], [place], [place] [address]  Lipase, blood     Status: None   Collection Time: 10/29/17  3:46 PM  Result Value Ref Range   Lipase 42 11 - 51 U/L    Comment: Performed at [facility], [address] [place], [place] [address]  CBC with Differential     Status: Abnormal   Collection Time: 10/29/17  3:46 PM  Result Value Ref Range   WBC 8.4 4.0 - 10.5 K/uL   RBC 4.23 3.87 - 5.11 MIL/uL   Hemoglobin 11.9 (L) 12.0 - 15.0 g/dL   HCT 37.7 36.0 - 46.0 %   MCV 89.1 80.0 - 100.0 fL   MCH 28.1 26.0 - 34.0 pg   MCHC 31.6 30.0 - 36.0 g/dL   RDW 13.9 11.5 - 15.5 %   Platelets 332 150 - 400 K/uL   nRBC 0.0 0.0 - 0.2 %   Neutrophils Relative % 62 %   Neutro Abs 5.2 1.7 - 7.7 K/uL   Lymphocytes Relative 28 %   Lymphs Abs 2.4 0.7 - 4.0 K/uL   Monocytes Relative 8 %   Monocytes Absolute 0.7 0.1 - 1.0 K/uL   Eosinophils Relative 1 %   Eosinophils  Absolute 0.1 0.0 - 0.5 K/uL   Basophils Relative 1 %   Basophils Absolute 0.0 0.0 - 0.1 K/uL   Immature Granulocytes 0 %   Abs Immature Granulocytes 0.02 0.00 - 0.07 K/uL    Comment: Performed at [facility], [address] [place],  [address]  Salicylate level     Status: None   Collection Time: 10/29/17  3:46 PM  Result Value Ref Range   Salicylate Lvl <0.7 2.8 - 30.0 mg/dL    Comment: Performed at [facility], [address], [place],  [address]  Acetaminophen level     Status: Abnormal   Collection Time: 10/29/17  3:46 PM  Result Value Ref Range   Acetaminophen (Tylenol), Serum <10 (L) 10 - 30 ug/mL    Comment: (NOTE) Therapeutic concentrations vary significantly. A range of 10-30 ug/mL  may be an effective concentration for many patients. However, some  are best treated at concentrations outside of this  range. Acetaminophen concentrations >150 ug/mL at 4 hours after ingestion  and >50 ug/mL at 12 hours after ingestion are often associated with  toxic reactions. Performed at [facility], [address] [place], [place] [address]   Ethanol     Status: None   Collection Time: 10/29/17  3:46 PM  Result Value Ref Range   Alcohol, Ethyl (B) <10 <10 mg/dL    Comment: (NOTE) Lowest detectable limit for serum alcohol is 10 mg/dL. For medical purposes only. Performed at [facility], [address], [place], [place] [address]   Acetaminophen level     Status: Abnormal   Collection Time: 10/29/17  7:30 PM  Result Value Ref Range   Acetaminophen (Tylenol), Serum <10 (L) 10 - 30 ug/mL    Comment: (NOTE) Therapeutic concentrations vary significantly. A range of 10-30 ug/mL  may be an effective concentration for many patients. However, some  are best treated at concentrations outside of this range. Acetaminophen concentrations >150 ug/mL at 4 hours after ingestion  and >50 ug/mL at 12 hours after ingestion are often associated with  toxic reactions. Performed at [facility], [address], [place], [place] [address]     Blood Alcohol level:  Lab Results  Component Value Date   [facility] <10 10/29/2017   ETH <10 96/78/9381    Metabolic Disorder Labs:  Lab Results  Component Value Date   HGBA1C 5.4 11/28/2015   MPG 108 11/28/2015   No results found for: PROLACTIN Lab Results  Component Value Date   CHOL 151 09/21/2017   TRIG 124.0 09/21/2017   HDL 53.10 09/21/2017   CHOLHDL 3 09/21/2017   VLDL 24.8 09/21/2017   LDLCALC 73 09/21/2017   LDLCALC 86 07/07/2016    Current Medications: Current Facility-Administered Medications  Medication Dose Route Frequency Provider Last Rate Last Dose  . alum & mag hydroxide-simeth (MAALOX/MYLANTA) 200-200-20 MG/5ML suspension 30 mL  30 mL Oral Q4H PRN Money, Darnelle Maffucci B, FNP      . gabapentin (NEURONTIN) capsule 600 mg  600 mg Oral TID Money, Lowry Ram, FNP   600 mg at 10/31/17 0175  . hydrOXYzine (ATARAX/VISTARIL) tablet 25 mg  25 mg Oral TID PRN Money, Lowry Ram, FNP   25 mg at 10/30/17 2001  . lamoTRIgine (LAMICTAL) tablet 150 mg  150 mg Oral BID Money, Lowry Ram, FNP   150 mg at 10/31/17 1025  . levETIRAcetam (KEPPRA) tablet 1,000 mg  1,000 mg Oral q morning - 10a Caleb Prigmore, Myer Peer, MD   1,000 mg at 10/31/17 1018  . levETIRAcetam (KEPPRA) tablet 1,500 mg  1,500 mg Oral QHS Crystale Giannattasio, Myer Peer, MD   1,500 mg at 10/30/17 2140  . magnesium hydroxide (MILK OF MAGNESIA) suspension 30 mL  30 mL Oral Daily PRN Money, Darnelle Maffucci B, FNP      . mirtazapine (REMERON) tablet 15 mg  15 mg Oral QHS Money, Lowry Ram, FNP   15 mg at 10/30/17 2134  . pramipexole (MIRAPEX) tablet 0.5 mg  0.5 mg Oral BID Money, Lowry Ram, FNP   0.5 mg at 10/30/17 2134  . traZODone (DESYREL) tablet 50 mg  50 mg Oral QHS PRN Money, Lowry Ram, FNP       PTA Medications: Medications Prior to Admission  Medication Sig Dispense Refill Last Dose  . FLUoxetine HCl 60 MG TABS Take 60 mg by mouth daily. 90 tablet 0 Past Week at Unknown time  . gabapentin  (NEURONTIN) 600 MG tablet Take 1 tablet (600 mg  total) by mouth 3 (three) times daily. 270 tablet 3 10/29/2017 at Unknown time  . lamoTRIgine (LAMICTAL) 150 MG tablet Take 150 mg by mouth 2 (two) times daily.  5   . levETIRAcetam (KEPPRA) 1000 MG tablet Take 1 tablet (1,000 mg total) by mouth 2 (two) times daily. Take 1 tablet PO in the morning and 1.5 tablets at bedtime 60 tablet 0 10/29/2017 at Unknown time  . loratadine (CLARITIN) 10 MG tablet Take 10 mg by mouth daily.  11   . LORazepam (ATIVAN) 1 MG tablet Take 1 tablet (1 mg total) by mouth 2 (two) times daily as needed for anxiety. 60 tablet 2 Past Week at Unknown time  . mirtazapine (REMERON) 15 MG tablet Take 0.5 tablets (7.5 mg total) by mouth at bedtime. 45 tablet 0 Past Week at Unknown time  . pramipexole (MIRAPEX) 0.5 MG tablet Take 0.5 mg by mouth 2 (two) times daily.  5 Past Week at Unknown time    Musculoskeletal: Strength & Muscle Tone: normal Gait & Station: normal Patient leans: N/A  Psychiatric Specialty Exam: Physical Exam  Review of Systems  Constitutional: Negative.   HENT: Negative.   Eyes: Negative.   Respiratory: Negative.   Cardiovascular: Negative.   Gastrointestinal: Negative for diarrhea, nausea and vomiting.  Genitourinary: Negative.   Musculoskeletal: Negative.   Skin: Negative.   Neurological: Positive for seizures and headaches.  Endo/Heme/Allergies: Negative.   Psychiatric/Behavioral: Positive for depression and suicidal ideas.  All other systems reviewed and are negative.   Blood pressure 117/89, pulse 88, temperature 98 F (36.7 C), temperature source Oral, resp. rate (!) 24, height 5' (1.524 m), weight 74.8 kg, SpO2 98 %.Body mass index is 32.22 kg/m.  General Appearance: Fairly Groomed  Eye Contact:  Good  Speech:  Normal Rate  Volume:  Normal  Mood:  reports " I feel my mood is good today", minimizes depression today  Affect:  more reactive  Thought Process:  Linear and Descriptions of  Associations: Intact  Orientation:  Full (Time, Place, and Person)  Thought Content:  denies hallucinations, no delusions   Suicidal Thoughts:  No denies suicidal or self injurious ideations, denies homicidal or violent ideations   Homicidal Thoughts:  No  Memory:  recent and remote grossly intact   Judgement:  Fair  Insight:  Fair  Psychomotor Activity:  Normal  Concentration:  Concentration: Good and Attention Span: Good  Recall:  Good  Fund of Knowledge:  Good  Language:  Good  Akathisia:  Negative  Handed:  Right  AIMS (if indicated):     Assets:  Desire for Improvement Resilience Social Support  ADL's:  Intact  Cognition:  WNL  Sleep:  Number of Hours: 6.75    Treatment Plan Summary: Daily contact with patient to assess and evaluate symptoms and progress in treatment, Medication management, Plan inpatient treatment and medications as below   Observation Level/Precautions:  15 minute checks  Laboratory:  As needed   Psychotherapy: milieu and group therapy    Medications:  Continue Keppra 100 mgrs QAM and 1500 mgrs QHS, Neurontin 600 mgrs TID ( for seizure disorder) . States she does not feel Prozac is working any longer and wants to change antidepressant .Agrees to titrate Remeron to 30 mgrs QHS , states Remeron " does seem to help". Discussed augmentation strategies, prefers to continue Remeron as monotherapy for depression at this time. States she has been on Ativan for years, between 1-2 mgs per day.  Will continue at 0.5  mgrs TID - hold if sedated - and consider gradual taper.  D/C Prozac Increase Remeron to 30 mgrs QDAY  Start Ativan 0.5 mgrs TID  Consultations:  As needed   Discharge Concerns:    Estimated LOS: 4-5 days   Other:     Physician Treatment Plan for Primary Diagnosis:  MDD Long Term Goal(s): Improvement in symptoms so as ready for discharge  Short Term Goals: Ability to identify changes in lifestyle to reduce recurrence of condition will improve and  Ability to maintain clinical measurements within normal limits will improve  Physician Treatment Plan for Secondary Diagnosis: Active Problems:   MDD (major depressive disorder), severe ([place])  Long Term Goal(s): Improvement in symptoms so as ready for discharge  Short Term Goals: Ability to identify changes in lifestyle to reduce recurrence of condition will improve, Ability to verbalize feelings will improve, Ability to disclose and discuss suicidal ideas, Ability to demonstrate self-control will improve, Ability to identify and develop effective coping behaviors will improve and Ability to maintain clinical measurements within normal limits will improve  I certify that inpatient services furnished can reasonably be expected to improve the patient's condition.    Jenne Campus, MD 10/19/201910:41 AM

## 2017-10-31 NOTE — Progress Notes (Signed)
D.  Pt pleasant on approach, no complaints voiced.  Pt was positive for evening wrap up group, observed engaged in appropriate interaction with peers on the unit.  PT denies SI/HI/AVH at this time.  A.  Support and encouragement offered, medication given as ordered  R. Pt remains safe on the unit, will continue to monitor.

## 2017-10-31 NOTE — [facility] Suicide Risk Assessment (Signed)
[facility] Admission Suicide Risk Assessment   Nursing information obtained from:  Patient Demographic factors:  Unemployed Current Mental Status:  Self-harm thoughts Loss Factors:  Loss of significant relationship Historical Factors:  NA Risk Reduction Factors:  Positive social support  Total Time spent with patient: 45 minutes Principal Problem:  MDD, Bereavement  Diagnosis:   Patient Active Problem List   Diagnosis Date Noted  . MDD (major depressive disorder), severe ([place]) [F32.2] 10/30/2017  . Widowed - July 2019 [Z63.4] 08/07/2017  . Sleep apnea [G47.30] 06/09/2017  . Colon polyp [K63.5] 06/05/2017  . Elevated liver function tests [R94.5] 06/05/2017  . Fatty liver [K76.0]   . Mild neurocognitive disorder [G31.84] 02/04/2017  . Primary insomnia [F51.01] 05/19/2016  . Bereavement [Z63.4] 12/25/2015  . Partial symptomatic epilepsy with complex partial seizures, not intractable, with status epilepticus ([place]) [G40.201] 07/24/2015  . Insomnia w/ sleep apnea [G47.00, G47.30] 07/24/2015  . OA (osteoarthritis) of knee [M17.10] 05/07/2015  . 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 Major depressive disorder, single episode [F32.9] 10/14/2010   Subjective Data:   Continued Clinical Symptoms:    The "Alcohol Use Disorders Identification Test", Guidelines for Use in Primary Care, Second Edition.  World [facility] [facility]). Score between 0-7:  no or low risk or alcohol related problems. Score between 8-15:  moderate risk of alcohol related problems. Score between 16-19:  high risk of alcohol related problems. Score 20 or above:  warrants further diagnostic evaluation for alcohol dependence and treatment.   CLINICAL FACTORS:  52 year old female, widowed in July 2019. Reports history of depression. Reports worsening depression , neuro-vegetative symptoms of depression, passive SI. Medical history remarkable for Epilepsy.  Psychiatric Specialty Exam: Physical Exam  ROS  Blood  pressure 117/89, pulse 88, temperature 98 F (36.7 C), temperature source Oral, resp. rate (!) 24, height 5' (1.524 m), weight 74.8 kg, SpO2 98 %.Body mass index is 32.22 kg/m.  See admit note MSE    COGNITIVE FEATURES THAT CONTRIBUTE TO RISK:  Closed-mindedness and Loss of executive function    SUICIDE RISK:   Moderate:  Frequent suicidal ideation with limited intensity, and duration, some specificity in terms of plans, no associated intent, good self-control, limited dysphoria/symptomatology, some risk factors present, and identifiable protective factors, including available and accessible social support.  PLAN OF CARE: Patient will be admitted to inpatient psychiatric unit for stabilization and safety. Will provide and encourage milieu participation. Provide medication management and maked adjustments as needed.  Will follow daily.    I certify that inpatient services furnished can reasonably be expected to improve the patient's condition.   Jenne Campus, MD 10/31/2017, 11:10 AM

## 2017-10-31 NOTE — Progress Notes (Signed)
D. Pt has been calm and cooperative- reports improving mood. Pt observed interacting well with peers and staff in milieu and attending groups. Pt reports her depression a 5/10 on self inventory and writes that her goal today is "be a little more active" and "groups." Pt currently denies SI/HI and AVH A. Labs and vitals monitored. Pt compliant with medications. Pt supported emotionally and encouraged to express concerns and ask questions.   R. Pt remains safe with 15 minute checks. Will continue POC.

## 2017-10-31 NOTE — BHH Group Notes (Signed)
[facility] Group Notes:  (Nursing)  Date:  10/31/2017  Time:1:15 PM Type of Therapy:  Nurse Education  Participation Level:  Active  Participation Quality:  Appropriate and Attentive  Affect:  Appropriate  Cognitive:  Appropriate  Insight:  Appropriate  Engagement in Group:  Engaged  Modes of Intervention:  Discussion and Education  Summary of Progress/Problems: Nurse led Life Skills group  Waymond Cera 10/31/2017, 5:13 PM
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
## 2017-10-31 NOTE — BHH Group Notes (Signed)
LCSW Group Therapy Note  10/31/2017   10:00-11:00am   Type of Therapy and Topic:  Group Therapy: Anger Cues and Responses  Participation Level:  Active   Description of Group:   In this group, patients learned how to recognize the physical, cognitive, emotional, and behavioral responses they have to anger-provoking situations.  They identified a recent time they became angry and how they reacted.  They analyzed how their reaction was possibly beneficial and how it was possibly unhelpful.  The group discussed a variety of healthier coping skills that could help with such a situation in the future.  Deep breathing was practiced briefly.  Therapeutic Goals: 1. Patients will remember their last incident of anger and how they felt emotionally and physically, what their thoughts were at the time, and how they behaved. 2. Patients will identify how their behavior at that time worked for them, as well as how it worked against them. 3. Patients will explore possible new behaviors to use in future anger situations. 4. Patients will learn that anger itself is normal and cannot be eliminated, and that healthier reactions can assist with resolving conflict rather than worsening situations.  Summary of Patient Progress:  The patient shared that she continues to feel anger that is connected to the death of her husband who died this past 2022/08/24. She described herself as an individual who does not really get mad but on the rare she does she uses journaling as means of coping. The patient stated that during her husband's illness she got away from her writing but recognizes that it was a therapeutic for her. Patient was provided with information and knowledge that there are emotional and physical responses associated with anger. Patient understands that anger is a normal and natural human response. Patient is able to identify coping skills that they can readily access and use to mitigate feelings of  anger.  Therapeutic Modalities:   Cognitive Behavioral Therapy  Rolanda Jay, LCSW  Rolanda Jay

## 2017-11-01 MED ORDER — LORAZEPAM 2 MG/ML IJ SOLN
INTRAMUSCULAR | Status: AC
  Filled 2017-11-01: qty 1

## 2017-11-01 NOTE — Progress Notes (Addendum)
D. Pt presents with a sad affect that brightens upon approach- pleasant during interactions. Pt visible in the milieu interacting well with peers. Per pt's self inventory, pt rates her depression, hopelessness and anxiety a 3/2/1, respectively. Pt writes that her goal today is "going to all groups" and "write a little bit". Pt currently denies SI/HI and AV hallucinations A. Labs and vitals monitored. Pt compliant with medications. Pt supported emotionally and encouraged to express concerns and ask questions.   R. Pt remains safe with 15 minute checks. Will continue POC.

## 2017-11-01 NOTE — Progress Notes (Signed)
Report given to [facility] charge nurse, Chong Sicilian, RN.

## 2017-11-01 NOTE — ED Provider Notes (Signed)
[facility] DEPT Provider Note   CSN: [id number] Arrival date & time: 11/01/17  1323     History   Chief Complaint Chief Complaint  Patient presents with  . Seizures    HPI [name] is a 52 y.o. female.  The history is provided by the patient.  Seizures   This is a chronic problem. The current episode started 1 to 2 hours ago. The problem has been resolved. There were 2 to 3 seizures. The most recent episode lasted less than 30 seconds. Pertinent negatives include no sleepiness, no confusion, no headaches, no speech difficulty, no visual disturbance, no neck stiffness, no sore throat, no chest pain, no cough, no vomiting, no diarrhea and no muscle weakness. Characteristics include rhythmic jerking. Characteristics do not include loss of consciousness. The episode was witnessed. The seizures did not continue in the ED. Possible causes do not include medication or dosage change. There has been no fever. The fever has been present for less than 1 day. There were no medications administered prior to arrival.    Past Medical History:  Diagnosis Date  . Arthritis    "knees" (04/22/2012)  . Chronic bronchitis ([place])    "yearly; when the weather changes" (04/22/2012)  . Colon polyp   . Colon polyps    adenomatous and hyperplastic-  . Depression   . Eczema   . Epilepsy ([place])    "been having them right often here lately" (04/22/2012)  . Fatty liver   . High cholesterol   . History of kidney stones   . Osteoarthritis    [name] 04/22/2012  . Other convulsions 05/21/12   non-epileptic spells  . Restless leg   . Seizures ([place])   . Vertigo     Patient Active Problem List   Diagnosis Date Noted  . MDD (major depressive disorder), severe ([place]) 10/30/2017  . Widowed - July 2019 08/07/2017  . Sleep apnea 06/09/2017  . Colon polyp 06/05/2017  . Elevated liver function tests 06/05/2017  . Fatty liver   . Mild neurocognitive disorder 02/04/2017  .  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 Bereavement 12/25/2015  . Partial symptomatic epilepsy with complex partial seizures, not intractable, with status epilepticus ([place]) 07/24/2015  . Insomnia w/ sleep apnea 07/24/2015  . OA (osteoarthritis) of knee 05/07/2015  . 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[place]) 08/17/2013  . Major depressive disorder, single episode 10/14/2010    Past Surgical History:  Procedure Laterality Date  . ABDOMINAL HYSTERECTOMY  2001  . BLADDER SUSPENSION    . BUNIONECTOMY Left 2000  . CESAREAN SECTION  1987; 1988  . COLONOSCOPY    . JOINT REPLACEMENT    . MASS EXCISION  10/22/2011   Procedure: EXCISION MASS;  Surgeon: [name], MD;  Location: [facility];  Service: General;  Laterality: Right;  excision right buttock mass  . TOTAL HIP ARTHROPLASTY Left 1993; 1995; 2000  . TOTAL KNEE ARTHROPLASTY Left 05/07/2015   Procedure: TOTAL LEFT KNEE ARTHROPLASTY;  Surgeon: [name], MD;  Location: [facility];  Service: Orthopedics;  Laterality: Left;  . TOTAL KNEE ARTHROPLASTY Right 10/01/2015   Procedure: RIGHT TOTAL KNEE ARTHROPLASTY;  Surgeon: [name], MD;  Location: [facility];  Service: Orthopedics;  Laterality: Right;     OB History   None      Home Medications    Prior to Admission medications   Medication Sig Start Date End Date Taking? Authorizing Provider  FLUoxetine HCl 60 MG TABS Take 60 mg by mouth daily. 09/03/17  Norman Clay, MD  gabapentin (NEURONTIN) 600 MG tablet Take 1 tablet (600 mg total) by mouth 3 (three) times daily. 09/15/17   [name], MD  lamoTRIgine (LAMICTAL) 150 MG tablet Take 150 mg by mouth 2 (two) times daily. 10/20/17   [provider]  levETIRAcetam (KEPPRA) 1000 MG tablet Take 1 tablet (1,000 mg total) by mouth 2 (two) times daily. Take 1 tablet PO in the morning and 1.5 tablets at bedtime 08/22/17   Montine Circle, PA-C  loratadine (CLARITIN) 10 MG tablet Take 10 mg by mouth daily. 09/17/17   [provider]  LORazepam (ATIVAN) 1  MG tablet Take 1 tablet (1 mg total) by mouth 2 (two) times daily as needed for anxiety. 09/03/17   Norman Clay, MD  mirtazapine (REMERON) 15 MG tablet Take 0.5 tablets (7.5 mg total) by mouth at bedtime. 09/03/17   Norman Clay, MD  pramipexole (MIRAPEX) 0.5 MG tablet Take 0.5 mg by mouth 2 (two) times daily. 10/17/17   [provider]    Family History Family History  Problem Relation Age of Onset  . Cancer Mother   . Depression Father   . Cancer Brother   . Diabetes Brother   . Cancer Maternal Aunt   . Diabetes Maternal Aunt   . Bipolar disorder Son   . Drug abuse Son   . Heart attack Sister 41       during severe illness    Social History Social History   Tobacco Use  . Smoking status: Never Smoker  . Smokeless tobacco: Never Used  Substance Use Topics  . Alcohol use: No    Alcohol/week: 0.0 standard drinks    Comment: 12-25-2015 per pt no  . Drug use: No    Comment: 21-12-2015 per pt no      Allergies   Acetaminophen; Benadryl [diphenhydramine hcl]; Codeine; Dilantin [phenytoin sodium extended]; Melatonin; Tramadol; Ultram [tramadol hcl]; Vimpat [lacosamide]; Betadine [povidone iodine]; Latex; Penicillins; Sulfa antibiotics; Tape; and Vicodin [hydrocodone-acetaminophen]   Review of Systems Review of Systems  Constitutional: Negative for chills and fever.  HENT: Negative for ear pain and sore throat.   Eyes: Negative for pain and visual disturbance.  Respiratory: Negative for cough and shortness of breath.   Cardiovascular: Negative for chest pain and palpitations.  Gastrointestinal: Negative for abdominal pain, diarrhea and vomiting.  Genitourinary: Negative for dysuria and hematuria.  Musculoskeletal: Negative for arthralgias and back pain.  Skin: Negative for color change and rash.  Neurological: Positive for seizures. Negative for loss of consciousness, syncope, speech difficulty and headaches.  Psychiatric/Behavioral: Negative for confusion.  All  other systems reviewed and are negative.    Physical Exam Updated Vital Signs  ED Triage Vitals  Enc Vitals Group     BP 10/30/17 1701 135/89     Pulse Rate 10/30/17 1701 (!) 102     Resp 10/30/17 1701 16     Temp 10/30/17 1701 98.1 F (36.7 C)     Temp Source 10/30/17 1701 Oral     SpO2 10/30/17 1701 98 %     Weight 10/30/17 1701 165 lb (74.8 kg)     Height 10/30/17 1701 5' (1.524 m)     Head Circumference --      Peak Flow --      Pain Score 10/30/17 1706 5     Pain Loc --      Pain Edu? --      Excl. in [place]? --     Physical  Exam  Constitutional: She is oriented to person, place, and time. She appears well-developed and well-nourished. No distress.  HENT:  Head: Normocephalic and atraumatic.  Eyes: Pupils are equal, round, and reactive to light. Conjunctivae and EOM are normal.  Neck: Normal range of motion. Neck supple.  Cardiovascular: Normal rate, regular rhythm, normal heart sounds and intact distal pulses.  No murmur heard. Pulmonary/Chest: Effort normal and breath sounds normal. No respiratory distress.  Abdominal: Soft. There is no tenderness.  Musculoskeletal: Normal range of motion. She exhibits no edema.  Neurological: She is alert and oriented to person, place, and time. No cranial nerve deficit or sensory deficit. She exhibits normal muscle tone. Coordination normal.  Skin: Skin is warm and dry. Capillary refill takes less than 2 seconds.  Psychiatric: She has a normal mood and affect.  Nursing note and vitals reviewed.    ED Treatments / Results  Labs (all labs ordered are listed, but only abnormal results are displayed) Labs Reviewed - No data to display  EKG None  Radiology No results found.  Procedures Procedures (including critical care time)  Medications Ordered in ED Medications  alum & mag hydroxide-simeth (MAALOX/MYLANTA) 200-200-20 MG/5ML suspension 30 mL (has no administration in time range)  magnesium hydroxide (MILK OF MAGNESIA)  suspension 30 mL (has no administration in time range)  hydrOXYzine (ATARAX/VISTARIL) tablet 25 mg (25 mg Oral Given 10/31/17 2103)  pramipexole (MIRAPEX) tablet 0.5 mg (0.5 mg Oral Given 11/01/17 0825)  lamoTRIgine (LAMICTAL) tablet 150 mg (150 mg Oral Given 11/01/17 0826)  gabapentin (NEURONTIN) capsule 600 mg (600 mg Oral Given 11/01/17 1217)  levETIRAcetam (KEPPRA) tablet 1,000 mg (1,000 mg Oral Given 11/01/17 0959)  levETIRAcetam (KEPPRA) tablet 1,500 mg (1,500 mg Oral Given 10/31/17 2102)  mirtazapine (REMERON) tablet 30 mg (30 mg Oral Given 10/31/17 2102)  LORazepam (ATIVAN) tablet 0.5 mg (0.5 mg Oral Given 11/01/17 1217)     Initial Impression / Assessment and Plan / ED Course  I have reviewed the triage vital signs and the nursing notes.  Pertinent labs & imaging results that were available during my care of the patient were reviewed by me and considered in my medical decision making (see chart for details).     [name] is a 52 year old female history of depression currently at behavioral health for depression and suicidal ideation who presents to the ED after seizure-like activity.  Patient with normal vitals.  Had 2 seizure-like episodes today.  She is on Keppra.  She has been getting all of her medications at behavioral health.  However previous neurology notes show that patient has had normal EEGs in the past.  Patient likely with pseudoseizures does not have a primary seizure disorder.  She is alert and oriented on exam.  Neurologically she is intact.  Patient did not hit her head.  Staff with the patient states that she is at her baseline.  Medically cleared at this time and okay for transfer back to behavioral health.  No signs of any trauma at this time.  Normal vitals.  Reassuring exam.  Recommend return to ED if symptoms worsen.  This chart was dictated using voice recognition software.  Despite best efforts to proofread,  errors can occur which can change the  documentation meaning.  Final Clinical Impressions(s) / ED Diagnoses   Final diagnoses:  [place]    ED Discharge Orders    None       Lennice Sites, DO 11/01/17 1402

## 2017-11-01 NOTE — Progress Notes (Signed)
Patient back on unit. VS obtained- BP 123/85, HR 90,  O2 98%, RR 18 . Pt reports being very tired and requests to stay back from dinner to rest.

## 2017-11-01 NOTE — ED Notes (Signed)
Bed: WA06 Expected date:  Expected time:  Means of arrival:  Comments: Seizure from [facility]

## 2017-11-01 NOTE — Progress Notes (Signed)
[facility] MD Progress Note  11/01/2017 11:37 AM [name]  MRN:  [id number] Subjective: Patient reports she is feeling better, less depressed today.  Denies suicidal ideations at this time.  Does not endorse medication side effects. Objective : I have reviewed his chart notes and have met with patient. 52 year old female, widowed in July 2019. Reports history of depression. Reports worsening depression , neuro-vegetative symptoms of depression, passive SI. Medical history remarkable for Epilepsy. Patient reports she is feeling better and presents with a reactive affect.  Denies suicidal ideations.  Presents future oriented and expresses hope to be discharged soon. States she lives alone but has a good family support system and several family members live nearby. Currently tolerating medications well-on Remeron 30 mg nightly. She has a history of epilepsy for which she is on Lamictal, Keppra, Neurontin.  She has been continued on these, denies side effects.  Has had no seizures on unit. She has been been visible on unit, interacting with peers, no disruptive or agitated behaviors.    Principal Problem:  MDD Diagnosis:   Patient Active Problem List   Diagnosis Date Noted  . MDD (major depressive disorder), severe ([place]) [F32.2] 10/30/2017  . Widowed - July 2019 [Z63.4] 08/07/2017  . Sleep apnea [G47.30] 06/09/2017  . Colon polyp [K63.5] 06/05/2017  . Elevated liver function tests [R94.5] 06/05/2017  . Fatty liver [K76.0]   . Mild neurocognitive disorder [G31.84] 02/04/2017  . Primary insomnia [F51.01] 05/19/2016  . Bereavement [Z63.4] 12/25/2015  . Partial symptomatic epilepsy with complex partial seizures, not intractable, with status epilepticus ([place]) [G40.201] 07/24/2015  . Insomnia w/ sleep apnea [G47.00, G47.30] 07/24/2015  . OA (osteoarthritis) of knee [M17.10] 05/07/2015  . 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 Major depressive disorder, single episode [F32.9] 10/14/2010    Total Time spent with patient: 15 minutes  Past Psychiatric History:   Past Medical History:  Past Medical History:  Diagnosis Date  . Arthritis    "knees" (04/22/2012)  . Chronic bronchitis ([place])    "yearly; when the weather changes" (04/22/2012)  . Colon polyp   . Colon polyps    adenomatous and hyperplastic-  . Depression   . Eczema   . Epilepsy ([place])    "been having them right often here lately" (04/22/2012)  . Fatty liver   . High cholesterol   . History of kidney stones   . Osteoarthritis    [name] 04/22/2012  . Other convulsions 05/21/12   non-epileptic spells  . Restless leg   . Seizures ([place])   . Vertigo     Past Surgical History:  Procedure Laterality Date  . ABDOMINAL HYSTERECTOMY  2001  . BLADDER SUSPENSION    . BUNIONECTOMY Left 2000  . CESAREAN SECTION  1987; 1988  . COLONOSCOPY    . JOINT REPLACEMENT    . MASS EXCISION  10/22/2011   Procedure: EXCISION MASS;  Surgeon: [name], MD;  Location: [facility];  Service: General;  Laterality: Right;  excision right buttock mass  . TOTAL HIP ARTHROPLASTY Left 1993; 1995; 2000  . TOTAL KNEE ARTHROPLASTY Left 05/07/2015   Procedure: TOTAL LEFT KNEE ARTHROPLASTY;  Surgeon: [name], MD;  Location: [facility];  Service: Orthopedics;  Laterality: Left;  . TOTAL KNEE ARTHROPLASTY Right 10/01/2015   Procedure: RIGHT TOTAL KNEE ARTHROPLASTY;  Surgeon: [name], MD;  Location: [facility];  Service: Orthopedics;  Laterality: Right;   Family History:  Family History  Problem Relation Age of Onset  .  Cancer Mother   . Depression Father   . Cancer Brother   . Diabetes Brother   . Cancer Maternal Aunt   . Diabetes Maternal Aunt   . Bipolar disorder Son   . Drug abuse Son   . Heart attack Sister 34       during severe illness   Family Psychiatric  History:  Social History:  Social History   Substance and Sexual Activity  Alcohol Use No  . Alcohol/week: 0.0 standard drinks   Comment: 12-25-2015 per pt no      Social History   Substance and Sexual Activity  Drug Use No   Comment: 21-12-2015 per pt no     Social History   Socioeconomic History  . Marital status: Widowed    Spouse name: [name]  . Number of children: 2  . Years of education: 12 +  . Highest education level: Not on file  Occupational History  . Occupation: disability, medical  Social Needs  . Financial resource strain: Not very hard  . Food insecurity:    Worry: Never true    Inability: Never true  . Transportation needs:    Medical: No    Non-medical: No  Tobacco Use  . Smoking status: Never Smoker  . Smokeless tobacco: Never Used  Substance and Sexual Activity  . Alcohol use: No    Alcohol/week: 0.0 standard drinks    Comment: 12-25-2015 per pt no  . Drug use: No    Comment: 21-12-2015 per pt no   . Sexual activity: Not Currently    Birth control/protection: Surgical  Lifestyle  . Physical activity:    Days per week: 7 days    Minutes per session: 60 min  . Stress: Rather much  Relationships  . Social connections:    Talks on phone: More than three times a week    Gets together: More than three times a week    Attends religious service: More than 4 times per year    Active member of club or organization: No    Attends meetings of clubs or organizations: Not on file    Relationship status: Widowed  Other Topics Concern  . Not on file  Social History Narrative   Patient has two adult children.   Patient is disabled.   Patient has a high school education and some trade school.   Patient is right-handed.   Patient drinks four glasses of soda and tea daily.   Additional Social History:   Sleep: Improving  Appetite:  Good  Current Medications: Current Facility-Administered Medications  Medication Dose Route Frequency Provider Last Rate Last Dose  . alum & mag hydroxide-simeth (MAALOX/MYLANTA) 200-200-20 MG/5ML suspension 30 mL  30 mL Oral Q4H PRN Money, Darnelle Maffucci B, FNP      . gabapentin  (NEURONTIN) capsule 600 mg  600 mg Oral TID Money, Lowry Ram, FNP   600 mg at 11/01/17 7829  . hydrOXYzine (ATARAX/VISTARIL) tablet 25 mg  25 mg Oral TID PRN Money, Lowry Ram, FNP   25 mg at 10/31/17 2103  . lamoTRIgine (LAMICTAL) tablet 150 mg  150 mg Oral BID Money, Lowry Ram, FNP   150 mg at 11/01/17 0826  . levETIRAcetam (KEPPRA) tablet 1,000 mg  1,000 mg Oral q morning - 10a Cobos, Myer Peer, MD   1,000 mg at 11/01/17 0959  . levETIRAcetam (KEPPRA) tablet 1,500 mg  1,500 mg Oral QHS Cobos, Myer Peer, MD   1,500 mg at 10/31/17 2102  . LORazepam (  ATIVAN) tablet 0.5 mg  0.5 mg Oral TID Cobos, Myer Peer, MD   0.5 mg at 11/01/17 0826  . magnesium hydroxide (MILK OF MAGNESIA) suspension 30 mL  30 mL Oral Daily PRN Money, Darnelle Maffucci B, FNP      . mirtazapine (REMERON) tablet 30 mg  30 mg Oral QHS Cobos, Myer Peer, MD   30 mg at 10/31/17 2102  . pramipexole (MIRAPEX) tablet 0.5 mg  0.5 mg Oral BID Money, Lowry Ram, FNP   0.5 mg at 11/01/17 0825    Lab Results: No results found for this or any previous visit (from the past 48 hour(s)).  Blood Alcohol level:  Lab Results  Component Value Date   ETH <10 10/29/2017   ETH <10 23/55/7322    Metabolic Disorder Labs: Lab Results  Component Value Date   HGBA1C 5.4 11/28/2015   MPG 108 11/28/2015   No results found for: PROLACTIN Lab Results  Component Value Date   CHOL 151 09/21/2017   TRIG 124.0 09/21/2017   HDL 53.10 09/21/2017   CHOLHDL 3 09/21/2017   VLDL 24.8 09/21/2017   LDLCALC 73 09/21/2017   LDLCALC 86 07/07/2016    Physical Findings: AIMS: Facial and Oral Movements Muscles of Facial Expression: None, normal Lips and Perioral Area: None, normal Jaw: None, normal Tongue: None, normal,Extremity Movements Upper (arms, wrists, hands, fingers): None, normal Lower (legs, knees, ankles, toes): None, normal, Trunk Movements Neck, shoulders, hips: None, normal, Overall Severity Severity of abnormal movements (highest score from  questions above): None, normal Incapacitation due to abnormal movements: None, normal Patient's awareness of abnormal movements (rate only patient's report): No Awareness, Dental Status Current problems with teeth and/or dentures?: No Does patient usually wear dentures?: No  CIWA:    COWS:     Musculoskeletal: Strength & Muscle Tone: within normal limits Gait & Station: normal Patient leans: N/A  Psychiatric Specialty Exam: Physical Exam  ROS denies chest pain, no shortness of breath, no nausea, no vomiting  Blood pressure 117/89, pulse 88, temperature 98 F (36.7 C), temperature source Oral, resp. rate (!) 24, height 5' (1.524 m), weight 74.8 kg, SpO2 98 %.Body mass index is 32.22 kg/m.  General Appearance: Fairly Groomed  Eye Contact:  Good  Speech:  Normal Rate  Volume:  Normal  Mood:  Reports mood is "better"  Affect:  Appropriate, more reactive  Thought Process:  Linear and Descriptions of Associations: Intact  Orientation:  Other:  Fully alert and attentive  Thought Content:  No hallucinations, no delusions expressed  Suicidal Thoughts:  No-denies suicidal ideations, no self-injurious ideations  Homicidal Thoughts:  No-denies homicidal or violent ideations  Memory:  Recent and remote grossly intact  Judgement:  Fair  Insight:  Fair  Psychomotor Activity:  Normal  Concentration:  Concentration: Good and Attention Span: Good  Recall:  Good  Fund of Knowledge:  Good  Language:  Good  Akathisia:  Negative  Handed:  Right  AIMS (if indicated):     Assets:  Desire for Improvement Resilience Social Support  ADL's:  Intact  Cognition:  WNL  Sleep:  Number of Hours: 6.25   Assessment- 52 year old female, widowed in July 2019. Reports history of depression. Reports worsening depression , neuro-vegetative symptoms of depression, passive SI. Medical history remarkable for Epilepsy.  Patient is reporting feeling better than she did prior to admission, less depressed,  denies suicidal ideations, hopeful for discharge soon.  Denies SI . She currently denies medication side effects  Treatment  Plan Summary: Daily contact with patient to assess and evaluate symptoms and progress in treatment, Medication management, Plan Inpatient treatment and Medications as below Encourage group and milieu participation to work on coping skills and symptom reduction Continue antiseizure management with Keppra, Lamictal, Neurontin Continue Remeron 30 mg nightly for depression/sleep  Continue Ativan 0.5 mg 3 times daily for anxiety-(patient reports long-term management with Ativan between 1 and 2 mg a day, would consider further gradual taper.) Check hepatic function tests in a.m.(elevated AST and ALT on admission, Hep B and C serologies negative, no associated symptoms, reports was diagnosed with fatty liver)  Treatment team working on disposition planning options Jenne Campus, MD 11/01/2017, 11:37 AM

## 2017-11-01 NOTE — BHH Group Notes (Signed)
[facility] LCSW Group Therapy Note  Date/Time:  11/01/2017 9:00-10:00 or 10:00-11:00AM  Type of Therapy and Topic:  Group Therapy:  Healthy and Unhealthy Supports  Participation Level:  Active   Description of Group:  Patients in this group were introduced to the idea of adding a variety of healthy supports to address the various needs in their lives.Patients discussed what additional healthy supports could be helpful in their recovery and wellness after discharge in order to prevent future hospitalizations.   An emphasis was placed on using counselor, doctor, therapy groups, 12-step groups, and problem-specific support groups to expand supports.  They also worked as a group on developing a specific plan for several patients to deal with unhealthy supports through [place], psychoeducation with loved ones, and even termination of relationships.   Therapeutic Goals:   1)  discuss importance of adding supports to stay well once out of the hospital  2)  compare healthy versus unhealthy supports and identify some examples of each  3)  generate ideas and descriptions of healthy supports that can be added  4)  offer mutual support about how to address unhealthy supports  5)  encourage active participation in and adherence to discharge plan    Summary of Patient Progress:  The patient stated that current healthy supports in her life are family and her new dog "Luellen Pucker" while not identifying unhealthy suuports.  The patient expressed a willingness to add grief group as a support to help in her recovery journey.   Therapeutic Modalities:   Motivational Interviewing Brief Solution-Focused Therapy  Rolanda Jay

## 2017-11-01 NOTE — ED Triage Notes (Signed)
Transported by [facility] from behavioral health--2 witnessed seizures today, hx of seizures. Patient is prescribed Keppra and staff reports compliance. +post ictal and lethargy. Patient A & O x4.

## 2017-11-01 NOTE — Progress Notes (Addendum)
Pt observed by peers and staff having a seizure in the dining room at approximately 1250 - lasting less than a minute. It was noted by staff that patient did not hit her head upon falling. Pt able to respond to command 30 seconds postictal - BP 145/90, HR 82. EMS notified. Second seizure lasting  45 sec occurred  approx 15 min following first seizure. Pt responding to command 30 seconds postictal- Pt alert and oriented-left via EMS approximately 1320

## 2017-11-01 NOTE — Progress Notes (Signed)
Patient ID: [name], female   DOB: 05/07/1965, 52 y.o.   MRN: [id number]  Nursing Progress Note  Data: Patient presents pleasant and cooperative this evening. Patient denies concerns for Probation officer. Patient reports she is feeling better after having "an episode" of seizures today. Patient is seen interacting with peers in the milieu. Patient currently denies SI/HI/AVH. Patient reports she has been sleeping well with use of medications.   Action: Patient is educated about and provided medication per provider's orders. Patient safety maintained with q15 min safety checks and frequent rounding. High fall risk precautions in place. Emotional support given. 1:1 interaction and active listening provided. Labs, vital signs and patient behavior monitored throughout shift.   Response: Patient remains safe on the unit at this time and agrees to come to staff with any issues/concerns. Will continue to support and monitor.

## 2017-11-01 NOTE — BHH Suicide Risk Assessment (Addendum)
[place] INPATIENT:  Family/Significant Other Suicide Prevention Education  Suicide Prevention Education:  Education Completed; sister-in-law Tonie Griffith [phone] and youngest brother Jerrye Beavers has been identified by the patient as the family member/significant other with whom the patient will be residing, and identified as the person(s) who will aid the patient in the event of a mental health crisis (suicidal ideations/suicide attempt).  With written consent from the patient, the family member/significant other has been provided the following suicide prevention education, prior to the and/or following the discharge of the patient.  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  This impacts her ability to make decisions and to pay attention.  When her husband was alive, who also had brain trauma, they could help to remind each other.  Now she has not yet developed other means of coping.    Family reports that pt has a history and a habit of overtaking her medication.  Her mother takes care of these for her, and sister-in-law is planning to become involved in ensuring that only those needed for each dose are available to patient.  Entire family lives together in a compound very close to each other, so pt has a lot of support.  Family thinks she needs to stay for "awhile" in the hospital.  Family assures CSW they know about suicide prevention, but they were willing to listen to this information.    The suicide prevention education provided includes the following:  Suicide risk factors  Suicide prevention and interventions  National Suicide Hotline telephone number  [facility] assessment telephone number  [facility] Emergency Assistance [place] and/or Residential Mobile Crisis Unit telephone number  Request made of family/significant other to:  Remove weapons (e.g., guns, rifles, knives), all items previously/currently  identified as safety concern.    Remove drugs/medications (over-the-counter, prescriptions, illicit drugs), all items previously/currently identified as a safety concern.  The family member/significant other verbalizes understanding of the suicide prevention education information provided.  The family member/significant other agrees to remove the items of safety concern listed above.  Berlin Hun Grossman-Orr 11/01/2017, 5:31 PM

## 2017-11-01 NOTE — Progress Notes (Signed)
Patient's son Angelica Chessman notified of pt's transfer to [facility]. Jonathon's contact information: [phone]

## 2017-11-02 ENCOUNTER — Ambulatory Visit: Payer: Self-pay | Admitting: *Deleted

## 2017-11-02 LAB — HEPATIC FUNCTION PANEL
ALT: 106 U/L — ABNORMAL HIGH (ref 0–44)
AST: 66 U/L — ABNORMAL HIGH (ref 15–41)
Albumin: 3.9 g/dL (ref 3.5–5.0)
Alkaline Phosphatase: 66 U/L (ref 38–126)
Bilirubin, Direct: 0.1 mg/dL (ref 0.0–0.2)
Indirect Bilirubin: 0.2 mg/dL — ABNORMAL LOW (ref 0.3–0.9)
Total Bilirubin: 0.3 mg/dL (ref 0.3–1.2)
Total Protein: 7.1 g/dL (ref 6.5–8.1)

## 2017-11-02 MED ORDER — PNEUMOCOCCAL VAC POLYVALENT 25 MCG/0.5ML IJ INJ
0.5000 mL | INJECTION | Freq: Once | INTRAMUSCULAR | Status: AC
  Administered 2017-11-02: 0.5 mL via INTRAMUSCULAR

## 2017-11-02 NOTE — Progress Notes (Signed)
[facility] MD Progress Note  11/02/2017 3:57 PM [name]  MRN:  [id number] Subjective: Patient states she is feeling better today.  Currently minimizes depression and states "I am doing okay today".  Denies suicidal ideations and presents future oriented, hopeful for discharge soon. At this time denies medication side effects.  Objective : I have reviewed his chart notes and have met with patient. 52 year old female, widowed in July 2019. Reports history of depression. Reports worsening depression , neuro-vegetative symptoms of depression, passive SI. Medical history remarkable for Epilepsy.  Patient is endorsing improving mood and states she is feeling better today, at present minimizes depression or significant neurovegetative symptoms.  Denies suicidal ideations.  Presents future oriented and is hopeful for discharge soon, describes having a good family support system with several family members living next to her. Patient reports a long history of seizure disorder, and states she continues to have fairly frequent seizures in spite of taking her anti-seizure medications regularly.  She had a seizure like episode yesterday on unit, no head trauma-was transferred to the ED. ED notes reflect that prior neurological work-up has been done and EEGs have been normal, suggestive of pseudoseizures. She is on Lamictal, Neurontin, Keppra ( home medications) which she is tolerating well.  Denies side effects. Tolerating Remeron well. Today presents calm, pleasant on approach, reports she is feeling better, denies suicidal ideations Labs- AST, ALT remain elevated but have trended down.   Principal Problem:  MDD Diagnosis:   Patient Active Problem List   Diagnosis Date Noted  . MDD (major depressive disorder), severe ([place]) [F32.2] 10/30/2017  . Widowed - July 2019 [Z63.4] 08/07/2017  . Sleep apnea [G47.30] 06/09/2017  . Colon polyp [K63.5] 06/05/2017  . Elevated liver function tests [R94.5] 06/05/2017   . Fatty liver [K76.0]   . Mild neurocognitive disorder [G31.84] 02/04/2017  . Primary insomnia [F51.01] 05/19/2016  . Bereavement [Z63.4] 12/25/2015  . Partial symptomatic epilepsy with complex partial seizures, not intractable, with status epilepticus ([place]) [G40.201] 07/24/2015  . Insomnia w/ sleep apnea [G47.00, G47.30] 07/24/2015  . OA (osteoarthritis) of knee [M17.10] 05/07/2015  . 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 Major depressive disorder, single episode [F32.9] 10/14/2010   Total Time spent with patient: 20 minutes  Past Psychiatric History:   Past Medical History:  Past Medical History:  Diagnosis Date  . Arthritis    "knees" (04/22/2012)  . Chronic bronchitis ([place])    "yearly; when the weather changes" (04/22/2012)  . Colon polyp   . Colon polyps    adenomatous and hyperplastic-  . Depression   . Eczema   . Epilepsy ([place])    "been having them right often here lately" (04/22/2012)  . Fatty liver   . High cholesterol   . History of kidney stones   . Osteoarthritis    [name] 04/22/2012  . Other convulsions 05/21/12   non-epileptic spells  . Restless leg   . Seizures ([place])   . Vertigo     Past Surgical History:  Procedure Laterality Date  . ABDOMINAL HYSTERECTOMY  2001  . BLADDER SUSPENSION    . BUNIONECTOMY Left 2000  . CESAREAN SECTION  1987; 1988  . COLONOSCOPY    . JOINT REPLACEMENT    . MASS EXCISION  10/22/2011   Procedure: EXCISION MASS;  Surgeon: [name], MD;  Location: [facility];  Service: General;  Laterality: Right;  excision right buttock mass  . TOTAL HIP ARTHROPLASTY Left 1993; 1995; 2000  . TOTAL  KNEE ARTHROPLASTY Left 05/07/2015   Procedure: TOTAL LEFT KNEE ARTHROPLASTY;  Surgeon: [name], MD;  Location: [facility];  Service: Orthopedics;  Laterality: Left;  . TOTAL KNEE ARTHROPLASTY Right 10/01/2015   Procedure: RIGHT TOTAL KNEE ARTHROPLASTY;  Surgeon: [name], MD;  Location: [facility];  Service: Orthopedics;  Laterality: Right;    Family History:  Family History  Problem Relation Age of Onset  . Cancer Mother   . Depression Father   . Cancer Brother   . Diabetes Brother   . Cancer Maternal Aunt   . Diabetes Maternal Aunt   . Bipolar disorder Son   . Drug abuse Son   . Heart attack Sister 28       during severe illness   Family Psychiatric  History:  Social History:  Social History   Substance and Sexual Activity  Alcohol Use No  . Alcohol/week: 0.0 standard drinks   Comment: 12-25-2015 per pt no     Social History   Substance and Sexual Activity  Drug Use No   Comment: 21-12-2015 per pt no     Social History   Socioeconomic History  . Marital status: Widowed    Spouse name: [name]  . Number of children: 2  . Years of education: 12 +  . Highest education level: Not on file  Occupational History  . Occupation: disability, medical  Social Needs  . Financial resource strain: Not very hard  . Food insecurity:    Worry: Never true    Inability: Never true  . Transportation needs:    Medical: No    Non-medical: No  Tobacco Use  . Smoking status: Never Smoker  . Smokeless tobacco: Never Used  Substance and Sexual Activity  . Alcohol use: No    Alcohol/week: 0.0 standard drinks    Comment: 12-25-2015 per pt no  . Drug use: No    Comment: 21-12-2015 per pt no   . Sexual activity: Not Currently    Birth control/protection: Surgical  Lifestyle  . Physical activity:    Days per week: 7 days    Minutes per session: 60 min  . Stress: Rather much  Relationships  . Social connections:    Talks on phone: More than three times a week    Gets together: More than three times a week    Attends religious service: More than 4 times per year    Active member of club or organization: No    Attends meetings of clubs or organizations: Not on file    Relationship status: Widowed  Other Topics Concern  . Not on file  Social History Narrative   Patient has two adult children.   Patient is  disabled.   Patient has a high school education and some trade school.   Patient is right-handed.   Patient drinks four glasses of soda and tea daily.   Additional Social History:   Sleep: Improving  Appetite:  Good  Current Medications: Current Facility-Administered Medications  Medication Dose Route Frequency Provider Last Rate Last Dose  . alum & mag hydroxide-simeth (MAALOX/MYLANTA) 200-200-20 MG/5ML suspension 30 mL  30 mL Oral Q4H PRN Money, Darnelle Maffucci B, FNP      . gabapentin (NEURONTIN) capsule 600 mg  600 mg Oral TID Money, Lowry Ram, FNP   600 mg at 11/02/17 1119  . hydrOXYzine (ATARAX/VISTARIL) tablet 25 mg  25 mg Oral TID PRN Money, Lowry Ram, FNP   25 mg at 11/02/17 0945  . lamoTRIgine (LAMICTAL) tablet  150 mg  150 mg Oral BID Money, Darnelle Maffucci B, FNP   150 mg at 11/02/17 0800  . levETIRAcetam (KEPPRA) tablet 1,000 mg  1,000 mg Oral q morning - 10a Cobos, Myer Peer, MD   1,000 mg at 11/02/17 0943  . levETIRAcetam (KEPPRA) tablet 1,500 mg  1,500 mg Oral QHS Cobos, Myer Peer, MD   1,500 mg at 11/01/17 2105  . LORazepam (ATIVAN) tablet 0.5 mg  0.5 mg Oral TID Cobos, Myer Peer, MD   0.5 mg at 11/02/17 1118  . magnesium hydroxide (MILK OF MAGNESIA) suspension 30 mL  30 mL Oral Daily PRN Money, Darnelle Maffucci B, FNP      . mirtazapine (REMERON) tablet 30 mg  30 mg Oral QHS Cobos, Myer Peer, MD   30 mg at 11/01/17 2105  . pramipexole (MIRAPEX) tablet 0.5 mg  0.5 mg Oral BID Money, Lowry Ram, FNP   0.5 mg at 11/02/17 0802    Lab Results:  Results for orders placed or performed during the hospital encounter of 10/30/17 (from the past 48 hour(s))  Hepatic function panel     Status: Abnormal   Collection Time: 11/02/17  6:32 AM  Result Value Ref Range   Total Protein 7.1 6.5 - 8.1 g/dL   Albumin 3.9 3.5 - 5.0 g/dL   AST 66 (H) 15 - 41 U/L   ALT 106 (H) 0 - 44 U/L   Alkaline Phosphatase 66 38 - 126 U/L   Total Bilirubin 0.3 0.3 - 1.2 mg/dL   Bilirubin, Direct 0.1 0.0 - 0.2 mg/dL   Indirect  Bilirubin 0.2 (L) 0.3 - 0.9 mg/dL    Comment: Performed at [facility], [place] [address], [place], [place] [address]    Blood Alcohol level:  Lab Results  Component Value Date   ETH <10 10/29/2017   ETH <10 29/93/7169    Metabolic Disorder Labs: Lab Results  Component Value Date   HGBA1C 5.4 11/28/2015   MPG 108 11/28/2015   No results found for: PROLACTIN Lab Results  Component Value Date   CHOL 151 09/21/2017   TRIG 124.0 09/21/2017   HDL 53.10 09/21/2017   CHOLHDL 3 09/21/2017   VLDL 24.8 09/21/2017   LDLCALC 73 09/21/2017   LDLCALC 86 07/07/2016    Physical Findings: AIMS: Facial and Oral Movements Muscles of Facial Expression: None, normal Lips and Perioral Area: None, normal Jaw: None, normal Tongue: None, normal,Extremity Movements Upper (arms, wrists, hands, fingers): None, normal Lower (legs, knees, ankles, toes): None, normal, Trunk Movements Neck, shoulders, hips: None, normal, Overall Severity Severity of abnormal movements (highest score from questions above): None, normal Incapacitation due to abnormal movements: None, normal Patient's awareness of abnormal movements (rate only patient's report): No Awareness, Dental Status Current problems with teeth and/or dentures?: No Does patient usually wear dentures?: No  CIWA:  CIWA-Ar Total: 1 COWS:  COWS Total Score: 2  Musculoskeletal: Strength & Muscle Tone: within normal limits Gait & Station: normal Patient leans: N/A  Psychiatric Specialty Exam: Physical Exam  ROS denies chest pain, no shortness of breath, no nausea, no vomiting, denies any  seizure like activity or episode today  Blood pressure 122/84, pulse 92, temperature 98.1 F (36.7 C), temperature source Oral, resp. rate 18, height 5' (1.524 m), weight 74.8 kg, SpO2 97 %.Body mass index is 32.22 kg/m.  General Appearance: Improving grooming  Eye Contact:  Good  Speech:  Normal Rate  Volume:  Normal  Mood:  Reports she  is feeling  better, minimizes depression  Affect:  Appropriate  Thought Process:  Linear and Descriptions of Associations: Intact  Orientation:  Other:  Fully alert and attentive  Thought Content:  No hallucinations, no delusions expressed  Suicidal Thoughts:  No-denies suicidal ideations, no self-injurious ideations  Homicidal Thoughts:  No-denies homicidal or violent ideations  Memory:  Recent and remote grossly intact  Judgement:  Fair  Insight:  Fair  Psychomotor Activity:  Normal  Concentration:  Concentration: Good and Attention Span: Good  Recall:  Good  Fund of Knowledge:  Good  Language:  Good  Akathisia:  Negative  Handed:  Right  AIMS (if indicated):     Assets:  Desire for Improvement Resilience Social Support  ADL's:  Intact  Cognition:  WNL  Sleep:  Number of Hours: 6.75   Assessment- 52 year old female, widowed in July 2019. Reports history of depression. Reports worsening depression , neuro-vegetative symptoms of depression, passive SI.  Reports history of epilepsy.  Patient had seizure episode yesterday while in cafeteria.  Was referred to ED where she was medically cleared/returned to unit yesterday evening.  No seizure activity since then.  ED notes indicate prior history of negative EEGs and possible pseudoseizures.  She is on Keppra, Lamictal, Neurontin (home medications) which she is tolerating well.  Currently on Remeron which she is tolerating well, reports improved sleep.  At this time denies suicidal ideations and is future oriented, focusing on being discharged soon  Treatment Plan Summary: Daily contact with patient to assess and evaluate symptoms and progress in treatment, Medication management, Plan Inpatient treatment and Medications as below  Treatment plan reviewed as below today 10/21 Encourage group and milieu participation to work on coping skills and symptom reduction Continue antiseizure management with Keppra, Lamictal, Neurontin Continue  Remeron 30 mg nightly for depression/sleep  Continue Ativan 0.5 mg 3 times daily for anxiety-(patient reports long-term management with Ativan between 1 and 2 mg a day, would consider further gradual taper.  Due to possible history of epilepsy/seizure disorder would recommend slow/gradual taper) Treatment team working on disposition planning options Jenne Campus, MD 11/02/2017, 3:57 PM   Patient ID: [name], female   DOB: May 13, 1965, 52 y.o.   MRN: [id number]

## 2017-11-02 NOTE — Tx Team (Signed)
Interdisciplinary Treatment and Diagnostic Plan Update  11/02/2017 Time of Session:  [name] MRN: [id number]  Principal Diagnosis: <principal problem not specified>  Secondary Diagnoses: Active Problems:   MDD (major depressive disorder), severe ([facility])   Current Medications:  Current Facility-Administered Medications  Medication Dose Route Frequency Provider Last Rate Last Dose  . alum & mag hydroxide-simeth (MAALOX/MYLANTA) 200-200-20 MG/5ML suspension 30 mL  30 mL Oral Q4H PRN Money, Darnelle Maffucci B, FNP      . gabapentin (NEURONTIN) capsule 600 mg  600 mg Oral TID Money, Darnelle Maffucci B, FNP   600 mg at 11/02/17 0800  . hydrOXYzine (ATARAX/VISTARIL) tablet 25 mg  25 mg Oral TID PRN Money, Lowry Ram, FNP   25 mg at 10/31/17 2103  . lamoTRIgine (LAMICTAL) tablet 150 mg  150 mg Oral BID Money, Darnelle Maffucci B, FNP   150 mg at 11/02/17 0800  . levETIRAcetam (KEPPRA) tablet 1,000 mg  1,000 mg Oral q morning - 10a Cobos, Myer Peer, MD   1,000 mg at 11/01/17 0959  . levETIRAcetam (KEPPRA) tablet 1,500 mg  1,500 mg Oral QHS Cobos, Myer Peer, MD   1,500 mg at 11/01/17 2105  . LORazepam (ATIVAN) tablet 0.5 mg  0.5 mg Oral TID Cobos, Myer Peer, MD   0.5 mg at 11/02/17 0801  . magnesium hydroxide (MILK OF MAGNESIA) suspension 30 mL  30 mL Oral Daily PRN Money, Darnelle Maffucci B, FNP      . mirtazapine (REMERON) tablet 30 mg  30 mg Oral QHS Cobos, Myer Peer, MD   30 mg at 11/01/17 2105  . pramipexole (MIRAPEX) tablet 0.5 mg  0.5 mg Oral BID Money, Lowry Ram, FNP   0.5 mg at 11/02/17 0802   PTA Medications: Medications Prior to Admission  Medication Sig Dispense Refill Last Dose  . FLUoxetine HCl 60 MG TABS Take 60 mg by mouth daily. 90 tablet 0 Past Week at Unknown time  . gabapentin (NEURONTIN) 600 MG tablet Take 1 tablet (600 mg total) by mouth 3 (three) times daily. 270 tablet 3 10/29/2017 at Unknown time  . lamoTRIgine (LAMICTAL) 150 MG tablet Take 150 mg by mouth 2 (two) times daily.  5   . levETIRAcetam  (KEPPRA) 1000 MG tablet Take 1 tablet (1,000 mg total) by mouth 2 (two) times daily. Take 1 tablet PO in the morning and 1.5 tablets at bedtime 60 tablet 0 10/29/2017 at Unknown time  . loratadine (CLARITIN) 10 MG tablet Take 10 mg by mouth daily.  11   . LORazepam (ATIVAN) 1 MG tablet Take 1 tablet (1 mg total) by mouth 2 (two) times daily as needed for anxiety. 60 tablet 2 Past Week at Unknown time  . mirtazapine (REMERON) 15 MG tablet Take 0.5 tablets (7.5 mg total) by mouth at bedtime. 45 tablet 0 Past Week at Unknown time  . pramipexole (MIRAPEX) 0.5 MG tablet Take 0.5 mg by mouth 2 (two) times daily.  5 Past Week at Unknown time    Patient Stressors: Financial difficulties Health problems Loss of significant other.  Patient Strengths: Capable of independent living Communication skills Supportive family/friends  Treatment Modalities: Medication Management, Group therapy, Case management,  1 to 1 session with clinician, Psychoeducation, Recreational therapy.   Physician Treatment Plan for Primary Diagnosis: <principal problem not specified> Long Term Goal(s): Improvement in symptoms so as ready for discharge Improvement in symptoms so as ready for discharge   Short Term Goals: Ability to identify changes in lifestyle to reduce recurrence of condition will improve  Ability to maintain clinical measurements within normal limits will improve Ability to identify changes in lifestyle to reduce recurrence of condition will improve Ability to verbalize feelings will improve Ability to disclose and discuss suicidal ideas Ability to demonstrate self-control will improve Ability to identify and develop effective coping behaviors will improve Ability to maintain clinical measurements within normal limits will improve  Medication Management: Evaluate patient's response, side effects, and tolerance of medication regimen.  Therapeutic Interventions: 1 to 1 sessions, Unit Group sessions and  Medication administration.  Evaluation of Outcomes: Progressing  Physician Treatment Plan for Secondary Diagnosis: Active Problems:   MDD (major depressive disorder), severe ([place])  Long Term Goal(s): Improvement in symptoms so as ready for discharge Improvement in symptoms so as ready for discharge   Short Term Goals: Ability to identify changes in lifestyle to reduce recurrence of condition will improve Ability to maintain clinical measurements within normal limits will improve Ability to identify changes in lifestyle to reduce recurrence of condition will improve Ability to verbalize feelings will improve Ability to disclose and discuss suicidal ideas Ability to demonstrate self-control will improve Ability to identify and develop effective coping behaviors will improve Ability to maintain clinical measurements within normal limits will improve     Medication Management: Evaluate patient's response, side effects, and tolerance of medication regimen.  Therapeutic Interventions: 1 to 1 sessions, Unit Group sessions and Medication administration.  Evaluation of Outcomes: Progressing   RN Treatment Plan for Primary Diagnosis: <principal problem not specified> Long Term Goal(s): Knowledge of disease and therapeutic regimen to maintain health will improve  Short Term Goals: Ability to participate in decision making will improve, Ability to verbalize feelings will improve, Ability to disclose and discuss suicidal ideas and Ability to identify and develop effective coping behaviors will improve  Medication Management: RN will administer medications as ordered by provider, will assess and evaluate patient's response and provide education to patient for prescribed medication. RN will report any adverse and/or side effects to prescribing provider.  Therapeutic Interventions: 1 on 1 counseling sessions, Psychoeducation, Medication administration, Evaluate responses to treatment, Monitor vital  signs and CBGs as ordered, Perform/monitor CIWA, COWS, AIMS and Fall Risk screenings as ordered, Perform wound care treatments as ordered.  Evaluation of Outcomes: Progressing   LCSW Treatment Plan for Primary Diagnosis: <principal problem not specified> Long Term Goal(s): Safe transition to appropriate next level of care at discharge, Engage patient in therapeutic group addressing interpersonal concerns.  Short Term Goals: Engage patient in aftercare planning with referrals and resources  Therapeutic Interventions: Assess for all discharge needs, 1 to 1 time with Social worker, Explore available resources and support systems, Assess for adequacy in community support network, Educate family and significant other(s) on suicide prevention, Complete Psychosocial Assessment, Interpersonal group therapy.  Evaluation of Outcomes: Progressing   Progress in Treatment: Attending groups: Yes. Participating in groups: Yes. Taking medication as prescribed: Yes. Toleration medication: Yes. Family/Significant other contact made: Yes, individual(s) contacted:  the patient's sister in law Patient understands diagnosis: Yes. Discussing patient identified problems/goals with staff: Yes. Medical problems stabilized or resolved: Yes. Denies suicidal/homicidal ideation: Yes. Issues/concerns per patient self-inventory: No. Other:  New problem(s) identified:None   New Short Term/Long Term Goal(s): medication stabilization, elimination of SI thoughts, development of comprehensive mental wellness plan.   Patient Goals:    Discharge Plan or Barriers: CSW will assess for appropriate referrals and discharge planning.   Reason for Continuation of Hospitalization: Depression Medication stabilization  Estimated Length of Stay:3-5 days  Attendees: Patient: 11/02/2017 8:38 AM  Physician: Dr. Neita Garnet, MD 11/02/2017 8:38 AM  Nursing: Rise Paganini.[facility] 11/02/2017 8:38 AM  RN Care Manager: Lars Pinks,  RN 11/02/2017 8:38 AM  Social Worker: Radonna Ricker, [place] 11/02/2017 8:38 AM  Recreational Therapist: Rhunette Croft 11/02/2017 8:38 AM  Other:  11/02/2017 8:38 AM  Other:  11/02/2017 8:38 AM  Other: 11/02/2017 8:38 AM    Scribe for Treatment Team: Marylee Floras, [place] 11/02/2017 8:38 AM

## 2017-11-02 NOTE — Progress Notes (Signed)
Patient ID: [name], female   DOB: 12-31-1965, 52 y.o.   MRN: [id number] D: Assumed care patient @ 2330. Patient in bed sleeping. Respiration regular and unlabored. No sign of distress noted at this time A: 15 mins checks for safety. R: Patient remains safe.

## 2017-11-02 NOTE — Progress Notes (Signed)
Patient ID: [name], female   DOB: January 25, 1965, 52 y.o.   MRN: [id number]   D: Patient went to group tonight. Interacting well with other peers on the unit. Pleasant and smiling when speaking. Reports her mood has been good tonight and denies any active SI. Contracts on unit.  A: Staff will monitor on q 15 minute checks, follow treatment plan, and give medications as ordered. R: Cooperative on the unit. Taking Keppra 1500mg  as ordered tonight. No s/s of seizures.

## 2017-11-02 NOTE — Progress Notes (Signed)
Adult Psychoeducational Group Note  Date:  11/02/2017 Time:  10:19 PM  Group Topic/Focus:  Wrap-Up Group:   The focus of this group is to help patients review their daily goal of treatment and discuss progress on daily workbooks.  Participation Level:  Active  Participation Quality:  Appropriate  Affect:  Appropriate  Cognitive:  Appropriate  Insight: Appropriate  Engagement in Group:  Engaged  Modes of Intervention:  Discussion  Additional Comments:  Pt's goal was to laugh and she met her goal.  Pt stated she hasn't laughed or had this good of a day in almost 4 or 5 months.  Pt rated the day at a 10/10.  [name] 11/02/2017, 10:19 PM

## 2017-11-02 NOTE — Plan of Care (Signed)
Nurse discussed anxiety, depression, coping skills with patient. 

## 2017-11-02 NOTE — Progress Notes (Signed)
Adult Psychoeducational Group Note  Date:  11/02/2017 Time:  8:08 AM  Group Topic/Focus:  Wrap-Up Group:   The focus of this group is to help patients review their daily goal of treatment and discuss progress on daily workbooks.  Participation Level:  Active  Participation Quality:  Appropriate  Affect:  Appropriate  Cognitive:  Appropriate  Insight: Appropriate and Good  Engagement in Group:  Engaged  Modes of Intervention:  Discussion  Additional Comments:  Pt attend wrap up group. Her day was a 3. She had to go to hospital due to sick.  Lenice Llamas Long 11/02/2017, 8:08 AM

## 2017-11-02 NOTE — Progress Notes (Signed)
D:  Patient's self inventory sheet, patient sleeps good.  Patient has good appetite, normal energy level, good concentration.  Denied depression and anxiety, hopeless 1.  Denied withdrawals.  Denied SI.  Denied physical problems.  Denied physical pain.  Goal is meeting goals.  Plans to continue to participate.   A:  Medications administered per MD orders.  Emotional support and encouragement given patient. R:  Denied SI and HI, contracts for safety.  Denied A/V hallucinations.  Safety maintained with 15 minutes.

## 2017-11-02 NOTE — [facility] Group Notes (Signed)
LCSW Group Therapy Note 11/02/2017 1:58 PM  Type of Therapy and Topic: Group Therapy: Overcoming Obstacles  Participation Level: Active  Description of Group:  In this group patients will be encouraged to explore what they see as obstacles to their own wellness and recovery. They will be guided to discuss their thoughts, feelings, and behaviors related to these obstacles. The group will process together ways to cope with barriers, with attention given to specific choices patients can make. Each patient will be challenged to identify changes they are motivated to make in order to overcome their obstacles. This group will be process-oriented, with patients participating in exploration of their own experiences as well as giving and receiving support and challenge from other group members.  Therapeutic Goals: 1. Patient will identify personal and current obstacles as they relate to admission. 2. Patient will identify barriers that currently interfere with their wellness or overcoming obstacles.  3. Patient will identify feelings, thought process and behaviors related to these barriers. 4. Patient will identify two changes they are willing to make to overcome these obstacles:   Summary of Patient Progress  [name] was engaged and participated throughout the group session. [name] reports that her main obstacles are grieving her husband's death, not being able to manage her stress and struggling with her epilepsy diagnosis. [name] reports that she plans on "cleaning house" and changing her environment to overcome these obstacles.     Therapeutic Modalities:  Cognitive Behavioral Therapy Solution Focused Therapy Motivational Interviewing Relapse Prevention Therapy   Theresa Duty Clinical Social Worker

## 2017-11-02 NOTE — Progress Notes (Signed)
Recreation Therapy Notes  Date: 10.21.19 Time: 0930 Location: 300 [address] Dayroom  Group Topic: Stress Management  Goal Area(s) Addresses:  Patient will verbalize importance of using healthy stress management.  Patient will identify positive emotions associated with healthy stress management.   Intervention: Stress Management  Activity : Meditation.  LRT introduced the stress management technique of meditation.  LRT played a meditation that dealt with impermanence.  Patients were to follow along as the meditation played to engage in the meditation.  Education:  Stress Management, Discharge Planning.   Education Outcome: Acknowledges edcuation/In group clarification offered/Needs additional education  Clinical Observations/Feedback: Pt did not attend group.    Victorino Sparrow, LRT/CTRS         Victorino Sparrow A 11/02/2017 11:27 AM

## 2017-11-02 NOTE — [facility] Group Notes (Signed)
[facility] Group Notes:  (Nursing/MHT/Case Management/Adjunct)  Date:  11/02/2017  Time:  4:15 pm  Type of Therapy:  Psychoeducational Skills  Participation Level:  Active  Participation Quality:  Appropriate  Affect:  Appropriate  Cognitive:  Appropriate  Insight:  Appropriate  Engagement in Group:  Engaged  Modes of Intervention:  Education  Summary of Progress/Problems: Patient attended group, listened and responded appropriately.     Cammy Copa 11/02/2017, 6:20 PM

## 2017-11-03 DIAGNOSIS — F445 Conversion disorder with seizures or convulsions: Secondary | ICD-10-CM

## 2017-11-03 LAB — HEPATIC FUNCTION PANEL
ALT: 80 U/L — ABNORMAL HIGH (ref 0–44)
AST: 51 U/L — ABNORMAL HIGH (ref 15–41)
Albumin: 4.3 g/dL (ref 3.5–5.0)
Alkaline Phosphatase: 67 U/L (ref 38–126)
Bilirubin, Direct: 0.1 mg/dL (ref 0.0–0.2)
Indirect Bilirubin: 0.3 mg/dL (ref 0.3–0.9)
Total Bilirubin: 0.4 mg/dL (ref 0.3–1.2)
Total Protein: 7.5 g/dL (ref 6.5–8.1)

## 2017-11-03 NOTE — Progress Notes (Signed)
D: Patient has been somatic and med seeking today.  She requested vistaril with her lorazepam.  I informed her that I could give one or the other, but not both.  She requested the ativan and when she was informed it was 0.5, she states, "it should be 1 mg."  Patient took the ativan and came back an hour later and complained of some itching on her chest.  After examination, it looked as if she may have scratched her skin.  It did not appear to be a rash. Patient was given her vistaril.  She has not complained since.  She denies any depressive symptoms or thoughts of self harm.  She is observed in the day room interacting with her peers.    A: Continue to monitor medication management and MD orders.  Safety checks completed every 15 minutes per protocol.  Offer support and encouragement as needed.  R: Patient is receptive to staff; her behavior is appropriate.

## 2017-11-03 NOTE — Progress Notes (Signed)
[facility] MD Progress Note  11/03/2017 1:06 PM [name]  MRN:  [id number] Subjective:  [name] is an 52 y.o. female. Pt reports SI. Pt attempted to overdose on her prescription medication. Pt reports a previous SI attempt. Pt reports 1 previous inpatient admission. Pt denies HI and AVH. Pt states her husband passed away in August 27, 2017 and she's having a difficult time processing his death. The Pt states that she was looking at her deceased husband's pictures today and became extremely depressed. Per Pt she contacted the [facility] and informed them that she was depressed and suicidal. Pt denies current mental health treatment. Pt denies current mental health medication. The [facility] department transported her to [place]. The Pt denies SA.   Objective: Patient is seen and examined.  Patient is a 52 year old female with a past psychiatric history significant for major depression and generalized anxiety disorder.  She is seen in follow-up.  He stated she is doing better.  She stated she is tolerating her medications.  She stated her mood was improving.  She was able to smile and engage today.  She stated she was enjoying the groups.  She was learning coping skills.  She denied any suicidal ideation.  She denied any side effects to current medications.  She was previously noted to have nonepileptic seizures.  She is on Keppra for that.  Vital signs are stable, she is afebrile.  She slept 6.5 hours last night.  Review of her laboratories revealed mildly elevated AST and ALT (66 and 106 respectively).  Principal Problem: <principal problem not specified> Diagnosis:   Patient Active Problem List   Diagnosis Date Noted  . MDD (major depressive disorder), severe ([place]) [F32.2] 10/30/2017  . Widowed - 08-27-17 [Z63.4] 08/07/2017  . Sleep apnea [G47.30] 06/09/2017  . Colon polyp [K63.5] 06/05/2017  . Elevated liver function tests [R94.5] 06/05/2017  . Fatty liver [K76.0]   . Mild neurocognitive  disorder [G31.84] 02/04/2017  . Primary insomnia [F51.01] 05/19/2016  . Bereavement [Z63.4] 12/25/2015  . Partial symptomatic epilepsy with complex partial seizures, not intractable, with status epilepticus ([place]) [G40.201] 07/24/2015  . Insomnia w/ sleep apnea [G47.00, G47.30] 07/24/2015  . OA (osteoarthritis) of knee [M17.10] 05/07/2015  . 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 Major depressive disorder, single episode [F32.9] 10/14/2010   Total Time spent with patient: 15 minutes  Past Psychiatric History: See admission H&P  Past Medical History:  Past Medical History:  Diagnosis Date  . Arthritis    "knees" (04/22/2012)  . Chronic bronchitis ([place])    "yearly; when the weather changes" (04/22/2012)  . Colon polyp   . Colon polyps    adenomatous and hyperplastic-  . Depression   . Eczema   . Epilepsy ([place])    "been having them right often here lately" (04/22/2012)  . Fatty liver   . High cholesterol   . History of kidney stones   . Osteoarthritis    [name] 04/22/2012  . Other convulsions 05/21/12   non-epileptic spells  . Restless leg   . Seizures ([place])   . Vertigo     Past Surgical History:  Procedure Laterality Date  . ABDOMINAL HYSTERECTOMY  2001  . BLADDER SUSPENSION    . BUNIONECTOMY Left 2000  . CESAREAN SECTION  1987; 1988  . COLONOSCOPY    . JOINT REPLACEMENT    . MASS EXCISION  10/22/2011   Procedure: EXCISION MASS;  Surgeon: [name], MD;  Location: [facility];  Service:  General;  Laterality: Right;  excision right buttock mass  . TOTAL HIP ARTHROPLASTY Left 1993; 1995; 2000  . TOTAL KNEE ARTHROPLASTY Left 05/07/2015   Procedure: TOTAL LEFT KNEE ARTHROPLASTY;  Surgeon: [name], MD;  Location: [facility];  Service: Orthopedics;  Laterality: Left;  . TOTAL KNEE ARTHROPLASTY Right 10/01/2015   Procedure: RIGHT TOTAL KNEE ARTHROPLASTY;  Surgeon: [name], MD;  Location: [facility];  Service: Orthopedics;  Laterality: Right;   Family History:  Family  History  Problem Relation Age of Onset  . Cancer Mother   . Depression Father   . Cancer Brother   . Diabetes Brother   . Cancer Maternal Aunt   . Diabetes Maternal Aunt   . Bipolar disorder Son   . Drug abuse Son   . Heart attack Sister 4       during severe illness   Family Psychiatric  History: See admission H&P Social History:  Social History   Substance and Sexual Activity  Alcohol Use No  . Alcohol/week: 0.0 standard drinks   Comment: 12-25-2015 per pt no     Social History   Substance and Sexual Activity  Drug Use No   Comment: 21-12-2015 per pt no     Social History   Socioeconomic History  . Marital status: Widowed    Spouse name: [name]  . Number of children: 2  . Years of education: 12 +  . Highest education level: Not on file  Occupational History  . Occupation: disability, medical  Social Needs  . Financial resource strain: Not very hard  . Food insecurity:    Worry: Never true    Inability: Never true  . Transportation needs:    Medical: No    Non-medical: No  Tobacco Use  . Smoking status: Never Smoker  . Smokeless tobacco: Never Used  Substance and Sexual Activity  . Alcohol use: No    Alcohol/week: 0.0 standard drinks    Comment: 12-25-2015 per pt no  . Drug use: No    Comment: 21-12-2015 per pt no   . Sexual activity: Not Currently    Birth control/protection: Surgical  Lifestyle  . Physical activity:    Days per week: 7 days    Minutes per session: 60 min  . Stress: Rather much  Relationships  . Social connections:    Talks on phone: More than three times a week    Gets together: More than three times a week    Attends religious service: More than 4 times per year    Active member of club or organization: No    Attends meetings of clubs or organizations: Not on file    Relationship status: Widowed  Other Topics Concern  . Not on file  Social History Narrative   Patient has two adult children.   Patient is disabled.    Patient has a high school education and some trade school.   Patient is right-handed.   Patient drinks four glasses of soda and tea daily.   Additional Social History:                         Sleep: Fair  Appetite:  Good  Current Medications: Current Facility-Administered Medications  Medication Dose Route Frequency Provider Last Rate Last Dose  . alum & mag hydroxide-simeth (MAALOX/MYLANTA) 200-200-20 MG/5ML suspension 30 mL  30 mL Oral Q4H PRN Money, Darnelle Maffucci B, FNP      . gabapentin (NEURONTIN) capsule 600 mg  600 mg Oral TID Money, Lowry Ram, FNP   600 mg at 11/03/17 1113  . hydrOXYzine (ATARAX/VISTARIL) tablet 25 mg  25 mg Oral TID PRN Money, Lowry Ram, FNP   25 mg at 11/03/17 1259  . lamoTRIgine (LAMICTAL) tablet 150 mg  150 mg Oral BID Money, Lowry Ram, FNP   150 mg at 11/03/17 0743  . levETIRAcetam (KEPPRA) tablet 1,000 mg  1,000 mg Oral q morning - 10a Cobos, Myer Peer, MD   1,000 mg at 11/03/17 0953  . levETIRAcetam (KEPPRA) tablet 1,500 mg  1,500 mg Oral QHS Cobos, Myer Peer, MD   1,500 mg at 11/02/17 2113  . LORazepam (ATIVAN) tablet 0.5 mg  0.5 mg Oral TID Cobos, Myer Peer, MD   0.5 mg at 11/03/17 1113  . magnesium hydroxide (MILK OF MAGNESIA) suspension 30 mL  30 mL Oral Daily PRN Money, Darnelle Maffucci B, FNP      . mirtazapine (REMERON) tablet 30 mg  30 mg Oral QHS Cobos, Myer Peer, MD   30 mg at 11/02/17 2113  . pramipexole (MIRAPEX) tablet 0.5 mg  0.5 mg Oral BID Money, Lowry Ram, FNP   0.5 mg at 11/03/17 9326    Lab Results:  Results for orders placed or performed during the hospital encounter of 10/30/17 (from the past 48 hour(s))  Hepatic function panel     Status: Abnormal   Collection Time: 11/02/17  6:32 AM  Result Value Ref Range   Total Protein 7.1 6.5 - 8.1 g/dL   Albumin 3.9 3.5 - 5.0 g/dL   AST 66 (H) 15 - 41 U/L   ALT 106 (H) 0 - 44 U/L   Alkaline Phosphatase 66 38 - 126 U/L   Total Bilirubin 0.3 0.3 - 1.2 mg/dL   Bilirubin, Direct 0.1 0.0 - 0.2 mg/dL    Indirect Bilirubin 0.2 (L) 0.3 - 0.9 mg/dL    Comment: Performed at [facility], [place] [address] [place], [place] [address]    Blood Alcohol level:  Lab Results  Component Value Date   ETH <10 10/29/2017   ETH <10 80/99/8338    Metabolic Disorder Labs: Lab Results  Component Value Date   HGBA1C 5.4 11/28/2015   MPG 108 11/28/2015   No results found for: PROLACTIN Lab Results  Component Value Date   CHOL 151 09/21/2017   TRIG 124.0 09/21/2017   HDL 53.10 09/21/2017   CHOLHDL 3 09/21/2017   VLDL 24.8 09/21/2017   LDLCALC 73 09/21/2017   LDLCALC 86 07/07/2016    Physical Findings: AIMS: Facial and Oral Movements Muscles of Facial Expression: None, normal Lips and Perioral Area: None, normal Jaw: None, normal Tongue: None, normal,Extremity Movements Upper (arms, wrists, hands, fingers): None, normal Lower (legs, knees, ankles, toes): None, normal, Trunk Movements Neck, shoulders, hips: None, normal, Overall Severity Severity of abnormal movements (highest score from questions above): None, normal Incapacitation due to abnormal movements: None, normal Patient's awareness of abnormal movements (rate only patient's report): No Awareness, Dental Status Current problems with teeth and/or dentures?: No Does patient usually wear dentures?: No  CIWA:  CIWA-Ar Total: 1 COWS:  COWS Total Score: 2  Musculoskeletal: Strength & Muscle Tone: within normal limits Gait & Station: normal Patient leans: N/A  Psychiatric Specialty Exam: Physical Exam  Nursing note and vitals reviewed. Constitutional: She is oriented to person, place, and time. She appears well-developed and well-nourished.  HENT:  Head: Normocephalic.  Respiratory: Effort normal.  Neurological: She is alert and oriented to  person, place, and time.    ROS  Blood pressure (!) 112/97, pulse (!) 101, temperature 98.1 F (36.7 C), temperature source Oral, resp. rate 16, height 5' (1.524 m),  weight 74.8 kg, SpO2 97 %.Body mass index is 32.22 kg/m.  General Appearance: Casual  Eye Contact:  Good  Speech:  Normal Rate  Volume:  Normal  Mood:  Euthymic  Affect:  Congruent  Thought Process:  Coherent and Descriptions of Associations: Intact  Orientation:  Full (Time, Place, and Person)  Thought Content:  Logical  Suicidal Thoughts:  No  Homicidal Thoughts:  No  Memory:  Immediate;   Fair Recent;   Fair Remote;   Fair  Judgement:  Intact  Insight:  Fair  Psychomotor Activity:  Normal  Concentration:  Concentration: Fair and Attention Span: Fair  Recall:  [employer] of Knowledge:  Fair  Language:  Fair  Akathisia:  Negative  Handed:  Right  AIMS (if indicated):     Assets:  Communication Skills Desire for Improvement Financial Resources/Insurance Housing Physical Health Resilience Social Support  ADL's:  Intact  Cognition:  WNL  Sleep:  Number of Hours: 6.5     Treatment Plan Summary: Daily contact with patient to assess and evaluate symptoms and progress in treatment, Medication management and Plan : Patient is seen and examined.  Patient is a 52 year old female with the above-stated past psychiatric history seen in follow-up.  #1 major depression, recurrent-continue Remeron 30 mg p.o. nightly for depression and sleep, continue Ativan 0.5 mg p.o. 3 times daily for anxiety.  #2 nonepileptic seizures-continue antiseizure medications with Keppra, Lamictal and Neurontin.  #3 disposition planning-probable discharge in a.m. tomorrow.  Sharma Covert, MD 11/03/2017, 1:06 PM

## 2017-11-03 NOTE — [facility] Group Notes (Signed)
LCSW Group Therapy Note 11/03/2017 12:11 PM  Type of Therapy/Topic: Group Therapy: Feelings about Diagnosis  Participation Level: Active   Description of Group:  This group will allow patients to explore their thoughts and feelings about diagnoses they have received. Patients will be guided to explore their level of understanding and acceptance of these diagnoses. Facilitator will encourage patients to process their thoughts and feelings about the reactions of others to their diagnosis and will guide patients in identifying ways to discuss their diagnosis with significant others in their lives. This group will be process-oriented, with patients participating in exploration of their own experiences, giving and receiving support, and processing challenge from other group members.  Therapeutic Goals: 1. Patient will demonstrate understanding of diagnosis as evidenced by identifying two or more symptoms of the disorder 2. Patient will be able to express two feelings regarding the diagnosis 3. Patient will demonstrate their ability to communicate their needs through discussion and/or role play  Summary of Patient Progress:  [name] was engaged and participated throughout the group session. [name] reports that initially, she did not care that she had a mental health diagnosis, however since the birth of grandchildren she wants to be here for them.     Therapeutic Modalities:  Cognitive Behavioral Therapy Brief Therapy Feelings Identification    [place] Clinical Social Worker

## 2017-11-04 MED ORDER — MIRTAZAPINE 30 MG PO TABS: 30.0000 mg | ORAL_TABLET | Freq: Every day | ORAL | 0 refills | Status: DC

## 2017-11-04 MED ORDER — LEVETIRACETAM 750 MG PO TABS: 1500.0000 mg | ORAL_TABLET | Freq: Every day | ORAL | 0 refills | Status: DC

## 2017-11-04 MED ORDER — LAMOTRIGINE 150 MG PO TABS: 150.0000 mg | ORAL_TABLET | Freq: Two times a day (BID) | ORAL | 0 refills | Status: DC

## 2017-11-04 MED ORDER — LEVETIRACETAM 1000 MG PO TABS: 1000.0000 mg | ORAL_TABLET | Freq: Every morning | ORAL | 0 refills | Status: DC

## 2017-11-04 MED ORDER — PRAMIPEXOLE DIHYDROCHLORIDE 0.5 MG PO TABS: 0.5000 mg | ORAL_TABLET | Freq: Two times a day (BID) | ORAL | 0 refills | Status: DC

## 2017-11-04 MED ORDER — LORAZEPAM 0.5 MG PO TABS: 0.5000 mg | ORAL_TABLET | Freq: Three times a day (TID) | ORAL | 0 refills | Status: DC

## 2017-11-04 MED ORDER — HYDROXYZINE HCL 25 MG PO TABS: 25.0000 mg | ORAL_TABLET | Freq: Three times a day (TID) | ORAL | 0 refills | Status: DC | PRN

## 2017-11-04 NOTE — Discharge Summary (Signed)
Physician Discharge Summary Note  Patient:  [name] is an 52 y.o., female MRN:  [id number] DOB:  18-Feb-1965 Patient phone:  [phone] (home)  Patient address:   [address] [place] [address],  Total Time spent with patient: 20 minutes  Date of Admission:  10/30/2017 Date of Discharge: 11/04/17  Reason for Admission:  Worsening depression with SI  Principal Problem: MDD (major depressive disorder), severe [facility]) Discharge Diagnoses: Patient Active Problem List   Diagnosis Date Noted  . MDD (major depressive disorder), severe ([place]) [F32.2] 10/30/2017  . Widowed - July 2019 [Z63.4] 08/07/2017  . Sleep apnea [G47.30] 06/09/2017  . Colon polyp [K63.5] 06/05/2017  . Elevated liver function tests [R94.5] 06/05/2017  . Fatty liver [K76.0]   . Mild neurocognitive disorder [G31.84] 02/04/2017  . Primary insomnia [F51.01] 05/19/2016  . Bereavement [Z63.4] 12/25/2015  . Partial symptomatic epilepsy with complex partial seizures, not intractable, with status epilepticus ([place]) [G40.201] 07/24/2015  . Insomnia w/ sleep apnea [G47.00, G47.30] 07/24/2015  . OA (osteoarthritis) of knee [M17.10] 05/07/2015  . 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 Major depressive disorder, single episode [F32.9] 10/14/2010    Past Psychiatric History: reports one prior psychiatric admissions 20 years ago, for depression and anxiety associated to " dealing with a difficult teenager who was on drugs ". States she has never attempted suicide, denies history of self cutting or self injurious behaviors, denies history of violence, denies history of PTSD, denies any history of mania or hypomania.  Past Medical History:  Past Medical History:  Diagnosis Date  . Arthritis    "knees" (04/22/2012)  . Chronic bronchitis ([place])    "yearly; when the weather changes" (04/22/2012)  . Colon polyp   . Colon polyps    adenomatous and hyperplastic-  . Depression   . Eczema   . Epilepsy ([place])    "been having  them right often here lately" (04/22/2012)  . Fatty liver   . High cholesterol   . History of kidney stones   . Osteoarthritis    [name] 04/22/2012  . Other convulsions 05/21/12   non-epileptic spells  . Restless leg   . Seizures ([place])   . Vertigo     Past Surgical History:  Procedure Laterality Date  . ABDOMINAL HYSTERECTOMY  2001  . BLADDER SUSPENSION    . BUNIONECTOMY Left 2000  . CESAREAN SECTION  1987; 1988  . COLONOSCOPY    . JOINT REPLACEMENT    . MASS EXCISION  10/22/2011   Procedure: EXCISION MASS;  Surgeon: [name], MD;  Location: [facility];  Service: General;  Laterality: Right;  excision right buttock mass  . TOTAL HIP ARTHROPLASTY Left 1993; 1995; 2000  . TOTAL KNEE ARTHROPLASTY Left 05/07/2015   Procedure: TOTAL LEFT KNEE ARTHROPLASTY;  Surgeon: [name], MD;  Location: [facility];  Service: Orthopedics;  Laterality: Left;  . TOTAL KNEE ARTHROPLASTY Right 10/01/2015   Procedure: RIGHT TOTAL KNEE ARTHROPLASTY;  Surgeon: [name], MD;  Location: [facility];  Service: Orthopedics;  Laterality: Right;   Family History:  Family History  Problem Relation Age of Onset  . Cancer Mother   . Depression Father   . Cancer Brother   . Diabetes Brother   . Cancer Maternal Aunt   . Diabetes Maternal Aunt   . Bipolar disorder Son   . Drug abuse Son   . Heart attack Sister 56       during severe illness   Family Psychiatric  History: reports her  father had history of depression and alcohol abuse . Reports both her sons have history of depression, no suicides in family. States there is a history of alcohol abuse in family Social History:  Social History   Substance and Sexual Activity  Alcohol Use No  . Alcohol/week: 0.0 standard drinks   Comment: 12-25-2015 per pt no     Social History   Substance and Sexual Activity  Drug Use No   Comment: 21-12-2015 per pt no     Social History   Socioeconomic History  . Marital status: Widowed    Spouse name: [name]  .  Number of children: 2  . Years of education: 12 +  . Highest education level: Not on file  Occupational History  . Occupation: disability, medical  Social Needs  . Financial resource strain: Not very hard  . Food insecurity:    Worry: Never true    Inability: Never true  . Transportation needs:    Medical: No    Non-medical: No  Tobacco Use  . Smoking status: Never Smoker  . Smokeless tobacco: Never Used  Substance and Sexual Activity  . Alcohol use: No    Alcohol/week: 0.0 standard drinks    Comment: 12-25-2015 per pt no  . Drug use: No    Comment: 21-12-2015 per pt no   . Sexual activity: Not Currently    Birth control/protection: Surgical  Lifestyle  . Physical activity:    Days per week: 7 days    Minutes per session: 60 min  . Stress: Rather much  Relationships  . Social connections:    Talks on phone: More than three times a week    Gets together: More than three times a week    Attends religious service: More than 4 times per year    Active member of club or organization: No    Attends meetings of clubs or organizations: Not on file    Relationship status: Widowed  Other Topics Concern  . Not on file  Social History Narrative   Patient has two adult children.   Patient is disabled.   Patient has a high school education and some trade school.   Patient is right-handed.   Patient drinks four glasses of soda and tea daily.    Hospital Course:  10/29/17 [facility] Counselor Assessment: 52 y.o. female. Pt reports SI. Pt attempted to overdose on her prescription medication. Pt reports a previous SI attempt. Pt reports 1 previous inpatient admission. Pt denies HI and AVH. Pt states her husband passed away in Sep 03, 2017 and she's having a difficult time processing his death. The Pt states that she was looking at her deceased husband's pictures today and became extremely depressed. Per Pt she contacted the Sherriff's department and informed them that she was depressed and  suicidal. Pt denies current mental health treatment. Pt denies current mental health medication. [facility] department transported her to [facility]. The Pt denies SA   10/31/17 [facility] MD Assessment: 52 year old female, lives alone, reports husband passed away in 03-Sep-2017. Reports she has been experiencing bereavement and grief symptoms. States " I just kind of felt alone, not seeing my husband in the recliner where he used to sit", states she had obtained a pet dog for company but felt overwhelmed with having to walk the dog several times a day. On day of admission she called EMS reporting suicidal ideations, which she states she had only experienced on that day. Denies any specific suicidal plan  or intention. Reports some neuro-vegetative symptoms as below. Denies psychotic symptoms.   Patient remained on the [facility] unit for 4 days. The patient stabilized on medication and therapy. Patient was discharged on Neurontin 600 mg TID, Vistaril 25 mg TID PRN, Lamictal 150 mg BID, Keppra 1000 mg Daily and 1500 mg QHS, Remeron 30 mg QHS, Mirapex 0.5 mg BID, and she will continue her Ativan she has at home and she was instructed to take half a tablet to equal 0.5 mg TID. Patient has shown improvement with improved mood, affect, sleep, appetite, and interaction. Patient has attended group and participated. Patient has been seen in the day room interacting with peers and staff appropriately. Patient denies any SI/HI/AVH and contracts for safety. Patient agrees to [facility] [place]. Patient is provided with prescriptions for their medications upon discharge.    Physical Findings: AIMS: Facial and Oral Movements Muscles of Facial Expression: None, normal Lips and Perioral Area: None, normal Jaw: None, normal Tongue: None, normal,Extremity Movements Upper (arms, wrists, hands, fingers): None, normal Lower (legs, knees, ankles, toes): None, normal, Trunk Movements Neck, shoulders, hips:  None, normal, Overall Severity Severity of abnormal movements (highest score from questions above): None, normal Incapacitation due to abnormal movements: None, normal Patient's awareness of abnormal movements (rate only patient's report): No Awareness, Dental Status Current problems with teeth and/or dentures?: No Does patient usually wear dentures?: No  CIWA:  CIWA-Ar Total: 1 COWS:  COWS Total Score: 2  Musculoskeletal: Strength & Muscle Tone: within normal limits Gait & Station: normal Patient leans: N/A  Psychiatric Specialty Exam: Physical Exam  Nursing note and vitals reviewed. Constitutional: She is oriented to person, place, and time. She appears well-developed and well-nourished.  Cardiovascular: Normal rate.  Respiratory: Effort normal.  Musculoskeletal: Normal range of motion.  Neurological: She is alert and oriented to person, place, and time.  Skin: Skin is warm.    Review of Systems  Constitutional: Negative.   HENT: Negative.   Eyes: Negative.   Respiratory: Negative.   Cardiovascular: Negative.   Gastrointestinal: Negative.   Genitourinary: Negative.   Musculoskeletal: Negative.   Skin: Negative.   Neurological: Negative.   Endo/Heme/Allergies: Negative.   Psychiatric/Behavioral: Negative.     Blood pressure (!) 115/97, pulse 93, temperature 98.2 F (36.8 C), temperature source Oral, resp. rate 16, height 5' (1.524 m), weight 74.8 kg, SpO2 97 %.Body mass index is 32.22 kg/m.  General Appearance: Casual  Eye Contact:  Good  Speech:  Clear and Coherent and Normal Rate  Volume:  Normal  Mood:  Euthymic  Affect:  Congruent  Thought Process:  Goal Directed and Descriptions of Associations: Intact  Orientation:  Full (Time, Place, and Person)  Thought Content:  WDL  Suicidal Thoughts:  No  Homicidal Thoughts:  No  Memory:  Immediate;   Good Recent;   Good Remote;   Good  Judgement:  Fair  Insight:  Fair  Psychomotor Activity:  Normal   Concentration:  Concentration: Good and Attention Span: Good  Recall:  Good  Fund of Knowledge:  Good  Language:  Good  Akathisia:  No  Handed:  Right  AIMS (if indicated):     Assets:  Communication Skills Desire for Improvement Financial Resources/Insurance Housing Social Support Transportation  ADL's:  Intact  Cognition:  WNL  Sleep:  Number of Hours: 6.75     Have you used any form of tobacco in the last 30 days? (Cigarettes, Smokeless Tobacco, Cigars, and/or Pipes): No  Has this patient used any form of tobacco in the last 30 days? (Cigarettes, Smokeless Tobacco, Cigars, and/or Pipes) Yes, No  Blood Alcohol level:  Lab Results  Component Value Date   ETH <10 10/29/2017   ETH <10 27/03/5007    Metabolic Disorder Labs:  Lab Results  Component Value Date   HGBA1C 5.4 11/28/2015   MPG 108 11/28/2015   No results found for: PROLACTIN Lab Results  Component Value Date   CHOL 151 09/21/2017   TRIG 124.0 09/21/2017   HDL 53.10 09/21/2017   CHOLHDL 3 09/21/2017   VLDL 24.8 09/21/2017   LDLCALC 73 09/21/2017   LDLCALC 86 07/07/2016    See Psychiatric Specialty Exam and Suicide Risk Assessment completed by Attending Physician prior to discharge.  Discharge destination:  Home  Is patient on multiple antipsychotic therapies at discharge:  No   Has Patient had three or more failed trials of antipsychotic monotherapy by history:  No  Recommended Plan for Multiple Antipsychotic Therapies: NA   Allergies as of 11/04/2017      Reactions   Acetaminophen Other (See Comments)   Has fatty deposits on liver   Benadryl [diphenhydramine Hcl] Other (See Comments)   hyperactivity and seizures   Codeine Itching, Rash   seizures   Dilantin [phenytoin Sodium Extended] Other (See Comments)   Elevated LFT's   Melatonin Other (See Comments)   seizures   Tramadol Other (See Comments)   "causes seizures"   Ultram [tramadol Hcl] Other (See Comments)   Seizures   Vimpat  [lacosamide] Other (See Comments)   Severe dizziness   Betadine [povidone Iodine] Rash   Latex Rash   Penicillins Itching, Rash   Has patient had a PCN reaction causing immediate rash, facial/tongue/throat swelling, SOB or lightheadedness with hypotension: No Has patient had a PCN reaction causing severe rash involving mucus membranes or skin necrosis: No Has patient had a PCN reaction that required hospitalization No Has patient had a PCN reaction occurring within the last 10 years: No If all of the above answers are "NO", then may proceed with Cephalosporin use.   Sulfa Antibiotics Rash   Tape Rash   Paper tape please   Vicodin [hydrocodone-acetaminophen] Itching      Medication List    STOP taking these medications   FLUoxetine HCl 60 MG Tabs   loratadine 10 MG tablet Commonly known as:  CLARITIN     TAKE these medications     Indication  gabapentin 600 MG tablet Commonly known as:  NEURONTIN Take 1 tablet (600 mg total) by mouth 3 (three) times daily.  Indication:  mood stability   hydrOXYzine 25 MG tablet Commonly known as:  ATARAX/VISTARIL Take 1 tablet (25 mg total) by mouth 3 (three) times daily as needed for anxiety.  Indication:  Feeling Anxious   lamoTRIgine 150 MG tablet Commonly known as:  LAMICTAL Take 1 tablet (150 mg total) by mouth 2 (two) times daily. For mood control What changed:  additional instructions  Indication:  mood stability   levETIRAcetam 1000 MG tablet Commonly known as:  KEPPRA Take 1 tablet (1,000 mg total) by mouth every morning. What changed:    when to take this  additional instructions  Indication:  Seizure   levETIRAcetam 750 MG tablet Commonly known as:  KEPPRA Take 2 tablets (1,500 mg total) by mouth at bedtime. What changed:  You were already taking a medication with the same name, and this prescription was added. Make sure you understand how and  when to take each.  Indication:  Seizure   LORazepam 0.5 MG  tablet Commonly known as:  ATIVAN Take 1 tablet (0.5 mg total) by mouth 3 (three) times daily. What changed:    medication strength  how much to take  when to take this  reasons to take this  Indication:  Feeling Anxious   mirtazapine 30 MG tablet Commonly known as:  REMERON Take 1 tablet (30 mg total) by mouth at bedtime. Mood control What changed:    medication strength  how much to take  additional instructions  Indication:  mood stability   pramipexole 0.5 MG tablet Commonly known as:  MIRAPEX Take 1 tablet (0.5 mg total) by mouth 2 (two) times daily.  Indication:  Per PCP      Follow-up Information    [facility] Go on 11/06/2017.   Specialty:  Behavioral Health Why:  Appointment is Friday, 11/06/17 at 10:00am with Dr. Armandina Stammer. Please be sure to bring your Photo ID and any discharge paperwork from this hospitalization including your medications.  Contact information: [address] [place] [phone]          Follow-up recommendations:  Continue activity as tolerated. Continue diet as recommended by your PCP. Ensure to keep all appointments with outpatient providers.  Comments:  Patient is instructed prior to discharge to: Take all medications as prescribed by his/her mental healthcare provider. Report any adverse effects and or reactions from the medicines to his/her outpatient provider promptly. Patient has been instructed & cautioned: To not engage in alcohol and or illegal drug use while on prescription medicines. In the event of worsening symptoms, patient is instructed to call the crisis hotline, 911 and or go to the nearest ED for appropriate evaluation and treatment of symptoms. To follow-up with his/her primary care provider for your other medical issues, concerns and or health care needs.    Signed: Lowry Ram Jatara Huettner, FNP 11/04/2017, 8:39 AM

## 2017-11-04 NOTE — Progress Notes (Signed)
Recreation Therapy Notes  Date: 10.23.19 Time: 0930 Location: [address] Dayroom  Group Topic: Stress Management  Goal Area(s) Addresses:  Patient will verbalize importance of using healthy stress management.  Patient will identify positive emotions associated with healthy stress management.   Intervention: Stress Management  Activity :  Guided Imagery.  LRT introduced the stress management technique of guided imagery.  LRT read a script to allow patients to envision their peaceful place.  Patients were to follow along as script was read to engage in the activity.  Education:  Stress Management, Discharge Planning.   Education Outcome: Acknowledges edcuation/In group clarification offered/Needs additional education  Clinical Observations/Feedback: Pt did not attend group.      Victorino Sparrow, LRT/CTRS         Ria Comment, Rahcel Shutes A 11/04/2017 11:21 AM

## 2017-11-04 NOTE — Progress Notes (Signed)
Pt has been observed sitting in the dayroom all evening watching TV with minimal interaction with other patients.  She reports she had a good day.  She denies SI/HI/AVH.  She did ask Probation officer if she had any more Ativan to take this evening, but pt was informed that the Ativan was scheduled, and she had already had her prescribed doses today.  Pt was given her scheduled night meds along with a prn dose of Vistaril 25 mg at bedtime.  Pt makes her needs known to staff.  Support and encouragement offered.  Discharge plans are in process.  Safety maintained with q15 minute checks.

## 2017-11-04 NOTE — BHH Suicide Risk Assessment (Signed)
[employer] [facility] Discharge Suicide Risk Assessment   Principal Problem: <principal problem not specified> Discharge Diagnoses:  Patient Active Problem List   Diagnosis Date Noted  . MDD (major depressive disorder), severe ([place]) [F32.2] 10/30/2017  . Widowed - July 2019 [Z63.4] 08/07/2017  . Sleep apnea [G47.30] 06/09/2017  . Colon polyp [K63.5] 06/05/2017  . Elevated liver function tests [R94.5] 06/05/2017  . Fatty liver [K76.0]   . Mild neurocognitive disorder [G31.84] 02/04/2017  . Primary insomnia [F51.01] 05/19/2016  . Bereavement [Z63.4] 12/25/2015  . Partial symptomatic epilepsy with complex partial seizures, not intractable, with status epilepticus ([place]) [G40.201] 07/24/2015  . Insomnia w/ sleep apnea [G47.00, G47.30] 07/24/2015  . OA (osteoarthritis) of knee [M17.10] 05/07/2015  . 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 Major depressive disorder, single episode [F32.9] 10/14/2010    Total Time spent with patient: 15 minutes  Musculoskeletal: Strength & Muscle Tone: within normal limits Gait & Station: normal Patient leans: N/A  Psychiatric Specialty Exam: Review of Systems  All other systems reviewed and are negative.   Blood pressure (!) 115/97, pulse 93, temperature 98.2 F (36.8 C), temperature source Oral, resp. rate 16, height 5' (1.524 m), weight 74.8 kg, SpO2 97 %.Body mass index is 32.22 kg/m.  General Appearance: Casual  Eye Contact::  Good  Speech:  Normal Rate409  Volume:  Normal  Mood:  Euthymic  Affect:  Congruent  Thought Process:  Coherent and Descriptions of Associations: Intact  Orientation:  Full (Time, Place, and Person)  Thought Content:  Logical  Suicidal Thoughts:  No  Homicidal Thoughts:  No  Memory:  Immediate;   Fair Recent;   Fair Remote;   Fair  Judgement:  Intact  Insight:  Fair  Psychomotor Activity:  Normal  Concentration:  Fair  Recall:  [employer] of Knowledge:Fair  Language: Fair  Akathisia:  Negative  Handed:  Right  AIMS (if  indicated):     Assets:  Communication Skills Desire for Improvement Housing Physical Health Resilience Social Support  Sleep:  Number of Hours: 6.75  Cognition: WNL  ADL's:  Intact   Mental Status Per Nursing Assessment::   On Admission:  Self-harm thoughts  Demographic Factors:  Divorced or widowed, Caucasian and Living alone  Loss Factors: Loss of significant relationship  Historical Factors: Impulsivity  Risk Reduction Factors:   Sense of responsibility to family, Positive social support and Positive coping skills or problem solving skills  Continued Clinical Symptoms:  Depression:   Impulsivity  Cognitive Features That Contribute To Risk:  None    Suicide Risk:  Minimal: No identifiable suicidal ideation.  Patients presenting with no risk factors but with morbid ruminations; may be classified as minimal risk based on the severity of the depressive symptoms  Follow-up [facility]. Go on 11/06/2017.   Specialty:  Behavioral Health Why:  Appointment is Friday, 11/06/17 at 10:00am with Dr. Armandina Stammer. Please be sure to bring your Photo ID and any discharge paperwork from this hospitalization including your medications.  Contact information: [address] [place] [phone]          Plan Of Care/Follow-up recommendations:  Activity:  ad lib  Sharma Covert, MD 11/04/2017, 7:38 AM

## 2017-11-04 NOTE — Progress Notes (Signed)
Adult Psychoeducational Group Note  Date:  11/04/2017 Time:  5:02 AM  Group Topic/Focus:  Wrap-Up Group:   The focus of this group is to help patients review their daily goal of treatment and discuss progress on daily workbooks.  Participation Level:  Active  Participation Quality:  Appropriate  Affect:  Appropriate  Cognitive:  Appropriate  Insight: Appropriate  Engagement in Group:  Engaged  Modes of Intervention:  Discussion  Additional Comments:  Pt attend wrap up group. Her day was positive. Pt said another pt from [facility] inspired her.  Lenice Llamas Long 11/04/2017, 5:02 AM

## 2017-11-04 NOTE — Progress Notes (Signed)
  [facility] Adult Case Management Discharge Plan :  Will you be returning to the same living situation after discharge:  Yes,  patient is returning home at discharge At discharge, do you have transportation home?: Yes,  patient reports her sister in law is picking her up at discharge Do you have the ability to pay for your medications: Yes,  [employer]; SSDI  Release of information consent forms completed and in the chart;  Patient's signature needed at discharge.  Patient to Follow up at: Follow-up Information    [facility] Go on 11/06/2017.   Specialty:  Behavioral Health Why:  Appointment is Friday, 11/06/17 at 10:00am with Dr. Armandina Stammer. Please be sure to bring your Photo ID and any discharge paperwork from this hospitalization including your medications.  Contact information: [address] [place] [phone]          Next level of care provider has access to [place] and Suicide Prevention discussed: Yes,  with the patient's sister in law  Have you used any form of tobacco in the last 30 days? (Cigarettes, Smokeless Tobacco, Cigars, and/or Pipes): No  Has patient been referred to the Quitline?: N/A patient is not a smoker  Patient has been referred for addiction treatment: N/A  Marylee Floras, [place] 11/04/2017, 11:02 AM

## 2017-11-04 NOTE — Progress Notes (Signed)
Nursing discharge note: Patient discharged home per MD order.  Patient received all personal belongings from unit and locker.  Reviewed AVS/transition record with patient and she indicates understanding.  Patient will follow up with [facility] in [place].  Patient denies any thoughts of self harm.  She left ambulatory with her daughter-in-law.

## 2017-11-04 NOTE — Plan of Care (Signed)
  Problem: Education: Goal: Knowledge of [facility] Education information/materials will improve Outcome: Completed/Met Goal: Emotional status will improve Outcome: Completed/Met Goal: Mental status will improve Outcome: Completed/Met Goal: Verbalization of understanding the information provided will improve Outcome: Completed/Met   Problem: Activity: Goal: Interest or engagement in activities will improve Outcome: Completed/Met Goal: Sleeping patterns will improve Outcome: Completed/Met   Problem: Coping: Goal: Ability to verbalize frustrations and anger appropriately will improve Outcome: Completed/Met Goal: Ability to demonstrate self-control will improve Outcome: Completed/Met   Problem: Health Behavior/Discharge Planning: Goal: Identification of resources available to assist in meeting health care needs will improve Outcome: Completed/Met Goal: Compliance with treatment plan for underlying cause of condition will improve Outcome: Completed/Met   Problem: Physical Regulation: Goal: Ability to maintain clinical measurements within normal limits will improve Outcome: Completed/Met   Problem: Safety: Goal: Periods of time without injury will increase Outcome: Completed/Met   Problem: Education: Goal: Knowledge of [facility] Education information/materials will improve Outcome: Completed/Met   Problem: Activity: Goal: Interest or engagement in leisure activities will improve Outcome: Completed/Met Goal: Imbalance in normal sleep/wake cycle will improve Outcome: Completed/Met   Problem: Health Behavior/Discharge Planning: Goal: Identification of resources available to assist in meeting health care needs will improve Outcome: Completed/Met   Problem: Medication: Goal: Compliance with prescribed medication regimen will improve Outcome: Completed/Met   Problem: Self-Concept: Goal: Ability to identify factors that promote anxiety will  improve Outcome: Completed/Met Goal: Level of anxiety will decrease Outcome: Completed/Met Goal: Ability to modify response to factors that promote anxiety will improve Outcome: Completed/Met

## 2017-11-06 ENCOUNTER — Ambulatory Visit (HOSPITAL_COMMUNITY): Payer: PPO | Admitting: Psychiatry

## 2017-11-06 ENCOUNTER — Other Ambulatory Visit: Payer: Self-pay | Admitting: *Deleted

## 2017-11-06 ENCOUNTER — Encounter (HOSPITAL_COMMUNITY): Payer: Self-pay | Admitting: Psychiatry

## 2017-11-06 VITALS — BP 110/80 | HR 72 | Ht 60.0 in | Wt 166.0 lb

## 2017-11-06 DIAGNOSIS — F322 Major depressive disorder, single episode, severe without psychotic features: Secondary | ICD-10-CM

## 2017-11-06 DIAGNOSIS — F419 Anxiety disorder, unspecified: Secondary | ICD-10-CM

## 2017-11-06 MED ORDER — SERTRALINE HCL 50 MG PO TABS: ORAL_TABLET | ORAL | 0 refills | Status: DC

## 2017-11-06 NOTE — Patient Outreach (Signed)
Initial transition of care call completed. Pt had her first outpt psych visit this am. She states she is doing better, but she did have a seizure last night. She has all her medications. She is being weaned off mirtazapine and has been restarted on sertraline.  We have made an appt for Tuesday for me to visit her at home. I want to pin down who is preparing her med box now and who is supervising that she is taking her meds as ordered. I would also like to get a family member to record any deviations from her regimen. Also I would like someone to keep record of her siezures. I have asked this before and have asked to be called when they happen. I will be happy if they are recorded and a description given and what they did at the time.  [name], MSN, [facility] Gerontological Nurse Practitioner [facility] Care Management [phone]

## 2017-11-06 NOTE — Patient Instructions (Addendum)
1. Start sertraline 25 mg daily for one week, then 50 mg daily for one week, then 100 mg daily  2. Decrease mirtazapine 15 mg at night for one week, then discontinue  3. Continue ativan 0.5 mg three times a day as needed for anxiety 4.Return to clinic in one for 30 mins 5. CONTACT INFORMATION  What to do if you need to get in touch with someone regarding a psychiatric issue:  1. EMERGENCY: For psychiatric emergencies (if you are suicidal or if there are any other safety issues) call 911 and/or go to your nearest Emergency Room immediately.   2. IF YOU NEED SOMEONE TO TALK TO RIGHT NOW: Given my clinical responsibilities, I may not be able to speak with you over the phone for a prolonged period of time.  a. You may always call The National Suicide Prevention Lifeline at 1-800-273-TALK (949)113-5493).  b. Your county of residence will also have local crisis services. For [facility]: [place] at [phone] ([place])
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
## 2017-11-10 ENCOUNTER — Other Ambulatory Visit: Payer: Self-pay | Admitting: *Deleted

## 2017-11-10 NOTE — Patient Outreach (Addendum)
[facility]) Care Management  11/10/2017  [name] January 21, 1965 [id number]   Subjective: Acute home visit, s/p hospitalization for suicidal ideation. [name] stayed at [facility] for 5 days. She had one seizure while in patient in a crowded room. She had one seizure the day after she came home. She has all her meds. Her [name] keeps her meds at her house and [name] walks to her mothers and picks up her days medications in a dose box.  She has vistaril ordered tid as needed and ativan tid routinely. HOWEVER, [name]'s [name] has decided that the [name] doesn't need either one routinely.  [name] reports she is doing much better. She went to church. She has gone out to eat. Her daughter in law spends most of the time with her during the day.  Objective: BP 100/62   Pulse 83   Resp 16   SpO2 97%  RRR Lungs are clear  Assessment:: Major depression       Seizure activity  Plan: I will talk to [name]'s [name] about Monica medication therapy. Emphasize need to follow the MD orders. I am                   going to ask her to do the following: Fix med box to include the vistaril tid and give [name] one ativan to                    take if needed during the day.          Encouraged [name] to call [facility]osp[facility]. Think about going to [place].          I will call [name] weekly this month and see her again in a month.  [name]. Monica Neither, MSN, [facility] Gerontological Nurse Practitioner [facility] Care Management [phone]

## 2017-11-25 ENCOUNTER — Other Ambulatory Visit: Payer: Self-pay | Admitting: *Deleted

## 2017-11-25 ENCOUNTER — Other Ambulatory Visit: Payer: Self-pay

## 2017-11-25 ENCOUNTER — Encounter: Payer: Self-pay | Admitting: Family Medicine

## 2017-11-25 ENCOUNTER — Ambulatory Visit (INDEPENDENT_AMBULATORY_CARE_PROVIDER_SITE_OTHER): Payer: PPO | Admitting: Family Medicine

## 2017-11-25 VITALS — BP 102/64 | HR 83 | Temp 98.0°F | Ht 60.0 in | Wt 169.0 lb

## 2017-11-25 DIAGNOSIS — D126 Benign neoplasm of colon, unspecified: Secondary | ICD-10-CM

## 2017-11-25 DIAGNOSIS — H66002 Acute suppurative otitis media without spontaneous rupture of ear drum, left ear: Secondary | ICD-10-CM

## 2017-11-25 MED ORDER — AZITHROMYCIN 250 MG PO TABS: ORAL_TABLET | ORAL | 0 refills | Status: DC

## 2017-11-25 NOTE — Patient Instructions (Addendum)
Please follow up if symptoms do not improve or as needed.  Please schedule AWV.   Take the antibiotics as directed.  advil for pain and mucinex for congestion will help.   We have referred you back to Dr. Henrene Pastor for your colonoscopy. You should be receiving a call to get that set up soon.   Please set up your mammogram.   Medicare recommends an Annual Wellness Visit for all patients. Please schedule this to be done with our Nurse Educator, Maudie Mercury. This is an informative "talk" visit; it's goals are to ensure that your health care needs are being met and to give you education regarding avoiding falls, ensuring you are not suffering from depression or problems with memory or thinking, and to educate you on Advance Care Planning. It helps me take good care of you!

## 2017-11-25 NOTE — Progress Notes (Signed)
Subjective   CC:  Chief Complaint  Patient presents with  . Ear Pain    right ear pain since last night, nasal congestion and dizziness     HPI: [name] is a 52 y.o. female who presents to the office today to address the problems listed above in the chief complaint.  Patient complains of typical URI symptoms including nasal congestion, mild sore throat, cough, and mild malaise.  Right ear started hurting last night. The symptoms have been present for 1-2 days. She denies high fever or productive cough, shortness of breath or significant GI symptoms.  Over-the-counter cold medicines have been minimally or mildly helpful.  Hospitalized for major depression last month: reports doing better know. Grieving over loss of husband.   Due for colonoscopy. [place].  Assessment  1. Non-recurrent acute suppurative otitis media of left ear without spontaneous rupture of tympanic membrane   2. Adenomatous polyp of colon, unspecified part of colon      Plan   AOM right with URI discussed dx; treat with abx and supportive care.   Refer back to GI for 52yr recall colonoscopy surveillance.  Supportive counseling for grief/depression. F/u with psych  Follow up: prn   Orders Placed This Encounter  Procedures  . Ambulatory referral to Gastroenterology   Meds ordered this encounter  Medications  . azithromycin (ZITHROMAX) 250 MG tablet    Sig: Take 2 tabs today, then 1 tab daily for 4 days    Dispense:  1 each    Refill:  0      I reviewed the patients updated PMH, FH, and SocHx.    Patient Active Problem List   Diagnosis Date Noted  . MDD (major depressive disorder), severe ([place]) 10/30/2017  . Widowed - July 2019 08/07/2017  . Sleep apnea 06/09/2017  . Colon polyp 06/05/2017  . Elevated liver function tests 06/05/2017  . Fatty liver   . Mild neurocognitive disorder 02/04/2017  . 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 Bereavement 12/25/2015  . Partial symptomatic epilepsy with  complex partial seizures, not intractable, with status epilepticus ([place]) 07/24/2015  . Insomnia w/ sleep apnea 07/24/2015  . OA (osteoarthritis) of knee 05/07/2015  . 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[place]) 08/17/2013  . Major depressive disorder, single episode 10/14/2010   Current Meds  Medication Sig  . gabapentin (NEURONTIN) 600 MG tablet Take 1 tablet (600 mg total) by mouth 3 (three) times daily.  . hydrOXYzine (ATARAX/VISTARIL) 25 MG tablet Take 1 tablet (25 mg total) by mouth 3 (three) times daily as needed for anxiety.  . lamoTRIgine (LAMICTAL) 150 MG tablet Take 1 tablet (150 mg total) by mouth 2 (two) times daily. For mood control  . levETIRAcetam (KEPPRA) 1000 MG tablet Take 1 tablet (1,000 mg total) by mouth every morning.  Marland Kitchen LORazepam (ATIVAN) 0.5 MG tablet Take 1 tablet (0.5 mg total) by mouth 3 (three) times daily.  . sertraline (ZOLOFT) 50 MG tablet 25 mg daily for one week, then 50 mg daily for one week, then 100 mg daily    Review of Systems: Constitutional: Negative for fever malaise or anorexia Cardiovascular: negative for chest pain Respiratory: negative for SOB or pleuritic chest pain Gastrointestinal: negative for abdominal pain  Objective  Vitals: BP 102/64   Pulse 83   Temp 98 F (36.7 C)   Ht 5' (1.524 m)   Wt 169 lb (76.7 kg)   SpO2 97%   BMI 33.01 kg/m  General: no acute respiratory distress  Psych:  Alert and  oriented, normal mood and affect HEENT: Normocephalic, nasal congestion present, TMs w/o erythemright tm with erythema, OP with erythema w/o exudate, + cervical LAD, supple neck  Cardiovascular:  RRR without murmur or gallop. no peripheral edema Respiratory:  Good breath sounds bilaterally, CTAB with normal respiratory effort Skin:  Warm, no rashes Neurologic:   Mental status is normal. normal gait  Commons side effects, risks, benefits, and alternatives for medications and treatment plan prescribed today were discussed, and the patient expressed understanding  of the given instructions. Patient is instructed to call or message via MyChart if he/she has any questions or concerns regarding our treatment plan. No barriers to understanding were identified. We discussed Red Flag symptoms and signs in detail. Patient expressed understanding regarding what to do in case of urgent or emergency type symptoms.   Medication list was reconciled, printed and provided to the patient in AVS. Patient instructions and summary information was reviewed with the patient as documented in the AVS. This note was prepared with assistance of Dragon voice recognition software. Occasional wrong-word or sound-a-like substitutions may have occurred due to the inherent limitations of voice recognition software

## 2017-11-25 NOTE — Patient Outreach (Signed)
Transition of care call attempted. Home phone was busy X 2 and cell went unanswered. I will try again tomorrow.  [name]. Myrtie Neither, MSN, [facility] Gerontological Nurse Practitioner [facility] Care Management [phone]

## 2017-11-26 ENCOUNTER — Other Ambulatory Visit: Payer: Self-pay | Admitting: *Deleted

## 2017-11-26 NOTE — Patient Outreach (Signed)
Transition of care call. [name] answered the cell phone today. She is pt's caregiver and daughter-in-law. She reports that everyone in their area is having phone problems that the workmen are working on the problem. I asked how [name] is doing and she said she had had one seizure when they went to play BINGO and the room got real loud and she got too hot. She reports she recovered without any complications and she has not had any more seizures. She reports her mood is stable.  I have asked if I could visit with them next Tuesday and bring a student with me and she reports that it would be fine. I'm scheduling the visit for 11:30.  [name], MSN, [facility] Gerontological Nurse Practitioner [facility] Care Management [phone]

## 2017-11-29 ENCOUNTER — Encounter (HOSPITAL_BASED_OUTPATIENT_CLINIC_OR_DEPARTMENT_OTHER): Payer: Self-pay | Admitting: Emergency Medicine

## 2017-11-29 ENCOUNTER — Emergency Department (HOSPITAL_BASED_OUTPATIENT_CLINIC_OR_DEPARTMENT_OTHER): Payer: PPO

## 2017-11-29 ENCOUNTER — Other Ambulatory Visit: Payer: Self-pay

## 2017-11-29 ENCOUNTER — Emergency Department (HOSPITAL_BASED_OUTPATIENT_CLINIC_OR_DEPARTMENT_OTHER)
Admission: EM | Admit: 2017-11-29 | Discharge: 2017-11-29 | Disposition: A | Discharge status: Home / Self Care | Payer: PPO | Attending: Emergency Medicine | Admitting: Emergency Medicine

## 2017-11-29 DIAGNOSIS — H60501 Unspecified acute noninfective otitis externa, right ear: Secondary | ICD-10-CM | POA: Diagnosis not present

## 2017-11-29 DIAGNOSIS — Z79899 Other long term (current) drug therapy: Secondary | ICD-10-CM | POA: Diagnosis not present

## 2017-11-29 DIAGNOSIS — R109 Unspecified abdominal pain: Secondary | ICD-10-CM | POA: Diagnosis not present

## 2017-11-29 DIAGNOSIS — H6091 Unspecified otitis externa, right ear: Secondary | ICD-10-CM | POA: Diagnosis not present

## 2017-11-29 DIAGNOSIS — N200 Calculus of kidney: Secondary | ICD-10-CM | POA: Diagnosis not present

## 2017-11-29 LAB — COMPREHENSIVE METABOLIC PANEL
ALT: 63 U/L — ABNORMAL HIGH (ref 0–44)
AST: 55 U/L — ABNORMAL HIGH (ref 15–41)
Albumin: 4.1 g/dL (ref 3.5–5.0)
Alkaline Phosphatase: 67 U/L (ref 38–126)
Anion gap: 9 (ref 5–15)
BUN: 15 mg/dL (ref 6–20)
CO2: 25 mmol/L (ref 22–32)
Calcium: 9.4 mg/dL (ref 8.9–10.3)
Chloride: 107 mmol/L (ref 98–111)
Creatinine, Ser: 0.65 mg/dL (ref 0.44–1.00)
GFR calc Af Amer: >60 mL/min (ref >60)
GFR calc non Af Amer: >60 mL/min (ref >60)
Glucose, Bld: 87 mg/dL (ref 70–99)
Potassium: 4.1 mmol/L (ref 3.5–5.1)
Sodium: 141 mmol/L (ref 135–145)
Total Bilirubin: 0.4 mg/dL (ref 0.3–1.2)
Total Protein: 7.4 g/dL (ref 6.5–8.1)

## 2017-11-29 LAB — CBC
HCT: 38 % (ref 36.0–46.0)
Hemoglobin: 12 g/dL (ref 12.0–15.0)
MCH: 28.2 pg (ref 26.0–34.0)
MCHC: 31.6 g/dL (ref 30.0–36.0)
MCV: 89.4 fL (ref 80.0–100.0)
Platelets: 284 10*3/uL (ref 150–400)
RBC: 4.25 MIL/uL (ref 3.87–5.11)
RDW: 13 % (ref 11.5–15.5)
WBC: 5.9 10*3/uL (ref 4.0–10.5)
nRBC: 0 % (ref 0.0–0.2)

## 2017-11-29 LAB — URINALYSIS, ROUTINE W REFLEX MICROSCOPIC
Bilirubin Urine: NEGATIVE
Glucose, UA: NEGATIVE mg/dL
Hgb urine dipstick: NEGATIVE
Ketones, ur: NEGATIVE mg/dL
Leukocytes, UA: NEGATIVE
Nitrite: NEGATIVE
Protein, ur: NEGATIVE mg/dL
Specific Gravity, Urine: 1.025 (ref 1.005–1.030)
pH: 6 (ref 5.0–8.0)

## 2017-11-29 LAB — LIPASE, BLOOD: Lipase: 46 U/L (ref 11–51)

## 2017-11-29 MED ORDER — CIPROFLOXACIN-DEXAMETHASONE 0.3-0.1 % OT SUSP: 4.0000 [drp] | Freq: Two times a day (BID) | OTIC | 0 refills | Status: DC

## 2017-11-29 MED ORDER — KETOROLAC TROMETHAMINE 30 MG/ML IJ SOLN
30.0000 mg | Freq: Once | INTRAMUSCULAR | Status: AC
  Administered 2017-11-29: 30 mg via INTRAVENOUS
  Filled 2017-11-29: qty 1

## 2017-11-29 MED ORDER — SODIUM CHLORIDE 0.9 % IV SOLN
Freq: Once | INTRAVENOUS | Status: AC
  Administered 2017-11-29: 11:00:00 via INTRAVENOUS

## 2017-11-29 MED ORDER — CYCLOBENZAPRINE HCL 10 MG PO TABS: 10.0000 mg | ORAL_TABLET | Freq: Two times a day (BID) | ORAL | 0 refills | Status: DC | PRN

## 2017-11-29 MED ORDER — NAPROXEN 375 MG PO TABS: 375.0000 mg | ORAL_TABLET | Freq: Two times a day (BID) | ORAL | 0 refills | Status: DC

## 2017-11-29 MED ORDER — MORPHINE SULFATE (PF) 4 MG/ML IV SOLN
4.0000 mg | Freq: Once | INTRAVENOUS | Status: AC
  Administered 2017-11-29: 4 mg via INTRAVENOUS
  Filled 2017-11-29: qty 1

## 2017-11-29 NOTE — ED Provider Notes (Signed)
[place] EMERGENCY DEPARTMENT Provider Note   CSN: [id number] Arrival date & time: 11/29/17  1037     History   Chief Complaint Chief Complaint  Patient presents with  . Flank Pain    HPI [name] is a 52 y.o. female.  HPI Patient reports pain in the left flank for about 2 days.  She reports that onset it was sharp and painful.  She reports yesterday it was so uncomfortable she spent most of the day in bed.  She reports it is no better today.  She reports she does have a history of kidney stones and thought that might be was going on.  She denies pain or burning with urination.  She has not seen blood in the urine.  She denies vomiting or diarrhea.  No bloody bowel movement.  Patient reports that it is quite uncomfortable with certain movements and position changes as well.  She thinks her last kidney stone was several years ago. Past Medical History:  Diagnosis Date  . Arthritis    "knees" (04/22/2012)  . Chronic bronchitis ([place])    "yearly; when the weather changes" (04/22/2012)  . Colon polyp   . Colon polyps    adenomatous and hyperplastic-  . Depression   . Eczema   . Epilepsy ([place])    "been having them right often here lately" (04/22/2012)  . Fatty liver   . High cholesterol   . History of kidney stones   . Osteoarthritis    [name] 04/22/2012  . Other convulsions 05/21/12   non-epileptic spells  . Restless leg   . Seizures ([place])   . Vertigo     Patient Active Problem List   Diagnosis Date Noted  . MDD (major depressive disorder), severe ([place]) 10/30/2017  . Widowed - July 2019 08/07/2017  . Sleep apnea 06/09/2017  . Colon polyp 06/05/2017  . Elevated liver function tests 06/05/2017  . Fatty liver   . Mild neurocognitive disorder 02/04/2017  . 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 Bereavement 12/25/2015  . Partial symptomatic epilepsy with complex partial seizures, not intractable, with status epilepticus ([place]) 07/24/2015  . Insomnia w/ sleep  apnea 07/24/2015  . OA (osteoarthritis) of knee 05/07/2015  . 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 Major depressive disorder, single episode 10/14/2010    Past Surgical History:  Procedure Laterality Date  . ABDOMINAL HYSTERECTOMY  2001  . BLADDER SUSPENSION    . BUNIONECTOMY Left 2000  . CESAREAN SECTION  1987; 1988  . COLONOSCOPY    . JOINT REPLACEMENT    . MASS EXCISION  10/22/2011   Procedure: EXCISION MASS;  Surgeon: [name], MD;  Location: [facility];  Service: General;  Laterality: Right;  excision right buttock mass  . TOTAL HIP ARTHROPLASTY Left 1993; 1995; 2000  . TOTAL KNEE ARTHROPLASTY Left 05/07/2015   Procedure: TOTAL LEFT KNEE ARTHROPLASTY;  Surgeon: [name], MD;  Location: [facility];  Service: Orthopedics;  Laterality: Left;  . TOTAL KNEE ARTHROPLASTY Right 10/01/2015   Procedure: RIGHT TOTAL KNEE ARTHROPLASTY;  Surgeon: [name], MD;  Location: [facility];  Service: Orthopedics;  Laterality: Right;     OB History   None      Home Medications    Prior to Admission medications   Medication Sig Start Date End Date Taking? Authorizing Provider  azithromycin (ZITHROMAX) 250 MG tablet Take 2 tabs today, then 1 tab daily for 4 days 11/25/17   [name], MD  ciprofloxacin-dexamethasone [facility]) OTIC suspension  Place 4 drops into the right ear 2 (two) times daily. 11/29/17   Charlesetta Shanks, MD  cyclobenzaprine (FLEXERIL) 10 MG tablet Take 1 tablet (10 mg total) by mouth 2 (two) times daily as needed for muscle spasms. 11/29/17   Charlesetta Shanks, MD  gabapentin (NEURONTIN) 600 MG tablet Take 1 tablet (600 mg total) by mouth 3 (three) times daily. 09/15/17   [name], MD  hydrOXYzine (ATARAX/VISTARIL) 25 MG tablet Take 1 tablet (25 mg total) by mouth 3 (three) times daily as needed for anxiety. 11/04/17   Money, Lowry Ram, FNP  lamoTRIgine (LAMICTAL) 150 MG tablet Take 1 tablet (150 mg total) by mouth 2 (two) times daily. For mood control 11/04/17   Money,  Lowry Ram, FNP  levETIRAcetam (KEPPRA) 1000 MG tablet Take 1 tablet (1,000 mg total) by mouth every morning. 11/04/17   Money, Lowry Ram, FNP  LORazepam (ATIVAN) 0.5 MG tablet Take 1 tablet (0.5 mg total) by mouth 3 (three) times daily. 11/04/17   Money, Lowry Ram, FNP  naproxen (NAPROSYN) 375 MG tablet Take 1 tablet (375 mg total) by mouth 2 (two) times daily with a meal. 11/29/17   Charlesetta Shanks, MD  pramipexole (MIRAPEX) 0.5 MG tablet Take 1 tablet (0.5 mg total) by mouth 2 (two) times daily. 11/04/17   Money, Lowry Ram, FNP  sertraline (ZOLOFT) 50 MG tablet 25 mg daily for one week, then 50 mg daily for one week, then 100 mg daily 11/06/17   Norman Clay, MD    Family History Family History  Problem Relation Age of Onset  . Cancer Mother   . Depression Father   . Cancer Brother   . Diabetes Brother   . Cancer Maternal Aunt   . Diabetes Maternal Aunt   . Bipolar disorder Son   . Drug abuse Son   . Heart attack Sister 77       during severe illness    Social History Social History   Tobacco Use  . Smoking status: Never Smoker  . Smokeless tobacco: Never Used  Substance Use Topics  . Alcohol use: No    Alcohol/week: 0.0 standard drinks    Comment: 12-25-2015 per pt no  . Drug use: No    Comment: 21-12-2015 per pt no      Allergies   Acetaminophen; Benadryl [diphenhydramine hcl]; Codeine; Dilantin [phenytoin sodium extended]; Melatonin; Tramadol; Ultram [tramadol hcl]; Vimpat [lacosamide]; Betadine [povidone iodine]; Latex; Penicillins; Sulfa antibiotics; Tape; and Vicodin [hydrocodone-acetaminophen]   Review of Systems Review of Systems  10 Systems reviewed and are negative for acute change except as noted in the HPI.  Physical Exam Updated Vital Signs BP 96/64   Pulse (!) 55   Temp 98.2 F (36.8 C) (Oral)   Resp 18   Ht 5' (1.524 m)   Wt 76.7 kg   SpO2 96%   BMI 33.01 kg/m   Physical Exam  Constitutional: She is oriented to person, place, and time. She  appears well-developed and well-nourished.  Patient appears uncomfortable.  She is lying on her right side.  She is alert and nontoxic.  No respiratory distress.  Status clear.  HENT:  Head: Normocephalic and atraumatic.  Right ear canal is mildly edematous and irregular.  I can visualize the drum completely however drum is dull in appearance.  No appearance of rupture.  Left TM has tympanosclerosis but otherwise normal.  Mucous membranes are pink and moist.  Posterior oropharynx widely patent.  Eyes: EOM are normal.  Cardiovascular:  Normal rate, regular rhythm, normal heart sounds and intact distal pulses.  Pulmonary/Chest: Effort normal and breath sounds normal.  Abdominal: Soft.  Mildly reproducible pain in the left lower quadrant.  No guarding.  Patient endorses discomfort to palpation over the left flank.  Musculoskeletal: Normal range of motion. She exhibits no edema or tenderness.  Neurological: She is alert and oriented to person, place, and time. No cranial nerve deficit. She exhibits normal muscle tone. Coordination normal.  Skin: Skin is warm and dry.  Psychiatric: She has a normal mood and affect.     ED Treatments / Results  Labs (all labs ordered are listed, but only abnormal results are displayed) Labs Reviewed  URINALYSIS, ROUTINE W REFLEX MICROSCOPIC - Abnormal; Notable for the following components:      Result Value   APPearance CLOUDY (*)    All other components within normal limits  COMPREHENSIVE METABOLIC PANEL - Abnormal; Notable for the following components:   AST 55 (*)    ALT 63 (*)    All other components within normal limits  LIPASE, BLOOD  CBC    EKG None  Radiology Ct Renal Stone Study  Result Date: 11/29/2017 CLINICAL DATA:  52 year old with left flank pain. Stone disease suspected. EXAM: CT ABDOMEN AND PELVIS WITHOUT CONTRAST TECHNIQUE: Multidetector CT imaging of the abdomen and pelvis was performed following the standard protocol without IV  contrast. COMPARISON:  02/19/2017 FINDINGS: Lower chest: Subtle patchy densities at the lung bases probably represent atelectasis. No large pleural effusions. Hepatobiliary: Normal appearance of the liver and gallbladder. No biliary dilatation. Pancreas: Unremarkable. No pancreatic ductal dilatation or surrounding inflammatory changes. Spleen: Normal in size without focal abnormality. Adrenals/Urinary Tract: Normal adrenal glands. Multiple stones in both kidneys without hydronephrosis. Large stones measure roughly 4 mm. No ureter stones. Urinary bladder is decompressed. Limited evaluation of the bladder due to the left hip arthroplasty. Stomach/Bowel: The cecum extends into the right pelvis. Again noted is a prominent calcification along the wall or adjacent to the right colon. Stomach and small bowel are unremarkable without dilatation and no evidence for obstruction. There is probably a normal appendix on sequence 2, image 64 but this area is difficult to evaluate. Vascular/Lymphatic: Atherosclerotic calcifications in the aorta and iliac arteries without aneurysm. No significant lymph node enlargement. Stable prominent left gonadal vein calcification. Reproductive: History of hysterectomy. Low-density structure in the central upper pelvis measuring 2.8 x 2.2 cm. There has been fullness in this area on prior examinations. Evidence for left ovarian tissue. Other: Negative for free fluid.  Negative for free air. Musculoskeletal: Left total hip arthroplasty is located. Degenerative facet disease at L4-L5 without significant spondylolisthesis. IMPRESSION: 1. Bilateral renal calculi without hydronephrosis. No ureter stones. 2. Probable adnexal cystic structure measuring roughly 2.8 cm. There has been fullness in this area on prior examinations and this could represent a physiologic cyst or follicle. Alternatively, this may be associated with a peritoneal inclusion cyst. This could be more definitively characterized with  ultrasound. Consider a follow-up pelvic ultrasound in 6-12 weeks. Electronically Signed   By: Markus Daft M.D.   On: 11/29/2017 12:14    Procedures Procedures (including critical care time)  Medications Ordered in ED Medications  morphine 4 MG/ML injection 4 mg (4 mg Intravenous Given 11/29/17 1122)  0.9 %  sodium chloride infusion ( Intravenous New Bag/Given 11/29/17 1121)  ketorolac (TORADOL) 30 MG/ML injection 30 mg (30 mg Intravenous Given 11/29/17 1122)     Initial Impression / Assessment and Plan /  ED Course  I have reviewed the triage vital signs and the nursing notes.  Pertinent labs & imaging results that were available during my care of the patient were reviewed by me and considered in my medical decision making (see chart for details).    At this time, no clear etiology for patient's flank pain.  Physical exam does not show any rashes or concerning soft tissue findings.  CT shows intrarenal stones but no ureteral stones.  Urinalysis is negative.  Patient has had prior hysterectomy.  CT identifies a small focus of cystic changes in the midline.  This is reported as being pre-existing or the of surveillance.  I do not feel this has any contributing factor to the patient's presentation.  Patient has been made aware of this and the recommendations for surveillance.  Patient is otherwise clinically well in appearance.  She does report she was getting some improvement with Flexeril at home.  We will continue treating his suspected musculoskeletal pain with close follow-up with PCP.  Patient also mentioned right ear complaint.  She reports she is been taking Zithromax as prescribed by her doctor for 4 days regarding pain in the right ear.  The canal is mildly edematous and irregular suggestive of otitis externa.  She does not have any large swelling or exudative material.  No wick needed at this time.  Will have patient start Ciprodex drops.  ENT exam is otherwise benign.  Patient is  discharged in good condition.  Follow-up plan in place.  Final Clinical Impressions(s) / ED Diagnoses   Final diagnoses:  Left flank pain  Acute otitis externa of right ear, unspecified type    ED Discharge Orders         Ordered    naproxen (NAPROSYN) 375 MG tablet  2 times daily with meals     11/29/17 1309    cyclobenzaprine (FLEXERIL) 10 MG tablet  2 times daily PRN     11/29/17 1309    ciprofloxacin-dexamethasone (CIPRODEX) OTIC suspension  2 times daily     11/29/17 1309           Charlesetta Shanks, MD 11/29/17 1318

## 2017-11-29 NOTE — Discharge Instructions (Signed)
1.  At this time there is no clear cause for your left-sided back pain.  There is not a retained kidney stone on your CT scan.  Take naproxen and Flexeril as prescribed for what appears to be musculoskeletal back pain.  Follow flank pain instructions for return precautions. 2.  Your CT scan showed a cystic-like structure in the area where your uterus used to be.  There has been a small change in this area from previous studies.  The radiologist stresses however that it is important this continue to be observed and may need ultrasound and continued monitoring.  Reviewed this with your family doctor and or your gynecologist. 3.  You are being given an eardrop for your swelling and pain in the right ear.  Use as prescribed.  Follow-up with your doctor in a week or less for recheck of your ear as well as your flank pain. 4.  If your symptoms are suddenly worsening, you are getting additional symptoms or generally getting worse, return to the emergency department.

## 2017-11-29 NOTE — ED Triage Notes (Signed)
Pt c/o left sided flank pain x 2 days. Pt has history of kidney stones.

## 2017-12-01 ENCOUNTER — Other Ambulatory Visit: Payer: Self-pay | Admitting: *Deleted

## 2017-12-01 NOTE — Patient Outreach (Signed)
[facility]) Care Management  12/01/2017  [name] 1965-06-02 [id number]   S:  Pt is doing well. Had one seizure last weekend at [facility] when the sound level became very loud. Her daughter in law was standing by and she caught her and lowered her to the ground. Pt did have tonic and clonic movements. R ear infection seems to be resolving.  O:  BP 110/80   Pulse 76   Resp 16   SpO2 93%        Pt LOC is minorly altered today. She is not as alert as usual and hesitates to answer questions, sometimes she cannot think of the answer. She denies having had a seizure. Daughter in law feels she may have had a seizure. RRR Lungs are clear R flack tenderness  A:  Seizure disorder       Major Depression  P:  I will call pt in one week.  [name], MSN, [facility] Gerontological Nurse Practitioner [facility] Care Management [phone]

## 2017-12-02 NOTE — Progress Notes (Signed)
[facility] MD/PA/NP OP Progress Note  12/07/2017 9:18 AM [name]  MRN:  [id number]  Chief Complaint:  Chief Complaint    Depression; Follow-up     HPI:  Patient presents for follow-up appointment for depression.  She states that she had a seizure; which she attributes to taking Flexeril which she took the day before for nephrolithiasis.  She was enjoying bingo at [employer] and had a seizure like episode. She agrees to contact her neurologist.  She has gotten a new cell phone and enjoys Facebook, connecting with her old friends.  She also enjoys being with her grandchildren, who checks with the patient almost every day. She takes care of her two year old niece, as her mother does not take good care of her. She wishes to be back on fluoxetine.  She is concerned about increasing appetite when she was started on sertraline.  Although she is aware that she stated fluoxetine did not work for the patient when she was at the hospital, she mentioned it as she was very overwhelmed being placed on handcuff at that time.  She has middle insomnia.  She feels less depressed.  She has fair concentration.  She denies SI.  She feels anxious and tense at times.  She denies panic attacks.   Her daughter presents to the interview; she states that [name] is doing better compared to last month.  Lorazepam filled on 09/03/2017   Wt Readings from Last 3 Encounters:  12/07/17 169 lb (76.7 kg)  11/29/17 169 lb (76.7 kg)  11/25/17 169 lb (76.7 kg)  weight 166 lb (75.3 kg),   Visit Diagnosis:    ICD-10-CM   1. MDD (major depressive disorder), recurrent episode, moderate ([place]) F33.1     Past Psychiatric History: Please see initial evaluation for full details. I have reviewed the history. No updates at this time.     Past Medical History:  Past Medical History:  Diagnosis Date  . Arthritis    "knees" (04/22/2012)  . Chronic bronchitis ([place])    "yearly; when the weather changes" (04/22/2012)  . Colon polyp    . Colon polyps    adenomatous and hyperplastic-  . Depression   . Eczema   . Epilepsy ([place])    "been having them right often here lately" (04/22/2012)  . Fatty liver   . High cholesterol   . History of kidney stones   . Osteoarthritis    [name] 04/22/2012  . Other convulsions 05/21/12   non-epileptic spells  . Restless leg   . Seizures ([place])   . Vertigo     Past Surgical History:  Procedure Laterality Date  . ABDOMINAL HYSTERECTOMY  2001  . BLADDER SUSPENSION    . BUNIONECTOMY Left 2000  . CESAREAN SECTION  1987; 1988  . COLONOSCOPY    . JOINT REPLACEMENT    . MASS EXCISION  10/22/2011   Procedure: EXCISION MASS;  Surgeon: [name], MD;  Location: [facility];  Service: General;  Laterality: Right;  excision right buttock mass  . TOTAL HIP ARTHROPLASTY Left 1993; 1995; 2000  . TOTAL KNEE ARTHROPLASTY Left 05/07/2015   Procedure: TOTAL LEFT KNEE ARTHROPLASTY;  Surgeon: [name], MD;  Location: [facility];  Service: Orthopedics;  Laterality: Left;  . TOTAL KNEE ARTHROPLASTY Right 10/01/2015   Procedure: RIGHT TOTAL KNEE ARTHROPLASTY;  Surgeon: [name], MD;  Location: [facility];  Service: Orthopedics;  Laterality: Right;    Family Psychiatric History: Please see initial evaluation for full  details. I have reviewed the history. No updates at this time.     Family History:  Family History  Problem Relation Age of Onset  . Cancer Mother   . Depression Father   . Cancer Brother   . Diabetes Brother   . Cancer Maternal Aunt   . Diabetes Maternal Aunt   . Bipolar disorder Son   . Drug abuse Son   . Heart attack Sister 9       during severe illness    Social History:  Social History   Socioeconomic History  . Marital status: Widowed    Spouse name: [name]  . Number of children: 2  . Years of education: 12 +  . Highest education level: Not on file  Occupational History  . Occupation: disability, medical  Social Needs  . Financial resource strain: Not very  hard  . Food insecurity:    Worry: Never true    Inability: Never true  . Transportation needs:    Medical: No    Non-medical: No  Tobacco Use  . Smoking status: Never Smoker  . Smokeless tobacco: Never Used  Substance and Sexual Activity  . Alcohol use: No    Alcohol/week: 0.0 standard drinks    Comment: 12-25-2015 per pt no  . Drug use: No    Comment: 21-12-2015 per pt no   . Sexual activity: Not Currently    Birth control/protection: Surgical  Lifestyle  . Physical activity:    Days per week: 7 days    Minutes per session: 60 min  . Stress: Rather much  Relationships  . Social connections:    Talks on phone: More than three times a week    Gets together: More than three times a week    Attends religious service: More than 4 times per year    Active member of club or organization: No    Attends meetings of clubs or organizations: Not on file    Relationship status: Widowed  Other Topics Concern  . Not on file  Social History Narrative   Patient has two adult children.   Patient is disabled.   Patient has a high school education and some trade school.   Patient is right-handed.   Patient drinks four glasses of soda and tea daily.    Allergies:  Allergies  Allergen Reactions  . Acetaminophen Other (See Comments)    Has fatty deposits on liver  . Benadryl [Diphenhydramine Hcl] Other (See Comments)    hyperactivity and seizures  . Codeine Itching and Rash    seizures  . Dilantin [Phenytoin Sodium Extended] Other (See Comments)    Elevated LFT's  . Melatonin Other (See Comments)    seizures  . Tramadol Other (See Comments)    "causes seizures"  . Ultram [Tramadol Hcl] Other (See Comments)    Seizures  . Vimpat [Lacosamide] Other (See Comments)    Severe dizziness  . Betadine [Povidone Iodine] Rash  . Latex Rash  . Penicillins Itching and Rash    Has patient had a PCN reaction causing immediate rash, facial/tongue/throat swelling, SOB or lightheadedness with  hypotension: No Has patient had a PCN reaction causing severe rash involving mucus membranes or skin necrosis: No Has patient had a PCN reaction that required hospitalization No Has patient had a PCN reaction occurring within the last 10 years: No If all of the above answers are "NO", then may proceed with Cephalosporin use.  . Sulfa Antibiotics Rash  . Tape Rash and  Other (See Comments)    Paper tape please Paper tape please  . Vicodin [Hydrocodone-Acetaminophen] Itching    Metabolic Disorder Labs: Lab Results  Component Value Date   HGBA1C 5.4 11/28/2015   MPG 108 11/28/2015   No results found for: PROLACTIN Lab Results  Component Value Date   CHOL 151 09/21/2017   TRIG 124.0 09/21/2017   HDL 53.10 09/21/2017   CHOLHDL 3 09/21/2017   VLDL 24.8 09/21/2017   LDLCALC 73 09/21/2017   LDLCALC 86 07/07/2016   Lab Results  Component Value Date   TSH 1.52 09/21/2017   TSH 1.900 07/07/2016    Therapeutic Level Labs: No results found for: LITHIUM No results found for: VALPROATE No components found for:  CBMZ  Current Medications: Current Outpatient Medications  Medication Sig Dispense Refill  . azithromycin (ZITHROMAX) 250 MG tablet Take 2 tabs today, then 1 tab daily for 4 days 1 each 0  . ciprofloxacin-dexamethasone (CIPRODEX) OTIC suspension Place 4 drops into the right ear 2 (two) times daily. 7.5 mL 0  . cyclobenzaprine (FLEXERIL) 10 MG tablet Take 1 tablet (10 mg total) by mouth 2 (two) times daily as needed for muscle spasms. 20 tablet 0  . gabapentin (NEURONTIN) 600 MG tablet Take 1 tablet (600 mg total) by mouth 3 (three) times daily. 270 tablet 3  . hydrOXYzine (ATARAX/VISTARIL) 25 MG tablet Take 1 tablet (25 mg total) by mouth 2 (two) times daily as needed for anxiety. 60 tablet 0  . lamoTRIgine (LAMICTAL) 150 MG tablet Take 1 tablet (150 mg total) by mouth 2 (two) times daily. For mood control 60 tablet 0  . levETIRAcetam (KEPPRA) 1000 MG tablet Take 1 tablet  (1,000 mg total) by mouth every morning. 30 tablet 0  . LORazepam (ATIVAN) 0.5 MG tablet Take 1 tablet (0.5 mg total) by mouth daily as needed for anxiety. 30 tablet 0  . naproxen (NAPROSYN) 375 MG tablet Take 1 tablet (375 mg total) by mouth 2 (two) times daily with a meal. 30 tablet 0  . pramipexole (MIRAPEX) 0.5 MG tablet Take 1 tablet (0.5 mg total) by mouth 2 (two) times daily. 30 tablet 0   No current facility-administered medications for this visit.      Musculoskeletal: Strength & Muscle Tone: within normal limits Gait & Station: normal Patient leans: N/A  Psychiatric Specialty Exam: Review of Systems  Psychiatric/Behavioral: Positive for depression. Negative for hallucinations, memory loss, substance abuse and suicidal ideas. The patient is nervous/anxious and has insomnia.   All other systems reviewed and are negative.   Blood pressure 116/77, pulse 69, height 5' (1.524 m), weight 169 lb (76.7 kg), SpO2 98 %.Body mass index is 33.01 kg/m.  General Appearance: Fairly Groomed  Eye Contact:  Good  Speech:  Clear and Coherent  Volume:  Normal  Mood:  Depressed  Affect:  Appropriate, Congruent and more reactive  Thought Process:  Coherent  Orientation:  Full (Time, Place, and Person)  Thought Content: Logical   Suicidal Thoughts:  No  Homicidal Thoughts:  No  Memory:  Immediate;   Good  Judgement:  Good  Insight:  Fair  Psychomotor Activity:  Normal  Concentration:  Concentration: Good and Attention Span: Good  Recall:  Good  Fund of Knowledge: Good  Language: Good  Akathisia:  No  Handed:  Right  AIMS (if indicated): not done  Assets:  Communication Skills Desire for Improvement  ADL's:  Intact  Cognition: WNL  Sleep:  Poor   Screenings: AIMS  ED to Hosp-Admission (Discharged) from 10/30/2017 in [facility]  AIMS Total Score  0    AUDIT     ED to Hosp-Admission (Discharged) from 10/30/2017 in [facility]  Alcohol Use Disorder Identification Test Final Score (AUDIT)  0    GAD-7     Office Visit from 01/31/2015 in [facility]  Total GAD-7 Score  15    Mini-Mental     Office Visit from 02/20/2014 in [facility]  Total Score (max 30 points )  30    PHQ2-9     Patient Outreach from 08/31/2017 in [facility] Visit from 04/17/2017 in [facility] Office Visit from 04/13/2017 in [facility] Office Visit from 02/03/2017 in [facility] Visit from 01/30/2017 in [facility]  PHQ-2 Total Score  2  1  2  2   0  PHQ-9 Total Score  14  -  17  13  -       Assessment and Plan:  ENRIQUETA AUGUSTA is a 52 y.o. year old female with a history of depression, pseudoseizure,partial symptomatic epilepsy with complex partial seizures, osteoarthritis, congenital hip dislocation s/p replacement, who presents for follow up appointment for MDD (major depressive disorder), recurrent episode, moderate ([place])  # MDD, moderate, recurrent without psychotic features Although there has been improvement in depressive symptoms, she reports strong preference to be back on fluoxetine.  After discussing the concern of the patient having relapsing depression, she politely asks to be back on fluoxetine given increased appetite on sertraline.  Will do cross taper from sertraline to fluoxetine.  Will continue hydroxyzine as needed for anxiety.  Will continue Ativan as needed for anxiety.  Discussed risk of dependence and oversedation.  She is interested in seeing a therapist again.   Plan I have reviewed and updated plans as below 1. Decrease sertraline 25 mg daily for one week, then discontinue  2. Start fluoxetine 20 mg daily for one week, then 40 mg daily for one week, then 60 mg daily  3. Continue hydroxyzine 25 mg twice a day as needed for anxiety 4. Continue  ativan 0.5 mgdaily as needed for anxiety 5. Return to clinic inone month for 15 mins Patient is on gabapentin, Lamictal, Keppra for seizure. She is also on pramipexole for restless leg - She asks that she hopes to have service dog. Will provide a letter to support this.  Past trials of medication: fluoxetine, sertraline (increased appetite), citalopram, Trazodone, lorazepam, clonazepam  I have reviewed suicide assessment in detail. No change in the following assessment.   The patient demonstrates the following risk factors for suicide: Chronic risk factors for suicide include: psychiatric disorder of depressionand previous suicide attempts of overdosing medication. Acute risk factorsfor suicide include: unemployment and her husband with terminal illness. Protective factorsfor this patient include: positive social support, coping skills and hope for the future. Considering these factors, the overall suicide risk at this point appears to be low. Patient isappropriate for outpatient follow up.Emergency resources which includes 911, ED, suicide crisis line [phone]) are discussed.   The duration of this appointment visit was 30 minutes of face-to-face time with the patient.  Greater than 50% of this time was spent in counseling, explanation of  diagnosis, planning of further management, and coordination of care.  Norman Clay, MD 12/07/2017, 9:18 AM

## 2017-12-05 ENCOUNTER — Other Ambulatory Visit: Payer: Self-pay | Admitting: Adult Health

## 2017-12-07 ENCOUNTER — Other Ambulatory Visit: Payer: Self-pay | Admitting: *Deleted

## 2017-12-07 ENCOUNTER — Ambulatory Visit (INDEPENDENT_AMBULATORY_CARE_PROVIDER_SITE_OTHER): Payer: PPO | Admitting: Psychiatry

## 2017-12-07 ENCOUNTER — Telehealth: Payer: Self-pay | Admitting: Neurology

## 2017-12-07 ENCOUNTER — Encounter (HOSPITAL_COMMUNITY): Payer: Self-pay | Admitting: Psychiatry

## 2017-12-07 VITALS — BP 116/77 | HR 69 | Ht 60.0 in | Wt 169.0 lb

## 2017-12-07 DIAGNOSIS — F331 Major depressive disorder, recurrent, moderate: Secondary | ICD-10-CM

## 2017-12-07 MED ORDER — HYDROXYZINE HCL 25 MG PO TABS: 25.0000 mg | ORAL_TABLET | Freq: Two times a day (BID) | ORAL | 0 refills | Status: DC | PRN

## 2017-12-07 MED ORDER — LORAZEPAM 0.5 MG PO TABS: 0.5000 mg | ORAL_TABLET | Freq: Every day | ORAL | 0 refills | Status: DC | PRN

## 2017-12-07 NOTE — Addendum Note (Signed)
Addended by: Norman Clay on: 12/07/2017 04:24 PM   Modules accepted: Orders

## 2017-12-07 NOTE — Patient Instructions (Signed)
1. Decrease sertraline 25 mg daily for one week, then discontinue  2. Start fluoxetine 20 mg daily for one week, then 40 mg daily for one week, then 60 mg daily  3. Continue hydroxyzine 25 mg  4. Continue ativan 0.5 mgdaily as needed for anxiety 5. Return to clinic inone month for 15 mins

## 2017-12-07 NOTE — Telephone Encounter (Signed)
Very concerning cluster of seizures in this patient , unclear what precipitated the development, Flexaril ( which I think unlikely) or an infection or Trauma. Keppra can not be used as an emergency medication - I would prescribe an under the tongue resolving xanax wafer, that can be deposited into the  bucchal pouch.

## 2017-12-07 NOTE — Telephone Encounter (Signed)
-----   Message from Deloria Lair, NP sent at 12/07/2017 11:25 AM EST ----- Regarding: Question about medication Dear Dr. Maureen Chatters,  I have been seeing your patient for transition of care. I saw her last week and she was not herself. She seemed confused, hesitated in answering questions or just would say," I don't know," to questions she usually answers right away.   Her daughter-in-law and I thought maybe she had had a seizure in her sleep, however, after I left, Mrs. Canizales had 3 seizures. So what we observed was evidently a prodrome.  Lawerance Bach also said she had been given cyclobenzaprine for flank pain and had taken one in the 24 hours before this occurred. I instructed her to not given that anymore and put it on her contraindication list.  I am wondering if you could suggest or educate me if we can give her a prn dose of her Keppra if this prodrome is witnessed again or something quick acting?   I appreciate your expertise and collaboration!  [name], MSN, [facility] Gerontological Nurse Practitioner [facility] Care Management [phone]

## 2017-12-07 NOTE — Patient Outreach (Signed)
Transition of care call. I spoke with pt's daughter-in-law, [name], who is [name]'s caregiver.[name] reports that [name] had 3 seizures after I left last week. This could explain why she was not quite herself last Tuesday (confused, slow to respond to questions, or she would just state, "I don't know," to questions she could normally easily answer. We thought maybe she had had a seizure during the night and this was her affect afterwards.[name] states it may have been potentiated by the flexeril she was given for her flank pain. She has not had any more flank pain.  I have instructed her not to give that to her anymore. I have put it on her allergy list as a contraindication.  [name] reports no other problems this week. [name] did attend her psychiatry visit this morning with no changes in her regimen.  I will message her neurologist to see if we can give her a prn medication for the sxs or prodrome we witnessed last week.  I will call them again next week.  [name]. Monica Neither, MSN, [facility] Gerontological Nurse Practitioner [facility] Care Management [phone]

## 2017-12-08 ENCOUNTER — Ambulatory Visit: Payer: Self-pay | Admitting: *Deleted

## 2017-12-14 ENCOUNTER — Other Ambulatory Visit: Payer: Self-pay | Admitting: *Deleted

## 2017-12-14 NOTE — Patient Outreach (Signed)
Telphone assessment. [name], pt's daughter-in-law spoke with me this am. She reports pt still has occasional seizures. She did have one 2 days ago.  I shared with [name] that Dr. Maureen Chatters suggested giving her a disolvable alrazolam. Pt has on hand lorazepam. 0.5 mg. I have suggested to [place] that when pt exhibits the signs we observed 2 weeks ago (confusion, inabilty to think clearly) that she administer a lorazepam 0.5. She reports she does do this normally. She puts the disolvable tablet under Mrs. Hard's tongue.  Lawerance Bach reports the Thanksgiving holiday was especially tough on Mrs. Jacuinde as this was first Thanksgiving without her husband.  I reviewed pt meds and realized that her mirtazapine was discontinued on her last visit with a behavioral MD on 11/25/17.  I have sent a request for pharmacy evalutation for a potentially safer antidepressant. The SSRIs don't seem to be agreeing with her (potentiating seizures, this is questionable).  I will see pt next week in her home.  [name], MSN, [facility] Gerontological Nurse Practitioner [facility] Care Management [phone]

## 2017-12-15 DIAGNOSIS — Z0271 Encounter for disability determination: Secondary | ICD-10-CM

## 2017-12-16 ENCOUNTER — Other Ambulatory Visit: Payer: Self-pay

## 2017-12-16 NOTE — Patient Outreach (Signed)
[facility]) Care Management  Between   12/16/2017  [name] 01/28/1965 [id number]  Reason for referral: review of antidepressants that can lower her seizure threshold  Referral source: [facility] NP Current insurance:HTN  PMHx:  Sleep apnea, fatty liver and complex partial seizures  HPI: [name] reports that she has not had a seizure in 2.5 weeks.  She attributes the increased seizure frequency to flexeril that she was prescribed (and discontinued) for a kidney stone.  She states that mirtazapine was discontinued about 1 month ago because her mother felt it made her have more frequent seizures.  She has since resumed Prozac 60 mg at bedtime.  She reports that the longest she has gone without a seizure is 6 months and that was ~ 2 years ago.  Patient describes being under a lot of stress after the passing of her husband a few months ago.  She reports that her income has decreased by $1500/mo as a result and states that she also worries about her incarcerated son.  She states that she is agreeable to grief counseling.   Objective: Lab Results  Component Value Date   CREATININE 0.65 11/29/2017   CREATININE 0.69 10/29/2017   CREATININE 0.66 09/21/2017    Lab Results  Component Value Date   HGBA1C 5.4 11/28/2015    Lipid Panel     Component Value Date/Time   CHOL 151 09/21/2017 0917   CHOL 164 07/07/2016 1100   TRIG 124.0 09/21/2017 0917   HDL 53.10 09/21/2017 0917   HDL 53 07/07/2016 1100   CHOLHDL 3 09/21/2017 0917   VLDL 24.8 09/21/2017 0917   LDLCALC 73 09/21/2017 0917   LDLCALC 86 07/07/2016 1100    BP Readings from Last 3 Encounters:  12/07/17 116/77  12/01/17 110/80  11/29/17 96/64    Allergies  Allergen Reactions  . Acetaminophen Other (See Comments)    Has fatty deposits on liver  . Benadryl [Diphenhydramine Hcl] Other (See Comments)    hyperactivity and seizures  . Codeine Itching and Rash    seizures  . Dilantin  [Phenytoin Sodium Extended] Other (See Comments)    Elevated LFT's  . Flexeril [Cyclobenzaprine] Other (See Comments)    Pt was given this for flank pain related to kidney stone hx. She had multiple seizures the following day. She has meds that the combination can lower her threshold for more seizure activity.  . Melatonin Other (See Comments)    seizures  . Tramadol Other (See Comments)    "causes seizures"  . Ultram [Tramadol Hcl] Other (See Comments)    Seizures  . Vimpat [Lacosamide] Other (See Comments)    Severe dizziness  . Mirtazapine Other (See Comments)    Potentiated seizure activity  . Betadine [Povidone Iodine] Rash  . Latex Rash  . Penicillins Itching and Rash    Has patient had a PCN reaction causing immediate rash, facial/tongue/throat swelling, SOB or lightheadedness with hypotension: No Has patient had a PCN reaction causing severe rash involving mucus membranes or skin necrosis: No Has patient had a PCN reaction that required hospitalization No Has patient had a PCN reaction occurring within the last 10 years: No If all of the above answers are "NO", then may proceed with Cephalosporin use.  . Sulfa Antibiotics Rash  . Tape Rash and Other (See Comments)    Paper tape please Paper tape please  . Vicodin [Hydrocodone-Acetaminophen] Itching   Medications Reviewed Today    Reviewed by Joetta Manners  D, [place] (Pharmacist) on 12/16/17 at [place] List Status: <None>  Medication Order Taking? Sig Documenting Provider Last Dose Status Informant  FLUoxetine (PROZAC) 20 MG capsule [id number] Yes Take 60 mg by mouth daily. [provider] Taking Active Self  gabapentin (NEURONTIN) 600 MG tablet [id number] Yes Take 1 tablet (600 mg total) by mouth 3 (three) times daily. [name], MD Taking Active   hydrOXYzine (ATARAX/VISTARIL) 25 MG tablet [id number] Yes Take 1 tablet (25 mg total) by mouth 2 (two) times daily as needed for anxiety. Norman Clay, MD Taking  Active   lamoTRIgine (LAMICTAL) 150 MG tablet [id number] Yes Take 1 tablet (150 mg total) by mouth 2 (two) times daily. For mood control Money, Lowry Ram, FNP Taking Active   levETIRAcetam (KEPPRA) 1000 MG tablet [id number] Yes Take 1 tablet (1,000 mg total) by mouth every morning. Money, Lowry Ram, FNP Taking Active            Med Note Myrtie Neither, [place]   Tue Nov 10, 2017 10:28 AM) Pt reports she now only has 1000 mg tabs. She takes one in the am and 1  1/2 tabs at hs.  LORazepam (ATIVAN) 0.5 MG tablet [id number] Yes Take 1 tablet (0.5 mg total) by mouth daily as needed for anxiety. Norman Clay, MD Taking Active   pramipexole (MIRAPEX) 0.5 MG tablet [id number] Yes Take 1 tablet (0.5 mg total) by mouth 2 (two) times daily. Money, Lowry Ram, FNP Taking Active            Med Note ([place] Nov 10, 2017 10:32 AM) Dewaine Conger only at hs.  Med List Note Valere Dross 11/13/15 1631): Mother: [name] [phone] helps with medications.            Assessment:  Drugs sorted by system:  Neurologic/Psychologic: fluoxetine,gabapentin, levetiracetam, lorazepam, pramipexole  Medication Review Findings: . Antidepressants- The antidepressants of choice in epileptic patients are the SSRIs.  The seizure incidence with these agents is ~ 0.1-0.2%.    Mirtazapine has seizure risk of 0.04%, but the family felt this contributed to her seizure frequency.  I would avoid bupropion.     . Hydroxyzine- Patient reports taking 2-3 times/week.  She states that lorazepam "does just as good" and she feels she could discontinue this medication.  Hydroxyzine (< 1% incidence) can lower the seizure threshold at higher doses.    . Patient states that she does not take other medications (OTC or herbal).  Instructed her not to take anything without consulting her doctor due to her history of seizures.    . Patient states that she has occasionally been delayed in taking her antiepileptic medications and  she states that she thinks that getting off schedule may play a factor in her increased seizure frequency.  Counseled on the importance of not missing a dose and trying to keep on a regular dosing schedule to maintain her blood levels of medications. She verbalized understanding.  . [name] states that she does not sleep well about 3 times/week.  She states that she tries to go to bed around 9 pm and will wake up around 2 am and will lie awake for~ 3 hours.  She reports that she is up early and does not nap.  Discussed the importance of sleep and how lack of sleep could cause her to have increased seizure frequency.  I requested that she keep a journal and write down when she doesn't sleep and for how long,  along with any deviations of her medication dosing times to see if there is an identifiable pattern.  I requested that she record when medications are started/stopped and the date/time of her seizures.  She could share her journal with her physicians to possibly make therapy descisions. [name] states that she used to journal daily, but stopped about 2 years ago.  She states that she recently bought a journal to start back.  Suggested that she also write about her day and thoughts and memories as a way to help cope with her grief.  She states that she had done that in the past and that she would burn the journals for closure.  Plan: Route note to [facility] NP, Deloria Lair.  Joetta Manners, PharmD Clinical Pharmacist [facility] [phone]

## 2017-12-22 ENCOUNTER — Encounter: Payer: Self-pay | Admitting: *Deleted

## 2017-12-22 ENCOUNTER — Other Ambulatory Visit: Payer: Self-pay | Admitting: *Deleted

## 2017-12-22 NOTE — Patient Outreach (Signed)
[facility]) Care Management  12/22/2017  DARNELLE DERRICK [birth year] [id number]   Discharge home visit.  S: [name] states she is better today. She reports she had a seizure in church yesterday. She did fall and bumped her head but did not lose consciousness. Her family and her church family all gathered around her and prayed for her. She recovered and stayed at the service. She reports she is taking her meds as ordered. She is trying to avoid stressful situations. She reports the conversation she had with Joetta Manners, Pharm.D. Was very helpful. In fact she had her journal in her hand when I arrived. Journaling seems to help express and vent her feelings. She is feeling less depressed and actually starting to do some traditional Christmas decorating.  O: BP 110/70   Pulse 77   Resp 16   SpO2 96%       RRR      Lungs are clear  A: Seizure Disorder     Depression  P:  I am closing the case today. Mrs. Sow has meet her goals except for obtaining an emergency alert which I have again encouraged her to get one. I have advised her that she can call me with any concerns as I can resume care management services.  [facility] CM Care Plan Problem One     Most Recent Value  Care Plan Problem One  Frequent seizures requiring hospitalization.  Role Documenting the Problem One  Care Management [place] for Problem One  Not Active  THN Long Term Goal   Pt will have improved safety of her enviroment  [facility]N Long Term Goal Start Date  11/13/17 [This goal has been extended due to a readmission.]  THN Long Term Goal Met Date  12/07/17  [facility] CM Short Term Goal #1   Pt will take her medications daily on schedule per report of pt and family.  THN CM Short Term Goal #1 Start Date  08/31/17  [facility] CM Short Term Goal #1 Met Date  09/23/17  THN CM Short Term Goal #2   Pt will obtain an emergency alert/GPS system.  THN CM Short Term Goal #2 Start Date  11/13/17 [This goal  has been reset since she had another readmission.]  THN CM Short Term Goal #2 Met Date  -- [stopped goal pt not interested]    THN CM Care Plan Problem Two     Most Recent Value  Care Plan Problem Two  Major depression disorder with grief reaction  Role Documenting the Problem Two  Care Management Coordinator  Care Plan for Problem Two  Not Active  Interventions for Problem Two Long Term Goal   Reinforced taking meds as scheduled.  [facility]N Long Term Goal  Pt will take her medications routinely as reported by pt, daugther in law or her mother over the next 60 days.  [facility]N Long Term Goal Start Date  11/13/17  [facility] Long Term Goal Met Date  12/22/17     [name], MSN, [facility] Gerontological Nurse Practitioner [facility] Care Management [phone]

## 2017-12-30 NOTE — Progress Notes (Signed)
[facility] MD/PA/NP OP Progress Note  01/08/2018 9:16 AM [name]  MRN:  [id number]  Chief Complaint:  Chief Complaint    Depression; Follow-up     HPI:  Patient presents for follow-up appointment for depression.  She states that she has not had seizure for the last 3 weeks.  She had decent Christmas EVe.  She had family gathering, which includes her granddaughters.  She reports it was after a long time she had a good time on Christmas eve since her father deceased 5 years ago.  Although she still misses her husband and felt depressed for a few days, she has been adjusting to the loss. She also states that she does not want to continue to suffer if she were to be in the similar health condition her husband had.  She has fair sleep.  She has fair motivation and energy.  She denies SI.  She feels anxious and tense at times.  She had a panic attack when she was around with many people during shopping.   Ativan filled on 12/23    Visit Diagnosis:    ICD-10-CM   1. MDD (major depressive disorder), recurrent episode, mild ([place]) F33.0     Past Psychiatric History: Please see initial evaluation for full details. I have reviewed the history. No updates at this time.     Past Medical History:  Past Medical History:  Diagnosis Date  . Arthritis    "knees" (04/22/2012)  . Chronic bronchitis ([place])    "yearly; when the weather changes" (04/22/2012)  . Colon polyp   . Colon polyps    adenomatous and hyperplastic-  . Depression   . Eczema   . Epilepsy ([place])    "been having them right often here lately" (04/22/2012)  . Fatty liver   . High cholesterol   . History of kidney stones   . Osteoarthritis    [name] 04/22/2012  . Other convulsions 05/21/12   non-epileptic spells  . Restless leg   . Seizures ([place])   . Vertigo     Past Surgical History:  Procedure Laterality Date  . ABDOMINAL HYSTERECTOMY  2001  . BLADDER SUSPENSION    . BUNIONECTOMY Left 2000  . CESAREAN SECTION  1987; 1988   . COLONOSCOPY    . JOINT REPLACEMENT    . MASS EXCISION  10/22/2011   Procedure: EXCISION MASS;  Surgeon: [name], MD;  Location: [facility];  Service: General;  Laterality: Right;  excision right buttock mass  . TOTAL HIP ARTHROPLASTY Left 1993; 1995; 2000  . TOTAL KNEE ARTHROPLASTY Left 05/07/2015   Procedure: TOTAL LEFT KNEE ARTHROPLASTY;  Surgeon: [name], MD;  Location: [facility];  Service: Orthopedics;  Laterality: Left;  . TOTAL KNEE ARTHROPLASTY Right 10/01/2015   Procedure: RIGHT TOTAL KNEE ARTHROPLASTY;  Surgeon: [name], MD;  Location: [facility];  Service: Orthopedics;  Laterality: Right;    Family Psychiatric History: Please see initial evaluation for full details. I have reviewed the history. No updates at this time.     Family History:  Family History  Problem Relation Age of Onset  . Cancer Mother   . Depression Father   . Cancer Brother   . Diabetes Brother   . Cancer Maternal Aunt   . Diabetes Maternal Aunt   . Bipolar disorder Son   . Drug abuse Son   . Heart attack Sister 55       during severe illness    Social History:  Social  History   Socioeconomic History  . Marital status: Widowed    Spouse name: [name]  . Number of children: 2  . Years of education: 12 +  . Highest education level: Not on file  Occupational History  . Occupation: disability, medical  Social Needs  . Financial resource strain: Not very hard  . Food insecurity:    Worry: Never true    Inability: Never true  . Transportation needs:    Medical: No    Non-medical: No  Tobacco Use  . Smoking status: Never Smoker  . Smokeless tobacco: Never Used  Substance and Sexual Activity  . Alcohol use: No    Alcohol/week: 0.0 standard drinks    Comment: 12-25-2015 per pt no  . Drug use: No    Comment: 21-12-2015 per pt no   . Sexual activity: Not Currently    Birth control/protection: Surgical  Lifestyle  . Physical activity:    Days per week: 7 days    Minutes per  session: 60 min  . Stress: Rather much  Relationships  . Social connections:    Talks on phone: More than three times a week    Gets together: More than three times a week    Attends religious service: More than 4 times per year    Active member of club or organization: No    Attends meetings of clubs or organizations: Not on file    Relationship status: Widowed  Other Topics Concern  . Not on file  Social History Narrative   Patient has two adult children.   Patient is disabled.   Patient has a high school education and some trade school.   Patient is right-handed.   Patient drinks four glasses of soda and tea daily.    Allergies:  Allergies  Allergen Reactions  . Acetaminophen Other (See Comments)    Has fatty deposits on liver  . Benadryl [Diphenhydramine Hcl] Other (See Comments)    hyperactivity and seizures  . Codeine Itching and Rash    seizures  . Dilantin [Phenytoin Sodium Extended] Other (See Comments)    Elevated LFT's  . Flexeril [Cyclobenzaprine] Other (See Comments)    Pt was given this for flank pain related to kidney stone hx. She had multiple seizures the following day. She has meds that the combination can lower her threshold for more seizure activity.  . Melatonin Other (See Comments)    seizures  . Tramadol Other (See Comments)    "causes seizures"  . Ultram [Tramadol Hcl] Other (See Comments)    Seizures  . Vimpat [Lacosamide] Other (See Comments)    Severe dizziness  . Mirtazapine Other (See Comments)    Potentiated seizure activity  . Betadine [Povidone Iodine] Rash  . Latex Rash  . Penicillins Itching and Rash    Has patient had a PCN reaction causing immediate rash, facial/tongue/throat swelling, SOB or lightheadedness with hypotension: No Has patient had a PCN reaction causing severe rash involving mucus membranes or skin necrosis: No Has patient had a PCN reaction that required hospitalization No Has patient had a PCN reaction occurring  within the last 10 years: No If all of the above answers are "NO", then may proceed with Cephalosporin use.  . Sulfa Antibiotics Rash  . Tape Rash and Other (See Comments)    Paper tape please Paper tape please  . Vicodin [Hydrocodone-Acetaminophen] Itching    Metabolic Disorder Labs: Lab Results  Component Value Date   HGBA1C 5.4 11/28/2015   MPG  108 11/28/2015   No results found for: PROLACTIN Lab Results  Component Value Date   CHOL 151 09/21/2017   TRIG 124.0 09/21/2017   HDL 53.10 09/21/2017   CHOLHDL 3 09/21/2017   VLDL 24.8 09/21/2017   LDLCALC 73 09/21/2017   LDLCALC 86 07/07/2016   Lab Results  Component Value Date   TSH 1.52 09/21/2017   TSH 1.900 07/07/2016    Therapeutic Level Labs: No results found for: LITHIUM No results found for: VALPROATE No components found for:  CBMZ  Current Medications: Current Outpatient Medications  Medication Sig Dispense Refill  . FLUoxetine (PROZAC) 20 MG capsule Take 60 mg by mouth daily.    Marland Kitchen gabapentin (NEURONTIN) 600 MG tablet Take 1 tablet (600 mg total) by mouth 3 (three) times daily. 270 tablet 3  . [START ON 02/02/2018] hydrOXYzine (ATARAX/VISTARIL) 25 MG tablet Take 1 tablet (25 mg total) by mouth 2 (two) times daily as needed for anxiety. 60 tablet 1  . lamoTRIgine (LAMICTAL) 150 MG tablet Take 1 tablet (150 mg total) by mouth 2 (two) times daily. For mood control 60 tablet 0  . levETIRAcetam (KEPPRA) 1000 MG tablet Take 1 tablet (1,000 mg total) by mouth every morning. 30 tablet 0  . [START ON 02/03/2018] LORazepam (ATIVAN) 0.5 MG tablet Take 1 tablet (0.5 mg total) by mouth daily as needed for anxiety. 30 tablet 1  . pramipexole (MIRAPEX) 0.5 MG tablet Take 1 tablet (0.5 mg total) by mouth 2 (two) times daily. 30 tablet 0   No current facility-administered medications for this visit.      Musculoskeletal: Strength & Muscle Tone: within normal limits Gait & Station: normal Patient leans: N/A  Psychiatric  Specialty Exam: Review of Systems  Psychiatric/Behavioral: Negative for depression, hallucinations, memory loss, substance abuse and suicidal ideas. The patient is nervous/anxious and has insomnia.   All other systems reviewed and are negative.   Blood pressure 117/79, pulse 74, height 5\' 1"  (1.549 m), weight 169 lb (76.7 kg).Body mass index is 31.93 kg/m.  General Appearance: Fairly Groomed  Eye Contact:  Good  Speech:  Clear and Coherent  Volume:  Normal  Mood:  "fine"  Affect:  Appropriate, Congruent and calm, reactive  Thought Process:  Coherent  Orientation:  Full (Time, Place, and Person)  Thought Content: Logical   Suicidal Thoughts:  No  Homicidal Thoughts:  No  Memory:  Immediate;   Good  Judgement:  Good  Insight:  Good  Psychomotor Activity:  Normal  Concentration:  Concentration: Good and Attention Span: Good  Recall:  Good  Fund of Knowledge: Good  Language: Good  Akathisia:  No  Handed:  Right  AIMS (if indicated): not done  Assets:  Communication Skills Desire for Improvement  ADL's:  Intact  Cognition: WNL  Sleep:  Fair   Screenings: AIMS     ED to Hosp-Admission (Discharged) from 10/30/2017 in [place] [facility]  AIMS Total Score  0    AUDIT     ED to Hosp-Admission (Discharged) from 10/30/2017 in [place] [facility]  Alcohol Use Disorder Identification Test Final Score (AUDIT)  0    GAD-7     Office Visit from 01/31/2015 in [facility]  Total GAD-7 Score  15    Mini-Mental     Office Visit from 02/20/2014 in [facility]  Total Score (max 30 points )  30    PHQ2-9     Patient Outreach from  08/31/2017 in [facility] Visit from 04/17/2017 in [facility] Visit from 04/13/2017 in [place] Visit from 02/03/2017 in [facility] Visit from 01/30/2017 in [facility]  PHQ-2 Total Score  2  1  2  2   0  PHQ-9 Total Score  14  -  17  13  -       Assessment and Plan:  DEANIA SIGUENZA is a 52 y.o. year old female with a history of depression,  pseudoseizure,partial symptomatic epilepsy with complex partial seizures, osteoarthritis, congenital hip dislocation s/p replacement, who presents for follow up appointment for MDD (major depressive disorder), recurrent episode, mild ([place])  # MDD, mild, recurrent without psychotic features There has been overall improvement in depressive symptoms since switching from sertraline to fluoxetine.  Will continue current dose of fluoxetine to target depression.  Will continue hydroxyzine as needed for anxiety.  Will continue Ativan as needed for anxiety.  Discussed risk of dependence and oversedation.  Validated her grief.  Discussed behavioral activation.   Plan I have reviewed and updated plans as below 1. Continue fluoxetine 60 mg daily  3. Continue hydroxyzine 25 mg twice a day as needed for anxiety 4.Continue ativan0.5mg daily as needed for anxiety 5. Return to clinic intwo months for 12mins Patient is on gabapentin, Lamictal, Keppra for seizure. She is also on pramipexole for restless leg - She asks that she hopes to have service dog. Will provide a letter to support this.  Past trials of medication: fluoxetine, sertraline (increased appetite), citalopram, Trazodone, lorazepam, clonazepam  I have reviewed suicide assessment in detail. No change in the following assessment.   The patient demonstrates the following risk factors for suicide: Chronic risk factors for suicide include: psychiatric disorder of depressionand previous suicide attempts of overdosing medication. Acute risk factorsfor suicide include: unemployment and her husband with terminal illness. Protective factorsfor this patient include: positive social support, coping skills and hope for the future. Considering these  factors, the overall suicide risk at this point appears to be low. Patient isappropriate for outpatient follow up.   Norman Clay, MD 01/08/2018, 9:16 AM

## 2018-01-04 ENCOUNTER — Other Ambulatory Visit (HOSPITAL_COMMUNITY): Payer: Self-pay | Admitting: Psychiatry

## 2018-01-04 MED ORDER — LORAZEPAM 0.5 MG PO TABS: 0.5000 mg | ORAL_TABLET | Freq: Every day | ORAL | 0 refills | Status: DC | PRN

## 2018-01-04 MED ORDER — HYDROXYZINE HCL 25 MG PO TABS: 25.0000 mg | ORAL_TABLET | Freq: Two times a day (BID) | ORAL | 0 refills | Status: DC | PRN

## 2018-01-08 ENCOUNTER — Ambulatory Visit (INDEPENDENT_AMBULATORY_CARE_PROVIDER_SITE_OTHER): Payer: PPO | Admitting: Psychiatry

## 2018-01-08 ENCOUNTER — Encounter (HOSPITAL_COMMUNITY): Payer: Self-pay | Admitting: Psychiatry

## 2018-01-08 VITALS — BP 117/79 | HR 74 | Ht 61.0 in | Wt 169.0 lb

## 2018-01-08 DIAGNOSIS — F330 Major depressive disorder, recurrent, mild: Secondary | ICD-10-CM

## 2018-01-08 MED ORDER — HYDROXYZINE HCL 25 MG PO TABS: 25.0000 mg | ORAL_TABLET | Freq: Two times a day (BID) | ORAL | 1 refills | Status: DC | PRN

## 2018-01-08 MED ORDER — LORAZEPAM 0.5 MG PO TABS: 0.5000 mg | ORAL_TABLET | Freq: Every day | ORAL | 1 refills | Status: DC | PRN

## 2018-01-08 NOTE — Patient Instructions (Signed)
1. Continue fluoxetine 60 mg daily  3. Continue hydroxyzine 25 mg twice a day as needed for anxiety 4.Continue ativan0.5mg daily as needed for anxiety 5. Return to clinic intwo months for 35mins

## 2018-01-18 ENCOUNTER — Other Ambulatory Visit: Payer: Self-pay | Admitting: *Deleted

## 2018-01-18 NOTE — Telephone Encounter (Signed)
These antiseizure medicine should be refilled by her neurologist.

## 2018-01-18 NOTE — Telephone Encounter (Signed)
Pt has a new pharmacy and they are requesting a new RX on the pending medication. Please advise/refill if appropriate.

## 2018-01-20 ENCOUNTER — Other Ambulatory Visit: Payer: Self-pay

## 2018-01-20 ENCOUNTER — Ambulatory Visit (INDEPENDENT_AMBULATORY_CARE_PROVIDER_SITE_OTHER): Payer: PPO | Admitting: Family Medicine

## 2018-01-20 ENCOUNTER — Encounter: Payer: Self-pay | Admitting: Family Medicine

## 2018-01-20 VITALS — BP 126/74 | HR 75 | Temp 98.0°F | Resp 16 | Ht 60.0 in | Wt 170.2 lb

## 2018-01-20 DIAGNOSIS — Z634 Disappearance and death of family member: Secondary | ICD-10-CM | POA: Diagnosis not present

## 2018-01-20 DIAGNOSIS — M545 Low back pain, unspecified: Secondary | ICD-10-CM

## 2018-01-20 MED ORDER — CYCLOBENZAPRINE HCL 10 MG PO TABS: 10.0000 mg | ORAL_TABLET | Freq: Three times a day (TID) | ORAL | 1 refills | Status: DC | PRN

## 2018-01-20 NOTE — Progress Notes (Signed)
Subjective  CC:  Chief Complaint  Patient presents with  . Flank Pain    Pain started 2 weeks ago on and off.. She denies taking anything for the pain.. Denies any injury    HPI: [name] is a 53 y.o. female who presents to the office today to address the problems listed above in the chief complaint.  [name] presents due to left low back pain.  Symptoms started about 2 days ago.  She had similar complaints back in November for which she was evaluated in the emergency room.  I reviewed those records.  Full work-up was negative.  She has been caring for HER-45-year-old granddaughter at home and she has a dog.  She has been doing some bending and lifting.  No radicular symptoms, no bowel or bladder dysfunction.  No gross hematuria.  No fevers chills nausea or vomiting.  Back in November, she was prescribed Flexeril and her pain resolved with that medication.  She is not currently taking anything for pain. Assessment  1. Acute left-sided low back pain without sciatica   2. Bereavement      Plan   Low back pain musculoskeletal in nature: Flexeril prescribed.  Intermittent Tylenol.  Stretching exercises.  Follow-up if needed.  No red flag symptoms identified  Bereavement: Recommend counseling.  Referral placed see AVS  Follow up: Return if symptoms worsen or fail to improve.  03/23/2018  No orders of the defined types were placed in this encounter.  Meds ordered this encounter  Medications  . cyclobenzaprine (FLEXERIL) 10 MG tablet    Sig: Take 1 tablet (10 mg total) by mouth 3 (three) times daily as needed for muscle spasms.    Dispense:  30 tablet    Refill:  1      I reviewed the patients updated PMH, FH, and SocHx.    Patient Active Problem List   Diagnosis Date Noted  . MDD (major depressive disorder), recurrent episode, mild ([place]) 01/08/2018  . Widowed - July 2019 08/07/2017  . Sleep apnea 06/09/2017  . Colon polyp 06/05/2017  . Elevated liver function tests  06/05/2017  . Fatty liver   . Mild neurocognitive disorder 02/04/2017  . 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 Bereavement 12/25/2015  . Partial symptomatic epilepsy with complex partial seizures, not intractable, with status epilepticus ([place]) 07/24/2015  . Insomnia w/ sleep apnea 07/24/2015  . OA (osteoarthritis) of knee 05/07/2015  . 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[place]) 08/17/2013   Current Meds  Medication Sig  . FLUoxetine (PROZAC) 20 MG capsule Take 60 mg by mouth daily.  Marland Kitchen gabapentin (NEURONTIN) 600 MG tablet Take 1 tablet (600 mg total) by mouth 3 (three) times daily.  Derrill Memo ON 02/02/2018] hydrOXYzine (ATARAX/VISTARIL) 25 MG tablet Take 1 tablet (25 mg total) by mouth 2 (two) times daily as needed for anxiety.  . lamoTRIgine (LAMICTAL) 150 MG tablet Take 1 tablet (150 mg total) by mouth 2 (two) times daily. For mood control  . levETIRAcetam (KEPPRA) 1000 MG tablet Take 1 tablet (1,000 mg total) by mouth every morning.  Marland Kitchen LORazepam (ATIVAN) 1 MG tablet Take 1 tablet by mouth 2 (two) times daily.  . pramipexole (MIRAPEX) 0.5 MG tablet Take 1 tablet (0.5 mg total) by mouth 2 (two) times daily.  . [DISCONTINUED] LORazepam (ATIVAN) 0.5 MG tablet Take 1 tablet (0.5 mg total) by mouth daily as needed for anxiety.    Allergies: Patient is allergic to acetaminophen; benadryl [diphenhydramine hcl]; codeine; dilantin [phenytoin sodium extended]; melatonin; tramadol; ultram [  tramadol hcl]; vimpat [lacosamide]; mirtazapine; betadine [povidone iodine]; latex; penicillins; sulfa antibiotics; tape; and vicodin [hydrocodone-acetaminophen]. Family History: Patient family history includes Bipolar disorder in her son; Cancer in her brother, maternal aunt, and mother; Depression in her father; Diabetes in her brother and maternal aunt; Drug abuse in her son; Heart attack (age of onset: 5) in her sister. Social History:  Patient  reports that she has never smoked. She has never used smokeless tobacco. She reports that she  does not drink alcohol or use drugs.  Review of Systems: Constitutional: Negative for fever malaise or anorexia Cardiovascular: negative for chest pain Respiratory: negative for SOB or persistent cough Gastrointestinal: negative for abdominal pain  Objective  Vitals: BP 126/74   Pulse 75   Temp 98 F (36.7 C) (Oral)   Resp 16   Ht 5' (1.524 m)   Wt 170 lb 3.2 oz (77.2 kg)   SpO2 98%   BMI 33.24 kg/m  General: no acute distress , A&Ox3 HEENT: PEERL, conjunctiva normal, Oropharynx moist,neck is supple Cardiovascular:  RRR without murmur or gallop.  Respiratory:  Good breath sounds bilaterally, CTAB with normal respiratory effort Skin:  Warm, no rashes Back: Mild scoliosis, left paravertebral muscles are tender and tight.  Pain with forward flexion and extension.  Left lateral flexion also painful.  Negative straight leg raise bilaterally normal gait.     Commons side effects, risks, benefits, and alternatives for medications and treatment plan prescribed today were discussed, and the patient expressed understanding of the given instructions. Patient is instructed to call or message via MyChart if he/she has any questions or concerns regarding our treatment plan. No barriers to understanding were identified. We discussed Red Flag symptoms and signs in detail. Patient expressed understanding regarding what to do in case of urgent or emergency type symptoms.   Medication list was reconciled, printed and provided to the patient in AVS. Patient instructions and summary information was reviewed with the patient as documented in the AVS. This note was prepared with assistance of Dragon voice recognition software. Occasional wrong-word or sound-a-like substitutions may have occurred due to the inherent limitations of voice recognition software

## 2018-01-20 NOTE — Patient Instructions (Signed)
Please return as needed.   Use the flexeril for muscular pain and spasm.  You may use tylenol for your pain.   Please call [facility] to schedule an appointment with Dr. Laroy Apple; she works at our office.  The phone number is: [phone]  If you have any questions or concerns, please don't hesitate to send me a message via MyChart or call the office at [phone]. Thank you for visiting with [name] today! It's our pleasure caring for you.

## 2018-01-21 ENCOUNTER — Other Ambulatory Visit: Payer: Self-pay | Admitting: Family Medicine

## 2018-01-22 NOTE — Telephone Encounter (Signed)
Please advise 

## 2018-01-25 ENCOUNTER — Telehealth: Payer: Self-pay | Admitting: Neurology

## 2018-01-25 ENCOUNTER — Other Ambulatory Visit: Payer: Self-pay | Admitting: Neurology

## 2018-01-25 MED ORDER — LEVETIRACETAM 1000 MG PO TABS: ORAL_TABLET | ORAL | 5 refills | Status: DC

## 2018-01-25 NOTE — Telephone Encounter (Signed)
Patient calling for refill of levETIRAcetam (KEPPRA) 1000 MG tablet to be sent to [facility] in [place] [phone]. Only has 1 more days worth of meds.

## 2018-01-25 NOTE — Telephone Encounter (Signed)
The refill has been sent to the pharmacy the patient mentioned. I have sent the refill based off recent office notes and what was reported when the patient was dc home which was 1000 mg keppra in the am and 1500 mg at bedtime.

## 2018-01-28 ENCOUNTER — Other Ambulatory Visit: Payer: Self-pay | Admitting: Adult Health

## 2018-01-29 ENCOUNTER — Encounter: Payer: Self-pay | Admitting: *Deleted

## 2018-01-29 MED ORDER — LEVETIRACETAM 1000 MG PO TABS: ORAL_TABLET | ORAL | 5 refills | Status: DC

## 2018-01-29 NOTE — Addendum Note (Signed)
Addended by: [name] on: 01/29/2018 04:45 PM   Modules accepted: Orders

## 2018-01-29 NOTE — Telephone Encounter (Signed)
Please call patient to clarify who has been prescribing her lamictal: her psychiatrist or neurologist? Does she take it for mood or seizures? Thanks.   She will need to get that refilled from one of them.

## 2018-01-29 NOTE — Telephone Encounter (Signed)
Lamictal is prescribed for mood disorder, and this would go through psychiatrist .

## 2018-01-29 NOTE — Telephone Encounter (Signed)
She can follow with PCP for that and see [name] only when having problems.

## 2018-02-18 ENCOUNTER — Encounter: Payer: Self-pay | Admitting: Internal Medicine

## 2018-02-18 ENCOUNTER — Other Ambulatory Visit: Payer: Self-pay | Admitting: Neurology

## 2018-02-18 ENCOUNTER — Encounter: Payer: Self-pay | Admitting: Family Medicine

## 2018-02-22 ENCOUNTER — Ambulatory Visit: Payer: PPO | Admitting: Neurology

## 2018-02-22 ENCOUNTER — Encounter: Payer: Self-pay | Admitting: Neurology

## 2018-02-22 VITALS — BP 116/88 | HR 78 | Ht 60.0 in | Wt 169.0 lb

## 2018-02-22 DIAGNOSIS — R569 Unspecified convulsions: Secondary | ICD-10-CM

## 2018-02-22 DIAGNOSIS — F445 Conversion disorder with seizures or convulsions: Secondary | ICD-10-CM | POA: Diagnosis not present

## 2018-02-22 MED ORDER — LAMOTRIGINE 150 MG PO TABS: 150.0000 mg | ORAL_TABLET | Freq: Two times a day (BID) | ORAL | 0 refills | Status: DC

## 2018-02-22 MED ORDER — LAMOTRIGINE 150 MG PO TABS: 150.0000 mg | ORAL_TABLET | Freq: Two times a day (BID) | ORAL | 11 refills | Status: DC

## 2018-02-22 MED ORDER — LEVETIRACETAM 1000 MG PO TABS: ORAL_TABLET | ORAL | 5 refills | Status: DC

## 2018-02-22 NOTE — Progress Notes (Signed)
PATIENT: [name] DOB: [birth year]  REASON FOR VISIT: follow up- seizures HISTORY FROM: patient and niece   Rv 02-22-2018, patient with non epileptic seizures ( see the unusual EMU report from 04-2017 [facility], after her medications were held, she had a "spell " and presented to the ED). I have the pleasure of seeing her today again after she had supposing the seizures.  Nurse practitioner [facility] send me a note on 07 December 2017.  She stated that she had seen the patient the week prior to Thanksgiving and she was not quite herself at the time she would be confused, hesitating and answering questions over just repeatedly say I do not know.  Her daughter-in-law was present and thought she may have had a seizure in her sleep however after the nurse practitioner left [name] reportedly had 3 spells.  She had been given Flexeril for flank pain and had taken 1 in the 24 hours before this occurred, however I think this is unlikely to be the cause.  I would like to add that the patient lost her husband July 2019 and is still grieving, there have been other psychosocial triggers and stressors.  She was having severe flank pain, finally had a fall in December 2019 when she tripped over her vacuum cleaner , hit her head, and went to the ED. She was found to have kidney stones, and a mass on the left ovary- she had not followed up- has only now made her GYN appointment.    She remains on Keppra at the time of the spells.  There is no way that Keppra could be used as a quick acting intervention but for this we can use a benzodiazepine that should dissolve under the tongue or in the buccal mucosa. Today she reports not having had "seizures " in a while- 3 weeks.      08-19-2017, RV . [name] is a 53 year old Caucasian female patient, recently widowed ( 08-11-2017) after 43 years of marriage. She is grieving, her husband had CHF and PE.  With the passing of her husband her spells have  increased, her stress level has increased.  In her last visit with Vaughan Browner here in the office be referred to [facility] for an EMU admission. She had one day in EMU - May 1st 2019. Her daughter reports that after the EEG was removed she had the 'big seizures " and [facility]. She had no family member with her - and that may have been an added difficulty.  Referral to psychology/ psychiatry . I explained that I need not to follow up with her, as I have nothing else to offer and her insomnia is secondary to pain.    Report from [place] - Dr. Coralyn Mark. 05-12-2017,  53 y.o. female w/ above documented PMHx/PSHx who presents to the ED for seizure like activity. MD that presented with pt was concerned about grand mal activity however pt had no tongue biting, no incontinence and no postictal confusion. She was tired but may also now be 2/2 ativan administration. In any case, pt has a hx on recent EMU admission of having had non- epileptic spells and feel that in the setting of her rapid improvement after mx grand mal seizure that this is more likely to be a non epileptic spell than seizures concerning for status since-  she otherwise reports feeling at her baseline (minus the fatigue). She reports compliance with  her home AED regimen.  In the absence of fevers, HA, neck pain or any other unexpected finding after a seizure, since she has a hx of seizures, will defer further evaluation at this time--including no bloodwork or CT imaging.  Pt was observed and after resting for a while reported feeling at her baseline.  She reports that the recent stress of her husbands illness is the likely trigger for her spells.  She was able to ambulate and tolerate PO without difficulty. They are amendable to d/c and have no other complaints or concerns. Usual and customary return precautions for seizure like activity was discussed with PT. All questions answered. PT stated understanding and  agreement with plan. Encouraged PCP follow up as needed and adherence to seizure precautions. Pt and VS stable at d/c.  Patient went to ED on 07-12-2017 once more , was briefly observed and released.    MM- The patient continues to have seizure events.  It is unclear whether these are always epileptic events or pseudoseizures.  I will try increasing Keppra to thousand milligrams in the morning and 1500 mg in the evening.  If this causes excessive drowsiness she will let us know.  She will remain on Lamictal 150 mg twice a day.  I will check blood work today.  The patient will be referred to [facility] for possible EMU admission.  The patient's last EMU admit was in 2004 and it demonstrated both epileptic events as well as pseudoseizures.  I will send the patient for reevaluation.  Patient is amenable to this plan.  She will call if her symptoms worsen.   18 -28-2018, [name] has recently been prescribed trazodone to help her sleep per her psychiatrist. She has seen Dr. Jannifer Franklin for intractable seizures and a visit about 2 months ago. She continues to take Lamictal, she continues to take Celexa. 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 She reports it is a pain that keeps her from sleeping now. Dr. Maureen Ralphs treats her for her orthopedic symptoms, she also reports that she tried melatonin which has failed, has tried Ambien which causes parasomnia. Her psychiatrist is weaning her off Klonopin. Her psychiatrist in [place]  is weaning her off- she has been taking it for 7 years, started under Dr Erling Cruz.  I explained that I need not to follow up with her, as I have nothing else to offer and her insomnia is secondary to pain.   HISTORY OF PRESENT ILLNESS: [name] is a 53 year old female with a history of seizures, for years was Dr. Bernardo Heater patient.  She reports that she had a seizure last week, Saturday.  She states that she's been taking Keppra and Lamictal  consistently excpet for Friday night- the missed dose may have provoked the seizure. . Denies missing any medication. She states that after hitting her head she suffered a headache for 2 days however that has resolved. The patient does not operate a motor vehicle. At home she is able to complete all ADLs independent. She is no longer on narcotic medication. She reports that she will be having a second  knee replacement in 4 month. Mr. Urschel has done well with her last full knee replacement in April of this year, she had a hip replacement already 17 years ago, both by Dr.alusio. [name] reports that she has insomnia problems. There is also a report that her husband is very hard of hearing to the TV  is very loud during the day and communication at times difficult. She has regular bedtimes between 9 and 10 PM, she will fall asleep at she will wake up around 2 AM and has great difficulties to resume sleep. She could use melatonin 3 mg at night as a natural supplement to help her stay asleep longer, if this should be to no avail I would consider a low-dose of Klonopin. In a seizure patient I would not like to use Ambien as most benzodiazepines have at least an additional seizure controlling affect. She also has a history of parasomnia / sleepwalking. She usually sleeps on her side, she sleeps on one pillow. The bedroom is cool, quiet and dark. She has a TV in the bedroom but cuts it off when she goes to bed. She is not sure what wakes her at 2 in the morning but it seems to be in early morning ritual and is very difficult for her to reinitiate sleep. Since her husband has noted her to snore but we have no reliable source reporting apnea, I will order an attended sleep study to test her.   HISTORY 02/01/2015 (MM): [name] is a 55 female with a history of seizures. She returns today for an evaluation. The patient had several seizures and was eventually taken to the emergency room. The patient did improve  once at the emergency room. She did not have to be intubated. Her Keppra was increased to 1000 mg twice a day. The patient continues on Lamictal. Since then the patient has not had any additional seizures. The patient's sister-in-law brought to my attention in private that she feels that the patient is taking pain medicine inappropriately. She is wondering if this was the cause of some of her symptoms/seizures when she went to the emergency room. She states that her physicians continue to give her pain medicine for her knee pain even after they have called and requested that she not be given this. Patient reports that she is able to complete ADLs independently. Patient does report ongoing depression. In the past she has been asked to follow-up with mental health however the patient has failed to do this. She is currently on Celexa.She denies any new neurological symptoms. She returns today for an evaluation.  HISTORY 08/21/14 [facility]):  [name] is here today for follow-up on her subjective memory complaints. A [place] cognitive assessment test was performed today in which she failed on the Trail Making Test and was unable to copy a cube. The contour of her clock was also father droopy but she placed the numbers and hands correctly. She was able to name objects she did very well in all parts of attention testing including serial sevens and repeated the complicated sentences her word fluency however was limited she only generated 8 words she was good as obstruction and fully oriented to place time and date her delayed recall testing showed 4 out of 5 delayed words to be correctly remembered this is a very good result she scored 26 out of 30 points. Patient is high school educated and this would be an normal score at 26-30.  We discussed anecdotal evidence for her spells- her husband reported that a couple of times they have been to the grocery store and she needed to sit down for moment she seems to be  mostly claustrophobic when surrounded by a lot of people. This may be more of an anxiety attack. Reports some insomnia, mostly related to being worried about her meanwhile  72 year old son. When she hasn't had a good night sleep she is more likely to be hypersensitive to claustrophobia or to other spells of anxiety or panic. No other "seizures " on the current medication.  REVIEW OF SYSTEMS: Out of a complete 14 system review of symptoms, the patient complains only of the following symptoms, and all other reviewed systems are negative.  Constipation, leg swelling, restless leg, joint pain, aching muscles, walking difficulty, headache, seizures, agitation, depression  ALLERGIES: Allergies  Allergen Reactions  . Acetaminophen Other (See Comments)    Has fatty deposits on liver  . Benadryl [Diphenhydramine Hcl] Other (See Comments)    hyperactivity and seizures  . Codeine Itching and Rash    seizures  . Dilantin [Phenytoin Sodium Extended] Other (See Comments)    Elevated LFT's  . Melatonin Other (See Comments)    seizures  . Tramadol Other (See Comments)    "causes seizures"  . Ultram [Tramadol Hcl] Other (See Comments)    Seizures  . Vimpat [Lacosamide] Other (See Comments)    Severe dizziness  . Mirtazapine Other (See Comments)    Potentiated seizure activity  . Betadine [Povidone Iodine] Rash  . Latex Rash  . Penicillins Itching and Rash    Has patient had a PCN reaction causing immediate rash, facial/tongue/throat swelling, SOB or lightheadedness with hypotension: No Has patient had a PCN reaction causing severe rash involving mucus membranes or skin necrosis: No Has patient had a PCN reaction that required hospitalization No Has patient had a PCN reaction occurring within the last 10 years: No If all of the above answers are "NO", then may proceed with Cephalosporin use.  . Sulfa Antibiotics Rash  . Tape Rash and Other (See Comments)    Paper tape please Paper tape please  .  Vicodin [Hydrocodone-Acetaminophen] Itching    HOME MEDICATIONS: Outpatient Medications Prior to Visit  Medication Sig Dispense Refill  . FLUoxetine (PROZAC) 20 MG capsule Take 60 mg by mouth daily.    Marland Kitchen gabapentin (NEURONTIN) 600 MG tablet Take 1 tablet (600 mg total) by mouth 3 (three) times daily. 270 tablet 3  . hydrOXYzine (ATARAX/VISTARIL) 25 MG tablet Take 1 tablet (25 mg total) by mouth 2 (two) times daily as needed for anxiety. 60 tablet 1  . lamoTRIgine (LAMICTAL) 150 MG tablet Take 1 tablet (150 mg total) by mouth 2 (two) times daily. For mood control 60 tablet 0  . levETIRAcetam (KEPPRA) 1000 MG tablet Take 1 in the morning and 1.5 tablet at bedtime. 75 tablet 5  . LORazepam (ATIVAN) 1 MG tablet Take 1 tablet by mouth 2 (two) times daily.    . pramipexole (MIRAPEX) 0.5 MG tablet TAKE 1 TABLET BY MOUTH TWICE DAILY 60 tablet 0  . cyclobenzaprine (FLEXERIL) 10 MG tablet Take 1 tablet (10 mg total) by mouth 3 (three) times daily as needed for muscle spasms. 30 tablet 1   No facility-administered medications prior to visit.     PAST MEDICAL HISTORY: Past Medical History:  Diagnosis Date  . Arthritis    "knees" (04/22/2012)  . Chronic bronchitis ([place])    "yearly; when the weather changes" (04/22/2012)  . Colon polyp   . Colon polyps    adenomatous and hyperplastic-  . Depression   . Eczema   . Epilepsy ([place])    "been having them right often here lately" (04/22/2012)  . Fatty liver   . High cholesterol   . History of kidney stones   . Osteoarthritis    /  notes 04/22/2012  . Other convulsions 05/21/12   non-epileptic spells  . Restless leg   . Seizures ([place])   . Vertigo     PAST SURGICAL HISTORY: Past Surgical History:  Procedure Laterality Date  . ABDOMINAL HYSTERECTOMY  2001  . BLADDER SUSPENSION    . BUNIONECTOMY Left 2000  . CESAREAN SECTION  1987; 1988  . COLONOSCOPY    . JOINT REPLACEMENT    . MASS EXCISION  10/22/2011   Procedure: EXCISION MASS;  Surgeon:  [name], MD;  Location: [facility];  Service: General;  Laterality: Right;  excision right buttock mass  . TOTAL HIP ARTHROPLASTY Left 1993; 1995; 2000  . TOTAL KNEE ARTHROPLASTY Left 05/07/2015   Procedure: TOTAL LEFT KNEE ARTHROPLASTY;  Surgeon: [name], MD;  Location: [facility];  Service: Orthopedics;  Laterality: Left;  . TOTAL KNEE ARTHROPLASTY Right 10/01/2015   Procedure: RIGHT TOTAL KNEE ARTHROPLASTY;  Surgeon: [name], MD;  Location: [facility];  Service: Orthopedics;  Laterality: Right;    FAMILY HISTORY: Family History  Problem Relation Age of Onset  . Cancer Mother   . Depression Father   . Cancer Brother   . Diabetes Brother   . Cancer Maternal Aunt   . Diabetes Maternal Aunt   . Bipolar disorder Son   . Drug abuse Son   . Heart attack Sister 80       during severe illness    SOCIAL HISTORY: Social History   Socioeconomic History  . Marital status: Widowed    Spouse name: [name]  . Number of children: 2  . Years of education: 12 +  . Highest education level: Not on file  Occupational History  . Occupation: disability, medical  Social Needs  . Financial resource strain: Not very hard  . Food insecurity:    Worry: Never true    Inability: Never true  . Transportation needs:    Medical: No    Non-medical: No  Tobacco Use  . Smoking status: Never Smoker  . Smokeless tobacco: Never Used  Substance and Sexual Activity  . Alcohol use: No    Alcohol/week: 0.0 standard drinks    Comment: 12-25-2015 per pt no  . Drug use: No    Comment: 21-12-2015 per pt no   . Sexual activity: Not Currently    Birth control/protection: Surgical  Lifestyle  . Physical activity:    Days per week: 7 days    Minutes per session: 60 min  . Stress: Rather much  Relationships  . Social connections:    Talks on phone: More than three times a week    Gets together: More than three times a week    Attends religious service: More than 4 times per year    Active member  of club or organization: No    Attends meetings of clubs or organizations: Not on file    Relationship status: Widowed  . Intimate partner violence:    Fear of current or ex partner: No    Emotionally abused: No    Physically abused: No    Forced sexual activity: No  Other Topics Concern  . Not on file  Social History Narrative   Patient has two adult children.   Patient is disabled.   Patient has a high school education and some trade school.   Patient is right-handed.   Patient drinks four glasses of soda and tea daily.      PHYSICAL EXAM  Vitals:   02/22/18 1325  BP: 116/88  Pulse: 78  Weight: 169 lb (76.7 kg)  Height: 5' (1.524 m)   Body mass index is 33.01 kg/m.  Generalized: Well developed, in no acute distress - slurred speech , dysphasia- and regressive behavior.  Childlike, needy. Also tearful and certainly under  distress by her husbands death.    Neck circumference is 14.5 inches, the patient's Mallampati is grade 3- her uvula is midline but doesn't lift completely up past the tongue ground, she does have symmetric facial movements, she has a mild ptosis of the right eye, long standing.  Neurological examination   Mentation: Alert oriented to time, place, history taking.  Follows all commands language fluent. Patient's speech is slightly slurred, lisps.   She states that this is because she does not have her dentures in- family confirms that she has spoken like this for years.   CN: ptosis on the right ,full EOM- no nystagmus. Symmetric facial strength , sensation intact. Tongue and uvula midline.   She has equal grip strength, full ROM for neck and shoulder.  She has very brisk DTRs , hyperreflexia in lower extremities.   Gait intact.    DIAGNOSTIC DATA (LABS, IMAGING, TESTING)  - I reviewed patient records, labs, notes, testing and imaging myself where available.  EMU notes 2018.  ED notes 12/2017     ASSESSMENT AND PLAN:  53 y.o. year old  female, looking older than her numeric age, here after yet another "seizure". This was another 30 minutes visit.  We should agree to the term "Other convulsions" - with 15 minutes of loss of awareness.   Reviewed EMU notes, EEG and ED notes and the telephone info through NP - all suggestive of non epileptic seizures.   She  has a past medical history of Arthritis, Chronic bronchitis ([place]), Colon polyp, Colon polyps, Depression, Eczema, Epilepsy ([place]), Fatty liver, High cholesterol, History of kidney stones, Osteoarthritis, Other convulsions (05/21/12), Restless leg, Seizures ([place]), and Vertigo. here with:    1. Grief reaction- high stress . 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 Recommend grief counseling- hospice in [place]. Their son is in prison.  She had total knee replacement in April and plans the other knee to be replaced in October, had a lot of pain. Dr. Maureen Ralphs.  Kidney stones with flank pain. She has a large ovarian mass, unclear if malignant .   2. Non epileptic seizures-I explained that I need not to follow up with her, as I have nothing else to offer and her insomnia is secondary to pain.  She resumed all her seizure medicines after only 1 day of EMU stay, in addition she is taking sleep medicine Remeron, Mirapex for restless legs, Ativan 1.5 mg as needed for anxiety at night. We change to a wafer of Xanax or ativan/   PS : Dr Erling Cruz had followed her for years, assuming she would have 2 or more seizure foci and possibly additional non-epileptic spells.  She had been referred for EMU, but resisted for a long time. The visit was finally not a success.  I will continue Keppra 500 mg to 3 tablets in the morning and 3 tablets in the evening. She will continue on Lamictal 100 mg twice a day as this medication is also mood stabilizing.  Her last spell/ seizure was 12-2018  RLS and pain medication filled by  PCP, new PCP is Billey Chang,  MD  Dr Irving Copas,  psychiatric care     [name], MD    02/22/2018, 2:12 PM [facility] [address] [place], [place] [address] [phone]

## 2018-02-22 NOTE — Addendum Note (Signed)
Addended by: [name] on: 02/22/2018 02:33 PM   Modules accepted: Orders

## 2018-02-22 NOTE — Patient Instructions (Signed)
Non-Epileptic Seizures, Adult  A seizure can cause:   Involuntary movements, like falling or shaking.   Changes in awareness or consciousness.   Convulsions. These are episodes of uncontrollable, jerking movement caused by sudden, intense tightening (contraction) of the muscles.  Epileptic seizures are caused by abnormal electrical activity in the brain. Non-epileptic seizures are different. They are not caused by abnormal electrical signals in your brain. These seizures may look like epileptic seizures, but they are not caused by epilepsy.  There are two types of non-epileptic seizures:   Physiologic non-epileptic seizure. This type results from an underlying problem that causes a disruption in your brain's electrical activity.   Psychogenic non-epileptic seizure. This type results from emotional stress. These seizures are sometimes called pseudoseizures.  What are the causes?  Causes of physiologic non-epileptic seizures can include:   Sudden drop in blood pressure.   Low blood sugar (glucose).   Low levels of salt (sodium) in your blood.   Low levels of calcium in your blood.   Migraine.   Heart rhythm problems.   Sleep disorders, such as narcolepsy.   Movement disorders, such as Tourette syndrome.   Infection.   Certain medicines.   Drug and alcohol abuse.   Fever.  Common causes of psychogenic non-epileptic seizures can include:   Stress.   Emotional trauma.   Sexual or physical abuse.   Major life events, such as divorce or death of a loved one.   Mental health disorders, including anxiety and depression.  What are the signs or symptoms?  Symptoms of a non-epileptic seizure can be similar to those of an epileptic seizure, which may include:   A change in attention or behavior (altered mental status).   Loss of consciousness or fainting.   Convulsions with rhythmic jerking movements.   Drooling.   Rapid eye movements.   Grunting.   Loss of bladder control and bowel  control.   Bitter taste in the mouth.   Tongue biting.  Some people experience unusual sensations (aura) before having a seizure. These can include:   "Butterflies" in the stomach.   Abnormal smells or tastes.   A feeling of having had a new experience before (dj vu).  After a non-epileptic seizure, you may have a headache or sore muscles or feel confused and sleepy. Non-epileptic seizures usually:   Do not cause physical injuries.   Start slowly.   Include crying or shrieking.   Last longer than 2 minutes.   Include pelvic thrusting.  How is this diagnosed?  Non-epileptic seizures may be diagnosed by:   Your medical history.   A physical exam.   Your symptoms.  ? Your health care provider may want to talk with your friends or relatives who have seen you have a seizure.  ? If possible, it is helpful if you write down your seizure activity, including what led up to the seizure, and share that information with your health care provider.  You may also need to have tests to look for causes of physiologic non-epileptic seizures. These may include:   An electroencephalogram (EEG). This test measures electrical activity in your brain. If you have had a non-epileptic seizure, the results of your EEG will likely be normal.   Video EEG. This test takes place in the hospital over the course of 2-7 days. The test uses a video camera and an EEG to monitor your symptoms and the electrical activity in your brain.   Blood tests.   Lumbar puncture.   This test involves pulling fluid from your spine to check for infection.   Electrocardiogram (ECG or EKG). This test checks for an abnormal heart rhythm.   CT scan.  If your health care provider thinks you have had a psychogenic non-epileptic seizure, you may need to see a mental health specialist for an evaluation.  How is this treated?  The treatment for your seizures will depend on what is causing them. When the underlying condition is treated, your seizures should  stop.  If your seizures are being caused by emotional trauma or stress, your health care provider may recommend that you see a mental health professional. Treatment may include:   Relaxation therapy or cognitive behavioral therapy.   Medicines to treat depression or anxiety.   Individual or family counseling.  In some cases, you may have psychogenic seizures in addition to epileptic seizures. If this is the case, you may be prescribed medicine to help with the epileptic seizures.  Follow these instructions at home:  Home care will depend on the type of non-epileptic seizures that you have. In general:   Follow all instructions from your health care provider. These may include ways to prevent seizures and what to do if you have a seizure.   Take over-the-counter and prescription medicines only as told by your health care provider.   Keep all follow-up visits as told by your health care provider. This is important.   Make sure family members, friends, and coworkers are trained on how to help you if you have a seizure. If you have a seizure, they should:  ? Lay you on the ground to prevent a fall.  ? Place a pillow or piece of clothing under your head.  ? Loosen any clothing around your neck.  ? Turn you onto your side. If vomiting occurs, this helps keep your airway clear.   Avoid any substances that may prevent your medicine from working properly. If you are prescribed medicine for seizures:  ? Do not use recreational drugs.  ? Limit or avoid alcoholic beverages.  Contact a health care provider if:   Your seizures change or become more frequent.   You continue to have seizures after treatment.  Get help right away if:   You injure yourself during a seizure.   You have one seizure after another.   You have trouble recovering from a seizure.   You have chest pain or trouble breathing.   You have a seizure that lasts longer than 5 minutes.  Summary   Non-epileptic seizures may look like epileptic  seizures, but they are not caused by epilepsy.   The treatment for your seizures will depend on what is causing them. When the underlying condition is treated, your seizures should stop.   Make sure family members, friends, and coworkers are trained on how to help you if you have a seizure. If you have a seizure, they should lay you on the ground to prevent a fall, protect your head and neck, and turn you onto your side.  This information is not intended to replace advice given to you by your health care provider. Make sure you discuss any questions you have with your health care provider.  Document Released: 02/14/2005 Document Revised: 10/12/2015 Document Reviewed: 10/12/2015  Elsevier Interactive Patient Education  2019 [employer]

## 2018-02-24 ENCOUNTER — Telehealth: Payer: Self-pay | Admitting: *Deleted

## 2018-02-24 NOTE — Telephone Encounter (Signed)
No call back received. No show letter sent to the patient. Procedure cancelled.

## 2018-02-24 NOTE — Telephone Encounter (Signed)
Patient no showed for Previsit appointment. Call placed to patient with message left to call back to reschedule Previsit. Patient also informed that if no call back received by 5 pm today, her colonoscopy will be cancelled.

## 2018-03-04 NOTE — Progress Notes (Signed)
[facility] MD/PA/NP OP Progress Note  03/11/2018 9:42 AM [name]  MRN:  [id number]  Chief Complaint:  Chief Complaint    Follow-up; Depression     HPI:  Patient presents very late for follow-up appointment for depression.  She states that she has been feeling better.  She has contacted friends from school on Facebook.  She also has been communicating with a man, who used to be a Chief Executive Officer.  Although she feels guilty communicating with him, she recalls the conversation with her deceased husband, who wanted the patient to be with somebody. She talks about flower bed, which [name] did. She still misses him (and smiles).  She also talks about her granddaughter, who lost the therapist.  They wrote a goodbye letter together.  She denies feeling depressed.  She has insomnia.  She has more motivation and energy.  She denies SI.  She occasionally feels anxious and tense.  She had a panic attack twice; it is much less compared to before.  She has not taken hydroxyzine or Ativan this week.   Lorazepam filled on 02/22/2018   Visit Diagnosis:    ICD-10-CM   1. MDD (major depressive disorder), recurrent episode, mild ([place]) F33.0     Past Psychiatric History: Please see initial evaluation for full details. I have reviewed the history. No updates at this time.     Past Medical History:  Past Medical History:  Diagnosis Date  . Arthritis    "knees" (04/22/2012)  . Chronic bronchitis ([place])    "yearly; when the weather changes" (04/22/2012)  . Colon polyp   . Colon polyps    adenomatous and hyperplastic-  . Depression   . Eczema   . Epilepsy ([place])    "been having them right often here lately" (04/22/2012)  . Fatty liver   . High cholesterol   . History of kidney stones   . Osteoarthritis    [name] 04/22/2012  . Other convulsions 05/21/12   non-epileptic spells  . Restless leg   . Seizures ([place])   . Vertigo     Past Surgical History:  Procedure Laterality Date  . ABDOMINAL HYSTERECTOMY  2001   . BLADDER SUSPENSION    . BUNIONECTOMY Left 2000  . CESAREAN SECTION  1987; 1988  . COLONOSCOPY    . JOINT REPLACEMENT    . MASS EXCISION  10/22/2011   Procedure: EXCISION MASS;  Surgeon: [name], MD;  Location: [facility];  Service: General;  Laterality: Right;  excision right buttock mass  . TOTAL HIP ARTHROPLASTY Left 1993; 1995; 2000  . TOTAL KNEE ARTHROPLASTY Left 05/07/2015   Procedure: TOTAL LEFT KNEE ARTHROPLASTY;  Surgeon: [name], MD;  Location: [facility];  Service: Orthopedics;  Laterality: Left;  . TOTAL KNEE ARTHROPLASTY Right 10/01/2015   Procedure: RIGHT TOTAL KNEE ARTHROPLASTY;  Surgeon: [name], MD;  Location: [facility];  Service: Orthopedics;  Laterality: Right;    Family Psychiatric History: Please see initial evaluation for full details. I have reviewed the history. No updates at this time.     Family History:  Family History  Problem Relation Age of Onset  . Cancer Mother   . Depression Father   . Cancer Brother   . Diabetes Brother   . Cancer Maternal Aunt   . Diabetes Maternal Aunt   . Bipolar disorder Son   . Drug abuse Son   . Heart attack Sister 72       during severe illness    Social  History:  Social History   Socioeconomic History  . Marital status: Widowed    Spouse name: [name]  . Number of children: 2  . Years of education: 12 +  . Highest education level: Not on file  Occupational History  . Occupation: disability, medical  Social Needs  . Financial resource strain: Not very hard  . Food insecurity:    Worry: Never true    Inability: Never true  . Transportation needs:    Medical: No    Non-medical: No  Tobacco Use  . Smoking status: Never Smoker  . Smokeless tobacco: Never Used  Substance and Sexual Activity  . Alcohol use: No    Alcohol/week: 0.0 standard drinks    Comment: 12-25-2015 per pt no  . Drug use: No    Comment: 21-12-2015 per pt no   . Sexual activity: Not Currently    Birth control/protection:  Surgical  Lifestyle  . Physical activity:    Days per week: 7 days    Minutes per session: 60 min  . Stress: Rather much  Relationships  . Social connections:    Talks on phone: More than three times a week    Gets together: More than three times a week    Attends religious service: More than 4 times per year    Active member of club or organization: No    Attends meetings of clubs or organizations: Not on file    Relationship status: Widowed  Other Topics Concern  . Not on file  Social History Narrative   Patient has two adult children.   Patient is disabled.   Patient has a high school education and some trade school.   Patient is right-handed.   Patient drinks four glasses of soda and tea daily.    Allergies:  Allergies  Allergen Reactions  . Acetaminophen Other (See Comments)    Has fatty deposits on liver  . Benadryl [Diphenhydramine Hcl] Other (See Comments)    hyperactivity and seizures  . Codeine Itching and Rash    seizures  . Dilantin [Phenytoin Sodium Extended] Other (See Comments)    Elevated LFT's  . Melatonin Other (See Comments)    seizures  . Tramadol Other (See Comments)    "causes seizures"  . Ultram [Tramadol Hcl] Other (See Comments)    Seizures  . Vimpat [Lacosamide] Other (See Comments)    Severe dizziness  . Mirtazapine Other (See Comments)    Potentiated seizure activity  . Betadine [Povidone Iodine] Rash  . Latex Rash  . Penicillins Itching and Rash    Has patient had a PCN reaction causing immediate rash, facial/tongue/throat swelling, SOB or lightheadedness with hypotension: No Has patient had a PCN reaction causing severe rash involving mucus membranes or skin necrosis: No Has patient had a PCN reaction that required hospitalization No Has patient had a PCN reaction occurring within the last 10 years: No If all of the above answers are "NO", then may proceed with Cephalosporin use.  . Sulfa Antibiotics Rash  . Tape Rash and Other (See  Comments)    Paper tape please Paper tape please  . Vicodin [Hydrocodone-Acetaminophen] Itching    Metabolic Disorder Labs: Lab Results  Component Value Date   HGBA1C 5.4 11/28/2015   MPG 108 11/28/2015   No results found for: PROLACTIN Lab Results  Component Value Date   CHOL 151 09/21/2017   TRIG 124.0 09/21/2017   HDL 53.10 09/21/2017   CHOLHDL 3 09/21/2017   VLDL 24.8 09/21/2017  [place] 73 09/21/2017   LDLCALC 86 07/07/2016   Lab Results  Component Value Date   TSH 1.52 09/21/2017   TSH 1.900 07/07/2016    Therapeutic Level Labs: No results found for: LITHIUM No results found for: VALPROATE No components found for:  CBMZ  Current Medications: Current Outpatient Medications  Medication Sig Dispense Refill  . FLUoxetine (PROZAC) 20 MG capsule Take 3 capsules (60 mg total) by mouth daily. 90 capsule 0  . gabapentin (NEURONTIN) 600 MG tablet Take 1 tablet (600 mg total) by mouth 3 (three) times daily. 270 tablet 3  . hydrOXYzine (ATARAX/VISTARIL) 25 MG tablet Take 1 tablet (25 mg total) by mouth 2 (two) times daily as needed for anxiety. 180 tablet 0  . lamoTRIgine (LAMICTAL) 150 MG tablet Take 1 tablet (150 mg total) by mouth 2 (two) times daily. For mood control 60 tablet 11  . levETIRAcetam (KEPPRA) 1000 MG tablet Take 1 in the morning and 1.5 tablet at bedtime. 75 tablet 5  . LORazepam (ATIVAN) 1 MG tablet Take 1 tablet by mouth 2 (two) times daily.    . pramipexole (MIRAPEX) 0.5 MG tablet TAKE 1 TABLET BY MOUTH TWICE DAILY 60 tablet 0   No current facility-administered medications for this visit.      Musculoskeletal: Strength & Muscle Tone: within normal limits Gait & Station: normal Patient leans: N/A  Psychiatric Specialty Exam: Review of Systems  Psychiatric/Behavioral: Negative for depression, hallucinations, memory loss, substance abuse and suicidal ideas. The patient is nervous/anxious and has insomnia.   All other systems reviewed and are  negative.   Blood pressure 116/77, pulse 84, height 1' (0.305 m), weight 164 lb (74.4 kg), SpO2 95 %.Body mass index is 800.73 kg/m.  General Appearance: Fairly Groomed  Eye Contact:  Good  Speech:  Clear and Coherent  Volume:  Normal  Mood:  "good"  Affect:  Appropriate, Congruent and Full Range  Thought Process:  Coherent  Orientation:  Full (Time, Place, and Person)  Thought Content: Logical   Suicidal Thoughts:  No  Homicidal Thoughts:  No  Memory:  Immediate;   Good  Judgement:  Good  Insight:  Fair  Psychomotor Activity:  Normal  Concentration:  Concentration: Good and Attention Span: Good  Recall:  Good  Fund of Knowledge: Good  Language: Good  Akathisia:  No  Handed:  Right  AIMS (if indicated): not done  Assets:  Communication Skills Desire for Improvement  ADL's:  Intact  Cognition: WNL  Sleep:  Poor   Screenings: AIMS     ED to Hosp-Admission (Discharged) from 10/30/2017 in [facility]  AIMS Total Score  0    AUDIT     ED to Hosp-Admission (Discharged) from 10/30/2017 in [facility]  Alcohol Use Disorder Identification Test Final Score (AUDIT)  0    GAD-7     Office Visit from 01/31/2015 in [facility]  Total GAD-7 Score  15    Mini-Mental     Office Visit from 02/20/2014 in [facility]  Total Score (max 30 points )  30    PHQ2-9     Patient Outreach from 08/31/2017 in [facility] Office Visit from 04/17/2017 in [facility] Office Visit from 04/13/2017 in [facility] Office Visit from 02/03/2017 in [facility] Visit from 01/30/2017 in [facility]  PHQ-2 Total Score  2  1  2  2  0  PHQ-9 Total Score  14  -  17  13  -       Assessment and Plan:  AKEMI OVERHOLSER is a 53 y.o. year old female with a history of depression, pseudoseizure, partial  symptomatic epilepsy with complex partial seizures, osteoarthritis, congenital hip dislocation s/p replacement, who presents for follow up appointment for MDD (major depressive disorder), recurrent episode, mild ([place])  # MDD, mild, recurrent without psychotic features There has been significant improvement in depressive symptoms, which coincided with the patient communicating with a man.  Will continue fluoxetine to target depression.  Will continue hydroxyzine as needed for anxiety.  Will continue Ativan as needed for anxiety.  Discussed risk of dependence and oversedation.  Discussed behavioral activation.   Plan I have reviewed and updated plans as below 1. Continue fluoxetine 60 mg daily  3. Continue hydroxyzine 25 mg twice a day as needed for anxiety 4.Continue ativan0.5mg dailyas needed for anxiety (patient declined refill) 5.Return to clinic inthree months for 15 mins - on gabapentin, Lamictal, Keppra for seizure.  - on pramipexole for restless leg  Past trials of medication: fluoxetine,sertraline (increased appetite),citalopram, Trazodone, lorazepam, clonazepam   The patient demonstrates the following risk factors for suicide: Chronic risk factors for suicide include: psychiatric disorder of depressionand previous suicide attempts of overdosing medication. Acute risk factorsfor suicide include: unemployment and her husband with terminal illness. Protective factorsfor this patient include: positive social support, coping skills and hope for the future. Considering these factors, the overall suicide risk at this point appears to be low. Patient isappropriate for outpatient follow up.  Norman Clay, MD 03/11/2018, 9:42 AM

## 2018-03-09 ENCOUNTER — Encounter: Payer: Self-pay | Admitting: Internal Medicine

## 2018-03-11 ENCOUNTER — Ambulatory Visit (INDEPENDENT_AMBULATORY_CARE_PROVIDER_SITE_OTHER): Payer: PPO | Admitting: Psychiatry

## 2018-03-11 ENCOUNTER — Encounter (HOSPITAL_COMMUNITY): Payer: Self-pay | Admitting: Psychiatry

## 2018-03-11 VITALS — BP 116/77 | HR 84 | Ht <= 58 in | Wt 164.0 lb

## 2018-03-11 DIAGNOSIS — F330 Major depressive disorder, recurrent, mild: Secondary | ICD-10-CM | POA: Diagnosis not present

## 2018-03-11 MED ORDER — HYDROXYZINE HCL 25 MG PO TABS: 25.0000 mg | ORAL_TABLET | Freq: Two times a day (BID) | ORAL | 0 refills | Status: DC | PRN

## 2018-03-11 MED ORDER — FLUOXETINE HCL 20 MG PO CAPS: 60.0000 mg | ORAL_CAPSULE | Freq: Every day | ORAL | 0 refills | Status: DC

## 2018-03-11 NOTE — Patient Instructions (Signed)
1. Continue fluoxetine 60 mg daily  3. Continue hydroxyzine 25 mg twice a day as needed for anxiety 4.Continue ativan0.5mg dailyas needed for anxiety 5.Return to clinic inthree months for 15 mins

## 2018-03-18 ENCOUNTER — Other Ambulatory Visit (HOSPITAL_COMMUNITY): Payer: Self-pay | Admitting: Psychiatry

## 2018-03-18 MED ORDER — FLUOXETINE HCL 20 MG PO CAPS: 60.0000 mg | ORAL_CAPSULE | Freq: Every day | ORAL | 2 refills | Status: DC

## 2018-03-22 ENCOUNTER — Other Ambulatory Visit: Payer: Self-pay | Admitting: Neurology

## 2018-03-23 ENCOUNTER — Ambulatory Visit: Payer: Self-pay | Admitting: Family Medicine

## 2018-04-06 ENCOUNTER — Encounter (HOSPITAL_COMMUNITY): Payer: Self-pay | Admitting: Psychiatry

## 2018-04-06 ENCOUNTER — Ambulatory Visit (INDEPENDENT_AMBULATORY_CARE_PROVIDER_SITE_OTHER): Payer: PPO | Admitting: Family Medicine

## 2018-04-06 ENCOUNTER — Encounter: Payer: Self-pay | Admitting: Family Medicine

## 2018-04-06 ENCOUNTER — Ambulatory Visit: Payer: Self-pay | Admitting: Family Medicine

## 2018-04-06 ENCOUNTER — Other Ambulatory Visit: Payer: Self-pay

## 2018-04-06 VITALS — BP 102/64 | HR 74 | Temp 98.7°F | Resp 16 | Ht 60.0 in | Wt 164.4 lb

## 2018-04-06 DIAGNOSIS — H6983 Other specified disorders of Eustachian tube, bilateral: Secondary | ICD-10-CM

## 2018-04-06 MED ORDER — GABAPENTIN 600 MG PO TABS: 600.0000 mg | ORAL_TABLET | Freq: Three times a day (TID) | ORAL | 3 refills | Status: DC

## 2018-04-06 NOTE — Progress Notes (Signed)
Subjective  CC:  Chief Complaint  Patient presents with  . Ear Fullness    Started 1 week ago bilateral ears.. Full feeling and painful.. She denies any cough, fever, congestion or drainage.. She has tried cleaning out with peroxide     HPI: [name] is a 53 y.o. female who presents to the office today to address the problems listed above in the chief complaint.  Pt reports has had a week of fullness in her ears. No fevers or URI sxs. Denies PND. Has occ popping. No sinus pain. No headaches. Denies hearing problems.    Needs refill on gabapentin Assessment  1. ETD (Eustachian tube dysfunction), bilateral      Plan   ETD:  Educated and reassured. Start zyrtec. Valsalva maneuvers. See AVS.   Refilled gabapentin.   Follow up: f/u as scheduled.  05/14/2018  No orders of the defined types were placed in this encounter.  Meds ordered this encounter  Medications  . gabapentin (NEURONTIN) 600 MG tablet    Sig: Take 1 tablet (600 mg total) by mouth 3 (three) times daily.    Dispense:  270 tablet    Refill:  3      I reviewed the patients updated PMH, FH, and SocHx.    Patient Active Problem List   Diagnosis Date Noted  . 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 Convulsions ([place]) 02/22/2018  . MDD (major depressive disorder), recurrent episode, mild ([place]) 01/08/2018  . Widowed - July 2019 08/07/2017  . Sleep apnea 06/09/2017  . Colon polyp 06/05/2017  . Elevated liver function tests 06/05/2017  . Fatty liver   . Mild neurocognitive disorder 02/04/2017  . 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 Bereavement 12/25/2015  . Partial symptomatic epilepsy with complex partial seizures, not intractable, with status epilepticus ([place]) 07/24/2015  . Insomnia w/ sleep apnea 07/24/2015  . OA (osteoarthritis) of knee 05/07/2015  . 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[place]) 08/17/2013   Current Meds  Medication Sig  . gabapentin (NEURONTIN) 600 MG tablet Take 1 tablet (600 mg total) by mouth 3 (three) times daily.  .  hydrOXYzine (ATARAX/VISTARIL) 25 MG tablet Take 1 tablet (25 mg total) by mouth 2 (two) times daily as needed for anxiety.  . lamoTRIgine (LAMICTAL) 150 MG tablet Take 1 tablet (150 mg total) by mouth 2 (two) times daily. For mood control  . levETIRAcetam (KEPPRA) 1000 MG tablet Take 1 in the morning and 1.5 tablet at bedtime.  Marland Kitchen LORazepam (ATIVAN) 1 MG tablet Take 1 tablet by mouth 2 (two) times daily.  . pramipexole (MIRAPEX) 0.5 MG tablet Take 1 tablet by mouth twice daily  . [DISCONTINUED] FLUoxetine (PROZAC) 20 MG capsule Take 3 capsules (60 mg total) by mouth daily.  . [DISCONTINUED] gabapentin (NEURONTIN) 600 MG tablet Take 1 tablet (600 mg total) by mouth 3 (three) times daily.    Allergies: Patient is allergic to acetaminophen; benadryl [diphenhydramine hcl]; codeine; dilantin [phenytoin sodium extended]; melatonin; tramadol; ultram [tramadol hcl]; vimpat [lacosamide]; mirtazapine; betadine [povidone iodine]; latex; penicillins; sulfa antibiotics; tape; and vicodin [hydrocodone-acetaminophen]. Family History: Patient family history includes Bipolar disorder in her son; Cancer in her brother, maternal aunt, and mother; Depression in her father; Diabetes in her brother and maternal aunt; Drug abuse in her son; Heart attack (age of onset: 67) in her sister. Social History:  Patient  reports that she has never smoked. She has never used smokeless tobacco. She reports that she does not drink alcohol or use drugs.  Review of Systems: Constitutional: Negative for  fever malaise or anorexia Cardiovascular: negative for chest pain Respiratory: negative for SOB or persistent cough Gastrointestinal: negative for abdominal pain  Objective  Vitals: BP 102/64   Pulse 74   Temp 98.7 F (37.1 C) (Oral)   Resp 16   Ht 5' (1.524 m)   Wt 164 lb 6.4 oz (74.6 kg)   SpO2 97%   BMI 32.11 kg/m  General: no acute distress , A&Ox3 HEENT: PEERL, conjunctiva normal, Oropharynx moist,neck is supple, TMs  with effusions and left opaque tm w/o erythema or retraction. EACs clear Cardiovascular:  RRR without murmur or gallop.  Respiratory:  Good breath sounds bilaterally, CTAB with normal respiratory effort Skin:  Warm, no rashes     Commons side effects, risks, benefits, and alternatives for medications and treatment plan prescribed today were discussed, and the patient expressed understanding of the given instructions. Patient is instructed to call or message via MyChart if he/she has any questions or concerns regarding our treatment plan. No barriers to understanding were identified. We discussed Red Flag symptoms and signs in detail. Patient expressed understanding regarding what to do in case of urgent or emergency type symptoms.   Medication list was reconciled, printed and provided to the patient in AVS. Patient instructions and summary information was reviewed with the patient as documented in the AVS. This note was prepared with assistance of Dragon voice recognition software. Occasional wrong-word or sound-a-like substitutions may have occurred due to the inherent limitations of voice recognition software

## 2018-04-06 NOTE — Patient Instructions (Signed)
Please follow up if symptoms do not improve or as needed.   Start certirizine (generic Zyrtec) nightly to help.   Eustachian Tube Dysfunction  Eustachian tube dysfunction refers to a condition in which a blockage develops in the narrow passage that connects the middle ear to the back of the nose (eustachian tube). The eustachian tube regulates air pressure in the middle ear by letting air move between the ear and nose. It also helps to drain fluid from the middle ear space. Eustachian tube dysfunction can affect one or both ears. When the eustachian tube does not function properly, air pressure, fluid, or both can build up in the middle ear. What are the causes? This condition occurs when the eustachian tube becomes blocked or cannot open normally. Common causes of this condition include:  Ear infections.  Colds and other infections that affect the nose, mouth, and throat (upper respiratory tract).  Allergies.  Irritation from cigarette smoke.  Irritation from stomach acid coming up into the esophagus (gastroesophageal reflux). The esophagus is the tube that carries food from the mouth to the stomach.  Sudden changes in air pressure, such as from descending in an airplane or scuba diving.  Abnormal growths in the nose or throat, such as: ? Growths that line the nose (nasal polyps). ? Abnormal growth of cells (tumors). ? Enlarged tissue at the back of the throat (adenoids). What increases the risk? You are more likely to develop this condition if:  You smoke.  You are overweight.  You are a child who has: ? Certain birth defects of the mouth, such as cleft palate. ? Large tonsils or adenoids. What are the signs or symptoms? Common symptoms of this condition include:  A feeling of fullness in the ear.  Ear pain.  Clicking or popping noises in the ear.  Ringing in the ear.  Hearing loss.  Loss of balance.  Dizziness. Symptoms may get worse when the air pressure  around you changes, such as when you travel to an area of high elevation, fly on an airplane, or go scuba diving. How is this diagnosed? This condition may be diagnosed based on:  Your symptoms.  A physical exam of your ears, nose, and throat.  Tests, such as those that measure: ? The movement of your eardrum (tympanogram). ? Your hearing (audiometry). How is this treated? Treatment depends on the cause and severity of your condition.  In mild cases, you may relieve your symptoms by moving air into your ears. This is called "popping the ears."  In more severe cases, or if you have symptoms of fluid in your ears, treatment may include: ? Medicines to relieve congestion (decongestants). ? Medicines that treat allergies (antihistamines). ? Nasal sprays or ear drops that contain medicines that reduce swelling (steroids). ? A procedure to drain the fluid in your eardrum (myringotomy). In this procedure, a small tube is placed in the eardrum to:  Drain the fluid.  Restore the air in the middle ear space. ? A procedure to insert a balloon device through the nose to inflate the opening of the eustachian tube (balloon dilation). Follow these instructions at home: Lifestyle  Do not do any of the following until your health care provider approves: ? Travel to high altitudes. ? Fly in airplanes. ? Work in a Pension scheme manager or room. ? Scuba dive.  Do not use any products that contain nicotine or tobacco, such as cigarettes and e-cigarettes. If you need help quitting, ask your health care provider.  Keep your ears dry. Wear fitted earplugs during showering and bathing. Dry your ears completely after. General instructions  Take over-the-counter and prescription medicines only as told by your health care provider.  Use techniques to help pop your ears as recommended by your health care provider. These may include: ? Chewing gum. ? Yawning. ? Frequent, forceful swallowing. ? Closing  your mouth, holding your nose closed, and gently blowing as if you are trying to blow air out of your nose.  Keep all follow-up visits as told by your health care provider. This is important. Contact a health care provider if:  Your symptoms do not go away after treatment.  Your symptoms come back after treatment.  You are unable to pop your ears.  You have: ? A fever. ? Pain in your ear. ? Pain in your head or neck. ? Fluid draining from your ear.  Your hearing suddenly changes.  You become very dizzy.  You lose your balance. Summary  Eustachian tube dysfunction refers to a condition in which a blockage develops in the eustachian tube.  It can be caused by ear infections, allergies, inhaled irritants, or abnormal growths in the nose or throat.  Symptoms include ear pain, hearing loss, or ringing in the ears.  Mild cases are treated with maneuvers to unblock the ears, such as yawning or ear popping.  Severe cases are treated with medicines. Surgery may also be done (rare). This information is not intended to replace advice given to you by your health care provider. Make sure you discuss any questions you have with your health care provider. Document Released: 01/26/2015 Document Revised: 04/21/2017 Document Reviewed: 04/21/2017 Elsevier Interactive Patient Education  2019 [employer].

## 2018-04-08 ENCOUNTER — Other Ambulatory Visit (HOSPITAL_COMMUNITY): Payer: Self-pay | Admitting: Psychiatry

## 2018-04-08 ENCOUNTER — Telehealth (HOSPITAL_COMMUNITY): Payer: Self-pay | Admitting: *Deleted

## 2018-04-08 MED ORDER — LORAZEPAM 0.5 MG PO TABS: 0.5000 mg | ORAL_TABLET | Freq: Every day | ORAL | 1 refills | Status: DC | PRN

## 2018-04-08 NOTE — Telephone Encounter (Signed)
Ordered. Please let the patient know that it seems like she was given refill, which was ordered several months ago at the pharmacy. Instruction was to take 0.5 mg daily. I ordered refill for 0.5 mg daily. Will not plan to increase the dose at this time.

## 2018-04-08 NOTE — Telephone Encounter (Signed)
This encounter was created in error - please disregard.

## 2018-04-08 NOTE — Telephone Encounter (Signed)
Dr Modesta Messing Patient called requesting refill on Ativan 1 mg. Next Visit  06-09-2018

## 2018-04-15 ENCOUNTER — Telehealth: Payer: Self-pay | Admitting: Family Medicine

## 2018-04-15 NOTE — Telephone Encounter (Signed)
Pt left a vm stating she tried to get a refill of her gabapentin and was not able to. She is requesting someone to reach out to her pharmacy and contact her back.

## 2018-04-16 MED ORDER — GABAPENTIN 600 MG PO TABS: 600.0000 mg | ORAL_TABLET | Freq: Three times a day (TID) | ORAL | 3 refills | Status: DC

## 2018-04-16 NOTE — Telephone Encounter (Signed)
Please call [facility]; this medication was ordered on march 20th: 3 month supply with 3 refills. Can give verbal order if they don't have it. Thanks.

## 2018-04-16 NOTE — Telephone Encounter (Signed)
Spoke with [employer]. Insurance will not pay for refill until 04/29/18. [place] notifying patient.

## 2018-04-23 ENCOUNTER — Telehealth (HOSPITAL_COMMUNITY): Payer: Self-pay | Admitting: Psychiatry

## 2018-04-23 ENCOUNTER — Other Ambulatory Visit (HOSPITAL_COMMUNITY): Payer: Self-pay | Admitting: Psychiatry

## 2018-04-23 MED ORDER — LORAZEPAM 0.5 MG PO TABS: 0.5000 mg | ORAL_TABLET | Freq: Every day | ORAL | 0 refills | Status: DC | PRN

## 2018-04-23 MED ORDER — FLUOXETINE HCL 20 MG PO CAPS: 60.0000 mg | ORAL_CAPSULE | Freq: Every day | ORAL | 1 refills | Status: DC

## 2018-04-23 NOTE — Telephone Encounter (Signed)
Please inform the patient that I would highly recommend she stay on fluoxetine to avoid relapse in her mood symptoms. It is not uncommon that you may not notice any difference after discontinuing it as fluoxetine remains in the system for a while. Also remind her that I will not be able to do refill of lorazepam anymore if she were to discontinue fluoxetine.

## 2018-04-23 NOTE — Telephone Encounter (Signed)
Dr Alvino Chapel with patient & informed her on what you mentioned & she said " Oh " well then I  Would like refills on the  Lorazepam & fluoxetine. Requested for scripts to be sent to the [facility]

## 2018-04-23 NOTE — Telephone Encounter (Signed)
Ordered

## 2018-05-14 ENCOUNTER — Ambulatory Visit: Payer: PPO | Admitting: Family Medicine

## 2018-05-26 ENCOUNTER — Other Ambulatory Visit: Payer: Self-pay | Admitting: Family Medicine

## 2018-05-28 ENCOUNTER — Encounter: Payer: Self-pay | Admitting: Family Medicine

## 2018-05-28 ENCOUNTER — Other Ambulatory Visit: Payer: Self-pay

## 2018-05-28 ENCOUNTER — Ambulatory Visit (INDEPENDENT_AMBULATORY_CARE_PROVIDER_SITE_OTHER): Payer: PPO | Admitting: Family Medicine

## 2018-05-28 VITALS — Temp 99.0°F | Wt 159.0 lb

## 2018-05-28 DIAGNOSIS — G40201 Localization-related (focal) (partial) symptomatic epilepsy and epileptic syndromes with complex partial seizures, not intractable, with status epilepticus: Secondary | ICD-10-CM

## 2018-05-28 DIAGNOSIS — N3000 Acute cystitis without hematuria: Secondary | ICD-10-CM | POA: Diagnosis not present

## 2018-05-28 MED ORDER — CIPROFLOXACIN HCL 500 MG PO TABS: 500.0000 mg | ORAL_TABLET | Freq: Two times a day (BID) | ORAL | 0 refills | Status: AC

## 2018-05-28 MED ORDER — GABAPENTIN 600 MG PO TABS: 600.0000 mg | ORAL_TABLET | Freq: Three times a day (TID) | ORAL | 3 refills | Status: DC

## 2018-05-28 NOTE — Progress Notes (Signed)
I have discussed the procedure for the virtual visit with the patient who has given consent to proceed with assessment and treatment.   Tiara S Simmons, CMA     

## 2018-05-28 NOTE — Progress Notes (Signed)
TELEPHONE ENCOUNTER   Patient verbally agreed to telephone visit and is aware that copayment and coinsurance may apply. Patient was treated using telemedicine according to accepted telemedicine protocols.  Location of the patient: home Location of provider: [place], [place] office Names of all persons participating in the telemedicine service and role in the encounter: [name], MD , Lilli Light, CMA   Subjective  CC:  Chief Complaint  Patient presents with  . Urinary Tract Infection    Urinary frequency, pressure, and odor.. Symptoms started 2 days ago.. She has been drinking cranberry juice and AZO.Marland Kitchen Reports that cold and clammy   . Seizures    Needs refill on Gabapentin    HPI: [name] is a 53 y.o. female who was telephoned today to address the problems listed above in the chief complaint.  As above, classic uti sxs x 2 days but early upper tract sxs as well with mild flank pain and temp of 99 yesterday. No n/v/ or severe pain. Pt does not want to come to office due to covid 19 restrictions nor does she want to come to drop off urine sample. Eating and drinking well. Last UTI reported about a year ago.   Seizures: stable but says she is out of gabapentin and hasn't been able to get through to neurologist for refill. She is out x 3 days. No recent seizures. Chart review shows I refilled it in early April but she says she never got that RX.    ASSESSMENT: 1. Acute cystitis without hematuria   2. Partial symptomatic epilepsy with complex partial seizures, not intractable, with status epilepticus ([place])      cystitis:  Worrisome for early upper tract infection; treat empirically with cipro 500 bid x 7days. Educated on monitoring for worsening sxs including fever, flank pain, n/v/ or no improvement in 48 hours. She will see further care then if needed.   Seizure d/o: refilled gabapentin. Recommend f/u with neuro.  Time spent with the patient (non  face-to-face time during this virtual encounter): 12 minutes, spent in obtaining information about her symptoms, reviewing her previous labs, evaluations, and treatments, counseling her about her condition (please see the discussed topics above), and developing a plan to further investigate it; the patient was provided an opportunity to ask questions and all were answered. The patient agreed with the plan and demonstrated an understanding of the instructions.   The patient was advised to call back or seek an in-person evaluation if the symptoms worsen or if the condition fails to improve as anticipated.  Follow up: Return if symptoms worsen or fail to improve.  Visit date not found  No orders of the defined types were placed in this encounter.  Meds ordered this encounter  Medications  . gabapentin (NEURONTIN) 600 MG tablet    Sig: Take 1 tablet (600 mg total) by mouth 3 (three) times daily.    Dispense:  270 tablet    Refill:  3  . ciprofloxacin (CIPRO) 500 MG tablet    Sig: Take 1 tablet (500 mg total) by mouth 2 (two) times daily for 7 days.    Dispense:  14 tablet    Refill:  0     I reviewed the patients updated PMH, FH, and SocHx.    Patient Active Problem List   Diagnosis Date Noted  . MDD (major depressive disorder), recurrent episode, mild ([place]) 01/08/2018    Priority: High  . Sleep apnea 06/09/2017  Priority: High  . Mild neurocognitive disorder 02/04/2017    Priority: High  . Primary insomnia 05/19/2016    Priority: High  . Partial symptomatic epilepsy with complex partial seizures, not intractable, with status epilepticus ([place]) 07/24/2015    Priority: High  . 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 Colon polyp 06/05/2017    Priority: Medium  . Elevated liver function tests 06/05/2017    Priority: Medium  . Fatty liver     Priority: Medium  . OA (osteoarthritis) of knee 05/07/2015    Priority: Medium  . Widowed - July 2019 08/07/2017  .  Bereavement 12/25/2015   Current Meds  Medication Sig  . FLUoxetine (PROZAC) 20 MG capsule Take 3 capsules (60 mg total) by mouth daily.  Marland Kitchen gabapentin (NEURONTIN) 600 MG tablet Take 1 tablet (600 mg total) by mouth 3 (three) times daily.  . hydrOXYzine (ATARAX/VISTARIL) 25 MG tablet Take 1 tablet (25 mg total) by mouth 2 (two) times daily as needed for anxiety.  . lamoTRIgine (LAMICTAL) 150 MG tablet Take 1 tablet (150 mg total) by mouth 2 (two) times daily. For mood control  . levETIRAcetam (KEPPRA) 1000 MG tablet Take 1 in the morning and 1.5 tablet at bedtime.  Derrill Memo ON 06/07/2018] LORazepam (ATIVAN) 0.5 MG tablet Take 1 tablet (0.5 mg total) by mouth daily as needed for anxiety.  . pramipexole (MIRAPEX) 0.5 MG tablet Take 1 tablet by mouth twice daily  . [DISCONTINUED] gabapentin (NEURONTIN) 600 MG tablet Take 1 tablet (600 mg total) by mouth 3 (three) times daily.    Allergies: Patient is allergic to acetaminophen; benadryl [diphenhydramine hcl]; codeine; dilantin [phenytoin sodium extended]; melatonin; tramadol; ultram [tramadol hcl]; vimpat [lacosamide]; mirtazapine; betadine [povidone iodine]; latex; penicillins; sulfa antibiotics; tape; and vicodin [hydrocodone-acetaminophen]. Family History: Patient family history includes Bipolar disorder in her son; Cancer in her brother, maternal aunt, and mother; Depression in her father; Diabetes in her brother and maternal aunt; Drug abuse in her son; Heart attack (age of onset: 36) in her sister. Social History:  Patient  reports that she has never smoked. She has never used smokeless tobacco. She reports that she does not drink alcohol or use drugs.  Review of Systems: Constitutional: Negative for fever malaise or anorexia Cardiovascular: negative for chest pain Respiratory: negative for SOB or persistent cough Gastrointestinal: negative for abdominal pain

## 2018-06-01 ENCOUNTER — Telehealth: Payer: Self-pay | Admitting: Family Medicine

## 2018-06-01 NOTE — Telephone Encounter (Signed)
Please advise 

## 2018-06-01 NOTE — Telephone Encounter (Signed)
Copied from [place] [id number]. Topic: Quick Communication - Rx Refill/Question >> Jun 01, 2018 10:23 AM Reyne Dumas L wrote: Medication: gabapentin (NEURONTIN) 600 MG tablet   Has the patient contacted their pharmacy? Yes Kayleen Memos, NP with [facility] calling.  States that she did care management for this pt in the past.  States that pt can't get medication through pharmacy because they are telling her that it has been filled lately but at a different dose and that she will have to pay out of pocket.   Kayleen Memos is trying to make sure she gets her medication because of seizure hx and she has been out for a while. (Agent: If no, request that the patient contact the pharmacy for the refill.) (Agent: If yes, when and what did the pharmacy advise?)  Preferred Pharmacy (with phone number or street name):  [facility], [address] [phone] (Phone) [phone] (Fax)  Agent: Please be advised that RX refills may take up to 3 business days. We ask that you follow-up with your pharmacy.

## 2018-06-01 NOTE — Telephone Encounter (Signed)
Called pharmacy, they report state pt has not picked up RX for Gabapentin since Nov and they have recent RX on file.   Called [name] she is aware RX is at [facility] ready for her to pick up.

## 2018-06-01 NOTE — Telephone Encounter (Signed)
Please call the pharmacy to clarify. I have refilled it twice: once in April and once this week.

## 2018-06-03 NOTE — Progress Notes (Signed)
Virtual Visit via Telephone Note  I connected with [name] on 06/09/18 at 11:45 AM EDT by telephone and verified that I am speaking with the correct person using two identifiers.   I discussed the limitations, risks, security and privacy concerns of performing an evaluation and management service by telephone and the availability of in person appointments. I also discussed with the patient that there may be a patient responsible charge related to this service. The patient expressed understanding and agreed to proceed.    I discussed the assessment and treatment plan with the patient. The patient was provided an opportunity to ask questions and all were answered. The patient agreed with the plan and demonstrated an understanding of the instructions.   The patient was advised to call back or seek an in-person evaluation if the symptoms worsen or if the condition fails to improve as anticipated.  I provided 15 minutes of non-face-to-face time during this encounter.   Norman Clay, MD    [facility] MD/PA/NP OP Progress Note  06/09/2018 12:09 PM [name]  MRN:  [id number]  Chief Complaint:  Chief Complaint    Anxiety; Follow-up; Depression     HPI:  This is a follow-up visit for depression.  She states that she occasionally feels depressed.  She had an passive SI last week when she thinks about her husband who was deceased.  She feels that she did not have enough time to be together with him. She agrees that she did her best while she was with him. She does not contact with her female friend anymore. She has met somebody on facebook; he is homeless. She wonders that if she is trying to find someone to "replace" her husband. She agrees to think whether this relationship is healthy/helpful for the patient.  She sleeps well.  She has fair concentration.  She denies any current SI. She feels anxious, tense at times. She had a few panic attacks. She ran out of ativan for the past two weeks;  she believes her anxiety worsened. She had a fall, which she attributes to dizziness. She agrees to contact her PCP.   Visit Diagnosis:    ICD-10-CM   1. MDD (major depressive disorder), recurrent episode, mild ([place]) F33.0     Past Psychiatric History: Please see initial evaluation for full details. I have reviewed the history. No updates at this time.     Past Medical History:  Past Medical History:  Diagnosis Date  . Arthritis    "knees" (04/22/2012)  . Chronic bronchitis ([place])    "yearly; when the weather changes" (04/22/2012)  . Colon polyp   . Colon polyps    adenomatous and hyperplastic-  . Depression   . Eczema   . Epilepsy ([place])    "been having them right often here lately" (04/22/2012)  . Fatty liver   . High cholesterol   . History of kidney stones   . Osteoarthritis    [name] 04/22/2012  . Other convulsions 05/21/12   non-epileptic spells  . Restless leg   . Seizures ([place])   . Vertigo     Past Surgical History:  Procedure Laterality Date  . ABDOMINAL HYSTERECTOMY  2001  . BLADDER SUSPENSION    . BUNIONECTOMY Left 2000  . CESAREAN SECTION  1987; 1988  . COLONOSCOPY    . JOINT REPLACEMENT    . MASS EXCISION  10/22/2011   Procedure: EXCISION MASS;  Surgeon: [name], MD;  Location: [facility];  Service:  General;  Laterality: Right;  excision right buttock mass  . TOTAL HIP ARTHROPLASTY Left 1993; 1995; 2000  . TOTAL KNEE ARTHROPLASTY Left 05/07/2015   Procedure: TOTAL LEFT KNEE ARTHROPLASTY;  Surgeon: [name], MD;  [facility];  Service: Orthopedics;  Laterality: Left;  . TOTAL KNEE ARTHROPLASTY Right 10/01/2015   Procedure: RIGHT TOTAL KNEE ARTHROPLASTY;  Surgeon: [name], MD;  Location: [facility];  Service: Orthopedics;  Laterality: Right;    Family Psychiatric History: Please see initial evaluation for full details. I have reviewed the history. No updates at this time.     Family History:  Family History  Problem Relation Age of Onset   . Cancer Mother   . Depression Father   . Cancer Brother   . Diabetes Brother   . Cancer Maternal Aunt   . Diabetes Maternal Aunt   . Bipolar disorder Son   . Drug abuse Son   . Heart attack Sister 63       during severe illness    Social History:  Social History   Socioeconomic History  . Marital status: Widowed    Spouse name: [name]  . Number of children: 2  . Years of education: 12 +  . Highest education level: Not on file  Occupational History  . Occupation: disability, medical  Social Needs  . Financial resource strain: Not very hard  . Food insecurity:    Worry: Never true    Inability: Never true  . Transportation needs:    Medical: No    Non-medical: No  Tobacco Use  . Smoking status: Never Smoker  . Smokeless tobacco: Never Used  Substance and Sexual Activity  . Alcohol use: No    Alcohol/week: 0.0 standard drinks    Comment: 12-25-2015 per pt no  . Drug use: No    Comment: 21-12-2015 per pt no   . Sexual activity: Not Currently    Birth control/protection: Surgical  Lifestyle  . Physical activity:    Days per week: 7 days    Minutes per session: 60 min  . Stress: Rather much  Relationships  . Social connections:    Talks on phone: More than three times a week    Gets together: More than three times a week    Attends religious service: More than 4 times per year    Active member of club or organization: No    Attends meetings of clubs or organizations: Not on file    Relationship status: Widowed  Other Topics Concern  . Not on file  Social History Narrative   Patient has two adult children.   Patient is disabled.   Patient has a high school education and some trade school.   Patient is right-handed.   Patient drinks four glasses of soda and tea daily.    Allergies:  Allergies  Allergen Reactions  . Acetaminophen Other (See Comments)    Has fatty deposits on liver  . Benadryl [Diphenhydramine Hcl] Other (See Comments)    hyperactivity  and seizures  . Codeine Itching and Rash    seizures  . Dilantin [Phenytoin Sodium Extended] Other (See Comments)    Elevated LFT's  . Melatonin Other (See Comments)    seizures  . Tramadol Other (See Comments)    "causes seizures"  . Ultram [Tramadol Hcl] Other (See Comments)    Seizures  . Vimpat [Lacosamide] Other (See Comments)    Severe dizziness  . Mirtazapine Other (See Comments)    Potentiated seizure  activity  . Betadine [Povidone Iodine] Rash  . Latex Rash  . Penicillins Itching and Rash    Has patient had a PCN reaction causing immediate rash, facial/tongue/throat swelling, SOB or lightheadedness with hypotension: No Has patient had a PCN reaction causing severe rash involving mucus membranes or skin necrosis: No Has patient had a PCN reaction that required hospitalization No Has patient had a PCN reaction occurring within the last 10 years: No If all of the above answers are "NO", then may proceed with Cephalosporin use.  . Sulfa Antibiotics Rash  . Tape Rash and Other (See Comments)    Paper tape please Paper tape please  . Vicodin [Hydrocodone-Acetaminophen] Itching    Metabolic Disorder Labs: Lab Results  Component Value Date   HGBA1C 5.4 11/28/2015   MPG 108 11/28/2015   No results found for: PROLACTIN Lab Results  Component Value Date   CHOL 151 09/21/2017   TRIG 124.0 09/21/2017   HDL 53.10 09/21/2017   CHOLHDL 3 09/21/2017   VLDL 24.8 09/21/2017   LDLCALC 73 09/21/2017   LDLCALC 86 07/07/2016   Lab Results  Component Value Date   TSH 1.52 09/21/2017   TSH 1.900 07/07/2016    Therapeutic Level Labs: No results found for: LITHIUM No results found for: VALPROATE No components found for:  CBMZ  Current Medications: Current Outpatient Medications  Medication Sig Dispense Refill  . FLUoxetine (PROZAC) 20 MG capsule Take 3 capsules (60 mg total) by mouth daily. 90 capsule 1  . gabapentin (NEURONTIN) 600 MG tablet Take 1 tablet (600 mg total)  by mouth 3 (three) times daily. 270 tablet 3  . hydrOXYzine (ATARAX/VISTARIL) 25 MG tablet Take 1 tablet (25 mg total) by mouth 2 (two) times daily as needed for anxiety. 180 tablet 0  . lamoTRIgine (LAMICTAL) 150 MG tablet Take 1 tablet (150 mg total) by mouth 2 (two) times daily. For mood control 60 tablet 11  . levETIRAcetam (KEPPRA) 1000 MG tablet Take 1 in the morning and 1.5 tablet at bedtime. 75 tablet 5  . LORazepam (ATIVAN) 0.5 MG tablet Take 1 tablet (0.5 mg total) by mouth daily as needed for anxiety. 30 tablet 2  . pramipexole (MIRAPEX) 0.5 MG tablet Take 1 tablet by mouth twice daily 60 tablet 5   No current facility-administered medications for this visit.      Musculoskeletal: Strength & Muscle Tone: N/A Gait & Station: N/A Patient leans: N/A  Psychiatric Specialty Exam: Review of Systems  Psychiatric/Behavioral: Positive for depression. Negative for hallucinations, memory loss, substance abuse and suicidal ideas. The patient is nervous/anxious. The patient does not have insomnia.   All other systems reviewed and are negative.   There were no vitals taken for this visit.There is no height or weight on file to calculate BMI.  General Appearance: NA  Eye Contact:  NA  Speech:  Clear and Coherent  Volume:  Normal  Mood:  "fine till this morning"\  Affect:  NA  Thought Process:  Coherent  Orientation:  Full (Time, Place, and Person)  Thought Content: Logical   Suicidal Thoughts:  No  Homicidal Thoughts:  No  Memory:  Immediate;   Good  Judgement:  Good  Insight:  Fair  Psychomotor Activity:  Normal  Concentration:  Concentration: Good and Attention Span: Good  Recall:  Good  Fund of Knowledge: Good  Language: Good  Akathisia:  No  Handed:  Right  AIMS (if indicated): not done  Assets:  Communication Skills Desire for Improvement  ADL's:  Intact  Cognition: WNL  Sleep:  Good   Screenings: AIMS     ED to Hosp-Admission (Discharged) from 10/30/2017 in  [facility]  AIMS Total Score  0    AUDIT     ED to Hosp-Admission (Discharged) from 10/30/2017 in [facility]  Alcohol Use Disorder Identification Test Final Score (AUDIT)  0    GAD-7     Office Visit from 01/31/2015 in [facility]  Total GAD-7 Score  15    Mini-Mental     Office Visit from 02/20/2014 in [facility]  Total Score (max 30 points )  30    PHQ2-9     Patient Outreach from 08/31/2017 in [facility] Visit from 04/17/2017 in [facility] Office Visit from 04/13/2017 in [facility] Office Visit from 02/03/2017 in [facility] Visit from 01/30/2017 in [facility]  PHQ-2 Total Score  _0 0  PHQ-9 Total Score  14  -  17  13  -       Assessment and Plan:  SHAYLEY MEDLIN is a 53 y.o. year old female with a history of depression, pseudoseizure, partial symptomatic epilepsy with complex partial seizures, osteoarthritis, congenital hip dislocation s/p replacement, who presents for follow up appointment for MDD (major depressive disorder), recurrent episode, mild ([place])  # MDD, mild, recurrent without psychotic features Although patient reports overall improvement in depressive symptoms, she has some residual symptoms in relate to grief.  Will continue fluoxetine to target depression.  Will continue hydroxyzine as needed for anxiety.  Will continue Ativan as needed for anxiety.  Discussed risk of dependence and oversedation.  Validated her grief.  She will greatly benefit from supportive therapy; will make referral.   # Dizziness She had an episode of fall this morning. She has chronic dizziness; she is advised to contact PCP for further evaluation.    Plan I have reviewed and updated plans as below 1.Continue fluoxetine60 mg daily  3. Continue hydroxyzine  25 mg twice a day as needed for anxiety 4.Continue ativan0.65mdailyas needed for anxiety  5.Next appointment: 8/17 at 11 AM for 20 mins, phone - on gabapentin, Lamictal, Keppra for seizure.  - on pramipexole for restless leg  Past trials of medication: fluoxetine,sertraline (increased appetite),citalopram, Trazodone, lorazepam, clonazepam   The patient demonstrates the following risk factors for suicide: Chronic risk factors for suicide include: psychiatric disorder of depressionand previous suicide attempts of overdosing medication. Acute risk factorsfor suicide include: unemployment and her husband with terminal illness. Protective factorsfor this patient include: positive social support, coping skills and hope for the future. Considering these factors, the overall suicide risk at this point appears to be low. Patient isappropriate for outpatient follow up.  RNorman Clay MD 06/09/2018, 12:09 PM

## 2018-06-09 ENCOUNTER — Other Ambulatory Visit: Payer: Self-pay

## 2018-06-09 ENCOUNTER — Emergency Department (HOSPITAL_COMMUNITY)
Admission: EM | Admit: 2018-06-09 | Discharge: 2018-06-09 | Disposition: A | Discharge status: Home / Self Care | Payer: PPO | Attending: Emergency Medicine | Admitting: Emergency Medicine

## 2018-06-09 ENCOUNTER — Emergency Department (HOSPITAL_COMMUNITY): Payer: PPO

## 2018-06-09 ENCOUNTER — Ambulatory Visit (INDEPENDENT_AMBULATORY_CARE_PROVIDER_SITE_OTHER): Payer: PPO | Admitting: Psychiatry

## 2018-06-09 ENCOUNTER — Encounter (HOSPITAL_COMMUNITY): Payer: Self-pay | Admitting: Emergency Medicine

## 2018-06-09 ENCOUNTER — Encounter (HOSPITAL_COMMUNITY): Payer: Self-pay | Admitting: Psychiatry

## 2018-06-09 DIAGNOSIS — W19XXXA Unspecified fall, initial encounter: Secondary | ICD-10-CM | POA: Diagnosis not present

## 2018-06-09 DIAGNOSIS — Y999 Unspecified external cause status: Secondary | ICD-10-CM | POA: Diagnosis not present

## 2018-06-09 DIAGNOSIS — Y929 Unspecified place or not applicable: Secondary | ICD-10-CM | POA: Insufficient documentation

## 2018-06-09 DIAGNOSIS — G40909 Epilepsy, unspecified, not intractable, without status epilepticus: Secondary | ICD-10-CM | POA: Diagnosis not present

## 2018-06-09 DIAGNOSIS — S098XXA Other specified injuries of head, initial encounter: Secondary | ICD-10-CM | POA: Diagnosis not present

## 2018-06-09 DIAGNOSIS — X58XXXA Exposure to other specified factors, initial encounter: Secondary | ICD-10-CM | POA: Insufficient documentation

## 2018-06-09 DIAGNOSIS — M542 Cervicalgia: Secondary | ICD-10-CM | POA: Diagnosis not present

## 2018-06-09 DIAGNOSIS — F330 Major depressive disorder, recurrent, mild: Secondary | ICD-10-CM | POA: Diagnosis not present

## 2018-06-09 DIAGNOSIS — S0990XA Unspecified injury of head, initial encounter: Secondary | ICD-10-CM | POA: Diagnosis not present

## 2018-06-09 DIAGNOSIS — R569 Unspecified convulsions: Secondary | ICD-10-CM | POA: Diagnosis not present

## 2018-06-09 DIAGNOSIS — S14109A Unspecified injury at unspecified level of cervical spinal cord, initial encounter: Secondary | ICD-10-CM | POA: Diagnosis not present

## 2018-06-09 DIAGNOSIS — Y939 Activity, unspecified: Secondary | ICD-10-CM | POA: Diagnosis not present

## 2018-06-09 DIAGNOSIS — Z79899 Other long term (current) drug therapy: Secondary | ICD-10-CM | POA: Insufficient documentation

## 2018-06-09 MED ORDER — LORAZEPAM 0.5 MG PO TABS: 0.5000 mg | ORAL_TABLET | Freq: Every day | ORAL | 2 refills | Status: DC | PRN

## 2018-06-09 MED ORDER — HYDROXYZINE HCL 25 MG PO TABS: 25.0000 mg | ORAL_TABLET | Freq: Two times a day (BID) | ORAL | 0 refills | Status: DC | PRN

## 2018-06-09 NOTE — ED Provider Notes (Signed)
[facility] EMERGENCY DEPARTMENT Provider Note   CSN: [id number] Arrival date & time: 06/09/18  1713    History   Chief Complaint Chief Complaint  Patient presents with   Fall    HPI ALLONA GONDEK is a 53 y.o. female.     S/p fall this morning striking the left occipital area.  Questionable seizure, but no call to EMS.  Family made patient come to the ED.  She is taking Keppra as prescribed.  No neurological deficits in the ED.  Severity of symptoms is mild to moderate.     Past Medical History:  Diagnosis Date   Arthritis    "knees" (04/22/2012)   Chronic bronchitis ([place])    "yearly; when the weather changes" (04/22/2012)   Colon polyp    Colon polyps    adenomatous and hyperplastic-   Depression    Eczema    Epilepsy ([place])    "been having them right often here lately" (04/22/2012)   Fatty liver    High cholesterol    History of kidney stones    Osteoarthritis    /notes 04/22/2012   Other convulsions 05/21/12   non-epileptic spells   Restless leg    Seizures ([place])    Vertigo     Patient Active Problem List   Diagnosis Date Noted   MDD (major depressive disorder), recurrent episode, mild ([place]) 01/08/2018   Widowed - July 2019 08/07/2017   Sleep apnea 06/09/2017   Colon polyp 06/05/2017   Elevated liver function tests 06/05/2017   Fatty liver    Mild neurocognitive disorder 02/04/2017   Primary insomnia 05/19/2016   Bereavement 12/25/2015   Partial symptomatic epilepsy with complex partial seizures, not intractable, with status epilepticus ([place]) 07/24/2015   OA (osteoarthritis) of knee 05/07/2015   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[place]) 08/17/2013    Past Surgical History:  Procedure Laterality Date   ABDOMINAL HYSTERECTOMY  2001   BLADDER SUSPENSION     BUNIONECTOMY Left 2000   [place]; 1988   COLONOSCOPY     JOINT REPLACEMENT     MASS EXCISION  10/22/2011   Procedure: EXCISION MASS;  Surgeon: [name], MD;   Location: [facility];  Service: General;  Laterality: Right;  excision right buttock mass   TOTAL HIP ARTHROPLASTY Left 1993; 1995; 2000   TOTAL KNEE ARTHROPLASTY Left 05/07/2015   Procedure: TOTAL LEFT KNEE ARTHROPLASTY;  Surgeon: [name], MD;  [facility];  Service: Orthopedics;  Laterality: Left;   TOTAL KNEE ARTHROPLASTY Right 10/01/2015   Procedure: RIGHT TOTAL KNEE ARTHROPLASTY;  Surgeon: [name], MD;  [facility];  Service: Orthopedics;  Laterality: Right;     OB History   No obstetric history on file.      Home Medications    Prior to Admission medications   Medication Sig Start Date End Date Taking? Authorizing Provider  FLUoxetine (PROZAC) 20 MG capsule Take 3 capsules (60 mg total) by mouth daily. 04/23/18   Norman Clay, MD  gabapentin (NEURONTIN) 600 MG tablet Take 1 tablet (600 mg total) by mouth 3 (three) times daily. 05/28/18   [name], MD  hydrOXYzine (ATARAX/VISTARIL) 25 MG tablet Take 1 tablet (25 mg total) by mouth 2 (two) times daily as needed for anxiety. 06/09/18   Norman Clay, MD  lamoTRIgine (LAMICTAL) 150 MG tablet Take 1 tablet (150 mg total) by mouth 2 (two) times daily. For mood control 02/22/18   Dohmeier, Asencion Partridge, MD  levETIRAcetam (KEPPRA) 1000 MG tablet  Take 1 in the morning and 1.5 tablet at bedtime. 02/22/18   Dohmeier, Asencion Partridge, MD  LORazepam (ATIVAN) 0.5 MG tablet Take 1 tablet (0.5 mg total) by mouth daily as needed for anxiety. 06/09/18   Norman Clay, MD  pramipexole (MIRAPEX) 0.5 MG tablet Take 1 tablet by mouth twice daily 03/22/18   Dohmeier, Asencion Partridge, MD    Family History Family History  Problem Relation Age of Onset   Cancer Mother    Depression Father    Cancer Brother    Diabetes Brother    Cancer Maternal Aunt    Diabetes Maternal Aunt    Bipolar disorder Son    Drug abuse Son    Heart attack Sister 27       during severe illness    Social History Social History   Tobacco Use   Smoking status:  Never Smoker   Smokeless tobacco: Never Used  Substance Use Topics   Alcohol use: No    Alcohol/week: 0.0 standard drinks    Comment: 12-25-2015 per pt no   Drug use: No    Comment: 21-12-2015 per pt no      Allergies   Acetaminophen; Benadryl [diphenhydramine hcl]; Codeine; Dilantin [phenytoin sodium extended]; Melatonin; Tramadol; Ultram [tramadol hcl]; Vimpat [lacosamide]; Mirtazapine; Betadine [povidone iodine]; Latex; Penicillins; Sulfa antibiotics; Tape; and Vicodin [hydrocodone-acetaminophen]   Review of Systems Review of Systems  All other systems reviewed and are negative.    Physical Exam Updated Vital Signs BP 121/82    Pulse (!) 54    Temp 97.8 F (36.6 C) (Oral)    Resp 16    Ht 5\' 1"  (1.549 m)    Wt 72.6 kg    SpO2 96%    BMI 30.23 kg/m   Physical Exam Vitals signs and nursing note reviewed.  Constitutional:      Appearance: She is well-developed.  HENT:     Head: Normocephalic.     Comments: Tender left occipital area Eyes:     Conjunctiva/sclera: Conjunctivae normal.  Neck:     Comments: Tender posterior neck Cardiovascular:     Rate and Rhythm: Normal rate and regular rhythm.  Pulmonary:     Effort: Pulmonary effort is normal.     Breath sounds: Normal breath sounds.  Abdominal:     General: Bowel sounds are normal.     Palpations: Abdomen is soft.  Musculoskeletal: Normal range of motion.  Skin:    General: Skin is warm and dry.  Neurological:     Mental Status: She is alert and oriented to person, place, and time.  Psychiatric:        Behavior: Behavior normal.      ED Treatments / Results  Labs (all labs ordered are listed, but only abnormal results are displayed) Labs Reviewed - No data to display  EKG None  Radiology Ct Head Wo Contrast  Result Date: 06/09/2018 CLINICAL DATA:  Fall with possible seizure EXAM: CT HEAD WITHOUT CONTRAST CT CERVICAL SPINE WITHOUT CONTRAST TECHNIQUE: Multidetector CT imaging of the head and  cervical spine was performed following the standard protocol without intravenous contrast. Multiplanar CT image reconstructions of the cervical spine were also generated. COMPARISON:  10/26/2017 FINDINGS: CT HEAD FINDINGS Brain: No evidence of acute infarction, hemorrhage, hydrocephalus, extra-axial collection or mass lesion/mass effect. Mild age related volume loss is noted. Vascular: No hyperdense vessel or unexpected calcification. Skull: Normal. Negative for fracture or focal lesion. There is mild posterior scalp swelling without evidence of a calvarial defect.  Sinuses/Orbits: No acute finding. Other: None. CT CERVICAL SPINE FINDINGS Alignment: There is reversal of the normal cervical lordotic curvature which may be secondary to patient positioning, muscle spasm, or degenerative changes. Skull base and vertebrae: No acute fracture. No primary bone lesion or focal pathologic process. Soft tissues and spinal canal: No prevertebral fluid or swelling. No visible canal hematoma. Disc levels:  There is mild multilevel disc height loss. Upper chest: Negative. Other: None IMPRESSION: 1. No acute intracranial abnormality detected. 2. Posterior scalp swelling without evidence of a calvarial defect. 3. No displaced cervical spine fracture. Electronically Signed   By: Constance Holster M.D.   On: 06/09/2018 18:56   Ct Cervical Spine Wo Contrast  Result Date: 06/09/2018 CLINICAL DATA:  Fall with possible seizure EXAM: CT HEAD WITHOUT CONTRAST CT CERVICAL SPINE WITHOUT CONTRAST TECHNIQUE: Multidetector CT imaging of the head and cervical spine was performed following the standard protocol without intravenous contrast. Multiplanar CT image reconstructions of the cervical spine were also generated. COMPARISON:  10/26/2017 FINDINGS: CT HEAD FINDINGS Brain: No evidence of acute infarction, hemorrhage, hydrocephalus, extra-axial collection or mass lesion/mass effect. Mild age related volume loss is noted. Vascular: No  hyperdense vessel or unexpected calcification. Skull: Normal. Negative for fracture or focal lesion. There is mild posterior scalp swelling without evidence of a calvarial defect. Sinuses/Orbits: No acute finding. Other: None. CT CERVICAL SPINE FINDINGS Alignment: There is reversal of the normal cervical lordotic curvature which may be secondary to patient positioning, muscle spasm, or degenerative changes. Skull base and vertebrae: No acute fracture. No primary bone lesion or focal pathologic process. Soft tissues and spinal canal: No prevertebral fluid or swelling. No visible canal hematoma. Disc levels:  There is mild multilevel disc height loss. Upper chest: Negative. Other: None IMPRESSION: 1. No acute intracranial abnormality detected. 2. Posterior scalp swelling without evidence of a calvarial defect. 3. No displaced cervical spine fracture. Electronically Signed   By: Constance Holster M.D.   On: 06/09/2018 18:56    Procedures Procedures (including critical care time)  Medications Ordered in ED Medications - No data to display   Initial Impression / Assessment and Plan / ED Course  I have reviewed the triage vital signs and the nursing notes.  Pertinent labs & imaging results that were available during my care of the patient were reviewed by me and considered in my medical decision making (see chart for details).        Status post fall this morning after questionable seizure.  No neuro deficits noted.  CT head and CT cervical spine negative.  Final Clinical Impressions(s) / ED Diagnoses   Final diagnoses:  Fall, initial encounter  Minor head injury, initial encounter  Neck pain    ED Discharge Orders    None       Nat Christen, MD 06/09/18 [phone]

## 2018-06-09 NOTE — ED Triage Notes (Addendum)
Per EMS, pt from home. Pt had a fall and a possible seizure this morning. Pt's family told EMS that she had a seizure, but did not call EMS. They just helped patient back into bed. Pt AOx4 at this time. Pt c/o LT hip pain, but was able to ambulate to bed from wheelchair. Pt states she takes Keppra and has been taking it as prescribed.

## 2018-06-09 NOTE — Discharge Instructions (Addendum)
X-rays were normal.  You will be sore for several days.  Ice pack.  Tylenol.

## 2018-06-09 NOTE — ED Notes (Signed)
Patient transported to CT 

## 2018-06-09 NOTE — Patient Instructions (Signed)
1.Continue fluoxetine60 mg daily  3. Continue hydroxyzine 25 mg twice a day as needed for anxiety 4.Continue ativan0.5mg dailyas needed for anxiety  5.Next appointment: 8/17 at 11 AM

## 2018-06-15 ENCOUNTER — Other Ambulatory Visit: Payer: Self-pay

## 2018-06-15 ENCOUNTER — Emergency Department (HOSPITAL_COMMUNITY)
Admission: EM | Admit: 2018-06-15 | Discharge: 2018-06-16 | Disposition: A | Discharge status: Home / Self Care | Payer: PPO | Attending: Emergency Medicine | Admitting: Emergency Medicine

## 2018-06-15 ENCOUNTER — Emergency Department (HOSPITAL_COMMUNITY): Payer: PPO

## 2018-06-15 ENCOUNTER — Encounter (HOSPITAL_COMMUNITY): Payer: Self-pay | Admitting: Emergency Medicine

## 2018-06-15 DIAGNOSIS — F329 Major depressive disorder, single episode, unspecified: Secondary | ICD-10-CM | POA: Insufficient documentation

## 2018-06-15 DIAGNOSIS — R42 Dizziness and giddiness: Secondary | ICD-10-CM | POA: Diagnosis not present

## 2018-06-15 DIAGNOSIS — Z79899 Other long term (current) drug therapy: Secondary | ICD-10-CM | POA: Diagnosis not present

## 2018-06-15 DIAGNOSIS — R569 Unspecified convulsions: Secondary | ICD-10-CM | POA: Diagnosis not present

## 2018-06-15 DIAGNOSIS — Z96652 Presence of left artificial knee joint: Secondary | ICD-10-CM | POA: Diagnosis not present

## 2018-06-15 DIAGNOSIS — Z20828 Contact with and (suspected) exposure to other viral communicable diseases: Secondary | ICD-10-CM | POA: Diagnosis not present

## 2018-06-15 DIAGNOSIS — S50812A Abrasion of left forearm, initial encounter: Secondary | ICD-10-CM | POA: Insufficient documentation

## 2018-06-15 DIAGNOSIS — Z03818 Encounter for observation for suspected exposure to other biological agents ruled out: Secondary | ICD-10-CM | POA: Diagnosis not present

## 2018-06-15 DIAGNOSIS — Y9389 Activity, other specified: Secondary | ICD-10-CM | POA: Diagnosis not present

## 2018-06-15 DIAGNOSIS — Y998 Other external cause status: Secondary | ICD-10-CM | POA: Diagnosis not present

## 2018-06-15 DIAGNOSIS — R52 Pain, unspecified: Secondary | ICD-10-CM | POA: Diagnosis not present

## 2018-06-15 DIAGNOSIS — Z96651 Presence of right artificial knee joint: Secondary | ICD-10-CM | POA: Diagnosis not present

## 2018-06-15 DIAGNOSIS — R0902 Hypoxemia: Secondary | ICD-10-CM | POA: Diagnosis not present

## 2018-06-15 DIAGNOSIS — G40901 Epilepsy, unspecified, not intractable, with status epilepticus: Secondary | ICD-10-CM | POA: Diagnosis not present

## 2018-06-15 DIAGNOSIS — R51 Headache: Secondary | ICD-10-CM | POA: Insufficient documentation

## 2018-06-15 DIAGNOSIS — R531 Weakness: Secondary | ICD-10-CM | POA: Diagnosis not present

## 2018-06-15 DIAGNOSIS — F32A Depression, unspecified: Secondary | ICD-10-CM

## 2018-06-15 DIAGNOSIS — Y929 Unspecified place or not applicable: Secondary | ICD-10-CM | POA: Diagnosis not present

## 2018-06-15 DIAGNOSIS — W01198A Fall on same level from slipping, tripping and stumbling with subsequent striking against other object, initial encounter: Secondary | ICD-10-CM | POA: Insufficient documentation

## 2018-06-15 DIAGNOSIS — F330 Major depressive disorder, recurrent, mild: Secondary | ICD-10-CM | POA: Diagnosis not present

## 2018-06-15 DIAGNOSIS — S59912A Unspecified injury of left forearm, initial encounter: Secondary | ICD-10-CM | POA: Diagnosis not present

## 2018-06-15 LAB — COMPREHENSIVE METABOLIC PANEL
ALT: 127 U/L — ABNORMAL HIGH (ref 0–44)
AST: 80 U/L — ABNORMAL HIGH (ref 15–41)
Albumin: 3.5 g/dL (ref 3.5–5.0)
Alkaline Phosphatase: 52 U/L (ref 38–126)
Anion gap: 10 (ref 5–15)
BUN: 11 mg/dL (ref 6–20)
CO2: 24 mmol/L (ref 22–32)
Calcium: 8.4 mg/dL — ABNORMAL LOW (ref 8.9–10.3)
Chloride: 108 mmol/L (ref 98–111)
Creatinine, Ser: 0.55 mg/dL (ref 0.44–1.00)
GFR calc Af Amer: >60 mL/min (ref >60)
GFR calc non Af Amer: >60 mL/min (ref >60)
Glucose, Bld: 103 mg/dL — ABNORMAL HIGH (ref 70–99)
Potassium: 3.4 mmol/L — ABNORMAL LOW (ref 3.5–5.1)
Sodium: 142 mmol/L (ref 135–145)
Total Bilirubin: 0.5 mg/dL (ref 0.3–1.2)
Total Protein: 6.2 g/dL — ABNORMAL LOW (ref 6.5–8.1)

## 2018-06-15 LAB — CBC WITH DIFFERENTIAL/PLATELET
Abs Immature Granulocytes: 0.01 10*3/uL (ref 0.00–0.07)
Basophils Absolute: 0 10*3/uL (ref 0.0–0.1)
Basophils Relative: 0 %
Eosinophils Absolute: 0 10*3/uL (ref 0.0–0.5)
Eosinophils Relative: 0 %
HCT: 34 % — ABNORMAL LOW (ref 36.0–46.0)
Hemoglobin: 10.9 g/dL — ABNORMAL LOW (ref 12.0–15.0)
Immature Granulocytes: 0 %
Lymphocytes Relative: 29 %
Lymphs Abs: 1.7 10*3/uL (ref 0.7–4.0)
MCH: 29.6 pg (ref 26.0–34.0)
MCHC: 32.1 g/dL (ref 30.0–36.0)
MCV: 92.4 fL (ref 80.0–100.0)
Monocytes Absolute: 0.6 10*3/uL (ref 0.1–1.0)
Monocytes Relative: 10 %
Neutro Abs: 3.5 10*3/uL (ref 1.7–7.7)
Neutrophils Relative %: 61 %
Platelets: 176 10*3/uL (ref 150–400)
RBC: 3.68 MIL/uL — ABNORMAL LOW (ref 3.87–5.11)
RDW: 13.8 % (ref 11.5–15.5)
WBC: 5.8 10*3/uL (ref 4.0–10.5)
nRBC: 0 % (ref 0.0–0.2)

## 2018-06-15 LAB — ETHANOL: Alcohol, Ethyl (B): <10 mg/dL (ref <10)

## 2018-06-15 LAB — SALICYLATE LEVEL: Salicylate Lvl: <7 mg/dL (ref 2.8–30.0)

## 2018-06-15 LAB — CBG MONITORING, ED: Glucose-Capillary: 86 mg/dL (ref 70–99)

## 2018-06-15 LAB — ACETAMINOPHEN LEVEL: Acetaminophen (Tylenol), Serum: <10 ug/mL — ABNORMAL LOW (ref 10–30)

## 2018-06-15 MED ORDER — GABAPENTIN 300 MG PO CAPS
600.0000 mg | ORAL_CAPSULE | Freq: Three times a day (TID) | ORAL | Status: DC
  Administered 2018-06-15 – 2018-06-16 (×3): 600 mg via ORAL
  Filled 2018-06-15 (×3): qty 2

## 2018-06-15 MED ORDER — POTASSIUM CHLORIDE CRYS ER 20 MEQ PO TBCR
40.0000 meq | EXTENDED_RELEASE_TABLET | Freq: Once | ORAL | Status: AC
  Administered 2018-06-15: 40 meq via ORAL
  Filled 2018-06-15: qty 2

## 2018-06-15 MED ORDER — LEVETIRACETAM 500 MG PO TABS
1500.0000 mg | ORAL_TABLET | Freq: Every day | ORAL | Status: DC
  Administered 2018-06-15: 1500 mg via ORAL
  Filled 2018-06-15: qty 3

## 2018-06-15 MED ORDER — SODIUM CHLORIDE 0.9 % IV BOLUS
1000.0000 mL | Freq: Once | INTRAVENOUS | Status: AC
  Administered 2018-06-15: 1000 mL via INTRAVENOUS

## 2018-06-15 MED ORDER — LEVETIRACETAM 500 MG PO TABS
1000.0000 mg | ORAL_TABLET | Freq: Every day | ORAL | Status: DC
  Administered 2018-06-16: 1000 mg via ORAL
  Filled 2018-06-15: qty 2

## 2018-06-15 MED ORDER — LAMOTRIGINE 25 MG PO TABS
150.0000 mg | ORAL_TABLET | Freq: Two times a day (BID) | ORAL | Status: DC
  Administered 2018-06-15 – 2018-06-16 (×2): 150 mg via ORAL
  Filled 2018-06-15 (×2): qty 6

## 2018-06-15 MED ORDER — LORAZEPAM 0.5 MG PO TABS: 0.5000 mg | ORAL_TABLET | Freq: Every day | ORAL | Status: DC | PRN
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
## 2018-06-15 NOTE — Patient Outreach (Signed)
[facility]) Care Management  06/15/2018  [name] 11/15/1965 [id number]   Telephone Screen  Referral Date: 06/15/2018 Referral Source: [facility] [facility]. Referral Reason: "tub slippery and sometimes falls,no shower seat, lives alone, crying spurts, grouchy and moody-taking Prozac and Lamictal-but doesn't feel they are effective anymore" Insurance: [employer]   Outreach attempt # 1 to patient. No answer at present. RN CM left HIPAA compliant voicemail message along with contact info.      Plan: RN CM will make outreach attempt to patient within 3-4 business days. RN CM will send unsuccessful outreach letter to patient.   Enzo Montgomery, RN,BSN,CCM [facility] Telephonic Care Management Coordinator Direct Phone: [phone] Toll Free: [phone] Fax: [phone]

## 2018-06-15 NOTE — ED Triage Notes (Signed)
Pt states she has been dizzy for the past couple days in the mornings when she gets up. But after getting up she states she feels better. Pt states she has had a headache for a couple days now. Pt states she has not taken any kind of medication for her headache. Pt states her headache comes and goes. Pt states she has been stressed and depressed since her husband passed in July 2019. Pt states her medications are suppose to be changed and lowered dose (Prosac and Ativan) they changed the does 3 weeks ago.

## 2018-06-15 NOTE — ED Provider Notes (Signed)
[facility] EMERGENCY DEPARTMENT Provider Note   CSN: [id number] Arrival date & time: 06/15/18  2016    History   Chief Complaint Chief Complaint  Patient presents with  . Seizures    HPI [name] is a 53 y.o. female.     HPI  53 year old female presents after a seizure.  Witnessed by family.  She fell and hit her arm on a blush and has a left forearm abrasion.  She states that for the last couple days she has been having dizziness whenever she stands and feels weak.  She has had an on and off headache that is mild.  No severe headache.  No vomiting, diarrhea, chest pain.  She also feels depressed.  She feels like this is slowly worsening.  Is been ongoing for about a year since her husband died.  She has been on Prozac and Ativan.  She states her Ativan dose was reduced from 1 mg down to 0.5 mg about 3 weeks ago.  Is scheduled twice daily but the person that gives her meds sometimes does not give it to her. She does not feel actively suicidal. Sometimes she wishes she were dead so she'd be with her husband again. She has not taken her nightly meds tonight.  Past Medical History:  Diagnosis Date  . Arthritis    "knees" (04/22/2012)  . Chronic bronchitis ([place])    "yearly; when the weather changes" (04/22/2012)  . Colon polyp   . Colon polyps    adenomatous and hyperplastic-  . Depression   . Eczema   . Epilepsy ([place])    "been having them right often here lately" (04/22/2012)  . Fatty liver   . High cholesterol   . History of kidney stones   . Osteoarthritis    [name] 04/22/2012  . Other convulsions 05/21/12   non-epileptic spells  . Restless leg   . Seizures ([place])   . Vertigo     Patient Active Problem List   Diagnosis Date Noted  . MDD (major depressive disorder), recurrent episode, mild ([place]) 01/08/2018  . Widowed - July 2019 08/07/2017  . Sleep apnea 06/09/2017  . Colon polyp 06/05/2017  . Elevated liver function tests 06/05/2017  . Fatty liver   . Mild  neurocognitive disorder 02/04/2017  . 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 Bereavement 12/25/2015  . Partial symptomatic epilepsy with complex partial seizures, not intractable, with status epilepticus ([place]) 07/24/2015  . OA (osteoarthritis) of knee 05/07/2015  . 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 ABDOMINAL HYSTERECTOMY  2001  . BLADDER SUSPENSION    . BUNIONECTOMY Left 2000  . CESAREAN SECTION  1987; 1988  . COLONOSCOPY    . JOINT REPLACEMENT    . MASS EXCISION  10/22/2011   Procedure: EXCISION MASS;  Surgeon: [name], MD;  Location: [place];  Service: General;  Laterality: Right;  excision right buttock mass  . TOTAL HIP ARTHROPLASTY Left 1993; 1995; 2000  . TOTAL KNEE ARTHROPLASTY Left 05/07/2015   Procedure: TOTAL LEFT KNEE ARTHROPLASTY;  Surgeon: [name], MD;  [facility];  Service: Orthopedics;  Laterality: Left;  . TOTAL KNEE ARTHROPLASTY Right 10/01/2015   Procedure: RIGHT TOTAL KNEE ARTHROPLASTY;  Surgeon: [name], MD;  [facility];  Service: Orthopedics;  Laterality: Right;     OB History   No obstetric history on file.      Home Medications    Prior to Admission medications  Medication Sig Start Date End Date Taking? Authorizing Provider  FLUoxetine (PROZAC) 20 MG capsule Take 3 capsules (60 mg total) by mouth daily. 04/23/18   Norman Clay, MD  gabapentin (NEURONTIN) 600 MG tablet Take 1 tablet (600 mg total) by mouth 3 (three) times daily. 05/28/18   [name], MD  hydrOXYzine (ATARAX/VISTARIL) 25 MG tablet Take 1 tablet (25 mg total) by mouth 2 (two) times daily as needed for anxiety. 06/09/18   Norman Clay, MD  lamoTRIgine (LAMICTAL) 150 MG tablet Take 1 tablet (150 mg total) by mouth 2 (two) times daily. For mood control 02/22/18   Dohmeier, Asencion Partridge, MD  levETIRAcetam (KEPPRA) 1000 MG tablet Take 1 in the morning and 1.5 tablet at bedtime. 02/22/18   Dohmeier, Asencion Partridge, MD  LORazepam  (ATIVAN) 0.5 MG tablet Take 1 tablet (0.5 mg total) by mouth daily as needed for anxiety. 06/09/18   Norman Clay, MD  pramipexole (MIRAPEX) 0.5 MG tablet Take 1 tablet by mouth twice daily 03/22/18   Dohmeier, Asencion Partridge, MD    Family History Family History  Problem Relation Age of Onset  . Cancer Mother   . Depression Father   . Cancer Brother   . Diabetes Brother   . Cancer Maternal Aunt   . Diabetes Maternal Aunt   . Bipolar disorder Son   . Drug abuse Son   . Heart attack Sister 64       during severe illness    Social History Social History   Tobacco Use  . Smoking status: Never Smoker  . Smokeless tobacco: Never Used  Substance Use Topics  . Alcohol use: No    Alcohol/week: 0.0 standard drinks    Comment: 12-25-2015 per pt no  . Drug use: No    Comment: 21-12-2015 per pt no      Allergies   Acetaminophen; Benadryl [diphenhydramine hcl]; Codeine; Dilantin [phenytoin sodium extended]; Melatonin; Tramadol; Ultram [tramadol hcl]; Vimpat [lacosamide]; Mirtazapine; Betadine [povidone iodine]; Latex; Penicillins; Sulfa antibiotics; Tape; and Vicodin [hydrocodone-acetaminophen]   Review of Systems Review of Systems  Constitutional: Negative for fever.  Eyes: Positive for visual disturbance (chronic, unchanged).  Respiratory: Negative for shortness of breath.   Cardiovascular: Negative for chest pain.  Gastrointestinal: Negative for abdominal pain, diarrhea and vomiting.  Skin: Positive for wound (abrasion).  Neurological: Positive for dizziness, weakness (generalized), light-headedness and headaches (mild).  All other systems reviewed and are negative.    Physical Exam Updated Vital Signs BP 115/73   Pulse (!) 56   Resp 11   Ht 5\' 1"  (1.549 m)   Wt 72.6 kg   SpO2 98%   BMI 30.23 kg/m   Physical Exam Vitals signs and nursing note reviewed.  Constitutional:      General: She is not in acute distress.    Appearance: She is well-developed. She is not  ill-appearing.  HENT:     Head: Normocephalic and atraumatic.     Right Ear: External ear normal.     Left Ear: External ear normal.     Nose: Nose normal.  Eyes:     General:        Right eye: No discharge.        Left eye: No discharge.     Extraocular Movements: Extraocular movements intact.     Pupils: Pupils are equal, round, and reactive to light.  Cardiovascular:     Rate and Rhythm: Normal rate and regular rhythm.     Heart sounds: Normal heart sounds.  Pulmonary:     Effort: Pulmonary effort is normal.     Breath sounds: Normal breath sounds.  Abdominal:     Palpations: Abdomen is soft.     Tenderness: There is no abdominal tenderness.  Musculoskeletal:     Left forearm: She exhibits tenderness. She exhibits no deformity.       Arms:  Skin:    General: Skin is warm and dry.  Neurological:     Mental Status: She is alert.     Comments: CN 3-12 grossly intact. 5/5 strength in all 4 extremities. Grossly normal sensation. Normal finger to nose.   Psychiatric:        Mood and Affect: Mood is depressed. Mood is not anxious.      ED Treatments / Results  Labs (all labs ordered are listed, but only abnormal results are displayed) Labs Reviewed  CBC WITH DIFFERENTIAL/PLATELET - Abnormal; Notable for the following components:      Result Value   RBC 3.68 (*)    Hemoglobin 10.9 (*)    HCT 34.0 (*)    All other components within normal limits  COMPREHENSIVE METABOLIC PANEL - Abnormal; Notable for the following components:   Potassium 3.4 (*)    Glucose, Bld 103 (*)    Calcium 8.4 (*)    Total Protein 6.2 (*)    AST 80 (*)    ALT 127 (*)    All other components within normal limits  ACETAMINOPHEN LEVEL - Abnormal; Notable for the following components:   Acetaminophen (Tylenol), Serum <10 (*)    All other components within normal limits  ETHANOL  SALICYLATE LEVEL  RAPID URINE DRUG SCREEN, HOSP PERFORMED  URINALYSIS, ROUTINE W REFLEX MICROSCOPIC  CBG MONITORING,  ED    EKG EKG Interpretation  Date/Time:  Tuesday June 15 2018 20:23:19 EDT Ventricular Rate:  68 PR Interval:    QRS Duration: 102 QT Interval:  406 QTC Calculation: 432 R Axis:   76 Text Interpretation:  Sinus rhythm Minimal ST depression, inferior leads similar to Oct 2019 Confirmed by Sherwood Gambler [phone]) on 06/15/2018 9:07:46 PM   Radiology No results found.  Procedures Procedures (including critical care time)  Medications Ordered in ED Medications  gabapentin (NEURONTIN) capsule 600 mg (600 mg Oral Given 06/15/18 2158)  lamoTRIgine (LAMICTAL) tablet 150 mg (150 mg Oral Given 06/15/18 2159)  levETIRAcetam (KEPPRA) tablet 1,000 mg (has no administration in time range)  LORazepam (ATIVAN) tablet 0.5 mg (has no administration in time range)  levETIRAcetam (KEPPRA) tablet 1,500 mg (1,500 mg Oral Given 06/15/18 2159)  sodium chloride 0.9 % bolus 1,000 mL (0 mLs Intravenous Stopped 06/15/18 2220)     Initial Impression / Assessment and Plan / ED Course  I have reviewed the triage vital signs and the nursing notes.  Pertinent labs & imaging results that were available during my care of the patient were reviewed by me and considered in my medical decision making (see chart for details).        Patient had a brief episode of what was possibly a seizure though only lasted a few seconds and was not witnessed by myself.  She will be given her home seizure medicines.  Will replace her potassium.  X-ray of her forearm is pending though I have low suspicion for fracture.  TTS will be consulted for her worsening depression.  Care transferred to Dr. Ashok Cordia.  Final Clinical Impressions(s) / ED Diagnoses   Final diagnoses:  None    ED Discharge  Orders    None       Sherwood Gambler, MD 06/15/18 [phone]

## 2018-06-15 NOTE — ED Notes (Signed)
Pt had episode of being rigid and moaning and not responding, EDP notified. The episode lasted 10-15 seconds.

## 2018-06-15 NOTE — ED Triage Notes (Signed)
Pt family states she seized for 45 seconds. Not seizing when EMS arrived. Pt was under stress, arguing with family.

## 2018-06-16 DIAGNOSIS — S59912A Unspecified injury of left forearm, initial encounter: Secondary | ICD-10-CM | POA: Diagnosis not present

## 2018-06-16 LAB — RAPID URINE DRUG SCREEN, HOSP PERFORMED
Amphetamines: NOT DETECTED
Barbiturates: NOT DETECTED
Benzodiazepines: NOT DETECTED
Cocaine: NOT DETECTED
Opiates: NOT DETECTED
Tetrahydrocannabinol: NOT DETECTED

## 2018-06-16 LAB — URINALYSIS, ROUTINE W REFLEX MICROSCOPIC
Bilirubin Urine: NEGATIVE
Glucose, UA: NEGATIVE mg/dL
Hgb urine dipstick: NEGATIVE
Ketones, ur: NEGATIVE mg/dL
Leukocytes,Ua: NEGATIVE
Nitrite: NEGATIVE
Protein, ur: NEGATIVE mg/dL
Specific Gravity, Urine: 1.017 (ref 1.005–1.030)
pH: 7 (ref 5.0–8.0)

## 2018-06-16 LAB — SARS CORONAVIRUS 2 BY RT PCR (HOSPITAL ORDER, PERFORMED IN ~~LOC~~ HOSPITAL LAB): SARS Coronavirus 2: NEGATIVE

## 2018-06-16 MED ORDER — IBUPROFEN 400 MG PO TABS
400.0000 mg | ORAL_TABLET | Freq: Once | ORAL | Status: AC
  Administered 2018-06-16: 400 mg via ORAL
  Filled 2018-06-16: qty 1

## 2018-06-16 NOTE — ED Provider Notes (Signed)
Blood pressure 117/79, pulse 62, temperature 97.8 F (36.6 C), resp. rate 18, height 5\' 1"  (1.549 m), weight 72.6 kg, SpO2 99 %.  In short, [name] is a 53 y.o. female with a chief complaint of Tremors and Depression .  Refer to the original H&P for additional details.  05:02 PM  Psychiatry has examined the patient.  She continues to deny suicidal and homicidal ideation.  No hallucinations.  Plan for outpatient follow-up with her psychiatrist per the behavioral health note.  I confirmed this information with the patient.  Plan for discharge.     Margette Fast, MD 06/16/18 [phone]

## 2018-06-16 NOTE — BH Assessment (Signed)
Clinician again attempted to make contact with pt's mother for collateral information. There was no answer and no voicemail/answering machine picked up.  [name], pt's mother: [phone]

## 2018-06-16 NOTE — ED Notes (Signed)
Per patient, son is on the way to get her. States it would be about an hour

## 2018-06-16 NOTE — BHH Counselor (Signed)
Reassessment- Pt denies SI/HI and AVH. Pt states she will schedule an appointment to see her psychiatrist Dr. Modesta Messing.  Collateral information gathered from Pt's mother Dub Mikes. Tinnie Gens, NP spoke with Dub Mikes.  There are not MH concerns for the Pt.   Tinnie Gens, NP recommends D/C.  Lorenza Cambridge, [facility] Triage Specialist

## 2018-06-16 NOTE — Progress Notes (Signed)
CSW is attempting to obtain collateral information from patient's mother, [name], with her permission.  Unable to reach mother at number given [phone]).  Called [name], listed in patient's demo as a relative to verify number.  [name] acknowledges that this is correct number.  [name] agree to contact [name] and asked her to call this Probation officer.  [name]. Judi Cong, MSW, [facility] Social Work [phone] (cell) [phone] (office)

## 2018-06-16 NOTE — BHH Counselor (Signed)
Attempted to contact Pt's mother to obtain collateral information.  Mother, [name].  [phone].  Phone rang multiple times, no answer and no voicemail.

## 2018-06-16 NOTE — Progress Notes (Signed)
Patient ID: [name], female   DOB: 06-25-1965, 53 y.o.   MRN: [id number]  Reassessment  Patient was seen by TTS for reassessment. Patient denied feeling suicidal or homicidal. She denies psychotic symptoms. I spoke with guardian who stated her concerns was that patient was giving her money away to men that she had met on the Internet and that she was would not eat at times which would cause her to have seizures. She does have a history of seizure disorder. When asked if she felt like patient was a threat to her self or others she replied," she is a threat to herself because she is giving the men her money and she is going to go broke."  She states that patient has had a suicide attempt in the past although this was many years ago. She reports that patient has not attempted to harm herself recently. She states that patient does have outpatient psychiatric services with psychiatrist Dr. Modesta [name]. She reports as far as she know, patient is complaint with her medications. We discussed that at this time, patient does not meet  criteria for inpatient psychiatric hospitalization. Counselor will inform ED that patient has been psychiatrically cleared.

## 2018-06-16 NOTE — BH Assessment (Signed)
Pt provided the name and contact number for her mother so clinician could contact her for collateral information. Clinician attempted to make contact with pt's mother but there was no answer and a voicemail/answering machine did not pick up (the line clicked several times and sounded like dead air after approximately 8 rings).  [name], pt's mother: [phone]

## 2018-06-16 NOTE — ED Notes (Signed)
Pt eating breakfast 

## 2018-06-16 NOTE — Discharge Instructions (Signed)
Substance Abuse Treatment Programs ° °Intensive Outpatient Programs °[facility]     °[address]      °[place], [place]                   °[phone]      ° °[facility] °[address] °[place], [place] °336-379-7146 ° °[facility]     °(Inpatient and outpatient)     °[address]           °[phone]   ° °[facility] °336-288-1484 (Suboxone and Methadone) ° °[address]      °[place], [place] [address]      °336-882-2125      ° °[address] °[place], [place] °852-3033 ° °[facility] (Outpatient/Inpatient, Chemical)    °(insurance only) [phone]      °       °[facility] (Groups & Residential) °[place], [place] °336-389-1413 ° °   °[facility]     °[address]     °[place], [place]      °336-389-1413      ° °[facility] (for caregivers and family) °[address] °[place], [place] °336-299-4655 ° ° ° ° ° °Residential Treatment Programs °[facility]      °[address], [place], [place] [address]  °([phone]      ° °[facility] °[address] [place], [place] [address] °[phone] ° °[facility]        °336-248-8914      ° °[facility] °1-800-659-3381 ° °[facility]             °[address]                                         °[place], [place]                                                °877-615-2722 or 336-784-9470                              ° °[facility] [place] °[address] °[place], [address] °1.877.941.8954 ° °[facility]    °[address]      °[place], [place]     °[phone]      ° °[facility] °[address] °[place], [place] °336-285-9073 ° °[facility]   °[address]     °[place], [place] [address]     °336-899-1550      °Admissions: 8am-3pm M-F ° °[facility] °[address] °[place] °336-227-7417 ° °[facility]: Residential Program (90 Days)   °[place], [place]      °336-725-8389 or 800-758-6077    ° °[facility] °[place], [place] °(Walk in Hours over the weekend or by referral) ° °[facility] °[address], [place], [place] [address] °(336) [phone] ° °Crisis Mobile: [facility]:  1-877-626-1772 (for crisis response 24 hours a day) °[facility] Hotline:      1-800-256-2452 °Outpatient Psychiatry and Counseling ° °[facility]: Mobile Crisis   Management 24 hours:  1-877-626-1772 ° °[facility] sliding scale fee and walk in schedule: M-F 8am-12pm/1pm-3pm °[address]  °[place], [place] [address] °336-387-6161 ° °[facility] °[address] °[place], [place] [address] °336-703-9650 ° °[facility] ([facility] new patient walk-in appointments available Monday - Friday 8am -3pm.          °[address] °[place], [place] [address] °336-676-6840 or crisis line- 336-676-6905 ° °[facility]/ Intensive Outpatient Therapy Program °[address] °[place], [place] [address] °336-832-9804 ° °[facility]                  °Crisis Services      °336.641.4993      °[address]     °[place], [place] [address]                ° °[facility]   °[facility] °[phone] °[address] °[place], [place] [address] ° ° °[facility]          °[address]  °[place], [place] [address]       °(336) [phone] ° °[facility] °[address] °[place], [place] [address] °336-292-1510 ° °[facility]    °[address]    °[place], [place] [address]     °336-632-3505      ° °Parish McKinney, MD     °[address]     °[place] [address]     °336-282-1251     °  °[facility] °[address] °[place] [address] ° °[facility]     °[address]     °[place]      °336-542-2076      ° °[facility] °Shamsher Ahluwalia, MD °[address] °[place], [place] [address] °336-420-9558 ° °[facility]     °[address]     °[place], [place] [address]     °336-274-1237      ° °[facility] °[address] °[place], [place] [address] °336-854-4450 °Resources for Temporary Residential Assistance/Crisis Centers ° °DAY CENTERS °[facility] °M-F 8am-3pm   °[address] [place], [place] [address]   336-332-0824 °Services include: laundry, barbering, support groups, case management, phone  & computer access, showers, AA/NA mtgs, mental health/substance abuse nurse, job skills class, disability information, VA assistance, spiritual classes, etc.  ° °HOMELESS SHELTERS ° °[facility]     °[facility]   °[address], [place]     °336.271.5959       °       °[facility] (women and children)       °[address] °[place], [place] [address] °336-275-0820 °[email or web address] for application and process °Application Required ° °[facility]   °[address]    °[place] [address]     °[phone]       °             °[facility] °[address] °[place], [place] [address] °336.273.5572 °336-235-0363(schedule application appt.) °Application Required ° °[facility] (women only)    °[address]     °[place],  [address]     °336-884-1039      °  Intake starts 6pm daily °Need valid ID, SSC, & Police report °[facility] [place] °[address] °[place], [place] °336-881-5420 °Application Required ° °[facility] (men only)     °[address]      °[place], [place]     °336.748.1962      ° °[facility] °(Pregnant women only) °[address] °[place], [place] °336-275-0206 ° °[facility]      °[address]      °[place], [place] [address]     °336-722-9951      °       °[facility] °[address] °[place], [place] °336-723-1848 °90 day commitment/SA/Application process ° °[facility](men only)     °[address]     °[place], [place]     °336-748-1962       °Check-in at 7pm     °       °[facility] °[address] °[place], [place] [address] °336-248-6684 °Men/Women/Women and Children must be there by 7 pm ° °[facility] °[place], [place] °336-722-8721                ° °

## 2018-06-17 ENCOUNTER — Other Ambulatory Visit: Payer: Self-pay

## 2018-06-17 NOTE — Patient Outreach (Signed)
[facility]) Care Management  06/17/2018  DANNIELLE BASKINS 29-Aug-1965 [id number]   Telephone Screen  Referral Date: 06/15/2018 Referral Source: [facility]. Referral Reason: "tub slippery and sometimes falls,no shower seat, lives alone, crying spurts, grouchy and moody-taking Prozac and Lamictal-but doesn't feel they are effective anymore" Insurance: [facility]   Outreach attempt #2 to patient. No answer at present.    Plan: RN CM will make outreach attempt to patient within 3-4 business days.   Enzo Montgomery, RN,BSN,CCM [facility] Telephonic Care Management Coordinator Direct Phone: [phone] Toll Free: [phone] Fax: [phone]

## 2018-06-21 ENCOUNTER — Other Ambulatory Visit: Payer: Self-pay

## 2018-06-21 ENCOUNTER — Ambulatory Visit (HOSPITAL_COMMUNITY): Payer: PPO | Admitting: Psychiatry

## 2018-06-21 NOTE — Patient Outreach (Signed)
[place] [facility]) Care Management  06/21/2018  ARIADNA SETTER 14-Apr-1965 [id number]   Telephone Screen  Referral Date:06/15/2018 Referral Source:[facility] Dept. Referral Reason:"tub slippery and sometimes falls,no shower seat, lives alone, crying spurts, grouchy and moody-taking Prozac and Lamictal-but doesn't feel they are effective anymore" Insurance:[employer]   Outreach attempt #3 to patient. No answer at present.     Plan: RN CM will close case if no response from patient.   Enzo Montgomery, RN,BSN,CCM [facility] Telephonic Care Management Coordinator Direct Phone: [phone] Toll Free: [phone] Fax: [phone]

## 2018-06-22 NOTE — Progress Notes (Signed)
Virtual Visit via Telephone Note  I connected with [name] on 06/23/18 at  8:30 AM EDT by telephone and verified that I am speaking with the correct person using two identifiers.   I discussed the limitations, risks, security and privacy concerns of performing an evaluation and management service by telephone and the availability of in person appointments. I also discussed with the patient that there may be a patient responsible charge related to this service. The patient expressed understanding and agreed to proceed.    I discussed the assessment and treatment plan with the patient. The patient was provided an opportunity to ask questions and all were answered. The patient agreed with the plan and demonstrated an understanding of the instructions.   The patient was advised to call back or seek an in-person evaluation if the symptoms worsen or if the condition fails to improve as anticipated.  I provided 25 minutes of non-face-to-face time during this encounter.   Norman Clay, MD    [facility] MD/PA/NP OP Progress Note  06/23/2018 9:05 AM [name]  MRN:  [id number]  Chief Complaint:  Chief Complaint    Follow-up; Depression     HPI:  Per chart review, the patient was reportedly brought to ED by her family member after seizure like episode. They stated that clonazepam was decreased from 1 mg twice daily to 0.5 mg twice daily. Her family is concerned that the patient gave money to a man, who she met on the internet.   This is a follow-up visit for depression.  She states that she has been feeling better today.  She states that she has been feeling depressed and had seizure-like episode (as documented above).  She states that she is still up her dose of Ativan as she feels anxious.  She wonders if it might be related to anniversary of wedding in June.  She also states that her husband was buried on July 4 th.  She has passive SI, stating that her family will be better without the  patient.  She states that she continues to contact a man who she met online.  She states good relationship with him.  When she is asked about the concern of the patient giving money to him, she states that she gave him "$40, not $400." She states that her son was mad at the patient of what she did. She also agrees that it was not the good thing for her to do so, and she has not given any money to him since then. She agrees to be careful in this relationship as she has not yet to know this person.  She has insomnia.  She feels fatigue.  She enjoys working on flower bed, and taking a walk with her dog.  She has fair concentration.  She has decreased appetite.  She feels anxious and tense.  She occasionally has intense anxiety.    Visit Diagnosis:    ICD-10-CM   1. MDD (major depressive disorder), recurrent episode, mild ([place]) F33.0     Past Psychiatric History: Please see initial evaluation for full details. I have reviewed the history. No updates at this time.     Past Medical History:  Past Medical History:  Diagnosis Date  . Arthritis    "knees" (04/22/2012)  . Chronic bronchitis ([place])    "yearly; when the weather changes" (04/22/2012)  . Colon polyp   . Colon polyps    adenomatous and hyperplastic-  . Depression   . Eczema   .  Epilepsy ([place])    "been having them right often here lately" (04/22/2012)  . Fatty liver   . High cholesterol   . History of kidney stones   . Osteoarthritis    [name] 04/22/2012  . Other convulsions 05/21/12   non-epileptic spells  . Restless leg   . Seizures ([place])   . Vertigo     Past Surgical History:  Procedure Laterality Date  . ABDOMINAL HYSTERECTOMY  2001  . BLADDER SUSPENSION    . BUNIONECTOMY Left 2000  . CESAREAN SECTION  1987; 1988  . COLONOSCOPY    . JOINT REPLACEMENT    . MASS EXCISION  10/22/2011   Procedure: EXCISION MASS;  Surgeon: [name], MD;  Location: [facility];  Service: General;  Laterality: Right;  excision right buttock  mass  . TOTAL HIP ARTHROPLASTY Left 1993; 1995; 2000  . TOTAL KNEE ARTHROPLASTY Left 05/07/2015   Procedure: TOTAL LEFT KNEE ARTHROPLASTY;  Surgeon: [name], MD;  Location: [facility];  Service: Orthopedics;  Laterality: Left;  . TOTAL KNEE ARTHROPLASTY Right 10/01/2015   Procedure: RIGHT TOTAL KNEE ARTHROPLASTY;  Surgeon: [name], MD;  Location: [facility];  Service: Orthopedics;  Laterality: Right;    Family Psychiatric History: Please see initial evaluation for full details. I have reviewed the history. No updates at this time.     Family History:  Family History  Problem Relation Age of Onset  . Cancer Mother   . Depression Father   . Cancer Brother   . Diabetes Brother   . Cancer Maternal Aunt   . Diabetes Maternal Aunt   . Bipolar disorder Son   . Drug abuse Son   . Heart attack Sister 24       during severe illness    Social History:  Social History   Socioeconomic History  . Marital status: Widowed    Spouse name: [name]  . Number of children: 2  . Years of education: 12 +  . Highest education level: Not on file  Occupational History  . Occupation: disability, medical  Social Needs  . Financial resource strain: Not very hard  . Food insecurity:    Worry: Never true    Inability: Never true  . Transportation needs:    Medical: No    Non-medical: No  Tobacco Use  . Smoking status: Never Smoker  . Smokeless tobacco: Never Used  Substance and Sexual Activity  . Alcohol use: No    Alcohol/week: 0.0 standard drinks    Comment: 12-25-2015 per pt no  . Drug use: No    Comment: 21-12-2015 per pt no   . Sexual activity: Not Currently    Birth control/protection: Surgical  Lifestyle  . Physical activity:    Days per week: 7 days    Minutes per session: 60 min  . Stress: Rather much  Relationships  . Social connections:    Talks on phone: More than three times a week    Gets together: More than three times a week    Attends religious service: More than  4 times per year    Active member of club or organization: No    Attends meetings of clubs or organizations: Not on file    Relationship status: Widowed  Other Topics Concern  . Not on file  Social History Narrative   Patient has two adult children.   Patient is disabled.   Patient has a high school education and some trade school.   Patient is  right-handed.   Patient drinks four glasses of soda and tea daily.    Allergies:  Allergies  Allergen Reactions  . Acetaminophen Other (See Comments)    Has fatty deposits on liver  . Benadryl [Diphenhydramine Hcl] Other (See Comments)    hyperactivity and seizures  . Codeine Itching and Rash    seizures  . Dilantin [Phenytoin Sodium Extended] Other (See Comments)    Elevated LFT's  . Melatonin Other (See Comments)    seizures  . Tramadol Other (See Comments)    "causes seizures"  . Ultram [Tramadol Hcl] Other (See Comments)    Seizures  . Vimpat [Lacosamide] Other (See Comments)    Severe dizziness  . Mirtazapine Other (See Comments)    Potentiated seizure activity  . Betadine [Povidone Iodine] Rash  . Latex Rash  . Penicillins Itching and Rash    Has patient had a PCN reaction causing immediate rash, facial/tongue/throat swelling, SOB or lightheadedness with hypotension: No Has patient had a PCN reaction causing severe rash involving mucus membranes or skin necrosis: No Has patient had a PCN reaction that required hospitalization No Has patient had a PCN reaction occurring within the last 10 years: No If all of the above answers are "NO", then may proceed with Cephalosporin use.  . Sulfa Antibiotics Rash  . Tape Rash and Other (See Comments)    Paper tape please Paper tape please  . Vicodin [Hydrocodone-Acetaminophen] Itching    Metabolic Disorder Labs: Lab Results  Component Value Date   HGBA1C 5.4 11/28/2015   MPG 108 11/28/2015   No results found for: PROLACTIN Lab Results  Component Value Date   CHOL 151  09/21/2017   TRIG 124.0 09/21/2017   HDL 53.10 09/21/2017   CHOLHDL 3 09/21/2017   VLDL 24.8 09/21/2017   LDLCALC 73 09/21/2017   LDLCALC 86 07/07/2016   Lab Results  Component Value Date   TSH 1.52 09/21/2017   TSH 1.900 07/07/2016    Therapeutic Level Labs: No results found for: LITHIUM No results found for: VALPROATE No components found for:  CBMZ  Current Medications: Current Outpatient Medications  Medication Sig Dispense Refill  . FLUoxetine (PROZAC) 20 MG capsule Take 3 capsules (60 mg total) by mouth daily. 90 capsule 1  . FLUoxetine (PROZAC) 40 MG capsule Take 2 capsules (80 mg total) by mouth daily. 90 capsule 0  . gabapentin (NEURONTIN) 600 MG tablet Take 1 tablet (600 mg total) by mouth 3 (three) times daily. 270 tablet 3  . hydrOXYzine (ATARAX/VISTARIL) 25 MG tablet Take 1 tablet (25 mg total) by mouth 2 (two) times daily as needed for anxiety. 180 tablet 0  . lamoTRIgine (LAMICTAL) 150 MG tablet Take 1 tablet (150 mg total) by mouth 2 (two) times daily. For mood control 60 tablet 11  . levETIRAcetam (KEPPRA) 1000 MG tablet Take 1 in the morning and 1.5 tablet at bedtime. 75 tablet 5  . LORazepam (ATIVAN) 1 MG tablet Take 1 tablet (1 mg total) by mouth daily as needed for anxiety. 30 tablet 0  . pramipexole (MIRAPEX) 0.5 MG tablet Take 1 tablet by mouth twice daily 60 tablet 5   No current facility-administered medications for this visit.      Musculoskeletal: Strength & Muscle Tone: N/A Gait & Station: N/A Patient leans: N/A  Psychiatric Specialty Exam: Review of Systems  Psychiatric/Behavioral: Positive for depression and suicidal ideas. Negative for hallucinations, memory loss and substance abuse. The patient is nervous/anxious and has insomnia.   All other  systems reviewed and are negative.   There were no vitals taken for this visit.There is no height or weight on file to calculate BMI.  General Appearance: NA  Eye Contact:  NA  Speech:  Clear and  Coherent  Volume:  Normal  Mood:  "beter:  Affect:  NA  Thought Process:  Coherent  Orientation:  Full (Time, Place, and Person)  Thought Content: Logical   Suicidal Thoughts:  Yes.  without intent/plan  Homicidal Thoughts:  No  Memory:  Immediate;   Good  Judgement:  Good  Insight:  Fair  Psychomotor Activity:  Normal  Concentration:  Concentration: Good and Attention Span: Good  Recall:  Good  Fund of Knowledge: Good  Language: Good  Akathisia:  No  Handed:  Right  AIMS (if indicated): not done  Assets:  Desire for Improvement  ADL's:  Intact  Cognition: WNL  Sleep:  Poor   Screenings: AIMS     ED to Hosp-Admission (Discharged) from 10/30/2017 in [facility]  AIMS Total Score  0    AUDIT     ED to Hosp-Admission (Discharged) from 10/30/2017 in [facility]  Alcohol Use Disorder Identification Test Final Score (AUDIT)  0    GAD-7     Office Visit from 01/31/2015 in [facility]  Total GAD-7 Score  15    Mini-Mental     Office Visit from 02/20/2014 in [facility]  Total Score (max 30 points )  30    PHQ2-9     Patient Outreach from 08/31/2017 in [facility] Office Visit from 04/17/2017 in [facility] Visit from 04/13/2017 in [facility] Visit from 02/03/2017 in [facility] Visit from 01/30/2017 in [facility]  PHQ-2 Total Score  '2  1  2  2  ' 0  PHQ-9 Total Score  14  -  17  13  -       Assessment and Plan:  [name] is a 53 y.o. year old female with a history of depression, pseudoseizure,  partialsymptomatic epilepsy with complex partial seizures, osteoarthritis, congenital hip dislocation s/p replacement , who presents for follow up appointment for MDD (major depressive disorder), recurrent episode, mild ([place])  # MDD, mild, recurrent  without psychotic features She reports slight worsening in depressive symptoms in the context of upcoming wedding anniversary in June.  Will uptitrate fluoxetine to target depression and anxiety.  Will also uptitrate Ativan as needed for anxiety.  Discussed risk of dependence and oversedation.  Validated her grief.  She is interested in therapy; will make referral again.   Plan 1. Increase fluoxetine 80 mg daily  2. Increase ativan 1 mg daily as needed for anxiety 3. Continue hydroxyzine 25 mg twice a day as needed for anxiety 4. Next appointment 7/8 at 8:30 for 30 mins, phone - Referral to therapy  -on gabapentin, Lamictal, Keppra for seizure. - on pramipexole for restless leg  Past trials of medication: fluoxetine,sertraline (increased appetite),citalopram, mirtazapine (patient self discontinued as she was afraid of )Trazodone, lorazepam, clonazepam   The patient demonstrates the following risk factors for suicide: Chronic risk factors for suicide include: psychiatric disorder of depressionand previous suicide attempts of overdosing medication. Acute risk factorsfor suicide include: unemployment and her husband with terminal illness. Protective factorsfor this patient include: positive social support, coping skills and hope for the future. Considering these factors, the overall  suicide risk at this point appears to be low. Patient isappropriate for outpatient follow up.  The duration of this appointment visit was 25 minutes of non face-to-face time with the patient.  Greater than 50% of this time was spent in counseling, explanation of  diagnosis, planning of further management, and coordination of care.  Norman Clay, MD 06/23/2018, 9:05 AM

## 2018-06-23 ENCOUNTER — Encounter (HOSPITAL_COMMUNITY): Payer: Self-pay | Admitting: Psychiatry

## 2018-06-23 ENCOUNTER — Other Ambulatory Visit: Payer: Self-pay

## 2018-06-23 ENCOUNTER — Ambulatory Visit (INDEPENDENT_AMBULATORY_CARE_PROVIDER_SITE_OTHER): Payer: PPO | Admitting: Psychiatry

## 2018-06-23 DIAGNOSIS — F330 Major depressive disorder, recurrent, mild: Secondary | ICD-10-CM | POA: Diagnosis not present

## 2018-06-23 MED ORDER — FLUOXETINE HCL 40 MG PO CAPS: 80.0000 mg | ORAL_CAPSULE | Freq: Every day | ORAL | 0 refills | Status: DC

## 2018-06-23 MED ORDER — LORAZEPAM 1 MG PO TABS: 1.0000 mg | ORAL_TABLET | Freq: Every day | ORAL | 0 refills | Status: DC | PRN

## 2018-06-23 NOTE — Patient Instructions (Signed)
1. Increase fluoxetine 80 mg daily  2. Increase ativan 1 mg daily as needed for anxiety 3. Continue hydroxyzine 25 mg twice a day as needed for anxiety 4. Next appointment 7/8 at 8:30

## 2018-06-28 ENCOUNTER — Other Ambulatory Visit: Payer: Self-pay

## 2018-06-28 ENCOUNTER — Ambulatory Visit (INDEPENDENT_AMBULATORY_CARE_PROVIDER_SITE_OTHER): Payer: PPO | Admitting: Licensed Clinical Social Worker

## 2018-06-28 ENCOUNTER — Encounter (HOSPITAL_COMMUNITY): Payer: Self-pay | Admitting: Licensed Clinical Social Worker

## 2018-06-28 DIAGNOSIS — F330 Major depressive disorder, recurrent, mild: Secondary | ICD-10-CM | POA: Diagnosis not present

## 2018-06-28 NOTE — Patient Outreach (Signed)
[facility]) Care Management  06/28/2018  [name] May 10, 1965 [id number]    Telephone Screen  Referral Date:06/15/2018 Referral Source:[facility] Dept. Referral Reason:"tub slippery and sometimes falls,no shower seat, lives alone, crying spurts, grouchy and moody-taking Prozac and Lamictal-but doesn't feel they are effective anymore" Insurance:[employer]   Multiple attempts to establish contact with patient without success. No response from letter mailed to patient. Case is being closed at this time.      Plan: RN CM will close case at this time.   Enzo Montgomery, RN,BSN,CCM [facility] Telephonic Care Management Coordinator Direct Phone: [phone] Toll Free: [phone] Fax: [phone]
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
## 2018-07-14 NOTE — Progress Notes (Deleted)
[facility] MD/PA/NP OP Progress Note  07/14/2018 10:59 AM [name]  MRN:  [id number]  Chief Complaint:  HPI: *** Visit Diagnosis: No diagnosis found.  Past Psychiatric History: Please see initial evaluation for full details. I have reviewed the history. No updates at this time.     Past Medical History:  Past Medical History:  Diagnosis Date  . Arthritis    "knees" (04/22/2012)  . Chronic bronchitis ([place])    "yearly; when the weather changes" (04/22/2012)  . Colon polyp   . Colon polyps    adenomatous and hyperplastic-  . Depression   . Eczema   . Epilepsy ([place])    "been having them right often here lately" (04/22/2012)  . Fatty liver   . High cholesterol   . History of kidney stones   . Osteoarthritis    [name] 04/22/2012  . Other convulsions 05/21/12   non-epileptic spells  . Restless leg   . Seizures ([place])   . Vertigo     Past Surgical History:  Procedure Laterality Date  . ABDOMINAL HYSTERECTOMY  2001  . BLADDER SUSPENSION    . BUNIONECTOMY Left 2000  . CESAREAN SECTION  1987; 1988  . COLONOSCOPY    . JOINT REPLACEMENT    . MASS EXCISION  10/22/2011   Procedure: EXCISION MASS;  Surgeon: [name], MD;  Location: [place];  Service: General;  Laterality: Right;  excision right buttock mass  . TOTAL HIP ARTHROPLASTY Left 1993; 1995; 2000  . TOTAL KNEE ARTHROPLASTY Left 05/07/2015   Procedure: TOTAL LEFT KNEE ARTHROPLASTY;  Surgeon: [name], MD;  [facility];  Service: Orthopedics;  Laterality: Left;  . TOTAL KNEE ARTHROPLASTY Right 10/01/2015   Procedure: RIGHT TOTAL KNEE ARTHROPLASTY;  Surgeon: [name], MD;  Location: [facility];  Service: Orthopedics;  Laterality: Right;    Family Psychiatric History: Please see initial evaluation for full details. I have reviewed the history. No updates at this time.     Family History:  Family History  Problem Relation Age of Onset  . Cancer Mother   . Depression Father   . Cancer Brother   . Diabetes  Brother   . Cancer Maternal Aunt   . Diabetes Maternal Aunt   . Bipolar disorder Son   . Drug abuse Son   . Heart attack Sister 76       during severe illness    Social History:  Social History   Socioeconomic History  . Marital status: Widowed    Spouse name: [name]  . Number of children: 2  . Years of education: 12 +  . Highest education level: Not on file  Occupational History  . Occupation: disability, medical  Social Needs  . Financial resource strain: Not very hard  . Food insecurity    Worry: Never true    Inability: Never true  . Transportation needs    Medical: No    Non-medical: No  Tobacco Use  . Smoking status: Never Smoker  . Smokeless tobacco: Never Used  Substance and Sexual Activity  . Alcohol use: No    Alcohol/week: 0.0 standard drinks    Comment: 12-25-2015 per pt no  . Drug use: No    Comment: 21-12-2015 per pt no   . Sexual activity: Not Currently    Birth control/protection: Surgical  Lifestyle  . Physical activity    Days per week: 7 days    Minutes per session: 60 min  . Stress: Rather much  Relationships  .  Social connections    Talks on phone: More than three times a week    Gets together: More than three times a week    Attends religious service: More than 4 times per year    Active member of club or organization: No    Attends meetings of clubs or organizations: Not on file    Relationship status: Widowed  Other Topics Concern  . Not on file  Social History Narrative   Patient has two adult children.   Patient is disabled.   Patient has a high school education and some trade school.   Patient is right-handed.   Patient drinks four glasses of soda and tea daily.    Allergies:  Allergies  Allergen Reactions  . Acetaminophen Other (See Comments)    Has fatty deposits on liver  . Benadryl [Diphenhydramine Hcl] Other (See Comments)    hyperactivity and seizures  . Codeine Itching and Rash    seizures  . Dilantin [Phenytoin  Sodium Extended] Other (See Comments)    Elevated LFT's  . Melatonin Other (See Comments)    seizures  . Tramadol Other (See Comments)    "causes seizures"  . Ultram [Tramadol Hcl] Other (See Comments)    Seizures  . Vimpat [Lacosamide] Other (See Comments)    Severe dizziness  . Mirtazapine Other (See Comments)    Potentiated seizure activity  . Betadine [Povidone Iodine] Rash  . Latex Rash  . Penicillins Itching and Rash    Has patient had a PCN reaction causing immediate rash, facial/tongue/throat swelling, SOB or lightheadedness with hypotension: No Has patient had a PCN reaction causing severe rash involving mucus membranes or skin necrosis: No Has patient had a PCN reaction that required hospitalization No Has patient had a PCN reaction occurring within the last 10 years: No If all of the above answers are "NO", then may proceed with Cephalosporin use.  . Sulfa Antibiotics Rash  . Tape Rash and Other (See Comments)    Paper tape please Paper tape please  . Vicodin [Hydrocodone-Acetaminophen] Itching    Metabolic Disorder Labs: Lab Results  Component Value Date   HGBA1C 5.4 11/28/2015   MPG 108 11/28/2015   No results found for: PROLACTIN Lab Results  Component Value Date   CHOL 151 09/21/2017   TRIG 124.0 09/21/2017   HDL 53.10 09/21/2017   CHOLHDL 3 09/21/2017   VLDL 24.8 09/21/2017   LDLCALC 73 09/21/2017   LDLCALC 86 07/07/2016   Lab Results  Component Value Date   TSH 1.52 09/21/2017   TSH 1.900 07/07/2016    Therapeutic Level Labs: No results found for: LITHIUM No results found for: VALPROATE No components found for:  CBMZ  Current Medications: Current Outpatient Medications  Medication Sig Dispense Refill  . FLUoxetine (PROZAC) 40 MG capsule Take 2 capsules (80 mg total) by mouth daily. 90 capsule 0  . gabapentin (NEURONTIN) 600 MG tablet Take 1 tablet (600 mg total) by mouth 3 (three) times daily. 270 tablet 3  . hydrOXYzine (ATARAX/VISTARIL)  25 MG tablet Take 1 tablet (25 mg total) by mouth 2 (two) times daily as needed for anxiety. 180 tablet 0  . lamoTRIgine (LAMICTAL) 150 MG tablet Take 1 tablet (150 mg total) by mouth 2 (two) times daily. For mood control 60 tablet 11  . levETIRAcetam (KEPPRA) 1000 MG tablet Take 1 in the morning and 1.5 tablet at bedtime. 75 tablet 5  . LORazepam (ATIVAN) 1 MG tablet Take 1 tablet (1 mg total)  by mouth daily as needed for anxiety. 30 tablet 0  . pramipexole (MIRAPEX) 0.5 MG tablet Take 1 tablet by mouth twice daily 60 tablet 5   No current facility-administered medications for this visit.      Musculoskeletal: Strength & Muscle Tone: N/A Gait & Station: N/A Patient leans: N/A  Psychiatric Specialty Exam: ROS  There were no vitals taken for this visit.There is no height or weight on file to calculate BMI.  General Appearance: {Appearance:22683}  Eye Contact:  {BHH EYE CONTACT:22684}  Speech:  Clear and Coherent  Volume:  Normal  Mood:  {BHH MOOD:22306}  Affect:  {Affect (PAA):22687}  Thought Process:  Coherent  Orientation:  Full (Time, Place, and Person)  Thought Content: Logical   Suicidal Thoughts:  {ST/HT (PAA):22692}  Homicidal Thoughts:  {ST/HT (PAA):22692}  Memory:  Immediate;   Good  Judgement:  {Judgement (PAA):22694}  Insight:  {Insight (PAA):22695}  Psychomotor Activity:  Normal  Concentration:  Concentration: Good and Attention Span: Good  Recall:  Good  Fund of Knowledge: Good  Language: Good  Akathisia:  No  Handed:  Right  AIMS (if indicated): not done  Assets:  Communication Skills Desire for Improvement  ADL's:  Intact  Cognition: WNL  Sleep:  {BHH GOOD/FAIR/POOR:22877}   Screenings: AIMS     ED to Hosp-Admission (Discharged) from 10/30/2017 in [facility]  AIMS Total Score  0    AUDIT     ED to Hosp-Admission (Discharged) from 10/30/2017 in [facility]  Alcohol Use Disorder  Identification Test Final Score (AUDIT)  0    GAD-7     Office Visit from 01/31/2015 in [facility]  Total GAD-7 Score  15    Mini-Mental     Office Visit from 02/20/2014 in [facility]  Total Score (max 30 points )  30    PHQ2-9     Patient Outreach from 08/31/2017 in [facility] Office Visit from 04/17/2017 in [facility] Office Visit from 04/13/2017 in [facility] Office Visit from 02/03/2017 in [facility] Office Visit from 01/30/2017 in [facility]  PHQ-2 Total Score  2  1  2  2   0  PHQ-9 Total Score  14  -  17  13  -       Assessment and Plan:  JAQUAY MORNEAULT is a 53 y.o. year old female with a history of depression, pseudoseizure,  partialsymptomatic epilepsy with complex partial seizures, osteoarthritis, congenital hip dislocation s/p replacement  , who presents for follow up appointment for No diagnosis found.  # MDD, mild, recurrent without psychotic features  She reports slight worsening in depressive symptoms in the context of upcoming wedding anniversary in June.  Will uptitrate fluoxetine to target depression and anxiety.  Will also uptitrate Ativan as needed for anxiety.  Discussed risk of dependence and oversedation.  Validated her grief.  She is interested in therapy; will make referral again.   Plan 1. Increase fluoxetine 80 mg daily  2. Increase ativan 1 mg daily as needed for anxiety 3. Continue hydroxyzine 25 mg twice a day as needed for anxiety 4. Next appointment 7/8 at 8:30 for 30 mins, phone - Referral to therapy  -on gabapentin, Lamictal, Keppra for seizure. - on pramipexole for restless leg  Past trials of medication: fluoxetine,sertraline (increased appetite),citalopram, mirtazapine (patient self discontinued as she was afraid of )Trazodone, lorazepam, clonazepam  The patient demonstrates the following risk  factors for suicide: Chronic risk factors for suicide include: psychiatric disorder of depressionand previous suicide attempts of overdosing medication. Acute risk factorsfor suicide include: unemployment and her husband with terminal illness. Protective factorsfor this patient include: positive social support, coping skills and hope for the future. Considering these factors, the overall suicide risk at this point appears to be low. Patient isappropriate for outpatient follow up.  Norman Clay, MD 07/14/2018, 10:59 AM

## 2018-07-21 ENCOUNTER — Ambulatory Visit (HOSPITAL_COMMUNITY): Payer: PPO | Admitting: Psychiatry

## 2018-07-21 ENCOUNTER — Other Ambulatory Visit: Payer: Self-pay

## 2018-07-21 ENCOUNTER — Telehealth (HOSPITAL_COMMUNITY): Payer: Self-pay | Admitting: Psychiatry

## 2018-07-21 NOTE — Telephone Encounter (Signed)
Contacted both home phone and mobile phone twice for each. The patient did not answer the phone (her grand daughter answered cell phone, but she states that the patient is not there). Left voice message to contact the office.

## 2018-07-27 DIAGNOSIS — Z0289 Encounter for other administrative examinations: Secondary | ICD-10-CM

## 2018-07-30 ENCOUNTER — Other Ambulatory Visit (HOSPITAL_COMMUNITY): Payer: Self-pay | Admitting: Psychiatry

## 2018-07-30 MED ORDER — FLUOXETINE HCL 40 MG PO CAPS: 80.0000 mg | ORAL_CAPSULE | Freq: Every day | ORAL | 0 refills | Status: DC

## 2018-08-18 ENCOUNTER — Other Ambulatory Visit: Payer: Self-pay

## 2018-08-18 ENCOUNTER — Ambulatory Visit (INDEPENDENT_AMBULATORY_CARE_PROVIDER_SITE_OTHER): Payer: PPO | Admitting: Psychiatry

## 2018-08-18 ENCOUNTER — Encounter (HOSPITAL_COMMUNITY): Payer: Self-pay | Admitting: Psychiatry

## 2018-08-18 DIAGNOSIS — F330 Major depressive disorder, recurrent, mild: Secondary | ICD-10-CM

## 2018-08-18 DIAGNOSIS — G4700 Insomnia, unspecified: Secondary | ICD-10-CM

## 2018-08-18 MED ORDER — LORAZEPAM 1 MG PO TABS: 1.0000 mg | ORAL_TABLET | Freq: Every day | ORAL | 2 refills | Status: DC | PRN

## 2018-08-18 NOTE — Progress Notes (Signed)
Virtual Visit via Telephone Note  I connected with [name] on 08/18/18 at  4:00 PM EDT by telephone and verified that I am speaking with the correct person using two identifiers.   I discussed the limitations, risks, security and privacy concerns of performing an evaluation and management service by telephone and the availability of in person appointments. I also discussed with the patient that there may be a patient responsible charge related to this service. The patient expressed understanding and agreed to proceed.    I discussed the assessment and treatment plan with the patient. The patient was provided an opportunity to ask questions and all were answered. The patient agreed with the plan and demonstrated an understanding of the instructions.   The patient was advised to call back or seek an in-person evaluation if the symptoms worsen or if the condition fails to improve as anticipated.  I provided 15 minutes of non-face-to-face time during this encounter.   Norman Clay, MD    [facility] MD/PA/NP OP Progress Note  08/18/2018 4:21 PM [name]  MRN:  [id number]  Chief Complaint:  Chief Complaint    Anxiety; Follow-up; Depression     HPI:  This is a follow-up appointment for depression.  She states that she had 2 seizures; one occurred on July 4, which was anniversary of her husband being buried. Another occurred when she was not doing anything. She has an upcoming appointment with her neurologist.  She believes that she has been doing okay otherwise. Although she misses her husband and feels that "here is here," she also thinks that he is better as he was very sick. She had a good birthday with her family. She also reports good connection with her sister in law, who also lost one's family.  She has insomnia at times.  She feels less depressed.  She has more motivation and energy, although she has not been able to work on flower bed due to heat.  She has fair concentration.   She denies SI.  She feels anxious and tense at times.  She denies panic attacks. Discussed attendance policy.    Visit Diagnosis:    ICD-10-CM   1. MDD (major depressive disorder), recurrent episode, mild ([place])  F33.0     Past Psychiatric History: Please see initial evaluation for full details. I have reviewed the history. No updates at this time.     Past Medical History:  Past Medical History:  Diagnosis Date  . Arthritis    "knees" (04/22/2012)  . Chronic bronchitis ([place])    "yearly; when the weather changes" (04/22/2012)  . Colon polyp   . Colon polyps    adenomatous and hyperplastic-  . Depression   . Eczema   . Epilepsy ([place])    "been having them right often here lately" (04/22/2012)  . Fatty liver   . High cholesterol   . History of kidney stones   . Osteoarthritis    [name] 04/22/2012  . Other convulsions 05/21/12   non-epileptic spells  . Restless leg   . Seizures ([place])   . Vertigo     Past Surgical History:  Procedure Laterality Date  . ABDOMINAL HYSTERECTOMY  2001  . BLADDER SUSPENSION    . BUNIONECTOMY Left 2000  . CESAREAN SECTION  1987; 1988  . COLONOSCOPY    . JOINT REPLACEMENT    . MASS EXCISION  10/22/2011   Procedure: EXCISION MASS;  Surgeon: [name], MD;  Location: [place];  Service:  General;  Laterality: Right;  excision right buttock mass  . TOTAL HIP ARTHROPLASTY Left 1993; 1995; 2000  . TOTAL KNEE ARTHROPLASTY Left 05/07/2015   Procedure: TOTAL LEFT KNEE ARTHROPLASTY;  Surgeon: [name], MD;  [facility];  Service: Orthopedics;  Laterality: Left;  . TOTAL KNEE ARTHROPLASTY Right 10/01/2015   Procedure: RIGHT TOTAL KNEE ARTHROPLASTY;  Surgeon: [name], MD;  Location: [facility];  Service: Orthopedics;  Laterality: Right;    Family Psychiatric History: Please see initial evaluation for full details. I have reviewed the history. No updates at this time.     Family History:  Family History  Problem Relation Age of Onset  .  Cancer Mother   . Depression Father   . Cancer Brother   . Diabetes Brother   . Cancer Maternal Aunt   . Diabetes Maternal Aunt   . Bipolar disorder Son   . Drug abuse Son   . Heart attack Sister 11       during severe illness    Social History:  Social History   Socioeconomic History  . Marital status: Widowed    Spouse name: [name]  . Number of children: 2  . Years of education: 12 +  . Highest education level: Not on file  Occupational History  . Occupation: disability, medical  Social Needs  . Financial resource strain: Not very hard  . Food insecurity    Worry: Never true    Inability: Never true  . Transportation needs    Medical: No    Non-medical: No  Tobacco Use  . Smoking status: Never Smoker  . Smokeless tobacco: Never Used  Substance and Sexual Activity  . Alcohol use: No    Alcohol/week: 0.0 standard drinks    Comment: 12-25-2015 per pt no  . Drug use: No    Comment: 21-12-2015 per pt no   . Sexual activity: Not Currently    Birth control/protection: Surgical  Lifestyle  . Physical activity    Days per week: 7 days    Minutes per session: 60 min  . Stress: Rather much  Relationships  . Social connections    Talks on phone: More than three times a week    Gets together: More than three times a week    Attends religious service: More than 4 times per year    Active member of club or organization: No    Attends meetings of clubs or organizations: Not on file    Relationship status: Widowed  Other Topics Concern  . Not on file  Social History Narrative   Patient has two adult children.   Patient is disabled.   Patient has a high school education and some trade school.   Patient is right-handed.   Patient drinks four glasses of soda and tea daily.    Allergies:  Allergies  Allergen Reactions  . Acetaminophen Other (See Comments)    Has fatty deposits on liver  . Benadryl [Diphenhydramine Hcl] Other (See Comments)    hyperactivity and  seizures  . Codeine Itching and Rash    seizures  . Dilantin [Phenytoin Sodium Extended] Other (See Comments)    Elevated LFT's  . Melatonin Other (See Comments)    seizures  . Tramadol Other (See Comments)    "causes seizures"  . Ultram [Tramadol Hcl] Other (See Comments)    Seizures  . Vimpat [Lacosamide] Other (See Comments)    Severe dizziness  . Mirtazapine Other (See Comments)    Potentiated seizure  activity  . Betadine [Povidone Iodine] Rash  . Latex Rash  . Penicillins Itching and Rash    Has patient had a PCN reaction causing immediate rash, facial/tongue/throat swelling, SOB or lightheadedness with hypotension: No Has patient had a PCN reaction causing severe rash involving mucus membranes or skin necrosis: No Has patient had a PCN reaction that required hospitalization No Has patient had a PCN reaction occurring within the last 10 years: No If all of the above answers are "NO", then may proceed with Cephalosporin use.  . Sulfa Antibiotics Rash  . Tape Rash and Other (See Comments)    Paper tape please Paper tape please  . Vicodin [Hydrocodone-Acetaminophen] Itching    Metabolic Disorder Labs: Lab Results  Component Value Date   HGBA1C 5.4 11/28/2015   MPG 108 11/28/2015   No results found for: PROLACTIN Lab Results  Component Value Date   CHOL 151 09/21/2017   TRIG 124.0 09/21/2017   HDL 53.10 09/21/2017   CHOLHDL 3 09/21/2017   VLDL 24.8 09/21/2017   LDLCALC 73 09/21/2017   LDLCALC 86 07/07/2016   Lab Results  Component Value Date   TSH 1.52 09/21/2017   TSH 1.900 07/07/2016    Therapeutic Level Labs: No results found for: LITHIUM No results found for: VALPROATE No components found for:  CBMZ  Current Medications: Current Outpatient Medications  Medication Sig Dispense Refill  . FLUoxetine (PROZAC) 40 MG capsule Take 2 capsules (80 mg total) by mouth daily. 180 capsule 0  . gabapentin (NEURONTIN) 600 MG tablet Take 1 tablet (600 mg total) by  mouth 3 (three) times daily. 270 tablet 3  . hydrOXYzine (ATARAX/VISTARIL) 25 MG tablet Take 1 tablet (25 mg total) by mouth 2 (two) times daily as needed for anxiety. 180 tablet 0  . lamoTRIgine (LAMICTAL) 150 MG tablet Take 1 tablet (150 mg total) by mouth 2 (two) times daily. For mood control 60 tablet 11  . levETIRAcetam (KEPPRA) 1000 MG tablet Take 1 in the morning and 1.5 tablet at bedtime. 75 tablet 5  . LORazepam (ATIVAN) 1 MG tablet Take 1 tablet (1 mg total) by mouth daily as needed for anxiety. 30 tablet 2  . pramipexole (MIRAPEX) 0.5 MG tablet Take 1 tablet by mouth twice daily 60 tablet 5   No current facility-administered medications for this visit.      Musculoskeletal: Strength & Muscle Tone: N/A Gait & Station: N/A Patient leans: N/A  Psychiatric Specialty Exam: Review of Systems  Psychiatric/Behavioral: Positive for depression. Negative for hallucinations, memory loss, substance abuse and suicidal ideas. The patient is nervous/anxious and has insomnia.   All other systems reviewed and are negative.   There were no vitals taken for this visit.There is no height or weight on file to calculate BMI.  General Appearance: NA  Eye Contact:  NA  Speech:  Clear and Coherent  Volume:  Normal  Mood:  "fine"  Affect:  Negative  Thought Process:  Coherent  Orientation:  Full (Time, Place, and Person)  Thought Content: Logical   Suicidal Thoughts:  No  Homicidal Thoughts:  No  Memory:  Immediate;   Good  Judgement:  Good  Insight:  Fair  Psychomotor Activity:  Normal  Concentration:  Concentration: Good and Attention Span: Good  Recall:  Good  Fund of Knowledge: Good  Language: Good  Akathisia:  No  Handed:  Right  AIMS (if indicated): not done  Assets:  Communication Skills Desire for Improvement  ADL's:  Intact  Cognition:  WNL  Sleep:  Poor   Screenings: AIMS     ED to Hosp-Admission (Discharged) from 10/30/2017 in [facility]  [facility]  AIMS Total Score  0    AUDIT     ED to Hosp-Admission (Discharged) from 10/30/2017 in [place] [facility]  Alcohol Use Disorder Identification Test Final Score (AUDIT)  0    GAD-7     Office Visit from 01/31/2015 in [place]  Total GAD-7 Score  15    Mini-Mental     Office Visit from 02/20/2014 in [facility]  Total Score (max 30 points )  30    PHQ2-9     Patient Outreach from 08/31/2017 in [facility] Visit from 04/17/2017 in [place] Office Visit from 04/13/2017 in [facility] [place] Office Visit from 02/03/2017 in [facility] Visit from 01/30/2017 in [facility]  PHQ-2 Total Score  2  1  2  2   0  PHQ-9 Total Score  14  -  17  13  -       Assessment and Plan:  BRAYLON LEMMONS is a 53 y.o. year old female with a history of depression, pseudoseizure,  partialsymptomatic epilepsy with complex partial seizures, osteoarthritis, congenital hip dislocation s/p replacement, who presents for follow up appointment for depression.   # MDD, mild, recurrent without psychotic features There has been overall improvement in depressive symptoms and anxiety except in July, which was anniversary of her husband being in buried.  Will continue fluoxetine at the current dose to target depression and anxiety.  Will continue Lorazepam as needed for anxiety.  Discussed risk of dependence and oversedation.  She will greatly benefit from grief therapy and CBT; she is encouraged to continue see Mr. Sheets.  Plan 1. Continue fluoxetine 80 mg daily  2. Continue lorazepam 1 mg daily as needed for anxiety 3. Continue hydroxyzine 25 mg twice a day as needed for anxiety (she rarely takes this medication) 4. Next appointment: 10/19 at 10:40 for 20 mins, phone -on gabapentin, Lamictal, Keppra for seizure. - on pramipexole for  restless leg  Past trials of medication: fluoxetine,sertraline (increased appetite),citalopram, mirtazapine (patient self discontinued as she was afraid of )Trazodone, lorazepam, clonazepam   The patient demonstrates the following risk factors for suicide: Chronic risk factors for suicide include: psychiatric disorder of depressionand previous suicide attempts of overdosing medication. Acute risk factorsfor suicide include: unemployment and her husband with terminal illness. Protective factorsfor this patient include: positive social support, coping skills and hope for the future. Considering these factors, the overall suicide risk at this point appears to be low. Patient isappropriate for outpatient follow up.   Norman Clay, MD 08/18/2018, 4:21 PM

## 2018-08-18 NOTE — Patient Instructions (Signed)
1. Continue fluoxetine 80 mg daily  2. Continue lorazepam 1 mg daily as needed for anxiety 3. Continue hydroxyzine 25 mg twice a day as needed for anxiety 4. Next appointment: 10/19 at 10:40

## 2018-08-23 ENCOUNTER — Encounter: Payer: Self-pay | Admitting: Family Medicine

## 2018-08-23 ENCOUNTER — Other Ambulatory Visit: Payer: Self-pay

## 2018-08-23 ENCOUNTER — Ambulatory Visit: Payer: PPO | Admitting: Neurology

## 2018-08-23 ENCOUNTER — Ambulatory Visit (INDEPENDENT_AMBULATORY_CARE_PROVIDER_SITE_OTHER): Payer: PPO | Admitting: Family Medicine

## 2018-08-23 VITALS — BP 110/75 | HR 71 | Temp 98.0°F | Ht 60.0 in | Wt 161.6 lb

## 2018-08-23 DIAGNOSIS — F445 Conversion disorder with seizures or convulsions: Secondary | ICD-10-CM | POA: Diagnosis not present

## 2018-08-23 MED ORDER — LEVETIRACETAM 1000 MG PO TABS: ORAL_TABLET | ORAL | 11 refills | Status: DC

## 2018-08-23 MED ORDER — LAMOTRIGINE 150 MG PO TABS: 150.0000 mg | ORAL_TABLET | Freq: Two times a day (BID) | ORAL | 11 refills | Status: DC

## 2018-08-23 NOTE — Progress Notes (Signed)
PATIENT: [name] DOB: [birth year]  REASON FOR VISIT: follow up HISTORY FROM: patient  Chief Complaint  Patient presents with   Follow-up    Room 5, alone. Pseudoseizures. "a little better" -had a seizure 07/17/18     HISTORY OF PRESENT ILLNESS: Today 08/24/18 [name] is a 53 y.o. female here today for follow up of pseudoseizure. Overall she is doing fairly well. She continues to grieve the loss of her husband. She reports that her last seizure was on July 4th. She was in [employer] and became agitated and scared when she got lost in the store. She denies LOC. She reports having a "spell." her son was with her and took her home. No medical attention sought. She continues levetiracetam 1000mg  in the morning and 1500mg  in the evening. She is also taking lamotrigine 150mg  twice daily for mood stabilization. She is followed by PCP and psychiatry regularly.   HISTORY: (copied from Dr Dohmeier's note on 02/22/2018)  Rv 02-22-2018, patient with non epileptic seizures ( see the unusual EMU report from 04-2017 [facility], after her medications were held, she had a "spell " and presented to the ED). I have the pleasure of seeing her today again after she had supposing the seizures.  Nurse practitioner [facility] send me a note on 07 December 2017.  She stated that she had seen the patient the week prior to Thanksgiving and she was not quite herself at the time she would be confused, hesitating and answering questions over just repeatedly say I do not know.  Her daughter-in-law was present and thought she may have had a seizure in her sleep however after the nurse practitioner left [name] reportedly had 3 spells.  She had been given Flexeril for flank pain and had taken 1 in the 24 hours before this occurred, however I think this is unlikely to be the cause.  I would like to add that the patient lost her husband July 2019 and is still grieving, there have been other psychosocial  triggers and stressors.  She was having severe flank pain, finally had a fall in December 2019 when she tripped over her vacuum cleaner , hit her head, and went to the ED. She was found to have kidney stones, and a mass on the left ovary- she had not followed up- has only now made her GYN appointment.    She remains on Keppra at the time of the spells.  There is no way that Keppra could be used as a quick acting intervention but for this we can use a benzodiazepine that should dissolve under the tongue or in the buccal mucosa. Today she reports not having had "seizures " in a while- 3 weeks.   08-19-2017, RV . [name] is a 53 year old Caucasian female patient, recently widowed ( 08-11-2017) after 33 years of marriage. She is grieving, her husband had CHF and PE.  With the passing of her husband her spells have increased, her stress level has increased.  In her last visit with Vaughan Browner here in the office be referred to [facility] for an EMU admission. She had one day in EMU - May 1st 2019. Her daughter reports that after the EEG was removed she had the 'big seizures " and [facility]. She had no family member with her - and that may have been an added difficulty.  Referral to psychology/ psychiatry . I explained that I need not  to follow up with her, as I have nothing else to offer and her insomnia is secondary to pain.   Report from [place] - Dr. Coralyn Mark. 05-12-2017,  53 y.o. female w/ above documented PMHx/PSHx who presents to the ED for seizure like activity. MD that presented with pt was concerned about grand mal activity however pt had no tongue biting, no incontinence and no postictal confusion. She was tired but may also now be 2/2 ativan administration. In any case, pt has a hx on recent EMU admission of having had non- epileptic spells and feel that in the setting of her rapid improvement after mx grand mal seizure that this is more likely to be a non epileptic  spell than seizures concerning for status since-  she otherwise reports feeling at her baseline (minus the fatigue). She reports compliance with her home AED regimen.  In the absence of fevers, HA, neck pain or any other unexpected finding after a seizure, since she has a hx of seizures, will defer further evaluation at this time--including no bloodwork or CT imaging.  Pt was observed and after resting for a while reported feeling at her baseline.  She reports that the recent stress of her husbands illness is the likely trigger for her spells.  She was able to ambulate and tolerate PO without difficulty. They are amendable to d/c and have no other complaints or concerns. Usual and customary return precautions for seizure like activity was discussed with PT. All questions answered. PT stated understanding and agreement with plan. Encouraged PCP follow up as needed and adherence to seizure precautions. Pt and VS stable at d/c. Patient went to ED on 07-12-2017 once more , was briefly observed and released.    MM- The patient continues to have seizure events.  It is unclear whether these are always epileptic events or pseudoseizures.  I will try increasing Keppra to thousand milligrams in the morning and 1500 mg in the evening.  If this causes excessive drowsiness she will let us know.  She will remain on Lamictal 150 mg twice a day.  I will check blood work today.  The patient will be referred to [facility] for possible EMU admission.  The patient's last EMU admit was in 2004 and it demonstrated both epileptic events as well as pseudoseizures.  I will send the patient for reevaluation.  Patient is amenable to this plan.  She will call if her symptoms worsen.   32 -28-2018, [name] has recently been prescribed trazodone to help her sleep per her psychiatrist. She has seen Dr. Jannifer Franklin for intractable seizures and a visit about 2 months ago. She continues to take Lamictal, she continues to take  Celexa. 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 She reports it is a pain that keeps her from sleeping now. Dr. Maureen Ralphs treats her for her orthopedic symptoms, she also reports that she tried melatonin which has failed, has tried Ambien which causes parasomnia. Her psychiatrist is weaning her off Klonopin. Her psychiatrist in [place]  is weaning her off- she has been taking it for 7 years, started under Dr Erling Cruz.  I explained that I need not to follow up with her, as I have nothing else to offer and her insomnia is secondary to pain.   HISTORY OF PRESENT ILLNESS: [name] is a 53 year old female with a history of seizures, for years was Dr. Bernardo Heater patient.  She reports that she  had a seizure last week, Saturday.  She states that she's been taking Keppra and Lamictal consistently excpet for Friday night- the missed dose may have provoked the seizure. . Denies missing any medication. She states that after hitting her head she suffered a headache for 2 days however that has resolved. The patient does not operate a motor vehicle. At home she is able to complete all ADLs independent. She is no longer on narcotic medication. She reports that she will be having a second  knee replacement in 4 month. Mr. Braddy has done well with her last full knee replacement in April of this year, she had a hip replacement already 17 years ago, both by Dr.alusio. [name] reports that she has insomnia problems. There is also a report that her husband is very hard of hearing to the TV is very loud during the day and communication at times difficult. She has regular bedtimes between 9 and 10 PM, she will fall asleep at she will wake up around 2 AM and has great difficulties to resume sleep. She could use melatonin 3 mg at night as a natural supplement to help her stay asleep longer, if this should be to no avail I would consider a low-dose of Klonopin. In a seizure patient I would  not like to use Ambien as most benzodiazepines have at least an additional seizure controlling affect. She also has a history of parasomnia / sleepwalking. She usually sleeps on her side, she sleeps on one pillow. The bedroom is cool, quiet and dark. She has a TV in the bedroom but cuts it off when she goes to bed. She is not sure what wakes her at 2 in the morning but it seems to be in early morning ritual and is very difficult for her to reinitiate sleep. Since her husband has noted her to snore but we have no reliable source reporting apnea, I will order an attended sleep study to test her.   HISTORY 02/01/2015 (MM): Ms. [name] is a 47 female with a history of seizures. She returns today for an evaluation. The patient had several seizures and was eventually taken to the emergency room. The patient did improve once at the emergency room. She did not have to be intubated. Her Keppra was increased to 1000 mg twice a day. The patient continues on Lamictal. Since then the patient has not had any additional seizures. The patient's sister-in-law brought to my attention in private that she feels that the patient is taking pain medicine inappropriately. She is wondering if this was the cause of some of her symptoms/seizures when she went to the emergency room. She states that her physicians continue to give her pain medicine for her knee pain even after they have called and requested that she not be given this. Patient reports that she is able to complete ADLs independently. Patient does report ongoing depression. In the past she has been asked to follow-up with mental health however the patient has failed to do this. She is currently on Celexa.She denies any new neurological symptoms. She returns today for an evaluation.  HISTORY 08/21/14 [facility]):  [name] is here today for follow-up on her subjective memory complaints. A [place] cognitive assessment test was performed today in which she failed on  the Trail Making Test and was unable to copy a cube. The contour of her clock was also father droopy but she placed the numbers and hands correctly. She was able to name objects she did very  well in all parts of attention testing including serial sevens and repeated the complicated sentences her word fluency however was limited she only generated 8 words she was good as obstruction and fully oriented to place time and date her delayed recall testing showed 4 out of 5 delayed words to be correctly remembered this is a very good result she scored 26 out of 30 points. Patient is high school educated and this would be an normal score at 26-30.  We discussed anecdotal evidence for her spells- her husband reported that a couple of times they have been to the grocery store and she needed to sit down for moment she seems to be mostly claustrophobic when surrounded by a lot of people. This may be more of an anxiety attack. Reports some insomnia, mostly related to being worried about her meanwhile 47 year old son. When she hasn't had a good night sleep she is more likely to be hypersensitive to claustrophobia or to other spells of anxiety or panic. No other "seizures " on the current medication.   REVIEW OF SYSTEMS: Out of a complete 14 system review of symptoms, the patient complains only of the following symptoms, dizziness, seizure, tremors and all other reviewed systems are negative.   ALLERGIES: Allergies  Allergen Reactions   Acetaminophen Other (See Comments)    Has fatty deposits on liver   Benadryl [Diphenhydramine Hcl] Other (See Comments)    hyperactivity and seizures   Codeine Itching and Rash    seizures   Dilantin [Phenytoin Sodium Extended] Other (See Comments)    Elevated LFT's   Melatonin Other (See Comments)    seizures   Tramadol Other (See Comments)    "causes seizures"   Ultram [Tramadol Hcl] Other (See Comments)    Seizures   Vimpat [Lacosamide] Other (See Comments)     Severe dizziness   Mirtazapine Other (See Comments)    Potentiated seizure activity   Betadine [Povidone Iodine] Rash   Latex Rash   Penicillins Itching and Rash    Has patient had a PCN reaction causing immediate rash, facial/tongue/throat swelling, SOB or lightheadedness with hypotension: No Has patient had a PCN reaction causing severe rash involving mucus membranes or skin necrosis: No Has patient had a PCN reaction that required hospitalization No Has patient had a PCN reaction occurring within the last 10 years: No If all of the above answers are "NO", then may proceed with Cephalosporin use.   Sulfa Antibiotics Rash   Tape Rash and Other (See Comments)    Paper tape please Paper tape please   Vicodin [Hydrocodone-Acetaminophen] Itching    HOME MEDICATIONS: Outpatient Medications Prior to Visit  Medication Sig Dispense Refill   FLUoxetine (PROZAC) 40 MG capsule Take 2 capsules (80 mg total) by mouth daily. 180 capsule 0   gabapentin (NEURONTIN) 600 MG tablet Take 1 tablet (600 mg total) by mouth 3 (three) times daily. 270 tablet 3   LORazepam (ATIVAN) 1 MG tablet Take 1 tablet (1 mg total) by mouth daily as needed for anxiety. 30 tablet 2   pramipexole (MIRAPEX) 0.5 MG tablet Take 1 tablet by mouth twice daily 60 tablet 5   lamoTRIgine (LAMICTAL) 150 MG tablet Take 1 tablet (150 mg total) by mouth 2 (two) times daily. For mood control 60 tablet 11   levETIRAcetam (KEPPRA) 1000 MG tablet Take 1 in the morning and 1.5 tablet at bedtime. 75 tablet 5   hydrOXYzine (ATARAX/VISTARIL) 25 MG tablet Take 1 tablet (25 mg total) by mouth  2 (two) times daily as needed for anxiety. 180 tablet 0   No facility-administered medications prior to visit.     PAST MEDICAL HISTORY: Past Medical History:  Diagnosis Date   Arthritis    "knees" (04/22/2012)   Chronic bronchitis ([place])    "yearly; when the weather changes" (04/22/2012)   Colon polyp    Colon polyps    adenomatous  and hyperplastic-   Depression    Eczema    Epilepsy ([place])    "been having them right often here lately" (04/22/2012)   Fatty liver    High cholesterol    History of kidney stones    Osteoarthritis    /notes 04/22/2012   Other convulsions 05/21/12   non-epileptic spells   Restless leg    Seizures ([place])    Vertigo     PAST SURGICAL HISTORY: Past Surgical History:  Procedure Laterality Date   ABDOMINAL HYSTERECTOMY  2001   BLADDER SUSPENSION     BUNIONECTOMY Left 2000   CESAREAN SECTION  1987; 1988   COLONOSCOPY     JOINT REPLACEMENT     MASS EXCISION  10/22/2011   Procedure: EXCISION MASS;  Surgeon: [name], MD;  Location: [facility];  Service: General;  Laterality: Right;  excision right buttock mass   TOTAL HIP ARTHROPLASTY Left 1993; 1995; 2000   TOTAL KNEE ARTHROPLASTY Left 05/07/2015   Procedure: TOTAL LEFT KNEE ARTHROPLASTY;  Surgeon: [name], MD;  Location: [facility];  Service: Orthopedics;  Laterality: Left;   TOTAL KNEE ARTHROPLASTY Right 10/01/2015   Procedure: RIGHT TOTAL KNEE ARTHROPLASTY;  Surgeon: [name], MD;  Location: [facility];  Service: Orthopedics;  Laterality: Right;    FAMILY HISTORY: Family History  Problem Relation Age of Onset   Cancer Mother    Depression Father    Cancer Brother    Diabetes Brother    Cancer Maternal Aunt    Diabetes Maternal Aunt    Bipolar disorder Son    Drug abuse Son    Heart attack Sister 46       during severe illness    SOCIAL HISTORY: Social History   Socioeconomic History   Marital status: Widowed    Spouse name: [name]   Number of children: 2   Years of education: 12 +   Highest education level: Not on file  Occupational History   Occupation: disability, medical  Social Needs   Financial resource strain: Not very hard   Food insecurity    Worry: Never true    Inability: Never true   Transportation needs    Medical: No    Non-medical: No  Tobacco Use    Smoking status: Never Smoker   Smokeless tobacco: Never Used  Substance and Sexual Activity   Alcohol use: No    Alcohol/week: 0.0 standard drinks    Comment: 12-25-2015 per pt no   Drug use: No    Comment: 21-12-2015 per pt no    Sexual activity: Not Currently    Birth control/protection: Surgical  Lifestyle   Physical activity    Days per week: 7 days    Minutes per session: 60 min   Stress: Rather much  Relationships   Social connections    Talks on phone: More than three times a week    Gets together: More than three times a week    Attends religious service: More than 4 times per year    Active member of club or organization: No  Attends meetings of clubs or organizations: Not on file    Relationship status: Widowed   Intimate partner violence    Fear of current or ex partner: No    Emotionally abused: No    Physically abused: No    Forced sexual activity: No  Other Topics Concern   Not on file  Social History Narrative   Patient has two adult children.   Patient is disabled.   Patient has a high school education and some trade school.   Patient is right-handed.   Patient drinks four glasses of soda and tea daily.      PHYSICAL EXAM  Vitals:   08/23/18 1453  BP: 110/75  Pulse: 71  Temp: 98 F (36.7 C)  Weight: 161 lb 9.6 oz (73.3 kg)  Height: 5' (1.524 m)   Body mass index is 31.56 kg/m.  Generalized: Well developed, in no acute distress  Cardiology: normal rate and rhythm, no murmur noted Neurological examination  Mentation: Alert oriented to time, place, history taking. Follows all commands speech and language fluent Cranial nerve II-XII: Pupils were equal round reactive to light. Extraocular movements were full, visual field were full on confrontational test. Facial sensation and strength were normal. Uvula tongue midline. Head turning and shoulder shrug  were normal and symmetric. Motor: The motor testing reveals 5 over 5 strength of all  4 extremities. Good symmetric motor tone is noted throughout.  Coordination: Cerebellar testing reveals good finger-nose-finger and heel-to-shin bilaterally.  Gait and station: Gait is normal.    DIAGNOSTIC DATA (LABS, IMAGING, TESTING) - I reviewed patient records, labs, notes, testing and imaging myself where available.  MMSE - Mini Mental State Exam 02/20/2014  Orientation to time 5  Orientation to Place 5  Registration 3  Attention/ Calculation 5  Recall 3  Language- name 2 objects 2  Language- repeat 1  Language- follow 3 step command 3  Language- read & follow direction 1  Write a sentence 1  Copy design 1  Total score 30     Lab Results  Component Value Date   WBC 5.8 06/15/2018   HGB 10.9 (L) 06/15/2018   HCT 34.0 (L) 06/15/2018   MCV 92.4 06/15/2018   PLT 176 06/15/2018      Component Value Date/Time   NA 144 08/23/2018 1529   K 4.3 08/23/2018 1529   CL 104 08/23/2018 1529   CO2 26 08/23/2018 1529   GLUCOSE 89 08/23/2018 1529   GLUCOSE 103 (H) 06/15/2018 2146   BUN 13 08/23/2018 1529   CREATININE 0.79 08/23/2018 1529   CALCIUM 9.7 08/23/2018 1529   PROT 6.6 08/23/2018 1529   ALBUMIN 4.5 08/23/2018 1529   AST 182 (H) 08/23/2018 1529   ALT 238 (H) 08/23/2018 1529   ALKPHOS 74 08/23/2018 1529   BILITOT 0.3 08/23/2018 1529   GFRNONAA 86 08/23/2018 1529   GFRAA 99 08/23/2018 1529   Lab Results  Component Value Date   CHOL 151 09/21/2017   HDL 53.10 09/21/2017   LDLCALC 73 09/21/2017   TRIG 124.0 09/21/2017   CHOLHDL 3 09/21/2017   Lab Results  Component Value Date   HGBA1C 5.4 11/28/2015   Lab Results  Component Value Date   VITAMINB12 515 06/24/2011   Lab Results  Component Value Date   TSH 1.52 09/21/2017     ASSESSMENT AND PLAN 53 y.o. year old female  has a past medical history of Arthritis, Chronic bronchitis ([employer]), Colon polyp, Colon polyps, Depression, Eczema, Epilepsy ([place]),  Fatty liver, High cholesterol, History of kidney stones,  Osteoarthritis, Other convulsions (05/21/12), Restless leg, Seizures ([place]), and Vertigo. here with     ICD-10-CM   1. 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  She reports having a seizure last on July 4.  Event sounds more like panic attack in description. We will continue current treatment regimen as I do feel that this may be helping with her mood.  I will update labs today.  Patient was noted to have elevated liver enzymes in the past.  There is a history of fatty liver.  Unfortunately she does continue to grieve the loss of her husband.  She denies any suicidal ideations. She was encouraged to continue close follow-up with psychiatry and primary care. She will follow up with neurology in 1 year.    Orders Placed This Encounter  Procedures   Lamotrigine level   CMP     Meds ordered this encounter  Medications   levETIRAcetam (KEPPRA) 1000 MG tablet    Sig: Take 1 in the morning and 1.5 tablet at bedtime.    Dispense:  75 tablet    Refill:  11    Order Specific Question:   Supervising Provider    Answer:   Melvenia Beam [[id number]]   lamoTRIgine (LAMICTAL) 150 MG tablet    Sig: Take 1 tablet (150 mg total) by mouth 2 (two) times daily. For mood control    Dispense:  60 tablet    Refill:  11    Order Specific Question:   Supervising Provider    Answer:   Melvenia Beam [id number]      I spent 15 minutes with the patient. 50% of this time was spent counseling and educating patient on plan of care and medications.    Debbora Presto, FNP-C 08/24/2018, 12:45 PM [facility] [address], [place], [place] [address] [phone]

## 2018-08-24 ENCOUNTER — Telehealth: Payer: Self-pay | Admitting: *Deleted

## 2018-08-24 ENCOUNTER — Encounter: Payer: Self-pay | Admitting: Family Medicine

## 2018-08-24 LAB — COMPREHENSIVE METABOLIC PANEL
ALT: 238 IU/L — ABNORMAL HIGH (ref 0–32)
AST: 182 IU/L — ABNORMAL HIGH (ref 0–40)
Albumin/Globulin Ratio: 2.1 (ref 1.2–2.2)
Albumin: 4.5 g/dL (ref 3.8–4.9)
Alkaline Phosphatase: 74 IU/L (ref 39–117)
BUN/Creatinine Ratio: 16 (ref 9–23)
BUN: 13 mg/dL (ref 6–24)
Bilirubin Total: 0.3 mg/dL (ref 0.0–1.2)
CO2: 26 mmol/L (ref 20–29)
Calcium: 9.7 mg/dL (ref 8.7–10.2)
Chloride: 104 mmol/L (ref 96–106)
Creatinine, Ser: 0.79 mg/dL (ref 0.57–1.00)
GFR calc Af Amer: 99 mL/min/{1.73_m2} (ref >59)
GFR calc non Af Amer: 86 mL/min/{1.73_m2} (ref >59)
Globulin, Total: 2.1 g/dL (ref 1.5–4.5)
Glucose: 89 mg/dL (ref 65–99)
Potassium: 4.3 mmol/L (ref 3.5–5.2)
Sodium: 144 mmol/L (ref 134–144)
Total Protein: 6.6 g/dL (ref 6.0–8.5)

## 2018-08-24 LAB — LAMOTRIGINE LEVEL: Lamotrigine Lvl: 9.8 ug/mL (ref 2.0–20.0)

## 2018-08-24 NOTE — Telephone Encounter (Signed)
-----   Message from Debbora Presto, NP sent at 08/24/2018  1:57 PM EDT ----- Please call her PCP office and let them know that we will be faxing a copy of her labs over urgent review. Her liver enzymes are elevated and have worsened in the past month. There have been no changes in medication that we are aware of. She does have a history of fatty liver. She may need to be seen for close follow up with PCP or hepatologist if PCP feels this is necessary. We are going to continue Keppra and Lamictal for now but may have to adjust dose if liver enzymes continue to remain elevated. Will assess Lamictal levels as soon as labs received.

## 2018-08-24 NOTE — Telephone Encounter (Signed)
I called and spoke to [place], daughter in law (ok per DPR) that pts lab results per AL/NP show elevated liver enzymes.  Higher then when last in hosptial 6/20.  Will forward to her pcp Dr. Dierdre Forth to make them aware.  Do not have other sz med level but will call when available.  She is to continue same dosing as ordered.  Lawerance Bach states that pt takes other drugs,  recreational drugs which may account for elevated liver enzymes.  She will let pts mother know of this.

## 2018-08-25 ENCOUNTER — Ambulatory Visit (HOSPITAL_COMMUNITY): Payer: PPO | Admitting: Licensed Clinical Social Worker

## 2018-08-25 ENCOUNTER — Telehealth (HOSPITAL_COMMUNITY): Payer: Self-pay | Admitting: Licensed Clinical Social Worker

## 2018-08-25 ENCOUNTER — Other Ambulatory Visit: Payer: Self-pay

## 2018-08-25 ENCOUNTER — Telehealth: Payer: Self-pay | Admitting: Neurology

## 2018-08-25 NOTE — Telephone Encounter (Signed)
Please have patient schedule an office with me to recheck liver tests in 2-4 weeks. Thanks.

## 2018-08-25 NOTE — Telephone Encounter (Signed)
Called the patient and made her aware of the lab work results. Informed that it was normal and there was no concerns. Pt verbalized understanding. Pt had no questions at this time but was encouraged to call back if questions arise.

## 2018-08-25 NOTE — Telephone Encounter (Signed)
Called the number. Someone answered, and I asked for [name] then I was hung up on. I called back and no answer.

## 2018-08-25 NOTE — Telephone Encounter (Signed)
-----   Message from Darleen Crocker, RN sent at 08/25/2018  2:17 PM EDT -----  ----- Message ----- From: Debbora Presto, NP Sent: 08/25/2018   7:52 AM EDT To: Brandon Melnick, RN  lamictal levels normal.

## 2018-08-25 NOTE — Telephone Encounter (Signed)
Called to schedule appt with Dr. Jonni Sanger in 2-4 weeks, no answer, unable to leave message

## 2018-08-30 ENCOUNTER — Ambulatory Visit (HOSPITAL_COMMUNITY): Payer: PPO | Admitting: Psychiatry

## 2018-09-03 ENCOUNTER — Telehealth: Payer: Self-pay | Admitting: Family Medicine

## 2018-09-03 NOTE — Telephone Encounter (Signed)
Called the patient to schedule AWV with Loma Sousa.  There was no answer and no option to leave a message on the home number.  I called the mobile number and was told that it's the wrong number. VDM (Dee-Dee)

## 2018-09-08 ENCOUNTER — Encounter (HOSPITAL_COMMUNITY): Payer: Self-pay | Admitting: Licensed Clinical Social Worker

## 2018-09-08 ENCOUNTER — Other Ambulatory Visit: Payer: Self-pay

## 2018-09-08 ENCOUNTER — Ambulatory Visit (INDEPENDENT_AMBULATORY_CARE_PROVIDER_SITE_OTHER): Payer: PPO | Admitting: Licensed Clinical Social Worker

## 2018-09-08 DIAGNOSIS — F330 Major depressive disorder, recurrent, mild: Secondary | ICD-10-CM

## 2018-09-08 NOTE — Progress Notes (Signed)
Virtual Visit via Telephone Note  I connected with [name] on 09/08/18 at 10:00 AM EDT by a telephone enabled telemedicine application and verified that I am speaking with the correct person using two identifiers.  Location: Patient: Home Provider: Office   I discussed the limitations of evaluation and management by telemedicine and the availability of in person appointments. The patient expressed understanding and agreed to proceed.   THERAPIST PROGRESS NOTE  Session Time: 10:00 am-10:45 am  Participation Level: Active  Behavioral Response: CasualAlertDepressed  Type of Therapy: Individual Therapy  Treatment Goals addressed: Coping  Interventions: CBT and Solution Focused  Summary: [name] is a 53 y.o. female who presents oriented x5 (person, place, situation, time, and object), casually dressed, appropriately groomed, average height, average weight, and cooperative to address mood. Patient has a history of medical treatment including epilepsy, insomnia, and sleep apnea. Patient has a history of mental health treatment including outpatient therapy and medication management. Patient denies suicidal and homicidal ideations. Patient denies psychosis including auditory and visual hallucinations. Patient denies substance abuse. Patient is at low risk of lethality at this time.  Physically: Patient has only had 2 seizures. Patient has had an increase in her medication Prozac. She has had an increase in appetite. Patient is worried about gaining weight.   Spiritually/values: Patient continues to be faithful. She continues watch tv preaching.  Relationships: Patient is getting along with others. Patient feels like his her son is over protective at times.  Emotionally/Mentally/Behavior: Patient is feeling better. She is working on her flower bed. She has been spending time with his dog and spends time with her mother. Patient is working on cleaning out her building. Patient got  a $25,000 settlement. Patient is trying to move forward from her spouse's passing. She is talking to someone on a dating site and her son felt like she is trying to replace her spouse.   Patient engaged in session. She responded well to interventions. 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 Patient will continue in outpatient therapy due to being the least restrictive service to meet her needs. Patient made minimal progress on her goals    Suicidal/Homicidal: Negativewithout intent/plan  Therapist Response: Therapist reviewed patient's recent thoughts and behaviors. Therapist utilized CBT to address mood. Therapist discussed patient's thoughts to identify triggers for mood. Therapist discussed patient "moving forward" after her spouse passed away.   Plan: Return again in 4 weeks.  Diagnosis: Axis I: Major dperessive disorder, recurrent episode, mild    Axis II: No diagnosis  I discussed the assessment and treatment plan with the patient. The patient was provided an opportunity to ask questions and all were answered. The patient agreed with the plan and demonstrated an understanding of the instructions.   The patient was advised to call back or seek an in-person evaluation if the symptoms worsen or if the condition fails to improve as anticipated.  I provided 40 minutes of non-face-to-face time during this encounter.   Glori Bickers, LCSW 09/08/2018

## 2018-09-14 ENCOUNTER — Other Ambulatory Visit: Payer: Self-pay | Admitting: Neurology

## 2018-10-12 ENCOUNTER — Ambulatory Visit (INDEPENDENT_AMBULATORY_CARE_PROVIDER_SITE_OTHER): Payer: PPO | Admitting: Licensed Clinical Social Worker

## 2018-10-12 ENCOUNTER — Encounter (HOSPITAL_COMMUNITY): Payer: Self-pay | Admitting: Licensed Clinical Social Worker

## 2018-10-12 ENCOUNTER — Other Ambulatory Visit: Payer: Self-pay

## 2018-10-12 DIAGNOSIS — F330 Major depressive disorder, recurrent, mild: Secondary | ICD-10-CM

## 2018-10-12 NOTE — Progress Notes (Signed)
Virtual Visit via Telephone Note  I connected with [name] on 10/12/18 at 11:00 AM EDT by a telephone enabled telemedicine application and verified that I am speaking with the correct person using two identifiers.  Location: Patient: Home Provider: Office   I discussed the limitations of evaluation and management by telemedicine and the availability of in person appointments. The patient expressed understanding and agreed to proceed.   THERAPIST PROGRESS NOTE  Session Time: 11:00 am-11:45 am  Participation Level: Active  Behavioral Response: CasualAlertDepressed  Type of Therapy: Individual Therapy  Treatment Goals addressed: Coping  Interventions: CBT and Solution Focused  Summary: [name] is a 53 y.o. female who presents oriented x5 (person, place, situation, time, and object), casually dressed, appropriately groomed, average height, average weight, and cooperative to address mood. Patient has a history of medical treatment including epilepsy, insomnia, and sleep apnea. Patient has a history of mental health treatment including outpatient therapy and medication management. Patient denies suicidal and homicidal ideations. Patient denies psychosis including auditory and visual hallucinations. Patient denies substance abuse. Patient is at low risk of lethality at this time.  Physically: Patient has only had 3 seizures over the last few weeks. She said her medication "got off track" but she is back on track. She is feeling a little better.    Spiritually/values: Patient continues to be faithful. She continues watch tv preaching.  Relationships: Patient is getting along with others. She watches "3 youngins" who are doing school work that get on her nerves at times. She is able to manage due to the children going home after a few hours.   Emotionally/Mentally/Behavior: Patient is feeling better. She continues to work on cleaning out her storage. She feels like she is ready  to get ride of the majority of her husbands items but has kept a few items of his. Patient noted that she needs to start writing to help with her mood and committed to start writing again.   Patient engaged in session. She responded well to interventions. 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 Patient will continue in outpatient therapy due to being the least restrictive service to meet her needs. Patient made minimal progress on her goals    Suicidal/Homicidal: Negativewithout intent/plan  Therapist Response: Therapist reviewed patient's recent thoughts and behaviors. Therapist utilized CBT to address mood. Therapist discussed patient's thoughts to identify triggers for mood. Therapist discussed patient managing her mood through writing and managing her health.   Plan: Return again in 4 weeks.  Diagnosis: Axis I: Major dperessive disorder, recurrent episode, mild    Axis II: No diagnosis  I discussed the assessment and treatment plan with the patient. The patient was provided an opportunity to ask questions and all were answered. The patient agreed with the plan and demonstrated an understanding of the instructions.   The patient was advised to call back or seek an in-person evaluation if the symptoms worsen or if the condition fails to improve as anticipated.  I provided 30 minutes of non-face-to-face time during this encounter.   Glori Bickers, LCSW 10/12/2018

## 2018-10-15 ENCOUNTER — Ambulatory Visit: Payer: Self-pay | Admitting: *Deleted

## 2018-10-15 NOTE — Telephone Encounter (Signed)
FYI

## 2018-10-15 NOTE — Telephone Encounter (Signed)
Patient reports she had 2 seizures this week and had multiple falls. She hit the back of her head and also hit her jaw. Patient is concerned about the fall she had where she hit her jaw- this was due to tripping over the dog. Advised patient that if she can not open her mouth without pain and she is still having swelling since Monday- she needs to be seen at ED for her injury.  Patient is concerned about transportation- but she will try to find someone to drive her. Patient informed PCP would be notified.  Reason for Disposition . Closing the mouth and biting down does not feel normal  Answer Assessment - Initial Assessment Questions 1. MECHANISM: "How did the injury happen?"      Fall over dog- tangled in lease 2. ONSET: "When did the injury happen?" (Minutes or hours ago)     Jaw injury- Sunday 3. LOCATION: "What part of the face is injured?"     L side of face/jaw 4. APPEARANCE of INJURY: "What does the face look like?"     Swelling has not gone away 5. BLEEDING: "Is it bleeding now?" If so, ask, "Is it difficult to stop?"     No bleeding in mouth 6. PAIN: "Is there pain?" If so, ask: "How bad is the pain?"  (e.g., Scale 1-10; or mild, moderate, severe)     5- comes and goes- hurts when eating 7. SIZE: For cuts, bruises, or swelling, ask: "How large is it?" (e.g., inches or centimeters)      Swelling in jaw- puffy and feels sore in the mouth 8. TETANUS: For any breaks in the skin, ask: "When was the last tetanus booster?"     n/a 9. OTHER SYMPTOMS: "Do you have any other symptoms?" (e.g., neck pain, headache, loss of consciousness)     headaches and some seizures-2 since the fall 10. PREGNANCY: "Is there any chance you are pregnant?" "When was your last menstrual period?"       n/a  Protocols used: FACE INJURY-A-AH

## 2018-10-19 ENCOUNTER — Ambulatory Visit (INDEPENDENT_AMBULATORY_CARE_PROVIDER_SITE_OTHER): Payer: PPO

## 2018-10-19 ENCOUNTER — Ambulatory Visit (INDEPENDENT_AMBULATORY_CARE_PROVIDER_SITE_OTHER): Payer: PPO | Admitting: Family Medicine

## 2018-10-19 ENCOUNTER — Ambulatory Visit: Payer: Self-pay | Admitting: *Deleted

## 2018-10-19 ENCOUNTER — Other Ambulatory Visit: Payer: Self-pay

## 2018-10-19 ENCOUNTER — Encounter: Payer: Self-pay | Admitting: Family Medicine

## 2018-10-19 VITALS — BP 125/76 | HR 70 | Temp 97.2°F | Ht 60.0 in | Wt 162.2 lb

## 2018-10-19 DIAGNOSIS — R6884 Jaw pain: Secondary | ICD-10-CM

## 2018-10-19 DIAGNOSIS — M549 Dorsalgia, unspecified: Secondary | ICD-10-CM

## 2018-10-19 DIAGNOSIS — S0993XA Unspecified injury of face, initial encounter: Secondary | ICD-10-CM | POA: Diagnosis not present

## 2018-10-19 MED ORDER — METHYLPREDNISOLONE ACETATE 80 MG/ML IJ SUSP
80.0000 mg | Freq: Once | INTRAMUSCULAR | Status: AC
  Administered 2018-10-19: 80 mg via INTRAMUSCULAR

## 2018-10-19 MED ORDER — DICLOFENAC SODIUM 75 MG PO TBEC: 75.0000 mg | DELAYED_RELEASE_TABLET | Freq: Two times a day (BID) | ORAL | 0 refills | Status: DC

## 2018-10-19 MED ORDER — TIZANIDINE HCL 4 MG PO TABS: 4.0000 mg | ORAL_TABLET | Freq: Four times a day (QID) | ORAL | 0 refills | Status: DC | PRN

## 2018-10-19 MED ORDER — MELOXICAM 7.5 MG PO TABS: 7.5000 mg | ORAL_TABLET | Freq: Every day | ORAL | 0 refills | Status: DC

## 2018-10-19 NOTE — Addendum Note (Signed)
Addended by: Loralyn Freshwater on: 10/19/2018 05:11 PM   Modules accepted: Orders

## 2018-10-19 NOTE — Telephone Encounter (Signed)
[place] office, schedule patient to be seen.  Clinical team, be advised.

## 2018-10-19 NOTE — Progress Notes (Signed)
   Chief Complaint:  [name] is a 53 y.o. female who presents for same day appointment with a chief complaint of back pain.   Assessment/Plan:  Back Pain / Jaw Pain No red flags.  X-ray today without obvious fracture based on my read.  Will await radiology read.  Likely has muscular strain/spasm related to her recent fall and seizure.  We will give 80 mg of IM Depo-Medrol today.  Will start tizanidine and diclofenac for the next several days.  Discussed home exercises.  Discussed reasons to return to care.  Follow-up as needed.     Subjective:  HPI:  Back Pain, acute problem Started about about a week ago.  Patient was on her deck when she had a seizure and fell backwards onto the left side of her back and her left face.  Since then she has had persistent left lower back pain and left jaw pain.  She has tried using ice with no significant improvement.  Pain is worse with certain motions.  She is also had difficulty opening her mouth due to jaw pain.  No medications tried.  Pain seems to be worsening.  No bowel or bladder incontinence.  No weakness or numbness. No other obvious alleviating or aggravating factors.    ROS: Per HPI  PMH: She reports that she has never smoked. She has never used smokeless tobacco. She reports that she does not drink alcohol or use drugs.      Objective:  Physical Exam: BP 125/76   Pulse 70   Temp (!) 97.2 F (36.2 C)   Ht 5' (1.524 m)   Wt 162 lb 3.2 oz (73.6 kg)   SpO2 98%   BMI 31.68 kg/m   Gen: NAD, resting comfortably HEENT: Left mandible tender palpation along distal third portion Back: No deformities.  Tender to palpation along left lower lumbar paraspinal muscles. Legs: No deformities.  Strength 5 out of 5 throughout.  Neurovascular intact distally.     Algis Greenhouse. Jerline Pain, MD 10/19/2018 3:42 PM

## 2018-10-19 NOTE — Telephone Encounter (Signed)
Patient calling with back spasms and jaw pain - same as last week. Patient called last week with disposition of ED- she states she does not drive and her mother is her only transportation- she states she can come to the office. Advised patient that her PCP is only working half day today and she would need to have approval to be seen by another provider. Per office please send note and will have provider review. Please contact patient at number in chart- or mother's number [phone]  Reason for Disposition . High-risk adult (e.g., history of cancer, HIV, or IV drug abuse)    Patient is epileptic and has been having seizures causing her to fall and injure herself.  Answer Assessment - Initial Assessment Questions 1. ONSET: "When did the pain begin?"      Patient is having back spasms 2. LOCATION: "Where does it hurt?" (upper, mid or lower back)     Lower back 3. SEVERITY: "How bad is the pain?"  (e.g., Scale 1-10; mild, moderate, or severe)   - MILD (1-3): doesn't interfere with normal activities    - MODERATE (4-7): interferes with normal activities or awakens from sleep    - SEVERE (8-10): excruciating pain, unable to do any normal activities      6- with spasm 4. PATTERN: "Is the pain constant?" (e.g., yes, no; constant, intermittent)      Comes and goes- several times/day 5. RADIATION: "Does the pain shoot into your legs or elsewhere?"     No radiation 6. CAUSE:  "What do you think is causing the back pain?"      Patient thinks she hurt back with falls- Sunday 7. BACK OVERUSE:  "Any recent lifting of heavy objects, strenuous work or exercise?"    no 8. MEDICATIONS: "What have you taken so far for the pain?" (e.g., nothing, acetaminophen, NSAIDS)     nothing 9. NEUROLOGIC SYMPTOMS: "Do you have any weakness, numbness, or problems with bowel/bladder control?"     no 10. OTHER SYMPTOMS: "Do you have any other symptoms?" (e.g., fever, abdominal pain, burning with urination, blood in  urine)       Jaw pain 11. PREGNANCY: "Is there any chance you are pregnant?" (e.g., yes, no; LMP)       n/a  Protocols used: BACK PAIN-A-AH

## 2018-10-19 NOTE — Patient Instructions (Signed)
It was very nice to see you today!  Think you are having a strain and spasm in your lower back.  We will give you a shot of steroids today.  Please also start the muscle relaxer and the anti-inflammatory.  You can use heating pads as needed.  Please let me know or let Dr. Jonni Sanger know if your symptoms worsen or do not improve the next several days.  Take care, Dr Jerline Pain

## 2018-10-25 NOTE — Progress Notes (Deleted)
[facility] MD/PA/NP OP Progress Note  10/25/2018 12:51 PM [name]  MRN:  [id number]  Chief Complaint:  HPI: *** Visit Diagnosis: No diagnosis found.  Past Psychiatric History: Please see initial evaluation for full details. I have reviewed the history. No updates at this time.     Past Medical History:  Past Medical History:  Diagnosis Date  . Arthritis    "knees" (04/22/2012)  . Chronic bronchitis ([place])    "yearly; when the weather changes" (04/22/2012)  . Colon polyp   . Colon polyps    adenomatous and hyperplastic-  . Depression   . Eczema   . Epilepsy ([place])    "been having them right often here lately" (04/22/2012)  . Fatty liver   . High cholesterol   . History of kidney stones   . Osteoarthritis    [name] 04/22/2012  . Other convulsions 05/21/12   non-epileptic spells  . Restless leg   . Seizures ([place])   . Vertigo     Past Surgical History:  Procedure Laterality Date  . ABDOMINAL HYSTERECTOMY  2001  . BLADDER SUSPENSION    . BUNIONECTOMY Left 2000  . CESAREAN SECTION  1987; 1988  . COLONOSCOPY    . JOINT REPLACEMENT    . MASS EXCISION  10/22/2011   Procedure: EXCISION MASS;  Surgeon: [name], MD;  Location: [facility];  Service: General;  Laterality: Right;  excision right buttock mass  . TOTAL HIP ARTHROPLASTY Left 1993; 1995; 2000  . TOTAL KNEE ARTHROPLASTY Left 05/07/2015   Procedure: TOTAL LEFT KNEE ARTHROPLASTY;  Surgeon: [name], MD;  Location: [facility];  Service: Orthopedics;  Laterality: Left;  . TOTAL KNEE ARTHROPLASTY Right 10/01/2015   Procedure: RIGHT TOTAL KNEE ARTHROPLASTY;  Surgeon: [name], MD;  Location: [facility];  Service: Orthopedics;  Laterality: Right;    Family Psychiatric History: Please see initial evaluation for full details. I have reviewed the history. No updates at this time.     Family History:  Family History  Problem Relation Age of Onset  . Cancer Mother   . Depression Father   . Cancer Brother   . Diabetes  Brother   . Cancer Maternal Aunt   . Diabetes Maternal Aunt   . Bipolar disorder Son   . Drug abuse Son   . Heart attack Sister 59       during severe illness    Social History:  Social History   Socioeconomic History  . Marital status: Widowed    Spouse name: [name]  . Number of children: 2  . Years of education: 12 +  . Highest education level: Not on file  Occupational History  . Occupation: disability, medical  Social Needs  . Financial resource strain: Not very hard  . Food insecurity    Worry: Never true    Inability: Never true  . Transportation needs    Medical: No    Non-medical: No  Tobacco Use  . Smoking status: Never Smoker  . Smokeless tobacco: Never Used  Substance and Sexual Activity  . Alcohol use: No    Alcohol/week: 0.0 standard drinks    Comment: 12-25-2015 per pt no  . Drug use: No    Comment: 21-12-2015 per pt no   . Sexual activity: Not Currently    Birth control/protection: Surgical  Lifestyle  . Physical activity    Days per week: 7 days    Minutes per session: 60 min  . Stress: Rather much  Relationships  .  Social connections    Talks on phone: More than three times a week    Gets together: More than three times a week    Attends religious service: More than 4 times per year    Active member of club or organization: No    Attends meetings of clubs or organizations: Not on file    Relationship status: Widowed  Other Topics Concern  . Not on file  Social History Narrative   Patient has two adult children.   Patient is disabled.   Patient has a high school education and some trade school.   Patient is right-handed.   Patient drinks four glasses of soda and tea daily.    Allergies:  Allergies  Allergen Reactions  . Acetaminophen Other (See Comments)    Has fatty deposits on liver  . Benadryl [Diphenhydramine Hcl] Other (See Comments)    hyperactivity and seizures  . Codeine Itching and Rash    seizures  . Dilantin [Phenytoin  Sodium Extended] Other (See Comments)    Elevated LFT's  . Melatonin Other (See Comments)    seizures  . Tramadol Other (See Comments)    "causes seizures"  . Ultram [Tramadol Hcl] Other (See Comments)    Seizures  . Vimpat [Lacosamide] Other (See Comments)    Severe dizziness  . Mirtazapine Other (See Comments)    Potentiated seizure activity  . Betadine [Povidone Iodine] Rash  . Latex Rash  . Penicillins Itching and Rash    Has patient had a PCN reaction causing immediate rash, facial/tongue/throat swelling, SOB or lightheadedness with hypotension: No Has patient had a PCN reaction causing severe rash involving mucus membranes or skin necrosis: No Has patient had a PCN reaction that required hospitalization No Has patient had a PCN reaction occurring within the last 10 years: No If all of the above answers are "NO", then may proceed with Cephalosporin use.  . Sulfa Antibiotics Rash  . Tape Rash and Other (See Comments)    Paper tape please Paper tape please  . Vicodin [Hydrocodone-Acetaminophen] Itching    Metabolic Disorder Labs: Lab Results  Component Value Date   HGBA1C 5.4 11/28/2015   MPG 108 11/28/2015   No results found for: PROLACTIN Lab Results  Component Value Date   CHOL 151 09/21/2017   TRIG 124.0 09/21/2017   HDL 53.10 09/21/2017   CHOLHDL 3 09/21/2017   VLDL 24.8 09/21/2017   LDLCALC 73 09/21/2017   LDLCALC 86 07/07/2016   Lab Results  Component Value Date   TSH 1.52 09/21/2017   TSH 1.900 07/07/2016    Therapeutic Level Labs: No results found for: LITHIUM No results found for: VALPROATE No components found for:  CBMZ  Current Medications: Current Outpatient Medications  Medication Sig Dispense Refill  . BUSPIRONE HCL PO Take by mouth.    . diclofenac (VOLTAREN) 75 MG EC tablet Take 1 tablet (75 mg total) by mouth 2 (two) times daily. 30 tablet 0  . FLUoxetine (PROZAC) 40 MG capsule Take 2 capsules (80 mg total) by mouth daily. 180 capsule 0   . gabapentin (NEURONTIN) 600 MG tablet Take 1 tablet (600 mg total) by mouth 3 (three) times daily. 270 tablet 3  . lamoTRIgine (LAMICTAL) 150 MG tablet Take 1 tablet (150 mg total) by mouth 2 (two) times daily. For mood control 60 tablet 11  . levETIRAcetam (KEPPRA) 1000 MG tablet Take 1 in the morning and 1.5 tablet at bedtime. 75 tablet 11  . LORazepam (ATIVAN) 1 MG  tablet Take 1 tablet (1 mg total) by mouth daily as needed for anxiety. 30 tablet 2  . pramipexole (MIRAPEX) 0.5 MG tablet Take 1 tablet by mouth twice daily 60 tablet 5  . tiZANidine (ZANAFLEX) 4 MG tablet Take 1 tablet (4 mg total) by mouth every 6 (six) hours as needed for muscle spasms. 30 tablet 0   No current facility-administered medications for this visit.      Musculoskeletal: Strength & Muscle Tone: N/A Gait & Station: N/A Patient leans: N/A  Psychiatric Specialty Exam: ROS  There were no vitals taken for this visit.There is no height or weight on file to calculate BMI.  General Appearance: {Appearance:22683}  Eye Contact:  {BHH EYE CONTACT:22684}  Speech:  Clear and Coherent  Volume:  Normal  Mood:  {BHH MOOD:22306}  Affect:  {Affect (PAA):22687}  Thought Process:  Coherent  Orientation:  Full (Time, Place, and Person)  Thought Content: Logical   Suicidal Thoughts:  {ST/HT (PAA):22692}  Homicidal Thoughts:  {ST/HT (PAA):22692}  Memory:  Immediate;   Good  Judgement:  {Judgement (PAA):22694}  Insight:  {Insight (PAA):22695}  Psychomotor Activity:  Normal  Concentration:  Concentration: Good and Attention Span: Good  Recall:  Good  Fund of Knowledge: Good  Language: Good  Akathisia:  No  Handed:  Right  AIMS (if indicated): not done  Assets:  Communication Skills Desire for Improvement  ADL's:  Intact  Cognition: WNL  Sleep:  {BHH GOOD/FAIR/POOR:22877}   Screenings: AIMS     ED to Hosp-Admission (Discharged) from 10/30/2017 in [facility]  AIMS Total Score   0    AUDIT     ED to Hosp-Admission (Discharged) from 10/30/2017 in [facility]  Alcohol Use Disorder Identification Test Final Score (AUDIT)  0    GAD-7     Office Visit from 01/31/2015 in [facility]  Total GAD-7 Score  15    Mini-Mental     Office Visit from 02/20/2014 in [facility]  Total Score (max 30 points )  30    PHQ2-9     Patient Outreach from 08/31/2017 in [facility] Office Visit from 04/17/2017 in [facility] Office Visit from 04/13/2017 in [facility] Office Visit from 02/03/2017 in [facility] Office Visit from 01/30/2017 in [facility]  PHQ-2 Total Score  2  1  2  2   0  PHQ-9 Total Score  14  -  17  13  -       Assessment and Plan:  [name] is a 53 y.o. year old female with a history of depression,pseudoseizure,partialsymptomatic epilepsy with complex partial seizures, osteoarthritis, congenital hip dislocation s/p replacement , who presents for follow up appointment for No diagnosis found.  # MDD, mild, recurrent without psychotic features  # MDD, mild, recurrent without psychotic features There has been overall improvement in depressive symptoms and anxiety except in July, which was anniversary of her husband being in buried.  Will continue fluoxetine at the current dose to target depression and anxiety.  Will continue Lorazepam as needed for anxiety.  Discussed risk of dependence and oversedation.  She will greatly benefit from grief therapy and CBT; she is encouraged to continue see Mr. Sheets.  Plan 1. Continue fluoxetine 80 mg daily  2. Continue lorazepam 1 mg daily as needed for anxiety 3. Continue hydroxyzine 25 mg twice a day as needed for anxiety (  she rarely takes this medication) 4. Next appointment: 10/19 at 10:40 for 20 mins, phone -on gabapentin, Lamictal, Keppra for  seizure. - on pramipexole for restless leg  Past trials of medication: fluoxetine,sertraline (increased appetite),citalopram,mirtazapine (patient self discontinued as she was afraid of )Trazodone, lorazepam, clonazepam   The patient demonstrates the following risk factors for suicide: Chronic risk factors for suicide include: psychiatric disorder of depressionand previous suicide attempts of overdosing medication. Acute risk factorsfor suicide include: unemployment and her husband with terminal illness. Protective factorsfor this patient include: positive social support, coping skills and hope for the future. Considering these factors, the overall suicide risk at this point appears   [place] Clay, MD 10/25/2018, 12:51 PM

## 2018-11-01 ENCOUNTER — Telehealth (HOSPITAL_COMMUNITY): Payer: Self-pay | Admitting: Psychiatry

## 2018-11-01 ENCOUNTER — Ambulatory Visit (HOSPITAL_COMMUNITY): Payer: PPO | Admitting: Psychiatry

## 2018-11-01 ENCOUNTER — Other Ambulatory Visit: Payer: Self-pay

## 2018-11-01 NOTE — Telephone Encounter (Signed)
Called the patient twice for appointment scheduled today. The patient did not answer the phone. No option to leave a voice message.  

## 2018-11-05 ENCOUNTER — Encounter: Payer: Self-pay | Admitting: Family Medicine

## 2018-11-05 ENCOUNTER — Ambulatory Visit (INDEPENDENT_AMBULATORY_CARE_PROVIDER_SITE_OTHER): Payer: PPO | Admitting: Family Medicine

## 2018-11-05 ENCOUNTER — Ambulatory Visit: Payer: Self-pay | Admitting: *Deleted

## 2018-11-05 ENCOUNTER — Other Ambulatory Visit: Payer: Self-pay

## 2018-11-05 VITALS — BP 112/74 | HR 81 | Temp 98.3°F | Resp 16 | Ht 60.0 in | Wt 160.4 lb

## 2018-11-05 DIAGNOSIS — R42 Dizziness and giddiness: Secondary | ICD-10-CM

## 2018-11-05 DIAGNOSIS — Z23 Encounter for immunization: Secondary | ICD-10-CM

## 2018-11-05 DIAGNOSIS — H66001 Acute suppurative otitis media without spontaneous rupture of ear drum, right ear: Secondary | ICD-10-CM

## 2018-11-05 MED ORDER — CEPHALEXIN 500 MG PO CAPS: 500.0000 mg | ORAL_CAPSULE | Freq: Two times a day (BID) | ORAL | 0 refills | Status: DC

## 2018-11-05 MED ORDER — MECLIZINE HCL 25 MG PO TABS: 25.0000 mg | ORAL_TABLET | Freq: Three times a day (TID) | ORAL | 0 refills | Status: DC | PRN

## 2018-11-05 NOTE — Telephone Encounter (Signed)
See note

## 2018-11-05 NOTE — Telephone Encounter (Signed)
FYI, pt has been scheduled

## 2018-11-05 NOTE — Progress Notes (Signed)
Subjective  CC:  Chief Complaint  Patient presents with  . Ear Pain    Started yesterday.. R ear pain and dizziness.. She reports that at times she is having double vision and is off balance    HPI: [name] is a 53 y.o. female who presents to the office today to address the problems listed above in the chief complaint.  C/o left ear pain on and off x 2 weeks w/o fever or URI sxs. Also with new dizzines x 1-2 days. Some ear pressure. No hearing loss or head trauma. Seeing neuro: h/o pseudoseizures. On meds. No lightheadedness, paresis, ataxia, dysarthria but describes blurred vision when she is dizzy. No cough or SOB.    Assessment  1. Non-recurrent acute suppurative otitis media of right ear without spontaneous rupture of tympanic membrane   2. Vertigo   3. Need for immunization against influenza      Plan   AOM:  Treat for infection with abx and monitor.   Vertigo: related to ear infection: meclizine caution to avoid falls and monitor. Return if doesn't improve.   Follow up: prn  Visit date not found  Orders Placed This Encounter  Procedures  . Flu Vaccine QUAD 36+ mos IM   Meds ordered this encounter  Medications  . cephALEXin (KEFLEX) 500 MG capsule    Sig: Take 1 capsule (500 mg total) by mouth 2 (two) times daily.    Dispense:  14 capsule    Refill:  0  . meclizine (ANTIVERT) 25 MG tablet    Sig: Take 1 tablet (25 mg total) by mouth 3 (three) times daily as needed for dizziness.    Dispense:  30 tablet    Refill:  0      I reviewed the patients updated PMH, FH, and SocHx.    Patient Active Problem List   Diagnosis Date Noted  . MDD (major depressive disorder), recurrent episode, mild ([place]) 01/08/2018    Priority: High  . Sleep apnea 06/09/2017    Priority: High  . Mild neurocognitive disorder 02/04/2017    Priority: High  . Primary insomnia 05/19/2016    Priority: High  . Partial symptomatic epilepsy with complex partial seizures, not  intractable, with status epilepticus ([place]) 07/24/2015    Priority: High  . 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) 08/17/2013    Priority: High  . Colon polyp 06/05/2017    Priority: Medium  . Elevated liver function tests 06/05/2017    Priority: Medium  . Fatty liver     Priority: Medium  . OA (osteoarthritis) of knee 05/07/2015    Priority: Medium  . Widowed - July 2019 08/07/2017  . Bereavement 12/25/2015   Current Meds  Medication Sig  . diclofenac (VOLTAREN) 75 MG EC tablet Take 1 tablet (75 mg total) by mouth 2 (two) times daily.  Marland Kitchen FLUoxetine (PROZAC) 40 MG capsule Take 2 capsules (80 mg total) by mouth daily.  Marland Kitchen gabapentin (NEURONTIN) 600 MG tablet Take 1 tablet (600 mg total) by mouth 3 (three) times daily.  Marland Kitchen lamoTRIgine (LAMICTAL) 150 MG tablet Take 1 tablet (150 mg total) by mouth 2 (two) times daily. For mood control  . levETIRAcetam (KEPPRA) 1000 MG tablet Take 1 in the morning and 1.5 tablet at bedtime.  Marland Kitchen LORazepam (ATIVAN) 1 MG tablet Take 1 tablet (1 mg total) by mouth daily as needed for anxiety.  . pramipexole (MIRAPEX) 0.5 MG tablet Take 1 tablet by mouth twice daily  . tiZANidine (ZANAFLEX) 4 MG tablet  Take 1 tablet (4 mg total) by mouth every 6 (six) hours as needed for muscle spasms.    Allergies: Patient is allergic to acetaminophen; benadryl [diphenhydramine hcl]; codeine; dilantin [phenytoin sodium extended]; melatonin; tramadol; ultram [tramadol hcl]; vimpat [lacosamide]; mirtazapine; betadine [povidone iodine]; latex; penicillins; sulfa antibiotics; tape; and vicodin [hydrocodone-acetaminophen]. Family History: Patient family history includes Bipolar disorder in her son; Cancer in her brother, maternal aunt, and mother; Depression in her father; Diabetes in her brother and maternal aunt; Drug abuse in her son; Heart attack (age of onset: 88) in her sister. Social History:  Patient  reports that she has never smoked. She has never used smokeless tobacco. She reports that she  does not drink alcohol or use drugs.  Review of Systems: Constitutional: Negative for fever malaise or anorexia Cardiovascular: negative for chest pain Respiratory: negative for SOB or persistent cough Gastrointestinal: negative for abdominal pain  Objective  Vitals: BP 112/74   Pulse 81   Temp 98.3 F (36.8 C) (Tympanic)   Resp 16   Ht 5' (1.524 m)   Wt 160 lb 6.4 oz (72.8 kg)   SpO2 95%   BMI 31.33 kg/m  General: no acute distress , A&Ox3 HEENT: PEERL, conjunctiva normal, Oropharynx moist,neck is supple, right TM with suppurative effusion, left nl.  Cardiovascular:  RRR without murmur or gallop.  Respiratory:  Good breath sounds bilaterally, CTAB with normal respiratory effort Skin:  Warm, no rashes Neuro: shakes intermittently. No nystagmus. Nl cranial nerves and gait     Commons side effects, risks, benefits, and alternatives for medications and treatment plan prescribed today were discussed, and the patient expressed understanding of the given instructions. Patient is instructed to call or message via MyChart if he/she has any questions or concerns regarding our treatment plan. No barriers to understanding were identified. We discussed Red Flag symptoms and signs in detail. Patient expressed understanding regarding what to do in case of urgent or emergency type symptoms.   Medication list was reconciled, printed and provided to the patient in AVS. Patient instructions and summary information was reviewed with the patient as documented in the AVS. This note was prepared with assistance of Dragon voice recognition software. Occasional wrong-word or sound-a-like substitutions may have occurred due to the inherent limitations of voice recognition software

## 2018-11-05 NOTE — Patient Instructions (Addendum)
Please return in 3 months for for your annual complete physical; please come fasting.  Please complete the antibiotics and let me know if things do not improve.  Be very careful to avoid falling while you are feeling dizzy.  Use the meclizine as needed to help improve the dizziness.   If you have any questions or concerns, please don't hesitate to send me a message via MyChart or call the office at [phone]. Thank you for visiting with [name] today! It's our pleasure caring for you.

## 2018-11-05 NOTE — Telephone Encounter (Signed)
Patient is calling to report she had dizziness with R ear pain. Patient states she feels off balance and is having a hard time with standing and walking.  Reason for Disposition . [1] Dizziness caused by heat exposure, sudden standing, or poor fluid intake AND [2] no improvement after 2 hours of rest and fluids  Answer Assessment - Initial Assessment Questions 1. DESCRIPTION: "Describe your dizziness."     Patient is so dizzy she can't function- she does have ear pain 2. LIGHTHEADED: "Do you feel lightheaded?" (e.g., somewhat faint, woozy, weak upon standing)     Off- balance 3. VERTIGO: "Do you feel like either you or the room is spinning or tilting?" (i.e. vertigo)     no 4. SEVERITY: "How bad is it?"  "Do you feel like you are going to faint?" "Can you stand and walk?"   - MILD - walking normally   - MODERATE - interferes with normal activities (e.g., work, school)    - SEVERE - unable to stand, requires support to walk, feels like passing out now.      Moderate/severe 5. ONSET:  "When did the dizziness begin?"     yesterday 6. AGGRAVATING FACTORS: "Does anything make it worse?" (e.g., standing, change in head position)     standing 7. HEART RATE: "Can you tell me your heart rate?" "How many beats in 15 seconds?"  (Note: not all patients can do this)       Feels like it is beating faster than normal when occurring 8. CAUSE: "What do you think is causing the dizziness?"     Inner ear problem- patient has ear pain in the R ear 9. RECURRENT SYMPTOM: "Have you had dizziness before?" If so, ask: "When was the last time?" "What happened that time?"     Patient states she has had this occur before- patient was treated with medication 10. OTHER SYMPTOMS: "Do you have any other symptoms?" (e.g., fever, chest pain, vomiting, diarrhea, bleeding)       no 11. PREGNANCY: "Is there any chance you are pregnant?" "When was your last menstrual period?"       n/a  Protocols used: DIZZINESS [facility]

## 2018-11-07 ENCOUNTER — Encounter: Payer: Self-pay | Admitting: Family Medicine

## 2018-11-10 ENCOUNTER — Telehealth (HOSPITAL_COMMUNITY): Payer: Self-pay | Admitting: *Deleted

## 2018-11-10 ENCOUNTER — Ambulatory Visit: Payer: Self-pay

## 2018-11-10 NOTE — Telephone Encounter (Signed)
See note

## 2018-11-10 NOTE — Telephone Encounter (Signed)
No other RX medication recommended. Can try otc dramamine if she knows she can tolerate it. Is ear feeling better?

## 2018-11-10 NOTE — Telephone Encounter (Signed)
See below & advise

## 2018-11-10 NOTE — Telephone Encounter (Signed)
Pt aware, verbalized understanding. Will try dramamine and states ear is improving. She is aware to callback if things get worse again.

## 2018-11-10 NOTE — Telephone Encounter (Signed)
Pt. Reports she was put on Meclizine for dizziness and "the medicine made me hallucinate and see people that weren't there." Stopped the Meclizine and it went away. Now the dizziness is back and would like to know if there is something else she can take for it. Please advise pt.  Answer Assessment - Initial Assessment Questions 1.   NAME of MEDICATION: "What medicine are you calling about?"     Meclizine 2.   QUESTION: "What is your question?"     Pt. Had hallucinations after starting the medication 3.   PRESCRIBING HCP: "Who prescribed it?" Reason: if prescribed by specialist, call should be referred to that group.     Dr. Jerline Pain 4. SYMPTOMS: "Do you have any symptoms?"     Had hallucinations 5. SEVERITY: If symptoms are present, ask "Are they mild, moderate or severe?"     Moderate 6.  PREGNANCY:  "Is there any chance that you are pregnant?" "When was your last menstrual period?"     No  Protocols used: MEDICATION QUESTION CALL-A-AH

## 2018-11-10 NOTE — Telephone Encounter (Signed)
Patient called requesting refill  FLUoxetine (PROZAC) 40 MG capsule. 0 refills available @ [facility]

## 2018-11-11 ENCOUNTER — Other Ambulatory Visit (HOSPITAL_COMMUNITY): Payer: Self-pay | Admitting: Psychiatry

## 2018-11-11 DIAGNOSIS — F330 Major depressive disorder, recurrent, mild: Secondary | ICD-10-CM

## 2018-11-11 MED ORDER — FLUOXETINE HCL 40 MG PO CAPS: 80.0000 mg | ORAL_CAPSULE | Freq: Every day | ORAL | 0 refills | Status: DC

## 2018-11-11 NOTE — Telephone Encounter (Signed)
Ordered. Please discuss attendance policy and make sure she has follow up (she can be scheduled in Dec).

## 2018-11-15 ENCOUNTER — Telehealth (HOSPITAL_COMMUNITY): Payer: Self-pay

## 2018-11-15 NOTE — Telephone Encounter (Signed)
Medication management - Attempted to call pt to follow up on message she requesting a refill of her prescribed Ativan. No answer so called her [facility] and spoke with Gabriel Cirri, pharmacist to see if they had orders from 08/18/18?  Collateral verified they had the order from 08/18/18 + 2 refills, that patient had filled this on 08/18/18 and 09/21/18 and still had a refill to pick up.  Agreed to attempt to inform patient the order was at her pharmacy and being filled. Called patient back and got her on the phone this time to tell her she still had a refill at her [facility] of her requested Ativan and they were filling this now for her to pick up as it is not early. Patient to call back if any problems.

## 2018-11-30 ENCOUNTER — Ambulatory Visit (INDEPENDENT_AMBULATORY_CARE_PROVIDER_SITE_OTHER): Payer: PPO | Admitting: Family Medicine

## 2018-11-30 ENCOUNTER — Encounter: Payer: Self-pay | Admitting: Family Medicine

## 2018-11-30 DIAGNOSIS — J069 Acute upper respiratory infection, unspecified: Secondary | ICD-10-CM | POA: Diagnosis not present

## 2018-11-30 NOTE — Progress Notes (Signed)
TELEPHONE ENCOUNTER   Patient verbally agreed to telephone visit and is aware that copayment and coinsurance may apply. Patient was treated using telemedicine according to accepted telemedicine protocols.  Location of the patient: home Location of provider: [facility], [place] of all persons participating in the telemedicine service and role in the encounter: [name], MD, Amber Agner, CMA   Subjective  CC:  Chief Complaint  Patient presents with  . Cough    x2 days, no fever, not taking anything  .     HPI: [name] is a 53 y.o. female who was telephoned today to address the problems listed above in the chief complaint.  53 yo with nasal congestion and cough, dry and hacking w/o fevers, chills, sob, doe, chest pain, myalgias, loss of taste or smell, or GI sxs. Feels like it is a "head cold". No sinus pain. No ear pain. Denies possibility of covid exposure: has been home.    ASSESSMENT: 1. URI with cough and congestion      URI:  Most consistent with viral illness. Discussed possibility of covid but pt defers testing at this time. I recommend self home isolation, supportive care with cough meds and decongestants. IF worsens: fever, body aches, sob, then she will let me know so we may get her tested for covid.  Time spent with the patient (non face-to-face time during this virtual encounter): 12 minutes, spent in obtaining information about her symptoms, reviewing her previous labs, evaluations, and treatments, counseling her about her condition (please see the discussed topics above), and developing a plan to further investigate it; the patient was provided an opportunity to ask questions and all were answered. The patient agreed with the plan and demonstrated an understanding of the instructions.   The patient was advised to call back or seek an in-person evaluation if the symptoms worsen or if the condition fails to improve as anticipated.   Follow up: cpe at pt's convenience. Last sept 2019. Pt to call and schedule.  Visit date not found  No orders of the defined types were placed in this encounter.  No orders of the defined types were placed in this encounter.    I reviewed the patients updated PMH, FH, and SocHx.    Patient Active Problem List   Diagnosis Date Noted  . MDD (major depressive disorder), recurrent episode, mild ([place]) 01/08/2018    Priority: High  . Sleep apnea 06/09/2017    Priority: High  . Mild neurocognitive disorder 02/04/2017    Priority: High  . Primary insomnia 05/19/2016    Priority: High  . Partial symptomatic epilepsy with complex partial seizures, not intractable, with status epilepticus ([place]) 07/24/2015    Priority: High  . 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 Colon polyp 06/05/2017    Priority: Medium  . Elevated liver function tests 06/05/2017    Priority: Medium  . Fatty liver     Priority: Medium  . OA (osteoarthritis) of knee 05/07/2015    Priority: Medium  . Widowed - July 2019 08/07/2017  . Bereavement 12/25/2015   Current Meds  Medication Sig  . BUSPIRONE HCL PO Take by mouth.  . cephALEXin (KEFLEX) 500 MG capsule Take 1 capsule (500 mg total) by mouth 2 (two) times daily.  . diclofenac (VOLTAREN) 75 MG EC tablet Take 1 tablet (75 mg total) by mouth 2 (two) times daily.  Marland Kitchen FLUoxetine (PROZAC) 40 MG capsule Take 2 capsules (  80 mg total) by mouth daily.  Marland Kitchen gabapentin (NEURONTIN) 600 MG tablet Take 1 tablet (600 mg total) by mouth 3 (three) times daily.  Marland Kitchen lamoTRIgine (LAMICTAL) 150 MG tablet Take 1 tablet (150 mg total) by mouth 2 (two) times daily. For mood control  . levETIRAcetam (KEPPRA) 1000 MG tablet Take 1 in the morning and 1.5 tablet at bedtime.  Marland Kitchen LORazepam (ATIVAN) 1 MG tablet Take 1 tablet (1 mg total) by mouth daily as needed for anxiety.  . meclizine (ANTIVERT) 25 MG tablet Take 1 tablet (25 mg total) by mouth 3 (three) times daily as needed  for dizziness.  . pramipexole (MIRAPEX) 0.5 MG tablet Take 1 tablet by mouth twice daily  . tiZANidine (ZANAFLEX) 4 MG tablet Take 1 tablet (4 mg total) by mouth every 6 (six) hours as needed for muscle spasms.    Allergies: Patient is allergic to acetaminophen; benadryl [diphenhydramine hcl]; codeine; dilantin [phenytoin sodium extended]; melatonin; tramadol; ultram [tramadol hcl]; vimpat [lacosamide]; mirtazapine; betadine [povidone iodine]; latex; penicillins; sulfa antibiotics; tape; and vicodin [hydrocodone-acetaminophen]. Family History: Patient family history includes Bipolar disorder in her son; Cancer in her brother, maternal aunt, and mother; Depression in her father; Diabetes in her brother and maternal aunt; Drug abuse in her son; Heart attack (age of onset: 14) in her sister. Social History:  Patient  reports that she has never smoked. She has never used smokeless tobacco. She reports that she does not drink alcohol or use drugs.  Review of Systems: Constitutional: Negative for fever malaise or anorexia Cardiovascular: negative for chest pain Respiratory: negative for SOB or persistent cough Gastrointestinal: negative for abdominal pain

## 2018-12-03 ENCOUNTER — Ambulatory Visit (HOSPITAL_COMMUNITY): Payer: PPO | Admitting: Psychiatry

## 2018-12-15 ENCOUNTER — Ambulatory Visit (INDEPENDENT_AMBULATORY_CARE_PROVIDER_SITE_OTHER): Payer: PPO | Admitting: Neurology

## 2018-12-15 ENCOUNTER — Other Ambulatory Visit: Payer: Self-pay | Admitting: Family Medicine

## 2018-12-15 ENCOUNTER — Encounter: Payer: Self-pay | Admitting: Neurology

## 2018-12-15 ENCOUNTER — Other Ambulatory Visit: Payer: Self-pay

## 2018-12-15 DIAGNOSIS — I469 Cardiac arrest, cause unspecified: Secondary | ICD-10-CM | POA: Diagnosis not present

## 2018-12-15 DIAGNOSIS — F445 Conversion disorder with seizures or convulsions: Secondary | ICD-10-CM | POA: Diagnosis not present

## 2018-12-15 DIAGNOSIS — Z1231 Encounter for screening mammogram for malignant neoplasm of breast: Secondary | ICD-10-CM

## 2018-12-15 MED ORDER — LEVETIRACETAM 1000 MG PO TABS: ORAL_TABLET | ORAL | 11 refills | Status: DC

## 2018-12-15 MED ORDER — LAMOTRIGINE 150 MG PO TABS: 150.0000 mg | ORAL_TABLET | Freq: Two times a day (BID) | ORAL | 11 refills | Status: DC

## 2018-12-15 NOTE — Progress Notes (Addendum)
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 She had 2 "seizures" on Thanksgiving.  She reports getting dizzy, and had to be brought home by my sister in her car- " I couldn't walk - so dizzy I was " - I didn't lose awareness" - "I knew where I was and who was with me- consciosness. Mother was with her but she is unsure how long episodes lasted: " prpbably around 3 minutes".  She fell with one of the two events.  She is anxious today. BP elevated, she is alone, no witness is with her. She feels very dizzy, anxious and nervous- tense.   She needs a refill for Keppra. She is also taking lamotrigine 150mg  twice daily for mood stabilization. She is followed by PCP and psychiatry regularly.    Last seen by Debbora Presto, NP. 08/24/18 [name] is a 53 y.o. female here today for follow up of pseudoseizure. Overall she is doing fairly well. She continues to grieve the loss of her husband. She reports that her last seizure was on July 4th.  She was in [employer] and became agitated and scared when she got lost in the store. She denies LOC. She reports having a "spell." her son was with her and took her home. No medical attention sought. She continues levetiracetam Today 08/24/18 [name] is a 53 y.o. female here today for follow up of pseudoseizure. Overall she is doing fairly well. She continues to grieve the loss of her husband. She reports that her last seizure was on July 4th. She was in [employer] and became agitated and scared when she got lost in the store. She denies LOC. She reports having a "spell." her son was with her and took her home. No medical attention sought. She continues levetiracetam 1000mg  in the morning and 1500mg  in the evening. She is also taking lamotrigine 150mg  twice daily for mood stabilization. She is followed by  PCP and psychiatry regularly.     Rv 02-22-2018, patient with non- epileptic seizures ( see the unusual EMU report from 04-2017 [facility], after her medications were held, she had a "spell " and presented to the ED). I have the pleasure of seeing her today again after she had supposing the seizures.  Nurse practitioner [facility] send me a note on 07 December 2017.  She stated that she had seen the patient the week prior to Thanksgiving and she was not quite herself at the time she would be confused, hesitating and answering questions over just repeatedly say I do not know.  Her daughter-in-law was present and thought she may have had a seizure in her sleep however after the nurse practitioner left [name] reportedly had 3 spells.  She had been given Flexeril for flank pain and had taken 1 in the 24 hours before this occurred, however I think this is unlikely to be the cause.  I would like to add that the patient lost her husband July 2019 and is still grieving, there have been other psychosocial triggers and stressors.  She was having severe flank pain, finally had a fall in December 2019 when she tripped over her vacuum cleaner , hit her head, and went to the ED. She was found to have kidney stones, and a mass on the left ovary-  she had not followed up- has only now made her GYN appointment.    She remains on Keppra at the time of the spells.  There is no way that Keppra could be used as a quick acting intervention but for this we can use a benzodiazepine that should dissolve under the tongue or in the buccal mucosa. Today she reports not having had "seizures " in a while- 3 weeks.      08-19-2017, RV . [name] is a 53 year old Caucasian female patient, recently widowed ( 08-11-2017) after 63 years of marriage. She is grieving, her husband had CHF and PE.  With the passing of her husband her spells have increased, her stress level has increased.  In her last visit with Vaughan Browner here  in the office be referred to [facility] for an EMU admission. She had one day in EMU - May 1st 2019. Her daughter reports that after the EEG was removed she then had the 'big seizures " and needed to go to ED at [facility]. She had no family member with her - and that may have been an added difficulty.  Referral to psychology/ psychiatry . I explained that I need not to follow up with her, as I have nothing else to offer and her insomnia is secondary to pain.    Report from [place] - Dr. Coralyn Mark. 05-12-2017,  53 y.o. female w/ above documented PMHx/PSHx who presents to the ED for seizure- like- activity. MD that presented with pt was concerned about grand mal activity however pt had no tongue biting, no incontinence and no postictal confusion. She was tired but may also now be 2/2 ativan administration. In any case, pt has a hx on recent EMU admission of having had non- epileptic spells and feel that in the setting of her rapid improvement after mx grand mal seizure that this is more likely to be a non epileptic spell than seizures concerning for status since-  she otherwise reports feeling at her baseline (minus the fatigue). She reports compliance with her home AED regimen.  In the absence of fevers, HA, neck pain or any other unexpected finding after a seizure, since she has a hx of seizures, will defer further evaluation at this time--including no bloodwork or CT imaging.  Pt was observed and after resting for a while reported feeling at her baseline.  She reports that the recent stress of her husbands illness is the likely trigger for her spells.  She was able to ambulate and tolerate PO without difficulty. They are amendable to d/c and have no other complaints or concerns. Usual and customary return precautions for seizure like activity was discussed with PT. All questions answered. PT stated understanding and agreement with plan. Encouraged PCP follow up as needed and adherence to seizure  precautions. Pt and VS stable at d/c.  Patient went to ED on 07-12-2017 once more , was briefly observed and released.    MM- The patient continues to have seizure events.  It is unclear whether these are always epileptic events or pseudoseizures.  I will try increasing Keppra to thousand milligrams in the morning and 1500 mg in the evening.  If this causes excessive drowsiness she will let us know.  She will remain on Lamictal 150 mg twice a day.  I will check blood work today.  The patient will be referred to [facility] for possible EMU admission.  The patient's last EMU admit was in 2004 and it demonstrated both  epileptic events as well as pseudoseizures.  I will send the patient for reevaluation.  Patient is amenable to this plan.  She will call if her symptoms worsen.   [id number] -28-2018, [name] has recently been prescribed trazodone to help her sleep per her psychiatrist. She has seen Dr. Jannifer Franklin for intractable seizures and a visit about 2 months ago. She continues to take Lamictal, she continues to take Celexa. 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 She reports it is a pain that keeps her from sleeping now. Dr. Maureen Ralphs treats her for her orthopedic symptoms, she also reports that she tried melatonin which has failed, has tried Ambien which causes parasomnia. Her psychiatrist is weaning her off Klonopin. Her psychiatrist in [place]  is weaning her off- she has been taking it for 7 years, started under Dr Erling Cruz.  I explained that I need not to follow up with her, as I have nothing else to offer and her insomnia is secondary to pain.   HISTORY OF PRESENT ILLNESS: [name] is a 53 year old female with a history of seizures, for years was Dr. Bernardo Heater patient.  She reports that she had a seizure last week, Saturday.  She states that she's been taking Keppra and Lamictal consistently excpet for Friday night- the missed dose may have provoked the seizure. .  Denies missing any medication. She states that after hitting her head she suffered a headache for 2 days however that has resolved. The patient does not operate a motor vehicle. At home she is able to complete all ADLs independent. She is no longer on narcotic medication. She reports that she will be having a second  knee replacement in 4 month. Mr. Bonham has done well with her last full knee replacement in April of this year, she had a hip replacement already 17 years ago, both by Dr.alusio. Mrs. Inman reports that she has insomnia problems. There is also a report that her husband is very hard of hearing to the TV is very loud during the day and communication at times difficult. She has regular bedtimes between 9 and 10 PM, she will fall asleep at she will wake up around 2 AM and has great difficulties to resume sleep. She could use melatonin 3 mg at night as a natural supplement to help her stay asleep longer, if this should be to no avail I would consider a low-dose of Klonopin. In a seizure patient I would not like to use Ambien as most benzodiazepines have at least an additional seizure controlling affect. She also has a history of parasomnia / sleepwalking. She usually sleeps on her side, she sleeps on one pillow. The bedroom is cool, quiet and dark. She has a TV in the bedroom but cuts it off when she goes to bed. She is not sure what wakes her at 2 in the morning but it seems to be in early morning ritual and is very difficult for her to reinitiate sleep. Since her husband has noted her to snore but we have no reliable source reporting apnea, I will order an attended sleep study to test her.   HISTORY 02/01/2015 (MM): [name] is a 80 female with a history of seizures. She returns today for an evaluation. The patient had several seizures and was eventually taken to the emergency room. The patient did improve once at the emergency room. She did not have to be intubated. Her Keppra was increased to  1000 mg twice a day. The patient continues on Lamictal. Since then the patient has not had any additional seizures. The patient's sister-in-law brought to my attention in private that she feels that the patient is taking pain medicine inappropriately. She is wondering if this was the cause of some of her symptoms/seizures when she went to the emergency room. She states that her physicians continue to give her pain medicine for her knee pain even after they have called and requested that she not be given this. Patient reports that she is able to complete ADLs independently. Patient does report ongoing depression. In the past she has been asked to follow-up with mental health however the patient has failed to do this. She is currently on Celexa.She denies any new neurological symptoms. She returns today for an evaluation.  HISTORY 08/21/14 [facility]):  [name] is here today for follow-up on her subjective memory complaints. A [place] cognitive assessment test was performed today in which she failed on the Trail Making Test and was unable to copy a cube. The contour of her clock was also father droopy but she placed the numbers and hands correctly. She was able to name objects she did very well in all parts of attention testing including serial sevens and repeated the complicated sentences her word fluency however was limited she only generated 8 words she was good as obstruction and fully oriented to place time and date her delayed recall testing showed 4 out of 5 delayed words to be correctly remembered this is a very good result she scored 26 out of 30 points. Patient is high school educated and this would be an normal score at 26-30.  We discussed anecdotal evidence for her spells- her husband reported that a couple of times they have been to the grocery store and she needed to sit down for moment she seems to be mostly claustrophobic when surrounded by a lot of people. This may be more of an anxiety  attack. Reports some insomnia, mostly related to being worried about her meanwhile 4 year old son. When she hasn't had a good night sleep she is more likely to be hypersensitive to claustrophobia or to other spells of anxiety or panic. No other "seizures " on the current medication.  REVIEW OF SYSTEMS: Out of a complete 14 system review of symptoms, the patient complains only of the following symptoms, and all other reviewed systems are negative.  Constipation, leg swelling, restless leg, joint pain, aching muscles, walking difficulty, headache, seizures, agitation, depression  ALLERGIES: Allergies  Allergen Reactions   Acetaminophen Other (See Comments)    Has fatty deposits on liver   Benadryl [Diphenhydramine Hcl] Other (See Comments)    hyperactivity and seizures   Codeine Itching and Rash    seizures   Dilantin [Phenytoin Sodium Extended] Other (See Comments)    Elevated LFT's   Melatonin Other (See Comments)    seizures   Tramadol Other (See Comments)    "causes seizures"   Ultram [Tramadol Hcl] Other (See Comments)    Seizures   Vimpat [Lacosamide] Other (See Comments)    Severe dizziness   Mirtazapine Other (See Comments)    Potentiated seizure activity   Betadine [Povidone Iodine] Rash   Latex Rash   Penicillins Itching and Rash    Has patient had a PCN reaction causing immediate rash, facial/tongue/throat swelling, SOB or lightheadedness with hypotension: No Has patient had a PCN reaction causing severe rash involving mucus membranes or skin necrosis: No Has patient had  a PCN reaction that required hospitalization No Has patient had a PCN reaction occurring within the last 10 years: No If all of the above answers are "NO", then may proceed with Cephalosporin use.   Sulfa Antibiotics Rash   Tape Rash and Other (See Comments)    Paper tape please Paper tape please   Vicodin [Hydrocodone-Acetaminophen] Itching    HOME MEDICATIONS: Outpatient  Medications Prior to Visit  Medication Sig Dispense Refill   BUSPIRONE HCL PO Take by mouth.     cephALEXin (KEFLEX) 500 MG capsule Take 1 capsule (500 mg total) by mouth 2 (two) times daily. 14 capsule 0   diclofenac (VOLTAREN) 75 MG EC tablet Take 1 tablet (75 mg total) by mouth 2 (two) times daily. 30 tablet 0   FLUoxetine (PROZAC) 40 MG capsule Take 2 capsules (80 mg total) by mouth daily. 180 capsule 0   gabapentin (NEURONTIN) 600 MG tablet Take 1 tablet (600 mg total) by mouth 3 (three) times daily. 270 tablet 3   lamoTRIgine (LAMICTAL) 150 MG tablet Take 1 tablet (150 mg total) by mouth 2 (two) times daily. For mood control 60 tablet 11   levETIRAcetam (KEPPRA) 1000 MG tablet Take 1 in the morning and 1.5 tablet at bedtime. 75 tablet 11   LORazepam (ATIVAN) 1 MG tablet Take 1 tablet (1 mg total) by mouth daily as needed for anxiety. 30 tablet 2   meclizine (ANTIVERT) 25 MG tablet Take 1 tablet (25 mg total) by mouth 3 (three) times daily as needed for dizziness. 30 tablet 0   pramipexole (MIRAPEX) 0.5 MG tablet Take 1 tablet by mouth twice daily 60 tablet 5   tiZANidine (ZANAFLEX) 4 MG tablet Take 1 tablet (4 mg total) by mouth every 6 (six) hours as needed for muscle spasms. 30 tablet 0   No facility-administered medications prior to visit.     PAST MEDICAL HISTORY: Past Medical History:  Diagnosis Date   Arthritis    "knees" (04/22/2012)   Chronic bronchitis ([place])    "yearly; when the weather changes" (04/22/2012)   Colon polyp    Colon polyps    adenomatous and hyperplastic-   Depression    Eczema    Epilepsy ([place])    "been having them right often here lately" (04/22/2012)   Fatty liver    High cholesterol    History of kidney stones    Osteoarthritis    /notes 04/22/2012   Other convulsions 05/21/12   non-epileptic spells   Restless leg    Seizures ([place])    Vertigo     PAST SURGICAL HISTORY: Past Surgical History:  Procedure Laterality Date     ABDOMINAL HYSTERECTOMY  2001   BLADDER SUSPENSION     BUNIONECTOMY Left 2000   CESAREAN SECTION  1987; 1988   COLONOSCOPY     JOINT REPLACEMENT     MASS EXCISION  10/22/2011   Procedure: EXCISION MASS;  Surgeon: [name], MD;  Location: [facility];  Service: General;  Laterality: Right;  excision right buttock mass   TOTAL HIP ARTHROPLASTY Left 1993; 1995; 2000   TOTAL KNEE ARTHROPLASTY Left 05/07/2015   Procedure: TOTAL LEFT KNEE ARTHROPLASTY;  Surgeon: [name], MD;  Location: [facility];  Service: Orthopedics;  Laterality: Left;   TOTAL KNEE ARTHROPLASTY Right 10/01/2015   Procedure: RIGHT TOTAL KNEE ARTHROPLASTY;  Surgeon: [name], MD;  Location: [facility];  Service: Orthopedics;  Laterality: Right;    FAMILY HISTORY: Family History  Problem Relation Age  of Onset   Cancer Mother    Depression Father    Cancer Brother    Diabetes Brother    Cancer Maternal Aunt    Diabetes Maternal Aunt    Bipolar disorder Son    Drug abuse Son    Heart attack Sister 23       during severe illness    SOCIAL HISTORY: Social History   Socioeconomic History   Marital status: Widowed    Spouse name: [name]   Number of children: 2   Years of education: 12 +   Highest education level: Not on file  Occupational History   Occupation: disability, medical  Social Needs   Financial resource strain: Not very hard   Food insecurity    Worry: Never true    Inability: Never true   Transportation needs    Medical: No    Non-medical: No  Tobacco Use   Smoking status: Never Smoker   Smokeless tobacco: Never Used  Substance and Sexual Activity   Alcohol use: No    Alcohol/week: 0.0 standard drinks    Comment: 12-25-2015 per pt no   Drug use: No    Comment: 21-12-2015 per pt no    Sexual activity: Not Currently    Birth control/protection: Surgical  Lifestyle   Physical activity    Days per week: 7 days    Minutes per session: 60 min   Stress:  Rather much  Relationships   Social connections    Talks on phone: More than three times a week    Gets together: More than three times a week    Attends religious service: More than 4 times per year    Active member of club or organization: No    Attends meetings of clubs or organizations: Not on file    Relationship status: Widowed   Intimate partner violence    Fear of current or ex partner: No    Emotionally abused: No    Physically abused: No    Forced sexual activity: No  Other Topics Concern   Not on file  Social History Narrative   Patient has two adult children.   Patient is disabled.   Patient has a high school education and some trade school.   Patient is right-handed.   Patient drinks four glasses of soda and tea daily.      PHYSICAL EXAM  Vitals:   12/15/18 1029  BP: (!) 131/100  Pulse: 94  Temp: (!) 97.2 F (36.2 C)  SpO2: 98%  Weight: 157 lb 8 oz (71.4 kg)  Height: 5' (1.524 m)   Body mass index is 30.76 kg/m.  Generalized: Well developed, in no acute distress - slurred speech , dysphasia- and regressive behavior.  Childlike, needy. Also tearful and certainly under  distress by her husbands death.    Neck circumference is 14.5 inches, the patient's Mallampati is grade 3- her uvula is midline but doesn't lift completely up past the tongue ground, she does have symmetric facial movements, she has a mild ptosis of the right eye, long standing.  Neurological examination   Mentation: Alert oriented to time, place, history taking.  Follows all commands language fluent. Patient's speech is slightly slurred, lisps.   She states that this is because she does not have her dentures in- family confirms that she has spoken like this for years.   CN: ptosis on the right ,full EOM- no nystagmus. Symmetric facial strength , sensation intact. Tongue and uvula midline.  She has equal grip strength, full ROM for neck and shoulder.  She has very brisk DTRs ,  hyperreflexia in lower extremities.   Gait intact.    DIAGNOSTIC DATA (LABS, IMAGING, TESTING)  - I reviewed patient records, labs, notes, testing and imaging myself where available.  EMU notes 2018.  ED notes 12/2017     ASSESSMENT AND PLAN:  53 y.o. year old female, looking older than her numeric age, here after yet another "seizure". This was another 30 minutes visit.  We should agree to the term "Other convulsions" - with 15 minutes of loss of awareness.   Reviewed EMU notes, EEG and ED notes and the telephone info through NP - all suggestive of non epileptic seizures.   She  has a past medical history of Arthritis, Chronic bronchitis ([place]), Colon polyp, Colon polyps, Depression, Eczema, Epilepsy ([place]), Fatty liver, High cholesterol, History of kidney stones, Osteoarthritis, Other convulsions (05/21/12), Restless leg, Seizures ([place]), and Vertigo. here with:    1. Grief reaction- high stress . 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 Recommend grief counseling- hospice in [place]. Their son is in prison- will remian there 2023. .  2. Non epileptic seizures-I explained that I need not to follow up with her, as I have nothing else to offer and her insomnia is secondary to pain.  She resumed all her seizure medicines after only 1 day of EMU stay, in addition she is taking sleep medicine Remeron, Mirapex for restless legs, Ativan 1.5 mg as needed for anxiety at night.    PS : Dr Erling Cruz had followed her for years, assuming she would have 2 or more seizure foci and possibly additional non-epileptic spells.  She had been referred for EMU, but resisted for a long time. The visit was finally not a success. NON epileptic seizures highly suspected_   I will continue Keppra 500 mg to 3 tablets in the morning and 3 tablets in the evening. She will continue on Lamictal 150 mg twice a day as this medication is  also mood stabilizing.  Her last spell was 12-10-2018 RLS and pain medication filled by PCP, new PCP is Billey Chang,  MD  Dr Irving Copas,  psychiatric care     [name], MD    12/15/2018, 10:55 AM [facility] [address] [place], [place] [address] [phone]

## 2018-12-15 NOTE — Patient Instructions (Signed)
Non-Epileptic Seizures, Adult A seizure can cause:  Involuntary movements, like falling or shaking.  Changes in awareness or consciousness.  Convulsions. These are episodes of uncontrollable, jerking movement caused by sudden, intense tightening (contraction) of the muscles. Epileptic seizures are caused by abnormal electrical activity in the brain. Non-epileptic seizures are different. They are not caused by abnormal electrical signals in your brain. These seizures may look like epileptic seizures, but they are not caused by epilepsy. There are two types of non-epileptic seizures:  Physiologic non-epileptic seizure. This type results from an underlying problem that causes a disruption in your brain's electrical activity.  Psychogenic non-epileptic seizure. This type results from emotional stress. These seizures are sometimes called pseudoseizures. What are the causes? Causes of physiologic non-epileptic seizures can include:  Sudden drop in blood pressure.  Low blood sugar (glucose).  Low levels of salt (sodium) in your blood.  Low levels of calcium in your blood.  Migraine.  Heart rhythm problems.  Sleep disorders, such as narcolepsy.  Movement disorders, such as Tourette syndrome.  Infection.  Certain medicines.  Drug and alcohol abuse.  Fever. Common causes of psychogenic non-epileptic seizures can include:  Stress.  Emotional trauma.  Sexual or physical abuse.  Major life events, such as divorce or death of a loved one.  Mental health disorders, including anxiety and depression. What are the signs or symptoms? Symptoms of a non-epileptic seizure can be similar to those of an epileptic seizure, which may include:  A change in attention or behavior (altered mental status).  Loss of consciousness or fainting.  Convulsions with rhythmic jerking movements.  Drooling.  Rapid eye movements.  Grunting.  Loss of bladder control and bowel control.   Bitter taste in the mouth.  Tongue biting. Some people experience unusual sensations (aura) before having a seizure. These can include:  "Butterflies" in the stomach.  Abnormal smells or tastes.  A feeling of having had a new experience before (dj vu). After a non-epileptic seizure, you may have a headache or sore muscles or feel confused and sleepy. Non-epileptic seizures usually:  Do not cause physical injuries.  Start slowly.  Include crying or shrieking.  Last longer than 2 minutes.  Include pelvic thrusting. How is this diagnosed? Non-epileptic seizures may be diagnosed by:  Your medical history.  A physical exam.  Your symptoms. ? Your health care provider may want to talk with your friends or relatives who have seen you have a seizure. ? If possible, it is helpful if you write down your seizure activity, including what led up to the seizure, and share that information with your health care provider. You may also need to have tests to look for causes of physiologic non-epileptic seizures. These may include:  An electroencephalogram (EEG). This test measures electrical activity in your brain. If you have had a non-epileptic seizure, the results of your EEG will likely be normal.  Video EEG. This test takes place in the hospital over the course of 2-7 days. The test uses a video camera and an EEG to monitor your symptoms and the electrical activity in your brain.  Blood tests.  Lumbar puncture. This test involves pulling fluid from your spine to check for infection.  Electrocardiogram (ECG or EKG). This test checks for an abnormal heart rhythm.  CT scan. If your health care provider thinks you have had a psychogenic non-epileptic seizure, you may need to see a mental health specialist for an evaluation. How is this treated? The treatment for your  seizures will depend on what is causing them. When the underlying condition is treated, your seizures should stop. If  your seizures are being caused by emotional trauma or stress, your health care provider may recommend that you see a mental health professional. Treatment may include:  Relaxation therapy or cognitive behavioral therapy.  Medicines to treat depression or anxiety.  Individual or family counseling. In some cases, you may have psychogenic seizures in addition to epileptic seizures. If this is the case, you may be prescribed medicine to help with the epileptic seizures. Follow these instructions at home: Home care will depend on the type of non-epileptic seizures that you have. In general:  Follow all instructions from your health care provider. These may include ways to prevent seizures and what to do if you have a seizure.  Take over-the-counter and prescription medicines only as told by your health care provider.  Keep all follow-up visits as told by your health care provider. This is important.  Make sure family members, friends, and coworkers are trained on how to help you if you have a seizure. If you have a seizure, they should: ? Lay you on the ground to prevent a fall. ? Place a pillow or piece of clothing under your head. ? Loosen any clothing around your neck. ? Turn you onto your side. If vomiting occurs, this helps keep your airway clear.  Avoid any substances that may prevent your medicine from working properly. If you are prescribed medicine for seizures: ? Do not use recreational drugs. ? Limit or avoid alcoholic beverages. Contact a health care provider if:  Your seizures change or become more frequent.  You continue to have seizures after treatment. Get help right away if:  You injure yourself during a seizure.  You have one seizure after another.  You have trouble recovering from a seizure.  You have chest pain or trouble breathing.  You have a seizure that lasts longer than 5 minutes. Summary  Non-epileptic seizures may look like epileptic seizures, but  they are not caused by epilepsy.  The treatment for your seizures will depend on what is causing them. When the underlying condition is treated, your seizures should stop.  Make sure family members, friends, and coworkers are trained on how to help you if you have a seizure. If you have a seizure, they should lay you on the ground to prevent a fall, protect your head and neck, and turn you onto your side. This information is not intended to replace advice given to you by your health care provider. Make sure you discuss any questions you have with your health care provider. Document Released: 02/14/2005 Document Revised: 12/12/2016 Document Reviewed: 10/12/2015 Elsevier Patient Education  2020 [employer].

## 2018-12-21 ENCOUNTER — Encounter (HOSPITAL_COMMUNITY): Payer: Self-pay | Admitting: Emergency Medicine

## 2018-12-21 ENCOUNTER — Emergency Department (HOSPITAL_COMMUNITY): Payer: PPO

## 2018-12-21 ENCOUNTER — Emergency Department (HOSPITAL_COMMUNITY)
Admission: EM | Admit: 2018-12-21 | Discharge: 2018-12-22 | Disposition: A | Discharge status: Home / Self Care | Payer: PPO | Attending: Emergency Medicine | Admitting: Emergency Medicine

## 2018-12-21 ENCOUNTER — Telehealth: Payer: Self-pay | Admitting: Neurology

## 2018-12-21 ENCOUNTER — Other Ambulatory Visit: Payer: Self-pay

## 2018-12-21 DIAGNOSIS — S199XXA Unspecified injury of neck, initial encounter: Secondary | ICD-10-CM | POA: Diagnosis not present

## 2018-12-21 DIAGNOSIS — S0990XA Unspecified injury of head, initial encounter: Secondary | ICD-10-CM | POA: Diagnosis not present

## 2018-12-21 DIAGNOSIS — N3001 Acute cystitis with hematuria: Secondary | ICD-10-CM

## 2018-12-21 DIAGNOSIS — Z79899 Other long term (current) drug therapy: Secondary | ICD-10-CM | POA: Insufficient documentation

## 2018-12-21 DIAGNOSIS — Z96653 Presence of artificial knee joint, bilateral: Secondary | ICD-10-CM | POA: Insufficient documentation

## 2018-12-21 DIAGNOSIS — R42 Dizziness and giddiness: Secondary | ICD-10-CM | POA: Diagnosis not present

## 2018-12-21 DIAGNOSIS — Z91040 Latex allergy status: Secondary | ICD-10-CM | POA: Diagnosis not present

## 2018-12-21 LAB — BASIC METABOLIC PANEL
Anion gap: 12 (ref 5–15)
BUN: 11 mg/dL (ref 6–20)
CO2: 25 mmol/L (ref 22–32)
Calcium: 9.4 mg/dL (ref 8.9–10.3)
Chloride: 97 mmol/L — ABNORMAL LOW (ref 98–111)
Creatinine, Ser: 0.7 mg/dL (ref 0.44–1.00)
GFR calc Af Amer: >60 mL/min (ref >60)
GFR calc non Af Amer: >60 mL/min (ref >60)
Glucose, Bld: 101 mg/dL — ABNORMAL HIGH (ref 70–99)
Potassium: 3.9 mmol/L (ref 3.5–5.1)
Sodium: 134 mmol/L — ABNORMAL LOW (ref 135–145)

## 2018-12-21 LAB — CBC
HCT: 38.2 % (ref 36.0–46.0)
Hemoglobin: 12.6 g/dL (ref 12.0–15.0)
MCH: 30.4 pg (ref 26.0–34.0)
MCHC: 33 g/dL (ref 30.0–36.0)
MCV: 92.3 fL (ref 80.0–100.0)
Platelets: 285 10*3/uL (ref 150–400)
RBC: 4.14 MIL/uL (ref 3.87–5.11)
RDW: 13.5 % (ref 11.5–15.5)
WBC: 16.2 10*3/uL — ABNORMAL HIGH (ref 4.0–10.5)
nRBC: 0 % (ref 0.0–0.2)

## 2018-12-21 LAB — URINALYSIS, ROUTINE W REFLEX MICROSCOPIC
Bilirubin Urine: NEGATIVE
Glucose, UA: NEGATIVE mg/dL
Ketones, ur: NEGATIVE mg/dL
Nitrite: NEGATIVE
Protein, ur: 30 mg/dL — AB
Specific Gravity, Urine: 1.019 (ref 1.005–1.030)
WBC, UA: >50 WBC/hpf — ABNORMAL HIGH (ref 0–5)
pH: 6 (ref 5.0–8.0)

## 2018-12-21 NOTE — Telephone Encounter (Signed)
Pt called and LVM stating that she is still falling and has fallen 3 more times since her last visit. Please advise.

## 2018-12-21 NOTE — ED Notes (Signed)
Pt states she fell due to dizziness last night and struck head on the floor. Denies any other injuries.

## 2018-12-21 NOTE — ED Triage Notes (Signed)
Dizziness on and off x 2 months.  Pt states she Seen her neurologist on dec 3rd and told her she had been having dizziness, states neurologist  did not do anything.

## 2018-12-21 NOTE — ED Notes (Signed)
Patient transported to CT 

## 2018-12-21 NOTE — ED Provider Notes (Signed)
[facility] EMERGENCY DEPARTMENT Provider Note   CSN: [id number] Arrival date & time: 12/21/18  1403     History   Chief Complaint Chief Complaint  Patient presents with   Dizziness    HPI [name] is a 53 y.o. female.     Patient presents to the emergency department for evaluation of dizziness.  Patient reports that she has been experiencing intermittent dizzy spells for a couple of months.  She denies vertiginous type dizziness, just feels off balance when it occurs.  She reports that the symptoms will come and go and she has not identified anything that causes it.  It usually lasts for a few minutes, and then wax and wanes.  Does not generally have a headache associated with the dizziness but does have one today.  She did fall last night because of the dizziness.  No associated chest pain, heart palpitations.  She has not had any blood loss, denies hematemesis rectal bleeding.  No fever or infectious symptoms.     Past Medical History:  Diagnosis Date   Arthritis    "knees" (04/22/2012)   Chronic bronchitis ([place])    "yearly; when the weather changes" (04/22/2012)   Colon polyp    Colon polyps    adenomatous and hyperplastic-   Depression    Eczema    Epilepsy ([place])    "been having them right often here lately" (04/22/2012)   Fatty liver    High cholesterol    History of kidney stones    Osteoarthritis    /notes 04/22/2012   Other convulsions 05/21/12   non-epileptic spells   Restless leg    Seizures ([place])    Vertigo     Patient Active Problem List   Diagnosis Date Noted   Cardiac arrest ([place]) 12/15/2018   MDD (major depressive disorder), recurrent episode, 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/08/2018   Widowed - July 2019 08/07/2017   Sleep apnea 06/09/2017   Colon polyp 06/05/2017   Elevated liver function tests 06/05/2017   Fatty liver    Mild neurocognitive disorder 02/04/2017   Primary insomnia 05/19/2016   Bereavement 12/25/2015   Partial  symptomatic epilepsy with complex partial seizures, not intractable, with status epilepticus ([place]) 07/24/2015   OA (osteoarthritis) of knee 05/07/2015   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    Past Surgical History:  Procedure Laterality Date   ABDOMINAL HYSTERECTOMY  2001   BLADDER SUSPENSION     BUNIONECTOMY Left 2000   CESAREAN SECTION  1987; 1988   COLONOSCOPY     JOINT REPLACEMENT     MASS EXCISION  10/22/2011   Procedure: EXCISION MASS;  Surgeon: [name], MD;  Location: [facility];  Service: General;  Laterality: Right;  excision right buttock mass   TOTAL HIP ARTHROPLASTY Left 1993; 1995; 2000   TOTAL KNEE ARTHROPLASTY Left 05/07/2015   Procedure: TOTAL LEFT KNEE ARTHROPLASTY;  Surgeon: [name], MD;  Location: [facility];  Service: Orthopedics;  Laterality: Left;   TOTAL KNEE ARTHROPLASTY Right 10/01/2015   Procedure: RIGHT TOTAL KNEE ARTHROPLASTY;  Surgeon: [name], MD;  Location: [facility];  Service: Orthopedics;  Laterality: Right;     OB History   No obstetric history on file.      Home Medications    Prior to Admission medications   Medication Sig Start Date End Date Taking? Authorizing Provider  cephALEXin (KEFLEX) 500 MG capsule Take 1 capsule (500 mg total) by mouth 2 (two) times daily. 12/22/18   Orpah Greek, MD  diclofenac (VOLTAREN) 75 MG EC tablet Take 1 tablet (75 mg total) by mouth 2 (two) times daily. 10/19/18   Vivi Barrack, MD  FLUoxetine (PROZAC) 40 MG capsule Take 2 capsules (80 mg total) by mouth daily. 11/11/18   Norman Clay, MD  gabapentin (NEURONTIN) 600 MG tablet Take 1 tablet (600 mg total) by mouth 3 (three) times daily. 05/28/18   [name], MD  lamoTRIgine (LAMICTAL) 150 MG tablet Take 1 tablet (150 mg total) by mouth 2 (two) times daily. For mood control 12/15/18   Dohmeier, Asencion Partridge, MD  levETIRAcetam (KEPPRA) 1000 MG tablet Take 1 in the morning and 1 tablet at bedtime. 12/15/18   Dohmeier, Asencion Partridge, MD  LORazepam  (ATIVAN) 1 MG tablet Take 1 tablet (1 mg total) by mouth daily as needed for anxiety. 08/18/18   Norman Clay, MD  meclizine (ANTIVERT) 25 MG tablet Take 1 tablet (25 mg total) by mouth 3 (three) times daily as needed for dizziness. 11/05/18   [name], MD  pramipexole (MIRAPEX) 0.5 MG tablet Take 1 tablet by mouth twice daily 09/14/18   Lomax, Amy, NP  tiZANidine (ZANAFLEX) 4 MG tablet Take 1 tablet (4 mg total) by mouth every 6 (six) hours as needed for muscle spasms. 10/19/18   Vivi Barrack, MD    Family History Family History  Problem Relation Age of Onset   Cancer Mother    Depression Father    Cancer Brother    Diabetes Brother    Cancer Maternal Aunt    Diabetes Maternal Aunt    Bipolar disorder Son    Drug abuse Son    Heart attack Sister 74       during severe illness    Social History Social History   Tobacco Use   Smoking status: Never Smoker   Smokeless tobacco: Never Used  Substance Use Topics   Alcohol use: No    Alcohol/week: 0.0 standard drinks    Comment: 12-25-2015 per pt no   Drug use: No    Comment: 21-12-2015 per pt no      Allergies   Acetaminophen, Benadryl [diphenhydramine hcl], Codeine, Dilantin [phenytoin sodium extended], Melatonin, Tramadol, Ultram [tramadol hcl], Vimpat [lacosamide], Mirtazapine, Betadine [povidone iodine], Latex, Penicillins, Sulfa antibiotics, Tape, and Vicodin [hydrocodone-acetaminophen]   Review of Systems Review of Systems  Neurological: Positive for dizziness and headaches.  All other systems reviewed and are negative.    Physical Exam Updated Vital Signs BP 125/75    Pulse 74    Temp 98.1 F (36.7 C) (Oral)    Resp 17    Ht 5' (1.524 m)    Wt 70.3 kg    SpO2 98%    BMI 30.27 kg/m   Physical Exam Vitals signs and nursing note reviewed.  Constitutional:      General: She is not in acute distress.    Appearance: Normal appearance. She is well-developed.  HENT:     Head: Normocephalic and  atraumatic.     Right Ear: Hearing normal.     Left Ear: Hearing normal.     Nose: Nose normal.  Eyes:     Conjunctiva/sclera: Conjunctivae normal.     Pupils: Pupils are equal, round, and reactive to light.  Neck:     Musculoskeletal: Normal range of motion and neck supple.  Cardiovascular:     Rate and Rhythm: Regular rhythm.     Heart sounds: S1 normal and S2 normal. No murmur. No friction rub. No gallop.  Pulmonary:     Effort: Pulmonary effort is normal. No respiratory distress.     Breath sounds: Normal breath sounds.  Chest:     Chest wall: No tenderness.  Abdominal:     General: Bowel sounds are normal.     Palpations: Abdomen is soft.     Tenderness: There is no abdominal tenderness. There is no guarding or rebound. Negative signs include Murphy's sign and McBurney's sign.     Hernia: No hernia is present.  Musculoskeletal: Normal range of motion.  Skin:    General: Skin is warm and dry.     Findings: No rash.  Neurological:     Mental Status: She is alert and oriented to person, place, and time.     GCS: GCS eye subscore is 4. GCS verbal subscore is 5. GCS motor subscore is 6.     Cranial Nerves: No cranial nerve deficit.     Sensory: No sensory deficit.     Coordination: Coordination normal.  Psychiatric:        Speech: Speech normal.        Behavior: Behavior normal.        Thought Content: Thought content normal.      ED Treatments / Results  Labs (all labs ordered are listed, but only abnormal results are displayed) Labs Reviewed  BASIC METABOLIC PANEL - Abnormal; Notable for the following components:      Result Value   Sodium 134 (*)    Chloride 97 (*)    Glucose, Bld 101 (*)    All other components within normal limits  CBC - Abnormal; Notable for the following components:   WBC 16.2 (*)    All other components within normal limits  URINALYSIS, ROUTINE W REFLEX MICROSCOPIC - Abnormal; Notable for the following components:   APPearance CLOUDY (*)     Hgb urine dipstick SMALL (*)    Protein, ur 30 (*)    Leukocytes,Ua LARGE (*)    WBC, UA >50 (*)    Bacteria, UA RARE (*)    All other components within normal limits  URINE CULTURE    EKG EKG Interpretation  Date/Time:  Tuesday December 21 2018 22:13:35 EST Ventricular Rate:  79 PR Interval:    QRS Duration: 107 QT Interval:  420 QTC Calculation: 482 R Axis:   68 Text Interpretation: Sinus rhythm Minimal ST depression, diffuse leads Baseline wander in lead(s) II V4 V5 V6 No significant change since last tracing Confirmed by Orpah Greek [phone]) on 12/21/2018 11:20:35 PM   Radiology CT Head Wo Contrast  Result Date: 12/21/2018 CLINICAL DATA:  Fall EXAM: CT HEAD WITHOUT CONTRAST CT CERVICAL SPINE WITHOUT CONTRAST TECHNIQUE: Multidetector CT imaging of the head and cervical spine was performed following the standard protocol without intravenous contrast. Multiplanar CT image reconstructions of the cervical spine were also generated. COMPARISON:  None. FINDINGS: CT HEAD FINDINGS Brain: There is no mass, hemorrhage or extra-axial collection. The size and configuration of the ventricles and extra-axial CSF spaces are normal. The brain parenchyma is normal, without evidence of acute or chronic infarction. Vascular: No abnormal hyperdensity of the major intracranial arteries or dural venous sinuses. No intracranial atherosclerosis. Skull: The visualized skull base, calvarium and extracranial soft tissues are normal. Sinuses/Orbits: No fluid levels or advanced mucosal thickening of the visualized paranasal sinuses. No mastoid or middle ear effusion. The orbits are normal. CT CERVICAL SPINE FINDINGS Alignment: Grade 1 anterolisthesis at C4-5 due to facet hypertrophy. Skull base and vertebrae:  No acute fracture. Soft tissues and spinal canal: No prevertebral fluid or swelling. No visible canal hematoma. Disc levels: Multilevel facet arthrosis. No bony spinal canal stenosis. Upper chest:  No pneumothorax, pulmonary nodule or pleural effusion. Other: Normal visualized paraspinal cervical soft tissues. IMPRESSION: 1. Normal head CT. 2. No acute fracture or static subluxation of the cervical spine. Electronically Signed   By: Ulyses Jarred M.D.   On: 12/21/2018 23:36   CT Cervical Spine Wo Contrast  Result Date: 12/21/2018 CLINICAL DATA:  Fall EXAM: CT HEAD WITHOUT CONTRAST CT CERVICAL SPINE WITHOUT CONTRAST TECHNIQUE: Multidetector CT imaging of the head and cervical spine was performed following the standard protocol without intravenous contrast. Multiplanar CT image reconstructions of the cervical spine were also generated. COMPARISON:  None. FINDINGS: CT HEAD FINDINGS Brain: There is no mass, hemorrhage or extra-axial collection. The size and configuration of the ventricles and extra-axial CSF spaces are normal. The brain parenchyma is normal, without evidence of acute or chronic infarction. Vascular: No abnormal hyperdensity of the major intracranial arteries or dural venous sinuses. No intracranial atherosclerosis. Skull: The visualized skull base, calvarium and extracranial soft tissues are normal. Sinuses/Orbits: No fluid levels or advanced mucosal thickening of the visualized paranasal sinuses. No mastoid or middle ear effusion. The orbits are normal. CT CERVICAL SPINE FINDINGS Alignment: Grade 1 anterolisthesis at C4-5 due to facet hypertrophy. Skull base and vertebrae: No acute fracture. Soft tissues and spinal canal: No prevertebral fluid or swelling. No visible canal hematoma. Disc levels: Multilevel facet arthrosis. No bony spinal canal stenosis. Upper chest: No pneumothorax, pulmonary nodule or pleural effusion. Other: Normal visualized paraspinal cervical soft tissues. IMPRESSION: 1. Normal head CT. 2. No acute fracture or static subluxation of the cervical spine. Electronically Signed   By: Ulyses Jarred M.D.   On: 12/21/2018 23:36    Procedures Procedures (including critical  care time)  Medications Ordered in ED Medications  cefTRIAXone (ROCEPHIN) injection 1 g (1 g Intramuscular Given 12/22/18 0053)  lidocaine (PF) (XYLOCAINE) 1 % injection (2.1 mLs  Given 12/22/18 0052)     Initial Impression / Assessment and Plan / ED Course  I have reviewed the triage vital signs and the nursing notes.  Pertinent labs & imaging results that were available during my care of the patient were reviewed by me and considered in my medical decision making (see chart for details).        Patient presents to the emergency department for evaluation of dizziness.  This seems to be a somewhat chronic problem for her.  It has been ongoing for months.  She reports that she has talked to her neurologist about this but hasn't received any additional orders.  She reports some worsening of her symptoms over the last couple of days, this caused a fall last night.  No injury from the fall.  CT head and cervical spine negative.  Blood work reveals leukocytosis without any other abnormalities.  She does not have any focal neurologic findings on exam.  No concern for stroke.  Urinalysis does suggest obvious infection.  Will treat for UTI, refer back to her neurologist.  Final Clinical Impressions(s) / ED Diagnoses   Final diagnoses:  Dizziness  Acute cystitis with hematuria    ED Discharge Orders         Ordered    cephALEXin (KEFLEX) 500 MG capsule  2 times daily     12/22/18 0031           Yaviel Kloster, Gwenyth Allegra, MD  12/23/18 0510 ° °

## 2018-12-22 MED ORDER — CEFTRIAXONE SODIUM 1 G IJ SOLR
1.0000 g | Freq: Once | INTRAMUSCULAR | Status: AC
  Administered 2018-12-22: 1 g via INTRAMUSCULAR
  Filled 2018-12-22: qty 10

## 2018-12-22 MED ORDER — CEPHALEXIN 500 MG PO CAPS: 500.0000 mg | ORAL_CAPSULE | Freq: Two times a day (BID) | ORAL | 0 refills | Status: DC

## 2018-12-22 MED ORDER — LIDOCAINE HCL (PF) 1 % IJ SOLN
INTRAMUSCULAR | Status: AC
  Administered 2018-12-22: 2.1 mL
  Filled 2018-12-22: qty 2

## 2018-12-22 NOTE — Discharge Instructions (Addendum)
Please schedule follow-up with your neurologist to further discuss your ongoing problems with dizziness

## 2018-12-22 NOTE — Telephone Encounter (Signed)
I went to call the patient back yesterday and saw the patient was in the emergency room. I will follow notes from ED.

## 2018-12-23 LAB — URINE CULTURE: Culture: <10000 — AB

## 2018-12-30 ENCOUNTER — Ambulatory Visit (INDEPENDENT_AMBULATORY_CARE_PROVIDER_SITE_OTHER): Payer: PPO | Admitting: Psychiatry

## 2018-12-30 ENCOUNTER — Encounter: Payer: Self-pay | Admitting: Psychiatry

## 2018-12-30 ENCOUNTER — Other Ambulatory Visit: Payer: Self-pay

## 2018-12-30 DIAGNOSIS — F419 Anxiety disorder, unspecified: Secondary | ICD-10-CM

## 2018-12-30 DIAGNOSIS — F3342 Major depressive disorder, recurrent, in full remission: Secondary | ICD-10-CM

## 2018-12-30 MED ORDER — FLUOXETINE HCL 40 MG PO CAPS: 80.0000 mg | ORAL_CAPSULE | Freq: Every day | ORAL | 0 refills | Status: DC

## 2018-12-30 MED ORDER — HYDROXYZINE HCL 25 MG PO TABS: 25.0000 mg | ORAL_TABLET | Freq: Two times a day (BID) | ORAL | 0 refills | Status: DC | PRN

## 2018-12-30 MED ORDER — LORAZEPAM 1 MG PO TABS: 1.0000 mg | ORAL_TABLET | Freq: Every day | ORAL | 2 refills | Status: DC | PRN

## 2018-12-30 NOTE — Progress Notes (Signed)
[facility] OP Progress Note  I connected with  [name] on 12/30/18 by phone and verified that I am speaking with the correct person using two identifiers.   I discussed the limitations of evaluation and management by phone. The patient expressed understanding and agreed to proceed.    12/30/2018 8:41 AM [name]  MRN:  [id number]  Chief Complaint: " I am doing better."  HPI: [name] is a 53 y.o. year old female with a history of depression, pseudoseizure,partialsymptomatic epilepsy with complex partial seizures, osteoarthritis, congenital hip dislocation s/p replacement, who presents for follow up appointment for depression.  Patient reported doing well on her current regimen.  She informed that she has had some back leg seizures and therefore she has been anxious.  She has been using lorazepam more than once a day on some occasions.  She sometimes has difficulty falling asleep.  Overall she feels she is doing better and would like to keep her regimen the same way. She is prescribed Keppra and Lamictal and gabapentin for seizures.  She is also on pramipexole for restless leg syndrome.  Visit Diagnosis:    ICD-10-CM   1. Anxiety  F41.9 FLUoxetine (PROZAC) 40 MG capsule    LORazepam (ATIVAN) 1 MG tablet    hydrOXYzine (ATARAX/VISTARIL) 25 MG tablet  2. MDD (major depressive disorder), recurrent, in full remission ([place])  F33.42 FLUoxetine (PROZAC) 40 MG capsule    Past Psychiatric History: Depression, anxiety  Past Medical History:  Past Medical History:  Diagnosis Date  . Arthritis    "knees" (04/22/2012)  . Chronic bronchitis ([place])    "yearly; when the weather changes" (04/22/2012)  . Colon polyp   . Colon polyps    adenomatous and hyperplastic-  . Depression   . Eczema   . Epilepsy ([place])    "been having them right often here lately" (04/22/2012)  . Fatty liver   . High cholesterol   . History of kidney stones   . Osteoarthritis    [name] 04/22/2012  . Other  convulsions 05/21/12   non-epileptic spells  . Restless leg   . Seizures ([place])   . Vertigo     Past Surgical History:  Procedure Laterality Date  . ABDOMINAL HYSTERECTOMY  2001  . BLADDER SUSPENSION    . BUNIONECTOMY Left 2000  . CESAREAN SECTION  1987; 1988  . COLONOSCOPY    . JOINT REPLACEMENT    . MASS EXCISION  10/22/2011   Procedure: EXCISION MASS;  Surgeon: [name], MD;  Location: [facility];  Service: General;  Laterality: Right;  excision right buttock mass  . TOTAL HIP ARTHROPLASTY Left 1993; 1995; 2000  . TOTAL KNEE ARTHROPLASTY Left 05/07/2015   Procedure: TOTAL LEFT KNEE ARTHROPLASTY;  Surgeon: [name], MD;  Location[facility];  Service: Orthopedics;  Laterality: Left;  . TOTAL KNEE ARTHROPLASTY Right 10/01/2015   Procedure: RIGHT TOTAL KNEE ARTHROPLASTY;  Surgeon: [name], MD;  Location: [facility];  Service: Orthopedics;  Laterality: Right;    Family Psychiatric History: See below  Family History:  Family History  Problem Relation Age of Onset  . Cancer Mother   . Depression Father   . Cancer Brother   . Diabetes Brother   . Cancer Maternal Aunt   . Diabetes Maternal Aunt   . Bipolar disorder Son   . Drug abuse Son   . Heart attack Sister 61       during severe illness    Social History:  Social History   Socioeconomic History  . Marital status: Widowed    Spouse name: [name]  . Number of children: 2  . Years of education: 12 +  . Highest education level: Not on file  Occupational History  . Occupation: disability, medical  Tobacco Use  . Smoking status: Never Smoker  . Smokeless tobacco: Never Used  Substance and Sexual Activity  . Alcohol use: No    Alcohol/week: 0.0 standard drinks    Comment: 12-25-2015 per pt no  . Drug use: No    Comment: 21-12-2015 per pt no   . Sexual activity: Not Currently    Birth control/protection: Surgical  Other Topics Concern  . Not on file  Social History Narrative   Patient has two adult children.    Patient is disabled.   Patient has a high school education and some trade school.   Patient is right-handed.   Patient drinks four glasses of soda and tea daily.   Social Determinants of Health   Financial Resource Strain:   . Difficulty of Paying Living Expenses: Not on file  Food Insecurity:   . Worried About Charity fundraiser in the Last Year: Not on file  . Ran Out of Food in the Last Year: Not on file  Transportation Needs:   . Lack of Transportation (Medical): Not on file  . Lack of Transportation (Non-Medical): Not on file  Physical Activity:   . Days of Exercise per Week: Not on file  . Minutes of Exercise per Session: Not on file  Stress:   . Feeling of Stress : Not on file  Social Connections:   . Frequency of Communication with Friends and Family: Not on file  . Frequency of Social Gatherings with Friends and Family: Not on file  . Attends Religious Services: Not on file  . Active Member of Clubs or Organizations: Not on file  . Attends Archivist Meetings: Not on file  . Marital Status: Not on file    Allergies:  Allergies  Allergen Reactions  . Acetaminophen Other (See Comments)    Has fatty deposits on liver  . Benadryl [Diphenhydramine Hcl] Other (See Comments)    hyperactivity and seizures  . Codeine Itching and Rash    seizures  . Dilantin [Phenytoin Sodium Extended] Other (See Comments)    Elevated LFT's  . Melatonin Other (See Comments)    seizures  . Tramadol Other (See Comments)    "causes seizures"  . Ultram [Tramadol Hcl] Other (See Comments)    Seizures  . Vimpat [Lacosamide] Other (See Comments)    Severe dizziness  . Mirtazapine Other (See Comments)    Potentiated seizure activity  . Betadine [Povidone Iodine] Rash  . Latex Rash  . Penicillins Itching and Rash    Has patient had a PCN reaction causing immediate rash, facial/tongue/throat swelling, SOB or lightheadedness with hypotension: No Has patient had a PCN reaction  causing severe rash involving mucus membranes or skin necrosis: No Has patient had a PCN reaction that required hospitalization No Has patient had a PCN reaction occurring within the last 10 years: No If all of the above answers are "NO", then may proceed with Cephalosporin use.  . Sulfa Antibiotics Rash  . Tape Rash and Other (See Comments)    Paper tape please Paper tape please  . Vicodin [Hydrocodone-Acetaminophen] Itching    Metabolic Disorder Labs: Lab Results  Component Value Date   HGBA1C 5.4 11/28/2015   MPG 108 11/28/2015  No results found for: PROLACTIN Lab Results  Component Value Date   CHOL 151 09/21/2017   TRIG 124.0 09/21/2017   HDL 53.10 09/21/2017   CHOLHDL 3 09/21/2017   VLDL 24.8 09/21/2017   LDLCALC 73 09/21/2017   LDLCALC 86 07/07/2016   Lab Results  Component Value Date   TSH 1.52 09/21/2017   TSH 1.900 07/07/2016    Therapeutic Level Labs: No results found for: LITHIUM No results found for: VALPROATE No components found for:  CBMZ  Current Medications: Current Outpatient Medications  Medication Sig Dispense Refill  . cephALEXin (KEFLEX) 500 MG capsule Take 1 capsule (500 mg total) by mouth 2 (two) times daily. 14 capsule 0  . diclofenac (VOLTAREN) 75 MG EC tablet Take 1 tablet (75 mg total) by mouth 2 (two) times daily. 30 tablet 0  . FLUoxetine (PROZAC) 40 MG capsule Take 2 capsules (80 mg total) by mouth daily. 180 capsule 0  . gabapentin (NEURONTIN) 600 MG tablet Take 1 tablet (600 mg total) by mouth 3 (three) times daily. 270 tablet 3  . hydrOXYzine (ATARAX/VISTARIL) 25 MG tablet Take 1 tablet (25 mg total) by mouth 2 (two) times daily as needed for anxiety. 180 tablet 0  . lamoTRIgine (LAMICTAL) 150 MG tablet Take 1 tablet (150 mg total) by mouth 2 (two) times daily. For mood control 60 tablet 11  . levETIRAcetam (KEPPRA) 1000 MG tablet Take 1 in the morning and 1 tablet at bedtime. 60 tablet 11  . LORazepam (ATIVAN) 1 MG tablet Take 1  tablet (1 mg total) by mouth daily as needed for anxiety. 30 tablet 2  . meclizine (ANTIVERT) 25 MG tablet Take 1 tablet (25 mg total) by mouth 3 (three) times daily as needed for dizziness. 30 tablet 0  . pramipexole (MIRAPEX) 0.5 MG tablet Take 1 tablet by mouth twice daily 60 tablet 5  . tiZANidine (ZANAFLEX) 4 MG tablet Take 1 tablet (4 mg total) by mouth every 6 (six) hours as needed for muscle spasms. 30 tablet 0   No current facility-administered medications for this visit.     Musculoskeletal: Strength & Muscle Tone: unable to assess due to telemed visit Gait & Station: unable to assess due to telemed visit Patient leans: unable to assess due to telemed visit  Psychiatric Specialty Exam: Review of Systems  There were no vitals taken for this visit.There is no height or weight on file to calculate BMI.  General Appearance: Unable to assess due to phone visit  Eye Contact:  Unable to assess due to phone visit  Speech:  Clear and Coherent and Normal Rate  Volume:  Normal  Mood:  Euthymic  Affect:  Congruent  Thought Process:  Goal Directed, Linear and Descriptions of Associations: Intact  Orientation:  Full (Time, Place, and Person)  Thought Content: Logical   Suicidal Thoughts:  No  Homicidal Thoughts:  No  Memory:  Recent;   Good Remote;   Good  Judgement:  Fair  Insight:  Fair  Psychomotor Activity:  Normal  Concentration:  Concentration: Good and Attention Span: Good  Recall:  Good  Fund of Knowledge: Good  Language: Good  Akathisia:  Negative  Handed:  Right  AIMS (if indicated):not done  Assets:  Communication Skills Desire for Improvement Financial Resources/Insurance Social Support  ADL's:  Intact  Cognition: WNL  Sleep:  Fair   Screenings: AIMS     ED to Hosp-Admission (Discharged) from 10/30/2017 in [facility]  AIMS Total  Score  0    AUDIT     ED to Hosp-Admission (Discharged) from 10/30/2017 in [facility]  Alcohol Use Disorder Identification Test Final Score (AUDIT)  0    GAD-7     Office Visit from 01/31/2015 in [facility]  Total GAD-7 Score  15    Mini-Mental     Office Visit from 02/20/2014 in [facility]  Total Score (max 30 points )  30    PHQ2-9     Patient Outreach from 08/31/2017 in [facility] Visit from 04/17/2017 in [facility] Office Visit from 04/13/2017 in [facility] Office Visit from 02/03/2017 in [facility] Office Visit from 01/30/2017 in [facility]  PHQ-2 Total Score  2  1  2  2   0  PHQ-9 Total Score  14  --  17  13  --       Assessment and Plan: Patient reported doing well on her regimen.  She reported occasional anxiety due to back-to-back seizures.  She is under the care of neurology for follow-up of seizures.  1. MDD (major depressive disorder), recurrent, in full remission ([place])  - FLUoxetine (PROZAC) 40 MG capsule; Take 2 capsules (80 mg total) by mouth daily.  Dispense: 180 capsule; Refill: 0  2. Anxiety  - FLUoxetine (PROZAC) 40 MG capsule; Take 2 capsules (80 mg total) by mouth daily.  Dispense: 180 capsule; Refill: 0 - LORazepam (ATIVAN) 1 MG tablet; Take 1 tablet (1 mg total) by mouth daily as needed for anxiety.  Dispense: 30 tablet; Refill: 2 - hydrOXYzine (ATARAX/VISTARIL) 25 MG tablet; Take 1 tablet (25 mg total) by mouth 2 (two) times daily as needed for anxiety.  Dispense: 180 tablet; Refill: 0  Continue same medication regimen. Follow up in 3 months.   Nevada Crane, MD 12/30/2018, 8:41 AM

## 2019-01-13 ENCOUNTER — Ambulatory Visit: Payer: PPO

## 2019-02-03 ENCOUNTER — Other Ambulatory Visit: Payer: Self-pay | Admitting: Neurology

## 2019-02-03 ENCOUNTER — Telehealth: Payer: Self-pay

## 2019-02-03 DIAGNOSIS — F445 Conversion disorder with seizures or convulsions: Secondary | ICD-10-CM

## 2019-02-03 MED ORDER — LEVETIRACETAM 1000 MG PO TABS: ORAL_TABLET | ORAL | 0 refills | Status: DC

## 2019-02-03 NOTE — Telephone Encounter (Signed)
Refill sent for the patient for a 90 days supply

## 2019-02-03 NOTE — Telephone Encounter (Signed)
1) Medication(s) Requested (by name): keppra 2) Pharmacy of Choice: [facility] [address] [place] - [place] [address]  [place], [place] [address]

## 2019-03-21 NOTE — Progress Notes (Signed)
Virtual Visit via Telephone Note  I connected with [name] on 03/30/19 at  9:00 AM EDT by telephone and verified that I am speaking with the correct person using two identifiers.   I discussed the limitations, risks, security and privacy concerns of performing an evaluation and management service by telephone and the availability of in person appointments. I also discussed with the patient that there may be a patient responsible charge related to this service. The patient expressed understanding and agreed to proceed.     I discussed the assessment and treatment plan with the patient. The patient was provided an opportunity to ask questions and all were answered. The patient agreed with the plan and demonstrated an understanding of the instructions.   The patient was advised to call back or seek an in-person evaluation if the symptoms worsen or if the condition fails to improve as anticipated.  I provided 9 minutes of non-face-to-face time during this encounter.   Norman Clay, MD    [facility] MD/PA/NP OP Progress Note  03/30/2019 9:25 AM [name]  MRN:  [id number]  Chief Complaint:  Chief Complaint    Depression; Follow-up     HPI:  This is a follow-up appointment for depression.  She states that she has been doing well, although she occasionally feels "up and down."  She enjoys meeting with her sister-in-law, who she has close relationship with.  She ate lunch outside yesterday. She keeps the relationship with a man, and she introduced him to her family.  She has been feeling dizzy, and sees a provider.  She had one seizure in the last two months, which has been improving compared to before.  She sleeps well.  She has good appetite and motivation.  She has fair concentration.  She denies SI.  She feels anxious and tense at times.  She had a panic attack when she was at the grocery store.  She takes Ativan only when she has seizure-like episode.  She took it about a week ago.   She takes hydroxyzine every day.   Visit Diagnosis:    ICD-10-CM   1. MDD (major depressive disorder), recurrent, in partial remission ([facility])  F33.41 FLUoxetine (PROZAC) 40 MG capsule  2. Anxiety  F41.9 FLUoxetine (PROZAC) 40 MG capsule    Past Psychiatric History: Please see initial evaluation for full details. I have reviewed the history. No updates at this time.     Past Medical History:  Past Medical History:  Diagnosis Date  . Arthritis    "knees" (04/22/2012)  . Chronic bronchitis ([place])    "yearly; when the weather changes" (04/22/2012)  . Colon polyp   . Colon polyps    adenomatous and hyperplastic-  . Depression   . Eczema   . Epilepsy ([place])    "been having them right often here lately" (04/22/2012)  . Fatty liver   . High cholesterol   . History of kidney stones   . Osteoarthritis    [name] 04/22/2012  . Other convulsions 05/21/12   non-epileptic spells  . Restless leg   . Seizures ([place])   . Vertigo     Past Surgical History:  Procedure Laterality Date  . ABDOMINAL HYSTERECTOMY  2001  . BLADDER SUSPENSION    . BUNIONECTOMY Left 2000  . CESAREAN SECTION  1987; 1988  . COLONOSCOPY    . JOINT REPLACEMENT    . MASS EXCISION  10/22/2011   Procedure: EXCISION MASS;  Surgeon: [name], MD;  Location: [facility];  Service: General;  Laterality: Right;  excision right buttock mass  . TOTAL HIP ARTHROPLASTY Left 1993; 1995; 2000  . TOTAL KNEE ARTHROPLASTY Left 05/07/2015   Procedure: TOTAL LEFT KNEE ARTHROPLASTY;  Surgeon: [name], MD;  [facility];  Service: Orthopedics;  Laterality: Left;  . TOTAL KNEE ARTHROPLASTY Right 10/01/2015   Procedure: RIGHT TOTAL KNEE ARTHROPLASTY;  Surgeon: [name], MD;  [facility];  Service: Orthopedics;  Laterality: Right;    Family Psychiatric History: Please see initial evaluation for full details. I have reviewed the history. No updates at this time.     Family History:  Family History  Problem Relation  Age of Onset  . Cancer Mother   . Depression Father   . Cancer Brother   . Diabetes Brother   . Cancer Maternal Aunt   . Diabetes Maternal Aunt   . Bipolar disorder Son   . Drug abuse Son   . Heart attack Sister 78       during severe illness    Social History:  Social History   Socioeconomic History  . Marital status: Widowed    Spouse name: [name]  . Number of children: 2  . Years of education: 12 +  . Highest education level: Not on file  Occupational History  . Occupation: disability, medical  Tobacco Use  . Smoking status: Never Smoker  . Smokeless tobacco: Never Used  Substance and Sexual Activity  . Alcohol use: No    Alcohol/week: 0.0 standard drinks    Comment: 12-25-2015 per pt no  . Drug use: No    Comment: 21-12-2015 per pt no   . Sexual activity: Not Currently    Birth control/protection: Surgical  Other Topics Concern  . Not on file  Social History Narrative   Patient has two adult children.   Patient is disabled.   Patient has a high school education and some trade school.   Patient is right-handed.   Patient drinks four glasses of soda and tea daily.   Social Determinants of Health   Financial Resource Strain:   . Difficulty of Paying Living Expenses:   Food Insecurity:   . Worried About Charity fundraiser in the Last Year:   . Arboriculturist in the Last Year:   Transportation Needs:   . Film/video editor (Medical):   Marland Kitchen Lack of Transportation (Non-Medical):   Physical Activity:   . Days of Exercise per Week:   . Minutes of Exercise per Session:   Stress:   . Feeling of Stress :   Social Connections:   . Frequency of Communication with Friends and Family:   . Frequency of Social Gatherings with Friends and Family:   . Attends Religious Services:   . Active Member of Clubs or Organizations:   . Attends Archivist Meetings:   Marland Kitchen Marital Status:     Allergies:  Allergies  Allergen Reactions  . Acetaminophen Other (See  Comments)    Has fatty deposits on liver  . Benadryl [Diphenhydramine Hcl] Other (See Comments)    hyperactivity and seizures  . Codeine Itching and Rash    seizures  . Dilantin [Phenytoin Sodium Extended] Other (See Comments)    Elevated LFT's  . Melatonin Other (See Comments)    seizures  . Tramadol Other (See Comments)    "causes seizures"  . Ultram [Tramadol Hcl] Other (See Comments)    Seizures  . Vimpat [Lacosamide] Other (See  Comments)    Severe dizziness  . Mirtazapine Other (See Comments)    Potentiated seizure activity  . Betadine [Povidone Iodine] Rash  . Latex Rash  . Penicillins Itching and Rash    Has patient had a PCN reaction causing immediate rash, facial/tongue/throat swelling, SOB or lightheadedness with hypotension: No Has patient had a PCN reaction causing severe rash involving mucus membranes or skin necrosis: No Has patient had a PCN reaction that required hospitalization No Has patient had a PCN reaction occurring within the last 10 years: No If all of the above answers are "NO", then may proceed with Cephalosporin use.  . Sulfa Antibiotics Rash  . Tape Rash and Other (See Comments)    Paper tape please Paper tape please  . Vicodin [Hydrocodone-Acetaminophen] Itching    Metabolic Disorder Labs: Lab Results  Component Value Date   HGBA1C 5.4 11/28/2015   MPG 108 11/28/2015   No results found for: PROLACTIN Lab Results  Component Value Date   CHOL 151 09/21/2017   TRIG 124.0 09/21/2017   HDL 53.10 09/21/2017   CHOLHDL 3 09/21/2017   VLDL 24.8 09/21/2017   LDLCALC 73 09/21/2017   LDLCALC 86 07/07/2016   Lab Results  Component Value Date   TSH 1.52 09/21/2017   TSH 1.900 07/07/2016    Therapeutic Level Labs: No results found for: LITHIUM No results found for: VALPROATE No components found for:  CBMZ  Current Medications: Current Outpatient Medications  Medication Sig Dispense Refill  . FLUoxetine (PROZAC) 40 MG capsule Take 2  capsules (80 mg total) by mouth daily. 180 capsule 0  . gabapentin (NEURONTIN) 600 MG tablet Take 1 tablet (600 mg total) by mouth 3 (three) times daily. 270 tablet 3  . [START ON 03/31/2019] hydrOXYzine (ATARAX/VISTARIL) 25 MG tablet Take 1 tablet (25 mg total) by mouth 2 (two) times daily as needed for anxiety. 180 tablet 0  . lamoTRIgine (LAMICTAL) 150 MG tablet Take 1 tablet (150 mg total) by mouth 2 (two) times daily. For mood control 60 tablet 11  . levETIRAcetam (KEPPRA) 1000 MG tablet Take 1 in the morning and 1 tablet at bedtime. 180 tablet 0  . [START ON 03/31/2019] LORazepam (ATIVAN) 1 MG tablet Take 1 tablet (1 mg total) by mouth daily as needed for anxiety. 30 tablet 0  . meclizine (ANTIVERT) 25 MG tablet Take 1 tablet (25 mg total) by mouth 3 (three) times daily as needed for dizziness. 30 tablet 2  . pramipexole (MIRAPEX) 0.5 MG tablet Take 1 tablet by mouth twice daily 60 tablet 5   No current facility-administered medications for this visit.     Musculoskeletal: Strength & Muscle Tone: N/A Gait & Station: N/A Patient leans: N/A  Psychiatric Specialty Exam: Review of Systems  Psychiatric/Behavioral: Negative for agitation, behavioral problems, confusion, decreased concentration, dysphoric mood, hallucinations, self-injury, sleep disturbance and suicidal ideas. The patient is nervous/anxious. The patient is not hyperactive.   All other systems reviewed and are negative.   There were no vitals taken for this visit.There is no height or weight on file to calculate BMI.  General Appearance: NA  Eye Contact:  NA  Speech:  Clear and Coherent  Volume:  Normal  Mood:  "good"  Affect:  NA  Thought Process:  Coherent  Orientation:  Full (Time, Place, and Person)  Thought Content: Logical   Suicidal Thoughts:  No  Homicidal Thoughts:  No  Memory:  Immediate;   Good  Judgement:  Good  Insight:  Fair  Psychomotor Activity:  Normal  Concentration:  Concentration: Good and  Attention Span: Good  Recall:  Good  Fund of Knowledge: Good  Language: Good  Akathisia:  No  Handed:  Right  AIMS (if indicated): not done  Assets:  Communication Skills Desire for Improvement  ADL's:  Intact  Cognition: WNL  Sleep:  Good   Screenings: AIMS     ED to Hosp-Admission (Discharged) from 10/30/2017 in [facility]  AIMS Total Score  0    AUDIT     ED to Hosp-Admission (Discharged) from 10/30/2017 in [facility]  Alcohol Use Disorder Identification Test Final Score (AUDIT)  0    GAD-7     Office Visit from 01/31/2015 in [facility]  Total GAD-7 Score  15    Mini-Mental     Office Visit from 02/20/2014 in [facility]  Total Score (max 30 points )  30    PHQ2-9     Patient Outreach from 08/31/2017 in [facility] Visit from 04/17/2017 in [facility] Office Visit from 04/13/2017 in [facility] Office Visit from 02/03/2017 in [facility] Visit from 01/30/2017 in [facility]  PHQ-2 Total Score  2  1  2  2   0  PHQ-9 Total Score  14  -  17  13  -       Assessment and Plan:  ESHAL HOBERMAN is a 54 y.o. year old female with a history of depression, pseudoseizure,partialsymptomatic epilepsy with complex partial seizures, osteoarthritis, congenital hip dislocation s/p replacement , who presents for follow up appointment for MDD (major depressive disorder), recurrent, in partial remission ([place]) - Plan: FLUoxetine (PROZAC) 40 MG capsule  Anxiety - Plan: FLUoxetine (PROZAC) 40 MG capsule  # MDD in partial remission, recurrent There has been more improvement in depressive symptoms and anxiety since the last visit.  Will continue fluoxetine at the current dose to target depression and anxiety.  Will continue lorazepam as needed for anxiety.  Discussed risk of  dependence and oversedation.  Will continue hydroxyzine as needed for anxiety.   Plan I have reviewed and updated plans as below 1. Continue fluoxetine 80 mg daily  2. Continue lorazepam 1 mg daily as needed for anxiety (she takes this when she has a seizure like episode) 3. Continue hydroxyzine 25 mg twice a day as needed for anxiety  4. Next appointment: 6/16 at 10:20 for 20 mins, phone -on gabapentin, Lamictal, Keppra for seizure. - on pramipexole for restless leg  Past trials of medication: fluoxetine,sertraline (increased appetite),citalopram,mirtazapine (patient self discontinued as she was afraid of )Trazodone, lorazepam, clonazepam   The patient demonstrates the following risk factors for suicide: Chronic risk factors for suicide include: psychiatric disorder of depressionand previous suicide attempts of overdosing medication. Acute risk factorsfor suicide include: unemployment and her husband with terminal illness. Protective factorsfor this patient include: positive social support, coping skills and hope for the future. Considering these factors, the overall suicide risk at this point appears to be low. Patient isappropriate for outpatient follow up.  Norman Clay, MD 03/30/2019, 9:25 AM

## 2019-03-28 ENCOUNTER — Other Ambulatory Visit (HOSPITAL_COMMUNITY): Payer: Self-pay | Admitting: Psychiatry

## 2019-03-28 ENCOUNTER — Telehealth (HOSPITAL_COMMUNITY): Payer: Self-pay | Admitting: *Deleted

## 2019-03-28 DIAGNOSIS — F419 Anxiety disorder, unspecified: Secondary | ICD-10-CM

## 2019-03-28 MED ORDER — HYDROXYZINE HCL 25 MG PO TABS: 25.0000 mg | ORAL_TABLET | Freq: Two times a day (BID) | ORAL | 0 refills | Status: DC | PRN

## 2019-03-28 MED ORDER — LORAZEPAM 1 MG PO TABS: 1.0000 mg | ORAL_TABLET | Freq: Every day | ORAL | 0 refills | Status: DC | PRN

## 2019-03-28 NOTE — Telephone Encounter (Signed)
PATIENT CALLED TO REQUEST REFILLS hydrOXYzine (ATARAX/VISTARIL) 25 MG tablet  & LORazepam (ATIVAN) 1 MG tablet. NEXT APPT SCHEDULED 03/30/19

## 2019-03-28 NOTE — Telephone Encounter (Signed)
Ordered

## 2019-03-29 ENCOUNTER — Encounter: Payer: Self-pay | Admitting: Family Medicine

## 2019-03-29 ENCOUNTER — Ambulatory Visit (INDEPENDENT_AMBULATORY_CARE_PROVIDER_SITE_OTHER): Payer: PPO | Admitting: Family Medicine

## 2019-03-29 VITALS — BP 142/82 | HR 81 | Temp 98.0°F | Ht 60.0 in | Wt 155.0 lb

## 2019-03-29 DIAGNOSIS — R590 Localized enlarged lymph nodes: Secondary | ICD-10-CM

## 2019-03-29 DIAGNOSIS — H8110 Benign paroxysmal vertigo, unspecified ear: Secondary | ICD-10-CM

## 2019-03-29 MED ORDER — MECLIZINE HCL 25 MG PO TABS: 25.0000 mg | ORAL_TABLET | Freq: Three times a day (TID) | ORAL | 2 refills | Status: DC | PRN

## 2019-03-29 NOTE — Patient Instructions (Addendum)
You are overdue for your complete physical appointment and Annual wellness visit. Please get scheduled today.    Benign Positional Vertigo Vertigo is the feeling that you or your surroundings are moving when they are not. Benign positional vertigo is the most common form of vertigo. This is usually a harmless condition (benign). This condition is positional. This means that symptoms are triggered by certain movements and positions. This condition can be dangerous if it occurs while you are doing something that could cause harm to you or others. This includes activities such as driving or operating machinery. What are the causes? In many cases, the cause of this condition is not known. It may be caused by a disturbance in an area of the inner ear that helps your brain to sense movement and balance. This disturbance can be caused by:  Viral infection (labyrinthitis).  Head injury.  Repetitive motion, such as jumping, dancing, or running. What increases the risk? You are more likely to develop this condition if:  You are a woman.  You are 54 years of age or older. What are the signs or symptoms? Symptoms of this condition usually happen when you move your head or your eyes in different directions. Symptoms may start suddenly, and usually last for less than a minute. They include:  Loss of balance and falling.  Feeling like you are spinning or moving.  Feeling like your surroundings are spinning or moving.  Nausea and vomiting.  Blurred vision.  Dizziness.  Involuntary eye movement (nystagmus). Symptoms can be mild and cause only minor problems, or they can be severe and interfere with daily life. Episodes of benign positional vertigo may return (recur) over time. Symptoms may improve over time. How is this diagnosed? This condition may be diagnosed based on:  Your medical history.  Physical exam of the head, neck, and ears.  Tests, such as: ? MRI. ? CT scan. ? Eye movement  tests. Your health care provider may ask you to change positions quickly while he or she watches you for symptoms of benign positional vertigo, such as nystagmus. Eye movement may be tested with a variety of exams that are designed to evaluate or stimulate vertigo. ? An electroencephalogram (EEG). This records electrical activity in your brain. ? Hearing tests. You may be referred to a health care provider who specializes in ear, nose, and throat (ENT) problems (otolaryngologist) or a provider who specializes in disorders of the nervous system (neurologist). How is this treated?  This condition may be treated in a session in which your health care provider moves your head in specific positions to adjust your inner ear back to normal. Treatment for this condition may take several sessions. Surgery may be needed in severe cases, but this is rare. In some cases, benign positional vertigo may resolve on its own in 2-4 weeks. Follow these instructions at home: Safety  Move slowly. Avoid sudden body or head movements or certain positions, as told by your health care provider.  Avoid driving until your health care provider says it is safe for you to do so.  Avoid operating heavy machinery until your health care provider says it is safe for you to do so.  Avoid doing any tasks that would be dangerous to you or others if vertigo occurs.  If you have trouble walking or keeping your balance, try using a cane for stability. If you feel dizzy or unstable, sit down right away.  Return to your normal activities as told by your  health care provider. Ask your health care provider what activities are safe for you. General instructions  Take over-the-counter and prescription medicines only as told by your health care provider.  Drink enough fluid to keep your urine pale yellow.  Keep all follow-up visits as told by your health care provider. This is important. Contact a health care provider if:  You  have a fever.  Your condition gets worse or you develop new symptoms.  Your family or friends notice any behavioral changes.  You have nausea or vomiting that gets worse.  You have numbness or a "pins and needles" sensation. Get help right away if you:  Have difficulty speaking or moving.  Are always dizzy.  Faint.  Develop severe headaches.  Have weakness in your legs or arms.  Have changes in your hearing or vision.  Develop a stiff neck.  Develop sensitivity to light. Summary  Vertigo is the feeling that you or your surroundings are moving when they are not. Benign positional vertigo is the most common form of vertigo.  The cause of this condition is not known. It may be caused by a disturbance in an area of the inner ear that helps your brain to sense movement and balance.  Symptoms include loss of balance and falling, feeling that you or your surroundings are moving, nausea and vomiting, and blurred vision.  This condition can be diagnosed based on symptoms, physical exam, and other tests, such as MRI, CT scan, eye movement tests, and hearing tests.  Follow safety instructions as told by your health care provider. You will also be told when to contact your health care provider in case of problems. This information is not intended to replace advice given to you by your health care provider. Make sure you discuss any questions you have with your health care provider. Document Revised: 06/10/2017 Document Reviewed: 06/10/2017 Elsevier Patient Education  [place].

## 2019-03-29 NOTE — Progress Notes (Signed)
Subjective  CC:  Chief Complaint  Patient presents with  . Adenopathy    swollen lymp nodes on right side of neck. noticed swelling a couple of days ago. started with pain    HPI: [name] is a 54 y.o. female who presents to the office today to address the problems listed above in the chief complaint.  Not feeling great: c/o 1 month of intermittent dizziness most notable when gets up out of bed in the am. Active this am. No n/v or visual changes. No headaches. Had had before. No falls. No paresis. Right neck is a little sore and felt a lump so wants to be sure that isn't the cause. No current URI sxs or scalp lesions. Has h/o R AOM recurrent. C/o depression.    Assessment  1. Benign paroxysmal positional vertigo, unspecified laterality   2. Reactive cervical lymphadenopathy      Plan   BPPV:  Recurrent. Restart prn meclizine and fall precautions and monitor. F/u if worsens or changes.   Reassured, LN is small and mobile. No worrisome findings.   Follow up: Return for complete physical.  Visit date not found  No orders of the defined types were placed in this encounter.  Meds ordered this encounter  Medications  . meclizine (ANTIVERT) 25 MG tablet    Sig: Take 1 tablet (25 mg total) by mouth 3 (three) times daily as needed for dizziness.    Dispense:  30 tablet    Refill:  2      I reviewed the patients updated PMH, FH, and SocHx.    Patient Active Problem List   Diagnosis Date Noted  . MDD (major depressive disorder), recurrent episode, mild ([place]) 01/08/2018    Priority: High  . Sleep apnea 06/09/2017    Priority: High  . Mild neurocognitive disorder 02/04/2017    Priority: High  . Primary insomnia 05/19/2016    Priority: High  . Partial symptomatic epilepsy with complex partial seizures, not intractable, with status epilepticus ([place]) 07/24/2015    Priority: High  . 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 Colon polyp 06/05/2017    Priority:  Medium  . Elevated liver function tests 06/05/2017    Priority: Medium  . Fatty liver     Priority: Medium  . OA (osteoarthritis) of knee 05/07/2015    Priority: Medium  . Anxiety 12/30/2018  . Cardiac arrest ([place]) 12/15/2018  . Widowed - July 2019 08/07/2017  . Bereavement 12/25/2015   Current Meds  Medication Sig  . FLUoxetine (PROZAC) 40 MG capsule Take 2 capsules (80 mg total) by mouth daily.  Marland Kitchen gabapentin (NEURONTIN) 600 MG tablet Take 1 tablet (600 mg total) by mouth 3 (three) times daily.  Derrill Memo ON 03/31/2019] hydrOXYzine (ATARAX/VISTARIL) 25 MG tablet Take 1 tablet (25 mg total) by mouth 2 (two) times daily as needed for anxiety.  . lamoTRIgine (LAMICTAL) 150 MG tablet Take 1 tablet (150 mg total) by mouth 2 (two) times daily. For mood control  . levETIRAcetam (KEPPRA) 1000 MG tablet Take 1 in the morning and 1 tablet at bedtime.  Derrill Memo ON 03/31/2019] LORazepam (ATIVAN) 1 MG tablet Take 1 tablet (1 mg total) by mouth daily as needed for anxiety.  . meclizine (ANTIVERT) 25 MG tablet Take 1 tablet (25 mg total) by mouth 3 (three) times daily as needed for dizziness.  . pramipexole (MIRAPEX) 0.5 MG tablet Take 1 tablet by mouth twice daily  . [DISCONTINUED] meclizine (  ANTIVERT) 25 MG tablet Take 1 tablet (25 mg total) by mouth 3 (three) times daily as needed for dizziness.    Allergies: Patient is allergic to acetaminophen; benadryl [diphenhydramine hcl]; codeine; dilantin [phenytoin sodium extended]; melatonin; tramadol; ultram [tramadol hcl]; vimpat [lacosamide]; mirtazapine; betadine [povidone iodine]; latex; penicillins; sulfa antibiotics; tape; and vicodin [hydrocodone-acetaminophen]. Family History: Patient family history includes Bipolar disorder in her son; Cancer in her brother, maternal aunt, and mother; Depression in her father; Diabetes in her brother and maternal aunt; Drug abuse in her son; Heart attack (age of onset: 50) in her sister. Social History:  Patient   reports that she has never smoked. She has never used smokeless tobacco. She reports that she does not drink alcohol or use drugs.  Review of Systems: Constitutional: Negative for fever malaise or anorexia Cardiovascular: negative for chest pain Respiratory: negative for SOB or persistent cough Gastrointestinal: negative for abdominal pain  Objective  Vitals: BP (!) 142/82 (BP Location: Left Arm, Patient Position: Sitting, Cuff Size: Normal)   Pulse 81   Temp 98 F (36.7 C) (Temporal)   Ht 5' (1.524 m)   Wt 155 lb (70.3 kg)   SpO2 94%   BMI 30.27 kg/m  General: no acute distress , A&Ox3 HEENT: PEERL, conjunctiva normal, neck is supple with 0.5 cm mobile non tender right posterior cervical lymph node. No redness, fluctuance or warmth.  Cardiovascular:  RRR without murmur or gallop.  Respiratory:  Good breath sounds bilaterally, CTAB with normal respiratory effort Skin:  Warm, no rashes Neuro: able to walk to table unassisted w/ nl gait. Gets dizzy with head mvt.      Commons side effects, risks, benefits, and alternatives for medications and treatment plan prescribed today were discussed, and the patient expressed understanding of the given instructions. Patient is instructed to call or message via MyChart if he/she has any questions or concerns regarding our treatment plan. No barriers to understanding were identified. We discussed Red Flag symptoms and signs in detail. Patient expressed understanding regarding what to do in case of urgent or emergency type symptoms.   Medication list was reconciled, printed and provided to the patient in AVS. Patient instructions and summary information was reviewed with the patient as documented in the AVS. This note was prepared with assistance of Dragon voice recognition software. Occasional wrong-word or sound-a-like substitutions may have occurred due to the inherent limitations of voice recognition software  This visit occurred during the  SARS-CoV-2 public health emergency.  Safety protocols were in place, including screening questions prior to the visit, additional usage of staff PPE, and extensive cleaning of exam room while observing appropriate contact time as indicated for disinfecting solutions.

## 2019-03-30 ENCOUNTER — Other Ambulatory Visit: Payer: Self-pay

## 2019-03-30 ENCOUNTER — Other Ambulatory Visit: Payer: Self-pay | Admitting: *Deleted

## 2019-03-30 ENCOUNTER — Encounter (HOSPITAL_COMMUNITY): Payer: Self-pay | Admitting: Psychiatry

## 2019-03-30 ENCOUNTER — Ambulatory Visit (INDEPENDENT_AMBULATORY_CARE_PROVIDER_SITE_OTHER): Payer: PPO | Admitting: Psychiatry

## 2019-03-30 DIAGNOSIS — F3341 Major depressive disorder, recurrent, in partial remission: Secondary | ICD-10-CM

## 2019-03-30 DIAGNOSIS — F419 Anxiety disorder, unspecified: Secondary | ICD-10-CM

## 2019-03-30 MED ORDER — PRAMIPEXOLE DIHYDROCHLORIDE 0.5 MG PO TABS: 0.5000 mg | ORAL_TABLET | Freq: Two times a day (BID) | ORAL | 5 refills | Status: DC

## 2019-03-30 MED ORDER — FLUOXETINE HCL 40 MG PO CAPS: 80.0000 mg | ORAL_CAPSULE | Freq: Every day | ORAL | 0 refills | Status: DC

## 2019-03-30 NOTE — Patient Instructions (Signed)
1.Continuefluoxetine 80 mg daily  2.Continue lorazepam1 mg daily as needed for anxiety  3. Continue hydroxyzine 25 mg twice a day as needed for anxiety 4. Next appointment: 6/16 at 10:20

## 2019-04-22 ENCOUNTER — Telehealth: Payer: Self-pay | Admitting: Family Medicine

## 2019-04-22 NOTE — Telephone Encounter (Signed)
Left message for patient to call back and schedule Medicare Annual Wellness Visit (AWV) either virtually/audio only OR in office. Whatever the patients preference is.  No hx; please schedule at anytime with [facility]Nurse Health Advisor at [facility].

## 2019-04-29 ENCOUNTER — Encounter: Payer: PPO | Admitting: Family Medicine

## 2019-04-29 ENCOUNTER — Ambulatory Visit: Payer: PPO

## 2019-05-27 ENCOUNTER — Other Ambulatory Visit: Payer: Self-pay | Admitting: Family Medicine

## 2019-06-14 ENCOUNTER — Telehealth: Payer: Self-pay | Admitting: Neurology

## 2019-06-14 NOTE — Telephone Encounter (Signed)
I called pt about seeing double when watching television at times and had a recent fall. Pt stated she fell once but it was not a seizure like activity  she was alert.Pt stated she wears glasses but has lost them. She will call her eye md and schedule an appt this month.I advise pt if her vision gets worse and she is falling again to seek the nearest ED.She verbalized understanding.

## 2019-06-14 NOTE — Telephone Encounter (Signed)
Pt called to advise she has been seeing double and had a fall last Tuesday and noticed once she had fall symptoms got worst. Pt is unsure what she should do

## 2019-06-17 DIAGNOSIS — H33322 Round hole, left eye: Secondary | ICD-10-CM | POA: Diagnosis not present

## 2019-06-20 DIAGNOSIS — H532 Diplopia: Secondary | ICD-10-CM | POA: Diagnosis not present

## 2019-06-21 ENCOUNTER — Telehealth: Payer: Self-pay | Admitting: Family Medicine

## 2019-06-21 NOTE — Telephone Encounter (Signed)
Patient called in and stated she went and seen her eye doctor and they wanted the patient to get a MRI of her eye. It seems like she has hole in her eye and wanted Dr. Jonni Sanger opinion on what she should do. Patient would like a call back at [phone].

## 2019-06-22 ENCOUNTER — Emergency Department (HOSPITAL_COMMUNITY): Payer: PPO

## 2019-06-22 ENCOUNTER — Encounter (HOSPITAL_COMMUNITY): Payer: Self-pay | Admitting: Emergency Medicine

## 2019-06-22 ENCOUNTER — Emergency Department (HOSPITAL_COMMUNITY)
Admission: EM | Admit: 2019-06-22 | Discharge: 2019-06-22 | Disposition: A | Discharge status: Home / Self Care | Payer: PPO | Attending: Emergency Medicine | Admitting: Emergency Medicine

## 2019-06-22 DIAGNOSIS — R569 Unspecified convulsions: Secondary | ICD-10-CM | POA: Diagnosis not present

## 2019-06-22 DIAGNOSIS — M25512 Pain in left shoulder: Secondary | ICD-10-CM | POA: Insufficient documentation

## 2019-06-22 DIAGNOSIS — W1839XA Other fall on same level, initial encounter: Secondary | ICD-10-CM | POA: Diagnosis not present

## 2019-06-22 DIAGNOSIS — S0990XA Unspecified injury of head, initial encounter: Secondary | ICD-10-CM | POA: Insufficient documentation

## 2019-06-22 DIAGNOSIS — Y999 Unspecified external cause status: Secondary | ICD-10-CM | POA: Diagnosis not present

## 2019-06-22 DIAGNOSIS — R519 Headache, unspecified: Secondary | ICD-10-CM | POA: Diagnosis not present

## 2019-06-22 DIAGNOSIS — R42 Dizziness and giddiness: Secondary | ICD-10-CM | POA: Diagnosis not present

## 2019-06-22 DIAGNOSIS — Y9389 Activity, other specified: Secondary | ICD-10-CM | POA: Insufficient documentation

## 2019-06-22 DIAGNOSIS — S4992XA Unspecified injury of left shoulder and upper arm, initial encounter: Secondary | ICD-10-CM | POA: Diagnosis not present

## 2019-06-22 DIAGNOSIS — Y929 Unspecified place or not applicable: Secondary | ICD-10-CM | POA: Diagnosis not present

## 2019-06-22 MED ORDER — MECLIZINE HCL 25 MG PO TABS
50.0000 mg | ORAL_TABLET | Freq: Once | ORAL | Status: AC
  Administered 2019-06-22: 50 mg via ORAL
  Filled 2019-06-22: qty 2

## 2019-06-22 MED ORDER — LORAZEPAM 1 MG PO TABS
1.0000 mg | ORAL_TABLET | Freq: Once | ORAL | Status: AC
  Administered 2019-06-22: 1 mg via ORAL
  Filled 2019-06-22: qty 1

## 2019-06-22 NOTE — ED Triage Notes (Signed)
Patient reports dizziness and fall last week outside, hitting head on grass. Denies taking blood thinners. Reports pain all over specifically left shoulder. Denies headache. Reports "my mother said I needed to come get checked out because she said I have been hallucinating."

## 2019-06-22 NOTE — Discharge Instructions (Addendum)
You were evaluated in the Emergency Department and after careful evaluation, we did not find any emergent condition requiring admission or further testing in the hospital.  Your exam/testing today was overall reassuring.  CT and x-ray are normal.  We encourage you to continue following up with your regular doctors and using the meclizine medication at home for dizziness.  Please return to the Emergency Department if you experience any worsening of your condition.  We encourage you to follow up with a primary care provider.  Thank you for allowing [name] to be a part of your care.

## 2019-06-22 NOTE — Telephone Encounter (Signed)
Please call her and let her know that I recommend following the eye doctor's recommendations.

## 2019-06-22 NOTE — ED Provider Notes (Signed)
[facility] Emergency Department Provider Note MRN:  [id number]  Arrival date & time: 06/22/19     Chief Complaint   Fall and Hallucinations   History of Present Illness   [name] is a 54 y.o. year-old female with a history of vertigo, nonepileptic seizures presenting to the ED with chief complaint of fall.  Patient got dizzy and fell 1 week ago.  She explains that she has a long history of vertigo and when she fell she was experiencing a room spinning sensation.  She fell and hit the back of her head.  No loss of consciousness.  Continues to feel dizzy on and off throughout the day.  Denies numbness or weakness to the arms or legs, no headache, no vision change, no chest pain, no shortness of breath, no abdominal pain.  Feels similar to her prior vertigo episodes.  Also endorsing mild to moderate left shoulder pain from the fall.  Her mother was concerned that she was hallucinating in the car earlier today.  She said "put the bowl in the sink".  She does not recall saying this.  She feels this was more likely confusion rather than hallucinations.  She denies seeing or hearing anything that is not there.  Review of Systems  A complete 10 system review of systems was obtained and all systems are negative except as noted in the HPI and PMH.   Patient's Health History    Past Medical History:  Diagnosis Date  . Arthritis    "knees" (04/22/2012)  . Chronic bronchitis ([place])    "yearly; when the weather changes" (04/22/2012)  . Colon polyp   . Colon polyps    adenomatous and hyperplastic-  . Depression   . Eczema   . Epilepsy ([place])    "been having them right often here lately" (04/22/2012)  . Fatty liver   . High cholesterol   . History of kidney stones   . Osteoarthritis    [name] 04/22/2012  . Other convulsions 05/21/12   non-epileptic spells  . Restless leg   . Seizures ([place])   . Vertigo     Past Surgical History:  Procedure Laterality Date  .  ABDOMINAL HYSTERECTOMY  2001  . BLADDER SUSPENSION    . BUNIONECTOMY Left 2000  . CESAREAN SECTION  1987; 1988  . COLONOSCOPY    . JOINT REPLACEMENT    . MASS EXCISION  10/22/2011   Procedure: EXCISION MASS;  Surgeon: [name], MD;  Location: [facility];  Service: General;  Laterality: Right;  excision right buttock mass  . TOTAL HIP ARTHROPLASTY Left 1993; 1995; 2000  . TOTAL KNEE ARTHROPLASTY Left 05/07/2015   Procedure: TOTAL LEFT KNEE ARTHROPLASTY;  Surgeon: [name], MD;  Location: [facility];  Service: Orthopedics;  Laterality: Left;  . TOTAL KNEE ARTHROPLASTY Right 10/01/2015   Procedure: RIGHT TOTAL KNEE ARTHROPLASTY;  Surgeon: [name], MD;  Location: [facility];  Service: Orthopedics;  Laterality: Right;    Family History  Problem Relation Age of Onset  . Cancer Mother   . Depression Father   . Cancer Brother   . Diabetes Brother   . Cancer Maternal Aunt   . Diabetes Maternal Aunt   . Bipolar disorder Son   . Drug abuse Son   . Heart attack Sister 79       during severe illness    Social History   Socioeconomic History  . Marital status: Widowed    Spouse name: [name]  .  Number of children: 2  . Years of education: 12 +  . Highest education level: Not on file  Occupational History  . Occupation: disability, medical  Tobacco Use  . Smoking status: Never Smoker  . Smokeless tobacco: Never Used  Substance and Sexual Activity  . Alcohol use: No    Alcohol/week: 0.0 standard drinks    Comment: 12-25-2015 per pt no  . Drug use: No    Comment: 21-12-2015 per pt no   . Sexual activity: Not Currently    Birth control/protection: Surgical  Other Topics Concern  . Not on file  Social History Narrative   Patient has two adult children.   Patient is disabled.   Patient has a high school education and some trade school.   Patient is right-handed.   Patient drinks four glasses of soda and tea daily.   Social Determinants of Health   Financial Resource Strain:    . Difficulty of Paying Living Expenses:   Food Insecurity:   . Worried About Charity fundraiser in the Last Year:   . Arboriculturist in the Last Year:   Transportation Needs:   . Film/video editor (Medical):   Marland Kitchen Lack of Transportation (Non-Medical):   Physical Activity:   . Days of Exercise per Week:   . Minutes of Exercise per Session:   Stress:   . Feeling of Stress :   Social Connections:   . Frequency of Communication with Friends and Family:   . Frequency of Social Gatherings with Friends and Family:   . Attends Religious Services:   . Active Member of Clubs or Organizations:   . Attends Archivist Meetings:   Marland Kitchen Marital Status:   Intimate Partner Violence:   . Fear of Current or Ex-Partner:   . Emotionally Abused:   Marland Kitchen Physically Abused:   . Sexually Abused:      Physical Exam   Vitals:   06/22/19 1900 06/22/19 2000  BP: 131/88 133/84  Pulse: 72 72  Resp: 16 (!) 21  Temp:    SpO2: 97% 97%    CONSTITUTIONAL: Chronically ill-appearing, NAD NEURO:  Alert and oriented x 3, normal and symmetric strength and sensation, normal coordination, normal speech EYES:  eyes equal and reactive ENT/NECK:  no LAD, no JVD CARDIO: Regular rate, well-perfused, normal S1 and S2 PULM:  CTAB no wheezing or rhonchi GI/GU:  normal bowel sounds, non-distended, non-tender MSK/SPINE:  No gross deformities, no edema; tenderness to palpation to the left clavicle, preserved range of motion of the left shoulder  SKIN:  no rash, atraumatic PSYCH:  Appropriate speech and behavior  *Additional and/or pertinent findings included in MDM below  Diagnostic and Interventional Summary    EKG Interpretation  Date/Time:    Ventricular Rate:    PR Interval:    QRS Duration:   QT Interval:    QTC Calculation:   R Axis:     Text Interpretation:        Labs Reviewed - No data to display  DG Shoulder Left  Final Result    CT Head Wo Contrast  Final Result        Medications  meclizine (ANTIVERT) tablet 50 mg (50 mg Oral Given 06/22/19 1954)  LORazepam (ATIVAN) tablet 1 mg (1 mg Oral Given 06/22/19 2001)     Procedures  /  Critical Care Procedures  ED Course and Medical Decision Making  I have reviewed the triage vital signs, the nursing notes, and  pertinent available records from the EMR.  Listed above are laboratory and imaging tests that I personally ordered, reviewed, and interpreted and then considered in my medical decision making (see below for details).      CT to exclude subdural hematoma.  Patient is a long history of vertigo, seems very consistent with a peripheral or benign vertigo.  Reassuring neurological exam.  Patient also has a history of bizarre or strange behavior and her room nonsensical talk would seem to correlate with this.  She is not actively hallucinating, she does not have SI or HI or any other acute psychiatric needs.  With negative CT head I think that patient would be appropriate for discharge with reassurance and follow-up with her regular doctor for further management of vertigo.    Barth Kirks. Sedonia Small, MD [place] [email or web address]  Final Clinical Impressions(s) / ED Diagnoses     ICD-10-CM   1. Vertigo  R42     ED Discharge Orders    None       Discharge Instructions Discussed with and Provided to Patient:     Discharge Instructions     You were evaluated in the Emergency Department and after careful evaluation, we did not find any emergent condition requiring admission or further testing in the hospital.  Your exam/testing today was overall reassuring.  CT and x-ray are normal.  We encourage you to continue following up with your regular doctors and using the meclizine medication at home for dizziness.  Please return to the Emergency Department if you experience any worsening of your condition.  We encourage you to follow up with a primary care  provider.  Thank you for allowing [name] to be a part of your care.        Maudie Flakes, MD 06/22/19 2021

## 2019-06-23 NOTE — Telephone Encounter (Signed)
Spoke with the patient and she verbalized understanding Dr. Tamela Oddi recommendations about following what her eye doctor has advised. No other questions or concerns at this time.
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
## 2019-06-29 ENCOUNTER — Other Ambulatory Visit: Payer: Self-pay

## 2019-06-29 ENCOUNTER — Encounter (HOSPITAL_COMMUNITY): Payer: PPO | Admitting: Psychiatry

## 2019-06-29 ENCOUNTER — Telehealth (HOSPITAL_COMMUNITY): Payer: Self-pay | Admitting: Psychiatry

## 2019-06-29 NOTE — Progress Notes (Signed)
This encounter was created in error - please disregard.

## 2019-06-29 NOTE — Telephone Encounter (Signed)
Although she initially agreed to do phone visit, the phone was disconnected in the middle of the interview. She does not answer her phone despite a couple of phone calls. Left voice message to contact the office.

## 2019-07-04 ENCOUNTER — Other Ambulatory Visit (HOSPITAL_COMMUNITY): Payer: Self-pay | Admitting: Psychiatry

## 2019-07-04 ENCOUNTER — Other Ambulatory Visit: Payer: Self-pay | Admitting: Family Medicine

## 2019-07-04 DIAGNOSIS — F419 Anxiety disorder, unspecified: Secondary | ICD-10-CM

## 2019-07-04 MED ORDER — LORAZEPAM 1 MG PO TABS: 1.0000 mg | ORAL_TABLET | Freq: Every day | ORAL | 0 refills | Status: DC | PRN

## 2019-07-05 ENCOUNTER — Other Ambulatory Visit: Payer: Self-pay | Admitting: Family Medicine

## 2019-07-20 DIAGNOSIS — Z471 Aftercare following joint replacement surgery: Secondary | ICD-10-CM | POA: Diagnosis not present

## 2019-07-20 DIAGNOSIS — Z96642 Presence of left artificial hip joint: Secondary | ICD-10-CM | POA: Diagnosis not present

## 2019-07-20 DIAGNOSIS — M25551 Pain in right hip: Secondary | ICD-10-CM | POA: Diagnosis not present

## 2019-07-20 DIAGNOSIS — M7061 Trochanteric bursitis, right hip: Secondary | ICD-10-CM | POA: Diagnosis not present

## 2019-07-22 ENCOUNTER — Other Ambulatory Visit: Payer: Self-pay | Admitting: Ophthalmology

## 2019-07-22 DIAGNOSIS — H532 Diplopia: Secondary | ICD-10-CM

## 2019-07-30 DIAGNOSIS — H2513 Age-related nuclear cataract, bilateral: Secondary | ICD-10-CM | POA: Diagnosis not present

## 2019-07-30 DIAGNOSIS — H40033 Anatomical narrow angle, bilateral: Secondary | ICD-10-CM | POA: Diagnosis not present

## 2019-08-01 ENCOUNTER — Telehealth (HOSPITAL_COMMUNITY): Payer: Self-pay | Admitting: Psychiatry

## 2019-08-01 DIAGNOSIS — F419 Anxiety disorder, unspecified: Secondary | ICD-10-CM

## 2019-08-01 DIAGNOSIS — F3341 Major depressive disorder, recurrent, in partial remission: Secondary | ICD-10-CM

## 2019-08-01 MED ORDER — FLUOXETINE HCL 40 MG PO CAPS: 80.0000 mg | ORAL_CAPSULE | Freq: Every day | ORAL | 0 refills | Status: DC

## 2019-08-01 NOTE — Telephone Encounter (Signed)
Appt scheduled with patient

## 2019-08-01 NOTE — Telephone Encounter (Signed)
Ordered refill for fluoxetine per request from pharmacy. Please contact the patient to make a follow up appointment. I would not be able to do anymore refills without evaluation.

## 2019-08-04 ENCOUNTER — Other Ambulatory Visit (HOSPITAL_COMMUNITY): Payer: Self-pay | Admitting: Psychiatry

## 2019-08-04 DIAGNOSIS — F419 Anxiety disorder, unspecified: Secondary | ICD-10-CM

## 2019-08-04 MED ORDER — LORAZEPAM 1 MG PO TABS: 1.0000 mg | ORAL_TABLET | Freq: Every day | ORAL | 0 refills | Status: DC | PRN

## 2019-08-04 NOTE — Telephone Encounter (Signed)
I have utilized the [place] Controlled Substances Reporting System (PMP AWARxE) to confirm adherence regarding the patient's medication. My review reveals appropriate prescription fills.  

## 2019-08-05 NOTE — Progress Notes (Signed)
Virtual Visit via Telephone Note  I connected with [name] on 08/10/19 at  1:20 PM EDT by telephone and verified that I am speaking with the correct person using two identifiers.   I discussed the limitations, risks, security and privacy concerns of performing an evaluation and management service by telephone and the availability of in person appointments. I also discussed with the patient that there may be a patient responsible charge related to this service. The patient expressed understanding and agreed to proceed.    I discussed the assessment and treatment plan with the patient. The patient was provided an opportunity to ask questions and all were answered. The patient agreed with the plan and demonstrated an understanding of the instructions.   The patient was advised to call back or seek an in-person evaluation if the symptoms worsen or if the condition fails to improve as anticipated.  Location: patient- home, provider- office   I provided 11 minutes of non-face-to-face time during this encounter.   Norman Clay, MD    [facility] MD/PA/NP OP Progress Note  08/10/2019 1:37 PM [name]  MRN:  [id number]  Chief Complaint:  Chief Complaint    Depression; Follow-up     HPI:  This is a follow-up appointment for depression.  She states that she lost her boyfriend from a car accident.  She also lost her niece's has found from infection from pressure wound at age 54.  She states that she was attached to him.  She occasionally feels sad and anxious, thinking about losses. She tends to stay in the house most of the time.  She had a good time with her family on her mother's birthday.  She takes care of her dog at home.  She has insomnia.  She has occasional anhedonia.  She has fair concentration.  She has good appetite.  She denies SI.  She feels anxious and tense at times.  She denies panic attacks. She occasionally takes Ativan a few times per night.  She discontinued hydroxyzine  due to hallucinations. She feels good that she has not had any seizure for the past two months.  Household: live by herself, and her dog   Visit Diagnosis:    ICD-10-CM   1. MDD (major depressive disorder), recurrent episode, mild ([place])  F33.0   2. Anxiety  F41.9 LORazepam (ATIVAN) 1 MG tablet    Past Psychiatric History: Please see initial evaluation for full details. I have reviewed the history. No updates at this time.     Past Medical History:  Past Medical History:  Diagnosis Date  . Arthritis    "knees" (04/22/2012)  . Chronic bronchitis ([place])    "yearly; when the weather changes" (04/22/2012)  . Colon polyp   . Colon polyps    adenomatous and hyperplastic-  . Depression   . Eczema   . Epilepsy ([place])    "been having them right often here lately" (04/22/2012)  . Fatty liver   . High cholesterol   . History of kidney stones   . Osteoarthritis    [name] 04/22/2012  . Other convulsions 05/21/12   non-epileptic spells  . Restless leg   . Seizures ([place])   . Vertigo     Past Surgical History:  Procedure Laterality Date  . ABDOMINAL HYSTERECTOMY  2001  . BLADDER SUSPENSION    . BUNIONECTOMY Left 2000  . CESAREAN SECTION  1987; 1988  . COLONOSCOPY    . JOINT REPLACEMENT    . MASS  EXCISION  10/22/2011   Procedure: EXCISION MASS;  Surgeon: [name], MD;  Location: [facility];  Service: General;  Laterality: Right;  excision right buttock mass  . TOTAL HIP ARTHROPLASTY Left 1993; 1995; 2000  . TOTAL KNEE ARTHROPLASTY Left 05/07/2015   Procedure: TOTAL LEFT KNEE ARTHROPLASTY;  Surgeon: [name], MD;  [facility];  Service: Orthopedics;  Laterality: Left;  . TOTAL KNEE ARTHROPLASTY Right 10/01/2015   Procedure: RIGHT TOTAL KNEE ARTHROPLASTY;  Surgeon: [name], MD;  [facility];  Service: Orthopedics;  Laterality: Right;    Family Psychiatric History: Please see initial evaluation for full details. I have reviewed the history. No updates at this time.      Family History:  Family History  Problem Relation Age of Onset  . Cancer Mother   . Depression Father   . Cancer Brother   . Diabetes Brother   . Cancer Maternal Aunt   . Diabetes Maternal Aunt   . Bipolar disorder Son   . Drug abuse Son   . Heart attack Sister 49       during severe illness    Social History:  Social History   Socioeconomic History  . Marital status: Widowed    Spouse name: [name]  . Number of children: 2  . Years of education: 12 +  . Highest education level: Not on file  Occupational History  . Occupation: disability, medical  Tobacco Use  . Smoking status: Never Smoker  . Smokeless tobacco: Never Used  Vaping Use  . Vaping Use: Never used  Substance and Sexual Activity  . Alcohol use: No    Alcohol/week: 0.0 standard drinks    Comment: 12-25-2015 per pt no  . Drug use: No    Comment: 21-12-2015 per pt no   . Sexual activity: Not Currently    Birth control/protection: Surgical  Other Topics Concern  . Not on file  Social History Narrative   Patient has two adult children.   Patient is disabled.   Patient has a high school education and some trade school.   Patient is right-handed.   Patient drinks four glasses of soda and tea daily.   Social Determinants of Health   Financial Resource Strain:   . Difficulty of Paying Living Expenses:   Food Insecurity:   . Worried About Charity fundraiser in the Last Year:   . Arboriculturist in the Last Year:   Transportation Needs:   . Film/video editor (Medical):   Marland Kitchen Lack of Transportation (Non-Medical):   Physical Activity:   . Days of Exercise per Week:   . Minutes of Exercise per Session:   Stress:   . Feeling of Stress :   Social Connections:   . Frequency of Communication with Friends and Family:   . Frequency of Social Gatherings with Friends and Family:   . Attends Religious Services:   . Active Member of Clubs or Organizations:   . Attends Archivist Meetings:    Marland Kitchen Marital Status:     Allergies:  Allergies  Allergen Reactions  . Acetaminophen Other (See Comments)    Has fatty deposits on liver  . Benadryl [Diphenhydramine Hcl] Other (See Comments)    hyperactivity and seizures  . Codeine Itching and Rash    seizures  . Dilantin [Phenytoin Sodium Extended] Other (See Comments)    Elevated LFT's  . Melatonin Other (See Comments)    seizures  . Tramadol Other (See  Comments)    "causes seizures"  . Ultram [Tramadol Hcl] Other (See Comments)    Seizures  . Vimpat [Lacosamide] Other (See Comments)    Severe dizziness  . Mirtazapine Other (See Comments)    Potentiated seizure activity  . Betadine [Povidone Iodine] Rash  . Latex Rash  . Penicillins Itching and Rash    Has patient had a PCN reaction causing immediate rash, facial/tongue/throat swelling, SOB or lightheadedness with hypotension: No Has patient had a PCN reaction causing severe rash involving mucus membranes or skin necrosis: No Has patient had a PCN reaction that required hospitalization No Has patient had a PCN reaction occurring within the last 10 years: No If all of the above answers are "NO", then may proceed with Cephalosporin use.  . Sulfa Antibiotics Rash  . Tape Rash and Other (See Comments)    Paper tape please Paper tape please  . Vicodin [Hydrocodone-Acetaminophen] Itching    Metabolic Disorder Labs: Lab Results  Component Value Date   HGBA1C 5.4 11/28/2015   MPG 108 11/28/2015   No results found for: PROLACTIN Lab Results  Component Value Date   CHOL 151 09/21/2017   TRIG 124.0 09/21/2017   HDL 53.10 09/21/2017   CHOLHDL 3 09/21/2017   VLDL 24.8 09/21/2017   LDLCALC 73 09/21/2017   LDLCALC 86 07/07/2016   Lab Results  Component Value Date   TSH 1.52 09/21/2017   TSH 1.900 07/07/2016    Therapeutic Level Labs: No results found for: LITHIUM No results found for: VALPROATE No components found for:  CBMZ  Current Medications: Current  Outpatient Medications  Medication Sig Dispense Refill  . FLUoxetine (PROZAC) 40 MG capsule Take 2 capsules (80 mg total) by mouth daily. 180 capsule 0  . gabapentin (NEURONTIN) 600 MG tablet TAKE 1 TABLET BY MOUTH THREE TIMES DAILY 270 tablet 0  . lamoTRIgine (LAMICTAL) 150 MG tablet Take 1 tablet (150 mg total) by mouth 2 (two) times daily. For mood control 60 tablet 11  . levETIRAcetam (KEPPRA) 1000 MG tablet Take 1 in the morning and 1 tablet at bedtime. 180 tablet 0  . [START ON 09/02/2019] LORazepam (ATIVAN) 1 MG tablet Take 1 tablet (1 mg total) by mouth daily as needed for anxiety. 30 tablet 1  . meclizine (ANTIVERT) 25 MG tablet Take 1 tablet (25 mg total) by mouth 3 (three) times daily as needed for dizziness. 30 tablet 2  . pramipexole (MIRAPEX) 0.5 MG tablet Take 1 tablet (0.5 mg total) by mouth 2 (two) times daily. 60 tablet 5   No current facility-administered medications for this visit.     Musculoskeletal: Strength & Muscle Tone: N/A Gait & Station: N/A Patient leans: N/A  Psychiatric Specialty Exam: Review of Systems  All other systems reviewed and are negative.   There were no vitals taken for this visit.There is no height or weight on file to calculate BMI.  General Appearance: NA  Eye Contact:  NA  Speech:  Clear and Coherent  Volume:  Normal  Mood:  Anxious  Affect:  NA  Thought Process:  Coherent  Orientation:  Full (Time, Place, and Person)  Thought Content: Logical   Suicidal Thoughts:  No  Homicidal Thoughts:  No  Memory:  Immediate;   Good  Judgement:  Good  Insight:  Fair  Psychomotor Activity:  Normal  Concentration:  Concentration: Good and Attention Span: Good  Recall:  Good  Fund of Knowledge: Good  Language: Good  Akathisia:  No  Handed:  Right  AIMS (if indicated): not done  Assets:  Communication Skills Desire for Improvement  ADL's:  Intact  Cognition: WNL  Sleep:  Poor   Screenings: AIMS     ED to Hosp-Admission (Discharged)  from 10/30/2017 in [facility]  AIMS Total Score 0    AUDIT     ED to Hosp-Admission (Discharged) from 10/30/2017 in [facility]  Alcohol Use Disorder Identification Test Final Score (AUDIT) 0    GAD-7     Office Visit from 01/31/2015 in [place]  Total GAD-7 Score 15    Mini-Mental     Office Visit from 02/20/2014 in [facility]  Total Score (max 30 points ) 30    PHQ2-9     Patient Outreach from 08/31/2017 in [facility] Visit from 04/17/2017 in [facility] Office Visit from 04/13/2017 in [place] Office Visit from 02/03/2017 in [facility] Visit from 01/30/2017 in [facility]  PHQ-2 Total Score 2 1 2 2  0  PHQ-9 Total Score 14 -- 17 13 --       Assessment and Plan:  JAMYLA ARD is a 54 y.o. year old female with a history of depression, pseudoseizure,partialsymptomatic epilepsy with complex partial seizures, osteoarthritis, congenital hip dislocation s/p replacement, who presents for follow up appointment for below.   1. MDD (major depressive disorder), recurrent episode, mild ([place]) 2. Anxiety Although she reports slight flareup of her depressive and anxiety symptoms since the last visit in the context of loss of a boyfriend and her niece's husband, she has been handling things relatively well.  Will continue current dose of fluoxetine as maintenance therapy for depression anxiety.  Will continue lorazepam as needed for anxiety.  Discussed risk of dependence and oversedation.  Will discontinue hydroxyzine given adverse reaction of hallucinations.   Plan I have reviewed and updated plans as below 1.Continuefluoxetine 80 mg daily  2.Continue lorazepam1 mg daily as needed for anxiety (she takes this when she has a seizure like episode) - She self  discontinued hydroxyzine 4. Next appointment: 10/6 at 11:10 for 20 mins, phone   -on gabapentin, Lamictal, Keppra for seizure. - on pramipexole for restless leg  Past trials of medication: fluoxetine,sertraline (increased appetite),citalopram,mirtazapine (patient self discontinued as she was afraid of ), hydroxyzine (hallucinations), Trazodone, lorazepam, clonazepam   The patient demonstrates the following risk factors for suicide: Chronic risk factors for suicide include: psychiatric disorder of depressionand previous suicide attempts of overdosing medication. Acute risk factorsfor suicide include: unemployment and her husband with terminal illness. Protective factorsfor this patient include: positive social support, coping skills and hope for the future. Considering these factors, the overall suicide risk at this point appears to be low. Patient isappropriate for outpatient follow up.   Norman Clay, MD 08/10/2019, 1:37 PM

## 2019-08-10 ENCOUNTER — Telehealth (INDEPENDENT_AMBULATORY_CARE_PROVIDER_SITE_OTHER): Payer: PPO | Admitting: Psychiatry

## 2019-08-10 ENCOUNTER — Encounter (HOSPITAL_COMMUNITY): Payer: Self-pay | Admitting: Psychiatry

## 2019-08-10 ENCOUNTER — Other Ambulatory Visit: Payer: Self-pay

## 2019-08-10 DIAGNOSIS — F419 Anxiety disorder, unspecified: Secondary | ICD-10-CM | POA: Diagnosis not present

## 2019-08-10 DIAGNOSIS — F330 Major depressive disorder, recurrent, mild: Secondary | ICD-10-CM | POA: Diagnosis not present

## 2019-08-10 MED ORDER — LORAZEPAM 1 MG PO TABS: 1.0000 mg | ORAL_TABLET | Freq: Every day | ORAL | 1 refills | Status: DC | PRN

## 2019-08-10 NOTE — Patient Instructions (Signed)
1.Continuefluoxetine 80 mg daily  2.Continue lorazepam1 mg daily as needed for anxiety  3. Next appointment: 10/6 at 11:10

## 2019-08-20 ENCOUNTER — Other Ambulatory Visit: Payer: Self-pay

## 2019-08-20 ENCOUNTER — Ambulatory Visit
Admission: RE | Admit: 2019-08-20 | Discharge: 2019-08-20 | Disposition: A | Discharge status: Home / Self Care | Payer: PPO | Source: Ambulatory Visit | Attending: Ophthalmology | Admitting: Ophthalmology

## 2019-08-20 DIAGNOSIS — H532 Diplopia: Secondary | ICD-10-CM

## 2019-08-20 DIAGNOSIS — R9082 White matter disease, unspecified: Secondary | ICD-10-CM | POA: Diagnosis not present

## 2019-08-20 DIAGNOSIS — J320 Chronic maxillary sinusitis: Secondary | ICD-10-CM | POA: Diagnosis not present

## 2019-08-20 DIAGNOSIS — J3489 Other specified disorders of nose and nasal sinuses: Secondary | ICD-10-CM | POA: Diagnosis not present

## 2019-08-20 DIAGNOSIS — G9389 Other specified disorders of brain: Secondary | ICD-10-CM | POA: Diagnosis not present

## 2019-08-20 MED ORDER — GADOBENATE DIMEGLUMINE 529 MG/ML IV SOLN
13.0000 mL | Freq: Once | INTRAVENOUS | Status: AC | PRN
  Administered 2019-08-20: 13 mL via INTRAVENOUS

## 2019-08-25 ENCOUNTER — Other Ambulatory Visit: Payer: Self-pay | Admitting: Radiation Therapy

## 2019-08-25 DIAGNOSIS — D496 Neoplasm of unspecified behavior of brain: Secondary | ICD-10-CM | POA: Diagnosis not present

## 2019-08-29 ENCOUNTER — Other Ambulatory Visit: Payer: Self-pay | Admitting: Radiation Therapy

## 2019-08-29 DIAGNOSIS — D329 Benign neoplasm of meninges, unspecified: Secondary | ICD-10-CM

## 2019-08-30 ENCOUNTER — Encounter: Payer: Self-pay | Admitting: Radiation Therapy

## 2019-08-30 NOTE — Progress Notes (Signed)
I was unable to reach the patient or her mother on the phone in regards to her referral from Dr. Colleen Can office. I have her scheduled to meet with Dr. [name] to discuss fractionated SRS to treat her meningioma. A detailed list of all her upcoming appointments and my contact information was mailed out on 8/25.   Mont Dutton R.T.(R)(T) Radiation Special Procedures Navigator

## 2019-08-31 ENCOUNTER — Telehealth (HOSPITAL_COMMUNITY): Payer: Self-pay | Admitting: *Deleted

## 2019-08-31 NOTE — Telephone Encounter (Signed)
Patient called stating that she just found out that she have a brain tumor and she is stressed and her Ativan is not working and she needs to know what to do. [phone].

## 2019-08-31 NOTE — Progress Notes (Unsigned)
[facility] MD/PA/NP OP Progress Note  08/31/2019 4:40 PM [name]  MRN:  [id number]  Chief Complaint:  HPI: *** Visit Diagnosis: No diagnosis found.  Past Psychiatric History: Please see initial evaluation for full details. I have reviewed the history. No updates at this time.     Past Medical History:  Past Medical History:  Diagnosis Date  . Arthritis    "knees" (04/22/2012)  . Chronic bronchitis ([place])    "yearly; when the weather changes" (04/22/2012)  . Colon polyp   . Colon polyps    adenomatous and hyperplastic-  . Depression   . Eczema   . Epilepsy ([place])    "been having them right often here lately" (04/22/2012)  . Fatty liver   . High cholesterol   . History of kidney stones   . Osteoarthritis    [name] 04/22/2012  . Other convulsions 05/21/12   non-epileptic spells  . Restless leg   . Seizures ([place])   . Vertigo     Past Surgical History:  Procedure Laterality Date  . ABDOMINAL HYSTERECTOMY  2001  . BLADDER SUSPENSION    . BUNIONECTOMY Left 2000  . CESAREAN SECTION  1987; 1988  . COLONOSCOPY    . JOINT REPLACEMENT    . MASS EXCISION  10/22/2011   Procedure: EXCISION MASS;  Surgeon: [name], MD;  Location: [facility];  Service: General;  Laterality: Right;  excision right buttock mass  . TOTAL HIP ARTHROPLASTY Left 1993; 1995; 2000  . TOTAL KNEE ARTHROPLASTY Left 05/07/2015   Procedure: TOTAL LEFT KNEE ARTHROPLASTY;  Surgeon: [name], MD;  [facility];  Service: Orthopedics;  Laterality: Left;  . TOTAL KNEE ARTHROPLASTY Right 10/01/2015   Procedure: RIGHT TOTAL KNEE ARTHROPLASTY;  Surgeon: [name], MD;  [facility];  Service: Orthopedics;  Laterality: Right;    Family Psychiatric History: Please see initial evaluation for full details. I have reviewed the history. No updates at this time.     Family History:  Family History  Problem Relation Age of Onset  . Cancer Mother   . Depression Father   . Cancer Brother   . Diabetes  Brother   . Cancer Maternal Aunt   . Diabetes Maternal Aunt   . Bipolar disorder Son   . Drug abuse Son   . Heart attack Sister 68       during severe illness    Social History:  Social History   Socioeconomic History  . Marital status: Widowed    Spouse name: [name]  . Number of children: 2  . Years of education: 12 +  . Highest education level: Not on file  Occupational History  . Occupation: disability, medical  Tobacco Use  . Smoking status: Never Smoker  . Smokeless tobacco: Never Used  Vaping Use  . Vaping Use: Never used  Substance and Sexual Activity  . Alcohol use: No    Alcohol/week: 0.0 standard drinks    Comment: 12-25-2015 per pt no  . Drug use: No    Comment: 21-12-2015 per pt no   . Sexual activity: Not Currently    Birth control/protection: Surgical  Other Topics Concern  . Not on file  Social History Narrative   Patient has two adult children.   Patient is disabled.   Patient has a high school education and some trade school.   Patient is right-handed.   Patient drinks four glasses of soda and tea daily.   Social Determinants of Health  Financial Resource Strain:   . Difficulty of Paying Living Expenses:   Food Insecurity:   . Worried About Charity fundraiser in the Last Year:   . Arboriculturist in the Last Year:   Transportation Needs:   . Film/video editor (Medical):   Marland Kitchen Lack of Transportation (Non-Medical):   Physical Activity:   . Days of Exercise per Week:   . Minutes of Exercise per Session:   Stress:   . Feeling of Stress :   Social Connections:   . Frequency of Communication with Friends and Family:   . Frequency of Social Gatherings with Friends and Family:   . Attends Religious Services:   . Active Member of Clubs or Organizations:   . Attends Archivist Meetings:   Marland Kitchen Marital Status:     Allergies:  Allergies  Allergen Reactions  . Acetaminophen Other (See Comments)    Has fatty deposits on liver  .  Benadryl [Diphenhydramine Hcl] Other (See Comments)    hyperactivity and seizures  . Codeine Itching and Rash    seizures  . Dilantin [Phenytoin Sodium Extended] Other (See Comments)    Elevated LFT's  . Melatonin Other (See Comments)    seizures  . Tramadol Other (See Comments)    "causes seizures"  . Ultram [Tramadol Hcl] Other (See Comments)    Seizures  . Vimpat [Lacosamide] Other (See Comments)    Severe dizziness  . Mirtazapine Other (See Comments)    Potentiated seizure activity  . Betadine [Povidone Iodine] Rash  . Latex Rash  . Penicillins Itching and Rash    Has patient had a PCN reaction causing immediate rash, facial/tongue/throat swelling, SOB or lightheadedness with hypotension: No Has patient had a PCN reaction causing severe rash involving mucus membranes or skin necrosis: No Has patient had a PCN reaction that required hospitalization No Has patient had a PCN reaction occurring within the last 10 years: No If all of the above answers are "NO", then may proceed with Cephalosporin use.  . Sulfa Antibiotics Rash  . Tape Rash and Other (See Comments)    Paper tape please Paper tape please  . Vicodin [Hydrocodone-Acetaminophen] Itching    Metabolic Disorder Labs: Lab Results  Component Value Date   HGBA1C 5.4 11/28/2015   MPG 108 11/28/2015   No results found for: PROLACTIN Lab Results  Component Value Date   CHOL 151 09/21/2017   TRIG 124.0 09/21/2017   HDL 53.10 09/21/2017   CHOLHDL 3 09/21/2017   VLDL 24.8 09/21/2017   LDLCALC 73 09/21/2017   LDLCALC 86 07/07/2016   Lab Results  Component Value Date   TSH 1.52 09/21/2017   TSH 1.900 07/07/2016    Therapeutic Level Labs: No results found for: LITHIUM No results found for: VALPROATE No components found for:  CBMZ  Current Medications: Current Outpatient Medications  Medication Sig Dispense Refill  . FLUoxetine (PROZAC) 40 MG capsule Take 2 capsules (80 mg total) by mouth daily. 180 capsule 0   . gabapentin (NEURONTIN) 600 MG tablet TAKE 1 TABLET BY MOUTH THREE TIMES DAILY 270 tablet 0  . lamoTRIgine (LAMICTAL) 150 MG tablet Take 1 tablet (150 mg total) by mouth 2 (two) times daily. For mood control 60 tablet 11  . levETIRAcetam (KEPPRA) 1000 MG tablet Take 1 in the morning and 1 tablet at bedtime. 180 tablet 0  . [START ON 09/02/2019] LORazepam (ATIVAN) 1 MG tablet Take 1 tablet (1 mg total) by mouth daily as  needed for anxiety. 30 tablet 1  . meclizine (ANTIVERT) 25 MG tablet Take 1 tablet (25 mg total) by mouth 3 (three) times daily as needed for dizziness. 30 tablet 2  . pramipexole (MIRAPEX) 0.5 MG tablet Take 1 tablet (0.5 mg total) by mouth 2 (two) times daily. 60 tablet 5   No current facility-administered medications for this visit.     Musculoskeletal: Strength & Muscle Tone: N/A Gait & Station: N/A Patient leans: N/A  Psychiatric Specialty Exam: Review of Systems  There were no vitals taken for this visit.There is no height or weight on file to calculate BMI.  General Appearance: {Appearance:22683}  Eye Contact:  {BHH EYE CONTACT:22684}  Speech:  Clear and Coherent  Volume:  Normal  Mood:  {BHH MOOD:22306}  Affect:  {Affect (PAA):22687}  Thought Process:  Coherent  Orientation:  Full (Time, Place, and Person)  Thought Content: Logical   Suicidal Thoughts:  {ST/HT (PAA):22692}  Homicidal Thoughts:  {ST/HT (PAA):22692}  Memory:  Immediate;   Good  Judgement:  {Judgement (PAA):22694}  Insight:  {Insight (PAA):22695}  Psychomotor Activity:  Normal  Concentration:  Concentration: Good and Attention Span: Good  Recall:  Good  Fund of Knowledge: Good  Language: Good  Akathisia:  No  Handed:  Right  AIMS (if indicated): not done  Assets:  Communication Skills Desire for Improvement  ADL's:  Intact  Cognition: WNL  Sleep:  {BHH GOOD/FAIR/POOR:22877}   Screenings: AIMS     ED to Hosp-Admission (Discharged) from 10/30/2017 in [facility]  AIMS Total Score 0    AUDIT     ED to Hosp-Admission (Discharged) from 10/30/2017 in [facility]  Alcohol Use Disorder Identification Test Final Score (AUDIT) 0    GAD-7     Office Visit from 01/31/2015 in [facility]  Total GAD-7 Score 15    Mini-Mental     Office Visit from 02/20/2014 in [facility]  Total Score (max 30 points ) 30    PHQ2-9     Patient Outreach from 08/31/2017 in [facility] Office Visit from 04/17/2017 in [facility] Office Visit from 04/13/2017 in [facility] Office Visit from 02/03/2017 in [facility] Office Visit from 01/30/2017 in [facility]  PHQ-2 Total Score 2 1 2 2  0  PHQ-9 Total Score 14 -- 17 13 --       Assessment and Plan:  [name] is a 54 y.o. year old female with a history of  depression, pseudoseizure,partialsymptomatic epilepsy with complex partial seizures, osteoarthritis, congenital hip dislocation s/p replacement, who presents for follow up appointment for below.     1. MDD (major depressive disorder), recurrent episode, mild ([place]) 2. Anxiety Although she reports slight flareup of her depressive and anxiety symptoms since the last visit in the context of loss of a boyfriend and her niece's husband, she has been handling things relatively well.  Will continue current dose of fluoxetine as maintenance therapy for depression anxiety.  Will continue lorazepam as needed for anxiety.  Discussed risk of dependence and oversedation.  Will discontinue hydroxyzine given adverse reaction of hallucinations.   Plan  1.Continuefluoxetine 80 mg daily  2.Continue lorazepam1 mg daily as needed for anxiety(she takes this when she has a seizure like episode) - She self discontinued hydroxyzine 4. Next appointment: 10/6 at 11:10 for 20 mins, phone    -on gabapentin, Lamictal, Keppra for seizure. -  on pramipexole for restless leg  Past trials of medication: fluoxetine,sertraline (increased appetite),citalopram,mirtazapine (patient self discontinued as she was afraid of ), hydroxyzine (hallucinations), Trazodone, lorazepam, clonazepam   The patient demonstrates the following risk factors for suicide: Chronic risk factors for suicide include: psychiatric disorder of depressionand previous suicide attempts of overdosing medication. Acute risk factorsfor suicide include: unemployment and her husband with terminal illness. Protective factorsfor this patient include: positive social support, coping skills and hope for the future. Considering these factors, the overall suicide risk at this point appears to be low. Patient isappropriate for outpatient follow up.  Norman Clay, MD 08/31/2019, 4:40 PM

## 2019-08-31 NOTE — Telephone Encounter (Signed)
Please advise her to reschedule for sooner appointment.

## 2019-08-31 NOTE — Telephone Encounter (Signed)
Pt sch appt

## 2019-09-06 ENCOUNTER — Other Ambulatory Visit: Payer: Self-pay

## 2019-09-06 ENCOUNTER — Telehealth (HOSPITAL_COMMUNITY): Payer: Self-pay | Admitting: Psychiatry

## 2019-09-06 ENCOUNTER — Telehealth (HOSPITAL_COMMUNITY): Payer: PPO | Admitting: Psychiatry

## 2019-09-06 NOTE — Telephone Encounter (Signed)
Called the patient for appointment scheduled today. The patient did not answer the phone. No option to leave a voice message.

## 2019-09-07 ENCOUNTER — Ambulatory Visit
Admission: RE | Admit: 2019-09-07 | Discharge: 2019-09-07 | Disposition: A | Discharge status: Home / Self Care | Payer: PPO | Source: Ambulatory Visit | Attending: Radiation Oncology | Admitting: Radiation Oncology

## 2019-09-07 ENCOUNTER — Encounter: Payer: Self-pay | Admitting: Radiation Oncology

## 2019-09-07 ENCOUNTER — Other Ambulatory Visit: Payer: Self-pay

## 2019-09-07 VITALS — BP 113/78 | HR 68 | Temp 98.1°F | Resp 18 | Ht 61.0 in | Wt 149.0 lb

## 2019-09-07 DIAGNOSIS — D329 Benign neoplasm of meninges, unspecified: Secondary | ICD-10-CM

## 2019-09-07 DIAGNOSIS — G40919 Epilepsy, unspecified, intractable, without status epilepticus: Secondary | ICD-10-CM | POA: Diagnosis not present

## 2019-09-07 DIAGNOSIS — D320 Benign neoplasm of cerebral meninges: Secondary | ICD-10-CM | POA: Diagnosis not present

## 2019-09-07 LAB — BUN & CREATININE (CHCC)
BUN: 13 mg/dL (ref 6–20)
Creatinine: 0.66 mg/dL (ref 0.44–1.00)
GFR, Est AFR Am: >60 mL/min (ref >60)
GFR, Estimated: >60 mL/min (ref >60)

## 2019-09-07 NOTE — Progress Notes (Signed)
Location/Histology of Brain Tumor:  LEFT petroclival mass (Meningioma)  Patient presented with symptoms of:     MRI Brain/Orbits  08/20/2019 IMPRESSION: 1. Enhancing mass left cavernous sinus 23 x 11 mm consistent with meningioma. There is extension posteriorly along the 10. There is tumor extension into the left pontine cistern with mild mass-effect on the pons. 2. Negative orbit  Past or anticipated interventions, if any, per neurosurgery:  Under care of Dr. Emelda Brothers 08/25/2019  Past or anticipated interventions, if any, per medical oncology:  No referral made  Dose of Decadron, if applicable: Not prescribed  Recent neurologic symptoms, if any:   Seizures: Yes, patient states it's been ~3 months since last occurance  Headaches: Yes--impacts her ability to focus. She states it occurs mainly above her left eye. She is not able to take any OTC analgesics because of her liver condition  Nausea: None  Dizziness/ataxia: Yes--mainly when she goes from sitting to standing. She has to hold onto to something (wall or table) to safely move around  Difficulty with hand coordination: Yes--reports he arms and hands shake often  Focal numbness/weakness: Right before a seizure she states she has weakness in her limbs, but otherwise it's not an issue  Visual deficits/changes: Yes--reports blurry vision, double vision, and trouble focusing  Confusion/Memory deficits: Yes--reports difficulty remembering day to day things, and has difficulty finding her words at times   SAFETY ISSUES:  Prior radiation? No  Pacemaker/ICD? No  Possible current pregnancy? No--hysterectomy 2001  Is the patient on methotrexate? No  Additional Complaints / other details: Nothing of note

## 2019-09-09 ENCOUNTER — Encounter: Payer: Self-pay | Admitting: Radiation Oncology

## 2019-09-09 DIAGNOSIS — D320 Benign neoplasm of cerebral meninges: Secondary | ICD-10-CM | POA: Insufficient documentation

## 2019-09-09 NOTE — Progress Notes (Signed)
Radiation Oncology         [phone]) [phone] ________________________________  Initial outpatient Consultation  Name: [name] MRN: [id number]  Date: 09/07/2019  DOB: July 25, 1965  CC:[name], MD  Judith Part, MD   REFERRING PHYSICIAN: Judith Part, MD  DIAGNOSIS:    ICD-10-CM   1. Benign neoplasm of cerebral meninges (HCC)  D32.0     CHIEF COMPLAINT: Here to discuss management of meningioma  HISTORY OF PRESENT ILLNESS::[name] is a 54 y.o. female who presented with a history of epilepsy that is longstanding, for decades.  However, she developed new onset diplopia and headaches above /behind her left eye and was seen by her ophthalmologist for this.  Her ophthalmologist ordered an MRI of the brain and orbits on August 20, 2019 which showed a 23 x 11 mm left cavernous sinus mass consistent with meningioma.  It extends to the anterior tentorium, left pontine cistern, and has minimal mass-effect on the pons.  It is not contacting optic nerve or chiasm.  Orbital MRI negative.  She saw Dr. Zada Finders of neurosurgery who felt that surgery would be quite complicated.  He recommended consideration of radiation or radiosurgery.  He also discussed observation but this was not advised due to the progression of her symptoms and the location of her tumor. She was discussed at tumor board with consensus that fractionated radiosurgery was in her best interest.   Recent neurologic symptoms, if any:   Seizures: Yes, patient states it's been ~3 months since last occurance  Headaches: Yes--impacts her ability to focus. She states it occurs mainly above her left eye. She is not able to take any OTC analgesics because of her liver condition  Nausea: None  Dizziness/ataxia: Yes--mainly when she goes from sitting to standing. She has to hold onto to something (wall or table) to safely move around  Difficulty with hand coordination: Yes--reports he arms and hands shake  often  Focal numbness/weakness: Right before a seizure she states she has weakness in her limbs, but otherwise it's not an issue  Visual deficits/changes: Yes--reports blurry vision, double vision, and trouble focusing  Confusion/Memory deficits: Yes--reports difficulty remembering day to day things, and has difficulty finding her words at times   SAFETY ISSUES:  Prior radiation? No  Pacemaker/ICD? No  Possible current pregnancy? No--hysterectomy 2001  Is the patient on methotrexate? No  Additional Complaints / other details: Nothing of note  .  PREVIOUS RADIATION THERAPY: No  PAST MEDICAL HISTORY:  has a past medical history of Arthritis, Chronic bronchitis ([place]), Colon polyp, Colon polyps, Depression, Eczema, Epilepsy ([place]), Fatty liver, High cholesterol, History of kidney stones, Osteoarthritis, Other convulsions (05/21/12), Restless leg, Seizures ([place]), and Vertigo.    PAST SURGICAL HISTORY: Past Surgical History:  Procedure Laterality Date  . ABDOMINAL HYSTERECTOMY  2001  . BLADDER SUSPENSION    . BUNIONECTOMY Left 2000  . CESAREAN SECTION  1987; 1988  . COLONOSCOPY    . JOINT REPLACEMENT    . MASS EXCISION  10/22/2011   Procedure: EXCISION MASS;  Surgeon: [name], MD;  Location: [facility];  Service: General;  Laterality: Right;  excision right buttock mass  . TOTAL HIP ARTHROPLASTY Left 1993; 1995; 2000  . TOTAL KNEE ARTHROPLASTY Left 05/07/2015   Procedure: TOTAL LEFT KNEE ARTHROPLASTY;  Surgeon: [name], MD;  Location: [facility];  Service: Orthopedics;  Laterality: Left;  . TOTAL KNEE ARTHROPLASTY Right 10/01/2015   Procedure: RIGHT TOTAL KNEE ARTHROPLASTY;  Surgeon: Pilar Plate  Aluisio, MD;  Location: [facility];  Service: Orthopedics;  Laterality: Right;    FAMILY HISTORY: family history includes Bipolar disorder in her son; Cancer in her brother, maternal aunt, and mother; Depression in her father; Diabetes in her brother and maternal aunt; Drug abuse in her son;  Heart attack (age of onset: 20) in her sister.  SOCIAL HISTORY:  reports that she has never smoked. She has never used smokeless tobacco. She reports that she does not drink alcohol and does not use drugs.  ALLERGIES: Acetaminophen, Benadryl [diphenhydramine hcl], Codeine, Dilantin [phenytoin sodium extended], Melatonin, Tramadol, Ultram [tramadol hcl], Vimpat [lacosamide], Mirtazapine, Betadine [povidone iodine], Latex, Penicillins, Sulfa antibiotics, Tape, and Vicodin [hydrocodone-acetaminophen]  MEDICATIONS:  Current Outpatient Medications  Medication Sig Dispense Refill  . FLUoxetine (PROZAC) 40 MG capsule Take 2 capsules (80 mg total) by mouth daily. 180 capsule 0  . gabapentin (NEURONTIN) 600 MG tablet TAKE 1 TABLET BY MOUTH THREE TIMES DAILY 270 tablet 0  . lamoTRIgine (LAMICTAL) 150 MG tablet Take 1 tablet (150 mg total) by mouth 2 (two) times daily. For mood control 60 tablet 11  . levETIRAcetam (KEPPRA) 1000 MG tablet Take 1 in the morning and 1 tablet at bedtime. 180 tablet 0  . LORazepam (ATIVAN) 1 MG tablet Take 1 tablet (1 mg total) by mouth daily as needed for anxiety. 30 tablet 1  . meclizine (ANTIVERT) 25 MG tablet Take 1 tablet (25 mg total) by mouth 3 (three) times daily as needed for dizziness. 30 tablet 2  . pramipexole (MIRAPEX) 0.5 MG tablet Take 1 tablet (0.5 mg total) by mouth 2 (two) times daily. 60 tablet 5   No current facility-administered medications for this encounter.    REVIEW OF SYSTEMS:  Notable for that above.   PHYSICAL EXAM:  height is 5\' 1"  (1.549 m) and weight is 149 lb (67.6 kg). Her temperature is 98.1 F (36.7 C). Her blood pressure is 113/78 and her pulse is 68. Her respiration is 18 and oxygen saturation is 99%.   General: Alert and oriented, in no acute distress HEENT: Head is normocephalic. Extraocular movements are intact.  Neurologic: Her peripheral vision is decreased bilaterally, in both eyes.  No proptosis. Cranial nerves II through XII  otherwise are grossly intact.  Speech is fluent. Psychiatric: Judgment and insight are intact. Affect is appropriate.  KPS = 80  100 - Normal; no complaints; no evidence of disease. 90   - Able to carry on normal activity; minor signs or symptoms of disease. 80   - Normal activity with effort; some signs or symptoms of disease. 24   - Cares for self; unable to carry on normal activity or to do active work. 60   - Requires occasional assistance, but is able to care for most of his personal needs. 50   - Requires considerable assistance and frequent medical care. 30   - Disabled; requires special care and assistance. 39   - Severely disabled; hospital admission is indicated although death not imminent. 59   - Very sick; hospital admission necessary; active supportive treatment necessary. 10   - Moribund; fatal processes progressing rapidly. 0     - Dead  Karnofsky DA, Abelmann WH, Craver LS and Burchenal [facility] [phone]) The use of the nitrogen mustards in the palliative treatment of carcinoma: with particular reference to bronchogenic carcinoma Cancer 1 634-56     LABORATORY DATA:  Lab Results  Component Value Date   WBC 16.2 (H) 12/21/2018   HGB 12.6  12/21/2018   HCT 38.2 12/21/2018   MCV 92.3 12/21/2018   PLT 285 12/21/2018   CMP     Component Value Date/Time   NA 134 (L) 12/21/2018 1535   NA 144 08/23/2018 1529   K 3.9 12/21/2018 1535   CL 97 (L) 12/21/2018 1535   CO2 25 12/21/2018 1535   GLUCOSE 101 (H) 12/21/2018 1535   BUN 13 09/07/2019 1516   BUN 13 08/23/2018 1529   CREATININE 0.66 09/07/2019 1516   CALCIUM 9.4 12/21/2018 1535   PROT 6.6 08/23/2018 1529   ALBUMIN 4.5 08/23/2018 1529   AST 182 (H) 08/23/2018 1529   ALT 238 (H) 08/23/2018 1529   ALKPHOS 74 08/23/2018 1529   BILITOT 0.3 08/23/2018 1529   GFRNONAA >60 09/07/2019 1516   GFRAA >60 09/07/2019 1516         RADIOGRAPHY: MR BRAIN W WO CONTRAST  Result Date: 08/20/2019 CLINICAL DATA:  Double vision.   Dizziness. EXAM: MRI HEAD AND ORBITS WITHOUT AND WITH CONTRAST TECHNIQUE: Multiplanar, multiecho pulse sequences of the brain and surrounding structures were obtained without and with intravenous contrast. Multiplanar, multiecho pulse sequences of the orbits and surrounding structures were obtained including fat saturation techniques, before and after intravenous contrast administration. CONTRAST:  40mL MULTIHANCE GADOBENATE DIMEGLUMINE 529 MG/ML IV SOLN COMPARISON:  CT head 06/22/2019 FINDINGS: MRI HEAD FINDINGS Brain: Ventricle size and cerebral volume normal for age. Negative for acute infarct. Few tiny white matter hyperintensities bilaterally. Negative for hemorrhage Enhancing mass lesion in the left cavernous sinus measuring approximately 23 x 11 mm. This extends to the anterior tentorium which is thickened. There is also enhancing tumor extending into the left pontine cistern. This enhancing mass measures approximately 4 x 6 mm with minimal mass-effect on the pons. Vascular: Normal arterial flow voids Skull and upper cervical spine: No focal skeletal lesion Other: None MRI ORBITS FINDINGS Orbits:  Motion degraded study. The globe is symmetric in size and shape bilaterally without mass lesion. No orbital mass. Extraocular muscles normal. Optic nerve normal in signal and without enhancement. Optic canal normal. Optic chiasm normal. Pituitary not enlarged. Visualized sinuses: Mild mucosal edema in the maxillary sinus bilaterally. No air-fluid level. Soft tissues: Soft tissues of the face negative Limited intracranial: Enhancing mass in the left cavernous sinus compatible with meningioma as described above. This does not extend into the orbit. IMPRESSION: 1. Enhancing mass left cavernous sinus 23 x 11 mm consistent with meningioma. There is extension posteriorly along the 10. There is tumor extension into the left pontine cistern with mild mass-effect on the pons. 2. Negative orbit Electronically Signed   By:  Franchot Gallo M.D.   On: 08/20/2019 20:11   MR ORBITS W WO CONTRAST  Result Date: 08/20/2019 CLINICAL DATA:  Double vision.  Dizziness. EXAM: MRI HEAD AND ORBITS WITHOUT AND WITH CONTRAST TECHNIQUE: Multiplanar, multiecho pulse sequences of the brain and surrounding structures were obtained without and with intravenous contrast. Multiplanar, multiecho pulse sequences of the orbits and surrounding structures were obtained including fat saturation techniques, before and after intravenous contrast administration. CONTRAST:  7mL MULTIHANCE GADOBENATE DIMEGLUMINE 529 MG/ML IV SOLN COMPARISON:  CT head 06/22/2019 FINDINGS: MRI HEAD FINDINGS Brain: Ventricle size and cerebral volume normal for age. Negative for acute infarct. Few tiny white matter hyperintensities bilaterally. Negative for hemorrhage Enhancing mass lesion in the left cavernous sinus measuring approximately 23 x 11 mm. This extends to the anterior tentorium which is thickened. There is also enhancing tumor extending into the left  pontine cistern. This enhancing mass measures approximately 4 x 6 mm with minimal mass-effect on the pons. Vascular: Normal arterial flow voids Skull and upper cervical spine: No focal skeletal lesion Other: None MRI ORBITS FINDINGS Orbits:  Motion degraded study. The globe is symmetric in size and shape bilaterally without mass lesion. No orbital mass. Extraocular muscles normal. Optic nerve normal in signal and without enhancement. Optic canal normal. Optic chiasm normal. Pituitary not enlarged. Visualized sinuses: Mild mucosal edema in the maxillary sinus bilaterally. No air-fluid level. Soft tissues: Soft tissues of the face negative Limited intracranial: Enhancing mass in the left cavernous sinus compatible with meningioma as described above. This does not extend into the orbit. IMPRESSION: 1. Enhancing mass left cavernous sinus 23 x 11 mm consistent with meningioma. There is extension posteriorly along the 10. There is  tumor extension into the left pontine cistern with mild mass-effect on the pons. 2. Negative orbit Electronically Signed   By: Franchot Gallo M.D.   On: 08/20/2019 20:11      IMPRESSION/PLAN: This is a wonderful 54 year old woman with a left cavernous sinus meningioma  Today, I talked to the patient about the findings and work-up thus far. We discussed the patient's diagnosis of meningioma and general treatment for this, highlighting the role of radiotherapy in the management. We discussed the available radiation techniques, and focused on the details of logistics and delivery.   I recommend fractionated radiosurgery.  We discussed the risks, benefits, and side effects of radiotherapy. Side effects may include but not necessarily be limited to: Fatigue, headache, temporary hair loss, injury to the brain or nerves, rare vision loss;  no guarantees of treatment were given. A consent form was signed and placed in the patient's medical record. The patient was encouraged to ask questions that I answered to the best of my ability.   She will undergo a 3 Tesla MRI for treatment planning and be seen in my clinic soon for mask fabrication and CT simulation.  She is pleased with this plan  On date of service, in total, I spent 50 minutes on this encounter.  The patient was seen face-to-face.   __________________________________________   Eppie Gibson, MD

## 2019-09-12 ENCOUNTER — Encounter: Payer: Self-pay | Admitting: Family Medicine

## 2019-09-12 ENCOUNTER — Other Ambulatory Visit (HOSPITAL_COMMUNITY)
Admission: RE | Admit: 2019-09-12 | Discharge: 2019-09-12 | Disposition: A | Discharge status: Home / Self Care | Payer: PPO | Source: Ambulatory Visit | Attending: Family Medicine | Admitting: Family Medicine

## 2019-09-12 ENCOUNTER — Ambulatory Visit (INDEPENDENT_AMBULATORY_CARE_PROVIDER_SITE_OTHER): Payer: PPO | Admitting: Family Medicine

## 2019-09-12 ENCOUNTER — Other Ambulatory Visit: Payer: Self-pay

## 2019-09-12 VITALS — BP 104/68 | HR 93 | Temp 97.7°F | Resp 18 | Ht 60.0 in | Wt 146.6 lb

## 2019-09-12 DIAGNOSIS — F330 Major depressive disorder, recurrent, mild: Secondary | ICD-10-CM | POA: Diagnosis not present

## 2019-09-12 DIAGNOSIS — N898 Other specified noninflammatory disorders of vagina: Secondary | ICD-10-CM

## 2019-09-12 DIAGNOSIS — R7989 Other specified abnormal findings of blood chemistry: Secondary | ICD-10-CM

## 2019-09-12 DIAGNOSIS — D329 Benign neoplasm of meninges, unspecified: Secondary | ICD-10-CM | POA: Insufficient documentation

## 2019-09-12 DIAGNOSIS — K760 Fatty (change of) liver, not elsewhere classified: Secondary | ICD-10-CM

## 2019-09-12 DIAGNOSIS — Z0000 Encounter for general adult medical examination without abnormal findings: Secondary | ICD-10-CM | POA: Diagnosis not present

## 2019-09-12 DIAGNOSIS — D126 Benign neoplasm of colon, unspecified: Secondary | ICD-10-CM

## 2019-09-12 DIAGNOSIS — Z1231 Encounter for screening mammogram for malignant neoplasm of breast: Secondary | ICD-10-CM | POA: Diagnosis not present

## 2019-09-12 DIAGNOSIS — G40201 Localization-related (focal) (partial) symptomatic epilepsy and epileptic syndromes with complex partial seizures, not intractable, with status epilepticus: Secondary | ICD-10-CM

## 2019-09-12 NOTE — Patient Instructions (Signed)
Please return in 6 months for recheck.   I will release your lab results to you on your MyChart account with further instructions. Please reply with any questions.   If you have any questions or concerns, please don't hesitate to send me a message via MyChart or call the office at [phone]. Thank you for visiting with [name] today! It's our pleasure caring for you.  Please call Dr. Jenny Reichmann Perry's office to schedule your colonoscopy that is now due. [phone] I have ordered a mammogram and/or bone density for you as we discussed today: [x]   Mammogram  []   Bone Density  Please call the office checked below to schedule your appointment: Your appointment will at the following location  [x]   [facility]      [place], [place]         []   [facility]  [address] [place], [place]

## 2019-09-12 NOTE — Progress Notes (Signed)
Subjective  Chief Complaint  Patient presents with  . Annual Exam    Fasting labs. Half cup of milk  . Dizziness    Stated that she was still having some dizziness. Last episode was 5 mins ago. Not taking her Meclizine due to hallucinations.   . Health Maintenance    Colonoscopy due. Doesnt want a pap done today.     HPI: [name] is a 54 y.o. female who presents to [facility] [place] today for a Female Wellness Visit. She also has the concerns and/or needs as listed above in the chief complaint. These will be addressed in addition to the Health Maintenance Visit.   Wellness Visit: annual visit with health maintenance review and exam without Pap   Health maintenance: Status post hysterectomy so no Pap smear due.  Mammogram is overdue and she is now willing to go.  Patient to make appointment.  Immunizations are up-to-date Chronic disease f/u and/or acute problem visit: (deemed necessary to be done in addition to the wellness visit):  Newly diagnosed with meningioma and will start radiation treatment next week.  Symptoms have included vision changes and dizziness.  Mood disorder now managed by psychiatry.  Having some anxiety symptoms.  History of elevated liver function test update liver.  Weight is stable.  Due for colon cancer screening surveillance with history of adenomatous polyps.  Patient will schedule  Complains of vaginal itching for the last week or so with mild discharge.  Not at risk for STDs.  No pelvic pain.    Assessment  1. Annual physical exam   2. Meningioma (HCC)   3. Partial symptomatic epilepsy with complex partial seizures, not intractable, with status epilepticus ([place])   4. MDD (major depressive disorder), recurrent episode, mild (HCC)   5. Elevated liver function tests   6. Fatty liver   7. Adenomatous polyp of colon, unspecified part of colon   8. Vagina itching   9. Encounter for screening mammogram for breast cancer        Plan  Female Wellness Visit:  Age appropriate Health Maintenance and Prevention measures were discussed with patient. Included topics are cancer screening recommendations, ways to keep healthy (see AVS) including dietary and exercise recommendations, regular eye and dental care, use of seat belts, and avoidance of moderate alcohol use and tobacco use.  Mammogram and colonoscopy are due.  Patient needs appointments  BMI: discussed patient's BMI and encouraged positive lifestyle modifications to help get to or maintain a target BMI.  HM needs and immunizations were addressed and ordered. See below for orders. See HM and immunization section for updates.  Routine labs and screening tests ordered including cmp, cbc and lipids where appropriate.  Discussed recommendations regarding Vit D and calcium supplementation (see AVS)  Chronic disease management visit and/or acute problem visit:  Seizure disorder managed per neurology and reportedly stable.  I reviewed old records.  Meningioma still radiation treatment.  Reviewed radiation oncology notes.  Patient seen today coping well although she does not feel great.  Mood disorder per psychiatry.  Vaginal itching: Await results of testing.  Will treat for yeast if positive.  Recheck liver tests  Follow up: Return in about 6 months (around 03/12/2020) for recheck.  Orders Placed This Encounter  Procedures  . MM DIGITAL SCREENING BILATERAL  . CBC with Differential/Platelet  . Comprehensive metabolic panel  . Lipid panel  . TSH   No orders of the defined types were placed in  this encounter.     Lifestyle: Body mass index is 28.63 kg/m. Wt Readings from Last 3 Encounters:  09/12/19 146 lb 9.6 oz (66.5 kg)  09/07/19 149 lb (67.6 kg)  03/29/19 155 lb (70.3 kg)    Patient Active Problem List   Diagnosis Date Noted  . MDD (major depressive disorder), recurrent episode, mild ([place]) 01/08/2018    Priority: High  . Sleep apnea 06/09/2017     Priority: High    Documented a year ago- pt felt it was from her seizure medication and did not follow through with C-pap   . Mild neurocognitive disorder 02/04/2017    Priority: High  . Primary insomnia 05/19/2016    Priority: High  . Partial symptomatic epilepsy with complex partial seizures, not intractable, with status epilepticus ([place]) 07/24/2015    Priority: High  . 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 Colon polyp 06/05/2017    Priority: Medium  . Elevated liver function tests 06/05/2017    Priority: Medium  . Fatty liver     Priority: Medium  . OA (osteoarthritis) of knee 05/07/2015    Priority: Medium  . Meningioma ([place]) 09/12/2019  . Benign neoplasm of cerebral meninges ([place]) 09/09/2019  . Anxiety 12/30/2018  . Cardiac arrest ([place]) 12/15/2018  . Widowed - July 2019 08/07/2017  . Bereavement 12/25/2015   Health Maintenance  Topic Date Due  . MAMMOGRAM  06/02/2013  . COLONOSCOPY  06/23/2017  . INFLUENZA VACCINE  08/14/2019  . COVID-19 Vaccine  Completed  . Hepatitis C Screening  Completed  . HIV Screening  Completed   Immunization History  Administered Date(s) Administered  . Influenza, Quadrivalent, Recombinant, Inj, Pf 12/18/2016  . Influenza,inj,Quad PF,6+ Mos 10/25/2012, 11/27/2014, 10/30/2015, 11/10/2016, 09/21/2017, 11/05/2018  . Influenza-Unspecified 10/22/2013  . PFIZER SARS-COV-2 Vaccination 07/27/2019, 08/25/2019  . Pneumococcal Polysaccharide-23 12/15/2012, 11/02/2017   We updated and reviewed the patient's past history in detail and it is documented below. Allergies: Patient is allergic to acetaminophen, benadryl [diphenhydramine hcl], codeine, dilantin [phenytoin sodium extended], melatonin, tramadol, ultram [tramadol hcl], vimpat [lacosamide], mirtazapine, betadine [povidone iodine], latex, penicillins, sulfa antibiotics, tape, and vicodin [hydrocodone-acetaminophen]. Past Medical History Patient  has a past medical history of  Arthritis, Chronic bronchitis ([place]), Colon polyp, Colon polyps, Depression, Eczema, Epilepsy ([place]), Fatty liver, High cholesterol, History of kidney stones, Osteoarthritis, Other convulsions (05/21/12), Restless leg, Seizures ([place]), and Vertigo. Past Surgical History Patient  has a past surgical history that includes Total hip arthroplasty (Left, 1993; 1995; 2000); Abdominal hysterectomy (2001); Colonoscopy; Joint replacement; Mass excision (10/22/2011); Bladder suspension; Cesarean section (6759; 1988); Bunionectomy (Left, 2000); Total knee arthroplasty (Left, 05/07/2015); and Total knee arthroplasty (Right, 10/01/2015). Family History: Patient family history includes Bipolar disorder in her son; Cancer in her brother, maternal aunt, and mother; Depression in her father; Diabetes in her brother and maternal aunt; Drug abuse in her son; Heart attack (age of onset: 29) in her sister. Social History:  Patient  reports that she has never smoked. She has never used smokeless tobacco. She reports that she does not drink alcohol and does not use drugs.  Review of Systems: Constitutional: negative for fever or malaise Ophthalmic: negative for photophobia, double vision or loss of vision Cardiovascular: negative for chest pain, dyspnea on exertion, or new LE swelling Respiratory: negative for SOB or persistent cough Gastrointestinal: negative for abdominal pain, change in bowel habits or melena Genitourinary: negative for dysuria or gross hematuria, no abnormal uterine bleeding or disharge Musculoskeletal: negative for new  gait disturbance or muscular weakness Integumentary: negative for new or persistent rashes, no breast lumps Neurological: negative for TIA or stroke symptoms Psychiatric: negative for SI or delusions Allergic/Immunologic: negative for hives  Patient Care Team    Relationship Specialty Notifications Start End  [name], MD PCP - General Family Medicine  06/05/17   Irene Shipper, MD  Consulting Physician Gastroenterology  06/05/17   Dohmeier, Asencion Partridge, MD Consulting Physician Neurology  06/05/17   Norman Clay, MD Consulting Physician Psychiatry  09/12/19     Objective  Vitals: BP 104/68   Pulse 93   Temp 97.7 F (36.5 C) (Temporal)   Resp 18   Ht 5' (1.524 m)   Wt 146 lb 9.6 oz (66.5 kg)   SpO2 93%   BMI 28.63 kg/m  General:  Well developed, well nourished, no acute distress  Psych:  Alert and orientedx3, flat mood and affect HEENT:  Normocephalic, atraumatic, non-icteric sclera,  supple neck without adenopathy, mass or thyromegaly Cardiovascular:  Normal S1, S2, RRR without gallop, rub or murmur Respiratory:  Good breath sounds bilaterally, CTAB with normal respiratory effort Gastrointestinal: normal bowel sounds, soft, non-tender, no noted masses. No HSM MSK: no deformities, contusions. Joints are without erythema or swelling.  Skin:  Warm, no rashes or suspicious lesions noted Neurologic:    Mental status is normal. CN 2-11 are normal. Gross motor and sensory exams are normal. Normal gait. No tremor Breast Exam: No mass, skin retraction or nipple discharge is appreciated in either breast. No axillary adenopathy. Fibrocystic changes are not noted Pelvic Exam: Normal external genitalia, no vulvar or vaginal lesions present. Clear cervix w/o CMT, white vaginal discharge present..    Commons side effects, risks, benefits, and alternatives for medications and treatment plan prescribed today were discussed, and the patient expressed understanding of the given instructions. Patient is instructed to call or message via MyChart if he/she has any questions or concerns regarding our treatment plan. No barriers to understanding were identified. We discussed Red Flag symptoms and signs in detail. Patient expressed understanding regarding what to do in case of urgent or emergency type symptoms.   Medication list was reconciled, printed and provided to the patient in AVS.  Patient instructions and summary information was reviewed with the patient as documented in the AVS. This note was prepared with assistance of Dragon voice recognition software. Occasional wrong-word or sound-a-like substitutions may have occurred due to the inherent limitations of voice recognition software  This visit occurred during the SARS-CoV-2 public health emergency.  Safety protocols were in place, including screening questions prior to the visit, additional usage of staff PPE, and extensive cleaning of exam room while observing appropriate contact time as indicated for disinfecting solutions.

## 2019-09-13 ENCOUNTER — Ambulatory Visit
Admission: RE | Admit: 2019-09-13 | Discharge: 2019-09-13 | Disposition: A | Discharge status: Home / Self Care | Payer: PPO | Source: Ambulatory Visit | Attending: Radiation Oncology | Admitting: Radiation Oncology

## 2019-09-13 ENCOUNTER — Telehealth: Payer: Self-pay | Admitting: Family Medicine

## 2019-09-13 ENCOUNTER — Encounter: Payer: Self-pay | Admitting: Family Medicine

## 2019-09-13 ENCOUNTER — Other Ambulatory Visit: Payer: Self-pay

## 2019-09-13 ENCOUNTER — Ambulatory Visit: Payer: PPO | Admitting: Radiation Oncology

## 2019-09-13 VITALS — BP 113/84 | HR 88 | Temp 97.8°F | Resp 18 | Ht 61.0 in | Wt 148.8 lb

## 2019-09-13 DIAGNOSIS — D329 Benign neoplasm of meninges, unspecified: Secondary | ICD-10-CM

## 2019-09-13 DIAGNOSIS — R220 Localized swelling, mass and lump, head: Secondary | ICD-10-CM | POA: Diagnosis not present

## 2019-09-13 DIAGNOSIS — D320 Benign neoplasm of cerebral meninges: Secondary | ICD-10-CM | POA: Diagnosis not present

## 2019-09-13 DIAGNOSIS — K116 Mucocele of salivary gland: Secondary | ICD-10-CM | POA: Diagnosis not present

## 2019-09-13 DIAGNOSIS — G930 Cerebral cysts: Secondary | ICD-10-CM | POA: Diagnosis not present

## 2019-09-13 LAB — CBC WITH DIFFERENTIAL/PLATELET
Absolute Monocytes: 586 cells/uL (ref 200–950)
Basophils Absolute: 50 cells/uL (ref 0–200)
Basophils Relative: 0.8 %
Eosinophils Absolute: 82 cells/uL (ref 15–500)
Eosinophils Relative: 1.3 %
HCT: 37 % (ref 35.0–45.0)
Hemoglobin: 11.9 g/dL (ref 11.7–15.5)
Lymphs Abs: 1739 cells/uL (ref 850–3900)
MCH: 30.1 pg (ref 27.0–33.0)
MCHC: 32.2 g/dL (ref 32.0–36.0)
MCV: 93.4 fL (ref 80.0–100.0)
MPV: 10.5 fL (ref 7.5–12.5)
Monocytes Relative: 9.3 %
Neutro Abs: 3843 cells/uL (ref 1500–7800)
Neutrophils Relative %: 61 %
Platelets: 234 10*3/uL (ref 140–400)
RBC: 3.96 10*6/uL (ref 3.80–5.10)
RDW: 12.6 % (ref 11.0–15.0)
Total Lymphocyte: 27.6 %
WBC: 6.3 10*3/uL (ref 3.8–10.8)

## 2019-09-13 LAB — COMPREHENSIVE METABOLIC PANEL
AG Ratio: 1.3 (calc) (ref 1.0–2.5)
ALT: 171 U/L — ABNORMAL HIGH (ref 6–29)
AST: 210 U/L — ABNORMAL HIGH (ref 10–35)
Albumin: 4.1 g/dL (ref 3.6–5.1)
Alkaline phosphatase (APISO): 86 U/L (ref 37–153)
BUN: 13 mg/dL (ref 7–25)
CO2: 25 mmol/L (ref 20–32)
Calcium: 9.7 mg/dL (ref 8.6–10.4)
Chloride: 103 mmol/L (ref 98–110)
Creat: 0.68 mg/dL (ref 0.50–1.05)
Globulin: 3.2 g/dL (calc) (ref 1.9–3.7)
Glucose, Bld: 79 mg/dL (ref 65–99)
Potassium: 4.3 mmol/L (ref 3.5–5.3)
Sodium: 139 mmol/L (ref 135–146)
Total Bilirubin: 1.1 mg/dL (ref 0.2–1.2)
Total Protein: 7.3 g/dL (ref 6.1–8.1)

## 2019-09-13 LAB — CERVICOVAGINAL ANCILLARY ONLY
Bacterial Vaginitis (gardnerella): POSITIVE — AB
Candida Glabrata: NEGATIVE
Candida Vaginitis: NEGATIVE
Comment: NEGATIVE
Comment: NEGATIVE
Comment: NEGATIVE

## 2019-09-13 LAB — TSH: TSH: 2.85 mIU/L

## 2019-09-13 LAB — LIPID PANEL
Cholesterol: 196 mg/dL (ref <200)
HDL: 44 mg/dL — ABNORMAL LOW (ref >=50)
LDL Cholesterol (Calc): 124 mg/dL (calc) — ABNORMAL HIGH
Non-HDL Cholesterol (Calc): 152 mg/dL (calc) — ABNORMAL HIGH (ref <130)
Total CHOL/HDL Ratio: 4.5 (calc) (ref <5.0)
Triglycerides: 167 mg/dL — ABNORMAL HIGH (ref <150)

## 2019-09-13 MED ORDER — METRONIDAZOLE 500 MG PO TABS: 500.0000 mg | ORAL_TABLET | Freq: Two times a day (BID) | ORAL | 0 refills | Status: DC

## 2019-09-13 MED ORDER — GADOBENATE DIMEGLUMINE 529 MG/ML IV SOLN
14.0000 mL | Freq: Once | INTRAVENOUS | Status: AC | PRN
  Administered 2019-09-13: 14 mL via INTRAVENOUS

## 2019-09-13 MED ORDER — SODIUM CHLORIDE 0.9% FLUSH
10.0000 mL | Freq: Once | INTRAVENOUS | Status: AC
  Administered 2019-09-13: 10 mL via INTRAVENOUS

## 2019-09-13 NOTE — Telephone Encounter (Signed)
FYI

## 2019-09-13 NOTE — Progress Notes (Signed)
Has armband been applied?  Yes.    Does patient have an allergy to IV contrast dye?: Yes.     Has patient ever received premedication for IV contrast dye?: No.   Does patient take metformin?: No.  Date of lab work: September 12, 2019 BUN: 13 CR: 0.68  IV site: forearm left, condition patent and no redness  Has IV site been added to flowsheet?  Yes.    Vitals:   09/13/19 0854  BP: 113/84  Pulse: 88  Resp: 18  Temp: 97.8 F (36.6 C)  SpO2: 97%

## 2019-09-13 NOTE — Telephone Encounter (Signed)
Informed patient of message below per Dr. Jonni Sanger. Advised that her niece is not on her DPR - I cannot discuss anything with Estill Bamberg. Informed that Flagyl script sent to pharmacy. Patient declined visit at this time. Did express concerns over liver enzyme levels.

## 2019-09-13 NOTE — Telephone Encounter (Signed)
I reviewed her lab results.  Everything is stable.  Her dizziness could be related to her meningioma. Her other symptoms are likely related to her other medcations.   She can contact her neurologist and psychiatrist to look into other things.   Please order flagyl 500bid x 7 days for her vaginal discharge: BV on lab findings.   Otherwise, can schedule another visit if other things are happening. Niece can come with her if she'd like.

## 2019-09-13 NOTE — Telephone Encounter (Signed)
Patient's niece is calling in this afternoon, stating that she feels like Dr.Andy needs to put in for specific blood work because she says Tamaira is still not feeling right increased dizziness,more fatigued,and is confused. Estill Bamberg states the she feels like something needs to be done ASAP.

## 2019-09-14 NOTE — Telephone Encounter (Signed)
Chronic fatty liver and enzyme elevation

## 2019-09-20 ENCOUNTER — Ambulatory Visit: Payer: PPO | Admitting: Radiation Oncology

## 2019-09-21 DIAGNOSIS — D320 Benign neoplasm of cerebral meninges: Secondary | ICD-10-CM | POA: Insufficient documentation

## 2019-09-22 ENCOUNTER — Telehealth: Payer: Self-pay | Admitting: Neurology

## 2019-09-22 NOTE — Telephone Encounter (Signed)
Pt called and LVM stating that they found a brain tumor and she starts radiation tomorrow. The rest of the message came through jumbled but it sounded like the pt would like anxiety medication called in for her. Please advise.

## 2019-09-22 NOTE — Telephone Encounter (Signed)
Noted, appreciate the patient making [name] aware. In regards to anxiety meds that would need to be discussed with her primary care or the MD treating her brain tumor. If patient returns call please advise her to contact PCP for meds

## 2019-09-23 ENCOUNTER — Ambulatory Visit
Admission: RE | Admit: 2019-09-23 | Discharge: 2019-09-23 | Disposition: A | Discharge status: Home / Self Care | Payer: PPO | Source: Ambulatory Visit | Attending: Radiation Oncology | Admitting: Radiation Oncology

## 2019-09-23 ENCOUNTER — Other Ambulatory Visit: Payer: Self-pay

## 2019-09-23 VITALS — BP 119/79 | HR 81 | Temp 98.2°F | Resp 20

## 2019-09-23 DIAGNOSIS — C7931 Secondary malignant neoplasm of brain: Secondary | ICD-10-CM | POA: Diagnosis not present

## 2019-09-23 DIAGNOSIS — D320 Benign neoplasm of cerebral meninges: Secondary | ICD-10-CM | POA: Diagnosis not present

## 2019-09-23 NOTE — Progress Notes (Signed)
  Radiation Oncology         [phone]) [phone] ________________________________  Name: [name] MRN: [id number]  Date: 09/23/2019  DOB: 1965/02/02  Stereotactic Treatment Procedure Note  SPECIAL TREATMENT PROCEDURE  Outpatient    ICD-10-CM   1. 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  It showed 3-dimensional radiation distributions overlaid onto the planning CT/MRI image set.  [facility] for the target structures as well as the organs at risk were reviewed. The documentation of the 3D plan and dosimetry are filed in the radiation oncology EMR.  NARRATIVE:  [name] was brought to the TrueBeam stereotactic radiation treatment machine and placed supine on the CT couch. The head frame was applied, and the patient was set up for stereotactic radiosurgery.  Neurosurgery was present for the set-up and delivery  SIMULATION VERIFICATION:  In the couch zero-angle position, the patient underwent Exactrac imaging using the Brainlab system with orthogonal KV images.  These were carefully aligned and repeated to confirm treatment position for each of the isocenters.  The Exactrac snap film verification was repeated at each couch angle.  SPECIAL TREATMENT PROCEDURE: [name] received stereotactic radiosurgery to the following targets:  25Gy today will be given in 5 Gy per fraction (5 Gy given today). ExacTrac, 4 vmat beams with 6FFF photon energy max dose=133.8% PTV1 Lt Cavernous sinus 84mm  This constitutes a special treatment procedure due to the ablative dose delivered and the technical nature of treatment.  This highly technical modality of treatment ensures that the ablative dose is centered on the patient's tumor while sparing normal tissues from excessive dose and risk of detrimental effects.  STEREOTACTIC TREATMENT  MANAGEMENT:  Following delivery, the patient was transported to nursing in stable condition and monitored for possible acute effects.  Vital signs were recorded BP 119/79 (BP Location: Left Arm, Patient Position: Sitting, Cuff Size: Normal)   Pulse 81   Temp 98.2 F (36.8 C)   Resp 20   SpO2 98% . The patient tolerated treatment without significant acute effects, and was discharged to home in stable condition.    PLAN: Follow-up for fraction #2. ________________________________   Eppie Gibson, MD

## 2019-09-23 NOTE — Progress Notes (Signed)
  Name: [name]  MRN: [id number]  Date: 09/23/2019   DOB: [birth year]  Stereotactic Radiosurgery Operative Note  PRE-OPERATIVE DIAGNOSIS:  Left cavernous sinus skull base mass  POST-OPERATIVE DIAGNOSIS:  Same  PROCEDURE:  Stereotactic Radiosurgery  SURGEON:  Judith Part, MD  NARRATIVE: The patient underwent a radiation treatment planning session in the radiation oncology simulation suite under the care of the radiation oncology physician and physicist.  I participated closely in the radiation treatment planning afterwards. The patient underwent planning CT which was fused to 3T high resolution MRI with 1 mm axial slices.  These images were fused on the planning system.  We contoured the gross target volumes and subsequently expanded this to yield the Planning Target Volume. I actively participated in the planning process.  I helped to define and review the target contours and also the contours of the optic pathway, eyes, brainstem and selected nearby organs at risk.  All the dose constraints for critical structures were reviewed and compared to AAPM Task Group 101.  The prescription dose conformity was reviewed.  I approved the plan electronically.    Accordingly, [name] was brought to the TrueBeam stereotactic radiation treatment linac and placed in the custom immobilization mask.  The patient was aligned according to the IR fiducial markers with BrainLab Exactrac, then orthogonal x-rays were used in ExacTrac with the 6DOF robotic table and the shifts were made to align the patient  [name] received stereotactic radiosurgery uneventfully.    Lesions treated:  1   Complex lesions treated:  1 (>3.5 cm, <58mm of optic path, or within the brainstem)   The detailed description of the procedure is recorded in the radiation oncology procedure note.  I was present for the duration of the procedure.  DISPOSITION:  Following delivery, the patient was transported to nursing  in stable condition and monitored for possible acute effects to be discharged to home in stable condition with follow-up in one month.  Judith Part, MD 09/23/2019 1:09 PM

## 2019-09-23 NOTE — Progress Notes (Signed)
[name] rested with [name] for 15 minutes following 1 of 5 SRS treatments. Vital signs stable. Patient denies dizziness, nausea, diplopia or ringing in the ears. Reports a mild headache but she feels that is from the face mask fitting tightly across her forehead. Patient otherwise without complaints. Understands to avoid strenuous activity for the next 24 hours and call [phone] with needs after hours or over the weekend. Patient ambulated out of clinic without incident and driven home by her mother.  Vitals:   09/23/19 1329  BP: 119/79  Pulse: 81  Resp: 20  Temp: 98.2 F (36.8 C)  SpO2: 98%

## 2019-09-26 ENCOUNTER — Ambulatory Visit
Admission: RE | Admit: 2019-09-26 | Discharge: 2019-09-26 | Disposition: A | Discharge status: Home / Self Care | Payer: PPO | Source: Ambulatory Visit | Attending: Radiation Oncology | Admitting: Radiation Oncology

## 2019-09-26 VITALS — BP 107/76 | HR 84 | Temp 97.8°F | Resp 18 | Ht 60.0 in

## 2019-09-26 DIAGNOSIS — D320 Benign neoplasm of cerebral meninges: Secondary | ICD-10-CM

## 2019-09-26 NOTE — Progress Notes (Signed)
Ms. Sheltonrested with [name] for 15 minutes following 2 of 5 SRS treatments. Vital signs stable. Patient denies headache (says it's improved from last treatment), nausea, diplopia or ringing in the ears. Reports mild dizziness/unsteadiness on feet. Patient otherwise without complaints. Currently no decadron order. Understands to avoid strenuous activity for the next 24 hours and call [phone] with needs after hours. Patient ambulated out of clinic with stand-by assist without incident and driven home by her mother.  Vitals:   09/26/19 1541  BP: 107/76  Pulse: 84  Resp: 18  Temp: 97.8 F (36.6 C)  SpO2: 98%

## 2019-09-27 NOTE — Progress Notes (Signed)
  Radiation Oncology         [phone]) [phone] ________________________________  Name: JACORIA KEIFFER MRN: [id number]  Date: 09/26/2019  DOB: [birth year]  Stereotactic Treatment Procedure Note  SPECIAL TREATMENT PROCEDURE  Outpatient    ICD-10-CM   1. 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  It showed 3-dimensional radiation distributions overlaid onto the planning CT/MRI image set.  The [facility] for the target structures as well as the organs at risk were reviewed. The documentation of the 3D plan and dosimetry are filed in the radiation oncology EMR.  NARRATIVE:  KLOHE LOVERING was brought to the TrueBeam stereotactic radiation treatment machine and placed supine on the CT couch. The head frame was applied, and the patient was set up for stereotactic radiosurgery.  Neurosurgery was present for the set-up and delivery  SIMULATION VERIFICATION:  In the couch zero-angle position, the patient underwent Exactrac imaging using the Brainlab system with orthogonal KV images.  These were carefully aligned and repeated to confirm treatment position for each of the isocenters.  The Exactrac snap film verification was repeated at each couch angle.  SPECIAL TREATMENT PROCEDURE: [name] received stereotactic radiosurgery to the following targets:  25Gy will be given in 5 Gy per fraction (5 Gy given today). ExacTrac, 4 vmat beams with 6FFF photon energy max dose=133.8% PTV1 Lt Cavernous sinus 51mm  This constitutes a special treatment procedure due to the ablative dose delivered and the technical nature of treatment.  This highly technical modality of treatment ensures that the ablative dose is centered on the patient's tumor while sparing normal tissues from excessive dose and risk of detrimental effects.  STEREOTACTIC TREATMENT  MANAGEMENT:  Following delivery, the patient was transported to nursing in stable condition and monitored for possible acute effects.  Vital signs were recorded BP 107/76 (BP Location: Right Arm, Patient Position: Sitting, Cuff Size: Normal)   Pulse 84   Temp 97.8 F (36.6 C)   Resp 18   Ht 5' (1.524 m)   SpO2 98%   BMI 29.06 kg/m . The patient tolerated treatment without significant acute effects, and was discharged to home in stable condition.    PLAN: Follow-up for next fraction. ________________________________   Eppie Gibson, MD

## 2019-09-28 ENCOUNTER — Other Ambulatory Visit: Payer: Self-pay

## 2019-09-28 ENCOUNTER — Ambulatory Visit
Admission: RE | Admit: 2019-09-28 | Discharge: 2019-09-28 | Disposition: A | Discharge status: Home / Self Care | Payer: PPO | Source: Ambulatory Visit | Attending: Radiation Oncology | Admitting: Radiation Oncology

## 2019-09-28 ENCOUNTER — Telehealth: Payer: Self-pay | Admitting: Family Medicine

## 2019-09-28 ENCOUNTER — Other Ambulatory Visit: Payer: Self-pay | Admitting: Neurology

## 2019-09-28 DIAGNOSIS — D320 Benign neoplasm of cerebral meninges: Secondary | ICD-10-CM | POA: Diagnosis not present

## 2019-09-28 NOTE — Telephone Encounter (Signed)
Patient called regarding liver function test results and has some questions regarding the results

## 2019-09-28 NOTE — Progress Notes (Signed)
Ms. Sheltonrested with [name] for 15 minutes following3 of 5SRS treatments. Vital signsstable.Patient denies headache, dizziness/unsteadiness, nausea, diplopia or ringing in the ears.Patientreports fatigue, but otherwisewithout complaints. Currently no decadron ordered. Understands to avoid strenuous activity for the next 24 hours and call [phone] with needs after hours.Patient ambulated out of clinic with stand-by assist and driven home by her mother  Vitals:   09/28/19 1540  BP: 112/80  Pulse: 87  Resp: 18  Temp: 99.1 F (37.3 C)  SpO2: 98%

## 2019-09-29 NOTE — Telephone Encounter (Signed)
Discussed lab results with patient in detail. States that her current regimen of Prozac is not helping with her mood. Wanting to add another medication. Under a lot of stress with current chemo and radiation appts

## 2019-09-30 ENCOUNTER — Ambulatory Visit
Admission: RE | Admit: 2019-09-30 | Discharge: 2019-09-30 | Disposition: A | Discharge status: Home / Self Care | Payer: PPO | Source: Ambulatory Visit | Attending: Radiation Oncology | Admitting: Radiation Oncology

## 2019-09-30 ENCOUNTER — Other Ambulatory Visit: Payer: Self-pay

## 2019-09-30 VITALS — BP 110/85 | HR 74 | Temp 98.3°F | Resp 18

## 2019-09-30 DIAGNOSIS — D320 Benign neoplasm of cerebral meninges: Secondary | ICD-10-CM

## 2019-09-30 NOTE — Telephone Encounter (Signed)
Pt's mood disorder is managed by psychiatry. Please have her contact her psychiatrist to help manage her worsening anxiety due to radiation treatment.

## 2019-09-30 NOTE — Progress Notes (Signed)
Ms. Sheltonrested with [name] for 15 minutes following4 of 5SRS treatments. Vital signsstable.Patient deniesheadache, dizziness/unsteadiness, nausea, diplopia or ringing in the ears.Patientreports fatigue, but otherwiseis without complaints.Currently no decadron prescription ordered.Understands to avoid strenuous activity for the next 24 hours and to call [phone] with needs after hours and over the weekend.Patient ambulated out of clinicunassisted and was driven home by her mother.  Vitals:   09/30/19 1330  BP: 110/85  Pulse: 74  Resp: 18  Temp: 98.3 F (36.8 C)  SpO2: 99%

## 2019-10-03 ENCOUNTER — Encounter: Payer: Self-pay | Admitting: Radiation Oncology

## 2019-10-03 ENCOUNTER — Ambulatory Visit
Admission: RE | Admit: 2019-10-03 | Discharge: 2019-10-03 | Disposition: A | Discharge status: Home / Self Care | Payer: PPO | Source: Ambulatory Visit | Attending: Radiation Oncology | Admitting: Radiation Oncology

## 2019-10-03 ENCOUNTER — Other Ambulatory Visit: Payer: Self-pay

## 2019-10-03 VITALS — BP 119/86 | HR 75 | Temp 97.7°F | Resp 20

## 2019-10-03 DIAGNOSIS — D320 Benign neoplasm of cerebral meninges: Secondary | ICD-10-CM

## 2019-10-03 DIAGNOSIS — D329 Benign neoplasm of meninges, unspecified: Secondary | ICD-10-CM

## 2019-10-03 NOTE — Progress Notes (Signed)
  Radiation Oncology         [phone]) [phone] ________________________________  Name: LINA HITCH MRN: [id number]  Date: 10/03/2019  DOB: 1965-09-12  Stereotactic Treatment Procedure Note  SPECIAL TREATMENT PROCEDURE  Outpatient    ICD-10-CM   1. Meningioma ([place])  D32.9   2. 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  It showed 3-dimensional radiation distributions overlaid onto the planning CT/MRI image set.  The [facility] for the target structures as well as the organs at risk were reviewed. The documentation of the 3D plan and dosimetry are filed in the radiation oncology EMR.  NARRATIVE:  RAELEEN WINSTANLEY was brought to the TrueBeam stereotactic radiation treatment machine and placed supine on the CT couch. The head frame was applied, and the patient was set up for stereotactic radiosurgery.  Neurosurgery was present for the set-up and delivery  SIMULATION VERIFICATION:  In the couch zero-angle position, the patient underwent Exactrac imaging using the Brainlab system with orthogonal KV images.  These were carefully aligned and repeated to confirm treatment position for each of the isocenters.  The Exactrac snap film verification was repeated at each couch angle.  SPECIAL TREATMENT PROCEDURE: [name] received stereotactic radiosurgery to the following targets:  25Gy will be given in 5 Gy per fraction (5 Gy given today). ExacTrac, 4 vmat beams with 6FFF photon energy max dose=133.8% PTV1 Lt Cavernous sinus 63mm  This constitutes a special treatment procedure due to the ablative dose delivered and the technical nature of treatment.  This highly technical modality of treatment ensures that the ablative dose is centered on the patient's tumor while sparing normal tissues from excessive dose and risk of detrimental  effects.  STEREOTACTIC TREATMENT MANAGEMENT:  Following delivery, the patient was transported to nursing in stable condition and monitored for possible acute effects.  Vital signs were recorded BP 119/86 (BP Location: Left Arm, Patient Position: Sitting, Cuff Size: Normal)   Pulse 75   Temp 97.7 F (36.5 C)   Resp 20   SpO2 99% . The patient tolerated treatment without significant acute effects, and was discharged to home in stable condition.    PLAN: Follow-up  in 56mo, sooner prn.  She will also be seen by neurology for symptom management. ________________________________   Eppie Gibson, MD

## 2019-10-03 NOTE — Progress Notes (Signed)
Ms. Sheltonrested with [name] for 15 minutes following5 of 5SRS treatments. Vital signsstable.Patient deniesheadache, dizziness/unsteadiness,nausea, diplopia or ringing in the ears.Patientreports fatigue, butotherwiseis without complaints.Currently no decadron prescription ordered.Understands to avoid strenuous activity for the next 24 hours and to call [phone] with needs after hours and over the weekend.Provided one month F/U card. Patient states she has an appointment next Monday with a new neurologist (appointments in Epic show Dr. Cecil Cobbs 10/10/19 and 10:30am). Patient ambulated out of clinic with stand-by assist, and was driven home by her mother.   Vitals:   10/03/19 1323  BP: 119/86  Pulse: 75  Resp: 20  Temp: 97.7 F (36.5 C)  SpO2: 99%

## 2019-10-03 NOTE — Telephone Encounter (Signed)
MyChart message sent to patient per patient request.

## 2019-10-03 NOTE — Progress Notes (Signed)
  Radiation Oncology         [phone]) [phone] ________________________________  Name: AVAREY YAEGER MRN: [id number]  Date: 09/30/2019  DOB: 1965-11-03  Stereotactic Treatment Procedure Note  SPECIAL TREATMENT PROCEDURE  Outpatient    ICD-10-CM   1. 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  It showed 3-dimensional radiation distributions overlaid onto the planning CT/MRI image set.  The [facility] for the target structures as well as the organs at risk were reviewed. The documentation of the 3D plan and dosimetry are filed in the radiation oncology EMR.  NARRATIVE:  BURDETTE GERGELY was brought to the TrueBeam stereotactic radiation treatment machine and placed supine on the CT couch. The head frame was applied, and the patient was set up for stereotactic radiosurgery.  Neurosurgery was present for the set-up and delivery  SIMULATION VERIFICATION:  In the couch zero-angle position, the patient underwent Exactrac imaging using the Brainlab system with orthogonal KV images.  These were carefully aligned and repeated to confirm treatment position for each of the isocenters.  The Exactrac snap film verification was repeated at each couch angle.  SPECIAL TREATMENT PROCEDURE: [name] received stereotactic radiosurgery to the following targets:  25Gy will be given in 5 Gy per fraction (5 Gy given today). ExacTrac, 4 vmat beams with 6FFF photon energy max dose=133.8% PTV1 Lt Cavernous sinus 87mm  This constitutes a special treatment procedure due to the ablative dose delivered and the technical nature of treatment.  This highly technical modality of treatment ensures that the ablative dose is centered on the patient's tumor while sparing normal tissues from excessive dose and risk of detrimental effects.  STEREOTACTIC TREATMENT  MANAGEMENT:  Following delivery, the patient was transported to nursing in stable condition and monitored for possible acute effects.  Vital signs were recorded BP 110/85 (BP Location: Left Arm, Patient Position: Sitting, Cuff Size: Normal)   Pulse 74   Temp 98.3 F (36.8 C)   Resp 18   SpO2 99% . The patient tolerated treatment without significant acute effects, and was discharged to home in stable condition.    PLAN: Follow-up for next fraction. ________________________________   Eppie Gibson, MD

## 2019-10-06 DIAGNOSIS — H532 Diplopia: Secondary | ICD-10-CM | POA: Diagnosis not present

## 2019-10-10 ENCOUNTER — Other Ambulatory Visit: Payer: Self-pay

## 2019-10-10 ENCOUNTER — Inpatient Hospital Stay: Payer: PPO | Attending: Internal Medicine | Admitting: Internal Medicine

## 2019-10-10 VITALS — BP 121/85 | HR 87 | Temp 98.6°F | Resp 20 | Ht 60.0 in | Wt 145.6 lb

## 2019-10-10 DIAGNOSIS — Z809 Family history of malignant neoplasm, unspecified: Secondary | ICD-10-CM | POA: Diagnosis not present

## 2019-10-10 DIAGNOSIS — D329 Benign neoplasm of meninges, unspecified: Secondary | ICD-10-CM | POA: Diagnosis not present

## 2019-10-10 DIAGNOSIS — G40409 Other generalized epilepsy and epileptic syndromes, not intractable, without status epilepticus: Secondary | ICD-10-CM

## 2019-10-10 DIAGNOSIS — G40909 Epilepsy, unspecified, not intractable, without status epilepticus: Secondary | ICD-10-CM | POA: Diagnosis not present

## 2019-10-10 DIAGNOSIS — F445 Conversion disorder with seizures or convulsions: Secondary | ICD-10-CM

## 2019-10-11 ENCOUNTER — Telehealth: Payer: Self-pay | Admitting: Internal Medicine

## 2019-10-11 NOTE — Progress Notes (Signed)
[facility] [place], [place] [address] [phone]   New Patient Evaluation  Date of Service: 10/11/19 Patient Name: [name] Patient MRN: [id number] Patient DOB: 11-14-1965 Provider: Ventura Sellers, MD  Identifying Statement:  [name] is a 54 y.o. 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    Referring Provider: [name], [place],  [place] [address]  Oncologic History: 10/03/19: Completes fractionated RT with Dr. [name]  History of Present Illness: The patient's records from the referring physician were obtained and reviewed and the patient interviewed to confirm this HPI.  Youlanda [name] presents today for review of meningioma and neurologic symptoms.  She describes several months history of "side by side" double vision, worsened when looking to the left and a far distance.  This may have been accompanied by some dizziness or gait instability.  Eventually she obtained a brain MRI which demonstrated a dural based bass along left cavernous sinus, meckel's cave c/w meningioma.  Because of difficult surgical access, she underwent fractionated radiosurgery with Dr. [name], in 5 fractions which was completed on 9/20.  She describes relative improvement in visual symptoms since radiation, although at times it does recur.  She also describes 20 years history of seizures, currently managed by neurologist with Keppra and Lamictal.  No recent seizures.  Medications: Current Outpatient Medications on File Prior to Visit  Medication Sig Dispense Refill  . FLUoxetine (PROZAC) 40 MG capsule Take 2 capsules (80 mg total) by mouth daily. 180 capsule 0  . gabapentin (NEURONTIN) 600 MG tablet TAKE 1 TABLET BY MOUTH THREE TIMES DAILY (Patient taking differently: at bedtime. ) 270 tablet 0  . lamoTRIgine (LAMICTAL) 150 MG tablet Take 1 tablet (150 mg total) by  mouth 2 (two) times daily. For mood control 60 tablet 11  . levETIRAcetam (KEPPRA) 1000 MG tablet Take 1 in the morning and 1 tablet at bedtime. 180 tablet 0  . LORazepam (ATIVAN) 1 MG tablet Take 1 tablet (1 mg total) by mouth daily as needed for anxiety. 30 tablet 1  . pramipexole (MIRAPEX) 0.5 MG tablet Take 1 tablet by mouth twice daily 180 tablet 0  . meclizine (ANTIVERT) 25 MG tablet Take 25 mg by mouth 3 (three) times daily as needed. (Patient not taking: Reported on 10/10/2019)     No current facility-administered medications on file prior to visit.    Allergies:  Allergies  Allergen Reactions  . Acetaminophen Other (See Comments)    Has fatty deposits on liver  . Benadryl [Diphenhydramine Hcl] Other (See Comments)    hyperactivity and seizures  . Codeine Itching and Rash    seizures  . Dilantin [Phenytoin Sodium Extended] Other (See Comments)    Elevated LFT's  . Melatonin Other (See Comments)    seizures  . Tramadol Other (See Comments)    "causes seizures"  . Ultram [Tramadol Hcl] Other (See Comments)    Seizures  . Vimpat [Lacosamide] Other (See Comments)    Severe dizziness  . Mirtazapine Other (See Comments)    Potentiated seizure activity  . Betadine [Povidone Iodine] Rash  . Latex Rash  . Penicillins Itching and Rash    Has patient had a PCN reaction causing immediate rash, facial/tongue/throat swelling, SOB or lightheadedness with hypotension: No Has patient had a PCN reaction causing severe rash involving mucus membranes or skin necrosis: No Has patient had a PCN  reaction that required hospitalization No Has patient had a PCN reaction occurring within the last 10 years: No If all of the above answers are "NO", then may proceed with Cephalosporin use.  . Sulfa Antibiotics Rash  . Tape Rash and Other (See Comments)    Paper tape please Paper tape please  . Vicodin [Hydrocodone-Acetaminophen] Itching   Past Medical History:  Past Medical History:  Diagnosis  Date  . Arthritis    "knees" (04/22/2012)  . Chronic bronchitis ([place])    "yearly; when the weather changes" (04/22/2012)  . Colon polyp   . Colon polyps    adenomatous and hyperplastic-  . Depression   . Eczema   . Epilepsy ([place])    "been having them right often here lately" (04/22/2012)  . Fatty liver   . High cholesterol   . History of kidney stones   . Osteoarthritis    [name] 04/22/2012  . Other convulsions 05/21/12   non-epileptic spells  . Restless leg   . Seizures ([place])   . Vertigo    Past Surgical History:  Past Surgical History:  Procedure Laterality Date  . ABDOMINAL HYSTERECTOMY  2001  . BLADDER SUSPENSION    . BUNIONECTOMY Left 2000  . CESAREAN SECTION  1987; 1988  . COLONOSCOPY    . JOINT REPLACEMENT    . MASS EXCISION  10/22/2011   Procedure: EXCISION MASS;  Surgeon: [name], MD;  Location: [facility];  Service: General;  Laterality: Right;  excision right buttock mass  . TOTAL HIP ARTHROPLASTY Left 1993; 1995; 2000  . TOTAL KNEE ARTHROPLASTY Left 05/07/2015   Procedure: TOTAL LEFT KNEE ARTHROPLASTY;  Surgeon: [name], MD;  Location: [facility];  Service: Orthopedics;  Laterality: Left;  . TOTAL KNEE ARTHROPLASTY Right 10/01/2015   Procedure: RIGHT TOTAL KNEE ARTHROPLASTY;  Surgeon: [name], MD;  Location: [facility];  Service: Orthopedics;  Laterality: Right;   Social History:  Social History   Socioeconomic History  . Marital status: Widowed    Spouse name: [name]  . Number of children: 2  . Years of education: 12 +  . Highest education level: Not on file  Occupational History  . Occupation: disability, medical  Tobacco Use  . Smoking status: Never Smoker  . Smokeless tobacco: Never Used  Vaping Use  . Vaping Use: Never used  Substance and Sexual Activity  . Alcohol use: No    Alcohol/week: 0.0 standard drinks    Comment: 12-25-2015 per pt no  . Drug use: No    Comment: 21-12-2015 per pt no   . Sexual activity: Not Currently    Birth  control/protection: Surgical  Other Topics Concern  . Not on file  Social History Narrative   Patient has two adult children.   Patient is disabled.   Patient has a high school education and some trade school.   Patient is right-handed.   Patient drinks four glasses of soda and tea daily.   Social Determinants of Health   Financial Resource Strain:   . Difficulty of Paying Living Expenses: Not on file  Food Insecurity:   . Worried About Charity fundraiser in the Last Year: Not on file  . Ran Out of Food in the Last Year: Not on file  Transportation Needs:   . Lack of Transportation (Medical): Not on file  . Lack of Transportation (Non-Medical): Not on file  Physical Activity:   . Days of Exercise per Week: Not on file  . Minutes  of Exercise per Session: Not on file  Stress:   . Feeling of Stress : Not on file  Social Connections:   . Frequency of Communication with Friends and Family: Not on file  . Frequency of Social Gatherings with Friends and Family: Not on file  . Attends Religious Services: Not on file  . Active Member of Clubs or Organizations: Not on file  . Attends Archivist Meetings: Not on file  . Marital Status: Not on file  Intimate Partner Violence:   . Fear of Current or Ex-Partner: Not on file  . Emotionally Abused: Not on file  . Physically Abused: Not on file  . Sexually Abused: Not on file   Family History:  Family History  Problem Relation Age of Onset  . Cancer Mother   . Depression Father   . Cancer Brother   . Diabetes Brother   . Cancer Maternal Aunt   . Diabetes Maternal Aunt   . Bipolar disorder Son   . Drug abuse Son   . Heart attack Sister 52       during severe illness    Review of Systems: Constitutional: Doesn't report fevers, chills or abnormal weight loss Eyes: Doesn't report blurriness of vision Ears, nose, mouth, throat, and face: Doesn't report sore throat Respiratory: Doesn't report cough, dyspnea or  wheezes Cardiovascular: Doesn't report palpitation, chest discomfort  Gastrointestinal:  Doesn't report nausea, constipation, diarrhea GU: Doesn't report incontinence Skin: Doesn't report skin rashes Neurological: Per HPI Musculoskeletal: Doesn't report joint pain Behavioral/Psych: Doesn't report anxiety  Physical Exam: Vitals:   10/10/19 1100  BP: 121/85  Pulse: 87  Resp: 20  Temp: 98.6 F (37 C)  SpO2: 98%   KPS: 90. General: Alert, cooperative, pleasant, in no acute distress Head: Normal EENT: No conjunctival injection or scleral icterus.  Lungs: Resp effort normal Cardiac: Regular rate Abdomen: Non-distended abdomen Skin: No rashes cyanosis or petechiae. Extremities: No clubbing or edema  Neurologic Exam: Mental Status: Awake, alert, attentive to examiner. Oriented to self and environment. Language is fluent with intact comprehension.  Cranial Nerves: Visual acuity is grossly normal. Visual fields are full. Extra-ocular movements intact. No ptosis. Face is symmetric Motor: Tone and bulk are normal. Power is full in both arms and legs. Reflexes are symmetric, no pathologic reflexes present.  Sensory: Intact to light touch Gait: Normal.   Labs: I have reviewed the data as listed    Component Value Date/Time   NA 139 09/12/2019 1136   NA 144 08/23/2018 1529   K 4.3 09/12/2019 1136   CL 103 09/12/2019 1136   CO2 25 09/12/2019 1136   GLUCOSE 79 09/12/2019 1136   BUN 13 09/12/2019 1136   BUN 13 08/23/2018 1529   CREATININE 0.68 09/12/2019 1136   CALCIUM 9.7 09/12/2019 1136   PROT 7.3 09/12/2019 1136   PROT 6.6 08/23/2018 1529   ALBUMIN 4.5 08/23/2018 1529   AST 210 (H) 09/12/2019 1136   ALT 171 (H) 09/12/2019 1136   ALKPHOS 74 08/23/2018 1529   BILITOT 1.1 09/12/2019 1136   BILITOT 0.3 08/23/2018 1529   GFRNONAA >60 09/07/2019 1516   GFRAA >60 09/07/2019 1516   Lab Results  Component Value Date   WBC 6.3 09/12/2019   NEUTROABS 3,843 09/12/2019   HGB  11.9 09/12/2019   HCT 37.0 09/12/2019   MCV 93.4 09/12/2019   PLT 234 09/12/2019    Imaging:  MR Brain W Wo Contrast  Result Date: 09/14/2019 CLINICAL DATA:  Treatment  planning MRI. EXAM: MRI HEAD WITHOUT AND WITH CONTRAST TECHNIQUE: Multiplanar, multiecho pulse sequences of the brain and surrounding structures were obtained without and with intravenous contrast. CONTRAST:  30mL MULTIHANCE GADOBENATE DIMEGLUMINE 529 MG/ML IV SOLN COMPARISON:  08/20/2019 FINDINGS: Brain: Known avidly enhancing extra-axial mass centered at the distended left cavernous sinus and extending inferiorly to fill the Meckel's cave to the level of the foramen ovale, and extending posteriorly into the left paramedian pontine cistern where there is pontine contact. The irregular shaped defies reproducible measurement. Extension posteriorly along the petrous ridge. No IAC infiltration. No visible sellar infiltration. No second nodule or personal history of malignancy. No incidental infarct, hemorrhage, hydrocephalus, or collection. Brain volume is normal. Vascular: Normal flow voids and vascular enhancements. Skull and upper cervical spine: Degenerative facet spurring. Sinuses/Orbits: No acute finding. Other: Tiny cysts in the left parotid. IMPRESSION: Treatment planning scan with no interval change from study 08/20/2019. 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 Electronically Signed   By: Monte Fantasia M.D.   On: 09/14/2019 04:43     Assessment/Plan Meningioma ([place]) [D32.9]  We appreciate the opportunity to participate in the care of AYLINE DINGUS.  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   We recommended continued surveillance following radiation, with next MRI brain in 3-4 months.  Because of tumor location, she is at risk for further neurologic deficits with downstream  inflammation following radiation.  Epilepsy history was reviewed today as well.  Upon review of medical record, there has been clinical documentation of non-epileptic seizures.  It is unclear whether this is strictly NES or mixed picture with epileptic and NES together.    We recommended continuing Keppra 1000mg  BID and Lamictal 150mg  BID as these have been effective; no recent breakthrough events.  We spent twenty additional minutes teaching regarding the natural history, biology, and historical experience in the treatment of brain tumors. We then discussed in detail the current recommendations for therapy focusing on the mode of administration, mechanism of action, anticipated toxicities, and quality of life issues associated with this plan. We also provided teaching sheets for the patient to take home as an additional resource.  She should return to clinic following MRI brain in January, or sooner if needed.  All questions were answered. The patient knows to call the clinic with any problems, questions or concerns. No barriers to learning were detected.  The total time spent in the encounter was 45 minutes and more than 50% was on counseling and review of test results   Ventura Sellers, MD Medical Director of Neuro-Oncology [facility] [place] 10/11/19 10:20 AM

## 2019-10-11 NOTE — Telephone Encounter (Signed)
Scheduled per 9/27 los. Unable to reach pt. Called multiple times and phone line is busy. Mailing pt appt calendar.

## 2019-10-14 NOTE — Progress Notes (Signed)
Virtual Visit via Telephone Note  I connected with [name] on 10/19/19 at 11:10 AM EDT by telephone and verified that I am speaking with the correct person using two identifiers.   I discussed the limitations, risks, security and privacy concerns of performing an evaluation and management service by telephone and the availability of in person appointments. I also discussed with the patient that there may be a patient responsible charge related to this service. The patient expressed understanding and agreed to proceed.    I discussed the assessment and treatment plan with the patient. The patient was provided an opportunity to ask questions and all were answered. The patient agreed with the plan and demonstrated an understanding of the instructions.   The patient was advised to call back or seek an in-person evaluation if the symptoms worsen or if the condition fails to improve as anticipated.  Location: patient- home, provider- home office   I provided 15 minutes of non-face-to-face time during this encounter.   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  This is a follow-up appointment for depression and anxiety.  She states that she was found to have brain tumor.  She underwent radiation as tumor was not operable. She will get scan to see if the treatment has been effective.  She states that it tore her up when she found out about this diagnosis. She felt scared.  She states that a lot of things are in God's hands. She also states that her husband's birthday was yesterday.  She feels sorry for herself, although she knows that he is in a good place.  She also talks about her boyfriend, who she lost this year.   She reports good support from her family members, who lives nearby.  She thinks she has been doing better lately.  She has occasional insomnia.  She feels down at times.  She has fair appetite.  She has fair energy and motivation.  She denies SI.  She feels anxious and tense at times.  She had a few panic attacks since the last visit.  She feels comfortable staying on her current medication.    Household: live by herself, and her dog Employment: review record until 2004, currently on disability Marital status: widowed, after more than 30 years of marriage Children: 2. Her mother, brother lives in neighborhood   Visit Diagnosis:    ICD-10-CM   1. MDD (major depressive disorder), recurrent episode, mild ([place])  F33.0   2. Anxiety  F41.9 FLUoxetine (PROZAC) 40 MG capsule    LORazepam (ATIVAN) 1 MG tablet    Past Psychiatric History: Please see initial evaluation for full details. I have reviewed the history. No updates at this time.     Past Medical History:  Past Medical History:  Diagnosis Date  . Arthritis    "knees" (04/22/2012)  . Chronic bronchitis ([place])    "yearly; when the weather changes" (04/22/2012)  . Colon polyp   . Colon polyps    adenomatous and hyperplastic-  . Depression   . Eczema   . Epilepsy ([place])    "been having them right often here lately" (04/22/2012)  . Fatty liver   . High  cholesterol   . History of kidney stones   . Osteoarthritis    [name] 04/22/2012  . Other convulsions 05/21/12   non-epileptic spells  . Restless leg   . Seizures ([place])   . Vertigo     Past Surgical History:  Procedure Laterality Date  . ABDOMINAL HYSTERECTOMY  2001  . BLADDER SUSPENSION    . BUNIONECTOMY Left 2000  . CESAREAN SECTION  1987; 1988  . COLONOSCOPY    . JOINT REPLACEMENT    . MASS EXCISION  10/22/2011   Procedure: EXCISION MASS;  Surgeon: [name], MD;  Location: [facility];  Service: General;  Laterality: Right;  excision right buttock mass  . TOTAL HIP  ARTHROPLASTY Left 1993; 1995; 2000  . TOTAL KNEE ARTHROPLASTY Left 05/07/2015   Procedure: TOTAL LEFT KNEE ARTHROPLASTY;  Surgeon: [name], MD;  Location: [facility];  Service: Orthopedics;  Laterality: Left;  . TOTAL KNEE ARTHROPLASTY Right 10/01/2015   Procedure: RIGHT TOTAL KNEE ARTHROPLASTY;  Surgeon: [name], MD;  Location: [facility];  Service: Orthopedics;  Laterality: Right;    Family Psychiatric History: Please see initial evaluation for full details. I have reviewed the history. No updates at this time.     Family History:  Family History  Problem Relation Age of Onset  . Cancer Mother   . Depression Father   . Cancer Brother   . Diabetes Brother   . Cancer Maternal Aunt   . Diabetes Maternal Aunt   . Bipolar disorder Son   . Drug abuse Son   . Heart attack Sister 86       during severe illness    Social History:  Social History   Socioeconomic History  . Marital status: Widowed    Spouse name: [name]  . Number of children: 2  . Years of education: 12 +  . Highest education level: Not on file  Occupational History  . Occupation: disability, medical  Tobacco Use  . Smoking status: Never Smoker  . Smokeless tobacco: Never Used  Vaping Use  . Vaping Use: Never used  Substance and Sexual Activity  . Alcohol use: No    Alcohol/week: 0.0 standard drinks    Comment: 12-25-2015 per pt no  . Drug use: No    Comment: 21-12-2015 per pt no   . Sexual activity: Not Currently    Birth control/protection: Surgical  Other Topics Concern  . Not on file  Social History Narrative   Patient has two adult children.   Patient is disabled.   Patient has a high school education and some trade school.   Patient is right-handed.   Patient drinks four glasses of soda and tea daily.   Social Determinants of Health   Financial Resource Strain:   . Difficulty of Paying Living Expenses: Not on file  Food Insecurity:   . Worried About Charity fundraiser in the Last Year:  Not on file  . Ran Out of Food in the Last Year: Not on file  Transportation Needs:   . Lack of Transportation (Medical): Not on file  . Lack of Transportation (Non-Medical): Not on file  Physical Activity:   . Days of Exercise per Week: Not on file  . Minutes of Exercise per Session: Not on file  Stress:   . Feeling of Stress : Not on file  Social Connections:   . Frequency of Communication with Friends and Family: Not on file  . Frequency of Social Gatherings with Friends and  Family: Not on file  . Attends Religious Services: Not on file  . Active Member of Clubs or Organizations: Not on file  . Attends Archivist Meetings: Not on file  . Marital Status: Not on file    Allergies:  Allergies  Allergen Reactions  . Acetaminophen Other (See Comments)    Has fatty deposits on liver  . Benadryl [Diphenhydramine Hcl] Other (See Comments)    hyperactivity and seizures  . Codeine Itching and Rash    seizures  . Dilantin [Phenytoin Sodium Extended] Other (See Comments)    Elevated LFT's  . Melatonin Other (See Comments)    seizures  . Tramadol Other (See Comments)    "causes seizures"  . Ultram [Tramadol Hcl] Other (See Comments)    Seizures  . Vimpat [Lacosamide] Other (See Comments)    Severe dizziness  . Mirtazapine Other (See Comments)    Potentiated seizure activity  . Betadine [Povidone Iodine] Rash  . Latex Rash  . Penicillins Itching and Rash    Has patient had a PCN reaction causing immediate rash, facial/tongue/throat swelling, SOB or lightheadedness with hypotension: No Has patient had a PCN reaction causing severe rash involving mucus membranes or skin necrosis: No Has patient had a PCN reaction that required hospitalization No Has patient had a PCN reaction occurring within the last 10 years: No If all of the above answers are "NO", then may proceed with Cephalosporin use.  . Sulfa Antibiotics Rash  . Tape Rash and Other (See Comments)    Paper tape  please Paper tape please  . Vicodin [Hydrocodone-Acetaminophen] Itching    Metabolic Disorder Labs: Lab Results  Component Value Date   HGBA1C 5.4 11/28/2015   MPG 108 11/28/2015   No results found for: PROLACTIN Lab Results  Component Value Date   CHOL 196 09/12/2019   TRIG 167 (H) 09/12/2019   HDL 44 (L) 09/12/2019   CHOLHDL 4.5 09/12/2019   VLDL 24.8 09/21/2017   LDLCALC 124 (H) 09/12/2019   LDLCALC 73 09/21/2017   Lab Results  Component Value Date   TSH 2.85 09/12/2019   TSH 1.52 09/21/2017    Therapeutic Level Labs: No results found for: LITHIUM No results found for: VALPROATE No components found for:  CBMZ  Current Medications: Current Outpatient Medications  Medication Sig Dispense Refill  . [START ON 10/29/2019] FLUoxetine (PROZAC) 40 MG capsule Take 2 capsules (80 mg total) by mouth daily. 180 capsule 0  . gabapentin (NEURONTIN) 600 MG tablet TAKE 1 TABLET BY MOUTH THREE TIMES DAILY (Patient taking differently: at bedtime. ) 270 tablet 0  . lamoTRIgine (LAMICTAL) 150 MG tablet Take 1 tablet (150 mg total) by mouth 2 (two) times daily. For mood control 60 tablet 11  . levETIRAcetam (KEPPRA) 1000 MG tablet Take 1 in the morning and 1 tablet at bedtime. 180 tablet 0  . [START ON 10/28/2019] LORazepam (ATIVAN) 1 MG tablet Take 1 tablet (1 mg total) by mouth daily as needed for anxiety. 30 tablet 1  . meclizine (ANTIVERT) 25 MG tablet Take 25 mg by mouth 3 (three) times daily as needed. (Patient not taking: Reported on 10/10/2019)    . pramipexole (MIRAPEX) 0.5 MG tablet Take 1 tablet by mouth twice daily 180 tablet 0   No current facility-administered medications for this visit.     Musculoskeletal: Strength & Muscle Tone: N/A Gait & Station: N/A Patient leans: N/A  Psychiatric Specialty Exam: Review of Systems  Psychiatric/Behavioral: Positive for dysphoric mood and sleep  disturbance. Negative for agitation, behavioral problems, confusion, decreased  concentration, hallucinations, self-injury and suicidal ideas. The patient is nervous/anxious. The patient is not hyperactive.   All other systems reviewed and are negative.   There were no vitals taken for this visit.There is no height or weight on file to calculate BMI.  General Appearance: NA  Eye Contact:  NA  Speech:  Clear and Coherent  Volume:  Normal  Mood:  fine  Affect:  NA  Thought Process:  Coherent  Orientation:  Full (Time, Place, and Person)  Thought Content: Logical   Suicidal Thoughts:  No  Homicidal Thoughts:  No  Memory:  Immediate;   Good  Judgement:  Good  Insight:  Fair  Psychomotor Activity:  Normal  Concentration:  Concentration: Good and Attention Span: Good  Recall:  Good  Fund of Knowledge: Good  Language: Good  Akathisia:  No  Handed:  Right  AIMS (if indicated): not done  Assets:  Communication Skills Desire for Improvement  ADL's:  Intact  Cognition: WNL  Sleep:  Fair   Screenings: AIMS     ED to Hosp-Admission (Discharged) from 10/30/2017 in [facility]  AIMS Total Score 0    AUDIT     ED to Hosp-Admission (Discharged) from 10/30/2017 in [facility]  Alcohol Use Disorder Identification Test Final Score (AUDIT) 0    GAD-7     Office Visit from 01/31/2015 in [facility]  Total GAD-7 Score 15    Mini-Mental     Office Visit from 02/20/2014 in [facility]  Total Score (max 30 points ) 30    PHQ2-9     Office Visit from 09/12/2019 in [facility] Patient Outreach from 08/31/2017 in [facility] Visit from 04/17/2017 in [facility] Office Visit from 04/13/2017 in [facility] Visit from 02/03/2017 in [facility]  PHQ-2 Total Score 5 2 1 2 2   PHQ-9 Total Score 20 14 -- 17 13       Assessment and Plan:  RYIAN LYNDE is a  55 y.o. year old female with a history of depression, pseudoseizure,partialsymptomatic epilepsy with complex partial seizures, osteoarthritis, congenital hip dislocation s/p replacement,, who presents for follow up appointment for below.   1. Anxiety 2. MDD (major depressive disorder), recurrent episode, mild ([place]) Although she continues to report occasional depressive symptoms and anxiety in the context of newly diagnosed meningioma, and her husband's birthday, she has been handling things better lately.  Will continue current dose of fluoxetine as maintenance therapy for depression and anxiety.  Will continue clonazepam as needed for anxiety.  Discussed risk of dependence and oversedation.  She will greatly benefit from supportive therapy/CBT; will make referral.   Plan I have reviewed and updated plans as below 1.Continuefluoxetine 80 mg daily  2.Continue lorazepam1 mg daily as needed for anxiety(she takes this when she has a seizure like episode)  3.. Next appointment: 12/15 at 8:50 for 20 mins, phone   -on gabapentin, Lamictal, Keppra for seizure. - on pramipexole for restless leg  Past trials of medication: fluoxetine,sertraline (increased appetite),citalopram,mirtazapine (patient self discontinued as she was afraid of ), hydroxyzine (hallucinations), Trazodone, lorazepam, clonazepam   The patient demonstrates the following risk factors for suicide: Chronic risk factors for suicide include: psychiatric disorder of depressionand previous suicide attempts of overdosing medication. Acute risk factorsfor suicide include: unemployment and her husband with terminal  illness. Protective factorsfor this patient include: positive social support, coping skills and hope for the future. Considering these factors, the overall suicide risk at this point appears to be low. Patient isappropriate for outpatient follow up.    Norman Clay, MD 10/19/2019, 11:32 AM

## 2019-10-19 ENCOUNTER — Telehealth (INDEPENDENT_AMBULATORY_CARE_PROVIDER_SITE_OTHER): Payer: PPO | Admitting: Psychiatry

## 2019-10-19 ENCOUNTER — Encounter (HOSPITAL_COMMUNITY): Payer: Self-pay | Admitting: Psychiatry

## 2019-10-19 ENCOUNTER — Other Ambulatory Visit: Payer: Self-pay

## 2019-10-19 DIAGNOSIS — F419 Anxiety disorder, unspecified: Secondary | ICD-10-CM | POA: Diagnosis not present

## 2019-10-19 DIAGNOSIS — F330 Major depressive disorder, recurrent, mild: Secondary | ICD-10-CM | POA: Diagnosis not present

## 2019-10-19 MED ORDER — FLUOXETINE HCL 40 MG PO CAPS: 80.0000 mg | ORAL_CAPSULE | Freq: Every day | ORAL | 0 refills | Status: DC

## 2019-10-19 MED ORDER — LORAZEPAM 1 MG PO TABS: 1.0000 mg | ORAL_TABLET | Freq: Every day | ORAL | 1 refills | Status: DC | PRN

## 2019-11-01 ENCOUNTER — Ambulatory Visit
Admission: RE | Admit: 2019-11-01 | Discharge: 2019-11-01 | Disposition: A | Discharge status: Home / Self Care | Payer: PPO | Source: Ambulatory Visit | Attending: Radiation Oncology | Admitting: Radiation Oncology

## 2019-11-01 ENCOUNTER — Other Ambulatory Visit: Payer: Self-pay

## 2019-11-01 VITALS — BP 113/81 | HR 83 | Temp 97.8°F | Resp 18 | Ht 60.0 in | Wt 143.6 lb

## 2019-11-01 DIAGNOSIS — R110 Nausea: Secondary | ICD-10-CM | POA: Insufficient documentation

## 2019-11-01 DIAGNOSIS — Z79899 Other long term (current) drug therapy: Secondary | ICD-10-CM | POA: Insufficient documentation

## 2019-11-01 DIAGNOSIS — C320 Malignant neoplasm of glottis: Secondary | ICD-10-CM | POA: Insufficient documentation

## 2019-11-01 DIAGNOSIS — R5383 Other fatigue: Secondary | ICD-10-CM | POA: Insufficient documentation

## 2019-11-01 DIAGNOSIS — Z923 Personal history of irradiation: Secondary | ICD-10-CM | POA: Insufficient documentation

## 2019-11-01 DIAGNOSIS — D329 Benign neoplasm of meninges, unspecified: Secondary | ICD-10-CM

## 2019-11-01 DIAGNOSIS — D320 Benign neoplasm of cerebral meninges: Secondary | ICD-10-CM

## 2019-11-01 NOTE — Progress Notes (Addendum)
Ms. Zhan presents today for follow-up after completing SRS to the brain on 10/03/2019  Fatigue: reports some improvement and that she's sleeping better at night Neuro: patient reports occasional hallucinations. States one incident when she was talking to someone not there and her mother had to interrupt and help reorient her  Headache: denies   Seizures: none since March 2021  Vision: Patient reports constant blurry vision (she is unable to see at all without her glasses). She also reports occasional double vision when watching television. She states that her left eye typically struggles to focus on what she's looking at.  Auditory: denies any issues  Nausea: patient denies, but does confirm a decrease in appetite/interest in eating. She also reports intermittent lower abdomen discomfort (similar to when she was hospitalized and it was discovered she had a fatty liver)  Mobility: patient reports improvement in balance and steady gait  Fine motor skills: patient reports occasional tremors in her hands, but states she is able to do fine motor skills (like pick up a pen, button a shirt, etc)  Memory: patient reports trouble remembering something someone has just told her, difficulty remembering where she has put an item to relocate it, and finding her words when talking to someone  Weakness/numbness: patient denies Other issues of note: Had F/U with Dr. [facility] Cobbs on 10/10/2019. Patient reports he decreased her Gabapentin prescription to hopefully help resolve hallucinations  Vitals:   11/01/19 1402  BP: 113/81  Pulse: 83  Resp: 18  Temp: 97.8 F (36.6 C)  SpO2: 99%   Wt Readings from Last 3 Encounters:  11/01/19 143 lb 9.6 oz (65.1 kg)  10/10/19 145 lb 9.6 oz (66 kg)  09/13/19 148 lb 12.8 oz (67.5 kg)

## 2019-11-02 ENCOUNTER — Encounter: Payer: Self-pay | Admitting: Radiation Oncology

## 2019-11-02 NOTE — Progress Notes (Signed)
Radiation Oncology         ([phone]) [phone] ________________________________  Name: [name] MRN: [id number]  Date: 11/01/2019  DOB: 02/10/1965  Follow-Up Visit Note  outpatient  CC: [name], MD  [name], MD  Diagnosis and Prior Radiotherapy:    ICD-10-CM   1. Benign neoplasm of cerebral meninges (HCC)  D32.0   2. Meningioma ([place])  D32.9    Radiation Treatment Dates: 09/23/2019 through 10/03/2019 Site Technique Total Dose (Gy) Dose per Fx (Gy) Completed Fx Beam Energies  Meninges: Brain_SRS IMRT 25/25 5 5/5 6XFFF     CHIEF COMPLAINT: Here for follow-up and surveillance of meningioma    Narrative:  The patient returns today for routine follow-up.   [name] presents today for follow-up after completing SRS to the brain on 10/03/2019  Fatigue: reports some improvement and that she's sleeping better at night Neuro: patient reports occasional hallucinations. States one incident when she was talking to someone not there and her mother had to interrupt and help reorient her  Headache: denies   Seizures: none since March 2021  Vision: Patient reports constant blurry vision (she is unable to see at all without her glasses). She also reports occasional double vision when watching television. She states that her left eye typically struggles to focus on what she's looking at. She feels her vision is slightly better since SRS.  Auditory: denies any issues  Nausea: patient denies, but does confirm a decrease in appetite/interest in eating. She also reports intermittent lower abdomen discomfort (similar to when she was hospitalized and it was discovered she had a fatty liver)  Mobility: patient reports improvement in balance and steady gait  Fine motor skills: patient reports occasional tremors in her hands, but states she is able to do fine motor skills (like pick up a pen, button a shirt, etc)  Memory: patient reports trouble remembering something someone has just  told her, difficulty remembering where she has put an item to relocate it, and finding her words when talking to someone  Weakness/numbness: patient denies Other issues of note: Had F/U with Dr. [facility] Cobbs on 10/10/2019. Patient reports he decreased her Gabapentin prescription to hopefully help resolve hallucinations  Overall, the patient feels slightly better a month after SRS than she felt before SRS.  Vitals:   11/01/19 1402  BP: 113/81  Pulse: 83  Resp: 18  Temp: 97.8 F (36.6 C)  SpO2: 99%   Wt Readings from Last 3 Encounters:  11/01/19 143 lb 9.6 oz (65.1 kg)  10/10/19 145 lb 9.6 oz (66 kg)  09/13/19 148 lb 12.8 oz (67.5 kg)                                 ALLERGIES:  is allergic to acetaminophen, benadryl [diphenhydramine hcl], codeine, dilantin [phenytoin sodium extended], melatonin, tramadol, ultram [tramadol hcl], vimpat [lacosamide], mirtazapine, betadine [povidone iodine], latex, penicillins, sulfa antibiotics, tape, and vicodin [hydrocodone-acetaminophen].  Meds: Current Outpatient Medications  Medication Sig Dispense Refill  . FLUoxetine (PROZAC) 40 MG capsule Take 2 capsules (80 mg total) by mouth daily. 180 capsule 0  . gabapentin (NEURONTIN) 600 MG tablet TAKE 1 TABLET BY MOUTH THREE TIMES DAILY (Patient taking differently: at bedtime. ) 270 tablet 0  . lamoTRIgine (LAMICTAL) 150 MG tablet Take 1 tablet (150 mg total) by mouth 2 (two) times daily. For mood control 60 tablet 11  . levETIRAcetam (KEPPRA) 1000 MG  tablet Take 1 in the morning and 1 tablet at bedtime. 180 tablet 0  . LORazepam (ATIVAN) 1 MG tablet Take 1 tablet (1 mg total) by mouth daily as needed for anxiety. 30 tablet 1  . meclizine (ANTIVERT) 25 MG tablet Take 25 mg by mouth 3 (three) times daily as needed. (Patient not taking: Reported on 10/10/2019)    . pramipexole (MIRAPEX) 0.5 MG tablet Take 1 tablet by mouth twice daily 180 tablet 0   No current facility-administered medications for this  encounter.    Physical Findings: The patient is in no acute distress. Patient is alert and oriented.  height is 5' (1.524 m) and weight is 143 lb 9.6 oz (65.1 kg). Her temperature is 97.8 F (36.6 C). Her blood pressure is 113/81 and her pulse is 83. Her respiration is 18 and oxygen saturation is 99%. .     Neuro: When the patient walks she drifts to the right.  She is able to walk independently however.  Strength is symmetric in all of her extremities.  Rapidly alternating movements and finger-to-nose testing are grossly intact.  No tremor, no slurring of speech. extraocular movements are intact.  No nystagmus.  Vision is grossly intact despite blurring.  Lab Findings: Lab Results  Component Value Date   WBC 6.3 09/12/2019   HGB 11.9 09/12/2019   HCT 37.0 09/12/2019   MCV 93.4 09/12/2019   PLT 234 09/12/2019    Radiographic Findings: No results found.  Impression/Plan: She is doing well 1 month after SRS for a meningioma of the left cavernous sinus.  I recommend follow-up imaging in the form of a brain MRI in 5 months.  I have contacted our navigator to arrange this.  The patient may follow-up with Dr. [name] of neurology at this time.  If Dr. [name] does not feel that this is clinically appropriate she may follow-up with her neurosurgeon.  Patient knows that she will be contacted by our navigator about her follow-up plans and should not hesitate to contact us if she has any questions in the interim.  On date of service, in total, I spent 20 minutes on this encounter. Patient was seen in person.  _____________________________________   Eppie Gibson, MD

## 2019-11-02 NOTE — Progress Notes (Signed)
  Patient Name: [name] MRN: [id number] DOB: October 17, 1965 Referring Physician: Billey Chang (Profile Not Attached) Date of Service: 10/03/2019 [facility], [place]                                                        End Of Treatment Note  Diagnoses: Meningioma, Left Cavernous sinus     ICD-10-CM   1. Benign neoplasm of cerebral meninges (HCC)  D32.0     Intent: Curative  Radiation Treatment Dates: 09/23/2019 through 10/03/2019 Site Technique Total Dose (Gy) Dose per Fx (Gy) Completed Fx Beam Energies  Meninges: Brain_SRS IMRT 25/25 5 5/5 6XFFF   Narrative: The patient tolerated radiation therapy relatively well.   Plan: The patient will follow-up with radiation oncology in 16mo .  -----------------------------------  Eppie Gibson, MD

## 2019-11-10 ENCOUNTER — Other Ambulatory Visit: Payer: Self-pay | Admitting: Family Medicine

## 2019-11-11 ENCOUNTER — Encounter: Payer: Self-pay | Admitting: Family Medicine

## 2019-11-11 ENCOUNTER — Other Ambulatory Visit: Payer: Self-pay

## 2019-11-11 ENCOUNTER — Ambulatory Visit (INDEPENDENT_AMBULATORY_CARE_PROVIDER_SITE_OTHER): Payer: PPO | Admitting: Family Medicine

## 2019-11-11 VITALS — BP 102/70 | HR 79 | Temp 98.0°F | Wt 143.0 lb

## 2019-11-11 DIAGNOSIS — Z23 Encounter for immunization: Secondary | ICD-10-CM

## 2019-11-11 DIAGNOSIS — L304 Erythema intertrigo: Secondary | ICD-10-CM | POA: Diagnosis not present

## 2019-11-11 MED ORDER — KETOCONAZOLE 2 % EX CREA: 1.0000 "application " | TOPICAL_CREAM | Freq: Two times a day (BID) | CUTANEOUS | 0 refills | Status: AC

## 2019-11-11 NOTE — Patient Instructions (Signed)
Please follow up if symptoms do not improve or as needed.    Intertrigo Intertrigo is skin irritation or inflammation (dermatitis) that occurs when folds of skin rub together. The irritation can cause a rash and make skin raw and itchy. This condition most commonly occurs in the skin folds of these areas:  Toes.  Armpits.  Groin.  Under the belly.  Under the breasts.  Buttocks. Intertrigo is not passed from person to person (is not contagious). What are the causes? This condition is caused by heat, moisture, rubbing (friction), and not enough air circulation. The condition can be made worse by:  Sweat.  Bacteria.  A fungus, such as yeast. What increases the risk? This condition is more likely to occur if you have moisture in your skin folds. You are more likely to develop this condition if you:  Have diabetes.  Are overweight.  Are not able to move around or are not active.  Live in a warm and moist climate.  Wear splints, braces, or other medical devices.  Are not able to control your bowels or bladder (have incontinence). What are the signs or symptoms? Symptoms of this condition include:  A pink or red skin rash in the skin fold or near the skin fold.  Raw or scaly skin.  Itchiness.  A burning feeling.  Bleeding.  Leaking fluid.  A bad smell. How is this diagnosed? This condition is diagnosed with a medical history and physical exam. You may also have a skin swab to test for bacteria or a fungus. How is this treated? This condition may be treated by:  Cleaning and drying your skin.  Taking an antibiotic medicine or using an antibiotic skin cream for a bacterial infection.  Using an antifungal cream on your skin or taking pills for an infection that was caused by a fungus, such as yeast.  Using a steroid ointment to relieve itchiness and irritation.  Separating the skin fold with a clean cotton cloth to absorb moisture and allow air to flow into  the area. Follow these instructions at home:  Keep the affected area clean and dry.  Do not scratch your skin.  Stay in a cool environment as much as possible. Use an air conditioner or fan, if available.  Apply over-the-counter and prescription medicines only as told by your health care provider.  If you were prescribed an antibiotic medicine, use it as told by your health care provider. Do not stop using the antibiotic even if your condition improves.  Keep all follow-up visits as told by your health care provider. This is important. How is this prevented?   Maintain a healthy weight.  Take care of your feet, especially if you have diabetes. Foot care includes: ? Wearing shoes that fit well. ? Keeping your feet dry. ? Wearing clean, breathable socks.  Protect the skin around your groin and buttocks, especially if you have incontinence. Skin protection includes: ? Following a regular cleaning routine. ? Using skin protectant creams, powders, or ointments. ? Changing protection pads frequently.  Do not wear tight clothes. Wear clothes that are loose, absorbent, and made of cotton.  Wear a bra that gives good support, if needed.  Shower and dry yourself well after activity or exercise. Use a hair dryer on a cool setting to dry between skin folds, especially after you bathe.  If you have diabetes, keep your blood sugar under control. Contact a health care provider if:  Your symptoms do not improve with treatment.  Your symptoms get worse or they spread.  You notice increased redness and warmth.  You have a fever. Summary  Intertrigo is skin irritation or inflammation (dermatitis) that occurs when folds of skin rub together.  This condition is caused by heat, moisture, rubbing (friction), and not enough air circulation.  This condition may be treated by cleaning and drying your skin and with medicines.  Apply over-the-counter and prescription medicines only as told  by your health care provider.  Keep all follow-up visits as told by your health care provider. This is important. This information is not intended to replace advice given to you by your health care provider. Make sure you discuss any questions you have with your health care provider. Document Revised: 06/01/2017 Document Reviewed: 06/01/2017 Elsevier Patient Education  [place].

## 2019-11-11 NOTE — Progress Notes (Signed)
Subjective  CC:  Chief Complaint  Patient presents with  . Rash    naval, groin, and under left breast x 1 month - use left over Flagyl, little to no relief    HPI: [name] is a 54 y.o. female who presents to the office today to address the problems listed above in the chief complaint.  Rash x several weeks to a month: red and itching w/o systemic sxs.    Assessment  1. Intertrigo      Plan   intertrigo:  Education. See avs. Ketoconazole x 1-2 weeks. F/u if needed.   Flu shot today.  Follow up: prn  Visit date not found  No orders of the defined types were placed in this encounter.  Meds ordered this encounter  Medications  . ketoconazole (NIZORAL) 2 % cream    Sig: Apply 1 application topically 2 (two) times daily for 7 days. Then as needed    Dispense:  60 g    Refill:  0      I reviewed the patients updated PMH, FH, and SocHx.    Patient Active Problem List   Diagnosis Date Noted  . Meningioma ([place]) 09/12/2019    Priority: High  . MDD (major depressive disorder), recurrent episode, mild ([place]) 01/08/2018    Priority: High  . Sleep apnea 06/09/2017    Priority: High  . Mild neurocognitive disorder 02/04/2017    Priority: High  . Primary insomnia 05/19/2016    Priority: High  . Partial symptomatic epilepsy with complex partial seizures, not intractable, with status epilepticus ([place]) 07/24/2015    Priority: High  . 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 Colon polyp 06/05/2017    Priority: Medium  . Elevated liver function tests 06/05/2017    Priority: Medium  . Fatty liver     Priority: Medium  . OA (osteoarthritis) of knee 05/07/2015    Priority: Medium  . Benign neoplasm of cerebral meninges ([place]) 09/09/2019  . Anxiety 12/30/2018  . Cardiac arrest ([place]) 12/15/2018  . Widowed - July 2019 08/07/2017   Current Meds  Medication Sig  . FLUoxetine (PROZAC) 40 MG capsule Take 2 capsules (80 mg total) by mouth daily.  Marland Kitchen  gabapentin (NEURONTIN) 600 MG tablet TAKE 1 TABLET BY MOUTH THREE TIMES DAILY (Patient taking differently: at bedtime. )  . lamoTRIgine (LAMICTAL) 150 MG tablet Take 1 tablet (150 mg total) by mouth 2 (two) times daily. For mood control  . levETIRAcetam (KEPPRA) 1000 MG tablet Take 1 in the morning and 1 tablet at bedtime.  Marland Kitchen LORazepam (ATIVAN) 1 MG tablet Take 1 tablet (1 mg total) by mouth daily as needed for anxiety.  . meclizine (ANTIVERT) 25 MG tablet Take 25 mg by mouth 3 (three) times daily as needed.   . pramipexole (MIRAPEX) 0.5 MG tablet Take 1 tablet by mouth twice daily    Allergies: Patient is allergic to acetaminophen, benadryl [diphenhydramine hcl], codeine, dilantin [phenytoin sodium extended], melatonin, tramadol, ultram [tramadol hcl], vimpat [lacosamide], mirtazapine, betadine [povidone iodine], latex, penicillins, sulfa antibiotics, tape, and vicodin [hydrocodone-acetaminophen]. Family History: Patient family history includes Bipolar disorder in her son; Cancer in her brother, maternal aunt, and mother; Depression in her father; Diabetes in her brother and maternal aunt; Drug abuse in her son; Heart attack (age of onset: 69) in her sister. Social History:  Patient  reports that she has never smoked. She has never used smokeless tobacco. She reports that she does not drink  alcohol and does not use drugs.  Review of Systems: Constitutional: Negative for fever malaise or anorexia Cardiovascular: negative for chest pain Respiratory: negative for SOB or persistent cough Gastrointestinal: negative for abdominal pain  Objective  Vitals: BP 102/70   Pulse 79   Temp 98 F (36.7 C) (Temporal)   Wt 143 lb (64.9 kg)   SpO2 96%   BMI 27.93 kg/m  General: no acute distress , A&Ox3  Skin:  Warm, beneath bilateral breasts, navel and groin with elliptical erythematous well demarcated rash.      Commons side effects, risks, benefits, and alternatives for medications and  treatment plan prescribed today were discussed, and the patient expressed understanding of the given instructions. Patient is instructed to call or message via MyChart if he/she has any questions or concerns regarding our treatment plan. No barriers to understanding were identified. We discussed Red Flag symptoms and signs in detail. Patient expressed understanding regarding what to do in case of urgent or emergency type symptoms.   Medication list was reconciled, printed and provided to the patient in AVS. Patient instructions and summary information was reviewed with the patient as documented in the AVS. This note was prepared with assistance of Dragon voice recognition software. Occasional wrong-word or sound-a-like substitutions may have occurred due to the inherent limitations of voice recognition software  This visit occurred during the SARS-CoV-2 public health emergency.  Safety protocols were in place, including screening questions prior to the visit, additional usage of staff PPE, and extensive cleaning of exam room while observing appropriate contact time as indicated for disinfecting solutions.

## 2019-11-14 ENCOUNTER — Telehealth: Payer: Self-pay | Admitting: Family Medicine

## 2019-11-14 ENCOUNTER — Other Ambulatory Visit: Payer: Self-pay

## 2019-11-14 ENCOUNTER — Ambulatory Visit: Payer: PPO

## 2019-11-14 ENCOUNTER — Telehealth: Payer: Self-pay | Admitting: Radiation Therapy

## 2019-11-14 DIAGNOSIS — F419 Anxiety disorder, unspecified: Secondary | ICD-10-CM

## 2019-11-14 DIAGNOSIS — F330 Major depressive disorder, recurrent, mild: Secondary | ICD-10-CM

## 2019-11-14 NOTE — Chronic Care Management (AMB) (Signed)
  Chronic Care Management   Note  11/14/2019 Name: [name] MRN: [id number] DOB: [birth year]  [name] is a 54 y.o. year old female who is a primary care patient of [name], MD. I reached out to [name] by phone today in response to a referral sent by [name]'s PCP, [name], MD.   [name] was given information about Chronic Care Management services today including:  1. CCM service includes personalized support from designated clinical staff supervised by her physician, including individualized plan of care and coordination with other care providers 2. 24/7 contact phone numbers for assistance for urgent and routine care needs. 3. Service will only be billed when office clinical staff spend 20 minutes or more in a month to coordinate care. 4. Only one practitioner may furnish and bill the service in a calendar month. 5. The patient may stop CCM services at any time (effective at the end of the month) by phone call to the office staff.   Patient agreed to services and verbal consent obtained.   Follow up plan:   Lauretta Grill Upstream Scheduler

## 2019-11-14 NOTE — Telephone Encounter (Signed)
I returned a  phone call in reference to a message left by the patient, inquiring about a visit with a liver specialist. Ms. Currie was not available, so I spoke with her niece, [name], instead. I shared that I do not see anything mentioned in Dr. Pearlie Oyster last office note about a referral to a liver specialist, but there was mention of her following up with our neuro-oncologist, Dr. [name]. Ms. Popovich has already been seen by Dr. [name] and has another visit scheduled with him in January, so that has already been addressed.       Estill Bamberg said that Ms. Malbrough probably just confused the call from [facility] earlier today, with [name] here at the cancer center. Estill Bamberg thanked me for the return call, she is aware of Ms. Widmayer's appointment this afternoon and that the referral in question would be coming from the patient's PCP, not Dr. [name].    Mont Dutton R.T.(R)(T) Radiation Special Procedures Navigator

## 2019-11-14 NOTE — Patient Instructions (Addendum)
Hi [name],  It was nice meeting you and your niece, Estill Bamberg! Please reach out to me at [phone] or main office line [phone] with any questions. I will let Dr Jonni Sanger know about your concerns with your liver.  Thank you, Ellin Mayhew, Pharm.D., BCGP Clinical Pharmacist [facility] [phone]  Goals Addressed            This Visit's Progress   . PharmD Care Plan       CARE PLAN ENTRY (see longitudinal plan of care for additional care plan information)  Current Barriers:  . Chronic Disease Management support, education, and care coordination needs related to Depression and Anxiety   Anxiety/Depression - Medication Management/Help with Insurance Related Questions . Pharmacist Clinical Goal(s) o Over the next 180 days, patient will work with PharmD and providers to help ensure access/affordability of medications and review for medication safety. . Current regimen:  . Fluoxetine 40 mg once daily   Lamotrigine 150 mg tablet - one tablet twice daily  Lorazepam 1 mg daily as needed for anxiety . Interventions: o Reviewed current regimen, side effects, and discussed following-up with psychiatry if wanting to consider changes prior to upcoming appointment.  o Provided information on [place] department of insurance medicare counseling for help with plan selection during open enrollment. . Patient self care activities - Over the next 180 days, patient will: o Interior and spatial designer with any insurance related questions o Follow-up with Dr. Modesta Messing for questions on medication adjustments  Medication management . Pharmacist Clinical Goal(s): o Over the next 180 days, patient will work with PharmD and providers to maintain optimal medication adherence . Current pharmacy: [facility] . Interventions o Comprehensive medication review performed. o Continue current medication management strategy . Patient self care activities - Over the next 180 days, patient  will: o Take medications as prescribed o Report any questions or concerns to PharmD and/or provider(s) Initial goal documentation.      The patient verbalized understanding of instructions provided today and agreed to receive a mailed copy of patient instruction and/or educational materials. Telephone follow up appointment with pharmacy team member scheduled for: See next appointment with "Care Management Staff" under "What's Next" below.   Living With Depression Everyone experiences occasional disappointment, sadness, and loss in their lives. When you are feeling down, blue, or sad for at least 2 weeks in a row, it may mean that you have depression. Depression can affect your thoughts and feelings, relationships, daily activities, and physical health. It is caused by changes in the way your brain functions. If you receive a diagnosis of depression, your health care provider will tell you which type of depression you have and what treatment options are available to you. If you are living with depression, there are ways to help you recover from it and also ways to prevent it from coming back. How to cope with lifestyle changes Coping with stress     Stress is your body's reaction to life changes and events, both good and bad. Stressful situations may include:  Getting married.  The death of a spouse.  Losing a job.  Retiring.  Having a baby. Stress can last just a few hours or it can be ongoing. Stress can play a major role in depression, so it is important to learn both how to cope with stress and how to think about it differently. Talk with your health care provider or a counselor if you would  like to learn more about stress reduction. He or she may suggest some stress reduction techniques, such as:  Music therapy. This can include creating music or listening to music. Choose music that you enjoy and that inspires you.  Mindfulness-based meditation. This kind of meditation can be  done while sitting or walking. It involves being aware of your normal breaths, rather than trying to control your breathing.  Centering prayer. This is a kind of meditation that involves focusing on a spiritual word or phrase. Choose a word, phrase, or sacred image that is meaningful to you and that brings you peace.  Deep breathing. To do this, expand your stomach and inhale slowly through your nose. Hold your breath for 3-5 seconds, then exhale slowly, allowing your stomach muscles to relax.  Muscle relaxation. This involves intentionally tensing muscles then relaxing them. Choose a stress reduction technique that fits your lifestyle and personality. Stress reduction techniques take time and practice to develop. Set aside 5-15 minutes a day to do them. Therapists can offer training in these techniques. The training may be covered by some insurance plans. Other things you can do to manage stress include:  Keeping a stress diary. This can help you learn what triggers your stress and ways to control your response.  Understanding what your limits are and saying no to requests or events that lead to a schedule that is too full.  Thinking about how you respond to certain situations. You may not be able to control everything, but you can control how you react.  Adding humor to your life by watching funny films or TV shows.  Making time for activities that help you relax and not feeling guilty about spending your time this way.  Medicines Your health care provider may suggest certain medicines if he or she feels that they will help improve your condition. Avoid using alcohol and other substances that may prevent your medicines from working properly (may interact). It is also important to:  Talk with your pharmacist or health care provider about all the medicines that you take, their possible side effects, and what medicines are safe to take together.  Make it your goal to take part in all treatment  decisions (shared decision-making). This includes giving input on the side effects of medicines. It is best if shared decision-making with your health care provider is part of your total treatment plan. If your health care provider prescribes a medicine, you may not notice the full benefits of it for 4-8 weeks. Most people who are treated for depression need to be on medicine for at least 6-12 months after they feel better. If you are taking medicines as part of your treatment, do not stop taking medicines without first talking to your health care provider. You may need to have the medicine slowly decreased (tapered) over time to decrease the risk of harmful side effects. Relationships Your health care provider may suggest family therapy along with individual therapy and drug therapy. While there may not be family problems that are causing you to feel depressed, it is still important to make sure your family learns as much as they can about your mental health. Having your family's support can help make your treatment successful. How to recognize changes in your condition Everyone has a different response to treatment for depression. Recovery from major depression happens when you have not had signs of major depression for two months. This may mean that you will start to:  Have more interest in  doing activities.  Feel less hopeless than you did 2 months ago.  Have more energy.  Overeat less often, or have better or improving appetite.  Have better concentration. Your health care provider will work with you to decide the next steps in your recovery. It is also important to recognize when your condition is getting worse. Watch for these signs:  Having fatigue or low energy.  Eating too much or too little.  Sleeping too much or too little.  Feeling restless, agitated, or hopeless.  Having trouble concentrating or making decisions.  Having unexplained physical complaints.  Feeling irritable,  angry, or aggressive. Get help as soon as you or your family members notice these symptoms coming back. How to get support and help from others How to talk with friends and family members about your condition  Talking to friends and family members about your condition can provide you with one way to get support and guidance. Reach out to trusted friends or family members, explain your symptoms to them, and let them know that you are working with a health care provider to treat your depression. Financial resources Not all insurance plans cover mental health care, so it is important to check with your insurance carrier. If paying for co-pays or counseling services is a problem, search for a local or county mental health care center. They may be able to offer public mental health care services at low or no cost when you are not able to see a private health care provider. If you are taking medicine for depression, you may be able to get the generic form, which may be less expensive. Some makers of prescription medicines also offer help to patients who cannot afford the medicines they need. Follow these instructions at home:   Get the right amount and quality of sleep.  Cut down on using caffeine, tobacco, alcohol, and other potentially harmful substances.  Try to exercise, such as walking or lifting small weights.  Take over-the-counter and prescription medicines only as told by your health care provider.  Eat a healthy diet that includes plenty of vegetables, fruits, whole grains, low-fat dairy products, and lean protein. Do not eat a lot of foods that are high in solid fats, added sugars, or salt.  Keep all follow-up visits as told by your health care provider. This is important. Contact a health care provider if:  You stop taking your antidepressant medicines, and you have any of these symptoms: ? Nausea. ? Headache. ? Feeling lightheaded. ? Chills and body aches. ? Not being able to  sleep (insomnia).  You or your friends and family think your depression is getting worse. Get help right away if:  You have thoughts of hurting yourself or others. If you ever feel like you may hurt yourself or others, or have thoughts about taking your own life, get help right away. You can go to your nearest emergency department or call:  Your local emergency services (911 in the [place]  A suicide crisis helpline, such as the [place] at 925-519-6337. This is open 24-hours a day. Summary  If you are living with depression, there are ways to help you recover from it and also ways to prevent it from coming back.  Work with your health care team to create a management plan that includes counseling, stress management techniques, and healthy lifestyle habits. This information is not intended to replace advice given to you by your health care provider. Make sure you discuss any  questions you have with your health care provider. Document Revised: 04/23/2018 Document Reviewed: 12/03/2015 Elsevier Patient Education  [place].

## 2019-11-14 NOTE — Progress Notes (Signed)
[facility]  Name: [name] MRN: [id number]   DOB: 08/26/1965  Chief Complaint/ HPI [name], 54 y.o., female, presents for their initial CCM visit with the clinical pharmacist in office. Accompanied by niece, Estill Bamberg.  PCP : [name], MD Encounter Diagnoses  Name Primary?  . MDD (major depressive disorder), recurrent episode, mild ([place]) Yes  . Anxiety     Office Visits:  11/11/2019 (PCP): intertrigo. Ketoconazole x 1-2 weeks. Flu shot given.  Consult Visit: 10/10/2019 (Dr [name]): 10/03/2019 fractionated RT w/ Dr [name], meningioma. 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 Meningioma ([place]) 09/12/2019  . Benign neoplasm of cerebral meninges ([place]) 09/09/2019  . Anxiety 12/30/2018  . Cardiac arrest ([place]) 12/15/2018  . MDD (major depressive disorder), recurrent episode, mild ([place]) 01/08/2018  . Widowed - July 2019 08/07/2017  . Sleep apnea 06/09/2017  . Colon polyp 06/05/2017  . Elevated liver function tests 06/05/2017  . Fatty liver   . Mild neurocognitive disorder 02/04/2017  . 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 Partial symptomatic epilepsy with complex partial seizures, not intractable, with status epilepticus ([place]) 07/24/2015  . OA (osteoarthritis) of knee 05/07/2015  . 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[place]) 08/17/2013   Past Surgical History:  Procedure Laterality Date  . ABDOMINAL HYSTERECTOMY  2001  . BLADDER SUSPENSION    . BUNIONECTOMY Left 2000  . CESAREAN SECTION  1987; 1988  . COLONOSCOPY    . JOINT REPLACEMENT    . MASS EXCISION  10/22/2011   Procedure: EXCISION MASS;  Surgeon: [name], MD;  Location: [facility];  Service: General;  Laterality: Right;  excision right buttock mass  . TOTAL HIP ARTHROPLASTY Left 1993; 1995; 2000  . TOTAL  KNEE ARTHROPLASTY Left 05/07/2015   Procedure: TOTAL LEFT KNEE ARTHROPLASTY;  Surgeon: [name], MD;  [facility];  Service: Orthopedics;  Laterality: Left;  . TOTAL KNEE ARTHROPLASTY Right 10/01/2015   Procedure: RIGHT TOTAL KNEE ARTHROPLASTY;  Surgeon: [name], MD;  [facility];  Service: Orthopedics;  Laterality: Right;   Family History  Problem Relation Age of Onset  . Cancer Mother   . Depression Father   . Cancer Brother   . Diabetes Brother   . Cancer Maternal Aunt   . Diabetes Maternal Aunt   . Bipolar disorder Son   . Drug abuse Son   . Heart attack Sister 40       during severe illness   Allergies  Allergen Reactions  . Acetaminophen Other (See Comments)    Has fatty deposits on liver  . Benadryl [Diphenhydramine Hcl] Other (See Comments)    hyperactivity and seizures  . Codeine Itching and Rash    seizures  . Dilantin [Phenytoin Sodium Extended] Other (See Comments)    Elevated LFT's  . Melatonin Other (See Comments)    seizures  . Tramadol Other (See Comments)    "causes seizures"  . Ultram [Tramadol Hcl] Other (See Comments)    Seizures  . Vimpat [Lacosamide] Other (See Comments)    Severe dizziness  . Mirtazapine Other (See Comments)    Potentiated seizure activity  . Betadine [Povidone Iodine] Rash  . Latex Rash  . Penicillins Itching and Rash    Has patient had a PCN reaction causing immediate rash, facial/tongue/throat swelling, SOB or lightheadedness  with hypotension: No Has patient had a PCN reaction causing severe rash involving mucus membranes or skin necrosis: No Has patient had a PCN reaction that required hospitalization No Has patient had a PCN reaction occurring within the last 10 years: No If all of the above answers are "NO", then may proceed with Cephalosporin use.  . Sulfa Antibiotics Rash  . Tape Rash and Other (See Comments)    Paper tape please Paper tape please  . Vicodin [Hydrocodone-Acetaminophen] Itching    Outpatient Encounter Medications as of 11/14/2019  Medication Sig  . FLUoxetine (PROZAC) 40 MG capsule Take 2 capsules (80 mg total) by mouth daily.  Marland Kitchen gabapentin (NEURONTIN) 600 MG tablet TAKE 1 TABLET BY MOUTH THREE TIMES DAILY (Patient taking differently: at bedtime. )  . ketoconazole (NIZORAL) 2 % cream Apply 1 application topically 2 (two) times daily for 7 days. Then as needed  . lamoTRIgine (LAMICTAL) 150 MG tablet Take 1 tablet (150 mg total) by mouth 2 (two) times daily. For mood control  . levETIRAcetam (KEPPRA) 1000 MG tablet Take 1 in the morning and 1 tablet at bedtime.  Marland Kitchen LORazepam (ATIVAN) 1 MG tablet Take 1 tablet (1 mg total) by mouth daily as needed for anxiety.  . meclizine (ANTIVERT) 25 MG tablet Take 25 mg by mouth 3 (three) times daily as needed.   . pramipexole (MIRAPEX) 0.5 MG tablet Take 1 tablet by mouth twice daily   No facility-administered encounter medications on file as of 11/14/2019.   Patient Care Team    Relationship Specialty Notifications Start End  [name], MD PCP - General Family Medicine  06/05/17   Irene Shipper, MD Consulting Physician Gastroenterology  06/05/17   Dohmeier, Asencion Partridge, MD Consulting Physician Neurology  06/05/17   Norman Clay, MD Consulting Physician Psychiatry  09/12/19   Ventura Sellers, MD Consulting Physician Oncology  10/11/19    Comment: neuro oncology  Madelin Rear, [facility] Pharmacist Pharmacist  11/14/19    Comment: [phone]   Current Diagnosis/Assessment: Goals Addressed            This Visit's Progress   . PharmD Care Plan       CARE PLAN ENTRY (see longitudinal plan of care for additional care plan information)  Current Barriers:  . Chronic Disease Management support, education, and care coordination needs related to Depression and Anxiety   Anxiety/Depression - Medication Management/Help with Insurance Related Questions . Pharmacist Clinical Goal(s) o Over the next 180 days, patient will work with PharmD  and providers to help ensure access/affordability of medications and review for medication safety. . Current regimen:  . Fluoxetine 40 mg once daily   Lamotrigine 150 mg tablet - one tablet twice daily  Lorazepam 1 mg daily as needed for anxiety . Interventions: o Reviewed current regimen, side effects, and discussed following-up with psychiatry if wanting to consider changes prior to upcoming appointment.  o Provided information on [place] department of insurance medicare counseling for help with plan selection during open enrollment. . Patient self care activities - Over the next 180 days, patient will: o Interior and spatial designer with any insurance related questions o Follow-up with Dr. Modesta Messing for questions on medication adjustments  Medication management . Pharmacist Clinical Goal(s): o Over the next 180 days, patient will work with PharmD and providers to maintain optimal medication adherence . Current pharmacy: [facility] . Interventions o Comprehensive medication review performed. o Continue current medication management strategy . Patient self care activities - Over the next 180 days,  patient will: o Take medications as prescribed o Report any questions or concerns to PharmD and/or provider(s) Initial goal documentation.      Fatty liver   Hepatic Function Latest Ref Rng & Units 09/12/2019 08/23/2018 06/15/2018  Total Protein 6.1 - 8.1 g/dL 7.3 6.6 6.2(L)  Albumin 3.8 - 4.9 g/dL - 4.5 3.5  AST 10 - 35 U/L 210(H) 182(H) 80(H)  ALT 6 - 29 U/L 171(H) 238(H) 127(H)  Alk Phosphatase 39 - 117 IU/L - 74 52  Total Bilirubin 0.2 - 1.2 mg/dL 1.1 0.3 0.5  Bilirubin, Direct 0.0 - 0.2 mg/dL - - -   Expresses concern w/ fatty liver, recent LFTs. Describes worsening appetite, eating one small meal per day and ongoing abdominal pain for months if not years. Is requesting additional f/u on this - states not knowing if she should f/u to get labs/see PCP, or have referral to hepatology or  gastroenterology.  Patient has failed these meds in past: n/a. We discussed:  Recent and historical labs related to liver function.   Plan  RPH to f/u with PCP to address any needed pt f/u. Continue current medications  Depression   PHQ9 SCORE ONLY 09/12/2019 08/31/2017 04/17/2017  PHQ-9 Total Score '20 14 1   ' Previous medications: celexa, mirtazapine, paroxetine, Denies side effects, reports ongoing benefit of taking medications but wonders if fluoxetine should be changed due because she's been on for several years feels there may be something better. Patient is currently controlled on the following medications:  . Fluoxetine 40 mg once daily   Lamotrigine 150 mg tablet - one tablet twice daily  Reviewed current regimen, side effects, and discussed following-up with psychiatry if wanting to consider changes prior to upcoming appointment. Expressed understanding on this.   Plan  Continue current medications.  Anxiety    Previous medications: Buspar, SSRIs, alpha2 antag noted above. Denies any abnormal bruising, bleeding from nose or gums or blood in urine or stool. Side effects.  Patient is currently controlled on the following medications:  . Lorazepam 1 mg once daily PRN anxiety  Plan  Continue current medications.  Seizure    Denies any side effects now that gabapentin is taken at reduced dose. Patient is currently controlled on the below medications. Also taking lamotrigine. . Gabapentin 600 mg three times daily . Keppra 1000 mg tablet twice daily   Plan  Continue current medications  Vaccines   Immunization History  Administered Date(s) Administered  . Influenza, Quadrivalent, Recombinant, Inj, Pf 12/18/2016  . Influenza,inj,Quad PF,6+ Mos 10/25/2012, 11/27/2014, 10/30/2015, 11/10/2016, 09/21/2017, 11/05/2018, 11/11/2019  . Influenza-Unspecified 10/22/2013  . PFIZER SARS-COV-2 Vaccination 07/27/2019, 08/25/2019  . Pneumococcal Polysaccharide-23 12/15/2012,  11/02/2017   Reviewed and discussed patient's vaccination history.    Plan  No recommendations at this time.   Medication Management / Care Coordination   Receives prescription medications from:  [facility], [place] - [address] [place] [address] Phone: [phone] Fax: [phone]   Denies any issues with current medication management. Will be following up with [facility] counseling from [place] for advice on plan selection during open enrollment.    Plan   Continue current medication management strategy. ___________________________ SDOH (Social Determinants of Health) assessments performed: Yes.  Future Appointments  Date Time Provider [place]  12/14/2019 11:50 AM [facility] MM 2 GI-BCG[facility] CE  12/15/2019 11:00 AM Lomax, Amy, NP [facility] None  12/28/2019  8:50 AM Norman Clay, MD BH-BHRA None  01/23/2020  10:00 AM Ventura Sellers, MD [facility] None  05/14/2020  1:00 PM [facility] CCM PHARMACIST [facility] PEC   Visit follow-up:  . CPA follow-up: prn. Marland Kitchen RPH follow-up: 6 month telephone visit.  Madelin Rear, Pharm.D., BCGP Clinical Pharmacist [facility] [phone]

## 2019-11-17 ENCOUNTER — Telehealth: Payer: Self-pay

## 2019-11-17 NOTE — Telephone Encounter (Signed)
Received VM from patient stating that her vision seems to have worsened again (double vision is back and more frequent), and that she wanted to know when she would have an appointment with a liver specialist since her last set of labs were abnormal.   In-basket message sent to Dr. [name] and Dr. [name] with above information, and to see if either provider recommended patient come in sooner that January 2022 F/U with Dr. [name] (or just advise patient to continue to monitor symptoms and update if they worsen). Also included patient's PCP Dr. Jonni Sanger for direction on patient's concerns about seeing a liver specialist   Returned patient's call to let her know I received her message, and that I would update her when I heard back from any of her providers with their responses. Patient verbalized understanding and appreciation of call, and denied any other needs at this time. She knows to call my number back should something arise in the future.

## 2019-11-22 ENCOUNTER — Telehealth: Payer: Self-pay

## 2019-11-22 NOTE — Telephone Encounter (Signed)
Pt called stating she just saw her oncologist. Pt said her oncologist recommended she go see a liver specialist and would need a referral. Pt states the oncologist told her for her PCP to place referral. Please advise.

## 2019-11-23 ENCOUNTER — Other Ambulatory Visit: Payer: Self-pay

## 2019-11-23 DIAGNOSIS — R748 Abnormal levels of other serum enzymes: Secondary | ICD-10-CM

## 2019-11-23 DIAGNOSIS — K760 Fatty (change of) liver, not elsewhere classified: Secondary | ICD-10-CM

## 2019-11-23 NOTE — Telephone Encounter (Signed)
Referral placed.

## 2019-11-23 NOTE — Telephone Encounter (Signed)
OK for referral? Please advise

## 2019-11-23 NOTE — Telephone Encounter (Signed)
Please refer to hepatology: has dx of fatty liver; worsening liver enzymes thanks

## 2019-11-24 ENCOUNTER — Other Ambulatory Visit: Payer: Self-pay

## 2019-11-24 ENCOUNTER — Inpatient Hospital Stay: Payer: PPO | Attending: Internal Medicine | Admitting: Internal Medicine

## 2019-11-24 VITALS — BP 114/78 | HR 71 | Temp 97.2°F | Resp 17 | Ht 60.0 in | Wt 142.1 lb

## 2019-11-24 DIAGNOSIS — F445 Conversion disorder with seizures or convulsions: Secondary | ICD-10-CM | POA: Diagnosis not present

## 2019-11-24 DIAGNOSIS — D329 Benign neoplasm of meninges, unspecified: Secondary | ICD-10-CM | POA: Insufficient documentation

## 2019-11-24 MED ORDER — DEXAMETHASONE 4 MG PO TABS: 4.0000 mg | ORAL_TABLET | Freq: Every day | ORAL | 0 refills | Status: DC

## 2019-11-24 NOTE — Progress Notes (Signed)
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 [name]  Interval History:  [name] presents today with new clinical complaints.  She describes double vision, again side by side when looking to the left, worsened at a distance.  Frequency is considerably increased over the past several weeks compared to initially following radiation.  It is "not there all the time" but episodes or recurrences are more common lately.  She denies facial numbness or weakness, no seizures.  Does complain of some forgetfulness and short term memory loss.  H+P (10/10/19) Patient presents today for review of meningioma and neurologic symptoms.  She describes several months history of "side by side" double vision, worsened when looking to the left and a far distance.  This may have been accompanied by some dizziness or gait instability.  Eventually she obtained a brain MRI which demonstrated a dural based bass along left cavernous sinus, meckel's cave c/w meningioma.  Because of difficult surgical access, she underwent fractionated radiosurgery with Dr. [name], in 5 fractions which was completed on 9/20.  She describes relative improvement in visual symptoms since radiation, although at times it does recur.  She also describes 20 years history of seizures, currently managed by neurologist with Keppra and Lamictal.  No recent seizures.  Medications: Current Outpatient Medications on File Prior to Visit  Medication Sig Dispense Refill  . FLUoxetine (PROZAC) 40 MG capsule Take 2 capsules (80 mg total) by mouth daily. 180 capsule 0  . gabapentin  (NEURONTIN) 600 MG tablet TAKE 1 TABLET BY MOUTH THREE TIMES DAILY (Patient taking differently: at bedtime. ) 270 tablet 0  . lamoTRIgine (LAMICTAL) 150 MG tablet Take 1 tablet (150 mg total) by mouth 2 (two) times daily. For mood control 60 tablet 11  . levETIRAcetam (KEPPRA) 1000 MG tablet Take 1 in the morning and 1 tablet at bedtime. 180 tablet 0  . LORazepam (ATIVAN) 1 MG tablet Take 1 tablet (1 mg total) by mouth daily as needed for anxiety. 30 tablet 1  . pramipexole (MIRAPEX) 0.5 MG tablet Take 1 tablet by mouth twice daily 180 tablet 0  . meclizine (ANTIVERT) 25 MG tablet Take 25 mg by mouth 3 (three) times daily as needed.  (Patient not taking: Reported on 11/24/2019)     No current facility-administered medications on file prior to visit.    Allergies:  Allergies  Allergen Reactions  . Acetaminophen Other (See Comments)    Has fatty deposits on liver  . Benadryl [Diphenhydramine Hcl] Other (See Comments)    hyperactivity and seizures  . Codeine Itching and Rash    seizures  . Dilantin [Phenytoin Sodium Extended] Other (See Comments)    Elevated LFT's  . Melatonin Other (See Comments)    seizures  . Tramadol Other (See Comments)    "causes seizures"  . Ultram [Tramadol Hcl] Other (See Comments)    Seizures  . Vimpat [Lacosamide] Other (See Comments)    Severe dizziness  . Mirtazapine Other (See Comments)    Potentiated seizure activity  . Betadine [Povidone Iodine] Rash  . Latex Rash  . Penicillins Itching and Rash    Has  patient had a PCN reaction causing immediate rash, facial/tongue/throat swelling, SOB or lightheadedness with hypotension: No Has patient had a PCN reaction causing severe rash involving mucus membranes or skin necrosis: No Has patient had a PCN reaction that required hospitalization No Has patient had a PCN reaction occurring within the last 10 years: No If all of the above answers are "NO", then may proceed with Cephalosporin use.  . Sulfa  Antibiotics Rash  . Tape Rash and Other (See Comments)    Paper tape please Paper tape please  . Vicodin [Hydrocodone-Acetaminophen] Itching   Past Medical History:  Past Medical History:  Diagnosis Date  . Arthritis    "knees" (04/22/2012)  . Chronic bronchitis ([place])    "yearly; when the weather changes" (04/22/2012)  . Colon polyp   . Colon polyps    adenomatous and hyperplastic-  . Depression   . Eczema   . Epilepsy ([place])    "been having them right often here lately" (04/22/2012)  . Fatty liver   . High cholesterol   . History of kidney stones   . Osteoarthritis    [name] 04/22/2012  . Other convulsions 05/21/12   non-epileptic spells  . Restless leg   . Seizures ([place])   . Vertigo    Past Surgical History:  Past Surgical History:  Procedure Laterality Date  . ABDOMINAL HYSTERECTOMY  2001  . BLADDER SUSPENSION    . BUNIONECTOMY Left 2000  . CESAREAN SECTION  1987; 1988  . COLONOSCOPY    . JOINT REPLACEMENT    . MASS EXCISION  10/22/2011   Procedure: EXCISION MASS;  Surgeon: [name], MD;  Location: [facility];  Service: General;  Laterality: Right;  excision right buttock mass  . TOTAL HIP ARTHROPLASTY Left 1993; 1995; 2000  . TOTAL KNEE ARTHROPLASTY Left 05/07/2015   Procedure: TOTAL LEFT KNEE ARTHROPLASTY;  Surgeon: [name], MD;  [facility];  Service: Orthopedics;  Laterality: Left;  . TOTAL KNEE ARTHROPLASTY Right 10/01/2015   Procedure: RIGHT TOTAL KNEE ARTHROPLASTY;  Surgeon: [name], MD;  [facility];  Service: Orthopedics;  Laterality: Right;   Social History:  Social History   Socioeconomic History  . Marital status: Widowed    Spouse name: [name]  . Number of children: 2  . Years of education: 12 +  . Highest education level: Not on file  Occupational History  . Occupation: disability, medical  Tobacco Use  . Smoking status: Never Smoker  . Smokeless tobacco: Never Used  Vaping Use  . Vaping Use: Never used  Substance and  Sexual Activity  . Alcohol use: No    Alcohol/week: 0.0 standard drinks    Comment: 12-25-2015 per pt no  . Drug use: No    Comment: 21-12-2015 per pt no   . Sexual activity: Not Currently    Birth control/protection: Surgical  Other Topics Concern  . Not on file  Social History Narrative   Patient has two adult children.   Patient is disabled.   Patient has a high school education and some trade school.   Patient is right-handed.   Patient drinks four glasses of soda and tea daily.   Social Determinants of Health   Financial Resource Strain:   . Difficulty of Paying Living Expenses: Not on file  Food Insecurity: No Food Insecurity  . Worried About Charity fundraiser in the Last Year: Never true  . Ran Out of Food in the Last Year: Never true  Transportation  Needs: No Transportation Needs  . Lack of Transportation (Medical): No  . Lack of Transportation (Non-Medical): No  Physical Activity:   . Days of Exercise per Week: Not on file  . Minutes of Exercise per Session: Not on file  Stress:   . Feeling of Stress : Not on file  Social Connections:   . Frequency of Communication with Friends and Family: Not on file  . Frequency of Social Gatherings with Friends and Family: Not on file  . Attends Religious Services: Not on file  . Active Member of Clubs or Organizations: Not on file  . Attends Archivist Meetings: Not on file  . Marital Status: Not on file  Intimate Partner Violence:   . Fear of Current or Ex-Partner: Not on file  . Emotionally Abused: Not on file  . Physically Abused: Not on file  . Sexually Abused: Not on file   Family History:  Family History  Problem Relation Age of Onset  . Cancer Mother   . Depression Father   . Cancer Brother   . Diabetes Brother   . Cancer Maternal Aunt   . Diabetes Maternal Aunt   . Bipolar disorder Son   . Drug abuse Son   . Heart attack Sister 67       during severe illness    Review of  Systems: Constitutional: Doesn't report fevers, chills or abnormal weight loss Eyes: Doesn't report blurriness of vision Ears, nose, mouth, throat, and face: Doesn't report sore throat Respiratory: Doesn't report cough, dyspnea or wheezes Cardiovascular: Doesn't report palpitation, chest discomfort  Gastrointestinal:  Doesn't report nausea, constipation, diarrhea GU: Doesn't report incontinence Skin: Doesn't report skin rashes Neurological: Per HPI Musculoskeletal: Doesn't report joint pain Behavioral/Psych: Doesn't report anxiety  Physical Exam: Vitals:   11/24/19 0911  BP: 114/78  Pulse: 71  Resp: 17  Temp: (!) 97.2 F (36.2 C)  SpO2: 100%   KPS: 90. General: Alert, cooperative, pleasant, in no acute distress Head: Normal EENT: No conjunctival injection or scleral icterus.  Lungs: Resp effort normal Cardiac: Regular rate Abdomen: Non-distended abdomen Skin: No rashes cyanosis or petechiae. Extremities: No clubbing or edema  Neurologic Exam: Mental Status: Awake, alert, attentive to examiner. Oriented to self and environment. Language is fluent with intact comprehension.  Cranial Nerves: Visual acuity is grossly normal. Visual fields are full. Extra-ocular movements intact, with horizontal diplopia on left gaze. No ptosis. Face is symmetric Motor: Tone and bulk are normal. Power is full in both arms and legs. Reflexes are symmetric, no pathologic reflexes present.  Sensory: Intact to light touch Gait: Normal.   Labs: I have reviewed the data as listed    Component Value Date/Time   NA 139 09/12/2019 1136   NA 144 08/23/2018 1529   K 4.3 09/12/2019 1136   CL 103 09/12/2019 1136   CO2 25 09/12/2019 1136   GLUCOSE 79 09/12/2019 1136   BUN 13 09/12/2019 1136   BUN 13 08/23/2018 1529   CREATININE 0.68 09/12/2019 1136   CALCIUM 9.7 09/12/2019 1136   PROT 7.3 09/12/2019 1136   PROT 6.6 08/23/2018 1529   ALBUMIN 4.5 08/23/2018 1529   AST 210 (H) 09/12/2019 1136    ALT 171 (H) 09/12/2019 1136   ALKPHOS 74 08/23/2018 1529   BILITOT 1.1 09/12/2019 1136   BILITOT 0.3 08/23/2018 1529   GFRNONAA >60 09/07/2019 1516   GFRAA >60 09/07/2019 1516   Lab Results  Component Value Date   WBC 6.3 09/12/2019  NEUTROABS 3,843 09/12/2019   HGB 11.9 09/12/2019   HCT 37.0 09/12/2019   MCV 93.4 09/12/2019   PLT 234 09/12/2019     Assessment/Plan Meningioma ([place]) [D32.9]  [name] presents today with clinical changes localizing to the left abducens nerve.  Etiology is very likely inflammation from radiosurgery.  We recommended treatment with corticosteroids, decadron 4mg  daily x7 days.    We will give her a call next week to assess response to this intervention.  If not improved, or worsening, will consider moving up MRI scan to later this month.    We recommended continuing Keppra 1000mg  BID and Lamictal 150mg  BID as these have been effective; no recent breakthrough events.  She will follow up with PCP and hepatology regarding liver enzymes.  No need to adjust AED dosage at this time.  All questions were answered. The patient knows to call the clinic with any problems, questions or concerns. No barriers to learning were detected.  The total time spent in the encounter was 30 minutes and more than 50% was on counseling and review of test results   Ventura Sellers, MD Medical Director of Neuro-Oncology [facility] [place] 11/24/19 9:27 AM

## 2019-11-25 ENCOUNTER — Telehealth: Payer: Self-pay | Admitting: Internal Medicine

## 2019-11-25 NOTE — Telephone Encounter (Signed)
Scheduled per 11/12 los. Pt is aware of appt time and date.

## 2019-12-01 ENCOUNTER — Inpatient Hospital Stay (HOSPITAL_BASED_OUTPATIENT_CLINIC_OR_DEPARTMENT_OTHER): Payer: PPO | Admitting: Internal Medicine

## 2019-12-01 DIAGNOSIS — F445 Conversion disorder with seizures or convulsions: Secondary | ICD-10-CM | POA: Diagnosis not present

## 2019-12-01 DIAGNOSIS — D329 Benign neoplasm of meninges, unspecified: Secondary | ICD-10-CM

## 2019-12-01 NOTE — Progress Notes (Signed)
I connected with [name] on 12/01/19 at 12:00 PM EST by telephone visit and verified that I am speaking with the correct person using two identifiers.  I discussed the limitations, risks, security and privacy concerns of performing an evaluation and management service by telemedicine and the availability of in-person appointments. I also discussed with the patient that there may be a patient responsible charge related to this service. The patient expressed understanding and agreed to proceed.  Other persons participating in the visit and their role in the encounter:  n/a  Patient's location:  Home  Provider's location:  Office  Chief Complaint:  Pseudoseizure [facility])  Meningioma ([place])  History of Present Ilness: [name] describes absolutely no improvement in double vision since starting the decadron 4mg  daily.  Short term memory also is as prior.  No new or progressive neurologic deficits. Observations: Language and cognition stable Assessment and Plan: Pseudoseizure [facility])  Meningioma ([place])  May discontinue decadron due to poor efficacy.  Follow Up Instructions: RTC as scheduled in January following MRI brain   I discussed the assessment and treatment plan with the patient.  The patient was provided an opportunity to ask questions and all were answered.  The patient agreed with the plan and demonstrated understanding of the instructions.    The patient was advised to call back or seek an in-person evaluation if the symptoms worsen or if the condition fails to improve as anticipated.  I provided 5-10 minutes of non-face-to-face time during this enocunter.  Ventura Sellers, MD   I provided 15 minutes of non face-to-face telephone visit time during this encounter, and > 50% was spent counseling as documented under my assessment & plan.

## 2019-12-05 DIAGNOSIS — J40 Bronchitis, not specified as acute or chronic: Secondary | ICD-10-CM | POA: Diagnosis not present

## 2019-12-13 ENCOUNTER — Other Ambulatory Visit: Payer: Self-pay | Admitting: *Deleted

## 2019-12-13 DIAGNOSIS — F445 Conversion disorder with seizures or convulsions: Secondary | ICD-10-CM

## 2019-12-13 DIAGNOSIS — F330 Major depressive disorder, recurrent, mild: Secondary | ICD-10-CM

## 2019-12-13 DIAGNOSIS — G40201 Localization-related (focal) (partial) symptomatic epilepsy and epileptic syndromes with complex partial seizures, not intractable, with status epilepticus: Secondary | ICD-10-CM

## 2019-12-14 ENCOUNTER — Ambulatory Visit: Payer: PPO

## 2019-12-15 ENCOUNTER — Ambulatory Visit: Payer: PPO | Admitting: Family Medicine

## 2019-12-15 ENCOUNTER — Telehealth: Payer: Self-pay | Admitting: *Deleted

## 2019-12-15 NOTE — Telephone Encounter (Signed)
Patient called stated that she was advised to call Dr [name] if she developed any problems.  She states that she has fallen several times over the past three days.  She states that she had difficulty moving her legs after one of the episodes.  She states her mother felt she had a seizure.   Spoke with patients mother she confirmed that patient has been very shaky and is falling with ability to still communicate.  She describes a full body shaking.  This has been occurring off and on over the past three days.  Seizure follow up appt scheduled.

## 2019-12-16 ENCOUNTER — Inpatient Hospital Stay: Payer: PPO

## 2019-12-16 ENCOUNTER — Inpatient Hospital Stay: Payer: PPO | Attending: Internal Medicine | Admitting: Internal Medicine

## 2019-12-16 ENCOUNTER — Other Ambulatory Visit: Payer: Self-pay

## 2019-12-16 VITALS — BP 123/83 | HR 88 | Temp 98.6°F | Resp 18 | Ht 60.0 in | Wt 140.8 lb

## 2019-12-16 DIAGNOSIS — R278 Other lack of coordination: Secondary | ICD-10-CM

## 2019-12-16 DIAGNOSIS — D329 Benign neoplasm of meninges, unspecified: Secondary | ICD-10-CM | POA: Diagnosis not present

## 2019-12-16 DIAGNOSIS — F445 Conversion disorder with seizures or convulsions: Secondary | ICD-10-CM

## 2019-12-16 LAB — CMP (CANCER CENTER ONLY)
ALT: 125 U/L — ABNORMAL HIGH (ref 0–44)
AST: 162 U/L — ABNORMAL HIGH (ref 15–41)
Albumin: 3.3 g/dL — ABNORMAL LOW (ref 3.5–5.0)
Alkaline Phosphatase: 116 U/L (ref 38–126)
Anion gap: 8 (ref 5–15)
BUN: 11 mg/dL (ref 6–20)
CO2: 26 mmol/L (ref 22–32)
Calcium: 9.4 mg/dL (ref 8.9–10.3)
Chloride: 107 mmol/L (ref 98–111)
Creatinine: 0.71 mg/dL (ref 0.44–1.00)
GFR, Estimated: >60 mL/min (ref >60)
Glucose, Bld: 97 mg/dL (ref 70–99)
Potassium: 3.9 mmol/L (ref 3.5–5.1)
Sodium: 141 mmol/L (ref 135–145)
Total Bilirubin: 0.9 mg/dL (ref 0.3–1.2)
Total Protein: 6.8 g/dL (ref 6.5–8.1)

## 2019-12-16 LAB — CBC WITH DIFFERENTIAL (CANCER CENTER ONLY)
Abs Immature Granulocytes: 0.03 10*3/uL (ref 0.00–0.07)
Basophils Absolute: 0 10*3/uL (ref 0.0–0.1)
Basophils Relative: 1 %
Eosinophils Absolute: 0.1 10*3/uL (ref 0.0–0.5)
Eosinophils Relative: 2 %
HCT: 34.8 % — ABNORMAL LOW (ref 36.0–46.0)
Hemoglobin: 11.6 g/dL — ABNORMAL LOW (ref 12.0–15.0)
Immature Granulocytes: 1 %
Lymphocytes Relative: 23 %
Lymphs Abs: 1.5 10*3/uL (ref 0.7–4.0)
MCH: 30.1 pg (ref 26.0–34.0)
MCHC: 33.3 g/dL (ref 30.0–36.0)
MCV: 90.4 fL (ref 80.0–100.0)
Monocytes Absolute: 0.6 10*3/uL (ref 0.1–1.0)
Monocytes Relative: 9 %
Neutro Abs: 4.1 10*3/uL (ref 1.7–7.7)
Neutrophils Relative %: 64 %
Platelet Count: 230 10*3/uL (ref 150–400)
RBC: 3.85 MIL/uL — ABNORMAL LOW (ref 3.87–5.11)
RDW: 14.5 % (ref 11.5–15.5)
WBC Count: 6.4 10*3/uL (ref 4.0–10.5)
nRBC: 0 % (ref 0.0–0.2)

## 2019-12-16 LAB — URINALYSIS, COMPLETE (UACMP) WITH MICROSCOPIC
Bilirubin Urine: NEGATIVE
Glucose, UA: NEGATIVE mg/dL
Ketones, ur: NEGATIVE mg/dL
Leukocytes,Ua: NEGATIVE
Nitrite: NEGATIVE
Protein, ur: NEGATIVE mg/dL
Specific Gravity, Urine: 1.019 (ref 1.005–1.030)
pH: 6 (ref 5.0–8.0)

## 2019-12-16 LAB — AMMONIA: Ammonia: 66 umol/L — ABNORMAL HIGH (ref 9–35)

## 2019-12-16 NOTE — Progress Notes (Signed)
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 [name]  Interval History:  [name] presents today with new clinical complaints.  She describes increase in fatigue from baseline, as well as "shakiness of hands".  Balance feels somewhat "off" and she had fall recently in her driveway.  There may have been an associated seizure, she is unsure.  Continues to complain of some forgetfulness and short term memory loss.  No change in double vision complaint, unfortunately steroids did not help this are were discontinued.  H+P (10/10/19) Patient presents today for review of meningioma and neurologic symptoms.  She describes several months history of "side by side" double vision, worsened when looking to the left and a far distance.  This may have been accompanied by some dizziness or gait instability.  Eventually she obtained a brain MRI which demonstrated a dural based bass along left cavernous sinus, meckel's cave c/w meningioma.  Because of difficult surgical access, she underwent fractionated radiosurgery with Dr. [name], in 5 fractions which was completed on 9/20.  She describes relative improvement in visual symptoms since radiation, although at times it does recur.  She also describes 20 years history of seizures, currently managed by neurologist with Keppra and Lamictal.  No recent seizures.  Medications: Current Outpatient Medications on File Prior to Visit  Medication Sig Dispense Refill  . dexamethasone (DECADRON) 4 MG tablet Take 1 tablet (4 mg total) by mouth daily. 9 tablet 0  . FLUoxetine (PROZAC) 40 MG capsule  Take 2 capsules (80 mg total) by mouth daily. 180 capsule 0  . gabapentin (NEURONTIN) 600 MG tablet TAKE 1 TABLET BY MOUTH THREE TIMES DAILY (Patient taking differently: at bedtime. ) 270 tablet 0  . lamoTRIgine (LAMICTAL) 150 MG tablet Take 1 tablet (150 mg total) by mouth 2 (two) times daily. For mood control 60 tablet 11  . levETIRAcetam (KEPPRA) 1000 MG tablet Take 1 in the morning and 1 tablet at bedtime. 180 tablet 0  . LORazepam (ATIVAN) 1 MG tablet Take 1 tablet (1 mg total) by mouth daily as needed for anxiety. 30 tablet 1  . meclizine (ANTIVERT) 25 MG tablet Take 25 mg by mouth 3 (three) times daily as needed.  (Patient not taking: Reported on 11/24/2019)    . pramipexole (MIRAPEX) 0.5 MG tablet Take 1 tablet by mouth twice daily 180 tablet 0   No current facility-administered medications on file prior to visit.    Allergies:  Allergies  Allergen Reactions  . Acetaminophen Other (See Comments)    Has fatty deposits on liver  . Benadryl [Diphenhydramine Hcl] Other (See Comments)    hyperactivity and seizures  . Codeine Itching and Rash    seizures  . Dilantin [Phenytoin Sodium Extended] Other (See Comments)    Elevated LFT's  . Melatonin Other (See Comments)    seizures  . Tramadol Other (See Comments)    "causes seizures"  . Ultram [Tramadol Hcl] Other (See Comments)    Seizures  . Vimpat [Lacosamide] Other (See Comments)    Severe dizziness  . Mirtazapine Other (See Comments)    Potentiated seizure activity  . Betadine [Povidone  Iodine] Rash  . Latex Rash  . Penicillins Itching and Rash    Has patient had a PCN reaction causing immediate rash, facial/tongue/throat swelling, SOB or lightheadedness with hypotension: No Has patient had a PCN reaction causing severe rash involving mucus membranes or skin necrosis: No Has patient had a PCN reaction that required hospitalization No Has patient had a PCN reaction occurring within the last 10 years: No If all of the above  answers are "NO", then may proceed with Cephalosporin use.  . Sulfa Antibiotics Rash  . Tape Rash and Other (See Comments)    Paper tape please Paper tape please  . Vicodin [Hydrocodone-Acetaminophen] Itching   Past Medical History:  Past Medical History:  Diagnosis Date  . Arthritis    "knees" (04/22/2012)  . Chronic bronchitis ([place])    "yearly; when the weather changes" (04/22/2012)  . Colon polyp   . Colon polyps    adenomatous and hyperplastic-  . Depression   . Eczema   . Epilepsy ([place])    "been having them right often here lately" (04/22/2012)  . Fatty liver   . High cholesterol   . History of kidney stones   . Osteoarthritis    [name] 04/22/2012  . Other convulsions 05/21/12   non-epileptic spells  . Restless leg   . Seizures ([place])   . Vertigo    Past Surgical History:  Past Surgical History:  Procedure Laterality Date  . ABDOMINAL HYSTERECTOMY  2001  . BLADDER SUSPENSION    . BUNIONECTOMY Left 2000  . CESAREAN SECTION  1987; 1988  . COLONOSCOPY    . JOINT REPLACEMENT    . MASS EXCISION  10/22/2011   Procedure: EXCISION MASS;  Surgeon: [name], MD;  Location: [facility];  Service: General;  Laterality: Right;  excision right buttock mass  . TOTAL HIP ARTHROPLASTY Left 1993; 1995; 2000  . TOTAL KNEE ARTHROPLASTY Left 05/07/2015   Procedure: TOTAL LEFT KNEE ARTHROPLASTY;  Surgeon: [name], MD;  Location: [facility];  Service: Orthopedics;  Laterality: Left;  . TOTAL KNEE ARTHROPLASTY Right 10/01/2015   Procedure: RIGHT TOTAL KNEE ARTHROPLASTY;  Surgeon: [name], MD;  Location: [facility];  Service: Orthopedics;  Laterality: Right;   Social History:  Social History   Socioeconomic History  . Marital status: Widowed    Spouse name: [name]  . Number of children: 2  . Years of education: 12 +  . Highest education level: Not on file  Occupational History  . Occupation: disability, medical  Tobacco Use  . Smoking status: Never Smoker  . Smokeless tobacco:  Never Used  Vaping Use  . Vaping Use: Never used  Substance and Sexual Activity  . Alcohol use: No    Alcohol/week: 0.0 standard drinks    Comment: 12-25-2015 per pt no  . Drug use: No    Comment: 21-12-2015 per pt no   . Sexual activity: Not Currently    Birth control/protection: Surgical  Other Topics Concern  . Not on file  Social History Narrative   Patient has two adult children.   Patient is disabled.   Patient has a high school education and some trade school.   Patient is right-handed.   Patient drinks four glasses of soda and tea daily.   Social Determinants of Health   Financial Resource Strain:   . Difficulty of Paying Living Expenses: Not on file  Food Insecurity: No Food Insecurity  . Worried About Charity fundraiser in the Last Year:  Never true  . Ran Out of Food in the Last Year: Never true  Transportation Needs: No Transportation Needs  . Lack of Transportation (Medical): No  . Lack of Transportation (Non-Medical): No  Physical Activity:   . Days of Exercise per Week: Not on file  . Minutes of Exercise per Session: Not on file  Stress:   . Feeling of Stress : Not on file  Social Connections:   . Frequency of Communication with Friends and Family: Not on file  . Frequency of Social Gatherings with Friends and Family: Not on file  . Attends Religious Services: Not on file  . Active Member of Clubs or Organizations: Not on file  . Attends Archivist Meetings: Not on file  . Marital Status: Not on file  Intimate Partner Violence:   . Fear of Current or Ex-Partner: Not on file  . Emotionally Abused: Not on file  . Physically Abused: Not on file  . Sexually Abused: Not on file   Family History:  Family History  Problem Relation Age of Onset  . Cancer Mother   . Depression Father   . Cancer Brother   . Diabetes Brother   . Cancer Maternal Aunt   . Diabetes Maternal Aunt   . Bipolar disorder Son   . Drug abuse Son   . Heart attack Sister  19       during severe illness    Review of Systems: Constitutional: Doesn't report fevers, chills or abnormal weight loss Eyes: Doesn't report blurriness of vision Ears, nose, mouth, throat, and face: Doesn't report sore throat Respiratory: Doesn't report cough, dyspnea or wheezes Cardiovascular: Doesn't report palpitation, chest discomfort  Gastrointestinal:  Doesn't report nausea, constipation, diarrhea GU: Doesn't report incontinence Skin: Doesn't report skin rashes Neurological: Per HPI Musculoskeletal: Doesn't report joint pain Behavioral/Psych: Doesn't report anxiety  Physical Exam: Vitals:   12/16/19 1059  BP: 123/83  Pulse: 88  Resp: 18  Temp: 98.6 F (37 C)  SpO2: 99%   KPS: 80. General: Alert, cooperative, pleasant, in no acute distress Head: Normal EENT: No conjunctival injection or scleral icterus.  Lungs: Resp effort normal Cardiac: Regular rate Abdomen: Non-distended abdomen Skin: No rashes cyanosis or petechiae. Extremities: No clubbing or edema  Neurologic Exam: Mental Status: Awake, alert, attentive to examiner. Oriented to self and environment. Language is fluent with intact comprehension.  Cranial Nerves: Visual acuity is grossly normal. Visual fields are full. Extra-ocular movements intact, with horizontal diplopia on left gaze. No ptosis. Face is symmetric Motor: Tone and bulk are normal. Power is full in both arms and legs. Poly-mini-myoclonus and asterixis with fixed posturing. Reflexes are symmetric, no pathologic reflexes present.  Sensory: Intact to light touch Gait: Independent, wide based  Labs: I have reviewed the data as listed    Component Value Date/Time   NA 139 09/12/2019 1136   NA 144 08/23/2018 1529   K 4.3 09/12/2019 1136   CL 103 09/12/2019 1136   CO2 25 09/12/2019 1136   GLUCOSE 79 09/12/2019 1136   BUN 13 09/12/2019 1136   BUN 13 08/23/2018 1529   CREATININE 0.68 09/12/2019 1136   CALCIUM 9.7 09/12/2019 1136   PROT 7.3  09/12/2019 1136   PROT 6.6 08/23/2018 1529   ALBUMIN 4.5 08/23/2018 1529   AST 210 (H) 09/12/2019 1136   ALT 171 (H) 09/12/2019 1136   ALKPHOS 74 08/23/2018 1529   BILITOT 1.1 09/12/2019 1136   BILITOT 0.3 08/23/2018 1529   GFRNONAA >60  09/07/2019 1516   GFRAA >60 09/07/2019 1516   Lab Results  Component Value Date   WBC 6.3 09/12/2019   NEUTROABS 3,843 09/12/2019   HGB 11.9 09/12/2019   HCT 37.0 09/12/2019   MCV 93.4 09/12/2019   PLT 234 09/12/2019     Assessment/Plan Meningioma ([place]) [D32.9]  DAEJAH KLEBBA presents today with clinical changes consistent with mild encephalopathy.  Etiology is unlikely directly related to tumor (unlike diplopia), and may be result of ongoing hepatic dysfunction noted on labs from August 2021.    We will check serum today for CBC, CMP, ammonia, urinalysis to identify metabolic etiology, if any.   In lieu of any findings of note, recommend she follow up with her PCP.  We will follow up with again next month after scheduled MRI brain.  We recommended continuing Keppra 1000mg  BID and Lamictal 150mg  BID as these have been effective.  She will follow up with PCP and hepatology regarding liver enzymes.    All questions were answered. The patient knows to call the clinic with any problems, questions or concerns. No barriers to learning were detected.  The total time spent in the encounter was 30 minutes and more than 50% was on counseling and review of test results   Ventura Sellers, MD Medical Director of Neuro-Oncology [facility] 12/16/19 10:50 AM

## 2019-12-21 NOTE — Progress Notes (Signed)
Virtual Visit via Telephone Note  I connected with [name] on 12/28/19 at  8:20 AM EST by telephone and verified that I am speaking with the correct person using two identifiers.  Location: Patient: home Provider: office Persons participated in the visit- patient, provider   I discussed the limitations, risks, security and privacy concerns of performing an evaluation and management service by telephone and the availability of in person appointments. I also discussed with the patient that there may be a patient responsible charge related to this service. The patient expressed understanding and agreed to proceed.    I discussed the assessment and treatment plan with the patient. The patient was provided an opportunity to ask questions and all were answered. The patient agreed with the plan and demonstrated an understanding of the instructions.   The patient was advised to call back or seek an in-person evaluation if the symptoms worsen or if the condition fails to improve as anticipated.  I provided 13 minutes of non-face-to-face time during this encounter.   Monica Clay, MD    [facility] MD/PA/NP OP Progress Note  12/28/2019 8:25 AM [name]  MRN:  [id number]  Chief Complaint:  Chief Complaint    Follow-up; Depression     HPI:  This is a follow-up appointment for depression and anxiety.  She states that she had four losses over the few weeks.  Her nephew committed suicide at 31 year old.  One of her sister died from [place], and another from [place].  Although she misses them, she thinks it has been getting better.  She has been feeling stressed due to upcoming appointment for meningioma.  She feels jumpy at times, and asks if lorazepam can be increased.  She has been able to enjoy meeting with her family.  Her brothers and their family visit her house on Thanksgiving.  She has another plan for Christmas.  She has been able to enjoy those time despite her anxiety.  She feels  depressed at times, missing her husband.  She has fair concentration.  She has decreased appetite, and has lost weight for the past few months.  She agrees to discuss this with her provider.  She denies SI.  She denies panic attacks.   Household:live by herself, and her dog. Mother lives across the street Employment: review record until 2004, currently on disability Marital status: widowed, after more than 30 years of marriage Children: 2. Her mother, brother lives in neighborhood  [place] Readings from Last 3 Encounters:  12/16/19 140 lb 12.8 oz (63.9 kg)  11/24/19 142 lb 1.6 oz (64.5 kg)  11/11/19 143 lb (64.9 kg)    Visit Diagnosis: No diagnosis found.  Past Psychiatric History: Please see initial evaluation for full details. I have reviewed the history. No updates at this time.     Past Medical History:  Past Medical History:  Diagnosis Date  . Arthritis    "knees" (04/22/2012)  . Chronic bronchitis ([place])    "yearly; when the weather changes" (04/22/2012)  . Colon polyp   . Colon polyps    adenomatous and hyperplastic-  . Depression   . Eczema   . Epilepsy ([place])    "been having them right often here lately" (04/22/2012)  . Fatty liver   . High cholesterol   . History of kidney stones   . Osteoarthritis    [name] 04/22/2012  . Other convulsions 05/21/12   non-epileptic spells  . Restless leg   . Seizures ([place])   .  Vertigo     Past Surgical History:  Procedure Laterality Date  . ABDOMINAL HYSTERECTOMY  2001  . BLADDER SUSPENSION    . BUNIONECTOMY Left 2000  . CESAREAN SECTION  1987; 1988  . COLONOSCOPY    . JOINT REPLACEMENT    . MASS EXCISION  10/22/2011   Procedure: EXCISION MASS;  Surgeon: [name], MD;  Location: [facility];  Service: General;  Laterality: Right;  excision right buttock mass  . TOTAL HIP ARTHROPLASTY Left 1993; 1995; 2000  . TOTAL KNEE ARTHROPLASTY Left 05/07/2015   Procedure: TOTAL LEFT KNEE ARTHROPLASTY;  Surgeon: [name], MD;   Location: [facility];  Service: Orthopedics;  Laterality: Left;  . TOTAL KNEE ARTHROPLASTY Right 10/01/2015   Procedure: RIGHT TOTAL KNEE ARTHROPLASTY;  Surgeon: [name], MD;  Location: [facility];  Service: Orthopedics;  Laterality: Right;    Family Psychiatric History: Please see initial evaluation for full details. I have reviewed the history. No updates at this time.     Family History:  Family History  Problem Relation Age of Onset  . Cancer Mother   . Depression Father   . Cancer Brother   . Diabetes Brother   . Cancer Maternal Aunt   . Diabetes Maternal Aunt   . Bipolar disorder Son   . Drug abuse Son   . Heart attack Sister 101       during severe illness    Social History:  Social History   Socioeconomic History  . Marital status: Widowed    Spouse name: [name]  . Number of children: 2  . Years of education: 12 +  . Highest education level: Not on file  Occupational History  . Occupation: disability, medical  Tobacco Use  . Smoking status: Never Smoker  . Smokeless tobacco: Never Used  Vaping Use  . Vaping Use: Never used  Substance and Sexual Activity  . Alcohol use: No    Alcohol/week: 0.0 standard drinks    Comment: 12-25-2015 per pt no  . Drug use: No    Comment: 21-12-2015 per pt no   . Sexual activity: Not Currently    Birth control/protection: Surgical  Other Topics Concern  . Not on file  Social History Narrative   Patient has two adult children.   Patient is disabled.   Patient has a high school education and some trade school.   Patient is right-handed.   Patient drinks four glasses of soda and tea daily.   Social Determinants of Health   Financial Resource Strain: Not on file  Food Insecurity: No Food Insecurity  . Worried About Charity fundraiser in the Last Year: Never true  . Ran Out of Food in the Last Year: Never true  Transportation Needs: No Transportation Needs  . Lack of Transportation (Medical): No  . Lack of Transportation  (Non-Medical): No  Physical Activity: Not on file  Stress: Not on file  Social Connections: Not on file    Allergies:  Allergies  Allergen Reactions  . Acetaminophen Other (See Comments)    Has fatty deposits on liver  . Benadryl [Diphenhydramine Hcl] Other (See Comments)    hyperactivity and seizures  . Codeine Itching and Rash    seizures  . Dilantin [Phenytoin Sodium Extended] Other (See Comments)    Elevated LFT's  . Melatonin Other (See Comments)    seizures  . Tramadol Other (See Comments)    "causes seizures"  . Ultram [Tramadol Hcl] Other (See Comments)  Seizures  . Vimpat [Lacosamide] Other (See Comments)    Severe dizziness  . Mirtazapine Other (See Comments)    Potentiated seizure activity  . Betadine [Povidone Iodine] Rash  . Latex Rash  . Penicillins Itching and Rash    Has patient had a PCN reaction causing immediate rash, facial/tongue/throat swelling, SOB or lightheadedness with hypotension: No Has patient had a PCN reaction causing severe rash involving mucus membranes or skin necrosis: No Has patient had a PCN reaction that required hospitalization No Has patient had a PCN reaction occurring within the last 10 years: No If all of the above answers are "NO", then may proceed with Cephalosporin use.  . Sulfa Antibiotics Rash  . Tape Rash and Other (See Comments)    Paper tape please Paper tape please  . Vicodin [Hydrocodone-Acetaminophen] Itching    Metabolic Disorder Labs: Lab Results  Component Value Date   HGBA1C 5.4 11/28/2015   MPG 108 11/28/2015   No results found for: PROLACTIN Lab Results  Component Value Date   CHOL 196 09/12/2019   TRIG 167 (H) 09/12/2019   HDL 44 (L) 09/12/2019   CHOLHDL 4.5 09/12/2019   VLDL 24.8 09/21/2017   LDLCALC 124 (H) 09/12/2019   LDLCALC 73 09/21/2017   Lab Results  Component Value Date   TSH 2.85 09/12/2019   TSH 1.52 09/21/2017    Therapeutic Level Labs: No results found for: LITHIUM No results  found for: VALPROATE No components found for:  CBMZ  Current Medications: Current Outpatient Medications  Medication Sig Dispense Refill  . dexamethasone (DECADRON) 4 MG tablet Take 1 tablet (4 mg total) by mouth daily. 9 tablet 0  . FLUoxetine (PROZAC) 40 MG capsule Take 2 capsules (80 mg total) by mouth daily. 180 capsule 0  . gabapentin (NEURONTIN) 600 MG tablet TAKE 1 TABLET BY MOUTH THREE TIMES DAILY (Patient taking differently: at bedtime. ) 270 tablet 0  . lamoTRIgine (LAMICTAL) 150 MG tablet Take 1 tablet (150 mg total) by mouth 2 (two) times daily. For mood control 60 tablet 5  . levETIRAcetam (KEPPRA) 1000 MG tablet Take 1 in the morning and 1 tablet at bedtime. 180 tablet 0  . LORazepam (ATIVAN) 1 MG tablet Take 1 tablet (1 mg total) by mouth daily as needed for anxiety. 30 tablet 1  . meclizine (ANTIVERT) 25 MG tablet Take 25 mg by mouth 3 (three) times daily as needed.  (Patient not taking: Reported on 11/24/2019)    . pramipexole (MIRAPEX) 0.5 MG tablet Take 1 tablet by mouth twice daily 180 tablet 0   No current facility-administered medications for this visit.     Musculoskeletal: Strength & Muscle Tone: N/A Gait & Station: N/A Patient leans: N/A  Psychiatric Specialty Exam: Review of Systems  Psychiatric/Behavioral: Positive for dysphoric mood. Negative for agitation, behavioral problems, confusion, decreased concentration, hallucinations, self-injury, sleep disturbance and suicidal ideas. The patient is nervous/anxious. The patient is not hyperactive.   All other systems reviewed and are negative.   There were no vitals taken for this visit.There is no height or weight on file to calculate BMI.  General Appearance: NA  Eye Contact:  NA  Speech:  Clear and Coherent  Volume:  Normal  Mood:  fine  Affect:  NA  Thought Process:  Coherent  Orientation:  Full (Time, Place, and Person)  Thought Content: Logical   Suicidal Thoughts:  No  Homicidal Thoughts:  No   Memory:  Immediate;   Good  Judgement:  Good  Insight:  Fair  Psychomotor Activity:  Normal  Concentration:  Concentration: Good and Attention Span: Good  Recall:  Good  Fund of Knowledge: Good  Language: Good  Akathisia:  No  Handed:  Right  AIMS (if indicated): not done  Assets:  Communication Skills Desire for Improvement  ADL's:  Intact  Cognition: WNL  Sleep:  Fair   Screenings: AIMS   Flowsheet Row ED to Hosp-Admission (Discharged) from 10/30/2017 in [facility]  AIMS Total Score 0    AUDIT   Flowsheet Row ED to Hosp-Admission (Discharged) from 10/30/2017 in [facility]  Alcohol Use Disorder Identification Test Final Score (AUDIT) 0    GAD-7   Flowsheet Row Office Visit from 01/31/2015 in [place]  Total GAD-7 Score [place] Office Visit from 02/20/2014 in [facility]  Total Score (max 30 points ) 30    PHQ2-9   [place] Visit from 09/12/2019 in [facility] Patient Outreach from 08/31/2017 in [facility] Visit from 04/17/2017 in [place] Office Visit from 04/13/2017 in [facility] Office Visit from 02/03/2017 in [facility]  PHQ-2 Total Score 5 2 1 2 2   PHQ-9 Total Score 20 14 -- 17 13       Assessment and Plan:  ATTICUS LEMBERGER is a 55 y.o. year old female with a history of  depression, pseudoseizure,partialsymptomatic epilepsy with complex partial seizures, osteoarthritis, congenital hip dislocation s/p replacement, who presents for follow up appointment for below.   1. Anxiety 2. MDD (major depressive disorder), recurrent episode, mild ([place]) Although she reports occasional depressive symptoms and anxiety, she has been handling things fairly well.  Psychosocial stressors includes recent loss of her family,  her medical condition of meningioma, and grief of loss of her husband.  Will continue current dose of fluoxetine as maintenance therapy for depression and anxiety.  Will continue lorazepam as needed for anxiety.  Discussed risk of dependence and oversedation. Will not plan to uptitrate this medication at this time given its potential risk.   Plan I have reviewed and updated plans as below 1.Continuefluoxetine 80 mg daily  2.Continue lorazepam1 mg daily as needed for anxiety(she takes this when she has a seizure like episode)  3.. Next appointment:3.9 at 11 AM  for 20 mins, phone  -on gabapentin, Lamictal, Keppra for seizure. - on pramipexole for restless leg  Past trials of medication: fluoxetine,sertraline (increased appetite),citalopram,mirtazapine (patient self discontinued as she was afraid of ), hydroxyzine (hallucinations),Trazodone, lorazepam, clonazepam   The patient demonstrates the following risk factors for suicide: Chronic risk factors for suicide include: psychiatric disorder of depressionand previous suicide attempts of overdosing medication. Acute risk factorsfor suicide include: unemployment and her husband with terminal illness. Protective factorsfor this patient include: positive social support, coping skills and hope for the future. Considering these factors, the overall suicide risk at this point appears to be low. Patient isappropriate for outpatient follow up.   Monica Clay, MD 12/28/2019, 8:25 AM

## 2019-12-22 ENCOUNTER — Encounter: Payer: Self-pay | Admitting: *Deleted

## 2019-12-22 ENCOUNTER — Ambulatory Visit: Payer: PPO | Admitting: Neurology

## 2019-12-22 ENCOUNTER — Other Ambulatory Visit: Payer: Self-pay | Admitting: Neurology

## 2019-12-22 DIAGNOSIS — F445 Conversion disorder with seizures or convulsions: Secondary | ICD-10-CM

## 2019-12-23 ENCOUNTER — Telehealth: Payer: Self-pay | Admitting: *Deleted

## 2019-12-23 NOTE — Telephone Encounter (Signed)
Received vm message from pt requesting a refill. She did not specify which medication.  Attempted call to patient. No answer but was able to leave vm message for her to call back @ [phone]

## 2019-12-26 ENCOUNTER — Telehealth: Payer: Self-pay | Admitting: *Deleted

## 2019-12-26 ENCOUNTER — Other Ambulatory Visit: Payer: Self-pay | Admitting: Internal Medicine

## 2019-12-26 DIAGNOSIS — F445 Conversion disorder with seizures or convulsions: Secondary | ICD-10-CM

## 2019-12-26 MED ORDER — LAMOTRIGINE 150 MG PO TABS: 150.0000 mg | ORAL_TABLET | Freq: Two times a day (BID) | ORAL | 5 refills | Status: AC

## 2019-12-26 MED ORDER — LEVETIRACETAM 1000 MG PO TABS: ORAL_TABLET | ORAL | 0 refills | Status: DC

## 2019-12-26 NOTE — Telephone Encounter (Signed)
Patient called for refill of Keppra and Lamictal.    Routed to Dr [name] to review as he was not the original prescriber.     She also wanted to know about her lab results.  Communicated with her.

## 2019-12-28 ENCOUNTER — Encounter: Payer: Self-pay | Admitting: Psychiatry

## 2019-12-28 ENCOUNTER — Other Ambulatory Visit: Payer: Self-pay

## 2019-12-28 ENCOUNTER — Telehealth (INDEPENDENT_AMBULATORY_CARE_PROVIDER_SITE_OTHER): Payer: PPO | Admitting: Psychiatry

## 2019-12-28 ENCOUNTER — Telehealth (HOSPITAL_COMMUNITY): Payer: PPO | Admitting: Psychiatry

## 2019-12-28 DIAGNOSIS — F419 Anxiety disorder, unspecified: Secondary | ICD-10-CM

## 2019-12-28 DIAGNOSIS — F330 Major depressive disorder, recurrent, mild: Secondary | ICD-10-CM | POA: Diagnosis not present

## 2019-12-28 MED ORDER — FLUOXETINE HCL 40 MG PO CAPS: 80.0000 mg | ORAL_CAPSULE | Freq: Every day | ORAL | 0 refills | Status: DC

## 2019-12-28 MED ORDER — LORAZEPAM 1 MG PO TABS: 1.0000 mg | ORAL_TABLET | Freq: Every day | ORAL | 2 refills | Status: DC | PRN

## 2019-12-30 DIAGNOSIS — M7061 Trochanteric bursitis, right hip: Secondary | ICD-10-CM | POA: Diagnosis not present

## 2019-12-30 DIAGNOSIS — M7062 Trochanteric bursitis, left hip: Secondary | ICD-10-CM | POA: Diagnosis not present

## 2019-12-30 DIAGNOSIS — Z96642 Presence of left artificial hip joint: Secondary | ICD-10-CM | POA: Diagnosis not present

## 2019-12-31 ENCOUNTER — Other Ambulatory Visit: Payer: Self-pay | Admitting: Neurology

## 2020-01-04 DIAGNOSIS — D329 Benign neoplasm of meninges, unspecified: Secondary | ICD-10-CM | POA: Diagnosis not present

## 2020-01-05 ENCOUNTER — Telehealth: Payer: Self-pay

## 2020-01-05 NOTE — Telephone Encounter (Signed)
  LAST APPOINTMENT DATE: 11/11/2019   NEXT APPOINTMENT DATE:@5 /02/2020  MEDICATION:pramipexole (MIRAPEX) 0.5 MG tablet  [place], [place] - [address]  Please advise

## 2020-01-09 ENCOUNTER — Other Ambulatory Visit: Payer: Self-pay

## 2020-01-09 MED ORDER — PRAMIPEXOLE DIHYDROCHLORIDE 0.5 MG PO TABS
0.5000 mg | ORAL_TABLET | Freq: Two times a day (BID) | ORAL | 3 refills | Status: DC
Start: 2020-01-09 — End: 2020-02-24

## 2020-01-09 NOTE — Telephone Encounter (Signed)
Refill sent to [facility] [place]  

## 2020-01-16 ENCOUNTER — Other Ambulatory Visit: Payer: Self-pay | Admitting: Radiation Therapy

## 2020-01-18 ENCOUNTER — Other Ambulatory Visit: Payer: Self-pay

## 2020-01-18 ENCOUNTER — Ambulatory Visit (HOSPITAL_COMMUNITY)
Admission: RE | Admit: 2020-01-18 | Discharge: 2020-01-18 | Disposition: A | Discharge status: Home / Self Care | Payer: Medicare HMO | Source: Ambulatory Visit | Attending: Internal Medicine | Admitting: Internal Medicine

## 2020-01-18 DIAGNOSIS — J0110 Acute frontal sinusitis, unspecified: Secondary | ICD-10-CM | POA: Diagnosis not present

## 2020-01-18 DIAGNOSIS — D329 Benign neoplasm of meninges, unspecified: Secondary | ICD-10-CM | POA: Insufficient documentation

## 2020-01-18 DIAGNOSIS — G9389 Other specified disorders of brain: Secondary | ICD-10-CM | POA: Diagnosis not present

## 2020-01-18 DIAGNOSIS — D496 Neoplasm of unspecified behavior of brain: Secondary | ICD-10-CM | POA: Diagnosis not present

## 2020-01-18 MED ORDER — GADOBUTROL 1 MMOL/ML IV SOLN
6.0000 mL | Freq: Once | INTRAVENOUS | Status: AC | PRN
  Administered 2020-01-18: 6 mL via INTRAVENOUS

## 2020-01-19 ENCOUNTER — Encounter: Payer: Self-pay | Admitting: Gastroenterology

## 2020-01-23 ENCOUNTER — Inpatient Hospital Stay: Payer: Medicare HMO | Attending: Internal Medicine | Admitting: Internal Medicine

## 2020-01-23 ENCOUNTER — Other Ambulatory Visit: Payer: Self-pay

## 2020-01-23 VITALS — BP 127/81 | HR 76 | Temp 97.6°F | Resp 18 | Ht 60.0 in | Wt 137.6 lb

## 2020-01-23 DIAGNOSIS — F445 Conversion disorder with seizures or convulsions: Secondary | ICD-10-CM | POA: Diagnosis not present

## 2020-01-23 DIAGNOSIS — D329 Benign neoplasm of meninges, unspecified: Secondary | ICD-10-CM | POA: Diagnosis not present

## 2020-01-23 DIAGNOSIS — R69 Illness, unspecified: Secondary | ICD-10-CM | POA: Diagnosis not present

## 2020-01-23 NOTE — Progress Notes (Signed)
[facility] [place] [place], [place] [address] [phone]   Interval Evaluation  Date of Service: 01/23/20 Patient Name: [name] Patient MRN: [id number] Patient DOB: [birth year] Provider: Ventura Sellers, MD  Identifying Statement:  [name] is a 55 y.o. 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 [name]  Interval History:  [name] presents today for follow up after recent MRI brain, now 3 months removed from radiation therapy.  She describes overall stability of cognitive function, fatigue, mood issues.  No recurrence of seizures since last May.  Does have some "trembling, shakiness" at times, she doses 0.5mg  ativan maybe 1x per week.  Son is coming home from prison to live with her in March, she is anxious about this.  H+P (10/10/19) Patient presents today for review of meningioma and neurologic symptoms.  She describes several months history of "side by side" double vision, worsened when looking to the left and a far distance.  This may have been accompanied by some dizziness or gait instability.  Eventually she obtained a brain MRI which demonstrated a dural based bass along left cavernous sinus, meckel's cave c/w meningioma.  Because of difficult surgical access, she underwent fractionated radiosurgery with Dr. [name], in 5 fractions which was completed on 9/20.  She describes relative improvement in visual symptoms since radiation, although at times it does recur.  She also describes 20 years history of seizures, currently managed by neurologist with Keppra and Lamictal.  No recent seizures.  Medications: Current Outpatient Medications on File Prior to Visit  Medication Sig Dispense Refill  . dexamethasone (DECADRON) 4 MG tablet Take 1 tablet (4 mg total) by mouth daily. 9 tablet 0  . [START ON 01/28/2020] FLUoxetine (PROZAC) 40 MG capsule Take 2 capsules (80  mg total) by mouth daily. 180 capsule 0  . gabapentin (NEURONTIN) 600 MG tablet TAKE 1 TABLET BY MOUTH THREE TIMES DAILY (Patient taking differently: at bedtime. ) 270 tablet 0  . lamoTRIgine (LAMICTAL) 150 MG tablet Take 1 tablet (150 mg total) by mouth 2 (two) times daily. For mood control 60 tablet 5  . levETIRAcetam (KEPPRA) 1000 MG tablet Take 1 in the morning and 1 tablet at bedtime. 180 tablet 0  . LORazepam (ATIVAN) 1 MG tablet Take 1 tablet (1 mg total) by mouth daily as needed for anxiety. 30 tablet 2  . meclizine (ANTIVERT) 25 MG tablet Take 25 mg by mouth 3 (three) times daily as needed.  (Patient not taking: Reported on 11/24/2019)    . pramipexole (MIRAPEX) 0.5 MG tablet Take 1 tablet (0.5 mg total) by mouth 2 (two) times daily. 180 tablet 3   No current facility-administered medications on file prior to visit.    Allergies:  Allergies  Allergen Reactions  . Acetaminophen Other (See Comments)    Has fatty deposits on liver  . Benadryl [Diphenhydramine Hcl] Other (See Comments)    hyperactivity and seizures  . Codeine Itching and Rash    seizures  . Dilantin [Phenytoin Sodium Extended] Other (See Comments)    Elevated LFT's  . Melatonin Other (See Comments)    seizures  . Tramadol Other (See Comments)    "causes seizures"  . Ultram [Tramadol Hcl] Other (See Comments)    Seizures  . Vimpat [Lacosamide] Other (See Comments)    Severe dizziness  . Mirtazapine Other (See Comments)    Potentiated seizure  activity  . Betadine [Povidone Iodine] Rash  . Latex Rash  . Penicillins Itching and Rash    Has patient had a PCN reaction causing immediate rash, facial/tongue/throat swelling, SOB or lightheadedness with hypotension: No Has patient had a PCN reaction causing severe rash involving mucus membranes or skin necrosis: No Has patient had a PCN reaction that required hospitalization No Has patient had a PCN reaction occurring within the last 10 years: No If all of the above  answers are "NO", then may proceed with Cephalosporin use.  . Sulfa Antibiotics Rash  . Tape Rash and Other (See Comments)    Paper tape please Paper tape please  . Vicodin [Hydrocodone-Acetaminophen] Itching   Past Medical History:  Past Medical History:  Diagnosis Date  . Arthritis    "knees" (04/22/2012)  . Chronic bronchitis ([place])    "yearly; when the weather changes" (04/22/2012)  . Colon polyp   . Colon polyps    adenomatous and hyperplastic-  . Depression   . Eczema   . Epilepsy ([place])    "been having them right often here lately" (04/22/2012)  . Fatty liver   . High cholesterol   . History of kidney stones   . Osteoarthritis    [name] 04/22/2012  . Other convulsions 05/21/12   non-epileptic spells  . Restless leg   . Seizures ([place])   . Vertigo    Past Surgical History:  Past Surgical History:  Procedure Laterality Date  . ABDOMINAL HYSTERECTOMY  2001  . BLADDER SUSPENSION    . BUNIONECTOMY Left 2000  . CESAREAN SECTION  1987; 1988  . COLONOSCOPY    . JOINT REPLACEMENT    . MASS EXCISION  10/22/2011   Procedure: EXCISION MASS;  Surgeon: [name], MD;  Location: [facility];  Service: General;  Laterality: Right;  excision right buttock mass  . TOTAL HIP ARTHROPLASTY Left 1993; 1995; 2000  . TOTAL KNEE ARTHROPLASTY Left 05/07/2015   Procedure: TOTAL LEFT KNEE ARTHROPLASTY;  Surgeon: [name], MD;  Location: [facility];  Service: Orthopedics;  Laterality: Left;  . TOTAL KNEE ARTHROPLASTY Right 10/01/2015   Procedure: RIGHT TOTAL KNEE ARTHROPLASTY;  Surgeon: [name], MD;  Location: [facility];  Service: Orthopedics;  Laterality: Right;   Social History:  Social History   Socioeconomic History  . Marital status: Widowed    Spouse name: [name]  . Number of children: 2  . Years of education: 12 +  . Highest education level: Not on file  Occupational History  . Occupation: disability, medical  Tobacco Use  . Smoking status: Never Smoker  . Smokeless tobacco:  Never Used  Vaping Use  . Vaping Use: Never used  Substance and Sexual Activity  . Alcohol use: No    Alcohol/week: 0.0 standard drinks    Comment: 12-25-2015 per pt no  . Drug use: No    Comment: 21-12-2015 per pt no   . Sexual activity: Not Currently    Birth control/protection: Surgical  Other Topics Concern  . Not on file  Social History Narrative   Patient has two adult children.   Patient is disabled.   Patient has a high school education and some trade school.   Patient is right-handed.   Patient drinks four glasses of soda and tea daily.   Social Determinants of Health   Financial Resource Strain: Not on file  Food Insecurity: No Food Insecurity  . Worried About Charity fundraiser in the Last Year: Never true  .  Ran Out of Food in the Last Year: Never true  Transportation Needs: No Transportation Needs  . Lack of Transportation (Medical): No  . Lack of Transportation (Non-Medical): No  Physical Activity: Not on file  Stress: Not on file  Social Connections: Not on file  Intimate Partner Violence: Not on file   Family History:  Family History  Problem Relation Age of Onset  . Cancer Mother   . Depression Father   . Cancer Brother   . Diabetes Brother   . Cancer Maternal Aunt   . Diabetes Maternal Aunt   . Bipolar disorder Son   . Drug abuse Son   . Heart attack Sister 47       during severe illness    Review of Systems: Constitutional: Doesn't report fevers, chills or abnormal weight loss Eyes: Doesn't report blurriness of vision Ears, nose, mouth, throat, and face: Doesn't report sore throat Respiratory: Doesn't report cough, dyspnea or wheezes Cardiovascular: Doesn't report palpitation, chest discomfort  Gastrointestinal:  Doesn't report nausea, constipation, diarrhea GU: Doesn't report incontinence Skin: Doesn't report skin rashes Neurological: Per HPI Musculoskeletal: Doesn't report joint pain Behavioral/Psych: Doesn't report anxiety  Physical  Exam: Vitals:   01/23/20 1008  BP: 127/81  Pulse: 76  Resp: 18  Temp: 97.6 F (36.4 C)  SpO2: 100%   KPS: 80. General: Alert, cooperative, pleasant, in no acute distress Head: Normal EENT: No conjunctival injection or scleral icterus.  Lungs: Resp effort normal Cardiac: Regular rate Abdomen: Non-distended abdomen Skin: No rashes cyanosis or petechiae. Extremities: No clubbing or edema  Neurologic Exam: Mental Status: Awake, alert, attentive to examiner. Oriented to self and environment. Language is fluent with intact comprehension.  Cranial Nerves: Visual acuity is grossly normal. Visual fields are full. Extra-ocular movements intact, with horizontal diplopia on left gaze. No ptosis. Face is symmetric Motor: Tone and bulk are normal. Power is full in both arms and legs. Poly-mini-myoclonus and asterixis with fixed posturing. Reflexes are symmetric, no pathologic reflexes present.  Sensory: Intact to light touch Gait: Independent, wide based  Labs: I have reviewed the data as listed    Component Value Date/Time   NA 141 12/16/2019 1153   NA 144 08/23/2018 1529   K 3.9 12/16/2019 1153   CL 107 12/16/2019 1153   CO2 26 12/16/2019 1153   GLUCOSE 97 12/16/2019 1153   BUN 11 12/16/2019 1153   BUN 13 08/23/2018 1529   CREATININE 0.71 12/16/2019 1153   CREATININE 0.68 09/12/2019 1136   CALCIUM 9.4 12/16/2019 1153   PROT 6.8 12/16/2019 1153   PROT 6.6 08/23/2018 1529   ALBUMIN 3.3 (L) 12/16/2019 1153   ALBUMIN 4.5 08/23/2018 1529   AST 162 (H) 12/16/2019 1153   ALT 125 (H) 12/16/2019 1153   ALKPHOS 116 12/16/2019 1153   BILITOT 0.9 12/16/2019 1153   GFRNONAA >60 12/16/2019 1153   GFRAA >60 09/07/2019 1516   Lab Results  Component Value Date   WBC 6.4 12/16/2019   NEUTROABS 4.1 12/16/2019   HGB 11.6 (L) 12/16/2019   HCT 34.8 (L) 12/16/2019   MCV 90.4 12/16/2019   PLT 230 12/16/2019    Imaging:  [facility] Clinician Interpretation: I have personally reviewed the CNS  images as listed.  My interpretation, in the context of the patient's clinical presentation, is stable disease  MR BRAIN W WO CONTRAST  Result Date: 01/18/2020 CLINICAL DATA:  Meningioma. Brain/CNS neoplasm, assess treatment response. Additional history provided by scanning technologist: Patient reports feeling "off balance "  for several weeks. EXAM: MRI HEAD WITHOUT AND WITH CONTRAST TECHNIQUE: Multiplanar, multiecho pulse sequences of the brain and surrounding structures were obtained without and with intravenous contrast. CONTRAST:  8mL GADAVIST GADOBUTROL 1 MMOL/ML IV SOLN COMPARISON:  Prior brain MRI 09/13/2019. MRI brain/orbits 08/20/2019. FINDINGS: Brain: Mild intermittent motion degradation. Cerebral volume is normal for age. Redemonstrated avidly enhancing mass expanding the left cavernous sinus and extending inferiorly to fill Meckel's cave. As before, the dominant component of the mass within the left cavernous sinus measures 2.4 x 1.2 cm in transaxial dimensions. Also unchanged, a 0.4 x 0.6 cm pedunculated component of the tumor extends into the left prepontine cistern with slight mass effect upon the pons. Stable extension of tumor along the adjacent anterior left tentorium. There is no acute infarct. No chronic intracranial blood products. No extra-axial fluid collection. No midline shift. Vascular: Expected proximal arterial flow voids. Skull and upper cervical spine: No focal marrow lesion. Sinuses/Orbits: Visualized orbits show no acute finding. Frothy secretions within the left frontal sinus. There is severe bilateral ethmoid sinus mucosal thickening. Moderate mucosal thickening within the bilateral sphenoid sinuses. Moderate/severe mucosal thickening within the right maxillary sinus. Mild mucosal thickening within the left maxillary sinus. IMPRESSION: Mildly motion degraded exam. Redemonstrated enhancing mass consistent with meningioma centered within the left cavernous sinus and extending into  left Meckel's cave, into the left prepontine cistern and along the anterior left tentorium. The mass is stable in size as compared to the brain MRI of 09/13/2019. As before, there is minimal mass effect upon the pons. Progressive paranasal sinus disease as described. Electronically Signed   By: Kellie Simmering DO   On: 01/18/2020 10:25   Assessment/Plan [facility]) [D32.9]  [name] is clinically and radiographically stable today.  No new or progressive deficits.  We recommended continuing Keppra 1000mg  BID and Lamictal 150mg  BID as these have been effective.  We ask that [name] return to clinic in 6 months following next brain MRI, or sooner as needed.  All questions were answered. The patient knows to call the clinic with any problems, questions or concerns. No barriers to learning were detected.  The total time spent in the encounter was 30 minutes and more than 50% was on counseling and review of test results   Ventura Sellers, MD Medical Director of Neuro-Oncology [facility] at [facility] 01/23/20 10:01 AM

## 2020-01-25 ENCOUNTER — Other Ambulatory Visit: Payer: Self-pay | Admitting: Radiation Therapy

## 2020-02-08 DIAGNOSIS — R059 Cough, unspecified: Secondary | ICD-10-CM | POA: Diagnosis not present

## 2020-02-08 DIAGNOSIS — J40 Bronchitis, not specified as acute or chronic: Secondary | ICD-10-CM | POA: Diagnosis not present

## 2020-02-08 DIAGNOSIS — J029 Acute pharyngitis, unspecified: Secondary | ICD-10-CM | POA: Diagnosis not present

## 2020-02-08 DIAGNOSIS — R52 Pain, unspecified: Secondary | ICD-10-CM | POA: Diagnosis not present

## 2020-02-08 DIAGNOSIS — R0981 Nasal congestion: Secondary | ICD-10-CM | POA: Diagnosis not present

## 2020-02-12 ENCOUNTER — Other Ambulatory Visit: Payer: Self-pay

## 2020-02-12 ENCOUNTER — Emergency Department (HOSPITAL_COMMUNITY): Payer: Medicare HMO

## 2020-02-12 ENCOUNTER — Encounter (HOSPITAL_COMMUNITY): Payer: Self-pay | Admitting: Emergency Medicine

## 2020-02-12 ENCOUNTER — Inpatient Hospital Stay (HOSPITAL_COMMUNITY)
Admission: EM | Admit: 2020-02-12 | Discharge: 2020-02-14 | DRG: 194 | Disposition: A | Discharge status: Home / Self Care | Payer: Medicare HMO | Attending: Internal Medicine | Admitting: Internal Medicine

## 2020-02-12 DIAGNOSIS — R569 Unspecified convulsions: Secondary | ICD-10-CM

## 2020-02-12 DIAGNOSIS — Z888 Allergy status to other drugs, medicaments and biological substances status: Secondary | ICD-10-CM

## 2020-02-12 DIAGNOSIS — Z743 Need for continuous supervision: Secondary | ICD-10-CM | POA: Diagnosis not present

## 2020-02-12 DIAGNOSIS — F330 Major depressive disorder, recurrent, mild: Secondary | ICD-10-CM | POA: Diagnosis present

## 2020-02-12 DIAGNOSIS — Z96653 Presence of artificial knee joint, bilateral: Secondary | ICD-10-CM | POA: Diagnosis present

## 2020-02-12 DIAGNOSIS — Z882 Allergy status to sulfonamides status: Secondary | ICD-10-CM

## 2020-02-12 DIAGNOSIS — J440 Chronic obstructive pulmonary disease with acute lower respiratory infection: Secondary | ICD-10-CM | POA: Diagnosis present

## 2020-02-12 DIAGNOSIS — R059 Cough, unspecified: Secondary | ICD-10-CM | POA: Diagnosis not present

## 2020-02-12 DIAGNOSIS — R2689 Other abnormalities of gait and mobility: Secondary | ICD-10-CM | POA: Diagnosis present

## 2020-02-12 DIAGNOSIS — J189 Pneumonia, unspecified organism: Principal | ICD-10-CM

## 2020-02-12 DIAGNOSIS — J9811 Atelectasis: Secondary | ICD-10-CM | POA: Diagnosis not present

## 2020-02-12 DIAGNOSIS — F32A Depression, unspecified: Secondary | ICD-10-CM | POA: Diagnosis present

## 2020-02-12 DIAGNOSIS — F445 Conversion disorder with seizures or convulsions: Secondary | ICD-10-CM | POA: Diagnosis present

## 2020-02-12 DIAGNOSIS — R42 Dizziness and giddiness: Secondary | ICD-10-CM | POA: Diagnosis present

## 2020-02-12 DIAGNOSIS — J181 Lobar pneumonia, unspecified organism: Secondary | ICD-10-CM

## 2020-02-12 DIAGNOSIS — E7800 Pure hypercholesterolemia, unspecified: Secondary | ICD-10-CM | POA: Diagnosis present

## 2020-02-12 DIAGNOSIS — R109 Unspecified abdominal pain: Secondary | ICD-10-CM | POA: Diagnosis not present

## 2020-02-12 DIAGNOSIS — D329 Benign neoplasm of meninges, unspecified: Secondary | ICD-10-CM | POA: Diagnosis present

## 2020-02-12 DIAGNOSIS — F419 Anxiety disorder, unspecified: Secondary | ICD-10-CM | POA: Diagnosis present

## 2020-02-12 DIAGNOSIS — R110 Nausea: Secondary | ICD-10-CM | POA: Diagnosis not present

## 2020-02-12 DIAGNOSIS — Z8674 Personal history of sudden cardiac arrest: Secondary | ICD-10-CM

## 2020-02-12 DIAGNOSIS — R296 Repeated falls: Secondary | ICD-10-CM | POA: Diagnosis not present

## 2020-02-12 DIAGNOSIS — R0602 Shortness of breath: Secondary | ICD-10-CM | POA: Diagnosis not present

## 2020-02-12 DIAGNOSIS — G40201 Localization-related (focal) (partial) symptomatic epilepsy and epileptic syndromes with complex partial seizures, not intractable, with status epilepticus: Secondary | ICD-10-CM | POA: Diagnosis present

## 2020-02-12 DIAGNOSIS — Z880 Allergy status to penicillin: Secondary | ICD-10-CM

## 2020-02-12 DIAGNOSIS — Z79899 Other long term (current) drug therapy: Secondary | ICD-10-CM

## 2020-02-12 DIAGNOSIS — R0902 Hypoxemia: Secondary | ICD-10-CM | POA: Diagnosis not present

## 2020-02-12 DIAGNOSIS — E7220 Disorder of urea cycle metabolism, unspecified: Secondary | ICD-10-CM | POA: Diagnosis present

## 2020-02-12 DIAGNOSIS — Z96642 Presence of left artificial hip joint: Secondary | ICD-10-CM | POA: Diagnosis present

## 2020-02-12 DIAGNOSIS — Z20822 Contact with and (suspected) exposure to covid-19: Secondary | ICD-10-CM | POA: Diagnosis present

## 2020-02-12 DIAGNOSIS — I951 Orthostatic hypotension: Secondary | ICD-10-CM | POA: Diagnosis present

## 2020-02-12 DIAGNOSIS — G3184 Mild cognitive impairment, so stated: Secondary | ICD-10-CM | POA: Diagnosis present

## 2020-02-12 DIAGNOSIS — K760 Fatty (change of) liver, not elsewhere classified: Secondary | ICD-10-CM | POA: Diagnosis present

## 2020-02-12 DIAGNOSIS — Z886 Allergy status to analgesic agent status: Secondary | ICD-10-CM

## 2020-02-12 DIAGNOSIS — R000 Tachycardia, unspecified: Secondary | ICD-10-CM | POA: Diagnosis not present

## 2020-02-12 DIAGNOSIS — Z885 Allergy status to narcotic agent status: Secondary | ICD-10-CM

## 2020-02-12 DIAGNOSIS — Z91048 Other nonmedicinal substance allergy status: Secondary | ICD-10-CM

## 2020-02-12 DIAGNOSIS — Z91040 Latex allergy status: Secondary | ICD-10-CM

## 2020-02-12 DIAGNOSIS — R0689 Other abnormalities of breathing: Secondary | ICD-10-CM | POA: Diagnosis not present

## 2020-02-12 DIAGNOSIS — R8281 Pyuria: Secondary | ICD-10-CM | POA: Diagnosis present

## 2020-02-12 LAB — URINALYSIS, ROUTINE W REFLEX MICROSCOPIC
Bacteria, UA: NONE SEEN
Bilirubin Urine: NEGATIVE
Glucose, UA: NEGATIVE mg/dL
Ketones, ur: 5 mg/dL — AB
Leukocytes,Ua: NEGATIVE
Nitrite: NEGATIVE
Protein, ur: NEGATIVE mg/dL
Specific Gravity, Urine: 1.043 — ABNORMAL HIGH (ref 1.005–1.030)
pH: 5 (ref 5.0–8.0)

## 2020-02-12 LAB — COMPREHENSIVE METABOLIC PANEL
ALT: 69 U/L — ABNORMAL HIGH (ref 0–44)
AST: 89 U/L — ABNORMAL HIGH (ref 15–41)
Albumin: 3.2 g/dL — ABNORMAL LOW (ref 3.5–5.0)
Alkaline Phosphatase: 104 U/L (ref 38–126)
Anion gap: 9 (ref 5–15)
BUN: 17 mg/dL (ref 6–20)
CO2: 23 mmol/L (ref 22–32)
Calcium: 8.7 mg/dL — ABNORMAL LOW (ref 8.9–10.3)
Chloride: 106 mmol/L (ref 98–111)
Creatinine, Ser: 0.64 mg/dL (ref 0.44–1.00)
GFR, Estimated: >60 mL/min (ref >60)
Glucose, Bld: 90 mg/dL (ref 70–99)
Potassium: 3.7 mmol/L (ref 3.5–5.1)
Sodium: 138 mmol/L (ref 135–145)
Total Bilirubin: 1.1 mg/dL (ref 0.3–1.2)
Total Protein: 6.8 g/dL (ref 6.5–8.1)

## 2020-02-12 LAB — CBC
HCT: 34.1 % — ABNORMAL LOW (ref 36.0–46.0)
Hemoglobin: 11 g/dL — ABNORMAL LOW (ref 12.0–15.0)
MCH: 30.1 pg (ref 26.0–34.0)
MCHC: 32.3 g/dL (ref 30.0–36.0)
MCV: 93.4 fL (ref 80.0–100.0)
Platelets: 253 10*3/uL (ref 150–400)
RBC: 3.65 MIL/uL — ABNORMAL LOW (ref 3.87–5.11)
RDW: 14.3 % (ref 11.5–15.5)
WBC: 10.2 10*3/uL (ref 4.0–10.5)
nRBC: 0 % (ref 0.0–0.2)

## 2020-02-12 LAB — CREATININE, SERUM
Creatinine, Ser: 0.65 mg/dL (ref 0.44–1.00)
GFR, Estimated: >60 mL/min (ref >60)

## 2020-02-12 LAB — CBC WITH DIFFERENTIAL/PLATELET
Abs Immature Granulocytes: 0.08 10*3/uL — ABNORMAL HIGH (ref 0.00–0.07)
Basophils Absolute: 0 10*3/uL (ref 0.0–0.1)
Basophils Relative: 0 %
Eosinophils Absolute: 0 10*3/uL (ref 0.0–0.5)
Eosinophils Relative: 0 %
HCT: 36.9 % (ref 36.0–46.0)
Hemoglobin: 12 g/dL (ref 12.0–15.0)
Immature Granulocytes: 1 %
Lymphocytes Relative: 18 %
Lymphs Abs: 2.1 10*3/uL (ref 0.7–4.0)
MCH: 29.7 pg (ref 26.0–34.0)
MCHC: 32.5 g/dL (ref 30.0–36.0)
MCV: 91.3 fL (ref 80.0–100.0)
Monocytes Absolute: 1 10*3/uL (ref 0.1–1.0)
Monocytes Relative: 9 %
Neutro Abs: 8.5 10*3/uL — ABNORMAL HIGH (ref 1.7–7.7)
Neutrophils Relative %: 72 %
Platelets: 314 10*3/uL (ref 150–400)
RBC: 4.04 MIL/uL (ref 3.87–5.11)
RDW: 14.1 % (ref 11.5–15.5)
WBC: 11.7 10*3/uL — ABNORMAL HIGH (ref 4.0–10.5)
nRBC: 0 % (ref 0.0–0.2)

## 2020-02-12 LAB — AMMONIA: Ammonia: 53 umol/L — ABNORMAL HIGH (ref 9–35)

## 2020-02-12 LAB — SARS CORONAVIRUS 2 BY RT PCR (HOSPITAL ORDER, PERFORMED IN ~~LOC~~ HOSPITAL LAB): SARS Coronavirus 2: NEGATIVE

## 2020-02-12 LAB — D-DIMER, QUANTITATIVE: D-Dimer, Quant: <0.27 ug/mL-FEU (ref 0.00–0.50)

## 2020-02-12 MED ORDER — PRAMIPEXOLE DIHYDROCHLORIDE 0.25 MG PO TABS
0.5000 mg | ORAL_TABLET | Freq: Once | ORAL | Status: AC
  Administered 2020-02-12: 0.5 mg via ORAL
  Filled 2020-02-12: qty 2

## 2020-02-12 MED ORDER — SODIUM CHLORIDE 0.9% FLUSH
3.0000 mL | Freq: Two times a day (BID) | INTRAVENOUS | Status: DC
  Administered 2020-02-13 – 2020-02-14 (×2): 3 mL via INTRAVENOUS

## 2020-02-12 MED ORDER — SODIUM CHLORIDE 0.9 % IV SOLN: 2.0000 g | INTRAVENOUS | Status: DC

## 2020-02-12 MED ORDER — IOHEXOL 300 MG/ML  SOLN
75.0000 mL | Freq: Once | INTRAMUSCULAR | Status: AC | PRN
  Administered 2020-02-12: 75 mL via INTRAVENOUS

## 2020-02-12 MED ORDER — SODIUM CHLORIDE 0.9 % IV SOLN: 500.0000 mg | INTRAVENOUS | Status: DC

## 2020-02-12 MED ORDER — ACETAMINOPHEN 325 MG PO TABS
650.0000 mg | ORAL_TABLET | Freq: Once | ORAL | Status: AC
  Administered 2020-02-12: 650 mg via ORAL
  Filled 2020-02-12: qty 2

## 2020-02-12 MED ORDER — SODIUM CHLORIDE 0.9 % IV SOLN: 250.0000 mL | INTRAVENOUS | Status: DC | PRN

## 2020-02-12 MED ORDER — SODIUM CHLORIDE 0.9 % IV SOLN
500.0000 mg | Freq: Once | INTRAVENOUS | Status: AC
  Administered 2020-02-12: 500 mg via INTRAVENOUS
  Filled 2020-02-12: qty 500

## 2020-02-12 MED ORDER — SODIUM CHLORIDE 0.9 % IV SOLN
1.0000 g | INTRAVENOUS | Status: DC
  Administered 2020-02-13: 1 g via INTRAVENOUS
  Filled 2020-02-12 (×2): qty 10

## 2020-02-12 MED ORDER — SODIUM CHLORIDE 0.9 % IV SOLN
500.0000 mg | INTRAVENOUS | Status: DC
  Administered 2020-02-13: 500 mg via INTRAVENOUS
  Filled 2020-02-12 (×2): qty 500

## 2020-02-12 MED ORDER — SODIUM CHLORIDE 0.9 % IV SOLN
1.0000 g | Freq: Once | INTRAVENOUS | Status: AC
  Administered 2020-02-12: 1 g via INTRAVENOUS
  Filled 2020-02-12: qty 10

## 2020-02-12 MED ORDER — MECLIZINE HCL 12.5 MG PO TABS
25.0000 mg | ORAL_TABLET | Freq: Once | ORAL | Status: AC
  Administered 2020-02-12: 25 mg via ORAL
  Filled 2020-02-12: qty 2

## 2020-02-12 MED ORDER — SODIUM CHLORIDE 0.9 % IV BOLUS
1000.0000 mL | Freq: Once | INTRAVENOUS | Status: AC
  Administered 2020-02-12: 1000 mL via INTRAVENOUS

## 2020-02-12 MED ORDER — ENOXAPARIN SODIUM 40 MG/0.4ML ~~LOC~~ SOLN
40.0000 mg | SUBCUTANEOUS | Status: DC
  Administered 2020-02-12 – 2020-02-13 (×2): 40 mg via SUBCUTANEOUS
  Filled 2020-02-12 (×3): qty 0.4

## 2020-02-12 MED ORDER — SODIUM CHLORIDE 0.9% FLUSH: 3.0000 mL | INTRAVENOUS | Status: DC | PRN

## 2020-02-12 NOTE — ED Triage Notes (Addendum)
Pt c/o a cough and has been prescribed antibiotics with a dx of bronchitis, but stopped taking them due to an upset stomach.  Pt states she has no appetite.  Pt tested Covid, Flu A/B and Strep negative on 02/08/2020 at [facility] available in care everywhere.

## 2020-02-12 NOTE — H&P (Signed)
History and Physical    [name] HCW:[id number] DOB: Dec 28, 1965 DOA: 02/12/2020  PCP: [name], MD  Patient coming from: Home  Chief Complaint: Shortness of breath  HPI: [name] is a 55 y.o. female with medical history significant of mental delay, epilepsy, recently diagnosed with a UTI in the ED here sent home however has not gotten her medicines refilled.  She really does not know if if she is got her medicine refilled her mom controls her medicines.  She has been having cough and not feeling well.  She supposedly has been on cefdinir for the last 5 days.  She also has been having frequent falls.  Patient found to have possible pneumonia on chest x-ray O2 sats are normal on room air.  She has been referred for admission for admission for pneumonia.  Also in the setting of frequent falling.   Review of Systems: As per HPI otherwise 10 point review of systems negative.   Past Medical History:  Diagnosis Date  . Arthritis    "knees" (04/22/2012)  . Chronic bronchitis ([place])    "yearly; when the weather changes" (04/22/2012)  . Colon polyp   . Colon polyps    adenomatous and hyperplastic-  . Depression   . Eczema   . Epilepsy ([place])    "been having them right often here lately" (04/22/2012)  . Fatty liver   . High cholesterol   . History of kidney stones   . Osteoarthritis    [name] 04/22/2012  . Other convulsions 05/21/12   non-epileptic spells  . Restless leg   . Seizures ([place])   . Vertigo     Past Surgical History:  Procedure Laterality Date  . ABDOMINAL HYSTERECTOMY  2001  . BLADDER SUSPENSION    . BUNIONECTOMY Left 2000  . CESAREAN SECTION  1987; 1988  . COLONOSCOPY    . JOINT REPLACEMENT    . MASS EXCISION  10/22/2011   Procedure: EXCISION MASS;  Surgeon: [name], MD;  Location: [facility];  Service: General;  Laterality: Right;  excision right buttock mass  . TOTAL HIP ARTHROPLASTY Left 1993; 1995; 2000  . TOTAL KNEE ARTHROPLASTY Left 05/07/2015    Procedure: TOTAL LEFT KNEE ARTHROPLASTY;  Surgeon: [name], MD;  Location: [facility];  Service: Orthopedics;  Laterality: Left;  . TOTAL KNEE ARTHROPLASTY Right 10/01/2015   Procedure: RIGHT TOTAL KNEE ARTHROPLASTY;  Surgeon: [name], MD;  Location: [facility];  Service: Orthopedics;  Laterality: Right;     reports that she has never smoked. She has never used smokeless tobacco. She reports that she does not drink alcohol and does not use drugs.  Allergies  Allergen Reactions  . Acetaminophen Other (See Comments)    Has fatty deposits on liver  . Benadryl [Diphenhydramine Hcl] Other (See Comments)    hyperactivity and seizures  . Codeine Itching and Rash    seizures  . Dilantin [Phenytoin Sodium Extended] Other (See Comments)    Elevated LFT's  . Melatonin Other (See Comments)    seizures  . Tramadol Other (See Comments)    "causes seizures"  . Ultram [Tramadol Hcl] Other (See Comments)    Seizures  . Vimpat [Lacosamide] Other (See Comments)    Severe dizziness  . Mirtazapine Other (See Comments)    Potentiated seizure activity  . Betadine [Povidone Iodine] Rash  . Latex Rash  . Penicillins Itching and Rash    Has patient had a PCN reaction causing immediate rash, facial/tongue/throat  swelling, SOB or lightheadedness with hypotension: No Has patient had a PCN reaction causing severe rash involving mucus membranes or skin necrosis: No Has patient had a PCN reaction that required hospitalization No Has patient had a PCN reaction occurring within the last 10 years: No If all of the above answers are "NO", then may proceed with Cephalosporin use.  . Sulfa Antibiotics Rash  . Tape Rash and Other (See Comments)    Paper tape please Paper tape please  . Vicodin [Hydrocodone-Acetaminophen] Itching    Family History  Problem Relation Age of Onset  . Cancer Mother   . Depression Father   . Cancer Brother   . Diabetes Brother   . Cancer Maternal Aunt   . Diabetes Maternal  Aunt   . Bipolar disorder Son   . Drug abuse Son   . Heart attack Sister 39       during severe illness    Prior to Admission medications   Medication Sig Start Date End Date Taking? Authorizing Provider  dexamethasone (DECADRON) 4 MG tablet Take 1 tablet (4 mg total) by mouth daily. Patient not taking: Reported on 01/23/2020 11/24/19   Ventura Sellers, MD  FLUoxetine (PROZAC) 40 MG capsule Take 2 capsules (80 mg total) by mouth daily. 01/28/20   Norman Clay, MD  gabapentin (NEURONTIN) 600 MG tablet TAKE 1 TABLET BY MOUTH THREE TIMES DAILY Patient taking differently: at bedtime. 07/05/19   [name], MD  lamoTRIgine (LAMICTAL) 150 MG tablet Take 1 tablet (150 mg total) by mouth 2 (two) times daily. For mood control 12/26/19   Ventura Sellers, MD  levETIRAcetam (KEPPRA) 1000 MG tablet Take 1 in the morning and 1 tablet at bedtime. 12/26/19   Vaslow, Acey Lav, MD  LORazepam (ATIVAN) 1 MG tablet Take 1 tablet (1 mg total) by mouth daily as needed for anxiety. 12/28/19   Norman Clay, MD  pramipexole (MIRAPEX) 0.5 MG tablet Take 1 tablet (0.5 mg total) by mouth 2 (two) times daily. 01/09/20   [name], MD    Physical Exam: Vitals:   02/12/20 1730 02/12/20 1800 02/12/20 1830 02/12/20 1900  BP: 113/77 117/77 137/88 116/75  Pulse: 88 95 97   Resp: 18 19 20 18   Temp:      TempSrc:      SpO2: 94% 93% 96%   Weight:      Height:          Constitutional: NAD, calm, comfortable Vitals:   02/12/20 1730 02/12/20 1800 02/12/20 1830 02/12/20 1900  BP: 113/77 117/77 137/88 116/75  Pulse: 88 95 97   Resp: 18 19 20 18   Temp:      TempSrc:      SpO2: 94% 93% 96%   Weight:      Height:       Eyes: PERRL, lids and conjunctivae normal ENMT: Mucous membranes are moist. Posterior pharynx clear of any exudate or lesions.Normal dentition.  Neck: normal, supple, no masses, no thyromegaly Respiratory: clear to auscultation bilaterally, no wheezing, no crackles. Normal  respiratory effort. No accessory muscle use.  Cardiovascular: Regular rate and rhythm, no murmurs / rubs / gallops. No extremity edema. 2+ pedal pulses. No carotid bruits.  Abdomen: no tenderness, no masses palpated. No hepatosplenomegaly. Bowel sounds positive.  Musculoskeletal: no clubbing / cyanosis. No joint deformity upper and lower extremities. Good ROM, no contractures. Normal muscle tone.  Skin: no rashes, lesions, ulcers. No induration Neurologic: CN 2-12 grossly intact. Sensation intact, DTR  normal. Strength 5/5 in all 4.  Psychiatric: Normal judgment and insight. Alert and oriented x 3. Normal mood.    Labs on Admission: I have personally reviewed following labs and imaging studies  CBC: Recent Labs  Lab 02/12/20 1530  WBC 11.7*  NEUTROABS 8.5*  HGB 12.0  HCT 36.9  MCV 91.3  PLT [id number]   Basic Metabolic Panel: Recent Labs  Lab 02/12/20 1530  NA 138  K 3.7  CL 106  CO2 23  GLUCOSE 90  BUN 17  CREATININE 0.64  CALCIUM 8.7*   GFR: Estimated Creatinine Clearance: 66.4 mL/min (by C-G formula based on SCr of 0.64 mg/dL). Liver Function Tests: Recent Labs  Lab 02/12/20 1530  AST 89*  ALT 69*  ALKPHOS 104  BILITOT 1.1  PROT 6.8  ALBUMIN 3.2*   No results for input(s): LIPASE, AMYLASE in the last 168 hours. Recent Labs  Lab 02/12/20 1533  AMMONIA 53*   Coagulation Profile: No results for input(s): INR, PROTIME in the last 168 hours. Cardiac Enzymes: No results for input(s): CKTOTAL, CKMB, CKMBINDEX, TROPONINI in the last 168 hours. BNP (last 3 results) No results for input(s): PROBNP in the last 8760 hours. HbA1C: No results for input(s): HGBA1C in the last 72 hours. CBG: No results for input(s): GLUCAP in the last 168 hours. Lipid Profile: No results for input(s): CHOL, HDL, LDLCALC, TRIG, CHOLHDL, LDLDIRECT in the last 72 hours. Thyroid Function Tests: No results for input(s): TSH, T4TOTAL, FREET4, T3FREE, THYROIDAB in the last 72 hours. Anemia  Panel: No results for input(s): VITAMINB12, FOLATE, FERRITIN, TIBC, IRON, RETICCTPCT in the last 72 hours. Urine analysis:    Component Value Date/Time   COLORURINE YELLOW 12/16/2019 1154   APPEARANCEUR CLEAR 12/16/2019 1154   APPEARANCEUR Clear 01/30/2017 1020   LABSPEC 1.019 12/16/2019 1154   PHURINE 6.0 12/16/2019 1154   GLUCOSEU NEGATIVE 12/16/2019 1154   HGBUR SMALL (A) 12/16/2019 1154   BILIRUBINUR NEGATIVE 12/16/2019 1154   BILIRUBINUR Negative 01/30/2017 1020   KETONESUR NEGATIVE 12/16/2019 1154   PROTEINUR NEGATIVE 12/16/2019 1154   UROBILINOGEN negative 04/19/2014 1332   UROBILINOGEN 0.2 12/04/2013 1414   NITRITE NEGATIVE 12/16/2019 1154   LEUKOCYTESUR NEGATIVE 12/16/2019 1154   Sepsis Labs: !!!!!!!!!!!!!!!!!!!!!!!!!!!!!!!!!!!!!!!!!!!! @LABRCNTIP (procalcitonin:4,lacticidven:4) ) Recent Results (from the past 240 hour(s))  SARS Coronavirus 2 by RT PCR (hospital order, performed in [facility] hospital lab) Nasopharyngeal Nasopharyngeal Swab     Status: None   Collection Time: 02/12/20  3:31 PM   Specimen: Nasopharyngeal Swab  Result Value Ref Range Status   SARS Coronavirus 2 NEGATIVE NEGATIVE Final    Comment: (NOTE) SARS-CoV-2 target nucleic acids are NOT DETECTED.  The SARS-CoV-2 RNA is generally detectable in upper and lower respiratory specimens during the acute phase of infection. The lowest concentration of SARS-CoV-2 viral copies this assay can detect is 250 copies / mL. A negative result does not preclude SARS-CoV-2 infection and should not be used as the sole basis for treatment or other patient management decisions.  A negative result may occur with improper specimen collection / handling, submission of specimen other than nasopharyngeal swab, presence of viral mutation(s) within the areas targeted by this assay, and inadequate number of viral copies (<250 copies / mL). A negative result must be combined with clinical observations, patient history, and  epidemiological information.  Fact Sheet for Patients:   StrictlyIdeas.no  Fact Sheet for Healthcare Providers: BankingDealers.co.za  This test is not yet approved or  cleared by the [place] FDA and  has been authorized for detection and/or diagnosis of SARS-CoV-2 by FDA under an Emergency Use Authorization (EUA).  This EUA will remain in effect (meaning this test can be used) for the duration of the COVID-19 declaration under Section 564(b)(1) of the Act, 21 U.S.C. section 360bbb-3(b)(1), unless the authorization is terminated or revoked sooner.  Performed at [facility], [address], [place], [place] [address]      Radiological Exams on Admission: CT Head Wo Contrast  Result Date: 02/12/2020 CLINICAL DATA:  Head trauma, fell yesterday striking head, loss of consciousness, complaining of dizziness, history of seizures EXAM: CT HEAD WITHOUT CONTRAST TECHNIQUE: Contiguous axial images were obtained from the base of the skull through the vertex without intravenous contrast. COMPARISON:  06/22/2019 FINDINGS: Brain: Normal ventricular morphology. No midline shift or mass effect. Normal appearance of brain parenchyma. No intracranial hemorrhage, mass lesion, or evidence of acute infarction. No extra-axial fluid collections. Vascular: No hyperdense vessels. Minimal atherosclerotic calcification within internal carotid arteries at skull base Skull: Intact Sinuses/Orbits: Opacified RIGHT middle ear cavity. Mucosal thickening throughout the paranasal sinuses with small fluid levels in the maxillary and frontal sinuses. Other: N/A IMPRESSION: No acute intracranial abnormalities. Scattered sinus disease changes as above. Opacified RIGHT middle ear cavity new since previous exam, recommend correlation with physical exam. Electronically Signed   By: Lavonia Dana M.D.   On: 02/12/2020 16:15   CT ABDOMEN PELVIS W CONTRAST  Result Date:  02/12/2020 CLINICAL DATA:  Abdominal trauma after fall, flank ecchymosis. Patient reports chronic lower abdominal pain. EXAM: CT ABDOMEN AND PELVIS WITH CONTRAST TECHNIQUE: Multidetector CT imaging of the abdomen and pelvis was performed using the standard protocol following bolus administration of intravenous contrast. CONTRAST:  66mL OMNIPAQUE IOHEXOL 300 MG/ML  SOLN COMPARISON:  Noncontrast CT 11/29/2017 FINDINGS: Lower chest: Right lower lobe consolidation with internal air bronchograms, series 4, image 7. Dependent opacity in the left lower lobe. Upper normal heart size. Hepatobiliary: Mild diffusely decreased hepatic density consistent with steatosis. No focal hepatic abnormality. No evidence of hepatic injury or perihepatic hematoma. Gallbladder physiologically distended, no calcified stone. No biliary dilatation. Pancreas: No acute finding. No ductal dilatation. Stable fullness in the pancreatic head. No discrete pancreatic mass. Spleen: Normal in size. No focal abnormality. No evidence of splenic injury or perisplenic hematoma. Adrenals/Urinary Tract: No adrenal nodule or hemorrhage. No renal injury. No hydronephrosis. Bilateral nonobstructing renal calculi. No focal renal lesion. Unremarkable urinary bladder. Stomach/Bowel: Decompressed stomach, motion through the upper abdomen limits detailed assessment. No bowel obstruction, inflammation, or evident wall thickening. No evidence of bowel injury. Moderately large stool burden throughout the colon. Low lying cecum in the deep pelvis. Appendix not confidently visualized. No appendicitis. No mesenteric hematoma. Vascular/Lymphatic: Aortic and branch atherosclerosis. No acute vascular findings are vascular injury. No retroperitoneal fluid. No enlarged lymph nodes in the abdomen or pelvis. Probable calcified node abutting the ascending colon. Reproductive: Hysterectomy. 2.7 cm left ovarian/adnexal cyst. This is not significantly changed from prior exam. Other:  No free air or free fluid. No confluent soft tissue hematoma. Musculoskeletal: Left hip arthroplasty. No acute fracture of the pelvis or lumbar spine. Multilevel lumbar spondylosis. IMPRESSION: 1. No acute traumatic injury to the abdomen or pelvis. 2. Right lower lobe consolidation with internal air bronchograms, suspicious for pneumonia. Dependent opacity in the left lower lobe favors atelectasis. 3. Bilateral nonobstructing renal calculi. 4. Mild hepatic steatosis. 5. Stable 2.7 cm left ovarian/adnexal cyst. No follow-up imaging recommended. Note: This recommendation does not apply to premenarchal patients  and to those with increased risk (genetic, family history, elevated tumor markers or other high-risk factors) of ovarian cancer. Reference: JACR 2020 Feb; 17(2):248-254 Aortic Atherosclerosis (ICD10-I70.0). Electronically Signed   By: Keith Rake M.D.   On: 02/12/2020 17:36   DG Chest Portable 1 View  Result Date: 02/12/2020 CLINICAL DATA:  Cough and shortness of breath EXAM: PORTABLE CHEST 1 VIEW COMPARISON:  03/03/2017 FINDINGS: Linear bandlike density above the right hemidiaphragm favors atelectasis given the configuration, less likely to be related to pneumonia. Old healed fracture of the right lateral seventh rib. Thoracic spondylosis. Heart size within normal limits for projection. The left lung appears clear. No blunting of the costophrenic angles. IMPRESSION: 1. Linear bandlike density above the right hemidiaphragm favors atelectasis over early pneumonia. Electronically Signed   By: Van Clines M.D.   On: 02/12/2020 15:59   Old chart reviewed Case discussed with EDP Chest x-ray reviewed no clear infiltrate or edema  Assessment/Plan  55 year old female with community-acquired pneumonia possibly has been on cefdinir orally at home  Principal Problem:    CAP (community acquired pneumonia)-vital signs stable O2 sats normal.  Placed on Rocephin and azithromycin.  Covid screen is  pending.  Active Problems:    Mild neurocognitive disorder-noted    Anxiety-noted    Seizure (HCC)-placed on seizure precautions.  Clarify and resume seizure medications  Vertigo-given meclizine in the ED.  Also given IV fluids.  No focal neurological deficits.  If persists or neurological symptoms worsens consider MRI.  Obtain physical therapy evaluation.    Further recommendations pending on over hospital course   DVT prophylaxis: SCDs Code Status: Full Family Communication: None Disposition Plan: 1 to 2 days Consults called: None Admission status: Observation   Yerick Eggebrecht A MD [facility]  If 7PM-7AM, please contact night-coverage [email or web address] Password [facility]  02/12/2020, 7:29 PM

## 2020-02-12 NOTE — ED Provider Notes (Signed)
[name] EMERGENCY DEPARTMENT Provider Note   CSN: [id number] Arrival date & time: 02/12/20  1238     History Chief Complaint  Patient presents with  . Cough    [name] is a 55 y.o. female.  HPI   55 y/o female with a h/o chronic bronchitis, depression, epilepsy, HLD, fatty liver, nephrolithiasis, vertigo, who presents to the ED today for eval of for evaluation of multiple complaints. Patient states that she has had URI symptoms for the last week. She is had a cough, shortness of breath and some pleuritic pain as well. She is also had some sweats, chills and fevers. She has had some episodes of vomiting at home. Denies any diarrhea or abdominal pain. Additionally she states that she has had multiple falls over the last several days. Yesterday she fell 6 times injuring her right mid back, left hip and left arm area. She reports head trauma following these falls and has had a headache. Has history of meningioma with intermittent blurred vision which she states is present currently. She also feels like she is lightheaded and dizzy and has to hold onto things when she walks.  Past Medical History:  Diagnosis Date  . Arthritis    "knees" (04/22/2012)  . Chronic bronchitis ([place])    "yearly; when the weather changes" (04/22/2012)  . Colon polyp   . Colon polyps    adenomatous and hyperplastic-  . Depression   . Eczema   . Epilepsy ([place])    "been having them right often here lately" (04/22/2012)  . Fatty liver   . High cholesterol   . History of kidney stones   . Osteoarthritis    [name] 04/22/2012  . Other convulsions 05/21/12   non-epileptic spells  . Restless leg   . Seizures ([place])   . Vertigo     Patient Active Problem List   Diagnosis Date Noted  . Meningioma ([place]) 09/12/2019  . Benign neoplasm of cerebral meninges ([place]) 09/09/2019  . Anxiety 12/30/2018  . Cardiac arrest ([place]) 12/15/2018  . MDD (major depressive disorder), recurrent episode, mild ([place]) 01/08/2018   . Widowed - July 2019 08/07/2017  . Sleep apnea 06/09/2017  . Colon polyp 06/05/2017  . Elevated liver function tests 06/05/2017  . Fatty liver   . Mild neurocognitive disorder 02/04/2017  . 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 Partial symptomatic epilepsy with complex partial seizures, not intractable, with status epilepticus ([place]) 07/24/2015  . OA (osteoarthritis) of knee 05/07/2015  . 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 ABDOMINAL HYSTERECTOMY  2001  . BLADDER SUSPENSION    . BUNIONECTOMY Left 2000  . CESAREAN SECTION  1987; 1988  . COLONOSCOPY    . JOINT REPLACEMENT    . MASS EXCISION  10/22/2011   Procedure: EXCISION MASS;  Surgeon: [name], MD;  Location: [facility];  Service: General;  Laterality: Right;  excision right buttock mass  . TOTAL HIP ARTHROPLASTY Left 1993; 1995; 2000  . TOTAL KNEE ARTHROPLASTY Left 05/07/2015   Procedure: TOTAL LEFT KNEE ARTHROPLASTY;  Surgeon: [name], MD;  Location: [facility];  Service: Orthopedics;  Laterality: Left;  . TOTAL KNEE ARTHROPLASTY Right 10/01/2015   Procedure: RIGHT TOTAL KNEE ARTHROPLASTY;  Surgeon: [name], MD;  Location: [facility];  Service: Orthopedics;  Laterality: Right;     OB History   No obstetric history on file.     Family History  Problem Relation Age of Onset  .  Cancer Mother   . Depression Father   . Cancer Brother   . Diabetes Brother   . Cancer Maternal Aunt   . Diabetes Maternal Aunt   . Bipolar disorder Son   . Drug abuse Son   . Heart attack Sister 92       during severe illness    Social History   Tobacco Use  . Smoking status: Never Smoker  . Smokeless tobacco: Never Used  Vaping Use  . Vaping Use: Never used  Substance Use Topics  . Alcohol use: No    Alcohol/week: 0.0 standard drinks    Comment: 12-25-2015 per pt no  . Drug use: No    Comment: 21-12-2015 per pt no     Home Medications Prior to Admission medications    Medication Sig Start Date End Date Taking? Authorizing Provider  dexamethasone (DECADRON) 4 MG tablet Take 1 tablet (4 mg total) by mouth daily. Patient not taking: Reported on 01/23/2020 11/24/19   Ventura Sellers, MD  FLUoxetine (PROZAC) 40 MG capsule Take 2 capsules (80 mg total) by mouth daily. 01/28/20   Norman Clay, MD  gabapentin (NEURONTIN) 600 MG tablet TAKE 1 TABLET BY MOUTH THREE TIMES DAILY Patient taking differently: at bedtime. 07/05/19   [name], MD  lamoTRIgine (LAMICTAL) 150 MG tablet Take 1 tablet (150 mg total) by mouth 2 (two) times daily. For mood control 12/26/19   Ventura Sellers, MD  levETIRAcetam (KEPPRA) 1000 MG tablet Take 1 in the morning and 1 tablet at bedtime. 12/26/19   Vaslow, Acey Lav, MD  LORazepam (ATIVAN) 1 MG tablet Take 1 tablet (1 mg total) by mouth daily as needed for anxiety. 12/28/19   Norman Clay, MD  pramipexole (MIRAPEX) 0.5 MG tablet Take 1 tablet (0.5 mg total) by mouth 2 (two) times daily. 01/09/20   [name], MD    Allergies    Acetaminophen, Benadryl [diphenhydramine hcl], Codeine, Dilantin [phenytoin sodium extended], Melatonin, Tramadol, Ultram [tramadol hcl], Vimpat [lacosamide], Mirtazapine, Betadine [povidone iodine], Latex, Penicillins, Sulfa antibiotics, Tape, and Vicodin [hydrocodone-acetaminophen]  Review of Systems   Review of Systems  Constitutional: Positive for chills, diaphoresis and fever.  HENT: Negative for ear pain and sore throat.   Eyes: Negative for visual disturbance.  Respiratory: Positive for cough and shortness of breath.   Cardiovascular: Negative for chest pain.  Gastrointestinal: Positive for nausea and vomiting. Negative for abdominal pain.  Genitourinary: Negative for dysuria and hematuria.  Musculoskeletal: Positive for back pain.       Left hip pain  Skin: Negative for rash.  Neurological: Positive for dizziness, light-headedness and headaches. Negative for weakness and numbness.  All  other systems reviewed and are negative.   Physical Exam Updated Vital Signs BP 117/77   Pulse 95   Temp 99.6 F (37.6 C) (Rectal)   Resp 19   Ht 5' (1.524 m)   Wt 62.4 kg   SpO2 93%   BMI 26.87 kg/m   Physical Exam Vitals and nursing note reviewed.  Constitutional:      General: She is not in acute distress.    Appearance: She is well-developed and well-nourished.  HENT:     Head: Normocephalic and atraumatic.     Mouth/Throat:     Mouth: Mucous membranes are dry.  Eyes:     Extraocular Movements: Extraocular movements intact.     Conjunctiva/sclera: Conjunctivae normal.     Pupils: Pupils are equal, round, and reactive to light.  Cardiovascular:  Rate and Rhythm: Regular rhythm. Tachycardia present.     Heart sounds: Normal heart sounds. No murmur heard.     Comments: Marginally tachycardic Pulmonary:     Effort: Pulmonary effort is normal. No respiratory distress.     Breath sounds: Rhonchi and rales present.     Comments: tachypneic Abdominal:     General: Bowel sounds are normal.     Palpations: Abdomen is soft.     Tenderness: There is no abdominal tenderness. There is no guarding or rebound.  Musculoskeletal:        General: No edema.     Cervical back: Neck supple.  Skin:    General: Skin is warm and dry.  Neurological:     Mental Status: She is alert.     Comments: Mental Status:  Alert, thought content appropriate, able to give a coherent history. Speech fluent without evidence of aphasia. Able to follow 2 step commands without difficulty.  Cranial Nerves:  II:   pupils equal, round, reactive to light III,IV, VI: ptosis not present, extra-ocular motions intact bilaterally  V,VII: smile symmetric, facial light touch sensation equal VIII: hearing grossly normal to voice  X: uvula elevates symmetrically  XI: bilateral shoulder shrug symmetric and strong XII: midline tongue extension without fassiculations Motor:  Normal tone. 5/5 strength of BUE  and BLE major muscle groups including strong and equal grip strength and dorsiflexion/plantar flexion Sensory: light touch normal in all extremities. Cerebellar: dysmetria with finger to nose bilaterally Gait: unable to ambulate without 2 person assist due to severe ataxia +romberg   Psychiatric:        Mood and Affect: Mood and affect normal.     Comments: anxious     ED Results / Procedures / Treatments   Labs (all labs ordered are listed, but only abnormal results are displayed) Labs Reviewed  CBC WITH DIFFERENTIAL/PLATELET - Abnormal; Notable for the following components:      Result Value   WBC 11.7 (*)    Neutro Abs 8.5 (*)    Abs Immature Granulocytes 0.08 (*)    All other components within normal limits  COMPREHENSIVE METABOLIC PANEL - Abnormal; Notable for the following components:   Calcium 8.7 (*)    Albumin 3.2 (*)    AST 89 (*)    ALT 69 (*)    All other components within normal limits  AMMONIA - Abnormal; Notable for the following components:   Ammonia 53 (*)    All other components within normal limits  SARS CORONAVIRUS 2 BY RT PCR (HOSPITAL ORDER, [facility])  D-DIMER, QUANTITATIVE (NOT AT [facility])  URINALYSIS, ROUTINE W REFLEX MICROSCOPIC    EKG EKG Interpretation  Date/Time:  Sunday February 12 2020 15:51:53 EST Ventricular Rate:  96 PR Interval:    QRS Duration: 98 QT Interval:  397 QTC Calculation: 502 R Axis:   45 Text Interpretation: Sinus rhythm Borderline T abnormalities, anterior leads Borderline prolonged QT interval Artifact in lead(s) I II aVR aVL Confirmed by Noemi Chapel [id number]) on 02/12/2020 4:34:59 PM   Radiology CT Head Wo Contrast  Result Date: 02/12/2020 CLINICAL DATA:  Head trauma, fell yesterday striking head, loss of consciousness, complaining of dizziness, history of seizures EXAM: CT HEAD WITHOUT CONTRAST TECHNIQUE: Contiguous axial images were obtained from the base of the skull through the vertex  without intravenous contrast. COMPARISON:  06/22/2019 FINDINGS: Brain: Normal ventricular morphology. No midline shift or mass effect. Normal appearance of brain parenchyma. No intracranial  hemorrhage, mass lesion, or evidence of acute infarction. No extra-axial fluid collections. Vascular: No hyperdense vessels. Minimal atherosclerotic calcification within internal carotid arteries at skull base Skull: Intact Sinuses/Orbits: Opacified RIGHT middle ear cavity. Mucosal thickening throughout the paranasal sinuses with small fluid levels in the maxillary and frontal sinuses. Other: N/A IMPRESSION: No acute intracranial abnormalities. Scattered sinus disease changes as above. Opacified RIGHT middle ear cavity new since previous exam, recommend correlation with physical exam. Electronically Signed   By: Lavonia Dana M.D.   On: 02/12/2020 16:15   CT ABDOMEN PELVIS W CONTRAST  Result Date: 02/12/2020 CLINICAL DATA:  Abdominal trauma after fall, flank ecchymosis. Patient reports chronic lower abdominal pain. EXAM: CT ABDOMEN AND PELVIS WITH CONTRAST TECHNIQUE: Multidetector CT imaging of the abdomen and pelvis was performed using the standard protocol following bolus administration of intravenous contrast. CONTRAST:  95mL OMNIPAQUE IOHEXOL 300 MG/ML  SOLN COMPARISON:  Noncontrast CT 11/29/2017 FINDINGS: Lower chest: Right lower lobe consolidation with internal air bronchograms, series 4, image 7. Dependent opacity in the left lower lobe. Upper normal heart size. Hepatobiliary: Mild diffusely decreased hepatic density consistent with steatosis. No focal hepatic abnormality. No evidence of hepatic injury or perihepatic hematoma. Gallbladder physiologically distended, no calcified stone. No biliary dilatation. Pancreas: No acute finding. No ductal dilatation. Stable fullness in the pancreatic head. No discrete pancreatic mass. Spleen: Normal in size. No focal abnormality. No evidence of splenic injury or perisplenic  hematoma. Adrenals/Urinary Tract: No adrenal nodule or hemorrhage. No renal injury. No hydronephrosis. Bilateral nonobstructing renal calculi. No focal renal lesion. Unremarkable urinary bladder. Stomach/Bowel: Decompressed stomach, motion through the upper abdomen limits detailed assessment. No bowel obstruction, inflammation, or evident wall thickening. No evidence of bowel injury. Moderately large stool burden throughout the colon. Low lying cecum in the deep pelvis. Appendix not confidently visualized. No appendicitis. No mesenteric hematoma. Vascular/Lymphatic: Aortic and branch atherosclerosis. No acute vascular findings are vascular injury. No retroperitoneal fluid. No enlarged lymph nodes in the abdomen or pelvis. Probable calcified node abutting the ascending colon. Reproductive: Hysterectomy. 2.7 cm left ovarian/adnexal cyst. This is not significantly changed from prior exam. Other: No free air or free fluid. No confluent soft tissue hematoma. Musculoskeletal: Left hip arthroplasty. No acute fracture of the pelvis or lumbar spine. Multilevel lumbar spondylosis. IMPRESSION: 1. No acute traumatic injury to the abdomen or pelvis. 2. Right lower lobe consolidation with internal air bronchograms, suspicious for pneumonia. Dependent opacity in the left lower lobe favors atelectasis. 3. Bilateral nonobstructing renal calculi. 4. Mild hepatic steatosis. 5. Stable 2.7 cm left ovarian/adnexal cyst. No follow-up imaging recommended. Note: This recommendation does not apply to premenarchal patients and to those with increased risk (genetic, family history, elevated tumor markers or other high-risk factors) of ovarian cancer. Reference: JACR 2020 Feb; 17(2):248-254 Aortic Atherosclerosis (ICD10-I70.0). Electronically Signed   By: Keith Rake M.D.   On: 02/12/2020 17:36   DG Chest Portable 1 View  Result Date: 02/12/2020 CLINICAL DATA:  Cough and shortness of breath EXAM: PORTABLE CHEST 1 VIEW COMPARISON:   03/03/2017 FINDINGS: Linear bandlike density above the right hemidiaphragm favors atelectasis given the configuration, less likely to be related to pneumonia. Old healed fracture of the right lateral seventh rib. Thoracic spondylosis. Heart size within normal limits for projection. The left lung appears clear. No blunting of the costophrenic angles. IMPRESSION: 1. Linear bandlike density above the right hemidiaphragm favors atelectasis over early pneumonia. Electronically Signed   By: Van Clines M.D.   On: 02/12/2020 15:59  Procedures Procedures   Medications Ordered in ED Medications  cefTRIAXone (ROCEPHIN) 1 g in sodium chloride 0.9 % 100 mL IVPB (1 g Intravenous New Bag/Given 02/12/20 1831)  azithromycin (ZITHROMAX) 500 mg in sodium chloride 0.9 % 250 mL IVPB (has no administration in time range)  sodium chloride 0.9 % bolus 1,000 mL (0 mLs Intravenous Stopped 02/12/20 1636)  acetaminophen (TYLENOL) tablet 650 mg (650 mg Oral Given 02/12/20 1636)  pramipexole (MIRAPEX) tablet 0.5 mg (0.5 mg Oral Given 02/12/20 1636)  iohexol (OMNIPAQUE) 300 MG/ML solution 75 mL (75 mLs Intravenous Contrast Given 02/12/20 1708)  meclizine (ANTIVERT) tablet 25 mg (25 mg Oral Given 02/12/20 1828)    ED Course  I have reviewed the triage vital signs and the nursing notes.  Pertinent labs & imaging results that were available during my care of the patient were reviewed by me and considered in my medical decision making (see chart for details).    MDM Rules/Calculators/A&P                          55 y/o F presenting to the ED today for mult complaints. Here for URI sxs and also mult falls.   Reviewed/interpreted labs Cbc with mild leukocytosis, no anemia CMP with mildly elevated lfts Ammonia marginally elevated ddimer neg making pe less likely covid is negative ua pending at the time of admission   EKG - Sinus rhythm Borderline T abnormalities, anterior leads Borderline prolonged QT interval  Artifact in lead(s) I II aVR aVL  Reviewed/interpreted imaging  CXR - 1. Linear bandlike density above the right hemidiaphragm favors atelectasis over early pneumonia. CT head  -  No acute intracranial abnormalities. Scattered sinus disease changes as above. Opacified RIGHT middle ear cavity new since previous exam, recommend correlation with physical exam. CT abd/pelvis - 1. No acute traumatic injury to the abdomen or pelvis. 2. Right lower lobe consolidation with internal air bronchograms, suspicious for pneumonia. Dependent opacity in the left lower lobe favors atelectasis. 3. Bilateral nonobstructing renal calculi. 4. Mild hepatic steatosis. 5. Stable 2.7 cm left ovarian/adnexal cyst.   Pt with persistent pna with worsening sxs despite outpt abx. Also with new generalized weakness ataxia and multiple falls. CT head neg but may need further w/u as an inpatient as MRI not available at this time. Will admit for further tx  6:45 PM CONSULT with Dr. Shanon Brow who accepts patient for admission   Final Clinical Impression(s) / ED Diagnoses Final diagnoses:  Community acquired pneumonia, unspecified laterality  Multiple falls    Rx / DC Orders ED Discharge Orders    None       Bishop Dublin 02/12/20 1846    Noemi Chapel, MD 02/15/20 1517

## 2020-02-12 NOTE — ED Notes (Signed)
Attempted to ambulate pt. Pt very unsteady with shuffling gait and appears off balance. When attempting to have pt stand still with eyes closed, pt begins to fall backwards. PA at bedside during evaluation.

## 2020-02-13 ENCOUNTER — Encounter (HOSPITAL_COMMUNITY): Payer: Self-pay | Admitting: Family Medicine

## 2020-02-13 ENCOUNTER — Other Ambulatory Visit: Payer: Self-pay

## 2020-02-13 DIAGNOSIS — J181 Lobar pneumonia, unspecified organism: Secondary | ICD-10-CM

## 2020-02-13 DIAGNOSIS — J440 Chronic obstructive pulmonary disease with acute lower respiratory infection: Secondary | ICD-10-CM | POA: Diagnosis not present

## 2020-02-13 DIAGNOSIS — F330 Major depressive disorder, recurrent, mild: Secondary | ICD-10-CM | POA: Diagnosis not present

## 2020-02-13 DIAGNOSIS — D329 Benign neoplasm of meninges, unspecified: Secondary | ICD-10-CM | POA: Diagnosis present

## 2020-02-13 DIAGNOSIS — Z96653 Presence of artificial knee joint, bilateral: Secondary | ICD-10-CM | POA: Diagnosis present

## 2020-02-13 DIAGNOSIS — Z20822 Contact with and (suspected) exposure to covid-19: Secondary | ICD-10-CM | POA: Diagnosis not present

## 2020-02-13 DIAGNOSIS — E7220 Disorder of urea cycle metabolism, unspecified: Secondary | ICD-10-CM | POA: Diagnosis not present

## 2020-02-13 DIAGNOSIS — Z885 Allergy status to narcotic agent status: Secondary | ICD-10-CM | POA: Diagnosis not present

## 2020-02-13 DIAGNOSIS — R569 Unspecified convulsions: Secondary | ICD-10-CM

## 2020-02-13 DIAGNOSIS — G3184 Mild cognitive impairment, so stated: Secondary | ICD-10-CM

## 2020-02-13 DIAGNOSIS — F419 Anxiety disorder, unspecified: Secondary | ICD-10-CM | POA: Diagnosis present

## 2020-02-13 DIAGNOSIS — R8281 Pyuria: Secondary | ICD-10-CM | POA: Diagnosis present

## 2020-02-13 DIAGNOSIS — Z886 Allergy status to analgesic agent status: Secondary | ICD-10-CM | POA: Diagnosis not present

## 2020-02-13 DIAGNOSIS — J189 Pneumonia, unspecified organism: Secondary | ICD-10-CM | POA: Diagnosis not present

## 2020-02-13 DIAGNOSIS — I951 Orthostatic hypotension: Secondary | ICD-10-CM | POA: Diagnosis present

## 2020-02-13 DIAGNOSIS — G40201 Localization-related (focal) (partial) symptomatic epilepsy and epileptic syndromes with complex partial seizures, not intractable, with status epilepticus: Secondary | ICD-10-CM | POA: Diagnosis not present

## 2020-02-13 DIAGNOSIS — Z79899 Other long term (current) drug therapy: Secondary | ICD-10-CM | POA: Diagnosis not present

## 2020-02-13 DIAGNOSIS — Z8674 Personal history of sudden cardiac arrest: Secondary | ICD-10-CM | POA: Diagnosis not present

## 2020-02-13 DIAGNOSIS — Z888 Allergy status to other drugs, medicaments and biological substances status: Secondary | ICD-10-CM | POA: Diagnosis not present

## 2020-02-13 DIAGNOSIS — F445 Conversion disorder with seizures or convulsions: Secondary | ICD-10-CM | POA: Diagnosis present

## 2020-02-13 DIAGNOSIS — K760 Fatty (change of) liver, not elsewhere classified: Secondary | ICD-10-CM | POA: Diagnosis not present

## 2020-02-13 DIAGNOSIS — R2689 Other abnormalities of gait and mobility: Secondary | ICD-10-CM | POA: Diagnosis not present

## 2020-02-13 DIAGNOSIS — F32A Depression, unspecified: Secondary | ICD-10-CM | POA: Diagnosis present

## 2020-02-13 DIAGNOSIS — E7800 Pure hypercholesterolemia, unspecified: Secondary | ICD-10-CM | POA: Diagnosis present

## 2020-02-13 DIAGNOSIS — R296 Repeated falls: Secondary | ICD-10-CM | POA: Diagnosis not present

## 2020-02-13 DIAGNOSIS — Z880 Allergy status to penicillin: Secondary | ICD-10-CM | POA: Diagnosis not present

## 2020-02-13 DIAGNOSIS — R69 Illness, unspecified: Secondary | ICD-10-CM | POA: Diagnosis not present

## 2020-02-13 DIAGNOSIS — Z91040 Latex allergy status: Secondary | ICD-10-CM | POA: Diagnosis not present

## 2020-02-13 DIAGNOSIS — Z882 Allergy status to sulfonamides status: Secondary | ICD-10-CM | POA: Diagnosis not present

## 2020-02-13 DIAGNOSIS — Z91048 Other nonmedicinal substance allergy status: Secondary | ICD-10-CM | POA: Diagnosis not present

## 2020-02-13 LAB — BASIC METABOLIC PANEL
Anion gap: 7 (ref 5–15)
BUN: 12 mg/dL (ref 6–20)
CO2: 23 mmol/L (ref 22–32)
Calcium: 8.2 mg/dL — ABNORMAL LOW (ref 8.9–10.3)
Chloride: 109 mmol/L (ref 98–111)
Creatinine, Ser: 0.52 mg/dL (ref 0.44–1.00)
GFR, Estimated: >60 mL/min (ref >60)
Glucose, Bld: 90 mg/dL (ref 70–99)
Potassium: 3.5 mmol/L (ref 3.5–5.1)
Sodium: 139 mmol/L (ref 135–145)

## 2020-02-13 LAB — CBC WITH DIFFERENTIAL/PLATELET
Abs Immature Granulocytes: 0.08 10*3/uL — ABNORMAL HIGH (ref 0.00–0.07)
Basophils Absolute: 0 10*3/uL (ref 0.0–0.1)
Basophils Relative: 0 %
Eosinophils Absolute: 0.1 10*3/uL (ref 0.0–0.5)
Eosinophils Relative: 1 %
HCT: 33.9 % — ABNORMAL LOW (ref 36.0–46.0)
Hemoglobin: 10.8 g/dL — ABNORMAL LOW (ref 12.0–15.0)
Immature Granulocytes: 1 %
Lymphocytes Relative: 19 %
Lymphs Abs: 1.5 10*3/uL (ref 0.7–4.0)
MCH: 29.4 pg (ref 26.0–34.0)
MCHC: 31.9 g/dL (ref 30.0–36.0)
MCV: 92.4 fL (ref 80.0–100.0)
Monocytes Absolute: 0.7 10*3/uL (ref 0.1–1.0)
Monocytes Relative: 9 %
Neutro Abs: 5.4 10*3/uL (ref 1.7–7.7)
Neutrophils Relative %: 70 %
Platelets: 223 10*3/uL (ref 150–400)
RBC: 3.67 MIL/uL — ABNORMAL LOW (ref 3.87–5.11)
RDW: 14 % (ref 11.5–15.5)
WBC: 7.8 10*3/uL (ref 4.0–10.5)
nRBC: 0 % (ref 0.0–0.2)

## 2020-02-13 LAB — AMMONIA: Ammonia: 24 umol/L (ref 9–35)

## 2020-02-13 LAB — HIV ANTIBODY (ROUTINE TESTING W REFLEX): HIV Screen 4th Generation wRfx: NONREACTIVE

## 2020-02-13 MED ORDER — LAMOTRIGINE 25 MG PO TABS
150.0000 mg | ORAL_TABLET | Freq: Two times a day (BID) | ORAL | Status: DC
  Administered 2020-02-13 – 2020-02-14 (×2): 150 mg via ORAL
  Filled 2020-02-13 (×2): qty 2

## 2020-02-13 MED ORDER — POTASSIUM CHLORIDE IN NACL 20-0.9 MEQ/L-% IV SOLN
INTRAVENOUS | Status: AC
  Filled 2020-02-13: qty 1000

## 2020-02-13 MED ORDER — LEVETIRACETAM 500 MG PO TABS
1000.0000 mg | ORAL_TABLET | Freq: Two times a day (BID) | ORAL | Status: DC
  Administered 2020-02-13 – 2020-02-14 (×2): 1000 mg via ORAL
  Filled 2020-02-13 (×3): qty 2

## 2020-02-13 MED ORDER — GUAIFENESIN-DM 100-10 MG/5ML PO SYRP
5.0000 mL | ORAL_SOLUTION | ORAL | Status: DC | PRN
  Administered 2020-02-13 – 2020-02-14 (×3): 5 mL via ORAL
  Filled 2020-02-13 (×3): qty 5

## 2020-02-13 MED ORDER — LORAZEPAM 1 MG PO TABS
1.0000 mg | ORAL_TABLET | Freq: Every day | ORAL | Status: DC | PRN
  Filled 2020-02-13: qty 1

## 2020-02-13 MED ORDER — PANTOPRAZOLE SODIUM 40 MG PO TBEC
40.0000 mg | DELAYED_RELEASE_TABLET | Freq: Every day | ORAL | Status: DC
  Administered 2020-02-13 – 2020-02-14 (×2): 40 mg via ORAL
  Filled 2020-02-13 (×2): qty 1

## 2020-02-13 NOTE — Progress Notes (Signed)
PROGRESS NOTE  [name] TGG:[id number] DOB: Nov 02, 1965 DOA: 02/12/2020 PCP: [name], MD  Brief History:  55 year old female with a history of COPD, meningioma, cardiac arrest December 2020, hepatic steatosis, PNES, cognitive impairment, depression, complex partial seizure presenting with 1 week history of coughing, shortness of breath, and pleuritic chest discomfort.  The patient has been having some subjective fevers and chills.  She stated that she had 3 episodes of emesis on 02/12/2020 without any blood.  She denies any hemoptysis, coffee-ground emesis, melena, abdominal pain, hematochezia.  She states that she has never smoked.  As the week progressed, the patient continued to have some generalized weakness and began feeling dizzy.  She has had 6 falls without loss of consciousness since 02/11/2020.  She denies any alcohol use or illicit drugs.  Apparently, the patient went to urgent care on 02/08/2020.  She was prescribed Omnicef and prednisone. In the emergency department, the patient had low-grade temperature of 99.6 F.  She was tachycardic up to 110.  She was hemodynamically stable.  Oxygen saturation was 96% on room air.  BMP was essentially unremarkable.  WBC 11.7, hemoglobin 12.0, platelets 223,000.  UA 11-20 WBC.  Chest x-ray showed right lower lobe bandlike opacity.  CT abdomen showed right lower lobe consolidation with air bronchograms.  There was hepatic steatosis and a stable left adnexal cyst.  The patient was started on ceftriaxone and azithromycin.  Assessment/Plan: Community-acquired pneumonia -Continue azithromycin and ceftriaxone -Urine Legionella antigen -02/12/2020 chest x-ray personally reviewed--right lower lobe bandlike opacity, increased interstitial markings  Gait instability/frequent falls -PT evaluation -Check orthostatic vital signs -CT brain negative -No focal deficits on exam  Hyperammonemia -clinical significance unclear presently as she  is awake, alert and conversant -repeat ammonia  Pyuria -UA- 11-20 WBC -Urine culture was not sent -Urine culture low yield at this point -Continue ceftriaxone for pneumonia  Complex partial seizure/PNES -Continue home dose of Keppra and Lamictal  Depression -Continue fluoxetine 80 mg daily -Continue as needed lorazepam  Meningioma -Patient follows Dr. [name] -s/p fractionated radiosurgery Sept 2020--Dr. [name]      Status is: Observation  The patient will require care spanning > 2 midnights and should be moved to inpatient because: IV treatments appropriate due to intensity of illness or inability to take PO  Dispo: The patient is from: Home              Anticipated d/c is to: Home              Anticipated d/c date is: 2 days              Patient currently is not medically stable to d/c.   Difficult to place patient No        Family Communication:  no Family at bedside  Consultants:  none  Code Status:  FULL  DVT Prophylaxis: [place] Lovenox   Procedures: As Listed in Progress Note Above  Antibiotics: Ceftriaxone/azithro 1/30>>     Subjective: She continues to have generalized weakness. N/v are better.  Denies f/c, cp, abd pain, diarrhea, hematochezia, melena  Objective: Vitals:   02/12/20 2230 02/12/20 2300 02/12/20 2330 02/13/20 0842  BP: 105/79 (!) 120/93  121/87  Pulse: 84 77 76 65  Resp: (!) 22 19 19 14   Temp:    98.7 F (37.1 C)  TempSrc:    Oral  SpO2: 96% 96% 96% 99%  Weight:      Height:  Intake/Output Summary (Last 24 hours) at 02/13/2020 0858 Last data filed at 02/13/2020 0838 Gross per 24 hour  Intake 1353 ml  Output --  Net 1353 ml   Weight change:  Exam:   General:  Pt is alert, follows commands appropriately, not in acute distress  HEENT: No icterus, No thrush, No neck mass, [place]/AT  Cardiovascular: RRR, S1/S2, no rubs, no gallops  Respiratory: CTA bilaterally, no wheezing, no crackles, no rhonchi  Abdomen:  Soft/+BS, non tender, non distended, no guarding  Extremities: No edema, No lymphangitis, No petechiae, No rashes, no synovitis   Data Reviewed: I have personally reviewed following labs and imaging studies Basic Metabolic Panel: Recent Labs  Lab 02/12/20 1530 02/12/20 2134 02/13/20 0519  NA 138  --  139  K 3.7  --  3.5  CL 106  --  109  CO2 23  --  23  GLUCOSE 90  --  90  BUN 17  --  12  CREATININE 0.64 0.65 0.52  CALCIUM 8.7*  --  8.2*   Liver Function Tests: Recent Labs  Lab 02/12/20 1530  AST 89*  ALT 69*  ALKPHOS 104  BILITOT 1.1  PROT 6.8  ALBUMIN 3.2*   No results for input(s): LIPASE, AMYLASE in the last 168 hours. Recent Labs  Lab 02/12/20 1533  AMMONIA 53*   Coagulation Profile: No results for input(s): INR, PROTIME in the last 168 hours. CBC: Recent Labs  Lab 02/12/20 1530 02/12/20 2134 02/13/20 0519  WBC 11.7* 10.2 7.8  NEUTROABS 8.5*  --  5.4  HGB 12.0 11.0* 10.8*  HCT 36.9 34.1* 33.9*  MCV 91.3 93.4 92.4  PLT 314 253 223   Cardiac Enzymes: No results for input(s): CKTOTAL, CKMB, CKMBINDEX, TROPONINI in the last 168 hours. BNP: Invalid input(s): POCBNP CBG: No results for input(s): GLUCAP in the last 168 hours. HbA1C: No results for input(s): HGBA1C in the last 72 hours. Urine analysis:    Component Value Date/Time   COLORURINE YELLOW 02/12/2020 1957   APPEARANCEUR CLEAR 02/12/2020 1957   APPEARANCEUR Clear 01/30/2017 1020   LABSPEC 1.043 (H) 02/12/2020 1957   PHURINE 5.0 02/12/2020 1957   GLUCOSEU NEGATIVE 02/12/2020 1957   HGBUR MODERATE (A) 02/12/2020 1957   BILIRUBINUR NEGATIVE 02/12/2020 1957   BILIRUBINUR Negative 01/30/2017 1020   KETONESUR 5 (A) 02/12/2020 1957   PROTEINUR NEGATIVE 02/12/2020 1957   UROBILINOGEN negative 04/19/2014 1332   UROBILINOGEN 0.2 12/04/2013 1414   NITRITE NEGATIVE 02/12/2020 1957   LEUKOCYTESUR NEGATIVE 02/12/2020 1957   Sepsis Labs: @LABRCNTIP (procalcitonin:4,lacticidven:4) ) Recent  Results (from the past 240 hour(s))  SARS Coronavirus 2 by RT PCR (hospital order, performed in [facility] hospital lab) Nasopharyngeal Nasopharyngeal Swab     Status: None   Collection Time: 02/12/20  3:31 PM   Specimen: Nasopharyngeal Swab  Result Value Ref Range Status   SARS Coronavirus 2 NEGATIVE NEGATIVE Final    Comment: (NOTE) SARS-CoV-2 target nucleic acids are NOT DETECTED.  The SARS-CoV-2 RNA is generally detectable in upper and lower respiratory specimens during the acute phase of infection. The lowest concentration of SARS-CoV-2 viral copies this assay can detect is 250 copies / mL. A negative result does not preclude SARS-CoV-2 infection and should not be used as the sole basis for treatment or other patient management decisions.  A negative result may occur with improper specimen collection / handling, submission of specimen other than nasopharyngeal swab, presence of viral mutation(s) within the areas targeted by this assay, and inadequate  number of viral copies (<250 copies / mL). A negative result must be combined with clinical observations, patient history, and epidemiological information.  Fact Sheet for Patients:   StrictlyIdeas.no  Fact Sheet for Healthcare Providers: BankingDealers.co.za  This test is not yet approved or  cleared by the [place] FDA and has been authorized for detection and/or diagnosis of SARS-CoV-2 by FDA under an Emergency Use Authorization (EUA).  This EUA will remain in effect (meaning this test can be used) for the duration of the COVID-19 declaration under Section 564(b)(1) of the Act, 21 U.S.C. section 360bbb-3(b)(1), unless the authorization is terminated or revoked sooner.  Performed at [facility], [address], [place], [place] [address]      Scheduled Meds: . enoxaparin (LOVENOX) injection  40 mg Subcutaneous Q24H  . sodium chloride flush  3 mL Intravenous Q12H    Continuous Infusions: . sodium chloride    . azithromycin    . cefTRIAXone (ROCEPHIN)  IV      Procedures/Studies: CT Head Wo Contrast  Result Date: 02/12/2020 CLINICAL DATA:  Head trauma, fell yesterday striking head, loss of consciousness, complaining of dizziness, history of seizures EXAM: CT HEAD WITHOUT CONTRAST TECHNIQUE: Contiguous axial images were obtained from the base of the skull through the vertex without intravenous contrast. COMPARISON:  06/22/2019 FINDINGS: Brain: Normal ventricular morphology. No midline shift or mass effect. Normal appearance of brain parenchyma. No intracranial hemorrhage, mass lesion, or evidence of acute infarction. No extra-axial fluid collections. Vascular: No hyperdense vessels. Minimal atherosclerotic calcification within internal carotid arteries at skull base Skull: Intact Sinuses/Orbits: Opacified RIGHT middle ear cavity. Mucosal thickening throughout the paranasal sinuses with small fluid levels in the maxillary and frontal sinuses. Other: N/A IMPRESSION: No acute intracranial abnormalities. Scattered sinus disease changes as above. Opacified RIGHT middle ear cavity new since previous exam, recommend correlation with physical exam. Electronically Signed   By: Lavonia Dana M.D.   On: 02/12/2020 16:15   MR BRAIN W WO CONTRAST  Result Date: 01/18/2020 CLINICAL DATA:  Meningioma. Brain/CNS neoplasm, assess treatment response. Additional history provided by scanning technologist: Patient reports feeling "off balance " for several weeks. EXAM: MRI HEAD WITHOUT AND WITH CONTRAST TECHNIQUE: Multiplanar, multiecho pulse sequences of the brain and surrounding structures were obtained without and with intravenous contrast. CONTRAST:  22mL GADAVIST GADOBUTROL 1 MMOL/ML IV SOLN COMPARISON:  Prior brain MRI 09/13/2019. MRI brain/orbits 08/20/2019. FINDINGS: Brain: Mild intermittent motion degradation. Cerebral volume is normal for age. Redemonstrated avidly enhancing  mass expanding the left cavernous sinus and extending inferiorly to fill Meckel's cave. As before, the dominant component of the mass within the left cavernous sinus measures 2.4 x 1.2 cm in transaxial dimensions. Also unchanged, a 0.4 x 0.6 cm pedunculated component of the tumor extends into the left prepontine cistern with slight mass effect upon the pons. Stable extension of tumor along the adjacent anterior left tentorium. There is no acute infarct. No chronic intracranial blood products. No extra-axial fluid collection. No midline shift. Vascular: Expected proximal arterial flow voids. Skull and upper cervical spine: No focal marrow lesion. Sinuses/Orbits: Visualized orbits show no acute finding. Frothy secretions within the left frontal sinus. There is severe bilateral ethmoid sinus mucosal thickening. Moderate mucosal thickening within the bilateral sphenoid sinuses. Moderate/severe mucosal thickening within the right maxillary sinus. Mild mucosal thickening within the left maxillary sinus. IMPRESSION: Mildly motion degraded exam. Redemonstrated enhancing mass consistent with meningioma centered within the left cavernous sinus and extending into left Meckel's cave, into the  left prepontine cistern and along the anterior left tentorium. The mass is stable in size as compared to the brain MRI of 09/13/2019. As before, there is minimal mass effect upon the pons. Progressive paranasal sinus disease as described. Electronically Signed   By: Kellie Simmering DO   On: 01/18/2020 10:25   CT ABDOMEN PELVIS W CONTRAST  Result Date: 02/12/2020 CLINICAL DATA:  Abdominal trauma after fall, flank ecchymosis. Patient reports chronic lower abdominal pain. EXAM: CT ABDOMEN AND PELVIS WITH CONTRAST TECHNIQUE: Multidetector CT imaging of the abdomen and pelvis was performed using the standard protocol following bolus administration of intravenous contrast. CONTRAST:  53mL OMNIPAQUE IOHEXOL 300 MG/ML  SOLN COMPARISON:   Noncontrast CT 11/29/2017 FINDINGS: Lower chest: Right lower lobe consolidation with internal air bronchograms, series 4, image 7. Dependent opacity in the left lower lobe. Upper normal heart size. Hepatobiliary: Mild diffusely decreased hepatic density consistent with steatosis. No focal hepatic abnormality. No evidence of hepatic injury or perihepatic hematoma. Gallbladder physiologically distended, no calcified stone. No biliary dilatation. Pancreas: No acute finding. No ductal dilatation. Stable fullness in the pancreatic head. No discrete pancreatic mass. Spleen: Normal in size. No focal abnormality. No evidence of splenic injury or perisplenic hematoma. Adrenals/Urinary Tract: No adrenal nodule or hemorrhage. No renal injury. No hydronephrosis. Bilateral nonobstructing renal calculi. No focal renal lesion. Unremarkable urinary bladder. Stomach/Bowel: Decompressed stomach, motion through the upper abdomen limits detailed assessment. No bowel obstruction, inflammation, or evident wall thickening. No evidence of bowel injury. Moderately large stool burden throughout the colon. Low lying cecum in the deep pelvis. Appendix not confidently visualized. No appendicitis. No mesenteric hematoma. Vascular/Lymphatic: Aortic and branch atherosclerosis. No acute vascular findings are vascular injury. No retroperitoneal fluid. No enlarged lymph nodes in the abdomen or pelvis. Probable calcified node abutting the ascending colon. Reproductive: Hysterectomy. 2.7 cm left ovarian/adnexal cyst. This is not significantly changed from prior exam. Other: No free air or free fluid. No confluent soft tissue hematoma. Musculoskeletal: Left hip arthroplasty. No acute fracture of the pelvis or lumbar spine. Multilevel lumbar spondylosis. IMPRESSION: 1. No acute traumatic injury to the abdomen or pelvis. 2. Right lower lobe consolidation with internal air bronchograms, suspicious for pneumonia. Dependent opacity in the left lower lobe  favors atelectasis. 3. Bilateral nonobstructing renal calculi. 4. Mild hepatic steatosis. 5. Stable 2.7 cm left ovarian/adnexal cyst. No follow-up imaging recommended. Note: This recommendation does not apply to premenarchal patients and to those with increased risk (genetic, family history, elevated tumor markers or other high-risk factors) of ovarian cancer. Reference: JACR 2020 Feb; 17(2):248-254 Aortic Atherosclerosis (ICD10-I70.0). Electronically Signed   By: Keith Rake M.D.   On: 02/12/2020 17:36   DG Chest Portable 1 View  Result Date: 02/12/2020 CLINICAL DATA:  Cough and shortness of breath EXAM: PORTABLE CHEST 1 VIEW COMPARISON:  03/03/2017 FINDINGS: Linear bandlike density above the right hemidiaphragm favors atelectasis given the configuration, less likely to be related to pneumonia. Old healed fracture of the right lateral seventh rib. Thoracic spondylosis. Heart size within normal limits for projection. The left lung appears clear. No blunting of the costophrenic angles. IMPRESSION: 1. Linear bandlike density above the right hemidiaphragm favors atelectasis over early pneumonia. Electronically Signed   By: Van Clines M.D.   On: 02/12/2020 15:59    Orson Eva, DO  [facility]  If 7PM-7AM, please contact night-coverage [email or web address] Password [email or web address] 02/13/2020, 8:58 AM   LOS: 0 days

## 2020-02-13 NOTE — Evaluation (Signed)
Physical Therapy Evaluation Patient Details Name: [name] MRN: [id number] DOB: 01-26-1965 Today's Date: 02/13/2020   History of Present Illness  [name] is a 55 y.o. female with medical history significant of mental delay, epilepsy, recently diagnosed with a UTI in the ED here sent home however has not gotten her medicines refilled.  She really does not know if if she is got her medicine refilled her mom controls her medicines.  She has been having cough and not feeling well.  She supposedly has been on cefdinir for the last 5 days.  She also has been having frequent falls.  Patient found to have possible pneumonia on chest x-ray O2 sats are normal on room air.  She has been referred for admission for admission for pneumonia.  Also in the setting of frequent falling.    Clinical Impression  Patient functioning near baseline for functional mobility and gait, slightly unsteady with near loss of balance during initial standing, after a few steps balance improved and patient able to ambulate without AD, due to frequent falls at home over months, patient instructed in use of RW with good carryover demonstrated.  Patient will benefit from continued physical therapy in hospital and recommended venue below to increase strength, balance, endurance for safe ADLs and gait.     Follow Up Recommendations Home health PT;Supervision - Intermittent    Equipment Recommendations  Other (comment) Facilities manager)    Recommendations for Other Services       Precautions / Restrictions Precautions Precautions: Fall Restrictions Weight Bearing Restrictions: No      Mobility  Bed Mobility Overal bed mobility: Modified Independent                  Transfers Overall transfer level: Needs assistance Equipment used: None;Rolling walker (2 wheeled) Transfers: Sit to/from [facility] Sit to Stand: Supervision Stand pivot transfers: Min guard       General transfer  comment: when initally standing without AD had loss of balance requiring Min guard assist, afer standing for a few minutes, balance improved and safer using RW  Ambulation/Gait Ambulation/Gait assistance: Supervision;Min guard Gait Distance (Feet): 100 Feet Assistive device: Rolling walker (2 wheeled);None Gait Pattern/deviations: Decreased step length - right;Decreased step length - left;Decreased stride length Gait velocity: decreased   General Gait Details: slightly unsteady initially when taking steps, improvement noted with fair balance for ambulation without AD, fair/good using RW  Stairs            Wheelchair Mobility    Modified Rankin (Stroke Patients Only)       Balance Overall balance assessment: Needs assistance Sitting-balance support: Feet supported;No upper extremity supported Sitting balance-Leahy Scale: Good Sitting balance - Comments: seated at EOB   Standing balance support: During functional activity;No upper extremity supported Standing balance-Leahy Scale: Fair Standing balance comment: fair without AD, fair/good using RW                             Pertinent Vitals/Pain Pain Assessment: Faces Faces Pain Scale: Hurts a little bit Pain Location: left hip, left side Pain Descriptors / Indicators: Sore;Discomfort Pain Intervention(s): Limited activity within patient's tolerance;Monitored during session    [place] expects to be discharged to:: Private residence Living Arrangements: Alone Available Help at Discharge: Family;Available PRN/intermittently Type of Home: Mobile home Home Access: Stairs to enter Entrance Stairs-Rails: Right;Left;Can reach both Entrance Stairs-Number of Steps: 4 Home Layout: One level  Home Equipment: Shower seat;Bedside commode      Prior Function Level of Independence: Needs assistance   Gait / Transfers Assistance Needed: household and short distanced community ambulator without  AD  ADL's / Homemaking Assistance Needed: assisted for community ADLs by family        Hand Dominance        Extremity/Trunk Assessment   Upper Extremity Assessment Upper Extremity Assessment: Overall WFL for tasks assessed    Lower Extremity Assessment Lower Extremity Assessment: Generalized weakness    Cervical / Trunk Assessment Cervical / Trunk Assessment: Normal  Communication   Communication: No difficulties  Cognition Arousal/Alertness: Awake/alert Behavior During Therapy: WFL for tasks assessed/performed Overall Cognitive Status: Within Functional Limits for tasks assessed                                        General Comments      Exercises     Assessment/Plan    PT Assessment Patient needs continued PT services  PT Problem List         PT Treatment Interventions DME instruction;Gait training;Stair training;Functional mobility training;Therapeutic activities;Therapeutic exercise;Balance training;Patient/family education    PT Goals (Current goals can be found in the Care Plan section)  Acute Rehab PT Goals Patient Stated Goal: return home with family to assist PT Goal Formulation: With patient Time For Goal Achievement: 02/17/20 Potential to Achieve Goals: Good    Frequency Min 3X/week   Barriers to discharge        Co-evaluation               AM-PAC PT "6 Clicks" Mobility  Outcome Measure Help needed turning from your back to your side while in a flat bed without using bedrails?: None Help needed moving from lying on your back to sitting on the side of a flat bed without using bedrails?: None Help needed moving to and from a bed to a chair (including a wheelchair)?: A Little Help needed standing up from a chair using your arms (e.g., wheelchair or bedside chair)?: A Little Help needed to walk in hospital room?: A Little Help needed climbing 3-5 steps with a railing? : A Little 6 Click Score: 20    End of Session    Activity Tolerance: Patient tolerated treatment well;Patient limited by fatigue Patient left: in bed;with call bell/phone within reach Nurse Communication: Mobility status PT Visit Diagnosis: Unsteadiness on feet (R26.81);Other abnormalities of gait and mobility (R26.89);Muscle weakness (generalized) (M62.81)    Time: 6967-8938 PT Time Calculation (min) (ACUTE ONLY): 26 min   Charges:   PT Evaluation $PT Eval Moderate Complexity: 1 Mod PT Treatments $Therapeutic Activity: 23-37 mins        12:37 PM, 02/13/20 Lonell Grandchild, MPT Physical Therapist with [facility] [phone] office [phone] mobile phone

## 2020-02-13 NOTE — Plan of Care (Signed)
  Problem: Acute Rehab PT Goals(only PT should resolve) Goal: Pt Will Go Supine/Side To Sit Outcome: Progressing Flowsheets (Taken 02/13/2020 1238) Pt will go Supine/Side to Sit:  Independently  with modified independence Goal: Patient Will Transfer Sit To/From Stand Outcome: Progressing Flowsheets (Taken 02/13/2020 1238) Patient will transfer sit to/from stand:  with modified independence  with supervision Goal: Pt Will Transfer Bed To Chair/Chair To Bed Outcome: Progressing Flowsheets (Taken 02/13/2020 1238) Pt will Transfer Bed to Chair/Chair to Bed:  with modified independence  with supervision Goal: Pt Will Ambulate Outcome: Progressing Flowsheets (Taken 02/13/2020 1238) Pt will Ambulate:  > 125 feet  with modified independence  with rolling walker   12:40 PM, 02/13/20 Lonell Grandchild, MPT Physical Therapist with [facility] [phone] office [phone] mobile phone

## 2020-02-14 DIAGNOSIS — E7220 Disorder of urea cycle metabolism, unspecified: Secondary | ICD-10-CM | POA: Diagnosis not present

## 2020-02-14 DIAGNOSIS — G3184 Mild cognitive impairment, so stated: Secondary | ICD-10-CM | POA: Diagnosis not present

## 2020-02-14 DIAGNOSIS — G40201 Localization-related (focal) (partial) symptomatic epilepsy and epileptic syndromes with complex partial seizures, not intractable, with status epilepticus: Secondary | ICD-10-CM | POA: Diagnosis not present

## 2020-02-14 DIAGNOSIS — J181 Lobar pneumonia, unspecified organism: Secondary | ICD-10-CM | POA: Diagnosis not present

## 2020-02-14 LAB — COMPREHENSIVE METABOLIC PANEL
ALT: 58 U/L — ABNORMAL HIGH (ref 0–44)
AST: 77 U/L — ABNORMAL HIGH (ref 15–41)
Albumin: 2.6 g/dL — ABNORMAL LOW (ref 3.5–5.0)
Alkaline Phosphatase: 87 U/L (ref 38–126)
Anion gap: 6 (ref 5–15)
BUN: 6 mg/dL (ref 6–20)
CO2: 25 mmol/L (ref 22–32)
Calcium: 8 mg/dL — ABNORMAL LOW (ref 8.9–10.3)
Chloride: 109 mmol/L (ref 98–111)
Creatinine, Ser: 0.43 mg/dL — ABNORMAL LOW (ref 0.44–1.00)
GFR, Estimated: >60 mL/min (ref >60)
Glucose, Bld: 86 mg/dL (ref 70–99)
Potassium: 3.5 mmol/L (ref 3.5–5.1)
Sodium: 140 mmol/L (ref 135–145)
Total Bilirubin: 0.9 mg/dL (ref 0.3–1.2)
Total Protein: 5.7 g/dL — ABNORMAL LOW (ref 6.5–8.1)

## 2020-02-14 LAB — CBC
HCT: 32.3 % — ABNORMAL LOW (ref 36.0–46.0)
Hemoglobin: 10.5 g/dL — ABNORMAL LOW (ref 12.0–15.0)
MCH: 30 pg (ref 26.0–34.0)
MCHC: 32.5 g/dL (ref 30.0–36.0)
MCV: 92.3 fL (ref 80.0–100.0)
Platelets: 216 10*3/uL (ref 150–400)
RBC: 3.5 MIL/uL — ABNORMAL LOW (ref 3.87–5.11)
RDW: 14.2 % (ref 11.5–15.5)
WBC: 5.9 10*3/uL (ref 4.0–10.5)
nRBC: 0 % (ref 0.0–0.2)

## 2020-02-14 MED ORDER — AZITHROMYCIN 500 MG PO TABS: 500.0000 mg | ORAL_TABLET | Freq: Every day | ORAL | 0 refills | Status: DC

## 2020-02-14 MED ORDER — CEFDINIR 300 MG PO CAPS
300.0000 mg | ORAL_CAPSULE | Freq: Two times a day (BID) | ORAL | Status: DC
  Administered 2020-02-14: 300 mg via ORAL
  Filled 2020-02-14: qty 1

## 2020-02-14 MED ORDER — CEFDINIR 300 MG PO CAPS: 300.0000 mg | ORAL_CAPSULE | Freq: Two times a day (BID) | ORAL | 0 refills | Status: DC

## 2020-02-14 MED ORDER — AZITHROMYCIN 250 MG PO TABS
500.0000 mg | ORAL_TABLET | Freq: Every day | ORAL | Status: DC
  Administered 2020-02-14: 500 mg via ORAL
  Filled 2020-02-14: qty 2

## 2020-02-14 NOTE — TOC Transition Note (Signed)
Transition of Care [facility]) - CM/SW Discharge Note   Patient Details  Name: [name] MRN: [id number] Date of Birth: 08-Nov-1965  Transition of Care [facility]) CM/SW Contact:  Shade [name], LCSW Phone Number: 02/14/2020, 12:22 PM   Clinical Narrative:     Pt stable for dc per MD. Pt needing HH and a rollator and a shower seat. Spoke with pt about HH and DME and she is agreeable. Discussed provider options and referred to [facility] at pt request. Per pt, she lives alone but she does have family members who live nearby and can assist her.  Pt did not have any other TOC needs for dc.   Expected Discharge Plan: [place] Barriers to Discharge: Barriers Resolved   Patient Goals and CMS Choice Patient states their goals for this hospitalization and ongoing recovery are:: Go home CMS Medicare.gov Compare Post Acute Care list provided to:: Patient Choice offered to / list presented to : Patient  Expected Discharge Plan and Services Expected Discharge Plan: [place] In-house Referral: Clinical Social Work   Post Acute Care Choice: [place] arrangements for the past 2 months: Mobile Home Expected Discharge Date: 02/14/20               DME Arranged: Gilford Rile rolling with seat DME Agency: [facility] Date DME Agency Contacted: 02/14/20   Representative spoke with at DME Agency: [place]: RN,PT [place] Agency: [place] ([place]) Date [place]: 02/14/20   Representative spoke with at [place]: Vaughan Basta  Prior Living Arrangements/Services Living arrangements for the past 2 months: Mobile Home Lives with:: Self Patient language and need for interpreter reviewed:: Yes Do you feel safe going back to the place where you live?: Yes      Need for Family Participation in Patient Care: Yes (Comment) Care giver support system in place?: Yes (comment)   Criminal Activity/Legal Involvement Pertinent to Current  Situation/Hospitalization: No - Comment as needed  Activities of Daily Living Home Assistive Devices/Equipment: None ADL Screening (condition at time of admission) Patient's cognitive ability adequate to safely complete daily activities?: Yes Is the patient deaf or have difficulty hearing?: No Does the patient have difficulty seeing, even when wearing glasses/contacts?: No Does the patient have difficulty concentrating, remembering, or making decisions?: No Patient able to express need for assistance with ADLs?: Yes Does the patient have difficulty dressing or bathing?: No Independently performs ADLs?: Yes (appropriate for developmental age) Does the patient have difficulty walking or climbing stairs?: No Weakness of Legs: Both Weakness of Arms/Hands: None  Permission Sought/Granted Permission sought to share information with : Chartered certified accountant granted to share information with : Yes, Verbal Permission Granted     Permission granted to share info w AGENCY: [place] and DME agencies        Emotional Assessment   Attitude/Demeanor/Rapport: Engaged Affect (typically observed): Pleasant Orientation: : Oriented to Self,Oriented to Place,Oriented to  Time,Oriented to Situation Alcohol / Substance Use: Not Applicable Psych Involvement: No (comment)  Admission diagnosis:  Lobar pneumonia ([place]) [J18.1] CAP (community acquired pneumonia) [J18.9] Multiple falls [R29.6] Community acquired pneumonia, unspecified laterality [J18.9] Patient Active Problem List   Diagnosis Date Noted  . Hyperammonemia ([place]) 02/13/2020  . Meningioma ([place]) 09/12/2019  . Benign neoplasm of cerebral meninges ([place]) 09/09/2019  . Anxiety 12/30/2018  . Cardiac arrest ([place]) 12/15/2018  . MDD (major depressive disorder), recurrent episode, mild ([place]) 01/08/2018  . Widowed - July 2019  08/07/2017  . Sleep apnea 06/09/2017  . Colon polyp 06/05/2017  . Elevated liver function tests 06/05/2017   . Fatty liver   . Mild neurocognitive disorder 02/04/2017  . Seizure ([place]) 11/14/2016  . CAP (community acquired pneumonia) 11/09/2016  . 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 Lobar pneumonia ([place]) 11/28/2015  . Partial symptomatic epilepsy with complex partial seizures, not intractable, with status epilepticus ([place]) 07/24/2015  . OA (osteoarthritis) of knee 05/07/2015  . 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[place]) 08/17/2013   PCP:  [name], MD Pharmacy:   [facility] [address] [place] [address] [place] [address] Phone: [phone] Fax: [phone]     Social Determinants of Health (SDOH) Interventions    Readmission Risk Interventions Readmission Risk Prevention Plan 02/14/2020  Medication Screening Complete  Transportation Screening Complete  Some recent data might be hidden     Final next level of care: Home w Home Health Services Barriers to Discharge: Barriers Resolved   Patient Goals and CMS Choice Patient states their goals for this hospitalization and ongoing recovery are:: Go home CMS Medicare.gov Compare Post Acute Care list provided to:: Patient Choice offered to / list presented to : Patient  Discharge Placement                       Discharge Plan and Services In-house Referral: Clinical Social Work   Post Acute Care Choice: Home Health          DME Arranged: [place] rolling with seat DME Agency: [facility] Date DME Agency Contacted: 02/14/20   Representative spoke with at DME Agency: [place]: RN,PT [place] Agency: [place] ([place]) Date [place]: 02/14/20   Representative spoke with at [place]: [place] ([place]) Interventions     Readmission Risk Interventions Readmission Risk Prevention Plan 02/14/2020  Medication Screening Complete  Transportation Screening Complete  Some recent data might be hidden

## 2020-02-14 NOTE — Progress Notes (Signed)
   02/14/20 0900  Vitals  Temp 98.2 F (36.8 C)  Temp Source Oral  BP 101/72  MAP (mmHg) 83  BP Location Right Arm  BP Method Automatic  Patient Position (if appropriate) Lying  Pulse Rate 75  Resp 16  Level of Consciousness  Level of Consciousness Alert  MEWS COLOR  MEWS Score Color Green  Orthostatic Lying   BP- Lying 104/79 (map-89)  Pulse- Lying 73  Orthostatic Sitting  BP- Sitting 99/76  Pulse- Sitting 77  Orthostatic Standing at 0 minutes  BP- Standing at 0 minutes 105/82  Pulse- Standing at 0 minutes 80  Oxygen Therapy  SpO2 99 %  O2 Device Room Air  PCA/Epidural/Spinal Assessment  Respiratory Pattern Regular;Unlabored  MEWS Score  MEWS Temp 0  MEWS Systolic 0  MEWS Pulse 0  MEWS RR 0  MEWS LOC 0  MEWS Score 0

## 2020-02-14 NOTE — Discharge Summary (Signed)
Physician Discharge Summary  [name] JJO:[id number] DOB: 07-Nov-1965 DOA: 02/12/2020  PCP: [name], MD  Admit date: 02/12/2020 Discharge date: 02/14/2020  Admitted From: Home Disposition:  Home   Recommendations for Outpatient Follow-up:  1. Follow up with PCP in 1-2 weeks 2. Please obtain BMP/CBC in one week   Home Health: YES Equipment/Devices: HHPT, Rollator, shower chair  Discharge Condition: Stable CODE STATUS: FULL Diet recommendation: Heart Healthy    Brief/Interim Summary: 55 year old female with a history of COPD, meningioma, cardiac arrest December 2020, hepatic steatosis, PNES, cognitive impairment, depression, complex partial seizure presenting with 1 week history of coughing, shortness of breath, and pleuritic chest discomfort.  The patient has been having some subjective fevers and chills.  She stated that she had 3 episodes of emesis on 02/12/2020 without any blood.  She denies any hemoptysis, coffee-ground emesis, melena, abdominal pain, hematochezia.  She states that she has never smoked.  As the week progressed, the patient continued to have some generalized weakness and began feeling dizzy.  She has had 6 falls without loss of consciousness since 02/11/2020.  She denies any alcohol use or illicit drugs.  Apparently, the patient went to urgent care on 02/08/2020.  She was prescribed Omnicef and prednisone. In the emergency department, the patient had low-grade temperature of 99.6 F.  She was tachycardic up to 110.  She was hemodynamically stable.  Oxygen saturation was 96% on room air.  BMP was essentially unremarkable.  WBC 11.7, hemoglobin 12.0, platelets 223,000.  UA 11-20 WBC.  Chest x-ray showed right lower lobe bandlike opacity.  CT abdomen showed right lower lobe consolidation with air bronchograms.  There was hepatic steatosis and a stable left adnexal cyst.  The patient was started on ceftriaxone and azithromycin.  Discharge Diagnoses:   Community-acquired pneumonia -Continue azithromycin and ceftriaxone -stable on RA -02/12/2020 chest x-ray personally reviewed--right lower lobe bandlike opacity, increased interstitial markings -CT abd/pelvis--RLL consolidation with air bronchograms -WBC 10.2>>>5.9 -d/c home with 5 more days cefdinir/azithro  Gait instability/frequent falls -PT evaluation-->HHPT -Check orthostatic vital signs--positive -CT brain negative acute findings -No focal deficits on exam  Orthostatic Hypotension -pt was fluid resuscitated -02/14/20--repeat orthostatics neg -pt is symptomatically improving (dizziness)  Hyperammonemia -clinical significance unclear presently as she is awake, alert and conversant -repeat ammonia 24 (initially 54) -mental status overall stable A&O x 4  Pyuria -UA- 11-20 WBC -Urine culture was not sent -Urine culture low yield at this point -Continue ceftriaxone for pneumonia  Complex partial seizure/PNES -Continue home dose of Keppra and Lamictal  Depression -Continue fluoxetine 80 mg daily -Continue as needed lorazepam  Meningioma -Patient follows Dr. [name] -s/p fractionated radiosurgery Sept 2020--Dr. [name] -01/18/20 MR brain--mass is stable in size as compared to the brain MRI of 09/13/2019 -no focal deficits on exam     Discharge Instructions   Allergies as of 02/14/2020      Reactions   Acetaminophen Other (See Comments)   Has fatty deposits on liver   Benadryl [diphenhydramine Hcl] Other (See Comments)   hyperactivity and seizures   Codeine Itching, Rash   seizures   Dilantin [phenytoin Sodium Extended] Other (See Comments)   Elevated LFT's   Melatonin Other (See Comments)   seizures   Tramadol Other (See Comments)   "causes seizures"   Ultram [tramadol Hcl] Other (See Comments)   Seizures   Vimpat [lacosamide] Other (See Comments)   Severe dizziness   Mirtazapine Other (See Comments)   Potentiated seizure activity   Betadine  [  povidone Iodine] Rash   Latex Rash   Penicillins Itching, Rash   Has patient had a PCN reaction causing immediate rash, facial/tongue/throat swelling, SOB or lightheadedness with hypotension: No Has patient had a PCN reaction causing severe rash involving mucus membranes or skin necrosis: No Has patient had a PCN reaction that required hospitalization No Has patient had a PCN reaction occurring within the last 10 years: No If all of the above answers are "NO", then may proceed with Cephalosporin use.   Sulfa Antibiotics Rash   Tape Rash, Other (See Comments)   Paper tape please Paper tape please   Vicodin [hydrocodone-acetaminophen] Itching      Medication List    STOP taking these medications   dexamethasone 4 MG tablet Commonly known as: DECADRON   predniSONE 20 MG tablet Commonly known as: DELTASONE     TAKE these medications   azithromycin 500 MG tablet Commonly known as: ZITHROMAX Take 1 tablet (500 mg total) by mouth daily.   benzonatate 100 MG capsule Commonly known as: TESSALON Take 100 mg by mouth 3 (three) times daily as needed.   cefdinir 300 MG capsule Commonly known as: OMNICEF Take 1 capsule (300 mg total) by mouth every 12 (twelve) hours. What changed: additional instructions   FLUoxetine 40 MG capsule Commonly known as: PROZAC Take 2 capsules (80 mg total) by mouth daily.   gabapentin 600 MG tablet Commonly known as: NEURONTIN TAKE 1 TABLET BY MOUTH THREE TIMES DAILY What changed:   how much to take  how to take this  when to take this   lamoTRIgine 150 MG tablet Commonly known as: LAMICTAL Take 1 tablet (150 mg total) by mouth 2 (two) times daily. For mood control   levETIRAcetam 1000 MG tablet Commonly known as: KEPPRA Take 1 in the morning and 1 tablet at bedtime. What changed:   how much to take  how to take this  when to take this  additional instructions   LORazepam 1 MG tablet Commonly known as: ATIVAN Take 1 tablet (1  mg total) by mouth daily as needed for anxiety.   pramipexole 0.5 MG tablet Commonly known as: MIRAPEX Take 1 tablet (0.5 mg total) by mouth 2 (two) times daily.            Durable Medical Equipment  (From admission, onward)         Start     Ordered   02/14/20 1119  For home use only DME 4 wheeled rolling walker with seat  Once       Question:  Patient needs a walker to treat with the following condition  Answer:  Gait instability   02/14/20 1118   02/14/20 1119  For home use only DME Shower stool  Once        02/14/20 1118          Follow-up Information    [facility] Follow [facility].   Specialty: [place] Why: [place] staff will call you to schedule in home nursing and physical therapy visits             Allergies  Allergen Reactions  . Acetaminophen Other (See Comments)    Has fatty deposits on liver  . Benadryl [Diphenhydramine Hcl] Other (See Comments)    hyperactivity and seizures  . Codeine Itching and Rash    seizures  . Dilantin [Phenytoin Sodium Extended] Other (See Comments)    Elevated LFT's  . Melatonin Other (See Comments)  seizures  . Tramadol Other (See Comments)    "causes seizures"  . Ultram [Tramadol Hcl] Other (See Comments)    Seizures  . Vimpat [Lacosamide] Other (See Comments)    Severe dizziness  . Mirtazapine Other (See Comments)    Potentiated seizure activity  . Betadine [Povidone Iodine] Rash  . Latex Rash  . Penicillins Itching and Rash    Has patient had a PCN reaction causing immediate rash, facial/tongue/throat swelling, SOB or lightheadedness with hypotension: No Has patient had a PCN reaction causing severe rash involving mucus membranes or skin necrosis: No Has patient had a PCN reaction that required hospitalization No Has patient had a PCN reaction occurring within the last 10 years: No If all of the above answers are "NO", then may proceed with Cephalosporin use.  . Sulfa  Antibiotics Rash  . Tape Rash and Other (See Comments)    Paper tape please Paper tape please  . Vicodin [Hydrocodone-Acetaminophen] Itching    Consultations:  none   Procedures/Studies: CT Head Wo Contrast  Result Date: 02/12/2020 CLINICAL DATA:  Head trauma, fell yesterday striking head, loss of consciousness, complaining of dizziness, history of seizures EXAM: CT HEAD WITHOUT CONTRAST TECHNIQUE: Contiguous axial images were obtained from the base of the skull through the vertex without intravenous contrast. COMPARISON:  06/22/2019 FINDINGS: Brain: Normal ventricular morphology. No midline shift or mass effect. Normal appearance of brain parenchyma. No intracranial hemorrhage, mass lesion, or evidence of acute infarction. No extra-axial fluid collections. Vascular: No hyperdense vessels. Minimal atherosclerotic calcification within internal carotid arteries at skull base Skull: Intact Sinuses/Orbits: Opacified RIGHT middle ear cavity. Mucosal thickening throughout the paranasal sinuses with small fluid levels in the maxillary and frontal sinuses. Other: N/A IMPRESSION: No acute intracranial abnormalities. Scattered sinus disease changes as above. Opacified RIGHT middle ear cavity new since previous exam, recommend correlation with physical exam. Electronically Signed   By: Lavonia Dana M.D.   On: 02/12/2020 16:15   MR BRAIN W WO CONTRAST  Result Date: 01/18/2020 CLINICAL DATA:  Meningioma. Brain/CNS neoplasm, assess treatment response. Additional history provided by scanning technologist: Patient reports feeling "off balance " for several weeks. EXAM: MRI HEAD WITHOUT AND WITH CONTRAST TECHNIQUE: Multiplanar, multiecho pulse sequences of the brain and surrounding structures were obtained without and with intravenous contrast. CONTRAST:  61mL GADAVIST GADOBUTROL 1 MMOL/ML IV SOLN COMPARISON:  Prior brain MRI 09/13/2019. MRI brain/orbits 08/20/2019. FINDINGS: Brain: Mild intermittent motion  degradation. Cerebral volume is normal for age. Redemonstrated avidly enhancing mass expanding the left cavernous sinus and extending inferiorly to fill Meckel's cave. As before, the dominant component of the mass within the left cavernous sinus measures 2.4 x 1.2 cm in transaxial dimensions. Also unchanged, a 0.4 x 0.6 cm pedunculated component of the tumor extends into the left prepontine cistern with slight mass effect upon the pons. Stable extension of tumor along the adjacent anterior left tentorium. There is no acute infarct. No chronic intracranial blood products. No extra-axial fluid collection. No midline shift. Vascular: Expected proximal arterial flow voids. Skull and upper cervical spine: No focal marrow lesion. Sinuses/Orbits: Visualized orbits show no acute finding. Frothy secretions within the left frontal sinus. There is severe bilateral ethmoid sinus mucosal thickening. Moderate mucosal thickening within the bilateral sphenoid sinuses. Moderate/severe mucosal thickening within the right maxillary sinus. Mild mucosal thickening within the left maxillary sinus. IMPRESSION: Mildly motion degraded exam. Redemonstrated enhancing mass consistent with meningioma centered within the left cavernous sinus and extending into left Meckel's cave, into  the left prepontine cistern and along the anterior left tentorium. The mass is stable in size as compared to the brain MRI of 09/13/2019. As before, there is minimal mass effect upon the pons. Progressive paranasal sinus disease as described. Electronically Signed   By: Kellie Simmering DO   On: 01/18/2020 10:25   CT ABDOMEN PELVIS W CONTRAST  Result Date: 02/12/2020 CLINICAL DATA:  Abdominal trauma after fall, flank ecchymosis. Patient reports chronic lower abdominal pain. EXAM: CT ABDOMEN AND PELVIS WITH CONTRAST TECHNIQUE: Multidetector CT imaging of the abdomen and pelvis was performed using the standard protocol following bolus administration of intravenous  contrast. CONTRAST:  48mL OMNIPAQUE IOHEXOL 300 MG/ML  SOLN COMPARISON:  Noncontrast CT 11/29/2017 FINDINGS: Lower chest: Right lower lobe consolidation with internal air bronchograms, series 4, image 7. Dependent opacity in the left lower lobe. Upper normal heart size. Hepatobiliary: Mild diffusely decreased hepatic density consistent with steatosis. No focal hepatic abnormality. No evidence of hepatic injury or perihepatic hematoma. Gallbladder physiologically distended, no calcified stone. No biliary dilatation. Pancreas: No acute finding. No ductal dilatation. Stable fullness in the pancreatic head. No discrete pancreatic mass. Spleen: Normal in size. No focal abnormality. No evidence of splenic injury or perisplenic hematoma. Adrenals/Urinary Tract: No adrenal nodule or hemorrhage. No renal injury. No hydronephrosis. Bilateral nonobstructing renal calculi. No focal renal lesion. Unremarkable urinary bladder. Stomach/Bowel: Decompressed stomach, motion through the upper abdomen limits detailed assessment. No bowel obstruction, inflammation, or evident wall thickening. No evidence of bowel injury. Moderately large stool burden throughout the colon. Low lying cecum in the deep pelvis. Appendix not confidently visualized. No appendicitis. No mesenteric hematoma. Vascular/Lymphatic: Aortic and branch atherosclerosis. No acute vascular findings are vascular injury. No retroperitoneal fluid. No enlarged lymph nodes in the abdomen or pelvis. Probable calcified node abutting the ascending colon. Reproductive: Hysterectomy. 2.7 cm left ovarian/adnexal cyst. This is not significantly changed from prior exam. Other: No free air or free fluid. No confluent soft tissue hematoma. Musculoskeletal: Left hip arthroplasty. No acute fracture of the pelvis or lumbar spine. Multilevel lumbar spondylosis. IMPRESSION: 1. No acute traumatic injury to the abdomen or pelvis. 2. Right lower lobe consolidation with internal air  bronchograms, suspicious for pneumonia. Dependent opacity in the left lower lobe favors atelectasis. 3. Bilateral nonobstructing renal calculi. 4. Mild hepatic steatosis. 5. Stable 2.7 cm left ovarian/adnexal cyst. No follow-up imaging recommended. Note: This recommendation does not apply to premenarchal patients and to those with increased risk (genetic, family history, elevated tumor markers or other high-risk factors) of ovarian cancer. Reference: JACR 2020 Feb; 17(2):248-254 Aortic Atherosclerosis (ICD10-I70.0). Electronically Signed   By: Keith Rake M.D.   On: 02/12/2020 17:36   DG Chest Portable 1 View  Result Date: 02/12/2020 CLINICAL DATA:  Cough and shortness of breath EXAM: PORTABLE CHEST 1 VIEW COMPARISON:  03/03/2017 FINDINGS: Linear bandlike density above the right hemidiaphragm favors atelectasis given the configuration, less likely to be related to pneumonia. Old healed fracture of the right lateral seventh rib. Thoracic spondylosis. Heart size within normal limits for projection. The left lung appears clear. No blunting of the costophrenic angles. IMPRESSION: 1. Linear bandlike density above the right hemidiaphragm favors atelectasis over early pneumonia. Electronically Signed   By: Van Clines M.D.   On: 02/12/2020 15:59        Discharge Exam: Vitals:   02/14/20 0445 02/14/20 0900  BP: 102/76 101/72  Pulse: 80 75  Resp: 16 16  Temp: 98.5 F (36.9 C) 98.2 F (36.8 C)  SpO2: 98% 99%   Vitals:   02/13/20 2137 02/14/20 0130 02/14/20 0445 02/14/20 0900  BP: 118/85 105/67 102/76 101/72  Pulse: 86 79 80 75  Resp: 16 18 16 16   Temp: 98.1 F (36.7 C) 98.5 F (36.9 C) 98.5 F (36.9 C) 98.2 F (36.8 C)  TempSrc: Oral Oral Oral Oral  SpO2: 99% 97% 98% 99%  Weight:      Height:        General: Pt is alert, awake, not in acute distress Cardiovascular: RRR, S1/S2 +, no rubs, no gallops Respiratory: CTA bilaterally, no wheezing, no rhonchi Abdominal: Soft, NT,  ND, bowel sounds + Extremities: no edema, no cyanosis   The results of significant diagnostics from this hospitalization (including imaging, microbiology, ancillary and laboratory) are listed below for reference.    Significant Diagnostic Studies: CT Head Wo Contrast  Result Date: 02/12/2020 CLINICAL DATA:  Head trauma, fell yesterday striking head, loss of consciousness, complaining of dizziness, history of seizures EXAM: CT HEAD WITHOUT CONTRAST TECHNIQUE: Contiguous axial images were obtained from the base of the skull through the vertex without intravenous contrast. COMPARISON:  06/22/2019 FINDINGS: Brain: Normal ventricular morphology. No midline shift or mass effect. Normal appearance of brain parenchyma. No intracranial hemorrhage, mass lesion, or evidence of acute infarction. No extra-axial fluid collections. Vascular: No hyperdense vessels. Minimal atherosclerotic calcification within internal carotid arteries at skull base Skull: Intact Sinuses/Orbits: Opacified RIGHT middle ear cavity. Mucosal thickening throughout the paranasal sinuses with small fluid levels in the maxillary and frontal sinuses. Other: N/A IMPRESSION: No acute intracranial abnormalities. Scattered sinus disease changes as above. Opacified RIGHT middle ear cavity new since previous exam, recommend correlation with physical exam. Electronically Signed   By: Lavonia Dana M.D.   On: 02/12/2020 16:15   MR BRAIN W WO CONTRAST  Result Date: 01/18/2020 CLINICAL DATA:  Meningioma. Brain/CNS neoplasm, assess treatment response. Additional history provided by scanning technologist: Patient reports feeling "off balance " for several weeks. EXAM: MRI HEAD WITHOUT AND WITH CONTRAST TECHNIQUE: Multiplanar, multiecho pulse sequences of the brain and surrounding structures were obtained without and with intravenous contrast. CONTRAST:  32mL GADAVIST GADOBUTROL 1 MMOL/ML IV SOLN COMPARISON:  Prior brain MRI 09/13/2019. MRI brain/orbits  08/20/2019. FINDINGS: Brain: Mild intermittent motion degradation. Cerebral volume is normal for age. Redemonstrated avidly enhancing mass expanding the left cavernous sinus and extending inferiorly to fill Meckel's cave. As before, the dominant component of the mass within the left cavernous sinus measures 2.4 x 1.2 cm in transaxial dimensions. Also unchanged, a 0.4 x 0.6 cm pedunculated component of the tumor extends into the left prepontine cistern with slight mass effect upon the pons. Stable extension of tumor along the adjacent anterior left tentorium. There is no acute infarct. No chronic intracranial blood products. No extra-axial fluid collection. No midline shift. Vascular: Expected proximal arterial flow voids. Skull and upper cervical spine: No focal marrow lesion. Sinuses/Orbits: Visualized orbits show no acute finding. Frothy secretions within the left frontal sinus. There is severe bilateral ethmoid sinus mucosal thickening. Moderate mucosal thickening within the bilateral sphenoid sinuses. Moderate/severe mucosal thickening within the right maxillary sinus. Mild mucosal thickening within the left maxillary sinus. IMPRESSION: Mildly motion degraded exam. Redemonstrated enhancing mass consistent with meningioma centered within the left cavernous sinus and extending into left Meckel's cave, into the left prepontine cistern and along the anterior left tentorium. The mass is stable in size as compared to the brain MRI of 09/13/2019. As before, there is minimal mass effect  upon the pons. Progressive paranasal sinus disease as described. Electronically Signed   By: Kellie Simmering DO   On: 01/18/2020 10:25   CT ABDOMEN PELVIS W CONTRAST  Result Date: 02/12/2020 CLINICAL DATA:  Abdominal trauma after fall, flank ecchymosis. Patient reports chronic lower abdominal pain. EXAM: CT ABDOMEN AND PELVIS WITH CONTRAST TECHNIQUE: Multidetector CT imaging of the abdomen and pelvis was performed using the standard  protocol following bolus administration of intravenous contrast. CONTRAST:  41mL OMNIPAQUE IOHEXOL 300 MG/ML  SOLN COMPARISON:  Noncontrast CT 11/29/2017 FINDINGS: Lower chest: Right lower lobe consolidation with internal air bronchograms, series 4, image 7. Dependent opacity in the left lower lobe. Upper normal heart size. Hepatobiliary: Mild diffusely decreased hepatic density consistent with steatosis. No focal hepatic abnormality. No evidence of hepatic injury or perihepatic hematoma. Gallbladder physiologically distended, no calcified stone. No biliary dilatation. Pancreas: No acute finding. No ductal dilatation. Stable fullness in the pancreatic head. No discrete pancreatic mass. Spleen: Normal in size. No focal abnormality. No evidence of splenic injury or perisplenic hematoma. Adrenals/Urinary Tract: No adrenal nodule or hemorrhage. No renal injury. No hydronephrosis. Bilateral nonobstructing renal calculi. No focal renal lesion. Unremarkable urinary bladder. Stomach/Bowel: Decompressed stomach, motion through the upper abdomen limits detailed assessment. No bowel obstruction, inflammation, or evident wall thickening. No evidence of bowel injury. Moderately large stool burden throughout the colon. Low lying cecum in the deep pelvis. Appendix not confidently visualized. No appendicitis. No mesenteric hematoma. Vascular/Lymphatic: Aortic and branch atherosclerosis. No acute vascular findings are vascular injury. No retroperitoneal fluid. No enlarged lymph nodes in the abdomen or pelvis. Probable calcified node abutting the ascending colon. Reproductive: Hysterectomy. 2.7 cm left ovarian/adnexal cyst. This is not significantly changed from prior exam. Other: No free air or free fluid. No confluent soft tissue hematoma. Musculoskeletal: Left hip arthroplasty. No acute fracture of the pelvis or lumbar spine. Multilevel lumbar spondylosis. IMPRESSION: 1. No acute traumatic injury to the abdomen or pelvis. 2. Right  lower lobe consolidation with internal air bronchograms, suspicious for pneumonia. Dependent opacity in the left lower lobe favors atelectasis. 3. Bilateral nonobstructing renal calculi. 4. Mild hepatic steatosis. 5. Stable 2.7 cm left ovarian/adnexal cyst. No follow-up imaging recommended. Note: This recommendation does not apply to premenarchal patients and to those with increased risk (genetic, family history, elevated tumor markers or other high-risk factors) of ovarian cancer. Reference: JACR 2020 Feb; 17(2):248-254 Aortic Atherosclerosis (ICD10-I70.0). Electronically Signed   By: Keith Rake M.D.   On: 02/12/2020 17:36   DG Chest Portable 1 View  Result Date: 02/12/2020 CLINICAL DATA:  Cough and shortness of breath EXAM: PORTABLE CHEST 1 VIEW COMPARISON:  03/03/2017 FINDINGS: Linear bandlike density above the right hemidiaphragm favors atelectasis given the configuration, less likely to be related to pneumonia. Old healed fracture of the right lateral seventh rib. Thoracic spondylosis. Heart size within normal limits for projection. The left lung appears clear. No blunting of the costophrenic angles. IMPRESSION: 1. Linear bandlike density above the right hemidiaphragm favors atelectasis over early pneumonia. Electronically Signed   By: Van Clines M.D.   On: 02/12/2020 15:59     Microbiology: Recent Results (from the past 240 hour(s))  SARS Coronavirus 2 by RT PCR (hospital order, performed in [facility] hospital lab) Nasopharyngeal Nasopharyngeal Swab     Status: None   Collection Time: 02/12/20  3:31 PM   Specimen: Nasopharyngeal Swab  Result Value Ref Range Status   SARS Coronavirus 2 NEGATIVE NEGATIVE Final    Comment: (NOTE) SARS-CoV-2  target nucleic acids are NOT DETECTED.  The SARS-CoV-2 RNA is generally detectable in upper and lower respiratory specimens during the acute phase of infection. The lowest concentration of SARS-CoV-2 viral copies this assay can detect is  250 copies / mL. A negative result does not preclude SARS-CoV-2 infection and should not be used as the sole basis for treatment or other patient management decisions.  A negative result may occur with improper specimen collection / handling, submission of specimen other than nasopharyngeal swab, presence of viral mutation(s) within the areas targeted by this assay, and inadequate number of viral copies (<250 copies / mL). A negative result must be combined with clinical observations, patient history, and epidemiological information.  Fact Sheet for Patients:   StrictlyIdeas.no  Fact Sheet for Healthcare Providers: BankingDealers.co.za  This test is not yet approved or  cleared by the [place] FDA and has been authorized for detection and/or diagnosis of SARS-CoV-2 by FDA under an Emergency Use Authorization (EUA).  This EUA will remain in effect (meaning this test can be used) for the duration of the COVID-19 declaration under Section 564(b)(1) of the Act, 21 U.S.C. section 360bbb-3(b)(1), unless the authorization is terminated or revoked sooner.  Performed at [facility], [address], [place], [place] [address]      Labs: Basic Metabolic Panel: Recent Labs  Lab 02/12/20 1530 02/12/20 2134 02/13/20 0519 02/14/20 0410  NA 138  --  139 140  K 3.7  --  3.5 3.5  CL 106  --  109 109  CO2 23  --  23 25  GLUCOSE 90  --  90 86  BUN 17  --  12 6  CREATININE 0.64 0.65 0.52 0.43*  CALCIUM 8.7*  --  8.2* 8.0*   Liver Function Tests: Recent Labs  Lab 02/12/20 1530 02/14/20 0410  AST 89* 77*  ALT 69* 58*  ALKPHOS 104 87  BILITOT 1.1 0.9  PROT 6.8 5.7*  ALBUMIN 3.2* 2.6*   No results for input(s): LIPASE, AMYLASE in the last 168 hours. Recent Labs  Lab 02/12/20 1533 02/13/20 1001  AMMONIA 53* 24   CBC: Recent Labs  Lab 02/12/20 1530 02/12/20 2134 02/13/20 0519 02/14/20 0410  WBC 11.7* 10.2 7.8 5.9   NEUTROABS 8.5*  --  5.4  --   HGB 12.0 11.0* 10.8* 10.5*  HCT 36.9 34.1* 33.9* 32.3*  MCV 91.3 93.4 92.4 92.3  PLT 314 253 223 216   Cardiac Enzymes: No results for input(s): CKTOTAL, CKMB, CKMBINDEX, TROPONINI in the last 168 hours. BNP: Invalid input(s): POCBNP CBG: No results for input(s): GLUCAP in the last 168 hours.  Time coordinating discharge:  36 minutes  Signed:  Orson Eva, DO [facility] Pager: [phone] 02/14/2020, 11:50 AM

## 2020-02-14 NOTE — Progress Notes (Signed)
Physical Therapy Treatment Patient Details Name: [name] MRN: [id number] DOB: March 22, 1965 Today's Date: 02/14/2020    History of Present Illness [name] is a 55 y.o. female with medical history significant of mental delay, epilepsy, recently diagnosed with a UTI in the ED here sent home however has not gotten her medicines refilled. She has been having cough and not feeling well and having frequent falls.  Patient found to have possible pneumonia on chest x-ray O2 sats are normal on room air.    PT Comments    Pt agreeable to ambulation and exercise. Pt cued for motor control to decrease momentum with seated BLE strengthening exercises. Pt ambulates in hallway, declines RW or SPC this session wanting to trial without AD despite therapist education. Pt with narrow BOS taking short choppy steps, decreased trunk rotation and UE arm swing. Little carryover with cues for increased step length and widening BOS so possibly baseline. Pt able to complete turns without LOB. Will continue to educate on benefit of AD for endurance and balance. Pt will benefit from continued physical therapy in hospital and recommendations below to increase strength, balance, endurance for safe ADLs and gait.    Follow Up Recommendations  Home health PT;Supervision - Intermittent     Equipment Recommendations  Other (comment) Facilities manager)    Recommendations for Other Services       Precautions / Restrictions Precautions Precautions: Fall Restrictions Weight Bearing Restrictions: No    Mobility  Bed Mobility Overal bed mobility: Modified Independent     Transfers Overall transfer level: Needs assistance Equipment used: None Transfers: Sit to/from [employer] Transfers Sit to Stand: Supervision Stand pivot transfers: Min guard    General transfer comment: slightly unsteady upon rising improves with cues to widen BOS, min G to pivot over to chair with hands on bed/chair to steady  self  Ambulation/Gait Ambulation/Gait assistance: Min guard Gait Distance (Feet): 150 Feet Assistive device: None Gait Pattern/deviations: Step-through pattern;Decreased stride length;Narrow base of support Gait velocity: decreased   General Gait Details: narrow BOS with short steps, declined use of RW/SPV requiring min G, decreased trunk rotation and bil UE arm swing throughout gait cycle, no SOB noted   Stairs             Wheelchair Mobility    Modified Rankin (Stroke Patients Only)       Balance Overall balance assessment: Needs assistance Sitting-balance support: No upper extremity supported Sitting balance-Leahy Scale: Good Sitting balance - Comments: seated at EOB   Standing balance support: During functional activity;No upper extremity supported Standing balance-Leahy Scale: Fair Standing balance comment: no AD and slightly unsteady, declines RW/SPC this session         Cognition Arousal/Alertness: Awake/alert Behavior During Therapy: WFL for tasks assessed/performed Overall Cognitive Status: Within Functional Limits for tasks assessed       Exercises General Exercises - Lower Extremity Long Arc Quad: Seated;AROM;Strengthening;Both;15 reps Hip Flexion/Marching: Seated;AROM;Strengthening;Both;15 reps    General Comments General comments (skin integrity, edema, etc.): SpO2 98% on RA with ambulation, BP 112/82 and HR 82      Pertinent Vitals/Pain Pain Assessment: No/denies pain    Home Living                      Prior Function            PT Goals (current goals can now be found in the care plan section) Acute Rehab PT Goals Patient Stated Goal: return home  with family to assist PT Goal Formulation: With patient Time For Goal Achievement: 02/17/20 Potential to Achieve Goals: Good Progress towards PT goals: Progressing toward goals    Frequency    Min 3X/week      PT Plan Current plan remains appropriate    Co-evaluation               AM-PAC PT "6 Clicks" Mobility   Outcome Measure  Help needed turning from your back to your side while in a flat bed without using bedrails?: None Help needed moving from lying on your back to sitting on the side of a flat bed without using bedrails?: None Help needed moving to and from a bed to a chair (including a wheelchair)?: A Little Help needed standing up from a chair using your arms (e.g., wheelchair or bedside chair)?: A Little Help needed to walk in hospital room?: A Little Help needed climbing 3-5 steps with a railing? : A Little 6 Click Score: 20    End of Session   Activity Tolerance: Patient tolerated treatment well Patient left: in chair;with call bell/phone within reach Nurse Communication: Mobility status PT Visit Diagnosis: Unsteadiness on feet (R26.81);Other abnormalities of gait and mobility (R26.89);Muscle weakness (generalized) (M62.81)     Time: [id number] PT Time Calculation (min) (ACUTE ONLY): 20 min  Charges:  $Gait Training: 8-22 mins                      Tori Kamauri Denardo PT, DPT 02/14/20, 11:41 AM

## 2020-02-15 DIAGNOSIS — E7220 Disorder of urea cycle metabolism, unspecified: Secondary | ICD-10-CM | POA: Diagnosis not present

## 2020-02-15 DIAGNOSIS — G3184 Mild cognitive impairment, so stated: Secondary | ICD-10-CM | POA: Diagnosis not present

## 2020-02-15 DIAGNOSIS — E7800 Pure hypercholesterolemia, unspecified: Secondary | ICD-10-CM | POA: Diagnosis not present

## 2020-02-15 DIAGNOSIS — R69 Illness, unspecified: Secondary | ICD-10-CM | POA: Diagnosis not present

## 2020-02-15 DIAGNOSIS — I951 Orthostatic hypotension: Secondary | ICD-10-CM | POA: Diagnosis not present

## 2020-02-15 DIAGNOSIS — J440 Chronic obstructive pulmonary disease with acute lower respiratory infection: Secondary | ICD-10-CM | POA: Diagnosis not present

## 2020-02-15 DIAGNOSIS — J181 Lobar pneumonia, unspecified organism: Secondary | ICD-10-CM | POA: Diagnosis not present

## 2020-02-15 DIAGNOSIS — G40201 Localization-related (focal) (partial) symptomatic epilepsy and epileptic syndromes with complex partial seizures, not intractable, with status epilepticus: Secondary | ICD-10-CM | POA: Diagnosis not present

## 2020-02-15 DIAGNOSIS — K760 Fatty (change of) liver, not elsewhere classified: Secondary | ICD-10-CM | POA: Diagnosis not present

## 2020-02-16 ENCOUNTER — Telehealth: Payer: Self-pay

## 2020-02-16 NOTE — Telephone Encounter (Signed)
Yes- please give verbal orders!

## 2020-02-16 NOTE — Telephone Encounter (Signed)
Tina with [facility] is requesting -verbal orders to start  Pt.'s care. -medical social worker to see if there is any community resources that can help pt out in her home.   Tina [phone]. Ok to leave voicemail.

## 2020-02-16 NOTE — Telephone Encounter (Cosign Needed)
Transition Care Management Unsuccessful Follow-up Telephone Call  Date of discharge and from where:  02/14/20  Attempts:  2nd Attempt  Reason for unsuccessful TCM follow-up call:  No answer/busy

## 2020-02-16 NOTE — Telephone Encounter (Signed)
Verbal orders given to Tina. °

## 2020-02-16 NOTE — Telephone Encounter (Signed)
Please advise 

## 2020-02-17 ENCOUNTER — Telehealth: Payer: Self-pay

## 2020-02-17 DIAGNOSIS — G3184 Mild cognitive impairment, so stated: Secondary | ICD-10-CM | POA: Diagnosis not present

## 2020-02-17 DIAGNOSIS — J440 Chronic obstructive pulmonary disease with acute lower respiratory infection: Secondary | ICD-10-CM | POA: Diagnosis not present

## 2020-02-17 DIAGNOSIS — K760 Fatty (change of) liver, not elsewhere classified: Secondary | ICD-10-CM | POA: Diagnosis not present

## 2020-02-17 DIAGNOSIS — I951 Orthostatic hypotension: Secondary | ICD-10-CM | POA: Diagnosis not present

## 2020-02-17 DIAGNOSIS — J181 Lobar pneumonia, unspecified organism: Secondary | ICD-10-CM | POA: Diagnosis not present

## 2020-02-17 DIAGNOSIS — G40201 Localization-related (focal) (partial) symptomatic epilepsy and epileptic syndromes with complex partial seizures, not intractable, with status epilepticus: Secondary | ICD-10-CM | POA: Diagnosis not present

## 2020-02-17 DIAGNOSIS — R69 Illness, unspecified: Secondary | ICD-10-CM | POA: Diagnosis not present

## 2020-02-17 DIAGNOSIS — E7800 Pure hypercholesterolemia, unspecified: Secondary | ICD-10-CM | POA: Diagnosis not present

## 2020-02-17 DIAGNOSIS — E7220 Disorder of urea cycle metabolism, unspecified: Secondary | ICD-10-CM | POA: Diagnosis not present

## 2020-02-17 NOTE — Telephone Encounter (Cosign Needed)
Transition Care Management Unsuccessful Follow-up Telephone Call  Date of discharge and from where:  02/14/20  Attempts:  3rd Attempt  Reason for unsuccessful TCM follow-up call:  Left voice message with family member they stated she was in physical therapy. I encouraged him to have her call for HFU,

## 2020-02-20 ENCOUNTER — Emergency Department (HOSPITAL_COMMUNITY): Payer: Medicare HMO

## 2020-02-20 ENCOUNTER — Encounter (HOSPITAL_COMMUNITY): Payer: Self-pay | Admitting: Emergency Medicine

## 2020-02-20 ENCOUNTER — Other Ambulatory Visit: Payer: Self-pay

## 2020-02-20 ENCOUNTER — Inpatient Hospital Stay (HOSPITAL_COMMUNITY)
Admission: EM | Admit: 2020-02-20 | Discharge: 2020-02-24 | DRG: 642 | Disposition: A | Discharge status: Home / Self Care | Payer: Medicare HMO | Attending: Internal Medicine | Admitting: Internal Medicine

## 2020-02-20 DIAGNOSIS — Z91040 Latex allergy status: Secondary | ICD-10-CM

## 2020-02-20 DIAGNOSIS — Z888 Allergy status to other drugs, medicaments and biological substances status: Secondary | ICD-10-CM

## 2020-02-20 DIAGNOSIS — J440 Chronic obstructive pulmonary disease with acute lower respiratory infection: Secondary | ICD-10-CM | POA: Diagnosis not present

## 2020-02-20 DIAGNOSIS — R069 Unspecified abnormalities of breathing: Secondary | ICD-10-CM | POA: Diagnosis not present

## 2020-02-20 DIAGNOSIS — Z96651 Presence of right artificial knee joint: Secondary | ICD-10-CM | POA: Diagnosis present

## 2020-02-20 DIAGNOSIS — Z91048 Other nonmedicinal substance allergy status: Secondary | ICD-10-CM

## 2020-02-20 DIAGNOSIS — W1830XA Fall on same level, unspecified, initial encounter: Secondary | ICD-10-CM | POA: Diagnosis present

## 2020-02-20 DIAGNOSIS — Y92007 Garden or yard of unspecified non-institutional (private) residence as the place of occurrence of the external cause: Secondary | ICD-10-CM

## 2020-02-20 DIAGNOSIS — K760 Fatty (change of) liver, not elsewhere classified: Secondary | ICD-10-CM | POA: Diagnosis not present

## 2020-02-20 DIAGNOSIS — R569 Unspecified convulsions: Secondary | ICD-10-CM | POA: Diagnosis not present

## 2020-02-20 DIAGNOSIS — E7220 Disorder of urea cycle metabolism, unspecified: Secondary | ICD-10-CM | POA: Diagnosis not present

## 2020-02-20 DIAGNOSIS — I951 Orthostatic hypotension: Secondary | ICD-10-CM | POA: Diagnosis not present

## 2020-02-20 DIAGNOSIS — Z885 Allergy status to narcotic agent status: Secondary | ICD-10-CM

## 2020-02-20 DIAGNOSIS — R69 Illness, unspecified: Secondary | ICD-10-CM | POA: Diagnosis not present

## 2020-02-20 DIAGNOSIS — W19XXXA Unspecified fall, initial encounter: Secondary | ICD-10-CM

## 2020-02-20 DIAGNOSIS — K7290 Hepatic failure, unspecified without coma: Secondary | ICD-10-CM | POA: Diagnosis present

## 2020-02-20 DIAGNOSIS — E7800 Pure hypercholesterolemia, unspecified: Secondary | ICD-10-CM | POA: Diagnosis not present

## 2020-02-20 DIAGNOSIS — R4182 Altered mental status, unspecified: Secondary | ICD-10-CM

## 2020-02-20 DIAGNOSIS — G40209 Localization-related (focal) (partial) symptomatic epilepsy and epileptic syndromes with complex partial seizures, not intractable, without status epilepticus: Secondary | ICD-10-CM | POA: Diagnosis present

## 2020-02-20 DIAGNOSIS — R296 Repeated falls: Secondary | ICD-10-CM | POA: Diagnosis present

## 2020-02-20 DIAGNOSIS — Z880 Allergy status to penicillin: Secondary | ICD-10-CM

## 2020-02-20 DIAGNOSIS — R270 Ataxia, unspecified: Secondary | ICD-10-CM | POA: Diagnosis not present

## 2020-02-20 DIAGNOSIS — R531 Weakness: Secondary | ICD-10-CM

## 2020-02-20 DIAGNOSIS — G3184 Mild cognitive impairment, so stated: Secondary | ICD-10-CM | POA: Diagnosis not present

## 2020-02-20 DIAGNOSIS — R441 Visual hallucinations: Secondary | ICD-10-CM | POA: Diagnosis present

## 2020-02-20 DIAGNOSIS — Z8674 Personal history of sudden cardiac arrest: Secondary | ICD-10-CM

## 2020-02-20 DIAGNOSIS — K7682 Hepatic encephalopathy: Secondary | ICD-10-CM | POA: Diagnosis present

## 2020-02-20 DIAGNOSIS — R7401 Elevation of levels of liver transaminase levels: Secondary | ICD-10-CM

## 2020-02-20 DIAGNOSIS — F32A Depression, unspecified: Secondary | ICD-10-CM | POA: Diagnosis present

## 2020-02-20 DIAGNOSIS — Z886 Allergy status to analgesic agent status: Secondary | ICD-10-CM

## 2020-02-20 DIAGNOSIS — E860 Dehydration: Secondary | ICD-10-CM | POA: Diagnosis present

## 2020-02-20 DIAGNOSIS — G9341 Metabolic encephalopathy: Secondary | ICD-10-CM | POA: Diagnosis present

## 2020-02-20 DIAGNOSIS — Z20822 Contact with and (suspected) exposure to covid-19: Secondary | ICD-10-CM | POA: Diagnosis not present

## 2020-02-20 DIAGNOSIS — Z86011 Personal history of benign neoplasm of the brain: Secondary | ICD-10-CM

## 2020-02-20 DIAGNOSIS — G2581 Restless legs syndrome: Secondary | ICD-10-CM | POA: Diagnosis present

## 2020-02-20 DIAGNOSIS — R42 Dizziness and giddiness: Secondary | ICD-10-CM | POA: Diagnosis not present

## 2020-02-20 DIAGNOSIS — Z96642 Presence of left artificial hip joint: Secondary | ICD-10-CM | POA: Diagnosis present

## 2020-02-20 DIAGNOSIS — J449 Chronic obstructive pulmonary disease, unspecified: Secondary | ICD-10-CM | POA: Diagnosis present

## 2020-02-20 DIAGNOSIS — R4189 Other symptoms and signs involving cognitive functions and awareness: Secondary | ICD-10-CM | POA: Diagnosis present

## 2020-02-20 DIAGNOSIS — Y92009 Unspecified place in unspecified non-institutional (private) residence as the place of occurrence of the external cause: Secondary | ICD-10-CM

## 2020-02-20 DIAGNOSIS — Z818 Family history of other mental and behavioral disorders: Secondary | ICD-10-CM

## 2020-02-20 DIAGNOSIS — R7989 Other specified abnormal findings of blood chemistry: Secondary | ICD-10-CM | POA: Diagnosis present

## 2020-02-20 DIAGNOSIS — R413 Other amnesia: Secondary | ICD-10-CM

## 2020-02-20 DIAGNOSIS — J181 Lobar pneumonia, unspecified organism: Secondary | ICD-10-CM | POA: Diagnosis not present

## 2020-02-20 DIAGNOSIS — G40201 Localization-related (focal) (partial) symptomatic epilepsy and epileptic syndromes with complex partial seizures, not intractable, with status epilepticus: Secondary | ICD-10-CM | POA: Diagnosis not present

## 2020-02-20 DIAGNOSIS — Z79899 Other long term (current) drug therapy: Secondary | ICD-10-CM

## 2020-02-20 LAB — CBC WITH DIFFERENTIAL/PLATELET
Abs Immature Granulocytes: 0.03 10*3/uL (ref 0.00–0.07)
Basophils Absolute: 0 10*3/uL (ref 0.0–0.1)
Basophils Relative: 0 %
Eosinophils Absolute: 0.1 10*3/uL (ref 0.0–0.5)
Eosinophils Relative: 1 %
HCT: 35 % — ABNORMAL LOW (ref 36.0–46.0)
Hemoglobin: 11.2 g/dL — ABNORMAL LOW (ref 12.0–15.0)
Immature Granulocytes: 0 %
Lymphocytes Relative: 21 %
Lymphs Abs: 1.7 10*3/uL (ref 0.7–4.0)
MCH: 29.9 pg (ref 26.0–34.0)
MCHC: 32 g/dL (ref 30.0–36.0)
MCV: 93.6 fL (ref 80.0–100.0)
Monocytes Absolute: 0.6 10*3/uL (ref 0.1–1.0)
Monocytes Relative: 7 %
Neutro Abs: 5.6 10*3/uL (ref 1.7–7.7)
Neutrophils Relative %: 71 %
Platelets: 175 10*3/uL (ref 150–400)
RBC: 3.74 MIL/uL — ABNORMAL LOW (ref 3.87–5.11)
RDW: 15 % (ref 11.5–15.5)
WBC: 8 10*3/uL (ref 4.0–10.5)
nRBC: 0 % (ref 0.0–0.2)

## 2020-02-20 LAB — URINALYSIS, ROUTINE W REFLEX MICROSCOPIC
Bilirubin Urine: NEGATIVE
Glucose, UA: NEGATIVE mg/dL
Hgb urine dipstick: NEGATIVE
Ketones, ur: NEGATIVE mg/dL
Leukocytes,Ua: NEGATIVE
Nitrite: NEGATIVE
Protein, ur: NEGATIVE mg/dL
Specific Gravity, Urine: 1.019 (ref 1.005–1.030)
pH: 5 (ref 5.0–8.0)

## 2020-02-20 LAB — COMPREHENSIVE METABOLIC PANEL
ALT: 96 U/L — ABNORMAL HIGH (ref 0–44)
AST: 155 U/L — ABNORMAL HIGH (ref 15–41)
Albumin: 2.9 g/dL — ABNORMAL LOW (ref 3.5–5.0)
Alkaline Phosphatase: 112 U/L (ref 38–126)
Anion gap: 5 (ref 5–15)
BUN: 9 mg/dL (ref 6–20)
CO2: 27 mmol/L (ref 22–32)
Calcium: 8.9 mg/dL (ref 8.9–10.3)
Chloride: 107 mmol/L (ref 98–111)
Creatinine, Ser: 0.6 mg/dL (ref 0.44–1.00)
GFR, Estimated: >60 mL/min (ref >60)
Glucose, Bld: 83 mg/dL (ref 70–99)
Potassium: 3.9 mmol/L (ref 3.5–5.1)
Sodium: 139 mmol/L (ref 135–145)
Total Bilirubin: 0.9 mg/dL (ref 0.3–1.2)
Total Protein: 6.2 g/dL — ABNORMAL LOW (ref 6.5–8.1)

## 2020-02-20 LAB — PROTIME-INR
INR: 1.1 (ref 0.8–1.2)
Prothrombin Time: 13.5 seconds (ref 11.4–15.2)

## 2020-02-20 MED ORDER — HEPARIN SODIUM (PORCINE) 5000 UNIT/ML IJ SOLN
5000.0000 [IU] | Freq: Three times a day (TID) | INTRAMUSCULAR | Status: DC
  Administered 2020-02-20 – 2020-02-24 (×11): 5000 [IU] via SUBCUTANEOUS
  Filled 2020-02-20 (×11): qty 1

## 2020-02-20 MED ORDER — POLYETHYLENE GLYCOL 3350 17 G PO PACK: 17.0000 g | PACK | Freq: Every day | ORAL | Status: DC | PRN

## 2020-02-20 MED ORDER — SODIUM CHLORIDE 0.9 % IV BOLUS
1000.0000 mL | Freq: Once | INTRAVENOUS | Status: AC
  Administered 2020-02-20: 1000 mL via INTRAVENOUS

## 2020-02-20 MED ORDER — ACETAMINOPHEN 650 MG RE SUPP: 650.0000 mg | Freq: Four times a day (QID) | RECTAL | Status: DC | PRN

## 2020-02-20 MED ORDER — ONDANSETRON HCL 4 MG PO TABS: 4.0000 mg | ORAL_TABLET | Freq: Four times a day (QID) | ORAL | Status: DC | PRN

## 2020-02-20 MED ORDER — LEVETIRACETAM 500 MG PO TABS
1000.0000 mg | ORAL_TABLET | Freq: Two times a day (BID) | ORAL | Status: DC
  Administered 2020-02-20 – 2020-02-24 (×8): 1000 mg via ORAL
  Filled 2020-02-20 (×8): qty 2

## 2020-02-20 MED ORDER — FLUOXETINE HCL 20 MG PO CAPS
80.0000 mg | ORAL_CAPSULE | Freq: Every day | ORAL | Status: DC
  Administered 2020-02-21 – 2020-02-24 (×4): 80 mg via ORAL
  Filled 2020-02-20 (×6): qty 4

## 2020-02-20 MED ORDER — ONDANSETRON HCL 4 MG/2ML IJ SOLN: 4.0000 mg | Freq: Four times a day (QID) | INTRAMUSCULAR | Status: DC | PRN

## 2020-02-20 MED ORDER — LAMOTRIGINE 25 MG PO TABS
150.0000 mg | ORAL_TABLET | Freq: Two times a day (BID) | ORAL | Status: DC
  Administered 2020-02-20 – 2020-02-24 (×8): 150 mg via ORAL
  Filled 2020-02-20 (×5): qty 2
  Filled 2020-02-20: qty 6
  Filled 2020-02-20 (×2): qty 2

## 2020-02-20 MED ORDER — LORAZEPAM 1 MG PO TABS
1.0000 mg | ORAL_TABLET | Freq: Every day | ORAL | Status: DC | PRN
  Administered 2020-02-22 – 2020-02-24 (×3): 1 mg via ORAL
  Filled 2020-02-20 (×3): qty 1

## 2020-02-20 MED ORDER — GABAPENTIN 300 MG PO CAPS
600.0000 mg | ORAL_CAPSULE | Freq: Every day | ORAL | Status: DC
  Administered 2020-02-20: 600 mg via ORAL
  Filled 2020-02-20: qty 2

## 2020-02-20 MED ORDER — PRAMIPEXOLE DIHYDROCHLORIDE 1 MG PO TABS
0.5000 mg | ORAL_TABLET | Freq: Two times a day (BID) | ORAL | Status: DC
  Administered 2020-02-20 – 2020-02-22 (×4): 0.5 mg via ORAL
  Filled 2020-02-20 (×2): qty 1
  Filled 2020-02-20: qty 2
  Filled 2020-02-20: qty 1

## 2020-02-20 MED ORDER — ACETAMINOPHEN 325 MG PO TABS
650.0000 mg | ORAL_TABLET | Freq: Four times a day (QID) | ORAL | Status: DC | PRN
  Administered 2020-02-21 – 2020-02-23 (×2): 650 mg via ORAL
  Filled 2020-02-20 (×2): qty 2

## 2020-02-20 NOTE — ED Notes (Signed)

## 2020-02-20 NOTE — ED Notes (Signed)
This RN attempted IV placement x2 without success.  

## 2020-02-20 NOTE — ED Notes (Signed)
Pt to CT at this time.

## 2020-02-20 NOTE — ED Notes (Signed)
Lab at bedside at this time.  

## 2020-02-20 NOTE — ED Notes (Signed)
Pt gait very unsteady and wobbly. Pt took couple of step and started to fall

## 2020-02-20 NOTE — ED Notes (Signed)
Pt rolled and changed into a clean sheet, gown, chuck pad and draw sheet at this time.  Pt repositioned in bed and provided a clean sheet and warm blanket.

## 2020-02-20 NOTE — Progress Notes (Signed)
Pt arrived to the unit via transport. VS were obtained. Pt oriented to room and call bell placed within reach. Refreshments offered and provided. No distress noted. Will continue to monitor.

## 2020-02-20 NOTE — ED Notes (Signed)
Admitting provider at bedside.

## 2020-02-20 NOTE — ED Triage Notes (Signed)
Pt states she had a seizure, fell hitting her right hip, right arm and head on the sink   EMS states home health nurse stated pt cannot walk,Talking out of her head, sleeping all the time and liver function levels off"

## 2020-02-20 NOTE — ED Notes (Signed)
ED Provider at bedside. 

## 2020-02-20 NOTE — ED Provider Notes (Addendum)
[facility] EMERGENCY DEPARTMENT Provider Note   CSN: [id number] Arrival date & time: 02/20/20  1721     History Chief Complaint  Patient presents with  . Seizures    [name] is a 55 y.o. female.  HPI    55 year old female comes in with chief complaint of weakness.  Although her chief complaint is listed as seizure, patient denies any event.  Patient has history of depression, fatty liver, restless leg syndrome, nonepileptic seizures.  Patient was admitted to the hospital recently for pneumonia.  She reports that once she was discharged, she has had weakness and difficulty walking.  Home nurse went to see her today and advised her to come to the ER because of her weakness and also confusion.  Patient is alert and oriented at the moment.  She denies any new headaches, neck pain, vision change.  She reports that she has known brain tumor for which she has seen oncology service at [facility] long.  She has not had any radiation recently.  I spoke with patient's niece, Ms. Estill Bamberg.  She reported that patient has been having visual hallucinations, which is new for her.  There is no history of substance abuse.  Patient at baseline is able to take care of herself.  More recently, she has had increased weakness and falls.  Past Medical History:  Diagnosis Date  . Arthritis    "knees" (04/22/2012)  . Chronic bronchitis ([place])    "yearly; when the weather changes" (04/22/2012)  . Colon polyp   . Colon polyps    adenomatous and hyperplastic-  . Depression   . Eczema   . Epilepsy ([place])    "been having them right often here lately" (04/22/2012)  . Fatty liver   . High cholesterol   . History of kidney stones   . Osteoarthritis    [name] 04/22/2012  . Other convulsions 05/21/12   non-epileptic spells  . Restless leg   . Seizures ([place])   . Vertigo     Patient Active Problem List   Diagnosis Date Noted  . Acute metabolic encephalopathy 24/40/1027  . Hyperammonemia ([place]) 02/13/2020   . Meningioma ([place]) 09/12/2019  . Benign neoplasm of cerebral meninges ([place]) 09/09/2019  . Anxiety 12/30/2018  . Cardiac arrest ([place]) 12/15/2018  . MDD (major depressive disorder), recurrent episode, mild ([place]) 01/08/2018  . Widowed - July 2019 08/07/2017  . Sleep apnea 06/09/2017  . Colon polyp 06/05/2017  . Elevated liver function tests 06/05/2017  . Fatty liver   . Mild neurocognitive disorder 02/04/2017  . Seizure ([place]) 11/14/2016  . CAP (community acquired pneumonia) 11/09/2016  . 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 Lobar pneumonia ([place]) 11/28/2015  . Partial symptomatic epilepsy with complex partial seizures, not intractable, with status epilepticus ([place]) 07/24/2015  . OA (osteoarthritis) of knee 05/07/2015  . 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 ABDOMINAL HYSTERECTOMY  2001  . BLADDER SUSPENSION    . BUNIONECTOMY Left 2000  . CESAREAN SECTION  1987; 1988  . COLONOSCOPY    . JOINT REPLACEMENT    . MASS EXCISION  10/22/2011   Procedure: EXCISION MASS;  Surgeon: [name], MD;  Location: [place];  Service: General;  Laterality: Right;  excision right buttock mass  . TOTAL HIP ARTHROPLASTY Left 1993; 1995; 2000  . TOTAL KNEE ARTHROPLASTY Left 05/07/2015   Procedure: TOTAL LEFT KNEE ARTHROPLASTY;  Surgeon: [name], MD;  Location: [facility];  Service: Orthopedics;  Laterality: Left;  . TOTAL KNEE ARTHROPLASTY Right 10/01/2015   Procedure: RIGHT TOTAL KNEE ARTHROPLASTY;  Surgeon: [name], MD;  Location: [facility];  Service: Orthopedics;  Laterality: Right;     OB History   No obstetric history on file.     Family History  Problem Relation Age of Onset  . Cancer Mother   . Depression Father   . Cancer Brother   . Diabetes Brother   . Cancer Maternal Aunt   . Diabetes Maternal Aunt   . Bipolar disorder Son   . Drug abuse Son   . Heart attack Sister 11       during severe illness    Social History   Tobacco  Use  . Smoking status: Never Smoker  . Smokeless tobacco: Never Used  Vaping Use  . Vaping Use: Never used  Substance Use Topics  . Alcohol use: No    Alcohol/week: 0.0 standard drinks    Comment: 12-25-2015 per pt no  . Drug use: No    Comment: 21-12-2015 per pt no     Home Medications Prior to Admission medications   Medication Sig Start Date End Date Taking? Authorizing Provider  azithromycin (ZITHROMAX) 500 MG tablet Take 1 tablet (500 mg total) by mouth daily. 02/14/20   Orson Eva, MD  benzonatate (TESSALON) 100 MG capsule Take 100 mg by mouth 3 (three) times daily as needed. 02/08/20   [provider]  cefdinir (OMNICEF) 300 MG capsule Take 1 capsule (300 mg total) by mouth every 12 (twelve) hours. 02/14/20   Orson Eva, MD  FLUoxetine (PROZAC) 40 MG capsule Take 2 capsules (80 mg total) by mouth daily. 01/28/20   Norman Clay, MD  gabapentin (NEURONTIN) 600 MG tablet TAKE 1 TABLET BY MOUTH THREE TIMES DAILY Patient taking differently: at bedtime. 07/05/19   [name], MD  lamoTRIgine (LAMICTAL) 150 MG tablet Take 1 tablet (150 mg total) by mouth 2 (two) times daily. For mood control 12/26/19   Ventura Sellers, MD  levETIRAcetam (KEPPRA) 1000 MG tablet Take 1 in the morning and 1 tablet at bedtime. Patient taking differently: Take 1,000 mg by mouth 2 (two) times daily. 12/26/19   Vaslow, Acey Lav, MD  LORazepam (ATIVAN) 1 MG tablet Take 1 tablet (1 mg total) by mouth daily as needed for anxiety. 12/28/19   Norman Clay, MD  pramipexole (MIRAPEX) 0.5 MG tablet Take 1 tablet (0.5 mg total) by mouth 2 (two) times daily. 01/09/20   [name], MD    Allergies    Acetaminophen, Benadryl [diphenhydramine hcl], Codeine, Dilantin [phenytoin sodium extended], Melatonin, Tramadol, Ultram [tramadol hcl], Vimpat [lacosamide], Mirtazapine, Betadine [povidone iodine], Latex, Penicillins, Sulfa antibiotics, Tape, and Vicodin [hydrocodone-acetaminophen]  Review of Systems    Review of Systems  Constitutional: Positive for activity change and fatigue.  Eyes: Negative for visual disturbance.  Respiratory: Negative for shortness of breath.   Cardiovascular: Negative for chest pain.  Gastrointestinal: Negative for nausea and vomiting.  Neurological: Positive for dizziness. Negative for headaches.  Psychiatric/Behavioral: Positive for confusion and hallucinations.  All other systems reviewed and are negative.   Physical Exam Updated Vital Signs BP 111/67   Pulse 85   Temp 98 F (36.7 C) (Oral)   Resp (!) 21   Ht 5' (1.524 m)   Wt 58 kg   SpO2 100%   BMI 24.97 kg/m   Physical Exam Vitals and nursing note reviewed.  Constitutional:      Appearance: She is  well-developed.  HENT:     Head: Normocephalic and atraumatic.  Eyes:     Extraocular Movements: Extraocular movements intact and EOM normal.     Pupils: Pupils are equal, round, and reactive to light.  Cardiovascular:     Rate and Rhythm: Normal rate.  Pulmonary:     Effort: Pulmonary effort is normal.  Abdominal:     General: Bowel sounds are normal.  Musculoskeletal:     Cervical back: Normal range of motion and neck supple.  Skin:    General: Skin is warm and dry.  Neurological:     General: No focal deficit present.     Mental Status: She is alert and oriented to person, place, and time.     Cranial Nerves: No cranial nerve deficit.     Sensory: No sensory deficit.     Motor: No weakness.     Coordination: Coordination normal.     ED Results / Procedures / Treatments   Labs (all labs ordered are listed, but only abnormal results are displayed) Labs Reviewed  COMPREHENSIVE METABOLIC PANEL - Abnormal; Notable for the following components:      Result Value   Total Protein 6.2 (*)    Albumin 2.9 (*)    AST 155 (*)    ALT 96 (*)    All other components within normal limits  CBC WITH DIFFERENTIAL/PLATELET - Abnormal; Notable for the following components:   RBC 3.74 (*)     Hemoglobin 11.2 (*)    HCT 35.0 (*)    All other components within normal limits  URINALYSIS, ROUTINE W REFLEX MICROSCOPIC - Abnormal; Notable for the following components:   APPearance HAZY (*)    All other components within normal limits  SARS CORONAVIRUS 2 (TAT 6-24 HRS)  PROTIME-INR  LAMOTRIGINE LEVEL    EKG None  Radiology CT Head Wo Contrast  Result Date: 02/20/2020 CLINICAL DATA:  Dizziness, unsteady gait, seizure EXAM: CT HEAD WITHOUT CONTRAST TECHNIQUE: Contiguous axial images were obtained from the base of the skull through the vertex without intravenous contrast. COMPARISON:  02/12/2020 FINDINGS: Brain: No acute infarct or hemorrhage. Lateral ventricles and midline structures are unremarkable. No acute extra-axial fluid collections. No mass effect. Vascular: No hyperdense vessel or unexpected calcification. Skull: Normal. Negative for fracture or focal lesion. Sinuses/Orbits: Improved mucosal thickening within the maxillary, ethmoid, and sphenoid sinuses. Other: None. IMPRESSION: 1. No acute intracranial process. 2. Significant improvement in paranasal sinus disease. Electronically Signed   By: Randa Ngo M.D.   On: 02/20/2020 20:27    Procedures Procedures   Medications Ordered in ED Medications  sodium chloride 0.9 % bolus 1,000 mL (1,000 mLs Intravenous New Bag/Given 02/20/20 2103)    ED Course  I have reviewed the triage vital signs and the nursing notes.  Pertinent labs & imaging results that were available during my care of the patient were reviewed by me and considered in my medical decision making (see chart for details).  Clinical Course as of 02/20/20 2156  Mon Feb 20, 2020  [id number] Patient was symptomatic with orthostatic evaluation despite her blood pressure not dropping.  In addition, the tech reports that patient could not walk, she was extremely unsteady and weak.  CT head ordered because there have been numerous falls and to see if the meningioma is  changed.  Labs still pending.  Anticipate that she will need PT evaluation as well. [AN]  2154 CT Head Wo Contrast CT is not showing any acute changes.  I discussed the case with Dr. [name], neuro oncology.  He informs me that if the CT scan is negative, he doubts that the visual hallucinations/AMS is secondary to the meningioma.  He would like to see the patient in the clinic in 1 to 2 weeks.  Given that patient has significant difficulty with ADLs, she has new visual hallucinations which will need neuro evaluation to rule out organic cause, I will request hospitalist for observation type stay. [AN]    Clinical Course User Index [AN] Varney Biles, MD   MDM Rules/Calculators/A&P                          55 year old female comes in a chief complaint of confusion, weakness.  She has history of meningioma and is status post radiation treatment.  It appears that recently she was admitted for pneumonia.  She also had altered mental status at that time.  She has history of nonepileptic seizures and fatty liver, without any history of liver cirrhosis and there is no substance abuse history.  It appears that after her discharge she has had weakness.  She is decompensated to the point where she is unable to walk.  There is also family report of confusion and hallucinations.  Patient is speaking with people that are not in the room.  That symptom is new.  MRI from few weeks ago reviewed.  It is unclear why patient is having the visual hallucinations.  Differential diagnosis would be medication side effects, questionable stroke, questionable edema from the meningioma.  She is not hypoxic, we considered hypoxemia is a possibility as well given her pneumonia.  The weakness could just to be decompensation from her admission.  For now the plan is to get basic labs.  She was orthostatic positive last time during admission and I suspect that that could be possible again.  Dizziness is only present when she  gets up, therefore sounds orthostasis in nature.  Neuro exam is nonfocal.  Basic labs, CT head ordered.  Might have to touch base with neuro oncology.  Final Clinical Impression(s) / ED Diagnoses Final diagnoses:  Altered mental status, unspecified altered mental status type  Visual hallucination  Ataxia  Weakness    Rx / DC Orders ED Discharge Orders    None       Varney Biles, MD 02/20/20 [place] Reusing    Varney Biles, MD 02/20/20 2156

## 2020-02-21 DIAGNOSIS — Z20822 Contact with and (suspected) exposure to covid-19: Secondary | ICD-10-CM | POA: Diagnosis not present

## 2020-02-21 DIAGNOSIS — R531 Weakness: Secondary | ICD-10-CM | POA: Diagnosis not present

## 2020-02-21 DIAGNOSIS — R7989 Other specified abnormal findings of blood chemistry: Secondary | ICD-10-CM

## 2020-02-21 DIAGNOSIS — Y92009 Unspecified place in unspecified non-institutional (private) residence as the place of occurrence of the external cause: Secondary | ICD-10-CM

## 2020-02-21 DIAGNOSIS — W1830XA Fall on same level, unspecified, initial encounter: Secondary | ICD-10-CM | POA: Diagnosis not present

## 2020-02-21 DIAGNOSIS — K7290 Hepatic failure, unspecified without coma: Secondary | ICD-10-CM

## 2020-02-21 DIAGNOSIS — R55 Syncope and collapse: Secondary | ICD-10-CM | POA: Diagnosis not present

## 2020-02-21 DIAGNOSIS — Z86011 Personal history of benign neoplasm of the brain: Secondary | ICD-10-CM | POA: Diagnosis not present

## 2020-02-21 DIAGNOSIS — Z885 Allergy status to narcotic agent status: Secondary | ICD-10-CM | POA: Diagnosis not present

## 2020-02-21 DIAGNOSIS — Z880 Allergy status to penicillin: Secondary | ICD-10-CM | POA: Diagnosis not present

## 2020-02-21 DIAGNOSIS — Z8674 Personal history of sudden cardiac arrest: Secondary | ICD-10-CM | POA: Diagnosis not present

## 2020-02-21 DIAGNOSIS — K7682 Hepatic encephalopathy: Secondary | ICD-10-CM | POA: Diagnosis present

## 2020-02-21 DIAGNOSIS — Y92007 Garden or yard of unspecified non-institutional (private) residence as the place of occurrence of the external cause: Secondary | ICD-10-CM | POA: Diagnosis not present

## 2020-02-21 DIAGNOSIS — Z96651 Presence of right artificial knee joint: Secondary | ICD-10-CM | POA: Diagnosis present

## 2020-02-21 DIAGNOSIS — F32A Depression, unspecified: Secondary | ICD-10-CM | POA: Diagnosis present

## 2020-02-21 DIAGNOSIS — R296 Repeated falls: Secondary | ICD-10-CM | POA: Diagnosis present

## 2020-02-21 DIAGNOSIS — Z888 Allergy status to other drugs, medicaments and biological substances status: Secondary | ICD-10-CM | POA: Diagnosis not present

## 2020-02-21 DIAGNOSIS — K760 Fatty (change of) liver, not elsewhere classified: Secondary | ICD-10-CM | POA: Diagnosis present

## 2020-02-21 DIAGNOSIS — Z79899 Other long term (current) drug therapy: Secondary | ICD-10-CM | POA: Diagnosis not present

## 2020-02-21 DIAGNOSIS — R441 Visual hallucinations: Secondary | ICD-10-CM | POA: Diagnosis not present

## 2020-02-21 DIAGNOSIS — E7220 Disorder of urea cycle metabolism, unspecified: Secondary | ICD-10-CM | POA: Diagnosis not present

## 2020-02-21 DIAGNOSIS — E7800 Pure hypercholesterolemia, unspecified: Secondary | ICD-10-CM | POA: Diagnosis present

## 2020-02-21 DIAGNOSIS — G9341 Metabolic encephalopathy: Secondary | ICD-10-CM | POA: Diagnosis not present

## 2020-02-21 DIAGNOSIS — G40209 Localization-related (focal) (partial) symptomatic epilepsy and epileptic syndromes with complex partial seizures, not intractable, without status epilepticus: Secondary | ICD-10-CM | POA: Diagnosis not present

## 2020-02-21 DIAGNOSIS — E860 Dehydration: Secondary | ICD-10-CM | POA: Diagnosis not present

## 2020-02-21 DIAGNOSIS — R7401 Elevation of levels of liver transaminase levels: Secondary | ICD-10-CM | POA: Diagnosis not present

## 2020-02-21 DIAGNOSIS — Z91048 Other nonmedicinal substance allergy status: Secondary | ICD-10-CM | POA: Diagnosis not present

## 2020-02-21 DIAGNOSIS — Z91040 Latex allergy status: Secondary | ICD-10-CM | POA: Diagnosis not present

## 2020-02-21 DIAGNOSIS — Z886 Allergy status to analgesic agent status: Secondary | ICD-10-CM | POA: Diagnosis not present

## 2020-02-21 DIAGNOSIS — Z96642 Presence of left artificial hip joint: Secondary | ICD-10-CM | POA: Diagnosis present

## 2020-02-21 DIAGNOSIS — R413 Other amnesia: Secondary | ICD-10-CM | POA: Diagnosis not present

## 2020-02-21 DIAGNOSIS — G2581 Restless legs syndrome: Secondary | ICD-10-CM | POA: Diagnosis present

## 2020-02-21 DIAGNOSIS — W19XXXA Unspecified fall, initial encounter: Secondary | ICD-10-CM

## 2020-02-21 DIAGNOSIS — J449 Chronic obstructive pulmonary disease, unspecified: Secondary | ICD-10-CM | POA: Diagnosis not present

## 2020-02-21 LAB — COMPREHENSIVE METABOLIC PANEL
ALT: 83 U/L — ABNORMAL HIGH (ref 0–44)
AST: 134 U/L — ABNORMAL HIGH (ref 15–41)
Albumin: 2.7 g/dL — ABNORMAL LOW (ref 3.5–5.0)
Alkaline Phosphatase: 106 U/L (ref 38–126)
Anion gap: 7 (ref 5–15)
BUN: 9 mg/dL (ref 6–20)
CO2: 25 mmol/L (ref 22–32)
Calcium: 8.7 mg/dL — ABNORMAL LOW (ref 8.9–10.3)
Chloride: 108 mmol/L (ref 98–111)
Creatinine, Ser: 0.53 mg/dL (ref 0.44–1.00)
GFR, Estimated: >60 mL/min (ref >60)
Glucose, Bld: 77 mg/dL (ref 70–99)
Potassium: 4.1 mmol/L (ref 3.5–5.1)
Sodium: 140 mmol/L (ref 135–145)
Total Bilirubin: 0.9 mg/dL (ref 0.3–1.2)
Total Protein: 5.8 g/dL — ABNORMAL LOW (ref 6.5–8.1)

## 2020-02-21 LAB — CBC
HCT: 32 % — ABNORMAL LOW (ref 36.0–46.0)
Hemoglobin: 10.4 g/dL — ABNORMAL LOW (ref 12.0–15.0)
MCH: 30.3 pg (ref 26.0–34.0)
MCHC: 32.5 g/dL (ref 30.0–36.0)
MCV: 93.3 fL (ref 80.0–100.0)
Platelets: 187 10*3/uL (ref 150–400)
RBC: 3.43 MIL/uL — ABNORMAL LOW (ref 3.87–5.11)
RDW: 15.4 % (ref 11.5–15.5)
WBC: 7.8 10*3/uL (ref 4.0–10.5)
nRBC: 0 % (ref 0.0–0.2)

## 2020-02-21 LAB — RAPID URINE DRUG SCREEN, HOSP PERFORMED
Amphetamines: NOT DETECTED
Barbiturates: NOT DETECTED
Benzodiazepines: NOT DETECTED
Cocaine: NOT DETECTED
Opiates: NOT DETECTED
Tetrahydrocannabinol: NOT DETECTED

## 2020-02-21 LAB — TSH: TSH: 2.648 u[IU]/mL (ref 0.350–4.500)

## 2020-02-21 LAB — AMMONIA: Ammonia: 78 umol/L — ABNORMAL HIGH (ref 9–35)

## 2020-02-21 LAB — HEPATITIS PANEL, ACUTE
HCV Ab: NONREACTIVE
Hep A IgM: NONREACTIVE
Hep B C IgM: NONREACTIVE
Hepatitis B Surface Ag: NONREACTIVE

## 2020-02-21 LAB — MAGNESIUM: Magnesium: 2 mg/dL (ref 1.7–2.4)

## 2020-02-21 LAB — SARS CORONAVIRUS 2 (TAT 6-24 HRS): SARS Coronavirus 2: NEGATIVE

## 2020-02-21 MED ORDER — GUAIFENESIN-DM 100-10 MG/5ML PO SYRP
5.0000 mL | ORAL_SOLUTION | ORAL | Status: DC | PRN
  Administered 2020-02-21: 5 mL via ORAL
  Filled 2020-02-21: qty 5

## 2020-02-21 MED ORDER — GABAPENTIN 300 MG PO CAPS
600.0000 mg | ORAL_CAPSULE | Freq: Three times a day (TID) | ORAL | Status: DC
  Administered 2020-02-21 – 2020-02-24 (×10): 600 mg via ORAL
  Filled 2020-02-21 (×10): qty 2

## 2020-02-21 MED ORDER — LACTULOSE 10 GM/15ML PO SOLN
20.0000 g | Freq: Two times a day (BID) | ORAL | Status: DC
  Administered 2020-02-21 – 2020-02-22 (×3): 20 g via ORAL
  Filled 2020-02-21 (×3): qty 30

## 2020-02-21 NOTE — H&P (Addendum)
TRH H&P    Patient Demographics:    [name], is a 55 y.o. female  MRN: [id number]  DOB - May 10, 1965  Admit Date - 02/20/2020  Referring MD/NP/PA: Kathrynn Humble  Outpatient Primary MD for the patient is [name], MD  Patient coming from: [facility] [place]  Chief complaint-weakness confusion and fall   HPI:    [name]  is a 55 y.o. female, with history of vertigo, psychogenic seizures, hyperlipidemia, fatty liver, depression, COPD, and more presents the ED with a chief complaint of confusion weakness and fall.  Home health nurse apparently went to check on patient today and sent her to the ER because she had a witnessed fall where she hit her head, she is not able to walk.  Patient is a poor historian, but reports that she was walking across the yard when something caught her foot and she fell and hit her head.  She has no acute changes in vision or hearing.  She reports no loss of consciousness.  She lives alone home health nurse reports that she is unable to ambulate, so she is an unsafe discharge tonight.  Patient is really not able to get a history.  She reports that she is dizzy but it has been going on forever.  She has been drinking more water than normal about 40 ounces per day.  She reports that she thinks her dizziness is when she stands up and tries to walk.  She does have a documented history of vertigo.  She reports that that is been going on since before her last admission.  She had no appetite for months.  When asked about hallucinations she thinks that there may have been some auditory hallucinations, where she hears people asking her questions and when she answers them they are not really there.  She reports no changes in her medication.  The only pain she is in right now is the soreness from when she fell.  Patient has not been released from her last admission.  She does not drink, does not smoke, does not  use illicit drugs.  She has had 3 vaccines for Covid.  She is full code.  In the ED Temp 98, heart rate 85, respiratory rate 14-22, blood pressure 111/67, satting at 100% Leukocytosis White blood cell count of 8.0, hemoglobin 11.2, chemistry panel is unremarkable, albumin is low at 2.9, AST is 155, ALT 96 Covid negative CT head shows no acute intracranial process paranasal sinus disease present UA is not indicative of UTI ED provider was able to speak with patient's niece reported the patient has been having visual hallucinations which is a new thing for her.  She has no history of substance abuse.  At baseline patient takes care of her self and lives independently.  Recently she has had increased weakness and falls.    Review of systems:    In addition to the HPI above,  No Fever-chills, No Headache, No changes with Vision or hearing, No problems swallowing food or Liquids, No Chest pain, Cough or Shortness of  Breath, No Abdominal pain, No Nausea or Vomiting, bowel movements are regular, admits to decreased appetite No Blood in stool or Urine, No dysuria, No new skin rashes or bruises, No new joints pains-aches,  No new asymmetric weakness, tingling, numbness in any extremity, No recent weight gain or loss, No polyuria, polydypsia or polyphagia,   All other systems reviewed and are negative.    Past History of the following :    Past Medical History:  Diagnosis Date  . Arthritis    "knees" (04/22/2012)  . Chronic bronchitis ([place])    "yearly; when the weather changes" (04/22/2012)  . Colon polyp   . Colon polyps    adenomatous and hyperplastic-  . Depression   . Eczema   . Epilepsy ([place])    "been having them right often here lately" (04/22/2012)  . Fatty liver   . High cholesterol   . History of kidney stones   . Osteoarthritis    [name] 04/22/2012  . Other convulsions 05/21/12   non-epileptic spells  . Restless leg   . Seizures ([place])   . Vertigo       Past  Surgical History:  Procedure Laterality Date  . ABDOMINAL HYSTERECTOMY  2001  . BLADDER SUSPENSION    . BUNIONECTOMY Left 2000  . CESAREAN SECTION  1987; 1988  . COLONOSCOPY    . JOINT REPLACEMENT    . MASS EXCISION  10/22/2011   Procedure: EXCISION MASS;  Surgeon: [name], MD;  Location: [facility];  Service: General;  Laterality: Right;  excision right buttock mass  . TOTAL HIP ARTHROPLASTY Left 1993; 1995; 2000  . TOTAL KNEE ARTHROPLASTY Left 05/07/2015   Procedure: TOTAL LEFT KNEE ARTHROPLASTY;  Surgeon: [name], MD;  Location: [facility];  Service: Orthopedics;  Laterality: Left;  . TOTAL KNEE ARTHROPLASTY Right 10/01/2015   Procedure: RIGHT TOTAL KNEE ARTHROPLASTY;  Surgeon: [name], MD;  Location: [facility];  Service: Orthopedics;  Laterality: Right;      Social History:      Social History   Tobacco Use  . Smoking status: Never Smoker  . Smokeless tobacco: Never Used  Substance Use Topics  . Alcohol use: No    Alcohol/week: 0.0 standard drinks    Comment: 12-25-2015 per pt no       Family History :     Family History  Problem Relation Age of Onset  . Cancer Mother   . Depression Father   . Cancer Brother   . Diabetes Brother   . Cancer Maternal Aunt   . Diabetes Maternal Aunt   . Bipolar disorder Son   . Drug abuse Son   . Heart attack Sister 22       during severe illness     Home Medications:   Prior to Admission medications   Medication Sig Start Date End Date Taking? Authorizing Provider  benzonatate (TESSALON) 100 MG capsule Take 100 mg by mouth 3 (three) times daily as needed. 02/08/20  Yes [provider]  FLUoxetine (PROZAC) 40 MG capsule Take 2 capsules (80 mg total) by mouth daily. 01/28/20  Yes Hisada, Elie Goody, MD  gabapentin (NEURONTIN) 600 MG tablet TAKE 1 TABLET BY MOUTH THREE TIMES DAILY Patient taking differently: Take 600 mg by mouth at bedtime. 07/05/19  Yes [name], MD  lamoTRIgine (LAMICTAL) 150 MG tablet Take  1 tablet (150 mg total) by mouth 2 (two) times daily. For mood control Patient taking differently: Take 150 mg by mouth 2 (  two) times daily. 12/26/19  Yes Vaslow, Acey Lav, MD  levETIRAcetam (KEPPRA) 1000 MG tablet Take 1 in the morning and 1 tablet at bedtime. Patient taking differently: Take 1,000 mg by mouth 2 (two) times daily. 12/26/19  Yes Vaslow, Acey Lav, MD  LORazepam (ATIVAN) 1 MG tablet Take 1 tablet (1 mg total) by mouth daily as needed for anxiety. 12/28/19  Yes Norman Clay, MD  pramipexole (MIRAPEX) 0.5 MG tablet Take 1 tablet (0.5 mg total) by mouth 2 (two) times daily. 01/09/20  Yes [name], MD     Allergies:     Allergies  Allergen Reactions  . Acetaminophen Other (See Comments)    Has fatty deposits on liver  . Benadryl [Diphenhydramine Hcl] Other (See Comments)    hyperactivity and seizures  . Codeine Itching and Rash    seizures  . Dilantin [Phenytoin Sodium Extended] Other (See Comments)    Elevated LFT's  . Melatonin Other (See Comments)    seizures  . Tramadol Other (See Comments)    "causes seizures"  . Ultram [Tramadol Hcl] Other (See Comments)    Seizures  . Vimpat [Lacosamide] Other (See Comments)    Severe dizziness  . Mirtazapine Other (See Comments)    Potentiated seizure activity  . Betadine [Povidone Iodine] Rash  . Latex Rash  . Penicillins Itching and Rash    Has patient had a PCN reaction causing immediate rash, facial/tongue/throat swelling, SOB or lightheadedness with hypotension: No Has patient had a PCN reaction causing severe rash involving mucus membranes or skin necrosis: No Has patient had a PCN reaction that required hospitalization No Has patient had a PCN reaction occurring within the last 10 years: No If all of the above answers are "NO", then may proceed with Cephalosporin use.  . Sulfa Antibiotics Rash  . Tape Rash and Other (See Comments)    Paper tape please Paper tape please  . Vicodin  [Hydrocodone-Acetaminophen] Itching     Physical Exam:   Vitals  Blood pressure 108/74, pulse 67, temperature 97.8 F (36.6 C), resp. rate 20, height 5\' 1"  (1.549 m), weight 60.3 kg, SpO2 100 %.  1.  General: Patient lying supine in bed in no acute distress  2. Psychiatric: Poor insight into the events leading to her presentation to the ER, behavior normal, cooperative with exam  3. Neurologic: At baseline, no acute asymmetric weakness, speech and language normal  4. HEENMT:  Head is atraumatic, pupils are reactive to light, neck is supple, trachea is midline, mucous membranes are moist  5. Respiratory : Lungs are clear to auscultation bilaterally without wheezes rhonchi or crackles  6. Cardiovascular : Heart rate is normal, rhythm is regular, no murmurs rubs or gallops  7. Gastrointestinal:  Abdomen is soft, nondistended, nontender to palpation  8. Skin:  Skin is warm dry and intact without acute lesion on limited exam  9.Musculoskeletal:  No calf tenderness, no acute deformities, peripheral pulses palpated    Data Review:    CBC Recent Labs  Lab 02/14/20 0410 02/20/20 1939  WBC 5.9 8.0  HGB 10.5* 11.2*  HCT 32.3* 35.0*  PLT 216 175  MCV 92.3 93.6  MCH 30.0 29.9  MCHC 32.5 32.0  RDW 14.2 15.0  LYMPHSABS  --  1.7  MONOABS  --  0.6  EOSABS  --  0.1  BASOSABS  --  0.0   ------------------------------------------------------------------------------------------------------------------  Results for orders placed or performed during the hospital encounter of 02/20/20 (from the past 48 hour(s))  Urinalysis, Routine w reflex microscopic Urine, Clean Catch     Status: Abnormal   Collection Time: 02/20/20  6:26 PM  Result Value Ref Range   Color, Urine YELLOW YELLOW   APPearance HAZY (A) CLEAR   Specific Gravity, Urine 1.019 1.005 - 1.030   pH 5.0 5.0 - 8.0   Glucose, UA NEGATIVE NEGATIVE mg/dL   Hgb urine dipstick NEGATIVE NEGATIVE   Bilirubin Urine  NEGATIVE NEGATIVE   Ketones, ur NEGATIVE NEGATIVE mg/dL   Protein, ur NEGATIVE NEGATIVE mg/dL   Nitrite NEGATIVE NEGATIVE   Leukocytes,Ua NEGATIVE NEGATIVE    Comment: Performed at [address] [place], [place] [address]  Comprehensive metabolic panel     Status: Abnormal   Collection Time: 02/20/20  7:39 PM  Result Value Ref Range   Sodium 139 135 - 145 mmol/L   Potassium 3.9 3.5 - 5.1 mmol/L   Chloride 107 98 - 111 mmol/L   CO2 27 22 - 32 mmol/L   Glucose, Bld 83 70 - 99 mg/dL    Comment: Glucose reference range applies only to samples taken after fasting for at least 8 hours.   BUN 9 6 - 20 mg/dL   Creatinine, Ser 0.60 0.44 - 1.00 mg/dL   Calcium 8.9 8.9 - 10.3 mg/dL   Total Protein 6.2 (L) 6.5 - 8.1 g/dL   Albumin 2.9 (L) 3.5 - 5.0 g/dL   AST 155 (H) 15 - 41 U/L   ALT 96 (H) 0 - 44 U/L   Alkaline Phosphatase 112 38 - 126 U/L   Total Bilirubin 0.9 0.3 - 1.2 mg/dL   GFR, Estimated >60 >60 mL/min    Comment: (NOTE) Calculated using the CKD-EPI Creatinine Equation (2021)    Anion gap 5 5 - 15    Comment: Performed at [facility], [address], [place], [place] [address]  CBC with Differential     Status: Abnormal   Collection Time: 02/20/20  7:39 PM  Result Value Ref Range   WBC 8.0 4.0 - 10.5 K/uL   RBC 3.74 (L) 3.87 - 5.11 MIL/uL   Hemoglobin 11.2 (L) 12.0 - 15.0 g/dL   HCT 35.0 (L) 36.0 - 46.0 %   MCV 93.6 80.0 - 100.0 fL   MCH 29.9 26.0 - 34.0 pg   MCHC 32.0 30.0 - 36.0 g/dL   RDW 15.0 11.5 - 15.5 %   Platelets 175 150 - 400 K/uL   nRBC 0.0 0.0 - 0.2 %   Neutrophils Relative % 71 %   Neutro Abs 5.6 1.7 - 7.7 K/uL   Lymphocytes Relative 21 %   Lymphs Abs 1.7 0.7 - 4.0 K/uL   Monocytes Relative 7 %   Monocytes Absolute 0.6 0.1 - 1.0 K/uL   Eosinophils Relative 1 %   Eosinophils Absolute 0.1 0.0 - 0.5 K/uL   Basophils Relative 0 %   Basophils Absolute 0.0 0.0 - 0.1 K/uL   Immature Granulocytes 0 %   Abs Immature Granulocytes 0.03 0.00 - 0.07  K/uL    Comment: Performed at [facility], [address], [place], [place] [address]  Protime-INR     Status: None   Collection Time: 02/20/20  7:39 PM  Result Value Ref Range   Prothrombin Time 13.5 11.4 - 15.2 seconds   INR 1.1 0.8 - 1.2    Comment: (NOTE) INR goal varies based on device and disease states. Performed at [facility], [address], [place], [place] [address]  Chemistries  Recent Labs  Lab 02/14/20 0410 02/20/20 1939  NA 140 139  K 3.5 3.9  CL 109 107  CO2 25 27  GLUCOSE 86 83  BUN 6 9  CREATININE 0.43* 0.60  CALCIUM 8.0* 8.9  AST 77* 155*  ALT 58* 96*  ALKPHOS 87 112  BILITOT 0.9 0.9   ------------------------------------------------------------------------------------------------------------------  ------------------------------------------------------------------------------------------------------------------ GFR: Estimated Creatinine Clearance: 67 mL/min (by C-G formula based on SCr of 0.6 mg/dL). Liver Function Tests: Recent Labs  Lab 02/14/20 0410 02/20/20 1939  AST 77* 155*  ALT 58* 96*  ALKPHOS 87 112  BILITOT 0.9 0.9  PROT 5.7* 6.2*  ALBUMIN 2.6* 2.9*   No results for input(s): LIPASE, AMYLASE in the last 168 hours. No results for input(s): AMMONIA in the last 168 hours. Coagulation Profile: Recent Labs  Lab 02/20/20 1939  INR 1.1   Cardiac Enzymes: No results for input(s): CKTOTAL, CKMB, CKMBINDEX, TROPONINI in the last 168 hours. BNP (last 3 results) No results for input(s): PROBNP in the last 8760 hours. HbA1C: No results for input(s): HGBA1C in the last 72 hours. CBG: No results for input(s): GLUCAP in the last 168 hours. Lipid Profile: No results for input(s): CHOL, HDL, LDLCALC, TRIG, CHOLHDL, LDLDIRECT in the last 72 hours. Thyroid Function Tests: No results for input(s): TSH, T4TOTAL, FREET4, T3FREE, THYROIDAB in the last 72 hours. Anemia Panel: No results for input(s): VITAMINB12, FOLATE, FERRITIN, TIBC,  IRON, RETICCTPCT in the last 72 hours.  --------------------------------------------------------------------------------------------------------------- Urine analysis:    Component Value Date/Time   COLORURINE YELLOW 02/20/2020 1826   APPEARANCEUR HAZY (A) 02/20/2020 1826   APPEARANCEUR Clear 01/30/2017 1020   LABSPEC 1.019 02/20/2020 1826   PHURINE 5.0 02/20/2020 1826   GLUCOSEU NEGATIVE 02/20/2020 1826   HGBUR NEGATIVE 02/20/2020 1826   BILIRUBINUR NEGATIVE 02/20/2020 1826   BILIRUBINUR Negative 01/30/2017 1020   KETONESUR NEGATIVE 02/20/2020 1826   PROTEINUR NEGATIVE 02/20/2020 1826   UROBILINOGEN negative 04/19/2014 1332   UROBILINOGEN 0.2 12/04/2013 1414   NITRITE NEGATIVE 02/20/2020 1826   LEUKOCYTESUR NEGATIVE 02/20/2020 1826      Imaging Results:    CT Head Wo Contrast  Result Date: 02/20/2020 CLINICAL DATA:  Dizziness, unsteady gait, seizure EXAM: CT HEAD WITHOUT CONTRAST TECHNIQUE: Contiguous axial images were obtained from the base of the skull through the vertex without intravenous contrast. COMPARISON:  02/12/2020 FINDINGS: Brain: No acute infarct or hemorrhage. Lateral ventricles and midline structures are unremarkable. No acute extra-axial fluid collections. No mass effect. Vascular: No hyperdense vessel or unexpected calcification. Skull: Normal. Negative for fracture or focal lesion. Sinuses/Orbits: Improved mucosal thickening within the maxillary, ethmoid, and sphenoid sinuses. Other: None. IMPRESSION: 1. No acute intracranial process. 2. Significant improvement in paranasal sinus disease. Electronically Signed   By: Randa Ngo M.D.   On: 02/20/2020 20:27      Assessment & Plan:    Principal Problem:   Acute metabolic encephalopathy Active Problems:   Elevated liver function tests   Generalized weakness   Fall at home, initial encounter   1. Acute metabolic encephalopathy 1. Unclear etiology 2. CT head = no acute intracranial process 3. Neuro onc does  not think that this is related to meningioma 4. Described as hallucinations and confusion- sent to ER by home nurse 5. Patient is not aware of confusion/hallucinations. 6. TSH, UDS pending 7. Neuro consult  2. Mechanical Fall 1. Fell and hit head 2. Generalized weakness since 3. Generalized weakness 1. Deconditioning from last hospital stay? 2. PT eval 3.  Electrolytes are WNL 4. No signs of infection 5. Continue to monitor 4. Transaminitis 1. Elevated compared to the last hospital stay 2. AST 77>> 155, ALT 58>>96 3. Acute hepatitis panel pending 4. Trend in the AM 5. Reports no medication changes 6. Continue to monitor 5. Psych disorder 1. Continue psych medications 6. Protein cal mal 1. Advised about nutrient dense foods 2. Intolerance to some ingredients in protein shakes   DVT Prophylaxis-   Heparin - SCDs   AM Labs Ordered, also please review Full Orders  Family Communication: No family at bedside Code Status:  Full  Admission status: Observation  Time spent in minutes : [place] DO

## 2020-02-21 NOTE — Evaluation (Signed)
Physical Therapy Evaluation Patient Details Name: [name] MRN: [id number] DOB: 11/14/1965 Today's Date: 02/21/2020   History of Present Illness  [name]  is a 55 y.o. female, with history of vertigo, psychogenic seizures, hyperlipidemia, fatty liver, depression, COPD presents with complaint of confusion, weakness and fall.  Home health nurse went to check on patient today and sent her to the ER because she had a witnessed fall where she hit her head, she is not able to walk.  Clinical Impression  Pt admitted with above diagnosis. Pt slightly unsteady on her feet, maintains weight posterior with BLE braced on bed or LOB backwards requiring min A to recover. Pt with BLE strong and equal per MMT and denies numbness/tingling throughout. Pt appears lethargic during eval, reports "I'm so sleepy" so assisted back to supine with all needs in reach at EOS. Pt reports wanting to return home with family to assist, reports her mom will be able to stay with her 24/7; recommending HHPT. Pt currently with functional limitations due to the deficits listed below (see PT Problem List). Pt will benefit from skilled PT to increase their independence and safety with mobility to allow discharge to the venue listed below.       Follow Up Recommendations Home health PT;Supervision - Intermittent;Supervision for mobility/OOB    Equipment Recommendations  None recommended by PT    Recommendations for Other Services       Precautions / Restrictions Precautions Precautions: Fall Restrictions Weight Bearing Restrictions: No      Mobility  Bed Mobility Overal bed mobility: Needs Assistance Bed Mobility: Supine to Sit  Supine to sit: Supervision  General bed mobility comments: slow to upright trunk and scoot out to EOB    Transfers Overall transfer level: Needs assistance Equipment used: Rolling walker (2 wheeled) Transfers: Sit to/from Stand Sit to Stand: Min guard  General transfer comment:  cues to push from seated surface to rise, BLE braced on front of bedm weight slightly posterior that improves with cues  Ambulation/Gait Ambulation/Gait assistance: Min assist Gait Distance (Feet): 6 Feet Assistive device: Rolling walker (2 wheeled) Gait Pattern/deviations: Step-to pattern;Decreased stride length Gait velocity: decreased   General Gait Details: limited to unsteady steps at bedside, LOB with standing marching using RW but able to recover with min A, weight posterior that improves minimally with verbal cues, limited by fatigue/sleepiness  Stairs            Wheelchair Mobility    Modified Rankin (Stroke Patients Only)       Balance Overall balance assessment: Needs assistance;History of Falls Sitting-balance support: Feet supported;No upper extremity supported Sitting balance-Leahy Scale: Good Sitting balance - Comments: seated at EOB  Standing balance support: During functional activity;Bilateral upper extremity supported Standing balance-Leahy Scale: PoorStanding balance comment: reliant on UE support        Pertinent Vitals/Pain Pain Assessment: No/denies pain    Home Living Family/patient expects to be discharged to:: Private residence Living Arrangements: Alone (3 neighbors are family) Available Help at Discharge: Family;Available PRN/intermittently Type of Home: Mobile home Home Access: Stairs to enter Entrance Stairs-Rails: Right;Left;Can reach both Entrance Stairs-Number of Steps: 4 Home Layout: One level Home Equipment: Shower seat;Bedside commode;Walker - 4 wheels Additional Comments: Pt reports her mom has been staying with her at night since last d/c to assist if needed    Prior Function Level of Independence: Needs assistance   Gait / Transfers Assistance Needed: pt reports using rollator walker in the home, pt endorses "a  few" falls recently  ADL's / Homemaking Assistance Needed: pt reports independent with bathing and dressing; mom  completes cooking,cleaning and grocery shopping        Hand Dominance        Extremity/Trunk Assessment   Upper Extremity Assessment Upper Extremity Assessment: Overall WFL for tasks assessed    Lower Extremity Assessment Lower Extremity Assessment: Overall WFL for tasks assessed (AROM WNL, strength 4/5, denies numbness/tingling throughout BLE)    Cervical / Trunk Assessment Cervical / Trunk Assessment: Normal  Communication   Communication: No difficulties  Cognition Arousal/Alertness: Lethargic Behavior During Therapy: WFL for tasks assessed/performed Overall Cognitive Status: Within Functional Limits for tasks assessed  General Comments: pt c/o "I'm so sleepy" throughout eval, able to open eyes and follow commands but admits she didn't sleep well last night and is very tired.      General Comments      Exercises     Assessment/Plan    PT Assessment Patient needs continued PT services  PT Problem List Decreased activity tolerance;Decreased balance;Decreased mobility;Decreased coordination;Decreased cognition;Decreased knowledge of use of DME;Cardiopulmonary status limiting activity       PT Treatment Interventions DME instruction;Gait training;Functional mobility training;Therapeutic activities;Therapeutic exercise;Balance training;Cognitive remediation;Patient/family education    PT Goals (Current goals can be found in the Care Plan section)  Acute Rehab PT Goals Patient Stated Goal: return home with family to assist PT Goal Formulation: With patient Time For Goal Achievement: 03/06/20 Potential to Achieve Goals: Good    Frequency Min 3X/week   Barriers to discharge        Co-evaluation               AM-PAC PT "6 Clicks" Mobility  Outcome Measure Help needed turning from your back to your side while in a flat bed without using bedrails?: None Help needed moving from lying on your back to sitting on the side of a flat bed without using bedrails?:  None Help needed moving to and from a bed to a chair (including a wheelchair)?: A Little Help needed standing up from a chair using your arms (e.g., wheelchair or bedside chair)?: A Little Help needed to walk in hospital room?: A Little Help needed climbing 3-5 steps with a railing? : A Little 6 Click Score: 20    End of Session Equipment Utilized During Treatment: Gait belt Activity Tolerance: Patient limited by lethargy Patient left: in bed;with call bell/phone within reach;with bed alarm set Nurse Communication: Mobility status PT Visit Diagnosis: Unsteadiness on feet (R26.81);Other abnormalities of gait and mobility (R26.89)    Time: 8546-2703 PT Time Calculation (min) (ACUTE ONLY): 14 min   Charges:   PT Evaluation $PT Eval Low Complexity: 1 Low           Tori Traci Gafford PT, DPT 02/21/20, 1:17 PM

## 2020-02-21 NOTE — Plan of Care (Signed)

## 2020-02-21 NOTE — Progress Notes (Signed)
Patient admitted to the hospital earlier this morning by Dr Clearence Ped  Patient seen and examined.  Overall mental status appears to be improving.  She does not appear to be responding to external stimuli.  She does have some asterixis.  Assessment/plan:  Acute metabolic encephalopathy -Work-up has revealed elevated ammonia level -She also has evidence of asterixis -Started on lactulose -Probably has some degree of dehydration as well that was contributing  Transaminitis -History of fatty liver per patient -She has outpatient evaluation scheduled with [facility] gastroenterology next week  Generalized weakness -Seen by physical therapy with recommendations for home health therapy  Seizure disorder -Continue on Keppra along with  Restless leg syndrome -Continue on Mirapex  Anticipate discharge home in the next 24 hours  [employer]

## 2020-02-21 NOTE — Plan of Care (Signed)
  Problem: Acute Rehab PT Goals(only PT should resolve) Goal: Pt Will Go Supine/Side To Sit Outcome: Progressing Flowsheets (Taken 02/21/2020 1321) Pt will go Supine/Side to Sit: with modified independence Goal: Patient Will Transfer Sit To/From Stand Outcome: Progressing Flowsheets (Taken 02/21/2020 1321) Patient will transfer sit to/from stand: with supervision Goal: Pt Will Ambulate Outcome: Progressing Flowsheets (Taken 02/21/2020 1321) Pt will Ambulate:  > 125 feet  with supervision  with rolling walker   Tori Dayanira Giovannetti PT, DPT 02/21/20, 1:22 PM

## 2020-02-21 NOTE — TOC Initial Note (Signed)
Transition of Care [facility]) - Initial/Assessment Note   Patient Details  Name: [name] MRN: [id number] Date of Birth: 06/02/1965  Transition of Care [facility]) CM/SW Contact:    Sherie Don, LCSW Phone Number: 02/21/2020, 10:32 AM  Clinical Narrative: Patient is a 55 year old female who is under observation for acute metabolic encephalopathy. TOC consulted for CHF consult.  CSW unable to reach patient to complete assessment, so CSW spoke with patient's niece, [name]. Per niece, patient resides at home alone but has been staying with her mother who lives next door overnight as patient has been experiencing confusion that "comes and goes" at home. Current DME includes a walker and shower chair. [facility] is [facility] with [facility] for HHPT and RN services. Niece reported patient was independent with her ADLs until 2-3 weeks ago when the patient starting showing signs of confusion. Patient's mother assists patient with taking her medications. Niece and mother provide transportation to appointments as patient has a history of seizures.  Per CHF screening, niece stated the patient does "not really" follow a heart healthy diet as she is a "picky eater." Patient does not restrict salt or fluid intake and has not been asked to complete daily weights to monitor fluid retention. TOC to follow for discharge needs.  Expected Discharge Plan: [place] Barriers to Discharge: Continued Medical Work up  Patient Goals and CMS Choice Patient states their goals for this hospitalization and ongoing recovery are:: See improvements with her confusion CMS Medicare.gov Compare Post Acute Care list provided to:: Patient Represenative (must comment) [name] (niece))  Expected Discharge Plan and Services Expected Discharge Plan: [place] In-house Referral: Clinical Social Work Discharge Planning Services: NA Post Acute Care Choice: [place] arrangements for  the past 2 months: Mobile Home               DME Arranged: N/A DME Agency: NA HH Arranged: PT,RN [place]: [place] ([facility])  Prior Living Arrangements/Services Living arrangements for the past 2 months: Mobile Home Lives with:: Self Patient language and need for interpreter reviewed:: Yes Do you feel safe going back to the place where you live?: Yes      Need for Family Participation in Patient Care: Yes (Comment) (Patient has AMS) Care giver support system in place?: Yes (comment) Current home services: DME,Home PT,Home RN Gilford Rile, shower chair) Criminal Activity/Legal Involvement Pertinent to Current Situation/Hospitalization: No - Comment as needed  Permission Sought/Granted Permission sought to share information with : Other (comment) Permission granted to share information with : Yes, Verbal Permission Granted Permission granted to share info w AGENCY: [facility]  Emotional Assessment Appearance:: Appears stated age Orientation: : Oriented to Self,Oriented to Place,Oriented to  Time,Oriented to Situation Alcohol / Substance Use: Not Applicable Psych Involvement: No (comment)  Admission diagnosis:  Visual hallucination [R44.1] Ataxia [R27.0] Weakness [R53.1] Altered mental status, unspecified altered mental status type [Q73.41] Acute metabolic encephalopathy [P37.90] Patient Active Problem List   Diagnosis Date Noted  . Generalized weakness 02/21/2020  . Fall at home, initial encounter 02/21/2020  . Acute metabolic encephalopathy 24/09/7351  . Hyperammonemia ([place]) 02/13/2020  . Meningioma ([place]) 09/12/2019  . Benign neoplasm of cerebral meninges ([place]) 09/09/2019  . Anxiety 12/30/2018  . Cardiac arrest ([place]) 12/15/2018  . MDD (major depressive disorder), recurrent episode, mild ([place]) 01/08/2018  . Widowed - July 2019 08/07/2017  . Sleep apnea 06/09/2017  . Colon polyp 06/05/2017  . Elevated liver  function tests 06/05/2017  . Fatty liver   . Mild  neurocognitive disorder 02/04/2017  . Seizure ([place]) 11/14/2016  . CAP (community acquired pneumonia) 11/09/2016  . 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 Lobar pneumonia ([place]) 11/28/2015  . Partial symptomatic epilepsy with complex partial seizures, not intractable, with status epilepticus ([place]) 07/24/2015  . OA (osteoarthritis) of knee 05/07/2015  . 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

## 2020-02-21 NOTE — Care Management Obs Status (Signed)
[place] NOTIFICATION   Patient Details  Name: [name] MRN: [id number] Date of Birth: [birth year]   Medicare Observation Status Notification Given:  Yes (mailed to [address], [place], [place] [address] due to precautions)    Tommy Medal 02/21/2020, 3:13 PM

## 2020-02-22 ENCOUNTER — Inpatient Hospital Stay: Payer: Medicare HMO | Admitting: Family Medicine

## 2020-02-22 LAB — LAMOTRIGINE LEVEL: Lamotrigine Lvl: 12 ug/mL (ref 2.0–20.0)

## 2020-02-22 LAB — AMMONIA: Ammonia: 83 umol/L — ABNORMAL HIGH (ref 9–35)

## 2020-02-22 MED ORDER — SODIUM CHLORIDE 0.9 % IV SOLN: INTRAVENOUS | Status: DC

## 2020-02-22 NOTE — Progress Notes (Signed)
PROGRESS NOTE  LEVITA MONICAL ENI:[id number] DOB: 1965/06/02 DOA: 02/20/2020 PCP: [name], MD  Brief History:  55 year old female with a history of COPD, meningioma, cardiac arrest December 2020, hepatic steatosis,PNES, cognitive impairment, depression, complex partial seizure presenting with confusion, generalized weakness and a fall.  Home health nurse apparently went to check on patient today and sent her to the ER because she had a witnessed fall where she hit her head, she is not able to walk.  Patient is a poor historian, but reports that she was walking across the yard when something caught her foot and she fell and hit her head.  She has no acute changes in vision or hearing.  She reports no loss of consciousness.  She lives alone home health nurse reports that she is unable to ambulate, so she is an unsafe discharge.  Patient also complained of n/v x 3 episodes without 24-48 hours of admission.  Denied diarrhea, cp, sob, abd pain.  Notably, the patient was just admitted to the hospital from 02/13/2020 to 02/14/2020 when she was treated for pneumonia as well as orthostatic hypotension and falls.  The patient was fluid resuscitated with improvement.  She was discharged home with home health PT.  Assessment/Plan: Acute metabolic Encephalopathy -multifactorial including elevated ammonia, dehydration and dopamine agonist -d/c mirapex -continue lactulose -UA--no pyuria -check B12 -check folate -Check TSH--2.648  Gait instability/frequent falls/dizziness -PT evaluation-->HHPT -CT brain negative acute findings -No focal deficits on exam -Check echo -Check orthostatic vital signs  Hyperammonemia -Clinical significance unclear as the patient's mental status is improving with increasing ammonia -discontinue lactulose and monitor clinically -ammonia peaked 83  Complex partial seizure/PNES -Continue home dose of Keppra and Lamictal  Depression -Continue fluoxetine 80  mg daily -Continue as needed lorazepam  Meningioma -Patient follows Dr. [name] -s/p fractionated radiosurgery Sept 2020--Dr. [name] -01/18/20 MR brain--mass is stable in size as compared to the brain MRI of 09/13/2019 -no focal deficits on exam      Status is: Inpatient  Remains inpatient appropriate because:IV treatments appropriate due to intensity of illness or inability to take PO   Dispo: The patient is from: Home              Anticipated d/c is to: Home              Anticipated d/c date is: 2 days              Patient currently not medically stable for d/c   Difficult to place patient No        Family Communication:   Family at bedside  Consultants:  none  Code Status:  FULL   DVT Prophylaxis:  [place] Heparin    Procedures: As Listed in Progress Note Above  Antibiotics: None      Subjective: Patient denies fevers, chills, headache, chest pain, dyspnea, nausea, vomiting, diarrhea, abdominal pain, dysuria, hematuria, hematochezia, and melena.   Objective: Vitals:   02/21/20 2116 02/22/20 0540 02/22/20 1100 02/22/20 1400  BP: (!) 153/98 103/75 104/77 108/70  Pulse: 70 64 83 90  Resp: 19 18  18   Temp: 98.2 F (36.8 C) 98.2 F (36.8 C)  98.7 F (37.1 C)  TempSrc:  Oral    SpO2: 98% 97% 97%   Weight:      Height:        Intake/Output Summary (Last 24 hours) at 02/22/2020 1639 Last data filed at 02/22/2020 0900 Gross per  24 hour  Intake 480 ml  Output -  Net 480 ml   Weight change:  Exam:   General:  Pt is alert, follows commands appropriately, not in acute distress  HEENT: No icterus, No thrush, No neck mass, [employer]/AT  Cardiovascular: RRR, S1/S2, no rubs, no gallops  Respiratory: CTA bilaterally, no wheezing, no crackles, no rhonchi  Abdomen: Soft/+BS, non tender, non distended, no guarding  Extremities: No edema, No lymphangitis, No petechiae, No rashes, no synovitis   Data Reviewed: I have personally reviewed following labs and  imaging studies Basic Metabolic Panel: Recent Labs  Lab 02/20/20 1939 02/21/20 0529  NA 139 140  K 3.9 4.1  CL 107 108  CO2 27 25  GLUCOSE 83 77  BUN 9 9  CREATININE 0.60 0.53  CALCIUM 8.9 8.7*  MG  --  2.0   Liver Function Tests: Recent Labs  Lab 02/20/20 1939 02/21/20 0529  AST 155* 134*  ALT 96* 83*  ALKPHOS 112 106  BILITOT 0.9 0.9  PROT 6.2* 5.8*  ALBUMIN 2.9* 2.7*   No results for input(s): LIPASE, AMYLASE in the last 168 hours. Recent Labs  Lab 02/21/20 0529 02/22/20 0515  AMMONIA 78* 83*   Coagulation Profile: Recent Labs  Lab 02/20/20 1939  INR 1.1   CBC: Recent Labs  Lab 02/20/20 1939 02/21/20 0529  WBC 8.0 7.8  NEUTROABS 5.6  --   HGB 11.2* 10.4*  HCT 35.0* 32.0*  MCV 93.6 93.3  PLT 175 187   Cardiac Enzymes: No results for input(s): CKTOTAL, CKMB, CKMBINDEX, TROPONINI in the last 168 hours. BNP: Invalid input(s): POCBNP CBG: No results for input(s): GLUCAP in the last 168 hours. HbA1C: No results for input(s): HGBA1C in the last 72 hours. Urine analysis:    Component Value Date/Time   COLORURINE YELLOW 02/20/2020 1826   APPEARANCEUR HAZY (A) 02/20/2020 1826   APPEARANCEUR Clear 01/30/2017 1020   LABSPEC 1.019 02/20/2020 1826   PHURINE 5.0 02/20/2020 1826   GLUCOSEU NEGATIVE 02/20/2020 1826   HGBUR NEGATIVE 02/20/2020 1826   BILIRUBINUR NEGATIVE 02/20/2020 1826   BILIRUBINUR Negative 01/30/2017 1020   KETONESUR NEGATIVE 02/20/2020 1826   PROTEINUR NEGATIVE 02/20/2020 1826   UROBILINOGEN negative 04/19/2014 1332   UROBILINOGEN 0.2 12/04/2013 1414   NITRITE NEGATIVE 02/20/2020 1826   LEUKOCYTESUR NEGATIVE 02/20/2020 1826   Sepsis Labs: @LABRCNTIP (procalcitonin:4,lacticidven:4) ) Recent Results (from the past 240 hour(s))  SARS CORONAVIRUS 2 ([place] 6-24 HRS) Nasopharyngeal Nasopharyngeal Swab     Status: None   Collection Time: 02/20/20  7:57 PM   Specimen: Nasopharyngeal Swab  Result Value Ref Range Status   SARS  Coronavirus 2 NEGATIVE NEGATIVE Final    Comment: (NOTE) SARS-CoV-2 target nucleic acids are NOT DETECTED.  The SARS-CoV-2 RNA is generally detectable in upper and lower respiratory specimens during the acute phase of infection. Negative results do not preclude SARS-CoV-2 infection, do not rule out co-infections with other pathogens, and should not be used as the sole basis for treatment or other patient management decisions. Negative results must be combined with clinical observations, patient history, and epidemiological information. The expected result is Negative.  Fact Sheet for Patients: SugarRoll.be  Fact Sheet for Healthcare Providers: https://www.woods-mathews.com/  This test is not yet approved or cleared by the [place] FDA and  has been authorized for detection and/or diagnosis of SARS-CoV-2 by FDA under an Emergency Use Authorization (EUA). This EUA will remain  in effect (meaning this test can be used) for the duration of the  COVID-19 declaration under Se ction 564(b)(1) of the Act, 21 U.S.C. section 360bbb-3(b)(1), unless the authorization is terminated or revoked sooner.  Performed at [facility], [place] [address] [place], [place] [address]      Scheduled Meds: . FLUoxetine  80 mg Oral Daily  . gabapentin  600 mg Oral TID  . heparin  5,000 Units Subcutaneous Q8H  . lactulose  20 g Oral BID  . lamoTRIgine  150 mg Oral BID  . levETIRAcetam  1,000 mg Oral BID  . pramipexole  0.5 mg Oral BID   Continuous Infusions:  Procedures/Studies: CT Head Wo Contrast  Result Date: 02/20/2020 CLINICAL DATA:  Dizziness, unsteady gait, seizure EXAM: CT HEAD WITHOUT CONTRAST TECHNIQUE: Contiguous axial images were obtained from the base of the skull through the vertex without intravenous contrast. COMPARISON:  02/12/2020 FINDINGS: Brain: No acute infarct or hemorrhage. Lateral ventricles and midline structures are  unremarkable. No acute extra-axial fluid collections. No mass effect. Vascular: No hyperdense vessel or unexpected calcification. Skull: Normal. Negative for fracture or focal lesion. Sinuses/Orbits: Improved mucosal thickening within the maxillary, ethmoid, and sphenoid sinuses. Other: None. IMPRESSION: 1. No acute intracranial process. 2. Significant improvement in paranasal sinus disease. Electronically Signed   By: Randa Ngo M.D.   On: 02/20/2020 20:27   CT Head Wo Contrast  Result Date: 02/12/2020 CLINICAL DATA:  Head trauma, fell yesterday striking head, loss of consciousness, complaining of dizziness, history of seizures EXAM: CT HEAD WITHOUT CONTRAST TECHNIQUE: Contiguous axial images were obtained from the base of the skull through the vertex without intravenous contrast. COMPARISON:  06/22/2019 FINDINGS: Brain: Normal ventricular morphology. No midline shift or mass effect. Normal appearance of brain parenchyma. No intracranial hemorrhage, mass lesion, or evidence of acute infarction. No extra-axial fluid collections. Vascular: No hyperdense vessels. Minimal atherosclerotic calcification within internal carotid arteries at skull base Skull: Intact Sinuses/Orbits: Opacified RIGHT middle ear cavity. Mucosal thickening throughout the paranasal sinuses with small fluid levels in the maxillary and frontal sinuses. Other: N/A IMPRESSION: No acute intracranial abnormalities. Scattered sinus disease changes as above. Opacified RIGHT middle ear cavity new since previous exam, recommend correlation with physical exam. Electronically Signed   By: Lavonia Dana M.D.   On: 02/12/2020 16:15   CT ABDOMEN PELVIS W CONTRAST  Result Date: 02/12/2020 CLINICAL DATA:  Abdominal trauma after fall, flank ecchymosis. Patient reports chronic lower abdominal pain. EXAM: CT ABDOMEN AND PELVIS WITH CONTRAST TECHNIQUE: Multidetector CT imaging of the abdomen and pelvis was performed using the standard protocol following  bolus administration of intravenous contrast. CONTRAST:  22mL OMNIPAQUE IOHEXOL 300 MG/ML  SOLN COMPARISON:  Noncontrast CT 11/29/2017 FINDINGS: Lower chest: Right lower lobe consolidation with internal air bronchograms, series 4, image 7. Dependent opacity in the left lower lobe. Upper normal heart size. Hepatobiliary: Mild diffusely decreased hepatic density consistent with steatosis. No focal hepatic abnormality. No evidence of hepatic injury or perihepatic hematoma. Gallbladder physiologically distended, no calcified stone. No biliary dilatation. Pancreas: No acute finding. No ductal dilatation. Stable fullness in the pancreatic head. No discrete pancreatic mass. Spleen: Normal in size. No focal abnormality. No evidence of splenic injury or perisplenic hematoma. Adrenals/Urinary Tract: No adrenal nodule or hemorrhage. No renal injury. No hydronephrosis. Bilateral nonobstructing renal calculi. No focal renal lesion. Unremarkable urinary bladder. Stomach/Bowel: Decompressed stomach, motion through the upper abdomen limits detailed assessment. No bowel obstruction, inflammation, or evident wall thickening. No evidence of bowel injury. Moderately large stool burden throughout the colon. Low lying cecum in  the deep pelvis. Appendix not confidently visualized. No appendicitis. No mesenteric hematoma. Vascular/Lymphatic: Aortic and branch atherosclerosis. No acute vascular findings are vascular injury. No retroperitoneal fluid. No enlarged lymph nodes in the abdomen or pelvis. Probable calcified node abutting the ascending colon. Reproductive: Hysterectomy. 2.7 cm left ovarian/adnexal cyst. This is not significantly changed from prior exam. Other: No free air or free fluid. No confluent soft tissue hematoma. Musculoskeletal: Left hip arthroplasty. No acute fracture of the pelvis or lumbar spine. Multilevel lumbar spondylosis. IMPRESSION: 1. No acute traumatic injury to the abdomen or pelvis. 2. Right lower lobe  consolidation with internal air bronchograms, suspicious for pneumonia. Dependent opacity in the left lower lobe favors atelectasis. 3. Bilateral nonobstructing renal calculi. 4. Mild hepatic steatosis. 5. Stable 2.7 cm left ovarian/adnexal cyst. No follow-up imaging recommended. Note: This recommendation does not apply to premenarchal patients and to those with increased risk (genetic, family history, elevated tumor markers or other high-risk factors) of ovarian cancer. Reference: JACR 2020 Feb; 17(2):248-254 Aortic Atherosclerosis (ICD10-I70.0). Electronically Signed   By: Keith Rake M.D.   On: 02/12/2020 17:36   DG Chest Portable 1 View  Result Date: 02/12/2020 CLINICAL DATA:  Cough and shortness of breath EXAM: PORTABLE CHEST 1 VIEW COMPARISON:  03/03/2017 FINDINGS: Linear bandlike density above the right hemidiaphragm favors atelectasis given the configuration, less likely to be related to pneumonia. Old healed fracture of the right lateral seventh rib. Thoracic spondylosis. Heart size within normal limits for projection. The left lung appears clear. No blunting of the costophrenic angles. IMPRESSION: 1. Linear bandlike density above the right hemidiaphragm favors atelectasis over early pneumonia. Electronically Signed   By: Van Clines M.D.   On: 02/12/2020 15:59    Orson Eva, DO  [facility]  If 7PM-7AM, please contact night-coverage [email or web address] Password [email or web address] 02/22/2020, 4:39 PM   LOS: 1 day

## 2020-02-22 NOTE — Progress Notes (Signed)
Physical Therapy Treatment Patient Details Name: [name] MRN: [id number] DOB: 11/19/1965 Today's Date: 02/22/2020    History of Present Illness [name]  is a 55 y.o. female, with history of vertigo, psychogenic seizures, hyperlipidemia, fatty liver, depression, COPD presents with complaint of confusion, weakness and fall.  Home health nurse went to check on patient today and sent her to the ER because she had a witnessed fall where she hit her head, she is not able to walk.    PT Comments    Pt in bed eager to work with PT today.  Pt able to complete all transfers without assistance, hwoever increased time and some cues for UE placement and technique.  PT reported mild dizziness when initially came to sitting position, however passed quickly.  Pt increased ambulation distance to 75 feet total; became fatigued at half point and requested to turn back.  Pt also noted to become a little shaky in LE's at this point.  Pt went to void on [facility] before returning to bedside.              Precautions / Restrictions Precautions Precautions: Fall    Mobility  Bed Mobility Overal bed mobility: Modified Independent Bed Mobility: Supine to Sit     Supine to sit: Modified independent (Device/Increase time)     General bed mobility comments: slow to upright trunk and scoot out to EOB  Transfers Overall transfer level: Modified independent Equipment used: Rolling walker (2 wheeled) Transfers: Sit to/from Stand Sit to Stand: Modified independent (Device/Increase time)         General transfer comment: cues to push from seated surface to rise  Ambulation/Gait Ambulation/Gait assistance: Min guard Gait Distance (Feet): 75 Feet Assistive device: Rolling walker (2 wheeled) Gait Pattern/deviations: Step-to pattern;Decreased stride length Gait velocity: decreased   General Gait Details: small steps taken with cues to increase step length         Cognition   Behavior During  Therapy: [facility] for tasks assessed/performed Overall Cognitive Status: Within Functional Limits for tasks assessed                                               Pertinent Vitals/Pain Pain Assessment: No/denies pain           PT Goals (current goals can now be found in the care plan section) Progress towards PT goals: Progressing toward goals       PT Plan Current plan remains appropriate       AM-PAC PT "6 Clicks" Mobility   Outcome Measure  Help needed turning from your back to your side while in a flat bed without using bedrails?: None Help needed moving from lying on your back to sitting on the side of a flat bed without using bedrails?: None Help needed moving to and from a bed to a chair (including a wheelchair)?: A Little Help needed standing up from a chair using your arms (e.g., wheelchair or bedside chair)?: A Little Help needed to walk in hospital room?: A Little Help needed climbing 3-5 steps with a railing? : A Little 6 Click Score: 20    End of Session Equipment Utilized During Treatment: Gait belt Activity Tolerance: Patient limited by lethargy Patient left: in bed;with call bell/phone within reach;with bed alarm set Nurse Communication: Other (comment) (request for meds) PT Visit Diagnosis: Unsteadiness on  feet (R26.81);Other abnormalities of gait and mobility (R26.89)     Time: 3235-5732 PT Time Calculation (min) (ACUTE ONLY): 25 min  Charges:  $Gait Training: 8-22 mins $Therapeutic Activity: 8-22 mins                     Teena Irani, PTA/CLT [place], Kayani [place] B 02/22/2020, 4:32 PM

## 2020-02-23 ENCOUNTER — Other Ambulatory Visit: Payer: Self-pay

## 2020-02-23 ENCOUNTER — Inpatient Hospital Stay: Payer: Medicare HMO | Admitting: Internal Medicine

## 2020-02-23 ENCOUNTER — Inpatient Hospital Stay (HOSPITAL_COMMUNITY): Payer: Medicare HMO

## 2020-02-23 DIAGNOSIS — R55 Syncope and collapse: Secondary | ICD-10-CM

## 2020-02-23 DIAGNOSIS — R441 Visual hallucinations: Secondary | ICD-10-CM

## 2020-02-23 LAB — COMPREHENSIVE METABOLIC PANEL
ALT: 87 U/L — ABNORMAL HIGH (ref 0–44)
AST: 146 U/L — ABNORMAL HIGH (ref 15–41)
Albumin: 2.7 g/dL — ABNORMAL LOW (ref 3.5–5.0)
Alkaline Phosphatase: 112 U/L (ref 38–126)
Anion gap: 5 (ref 5–15)
BUN: 7 mg/dL (ref 6–20)
CO2: 23 mmol/L (ref 22–32)
Calcium: 8.4 mg/dL — ABNORMAL LOW (ref 8.9–10.3)
Chloride: 108 mmol/L (ref 98–111)
Creatinine, Ser: 0.43 mg/dL — ABNORMAL LOW (ref 0.44–1.00)
GFR, Estimated: >60 mL/min (ref >60)
Glucose, Bld: 79 mg/dL (ref 70–99)
Potassium: 4 mmol/L (ref 3.5–5.1)
Sodium: 136 mmol/L (ref 135–145)
Total Bilirubin: 1 mg/dL (ref 0.3–1.2)
Total Protein: 5.7 g/dL — ABNORMAL LOW (ref 6.5–8.1)

## 2020-02-23 LAB — AMMONIA: Ammonia: 88 umol/L — ABNORMAL HIGH (ref 9–35)

## 2020-02-23 LAB — FOLATE: Folate: 4.1 ng/mL — ABNORMAL LOW (ref >5.9)

## 2020-02-23 LAB — MAGNESIUM: Magnesium: 1.8 mg/dL (ref 1.7–2.4)

## 2020-02-23 LAB — VITAMIN B12: Vitamin B-12: 790 pg/mL (ref 180–914)

## 2020-02-23 MED ORDER — FOLIC ACID 1 MG PO TABS
1.0000 mg | ORAL_TABLET | Freq: Every day | ORAL | Status: DC
  Administered 2020-02-23 – 2020-02-24 (×2): 1 mg via ORAL
  Filled 2020-02-23 (×2): qty 1

## 2020-02-23 NOTE — Progress Notes (Signed)
PROGRESS NOTE  [name] TWS:[id number] DOB: Mar 27, 1965 DOA: 02/20/2020 PCP: [name], MD  Brief History:  55 year old female with a history of COPD, meningioma, cardiac arrest December 2020, hepatic steatosis,PNES, cognitive impairment, depression, complex partial seizure presenting with confusion, generalized weakness and a fall. Home health nurse apparently went to check on patient today and sent her to the ER because she had a witnessed fall where she hit her head, she is not able to walk. Patient is a poor historian, but reports that she was walking across the yard when something caught her foot and she fell and hit her head. She has no acute changes in vision or hearing. She reports no loss of consciousness. She lives alone home health nurse reports that she is unable to ambulate, so she is an unsafe discharge.  Patient also complained of n/v x 3 episodes without 24-48 hours of admission.  Denied diarrhea, cp, sob, abd pain.  Notably, the patient was just admitted to the hospital from 02/13/2020 to 02/14/2020 when she was treated for pneumonia as well as orthostatic hypotension and falls.  The patient was fluid resuscitated with improvement.  She was discharged home with home health PT.  Assessment/Plan: Acute metabolic Encephalopathy -multifactorial including elevated ammonia?, dehydration and dopamine agonist -d/c mirapex -discontinue lactulose -UA--no pyuria -check B12--790 -check folate--4.1-->replete -Check TSH--2.648 -02/23/20--overall improved--A&O x 3, she was able to tell me she has appointment with Dr. Tarri Glenn ( GI) on 02/28/20 -may need neuropsychology eval after d/c  Gait instability/frequent falls/dizziness -PT evaluation-->HHPT -CT brain negativeacute findings -No focal deficits on exam -Check echo -Check orthostatic vital signs-->positive HR elevated 20 with standing  Hyperammonemia -Clinical significance unclear as the patient's  mental status is improving with increasing ammonia -discontinue lactulose and monitor clinically -ammonia peaked 83  Hepatic steatosis -likely accounting for her elevated LFTs -RUQ us--neg  Complex partial seizure/PNES -Continue home dose of Keppra and Lamictal  Depression -Continue fluoxetine 80 mg daily -Continue as needed lorazepam  Meningioma -Patient follows Dr. [name] -s/p fractionated radiosurgery Sept 2020--Dr. [name] -01/18/20 MR brain--mass is stable in size as compared to the brain MRI of 09/13/2019 -no focal deficits on exam      Status is: Inpatient  Remains inpatient appropriate because:IV treatments appropriate due to intensity of illness or inability to take PO   Dispo: The patient is from: Home  Anticipated d/c is to: Home  Anticipated d/c date is: 02/24/20  Patient currently not medically stable for d/c              Difficult to place patient No        Family Communication:   Niece updated 2/10  Consultants:  none  Code Status:  FULL   DVT Prophylaxis:  [place] Heparin    Procedures: As Listed in Progress Note Above  Antibiotics: None    Subjective: Patient denies fevers, chills, headache, chest pain, dyspnea, nausea, vomiting, diarrhea, abdominal pain, dysuria, hematuria, hematochezia, and melena.   Objective: Vitals:   02/22/20 1100 02/22/20 1400 02/22/20 2126 02/23/20 0815  BP: 104/77 108/70 104/79 107/77  Pulse: 83 90 89 71  Resp:  18 18 18   Temp:  98.7 F (37.1 C) 98 F (36.7 C) 98 F (36.7 C)  TempSrc:   Oral Oral  SpO2: 97%  95% 94%  Weight:      Height:        Intake/Output Summary (Last 24 hours) at 02/23/2020 1333 Last  data filed at 02/23/2020 0900 Gross per 24 hour  Intake 1356.21 ml  Output --  Net 1356.21 ml   Weight change:  Exam:   General:  Pt is alert, follows commands appropriately, not in acute distress  HEENT: No icterus, No thrush, No  neck mass, [place]/AT  Cardiovascular: RRR, S1/S2, no rubs, no gallops  Respiratory: fine bibasilar rales. No wheeze  Abdomen: Soft/+BS, non tender, non distended, no guarding  Extremities: No edema, No lymphangitis, No petechiae, No rashes, no synovitis  Neuro:  CN II-XII intact, strength 4/5 in RUE, RLE, strength 4/5 LUE, LLE; sensation intact bilateral; no dysmetria; babinski equivocal     Data Reviewed: I have personally reviewed following labs and imaging studies Basic Metabolic Panel: Recent Labs  Lab 02/20/20 1939 02/21/20 0529 02/23/20 0550  NA 139 140 136  K 3.9 4.1 4.0  CL 107 108 108  CO2 27 25 23   GLUCOSE 83 77 79  BUN 9 9 7   CREATININE 0.60 0.53 0.43*  CALCIUM 8.9 8.7* 8.4*  MG  --  2.0 1.8   Liver Function Tests: Recent Labs  Lab 02/20/20 1939 02/21/20 0529 02/23/20 0550  AST 155* 134* 146*  ALT 96* 83* 87*  ALKPHOS 112 106 112  BILITOT 0.9 0.9 1.0  PROT 6.2* 5.8* 5.7*  ALBUMIN 2.9* 2.7* 2.7*   No results for input(s): LIPASE, AMYLASE in the last 168 hours. Recent Labs  Lab 02/21/20 0529 02/22/20 0515 02/23/20 0550  AMMONIA 78* 83* 88*   Coagulation Profile: Recent Labs  Lab 02/20/20 1939  INR 1.1   CBC: Recent Labs  Lab 02/20/20 1939 02/21/20 0529  WBC 8.0 7.8  NEUTROABS 5.6  --   HGB 11.2* 10.4*  HCT 35.0* 32.0*  MCV 93.6 93.3  PLT 175 187   Cardiac Enzymes: No results for input(s): CKTOTAL, CKMB, CKMBINDEX, TROPONINI in the last 168 hours. BNP: Invalid input(s): POCBNP CBG: No results for input(s): GLUCAP in the last 168 hours. HbA1C: No results for input(s): HGBA1C in the last 72 hours. Urine analysis:    Component Value Date/Time   COLORURINE YELLOW 02/20/2020 1826   APPEARANCEUR HAZY (A) 02/20/2020 1826   APPEARANCEUR Clear 01/30/2017 1020   LABSPEC 1.019 02/20/2020 1826   PHURINE 5.0 02/20/2020 1826   GLUCOSEU NEGATIVE 02/20/2020 1826   HGBUR NEGATIVE 02/20/2020 1826   BILIRUBINUR NEGATIVE 02/20/2020 1826    BILIRUBINUR Negative 01/30/2017 1020   KETONESUR NEGATIVE 02/20/2020 1826   PROTEINUR NEGATIVE 02/20/2020 1826   UROBILINOGEN negative 04/19/2014 1332   UROBILINOGEN 0.2 12/04/2013 1414   NITRITE NEGATIVE 02/20/2020 1826   LEUKOCYTESUR NEGATIVE 02/20/2020 1826   Sepsis Labs: @LABRCNTIP (procalcitonin:4,lacticidven:4) ) Recent Results (from the past 240 hour(s))  SARS CORONAVIRUS 2 (Tremar Wickens 6-24 HRS) Nasopharyngeal Nasopharyngeal Swab     Status: None   Collection Time: 02/20/20  7:57 PM   Specimen: Nasopharyngeal Swab  Result Value Ref Range Status   SARS Coronavirus 2 NEGATIVE NEGATIVE Final    Comment: (NOTE) SARS-CoV-2 target nucleic acids are NOT DETECTED.  The SARS-CoV-2 RNA is generally detectable in upper and lower respiratory specimens during the acute phase of infection. Negative results do not preclude SARS-CoV-2 infection, do not rule out co-infections with other pathogens, and should not be used as the sole basis for treatment or other patient management decisions. Negative results must be combined with clinical observations, patient history, and epidemiological information. The expected result is Negative.  Fact Sheet for Patients: SugarRoll.be  Fact Sheet for Healthcare Providers: https://www.woods-mathews.com/  This test is not yet approved or cleared by the [place] and  has been authorized for detection and/or diagnosis of SARS-CoV-2 by FDA under an Emergency Use Authorization (EUA). This EUA will remain  in effect (meaning this test can be used) for the duration of the COVID-19 declaration under Se ction 564(b)(1) of the Act, 21 U.S.C. section 360bbb-3(b)(1), unless the authorization is terminated or revoked sooner.  Performed at [facility], [place] [address], [place], [place] [address]      Scheduled Meds: . FLUoxetine  80 mg Oral Daily  . folic acid  1 mg Oral Daily  . gabapentin  600 mg Oral TID   . heparin  5,000 Units Subcutaneous Q8H  . lamoTRIgine  150 mg Oral BID  . levETIRAcetam  1,000 mg Oral BID   Continuous Infusions: . sodium chloride 100 mL/hr at 02/23/20 1204    Procedures/Studies: CT Head Wo Contrast  Result Date: 02/20/2020 CLINICAL DATA:  Dizziness, unsteady gait, seizure EXAM: CT HEAD WITHOUT CONTRAST TECHNIQUE: Contiguous axial images were obtained from the base of the skull through the vertex without intravenous contrast. COMPARISON:  02/12/2020 FINDINGS: Brain: No acute infarct or hemorrhage. Lateral ventricles and midline structures are unremarkable. No acute extra-axial fluid collections. No mass effect. Vascular: No hyperdense vessel or unexpected calcification. Skull: Normal. Negative for fracture or focal lesion. Sinuses/Orbits: Improved mucosal thickening within the maxillary, ethmoid, and sphenoid sinuses. Other: None. IMPRESSION: 1. No acute intracranial process. 2. Significant improvement in paranasal sinus disease. Electronically Signed   By: Randa Ngo M.D.   On: 02/20/2020 20:27   CT Head Wo Contrast  Result Date: 02/12/2020 CLINICAL DATA:  Head trauma, fell yesterday striking head, loss of consciousness, complaining of dizziness, history of seizures EXAM: CT HEAD WITHOUT CONTRAST TECHNIQUE: Contiguous axial images were obtained from the base of the skull through the vertex without intravenous contrast. COMPARISON:  06/22/2019 FINDINGS: Brain: Normal ventricular morphology. No midline shift or mass effect. Normal appearance of brain parenchyma. No intracranial hemorrhage, mass lesion, or evidence of acute infarction. No extra-axial fluid collections. Vascular: No hyperdense vessels. Minimal atherosclerotic calcification within internal carotid arteries at skull base Skull: Intact Sinuses/Orbits: Opacified RIGHT middle ear cavity. Mucosal thickening throughout the paranasal sinuses with small fluid levels in the maxillary and frontal sinuses. Other: N/A  IMPRESSION: No acute intracranial abnormalities. Scattered sinus disease changes as above. Opacified RIGHT middle ear cavity new since previous exam, recommend correlation with physical exam. Electronically Signed   By: Lavonia Dana M.D.   On: 02/12/2020 16:15   CT ABDOMEN PELVIS W CONTRAST  Result Date: 02/12/2020 CLINICAL DATA:  Abdominal trauma after fall, flank ecchymosis. Patient reports chronic lower abdominal pain. EXAM: CT ABDOMEN AND PELVIS WITH CONTRAST TECHNIQUE: Multidetector CT imaging of the abdomen and pelvis was performed using the standard protocol following bolus administration of intravenous contrast. CONTRAST:  10mL OMNIPAQUE IOHEXOL 300 MG/ML  SOLN COMPARISON:  Noncontrast CT 11/29/2017 FINDINGS: Lower chest: Right lower lobe consolidation with internal air bronchograms, series 4, image 7. Dependent opacity in the left lower lobe. Upper normal heart size. Hepatobiliary: Mild diffusely decreased hepatic density consistent with steatosis. No focal hepatic abnormality. No evidence of hepatic injury or perihepatic hematoma. Gallbladder physiologically distended, no calcified stone. No biliary dilatation. Pancreas: No acute finding. No ductal dilatation. Stable fullness in the pancreatic head. No discrete pancreatic mass. Spleen: Normal in size. No focal abnormality. No evidence of splenic injury or perisplenic hematoma. Adrenals/Urinary Tract: No adrenal nodule  or hemorrhage. No renal injury. No hydronephrosis. Bilateral nonobstructing renal calculi. No focal renal lesion. Unremarkable urinary bladder. Stomach/Bowel: Decompressed stomach, motion through the upper abdomen limits detailed assessment. No bowel obstruction, inflammation, or evident wall thickening. No evidence of bowel injury. Moderately large stool burden throughout the colon. Low lying cecum in the deep pelvis. Appendix not confidently visualized. No appendicitis. No mesenteric hematoma. Vascular/Lymphatic: Aortic and branch  atherosclerosis. No acute vascular findings are vascular injury. No retroperitoneal fluid. No enlarged lymph nodes in the abdomen or pelvis. Probable calcified node abutting the ascending colon. Reproductive: Hysterectomy. 2.7 cm left ovarian/adnexal cyst. This is not significantly changed from prior exam. Other: No free air or free fluid. No confluent soft tissue hematoma. Musculoskeletal: Left hip arthroplasty. No acute fracture of the pelvis or lumbar spine. Multilevel lumbar spondylosis. IMPRESSION: 1. No acute traumatic injury to the abdomen or pelvis. 2. Right lower lobe consolidation with internal air bronchograms, suspicious for pneumonia. Dependent opacity in the left lower lobe favors atelectasis. 3. Bilateral nonobstructing renal calculi. 4. Mild hepatic steatosis. 5. Stable 2.7 cm left ovarian/adnexal cyst. No follow-up imaging recommended. Note: This recommendation does not apply to premenarchal patients and to those with increased risk (genetic, family history, elevated tumor markers or other high-risk factors) of ovarian cancer. Reference: JACR 2020 Feb; 17(2):248-254 Aortic Atherosclerosis (ICD10-I70.0). Electronically Signed   By: Keith Rake M.D.   On: 02/12/2020 17:36   DG Chest Portable 1 View  Result Date: 02/12/2020 CLINICAL DATA:  Cough and shortness of breath EXAM: PORTABLE CHEST 1 VIEW COMPARISON:  03/03/2017 FINDINGS: Linear bandlike density above the right hemidiaphragm favors atelectasis given the configuration, less likely to be related to pneumonia. Old healed fracture of the right lateral seventh rib. Thoracic spondylosis. Heart size within normal limits for projection. The left lung appears clear. No blunting of the costophrenic angles. IMPRESSION: 1. Linear bandlike density above the right hemidiaphragm favors atelectasis over early pneumonia. Electronically Signed   By: Van Clines M.D.   On: 02/12/2020 15:59   US Abdomen Limited RUQ (LIVER/GB)  Result Date:  02/23/2020 CLINICAL DATA:  Transaminitis EXAM: ULTRASOUND ABDOMEN LIMITED RIGHT UPPER QUADRANT COMPARISON:  CT abdomen pelvis 02/12/2020 FINDINGS: Gallbladder: No gallstones or wall thickening visualized. No sonographic Murphy sign noted by sonographer. Common bile duct: Diameter: 5 mm Liver: No focal lesion identified. Within normal limits in parenchymal echogenicity. Portal vein is patent on color Doppler imaging with normal direction of blood flow towards the liver. Other: None. IMPRESSION: No significant sonographic abnormality of the liver are gallbladder. Electronically Signed   By: Miachel Roux M.D.   On: 02/23/2020 11:01    Orson Eva, DO  [facility]  If 7PM-7AM, please contact night-coverage [email or web address] Password [email or web address] 02/23/2020, 1:33 PM   LOS: 2 days

## 2020-02-23 NOTE — Progress Notes (Signed)
*  PRELIMINARY RESULTS* Echocardiogram 2D Echocardiogram has been performed.  Leavy Cella 02/23/2020, 9:29 AM

## 2020-02-24 DIAGNOSIS — R7401 Elevation of levels of liver transaminase levels: Secondary | ICD-10-CM | POA: Diagnosis not present

## 2020-02-24 DIAGNOSIS — R7989 Other specified abnormal findings of blood chemistry: Secondary | ICD-10-CM | POA: Diagnosis not present

## 2020-02-24 DIAGNOSIS — G9341 Metabolic encephalopathy: Secondary | ICD-10-CM | POA: Diagnosis not present

## 2020-02-24 DIAGNOSIS — R413 Other amnesia: Secondary | ICD-10-CM

## 2020-02-24 LAB — COMPREHENSIVE METABOLIC PANEL
ALT: 89 U/L — ABNORMAL HIGH (ref 0–44)
AST: 155 U/L — ABNORMAL HIGH (ref 15–41)
Albumin: 2.6 g/dL — ABNORMAL LOW (ref 3.5–5.0)
Alkaline Phosphatase: 119 U/L (ref 38–126)
Anion gap: 7 (ref 5–15)
BUN: 7 mg/dL (ref 6–20)
CO2: 23 mmol/L (ref 22–32)
Calcium: 8.5 mg/dL — ABNORMAL LOW (ref 8.9–10.3)
Chloride: 110 mmol/L (ref 98–111)
Creatinine, Ser: 0.5 mg/dL (ref 0.44–1.00)
GFR, Estimated: >60 mL/min (ref >60)
Glucose, Bld: 83 mg/dL (ref 70–99)
Potassium: 3.8 mmol/L (ref 3.5–5.1)
Sodium: 140 mmol/L (ref 135–145)
Total Bilirubin: 0.9 mg/dL (ref 0.3–1.2)
Total Protein: 5.7 g/dL — ABNORMAL LOW (ref 6.5–8.1)

## 2020-02-24 LAB — ECHOCARDIOGRAM COMPLETE
AR max vel: 2.34 cm2
AV Area VTI: 2.3 cm2
AV Area mean vel: 2.33 cm2
AV Mean grad: 2.3 mmHg
AV Peak grad: 4.4 mmHg
Ao pk vel: 1.05 m/s
Area-P 1/2: 3.72 cm2
Height: 61 in
S' Lateral: 2.36 cm
Weight: 2127 oz

## 2020-02-24 LAB — CBC
HCT: 32.4 % — ABNORMAL LOW (ref 36.0–46.0)
Hemoglobin: 10.5 g/dL — ABNORMAL LOW (ref 12.0–15.0)
MCH: 30.2 pg (ref 26.0–34.0)
MCHC: 32.4 g/dL (ref 30.0–36.0)
MCV: 93.1 fL (ref 80.0–100.0)
Platelets: 175 10*3/uL (ref 150–400)
RBC: 3.48 MIL/uL — ABNORMAL LOW (ref 3.87–5.11)
RDW: 15.1 % (ref 11.5–15.5)
WBC: 7.1 10*3/uL (ref 4.0–10.5)
nRBC: 0 % (ref 0.0–0.2)

## 2020-02-24 LAB — MAGNESIUM: Magnesium: 1.8 mg/dL (ref 1.7–2.4)

## 2020-02-24 LAB — AMMONIA: Ammonia: 57 umol/L — ABNORMAL HIGH (ref 9–35)

## 2020-02-24 MED ORDER — FOLIC ACID 1 MG PO TABS: 1.0000 mg | ORAL_TABLET | Freq: Every day | ORAL | Status: AC

## 2020-02-24 NOTE — Discharge Summary (Signed)
Physician Discharge Summary  [name]:[id number] DOB: Jun 01, 1965 DOA: 02/20/2020  PCP: Monica Arnt, MD  Admit date: 02/20/2020 Discharge date: 02/24/2020  Admitted From: Home Disposition:  Home   Recommendations for Outpatient Follow-up:  1. Follow up with PCP in 1-2 weeks 2. Please obtain BMP/CBC in one week   Home Health: yes Equipment/Devices: HHPT, RN  Discharge Condition: Stable CODE STATUS: FULL Diet recommendation: Heart Healthy    Brief/Interim Summary: 55 year old female with a history of COPD, meningioma, cardiac arrest December 2020, hepatic steatosis,PNES, cognitive impairment, depression, complex partial seizure presenting withconfusion, generalized weakness and a fall. Home health nurse apparently went to check on patient today and sent her to the ER because she had a witnessed fall where she hit her head, she is not able to walk. Patient is a poor historian, but reports that she was walking across the yard when something caught her foot and she fell and hit her head. She has no acute changes in vision or hearing. She reports no loss of consciousness. She lives alone home health nurse reports that she is unable to ambulate, so she is an unsafe discharge. Patient also complained of n/v x 3 episodes without 24-48 hours of admission. Denied diarrhea, cp, sob, abd pain.  Notably, the patient was just admitted to the hospital from 1/31/2022to2/01/2020 when she was treated for pneumonia as well as orthostatic hypotension and falls. The patient was fluid resuscitated with improvement. She was discharged home with home health PT.  Discharge Diagnoses:   Acute metabolic Encephalopathy -multifactorial including elevated ammonia?, dehydration and dopamine agonist -d/c mirapex -discontinue lactulose -UA--no pyuria -check B12--790 -check folate--4.1-->replete -Check TSH--2.648 -02/23/20--overall improved--A&O x 4, she was able to tell me she has  appointment with Dr. Modena [name] ([facility]) on 02/28/20 at 10:15AM -neuropsychology eval after d/c--outpt referral made  -unclear if her mental status issues are all medically related vs some underlying cognitive impairment  Gait instability/frequent falls/dizziness -PT evaluation-->HHPT -CT brain negativeacute findings -No focal deficits on exam -Check echo--EF 60-65%, no WMA -Check orthostatic vital signs-->positive HR elevated 20 with standing -02/24/20--I personally repeated orthostatics--improved after fluid resuscitation -she will be staying with her mother after discharge  Hyperammonemia -Clinical significance unclear as the patient's mental status is improving with increasing ammonia -discontinue lactuloseand monitor clinically -ammonia peaked 83 -ammonia 57 on day of d/c off of lactulose x 48-72 hours  Hepatic steatosis -likely accounting for her elevated LFTs -RUQ us--neg -she has appt with [name] on 02/28/20  Complex partial seizure/PNES -Continue home dose of Keppra and Lamictal  Depression -Continue fluoxetine 80 mg daily -Continue as needed lorazepam  Meningioma -Patient follows Dr. Mickeal [name] -s/p fractionated radiosurgery Sept 2020--Dr. Isidore [name] -01/18/20 MR brain--mass is stable in size as compared to the brain MRI of 09/13/2019 -no focal deficits on exam   Discharge Instructions  Discharge Instructions    Ambulatory referral to Neuropsychology   Complete by: As directed      Allergies as of 02/24/2020      Reactions   Acetaminophen Other (See Comments)   Has fatty deposits on liver   Benadryl [diphenhydramine Hcl] Other (See Comments)   hyperactivity and seizures   Codeine Itching, Rash   seizures   Dilantin [phenytoin Sodium Extended] Other (See Comments)   Elevated LFT's   Melatonin Other (See Comments)   seizures   Tramadol Other (See Comments)   "causes seizures"   Ultram [tramadol Hcl] Other (See Comments)   Seizures   Vimpat  [lacosamide] Other (  See Comments)   Severe dizziness   Mirtazapine Other (See Comments)   Potentiated seizure activity   Betadine [povidone Iodine] Rash   Latex Rash   Penicillins Itching, Rash   Has patient had a PCN reaction causing immediate rash, facial/tongue/throat swelling, SOB or lightheadedness with hypotension: No Has patient had a PCN reaction causing severe rash involving mucus membranes or skin necrosis: No Has patient had a PCN reaction that required hospitalization No Has patient had a PCN reaction occurring within the last 10 years: No If all of the above answers are "NO", then may proceed with Cephalosporin use.   Sulfa Antibiotics Rash   Tape Rash, Other (See Comments)   Paper tape please Paper tape please   Vicodin [hydrocodone-acetaminophen] Itching      Medication List    STOP taking these medications   benzonatate 100 MG capsule Commonly known as: TESSALON   pramipexole 0.5 MG tablet Commonly known as: MIRAPEX     TAKE these medications   FLUoxetine 40 MG capsule Commonly known as: PROZAC Take 2 capsules (80 mg total) by mouth daily.   folic acid 1 MG tablet Commonly known as: FOLVITE Take 1 tablet (1 mg total) by mouth daily.   gabapentin 600 MG tablet Commonly known as: NEURONTIN TAKE 1 TABLET BY MOUTH THREE TIMES DAILY What changed: when to take this   lamoTRIgine 150 MG tablet Commonly known as: LAMICTAL Take 1 tablet (150 mg total) by mouth 2 (two) times daily. For mood control What changed: additional instructions   levETIRAcetam 1000 MG tablet Commonly known as: KEPPRA Take 1 in the morning and 1 tablet at bedtime. What changed:   how much to take  how to take this  when to take this  additional instructions   LORazepam 1 MG tablet Commonly known as: ATIVAN Take 1 tablet (1 mg total) by mouth daily as needed for anxiety.       Follow-up Information    [facility].   Specialty: [place] Why: RN/ PT will call to set up home appointment             Allergies  Allergen Reactions  . Acetaminophen Other (See Comments)    Has fatty deposits on liver  . Benadryl [Diphenhydramine Hcl] Other (See Comments)    hyperactivity and seizures  . Codeine Itching and Rash    seizures  . Dilantin [Phenytoin Sodium Extended] Other (See Comments)    Elevated LFT's  . Melatonin Other (See Comments)    seizures  . Tramadol Other (See Comments)    "causes seizures"  . Ultram [Tramadol Hcl] Other (See Comments)    Seizures  . Vimpat [Lacosamide] Other (See Comments)    Severe dizziness  . Mirtazapine Other (See Comments)    Potentiated seizure activity  . Betadine [Povidone Iodine] Rash  . Latex Rash  . Penicillins Itching and Rash    Has patient had a PCN reaction causing immediate rash, facial/tongue/throat swelling, SOB or lightheadedness with hypotension: No Has patient had a PCN reaction causing severe rash involving mucus membranes or skin necrosis: No Has patient had a PCN reaction that required hospitalization No Has patient had a PCN reaction occurring within the last 10 years: No If all of the above answers are "NO", then may proceed with Cephalosporin use.  . Sulfa Antibiotics Rash  . Tape Rash and Other (See Comments)    Paper tape please Paper tape please  . Vicodin [Hydrocodone-Acetaminophen]  Itching    Consultations:  none   Procedures/Studies: CT Head Wo Contrast  Result Date: 02/20/2020 CLINICAL DATA:  Dizziness, unsteady gait, seizure EXAM: CT HEAD WITHOUT CONTRAST TECHNIQUE: Contiguous axial images were obtained from the base of the skull through the vertex without intravenous contrast. COMPARISON:  02/12/2020 FINDINGS: Brain: No acute infarct or hemorrhage. Lateral ventricles and midline structures are unremarkable. No acute extra-axial fluid collections. No mass effect. Vascular: No hyperdense vessel or unexpected calcification. Skull: Normal.  Negative for fracture or focal lesion. Sinuses/Orbits: Improved mucosal thickening within the maxillary, ethmoid, and sphenoid sinuses. Other: None. IMPRESSION: 1. No acute intracranial process. 2. Significant improvement in paranasal sinus disease. Electronically Signed   By: Randa Ngo M.D.   On: 02/20/2020 20:27   CT Head Wo Contrast  Result Date: 02/12/2020 CLINICAL DATA:  Head trauma, fell yesterday striking head, loss of consciousness, complaining of dizziness, history of seizures EXAM: CT HEAD WITHOUT CONTRAST TECHNIQUE: Contiguous axial images were obtained from the base of the skull through the vertex without intravenous contrast. COMPARISON:  06/22/2019 FINDINGS: Brain: Normal ventricular morphology. No midline shift or mass effect. Normal appearance of brain parenchyma. No intracranial hemorrhage, mass lesion, or evidence of acute infarction. No extra-axial fluid collections. Vascular: No hyperdense vessels. Minimal atherosclerotic calcification within internal carotid arteries at skull base Skull: Intact Sinuses/Orbits: Opacified RIGHT middle ear cavity. Mucosal thickening throughout the paranasal sinuses with small fluid levels in the maxillary and frontal sinuses. Other: N/A IMPRESSION: No acute intracranial abnormalities. Scattered sinus disease changes as above. Opacified RIGHT middle ear cavity new since previous exam, recommend correlation with physical exam. Electronically Signed   By: Lavonia Dana M.D.   On: 02/12/2020 16:15   CT ABDOMEN PELVIS W CONTRAST  Result Date: 02/12/2020 CLINICAL DATA:  Abdominal trauma after fall, flank ecchymosis. Patient reports chronic lower abdominal pain. EXAM: CT ABDOMEN AND PELVIS WITH CONTRAST TECHNIQUE: Multidetector CT imaging of the abdomen and pelvis was performed using the standard protocol following bolus administration of intravenous contrast. CONTRAST:  72mL OMNIPAQUE IOHEXOL 300 MG/ML  SOLN COMPARISON:  Noncontrast CT 11/29/2017 FINDINGS:  Lower chest: Right lower lobe consolidation with internal air bronchograms, series 4, image 7. Dependent opacity in the left lower lobe. Upper normal heart size. Hepatobiliary: Mild diffusely decreased hepatic density consistent with steatosis. No focal hepatic abnormality. No evidence of hepatic injury or perihepatic hematoma. Gallbladder physiologically distended, no calcified stone. No biliary dilatation. Pancreas: No acute finding. No ductal dilatation. Stable fullness in the pancreatic head. No discrete pancreatic mass. Spleen: Normal in size. No focal abnormality. No evidence of splenic injury or perisplenic hematoma. Adrenals/Urinary Tract: No adrenal nodule or hemorrhage. No renal injury. No hydronephrosis. Bilateral nonobstructing renal calculi. No focal renal lesion. Unremarkable urinary bladder. Stomach/Bowel: Decompressed stomach, motion through the upper abdomen limits detailed assessment. No bowel obstruction, inflammation, or evident wall thickening. No evidence of bowel injury. Moderately large stool burden throughout the colon. Low lying cecum in the deep pelvis. Appendix not confidently visualized. No appendicitis. No mesenteric hematoma. Vascular/Lymphatic: Aortic and branch atherosclerosis. No acute vascular findings are vascular injury. No retroperitoneal fluid. No enlarged lymph nodes in the abdomen or pelvis. Probable calcified node abutting the ascending colon. Reproductive: Hysterectomy. 2.7 cm left ovarian/adnexal cyst. This is not significantly changed from prior exam. Other: No free air or free fluid. No confluent soft tissue hematoma. Musculoskeletal: Left hip arthroplasty. No acute fracture of the pelvis or lumbar spine. Multilevel lumbar spondylosis. IMPRESSION: 1. No acute traumatic injury to  the abdomen or pelvis. 2. Right lower lobe consolidation with internal air bronchograms, suspicious for pneumonia. Dependent opacity in the left lower lobe favors atelectasis. 3. Bilateral  nonobstructing renal calculi. 4. Mild hepatic steatosis. 5. Stable 2.7 cm left ovarian/adnexal cyst. No follow-up imaging recommended. Note: This recommendation does not apply to premenarchal patients and to those with increased risk (genetic, family history, elevated tumor markers or other high-risk factors) of ovarian cancer. Reference: JACR 2020 Feb; 17(2):248-254 Aortic Atherosclerosis (ICD10-I70.0). Electronically Signed   By: Keith Rake M.D.   On: 02/12/2020 17:36   DG Chest Portable 1 View  Result Date: 02/12/2020 CLINICAL DATA:  Cough and shortness of breath EXAM: PORTABLE CHEST 1 VIEW COMPARISON:  03/03/2017 FINDINGS: Linear bandlike density above the right hemidiaphragm favors atelectasis given the configuration, less likely to be related to pneumonia. Old healed fracture of the right lateral seventh rib. Thoracic spondylosis. Heart size within normal limits for projection. The left lung appears clear. No blunting of the costophrenic angles. IMPRESSION: 1. Linear bandlike density above the right hemidiaphragm favors atelectasis over early pneumonia. Electronically Signed   By: Van Clines M.D.   On: 02/12/2020 15:59   ECHOCARDIOGRAM COMPLETE  Result Date: 02/24/2020    ECHOCARDIOGRAM REPORT   Patient Name:   SHAYLAN TUTTON Date of Exam: 02/23/2020 Medical Rec #:  [id number]       Height:       61.0 in Accession #:    [id number]      Weight:       132.9 lb Date of Birth:  [birth year]        BSA:          1.588 m Patient Age:    89 years        BP:           107/77 mmHg Patient Gender: F               HR:           71 bpm. Exam Location:  [facility] Procedure: 2D Echo Indications:     Syncope 780.2 / R55  History:         Patient has prior history of Echocardiogram examinations, most                  recent 06/25/2017. Risk Factors:Non-Smoker. Cardiac Arrest,                  Elevated Liver Function Test.  Sonographer:     Leavy Cella RDCS (AE) Referring Phys:  [phone] Kamiyah Kindel Diagnosing  Phys: Carlyle Dolly MD IMPRESSIONS  1. Left ventricular ejection fraction, by estimation, is 60 to 65%. The left ventricle has normal function. The left ventricle has no regional wall motion abnormalities. Left ventricular diastolic parameters are indeterminate.  2. Right ventricular systolic function is normal. The right ventricular size is normal.  3. Left atrial size was mildly dilated.  4. The mitral valve is normal in structure. No evidence of mitral valve regurgitation. No evidence of mitral stenosis.  5. The aortic valve has an indeterminant number of cusps. Aortic valve regurgitation is not visualized. No aortic stenosis is present.  6. The inferior vena cava is normal in size with greater than 50% respiratory variability, suggesting right atrial pressure of 3 mmHg. FINDINGS  Left Ventricle: Left ventricular ejection fraction, by estimation, is 60 to 65%. The left ventricle has normal function. The left ventricle has no regional wall motion abnormalities. The left  ventricular internal cavity size was normal in size. There is  no left ventricular hypertrophy. Left ventricular diastolic parameters are indeterminate. Right Ventricle: The right ventricular size is normal. No increase in right ventricular wall thickness. Right ventricular systolic function is normal. Left Atrium: Left atrial size was mildly dilated. Right Atrium: Right atrial size was normal in size. Pericardium: There is no evidence of pericardial effusion. Mitral Valve: The mitral valve is normal in structure. No evidence of mitral valve regurgitation. No evidence of mitral valve stenosis. Tricuspid Valve: The tricuspid valve is normal in structure. Tricuspid valve regurgitation is not demonstrated. No evidence of tricuspid stenosis. Aortic Valve: The aortic valve has an indeterminant number of cusps. Aortic valve regurgitation is not visualized. No aortic stenosis is present. Aortic valve mean gradient measures 2.3 mmHg. Aortic valve peak  gradient measures 4.4 mmHg. Aortic valve area, by VTI measures 2.30 cm. Pulmonic Valve: The pulmonic valve was not well visualized. Pulmonic valve regurgitation is not visualized. No evidence of pulmonic stenosis. Aorta: The aortic root is normal in size and structure. Pulmonary Artery: Indeterminant PASP, inadequate TR jet. Venous: The inferior vena cava is normal in size with greater than 50% respiratory variability, suggesting right atrial pressure of 3 mmHg. IAS/Shunts: No atrial level shunt detected by color flow Doppler.  LEFT VENTRICLE PLAX 2D LVIDd:         3.96 cm  Diastology LVIDs:         2.36 cm  LV e' medial:    7.83 cm/s LV PW:         1.00 cm  LV E/e' medial:  12.8 LV IVS:        0.97 cm  LV e' lateral:   8.38 cm/s LVOT diam:     1.80 cm  LV E/e' lateral: 11.9 LV SV:         50 LV SV Index:   32 LVOT Area:     2.54 cm  LEFT ATRIUM           Index       RIGHT ATRIUM          Index LA diam:      3.50 cm 2.20 cm/m  RA Area:     7.65 cm LA Vol (A2C): 31.0 ml 19.52 ml/m RA Volume:   14.30 ml 9.01 ml/m LA Vol (A4C): 52.9 ml 33.32 ml/m  AORTIC VALVE AV Area (Vmax):    2.34 cm AV Area (Vmean):   2.33 cm AV Area (VTI):     2.30 cm AV Vmax:           104.71 cm/s AV Vmean:          71.013 cm/s AV VTI:            0.218 m AV Peak Grad:      4.4 mmHg AV Mean Grad:      2.3 mmHg LVOT Vmax:         96.27 cm/s LVOT Vmean:        64.940 cm/s LVOT VTI:          0.197 m LVOT/AV VTI ratio: 0.90  AORTA Ao Root diam: 2.50 cm MITRAL VALVE MV Area (PHT): 3.72 cm     SHUNTS MV Decel Time: 204 msec     Systemic VTI:  0.20 m MV E velocity: 100.00 cm/s  Systemic Diam: 1.80 cm MV A velocity: 74.40 cm/s MV E/A ratio:  1.34 Carlyle Dolly MD Electronically signed by Carlyle Dolly MD  Signature Date/Time: 02/23/2020/12:29:20 PM    Final (Updated)    US Abdomen Limited RUQ (LIVER/GB)  Result Date: 02/23/2020 CLINICAL DATA:  Transaminitis EXAM: ULTRASOUND ABDOMEN LIMITED RIGHT UPPER QUADRANT COMPARISON:  CT abdomen  pelvis 02/12/2020 FINDINGS: Gallbladder: No gallstones or wall thickening visualized. No sonographic Murphy sign noted by sonographer. Common bile duct: Diameter: 5 mm Liver: No focal lesion identified. Within normal limits in parenchymal echogenicity. Portal vein is patent on color Doppler imaging with normal direction of blood flow towards the liver. Other: None. IMPRESSION: No significant sonographic abnormality of the liver are gallbladder. Electronically Signed   By: Miachel Roux M.D.   On: 02/23/2020 11:01         Discharge Exam: Vitals:   02/23/20 2050 02/24/20 0552  BP: 114/85 105/70  Pulse: 76 71  Resp: 18 18  Temp: 98.2 F (36.8 C) 98.5 F (36.9 C)  SpO2: 97% 95%   Vitals:   02/23/20 1124 02/23/20 1400 02/23/20 2050 02/24/20 0552  BP:  104/73 114/85 105/70  Pulse:  77 76 71  Resp:  18 18 18   Temp:  98.1 F (36.7 C) 98.2 F (36.8 C) 98.5 F (36.9 C)  TempSrc:  Oral Oral Oral  SpO2: 94% 95% 97% 95%  Weight:    59.5 kg  Height:        General: Pt is alert, awake, not in acute distress Cardiovascular: RRR, S1/S2 +, no rubs, no gallops Respiratory: CTA bilaterally, no wheezing, no rhonchi Abdominal: Soft, NT, ND, bowel sounds + Extremities: no edema, no cyanosis   The results of significant diagnostics from this hospitalization (including imaging, microbiology, ancillary and laboratory) are listed below for reference.    Significant Diagnostic Studies: CT Head Wo Contrast  Result Date: 02/20/2020 CLINICAL DATA:  Dizziness, unsteady gait, seizure EXAM: CT HEAD WITHOUT CONTRAST TECHNIQUE: Contiguous axial images were obtained from the base of the skull through the vertex without intravenous contrast. COMPARISON:  02/12/2020 FINDINGS: Brain: No acute infarct or hemorrhage. Lateral ventricles and midline structures are unremarkable. No acute extra-axial fluid collections. No mass effect. Vascular: No hyperdense vessel or unexpected calcification. Skull: Normal. Negative  for fracture or focal lesion. Sinuses/Orbits: Improved mucosal thickening within the maxillary, ethmoid, and sphenoid sinuses. Other: None. IMPRESSION: 1. No acute intracranial process. 2. Significant improvement in paranasal sinus disease. Electronically Signed   By: Randa Ngo M.D.   On: 02/20/2020 20:27   CT Head Wo Contrast  Result Date: 02/12/2020 CLINICAL DATA:  Head trauma, fell yesterday striking head, loss of consciousness, complaining of dizziness, history of seizures EXAM: CT HEAD WITHOUT CONTRAST TECHNIQUE: Contiguous axial images were obtained from the base of the skull through the vertex without intravenous contrast. COMPARISON:  06/22/2019 FINDINGS: Brain: Normal ventricular morphology. No midline shift or mass effect. Normal appearance of brain parenchyma. No intracranial hemorrhage, mass lesion, or evidence of acute infarction. No extra-axial fluid collections. Vascular: No hyperdense vessels. Minimal atherosclerotic calcification within internal carotid arteries at skull base Skull: Intact Sinuses/Orbits: Opacified RIGHT middle ear cavity. Mucosal thickening throughout the paranasal sinuses with small fluid levels in the maxillary and frontal sinuses. Other: N/A IMPRESSION: No acute intracranial abnormalities. Scattered sinus disease changes as above. Opacified RIGHT middle ear cavity new since previous exam, recommend correlation with physical exam. Electronically Signed   By: Lavonia Dana M.D.   On: 02/12/2020 16:15   CT ABDOMEN PELVIS W CONTRAST  Result Date: 02/12/2020 CLINICAL DATA:  Abdominal trauma after fall, flank ecchymosis. Patient reports chronic lower  abdominal pain. EXAM: CT ABDOMEN AND PELVIS WITH CONTRAST TECHNIQUE: Multidetector CT imaging of the abdomen and pelvis was performed using the standard protocol following bolus administration of intravenous contrast. CONTRAST:  3mL OMNIPAQUE IOHEXOL 300 MG/ML  SOLN COMPARISON:  Noncontrast CT 11/29/2017 FINDINGS: Lower chest:  Right lower lobe consolidation with internal air bronchograms, series 4, image 7. Dependent opacity in the left lower lobe. Upper normal heart size. Hepatobiliary: Mild diffusely decreased hepatic density consistent with steatosis. No focal hepatic abnormality. No evidence of hepatic injury or perihepatic hematoma. Gallbladder physiologically distended, no calcified stone. No biliary dilatation. Pancreas: No acute finding. No ductal dilatation. Stable fullness in the pancreatic head. No discrete pancreatic mass. Spleen: Normal in size. No focal abnormality. No evidence of splenic injury or perisplenic hematoma. Adrenals/Urinary Tract: No adrenal nodule or hemorrhage. No renal injury. No hydronephrosis. Bilateral nonobstructing renal calculi. No focal renal lesion. Unremarkable urinary bladder. Stomach/Bowel: Decompressed stomach, motion through the upper abdomen limits detailed assessment. No bowel obstruction, inflammation, or evident wall thickening. No evidence of bowel injury. Moderately large stool burden throughout the colon. Low lying cecum in the deep pelvis. Appendix not confidently visualized. No appendicitis. No mesenteric hematoma. Vascular/Lymphatic: Aortic and branch atherosclerosis. No acute vascular findings are vascular injury. No retroperitoneal fluid. No enlarged lymph nodes in the abdomen or pelvis. Probable calcified node abutting the ascending colon. Reproductive: Hysterectomy. 2.7 cm left ovarian/adnexal cyst. This is not significantly changed from prior exam. Other: No free air or free fluid. No confluent soft tissue hematoma. Musculoskeletal: Left hip arthroplasty. No acute fracture of the pelvis or lumbar spine. Multilevel lumbar spondylosis. IMPRESSION: 1. No acute traumatic injury to the abdomen or pelvis. 2. Right lower lobe consolidation with internal air bronchograms, suspicious for pneumonia. Dependent opacity in the left lower lobe favors atelectasis. 3. Bilateral nonobstructing  renal calculi. 4. Mild hepatic steatosis. 5. Stable 2.7 cm left ovarian/adnexal cyst. No follow-up imaging recommended. Note: This recommendation does not apply to premenarchal patients and to those with increased risk (genetic, family history, elevated tumor markers or other high-risk factors) of ovarian cancer. Reference: JACR 2020 Feb; 17(2):248-254 Aortic Atherosclerosis (ICD10-I70.0). Electronically Signed   By: Keith Rake M.D.   On: 02/12/2020 17:36   DG Chest Portable 1 View  Result Date: 02/12/2020 CLINICAL DATA:  Cough and shortness of breath EXAM: PORTABLE CHEST 1 VIEW COMPARISON:  03/03/2017 FINDINGS: Linear bandlike density above the right hemidiaphragm favors atelectasis given the configuration, less likely to be related to pneumonia. Old healed fracture of the right lateral seventh rib. Thoracic spondylosis. Heart size within normal limits for projection. The left lung appears clear. No blunting of the costophrenic angles. IMPRESSION: 1. Linear bandlike density above the right hemidiaphragm favors atelectasis over early pneumonia. Electronically Signed   By: Van Clines M.D.   On: 02/12/2020 15:59   ECHOCARDIOGRAM COMPLETE  Result Date: 02/24/2020    ECHOCARDIOGRAM REPORT   Patient Name:   [name] Date of Exam: 02/23/2020 Medical Rec #:  [id number]       Height:       61.0 in Accession #:    [id number]      Weight:       132.9 lb Date of Birth:  02-15-1965        BSA:          1.588 m Patient Age:    55 years        BP:           107/77  mmHg Patient Gender: F               HR:           71 bpm. Exam Location:  [facility] Procedure: 2D Echo Indications:     Syncope 780.2 / R55  History:         Patient has prior history of Echocardiogram examinations, most                  recent 06/25/2017. Risk Factors:Non-Smoker. Cardiac Arrest,                  Elevated Liver Function Test.  Sonographer:     Leavy Cella RDCS (AE) Referring Phys:  [id number] Kuba Shepherd Diagnosing Phys: Carlyle Dolly MD IMPRESSIONS  1. Left ventricular ejection fraction, by estimation, is 60 to 65%. The left ventricle has normal function. The left ventricle has no regional wall motion abnormalities. Left ventricular diastolic parameters are indeterminate.  2. Right ventricular systolic function is normal. The right ventricular size is normal.  3. Left atrial size was mildly dilated.  4. The mitral valve is normal in structure. No evidence of mitral valve regurgitation. No evidence of mitral stenosis.  5. The aortic valve has an indeterminant number of cusps. Aortic valve regurgitation is not visualized. No aortic stenosis is present.  6. The inferior vena cava is normal in size with greater than 50% respiratory variability, suggesting right atrial pressure of 3 mmHg. FINDINGS  Left Ventricle: Left ventricular ejection fraction, by estimation, is 60 to 65%. The left ventricle has normal function. The left ventricle has no regional wall motion abnormalities. The left ventricular internal cavity size was normal in size. There is  no left ventricular hypertrophy. Left ventricular diastolic parameters are indeterminate. Right Ventricle: The right ventricular size is normal. No increase in right ventricular wall thickness. Right ventricular systolic function is normal. Left Atrium: Left atrial size was mildly dilated. Right Atrium: Right atrial size was normal in size. Pericardium: There is no evidence of pericardial effusion. Mitral Valve: The mitral valve is normal in structure. No evidence of mitral valve regurgitation. No evidence of mitral valve stenosis. Tricuspid Valve: The tricuspid valve is normal in structure. Tricuspid valve regurgitation is not demonstrated. No evidence of tricuspid stenosis. Aortic Valve: The aortic valve has an indeterminant number of cusps. Aortic valve regurgitation is not visualized. No aortic stenosis is present. Aortic valve mean gradient measures 2.3 mmHg. Aortic valve peak gradient measures  4.4 mmHg. Aortic valve area, by VTI measures 2.30 cm. Pulmonic Valve: The pulmonic valve was not well visualized. Pulmonic valve regurgitation is not visualized. No evidence of pulmonic stenosis. Aorta: The aortic root is normal in size and structure. Pulmonary Artery: Indeterminant PASP, inadequate TR jet. Venous: The inferior vena cava is normal in size with greater than 50% respiratory variability, suggesting right atrial pressure of 3 mmHg. IAS/Shunts: No atrial level shunt detected by color flow Doppler.  LEFT VENTRICLE PLAX 2D LVIDd:         3.96 cm  Diastology LVIDs:         2.36 cm  LV e' medial:    7.83 cm/s LV PW:         1.00 cm  LV E/e' medial:  12.8 LV IVS:        0.97 cm  LV e' lateral:   8.38 cm/s LVOT diam:     1.80 cm  LV E/e' lateral: 11.9 LV SV:  50 LV SV Index:   32 LVOT Area:     2.54 cm  LEFT ATRIUM           Index       RIGHT ATRIUM          Index LA diam:      3.50 cm 2.20 cm/m  RA Area:     7.65 cm LA Vol (A2C): 31.0 ml 19.52 ml/m RA Volume:   14.30 ml 9.01 ml/m LA Vol (A4C): 52.9 ml 33.32 ml/m  AORTIC VALVE AV Area (Vmax):    2.34 cm AV Area (Vmean):   2.33 cm AV Area (VTI):     2.30 cm AV Vmax:           104.71 cm/s AV Vmean:          71.013 cm/s AV VTI:            0.218 m AV Peak Grad:      4.4 mmHg AV Mean Grad:      2.3 mmHg LVOT Vmax:         96.27 cm/s LVOT Vmean:        64.940 cm/s LVOT VTI:          0.197 m LVOT/AV VTI ratio: 0.90  AORTA Ao Root diam: 2.50 cm MITRAL VALVE MV Area (PHT): 3.72 cm     SHUNTS MV Decel Time: 204 msec     Systemic VTI:  0.20 m MV E velocity: 100.00 cm/s  Systemic Diam: 1.80 cm MV A velocity: 74.40 cm/s MV E/A ratio:  1.34 Carlyle Dolly MD Electronically signed by Carlyle Dolly MD Signature Date/Time: 02/23/2020/12:29:20 PM    Final (Updated)    US Abdomen Limited RUQ (LIVER/GB)  Result Date: 02/23/2020 CLINICAL DATA:  Transaminitis EXAM: ULTRASOUND ABDOMEN LIMITED RIGHT UPPER QUADRANT COMPARISON:  CT abdomen pelvis 02/12/2020  FINDINGS: Gallbladder: No gallstones or wall thickening visualized. No sonographic Murphy sign noted by sonographer. Common bile duct: Diameter: 5 mm Liver: No focal lesion identified. Within normal limits in parenchymal echogenicity. Portal vein is patent on color Doppler imaging with normal direction of blood flow towards the liver. Other: None. IMPRESSION: No significant sonographic abnormality of the liver are gallbladder. Electronically Signed   By: Miachel Roux M.D.   On: 02/23/2020 11:01     Microbiology: Recent Results (from the past 240 hour(s))  SARS CORONAVIRUS 2 (Jailani Hogans 6-24 HRS) Nasopharyngeal Nasopharyngeal Swab     Status: None   Collection Time: 02/20/20  7:57 PM   Specimen: Nasopharyngeal Swab  Result Value Ref Range Status   SARS Coronavirus 2 NEGATIVE NEGATIVE Final    Comment: (NOTE) SARS-CoV-2 target nucleic acids are NOT DETECTED.  The SARS-CoV-2 RNA is generally detectable in upper and lower respiratory specimens during the acute phase of infection. Negative results do not preclude SARS-CoV-2 infection, do not rule out co-infections with other pathogens, and should not be used as the sole basis for treatment or other patient management decisions. Negative results must be combined with clinical observations, patient history, and epidemiological information. The expected result is Negative.  Fact Sheet for Patients: SugarRoll.be  Fact Sheet for Healthcare Providers: https://www.woods-mathews.com/  This test is not yet approved or cleared by the [place] FDA and  has been authorized for detection and/or diagnosis of SARS-CoV-2 by FDA under an Emergency Use Authorization (EUA). This EUA will remain  in effect (meaning this test can be used) for the duration of the COVID-19 declaration under Se ction 564(b)(1) of  the Act, 21 U.S.C. section 360bbb-3(b)(1), unless the authorization is terminated or revoked  sooner.  Performed at [facility], [place] [address] [place], [place] [address]      Labs: Basic Metabolic Panel: Recent Labs  Lab 02/20/20 1939 02/21/20 0529 02/23/20 0550 02/24/20 0556  NA 139 140 136 140  K 3.9 4.1 4.0 3.8  CL 107 108 108 110  CO2 27 25 23 23   GLUCOSE 83 77 79 83  BUN 9 9 7 7   CREATININE 0.60 0.53 0.43* 0.50  CALCIUM 8.9 8.7* 8.4* 8.5*  MG  --  2.0 1.8 1.8   Liver Function Tests: Recent Labs  Lab 02/20/20 1939 02/21/20 0529 02/23/20 0550 02/24/20 0556  AST 155* 134* 146* 155*  ALT 96* 83* 87* 89*  ALKPHOS 112 106 112 119  BILITOT 0.9 0.9 1.0 0.9  PROT 6.2* 5.8* 5.7* 5.7*  ALBUMIN 2.9* 2.7* 2.7* 2.6*   No results for input(s): LIPASE, AMYLASE in the last 168 hours. Recent Labs  Lab 02/21/20 0529 02/22/20 0515 02/23/20 0550 02/24/20 0715  AMMONIA 78* 83* 88* 57*   CBC: Recent Labs  Lab 02/20/20 1939 02/21/20 0529 02/24/20 0556  WBC 8.0 7.8 7.1  NEUTROABS 5.6  --   --   HGB 11.2* 10.4* 10.5*  HCT 35.0* 32.0* 32.4*  MCV 93.6 93.3 93.1  PLT 175 187 175   Cardiac Enzymes: No results for input(s): CKTOTAL, CKMB, CKMBINDEX, TROPONINI in the last 168 hours. BNP: Invalid input(s): POCBNP CBG: No results for input(s): GLUCAP in the last 168 hours.  Time coordinating discharge:  36 minutes  Signed:  Orson Eva, DO [facility] Pager: [phone] 02/24/2020, 10:47 AM

## 2020-02-24 NOTE — TOC Transition Note (Signed)
Transition of Care [facility]) - CM/SW Discharge Note   Patient Details  Name: [name] MRN: [id number] Date of Birth: November 22, 1965  Transition of Care [facility]) CM/SW Contact:  Boneta Lucks, RN Phone Number: 02/24/2020, 10:14 AM   Clinical Narrative:   Patient discharging home today. Patient is active with [facility] for RN/PT, new orders placed, [facility] updated.    Final next level of care: [place] Barriers to Discharge: Continued Medical Work up   Patient Goals and CMS Choice Patient states their goals for this hospitalization and ongoing recovery are:: See improvements with her confusion CMS Medicare.gov Compare Post Acute Care list provided to:: Patient Represenative (must comment) [name] (niece))    Discharge Placement    Discharge Plan and Services In-house Referral: Clinical Social Work Discharge Planning Services: NA Post Acute Care Choice: Home Health          DME Arranged: N/A DME Agency: NA    HH Arranged: PT,RN [place]: [place] ([place])     Readmission Risk Interventions Readmission Risk Prevention Plan 02/24/2020 02/14/2020  Medication Screening Complete Complete  Transportation Screening Complete Complete  Some recent data might be hidden

## 2020-02-24 NOTE — Care Management Important Message (Signed)
Important Message  Patient Details  Name: NAJA APPERSON MRN: [id number] Date of Birth: [birth year]   Medicare Important Message Given:  Yes     Tommy Medal 02/24/2020, 12:10 PM

## 2020-02-26 DIAGNOSIS — E7800 Pure hypercholesterolemia, unspecified: Secondary | ICD-10-CM | POA: Diagnosis not present

## 2020-02-26 DIAGNOSIS — G3184 Mild cognitive impairment, so stated: Secondary | ICD-10-CM | POA: Diagnosis not present

## 2020-02-26 DIAGNOSIS — I951 Orthostatic hypotension: Secondary | ICD-10-CM | POA: Diagnosis not present

## 2020-02-26 DIAGNOSIS — J181 Lobar pneumonia, unspecified organism: Secondary | ICD-10-CM | POA: Diagnosis not present

## 2020-02-26 DIAGNOSIS — E7220 Disorder of urea cycle metabolism, unspecified: Secondary | ICD-10-CM | POA: Diagnosis not present

## 2020-02-26 DIAGNOSIS — J440 Chronic obstructive pulmonary disease with acute lower respiratory infection: Secondary | ICD-10-CM | POA: Diagnosis not present

## 2020-02-26 DIAGNOSIS — R69 Illness, unspecified: Secondary | ICD-10-CM | POA: Diagnosis not present

## 2020-02-26 DIAGNOSIS — K760 Fatty (change of) liver, not elsewhere classified: Secondary | ICD-10-CM | POA: Diagnosis not present

## 2020-02-26 DIAGNOSIS — G40201 Localization-related (focal) (partial) symptomatic epilepsy and epileptic syndromes with complex partial seizures, not intractable, with status epilepticus: Secondary | ICD-10-CM | POA: Diagnosis not present

## 2020-02-27 ENCOUNTER — Telehealth: Payer: Self-pay

## 2020-02-27 NOTE — Telephone Encounter (Cosign Needed)
Transition Care Management Follow-up Telephone Call  Date of discharge and from where: 02/24/20 [facility]   How have you been since you were released from the hospital? ok  Any questions or concerns? No  Items Reviewed:  Did the pt receive and understand the discharge instructions provided? Yes   Medications obtained and verified? No   Other? No   Any new allergies since your discharge? No   Dietary orders reviewed? Yes  Do you have support at home? Yes   Home Care and Equipment/Supplies: Were home health services ordered? yes If so, what is the name of the agency? Pt stated she no longer needs PT   Has the agency set up a time to come to the patient's home? no Were any new equipment or medical supplies ordered?  No What is the name of the medical supply agency?  Were you able to get the supplies/equipment? not applicable Do you have any questions related to the use of the equipment or supplies? No  Functional Questionnaire: (I = Independent and D = Dependent) ADLs: I  Bathing/Dressing- I  Meal Prep- I  Eating- I  Maintaining continence- I  Transferring/Ambulation- I  Managing Meds- I  Follow up appointments reviewed:   PCP Hospital f/u appt confirmed? No  Pt mother stated she will call back for appt   [facility] f/u appt confirmed? Yes  Scheduled to see Dr Wylie Hail on  04/09/20  Are transportation arrangements needed? No   If their condition worsens, is the pt aware to call PCP or go to the Emergency Dept.? Yes  Was the patient provided with contact information for the PCP's office or ED? Yes  Was to pt encouraged to call back with questions or concerns? Yes

## 2020-02-28 ENCOUNTER — Telehealth: Payer: Self-pay

## 2020-02-28 ENCOUNTER — Ambulatory Visit: Payer: Medicare HMO | Admitting: Gastroenterology

## 2020-02-28 ENCOUNTER — Encounter: Payer: Self-pay | Admitting: Psychology

## 2020-02-28 NOTE — Telephone Encounter (Signed)
Pt needs to schedule a HFU. Dr. Jonni Sanger does not have a slot open for one in the near future. Please advise how to schedule   [phone]

## 2020-02-29 NOTE — Telephone Encounter (Signed)
Please schedule in same day slot if needed. Patient needs HFU in 1-2 weeks per hospital discharge.

## 2020-02-29 NOTE — Telephone Encounter (Signed)
Called number below. Phone kept ringing. No answer.

## 2020-03-01 DIAGNOSIS — K760 Fatty (change of) liver, not elsewhere classified: Secondary | ICD-10-CM | POA: Diagnosis not present

## 2020-03-01 DIAGNOSIS — G3184 Mild cognitive impairment, so stated: Secondary | ICD-10-CM | POA: Diagnosis not present

## 2020-03-01 DIAGNOSIS — E7800 Pure hypercholesterolemia, unspecified: Secondary | ICD-10-CM | POA: Diagnosis not present

## 2020-03-01 DIAGNOSIS — I951 Orthostatic hypotension: Secondary | ICD-10-CM | POA: Diagnosis not present

## 2020-03-01 DIAGNOSIS — E7220 Disorder of urea cycle metabolism, unspecified: Secondary | ICD-10-CM | POA: Diagnosis not present

## 2020-03-01 DIAGNOSIS — R69 Illness, unspecified: Secondary | ICD-10-CM | POA: Diagnosis not present

## 2020-03-01 DIAGNOSIS — J440 Chronic obstructive pulmonary disease with acute lower respiratory infection: Secondary | ICD-10-CM | POA: Diagnosis not present

## 2020-03-01 DIAGNOSIS — G40201 Localization-related (focal) (partial) symptomatic epilepsy and epileptic syndromes with complex partial seizures, not intractable, with status epilepticus: Secondary | ICD-10-CM | POA: Diagnosis not present

## 2020-03-01 DIAGNOSIS — J181 Lobar pneumonia, unspecified organism: Secondary | ICD-10-CM | POA: Diagnosis not present

## 2020-03-02 DIAGNOSIS — E7800 Pure hypercholesterolemia, unspecified: Secondary | ICD-10-CM | POA: Diagnosis not present

## 2020-03-02 DIAGNOSIS — G40201 Localization-related (focal) (partial) symptomatic epilepsy and epileptic syndromes with complex partial seizures, not intractable, with status epilepticus: Secondary | ICD-10-CM | POA: Diagnosis not present

## 2020-03-02 DIAGNOSIS — R69 Illness, unspecified: Secondary | ICD-10-CM | POA: Diagnosis not present

## 2020-03-02 DIAGNOSIS — E7220 Disorder of urea cycle metabolism, unspecified: Secondary | ICD-10-CM | POA: Diagnosis not present

## 2020-03-02 DIAGNOSIS — I951 Orthostatic hypotension: Secondary | ICD-10-CM | POA: Diagnosis not present

## 2020-03-02 DIAGNOSIS — J440 Chronic obstructive pulmonary disease with acute lower respiratory infection: Secondary | ICD-10-CM | POA: Diagnosis not present

## 2020-03-02 DIAGNOSIS — K760 Fatty (change of) liver, not elsewhere classified: Secondary | ICD-10-CM | POA: Diagnosis not present

## 2020-03-02 DIAGNOSIS — J181 Lobar pneumonia, unspecified organism: Secondary | ICD-10-CM | POA: Diagnosis not present

## 2020-03-02 DIAGNOSIS — G3184 Mild cognitive impairment, so stated: Secondary | ICD-10-CM | POA: Diagnosis not present

## 2020-03-05 DIAGNOSIS — G40201 Localization-related (focal) (partial) symptomatic epilepsy and epileptic syndromes with complex partial seizures, not intractable, with status epilepticus: Secondary | ICD-10-CM | POA: Diagnosis not present

## 2020-03-05 DIAGNOSIS — I951 Orthostatic hypotension: Secondary | ICD-10-CM | POA: Diagnosis not present

## 2020-03-05 DIAGNOSIS — R69 Illness, unspecified: Secondary | ICD-10-CM | POA: Diagnosis not present

## 2020-03-05 DIAGNOSIS — G3184 Mild cognitive impairment, so stated: Secondary | ICD-10-CM | POA: Diagnosis not present

## 2020-03-05 DIAGNOSIS — E7220 Disorder of urea cycle metabolism, unspecified: Secondary | ICD-10-CM | POA: Diagnosis not present

## 2020-03-05 DIAGNOSIS — K760 Fatty (change of) liver, not elsewhere classified: Secondary | ICD-10-CM | POA: Diagnosis not present

## 2020-03-05 DIAGNOSIS — E7800 Pure hypercholesterolemia, unspecified: Secondary | ICD-10-CM | POA: Diagnosis not present

## 2020-03-05 DIAGNOSIS — J181 Lobar pneumonia, unspecified organism: Secondary | ICD-10-CM | POA: Diagnosis not present

## 2020-03-05 DIAGNOSIS — J440 Chronic obstructive pulmonary disease with acute lower respiratory infection: Secondary | ICD-10-CM | POA: Diagnosis not present

## 2020-03-07 ENCOUNTER — Other Ambulatory Visit: Payer: Self-pay

## 2020-03-07 ENCOUNTER — Encounter: Payer: Self-pay | Admitting: Family Medicine

## 2020-03-07 ENCOUNTER — Ambulatory Visit (INDEPENDENT_AMBULATORY_CARE_PROVIDER_SITE_OTHER): Payer: Medicare HMO | Admitting: Family Medicine

## 2020-03-07 VITALS — BP 102/74 | HR 86 | Temp 98.6°F | Resp 15 | Wt 126.0 lb

## 2020-03-07 DIAGNOSIS — F330 Major depressive disorder, recurrent, mild: Secondary | ICD-10-CM

## 2020-03-07 DIAGNOSIS — E7220 Disorder of urea cycle metabolism, unspecified: Secondary | ICD-10-CM | POA: Diagnosis not present

## 2020-03-07 DIAGNOSIS — D649 Anemia, unspecified: Secondary | ICD-10-CM

## 2020-03-07 DIAGNOSIS — G9341 Metabolic encephalopathy: Secondary | ICD-10-CM

## 2020-03-07 DIAGNOSIS — R69 Illness, unspecified: Secondary | ICD-10-CM | POA: Diagnosis not present

## 2020-03-07 DIAGNOSIS — R296 Repeated falls: Secondary | ICD-10-CM | POA: Diagnosis not present

## 2020-03-07 DIAGNOSIS — R7989 Other specified abnormal findings of blood chemistry: Secondary | ICD-10-CM

## 2020-03-07 DIAGNOSIS — D329 Benign neoplasm of meninges, unspecified: Secondary | ICD-10-CM

## 2020-03-07 DIAGNOSIS — K760 Fatty (change of) liver, not elsewhere classified: Secondary | ICD-10-CM | POA: Diagnosis not present

## 2020-03-07 DIAGNOSIS — F445 Conversion disorder with seizures or convulsions: Secondary | ICD-10-CM

## 2020-03-07 LAB — HEPATIC FUNCTION PANEL
ALT: 82 U/L — ABNORMAL HIGH (ref 0–35)
AST: 150 U/L — ABNORMAL HIGH (ref 0–37)
Albumin: 3.5 g/dL (ref 3.5–5.2)
Alkaline Phosphatase: 145 U/L — ABNORMAL HIGH (ref 39–117)
Bilirubin, Direct: 0.2 mg/dL (ref 0.0–0.3)
Total Bilirubin: 0.8 mg/dL (ref 0.2–1.2)
Total Protein: 6.5 g/dL (ref 6.0–8.3)

## 2020-03-07 LAB — BASIC METABOLIC PANEL
BUN: 8 mg/dL (ref 6–23)
CO2: 29 mEq/L (ref 19–32)
Calcium: 8.7 mg/dL (ref 8.4–10.5)
Chloride: 106 mEq/L (ref 96–112)
Creatinine, Ser: 0.63 mg/dL (ref 0.40–1.20)
GFR: 100.47 mL/min (ref >60.00)
Glucose, Bld: 98 mg/dL (ref 70–99)
Potassium: 4.2 mEq/L (ref 3.5–5.1)
Sodium: 141 mEq/L (ref 135–145)

## 2020-03-07 LAB — AMMONIA: Ammonia: 32 umol/L (ref 11–35)

## 2020-03-07 NOTE — Progress Notes (Signed)
Subjective  CC:  Chief Complaint  Patient presents with  . Hospitalization Follow-up    Home health witnessed her fall in her front yard, unable to walk afterwards, sent to ER and was admitted. States that over that last few months, it has started to get hard each day to walk due to weakness and dizziness. Requesting liver enzymes and ammonia levels to be checked today      HPI: [name] is a 55 y.o. female who presents to the office today to address the problems listed above in the chief complaint.  This is a transitional care appointment for hospital follow-up.  Patient was admitted February 7 through February 11 due to multiple falls and altered mental status.  I reviewed multiple hospital records including lab work, multiple imaging studies, admission notes and discharge summaries.  This was a hospitalization that occurred approximately a week after a prior hospitalization for dehydration and falls and pneumonia.  I reviewed those notes as well.  To briefly summarize, patient was found down in her yard.  She was confused.  Work-up revealed acute metabolic encephalopathy which is likely multifactorial although she did have an elevated ammonia level.  Brain imaging was unremarkable.  She was found to be folate deficient.  Her mental status cleared with IV hydration and time.  She was found to have worsening elevated LFTs, and acute hepatitis panel was negative and she was scheduled for follow-up with GI for further evaluation.  CT scan revealed mild hepatic steatosis.  Ammonia levels remained elevated at time of discharge although her mental status is clearing.  She was found to be mildly anemic which is new.  Folate levels were low.  Mood: She reports that she is having waves of depression again over the passing of her husband which was almost 3 years ago.  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  She tends to have neurologic problems when she is stressed or feeling down.  She was set up  for neurocognitive evaluation and follow-up with psychiatry that she has next month.  She is on mood medications.  She is also treated for seizures by neurology.  She reports her appetite is low and she does not eat or drink much.  She was found to be orthostatic while hospitalized.  She has a meningioma which is stable in size by brain imaging.  Oncology does not believe that the tumor is playing a role in her above-noted problems.  Frequent falls and generalized weakness: Unclear if related to psychological stressors.  Neurologic exam was unremarkable.  Brain imaging was unremarkable.  She is working with physical therapy.  She is supposed to be using a Rollator but does not have them with her today.  She denies syncope.  She has no cardiac problems that we are aware of.  There were no arrhythmias noted during her hospitalization.   Assessment  1. Metabolic encephalopathy   2. MDD (major depressive disorder), recurrent episode, mild (HCC)   3. Elevated liver function tests   4. Fatty liver   5. Meningioma ([place])   6. Hyperammonemia ([place])   7. Anemia, normocytic normochromic   8. Frequent falls   9. 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  I am concerned could be related to liver disease.  Instructed to keep her consultation with GI.  She canceled last week's appointment.  Also likely multifactorial.  Anxiety and stressors/mood disorder could be playing a role.  She has follow-up with neurocognitive and psychiatry.  Continue current mood medications as listed in medication list.  Elevated liver test with mild hepatic steatosis noted on CT scan: Needs further evaluation per GI.  We will recheck liver tests and ammonia level today.  She has no asterixis but has 2 beats of clonus bilateral feet.  Meningioma: Stable per oncology.  She has follow-up scheduled.  Anemia with new folate deficiency on supplements.  Check iron levels as well.  Vitamin B12  was normal.  Frequent falls with history of orthostatic hypotension: She gets easily dehydrated because she is not drinking or eating much.  Working with PT.  Recommend using her Rollator at all times.  History of pseudoseizures: Need to monitor this as well.  With her history of active depression. Follow up: 3 months for recheck 05/14/2020  Orders Placed This Encounter  Procedures  . Basic metabolic panel  . Hepatic function panel  . Ammonia  . Iron, TIBC and Ferritin Panel   No orders of the defined types were placed in this encounter.     I reviewed the patients updated PMH, FH, and SocHx.    Patient Active Problem List   Diagnosis Date Noted  . Meningioma ([place]) 09/12/2019    Priority: High  . MDD (major depressive disorder), recurrent episode, mild ([place]) 01/08/2018    Priority: High  . Sleep apnea 06/09/2017    Priority: High  . Mild neurocognitive disorder 02/04/2017    Priority: High  . Primary insomnia 05/19/2016    Priority: High  . Partial symptomatic epilepsy with complex partial seizures, not intractable, with status epilepticus ([place]) 07/24/2015    Priority: High  . 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 Colon polyp 06/05/2017    Priority: Medium  . Elevated liver function tests 06/05/2017    Priority: Medium  . Fatty liver     Priority: Medium  . OA (osteoarthritis) of knee 05/07/2015    Priority: Medium  . Memory loss   . Transaminasemia   . Visual hallucination   . Generalized weakness 02/21/2020  . Fall at home, initial encounter 02/21/2020  . Hepatic encephalopathy ([place]) 02/21/2020  . Acute metabolic encephalopathy 78/29/5621  . Hyperammonemia ([place]) 02/13/2020  . Benign neoplasm of cerebral meninges ([place]) 09/09/2019  . Anxiety 12/30/2018  . Widowed - July 2019 08/07/2017  . Seizure ([place]) 11/14/2016  . CAP (community acquired pneumonia) 11/09/2016  . Lobar pneumonia ([place]) 11/28/2015   Current Meds  Medication Sig  . FLUoxetine  (PROZAC) 40 MG capsule Take 2 capsules (80 mg total) by mouth daily.  . folic acid (FOLVITE) 1 MG tablet Take 1 tablet (1 mg total) by mouth daily.  Marland Kitchen lamoTRIgine (LAMICTAL) 150 MG tablet Take 1 tablet (150 mg total) by mouth 2 (two) times daily. For mood control (Patient taking differently: Take 150 mg by mouth 2 (two) times daily.)  . levETIRAcetam (KEPPRA) 1000 MG tablet Take 1 in the morning and 1 tablet at bedtime. (Patient taking differently: Take 1,000 mg by mouth 2 (two) times daily.)  . LORazepam (ATIVAN) 1 MG tablet Take 1 tablet (1 mg total) by mouth daily as needed for anxiety.    Allergies: Patient is allergic to acetaminophen, benadryl [diphenhydramine hcl], codeine, dilantin [phenytoin sodium extended], melatonin, tramadol, ultram [tramadol hcl], vimpat [lacosamide], mirtazapine, betadine [povidone iodine], latex, penicillins, sulfa antibiotics, tape, and vicodin [hydrocodone-acetaminophen]. Family History: Patient family history includes Bipolar disorder in her son; Cancer in her brother, maternal aunt, and  mother; Depression in her father; Diabetes in her brother and maternal aunt; Drug abuse in her son; Heart attack (age of onset: 27) in her sister. Social History:  Patient  reports that she has never smoked. She has never used smokeless tobacco. She reports that she does not drink alcohol and does not use drugs.  Review of Systems: Constitutional: Negative for fever malaise or anorexia Cardiovascular: negative for chest pain Respiratory: negative for SOB or persistent cough Gastrointestinal: negative for abdominal pain  Objective  Vitals: BP 102/74   Pulse 86   Temp 98.6 F (37 C) (Temporal)   Resp 15   Wt 126 lb (57.2 kg)   SpO2 97%   BMI 23.81 kg/m  General: no acute distress , A&Ox3 Psych: Normal speech, poor insight HEENT: PEERL, conjunctiva normal, no jaundice, neck is supple Cardiovascular:  RRR without murmur or gallop.  Respiratory:  Good breath sounds  bilaterally, CTAB with normal respiratory effort Benign abdomen, no hepatomegaly Skin:  Warm, no rashes Neuro: No asterixis, 2 beat clonus bilateral feet     Commons side effects, risks, benefits, and alternatives for medications and treatment plan prescribed today were discussed, and the patient expressed understanding of the given instructions. Patient is instructed to call or message via MyChart if he/she has any questions or concerns regarding our treatment plan. No barriers to understanding were identified. We discussed Red Flag symptoms and signs in detail. Patient expressed understanding regarding what to do in case of urgent or emergency type symptoms.   Medication list was reconciled, printed and provided to the patient in AVS. Patient instructions and summary information was reviewed with the patient as documented in the AVS. This note was prepared with assistance of Dragon voice recognition software. Occasional wrong-word or sound-a-like substitutions may have occurred due to the inherent limitations of voice recognition software  This visit occurred during the SARS-CoV-2 public health emergency.  Safety protocols were in place, including screening questions prior to the visit, additional usage of staff PPE, and extensive cleaning of exam room while observing appropriate contact time as indicated for disinfecting solutions.

## 2020-03-07 NOTE — Patient Instructions (Addendum)
Please return in 3 months for recheck.   Please keep your appointments with Dr. Norman Clay (psychiatry) on March 14th and Dr. Laurier Nancy (GI) on March 28th. These are very important and must be done to help you to start doing better again.   Always use your walker.   I will release your lab results to you on your MyChart account with further instructions. Please reply with any questions.   Please take your folic acid supplement daily.  Eat and drink well.   If you have any questions or concerns, please don't hesitate to send me a message via MyChart or call the office at [phone]. Thank you for visiting with [name] today! It's our pleasure caring for you.

## 2020-03-08 DIAGNOSIS — R69 Illness, unspecified: Secondary | ICD-10-CM | POA: Diagnosis not present

## 2020-03-08 DIAGNOSIS — E7800 Pure hypercholesterolemia, unspecified: Secondary | ICD-10-CM | POA: Diagnosis not present

## 2020-03-08 DIAGNOSIS — G40201 Localization-related (focal) (partial) symptomatic epilepsy and epileptic syndromes with complex partial seizures, not intractable, with status epilepticus: Secondary | ICD-10-CM | POA: Diagnosis not present

## 2020-03-08 DIAGNOSIS — G3184 Mild cognitive impairment, so stated: Secondary | ICD-10-CM | POA: Diagnosis not present

## 2020-03-08 DIAGNOSIS — K760 Fatty (change of) liver, not elsewhere classified: Secondary | ICD-10-CM | POA: Diagnosis not present

## 2020-03-08 DIAGNOSIS — J440 Chronic obstructive pulmonary disease with acute lower respiratory infection: Secondary | ICD-10-CM | POA: Diagnosis not present

## 2020-03-08 DIAGNOSIS — J181 Lobar pneumonia, unspecified organism: Secondary | ICD-10-CM | POA: Diagnosis not present

## 2020-03-08 DIAGNOSIS — I951 Orthostatic hypotension: Secondary | ICD-10-CM | POA: Diagnosis not present

## 2020-03-08 DIAGNOSIS — E7220 Disorder of urea cycle metabolism, unspecified: Secondary | ICD-10-CM | POA: Diagnosis not present

## 2020-03-08 LAB — IRON,TIBC AND FERRITIN PANEL
%SAT: 15 % (calc) — ABNORMAL LOW (ref 16–45)
Ferritin: 194 ng/mL (ref 16–232)
Iron: 39 ug/dL — ABNORMAL LOW (ref 45–160)
TIBC: 266 mcg/dL (calc) (ref 250–450)

## 2020-03-10 DIAGNOSIS — I951 Orthostatic hypotension: Secondary | ICD-10-CM | POA: Diagnosis not present

## 2020-03-10 DIAGNOSIS — G3184 Mild cognitive impairment, so stated: Secondary | ICD-10-CM | POA: Diagnosis not present

## 2020-03-10 DIAGNOSIS — K760 Fatty (change of) liver, not elsewhere classified: Secondary | ICD-10-CM | POA: Diagnosis not present

## 2020-03-10 DIAGNOSIS — R69 Illness, unspecified: Secondary | ICD-10-CM | POA: Diagnosis not present

## 2020-03-10 DIAGNOSIS — J181 Lobar pneumonia, unspecified organism: Secondary | ICD-10-CM | POA: Diagnosis not present

## 2020-03-10 DIAGNOSIS — G40201 Localization-related (focal) (partial) symptomatic epilepsy and epileptic syndromes with complex partial seizures, not intractable, with status epilepticus: Secondary | ICD-10-CM | POA: Diagnosis not present

## 2020-03-10 DIAGNOSIS — E7800 Pure hypercholesterolemia, unspecified: Secondary | ICD-10-CM | POA: Diagnosis not present

## 2020-03-10 DIAGNOSIS — E7220 Disorder of urea cycle metabolism, unspecified: Secondary | ICD-10-CM | POA: Diagnosis not present

## 2020-03-10 DIAGNOSIS — J440 Chronic obstructive pulmonary disease with acute lower respiratory infection: Secondary | ICD-10-CM | POA: Diagnosis not present

## 2020-03-12 DIAGNOSIS — I951 Orthostatic hypotension: Secondary | ICD-10-CM | POA: Diagnosis not present

## 2020-03-12 DIAGNOSIS — J440 Chronic obstructive pulmonary disease with acute lower respiratory infection: Secondary | ICD-10-CM | POA: Diagnosis not present

## 2020-03-12 DIAGNOSIS — J181 Lobar pneumonia, unspecified organism: Secondary | ICD-10-CM | POA: Diagnosis not present

## 2020-03-12 DIAGNOSIS — G3184 Mild cognitive impairment, so stated: Secondary | ICD-10-CM | POA: Diagnosis not present

## 2020-03-12 DIAGNOSIS — R69 Illness, unspecified: Secondary | ICD-10-CM | POA: Diagnosis not present

## 2020-03-12 DIAGNOSIS — K760 Fatty (change of) liver, not elsewhere classified: Secondary | ICD-10-CM | POA: Diagnosis not present

## 2020-03-12 DIAGNOSIS — E7800 Pure hypercholesterolemia, unspecified: Secondary | ICD-10-CM | POA: Diagnosis not present

## 2020-03-12 DIAGNOSIS — G40201 Localization-related (focal) (partial) symptomatic epilepsy and epileptic syndromes with complex partial seizures, not intractable, with status epilepticus: Secondary | ICD-10-CM | POA: Diagnosis not present

## 2020-03-12 DIAGNOSIS — E7220 Disorder of urea cycle metabolism, unspecified: Secondary | ICD-10-CM | POA: Diagnosis not present

## 2020-03-12 NOTE — Progress Notes (Signed)
Virtual Visit via Telephone Note  I connected with [name] on 03/26/20 at  2:00 PM EDT by telephone and verified that I am speaking with the correct person using two identifiers.  Location: Patient: home Provider: office Persons participated in the visit- patient, provider   I discussed the limitations, risks, security and privacy concerns of performing an evaluation and management service by telephone and the availability of in person appointments. I also discussed with the patient that there may be a patient responsible charge related to this service. The patient expressed understanding and agreed to proceed.   I discussed the assessment and treatment plan with the patient. The patient was provided an opportunity to ask questions and all were answered. The patient agreed with the plan and demonstrated an understanding of the instructions.   The patient was advised to call back or seek an in-person evaluation if the symptoms worsen or if the condition fails to improve as anticipated.  I provided 15 minutes of non-face-to-face time during this encounter.   Norman Clay, MD    [facility] MD/PA/NP OP Progress Note  03/26/2020 2:20 PM [name]  MRN:  [id number]  Chief Complaint:  Chief Complaint    Follow-up; Anxiety; Depression     HPI:  - since the last visit, she was admitted twice; community acquire pneumonia, followed by acute metabolic encephalopathy with hyperammmonia. She was referred to neuropsych evaluation.   This is a follow-up appointment for depression and anxiety.  She states that she has been feeling "whole a lot better."  Her gabapentin was discontinued, and she feels less dizzy since then.  She stayed at her mother's place since being discharged.  She reports good support from her mother.  Her mood has been better compared to before.  She started to have diplopia, and she is currently under evaluation of this.  She has depressive symptoms as in PHQ-9.  She  feels anxious at times.  She denies panic attacks.  She takes lorazepam as needed for anxiety; has not required it every day.   Household:live by herself (currently stays at her mother), and her dog. Mother lives across the street [employer] until 2004, currently on disability Marital status: widowed, after more than 30 years of marriage Children: 2. Her mother, brother lives in neighborhood   125 lbs Wt Readings from Last 3 Encounters:  03/07/20 126 lb (57.2 kg)  02/24/20 131 lb 2.8 oz (59.5 kg)  02/13/20 129 lb 10.1 oz (58.8 kg)      Visit Diagnosis:    ICD-10-CM   1. MDD (major depressive disorder), recurrent episode, mild ([place])  F33.0   2. Anxiety  F41.9 LORazepam (ATIVAN) 1 MG tablet    FLUoxetine (PROZAC) 40 MG capsule    Past Psychiatric History: Please see initial evaluation for full details. I have reviewed the history. No updates at this time.     Past Medical History:  Past Medical History:  Diagnosis Date  . Arthritis    "knees" (04/22/2012)  . Chronic bronchitis ([place])    "yearly; when the weather changes" (04/22/2012)  . Colon polyp   . Colon polyps    adenomatous and hyperplastic-  . Depression   . Eczema   . Epilepsy ([place])    "been having them right often here lately" (04/22/2012)  . Fatty liver   . High cholesterol   . History of kidney stones   . Osteoarthritis    [name] 04/22/2012  . Other convulsions 05/21/12  non-epileptic spells  . Restless leg   . Seizures ([place])   . Vertigo     Past Surgical History:  Procedure Laterality Date  . ABDOMINAL HYSTERECTOMY  2001  . BLADDER SUSPENSION    . BUNIONECTOMY Left 2000  . CESAREAN SECTION  1987; 1988  . COLONOSCOPY    . JOINT REPLACEMENT    . MASS EXCISION  10/22/2011   Procedure: EXCISION MASS;  Surgeon: [name], MD;  Location: [facility];  Service: General;  Laterality: Right;  excision right buttock mass  . TOTAL HIP ARTHROPLASTY Left 1993; 1995; 2000  . TOTAL KNEE  ARTHROPLASTY Left 05/07/2015   Procedure: TOTAL LEFT KNEE ARTHROPLASTY;  Surgeon: [name], MD;  [facility];  Service: Orthopedics;  Laterality: Left;  . TOTAL KNEE ARTHROPLASTY Right 10/01/2015   Procedure: RIGHT TOTAL KNEE ARTHROPLASTY;  Surgeon: [name], MD;  Location: [facility];  Service: Orthopedics;  Laterality: Right;    Family Psychiatric History: Please see initial evaluation for full details. I have reviewed the history. No updates at this time.     Family History:  Family History  Problem Relation Age of Onset  . Cancer Mother   . Depression Father   . Cancer Brother   . Diabetes Brother   . Cancer Maternal Aunt   . Diabetes Maternal Aunt   . Bipolar disorder Son   . Drug abuse Son   . Heart attack Sister 33       during severe illness    Social History:  Social History   Socioeconomic History  . Marital status: Widowed    Spouse name: [name]  . Number of children: 2  . Years of education: 12 +  . Highest education level: Not on file  Occupational History  . Occupation: disability, medical  Tobacco Use  . Smoking status: Never Smoker  . Smokeless tobacco: Never Used  Vaping Use  . Vaping Use: Never used  Substance and Sexual Activity  . Alcohol use: No    Alcohol/week: 0.0 standard drinks    Comment: 12-25-2015 per pt no  . Drug use: No    Comment: 21-12-2015 per pt no   . Sexual activity: Not Currently    Birth control/protection: Surgical  Other Topics Concern  . Not on file  Social History Narrative   Patient has two adult children.   Patient is disabled.   Patient has a high school education and some trade school.   Patient is right-handed.   Patient drinks four glasses of soda and tea daily.   Social Determinants of Health   Financial Resource Strain: Not on file  Food Insecurity: No Food Insecurity  . Worried About Charity fundraiser in the Last Year: Never true  . Ran Out of Food in the Last Year: Never true   Transportation Needs: No Transportation Needs  . Lack of Transportation (Medical): No  . Lack of Transportation (Non-Medical): No  Physical Activity: Not on file  Stress: Not on file  Social Connections: Not on file    Allergies:  Allergies  Allergen Reactions  . Acetaminophen Other (See Comments)    Has fatty deposits on liver  . Benadryl [Diphenhydramine Hcl] Other (See Comments)    hyperactivity and seizures  . Codeine Itching and Rash    seizures  . Dilantin [Phenytoin Sodium Extended] Other (See Comments)    Elevated LFT's  . Melatonin Other (See Comments)    seizures  . Tramadol Other (See Comments)    "  causes seizures"  . Ultram [Tramadol Hcl] Other (See Comments)    Seizures  . Vimpat [Lacosamide] Other (See Comments)    Severe dizziness  . Mirtazapine Other (See Comments)    Potentiated seizure activity  . Betadine [Povidone Iodine] Rash  . Latex Rash  . Penicillins Itching and Rash    Has patient had a PCN reaction causing immediate rash, facial/tongue/throat swelling, SOB or lightheadedness with hypotension: No Has patient had a PCN reaction causing severe rash involving mucus membranes or skin necrosis: No Has patient had a PCN reaction that required hospitalization No Has patient had a PCN reaction occurring within the last 10 years: No If all of the above answers are "NO", then may proceed with Cephalosporin use.  . Sulfa Antibiotics Rash  . Tape Rash and Other (See Comments)    Paper tape please Paper tape please  . Vicodin [Hydrocodone-Acetaminophen] Itching    Metabolic Disorder Labs: Lab Results  Component Value Date   HGBA1C 5.4 11/28/2015   MPG 108 11/28/2015   No results found for: PROLACTIN Lab Results  Component Value Date   CHOL 196 09/12/2019   TRIG 167 (H) 09/12/2019   HDL 44 (L) 09/12/2019   CHOLHDL 4.5 09/12/2019   VLDL 24.8 09/21/2017   LDLCALC 124 (H) 09/12/2019   LDLCALC 73 09/21/2017   Lab Results  Component Value Date    TSH 2.648 02/20/2020   TSH 2.85 09/12/2019    Therapeutic Level Labs: No results found for: LITHIUM No results found for: VALPROATE No components found for:  CBMZ  Current Medications: Current Outpatient Medications  Medication Sig Dispense Refill  . [START ON 04/26/2020] FLUoxetine (PROZAC) 40 MG capsule Take 2 capsules (80 mg total) by mouth daily. 295 capsule 0  . folic acid (FOLVITE) 1 MG tablet Take 1 tablet (1 mg total) by mouth daily.    Marland Kitchen gabapentin (NEURONTIN) 600 MG tablet TAKE 1 TABLET BY MOUTH THREE TIMES DAILY (Patient not taking: Reported on 03/07/2020) 270 tablet 0  . lamoTRIgine (LAMICTAL) 150 MG tablet Take 1 tablet (150 mg total) by mouth 2 (two) times daily. For mood control (Patient taking differently: Take 150 mg by mouth 2 (two) times daily.) 60 tablet 5  . levETIRAcetam (KEPPRA) 1000 MG tablet Take 1 in the morning and 1 tablet at bedtime. (Patient taking differently: Take 1,000 mg by mouth 2 (two) times daily.) 180 tablet 0  . LORazepam (ATIVAN) 1 MG tablet Take 1 tablet (1 mg total) by mouth daily as needed for anxiety. 30 tablet 2   No current facility-administered medications for this visit.     Musculoskeletal: Strength & Muscle Tone: N/A Gait & Station: N/A Patient leans: N/A  Psychiatric Specialty Exam: Review of Systems  Psychiatric/Behavioral: Negative for agitation, behavioral problems, confusion, decreased concentration, dysphoric mood, hallucinations, self-injury, sleep disturbance and suicidal ideas. The patient is nervous/anxious. The patient is not hyperactive.   All other systems reviewed and are negative.   There were no vitals taken for this visit.There is no height or weight on file to calculate BMI.  General Appearance: Fairly Groomed  Eye Contact:  Good  Speech:  Clear and Coherent  Volume:  Normal  Mood:  better  Affect:  NA  Thought Process:  Coherent  Orientation:  Full (Time, Place, and Person)  Thought Content: Logical    Suicidal Thoughts:  No  Homicidal Thoughts:  No  Memory:  Immediate;   Good  Judgement:  Good  Insight:  Good  Psychomotor  Activity:  Normal  Concentration:  Concentration: Good and Attention Span: Good  Recall:  Good  Fund of Knowledge: Good  Language: Good  Akathisia:  No  Handed:  Right  AIMS (if indicated): not done  Assets:  Communication Skills Desire for Improvement  ADL's:  Intact  Cognition: WNL  Sleep:  Fair   Screenings: AIMS   Flowsheet Row ED to Hosp-Admission (Discharged) from 10/30/2017 in [facility]  AIMS Total Score 0    AUDIT   Flowsheet Row ED to Hosp-Admission (Discharged) from 10/30/2017 in [facility]  Alcohol Use Disorder Identification Test Final Score (AUDIT) 0    GAD-7   Flowsheet Row Office Visit from 01/31/2015 in [facility]  Total GAD-7 Score [place] Visit from 02/20/2014 in [facility]  Total Score (max 30 points ) 30    PHQ2-9   Flowsheet Row Video Visit from 03/26/2020 in [facility] Office Visit from 09/12/2019 in [facility] Patient Outreach from 08/31/2017 in [facility] Visit from 04/17/2017 in [facility] Visit from 04/13/2017 in [facility]  PHQ-2 Total Score 2 5 2 1 2   PHQ-9 Total Score 5 20 14  -- 17    Flowsheet Row Video Visit from 03/26/2020 in [facility] ED to Hosp-Admission (Discharged) from 02/20/2020 in [facility] ED to Hosp-Admission (Discharged) from 02/12/2020 in [facility] No Risk No Risk No Risk       Assessment and Plan:  [name] is a 55 y.o. year old female with a history of depression, pseudoseizure,partialsymptomatic epilepsy with complex partial seizures,  osteoarthritis, congenital hip dislocation s/p replacement, who presents for follow up appointment for below.   1. Anxiety 2. MDD (major depressive disorder), recurrent episode, mild ([place]) She reports overall improvement in depressive symptoms and anxiety. Psychosocial stressors includes recent loss of her family, her medical condition of meningioma, and grief of loss of her husband.   Will continue current dose of fluoxetine as maintenance therapy for depression and anxiety.  Will continue lorazepam as needed for anxiety.  Discussed risk of dependence and oversedation.   Plan I have reviewed and updated plans as below 1.Continuefluoxetine 80 mg daily  2.Continue lorazepam1 mg daily as needed for anxiety(she takes this when she has a seizure like episode) 3.. Next appointment:6/13 at 1 PM  for 20 mins, phone  -on  Lamictal, Keppra for seizure. - on pramipexole for restless leg  Past trials of medication: fluoxetine,sertraline (increased appetite),citalopram,mirtazapine (patient self discontinued as she was afraid of ), hydroxyzine (hallucinations),Trazodone, lorazepam, clonazepam   The patient demonstrates the following risk factors for suicide: Chronic risk factors for suicide include: psychiatric disorder of depressionand previous suicide attempts of overdosing medication. Acute risk factorsfor suicide include: unemployment and her husband with terminal illness. Protective factorsfor this patient include: positive social support, coping skills and hope for the future. Considering these factors, the overall suicide risk at this point appears to be low. Patient isappropriate for outpatient follow up.  Norman Clay, MD 03/26/2020, 2:20 PM

## 2020-03-15 DIAGNOSIS — I951 Orthostatic hypotension: Secondary | ICD-10-CM | POA: Diagnosis not present

## 2020-03-15 DIAGNOSIS — K760 Fatty (change of) liver, not elsewhere classified: Secondary | ICD-10-CM | POA: Diagnosis not present

## 2020-03-15 DIAGNOSIS — J181 Lobar pneumonia, unspecified organism: Secondary | ICD-10-CM | POA: Diagnosis not present

## 2020-03-15 DIAGNOSIS — G3184 Mild cognitive impairment, so stated: Secondary | ICD-10-CM | POA: Diagnosis not present

## 2020-03-15 DIAGNOSIS — J440 Chronic obstructive pulmonary disease with acute lower respiratory infection: Secondary | ICD-10-CM | POA: Diagnosis not present

## 2020-03-15 DIAGNOSIS — E7800 Pure hypercholesterolemia, unspecified: Secondary | ICD-10-CM | POA: Diagnosis not present

## 2020-03-15 DIAGNOSIS — G40201 Localization-related (focal) (partial) symptomatic epilepsy and epileptic syndromes with complex partial seizures, not intractable, with status epilepticus: Secondary | ICD-10-CM | POA: Diagnosis not present

## 2020-03-15 DIAGNOSIS — R69 Illness, unspecified: Secondary | ICD-10-CM | POA: Diagnosis not present

## 2020-03-15 DIAGNOSIS — E7220 Disorder of urea cycle metabolism, unspecified: Secondary | ICD-10-CM | POA: Diagnosis not present

## 2020-03-21 ENCOUNTER — Telehealth: Payer: PPO | Admitting: Psychiatry

## 2020-03-26 ENCOUNTER — Telehealth (INDEPENDENT_AMBULATORY_CARE_PROVIDER_SITE_OTHER): Payer: Medicare HMO | Admitting: Psychiatry

## 2020-03-26 ENCOUNTER — Encounter: Payer: Self-pay | Admitting: Psychiatry

## 2020-03-26 ENCOUNTER — Other Ambulatory Visit: Payer: Self-pay

## 2020-03-26 DIAGNOSIS — F330 Major depressive disorder, recurrent, mild: Secondary | ICD-10-CM

## 2020-03-26 DIAGNOSIS — F419 Anxiety disorder, unspecified: Secondary | ICD-10-CM | POA: Diagnosis not present

## 2020-03-26 DIAGNOSIS — R69 Illness, unspecified: Secondary | ICD-10-CM | POA: Diagnosis not present

## 2020-03-26 MED ORDER — FLUOXETINE HCL 40 MG PO CAPS: 80.0000 mg | ORAL_CAPSULE | Freq: Every day | ORAL | 0 refills | Status: AC

## 2020-03-26 MED ORDER — LORAZEPAM 1 MG PO TABS: 1.0000 mg | ORAL_TABLET | Freq: Every day | ORAL | 2 refills | Status: AC | PRN

## 2020-04-09 ENCOUNTER — Encounter: Payer: Self-pay | Admitting: Gastroenterology

## 2020-04-09 ENCOUNTER — Other Ambulatory Visit (INDEPENDENT_AMBULATORY_CARE_PROVIDER_SITE_OTHER): Payer: Medicare HMO

## 2020-04-09 ENCOUNTER — Ambulatory Visit: Payer: Medicare HMO | Admitting: Gastroenterology

## 2020-04-09 VITALS — BP 98/70 | HR 79 | Ht 60.0 in | Wt 128.0 lb

## 2020-04-09 DIAGNOSIS — K760 Fatty (change of) liver, not elsewhere classified: Secondary | ICD-10-CM | POA: Diagnosis not present

## 2020-04-09 DIAGNOSIS — R748 Abnormal levels of other serum enzymes: Secondary | ICD-10-CM | POA: Diagnosis not present

## 2020-04-09 LAB — IRON: Iron: 90 ug/dL (ref 42–145)

## 2020-04-09 LAB — GLUCOSE, RANDOM: Glucose, Bld: 93 mg/dL (ref 70–99)

## 2020-04-09 NOTE — Patient Instructions (Addendum)
If you are age 55 or older, your body mass index should be between 23-30. Your Body mass index is 25 kg/m. If this is out of the aforementioned range listed, please consider follow up with your Primary Care Provider.  If you are age 15 or younger, your body mass index should be between 19-25. Your Body mass index is 25 kg/m. If this is out of the aformentioned range listed, please consider follow up with your Primary Care Provider.   Your provider has requested that you go to the basement level for lab work before leaving today. Press "B" on the elevator. The lab is located at the first door on the left as you exit the elevator.  Due to recent changes in healthcare laws, you may see the results of your imaging and laboratory studies on MyChart before your provider has had a chance to review them.  We understand that in some cases there may be results that are confusing or concerning to you. Not all laboratory results come back in the same time frame and the provider may be waiting for multiple results in order to interpret others.  Please give [name] 48 hours in order for your provider to thoroughly review all the results before contacting the office for clarification of your results.   You will be contacted by [facility] in the next 2 days to arrange a Ultrasound and MRI*.  The number on your caller ID will be [phone], please answer when they call.  If you have not heard from them in 2 days please call [phone] to schedule.     It was a pleasure to see you today!  Dr. Tarri Glenn

## 2020-04-09 NOTE — Progress Notes (Signed)
Referring Provider: [name], MD Primary Care Physician:  [name], MD  Reason for Consultation:  Abnormal liver enzymes   IMPRESSION:  Abnormal transaminases: Fatty liver on imaging. Suspected fatty liver given prior imaging. Platelets count is normal, but low normal, in combination with FIB4 and APRI results raises suspicion for advanced fibrosis. Reviewed the natural history and treatment options. Must exclude concurrent causes of elevated transaminases and stage for liver disease. Will use labs and imaging to avoid liver biopsy.  Treatment of fatty liver is currently focused on working towards a healthy weight, regular exercise, and maximizing control of diabetes and cholesterol abnormalities. Hopefully, medical therapy will be available in the future. There is a known increased risk of cardiovascular disease in the patients with NASH.   History of colon polyps: Due surveillance colonoscopy 2019. She would like to proceed with colonoscopy now.   Chronic constipation: Please with current management with suppositories PRN. Discussed high fiber diet, considering fiber supplements, regular exercise, and drinking at least 64 ounces of water daily.   PLAN: - Hepatitis C antibody, hepatitis B surface antigen, hepatitis B core antibody, fasting insulin, fasting glucose, iron, ANA, AMA, anti-smooth muscle antibody, IgG, IgM - FibroSURE - Ultrasound with Elastography - Surveillance colonoscopy given the personal history of colon polyps - Follow-up in the office in 6-8 weeks to review the lab results   Please see the "Patient Instructions" section for addition details about the plan.  HPI: [name] is a 55 y.o. female referred by Dr. Jonni Sanger for fatty liver and abnormal liver enzymes.  The history is obtained through the patient and review of her electronic health record. She has a history of skull base meningioma, 20 year history of seizures managed by a neurologist with  Keppra and Lamictal, anxiety, depression, kidney stones.. She has previously been followed by Dr. Henrene Pastor. Seen recently by oncology for mild encephalopathy. Liver enzymes and ammonia levels were elevated 08/2009 and they thought hepatic dysfunction might be the cause.  However, she reports a complete resolution in symptoms since she stopped taking gabapentin.   Here today to discuss abnormal liver enzymes. She reports a diagnosis of fatty liver many years ago during the evaluation for abdominal pain. She had previously requested an appointment for evaluation.   No history of jaundice or scleral icterus.  No history of use or experimentation with IV or intranasal street drugs.  No history of autoimmune disease. Maternal grandfather died of alcohol-related cirrhosis.  No other family history of liver disease.  Records reviewed reveal: - CT abd/pelvis with contrast 02/12/20: hepatic steatosis - Abdominal ultrasound 02/23/20: normal - Labs 02/20/20: INR 1.1   - Labs 03/07/20: AST 150, ALT 82, alk phos 145, TB 0.8, iron 39, ferritin 194, platelets 175 - ALT and AST have been elevated since 04/2017 - Alk phos normal until last month - Bilirubin consistently normal - Ammonia level was checked 7 times over the last month. It has been mildly elevated or normal, randing from 24-83.  - Calculated results: APRI 2.3, FIB4 5.11  Endoscopic history: - Colonoscopy 08/11/2007 revealed 3 diminutive colon polyps, 2 of which were adenomatous. Followup in 5 years recommended.   - Colonoscopy 06/23/2012: revealed 2 diminutive colon polyps, 1 tubular adenoma and 1 benign colonic mucosa. Surveillance recommended in 5 years.  - EGD 06/23/2012: gastritis, CLO negative   She reports chronic constipation that she treats PRN with Fleets Enemas. GI ROS is otherwise negative.    Maternal grandfather died of  alcohol-related cirrhosis. No known family history of colon cancer or polyps. No family history of uterine/endometrial  cancer, pancreatic cancer or gastric/stomach cancer.   Past Medical History:  Diagnosis Date  . Arthritis    "knees" (04/22/2012)  . Chronic bronchitis ([place])    "yearly; when the weather changes" (04/22/2012)  . Colon polyp   . Colon polyps    adenomatous and hyperplastic-  . Depression   . Eczema   . Epilepsy ([place])    "been having them right often here lately" (04/22/2012)  . Fatty liver   . High cholesterol   . History of kidney stones   . Osteoarthritis    [name] 04/22/2012  . Other convulsions 05/21/12   non-epileptic spells  . Restless leg   . Seizures ([place])   . Vertigo     Past Surgical History:  Procedure Laterality Date  . ABDOMINAL HYSTERECTOMY  2001  . BLADDER SUSPENSION    . BUNIONECTOMY Left 2000  . CESAREAN SECTION  1987; 1988  . COLONOSCOPY    . JOINT REPLACEMENT    . MASS EXCISION  10/22/2011   Procedure: EXCISION MASS;  Surgeon: [name], MD;  Location: [facility];  Service: General;  Laterality: Right;  excision right buttock mass  . TOTAL HIP ARTHROPLASTY Left 1993; 1995; 2000  . TOTAL KNEE ARTHROPLASTY Left 05/07/2015   Procedure: TOTAL LEFT KNEE ARTHROPLASTY;  Surgeon: [name], MD;  [facility];  Service: Orthopedics;  Laterality: Left;  . TOTAL KNEE ARTHROPLASTY Right 10/01/2015   Procedure: RIGHT TOTAL KNEE ARTHROPLASTY;  Surgeon: [name], MD;  [facility];  Service: Orthopedics;  Laterality: Right;    Current Outpatient Medications  Medication Sig Dispense Refill  . [START ON 04/26/2020] FLUoxetine (PROZAC) 40 MG capsule Take 2 capsules (80 mg total) by mouth daily. 967 capsule 0  . folic acid (FOLVITE) 1 MG tablet Take 1 tablet (1 mg total) by mouth daily.    Marland Kitchen lamoTRIgine (LAMICTAL) 150 MG tablet Take 1 tablet (150 mg total) by mouth 2 (two) times daily. For mood control (Patient taking differently: Take 150 mg by mouth 2 (two) times daily.) 60 tablet 5  . levETIRAcetam (KEPPRA) 1000 MG tablet Take 1 in the morning and 1  tablet at bedtime. (Patient taking differently: Take 1,000 mg by mouth 2 (two) times daily.) 180 tablet 0  . LORazepam (ATIVAN) 1 MG tablet Take 1 tablet (1 mg total) by mouth daily as needed for anxiety. 30 tablet 2   No current facility-administered medications for this visit.    Allergies as of 04/09/2020 - Review Complete 04/09/2020  Allergen Reaction Noted  . Acetaminophen Other (See Comments) 10/23/2014  . Benadryl [diphenhydramine hcl] Other (See Comments) 04/23/2011  . Codeine Itching and Rash   . Dilantin [phenytoin sodium extended] Other (See Comments) 06/22/2011  . Melatonin Other (See Comments) 04/23/2011  . Tramadol Other (See Comments) 09/03/2014  . Ultram [tramadol hcl] Other (See Comments) 07/15/2010  . Vimpat [lacosamide] Other (See Comments) 04/23/2012  . Mirtazapine Other (See Comments) 12/14/2017  . Betadine [povidone iodine] Rash 11/05/2011  . Latex Rash 07/15/2010  . Penicillins Itching and Rash   . Sulfa antibiotics Rash 11/01/2014  . Tape Rash and Other (See Comments) 10/14/2010  . Vicodin [hydrocodone-acetaminophen] Itching 12/26/2010    Family History  Problem Relation Age of Onset  . Cancer Mother   . Depression Father   . Cancer Brother   . Diabetes Brother   . Cancer Maternal Aunt   .  Diabetes Maternal Aunt   . Bipolar disorder Son   . Drug abuse Son   . Heart attack Sister 90       during severe illness     Review of Systems: 12 system ROS is negative except as noted above with the addition of anxiety, arthritis, vision change, confusion, depression, sleeping problems.   Physical Exam: General:   Alert,  well-nourished, pleasant and cooperative in NAD.  Thin. Head:  Normocephalic and atraumatic. Eyes:  Sclera clear, no icterus.   Conjunctiva pink. Ears:  Normal auditory acuity. Nose:  No deformity, discharge,  or lesions. Mouth:  No deformity or lesions.   Neck:  Supple; no masses or thyromegaly. Lungs:  Clear throughout to  auscultation.   No wheezes. Heart:  Regular rate and rhythm; no murmurs. Abdomen:  Soft, nontender, nondistended, normal bowel sounds, no rebound or guarding. No hepatosplenomegaly.  No fluid wave. Rectal:  Deferred  Msk:  Symmetrical. No boney deformities LAD: No inguinal or umbilical LAD Extremities:  No clubbing or edema. Neurologic:  Alert and  oriented x4;  grossly nonfocal Skin:  Intact without significant lesions or rashes except for a few spider angioma on the chest wall.  No palmar erythema.  No Terry's nails. Psych:  Alert and cooperative. Normal mood and affect.    L. Tarri Glenn, MD, MPH 04/09/2020, 12:46 PM

## 2020-04-12 LAB — HEPATITIS C ANTIBODY
Hepatitis C Ab: NONREACTIVE
SIGNAL TO CUT-OFF: 0.02 (ref <1.00)

## 2020-04-12 LAB — IGG: IgG (Immunoglobin G), Serum: 975 mg/dL (ref 600–1640)

## 2020-04-12 LAB — MITOCHONDRIAL ANTIBODIES: Mitochondrial M2 Ab, IgG: <=20 U

## 2020-04-12 LAB — ANTI-NUCLEAR AB-TITER (ANA TITER): ANA Titer 1: 1:80 {titer} — ABNORMAL HIGH

## 2020-04-12 LAB — HEPATITIS B CORE ANTIBODY, TOTAL: Hep B Core Total Ab: NONREACTIVE

## 2020-04-12 LAB — ANA: Anti Nuclear Antibody (ANA): POSITIVE — AB

## 2020-04-12 LAB — ANTI-SMOOTH MUSCLE ANTIBODY, IGG: Actin (Smooth Muscle) Antibody (IGG): <20 U (ref <20)

## 2020-04-12 LAB — HEPATITIS B SURFACE ANTIBODY,QUALITATIVE: Hep B S Ab: NONREACTIVE

## 2020-04-12 LAB — IGM: IgM, Serum: 53 mg/dL (ref 50–300)

## 2020-04-18 ENCOUNTER — Telehealth: Payer: Self-pay | Admitting: Gastroenterology

## 2020-04-18 LAB — INSULIN, FREE AND TOTAL
Free Insulin: 5.8 uU/mL
Total Insulin: 5.8 uU/mL

## 2020-04-18 NOTE — Telephone Encounter (Signed)
Is the cramping related to constipation? Otherwise, this is a new symptom from what we discussed during her last office visit. Thanks.

## 2020-04-18 NOTE — Telephone Encounter (Signed)
See result note. Pt states the pain she is having may be related to constipation. Reports she will try miralax 1-3 doses daily and let us know if this does not help.

## 2020-04-18 NOTE — Telephone Encounter (Signed)
Patient calling to get her lab results also said she needs something to help with her stomach.

## 2020-04-18 NOTE — Telephone Encounter (Signed)
Pt calling for her lab results. Also states she is having abdominal discomfort and is requesting something for her stomach. Reports she has cramping and bloating in the evenings. Describes the pain as a cramping pain. Please advise.

## 2020-04-19 ENCOUNTER — Encounter: Payer: Medicare HMO | Admitting: Psychology

## 2020-04-30 ENCOUNTER — Encounter: Payer: Medicare HMO | Admitting: Psychology

## 2020-05-03 ENCOUNTER — Ambulatory Visit (HOSPITAL_COMMUNITY)
Admission: RE | Admit: 2020-05-03 | Discharge: 2020-05-03 | Disposition: A | Discharge status: Home / Self Care | Payer: Medicare HMO | Source: Ambulatory Visit | Attending: Gastroenterology | Admitting: Gastroenterology

## 2020-05-03 ENCOUNTER — Other Ambulatory Visit: Payer: Self-pay

## 2020-05-03 DIAGNOSIS — K7460 Unspecified cirrhosis of liver: Secondary | ICD-10-CM | POA: Diagnosis not present

## 2020-05-03 DIAGNOSIS — K760 Fatty (change of) liver, not elsewhere classified: Secondary | ICD-10-CM | POA: Insufficient documentation

## 2020-05-03 DIAGNOSIS — R748 Abnormal levels of other serum enzymes: Secondary | ICD-10-CM | POA: Insufficient documentation

## 2020-05-06 ENCOUNTER — Other Ambulatory Visit: Payer: Self-pay | Admitting: Internal Medicine

## 2020-05-06 DIAGNOSIS — F445 Conversion disorder with seizures or convulsions: Secondary | ICD-10-CM

## 2020-05-07 NOTE — Telephone Encounter (Signed)
Rx refill request

## 2020-05-09 ENCOUNTER — Other Ambulatory Visit: Payer: Self-pay | Admitting: Internal Medicine

## 2020-05-09 DIAGNOSIS — D329 Benign neoplasm of meninges, unspecified: Secondary | ICD-10-CM | POA: Diagnosis not present

## 2020-05-09 DIAGNOSIS — H532 Diplopia: Secondary | ICD-10-CM | POA: Diagnosis not present

## 2020-05-09 DIAGNOSIS — F445 Conversion disorder with seizures or convulsions: Secondary | ICD-10-CM

## 2020-05-10 NOTE — Telephone Encounter (Signed)
Rx Request 

## 2020-05-12 ENCOUNTER — Encounter (HOSPITAL_COMMUNITY): Payer: Self-pay | Admitting: Emergency Medicine

## 2020-05-12 ENCOUNTER — Emergency Department (HOSPITAL_COMMUNITY): Payer: Medicare HMO

## 2020-05-12 ENCOUNTER — Other Ambulatory Visit: Payer: Self-pay

## 2020-05-12 ENCOUNTER — Inpatient Hospital Stay (HOSPITAL_COMMUNITY)
Admission: EM | Admit: 2020-05-12 | Discharge: 2020-05-27 | DRG: 871 | Disposition: A | Discharge status: Skilled Nursing Facility | Payer: Medicare HMO | Attending: Internal Medicine | Admitting: Internal Medicine

## 2020-05-12 DIAGNOSIS — W19XXXA Unspecified fall, initial encounter: Secondary | ICD-10-CM

## 2020-05-12 DIAGNOSIS — S2231XA Fracture of one rib, right side, initial encounter for closed fracture: Secondary | ICD-10-CM | POA: Diagnosis not present

## 2020-05-12 DIAGNOSIS — E870 Hyperosmolality and hypernatremia: Secondary | ICD-10-CM | POA: Diagnosis present

## 2020-05-12 DIAGNOSIS — K769 Liver disease, unspecified: Secondary | ICD-10-CM

## 2020-05-12 DIAGNOSIS — J9811 Atelectasis: Secondary | ICD-10-CM | POA: Diagnosis not present

## 2020-05-12 DIAGNOSIS — R7989 Other specified abnormal findings of blood chemistry: Secondary | ICD-10-CM | POA: Diagnosis present

## 2020-05-12 DIAGNOSIS — K760 Fatty (change of) liver, not elsewhere classified: Secondary | ICD-10-CM | POA: Diagnosis present

## 2020-05-12 DIAGNOSIS — R296 Repeated falls: Secondary | ICD-10-CM | POA: Diagnosis present

## 2020-05-12 DIAGNOSIS — I6523 Occlusion and stenosis of bilateral carotid arteries: Secondary | ICD-10-CM | POA: Diagnosis not present

## 2020-05-12 DIAGNOSIS — R110 Nausea: Secondary | ICD-10-CM | POA: Diagnosis not present

## 2020-05-12 DIAGNOSIS — R1011 Right upper quadrant pain: Secondary | ICD-10-CM | POA: Diagnosis not present

## 2020-05-12 DIAGNOSIS — E872 Acidosis: Secondary | ICD-10-CM | POA: Diagnosis present

## 2020-05-12 DIAGNOSIS — R509 Fever, unspecified: Secondary | ICD-10-CM | POA: Diagnosis not present

## 2020-05-12 DIAGNOSIS — R1111 Vomiting without nausea: Secondary | ICD-10-CM | POA: Diagnosis not present

## 2020-05-12 DIAGNOSIS — R0682 Tachypnea, not elsewhere classified: Secondary | ICD-10-CM

## 2020-05-12 DIAGNOSIS — E871 Hypo-osmolality and hyponatremia: Secondary | ICD-10-CM | POA: Diagnosis present

## 2020-05-12 DIAGNOSIS — Z743 Need for continuous supervision: Secondary | ICD-10-CM | POA: Diagnosis not present

## 2020-05-12 DIAGNOSIS — R6510 Systemic inflammatory response syndrome (SIRS) of non-infectious origin without acute organ dysfunction: Secondary | ICD-10-CM | POA: Diagnosis present

## 2020-05-12 DIAGNOSIS — E860 Dehydration: Secondary | ICD-10-CM | POA: Diagnosis present

## 2020-05-12 DIAGNOSIS — N83202 Unspecified ovarian cyst, left side: Secondary | ICD-10-CM | POA: Diagnosis not present

## 2020-05-12 DIAGNOSIS — Z043 Encounter for examination and observation following other accident: Secondary | ICD-10-CM | POA: Diagnosis not present

## 2020-05-12 DIAGNOSIS — A4101 Sepsis due to Methicillin susceptible Staphylococcus aureus: Secondary | ICD-10-CM | POA: Diagnosis not present

## 2020-05-12 DIAGNOSIS — G40909 Epilepsy, unspecified, not intractable, without status epilepticus: Secondary | ICD-10-CM

## 2020-05-12 DIAGNOSIS — R188 Other ascites: Secondary | ICD-10-CM

## 2020-05-12 DIAGNOSIS — Z20822 Contact with and (suspected) exposure to covid-19: Secondary | ICD-10-CM | POA: Diagnosis present

## 2020-05-12 DIAGNOSIS — S299XXA Unspecified injury of thorax, initial encounter: Secondary | ICD-10-CM | POA: Diagnosis present

## 2020-05-12 DIAGNOSIS — I864 Gastric varices: Secondary | ICD-10-CM | POA: Diagnosis present

## 2020-05-12 DIAGNOSIS — Z90710 Acquired absence of both cervix and uterus: Secondary | ICD-10-CM

## 2020-05-12 DIAGNOSIS — J811 Chronic pulmonary edema: Secondary | ICD-10-CM

## 2020-05-12 DIAGNOSIS — E876 Hypokalemia: Secondary | ICD-10-CM | POA: Diagnosis present

## 2020-05-12 DIAGNOSIS — Z96653 Presence of artificial knee joint, bilateral: Secondary | ICD-10-CM | POA: Diagnosis present

## 2020-05-12 DIAGNOSIS — Z86011 Personal history of benign neoplasm of the brain: Secondary | ICD-10-CM

## 2020-05-12 DIAGNOSIS — R5381 Other malaise: Secondary | ICD-10-CM | POA: Diagnosis present

## 2020-05-12 DIAGNOSIS — G9341 Metabolic encephalopathy: Secondary | ICD-10-CM | POA: Diagnosis present

## 2020-05-12 DIAGNOSIS — L8990 Pressure ulcer of unspecified site, unspecified stage: Secondary | ICD-10-CM | POA: Insufficient documentation

## 2020-05-12 DIAGNOSIS — R6520 Severe sepsis without septic shock: Secondary | ICD-10-CM

## 2020-05-12 DIAGNOSIS — W010XXA Fall on same level from slipping, tripping and stumbling without subsequent striking against object, initial encounter: Secondary | ICD-10-CM | POA: Diagnosis present

## 2020-05-12 DIAGNOSIS — D638 Anemia in other chronic diseases classified elsewhere: Secondary | ICD-10-CM | POA: Diagnosis present

## 2020-05-12 DIAGNOSIS — K7290 Hepatic failure, unspecified without coma: Secondary | ICD-10-CM | POA: Diagnosis present

## 2020-05-12 DIAGNOSIS — E7800 Pure hypercholesterolemia, unspecified: Secondary | ICD-10-CM | POA: Diagnosis present

## 2020-05-12 DIAGNOSIS — D329 Benign neoplasm of meninges, unspecified: Secondary | ICD-10-CM | POA: Diagnosis present

## 2020-05-12 DIAGNOSIS — N200 Calculus of kidney: Secondary | ICD-10-CM | POA: Diagnosis present

## 2020-05-12 DIAGNOSIS — R0600 Dyspnea, unspecified: Secondary | ICD-10-CM

## 2020-05-12 DIAGNOSIS — R7401 Elevation of levels of liver transaminase levels: Secondary | ICD-10-CM | POA: Diagnosis present

## 2020-05-12 DIAGNOSIS — Y92008 Other place in unspecified non-institutional (private) residence as the place of occurrence of the external cause: Secondary | ICD-10-CM

## 2020-05-12 DIAGNOSIS — J9601 Acute respiratory failure with hypoxia: Secondary | ICD-10-CM

## 2020-05-12 DIAGNOSIS — K7469 Other cirrhosis of liver: Secondary | ICD-10-CM | POA: Diagnosis present

## 2020-05-12 DIAGNOSIS — Y92009 Unspecified place in unspecified non-institutional (private) residence as the place of occurrence of the external cause: Secondary | ICD-10-CM

## 2020-05-12 DIAGNOSIS — F32A Depression, unspecified: Secondary | ICD-10-CM | POA: Diagnosis present

## 2020-05-12 DIAGNOSIS — K5900 Constipation, unspecified: Secondary | ICD-10-CM | POA: Diagnosis present

## 2020-05-12 DIAGNOSIS — G8929 Other chronic pain: Secondary | ICD-10-CM | POA: Diagnosis present

## 2020-05-12 DIAGNOSIS — N390 Urinary tract infection, site not specified: Secondary | ICD-10-CM | POA: Diagnosis present

## 2020-05-12 DIAGNOSIS — R0781 Pleurodynia: Secondary | ICD-10-CM | POA: Diagnosis not present

## 2020-05-12 DIAGNOSIS — N1330 Unspecified hydronephrosis: Secondary | ICD-10-CM | POA: Diagnosis not present

## 2020-05-12 DIAGNOSIS — E785 Hyperlipidemia, unspecified: Secondary | ICD-10-CM | POA: Diagnosis present

## 2020-05-12 DIAGNOSIS — Z79899 Other long term (current) drug therapy: Secondary | ICD-10-CM

## 2020-05-12 DIAGNOSIS — R0902 Hypoxemia: Secondary | ICD-10-CM

## 2020-05-12 DIAGNOSIS — G2581 Restless legs syndrome: Secondary | ICD-10-CM | POA: Diagnosis present

## 2020-05-12 DIAGNOSIS — G40209 Localization-related (focal) (partial) symptomatic epilepsy and epileptic syndromes with complex partial seizures, not intractable, without status epilepticus: Secondary | ICD-10-CM | POA: Diagnosis present

## 2020-05-12 DIAGNOSIS — K7460 Unspecified cirrhosis of liver: Secondary | ICD-10-CM | POA: Diagnosis present

## 2020-05-12 DIAGNOSIS — L89151 Pressure ulcer of sacral region, stage 1: Secondary | ICD-10-CM | POA: Diagnosis present

## 2020-05-12 DIAGNOSIS — R059 Cough, unspecified: Secondary | ICD-10-CM

## 2020-05-12 DIAGNOSIS — J449 Chronic obstructive pulmonary disease, unspecified: Secondary | ICD-10-CM | POA: Diagnosis present

## 2020-05-12 DIAGNOSIS — K7682 Hepatic encephalopathy: Secondary | ICD-10-CM | POA: Diagnosis present

## 2020-05-12 HISTORY — DX: Benign neoplasm of brain, unspecified: D33.2

## 2020-05-12 LAB — COMPREHENSIVE METABOLIC PANEL
ALT: 66 U/L — ABNORMAL HIGH (ref 0–44)
AST: 138 U/L — ABNORMAL HIGH (ref 15–41)
Albumin: 2.9 g/dL — ABNORMAL LOW (ref 3.5–5.0)
Alkaline Phosphatase: 165 U/L — ABNORMAL HIGH (ref 38–126)
Anion gap: 9 (ref 5–15)
BUN: 10 mg/dL (ref 6–20)
CO2: 21 mmol/L — ABNORMAL LOW (ref 22–32)
Calcium: 8.6 mg/dL — ABNORMAL LOW (ref 8.9–10.3)
Chloride: 104 mmol/L (ref 98–111)
Creatinine, Ser: 0.62 mg/dL (ref 0.44–1.00)
GFR, Estimated: >60 mL/min (ref >60)
Glucose, Bld: 109 mg/dL — ABNORMAL HIGH (ref 70–99)
Potassium: 3.7 mmol/L (ref 3.5–5.1)
Sodium: 134 mmol/L — ABNORMAL LOW (ref 135–145)
Total Bilirubin: 1.9 mg/dL — ABNORMAL HIGH (ref 0.3–1.2)
Total Protein: 6 g/dL — ABNORMAL LOW (ref 6.5–8.1)

## 2020-05-12 LAB — CBC
HCT: 30.3 % — ABNORMAL LOW (ref 36.0–46.0)
Hemoglobin: 10.4 g/dL — ABNORMAL LOW (ref 12.0–15.0)
MCH: 31.2 pg (ref 26.0–34.0)
MCHC: 34.3 g/dL (ref 30.0–36.0)
MCV: 91 fL (ref 80.0–100.0)
Platelets: 214 10*3/uL (ref 150–400)
RBC: 3.33 MIL/uL — ABNORMAL LOW (ref 3.87–5.11)
RDW: 16.8 % — ABNORMAL HIGH (ref 11.5–15.5)
WBC: 12.4 10*3/uL — ABNORMAL HIGH (ref 4.0–10.5)
nRBC: 0 % (ref 0.0–0.2)

## 2020-05-12 LAB — LIPASE, BLOOD: Lipase: 37 U/L (ref 11–51)

## 2020-05-12 LAB — RESP PANEL BY RT-PCR (FLU A&B, COVID) ARPGX2
Influenza A by PCR: NEGATIVE
Influenza B by PCR: NEGATIVE
SARS Coronavirus 2 by RT PCR: NEGATIVE

## 2020-05-12 LAB — LACTIC ACID, PLASMA
Lactic Acid, Venous: 1.8 mmol/L (ref 0.5–1.9)
Lactic Acid, Venous: 2.2 mmol/L (ref 0.5–1.9)

## 2020-05-12 MED ORDER — IOHEXOL 300 MG/ML  SOLN
100.0000 mL | Freq: Once | INTRAMUSCULAR | Status: AC | PRN
  Administered 2020-05-12: 100 mL via INTRAVENOUS

## 2020-05-12 MED ORDER — VANCOMYCIN HCL IN DEXTROSE 1-5 GM/200ML-% IV SOLN
1000.0000 mg | Freq: Once | INTRAVENOUS | Status: AC
  Administered 2020-05-13: 1000 mg via INTRAVENOUS
  Filled 2020-05-12: qty 200

## 2020-05-12 MED ORDER — ACETAMINOPHEN 500 MG PO TABS
1000.0000 mg | ORAL_TABLET | Freq: Once | ORAL | Status: AC
  Administered 2020-05-12: 1000 mg via ORAL
  Filled 2020-05-12: qty 2

## 2020-05-12 MED ORDER — METRONIDAZOLE 500 MG/100ML IV SOLN
500.0000 mg | Freq: Once | INTRAVENOUS | Status: AC
  Administered 2020-05-13: 500 mg via INTRAVENOUS
  Filled 2020-05-12: qty 100

## 2020-05-12 MED ORDER — SODIUM CHLORIDE 0.9 % IV SOLN: INTRAVENOUS | Status: AC

## 2020-05-12 MED ORDER — SODIUM CHLORIDE 0.9 % IV SOLN: 2.0000 g | Freq: Once | INTRAVENOUS | Status: DC

## 2020-05-12 MED ORDER — SODIUM CHLORIDE 0.9 % IV SOLN
2.0000 g | Freq: Once | INTRAVENOUS | Status: AC
  Administered 2020-05-13: 2 g via INTRAVENOUS
  Filled 2020-05-12: qty 2

## 2020-05-12 MED ORDER — SODIUM CHLORIDE 0.9 % IV BOLUS
1000.0000 mL | Freq: Once | INTRAVENOUS | Status: AC
  Administered 2020-05-12: 1000 mL via INTRAVENOUS

## 2020-05-12 NOTE — ED Notes (Signed)
Date and time results received: 05/12/20 2302  Test: Lactic Acid Critical Value: 2.2  Name of Provider Notified: Dr. Laverta Baltimore

## 2020-05-12 NOTE — ED Provider Notes (Signed)
  Provider Note MRN:  [id number]  Arrival date & time: 05/13/20    ED Course and Medical Decision Making  Assumed care from Dr. Laverta Baltimore at shift change.  Fall, fever, vomiting, right upper quadrant pain.  Concern for underlying sepsis.  Fever to 103 with EMS.  Providing empiric antibiotics, CT imaging pending.  CT is without acute process, some small volume ascites.  Admitted to hospital service for further care.  .Critical Care Performed by: Maudie Flakes, MD Authorized by: Maudie Flakes, MD   Critical care provider statement:    Critical care time (minutes):  32   Critical care was necessary to treat or prevent imminent or life-threatening deterioration of the following conditions:  Sepsis   Critical care was time spent personally by me on the following activities:  Discussions with consultants, evaluation of patient's response to treatment, examination of patient, ordering and performing treatments and interventions, ordering and review of laboratory studies, ordering and review of radiographic studies, pulse oximetry, re-evaluation of patient's condition, obtaining history from patient or surrogate and review of old charts    Final Clinical Impressions(s) / ED Diagnoses     ICD-10-CM   1. Right upper quadrant abdominal pain  R10.11   2. Liver disease  K76.9 US Abdomen Limited    US Abdomen Limited    ED Discharge Orders    None      Discharge Instructions   None     Barth Kirks. Sedonia Small, [place] [email or web address]    Maudie Flakes, MD 05/13/20 0700

## 2020-05-12 NOTE — ED Triage Notes (Signed)
Pt fell @ home 2 days ago. C/O pain R rib area and L leg. Per EMS, pt with nausea and vomiting x 1 day. Pt given 4mg  Zofran en route. Pt also given 1mg  Ativan by niece prior to EMS arrival.

## 2020-05-12 NOTE — ED Provider Notes (Signed)
Emergency Department Provider Note   I have reviewed the triage vital signs and the nursing notes.   HISTORY  Chief Complaint Emesis   HPI [name] is a 55 y.o. female with past medical history reviewed below including epilepsy, fatty liver followed by [facility] with concern for possible NASH presents to the emergency department by EMS initially called out with fall 2 days ago.  Patient ambulates with a walker at baseline and lives at home with her daughter.  She states she lost her balance and fell and has been having pain in the right lower chest/right upper quadrant since that time.  Symptoms been gradually worsening.  She not feeling short of breath or having cough.  No known fever but EMS report that on their initial presentation she had a temp of 103 F and was tachycardic.  No antipyretic given in route.  Patient had an episode of vomiting and was given Zofran.  She denies diarrhea.    Past Medical History:  Diagnosis Date  . Arthritis    "knees" (04/22/2012)  . Brain tumor (benign) ([place])   . Chronic bronchitis ([place])    "yearly; when the weather changes" (04/22/2012)  . Colon polyp   . Colon polyps    adenomatous and hyperplastic-  . Depression   . Eczema   . Epilepsy ([place])    "been having them right often here lately" (04/22/2012)  . Fatty liver   . High cholesterol   . History of kidney stones   . Osteoarthritis    [name] 04/22/2012  . Other convulsions 05/21/12   non-epileptic spells  . Restless leg   . Seizures ([place])   . Vertigo     Patient Active Problem List   Diagnosis Date Noted  . Memory loss   . Transaminasemia   . Visual hallucination   . Generalized weakness 02/21/2020  . Fall at home, initial encounter 02/21/2020  . Hepatic encephalopathy ([place]) 02/21/2020  . Acute metabolic encephalopathy 37/16/9678  . Hyperammonemia ([place]) 02/13/2020  . Meningioma ([place]) 09/12/2019  . Benign neoplasm of cerebral meninges ([place]) 09/09/2019  . Anxiety  12/30/2018  . 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 Widowed - July 2019 08/07/2017  . Sleep apnea 06/09/2017  . Colon polyp 06/05/2017  . Elevated liver function tests 06/05/2017  . Fatty liver   . Mild neurocognitive disorder 02/04/2017  . Seizure ([place]) 11/14/2016  . CAP (community acquired pneumonia) 11/09/2016  . 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 Lobar pneumonia ([place]) 11/28/2015  . Partial symptomatic epilepsy with complex partial seizures, not intractable, with status epilepticus ([place]) 07/24/2015  . OA (osteoarthritis) of knee 05/07/2015  . 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 ABDOMINAL HYSTERECTOMY  2001  . BLADDER SUSPENSION    . BUNIONECTOMY Left 2000  . CESAREAN SECTION  1987; 1988  . COLONOSCOPY    . JOINT REPLACEMENT    . MASS EXCISION  10/22/2011   Procedure: EXCISION MASS;  Surgeon: [name], MD;  Location: [facility];  Service: General;  Laterality: Right;  excision right buttock mass  . TOTAL HIP ARTHROPLASTY Left 1993; 1995; 2000  . TOTAL KNEE ARTHROPLASTY Left 05/07/2015   Procedure: TOTAL LEFT KNEE ARTHROPLASTY;  Surgeon: [name], MD;  Location: [facility];  Service: Orthopedics;  Laterality: Left;  . TOTAL KNEE ARTHROPLASTY Right 10/01/2015   Procedure: RIGHT TOTAL KNEE ARTHROPLASTY;  Surgeon: [name], MD;  Location: [facility];  Service: Orthopedics;  Laterality: Right;    Allergies Acetaminophen, Benadryl [diphenhydramine hcl], Codeine, Dilantin [phenytoin sodium extended], Melatonin, Tramadol, Ultram [tramadol hcl], Vimpat [lacosamide], Mirtazapine, Betadine [povidone iodine], Latex, Penicillins, Sulfa antibiotics, Tape, and Vicodin [hydrocodone-acetaminophen]  Family History  Problem Relation Age of Onset  . Cancer Mother   . Depression Father   . Cancer Brother   . Diabetes Brother   . Cancer Maternal Aunt   . Diabetes Maternal Aunt   . Bipolar disorder Son    . Drug abuse Son   . Heart attack Sister 66       during severe illness    Social History Social History   Tobacco Use  . Smoking status: Never Smoker  . Smokeless tobacco: Never Used  Vaping Use  . Vaping Use: Never used  Substance Use Topics  . Alcohol use: No    Alcohol/week: 0.0 standard drinks    Comment: 12-25-2015 per pt no  . Drug use: No    Comment: 21-12-2015 per pt no     Review of Systems  Constitutional: Positive fever/chills Eyes: No visual changes. ENT: No sore throat. Cardiovascular: Denies chest pain. Respiratory: Denies shortness of breath. Gastrointestinal: Positive RUQ abdominal pain.  No nausea, no vomiting.  No diarrhea.  No constipation. Genitourinary: Negative for dysuria. Musculoskeletal: Negative for back pain. Skin: Negative for rash. Neurological: Negative for headaches, focal weakness or numbness.  10-point ROS otherwise negative.  ____________________________________________   PHYSICAL EXAM:  VITAL SIGNS: ED Triage Vitals  Enc Vitals Group     BP 05/12/20 2223 120/77     Pulse Rate 05/12/20 2223 (!) 111     Resp 05/12/20 2223 19     Temp 05/12/20 2219 100.3 F (37.9 C)     Temp Source 05/12/20 2219 Oral     SpO2 05/12/20 2223 93 %     Weight 05/12/20 2220 127 lb 13.9 oz (58 kg)     Height 05/12/20 2220 5' (1.524 m)   Constitutional: Alert and oriented. Well appearing and in no acute distress. Eyes: Conjunctivae are normal.  Head: Atraumatic. Nose: No congestion/rhinnorhea. Mouth/Throat: Mucous membranes are moist. Neck: No stridor.  Cardiovascular: Tachycardia. Good peripheral circulation. Grossly normal heart sounds.   Respiratory: Normal respiratory effort.  No retractions. Lungs CTAB. Gastrointestinal: Soft with focal RUQ tenderness and voluntary guarding. No distention.  Musculoskeletal: No lower extremity tenderness nor edema. No gross deformities of extremities. Neurologic:  Normal speech and language. No gross  focal neurologic deficits are appreciated.  Skin:  Skin is warm, dry and intact. No rash noted.  ____________________________________________   LABS (all labs ordered are listed, but only abnormal results are displayed)  Labs Reviewed  COMPREHENSIVE METABOLIC PANEL - Abnormal; Notable for the following components:      Result Value   Sodium 134 (*)    CO2 21 (*)    Glucose, Bld 109 (*)    Calcium 8.6 (*)    Total Protein 6.0 (*)    Albumin 2.9 (*)    AST 138 (*)    ALT 66 (*)    Alkaline Phosphatase 165 (*)    Total Bilirubin 1.9 (*)    All other components within normal limits  LACTIC ACID, PLASMA - Abnormal; Notable for the following components:   Lactic Acid, Venous 2.2 (*)    All other components within normal limits  CULTURE, BLOOD (ROUTINE X 2)  CULTURE, BLOOD (ROUTINE X 2)  RESP PANEL BY RT-PCR (FLU A&B, COVID) ARPGX2  LIPASE, BLOOD  CBC  URINALYSIS, ROUTINE W REFLEX MICROSCOPIC  LACTIC ACID, PLASMA   ____________________________________________  EKG   EKG Interpretation  Date/Time:  Saturday May 12 2020 22:26:52 EDT Ventricular Rate:  108 PR Interval:  115 QRS Duration: 102 QT Interval:  365 QTC Calculation: 490 R Axis:   56 Text Interpretation: Sinus tachycardia Ventricular premature complex Borderline T abnormalities, anterior leads Borderline prolonged QT interval Baseline wander in lead(s) V2 V5 Confirmed by Gerlene Fee [phone]) on 05/12/2020 10:59:26 PM       ____________________________________________  RADIOLOGY  CXR reviewed.   CT abdomen pending.  ____________________________________________   PROCEDURES  Procedure(s) performed:   Procedures  CRITICAL CARE Performed by: Margette Fast Total critical care time: 35 minutes Critical care time was exclusive of separately billable procedures and treating other patients. Critical care was necessary to treat or prevent imminent or life-threatening deterioration. Critical care was time  spent personally by me on the following activities: development of treatment plan with patient and/or surrogate as well as nursing, discussions with consultants, evaluation of patient's response to treatment, examination of patient, obtaining history from patient or surrogate, ordering and performing treatments and interventions, ordering and review of laboratory studies, ordering and review of radiographic studies, pulse oximetry and re-evaluation of patient's condition.  Nanda Quinton, MD Emergency Medicine  ____________________________________________   INITIAL IMPRESSION / ASSESSMENT AND PLAN / ED COURSE  Pertinent labs & imaging results that were available during my care of the patient were reviewed by me and considered in my medical decision making (see chart for details).   Presents to the emergency department with pain reportedly after a fall 2 days ago but arrives febrile and tachycardic. Positive SIRS vitals with more focal right upper quadrant abdominal pain.  She is being evaluated as an outpatient with our GI for NASH.  Initial lactate is elevated to 2.2.  CBC is pending.  LFTs mildly elevated including bilirubin but close to baseline.  Will obtain chest x-ray to look for pneumonia or possible rib fracture but no focal tenderness over the ribs.  Plan for CT abdomen pelvis to evaluate for possible cholangitis/acute cholecystitis to explain symptoms and fever.  With elevated lactate we will start broad-spectrum antibiotics and IV fluids as ordered. Dr. Sedonia Small to follow remaining labs. Anticipate admit.   ____________________________________________  FINAL CLINICAL IMPRESSION(S) / ED DIAGNOSES  Final diagnoses:  Right upper quadrant abdominal pain     MEDICATIONS GIVEN DURING THIS VISIT:  Medications  0.9 %  sodium chloride infusion (has no administration in time range)  aztreonam (AZACTAM) 2 g in sodium chloride 0.9 % 100 mL IVPB (has no administration in time range)  metroNIDAZOLE  (FLAGYL) IVPB 500 mg (has no administration in time range)  vancomycin (VANCOCIN) IVPB 1000 mg/200 mL premix (has no administration in time range)  sodium chloride 0.9 % bolus 1,000 mL (1,000 mLs Intravenous New Bag/Given 05/12/20 2242)  acetaminophen (TYLENOL) tablet 1,000 mg (1,000 mg Oral Given 05/12/20 2242)     Note:  This document was prepared using Dragon voice recognition software and may include unintentional dictation errors.  Nanda Quinton, MD, [facility] Emergency Medicine    Kiearra Oyervides, [facility], MD 05/13/20 [phone]

## 2020-05-12 NOTE — Progress Notes (Signed)
A consult was received from an ED physician for Vancomycin & Aztreonam per pharmacy dosing.  The patient's profile has been reviewed for ht/wt/allergies/indication/available labs.    Medication history reviewed and patient has tolerated cephalosporins (Rocephin & Cefepime) on multiple occasions in the past.  A one time order has been placed for Vancomycin 1gm & Cefepime 2gm IV.   Further antibiotics/pharmacy consults should be ordered by admitting physician if indicated.                       Thank you, Netta Cedars PharmD 05/12/2020  11:12 PM

## 2020-05-13 ENCOUNTER — Observation Stay (HOSPITAL_COMMUNITY): Payer: Medicare HMO

## 2020-05-13 ENCOUNTER — Other Ambulatory Visit: Payer: Self-pay

## 2020-05-13 DIAGNOSIS — W19XXXA Unspecified fall, initial encounter: Secondary | ICD-10-CM | POA: Diagnosis not present

## 2020-05-13 DIAGNOSIS — L89151 Pressure ulcer of sacral region, stage 1: Secondary | ICD-10-CM | POA: Diagnosis not present

## 2020-05-13 DIAGNOSIS — Z90710 Acquired absence of both cervix and uterus: Secondary | ICD-10-CM | POA: Diagnosis not present

## 2020-05-13 DIAGNOSIS — N390 Urinary tract infection, site not specified: Secondary | ICD-10-CM | POA: Diagnosis present

## 2020-05-13 DIAGNOSIS — K7460 Unspecified cirrhosis of liver: Secondary | ICD-10-CM | POA: Diagnosis present

## 2020-05-13 DIAGNOSIS — E785 Hyperlipidemia, unspecified: Secondary | ICD-10-CM | POA: Diagnosis present

## 2020-05-13 DIAGNOSIS — F32A Depression, unspecified: Secondary | ICD-10-CM | POA: Diagnosis present

## 2020-05-13 DIAGNOSIS — A4101 Sepsis due to Methicillin susceptible Staphylococcus aureus: Secondary | ICD-10-CM | POA: Diagnosis not present

## 2020-05-13 DIAGNOSIS — R059 Cough, unspecified: Secondary | ICD-10-CM | POA: Diagnosis not present

## 2020-05-13 DIAGNOSIS — R188 Other ascites: Secondary | ICD-10-CM | POA: Diagnosis not present

## 2020-05-13 DIAGNOSIS — R000 Tachycardia, unspecified: Secondary | ICD-10-CM | POA: Diagnosis not present

## 2020-05-13 DIAGNOSIS — K7290 Hepatic failure, unspecified without coma: Secondary | ICD-10-CM | POA: Diagnosis not present

## 2020-05-13 DIAGNOSIS — K7469 Other cirrhosis of liver: Secondary | ICD-10-CM | POA: Diagnosis not present

## 2020-05-13 DIAGNOSIS — R0602 Shortness of breath: Secondary | ICD-10-CM | POA: Diagnosis not present

## 2020-05-13 DIAGNOSIS — R21 Rash and other nonspecific skin eruption: Secondary | ICD-10-CM | POA: Diagnosis not present

## 2020-05-13 DIAGNOSIS — R6510 Systemic inflammatory response syndrome (SIRS) of non-infectious origin without acute organ dysfunction: Secondary | ICD-10-CM | POA: Diagnosis present

## 2020-05-13 DIAGNOSIS — R7401 Elevation of levels of liver transaminase levels: Secondary | ICD-10-CM | POA: Diagnosis not present

## 2020-05-13 DIAGNOSIS — E871 Hypo-osmolality and hyponatremia: Secondary | ICD-10-CM | POA: Diagnosis present

## 2020-05-13 DIAGNOSIS — E870 Hyperosmolality and hypernatremia: Secondary | ICD-10-CM | POA: Diagnosis present

## 2020-05-13 DIAGNOSIS — R7989 Other specified abnormal findings of blood chemistry: Secondary | ICD-10-CM

## 2020-05-13 DIAGNOSIS — J9601 Acute respiratory failure with hypoxia: Secondary | ICD-10-CM | POA: Diagnosis not present

## 2020-05-13 DIAGNOSIS — Z96653 Presence of artificial knee joint, bilateral: Secondary | ICD-10-CM | POA: Diagnosis present

## 2020-05-13 DIAGNOSIS — J90 Pleural effusion, not elsewhere classified: Secondary | ICD-10-CM | POA: Diagnosis not present

## 2020-05-13 DIAGNOSIS — G9341 Metabolic encephalopathy: Secondary | ICD-10-CM | POA: Diagnosis not present

## 2020-05-13 DIAGNOSIS — G40209 Localization-related (focal) (partial) symptomatic epilepsy and epileptic syndromes with complex partial seizures, not intractable, without status epilepticus: Secondary | ICD-10-CM | POA: Diagnosis not present

## 2020-05-13 DIAGNOSIS — R69 Illness, unspecified: Secondary | ICD-10-CM | POA: Diagnosis not present

## 2020-05-13 DIAGNOSIS — G40909 Epilepsy, unspecified, not intractable, without status epilepticus: Secondary | ICD-10-CM | POA: Diagnosis not present

## 2020-05-13 DIAGNOSIS — Y92008 Other place in unspecified non-institutional (private) residence as the place of occurrence of the external cause: Secondary | ICD-10-CM | POA: Diagnosis not present

## 2020-05-13 DIAGNOSIS — E7800 Pure hypercholesterolemia, unspecified: Secondary | ICD-10-CM | POA: Diagnosis present

## 2020-05-13 DIAGNOSIS — I358 Other nonrheumatic aortic valve disorders: Secondary | ICD-10-CM | POA: Diagnosis not present

## 2020-05-13 DIAGNOSIS — S299XXA Unspecified injury of thorax, initial encounter: Secondary | ICD-10-CM | POA: Diagnosis present

## 2020-05-13 DIAGNOSIS — I864 Gastric varices: Secondary | ICD-10-CM | POA: Diagnosis present

## 2020-05-13 DIAGNOSIS — G2581 Restless legs syndrome: Secondary | ICD-10-CM | POA: Diagnosis present

## 2020-05-13 DIAGNOSIS — R509 Fever, unspecified: Secondary | ICD-10-CM | POA: Insufficient documentation

## 2020-05-13 DIAGNOSIS — K5900 Constipation, unspecified: Secondary | ICD-10-CM | POA: Diagnosis present

## 2020-05-13 DIAGNOSIS — K760 Fatty (change of) liver, not elsewhere classified: Secondary | ICD-10-CM | POA: Diagnosis present

## 2020-05-13 DIAGNOSIS — J811 Chronic pulmonary edema: Secondary | ICD-10-CM | POA: Diagnosis not present

## 2020-05-13 DIAGNOSIS — Z20822 Contact with and (suspected) exposure to covid-19: Secondary | ICD-10-CM | POA: Diagnosis not present

## 2020-05-13 DIAGNOSIS — N1330 Unspecified hydronephrosis: Secondary | ICD-10-CM | POA: Diagnosis not present

## 2020-05-13 DIAGNOSIS — R1011 Right upper quadrant pain: Secondary | ICD-10-CM | POA: Diagnosis not present

## 2020-05-13 DIAGNOSIS — N83202 Unspecified ovarian cyst, left side: Secondary | ICD-10-CM | POA: Diagnosis not present

## 2020-05-13 DIAGNOSIS — D329 Benign neoplasm of meninges, unspecified: Secondary | ICD-10-CM | POA: Diagnosis not present

## 2020-05-13 DIAGNOSIS — Z86011 Personal history of benign neoplasm of the brain: Secondary | ICD-10-CM | POA: Diagnosis not present

## 2020-05-13 DIAGNOSIS — R4182 Altered mental status, unspecified: Secondary | ICD-10-CM | POA: Diagnosis not present

## 2020-05-13 DIAGNOSIS — R6520 Severe sepsis without septic shock: Secondary | ICD-10-CM | POA: Diagnosis not present

## 2020-05-13 DIAGNOSIS — R296 Repeated falls: Secondary | ICD-10-CM | POA: Diagnosis present

## 2020-05-13 DIAGNOSIS — R7881 Bacteremia: Secondary | ICD-10-CM | POA: Diagnosis not present

## 2020-05-13 DIAGNOSIS — R5381 Other malaise: Secondary | ICD-10-CM | POA: Diagnosis present

## 2020-05-13 DIAGNOSIS — W010XXA Fall on same level from slipping, tripping and stumbling without subsequent striking against object, initial encounter: Secondary | ICD-10-CM | POA: Diagnosis not present

## 2020-05-13 DIAGNOSIS — I517 Cardiomegaly: Secondary | ICD-10-CM | POA: Diagnosis not present

## 2020-05-13 DIAGNOSIS — N200 Calculus of kidney: Secondary | ICD-10-CM | POA: Diagnosis present

## 2020-05-13 DIAGNOSIS — K769 Liver disease, unspecified: Secondary | ICD-10-CM

## 2020-05-13 DIAGNOSIS — E872 Acidosis: Secondary | ICD-10-CM | POA: Diagnosis not present

## 2020-05-13 LAB — BLOOD CULTURE ID PANEL (REFLEXED) - BCID2

## 2020-05-13 LAB — URINALYSIS, ROUTINE W REFLEX MICROSCOPIC
Bacteria, UA: NONE SEEN
Bilirubin Urine: NEGATIVE
Glucose, UA: NEGATIVE mg/dL
Ketones, ur: NEGATIVE mg/dL
Leukocytes,Ua: NEGATIVE
Nitrite: POSITIVE — AB
Protein, ur: NEGATIVE mg/dL
Specific Gravity, Urine: 1.043 — ABNORMAL HIGH (ref 1.005–1.030)
pH: 6 (ref 5.0–8.0)

## 2020-05-13 LAB — PROTIME-INR
INR: 1.4 — ABNORMAL HIGH (ref 0.8–1.2)
Prothrombin Time: 16.8 seconds — ABNORMAL HIGH (ref 11.4–15.2)

## 2020-05-13 LAB — CBC WITH DIFFERENTIAL/PLATELET
Abs Immature Granulocytes: 0.12 10*3/uL — ABNORMAL HIGH (ref 0.00–0.07)
Basophils Absolute: 0 10*3/uL (ref 0.0–0.1)
Basophils Relative: 0 %
Eosinophils Absolute: 0 10*3/uL (ref 0.0–0.5)
Eosinophils Relative: 0 %
HCT: 30 % — ABNORMAL LOW (ref 36.0–46.0)
Hemoglobin: 9.7 g/dL — ABNORMAL LOW (ref 12.0–15.0)
Immature Granulocytes: 1 %
Lymphocytes Relative: 6 %
Lymphs Abs: 0.8 10*3/uL (ref 0.7–4.0)
MCH: 30 pg (ref 26.0–34.0)
MCHC: 32.3 g/dL (ref 30.0–36.0)
MCV: 92.9 fL (ref 80.0–100.0)
Monocytes Absolute: 1 10*3/uL (ref 0.1–1.0)
Monocytes Relative: 7 %
Neutro Abs: 12.5 10*3/uL — ABNORMAL HIGH (ref 1.7–7.7)
Neutrophils Relative %: 86 %
Platelets: 164 10*3/uL (ref 150–400)
RBC: 3.23 MIL/uL — ABNORMAL LOW (ref 3.87–5.11)
RDW: 16.6 % — ABNORMAL HIGH (ref 11.5–15.5)
WBC: 14.4 10*3/uL — ABNORMAL HIGH (ref 4.0–10.5)
nRBC: 0 % (ref 0.0–0.2)

## 2020-05-13 LAB — CORTISOL-AM, BLOOD: Cortisol - AM: 17.1 ug/dL (ref 6.7–22.6)

## 2020-05-13 LAB — COMPREHENSIVE METABOLIC PANEL
ALT: 60 U/L — ABNORMAL HIGH (ref 0–44)
AST: 121 U/L — ABNORMAL HIGH (ref 15–41)
Albumin: 2.6 g/dL — ABNORMAL LOW (ref 3.5–5.0)
Alkaline Phosphatase: 140 U/L — ABNORMAL HIGH (ref 38–126)
Anion gap: 8 (ref 5–15)
BUN: 9 mg/dL (ref 6–20)
CO2: 21 mmol/L — ABNORMAL LOW (ref 22–32)
Calcium: 7.9 mg/dL — ABNORMAL LOW (ref 8.9–10.3)
Chloride: 109 mmol/L (ref 98–111)
Creatinine, Ser: 0.53 mg/dL (ref 0.44–1.00)
GFR, Estimated: >60 mL/min (ref >60)
Glucose, Bld: 113 mg/dL — ABNORMAL HIGH (ref 70–99)
Potassium: 3.2 mmol/L — ABNORMAL LOW (ref 3.5–5.1)
Sodium: 138 mmol/L (ref 135–145)
Total Bilirubin: 1.5 mg/dL — ABNORMAL HIGH (ref 0.3–1.2)
Total Protein: 5.3 g/dL — ABNORMAL LOW (ref 6.5–8.1)

## 2020-05-13 LAB — MAGNESIUM: Magnesium: 1.4 mg/dL — ABNORMAL LOW (ref 1.7–2.4)

## 2020-05-13 LAB — PROCALCITONIN: Procalcitonin: 0.56 ng/mL

## 2020-05-13 MED ORDER — POTASSIUM CHLORIDE 10 MEQ/100ML IV SOLN
10.0000 meq | INTRAVENOUS | Status: AC
  Administered 2020-05-13 (×4): 10 meq via INTRAVENOUS
  Filled 2020-05-13 (×4): qty 100

## 2020-05-13 MED ORDER — LEVETIRACETAM 500 MG PO TABS: 500.0000 mg | ORAL_TABLET | Freq: Two times a day (BID) | ORAL | Status: DC

## 2020-05-13 MED ORDER — HYDROMORPHONE HCL 1 MG/ML IJ SOLN
1.0000 mg | INTRAMUSCULAR | Status: DC | PRN
  Administered 2020-05-13 – 2020-05-19 (×21): 1 mg via INTRAVENOUS
  Filled 2020-05-13 (×21): qty 1

## 2020-05-13 MED ORDER — FLUOXETINE HCL 40 MG PO CAPS: 80.0000 mg | ORAL_CAPSULE | Freq: Every day | ORAL | Status: DC

## 2020-05-13 MED ORDER — VANCOMYCIN HCL IN DEXTROSE 1-5 GM/200ML-% IV SOLN
1000.0000 mg | INTRAVENOUS | Status: DC
  Administered 2020-05-13: 1000 mg via INTRAVENOUS
  Filled 2020-05-13: qty 200

## 2020-05-13 MED ORDER — LAMOTRIGINE 100 MG PO TABS
150.0000 mg | ORAL_TABLET | Freq: Two times a day (BID) | ORAL | Status: DC
  Administered 2020-05-13 – 2020-05-27 (×28): 150 mg via ORAL
  Filled 2020-05-13: qty 6
  Filled 2020-05-13: qty 2
  Filled 2020-05-13: qty 6
  Filled 2020-05-13 (×2): qty 2
  Filled 2020-05-13: qty 1
  Filled 2020-05-13 (×11): qty 2
  Filled 2020-05-13: qty 1
  Filled 2020-05-13 (×3): qty 2
  Filled 2020-05-13: qty 1
  Filled 2020-05-13 (×2): qty 2
  Filled 2020-05-13: qty 1
  Filled 2020-05-13 (×3): qty 2
  Filled 2020-05-13: qty 6

## 2020-05-13 MED ORDER — OXYCODONE HCL 5 MG PO TABS: 5.0000 mg | ORAL_TABLET | ORAL | Status: DC | PRN

## 2020-05-13 MED ORDER — FOLIC ACID 1 MG PO TABS
1.0000 mg | ORAL_TABLET | Freq: Every day | ORAL | Status: DC
  Administered 2020-05-13 – 2020-05-27 (×14): 1 mg via ORAL
  Filled 2020-05-13 (×15): qty 1

## 2020-05-13 MED ORDER — LEVETIRACETAM 500 MG PO TABS
1000.0000 mg | ORAL_TABLET | Freq: Two times a day (BID) | ORAL | Status: DC
  Administered 2020-05-13 – 2020-05-23 (×19): 1000 mg via ORAL
  Filled 2020-05-13 (×21): qty 2

## 2020-05-13 MED ORDER — FENTANYL CITRATE (PF) 100 MCG/2ML IJ SOLN
50.0000 ug | Freq: Once | INTRAMUSCULAR | Status: AC
  Administered 2020-05-13: 50 ug via INTRAVENOUS
  Filled 2020-05-13: qty 2

## 2020-05-13 MED ORDER — METRONIDAZOLE 500 MG/100ML IV SOLN
500.0000 mg | Freq: Three times a day (TID) | INTRAVENOUS | Status: DC
  Administered 2020-05-13 – 2020-05-14 (×3): 500 mg via INTRAVENOUS
  Filled 2020-05-13 (×4): qty 100

## 2020-05-13 MED ORDER — MORPHINE SULFATE (PF) 4 MG/ML IV SOLN
4.0000 mg | INTRAVENOUS | Status: DC | PRN
  Administered 2020-05-13: 4 mg via INTRAVENOUS
  Filled 2020-05-13: qty 1

## 2020-05-13 MED ORDER — ACETAMINOPHEN 325 MG PO TABS
650.0000 mg | ORAL_TABLET | Freq: Four times a day (QID) | ORAL | Status: DC | PRN
  Administered 2020-05-13 – 2020-05-26 (×7): 650 mg via ORAL
  Filled 2020-05-13 (×7): qty 2

## 2020-05-13 MED ORDER — LORAZEPAM 1 MG PO TABS
1.0000 mg | ORAL_TABLET | Freq: Every day | ORAL | Status: DC | PRN
  Administered 2020-05-18 – 2020-05-27 (×5): 1 mg via ORAL
  Filled 2020-05-13 (×7): qty 1

## 2020-05-13 MED ORDER — ONDANSETRON HCL 4 MG PO TABS: 4.0000 mg | ORAL_TABLET | Freq: Four times a day (QID) | ORAL | Status: DC | PRN

## 2020-05-13 MED ORDER — SODIUM CHLORIDE 0.9 % IV SOLN
2.0000 g | Freq: Three times a day (TID) | INTRAVENOUS | Status: DC
  Administered 2020-05-13 – 2020-05-14 (×4): 2 g via INTRAVENOUS
  Filled 2020-05-13 (×4): qty 2

## 2020-05-13 MED ORDER — LEVETIRACETAM 500 MG PO TABS: 1000.0000 mg | ORAL_TABLET | Freq: Every evening | ORAL | Status: DC

## 2020-05-13 MED ORDER — ACETAMINOPHEN 650 MG RE SUPP: 650.0000 mg | Freq: Four times a day (QID) | RECTAL | Status: DC | PRN

## 2020-05-13 MED ORDER — MAGNESIUM SULFATE 4 GM/100ML IV SOLN
4.0000 g | Freq: Once | INTRAVENOUS | Status: AC
  Administered 2020-05-13: 4 g via INTRAVENOUS
  Filled 2020-05-13: qty 100

## 2020-05-13 MED ORDER — HEPARIN SODIUM (PORCINE) 5000 UNIT/ML IJ SOLN
5000.0000 [IU] | Freq: Three times a day (TID) | INTRAMUSCULAR | Status: DC
  Administered 2020-05-13 – 2020-05-23 (×28): 5000 [IU] via SUBCUTANEOUS
  Filled 2020-05-13 (×33): qty 1

## 2020-05-13 MED ORDER — ONDANSETRON HCL 4 MG/2ML IJ SOLN
4.0000 mg | Freq: Four times a day (QID) | INTRAMUSCULAR | Status: DC | PRN
  Administered 2020-05-15 – 2020-05-19 (×5): 4 mg via INTRAVENOUS
  Filled 2020-05-13 (×5): qty 2

## 2020-05-13 NOTE — H&P (Signed)
TRH H&P    Patient Demographics:    [name], is a 55 y.o. female  MRN: [id number]  DOB - [birth year]  Admit Date - 05/12/2020  Referring MD/NP/PA: Sedonia Small  Outpatient Primary MD for the patient is [name], MD  Patient coming from: Home  Chief complaint- Fall   HPI:    [name]  is a 55 y.o. female, with history of seizures, mood disorder, benign brain tumor, fatty liver, hypercholesterol, and more presents the ED with a chief complaint of fall.  Patient reports that she did prior to presentation.  She reports that her house is being remodeled and she tripped on something and fell forward hit her head on the railing and landed on her right chest.  She reports that she hit the back of her head on the way down.  She had no loss of consciousness.  She has had no change in vision or hearing.  She reports that prior to the fall she did have dizziness, chest pain, nausea, but these are chronic daily symptoms for her.  She did not have palpitations.  Her main complaint at this time is that the right side of her chest hurts.  It is worse with deep breaths and with position changes.  She tried Tylenol at home with no relief.  She does admit to having fever prior to coming to the ED today.  She reports a T-max at home of 103.  At presentation her temperature was 101.  She denies cough.  She admits to urinary frequency but no dysuria or urgency.  She has been constipated but did take a laxative yesterday with adequate response.  She reports she had a UTI a month ago and had antibiotics for it and the symptoms cleared up.  She has no rashes or open sores on her body.  She reports nausea and vomiting x3 today prior to coming in on the ambulance.  There is nonbloody emesis.  She has no other complaints at this time.  Patient reports that she does not smoke, does not drink, does not use illicit drugs and is vaccinated for  COVID.  Patient is full code.  In the ED Temp 101, improved to 100.3, respiratory rate 18-26, blood pressure 111/70, satting as low as 89% on room air CT abdomen shows intrinsic liver disease, ascites, anasarca, hazy pancreatic stranding, thickened cecum and ascending colon. CT head is without acute changes Chest x-ray shows some basilar lingular atelectasis Negative COVID White blood cell count 12.4, hemoglobin 10.4 Chemistry panel shows a transaminitis and elevated T bili Lactic acid is elevated at 2.2 but improved to 1.8 after 1 L bolus EKG shows sinus tachycardia with a heart rate of 108, QTc 490 Cefepime Flagyl and Vanco were started in the ED 1 L bolus as mentioned above and then followed by 150 mL/h fluids Admission requested for further work-up of fever of unknown source mental     Review of systems:    In addition to the HPI above,  Admits to fever No Headache, admits  to chronically worsening vision no change in hearing, No problems swallowing food or Liquids,  admits to chest wall pain, no cough or Shortness of Breath, Admits to abdominal pain, nausea, vomiting, constipation No Blood in stool or Urine, No dysuria, No new skin rashes or bruises, No new joints pains-aches,  No new weakness, tingling, numbness in any extremity, No recent weight gain or loss, No polyuria, polydypsia or polyphagia, No significant Mental Stressors.  All other systems reviewed and are negative.    Past History of the following :    Past Medical History:  Diagnosis Date  . Arthritis    "knees" (04/22/2012)  . Brain tumor (benign) ([place])   . Chronic bronchitis ([place])    "yearly; when the weather changes" (04/22/2012)  . Colon polyp   . Colon polyps    adenomatous and hyperplastic-  . Depression   . Eczema   . Epilepsy ([place])    "been having them right often here lately" (04/22/2012)  . Fatty liver   . High cholesterol   . History of kidney stones   . Osteoarthritis    [name]  04/22/2012  . Other convulsions 05/21/12   non-epileptic spells  . Restless leg   . Seizures ([place])   . Vertigo       Past Surgical History:  Procedure Laterality Date  . ABDOMINAL HYSTERECTOMY  2001  . BLADDER SUSPENSION    . BUNIONECTOMY Left 2000  . CESAREAN SECTION  1987; 1988  . COLONOSCOPY    . JOINT REPLACEMENT    . MASS EXCISION  10/22/2011   Procedure: EXCISION MASS;  Surgeon: [name], MD;  Location: [facility];  Service: General;  Laterality: Right;  excision right buttock mass  . TOTAL HIP ARTHROPLASTY Left 1993; 1995; 2000  . TOTAL KNEE ARTHROPLASTY Left 05/07/2015   Procedure: TOTAL LEFT KNEE ARTHROPLASTY;  Surgeon: [name], MD;  Location: [facility];  Service: Orthopedics;  Laterality: Left;  . TOTAL KNEE ARTHROPLASTY Right 10/01/2015   Procedure: RIGHT TOTAL KNEE ARTHROPLASTY;  Surgeon: [name], MD;  Location: [facility];  Service: Orthopedics;  Laterality: Right;      Social History:      Social History   Tobacco Use  . Smoking status: Never Smoker  . Smokeless tobacco: Never Used  Substance Use Topics  . Alcohol use: No    Alcohol/week: 0.0 standard drinks    Comment: 12-25-2015 per pt no       Family History :     Family History  Problem Relation Age of Onset  . Cancer Mother   . Depression Father   . Cancer Brother   . Diabetes Brother   . Cancer Maternal Aunt   . Diabetes Maternal Aunt   . Bipolar disorder Son   . Drug abuse Son   . Heart attack Sister 9       during severe illness      Home Medications:   Prior to Admission medications   Medication Sig Start Date End Date Taking? Authorizing Provider  FLUoxetine (PROZAC) 40 MG capsule Take 2 capsules (80 mg total) by mouth daily. 04/26/20 07/25/20  Norman Clay, MD  folic acid (FOLVITE) 1 MG tablet Take 1 tablet (1 mg total) by mouth daily. 02/24/20   Orson Eva, MD  lamoTRIgine (LAMICTAL) 150 MG tablet Take 1 tablet (150 mg total) by mouth 2 (two) times daily. For mood  control Patient taking differently: Take 150 mg by mouth 2 (two) times daily. 12/26/19  Ventura Sellers, MD  levETIRAcetam (KEPPRA) 1000 MG tablet TAKE 1 TABLET BY MOUTH IN THE MORNING AND 1 AT BEDTIME 05/10/20   Vaslow, Acey Lav, MD  LORazepam (ATIVAN) 1 MG tablet Take 1 tablet (1 mg total) by mouth daily as needed for anxiety. 03/26/20   Norman Clay, MD     Allergies:     Allergies  Allergen Reactions  . Acetaminophen Other (See Comments)    Has fatty deposits on liver  . Benadryl [Diphenhydramine Hcl] Other (See Comments)    hyperactivity and seizures  . Codeine Itching and Rash    seizures  . Dilantin [Phenytoin Sodium Extended] Other (See Comments)    Elevated LFT's  . Melatonin Other (See Comments)    seizures  . Tramadol Other (See Comments)    "causes seizures"  . Ultram [Tramadol Hcl] Other (See Comments)    Seizures  . Vimpat [Lacosamide] Other (See Comments)    Severe dizziness  . Mirtazapine Other (See Comments)    Potentiated seizure activity  . Betadine [Povidone Iodine] Rash  . Latex Rash  . Penicillins Itching and Rash    Patient can tolerate cephalosporins  . Sulfa Antibiotics Rash  . Tape Rash and Other (See Comments)    Paper tape please Paper tape please  . Vicodin [Hydrocodone-Acetaminophen] Itching     Physical Exam:   Vitals  Blood pressure 116/75, pulse (!) 103, temperature 99.2 F (37.3 C), resp. rate 18, height 5' (1.524 m), weight 58 kg, SpO2 91 %.  1.  General: Patient lying with head of bed elevated, chronically ill-appearing  2. Psychiatric: Mood and behavior normal for situation, alert and oriented, pleasant and cooperative with exam  3. Neurologic: Face is symmetric, speech and language are normal, moving all 4 extremities voluntarily, no focal deficit on limited exam  4. HEENMT:  Head is atraumatic, normocephalic, pupils reactive, neck is supple, trachea is midline, mucous membranes mildly dry  5. Respiratory : Lungs are  clear to auscultation bilaterally without wheezing, rhonchi, rales, no clubbing, no cyanosis  6. Cardiovascular : Heart rate is tachycardic, rhythm is regular, no murmurs rubs or gallops, no peripheral edema  7. Gastrointestinal:  Abdomen is soft, diffusely tender, hepatomegaly, bowel sounds active, mildly distended  8. Skin:  Skin is warm dry and intact limited exam  9.Musculoskeletal:  Right lower ribs tender to palpation, no calf tenderness, no peripheral edema    Data Review:    CBC Recent Labs  Lab 05/12/20 2232  WBC 12.4*  HGB 10.4*  HCT 30.3*  PLT 214  MCV 91.0  MCH 31.2  MCHC 34.3  RDW 16.8*   ------------------------------------------------------------------------------------------------------------------  Results for orders placed or performed during the hospital encounter of 05/12/20 (from the past 48 hour(s))  Lipase, blood     Status: None   Collection Time: 05/12/20 10:32 PM  Result Value Ref Range   Lipase 37 11 - 51 U/L    Comment: Performed at [facility], [address], [place], [place] [address]  Comprehensive metabolic panel     Status: Abnormal   Collection Time: 05/12/20 10:32 PM  Result Value Ref Range   Sodium 134 (L) 135 - 145 mmol/L   Potassium 3.7 3.5 - 5.1 mmol/L   Chloride 104 98 - 111 mmol/L   CO2 21 (L) 22 - 32 mmol/L   Glucose, Bld 109 (H) 70 - 99 mg/dL    Comment: Glucose reference range applies only to samples taken after fasting for at least 8  hours.   BUN 10 6 - 20 mg/dL   Creatinine, Ser 0.62 0.44 - 1.00 mg/dL   Calcium 8.6 (L) 8.9 - 10.3 mg/dL   Total Protein 6.0 (L) 6.5 - 8.1 g/dL   Albumin 2.9 (L) 3.5 - 5.0 g/dL   AST 138 (H) 15 - 41 U/L   ALT 66 (H) 0 - 44 U/L   Alkaline Phosphatase 165 (H) 38 - 126 U/L   Total Bilirubin 1.9 (H) 0.3 - 1.2 mg/dL   GFR, Estimated >60 >60 mL/min    Comment: (NOTE) Calculated using the CKD-EPI Creatinine Equation (2021)    Anion gap 9 5 - 15    Comment: Performed at [facility], [address], [place], [place] [address]  CBC     Status: Abnormal   Collection Time: 05/12/20 10:32 PM  Result Value Ref Range   WBC 12.4 (H) 4.0 - 10.5 K/uL   RBC 3.33 (L) 3.87 - 5.11 MIL/uL   Hemoglobin 10.4 (L) 12.0 - 15.0 g/dL   HCT 30.3 (L) 36.0 - 46.0 %   MCV 91.0 80.0 - 100.0 fL   MCH 31.2 26.0 - 34.0 pg   MCHC 34.3 30.0 - 36.0 g/dL   RDW 16.8 (H) 11.5 - 15.5 %   Platelets 214 150 - 400 K/uL   nRBC 0.0 0.0 - 0.2 %    Comment: Performed at [facility], [address] [place], [place] [address]  Culture, blood (routine x 2)     Status: None (Preliminary result)   Collection Time: 05/12/20 10:45 PM   Specimen: BLOOD LEFT FOREARM  Result Value Ref Range   Specimen Description BLOOD LEFT FOREARM    Special Requests      Blood Culture results may not be optimal due to an excessive volume of blood received in culture bottles BOTTLES DRAWN AEROBIC AND ANAEROBIC Performed at [facility], [address], [place], [place] [address]    Culture PENDING    Report Status PENDING   Lactic acid, plasma     Status: Abnormal   Collection Time: 05/12/20 10:45 PM  Result Value Ref Range   Lactic Acid, Venous 2.2 (HH) 0.5 - 1.9 mmol/L    Comment: CRITICAL RESULT CALLED TO, READ BACK BY AND VERIFIED WITH: GESELLE,S 2301 05/12/2020 COLEMAN,R Performed at [facility], [address], [place], [place] [address]   Resp Panel by RT-PCR (Flu A&B, Covid) Nasopharyngeal Swab     Status: None   Collection Time: 05/12/20 10:46 PM   Specimen: Nasopharyngeal Swab; Nasopharyngeal(NP) swabs in vial transport medium  Result Value Ref Range   SARS Coronavirus 2 by RT PCR NEGATIVE NEGATIVE    Comment: (NOTE) SARS-CoV-2 target nucleic acids are NOT DETECTED.  The SARS-CoV-2 RNA is generally detectable in upper respiratory specimens during the acute phase of infection. The lowest concentration of SARS-CoV-2 viral copies this assay can detect is 138 copies/mL. A negative result does not preclude  SARS-Cov-2 infection and should not be used as the sole basis for treatment or other patient management decisions. A negative result may occur with  improper specimen collection/handling, submission of specimen other than nasopharyngeal swab, presence of viral mutation(s) within the areas targeted by this assay, and inadequate number of viral copies(<138 copies/mL). A negative result must be combined with clinical observations, patient history, and epidemiological information. The expected result is Negative.  Fact Sheet for Patients:  EntrepreneurPulse.com.au  Fact Sheet for Healthcare Providers:  IncredibleEmployment.be  This test is no t yet approved  or cleared by the [place] and  has been authorized for detection and/or diagnosis of SARS-CoV-2 by FDA under an Emergency Use Authorization (EUA). This EUA will remain  in effect (meaning this test can be used) for the duration of the COVID-19 declaration under Section 564(b)(1) of the Act, 21 U.S.C.section 360bbb-3(b)(1), unless the authorization is terminated  or revoked sooner.       Influenza A by PCR NEGATIVE NEGATIVE   Influenza B by PCR NEGATIVE NEGATIVE    Comment: (NOTE) The Xpert Xpress SARS-CoV-2/FLU/RSV plus assay is intended as an aid in the diagnosis of influenza from Nasopharyngeal swab specimens and should not be used as a sole basis for treatment. Nasal washings and aspirates are unacceptable for Xpert Xpress SARS-CoV-2/FLU/RSV testing.  Fact Sheet for Patients: EntrepreneurPulse.com.au  Fact Sheet for Healthcare Providers: IncredibleEmployment.be  This test is not yet approved or cleared by the [place] FDA and has been authorized for detection and/or diagnosis of SARS-CoV-2 by FDA under an Emergency Use Authorization (EUA). This EUA will remain in effect (meaning this test can be used) for the duration of the COVID-19  declaration under Section 564(b)(1) of the Act, 21 U.S.C. section 360bbb-3(b)(1), unless the authorization is terminated or revoked.  Performed at [facility], [address], [place], [place] [address]   Culture, blood (routine x 2)     Status: None (Preliminary result)   Collection Time: 05/12/20 11:10 PM   Specimen: BLOOD RIGHT HAND  Result Value Ref Range   Specimen Description BLOOD RIGHT HAND    Special Requests      Blood Culture adequate volume BOTTLES DRAWN AEROBIC AND ANAEROBIC Performed at [facility], [address] [place], [place] [address]    Culture PENDING    Report Status PENDING   Lactic acid, plasma     Status: None   Collection Time: 05/12/20 11:10 PM  Result Value Ref Range   Lactic Acid, Venous 1.8 0.5 - 1.9 mmol/L    Comment: Performed at [facility], [address] [place], [place] [address]  Urinalysis, Routine w reflex microscopic Urine, Clean Catch     Status: Abnormal   Collection Time: 05/13/20  1:41 AM  Result Value Ref Range   Color, Urine YELLOW YELLOW   APPearance CLEAR CLEAR   Specific Gravity, Urine 1.043 (H) 1.005 - 1.030   pH 6.0 5.0 - 8.0   Glucose, UA NEGATIVE NEGATIVE mg/dL   Hgb urine dipstick SMALL (A) NEGATIVE   Bilirubin Urine NEGATIVE NEGATIVE   Ketones, ur NEGATIVE NEGATIVE mg/dL   Protein, ur NEGATIVE NEGATIVE mg/dL   Nitrite POSITIVE (A) NEGATIVE   Leukocytes,Ua NEGATIVE NEGATIVE   RBC / HPF 0-5 0 - 5 RBC/hpf   WBC, UA 6-10 0 - 5 WBC/hpf   Bacteria, UA NONE SEEN NONE SEEN   Squamous Epithelial / LPF 0-5 0 - 5   Mucus PRESENT     Comment: Performed at [facility], [address], [place],  [address]    Chemistries  Recent Labs  Lab 05/12/20 2232  NA 134*  K 3.7  CL 104  CO2 21*  GLUCOSE 109*  BUN 10  CREATININE 0.62  CALCIUM 8.6*  AST 138*  ALT 66*  ALKPHOS 165*  BILITOT 1.9*    ------------------------------------------------------------------------------------------------------------------  ------------------------------------------------------------------------------------------------------------------ GFR: Estimated Creatinine Clearance: 64.1 mL/min (by C-G formula based on SCr of 0.62 mg/dL). Liver Function Tests: Recent Labs  Lab 05/12/20 2232  AST 138*  ALT 66*  ALKPHOS 165*  BILITOT 1.9*  PROT 6.0*  ALBUMIN 2.9*   Recent Labs  Lab 05/12/20 2232  LIPASE 37   No results for input(s): AMMONIA in the last 168 hours. Coagulation Profile: No results for input(s): INR, PROTIME in the last 168 hours. Cardiac Enzymes: No results for input(s): CKTOTAL, CKMB, CKMBINDEX, TROPONINI in the last 168 hours. BNP (last 3 results) No results for input(s): PROBNP in the last 8760 hours. HbA1C: No results for input(s): HGBA1C in the last 72 hours. CBG: No results for input(s): GLUCAP in the last 168 hours. Lipid Profile: No results for input(s): CHOL, HDL, LDLCALC, TRIG, CHOLHDL, LDLDIRECT in the last 72 hours. Thyroid Function Tests: No results for input(s): TSH, T4TOTAL, FREET4, T3FREE, THYROIDAB in the last 72 hours. Anemia Panel: No results for input(s): VITAMINB12, FOLATE, FERRITIN, TIBC, IRON, RETICCTPCT in the last 72 hours.  --------------------------------------------------------------------------------------------------------------- Urine analysis:    Component Value Date/Time   COLORURINE YELLOW 05/13/2020 0141   APPEARANCEUR CLEAR 05/13/2020 0141   APPEARANCEUR Clear 01/30/2017 1020   LABSPEC 1.043 (H) 05/13/2020 0141   PHURINE 6.0 05/13/2020 0141   GLUCOSEU NEGATIVE 05/13/2020 0141   HGBUR SMALL (A) 05/13/2020 0141   BILIRUBINUR NEGATIVE 05/13/2020 0141   BILIRUBINUR Negative 01/30/2017 1020   KETONESUR NEGATIVE 05/13/2020 0141   PROTEINUR NEGATIVE 05/13/2020 0141   UROBILINOGEN negative 04/19/2014 1332   UROBILINOGEN 0.2 12/04/2013  1414   NITRITE POSITIVE (A) 05/13/2020 0141   LEUKOCYTESUR NEGATIVE 05/13/2020 0141      Imaging Results:    CT HEAD WO CONTRAST  Result Date: 05/13/2020 CLINICAL DATA:  Fall 2 days prior EXAM: CT HEAD WITHOUT CONTRAST TECHNIQUE: Contiguous axial images were obtained from the base of the skull through the vertex without intravenous contrast. COMPARISON:  CT 02/20/2020 FINDINGS: Brain: No evidence of acute infarction, hemorrhage, hydrocephalus, extra-axial collection, visible mass lesion or mass effect. Vascular: Atherosclerotic calcification of the carotid siphons. No hyperdense vessel. Skull: No calvarial fracture or suspicious osseous lesion. No scalp swelling or hematoma. Sinuses/Orbits: Paranasal sinuses are predominantly clear. Mastoid air cells appear chronically hypo pneumatized. Middle ear cavities are clear. Debris in the bilateral external auditory canals. Included orbital structures are unremarkable. Other: Asymmetric thickening and calcification in the left parotid gland is a chronic, nonspecific finding, similar to comparison study. IMPRESSION: No acute intracranial abnormality. No calvarial fracture, significant scalp swelling or hematoma. Intracranial atherosclerosis. Asymmetric thickening and calcification in the left parotid gland, a nonspecific, chronic finding unchanged from prior. Can reflect sequela of a chronic inflammatory process. Debris in the external auditory canals, correlate for cerumen impaction. Electronically Signed   By: Lovena Le M.D.   On: 05/13/2020 00:44   CT ABDOMEN PELVIS W CONTRAST  Result Date: 05/13/2020 CLINICAL DATA:  Abdominal pain, fever, right upper quadrant pain EXAM: CT ABDOMEN AND PELVIS WITH CONTRAST TECHNIQUE: Multidetector CT imaging of the abdomen and pelvis was performed using the standard protocol following bolus administration of intravenous contrast. CONTRAST:  132m OMNIPAQUE IOHEXOL 300 MG/ML  SOLN COMPARISON:  CT 02/12/2020 FINDINGS: Lower  chest: Basilar atelectatic changes in the lungs including more bandlike areas of subsegmental atelectasis or scarring. Normal heart size. No pericardial effusion. Hepatobiliary: Heterogeneous hepatic attenuation with a nodular liver surface contour concerning for intrinsic liver disease/cirrhosis. No concerning focal liver lesion. Moderate gallbladder distension. No significant gallbladder wall thickening or pericholecystic inflammation is seen. No visible calcified gallstones or biliary ductal dilatation. Pancreas: Slightly edematous appearance of the pancreatic parenchyma with very faint peripancreatic haze, uniform enhancement. No pancreatic ductal  dilatation. Spleen: Mild splenomegaly.  No concerning focal splenic lesion. Adrenals/Urinary Tract: Normal adrenals. Kidneys enhance symmetrically. Symmetric and uniform excretion. No concerning focal renal lesion. Bilateral renal cortical scarring. Nonobstructing calculi present in both kidneys. No obstructive urolithiasis or hydronephrosis is seen at this time. Bladder is unremarkable for the degree of distention. Stomach/Bowel: Paraesophageal venous collaterals and varices. Some minimal collateralization and varices about the gastric fundus as well. Thickening towards the gastric antrum likely reflecting normal peristalsis. Air and fluid-filled appearance of the duodenum with a normal sweep across the midline abdomen. Slightly edematous appearance diffusely through the small bowel is nonspecific given additional findings above. Moderate right-sided colonic stool burden, particularly within the cecum. Some mild circumferential thickening and mucosal hyperemia about the cecum and ascending colon is nonspecific given hepatic features. No evidence of mechanical obstruction. Appendix is not well visualized. Vascular/Lymphatic: Atherosclerotic calcifications within the abdominal aorta and branch vessels. No aneurysm or ectasia. Paraesophageal and gastric varices with  venous collaterals, as above. Splenorenal collateralization. Upper abdominal varices.No enlarged abdominopelvic lymph nodes. Few scattered calcified lymph nodes are present in the abdomen and pelvis. Reproductive: Portion the pelvis obscured by streak artifact. 2.6 cm cystic focus in the left adnexa, unchanged from comparison. No concerning right adnexal lesion. Prior hysterectomy. Other: Increasing edematous changes of the central mesentery. Small volume low-attenuation fluid in the deep pelvis as well as trace perihepatic ascites. Circumferential body wall edema. No bowel containing hernia. No free abdominopelvic air. Musculoskeletal: Multilevel degenerative changes are present in the imaged portions of the spine. Grade 1 anterolisthesis L4 on L5 without spondylolysis, unchanged from prior. Prior left hip arthroplasty with chronic bony remodeling and a similar appearance overall to comparison prior. IMPRESSION: Constellation of features suggestive of intrinsic liver disease/cirrhosis with features of portal hypertension including a nodular, heterogeneous liver with paraesophageal and gastric varices, additional upper abdominal venous collateralization, splenorenal shunting and splenomegaly. Interval development of a small volume ascites as well as additional features of at least mild or early developing anasarca with circumferential body wall edema and central mesenteric edema. Some hazy peripancreatic stranding may be present, possibly related to a diffusely edematous appearance of the mesentery though could correlate with lipase. Mildly thickened appearance of the cecum and ascending colon as well as a diffusely edematous appearance of the small bowel is nonspecific in the setting of suspected intrinsic liver disease. Could reflect portal enteropathy/colopathy though should exclude clinical symptoms of enterocolitis. Bilateral nonobstructing nephrolithiasis. No obstructive urolithiasis or hydronephrosis at this  time. Bilateral renal cortical scarring. Prior hysterectomy. Stable 2.7 cm left ovarian cyst. Continued stability favoring a benign process. No follow-up imaging recommended. Note: This recommendation does not apply to premenarchal patients and to those with increased risk (genetic, family history, elevated tumor markers or other high-risk factors) of ovarian cancer. Reference: JACR 2020 Feb; 17(2):248-254 Aortic Atherosclerosis (ICD10-I70.0). Electronically Signed   By: Lovena Le M.D.   On: 05/13/2020 00:35   DG Chest Portable 1 View  Result Date: 05/12/2020 CLINICAL DATA:  Right sided rib pain. EXAM: PORTABLE CHEST 1 VIEW COMPARISON:  February 12, 2020 FINDINGS: A very mild amount of linear atelectasis is seen within the right lung base. There is no evidence of acute infiltrate, pleural effusion or pneumothorax. The heart size and mediastinal contours are within normal limits. A chronic seventh right rib fracture is seen. IMPRESSION: 1. Very mild right basilar linear atelectasis. 2. Chronic seventh right rib fracture. Electronically Signed   By: Virgina Norfolk M.D.   On: 05/12/2020 23:16  Assessment & Plan:    Principal Problem:   SIRS (systemic inflammatory response syndrome) (HCC) Active Problems:   Elevated liver function tests   Fall at home, initial encounter   Rib injury   1. SIRS criteria met 1. Temp 101, respiratory rate 26, white blood cell count 12.4, lactic acid 2.2 2. Chest x-ray shows basilar atelectasis which is expected with the rib injury, but no definite infiltrate 3. UA is pending 4. CT abdomen shows no infectious etiology 5. Blood culture pending 6. Lactic acid improved to 1.8, will not continue to trend 7. Cefepime, Flagyl, vancomycin continued 8. 1 L bolus in ED 9. Continue IV fluids 10. Per sepsis order set cortisol and procalcitonin in the a.m. 2. Elevated liver function tests 1. Has a known history of steatosis, transaminases are elevated today with  fever and nausea vomiting 2. Checking acute hepatitis panel 3. CT abdomen does show worsening liver disease when compared to the last -at the end of January 2022 4. Trend in the a.m. 5. Right upper quadrant ultrasound to better characterize in the a.m. 6. Last right upper quadrant ultrasound was February 23, 2020, and showed no abnormality of the liver gallbladder 7. Continue to monitor 3. Fall at home initial encounter 1. Mechanical fall 2. PT eval and treat 4. Rib injury 1. Secondary to fall 2. Incentive spirometer 3. Pain control   DVT Prophylaxis-   Heparin- SCDs  AM Labs Ordered, also please review Full Orders  Family Communication: No family at bedside Code Status: Full  Admission status: ObservationTime spent in minutes : [place] DO

## 2020-05-13 NOTE — Progress Notes (Signed)
Patient admitted to the hospital earlier this morning by Dr. Clearence Ped  Patient seen and examined. She is laying in bed and appears uncomfortable and in pain.  She complains of pain in her abdomen and her chest, mostly on the right side.  Family reports that she had been vomiting prior to admission.  She has not had any diarrhea.  She was also febrile prior to arrival.  She recently had a fall after which her pain in her right side began.  Since her fall, she has had shortness of breath, mainly due to pain in her chest.  She is tender to palpation throughout her abdomen, more so on the right.  Lungs are clear bilaterally.  Patient was admitted to the hospital with SIRS criteria (fever, increased respiratory rate, leukocytosis).  She does not have any clear source of infection at this time.  Chest x-ray did not show any evidence of infiltrates.  CT abdomen does not show any obvious etiology.  There is mention of possible enterocolitis, but she is not having any diarrhea.  Urinalysis is positive for nitrates, urine culture is in process.  There is mention of some mild ascites, ?  Developing SBP.  We will request radiology to evaluate in a.m. to see if they can obtain a sample of ascitic fluid for further evaluation.  Blood cultures have been sent and are in process.  She been started on broad-spectrum antibiotics.  Will need to continue to observe for now and follow cultures to determine further etiology of fevers.  She did have a fall prior to admission.  Physical therapy evaluation has been requested  Her right-sided rib pain is likely related to soreness from her fall.  Chest x-ray does not show any new rib fractures.  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  She is currently following with [facility].  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  Liver enzymes are chronically  elevated.  She has a history of seizure disorder and will be continued on Keppra and Lamictal.  Patient's niece was updated by phone  Kathie Dike

## 2020-05-13 NOTE — ED Notes (Signed)
PT states she is still in a lot of pain after receiving Morphine per MAR. MD made aware.

## 2020-05-13 NOTE — Progress Notes (Signed)
Pharmacy Antibiotic Note  [name] is a 55 y.o. female admitted on 05/12/2020 with sepsis.  Pharmacy has been consulted for vancomycin and cefepime dosing.  Plan: Vancomycin 1000mg  IV Q24H. Goal AUC 400-550.  Expected AUC 450.  SCr used 0.8. Cefepime 2g IV Q8H.  Height: 5' (152.4 cm) Weight: 58 kg (127 lb 13.9 oz) IBW/kg (Calculated) : 45.5  Temp (24hrs), Avg:100.2 F (37.9 C), Min:99.2 F (37.3 C), Max:101 F (38.3 C)  Recent Labs  Lab 05/12/20 2232 05/12/20 2245 05/12/20 2310  WBC 12.4*  --   --   CREATININE 0.62  --   --   LATICACIDVEN  --  2.2* 1.8    Estimated Creatinine Clearance: 64.1 mL/min (by C-G formula based on SCr of 0.62 mg/dL).    Allergies  Allergen Reactions  . Acetaminophen Other (See Comments)    Has fatty deposits on liver  . Benadryl [Diphenhydramine Hcl] Other (See Comments)    hyperactivity and seizures  . Codeine Itching and Rash    seizures  . Dilantin [Phenytoin Sodium Extended] Other (See Comments)    Elevated LFT's  . Melatonin Other (See Comments)    seizures  . Tramadol Other (See Comments)    "causes seizures"  . Ultram [Tramadol Hcl] Other (See Comments)    Seizures  . Vimpat [Lacosamide] Other (See Comments)    Severe dizziness  . Mirtazapine Other (See Comments)    Potentiated seizure activity  . Betadine [Povidone Iodine] Rash  . Latex Rash  . Penicillins Itching and Rash    Patient can tolerate cephalosporins  . Sulfa Antibiotics Rash  . Tape Rash and Other (See Comments)    Paper tape please Paper tape please  . Vicodin [Hydrocodone-Acetaminophen] Itching     Thank you for allowing pharmacy to be a part of this patient's care.  [name], PharmD, BCPS  05/13/2020 2:54 AM

## 2020-05-13 NOTE — ED Notes (Signed)
Pt refused lunch

## 2020-05-13 NOTE — Evaluation (Signed)
Physical Therapy Evaluation Patient Details Name: [name] MRN: [id number] DOB: [birth year] Today's Date: 05/13/2020   History of Present Illness  [name]  is a 55 y.o. female, with history of seizures, mood disorder, benign brain tumor, fatty liver, hypercholesterol, and more presents the ED with a chief complaint of fall.  Patient reports that she did prior to presentation.  She reports that her house is being remodeled and she tripped on something and fell forward hit her head on the railing and landed on her right chest.  She reports that she hit the back of her head on the way down.  She had no loss of consciousness.  She has had no change in vision or hearing.  She reports that prior to the fall she did have dizziness, chest pain, nausea, but these are chronic daily symptoms for her.  She did not have palpitations.  Her main complaint at this time is that the right side of her chest hurts.  It is worse with deep breaths and with position changes.  She tried Tylenol at home with no relief.  She does admit to having fever prior to coming to the ED today.  She reports a T-max at home of 103.  At presentation her temperature was 101.  She denies cough.  She admits to urinary frequency but no dysuria or urgency.  She has been constipated but did take a laxative yesterday with adequate response.  She reports she had a UTI a month ago and had antibiotics for it and the symptoms cleared up.  She has no rashes or open sores on her body.  She reports nausea and vomiting x3 today prior to coming in on the ambulance.  There is nonbloody emesis.  She has no other complaints at this time.   Clinical Impression   Patient able to demonstrate functional mobility for bed mobility and transfers at an independent to modified independent level using RW for BUE support.  Demonstrates ambulation with supervision to modified independent on level surfaces albeit with slow, short step length but not LOB or  difficulty in negotiating turns.  Pt exhibits exertion and fatigue with ambulation and O2 sat decline from 94% to 84% with extertion and cues for pursed-lip breathing which improved sat rate to 92%.  Pt able to negotiate 8" step stool with RW for BUE support and CGA for steadying support and no LOB experienced.  Patient discharged to care of nursing for ambulation daily as tolerated for length of stay.    Follow Up Recommendations Home health PT    Equipment Recommendations  None recommended by PT    Recommendations for Other Services       Precautions / Restrictions Restrictions Weight Bearing Restrictions: No      Mobility  Bed Mobility Overal bed mobility: Independent                  Transfers Overall transfer level: Modified independent Equipment used: Rolling walker (2 wheeled)                Ambulation/Gait Ambulation/Gait assistance: Supervision Gait Distance (Feet): 40 Feet Assistive device: Rolling walker (2 wheeled) Gait Pattern/deviations: Decreased step length - right;Decreased step length - left;Steppage Gait velocity: decreased   General Gait Details: slow, small steps  Stairs Stairs: Yes Stairs assistance: Min guard Stair Management: With walker Number of Stairs: 1 General stair comments: utilized step stool in exam room using RW  Wheelchair Mobility    Modified Rankin (Stroke  Patients Only)       Balance Overall balance assessment: Modified Independent                                           Pertinent Vitals/Pain Pain Assessment: No/denies pain (reports discomfort in right ribs)    Home Living Family/patient expects to be discharged to:: Private residence Living Arrangements: Parent Available Help at Discharge: Family;Available PRN/intermittently Type of Home: Mobile home Home Access: Stairs to enter Entrance Stairs-Rails: Right;Left;Can reach both Entrance Stairs-Number of Steps: 4 Home Layout: One  level Home Equipment: Shower seat;Bedside commode;Walker - 4 wheels Additional Comments: Pt reports her mom has been staying with her at night since last d/c to assist if needed    Prior Function Level of Independence: Needs assistance   Gait / Transfers Assistance Needed: pt reports using rollator walker in the home, pt endorses "a few" falls recently  ADL's / Homemaking Assistance Needed: pt reports independent with bathing and dressing; mom completes cooking,cleaning and grocery shopping        Hand Dominance        Extremity/Trunk Assessment        Lower Extremity Assessment Lower Extremity Assessment: Generalized weakness    Cervical / Trunk Assessment Cervical / Trunk Assessment: Normal  Communication   Communication: No difficulties  Cognition Arousal/Alertness: Awake/alert Behavior During Therapy: WFL for tasks assessed/performed Overall Cognitive Status: Within Functional Limits for tasks assessed                                        General Comments      Exercises     Assessment/Plan    PT Assessment All further PT needs can be met in the next venue of care  PT Problem List Decreased activity tolerance       PT Treatment Interventions      PT Goals (Current goals can be found in the Care Plan section)  Acute Rehab PT Goals Patient Stated Goal: return home PT Goal Formulation: With patient Time For Goal Achievement: 05/13/20 Potential to Achieve Goals: Good    Frequency  Evaluation and one time tx   Barriers to discharge  None at this time      Co-evaluation               AM-PAC PT "6 Clicks" Mobility  Outcome Measure Help needed turning from your back to your side while in a flat bed without using bedrails?: None Help needed moving from lying on your back to sitting on the side of a flat bed without using bedrails?: None Help needed moving to and from a bed to a chair (including a wheelchair)?: None Help needed  standing up from a chair using your arms (e.g., wheelchair or bedside chair)?: None Help needed to walk in hospital room?: A Little Help needed climbing 3-5 steps with a railing? : A Little 6 Click Score: 22    End of Session Equipment Utilized During Treatment: Gait belt Activity Tolerance: Patient limited by fatigue Patient left: in bed;with call bell/phone within reach Nurse Communication: Mobility status PT Visit Diagnosis: Unsteadiness on feet (R26.81);Other abnormalities of gait and mobility (R26.89)    Time: 1200-1230 PT Time Calculation (min) (ACUTE ONLY): 30 min   Charges:   PT Evaluation $PT Eval  Low Complexity: 1 Low PT Treatments $Gait Training: 8-22 mins       1:31 PM, 05/13/20 M. Sherlyn Lees, PT, DPT Physical Therapist- [place] Office Number: [phone]

## 2020-05-14 ENCOUNTER — Encounter (HOSPITAL_COMMUNITY): Payer: Self-pay | Admitting: Internal Medicine

## 2020-05-14 ENCOUNTER — Ambulatory Visit (HOSPITAL_COMMUNITY)
Admission: RE | Admit: 2020-05-14 | Discharge: 2020-05-14 | Disposition: A | Discharge status: Home / Self Care | Payer: Medicare HMO | Source: Ambulatory Visit | Attending: Gastroenterology | Admitting: Gastroenterology

## 2020-05-14 ENCOUNTER — Inpatient Hospital Stay (HOSPITAL_COMMUNITY): Payer: Medicare HMO

## 2020-05-14 ENCOUNTER — Telehealth: Payer: Medicare HMO

## 2020-05-14 DIAGNOSIS — R6510 Systemic inflammatory response syndrome (SIRS) of non-infectious origin without acute organ dysfunction: Secondary | ICD-10-CM | POA: Diagnosis not present

## 2020-05-14 LAB — COMPREHENSIVE METABOLIC PANEL
ALT: 58 U/L — ABNORMAL HIGH (ref 0–44)
AST: 127 U/L — ABNORMAL HIGH (ref 15–41)
Albumin: 2.5 g/dL — ABNORMAL LOW (ref 3.5–5.0)
Alkaline Phosphatase: 126 U/L (ref 38–126)
Anion gap: 5 (ref 5–15)
BUN: 12 mg/dL (ref 6–20)
CO2: 21 mmol/L — ABNORMAL LOW (ref 22–32)
Calcium: 7.4 mg/dL — ABNORMAL LOW (ref 8.9–10.3)
Chloride: 108 mmol/L (ref 98–111)
Creatinine, Ser: 0.55 mg/dL (ref 0.44–1.00)
GFR, Estimated: >60 mL/min (ref >60)
Glucose, Bld: 106 mg/dL — ABNORMAL HIGH (ref 70–99)
Potassium: 3.2 mmol/L — ABNORMAL LOW (ref 3.5–5.1)
Sodium: 134 mmol/L — ABNORMAL LOW (ref 135–145)
Total Bilirubin: 1.4 mg/dL — ABNORMAL HIGH (ref 0.3–1.2)
Total Protein: 5.3 g/dL — ABNORMAL LOW (ref 6.5–8.1)

## 2020-05-14 LAB — CBC
HCT: 28.2 % — ABNORMAL LOW (ref 36.0–46.0)
Hemoglobin: 9.4 g/dL — ABNORMAL LOW (ref 12.0–15.0)
MCH: 30.8 pg (ref 26.0–34.0)
MCHC: 33.3 g/dL (ref 30.0–36.0)
MCV: 92.5 fL (ref 80.0–100.0)
Platelets: 171 10*3/uL (ref 150–400)
RBC: 3.05 MIL/uL — ABNORMAL LOW (ref 3.87–5.11)
RDW: 17.2 % — ABNORMAL HIGH (ref 11.5–15.5)
WBC: 10 10*3/uL (ref 4.0–10.5)
nRBC: 0 % (ref 0.0–0.2)

## 2020-05-14 LAB — HEPATITIS PANEL, ACUTE
HCV Ab: NONREACTIVE
Hep A IgM: NONREACTIVE
Hep B C IgM: NONREACTIVE
Hepatitis B Surface Ag: NONREACTIVE

## 2020-05-14 LAB — BODY FLUID CELL COUNT WITH DIFFERENTIAL
Eos, Fluid: 0 %
Lymphs, Fluid: 33 %
Monocyte-Macrophage-Serous Fluid: 48 % — ABNORMAL LOW (ref 50–90)
Neutrophil Count, Fluid: 19 % (ref 0–25)
Other Cells, Fluid: REACTIVE %
Total Nucleated Cell Count, Fluid: 51 cu mm (ref 0–1000)

## 2020-05-14 MED ORDER — CEFAZOLIN SODIUM-DEXTROSE 2-4 GM/100ML-% IV SOLN
2.0000 g | Freq: Three times a day (TID) | INTRAVENOUS | Status: DC
  Administered 2020-05-14 – 2020-05-27 (×39): 2 g via INTRAVENOUS
  Filled 2020-05-14 (×39): qty 100

## 2020-05-14 NOTE — Sedation Documentation (Signed)
PT tolerated right sided paracentesis procedure well today and 20 mL of clear yellow fluid removed with labs collected and sent for processing. PT verbalized understanding of post procedure instructions. PT transported back to inpatient bed assignment via stretcher at this time with vital signs remaining stable.

## 2020-05-14 NOTE — Plan of Care (Signed)

## 2020-05-14 NOTE — Procedures (Signed)
PreOperative Dx: Ascites Postoperative Dx: Ascites Procedure:   US guided diagnostic paracentesis Radiologist:  Thornton Papas Anesthesia:  6 ml of1% lidocaine Specimen:  20 mL of clear yellow ascitic fluid EBL:   None Complications: None

## 2020-05-14 NOTE — Progress Notes (Signed)
Patient is hospitalized at [facility] today. She was scheduled for MRI Abd wo/w today at 12p.  Her order will be placed back into ancillary orders to be rescheduled.  Dr Tarri Glenn is aware.

## 2020-05-14 NOTE — Progress Notes (Signed)
PROGRESS NOTE    Patient: [name]                            PCP: [name], MD                    DOB: [birth year]            DOA: 05/12/2020 TFT:[id number]             DOS: 05/14/2020, 11:27 AM   LOS: 1 day   Date of Service: The patient was seen and examined on 05/14/2020  Subjective:   The patient was seen and examined this morning. Hemodynamically stable. Remains mildly lethargic, but responds and participate in exams, Otherwise no issues overnight .  Blood cultures revealing MSSA  Brief Narrative:   [name]  is a 55 y.o. female, with history of seizures, mood disorder, benign brain tumor, fatty liver, hypercholesterol, and more presents the ED with a chief complaint of fall.  She trippedand fell forward hit her head on the railing and landed on her right chest.  She reports that she hit the back of her head on the way down.  She had no loss of consciousness. Her main complaint at this time is that the right side of her chest hurts.   She reports she had a UTI a month ago and had antibiotics for it and the symptoms cleared up.   She reports nausea and vomiting x3 today prior to coming in on the ambulance.  There is nonbloody emesis.  She has no other complaints at this time.  Patient reports that she does not smoke, does not drink, does not use illicit drugs and is vaccinated for COVID.  Patient is full code.  In the ED Temp 101, improved to 100.3, respiratory rate 18-26, blood pressure 111/70, satting as low as 89% on room air CT abdomen shows intrinsic liver disease, ascites, anasarca, hazy pancreatic stranding, thickened cecum and ascending colon. CT head is without acute changes Chest x-ray shows some basilar lingular atelectasis Negative COVID White blood cell count 12.4, hemoglobin 10.4 Chemistry panel shows a transaminitis and elevated T bili Lactic acid is elevated at 2.2 but improved to 1.8 after 1 L bolus EKG shows sinus tachycardia with a heart  rate of 108, QTc 490 Cefepime Flagyl and Vanco were started in the ED 1 L bolus as mentioned above and then followed by 150 mL/h fluids      Assessment & Plan:   Principal Problem:   SIRS (systemic inflammatory response syndrome) (HCC) Active Problems:   Seizure disorder (HCC)   Elevated liver function tests   Meningioma ([place])   Fall at home, initial encounter   Transaminasemia   Rib injury   Other cirrhosis of liver ([place])   Gastric varices   Fever   1. Met sepsis criteria on admission -POA Bacteremia/UTI 1. During hospital course T-max 102.3, pulse as high as 110, RR 29, blood pressure 99/66,, WBC of 14.4, lactic acid 2.2--source of infection likely UTI-bacteremia 2. Sepsis physiology resolved... Hemodynamically stable now 3. Chest x-ray shows basilar atelectasis which is expected with the rib injury, but no definite infiltrate 4. UA--positive for nitrite, CT abdomen shows no infectious etiology 5. Blood cultures from admission growing Staph aureus, MSSA 6. Reculturing blood cultures today 05/14/2020  7. Urine culture no growth to date 8. consulting infectious disease team 9. Lactic acid improved to 2.2 >>> 1.8  now    10. Cefepime, Flagyl, vancomycin continued..  DC'd 5-22, initiating IV Rocephin 11. 1 L bolus in ED ... Blood pressure borderline low .Marland Kitchen  But stable  12. continue IV fluids 13. Organ compromise liver, elevated LFTs,- monitoring, improving 14. Random cortisol level 17.1  2. Elevated liver function tests 1. History of a steatosis, exacerbated by sepsis fever, nausea vomiting 2. AST 138, 121, 127 3. ALT 66, 60, 58 4. T bili 1.9, 1.5, 1.4, 5. Hepatitis panel nonreactive 6. Checking acute hepatitis panel 7. CT abdomen does show worsening liver disease when compared to the last -at the end of January 2022 8. Avoiding hepatotoxins 9. Abdominal ultrasound: Revealing liver cirrhosis, small amount ascites, sludge and otherwise normal gallbladder  10. Small  ascites, continue to monitor not amendable to paracentesis yet 11. Last right upper quadrant ultrasound was February 23, 2020, and showed no abnormality of the liver gallbladder 12. Continue to monitor  3. Debility/fall at home initial encounter 1. Mechanical fall 2. PT eval and treat  4. Rib injury 1. Secondary to fall 2. Incentive spirometer 3. Pain control  5.  Comorbidities: seizures, mood disorder, benign brain tumor, fatty liver, hypercholesterol,   -Home medication reviewed will be resume accordingly, holding statins due to transaminitis      --------------------------------------------------------------------------------------------------------------------------------------- Nutritional status:  The patient's BMI is: Body mass index is 24.97 kg/m. I agree with the assessment and plan as outlined...     Skin Assessment: I have examined the patient's skin and I agree with the wound assessment as performed by wound care team As outlined -------------------------------------------------------------------------------------------------------------------------------------- Cultures; Blood Cultures x 2>>> growing MSSA, repeating blood cultures today 05/14/2020 Urine Culture  >>> NGT    Antimicrobials: IV antibiotics of vancomycin and cefepime till 5-22, switch to Ancef   Consultants: ID  ---------------------------------------------------------------------------------------------------------------------------------------  DVT prophylaxis:  SCD/Compression stockings and Heparin SQ Code Status:   Code Status: Full Code  Family Communication: Discussed with patient  No family member present at bedside- attempt will be made to update daily The above findings and plan of care has been discussed with patient (and family)  in detail,  they expressed understanding and agreement of above. -Advance care planning has been discussed.   Admission status:   Status is:  Inpatient  Remains inpatient appropriate because:IV treatments appropriate due to intensity of illness or inability to take PO and Inpatient level of care appropriate due to severity of illness   Dispo: The patient is from: Home              Anticipated d/c is to: Home versus home with home health              Patient currently is not medically stable to d/c.  Requiring repeat blood cultures, IV antibiotics,   Difficult to place patient No      Level of care: Telemetry   Procedures:   No admission procedures for hospital encounter.    Antimicrobials:  Anti-infectives (From admission, onward)   Start     Dose/Rate Route Frequency Ordered Stop   05/14/20 1000  ceFAZolin (ANCEF) IVPB 2g/100 mL premix        2 g 200 mL/hr over 30 Minutes Intravenous Every 8 hours 05/14/20 0905     05/13/20 2200  vancomycin (VANCOCIN) IVPB 1000 mg/200 mL premix  Status:  Discontinued        1,000 mg 200 mL/hr over 60 Minutes Intravenous Every 24 hours 05/13/20 0254 05/14/20 0903   05/13/20 0800  metroNIDAZOLE (FLAGYL) IVPB 500 mg  Status:  Discontinued        500 mg 100 mL/hr over 60 Minutes Intravenous Every 8 hours 05/13/20 0241 05/14/20 0904   05/13/20 0800  ceFEPIme (MAXIPIME) 2 g in sodium chloride 0.9 % 100 mL IVPB  Status:  Discontinued        2 g 200 mL/hr over 30 Minutes Intravenous Every 8 hours 05/13/20 0254 05/14/20 0903   05/12/20 2315  aztreonam (AZACTAM) 2 g in sodium chloride 0.9 % 100 mL IVPB  Status:  Discontinued        2 g 200 mL/hr over 30 Minutes Intravenous  Once 05/12/20 2308 05/12/20 2312   05/12/20 2315  metroNIDAZOLE (FLAGYL) IVPB 500 mg        500 mg 100 mL/hr over 60 Minutes Intravenous  Once 05/12/20 2308 05/13/20 0141   05/12/20 2315  vancomycin (VANCOCIN) IVPB 1000 mg/200 mL premix        1,000 mg 200 mL/hr over 60 Minutes Intravenous  Once 05/12/20 2308 05/13/20 0048   05/12/20 2315  ceFEPIme (MAXIPIME) 2 g in sodium chloride 0.9 % 100 mL IVPB        2  g 200 mL/hr over 30 Minutes Intravenous  Once 05/12/20 2312 05/13/20 0048       Medication:  . folic acid  1 mg Oral Daily  . heparin  5,000 Units Subcutaneous Q8H  . lamoTRIgine  150 mg Oral BID  . levETIRAcetam  1,000 mg Oral BID    acetaminophen **OR** acetaminophen, HYDROmorphone (DILAUDID) injection, LORazepam, ondansetron **OR** ondansetron (ZOFRAN) IV   Objective:   Vitals:   05/13/20 1559 05/13/20 2139 05/14/20 0205 05/14/20 0600  BP: 103/73 102/69 109/65 99/66  Pulse: 91 (!) 101 89 98  Resp:  '20 20 20  ' Temp: 98.6 F (37 C) 99 F (37.2 C) 99.2 F (37.3 C) 99.1 F (37.3 C)  TempSrc: Oral   Oral  SpO2: 97% 94% 96% 96%  Weight:      Height:        Intake/Output Summary (Last 24 hours) at 05/14/2020 1127 Last data filed at 05/14/2020 0300 Gross per 24 hour  Intake 2605.69 ml  Output --  Net 2605.69 ml   Filed Weights   05/12/20 2220  Weight: 58 kg     Examination:   Physical Exam  Constitution:  Alert, cooperative, no distress,  Appears calm and comfortable  Psychiatric: Normal and stable mood and affect, cognition intact,   HEENT: Normocephalic, PERRL, otherwise with in Normal limits  Chest:Chest symmetric Cardio vascular:  S1/S2, RRR, No murmure, No Rubs or Gallops  pulmonary: Clear to auscultation bilaterally, respirations unlabored, negative wheezes / crackles Abdomen: Soft, non-tender, non-distended, bowel sounds,no masses, no organomegaly Muscular skeletal: Limited exam - in bed, able to move all 4 extremities, Normal strength,  Neuro: CNII-XII intact. , normal motor and sensation, reflexes intact  Extremities: No pitting edema lower extremities, +2 pulses  Skin: Dry, warm to touch, negative for any Rashes, No open wounds Wounds: per nursing documentation    ------------------------------------------------------------------------------------------------------------------------------------------    LABs:  CBC Latest Ref Rng & Units 05/14/2020  05/13/2020 05/12/2020  WBC 4.0 - 10.5 K/uL 10.0 14.4(H) 12.4(H)  Hemoglobin 12.0 - 15.0 g/dL 9.4(L) 9.7(L) 10.4(L)  Hematocrit 36.0 - 46.0 % 28.2(L) 30.0(L) 30.3(L)  Platelets 150 - 400 K/uL 171 164 214   CMP Latest Ref Rng & Units 05/14/2020 05/13/2020 05/12/2020  Glucose 70 - 99 mg/dL 106(H) 113(H) 109(H)  BUN  6 - 20 mg/dL '12 9 10  ' Creatinine 0.44 - 1.00 mg/dL 0.55 0.53 0.62  Sodium 135 - 145 mmol/L 134(L) 138 134(L)  Potassium 3.5 - 5.1 mmol/L 3.2(L) 3.2(L) 3.7  Chloride 98 - 111 mmol/L 108 109 104  CO2 22 - 32 mmol/L 21(L) 21(L) 21(L)  Calcium 8.9 - 10.3 mg/dL 7.4(L) 7.9(L) 8.6(L)  Total Protein 6.5 - 8.1 g/dL 5.3(L) 5.3(L) 6.0(L)  Total Bilirubin 0.3 - 1.2 mg/dL 1.4(H) 1.5(H) 1.9(H)  Alkaline Phos 38 - 126 U/L 126 140(H) 165(H)  AST 15 - 41 U/L 127(H) 121(H) 138(H)  ALT 0 - 44 U/L 58(H) 60(H) 66(H)       Micro Results Recent Results (from the past 240 hour(s))  Culture, blood (routine x 2)     Status: Abnormal (Preliminary result)   Collection Time: 05/12/20 10:45 PM   Specimen: BLOOD LEFT FOREARM  Result Value Ref Range Status   Specimen Description   Final    BLOOD LEFT FOREARM Performed at [facility], [address], [place], [place] [address]    Special Requests   Final    Blood Culture results may not be optimal due to an excessive volume of blood received in culture bottles BOTTLES DRAWN AEROBIC AND ANAEROBIC Performed at [facility], [address], [place], [place] [address]    Culture  Setup Time   Final    GRAM POSITIVE COCCI BOTH ANAEROBIC AND AEROBIC BOTTLES Gram Stain Report Called to,Read Back By and Verified With: GANTT,E'[email or web address]'  BY MATTHEWS, B 5.1.22 Organism ID to follow Performed at [facility], [place] [address], [place], [place] [address]    Culture STAPHYLOCOCCUS AUREUS (A)  Final   Report Status PENDING  Incomplete  Blood Culture ID Panel (Reflexed)     Status: Abnormal   Collection Time: 05/12/20 10:45 PM  Result Value Ref Range Status    Enterococcus faecalis NOT DETECTED NOT DETECTED Final   Enterococcus Faecium NOT DETECTED NOT DETECTED Final   Listeria monocytogenes NOT DETECTED NOT DETECTED Final   Staphylococcus species DETECTED (A) NOT DETECTED Final    Comment: CRITICAL RESULT CALLED TO, READ BACK BY AND VERIFIED WITH: NACKSON ONDARA,RN AT 2132 05/13/2020 A.HUGHES    Staphylococcus aureus (BCID) DETECTED (A) NOT DETECTED Final    Comment: CRITICAL RESULT CALLED TO, READ BACK BY AND VERIFIED WITH: Valentino Hue, RN AT  2132 05/13/2020 A.HUGHES    Staphylococcus epidermidis NOT DETECTED NOT DETECTED Final   Staphylococcus lugdunensis NOT DETECTED NOT DETECTED Final   Streptococcus species NOT DETECTED NOT DETECTED Final   Streptococcus agalactiae NOT DETECTED NOT DETECTED Final   Streptococcus pneumoniae NOT DETECTED NOT DETECTED Final   Streptococcus pyogenes NOT DETECTED NOT DETECTED Final   A.calcoaceticus-baumannii NOT DETECTED NOT DETECTED Final   Bacteroides fragilis NOT DETECTED NOT DETECTED Final   Enterobacterales NOT DETECTED NOT DETECTED Final   Enterobacter cloacae complex NOT DETECTED NOT DETECTED Final   Escherichia coli NOT DETECTED NOT DETECTED Final   Klebsiella aerogenes NOT DETECTED NOT DETECTED Final   Klebsiella oxytoca NOT DETECTED NOT DETECTED Final   Klebsiella pneumoniae NOT DETECTED NOT DETECTED Final   Proteus species NOT DETECTED NOT DETECTED Final   Salmonella species NOT DETECTED NOT DETECTED Final   Serratia marcescens NOT DETECTED NOT DETECTED Final   Haemophilus influenzae NOT DETECTED NOT DETECTED Final   Neisseria meningitidis NOT DETECTED NOT DETECTED Final   Pseudomonas aeruginosa NOT DETECTED NOT DETECTED Final   Stenotrophomonas maltophilia NOT DETECTED NOT DETECTED Final  Candida albicans NOT DETECTED NOT DETECTED Final   Candida auris NOT DETECTED NOT DETECTED Final   Candida glabrata NOT DETECTED NOT DETECTED Final   Candida krusei NOT DETECTED NOT DETECTED Final    Candida parapsilosis NOT DETECTED NOT DETECTED Final   Candida tropicalis NOT DETECTED NOT DETECTED Final   Cryptococcus neoformans/gattii NOT DETECTED NOT DETECTED Final   Meth resistant mecA/C and MREJ NOT DETECTED NOT DETECTED Final    Comment: Performed at [facility], [place] [address], [place], [place] [address]  Resp Panel by RT-PCR (Flu A&B, Covid) Nasopharyngeal Swab     Status: None   Collection Time: 05/12/20 10:46 PM   Specimen: Nasopharyngeal Swab; Nasopharyngeal(NP) swabs in vial transport medium  Result Value Ref Range Status   SARS Coronavirus 2 by RT PCR NEGATIVE NEGATIVE Final    Comment: (NOTE) SARS-CoV-2 target nucleic acids are NOT DETECTED.  The SARS-CoV-2 RNA is generally detectable in upper respiratory specimens during the acute phase of infection. The lowest concentration of SARS-CoV-2 viral copies this assay can detect is 138 copies/mL. A negative result does not preclude SARS-Cov-2 infection and should not be used as the sole basis for treatment or other patient management decisions. A negative result may occur with  improper specimen collection/handling, submission of specimen other than nasopharyngeal swab, presence of viral mutation(s) within the areas targeted by this assay, and inadequate number of viral copies(<138 copies/mL). A negative result must be combined with clinical observations, patient history, and epidemiological information. The expected result is Negative.  Fact Sheet for Patients:  EntrepreneurPulse.com.au  Fact Sheet for Healthcare Providers:  IncredibleEmployment.be  This test is no t yet approved or cleared by the [place] FDA and  has been authorized for detection and/or diagnosis of SARS-CoV-2 by FDA under an Emergency Use Authorization (EUA). This EUA will remain  in effect (meaning this test can be used) for the duration of the COVID-19 declaration under Section 564(b)(1) of the Act,  21 U.S.C.section 360bbb-3(b)(1), unless the authorization is terminated  or revoked sooner.       Influenza A by PCR NEGATIVE NEGATIVE Final   Influenza B by PCR NEGATIVE NEGATIVE Final    Comment: (NOTE) The Xpert Xpress SARS-CoV-2/FLU/RSV plus assay is intended as an aid in the diagnosis of influenza from Nasopharyngeal swab specimens and should not be used as a sole basis for treatment. Nasal washings and aspirates are unacceptable for Xpert Xpress SARS-CoV-2/FLU/RSV testing.  Fact Sheet for Patients: EntrepreneurPulse.com.au  Fact Sheet for Healthcare Providers: IncredibleEmployment.be  This test is not yet approved or cleared by the [place] FDA and has been authorized for detection and/or diagnosis of SARS-CoV-2 by FDA under an Emergency Use Authorization (EUA). This EUA will remain in effect (meaning this test can be used) for the duration of the COVID-19 declaration under Section 564(b)(1) of the Act, 21 U.S.C. section 360bbb-3(b)(1), unless the authorization is terminated or revoked.  Performed at [facility], [address], [place], [place] [address]   Culture, blood (routine x 2)     Status: Abnormal (Preliminary result)   Collection Time: 05/12/20 11:10 PM   Specimen: BLOOD RIGHT HAND  Result Value Ref Range Status   Specimen Description   Final    BLOOD RIGHT HAND Performed at [facility], [address] [place], [place] [address]    Special Requests   Final    Blood Culture adequate volume BOTTLES DRAWN AEROBIC AND ANAEROBIC Performed at [facility], [address] [place],  [place] [address]    Culture  Setup Time   Final    GRAM POSITIVE COCCI ANAEROBIC AND AEROBIC BOTTLES Gram Stain Report Called to,Read Back By and Verified With: GANTT,E'[email or web address]'  BY MATTHEWS B 5.1.22 CRITICAL VALUE NOTED.  VALUE IS CONSISTENT WITH PREVIOUSLY REPORTED AND CALLED VALUE. Performed at [facility], [place] [address],  [place], [place] [address]    Culture STAPHYLOCOCCUS AUREUS (A)  Final   Report Status PENDING  Incomplete  Culture, blood (single)     Status: None (Preliminary result)   Collection Time: 05/14/20 10:08 AM   Specimen: BLOOD  Result Value Ref Range Status   Specimen Description BLOOD BLOOD LEFT HAND  Final   Special Requests   Final    Blood Culture results may not be optimal due to an inadequate volume of blood received in culture bottles BOTTLES DRAWN AEROBIC AND ANAEROBIC Performed at [facility], [address] [place], [place] [address]    Culture PENDING  Incomplete   Report Status PENDING  Incomplete    Radiology Reports CT HEAD WO CONTRAST  Result Date: 05/13/2020 CLINICAL DATA:  Fall 2 days prior EXAM: CT HEAD WITHOUT CONTRAST TECHNIQUE: Contiguous axial images were obtained from the base of the skull through the vertex without intravenous contrast. COMPARISON:  CT 02/20/2020 FINDINGS: Brain: No evidence of acute infarction, hemorrhage, hydrocephalus, extra-axial collection, visible mass lesion or mass effect. Vascular: Atherosclerotic calcification of the carotid siphons. No hyperdense vessel. Skull: No calvarial fracture or suspicious osseous lesion. No scalp swelling or hematoma. Sinuses/Orbits: Paranasal sinuses are predominantly clear. Mastoid air cells appear chronically hypo pneumatized. Middle ear cavities are clear. Debris in the bilateral external auditory canals. Included orbital structures are unremarkable. Other: Asymmetric thickening and calcification in the left parotid gland is a chronic, nonspecific finding, similar to comparison study. IMPRESSION: No acute intracranial abnormality. No calvarial fracture, significant scalp swelling or hematoma. Intracranial atherosclerosis. Asymmetric thickening and calcification in the left parotid gland, a nonspecific, chronic finding unchanged from prior. Can reflect sequela of a chronic inflammatory process. Debris in the external auditory  canals, correlate for cerumen impaction. Electronically Signed   By: Lovena Le M.D.   On: 05/13/2020 00:44   CT ABDOMEN PELVIS W CONTRAST  Result Date: 05/13/2020 CLINICAL DATA:  Abdominal pain, fever, right upper quadrant pain EXAM: CT ABDOMEN AND PELVIS WITH CONTRAST TECHNIQUE: Multidetector CT imaging of the abdomen and pelvis was performed using the standard protocol following bolus administration of intravenous contrast. CONTRAST:  147m OMNIPAQUE IOHEXOL 300 MG/ML  SOLN COMPARISON:  CT 02/12/2020 FINDINGS: Lower chest: Basilar atelectatic changes in the lungs including more bandlike areas of subsegmental atelectasis or scarring. Normal heart size. No pericardial effusion. Hepatobiliary: Heterogeneous hepatic attenuation with a nodular liver surface contour concerning for intrinsic liver disease/cirrhosis. No concerning focal liver lesion. Moderate gallbladder distension. No significant gallbladder wall thickening or pericholecystic inflammation is seen. No visible calcified gallstones or biliary ductal dilatation. Pancreas: Slightly edematous appearance of the pancreatic parenchyma with very faint peripancreatic haze, uniform enhancement. No pancreatic ductal dilatation. Spleen: Mild splenomegaly.  No concerning focal splenic lesion. Adrenals/Urinary Tract: Normal adrenals. Kidneys enhance symmetrically. Symmetric and uniform excretion. No concerning focal renal lesion. Bilateral renal cortical scarring. Nonobstructing calculi present in both kidneys. No obstructive urolithiasis or hydronephrosis is seen at this time. Bladder is unremarkable for the degree of distention. Stomach/Bowel: Paraesophageal venous collaterals and varices. Some minimal collateralization and varices about the gastric fundus as well. Thickening towards the gastric antrum likely reflecting  normal peristalsis. Air and fluid-filled appearance of the duodenum with a normal sweep across the midline abdomen. Slightly edematous  appearance diffusely through the small bowel is nonspecific given additional findings above. Moderate right-sided colonic stool burden, particularly within the cecum. Some mild circumferential thickening and mucosal hyperemia about the cecum and ascending colon is nonspecific given hepatic features. No evidence of mechanical obstruction. Appendix is not well visualized. Vascular/Lymphatic: Atherosclerotic calcifications within the abdominal aorta and branch vessels. No aneurysm or ectasia. Paraesophageal and gastric varices with venous collaterals, as above. Splenorenal collateralization. Upper abdominal varices.No enlarged abdominopelvic lymph nodes. Few scattered calcified lymph nodes are present in the abdomen and pelvis. Reproductive: Portion the pelvis obscured by streak artifact. 2.6 cm cystic focus in the left adnexa, unchanged from comparison. No concerning right adnexal lesion. Prior hysterectomy. Other: Increasing edematous changes of the central mesentery. Small volume low-attenuation fluid in the deep pelvis as well as trace perihepatic ascites. Circumferential body wall edema. No bowel containing hernia. No free abdominopelvic air. Musculoskeletal: Multilevel degenerative changes are present in the imaged portions of the spine. Grade 1 anterolisthesis L4 on L5 without spondylolysis, unchanged from prior. Prior left hip arthroplasty with chronic bony remodeling and a similar appearance overall to comparison prior. IMPRESSION: Constellation of features suggestive of intrinsic liver disease/cirrhosis with features of portal hypertension including a nodular, heterogeneous liver with paraesophageal and gastric varices, additional upper abdominal venous collateralization, splenorenal shunting and splenomegaly. Interval development of a small volume ascites as well as additional features of at least mild or early developing anasarca with circumferential body wall edema and central mesenteric edema. Some hazy  peripancreatic stranding may be present, possibly related to a diffusely edematous appearance of the mesentery though could correlate with lipase. Mildly thickened appearance of the cecum and ascending colon as well as a diffusely edematous appearance of the small bowel is nonspecific in the setting of suspected intrinsic liver disease. Could reflect portal enteropathy/colopathy though should exclude clinical symptoms of enterocolitis. Bilateral nonobstructing nephrolithiasis. No obstructive urolithiasis or hydronephrosis at this time. Bilateral renal cortical scarring. Prior hysterectomy. Stable 2.7 cm left ovarian cyst. Continued stability favoring a benign process. No follow-up imaging recommended. Note: This recommendation does not apply to premenarchal patients and to those with increased risk (genetic, family history, elevated tumor markers or other high-risk factors) of ovarian cancer. Reference: JACR 2020 Feb; 17(2):248-254 Aortic Atherosclerosis (ICD10-I70.0). Electronically Signed   By: Lovena Le M.D.   On: 05/13/2020 00:35   US Abdomen Limited  Result Date: 05/13/2020 CLINICAL DATA:  Right upper quadrant pain. EXAM: ULTRASOUND ABDOMEN LIMITED RIGHT UPPER QUADRANT COMPARISON:  February 23, 2020 ultrasound.  CT scan May 12, 2020. FINDINGS: Gallbladder: A small amount of sludge is seen in the gallbladder. No stones, Murphy's sign, or wall thickening. A small amount of ascites is identified, including adjacent to the gallbladder. Common bile duct: Diameter: 5 mm Liver: There is a nodular contour in the liver. No focal mass. Portal vein is patent on color Doppler imaging with normal direction of blood flow towards the liver. Other: Mild ascites. IMPRESSION: 1. Nodular contour to the liver suggesting cirrhosis.  Mild ascites. 2. Mild sludge in otherwise normal appearing gallbladder. A small amount of fluid adjacent to the gallbladder is likely due to the ascites. Electronically Signed   By: Dorise Bullion III M.D   On: 05/13/2020 09:06   DG Chest Portable 1 View  Result Date: 05/12/2020 CLINICAL DATA:  Right sided rib pain. EXAM: PORTABLE CHEST 1 VIEW  COMPARISON:  February 12, 2020 FINDINGS: A very mild amount of linear atelectasis is seen within the right lung base. There is no evidence of acute infiltrate, pleural effusion or pneumothorax. The heart size and mediastinal contours are within normal limits. A chronic seventh right rib fracture is seen. IMPRESSION: 1. Very mild right basilar linear atelectasis. 2. Chronic seventh right rib fracture. Electronically Signed   By: Virgina Norfolk M.D.   On: 05/12/2020 23:16   US ABDOMEN COMPLETE W/ELASTOGRAPHY  Result Date: 05/03/2020 CLINICAL DATA:  Fatty liver EXAM: ULTRASOUND ABDOMEN ULTRASOUND HEPATIC ELASTOGRAPHY TECHNIQUE: Sonography of the upper abdomen was performed. In addition, ultrasound elastography evaluation of the liver was performed. A region of interest was placed within the right lobe of the liver. Following application of a compressive sonographic pulse, tissue compressibility was assessed. Multiple assessments were performed at the selected site. Median tissue compressibility was determined. Previously, hepatic stiffness was assessed by shear wave velocity. Based on recently published Society of Radiologists in Ultrasound consensus article, reporting is now recommended to be performed in the SI units of pressure (kiloPascals) representing hepatic stiffness/elasticity. The obtained result is compared to the published reference standards. (cACLD = compensated Advanced Chronic Liver Disease) COMPARISON:  02/23/2020 FINDINGS: ULTRASOUND ABDOMEN Gallbladder: Gallbladder partially contracted without evidence of gallstones or sonographic Murphy sign. Small amount of internal sludge is present. No wall thickening. Common bile duct: Diameter: 6 mm upper normal Liver: Echogenic hepatic parenchyma with nodular contour consistent with cirrhosis.  No discrete hepatic mass. Portal vein is patent on color Doppler imaging with normal direction of blood flow towards the liver. IVC: Normal appearance Pancreas: Normal appearance Spleen: 11.9 cm length, calculated volume 399 mL.  No focal mass. Right Kidney: Length: 11.9 cm. Normal morphology without mass or hydronephrosis. Left Kidney: Length: 12.0 cm. Normal morphology without mass or hydronephrosis. Abdominal aorta: Normal caliber Other findings: Scattered ascites. ULTRASOUND HEPATIC ELASTOGRAPHY Device: Siemens Helix VTQ Patient position: Not recorded Transducer 5C1 Number of measurements: 10 Hepatic segment:  8 Median kPa: 95.8 IQR: 11.6 IQR/Median kPa ratio: 0.1 Data quality:  Good Diagnostic category: > or =17 kPa: highly suggestive of cACLD with an increased probability of clinically significant portal hypertension The use of hepatic elastography is applicable to patients with viral hepatitis and non-alcoholic fatty liver disease. At this time, there is insufficient data for the referenced cut-off values and use in other causes of liver disease, including alcoholic liver disease. Patients, however, may be assessed by elastography and serve as their own reference standard/baseline. In patients with non-alcoholic liver disease, the values suggesting compensated advanced chronic liver disease (cACLD) may be lower, and patients may need additional testing with elasticity results of 7-9 kPa. Please note that abnormal hepatic elasticity and shear wave velocities may also be identified in clinical settings other than with hepatic fibrosis, such as: acute hepatitis, elevated right heart and central venous pressures including use of beta blockers, veno-occlusive disease (Budd-Chiari), infiltrative processes such as mastocytosis/amyloidosis/infiltrative tumor/lymphoma, extrahepatic cholestasis, with hyperemia in the post-prandial state, and with liver transplantation. Correlation with patient history, laboratory data,  and clinical condition recommended. Diagnostic Categories: < or =5 kPa: high probability of being normal < or =9 kPa: in the absence of other known clinical signs, rules out cACLD >9 kPa and ?13 kPa: suggestive of cACLD, but needs further testing >13 kPa: highly suggestive of cACLD > or =17 kPa: highly suggestive of cACLD with an increased probability of clinically significant portal hypertension IMPRESSION: ULTRASOUND ABDOMEN: Cirrhotic appearing liver without focal  mass. ULTRASOUND HEPATIC ELASTOGRAPHY: Median kPa:  95.8 Diagnostic category: > or =17 kPa: highly suggestive of cACLD with an increased probability of clinically significant portal hypertension Electronically Signed   By: Lavonia Dana M.D.   On: 05/03/2020 17:09   [name] ASCITES (ABDOMEN LIMITED)  Result Date: 05/14/2020 CLINICAL DATA:  Ascites, question sufficient for paracentesis, clinical concern for spontaneous bacterial peritonitis EXAM: LIMITED ABDOMEN ULTRASOUND FOR ASCITES TECHNIQUE: Limited ultrasound survey for ascites was performed in all four abdominal quadrants. COMPARISON:  CT abdomen and pelvis 05/13/2020 FINDINGS: Small volume ascites identified in the lower quadrants bilaterally. Volume of ascites has probably slightly increased since prior CT. While the volume of fluid is insufficient for therapeutic paracentesis, a small volume of ascites may be obtainable for diagnostic purposes. Diagnostic paracentesis will be attempted. IMPRESSION: Small volume ascites, likely sufficient volume for diagnostic paracentesis. Electronically Signed   By: Lavonia Dana M.D.   On: 05/14/2020 11:11    SIGNED: Deatra James, MD, FHM. [facility],  Pager (please use [email or web address] to page/text) Please use Epic Secure Chat for non-urgent communication (7AM-7PM)  If 7PM-7AM, please contact night-coverage [email or web address], 05/14/2020, 11:27 AM

## 2020-05-14 NOTE — Consult Note (Signed)
ID/asp virtual note   Cc: mssa bacteremia  55 yo female with seizure, fatty liver, admitted 4/30 after a fall (tripped on something) and new onset fever the day of admission, 3 days nausea/vomiting, found to have fever in the ED and MSSA bsi  A month ago treated for uti  No ivdu No recent medical procedures  Empiric vanc/cefepime/flagyl  Has new ascites on abd ct s/p underwent paracentesis 5/02; cell count analysis in progress  Chart no uti sx at this time   Labs/vitals reviewed  lft up; inr 1.4 Initial wbc 14   Micro: 5/02 ascites fluid cx in progress 5/02 bcx in progress 4/30 bcx mssa  Imaging: 5/02 Ct abd/pelv Constellation of features suggestive of intrinsic liver disease/cirrhosis with features of portal hypertension including a nodular, heterogeneous liver with paraesophageal and gastric varices, additional upper abdominal venous collateralization, splenorenal shunting and splenomegaly. Interval development of a small volume ascites as well as additional features of at least mild or early developing anasarca with circumferential body wall edema and central mesenteric edema.  Some hazy peripancreatic stranding may be present, possibly related to a diffusely edematous appearance of the mesentery though could correlate with lipase.  Mildly thickened appearance of the cecum and ascending colon as well as a diffusely edematous appearance of the small bowel is nonspecific in the setting of suspected intrinsic liver disease. Could reflect portal enteropathy/colopathy though should exclude clinical symptoms of enterocolitis.  Bilateral nonobstructing nephrolithiasis. No obstructive urolithiasis or hydronephrosis at this time. Bilateral renal cortical scarring.  Prior hysterectomy. Stable 2.7 cm left ovarian cyst. Continued stability favoring a benign process. No follow-up imaging recommended. Note: This recommendation does not apply to premenarchal patients  and to those with increased risk (genetic, family history, elevated tumor markers or other high-risk factors) of ovarian cancer.    4/30 Chest xray basilar linguilar atelectasis; chronic 7th rib fx   A/p: Community acquired mssa bacteremia sx 3 days at least? Newly dxed cirrhosis Ascites Nephrolithiasis   This is community acquired bacteremia that is very high burden, and in setting of cirrhosis. At least 4 weeks abx treatment will be needed. Tee to r/o endocarditis in which case 6 weeks tx   -please get tte --> tee -f/u repeat blood cx and repeat until 48-72 hours sterilized -no picc until bcx remains persistently (48-72 hours) negative -change antibiotics to cefazolin 2 gram iv q8hours -f/u ascite fluid culture -Look for other metastatic focus (mri any tender midline spine or peripheral joint that is painful/swollen) -Remove any old central catheter. If presence of other hardware will need to r/o involvement there  -once all w/u done  4 weeks iv antibiotics if tee negative and no metastatic focus of infection 6 weeks iv abx if tee is positive or if osseous involvement or if abscesses (the latter will also need I&D or drainage)

## 2020-05-14 NOTE — Discharge Instructions (Signed)
Paracentesis, Care After This sheet gives you information about how to care for yourself after your procedure. Your health care provider may also give you more specific instructions. If you have problems or questions, contact your health care provider. What can I expect after the procedure? After the procedure, it is common to have a small amount of clear fluid coming from the puncture site. Follow these instructions at home: Puncture site care  Follow instructions from your health care provider about how to take care of your puncture site. Make sure you: ? Wash your hands with soap and water before and after you change your bandage (dressing). If soap and water are not available, use hand sanitizer. ? Change your dressing as told by your health care provider.  Check your puncture area every day for signs of infection. Check for: ? Redness, swelling, or pain. ? More fluid or blood. ? Warmth. ? Pus or a bad smell.   General instructions  Return to your normal activities as told by your health care provider. Ask your health care provider what activities are safe for you.  Take over-the-counter and prescription medicines only as told by your health care provider.  Do not take baths, swim, or use a hot tub until your health care provider approves. Ask your health care provider if you may take showers. You may only be allowed to take sponge baths.  Keep all follow-up visits as told by your health care provider. This is important. Contact a health care provider if:  You have redness, swelling, or pain at your puncture site.  You have more fluid or blood coming from your puncture site.  Your puncture site feels warm to the touch.  You have pus or a bad smell coming from your puncture site.  You have a fever. Get help right away if:  You have chest pain or shortness of breath.  You develop increasing pain, discomfort, or swelling in your abdomen.  You feel dizzy or light-headed or  you faint. Summary  After the procedure, it is common to have a small amount of clear fluid coming from the puncture site.  Follow instructions from your health care provider about how to take care of your puncture site.  Check your puncture area every day signs of infection.  Keep all follow-up visits as told by your health care provider. This information is not intended to replace advice given to you by your health care provider. Make sure you discuss any questions you have with your health care provider. Document Revised: 07/13/2018 Document Reviewed: 10/20/2017 Elsevier Patient Education  2021 Elsevier Inc.  

## 2020-05-14 NOTE — TOC Initial Note (Signed)
Transition of Care [facility]) - Initial/Assessment Note    Patient Details  Name: [name] MRN: [id number] Date of Birth: 1965/03/10  [facility] CM/SW Contact:    Boneta Lucks, RN Phone Number: 05/14/2020, 2:52 PM  Clinical Narrative:        Patient admitted with SIRS, needing IV antibiotics. Ordering repeat cultures, need a few more days.  [place] with [facility] is following for HHPT/RN. Carolynn Sayers is following for IV abx needs. TOC to follow.      Expected Discharge Plan: [place] Barriers to Discharge: Continued Medical Work up   Patient Goals and CMS Choice Patient states their goals for this hospitalization and ongoing recovery are:: to go home. CMS Medicare.gov Compare Post Acute Care list provided to:: Patient    Expected Discharge Plan and Services Expected Discharge Plan: [place]: RN,PT [facility] Agency: [place] Date [place]: 05/14/20 Time [place]: [place] Representative spoke with at [place]: [place] Arrangements/Services      Do you feel safe going back to the place where you live?: Yes      Need for Family Participation in Patient Care: Yes (Comment) Care giver support system in place?: Yes (comment)   Criminal Activity/Legal Involvement Pertinent to Current Situation/Hospitalization: No - Comment as needed  Activities of Daily Living Home Assistive Devices/Equipment: Walker (specify type),Shower chair without back ADL Screening (condition at time of admission) Patient's cognitive ability adequate to safely complete daily activities?: Yes Is the patient deaf or have difficulty hearing?: No Does the patient have difficulty seeing, even when wearing glasses/contacts?: No Does the patient have difficulty concentrating, remembering, or making decisions?: No Patient able to express need for assistance with ADLs?: Yes Does the patient have  difficulty dressing or bathing?: No Independently performs ADLs?: Yes (appropriate for developmental age) Does the patient have difficulty walking or climbing stairs?: No Weakness of Legs: None Weakness of Arms/Hands: None  Permission Sought/Granted       Emotional Assessment      Alcohol / Substance Use: Not Applicable Psych Involvement: No (comment)  Admission diagnosis:  Liver disease [K76.9] Fever [R50.9] Right upper quadrant abdominal pain [R10.11] Ascites [R18.8] Fever in adult [R50.9] Patient Active Problem List   Diagnosis Date Noted  . Fever in adult 05/13/2020  . SIRS (systemic inflammatory response syndrome) ([place]) 05/13/2020  . Rib injury 05/13/2020  . Other cirrhosis of liver ([place]) 05/13/2020  . 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 Fever 05/13/2020  . Memory loss   . Transaminasemia   . Visual hallucination   . Generalized weakness 02/21/2020  . Fall at home, initial encounter 02/21/2020  . Hepatic encephalopathy ([place]) 02/21/2020  . Acute metabolic encephalopathy 26/37/8588  . Hyperammonemia ([place]) 02/13/2020  . Meningioma ([place]) 09/12/2019  . Benign neoplasm of cerebral meninges ([place]) 09/09/2019  . Anxiety 12/30/2018  . 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 Widowed - July 2019 08/07/2017  . Sleep apnea 06/09/2017  . Colon polyp 06/05/2017  . Elevated liver function tests 06/05/2017  . Fatty liver   . Mild neurocognitive disorder 02/04/2017  . Seizure ([place]) 11/14/2016  . CAP (community acquired pneumonia) 11/09/2016  . 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 Lobar pneumonia ([place]) 11/28/2015  . Partial symptomatic epilepsy with complex partial seizures, not intractable, with status epilepticus ([place]) 07/24/2015  . OA (osteoarthritis) of knee 05/07/2015  . 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  Seizure disorder ([place]) 07/17/2011   PCP:  [name], MD Pharmacy:   [facility] [address] [place] [address] Phone: [phone] Fax: [phone]    Readmission Risk Interventions Readmission Risk Prevention Plan 05/14/2020 02/24/2020 02/14/2020  Medication Screening - Complete Complete  Transportation Screening Complete Complete Complete  Home Care Screening Complete - -  Medication Review (RN CM) Complete - -  Some recent data might be hidden

## 2020-05-14 NOTE — Progress Notes (Signed)
Ambulated patient on room air, O2 sat 89% when sitting in chair but decreased to 86% while ambulating with walker. Patient c/o mild SOB, stating "it comes and goes." 2L applied and O2 sat increased to 96%. Dr. Roger Shelter made aware, will continue to monitor.

## 2020-05-15 ENCOUNTER — Inpatient Hospital Stay (HOSPITAL_COMMUNITY): Payer: Medicare HMO

## 2020-05-15 DIAGNOSIS — R7881 Bacteremia: Secondary | ICD-10-CM

## 2020-05-15 DIAGNOSIS — R6510 Systemic inflammatory response syndrome (SIRS) of non-infectious origin without acute organ dysfunction: Secondary | ICD-10-CM | POA: Diagnosis not present

## 2020-05-15 LAB — CULTURE, BLOOD (ROUTINE X 2): Special Requests: ADEQUATE

## 2020-05-15 LAB — ECHOCARDIOGRAM COMPLETE
AR max vel: 2.4 cm2
AV Area VTI: 2.13 cm2
AV Area mean vel: 2 cm2
AV Mean grad: 5.1 mmHg
AV Peak grad: 8.2 mmHg
Ao pk vel: 1.43 m/s
Area-P 1/2: 3.97 cm2
Height: 60 in
S' Lateral: 3.1 cm
Weight: 2045.87 oz

## 2020-05-15 LAB — PATHOLOGIST SMEAR REVIEW

## 2020-05-15 LAB — LACTIC ACID, PLASMA: Lactic Acid, Venous: 1.4 mmol/L (ref 0.5–1.9)

## 2020-05-15 MED ORDER — POTASSIUM CHLORIDE CRYS ER 20 MEQ PO TBCR
40.0000 meq | EXTENDED_RELEASE_TABLET | Freq: Once | ORAL | Status: AC
  Administered 2020-05-15: 40 meq via ORAL
  Filled 2020-05-15: qty 2

## 2020-05-15 MED ORDER — LACTATED RINGERS IV BOLUS
1000.0000 mL | Freq: Once | INTRAVENOUS | Status: AC
  Administered 2020-05-15: 1000 mL via INTRAVENOUS

## 2020-05-15 MED ORDER — GUAIFENESIN 100 MG/5ML PO SOLN
5.0000 mL | ORAL | Status: DC | PRN
  Administered 2020-05-16 – 2020-05-21 (×2): 100 mg via ORAL
  Filled 2020-05-15 (×2): qty 5

## 2020-05-15 MED ORDER — FLORANEX PO PACK
1.0000 g | PACK | Freq: Three times a day (TID) | ORAL | Status: DC
  Administered 2020-05-15 – 2020-05-27 (×30): 1 g via ORAL
  Filled 2020-05-15 (×41): qty 1

## 2020-05-15 MED ORDER — LACTATED RINGERS IV SOLN: INTRAVENOUS | Status: DC

## 2020-05-15 MED ORDER — MAGNESIUM SULFATE 2 GM/50ML IV SOLN
2.0000 g | Freq: Once | INTRAVENOUS | Status: AC
  Administered 2020-05-15: 2 g via INTRAVENOUS
  Filled 2020-05-15: qty 50

## 2020-05-15 NOTE — Progress Notes (Signed)
PROGRESS NOTE    Patient: [name]                            PCP: [name], MD                    DOB: 1965-11-17            DOA: 05/12/2020 FOY:[id number]             DOS: 05/15/2020, 1:36 PM   LOS: 2 days   Date of Service: The patient was seen and examined on 05/15/2020  Subjective:   The patient was seen and examined this morning. Hemodynamically stable. Remains mildly lethargic, but responds and participate in exams, Otherwise no issues overnight .  Blood cultures revealing MSSA  Brief Narrative:   Simrah Chatham  is a 55 y.o. female, with history of seizures, mood disorder, benign brain tumor, fatty liver, hypercholesterol, and more presents the ED with a chief complaint of fall.  She trippedand fell forward hit her head on the railing and landed on her right chest.  She reports that she hit the back of her head on the way down.  She had no loss of consciousness. Her main complaint at this time is that the right side of her chest hurts.   She reports she had a UTI a month ago and had antibiotics for it and the symptoms cleared up.   She reports nausea and vomiting x3 today prior to coming in on the ambulance.  There is nonbloody emesis.  She has no other complaints at this time.  Patient reports that she does not smoke, does not drink, does not use illicit drugs and is vaccinated for COVID.  Patient is full code.  In the ED Temp 101, improved to 100.3, respiratory rate 18-26, blood pressure 111/70, satting as low as 89% on room air CT abdomen shows intrinsic liver disease, ascites, anasarca, hazy pancreatic stranding, thickened cecum and ascending colon. CT head is without acute changes Chest x-ray shows some basilar lingular atelectasis Negative COVID White blood cell count 12.4, hemoglobin 10.4 Chemistry panel shows a transaminitis and elevated T bili Lactic acid is elevated at 2.2 but improved to 1.8 after 1 L bolus EKG shows sinus tachycardia with a heart  rate of 108, QTc 490 Cefepime Flagyl and Vanco were started in the ED 1 L bolus as mentioned above and then followed by 150 mL/h fluids      Assessment & Plan:   Principal Problem:   SIRS (systemic inflammatory response syndrome) (HCC) Active Problems:   Seizure disorder (HCC)   Elevated liver function tests   Meningioma ([place])   Fall at home, initial encounter   Transaminasemia   Rib injury   Other cirrhosis of liver ([place])   Gastric varices   Fever   1. Met sepsis criteria on admission -POA MSSA bacteremia/UTI 1. Much improved sepsis physiology, afebrile, mildly hypertensive 2. During hospital course T-max 102.3, pulse as high as 110, RR 29, blood pressure 99/66,, WBC of 14.4, lactic acid 2.2--source of infection likely UTI-bacteremia 3. Resolved sepsis physiology 4. Chest x-ray shows basilar atelectasis which is expected with the rib injury, but no definite infiltrate 5. UA--positive for nitrite, CT abdomen shows no infectious etiology 6. Blood cultures from admission growing Staph aureus, MSSA  7. Reculturing blood cultures today 05/14/2020  8. Urine culture no growth to date 9. consulting infectious disease team... Recommended  TEE (if negative will need 4 weeks of antibiotics, if +6 weeks of IV antibiotics) 10. Lactic acid improved to 2.2 >>> 1.8    11. Cefepime, Flagyl, vancomycin continued..  DC'd 5-22, initiating IV Ancef 12. 1 L bolus in ED ... Blood pressure borderline low .Marland Kitchen  But stable  13. continue IV fluids 14. Organ compromise liver, elevated LFTs,- monitoring, improving 15. Random cortisol level 17.1 16. Consulted cardiology for TEE  2. Elevated liver function tests/ascites - [name] finding of nodularity, cirrhosis 1. History of a steatosis, exacerbated by sepsis fever, nausea vomiting 2. AST 138, 121, 127 3. ALT 66, 60, 58 4. T bili 1.9, 1.5, 1.4, 5. Hepatitis panel nonreactive 6. Checking acute hepatitis panel 7. CT abdomen does show worsening liver  disease when compared to the last -at the end of January 2022 8. Avoiding hepatotoxins 9. Abdominal ultrasound: Revealing liver cirrhosis, small amount ascites, sludge and otherwise normal gallbladder  10. Small ascites, continue to monitor not amendable to paracentesis yet 11. Last right upper quadrant ultrasound was February 23, 2020, and showed no abnormality of the liver gallbladder 12. Continue to monitor Status post paracentesis: 05/14/2020 IMPRESSION:  Successful ultrasound-guided paracentesis yielding 20 mL of 13. peritoneal fluid for diagnostic testing.  3. Debility/fall at home initial encounter 1. Mechanical fall 2. PT eval and treat  4. Rib injury 1. Secondary to fall 2. Incentive spirometer 3. Pain control  5.  Comorbidities: seizures, mood disorder, benign brain tumor, fatty liver, hypercholesterol,   -Home medication reviewed will resume accordingly, holding statins due to transaminitis      --------------------------------------------------------------------------------------------------------------------------------------- Nutritional status:  The patient's BMI is: Body mass index is 24.97 kg/m. I agree with the assessment and plan as outlined...     Skin Assessment: I have examined the patient's skin and I agree with the wound assessment as performed by wound care team As outlined -------------------------------------------------------------------------------------------------------------------------------------- Cultures; Blood Cultures x 2>>> growing MSSA, repeating blood cultures today 05/14/2020 Urine Culture  >>> NGT    Antimicrobials: IV antibiotics of vancomycin and cefepime till 5-22, switch to Ancef   Consultants: ID  ---------------------------------------------------------------------------------------------------------------------------------------  DVT prophylaxis:  SCD/Compression stockings and Heparin SQ Code Status:   Code Status: Full  Code  Family Communication: Discussed with patient Patient's niece Ms. [name] was updated on the phone [phone] The above findings and plan of care has been discussed with patient (niece Ms. [name])  in detail,  they expressed understanding and agreement of above. -Advance care planning has been discussed.   Admission status:   Status is: Inpatient  Remains inpatient appropriate because:IV treatments appropriate due to intensity of illness or inability to take PO and Inpatient level of care appropriate due to severity of illness   Dispo: The patient is from: Home              Anticipated d/c is to: Home versus home with home health              Patient currently is not medically stable to d/c.  Requiring repeat blood cultures, IV antibiotics,   Difficult to place patient No      Level of care: Telemetry   Procedures:   No admission procedures for hospital encounter.    Antimicrobials:  Anti-infectives (From admission, onward)   Start     Dose/Rate Route Frequency Ordered Stop   05/14/20 1000  ceFAZolin (ANCEF) IVPB 2g/100 mL premix        2 g 200 mL/hr over 30 Minutes Intravenous  Every 8 hours 05/14/20 0905     05/13/20 2200  vancomycin (VANCOCIN) IVPB 1000 mg/200 mL premix  Status:  Discontinued        1,000 mg 200 mL/hr over 60 Minutes Intravenous Every 24 hours 05/13/20 0254 05/14/20 0903   05/13/20 0800  metroNIDAZOLE (FLAGYL) IVPB 500 mg  Status:  Discontinued        500 mg 100 mL/hr over 60 Minutes Intravenous Every 8 hours 05/13/20 0241 05/14/20 0904   05/13/20 0800  ceFEPIme (MAXIPIME) 2 g in sodium chloride 0.9 % 100 mL IVPB  Status:  Discontinued        2 g 200 mL/hr over 30 Minutes Intravenous Every 8 hours 05/13/20 0254 05/14/20 0903   05/12/20 2315  aztreonam (AZACTAM) 2 g in sodium chloride 0.9 % 100 mL IVPB  Status:  Discontinued        2 g 200 mL/hr over 30 Minutes Intravenous  Once 05/12/20 2308 05/12/20 2312   05/12/20 2315   metroNIDAZOLE (FLAGYL) IVPB 500 mg        500 mg 100 mL/hr over 60 Minutes Intravenous  Once 05/12/20 2308 05/13/20 0141   05/12/20 2315  vancomycin (VANCOCIN) IVPB 1000 mg/200 mL premix        1,000 mg 200 mL/hr over 60 Minutes Intravenous  Once 05/12/20 2308 05/13/20 0048   05/12/20 2315  ceFEPIme (MAXIPIME) 2 g in sodium chloride 0.9 % 100 mL IVPB        2 g 200 mL/hr over 30 Minutes Intravenous  Once 05/12/20 2312 05/13/20 0048       Medication:  . folic acid  1 mg Oral Daily  . heparin  5,000 Units Subcutaneous Q8H  . lactobacillus  1 g Oral TID WC  . lamoTRIgine  150 mg Oral BID  . levETIRAcetam  1,000 mg Oral BID    acetaminophen **OR** acetaminophen, HYDROmorphone (DILAUDID) injection, LORazepam, ondansetron **OR** ondansetron (ZOFRAN) IV   Objective:   Vitals:   05/14/20 1439 05/14/20 2001 05/15/20 0426 05/15/20 1324  BP: 106/74 114/77 (!) 88/64 100/69  Pulse: 91 (!) 104 95 97  Resp: _0 Temp: 98 F (36.7 C) 98.9 F (37.2 C) 98.7 F (37.1 C) 98 F (36.7 C)  TempSrc: Oral   Oral  SpO2: 93% 94% 94% 93%  Weight:      Height:        Intake/Output Summary (Last 24 hours) at 05/15/2020 1336 Last data filed at 05/15/2020 0900 Gross per 24 hour  Intake 780 ml  Output --  Net 780 ml   Filed Weights   05/12/20 2220  Weight: 58 kg     Examination:      Physical Exam:   General:  Alert, oriented, cooperative, mild distress or shortness of breath, on supplemental oxygen  HEENT:  Normocephalic, PERRL, otherwise with in Normal limits   Neuro:  CNII-XII intact. , normal motor and sensation, reflexes intact   Lungs:   Clear to auscultation BL, Respirations unlabored, no wheezes / crackles  Cardio:    S1/S2, RRR, No murmure, No Rubs or Gallops   Abdomen:   Soft, non-tender, bowel sounds active all four quadrants,  no guarding or peritoneal signs.  Muscular skeletal:   Severe generalized weaknesses, Limited exam - in bed, able to move all 4 extremities,   2+ pulses,  symmetric, No pitting edema  Skin:  Dry, warm to touch, negative for any Rashes,  Wounds: Please see nursing documentation            ------------------------------------------------------------------------------------------------------------------------------------------  LABs:  CBC Latest Ref Rng & Units 05/14/2020 05/13/2020 05/12/2020  WBC 4.0 - 10.5 K/uL 10.0 14.4(H) 12.4(H)  Hemoglobin 12.0 - 15.0 g/dL 9.4(L) 9.7(L) 10.4(L)  Hematocrit 36.0 - 46.0 % 28.2(L) 30.0(L) 30.3(L)  Platelets 150 - 400 K/uL 171 164 214   CMP Latest Ref Rng & Units 05/14/2020 05/13/2020 05/12/2020  Glucose 70 - 99 mg/dL 106(H) 113(H) 109(H)  BUN 6 - 20 mg/dL _0 Creatinine 0.44 - 1.00 mg/dL 0.55 0.53 0.62  Sodium 135 - 145 mmol/L 134(L) 138 134(L)  Potassium 3.5 - 5.1 mmol/L 3.2(L) 3.2(L) 3.7  Chloride 98 - 111 mmol/L 108 109 104  CO2 22 - 32 mmol/L 21(L) 21(L) 21(L)  Calcium 8.9 - 10.3 mg/dL 7.4(L) 7.9(L) 8.6(L)  Total Protein 6.5 - 8.1 g/dL 5.3(L) 5.3(L) 6.0(L)  Total Bilirubin 0.3 - 1.2 mg/dL 1.4(H) 1.5(H) 1.9(H)  Alkaline Phos 38 - 126 U/L 126 140(H) 165(H)  AST 15 - 41 U/L 127(H) 121(H) 138(H)  ALT 0 - 44 U/L 58(H) 60(H) 66(H)       Micro Results Recent Results (from the past 240 hour(s))  Culture, blood (routine x 2)     Status: Abnormal   Collection Time: 05/12/20 10:45 PM   Specimen: BLOOD LEFT FOREARM  Result Value Ref Range Status   Specimen Description   Final    BLOOD LEFT FOREARM Performed at [facility], [address], [place], [place] [address]    Special Requests   Final    Blood Culture results may not be optimal due to an excessive volume of blood received in culture bottles BOTTLES DRAWN AEROBIC AND ANAEROBIC Performed at [facility], [address], [place], [place] [address]    Culture  Setup Time   Final    GRAM POSITIVE COCCI BOTH ANAEROBIC AND AEROBIC BOTTLES Gram Stain Report Called to,Read Back By and Verified With: GANTT,E_1  BY MATTHEWS, B  5.1.22 Organism ID to follow CRITICAL RESULT CALLED TO, READ BACK BY AND VERIFIED WITH: N,ONDARA RN _2  05/13/20 AH  Performed at [facility], [place] [address], [place], [place] [address]    Culture STAPHYLOCOCCUS AUREUS (A)  Final   Report Status 05/15/2020 FINAL  Final   Organism ID, Bacteria STAPHYLOCOCCUS AUREUS  Final      Susceptibility   Staphylococcus aureus - MIC*    CIPROFLOXACIN >=8 RESISTANT Resistant     ERYTHROMYCIN >=8 RESISTANT Resistant     GENTAMICIN <=0.5 SENSITIVE Sensitive     OXACILLIN <=0.25 SENSITIVE Sensitive     TETRACYCLINE <=1 SENSITIVE Sensitive     VANCOMYCIN <=0.5 SENSITIVE Sensitive     TRIMETH/SULFA <=10 SENSITIVE Sensitive     CLINDAMYCIN <=0.25 SENSITIVE Sensitive     RIFAMPIN <=0.5 SENSITIVE Sensitive     Inducible Clindamycin NEGATIVE Sensitive     * STAPHYLOCOCCUS AUREUS  Blood Culture ID Panel (Reflexed)     Status: Abnormal   Collection Time: 05/12/20 10:45 PM  Result Value Ref Range Status   Enterococcus faecalis NOT DETECTED NOT DETECTED Final   Enterococcus Faecium NOT DETECTED NOT DETECTED Final   Listeria monocytogenes NOT DETECTED NOT DETECTED Final   Staphylococcus species DETECTED (A) NOT DETECTED Final    Comment: CRITICAL RESULT CALLED TO, READ BACK BY AND VERIFIED WITH: NACKSON ONDARA,RN AT 2132 05/13/2020 A.HUGHES    Staphylococcus aureus (BCID) DETECTED (A) NOT DETECTED Final    Comment: CRITICAL RESULT CALLED TO, READ BACK BY AND VERIFIED WITHValentino Hue, RN AT  2132 05/13/2020  A.HUGHES    Staphylococcus epidermidis NOT DETECTED NOT DETECTED Final   Staphylococcus lugdunensis NOT DETECTED NOT DETECTED Final   Streptococcus species NOT DETECTED NOT DETECTED Final   Streptococcus agalactiae NOT DETECTED NOT DETECTED Final   Streptococcus pneumoniae NOT DETECTED NOT DETECTED Final   Streptococcus pyogenes NOT DETECTED NOT DETECTED Final   A.calcoaceticus-baumannii NOT DETECTED NOT DETECTED Final   Bacteroides  fragilis NOT DETECTED NOT DETECTED Final   Enterobacterales NOT DETECTED NOT DETECTED Final   Enterobacter cloacae complex NOT DETECTED NOT DETECTED Final   Escherichia coli NOT DETECTED NOT DETECTED Final   Klebsiella aerogenes NOT DETECTED NOT DETECTED Final   Klebsiella oxytoca NOT DETECTED NOT DETECTED Final   Klebsiella pneumoniae NOT DETECTED NOT DETECTED Final   Proteus species NOT DETECTED NOT DETECTED Final   Salmonella species NOT DETECTED NOT DETECTED Final   Serratia marcescens NOT DETECTED NOT DETECTED Final   Haemophilus influenzae NOT DETECTED NOT DETECTED Final   Neisseria meningitidis NOT DETECTED NOT DETECTED Final   Pseudomonas aeruginosa NOT DETECTED NOT DETECTED Final   Stenotrophomonas maltophilia NOT DETECTED NOT DETECTED Final   Candida albicans NOT DETECTED NOT DETECTED Final   Candida auris NOT DETECTED NOT DETECTED Final   Candida glabrata NOT DETECTED NOT DETECTED Final   Candida krusei NOT DETECTED NOT DETECTED Final   Candida parapsilosis NOT DETECTED NOT DETECTED Final   Candida tropicalis NOT DETECTED NOT DETECTED Final   Cryptococcus neoformans/gattii NOT DETECTED NOT DETECTED Final   Meth resistant mecA/C and MREJ NOT DETECTED NOT DETECTED Final    Comment: Performed at [facility], [address], [place], [place] [address]  Resp Panel by RT-PCR (Flu A&B, Covid) Nasopharyngeal Swab     Status: None   Collection Time: 05/12/20 10:46 PM   Specimen: Nasopharyngeal Swab; Nasopharyngeal(NP) swabs in vial transport medium  Result Value Ref Range Status   SARS Coronavirus 2 by RT PCR NEGATIVE NEGATIVE Final    Comment: (NOTE) SARS-CoV-2 target nucleic acids are NOT DETECTED.  The SARS-CoV-2 RNA is generally detectable in upper respiratory specimens during the acute phase of infection. The lowest concentration of SARS-CoV-2 viral copies this assay can detect is 138 copies/mL. A negative result does not preclude SARS-Cov-2 infection and should not  be used as the sole basis for treatment or other patient management decisions. A negative result may occur with  improper specimen collection/handling, submission of specimen other than nasopharyngeal swab, presence of viral mutation(s) within the areas targeted by this assay, and inadequate number of viral copies(<138 copies/mL). A negative result must be combined with clinical observations, patient history, and epidemiological information. The expected result is Negative.  Fact Sheet for Patients:  EntrepreneurPulse.com.au  Fact Sheet for Healthcare Providers:  IncredibleEmployment.be  This test is no t yet approved or cleared by the [place] FDA and  has been authorized for detection and/or diagnosis of SARS-CoV-2 by FDA under an Emergency Use Authorization (EUA). This EUA will remain  in effect (meaning this test can be used) for the duration of the COVID-19 declaration under Section 564(b)(1) of the Act, 21 U.S.C.section 360bbb-3(b)(1), unless the authorization is terminated  or revoked sooner.       Influenza A by PCR NEGATIVE NEGATIVE Final   Influenza B by PCR NEGATIVE NEGATIVE Final    Comment: (NOTE) The Xpert Xpress SARS-CoV-2/FLU/RSV plus assay is intended as an aid in the diagnosis of influenza from Nasopharyngeal swab specimens and should not be used as a sole basis for  treatment. Nasal washings and aspirates are unacceptable for Xpert Xpress SARS-CoV-2/FLU/RSV testing.  Fact Sheet for Patients: EntrepreneurPulse.com.au  Fact Sheet for Healthcare Providers: IncredibleEmployment.be  This test is not yet approved or cleared by the [place] FDA and has been authorized for detection and/or diagnosis of SARS-CoV-2 by FDA under an Emergency Use Authorization (EUA). This EUA will remain in effect (meaning this test can be used) for the duration of the COVID-19 declaration under Section  564(b)(1) of the Act, 21 U.S.C. section 360bbb-3(b)(1), unless the authorization is terminated or revoked.  Performed at [facility], [address], [place], [place] [address]   Culture, blood (routine x 2)     Status: Abnormal   Collection Time: 05/12/20 11:10 PM   Specimen: BLOOD RIGHT HAND  Result Value Ref Range Status   Specimen Description   Final    BLOOD RIGHT HAND Performed at [facility], [address], [place], [place] [address]    Special Requests   Final    Blood Culture adequate volume BOTTLES DRAWN AEROBIC AND ANAEROBIC Performed at [facility], [address], [place], [place] [address]    Culture  Setup Time   Final    GRAM POSITIVE COCCI ANAEROBIC AND AEROBIC BOTTLES Gram Stain Report Called to,Read Back By and Verified With: GANTT,E_0  BY MATTHEWS B 5.1.22 CRITICAL VALUE NOTED.  VALUE IS CONSISTENT WITH PREVIOUSLY REPORTED AND CALLED VALUE.    Culture (A)  Final    STAPHYLOCOCCUS AUREUS SUSCEPTIBILITIES PERFORMED ON PREVIOUS CULTURE WITHIN THE LAST 5 DAYS. Performed at [facility], [place] [address], [place], [place] [address]    Report Status 05/15/2020 FINAL  Final  Culture, Urine     Status: Abnormal (Preliminary result)   Collection Time: 05/13/20  1:41 AM   Specimen: Urine, Clean Catch  Result Value Ref Range Status   Specimen Description   Final    URINE, CLEAN CATCH Performed at [facility], [address] [place], [place] [address]    Special Requests   Final    NONE Performed at [facility], [address], [place], [place] [address]    Culture >=100,000 COLONIES/mL STAPHYLOCOCCUS AUREUS (A)  Final   Report Status PENDING  Incomplete  Culture, blood (single)     Status: None (Preliminary result)   Collection Time: 05/14/20 10:08 AM   Specimen: BLOOD  Result Value Ref Range Status   Specimen Description BLOOD BLOOD LEFT HAND  Final   Special Requests   Final    Blood Culture results may not be optimal due to an inadequate volume  of blood received in culture bottles BOTTLES DRAWN AEROBIC AND ANAEROBIC   Culture   Final    NO GROWTH < 24 HOURS Performed at [facility], [address] [place], [place] [address]    Report Status PENDING  Incomplete  Body fluid culture w Gram Stain     Status: None (Preliminary result)   Collection Time: 05/14/20 12:16 PM   Specimen: Ascitic; Body Fluid  Result Value Ref Range Status   Specimen Description   Final    ASCITIC Performed at [facility], [address] [place],  [address]    Special Requests   Final    NONE Performed at [facility], [address], [place],  [address]    Gram Stain NO WBC SEEN NO ORGANISMS SEEN   Final   Culture   Final    NO GROWTH < 24 HOURS Performed at [facility],  [address], [place], [place] [address]    Report Status PENDING  Incomplete    Radiology Reports CT HEAD WO CONTRAST  Result Date: 05/13/2020 CLINICAL DATA:  Fall 2 days prior EXAM: CT HEAD WITHOUT CONTRAST TECHNIQUE: Contiguous axial images were obtained from the base of the skull through the vertex without intravenous contrast. COMPARISON:  CT 02/20/2020 FINDINGS: Brain: No evidence of acute infarction, hemorrhage, hydrocephalus, extra-axial collection, visible mass lesion or mass effect. Vascular: Atherosclerotic calcification of the carotid siphons. No hyperdense vessel. Skull: No calvarial fracture or suspicious osseous lesion. No scalp swelling or hematoma. Sinuses/Orbits: Paranasal sinuses are predominantly clear. Mastoid air cells appear chronically hypo pneumatized. Middle ear cavities are clear. Debris in the bilateral external auditory canals. Included orbital structures are unremarkable. Other: Asymmetric thickening and calcification in the left parotid gland is a chronic, nonspecific finding, similar to comparison study. IMPRESSION: No acute intracranial abnormality. No calvarial fracture, significant scalp swelling or hematoma. Intracranial  atherosclerosis. Asymmetric thickening and calcification in the left parotid gland, a nonspecific, chronic finding unchanged from prior. Can reflect sequela of a chronic inflammatory process. Debris in the external auditory canals, correlate for cerumen impaction. Electronically Signed   By: Lovena Le M.D.   On: 05/13/2020 00:44   CT ABDOMEN PELVIS W CONTRAST  Result Date: 05/13/2020 CLINICAL DATA:  Abdominal pain, fever, right upper quadrant pain EXAM: CT ABDOMEN AND PELVIS WITH CONTRAST TECHNIQUE: Multidetector CT imaging of the abdomen and pelvis was performed using the standard protocol following bolus administration of intravenous contrast. CONTRAST:  151m OMNIPAQUE IOHEXOL 300 MG/ML  SOLN COMPARISON:  CT 02/12/2020 FINDINGS: Lower chest: Basilar atelectatic changes in the lungs including more bandlike areas of subsegmental atelectasis or scarring. Normal heart size. No pericardial effusion. Hepatobiliary: Heterogeneous hepatic attenuation with a nodular liver surface contour concerning for intrinsic liver disease/cirrhosis. No concerning focal liver lesion. Moderate gallbladder distension. No significant gallbladder wall thickening or pericholecystic inflammation is seen. No visible calcified gallstones or biliary ductal dilatation. Pancreas: Slightly edematous appearance of the pancreatic parenchyma with very faint peripancreatic haze, uniform enhancement. No pancreatic ductal dilatation. Spleen: Mild splenomegaly.  No concerning focal splenic lesion. Adrenals/Urinary Tract: Normal adrenals. Kidneys enhance symmetrically. Symmetric and uniform excretion. No concerning focal renal lesion. Bilateral renal cortical scarring. Nonobstructing calculi present in both kidneys. No obstructive urolithiasis or hydronephrosis is seen at this time. Bladder is unremarkable for the degree of distention. Stomach/Bowel: Paraesophageal venous collaterals and varices. Some minimal collateralization and varices about the  gastric fundus as well. Thickening towards the gastric antrum likely reflecting normal peristalsis. Air and fluid-filled appearance of the duodenum with a normal sweep across the midline abdomen. Slightly edematous appearance diffusely through the small bowel is nonspecific given additional findings above. Moderate right-sided colonic stool burden, particularly within the cecum. Some mild circumferential thickening and mucosal hyperemia about the cecum and ascending colon is nonspecific given hepatic features. No evidence of mechanical obstruction. Appendix is not well visualized. Vascular/Lymphatic: Atherosclerotic calcifications within the abdominal aorta and branch vessels. No aneurysm or ectasia. Paraesophageal and gastric varices with venous collaterals, as above. Splenorenal collateralization. Upper abdominal varices.No enlarged abdominopelvic lymph nodes. Few scattered calcified lymph nodes are present in the abdomen and pelvis. Reproductive: Portion the pelvis obscured by streak artifact. 2.6 cm cystic focus in the left adnexa, unchanged from comparison. No concerning right adnexal lesion. Prior hysterectomy. Other: Increasing edematous changes of the central mesentery. Small volume low-attenuation fluid in the deep pelvis as well as trace perihepatic ascites. Circumferential  body wall edema. No bowel containing hernia. No free abdominopelvic air. Musculoskeletal: Multilevel degenerative changes are present in the imaged portions of the spine. Grade 1 anterolisthesis L4 on L5 without spondylolysis, unchanged from prior. Prior left hip arthroplasty with chronic bony remodeling and a similar appearance overall to comparison prior. IMPRESSION: Constellation of features suggestive of intrinsic liver disease/cirrhosis with features of portal hypertension including a nodular, heterogeneous liver with paraesophageal and gastric varices, additional upper abdominal venous collateralization, splenorenal shunting and  splenomegaly. Interval development of a small volume ascites as well as additional features of at least mild or early developing anasarca with circumferential body wall edema and central mesenteric edema. Some hazy peripancreatic stranding may be present, possibly related to a diffusely edematous appearance of the mesentery though could correlate with lipase. Mildly thickened appearance of the cecum and ascending colon as well as a diffusely edematous appearance of the small bowel is nonspecific in the setting of suspected intrinsic liver disease. Could reflect portal enteropathy/colopathy though should exclude clinical symptoms of enterocolitis. Bilateral nonobstructing nephrolithiasis. No obstructive urolithiasis or hydronephrosis at this time. Bilateral renal cortical scarring. Prior hysterectomy. Stable 2.7 cm left ovarian cyst. Continued stability favoring a benign process. No follow-up imaging recommended. Note: This recommendation does not apply to premenarchal patients and to those with increased risk (genetic, family history, elevated tumor markers or other high-risk factors) of ovarian cancer. Reference: JACR 2020 Feb; 17(2):248-254 Aortic Atherosclerosis (ICD10-I70.0). Electronically Signed   By: Lovena Le M.D.   On: 05/13/2020 00:35   US Abdomen Limited  Result Date: 05/13/2020 CLINICAL DATA:  Right upper quadrant pain. EXAM: ULTRASOUND ABDOMEN LIMITED RIGHT UPPER QUADRANT COMPARISON:  February 23, 2020 ultrasound.  CT scan May 12, 2020. FINDINGS: Gallbladder: A small amount of sludge is seen in the gallbladder. No stones, Murphy's sign, or wall thickening. A small amount of ascites is identified, including adjacent to the gallbladder. Common bile duct: Diameter: 5 mm Liver: There is a nodular contour in the liver. No focal mass. Portal vein is patent on color Doppler imaging with normal direction of blood flow towards the liver. Other: Mild ascites. IMPRESSION: 1. Nodular contour to the liver  suggesting cirrhosis.  Mild ascites. 2. Mild sludge in otherwise normal appearing gallbladder. A small amount of fluid adjacent to the gallbladder is likely due to the ascites. Electronically Signed   By: Dorise Bullion III M.D   On: 05/13/2020 09:06   US Paracentesis  Result Date: 05/14/2020 INDICATION: Fever, abdominal pain, question spontaneous bacterial peritonitis, ascites on CT EXAM: ULTRASOUND GUIDED DIAGNOSTIC PARACENTESIS MEDICATIONS: None COMPLICATIONS: None immediate PROCEDURE: Informed written consent was obtained from the patient after a discussion of the risks, benefits and alternatives to treatment. A timeout was performed prior to the initiation of the procedure. Initial ultrasound scanning demonstrates a small amount of ascites within the right lower abdominal quadrant. The right lower abdomen was prepped and draped in the usual sterile fashion. 1% lidocaine was used for local anesthesia. Following this, a 5 [place] Yueh catheter was introduced. An ultrasound image was saved for documentation purposes. The paracentesis was performed. The catheter was removed and a dressing was applied. The patient tolerated the procedure well without immediate post procedural complication. FINDINGS: A total of approximately 20 mL of clear yellow ascitic fluid was removed. Samples were sent to the laboratory as requested by the clinical team. IMPRESSION: Successful ultrasound-guided paracentesis yielding 20 mL of peritoneal fluid for diagnostic testing. Electronically Signed   By: Crist Infante.D.  On: 05/14/2020 12:39   DG Chest Portable 1 View  Result Date: 05/12/2020 CLINICAL DATA:  Right sided rib pain. EXAM: PORTABLE CHEST 1 VIEW COMPARISON:  February 12, 2020 FINDINGS: A very mild amount of linear atelectasis is seen within the right lung base. There is no evidence of acute infiltrate, pleural effusion or pneumothorax. The heart size and mediastinal contours are within normal limits. A chronic seventh  right rib fracture is seen. IMPRESSION: 1. Very mild right basilar linear atelectasis. 2. Chronic seventh right rib fracture. Electronically Signed   By: Virgina Norfolk M.D.   On: 05/12/2020 23:16   US ABDOMEN COMPLETE W/ELASTOGRAPHY  Result Date: 05/03/2020 CLINICAL DATA:  Fatty liver EXAM: ULTRASOUND ABDOMEN ULTRASOUND HEPATIC ELASTOGRAPHY TECHNIQUE: Sonography of the upper abdomen was performed. In addition, ultrasound elastography evaluation of the liver was performed. A region of interest was placed within the right lobe of the liver. Following application of a compressive sonographic pulse, tissue compressibility was assessed. Multiple assessments were performed at the selected site. Median tissue compressibility was determined. Previously, hepatic stiffness was assessed by shear wave velocity. Based on recently published Society of Radiologists in Ultrasound consensus article, reporting is now recommended to be performed in the SI units of pressure (kiloPascals) representing hepatic stiffness/elasticity. The obtained result is compared to the published reference standards. (cACLD = compensated Advanced Chronic Liver Disease) COMPARISON:  02/23/2020 FINDINGS: ULTRASOUND ABDOMEN Gallbladder: Gallbladder partially contracted without evidence of gallstones or sonographic Murphy sign. Small amount of internal sludge is present. No wall thickening. Common bile duct: Diameter: 6 mm upper normal Liver: Echogenic hepatic parenchyma with nodular contour consistent with cirrhosis. No discrete hepatic mass. Portal vein is patent on color Doppler imaging with normal direction of blood flow towards the liver. IVC: Normal appearance Pancreas: Normal appearance Spleen: 11.9 cm length, calculated volume 399 mL.  No focal mass. Right Kidney: Length: 11.9 cm. Normal morphology without mass or hydronephrosis. Left Kidney: Length: 12.0 cm. Normal morphology without mass or hydronephrosis. Abdominal aorta: Normal caliber  Other findings: Scattered ascites. ULTRASOUND HEPATIC ELASTOGRAPHY Device: Siemens Helix VTQ Patient position: Not recorded Transducer 5C1 Number of measurements: 10 Hepatic segment:  8 Median kPa: 95.8 IQR: 11.6 IQR/Median kPa ratio: 0.1 Data quality:  Good Diagnostic category: > or =17 kPa: highly suggestive of cACLD with an increased probability of clinically significant portal hypertension The use of hepatic elastography is applicable to patients with viral hepatitis and non-alcoholic fatty liver disease. At this time, there is insufficient data for the referenced cut-off values and use in other causes of liver disease, including alcoholic liver disease. Patients, however, may be assessed by elastography and serve as their own reference standard/baseline. In patients with non-alcoholic liver disease, the values suggesting compensated advanced chronic liver disease (cACLD) may be lower, and patients may need additional testing with elasticity results of 7-9 kPa. Please note that abnormal hepatic elasticity and shear wave velocities may also be identified in clinical settings other than with hepatic fibrosis, such as: acute hepatitis, elevated right heart and central venous pressures including use of beta blockers, veno-occlusive disease (Budd-Chiari), infiltrative processes such as mastocytosis/amyloidosis/infiltrative tumor/lymphoma, extrahepatic cholestasis, with hyperemia in the post-prandial state, and with liver transplantation. Correlation with patient history, laboratory data, and clinical condition recommended. Diagnostic Categories: < or =5 kPa: high probability of being normal < or =9 kPa: in the absence of other known clinical signs, rules out cACLD >9 kPa and ?13 kPa: suggestive of cACLD, but needs further testing >13 kPa: highly suggestive of  cACLD > or =17 kPa: highly suggestive of cACLD with an increased probability of clinically significant portal hypertension IMPRESSION: ULTRASOUND ABDOMEN:  Cirrhotic appearing liver without focal mass. ULTRASOUND HEPATIC ELASTOGRAPHY: Median kPa:  95.8 Diagnostic category: > or =17 kPa: highly suggestive of cACLD with an increased probability of clinically significant portal hypertension Electronically Signed   By: Lavonia Dana M.D.   On: 05/03/2020 17:09   [name] ASCITES (ABDOMEN LIMITED)  Result Date: 05/14/2020 CLINICAL DATA:  Ascites, question sufficient for paracentesis, clinical concern for spontaneous bacterial peritonitis EXAM: LIMITED ABDOMEN ULTRASOUND FOR ASCITES TECHNIQUE: Limited ultrasound survey for ascites was performed in all four abdominal quadrants. COMPARISON:  CT abdomen and pelvis 05/13/2020 FINDINGS: Small volume ascites identified in the lower quadrants bilaterally. Volume of ascites has probably slightly increased since prior CT. While the volume of fluid is insufficient for therapeutic paracentesis, a small volume of ascites may be obtainable for diagnostic purposes. Diagnostic paracentesis will be attempted. IMPRESSION: Small volume ascites, likely sufficient volume for diagnostic paracentesis. Electronically Signed   By: Lavonia Dana M.D.   On: 05/14/2020 11:11    SIGNED: Deatra James, MD, FHM. [facility],  Pager (please use [email or web address] to page/text) Please use Epic Secure Chat for non-urgent communication (7AM-7PM)  If 7PM-7AM, please contact night-coverage [email or web address], 05/15/2020, 1:36 PM

## 2020-05-15 NOTE — Progress Notes (Signed)
*  PRELIMINARY RESULTS* Echocardiogram 2D Echocardiogram has been performed.  Samuel Germany 05/15/2020, 1:14 PM

## 2020-05-15 NOTE — Progress Notes (Signed)
    [facility] has been requested to perform a transesophageal echocardiogram on [name] for bacteremia. After careful review of history and examination, the risks and benefits of transesophageal echocardiogram have been explained including risks of esophageal damage, perforation (1:10,000 risk), bleeding, pharyngeal hematoma as well as other potential complications associated with conscious sedation including aspiration, arrhythmia, respiratory failure and death. Alternatives to treatment were discussed, questions were answered. Patient is willing to proceed.   Hgb at 9.4 and platelets at 171 K by CBC on 05/14/2020. BP soft this AM but has improved and no requirement for pressor support. TTE performed today and results pending. TEE tentatively scheduled for 05/16/2020 at 2:45 with Dr. [name]. NPO after midnight.    Monica Heritage, PA-C  05/15/2020 10:33 AM

## 2020-05-15 NOTE — Progress Notes (Signed)
Pt alert and oriented x 4. Was confused at one point thinking she was downstairs and wanted to be taken to her room, reoriented and has been ok since. Pt weak and needs one person assist with adls. Continues on supplemental oxygen. Reports generalized pain and restless leg pain. Medication given with relief. Continues on IV abt. will continue plan of care.

## 2020-05-16 ENCOUNTER — Inpatient Hospital Stay (HOSPITAL_COMMUNITY): Payer: Medicare HMO | Admitting: Anesthesiology

## 2020-05-16 ENCOUNTER — Inpatient Hospital Stay (HOSPITAL_COMMUNITY): Payer: Medicare HMO

## 2020-05-16 ENCOUNTER — Encounter (HOSPITAL_COMMUNITY)
Admission: EM | Disposition: A | Discharge status: Skilled Nursing Facility | Payer: Self-pay | Source: Home / Self Care | Attending: Internal Medicine

## 2020-05-16 ENCOUNTER — Other Ambulatory Visit (HOSPITAL_COMMUNITY): Payer: Self-pay | Admitting: *Deleted

## 2020-05-16 DIAGNOSIS — R7881 Bacteremia: Secondary | ICD-10-CM

## 2020-05-16 DIAGNOSIS — R188 Other ascites: Secondary | ICD-10-CM | POA: Diagnosis not present

## 2020-05-16 DIAGNOSIS — R7401 Elevation of levels of liver transaminase levels: Secondary | ICD-10-CM

## 2020-05-16 DIAGNOSIS — I517 Cardiomegaly: Secondary | ICD-10-CM

## 2020-05-16 DIAGNOSIS — I358 Other nonrheumatic aortic valve disorders: Secondary | ICD-10-CM

## 2020-05-16 DIAGNOSIS — A4101 Sepsis due to Methicillin susceptible Staphylococcus aureus: Secondary | ICD-10-CM | POA: Diagnosis not present

## 2020-05-16 DIAGNOSIS — D329 Benign neoplasm of meninges, unspecified: Secondary | ICD-10-CM

## 2020-05-16 DIAGNOSIS — R7989 Other specified abnormal findings of blood chemistry: Secondary | ICD-10-CM | POA: Diagnosis not present

## 2020-05-16 DIAGNOSIS — R6520 Severe sepsis without septic shock: Secondary | ICD-10-CM

## 2020-05-16 DIAGNOSIS — J9601 Acute respiratory failure with hypoxia: Secondary | ICD-10-CM | POA: Diagnosis not present

## 2020-05-16 HISTORY — PX: TEE WITHOUT CARDIOVERSION: SHX5443

## 2020-05-16 LAB — URINE CULTURE: Culture: >=100000 — AB

## 2020-05-16 LAB — COMPREHENSIVE METABOLIC PANEL
ALT: 43 U/L (ref 0–44)
AST: 109 U/L — ABNORMAL HIGH (ref 15–41)
Albumin: 2.3 g/dL — ABNORMAL LOW (ref 3.5–5.0)
Alkaline Phosphatase: 131 U/L — ABNORMAL HIGH (ref 38–126)
Anion gap: 7 (ref 5–15)
BUN: 9 mg/dL (ref 6–20)
CO2: 23 mmol/L (ref 22–32)
Calcium: 7.5 mg/dL — ABNORMAL LOW (ref 8.9–10.3)
Chloride: 104 mmol/L (ref 98–111)
Creatinine, Ser: 0.5 mg/dL (ref 0.44–1.00)
GFR, Estimated: >60 mL/min (ref >60)
Glucose, Bld: 111 mg/dL — ABNORMAL HIGH (ref 70–99)
Potassium: 3.3 mmol/L — ABNORMAL LOW (ref 3.5–5.1)
Sodium: 134 mmol/L — ABNORMAL LOW (ref 135–145)
Total Bilirubin: 1.8 mg/dL — ABNORMAL HIGH (ref 0.3–1.2)
Total Protein: 5.3 g/dL — ABNORMAL LOW (ref 6.5–8.1)

## 2020-05-16 LAB — CBC
HCT: 29.1 % — ABNORMAL LOW (ref 36.0–46.0)
Hemoglobin: 10 g/dL — ABNORMAL LOW (ref 12.0–15.0)
MCH: 30.7 pg (ref 26.0–34.0)
MCHC: 34.4 g/dL (ref 30.0–36.0)
MCV: 89.3 fL (ref 80.0–100.0)
Platelets: 197 10*3/uL (ref 150–400)
RBC: 3.26 MIL/uL — ABNORMAL LOW (ref 3.87–5.11)
RDW: 17 % — ABNORMAL HIGH (ref 11.5–15.5)
WBC: 11.2 10*3/uL — ABNORMAL HIGH (ref 4.0–10.5)
nRBC: 0 % (ref 0.0–0.2)

## 2020-05-16 LAB — BLOOD GAS, ARTERIAL
Acid-base deficit: 0.1 mmol/L (ref 0.0–2.0)
Bicarbonate: 24.6 mmol/L (ref 20.0–28.0)
FIO2: 80
O2 Saturation: 90.2 %
Patient temperature: 37
pCO2 arterial: 32.1 mmHg (ref 32.0–48.0)
pH, Arterial: 7.471 — ABNORMAL HIGH (ref 7.350–7.450)
pO2, Arterial: 57.5 mmHg — ABNORMAL LOW (ref 83.0–108.0)

## 2020-05-16 SURGERY — ECHOCARDIOGRAM, TRANSESOPHAGEAL
Anesthesia: General

## 2020-05-16 MED ORDER — ORAL CARE MOUTH RINSE: 15.0000 mL | Freq: Once | OROMUCOSAL | Status: DC

## 2020-05-16 MED ORDER — PROPOFOL 500 MG/50ML IV EMUL
INTRAVENOUS | Status: DC | PRN
  Administered 2020-05-16: 150 ug/kg/min via INTRAVENOUS

## 2020-05-16 MED ORDER — LACTATED RINGERS IV SOLN: INTRAVENOUS | Status: DC

## 2020-05-16 MED ORDER — CHLORHEXIDINE GLUCONATE CLOTH 2 % EX PADS
6.0000 | MEDICATED_PAD | Freq: Every day | CUTANEOUS | Status: DC
  Administered 2020-05-16 – 2020-05-27 (×12): 6 via TOPICAL

## 2020-05-16 MED ORDER — CHLORHEXIDINE GLUCONATE 0.12 % MT SOLN: 15.0000 mL | Freq: Once | OROMUCOSAL | Status: DC

## 2020-05-16 MED ORDER — ALBUTEROL SULFATE (2.5 MG/3ML) 0.083% IN NEBU
2.5000 mg | INHALATION_SOLUTION | Freq: Once | RESPIRATORY_TRACT | Status: AC
  Administered 2020-05-16: 2.5 mg via RESPIRATORY_TRACT
  Filled 2020-05-16: qty 3

## 2020-05-16 MED ORDER — FUROSEMIDE 10 MG/ML IJ SOLN
40.0000 mg | Freq: Once | INTRAMUSCULAR | Status: AC
  Administered 2020-05-16: 40 mg via INTRAVENOUS
  Filled 2020-05-16: qty 4

## 2020-05-16 NOTE — TOC Progression Note (Signed)
Transition of Care [facility]) - Progression Note    Patient Details  Name: [name] MRN: [id number] Date of Birth: [birth year]  [facility] CM/SW Contact  Boneta Lucks, RN Phone Number: 05/16/2020, 1:26 PM  Clinical Narrative:   [facility] would not accept referral. Marjory Lies with [facility] accepted. Carolynn Sayers meeting with family today at 2pm to do home IV antibiotic education. TOC to follow.

## 2020-05-16 NOTE — Progress Notes (Signed)
*  PRELIMINARY RESULTS* Echocardiogram Echocardiogram Transesophageal has been performed.  [name] 05/16/2020, 11:02 AM

## 2020-05-16 NOTE — Anesthesia Postprocedure Evaluation (Signed)
Anesthesia Post Note  Patient: [name]  Procedure(s) Performed: TRANSESOPHAGEAL ECHOCARDIOGRAM (TEE) WITH PROPOFOL (N/A )  Patient location during evaluation: Phase II Anesthesia Type: General Level of consciousness: awake Pain management: pain level controlled Vital Signs Assessment: post-procedure vital signs reviewed and stable Respiratory status: spontaneous breathing and respiratory function stable Cardiovascular status: blood pressure returned to baseline and stable Postop Assessment: no headache and no apparent nausea or vomiting Anesthetic complications: no Comments: Late entry   No complications documented.   Last Vitals:  Vitals:   05/16/20 1315 05/16/20 1345  BP: 123/80 138/90  Pulse: (!) 121 (!) 118  Resp: (!) 29 20  Temp:    SpO2: (!) 88% 90%    Last Pain:  Vitals:   05/16/20 1115  TempSrc:   PainSc: [place]

## 2020-05-16 NOTE — CV Procedure (Signed)
CV Procedure Note  Procedure: Transesophageal echocardiogram Indication: bacteremia Physician: Dr Carlyle Dolly  Patient was brought to the procedure suite after appropriate consent was obtained. Bite block was placed and patient placed in the left lateral decubitus position. Sedation achieved with the assistance of anesthesiology, for details please refer to there documentation. The TEE probe was intubated into the esophagus without difficulty and several images obtained. Cardiopulmonary monitoring performed throughout the procedure, she tolerated well without complications.   No evidence of vegetations or endocarditis. Please see full TEE report for complete findings   Carlyle Dolly MD

## 2020-05-16 NOTE — Progress Notes (Signed)
PROGRESS NOTE  [name] ZDG:[id number] DOB: [birth year] DOA: 05/12/2020 PCP: [name], MD  Brief History:  55 year old female with a history of COPD, meningioma, cardiac arrest December 2020, hepatic steatosis,PNES, cognitive impairment, depression, complex partial seizure presenting with generalized weakness and a fall.  This has been a recurrent problem for the patient.  She was admitted on 2/7 to 02/24/20 with a similar presentation.  At that time she was treated for acute metabolic encephalopathy which was thought to be multifactorial including elevated ammonia, dehydration and dopamine agonist.  She was discharged home with HHPT.  She follow up with [facility], Dr. 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  She underwent liver elastography on 05/03/20 which suggested cACLD with increased probability of clinically significant portal HTN. She presented on 05/12/20 with another mechanical fall.  She reports that her house is being remodeled and she tripped on something and fell forward hit her head on the railing and landed on her right chest.  She reports that she hit the back of her head on the way down.  She had no loss of consciousness.  At presentation her temperature was 101. Blood cultures were done and she was found to have MSSA bacteremia.  Also CT of the abd/pelvis on 05/13/20 showed signs of cirrhosis with portal HTN.  She also had splenomegaly.  THere was also a mildly thickened cecum and ascending colon with diffuse edematous appearance of the sm bowel. She underwent TEE on 05/16/20 after which she remained hypoxic on NRB.  She was then transferred to SDU for further care.  Assessment/Plan: Severe Sepsis -present on admission -presented with fever, tachycardia, tachypnea, elevated lactate -due to MSSA bacteremia -Lactic peaked 2.2>>1.8 -UA 6-10 WBC -CXR--no consolidation -Random cortisol level 17.1  MSSA  Bacteremia -05/12/20 blood culture--positive MSSA -05/14/20 blood cuture--neg to date -continue cefazolin -05/16/20 TEE--no vegetation  Acute respiratory failure with hypoxia -oxygen saturation 88-90% on NRB in PACU 5/4 -personally reviewed CXR--bilateral interstitial infiltrates -ABG 7.471/32/57/24 (0.8) -dose IV lasix x 1  NAFLD/Liver Cirrhosis -05/13/20 CT abd as discussed above -05/14/20 paracentesis--WBC 51, only 20 cc removed  Colonic Wall Thickening -Consult GI -?portal HTN colopathy  Gait instability/frequent falls/dizziness -PT evaluation-->HHPT -CT brain negativeacute findings -No focal deficits on exam -Check echo--EF 60-65%, no WMA  Complex partial seizure/PNES -Continue home dose of Keppra and Lamictal  Depression -Continue fluoxetine 80 mg daily -Continue as needed lorazepam  Meningioma -Patient follows Dr. [name] -s/p fractionated radiosurgery Sept 2020--Dr. [name] -01/18/20 MR brain--mass is stable in size as compared to the brain MRI of 09/13/2019 -no focal deficits on exam      Status is: Inpatient  Remains inpatient appropriate because:Hemodynamically unstable and IV treatments appropriate due to intensity of illness or inability to take PO   Dispo: The patient is from: Home              Anticipated d/c is to: Home              Patient currently is not medically stable to d/c.   Difficult to place patient No        Family Communication: no  Family at bedside  Consultants:  Cardiology/GI  Code Status:  FULL   DVT Prophylaxis:  [place] Heparin    Procedures: As Listed in Progress Note Above  Antibiotics: Cefazolin 5/2>>       Subjective: Patient denies fevers, chills,  headache, chest pain, dyspnea, nausea, vomiting, diarrhea, abdominal pain, dysuria, hematuria, hematochezia, and melena.   Objective: Vitals:   05/16/20 0925 05/16/20 1045 05/16/20 1100 05/16/20 1115  BP:  116/83 120/88 125/86  Pulse:  (!) 102 (!) 106 (!) 116   Resp:  (!) 24 (!) 24 (!) 27  Temp:  98.8 F (37.1 C)    TempSrc:      SpO2: 95% 95% 95% 90%  Weight:      Height:        Intake/Output Summary (Last 24 hours) at 05/16/2020 1209 Last data filed at 05/16/2020 1035 Gross per 24 hour  Intake 2321.92 ml  Output --  Net 2321.92 ml   Weight change:  Exam:   General:  Pt is alert, follows commands appropriately, not in acute distress  HEENT: No icterus, No thrush, No neck mass, [place]/AT  Cardiovascular: RRR, S1/S2, no rubs, no gallops  Respiratory: bilateral rales. No wheeze  Abdomen: Soft/+BS, non tender, non distended, no guarding  Extremities: trace LE edema, No lymphangitis, No petechiae, No rashes, no synovitis   Data Reviewed: I have personally reviewed following labs and imaging studies Basic Metabolic Panel: Recent Labs  Lab 05/12/20 2232 05/13/20 0551 05/14/20 0421  NA 134* 138 134*  K 3.7 3.2* 3.2*  CL 104 109 108  CO2 21* 21* 21*  GLUCOSE 109* 113* 106*  BUN 10 9 12   CREATININE 0.62 0.53 0.55  CALCIUM 8.6* 7.9* 7.4*  MG  --  1.4*  --    Liver Function Tests: Recent Labs  Lab 05/12/20 2232 05/13/20 0551 05/14/20 0421  AST 138* 121* 127*  ALT 66* 60* 58*  ALKPHOS 165* 140* 126  BILITOT 1.9* 1.5* 1.4*  PROT 6.0* 5.3* 5.3*  ALBUMIN 2.9* 2.6* 2.5*   Recent Labs  Lab 05/12/20 2232  LIPASE 37   No results for input(s): AMMONIA in the last 168 hours. Coagulation Profile: Recent Labs  Lab 05/13/20 0551  INR 1.4*   CBC: Recent Labs  Lab 05/12/20 2232 05/13/20 0551 05/14/20 0421  WBC 12.4* 14.4* 10.0  NEUTROABS  --  12.5*  --   HGB 10.4* 9.7* 9.4*  HCT 30.3* 30.0* 28.2*  MCV 91.0 92.9 92.5  PLT 214 164 171   Cardiac Enzymes: No results for input(s): CKTOTAL, CKMB, CKMBINDEX, TROPONINI in the last 168 hours. BNP: Invalid input(s): POCBNP CBG: No results for input(s): GLUCAP in the last 168 hours. HbA1C: No results for input(s): HGBA1C in the last 72 hours. Urine analysis:     Component Value Date/Time   COLORURINE YELLOW 05/13/2020 0141   APPEARANCEUR CLEAR 05/13/2020 0141   APPEARANCEUR Clear 01/30/2017 1020   LABSPEC 1.043 (H) 05/13/2020 0141   PHURINE 6.0 05/13/2020 0141   GLUCOSEU NEGATIVE 05/13/2020 0141   HGBUR SMALL (A) 05/13/2020 0141   BILIRUBINUR NEGATIVE 05/13/2020 0141   BILIRUBINUR Negative 01/30/2017 1020   KETONESUR NEGATIVE 05/13/2020 0141   PROTEINUR NEGATIVE 05/13/2020 0141   UROBILINOGEN negative 04/19/2014 1332   UROBILINOGEN 0.2 12/04/2013 1414   NITRITE POSITIVE (A) 05/13/2020 0141   LEUKOCYTESUR NEGATIVE 05/13/2020 0141   Sepsis Labs: @LABRCNTIP (procalcitonin:4,lacticidven:4) ) Recent Results (from the past 240 hour(s))  Culture, blood (routine x 2)     Status: Abnormal   Collection Time: 05/12/20 10:45 PM   Specimen: BLOOD LEFT FOREARM  Result Value Ref Range Status   Specimen Description   Final    BLOOD LEFT FOREARM Performed at [facility], [address] [place], [place] [address]  Special Requests   Final    Blood Culture results may not be optimal due to an excessive volume of blood received in culture bottles BOTTLES DRAWN AEROBIC AND ANAEROBIC Performed at [facility], [address] [place], [place] [address]    Culture  Setup Time   Final    GRAM POSITIVE COCCI BOTH ANAEROBIC AND AEROBIC BOTTLES Gram Stain Report Called to,Read Back By and Verified With: GANTT,E@1419  BY MATTHEWS, B 5.1.22 Organism ID to follow CRITICAL RESULT CALLED TO, READ BACK BY AND VERIFIED WITH: N,ONDARA RN @2130  05/13/20 AH  Performed at [facility], [place] [address] [place], [place] [address]    Culture STAPHYLOCOCCUS AUREUS (A)  Final   Report Status 05/15/2020 FINAL  Final   Organism ID, Bacteria STAPHYLOCOCCUS AUREUS  Final      Susceptibility   Staphylococcus aureus - MIC*    CIPROFLOXACIN >=8 RESISTANT Resistant     ERYTHROMYCIN >=8 RESISTANT Resistant     GENTAMICIN <=0.5 SENSITIVE Sensitive     OXACILLIN <=0.25  SENSITIVE Sensitive     TETRACYCLINE <=1 SENSITIVE Sensitive     VANCOMYCIN <=0.5 SENSITIVE Sensitive     TRIMETH/SULFA <=10 SENSITIVE Sensitive     CLINDAMYCIN <=0.25 SENSITIVE Sensitive     RIFAMPIN <=0.5 SENSITIVE Sensitive     Inducible Clindamycin NEGATIVE Sensitive     * STAPHYLOCOCCUS AUREUS  Blood Culture ID Panel (Reflexed)     Status: Abnormal   Collection Time: 05/12/20 10:45 PM  Result Value Ref Range Status   Enterococcus faecalis NOT DETECTED NOT DETECTED Final   Enterococcus Faecium NOT DETECTED NOT DETECTED Final   Listeria monocytogenes NOT DETECTED NOT DETECTED Final   Staphylococcus species DETECTED (A) NOT DETECTED Final    Comment: CRITICAL RESULT CALLED TO, READ BACK BY AND VERIFIED WITH: NACKSON ONDARA,RN AT 2132 05/13/2020 A.HUGHES    Staphylococcus aureus (BCID) DETECTED (A) NOT DETECTED Final    Comment: CRITICAL RESULT CALLED TO, READ BACK BY AND VERIFIED WITH: Valentino Hue, RN AT  2132 05/13/2020 A.HUGHES    Staphylococcus epidermidis NOT DETECTED NOT DETECTED Final   Staphylococcus lugdunensis NOT DETECTED NOT DETECTED Final   Streptococcus species NOT DETECTED NOT DETECTED Final   Streptococcus agalactiae NOT DETECTED NOT DETECTED Final   Streptococcus pneumoniae NOT DETECTED NOT DETECTED Final   Streptococcus pyogenes NOT DETECTED NOT DETECTED Final   A.calcoaceticus-baumannii NOT DETECTED NOT DETECTED Final   Bacteroides fragilis NOT DETECTED NOT DETECTED Final   Enterobacterales NOT DETECTED NOT DETECTED Final   Enterobacter cloacae complex NOT DETECTED NOT DETECTED Final   Escherichia coli NOT DETECTED NOT DETECTED Final   Klebsiella aerogenes NOT DETECTED NOT DETECTED Final   Klebsiella oxytoca NOT DETECTED NOT DETECTED Final   Klebsiella pneumoniae NOT DETECTED NOT DETECTED Final   Proteus species NOT DETECTED NOT DETECTED Final   Salmonella species NOT DETECTED NOT DETECTED Final   Serratia marcescens NOT DETECTED NOT DETECTED Final    Haemophilus influenzae NOT DETECTED NOT DETECTED Final   Neisseria meningitidis NOT DETECTED NOT DETECTED Final   Pseudomonas aeruginosa NOT DETECTED NOT DETECTED Final   Stenotrophomonas maltophilia NOT DETECTED NOT DETECTED Final   Candida albicans NOT DETECTED NOT DETECTED Final   Candida auris NOT DETECTED NOT DETECTED Final   Candida glabrata NOT DETECTED NOT DETECTED Final   Candida krusei NOT DETECTED NOT DETECTED Final   Candida parapsilosis NOT DETECTED NOT DETECTED Final   Candida tropicalis NOT DETECTED NOT DETECTED Final   Cryptococcus neoformans/gattii NOT DETECTED  NOT DETECTED Final   Meth resistant mecA/C and MREJ NOT DETECTED NOT DETECTED Final    Comment: Performed at [facility], [place] [address], [place], [place] [address]  Resp Panel by RT-PCR (Flu A&B, Covid) Nasopharyngeal Swab     Status: None   Collection Time: 05/12/20 10:46 PM   Specimen: Nasopharyngeal Swab; Nasopharyngeal(NP) swabs in vial transport medium  Result Value Ref Range Status   SARS Coronavirus 2 by RT PCR NEGATIVE NEGATIVE Final    Comment: (NOTE) SARS-CoV-2 target nucleic acids are NOT DETECTED.  The SARS-CoV-2 RNA is generally detectable in upper respiratory specimens during the acute phase of infection. The lowest concentration of SARS-CoV-2 viral copies this assay can detect is 138 copies/mL. A negative result does not preclude SARS-Cov-2 infection and should not be used as the sole basis for treatment or other patient management decisions. A negative result may occur with  improper specimen collection/handling, submission of specimen other than nasopharyngeal swab, presence of viral mutation(s) within the areas targeted by this assay, and inadequate number of viral copies(<138 copies/mL). A negative result must be combined with clinical observations, patient history, and epidemiological information. The expected result is Negative.  Fact Sheet for Patients:   EntrepreneurPulse.com.au  Fact Sheet for Healthcare Providers:  IncredibleEmployment.be  This test is no t yet approved or cleared by the [place] FDA and  has been authorized for detection and/or diagnosis of SARS-CoV-2 by FDA under an Emergency Use Authorization (EUA). This EUA will remain  in effect (meaning this test can be used) for the duration of the COVID-19 declaration under Section 564(b)(1) of the Act, 21 U.S.C.section 360bbb-3(b)(1), unless the authorization is terminated  or revoked sooner.       Influenza A by PCR NEGATIVE NEGATIVE Final   Influenza B by PCR NEGATIVE NEGATIVE Final    Comment: (NOTE) The Xpert Xpress SARS-CoV-2/FLU/RSV plus assay is intended as an aid in the diagnosis of influenza from Nasopharyngeal swab specimens and should not be used as a sole basis for treatment. Nasal washings and aspirates are unacceptable for Xpert Xpress SARS-CoV-2/FLU/RSV testing.  Fact Sheet for Patients: EntrepreneurPulse.com.au  Fact Sheet for Healthcare Providers: IncredibleEmployment.be  This test is not yet approved or cleared by the [place] FDA and has been authorized for detection and/or diagnosis of SARS-CoV-2 by FDA under an Emergency Use Authorization (EUA). This EUA will remain in effect (meaning this test can be used) for the duration of the COVID-19 declaration under Section 564(b)(1) of the Act, 21 U.S.C. section 360bbb-3(b)(1), unless the authorization is terminated or revoked.  Performed at [facility], [address] [place], [place] [address]   Culture, blood (routine x 2)     Status: Abnormal   Collection Time: 05/12/20 11:10 PM   Specimen: BLOOD RIGHT HAND  Result Value Ref Range Status   Specimen Description   Final    BLOOD RIGHT HAND Performed at [facility], [address] [place], [place] [address]    Special Requests   Final    Blood Culture adequate  volume BOTTLES DRAWN AEROBIC AND ANAEROBIC Performed at [facility], [address] [place], [place] [address]    Culture  Setup Time   Final    GRAM POSITIVE COCCI ANAEROBIC AND AEROBIC BOTTLES Gram Stain Report Called to,Read Back By and Verified With: GANTT,E@1419  BY MATTHEWS B 5.1.22 CRITICAL VALUE NOTED.  VALUE IS CONSISTENT WITH PREVIOUSLY REPORTED AND CALLED VALUE.    Culture (A)  Final  STAPHYLOCOCCUS AUREUS SUSCEPTIBILITIES PERFORMED ON PREVIOUS CULTURE WITHIN THE LAST 5 DAYS. Performed at [facility], [place] [address] [place], [place] [address]    Report Status 05/15/2020 FINAL  Final  Culture, Urine     Status: Abnormal   Collection Time: 05/13/20  1:41 AM   Specimen: Urine, Clean Catch  Result Value Ref Range Status   Specimen Description   Final    URINE, CLEAN CATCH Performed at [facility], [address] [place], [place] [address]    Special Requests   Final    NONE Performed at [facility], [address], [place], [place] [address]    Culture >=100,000 COLONIES/mL STAPHYLOCOCCUS AUREUS (A)  Final   Report Status 05/16/2020 FINAL  Final   Organism ID, Bacteria STAPHYLOCOCCUS AUREUS (A)  Final      Susceptibility   Staphylococcus aureus - MIC*    CIPROFLOXACIN >=8 RESISTANT Resistant     GENTAMICIN <=0.5 SENSITIVE Sensitive     NITROFURANTOIN <=16 SENSITIVE Sensitive     OXACILLIN <=0.25 SENSITIVE Sensitive     TETRACYCLINE <=1 SENSITIVE Sensitive     VANCOMYCIN <=0.5 SENSITIVE Sensitive     TRIMETH/SULFA <=10 SENSITIVE Sensitive     CLINDAMYCIN <=0.25 SENSITIVE Sensitive     RIFAMPIN <=0.5 SENSITIVE Sensitive     Inducible Clindamycin NEGATIVE Sensitive     * >=100,000 COLONIES/mL STAPHYLOCOCCUS AUREUS  Culture, blood (single)     Status: None (Preliminary result)   Collection Time: 05/14/20 10:08 AM   Specimen: BLOOD  Result Value Ref Range Status   Specimen Description BLOOD BLOOD LEFT HAND  Final   Special Requests   Final    Blood  Culture results may not be optimal due to an inadequate volume of blood received in culture bottles BOTTLES DRAWN AEROBIC AND ANAEROBIC   Culture   Final    NO GROWTH 2 DAYS Performed at [facility], [address] [place], [place] [address]    Report Status PENDING  Incomplete  Body fluid culture w Gram Stain     Status: None (Preliminary result)   Collection Time: 05/14/20 12:16 PM   Specimen: Ascitic; Body Fluid  Result Value Ref Range Status   Specimen Description   Final    ASCITIC Performed at [facility], [address] [place], [place] [address]    Special Requests   Final    NONE Performed at [facility], [address], [place], [place] [address]    Gram Stain NO WBC SEEN NO ORGANISMS SEEN   Final   Culture   Final    NO GROWTH 2 DAYS Performed at [facility], [place] [address] [place], [place] [address]    Report Status PENDING  Incomplete     Scheduled Meds: . chlorhexidine  15 mL Mouth/Throat Once   Or  . mouth rinse  15 mL Mouth Rinse Once  . [MAR Hold] folic acid  1 mg Oral Daily  . [MAR Hold] heparin  5,000 Units Subcutaneous Q8H  . [MAR Hold] lactobacillus  1 g Oral TID WC  . [MAR Hold] lamoTRIgine  150 mg Oral BID  . [MAR Hold] levETIRAcetam  1,000 mg Oral BID   Continuous Infusions: . [MAR Hold]  ceFAZolin (ANCEF) IV Stopped (05/16/20 0159)  . lactated ringers 125 mL/hr at 05/16/20 1003  . lactated ringers      Procedures/Studies: CT HEAD WO CONTRAST  Result Date: 05/13/2020 CLINICAL DATA:  Fall 2 days prior EXAM: CT HEAD  WITHOUT CONTRAST TECHNIQUE: Contiguous axial images were obtained from the base of the skull through the vertex without intravenous contrast. COMPARISON:  CT 02/20/2020 FINDINGS: Brain: No evidence of acute infarction, hemorrhage, hydrocephalus, extra-axial collection, visible mass lesion or mass effect. Vascular: Atherosclerotic calcification of the carotid siphons. No hyperdense vessel. Skull: No calvarial fracture or  suspicious osseous lesion. No scalp swelling or hematoma. Sinuses/Orbits: Paranasal sinuses are predominantly clear. Mastoid air cells appear chronically hypo pneumatized. Middle ear cavities are clear. Debris in the bilateral external auditory canals. Included orbital structures are unremarkable. Other: Asymmetric thickening and calcification in the left parotid gland is a chronic, nonspecific finding, similar to comparison study. IMPRESSION: No acute intracranial abnormality. No calvarial fracture, significant scalp swelling or hematoma. Intracranial atherosclerosis. Asymmetric thickening and calcification in the left parotid gland, a nonspecific, chronic finding unchanged from prior. Can reflect sequela of a chronic inflammatory process. Debris in the external auditory canals, correlate for cerumen impaction. Electronically Signed   By: Lovena Le M.D.   On: 05/13/2020 00:44   CT ABDOMEN PELVIS W CONTRAST  Result Date: 05/13/2020 CLINICAL DATA:  Abdominal pain, fever, right upper quadrant pain EXAM: CT ABDOMEN AND PELVIS WITH CONTRAST TECHNIQUE: Multidetector CT imaging of the abdomen and pelvis was performed using the standard protocol following bolus administration of intravenous contrast. CONTRAST:  155mL OMNIPAQUE IOHEXOL 300 MG/ML  SOLN COMPARISON:  CT 02/12/2020 FINDINGS: Lower chest: Basilar atelectatic changes in the lungs including more bandlike areas of subsegmental atelectasis or scarring. Normal heart size. No pericardial effusion. Hepatobiliary: Heterogeneous hepatic attenuation with a nodular liver surface contour concerning for intrinsic liver disease/cirrhosis. No concerning focal liver lesion. Moderate gallbladder distension. No significant gallbladder wall thickening or pericholecystic inflammation is seen. No visible calcified gallstones or biliary ductal dilatation. Pancreas: Slightly edematous appearance of the pancreatic parenchyma with very faint peripancreatic haze, uniform  enhancement. No pancreatic ductal dilatation. Spleen: Mild splenomegaly.  No concerning focal splenic lesion. Adrenals/Urinary Tract: Normal adrenals. Kidneys enhance symmetrically. Symmetric and uniform excretion. No concerning focal renal lesion. Bilateral renal cortical scarring. Nonobstructing calculi present in both kidneys. No obstructive urolithiasis or hydronephrosis is seen at this time. Bladder is unremarkable for the degree of distention. Stomach/Bowel: Paraesophageal venous collaterals and varices. Some minimal collateralization and varices about the gastric fundus as well. Thickening towards the gastric antrum likely reflecting normal peristalsis. Air and fluid-filled appearance of the duodenum with a normal sweep across the midline abdomen. Slightly edematous appearance diffusely through the small bowel is nonspecific given additional findings above. Moderate right-sided colonic stool burden, particularly within the cecum. Some mild circumferential thickening and mucosal hyperemia about the cecum and ascending colon is nonspecific given hepatic features. No evidence of mechanical obstruction. Appendix is not well visualized. Vascular/Lymphatic: Atherosclerotic calcifications within the abdominal aorta and branch vessels. No aneurysm or ectasia. Paraesophageal and gastric varices with venous collaterals, as above. Splenorenal collateralization. Upper abdominal varices.No enlarged abdominopelvic lymph nodes. Few scattered calcified lymph nodes are present in the abdomen and pelvis. Reproductive: Portion the pelvis obscured by streak artifact. 2.6 cm cystic focus in the left adnexa, unchanged from comparison. No concerning right adnexal lesion. Prior hysterectomy. Other: Increasing edematous changes of the central mesentery. Small volume low-attenuation fluid in the deep pelvis as well as trace perihepatic ascites. Circumferential body wall edema. No bowel containing hernia. No free abdominopelvic air.  Musculoskeletal: Multilevel degenerative changes are present in the imaged portions of the spine. Grade 1 anterolisthesis L4 on L5 without spondylolysis, unchanged from prior. Prior left  hip arthroplasty with chronic bony remodeling and a similar appearance overall to comparison prior. IMPRESSION: Constellation of features suggestive of intrinsic liver disease/cirrhosis with features of portal hypertension including a nodular, heterogeneous liver with paraesophageal and gastric varices, additional upper abdominal venous collateralization, splenorenal shunting and splenomegaly. Interval development of a small volume ascites as well as additional features of at least mild or early developing anasarca with circumferential body wall edema and central mesenteric edema. Some hazy peripancreatic stranding may be present, possibly related to a diffusely edematous appearance of the mesentery though could correlate with lipase. Mildly thickened appearance of the cecum and ascending colon as well as a diffusely edematous appearance of the small bowel is nonspecific in the setting of suspected intrinsic liver disease. Could reflect portal enteropathy/colopathy though should exclude clinical symptoms of enterocolitis. Bilateral nonobstructing nephrolithiasis. No obstructive urolithiasis or hydronephrosis at this time. Bilateral renal cortical scarring. Prior hysterectomy. Stable 2.7 cm left ovarian cyst. Continued stability favoring a benign process. No follow-up imaging recommended. Note: This recommendation does not apply to premenarchal patients and to those with increased risk (genetic, family history, elevated tumor markers or other high-risk factors) of ovarian cancer. Reference: JACR 2020 Feb; 17(2):248-254 Aortic Atherosclerosis (ICD10-I70.0). Electronically Signed   By: Lovena Le M.D.   On: 05/13/2020 00:35   US Abdomen Limited  Result Date: 05/13/2020 CLINICAL DATA:  Right upper quadrant pain. EXAM: ULTRASOUND  ABDOMEN LIMITED RIGHT UPPER QUADRANT COMPARISON:  February 23, 2020 ultrasound.  CT scan May 12, 2020. FINDINGS: Gallbladder: A small amount of sludge is seen in the gallbladder. No stones, Murphy's sign, or wall thickening. A small amount of ascites is identified, including adjacent to the gallbladder. Common bile duct: Diameter: 5 mm Liver: There is a nodular contour in the liver. No focal mass. Portal vein is patent on color Doppler imaging with normal direction of blood flow towards the liver. Other: Mild ascites. IMPRESSION: 1. Nodular contour to the liver suggesting cirrhosis.  Mild ascites. 2. Mild sludge in otherwise normal appearing gallbladder. A small amount of fluid adjacent to the gallbladder is likely due to the ascites. Electronically Signed   By: Dorise Bullion III M.D   On: 05/13/2020 09:06   US Paracentesis  Result Date: 05/14/2020 INDICATION: Fever, abdominal pain, question spontaneous bacterial peritonitis, ascites on CT EXAM: ULTRASOUND GUIDED DIAGNOSTIC PARACENTESIS MEDICATIONS: None COMPLICATIONS: None immediate PROCEDURE: Informed written consent was obtained from the patient after a discussion of the risks, benefits and alternatives to treatment. A timeout was performed prior to the initiation of the procedure. Initial ultrasound scanning demonstrates a small amount of ascites within the right lower abdominal quadrant. The right lower abdomen was prepped and draped in the usual sterile fashion. 1% lidocaine was used for local anesthesia. Following this, a 5 [place] Yueh catheter was introduced. An ultrasound image was saved for documentation purposes. The paracentesis was performed. The catheter was removed and a dressing was applied. The patient tolerated the procedure well without immediate post procedural complication. FINDINGS: A total of approximately 20 mL of clear yellow ascitic fluid was removed. Samples were sent to the laboratory as requested by the clinical team. IMPRESSION:  Successful ultrasound-guided paracentesis yielding 20 mL of peritoneal fluid for diagnostic testing. Electronically Signed   By: Lavonia Dana M.D.   On: 05/14/2020 12:39   DG Chest Portable 1 View  Result Date: 05/12/2020 CLINICAL DATA:  Right sided rib pain. EXAM: PORTABLE CHEST 1 VIEW COMPARISON:  February 12, 2020 FINDINGS: A very mild amount  of linear atelectasis is seen within the right lung base. There is no evidence of acute infiltrate, pleural effusion or pneumothorax. The heart size and mediastinal contours are within normal limits. A chronic seventh right rib fracture is seen. IMPRESSION: 1. Very mild right basilar linear atelectasis. 2. Chronic seventh right rib fracture. Electronically Signed   By: Virgina Norfolk M.D.   On: 05/12/2020 23:16   US ABDOMEN COMPLETE W/ELASTOGRAPHY  Result Date: 05/03/2020 CLINICAL DATA:  Fatty liver EXAM: ULTRASOUND ABDOMEN ULTRASOUND HEPATIC ELASTOGRAPHY TECHNIQUE: Sonography of the upper abdomen was performed. In addition, ultrasound elastography evaluation of the liver was performed. A region of interest was placed within the right lobe of the liver. Following application of a compressive sonographic pulse, tissue compressibility was assessed. Multiple assessments were performed at the selected site. Median tissue compressibility was determined. Previously, hepatic stiffness was assessed by shear wave velocity. Based on recently published Society of Radiologists in Ultrasound consensus article, reporting is now recommended to be performed in the SI units of pressure (kiloPascals) representing hepatic stiffness/elasticity. The obtained result is compared to the published reference standards. (cACLD = compensated Advanced Chronic Liver Disease) COMPARISON:  02/23/2020 FINDINGS: ULTRASOUND ABDOMEN Gallbladder: Gallbladder partially contracted without evidence of gallstones or sonographic Murphy sign. Small amount of internal sludge is present. No wall thickening.  Common bile duct: Diameter: 6 mm upper normal Liver: Echogenic hepatic parenchyma with nodular contour consistent with cirrhosis. No discrete hepatic mass. Portal vein is patent on color Doppler imaging with normal direction of blood flow towards the liver. IVC: Normal appearance Pancreas: Normal appearance Spleen: 11.9 cm length, calculated volume 399 mL.  No focal mass. Right Kidney: Length: 11.9 cm. Normal morphology without mass or hydronephrosis. Left Kidney: Length: 12.0 cm. Normal morphology without mass or hydronephrosis. Abdominal aorta: Normal caliber Other findings: Scattered ascites. ULTRASOUND HEPATIC ELASTOGRAPHY Device: Siemens Helix VTQ Patient position: Not recorded Transducer 5C1 Number of measurements: 10 Hepatic segment:  8 Median kPa: 95.8 IQR: 11.6 IQR/Median kPa ratio: 0.1 Data quality:  Good Diagnostic category: > or =17 kPa: highly suggestive of cACLD with an increased probability of clinically significant portal hypertension The use of hepatic elastography is applicable to patients with viral hepatitis and non-alcoholic fatty liver disease. At this time, there is insufficient data for the referenced cut-off values and use in other causes of liver disease, including alcoholic liver disease. Patients, however, may be assessed by elastography and serve as their own reference standard/baseline. In patients with non-alcoholic liver disease, the values suggesting compensated advanced chronic liver disease (cACLD) may be lower, and patients may need additional testing with elasticity results of 7-9 kPa. Please note that abnormal hepatic elasticity and shear wave velocities may also be identified in clinical settings other than with hepatic fibrosis, such as: acute hepatitis, elevated right heart and central venous pressures including use of beta blockers, veno-occlusive disease (Budd-Chiari), infiltrative processes such as mastocytosis/amyloidosis/infiltrative tumor/lymphoma, extrahepatic  cholestasis, with hyperemia in the post-prandial state, and with liver transplantation. Correlation with patient history, laboratory data, and clinical condition recommended. Diagnostic Categories: < or =5 kPa: high probability of being normal < or =9 kPa: in the absence of other known clinical signs, rules out cACLD >9 kPa and ?13 kPa: suggestive of cACLD, but needs further testing >13 kPa: highly suggestive of cACLD > or =17 kPa: highly suggestive of cACLD with an increased probability of clinically significant portal hypertension IMPRESSION: ULTRASOUND ABDOMEN: Cirrhotic appearing liver without focal mass. ULTRASOUND HEPATIC ELASTOGRAPHY: Median kPa:  95.8 Diagnostic category: >  or =17 kPa: highly suggestive of cACLD with an increased probability of clinically significant portal hypertension Electronically Signed   By: Ulyses SouthwardMark  Boles M.D.   On: 05/03/2020 17:09   ECHOCARDIOGRAM COMPLETE  Result Date: 05/15/2020    ECHOCARDIOGRAM REPORT   Patient Name:   Monica SageSARAH A Guerrini Date of Exam: 05/15/2020 Medical Rec #:  [id number]       Height:       60.0 in Accession #:    [id number]      Weight:       127.9 lb Date of Birth:  07/23/1965        BSA:          1.543 m Patient Age:    54 years        BP:           88/64 mmHg Patient Gender: F               HR:           95 bpm. [facility] Procedure: 2D Echo, Cardiac Doppler and Color Doppler Indications:    Bacteremia R78.81  History:        Patient has prior history of Echocardiogram examinations, most                 recent 02/23/2020. Risk Factors:Dyslipidemia. Cardiac Arrest,                 Elevated Liver Function Test.  Sonographer:    Celesta GentileBernard White RCS Referring Phys: [id number] Ellsworth LennoxBRITTANY M STRADER IMPRESSIONS  1. Left ventricular ejection fraction, by estimation, is 60 to 65%. The left ventricle has normal function. The left ventricle has no regional wall motion abnormalities. Left ventricular diastolic parameters are indeterminate.  2. Right ventricular  systolic function is normal. The right ventricular size is normal.  3. Left atrial size was moderately dilated.  4. The mitral valve is normal in structure. No evidence of mitral valve regurgitation. No evidence of mitral stenosis.  5. The aortic valve has an indeterminant number of cusps. Aortic valve regurgitation is not visualized. No aortic stenosis is present.  6. The inferior vena cava is normal in size with greater than 50% respiratory variability, suggesting right atrial pressure of 3 mmHg. FINDINGS  Left Ventricle: Left ventricular ejection fraction, by estimation, is 60 to 65%. The left ventricle has normal function. The left ventricle has no regional wall motion abnormalities. The left ventricular internal cavity size was normal in size. There is  no left ventricular hypertrophy. Left ventricular diastolic parameters are indeterminate. Right Ventricle: The right ventricular size is normal. No increase in right ventricular wall thickness. Right ventricular systolic function is normal. Left Atrium: Left atrial size was moderately dilated. Right Atrium: Right atrial size was not well visualized. Pericardium: There is no evidence of pericardial effusion. Mitral Valve: The mitral valve is normal in structure. No evidence of mitral valve regurgitation. No evidence of mitral valve stenosis. Tricuspid Valve: The tricuspid valve is normal in structure. Tricuspid valve regurgitation is not demonstrated. No evidence of tricuspid stenosis. Aortic Valve: The aortic valve has an indeterminant number of cusps. Aortic valve regurgitation is not visualized. No aortic stenosis is present. Aortic valve mean gradient measures 5.1 mmHg. Aortic valve peak gradient measures 8.2 mmHg. Aortic valve area, by VTI measures 2.13 cm. Pulmonic Valve: The pulmonic valve was not well visualized. Pulmonic valve regurgitation is not visualized. No evidence of pulmonic stenosis. Aorta: The aortic root is normal  in size and structure.  Pulmonary Artery: Inadequate TR jet, indeterminant PASP. Venous: The inferior vena cava is normal in size with greater than 50% respiratory variability, suggesting right atrial pressure of 3 mmHg. IAS/Shunts: No atrial level shunt detected by color flow Doppler.  LEFT VENTRICLE PLAX 2D LVIDd:         4.80 cm  Diastology LVIDs:         3.10 cm  LV e' medial:    10.60 cm/s LV PW:         1.00 cm  LV E/e' medial:  11.3 LV IVS:        1.00 cm  LV e' lateral:   9.14 cm/s LVOT diam:     1.80 cm  LV E/e' lateral: 13.1 LV SV:         64 LV SV Index:   41 LVOT Area:     2.54 cm  RIGHT VENTRICLE RV S prime:     16.30 cm/s TAPSE (M-mode): 2.6 cm LEFT ATRIUM             Index       RIGHT ATRIUM           Index LA diam:        4.20 cm 2.72 cm/m  RA Area:     14.20 cm LA Vol (A2C):   53.8 ml 34.86 ml/m RA Volume:   36.10 ml  23.39 ml/m LA Vol (A4C):   64.2 ml 41.60 ml/m LA Biplane Vol: 62.5 ml 40.49 ml/m  AORTIC VALVE AV Area (Vmax):    2.40 cm AV Area (Vmean):   2.00 cm AV Area (VTI):     2.13 cm AV Vmax:           143.34 cm/s AV Vmean:          109.425 cm/s AV VTI:            0.301 m AV Peak Grad:      8.2 mmHg AV Mean Grad:      5.1 mmHg LVOT Vmax:         135.00 cm/s LVOT Vmean:        86.200 cm/s LVOT VTI:          0.251 m LVOT/AV VTI ratio: 0.84  AORTA Ao Root diam: 3.00 cm MITRAL VALVE MV Area (PHT): 3.97 cm     SHUNTS MV Decel Time: 191 msec     Systemic VTI:  0.25 m MV E velocity: 120.00 cm/s  Systemic Diam: 1.80 cm MV A velocity: 87.30 cm/s MV E/A ratio:  1.37 Carlyle Dolly MD Electronically signed by Carlyle Dolly MD Signature Date/Time: 05/15/2020/2:16:14 PM    Final    [name] ASCITES (ABDOMEN LIMITED)  Result Date: 05/14/2020 CLINICAL DATA:  Ascites, question sufficient for paracentesis, clinical concern for spontaneous bacterial peritonitis EXAM: LIMITED ABDOMEN ULTRASOUND FOR ASCITES TECHNIQUE: Limited ultrasound survey for ascites was performed in all four abdominal quadrants. COMPARISON:  CT abdomen  and pelvis 05/13/2020 FINDINGS: Small volume ascites identified in the lower quadrants bilaterally. Volume of ascites has probably slightly increased since prior CT. While the volume of fluid is insufficient for therapeutic paracentesis, a small volume of ascites may be obtainable for diagnostic purposes. Diagnostic paracentesis will be attempted. IMPRESSION: Small volume ascites, likely sufficient volume for diagnostic paracentesis. Electronically Signed   By: Lavonia Dana M.D.   On: 05/14/2020 11:11    Orson Eva, DO  [facility]  If 7PM-7AM, please contact night-coverage [email or web address]  Password [email or web address] 05/16/2020, 12:09 PM   LOS: 3 days

## 2020-05-16 NOTE — Anesthesia Preprocedure Evaluation (Signed)
Anesthesia Evaluation  Patient identified by MRN, date of birth, ID band Patient awake    Reviewed: Allergy & Precautions, H&P , NPO status , Patient's Chart, lab work & pertinent test results, reviewed documented beta blocker date and time   Airway Mallampati: II  TM Distance: >3 FB Neck ROM: full    Dental no notable dental hx.    Pulmonary sleep apnea , pneumonia, unresolved,    Pulmonary exam normal breath sounds clear to auscultation       Cardiovascular Exercise Tolerance: Good negative cardio ROS   Rhythm:regular Rate:Normal     Neuro/Psych Seizures -, Well Controlled,  PSYCHIATRIC DISORDERS Anxiety Depression    GI/Hepatic negative GI ROS, Neg liver ROS,   Endo/Other  negative endocrine ROS  Renal/GU negative Renal ROS  negative genitourinary   Musculoskeletal negative musculoskeletal ROS (+)   Abdominal   Peds negative pediatric ROS (+)  Hematology negative hematology ROS (+)   Anesthesia Other Findings   Reproductive/Obstetrics negative OB ROS                             Anesthesia Physical Anesthesia Plan  ASA: IV and emergent  Anesthesia Plan: General   Post-op Pain Management:    Induction:   PONV Risk Score and Plan: Propofol infusion  Airway Management Planned:   Additional Equipment:   Intra-op Plan:   Post-operative Plan:   Informed Consent: I have reviewed the patients History and Physical, chart, labs and discussed the procedure including the risks, benefits and alternatives for the proposed anesthesia with the patient or authorized representative who has indicated his/her understanding and acceptance.     Dental Advisory Given  Plan Discussed with: CRNA  Anesthesia Plan Comments:         Anesthesia Quick Evaluation

## 2020-05-16 NOTE — Progress Notes (Signed)
Alert and oriented to person, place, time and situation with occasional confusion but easily re-oriented. C/o chronic pain to head and back. Medication given with relief. C/o nausea, medication given with relief. Poor appetite noted, didn't eat her supper saying she didn't feel like eating it. Weak and needs 1 person assist with transfers and toileting. Bed alarm in use. Call bell within reach. IV abt infused ordered and tolerated well. Continue plan of care.

## 2020-05-16 NOTE — H&P (Signed)
CV Procedure H&P  Asked by primary team for TEE in setting of MSSA bacteremia. Will plan for TEE with assistance from anesthesiology today. For full medical history please refer to referenced admission H&P below.   Monica Dolly MD   Patient Demographics:    [name], is a 55 y.o. female  MRN: [id number]  DOB - [birth year]  Admit Date - 05/12/2020  Referring MD/NP/PA: Sedonia Small  Outpatient Primary MD for the patient is [name], MD  Patient coming from: Home  Chief complaint- Fall   HPI:    [name]  is a 55 y.o. female, with history of seizures, mood disorder, benign brain tumor, fatty liver, hypercholesterol, and more presents the ED with a chief complaint of fall.  Patient reports that she did prior to presentation.  She reports that her house is being remodeled and she tripped on something and fell forward hit her head on the railing and landed on her right chest.  She reports that she hit the back of her head on the way down.  She had no loss of consciousness.  She has had no change in vision or hearing.  She reports that prior to the fall she did have dizziness, chest pain, nausea, but these are chronic daily symptoms for her.  She did not have palpitations.  Her main complaint at this time is that the right side of her chest hurts.  It is worse with deep breaths and with position changes.  She tried Tylenol at home with no relief.  She does admit to having fever prior to coming to the ED today.  She reports a T-max at home of 103.  At presentation her temperature was 101.  She denies cough.  She admits to urinary frequency but no dysuria or urgency.  She has been constipated but did take a laxative yesterday with adequate response.  She reports she had a UTI a month ago and had antibiotics for it and the symptoms cleared up.  She has no rashes or open sores on her body.  She reports nausea and vomiting x3 today prior to coming in on the ambulance.  There is nonbloody  emesis.  She has no other complaints at this time.  Patient reports that she does not smoke, does not drink, does not use illicit drugs and is vaccinated for COVID.  Patient is full code.  In the ED Temp 101, improved to 100.3, respiratory rate 18-26, blood pressure 111/70, satting as low as 89% on room air CT abdomen shows intrinsic liver disease, ascites, anasarca, hazy pancreatic stranding, thickened cecum and ascending colon. CT head is without acute changes Chest x-ray shows some basilar lingular atelectasis Negative COVID White blood cell count 12.4, hemoglobin 10.4 Chemistry panel shows a transaminitis and elevated T bili Lactic acid is elevated at 2.2 but improved to 1.8 after 1 L bolus EKG shows sinus tachycardia with a heart rate of 108, QTc 490 Cefepime Flagyl and Vanco were started in the ED 1 L bolus as mentioned above and then followed by 150 mL/h fluids Admission requested for further work-up of fever of unknown source mental     Review of systems:    In addition to the HPI above,  Admits to fever No Headache, admits to chronically worsening vision no change in hearing, No problems swallowing food or Liquids,  admits to chest wall pain, no cough or Shortness of Breath, Admits to abdominal pain, nausea, vomiting, constipation No Blood in stool or Urine,  No dysuria, No new skin rashes or bruises, No new joints pains-aches,  No new weakness, tingling, numbness in any extremity, No recent weight gain or loss, No polyuria, polydypsia or polyphagia, No significant Mental Stressors.  All other systems reviewed and are negative.    Past History of the following :        Past Medical History:  Diagnosis Date  . Arthritis    "knees" (04/22/2012)  . Brain tumor (benign) ([place])   . Chronic bronchitis ([place])    "yearly; when the weather changes" (04/22/2012)  . Colon polyp   . Colon polyps    adenomatous and hyperplastic-  . Depression   .  Eczema   . Epilepsy ([place])    "been having them right often here lately" (04/22/2012)  . Fatty liver   . High cholesterol   . History of kidney stones   . Osteoarthritis    [name] 04/22/2012  . Other convulsions 05/21/12   non-epileptic spells  . Restless leg   . Seizures ([place])   . Vertigo            Past Surgical History:  Procedure Laterality Date  . ABDOMINAL HYSTERECTOMY  2001  . BLADDER SUSPENSION    . BUNIONECTOMY Left 2000  . CESAREAN SECTION  1987; 1988  . COLONOSCOPY    . JOINT REPLACEMENT    . MASS EXCISION  10/22/2011   Procedure: EXCISION MASS;  Surgeon: [name], MD;  Location: [facility];  Service: General;  Laterality: Right;  excision right buttock mass  . TOTAL HIP ARTHROPLASTY Left 1993; 1995; 2000  . TOTAL KNEE ARTHROPLASTY Left 05/07/2015   Procedure: TOTAL LEFT KNEE ARTHROPLASTY;  Surgeon: [name], MD;  Location: [facility];  Service: Orthopedics;  Laterality: Left;  . TOTAL KNEE ARTHROPLASTY Right 10/01/2015   Procedure: RIGHT TOTAL KNEE ARTHROPLASTY;  Surgeon: [name], MD;  Location: [facility];  Service: Orthopedics;  Laterality: Right;      Social History:      Social History        Tobacco Use  . Smoking status: Never Smoker  . Smokeless tobacco: Never Used  Substance Use Topics  . Alcohol use: No    Alcohol/week: 0.0 standard drinks    Comment: 12-25-2015 per pt no       Family History :          Family History  Problem Relation Age of Onset  . Cancer Mother   . Depression Father   . Cancer Brother   . Diabetes Brother   . Cancer Maternal Aunt   . Diabetes Maternal Aunt   . Bipolar disorder Son   . Drug abuse Son   . Heart attack Sister 42       during severe illness      Home Medications:          Prior to Admission medications   Medication Sig Start Date End Date Taking? Authorizing Provider  FLUoxetine (PROZAC) 40 MG capsule Take 2 capsules (80 mg total) by  mouth daily. 04/26/20 07/25/20  Norman Clay, MD  folic acid (FOLVITE) 1 MG tablet Take 1 tablet (1 mg total) by mouth daily. 02/24/20   Orson Eva, MD  lamoTRIgine (LAMICTAL) 150 MG tablet Take 1 tablet (150 mg total) by mouth 2 (two) times daily. For mood control Patient taking differently: Take 150 mg by mouth 2 (two) times daily. 12/26/19   Ventura Sellers, MD  levETIRAcetam (KEPPRA) 1000 MG tablet TAKE 1  TABLET BY MOUTH IN THE MORNING AND 1 AT BEDTIME 05/10/20   Vaslow, Acey Lav, MD  LORazepam (ATIVAN) 1 MG tablet Take 1 tablet (1 mg total) by mouth daily as needed for anxiety. 03/26/20   Norman Clay, MD     Allergies:          Allergies  Allergen Reactions  . Acetaminophen Other (See Comments)    Has fatty deposits on liver  . Benadryl [Diphenhydramine Hcl] Other (See Comments)    hyperactivity and seizures  . Codeine Itching and Rash    seizures  . Dilantin [Phenytoin Sodium Extended] Other (See Comments)    Elevated LFT's  . Melatonin Other (See Comments)    seizures  . Tramadol Other (See Comments)    "causes seizures"  . Ultram [Tramadol Hcl] Other (See Comments)    Seizures  . Vimpat [Lacosamide] Other (See Comments)    Severe dizziness  . Mirtazapine Other (See Comments)    Potentiated seizure activity  . Betadine [Povidone Iodine] Rash  . Latex Rash  . Penicillins Itching and Rash    Patient can tolerate cephalosporins  . Sulfa Antibiotics Rash  . Tape Rash and Other (See Comments)    Paper tape please Paper tape please  . Vicodin [Hydrocodone-Acetaminophen] Itching     Physical Exam:   Vitals  Blood pressure 116/75, pulse (!) 103, temperature 99.2 F (37.3 C), resp. rate 18, height 5' (1.524 m), weight 58 kg, SpO2 91 %.  1.  General: Patient lying with head of bed elevated, chronically ill-appearing  2. Psychiatric: Mood and behavior normal for situation, alert and oriented, pleasant and cooperative with  exam  3. Neurologic: Face is symmetric, speech and language are normal, moving all 4 extremities voluntarily, no focal deficit on limited exam  4. HEENMT:  Head is atraumatic, normocephalic, pupils reactive, neck is supple, trachea is midline, mucous membranes mildly dry  5. Respiratory : Lungs are clear to auscultation bilaterally without wheezing, rhonchi, rales, no clubbing, no cyanosis  6. Cardiovascular : Heart rate is tachycardic, rhythm is regular, no murmurs rubs or gallops, no peripheral edema  7. Gastrointestinal:  Abdomen is soft, diffusely tender, hepatomegaly, bowel sounds active, mildly distended  8. Skin:  Skin is warm dry and intact limited exam  9.Musculoskeletal:  Right lower ribs tender to palpation, no calf tenderness, no peripheral edema    Data Review:    CBC Last Labs      Recent Labs  Lab 05/12/20 2232  WBC 12.4*  HGB 10.4*  HCT 30.3*  PLT 214  MCV 91.0  MCH 31.2  MCHC 34.3  RDW 16.8*     ------------------------------------------------------------------------------------------------------------------  Lab Results Last 48 Hours        Results for orders placed or performed during the hospital encounter of 05/12/20 (from the past 48 hour(s))  Lipase, blood     Status: None   Collection Time: 05/12/20 10:32 PM  Result Value Ref Range   Lipase 37 11 - 51 U/L    Comment: Performed at [facility], [address], [place], [place] [address]  Comprehensive metabolic panel     Status: Abnormal   Collection Time: 05/12/20 10:32 PM  Result Value Ref Range   Sodium 134 (L) 135 - 145 mmol/L   Potassium 3.7 3.5 - 5.1 mmol/L   Chloride 104 98 - 111 mmol/L   CO2 21 (L) 22 - 32 mmol/L   Glucose, Bld 109 (H) 70 - 99 mg/dL    Comment:  Glucose reference range applies only to samples taken after fasting for at least 8 hours.   BUN 10 6 - 20 mg/dL   Creatinine, Ser 0.62 0.44 - 1.00 mg/dL   Calcium 8.6 (L) 8.9 - 10.3 mg/dL    Total Protein 6.0 (L) 6.5 - 8.1 g/dL   Albumin 2.9 (L) 3.5 - 5.0 g/dL   AST 138 (H) 15 - 41 U/L   ALT 66 (H) 0 - 44 U/L   Alkaline Phosphatase 165 (H) 38 - 126 U/L   Total Bilirubin 1.9 (H) 0.3 - 1.2 mg/dL   GFR, Estimated >60 >60 mL/min    Comment: (NOTE) Calculated using the CKD-EPI Creatinine Equation (2021)    Anion gap 9 5 - 15    Comment: Performed at [facility], [address] [place], [place] [address]  CBC     Status: Abnormal   Collection Time: 05/12/20 10:32 PM  Result Value Ref Range   WBC 12.4 (H) 4.0 - 10.5 K/uL   RBC 3.33 (L) 3.87 - 5.11 MIL/uL   Hemoglobin 10.4 (L) 12.0 - 15.0 g/dL   HCT 30.3 (L) 36.0 - 46.0 %   MCV 91.0 80.0 - 100.0 fL   MCH 31.2 26.0 - 34.0 pg   MCHC 34.3 30.0 - 36.0 g/dL   RDW 16.8 (H) 11.5 - 15.5 %   Platelets 214 150 - 400 K/uL   nRBC 0.0 0.0 - 0.2 %    Comment: Performed at [facility], [address] [place], [place] [address]  Culture, blood (routine x 2)     Status: None (Preliminary result)   Collection Time: 05/12/20 10:45 PM   Specimen: BLOOD LEFT FOREARM  Result Value Ref Range   Specimen Description BLOOD LEFT FOREARM    Special Requests      Blood Culture results may not be optimal due to an excessive volume of blood received in culture bottles BOTTLES DRAWN AEROBIC AND ANAEROBIC Performed at [facility], [address], [place], [place] [address]    Culture PENDING    Report Status PENDING   Lactic acid, plasma     Status: Abnormal   Collection Time: 05/12/20 10:45 PM  Result Value Ref Range   Lactic Acid, Venous 2.2 (HH) 0.5 - 1.9 mmol/L    Comment: CRITICAL RESULT CALLED TO, READ BACK BY AND VERIFIED WITH: GESELLE,S 2301 05/12/2020 COLEMAN,R Performed at [facility], [address], [place], [place] [address]   Resp Panel by RT-PCR (Flu A&B, Covid) Nasopharyngeal Swab     Status: None   Collection Time: 05/12/20 10:46 PM   Specimen: Nasopharyngeal Swab;  Nasopharyngeal(NP) swabs in vial transport medium  Result Value Ref Range   SARS Coronavirus 2 by RT PCR NEGATIVE NEGATIVE    Comment: (NOTE) SARS-CoV-2 target nucleic acids are NOT DETECTED.  The SARS-CoV-2 RNA is generally detectable in upper respiratory specimens during the acute phase of infection. The lowest concentration of SARS-CoV-2 viral copies this assay can detect is 138 copies/mL. A negative result does not preclude SARS-Cov-2 infection and should not be used as the sole basis for treatment or other patient management decisions. A negative result may occur with  improper specimen collection/handling, submission of specimen other than nasopharyngeal swab, presence of viral mutation(s) within the areas targeted by this assay, and inadequate number of viral copies(<138 copies/mL). A negative result must be combined with clinical observations, patient history, and epidemiological information. The expected result is Negative.  Fact Sheet for Patients:  EntrepreneurPulse.com.au  Fact  Sheet for Healthcare Providers:  IncredibleEmployment.be  This test is no t yet approved or cleared by the [place] FDA and  has been authorized for detection and/or diagnosis of SARS-CoV-2 by FDA under an Emergency Use Authorization (EUA). This EUA will remain  in effect (meaning this test can be used) for the duration of the COVID-19 declaration under Section 564(b)(1) of the Act, 21 U.S.C.section 360bbb-3(b)(1), unless the authorization is terminated  or revoked sooner.       Influenza A by PCR NEGATIVE NEGATIVE   Influenza B by PCR NEGATIVE NEGATIVE    Comment: (NOTE) The Xpert Xpress SARS-CoV-2/FLU/RSV plus assay is intended as an aid in the diagnosis of influenza from Nasopharyngeal swab specimens and should not be used as a sole basis for treatment. Nasal washings and aspirates are unacceptable for Xpert Xpress  SARS-CoV-2/FLU/RSV testing.  Fact Sheet for Patients: EntrepreneurPulse.com.au  Fact Sheet for Healthcare Providers: IncredibleEmployment.be  This test is not yet approved or cleared by the [place] FDA and has been authorized for detection and/or diagnosis of SARS-CoV-2 by FDA under an Emergency Use Authorization (EUA). This EUA will remain in effect (meaning this test can be used) for the duration of the COVID-19 declaration under Section 564(b)(1) of the Act, 21 U.S.C. section 360bbb-3(b)(1), unless the authorization is terminated or revoked.  Performed at [facility], [address], [place], [place] [address]   Culture, blood (routine x 2)     Status: None (Preliminary result)   Collection Time: 05/12/20 11:10 PM   Specimen: BLOOD RIGHT HAND  Result Value Ref Range   Specimen Description BLOOD RIGHT HAND    Special Requests      Blood Culture adequate volume BOTTLES DRAWN AEROBIC AND ANAEROBIC Performed at [facility], [address] [place], [place] [address]    Culture PENDING    Report Status PENDING   Lactic acid, plasma     Status: None   Collection Time: 05/12/20 11:10 PM  Result Value Ref Range   Lactic Acid, Venous 1.8 0.5 - 1.9 mmol/L    Comment: Performed at [facility], [address] [place], [place] [address]  Urinalysis, Routine w reflex microscopic Urine, Clean Catch     Status: Abnormal   Collection Time: 05/13/20  1:41 AM  Result Value Ref Range   Color, Urine YELLOW YELLOW   APPearance CLEAR CLEAR   Specific Gravity, Urine 1.043 (H) 1.005 - 1.030   pH 6.0 5.0 - 8.0   Glucose, UA NEGATIVE NEGATIVE mg/dL   Hgb urine dipstick SMALL (A) NEGATIVE   Bilirubin Urine NEGATIVE NEGATIVE   Ketones, ur NEGATIVE NEGATIVE mg/dL   Protein, ur NEGATIVE NEGATIVE mg/dL   Nitrite POSITIVE (A) NEGATIVE   Leukocytes,Ua NEGATIVE NEGATIVE   RBC / HPF 0-5 0 - 5 RBC/hpf   WBC, UA 6-10 0 - 5  WBC/hpf   Bacteria, UA NONE SEEN NONE SEEN   Squamous Epithelial / LPF 0-5 0 - 5   Mucus PRESENT     Comment: Performed at [facility], [address] [place], [place] [address]      Chemistries  Last Labs      Recent Labs  Lab 05/12/20 2232  NA 134*  K 3.7  CL 104  CO2 21*  GLUCOSE 109*  BUN 10  CREATININE 0.62  CALCIUM 8.6*  AST 138*  ALT 66*  ALKPHOS 165*  BILITOT 1.9*     ------------------------------------------------------------------------------------------------------------------  ------------------------------------------------------------------------------------------------------------------ GFR: Estimated Creatinine Clearance: 64.1 mL/min (by C-G formula  based on SCr of 0.62 mg/dL). Liver Function Tests: Last Labs      Recent Labs  Lab 05/12/20 2232  AST 138*  ALT 66*  ALKPHOS 165*  BILITOT 1.9*  PROT 6.0*  ALBUMIN 2.9*     Last Labs      Recent Labs  Lab 05/12/20 2232  LIPASE 37     Last Labs   No results for input(s): AMMONIA in the last 168 hours.   Coagulation Profile: Last Labs   No results for input(s): INR, PROTIME in the last 168 hours.   Cardiac Enzymes: Last Labs   No results for input(s): CKTOTAL, CKMB, CKMBINDEX, TROPONINI in the last 168 hours.   BNP (last 3 results) Recent Labs (within last 365 days)  No results for input(s): PROBNP in the last 8760 hours.   HbA1C: Recent Labs (last 2 labs)   No results for input(s): HGBA1C in the last 72 hours.   CBG: Last Labs   No results for input(s): GLUCAP in the last 168 hours.   Lipid Profile: Recent Labs (last 2 labs)   No results for input(s): CHOL, HDL, LDLCALC, TRIG, CHOLHDL, LDLDIRECT in the last 72 hours.   Thyroid Function Tests: Recent Labs (last 2 labs)   No results for input(s): TSH, T4TOTAL, FREET4, T3FREE, THYROIDAB in the last 72 hours.   Anemia Panel: Recent Labs (last 2 labs)   No results for input(s): VITAMINB12, FOLATE, FERRITIN,  TIBC, IRON, RETICCTPCT in the last 72 hours.    --------------------------------------------------------------------------------------------------------------- Urine analysis: Labs (Brief)          Component Value Date/Time   COLORURINE YELLOW 05/13/2020 0141   APPEARANCEUR CLEAR 05/13/2020 0141   APPEARANCEUR Clear 01/30/2017 1020   LABSPEC 1.043 (H) 05/13/2020 0141   PHURINE 6.0 05/13/2020 0141   GLUCOSEU NEGATIVE 05/13/2020 0141   HGBUR SMALL (A) 05/13/2020 0141   BILIRUBINUR NEGATIVE 05/13/2020 0141   BILIRUBINUR Negative 01/30/2017 1020   KETONESUR NEGATIVE 05/13/2020 0141   PROTEINUR NEGATIVE 05/13/2020 0141   UROBILINOGEN negative 04/19/2014 1332   UROBILINOGEN 0.2 12/04/2013 1414   NITRITE POSITIVE (A) 05/13/2020 0141   LEUKOCYTESUR NEGATIVE 05/13/2020 0141        Imaging Results:     Imaging Results (Last 48 hours)  CT HEAD WO CONTRAST  Result Date: 05/13/2020 CLINICAL DATA:  Fall 2 days prior EXAM: CT HEAD WITHOUT CONTRAST TECHNIQUE: Contiguous axial images were obtained from the base of the skull through the vertex without intravenous contrast. COMPARISON:  CT 02/20/2020 FINDINGS: Brain: No evidence of acute infarction, hemorrhage, hydrocephalus, extra-axial collection, visible mass lesion or mass effect. Vascular: Atherosclerotic calcification of the carotid siphons. No hyperdense vessel. Skull: No calvarial fracture or suspicious osseous lesion. No scalp swelling or hematoma. Sinuses/Orbits: Paranasal sinuses are predominantly clear. Mastoid air cells appear chronically hypo pneumatized. Middle ear cavities are clear. Debris in the bilateral external auditory canals. Included orbital structures are unremarkable. Other: Asymmetric thickening and calcification in the left parotid gland is a chronic, nonspecific finding, similar to comparison study. IMPRESSION: No acute intracranial abnormality. No calvarial fracture, significant scalp swelling or  hematoma. Intracranial atherosclerosis. Asymmetric thickening and calcification in the left parotid gland, a nonspecific, chronic finding unchanged from prior. Can reflect sequela of a chronic inflammatory process. Debris in the external auditory canals, correlate for cerumen impaction. Electronically Signed   By: Lovena Le M.D.   On: 05/13/2020 00:44   CT ABDOMEN PELVIS W CONTRAST  Result Date: 05/13/2020 CLINICAL DATA:  Abdominal pain,  fever, right upper quadrant pain EXAM: CT ABDOMEN AND PELVIS WITH CONTRAST TECHNIQUE: Multidetector CT imaging of the abdomen and pelvis was performed using the standard protocol following bolus administration of intravenous contrast. CONTRAST:  122m OMNIPAQUE IOHEXOL 300 MG/ML  SOLN COMPARISON:  CT 02/12/2020 FINDINGS: Lower chest: Basilar atelectatic changes in the lungs including more bandlike areas of subsegmental atelectasis or scarring. Normal heart size. No pericardial effusion. Hepatobiliary: Heterogeneous hepatic attenuation with a nodular liver surface contour concerning for intrinsic liver disease/cirrhosis. No concerning focal liver lesion. Moderate gallbladder distension. No significant gallbladder wall thickening or pericholecystic inflammation is seen. No visible calcified gallstones or biliary ductal dilatation. Pancreas: Slightly edematous appearance of the pancreatic parenchyma with very faint peripancreatic haze, uniform enhancement. No pancreatic ductal dilatation. Spleen: Mild splenomegaly.  No concerning focal splenic lesion. Adrenals/Urinary Tract: Normal adrenals. Kidneys enhance symmetrically. Symmetric and uniform excretion. No concerning focal renal lesion. Bilateral renal cortical scarring. Nonobstructing calculi present in both kidneys. No obstructive urolithiasis or hydronephrosis is seen at this time. Bladder is unremarkable for the degree of distention. Stomach/Bowel: Paraesophageal venous collaterals and varices. Some minimal  collateralization and varices about the gastric fundus as well. Thickening towards the gastric antrum likely reflecting normal peristalsis. Air and fluid-filled appearance of the duodenum with a normal sweep across the midline abdomen. Slightly edematous appearance diffusely through the small bowel is nonspecific given additional findings above. Moderate right-sided colonic stool burden, particularly within the cecum. Some mild circumferential thickening and mucosal hyperemia about the cecum and ascending colon is nonspecific given hepatic features. No evidence of mechanical obstruction. Appendix is not well visualized. Vascular/Lymphatic: Atherosclerotic calcifications within the abdominal aorta and Zailee Vallely vessels. No aneurysm or ectasia. Paraesophageal and gastric varices with venous collaterals, as above. Splenorenal collateralization. Upper abdominal varices.No enlarged abdominopelvic lymph nodes. Few scattered calcified lymph nodes are present in the abdomen and pelvis. Reproductive: Portion the pelvis obscured by streak artifact. 2.6 cm cystic focus in the left adnexa, unchanged from comparison. No concerning right adnexal lesion. Prior hysterectomy. Other: Increasing edematous changes of the central mesentery. Small volume low-attenuation fluid in the deep pelvis as well as trace perihepatic ascites. Circumferential body wall edema. No bowel containing hernia. No free abdominopelvic air. Musculoskeletal: Multilevel degenerative changes are present in the imaged portions of the spine. Grade 1 anterolisthesis L4 on L5 without spondylolysis, unchanged from prior. Prior left hip arthroplasty with chronic bony remodeling and a similar appearance overall to comparison prior. IMPRESSION: Constellation of features suggestive of intrinsic liver disease/cirrhosis with features of portal hypertension including a nodular, heterogeneous liver with paraesophageal and gastric varices, additional upper abdominal venous  collateralization, splenorenal shunting and splenomegaly. Interval development of a small volume ascites as well as additional features of at least mild or early developing anasarca with circumferential body wall edema and central mesenteric edema. Some hazy peripancreatic stranding may be present, possibly related to a diffusely edematous appearance of the mesentery though could correlate with lipase. Mildly thickened appearance of the cecum and ascending colon as well as a diffusely edematous appearance of the small bowel is nonspecific in the setting of suspected intrinsic liver disease. Could reflect portal enteropathy/colopathy though should exclude clinical symptoms of enterocolitis. Bilateral nonobstructing nephrolithiasis. No obstructive urolithiasis or hydronephrosis at this time. Bilateral renal cortical scarring. Prior hysterectomy. Stable 2.7 cm left ovarian cyst. Continued stability favoring a benign process. No follow-up imaging recommended. Note: This recommendation does not apply to premenarchal patients and to those with increased risk (genetic, family history, elevated tumor markers  or other high-risk factors) of ovarian cancer. Reference: JACR 2020 Feb; 17(2):248-254 Aortic Atherosclerosis (ICD10-I70.0). Electronically Signed   By: Lovena Le M.D.   On: 05/13/2020 00:35   DG Chest Portable 1 View  Result Date: 05/12/2020 CLINICAL DATA:  Right sided rib pain. EXAM: PORTABLE CHEST 1 VIEW COMPARISON:  February 12, 2020 FINDINGS: A very mild amount of linear atelectasis is seen within the right lung base. There is no evidence of acute infiltrate, pleural effusion or pneumothorax. The heart size and mediastinal contours are within normal limits. A chronic seventh right rib fracture is seen. IMPRESSION: 1. Very mild right basilar linear atelectasis. 2. Chronic seventh right rib fracture. Electronically Signed   By: Virgina Norfolk M.D.   On: 05/12/2020 23:16        Assessment & Plan:     Principal Problem:   SIRS (systemic inflammatory response syndrome) (HCC) Active Problems:   Elevated liver function tests   Fall at home, initial encounter   Rib injury   1. SIRS criteria met 1. Temp 101, respiratory rate 26, white blood cell count 12.4, lactic acid 2.2 2. Chest x-ray shows basilar atelectasis which is expected with the rib injury, but no definite infiltrate 3. UA is pending 4. CT abdomen shows no infectious etiology 5. Blood culture pending 6. Lactic acid improved to 1.8, will not continue to trend 7. Cefepime, Flagyl, vancomycin continued 8. 1 L bolus in ED 9. Continue IV fluids 10. Per sepsis order set cortisol and procalcitonin in the a.m. 2. Elevated liver function tests 1. Has a known history of steatosis, transaminases are elevated today with fever and nausea vomiting 2. Checking acute hepatitis panel 3. CT abdomen does show worsening liver disease when compared to the last -at the end of January 2022 4. Trend in the a.m. 5. Right upper quadrant ultrasound to better characterize in the a.m. 6. Last right upper quadrant ultrasound was February 23, 2020, and showed no abnormality of the liver gallbladder 7. Continue to monitor 3. Fall at home initial encounter 1. Mechanical fall 2. PT eval and treat 4. Rib injury 1. Secondary to fall 2. Incentive spirometer 3. Pain control   DVT Prophylaxis-   Heparin- SCDs  AM Labs Ordered, also please review Full Orders  Family Communication: No family at bedside Code Status: Full  Admission status: ObservationTime spent in minutes : [place] DO

## 2020-05-16 NOTE — Transfer of Care (Signed)
Immediate Anesthesia Transfer of Care Note  Patient: [name]  Procedure(s) Performed: TRANSESOPHAGEAL ECHOCARDIOGRAM (TEE) WITH PROPOFOL (N/A )  Patient Location: PACU  Anesthesia Type:MAC  Level of Consciousness: awake and drowsy  Airway & Oxygen Therapy: Patient Spontanous Breathing and Patient connected to face mask oxygen  Post-op Assessment: Report given to RN and Post -op Vital signs reviewed and stable  Post vital signs: Reviewed and stable  Last Vitals:  Vitals Value Taken Time  BP    Temp    Pulse    Resp    SpO2      Last Pain:  Vitals:   05/16/20 0923  TempSrc:   PainSc: 0-No pain      Patients Stated Pain Goal: 2 (83/29/19 1660)  Complications: No complications documented.

## 2020-05-17 ENCOUNTER — Inpatient Hospital Stay: Payer: Self-pay

## 2020-05-17 DIAGNOSIS — R6520 Severe sepsis without septic shock: Secondary | ICD-10-CM | POA: Diagnosis not present

## 2020-05-17 DIAGNOSIS — R7401 Elevation of levels of liver transaminase levels: Secondary | ICD-10-CM | POA: Diagnosis not present

## 2020-05-17 DIAGNOSIS — J9601 Acute respiratory failure with hypoxia: Secondary | ICD-10-CM | POA: Diagnosis not present

## 2020-05-17 DIAGNOSIS — A4101 Sepsis due to Methicillin susceptible Staphylococcus aureus: Secondary | ICD-10-CM | POA: Diagnosis not present

## 2020-05-17 LAB — COMPREHENSIVE METABOLIC PANEL
ALT: 41 U/L (ref 0–44)
AST: 104 U/L — ABNORMAL HIGH (ref 15–41)
Albumin: 2.3 g/dL — ABNORMAL LOW (ref 3.5–5.0)
Alkaline Phosphatase: 138 U/L — ABNORMAL HIGH (ref 38–126)
Anion gap: 7 (ref 5–15)
BUN: 10 mg/dL (ref 6–20)
CO2: 25 mmol/L (ref 22–32)
Calcium: 7.6 mg/dL — ABNORMAL LOW (ref 8.9–10.3)
Chloride: 104 mmol/L (ref 98–111)
Creatinine, Ser: 0.45 mg/dL (ref 0.44–1.00)
GFR, Estimated: >60 mL/min (ref >60)
Glucose, Bld: 109 mg/dL — ABNORMAL HIGH (ref 70–99)
Potassium: 3.6 mmol/L (ref 3.5–5.1)
Sodium: 136 mmol/L (ref 135–145)
Total Bilirubin: 1.6 mg/dL — ABNORMAL HIGH (ref 0.3–1.2)
Total Protein: 5.1 g/dL — ABNORMAL LOW (ref 6.5–8.1)

## 2020-05-17 LAB — CBC
HCT: 28.9 % — ABNORMAL LOW (ref 36.0–46.0)
Hemoglobin: 9.7 g/dL — ABNORMAL LOW (ref 12.0–15.0)
MCH: 30.1 pg (ref 26.0–34.0)
MCHC: 33.6 g/dL (ref 30.0–36.0)
MCV: 89.8 fL (ref 80.0–100.0)
Platelets: 219 10*3/uL (ref 150–400)
RBC: 3.22 MIL/uL — ABNORMAL LOW (ref 3.87–5.11)
RDW: 16.8 % — ABNORMAL HIGH (ref 11.5–15.5)
WBC: 13.1 10*3/uL — ABNORMAL HIGH (ref 4.0–10.5)
nRBC: 0 % (ref 0.0–0.2)

## 2020-05-17 LAB — BODY FLUID CULTURE W GRAM STAIN
Culture: NO GROWTH
Gram Stain: NONE SEEN

## 2020-05-17 LAB — MRSA PCR SCREENING: MRSA by PCR: NEGATIVE

## 2020-05-17 LAB — MAGNESIUM: Magnesium: 1.9 mg/dL (ref 1.7–2.4)

## 2020-05-17 MED ORDER — ENSURE ENLIVE PO LIQD
237.0000 mL | Freq: Two times a day (BID) | ORAL | Status: DC
  Administered 2020-05-17 – 2020-05-24 (×6): 237 mL via ORAL

## 2020-05-17 MED ORDER — FUROSEMIDE 10 MG/ML IJ SOLN
40.0000 mg | Freq: Every day | INTRAMUSCULAR | Status: DC
  Administered 2020-05-17 – 2020-05-18 (×2): 40 mg via INTRAVENOUS
  Filled 2020-05-17 (×2): qty 4

## 2020-05-17 MED ORDER — CEFAZOLIN IV (FOR PTA / DISCHARGE USE ONLY): 2.0000 g | Freq: Three times a day (TID) | INTRAVENOUS | 0 refills | Status: AC

## 2020-05-17 NOTE — Progress Notes (Addendum)
PROGRESS NOTE  Monica MCCLARAN JAS:[id number] DOB: 09/24/1965 DOA: 05/12/2020 PCP: [name], MD  Brief History:  55 year old female with a history of COPD, meningioma, cardiac arrest December 2020, hepatic steatosis,PNES, cognitive impairment, depression, complex partial seizure presenting with generalized weakness and a fall.  This has been a recurrent problem for the patient.  She was admitted on 2/7 to 02/24/20 with a similar presentation.  At that time she was treated for acute metabolic encephalopathy which was thought to be multifactorial including elevated ammonia, dehydration and dopamine agonist.  She was discharged home with HHPT.  She follow up with [facility], Dr. 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  She underwent liver elastography on 05/03/20 which suggested cACLD with increased probability of clinically significant portal HTN. She presented on 05/12/20 with another mechanical fall.  She reports that her house is being remodeled and she tripped on something and fell forward hit her head on the railing and landed on her right chest. She reports that she hit the back of her head on the way down. She had no loss of consciousness. At presentation her temperature was 101. Blood cultures were done and she was found to have MSSA bacteremia.  Also CT of the abd/pelvis on 05/13/20 showed signs of cirrhosis with portal HTN.  She also had splenomegaly.  THere was also a mildly thickened cecum and ascending colon with diffuse edematous appearance of the sm bowel. She underwent TEE on 05/16/20 after which she remained hypoxic on NRB.  She was then transferred to SDU for further care.  Assessment/Plan: Severe Sepsis -present on admission -presented with fever, tachycardia, tachypnea, elevated lactate -due to MSSA bacteremia -Lactic peaked 2.2>>1.8 -UA 6-10 WBC -CXR--no consolidation -Random cortisol level 17.1  MSSA  Bacteremia -05/12/20 blood culture--positive MSSA -05/14/20 blood cuture--neg to date -continue cefazolin -05/16/20 TEE--no vegetation -PICC line 5/6 if surveillance blood cultures remain neg -plan abx till 5/29  Acute respiratory failure with hypoxia -oxygen saturation 88-90% on NRB in PACU 5/4 -personally reviewed CXR--bilateral interstitial infiltrates -ABG 7.471/32/57/24 (0.8) -schedule lasix 40 mg IV daily -05/15/20 Echo=EF 60-65%, no WMA, indeterminant diastolic  NAFLD/Liver Cirrhosis -05/13/20 CT abd as discussed above -05/14/20 paracentesis--WBC 51, only 20 cc removed  Colonic Wall Thickening -Consult GI -?portal HTN colopathy  Gait instability/frequent falls/dizziness -PT evaluation-->HHPT -CT brain negativeacute findings -No focal deficits on exam -Check echo--EF 60-65%, no WMA  Complex partial seizure/PNES -Continue home dose of Keppra and Lamictal  Depression -Continue fluoxetine 80 mg daily -Continue as needed lorazepam  Meningioma -Patient follows Dr. [name] -s/p fractionated radiosurgery Sept 2020--Dr. [name] -01/18/20 MR brain--mass is stable in size as compared to the brain MRI of 09/13/2019 -no focal deficits on exam      Status is: Inpatient  Remains inpatient appropriate because:Hemodynamically unstable and IV treatments appropriate due to intensity of illness or inability to take PO   Dispo: The patient is from: Home  Anticipated d/c is to: Home  Patient currently is not medically stable to d/c.              Difficult to place patient No        Family Communication: no  Family at bedside  Consultants:  Cardiology/GI  Code Status:  FULL   DVT Prophylaxis:  [place] Heparin    Procedures: As Listed in Progress Note Above  Antibiotics: Cefazolin 5/2>>     Subjective: Patient  denies fevers, chills, headache, chest pain, dyspnea, nausea, vomiting, diarrhea, abdominal pain, dysuria,  hematuria, hematochezia, and melena.   Objective: Vitals:   05/17/20 1100 05/17/20 1120 05/17/20 1200 05/17/20 1300  BP: 96/68  106/73 123/78  Pulse: 82  81 (!) 105  Resp: 16  17 (!) 22  Temp:  97.9 F (36.6 C)    TempSrc:  Oral    SpO2: 96%  94% 94%  Weight:      Height:        Intake/Output Summary (Last 24 hours) at 05/17/2020 1537 Last data filed at 05/17/2020 0500 Gross per 24 hour  Intake 100.4 ml  Output 350 ml  Net -249.6 ml   Weight change:  Exam:   General:  Pt is alert, follows commands appropriately, not in acute distress  HEENT: No icterus, No thrush, No neck mass, [place]/AT  Cardiovascular: RRR, S1/S2, no rubs, no gallops  Respiratory: bibasilar rales. No wheeze  Abdomen: Soft/+BS, non tender, non distended, no guarding  Extremities: 1+ LE edema, No lymphangitis, No petechiae, No rashes, no synovitis   Data Reviewed: I have personally reviewed following labs and imaging studies Basic Metabolic Panel: Recent Labs  Lab 05/12/20 2232 05/13/20 0551 05/14/20 0421 05/16/20 1620 05/17/20 0444  NA 134* 138 134* 134* 136  K 3.7 3.2* 3.2* 3.3* 3.6  CL 104 109 108 104 104  CO2 21* 21* 21* 23 25  GLUCOSE 109* 113* 106* 111* 109*  BUN 10 9 12 9 10   CREATININE 0.62 0.53 0.55 0.50 0.45  CALCIUM 8.6* 7.9* 7.4* 7.5* 7.6*  MG  --  1.4*  --   --  1.9   Liver Function Tests: Recent Labs  Lab 05/12/20 2232 05/13/20 0551 05/14/20 0421 05/16/20 1620 05/17/20 0444  AST 138* 121* 127* 109* 104*  ALT 66* 60* 58* 43 41  ALKPHOS 165* 140* 126 131* 138*  BILITOT 1.9* 1.5* 1.4* 1.8* 1.6*  PROT 6.0* 5.3* 5.3* 5.3* 5.1*  ALBUMIN 2.9* 2.6* 2.5* 2.3* 2.3*   Recent Labs  Lab 05/12/20 2232  LIPASE 37   No results for input(s): AMMONIA in the last 168 hours. Coagulation Profile: Recent Labs  Lab 05/13/20 0551  INR 1.4*   CBC: Recent Labs  Lab 05/12/20 2232 05/13/20 0551 05/14/20 0421 05/16/20 1620 05/17/20 0444  WBC 12.4* 14.4* 10.0 11.2* 13.1*   NEUTROABS  --  12.5*  --   --   --   HGB 10.4* 9.7* 9.4* 10.0* 9.7*  HCT 30.3* 30.0* 28.2* 29.1* 28.9*  MCV 91.0 92.9 92.5 89.3 89.8  PLT 214 164 171 197 219   Cardiac Enzymes: No results for input(s): CKTOTAL, CKMB, CKMBINDEX, TROPONINI in the last 168 hours. BNP: Invalid input(s): POCBNP CBG: No results for input(s): GLUCAP in the last 168 hours. HbA1C: No results for input(s): HGBA1C in the last 72 hours. Urine analysis:    Component Value Date/Time   COLORURINE YELLOW 05/13/2020 0141   APPEARANCEUR CLEAR 05/13/2020 0141   APPEARANCEUR Clear 01/30/2017 1020   LABSPEC 1.043 (H) 05/13/2020 0141   PHURINE 6.0 05/13/2020 0141   GLUCOSEU NEGATIVE 05/13/2020 0141   HGBUR SMALL (A) 05/13/2020 0141   BILIRUBINUR NEGATIVE 05/13/2020 0141   BILIRUBINUR Negative 01/30/2017 1020   KETONESUR NEGATIVE 05/13/2020 0141   PROTEINUR NEGATIVE 05/13/2020 0141   UROBILINOGEN negative 04/19/2014 1332   UROBILINOGEN 0.2 12/04/2013 1414   NITRITE POSITIVE (A) 05/13/2020 0141   LEUKOCYTESUR NEGATIVE 05/13/2020 0141   Sepsis Labs: @LABRCNTIP (procalcitonin:4,lacticidven:4) ) Recent Results (from the  past 240 hour(s))  Culture, blood (routine x 2)     Status: Abnormal   Collection Time: 05/12/20 10:45 PM   Specimen: BLOOD LEFT FOREARM  Result Value Ref Range Status   Specimen Description   Final    BLOOD LEFT FOREARM Performed at [facility], [address] [place], [place] [address]    Special Requests   Final    Blood Culture results may not be optimal due to an excessive volume of blood received in culture bottles BOTTLES DRAWN AEROBIC AND ANAEROBIC Performed at [facility], [address], [place], [place] [address]    Culture  Setup Time   Final    GRAM POSITIVE COCCI BOTH ANAEROBIC AND AEROBIC BOTTLES Gram Stain Report Called to,Read Back By and Verified With: GANTT,E@1419  BY MATTHEWS, B 5.1.22 Organism ID to follow CRITICAL RESULT CALLED TO, READ BACK BY AND VERIFIED  WITH: N,ONDARA RN @2130  05/13/20 AH  Performed at [facility], [place] [address] [place],  [address]    Culture STAPHYLOCOCCUS AUREUS (A)  Final   Report Status 05/15/2020 FINAL  Final   Organism ID, Bacteria STAPHYLOCOCCUS AUREUS  Final      Susceptibility   Staphylococcus aureus - MIC*    CIPROFLOXACIN >=8 RESISTANT Resistant     ERYTHROMYCIN >=8 RESISTANT Resistant     GENTAMICIN <=0.5 SENSITIVE Sensitive     OXACILLIN <=0.25 SENSITIVE Sensitive     TETRACYCLINE <=1 SENSITIVE Sensitive     VANCOMYCIN <=0.5 SENSITIVE Sensitive     TRIMETH/SULFA <=10 SENSITIVE Sensitive     CLINDAMYCIN <=0.25 SENSITIVE Sensitive     RIFAMPIN <=0.5 SENSITIVE Sensitive     Inducible Clindamycin NEGATIVE Sensitive     * STAPHYLOCOCCUS AUREUS  Blood Culture ID Panel (Reflexed)     Status: Abnormal   Collection Time: 05/12/20 10:45 PM  Result Value Ref Range Status   Enterococcus faecalis NOT DETECTED NOT DETECTED Final   Enterococcus Faecium NOT DETECTED NOT DETECTED Final   Listeria monocytogenes NOT DETECTED NOT DETECTED Final   Staphylococcus species DETECTED (A) NOT DETECTED Final    Comment: CRITICAL RESULT CALLED TO, READ BACK BY AND VERIFIED WITH: NACKSON ONDARA,RN AT 2132 05/13/2020 A.HUGHES    Staphylococcus aureus (BCID) DETECTED (A) NOT DETECTED Final    Comment: CRITICAL RESULT CALLED TO, READ BACK BY AND VERIFIED WITH: Valentino Hue, RN AT  2132 05/13/2020 A.HUGHES    Staphylococcus epidermidis NOT DETECTED NOT DETECTED Final   Staphylococcus lugdunensis NOT DETECTED NOT DETECTED Final   Streptococcus species NOT DETECTED NOT DETECTED Final   Streptococcus agalactiae NOT DETECTED NOT DETECTED Final   Streptococcus pneumoniae NOT DETECTED NOT DETECTED Final   Streptococcus pyogenes NOT DETECTED NOT DETECTED Final   A.calcoaceticus-baumannii NOT DETECTED NOT DETECTED Final   Bacteroides fragilis NOT DETECTED NOT DETECTED Final   Enterobacterales NOT DETECTED NOT DETECTED  Final   Enterobacter cloacae complex NOT DETECTED NOT DETECTED Final   Escherichia coli NOT DETECTED NOT DETECTED Final   Klebsiella aerogenes NOT DETECTED NOT DETECTED Final   Klebsiella oxytoca NOT DETECTED NOT DETECTED Final   Klebsiella pneumoniae NOT DETECTED NOT DETECTED Final   Proteus species NOT DETECTED NOT DETECTED Final   Salmonella species NOT DETECTED NOT DETECTED Final   Serratia marcescens NOT DETECTED NOT DETECTED Final   Haemophilus influenzae NOT DETECTED NOT DETECTED Final   Neisseria meningitidis NOT DETECTED NOT DETECTED Final   Pseudomonas aeruginosa NOT DETECTED NOT DETECTED Final   Stenotrophomonas maltophilia NOT DETECTED NOT DETECTED  Final   Candida albicans NOT DETECTED NOT DETECTED Final   Candida auris NOT DETECTED NOT DETECTED Final   Candida glabrata NOT DETECTED NOT DETECTED Final   Candida krusei NOT DETECTED NOT DETECTED Final   Candida parapsilosis NOT DETECTED NOT DETECTED Final   Candida tropicalis NOT DETECTED NOT DETECTED Final   Cryptococcus neoformans/gattii NOT DETECTED NOT DETECTED Final   Meth resistant mecA/C and MREJ NOT DETECTED NOT DETECTED Final    Comment: Performed at [facility], [place] [address], [place], [place] [address]  Resp Panel by RT-PCR (Flu A&B, Covid) Nasopharyngeal Swab     Status: None   Collection Time: 05/12/20 10:46 PM   Specimen: Nasopharyngeal Swab; Nasopharyngeal(NP) swabs in vial transport medium  Result Value Ref Range Status   SARS Coronavirus 2 by RT PCR NEGATIVE NEGATIVE Final    Comment: (NOTE) SARS-CoV-2 target nucleic acids are NOT DETECTED.  The SARS-CoV-2 RNA is generally detectable in upper respiratory specimens during the acute phase of infection. The lowest concentration of SARS-CoV-2 viral copies this assay can detect is 138 copies/mL. A negative result does not preclude SARS-Cov-2 infection and should not be used as the sole basis for treatment or other patient management decisions. A  negative result may occur with  improper specimen collection/handling, submission of specimen other than nasopharyngeal swab, presence of viral mutation(s) within the areas targeted by this assay, and inadequate number of viral copies(<138 copies/mL). A negative result must be combined with clinical observations, patient history, and epidemiological information. The expected result is Negative.  Fact Sheet for Patients:  EntrepreneurPulse.com.au  Fact Sheet for Healthcare Providers:  IncredibleEmployment.be  This test is no t yet approved or cleared by the [place] FDA and  has been authorized for detection and/or diagnosis of SARS-CoV-2 by FDA under an Emergency Use Authorization (EUA). This EUA will remain  in effect (meaning this test can be used) for the duration of the COVID-19 declaration under Section 564(b)(1) of the Act, 21 U.S.C.section 360bbb-3(b)(1), unless the authorization is terminated  or revoked sooner.       Influenza A by PCR NEGATIVE NEGATIVE Final   Influenza B by PCR NEGATIVE NEGATIVE Final    Comment: (NOTE) The Xpert Xpress SARS-CoV-2/FLU/RSV plus assay is intended as an aid in the diagnosis of influenza from Nasopharyngeal swab specimens and should not be used as a sole basis for treatment. Nasal washings and aspirates are unacceptable for Xpert Xpress SARS-CoV-2/FLU/RSV testing.  Fact Sheet for Patients: EntrepreneurPulse.com.au  Fact Sheet for Healthcare Providers: IncredibleEmployment.be  This test is not yet approved or cleared by the [place] FDA and has been authorized for detection and/or diagnosis of SARS-CoV-2 by FDA under an Emergency Use Authorization (EUA). This EUA will remain in effect (meaning this test can be used) for the duration of the COVID-19 declaration under Section 564(b)(1) of the Act, 21 U.S.C. section 360bbb-3(b)(1), unless the authorization  is terminated or revoked.  Performed at [facility], [address] [place], [place] [address]   Culture, blood (routine x 2)     Status: Abnormal   Collection Time: 05/12/20 11:10 PM   Specimen: BLOOD RIGHT HAND  Result Value Ref Range Status   Specimen Description   Final    BLOOD RIGHT HAND Performed at [facility], [address], [place], [place] [address]    Special Requests   Final    Blood Culture adequate volume BOTTLES DRAWN AEROBIC AND ANAEROBIC Performed at [facility], [address],  [place], [place] [address]    Culture  Setup Time   Final    GRAM POSITIVE COCCI ANAEROBIC AND AEROBIC BOTTLES Gram Stain Report Called to,Read Back By and Verified With: GANTT,E@1419  BY MATTHEWS B 5.1.22 CRITICAL VALUE NOTED.  VALUE IS CONSISTENT WITH PREVIOUSLY REPORTED AND CALLED VALUE.    Culture (A)  Final    STAPHYLOCOCCUS AUREUS SUSCEPTIBILITIES PERFORMED ON PREVIOUS CULTURE WITHIN THE LAST 5 DAYS. Performed at [facility], [place] [address], [place], [place] [address]    Report Status 05/15/2020 FINAL  Final  Culture, Urine     Status: Abnormal   Collection Time: 05/13/20  1:41 AM   Specimen: Urine, Clean Catch  Result Value Ref Range Status   Specimen Description   Final    URINE, CLEAN CATCH Performed at [facility], [address] [place], [place] [address]    Special Requests   Final    NONE Performed at [facility], [address], [place], [place] [address]    Culture >=100,000 COLONIES/mL STAPHYLOCOCCUS AUREUS (A)  Final   Report Status 05/16/2020 FINAL  Final   Organism ID, Bacteria STAPHYLOCOCCUS AUREUS (A)  Final      Susceptibility   Staphylococcus aureus - MIC*    CIPROFLOXACIN >=8 RESISTANT Resistant     GENTAMICIN <=0.5 SENSITIVE Sensitive     NITROFURANTOIN <=16 SENSITIVE Sensitive     OXACILLIN <=0.25 SENSITIVE Sensitive     TETRACYCLINE <=1 SENSITIVE Sensitive     VANCOMYCIN <=0.5 SENSITIVE Sensitive     TRIMETH/SULFA <=10 SENSITIVE  Sensitive     CLINDAMYCIN <=0.25 SENSITIVE Sensitive     RIFAMPIN <=0.5 SENSITIVE Sensitive     Inducible Clindamycin NEGATIVE Sensitive     * >=100,000 COLONIES/mL STAPHYLOCOCCUS AUREUS  Culture, blood (single)     Status: None (Preliminary result)   Collection Time: 05/14/20 10:08 AM   Specimen: BLOOD  Result Value Ref Range Status   Specimen Description BLOOD BLOOD LEFT HAND  Final   Special Requests   Final    Blood Culture results may not be optimal due to an inadequate volume of blood received in culture bottles BOTTLES DRAWN AEROBIC AND ANAEROBIC   Culture   Final    NO GROWTH 2 DAYS Performed at [facility], [address], [place], [place] [address]    Report Status PENDING  Incomplete  Body fluid culture w Gram Stain     Status: None (Preliminary result)   Collection Time: 05/14/20 12:16 PM   Specimen: Ascitic; Body Fluid  Result Value Ref Range Status   Specimen Description   Final    ASCITIC Performed at [facility], [address], [place], [place] [address]    Special Requests   Final    NONE Performed at [facility], [address] [place], [place] [address]    Gram Stain NO WBC SEEN NO ORGANISMS SEEN   Final   Culture   Final    NO GROWTH 3 DAYS Performed at [facility], [place] [address], [place],  [address]    Report Status PENDING  Incomplete  MRSA PCR Screening     Status: None   Collection Time: 05/16/20  2:57 PM   Specimen: Nasal Mucosa; Nasopharyngeal  Result Value Ref Range Status   MRSA by PCR NEGATIVE NEGATIVE Final    Comment:        The GeneXpert MRSA Assay (FDA approved for NASAL specimens only), is one component of a comprehensive MRSA colonization surveillance  program. It is not intended to diagnose MRSA infection nor to guide or monitor treatment for MRSA infections. Performed at [facility], [address] [place], [place] [address]      Scheduled Meds: . Chlorhexidine Gluconate Cloth  6 each Topical Daily  .  feeding supplement  237 mL Oral BID BM  . folic acid  1 mg Oral Daily  . furosemide  40 mg Intravenous Daily  . heparin  5,000 Units Subcutaneous Q8H  . lactobacillus  1 g Oral TID WC  . lamoTRIgine  150 mg Oral BID  . levETIRAcetam  1,000 mg Oral BID   Continuous Infusions: .  ceFAZolin (ANCEF) IV 2 g (05/17/20 0825)  . lactated ringers 125 mL/hr at 05/16/20 1003    Procedures/Studies: CT HEAD WO CONTRAST  Result Date: 05/13/2020 CLINICAL DATA:  Fall 2 days prior EXAM: CT HEAD WITHOUT CONTRAST TECHNIQUE: Contiguous axial images were obtained from the base of the skull through the vertex without intravenous contrast. COMPARISON:  CT 02/20/2020 FINDINGS: Brain: No evidence of acute infarction, hemorrhage, hydrocephalus, extra-axial collection, visible mass lesion or mass effect. Vascular: Atherosclerotic calcification of the carotid siphons. No hyperdense vessel. Skull: No calvarial fracture or suspicious osseous lesion. No scalp swelling or hematoma. Sinuses/Orbits: Paranasal sinuses are predominantly clear. Mastoid air cells appear chronically hypo pneumatized. Middle ear cavities are clear. Debris in the bilateral external auditory canals. Included orbital structures are unremarkable. Other: Asymmetric thickening and calcification in the left parotid gland is a chronic, nonspecific finding, similar to comparison study. IMPRESSION: No acute intracranial abnormality. No calvarial fracture, significant scalp swelling or hematoma. Intracranial atherosclerosis. Asymmetric thickening and calcification in the left parotid gland, a nonspecific, chronic finding unchanged from prior. Can reflect sequela of a chronic inflammatory process. Debris in the external auditory canals, correlate for cerumen impaction. Electronically Signed   By: Lovena Le M.D.   On: 05/13/2020 00:44   CT ABDOMEN PELVIS W CONTRAST  Result Date: 05/13/2020 CLINICAL DATA:  Abdominal pain, fever, right upper quadrant pain EXAM: CT  ABDOMEN AND PELVIS WITH CONTRAST TECHNIQUE: Multidetector CT imaging of the abdomen and pelvis was performed using the standard protocol following bolus administration of intravenous contrast. CONTRAST:  139mL OMNIPAQUE IOHEXOL 300 MG/ML  SOLN COMPARISON:  CT 02/12/2020 FINDINGS: Lower chest: Basilar atelectatic changes in the lungs including more bandlike areas of subsegmental atelectasis or scarring. Normal heart size. No pericardial effusion. Hepatobiliary: Heterogeneous hepatic attenuation with a nodular liver surface contour concerning for intrinsic liver disease/cirrhosis. No concerning focal liver lesion. Moderate gallbladder distension. No significant gallbladder wall thickening or pericholecystic inflammation is seen. No visible calcified gallstones or biliary ductal dilatation. Pancreas: Slightly edematous appearance of the pancreatic parenchyma with very faint peripancreatic haze, uniform enhancement. No pancreatic ductal dilatation. Spleen: Mild splenomegaly.  No concerning focal splenic lesion. Adrenals/Urinary Tract: Normal adrenals. Kidneys enhance symmetrically. Symmetric and uniform excretion. No concerning focal renal lesion. Bilateral renal cortical scarring. Nonobstructing calculi present in both kidneys. No obstructive urolithiasis or hydronephrosis is seen at this time. Bladder is unremarkable for the degree of distention. Stomach/Bowel: Paraesophageal venous collaterals and varices. Some minimal collateralization and varices about the gastric fundus as well. Thickening towards the gastric antrum likely reflecting normal peristalsis. Air and fluid-filled appearance of the duodenum with a normal sweep across the midline abdomen. Slightly edematous appearance diffusely through the small bowel is nonspecific given additional findings above. Moderate right-sided colonic stool burden, particularly within the cecum. Some mild circumferential thickening and mucosal hyperemia about the cecum  and  ascending colon is nonspecific given hepatic features. No evidence of mechanical obstruction. Appendix is not well visualized. Vascular/Lymphatic: Atherosclerotic calcifications within the abdominal aorta and branch vessels. No aneurysm or ectasia. Paraesophageal and gastric varices with venous collaterals, as above. Splenorenal collateralization. Upper abdominal varices.No enlarged abdominopelvic lymph nodes. Few scattered calcified lymph nodes are present in the abdomen and pelvis. Reproductive: Portion the pelvis obscured by streak artifact. 2.6 cm cystic focus in the left adnexa, unchanged from comparison. No concerning right adnexal lesion. Prior hysterectomy. Other: Increasing edematous changes of the central mesentery. Small volume low-attenuation fluid in the deep pelvis as well as trace perihepatic ascites. Circumferential body wall edema. No bowel containing hernia. No free abdominopelvic air. Musculoskeletal: Multilevel degenerative changes are present in the imaged portions of the spine. Grade 1 anterolisthesis L4 on L5 without spondylolysis, unchanged from prior. Prior left hip arthroplasty with chronic bony remodeling and a similar appearance overall to comparison prior. IMPRESSION: Constellation of features suggestive of intrinsic liver disease/cirrhosis with features of portal hypertension including a nodular, heterogeneous liver with paraesophageal and gastric varices, additional upper abdominal venous collateralization, splenorenal shunting and splenomegaly. Interval development of a small volume ascites as well as additional features of at least mild or early developing anasarca with circumferential body wall edema and central mesenteric edema. Some hazy peripancreatic stranding may be present, possibly related to a diffusely edematous appearance of the mesentery though could correlate with lipase. Mildly thickened appearance of the cecum and ascending colon as well as a diffusely edematous  appearance of the small bowel is nonspecific in the setting of suspected intrinsic liver disease. Could reflect portal enteropathy/colopathy though should exclude clinical symptoms of enterocolitis. Bilateral nonobstructing nephrolithiasis. No obstructive urolithiasis or hydronephrosis at this time. Bilateral renal cortical scarring. Prior hysterectomy. Stable 2.7 cm left ovarian cyst. Continued stability favoring a benign process. No follow-up imaging recommended. Note: This recommendation does not apply to premenarchal patients and to those with increased risk (genetic, family history, elevated tumor markers or other high-risk factors) of ovarian cancer. Reference: JACR 2020 Feb; 17(2):248-254 Aortic Atherosclerosis (ICD10-I70.0). Electronically Signed   By: Lovena Le M.D.   On: 05/13/2020 00:35   US Abdomen Limited  Result Date: 05/13/2020 CLINICAL DATA:  Right upper quadrant pain. EXAM: ULTRASOUND ABDOMEN LIMITED RIGHT UPPER QUADRANT COMPARISON:  February 23, 2020 ultrasound.  CT scan May 12, 2020. FINDINGS: Gallbladder: A small amount of sludge is seen in the gallbladder. No stones, Murphy's sign, or wall thickening. A small amount of ascites is identified, including adjacent to the gallbladder. Common bile duct: Diameter: 5 mm Liver: There is a nodular contour in the liver. No focal mass. Portal vein is patent on color Doppler imaging with normal direction of blood flow towards the liver. Other: Mild ascites. IMPRESSION: 1. Nodular contour to the liver suggesting cirrhosis.  Mild ascites. 2. Mild sludge in otherwise normal appearing gallbladder. A small amount of fluid adjacent to the gallbladder is likely due to the ascites. Electronically Signed   By: Dorise Bullion III M.D   On: 05/13/2020 09:06   US Paracentesis  Result Date: 05/14/2020 INDICATION: Fever, abdominal pain, question spontaneous bacterial peritonitis, ascites on CT EXAM: ULTRASOUND GUIDED DIAGNOSTIC PARACENTESIS MEDICATIONS: None  COMPLICATIONS: None immediate PROCEDURE: Informed written consent was obtained from the patient after a discussion of the risks, benefits and alternatives to treatment. A timeout was performed prior to the initiation of the procedure. Initial ultrasound scanning demonstrates a small amount of ascites within the right lower  abdominal quadrant. The right lower abdomen was prepped and draped in the usual sterile fashion. 1% lidocaine was used for local anesthesia. Following this, a 5 [place] Yueh catheter was introduced. An ultrasound image was saved for documentation purposes. The paracentesis was performed. The catheter was removed and a dressing was applied. The patient tolerated the procedure well without immediate post procedural complication. FINDINGS: A total of approximately 20 mL of clear yellow ascitic fluid was removed. Samples were sent to the laboratory as requested by the clinical team. IMPRESSION: Successful ultrasound-guided paracentesis yielding 20 mL of peritoneal fluid for diagnostic testing. Electronically Signed   By: Lavonia Dana M.D.   On: 05/14/2020 12:39   DG CHEST PORT 1 VIEW  Result Date: 05/16/2020 CLINICAL DATA:  Shortness of breath. EXAM: PORTABLE CHEST 1 VIEW COMPARISON:  Chest x-ray dated May 12, 2020. FINDINGS: The heart size and mediastinal contours are within normal limits. Prominent diffuse interstitial thickening with bilateral perihilar opacities. Small bilateral pleural effusions. No pneumothorax. No acute osseous abnormality. IMPRESSION: 1. Moderate to severe pulmonary edema with small bilateral pleural effusions. Electronically Signed   By: Titus Dubin M.D.   On: 05/16/2020 12:57   DG Chest Portable 1 View  Result Date: 05/12/2020 CLINICAL DATA:  Right sided rib pain. EXAM: PORTABLE CHEST 1 VIEW COMPARISON:  February 12, 2020 FINDINGS: A very mild amount of linear atelectasis is seen within the right lung base. There is no evidence of acute infiltrate, pleural effusion  or pneumothorax. The heart size and mediastinal contours are within normal limits. A chronic seventh right rib fracture is seen. IMPRESSION: 1. Very mild right basilar linear atelectasis. 2. Chronic seventh right rib fracture. Electronically Signed   By: Virgina Norfolk M.D.   On: 05/12/2020 23:16   US ABDOMEN COMPLETE W/ELASTOGRAPHY  Result Date: 05/03/2020 CLINICAL DATA:  Fatty liver EXAM: ULTRASOUND ABDOMEN ULTRASOUND HEPATIC ELASTOGRAPHY TECHNIQUE: Sonography of the upper abdomen was performed. In addition, ultrasound elastography evaluation of the liver was performed. A region of interest was placed within the right lobe of the liver. Following application of a compressive sonographic pulse, tissue compressibility was assessed. Multiple assessments were performed at the selected site. Median tissue compressibility was determined. Previously, hepatic stiffness was assessed by shear wave velocity. Based on recently published Society of Radiologists in Ultrasound consensus article, reporting is now recommended to be performed in the SI units of pressure (kiloPascals) representing hepatic stiffness/elasticity. The obtained result is compared to the published reference standards. (cACLD = compensated Advanced Chronic Liver Disease) COMPARISON:  02/23/2020 FINDINGS: ULTRASOUND ABDOMEN Gallbladder: Gallbladder partially contracted without evidence of gallstones or sonographic Murphy sign. Small amount of internal sludge is present. No wall thickening. Common bile duct: Diameter: 6 mm upper normal Liver: Echogenic hepatic parenchyma with nodular contour consistent with cirrhosis. No discrete hepatic mass. Portal vein is patent on color Doppler imaging with normal direction of blood flow towards the liver. IVC: Normal appearance Pancreas: Normal appearance Spleen: 11.9 cm length, calculated volume 399 mL.  No focal mass. Right Kidney: Length: 11.9 cm. Normal morphology without mass or hydronephrosis. Left Kidney:  Length: 12.0 cm. Normal morphology without mass or hydronephrosis. Abdominal aorta: Normal caliber Other findings: Scattered ascites. ULTRASOUND HEPATIC ELASTOGRAPHY Device: Siemens Helix VTQ Patient position: Not recorded Transducer 5C1 Number of measurements: 10 Hepatic segment:  8 Median kPa: 95.8 IQR: 11.6 IQR/Median kPa ratio: 0.1 Data quality:  Good Diagnostic category: > or =17 kPa: highly suggestive of cACLD with an increased probability of clinically significant portal  hypertension The use of hepatic elastography is applicable to patients with viral hepatitis and non-alcoholic fatty liver disease. At this time, there is insufficient data for the referenced cut-off values and use in other causes of liver disease, including alcoholic liver disease. Patients, however, may be assessed by elastography and serve as their own reference standard/baseline. In patients with non-alcoholic liver disease, the values suggesting compensated advanced chronic liver disease (cACLD) may be lower, and patients may need additional testing with elasticity results of 7-9 kPa. Please note that abnormal hepatic elasticity and shear wave velocities may also be identified in clinical settings other than with hepatic fibrosis, such as: acute hepatitis, elevated right heart and central venous pressures including use of beta blockers, veno-occlusive disease (Budd-Chiari), infiltrative processes such as mastocytosis/amyloidosis/infiltrative tumor/lymphoma, extrahepatic cholestasis, with hyperemia in the post-prandial state, and with liver transplantation. Correlation with patient history, laboratory data, and clinical condition recommended. Diagnostic Categories: < or =5 kPa: high probability of being normal < or =9 kPa: in the absence of other known clinical signs, rules out cACLD >9 kPa and ?13 kPa: suggestive of cACLD, but needs further testing >13 kPa: highly suggestive of cACLD > or =17 kPa: highly suggestive of cACLD with an  increased probability of clinically significant portal hypertension IMPRESSION: ULTRASOUND ABDOMEN: Cirrhotic appearing liver without focal mass. ULTRASOUND HEPATIC ELASTOGRAPHY: Median kPa:  95.8 Diagnostic category: > or =17 kPa: highly suggestive of cACLD with an increased probability of clinically significant portal hypertension Electronically Signed   By: Lavonia Dana M.D.   On: 05/03/2020 17:09   ECHOCARDIOGRAM COMPLETE  Result Date: 05/15/2020    ECHOCARDIOGRAM REPORT   Patient Name:   DIANCA LECHMAN Date of Exam: 05/15/2020 Medical Rec #:  [id number]       Height:       60.0 in Accession #:    [id number]      Weight:       127.9 lb Date of Birth:  [birth year]        BSA:          1.543 m Patient Age:    16 years        BP:           88/64 mmHg Patient Gender: F               HR:           95 bpm. Exam Location:  [facility] Procedure: 2D Echo, Cardiac Doppler and Color Doppler Indications:    Bacteremia R78.81  History:        Patient has prior history of Echocardiogram examinations, most                 recent 02/23/2020. Risk Factors:Dyslipidemia. Cardiac Arrest,                 Elevated Liver Function Test.  Sonographer:    Alvino Chapel RCS Referring Phys: [id number] [place]  1. Left ventricular ejection fraction, by estimation, is 60 to 65%. The left ventricle has normal function. The left ventricle has no regional wall motion abnormalities. Left ventricular diastolic parameters are indeterminate.  2. Right ventricular systolic function is normal. The right ventricular size is normal.  3. Left atrial size was moderately dilated.  4. The mitral valve is normal in structure. No evidence of mitral valve regurgitation. No evidence of mitral stenosis.  5. The aortic valve has an indeterminant number of cusps. Aortic valve regurgitation is not visualized. No aortic  stenosis is present.  6. The inferior vena cava is normal in size with greater than 50% respiratory variability, suggesting right  atrial pressure of 3 mmHg. FINDINGS  Left Ventricle: Left ventricular ejection fraction, by estimation, is 60 to 65%. The left ventricle has normal function. The left ventricle has no regional wall motion abnormalities. The left ventricular internal cavity size was normal in size. There is  no left ventricular hypertrophy. Left ventricular diastolic parameters are indeterminate. Right Ventricle: The right ventricular size is normal. No increase in right ventricular wall thickness. Right ventricular systolic function is normal. Left Atrium: Left atrial size was moderately dilated. Right Atrium: Right atrial size was not well visualized. Pericardium: There is no evidence of pericardial effusion. Mitral Valve: The mitral valve is normal in structure. No evidence of mitral valve regurgitation. No evidence of mitral valve stenosis. Tricuspid Valve: The tricuspid valve is normal in structure. Tricuspid valve regurgitation is not demonstrated. No evidence of tricuspid stenosis. Aortic Valve: The aortic valve has an indeterminant number of cusps. Aortic valve regurgitation is not visualized. No aortic stenosis is present. Aortic valve mean gradient measures 5.1 mmHg. Aortic valve peak gradient measures 8.2 mmHg. Aortic valve area, by VTI measures 2.13 cm. Pulmonic Valve: The pulmonic valve was not well visualized. Pulmonic valve regurgitation is not visualized. No evidence of pulmonic stenosis. Aorta: The aortic root is normal in size and structure. Pulmonary Artery: Inadequate TR jet, indeterminant PASP. Venous: The inferior vena cava is normal in size with greater than 50% respiratory variability, suggesting right atrial pressure of 3 mmHg. IAS/Shunts: No atrial level shunt detected by color flow Doppler.  LEFT VENTRICLE PLAX 2D LVIDd:         4.80 cm  Diastology LVIDs:         3.10 cm  LV e' medial:    10.60 cm/s LV PW:         1.00 cm  LV E/e' medial:  11.3 LV IVS:        1.00 cm  LV e' lateral:   9.14 cm/s LVOT diam:      1.80 cm  LV E/e' lateral: 13.1 LV SV:         64 LV SV Index:   41 LVOT Area:     2.54 cm  RIGHT VENTRICLE RV S prime:     16.30 cm/s TAPSE (M-mode): 2.6 cm LEFT ATRIUM             Index       RIGHT ATRIUM           Index LA diam:        4.20 cm 2.72 cm/m  RA Area:     14.20 cm LA Vol (A2C):   53.8 ml 34.86 ml/m RA Volume:   36.10 ml  23.39 ml/m LA Vol (A4C):   64.2 ml 41.60 ml/m LA Biplane Vol: 62.5 ml 40.49 ml/m  AORTIC VALVE AV Area (Vmax):    2.40 cm AV Area (Vmean):   2.00 cm AV Area (VTI):     2.13 cm AV Vmax:           143.34 cm/s AV Vmean:          109.425 cm/s AV VTI:            0.301 m AV Peak Grad:      8.2 mmHg AV Mean Grad:      5.1 mmHg LVOT Vmax:         135.00 cm/s  LVOT Vmean:        86.200 cm/s LVOT VTI:          0.251 m LVOT/AV VTI ratio: 0.84  AORTA Ao Root diam: 3.00 cm MITRAL VALVE MV Area (PHT): 3.97 cm     SHUNTS MV Decel Time: 191 msec     Systemic VTI:  0.25 m MV E velocity: 120.00 cm/s  Systemic Diam: 1.80 cm MV A velocity: 87.30 cm/s MV E/A ratio:  1.37 Carlyle Dolly MD Electronically signed by Carlyle Dolly MD Signature Date/Time: 05/15/2020/2:16:14 PM    Final    ECHO TEE  Result Date: 05/16/2020    TRANSESOPHOGEAL ECHO REPORT   Patient Name:   [name] Date of Exam: 05/16/2020 Medical Rec #:  [id number]       Height:       60.0 in Accession #:    [id number]      Weight:       127.9 lb Date of Birth:  16-Aug-1965        BSA:          1.543 m Patient Age:    71 years        BP:           123/85 mmHg Patient Gender: F               HR:           101 bpm. Exam Location:  [facility] Procedure: Transesophageal Echo, Cardiac Doppler and Color Doppler Indications:    Bacteremia  History:        Patient has prior history of Echocardiogram examinations, most                 recent 05/15/2020. Risk Factors:Dyslipidemia. Cardiac Arrest,                 Elevated Liver Function Test.  Sonographer:    Alvino Chapel RCS Referring Phys: [id number] [place]: The  transesophogeal probe was passed without difficulty through the esophogus of the patient. Sedation performed by different physician. The patient developed no complications during the procedure. IMPRESSIONS  1. Left ventricular ejection fraction, by estimation, is 60 to 65%. The left ventricle has normal function.  2. Right ventricular systolic function is normal. The right ventricular size is normal.  3. Left atrial size was mildly dilated. No left atrial/left atrial appendage thrombus was detected. The LAA emptying velocity was 85 cm/s.  4. Right atrial size was mildly dilated.  5. The mitral valve is normal in structure. Trivial mitral valve regurgitation. No evidence of mitral stenosis.  6. The aortic valve is tricuspid. Aortic valve regurgitation is not visualized. No aortic stenosis is present. Conclusion(s)/Recommendation(s): No evidence of vegetation/infective endocarditis on this transesophageal echocardiogram. FINDINGS  Left Ventricle: Left ventricular ejection fraction, by estimation, is 60 to 65%. The left ventricle has normal function. The left ventricular internal cavity size was normal in size. Right Ventricle: The right ventricular size is normal. No increase in right ventricular wall thickness. Right ventricular systolic function is normal. Left Atrium: Left atrial size was mildly dilated. No left atrial/left atrial appendage thrombus was detected. The LAA emptying velocity was 85 cm/s. Right Atrium: Right atrial size was mildly dilated. Pericardium: There is no evidence of pericardial effusion. Mitral Valve: The mitral valve is normal in structure. Trivial mitral valve regurgitation. No evidence of mitral valve stenosis. Tricuspid Valve: The tricuspid valve is normal in structure. Tricuspid valve regurgitation is trivial. No  evidence of tricuspid stenosis. Aortic Valve: The aortic valve is tricuspid. Aortic valve regurgitation is not visualized. No aortic stenosis is present. Pulmonic Valve: The  pulmonic valve was normal in structure. Pulmonic valve regurgitation is not visualized. No evidence of pulmonic stenosis. Aorta: The aortic root is normal in size and structure. IAS/Shunts: No atrial level shunt detected by color flow Doppler. Carlyle Dolly MD Electronically signed by Carlyle Dolly MD Signature Date/Time: 05/16/2020/1:28:24 PM    Final    [name] ASCITES (ABDOMEN LIMITED)  Result Date: 05/14/2020 CLINICAL DATA:  Ascites, question sufficient for paracentesis, clinical concern for spontaneous bacterial peritonitis EXAM: LIMITED ABDOMEN ULTRASOUND FOR ASCITES TECHNIQUE: Limited ultrasound survey for ascites was performed in all four abdominal quadrants. COMPARISON:  CT abdomen and pelvis 05/13/2020 FINDINGS: Small volume ascites identified in the lower quadrants bilaterally. Volume of ascites has probably slightly increased since prior CT. While the volume of fluid is insufficient for therapeutic paracentesis, a small volume of ascites may be obtainable for diagnostic purposes. Diagnostic paracentesis will be attempted. IMPRESSION: Small volume ascites, likely sufficient volume for diagnostic paracentesis. Electronically Signed   By: Lavonia Dana M.D.   On: 05/14/2020 11:11    Orson Eva, DO  [facility]  If 7PM-7AM, please contact night-coverage [email or web address] Password [email or web address] 05/17/2020, 3:37 PM   LOS: 4 days

## 2020-05-17 NOTE — Progress Notes (Signed)
PHARMACY CONSULT NOTE FOR:  OUTPATIENT  PARENTERAL ANTIBIOTIC THERAPY (OPAT)  Indication: MSSA bacteremia  Regimen: Cefazolin 2000 mg IV every 8 hours. End date: Last day of therapy 06/10/2020  IV antibiotic discharge orders are pended. To discharging provider:  please sign these orders via discharge navigator,  Select New Orders & click on the button choice - Manage This Unsigned Work.     Thank you for allowing pharmacy to be a part of this patient's care.  Ramond Craver 05/17/2020, 3:26 PM

## 2020-05-18 DIAGNOSIS — R6520 Severe sepsis without septic shock: Secondary | ICD-10-CM | POA: Diagnosis not present

## 2020-05-18 DIAGNOSIS — G40909 Epilepsy, unspecified, not intractable, without status epilepticus: Secondary | ICD-10-CM

## 2020-05-18 DIAGNOSIS — A4101 Sepsis due to Methicillin susceptible Staphylococcus aureus: Secondary | ICD-10-CM | POA: Diagnosis not present

## 2020-05-18 DIAGNOSIS — R7401 Elevation of levels of liver transaminase levels: Secondary | ICD-10-CM | POA: Diagnosis not present

## 2020-05-18 LAB — BASIC METABOLIC PANEL
Anion gap: 7 (ref 5–15)
BUN: 12 mg/dL (ref 6–20)
CO2: 25 mmol/L (ref 22–32)
Calcium: 7.5 mg/dL — ABNORMAL LOW (ref 8.9–10.3)
Chloride: 103 mmol/L (ref 98–111)
Creatinine, Ser: 0.44 mg/dL (ref 0.44–1.00)
GFR, Estimated: >60 mL/min (ref >60)
Glucose, Bld: 97 mg/dL (ref 70–99)
Potassium: 2.9 mmol/L — ABNORMAL LOW (ref 3.5–5.1)
Sodium: 135 mmol/L (ref 135–145)

## 2020-05-18 LAB — PROCALCITONIN: Procalcitonin: 0.26 ng/mL

## 2020-05-18 LAB — BRAIN NATRIURETIC PEPTIDE: B Natriuretic Peptide: 92 pg/mL (ref 0.0–100.0)

## 2020-05-18 LAB — MAGNESIUM: Magnesium: 1.9 mg/dL (ref 1.7–2.4)

## 2020-05-18 MED ORDER — SODIUM CHLORIDE 0.9% FLUSH
10.0000 mL | INTRAVENOUS | Status: DC | PRN
  Administered 2020-05-21: 10 mL

## 2020-05-18 MED ORDER — SODIUM CHLORIDE 0.9% FLUSH
10.0000 mL | Freq: Two times a day (BID) | INTRAVENOUS | Status: DC
  Administered 2020-05-19 – 2020-05-26 (×13): 10 mL

## 2020-05-18 MED ORDER — POTASSIUM CHLORIDE CRYS ER 20 MEQ PO TBCR
40.0000 meq | EXTENDED_RELEASE_TABLET | Freq: Two times a day (BID) | ORAL | Status: DC
  Administered 2020-05-18 – 2020-05-21 (×8): 40 meq via ORAL
  Filled 2020-05-18 (×8): qty 2

## 2020-05-18 MED ORDER — FUROSEMIDE 10 MG/ML IJ SOLN
40.0000 mg | Freq: Two times a day (BID) | INTRAMUSCULAR | Status: DC
  Administered 2020-05-18 – 2020-05-22 (×8): 40 mg via INTRAVENOUS
  Filled 2020-05-18 (×8): qty 4

## 2020-05-18 NOTE — Plan of Care (Signed)
  Problem: Acute Rehab PT Goals(only PT should resolve) Goal: Pt Will Go Supine/Side To Sit Outcome: Progressing Flowsheets (Taken 05/18/2020 1047) Pt will go Supine/Side to Sit: with minimal assist Goal: Patient Will Transfer Sit To/From Stand Outcome: Progressing Flowsheets (Taken 05/18/2020 1047) Patient will transfer sit to/from stand: with minimal assist Goal: Pt Will Transfer Bed To Chair/Chair To Bed Outcome: Progressing Flowsheets (Taken 05/18/2020 1047) Pt will Transfer Bed to Chair/Chair to Bed: with min assist Goal: Pt Will Ambulate Outcome: Progressing Flowsheets (Taken 05/18/2020 1047) Pt will Ambulate:  25 feet  with minimal assist  with moderate assist  with rolling walker   10:47 AM, 05/18/20 Lonell Grandchild, MPT Physical Therapist with [facility] [phone] office [phone] mobile phone

## 2020-05-18 NOTE — Progress Notes (Signed)
PROGRESS NOTE  [name] [id number] DOB: 07/23/1965 DOA: 05/12/2020 PCP: [name], MD Brief History: 55 year old female with a history of COPD, meningioma, cardiac arrest December 2020, hepatic steatosis,PNES, cognitive impairment, depression, complex partial seizure presentingwithgeneralized weakness and a fall.This has been a recurrent problem for the patient. She was admitted on 2/7 to 02/24/20 with a similar presentation. At that time she was treated for acute metabolic encephalopathy which was thought to be multifactorial including elevated ammonia, dehydration and dopamine agonist. She was discharged home with HHPT. She follow up with [facility], Dr. 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 She underwent liver elastography on 05/03/20 which suggested cACLD with increased probability of clinically significant portal HTN. She presented on 05/12/20 with another mechanical fall.She reports that her house is being remodeled and she tripped on something and fell forward hit her head on the railing and landed on her right chest. She reports that she hit the back of her head on the way down. She had no loss of consciousness. At presentation her temperature was 101.Blood cultures were done and she was found to have MSSA bacteremia. Also CT of the abd/pelvis on 05/13/20 showed signs of cirrhosis with portal HTN. She also had splenomegaly. THere was also a mildly thickened cecum and ascending colon with diffuse edematous appearance of the sm bowel. She underwent TEE on 05/16/20 after which she remained hypoxic on NRB. She was then transferred to SDU for further care.  Assessment/Plan: Severe Sepsis -present on admission -presented with fever, tachycardia, tachypnea, elevated lactate -due to MSSA bacteremia -Lactic peaked 2.2>>1.8 -UA 6-10 WBC -CXR--no consolidation -Random cortisol level 17.1  MSSA  Bacteremia -05/12/20 blood culture--positive MSSA -05/14/20 blood cuture--neg to date -continue cefazolin -05/16/20 TEE--no vegetation -PICC line 5/6 if surveillance blood cultures remain neg -plan abx till 5/29  Acute respiratory failure with hypoxia -oxygen saturation 88-90% on NRB in PACU 5/4 -personally reviewed CXR--bilateral interstitial infiltrates -ABG 7.471/32/57/24 (0.8) -schedule lasix 40 mg IV--increase to bid -05/15/20 Echo=EF 60-65%, no WMA, indeterminant diastolic  NAFLD/Liver Cirrhosis -05/13/20 CT abd as discussed above -05/14/20 paracentesis--WBC 51, only 20 cc removed  Colonic Wall Thickening -Consult GI -?portal HTN colopathy  Gait instability/frequent falls/dizziness -PT evaluation-->HHPT -CT brain negativeacute findings -No focal deficits on exam -05/15/20 echo--EF 60-65%, no WMA  Complex partial seizure/PNES -Continue home dose of Keppra and Lamictal  Depression -Continue fluoxetine 80 mg daily -Continue as needed lorazepam  Meningioma -Patient follows Dr. [name] -s/p fractionated radiosurgery Sept 2020--Dr. [name] -01/18/20 MR brain--mass is stable in size as compared to the brain MRI of 09/13/2019 -no focal deficits on exam  Hypokalemia -replete      Status is: Inpatient  Remains inpatient appropriate because:Hemodynamically unstable and IV treatments appropriate due to intensity of illness or inability to take PO   Dispo: The patient is from:Home Anticipated d/c is NE:[id number] Patient currently is not medically stable to d/c. Difficult to place patient No        Family Communication:noFamily at bedside  Consultants:Cardiology/GI  Code Status: FULL   DVT Prophylaxis: [place] Heparin    Procedures: As Listed in Progress Note Above  Antibiotics: Cefazolin 5/2>>       Subjective: Patient denies fevers, chills, headache, chest pain, dyspnea, nausea,  vomiting, diarrhea, abdominal pain, dysuria, hematuria, hematochezia, and melena.   Objective: Vitals:   05/18/20 1200 05/18/20 1300 05/18/20 1400 05/18/20 1500  BP: 113/77 96/66  133/83 98/62  Pulse: 94 95 96 92  Resp: (!) 27 (!) 23 15 (!) 21  Temp:      TempSrc:      SpO2: 99% 98% 92% 98%  Weight:      Height:        Intake/Output Summary (Last 24 hours) at 05/18/2020 1725 Last data filed at 05/18/2020 1442 Gross per 24 hour  Intake 900 ml  Output 1225 ml  Net -325 ml   Weight change:  Exam:   General:  Pt is alert, follows commands appropriately, not in acute distress  HEENT: No icterus, No thrush, No neck mass, Bernard/AT  Cardiovascular: RRR, S1/S2, no rubs, no gallops  Respiratory: bibasilar crakles. No wheeze  Abdomen: Soft/+BS, non tender, non distended, no guarding  Extremities: 1+LE edema, No lymphangitis, No petechiae, No rashes, no synovitis   Data Reviewed: I have personally reviewed following labs and imaging studies Basic Metabolic Panel: Recent Labs  Lab 05/13/20 0551 05/14/20 0421 05/16/20 1620 05/17/20 0444 05/18/20 0504  NA 138 134* 134* 136 135  K 3.2* 3.2* 3.3* 3.6 2.9*  CL 109 108 104 104 103  CO2 21* 21* 23 25 25   GLUCOSE 113* 106* 111* 109* 97  BUN 9 12 9 10 12   CREATININE 0.53 0.55 0.50 0.45 0.44  CALCIUM 7.9* 7.4* 7.5* 7.6* 7.5*  MG 1.4*  --   --  1.9 1.9   Liver Function Tests: Recent Labs  Lab 05/12/20 2232 05/13/20 0551 05/14/20 0421 05/16/20 1620 05/17/20 0444  AST 138* 121* 127* 109* 104*  ALT 66* 60* 58* 43 41  ALKPHOS 165* 140* 126 131* 138*  BILITOT 1.9* 1.5* 1.4* 1.8* 1.6*  PROT 6.0* 5.3* 5.3* 5.3* 5.1*  ALBUMIN 2.9* 2.6* 2.5* 2.3* 2.3*   Recent Labs  Lab 05/12/20 2232  LIPASE 37   No results for input(s): AMMONIA in the last 168 hours. Coagulation Profile: Recent Labs  Lab 05/13/20 0551  INR 1.4*   CBC: Recent Labs  Lab 05/12/20 2232 05/13/20 0551 05/14/20 0421 05/16/20 1620 05/17/20 0444  WBC  12.4* 14.4* 10.0 11.2* 13.1*  NEUTROABS  --  12.5*  --   --   --   HGB 10.4* 9.7* 9.4* 10.0* 9.7*  HCT 30.3* 30.0* 28.2* 29.1* 28.9*  MCV 91.0 92.9 92.5 89.3 89.8  PLT 214 164 171 197 219   Cardiac Enzymes: No results for input(s): CKTOTAL, CKMB, CKMBINDEX, TROPONINI in the last 168 hours. BNP: Invalid input(s): POCBNP CBG: No results for input(s): GLUCAP in the last 168 hours. HbA1C: No results for input(s): HGBA1C in the last 72 hours. Urine analysis:    Component Value Date/Time   COLORURINE YELLOW 05/13/2020 0141   APPEARANCEUR CLEAR 05/13/2020 0141   APPEARANCEUR Clear 01/30/2017 1020   LABSPEC 1.043 (H) 05/13/2020 0141   PHURINE 6.0 05/13/2020 0141   GLUCOSEU NEGATIVE 05/13/2020 0141   HGBUR SMALL (A) 05/13/2020 0141   BILIRUBINUR NEGATIVE 05/13/2020 0141   BILIRUBINUR Negative 01/30/2017 1020   KETONESUR NEGATIVE 05/13/2020 0141   PROTEINUR NEGATIVE 05/13/2020 0141   UROBILINOGEN negative 04/19/2014 1332   UROBILINOGEN 0.2 12/04/2013 1414   NITRITE POSITIVE (A) 05/13/2020 0141   LEUKOCYTESUR NEGATIVE 05/13/2020 0141   Sepsis Labs: @LABRCNTIP (procalcitonin:4,lacticidven:4) ) Recent Results (from the past 240 hour(s))  Culture, blood (routine x 2)     Status: Abnormal   Collection Time: 05/12/20 10:45 PM   Specimen: BLOOD LEFT FOREARM  Result Value Ref Range Status   Specimen Description   Final  BLOOD LEFT FOREARM Performed at [facility], [address], [place], [place] [address]    Special Requests   Final    Blood Culture results may not be optimal due to an excessive volume of blood received in culture bottles BOTTLES DRAWN AEROBIC AND ANAEROBIC Performed at [facility], [address], [place], [place] [address]    Culture  Setup Time   Final    GRAM POSITIVE COCCI BOTH ANAEROBIC AND AEROBIC BOTTLES Gram Stain Report Called to,Read Back By and Verified With: GANTT,E@1419  BY MATTHEWS, B 5.1.22 Organism ID to follow CRITICAL RESULT CALLED TO, READ  BACK BY AND VERIFIED WITH: N,ONDARA RN @2130  05/13/20 AH  Performed at [facility], [place] [address] [place], [place] [address]    Culture STAPHYLOCOCCUS AUREUS (A)  Final   Report Status 05/15/2020 FINAL  Final   Organism ID, Bacteria STAPHYLOCOCCUS AUREUS  Final      Susceptibility   Staphylococcus aureus - MIC*    CIPROFLOXACIN >=8 RESISTANT Resistant     ERYTHROMYCIN >=8 RESISTANT Resistant     GENTAMICIN <=0.5 SENSITIVE Sensitive     OXACILLIN <=0.25 SENSITIVE Sensitive     TETRACYCLINE <=1 SENSITIVE Sensitive     VANCOMYCIN <=0.5 SENSITIVE Sensitive     TRIMETH/SULFA <=10 SENSITIVE Sensitive     CLINDAMYCIN <=0.25 SENSITIVE Sensitive     RIFAMPIN <=0.5 SENSITIVE Sensitive     Inducible Clindamycin NEGATIVE Sensitive     * STAPHYLOCOCCUS AUREUS  Blood Culture ID Panel (Reflexed)     Status: Abnormal   Collection Time: 05/12/20 10:45 PM  Result Value Ref Range Status   Enterococcus faecalis NOT DETECTED NOT DETECTED Final   Enterococcus Faecium NOT DETECTED NOT DETECTED Final   Listeria monocytogenes NOT DETECTED NOT DETECTED Final   Staphylococcus species DETECTED (A) NOT DETECTED Final    Comment: CRITICAL RESULT CALLED TO, READ BACK BY AND VERIFIED WITH: NACKSON ONDARA,RN AT 2132 05/13/2020 A.HUGHES    Staphylococcus aureus (BCID) DETECTED (A) NOT DETECTED Final    Comment: CRITICAL RESULT CALLED TO, READ BACK BY AND VERIFIED WITH: Valentino Hue, RN AT  2132 05/13/2020 A.HUGHES    Staphylococcus epidermidis NOT DETECTED NOT DETECTED Final   Staphylococcus lugdunensis NOT DETECTED NOT DETECTED Final   Streptococcus species NOT DETECTED NOT DETECTED Final   Streptococcus agalactiae NOT DETECTED NOT DETECTED Final   Streptococcus pneumoniae NOT DETECTED NOT DETECTED Final   Streptococcus pyogenes NOT DETECTED NOT DETECTED Final   A.calcoaceticus-baumannii NOT DETECTED NOT DETECTED Final   Bacteroides fragilis NOT DETECTED NOT DETECTED Final   Enterobacterales NOT  DETECTED NOT DETECTED Final   Enterobacter cloacae complex NOT DETECTED NOT DETECTED Final   Escherichia coli NOT DETECTED NOT DETECTED Final   Klebsiella aerogenes NOT DETECTED NOT DETECTED Final   Klebsiella oxytoca NOT DETECTED NOT DETECTED Final   Klebsiella pneumoniae NOT DETECTED NOT DETECTED Final   Proteus species NOT DETECTED NOT DETECTED Final   Salmonella species NOT DETECTED NOT DETECTED Final   Serratia marcescens NOT DETECTED NOT DETECTED Final   Haemophilus influenzae NOT DETECTED NOT DETECTED Final   Neisseria meningitidis NOT DETECTED NOT DETECTED Final   Pseudomonas aeruginosa NOT DETECTED NOT DETECTED Final   Stenotrophomonas maltophilia NOT DETECTED NOT DETECTED Final   Candida albicans NOT DETECTED NOT DETECTED Final   Candida auris NOT DETECTED NOT DETECTED Final   Candida glabrata NOT DETECTED NOT DETECTED Final   Candida krusei NOT DETECTED NOT DETECTED Final   Candida parapsilosis NOT DETECTED NOT  DETECTED Final   Candida tropicalis NOT DETECTED NOT DETECTED Final   Cryptococcus neoformans/gattii NOT DETECTED NOT DETECTED Final   Meth resistant mecA/C and MREJ NOT DETECTED NOT DETECTED Final    Comment: Performed at [facility], [place] [address], [place], [place] [address]  Resp Panel by RT-PCR (Flu A&B, Covid) Nasopharyngeal Swab     Status: None   Collection Time: 05/12/20 10:46 PM   Specimen: Nasopharyngeal Swab; Nasopharyngeal(NP) swabs in vial transport medium  Result Value Ref Range Status   SARS Coronavirus 2 by RT PCR NEGATIVE NEGATIVE Final    Comment: (NOTE) SARS-CoV-2 target nucleic acids are NOT DETECTED.  The SARS-CoV-2 RNA is generally detectable in upper respiratory specimens during the acute phase of infection. The lowest concentration of SARS-CoV-2 viral copies this assay can detect is 138 copies/mL. A negative result does not preclude SARS-Cov-2 infection and should not be used as the sole basis for treatment or other patient  management decisions. A negative result may occur with  improper specimen collection/handling, submission of specimen other than nasopharyngeal swab, presence of viral mutation(s) within the areas targeted by this assay, and inadequate number of viral copies(<138 copies/mL). A negative result must be combined with clinical observations, patient history, and epidemiological information. The expected result is Negative.  Fact Sheet for Patients:  EntrepreneurPulse.com.au  Fact Sheet for Healthcare Providers:  IncredibleEmployment.be  This test is no t yet approved or cleared by the [place] FDA and  has been authorized for detection and/or diagnosis of SARS-CoV-2 by FDA under an Emergency Use Authorization (EUA). This EUA will remain  in effect (meaning this test can be used) for the duration of the COVID-19 declaration under Section 564(b)(1) of the Act, 21 U.S.C.section 360bbb-3(b)(1), unless the authorization is terminated  or revoked sooner.       Influenza A by PCR NEGATIVE NEGATIVE Final   Influenza B by PCR NEGATIVE NEGATIVE Final    Comment: (NOTE) The Xpert Xpress SARS-CoV-2/FLU/RSV plus assay is intended as an aid in the diagnosis of influenza from Nasopharyngeal swab specimens and should not be used as a sole basis for treatment. Nasal washings and aspirates are unacceptable for Xpert Xpress SARS-CoV-2/FLU/RSV testing.  Fact Sheet for Patients: EntrepreneurPulse.com.au  Fact Sheet for Healthcare Providers: IncredibleEmployment.be  This test is not yet approved or cleared by the [place] FDA and has been authorized for detection and/or diagnosis of SARS-CoV-2 by FDA under an Emergency Use Authorization (EUA). This EUA will remain in effect (meaning this test can be used) for the duration of the COVID-19 declaration under Section 564(b)(1) of the Act, 21 U.S.C. section 360bbb-3(b)(1),  unless the authorization is terminated or revoked.  Performed at [facility], [address], [place], [place] [address]   Culture, blood (routine x 2)     Status: Abnormal   Collection Time: 05/12/20 11:10 PM   Specimen: BLOOD RIGHT HAND  Result Value Ref Range Status   Specimen Description   Final    BLOOD RIGHT HAND Performed at [facility], [address], [place], [place] [address]    Special Requests   Final    Blood Culture adequate volume BOTTLES DRAWN AEROBIC AND ANAEROBIC Performed at [facility], [address], [place], [place] [address]    Culture  Setup Time   Final    GRAM POSITIVE COCCI ANAEROBIC AND AEROBIC BOTTLES Gram Stain Report Called to,Read Back By and Verified With: GANTT,E@1419  BY MATTHEWS B 5.1.22 CRITICAL VALUE NOTED.  VALUE IS  CONSISTENT WITH PREVIOUSLY REPORTED AND CALLED VALUE.    Culture (A)  Final    STAPHYLOCOCCUS AUREUS SUSCEPTIBILITIES PERFORMED ON PREVIOUS CULTURE WITHIN THE LAST 5 DAYS. Performed at [facility], [place] [address] [place], [place] [address]    Report Status 05/15/2020 FINAL  Final  Culture, Urine     Status: Abnormal   Collection Time: 05/13/20  1:41 AM   Specimen: Urine, Clean Catch  Result Value Ref Range Status   Specimen Description   Final    URINE, CLEAN CATCH Performed at [facility], [address], [place], [place] [address]    Special Requests   Final    NONE Performed at [facility], [address] [place], [place] [address]    Culture >=100,000 COLONIES/mL STAPHYLOCOCCUS AUREUS (A)  Final   Report Status 05/16/2020 FINAL  Final   Organism ID, Bacteria STAPHYLOCOCCUS AUREUS (A)  Final      Susceptibility   Staphylococcus aureus - MIC*    CIPROFLOXACIN >=8 RESISTANT Resistant     GENTAMICIN <=0.5 SENSITIVE Sensitive     NITROFURANTOIN <=16 SENSITIVE Sensitive     OXACILLIN <=0.25 SENSITIVE Sensitive     TETRACYCLINE <=1 SENSITIVE Sensitive     VANCOMYCIN <=0.5 SENSITIVE Sensitive      TRIMETH/SULFA <=10 SENSITIVE Sensitive     CLINDAMYCIN <=0.25 SENSITIVE Sensitive     RIFAMPIN <=0.5 SENSITIVE Sensitive     Inducible Clindamycin NEGATIVE Sensitive     * >=100,000 COLONIES/mL STAPHYLOCOCCUS AUREUS  Culture, blood (single)     Status: None (Preliminary result)   Collection Time: 05/14/20 10:08 AM   Specimen: BLOOD  Result Value Ref Range Status   Specimen Description BLOOD BLOOD LEFT HAND  Final   Special Requests   Final    Blood Culture results may not be optimal due to an inadequate volume of blood received in culture bottles BOTTLES DRAWN AEROBIC AND ANAEROBIC   Culture   Final    NO GROWTH 4 DAYS Performed at [facility], [address] [place], [place] [address]    Report Status PENDING  Incomplete  Body fluid culture w Gram Stain     Status: None   Collection Time: 05/14/20 12:16 PM   Specimen: Ascitic; Body Fluid  Result Value Ref Range Status   Specimen Description   Final    ASCITIC Performed at [facility], [address], [place], [place] [address]    Special Requests   Final    NONE Performed at [facility] [address], [place], [place] [address]    Gram Stain NO WBC SEEN NO ORGANISMS SEEN   Final   Culture   Final    NO GROWTH 3 DAYS Performed at [facility], [place] [address] [place], [place] [address]    Report Status 05/17/2020 FINAL  Final  MRSA PCR Screening     Status: None   Collection Time: 05/16/20  2:57 PM   Specimen: Nasal Mucosa; Nasopharyngeal  Result Value Ref Range Status   MRSA by PCR NEGATIVE NEGATIVE Final    Comment:        The GeneXpert MRSA Assay (FDA approved for NASAL specimens only), is one component of a comprehensive MRSA colonization surveillance program. It is not intended to diagnose MRSA infection nor to guide or monitor treatment for MRSA infections. Performed at [facility], [address], [place], [place] [address]      Scheduled Meds: . Chlorhexidine Gluconate Cloth  6 each Topical  Daily  . feeding supplement  237 mL Oral BID BM  . folic acid  1 mg Oral Daily  . furosemide  40 mg Intravenous BID  . heparin  5,000 Units Subcutaneous Q8H  . lactobacillus  1 g Oral TID WC  . lamoTRIgine  150 mg Oral BID  . levETIRAcetam  1,000 mg Oral BID  . potassium chloride  40 mEq Oral BID  . sodium chloride flush  10-40 mL Intracatheter Q12H   Continuous Infusions: .  ceFAZolin (ANCEF) IV 2 g (05/18/20 0953)  . lactated ringers 125 mL/hr at 05/16/20 1003    Procedures/Studies: CT HEAD WO CONTRAST  Result Date: 05/13/2020 CLINICAL DATA:  Fall 2 days prior EXAM: CT HEAD WITHOUT CONTRAST TECHNIQUE: Contiguous axial images were obtained from the base of the skull through the vertex without intravenous contrast. COMPARISON:  CT 02/20/2020 FINDINGS: Brain: No evidence of acute infarction, hemorrhage, hydrocephalus, extra-axial collection, visible mass lesion or mass effect. Vascular: Atherosclerotic calcification of the carotid siphons. No hyperdense vessel. Skull: No calvarial fracture or suspicious osseous lesion. No scalp swelling or hematoma. Sinuses/Orbits: Paranasal sinuses are predominantly clear. Mastoid air cells appear chronically hypo pneumatized. Middle ear cavities are clear. Debris in the bilateral external auditory canals. Included orbital structures are unremarkable. Other: Asymmetric thickening and calcification in the left parotid gland is a chronic, nonspecific finding, similar to comparison study. IMPRESSION: No acute intracranial abnormality. No calvarial fracture, significant scalp swelling or hematoma. Intracranial atherosclerosis. Asymmetric thickening and calcification in the left parotid gland, a nonspecific, chronic finding unchanged from prior. Can reflect sequela of a chronic inflammatory process. Debris in the external auditory canals, correlate for cerumen impaction. Electronically Signed   By: Lovena Le M.D.   On: 05/13/2020 00:44   CT ABDOMEN PELVIS W  CONTRAST  Result Date: 05/13/2020 CLINICAL DATA:  Abdominal pain, fever, right upper quadrant pain EXAM: CT ABDOMEN AND PELVIS WITH CONTRAST TECHNIQUE: Multidetector CT imaging of the abdomen and pelvis was performed using the standard protocol following bolus administration of intravenous contrast. CONTRAST:  143mL OMNIPAQUE IOHEXOL 300 MG/ML  SOLN COMPARISON:  CT 02/12/2020 FINDINGS: Lower chest: Basilar atelectatic changes in the lungs including more bandlike areas of subsegmental atelectasis or scarring. Normal heart size. No pericardial effusion. Hepatobiliary: Heterogeneous hepatic attenuation with a nodular liver surface contour concerning for intrinsic liver disease/cirrhosis. No concerning focal liver lesion. Moderate gallbladder distension. No significant gallbladder wall thickening or pericholecystic inflammation is seen. No visible calcified gallstones or biliary ductal dilatation. Pancreas: Slightly edematous appearance of the pancreatic parenchyma with very faint peripancreatic haze, uniform enhancement. No pancreatic ductal dilatation. Spleen: Mild splenomegaly.  No concerning focal splenic lesion. Adrenals/Urinary Tract: Normal adrenals. Kidneys enhance symmetrically. Symmetric and uniform excretion. No concerning focal renal lesion. Bilateral renal cortical scarring. Nonobstructing calculi present in both kidneys. No obstructive urolithiasis or hydronephrosis is seen at this time. Bladder is unremarkable for the degree of distention. Stomach/Bowel: Paraesophageal venous collaterals and varices. Some minimal collateralization and varices about the gastric fundus as well. Thickening towards the gastric antrum likely reflecting normal peristalsis. Air and fluid-filled appearance of the duodenum with a normal sweep across the midline abdomen. Slightly edematous appearance diffusely through the small bowel is nonspecific given additional findings above. Moderate right-sided colonic stool burden,  particularly within the cecum. Some mild circumferential thickening and mucosal hyperemia about the cecum and ascending colon is nonspecific given hepatic features. No evidence of mechanical obstruction. Appendix is not well visualized. Vascular/Lymphatic: Atherosclerotic calcifications within the abdominal aorta  and branch vessels. No aneurysm or ectasia. Paraesophageal and gastric varices with venous collaterals, as above. Splenorenal collateralization. Upper abdominal varices.No enlarged abdominopelvic lymph nodes. Few scattered calcified lymph nodes are present in the abdomen and pelvis. Reproductive: Portion the pelvis obscured by streak artifact. 2.6 cm cystic focus in the left adnexa, unchanged from comparison. No concerning right adnexal lesion. Prior hysterectomy. Other: Increasing edematous changes of the central mesentery. Small volume low-attenuation fluid in the deep pelvis as well as trace perihepatic ascites. Circumferential body wall edema. No bowel containing hernia. No free abdominopelvic air. Musculoskeletal: Multilevel degenerative changes are present in the imaged portions of the spine. Grade 1 anterolisthesis L4 on L5 without spondylolysis, unchanged from prior. Prior left hip arthroplasty with chronic bony remodeling and a similar appearance overall to comparison prior. IMPRESSION: Constellation of features suggestive of intrinsic liver disease/cirrhosis with features of portal hypertension including a nodular, heterogeneous liver with paraesophageal and gastric varices, additional upper abdominal venous collateralization, splenorenal shunting and splenomegaly. Interval development of a small volume ascites as well as additional features of at least mild or early developing anasarca with circumferential body wall edema and central mesenteric edema. Some hazy peripancreatic stranding may be present, possibly related to a diffusely edematous appearance of the mesentery though could correlate  with lipase. Mildly thickened appearance of the cecum and ascending colon as well as a diffusely edematous appearance of the small bowel is nonspecific in the setting of suspected intrinsic liver disease. Could reflect portal enteropathy/colopathy though should exclude clinical symptoms of enterocolitis. Bilateral nonobstructing nephrolithiasis. No obstructive urolithiasis or hydronephrosis at this time. Bilateral renal cortical scarring. Prior hysterectomy. Stable 2.7 cm left ovarian cyst. Continued stability favoring a benign process. No follow-up imaging recommended. Note: This recommendation does not apply to premenarchal patients and to those with increased risk (genetic, family history, elevated tumor markers or other high-risk factors) of ovarian cancer. Reference: JACR 2020 Feb; 17(2):248-254 Aortic Atherosclerosis (ICD10-I70.0). Electronically Signed   By: Lovena Le M.D.   On: 05/13/2020 00:35   US Abdomen Limited  Result Date: 05/13/2020 CLINICAL DATA:  Right upper quadrant pain. EXAM: ULTRASOUND ABDOMEN LIMITED RIGHT UPPER QUADRANT COMPARISON:  February 23, 2020 ultrasound.  CT scan May 12, 2020. FINDINGS: Gallbladder: A small amount of sludge is seen in the gallbladder. No stones, Murphy's sign, or wall thickening. A small amount of ascites is identified, including adjacent to the gallbladder. Common bile duct: Diameter: 5 mm Liver: There is a nodular contour in the liver. No focal mass. Portal vein is patent on color Doppler imaging with normal direction of blood flow towards the liver. Other: Mild ascites. IMPRESSION: 1. Nodular contour to the liver suggesting cirrhosis.  Mild ascites. 2. Mild sludge in otherwise normal appearing gallbladder. A small amount of fluid adjacent to the gallbladder is likely due to the ascites. Electronically Signed   By: Dorise Bullion III M.D   On: 05/13/2020 09:06   US Paracentesis  Result Date: 05/14/2020 INDICATION: Fever, abdominal pain, question  spontaneous bacterial peritonitis, ascites on CT EXAM: ULTRASOUND GUIDED DIAGNOSTIC PARACENTESIS MEDICATIONS: None COMPLICATIONS: None immediate PROCEDURE: Informed written consent was obtained from the patient after a discussion of the risks, benefits and alternatives to treatment. A timeout was performed prior to the initiation of the procedure. Initial ultrasound scanning demonstrates a small amount of ascites within the right lower abdominal quadrant. The right lower abdomen was prepped and draped in the usual sterile fashion. 1% lidocaine was used for local anesthesia. Following this, a 5  [place] Yueh catheter was introduced. An ultrasound image was saved for documentation purposes. The paracentesis was performed. The catheter was removed and a dressing was applied. The patient tolerated the procedure well without immediate post procedural complication. FINDINGS: A total of approximately 20 mL of clear yellow ascitic fluid was removed. Samples were sent to the laboratory as requested by the clinical team. IMPRESSION: Successful ultrasound-guided paracentesis yielding 20 mL of peritoneal fluid for diagnostic testing. Electronically Signed   By: Lavonia Dana M.D.   On: 05/14/2020 12:39   DG CHEST PORT 1 VIEW  Result Date: 05/16/2020 CLINICAL DATA:  Shortness of breath. EXAM: PORTABLE CHEST 1 VIEW COMPARISON:  Chest x-ray dated May 12, 2020. FINDINGS: The heart size and mediastinal contours are within normal limits. Prominent diffuse interstitial thickening with bilateral perihilar opacities. Small bilateral pleural effusions. No pneumothorax. No acute osseous abnormality. IMPRESSION: 1. Moderate to severe pulmonary edema with small bilateral pleural effusions. Electronically Signed   By: Titus Dubin M.D.   On: 05/16/2020 12:57   DG Chest Portable 1 View  Result Date: 05/12/2020 CLINICAL DATA:  Right sided rib pain. EXAM: PORTABLE CHEST 1 VIEW COMPARISON:  February 12, 2020 FINDINGS: A very mild amount  of linear atelectasis is seen within the right lung base. There is no evidence of acute infiltrate, pleural effusion or pneumothorax. The heart size and mediastinal contours are within normal limits. A chronic seventh right rib fracture is seen. IMPRESSION: 1. Very mild right basilar linear atelectasis. 2. Chronic seventh right rib fracture. Electronically Signed   By: Virgina Norfolk M.D.   On: 05/12/2020 23:16   US ABDOMEN COMPLETE W/ELASTOGRAPHY  Result Date: 05/03/2020 CLINICAL DATA:  Fatty liver EXAM: ULTRASOUND ABDOMEN ULTRASOUND HEPATIC ELASTOGRAPHY TECHNIQUE: Sonography of the upper abdomen was performed. In addition, ultrasound elastography evaluation of the liver was performed. A region of interest was placed within the right lobe of the liver. Following application of a compressive sonographic pulse, tissue compressibility was assessed. Multiple assessments were performed at the selected site. Median tissue compressibility was determined. Previously, hepatic stiffness was assessed by shear wave velocity. Based on recently published Society of Radiologists in Ultrasound consensus article, reporting is now recommended to be performed in the SI units of pressure (kiloPascals) representing hepatic stiffness/elasticity. The obtained result is compared to the published reference standards. (cACLD = compensated Advanced Chronic Liver Disease) COMPARISON:  02/23/2020 FINDINGS: ULTRASOUND ABDOMEN Gallbladder: Gallbladder partially contracted without evidence of gallstones or sonographic Murphy sign. Small amount of internal sludge is present. No wall thickening. Common bile duct: Diameter: 6 mm upper normal Liver: Echogenic hepatic parenchyma with nodular contour consistent with cirrhosis. No discrete hepatic mass. Portal vein is patent on color Doppler imaging with normal direction of blood flow towards the liver. IVC: Normal appearance Pancreas: Normal appearance Spleen: 11.9 cm length, calculated volume  399 mL.  No focal mass. Right Kidney: Length: 11.9 cm. Normal morphology without mass or hydronephrosis. Left Kidney: Length: 12.0 cm. Normal morphology without mass or hydronephrosis. Abdominal aorta: Normal caliber Other findings: Scattered ascites. ULTRASOUND HEPATIC ELASTOGRAPHY Device: Siemens Helix VTQ Patient position: Not recorded Transducer 5C1 Number of measurements: 10 Hepatic segment:  8 Median kPa: 95.8 IQR: 11.6 IQR/Median kPa ratio: 0.1 Data quality:  Good Diagnostic category: > or =17 kPa: highly suggestive of cACLD with an increased probability of clinically significant portal hypertension The use of hepatic elastography is applicable to patients with viral hepatitis and non-alcoholic fatty liver disease. At this time, there is insufficient data for  the referenced cut-off values and use in other causes of liver disease, including alcoholic liver disease. Patients, however, may be assessed by elastography and serve as their own reference standard/baseline. In patients with non-alcoholic liver disease, the values suggesting compensated advanced chronic liver disease (cACLD) may be lower, and patients may need additional testing with elasticity results of 7-9 kPa. Please note that abnormal hepatic elasticity and shear wave velocities may also be identified in clinical settings other than with hepatic fibrosis, such as: acute hepatitis, elevated right heart and central venous pressures including use of beta blockers, veno-occlusive disease (Budd-Chiari), infiltrative processes such as mastocytosis/amyloidosis/infiltrative tumor/lymphoma, extrahepatic cholestasis, with hyperemia in the post-prandial state, and with liver transplantation. Correlation with patient history, laboratory data, and clinical condition recommended. Diagnostic Categories: < or =5 kPa: high probability of being normal < or =9 kPa: in the absence of other known clinical signs, rules out cACLD >9 kPa and ?13 kPa: suggestive of cACLD,  but needs further testing >13 kPa: highly suggestive of cACLD > or =17 kPa: highly suggestive of cACLD with an increased probability of clinically significant portal hypertension IMPRESSION: ULTRASOUND ABDOMEN: Cirrhotic appearing liver without focal mass. ULTRASOUND HEPATIC ELASTOGRAPHY: Median kPa:  95.8 Diagnostic category: > or =17 kPa: highly suggestive of cACLD with an increased probability of clinically significant portal hypertension Electronically Signed   By: Lavonia Dana M.D.   On: 05/03/2020 17:09   ECHOCARDIOGRAM COMPLETE  Result Date: 05/15/2020    ECHOCARDIOGRAM REPORT   Patient Name:   [name] Date of Exam: 05/15/2020 Medical Rec #:  [id number]       Height:       60.0 in Accession #:    [id number]      Weight:       127.9 lb Date of Birth:  January 11, 1966        BSA:          1.543 m Patient Age:    41 years        BP:           88/64 mmHg Patient Gender: F               HR:           95 bpm. Exam Location:  [facility] Procedure: 2D Echo, Cardiac Doppler and Color Doppler Indications:    Bacteremia R78.81  History:        Patient has prior history of Echocardiogram examinations, most                 recent 02/23/2020. Risk Factors:Dyslipidemia. Cardiac Arrest,                 Elevated Liver Function Test.  Sonographer:    Alvino Chapel RCS Referring Phys: [id number] [place]  1. Left ventricular ejection fraction, by estimation, is 60 to 65%. The left ventricle has normal function. The left ventricle has no regional wall motion abnormalities. Left ventricular diastolic parameters are indeterminate.  2. Right ventricular systolic function is normal. The right ventricular size is normal.  3. Left atrial size was moderately dilated.  4. The mitral valve is normal in structure. No evidence of mitral valve regurgitation. No evidence of mitral stenosis.  5. The aortic valve has an indeterminant number of cusps. Aortic valve regurgitation is not visualized. No aortic stenosis is  present.  6. The inferior vena cava is normal in size with greater than 50% respiratory variability, suggesting right atrial pressure of 3  mmHg. FINDINGS  Left Ventricle: Left ventricular ejection fraction, by estimation, is 60 to 65%. The left ventricle has normal function. The left ventricle has no regional wall motion abnormalities. The left ventricular internal cavity size was normal in size. There is  no left ventricular hypertrophy. Left ventricular diastolic parameters are indeterminate. Right Ventricle: The right ventricular size is normal. No increase in right ventricular wall thickness. Right ventricular systolic function is normal. Left Atrium: Left atrial size was moderately dilated. Right Atrium: Right atrial size was not well visualized. Pericardium: There is no evidence of pericardial effusion. Mitral Valve: The mitral valve is normal in structure. No evidence of mitral valve regurgitation. No evidence of mitral valve stenosis. Tricuspid Valve: The tricuspid valve is normal in structure. Tricuspid valve regurgitation is not demonstrated. No evidence of tricuspid stenosis. Aortic Valve: The aortic valve has an indeterminant number of cusps. Aortic valve regurgitation is not visualized. No aortic stenosis is present. Aortic valve mean gradient measures 5.1 mmHg. Aortic valve peak gradient measures 8.2 mmHg. Aortic valve area, by VTI measures 2.13 cm. Pulmonic Valve: The pulmonic valve was not well visualized. Pulmonic valve regurgitation is not visualized. No evidence of pulmonic stenosis. Aorta: The aortic root is normal in size and structure. Pulmonary Artery: Inadequate TR jet, indeterminant PASP. Venous: The inferior vena cava is normal in size with greater than 50% respiratory variability, suggesting right atrial pressure of 3 mmHg. IAS/Shunts: No atrial level shunt detected by color flow Doppler.  LEFT VENTRICLE PLAX 2D LVIDd:         4.80 cm  Diastology LVIDs:         3.10 cm  LV e' medial:     10.60 cm/s LV PW:         1.00 cm  LV E/e' medial:  11.3 LV IVS:        1.00 cm  LV e' lateral:   9.14 cm/s LVOT diam:     1.80 cm  LV E/e' lateral: 13.1 LV SV:         64 LV SV Index:   41 LVOT Area:     2.54 cm  RIGHT VENTRICLE RV S prime:     16.30 cm/s TAPSE (M-mode): 2.6 cm LEFT ATRIUM             Index       RIGHT ATRIUM           Index LA diam:        4.20 cm 2.72 cm/m  RA Area:     14.20 cm LA Vol (A2C):   53.8 ml 34.86 ml/m RA Volume:   36.10 ml  23.39 ml/m LA Vol (A4C):   64.2 ml 41.60 ml/m LA Biplane Vol: 62.5 ml 40.49 ml/m  AORTIC VALVE AV Area (Vmax):    2.40 cm AV Area (Vmean):   2.00 cm AV Area (VTI):     2.13 cm AV Vmax:           143.34 cm/s AV Vmean:          109.425 cm/s AV VTI:            0.301 m AV Peak Grad:      8.2 mmHg AV Mean Grad:      5.1 mmHg LVOT Vmax:         135.00 cm/s LVOT Vmean:        86.200 cm/s LVOT VTI:          0.251 m LVOT/AV VTI  ratio: 0.84  AORTA Ao Root diam: 3.00 cm MITRAL VALVE MV Area (PHT): 3.97 cm     SHUNTS MV Decel Time: 191 msec     Systemic VTI:  0.25 m MV E velocity: 120.00 cm/s  Systemic Diam: 1.80 cm MV A velocity: 87.30 cm/s MV E/A ratio:  1.37 Carlyle Dolly MD Electronically signed by Carlyle Dolly MD Signature Date/Time: 05/15/2020/2:16:14 PM    Final    ECHO TEE  Result Date: 05/16/2020    TRANSESOPHOGEAL ECHO REPORT   Patient Name:   [name] Date of Exam: 05/16/2020 Medical Rec #:  [id number]       Height:       60.0 in Accession #:    [id number]      Weight:       127.9 lb Date of Birth:  1965/03/21        BSA:          1.543 m Patient Age:    36 years        BP:           123/85 mmHg Patient Gender: F               HR:           101 bpm. Exam Location:  [facility] Procedure: Transesophageal Echo, Cardiac Doppler and Color Doppler Indications:    Bacteremia  History:        Patient has prior history of Echocardiogram examinations, most                 recent 05/15/2020. Risk Factors:Dyslipidemia. Cardiac Arrest,                 Elevated  Liver Function Test.  Sonographer:    Alvino Chapel RCS Referring Phys: [id number] [place]: The transesophogeal probe was passed without difficulty through the esophogus of the patient. Sedation performed by different physician. The patient developed no complications during the procedure. IMPRESSIONS  1. Left ventricular ejection fraction, by estimation, is 60 to 65%. The left ventricle has normal function.  2. Right ventricular systolic function is normal. The right ventricular size is normal.  3. Left atrial size was mildly dilated. No left atrial/left atrial appendage thrombus was detected. The LAA emptying velocity was 85 cm/s.  4. Right atrial size was mildly dilated.  5. The mitral valve is normal in structure. Trivial mitral valve regurgitation. No evidence of mitral stenosis.  6. The aortic valve is tricuspid. Aortic valve regurgitation is not visualized. No aortic stenosis is present. Conclusion(s)/Recommendation(s): No evidence of vegetation/infective endocarditis on this transesophageal echocardiogram. FINDINGS  Left Ventricle: Left ventricular ejection fraction, by estimation, is 60 to 65%. The left ventricle has normal function. The left ventricular internal cavity size was normal in size. Right Ventricle: The right ventricular size is normal. No increase in right ventricular wall thickness. Right ventricular systolic function is normal. Left Atrium: Left atrial size was mildly dilated. No left atrial/left atrial appendage thrombus was detected. The LAA emptying velocity was 85 cm/s. Right Atrium: Right atrial size was mildly dilated. Pericardium: There is no evidence of pericardial effusion. Mitral Valve: The mitral valve is normal in structure. Trivial mitral valve regurgitation. No evidence of mitral valve stenosis. Tricuspid Valve: The tricuspid valve is normal in structure. Tricuspid valve regurgitation is trivial. No evidence of tricuspid stenosis. Aortic Valve: The aortic valve  is tricuspid. Aortic valve regurgitation is not visualized. No aortic stenosis is present. Pulmonic Valve: The  pulmonic valve was normal in structure. Pulmonic valve regurgitation is not visualized. No evidence of pulmonic stenosis. Aorta: The aortic root is normal in size and structure. IAS/Shunts: No atrial level shunt detected by color flow Doppler. Carlyle Dolly MD Electronically signed by Carlyle Dolly MD Signature Date/Time: 05/16/2020/1:28:24 PM    Final    [name] ASCITES (ABDOMEN LIMITED)  Result Date: 05/14/2020 CLINICAL DATA:  Ascites, question sufficient for paracentesis, clinical concern for spontaneous bacterial peritonitis EXAM: LIMITED ABDOMEN ULTRASOUND FOR ASCITES TECHNIQUE: Limited ultrasound survey for ascites was performed in all four abdominal quadrants. COMPARISON:  CT abdomen and pelvis 05/13/2020 FINDINGS: Small volume ascites identified in the lower quadrants bilaterally. Volume of ascites has probably slightly increased since prior CT. While the volume of fluid is insufficient for therapeutic paracentesis, a small volume of ascites may be obtainable for diagnostic purposes. Diagnostic paracentesis will be attempted. IMPRESSION: Small volume ascites, likely sufficient volume for diagnostic paracentesis. Electronically Signed   By: Lavonia Dana M.D.   On: 05/14/2020 11:11   [name] EKG SITE RITE  Result Date: 05/17/2020 If Site Rite image not attached, placement could not be confirmed due to current cardiac rhythm.   Orson Eva, DO  [facility]  If 7PM-7AM, please contact night-coverage [email or web address] Password [email or web address] 05/18/2020, 5:25 PM   LOS: 5 days

## 2020-05-18 NOTE — TOC Progression Note (Signed)
Transition of Care [facility]) - Progression Note    Patient Details  Name: [name] MRN: [id number] Date of Birth: [birth year]  [facility]) CM/SW Contact  Natasha Bence, LCSW Phone Number: 05/18/2020, 5:11 PM  Clinical Narrative:    CSW notified of patient's recommendation for SNF. CSW contacted patient's mother who reported that patient is agreeable to [facility] referral and [place] SNF's referral. CSW placed referral for agreed SNF's TOC to follow.   Expected Discharge Plan: [place] Barriers to Discharge: Continued Medical Work up  Expected Discharge Plan and Services Expected Discharge Plan: [place]: RN,PT [facility] Agency: [place] Date [place]: 05/14/20 Time [place]: [id number] Representative spoke with at [place]: [place] Determinants of Health ([place]) Interventions    Readmission Risk Interventions Readmission Risk Prevention Plan 05/14/2020 02/24/2020 02/14/2020  Medication Screening - Complete Complete  Transportation Screening Complete Complete Complete  Home Care Screening Complete - -  Medication Review (RN CM) Complete - -  Some recent data might be hidden

## 2020-05-18 NOTE — Progress Notes (Signed)
Peripherally Inserted Central Catheter Placement  The IV Nurse has discussed with the patient and/or persons authorized to consent for the patient, the purpose of this procedure and the potential benefits and risks involved with this procedure.  The benefits include less needle sticks, lab draws from the catheter, and the patient may be discharged home with the catheter. Risks include, but not limited to, infection, bleeding, blood clot (thrombus formation), and puncture of an artery; nerve damage and irregular heartbeat and possibility to perform a PICC exchange if needed/ordered by physician.  Alternatives to this procedure were also discussed.  Bard Power PICC patient education guide, fact sheet on infection prevention and patient information card has been provided to patient /or left at bedside.    PICC Placement Documentation  PICC Single Lumen 69/45/03 Right Basilic 36 cm (Active)  Indication for Insertion or Continuance of Line Prolonged intravenous therapies 05/18/20 1600  Exposed Catheter (cm) 0 cm 05/18/20 1600  Site Assessment Clean;Intact;Dry 05/18/20 1600  Line Status Flushed;Blood return noted;Saline locked 05/18/20 1600  Dressing Type Transparent 05/18/20 1600  Dressing Status Clean;Intact;Dry 05/18/20 1600  Antimicrobial disc in place? Yes 05/18/20 1600  Dressing Change Due 05/25/20 05/18/20 1600       Valentina Shaggy Ramos 05/18/2020, 4:04 PM

## 2020-05-18 NOTE — NC FL2 (Signed)
[place] LEVEL OF CARE SCREENING TOOL     IDENTIFICATION  Patient Name: [name] Birthdate: 1965-12-04 Sex: female Admission Date (Current Location): 05/12/2020  [facility] and [place] Number:  [employer] and Address:  [facility] [address], [place]      Provider Number: [id number]  Attending Physician Name and Address:  Orson Eva, MD  Relative Name and Phone Number:  [name] (Niece)   [phone]    Current Level of Care: Hospital Recommended Level of Care: [place] Prior Approval Number:    Date Approved/Denied:   PASRR Number: Pending  Discharge Plan: SNF    Current Diagnoses: Patient Active Problem List   Diagnosis Date Noted  . Acute respiratory failure with hypoxia ([place]) 05/16/2020  . Severe sepsis with acute organ dysfunction due to methicillin susceptible Staphylococcus aureus (MSSA) ([place]) 05/16/2020  . Ascites   . Fever in adult 05/13/2020  . SIRS (systemic inflammatory response syndrome) ([place]) 05/13/2020  . Rib injury 05/13/2020  . Other cirrhosis of liver ([place]) 05/13/2020  . 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 Fever 05/13/2020  . Memory loss   . Transaminasemia   . Visual hallucination   . Generalized weakness 02/21/2020  . Fall at home, initial encounter 02/21/2020  . Hepatic encephalopathy ([place]) 02/21/2020  . Acute metabolic encephalopathy 75/44/9201  . Hyperammonemia ([place]) 02/13/2020  . Meningioma ([place]) 09/12/2019  . Benign neoplasm of cerebral meninges ([place]) 09/09/2019  . Anxiety 12/30/2018  . MDD (major depressive disorder), recurrent episode, mild ([place]) 01/08/2018  . Widowed - July 2019 08/07/2017  . Sleep apnea 06/09/2017  . Colon polyp 06/05/2017  . Elevated liver function tests 06/05/2017  . Fatty liver   . Mild neurocognitive disorder 02/04/2017  . Seizure ([place]) 11/14/2016  . CAP (community acquired pneumonia) 11/09/2016  . 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  Lobar pneumonia ([place]) 11/28/2015  . Partial symptomatic epilepsy with complex partial seizures, not intractable, with status epilepticus ([place]) 07/24/2015  . OA (osteoarthritis) of knee 05/07/2015  . 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 Seizure disorder ([place]) 07/17/2011    Orientation RESPIRATION BLADDER Height & Weight     Self,Time,Situation,Place  Normal Continent Weight: 142 lb 3.2 oz (64.5 kg) Height:  5' (152.4 cm)  BEHAVIORAL SYMPTOMS/MOOD NEUROLOGICAL BOWEL NUTRITION STATUS      Continent Diet (Diet regular Room service appropriate? Yes; Fluid consistency: Thin)  AMBULATORY STATUS COMMUNICATION OF NEEDS Skin   Extensive Assist Verbally Normal                       Personal Care Assistance Level of Assistance  Bathing,Feeding,Dressing Bathing Assistance: Limited assistance Feeding assistance: Independent Dressing Assistance: Limited assistance     Functional Limitations Info  Sight,Hearing,Speech Sight Info: Adequate Hearing Info: Adequate Speech Info: Adequate    SPECIAL CARE FACTORS FREQUENCY  PT (By licensed PT),OT (By licensed OT)     PT Frequency: 5x OT Frequency: 5x            Contractures Contractures Info: Not present    Additional Factors Info  Code Status,Allergies Code Status Info: Full Allergies Info: Acetaminophen  Benadryl (diphenhydramine Hcl)  Codeine  Dilantin (phenytoin Sodium Extended)  Melatonin  Tramadol  Ultram (tramadol Hcl)  Vimpat (lacosamide)  Mirtazapine  Betadine (povidone Iodine)  Latex  Penicillins  Sulfa Antibiotics  Tape  Vicodin           Current Medications (05/18/2020):  This is the current  hospital active medication list Current Facility-Administered Medications  Medication Dose Route Frequency Provider Last Rate Last Admin  . acetaminophen (TYLENOL) tablet 650 mg  650 mg Oral Q6H PRN Zierle-Ghosh, Asia B, DO   650 mg at 05/18/20 1037   Or  . acetaminophen (TYLENOL) suppository 650 mg  650 mg Rectal Q6H PRN  Zierle-Ghosh, Asia B, DO      . ceFAZolin (ANCEF) IVPB 2g/100 mL premix  2 g Intravenous Q8H Shahmehdi, Seyed A, MD 200 mL/hr at 05/18/20 0953 2 g at 05/18/20 0953  . Chlorhexidine Gluconate Cloth 2 % PADS 6 each  6 each Topical Daily Tat, David, MD   6 each at 05/18/20 [phone]  . feeding supplement (ENSURE ENLIVE / ENSURE PLUS) liquid 237 mL  237 mL Oral BID BM Orson Eva, MD   237 mL at 05/18/20 0951  . folic acid (FOLVITE) tablet 1 mg  1 mg Oral Daily Zierle-Ghosh, Asia B, DO   1 mg at 05/18/20 0951  . furosemide (LASIX) injection 40 mg  40 mg Intravenous BID Tat, David, MD      . guaiFENesin (ROBITUSSIN) 100 MG/5ML solution 100 mg  5 mL Oral Q4H PRN Shahmehdi, Seyed A, MD   100 mg at 05/16/20 0416  . heparin injection 5,000 Units  5,000 Units Subcutaneous Q8H Zierle-Ghosh, Asia B, DO   5,000 Units at 05/18/20 6712  . HYDROmorphone (DILAUDID) injection 1 mg  1 mg Intravenous Q3H PRN Kathie Dike, MD   1 mg at 05/18/20 1415  . lactated ringers infusion   Intravenous Continuous Skipper Cliche A, MD 125 mL/hr at 05/16/20 1003 Restarted at 05/16/20 1020  . lactobacillus (FLORANEX/LACTINEX) granules 1 g  1 g Oral TID WC Shahmehdi, Seyed A, MD   1 g at 05/18/20 1300  . lamoTRIgine (LAMICTAL) tablet 150 mg  150 mg Oral BID Zierle-Ghosh, Asia B, DO   150 mg at 05/18/20 0951  . levETIRAcetam (KEPPRA) tablet 1,000 mg  1,000 mg Oral BID Kathie Dike, MD   1,000 mg at 05/18/20 0951  . LORazepam (ATIVAN) tablet 1 mg  1 mg Oral Daily PRN Zierle-Ghosh, Asia B, DO   1 mg at 05/18/20 0147  . ondansetron (ZOFRAN) tablet 4 mg  4 mg Oral Q6H PRN Zierle-Ghosh, Asia B, DO       Or  . ondansetron (ZOFRAN) injection 4 mg  4 mg Intravenous Q6H PRN Zierle-Ghosh, Asia B, DO   4 mg at 05/16/20 0009  . potassium chloride SA (KLOR-CON) CR tablet 40 mEq  40 mEq Oral BID Orson Eva, MD   40 mEq at 05/18/20 1037     Discharge Medications: Please see discharge summary for a list of discharge medications.  Relevant  Imaging Results:  Relevant Lab Results:   Additional Information Pt SSN: [id number]  Natasha Bence, LCSW

## 2020-05-18 NOTE — Evaluation (Signed)
Occupational Therapy Evaluation Patient Details Name: [name] MRN: [id number] DOB: 03-03-1965 Today's Date: 05/18/2020    History of Present Illness [name]  is a 55 y.o. female, with history of seizures, mood disorder, benign brain tumor, fatty liver, hypercholesterol, and more presents the ED with a chief complaint of fall.  Patient reports that she did prior to presentation.  She reports that her house is being remodeled and she tripped on something and fell forward hit her head on the railing and landed on her right chest.  She reports that she hit the back of her head on the way down.  She had no loss of consciousness.  She has had no change in vision or hearing.  She reports that prior to the fall she did have dizziness, chest pain, nausea, but these are chronic daily symptoms for her.  She did not have palpitations.  Her main complaint at this time is that the right side of her chest hurts.  It is worse with deep breaths and with position changes.  She tried Tylenol at home with no relief.  She does admit to having fever prior to coming to the ED today.  She reports a T-max at home of 103.  At presentation her temperature was 101.  She denies cough.  She admits to urinary frequency but no dysuria or urgency.  She has been constipated but did take a laxative yesterday with adequate response.  She reports she had a UTI a month ago and had antibiotics for it and the symptoms cleared up.  She has no rashes or open sores on her body.  She reports nausea and vomiting x3 today prior to coming in on the ambulance.  There is nonbloody emesis.  She has no other complaints at this time.   Clinical Impression   Pt agreeable to OT/PT co-evaluation this date. Pt demonstrates B shoulder limitations in A/ROM indicating weakness in B UE. Pt required mod to max A for bed mobility with slow labored movement. Pt requires Mod A for sit to stand and stand pivot transfer. Pt donned 4L O2 during session. Pt  reports history of vision issues due to a brain tumor. Pt will benefit from continued OT in the hospital and recommended venue below to increase strength, balance, ROM, and endurance for safe ADL's.     Follow Up Recommendations  SNF    Equipment Recommendations  None recommended by OT           Precautions / Restrictions Precautions Precautions: Fall Restrictions Weight Bearing Restrictions: No      Mobility Bed Mobility Overal bed mobility: Needs Assistance Bed Mobility: Supine to Sit     Supine to sit: Mod assist;Max assist     General bed mobility comments: increased time, labored movement    Transfers Overall transfer level: Needs assistance   Transfers: Sit to/from Stand;Stand Pivot Transfers Sit to Stand: Mod assist Stand pivot transfers: Mod assist       General transfer comment: very unsteady on feet with buckling of knees due to weakness    Balance Overall balance assessment: Needs assistance Sitting-balance support: Feet supported;No upper extremity supported Sitting balance-Leahy Scale: Fair Sitting balance - Comments: seated at EOB   Standing balance support: During functional activity;Bilateral upper extremity supported Standing balance-Leahy Scale: Poor Standing balance comment: using RW                           ADL  either performed or assessed with clinical judgement   ADL Overall ADL's : Needs assistance/impaired Eating/Feeding: Minimal assistance;Sitting Eating/Feeding Details (indicate cue type and reason): assist needed to open containers; nursing notfied pt also requires assist for feeding. Min A per clinical judgement.                     Toilet Transfer: Moderate assistance;RW;Stand-pivot Toilet Transfer Details (indicate cue type and reason): simulated via EOB to chair transfer                 Vision Baseline Vision/History: Wears glasses Wears Glasses: At all times Patient Visual Report: Other  (comment) (Pt reports intermittent deficits in vission for the past year due to a brain tumor.)                  Pertinent Vitals/Pain Pain Assessment: 0-10 Pain Score: 8  Pain Location: B LE Pain Intervention(s): Monitored during session;Limited activity within patient's tolerance     Hand Dominance Right   Extremity/Trunk Assessment Upper Extremity Assessment Upper Extremity Assessment: Generalized weakness;RUE deficits/detail;LUE deficits/detail RUE Deficits / Details: ~145* shoulder flexion A/ROM; generally weak UE RUE Coordination: decreased fine motor;decreased gross motor LUE Deficits / Details: ~110* A/ROM shoulder flexion; general weakness.   Lower Extremity Assessment Lower Extremity Assessment: Defer to PT evaluation   Cervical / Trunk Assessment Cervical / Trunk Assessment: Normal   Communication Communication Communication: No difficulties   Cognition Arousal/Alertness: Awake/alert Behavior During Therapy: WFL for tasks assessed/performed Overall Cognitive Status: Within Functional Limits for tasks assessed                                                      Home Living Family/patient expects to be discharged to:: Private residence Living Arrangements: Other (Comment);Alone (Pt and pt's brother report she is able to live with her mother if SPV is needed. Prior to admission pt lived with mother for 3 weeks.) Available Help at Discharge: Family;Available PRN/intermittently;Other (Comment) (May be able to have 24/7 assist if pt lives with her mother as previously stated.) Type of Home: Mobile home Home Access: Stairs to enter Entrance Stairs-Number of Steps: 4 Entrance Stairs-Rails: Right;Left;Can reach both Home Layout: One level     Bathroom Shower/Tub: Occupational psychologist: Standard Bathroom Accessibility: Yes   Home Equipment: Shower seat;Bedside commode;Walker - 4 wheels   Additional Comments: Pt reports her mom  has been staying with her at night since last d/c to assist if needed      Prior Functioning/Environment Level of Independence: Needs assistance  Gait / Transfers Assistance Needed: pt reports using rollator walker in the home, pt endorses "a few" falls recently ADL's / Homemaking Assistance Needed: pt reports independent with bathing and dressing; mom completes cooking,cleaning and grocery shopping            OT Problem List: Decreased strength;Decreased range of motion;Decreased activity tolerance;Impaired balance (sitting and/or standing);Impaired vision/perception;Decreased coordination;Decreased safety awareness;Decreased knowledge of use of DME or AE;Impaired UE functional use      OT Treatment/Interventions: Self-care/ADL training;Therapeutic exercise;Energy conservation;Neuromuscular education;Therapeutic activities;Manual therapy;Visual/perceptual remediation/compensation;Patient/family education;Balance training    OT Goals(Current goals can be found in the care plan section) Acute Rehab OT Goals Patient Stated Goal: return home after rehab OT Goal Formulation: With patient Time For Goal Achievement: 06/01/20 Potential to  Achieve Goals: Fair  OT Frequency: Min 2X/week               Co-evaluation PT/OT/SLP Co-Evaluation/Treatment: Yes Reason for Co-Treatment: To address functional/ADL transfers;Complexity of the patient's impairments (multi-system involvement) PT goals addressed during session: Mobility/safety with mobility;Balance;Proper use of DME OT goals addressed during session: ADL's and self-care;Strengthening/ROM                       End of Session Equipment Utilized During Treatment: Rolling walker Nurse Communication: Other (comment) (Notified pt was upright in chair and required assist for eating.)  Activity Tolerance: Patient tolerated treatment well Patient left: in chair;with call bell/phone within reach  OT Visit Diagnosis: Unsteadiness on  feet (R26.81);History of falling (Z91.81);Other abnormalities of gait and mobility (R26.89);Muscle weakness (generalized) (M62.81);Adult, failure to thrive (R62.7)                Time: 0822-0842 OT Time Calculation (min): 20 min Charges:  OT General Charges $OT Visit: 1 Visit OT Evaluation $OT Eval Low Complexity: [place] OT, MOT   [name] 05/18/2020, 12:21 PM

## 2020-05-18 NOTE — Plan of Care (Signed)
  Problem: Acute Rehab OT Goals (only OT should resolve) Goal: Pt. Will Perform Grooming Flowsheets (Taken 05/18/2020 1225) Pt Will Perform Grooming:  standing  with min assist  with adaptive equipment Goal: Pt. Will Perform Upper Body Dressing Flowsheets (Taken 05/18/2020 1225) Pt Will Perform Upper Body Dressing:  with supervision  with adaptive equipment  sitting Goal: Pt. Will Perform Lower Body Dressing Flowsheets (Taken 05/18/2020 1225) Pt Will Perform Lower Body Dressing:  with mod assist  with min assist  with adaptive equipment  sitting/lateral leans Goal: Pt. Will Transfer To Toilet Flowsheets (Taken 05/18/2020 1225) Pt Will Transfer to Toilet:  with min guard assist  with min assist  stand pivot transfer  grab bars Goal: Pt/Caregiver Will Perform Home Exercise Program Flowsheets (Taken 05/18/2020 1225) Pt/caregiver will Perform Home Exercise Program:  Increased ROM  Both right and left upper extremity  Increased strength  Verenis [place]os[place] OT, MOT

## 2020-05-18 NOTE — Evaluation (Signed)
Physical Therapy Evaluation Patient Details Name: [name] MRN: [id number] DOB: June 23, 1965 Today's Date: 05/18/2020   History of Present Illness  [name]  is a 55 y.o. female, with history of seizures, mood disorder, benign brain tumor, fatty liver, hypercholesterol, and more presents the ED with a chief complaint of fall.  Patient reports that she did prior to presentation.  She reports that her house is being remodeled and she tripped on something and fell forward hit her head on the railing and landed on her right chest.  She reports that she hit the back of her head on the way down.  She had no loss of consciousness.  She has had no change in vision or hearing.  She reports that prior to the fall she did have dizziness, chest pain, nausea, but these are chronic daily symptoms for her.  She did not have palpitations.  Her main complaint at this time is that the right side of her chest hurts.  It is worse with deep breaths and with position changes.  She tried Tylenol at home with no relief.  She does admit to having fever prior to coming to the ED today.  She reports a T-max at home of 103.  At presentation her temperature was 101.  She denies cough.  She admits to urinary frequency but no dysuria or urgency.  She has been constipated but did take a laxative yesterday with adequate response.  She reports she had a UTI a month ago and had antibiotics for it and the symptoms cleared up.  She has no rashes or open sores on her body.  She reports nausea and vomiting x3 today prior to coming in on the ambulance.  There is nonbloody emesis.  She has no other complaints at this time.    Clinical Impression  Patient demonstrates slow labored movement for sitting up at bedside requiring Mod/max assist, very unsteady on feet, at high risk for falls and limited to a few side steps before having to sit due to fatigue and generalized weakness.  Patient will benefit from continued physical therapy in  hospital and recommended venue below to increase strength, balance, endurance for safe ADLs and gait.     Follow Up Recommendations SNF    Equipment Recommendations  None recommended by PT    Recommendations for Other Services       Precautions / Restrictions Precautions Precautions: Fall Restrictions Weight Bearing Restrictions: No      Mobility  Bed Mobility Overal bed mobility: Needs Assistance Bed Mobility: Supine to Sit     Supine to sit: Mod assist;Max assist     General bed mobility comments: increased time, labored movement    Transfers Overall transfer level: Needs assistance   Transfers: Sit to/from Stand;Stand Pivot Transfers Sit to Stand: Mod assist Stand pivot transfers: Mod assist       General transfer comment: very unsteady on feet with buckling of knees due to weakness  Ambulation/Gait Ambulation/Gait assistance: Max assist;Mod assist Gait Distance (Feet): 4 Feet Assistive device: Rolling walker (2 wheeled) Gait Pattern/deviations: Decreased step length - right;Decreased step length - left;Decreased stride length Gait velocity: decreased   General Gait Details: limited to 4-5 slow labored unsteady side steps due to fall risk, generalized weakness  Stairs            Wheelchair Mobility    Modified Rankin (Stroke Patients Only)       Balance Overall balance assessment: Needs assistance Sitting-balance support: Feet supported;No  upper extremity supported Sitting balance-Leahy Scale: Fair Sitting balance - Comments: seated at EOB   Standing balance support: During functional activity;Bilateral upper extremity supported Standing balance-Leahy Scale: Poor Standing balance comment: using RW                             Pertinent Vitals/Pain Pain Assessment: No/denies pain    Home Living Family/patient expects to be discharged to:: Private residence Living Arrangements: Alone Available Help at Discharge:  Family;Available PRN/intermittently Type of Home: Mobile home Home Access: Stairs to enter Entrance Stairs-Rails: Right;Left;Can reach both Entrance Stairs-Number of Steps: 4 Home Layout: One level Home Equipment: Shower seat;Bedside commode;Walker - 4 wheels Additional Comments: Pt reports her mom has been staying with her at night since last d/c to assist if needed    Prior Function Level of Independence: Needs assistance   Gait / Transfers Assistance Needed: pt reports using rollator walker in the home, pt endorses "a few" falls recently  ADL's / Homemaking Assistance Needed: pt reports independent with bathing and dressing; mom completes cooking,cleaning and grocery shopping        Hand Dominance   Dominant Hand: Right    Extremity/Trunk Assessment   Upper Extremity Assessment Upper Extremity Assessment: Defer to OT evaluation    Lower Extremity Assessment Lower Extremity Assessment: Generalized weakness    Cervical / Trunk Assessment Cervical / Trunk Assessment: Normal  Communication   Communication: No difficulties  Cognition Arousal/Alertness: Awake/alert Behavior During Therapy: WFL for tasks assessed/performed Overall Cognitive Status: Within Functional Limits for tasks assessed                                        General Comments      Exercises     Assessment/Plan    PT Assessment Patient needs continued PT services  PT Problem List Decreased strength;Decreased activity tolerance;Decreased balance;Decreased mobility       PT Treatment Interventions DME instruction;Gait training;Stair training;Functional mobility training;Therapeutic activities;Therapeutic exercise;Patient/family education;Balance training    PT Goals (Current goals can be found in the Care Plan section)  Acute Rehab PT Goals Patient Stated Goal: return home after rehab PT Goal Formulation: With patient Time For Goal Achievement: 06/01/20 Potential to Achieve  Goals: Good    Frequency Min 3X/week   Barriers to discharge        Co-evaluation PT/OT/SLP Co-Evaluation/Treatment: Yes Reason for Co-Treatment: Complexity of the patient's impairments (multi-system involvement);To address functional/ADL transfers;For patient/therapist safety PT goals addressed during session: Mobility/safety with mobility;Balance;Proper use of DME         AM-PAC PT "6 Clicks" Mobility  Outcome Measure Help needed turning from your back to your side while in a flat bed without using bedrails?: A Lot Help needed moving from lying on your back to sitting on the side of a flat bed without using bedrails?: A Lot Help needed moving to and from a bed to a chair (including a wheelchair)?: A Lot Help needed standing up from a chair using your arms (e.g., wheelchair or bedside chair)?: A Lot Help needed to walk in hospital room?: A Lot Help needed climbing 3-5 steps with a railing? : Total 6 Click Score: 11    End of Session   Activity Tolerance: Patient tolerated treatment well;Patient limited by fatigue Patient left: in chair;with call bell/phone within reach Nurse Communication: Mobility status PT Visit  Diagnosis: Unsteadiness on feet (R26.81);Other abnormalities of gait and mobility (R26.89);Muscle weakness (generalized) (M62.81)    Time: 0240-9735 PT Time Calculation (min) (ACUTE ONLY): 30 min   Charges:   PT Evaluation $PT Eval Moderate Complexity: 1 Mod PT Treatments $Therapeutic Activity: 23-37 mins        10:45 AM, 05/18/20 Lonell Grandchild, MPT Physical Therapist with [facility] 336 [phone] office [phone] mobile phone

## 2020-05-19 DIAGNOSIS — J9601 Acute respiratory failure with hypoxia: Secondary | ICD-10-CM | POA: Diagnosis not present

## 2020-05-19 DIAGNOSIS — A4101 Sepsis due to Methicillin susceptible Staphylococcus aureus: Secondary | ICD-10-CM | POA: Diagnosis not present

## 2020-05-19 DIAGNOSIS — G40909 Epilepsy, unspecified, not intractable, without status epilepticus: Secondary | ICD-10-CM | POA: Diagnosis not present

## 2020-05-19 DIAGNOSIS — R6510 Systemic inflammatory response syndrome (SIRS) of non-infectious origin without acute organ dysfunction: Secondary | ICD-10-CM | POA: Diagnosis not present

## 2020-05-19 LAB — BASIC METABOLIC PANEL
Anion gap: 7 (ref 5–15)
BUN: 11 mg/dL (ref 6–20)
CO2: 27 mmol/L (ref 22–32)
Calcium: 7.4 mg/dL — ABNORMAL LOW (ref 8.9–10.3)
Chloride: 101 mmol/L (ref 98–111)
Creatinine, Ser: 0.47 mg/dL (ref 0.44–1.00)
GFR, Estimated: >60 mL/min (ref >60)
Glucose, Bld: 105 mg/dL — ABNORMAL HIGH (ref 70–99)
Potassium: 3.3 mmol/L — ABNORMAL LOW (ref 3.5–5.1)
Sodium: 135 mmol/L (ref 135–145)

## 2020-05-19 LAB — CULTURE, BLOOD (SINGLE): Culture: NO GROWTH

## 2020-05-19 LAB — MAGNESIUM: Magnesium: 1.7 mg/dL (ref 1.7–2.4)

## 2020-05-19 MED ORDER — MAGNESIUM SULFATE 2 GM/50ML IV SOLN
2.0000 g | Freq: Once | INTRAVENOUS | Status: AC
  Administered 2020-05-19: 2 g via INTRAVENOUS
  Filled 2020-05-19: qty 50

## 2020-05-19 MED ORDER — OXYCODONE HCL 5 MG PO TABS: 5.0000 mg | ORAL_TABLET | ORAL | Status: DC | PRN

## 2020-05-19 MED ORDER — OXYCODONE HCL 5 MG PO TABS
5.0000 mg | ORAL_TABLET | Freq: Four times a day (QID) | ORAL | Status: DC | PRN
  Administered 2020-05-19 – 2020-05-21 (×5): 5 mg via ORAL
  Filled 2020-05-19 (×6): qty 1

## 2020-05-19 NOTE — TOC Progression Note (Addendum)
Transition of Care [facility]) - Progression Note    Patient Details  Name: [name] MRN: [id number] Date of Birth: [birth year]  [facility] CM/SW Contact  Natasha Bence, LCSW Phone Number: 05/19/2020, 5:12 PM  Clinical Narrative:    CSW contacted [place] to inquire about referral. Front desk reported that Admissions is not available on wknd to review referral. CSW faxed patient out to additional facilities within the region. CSW uploaded requested documents for PASSR to [place]. TOC to follow.   Expected Discharge Plan: [place] Barriers to Discharge: Continued Medical Work up  Expected Discharge Plan and Services Expected Discharge Plan: [place]: RN,PT [facility] Agency: [place] Date [place]: 05/14/20 Time [place]: [id number] Representative spoke with at [place]: [place] Determinants of Health ([place]) Interventions    Readmission Risk Interventions Readmission Risk Prevention Plan 05/14/2020 02/24/2020 02/14/2020  Medication Screening - Complete Complete  Transportation Screening Complete Complete Complete  Home Care Screening Complete - -  Medication Review (RN CM) Complete - -  Some recent data might be hidden

## 2020-05-19 NOTE — Progress Notes (Addendum)
PROGRESS NOTE  [name] PYP:[id number] DOB: 10/10/1965 DOA: 05/12/2020 PCP: [name], MD  Brief History: 55 year old female with a history of COPD, meningioma, cardiac arrest December 2020, hepatic steatosis,PNES, cognitive impairment, depression, complex partial seizure presentingwithgeneralized weakness and a fall.This has been a recurrent problem for the patient. She was admitted on 2/7 to 02/24/20 with a similar presentation. At that time she was treated for acute metabolic encephalopathy which was thought to be multifactorial including elevated ammonia, dehydration and dopamine agonist. She was discharged home with HHPT. She follow up with [facility], Dr. 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 She underwent liver elastography on 05/03/20 which suggested cACLD with increased probability of clinically significant portal HTN. She presented on 05/12/20 with another mechanical fall.She reports that her house is being remodeled and she tripped on something and fell forward hit her head on the railing and landed on her right chest. She reports that she hit the back of her head on the way down. She had no loss of consciousness. At presentation her temperature was 101.Blood cultures were done and she was found to have MSSA bacteremia. Also CT of the abd/pelvis on 05/13/20 showed signs of cirrhosis with portal HTN. She also had splenomegaly. THere was also a mildly thickened cecum and ascending colon with diffuse edematous appearance of the sm bowel. She underwent TEE on 05/16/20 after which she remained hypoxic on NRB. She was then transferred to SDU for further care.  Assessment/Plan: Severe Sepsis -present on admission -presented with fever, tachycardia, tachypnea, elevated lactate -due to MSSA bacteremia -Lactic peaked 2.2>>1.8 -UA 6-10 WBC -CXR--no consolidation -Random cortisol level 17.1 -sepsis physiology  resolved  MSSA Bacteremia -05/12/20 blood culture--positive MSSA -05/14/20 blood cuture--neg to date -continue cefazolin -05/16/20 TEE--no vegetation -PICC line 5/6 if surveillance blood cultures remain neg -plan abx till 5/29  Acute respiratory failure with hypoxia -oxygen saturation 88-90% on NRB in PACU 5/4 -personally reviewed CXR--bilateral interstitial infiltrates -ABG 7.471/32/57/24 (0.8) -schedule lasix 40 mg IV--increase to bid -05/15/20 Echo=EF 60-65%, no WMA, indeterminant diastolic -now down to 2.4P [facility]  NAFLD/Liver Cirrhosis -05/13/20 CT abd as discussed above -05/14/20 paracentesis--WBC 51, only 20 cc removed -remains hypervolemic  Colonic Wall Thickening -?portal HTN colopathy -no abd pain, no diarrhea  Gait instability/frequent falls/dizziness -PT evaluation-->SNF -CT brain negativeacute findings -No focal deficits on exam -05/15/20 echo--EF 60-65%, no WMA  Complex partial seizure/PNES -Continue home dose of Keppra and Lamictal  Depression -Continue fluoxetine 80 mg daily -Continue as needed lorazepam  Meningioma -Patient follows Dr. [name] -s/p fractionated radiosurgery Sept 2020--Dr. [name] -01/18/20 MR brain--mass is stable in size as compared to the brain MRI of 09/13/2019 -no focal deficits on exam  Hypokalemia -replete  Chronic pain -d/c dilaudid due to drowsiness -start oxycodone      Status is: Inpatient  Remains inpatient appropriate because:Hemodynamically unstable and IV treatments appropriate due to intensity of illness or inability to take PO   Dispo: The patient is from:Home Anticipated d/c is YK:DXIP Patient currently is not medically stable to d/c. Difficult to place patient No        Family Communication:niece updated 5/7  Consultants:Cardiology/GI  Code Status: FULL   DVT Prophylaxis: [place] Heparin    Procedures: As Listed in Progress Note  Above  Antibiotics: Cefazolin 5/2>>       Subjective: Patient denies fevers, chills, headache, chest pain, dyspnea, nausea, vomiting, diarrhea, abdominal  pain, dysuria, hematuria,   Objective: Vitals:   05/19/20 1000 05/19/20 1129 05/19/20 1200 05/19/20 1429  BP: 124/89  113/75   Pulse: (!) 102 (!) 107 (!) 101   Resp: 17 18 (!) 21   Temp:  98.5 F (36.9 C)    TempSrc:  Oral    SpO2: 92% 94% 94% 94%  Weight:      Height:        Intake/Output Summary (Last 24 hours) at 05/19/2020 1532 Last data filed at 05/19/2020 1006 Gross per 24 hour  Intake 1140 ml  Output 2100 ml  Net -960 ml   Weight change: 0.7 kg Exam:   General:  Pt is alert, follows commands appropriately, not in acute distress  HEENT: No icterus, No thrush, No neck mass, [place]/AT  Cardiovascular: RRR, S1/S2, no rubs, no gallops  Respiratory: bibasilar crackles. No wheeze  Abdomen: Soft/+BS, non tender, non distended, no guarding  Extremities: 1+LE edema, No lymphangitis, No petechiae, No rashes, no synovitis   Data Reviewed: I have personally reviewed following labs and imaging studies Basic Metabolic Panel: Recent Labs  Lab 05/13/20 0551 05/14/20 0421 05/16/20 1620 05/17/20 0444 05/18/20 0504 05/19/20 0421  NA 138 134* 134* 136 135 135  K 3.2* 3.2* 3.3* 3.6 2.9* 3.3*  CL 109 108 104 104 103 101  CO2 21* 21* 23 25 25 27   GLUCOSE 113* 106* 111* 109* 97 105*  BUN 9 12 9 10 12 11   CREATININE 0.53 0.55 0.50 0.45 0.44 0.47  CALCIUM 7.9* 7.4* 7.5* 7.6* 7.5* 7.4*  MG 1.4*  --   --  1.9 1.9 1.7   Liver Function Tests: Recent Labs  Lab 05/12/20 2232 05/13/20 0551 05/14/20 0421 05/16/20 1620 05/17/20 0444  AST 138* 121* 127* 109* 104*  ALT 66* 60* 58* 43 41  ALKPHOS 165* 140* 126 131* 138*  BILITOT 1.9* 1.5* 1.4* 1.8* 1.6*  PROT 6.0* 5.3* 5.3* 5.3* 5.1*  ALBUMIN 2.9* 2.6* 2.5* 2.3* 2.3*   Recent Labs  Lab 05/12/20 2232  LIPASE 37   No results for input(s): AMMONIA in the last 168  hours. Coagulation Profile: Recent Labs  Lab 05/13/20 0551  INR 1.4*   CBC: Recent Labs  Lab 05/12/20 2232 05/13/20 0551 05/14/20 0421 05/16/20 1620 05/17/20 0444  WBC 12.4* 14.4* 10.0 11.2* 13.1*  NEUTROABS  --  12.5*  --   --   --   HGB 10.4* 9.7* 9.4* 10.0* 9.7*  HCT 30.3* 30.0* 28.2* 29.1* 28.9*  MCV 91.0 92.9 92.5 89.3 89.8  PLT 214 164 171 197 219   Cardiac Enzymes: No results for input(s): CKTOTAL, CKMB, CKMBINDEX, TROPONINI in the last 168 hours. BNP: Invalid input(s): POCBNP CBG: No results for input(s): GLUCAP in the last 168 hours. HbA1C: No results for input(s): HGBA1C in the last 72 hours. Urine analysis:    Component Value Date/Time   COLORURINE YELLOW 05/13/2020 0141   APPEARANCEUR CLEAR 05/13/2020 0141   APPEARANCEUR Clear 01/30/2017 1020   LABSPEC 1.043 (H) 05/13/2020 0141   PHURINE 6.0 05/13/2020 0141   GLUCOSEU NEGATIVE 05/13/2020 0141   HGBUR SMALL (A) 05/13/2020 0141   BILIRUBINUR NEGATIVE 05/13/2020 0141   BILIRUBINUR Negative 01/30/2017 1020   KETONESUR NEGATIVE 05/13/2020 0141   PROTEINUR NEGATIVE 05/13/2020 0141   UROBILINOGEN negative 04/19/2014 1332   UROBILINOGEN 0.2 12/04/2013 1414   NITRITE POSITIVE (A) 05/13/2020 0141   LEUKOCYTESUR NEGATIVE 05/13/2020 0141   Sepsis Labs: @LABRCNTIP (procalcitonin:4,lacticidven:4) ) Recent Results (from the past 240 hour(s))  Culture, blood (routine x 2)     Status: Abnormal   Collection Time: 05/12/20 10:45 PM   Specimen: BLOOD LEFT FOREARM  Result Value Ref Range Status   Specimen Description   Final    BLOOD LEFT FOREARM Performed at [facility], [address] [place], [place] [address]    Special Requests   Final    Blood Culture results may not be optimal due to an excessive volume of blood received in culture bottles BOTTLES DRAWN AEROBIC AND ANAEROBIC Performed at [facility], [address] [place], [place] [address]    Culture  Setup Time   Final    GRAM POSITIVE COCCI BOTH  ANAEROBIC AND AEROBIC BOTTLES Gram Stain Report Called to,Read Back By and Verified With: GANTT,E@1419  BY MATTHEWS, B 5.1.22 Organism ID to follow CRITICAL RESULT CALLED TO, READ BACK BY AND VERIFIED WITH: N,ONDARA RN @2130  05/13/20 AH  Performed at [facility], [place] [address] [place], [place] [address]    Culture STAPHYLOCOCCUS AUREUS (A)  Final   Report Status 05/15/2020 FINAL  Final   Organism ID, Bacteria STAPHYLOCOCCUS AUREUS  Final      Susceptibility   Staphylococcus aureus - MIC*    CIPROFLOXACIN >=8 RESISTANT Resistant     ERYTHROMYCIN >=8 RESISTANT Resistant     GENTAMICIN <=0.5 SENSITIVE Sensitive     OXACILLIN <=0.25 SENSITIVE Sensitive     TETRACYCLINE <=1 SENSITIVE Sensitive     VANCOMYCIN <=0.5 SENSITIVE Sensitive     TRIMETH/SULFA <=10 SENSITIVE Sensitive     CLINDAMYCIN <=0.25 SENSITIVE Sensitive     RIFAMPIN <=0.5 SENSITIVE Sensitive     Inducible Clindamycin NEGATIVE Sensitive     * STAPHYLOCOCCUS AUREUS  Blood Culture ID Panel (Reflexed)     Status: Abnormal   Collection Time: 05/12/20 10:45 PM  Result Value Ref Range Status   Enterococcus faecalis NOT DETECTED NOT DETECTED Final   Enterococcus Faecium NOT DETECTED NOT DETECTED Final   Listeria monocytogenes NOT DETECTED NOT DETECTED Final   Staphylococcus species DETECTED (A) NOT DETECTED Final    Comment: CRITICAL RESULT CALLED TO, READ BACK BY AND VERIFIED WITH: NACKSON ONDARA,RN AT 2132 05/13/2020 A.HUGHES    Staphylococcus aureus (BCID) DETECTED (A) NOT DETECTED Final    Comment: CRITICAL RESULT CALLED TO, READ BACK BY AND VERIFIED WITH: Valentino Hue, RN AT  2132 05/13/2020 A.HUGHES    Staphylococcus epidermidis NOT DETECTED NOT DETECTED Final   Staphylococcus lugdunensis NOT DETECTED NOT DETECTED Final   Streptococcus species NOT DETECTED NOT DETECTED Final   Streptococcus agalactiae NOT DETECTED NOT DETECTED Final   Streptococcus pneumoniae NOT DETECTED NOT DETECTED Final   Streptococcus  pyogenes NOT DETECTED NOT DETECTED Final   A.calcoaceticus-baumannii NOT DETECTED NOT DETECTED Final   Bacteroides fragilis NOT DETECTED NOT DETECTED Final   Enterobacterales NOT DETECTED NOT DETECTED Final   Enterobacter cloacae complex NOT DETECTED NOT DETECTED Final   Escherichia coli NOT DETECTED NOT DETECTED Final   Klebsiella aerogenes NOT DETECTED NOT DETECTED Final   Klebsiella oxytoca NOT DETECTED NOT DETECTED Final   Klebsiella pneumoniae NOT DETECTED NOT DETECTED Final   Proteus species NOT DETECTED NOT DETECTED Final   Salmonella species NOT DETECTED NOT DETECTED Final   Serratia marcescens NOT DETECTED NOT DETECTED Final   Haemophilus influenzae NOT DETECTED NOT DETECTED Final   Neisseria meningitidis NOT DETECTED NOT DETECTED Final   Pseudomonas aeruginosa NOT DETECTED NOT DETECTED Final   Stenotrophomonas maltophilia NOT DETECTED NOT DETECTED Final   Candida  albicans NOT DETECTED NOT DETECTED Final   Candida auris NOT DETECTED NOT DETECTED Final   Candida glabrata NOT DETECTED NOT DETECTED Final   Candida krusei NOT DETECTED NOT DETECTED Final   Candida parapsilosis NOT DETECTED NOT DETECTED Final   Candida tropicalis NOT DETECTED NOT DETECTED Final   Cryptococcus neoformans/gattii NOT DETECTED NOT DETECTED Final   Meth resistant mecA/C and MREJ NOT DETECTED NOT DETECTED Final    Comment: Performed at [facility], [address] [place], [place] [address]  Resp Panel by RT-PCR (Flu A&B, Covid) Nasopharyngeal Swab     Status: None   Collection Time: 05/12/20 10:46 PM   Specimen: Nasopharyngeal Swab; Nasopharyngeal(NP) swabs in vial transport medium  Result Value Ref Range Status   SARS Coronavirus 2 by RT PCR NEGATIVE NEGATIVE Final    Comment: (NOTE) SARS-CoV-2 target nucleic acids are NOT DETECTED.  The SARS-CoV-2 RNA is generally detectable in upper respiratory specimens during the acute phase of infection. The lowest concentration of SARS-CoV-2 viral  copies this assay can detect is 138 copies/mL. A negative result does not preclude SARS-Cov-2 infection and should not be used as the sole basis for treatment or other patient management decisions. A negative result may occur with  improper specimen collection/handling, submission of specimen other than nasopharyngeal swab, presence of viral mutation(s) within the areas targeted by this assay, and inadequate number of viral copies(<138 copies/mL). A negative result must be combined with clinical observations, patient history, and epidemiological information. The expected result is Negative.  Fact Sheet for Patients:  BloggerCourse.com  Fact Sheet for Healthcare Providers:  SeriousBroker.it  This test is no t yet approved or cleared by the [place] FDA and  has been authorized for detection and/or diagnosis of SARS-CoV-2 by FDA under an Emergency Use Authorization (EUA). This EUA will remain  in effect (meaning this test can be used) for the duration of the COVID-19 declaration under Section 564(b)(1) of the Act, 21 U.S.C.section 360bbb-3(b)(1), unless the authorization is terminated  or revoked sooner.       Influenza A by PCR NEGATIVE NEGATIVE Final   Influenza B by PCR NEGATIVE NEGATIVE Final    Comment: (NOTE) The Xpert Xpress SARS-CoV-2/FLU/RSV plus assay is intended as an aid in the diagnosis of influenza from Nasopharyngeal swab specimens and should not be used as a sole basis for treatment. Nasal washings and aspirates are unacceptable for Xpert Xpress SARS-CoV-2/FLU/RSV testing.  Fact Sheet for Patients: BloggerCourse.com  Fact Sheet for Healthcare Providers: SeriousBroker.it  This test is not yet approved or cleared by the [place] FDA and has been authorized for detection and/or diagnosis of SARS-CoV-2 by FDA under an Emergency Use Authorization (EUA). This  EUA will remain in effect (meaning this test can be used) for the duration of the COVID-19 declaration under Section 564(b)(1) of the Act, 21 U.S.C. section 360bbb-3(b)(1), unless the authorization is terminated or revoked.  Performed at [facility], [address] [place], [place] [address]   Culture, blood (routine x 2)     Status: Abnormal   Collection Time: 05/12/20 11:10 PM   Specimen: BLOOD RIGHT HAND  Result Value Ref Range Status   Specimen Description   Final    BLOOD RIGHT HAND Performed at [facility], [address] [place], [place] [address]    Special Requests   Final    Blood Culture adequate volume BOTTLES DRAWN AEROBIC AND ANAEROBIC Performed at [facility], [address] [place], [place] [address]  Culture  Setup Time   Final    GRAM POSITIVE COCCI ANAEROBIC AND AEROBIC BOTTLES Gram Stain Report Called to,Read Back By and Verified With: GANTT,E@1419  BY MATTHEWS B 5.1.22 CRITICAL VALUE NOTED.  VALUE IS CONSISTENT WITH PREVIOUSLY REPORTED AND CALLED VALUE.    Culture (A)  Final    STAPHYLOCOCCUS AUREUS SUSCEPTIBILITIES PERFORMED ON PREVIOUS CULTURE WITHIN THE LAST 5 DAYS. Performed at [facility], [place] [address] [place], [place] [address]    Report Status 05/15/2020 FINAL  Final  Culture, Urine     Status: Abnormal   Collection Time: 05/13/20  1:41 AM   Specimen: Urine, Clean Catch  Result Value Ref Range Status   Specimen Description   Final    URINE, CLEAN CATCH Performed at [facility], [address] [place], [place] [address]    Special Requests   Final    NONE Performed at [facility], [address], [place], [place] [address]    Culture >=100,000 COLONIES/mL STAPHYLOCOCCUS AUREUS (A)  Final   Report Status 05/16/2020 FINAL  Final   Organism ID, Bacteria STAPHYLOCOCCUS AUREUS (A)  Final      Susceptibility   Staphylococcus aureus - MIC*    CIPROFLOXACIN >=8 RESISTANT Resistant     GENTAMICIN <=0.5 SENSITIVE Sensitive      NITROFURANTOIN <=16 SENSITIVE Sensitive     OXACILLIN <=0.25 SENSITIVE Sensitive     TETRACYCLINE <=1 SENSITIVE Sensitive     VANCOMYCIN <=0.5 SENSITIVE Sensitive     TRIMETH/SULFA <=10 SENSITIVE Sensitive     CLINDAMYCIN <=0.25 SENSITIVE Sensitive     RIFAMPIN <=0.5 SENSITIVE Sensitive     Inducible Clindamycin NEGATIVE Sensitive     * >=100,000 COLONIES/mL STAPHYLOCOCCUS AUREUS  Culture, blood (single)     Status: None   Collection Time: 05/14/20 10:08 AM   Specimen: BLOOD  Result Value Ref Range Status   Specimen Description BLOOD BLOOD LEFT HAND  Final   Special Requests   Final    Blood Culture results may not be optimal due to an inadequate volume of blood received in culture bottles BOTTLES DRAWN AEROBIC AND ANAEROBIC   Culture   Final    NO GROWTH 5 DAYS Performed at [facility], [address], [place], [place] [address]    Report Status 05/19/2020 FINAL  Final  Body fluid culture w Gram Stain     Status: None   Collection Time: 05/14/20 12:16 PM   Specimen: Ascitic; Body Fluid  Result Value Ref Range Status   Specimen Description   Final    ASCITIC Performed at [facility], [address], [place], [place] [address]    Special Requests   Final    NONE Performed at [facility], [address] [place], [place] [address]    Gram Stain NO WBC SEEN NO ORGANISMS SEEN   Final   Culture   Final    NO GROWTH 3 DAYS Performed at [facility], [place] [address] [place], [place] [address]    Report Status 05/17/2020 FINAL  Final  MRSA PCR Screening     Status: None   Collection Time: 05/16/20  2:57 PM   Specimen: Nasal Mucosa; Nasopharyngeal  Result Value Ref Range Status   MRSA by PCR NEGATIVE NEGATIVE Final    Comment:        The GeneXpert MRSA Assay (FDA approved for NASAL specimens only), is one component of a comprehensive MRSA colonization surveillance program. It is not intended to diagnose MRSA  infection nor to guide or monitor treatment for MRSA  infections. Performed at [facility], [address] [place], [place] [address]      Scheduled Meds: . Chlorhexidine Gluconate Cloth  6 each Topical Daily  . feeding supplement  237 mL Oral BID BM  . folic acid  1 mg Oral Daily  . furosemide  40 mg Intravenous BID  . heparin  5,000 Units Subcutaneous Q8H  . lactobacillus  1 g Oral TID WC  . lamoTRIgine  150 mg Oral BID  . levETIRAcetam  1,000 mg Oral BID  . potassium chloride  40 mEq Oral BID  . sodium chloride flush  10-40 mL Intracatheter Q12H   Continuous Infusions: .  ceFAZolin (ANCEF) IV 2 g (05/19/20 0905)  . lactated ringers 125 mL/hr at 05/16/20 1003  . magnesium sulfate bolus IVPB      Procedures/Studies: CT HEAD WO CONTRAST  Result Date: 05/13/2020 CLINICAL DATA:  Fall 2 days prior EXAM: CT HEAD WITHOUT CONTRAST TECHNIQUE: Contiguous axial images were obtained from the base of the skull through the vertex without intravenous contrast. COMPARISON:  CT 02/20/2020 FINDINGS: Brain: No evidence of acute infarction, hemorrhage, hydrocephalus, extra-axial collection, visible mass lesion or mass effect. Vascular: Atherosclerotic calcification of the carotid siphons. No hyperdense vessel. Skull: No calvarial fracture or suspicious osseous lesion. No scalp swelling or hematoma. Sinuses/Orbits: Paranasal sinuses are predominantly clear. Mastoid air cells appear chronically hypo pneumatized. Middle ear cavities are clear. Debris in the bilateral external auditory canals. Included orbital structures are unremarkable. Other: Asymmetric thickening and calcification in the left parotid gland is a chronic, nonspecific finding, similar to comparison study. IMPRESSION: No acute intracranial abnormality. No calvarial fracture, significant scalp swelling or hematoma. Intracranial atherosclerosis. Asymmetric thickening and calcification in the left parotid gland, a nonspecific, chronic finding unchanged from prior. Can reflect sequela of a chronic  inflammatory process. Debris in the external auditory canals, correlate for cerumen impaction. Electronically Signed   By: Lovena Le M.D.   On: 05/13/2020 00:44   CT ABDOMEN PELVIS W CONTRAST  Result Date: 05/13/2020 CLINICAL DATA:  Abdominal pain, fever, right upper quadrant pain EXAM: CT ABDOMEN AND PELVIS WITH CONTRAST TECHNIQUE: Multidetector CT imaging of the abdomen and pelvis was performed using the standard protocol following bolus administration of intravenous contrast. CONTRAST:  163mL OMNIPAQUE IOHEXOL 300 MG/ML  SOLN COMPARISON:  CT 02/12/2020 FINDINGS: Lower chest: Basilar atelectatic changes in the lungs including more bandlike areas of subsegmental atelectasis or scarring. Normal heart size. No pericardial effusion. Hepatobiliary: Heterogeneous hepatic attenuation with a nodular liver surface contour concerning for intrinsic liver disease/cirrhosis. No concerning focal liver lesion. Moderate gallbladder distension. No significant gallbladder wall thickening or pericholecystic inflammation is seen. No visible calcified gallstones or biliary ductal dilatation. Pancreas: Slightly edematous appearance of the pancreatic parenchyma with very faint peripancreatic haze, uniform enhancement. No pancreatic ductal dilatation. Spleen: Mild splenomegaly.  No concerning focal splenic lesion. Adrenals/Urinary Tract: Normal adrenals. Kidneys enhance symmetrically. Symmetric and uniform excretion. No concerning focal renal lesion. Bilateral renal cortical scarring. Nonobstructing calculi present in both kidneys. No obstructive urolithiasis or hydronephrosis is seen at this time. Bladder is unremarkable for the degree of distention. Stomach/Bowel: Paraesophageal venous collaterals and varices. Some minimal collateralization and varices about the gastric fundus as well. Thickening towards the gastric antrum likely reflecting normal peristalsis. Air and fluid-filled appearance of the duodenum with a normal sweep  across the midline abdomen. Slightly edematous appearance diffusely through the small bowel is nonspecific given additional findings  above. Moderate right-sided colonic stool burden, particularly within the cecum. Some mild circumferential thickening and mucosal hyperemia about the cecum and ascending colon is nonspecific given hepatic features. No evidence of mechanical obstruction. Appendix is not well visualized. Vascular/Lymphatic: Atherosclerotic calcifications within the abdominal aorta and branch vessels. No aneurysm or ectasia. Paraesophageal and gastric varices with venous collaterals, as above. Splenorenal collateralization. Upper abdominal varices.No enlarged abdominopelvic lymph nodes. Few scattered calcified lymph nodes are present in the abdomen and pelvis. Reproductive: Portion the pelvis obscured by streak artifact. 2.6 cm cystic focus in the left adnexa, unchanged from comparison. No concerning right adnexal lesion. Prior hysterectomy. Other: Increasing edematous changes of the central mesentery. Small volume low-attenuation fluid in the deep pelvis as well as trace perihepatic ascites. Circumferential body wall edema. No bowel containing hernia. No free abdominopelvic air. Musculoskeletal: Multilevel degenerative changes are present in the imaged portions of the spine. Grade 1 anterolisthesis L4 on L5 without spondylolysis, unchanged from prior. Prior left hip arthroplasty with chronic bony remodeling and a similar appearance overall to comparison prior. IMPRESSION: Constellation of features suggestive of intrinsic liver disease/cirrhosis with features of portal hypertension including a nodular, heterogeneous liver with paraesophageal and gastric varices, additional upper abdominal venous collateralization, splenorenal shunting and splenomegaly. Interval development of a small volume ascites as well as additional features of at least mild or early developing anasarca with circumferential body wall  edema and central mesenteric edema. Some hazy peripancreatic stranding may be present, possibly related to a diffusely edematous appearance of the mesentery though could correlate with lipase. Mildly thickened appearance of the cecum and ascending colon as well as a diffusely edematous appearance of the small bowel is nonspecific in the setting of suspected intrinsic liver disease. Could reflect portal enteropathy/colopathy though should exclude clinical symptoms of enterocolitis. Bilateral nonobstructing nephrolithiasis. No obstructive urolithiasis or hydronephrosis at this time. Bilateral renal cortical scarring. Prior hysterectomy. Stable 2.7 cm left ovarian cyst. Continued stability favoring a benign process. No follow-up imaging recommended. Note: This recommendation does not apply to premenarchal patients and to those with increased risk (genetic, family history, elevated tumor markers or other high-risk factors) of ovarian cancer. Reference: JACR 2020 Feb; 17(2):248-254 Aortic Atherosclerosis (ICD10-I70.0). Electronically Signed   By: Lovena Le M.D.   On: 05/13/2020 00:35   US Abdomen Limited  Result Date: 05/13/2020 CLINICAL DATA:  Right upper quadrant pain. EXAM: ULTRASOUND ABDOMEN LIMITED RIGHT UPPER QUADRANT COMPARISON:  February 23, 2020 ultrasound.  CT scan May 12, 2020. FINDINGS: Gallbladder: A small amount of sludge is seen in the gallbladder. No stones, Murphy's sign, or wall thickening. A small amount of ascites is identified, including adjacent to the gallbladder. Common bile duct: Diameter: 5 mm Liver: There is a nodular contour in the liver. No focal mass. Portal vein is patent on color Doppler imaging with normal direction of blood flow towards the liver. Other: Mild ascites. IMPRESSION: 1. Nodular contour to the liver suggesting cirrhosis.  Mild ascites. 2. Mild sludge in otherwise normal appearing gallbladder. A small amount of fluid adjacent to the gallbladder is likely due to the  ascites. Electronically Signed   By: Dorise Bullion III M.D   On: 05/13/2020 09:06   US Paracentesis  Result Date: 05/14/2020 INDICATION: Fever, abdominal pain, question spontaneous bacterial peritonitis, ascites on CT EXAM: ULTRASOUND GUIDED DIAGNOSTIC PARACENTESIS MEDICATIONS: None COMPLICATIONS: None immediate PROCEDURE: Informed written consent was obtained from the patient after a discussion of the risks, benefits and alternatives to treatment. A timeout was performed prior  to the initiation of the procedure. Initial ultrasound scanning demonstrates a small amount of ascites within the right lower abdominal quadrant. The right lower abdomen was prepped and draped in the usual sterile fashion. 1% lidocaine was used for local anesthesia. Following this, a 5 [place] Yueh catheter was introduced. An ultrasound image was saved for documentation purposes. The paracentesis was performed. The catheter was removed and a dressing was applied. The patient tolerated the procedure well without immediate post procedural complication. FINDINGS: A total of approximately 20 mL of clear yellow ascitic fluid was removed. Samples were sent to the laboratory as requested by the clinical team. IMPRESSION: Successful ultrasound-guided paracentesis yielding 20 mL of peritoneal fluid for diagnostic testing. Electronically Signed   By: Lavonia Dana M.D.   On: 05/14/2020 12:39   DG CHEST PORT 1 VIEW  Result Date: 05/16/2020 CLINICAL DATA:  Shortness of breath. EXAM: PORTABLE CHEST 1 VIEW COMPARISON:  Chest x-ray dated May 12, 2020. FINDINGS: The heart size and mediastinal contours are within normal limits. Prominent diffuse interstitial thickening with bilateral perihilar opacities. Small bilateral pleural effusions. No pneumothorax. No acute osseous abnormality. IMPRESSION: 1. Moderate to severe pulmonary edema with small bilateral pleural effusions. Electronically Signed   By: Titus Dubin M.D.   On: 05/16/2020 12:57   DG  Chest Portable 1 View  Result Date: 05/12/2020 CLINICAL DATA:  Right sided rib pain. EXAM: PORTABLE CHEST 1 VIEW COMPARISON:  February 12, 2020 FINDINGS: A very mild amount of linear atelectasis is seen within the right lung base. There is no evidence of acute infiltrate, pleural effusion or pneumothorax. The heart size and mediastinal contours are within normal limits. A chronic seventh right rib fracture is seen. IMPRESSION: 1. Very mild right basilar linear atelectasis. 2. Chronic seventh right rib fracture. Electronically Signed   By: Virgina Norfolk M.D.   On: 05/12/2020 23:16   US ABDOMEN COMPLETE W/ELASTOGRAPHY  Result Date: 05/03/2020 CLINICAL DATA:  Fatty liver EXAM: ULTRASOUND ABDOMEN ULTRASOUND HEPATIC ELASTOGRAPHY TECHNIQUE: Sonography of the upper abdomen was performed. In addition, ultrasound elastography evaluation of the liver was performed. A region of interest was placed within the right lobe of the liver. Following application of a compressive sonographic pulse, tissue compressibility was assessed. Multiple assessments were performed at the selected site. Median tissue compressibility was determined. Previously, hepatic stiffness was assessed by shear wave velocity. Based on recently published Society of Radiologists in Ultrasound consensus article, reporting is now recommended to be performed in the SI units of pressure (kiloPascals) representing hepatic stiffness/elasticity. The obtained result is compared to the published reference standards. (cACLD = compensated Advanced Chronic Liver Disease) COMPARISON:  02/23/2020 FINDINGS: ULTRASOUND ABDOMEN Gallbladder: Gallbladder partially contracted without evidence of gallstones or sonographic Murphy sign. Small amount of internal sludge is present. No wall thickening. Common bile duct: Diameter: 6 mm upper normal Liver: Echogenic hepatic parenchyma with nodular contour consistent with cirrhosis. No discrete hepatic mass. Portal vein is patent  on color Doppler imaging with normal direction of blood flow towards the liver. IVC: Normal appearance Pancreas: Normal appearance Spleen: 11.9 cm length, calculated volume 399 mL.  No focal mass. Right Kidney: Length: 11.9 cm. Normal morphology without mass or hydronephrosis. Left Kidney: Length: 12.0 cm. Normal morphology without mass or hydronephrosis. Abdominal aorta: Normal caliber Other findings: Scattered ascites. ULTRASOUND HEPATIC ELASTOGRAPHY Device: Siemens Helix VTQ Patient position: Not recorded Transducer 5C1 Number of measurements: 10 Hepatic segment:  8 Median kPa: 95.8 IQR: 11.6 IQR/Median kPa ratio: 0.1 Data quality:  Good Diagnostic category: > or =17 kPa: highly suggestive of cACLD with an increased probability of clinically significant portal hypertension The use of hepatic elastography is applicable to patients with viral hepatitis and non-alcoholic fatty liver disease. At this time, there is insufficient data for the referenced cut-off values and use in other causes of liver disease, including alcoholic liver disease. Patients, however, may be assessed by elastography and serve as their own reference standard/baseline. In patients with non-alcoholic liver disease, the values suggesting compensated advanced chronic liver disease (cACLD) may be lower, and patients may need additional testing with elasticity results of 7-9 kPa. Please note that abnormal hepatic elasticity and shear wave velocities may also be identified in clinical settings other than with hepatic fibrosis, such as: acute hepatitis, elevated right heart and central venous pressures including use of beta blockers, veno-occlusive disease (Budd-Chiari), infiltrative processes such as mastocytosis/amyloidosis/infiltrative tumor/lymphoma, extrahepatic cholestasis, with hyperemia in the post-prandial state, and with liver transplantation. Correlation with patient history, laboratory data, and clinical condition recommended. Diagnostic  Categories: < or =5 kPa: high probability of being normal < or =9 kPa: in the absence of other known clinical signs, rules out cACLD >9 kPa and ?13 kPa: suggestive of cACLD, but needs further testing >13 kPa: highly suggestive of cACLD > or =17 kPa: highly suggestive of cACLD with an increased probability of clinically significant portal hypertension IMPRESSION: ULTRASOUND ABDOMEN: Cirrhotic appearing liver without focal mass. ULTRASOUND HEPATIC ELASTOGRAPHY: Median kPa:  95.8 Diagnostic category: > or =17 kPa: highly suggestive of cACLD with an increased probability of clinically significant portal hypertension Electronically Signed   By: Lavonia Dana M.D.   On: 05/03/2020 17:09   ECHOCARDIOGRAM COMPLETE  Result Date: 05/15/2020    ECHOCARDIOGRAM REPORT   Patient Name:   LOGHAN MARICONDA Date of Exam: 05/15/2020 Medical Rec #:  [id number]       Height:       60.0 in Accession #:    [id number]      Weight:       127.9 lb Date of Birth:  02/23/1965        BSA:          1.543 m Patient Age:    63 years        BP:           88/64 mmHg Patient Gender: F               HR:           95 bpm. Exam Location:  [facility] Procedure: 2D Echo, Cardiac Doppler and Color Doppler Indications:    Bacteremia R78.81  History:        Patient has prior history of Echocardiogram examinations, most                 recent 02/23/2020. Risk Factors:Dyslipidemia. Cardiac Arrest,                 Elevated Liver Function Test.  Sonographer:    Alvino Chapel RCS Referring Phys: [id number] [place]  1. Left ventricular ejection fraction, by estimation, is 60 to 65%. The left ventricle has normal function. The left ventricle has no regional wall motion abnormalities. Left ventricular diastolic parameters are indeterminate.  2. Right ventricular systolic function is normal. The right ventricular size is normal.  3. Left atrial size was moderately dilated.  4. The mitral valve is normal in structure. No evidence of mitral valve  regurgitation. No evidence of mitral  stenosis.  5. The aortic valve has an indeterminant number of cusps. Aortic valve regurgitation is not visualized. No aortic stenosis is present.  6. The inferior vena cava is normal in size with greater than 50% respiratory variability, suggesting right atrial pressure of 3 mmHg. FINDINGS  Left Ventricle: Left ventricular ejection fraction, by estimation, is 60 to 65%. The left ventricle has normal function. The left ventricle has no regional wall motion abnormalities. The left ventricular internal cavity size was normal in size. There is  no left ventricular hypertrophy. Left ventricular diastolic parameters are indeterminate. Right Ventricle: The right ventricular size is normal. No increase in right ventricular wall thickness. Right ventricular systolic function is normal. Left Atrium: Left atrial size was moderately dilated. Right Atrium: Right atrial size was not well visualized. Pericardium: There is no evidence of pericardial effusion. Mitral Valve: The mitral valve is normal in structure. No evidence of mitral valve regurgitation. No evidence of mitral valve stenosis. Tricuspid Valve: The tricuspid valve is normal in structure. Tricuspid valve regurgitation is not demonstrated. No evidence of tricuspid stenosis. Aortic Valve: The aortic valve has an indeterminant number of cusps. Aortic valve regurgitation is not visualized. No aortic stenosis is present. Aortic valve mean gradient measures 5.1 mmHg. Aortic valve peak gradient measures 8.2 mmHg. Aortic valve area, by VTI measures 2.13 cm. Pulmonic Valve: The pulmonic valve was not well visualized. Pulmonic valve regurgitation is not visualized. No evidence of pulmonic stenosis. Aorta: The aortic root is normal in size and structure. Pulmonary Artery: Inadequate TR jet, indeterminant PASP. Venous: The inferior vena cava is normal in size with greater than 50% respiratory variability, suggesting right atrial pressure of 3  mmHg. IAS/Shunts: No atrial level shunt detected by color flow Doppler.  LEFT VENTRICLE PLAX 2D LVIDd:         4.80 cm  Diastology LVIDs:         3.10 cm  LV e' medial:    10.60 cm/s LV PW:         1.00 cm  LV E/e' medial:  11.3 LV IVS:        1.00 cm  LV e' lateral:   9.14 cm/s LVOT diam:     1.80 cm  LV E/e' lateral: 13.1 LV SV:         64 LV SV Index:   41 LVOT Area:     2.54 cm  RIGHT VENTRICLE RV S prime:     16.30 cm/s TAPSE (M-mode): 2.6 cm LEFT ATRIUM             Index       RIGHT ATRIUM           Index LA diam:        4.20 cm 2.72 cm/m  RA Area:     14.20 cm LA Vol (A2C):   53.8 ml 34.86 ml/m RA Volume:   36.10 ml  23.39 ml/m LA Vol (A4C):   64.2 ml 41.60 ml/m LA Biplane Vol: 62.5 ml 40.49 ml/m  AORTIC VALVE AV Area (Vmax):    2.40 cm AV Area (Vmean):   2.00 cm AV Area (VTI):     2.13 cm AV Vmax:           143.34 cm/s AV Vmean:          109.425 cm/s AV VTI:            0.301 m AV Peak Grad:      8.2 mmHg AV Mean Grad:  5.1 mmHg LVOT Vmax:         135.00 cm/s LVOT Vmean:        86.200 cm/s LVOT VTI:          0.251 m LVOT/AV VTI ratio: 0.84  AORTA Ao Root diam: 3.00 cm MITRAL VALVE MV Area (PHT): 3.97 cm     SHUNTS MV Decel Time: 191 msec     Systemic VTI:  0.25 m MV E velocity: 120.00 cm/s  Systemic Diam: 1.80 cm MV A velocity: 87.30 cm/s MV E/A ratio:  1.37 Carlyle Dolly MD Electronically signed by Carlyle Dolly MD Signature Date/Time: 05/15/2020/2:16:14 PM    Final    ECHO TEE  Result Date: 05/16/2020    TRANSESOPHOGEAL ECHO REPORT   Patient Name:   [name] Date of Exam: 05/16/2020 Medical Rec #:  [id number]       Height:       60.0 in Accession #:    [id number]      Weight:       127.9 lb Date of Birth:  [birth year]        BSA:          1.543 m Patient Age:    86 years        BP:           123/85 mmHg Patient Gender: F               HR:           101 bpm. Exam Location:  [facility] Procedure: Transesophageal Echo, Cardiac Doppler and Color Doppler Indications:    Bacteremia  History:         Patient has prior history of Echocardiogram examinations, most                 recent 05/15/2020. Risk Factors:Dyslipidemia. Cardiac Arrest,                 Elevated Liver Function Test.  Sonographer:    Alvino Chapel RCS Referring Phys: [id number] [place]: The transesophogeal probe was passed without difficulty through the esophogus of the patient. Sedation performed by different physician. The patient developed no complications during the procedure. IMPRESSIONS  1. Left ventricular ejection fraction, by estimation, is 60 to 65%. The left ventricle has normal function.  2. Right ventricular systolic function is normal. The right ventricular size is normal.  3. Left atrial size was mildly dilated. No left atrial/left atrial appendage thrombus was detected. The LAA emptying velocity was 85 cm/s.  4. Right atrial size was mildly dilated.  5. The mitral valve is normal in structure. Trivial mitral valve regurgitation. No evidence of mitral stenosis.  6. The aortic valve is tricuspid. Aortic valve regurgitation is not visualized. No aortic stenosis is present. Conclusion(s)/Recommendation(s): No evidence of vegetation/infective endocarditis on this transesophageal echocardiogram. FINDINGS  Left Ventricle: Left ventricular ejection fraction, by estimation, is 60 to 65%. The left ventricle has normal function. The left ventricular internal cavity size was normal in size. Right Ventricle: The right ventricular size is normal. No increase in right ventricular wall thickness. Right ventricular systolic function is normal. Left Atrium: Left atrial size was mildly dilated. No left atrial/left atrial appendage thrombus was detected. The LAA emptying velocity was 85 cm/s. Right Atrium: Right atrial size was mildly dilated. Pericardium: There is no evidence of pericardial effusion. Mitral Valve: The mitral valve is normal in structure. Trivial mitral valve regurgitation. No evidence of mitral valve  stenosis.  Tricuspid Valve: The tricuspid valve is normal in structure. Tricuspid valve regurgitation is trivial. No evidence of tricuspid stenosis. Aortic Valve: The aortic valve is tricuspid. Aortic valve regurgitation is not visualized. No aortic stenosis is present. Pulmonic Valve: The pulmonic valve was normal in structure. Pulmonic valve regurgitation is not visualized. No evidence of pulmonic stenosis. Aorta: The aortic root is normal in size and structure. IAS/Shunts: No atrial level shunt detected by color flow Doppler. Carlyle Dolly MD Electronically signed by Carlyle Dolly MD Signature Date/Time: 05/16/2020/1:28:24 PM    Final    [name] ASCITES (ABDOMEN LIMITED)  Result Date: 05/14/2020 CLINICAL DATA:  Ascites, question sufficient for paracentesis, clinical concern for spontaneous bacterial peritonitis EXAM: LIMITED ABDOMEN ULTRASOUND FOR ASCITES TECHNIQUE: Limited ultrasound survey for ascites was performed in all four abdominal quadrants. COMPARISON:  CT abdomen and pelvis 05/13/2020 FINDINGS: Small volume ascites identified in the lower quadrants bilaterally. Volume of ascites has probably slightly increased since prior CT. While the volume of fluid is insufficient for therapeutic paracentesis, a small volume of ascites may be obtainable for diagnostic purposes. Diagnostic paracentesis will be attempted. IMPRESSION: Small volume ascites, likely sufficient volume for diagnostic paracentesis. Electronically Signed   By: Lavonia Dana M.D.   On: 05/14/2020 11:11   [name] EKG SITE RITE  Result Date: 05/17/2020 If Site Rite image not attached, placement could not be confirmed due to current cardiac rhythm.   Orson Eva, DO  [facility]  If 7PM-7AM, please contact night-coverage [email or web address] Password [email or web address] 05/19/2020, 3:32 PM   LOS: 6 days

## 2020-05-20 ENCOUNTER — Inpatient Hospital Stay (HOSPITAL_COMMUNITY): Payer: Medicare HMO

## 2020-05-20 DIAGNOSIS — A4101 Sepsis due to Methicillin susceptible Staphylococcus aureus: Secondary | ICD-10-CM | POA: Diagnosis not present

## 2020-05-20 DIAGNOSIS — J9601 Acute respiratory failure with hypoxia: Secondary | ICD-10-CM | POA: Diagnosis not present

## 2020-05-20 DIAGNOSIS — K7469 Other cirrhosis of liver: Secondary | ICD-10-CM

## 2020-05-20 DIAGNOSIS — G40909 Epilepsy, unspecified, not intractable, without status epilepticus: Secondary | ICD-10-CM | POA: Diagnosis not present

## 2020-05-20 LAB — CBC
HCT: 28.3 % — ABNORMAL LOW (ref 36.0–46.0)
Hemoglobin: 9.5 g/dL — ABNORMAL LOW (ref 12.0–15.0)
MCH: 29.8 pg (ref 26.0–34.0)
MCHC: 33.6 g/dL (ref 30.0–36.0)
MCV: 88.7 fL (ref 80.0–100.0)
Platelets: 235 10*3/uL (ref 150–400)
RBC: 3.19 MIL/uL — ABNORMAL LOW (ref 3.87–5.11)
RDW: 16.7 % — ABNORMAL HIGH (ref 11.5–15.5)
WBC: 8.6 10*3/uL (ref 4.0–10.5)
nRBC: 0 % (ref 0.0–0.2)

## 2020-05-20 LAB — COMPREHENSIVE METABOLIC PANEL
ALT: 36 U/L (ref 0–44)
AST: 111 U/L — ABNORMAL HIGH (ref 15–41)
Albumin: 2.3 g/dL — ABNORMAL LOW (ref 3.5–5.0)
Alkaline Phosphatase: 143 U/L — ABNORMAL HIGH (ref 38–126)
Anion gap: 10 (ref 5–15)
BUN: 12 mg/dL (ref 6–20)
CO2: 26 mmol/L (ref 22–32)
Calcium: 7.7 mg/dL — ABNORMAL LOW (ref 8.9–10.3)
Chloride: 101 mmol/L (ref 98–111)
Creatinine, Ser: 0.48 mg/dL (ref 0.44–1.00)
GFR, Estimated: >60 mL/min (ref >60)
Glucose, Bld: 112 mg/dL — ABNORMAL HIGH (ref 70–99)
Potassium: 3.5 mmol/L (ref 3.5–5.1)
Sodium: 137 mmol/L (ref 135–145)
Total Bilirubin: 1.5 mg/dL — ABNORMAL HIGH (ref 0.3–1.2)
Total Protein: 5.4 g/dL — ABNORMAL LOW (ref 6.5–8.1)

## 2020-05-20 LAB — MAGNESIUM: Magnesium: 2.1 mg/dL (ref 1.7–2.4)

## 2020-05-20 MED ORDER — METOPROLOL TARTRATE 25 MG PO TABS
25.0000 mg | ORAL_TABLET | Freq: Two times a day (BID) | ORAL | Status: DC
  Administered 2020-05-20 – 2020-05-21 (×3): 25 mg via ORAL
  Filled 2020-05-20 (×3): qty 1

## 2020-05-20 MED ORDER — PROCHLORPERAZINE EDISYLATE 10 MG/2ML IJ SOLN
10.0000 mg | Freq: Four times a day (QID) | INTRAMUSCULAR | Status: DC | PRN
  Administered 2020-05-20 – 2020-05-21 (×2): 10 mg via INTRAVENOUS
  Filled 2020-05-20 (×2): qty 2

## 2020-05-20 MED ORDER — FENTANYL CITRATE (PF) 100 MCG/2ML IJ SOLN
25.0000 ug | INTRAMUSCULAR | Status: DC | PRN
  Administered 2020-05-20 (×3): 25 ug via INTRAVENOUS
  Filled 2020-05-20 (×2): qty 2

## 2020-05-20 MED ORDER — FENTANYL CITRATE (PF) 100 MCG/2ML IJ SOLN
INTRAMUSCULAR | Status: AC
  Filled 2020-05-20: qty 2

## 2020-05-20 NOTE — TOC Progression Note (Signed)
Transition of Care [facility]) - Progression Note    Patient Details  Name: [name] MRN: [id number] Date of Birth: 1965/03/24  [facility] CM/SW Contact  Natasha Bence, LCSW Phone Number: 05/20/2020, 6:00 PM  Clinical Narrative:    CSW received no bed offer yet. CSW faxed out to additional facilities. TOC to follow.   Expected Discharge Plan: [place] Barriers to Discharge: Continued Medical Work up  Expected Discharge Plan and Services Expected Discharge Plan: [place]: RN,PT [facility] Agency: [place] Date [place]: 05/14/20 Time [place]: [id number] Representative spoke with at [place]: [place] Determinants of Health ([place]) Interventions    Readmission Risk Interventions Readmission Risk Prevention Plan 05/14/2020 02/24/2020 02/14/2020  Medication Screening - Complete Complete  Transportation Screening Complete Complete Complete  Home Care Screening Complete - -  Medication Review (RN CM) Complete - -  Some recent data might be hidden

## 2020-05-20 NOTE — Progress Notes (Signed)
12:50 AM RN notified that patient did not respond to IV Zofran, she was started on IV Compazine.  Due to patient persistent vomiting, IV pain medication was requested in Lieu of p.o., she was started on IV fentanyl as needed for moderate/severe pain.

## 2020-05-20 NOTE — Progress Notes (Signed)
PROGRESS NOTE  [name] [id number] DOB: September 23, 1965 DOA: 05/12/2020 PCP: [name], MD  Brief History: 55 year old female with a history of COPD, meningioma, cardiac arrest December 2020, hepatic steatosis,PNES, cognitive impairment, depression, complex partial seizure presentingwithgeneralized weakness and a fall.This has been a recurrent problem for the patient. She was admitted on 2/7 to 02/24/20 with a similar presentation. At that time she was treated for acute metabolic encephalopathy which was thought to be multifactorial including elevated ammonia, dehydration and dopamine agonist. She was discharged home with HHPT. She follow up with [facility], Dr. 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 She underwent liver elastography on 05/03/20 which suggested cACLD with increased probability of clinically significant portal HTN. She presented on 05/12/20 with another mechanical fall.She reports that her house is being remodeled and she tripped on something and fell forward hit her head on the railing and landed on her right chest. She reports that she hit the back of her head on the way down. She had no loss of consciousness. At presentation her temperature was 101.Blood cultures were done and she was found to have MSSA bacteremia. Also CT of the abd/pelvis on 05/13/20 showed signs of cirrhosis with portal HTN. She also had splenomegaly. THere was also a mildly thickened cecum and ascending colon with diffuse edematous appearance of the sm bowel. She underwent TEE on 05/16/20 after which she remained hypoxic on NRB. She was then transferred to SDU for further care.  Assessment/Plan: Severe Sepsis -present on admission -presented with fever, tachycardia, tachypnea, elevated lactate -due to MSSA bacteremia -Lactic peaked 2.2>>1.8 -UA 6-10 WBC -4/30 CXR--no consolidation -Random cortisol level 17.1 -sepsis  physiology resolved  MSSA Bacteremia -05/12/20 blood culture--positive MSSA -05/14/20 blood cuture--neg to date -continue cefazolin -05/16/20 TEE--no vegetation -PICC line 5/6 if surveillance blood cultures remain neg -plan abx till 5/29  Acute respiratory failure with hypoxia -oxygen saturation 88-90% on NRB in PACU 5/4 -personally reviewed CXR--bilateral interstitial infiltrates -05/16/20 ABG 7.471/32/57/24 (0.8) -continue  lasix 40 mg IV--increase to bid -05/15/20 Echo=EF 60-65%, no WMA, indeterminant diastolic -now down to [id number] [place]>>RA  Chronic pain -d/c dilaudid due to drowsiness -5/8 at 0120--started on fentanyl due to n/v -5/8 afternoon--remains drowsy -5/8 AM RN reports patient tolerating diet without n/v -d/c fentanyl -restart oxycodone  NAFLD/Liver Cirrhosis -05/13/20 CT abd as discussed above -05/14/20 paracentesis--WBC 51, only 20 cc removed -remains hypervolemic  Colonic Wall Thickening -?portal HTN colopathy -no abd pain, no diarrhea  Gait instability/frequent falls/dizziness -PT evaluation-->SNF -CT brain negativeacute findings -No focal deficits on exam -5/3/22echo--EF 60-65%, no WMA  Complex partial seizure/PNES -Continue home dose of Keppra and Lamictal  Depression -Continue fluoxetine 80 mg daily -Continue as needed lorazepam  Meningioma -Patient follows Dr. [name] -s/p fractionated radiosurgery Sept 2020--Dr. [name] -01/18/20 MR brain--mass is stable in size as compared to the brain MRI of 09/13/2019 -no focal deficits on exam  Hypokalemia -replete  Sinus Tachycardia -start metoprolol       Status is: Inpatient  Remains inpatient appropriate because:Hemodynamically unstable and IV treatments appropriate due to intensity of illness or inability to take PO   Dispo: The patient is from:Home Anticipated d/c is [id number] Patient currently is not medically stable to d/c. Difficult to place  patient No        Family Communication:mother updated 5/8  Consultants:Cardiology/GI  Code Status: FULL   DVT Prophylaxis:  Heparin  Procedures: As Listed in Progress Note Above  Antibiotics: Cefazolin 5/2>>     Subjective: Patient complains of "pain all over".  Denies f/c, cp sob, n/v/d, abd pain. Objective: Vitals:   05/20/20 0600 05/20/20 0822 05/20/20 0850 05/20/20 1131  BP: 131/84     Pulse: (!) 108 (!) 110  (!) 127  Resp: 20 11  (!) 28  Temp:  98 F (36.7 C)  98.3 F (36.8 C)  TempSrc:  Oral  Oral  SpO2: 95% 100% 99% 100%  Weight:      Height:        Intake/Output Summary (Last 24 hours) at 05/20/2020 1158 Last data filed at 05/20/2020 0800 Gross per 24 hour  Intake 1070.15 ml  Output 600 ml  Net 470.15 ml   Weight change: -3.6 kg Exam:   General:  Pt is alert, follows commands appropriately, not in acute distress  HEENT: No icterus, No thrush, No neck mass, [place]/AT  Cardiovascular: RRR, S1/S2, no rubs, no gallops  Respiratory: bibasilar crackles. No wheeze  Abdomen: Soft/+BS, non tender, non distended, no guarding  Extremities: 1+LE edema, No lymphangitis, No petechiae, No rashes, no synovitis   Data Reviewed: I have personally reviewed following labs and imaging studies Basic Metabolic Panel: Recent Labs  Lab 05/16/20 1620 05/17/20 0444 05/18/20 0504 05/19/20 0421 05/20/20 0459  NA 134* 136 135 135 137  K 3.3* 3.6 2.9* 3.3* 3.5  CL 104 104 103 101 101  CO2 23 25 25 27 26   GLUCOSE 111* 109* 97 105* 112*  BUN 9 10 12 11 12   CREATININE 0.50 0.45 0.44 0.47 0.48  CALCIUM 7.5* 7.6* 7.5* 7.4* 7.7*  MG  --  1.9 1.9 1.7 2.1   Liver Function Tests: Recent Labs  Lab 05/14/20 0421 05/16/20 1620 05/17/20 0444 05/20/20 0459  AST 127* 109* 104* 111*  ALT 58* 43 41 36  ALKPHOS 126 131* 138* 143*  BILITOT 1.4* 1.8* 1.6* 1.5*  PROT 5.3* 5.3* 5.1* 5.4*  ALBUMIN 2.5* 2.3* 2.3* 2.3*   No results for input(s):  LIPASE, AMYLASE in the last 168 hours. No results for input(s): AMMONIA in the last 168 hours. Coagulation Profile: No results for input(s): INR, PROTIME in the last 168 hours. CBC: Recent Labs  Lab 05/14/20 0421 05/16/20 1620 05/17/20 0444 05/20/20 0459  WBC 10.0 11.2* 13.1* 8.6  HGB 9.4* 10.0* 9.7* 9.5*  HCT 28.2* 29.1* 28.9* 28.3*  MCV 92.5 89.3 89.8 88.7  PLT 171 197 219 235   Cardiac Enzymes: No results for input(s): CKTOTAL, CKMB, CKMBINDEX, TROPONINI in the last 168 hours. BNP: Invalid input(s): POCBNP CBG: No results for input(s): GLUCAP in the last 168 hours. HbA1C: No results for input(s): HGBA1C in the last 72 hours. Urine analysis:    Component Value Date/Time   COLORURINE YELLOW 05/13/2020 0141   APPEARANCEUR CLEAR 05/13/2020 0141   APPEARANCEUR Clear 01/30/2017 1020   LABSPEC 1.043 (H) 05/13/2020 0141   PHURINE 6.0 05/13/2020 0141   GLUCOSEU NEGATIVE 05/13/2020 0141   HGBUR SMALL (A) 05/13/2020 0141   BILIRUBINUR NEGATIVE 05/13/2020 0141   BILIRUBINUR Negative 01/30/2017 1020   KETONESUR NEGATIVE 05/13/2020 0141   PROTEINUR NEGATIVE 05/13/2020 0141   UROBILINOGEN negative 04/19/2014 1332   UROBILINOGEN 0.2 12/04/2013 1414   NITRITE POSITIVE (A) 05/13/2020 0141   LEUKOCYTESUR NEGATIVE 05/13/2020 0141   Sepsis Labs: @LABRCNTIP (procalcitonin:4,lacticidven:4) ) Recent Results (from the past 240 hour(s))  Culture, blood (routine x 2)     Status: Abnormal   Collection Time:  05/12/20 10:45 PM   Specimen: BLOOD LEFT FOREARM  Result Value Ref Range Status   Specimen Description   Final    BLOOD LEFT FOREARM Performed at [facility], [address] [place], [place] [address]    Special Requests   Final    Blood Culture results may not be optimal due to an excessive volume of blood received in culture bottles BOTTLES DRAWN AEROBIC AND ANAEROBIC Performed at [facility], [address] [place], [place] [address]    Culture  Setup Time   Final    GRAM  POSITIVE COCCI BOTH ANAEROBIC AND AEROBIC BOTTLES Gram Stain Report Called to,Read Back By and Verified With: GANTT,E@1419  BY MATTHEWS, B 5.1.22 Organism ID to follow CRITICAL RESULT CALLED TO, READ BACK BY AND VERIFIED WITH: N,ONDARA RN @2130  05/13/20 AH  Performed at [facility], [place] [address] [place],  [address]    Culture STAPHYLOCOCCUS AUREUS (A)  Final   Report Status 05/15/2020 FINAL  Final   Organism ID, Bacteria STAPHYLOCOCCUS AUREUS  Final      Susceptibility   Staphylococcus aureus - MIC*    CIPROFLOXACIN >=8 RESISTANT Resistant     ERYTHROMYCIN >=8 RESISTANT Resistant     GENTAMICIN <=0.5 SENSITIVE Sensitive     OXACILLIN <=0.25 SENSITIVE Sensitive     TETRACYCLINE <=1 SENSITIVE Sensitive     VANCOMYCIN <=0.5 SENSITIVE Sensitive     TRIMETH/SULFA <=10 SENSITIVE Sensitive     CLINDAMYCIN <=0.25 SENSITIVE Sensitive     RIFAMPIN <=0.5 SENSITIVE Sensitive     Inducible Clindamycin NEGATIVE Sensitive     * STAPHYLOCOCCUS AUREUS  Blood Culture ID Panel (Reflexed)     Status: Abnormal   Collection Time: 05/12/20 10:45 PM  Result Value Ref Range Status   Enterococcus faecalis NOT DETECTED NOT DETECTED Final   Enterococcus Faecium NOT DETECTED NOT DETECTED Final   Listeria monocytogenes NOT DETECTED NOT DETECTED Final   Staphylococcus species DETECTED (A) NOT DETECTED Final    Comment: CRITICAL RESULT CALLED TO, READ BACK BY AND VERIFIED WITH: NACKSON ONDARA,RN AT 2132 05/13/2020 A.HUGHES    Staphylococcus aureus (BCID) DETECTED (A) NOT DETECTED Final    Comment: CRITICAL RESULT CALLED TO, READ BACK BY AND VERIFIED WITH: Valentino Hue, RN AT  2132 05/13/2020 A.HUGHES    Staphylococcus epidermidis NOT DETECTED NOT DETECTED Final   Staphylococcus lugdunensis NOT DETECTED NOT DETECTED Final   Streptococcus species NOT DETECTED NOT DETECTED Final   Streptococcus agalactiae NOT DETECTED NOT DETECTED Final   Streptococcus pneumoniae NOT DETECTED NOT DETECTED  Final   Streptococcus pyogenes NOT DETECTED NOT DETECTED Final   A.calcoaceticus-baumannii NOT DETECTED NOT DETECTED Final   Bacteroides fragilis NOT DETECTED NOT DETECTED Final   Enterobacterales NOT DETECTED NOT DETECTED Final   Enterobacter cloacae complex NOT DETECTED NOT DETECTED Final   Escherichia coli NOT DETECTED NOT DETECTED Final   Klebsiella aerogenes NOT DETECTED NOT DETECTED Final   Klebsiella oxytoca NOT DETECTED NOT DETECTED Final   Klebsiella pneumoniae NOT DETECTED NOT DETECTED Final   Proteus species NOT DETECTED NOT DETECTED Final   Salmonella species NOT DETECTED NOT DETECTED Final   Serratia marcescens NOT DETECTED NOT DETECTED Final   Haemophilus influenzae NOT DETECTED NOT DETECTED Final   Neisseria meningitidis NOT DETECTED NOT DETECTED Final   Pseudomonas aeruginosa NOT DETECTED NOT DETECTED Final   Stenotrophomonas maltophilia NOT DETECTED NOT DETECTED Final   Candida albicans NOT DETECTED NOT DETECTED Final   Candida auris NOT DETECTED NOT DETECTED Final  Candida glabrata NOT DETECTED NOT DETECTED Final   Candida krusei NOT DETECTED NOT DETECTED Final   Candida parapsilosis NOT DETECTED NOT DETECTED Final   Candida tropicalis NOT DETECTED NOT DETECTED Final   Cryptococcus neoformans/gattii NOT DETECTED NOT DETECTED Final   Meth resistant mecA/C and MREJ NOT DETECTED NOT DETECTED Final    Comment: Performed at [facility] [address] [place],  [place] [address]  Resp Panel by RT-PCR (Flu A&B, Covid) Nasopharyngeal Swab     Status: None   Collection Time: 05/12/20 10:46 PM   Specimen: Nasopharyngeal Swab; Nasopharyngeal(NP) swabs in vial transport medium  Result Value Ref Range Status   SARS Coronavirus 2 by RT PCR NEGATIVE NEGATIVE Final    Comment: (NOTE) SARS-CoV-2 target nucleic acids are NOT DETECTED.  The SARS-CoV-2 RNA is generally detectable in upper respiratory specimens during the acute phase of infection. The lowest concentration of  SARS-CoV-2 viral copies this assay can detect is 138 copies/mL. A negative result does not preclude SARS-Cov-2 infection and should not be used as the sole basis for treatment or other patient management decisions. A negative result may occur with  improper specimen collection/handling, submission of specimen other than nasopharyngeal swab, presence of viral mutation(s) within the areas targeted by this assay, and inadequate number of viral copies(<138 copies/mL). A negative result must be combined with clinical observations, patient history, and epidemiological information. The expected result is Negative.  Fact Sheet for Patients:  EntrepreneurPulse.com.au  Fact Sheet for Healthcare Providers:  IncredibleEmployment.be  This test is no t yet approved or cleared by the [place] FDA and  has been authorized for detection and/or diagnosis of SARS-CoV-2 by FDA under an Emergency Use Authorization (EUA). This EUA will remain  in effect (meaning this test can be used) for the duration of the COVID-19 declaration under Section 564(b)(1) of the Act, 21 U.S.C.section 360bbb-3(b)(1), unless the authorization is terminated  or revoked sooner.       Influenza A by PCR NEGATIVE NEGATIVE Final   Influenza B by PCR NEGATIVE NEGATIVE Final    Comment: (NOTE) The Xpert Xpress SARS-CoV-2/FLU/RSV plus assay is intended as an aid in the diagnosis of influenza from Nasopharyngeal swab specimens and should not be used as a sole basis for treatment. Nasal washings and aspirates are unacceptable for Xpert Xpress SARS-CoV-2/FLU/RSV testing.  Fact Sheet for Patients: EntrepreneurPulse.com.au  Fact Sheet for Healthcare Providers: IncredibleEmployment.be  This test is not yet approved or cleared by the [place] FDA and has been authorized for detection and/or diagnosis of SARS-CoV-2 by FDA under an Emergency Use  Authorization (EUA). This EUA will remain in effect (meaning this test can be used) for the duration of the COVID-19 declaration under Section 564(b)(1) of the Act, 21 U.S.C. section 360bbb-3(b)(1), unless the authorization is terminated or revoked.  Performed at [facility], [address], [place], [place] [address]   Culture, blood (routine x 2)     Status: Abnormal   Collection Time: 05/12/20 11:10 PM   Specimen: BLOOD RIGHT HAND  Result Value Ref Range Status   Specimen Description   Final    BLOOD RIGHT HAND Performed at [facility], [address], [place], [place] [address]    Special Requests   Final    Blood Culture adequate volume BOTTLES DRAWN AEROBIC AND ANAEROBIC Performed at [facility], [address] [place], [place] [address]    Culture  Setup Time   Final    GRAM POSITIVE COCCI ANAEROBIC AND  AEROBIC BOTTLES Gram Stain Report Called to,Read Back By and Verified With: GANTT,E@1419  BY MATTHEWS B 5.1.22 CRITICAL VALUE NOTED.  VALUE IS CONSISTENT WITH PREVIOUSLY REPORTED AND CALLED VALUE.    Culture (A)  Final    STAPHYLOCOCCUS AUREUS SUSCEPTIBILITIES PERFORMED ON PREVIOUS CULTURE WITHIN THE LAST 5 DAYS. Performed at [facility], [place] [address] [place], [place] [address]    Report Status 05/15/2020 FINAL  Final  Culture, Urine     Status: Abnormal   Collection Time: 05/13/20  1:41 AM   Specimen: Urine, Clean Catch  Result Value Ref Range Status   Specimen Description   Final    URINE, CLEAN CATCH Performed at [facility], [address], [place], [place] [address]    Special Requests   Final    NONE Performed at [facility], [address] [place], [place] [address]    Culture >=100,000 COLONIES/mL STAPHYLOCOCCUS AUREUS (A)  Final   Report Status 05/16/2020 FINAL  Final   Organism ID, Bacteria STAPHYLOCOCCUS AUREUS (A)  Final      Susceptibility   Staphylococcus aureus - MIC*    CIPROFLOXACIN >=8 RESISTANT Resistant     GENTAMICIN <=0.5  SENSITIVE Sensitive     NITROFURANTOIN <=16 SENSITIVE Sensitive     OXACILLIN <=0.25 SENSITIVE Sensitive     TETRACYCLINE <=1 SENSITIVE Sensitive     VANCOMYCIN <=0.5 SENSITIVE Sensitive     TRIMETH/SULFA <=10 SENSITIVE Sensitive     CLINDAMYCIN <=0.25 SENSITIVE Sensitive     RIFAMPIN <=0.5 SENSITIVE Sensitive     Inducible Clindamycin NEGATIVE Sensitive     * >=100,000 COLONIES/mL STAPHYLOCOCCUS AUREUS  Culture, blood (single)     Status: None   Collection Time: 05/14/20 10:08 AM   Specimen: BLOOD  Result Value Ref Range Status   Specimen Description BLOOD BLOOD LEFT HAND  Final   Special Requests   Final    Blood Culture results may not be optimal due to an inadequate volume of blood received in culture bottles BOTTLES DRAWN AEROBIC AND ANAEROBIC   Culture   Final    NO GROWTH 5 DAYS Performed at [facility], [address], [place], [place] [address]    Report Status 05/19/2020 FINAL  Final  Body fluid culture w Gram Stain     Status: None   Collection Time: 05/14/20 12:16 PM   Specimen: Ascitic; Body Fluid  Result Value Ref Range Status   Specimen Description   Final    ASCITIC Performed at [facility], [address] [place], [place] [address]    Special Requests   Final    NONE Performed at [facility], [address], [place], [place] [address]    Gram Stain NO WBC SEEN NO ORGANISMS SEEN   Final   Culture   Final    NO GROWTH 3 DAYS Performed at [facility], [place] [address] [place],  [address]    Report Status 05/17/2020 FINAL  Final  MRSA PCR Screening     Status: None   Collection Time: 05/16/20  2:57 PM   Specimen: Nasal Mucosa; Nasopharyngeal  Result Value Ref Range Status   MRSA by PCR NEGATIVE NEGATIVE Final    Comment:        The GeneXpert MRSA Assay (FDA approved for NASAL specimens only), is one component of a comprehensive MRSA colonization surveillance program. It is not intended to diagnose MRSA infection nor to guide or monitor  treatment for MRSA infections. Performed at [facility], [address], [place], [place] [address]      Scheduled Meds: . Chlorhexidine Gluconate Cloth  6 each Topical Daily  . feeding supplement  237 mL Oral BID BM  . folic acid  1 mg Oral Daily  . furosemide  40 mg Intravenous BID  . heparin  5,000 Units Subcutaneous Q8H  . lactobacillus  1 g Oral TID WC  . lamoTRIgine  150 mg Oral BID  . levETIRAcetam  1,000 mg Oral BID  . metoprolol tartrate  25 mg Oral BID  . potassium chloride  40 mEq Oral BID  . sodium chloride flush  10-40 mL Intracatheter Q12H   Continuous Infusions: .  ceFAZolin (ANCEF) IV 2 g (05/20/20 0901)    Procedures/Studies: CT HEAD WO CONTRAST  Result Date: 05/13/2020 CLINICAL DATA:  Fall 2 days prior EXAM: CT HEAD WITHOUT CONTRAST TECHNIQUE: Contiguous axial images were obtained from the base of the skull through the vertex without intravenous contrast. COMPARISON:  CT 02/20/2020 FINDINGS: Brain: No evidence of acute infarction, hemorrhage, hydrocephalus, extra-axial collection, visible mass lesion or mass effect. Vascular: Atherosclerotic calcification of the carotid siphons. No hyperdense vessel. Skull: No calvarial fracture or suspicious osseous lesion. No scalp swelling or hematoma. Sinuses/Orbits: Paranasal sinuses are predominantly clear. Mastoid air cells appear chronically hypo pneumatized. Middle ear cavities are clear. Debris in the bilateral external auditory canals. Included orbital structures are unremarkable. Other: Asymmetric thickening and calcification in the left parotid gland is a chronic, nonspecific finding, similar to comparison study. IMPRESSION: No acute intracranial abnormality. No calvarial fracture, significant scalp swelling or hematoma. Intracranial atherosclerosis. Asymmetric thickening and calcification in the left parotid gland, a nonspecific, chronic finding unchanged from prior. Can reflect sequela of a chronic inflammatory process.  Debris in the external auditory canals, correlate for cerumen impaction. Electronically Signed   By: Lovena Le M.D.   On: 05/13/2020 00:44   CT ABDOMEN PELVIS W CONTRAST  Result Date: 05/13/2020 CLINICAL DATA:  Abdominal pain, fever, right upper quadrant pain EXAM: CT ABDOMEN AND PELVIS WITH CONTRAST TECHNIQUE: Multidetector CT imaging of the abdomen and pelvis was performed using the standard protocol following bolus administration of intravenous contrast. CONTRAST:  120mL OMNIPAQUE IOHEXOL 300 MG/ML  SOLN COMPARISON:  CT 02/12/2020 FINDINGS: Lower chest: Basilar atelectatic changes in the lungs including more bandlike areas of subsegmental atelectasis or scarring. Normal heart size. No pericardial effusion. Hepatobiliary: Heterogeneous hepatic attenuation with a nodular liver surface contour concerning for intrinsic liver disease/cirrhosis. No concerning focal liver lesion. Moderate gallbladder distension. No significant gallbladder wall thickening or pericholecystic inflammation is seen. No visible calcified gallstones or biliary ductal dilatation. Pancreas: Slightly edematous appearance of the pancreatic parenchyma with very faint peripancreatic haze, uniform enhancement. No pancreatic ductal dilatation. Spleen: Mild splenomegaly.  No concerning focal splenic lesion. Adrenals/Urinary Tract: Normal adrenals. Kidneys enhance symmetrically. Symmetric and uniform excretion. No concerning focal renal lesion. Bilateral renal cortical scarring. Nonobstructing calculi present in both kidneys. No obstructive urolithiasis or hydronephrosis is seen at this time. Bladder is unremarkable for the degree of distention. Stomach/Bowel: Paraesophageal venous collaterals and varices. Some minimal collateralization and varices about the gastric fundus as well. Thickening towards the gastric antrum likely reflecting normal peristalsis. Air and fluid-filled appearance of the duodenum with a normal sweep across the midline  abdomen. Slightly edematous appearance diffusely through the small bowel is nonspecific given additional findings above. Moderate right-sided colonic stool burden, particularly within the cecum. Some mild circumferential thickening and mucosal hyperemia about the cecum and ascending colon  is nonspecific given hepatic features. No evidence of mechanical obstruction. Appendix is not well visualized. Vascular/Lymphatic: Atherosclerotic calcifications within the abdominal aorta and branch vessels. No aneurysm or ectasia. Paraesophageal and gastric varices with venous collaterals, as above. Splenorenal collateralization. Upper abdominal varices.No enlarged abdominopelvic lymph nodes. Few scattered calcified lymph nodes are present in the abdomen and pelvis. Reproductive: Portion the pelvis obscured by streak artifact. 2.6 cm cystic focus in the left adnexa, unchanged from comparison. No concerning right adnexal lesion. Prior hysterectomy. Other: Increasing edematous changes of the central mesentery. Small volume low-attenuation fluid in the deep pelvis as well as trace perihepatic ascites. Circumferential body wall edema. No bowel containing hernia. No free abdominopelvic air. Musculoskeletal: Multilevel degenerative changes are present in the imaged portions of the spine. Grade 1 anterolisthesis L4 on L5 without spondylolysis, unchanged from prior. Prior left hip arthroplasty with chronic bony remodeling and a similar appearance overall to comparison prior. IMPRESSION: Constellation of features suggestive of intrinsic liver disease/cirrhosis with features of portal hypertension including a nodular, heterogeneous liver with paraesophageal and gastric varices, additional upper abdominal venous collateralization, splenorenal shunting and splenomegaly. Interval development of a small volume ascites as well as additional features of at least mild or early developing anasarca with circumferential body wall edema and central  mesenteric edema. Some hazy peripancreatic stranding may be present, possibly related to a diffusely edematous appearance of the mesentery though could correlate with lipase. Mildly thickened appearance of the cecum and ascending colon as well as a diffusely edematous appearance of the small bowel is nonspecific in the setting of suspected intrinsic liver disease. Could reflect portal enteropathy/colopathy though should exclude clinical symptoms of enterocolitis. Bilateral nonobstructing nephrolithiasis. No obstructive urolithiasis or hydronephrosis at this time. Bilateral renal cortical scarring. Prior hysterectomy. Stable 2.7 cm left ovarian cyst. Continued stability favoring a benign process. No follow-up imaging recommended. Note: This recommendation does not apply to premenarchal patients and to those with increased risk (genetic, family history, elevated tumor markers or other high-risk factors) of ovarian cancer. Reference: JACR 2020 Feb; 17(2):248-254 Aortic Atherosclerosis (ICD10-I70.0). Electronically Signed   By: Lovena Le M.D.   On: 05/13/2020 00:35   US Abdomen Limited  Result Date: 05/13/2020 CLINICAL DATA:  Right upper quadrant pain. EXAM: ULTRASOUND ABDOMEN LIMITED RIGHT UPPER QUADRANT COMPARISON:  February 23, 2020 ultrasound.  CT scan May 12, 2020. FINDINGS: Gallbladder: A small amount of sludge is seen in the gallbladder. No stones, Murphy's sign, or wall thickening. A small amount of ascites is identified, including adjacent to the gallbladder. Common bile duct: Diameter: 5 mm Liver: There is a nodular contour in the liver. No focal mass. Portal vein is patent on color Doppler imaging with normal direction of blood flow towards the liver. Other: Mild ascites. IMPRESSION: 1. Nodular contour to the liver suggesting cirrhosis.  Mild ascites. 2. Mild sludge in otherwise normal appearing gallbladder. A small amount of fluid adjacent to the gallbladder is likely due to the ascites.  Electronically Signed   By: Dorise Bullion III M.D   On: 05/13/2020 09:06   US Paracentesis  Result Date: 05/14/2020 INDICATION: Fever, abdominal pain, question spontaneous bacterial peritonitis, ascites on CT EXAM: ULTRASOUND GUIDED DIAGNOSTIC PARACENTESIS MEDICATIONS: None COMPLICATIONS: None immediate PROCEDURE: Informed written consent was obtained from the patient after a discussion of the risks, benefits and alternatives to treatment. A timeout was performed prior to the initiation of the procedure. Initial ultrasound scanning demonstrates a small amount of ascites within the right lower abdominal quadrant. The right  lower abdomen was prepped and draped in the usual sterile fashion. 1% lidocaine was used for local anesthesia. Following this, a 5 [place] Yueh catheter was introduced. An ultrasound image was saved for documentation purposes. The paracentesis was performed. The catheter was removed and a dressing was applied. The patient tolerated the procedure well without immediate post procedural complication. FINDINGS: A total of approximately 20 mL of clear yellow ascitic fluid was removed. Samples were sent to the laboratory as requested by the clinical team. IMPRESSION: Successful ultrasound-guided paracentesis yielding 20 mL of peritoneal fluid for diagnostic testing. Electronically Signed   By: Lavonia Dana M.D.   On: 05/14/2020 12:39   DG CHEST PORT 1 VIEW  Result Date: 05/20/2020 CLINICAL DATA:  Pulmonary edema EXAM: PORTABLE CHEST 1 VIEW COMPARISON:  05/16/2020 chest radiograph. FINDINGS: Right PICC terminates over the right atrium. Stable cardiomediastinal silhouette with normal heart size. No pneumothorax. No pleural effusion. Patchy hazy opacities throughout both lungs, significantly improved. IMPRESSION: Significantly improved patchy hazy opacities throughout both lungs, compatible with improving pulmonary edema. Electronically Signed   By: Ilona Sorrel M.D.   On: 05/20/2020 07:32   DG  CHEST PORT 1 VIEW  Result Date: 05/16/2020 CLINICAL DATA:  Shortness of breath. EXAM: PORTABLE CHEST 1 VIEW COMPARISON:  Chest x-ray dated May 12, 2020. FINDINGS: The heart size and mediastinal contours are within normal limits. Prominent diffuse interstitial thickening with bilateral perihilar opacities. Small bilateral pleural effusions. No pneumothorax. No acute osseous abnormality. IMPRESSION: 1. Moderate to severe pulmonary edema with small bilateral pleural effusions. Electronically Signed   By: Titus Dubin M.D.   On: 05/16/2020 12:57   DG Chest Portable 1 View  Result Date: 05/12/2020 CLINICAL DATA:  Right sided rib pain. EXAM: PORTABLE CHEST 1 VIEW COMPARISON:  February 12, 2020 FINDINGS: A very mild amount of linear atelectasis is seen within the right lung base. There is no evidence of acute infiltrate, pleural effusion or pneumothorax. The heart size and mediastinal contours are within normal limits. A chronic seventh right rib fracture is seen. IMPRESSION: 1. Very mild right basilar linear atelectasis. 2. Chronic seventh right rib fracture. Electronically Signed   By: Virgina Norfolk M.D.   On: 05/12/2020 23:16   US ABDOMEN COMPLETE W/ELASTOGRAPHY  Result Date: 05/03/2020 CLINICAL DATA:  Fatty liver EXAM: ULTRASOUND ABDOMEN ULTRASOUND HEPATIC ELASTOGRAPHY TECHNIQUE: Sonography of the upper abdomen was performed. In addition, ultrasound elastography evaluation of the liver was performed. A region of interest was placed within the right lobe of the liver. Following application of a compressive sonographic pulse, tissue compressibility was assessed. Multiple assessments were performed at the selected site. Median tissue compressibility was determined. Previously, hepatic stiffness was assessed by shear wave velocity. Based on recently published Society of Radiologists in Ultrasound consensus article, reporting is now recommended to be performed in the SI units of pressure (kiloPascals)  representing hepatic stiffness/elasticity. The obtained result is compared to the published reference standards. (cACLD = compensated Advanced Chronic Liver Disease) COMPARISON:  02/23/2020 FINDINGS: ULTRASOUND ABDOMEN Gallbladder: Gallbladder partially contracted without evidence of gallstones or sonographic Murphy sign. Small amount of internal sludge is present. No wall thickening. Common bile duct: Diameter: 6 mm upper normal Liver: Echogenic hepatic parenchyma with nodular contour consistent with cirrhosis. No discrete hepatic mass. Portal vein is patent on color Doppler imaging with normal direction of blood flow towards the liver. IVC: Normal appearance Pancreas: Normal appearance Spleen: 11.9 cm length, calculated volume 399 mL.  No focal mass. Right Kidney: Length: 11.9  cm. Normal morphology without mass or hydronephrosis. Left Kidney: Length: 12.0 cm. Normal morphology without mass or hydronephrosis. Abdominal aorta: Normal caliber Other findings: Scattered ascites. ULTRASOUND HEPATIC ELASTOGRAPHY Device: Siemens Helix VTQ Patient position: Not recorded Transducer 5C1 Number of measurements: 10 Hepatic segment:  8 Median kPa: 95.8 IQR: 11.6 IQR/Median kPa ratio: 0.1 Data quality:  Good Diagnostic category: > or =17 kPa: highly suggestive of cACLD with an increased probability of clinically significant portal hypertension The use of hepatic elastography is applicable to patients with viral hepatitis and non-alcoholic fatty liver disease. At this time, there is insufficient data for the referenced cut-off values and use in other causes of liver disease, including alcoholic liver disease. Patients, however, may be assessed by elastography and serve as their own reference standard/baseline. In patients with non-alcoholic liver disease, the values suggesting compensated advanced chronic liver disease (cACLD) may be lower, and patients may need additional testing with elasticity results of 7-9 kPa. Please note  that abnormal hepatic elasticity and shear wave velocities may also be identified in clinical settings other than with hepatic fibrosis, such as: acute hepatitis, elevated right heart and central venous pressures including use of beta blockers, veno-occlusive disease (Budd-Chiari), infiltrative processes such as mastocytosis/amyloidosis/infiltrative tumor/lymphoma, extrahepatic cholestasis, with hyperemia in the post-prandial state, and with liver transplantation. Correlation with patient history, laboratory data, and clinical condition recommended. Diagnostic Categories: < or =5 kPa: high probability of being normal < or =9 kPa: in the absence of other known clinical signs, rules out cACLD >9 kPa and ?13 kPa: suggestive of cACLD, but needs further testing >13 kPa: highly suggestive of cACLD > or =17 kPa: highly suggestive of cACLD with an increased probability of clinically significant portal hypertension IMPRESSION: ULTRASOUND ABDOMEN: Cirrhotic appearing liver without focal mass. ULTRASOUND HEPATIC ELASTOGRAPHY: Median kPa:  95.8 Diagnostic category: > or =17 kPa: highly suggestive of cACLD with an increased probability of clinically significant portal hypertension Electronically Signed   By: Lavonia Dana M.D.   On: 05/03/2020 17:09   ECHOCARDIOGRAM COMPLETE  Result Date: 05/15/2020    ECHOCARDIOGRAM REPORT   Patient Name:   UDANA KUDLA Date of Exam: 05/15/2020 Medical Rec #:  [id number]       Height:       60.0 in Accession #:    [id number]      Weight:       127.9 lb Date of Birth:  02/16/1965        BSA:          1.543 m Patient Age:    51 years        BP:           88/64 mmHg Patient Gender: F               HR:           95 bpm. Exam Location:  [facility] Procedure: 2D Echo, Cardiac Doppler and Color Doppler Indications:    Bacteremia R78.81  History:        Patient has prior history of Echocardiogram examinations, most                 recent 02/23/2020. Risk Factors:Dyslipidemia. Cardiac Arrest,                  Elevated Liver Function Test.  Sonographer:    Alvino Chapel RCS Referring Phys: [id number] [place]  1. Left ventricular ejection fraction, by estimation, is 60 to 65%. The left ventricle  has normal function. The left ventricle has no regional wall motion abnormalities. Left ventricular diastolic parameters are indeterminate.  2. Right ventricular systolic function is normal. The right ventricular size is normal.  3. Left atrial size was moderately dilated.  4. The mitral valve is normal in structure. No evidence of mitral valve regurgitation. No evidence of mitral stenosis.  5. The aortic valve has an indeterminant number of cusps. Aortic valve regurgitation is not visualized. No aortic stenosis is present.  6. The inferior vena cava is normal in size with greater than 50% respiratory variability, suggesting right atrial pressure of 3 mmHg. FINDINGS  Left Ventricle: Left ventricular ejection fraction, by estimation, is 60 to 65%. The left ventricle has normal function. The left ventricle has no regional wall motion abnormalities. The left ventricular internal cavity size was normal in size. There is  no left ventricular hypertrophy. Left ventricular diastolic parameters are indeterminate. Right Ventricle: The right ventricular size is normal. No increase in right ventricular wall thickness. Right ventricular systolic function is normal. Left Atrium: Left atrial size was moderately dilated. Right Atrium: Right atrial size was not well visualized. Pericardium: There is no evidence of pericardial effusion. Mitral Valve: The mitral valve is normal in structure. No evidence of mitral valve regurgitation. No evidence of mitral valve stenosis. Tricuspid Valve: The tricuspid valve is normal in structure. Tricuspid valve regurgitation is not demonstrated. No evidence of tricuspid stenosis. Aortic Valve: The aortic valve has an indeterminant number of cusps. Aortic valve regurgitation is not  visualized. No aortic stenosis is present. Aortic valve mean gradient measures 5.1 mmHg. Aortic valve peak gradient measures 8.2 mmHg. Aortic valve area, by VTI measures 2.13 cm. Pulmonic Valve: The pulmonic valve was not well visualized. Pulmonic valve regurgitation is not visualized. No evidence of pulmonic stenosis. Aorta: The aortic root is normal in size and structure. Pulmonary Artery: Inadequate TR jet, indeterminant PASP. Venous: The inferior vena cava is normal in size with greater than 50% respiratory variability, suggesting right atrial pressure of 3 mmHg. IAS/Shunts: No atrial level shunt detected by color flow Doppler.  LEFT VENTRICLE PLAX 2D LVIDd:         4.80 cm  Diastology LVIDs:         3.10 cm  LV e' medial:    10.60 cm/s LV PW:         1.00 cm  LV E/e' medial:  11.3 LV IVS:        1.00 cm  LV e' lateral:   9.14 cm/s LVOT diam:     1.80 cm  LV E/e' lateral: 13.1 LV SV:         64 LV SV Index:   41 LVOT Area:     2.54 cm  RIGHT VENTRICLE RV S prime:     16.30 cm/s TAPSE (M-mode): 2.6 cm LEFT ATRIUM             Index       RIGHT ATRIUM           Index LA diam:        4.20 cm 2.72 cm/m  RA Area:     14.20 cm LA Vol (A2C):   53.8 ml 34.86 ml/m RA Volume:   36.10 ml  23.39 ml/m LA Vol (A4C):   64.2 ml 41.60 ml/m LA Biplane Vol: 62.5 ml 40.49 ml/m  AORTIC VALVE AV Area (Vmax):    2.40 cm AV Area (Vmean):   2.00 cm AV Area (VTI):  2.13 cm AV Vmax:           143.34 cm/s AV Vmean:          109.425 cm/s AV VTI:            0.301 m AV Peak Grad:      8.2 mmHg AV Mean Grad:      5.1 mmHg LVOT Vmax:         135.00 cm/s LVOT Vmean:        86.200 cm/s LVOT VTI:          0.251 m LVOT/AV VTI ratio: 0.84  AORTA Ao Root diam: 3.00 cm MITRAL VALVE MV Area (PHT): 3.97 cm     SHUNTS MV Decel Time: 191 msec     Systemic VTI:  0.25 m MV E velocity: 120.00 cm/s  Systemic Diam: 1.80 cm MV A velocity: 87.30 cm/s MV E/A ratio:  1.37 Carlyle Dolly MD Electronically signed by Carlyle Dolly MD Signature  Date/Time: 05/15/2020/2:16:14 PM    Final    ECHO TEE  Result Date: 05/16/2020    TRANSESOPHOGEAL ECHO REPORT   Patient Name:   [name] Date of Exam: 05/16/2020 Medical Rec #:  [id number]       Height:       60.0 in Accession #:    [id number]      Weight:       127.9 lb Date of Birth:  1965-04-14        BSA:          1.543 m Patient Age:    75 years        BP:           123/85 mmHg Patient Gender: F               HR:           101 bpm. Exam Location:  [facility] Procedure: Transesophageal Echo, Cardiac Doppler and Color Doppler Indications:    Bacteremia  History:        Patient has prior history of Echocardiogram examinations, most                 recent 05/15/2020. Risk Factors:Dyslipidemia. Cardiac Arrest,                 Elevated Liver Function Test.  Sonographer:    Alvino Chapel RCS Referring Phys: [id number] [place]: The transesophogeal probe was passed without difficulty through the esophogus of the patient. Sedation performed by different physician. The patient developed no complications during the procedure. IMPRESSIONS  1. Left ventricular ejection fraction, by estimation, is 60 to 65%. The left ventricle has normal function.  2. Right ventricular systolic function is normal. The right ventricular size is normal.  3. Left atrial size was mildly dilated. No left atrial/left atrial appendage thrombus was detected. The LAA emptying velocity was 85 cm/s.  4. Right atrial size was mildly dilated.  5. The mitral valve is normal in structure. Trivial mitral valve regurgitation. No evidence of mitral stenosis.  6. The aortic valve is tricuspid. Aortic valve regurgitation is not visualized. No aortic stenosis is present. Conclusion(s)/Recommendation(s): No evidence of vegetation/infective endocarditis on this transesophageal echocardiogram. FINDINGS  Left Ventricle: Left ventricular ejection fraction, by estimation, is 60 to 65%. The left ventricle has normal function. The left ventricular  internal cavity size was normal in size. Right Ventricle: The right ventricular size is normal. No increase in right ventricular wall thickness. Right ventricular  systolic function is normal. Left Atrium: Left atrial size was mildly dilated. No left atrial/left atrial appendage thrombus was detected. The LAA emptying velocity was 85 cm/s. Right Atrium: Right atrial size was mildly dilated. Pericardium: There is no evidence of pericardial effusion. Mitral Valve: The mitral valve is normal in structure. Trivial mitral valve regurgitation. No evidence of mitral valve stenosis. Tricuspid Valve: The tricuspid valve is normal in structure. Tricuspid valve regurgitation is trivial. No evidence of tricuspid stenosis. Aortic Valve: The aortic valve is tricuspid. Aortic valve regurgitation is not visualized. No aortic stenosis is present. Pulmonic Valve: The pulmonic valve was normal in structure. Pulmonic valve regurgitation is not visualized. No evidence of pulmonic stenosis. Aorta: The aortic root is normal in size and structure. IAS/Shunts: No atrial level shunt detected by color flow Doppler. Carlyle Dolly MD Electronically signed by Carlyle Dolly MD Signature Date/Time: 05/16/2020/1:28:24 PM    Final    [name] ASCITES (ABDOMEN LIMITED)  Result Date: 05/14/2020 CLINICAL DATA:  Ascites, question sufficient for paracentesis, clinical concern for spontaneous bacterial peritonitis EXAM: LIMITED ABDOMEN ULTRASOUND FOR ASCITES TECHNIQUE: Limited ultrasound survey for ascites was performed in all four abdominal quadrants. COMPARISON:  CT abdomen and pelvis 05/13/2020 FINDINGS: Small volume ascites identified in the lower quadrants bilaterally. Volume of ascites has probably slightly increased since prior CT. While the volume of fluid is insufficient for therapeutic paracentesis, a small volume of ascites may be obtainable for diagnostic purposes. Diagnostic paracentesis will be attempted. IMPRESSION: Small volume ascites,  likely sufficient volume for diagnostic paracentesis. Electronically Signed   By: Lavonia Dana M.D.   On: 05/14/2020 11:11   [name] EKG SITE RITE  Result Date: 05/17/2020 If Site Rite image not attached, placement could not be confirmed due to current cardiac rhythm.   Orson Eva, DO  [facility]  If 7PM-7AM, please contact night-coverage [email or web address] Password [email or web address] 05/20/2020, 11:58 AM   LOS: 7 days  `

## 2020-05-21 DIAGNOSIS — A4101 Sepsis due to Methicillin susceptible Staphylococcus aureus: Secondary | ICD-10-CM | POA: Diagnosis not present

## 2020-05-21 DIAGNOSIS — J9601 Acute respiratory failure with hypoxia: Secondary | ICD-10-CM | POA: Diagnosis not present

## 2020-05-21 DIAGNOSIS — R188 Other ascites: Secondary | ICD-10-CM | POA: Diagnosis not present

## 2020-05-21 DIAGNOSIS — D329 Benign neoplasm of meninges, unspecified: Secondary | ICD-10-CM | POA: Diagnosis not present

## 2020-05-21 LAB — URINALYSIS, COMPLETE (UACMP) WITH MICROSCOPIC
Bacteria, UA: NONE SEEN
Bilirubin Urine: NEGATIVE
Glucose, UA: NEGATIVE mg/dL
Ketones, ur: NEGATIVE mg/dL
Leukocytes,Ua: NEGATIVE
Nitrite: NEGATIVE
Protein, ur: NEGATIVE mg/dL
RBC / HPF: >50 RBC/hpf — ABNORMAL HIGH (ref 0–5)
Specific Gravity, Urine: 1.017 (ref 1.005–1.030)
pH: 6 (ref 5.0–8.0)

## 2020-05-21 MED ORDER — METOPROLOL TARTRATE 25 MG PO TABS
12.5000 mg | ORAL_TABLET | Freq: Two times a day (BID) | ORAL | Status: DC
  Administered 2020-05-21 – 2020-05-27 (×9): 12.5 mg via ORAL
  Filled 2020-05-21 (×12): qty 1

## 2020-05-21 MED ORDER — METOPROLOL TARTRATE 25 MG PO TABS: 12.5000 mg | ORAL_TABLET | Freq: Two times a day (BID) | ORAL | Status: AC

## 2020-05-21 MED ORDER — OXYCODONE HCL 5 MG PO TABS
2.5000 mg | ORAL_TABLET | Freq: Four times a day (QID) | ORAL | Status: DC | PRN
  Administered 2020-05-21 – 2020-05-26 (×2): 2.5 mg via ORAL
  Filled 2020-05-21 (×2): qty 1

## 2020-05-21 NOTE — Progress Notes (Signed)
PROGRESS NOTE  [name] [id number] DOB: [birth year] DOA: 05/12/2020 PCP: [name], MD  Brief History:  55 year old female with a history of COPD, meningioma, cardiac arrest December 2020, hepatic steatosis,PNES, cognitive impairment, depression, complex partial seizure presentingwithgeneralized weakness and a fall.This has been a recurrent problem for the patient. She was admitted on 2/7 to 02/24/20 with a similar presentation. At that time she was treated for acute metabolic encephalopathy which was thought to be multifactorial including elevated ammonia, dehydration and dopamine agonist. She was discharged home with HHPT. She follow up with [facility], Dr. 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 She underwent liver elastography on 05/03/20 which suggested cACLD with increased probability of clinically significant portal HTN. She presented on 05/12/20 with another mechanical fall.She reports that her house is being remodeled and she tripped on something and fell forward hit her head on the railing and landed on her right chest. She reports that she hit the back of her head on the way down. She had no loss of consciousness. At presentation her temperature was 101.Blood cultures were done and she was found to have MSSA bacteremia. Also CT of the abd/pelvis on 05/13/20 showed signs of cirrhosis with portal HTN. She also had splenomegaly. THere was also a mildly thickened cecum and ascending colon with diffuse edematous appearance of the sm bowel. She underwent TEE on 05/16/20 after which she remained hypoxic on NRB. She was then transferred to SDU for further care.  She was started on IV lasix with clinical improvement.  Assessment/Plan: Severe Sepsis -present on admission -presented with fever, tachycardia, tachypnea, elevated lactate -due to MSSA bacteremia -Lactic peaked 2.2>>1.8 -UA 6-10 WBC -4/30CXR--no  consolidation -Random cortisol level 17.1 -sepsis physiology resolved  MSSA Bacteremia -05/12/20 blood culture--positive MSSA -05/14/20 blood cuture--neg to date -continue cefazolin -05/16/20 TEE--no vegetation -PICC line 5/6 if surveillance blood cultures remain neg -plan abx till 5/29  Acute respiratory failure with hypoxia -oxygen saturation 88-90% on NRB in PACU 5/4 -personally reviewed CXR--bilateral interstitial infiltrates -5/4/22ABG 7.471/32/57/24 (0.8) -continuelasix 40 mg IV--increase to bid -05/15/20 Echo=EF 60-65%, no WMA, indeterminant diastolic -now down to [id number] [place]>>RA -plan to discharge on lasix 40 mg po bid  Chronic pain -d/c dilaudid due to drowsiness -5/8 at 0120--started on fentanyl due to n/v -5/8 afternoon--remains drowsy -5/8 AM RN reports patient tolerating diet without n/v -d/c fentanyl -restarted oxycodone>>cut to 1/2 dose  NAFLD/Liver Cirrhosis -05/13/20 CT abd as discussed above -05/14/20 paracentesis--WBC 51, only 20 cc removed -remains hypervolemic  Colonic Wall Thickening -?portal HTN colopathy -no abd pain, no diarrhea; tolerating diet  Gait instability/frequent falls/dizziness -PT evaluation-->SNF -CT brain negativeacute findings -No focal deficits on exam -5/3/22echo--EF 60-65%, no WMA  Complex partial seizure/PNES -Continue home dose of Keppra and Lamictal  Depression -Continue fluoxetine 80 mg daily -Continue as needed lorazepam  Meningioma -Patient follows Dr. [name] -s/p fractionated radiosurgery Sept 2020--Dr. [name] -01/18/20 MR brain--mass is stable in size as compared to the brain MRI of 09/13/2019 -no focal deficits on exam  Hypokalemia -repleted  Transaminasemia -stable -due to NAFLD and hepatic congestion -05/14/20 RUQ US--Nodular contour to the liver suggesting cirrhosis. Mild ascites.  Mild sludge in otherwise normal appearing gallbladder. A small amount of fluid adjacent to the gallbladder is likely due to  the ascites.  Restless Leg -previously discontinued mirapex due to dopamine agonist causing confusion -confusion improved -remain off mirapex  Status is: Inpatient  Remains inpatient appropriate because:IV treatments appropriate due to intensity of illness or inability to take PO   Dispo: The patient is from: Home              Anticipated d/c is to: SNF              Patient currently is not medically stable to d/c.   Difficult to place patient No        Family Communication:   Mother updated at bedside 5/9  Consultants:  none  Code Status:  FULL  DVT Prophylaxis:  [place] Heparin    Procedures: As Listed in Progress Note Above  Antibiotics: Cefazolin  5/2>>       Subjective: Patient complains of leg pain.  Denies f/c, cp, sob, n/v/d, abd pain, headache  Objective: Vitals:   05/21/20 1200 05/21/20 1300 05/21/20 1332 05/21/20 1400  BP: 104/60 107/77 101/60 114/71  Pulse:   84 87  Resp: (!) 21 15    Temp:      TempSrc:      SpO2:    94%  Weight:      Height:        Intake/Output Summary (Last 24 hours) at 05/21/2020 1443 Last data filed at 05/21/2020 0957 Gross per 24 hour  Intake 458.36 ml  Output 1250 ml  Net -791.64 ml   Weight change:  Exam:   General:  Pt is alert, follows commands appropriately, not in acute distress  HEENT: No icterus, No thrush, No neck mass, [place]/AT  Cardiovascular: RRR, S1/S2, no rubs, no gallops  Respiratory: bibasilar crackles. No wheeze  Abdomen: Soft/+BS, non tender, non distended, no guarding  Extremities: trace LE edema, No lymphangitis, No petechiae, No rashes, no synovitis   Data Reviewed: I have personally reviewed following labs and imaging studies Basic Metabolic Panel: Recent Labs  Lab 05/16/20 1620 05/17/20 0444 05/18/20 0504 05/19/20 0421 05/20/20 0459  NA 134* 136 135 135 137  K 3.3* 3.6 2.9* 3.3* 3.5  CL 104 104 103 101 101  CO2 23 25 25 27 26   GLUCOSE 111* 109* 97 105* 112*  BUN 9  10 12 11 12   CREATININE 0.50 0.45 0.44 0.47 0.48  CALCIUM 7.5* 7.6* 7.5* 7.4* 7.7*  MG  --  1.9 1.9 1.7 2.1   Liver Function Tests: Recent Labs  Lab 05/16/20 1620 05/17/20 0444 05/20/20 0459  AST 109* 104* 111*  ALT 43 41 36  ALKPHOS 131* 138* 143*  BILITOT 1.8* 1.6* 1.5*  PROT 5.3* 5.1* 5.4*  ALBUMIN 2.3* 2.3* 2.3*   No results for input(s): LIPASE, AMYLASE in the last 168 hours. No results for input(s): AMMONIA in the last 168 hours. Coagulation Profile: No results for input(s): INR, PROTIME in the last 168 hours. CBC: Recent Labs  Lab 05/16/20 1620 05/17/20 0444 05/20/20 0459  WBC 11.2* 13.1* 8.6  HGB 10.0* 9.7* 9.5*  HCT 29.1* 28.9* 28.3*  MCV 89.3 89.8 88.7  PLT 197 219 235   Cardiac Enzymes: No results for input(s): CKTOTAL, CKMB, CKMBINDEX, TROPONINI in the last 168 hours. BNP: Invalid input(s): POCBNP CBG: No results for input(s): GLUCAP in the last 168 hours. HbA1C: No results for input(s): HGBA1C in the last 72 hours. Urine analysis:    Component Value Date/Time   COLORURINE YELLOW 05/13/2020 0141   APPEARANCEUR CLEAR 05/13/2020 0141   APPEARANCEUR Clear 01/30/2017 1020   LABSPEC 1.043 (H) 05/13/2020 0141   PHURINE 6.0 05/13/2020 0141   GLUCOSEU  NEGATIVE 05/13/2020 0141   HGBUR SMALL (A) 05/13/2020 0141   BILIRUBINUR NEGATIVE 05/13/2020 0141   BILIRUBINUR Negative 01/30/2017 1020   KETONESUR NEGATIVE 05/13/2020 0141   PROTEINUR NEGATIVE 05/13/2020 0141   UROBILINOGEN negative 04/19/2014 1332   UROBILINOGEN 0.2 12/04/2013 1414   NITRITE POSITIVE (A) 05/13/2020 0141   LEUKOCYTESUR NEGATIVE 05/13/2020 0141   Sepsis Labs: @LABRCNTIP (procalcitonin:4,lacticidven:4) ) Recent Results (from the past 240 hour(s))  Culture, blood (routine x 2)     Status: Abnormal   Collection Time: 05/12/20 10:45 PM   Specimen: BLOOD LEFT FOREARM  Result Value Ref Range Status   Specimen Description   Final    BLOOD LEFT FOREARM Performed at [facility],  [address] [place], [place] [address]    Special Requests   Final    Blood Culture results may not be optimal due to an excessive volume of blood received in culture bottles BOTTLES DRAWN AEROBIC AND ANAEROBIC Performed at [facility], [address], [place], [place] [address]    Culture  Setup Time   Final    GRAM POSITIVE COCCI BOTH ANAEROBIC AND AEROBIC BOTTLES Gram Stain Report Called to,Read Back By and Verified With: GANTT,E@1419  BY MATTHEWS, B 5.1.22 Organism ID to follow CRITICAL RESULT CALLED TO, READ BACK BY AND VERIFIED WITH: N,ONDARA RN @2130  05/13/20 AH  Performed at [facility], [place] [address] [place], [place] [address]    Culture STAPHYLOCOCCUS AUREUS (A)  Final   Report Status 05/15/2020 FINAL  Final   Organism ID, Bacteria STAPHYLOCOCCUS AUREUS  Final      Susceptibility   Staphylococcus aureus - MIC*    CIPROFLOXACIN >=8 RESISTANT Resistant     ERYTHROMYCIN >=8 RESISTANT Resistant     GENTAMICIN <=0.5 SENSITIVE Sensitive     OXACILLIN <=0.25 SENSITIVE Sensitive     TETRACYCLINE <=1 SENSITIVE Sensitive     VANCOMYCIN <=0.5 SENSITIVE Sensitive     TRIMETH/SULFA <=10 SENSITIVE Sensitive     CLINDAMYCIN <=0.25 SENSITIVE Sensitive     RIFAMPIN <=0.5 SENSITIVE Sensitive     Inducible Clindamycin NEGATIVE Sensitive     * STAPHYLOCOCCUS AUREUS  Blood Culture ID Panel (Reflexed)     Status: Abnormal   Collection Time: 05/12/20 10:45 PM  Result Value Ref Range Status   Enterococcus faecalis NOT DETECTED NOT DETECTED Final   Enterococcus Faecium NOT DETECTED NOT DETECTED Final   Listeria monocytogenes NOT DETECTED NOT DETECTED Final   Staphylococcus species DETECTED (A) NOT DETECTED Final    Comment: CRITICAL RESULT CALLED TO, READ BACK BY AND VERIFIED WITH: NACKSON ONDARA,RN AT 2132 05/13/2020 A.HUGHES    Staphylococcus aureus (BCID) DETECTED (A) NOT DETECTED Final    Comment: CRITICAL RESULT CALLED TO, READ BACK BY AND VERIFIED WITH: Valentino Hue, RN AT   2132 05/13/2020 A.HUGHES    Staphylococcus epidermidis NOT DETECTED NOT DETECTED Final   Staphylococcus lugdunensis NOT DETECTED NOT DETECTED Final   Streptococcus species NOT DETECTED NOT DETECTED Final   Streptococcus agalactiae NOT DETECTED NOT DETECTED Final   Streptococcus pneumoniae NOT DETECTED NOT DETECTED Final   Streptococcus pyogenes NOT DETECTED NOT DETECTED Final   A.calcoaceticus-baumannii NOT DETECTED NOT DETECTED Final   Bacteroides fragilis NOT DETECTED NOT DETECTED Final   Enterobacterales NOT DETECTED NOT DETECTED Final   Enterobacter cloacae complex NOT DETECTED NOT DETECTED Final   Escherichia coli NOT DETECTED NOT DETECTED Final   Klebsiella aerogenes NOT DETECTED NOT DETECTED Final   Klebsiella oxytoca NOT DETECTED NOT DETECTED Final  Klebsiella pneumoniae NOT DETECTED NOT DETECTED Final   Proteus species NOT DETECTED NOT DETECTED Final   Salmonella species NOT DETECTED NOT DETECTED Final   Serratia marcescens NOT DETECTED NOT DETECTED Final   Haemophilus influenzae NOT DETECTED NOT DETECTED Final   Neisseria meningitidis NOT DETECTED NOT DETECTED Final   Pseudomonas aeruginosa NOT DETECTED NOT DETECTED Final   Stenotrophomonas maltophilia NOT DETECTED NOT DETECTED Final   Candida albicans NOT DETECTED NOT DETECTED Final   Candida auris NOT DETECTED NOT DETECTED Final   Candida glabrata NOT DETECTED NOT DETECTED Final   Candida krusei NOT DETECTED NOT DETECTED Final   Candida parapsilosis NOT DETECTED NOT DETECTED Final   Candida tropicalis NOT DETECTED NOT DETECTED Final   Cryptococcus neoformans/gattii NOT DETECTED NOT DETECTED Final   Meth resistant mecA/C and MREJ NOT DETECTED NOT DETECTED Final    Comment: Performed at [facility], [address] [place], [place] [address]  Resp Panel by RT-PCR (Flu A&B, Covid) Nasopharyngeal Swab     Status: None   Collection Time: 05/12/20 10:46 PM   Specimen: Nasopharyngeal Swab; Nasopharyngeal(NP) swabs in  vial transport medium  Result Value Ref Range Status   SARS Coronavirus 2 by RT PCR NEGATIVE NEGATIVE Final    Comment: (NOTE) SARS-CoV-2 target nucleic acids are NOT DETECTED.  The SARS-CoV-2 RNA is generally detectable in upper respiratory specimens during the acute phase of infection. The lowest concentration of SARS-CoV-2 viral copies this assay can detect is 138 copies/mL. A negative result does not preclude SARS-Cov-2 infection and should not be used as the sole basis for treatment or other patient management decisions. A negative result may occur with  improper specimen collection/handling, submission of specimen other than nasopharyngeal swab, presence of viral mutation(s) within the areas targeted by this assay, and inadequate number of viral copies(<138 copies/mL). A negative result must be combined with clinical observations, patient history, and epidemiological information. The expected result is Negative.  Fact Sheet for Patients:  BloggerCourse.com  Fact Sheet for Healthcare Providers:  SeriousBroker.it  This test is no t yet approved or cleared by the [place] FDA and  has been authorized for detection and/or diagnosis of SARS-CoV-2 by FDA under an Emergency Use Authorization (EUA). This EUA will remain  in effect (meaning this test can be used) for the duration of the COVID-19 declaration under Section 564(b)(1) of the Act, 21 U.S.C.section 360bbb-3(b)(1), unless the authorization is terminated  or revoked sooner.       Influenza A by PCR NEGATIVE NEGATIVE Final   Influenza B by PCR NEGATIVE NEGATIVE Final    Comment: (NOTE) The Xpert Xpress SARS-CoV-2/FLU/RSV plus assay is intended as an aid in the diagnosis of influenza from Nasopharyngeal swab specimens and should not be used as a sole basis for treatment. Nasal washings and aspirates are unacceptable for Xpert Xpress SARS-CoV-2/FLU/RSV testing.  Fact  Sheet for Patients: BloggerCourse.com  Fact Sheet for Healthcare Providers: SeriousBroker.it  This test is not yet approved or cleared by the [place] FDA and has been authorized for detection and/or diagnosis of SARS-CoV-2 by FDA under an Emergency Use Authorization (EUA). This EUA will remain in effect (meaning this test can be used) for the duration of the COVID-19 declaration under Section 564(b)(1) of the Act, 21 U.S.C. section 360bbb-3(b)(1), unless the authorization is terminated or revoked.  Performed at [facility], [address], [place], [place] [address]   Culture, blood (routine x 2)     Status: Abnormal  Collection Time: 05/12/20 11:10 PM   Specimen: BLOOD RIGHT HAND  Result Value Ref Range Status   Specimen Description   Final    BLOOD RIGHT HAND Performed at [facility], [address] [place], [place] [address]    Special Requests   Final    Blood Culture adequate volume BOTTLES DRAWN AEROBIC AND ANAEROBIC Performed at [facility], [address], [place], [place] [address]    Culture  Setup Time   Final    GRAM POSITIVE COCCI ANAEROBIC AND AEROBIC BOTTLES Gram Stain Report Called to,Read Back By and Verified With: GANTT,E@1419  BY MATTHEWS B 5.1.22 CRITICAL VALUE NOTED.  VALUE IS CONSISTENT WITH PREVIOUSLY REPORTED AND CALLED VALUE.    Culture (A)  Final    STAPHYLOCOCCUS AUREUS SUSCEPTIBILITIES PERFORMED ON PREVIOUS CULTURE WITHIN THE LAST 5 DAYS. Performed at [facility], [place] [address] [place], [place] [address]    Report Status 05/15/2020 FINAL  Final  Culture, Urine     Status: Abnormal   Collection Time: 05/13/20  1:41 AM   Specimen: Urine, Clean Catch  Result Value Ref Range Status   Specimen Description   Final    URINE, CLEAN CATCH Performed at [facility], [address] [place], [place] [address]    Special Requests   Final    NONE Performed at [facility], [address], [place], [place] [address]    Culture >=100,000 COLONIES/mL STAPHYLOCOCCUS AUREUS (A)  Final   Report Status 05/16/2020 FINAL  Final   Organism ID, Bacteria STAPHYLOCOCCUS AUREUS (A)  Final      Susceptibility   Staphylococcus aureus - MIC*    CIPROFLOXACIN >=8 RESISTANT Resistant     GENTAMICIN <=0.5 SENSITIVE Sensitive     NITROFURANTOIN <=16 SENSITIVE Sensitive     OXACILLIN <=0.25 SENSITIVE Sensitive     TETRACYCLINE <=1 SENSITIVE Sensitive     VANCOMYCIN <=0.5 SENSITIVE Sensitive     TRIMETH/SULFA <=10 SENSITIVE Sensitive     CLINDAMYCIN <=0.25 SENSITIVE Sensitive     RIFAMPIN <=0.5 SENSITIVE Sensitive     Inducible Clindamycin NEGATIVE Sensitive     * >=100,000 COLONIES/mL STAPHYLOCOCCUS AUREUS  Culture, blood (single)     Status: None   Collection Time: 05/14/20 10:08 AM   Specimen: BLOOD  Result Value Ref Range Status   Specimen Description BLOOD BLOOD LEFT HAND  Final   Special Requests   Final    Blood Culture results may not be optimal due to an inadequate volume of blood received in culture bottles BOTTLES DRAWN AEROBIC AND ANAEROBIC   Culture   Final    NO GROWTH 5 DAYS Performed at [facility], [address] [place], [place] [address]    Report Status 05/19/2020 FINAL  Final  Body fluid culture w Gram Stain     Status: None   Collection Time: 05/14/20 12:16 PM   Specimen: Ascitic; Body Fluid  Result Value Ref Range Status   Specimen Description   Final    ASCITIC Performed at [facility], [address], [place], [place] [address]    Special Requests   Final    NONE Performed at [facility], [address] [place], [place] [address]    Gram Stain NO WBC SEEN NO ORGANISMS SEEN   Final   Culture   Final    NO GROWTH 3 DAYS Performed at [facility], [place] [address] [place], [place] [address]    Report Status 05/17/2020 FINAL  Final  MRSA PCR Screening     Status: None   Collection Time: 05/16/20  2:57 PM   Specimen: Nasal Mucosa; Nasopharyngeal   Result Value Ref Range Status   MRSA by PCR NEGATIVE NEGATIVE Final    Comment:        The GeneXpert MRSA Assay (FDA approved for NASAL specimens only), is one component of a comprehensive MRSA colonization surveillance program. It is not intended to diagnose MRSA infection nor to guide or monitor treatment for MRSA infections. Performed at [facility], [address] [place], [place] [address]      Scheduled Meds: . Chlorhexidine Gluconate Cloth  6 each Topical Daily  . feeding supplement  237 mL Oral BID BM  . folic acid  1 mg Oral Daily  . furosemide  40 mg Intravenous BID  . heparin  5,000 Units Subcutaneous Q8H  . lactobacillus  1 g Oral TID WC  . lamoTRIgine  150 mg Oral BID  . levETIRAcetam  1,000 mg Oral BID  . metoprolol tartrate  12.5 mg Oral BID  . potassium chloride  40 mEq Oral BID  . sodium chloride flush  10-40 mL Intracatheter Q12H   Continuous Infusions: .  ceFAZolin (ANCEF) IV 200 mL/hr at 05/21/20 0830    Procedures/Studies: CT HEAD WO CONTRAST  Result Date: 05/13/2020 CLINICAL DATA:  Fall 2 days prior EXAM: CT HEAD WITHOUT CONTRAST TECHNIQUE: Contiguous axial images were obtained from the base of the skull through the vertex without intravenous contrast. COMPARISON:  CT 02/20/2020 FINDINGS: Brain: No evidence of acute infarction, hemorrhage, hydrocephalus, extra-axial collection, visible mass lesion or mass effect. Vascular: Atherosclerotic calcification of the carotid siphons. No hyperdense vessel. Skull: No calvarial fracture or suspicious osseous lesion. No scalp swelling or hematoma. Sinuses/Orbits: Paranasal sinuses are predominantly clear. Mastoid air cells appear chronically hypo pneumatized. Middle ear cavities are clear. Debris in the bilateral external auditory canals. Included orbital structures are unremarkable. Other: Asymmetric thickening and calcification in the left parotid gland is a chronic, nonspecific finding, similar to comparison study.  IMPRESSION: No acute intracranial abnormality. No calvarial fracture, significant scalp swelling or hematoma. Intracranial atherosclerosis. Asymmetric thickening and calcification in the left parotid gland, a nonspecific, chronic finding unchanged from prior. Can reflect sequela of a chronic inflammatory process. Debris in the external auditory canals, correlate for cerumen impaction. Electronically Signed   By: Lovena Le M.D.   On: 05/13/2020 00:44   CT ABDOMEN PELVIS W CONTRAST  Result Date: 05/13/2020 CLINICAL DATA:  Abdominal pain, fever, right upper quadrant pain EXAM: CT ABDOMEN AND PELVIS WITH CONTRAST TECHNIQUE: Multidetector CT imaging of the abdomen and pelvis was performed using the standard protocol following bolus administration of intravenous contrast. CONTRAST:  199mL OMNIPAQUE IOHEXOL 300 MG/ML  SOLN COMPARISON:  CT 02/12/2020 FINDINGS: Lower chest: Basilar atelectatic changes in the lungs including more bandlike areas of subsegmental atelectasis or scarring. Normal heart size. No pericardial effusion. Hepatobiliary: Heterogeneous hepatic attenuation with a nodular liver surface contour concerning for intrinsic liver disease/cirrhosis. No concerning focal liver lesion. Moderate gallbladder distension. No significant gallbladder wall thickening or pericholecystic inflammation is seen. No visible calcified gallstones or biliary ductal dilatation. Pancreas: Slightly edematous appearance of the pancreatic parenchyma with very faint peripancreatic haze, uniform enhancement. No pancreatic ductal dilatation. Spleen: Mild splenomegaly.  No concerning focal splenic lesion. Adrenals/Urinary Tract: Normal adrenals. Kidneys enhance symmetrically. Symmetric and uniform excretion. No concerning focal renal lesion. Bilateral renal cortical scarring. Nonobstructing calculi present in both kidneys. No obstructive urolithiasis or hydronephrosis  is seen at this time. Bladder is unremarkable for the degree of  distention. Stomach/Bowel: Paraesophageal venous collaterals and varices. Some minimal collateralization and varices about the gastric fundus as well. Thickening towards the gastric antrum likely reflecting normal peristalsis. Air and fluid-filled appearance of the duodenum with a normal sweep across the midline abdomen. Slightly edematous appearance diffusely through the small bowel is nonspecific given additional findings above. Moderate right-sided colonic stool burden, particularly within the cecum. Some mild circumferential thickening and mucosal hyperemia about the cecum and ascending colon is nonspecific given hepatic features. No evidence of mechanical obstruction. Appendix is not well visualized. Vascular/Lymphatic: Atherosclerotic calcifications within the abdominal aorta and branch vessels. No aneurysm or ectasia. Paraesophageal and gastric varices with venous collaterals, as above. Splenorenal collateralization. Upper abdominal varices.No enlarged abdominopelvic lymph nodes. Few scattered calcified lymph nodes are present in the abdomen and pelvis. Reproductive: Portion the pelvis obscured by streak artifact. 2.6 cm cystic focus in the left adnexa, unchanged from comparison. No concerning right adnexal lesion. Prior hysterectomy. Other: Increasing edematous changes of the central mesentery. Small volume low-attenuation fluid in the deep pelvis as well as trace perihepatic ascites. Circumferential body wall edema. No bowel containing hernia. No free abdominopelvic air. Musculoskeletal: Multilevel degenerative changes are present in the imaged portions of the spine. Grade 1 anterolisthesis L4 on L5 without spondylolysis, unchanged from prior. Prior left hip arthroplasty with chronic bony remodeling and a similar appearance overall to comparison prior. IMPRESSION: Constellation of features suggestive of intrinsic liver disease/cirrhosis with features of portal hypertension including a nodular, heterogeneous  liver with paraesophageal and gastric varices, additional upper abdominal venous collateralization, splenorenal shunting and splenomegaly. Interval development of a small volume ascites as well as additional features of at least mild or early developing anasarca with circumferential body wall edema and central mesenteric edema. Some hazy peripancreatic stranding may be present, possibly related to a diffusely edematous appearance of the mesentery though could correlate with lipase. Mildly thickened appearance of the cecum and ascending colon as well as a diffusely edematous appearance of the small bowel is nonspecific in the setting of suspected intrinsic liver disease. Could reflect portal enteropathy/colopathy though should exclude clinical symptoms of enterocolitis. Bilateral nonobstructing nephrolithiasis. No obstructive urolithiasis or hydronephrosis at this time. Bilateral renal cortical scarring. Prior hysterectomy. Stable 2.7 cm left ovarian cyst. Continued stability favoring a benign process. No follow-up imaging recommended. Note: This recommendation does not apply to premenarchal patients and to those with increased risk (genetic, family history, elevated tumor markers or other high-risk factors) of ovarian cancer. Reference: JACR 2020 Feb; 17(2):248-254 Aortic Atherosclerosis (ICD10-I70.0). Electronically Signed   By: Lovena Le M.D.   On: 05/13/2020 00:35   US Abdomen Limited  Result Date: 05/13/2020 CLINICAL DATA:  Right upper quadrant pain. EXAM: ULTRASOUND ABDOMEN LIMITED RIGHT UPPER QUADRANT COMPARISON:  February 23, 2020 ultrasound.  CT scan May 12, 2020. FINDINGS: Gallbladder: A small amount of sludge is seen in the gallbladder. No stones, Murphy's sign, or wall thickening. A small amount of ascites is identified, including adjacent to the gallbladder. Common bile duct: Diameter: 5 mm Liver: There is a nodular contour in the liver. No focal mass. Portal vein is patent on color Doppler  imaging with normal direction of blood flow towards the liver. Other: Mild ascites. IMPRESSION: 1. Nodular contour to the liver suggesting cirrhosis.  Mild ascites. 2. Mild sludge in otherwise normal appearing gallbladder. A small amount of fluid adjacent to the gallbladder is likely due to the ascites. Electronically  Signed   By: Dorise Bullion III M.D   On: 05/13/2020 09:06   US Paracentesis  Result Date: 05/14/2020 INDICATION: Fever, abdominal pain, question spontaneous bacterial peritonitis, ascites on CT EXAM: ULTRASOUND GUIDED DIAGNOSTIC PARACENTESIS MEDICATIONS: None COMPLICATIONS: None immediate PROCEDURE: Informed written consent was obtained from the patient after a discussion of the risks, benefits and alternatives to treatment. A timeout was performed prior to the initiation of the procedure. Initial ultrasound scanning demonstrates a small amount of ascites within the right lower abdominal quadrant. The right lower abdomen was prepped and draped in the usual sterile fashion. 1% lidocaine was used for local anesthesia. Following this, a 5 [place] Yueh catheter was introduced. An ultrasound image was saved for documentation purposes. The paracentesis was performed. The catheter was removed and a dressing was applied. The patient tolerated the procedure well without immediate post procedural complication. FINDINGS: A total of approximately 20 mL of clear yellow ascitic fluid was removed. Samples were sent to the laboratory as requested by the clinical team. IMPRESSION: Successful ultrasound-guided paracentesis yielding 20 mL of peritoneal fluid for diagnostic testing. Electronically Signed   By: Lavonia Dana M.D.   On: 05/14/2020 12:39   DG CHEST PORT 1 VIEW  Result Date: 05/20/2020 CLINICAL DATA:  Pulmonary edema EXAM: PORTABLE CHEST 1 VIEW COMPARISON:  05/16/2020 chest radiograph. FINDINGS: Right PICC terminates over the right atrium. Stable cardiomediastinal silhouette with normal heart size. No  pneumothorax. No pleural effusion. Patchy hazy opacities throughout both lungs, significantly improved. IMPRESSION: Significantly improved patchy hazy opacities throughout both lungs, compatible with improving pulmonary edema. Electronically Signed   By: Ilona Sorrel M.D.   On: 05/20/2020 07:32   DG CHEST PORT 1 VIEW  Result Date: 05/16/2020 CLINICAL DATA:  Shortness of breath. EXAM: PORTABLE CHEST 1 VIEW COMPARISON:  Chest x-ray dated May 12, 2020. FINDINGS: The heart size and mediastinal contours are within normal limits. Prominent diffuse interstitial thickening with bilateral perihilar opacities. Small bilateral pleural effusions. No pneumothorax. No acute osseous abnormality. IMPRESSION: 1. Moderate to severe pulmonary edema with small bilateral pleural effusions. Electronically Signed   By: Titus Dubin M.D.   On: 05/16/2020 12:57   DG Chest Portable 1 View  Result Date: 05/12/2020 CLINICAL DATA:  Right sided rib pain. EXAM: PORTABLE CHEST 1 VIEW COMPARISON:  February 12, 2020 FINDINGS: A very mild amount of linear atelectasis is seen within the right lung base. There is no evidence of acute infiltrate, pleural effusion or pneumothorax. The heart size and mediastinal contours are within normal limits. A chronic seventh right rib fracture is seen. IMPRESSION: 1. Very mild right basilar linear atelectasis. 2. Chronic seventh right rib fracture. Electronically Signed   By: Virgina Norfolk M.D.   On: 05/12/2020 23:16   US ABDOMEN COMPLETE W/ELASTOGRAPHY  Result Date: 05/03/2020 CLINICAL DATA:  Fatty liver EXAM: ULTRASOUND ABDOMEN ULTRASOUND HEPATIC ELASTOGRAPHY TECHNIQUE: Sonography of the upper abdomen was performed. In addition, ultrasound elastography evaluation of the liver was performed. A region of interest was placed within the right lobe of the liver. Following application of a compressive sonographic pulse, tissue compressibility was assessed. Multiple assessments were performed at the  selected site. Median tissue compressibility was determined. Previously, hepatic stiffness was assessed by shear wave velocity. Based on recently published Society of Radiologists in Ultrasound consensus article, reporting is now recommended to be performed in the SI units of pressure (kiloPascals) representing hepatic stiffness/elasticity. The obtained result is compared to the published reference standards. (cACLD = compensated Advanced Chronic  Liver Disease) COMPARISON:  02/23/2020 FINDINGS: ULTRASOUND ABDOMEN Gallbladder: Gallbladder partially contracted without evidence of gallstones or sonographic Murphy sign. Small amount of internal sludge is present. No wall thickening. Common bile duct: Diameter: 6 mm upper normal Liver: Echogenic hepatic parenchyma with nodular contour consistent with cirrhosis. No discrete hepatic mass. Portal vein is patent on color Doppler imaging with normal direction of blood flow towards the liver. IVC: Normal appearance Pancreas: Normal appearance Spleen: 11.9 cm length, calculated volume 399 mL.  No focal mass. Right Kidney: Length: 11.9 cm. Normal morphology without mass or hydronephrosis. Left Kidney: Length: 12.0 cm. Normal morphology without mass or hydronephrosis. Abdominal aorta: Normal caliber Other findings: Scattered ascites. ULTRASOUND HEPATIC ELASTOGRAPHY Device: Siemens Helix VTQ Patient position: Not recorded Transducer 5C1 Number of measurements: 10 Hepatic segment:  8 Median kPa: 95.8 IQR: 11.6 IQR/Median kPa ratio: 0.1 Data quality:  Good Diagnostic category: > or =17 kPa: highly suggestive of cACLD with an increased probability of clinically significant portal hypertension The use of hepatic elastography is applicable to patients with viral hepatitis and non-alcoholic fatty liver disease. At this time, there is insufficient data for the referenced cut-off values and use in other causes of liver disease, including alcoholic liver disease. Patients, however, may be  assessed by elastography and serve as their own reference standard/baseline. In patients with non-alcoholic liver disease, the values suggesting compensated advanced chronic liver disease (cACLD) may be lower, and patients may need additional testing with elasticity results of 7-9 kPa. Please note that abnormal hepatic elasticity and shear wave velocities may also be identified in clinical settings other than with hepatic fibrosis, such as: acute hepatitis, elevated right heart and central venous pressures including use of beta blockers, veno-occlusive disease (Budd-Chiari), infiltrative processes such as mastocytosis/amyloidosis/infiltrative tumor/lymphoma, extrahepatic cholestasis, with hyperemia in the post-prandial state, and with liver transplantation. Correlation with patient history, laboratory data, and clinical condition recommended. Diagnostic Categories: < or =5 kPa: high probability of being normal < or =9 kPa: in the absence of other known clinical signs, rules out cACLD >9 kPa and ?13 kPa: suggestive of cACLD, but needs further testing >13 kPa: highly suggestive of cACLD > or =17 kPa: highly suggestive of cACLD with an increased probability of clinically significant portal hypertension IMPRESSION: ULTRASOUND ABDOMEN: Cirrhotic appearing liver without focal mass. ULTRASOUND HEPATIC ELASTOGRAPHY: Median kPa:  95.8 Diagnostic category: > or =17 kPa: highly suggestive of cACLD with an increased probability of clinically significant portal hypertension Electronically Signed   By: Lavonia Dana M.D.   On: 05/03/2020 17:09   ECHOCARDIOGRAM COMPLETE  Result Date: 05/15/2020    ECHOCARDIOGRAM REPORT   Patient Name:   SEDRA KINSMAN Date of Exam: 05/15/2020 Medical Rec #:  [id number]       Height:       60.0 in Accession #:    [id number]      Weight:       127.9 lb Date of Birth:  [birth year]        BSA:          1.543 m Patient Age:    53 years        BP:           88/64 mmHg Patient Gender: F               HR:            95 bpm. Exam Location:  [facility] Procedure: 2D Echo, Cardiac Doppler and Color Doppler Indications:  Bacteremia R78.81  History:        Patient has prior history of Echocardiogram examinations, most                 recent 02/23/2020. Risk Factors:Dyslipidemia. Cardiac Arrest,                 Elevated Liver Function Test.  Sonographer:    Alvino Chapel RCS Referring Phys: [id number] [place]  1. Left ventricular ejection fraction, by estimation, is 60 to 65%. The left ventricle has normal function. The left ventricle has no regional wall motion abnormalities. Left ventricular diastolic parameters are indeterminate.  2. Right ventricular systolic function is normal. The right ventricular size is normal.  3. Left atrial size was moderately dilated.  4. The mitral valve is normal in structure. No evidence of mitral valve regurgitation. No evidence of mitral stenosis.  5. The aortic valve has an indeterminant number of cusps. Aortic valve regurgitation is not visualized. No aortic stenosis is present.  6. The inferior vena cava is normal in size with greater than 50% respiratory variability, suggesting right atrial pressure of 3 mmHg. FINDINGS  Left Ventricle: Left ventricular ejection fraction, by estimation, is 60 to 65%. The left ventricle has normal function. The left ventricle has no regional wall motion abnormalities. The left ventricular internal cavity size was normal in size. There is  no left ventricular hypertrophy. Left ventricular diastolic parameters are indeterminate. Right Ventricle: The right ventricular size is normal. No increase in right ventricular wall thickness. Right ventricular systolic function is normal. Left Atrium: Left atrial size was moderately dilated. Right Atrium: Right atrial size was not well visualized. Pericardium: There is no evidence of pericardial effusion. Mitral Valve: The mitral valve is normal in structure. No evidence of mitral valve  regurgitation. No evidence of mitral valve stenosis. Tricuspid Valve: The tricuspid valve is normal in structure. Tricuspid valve regurgitation is not demonstrated. No evidence of tricuspid stenosis. Aortic Valve: The aortic valve has an indeterminant number of cusps. Aortic valve regurgitation is not visualized. No aortic stenosis is present. Aortic valve mean gradient measures 5.1 mmHg. Aortic valve peak gradient measures 8.2 mmHg. Aortic valve area, by VTI measures 2.13 cm. Pulmonic Valve: The pulmonic valve was not well visualized. Pulmonic valve regurgitation is not visualized. No evidence of pulmonic stenosis. Aorta: The aortic root is normal in size and structure. Pulmonary Artery: Inadequate TR jet, indeterminant PASP. Venous: The inferior vena cava is normal in size with greater than 50% respiratory variability, suggesting right atrial pressure of 3 mmHg. IAS/Shunts: No atrial level shunt detected by color flow Doppler.  LEFT VENTRICLE PLAX 2D LVIDd:         4.80 cm  Diastology LVIDs:         3.10 cm  LV e' medial:    10.60 cm/s LV PW:         1.00 cm  LV E/e' medial:  11.3 LV IVS:        1.00 cm  LV e' lateral:   9.14 cm/s LVOT diam:     1.80 cm  LV E/e' lateral: 13.1 LV SV:         64 LV SV Index:   41 LVOT Area:     2.54 cm  RIGHT VENTRICLE RV S prime:     16.30 cm/s TAPSE (M-mode): 2.6 cm LEFT ATRIUM             Index       RIGHT  ATRIUM           Index LA diam:        4.20 cm 2.72 cm/m  RA Area:     14.20 cm LA Vol (A2C):   53.8 ml 34.86 ml/m RA Volume:   36.10 ml  23.39 ml/m LA Vol (A4C):   64.2 ml 41.60 ml/m LA Biplane Vol: 62.5 ml 40.49 ml/m  AORTIC VALVE AV Area (Vmax):    2.40 cm AV Area (Vmean):   2.00 cm AV Area (VTI):     2.13 cm AV Vmax:           143.34 cm/s AV Vmean:          109.425 cm/s AV VTI:            0.301 m AV Peak Grad:      8.2 mmHg AV Mean Grad:      5.1 mmHg LVOT Vmax:         135.00 cm/s LVOT Vmean:        86.200 cm/s LVOT VTI:          0.251 m LVOT/AV VTI ratio: 0.84   AORTA Ao Root diam: 3.00 cm MITRAL VALVE MV Area (PHT): 3.97 cm     SHUNTS MV Decel Time: 191 msec     Systemic VTI:  0.25 m MV E velocity: 120.00 cm/s  Systemic Diam: 1.80 cm MV A velocity: 87.30 cm/s MV E/A ratio:  1.37 Carlyle Dolly MD Electronically signed by Carlyle Dolly MD Signature Date/Time: 05/15/2020/2:16:14 PM    Final    ECHO TEE  Result Date: 05/16/2020    TRANSESOPHOGEAL ECHO REPORT   Patient Name:   [name] Date of Exam: 05/16/2020 Medical Rec #:  [id number]       Height:       60.0 in Accession #:    [id number]      Weight:       127.9 lb Date of Birth:  01-07-1966        BSA:          1.543 m Patient Age:    45 years        BP:           123/85 mmHg Patient Gender: F               HR:           101 bpm. Exam Location:  [facility] Procedure: Transesophageal Echo, Cardiac Doppler and Color Doppler Indications:    Bacteremia  History:        Patient has prior history of Echocardiogram examinations, most                 recent 05/15/2020. Risk Factors:Dyslipidemia. Cardiac Arrest,                 Elevated Liver Function Test.  Sonographer:    Alvino Chapel RCS Referring Phys: [id number] [place]: The transesophogeal probe was passed without difficulty through the esophogus of the patient. Sedation performed by different physician. The patient developed no complications during the procedure. IMPRESSIONS  1. Left ventricular ejection fraction, by estimation, is 60 to 65%. The left ventricle has normal function.  2. Right ventricular systolic function is normal. The right ventricular size is normal.  3. Left atrial size was mildly dilated. No left atrial/left atrial appendage thrombus was detected. The LAA emptying velocity was 85 cm/s.  4. Right atrial size was mildly dilated.  5. The mitral valve is normal in structure. Trivial mitral valve regurgitation. No evidence of mitral stenosis.  6. The aortic valve is tricuspid. Aortic valve regurgitation is not visualized. No aortic  stenosis is present. Conclusion(s)/Recommendation(s): No evidence of vegetation/infective endocarditis on this transesophageal echocardiogram. FINDINGS  Left Ventricle: Left ventricular ejection fraction, by estimation, is 60 to 65%. The left ventricle has normal function. The left ventricular internal cavity size was normal in size. Right Ventricle: The right ventricular size is normal. No increase in right ventricular wall thickness. Right ventricular systolic function is normal. Left Atrium: Left atrial size was mildly dilated. No left atrial/left atrial appendage thrombus was detected. The LAA emptying velocity was 85 cm/s. Right Atrium: Right atrial size was mildly dilated. Pericardium: There is no evidence of pericardial effusion. Mitral Valve: The mitral valve is normal in structure. Trivial mitral valve regurgitation. No evidence of mitral valve stenosis. Tricuspid Valve: The tricuspid valve is normal in structure. Tricuspid valve regurgitation is trivial. No evidence of tricuspid stenosis. Aortic Valve: The aortic valve is tricuspid. Aortic valve regurgitation is not visualized. No aortic stenosis is present. Pulmonic Valve: The pulmonic valve was normal in structure. Pulmonic valve regurgitation is not visualized. No evidence of pulmonic stenosis. Aorta: The aortic root is normal in size and structure. IAS/Shunts: No atrial level shunt detected by color flow Doppler. Carlyle Dolly MD Electronically signed by Carlyle Dolly MD Signature Date/Time: 05/16/2020/1:28:24 PM    Final    [name] ASCITES (ABDOMEN LIMITED)  Result Date: 05/14/2020 CLINICAL DATA:  Ascites, question sufficient for paracentesis, clinical concern for spontaneous bacterial peritonitis EXAM: LIMITED ABDOMEN ULTRASOUND FOR ASCITES TECHNIQUE: Limited ultrasound survey for ascites was performed in all four abdominal quadrants. COMPARISON:  CT abdomen and pelvis 05/13/2020 FINDINGS: Small volume ascites identified in the lower quadrants  bilaterally. Volume of ascites has probably slightly increased since prior CT. While the volume of fluid is insufficient for therapeutic paracentesis, a small volume of ascites may be obtainable for diagnostic purposes. Diagnostic paracentesis will be attempted. IMPRESSION: Small volume ascites, likely sufficient volume for diagnostic paracentesis. Electronically Signed   By: Lavonia Dana M.D.   On: 05/14/2020 11:11   [name] EKG SITE RITE  Result Date: 05/17/2020 If Site Rite image not attached, placement could not be confirmed due to current cardiac rhythm.   Orson Eva, DO  [facility]  If 7PM-7AM, please contact night-coverage [email or web address] Password [email or web address] 05/21/2020, 2:43 PM   LOS: 8 days

## 2020-05-21 NOTE — Progress Notes (Signed)
Physical Therapy Treatment Patient Details Name: [name] MRN: [id number] DOB: 1965/03/02 Today's Date: 05/21/2020    History of Present Illness [name]  is a 55 y.o. female, with history of seizures, mood disorder, benign brain tumor, fatty liver, hypercholesterol, and more presents the ED with a chief complaint of fall.  Patient reports that she did prior to presentation.  She reports that her house is being remodeled and she tripped on something and fell forward hit her head on the railing and landed on her right chest.  She reports that she hit the back of her head on the way down.  She had no loss of consciousness.  She has had no change in vision or hearing.  She reports that prior to the fall she did have dizziness, chest pain, nausea, but these are chronic daily symptoms for her.  She did not have palpitations.  Her main complaint at this time is that the right side of her chest hurts.  It is worse with deep breaths and with position changes.  She tried Tylenol at home with no relief.  She does admit to having fever prior to coming to the ED today.  She reports a T-max at home of 103.  At presentation her temperature was 101.  She denies cough.  She admits to urinary frequency but no dysuria or urgency.  She has been constipated but did take a laxative yesterday with adequate response.  She reports she had a UTI a month ago and had antibiotics for it and the symptoms cleared up.  She has no rashes or open sores on her body.  She reports nausea and vomiting x3 today prior to coming in on the ambulance.  There is nonbloody emesis.  She has no other complaints at this time.    PT Comments    Patient presents restless, agreeable for therapy and motivated to get out of bed.  Patient demonstrates slow labored stiff movement of BUE/LE and requesting IV pain medication, required verbal/tactile cueing to relax muscles, then able to sit up at bedside, very unsteady on feet with difficulty  advancing BLE due to stiffness, once seated in chair able to relax muscles and completed BLE ROM/strenghtening exercises.  Patient tolerated staying up in chair after therapy - RN aware.  Patient will benefit from continued physical therapy in hospital and recommended venue below to increase strength, balance, endurance for safe ADLs and gait.   Follow Up Recommendations  SNF     Equipment Recommendations       Recommendations for Other Services       Precautions / Restrictions Precautions Precautions: Fall Restrictions Weight Bearing Restrictions: No    Mobility  Bed Mobility Overal bed mobility: Needs Assistance Bed Mobility: Supine to Sit     Supine to sit: Mod assist;Max assist     General bed mobility comments: increased time, labored movement, very stiff when moving BLE    Transfers Overall transfer level: Needs assistance Equipment used: Rolling walker (2 wheeled) Transfers: Sit to/from [facility] Sit to Stand: Mod assist;Max assist Stand pivot transfers: Mod assist;Max assist       General transfer comment: very unsteady on feet due to stiffness requiring verbal/tactile cueing for safety  Ambulation/Gait Ambulation/Gait assistance: Mod assist;Max assist Gait Distance (Feet): 4 Feet Assistive device: Rolling walker (2 wheeled) Gait Pattern/deviations: Decreased step length - right;Decreased step length - left;Decreased stride length Gait velocity: decreased   General Gait Details: limited to 3-4 unsteady labored steps  with difficulty advancing BLE due to stiffness   Stairs             Wheelchair Mobility    Modified Rankin (Stroke Patients Only)       Balance Overall balance assessment: Needs assistance Sitting-balance support: Feet supported;No upper extremity supported Sitting balance-Leahy Scale: Poor Sitting balance - Comments: fair/poor seated at EOB   Standing balance support: During functional activity;Bilateral  upper extremity supported Standing balance-Leahy Scale: Poor Standing balance comment: using RW                            Cognition Arousal/Alertness: Awake/alert Behavior During Therapy: WFL for tasks assessed/performed Overall Cognitive Status: Within Functional Limits for tasks assessed                                        Exercises General Exercises - Lower Extremity Long Arc Quad: Seated;AROM;Strengthening;Both;10 reps Hip Flexion/Marching: Seated;AROM;Strengthening;Both;10 reps Toe Raises: Seated;AROM;Strengthening;Both;10 reps Heel Raises: Seated;AROM;Strengthening;Both;10 reps    General Comments        Pertinent Vitals/Pain Pain Assessment: Faces Faces Pain Scale: Hurts a little bit Pain Location: B LE Pain Descriptors / Indicators: Sore Pain Intervention(s): Limited activity within patient's tolerance;Monitored during session;Repositioned    Home Living                      Prior Function            PT Goals (current goals can now be found in the care plan section) Acute Rehab PT Goals Patient Stated Goal: return home after rehab PT Goal Formulation: With patient Time For Goal Achievement: 06/01/20 Potential to Achieve Goals: Good Progress towards PT goals: Progressing toward goals    Frequency    Min 3X/week      PT Plan Current plan remains appropriate    Co-evaluation              AM-PAC PT "6 Clicks" Mobility   Outcome Measure  Help needed turning from your back to your side while in a flat bed without using bedrails?: A Lot Help needed moving from lying on your back to sitting on the side of a flat bed without using bedrails?: A Lot Help needed moving to and from a bed to a chair (including a wheelchair)?: A Lot Help needed standing up from a chair using your arms (e.g., wheelchair or bedside chair)?: A Lot Help needed to walk in hospital room?: A Lot Help needed climbing 3-5 steps with a  railing? : Total 6 Click Score: 11    End of Session   Activity Tolerance: Patient tolerated treatment well;Patient limited by fatigue Patient left: in chair;with call bell/phone within reach;with chair alarm set Nurse Communication: Mobility status PT Visit Diagnosis: Unsteadiness on feet (R26.81);Other abnormalities of gait and mobility (R26.89);Muscle weakness (generalized) (M62.81)     Time: 5726-2035 PT Time Calculation (min) (ACUTE ONLY): 23 min  Charges:  $Therapeutic Exercise: 8-22 mins $Therapeutic Activity: 8-22 mins                     3:11 PM, 05/21/20 Lonell Grandchild, MPT Physical Therapist with [facility] 336 [phone] office [phone] mobile phone

## 2020-05-21 NOTE — Discharge Summary (Incomplete)
Physician Discharge Summary  [name] MCN:[id number] DOB: [birth year] DOA: 05/12/2020  PCP: [name], MD  Admit date: 05/12/2020 Discharge date: 05/22/20  Admitted From: Home Disposition:  SNF  Recommendations for Outpatient Follow-up:  1. Follow up with PCP in 1-2 weeks 2. Please obtain BMP/CBC in one week   Discharge Condition: Stable CODE STATUS: FULL Diet recommendation: Heart Healthy   Brief/Interim Summary: 55 year old female with a history of COPD, meningioma, cardiac arrest December 2020, hepatic steatosis,PNES, cognitive impairment, depression, complex partial seizure presentingwithgeneralized weakness and a fall.This has been a recurrent problem for the patient. She was admitted on 2/7 to 02/24/20 with a similar presentation. At that time she was treated for acute metabolic encephalopathy which was thought to be multifactorial including elevated ammonia, dehydration and dopamine agonist. She was discharged home with HHPT. She follow up with [facility], Dr. 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 She underwent liver elastography on 05/03/20 which suggested cACLD with increased probability of clinically significant portal HTN. She presented on 05/12/20 with another mechanical fall.She reports that her house is being remodeled and she tripped on something and fell forward hit her head on the railing and landed on her right chest. She reports that she hit the back of her head on the way down. She had no loss of consciousness. At presentation her temperature was 101.Blood cultures were done and she was found to have MSSA bacteremia. Also CT of the abd/pelvis on 05/13/20 showed signs of cirrhosis with portal HTN. She also had splenomegaly. THere was also a mildly thickened cecum and ascending colon with diffuse edematous appearance of the sm bowel. She underwent TEE on 05/16/20 after which she remained hypoxic on  NRB. She was then transferred to SDU for further care.  She was started on IV lasix with clinical improvement.   Discharge Diagnoses:  Severe Sepsis -present on admission -presented with fever, tachycardia, tachypnea, elevated lactate -due to MSSA bacteremia -Lactic peaked 2.2>>1.8 -UA 6-10 WBC -4/30 CXR--no consolidation -Random cortisol level 17.1 -sepsis physiology resolved  MSSA Bacteremia -05/12/20 blood culture--positive MSSA -05/14/20 blood cuture--neg to date -continue cefazolin -05/16/20 TEE--no vegetation -PICC line 5/6 if surveillance blood cultures remain neg -plan abx till 5/29  Acute respiratory failure with hypoxia -oxygen saturation 88-90% on NRB in PACU 5/4 -personally reviewed CXR--bilateral interstitial infiltrates -05/16/20 ABG 7.471/32/57/24 (0.8) -continue  lasix 40 mg IV--increase to bid -05/15/20 Echo=EF 60-65%, no WMA, indeterminant diastolic -now down to 6.2H [place]>>RA -discharge on lasix 40 mg po bid  Chronic pain -d/c dilaudid due to drowsiness -5/8 at 0120--started on fentanyl due to n/v -5/8 afternoon--remains drowsy -5/8 AM RN reports patient tolerating diet without n/v -d/c fentanyl -restarted oxycodone>>cut to 1/2 dose  NAFLD/Liver Cirrhosis -05/13/20 CT abd as discussed above -05/14/20 paracentesis--WBC 51, only 20 cc removed  Colonic Wall Thickening -?portal HTN colopathy -no abd pain, no diarrhea; tolerating diet  Gait instability/frequent falls/dizziness -PT evaluation-->SNF -CT brain negativeacute findings -No focal deficits on exam -5/3/22echo--EF 60-65%, no WMA  Complex partial seizure/PNES -Continue home dose of Keppra and Lamictal  Depression -Continue fluoxetine 80 mg daily -Continue as needed lorazepam  Meningioma -Patient follows Dr. [name] -s/p fractionated radiosurgery Sept 2020--Dr. [name] -01/18/20 MR brain--mass is stable in size as compared to the brain MRI of 09/13/2019 -no focal deficits on  exam  Hypokalemia -replete  Transaminasemia -stable -due to NAFLD and hepatic congestion -05/14/20 RUQ US--Nodular contour to the liver suggesting cirrhosis.  Mild  ascites.  Mild sludge in otherwise normal appearing gallbladder. A small amount of fluid adjacent to the gallbladder is likely due to the ascites.  Restless Leg -previously discontinued mirapex due to dopamine agonist causing confusion -confusion improved -remain off mirapex    Discharge Instructions  Discharge Instructions    [facility] pharmacist to adjust dose for Vancomycin, Aminoglycosides and other anti-infective therapies as requested by physician.   Complete by: As directed    [facility] to provide Cath Flo 19m   Complete by: As directed    Administer for PICC line occlusion and as ordered by physician for other access device issues.   Anaphylaxis Kit: Provided to treat any anaphylactic reaction to the medication being provided to the patient if First Dose or when requested by physician   Complete by: As directed    Epinephrine 145mml vial / amp: Administer 0.62m462m0.62ml59mubcutaneously once for moderate to severe anaphylaxis, nurse to call physician and pharmacy when reaction occurs and call 911 if needed for immediate care   Diphenhydramine 50mg78mIV vial: Administer 25-50mg 14mM PRN for first dose reaction, rash, itching, mild reaction, nurse to call physician and pharmacy when reaction occurs   Sodium Chloride 0.9% NS 500ml I47mdminister if needed for hypovolemic blood pressure drop or as ordered by physician after call to physician with anaphylactic reaction   Change dressing on IV access line weekly and PRN   Complete by: As directed    Flush IV access with Sodium Chloride 0.9% and Heparin 10 units/ml or 100 units/ml   Complete by: As directed    Home infusion instructions - [facility]   Complete by: As directed    Instructions: Flush IV access with Sodium Chloride  0.9% and Heparin 10units/ml or 100units/ml   Change dressing on IV access line: Weekly and PRN   Instructions Cath Flo 2mg: Ad56mister for PICC Line occlusion and as ordered by physician for other access device   [facility] pharmacist to adjust dose for: Vancomycin, Aminoglycosides and other anti-infective therapies as requested by physician   Method of administration may be changed at the discretion of home infusion pharmacist based upon assessment of the patient and/or caregiver's ability to self-administer the medication ordered   Complete by: As directed      Allergies as of 05/21/2020      Reactions   Acetaminophen Other (See Comments)   Has fatty deposits on liver   Benadryl [diphenhydramine Hcl] Other (See Comments)   hyperactivity and seizures   Codeine Itching, Rash   seizures   Dilantin [phenytoin Sodium Extended] Other (See Comments)   Elevated LFT's   Melatonin Other (See Comments)   seizures   Tramadol Other (See Comments)   "causes seizures"   Ultram [tramadol Hcl] Other (See Comments)   Seizures   Vimpat [lacosamide] Other (See Comments)   Severe dizziness   Mirtazapine Other (See Comments)   Potentiated seizure activity   Betadine [povidone Iodine] Rash   Latex Rash   Penicillins Itching, Rash   Patient can tolerate cephalosporins   Sulfa Antibiotics Rash   Tape Rash, Other (See Comments)   Paper tape please Paper tape please   Vicodin [hydrocodone-acetaminophen] Itching    Med Rec must be completed prior to using [facility]   Discharge Care Instructions  (From admission, onward)         Start     Ordered   05/17/20 0000  Change dressing  on IV access line weekly and PRN  (Home infusion instructions - [facility] )        05/17/20 1543          Contact information for follow-up providers    [facility] Follow up.   Why: Will call to set up first home visit.           Contact information for  after-discharge care    [facility] .   Service: Skilled Nursing Contact information: [place] [phone]                 Allergies  Allergen Reactions  . Acetaminophen Other (See Comments)    Has fatty deposits on liver  . Benadryl [Diphenhydramine Hcl] Other (See Comments)    hyperactivity and seizures  . Codeine Itching and Rash    seizures  . Dilantin [Phenytoin Sodium Extended] Other (See Comments)    Elevated LFT's  . Melatonin Other (See Comments)    seizures  . Tramadol Other (See Comments)    "causes seizures"  . Ultram [Tramadol Hcl] Other (See Comments)    Seizures  . Vimpat [Lacosamide] Other (See Comments)    Severe dizziness  . Mirtazapine Other (See Comments)    Potentiated seizure activity  . Betadine [Povidone Iodine] Rash  . Latex Rash  . Penicillins Itching and Rash    Patient can tolerate cephalosporins  . Sulfa Antibiotics Rash  . Tape Rash and Other (See Comments)    Paper tape please Paper tape please  . Vicodin [Hydrocodone-Acetaminophen] Itching    Consultations:  ***   Procedures/Studies: CT HEAD WO CONTRAST  Result Date: 05/13/2020 CLINICAL DATA:  Fall 2 days prior EXAM: CT HEAD WITHOUT CONTRAST TECHNIQUE: Contiguous axial images were obtained from the base of the skull through the vertex without intravenous contrast. COMPARISON:  CT 02/20/2020 FINDINGS: Brain: No evidence of acute infarction, hemorrhage, hydrocephalus, extra-axial collection, visible mass lesion or mass effect. Vascular: Atherosclerotic calcification of the carotid siphons. No hyperdense vessel. Skull: No calvarial fracture or suspicious osseous lesion. No scalp swelling or hematoma. Sinuses/Orbits: Paranasal sinuses are predominantly clear. Mastoid air cells appear chronically hypo pneumatized. Middle ear cavities are clear. Debris in the bilateral external auditory canals.  Included orbital structures are unremarkable. Other: Asymmetric thickening and calcification in the left parotid gland is a chronic, nonspecific finding, similar to comparison study. IMPRESSION: No acute intracranial abnormality. No calvarial fracture, significant scalp swelling or hematoma. Intracranial atherosclerosis. Asymmetric thickening and calcification in the left parotid gland, a nonspecific, chronic finding unchanged from prior. Can reflect sequela of a chronic inflammatory process. Debris in the external auditory canals, correlate for cerumen impaction. Electronically Signed   By: Lovena Le M.D.   On: 05/13/2020 00:44   CT ABDOMEN PELVIS W CONTRAST  Result Date: 05/13/2020 CLINICAL DATA:  Abdominal pain, fever, right upper quadrant pain EXAM: CT ABDOMEN AND PELVIS WITH CONTRAST TECHNIQUE: Multidetector CT imaging of the abdomen and pelvis was performed using the standard protocol following bolus administration of intravenous contrast. CONTRAST:  113m OMNIPAQUE IOHEXOL 300 MG/ML  SOLN COMPARISON:  CT 02/12/2020 FINDINGS: Lower chest: Basilar atelectatic changes in the lungs including more bandlike areas of subsegmental atelectasis or scarring. Normal heart size. No pericardial effusion. Hepatobiliary: Heterogeneous hepatic attenuation with a nodular liver surface contour concerning for intrinsic liver disease/cirrhosis. No concerning focal liver lesion. Moderate gallbladder distension. No significant gallbladder wall thickening or  pericholecystic inflammation is seen. No visible calcified gallstones or biliary ductal dilatation. Pancreas: Slightly edematous appearance of the pancreatic parenchyma with very faint peripancreatic haze, uniform enhancement. No pancreatic ductal dilatation. Spleen: Mild splenomegaly.  No concerning focal splenic lesion. Adrenals/Urinary Tract: Normal adrenals. Kidneys enhance symmetrically. Symmetric and uniform excretion. No concerning focal renal lesion. Bilateral  renal cortical scarring. Nonobstructing calculi present in both kidneys. No obstructive urolithiasis or hydronephrosis is seen at this time. Bladder is unremarkable for the degree of distention. Stomach/Bowel: Paraesophageal venous collaterals and varices. Some minimal collateralization and varices about the gastric fundus as well. Thickening towards the gastric antrum likely reflecting normal peristalsis. Air and fluid-filled appearance of the duodenum with a normal sweep across the midline abdomen. Slightly edematous appearance diffusely through the small bowel is nonspecific given additional findings above. Moderate right-sided colonic stool burden, particularly within the cecum. Some mild circumferential thickening and mucosal hyperemia about the cecum and ascending colon is nonspecific given hepatic features. No evidence of mechanical obstruction. Appendix is not well visualized. Vascular/Lymphatic: Atherosclerotic calcifications within the abdominal aorta and branch vessels. No aneurysm or ectasia. Paraesophageal and gastric varices with venous collaterals, as above. Splenorenal collateralization. Upper abdominal varices.No enlarged abdominopelvic lymph nodes. Few scattered calcified lymph nodes are present in the abdomen and pelvis. Reproductive: Portion the pelvis obscured by streak artifact. 2.6 cm cystic focus in the left adnexa, unchanged from comparison. No concerning right adnexal lesion. Prior hysterectomy. Other: Increasing edematous changes of the central mesentery. Small volume low-attenuation fluid in the deep pelvis as well as trace perihepatic ascites. Circumferential body wall edema. No bowel containing hernia. No free abdominopelvic air. Musculoskeletal: Multilevel degenerative changes are present in the imaged portions of the spine. Grade 1 anterolisthesis L4 on L5 without spondylolysis, unchanged from prior. Prior left hip arthroplasty with chronic bony remodeling and a similar appearance  overall to comparison prior. IMPRESSION: Constellation of features suggestive of intrinsic liver disease/cirrhosis with features of portal hypertension including a nodular, heterogeneous liver with paraesophageal and gastric varices, additional upper abdominal venous collateralization, splenorenal shunting and splenomegaly. Interval development of a small volume ascites as well as additional features of at least mild or early developing anasarca with circumferential body wall edema and central mesenteric edema. Some hazy peripancreatic stranding may be present, possibly related to a diffusely edematous appearance of the mesentery though could correlate with lipase. Mildly thickened appearance of the cecum and ascending colon as well as a diffusely edematous appearance of the small bowel is nonspecific in the setting of suspected intrinsic liver disease. Could reflect portal enteropathy/colopathy though should exclude clinical symptoms of enterocolitis. Bilateral nonobstructing nephrolithiasis. No obstructive urolithiasis or hydronephrosis at this time. Bilateral renal cortical scarring. Prior hysterectomy. Stable 2.7 cm left ovarian cyst. Continued stability favoring a benign process. No follow-up imaging recommended. Note: This recommendation does not apply to premenarchal patients and to those with increased risk (genetic, family history, elevated tumor markers or other high-risk factors) of ovarian cancer. Reference: JACR 2020 Feb; 17(2):248-254 Aortic Atherosclerosis (ICD10-I70.0). Electronically Signed   By: Lovena Le M.D.   On: 05/13/2020 00:35   US Abdomen Limited  Result Date: 05/13/2020 CLINICAL DATA:  Right upper quadrant pain. EXAM: ULTRASOUND ABDOMEN LIMITED RIGHT UPPER QUADRANT COMPARISON:  February 23, 2020 ultrasound.  CT scan May 12, 2020. FINDINGS: Gallbladder: A small amount of sludge is seen in the gallbladder. No stones, Murphy's sign, or wall thickening. A small amount of ascites is  identified, including adjacent to the gallbladder. Common bile duct:  Diameter: 5 mm Liver: There is a nodular contour in the liver. No focal mass. Portal vein is patent on color Doppler imaging with normal direction of blood flow towards the liver. Other: Mild ascites. IMPRESSION: 1. Nodular contour to the liver suggesting cirrhosis.  Mild ascites. 2. Mild sludge in otherwise normal appearing gallbladder. A small amount of fluid adjacent to the gallbladder is likely due to the ascites. Electronically Signed   By: Dorise Bullion III M.D   On: 05/13/2020 09:06   US Paracentesis  Result Date: 05/14/2020 INDICATION: Fever, abdominal pain, question spontaneous bacterial peritonitis, ascites on CT EXAM: ULTRASOUND GUIDED DIAGNOSTIC PARACENTESIS MEDICATIONS: None COMPLICATIONS: None immediate PROCEDURE: Informed written consent was obtained from the patient after a discussion of the risks, benefits and alternatives to treatment. A timeout was performed prior to the initiation of the procedure. Initial ultrasound scanning demonstrates a small amount of ascites within the right lower abdominal quadrant. The right lower abdomen was prepped and draped in the usual sterile fashion. 1% lidocaine was used for local anesthesia. Following this, a 5 [place] Yueh catheter was introduced. An ultrasound image was saved for documentation purposes. The paracentesis was performed. The catheter was removed and a dressing was applied. The patient tolerated the procedure well without immediate post procedural complication. FINDINGS: A total of approximately 20 mL of clear yellow ascitic fluid was removed. Samples were sent to the laboratory as requested by the clinical team. IMPRESSION: Successful ultrasound-guided paracentesis yielding 20 mL of peritoneal fluid for diagnostic testing. Electronically Signed   By: Lavonia Dana M.D.   On: 05/14/2020 12:39   DG CHEST PORT 1 VIEW  Result Date: 05/20/2020 CLINICAL DATA:  Pulmonary edema  EXAM: PORTABLE CHEST 1 VIEW COMPARISON:  05/16/2020 chest radiograph. FINDINGS: Right PICC terminates over the right atrium. Stable cardiomediastinal silhouette with normal heart size. No pneumothorax. No pleural effusion. Patchy hazy opacities throughout both lungs, significantly improved. IMPRESSION: Significantly improved patchy hazy opacities throughout both lungs, compatible with improving pulmonary edema. Electronically Signed   By: Ilona Sorrel M.D.   On: 05/20/2020 07:32   DG CHEST PORT 1 VIEW  Result Date: 05/16/2020 CLINICAL DATA:  Shortness of breath. EXAM: PORTABLE CHEST 1 VIEW COMPARISON:  Chest x-ray dated May 12, 2020. FINDINGS: The heart size and mediastinal contours are within normal limits. Prominent diffuse interstitial thickening with bilateral perihilar opacities. Small bilateral pleural effusions. No pneumothorax. No acute osseous abnormality. IMPRESSION: 1. Moderate to severe pulmonary edema with small bilateral pleural effusions. Electronically Signed   By: Titus Dubin M.D.   On: 05/16/2020 12:57   DG Chest Portable 1 View  Result Date: 05/12/2020 CLINICAL DATA:  Right sided rib pain. EXAM: PORTABLE CHEST 1 VIEW COMPARISON:  February 12, 2020 FINDINGS: A very mild amount of linear atelectasis is seen within the right lung base. There is no evidence of acute infiltrate, pleural effusion or pneumothorax. The heart size and mediastinal contours are within normal limits. A chronic seventh right rib fracture is seen. IMPRESSION: 1. Very mild right basilar linear atelectasis. 2. Chronic seventh right rib fracture. Electronically Signed   By: Virgina Norfolk M.D.   On: 05/12/2020 23:16   US ABDOMEN COMPLETE W/ELASTOGRAPHY  Result Date: 05/03/2020 CLINICAL DATA:  Fatty liver EXAM: ULTRASOUND ABDOMEN ULTRASOUND HEPATIC ELASTOGRAPHY TECHNIQUE: Sonography of the upper abdomen was performed. In addition, ultrasound elastography evaluation of the liver was performed. A region of interest  was placed within the right lobe of the liver. Following application of a compressive  sonographic pulse, tissue compressibility was assessed. Multiple assessments were performed at the selected site. Median tissue compressibility was determined. Previously, hepatic stiffness was assessed by shear wave velocity. Based on recently published Society of Radiologists in Ultrasound consensus article, reporting is now recommended to be performed in the SI units of pressure (kiloPascals) representing hepatic stiffness/elasticity. The obtained result is compared to the published reference standards. (cACLD = compensated Advanced Chronic Liver Disease) COMPARISON:  02/23/2020 FINDINGS: ULTRASOUND ABDOMEN Gallbladder: Gallbladder partially contracted without evidence of gallstones or sonographic Murphy sign. Small amount of internal sludge is present. No wall thickening. Common bile duct: Diameter: 6 mm upper normal Liver: Echogenic hepatic parenchyma with nodular contour consistent with cirrhosis. No discrete hepatic mass. Portal vein is patent on color Doppler imaging with normal direction of blood flow towards the liver. IVC: Normal appearance Pancreas: Normal appearance Spleen: 11.9 cm length, calculated volume 399 mL.  No focal mass. Right Kidney: Length: 11.9 cm. Normal morphology without mass or hydronephrosis. Left Kidney: Length: 12.0 cm. Normal morphology without mass or hydronephrosis. Abdominal aorta: Normal caliber Other findings: Scattered ascites. ULTRASOUND HEPATIC ELASTOGRAPHY Device: Siemens Helix VTQ Patient position: Not recorded Transducer 5C1 Number of measurements: 10 Hepatic segment:  8 Median kPa: 95.8 IQR: 11.6 IQR/Median kPa ratio: 0.1 Data quality:  Good Diagnostic category: > or =17 kPa: highly suggestive of cACLD with an increased probability of clinically significant portal hypertension The use of hepatic elastography is applicable to patients with viral hepatitis and non-alcoholic fatty liver  disease. At this time, there is insufficient data for the referenced cut-off values and use in other causes of liver disease, including alcoholic liver disease. Patients, however, may be assessed by elastography and serve as their own reference standard/baseline. In patients with non-alcoholic liver disease, the values suggesting compensated advanced chronic liver disease (cACLD) may be lower, and patients may need additional testing with elasticity results of 7-9 kPa. Please note that abnormal hepatic elasticity and shear wave velocities may also be identified in clinical settings other than with hepatic fibrosis, such as: acute hepatitis, elevated right heart and central venous pressures including use of beta blockers, veno-occlusive disease (Budd-Chiari), infiltrative processes such as mastocytosis/amyloidosis/infiltrative tumor/lymphoma, extrahepatic cholestasis, with hyperemia in the post-prandial state, and with liver transplantation. Correlation with patient history, laboratory data, and clinical condition recommended. Diagnostic Categories: < or =5 kPa: high probability of being normal < or =9 kPa: in the absence of other known clinical signs, rules out cACLD >9 kPa and ?13 kPa: suggestive of cACLD, but needs further testing >13 kPa: highly suggestive of cACLD > or =17 kPa: highly suggestive of cACLD with an increased probability of clinically significant portal hypertension IMPRESSION: ULTRASOUND ABDOMEN: Cirrhotic appearing liver without focal mass. ULTRASOUND HEPATIC ELASTOGRAPHY: Median kPa:  95.8 Diagnostic category: > or =17 kPa: highly suggestive of cACLD with an increased probability of clinically significant portal hypertension Electronically Signed   By: Lavonia Dana M.D.   On: 05/03/2020 17:09   ECHOCARDIOGRAM COMPLETE  Result Date: 05/15/2020    ECHOCARDIOGRAM REPORT   Patient Name:   [name] Date of Exam: 05/15/2020 Medical Rec #:  [id number]       Height:       60.0 in Accession #:     [id number]      Weight:       127.9 lb Date of Birth:  09-Jul-1965        BSA:          1.543 m Patient Age:  54 years        BP:           88/64 mmHg Patient Gender: F               HR:           95 bpm. Exam Location:  [facility] Procedure: 2D Echo, Cardiac Doppler and Color Doppler Indications:    Bacteremia R78.81  History:        Patient has prior history of Echocardiogram examinations, most                 recent 02/23/2020. Risk Factors:Dyslipidemia. Cardiac Arrest,                 Elevated Liver Function Test.  Sonographer:    Alvino Chapel RCS Referring Phys: [id number] [place]  1. Left ventricular ejection fraction, by estimation, is 60 to 65%. The left ventricle has normal function. The left ventricle has no regional wall motion abnormalities. Left ventricular diastolic parameters are indeterminate.  2. Right ventricular systolic function is normal. The right ventricular size is normal.  3. Left atrial size was moderately dilated.  4. The mitral valve is normal in structure. No evidence of mitral valve regurgitation. No evidence of mitral stenosis.  5. The aortic valve has an indeterminant number of cusps. Aortic valve regurgitation is not visualized. No aortic stenosis is present.  6. The inferior vena cava is normal in size with greater than 50% respiratory variability, suggesting right atrial pressure of 3 mmHg. FINDINGS  Left Ventricle: Left ventricular ejection fraction, by estimation, is 60 to 65%. The left ventricle has normal function. The left ventricle has no regional wall motion abnormalities. The left ventricular internal cavity size was normal in size. There is  no left ventricular hypertrophy. Left ventricular diastolic parameters are indeterminate. Right Ventricle: The right ventricular size is normal. No increase in right ventricular wall thickness. Right ventricular systolic function is normal. Left Atrium: Left atrial size was moderately dilated. Right Atrium: Right  atrial size was not well visualized. Pericardium: There is no evidence of pericardial effusion. Mitral Valve: The mitral valve is normal in structure. No evidence of mitral valve regurgitation. No evidence of mitral valve stenosis. Tricuspid Valve: The tricuspid valve is normal in structure. Tricuspid valve regurgitation is not demonstrated. No evidence of tricuspid stenosis. Aortic Valve: The aortic valve has an indeterminant number of cusps. Aortic valve regurgitation is not visualized. No aortic stenosis is present. Aortic valve mean gradient measures 5.1 mmHg. Aortic valve peak gradient measures 8.2 mmHg. Aortic valve area, by VTI measures 2.13 cm. Pulmonic Valve: The pulmonic valve was not well visualized. Pulmonic valve regurgitation is not visualized. No evidence of pulmonic stenosis. Aorta: The aortic root is normal in size and structure. Pulmonary Artery: Inadequate TR jet, indeterminant PASP. Venous: The inferior vena cava is normal in size with greater than 50% respiratory variability, suggesting right atrial pressure of 3 mmHg. IAS/Shunts: No atrial level shunt detected by color flow Doppler.  LEFT VENTRICLE PLAX 2D LVIDd:         4.80 cm  Diastology LVIDs:         3.10 cm  LV e' medial:    10.60 cm/s LV PW:         1.00 cm  LV E/e' medial:  11.3 LV IVS:        1.00 cm  LV e' lateral:   9.14 cm/s LVOT diam:     1.80  cm  LV E/e' lateral: 13.1 LV SV:         64 LV SV Index:   41 LVOT Area:     2.54 cm  RIGHT VENTRICLE RV S prime:     16.30 cm/s TAPSE (M-mode): 2.6 cm LEFT ATRIUM             Index       RIGHT ATRIUM           Index LA diam:        4.20 cm 2.72 cm/m  RA Area:     14.20 cm LA Vol (A2C):   53.8 ml 34.86 ml/m RA Volume:   36.10 ml  23.39 ml/m LA Vol (A4C):   64.2 ml 41.60 ml/m LA Biplane Vol: 62.5 ml 40.49 ml/m  AORTIC VALVE AV Area (Vmax):    2.40 cm AV Area (Vmean):   2.00 cm AV Area (VTI):     2.13 cm AV Vmax:           143.34 cm/s AV Vmean:          109.425 cm/s AV VTI:             0.301 m AV Peak Grad:      8.2 mmHg AV Mean Grad:      5.1 mmHg LVOT Vmax:         135.00 cm/s LVOT Vmean:        86.200 cm/s LVOT VTI:          0.251 m LVOT/AV VTI ratio: 0.84  AORTA Ao Root diam: 3.00 cm MITRAL VALVE MV Area (PHT): 3.97 cm     SHUNTS MV Decel Time: 191 msec     Systemic VTI:  0.25 m MV E velocity: 120.00 cm/s  Systemic Diam: 1.80 cm MV A velocity: 87.30 cm/s MV E/A ratio:  1.37 Carlyle Dolly MD Electronically signed by Carlyle Dolly MD Signature Date/Time: 05/15/2020/2:16:14 PM    Final    ECHO TEE  Result Date: 05/16/2020    TRANSESOPHOGEAL ECHO REPORT   Patient Name:   [name] Date of Exam: 05/16/2020 Medical Rec #:  [id number]       Height:       60.0 in Accession #:    [id number]      Weight:       127.9 lb Date of Birth:  02-07-1965        BSA:          1.543 m Patient Age:    22 years        BP:           123/85 mmHg Patient Gender: F               HR:           101 bpm. Exam Location:  [facility] Procedure: Transesophageal Echo, Cardiac Doppler and Color Doppler Indications:    Bacteremia  History:        Patient has prior history of Echocardiogram examinations, most                 recent 05/15/2020. Risk Factors:Dyslipidemia. Cardiac Arrest,                 Elevated Liver Function Test.  Sonographer:    Alvino Chapel RCS Referring Phys: [id number] [place]: The transesophogeal probe was passed without difficulty through the esophogus of the patient. Sedation performed by different physician. The patient developed no  complications during the procedure. IMPRESSIONS  1. Left ventricular ejection fraction, by estimation, is 60 to 65%. The left ventricle has normal function.  2. Right ventricular systolic function is normal. The right ventricular size is normal.  3. Left atrial size was mildly dilated. No left atrial/left atrial appendage thrombus was detected. The LAA emptying velocity was 85 cm/s.  4. Right atrial size was mildly dilated.  5. The mitral valve is  normal in structure. Trivial mitral valve regurgitation. No evidence of mitral stenosis.  6. The aortic valve is tricuspid. Aortic valve regurgitation is not visualized. No aortic stenosis is present. Conclusion(s)/Recommendation(s): No evidence of vegetation/infective endocarditis on this transesophageal echocardiogram. FINDINGS  Left Ventricle: Left ventricular ejection fraction, by estimation, is 60 to 65%. The left ventricle has normal function. The left ventricular internal cavity size was normal in size. Right Ventricle: The right ventricular size is normal. No increase in right ventricular wall thickness. Right ventricular systolic function is normal. Left Atrium: Left atrial size was mildly dilated. No left atrial/left atrial appendage thrombus was detected. The LAA emptying velocity was 85 cm/s. Right Atrium: Right atrial size was mildly dilated. Pericardium: There is no evidence of pericardial effusion. Mitral Valve: The mitral valve is normal in structure. Trivial mitral valve regurgitation. No evidence of mitral valve stenosis. Tricuspid Valve: The tricuspid valve is normal in structure. Tricuspid valve regurgitation is trivial. No evidence of tricuspid stenosis. Aortic Valve: The aortic valve is tricuspid. Aortic valve regurgitation is not visualized. No aortic stenosis is present. Pulmonic Valve: The pulmonic valve was normal in structure. Pulmonic valve regurgitation is not visualized. No evidence of pulmonic stenosis. Aorta: The aortic root is normal in size and structure. IAS/Shunts: No atrial level shunt detected by color flow Doppler. Carlyle Dolly MD Electronically signed by Carlyle Dolly MD Signature Date/Time: 05/16/2020/1:28:24 PM    Final    [name] ASCITES (ABDOMEN LIMITED)  Result Date: 05/14/2020 CLINICAL DATA:  Ascites, question sufficient for paracentesis, clinical concern for spontaneous bacterial peritonitis EXAM: LIMITED ABDOMEN ULTRASOUND FOR ASCITES TECHNIQUE: Limited ultrasound  survey for ascites was performed in all four abdominal quadrants. COMPARISON:  CT abdomen and pelvis 05/13/2020 FINDINGS: Small volume ascites identified in the lower quadrants bilaterally. Volume of ascites has probably slightly increased since prior CT. While the volume of fluid is insufficient for therapeutic paracentesis, a small volume of ascites may be obtainable for diagnostic purposes. Diagnostic paracentesis will be attempted. IMPRESSION: Small volume ascites, likely sufficient volume for diagnostic paracentesis. Electronically Signed   By: Lavonia Dana M.D.   On: 05/14/2020 11:11   [name] EKG SITE RITE  Result Date: 05/17/2020 If Site Rite image not attached, placement could not be confirmed due to current cardiac rhythm.       Discharge Exam: Vitals:   05/21/20 1500 05/21/20 1600  BP: (!) 88/52 108/71  Pulse: 87 95  Resp: (!) 27 (!) 22  Temp:  98 F (36.7 C)  SpO2: 92% 94%   Vitals:   05/21/20 1332 05/21/20 1400 05/21/20 1500 05/21/20 1600  BP: 101/60 114/71 (!) 88/52 108/71  Pulse: 84 87 87 95  Resp:   (!) 27 (!) 22  Temp:    98 F (36.7 C)  TempSrc:    Oral  SpO2:  94% 92% 94%  Weight:      Height:        General: Pt is alert, awake, not in acute distress Cardiovascular: RRR, S1/S2 +, no rubs, no gallops Respiratory: CTA bilaterally, no wheezing, no  rhonchi Abdominal: Soft, NT, ND, bowel sounds + Extremities: no edema, no cyanosis   The results of significant diagnostics from this hospitalization (including imaging, microbiology, ancillary and laboratory) are listed below for reference.    Significant Diagnostic Studies: CT HEAD WO CONTRAST  Result Date: 05/13/2020 CLINICAL DATA:  Fall 2 days prior EXAM: CT HEAD WITHOUT CONTRAST TECHNIQUE: Contiguous axial images were obtained from the base of the skull through the vertex without intravenous contrast. COMPARISON:  CT 02/20/2020 FINDINGS: Brain: No evidence of acute infarction, hemorrhage, hydrocephalus, extra-axial  collection, visible mass lesion or mass effect. Vascular: Atherosclerotic calcification of the carotid siphons. No hyperdense vessel. Skull: No calvarial fracture or suspicious osseous lesion. No scalp swelling or hematoma. Sinuses/Orbits: Paranasal sinuses are predominantly clear. Mastoid air cells appear chronically hypo pneumatized. Middle ear cavities are clear. Debris in the bilateral external auditory canals. Included orbital structures are unremarkable. Other: Asymmetric thickening and calcification in the left parotid gland is a chronic, nonspecific finding, similar to comparison study. IMPRESSION: No acute intracranial abnormality. No calvarial fracture, significant scalp swelling or hematoma. Intracranial atherosclerosis. Asymmetric thickening and calcification in the left parotid gland, a nonspecific, chronic finding unchanged from prior. Can reflect sequela of a chronic inflammatory process. Debris in the external auditory canals, correlate for cerumen impaction. Electronically Signed   By: Lovena Le M.D.   On: 05/13/2020 00:44   CT ABDOMEN PELVIS W CONTRAST  Result Date: 05/13/2020 CLINICAL DATA:  Abdominal pain, fever, right upper quadrant pain EXAM: CT ABDOMEN AND PELVIS WITH CONTRAST TECHNIQUE: Multidetector CT imaging of the abdomen and pelvis was performed using the standard protocol following bolus administration of intravenous contrast. CONTRAST:  138m OMNIPAQUE IOHEXOL 300 MG/ML  SOLN COMPARISON:  CT 02/12/2020 FINDINGS: Lower chest: Basilar atelectatic changes in the lungs including more bandlike areas of subsegmental atelectasis or scarring. Normal heart size. No pericardial effusion. Hepatobiliary: Heterogeneous hepatic attenuation with a nodular liver surface contour concerning for intrinsic liver disease/cirrhosis. No concerning focal liver lesion. Moderate gallbladder distension. No significant gallbladder wall thickening or pericholecystic inflammation is seen. No visible calcified  gallstones or biliary ductal dilatation. Pancreas: Slightly edematous appearance of the pancreatic parenchyma with very faint peripancreatic haze, uniform enhancement. No pancreatic ductal dilatation. Spleen: Mild splenomegaly.  No concerning focal splenic lesion. Adrenals/Urinary Tract: Normal adrenals. Kidneys enhance symmetrically. Symmetric and uniform excretion. No concerning focal renal lesion. Bilateral renal cortical scarring. Nonobstructing calculi present in both kidneys. No obstructive urolithiasis or hydronephrosis is seen at this time. Bladder is unremarkable for the degree of distention. Stomach/Bowel: Paraesophageal venous collaterals and varices. Some minimal collateralization and varices about the gastric fundus as well. Thickening towards the gastric antrum likely reflecting normal peristalsis. Air and fluid-filled appearance of the duodenum with a normal sweep across the midline abdomen. Slightly edematous appearance diffusely through the small bowel is nonspecific given additional findings above. Moderate right-sided colonic stool burden, particularly within the cecum. Some mild circumferential thickening and mucosal hyperemia about the cecum and ascending colon is nonspecific given hepatic features. No evidence of mechanical obstruction. Appendix is not well visualized. Vascular/Lymphatic: Atherosclerotic calcifications within the abdominal aorta and branch vessels. No aneurysm or ectasia. Paraesophageal and gastric varices with venous collaterals, as above. Splenorenal collateralization. Upper abdominal varices.No enlarged abdominopelvic lymph nodes. Few scattered calcified lymph nodes are present in the abdomen and pelvis. Reproductive: Portion the pelvis obscured by streak artifact. 2.6 cm cystic focus in the left adnexa, unchanged from comparison. No concerning right adnexal lesion. Prior hysterectomy. Other: Increasing edematous  changes of the central mesentery. Small volume low-attenuation  fluid in the deep pelvis as well as trace perihepatic ascites. Circumferential body wall edema. No bowel containing hernia. No free abdominopelvic air. Musculoskeletal: Multilevel degenerative changes are present in the imaged portions of the spine. Grade 1 anterolisthesis L4 on L5 without spondylolysis, unchanged from prior. Prior left hip arthroplasty with chronic bony remodeling and a similar appearance overall to comparison prior. IMPRESSION: Constellation of features suggestive of intrinsic liver disease/cirrhosis with features of portal hypertension including a nodular, heterogeneous liver with paraesophageal and gastric varices, additional upper abdominal venous collateralization, splenorenal shunting and splenomegaly. Interval development of a small volume ascites as well as additional features of at least mild or early developing anasarca with circumferential body wall edema and central mesenteric edema. Some hazy peripancreatic stranding may be present, possibly related to a diffusely edematous appearance of the mesentery though could correlate with lipase. Mildly thickened appearance of the cecum and ascending colon as well as a diffusely edematous appearance of the small bowel is nonspecific in the setting of suspected intrinsic liver disease. Could reflect portal enteropathy/colopathy though should exclude clinical symptoms of enterocolitis. Bilateral nonobstructing nephrolithiasis. No obstructive urolithiasis or hydronephrosis at this time. Bilateral renal cortical scarring. Prior hysterectomy. Stable 2.7 cm left ovarian cyst. Continued stability favoring a benign process. No follow-up imaging recommended. Note: This recommendation does not apply to premenarchal patients and to those with increased risk (genetic, family history, elevated tumor markers or other high-risk factors) of ovarian cancer. Reference: JACR 2020 Feb; 17(2):248-254 Aortic Atherosclerosis (ICD10-I70.0). Electronically Signed   By:  Lovena Le M.D.   On: 05/13/2020 00:35   US Abdomen Limited  Result Date: 05/13/2020 CLINICAL DATA:  Right upper quadrant pain. EXAM: ULTRASOUND ABDOMEN LIMITED RIGHT UPPER QUADRANT COMPARISON:  February 23, 2020 ultrasound.  CT scan May 12, 2020. FINDINGS: Gallbladder: A small amount of sludge is seen in the gallbladder. No stones, Murphy's sign, or wall thickening. A small amount of ascites is identified, including adjacent to the gallbladder. Common bile duct: Diameter: 5 mm Liver: There is a nodular contour in the liver. No focal mass. Portal vein is patent on color Doppler imaging with normal direction of blood flow towards the liver. Other: Mild ascites. IMPRESSION: 1. Nodular contour to the liver suggesting cirrhosis.  Mild ascites. 2. Mild sludge in otherwise normal appearing gallbladder. A small amount of fluid adjacent to the gallbladder is likely due to the ascites. Electronically Signed   By: Dorise Bullion III M.D   On: 05/13/2020 09:06   US Paracentesis  Result Date: 05/14/2020 INDICATION: Fever, abdominal pain, question spontaneous bacterial peritonitis, ascites on CT EXAM: ULTRASOUND GUIDED DIAGNOSTIC PARACENTESIS MEDICATIONS: None COMPLICATIONS: None immediate PROCEDURE: Informed written consent was obtained from the patient after a discussion of the risks, benefits and alternatives to treatment. A timeout was performed prior to the initiation of the procedure. Initial ultrasound scanning demonstrates a small amount of ascites within the right lower abdominal quadrant. The right lower abdomen was prepped and draped in the usual sterile fashion. 1% lidocaine was used for local anesthesia. Following this, a 5 [place] Yueh catheter was introduced. An ultrasound image was saved for documentation purposes. The paracentesis was performed. The catheter was removed and a dressing was applied. The patient tolerated the procedure well without immediate post procedural complication. FINDINGS: A total  of approximately 20 mL of clear yellow ascitic fluid was removed. Samples were sent to the laboratory as requested by the clinical team. IMPRESSION: Successful  ultrasound-guided paracentesis yielding 20 mL of peritoneal fluid for diagnostic testing. Electronically Signed   By: Lavonia Dana M.D.   On: 05/14/2020 12:39   DG CHEST PORT 1 VIEW  Result Date: 05/20/2020 CLINICAL DATA:  Pulmonary edema EXAM: PORTABLE CHEST 1 VIEW COMPARISON:  05/16/2020 chest radiograph. FINDINGS: Right PICC terminates over the right atrium. Stable cardiomediastinal silhouette with normal heart size. No pneumothorax. No pleural effusion. Patchy hazy opacities throughout both lungs, significantly improved. IMPRESSION: Significantly improved patchy hazy opacities throughout both lungs, compatible with improving pulmonary edema. Electronically Signed   By: Ilona Sorrel M.D.   On: 05/20/2020 07:32   DG CHEST PORT 1 VIEW  Result Date: 05/16/2020 CLINICAL DATA:  Shortness of breath. EXAM: PORTABLE CHEST 1 VIEW COMPARISON:  Chest x-ray dated May 12, 2020. FINDINGS: The heart size and mediastinal contours are within normal limits. Prominent diffuse interstitial thickening with bilateral perihilar opacities. Small bilateral pleural effusions. No pneumothorax. No acute osseous abnormality. IMPRESSION: 1. Moderate to severe pulmonary edema with small bilateral pleural effusions. Electronically Signed   By: Titus Dubin M.D.   On: 05/16/2020 12:57   DG Chest Portable 1 View  Result Date: 05/12/2020 CLINICAL DATA:  Right sided rib pain. EXAM: PORTABLE CHEST 1 VIEW COMPARISON:  February 12, 2020 FINDINGS: A very mild amount of linear atelectasis is seen within the right lung base. There is no evidence of acute infiltrate, pleural effusion or pneumothorax. The heart size and mediastinal contours are within normal limits. A chronic seventh right rib fracture is seen. IMPRESSION: 1. Very mild right basilar linear atelectasis. 2. Chronic  seventh right rib fracture. Electronically Signed   By: Virgina Norfolk M.D.   On: 05/12/2020 23:16   US ABDOMEN COMPLETE W/ELASTOGRAPHY  Result Date: 05/03/2020 CLINICAL DATA:  Fatty liver EXAM: ULTRASOUND ABDOMEN ULTRASOUND HEPATIC ELASTOGRAPHY TECHNIQUE: Sonography of the upper abdomen was performed. In addition, ultrasound elastography evaluation of the liver was performed. A region of interest was placed within the right lobe of the liver. Following application of a compressive sonographic pulse, tissue compressibility was assessed. Multiple assessments were performed at the selected site. Median tissue compressibility was determined. Previously, hepatic stiffness was assessed by shear wave velocity. Based on recently published Society of Radiologists in Ultrasound consensus article, reporting is now recommended to be performed in the SI units of pressure (kiloPascals) representing hepatic stiffness/elasticity. The obtained result is compared to the published reference standards. (cACLD = compensated Advanced Chronic Liver Disease) COMPARISON:  02/23/2020 FINDINGS: ULTRASOUND ABDOMEN Gallbladder: Gallbladder partially contracted without evidence of gallstones or sonographic Murphy sign. Small amount of internal sludge is present. No wall thickening. Common bile duct: Diameter: 6 mm upper normal Liver: Echogenic hepatic parenchyma with nodular contour consistent with cirrhosis. No discrete hepatic mass. Portal vein is patent on color Doppler imaging with normal direction of blood flow towards the liver. IVC: Normal appearance Pancreas: Normal appearance Spleen: 11.9 cm length, calculated volume 399 mL.  No focal mass. Right Kidney: Length: 11.9 cm. Normal morphology without mass or hydronephrosis. Left Kidney: Length: 12.0 cm. Normal morphology without mass or hydronephrosis. Abdominal aorta: Normal caliber Other findings: Scattered ascites. ULTRASOUND HEPATIC ELASTOGRAPHY Device: Siemens Helix VTQ Patient  position: Not recorded Transducer 5C1 Number of measurements: 10 Hepatic segment:  8 Median kPa: 95.8 IQR: 11.6 IQR/Median kPa ratio: 0.1 Data quality:  Good Diagnostic category: > or =17 kPa: highly suggestive of cACLD with an increased probability of clinically significant portal hypertension The use of hepatic elastography is applicable to  patients with viral hepatitis and non-alcoholic fatty liver disease. At this time, there is insufficient data for the referenced cut-off values and use in other causes of liver disease, including alcoholic liver disease. Patients, however, may be assessed by elastography and serve as their own reference standard/baseline. In patients with non-alcoholic liver disease, the values suggesting compensated advanced chronic liver disease (cACLD) may be lower, and patients may need additional testing with elasticity results of 7-9 kPa. Please note that abnormal hepatic elasticity and shear wave velocities may also be identified in clinical settings other than with hepatic fibrosis, such as: acute hepatitis, elevated right heart and central venous pressures including use of beta blockers, veno-occlusive disease (Budd-Chiari), infiltrative processes such as mastocytosis/amyloidosis/infiltrative tumor/lymphoma, extrahepatic cholestasis, with hyperemia in the post-prandial state, and with liver transplantation. Correlation with patient history, laboratory data, and clinical condition recommended. Diagnostic Categories: < or =5 kPa: high probability of being normal < or =9 kPa: in the absence of other known clinical signs, rules out cACLD >9 kPa and ?13 kPa: suggestive of cACLD, but needs further testing >13 kPa: highly suggestive of cACLD > or =17 kPa: highly suggestive of cACLD with an increased probability of clinically significant portal hypertension IMPRESSION: ULTRASOUND ABDOMEN: Cirrhotic appearing liver without focal mass. ULTRASOUND HEPATIC ELASTOGRAPHY: Median kPa:  95.8  Diagnostic category: > or =17 kPa: highly suggestive of cACLD with an increased probability of clinically significant portal hypertension Electronically Signed   By: Lavonia Dana M.D.   On: 05/03/2020 17:09   ECHOCARDIOGRAM COMPLETE  Result Date: 05/15/2020    ECHOCARDIOGRAM REPORT   Patient Name:   [name] Date of Exam: 05/15/2020 Medical Rec #:  [id number]       Height:       60.0 in Accession #:    [id number]      Weight:       127.9 lb Date of Birth:  [birth year]        BSA:          1.543 m Patient Age:    71 years        BP:           88/64 mmHg Patient Gender: F               HR:           95 bpm. Exam Location:  [facility] Procedure: 2D Echo, Cardiac Doppler and Color Doppler Indications:    Bacteremia R78.81  History:        Patient has prior history of Echocardiogram examinations, most                 recent 02/23/2020. Risk Factors:Dyslipidemia. Cardiac Arrest,                 Elevated Liver Function Test.  Sonographer:    Alvino Chapel RCS Referring Phys: [id number] [place]  1. Left ventricular ejection fraction, by estimation, is 60 to 65%. The left ventricle has normal function. The left ventricle has no regional wall motion abnormalities. Left ventricular diastolic parameters are indeterminate.  2. Right ventricular systolic function is normal. The right ventricular size is normal.  3. Left atrial size was moderately dilated.  4. The mitral valve is normal in structure. No evidence of mitral valve regurgitation. No evidence of mitral stenosis.  5. The aortic valve has an indeterminant number of cusps. Aortic valve regurgitation is not visualized. No aortic stenosis is present.  6. The inferior vena cava  is normal in size with greater than 50% respiratory variability, suggesting right atrial pressure of 3 mmHg. FINDINGS  Left Ventricle: Left ventricular ejection fraction, by estimation, is 60 to 65%. The left ventricle has normal function. The left ventricle has no regional wall  motion abnormalities. The left ventricular internal cavity size was normal in size. There is  no left ventricular hypertrophy. Left ventricular diastolic parameters are indeterminate. Right Ventricle: The right ventricular size is normal. No increase in right ventricular wall thickness. Right ventricular systolic function is normal. Left Atrium: Left atrial size was moderately dilated. Right Atrium: Right atrial size was not well visualized. Pericardium: There is no evidence of pericardial effusion. Mitral Valve: The mitral valve is normal in structure. No evidence of mitral valve regurgitation. No evidence of mitral valve stenosis. Tricuspid Valve: The tricuspid valve is normal in structure. Tricuspid valve regurgitation is not demonstrated. No evidence of tricuspid stenosis. Aortic Valve: The aortic valve has an indeterminant number of cusps. Aortic valve regurgitation is not visualized. No aortic stenosis is present. Aortic valve mean gradient measures 5.1 mmHg. Aortic valve peak gradient measures 8.2 mmHg. Aortic valve area, by VTI measures 2.13 cm. Pulmonic Valve: The pulmonic valve was not well visualized. Pulmonic valve regurgitation is not visualized. No evidence of pulmonic stenosis. Aorta: The aortic root is normal in size and structure. Pulmonary Artery: Inadequate TR jet, indeterminant PASP. Venous: The inferior vena cava is normal in size with greater than 50% respiratory variability, suggesting right atrial pressure of 3 mmHg. IAS/Shunts: No atrial level shunt detected by color flow Doppler.  LEFT VENTRICLE PLAX 2D LVIDd:         4.80 cm  Diastology LVIDs:         3.10 cm  LV e' medial:    10.60 cm/s LV PW:         1.00 cm  LV E/e' medial:  11.3 LV IVS:        1.00 cm  LV e' lateral:   9.14 cm/s LVOT diam:     1.80 cm  LV E/e' lateral: 13.1 LV SV:         64 LV SV Index:   41 LVOT Area:     2.54 cm  RIGHT VENTRICLE RV S prime:     16.30 cm/s TAPSE (M-mode): 2.6 cm LEFT ATRIUM             Index        RIGHT ATRIUM           Index LA diam:        4.20 cm 2.72 cm/m  RA Area:     14.20 cm LA Vol (A2C):   53.8 ml 34.86 ml/m RA Volume:   36.10 ml  23.39 ml/m LA Vol (A4C):   64.2 ml 41.60 ml/m LA Biplane Vol: 62.5 ml 40.49 ml/m  AORTIC VALVE AV Area (Vmax):    2.40 cm AV Area (Vmean):   2.00 cm AV Area (VTI):     2.13 cm AV Vmax:           143.34 cm/s AV Vmean:          109.425 cm/s AV VTI:            0.301 m AV Peak Grad:      8.2 mmHg AV Mean Grad:      5.1 mmHg LVOT Vmax:         135.00 cm/s LVOT Vmean:  86.200 cm/s LVOT VTI:          0.251 m LVOT/AV VTI ratio: 0.84  AORTA Ao Root diam: 3.00 cm MITRAL VALVE MV Area (PHT): 3.97 cm     SHUNTS MV Decel Time: 191 msec     Systemic VTI:  0.25 m MV E velocity: 120.00 cm/s  Systemic Diam: 1.80 cm MV A velocity: 87.30 cm/s MV E/A ratio:  1.37 Carlyle Dolly MD Electronically signed by Carlyle Dolly MD Signature Date/Time: 05/15/2020/2:16:14 PM    Final    ECHO TEE  Result Date: 05/16/2020    TRANSESOPHOGEAL ECHO REPORT   Patient Name:   [name] Date of Exam: 05/16/2020 Medical Rec #:  [id number]       Height:       60.0 in Accession #:    [id number]      Weight:       127.9 lb Date of Birth:  [birth year]        BSA:          1.543 m Patient Age:    18 years        BP:           123/85 mmHg Patient Gender: F               HR:           101 bpm. Exam Location:  [facility] Procedure: Transesophageal Echo, Cardiac Doppler and Color Doppler Indications:    Bacteremia  History:        Patient has prior history of Echocardiogram examinations, most                 recent 05/15/2020. Risk Factors:Dyslipidemia. Cardiac Arrest,                 Elevated Liver Function Test.  Sonographer:    Alvino Chapel RCS Referring Phys: [id number] [place]: The transesophogeal probe was passed without difficulty through the esophogus of the patient. Sedation performed by different physician. The patient developed no complications during the procedure.  IMPRESSIONS  1. Left ventricular ejection fraction, by estimation, is 60 to 65%. The left ventricle has normal function.  2. Right ventricular systolic function is normal. The right ventricular size is normal.  3. Left atrial size was mildly dilated. No left atrial/left atrial appendage thrombus was detected. The LAA emptying velocity was 85 cm/s.  4. Right atrial size was mildly dilated.  5. The mitral valve is normal in structure. Trivial mitral valve regurgitation. No evidence of mitral stenosis.  6. The aortic valve is tricuspid. Aortic valve regurgitation is not visualized. No aortic stenosis is present. Conclusion(s)/Recommendation(s): No evidence of vegetation/infective endocarditis on this transesophageal echocardiogram. FINDINGS  Left Ventricle: Left ventricular ejection fraction, by estimation, is 60 to 65%. The left ventricle has normal function. The left ventricular internal cavity size was normal in size. Right Ventricle: The right ventricular size is normal. No increase in right ventricular wall thickness. Right ventricular systolic function is normal. Left Atrium: Left atrial size was mildly dilated. No left atrial/left atrial appendage thrombus was detected. The LAA emptying velocity was 85 cm/s. Right Atrium: Right atrial size was mildly dilated. Pericardium: There is no evidence of pericardial effusion. Mitral Valve: The mitral valve is normal in structure. Trivial mitral valve regurgitation. No evidence of mitral valve stenosis. Tricuspid Valve: The tricuspid valve is normal in structure. Tricuspid valve regurgitation is trivial. No evidence of tricuspid stenosis. Aortic Valve: The aortic valve  is tricuspid. Aortic valve regurgitation is not visualized. No aortic stenosis is present. Pulmonic Valve: The pulmonic valve was normal in structure. Pulmonic valve regurgitation is not visualized. No evidence of pulmonic stenosis. Aorta: The aortic root is normal in size and structure. IAS/Shunts: No  atrial level shunt detected by color flow Doppler. Carlyle Dolly MD Electronically signed by Carlyle Dolly MD Signature Date/Time: 05/16/2020/1:28:24 PM    Final    [name] ASCITES (ABDOMEN LIMITED)  Result Date: 05/14/2020 CLINICAL DATA:  Ascites, question sufficient for paracentesis, clinical concern for spontaneous bacterial peritonitis EXAM: LIMITED ABDOMEN ULTRASOUND FOR ASCITES TECHNIQUE: Limited ultrasound survey for ascites was performed in all four abdominal quadrants. COMPARISON:  CT abdomen and pelvis 05/13/2020 FINDINGS: Small volume ascites identified in the lower quadrants bilaterally. Volume of ascites has probably slightly increased since prior CT. While the volume of fluid is insufficient for therapeutic paracentesis, a small volume of ascites may be obtainable for diagnostic purposes. Diagnostic paracentesis will be attempted. IMPRESSION: Small volume ascites, likely sufficient volume for diagnostic paracentesis. Electronically Signed   By: Lavonia Dana M.D.   On: 05/14/2020 11:11   [name] EKG SITE RITE  Result Date: 05/17/2020 If Site Rite image not attached, placement could not be confirmed due to current cardiac rhythm.    Microbiology: Recent Results (from the past 240 hour(s))  Culture, blood (routine x 2)     Status: Abnormal   Collection Time: 05/12/20 10:45 PM   Specimen: BLOOD LEFT FOREARM  Result Value Ref Range Status   Specimen Description   Final    BLOOD LEFT FOREARM Performed at [facility], [address], [place], [place] [address]    Special Requests   Final    Blood Culture results may not be optimal due to an excessive volume of blood received in culture bottles BOTTLES DRAWN AEROBIC AND ANAEROBIC Performed at [facility], [address], [place], [place] [address]    Culture  Setup Time   Final    GRAM POSITIVE COCCI BOTH ANAEROBIC AND AEROBIC BOTTLES Gram Stain Report Called to,Read Back By and Verified With: GANTT,E'@1419'  BY MATTHEWS, B 5.1.22 Organism ID  to follow CRITICAL RESULT CALLED TO, READ BACK BY AND VERIFIED WITH: N,ONDARA RN '@2130'  05/13/20 AH  Performed at [facility], [place] [address] [place], [place] [address]    Culture STAPHYLOCOCCUS AUREUS (A)  Final   Report Status 05/15/2020 FINAL  Final   Organism ID, Bacteria STAPHYLOCOCCUS AUREUS  Final      Susceptibility   Staphylococcus aureus - MIC*    CIPROFLOXACIN >=8 RESISTANT Resistant     ERYTHROMYCIN >=8 RESISTANT Resistant     GENTAMICIN <=0.5 SENSITIVE Sensitive     OXACILLIN <=0.25 SENSITIVE Sensitive     TETRACYCLINE <=1 SENSITIVE Sensitive     VANCOMYCIN <=0.5 SENSITIVE Sensitive     TRIMETH/SULFA <=10 SENSITIVE Sensitive     CLINDAMYCIN <=0.25 SENSITIVE Sensitive     RIFAMPIN <=0.5 SENSITIVE Sensitive     Inducible Clindamycin NEGATIVE Sensitive     * STAPHYLOCOCCUS AUREUS  Blood Culture ID Panel (Reflexed)     Status: Abnormal   Collection Time: 05/12/20 10:45 PM  Result Value Ref Range Status   Enterococcus faecalis NOT DETECTED NOT DETECTED Final   Enterococcus Faecium NOT DETECTED NOT DETECTED Final   Listeria monocytogenes NOT DETECTED NOT DETECTED Final   Staphylococcus species DETECTED (A) NOT DETECTED Final    Comment: CRITICAL RESULT CALLED TO, READ BACK BY AND VERIFIED WITH: NACKSON ONDARA,RN  AT 2132 05/13/2020 A.HUGHES    Staphylococcus aureus (BCID) DETECTED (A) NOT DETECTED Final    Comment: CRITICAL RESULT CALLED TO, READ BACK BY AND VERIFIED WITH: Valentino Hue, RN AT  2132 05/13/2020 A.HUGHES    Staphylococcus epidermidis NOT DETECTED NOT DETECTED Final   Staphylococcus lugdunensis NOT DETECTED NOT DETECTED Final   Streptococcus species NOT DETECTED NOT DETECTED Final   Streptococcus agalactiae NOT DETECTED NOT DETECTED Final   Streptococcus pneumoniae NOT DETECTED NOT DETECTED Final   Streptococcus pyogenes NOT DETECTED NOT DETECTED Final   A.calcoaceticus-baumannii NOT DETECTED NOT DETECTED Final   Bacteroides fragilis NOT DETECTED  NOT DETECTED Final   Enterobacterales NOT DETECTED NOT DETECTED Final   Enterobacter cloacae complex NOT DETECTED NOT DETECTED Final   Escherichia coli NOT DETECTED NOT DETECTED Final   Klebsiella aerogenes NOT DETECTED NOT DETECTED Final   Klebsiella oxytoca NOT DETECTED NOT DETECTED Final   Klebsiella pneumoniae NOT DETECTED NOT DETECTED Final   Proteus species NOT DETECTED NOT DETECTED Final   Salmonella species NOT DETECTED NOT DETECTED Final   Serratia marcescens NOT DETECTED NOT DETECTED Final   Haemophilus influenzae NOT DETECTED NOT DETECTED Final   Neisseria meningitidis NOT DETECTED NOT DETECTED Final   Pseudomonas aeruginosa NOT DETECTED NOT DETECTED Final   Stenotrophomonas maltophilia NOT DETECTED NOT DETECTED Final   Candida albicans NOT DETECTED NOT DETECTED Final   Candida auris NOT DETECTED NOT DETECTED Final   Candida glabrata NOT DETECTED NOT DETECTED Final   Candida krusei NOT DETECTED NOT DETECTED Final   Candida parapsilosis NOT DETECTED NOT DETECTED Final   Candida tropicalis NOT DETECTED NOT DETECTED Final   Cryptococcus neoformans/gattii NOT DETECTED NOT DETECTED Final   Meth resistant mecA/C and MREJ NOT DETECTED NOT DETECTED Final    Comment: Performed at [facility], [address], [place], [place] [address]  Resp Panel by RT-PCR (Flu A&B, Covid) Nasopharyngeal Swab     Status: None   Collection Time: 05/12/20 10:46 PM   Specimen: Nasopharyngeal Swab; Nasopharyngeal(NP) swabs in vial transport medium  Result Value Ref Range Status   SARS Coronavirus 2 by RT PCR NEGATIVE NEGATIVE Final    Comment: (NOTE) SARS-CoV-2 target nucleic acids are NOT DETECTED.  The SARS-CoV-2 RNA is generally detectable in upper respiratory specimens during the acute phase of infection. The lowest concentration of SARS-CoV-2 viral copies this assay can detect is 138 copies/mL. A negative result does not preclude SARS-Cov-2 infection and should not be used as the sole  basis for treatment or other patient management decisions. A negative result may occur with  improper specimen collection/handling, submission of specimen other than nasopharyngeal swab, presence of viral mutation(s) within the areas targeted by this assay, and inadequate number of viral copies(<138 copies/mL). A negative result must be combined with clinical observations, patient history, and epidemiological information. The expected result is Negative.  Fact Sheet for Patients:  EntrepreneurPulse.com.au  Fact Sheet for Healthcare Providers:  IncredibleEmployment.be  This test is no t yet approved or cleared by the [place] FDA and  has been authorized for detection and/or diagnosis of SARS-CoV-2 by FDA under an Emergency Use Authorization (EUA). This EUA will remain  in effect (meaning this test can be used) for the duration of the COVID-19 declaration under Section 564(b)(1) of the Act, 21 U.S.C.section 360bbb-3(b)(1), unless the authorization is terminated  or revoked sooner.       Influenza A by PCR NEGATIVE NEGATIVE Final   Influenza B by PCR NEGATIVE NEGATIVE  Final    Comment: (NOTE) The Xpert Xpress SARS-CoV-2/FLU/RSV plus assay is intended as an aid in the diagnosis of influenza from Nasopharyngeal swab specimens and should not be used as a sole basis for treatment. Nasal washings and aspirates are unacceptable for Xpert Xpress SARS-CoV-2/FLU/RSV testing.  Fact Sheet for Patients: EntrepreneurPulse.com.au  Fact Sheet for Healthcare Providers: IncredibleEmployment.be  This test is not yet approved or cleared by the [place] FDA and has been authorized for detection and/or diagnosis of SARS-CoV-2 by FDA under an Emergency Use Authorization (EUA). This EUA will remain in effect (meaning this test can be used) for the duration of the COVID-19 declaration under Section 564(b)(1) of the Act,  21 U.S.C. section 360bbb-3(b)(1), unless the authorization is terminated or revoked.  Performed at [facility], [address], [place], [place] [address]   Culture, blood (routine x 2)     Status: Abnormal   Collection Time: 05/12/20 11:10 PM   Specimen: BLOOD RIGHT HAND  Result Value Ref Range Status   Specimen Description   Final    BLOOD RIGHT HAND Performed at [facility], [address] [place] [address], [place], [place] [address]    Special Requests   Final    Blood Culture adequate volume BOTTLES DRAWN AEROBIC AND ANAEROBIC Performed at [facility], [address], [place], [place] [address]    Culture  Setup Time   Final    GRAM POSITIVE COCCI ANAEROBIC AND AEROBIC BOTTLES Gram Stain Report Called to,Read Back By and Verified With: GANTT,E'@1419'  BY MATTHEWS B 5.1.22 CRITICAL VALUE NOTED.  VALUE IS CONSISTENT WITH PREVIOUSLY REPORTED AND CALLED VALUE.    Culture (A)  Final    STAPHYLOCOCCUS AUREUS SUSCEPTIBILITIES PERFORMED ON PREVIOUS CULTURE WITHIN THE LAST 5 DAYS. Performed at [facility], [place] [address] [place], [place] [address]    Report Status 05/15/2020 FINAL  Final  Culture, Urine     Status: Abnormal   Collection Time: 05/13/20  1:41 AM   Specimen: Urine, Clean Catch  Result Value Ref Range Status   Specimen Description   Final    URINE, CLEAN CATCH Performed at [facility], [address] [place], [place] [address]    Special Requests   Final    NONE Performed at [facility], [address], [place],  [address]    Culture >=100,000 COLONIES/mL STAPHYLOCOCCUS AUREUS (A)  Final   Report Status 05/16/2020 FINAL  Final   Organism ID, Bacteria STAPHYLOCOCCUS AUREUS (A)  Final      Susceptibility   Staphylococcus aureus - MIC*    CIPROFLOXACIN >=8 RESISTANT Resistant     GENTAMICIN <=0.5 SENSITIVE Sensitive     NITROFURANTOIN <=16 SENSITIVE Sensitive     OXACILLIN <=0.25 SENSITIVE Sensitive     TETRACYCLINE <=1 SENSITIVE Sensitive     VANCOMYCIN <=0.5  SENSITIVE Sensitive     TRIMETH/SULFA <=10 SENSITIVE Sensitive     CLINDAMYCIN <=0.25 SENSITIVE Sensitive     RIFAMPIN <=0.5 SENSITIVE Sensitive     Inducible Clindamycin NEGATIVE Sensitive     * >=100,000 COLONIES/mL STAPHYLOCOCCUS AUREUS  Culture, blood (single)     Status: None   Collection Time: 05/14/20 10:08 AM   Specimen: BLOOD  Result Value Ref Range Status   Specimen Description BLOOD BLOOD LEFT HAND  Final   Special Requests   Final    Blood Culture results may not be optimal due to an inadequate volume of blood received in culture bottles BOTTLES DRAWN AEROBIC AND ANAEROBIC   Culture  Final    NO GROWTH 5 DAYS Performed at [facility], [address], [place], [place] [address]    Report Status 05/19/2020 FINAL  Final  Body fluid culture w Gram Stain     Status: None   Collection Time: 05/14/20 12:16 PM   Specimen: Ascitic; Body Fluid  Result Value Ref Range Status   Specimen Description   Final    ASCITIC Performed at [facility], [address], [place], [place] [address]    Special Requests   Final    NONE Performed at [facility], [address], [place], [place] [address]    Gram Stain NO WBC SEEN NO ORGANISMS SEEN   Final   Culture   Final    NO GROWTH 3 DAYS Performed at [facility], [place] [address], [place], [place] [address]    Report Status 05/17/2020 FINAL  Final  MRSA PCR Screening     Status: None   Collection Time: 05/16/20  2:57 PM   Specimen: Nasal Mucosa; Nasopharyngeal  Result Value Ref Range Status   MRSA by PCR NEGATIVE NEGATIVE Final    Comment:        The GeneXpert MRSA Assay (FDA approved for NASAL specimens only), is one component of a comprehensive MRSA colonization surveillance program. It is not intended to diagnose MRSA infection nor to guide or monitor treatment for MRSA infections. Performed at [facility], [address], [place], [place] [address]      Labs: Basic Metabolic Panel: Recent Labs  Lab  05/16/20 1620 05/17/20 0444 05/18/20 0504 05/19/20 0421 05/20/20 0459  NA 134* 136 135 135 137  K 3.3* 3.6 2.9* 3.3* 3.5  CL 104 104 103 101 101  CO2 '23 25 25 27 26  ' GLUCOSE 111* 109* 97 105* 112*  BUN '9 10 12 11 12  ' CREATININE 0.50 0.45 0.44 0.47 0.48  CALCIUM 7.5* 7.6* 7.5* 7.4* 7.7*  MG  --  1.9 1.9 1.7 2.1   Liver Function Tests: Recent Labs  Lab 05/16/20 1620 05/17/20 0444 05/20/20 0459  AST 109* 104* 111*  ALT 43 41 36  ALKPHOS 131* 138* 143*  BILITOT 1.8* 1.6* 1.5*  PROT 5.3* 5.1* 5.4*  ALBUMIN 2.3* 2.3* 2.3*   No results for input(s): LIPASE, AMYLASE in the last 168 hours. No results for input(s): AMMONIA in the last 168 hours. CBC: Recent Labs  Lab 05/16/20 1620 05/17/20 0444 05/20/20 0459  WBC 11.2* 13.1* 8.6  HGB 10.0* 9.7* 9.5*  HCT 29.1* 28.9* 28.3*  MCV 89.3 89.8 88.7  PLT 197 219 235   Cardiac Enzymes: No results for input(s): CKTOTAL, CKMB, CKMBINDEX, TROPONINI in the last 168 hours. BNP: Invalid input(s): POCBNP CBG: No results for input(s): GLUCAP in the last 168 hours.  Time coordinating discharge:  36 minutes  Signed:  Orson Eva, DO [facility] Pager: [phone] 05/21/2020, 4:42 PM

## 2020-05-21 NOTE — TOC Progression Note (Deleted)
Transition of Care [facility]) - Progression Note    Patient Details  Name: [name] MRN: [id number] Date of Birth: Jun 23, 1965  [facility]) CM/SW Contact  Boneta Lucks, RN Phone Number: 05/21/2020, 3:22 PM  Clinical Narrative:   PT is coming to [facility] to say patient really needs SNF. TOC spoke with patient again. We discussed his concerns at home with landlord and paper work needing to be completed. Patient gave permission for [facility] to call his sister. She is agreeing to go get the papers and help patient any way she can.  TOC offer to assist with copies and mailing to allow patient to go to SNF.  Sent out to [facility] for a bed offer. TOC to follow.

## 2020-05-21 NOTE — TOC Progression Note (Signed)
Transition of Care [facility]) - Progression Note    Patient Details  Name: [name] MRN: [id number] Date of Birth: [birth year]  Transition of Care [facility]) CM/SW Contact  Boneta Lucks, RN Phone Number: 05/21/2020, 3:30 PM  Clinical Narrative:   Reviewing bed offers with family. They agreed to [facility] is starting Ins Auth.    Expected Discharge Plan: Skilled Nursing Facility Barriers to Discharge: Continued Medical Work up  Expected Discharge Plan and Services Expected Discharge Plan: [place]: RN,PT [facility] Agency: [place] Date [place]: 05/14/20 Time [place]: 53 Representative spoke with at [place]: [place]   Readmission Risk Interventions Readmission Risk Prevention Plan 05/14/2020 02/24/2020 02/14/2020  Medication Screening - Complete Complete  Transportation Screening Complete Complete Complete  Home Care Screening Complete - -  Medication Review (RN CM) Complete - -  Some recent data might be hidden

## 2020-05-21 NOTE — TOC Progression Note (Signed)
30 Day Note    Patient Details  Name: [name] MRN: [id number] Date of Birth: [birth year]  To whom it may concern:  Please be advised that [name] will require a short-term nursing home stay. Anticipated 30 days or less rebilitation and strengthening. The plan is to return home.

## 2020-05-22 ENCOUNTER — Encounter (HOSPITAL_COMMUNITY): Payer: Self-pay | Admitting: Cardiology

## 2020-05-22 ENCOUNTER — Inpatient Hospital Stay (HOSPITAL_COMMUNITY): Payer: Medicare HMO

## 2020-05-22 DIAGNOSIS — R188 Other ascites: Secondary | ICD-10-CM | POA: Diagnosis not present

## 2020-05-22 DIAGNOSIS — G9341 Metabolic encephalopathy: Secondary | ICD-10-CM | POA: Diagnosis not present

## 2020-05-22 DIAGNOSIS — K7290 Hepatic failure, unspecified without coma: Secondary | ICD-10-CM

## 2020-05-22 DIAGNOSIS — R7989 Other specified abnormal findings of blood chemistry: Secondary | ICD-10-CM | POA: Diagnosis not present

## 2020-05-22 DIAGNOSIS — J9601 Acute respiratory failure with hypoxia: Secondary | ICD-10-CM | POA: Diagnosis not present

## 2020-05-22 LAB — COMPREHENSIVE METABOLIC PANEL
ALT: 59 U/L — ABNORMAL HIGH (ref 0–44)
AST: 248 U/L — ABNORMAL HIGH (ref 15–41)
Albumin: 2.5 g/dL — ABNORMAL LOW (ref 3.5–5.0)
Alkaline Phosphatase: 167 U/L — ABNORMAL HIGH (ref 38–126)
Anion gap: 12 (ref 5–15)
BUN: 17 mg/dL (ref 6–20)
CO2: 23 mmol/L (ref 22–32)
Calcium: 8 mg/dL — ABNORMAL LOW (ref 8.9–10.3)
Chloride: 109 mmol/L (ref 98–111)
Creatinine, Ser: 0.8 mg/dL (ref 0.44–1.00)
GFR, Estimated: >60 mL/min (ref >60)
Glucose, Bld: 89 mg/dL (ref 70–99)
Potassium: 5.1 mmol/L (ref 3.5–5.1)
Sodium: 144 mmol/L (ref 135–145)
Total Bilirubin: 2.4 mg/dL — ABNORMAL HIGH (ref 0.3–1.2)
Total Protein: 5.5 g/dL — ABNORMAL LOW (ref 6.5–8.1)

## 2020-05-22 LAB — PROTIME-INR
INR: 2.1 — ABNORMAL HIGH (ref 0.8–1.2)
Prothrombin Time: 23.4 seconds — ABNORMAL HIGH (ref 11.4–15.2)

## 2020-05-22 LAB — PROCALCITONIN: Procalcitonin: 0.46 ng/mL

## 2020-05-22 LAB — MAGNESIUM: Magnesium: 1.7 mg/dL (ref 1.7–2.4)

## 2020-05-22 LAB — AMMONIA
Ammonia: 64 umol/L — ABNORMAL HIGH (ref 9–35)
Ammonia: 60 umol/L — ABNORMAL HIGH (ref 9–35)

## 2020-05-22 MED ORDER — FUROSEMIDE 40 MG PO TABS
40.0000 mg | ORAL_TABLET | Freq: Every day | ORAL | Status: DC
  Administered 2020-05-23 – 2020-05-27 (×5): 40 mg via ORAL
  Filled 2020-05-22 (×6): qty 1

## 2020-05-22 MED ORDER — LACTULOSE 10 GM/15ML PO SOLN
20.0000 g | Freq: Three times a day (TID) | ORAL | Status: DC
  Administered 2020-05-22 – 2020-05-27 (×14): 20 g via ORAL
  Filled 2020-05-22 (×15): qty 30

## 2020-05-22 NOTE — Care Management Important Message (Signed)
Important Message  Patient Details  Name: [name] MRN: [id number] Date of Birth: 28-Jun-1965   Medicare Important Message Given:  Yes     Tommy Medal 05/22/2020, 12:26 PM

## 2020-05-22 NOTE — Plan of Care (Signed)

## 2020-05-22 NOTE — Progress Notes (Signed)
Physical Therapy Treatment Patient Details Name: [name] MRN: [id number] DOB: 09-25-1965 Today's Date: 05/22/2020    History of Present Illness [name]  is a 55 y.o. female, with history of seizures, mood disorder, benign brain tumor, fatty liver, hypercholesterol, and more presents the ED with a chief complaint of fall.  Patient reports that she did prior to presentation.  She reports that her house is being remodeled and she tripped on something and fell forward hit her head on the railing and landed on her right chest.  She reports that she hit the back of her head on the way down.  She had no loss of consciousness.  She has had no change in vision or hearing.  She reports that prior to the fall she did have dizziness, chest pain, nausea, but these are chronic daily symptoms for her.  She did not have palpitations.  Her main complaint at this time is that the right side of her chest hurts.  It is worse with deep breaths and with position changes.  She tried Tylenol at home with no relief.  She does admit to having fever prior to coming to the ED today.  She reports a T-max at home of 103.  At presentation her temperature was 101.  She denies cough.  She admits to urinary frequency but no dysuria or urgency.  She has been constipated but did take a laxative yesterday with adequate response.  She reports she had a UTI a month ago and had antibiotics for it and the symptoms cleared up.  She has no rashes or open sores on her body.  She reports nausea and vomiting x3 today prior to coming in on the ambulance.  There is nonbloody emesis.  She has no other complaints at this time.    PT Comments    Pt laying diagonal in bed with head into extension posture; pt responded to therapist but would not open her eyes.  Pt agreeable to therapy as she wanted to get into a chair.  Pt required mod -max assist for transfers and unable to come to seated EOB posturing due to extreme trunk extension today.   LE's also remained in extension and had to be placed by therapist in bent knee positiioning with feet on floor.  Standing was not attempted due to inablity to establish or maintain seated posturing.  Max transfer completed to chair with limited WBing through LE"s.  Spoke with nursing regarding concerns and also she was hot to touch.  Nursing assessing patient as therapist completed session.     Follow Up Recommendations        Equipment Recommendations       Recommendations for Other Services       Precautions / Restrictions Precautions Precautions: Fall    Mobility  Bed Mobility Overal bed mobility: Needs Assistance Bed Mobility: Supine to Sit     Supine to sit: Mod assist;Max assist     General bed mobility comments: increased time, labored movement, very stiff in trunk and LE's    Transfers Overall transfer level: Needs assistance   Transfers: Stand Pivot Transfers   Stand pivot transfers: Max assist       General transfer comment: Pt unable to work on standing and standing tolerance as trunk and LE's too stiff.  Transfer only       Cognition Arousal/Alertness: Awake/alert Behavior During Therapy: WFL for tasks assessed/performed;Anxious Overall Cognitive Status: Impaired/Different from baseline  General Comments: Pt in extension posture, clenched and stiff.  difficulty getting her to open her eyes         General Comments        Pertinent Vitals/Pain Pain Assessment: No/denies pain            AM-PAC PT "6 Clicks" Mobility   Outcome Measure  Help needed turning from your back to your side while in a flat bed without using bedrails?: A Lot Help needed moving from lying on your back to sitting on the side of a flat bed without using bedrails?: A Lot Help needed moving to and from a bed to a chair (including a wheelchair)?: A Lot Help needed standing up from a chair using your arms (e.g., wheelchair or  bedside chair)?: A Lot Help needed to walk in hospital room?: A Lot Help needed climbing 3-5 steps with a railing? : Total 6 Click Score: 11    End of Session Equipment Utilized During Treatment: Gait belt Activity Tolerance: Treatment limited secondary to medical complications (Comment);Other (comment) Patient left: in chair;with call bell/phone within reach;with chair alarm set Nurse Communication: Mobility status PT Visit Diagnosis: Unsteadiness on feet (R26.81);Other abnormalities of gait and mobility (R26.89);Muscle weakness (generalized) (M62.81)     Time: 1000-1015 PT Time Calculation (min) (ACUTE ONLY): 15 min  Charges:  $Therapeutic Activity: 8-22 mins                    Teena Irani, PTA/CLT [place], Sacha Topor B 05/22/2020, 1:58 PM

## 2020-05-22 NOTE — Progress Notes (Signed)
PROGRESS NOTE  [name] [id number] DOB: [birth year] DOA: 05/12/2020 PCP: [name], MD   Brief History:  55 year old female with a history of COPD, meningioma, cardiac arrest December 2020, hepatic steatosis,PNES, cognitive impairment, depression, complex partial seizure presentingwithgeneralized weakness and a fall.This has been a recurrent problem for the patient. She was admitted on 2/7 to 02/24/20 with a similar presentation. At that time she was treated for acute metabolic encephalopathy which was thought to be multifactorial including elevated ammonia, dehydration and dopamine agonist. She was discharged home with HHPT. She follow up with [facility], Dr. 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 She underwent liver elastography on 05/03/20 which suggested cACLD with increased probability of clinically significant portal HTN. She presented on 05/12/20 with another mechanical fall.She reports that her house is being remodeled and she tripped on something and fell forward hit her head on the railing and landed on her right chest. She reports that she hit the back of her head on the way down. She had no loss of consciousness. At presentation her temperature was 101.Blood cultures were done and she was found to have MSSA bacteremia. Also CT of the abd/pelvis on 05/13/20 showed signs of cirrhosis with portal HTN. She also had splenomegaly. THere was also a mildly thickened cecum and ascending colon with diffuse edematous appearance of the sm bowel. She underwent TEE on 05/16/20 after which she remained hypoxic on NRB.  TEE was negative for vegetation. She was then transferred to SDU for further care.She was started on IV lasix with clinical improvement.  Assessment/Plan: Severe Sepsis -present on admission -presented with fever, tachycardia, tachypnea, elevated lactate -due to MSSA bacteremia -Lactic peaked  2.2>>1.8 -UA 6-10 WBC -4/30CXR--no consolidation -Random cortisol level 17.1 -sepsis physiology resolved  MSSA Bacteremia -05/12/20 blood culture--positive MSSA -05/14/20 blood cuture--neg to date -continue cefazolin -05/16/20 TEE--no vegetation -PICC line 5/6 if surveillance blood cultures remain neg -plan abx till 5/29  Hepatic Encephalopathy/Acute metabolic Encephalopathy -Over last 48 hours, pt more confused, slower to answer questions -multifactorial including opioids, hypnotic meds, elevated ammonia -UA--neg for pyuria -05/22/20 ammonia--64 -start lactulose  Acute respiratory failure with hypoxia -oxygen saturation 88-90% on NRB in PACU 5/4 -personally reviewed CXR--bilateral interstitial infiltrates -5/4/22ABG 7.471/32/57/24 (0.8) -continuelasix 40 mg IV--increase to bid -05/15/20 Echo=EF 60-65%, no WMA, indeterminant diastolic -now down to [id number] [place]>>RA -plan to discharge on lasix 40 mg po daily  Chronic pain -d/c dilaudid due to drowsiness -5/8 at 0120--started on fentanyl due to n/v -5/8 afternoon--remains drowsy -5/8 AM RN reports patient tolerating diet without n/v -d/c fentanyl -restartedoxycodone>>cut to 1/2 dose  NAFLD/Liver Cirrhosis -05/13/20 CT abd as discussed above -05/14/20 paracentesis--WBC 51, only 20 cc removed -remains hypervolemic  Colonic Wall Thickening -?portal HTN colopathy -no abd pain, no diarrhea; tolerating diet  Gait instability/frequent falls/dizziness -PT evaluation-->SNF -CT brain negativeacute findings -No focal deficits on exam -5/3/22echo--EF 60-65%, no WMA  Complex partial seizure/PNES -Continue home dose of Keppra and Lamictal  Depression -Continue fluoxetine 80 mg daily -Continue as needed lorazepam  Meningioma -Patient follows Dr. [name] -s/p fractionated radiosurgery Sept 2020--Dr. [name] -01/18/20 MR brain--mass is stable in size as compared to the brain MRI of 09/13/2019 -no focal deficits on  exam  Hypokalemia -repleted  Transaminasemia -gradual rise due to hemodynamic changes (soft BP) in the setting of NAFLD and hepatic congestion (fluid overload) -05/14/20 RUQ US--Nodular contour to the liver suggesting cirrhosis.  Mild ascites. Mild sludge in otherwise normal appearing gallbladder. A small amount of fluid adjacent to the gallbladder is likely due to the ascites. -05/13/20 viral hepatitis serology--neg -trend labs daily  Restless Leg -previously discontinued mirapex due to dopamine agonist causing confusion -confusion improved -remain off mirapex      Status is: Inpatient  Remains inpatient appropriate because:IV treatments appropriate due to intensity of illness or inability to take PO   Dispo: The patient is from: Home  Anticipated d/c is to: SNF  Patient currently is not medically stable to d/c.              Difficult to place patient No        Family Communication:   Mother updated at bedside 5/9  Consultants:  none  Code Status:  FULL  DVT Prophylaxis:  [place] Heparin    Procedures: As Listed in Progress Note Above  Antibiotics: Cefazolin  5/2>>    Subjective: Patient slower to respond to questions.  Occasionally agitated.  Denies f/c, cp, sob, vomiting, diarrhea  Objective: Vitals:   05/22/20 1019 05/22/20 1118 05/22/20 1250 05/22/20 1308  BP:    111/72  Pulse:    98  Resp:    20  Temp: 99.4 F (37.4 C) 100 F (37.8 C) 99 F (37.2 C)   TempSrc: Oral Oral Oral   SpO2:    96%  Weight:      Height:        Intake/Output Summary (Last 24 hours) at 05/22/2020 1337 Last data filed at 05/22/2020 1311 Gross per 24 hour  Intake 641.51 ml  Output 200 ml  Net 441.51 ml   Weight change:  Exam:   General:  Pt is alert, follows commands appropriately, not in acute distress  HEENT: No icterus, No thrush, No neck mass, [place]/AT  Cardiovascular: RRR, S1/S2, no rubs, no  gallops  Respiratory: bibasilar crackles. No wheeze  Abdomen: Soft/+BS, non tender, non distended, no guarding  Extremities: trace LE edema, No lymphangitis, No petechiae, No rashes, no synovitis   Data Reviewed: I have personally reviewed following labs and imaging studies Basic Metabolic Panel: Recent Labs  Lab 05/17/20 0444 05/18/20 0504 05/19/20 0421 05/20/20 0459 05/22/20 0444  NA 136 135 135 137 144  K 3.6 2.9* 3.3* 3.5 5.1  CL 104 103 101 101 109  CO2 25 25 27 26 23   GLUCOSE 109* 97 105* 112* 89  BUN 10 12 11 12 17   CREATININE 0.45 0.44 0.47 0.48 0.80  CALCIUM 7.6* 7.5* 7.4* 7.7* 8.0*  MG 1.9 1.9 1.7 2.1 1.7   Liver Function Tests: Recent Labs  Lab 05/16/20 1620 05/17/20 0444 05/20/20 0459 05/22/20 0444  AST 109* 104* 111* 248*  ALT 43 41 36 59*  ALKPHOS 131* 138* 143* 167*  BILITOT 1.8* 1.6* 1.5* 2.4*  PROT 5.3* 5.1* 5.4* 5.5*  ALBUMIN 2.3* 2.3* 2.3* 2.5*   No results for input(s): LIPASE, AMYLASE in the last 168 hours. Recent Labs  Lab 05/22/20 1016  AMMONIA 64*   Coagulation Profile: Recent Labs  Lab 05/22/20 0841  INR 2.1*   CBC: Recent Labs  Lab 05/16/20 1620 05/17/20 0444 05/20/20 0459  WBC 11.2* 13.1* 8.6  HGB 10.0* 9.7* 9.5*  HCT 29.1* 28.9* 28.3*  MCV 89.3 89.8 88.7  PLT 197 219 235   Cardiac Enzymes: No results for input(s): CKTOTAL, CKMB, CKMBINDEX, TROPONINI in the last 168 hours. BNP: Invalid input(s): POCBNP CBG: No results for input(s): GLUCAP in the last 168  hours. HbA1C: No results for input(s): HGBA1C in the last 72 hours. Urine analysis:    Component Value Date/Time   COLORURINE YELLOW 05/21/2020 [place] 05/21/2020 1355   APPEARANCEUR Clear 01/30/2017 1020   LABSPEC 1.017 05/21/2020 1355   PHURINE 6.0 05/21/2020 1355   GLUCOSEU NEGATIVE 05/21/2020 1355   HGBUR MODERATE (A) 05/21/2020 1355   BILIRUBINUR NEGATIVE 05/21/2020 1355   BILIRUBINUR Negative 01/30/2017 [place]  05/21/2020 1355   PROTEINUR NEGATIVE 05/21/2020 1355   UROBILINOGEN negative 04/19/2014 1332   UROBILINOGEN 0.2 12/04/2013 1414   NITRITE NEGATIVE 05/21/2020 1355   LEUKOCYTESUR NEGATIVE 05/21/2020 1355   Sepsis Labs: @LABRCNTIP (procalcitonin:4,lacticidven:4) ) Recent Results (from the past 240 hour(s))  Culture, blood (routine x 2)     Status: Abnormal   Collection Time: 05/12/20 10:45 PM   Specimen: BLOOD LEFT FOREARM  Result Value Ref Range Status   Specimen Description   Final    BLOOD LEFT FOREARM Performed at [facility], [address] [place], [place] [address]    Special Requests   Final    Blood Culture results may not be optimal due to an excessive volume of blood received in culture bottles BOTTLES DRAWN AEROBIC AND ANAEROBIC Performed at [facility], [address] [place], [place] [address]    Culture  Setup Time   Final    GRAM POSITIVE COCCI BOTH ANAEROBIC AND AEROBIC BOTTLES Gram Stain Report Called to,Read Back By and Verified With: GANTT,E@1419  BY MATTHEWS, B 5.1.22 Organism ID to follow CRITICAL RESULT CALLED TO, READ BACK BY AND VERIFIED WITH: N,ONDARA RN @2130  05/13/20 AH  Performed at [facility], [place] [address] [place], [place] [address]    Culture STAPHYLOCOCCUS AUREUS (A)  Final   Report Status 05/15/2020 FINAL  Final   Organism ID, Bacteria STAPHYLOCOCCUS AUREUS  Final      Susceptibility   Staphylococcus aureus - MIC*    CIPROFLOXACIN >=8 RESISTANT Resistant     ERYTHROMYCIN >=8 RESISTANT Resistant     GENTAMICIN <=0.5 SENSITIVE Sensitive     OXACILLIN <=0.25 SENSITIVE Sensitive     TETRACYCLINE <=1 SENSITIVE Sensitive     VANCOMYCIN <=0.5 SENSITIVE Sensitive     TRIMETH/SULFA <=10 SENSITIVE Sensitive     CLINDAMYCIN <=0.25 SENSITIVE Sensitive     RIFAMPIN <=0.5 SENSITIVE Sensitive     Inducible Clindamycin NEGATIVE Sensitive     * STAPHYLOCOCCUS AUREUS  Blood Culture ID Panel (Reflexed)     Status: Abnormal   Collection Time:  05/12/20 10:45 PM  Result Value Ref Range Status   Enterococcus faecalis NOT DETECTED NOT DETECTED Final   Enterococcus Faecium NOT DETECTED NOT DETECTED Final   Listeria monocytogenes NOT DETECTED NOT DETECTED Final   Staphylococcus species DETECTED (A) NOT DETECTED Final    Comment: CRITICAL RESULT CALLED TO, READ BACK BY AND VERIFIED WITH: NACKSON ONDARA,RN AT 2132 05/13/2020 A.HUGHES    Staphylococcus aureus (BCID) DETECTED (A) NOT DETECTED Final    Comment: CRITICAL RESULT CALLED TO, READ BACK BY AND VERIFIED WITH: Valentino Hue, RN AT  2132 05/13/2020 A.HUGHES    Staphylococcus epidermidis NOT DETECTED NOT DETECTED Final   Staphylococcus lugdunensis NOT DETECTED NOT DETECTED Final   Streptococcus species NOT DETECTED NOT DETECTED Final   Streptococcus agalactiae NOT DETECTED NOT DETECTED Final   Streptococcus pneumoniae NOT DETECTED NOT DETECTED Final   Streptococcus pyogenes NOT DETECTED NOT DETECTED Final   A.calcoaceticus-baumannii NOT DETECTED NOT DETECTED Final   Bacteroides fragilis  NOT DETECTED NOT DETECTED Final   Enterobacterales NOT DETECTED NOT DETECTED Final   Enterobacter cloacae complex NOT DETECTED NOT DETECTED Final   Escherichia coli NOT DETECTED NOT DETECTED Final   Klebsiella aerogenes NOT DETECTED NOT DETECTED Final   Klebsiella oxytoca NOT DETECTED NOT DETECTED Final   Klebsiella pneumoniae NOT DETECTED NOT DETECTED Final   Proteus species NOT DETECTED NOT DETECTED Final   Salmonella species NOT DETECTED NOT DETECTED Final   Serratia marcescens NOT DETECTED NOT DETECTED Final   Haemophilus influenzae NOT DETECTED NOT DETECTED Final   Neisseria meningitidis NOT DETECTED NOT DETECTED Final   Pseudomonas aeruginosa NOT DETECTED NOT DETECTED Final   Stenotrophomonas maltophilia NOT DETECTED NOT DETECTED Final   Candida albicans NOT DETECTED NOT DETECTED Final   Candida auris NOT DETECTED NOT DETECTED Final   Candida glabrata NOT DETECTED NOT DETECTED Final    Candida krusei NOT DETECTED NOT DETECTED Final   Candida parapsilosis NOT DETECTED NOT DETECTED Final   Candida tropicalis NOT DETECTED NOT DETECTED Final   Cryptococcus neoformans/gattii NOT DETECTED NOT DETECTED Final   Meth resistant mecA/C and MREJ NOT DETECTED NOT DETECTED Final    Comment: Performed at [facility], [address] [place], [place] [address]  Resp Panel by RT-PCR (Flu A&B, Covid) Nasopharyngeal Swab     Status: None   Collection Time: 05/12/20 10:46 PM   Specimen: Nasopharyngeal Swab; Nasopharyngeal(NP) swabs in vial transport medium  Result Value Ref Range Status   SARS Coronavirus 2 by RT PCR NEGATIVE NEGATIVE Final    Comment: (NOTE) SARS-CoV-2 target nucleic acids are NOT DETECTED.  The SARS-CoV-2 RNA is generally detectable in upper respiratory specimens during the acute phase of infection. The lowest concentration of SARS-CoV-2 viral copies this assay can detect is 138 copies/mL. A negative result does not preclude SARS-Cov-2 infection and should not be used as the sole basis for treatment or other patient management decisions. A negative result may occur with  improper specimen collection/handling, submission of specimen other than nasopharyngeal swab, presence of viral mutation(s) within the areas targeted by this assay, and inadequate number of viral copies(<138 copies/mL). A negative result must be combined with clinical observations, patient history, and epidemiological information. The expected result is Negative.  Fact Sheet for Patients:  EntrepreneurPulse.com.au  Fact Sheet for Healthcare Providers:  IncredibleEmployment.be  This test is no t yet approved or cleared by the [place] FDA and  has been authorized for detection and/or diagnosis of SARS-CoV-2 by FDA under an Emergency Use Authorization (EUA). This EUA will remain  in effect (meaning this test can be used) for the duration of  the COVID-19 declaration under Section 564(b)(1) of the Act, 21 U.S.C.section 360bbb-3(b)(1), unless the authorization is terminated  or revoked sooner.       Influenza A by PCR NEGATIVE NEGATIVE Final   Influenza B by PCR NEGATIVE NEGATIVE Final    Comment: (NOTE) The Xpert Xpress SARS-CoV-2/FLU/RSV plus assay is intended as an aid in the diagnosis of influenza from Nasopharyngeal swab specimens and should not be used as a sole basis for treatment. Nasal washings and aspirates are unacceptable for Xpert Xpress SARS-CoV-2/FLU/RSV testing.  Fact Sheet for Patients: EntrepreneurPulse.com.au  Fact Sheet for Healthcare Providers: IncredibleEmployment.be  This test is not yet approved or cleared by the [place] FDA and has been authorized for detection and/or diagnosis of SARS-CoV-2 by FDA under an Emergency Use Authorization (EUA). This EUA will remain in effect (meaning this test can be  used) for the duration of the COVID-19 declaration under Section 564(b)(1) of the Act, 21 U.S.C. section 360bbb-3(b)(1), unless the authorization is terminated or revoked.  Performed at [facility], [address], [place], [place] [address]   Culture, blood (routine x 2)     Status: Abnormal   Collection Time: 05/12/20 11:10 PM   Specimen: BLOOD RIGHT HAND  Result Value Ref Range Status   Specimen Description   Final    BLOOD RIGHT HAND Performed at [facility], [address] [place], [place] [address]    Special Requests   Final    Blood Culture adequate volume BOTTLES DRAWN AEROBIC AND ANAEROBIC Performed at [facility], [address], [place], [place] [address]    Culture  Setup Time   Final    GRAM POSITIVE COCCI ANAEROBIC AND AEROBIC BOTTLES Gram Stain Report Called to,Read Back By and Verified With: GANTT,E@1419  BY MATTHEWS B 5.1.22 CRITICAL VALUE NOTED.  VALUE IS CONSISTENT WITH PREVIOUSLY REPORTED AND CALLED VALUE.    Culture (A)   Final    STAPHYLOCOCCUS AUREUS SUSCEPTIBILITIES PERFORMED ON PREVIOUS CULTURE WITHIN THE LAST 5 DAYS. Performed at [facility], [place] [address] [place], [place] [address]    Report Status 05/15/2020 FINAL  Final  Culture, Urine     Status: Abnormal   Collection Time: 05/13/20  1:41 AM   Specimen: Urine, Clean Catch  Result Value Ref Range Status   Specimen Description   Final    URINE, CLEAN CATCH Performed at [facility], [address], [place], [place] [address]    Special Requests   Final    NONE Performed at [facility], [address], [place], [place] [address]    Culture >=100,000 COLONIES/mL STAPHYLOCOCCUS AUREUS (A)  Final   Report Status 05/16/2020 FINAL  Final   Organism ID, Bacteria STAPHYLOCOCCUS AUREUS (A)  Final      Susceptibility   Staphylococcus aureus - MIC*    CIPROFLOXACIN >=8 RESISTANT Resistant     GENTAMICIN <=0.5 SENSITIVE Sensitive     NITROFURANTOIN <=16 SENSITIVE Sensitive     OXACILLIN <=0.25 SENSITIVE Sensitive     TETRACYCLINE <=1 SENSITIVE Sensitive     VANCOMYCIN <=0.5 SENSITIVE Sensitive     TRIMETH/SULFA <=10 SENSITIVE Sensitive     CLINDAMYCIN <=0.25 SENSITIVE Sensitive     RIFAMPIN <=0.5 SENSITIVE Sensitive     Inducible Clindamycin NEGATIVE Sensitive     * >=100,000 COLONIES/mL STAPHYLOCOCCUS AUREUS  Culture, blood (single)     Status: None   Collection Time: 05/14/20 10:08 AM   Specimen: BLOOD  Result Value Ref Range Status   Specimen Description BLOOD BLOOD LEFT HAND  Final   Special Requests   Final    Blood Culture results may not be optimal due to an inadequate volume of blood received in culture bottles BOTTLES DRAWN AEROBIC AND ANAEROBIC   Culture   Final    NO GROWTH 5 DAYS Performed at [facility], [address] [place], [place] [address]    Report Status 05/19/2020 FINAL  Final  Body fluid culture w Gram Stain     Status: None   Collection Time: 05/14/20 12:16 PM   Specimen: Ascitic; Body Fluid  Result Value  Ref Range Status   Specimen Description   Final    ASCITIC Performed at [facility], [address], [place], [place] [address]    Special Requests   Final    NONE Performed at [facility], [address] [place] [address],  [place], [place] [address]    Gram Stain NO WBC SEEN NO ORGANISMS SEEN   Final   Culture   Final    NO GROWTH 3 DAYS Performed at [facility], [place] [address] [place], [place] [address]    Report Status 05/17/2020 FINAL  Final  MRSA PCR Screening     Status: None   Collection Time: 05/16/20  2:57 PM   Specimen: Nasal Mucosa; Nasopharyngeal  Result Value Ref Range Status   MRSA by PCR NEGATIVE NEGATIVE Final    Comment:        The GeneXpert MRSA Assay (FDA approved for NASAL specimens only), is one component of a comprehensive MRSA colonization surveillance program. It is not intended to diagnose MRSA infection nor to guide or monitor treatment for MRSA infections. Performed at [facility], [address] [place],  [address]      Scheduled Meds: . Chlorhexidine Gluconate Cloth  6 each Topical Daily  . feeding supplement  237 mL Oral BID BM  . folic acid  1 mg Oral Daily  . [START ON 05/23/2020] furosemide  40 mg Oral Daily  . heparin  5,000 Units Subcutaneous Q8H  . lactobacillus  1 g Oral TID WC  . lactulose  20 g Oral TID  . lamoTRIgine  150 mg Oral BID  . levETIRAcetam  1,000 mg Oral BID  . metoprolol tartrate  12.5 mg Oral BID  . sodium chloride flush  10-40 mL Intracatheter Q12H   Continuous Infusions: .  ceFAZolin (ANCEF) IV 2 g (05/22/20 1029)    Procedures/Studies: CT HEAD WO CONTRAST  Result Date: 05/13/2020 CLINICAL DATA:  Fall 2 days prior EXAM: CT HEAD WITHOUT CONTRAST TECHNIQUE: Contiguous axial images were obtained from the base of the skull through the vertex without intravenous contrast. COMPARISON:  CT 02/20/2020 FINDINGS: Brain: No evidence of acute infarction, hemorrhage, hydrocephalus, extra-axial collection, visible mass  lesion or mass effect. Vascular: Atherosclerotic calcification of the carotid siphons. No hyperdense vessel. Skull: No calvarial fracture or suspicious osseous lesion. No scalp swelling or hematoma. Sinuses/Orbits: Paranasal sinuses are predominantly clear. Mastoid air cells appear chronically hypo pneumatized. Middle ear cavities are clear. Debris in the bilateral external auditory canals. Included orbital structures are unremarkable. Other: Asymmetric thickening and calcification in the left parotid gland is a chronic, nonspecific finding, similar to comparison study. IMPRESSION: No acute intracranial abnormality. No calvarial fracture, significant scalp swelling or hematoma. Intracranial atherosclerosis. Asymmetric thickening and calcification in the left parotid gland, a nonspecific, chronic finding unchanged from prior. Can reflect sequela of a chronic inflammatory process. Debris in the external auditory canals, correlate for cerumen impaction. Electronically Signed   By: Lovena Le M.D.   On: 05/13/2020 00:44   CT ABDOMEN PELVIS W CONTRAST  Result Date: 05/13/2020 CLINICAL DATA:  Abdominal pain, fever, right upper quadrant pain EXAM: CT ABDOMEN AND PELVIS WITH CONTRAST TECHNIQUE: Multidetector CT imaging of the abdomen and pelvis was performed using the standard protocol following bolus administration of intravenous contrast. CONTRAST:  133mL OMNIPAQUE IOHEXOL 300 MG/ML  SOLN COMPARISON:  CT 02/12/2020 FINDINGS: Lower chest: Basilar atelectatic changes in the lungs including more bandlike areas of subsegmental atelectasis or scarring. Normal heart size. No pericardial effusion. Hepatobiliary: Heterogeneous hepatic attenuation with a nodular liver surface contour concerning for intrinsic liver disease/cirrhosis. No concerning focal liver lesion. Moderate gallbladder distension. No significant gallbladder wall thickening or pericholecystic inflammation is seen. No visible calcified gallstones or biliary  ductal dilatation. Pancreas: Slightly edematous appearance of  the pancreatic parenchyma with very faint peripancreatic haze, uniform enhancement. No pancreatic ductal dilatation. Spleen: Mild splenomegaly.  No concerning focal splenic lesion. Adrenals/Urinary Tract: Normal adrenals. Kidneys enhance symmetrically. Symmetric and uniform excretion. No concerning focal renal lesion. Bilateral renal cortical scarring. Nonobstructing calculi present in both kidneys. No obstructive urolithiasis or hydronephrosis is seen at this time. Bladder is unremarkable for the degree of distention. Stomach/Bowel: Paraesophageal venous collaterals and varices. Some minimal collateralization and varices about the gastric fundus as well. Thickening towards the gastric antrum likely reflecting normal peristalsis. Air and fluid-filled appearance of the duodenum with a normal sweep across the midline abdomen. Slightly edematous appearance diffusely through the small bowel is nonspecific given additional findings above. Moderate right-sided colonic stool burden, particularly within the cecum. Some mild circumferential thickening and mucosal hyperemia about the cecum and ascending colon is nonspecific given hepatic features. No evidence of mechanical obstruction. Appendix is not well visualized. Vascular/Lymphatic: Atherosclerotic calcifications within the abdominal aorta and branch vessels. No aneurysm or ectasia. Paraesophageal and gastric varices with venous collaterals, as above. Splenorenal collateralization. Upper abdominal varices.No enlarged abdominopelvic lymph nodes. Few scattered calcified lymph nodes are present in the abdomen and pelvis. Reproductive: Portion the pelvis obscured by streak artifact. 2.6 cm cystic focus in the left adnexa, unchanged from comparison. No concerning right adnexal lesion. Prior hysterectomy. Other: Increasing edematous changes of the central mesentery. Small volume low-attenuation fluid in the deep  pelvis as well as trace perihepatic ascites. Circumferential body wall edema. No bowel containing hernia. No free abdominopelvic air. Musculoskeletal: Multilevel degenerative changes are present in the imaged portions of the spine. Grade 1 anterolisthesis L4 on L5 without spondylolysis, unchanged from prior. Prior left hip arthroplasty with chronic bony remodeling and a similar appearance overall to comparison prior. IMPRESSION: Constellation of features suggestive of intrinsic liver disease/cirrhosis with features of portal hypertension including a nodular, heterogeneous liver with paraesophageal and gastric varices, additional upper abdominal venous collateralization, splenorenal shunting and splenomegaly. Interval development of a small volume ascites as well as additional features of at least mild or early developing anasarca with circumferential body wall edema and central mesenteric edema. Some hazy peripancreatic stranding may be present, possibly related to a diffusely edematous appearance of the mesentery though could correlate with lipase. Mildly thickened appearance of the cecum and ascending colon as well as a diffusely edematous appearance of the small bowel is nonspecific in the setting of suspected intrinsic liver disease. Could reflect portal enteropathy/colopathy though should exclude clinical symptoms of enterocolitis. Bilateral nonobstructing nephrolithiasis. No obstructive urolithiasis or hydronephrosis at this time. Bilateral renal cortical scarring. Prior hysterectomy. Stable 2.7 cm left ovarian cyst. Continued stability favoring a benign process. No follow-up imaging recommended. Note: This recommendation does not apply to premenarchal patients and to those with increased risk (genetic, family history, elevated tumor markers or other high-risk factors) of ovarian cancer. Reference: JACR 2020 Feb; 17(2):248-254 Aortic Atherosclerosis (ICD10-I70.0). Electronically Signed   By: Lovena Le M.D.    On: 05/13/2020 00:35   US Abdomen Limited  Result Date: 05/13/2020 CLINICAL DATA:  Right upper quadrant pain. EXAM: ULTRASOUND ABDOMEN LIMITED RIGHT UPPER QUADRANT COMPARISON:  February 23, 2020 ultrasound.  CT scan May 12, 2020. FINDINGS: Gallbladder: A small amount of sludge is seen in the gallbladder. No stones, Murphy's sign, or wall thickening. A small amount of ascites is identified, including adjacent to the gallbladder. Common bile duct: Diameter: 5 mm Liver: There is a nodular contour in the liver. No focal mass. Portal vein is  patent on color Doppler imaging with normal direction of blood flow towards the liver. Other: Mild ascites. IMPRESSION: 1. Nodular contour to the liver suggesting cirrhosis.  Mild ascites. 2. Mild sludge in otherwise normal appearing gallbladder. A small amount of fluid adjacent to the gallbladder is likely due to the ascites. Electronically Signed   By: Dorise Bullion III M.D   On: 05/13/2020 09:06   US Paracentesis  Result Date: 05/14/2020 INDICATION: Fever, abdominal pain, question spontaneous bacterial peritonitis, ascites on CT EXAM: ULTRASOUND GUIDED DIAGNOSTIC PARACENTESIS MEDICATIONS: None COMPLICATIONS: None immediate PROCEDURE: Informed written consent was obtained from the patient after a discussion of the risks, benefits and alternatives to treatment. A timeout was performed prior to the initiation of the procedure. Initial ultrasound scanning demonstrates a small amount of ascites within the right lower abdominal quadrant. The right lower abdomen was prepped and draped in the usual sterile fashion. 1% lidocaine was used for local anesthesia. Following this, a 5 [place] Yueh catheter was introduced. An ultrasound image was saved for documentation purposes. The paracentesis was performed. The catheter was removed and a dressing was applied. The patient tolerated the procedure well without immediate post procedural complication. FINDINGS: A total of approximately  20 mL of clear yellow ascitic fluid was removed. Samples were sent to the laboratory as requested by the clinical team. IMPRESSION: Successful ultrasound-guided paracentesis yielding 20 mL of peritoneal fluid for diagnostic testing. Electronically Signed   By: Lavonia Dana M.D.   On: 05/14/2020 12:39   DG CHEST PORT 1 VIEW  Result Date: 05/20/2020 CLINICAL DATA:  Pulmonary edema EXAM: PORTABLE CHEST 1 VIEW COMPARISON:  05/16/2020 chest radiograph. FINDINGS: Right PICC terminates over the right atrium. Stable cardiomediastinal silhouette with normal heart size. No pneumothorax. No pleural effusion. Patchy hazy opacities throughout both lungs, significantly improved. IMPRESSION: Significantly improved patchy hazy opacities throughout both lungs, compatible with improving pulmonary edema. Electronically Signed   By: Ilona Sorrel M.D.   On: 05/20/2020 07:32   DG CHEST PORT 1 VIEW  Result Date: 05/16/2020 CLINICAL DATA:  Shortness of breath. EXAM: PORTABLE CHEST 1 VIEW COMPARISON:  Chest x-ray dated May 12, 2020. FINDINGS: The heart size and mediastinal contours are within normal limits. Prominent diffuse interstitial thickening with bilateral perihilar opacities. Small bilateral pleural effusions. No pneumothorax. No acute osseous abnormality. IMPRESSION: 1. Moderate to severe pulmonary edema with small bilateral pleural effusions. Electronically Signed   By: Titus Dubin M.D.   On: 05/16/2020 12:57   DG Chest Portable 1 View  Result Date: 05/12/2020 CLINICAL DATA:  Right sided rib pain. EXAM: PORTABLE CHEST 1 VIEW COMPARISON:  February 12, 2020 FINDINGS: A very mild amount of linear atelectasis is seen within the right lung base. There is no evidence of acute infiltrate, pleural effusion or pneumothorax. The heart size and mediastinal contours are within normal limits. A chronic seventh right rib fracture is seen. IMPRESSION: 1. Very mild right basilar linear atelectasis. 2. Chronic seventh right rib  fracture. Electronically Signed   By: Virgina Norfolk M.D.   On: 05/12/2020 23:16   US ABDOMEN COMPLETE W/ELASTOGRAPHY  Result Date: 05/03/2020 CLINICAL DATA:  Fatty liver EXAM: ULTRASOUND ABDOMEN ULTRASOUND HEPATIC ELASTOGRAPHY TECHNIQUE: Sonography of the upper abdomen was performed. In addition, ultrasound elastography evaluation of the liver was performed. A region of interest was placed within the right lobe of the liver. Following application of a compressive sonographic pulse, tissue compressibility was assessed. Multiple assessments were performed at the selected site. Median tissue compressibility was  determined. Previously, hepatic stiffness was assessed by shear wave velocity. Based on recently published Society of Radiologists in Ultrasound consensus article, reporting is now recommended to be performed in the SI units of pressure (kiloPascals) representing hepatic stiffness/elasticity. The obtained result is compared to the published reference standards. (cACLD = compensated Advanced Chronic Liver Disease) COMPARISON:  02/23/2020 FINDINGS: ULTRASOUND ABDOMEN Gallbladder: Gallbladder partially contracted without evidence of gallstones or sonographic Murphy sign. Small amount of internal sludge is present. No wall thickening. Common bile duct: Diameter: 6 mm upper normal Liver: Echogenic hepatic parenchyma with nodular contour consistent with cirrhosis. No discrete hepatic mass. Portal vein is patent on color Doppler imaging with normal direction of blood flow towards the liver. IVC: Normal appearance Pancreas: Normal appearance Spleen: 11.9 cm length, calculated volume 399 mL.  No focal mass. Right Kidney: Length: 11.9 cm. Normal morphology without mass or hydronephrosis. Left Kidney: Length: 12.0 cm. Normal morphology without mass or hydronephrosis. Abdominal aorta: Normal caliber Other findings: Scattered ascites. ULTRASOUND HEPATIC ELASTOGRAPHY Device: Siemens Helix VTQ Patient position: Not  recorded Transducer 5C1 Number of measurements: 10 Hepatic segment:  8 Median kPa: 95.8 IQR: 11.6 IQR/Median kPa ratio: 0.1 Data quality:  Good Diagnostic category: > or =17 kPa: highly suggestive of cACLD with an increased probability of clinically significant portal hypertension The use of hepatic elastography is applicable to patients with viral hepatitis and non-alcoholic fatty liver disease. At this time, there is insufficient data for the referenced cut-off values and use in other causes of liver disease, including alcoholic liver disease. Patients, however, may be assessed by elastography and serve as their own reference standard/baseline. In patients with non-alcoholic liver disease, the values suggesting compensated advanced chronic liver disease (cACLD) may be lower, and patients may need additional testing with elasticity results of 7-9 kPa. Please note that abnormal hepatic elasticity and shear wave velocities may also be identified in clinical settings other than with hepatic fibrosis, such as: acute hepatitis, elevated right heart and central venous pressures including use of beta blockers, veno-occlusive disease (Budd-Chiari), infiltrative processes such as mastocytosis/amyloidosis/infiltrative tumor/lymphoma, extrahepatic cholestasis, with hyperemia in the post-prandial state, and with liver transplantation. Correlation with patient history, laboratory data, and clinical condition recommended. Diagnostic Categories: < or =5 kPa: high probability of being normal < or =9 kPa: in the absence of other known clinical signs, rules out cACLD >9 kPa and ?13 kPa: suggestive of cACLD, but needs further testing >13 kPa: highly suggestive of cACLD > or =17 kPa: highly suggestive of cACLD with an increased probability of clinically significant portal hypertension IMPRESSION: ULTRASOUND ABDOMEN: Cirrhotic appearing liver without focal mass. ULTRASOUND HEPATIC ELASTOGRAPHY: Median kPa:  95.8 Diagnostic category: >  or =17 kPa: highly suggestive of cACLD with an increased probability of clinically significant portal hypertension Electronically Signed   By: Lavonia Dana M.D.   On: 05/03/2020 17:09   ECHOCARDIOGRAM COMPLETE  Result Date: 05/15/2020    ECHOCARDIOGRAM REPORT   Patient Name:   TALIESHA ACOSTA Date of Exam: 05/15/2020 Medical Rec #:  [id number]       Height:       60.0 in Accession #:    [id number]      Weight:       127.9 lb Date of Birth:  [birth year]        BSA:          1.543 m Patient Age:    12 years        BP:  88/64 mmHg Patient Gender: F               HR:           95 bpm. Exam Location:  [facility] Procedure: 2D Echo, Cardiac Doppler and Color Doppler Indications:    Bacteremia R78.81  History:        Patient has prior history of Echocardiogram examinations, most                 recent 02/23/2020. Risk Factors:Dyslipidemia. Cardiac Arrest,                 Elevated Liver Function Test.  Sonographer:    Alvino Chapel RCS Referring Phys: [id number] [place]  1. Left ventricular ejection fraction, by estimation, is 60 to 65%. The left ventricle has normal function. The left ventricle has no regional wall motion abnormalities. Left ventricular diastolic parameters are indeterminate.  2. Right ventricular systolic function is normal. The right ventricular size is normal.  3. Left atrial size was moderately dilated.  4. The mitral valve is normal in structure. No evidence of mitral valve regurgitation. No evidence of mitral stenosis.  5. The aortic valve has an indeterminant number of cusps. Aortic valve regurgitation is not visualized. No aortic stenosis is present.  6. The inferior vena cava is normal in size with greater than 50% respiratory variability, suggesting right atrial pressure of 3 mmHg. FINDINGS  Left Ventricle: Left ventricular ejection fraction, by estimation, is 60 to 65%. The left ventricle has normal function. The left ventricle has no regional wall motion abnormalities.  The left ventricular internal cavity size was normal in size. There is  no left ventricular hypertrophy. Left ventricular diastolic parameters are indeterminate. Right Ventricle: The right ventricular size is normal. No increase in right ventricular wall thickness. Right ventricular systolic function is normal. Left Atrium: Left atrial size was moderately dilated. Right Atrium: Right atrial size was not well visualized. Pericardium: There is no evidence of pericardial effusion. Mitral Valve: The mitral valve is normal in structure. No evidence of mitral valve regurgitation. No evidence of mitral valve stenosis. Tricuspid Valve: The tricuspid valve is normal in structure. Tricuspid valve regurgitation is not demonstrated. No evidence of tricuspid stenosis. Aortic Valve: The aortic valve has an indeterminant number of cusps. Aortic valve regurgitation is not visualized. No aortic stenosis is present. Aortic valve mean gradient measures 5.1 mmHg. Aortic valve peak gradient measures 8.2 mmHg. Aortic valve area, by VTI measures 2.13 cm. Pulmonic Valve: The pulmonic valve was not well visualized. Pulmonic valve regurgitation is not visualized. No evidence of pulmonic stenosis. Aorta: The aortic root is normal in size and structure. Pulmonary Artery: Inadequate TR jet, indeterminant PASP. Venous: The inferior vena cava is normal in size with greater than 50% respiratory variability, suggesting right atrial pressure of 3 mmHg. IAS/Shunts: No atrial level shunt detected by color flow Doppler.  LEFT VENTRICLE PLAX 2D LVIDd:         4.80 cm  Diastology LVIDs:         3.10 cm  LV e' medial:    10.60 cm/s LV PW:         1.00 cm  LV E/e' medial:  11.3 LV IVS:        1.00 cm  LV e' lateral:   9.14 cm/s LVOT diam:     1.80 cm  LV E/e' lateral: 13.1 LV SV:         64 LV SV Index:  41 LVOT Area:     2.54 cm  RIGHT VENTRICLE RV S prime:     16.30 cm/s TAPSE (M-mode): 2.6 cm LEFT ATRIUM             Index       RIGHT ATRIUM            Index LA diam:        4.20 cm 2.72 cm/m  RA Area:     14.20 cm LA Vol (A2C):   53.8 ml 34.86 ml/m RA Volume:   36.10 ml  23.39 ml/m LA Vol (A4C):   64.2 ml 41.60 ml/m LA Biplane Vol: 62.5 ml 40.49 ml/m  AORTIC VALVE AV Area (Vmax):    2.40 cm AV Area (Vmean):   2.00 cm AV Area (VTI):     2.13 cm AV Vmax:           143.34 cm/s AV Vmean:          109.425 cm/s AV VTI:            0.301 m AV Peak Grad:      8.2 mmHg AV Mean Grad:      5.1 mmHg LVOT Vmax:         135.00 cm/s LVOT Vmean:        86.200 cm/s LVOT VTI:          0.251 m LVOT/AV VTI ratio: 0.84  AORTA Ao Root diam: 3.00 cm MITRAL VALVE MV Area (PHT): 3.97 cm     SHUNTS MV Decel Time: 191 msec     Systemic VTI:  0.25 m MV E velocity: 120.00 cm/s  Systemic Diam: 1.80 cm MV A velocity: 87.30 cm/s MV E/A ratio:  1.37 Carlyle Dolly MD Electronically signed by Carlyle Dolly MD Signature Date/Time: 05/15/2020/2:16:14 PM    Final    ECHO TEE  Result Date: 05/16/2020    TRANSESOPHOGEAL ECHO REPORT   Patient Name:   [name] Date of Exam: 05/16/2020 Medical Rec #:  [id number]       Height:       60.0 in Accession #:    [id number]      Weight:       127.9 lb Date of Birth:  February 23, 1965        BSA:          1.543 m Patient Age:    89 years        BP:           123/85 mmHg Patient Gender: F               HR:           101 bpm. Exam Location:  [facility] Procedure: Transesophageal Echo, Cardiac Doppler and Color Doppler Indications:    Bacteremia  History:        Patient has prior history of Echocardiogram examinations, most                 recent 05/15/2020. Risk Factors:Dyslipidemia. Cardiac Arrest,                 Elevated Liver Function Test.  Sonographer:    Alvino Chapel RCS Referring Phys: [id number] [place]: The transesophogeal probe was passed without difficulty through the esophogus of the patient. Sedation performed by different physician. The patient developed no complications during the procedure. IMPRESSIONS  1. Left ventricular  ejection fraction, by estimation, is 60 to 65%. The left ventricle has  normal function.  2. Right ventricular systolic function is normal. The right ventricular size is normal.  3. Left atrial size was mildly dilated. No left atrial/left atrial appendage thrombus was detected. The LAA emptying velocity was 85 cm/s.  4. Right atrial size was mildly dilated.  5. The mitral valve is normal in structure. Trivial mitral valve regurgitation. No evidence of mitral stenosis.  6. The aortic valve is tricuspid. Aortic valve regurgitation is not visualized. No aortic stenosis is present. Conclusion(s)/Recommendation(s): No evidence of vegetation/infective endocarditis on this transesophageal echocardiogram. FINDINGS  Left Ventricle: Left ventricular ejection fraction, by estimation, is 60 to 65%. The left ventricle has normal function. The left ventricular internal cavity size was normal in size. Right Ventricle: The right ventricular size is normal. No increase in right ventricular wall thickness. Right ventricular systolic function is normal. Left Atrium: Left atrial size was mildly dilated. No left atrial/left atrial appendage thrombus was detected. The LAA emptying velocity was 85 cm/s. Right Atrium: Right atrial size was mildly dilated. Pericardium: There is no evidence of pericardial effusion. Mitral Valve: The mitral valve is normal in structure. Trivial mitral valve regurgitation. No evidence of mitral valve stenosis. Tricuspid Valve: The tricuspid valve is normal in structure. Tricuspid valve regurgitation is trivial. No evidence of tricuspid stenosis. Aortic Valve: The aortic valve is tricuspid. Aortic valve regurgitation is not visualized. No aortic stenosis is present. Pulmonic Valve: The pulmonic valve was normal in structure. Pulmonic valve regurgitation is not visualized. No evidence of pulmonic stenosis. Aorta: The aortic root is normal in size and structure. IAS/Shunts: No atrial level shunt detected by color  flow Doppler. Carlyle Dolly MD Electronically signed by Carlyle Dolly MD Signature Date/Time: 05/16/2020/1:28:24 PM    Final    [name] ASCITES (ABDOMEN LIMITED)  Result Date: 05/14/2020 CLINICAL DATA:  Ascites, question sufficient for paracentesis, clinical concern for spontaneous bacterial peritonitis EXAM: LIMITED ABDOMEN ULTRASOUND FOR ASCITES TECHNIQUE: Limited ultrasound survey for ascites was performed in all four abdominal quadrants. COMPARISON:  CT abdomen and pelvis 05/13/2020 FINDINGS: Small volume ascites identified in the lower quadrants bilaterally. Volume of ascites has probably slightly increased since prior CT. While the volume of fluid is insufficient for therapeutic paracentesis, a small volume of ascites may be obtainable for diagnostic purposes. Diagnostic paracentesis will be attempted. IMPRESSION: Small volume ascites, likely sufficient volume for diagnostic paracentesis. Electronically Signed   By: Lavonia Dana M.D.   On: 05/14/2020 11:11   [name] EKG SITE RITE  Result Date: 05/17/2020 If Site Rite image not attached, placement could not be confirmed due to current cardiac rhythm.   Orson Eva, DO  [facility]  If 7PM-7AM, please contact night-coverage [email or web address] Password [email or web address] 05/22/2020, 1:37 PM   LOS: 9 days

## 2020-05-22 NOTE — Progress Notes (Signed)
Responded to nursing call:  Fever 101.0   Subjective: Patient is pleasantly confused.  Denies headache, cp, sob, abd pain, diarrhea  Vitals:   05/22/20 1118 05/22/20 1250 05/22/20 1308 05/22/20 1458  BP:   111/72   Pulse:   98   Resp:   20   Temp: 100 F (37.8 C) 99 F (37.2 C)  (!) 101 F (38.3 C)  TempSrc: Oral Oral  Oral  SpO2:   96%   Weight:      Height:       CV--RRR Lung--bibasilar crackles. No wheeze Abd--soft+BS/NT Ext--no rashes, no synovitis   Assessment/Plan: New Fever -repeat blood culture -repeat CXR -05/21/20 UA--no pyuria -check PCT -niece and mother updated at bedside 5/10     Orson Eva, DO [facility]

## 2020-05-23 DIAGNOSIS — J9601 Acute respiratory failure with hypoxia: Secondary | ICD-10-CM | POA: Diagnosis not present

## 2020-05-23 DIAGNOSIS — Y92009 Unspecified place in unspecified non-institutional (private) residence as the place of occurrence of the external cause: Secondary | ICD-10-CM

## 2020-05-23 DIAGNOSIS — R188 Other ascites: Secondary | ICD-10-CM | POA: Diagnosis not present

## 2020-05-23 DIAGNOSIS — W19XXXA Unspecified fall, initial encounter: Secondary | ICD-10-CM

## 2020-05-23 DIAGNOSIS — R6510 Systemic inflammatory response syndrome (SIRS) of non-infectious origin without acute organ dysfunction: Secondary | ICD-10-CM | POA: Diagnosis not present

## 2020-05-23 DIAGNOSIS — A4101 Sepsis due to Methicillin susceptible Staphylococcus aureus: Secondary | ICD-10-CM | POA: Diagnosis not present

## 2020-05-23 LAB — COMPREHENSIVE METABOLIC PANEL
ALT: 68 U/L — ABNORMAL HIGH (ref 0–44)
AST: 310 U/L — ABNORMAL HIGH (ref 15–41)
Albumin: 2.3 g/dL — ABNORMAL LOW (ref 3.5–5.0)
Alkaline Phosphatase: 141 U/L — ABNORMAL HIGH (ref 38–126)
Anion gap: 9 (ref 5–15)
BUN: 19 mg/dL (ref 6–20)
CO2: 24 mmol/L (ref 22–32)
Calcium: 7.8 mg/dL — ABNORMAL LOW (ref 8.9–10.3)
Chloride: 111 mmol/L (ref 98–111)
Creatinine, Ser: 0.75 mg/dL (ref 0.44–1.00)
GFR, Estimated: >60 mL/min (ref >60)
Glucose, Bld: 123 mg/dL — ABNORMAL HIGH (ref 70–99)
Potassium: 3.2 mmol/L — ABNORMAL LOW (ref 3.5–5.1)
Sodium: 144 mmol/L (ref 135–145)
Total Bilirubin: 2.3 mg/dL — ABNORMAL HIGH (ref 0.3–1.2)
Total Protein: 4.9 g/dL — ABNORMAL LOW (ref 6.5–8.1)

## 2020-05-23 LAB — CBC
HCT: 22.8 % — ABNORMAL LOW (ref 36.0–46.0)
Hemoglobin: 7.8 g/dL — ABNORMAL LOW (ref 12.0–15.0)
MCH: 29.9 pg (ref 26.0–34.0)
MCHC: 34.2 g/dL (ref 30.0–36.0)
MCV: 87.4 fL (ref 80.0–100.0)
Platelets: 238 10*3/uL (ref 150–400)
RBC: 2.61 MIL/uL — ABNORMAL LOW (ref 3.87–5.11)
RDW: 17.1 % — ABNORMAL HIGH (ref 11.5–15.5)
WBC: 16.3 10*3/uL — ABNORMAL HIGH (ref 4.0–10.5)
nRBC: 0.2 % (ref 0.0–0.2)

## 2020-05-23 LAB — PROTIME-INR
INR: 2.5 — ABNORMAL HIGH (ref 0.8–1.2)
Prothrombin Time: 26.8 seconds — ABNORMAL HIGH (ref 11.4–15.2)

## 2020-05-23 LAB — URINE CULTURE: Culture: NO GROWTH

## 2020-05-23 LAB — PROCALCITONIN: Procalcitonin: 0.43 ng/mL

## 2020-05-23 MED ORDER — POTASSIUM CHLORIDE IN NACL 20-0.9 MEQ/L-% IV SOLN: INTRAVENOUS | Status: DC

## 2020-05-23 MED ORDER — LEVETIRACETAM IN NACL 1000 MG/100ML IV SOLN
1000.0000 mg | Freq: Once | INTRAVENOUS | Status: AC
  Administered 2020-05-23: 1000 mg via INTRAVENOUS
  Filled 2020-05-23: qty 100

## 2020-05-23 MED ORDER — POTASSIUM CHLORIDE CRYS ER 20 MEQ PO TBCR
40.0000 meq | EXTENDED_RELEASE_TABLET | ORAL | Status: AC
  Administered 2020-05-23 (×2): 40 meq via ORAL
  Filled 2020-05-23 (×2): qty 2

## 2020-05-23 MED ORDER — MAGNESIUM SULFATE 2 GM/50ML IV SOLN
2.0000 g | Freq: Once | INTRAVENOUS | Status: AC
  Administered 2020-05-23: 2 g via INTRAVENOUS
  Filled 2020-05-23: qty 50

## 2020-05-23 NOTE — Plan of Care (Signed)

## 2020-05-23 NOTE — Progress Notes (Signed)
Physical Therapy Treatment Patient Details Name: [name] MRN: [id number] DOB: 08-15-1965 Today's Date: 05/23/2020    History of Present Illness [name]  is a 55 y.o. female, with history of seizures, mood disorder, benign brain tumor, fatty liver, hypercholesterol, and more presents the ED with a chief complaint of fall.  Patient reports that she did prior to presentation.  She reports that her house is being remodeled and she tripped on something and fell forward hit her head on the railing and landed on her right chest.  She reports that she hit the back of her head on the way down.  She had no loss of consciousness.  She has had no change in vision or hearing.  She reports that prior to the fall she did have dizziness, chest pain, nausea, but these are chronic daily symptoms for her.  She did not have palpitations.  Her main complaint at this time is that the right side of her chest hurts.  It is worse with deep breaths and with position changes.  She tried Tylenol at home with no relief.  She does admit to having fever prior to coming to the ED today.  She reports a T-max at home of 103.  At presentation her temperature was 101.  She denies cough.  She admits to urinary frequency but no dysuria or urgency.  She has been constipated but did take a laxative yesterday with adequate response.  She reports she had a UTI a month ago and had antibiotics for it and the symptoms cleared up.  She has no rashes or open sores on her body.  She reports nausea and vomiting x3 today prior to coming in on the ambulance.  There is nonbloody emesis.  She has no other complaints at this time.    PT Comments    Pt demonstrating alert/disoriented disposition with fluctuating level of arousal appreciated.  Engaged in mobility from supine to sitting EOB requiring mod-max A throughout due to weakness and coordination difficulties with pt exhibiting pronounced extension tone requiring mod A to stabilize in  sitting at EOB.  Trial of sit to stand performed with HHA and again with notable extension thrust requiring therapist to lower pt to sitting EOB.  Pt repositioned to supine to participate in BLE AAROM requiring extensive physical cues to perform requisite motion with notable tone throughout ROM.  No adverse effects to mobility noted with pt not appearing in distress. Positioned to upright in bed with rails in place and bed alarm set. NSG staff present for report.   Follow Up Recommendations  SNF     Equipment Recommendations       Recommendations for Other Services       Precautions / Restrictions Precautions Precautions: Fall    Mobility  Bed Mobility Overal bed mobility: Needs Assistance Bed Mobility: Supine to Sit;Sit to Supine     Supine to sit: Mod assist;Max assist Sit to supine: Mod assist;Max assist   General bed mobility comments: increased time, labored movement, very stiff in trunk and LE's    Transfers Overall transfer level: Needs assistance Equipment used: 1 person hand held assist Transfers: Sit to/from Stand Sit to Stand: Mod assist;Max assist         General transfer comment: demo extension thrust/tone  Ambulation/Gait                 Chief Strategy Officer  Modified Rankin (Stroke Patients Only)       Balance Overall balance assessment: Needs assistance Sitting-balance support: Feet supported;No upper extremity supported Sitting balance-Leahy Scale: Poor Sitting balance - Comments: fair/poor seated at EOB   Standing balance support: During functional activity;Bilateral upper extremity supported Standing balance-Leahy Scale: Poor Standing balance comment: using RW                            Cognition Arousal/Alertness: Lethargic Behavior During Therapy: Restless Overall Cognitive Status: Impaired/Different from baseline                                 General Comments: Pt in  extension posture, clenched and stiff.  difficulty getting her to open her eyes      Exercises General Exercises - Lower Extremity Ankle Circles/Pumps: AAROM;Both;20 reps Heel Slides: AAROM;Both;20 reps Hip ABduction/ADduction: AAROM;Both;20 reps    General Comments        Pertinent Vitals/Pain      Home Living                      Prior Function            PT Goals (current goals can now be found in the care plan section) Acute Rehab PT Goals Patient Stated Goal: return home after rehab PT Goal Formulation: With patient Time For Goal Achievement: 06/01/20 Potential to Achieve Goals: Good Progress towards PT goals: Progressing toward goals    Frequency    Min 3X/week      PT Plan Current plan remains appropriate    Co-evaluation              AM-PAC PT "6 Clicks" Mobility   Outcome Measure  Help needed turning from your back to your side while in a flat bed without using bedrails?: A Lot Help needed moving from lying on your back to sitting on the side of a flat bed without using bedrails?: A Lot Help needed moving to and from a bed to a chair (including a wheelchair)?: A Lot Help needed standing up from a chair using your arms (e.g., wheelchair or bedside chair)?: A Lot Help needed to walk in hospital room?: A Lot Help needed climbing 3-5 steps with a railing? : Total 6 Click Score: 11    End of Session Equipment Utilized During Treatment: Gait belt Activity Tolerance: Treatment limited secondary to medical complications (Comment);Other (comment);Patient limited by lethargy Patient left: with call bell/phone within reach;in bed;with bed alarm set;with nursing/sitter in room Nurse Communication: Mobility status PT Visit Diagnosis: Unsteadiness on feet (R26.81);Other abnormalities of gait and mobility (R26.89);Muscle weakness (generalized) (M62.81)     Time: 3790-2409 PT Time Calculation (min) (ACUTE ONLY): 20 min  Charges:  $Therapeutic  Activity: 8-22 mins                    3:01 PM, 05/23/20 M. Sherlyn Lees, PT, DPT Physical Therapist- [place] Office Number: [phone]

## 2020-05-23 NOTE — TOC Progression Note (Signed)
Transition of Care [facility]) - Progression Note    Patient Details  Name: REESA GOTSCHALL MRN: [id number] Date of Birth: 1965-12-03  Transition of Care [facility]) CM/SW Contact  Salome Arnt, [place] Phone Number: 05/23/2020, 10:03 AM  Clinical Narrative:  SNF authorization still pending. Per MD, pt not medically stable at this time. TOC will continue to follow.      Expected Discharge Plan: Skilled Nursing Facility Barriers to Discharge: Continued Medical Work up,Insurance Authorization  Expected Discharge Plan and Services Expected Discharge Plan: [place]: RN,PT [facility] Agency: [place] Date [place]: 05/14/20 Time [place]: [id number] Representative spoke with at [place]: [place] Determinants of Health ([place]) Interventions    Readmission Risk Interventions Readmission Risk Prevention Plan 05/14/2020 02/24/2020 02/14/2020  Medication Screening - Complete Complete  Transportation Screening Complete Complete Complete  Home Care Screening Complete - -  Medication Review (RN CM) Complete - -  Some recent data might be hidden

## 2020-05-23 NOTE — Progress Notes (Signed)
PROGRESS NOTE  [name] [id number] DOB: June 15, 1965 DOA: 05/12/2020 PCP: [name], MD   Brief History:  55 year old female with a history of COPD, meningioma, cardiac arrest December 2020, hepatic steatosis,PNES, cognitive impairment, depression, complex partial seizure presentingwithgeneralized weakness and a fall.This has been a recurrent problem for the patient. She was admitted on 2/7 to 02/24/20 with a similar presentation. At that time she was treated for acute metabolic encephalopathy which was thought to be multifactorial including elevated ammonia, dehydration and dopamine agonist. She was discharged home with HHPT. She follow up with [facility], Dr. 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 She underwent liver elastography on 05/03/20 which suggested cACLD with increased probability of clinically significant portal HTN. She presented on 05/12/20 with another mechanical fall.She reports that her house is being remodeled and she tripped on something and fell forward hit her head on the railing and landed on her right chest. She reports that she hit the back of her head on the way down. She had no loss of consciousness. At presentation her temperature was 101.Blood cultures were done and she was found to have MSSA bacteremia. Also CT of the abd/pelvis on 05/13/20 showed signs of cirrhosis with portal HTN. She also had splenomegaly. THere was also a mildly thickened cecum and ascending colon with diffuse edematous appearance of the sm bowel. She underwent TEE on 05/16/20 after which she remained hypoxic on NRB.  TEE was negative for vegetation. She was then transferred to SDU for further care.She was started on IV lasix with clinical improvement.  Assessment/Plan: Severe Sepsis -present on admission -presented with fever, tachycardia, tachypnea, elevated lactate -due to MSSA bacteremia -Lactic peaked  2.2>>1.8 -UA 6-10 WBC -4/30CXR--no consolidation -Random cortisol level 17.1 -Continue supportive care and follow clinical response -WBCs up to 16,000; patient still spiking fever intermittently.  MSSA Bacteremia -05/12/20 blood culture--positive MSSA -05/14/20 blood cuture--neg to date -continue cefazolin -05/16/20 TEE--no vegetation -PICC line 5/6 if surveillance blood cultures remain neg -plan is to continue abx till 5/29  Hepatic Encephalopathy/Acute metabolic Encephalopathy -Over last 48 hours, pt more confused, slower to answer questions -multifactorial including opioids, hypnotic meds, elevated ammonia -UA--neg for pyuria -05/22/20 ammonia--64>>60 -Continue lactulose  Acute respiratory failure with hypoxia -oxygen saturation 88-90% on NRB in PACU 5/4 -personally reviewed CXR--bilateral interstitial infiltrates -5/4/22ABG 7.471/32/57/24 (0.8) -continuelasix 40 mg IV--increase to bid -05/15/20 Echo=EF 60-65%, no WMA, indeterminant diastolic -now down to [id number] [place]>>RA -plan to discharge on lasix 40 mg po daily -Continue to follow daily weights and strict I's and O's.  Chronic pain -d/c dilaudid due to drowsiness -5/8 at 0120--started on fentanyl due to n/v -5/8 afternoon--remains drowsy -5/8 AM RN reports patient tolerating diet without n/v -d/c fentanyl -restartedoxycodone>>cut to 1/2 dose;  Response.  NAFLD/Liver Cirrhosis -05/13/20 CT abd as discussed above -05/14/20 paracentesis--WBC 51, only 20 cc removed -remains mildly hypervolemic -Continue lactulose and intermittent follow ammonia level. -Mentation has improved.  Colonic Wall Thickening -?portal HTN colopathy -Tolerating diet -Mild vague discomfort reported after lactulose initiated -Now having bowel movements.  Gait instability/frequent falls/dizziness -PT evaluation-->SNF has been recommended. -CT brain negativeacute findings -No focal deficits on exam -5/3/22echo--EF 60-65%, no WMA -No  abnormalities appreciated on telemetry.  Complex partial seizure/PNES -Continue home dose of Keppra and Lamictal -No active seizure appreciated.  Depression -Continue fluoxetine 80 mg daily -Continue as needed lorazepam  Meningioma -Patient follows Dr. [name] -s/p  fractionated radiosurgery Sept 2020--Dr. Squire -01/18/20 MR brain--mass is stable in size as compared to the brain MRI of 09/13/2019 -no focal deficits on exam  Hypokalemia -repleted -Will continue to follow electrolytes intermittently.  Transaminasemia -gradual rise due to hemodynamic changes (soft BP) in the setting of NAFLD and hepatic congestion (fluid overload) -05/14/20 RUQ US--Nodular contour to the liver suggesting cirrhosis. Mild ascites. Mild sludge in otherwise normal appearing gallbladder. A small amount of fluid adjacent to the gallbladder is likely due to the ascites. -05/13/20 viral hepatitis serology--neg -Continue to follow LFTs trend   Restless Leg -previously discontinued mirapex due to dopamine agonist causing confusion -confusion improved -Continue holding Mirapex.    Status is: Inpatient  Remains inpatient appropriate because:IV treatments appropriate due to intensity of illness or inability to take PO   Dispo: The patient is from: Home  Anticipated d/c is to: SNF  Patient currently is not medically stable to d/c.              Difficult to place patient No    Family Communication:   no family at bedside.  Consultants:  none  Code Status:  FULL  DVT Prophylaxis:   Heparin    Procedures: As Listed in Progress Note Above  Antibiotics: Cefazolin  5/2>>    Subjective: Chest pain, no nausea vomiting.  Mild diffuse abdominal discomfort reported bowel movements delusional oriented x2 today.  Overnight spiking fever.  Objective: Vitals:   05/23/20 0321 05/23/20 0531 05/23/20 1215 05/23/20 1347  BP:  96/75  (!) 145/88  Pulse:  86   89  Resp:  19  18  Temp:  98.4 F (36.9 C) (!) 100.8 F (38.2 C) 99.4 F (37.4 C)  TempSrc:   Oral Oral  SpO2:  96%  97%  Weight: 65.5 kg     Height:        Intake/Output Summary (Last 24 hours) at 05/23/2020 1812 Last data filed at 05/23/2020 1700 Gross per 24 hour  Intake 200 ml  Output 100 ml  Net 100 ml   Weight change: 0.1 kg  Exam: General exam: Alert, awake, oriented x 2; still spiking fever overnight, complaining of mild abd discomfort and now having BM's after lactulose. Respiratory system: Positive scattered rhonchi; no using accessory muscles.  No wheezing on exam. Cardiovascular system: Rate controlled, no rubs, no gallops, no JVD. Gastrointestinal system: Abdomen is soft, nontender on palpation; patient reporting discomfort vaguely and diffusely.  Positive bowel sounds appreciated. Central nervous system: No focal neurological deficits. Extremities: No cyanosis or clubbing; trace edema appreciated bilaterally. Skin: No petechiae. Psychiatry: Judgement and insight appear normal. Mood & affect appropriate.    Data Reviewed: I have personally reviewed following labs and imaging studies  Basic Metabolic Panel: Recent Labs  Lab 05/17/20 0444 05/18/20 0504 05/19/20 0421 05/20/20 0459 05/22/20 0444 05/23/20 0436  NA 136 135 135 137 144 144  K 3.6 2.9* 3.3* 3.5 5.1 3.2*  CL 104 103 101 101 109 111  CO2 25 25 27 26 23 24   GLUCOSE 109* 97 105* 112* 89 123*  BUN 10 12 11 12 17 19   CREATININE 0.45 0.44 0.47 0.48 0.80 0.75  CALCIUM 7.6* 7.5* 7.4* 7.7* 8.0* 7.8*  MG 1.9 1.9 1.7 2.1 1.7  --    Liver Function Tests: Recent Labs  Lab 05/17/20 0444 05/20/20 0459 05/22/20 0444 05/23/20 0436  AST 104* 111* 248* 310*  ALT 41 36 59* 68*  ALKPHOS 138* 143* 167* 141*  BILITOT 1.6* 1.5*  2.4* 2.3*  PROT 5.1* 5.4* 5.5* 4.9*  ALBUMIN 2.3* 2.3* 2.5* 2.3*    Recent Labs  Lab 05/22/20 1016 05/22/20 1257  AMMONIA 64* 60*   Coagulation Profile: Recent Labs  Lab  05/22/20 0841 05/23/20 0436  INR 2.1* 2.5*   CBC: Recent Labs  Lab 05/17/20 0444 05/20/20 0459 05/23/20 0436  WBC 13.1* 8.6 16.3*  HGB 9.7* 9.5* 7.8*  HCT 28.9* 28.3* 22.8*  MCV 89.8 88.7 87.4  PLT 219 235 238   Urine analysis:    Component Value Date/Time   COLORURINE YELLOW 05/21/2020 1355   APPEARANCEUR CLEAR 05/21/2020 1355   APPEARANCEUR Clear 01/30/2017 1020   LABSPEC 1.017 05/21/2020 1355   PHURINE 6.0 05/21/2020 1355   GLUCOSEU NEGATIVE 05/21/2020 1355   HGBUR MODERATE (A) 05/21/2020 1355   BILIRUBINUR NEGATIVE 05/21/2020 1355   BILIRUBINUR Negative 01/30/2017 [place] 05/21/2020 1355   PROTEINUR NEGATIVE 05/21/2020 1355   UROBILINOGEN negative 04/19/2014 1332   UROBILINOGEN 0.2 12/04/2013 1414   NITRITE NEGATIVE 05/21/2020 1355   LEUKOCYTESUR NEGATIVE 05/21/2020 1355   Sepsis Labs:  Recent Results (from the past 240 hour(s))  Culture, blood (single)     Status: None   Collection Time: 05/14/20 10:08 AM   Specimen: BLOOD  Result Value Ref Range Status   Specimen Description BLOOD BLOOD LEFT HAND  Final   Special Requests   Final    Blood Culture results may not be optimal due to an inadequate volume of blood received in culture bottles BOTTLES DRAWN AEROBIC AND ANAEROBIC   Culture   Final    NO GROWTH 5 DAYS Performed at [facility], [address], [place], [place] [address]    Report Status 05/19/2020 FINAL  Final  Body fluid culture w Gram Stain     Status: None   Collection Time: 05/14/20 12:16 PM   Specimen: Ascitic; Body Fluid  Result Value Ref Range Status   Specimen Description   Final    ASCITIC Performed at [facility], [address], [place], [place] [address]    Special Requests   Final    NONE Performed at [facility], [address], [place], [place] [address]    Gram Stain NO WBC SEEN NO ORGANISMS SEEN   Final   Culture   Final    NO GROWTH 3 DAYS Performed at [facility], [place] [address],  [place], [place] [address]    Report Status 05/17/2020 FINAL  Final  MRSA PCR Screening     Status: None   Collection Time: 05/16/20  2:57 PM   Specimen: Nasal Mucosa; Nasopharyngeal  Result Value Ref Range Status   MRSA by PCR NEGATIVE NEGATIVE Final    Comment:        The GeneXpert MRSA Assay (FDA approved for NASAL specimens only), is one component of a comprehensive MRSA colonization surveillance program. It is not intended to diagnose MRSA infection nor to guide or monitor treatment for MRSA infections. Performed at [facility], [address], [place], [place] [address]   Culture, Urine     Status: None   Collection Time: 05/21/20  1:55 PM   Specimen: Urine, Random  Result Value Ref Range Status   Specimen Description   Final    URINE, RANDOM Performed at [facility], [address] [place], [place] [address]    Special Requests   Final    NONE Performed at [facility], [address] [place], [place] [address]  Culture   Final    NO GROWTH Performed at [facility], [place] [address], [place], [place] [address]    Report Status 05/23/2020 FINAL  Final  Culture, blood (Routine X 2) w Reflex to ID Panel     Status: None (Preliminary result)   Collection Time: 05/22/20  4:33 PM   Specimen: BLOOD RIGHT ARM  Result Value Ref Range Status   Specimen Description BLOOD RIGHT ARM  Final   Special Requests   Final    BOTTLES DRAWN AEROBIC AND ANAEROBIC Blood Culture results may not be optimal due to an excessive volume of blood received in culture bottles   Culture   Final    NO GROWTH < 12 HOURS Performed at [facility], [address], [place], [place] [address]    Report Status PENDING  Incomplete  Culture, blood (Routine X 2) w Reflex to ID Panel     Status: None (Preliminary result)   Collection Time: 05/22/20  4:33 PM   Specimen: BLOOD LEFT ARM  Result Value Ref Range Status   Specimen Description BLOOD LEFT ARM  Final   Special Requests   Final    BOTTLES  DRAWN AEROBIC AND ANAEROBIC Blood Culture adequate volume   Culture   Final    NO GROWTH < 12 HOURS Performed at [facility], [address], [place], [place] [address]    Report Status PENDING  Incomplete     Scheduled Meds: . Chlorhexidine Gluconate Cloth  6 each Topical Daily  . feeding supplement  237 mL Oral BID BM  . folic acid  1 mg Oral Daily  . furosemide  40 mg Oral Daily  . heparin  5,000 Units Subcutaneous Q8H  . lactobacillus  1 g Oral TID WC  . lactulose  20 g Oral TID  . lamoTRIgine  150 mg Oral BID  . levETIRAcetam  1,000 mg Oral BID  . metoprolol tartrate  12.5 mg Oral BID  . sodium chloride flush  10-40 mL Intracatheter Q12H   Continuous Infusions: .  ceFAZolin (ANCEF) IV 2 g (05/23/20 1629)    Procedures/Studies: CT HEAD WO CONTRAST  Result Date: 05/13/2020 CLINICAL DATA:  Fall 2 days prior EXAM: CT HEAD WITHOUT CONTRAST TECHNIQUE: Contiguous axial images were obtained from the base of the skull through the vertex without intravenous contrast. COMPARISON:  CT 02/20/2020 FINDINGS: Brain: No evidence of acute infarction, hemorrhage, hydrocephalus, extra-axial collection, visible mass lesion or mass effect. Vascular: Atherosclerotic calcification of the carotid siphons. No hyperdense vessel. Skull: No calvarial fracture or suspicious osseous lesion. No scalp swelling or hematoma. Sinuses/Orbits: Paranasal sinuses are predominantly clear. Mastoid air cells appear chronically hypo pneumatized. Middle ear cavities are clear. Debris in the bilateral external auditory canals. Included orbital structures are unremarkable. Other: Asymmetric thickening and calcification in the left parotid gland is a chronic, nonspecific finding, similar to comparison study. IMPRESSION: No acute intracranial abnormality. No calvarial fracture, significant scalp swelling or hematoma. Intracranial atherosclerosis. Asymmetric thickening and calcification in the left parotid gland, a nonspecific,  chronic finding unchanged from prior. Can reflect sequela of a chronic inflammatory process. Debris in the external auditory canals, correlate for cerumen impaction. Electronically Signed   By: Lovena Le M.D.   On: 05/13/2020 00:44   CT ABDOMEN PELVIS W CONTRAST  Result Date: 05/13/2020 CLINICAL DATA:  Abdominal pain, fever, right upper quadrant pain EXAM: CT ABDOMEN AND PELVIS WITH CONTRAST TECHNIQUE: Multidetector CT imaging of the abdomen and pelvis was performed using  the standard protocol following bolus administration of intravenous contrast. CONTRAST:  121mL OMNIPAQUE IOHEXOL 300 MG/ML  SOLN COMPARISON:  CT 02/12/2020 FINDINGS: Lower chest: Basilar atelectatic changes in the lungs including more bandlike areas of subsegmental atelectasis or scarring. Normal heart size. No pericardial effusion. Hepatobiliary: Heterogeneous hepatic attenuation with a nodular liver surface contour concerning for intrinsic liver disease/cirrhosis. No concerning focal liver lesion. Moderate gallbladder distension. No significant gallbladder wall thickening or pericholecystic inflammation is seen. No visible calcified gallstones or biliary ductal dilatation. Pancreas: Slightly edematous appearance of the pancreatic parenchyma with very faint peripancreatic haze, uniform enhancement. No pancreatic ductal dilatation. Spleen: Mild splenomegaly.  No concerning focal splenic lesion. Adrenals/Urinary Tract: Normal adrenals. Kidneys enhance symmetrically. Symmetric and uniform excretion. No concerning focal renal lesion. Bilateral renal cortical scarring. Nonobstructing calculi present in both kidneys. No obstructive urolithiasis or hydronephrosis is seen at this time. Bladder is unremarkable for the degree of distention. Stomach/Bowel: Paraesophageal venous collaterals and varices. Some minimal collateralization and varices about the gastric fundus as well. Thickening towards the gastric antrum likely reflecting normal peristalsis.  Air and fluid-filled appearance of the duodenum with a normal sweep across the midline abdomen. Slightly edematous appearance diffusely through the small bowel is nonspecific given additional findings above. Moderate right-sided colonic stool burden, particularly within the cecum. Some mild circumferential thickening and mucosal hyperemia about the cecum and ascending colon is nonspecific given hepatic features. No evidence of mechanical obstruction. Appendix is not well visualized. Vascular/Lymphatic: Atherosclerotic calcifications within the abdominal aorta and branch vessels. No aneurysm or ectasia. Paraesophageal and gastric varices with venous collaterals, as above. Splenorenal collateralization. Upper abdominal varices.No enlarged abdominopelvic lymph nodes. Few scattered calcified lymph nodes are present in the abdomen and pelvis. Reproductive: Portion the pelvis obscured by streak artifact. 2.6 cm cystic focus in the left adnexa, unchanged from comparison. No concerning right adnexal lesion. Prior hysterectomy. Other: Increasing edematous changes of the central mesentery. Small volume low-attenuation fluid in the deep pelvis as well as trace perihepatic ascites. Circumferential body wall edema. No bowel containing hernia. No free abdominopelvic air. Musculoskeletal: Multilevel degenerative changes are present in the imaged portions of the spine. Grade 1 anterolisthesis L4 on L5 without spondylolysis, unchanged from prior. Prior left hip arthroplasty with chronic bony remodeling and a similar appearance overall to comparison prior. IMPRESSION: Constellation of features suggestive of intrinsic liver disease/cirrhosis with features of portal hypertension including a nodular, heterogeneous liver with paraesophageal and gastric varices, additional upper abdominal venous collateralization, splenorenal shunting and splenomegaly. Interval development of a small volume ascites as well as additional features of at  least mild or early developing anasarca with circumferential body wall edema and central mesenteric edema. Some hazy peripancreatic stranding may be present, possibly related to a diffusely edematous appearance of the mesentery though could correlate with lipase. Mildly thickened appearance of the cecum and ascending colon as well as a diffusely edematous appearance of the small bowel is nonspecific in the setting of suspected intrinsic liver disease. Could reflect portal enteropathy/colopathy though should exclude clinical symptoms of enterocolitis. Bilateral nonobstructing nephrolithiasis. No obstructive urolithiasis or hydronephrosis at this time. Bilateral renal cortical scarring. Prior hysterectomy. Stable 2.7 cm left ovarian cyst. Continued stability favoring a benign process. No follow-up imaging recommended. Note: This recommendation does not apply to premenarchal patients and to those with increased risk (genetic, family history, elevated tumor markers or other high-risk factors) of ovarian cancer. Reference: JACR 2020 Feb; 17(2):248-254 Aortic Atherosclerosis (ICD10-I70.0). Electronically Signed   By: Elwin Sleight.D.  On: 05/13/2020 00:35   US Abdomen Limited  Result Date: 05/13/2020 CLINICAL DATA:  Right upper quadrant pain. EXAM: ULTRASOUND ABDOMEN LIMITED RIGHT UPPER QUADRANT COMPARISON:  February 23, 2020 ultrasound.  CT scan May 12, 2020. FINDINGS: Gallbladder: A small amount of sludge is seen in the gallbladder. No stones, Murphy's sign, or wall thickening. A small amount of ascites is identified, including adjacent to the gallbladder. Common bile duct: Diameter: 5 mm Liver: There is a nodular contour in the liver. No focal mass. Portal vein is patent on color Doppler imaging with normal direction of blood flow towards the liver. Other: Mild ascites. IMPRESSION: 1. Nodular contour to the liver suggesting cirrhosis.  Mild ascites. 2. Mild sludge in otherwise normal appearing gallbladder. A  small amount of fluid adjacent to the gallbladder is likely due to the ascites. Electronically Signed   By: Dorise Bullion III M.D   On: 05/13/2020 09:06   US Paracentesis  Result Date: 05/14/2020 INDICATION: Fever, abdominal pain, question spontaneous bacterial peritonitis, ascites on CT EXAM: ULTRASOUND GUIDED DIAGNOSTIC PARACENTESIS MEDICATIONS: None COMPLICATIONS: None immediate PROCEDURE: Informed written consent was obtained from the patient after a discussion of the risks, benefits and alternatives to treatment. A timeout was performed prior to the initiation of the procedure. Initial ultrasound scanning demonstrates a small amount of ascites within the right lower abdominal quadrant. The right lower abdomen was prepped and draped in the usual sterile fashion. 1% lidocaine was used for local anesthesia. Following this, a 5 [place] Yueh catheter was introduced. An ultrasound image was saved for documentation purposes. The paracentesis was performed. The catheter was removed and a dressing was applied. The patient tolerated the procedure well without immediate post procedural complication. FINDINGS: A total of approximately 20 mL of clear yellow ascitic fluid was removed. Samples were sent to the laboratory as requested by the clinical team. IMPRESSION: Successful ultrasound-guided paracentesis yielding 20 mL of peritoneal fluid for diagnostic testing. Electronically Signed   By: Lavonia Dana M.D.   On: 05/14/2020 12:39   DG CHEST PORT 1 VIEW  Result Date: 05/22/2020 CLINICAL DATA:  Cough EXAM: PORTABLE CHEST 1 VIEW COMPARISON:  Chest radiograph dated 05/20/2020. FINDINGS: The heart size and mediastinal contours are within normal limits. Moderate bilateral interstitial and airspace opacities appear unchanged since prior exam. A right upper extremity peripherally inserted central venous catheter tip overlies the superior cavoatrial junction. IMPRESSION: Moderate bilateral interstitial and airspace  opacities are not significantly changed and likely represent pulmonary edema. Electronically Signed   By: Zerita Boers M.D.   On: 05/22/2020 16:46   DG CHEST PORT 1 VIEW  Result Date: 05/20/2020 CLINICAL DATA:  Pulmonary edema EXAM: PORTABLE CHEST 1 VIEW COMPARISON:  05/16/2020 chest radiograph. FINDINGS: Right PICC terminates over the right atrium. Stable cardiomediastinal silhouette with normal heart size. No pneumothorax. No pleural effusion. Patchy hazy opacities throughout both lungs, significantly improved. IMPRESSION: Significantly improved patchy hazy opacities throughout both lungs, compatible with improving pulmonary edema. Electronically Signed   By: Ilona Sorrel M.D.   On: 05/20/2020 07:32   DG CHEST PORT 1 VIEW  Result Date: 05/16/2020 CLINICAL DATA:  Shortness of breath. EXAM: PORTABLE CHEST 1 VIEW COMPARISON:  Chest x-ray dated May 12, 2020. FINDINGS: The heart size and mediastinal contours are within normal limits. Prominent diffuse interstitial thickening with bilateral perihilar opacities. Small bilateral pleural effusions. No pneumothorax. No acute osseous abnormality. IMPRESSION: 1. Moderate to severe pulmonary edema with small bilateral pleural effusions. Electronically Signed   By: Gwyndolyn Saxon  Marzella Schlein M.D.   On: 05/16/2020 12:57   DG Chest Portable 1 View  Result Date: 05/12/2020 CLINICAL DATA:  Right sided rib pain. EXAM: PORTABLE CHEST 1 VIEW COMPARISON:  February 12, 2020 FINDINGS: A very mild amount of linear atelectasis is seen within the right lung base. There is no evidence of acute infiltrate, pleural effusion or pneumothorax. The heart size and mediastinal contours are within normal limits. A chronic seventh right rib fracture is seen. IMPRESSION: 1. Very mild right basilar linear atelectasis. 2. Chronic seventh right rib fracture. Electronically Signed   By: Virgina Norfolk M.D.   On: 05/12/2020 23:16   US ABDOMEN COMPLETE W/ELASTOGRAPHY  Result Date: 05/03/2020 CLINICAL  DATA:  Fatty liver EXAM: ULTRASOUND ABDOMEN ULTRASOUND HEPATIC ELASTOGRAPHY TECHNIQUE: Sonography of the upper abdomen was performed. In addition, ultrasound elastography evaluation of the liver was performed. A region of interest was placed within the right lobe of the liver. Following application of a compressive sonographic pulse, tissue compressibility was assessed. Multiple assessments were performed at the selected site. Median tissue compressibility was determined. Previously, hepatic stiffness was assessed by shear wave velocity. Based on recently published Society of Radiologists in Ultrasound consensus article, reporting is now recommended to be performed in the SI units of pressure (kiloPascals) representing hepatic stiffness/elasticity. The obtained result is compared to the published reference standards. (cACLD = compensated Advanced Chronic Liver Disease) COMPARISON:  02/23/2020 FINDINGS: ULTRASOUND ABDOMEN Gallbladder: Gallbladder partially contracted without evidence of gallstones or sonographic Murphy sign. Small amount of internal sludge is present. No wall thickening. Common bile duct: Diameter: 6 mm upper normal Liver: Echogenic hepatic parenchyma with nodular contour consistent with cirrhosis. No discrete hepatic mass. Portal vein is patent on color Doppler imaging with normal direction of blood flow towards the liver. IVC: Normal appearance Pancreas: Normal appearance Spleen: 11.9 cm length, calculated volume 399 mL.  No focal mass. Right Kidney: Length: 11.9 cm. Normal morphology without mass or hydronephrosis. Left Kidney: Length: 12.0 cm. Normal morphology without mass or hydronephrosis. Abdominal aorta: Normal caliber Other findings: Scattered ascites. ULTRASOUND HEPATIC ELASTOGRAPHY Device: Siemens Helix VTQ Patient position: Not recorded Transducer 5C1 Number of measurements: 10 Hepatic segment:  8 Median kPa: 95.8 IQR: 11.6 IQR/Median kPa ratio: 0.1 Data quality:  Good Diagnostic  category: > or =17 kPa: highly suggestive of cACLD with an increased probability of clinically significant portal hypertension The use of hepatic elastography is applicable to patients with viral hepatitis and non-alcoholic fatty liver disease. At this time, there is insufficient data for the referenced cut-off values and use in other causes of liver disease, including alcoholic liver disease. Patients, however, may be assessed by elastography and serve as their own reference standard/baseline. In patients with non-alcoholic liver disease, the values suggesting compensated advanced chronic liver disease (cACLD) may be lower, and patients may need additional testing with elasticity results of 7-9 kPa. Please note that abnormal hepatic elasticity and shear wave velocities may also be identified in clinical settings other than with hepatic fibrosis, such as: acute hepatitis, elevated right heart and central venous pressures including use of beta blockers, veno-occlusive disease (Budd-Chiari), infiltrative processes such as mastocytosis/amyloidosis/infiltrative tumor/lymphoma, extrahepatic cholestasis, with hyperemia in the post-prandial state, and with liver transplantation. Correlation with patient history, laboratory data, and clinical condition recommended. Diagnostic Categories: < or =5 kPa: high probability of being normal < or =9 kPa: in the absence of other known clinical signs, rules out cACLD >9 kPa and ?13 kPa: suggestive of cACLD, but needs further testing >  13 kPa: highly suggestive of cACLD > or =17 kPa: highly suggestive of cACLD with an increased probability of clinically significant portal hypertension IMPRESSION: ULTRASOUND ABDOMEN: Cirrhotic appearing liver without focal mass. ULTRASOUND HEPATIC ELASTOGRAPHY: Median kPa:  95.8 Diagnostic category: > or =17 kPa: highly suggestive of cACLD with an increased probability of clinically significant portal hypertension Electronically Signed   By: Lavonia Dana  M.D.   On: 05/03/2020 17:09   ECHOCARDIOGRAM COMPLETE  Result Date: 05/15/2020    ECHOCARDIOGRAM REPORT   Patient Name:   [name] Date of Exam: 05/15/2020 Medical Rec #:  [id number]       Height:       60.0 in Accession #:    [id number]      Weight:       127.9 lb Date of Birth:  13-Sep-1965        BSA:          1.543 m Patient Age:    73 years        BP:           88/64 mmHg Patient Gender: F               HR:           95 bpm. Exam Location:  [facility] Procedure: 2D Echo, Cardiac Doppler and Color Doppler Indications:    Bacteremia R78.81  History:        Patient has prior history of Echocardiogram examinations, most                 recent 02/23/2020. Risk Factors:Dyslipidemia. Cardiac Arrest,                 Elevated Liver Function Test.  Sonographer:    Alvino Chapel RCS Referring Phys: [id number] [place]  1. Left ventricular ejection fraction, by estimation, is 60 to 65%. The left ventricle has normal function. The left ventricle has no regional wall motion abnormalities. Left ventricular diastolic parameters are indeterminate.  2. Right ventricular systolic function is normal. The right ventricular size is normal.  3. Left atrial size was moderately dilated.  4. The mitral valve is normal in structure. No evidence of mitral valve regurgitation. No evidence of mitral stenosis.  5. The aortic valve has an indeterminant number of cusps. Aortic valve regurgitation is not visualized. No aortic stenosis is present.  6. The inferior vena cava is normal in size with greater than 50% respiratory variability, suggesting right atrial pressure of 3 mmHg. FINDINGS  Left Ventricle: Left ventricular ejection fraction, by estimation, is 60 to 65%. The left ventricle has normal function. The left ventricle has no regional wall motion abnormalities. The left ventricular internal cavity size was normal in size. There is  no left ventricular hypertrophy. Left ventricular diastolic parameters are  indeterminate. Right Ventricle: The right ventricular size is normal. No increase in right ventricular wall thickness. Right ventricular systolic function is normal. Left Atrium: Left atrial size was moderately dilated. Right Atrium: Right atrial size was not well visualized. Pericardium: There is no evidence of pericardial effusion. Mitral Valve: The mitral valve is normal in structure. No evidence of mitral valve regurgitation. No evidence of mitral valve stenosis. Tricuspid Valve: The tricuspid valve is normal in structure. Tricuspid valve regurgitation is not demonstrated. No evidence of tricuspid stenosis. Aortic Valve: The aortic valve has an indeterminant number of cusps. Aortic valve regurgitation is not visualized. No aortic stenosis is present. Aortic valve mean gradient measures  5.1 mmHg. Aortic valve peak gradient measures 8.2 mmHg. Aortic valve area, by VTI measures 2.13 cm. Pulmonic Valve: The pulmonic valve was not well visualized. Pulmonic valve regurgitation is not visualized. No evidence of pulmonic stenosis. Aorta: The aortic root is normal in size and structure. Pulmonary Artery: Inadequate TR jet, indeterminant PASP. Venous: The inferior vena cava is normal in size with greater than 50% respiratory variability, suggesting right atrial pressure of 3 mmHg. IAS/Shunts: No atrial level shunt detected by color flow Doppler.  LEFT VENTRICLE PLAX 2D LVIDd:         4.80 cm  Diastology LVIDs:         3.10 cm  LV e' medial:    10.60 cm/s LV PW:         1.00 cm  LV E/e' medial:  11.3 LV IVS:        1.00 cm  LV e' lateral:   9.14 cm/s LVOT diam:     1.80 cm  LV E/e' lateral: 13.1 LV SV:         64 LV SV Index:   41 LVOT Area:     2.54 cm  RIGHT VENTRICLE RV S prime:     16.30 cm/s TAPSE (M-mode): 2.6 cm LEFT ATRIUM             Index       RIGHT ATRIUM           Index LA diam:        4.20 cm 2.72 cm/m  RA Area:     14.20 cm LA Vol (A2C):   53.8 ml 34.86 ml/m RA Volume:   36.10 ml  23.39 ml/m LA Vol  (A4C):   64.2 ml 41.60 ml/m LA Biplane Vol: 62.5 ml 40.49 ml/m  AORTIC VALVE AV Area (Vmax):    2.40 cm AV Area (Vmean):   2.00 cm AV Area (VTI):     2.13 cm AV Vmax:           143.34 cm/s AV Vmean:          109.425 cm/s AV VTI:            0.301 m AV Peak Grad:      8.2 mmHg AV Mean Grad:      5.1 mmHg LVOT Vmax:         135.00 cm/s LVOT Vmean:        86.200 cm/s LVOT VTI:          0.251 m LVOT/AV VTI ratio: 0.84  AORTA Ao Root diam: 3.00 cm MITRAL VALVE MV Area (PHT): 3.97 cm     SHUNTS MV Decel Time: 191 msec     Systemic VTI:  0.25 m MV E velocity: 120.00 cm/s  Systemic Diam: 1.80 cm MV A velocity: 87.30 cm/s MV E/A ratio:  1.37 Carlyle Dolly MD Electronically signed by Carlyle Dolly MD Signature Date/Time: 05/15/2020/2:16:14 PM    Final    ECHO TEE  Result Date: 05/16/2020    TRANSESOPHOGEAL ECHO REPORT   Patient Name:   [name] Date of Exam: 05/16/2020 Medical Rec #:  [id number]       Height:       60.0 in Accession #:    [id number]      Weight:       127.9 lb Date of Birth:  [birth year]        BSA:          1.543 m Patient Age:  54 years        BP:           123/85 mmHg Patient Gender: F               HR:           101 bpm. Exam Location:  [facility] Procedure: Transesophageal Echo, Cardiac Doppler and Color Doppler Indications:    Bacteremia  History:        Patient has prior history of Echocardiogram examinations, most                 recent 05/15/2020. Risk Factors:Dyslipidemia. Cardiac Arrest,                 Elevated Liver Function Test.  Sonographer:    Alvino Chapel RCS Referring Phys: [id number] [place]: The transesophogeal probe was passed without difficulty through the esophogus of the patient. Sedation performed by different physician. The patient developed no complications during the procedure. IMPRESSIONS  1. Left ventricular ejection fraction, by estimation, is 60 to 65%. The left ventricle has normal function.  2. Right ventricular systolic function is normal. The  right ventricular size is normal.  3. Left atrial size was mildly dilated. No left atrial/left atrial appendage thrombus was detected. The LAA emptying velocity was 85 cm/s.  4. Right atrial size was mildly dilated.  5. The mitral valve is normal in structure. Trivial mitral valve regurgitation. No evidence of mitral stenosis.  6. The aortic valve is tricuspid. Aortic valve regurgitation is not visualized. No aortic stenosis is present. Conclusion(s)/Recommendation(s): No evidence of vegetation/infective endocarditis on this transesophageal echocardiogram. FINDINGS  Left Ventricle: Left ventricular ejection fraction, by estimation, is 60 to 65%. The left ventricle has normal function. The left ventricular internal cavity size was normal in size. Right Ventricle: The right ventricular size is normal. No increase in right ventricular wall thickness. Right ventricular systolic function is normal. Left Atrium: Left atrial size was mildly dilated. No left atrial/left atrial appendage thrombus was detected. The LAA emptying velocity was 85 cm/s. Right Atrium: Right atrial size was mildly dilated. Pericardium: There is no evidence of pericardial effusion. Mitral Valve: The mitral valve is normal in structure. Trivial mitral valve regurgitation. No evidence of mitral valve stenosis. Tricuspid Valve: The tricuspid valve is normal in structure. Tricuspid valve regurgitation is trivial. No evidence of tricuspid stenosis. Aortic Valve: The aortic valve is tricuspid. Aortic valve regurgitation is not visualized. No aortic stenosis is present. Pulmonic Valve: The pulmonic valve was normal in structure. Pulmonic valve regurgitation is not visualized. No evidence of pulmonic stenosis. Aorta: The aortic root is normal in size and structure. IAS/Shunts: No atrial level shunt detected by color flow Doppler. Carlyle Dolly MD Electronically signed by Carlyle Dolly MD Signature Date/Time: 05/16/2020/1:28:24 PM    Final    [name] ASCITES  (ABDOMEN LIMITED)  Result Date: 05/14/2020 CLINICAL DATA:  Ascites, question sufficient for paracentesis, clinical concern for spontaneous bacterial peritonitis EXAM: LIMITED ABDOMEN ULTRASOUND FOR ASCITES TECHNIQUE: Limited ultrasound survey for ascites was performed in all four abdominal quadrants. COMPARISON:  CT abdomen and pelvis 05/13/2020 FINDINGS: Small volume ascites identified in the lower quadrants bilaterally. Volume of ascites has probably slightly increased since prior CT. While the volume of fluid is insufficient for therapeutic paracentesis, a small volume of ascites may be obtainable for diagnostic purposes. Diagnostic paracentesis will be attempted. IMPRESSION: Small volume ascites, likely sufficient volume for diagnostic paracentesis. Electronically Signed   By: Lavonia Dana  M.D.   On: 05/14/2020 11:11   [name] EKG SITE RITE  Result Date: 05/17/2020 If Site Rite image not attached, placement could not be confirmed due to current cardiac rhythm.   Barton Dubois, MD  [facility]  If 7PM-7AM, please contact night-coverage [email or web address] Password [email or web address] 05/23/2020, 6:11 PM   LOS: 10 days

## 2020-05-23 NOTE — Progress Notes (Signed)
[facility] night shift.  The nursing staff reports that the patient is febrile with an axillary temperature of 102.7 F, HR 110 66 and blood pressure of 195/97 mmHg. she was admitted for MSSA bacteremia, past end-stage liver disease with hyperammonemia.  I have asked the staff to give her an acetaminophen suppository.  I have added a 0.9% NaCl plus KCl 20 mEq 125 mL/h over 8 hours.  The staff also requested to switch Keppra to IV form since they have been crushing her medications, but are unable to crush the levetiracetam tablets.  Keppra IV 1000 mg ordered.  Tennis Must, MD.

## 2020-05-24 ENCOUNTER — Inpatient Hospital Stay (HOSPITAL_COMMUNITY): Payer: Medicare HMO

## 2020-05-24 DIAGNOSIS — R6510 Systemic inflammatory response syndrome (SIRS) of non-infectious origin without acute organ dysfunction: Secondary | ICD-10-CM | POA: Diagnosis not present

## 2020-05-24 DIAGNOSIS — J9601 Acute respiratory failure with hypoxia: Secondary | ICD-10-CM | POA: Diagnosis not present

## 2020-05-24 DIAGNOSIS — R188 Other ascites: Secondary | ICD-10-CM | POA: Diagnosis not present

## 2020-05-24 DIAGNOSIS — A4101 Sepsis due to Methicillin susceptible Staphylococcus aureus: Secondary | ICD-10-CM | POA: Diagnosis not present

## 2020-05-24 LAB — CBC
HCT: 21.8 % — ABNORMAL LOW (ref 36.0–46.0)
Hemoglobin: 7.3 g/dL — ABNORMAL LOW (ref 12.0–15.0)
MCH: 30.3 pg (ref 26.0–34.0)
MCHC: 33.5 g/dL (ref 30.0–36.0)
MCV: 90.5 fL (ref 80.0–100.0)
Platelets: 249 10*3/uL (ref 150–400)
RBC: 2.41 MIL/uL — ABNORMAL LOW (ref 3.87–5.11)
RDW: 18.1 % — ABNORMAL HIGH (ref 11.5–15.5)
WBC: 18.6 10*3/uL — ABNORMAL HIGH (ref 4.0–10.5)
nRBC: 0.8 % — ABNORMAL HIGH (ref 0.0–0.2)

## 2020-05-24 LAB — BASIC METABOLIC PANEL
Anion gap: 10 (ref 5–15)
BUN: 21 mg/dL — ABNORMAL HIGH (ref 6–20)
CO2: 22 mmol/L (ref 22–32)
Calcium: 7.8 mg/dL — ABNORMAL LOW (ref 8.9–10.3)
Chloride: 115 mmol/L — ABNORMAL HIGH (ref 98–111)
Creatinine, Ser: 0.82 mg/dL (ref 0.44–1.00)
GFR, Estimated: >60 mL/min (ref >60)
Glucose, Bld: 127 mg/dL — ABNORMAL HIGH (ref 70–99)
Potassium: 4 mmol/L (ref 3.5–5.1)
Sodium: 147 mmol/L — ABNORMAL HIGH (ref 135–145)

## 2020-05-24 LAB — LAMOTRIGINE LEVEL: Lamotrigine Lvl: 17 ug/mL (ref 2.0–20.0)

## 2020-05-24 LAB — LACTIC ACID, PLASMA
Lactic Acid, Venous: 2.4 mmol/L (ref 0.5–1.9)
Lactic Acid, Venous: 2.3 mmol/L (ref 0.5–1.9)

## 2020-05-24 LAB — AMMONIA: Ammonia: 49 umol/L — ABNORMAL HIGH (ref 9–35)

## 2020-05-24 LAB — MAGNESIUM: Magnesium: 2.6 mg/dL — ABNORMAL HIGH (ref 1.7–2.4)

## 2020-05-24 MED ORDER — LEVETIRACETAM IN NACL 1000 MG/100ML IV SOLN: 1000.0000 mg | Freq: Two times a day (BID) | INTRAVENOUS | Status: DC

## 2020-05-24 MED ORDER — LACTATED RINGERS IV BOLUS
1000.0000 mL | Freq: Once | INTRAVENOUS | Status: AC
  Administered 2020-05-24: 1000 mL via INTRAVENOUS

## 2020-05-24 MED ORDER — HALOPERIDOL LACTATE 5 MG/ML IJ SOLN: 3.0000 mg | Freq: Once | INTRAMUSCULAR | Status: DC

## 2020-05-24 MED ORDER — SENNOSIDES-DOCUSATE SODIUM 8.6-50 MG PO TABS
1.0000 | ORAL_TABLET | Freq: Two times a day (BID) | ORAL | Status: DC
  Administered 2020-05-24 – 2020-05-27 (×5): 1 via ORAL
  Filled 2020-05-24 (×5): qty 1

## 2020-05-24 MED ORDER — LEVETIRACETAM 100 MG/ML PO SOLN
1000.0000 mg | Freq: Two times a day (BID) | ORAL | Status: DC
  Administered 2020-05-24 – 2020-05-27 (×7): 1000 mg via ORAL
  Filled 2020-05-24 (×9): qty 10

## 2020-05-24 MED ORDER — DEXTROSE 5 % IV SOLN: INTRAVENOUS | Status: AC

## 2020-05-24 MED ORDER — FLEET ENEMA 7-19 GM/118ML RE ENEM
1.0000 | ENEMA | Freq: Once | RECTAL | Status: AC
  Administered 2020-05-24: 1 via RECTAL

## 2020-05-24 NOTE — Progress Notes (Signed)
MD notified to assess patient due to restlessness and increase respirations. MD came to patients room and placed the following orders, CT of head, Chest x-ray and various labs.   Patients vitals were as followed b/p 110/67 HR 101 temp 99.6. And respirations of 23. Will continue to monitor closely.

## 2020-05-24 NOTE — Progress Notes (Addendum)
MD, and charge notified of patients MEWs score of yellow. MD ordered NS with KCI 20@125 . He also ordered Keppra IV due to patients inability to swallow whole pills. Also to monitor closely.   05/23/20 2119  Assess: MEWS Score  Temp (!) 102.7 F (39.3 C)  BP (!) 135/97  Pulse Rate (!) 110  Resp 20  SpO2 96 %  O2 Device Room Air  Assess: MEWS Score  MEWS Temp 2  MEWS Systolic 0  MEWS Pulse 1  MEWS RR 0  MEWS LOC 0  MEWS Score 3  MEWS Score Color Yellow  Assess: SIRS CRITERIA  SIRS Temperature  1  SIRS Pulse 1  SIRS Respirations  0  SIRS WBC 0  SIRS Score Sum  2

## 2020-05-24 NOTE — Progress Notes (Signed)
Occupational Therapy Treatment Patient Details Name: [name] MRN: [id number] DOB: 04/24/1965 Today's Date: 05/24/2020    History of present illness [name]  is a 55 y.o. female, with history of seizures, mood disorder, benign brain tumor, fatty liver, hypercholesterol, and more presents the ED with a chief complaint of fall.  Patient reports that she did prior to presentation.  She reports that her house is being remodeled and she tripped on something and fell forward hit her head on the railing and landed on her right chest.  She reports that she hit the back of her head on the way down.  She had no loss of consciousness.  She has had no change in vision or hearing.  She reports that prior to the fall she did have dizziness, chest pain, nausea, but these are chronic daily symptoms for her.  She did not have palpitations.  Her main complaint at this time is that the right side of her chest hurts.  It is worse with deep breaths and with position changes.  She tried Tylenol at home with no relief.  She does admit to having fever prior to coming to the ED today.  She reports a T-max at home of 103.  At presentation her temperature was 101.  She denies cough.  She admits to urinary frequency but no dysuria or urgency.  She has been constipated but did take a laxative yesterday with adequate response.  She reports she had a UTI a month ago and had antibiotics for it and the symptoms cleared up.  She has no rashes or open sores on her body.  She reports nausea and vomiting x3 today prior to coming in on the ambulance.  There is nonbloody emesis.  She has no other complaints at this time.   OT comments  Pt lethargic upon start of session with eyes closed often. Pt required moderate to maximal assist for bed mobility supine to sit and stand pivot transfer using RW. Pt required assist for upper 50% of ROM for shoulder flexion A/AROM exercise. Pt will continue to benefit from continued OT in the hospital  setting to address deficit areas.    Follow Up Recommendations  SNF    Equipment Recommendations  None recommended by OT          Precautions / Restrictions Precautions Precautions: Fall Restrictions Weight Bearing Restrictions: No       Mobility Bed Mobility Overal bed mobility: Needs Assistance Bed Mobility: Supine to Sit     Supine to sit: Mod assist;Max assist     General bed mobility comments: increased time, labored movement, very stiff in trunk and LE's    Transfers Overall transfer level: Needs assistance Equipment used: Rolling walker (2 wheeled) Transfers: Sit to/from [facility] Sit to Stand: Mod assist;Max assist Stand pivot transfers: Mod assist;Max assist       General transfer comment: extension tone    Balance Overall balance assessment: Needs assistance Sitting-balance support: Feet supported;No upper extremity supported Sitting balance-Leahy Scale: Poor Sitting balance - Comments: poor at EOB   Standing balance support: During functional activity;Bilateral upper extremity supported Standing balance-Leahy Scale: Poor Standing balance comment: using RW                           ADL either performed or assessed with clinical judgement   ADL Overall ADL's : Needs assistance/impaired  Toilet Transfer: Moderate assistance;Maximal assistance;RW [employer] forces technical officer Details (indicate cue type and reason): simulated via EOB to chair transfer                                   Cognition Arousal/Alertness: Lethargic Behavior During Therapy: Flat affect Overall Cognitive Status: Impaired/Different from baseline                                 General Comments: Pt eyes were closed often during treamtent. Noted extension posture when attempting to sit up at EOB and during the transfer.        Exercises Exercises: General Upper Extremity General Exercises - Upper  Extremity Shoulder Flexion: AAROM;10 reps;Seated (assisted need for upper 50% of ROM. [place] cuing throughout.)   Shoulder Instructions       General Comments      Pertinent Vitals/ Pain       Pain Assessment: No/denies pain (denied pain at start of session)                                                          Frequency  Min 2X/week        Progress Toward Goals  OT Goals(current goals can now be found in the care plan section)  Progress towards OT goals: Progressing toward goals  Acute Rehab OT Goals Patient Stated Goal: return home after rehab OT Goal Formulation: With patient Time For Goal Achievement: 06/01/20 Potential to Achieve Goals: Fair ADL Goals Pt Will Perform Grooming: standing;with min assist;with adaptive equipment Pt Will Perform Upper Body Dressing: with supervision;with adaptive equipment;sitting Pt Will Perform Lower Body Dressing: with mod assist;with min assist;with adaptive equipment;sitting/lateral leans Pt Will Transfer to Toilet: with min guard assist;with min assist;stand pivot transfer;grab bars Pt/caregiver will Perform Home Exercise Program: Increased ROM;Both right and left upper extremity;Increased strength  Plan Discharge plan remains appropriate                                    End of Session Equipment Utilized During Treatment: Rolling walker  OT Visit Diagnosis: Unsteadiness on feet (R26.81);History of falling (Z91.81);Other abnormalities of gait and mobility (R26.89);Muscle weakness (generalized) (M62.81);Adult, failure to thrive (R62.7)   Activity Tolerance Patient tolerated treatment well   Patient Left in chair;with call bell/phone within reach;with chair alarm set             Time: 2458-0998 OT Time Calculation (min): 15 min  Charges: OT General Charges $OT Visit: 1 Visit OT Treatments $Self Care/Home Management : 8-22 mins  Lorenia Hoston OT, MOT    [name] 05/24/2020, 11:31 AM

## 2020-05-24 NOTE — Progress Notes (Signed)
PROGRESS NOTE  [name] [id number] DOB: [birth year] DOA: 05/12/2020 PCP: [name], MD   Brief History:  55 year old female with a history of COPD, meningioma, cardiac arrest December 2020, hepatic steatosis,PNES, cognitive impairment, depression, complex partial seizure presentingwithgeneralized weakness and a fall.This has been a recurrent problem for the patient. She was admitted on 2/7 to 02/24/20 with a similar presentation. At that time she was treated for acute metabolic encephalopathy which was thought to be multifactorial including elevated ammonia, dehydration and dopamine agonist. She was discharged home with HHPT. She follow up with [facility], Dr. 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 She underwent liver elastography on 05/03/20 which suggested cACLD with increased probability of clinically significant portal HTN. She presented on 05/12/20 with another mechanical fall.She reports that her house is being remodeled and she tripped on something and fell forward hit her head on the railing and landed on her right chest. She reports that she hit the back of her head on the way down. She had no loss of consciousness. At presentation her temperature was 101.Blood cultures were done and she was found to have MSSA bacteremia. Also CT of the abd/pelvis on 05/13/20 showed signs of cirrhosis with portal HTN. She also had splenomegaly. THere was also a mildly thickened cecum and ascending colon with diffuse edematous appearance of the sm bowel. She underwent TEE on 05/16/20 after which she remained hypoxic on NRB.  TEE was negative for vegetation. She was then transferred to SDU for further care.She was started on IV lasix with clinical improvement.  Assessment/Plan: Severe Sepsis -present on admission -presented with fever, tachycardia, tachypnea, elevated lactate -due to MSSA bacteremia -Lactic peaked  2.2>>1.8 -UA 6-10 WBC -4/30CXR--no consolidation -Random cortisol level 17.1 -Continue supportive care and follow clinical response -WBCs up to 18,000; patient still spiking fever intermittently.  Mild hypernatremia and elevated lactic acid -will provide genlt IVF resuscitation -follow clinical response and ability to maintain oral intake.  MSSA Bacteremia -05/12/20 blood culture--positive MSSA -05/14/20 blood cuture--neg to date -continue cefazolin -05/16/20 TEE--no vegetation -PICC line 5/6 if surveillance blood cultures remain neg -plan is to continue abx till 5/29  Hepatic Encephalopathy/Acute metabolic Encephalopathy -Over last 48 hours, pt more confused, slower to answer questions -multifactorial including opioids, hypnotic meds, elevated ammonia -UA--neg for pyuria -05/22/20 ammonia--64>>60 -Continue lactulose  Acute respiratory failure with hypoxia -oxygen saturation 88-90% on NRB in PACU 5/4 -personally reviewed CXR--bilateral interstitial infiltrates -5/4/22ABG 7.471/32/57/24 (0.8) -continuelasix 40 mg IV--increase to bid -05/15/20 Echo=EF 60-65%, no WMA, indeterminant diastolic -now down to [id number] [place]>>RA -plan to discharge on lasix 40 mg po daily -Continue to follow daily weights and strict I's and O's.  Chronic pain -d/c dilaudid due to drowsiness -5/8 at 0120--started on fentanyl due to n/v -5/8 afternoon--remains drowsy -5/8 AM RN reports patient tolerating diet without n/v -d/c fentanyl -restartedoxycodone>>cut to 1/2 dose; follow clinical response.  NAFLD/Liver Cirrhosis -05/13/20 CT abd as discussed above -05/14/20 paracentesis--WBC 51, only 20 cc removed -remains mildly hypervolemic -Continue lactulose at current dose. -Mentation continues slowly but steadily to improved -Will continue to follow intermittently ammonia level  Colonic Wall Thickening -?portal HTN colopathy -Tolerating diet -Mild vague discomfort reported after lactulose  initiated -will add bowel regimen.  Gait instability/frequent falls/dizziness -PT evaluation-->SNF has been recommended. -CT brain negativeacute findings -No focal deficits on exam -5/3/22echo--EF 60-65%, no WMA -No abnormalities appreciated on telemetry.  Complex  partial seizure/PNES -Continue home dose of Keppra and Lamictal -No active seizure appreciated currently.  Depression -Continue fluoxetine 80 mg daily -Continue as needed lorazepam  Meningioma -Patient follows Dr. [name] -s/p fractionated radiosurgery Sept 2020--Dr. [name] -01/18/20 MR brain--mass is stable in size as compared to the brain MRI of 09/13/2019 -no focal deficits on exam  Hypokalemia -Continue as needed repletion -Will continue to follow electrolytes intermittently.  Transaminasemia -gradual rise due to hemodynamic changes (soft BP) in the setting of NAFLD and hepatic congestion (fluid overload) -05/14/20 RUQ US--Nodular contour to the liver suggesting cirrhosis. Mild ascites. Mild sludge in otherwise normal appearing gallbladder. A small amount of fluid adjacent to the gallbladder is likely due to the ascites. -05/13/20 viral hepatitis serology--neg -Continue to follow LFTs trend   Restless Leg -previously discontinued mirapex due to dopamine agonist causing confusion -Overall confusion improved -No complaining of urinary cramps. -Continue holding Mirapex.   Status is: Inpatient  Remains inpatient appropriate because:IV treatments appropriate due to intensity of illness or inability to take PO   Dispo: The patient is from: Home  Anticipated d/c is to: SNF  Patient currently is not medically stable to d/c.              Difficult to place patient No    Family Communication:   no family at bedside.  Consultants:  none  Code Status:  FULL  DVT Prophylaxis:  [place] Heparin    Procedures: As Listed in Progress Note Above  Antibiotics: Cefazolin   5/2>>    Subjective: Spiked fever overnight; no chest pain, no nausea, no vomiting; oriented x2, more interactive and following commands.  Improvement appreciated in her ammonia level.  Hyponatremic, elevated lactic acid, still expressing intermittent abdominal discomfort.  No significant BM achieved yet.  Objective: Vitals:   05/24/20 0342 05/24/20 0543 05/24/20 0826 05/24/20 1238  BP:  (!) 114/58 (!) 96/59 102/65  Pulse:  95 96 93  Resp: 20 18  17   Temp:  (!) 97.5 F (36.4 C)  97.8 F (36.6 C)  TempSrc:  Oral  Oral  SpO2:  98%  98%  Weight:      Height:        Intake/Output Summary (Last 24 hours) at 05/24/2020 1611 Last data filed at 05/24/2020 1500 Gross per 24 hour  Intake 1256.04 ml  Output 500 ml  Net 756.04 ml   Weight change: 0.1 kg  Exam: General exam: Alert, awake, oriented x 2; following commands and overall demonstrating improvement in mentation.  Still without significant Movements Expressing Intermittent Discomfort.  Spiked Fever Overnight.   Respiratory system: Positive scattered rhonchi, no wheezing, no crackles. Respiratory effort normal.  Good oxygen saturation on room air. Cardiovascular system: Rate controlled, no rubs, no gallops, no JVD continue Gastrointestinal system: Abdomen is soft, mildly distended, no guarding, diffuse/vague tenderness on palpation.  Positive bowel sounds.  Central nervous system: Generalized weakness; no focal neurological deficits. Extremities: No cyanosis or clubbing.  Left posterior lateral thigh with extensive bruise/hematoma.  No open wounds appreciated. Skin: No petechiae. Psychiatry: Mood & affect appropriate.   Data Reviewed: I have personally reviewed following labs and imaging studies  Basic Metabolic Panel: Recent Labs  Lab 05/18/20 0504 05/19/20 0421 05/20/20 0459 05/22/20 0444 05/23/20 0436 05/24/20 0108  NA 135 135 137 144 144 147*  K 2.9* 3.3* 3.5 5.1 3.2* 4.0  CL 103 101 101 109 111 115*  CO2 25 27  26 23 24 22   GLUCOSE 97 105* 112* 89 123*  127*  BUN 12 11 12 17 19  21*  CREATININE 0.44 0.47 0.48 0.80 0.75 0.82  CALCIUM 7.5* 7.4* 7.7* 8.0* 7.8* 7.8*  MG 1.9 1.7 2.1 1.7  --  2.6*   Liver Function Tests: Recent Labs  Lab 05/20/20 0459 05/22/20 0444 05/23/20 0436  AST 111* 248* 310*  ALT 36 59* 68*  ALKPHOS 143* 167* 141*  BILITOT 1.5* 2.4* 2.3*  PROT 5.4* 5.5* 4.9*  ALBUMIN 2.3* 2.5* 2.3*    Recent Labs  Lab 05/22/20 1016 05/22/20 1257 05/24/20 0108  AMMONIA 64* 60* 49*   Coagulation Profile: Recent Labs  Lab 05/22/20 0841 05/23/20 0436  INR 2.1* 2.5*   CBC: Recent Labs  Lab 05/20/20 0459 05/23/20 0436 05/24/20 0108  WBC 8.6 16.3* 18.6*  HGB 9.5* 7.8* 7.3*  HCT 28.3* 22.8* 21.8*  MCV 88.7 87.4 90.5  PLT 235 238 249   Urine analysis:    Component Value Date/Time   COLORURINE YELLOW 05/21/2020 1355   APPEARANCEUR CLEAR 05/21/2020 1355   APPEARANCEUR Clear 01/30/2017 1020   LABSPEC 1.017 05/21/2020 1355   PHURINE 6.0 05/21/2020 1355   GLUCOSEU NEGATIVE 05/21/2020 1355   HGBUR MODERATE (A) 05/21/2020 1355   BILIRUBINUR NEGATIVE 05/21/2020 1355   BILIRUBINUR Negative 01/30/2017 [place] 05/21/2020 1355   PROTEINUR NEGATIVE 05/21/2020 1355   UROBILINOGEN negative 04/19/2014 1332   UROBILINOGEN 0.2 12/04/2013 1414   NITRITE NEGATIVE 05/21/2020 1355   LEUKOCYTESUR NEGATIVE 05/21/2020 1355   Sepsis Labs:  Recent Results (from the past 240 hour(s))  MRSA PCR Screening     Status: None   Collection Time: 05/16/20  2:57 PM   Specimen: Nasal Mucosa; Nasopharyngeal  Result Value Ref Range Status   MRSA by PCR NEGATIVE NEGATIVE Final    Comment:        The GeneXpert MRSA Assay (FDA approved for NASAL specimens only), is one component of a comprehensive MRSA colonization surveillance program. It is not intended to diagnose MRSA infection nor to guide or monitor treatment for MRSA infections. Performed at [facility], [address], [place], [place] [address]   Culture, Urine     Status: None   Collection Time: 05/21/20  1:55 PM   Specimen: Urine, Random  Result Value Ref Range Status   Specimen Description   Final    URINE, RANDOM Performed at [facility], [address] [place], [place] [address]    Special Requests   Final    NONE Performed at [facility], [address] [place], [place] [address]    Culture   Final    NO GROWTH Performed at [facility], [place] [address] [place], [place] [address]    Report Status 05/23/2020 FINAL  Final  Culture, blood (Routine X 2) w Reflex to ID Panel     Status: None (Preliminary result)   Collection Time: 05/22/20  4:33 PM   Specimen: BLOOD RIGHT ARM  Result Value Ref Range Status   Specimen Description BLOOD RIGHT ARM  Final   Special Requests   Final    BOTTLES DRAWN AEROBIC AND ANAEROBIC Blood Culture results may not be optimal due to an excessive volume of blood received in culture bottles   Culture   Final    NO GROWTH 2 DAYS Performed at [facility], [address] [place], [place] [address]    Report Status PENDING  Incomplete  Culture, blood (Routine X 2) w Reflex to ID Panel  Status: None (Preliminary result)   Collection Time: 05/22/20  4:33 PM   Specimen: BLOOD LEFT ARM  Result Value Ref Range Status   Specimen Description BLOOD LEFT ARM  Final   Special Requests   Final    BOTTLES DRAWN AEROBIC AND ANAEROBIC Blood Culture adequate volume   Culture   Final    NO GROWTH 2 DAYS Performed at [facility], [address] [place], [place] [address]    Report Status PENDING  Incomplete     Scheduled Meds: . Chlorhexidine Gluconate Cloth  6 each Topical Daily  . feeding supplement  237 mL Oral BID BM  . folic acid  1 mg Oral Daily  . furosemide  40 mg Oral Daily  . haloperidol lactate  3 mg Intravenous Once  . heparin  5,000 Units Subcutaneous Q8H  . lactobacillus  1 g Oral TID WC  . lactulose  20 g Oral TID  . lamoTRIgine  150 mg  Oral BID  . levETIRAcetam  1,000 mg Oral BID  . metoprolol tartrate  12.5 mg Oral BID  . senna-docusate  1 tablet Oral BID  . sodium chloride flush  10-40 mL Intracatheter Q12H  . sodium phosphate  1 enema Rectal Once   Continuous Infusions: .  ceFAZolin (ANCEF) IV 2 g (05/24/20 1608)    Procedures/Studies: CT HEAD WO CONTRAST  Result Date: 05/24/2020 CLINICAL DATA:  55 year old female with altered mental status. EXAM: CT HEAD WITHOUT CONTRAST TECHNIQUE: Contiguous axial images were obtained from the base of the skull through the vertex without intravenous contrast. COMPARISON:  Head CT dated 05/12/2020. FINDINGS: Evaluation of this exam is limited due to motion artifact. Brain: The ventricles and sulci appropriate size for patient's age. The gray-white matter discrimination is preserved. There is no acute intracranial hemorrhage. No mass effect or midline shift. No extra-axial fluid collection. Vascular: No hyperdense vessel or unexpected calcification. Skull: Normal. Negative for fracture or focal lesion. Sinuses/Orbits: No acute finding. Other: Mild left forehead contusion. IMPRESSION: No acute intracranial pathology. Electronically Signed   By: Anner Crete M.D.   On: 05/24/2020 02:17   CT HEAD WO CONTRAST  Result Date: 05/13/2020 CLINICAL DATA:  Fall 2 days prior EXAM: CT HEAD WITHOUT CONTRAST TECHNIQUE: Contiguous axial images were obtained from the base of the skull through the vertex without intravenous contrast. COMPARISON:  CT 02/20/2020 FINDINGS: Brain: No evidence of acute infarction, hemorrhage, hydrocephalus, extra-axial collection, visible mass lesion or mass effect. Vascular: Atherosclerotic calcification of the carotid siphons. No hyperdense vessel. Skull: No calvarial fracture or suspicious osseous lesion. No scalp swelling or hematoma. Sinuses/Orbits: Paranasal sinuses are predominantly clear. Mastoid air cells appear chronically hypo pneumatized. Middle ear cavities are  clear. Debris in the bilateral external auditory canals. Included orbital structures are unremarkable. Other: Asymmetric thickening and calcification in the left parotid gland is a chronic, nonspecific finding, similar to comparison study. IMPRESSION: No acute intracranial abnormality. No calvarial fracture, significant scalp swelling or hematoma. Intracranial atherosclerosis. Asymmetric thickening and calcification in the left parotid gland, a nonspecific, chronic finding unchanged from prior. Can reflect sequela of a chronic inflammatory process. Debris in the external auditory canals, correlate for cerumen impaction. Electronically Signed   By: Lovena Le M.D.   On: 05/13/2020 00:44   CT ABDOMEN PELVIS W CONTRAST  Result Date: 05/13/2020 CLINICAL DATA:  Abdominal pain, fever, right upper quadrant pain EXAM: CT ABDOMEN AND PELVIS WITH CONTRAST TECHNIQUE: Multidetector CT imaging of the abdomen and pelvis was performed using the  standard protocol following bolus administration of intravenous contrast. CONTRAST:  128mL OMNIPAQUE IOHEXOL 300 MG/ML  SOLN COMPARISON:  CT 02/12/2020 FINDINGS: Lower chest: Basilar atelectatic changes in the lungs including more bandlike areas of subsegmental atelectasis or scarring. Normal heart size. No pericardial effusion. Hepatobiliary: Heterogeneous hepatic attenuation with a nodular liver surface contour concerning for intrinsic liver disease/cirrhosis. No concerning focal liver lesion. Moderate gallbladder distension. No significant gallbladder wall thickening or pericholecystic inflammation is seen. No visible calcified gallstones or biliary ductal dilatation. Pancreas: Slightly edematous appearance of the pancreatic parenchyma with very faint peripancreatic haze, uniform enhancement. No pancreatic ductal dilatation. Spleen: Mild splenomegaly.  No concerning focal splenic lesion. Adrenals/Urinary Tract: Normal adrenals. Kidneys enhance symmetrically. Symmetric and uniform  excretion. No concerning focal renal lesion. Bilateral renal cortical scarring. Nonobstructing calculi present in both kidneys. No obstructive urolithiasis or hydronephrosis is seen at this time. Bladder is unremarkable for the degree of distention. Stomach/Bowel: Paraesophageal venous collaterals and varices. Some minimal collateralization and varices about the gastric fundus as well. Thickening towards the gastric antrum likely reflecting normal peristalsis. Air and fluid-filled appearance of the duodenum with a normal sweep across the midline abdomen. Slightly edematous appearance diffusely through the small bowel is nonspecific given additional findings above. Moderate right-sided colonic stool burden, particularly within the cecum. Some mild circumferential thickening and mucosal hyperemia about the cecum and ascending colon is nonspecific given hepatic features. No evidence of mechanical obstruction. Appendix is not well visualized. Vascular/Lymphatic: Atherosclerotic calcifications within the abdominal aorta and branch vessels. No aneurysm or ectasia. Paraesophageal and gastric varices with venous collaterals, as above. Splenorenal collateralization. Upper abdominal varices.No enlarged abdominopelvic lymph nodes. Few scattered calcified lymph nodes are present in the abdomen and pelvis. Reproductive: Portion the pelvis obscured by streak artifact. 2.6 cm cystic focus in the left adnexa, unchanged from comparison. No concerning right adnexal lesion. Prior hysterectomy. Other: Increasing edematous changes of the central mesentery. Small volume low-attenuation fluid in the deep pelvis as well as trace perihepatic ascites. Circumferential body wall edema. No bowel containing hernia. No free abdominopelvic air. Musculoskeletal: Multilevel degenerative changes are present in the imaged portions of the spine. Grade 1 anterolisthesis L4 on L5 without spondylolysis, unchanged from prior. Prior left hip arthroplasty  with chronic bony remodeling and a similar appearance overall to comparison prior. IMPRESSION: Constellation of features suggestive of intrinsic liver disease/cirrhosis with features of portal hypertension including a nodular, heterogeneous liver with paraesophageal and gastric varices, additional upper abdominal venous collateralization, splenorenal shunting and splenomegaly. Interval development of a small volume ascites as well as additional features of at least mild or early developing anasarca with circumferential body wall edema and central mesenteric edema. Some hazy peripancreatic stranding may be present, possibly related to a diffusely edematous appearance of the mesentery though could correlate with lipase. Mildly thickened appearance of the cecum and ascending colon as well as a diffusely edematous appearance of the small bowel is nonspecific in the setting of suspected intrinsic liver disease. Could reflect portal enteropathy/colopathy though should exclude clinical symptoms of enterocolitis. Bilateral nonobstructing nephrolithiasis. No obstructive urolithiasis or hydronephrosis at this time. Bilateral renal cortical scarring. Prior hysterectomy. Stable 2.7 cm left ovarian cyst. Continued stability favoring a benign process. No follow-up imaging recommended. Note: This recommendation does not apply to premenarchal patients and to those with increased risk (genetic, family history, elevated tumor markers or other high-risk factors) of ovarian cancer. Reference: JACR 2020 Feb; 17(2):248-254 Aortic Atherosclerosis (ICD10-I70.0). Electronically Signed   By: Elwin Sleight.D.  On: 05/13/2020 00:35   US Abdomen Limited  Result Date: 05/13/2020 CLINICAL DATA:  Right upper quadrant pain. EXAM: ULTRASOUND ABDOMEN LIMITED RIGHT UPPER QUADRANT COMPARISON:  February 23, 2020 ultrasound.  CT scan May 12, 2020. FINDINGS: Gallbladder: A small amount of sludge is seen in the gallbladder. No stones, Murphy's sign,  or wall thickening. A small amount of ascites is identified, including adjacent to the gallbladder. Common bile duct: Diameter: 5 mm Liver: There is a nodular contour in the liver. No focal mass. Portal vein is patent on color Doppler imaging with normal direction of blood flow towards the liver. Other: Mild ascites. IMPRESSION: 1. Nodular contour to the liver suggesting cirrhosis.  Mild ascites. 2. Mild sludge in otherwise normal appearing gallbladder. A small amount of fluid adjacent to the gallbladder is likely due to the ascites. Electronically Signed   By: Dorise Bullion III M.D   On: 05/13/2020 09:06   US Paracentesis  Result Date: 05/14/2020 INDICATION: Fever, abdominal pain, question spontaneous bacterial peritonitis, ascites on CT EXAM: ULTRASOUND GUIDED DIAGNOSTIC PARACENTESIS MEDICATIONS: None COMPLICATIONS: None immediate PROCEDURE: Informed written consent was obtained from the patient after a discussion of the risks, benefits and alternatives to treatment. A timeout was performed prior to the initiation of the procedure. Initial ultrasound scanning demonstrates a small amount of ascites within the right lower abdominal quadrant. The right lower abdomen was prepped and draped in the usual sterile fashion. 1% lidocaine was used for local anesthesia. Following this, a 5 [place] Yueh catheter was introduced. An ultrasound image was saved for documentation purposes. The paracentesis was performed. The catheter was removed and a dressing was applied. The patient tolerated the procedure well without immediate post procedural complication. FINDINGS: A total of approximately 20 mL of clear yellow ascitic fluid was removed. Samples were sent to the laboratory as requested by the clinical team. IMPRESSION: Successful ultrasound-guided paracentesis yielding 20 mL of peritoneal fluid for diagnostic testing. Electronically Signed   By: Lavonia Dana M.D.   On: 05/14/2020 12:39   DG CHEST PORT 1 VIEW  Result  Date: 05/24/2020 CLINICAL DATA:  54 year old female with tachycardia. EXAM: PORTABLE CHEST 1 VIEW COMPARISON:  Chest radiograph dated 05/22/2020. FINDINGS: Right-sided PICC with tip at the cavoatrial junction. No interval change in bilateral interstitial reticulonodular densities. No pleural effusion or pneumothorax. Stable cardiomediastinal silhouette. No acute osseous pathology. IMPRESSION: No interval change in the bilateral interstitial reticulonodular densities. Electronically Signed   By: Anner Crete M.D.   On: 05/24/2020 01:33   DG CHEST PORT 1 VIEW  Result Date: 05/22/2020 CLINICAL DATA:  Cough EXAM: PORTABLE CHEST 1 VIEW COMPARISON:  Chest radiograph dated 05/20/2020. FINDINGS: The heart size and mediastinal contours are within normal limits. Moderate bilateral interstitial and airspace opacities appear unchanged since prior exam. A right upper extremity peripherally inserted central venous catheter tip overlies the superior cavoatrial junction. IMPRESSION: Moderate bilateral interstitial and airspace opacities are not significantly changed and likely represent pulmonary edema. Electronically Signed   By: Zerita Boers M.D.   On: 05/22/2020 16:46   DG CHEST PORT 1 VIEW  Result Date: 05/20/2020 CLINICAL DATA:  Pulmonary edema EXAM: PORTABLE CHEST 1 VIEW COMPARISON:  05/16/2020 chest radiograph. FINDINGS: Right PICC terminates over the right atrium. Stable cardiomediastinal silhouette with normal heart size. No pneumothorax. No pleural effusion. Patchy hazy opacities throughout both lungs, significantly improved. IMPRESSION: Significantly improved patchy hazy opacities throughout both lungs, compatible with improving pulmonary edema. Electronically Signed   By: Janina Mayo.D.  On: 05/20/2020 07:32   DG CHEST PORT 1 VIEW  Result Date: 05/16/2020 CLINICAL DATA:  Shortness of breath. EXAM: PORTABLE CHEST 1 VIEW COMPARISON:  Chest x-ray dated May 12, 2020. FINDINGS: The heart size and  mediastinal contours are within normal limits. Prominent diffuse interstitial thickening with bilateral perihilar opacities. Small bilateral pleural effusions. No pneumothorax. No acute osseous abnormality. IMPRESSION: 1. Moderate to severe pulmonary edema with small bilateral pleural effusions. Electronically Signed   By: Titus Dubin M.D.   On: 05/16/2020 12:57   DG Chest Portable 1 View  Result Date: 05/12/2020 CLINICAL DATA:  Right sided rib pain. EXAM: PORTABLE CHEST 1 VIEW COMPARISON:  February 12, 2020 FINDINGS: A very mild amount of linear atelectasis is seen within the right lung base. There is no evidence of acute infiltrate, pleural effusion or pneumothorax. The heart size and mediastinal contours are within normal limits. A chronic seventh right rib fracture is seen. IMPRESSION: 1. Very mild right basilar linear atelectasis. 2. Chronic seventh right rib fracture. Electronically Signed   By: Virgina Norfolk M.D.   On: 05/12/2020 23:16   US ABDOMEN COMPLETE W/ELASTOGRAPHY  Result Date: 05/03/2020 CLINICAL DATA:  Fatty liver EXAM: ULTRASOUND ABDOMEN ULTRASOUND HEPATIC ELASTOGRAPHY TECHNIQUE: Sonography of the upper abdomen was performed. In addition, ultrasound elastography evaluation of the liver was performed. A region of interest was placed within the right lobe of the liver. Following application of a compressive sonographic pulse, tissue compressibility was assessed. Multiple assessments were performed at the selected site. Median tissue compressibility was determined. Previously, hepatic stiffness was assessed by shear wave velocity. Based on recently published Society of [employer] in Ultrasound consensus article, reporting is now recommended to be performed in the SI units of pressure (kiloPascals) representing hepatic stiffness/elasticity. The obtained result is compared to the published reference standards. (cACLD = compensated Advanced Chronic Liver Disease) COMPARISON:  02/23/2020  FINDINGS: ULTRASOUND ABDOMEN Gallbladder: Gallbladder partially contracted without evidence of gallstones or sonographic Murphy sign. Small amount of internal sludge is present. No wall thickening. Common bile duct: Diameter: 6 mm upper normal Liver: Echogenic hepatic parenchyma with nodular contour consistent with cirrhosis. No discrete hepatic mass. Portal vein is patent on color Doppler imaging with normal direction of blood flow towards the liver. IVC: Normal appearance Pancreas: Normal appearance Spleen: 11.9 cm length, calculated volume 399 mL.  No focal mass. Right Kidney: Length: 11.9 cm. Normal morphology without mass or hydronephrosis. Left Kidney: Length: 12.0 cm. Normal morphology without mass or hydronephrosis. Abdominal aorta: Normal caliber Other findings: Scattered ascites. ULTRASOUND HEPATIC ELASTOGRAPHY Device: Siemens Helix VTQ Patient position: Not recorded Transducer 5C1 Number of measurements: 10 Hepatic segment:  8 Median kPa: 95.8 IQR: 11.6 IQR/Median kPa ratio: 0.1 Data quality:  Good Diagnostic category: > or =17 kPa: highly suggestive of cACLD with an increased probability of clinically significant portal hypertension The use of hepatic elastography is applicable to patients with viral hepatitis and non-alcoholic fatty liver disease. At this time, there is insufficient data for the referenced cut-off values and use in other causes of liver disease, including alcoholic liver disease. Patients, however, may be assessed by elastography and serve as their own reference standard/baseline. In patients with non-alcoholic liver disease, the values suggesting compensated advanced chronic liver disease (cACLD) may be lower, and patients may need additional testing with elasticity results of 7-9 kPa. Please note that abnormal hepatic elasticity and shear wave velocities may also be identified in clinical settings other than with hepatic fibrosis, such as: acute hepatitis, elevated  right heart and  central venous pressures including use of beta blockers, veno-occlusive disease (Budd-Chiari), infiltrative processes such as mastocytosis/amyloidosis/infiltrative tumor/lymphoma, extrahepatic cholestasis, with hyperemia in the post-prandial state, and with liver transplantation. Correlation with patient history, laboratory data, and clinical condition recommended. Diagnostic Categories: < or =5 kPa: high probability of being normal < or =9 kPa: in the absence of other known clinical signs, rules out cACLD >9 kPa and ?13 kPa: suggestive of cACLD, but needs further testing >13 kPa: highly suggestive of cACLD > or =17 kPa: highly suggestive of cACLD with an increased probability of clinically significant portal hypertension IMPRESSION: ULTRASOUND ABDOMEN: Cirrhotic appearing liver without focal mass. ULTRASOUND HEPATIC ELASTOGRAPHY: Median kPa:  95.8 Diagnostic category: > or =17 kPa: highly suggestive of cACLD with an increased probability of clinically significant portal hypertension Electronically Signed   By: Lavonia Dana M.D.   On: 05/03/2020 17:09   ECHOCARDIOGRAM COMPLETE  Result Date: 05/15/2020    ECHOCARDIOGRAM REPORT   Patient Name:   JAZSMIN COUSE Date of Exam: 05/15/2020 Medical Rec #:  [id number]       Height:       60.0 in Accession #:    [id number]      Weight:       127.9 lb Date of Birth:  02-15-1965        BSA:          1.543 m Patient Age:    28 years        BP:           88/64 mmHg Patient Gender: F               HR:           95 bpm. Exam Location:  [facility] Procedure: 2D Echo, Cardiac Doppler and Color Doppler Indications:    Bacteremia R78.81  History:        Patient has prior history of Echocardiogram examinations, most                 recent 02/23/2020. Risk Factors:Dyslipidemia. Cardiac Arrest,                 Elevated Liver Function Test.  Sonographer:    Alvino Chapel RCS Referring Phys: [id number] [place]  1. Left ventricular ejection fraction, by estimation, is 60 to  65%. The left ventricle has normal function. The left ventricle has no regional wall motion abnormalities. Left ventricular diastolic parameters are indeterminate.  2. Right ventricular systolic function is normal. The right ventricular size is normal.  3. Left atrial size was moderately dilated.  4. The mitral valve is normal in structure. No evidence of mitral valve regurgitation. No evidence of mitral stenosis.  5. The aortic valve has an indeterminant number of cusps. Aortic valve regurgitation is not visualized. No aortic stenosis is present.  6. The inferior vena cava is normal in size with greater than 50% respiratory variability, suggesting right atrial pressure of 3 mmHg. FINDINGS  Left Ventricle: Left ventricular ejection fraction, by estimation, is 60 to 65%. The left ventricle has normal function. The left ventricle has no regional wall motion abnormalities. The left ventricular internal cavity size was normal in size. There is  no left ventricular hypertrophy. Left ventricular diastolic parameters are indeterminate. Right Ventricle: The right ventricular size is normal. No increase in right ventricular wall thickness. Right ventricular systolic function is normal. Left Atrium: Left atrial size was moderately dilated. Right Atrium: Right atrial size  was not well visualized. Pericardium: There is no evidence of pericardial effusion. Mitral Valve: The mitral valve is normal in structure. No evidence of mitral valve regurgitation. No evidence of mitral valve stenosis. Tricuspid Valve: The tricuspid valve is normal in structure. Tricuspid valve regurgitation is not demonstrated. No evidence of tricuspid stenosis. Aortic Valve: The aortic valve has an indeterminant number of cusps. Aortic valve regurgitation is not visualized. No aortic stenosis is present. Aortic valve mean gradient measures 5.1 mmHg. Aortic valve peak gradient measures 8.2 mmHg. Aortic valve area, by VTI measures 2.13 cm. Pulmonic Valve:  The pulmonic valve was not well visualized. Pulmonic valve regurgitation is not visualized. No evidence of pulmonic stenosis. Aorta: The aortic root is normal in size and structure. Pulmonary Artery: Inadequate TR jet, indeterminant PASP. Venous: The inferior vena cava is normal in size with greater than 50% respiratory variability, suggesting right atrial pressure of 3 mmHg. IAS/Shunts: No atrial level shunt detected by color flow Doppler.  LEFT VENTRICLE PLAX 2D LVIDd:         4.80 cm  Diastology LVIDs:         3.10 cm  LV e' medial:    10.60 cm/s LV PW:         1.00 cm  LV E/e' medial:  11.3 LV IVS:        1.00 cm  LV e' lateral:   9.14 cm/s LVOT diam:     1.80 cm  LV E/e' lateral: 13.1 LV SV:         64 LV SV Index:   41 LVOT Area:     2.54 cm  RIGHT VENTRICLE RV S prime:     16.30 cm/s TAPSE (M-mode): 2.6 cm LEFT ATRIUM             Index       RIGHT ATRIUM           Index LA diam:        4.20 cm 2.72 cm/m  RA Area:     14.20 cm LA Vol (A2C):   53.8 ml 34.86 ml/m RA Volume:   36.10 ml  23.39 ml/m LA Vol (A4C):   64.2 ml 41.60 ml/m LA Biplane Vol: 62.5 ml 40.49 ml/m  AORTIC VALVE AV Area (Vmax):    2.40 cm AV Area (Vmean):   2.00 cm AV Area (VTI):     2.13 cm AV Vmax:           143.34 cm/s AV Vmean:          109.425 cm/s AV VTI:            0.301 m AV Peak Grad:      8.2 mmHg AV Mean Grad:      5.1 mmHg LVOT Vmax:         135.00 cm/s LVOT Vmean:        86.200 cm/s LVOT VTI:          0.251 m LVOT/AV VTI ratio: 0.84  AORTA Ao Root diam: 3.00 cm MITRAL VALVE MV Area (PHT): 3.97 cm     SHUNTS MV Decel Time: 191 msec     Systemic VTI:  0.25 m MV E velocity: 120.00 cm/s  Systemic Diam: 1.80 cm MV A velocity: 87.30 cm/s MV E/A ratio:  1.37 Carlyle Dolly MD Electronically signed by Carlyle Dolly MD Signature Date/Time: 05/15/2020/2:16:14 PM    Final    ECHO TEE  Result Date: 05/16/2020    TRANSESOPHOGEAL ECHO REPORT  Patient Name:   [name] Date of Exam: 05/16/2020 Medical Rec #:  [id number]        Height:       60.0 in Accession #:    [id number]      Weight:       127.9 lb Date of Birth:  1965-03-15        BSA:          1.543 m Patient Age:    64 years        BP:           123/85 mmHg Patient Gender: F               HR:           101 bpm. Exam Location:  [facility] Procedure: Transesophageal Echo, Cardiac Doppler and Color Doppler Indications:    Bacteremia  History:        Patient has prior history of Echocardiogram examinations, most                 recent 05/15/2020. Risk Factors:Dyslipidemia. Cardiac Arrest,                 Elevated Liver Function Test.  Sonographer:    Alvino Chapel RCS Referring Phys: [id number] [place]: The transesophogeal probe was passed without difficulty through the esophogus of the patient. Sedation performed by different physician. The patient developed no complications during the procedure. IMPRESSIONS  1. Left ventricular ejection fraction, by estimation, is 60 to 65%. The left ventricle has normal function.  2. Right ventricular systolic function is normal. The right ventricular size is normal.  3. Left atrial size was mildly dilated. No left atrial/left atrial appendage thrombus was detected. The LAA emptying velocity was 85 cm/s.  4. Right atrial size was mildly dilated.  5. The mitral valve is normal in structure. Trivial mitral valve regurgitation. No evidence of mitral stenosis.  6. The aortic valve is tricuspid. Aortic valve regurgitation is not visualized. No aortic stenosis is present. Conclusion(s)/Recommendation(s): No evidence of vegetation/infective endocarditis on this transesophageal echocardiogram. FINDINGS  Left Ventricle: Left ventricular ejection fraction, by estimation, is 60 to 65%. The left ventricle has normal function. The left ventricular internal cavity size was normal in size. Right Ventricle: The right ventricular size is normal. No increase in right ventricular wall thickness. Right ventricular systolic function is normal. Left  Atrium: Left atrial size was mildly dilated. No left atrial/left atrial appendage thrombus was detected. The LAA emptying velocity was 85 cm/s. Right Atrium: Right atrial size was mildly dilated. Pericardium: There is no evidence of pericardial effusion. Mitral Valve: The mitral valve is normal in structure. Trivial mitral valve regurgitation. No evidence of mitral valve stenosis. Tricuspid Valve: The tricuspid valve is normal in structure. Tricuspid valve regurgitation is trivial. No evidence of tricuspid stenosis. Aortic Valve: The aortic valve is tricuspid. Aortic valve regurgitation is not visualized. No aortic stenosis is present. Pulmonic Valve: The pulmonic valve was normal in structure. Pulmonic valve regurgitation is not visualized. No evidence of pulmonic stenosis. Aorta: The aortic root is normal in size and structure. IAS/Shunts: No atrial level shunt detected by color flow Doppler. Carlyle Dolly MD Electronically signed by Carlyle Dolly MD Signature Date/Time: 05/16/2020/1:28:24 PM    Final    [name] ASCITES (ABDOMEN LIMITED)  Result Date: 05/14/2020 CLINICAL DATA:  Ascites, question sufficient for paracentesis, clinical concern for spontaneous bacterial peritonitis EXAM: LIMITED ABDOMEN ULTRASOUND FOR ASCITES TECHNIQUE: Limited ultrasound survey  for ascites was performed in all four abdominal quadrants. COMPARISON:  CT abdomen and pelvis 05/13/2020 FINDINGS: Small volume ascites identified in the lower quadrants bilaterally. Volume of ascites has probably slightly increased since prior CT. While the volume of fluid is insufficient for therapeutic paracentesis, a small volume of ascites may be obtainable for diagnostic purposes. Diagnostic paracentesis will be attempted. IMPRESSION: Small volume ascites, likely sufficient volume for diagnostic paracentesis. Electronically Signed   By: Lavonia Dana M.D.   On: 05/14/2020 11:11   [name] EKG SITE RITE  Result Date: 05/17/2020 If Site Rite image not attached,  placement could not be confirmed due to current cardiac rhythm.   Barton Dubois, MD  [facility]  If 7PM-7AM, please contact night-coverage [email or web address] Password [email or web address] 05/24/2020, 4:11 PM   LOS: 11 days

## 2020-05-24 NOTE — Progress Notes (Signed)
[facility] night shift.  The patient was seen due to restlessness and tachypnea.  Earlier in the shift, she was febrile with a temperature of 102.7 F, heart rate of 110 BPM and respirations in the mid 20s. Her BP was 135/97 mmHg.  She is currently being treated for MSSA bacteremia.  Her lung sounds were diminished on the bases, but no wheezing or rhonchi.  Heart rate, S1-S2 tachycardic 106 bpm.  Abdomen was soft, nontender.  Extremities no edema, clubbing or cyanosis.  Neuro oriented to name and sometimes answer simple questions, but unable to elaborate.  She was given acetaminophen.  She was started on NS plus KCl 45mEq at 125 mL/h for 8 hours and haloperidol 3 mg IVP x1.  I added LR 1000 mL bolus after her lactic acid came back at 2.4 mmol/L.  Her sodium level was also 147 mmol/L so dextrose 5% at 125 ml/hr started for 10 hours.  The rest of the labs showed a CBC with a white count of 18.6, hemoglobin 7.3 g/dL platelets 249. BUN was 21 and creatinine 0.82 mg/dL.   Assessment: 1-Hypernatremia 2-Mild lactic acidosis  Plan: Continue IV fluids. Continue current antibiotic therapy.  Tennis Must, MD.

## 2020-05-25 ENCOUNTER — Inpatient Hospital Stay (HOSPITAL_COMMUNITY): Payer: Medicare HMO

## 2020-05-25 LAB — CBC
HCT: 20.6 % — ABNORMAL LOW (ref 36.0–46.0)
Hemoglobin: 6.8 g/dL — CL (ref 12.0–15.0)
MCH: 30 pg (ref 26.0–34.0)
MCHC: 33 g/dL (ref 30.0–36.0)
MCV: 90.7 fL (ref 80.0–100.0)
Platelets: 262 10*3/uL (ref 150–400)
RBC: 2.27 MIL/uL — ABNORMAL LOW (ref 3.87–5.11)
RDW: 18.4 % — ABNORMAL HIGH (ref 11.5–15.5)
WBC: 13.9 10*3/uL — ABNORMAL HIGH (ref 4.0–10.5)
nRBC: 0.7 % — ABNORMAL HIGH (ref 0.0–0.2)

## 2020-05-25 LAB — LACTIC ACID, PLASMA: Lactic Acid, Venous: 2.6 mmol/L (ref 0.5–1.9)

## 2020-05-25 LAB — BASIC METABOLIC PANEL
Anion gap: 10 (ref 5–15)
BUN: 23 mg/dL — ABNORMAL HIGH (ref 6–20)
CO2: 23 mmol/L (ref 22–32)
Calcium: 7.5 mg/dL — ABNORMAL LOW (ref 8.9–10.3)
Chloride: 109 mmol/L (ref 98–111)
Creatinine, Ser: 0.73 mg/dL (ref 0.44–1.00)
GFR, Estimated: >60 mL/min (ref >60)
Glucose, Bld: 140 mg/dL — ABNORMAL HIGH (ref 70–99)
Potassium: 3.2 mmol/L — ABNORMAL LOW (ref 3.5–5.1)
Sodium: 142 mmol/L (ref 135–145)

## 2020-05-25 LAB — PREPARE RBC (CROSSMATCH)

## 2020-05-25 MED ORDER — LACTATED RINGERS IV BOLUS
1000.0000 mL | Freq: Once | INTRAVENOUS | Status: AC
  Administered 2020-05-25: 1000 mL via INTRAVENOUS

## 2020-05-25 MED ORDER — POTASSIUM CHLORIDE 10 MEQ/100ML IV SOLN
10.0000 meq | INTRAVENOUS | Status: AC
  Administered 2020-05-25 (×2): 10 meq via INTRAVENOUS
  Filled 2020-05-25: qty 100

## 2020-05-25 MED ORDER — STARCH (THICKENING) PO POWD
ORAL | Status: DC | PRN
  Filled 2020-05-25: qty 227

## 2020-05-25 MED ORDER — FOOD THICKENER (SIMPLYTHICK)
1.0000 | ORAL | Status: DC | PRN
  Filled 2020-05-25: qty 1

## 2020-05-25 MED ORDER — FUROSEMIDE 10 MG/ML IJ SOLN
20.0000 mg | Freq: Once | INTRAMUSCULAR | Status: AC
  Administered 2020-05-25: 20 mg via INTRAVENOUS
  Filled 2020-05-25: qty 2

## 2020-05-25 MED ORDER — SODIUM CHLORIDE 0.9% IV SOLUTION: Freq: Once | INTRAVENOUS | Status: AC

## 2020-05-25 NOTE — Care Management Important Message (Signed)
Important Message  Patient Details  Name: [name] MRN: [id number] Date of Birth: 1965/05/06   Medicare Important Message Given:  Yes     Tommy Medal 05/25/2020, 12:52 PM

## 2020-05-25 NOTE — Progress Notes (Signed)
PROGRESS NOTE  SOPHIAGRACE BENBROOK PJK:[id number] DOB: [birth year] DOA: 05/12/2020 PCP: [name], MD   Brief History:  55 year old female with a history of COPD, meningioma, cardiac arrest December 2020, hepatic steatosis,PNES, cognitive impairment, depression, complex partial seizure presentingwithgeneralized weakness and a fall.This has been a recurrent problem for the patient. She was admitted on 2/7 to 02/24/20 with a similar presentation. At that time she was treated for acute metabolic encephalopathy which was thought to be multifactorial including elevated ammonia, dehydration and dopamine agonist. She was discharged home with HHPT. She follow up with [facility], Dr. 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 She underwent liver elastography on 05/03/20 which suggested cACLD with increased probability of clinically significant portal HTN. She presented on 05/12/20 with another mechanical fall.She reports that her house is being remodeled and she tripped on something and fell forward hit her head on the railing and landed on her right chest. She reports that she hit the back of her head on the way down. She had no loss of consciousness. At presentation her temperature was 101.Blood cultures were done and she was found to have MSSA bacteremia. Also CT of the abd/pelvis on 05/13/20 showed signs of cirrhosis with portal HTN. She also had splenomegaly. THere was also a mildly thickened cecum and ascending colon with diffuse edematous appearance of the sm bowel. She underwent TEE on 05/16/20 after which she remained hypoxic on NRB.  TEE was negative for vegetation. She was then transferred to SDU for further care.She was started on IV lasix with clinical improvement.  Assessment/Plan: Severe Sepsis -present on admission -presented with fever, tachycardia, tachypnea, elevated lactate -due to MSSA bacteremia -Lactic peaked  2.2>>1.8>>2.6 (maybe in the setting of dehydration); IVF's added. -UA 6-10 WBC -4/30CXR--no consolidation -Random cortisol level 17.1 -Continue supportive care and follow clinical response -WBCs down to 13K; patient now afebrile for 24 hours.  Mild hypernatremia and elevated lactic acid -improved with addition of IVF's -follow clinical response and ability to maintain oral intake.  MSSA Bacteremia -05/12/20 blood culture--positive MSSA -05/14/20 blood cuture--neg to date -continue cefazolin -05/16/20 TEE--no vegetation -PICC line 5/6 if surveillance blood cultures remain neg -plan is to continue abx till 5/29  Hepatic Encephalopathy/Acute metabolic Encephalopathy -Over last 48 hours, pt more confused, slower to answer questions -multifactorial including opioids, hypnotic meds, elevated ammonia -UA--neg for pyuria -05/22/20 ammonia--64>>60>>49 -Continue lactulose at current dose.  Acute respiratory failure with hypoxia -oxygen saturation 88-90% on NRB in PACU 5/4 -personally reviewed CXR--bilateral interstitial infiltrates -5/4/22ABG 7.471/32/57/24 (0.8) -continuelasix 40 mg IV--increase to bid -05/15/20 Echo=EF 60-65%, no WMA, indeterminant diastolic -now down to 8.0D [place]>>RA -plan to discharge on lasix 40 mg po daily -Continue to follow daily weights and strict I's and O's.  Chronic pain -d/c dilaudid due to drowsiness -5/8 at 0120--started on fentanyl due to n/v -5/8 afternoon--remains drowsy -5/8 AM RN reports patient tolerating diet without n/v -d/c fentanyl -restartedoxycodone>>cut to 1/2 dose; follow clinical response.  NAFLD/Liver Cirrhosis -05/13/20 CT abd as discussed above -05/14/20 paracentesis--WBC 51, only 20 cc removed -remains mildly hypervolemic; will continue lasix -Continue lactulose at current dose. -Mentation continues slowly but steadily to improved -Will continue to follow intermittently ammonia level -ammonia level 49  Colonic Wall  Thickening -?portal HTN colopathy -Tolerating diet -Mild vague discomfort reported after lactulose initiated -good response to bowel regimen; positive BM's reported -will continue following.   Gait instability/frequent  falls/dizziness -PT evaluation-->SNF has been recommended. -CT brain negativeacute findings -No focal deficits on exam -5/3/22echo--EF 60-65%, no WMA -No abnormalities appreciated on telemetry.  Complex partial seizure/PNES -Continue home dose of Keppra and Lamictal -No active seizure appreciated currently. -seizure precaution in place.   Depression -Continue fluoxetine 80 mg daily -Continue as needed lorazepam  Meningioma -Patient follows Dr. [name] -s/p fractionated radiosurgery Sept 2020--Dr. [name] -01/18/20 MR brain--mass is stable in size as compared to the brain MRI of 09/13/2019 -no focal deficits on exam  Hypokalemia -Continue as needed repletion -Will continue to follow electrolytes intermittently.  Transaminasemia -gradual rise due to hemodynamic changes (soft BP) in the setting of NAFLD and hepatic congestion (fluid overload) -05/14/20 RUQ US--Nodular contour to the liver suggesting cirrhosis. Mild ascites. Mild sludge in otherwise normal appearing gallbladder. A small amount of fluid adjacent to the gallbladder is likely due to the ascites. -05/13/20 viral hepatitis serology--neg -Continue to follow LFTs trend intermittently   Restless Leg -previously discontinued mirapex due to dopamine agonist causing confusion -Overall confusion improved -No complaining of urinary cramps. -Continue holding Mirapex.   Status is: Inpatient  Remains inpatient appropriate because:IV treatments appropriate due to intensity of illness or inability to take PO   Dispo: The patient is from: Home  Anticipated d/c is to: SNF  Patient currently is not medically stable to d/c.              Difficult to place patient  No    Family Communication:   Son updated at bedside.  Consultants:  none  Code Status:  FULL  DVT Prophylaxis:  SCD's   Procedures: As Listed in Progress Note Above  Antibiotics: Cefazolin  5/2>> antibiotics completion anticipated on 06/10/20    Subjective: Afebrile for 24 hours now; no chest pain, no nausea, no vomiting.  Patient still having some difficulties with thin liquids during the speech therapy evaluation.  Noticed to have elevated lactic acid and also dropped in her hemoglobin to 6.8.  Objective: Vitals:   05/25/20 1029 05/25/20 1054 05/25/20 1232 05/25/20 1356  BP: 102/72 105/71 110/74 97/63  Pulse: 78 80 81 78  Resp: (!) 22 (!) 22 (!) 24 16  Temp: 98.2 F (36.8 C) 98.4 F (36.9 C) 98.1 F (36.7 C) 98 F (36.7 C)  TempSrc: Oral Oral Oral Oral  SpO2: 100% 100% 96% 98%  Weight:      Height:        Intake/Output Summary (Last 24 hours) at 05/25/2020 1737 Last data filed at 05/25/2020 1700 Gross per 24 hour  Intake 2774.75 ml  Output 451 ml  Net 2323.75 ml   Weight change: -4.6 kg  Exam: General exam: Alert, awake, oriented x 2; afebrile for 24 hours; no chest pain, no nausea, no vomiting and reported improvement in her abdominal pain.  Positive bowel movements overnight. Respiratory system: Good oxygen saturation on room air; positive rhonchi.  No wheezing, no crackles Cardiovascular system:RRR. No murmurs, rubs, gallops.  No JVD on exam. Gastrointestinal system: Abdomen is mildly distended, no guarding, no pain reported on palpation.  Positive bowel sounds.  Mild ascites appreciated on examination. Central nervous system: Alert and oriented. No focal neurological deficits. Extremities: No cyanosis or clubbing; left posterolateral thigh with extensive bruising/hematoma again noticed; no open wounds. Skin: No petechiae. Psychiatry: Mood & affect appropriate.    Data Reviewed: I have personally reviewed following labs and imaging  studies  Basic Metabolic Panel: Recent Labs  Lab 05/19/20 0421 05/20/20 0459 05/22/20 0444 05/23/20  7628 05/24/20 0108 05/25/20 0454  NA 135 137 144 144 147* 142  K 3.3* 3.5 5.1 3.2* 4.0 3.2*  CL 101 101 109 111 115* 109  CO2 27 26 23 24 22 23   GLUCOSE 105* 112* 89 123* 127* 140*  BUN 11 12 17 19  21* 23*  CREATININE 0.47 0.48 0.80 0.75 0.82 0.73  CALCIUM 7.4* 7.7* 8.0* 7.8* 7.8* 7.5*  MG 1.7 2.1 1.7  --  2.6*  --    Liver Function Tests: Recent Labs  Lab 05/20/20 0459 05/22/20 0444 05/23/20 0436  AST 111* 248* 310*  ALT 36 59* 68*  ALKPHOS 143* 167* 141*  BILITOT 1.5* 2.4* 2.3*  PROT 5.4* 5.5* 4.9*  ALBUMIN 2.3* 2.5* 2.3*    Recent Labs  Lab 05/22/20 1016 05/22/20 1257 05/24/20 0108  AMMONIA 64* 60* 49*   Coagulation Profile: Recent Labs  Lab 05/22/20 0841 05/23/20 0436  INR 2.1* 2.5*   CBC: Recent Labs  Lab 05/20/20 0459 05/23/20 0436 05/24/20 0108 05/25/20 0454  WBC 8.6 16.3* 18.6* 13.9*  HGB 9.5* 7.8* 7.3* 6.8*  HCT 28.3* 22.8* 21.8* 20.6*  MCV 88.7 87.4 90.5 90.7  PLT 235 238 249 262   Urine analysis:    Component Value Date/Time   COLORURINE YELLOW 05/21/2020 1355   APPEARANCEUR CLEAR 05/21/2020 1355   APPEARANCEUR Clear 01/30/2017 1020   LABSPEC 1.017 05/21/2020 1355   PHURINE 6.0 05/21/2020 1355   GLUCOSEU NEGATIVE 05/21/2020 1355   HGBUR MODERATE (A) 05/21/2020 1355   BILIRUBINUR NEGATIVE 05/21/2020 1355   BILIRUBINUR Negative 01/30/2017 [place] 05/21/2020 1355   PROTEINUR NEGATIVE 05/21/2020 1355   UROBILINOGEN negative 04/19/2014 1332   UROBILINOGEN 0.2 12/04/2013 1414   NITRITE NEGATIVE 05/21/2020 1355   LEUKOCYTESUR NEGATIVE 05/21/2020 1355   Sepsis Labs:  Recent Results (from the past 240 hour(s))  MRSA PCR Screening     Status: None   Collection Time: 05/16/20  2:57 PM   Specimen: Nasal Mucosa; Nasopharyngeal  Result Value Ref Range Status   MRSA by PCR NEGATIVE NEGATIVE Final    Comment:         The GeneXpert MRSA Assay (FDA approved for NASAL specimens only), is one component of a comprehensive MRSA colonization surveillance program. It is not intended to diagnose MRSA infection nor to guide or monitor treatment for MRSA infections. Performed at [facility], [address], [place], [place] [address]   Culture, Urine     Status: None   Collection Time: 05/21/20  1:55 PM   Specimen: Urine, Random  Result Value Ref Range Status   Specimen Description   Final    URINE, RANDOM Performed at [facility], [address] [place], [place] [address]    Special Requests   Final    NONE Performed at [facility], [address], [place], [place] [address]    Culture   Final    NO GROWTH Performed at [facility], [place] [address] [place], [place] [address]    Report Status 05/23/2020 FINAL  Final  Culture, blood (Routine X 2) w Reflex to ID Panel     Status: None (Preliminary result)   Collection Time: 05/22/20  4:33 PM   Specimen: BLOOD RIGHT ARM  Result Value Ref Range Status   Specimen Description BLOOD RIGHT ARM  Final   Special Requests   Final    BOTTLES DRAWN AEROBIC AND ANAEROBIC Blood Culture results may not be optimal due to an excessive volume  of blood received in culture bottles   Culture   Final    NO GROWTH 3 DAYS Performed at [facility], [address], [place], [place] [address]    Report Status PENDING  Incomplete  Culture, blood (Routine X 2) w Reflex to ID Panel     Status: None (Preliminary result)   Collection Time: 05/22/20  4:33 PM   Specimen: BLOOD LEFT ARM  Result Value Ref Range Status   Specimen Description BLOOD LEFT ARM  Final   Special Requests   Final    BOTTLES DRAWN AEROBIC AND ANAEROBIC Blood Culture adequate volume   Culture   Final    NO GROWTH 3 DAYS Performed at [facility], [address] [place], [place] [address]    Report Status PENDING  Incomplete     Scheduled Meds: . Chlorhexidine Gluconate Cloth  6 each  Topical Daily  . feeding supplement  237 mL Oral BID BM  . folic acid  1 mg Oral Daily  . furosemide  40 mg Oral Daily  . haloperidol lactate  3 mg Intravenous Once  . lactobacillus  1 g Oral TID WC  . lactulose  20 g Oral TID  . lamoTRIgine  150 mg Oral BID  . levETIRAcetam  1,000 mg Oral BID  . metoprolol tartrate  12.5 mg Oral BID  . senna-docusate  1 tablet Oral BID  . sodium chloride flush  10-40 mL Intracatheter Q12H   Continuous Infusions: .  ceFAZolin (ANCEF) IV Stopped (05/25/20 1116)  . dextrose 75 mL/hr at 05/25/20 [id number]    Procedures/Studies: CT HEAD WO CONTRAST  Result Date: 05/24/2020 CLINICAL DATA:  55 year old female with altered mental status. EXAM: CT HEAD WITHOUT CONTRAST TECHNIQUE: Contiguous axial images were obtained from the base of the skull through the vertex without intravenous contrast. COMPARISON:  Head CT dated 05/12/2020. FINDINGS: Evaluation of this exam is limited due to motion artifact. Brain: The ventricles and sulci appropriate size for patient's age. The gray-white matter discrimination is preserved. There is no acute intracranial hemorrhage. No mass effect or midline shift. No extra-axial fluid collection. Vascular: No hyperdense vessel or unexpected calcification. Skull: Normal. Negative for fracture or focal lesion. Sinuses/Orbits: No acute finding. Other: Mild left forehead contusion. IMPRESSION: No acute intracranial pathology. Electronically Signed   By: Anner Crete M.D.   On: 05/24/2020 02:17   CT HEAD WO CONTRAST  Result Date: 05/13/2020 CLINICAL DATA:  Fall 2 days prior EXAM: CT HEAD WITHOUT CONTRAST TECHNIQUE: Contiguous axial images were obtained from the base of the skull through the vertex without intravenous contrast. COMPARISON:  CT 02/20/2020 FINDINGS: Brain: No evidence of acute infarction, hemorrhage, hydrocephalus, extra-axial collection, visible mass lesion or mass effect. Vascular: Atherosclerotic calcification of the carotid  siphons. No hyperdense vessel. Skull: No calvarial fracture or suspicious osseous lesion. No scalp swelling or hematoma. Sinuses/Orbits: Paranasal sinuses are predominantly clear. Mastoid air cells appear chronically hypo pneumatized. Middle ear cavities are clear. Debris in the bilateral external auditory canals. Included orbital structures are unremarkable. Other: Asymmetric thickening and calcification in the left parotid gland is a chronic, nonspecific finding, similar to comparison study. IMPRESSION: No acute intracranial abnormality. No calvarial fracture, significant scalp swelling or hematoma. Intracranial atherosclerosis. Asymmetric thickening and calcification in the left parotid gland, a nonspecific, chronic finding unchanged from prior. Can reflect sequela of a chronic inflammatory process. Debris in the external auditory canals, correlate for cerumen impaction. Electronically Signed   By: Lovena Le M.D.   On:  05/13/2020 00:44   CT ABDOMEN PELVIS W CONTRAST  Result Date: 05/13/2020 CLINICAL DATA:  Abdominal pain, fever, right upper quadrant pain EXAM: CT ABDOMEN AND PELVIS WITH CONTRAST TECHNIQUE: Multidetector CT imaging of the abdomen and pelvis was performed using the standard protocol following bolus administration of intravenous contrast. CONTRAST:  175mL OMNIPAQUE IOHEXOL 300 MG/ML  SOLN COMPARISON:  CT 02/12/2020 FINDINGS: Lower chest: Basilar atelectatic changes in the lungs including more bandlike areas of subsegmental atelectasis or scarring. Normal heart size. No pericardial effusion. Hepatobiliary: Heterogeneous hepatic attenuation with a nodular liver surface contour concerning for intrinsic liver disease/cirrhosis. No concerning focal liver lesion. Moderate gallbladder distension. No significant gallbladder wall thickening or pericholecystic inflammation is seen. No visible calcified gallstones or biliary ductal dilatation. Pancreas: Slightly edematous appearance of the pancreatic  parenchyma with very faint peripancreatic haze, uniform enhancement. No pancreatic ductal dilatation. Spleen: Mild splenomegaly.  No concerning focal splenic lesion. Adrenals/Urinary Tract: Normal adrenals. Kidneys enhance symmetrically. Symmetric and uniform excretion. No concerning focal renal lesion. Bilateral renal cortical scarring. Nonobstructing calculi present in both kidneys. No obstructive urolithiasis or hydronephrosis is seen at this time. Bladder is unremarkable for the degree of distention. Stomach/Bowel: Paraesophageal venous collaterals and varices. Some minimal collateralization and varices about the gastric fundus as well. Thickening towards the gastric antrum likely reflecting normal peristalsis. Air and fluid-filled appearance of the duodenum with a normal sweep across the midline abdomen. Slightly edematous appearance diffusely through the small bowel is nonspecific given additional findings above. Moderate right-sided colonic stool burden, particularly within the cecum. Some mild circumferential thickening and mucosal hyperemia about the cecum and ascending colon is nonspecific given hepatic features. No evidence of mechanical obstruction. Appendix is not well visualized. Vascular/Lymphatic: Atherosclerotic calcifications within the abdominal aorta and branch vessels. No aneurysm or ectasia. Paraesophageal and gastric varices with venous collaterals, as above. Splenorenal collateralization. Upper abdominal varices.No enlarged abdominopelvic lymph nodes. Few scattered calcified lymph nodes are present in the abdomen and pelvis. Reproductive: Portion the pelvis obscured by streak artifact. 2.6 cm cystic focus in the left adnexa, unchanged from comparison. No concerning right adnexal lesion. Prior hysterectomy. Other: Increasing edematous changes of the central mesentery. Small volume low-attenuation fluid in the deep pelvis as well as trace perihepatic ascites. Circumferential body wall edema. No  bowel containing hernia. No free abdominopelvic air. Musculoskeletal: Multilevel degenerative changes are present in the imaged portions of the spine. Grade 1 anterolisthesis L4 on L5 without spondylolysis, unchanged from prior. Prior left hip arthroplasty with chronic bony remodeling and a similar appearance overall to comparison prior. IMPRESSION: Constellation of features suggestive of intrinsic liver disease/cirrhosis with features of portal hypertension including a nodular, heterogeneous liver with paraesophageal and gastric varices, additional upper abdominal venous collateralization, splenorenal shunting and splenomegaly. Interval development of a small volume ascites as well as additional features of at least mild or early developing anasarca with circumferential body wall edema and central mesenteric edema. Some hazy peripancreatic stranding may be present, possibly related to a diffusely edematous appearance of the mesentery though could correlate with lipase. Mildly thickened appearance of the cecum and ascending colon as well as a diffusely edematous appearance of the small bowel is nonspecific in the setting of suspected intrinsic liver disease. Could reflect portal enteropathy/colopathy though should exclude clinical symptoms of enterocolitis. Bilateral nonobstructing nephrolithiasis. No obstructive urolithiasis or hydronephrosis at this time. Bilateral renal cortical scarring. Prior hysterectomy. Stable 2.7 cm left ovarian cyst. Continued stability favoring a benign process. No follow-up imaging recommended. Note: This recommendation  does not apply to premenarchal patients and to those with increased risk (genetic, family history, elevated tumor markers or other high-risk factors) of ovarian cancer. Reference: JACR 2020 Feb; 17(2):248-254 Aortic Atherosclerosis (ICD10-I70.0). Electronically Signed   By: Lovena Le M.D.   On: 05/13/2020 00:35   US Abdomen Limited  Result Date: 05/13/2020 CLINICAL  DATA:  Right upper quadrant pain. EXAM: ULTRASOUND ABDOMEN LIMITED RIGHT UPPER QUADRANT COMPARISON:  February 23, 2020 ultrasound.  CT scan May 12, 2020. FINDINGS: Gallbladder: A small amount of sludge is seen in the gallbladder. No stones, Murphy's sign, or wall thickening. A small amount of ascites is identified, including adjacent to the gallbladder. Common bile duct: Diameter: 5 mm Liver: There is a nodular contour in the liver. No focal mass. Portal vein is patent on color Doppler imaging with normal direction of blood flow towards the liver. Other: Mild ascites. IMPRESSION: 1. Nodular contour to the liver suggesting cirrhosis.  Mild ascites. 2. Mild sludge in otherwise normal appearing gallbladder. A small amount of fluid adjacent to the gallbladder is likely due to the ascites. Electronically Signed   By: Dorise Bullion III M.D   On: 05/13/2020 09:06   US Paracentesis  Result Date: 05/14/2020 INDICATION: Fever, abdominal pain, question spontaneous bacterial peritonitis, ascites on CT EXAM: ULTRASOUND GUIDED DIAGNOSTIC PARACENTESIS MEDICATIONS: None COMPLICATIONS: None immediate PROCEDURE: Informed written consent was obtained from the patient after a discussion of the risks, benefits and alternatives to treatment. A timeout was performed prior to the initiation of the procedure. Initial ultrasound scanning demonstrates a small amount of ascites within the right lower abdominal quadrant. The right lower abdomen was prepped and draped in the usual sterile fashion. 1% lidocaine was used for local anesthesia. Following this, a 5 [place] Yueh catheter was introduced. An ultrasound image was saved for documentation purposes. The paracentesis was performed. The catheter was removed and a dressing was applied. The patient tolerated the procedure well without immediate post procedural complication. FINDINGS: A total of approximately 20 mL of clear yellow ascitic fluid was removed. Samples were sent to the  laboratory as requested by the clinical team. IMPRESSION: Successful ultrasound-guided paracentesis yielding 20 mL of peritoneal fluid for diagnostic testing. Electronically Signed   By: Lavonia Dana M.D.   On: 05/14/2020 12:39   DG CHEST PORT 1 VIEW  Result Date: 05/24/2020 CLINICAL DATA:  55 year old female with tachycardia. EXAM: PORTABLE CHEST 1 VIEW COMPARISON:  Chest radiograph dated 05/22/2020. FINDINGS: Right-sided PICC with tip at the cavoatrial junction. No interval change in bilateral interstitial reticulonodular densities. No pleural effusion or pneumothorax. Stable cardiomediastinal silhouette. No acute osseous pathology. IMPRESSION: No interval change in the bilateral interstitial reticulonodular densities. Electronically Signed   By: Anner Crete M.D.   On: 05/24/2020 01:33   DG CHEST PORT 1 VIEW  Result Date: 05/22/2020 CLINICAL DATA:  Cough EXAM: PORTABLE CHEST 1 VIEW COMPARISON:  Chest radiograph dated 05/20/2020. FINDINGS: The heart size and mediastinal contours are within normal limits. Moderate bilateral interstitial and airspace opacities appear unchanged since prior exam. A right upper extremity peripherally inserted central venous catheter tip overlies the superior cavoatrial junction. IMPRESSION: Moderate bilateral interstitial and airspace opacities are not significantly changed and likely represent pulmonary edema. Electronically Signed   By: Zerita Boers M.D.   On: 05/22/2020 16:46   DG CHEST PORT 1 VIEW  Result Date: 05/20/2020 CLINICAL DATA:  Pulmonary edema EXAM: PORTABLE CHEST 1 VIEW COMPARISON:  05/16/2020 chest radiograph. FINDINGS: Right PICC terminates over the right  atrium. Stable cardiomediastinal silhouette with normal heart size. No pneumothorax. No pleural effusion. Patchy hazy opacities throughout both lungs, significantly improved. IMPRESSION: Significantly improved patchy hazy opacities throughout both lungs, compatible with improving pulmonary edema.  Electronically Signed   By: Ilona Sorrel M.D.   On: 05/20/2020 07:32   DG CHEST PORT 1 VIEW  Result Date: 05/16/2020 CLINICAL DATA:  Shortness of breath. EXAM: PORTABLE CHEST 1 VIEW COMPARISON:  Chest x-ray dated May 12, 2020. FINDINGS: The heart size and mediastinal contours are within normal limits. Prominent diffuse interstitial thickening with bilateral perihilar opacities. Small bilateral pleural effusions. No pneumothorax. No acute osseous abnormality. IMPRESSION: 1. Moderate to severe pulmonary edema with small bilateral pleural effusions. Electronically Signed   By: Titus Dubin M.D.   On: 05/16/2020 12:57   DG Chest Portable 1 View  Result Date: 05/12/2020 CLINICAL DATA:  Right sided rib pain. EXAM: PORTABLE CHEST 1 VIEW COMPARISON:  February 12, 2020 FINDINGS: A very mild amount of linear atelectasis is seen within the right lung base. There is no evidence of acute infiltrate, pleural effusion or pneumothorax. The heart size and mediastinal contours are within normal limits. A chronic seventh right rib fracture is seen. IMPRESSION: 1. Very mild right basilar linear atelectasis. 2. Chronic seventh right rib fracture. Electronically Signed   By: Virgina Norfolk M.D.   On: 05/12/2020 23:16   DG Swallowing Func-Speech Pathology  Result Date: 05/25/2020 Objective Swallowing Evaluation: Type of Study: MBS-Modified Barium Swallow Study  Patient Details Name: AMORINA EDSALL MRN: [id number] Date of Birth: 10/08/1965 Today's Date: 05/25/2020 Time: SLP Start Time (ACUTE ONLY): 1436 -SLP Stop Time (ACUTE ONLY): 1505 SLP Time Calculation (min) (ACUTE ONLY): 29 min Past Medical History: Past Medical History: Diagnosis Date . Arthritis   "knees" (04/22/2012) . Brain tumor (benign) ([place])  . Chronic bronchitis ([place])   "yearly; when the weather changes" (04/22/2012) . Colon polyp  . Colon polyps   adenomatous and hyperplastic- . Depression  . Eczema  . Epilepsy ([place])   "been having them right often here lately"  (04/22/2012) . Fatty liver  . High cholesterol  . History of kidney stones  . Osteoarthritis   [name] 04/22/2012 . Other convulsions 05/21/12  non-epileptic spells . Restless leg  . Seizures ([place])  . Vertigo  Past Surgical History: Past Surgical History: Procedure Laterality Date . ABDOMINAL HYSTERECTOMY  2001 . BLADDER SUSPENSION   . BUNIONECTOMY Left 2000 . CESAREAN SECTION  1987; 1988 . COLONOSCOPY   . JOINT REPLACEMENT   . MASS EXCISION  10/22/2011  Procedure: EXCISION MASS;  Surgeon: [name], MD;  Location: [facility];  Service: General;  Laterality: Right;  excision right buttock mass . TEE WITHOUT CARDIOVERSION N/A 05/16/2020  Procedure: TRANSESOPHAGEAL ECHOCARDIOGRAM (TEE) WITH PROPOFOL;  Surgeon: Arnoldo Lenis, MD;  Location: [facility];  Service: Endoscopy;  Laterality: N/A; . TOTAL HIP ARTHROPLASTY Left 1993; 1995; 2000 . TOTAL KNEE ARTHROPLASTY Left 05/07/2015  Procedure: TOTAL LEFT KNEE ARTHROPLASTY;  Surgeon: [name], MD;  Location: [facility];  Service: Orthopedics;  Laterality: Left; . TOTAL KNEE ARTHROPLASTY Right 10/01/2015  Procedure: RIGHT TOTAL KNEE ARTHROPLASTY;  Surgeon: [name], MD;  Location: [facility];  Service: Orthopedics;  Laterality: Right; HPI: 55 year old female with a history of COPD, meningioma, cardiac arrest December 2020, hepatic steatosis, PNES, cognitive impairment, depression, complex partial seizure presenting with generalized weakness and a fall.  This has been a recurrent problem for the patient.  She was admitted on 2/7 to 02/24/20  with a similar presentation.  At that time she was treated for acute metabolic encephalopathy which was thought to be multifactorial including elevated ammonia, dehydration and dopamine agonist.  BSE requested  No data recorded Assessment / Plan / Recommendation CHL IP CLINICAL IMPRESSIONS 05/25/2020 Clinical Impression Pt presents with moderate oropharyngeal dysphagia characterized by silent aspiration of thin liquids; amount of aspiration is  dependent on presentation type and bolus size. Pt demonstrates premature spillage, decreased epiglottic deflection, decreased laryngeal vestibule closure and decreased pharyngeal squeeze. Tsp sips of thin liquids result in trace flash penetration, cup sips result in penetration with trace amounts falling to the cords that are not always visualized being cleared from the laryngeal vestibule; straw sips of thin result in moderate amounts falling to the cords and eventually being aspirated. Pt also demonstrated a moderate amount of aspiration of a cup sip of thin with the barium tablet administration (this unfortunately occurred prior to floro being turned on, however evidence of aspiration present on the tracheal wall); All aspiration was silent and required verbal cues for Pt to cough. Further, cough was inconsitently effective in clearing aspirates. With NTL Pt demonstrated occasional flash penetration and puree and regular textures were consumed without incident. Recommend continue with a D2/fine chop diet and NTL. Pt is cognitively unable to consistently utilize strategies. SLP will continue to follow acutely SLP Visit Diagnosis Dysphagia, unspecified (R13.10) Attention and concentration deficit following -- Frontal lobe and executive function deficit following -- Impact on safety and function Mild aspiration risk;Moderate aspiration risk   CHL IP TREATMENT RECOMMENDATION 05/25/2020 Treatment Recommendations F/U MBS in --- days (Comment);Therapy as outlined in treatment plan below   Prognosis 05/25/2020 Prognosis for Safe Diet Advancement Good Barriers to Reach Goals Cognitive deficits Barriers/Prognosis Comment -- CHL IP DIET RECOMMENDATION 05/25/2020 SLP Diet Recommendations Dysphagia 2 (Fine chop) solids;Nectar thick liquid Liquid Administration via Cup;Straw Medication Administration Whole meds with puree Compensations Minimize environmental distractions;Slow rate;Small sips/bites Postural Changes Seated upright  at 90 degrees   CHL IP OTHER RECOMMENDATIONS 05/25/2020 Recommended Consults -- Oral Care Recommendations Oral care BID Other Recommendations Order thickener from pharmacy   CHL IP FOLLOW UP RECOMMENDATIONS 05/25/2020 Follow up Recommendations 24 hour supervision/assistance   CHL IP FREQUENCY AND DURATION 05/25/2020 Speech Therapy Frequency (ACUTE ONLY) min 1 x/week Treatment Duration 1 week      CHL IP ORAL PHASE 05/25/2020 Oral Phase Impaired Oral - Pudding Teaspoon -- Oral - Pudding Cup -- Oral - Honey Teaspoon -- Oral - Honey Cup -- Oral - Nectar Teaspoon Premature spillage;Decreased bolus cohesion;Piecemeal swallowing Oral - Nectar Cup Premature spillage;Decreased bolus cohesion;Piecemeal swallowing Oral - Nectar Straw NT Oral - Thin Teaspoon Decreased bolus cohesion;Premature spillage;Piecemeal swallowing Oral - Thin Cup Premature spillage;Decreased bolus cohesion;Piecemeal swallowing Oral - Thin Straw Premature spillage;Decreased bolus cohesion;Piecemeal swallowing Oral - Puree WFL Oral - Mech Soft -- Oral - Regular Piecemeal swallowing Oral - Multi-Consistency -- Oral - Pill (No Data) Oral Phase - Comment --  CHL IP PHARYNGEAL PHASE 05/25/2020 Pharyngeal Phase Impaired Pharyngeal- Pudding Teaspoon -- Pharyngeal -- Pharyngeal- Pudding Cup -- Pharyngeal -- Pharyngeal- Honey Teaspoon -- Pharyngeal -- Pharyngeal- Honey Cup -- Pharyngeal -- Pharyngeal- Nectar Teaspoon NT Pharyngeal -- Pharyngeal- Nectar Cup Pharyngeal residue - valleculae;Pharyngeal residue - pyriform;Penetration/Aspiration during swallow Pharyngeal Material enters airway, remains ABOVE vocal cords then ejected out Pharyngeal- Nectar Straw NT Pharyngeal -- Pharyngeal- Thin Teaspoon Pharyngeal residue - valleculae;Pharyngeal residue - pyriform;Penetration/Aspiration during swallow;Penetration/Aspiration before swallow;Trace aspiration;Reduced epiglottic inversion;Delayed swallow initiation-pyriform sinuses;Reduced pharyngeal peristalsis;Reduced  airway/laryngeal  closure Pharyngeal Material enters airway, passes BELOW cords without attempt by patient to eject out (silent aspiration) Pharyngeal- Thin Cup Pharyngeal residue - valleculae;Pharyngeal residue - pyriform;Penetration/Aspiration during swallow;Penetration/Aspiration before swallow;Trace aspiration;Reduced epiglottic inversion;Delayed swallow initiation-pyriform sinuses;Reduced pharyngeal peristalsis;Reduced airway/laryngeal closure Pharyngeal Material enters airway, passes BELOW cords without attempt by patient to eject out (silent aspiration) Pharyngeal- Thin Straw Pharyngeal residue - valleculae;Pharyngeal residue - pyriform;Penetration/Aspiration during swallow;Penetration/Aspiration before swallow;Reduced epiglottic inversion;Delayed swallow initiation-pyriform sinuses;Reduced pharyngeal peristalsis;Reduced airway/laryngeal closure;Moderate aspiration Pharyngeal Material enters airway, passes BELOW cords without attempt by patient to eject out (silent aspiration) Pharyngeal- Puree Pharyngeal residue - valleculae Pharyngeal -- Pharyngeal- Mechanical Soft NT Pharyngeal -- Pharyngeal- Regular Pharyngeal residue - valleculae Pharyngeal -- Pharyngeal- Multi-consistency -- Pharyngeal -- Pharyngeal- Pill -- Pharyngeal -- Pharyngeal Comment --  CHL IP CERVICAL ESOPHAGEAL PHASE 05/25/2020 Cervical Esophageal Phase WFL Pudding Teaspoon -- Pudding Cup -- Honey Teaspoon -- Honey Cup -- Nectar Teaspoon -- Nectar Cup -- Nectar Straw -- Thin Teaspoon -- Thin Cup -- Thin Straw -- Puree -- Mechanical Soft -- Regular -- Multi-consistency -- Pill -- Cervical Esophageal Comment -- Amelia H. Roddie Mc, CCC-SLP Speech Language Pathologist Wende Bushy 05/25/2020, 4:05 PM              US ABDOMEN COMPLETE W/ELASTOGRAPHY  Result Date: 05/03/2020 CLINICAL DATA:  Fatty liver EXAM: ULTRASOUND ABDOMEN ULTRASOUND HEPATIC ELASTOGRAPHY TECHNIQUE: Sonography of the upper abdomen was performed. In addition, ultrasound  elastography evaluation of the liver was performed. A region of interest was placed within the right lobe of the liver. Following application of a compressive sonographic pulse, tissue compressibility was assessed. Multiple assessments were performed at the selected site. Median tissue compressibility was determined. Previously, hepatic stiffness was assessed by shear wave velocity. Based on recently published Society of Radiologists in Ultrasound consensus article, reporting is now recommended to be performed in the SI units of pressure (kiloPascals) representing hepatic stiffness/elasticity. The obtained result is compared to the published reference standards. (cACLD = compensated Advanced Chronic Liver Disease) COMPARISON:  02/23/2020 FINDINGS: ULTRASOUND ABDOMEN Gallbladder: Gallbladder partially contracted without evidence of gallstones or sonographic Murphy sign. Small amount of internal sludge is present. No wall thickening. Common bile duct: Diameter: 6 mm upper normal Liver: Echogenic hepatic parenchyma with nodular contour consistent with cirrhosis. No discrete hepatic mass. Portal vein is patent on color Doppler imaging with normal direction of blood flow towards the liver. IVC: Normal appearance Pancreas: Normal appearance Spleen: 11.9 cm length, calculated volume 399 mL.  No focal mass. Right Kidney: Length: 11.9 cm. Normal morphology without mass or hydronephrosis. Left Kidney: Length: 12.0 cm. Normal morphology without mass or hydronephrosis. Abdominal aorta: Normal caliber Other findings: Scattered ascites. ULTRASOUND HEPATIC ELASTOGRAPHY Device: Siemens Helix VTQ Patient position: Not recorded Transducer 5C1 Number of measurements: 10 Hepatic segment:  8 Median kPa: 95.8 IQR: 11.6 IQR/Median kPa ratio: 0.1 Data quality:  Good Diagnostic category: > or =17 kPa: highly suggestive of cACLD with an increased probability of clinically significant portal hypertension The use of hepatic elastography is  applicable to patients with viral hepatitis and non-alcoholic fatty liver disease. At this time, there is insufficient data for the referenced cut-off values and use in other causes of liver disease, including alcoholic liver disease. Patients, however, may be assessed by elastography and serve as their own reference standard/baseline. In patients with non-alcoholic liver disease, the values suggesting compensated advanced chronic liver disease (cACLD) may be lower, and patients may need additional testing with elasticity results of 7-9 kPa. Please note  that abnormal hepatic elasticity and shear wave velocities may also be identified in clinical settings other than with hepatic fibrosis, such as: acute hepatitis, elevated right heart and central venous pressures including use of beta blockers, veno-occlusive disease (Budd-Chiari), infiltrative processes such as mastocytosis/amyloidosis/infiltrative tumor/lymphoma, extrahepatic cholestasis, with hyperemia in the post-prandial state, and with liver transplantation. Correlation with patient history, laboratory data, and clinical condition recommended. Diagnostic Categories: < or =5 kPa: high probability of being normal < or =9 kPa: in the absence of other known clinical signs, rules out cACLD >9 kPa and ?13 kPa: suggestive of cACLD, but needs further testing >13 kPa: highly suggestive of cACLD > or =17 kPa: highly suggestive of cACLD with an increased probability of clinically significant portal hypertension IMPRESSION: ULTRASOUND ABDOMEN: Cirrhotic appearing liver without focal mass. ULTRASOUND HEPATIC ELASTOGRAPHY: Median kPa:  95.8 Diagnostic category: > or =17 kPa: highly suggestive of cACLD with an increased probability of clinically significant portal hypertension Electronically Signed   By: Lavonia Dana M.D.   On: 05/03/2020 17:09   ECHOCARDIOGRAM COMPLETE  Result Date: 05/15/2020    ECHOCARDIOGRAM REPORT   Patient Name:   PHILISHA LILEY Date of Exam:  05/15/2020 Medical Rec #:  [id number]       Height:       60.0 in Accession #:    [id number]      Weight:       127.9 lb Date of Birth:  03-03-1965        BSA:          1.543 m Patient Age:    59 years        BP:           88/64 mmHg Patient Gender: F               HR:           95 bpm. Exam Location:  [facility] Procedure: 2D Echo, Cardiac Doppler and Color Doppler Indications:    Bacteremia R78.81  History:        Patient has prior history of Echocardiogram examinations, most                 recent 02/23/2020. Risk Factors:Dyslipidemia. Cardiac Arrest,                 Elevated Liver Function Test.  Sonographer:    Alvino Chapel RCS Referring Phys: [id number] [place]  1. Left ventricular ejection fraction, by estimation, is 60 to 65%. The left ventricle has normal function. The left ventricle has no regional wall motion abnormalities. Left ventricular diastolic parameters are indeterminate.  2. Right ventricular systolic function is normal. The right ventricular size is normal.  3. Left atrial size was moderately dilated.  4. The mitral valve is normal in structure. No evidence of mitral valve regurgitation. No evidence of mitral stenosis.  5. The aortic valve has an indeterminant number of cusps. Aortic valve regurgitation is not visualized. No aortic stenosis is present.  6. The inferior vena cava is normal in size with greater than 50% respiratory variability, suggesting right atrial pressure of 3 mmHg. FINDINGS  Left Ventricle: Left ventricular ejection fraction, by estimation, is 60 to 65%. The left ventricle has normal function. The left ventricle has no regional wall motion abnormalities. The left ventricular internal cavity size was normal in size. There is  no left ventricular hypertrophy. Left ventricular diastolic parameters are indeterminate. Right Ventricle: The right ventricular size is normal. No increase  in right ventricular wall thickness. Right ventricular systolic function is  normal. Left Atrium: Left atrial size was moderately dilated. Right Atrium: Right atrial size was not well visualized. Pericardium: There is no evidence of pericardial effusion. Mitral Valve: The mitral valve is normal in structure. No evidence of mitral valve regurgitation. No evidence of mitral valve stenosis. Tricuspid Valve: The tricuspid valve is normal in structure. Tricuspid valve regurgitation is not demonstrated. No evidence of tricuspid stenosis. Aortic Valve: The aortic valve has an indeterminant number of cusps. Aortic valve regurgitation is not visualized. No aortic stenosis is present. Aortic valve mean gradient measures 5.1 mmHg. Aortic valve peak gradient measures 8.2 mmHg. Aortic valve area, by VTI measures 2.13 cm. Pulmonic Valve: The pulmonic valve was not well visualized. Pulmonic valve regurgitation is not visualized. No evidence of pulmonic stenosis. Aorta: The aortic root is normal in size and structure. Pulmonary Artery: Inadequate TR jet, indeterminant PASP. Venous: The inferior vena cava is normal in size with greater than 50% respiratory variability, suggesting right atrial pressure of 3 mmHg. IAS/Shunts: No atrial level shunt detected by color flow Doppler.  LEFT VENTRICLE PLAX 2D LVIDd:         4.80 cm  Diastology LVIDs:         3.10 cm  LV e' medial:    10.60 cm/s LV PW:         1.00 cm  LV E/e' medial:  11.3 LV IVS:        1.00 cm  LV e' lateral:   9.14 cm/s LVOT diam:     1.80 cm  LV E/e' lateral: 13.1 LV SV:         64 LV SV Index:   41 LVOT Area:     2.54 cm  RIGHT VENTRICLE RV S prime:     16.30 cm/s TAPSE (M-mode): 2.6 cm LEFT ATRIUM             Index       RIGHT ATRIUM           Index LA diam:        4.20 cm 2.72 cm/m  RA Area:     14.20 cm LA Vol (A2C):   53.8 ml 34.86 ml/m RA Volume:   36.10 ml  23.39 ml/m LA Vol (A4C):   64.2 ml 41.60 ml/m LA Biplane Vol: 62.5 ml 40.49 ml/m  AORTIC VALVE AV Area (Vmax):    2.40 cm AV Area (Vmean):   2.00 cm AV Area (VTI):     2.13  cm AV Vmax:           143.34 cm/s AV Vmean:          109.425 cm/s AV VTI:            0.301 m AV Peak Grad:      8.2 mmHg AV Mean Grad:      5.1 mmHg LVOT Vmax:         135.00 cm/s LVOT Vmean:        86.200 cm/s LVOT VTI:          0.251 m LVOT/AV VTI ratio: 0.84  AORTA Ao Root diam: 3.00 cm MITRAL VALVE MV Area (PHT): 3.97 cm     SHUNTS MV Decel Time: 191 msec     Systemic VTI:  0.25 m MV E velocity: 120.00 cm/s  Systemic Diam: 1.80 cm MV A velocity: 87.30 cm/s MV E/A ratio:  1.37 Carlyle Dolly MD Electronically signed by Roderic Palau  Branch MD Signature Date/Time: 05/15/2020/2:16:14 PM    Final    ECHO TEE  Result Date: 05/16/2020    TRANSESOPHOGEAL ECHO REPORT   Patient Name:   ROSETTE SUTHERLIN Date of Exam: 05/16/2020 Medical Rec #:  [id number]       Height:       60.0 in Accession #:    [id number]      Weight:       127.9 lb Date of Birth:  12/14/1965        BSA:          1.543 m Patient Age:    22 years        BP:           123/85 mmHg Patient Gender: F               HR:           101 bpm. Exam Location:  [facility] Procedure: Transesophageal Echo, Cardiac Doppler and Color Doppler Indications:    Bacteremia  History:        Patient has prior history of Echocardiogram examinations, most                 recent 05/15/2020. Risk Factors:Dyslipidemia. Cardiac Arrest,                 Elevated Liver Function Test.  Sonographer:    Alvino Chapel RCS Referring Phys: [id number] [place]: The transesophogeal probe was passed without difficulty through the esophogus of the patient. Sedation performed by different physician. The patient developed no complications during the procedure. IMPRESSIONS  1. Left ventricular ejection fraction, by estimation, is 60 to 65%. The left ventricle has normal function.  2. Right ventricular systolic function is normal. The right ventricular size is normal.  3. Left atrial size was mildly dilated. No left atrial/left atrial appendage thrombus was detected. The LAA emptying  velocity was 85 cm/s.  4. Right atrial size was mildly dilated.  5. The mitral valve is normal in structure. Trivial mitral valve regurgitation. No evidence of mitral stenosis.  6. The aortic valve is tricuspid. Aortic valve regurgitation is not visualized. No aortic stenosis is present. Conclusion(s)/Recommendation(s): No evidence of vegetation/infective endocarditis on this transesophageal echocardiogram. FINDINGS  Left Ventricle: Left ventricular ejection fraction, by estimation, is 60 to 65%. The left ventricle has normal function. The left ventricular internal cavity size was normal in size. Right Ventricle: The right ventricular size is normal. No increase in right ventricular wall thickness. Right ventricular systolic function is normal. Left Atrium: Left atrial size was mildly dilated. No left atrial/left atrial appendage thrombus was detected. The LAA emptying velocity was 85 cm/s. Right Atrium: Right atrial size was mildly dilated. Pericardium: There is no evidence of pericardial effusion. Mitral Valve: The mitral valve is normal in structure. Trivial mitral valve regurgitation. No evidence of mitral valve stenosis. Tricuspid Valve: The tricuspid valve is normal in structure. Tricuspid valve regurgitation is trivial. No evidence of tricuspid stenosis. Aortic Valve: The aortic valve is tricuspid. Aortic valve regurgitation is not visualized. No aortic stenosis is present. Pulmonic Valve: The pulmonic valve was normal in structure. Pulmonic valve regurgitation is not visualized. No evidence of pulmonic stenosis. Aorta: The aortic root is normal in size and structure. IAS/Shunts: No atrial level shunt detected by color flow Doppler. Carlyle Dolly MD Electronically signed by Carlyle Dolly MD Signature Date/Time: 05/16/2020/1:28:24 PM    Final    [name] ASCITES (ABDOMEN LIMITED)  Result  Date: 05/14/2020 CLINICAL DATA:  Ascites, question sufficient for paracentesis, clinical concern for spontaneous bacterial  peritonitis EXAM: LIMITED ABDOMEN ULTRASOUND FOR ASCITES TECHNIQUE: Limited ultrasound survey for ascites was performed in all four abdominal quadrants. COMPARISON:  CT abdomen and pelvis 05/13/2020 FINDINGS: Small volume ascites identified in the lower quadrants bilaterally. Volume of ascites has probably slightly increased since prior CT. While the volume of fluid is insufficient for therapeutic paracentesis, a small volume of ascites may be obtainable for diagnostic purposes. Diagnostic paracentesis will be attempted. IMPRESSION: Small volume ascites, likely sufficient volume for diagnostic paracentesis. Electronically Signed   By: Lavonia Dana M.D.   On: 05/14/2020 11:11   [name] EKG SITE RITE  Result Date: 05/17/2020 If Site Rite image not attached, placement could not be confirmed due to current cardiac rhythm.   Barton Dubois, MD  [facility]  If 7PM-7AM, please contact night-coverage [email or web address] Password [facility] 05/25/2020, 5:37 PM   LOS: 12 days

## 2020-05-25 NOTE — Evaluation (Addendum)
Clinical/Bedside Swallow Evaluation Patient Details  Name: [name] MRN: [id number] Date of Birth: [birth year]  Today's Date: 05/25/2020 Time: SLP Start Time (ACUTE ONLY): 3664 SLP Stop Time (ACUTE ONLY): 0926 SLP Time Calculation (min) (ACUTE ONLY): 44 min  Past Medical History:  Past Medical History:  Diagnosis Date  . Arthritis    "knees" (04/22/2012)  . Brain tumor (benign) ([place])   . Chronic bronchitis ([place])    "yearly; when the weather changes" (04/22/2012)  . Colon polyp   . Colon polyps    adenomatous and hyperplastic-  . Depression   . Eczema   . Epilepsy ([place])    "been having them right often here lately" (04/22/2012)  . Fatty liver   . High cholesterol   . History of kidney stones   . Osteoarthritis    [name] 04/22/2012  . Other convulsions 05/21/12   non-epileptic spells  . Restless leg   . Seizures ([place])   . Vertigo    Past Surgical History:  Past Surgical History:  Procedure Laterality Date  . ABDOMINAL HYSTERECTOMY  2001  . BLADDER SUSPENSION    . BUNIONECTOMY Left 2000  . CESAREAN SECTION  1987; 1988  . COLONOSCOPY    . JOINT REPLACEMENT    . MASS EXCISION  10/22/2011   Procedure: EXCISION MASS;  Surgeon: [name], MD;  Location: [facility];  Service: General;  Laterality: Right;  excision right buttock mass  . TEE WITHOUT CARDIOVERSION N/A 05/16/2020   Procedure: TRANSESOPHAGEAL ECHOCARDIOGRAM (TEE) WITH PROPOFOL;  Surgeon: Arnoldo Lenis, MD;  Location: [facility];  Service: Endoscopy;  Laterality: N/A;  . TOTAL HIP ARTHROPLASTY Left 1993; 1995; 2000  . TOTAL KNEE ARTHROPLASTY Left 05/07/2015   Procedure: TOTAL LEFT KNEE ARTHROPLASTY;  Surgeon: [name], MD;  Location: [facility];  Service: Orthopedics;  Laterality: Left;  . TOTAL KNEE ARTHROPLASTY Right 10/01/2015   Procedure: RIGHT TOTAL KNEE ARTHROPLASTY;  Surgeon: [name], MD;  Location: [facility];  Service: Orthopedics;  Laterality: Right;   HPI:  55 year old female with a history of COPD,  meningioma, cardiac arrest December 2020, hepatic steatosis, PNES, cognitive impairment, depression, complex partial seizure presenting with generalized weakness and a fall.  This has been a recurrent problem for the patient.  She was admitted on 2/7 to 02/24/20 with a similar presentation.  At that time she was treated for acute metabolic encephalopathy which was thought to be multifactorial including elevated ammonia, dehydration and dopamine agonist.  BSE requested   Assessment / Plan / Recommendation Clinical Impression  Clinical swallowing evaluation completed while Pt was sitting upright in bed; Pt was pleasantly confused and note edentulous status (she reports her dentures are not here at the hospital). Pt demonstrated delayed throat clearing, occasional immediate coughing, wet vocal quality and multiple swallows with thin liquid trials.  Pt consumed trials of NTL without any overt s/sx of aspiration. Pt consumed regular trials with prolonged oral prep and mild oral residue after the swallow. Recommend continue with D2/fine chop diet and NECTAR thick liquids. ST will attempt to complete MBSS later today as schedule permits in order to objectively assess the swallowing function. Thank you, SLP Visit Diagnosis: Dysphagia, unspecified (R13.10)    Aspiration Risk  Mild aspiration risk;Moderate aspiration risk    Diet Recommendation Dysphagia 2 (Fine chop);Nectar-thick liquid   Liquid Administration via: Cup;Straw Medication Administration: Crushed with puree Supervision: Full supervision/cueing for compensatory strategies Compensations: Minimize environmental distractions;Slow rate;Small sips/bites Postural Changes: Seated upright at 90 degrees  Other  Recommendations Oral Care Recommendations: Oral care BID Other Recommendations: Order thickener from pharmacy   Follow up Recommendations 24 hour supervision/assistance      Frequency and Duration min 1 x/week  1 week       Prognosis  Prognosis for Safe Diet Advancement: Good Barriers to Reach Goals: Cognitive deficits      Swallow Study   General Date of Onset: 05/12/20 HPI: 55 year old female with a history of COPD, meningioma, cardiac arrest December 2020, hepatic steatosis, PNES, cognitive impairment, depression, complex partial seizure presenting with generalized weakness and a fall.  This has been a recurrent problem for the patient.  She was admitted on 2/7 to 02/24/20 with a similar presentation.  At that time she was treated for acute metabolic encephalopathy which was thought to be multifactorial including elevated ammonia, dehydration and dopamine agonist.  BSE requested Type of Study: Bedside Swallow Evaluation Previous Swallow Assessment: BSE 2017 - no needs identified Diet Prior to this Study: Dysphagia 2 (chopped);Nectar-thick liquids Temperature Spikes Noted: No History of Recent Intubation: No Behavior/Cognition: Alert;Cooperative;Confused Oral Cavity Assessment: Within Functional Limits Oral Care Completed by SLP: Yes Oral Cavity - Dentition: Edentulous Self-Feeding Abilities: Needs assist;Total assist Patient Positioning: Upright in bed Baseline Vocal Quality: Normal Volitional Cough: Strong Volitional Swallow: Able to elicit    Oral/Motor/Sensory Function Overall Oral Motor/Sensory Function: Within functional limits   Ice Chips Ice chips: Within functional limits   Thin Liquid Thin Liquid: Impaired Presentation: Cup;Straw Pharyngeal  Phase Impairments: Suspected delayed Swallow;Multiple swallows;Wet Vocal Quality;Cough - Immediate;Throat Clearing - Delayed    Nectar Thick Nectar Thick Liquid: Within functional limits   Honey Thick Honey Thick Liquid: Not tested   Puree Puree: Within functional limits   Solid     Solid: Impaired Presentation: Spoon Oral Phase Functional Implications: Prolonged oral transit Pharyngeal Phase Impairments: Suspected delayed Swallow;Multiple swallows      Monica Fera H. Roddie Mc, CCC-SLP Speech Language Pathologist  [name] 05/25/2020,11:19 AM

## 2020-05-25 NOTE — Progress Notes (Signed)
Physical Therapy Treatment Patient Details Name: [name] MRN: [id number] DOB: Oct 11, 1965 Today's Date: 05/25/2020    History of Present Illness [name]  is a 55 y.o. female, with history of seizures, mood disorder, benign brain tumor, fatty liver, hypercholesterol, and more presents the ED with a chief complaint of fall.  Patient reports that she did prior to presentation.  She reports that her house is being remodeled and she tripped on something and fell forward hit her head on the railing and landed on her right chest.  She reports that she hit the back of her head on the way down.  She had no loss of consciousness.  She has had no change in vision or hearing.  She reports that prior to the fall she did have dizziness, chest pain, nausea, but these are chronic daily symptoms for her.  She did not have palpitations.  Her main complaint at this time is that the right side of her chest hurts.  It is worse with deep breaths and with position changes.  She tried Tylenol at home with no relief.  She does admit to having fever prior to coming to the ED today.  She reports a T-max at home of 103.  At presentation her temperature was 101.  She denies cough.  She admits to urinary frequency but no dysuria or urgency.  She has been constipated but did take a laxative yesterday with adequate response.  She reports she had a UTI a month ago and had antibiotics for it and the symptoms cleared up.  She has no rashes or open sores on her body.  She reports nausea and vomiting x3 today prior to coming in on the ambulance.  There is nonbloody emesis.  She has no other complaints at this time.    PT Comments    Patient with eyes closed initially but does respond and arouses with mobility. Patient requires mod/max assist to transition to seated EOB. Initially she requires increased support for seated balance but is able to maintain balance for several minutes without assist with cueing for UE support. Balance  becomes impaired with increasing fatigue. Patient with continued extensor tone with transfer to standing which limits standing ability as she requires mod/max assist to transfer to standing and remain standing. Patient assisted back to supine at end of session and is left with nursing to clean up patient. Patient will benefit from continued physical therapy in hospital and recommended venue below to increase strength, balance, endurance for safe ADLs and gait.   Follow Up Recommendations  SNF     Equipment Recommendations       Recommendations for Other Services       Precautions / Restrictions Precautions Precautions: Fall Restrictions Weight Bearing Restrictions: No    Mobility  Bed Mobility Overal bed mobility: Needs Assistance Bed Mobility: Supine to Sit     Supine to sit: Mod assist;Max assist Sit to supine: Mod assist;Max assist   General bed mobility comments: increased time, labored movement, very stiff in trunk and LE's    Transfers Overall transfer level: Needs assistance Equipment used: Rolling walker (2 wheeled) Transfers: Sit to/from Stand Sit to Stand: Mod assist;Max assist         General transfer comment: extension tone, unable to fully transfer to standing due to extensor tone  Ambulation/Gait                 Stairs  Wheelchair Mobility    Modified Rankin (Stroke Patients Only)       Balance                                            Cognition Arousal/Alertness: Lethargic Behavior During Therapy: Flat affect Overall Cognitive Status: Impaired/Different from baseline                                 General Comments: Pt eyes were closed often during treamtent. Noted extension posture when attempting to sit up at EOB and during the transfer.      Exercises General Exercises - Lower Extremity Long Arc Quad: Seated;AROM;Strengthening;Both;10 reps Hip Flexion/Marching:  Seated;AROM;Strengthening;Both;10 reps Toe Raises: Seated;AROM;Strengthening;Both;10 reps Heel Raises: Seated;AROM;Strengthening;Both;10 reps    General Comments        Pertinent Vitals/Pain Pain Assessment: No/denies pain    Home Living                      Prior Function            PT Goals (current goals can now be found in the care plan section) Acute Rehab PT Goals Patient Stated Goal: return home after rehab PT Goal Formulation: With patient Time For Goal Achievement: 06/01/20 Potential to Achieve Goals: Good    Frequency    Min 3X/week      PT Plan Current plan remains appropriate    Co-evaluation              AM-PAC PT "6 Clicks" Mobility   Outcome Measure  Help needed turning from your back to your side while in a flat bed without using bedrails?: A Lot Help needed moving from lying on your back to sitting on the side of a flat bed without using bedrails?: A Lot Help needed moving to and from a bed to a chair (including a wheelchair)?: A Lot Help needed standing up from a chair using your arms (e.g., wheelchair or bedside chair)?: A Lot Help needed to walk in hospital room?: A Lot Help needed climbing 3-5 steps with a railing? : Total 6 Click Score: 11    End of Session Equipment Utilized During Treatment: Gait belt Activity Tolerance: Treatment limited secondary to medical complications (Comment);Other (comment);Patient limited by lethargy Patient left: with call bell/phone within reach;in bed;with bed alarm set;with nursing/sitter in room Nurse Communication: Mobility status PT Visit Diagnosis: Unsteadiness on feet (R26.81);Other abnormalities of gait and mobility (R26.89);Muscle weakness (generalized) (M62.81)     Time: 2353-6144 PT Time Calculation (min) (ACUTE ONLY): 16 min  Charges:  $Therapeutic Exercise: 8-22 mins                     4:06 PM, 05/25/20 Mearl Latin PT, DPT Physical Therapist at [facility]

## 2020-05-25 NOTE — Plan of Care (Signed)

## 2020-05-25 NOTE — Progress Notes (Signed)
[facility] night shift.  The staff reported that the patient's lactic acid level this morning was 2.6 mmol/L.  A 1000 mL lactated ringer bolus over 2 hours was ordered.  Tennis Must, MD.

## 2020-05-25 NOTE — Evaluation (Signed)
  Modified Barium Swallow Progress Note  Patient Details  Name: [name] MRN: [id number] Date of Birth: 1965/05/31  Today's Date: 05/25/2020  Modified Barium Swallow completed.  Full report located under Chart Review in the Imaging Section.  Brief recommendations include the following:  Clinical Impression  Pt presents with moderate oropharyngeal dysphagia characterized by silent aspiration of thin liquids; amount of aspiration is dependent on presentation type and bolus size. Pt demonstrates premature spillage, decreased epiglottic deflection, decreased laryngeal vestibule closure and decreased pharyngeal squeeze. Tsp sips of thin liquids result in trace flash penetration, cup sips result in penetration with trace amounts falling to the cords that are not always visualized being cleared from the laryngeal vestibule; straw sips of thin result in moderate amounts falling to the cords and eventually being aspirated. Pt also demonstrated a moderate amount of aspiration of a cup sip of thin with the barium tablet administration (this unfortunately occurred prior to floro being turned on, however evidence of aspiration present on the tracheal wall); All aspiration was silent and required verbal cues for Pt to cough. Further, cough was inconsitently effective in clearing aspirates. With NTL Pt demonstrated occasional flash penetration and puree and regular textures were consumed without incident. Recommend continue with a D2/fine chop diet and NTL. Pt is cognitively unable to consistently utilize strategies. SLP will continue to follow acutely   Swallow Evaluation Recommendations       SLP Diet Recommendations: Dysphagia 2 (Fine chop) solids;Nectar thick liquid   Liquid Administration via: Cup;Straw   Medication Administration: Whole meds with puree   Supervision: Full supervision/cueing for compensatory strategies;Full assist for feeding   Compensations: Minimize environmental  distractions;Slow rate;Small sips/bites   Postural Changes: Seated upright at 90 degrees   Oral Care Recommendations: Oral care BID   Other Recommendations: Order thickener from [place]. Roddie Mc, CCC-SLP Speech Language Pathologist  Wende Bushy 05/25/2020,4:01 PM

## 2020-05-25 NOTE — Progress Notes (Signed)
PT Cancellation Note  Patient Details Name: [name] MRN: [id number] DOB: [birth year]   Cancelled Treatment:    Reason Eval/Treat Not Completed: Patient at procedure or test/unavailable; Attempted PT treatment but patient away at swallow study. Will check back if time permits.   3:06 PM, 05/25/20 Mearl Latin PT, DPT Physical Therapist at [facility]

## 2020-05-26 DIAGNOSIS — L8990 Pressure ulcer of unspecified site, unspecified stage: Secondary | ICD-10-CM | POA: Insufficient documentation

## 2020-05-26 LAB — CBC
HCT: 24.4 % — ABNORMAL LOW (ref 36.0–46.0)
Hemoglobin: 8.1 g/dL — ABNORMAL LOW (ref 12.0–15.0)
MCH: 30.2 pg (ref 26.0–34.0)
MCHC: 33.2 g/dL (ref 30.0–36.0)
MCV: 91 fL (ref 80.0–100.0)
Platelets: 228 10*3/uL (ref 150–400)
RBC: 2.68 MIL/uL — ABNORMAL LOW (ref 3.87–5.11)
RDW: 18.5 % — ABNORMAL HIGH (ref 11.5–15.5)
WBC: 12.3 10*3/uL — ABNORMAL HIGH (ref 4.0–10.5)
nRBC: 0.4 % — ABNORMAL HIGH (ref 0.0–0.2)

## 2020-05-26 LAB — TYPE AND SCREEN
ABO/RH(D): A POS
Antibody Screen: NEGATIVE
Unit division: 0

## 2020-05-26 LAB — BPAM RBC
Blood Product Expiration Date: 202206022359
ISSUE DATE / TIME: 202205131034
Unit Type and Rh: 6200

## 2020-05-26 LAB — GLUCOSE, CAPILLARY: Glucose-Capillary: 118 mg/dL — ABNORMAL HIGH (ref 70–99)

## 2020-05-26 NOTE — Progress Notes (Signed)
PROGRESS NOTE  [name] RCV:[id number] DOB: [birth year] DOA: 05/12/2020 PCP: [name], MD   Brief History:  55 year old female with a history of COPD, meningioma, cardiac arrest December 2020, hepatic steatosis,PNES, cognitive impairment, depression, complex partial seizure presentingwithgeneralized weakness and a fall.This has been a recurrent problem for the patient. She was admitted on 2/7 to 02/24/20 with a similar presentation. At that time she was treated for acute metabolic encephalopathy which was thought to be multifactorial including elevated ammonia, dehydration and dopamine agonist. She was discharged home with HHPT. She follow up with [facility], Dr. 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 She underwent liver elastography on 05/03/20 which suggested cACLD with increased probability of clinically significant portal HTN. She presented on 05/12/20 with another mechanical fall.She reports that her house is being remodeled and she tripped on something and fell forward hit her head on the railing and landed on her right chest. She reports that she hit the back of her head on the way down. She had no loss of consciousness. At presentation her temperature was 101.Blood cultures were done and she was found to have MSSA bacteremia. Also CT of the abd/pelvis on 05/13/20 showed signs of cirrhosis with portal HTN. She also had splenomegaly. THere was also a mildly thickened cecum and ascending colon with diffuse edematous appearance of the sm bowel. She underwent TEE on 05/16/20 after which she remained hypoxic on NRB.  TEE was negative for vegetation. She was then transferred to SDU for further care.She was started on IV lasix with clinical improvement.  Assessment/Plan: Severe Sepsis -present on admission -presented with fever, tachycardia, tachypnea, elevated lactate -due to MSSA bacteremia -Lactic peaked  2.2>>1.8>>2.6 (maybe in the setting of dehydration); IVF's added. -UA 6-10 WBC -4/30CXR--no consolidation -Random cortisol level 17.1 -Continue supportive care and follow clinical response -WBCs down to 13K; patient now afebrile for 24 hours.  Mild hypernatremia and elevated lactic acid -improved with addition of IVF's -follow clinical response and ability to maintain oral intake.  MSSA Bacteremia -05/12/20 blood culture--positive MSSA -05/14/20 blood cuture--neg to date -continue cefazolin -05/16/20 TEE--no vegetation -PICC line 5/6 if surveillance blood cultures remain neg -plan is to continue abx till 5/29  Hepatic Encephalopathy/Acute metabolic Encephalopathy -Over last 48 hours, pt more confused, slower to answer questions -multifactorial including opioids, hypnotic meds, elevated ammonia -UA--neg for pyuria -05/22/20 ammonia--64>>60>>49 -Continue lactulose at current dose.  Acute respiratory failure with hypoxia -oxygen saturation 88-90% on NRB in PACU 5/4 -personally reviewed CXR--bilateral interstitial infiltrates -5/4/22ABG 7.471/32/57/24 (0.8) -continuelasix 40 mg IV--increase to bid -05/15/20 Echo=EF 60-65%, no WMA, indeterminant diastolic -now down to 1.0C [place]>>RA -plan to discharge on lasix 40 mg po daily -Continue to follow daily weights and strict I's and O's.  Chronic pain -d/c dilaudid due to drowsiness -5/8 at 0120--started on fentanyl due to n/v -5/8 afternoon--remains drowsy -5/8 AM RN reports patient tolerating diet without n/v -d/c fentanyl -restartedoxycodone>>cut to 1/2 dose; follow clinical response.  NAFLD/Liver Cirrhosis -05/13/20 CT abd as discussed above -05/14/20 paracentesis--WBC 51, only 20 cc removed -remains mildly hypervolemic; will continue lasix -Continue lactulose at current dose. -Mentation continues slowly but steadily to improved -Will continue to follow intermittently ammonia level -ammonia level 49  Colonic Wall  Thickening -?portal HTN colopathy -Tolerating diet -Mild vague discomfort reported after lactulose initiated -good response to bowel regimen; positive BM's reported -will continue following.   Gait instability/frequent  falls/dizziness -PT evaluation-->SNF has been recommended. -CT brain negativeacute findings -No focal deficits on exam -5/3/22echo--EF 60-65%, no WMA -No abnormalities appreciated on telemetry.  Complex partial seizure/PNES -Continue home dose of Keppra and Lamictal -No active seizure appreciated currently. -seizure precaution in place.   Depression -Continue fluoxetine 80 mg daily -Continue as needed lorazepam  Meningioma -Patient follows Dr. [name] -s/p fractionated radiosurgery Sept 2020--Dr. [name] -01/18/20 MR brain--mass is stable in size as compared to the brain MRI of 09/13/2019 -no focal deficits on exam  Hypokalemia -Continue as needed repletion -Will continue to follow electrolytes intermittently.  Transaminasemia -gradual rise due to hemodynamic changes (soft BP) in the setting of NAFLD and hepatic congestion (fluid overload) -05/14/20 RUQ US--Nodular contour to the liver suggesting cirrhosis. Mild ascites. Mild sludge in otherwise normal appearing gallbladder. A small amount of fluid adjacent to the gallbladder is likely due to the ascites. -05/13/20 viral hepatitis serology--neg -Continue to follow LFTs trend intermittently   Restless Leg -previously discontinued mirapex due to dopamine agonist causing confusion -Overall confusion improved -No complaining of urinary cramps. -Continue holding Mirapex.   Status is: Inpatient  Remains inpatient appropriate because:IV treatments appropriate due to intensity of illness or inability to take PO   Dispo: The patient is from: Home  Anticipated d/c is to: SNF  Patient currently reaching medical stability to be discharged to skilled nursing facility; hopefully in  the next 24 hours.              Difficult to place patient No    Family Communication:    No family at bedside today..  Consultants:  none  Code Status:  FULL  DVT Prophylaxis:  SCD's   Procedures: As Listed in Progress Note Above  Antibiotics: Cefazolin  5/2>> antibiotics completion anticipated on 06/10/20    Subjective: Afebrile for 48 hours; no chest pain, no nausea, no vomiting.  Feeling better and more interactive.  Patient has started to do more with oral intake on her own.  Objective: Vitals:   05/25/20 2027 05/26/20 0455 05/26/20 0456 05/26/20 1315  BP: (!) 138/55 126/62  106/73  Pulse: 84 75  74  Resp: (!) 22 18  20   Temp: 97.7 F (36.5 C) 98.3 F (36.8 C)  98.8 F (37.1 C)  TempSrc: Oral Oral    SpO2: 97% 97%  99%  Weight:   61.6 kg   Height:        Intake/Output Summary (Last 24 hours) at 05/26/2020 1826 Last data filed at 05/26/2020 1300 Gross per 24 hour  Intake 460 ml  Output 300 ml  Net 160 ml   Weight change: 0.6 kg  Exam: General exam: Alert, awake, oriented x 2; afebrile for 48 hours currently; no chest pain, no nausea, no vomiting and is able to follow commands appropriately.  Reports significant improvement in her abdomen discomfort.  Positive bowel movement overnight. Respiratory system: Good air movement bilaterally, no wheezing, no crackles, no using accessory muscles.   Cardiovascular system:RRR. No murmurs, rubs, gallops. Gastrointestinal system: Abdomen is mildly distended, soft and nontender. No organomegaly or masses felt. Normal bowel sounds heard. Central nervous system: Alert and oriented. No focal neurological deficits. Extremities: No cyanosis or clubbing.  Bruising/hematoma on her left posterior lateral thigh appreciated; appears to be stable. Skin: No rashes, no petechiae.  Present on admission stage II decubitus ulcer. Psychiatry: Mood & affect appropriate.    Data Reviewed: I have personally reviewed following  labs and imaging studies  Basic Metabolic Panel: Recent  Labs  Lab 05/20/20 0459 05/22/20 0444 05/23/20 0436 05/24/20 0108 05/25/20 0454  NA 137 144 144 147* 142  K 3.5 5.1 3.2* 4.0 3.2*  CL 101 109 111 115* 109  CO2 26 23 24 22 23   GLUCOSE 112* 89 123* 127* 140*  BUN 12 17 19  21* 23*  CREATININE 0.48 0.80 0.75 0.82 0.73  CALCIUM 7.7* 8.0* 7.8* 7.8* 7.5*  MG 2.1 1.7  --  2.6*  --    Liver Function Tests: Recent Labs  Lab 05/20/20 0459 05/22/20 0444 05/23/20 0436  AST 111* 248* 310*  ALT 36 59* 68*  ALKPHOS 143* 167* 141*  BILITOT 1.5* 2.4* 2.3*  PROT 5.4* 5.5* 4.9*  ALBUMIN 2.3* 2.5* 2.3*    Recent Labs  Lab 05/22/20 1016 05/22/20 1257 05/24/20 0108  AMMONIA 64* 60* 49*   Coagulation Profile: Recent Labs  Lab 05/22/20 0841 05/23/20 0436  INR 2.1* 2.5*   CBC: Recent Labs  Lab 05/20/20 0459 05/23/20 0436 05/24/20 0108 05/25/20 0454 05/26/20 0405  WBC 8.6 16.3* 18.6* 13.9* 12.3*  HGB 9.5* 7.8* 7.3* 6.8* 8.1*  HCT 28.3* 22.8* 21.8* 20.6* 24.4*  MCV 88.7 87.4 90.5 90.7 91.0  PLT 235 238 249 262 228   Urine analysis:    Component Value Date/Time   COLORURINE YELLOW 05/21/2020 1355   APPEARANCEUR CLEAR 05/21/2020 1355   APPEARANCEUR Clear 01/30/2017 1020   LABSPEC 1.017 05/21/2020 1355   PHURINE 6.0 05/21/2020 1355   GLUCOSEU NEGATIVE 05/21/2020 1355   HGBUR MODERATE (A) 05/21/2020 1355   BILIRUBINUR NEGATIVE 05/21/2020 1355   BILIRUBINUR Negative 01/30/2017 [place] 05/21/2020 1355   PROTEINUR NEGATIVE 05/21/2020 1355   UROBILINOGEN negative 04/19/2014 1332   UROBILINOGEN 0.2 12/04/2013 1414   NITRITE NEGATIVE 05/21/2020 1355   LEUKOCYTESUR NEGATIVE 05/21/2020 1355   Sepsis Labs:  Recent Results (from the past 240 hour(s))  Culture, Urine     Status: None   Collection Time: 05/21/20  1:55 PM   Specimen: Urine, Random  Result Value Ref Range Status   Specimen Description   Final    URINE, RANDOM Performed at [facility], [address], [place], [place] [address]    Special Requests   Final    NONE Performed at [facility], [address], [place], [place] [address]    Culture   Final    NO GROWTH Performed at [facility], [place] [address] [place], [place] [address]    Report Status 05/23/2020 FINAL  Final  Culture, blood (Routine X 2) w Reflex to ID Panel     Status: None (Preliminary result)   Collection Time: 05/22/20  4:33 PM   Specimen: BLOOD RIGHT ARM  Result Value Ref Range Status   Specimen Description BLOOD RIGHT ARM  Final   Special Requests   Final    BOTTLES DRAWN AEROBIC AND ANAEROBIC Blood Culture results may not be optimal due to an excessive volume of blood received in culture bottles   Culture   Final    NO GROWTH 4 DAYS Performed at [facility], [address] [place], [place] [address]    Report Status PENDING  Incomplete  Culture, blood (Routine X 2) w Reflex to ID Panel     Status: None (Preliminary result)   Collection Time: 05/22/20  4:33 PM   Specimen: BLOOD LEFT ARM  Result Value Ref Range Status   Specimen Description BLOOD LEFT ARM  Final   Special Requests  Final    BOTTLES DRAWN AEROBIC AND ANAEROBIC Blood Culture adequate volume   Culture   Final    NO GROWTH 4 DAYS Performed at [facility], [address], [place], [place] [address]    Report Status PENDING  Incomplete     Scheduled Meds: . Chlorhexidine Gluconate Cloth  6 each Topical Daily  . feeding supplement  237 mL Oral BID BM  . folic acid  1 mg Oral Daily  . furosemide  40 mg Oral Daily  . haloperidol lactate  3 mg Intravenous Once  . lactobacillus  1 g Oral TID WC  . lactulose  20 g Oral TID  . lamoTRIgine  150 mg Oral BID  . levETIRAcetam  1,000 mg Oral BID  . metoprolol tartrate  12.5 mg Oral BID  . senna-docusate  1 tablet Oral BID  . sodium chloride flush  10-40 mL Intracatheter Q12H   Continuous Infusions: .  ceFAZolin (ANCEF) IV 2 g (05/26/20 1802)     Procedures/Studies: CT HEAD WO CONTRAST  Result Date: 05/24/2020 CLINICAL DATA:  55 year old female with altered mental status. EXAM: CT HEAD WITHOUT CONTRAST TECHNIQUE: Contiguous axial images were obtained from the base of the skull through the vertex without intravenous contrast. COMPARISON:  Head CT dated 05/12/2020. FINDINGS: Evaluation of this exam is limited due to motion artifact. Brain: The ventricles and sulci appropriate size for patient's age. The gray-white matter discrimination is preserved. There is no acute intracranial hemorrhage. No mass effect or midline shift. No extra-axial fluid collection. Vascular: No hyperdense vessel or unexpected calcification. Skull: Normal. Negative for fracture or focal lesion. Sinuses/Orbits: No acute finding. Other: Mild left forehead contusion. IMPRESSION: No acute intracranial pathology. Electronically Signed   By: Anner Crete M.D.   On: 05/24/2020 02:17   CT HEAD WO CONTRAST  Result Date: 05/13/2020 CLINICAL DATA:  Fall 2 days prior EXAM: CT HEAD WITHOUT CONTRAST TECHNIQUE: Contiguous axial images were obtained from the base of the skull through the vertex without intravenous contrast. COMPARISON:  CT 02/20/2020 FINDINGS: Brain: No evidence of acute infarction, hemorrhage, hydrocephalus, extra-axial collection, visible mass lesion or mass effect. Vascular: Atherosclerotic calcification of the carotid siphons. No hyperdense vessel. Skull: No calvarial fracture or suspicious osseous lesion. No scalp swelling or hematoma. Sinuses/Orbits: Paranasal sinuses are predominantly clear. Mastoid air cells appear chronically hypo pneumatized. Middle ear cavities are clear. Debris in the bilateral external auditory canals. Included orbital structures are unremarkable. Other: Asymmetric thickening and calcification in the left parotid gland is a chronic, nonspecific finding, similar to comparison study. IMPRESSION: No acute intracranial abnormality. No calvarial  fracture, significant scalp swelling or hematoma. Intracranial atherosclerosis. Asymmetric thickening and calcification in the left parotid gland, a nonspecific, chronic finding unchanged from prior. Can reflect sequela of a chronic inflammatory process. Debris in the external auditory canals, correlate for cerumen impaction. Electronically Signed   By: Lovena Le M.D.   On: 05/13/2020 00:44   CT ABDOMEN PELVIS W CONTRAST  Result Date: 05/13/2020 CLINICAL DATA:  Abdominal pain, fever, right upper quadrant pain EXAM: CT ABDOMEN AND PELVIS WITH CONTRAST TECHNIQUE: Multidetector CT imaging of the abdomen and pelvis was performed using the standard protocol following bolus administration of intravenous contrast. CONTRAST:  143mL OMNIPAQUE IOHEXOL 300 MG/ML  SOLN COMPARISON:  CT 02/12/2020 FINDINGS: Lower chest: Basilar atelectatic changes in the lungs including more bandlike areas of subsegmental atelectasis or scarring. Normal heart size. No pericardial effusion. Hepatobiliary: Heterogeneous hepatic attenuation with a nodular liver surface contour concerning  for intrinsic liver disease/cirrhosis. No concerning focal liver lesion. Moderate gallbladder distension. No significant gallbladder wall thickening or pericholecystic inflammation is seen. No visible calcified gallstones or biliary ductal dilatation. Pancreas: Slightly edematous appearance of the pancreatic parenchyma with very faint peripancreatic haze, uniform enhancement. No pancreatic ductal dilatation. Spleen: Mild splenomegaly.  No concerning focal splenic lesion. Adrenals/Urinary Tract: Normal adrenals. Kidneys enhance symmetrically. Symmetric and uniform excretion. No concerning focal renal lesion. Bilateral renal cortical scarring. Nonobstructing calculi present in both kidneys. No obstructive urolithiasis or hydronephrosis is seen at this time. Bladder is unremarkable for the degree of distention. Stomach/Bowel: Paraesophageal venous collaterals and  varices. Some minimal collateralization and varices about the gastric fundus as well. Thickening towards the gastric antrum likely reflecting normal peristalsis. Air and fluid-filled appearance of the duodenum with a normal sweep across the midline abdomen. Slightly edematous appearance diffusely through the small bowel is nonspecific given additional findings above. Moderate right-sided colonic stool burden, particularly within the cecum. Some mild circumferential thickening and mucosal hyperemia about the cecum and ascending colon is nonspecific given hepatic features. No evidence of mechanical obstruction. Appendix is not well visualized. Vascular/Lymphatic: Atherosclerotic calcifications within the abdominal aorta and branch vessels. No aneurysm or ectasia. Paraesophageal and gastric varices with venous collaterals, as above. Splenorenal collateralization. Upper abdominal varices.No enlarged abdominopelvic lymph nodes. Few scattered calcified lymph nodes are present in the abdomen and pelvis. Reproductive: Portion the pelvis obscured by streak artifact. 2.6 cm cystic focus in the left adnexa, unchanged from comparison. No concerning right adnexal lesion. Prior hysterectomy. Other: Increasing edematous changes of the central mesentery. Small volume low-attenuation fluid in the deep pelvis as well as trace perihepatic ascites. Circumferential body wall edema. No bowel containing hernia. No free abdominopelvic air. Musculoskeletal: Multilevel degenerative changes are present in the imaged portions of the spine. Grade 1 anterolisthesis L4 on L5 without spondylolysis, unchanged from prior. Prior left hip arthroplasty with chronic bony remodeling and a similar appearance overall to comparison prior. IMPRESSION: Constellation of features suggestive of intrinsic liver disease/cirrhosis with features of portal hypertension including a nodular, heterogeneous liver with paraesophageal and gastric varices, additional upper  abdominal venous collateralization, splenorenal shunting and splenomegaly. Interval development of a small volume ascites as well as additional features of at least mild or early developing anasarca with circumferential body wall edema and central mesenteric edema. Some hazy peripancreatic stranding may be present, possibly related to a diffusely edematous appearance of the mesentery though could correlate with lipase. Mildly thickened appearance of the cecum and ascending colon as well as a diffusely edematous appearance of the small bowel is nonspecific in the setting of suspected intrinsic liver disease. Could reflect portal enteropathy/colopathy though should exclude clinical symptoms of enterocolitis. Bilateral nonobstructing nephrolithiasis. No obstructive urolithiasis or hydronephrosis at this time. Bilateral renal cortical scarring. Prior hysterectomy. Stable 2.7 cm left ovarian cyst. Continued stability favoring a benign process. No follow-up imaging recommended. Note: This recommendation does not apply to premenarchal patients and to those with increased risk (genetic, family history, elevated tumor markers or other high-risk factors) of ovarian cancer. Reference: JACR 2020 Feb; 17(2):248-254 Aortic Atherosclerosis (ICD10-I70.0). Electronically Signed   By: Lovena Le M.D.   On: 05/13/2020 00:35   US Abdomen Limited  Result Date: 05/13/2020 CLINICAL DATA:  Right upper quadrant pain. EXAM: ULTRASOUND ABDOMEN LIMITED RIGHT UPPER QUADRANT COMPARISON:  February 23, 2020 ultrasound.  CT scan May 12, 2020. FINDINGS: Gallbladder: A small amount of sludge is seen in the gallbladder. No stones, Murphy's sign, or  wall thickening. A small amount of ascites is identified, including adjacent to the gallbladder. Common bile duct: Diameter: 5 mm Liver: There is a nodular contour in the liver. No focal mass. Portal vein is patent on color Doppler imaging with normal direction of blood flow towards the liver. Other:  Mild ascites. IMPRESSION: 1. Nodular contour to the liver suggesting cirrhosis.  Mild ascites. 2. Mild sludge in otherwise normal appearing gallbladder. A small amount of fluid adjacent to the gallbladder is likely due to the ascites. Electronically Signed   By: Dorise Bullion III M.D   On: 05/13/2020 09:06   US Paracentesis  Result Date: 05/14/2020 INDICATION: Fever, abdominal pain, question spontaneous bacterial peritonitis, ascites on CT EXAM: ULTRASOUND GUIDED DIAGNOSTIC PARACENTESIS MEDICATIONS: None COMPLICATIONS: None immediate PROCEDURE: Informed written consent was obtained from the patient after a discussion of the risks, benefits and alternatives to treatment. A timeout was performed prior to the initiation of the procedure. Initial ultrasound scanning demonstrates a small amount of ascites within the right lower abdominal quadrant. The right lower abdomen was prepped and draped in the usual sterile fashion. 1% lidocaine was used for local anesthesia. Following this, a 5 [place] Yueh catheter was introduced. An ultrasound image was saved for documentation purposes. The paracentesis was performed. The catheter was removed and a dressing was applied. The patient tolerated the procedure well without immediate post procedural complication. FINDINGS: A total of approximately 20 mL of clear yellow ascitic fluid was removed. Samples were sent to the laboratory as requested by the clinical team. IMPRESSION: Successful ultrasound-guided paracentesis yielding 20 mL of peritoneal fluid for diagnostic testing. Electronically Signed   By: Lavonia Dana M.D.   On: 05/14/2020 12:39   DG CHEST PORT 1 VIEW  Result Date: 05/24/2020 CLINICAL DATA:  55 year old female with tachycardia. EXAM: PORTABLE CHEST 1 VIEW COMPARISON:  Chest radiograph dated 05/22/2020. FINDINGS: Right-sided PICC with tip at the cavoatrial junction. No interval change in bilateral interstitial reticulonodular densities. No pleural effusion or  pneumothorax. Stable cardiomediastinal silhouette. No acute osseous pathology. IMPRESSION: No interval change in the bilateral interstitial reticulonodular densities. Electronically Signed   By: Anner Crete M.D.   On: 05/24/2020 01:33   DG CHEST PORT 1 VIEW  Result Date: 05/22/2020 CLINICAL DATA:  Cough EXAM: PORTABLE CHEST 1 VIEW COMPARISON:  Chest radiograph dated 05/20/2020. FINDINGS: The heart size and mediastinal contours are within normal limits. Moderate bilateral interstitial and airspace opacities appear unchanged since prior exam. A right upper extremity peripherally inserted central venous catheter tip overlies the superior cavoatrial junction. IMPRESSION: Moderate bilateral interstitial and airspace opacities are not significantly changed and likely represent pulmonary edema. Electronically Signed   By: Zerita Boers M.D.   On: 05/22/2020 16:46   DG CHEST PORT 1 VIEW  Result Date: 05/20/2020 CLINICAL DATA:  Pulmonary edema EXAM: PORTABLE CHEST 1 VIEW COMPARISON:  05/16/2020 chest radiograph. FINDINGS: Right PICC terminates over the right atrium. Stable cardiomediastinal silhouette with normal heart size. No pneumothorax. No pleural effusion. Patchy hazy opacities throughout both lungs, significantly improved. IMPRESSION: Significantly improved patchy hazy opacities throughout both lungs, compatible with improving pulmonary edema. Electronically Signed   By: Ilona Sorrel M.D.   On: 05/20/2020 07:32   DG CHEST PORT 1 VIEW  Result Date: 05/16/2020 CLINICAL DATA:  Shortness of breath. EXAM: PORTABLE CHEST 1 VIEW COMPARISON:  Chest x-ray dated May 12, 2020. FINDINGS: The heart size and mediastinal contours are within normal limits. Prominent diffuse interstitial thickening with bilateral perihilar opacities. Small  bilateral pleural effusions. No pneumothorax. No acute osseous abnormality. IMPRESSION: 1. Moderate to severe pulmonary edema with small bilateral pleural effusions. Electronically  Signed   By: Titus Dubin M.D.   On: 05/16/2020 12:57   DG Chest Portable 1 View  Result Date: 05/12/2020 CLINICAL DATA:  Right sided rib pain. EXAM: PORTABLE CHEST 1 VIEW COMPARISON:  February 12, 2020 FINDINGS: A very mild amount of linear atelectasis is seen within the right lung base. There is no evidence of acute infiltrate, pleural effusion or pneumothorax. The heart size and mediastinal contours are within normal limits. A chronic seventh right rib fracture is seen. IMPRESSION: 1. Very mild right basilar linear atelectasis. 2. Chronic seventh right rib fracture. Electronically Signed   By: Virgina Norfolk M.D.   On: 05/12/2020 23:16   DG Swallowing Func-Speech Pathology  Result Date: 05/25/2020 Objective Swallowing Evaluation: Type of Study: MBS-Modified Barium Swallow Study  Patient Details Name: [name] MRN: [id number] Date of Birth: 02-23-1965 Today's Date: 05/25/2020 Time: SLP Start Time (ACUTE ONLY): 1436 -SLP Stop Time (ACUTE ONLY): 1505 SLP Time Calculation (min) (ACUTE ONLY): 29 min Past Medical History: Past Medical History: Diagnosis Date . Arthritis   "knees" (04/22/2012) . Brain tumor (benign) ([place])  . Chronic bronchitis ([place])   "yearly; when the weather changes" (04/22/2012) . Colon polyp  . Colon polyps   adenomatous and hyperplastic- . Depression  . Eczema  . Epilepsy ([place])   "been having them right often here lately" (04/22/2012) . Fatty liver  . High cholesterol  . History of kidney stones  . Osteoarthritis   [name] 04/22/2012 . Other convulsions 05/21/12  non-epileptic spells . Restless leg  . Seizures ([place])  . Vertigo  Past Surgical History: Past Surgical History: Procedure Laterality Date . ABDOMINAL HYSTERECTOMY  2001 . BLADDER SUSPENSION   . BUNIONECTOMY Left 2000 . CESAREAN SECTION  1987; 1988 . COLONOSCOPY   . JOINT REPLACEMENT   . MASS EXCISION  10/22/2011  Procedure: EXCISION MASS;  Surgeon: [name], MD;  Location: [facility];  Service: General;  Laterality: Right;   excision right buttock mass . TEE WITHOUT CARDIOVERSION N/A 05/16/2020  Procedure: TRANSESOPHAGEAL ECHOCARDIOGRAM (TEE) WITH PROPOFOL;  Surgeon: Arnoldo Lenis, MD;  Location: [facility];  Service: Endoscopy;  Laterality: N/A; . TOTAL HIP ARTHROPLASTY Left 1993; 1995; 2000 . TOTAL KNEE ARTHROPLASTY Left 05/07/2015  Procedure: TOTAL LEFT KNEE ARTHROPLASTY;  Surgeon: [name], MD;  Location: [facility];  Service: Orthopedics;  Laterality: Left; . TOTAL KNEE ARTHROPLASTY Right 10/01/2015  Procedure: RIGHT TOTAL KNEE ARTHROPLASTY;  Surgeon: [name], MD;  Location: [facility];  Service: Orthopedics;  Laterality: Right; HPI: 55 year old female with a history of COPD, meningioma, cardiac arrest December 2020, hepatic steatosis, PNES, cognitive impairment, depression, complex partial seizure presenting with generalized weakness and a fall.  This has been a recurrent problem for the patient.  She was admitted on 2/7 to 02/24/20 with a similar presentation.  At that time she was treated for acute metabolic encephalopathy which was thought to be multifactorial including elevated ammonia, dehydration and dopamine agonist.  BSE requested  No data recorded Assessment / Plan / Recommendation CHL IP CLINICAL IMPRESSIONS 05/25/2020 Clinical Impression Pt presents with moderate oropharyngeal dysphagia characterized by silent aspiration of thin liquids; amount of aspiration is dependent on presentation type and bolus size. Pt demonstrates premature spillage, decreased epiglottic deflection, decreased laryngeal vestibule closure and decreased pharyngeal squeeze. Tsp sips of thin liquids result in trace flash penetration, cup sips result  in penetration with trace amounts falling to the cords that are not always visualized being cleared from the laryngeal vestibule; straw sips of thin result in moderate amounts falling to the cords and eventually being aspirated. Pt also demonstrated a moderate amount of aspiration of a cup sip of thin with  the barium tablet administration (this unfortunately occurred prior to floro being turned on, however evidence of aspiration present on the tracheal wall); All aspiration was silent and required verbal cues for Pt to cough. Further, cough was inconsitently effective in clearing aspirates. With NTL Pt demonstrated occasional flash penetration and puree and regular textures were consumed without incident. Recommend continue with a D2/fine chop diet and NTL. Pt is cognitively unable to consistently utilize strategies. SLP will continue to follow acutely SLP Visit Diagnosis Dysphagia, unspecified (R13.10) Attention and concentration deficit following -- Frontal lobe and executive function deficit following -- Impact on safety and function Mild aspiration risk;Moderate aspiration risk   CHL IP TREATMENT RECOMMENDATION 05/25/2020 Treatment Recommendations F/U MBS in --- days (Comment);Therapy as outlined in treatment plan below   Prognosis 05/25/2020 Prognosis for Safe Diet Advancement Good Barriers to Reach Goals Cognitive deficits Barriers/Prognosis Comment -- CHL IP DIET RECOMMENDATION 05/25/2020 SLP Diet Recommendations Dysphagia 2 (Fine chop) solids;Nectar thick liquid Liquid Administration via Cup;Straw Medication Administration Whole meds with puree Compensations Minimize environmental distractions;Slow rate;Small sips/bites Postural Changes Seated upright at 90 degrees   CHL IP OTHER RECOMMENDATIONS 05/25/2020 Recommended Consults -- Oral Care Recommendations Oral care BID Other Recommendations Order thickener from pharmacy   CHL IP FOLLOW UP RECOMMENDATIONS 05/25/2020 Follow up Recommendations 24 hour supervision/assistance   CHL IP FREQUENCY AND DURATION 05/25/2020 Speech Therapy Frequency (ACUTE ONLY) min 1 x/week Treatment Duration 1 week      CHL IP ORAL PHASE 05/25/2020 Oral Phase Impaired Oral - Pudding Teaspoon -- Oral - Pudding Cup -- Oral - Honey Teaspoon -- Oral - Honey Cup -- Oral - Nectar Teaspoon Premature  spillage;Decreased bolus cohesion;Piecemeal swallowing Oral - Nectar Cup Premature spillage;Decreased bolus cohesion;Piecemeal swallowing Oral - Nectar Straw NT Oral - Thin Teaspoon Decreased bolus cohesion;Premature spillage;Piecemeal swallowing Oral - Thin Cup Premature spillage;Decreased bolus cohesion;Piecemeal swallowing Oral - Thin Straw Premature spillage;Decreased bolus cohesion;Piecemeal swallowing Oral - Puree WFL Oral - Mech Soft -- Oral - Regular Piecemeal swallowing Oral - Multi-Consistency -- Oral - Pill (No Data) Oral Phase - Comment --  CHL IP PHARYNGEAL PHASE 05/25/2020 Pharyngeal Phase Impaired Pharyngeal- Pudding Teaspoon -- Pharyngeal -- Pharyngeal- Pudding Cup -- Pharyngeal -- Pharyngeal- Honey Teaspoon -- Pharyngeal -- Pharyngeal- Honey Cup -- Pharyngeal -- Pharyngeal- Nectar Teaspoon NT Pharyngeal -- Pharyngeal- Nectar Cup Pharyngeal residue - valleculae;Pharyngeal residue - pyriform;Penetration/Aspiration during swallow Pharyngeal Material enters airway, remains ABOVE vocal cords then ejected out Pharyngeal- Nectar Straw NT Pharyngeal -- Pharyngeal- Thin Teaspoon Pharyngeal residue - valleculae;Pharyngeal residue - pyriform;Penetration/Aspiration during swallow;Penetration/Aspiration before swallow;Trace aspiration;Reduced epiglottic inversion;Delayed swallow initiation-pyriform sinuses;Reduced pharyngeal peristalsis;Reduced airway/laryngeal closure Pharyngeal Material enters airway, passes BELOW cords without attempt by patient to eject out (silent aspiration) Pharyngeal- Thin Cup Pharyngeal residue - valleculae;Pharyngeal residue - pyriform;Penetration/Aspiration during swallow;Penetration/Aspiration before swallow;Trace aspiration;Reduced epiglottic inversion;Delayed swallow initiation-pyriform sinuses;Reduced pharyngeal peristalsis;Reduced airway/laryngeal closure Pharyngeal Material enters airway, passes BELOW cords without attempt by patient to eject out (silent aspiration) Pharyngeal-  Thin Straw Pharyngeal residue - valleculae;Pharyngeal residue - pyriform;Penetration/Aspiration during swallow;Penetration/Aspiration before swallow;Reduced epiglottic inversion;Delayed swallow initiation-pyriform sinuses;Reduced pharyngeal peristalsis;Reduced airway/laryngeal closure;Moderate aspiration Pharyngeal Material enters airway, passes BELOW cords without attempt by patient to eject out (silent aspiration) Pharyngeal- Puree  Pharyngeal residue - valleculae Pharyngeal -- Pharyngeal- Mechanical Soft NT Pharyngeal -- Pharyngeal- Regular Pharyngeal residue - valleculae Pharyngeal -- Pharyngeal- Multi-consistency -- Pharyngeal -- Pharyngeal- Pill -- Pharyngeal -- Pharyngeal Comment --  CHL IP CERVICAL ESOPHAGEAL PHASE 05/25/2020 Cervical Esophageal Phase WFL Pudding Teaspoon -- Pudding Cup -- Honey Teaspoon -- Honey Cup -- Nectar Teaspoon -- Nectar Cup -- Nectar Straw -- Thin Teaspoon -- Thin Cup -- Thin Straw -- Puree -- Mechanical Soft -- Regular -- Multi-consistency -- Pill -- Cervical Esophageal Comment -- Amelia H. Roddie Mc, CCC-SLP Speech Language Pathologist Wende Bushy 05/25/2020, 4:05 PM              US ABDOMEN COMPLETE W/ELASTOGRAPHY  Result Date: 05/03/2020 CLINICAL DATA:  Fatty liver EXAM: ULTRASOUND ABDOMEN ULTRASOUND HEPATIC ELASTOGRAPHY TECHNIQUE: Sonography of the upper abdomen was performed. In addition, ultrasound elastography evaluation of the liver was performed. A region of interest was placed within the right lobe of the liver. Following application of a compressive sonographic pulse, tissue compressibility was assessed. Multiple assessments were performed at the selected site. Median tissue compressibility was determined. Previously, hepatic stiffness was assessed by shear wave velocity. Based on recently published Society of Radiologists in Ultrasound consensus article, reporting is now recommended to be performed in the SI units of pressure (kiloPascals) representing hepatic  stiffness/elasticity. The obtained result is compared to the published reference standards. (cACLD = compensated Advanced Chronic Liver Disease) COMPARISON:  02/23/2020 FINDINGS: ULTRASOUND ABDOMEN Gallbladder: Gallbladder partially contracted without evidence of gallstones or sonographic Murphy sign. Small amount of internal sludge is present. No wall thickening. Common bile duct: Diameter: 6 mm upper normal Liver: Echogenic hepatic parenchyma with nodular contour consistent with cirrhosis. No discrete hepatic mass. Portal vein is patent on color Doppler imaging with normal direction of blood flow towards the liver. IVC: Normal appearance Pancreas: Normal appearance Spleen: 11.9 cm length, calculated volume 399 mL.  No focal mass. Right Kidney: Length: 11.9 cm. Normal morphology without mass or hydronephrosis. Left Kidney: Length: 12.0 cm. Normal morphology without mass or hydronephrosis. Abdominal aorta: Normal caliber Other findings: Scattered ascites. ULTRASOUND HEPATIC ELASTOGRAPHY Device: Siemens Helix VTQ Patient position: Not recorded Transducer 5C1 Number of measurements: 10 Hepatic segment:  8 Median kPa: 95.8 IQR: 11.6 IQR/Median kPa ratio: 0.1 Data quality:  Good Diagnostic category: > or =17 kPa: highly suggestive of cACLD with an increased probability of clinically significant portal hypertension The use of hepatic elastography is applicable to patients with viral hepatitis and non-alcoholic fatty liver disease. At this time, there is insufficient data for the referenced cut-off values and use in other causes of liver disease, including alcoholic liver disease. Patients, however, may be assessed by elastography and serve as their own reference standard/baseline. In patients with non-alcoholic liver disease, the values suggesting compensated advanced chronic liver disease (cACLD) may be lower, and patients may need additional testing with elasticity results of 7-9 kPa. Please note that abnormal hepatic  elasticity and shear wave velocities may also be identified in clinical settings other than with hepatic fibrosis, such as: acute hepatitis, elevated right heart and central venous pressures including use of beta blockers, veno-occlusive disease (Budd-Chiari), infiltrative processes such as mastocytosis/amyloidosis/infiltrative tumor/lymphoma, extrahepatic cholestasis, with hyperemia in the post-prandial state, and with liver transplantation. Correlation with patient history, laboratory data, and clinical condition recommended. Diagnostic Categories: < or =5 kPa: high probability of being normal < or =9 kPa: in the absence of other known clinical signs, rules out cACLD >9 kPa and ?13 kPa: suggestive  of cACLD, but needs further testing >13 kPa: highly suggestive of cACLD > or =17 kPa: highly suggestive of cACLD with an increased probability of clinically significant portal hypertension IMPRESSION: ULTRASOUND ABDOMEN: Cirrhotic appearing liver without focal mass. ULTRASOUND HEPATIC ELASTOGRAPHY: Median kPa:  95.8 Diagnostic category: > or =17 kPa: highly suggestive of cACLD with an increased probability of clinically significant portal hypertension Electronically Signed   By: Lavonia Dana M.D.   On: 05/03/2020 17:09   ECHOCARDIOGRAM COMPLETE  Result Date: 05/15/2020    ECHOCARDIOGRAM REPORT   Patient Name:   ABRINA GOOTEE Date of Exam: 05/15/2020 Medical Rec #:  [id number]       Height:       60.0 in Accession #:    [id number]      Weight:       127.9 lb Date of Birth:  1965/08/21        BSA:          1.543 m Patient Age:    48 years        BP:           88/64 mmHg Patient Gender: F               HR:           95 bpm. Exam Location:  [facility] Procedure: 2D Echo, Cardiac Doppler and Color Doppler Indications:    Bacteremia R78.81  History:        Patient has prior history of Echocardiogram examinations, most                 recent 02/23/2020. Risk Factors:Dyslipidemia. Cardiac Arrest,                 Elevated Liver  Function Test.  Sonographer:    Alvino Chapel RCS Referring Phys: [id number] [place]  1. Left ventricular ejection fraction, by estimation, is 60 to 65%. The left ventricle has normal function. The left ventricle has no regional wall motion abnormalities. Left ventricular diastolic parameters are indeterminate.  2. Right ventricular systolic function is normal. The right ventricular size is normal.  3. Left atrial size was moderately dilated.  4. The mitral valve is normal in structure. No evidence of mitral valve regurgitation. No evidence of mitral stenosis.  5. The aortic valve has an indeterminant number of cusps. Aortic valve regurgitation is not visualized. No aortic stenosis is present.  6. The inferior vena cava is normal in size with greater than 50% respiratory variability, suggesting right atrial pressure of 3 mmHg. FINDINGS  Left Ventricle: Left ventricular ejection fraction, by estimation, is 60 to 65%. The left ventricle has normal function. The left ventricle has no regional wall motion abnormalities. The left ventricular internal cavity size was normal in size. There is  no left ventricular hypertrophy. Left ventricular diastolic parameters are indeterminate. Right Ventricle: The right ventricular size is normal. No increase in right ventricular wall thickness. Right ventricular systolic function is normal. Left Atrium: Left atrial size was moderately dilated. Right Atrium: Right atrial size was not well visualized. Pericardium: There is no evidence of pericardial effusion. Mitral Valve: The mitral valve is normal in structure. No evidence of mitral valve regurgitation. No evidence of mitral valve stenosis. Tricuspid Valve: The tricuspid valve is normal in structure. Tricuspid valve regurgitation is not demonstrated. No evidence of tricuspid stenosis. Aortic Valve: The aortic valve has an indeterminant number of cusps. Aortic valve regurgitation is not visualized. No aortic  stenosis is  present. Aortic valve mean gradient measures 5.1 mmHg. Aortic valve peak gradient measures 8.2 mmHg. Aortic valve area, by VTI measures 2.13 cm. Pulmonic Valve: The pulmonic valve was not well visualized. Pulmonic valve regurgitation is not visualized. No evidence of pulmonic stenosis. Aorta: The aortic root is normal in size and structure. Pulmonary Artery: Inadequate TR jet, indeterminant PASP. Venous: The inferior vena cava is normal in size with greater than 50% respiratory variability, suggesting right atrial pressure of 3 mmHg. IAS/Shunts: No atrial level shunt detected by color flow Doppler.  LEFT VENTRICLE PLAX 2D LVIDd:         4.80 cm  Diastology LVIDs:         3.10 cm  LV e' medial:    10.60 cm/s LV PW:         1.00 cm  LV E/e' medial:  11.3 LV IVS:        1.00 cm  LV e' lateral:   9.14 cm/s LVOT diam:     1.80 cm  LV E/e' lateral: 13.1 LV SV:         64 LV SV Index:   41 LVOT Area:     2.54 cm  RIGHT VENTRICLE RV S prime:     16.30 cm/s TAPSE (M-mode): 2.6 cm LEFT ATRIUM             Index       RIGHT ATRIUM           Index LA diam:        4.20 cm 2.72 cm/m  RA Area:     14.20 cm LA Vol (A2C):   53.8 ml 34.86 ml/m RA Volume:   36.10 ml  23.39 ml/m LA Vol (A4C):   64.2 ml 41.60 ml/m LA Biplane Vol: 62.5 ml 40.49 ml/m  AORTIC VALVE AV Area (Vmax):    2.40 cm AV Area (Vmean):   2.00 cm AV Area (VTI):     2.13 cm AV Vmax:           143.34 cm/s AV Vmean:          109.425 cm/s AV VTI:            0.301 m AV Peak Grad:      8.2 mmHg AV Mean Grad:      5.1 mmHg LVOT Vmax:         135.00 cm/s LVOT Vmean:        86.200 cm/s LVOT VTI:          0.251 m LVOT/AV VTI ratio: 0.84  AORTA Ao Root diam: 3.00 cm MITRAL VALVE MV Area (PHT): 3.97 cm     SHUNTS MV Decel Time: 191 msec     Systemic VTI:  0.25 m MV E velocity: 120.00 cm/s  Systemic Diam: 1.80 cm MV A velocity: 87.30 cm/s MV E/A ratio:  1.37 Carlyle Dolly MD Electronically signed by Carlyle Dolly MD Signature Date/Time: 05/15/2020/2:16:14  PM    Final    ECHO TEE  Result Date: 05/16/2020    TRANSESOPHOGEAL ECHO REPORT   Patient Name:   [name] Date of Exam: 05/16/2020 Medical Rec #:  [id number]       Height:       60.0 in Accession #:    [id number]      Weight:       127.9 lb Date of Birth:  02/03/1965        BSA:  1.543 m Patient Age:    59 years        BP:           123/85 mmHg Patient Gender: F               HR:           101 bpm. Exam Location:  [facility] Procedure: Transesophageal Echo, Cardiac Doppler and Color Doppler Indications:    Bacteremia  History:        Patient has prior history of Echocardiogram examinations, most                 recent 05/15/2020. Risk Factors:Dyslipidemia. Cardiac Arrest,                 Elevated Liver Function Test.  Sonographer:    Alvino Chapel RCS Referring Phys: [id number] [place]: The transesophogeal probe was passed without difficulty through the esophogus of the patient. Sedation performed by different physician. The patient developed no complications during the procedure. IMPRESSIONS  1. Left ventricular ejection fraction, by estimation, is 60 to 65%. The left ventricle has normal function.  2. Right ventricular systolic function is normal. The right ventricular size is normal.  3. Left atrial size was mildly dilated. No left atrial/left atrial appendage thrombus was detected. The LAA emptying velocity was 85 cm/s.  4. Right atrial size was mildly dilated.  5. The mitral valve is normal in structure. Trivial mitral valve regurgitation. No evidence of mitral stenosis.  6. The aortic valve is tricuspid. Aortic valve regurgitation is not visualized. No aortic stenosis is present. Conclusion(s)/Recommendation(s): No evidence of vegetation/infective endocarditis on this transesophageal echocardiogram. FINDINGS  Left Ventricle: Left ventricular ejection fraction, by estimation, is 60 to 65%. The left ventricle has normal function. The left ventricular internal cavity size was normal  in size. Right Ventricle: The right ventricular size is normal. No increase in right ventricular wall thickness. Right ventricular systolic function is normal. Left Atrium: Left atrial size was mildly dilated. No left atrial/left atrial appendage thrombus was detected. The LAA emptying velocity was 85 cm/s. Right Atrium: Right atrial size was mildly dilated. Pericardium: There is no evidence of pericardial effusion. Mitral Valve: The mitral valve is normal in structure. Trivial mitral valve regurgitation. No evidence of mitral valve stenosis. Tricuspid Valve: The tricuspid valve is normal in structure. Tricuspid valve regurgitation is trivial. No evidence of tricuspid stenosis. Aortic Valve: The aortic valve is tricuspid. Aortic valve regurgitation is not visualized. No aortic stenosis is present. Pulmonic Valve: The pulmonic valve was normal in structure. Pulmonic valve regurgitation is not visualized. No evidence of pulmonic stenosis. Aorta: The aortic root is normal in size and structure. IAS/Shunts: No atrial level shunt detected by color flow Doppler. Carlyle Dolly MD Electronically signed by Carlyle Dolly MD Signature Date/Time: 05/16/2020/1:28:24 PM    Final    [name] ASCITES (ABDOMEN LIMITED)  Result Date: 05/14/2020 CLINICAL DATA:  Ascites, question sufficient for paracentesis, clinical concern for spontaneous bacterial peritonitis EXAM: LIMITED ABDOMEN ULTRASOUND FOR ASCITES TECHNIQUE: Limited ultrasound survey for ascites was performed in all four abdominal quadrants. COMPARISON:  CT abdomen and pelvis 05/13/2020 FINDINGS: Small volume ascites identified in the lower quadrants bilaterally. Volume of ascites has probably slightly increased since prior CT. While the volume of fluid is insufficient for therapeutic paracentesis, a small volume of ascites may be obtainable for diagnostic purposes. Diagnostic paracentesis will be attempted. IMPRESSION: Small volume ascites, likely sufficient volume for  diagnostic paracentesis. Electronically  Signed   By: Lavonia Dana M.D.   On: 05/14/2020 11:11   [name] EKG SITE RITE  Result Date: 05/17/2020 If Site Rite image not attached, placement could not be confirmed due to current cardiac rhythm.   Barton Dubois, MD  [facility]  If 7PM-7AM, please contact night-coverage [email or web address] Password [facility] 05/26/2020, 6:26 PM   LOS: 13 days

## 2020-05-27 DIAGNOSIS — R569 Unspecified convulsions: Secondary | ICD-10-CM | POA: Diagnosis not present

## 2020-05-27 DIAGNOSIS — Z792 Long term (current) use of antibiotics: Secondary | ICD-10-CM | POA: Diagnosis not present

## 2020-05-27 DIAGNOSIS — R945 Abnormal results of liver function studies: Secondary | ICD-10-CM | POA: Diagnosis not present

## 2020-05-27 DIAGNOSIS — R4182 Altered mental status, unspecified: Secondary | ICD-10-CM | POA: Diagnosis not present

## 2020-05-27 DIAGNOSIS — R4189 Other symptoms and signs involving cognitive functions and awareness: Secondary | ICD-10-CM | POA: Diagnosis not present

## 2020-05-27 DIAGNOSIS — I10 Essential (primary) hypertension: Secondary | ICD-10-CM | POA: Diagnosis not present

## 2020-05-27 DIAGNOSIS — W19XXXA Unspecified fall, initial encounter: Secondary | ICD-10-CM | POA: Diagnosis not present

## 2020-05-27 DIAGNOSIS — Z043 Encounter for examination and observation following other accident: Secondary | ICD-10-CM | POA: Diagnosis not present

## 2020-05-27 DIAGNOSIS — R6520 Severe sepsis without septic shock: Secondary | ICD-10-CM | POA: Diagnosis not present

## 2020-05-27 DIAGNOSIS — K760 Fatty (change of) liver, not elsewhere classified: Secondary | ICD-10-CM | POA: Diagnosis not present

## 2020-05-27 DIAGNOSIS — R339 Retention of urine, unspecified: Secondary | ICD-10-CM | POA: Diagnosis not present

## 2020-05-27 DIAGNOSIS — Z7401 Bed confinement status: Secondary | ICD-10-CM | POA: Diagnosis not present

## 2020-05-27 DIAGNOSIS — Z91040 Latex allergy status: Secondary | ICD-10-CM | POA: Diagnosis not present

## 2020-05-27 DIAGNOSIS — M47812 Spondylosis without myelopathy or radiculopathy, cervical region: Secondary | ICD-10-CM | POA: Diagnosis not present

## 2020-05-27 DIAGNOSIS — R69 Illness, unspecified: Secondary | ICD-10-CM | POA: Diagnosis not present

## 2020-05-27 DIAGNOSIS — L89151 Pressure ulcer of sacral region, stage 1: Secondary | ICD-10-CM

## 2020-05-27 DIAGNOSIS — R41841 Cognitive communication deficit: Secondary | ICD-10-CM | POA: Diagnosis not present

## 2020-05-27 DIAGNOSIS — M6281 Muscle weakness (generalized): Secondary | ICD-10-CM | POA: Diagnosis not present

## 2020-05-27 DIAGNOSIS — A4101 Sepsis due to Methicillin susceptible Staphylococcus aureus: Secondary | ICD-10-CM | POA: Diagnosis not present

## 2020-05-27 DIAGNOSIS — Z96651 Presence of right artificial knee joint: Secondary | ICD-10-CM | POA: Diagnosis not present

## 2020-05-27 DIAGNOSIS — G40219 Localization-related (focal) (partial) symptomatic epilepsy and epileptic syndromes with complex partial seizures, intractable, without status epilepticus: Secondary | ICD-10-CM | POA: Diagnosis not present

## 2020-05-27 DIAGNOSIS — G40909 Epilepsy, unspecified, not intractable, without status epilepticus: Secondary | ICD-10-CM | POA: Diagnosis not present

## 2020-05-27 DIAGNOSIS — R6510 Systemic inflammatory response syndrome (SIRS) of non-infectious origin without acute organ dysfunction: Secondary | ICD-10-CM | POA: Diagnosis not present

## 2020-05-27 DIAGNOSIS — W19XXXD Unspecified fall, subsequent encounter: Secondary | ICD-10-CM | POA: Diagnosis not present

## 2020-05-27 DIAGNOSIS — B9561 Methicillin susceptible Staphylococcus aureus infection as the cause of diseases classified elsewhere: Secondary | ICD-10-CM | POA: Diagnosis not present

## 2020-05-27 DIAGNOSIS — J449 Chronic obstructive pulmonary disease, unspecified: Secondary | ICD-10-CM | POA: Diagnosis not present

## 2020-05-27 DIAGNOSIS — R1312 Dysphagia, oropharyngeal phase: Secondary | ICD-10-CM | POA: Diagnosis not present

## 2020-05-27 DIAGNOSIS — D649 Anemia, unspecified: Secondary | ICD-10-CM | POA: Diagnosis not present

## 2020-05-27 DIAGNOSIS — K7469 Other cirrhosis of liver: Secondary | ICD-10-CM | POA: Diagnosis not present

## 2020-05-27 DIAGNOSIS — K7290 Hepatic failure, unspecified without coma: Secondary | ICD-10-CM | POA: Diagnosis not present

## 2020-05-27 DIAGNOSIS — D689 Coagulation defect, unspecified: Secondary | ICD-10-CM | POA: Diagnosis not present

## 2020-05-27 DIAGNOSIS — Z743 Need for continuous supervision: Secondary | ICD-10-CM | POA: Diagnosis not present

## 2020-05-27 DIAGNOSIS — D329 Benign neoplasm of meninges, unspecified: Secondary | ICD-10-CM | POA: Diagnosis not present

## 2020-05-27 DIAGNOSIS — Z85841 Personal history of malignant neoplasm of brain: Secondary | ICD-10-CM | POA: Diagnosis not present

## 2020-05-27 DIAGNOSIS — G9341 Metabolic encephalopathy: Secondary | ICD-10-CM | POA: Diagnosis not present

## 2020-05-27 DIAGNOSIS — R2689 Other abnormalities of gait and mobility: Secondary | ICD-10-CM | POA: Diagnosis not present

## 2020-05-27 DIAGNOSIS — Z96642 Presence of left artificial hip joint: Secondary | ICD-10-CM | POA: Diagnosis not present

## 2020-05-27 DIAGNOSIS — R279 Unspecified lack of coordination: Secondary | ICD-10-CM | POA: Diagnosis not present

## 2020-05-27 DIAGNOSIS — R188 Other ascites: Secondary | ICD-10-CM | POA: Diagnosis not present

## 2020-05-27 DIAGNOSIS — R7401 Elevation of levels of liver transaminase levels: Secondary | ICD-10-CM | POA: Diagnosis not present

## 2020-05-27 DIAGNOSIS — Z79899 Other long term (current) drug therapy: Secondary | ICD-10-CM | POA: Diagnosis not present

## 2020-05-27 DIAGNOSIS — Y92129 Unspecified place in nursing home as the place of occurrence of the external cause: Secondary | ICD-10-CM | POA: Diagnosis not present

## 2020-05-27 DIAGNOSIS — R404 Transient alteration of awareness: Secondary | ICD-10-CM | POA: Diagnosis not present

## 2020-05-27 DIAGNOSIS — S0990XA Unspecified injury of head, initial encounter: Secondary | ICD-10-CM | POA: Diagnosis not present

## 2020-05-27 DIAGNOSIS — G894 Chronic pain syndrome: Secondary | ICD-10-CM | POA: Diagnosis not present

## 2020-05-27 LAB — BASIC METABOLIC PANEL
Anion gap: 10 (ref 5–15)
BUN: 22 mg/dL — ABNORMAL HIGH (ref 6–20)
CO2: 21 mmol/L — ABNORMAL LOW (ref 22–32)
Calcium: 7.5 mg/dL — ABNORMAL LOW (ref 8.9–10.3)
Chloride: 107 mmol/L (ref 98–111)
Creatinine, Ser: 0.64 mg/dL (ref 0.44–1.00)
GFR, Estimated: >60 mL/min (ref >60)
Glucose, Bld: 98 mg/dL (ref 70–99)
Potassium: 3.4 mmol/L — ABNORMAL LOW (ref 3.5–5.1)
Sodium: 138 mmol/L (ref 135–145)

## 2020-05-27 LAB — CULTURE, BLOOD (ROUTINE X 2)
Culture: NO GROWTH
Culture: NO GROWTH
Special Requests: ADEQUATE

## 2020-05-27 LAB — CBC
HCT: 25.7 % — ABNORMAL LOW (ref 36.0–46.0)
Hemoglobin: 8.6 g/dL — ABNORMAL LOW (ref 12.0–15.0)
MCH: 30.7 pg (ref 26.0–34.0)
MCHC: 33.5 g/dL (ref 30.0–36.0)
MCV: 91.8 fL (ref 80.0–100.0)
Platelets: 234 10*3/uL (ref 150–400)
RBC: 2.8 MIL/uL — ABNORMAL LOW (ref 3.87–5.11)
RDW: 19.4 % — ABNORMAL HIGH (ref 11.5–15.5)
WBC: 12.4 10*3/uL — ABNORMAL HIGH (ref 4.0–10.5)
nRBC: 0 % (ref 0.0–0.2)

## 2020-05-27 MED ORDER — LACTULOSE 10 GM/15ML PO SOLN
20.0000 g | Freq: Three times a day (TID) | ORAL | 0 refills | Status: AC
Start: 2020-05-27 — End: ?

## 2020-05-27 MED ORDER — LEVETIRACETAM 100 MG/ML PO SOLN
1000.0000 mg | Freq: Two times a day (BID) | ORAL | 12 refills | Status: AC
Start: 2020-05-27 — End: ?

## 2020-05-27 MED ORDER — FOOD THICKENER (SIMPLYTHICK): 1.0000 | ORAL | Status: AC | PRN

## 2020-05-27 MED ORDER — SENNOSIDES-DOCUSATE SODIUM 8.6-50 MG PO TABS: 1.0000 | ORAL_TABLET | Freq: Two times a day (BID) | ORAL | Status: AC

## 2020-05-27 MED ORDER — FUROSEMIDE 40 MG PO TABS: 40.0000 mg | ORAL_TABLET | Freq: Every day | ORAL | 1 refills | Status: AC

## 2020-05-27 NOTE — TOC Transition Note (Signed)
Transition of Care [facility]) - CM/SW Discharge Note   Patient Details  Name: [name] MRN: [id number] Date of Birth: 1965/08/28  [facility]) CM/SW Contact:  Natasha Bence, LCSW Phone Number: 05/27/2020, 12:34 PM   Clinical Narrative:    Jackelyn Poling with [facility] agreeable to take patient. CSW completed med necessity, faxed discharge summary and called EMS. Nurse to call report. TOC signing off.   Final next level of care: Skilled Nursing Facility Barriers to Discharge: Barriers Resolved   Patient Goals and CMS Choice Patient states their goals for this hospitalization and ongoing recovery are:: Rehab with SNF CMS Medicare.gov Compare Post Acute Care list provided to:: Patient Choice offered to / list presented to : Patient  Discharge Placement              Patient chooses bed at: [facility] Patient to be transferred to facility by: [facility] Name of family member notified: [name] Patient and family notified of of transfer: 05/27/20  Discharge Plan and Services                DME Arranged: N/A DME Agency: NA       HH Arranged: NA HH Agency: NA Date HH Agency Contacted: 05/14/20 Time Outlook: [place] Representative spoke with at [place]: [place] Determinants of Health ([place]) Interventions     Readmission Risk Interventions Readmission Risk Prevention Plan 05/14/2020 02/24/2020 02/14/2020  Medication Screening - Complete Complete  Transportation Screening Complete Complete Complete  Home Care Screening Complete - -  Medication Review (RN CM) Complete - -  Some recent data might be hidden

## 2020-05-27 NOTE — Discharge Summary (Signed)
Physician Discharge Summary  [name] RSW:[id number] DOB: March 03, 1965 DOA: 05/12/2020  PCP: [name], MD  Admit date: 05/12/2020 Discharge date: 05/27/2020  Time spent: 35 minutes  Recommendations for Outpatient Follow-up:  1. Repeat CBC to follow hemoglobin trend in 1 week 2. Repeat basic metabolic panel to follow electrolytes and renal function in 1 week 3. Repeat LFTs in 1 week to follow trend and stability. 4. Will recommend goals of care discussion and advance planning care based on patient's further clinical response.   Discharge Diagnoses:  Principal Problem:   SIRS (systemic inflammatory response syndrome) (HCC) Active Problems:   Seizure disorder (HCC)   Elevated liver function tests   Meningioma (HCC)   Acute metabolic encephalopathy   Fall at home, initial encounter   Hepatic encephalopathy ([place])   Transaminasemia   Rib injury   Other cirrhosis of liver ([place])   Gastric varices   Fever   Acute respiratory failure with hypoxia ([place])   Ascites   Severe sepsis with acute organ dysfunction due to methicillin susceptible Staphylococcus aureus (MSSA) ([place])   Pressure injury of skin   Discharge Condition: Stable and overall improved; patient has been transferred to a skilled nursing facility ([facility]) for further care, rehabilitation and conditioning.  CODE STATUS: Full code  Diet recommendation: Low-sodium, regular consistency, with nectar thick liquids.  Filed Weights   05/25/20 0500 05/26/20 0456 05/27/20 0524  Weight: 61 kg 61.6 kg 62.8 kg    History of present illness:  55 year old female with a history of COPD, meningioma, cardiac arrest December 2020, hepatic steatosis,PNES, cognitive impairment, depression, complex partial seizure presentingwithgeneralized weakness and a fall.This has been a recurrent problem for the patient. She was admitted on 2/7 to 02/24/20 with a similar presentation. At that time she was treated for acute  metabolic encephalopathy which was thought to be multifactorial including elevated ammonia, dehydration and dopamine agonist. She was discharged home with HHPT. She follow up with [facility], Dr. 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 She underwent liver elastography on 05/03/20 which suggested cACLD with increased probability of clinically significant portal HTN. She presented on 05/12/20 with another mechanical fall.She reports that her house is being remodeled and she tripped on something and fell forward hit her head on the railing and landed on her right chest. She reports that she hit the back of her head on the way down. She had no loss of consciousness. At presentation her temperature was 101.Blood cultures were done and she was found to have MSSA bacteremia. Also CT of the abd/pelvis on 05/13/20 showed signs of cirrhosis with portal HTN. She also had splenomegaly. THere was also a mildly thickened cecum and ascending colon with diffuse edematous appearance of the sm bowel. She underwent TEE on 05/16/20 after which she remained hypoxic on NRB.  TEE was negative for vegetation. She was then transferred to SDU for further care.She was started on IV lasix with clinical improvement.  Hospital Course: Severe Sepsis -present on admission -presented with fever, tachycardia, tachypnea, elevated lactate -due to MSSA bacteremia -Lactic peaked 2.2>>1.8>>2.6 (maybe in the setting of dehydration); IVF's added. -UA 6-10 WBC -4/30CXR--no consolidation -Random cortisol level 17.1 -Continue supportive care and follow clinical response -WBCs down to 112.4K; patient now afebrile for 24 hours.  Mild hypernatremia and elevated lactic acid -improved with addition of IVF's -follow clinical response and ability to maintain oral intake.  MSSA Bacteremia -05/12/20 blood culture--positive MSSA -05/14/20  blood cuture--neg to date -continue  cefazolin -05/16/20 TEE--no vegetation -PICC line 5/6 if surveillance blood cultures remain neg -plan is to continue abx till 5/29  Hepatic Encephalopathy/Acute metabolic Encephalopathy -Over last 48 hours, pt more confused, slower to answer questions -multifactorial including opioids, hypnotic meds, elevated ammonia -UA--neg for pyuria -05/22/20 ammonia--64>>60>>49 -Continue lactulose at current dose.  Acute respiratory failure with hypoxia -oxygen saturation 88-90% on NRB in PACU 5/4 -personally reviewed CXR--bilateral interstitial infiltrates -5/4/22ABG 7.471/32/57/24 (0.8) -05/15/20 Echo=EF 60-65%, no WMA, indeterminant diastolic -now down to RA and with no signs of resp distress  -plan todischarge on lasix 40 mg po daily -Continue to follow daily weights and strict I's and O's. -follow low sodium diet   Chronic pain -d/c dilaudid due to drowsiness -5/8 at 0120--started on fentanyl due to n/v -5/8 afternoon--remains drowsy -5/8 AM RN reports patient tolerating diet without n/v -d/c fentanyl -restartedoxycodone>>cut to 1/2 dose; follow clinical response.  NAFLD/Liver Cirrhosis -05/13/20 CT abd as discussed above -05/14/20 paracentesis--WBC 51, only 20 cc removed -remains mildly hypervolemic; will continue lasix -Continue lactulose at current dose. -Mentation continues slowly but steadily to improved -Will continue to follow intermittently ammonia level -ammonia level 40  Colonic Wall Thickening -?portal HTN colopathy -Tolerating diet -Mild vague discomfort reported after lactulose initiated -good response to bowel regimen; positive BM's reported -will continue following.   Gait instability/frequent falls/dizziness -PT evaluation-->SNF has been recommended. -CT brain negativeacute findings -No focal deficits on exam -5/3/22echo--EF 60-65%, no WMA -No abnormalities appreciated on telemetry.  Anemia of chronic disease -1 unit PRBC's transfusion needed  -follow  Hgb trend  Complex partial seizure/PNES -Continue home dose of Keppra and Lamictal -No active seizure appreciated currently. -seizure precaution in place.   Depression -Continue fluoxetine 80 mg daily -Continue as needed lorazepam  Meningioma -Patient follows Dr. [name] -s/p fractionated radiosurgery Sept 2020--Dr. [name] -01/18/20 MR brain--mass is stable in size as compared to the brain MRI of 09/13/2019 -no focal deficits on exam  Hypokalemia -Continue as needed repletion -Will continue to follow electrolytes intermittently.  Transaminasemia -gradual rise due to hemodynamic changes (soft BP) in the setting of NAFLD and hepatic congestion (fluid overload) -05/14/20 RUQ US--Nodular contour to the liver suggesting cirrhosis. Mild ascites. Mild sludge in otherwise normal appearing gallbladder. A small amount of fluid adjacent to the gallbladder is likely due to the ascites. -05/13/20 viral hepatitis serology--neg -Continue to follow LFTs trend intermittently   Restless Leg -previously discontinued mirapex due to dopamine agonist causing confusion -Overall confusion improved -No complaining of urinary cramps. -Continue holding Mirapex.   Procedures:  See below for x-ray reports.  Consultations:  None  Discharge Exam: Vitals:   05/26/20 2014 05/27/20 0524  BP: 113/78 101/70  Pulse: 86 93  Resp: 20 18  Temp: 98.7 F (37.1 C) 97.9 F (36.6 C)  SpO2: 96% 93%   Overall unchanged from my physical examination on 05/26/2020, except as otherwise mentioned below.  General exam: Alert, awake, oriented x 2; afebrile for 48 hours currently; no chest pain, no nausea, no vomiting and is able to follow commands appropriately.  Reports significant improvement in her abdomen discomfort.  Positive bowel movement overnight. Respiratory system: Good air movement bilaterally, no wheezing, no crackles, no using accessory muscles.   Cardiovascular system:RRR. No murmurs, rubs,  gallops. Gastrointestinal system: Abdomen is mildly distended, soft and nontender. No organomegaly or masses felt. Normal bowel sounds heard. Central nervous system: Alert and oriented. No focal neurological deficits. Extremities: No cyanosis or clubbing.  Bruising/hematoma on  her left posterior lateral thigh appreciated; appears to be stable. Skin: No rashes, no petechiae.  Present on admission stage I sacral decubitus injury; no signs of superimposed infection.Marland Kitchen Psychiatry: Mood & affect appropriate.    Discharge Instructions   Discharge Instructions    [facility] pharmacist to adjust dose for Vancomycin, Aminoglycosides and other anti-infective therapies as requested by physician.   Complete by: As directed    [facility] to provide Cath Flo 75m   Complete by: As directed    Administer for PICC line occlusion and as ordered by physician for other access device issues.   Anaphylaxis Kit: Provided to treat any anaphylactic reaction to the medication being provided to the patient if First Dose or when requested by physician   Complete by: As directed    Epinephrine 168mml vial / amp: Administer 0.59m26m0.59ml62mubcutaneously once for moderate to severe anaphylaxis, nurse to call physician and pharmacy when reaction occurs and call 911 if needed for immediate care   Diphenhydramine 50mg359mIV vial: Administer 25-50mg 77mM PRN for first dose reaction, rash, itching, mild reaction, nurse to call physician and pharmacy when reaction occurs   Sodium Chloride 0.9% NS 500ml I32mdminister if needed for hypovolemic blood pressure drop or as ordered by physician after call to physician with anaphylactic reaction   Change dressing on IV access line weekly and PRN   Complete by: As directed    Diet - low sodium heart healthy   Complete by: As directed    Discharge instructions   Complete by: As directed    Maintain adequate hydration Follow low-sodium diet (less than 2 g  daily) Check weight on daily basis and closely follow patient's intake. Continue antibiotic therapy as instructed until Jun 10, 2020 Rehabilitation, follow-up by speech therapy and conditioning instructions as per nursing home protocol. Follow regular diet consistency with nectar thick liquids.   Discharge wound care:   Complete by: As directed    No open wounds appreciated; patient with a stage I pressure injury in mid sacrum; will benefit of constant repositioning and barrier therapy.   Flush IV access with Sodium Chloride 0.9% and Heparin 10 units/ml or 100 units/ml   Complete by: As directed    Home infusion instructions - [facility]   Complete by: As directed    Instructions: Flush IV access with Sodium Chloride 0.9% and Heparin 10units/ml or 100units/ml   Change dressing on IV access line: Weekly and PRN   Instructions Cath Flo 2mg: Ad759mister for PICC Line occlusion and as ordered by physician for other access device   [facility] pharmacist to adjust dose for: Vancomycin, Aminoglycosides and other anti-infective therapies as requested by physician   Increase activity slowly   Complete by: As directed    Method of administration may be changed at the discretion of home infusion pharmacist based upon assessment of the patient and/or caregiver's ability to self-administer the medication ordered   Complete by: As directed      Allergies as of 05/27/2020      Reactions   Acetaminophen Other (See Comments)   Has fatty deposits on liver   Benadryl [diphenhydramine Hcl] Other (See Comments)   hyperactivity and seizures   Codeine Itching, Rash   seizures   Dilantin [phenytoin Sodium Extended] Other (See Comments)   Elevated LFT's   Melatonin Other (See Comments)   seizures   Tramadol Other (See Comments)   "causes seizures"   Ultram [tramadol Hcl] Other (  See Comments)   Seizures   Vimpat [lacosamide] Other (See Comments)   Severe dizziness   Mirtazapine  Other (See Comments)   Potentiated seizure activity   Betadine [povidone Iodine] Rash   Latex Rash   Penicillins Itching, Rash   Patient can tolerate cephalosporins   Sulfa Antibiotics Rash   Tape Rash, Other (See Comments)   Paper tape please Paper tape please   Vicodin [hydrocodone-acetaminophen] Itching      Medication List    STOP taking these medications   levETIRAcetam 1000 MG tablet Commonly known as: KEPPRA Replaced by: levETIRAcetam 100 MG/ML solution   pramipexole 0.5 MG tablet Commonly known as: MIRAPEX     TAKE these medications   ceFAZolin  IVPB Commonly known as: ANCEF Inject 2 g into the vein every 8 (eight) hours for 24 days. Indication:  MSSA bacteremia  First Dose: Yes Last Day of Therapy:  Jun 10, 2020 Labs - Once weekly:  CBC/D and BMP, Labs - Every other week:  ESR and CRP Method of administration: IV Push Method of administration may be changed at the discretion of home infusion pharmacist based upon assessment of the patient and/or caregiver's ability to self-administer the medication ordered.   FLUoxetine 40 MG capsule Commonly known as: PROZAC Take 2 capsules (80 mg total) by mouth daily.   folic acid 1 MG tablet Commonly known as: FOLVITE Take 1 tablet (1 mg total) by mouth daily.   food thickener Gel Commonly known as: SIMPLYTHICK (NECTAR/LEVEL 2/MILDLY THICK) Take 1 packet by mouth as needed (as needed for levetiracetam solution administration).   furosemide 40 MG tablet Commonly known as: LASIX Take 1 tablet (40 mg total) by mouth daily. Start taking on: May 28, 2020   lactulose 10 GM/15ML solution Commonly known as: CHRONULAC Take 30 mLs (20 g total) by mouth 3 (three) times daily.   lamoTRIgine 150 MG tablet Commonly known as: LAMICTAL Take 1 tablet (150 mg total) by mouth 2 (two) times daily. For mood control What changed: additional instructions   levETIRAcetam 100 MG/ML solution Commonly known as: KEPPRA Take 10 mLs  (1,000 mg total) by mouth 2 (two) times daily. Replaces: levETIRAcetam 1000 MG tablet   LORazepam 1 MG tablet Commonly known as: ATIVAN Take 1 tablet (1 mg total) by mouth daily as needed for anxiety.   metoprolol tartrate 25 MG tablet Commonly known as: LOPRESSOR Take 0.5 tablets (12.5 mg total) by mouth 2 (two) times daily.   senna-docusate 8.6-50 MG tablet Commonly known as: Senokot-S Take 1 tablet by mouth 2 (two) times daily.            Discharge Care Instructions  (From admission, onward)         Start     Ordered   05/27/20 0000  Discharge wound care:       Comments: No open wounds appreciated; patient with a stage I pressure injury in mid sacrum; will benefit of constant repositioning and barrier therapy.   05/27/20 1208   05/17/20 0000  Change dressing on IV access line weekly and PRN  (Home infusion instructions - [facility] )        05/17/20 1543         Allergies  Allergen Reactions  . Acetaminophen Other (See Comments)    Has fatty deposits on liver  . Benadryl [Diphenhydramine Hcl] Other (See Comments)    hyperactivity and seizures  . Codeine Itching and Rash    seizures  . Dilantin [Phenytoin  Sodium Extended] Other (See Comments)    Elevated LFT's  . Melatonin Other (See Comments)    seizures  . Tramadol Other (See Comments)    "causes seizures"  . Ultram [Tramadol Hcl] Other (See Comments)    Seizures  . Vimpat [Lacosamide] Other (See Comments)    Severe dizziness  . Mirtazapine Other (See Comments)    Potentiated seizure activity  . Betadine [Povidone Iodine] Rash  . Latex Rash  . Penicillins Itching and Rash    Patient can tolerate cephalosporins  . Sulfa Antibiotics Rash  . Tape Rash and Other (See Comments)    Paper tape please Paper tape please  . Vicodin [Hydrocodone-Acetaminophen] Itching    Contact information for follow-up providers    [facility] Follow up.   Why: Will call to set up first home  visit.           Contact information for after-discharge care    [facility] .   Service: Skilled Nursing Contact information: [address] [place] [phone]                  The results of significant diagnostics from this hospitalization (including imaging, microbiology, ancillary and laboratory) are listed below for reference.    Significant Diagnostic Studies: CT HEAD WO CONTRAST  Result Date: 05/24/2020 CLINICAL DATA:  55 year old female with altered mental status. EXAM: CT HEAD WITHOUT CONTRAST TECHNIQUE: Contiguous axial images were obtained from the base of the skull through the vertex without intravenous contrast. COMPARISON:  Head CT dated 05/12/2020. FINDINGS: Evaluation of this exam is limited due to motion artifact. Brain: The ventricles and sulci appropriate size for patient's age. The gray-white matter discrimination is preserved. There is no acute intracranial hemorrhage. No mass effect or midline shift. No extra-axial fluid collection. Vascular: No hyperdense vessel or unexpected calcification. Skull: Normal. Negative for fracture or focal lesion. Sinuses/Orbits: No acute finding. Other: Mild left forehead contusion. IMPRESSION: No acute intracranial pathology. Electronically Signed   By: Anner Crete M.D.   On: 05/24/2020 02:17   CT HEAD WO CONTRAST  Result Date: 05/13/2020 CLINICAL DATA:  Fall 2 days prior EXAM: CT HEAD WITHOUT CONTRAST TECHNIQUE: Contiguous axial images were obtained from the base of the skull through the vertex without intravenous contrast. COMPARISON:  CT 02/20/2020 FINDINGS: Brain: No evidence of acute infarction, hemorrhage, hydrocephalus, extra-axial collection, visible mass lesion or mass effect. Vascular: Atherosclerotic calcification of the carotid siphons. No hyperdense vessel. Skull: No calvarial fracture or suspicious osseous lesion. No scalp swelling or  hematoma. Sinuses/Orbits: Paranasal sinuses are predominantly clear. Mastoid air cells appear chronically hypo pneumatized. Middle ear cavities are clear. Debris in the bilateral external auditory canals. Included orbital structures are unremarkable. Other: Asymmetric thickening and calcification in the left parotid gland is a chronic, nonspecific finding, similar to comparison study. IMPRESSION: No acute intracranial abnormality. No calvarial fracture, significant scalp swelling or hematoma. Intracranial atherosclerosis. Asymmetric thickening and calcification in the left parotid gland, a nonspecific, chronic finding unchanged from prior. Can reflect sequela of a chronic inflammatory process. Debris in the external auditory canals, correlate for cerumen impaction. Electronically Signed   By: Lovena Le M.D.   On: 05/13/2020 00:44   CT ABDOMEN PELVIS W CONTRAST  Result Date: 05/13/2020 CLINICAL DATA:  Abdominal pain, fever, right upper quadrant pain EXAM: CT ABDOMEN AND PELVIS WITH CONTRAST TECHNIQUE: Multidetector CT imaging of the abdomen and pelvis was performed using the  standard protocol following bolus administration of intravenous contrast. CONTRAST:  151m OMNIPAQUE IOHEXOL 300 MG/ML  SOLN COMPARISON:  CT 02/12/2020 FINDINGS: Lower chest: Basilar atelectatic changes in the lungs including more bandlike areas of subsegmental atelectasis or scarring. Normal heart size. No pericardial effusion. Hepatobiliary: Heterogeneous hepatic attenuation with a nodular liver surface contour concerning for intrinsic liver disease/cirrhosis. No concerning focal liver lesion. Moderate gallbladder distension. No significant gallbladder wall thickening or pericholecystic inflammation is seen. No visible calcified gallstones or biliary ductal dilatation. Pancreas: Slightly edematous appearance of the pancreatic parenchyma with very faint peripancreatic haze, uniform enhancement. No pancreatic ductal dilatation. Spleen: Mild  splenomegaly.  No concerning focal splenic lesion. Adrenals/Urinary Tract: Normal adrenals. Kidneys enhance symmetrically. Symmetric and uniform excretion. No concerning focal renal lesion. Bilateral renal cortical scarring. Nonobstructing calculi present in both kidneys. No obstructive urolithiasis or hydronephrosis is seen at this time. Bladder is unremarkable for the degree of distention. Stomach/Bowel: Paraesophageal venous collaterals and varices. Some minimal collateralization and varices about the gastric fundus as well. Thickening towards the gastric antrum likely reflecting normal peristalsis. Air and fluid-filled appearance of the duodenum with a normal sweep across the midline abdomen. Slightly edematous appearance diffusely through the small bowel is nonspecific given additional findings above. Moderate right-sided colonic stool burden, particularly within the cecum. Some mild circumferential thickening and mucosal hyperemia about the cecum and ascending colon is nonspecific given hepatic features. No evidence of mechanical obstruction. Appendix is not well visualized. Vascular/Lymphatic: Atherosclerotic calcifications within the abdominal aorta and branch vessels. No aneurysm or ectasia. Paraesophageal and gastric varices with venous collaterals, as above. Splenorenal collateralization. Upper abdominal varices.No enlarged abdominopelvic lymph nodes. Few scattered calcified lymph nodes are present in the abdomen and pelvis. Reproductive: Portion the pelvis obscured by streak artifact. 2.6 cm cystic focus in the left adnexa, unchanged from comparison. No concerning right adnexal lesion. Prior hysterectomy. Other: Increasing edematous changes of the central mesentery. Small volume low-attenuation fluid in the deep pelvis as well as trace perihepatic ascites. Circumferential body wall edema. No bowel containing hernia. No free abdominopelvic air. Musculoskeletal: Multilevel degenerative changes are present  in the imaged portions of the spine. Grade 1 anterolisthesis L4 on L5 without spondylolysis, unchanged from prior. Prior left hip arthroplasty with chronic bony remodeling and a similar appearance overall to comparison prior. IMPRESSION: Constellation of features suggestive of intrinsic liver disease/cirrhosis with features of portal hypertension including a nodular, heterogeneous liver with paraesophageal and gastric varices, additional upper abdominal venous collateralization, splenorenal shunting and splenomegaly. Interval development of a small volume ascites as well as additional features of at least mild or early developing anasarca with circumferential body wall edema and central mesenteric edema. Some hazy peripancreatic stranding may be present, possibly related to a diffusely edematous appearance of the mesentery though could correlate with lipase. Mildly thickened appearance of the cecum and ascending colon as well as a diffusely edematous appearance of the small bowel is nonspecific in the setting of suspected intrinsic liver disease. Could reflect portal enteropathy/colopathy though should exclude clinical symptoms of enterocolitis. Bilateral nonobstructing nephrolithiasis. No obstructive urolithiasis or hydronephrosis at this time. Bilateral renal cortical scarring. Prior hysterectomy. Stable 2.7 cm left ovarian cyst. Continued stability favoring a benign process. No follow-up imaging recommended. Note: This recommendation does not apply to premenarchal patients and to those with increased risk (genetic, family history, elevated tumor markers or other high-risk factors) of ovarian cancer. Reference: JACR 2020 Feb; 17(2):248-254 Aortic Atherosclerosis (ICD10-I70.0). Electronically Signed   By: PElwin SleightD.  On: 05/13/2020 00:35   US Abdomen Limited  Result Date: 05/13/2020 CLINICAL DATA:  Right upper quadrant pain. EXAM: ULTRASOUND ABDOMEN LIMITED RIGHT UPPER QUADRANT COMPARISON:  February 23, 2020 ultrasound.  CT scan May 12, 2020. FINDINGS: Gallbladder: A small amount of sludge is seen in the gallbladder. No stones, Murphy's sign, or wall thickening. A small amount of ascites is identified, including adjacent to the gallbladder. Common bile duct: Diameter: 5 mm Liver: There is a nodular contour in the liver. No focal mass. Portal vein is patent on color Doppler imaging with normal direction of blood flow towards the liver. Other: Mild ascites. IMPRESSION: 1. Nodular contour to the liver suggesting cirrhosis.  Mild ascites. 2. Mild sludge in otherwise normal appearing gallbladder. A small amount of fluid adjacent to the gallbladder is likely due to the ascites. Electronically Signed   By: Dorise Bullion III M.D   On: 05/13/2020 09:06   US Paracentesis  Result Date: 05/14/2020 INDICATION: Fever, abdominal pain, question spontaneous bacterial peritonitis, ascites on CT EXAM: ULTRASOUND GUIDED DIAGNOSTIC PARACENTESIS MEDICATIONS: None COMPLICATIONS: None immediate PROCEDURE: Informed written consent was obtained from the patient after a discussion of the risks, benefits and alternatives to treatment. A timeout was performed prior to the initiation of the procedure. Initial ultrasound scanning demonstrates a small amount of ascites within the right lower abdominal quadrant. The right lower abdomen was prepped and draped in the usual sterile fashion. 1% lidocaine was used for local anesthesia. Following this, a 5 [place] Yueh catheter was introduced. An ultrasound image was saved for documentation purposes. The paracentesis was performed. The catheter was removed and a dressing was applied. The patient tolerated the procedure well without immediate post procedural complication. FINDINGS: A total of approximately 20 mL of clear yellow ascitic fluid was removed. Samples were sent to the laboratory as requested by the clinical team. IMPRESSION: Successful ultrasound-guided paracentesis yielding 20 mL of  peritoneal fluid for diagnostic testing. Electronically Signed   By: Lavonia Dana M.D.   On: 05/14/2020 12:39   DG CHEST PORT 1 VIEW  Result Date: 05/24/2020 CLINICAL DATA:  55 year old female with tachycardia. EXAM: PORTABLE CHEST 1 VIEW COMPARISON:  Chest radiograph dated 05/22/2020. FINDINGS: Right-sided PICC with tip at the cavoatrial junction. No interval change in bilateral interstitial reticulonodular densities. No pleural effusion or pneumothorax. Stable cardiomediastinal silhouette. No acute osseous pathology. IMPRESSION: No interval change in the bilateral interstitial reticulonodular densities. Electronically Signed   By: Anner Crete M.D.   On: 05/24/2020 01:33   DG CHEST PORT 1 VIEW  Result Date: 05/22/2020 CLINICAL DATA:  Cough EXAM: PORTABLE CHEST 1 VIEW COMPARISON:  Chest radiograph dated 05/20/2020. FINDINGS: The heart size and mediastinal contours are within normal limits. Moderate bilateral interstitial and airspace opacities appear unchanged since prior exam. A right upper extremity peripherally inserted central venous catheter tip overlies the superior cavoatrial junction. IMPRESSION: Moderate bilateral interstitial and airspace opacities are not significantly changed and likely represent pulmonary edema. Electronically Signed   By: Zerita Boers M.D.   On: 05/22/2020 16:46   DG CHEST PORT 1 VIEW  Result Date: 05/20/2020 CLINICAL DATA:  Pulmonary edema EXAM: PORTABLE CHEST 1 VIEW COMPARISON:  05/16/2020 chest radiograph. FINDINGS: Right PICC terminates over the right atrium. Stable cardiomediastinal silhouette with normal heart size. No pneumothorax. No pleural effusion. Patchy hazy opacities throughout both lungs, significantly improved. IMPRESSION: Significantly improved patchy hazy opacities throughout both lungs, compatible with improving pulmonary edema. Electronically Signed   By: Janina Mayo.D.  On: 05/20/2020 07:32   DG CHEST PORT 1 VIEW  Result Date:  05/16/2020 CLINICAL DATA:  Shortness of breath. EXAM: PORTABLE CHEST 1 VIEW COMPARISON:  Chest x-ray dated May 12, 2020. FINDINGS: The heart size and mediastinal contours are within normal limits. Prominent diffuse interstitial thickening with bilateral perihilar opacities. Small bilateral pleural effusions. No pneumothorax. No acute osseous abnormality. IMPRESSION: 1. Moderate to severe pulmonary edema with small bilateral pleural effusions. Electronically Signed   By: Titus Dubin M.D.   On: 05/16/2020 12:57   DG Chest Portable 1 View  Result Date: 05/12/2020 CLINICAL DATA:  Right sided rib pain. EXAM: PORTABLE CHEST 1 VIEW COMPARISON:  February 12, 2020 FINDINGS: A very mild amount of linear atelectasis is seen within the right lung base. There is no evidence of acute infiltrate, pleural effusion or pneumothorax. The heart size and mediastinal contours are within normal limits. A chronic seventh right rib fracture is seen. IMPRESSION: 1. Very mild right basilar linear atelectasis. 2. Chronic seventh right rib fracture. Electronically Signed   By: Virgina Norfolk M.D.   On: 05/12/2020 23:16   DG Swallowing Func-Speech Pathology  Result Date: 05/25/2020 Objective Swallowing Evaluation: Type of Study: MBS-Modified Barium Swallow Study  Patient Details Name: MILIYAH LUPER MRN: [id number] Date of Birth: [birth year] Today's Date: 05/25/2020 Time: SLP Start Time (ACUTE ONLY): 1436 -SLP Stop Time (ACUTE ONLY): 1505 SLP Time Calculation (min) (ACUTE ONLY): 29 min Past Medical History: Past Medical History: Diagnosis Date . Arthritis   "knees" (04/22/2012) . Brain tumor (benign) ([place])  . Chronic bronchitis ([place])   "yearly; when the weather changes" (04/22/2012) . Colon polyp  . Colon polyps   adenomatous and hyperplastic- . Depression  . Eczema  . Epilepsy ([place])   "been having them right often here lately" (04/22/2012) . Fatty liver  . High cholesterol  . History of kidney stones  . Osteoarthritis   [name] 04/22/2012  . Other convulsions 05/21/12  non-epileptic spells . Restless leg  . Seizures ([place])  . Vertigo  Past Surgical History: Past Surgical History: Procedure Laterality Date . ABDOMINAL HYSTERECTOMY  2001 . BLADDER SUSPENSION   . BUNIONECTOMY Left 2000 . CESAREAN SECTION  1987; 1988 . COLONOSCOPY   . JOINT REPLACEMENT   . MASS EXCISION  10/22/2011  Procedure: EXCISION MASS;  Surgeon: [name], MD;  Location: [facility];  Service: General;  Laterality: Right;  excision right buttock mass . TEE WITHOUT CARDIOVERSION N/A 05/16/2020  Procedure: TRANSESOPHAGEAL ECHOCARDIOGRAM (TEE) WITH PROPOFOL;  Surgeon: Arnoldo Lenis, MD;  Location: [facility];  Service: Endoscopy;  Laterality: N/A; . TOTAL HIP ARTHROPLASTY Left 1993; 1995; 2000 . TOTAL KNEE ARTHROPLASTY Left 05/07/2015  Procedure: TOTAL LEFT KNEE ARTHROPLASTY;  Surgeon: [name], MD;  Location: [facility];  Service: Orthopedics;  Laterality: Left; . TOTAL KNEE ARTHROPLASTY Right 10/01/2015  Procedure: RIGHT TOTAL KNEE ARTHROPLASTY;  Surgeon: [name], MD;  Location: [facility];  Service: Orthopedics;  Laterality: Right; HPI: 55 year old female with a history of COPD, meningioma, cardiac arrest December 2020, hepatic steatosis, PNES, cognitive impairment, depression, complex partial seizure presenting with generalized weakness and a fall.  This has been a recurrent problem for the patient.  She was admitted on 2/7 to 02/24/20 with a similar presentation.  At that time she was treated for acute metabolic encephalopathy which was thought to be multifactorial including elevated ammonia, dehydration and dopamine agonist.  BSE requested  No data recorded Assessment / Plan / Recommendation CHL IP CLINICAL IMPRESSIONS 05/25/2020 Clinical  Impression Pt presents with moderate oropharyngeal dysphagia characterized by silent aspiration of thin liquids; amount of aspiration is dependent on presentation type and bolus size. Pt demonstrates premature spillage, decreased epiglottic  deflection, decreased laryngeal vestibule closure and decreased pharyngeal squeeze. Tsp sips of thin liquids result in trace flash penetration, cup sips result in penetration with trace amounts falling to the cords that are not always visualized being cleared from the laryngeal vestibule; straw sips of thin result in moderate amounts falling to the cords and eventually being aspirated. Pt also demonstrated a moderate amount of aspiration of a cup sip of thin with the barium tablet administration (this unfortunately occurred prior to floro being turned on, however evidence of aspiration present on the tracheal wall); All aspiration was silent and required verbal cues for Pt to cough. Further, cough was inconsitently effective in clearing aspirates. With NTL Pt demonstrated occasional flash penetration and puree and regular textures were consumed without incident. Recommend continue with a D2/fine chop diet and NTL. Pt is cognitively unable to consistently utilize strategies. SLP will continue to follow acutely SLP Visit Diagnosis Dysphagia, unspecified (R13.10) Attention and concentration deficit following -- Frontal lobe and executive function deficit following -- Impact on safety and function Mild aspiration risk;Moderate aspiration risk   CHL IP TREATMENT RECOMMENDATION 05/25/2020 Treatment Recommendations F/U MBS in --- days (Comment);Therapy as outlined in treatment plan below   Prognosis 05/25/2020 Prognosis for Safe Diet Advancement Good Barriers to Reach Goals Cognitive deficits Barriers/Prognosis Comment -- CHL IP DIET RECOMMENDATION 05/25/2020 SLP Diet Recommendations Dysphagia 2 (Fine chop) solids;Nectar thick liquid Liquid Administration via Cup;Straw Medication Administration Whole meds with puree Compensations Minimize environmental distractions;Slow rate;Small sips/bites Postural Changes Seated upright at 90 degrees   CHL IP OTHER RECOMMENDATIONS 05/25/2020 Recommended Consults -- Oral Care Recommendations  Oral care BID Other Recommendations Order thickener from pharmacy   CHL IP FOLLOW UP RECOMMENDATIONS 05/25/2020 Follow up Recommendations 24 hour supervision/assistance   CHL IP FREQUENCY AND DURATION 05/25/2020 Speech Therapy Frequency (ACUTE ONLY) min 1 x/week Treatment Duration 1 week      CHL IP ORAL PHASE 05/25/2020 Oral Phase Impaired Oral - Pudding Teaspoon -- Oral - Pudding Cup -- Oral - Honey Teaspoon -- Oral - Honey Cup -- Oral - Nectar Teaspoon Premature spillage;Decreased bolus cohesion;Piecemeal swallowing Oral - Nectar Cup Premature spillage;Decreased bolus cohesion;Piecemeal swallowing Oral - Nectar Straw NT Oral - Thin Teaspoon Decreased bolus cohesion;Premature spillage;Piecemeal swallowing Oral - Thin Cup Premature spillage;Decreased bolus cohesion;Piecemeal swallowing Oral - Thin Straw Premature spillage;Decreased bolus cohesion;Piecemeal swallowing Oral - Puree WFL Oral - Mech Soft -- Oral - Regular Piecemeal swallowing Oral - Multi-Consistency -- Oral - Pill (No Data) Oral Phase - Comment --  CHL IP PHARYNGEAL PHASE 05/25/2020 Pharyngeal Phase Impaired Pharyngeal- Pudding Teaspoon -- Pharyngeal -- Pharyngeal- Pudding Cup -- Pharyngeal -- Pharyngeal- Honey Teaspoon -- Pharyngeal -- Pharyngeal- Honey Cup -- Pharyngeal -- Pharyngeal- Nectar Teaspoon NT Pharyngeal -- Pharyngeal- Nectar Cup Pharyngeal residue - valleculae;Pharyngeal residue - pyriform;Penetration/Aspiration during swallow Pharyngeal Material enters airway, remains ABOVE vocal cords then ejected out Pharyngeal- Nectar Straw NT Pharyngeal -- Pharyngeal- Thin Teaspoon Pharyngeal residue - valleculae;Pharyngeal residue - pyriform;Penetration/Aspiration during swallow;Penetration/Aspiration before swallow;Trace aspiration;Reduced epiglottic inversion;Delayed swallow initiation-pyriform sinuses;Reduced pharyngeal peristalsis;Reduced airway/laryngeal closure Pharyngeal Material enters airway, passes BELOW cords without attempt by patient to  eject out (silent aspiration) Pharyngeal- Thin Cup Pharyngeal residue - valleculae;Pharyngeal residue - pyriform;Penetration/Aspiration during swallow;Penetration/Aspiration before swallow;Trace aspiration;Reduced epiglottic inversion;Delayed swallow initiation-pyriform sinuses;Reduced pharyngeal peristalsis;Reduced airway/laryngeal closure Pharyngeal Material enters airway, passes  BELOW cords without attempt by patient to eject out (silent aspiration) Pharyngeal- Thin Straw Pharyngeal residue - valleculae;Pharyngeal residue - pyriform;Penetration/Aspiration during swallow;Penetration/Aspiration before swallow;Reduced epiglottic inversion;Delayed swallow initiation-pyriform sinuses;Reduced pharyngeal peristalsis;Reduced airway/laryngeal closure;Moderate aspiration Pharyngeal Material enters airway, passes BELOW cords without attempt by patient to eject out (silent aspiration) Pharyngeal- Puree Pharyngeal residue - valleculae Pharyngeal -- Pharyngeal- Mechanical Soft NT Pharyngeal -- Pharyngeal- Regular Pharyngeal residue - valleculae Pharyngeal -- Pharyngeal- Multi-consistency -- Pharyngeal -- Pharyngeal- Pill -- Pharyngeal -- Pharyngeal Comment --  CHL IP CERVICAL ESOPHAGEAL PHASE 05/25/2020 Cervical Esophageal Phase WFL Pudding Teaspoon -- Pudding Cup -- Honey Teaspoon -- Honey Cup -- Nectar Teaspoon -- Nectar Cup -- Nectar Straw -- Thin Teaspoon -- Thin Cup -- Thin Straw -- Puree -- Mechanical Soft -- Regular -- Multi-consistency -- Pill -- Cervical Esophageal Comment -- Amelia H. Roddie Mc, CCC-SLP Speech Language Pathologist Wende Bushy 05/25/2020, 4:05 PM              US ABDOMEN COMPLETE W/ELASTOGRAPHY  Result Date: 05/03/2020 CLINICAL DATA:  Fatty liver EXAM: ULTRASOUND ABDOMEN ULTRASOUND HEPATIC ELASTOGRAPHY TECHNIQUE: Sonography of the upper abdomen was performed. In addition, ultrasound elastography evaluation of the liver was performed. A region of interest was placed within the right lobe of  the liver. Following application of a compressive sonographic pulse, tissue compressibility was assessed. Multiple assessments were performed at the selected site. Median tissue compressibility was determined. Previously, hepatic stiffness was assessed by shear wave velocity. Based on recently published Society of Radiologists in Ultrasound consensus article, reporting is now recommended to be performed in the SI units of pressure (kiloPascals) representing hepatic stiffness/elasticity. The obtained result is compared to the published reference standards. (cACLD = compensated Advanced Chronic Liver Disease) COMPARISON:  02/23/2020 FINDINGS: ULTRASOUND ABDOMEN Gallbladder: Gallbladder partially contracted without evidence of gallstones or sonographic Murphy sign. Small amount of internal sludge is present. No wall thickening. Common bile duct: Diameter: 6 mm upper normal Liver: Echogenic hepatic parenchyma with nodular contour consistent with cirrhosis. No discrete hepatic mass. Portal vein is patent on color Doppler imaging with normal direction of blood flow towards the liver. IVC: Normal appearance Pancreas: Normal appearance Spleen: 11.9 cm length, calculated volume 399 mL.  No focal mass. Right Kidney: Length: 11.9 cm. Normal morphology without mass or hydronephrosis. Left Kidney: Length: 12.0 cm. Normal morphology without mass or hydronephrosis. Abdominal aorta: Normal caliber Other findings: Scattered ascites. ULTRASOUND HEPATIC ELASTOGRAPHY Device: Siemens Helix VTQ Patient position: Not recorded Transducer 5C1 Number of measurements: 10 Hepatic segment:  8 Median kPa: 95.8 IQR: 11.6 IQR/Median kPa ratio: 0.1 Data quality:  Good Diagnostic category: > or =17 kPa: highly suggestive of cACLD with an increased probability of clinically significant portal hypertension The use of hepatic elastography is applicable to patients with viral hepatitis and non-alcoholic fatty liver disease. At this time, there is  insufficient data for the referenced cut-off values and use in other causes of liver disease, including alcoholic liver disease. Patients, however, may be assessed by elastography and serve as their own reference standard/baseline. In patients with non-alcoholic liver disease, the values suggesting compensated advanced chronic liver disease (cACLD) may be lower, and patients may need additional testing with elasticity results of 7-9 kPa. Please note that abnormal hepatic elasticity and shear wave velocities may also be identified in clinical settings other than with hepatic fibrosis, such as: acute hepatitis, elevated right heart and central venous pressures including use of beta blockers, veno-occlusive disease (Budd-Chiari), infiltrative processes such as mastocytosis/amyloidosis/infiltrative tumor/lymphoma, extrahepatic  cholestasis, with hyperemia in the post-prandial state, and with liver transplantation. Correlation with patient history, laboratory data, and clinical condition recommended. Diagnostic Categories: < or =5 kPa: high probability of being normal < or =9 kPa: in the absence of other known clinical signs, rules out cACLD >9 kPa and ?13 kPa: suggestive of cACLD, but needs further testing >13 kPa: highly suggestive of cACLD > or =17 kPa: highly suggestive of cACLD with an increased probability of clinically significant portal hypertension IMPRESSION: ULTRASOUND ABDOMEN: Cirrhotic appearing liver without focal mass. ULTRASOUND HEPATIC ELASTOGRAPHY: Median kPa:  95.8 Diagnostic category: > or =17 kPa: highly suggestive of cACLD with an increased probability of clinically significant portal hypertension Electronically Signed   By: Lavonia Dana M.D.   On: 05/03/2020 17:09   ECHOCARDIOGRAM COMPLETE  Result Date: 05/15/2020    ECHOCARDIOGRAM REPORT   Patient Name:   BRILEE PORT Date of Exam: 05/15/2020 Medical Rec #:  [id number]       Height:       60.0 in Accession #:    [id number]      Weight:        127.9 lb Date of Birth:  [birth year]        BSA:          1.543 m Patient Age:    54 years        BP:           88/64 mmHg Patient Gender: F               HR:           95 bpm. Exam Location:  [facility] Procedure: 2D Echo, Cardiac Doppler and Color Doppler Indications:    Bacteremia R78.81  History:        Patient has prior history of Echocardiogram examinations, most                 recent 02/23/2020. Risk Factors:Dyslipidemia. Cardiac Arrest,                 Elevated Liver Function Test.  Sonographer:    Alvino Chapel RCS Referring Phys: [id number] [place]  1. Left ventricular ejection fraction, by estimation, is 60 to 65%. The left ventricle has normal function. The left ventricle has no regional wall motion abnormalities. Left ventricular diastolic parameters are indeterminate.  2. Right ventricular systolic function is normal. The right ventricular size is normal.  3. Left atrial size was moderately dilated.  4. The mitral valve is normal in structure. No evidence of mitral valve regurgitation. No evidence of mitral stenosis.  5. The aortic valve has an indeterminant number of cusps. Aortic valve regurgitation is not visualized. No aortic stenosis is present.  6. The inferior vena cava is normal in size with greater than 50% respiratory variability, suggesting right atrial pressure of 3 mmHg. FINDINGS  Left Ventricle: Left ventricular ejection fraction, by estimation, is 60 to 65%. The left ventricle has normal function. The left ventricle has no regional wall motion abnormalities. The left ventricular internal cavity size was normal in size. There is  no left ventricular hypertrophy. Left ventricular diastolic parameters are indeterminate. Right Ventricle: The right ventricular size is normal. No increase in right ventricular wall thickness. Right ventricular systolic function is normal. Left Atrium: Left atrial size was moderately dilated. Right Atrium: Right atrial size was not well  visualized. Pericardium: There is no evidence of pericardial effusion. Mitral Valve: The mitral valve is normal in structure.  No evidence of mitral valve regurgitation. No evidence of mitral valve stenosis. Tricuspid Valve: The tricuspid valve is normal in structure. Tricuspid valve regurgitation is not demonstrated. No evidence of tricuspid stenosis. Aortic Valve: The aortic valve has an indeterminant number of cusps. Aortic valve regurgitation is not visualized. No aortic stenosis is present. Aortic valve mean gradient measures 5.1 mmHg. Aortic valve peak gradient measures 8.2 mmHg. Aortic valve area, by VTI measures 2.13 cm. Pulmonic Valve: The pulmonic valve was not well visualized. Pulmonic valve regurgitation is not visualized. No evidence of pulmonic stenosis. Aorta: The aortic root is normal in size and structure. Pulmonary Artery: Inadequate TR jet, indeterminant PASP. Venous: The inferior vena cava is normal in size with greater than 50% respiratory variability, suggesting right atrial pressure of 3 mmHg. IAS/Shunts: No atrial level shunt detected by color flow Doppler.  LEFT VENTRICLE PLAX 2D LVIDd:         4.80 cm  Diastology LVIDs:         3.10 cm  LV e' medial:    10.60 cm/s LV PW:         1.00 cm  LV E/e' medial:  11.3 LV IVS:        1.00 cm  LV e' lateral:   9.14 cm/s LVOT diam:     1.80 cm  LV E/e' lateral: 13.1 LV SV:         64 LV SV Index:   41 LVOT Area:     2.54 cm  RIGHT VENTRICLE RV S prime:     16.30 cm/s TAPSE (M-mode): 2.6 cm LEFT ATRIUM             Index       RIGHT ATRIUM           Index LA diam:        4.20 cm 2.72 cm/m  RA Area:     14.20 cm LA Vol (A2C):   53.8 ml 34.86 ml/m RA Volume:   36.10 ml  23.39 ml/m LA Vol (A4C):   64.2 ml 41.60 ml/m LA Biplane Vol: 62.5 ml 40.49 ml/m  AORTIC VALVE AV Area (Vmax):    2.40 cm AV Area (Vmean):   2.00 cm AV Area (VTI):     2.13 cm AV Vmax:           143.34 cm/s AV Vmean:          109.425 cm/s AV VTI:            0.301 m AV Peak Grad:       8.2 mmHg AV Mean Grad:      5.1 mmHg LVOT Vmax:         135.00 cm/s LVOT Vmean:        86.200 cm/s LVOT VTI:          0.251 m LVOT/AV VTI ratio: 0.84  AORTA Ao Root diam: 3.00 cm MITRAL VALVE MV Area (PHT): 3.97 cm     SHUNTS MV Decel Time: 191 msec     Systemic VTI:  0.25 m MV E velocity: 120.00 cm/s  Systemic Diam: 1.80 cm MV A velocity: 87.30 cm/s MV E/A ratio:  1.37 Carlyle Dolly MD Electronically signed by Carlyle Dolly MD Signature Date/Time: 05/15/2020/2:16:14 PM    Final    ECHO TEE  Result Date: 05/16/2020    TRANSESOPHOGEAL ECHO REPORT   Patient Name:   [name] Date of Exam: 05/16/2020 Medical Rec #:  [id number]  Height:       60.0 in Accession #:    [id number]      Weight:       127.9 lb Date of Birth:  1965-07-07        BSA:          1.543 m Patient Age:    58 years        BP:           123/85 mmHg Patient Gender: F               HR:           101 bpm. Exam Location:  [facility] Procedure: Transesophageal Echo, Cardiac Doppler and Color Doppler Indications:    Bacteremia  History:        Patient has prior history of Echocardiogram examinations, most                 recent 05/15/2020. Risk Factors:Dyslipidemia. Cardiac Arrest,                 Elevated Liver Function Test.  Sonographer:    Alvino Chapel RCS Referring Phys: [id number] [place]: The transesophogeal probe was passed without difficulty through the esophogus of the patient. Sedation performed by different physician. The patient developed no complications during the procedure. IMPRESSIONS  1. Left ventricular ejection fraction, by estimation, is 60 to 65%. The left ventricle has normal function.  2. Right ventricular systolic function is normal. The right ventricular size is normal.  3. Left atrial size was mildly dilated. No left atrial/left atrial appendage thrombus was detected. The LAA emptying velocity was 85 cm/s.  4. Right atrial size was mildly dilated.  5. The mitral valve is normal in structure. Trivial  mitral valve regurgitation. No evidence of mitral stenosis.  6. The aortic valve is tricuspid. Aortic valve regurgitation is not visualized. No aortic stenosis is present. Conclusion(s)/Recommendation(s): No evidence of vegetation/infective endocarditis on this transesophageal echocardiogram. FINDINGS  Left Ventricle: Left ventricular ejection fraction, by estimation, is 60 to 65%. The left ventricle has normal function. The left ventricular internal cavity size was normal in size. Right Ventricle: The right ventricular size is normal. No increase in right ventricular wall thickness. Right ventricular systolic function is normal. Left Atrium: Left atrial size was mildly dilated. No left atrial/left atrial appendage thrombus was detected. The LAA emptying velocity was 85 cm/s. Right Atrium: Right atrial size was mildly dilated. Pericardium: There is no evidence of pericardial effusion. Mitral Valve: The mitral valve is normal in structure. Trivial mitral valve regurgitation. No evidence of mitral valve stenosis. Tricuspid Valve: The tricuspid valve is normal in structure. Tricuspid valve regurgitation is trivial. No evidence of tricuspid stenosis. Aortic Valve: The aortic valve is tricuspid. Aortic valve regurgitation is not visualized. No aortic stenosis is present. Pulmonic Valve: The pulmonic valve was normal in structure. Pulmonic valve regurgitation is not visualized. No evidence of pulmonic stenosis. Aorta: The aortic root is normal in size and structure. IAS/Shunts: No atrial level shunt detected by color flow Doppler. Carlyle Dolly MD Electronically signed by Carlyle Dolly MD Signature Date/Time: 05/16/2020/1:28:24 PM    Final    [name] ASCITES (ABDOMEN LIMITED)  Result Date: 05/14/2020 CLINICAL DATA:  Ascites, question sufficient for paracentesis, clinical concern for spontaneous bacterial peritonitis EXAM: LIMITED ABDOMEN ULTRASOUND FOR ASCITES TECHNIQUE: Limited ultrasound survey for ascites was performed  in all four abdominal quadrants. COMPARISON:  CT abdomen and pelvis 05/13/2020 FINDINGS: Small volume ascites identified in  the lower quadrants bilaterally. Volume of ascites has probably slightly increased since prior CT. While the volume of fluid is insufficient for therapeutic paracentesis, a small volume of ascites may be obtainable for diagnostic purposes. Diagnostic paracentesis will be attempted. IMPRESSION: Small volume ascites, likely sufficient volume for diagnostic paracentesis. Electronically Signed   By: Lavonia Dana M.D.   On: 05/14/2020 11:11   [name] EKG SITE RITE  Result Date: 05/17/2020 If Site Rite image not attached, placement could not be confirmed due to current cardiac rhythm.   Microbiology: Recent Results (from the past 240 hour(s))  Culture, Urine     Status: None   Collection Time: 05/21/20  1:55 PM   Specimen: Urine, Random  Result Value Ref Range Status   Specimen Description   Final    URINE, RANDOM Performed at [facility], [address] [place], [place] [address]    Special Requests   Final    NONE Performed at [facility], [address], [place], [place] [address]    Culture   Final    NO GROWTH Performed at [facility], [place] [address], [place], [place] [address]    Report Status 05/23/2020 FINAL  Final  Culture, blood (Routine X 2) w Reflex to ID Panel     Status: None (Preliminary result)   Collection Time: 05/22/20  4:33 PM   Specimen: BLOOD RIGHT ARM  Result Value Ref Range Status   Specimen Description BLOOD RIGHT ARM  Final   Special Requests   Final    BOTTLES DRAWN AEROBIC AND ANAEROBIC Blood Culture results may not be optimal due to an excessive volume of blood received in culture bottles   Culture   Final    NO GROWTH 4 DAYS Performed at [facility], [address], [place], [place] [address]    Report Status PENDING  Incomplete  Culture, blood (Routine X 2) w Reflex to ID Panel     Status: None (Preliminary result)   Collection  Time: 05/22/20  4:33 PM   Specimen: BLOOD LEFT ARM  Result Value Ref Range Status   Specimen Description BLOOD LEFT ARM  Final   Special Requests   Final    BOTTLES DRAWN AEROBIC AND ANAEROBIC Blood Culture adequate volume   Culture   Final    NO GROWTH 4 DAYS Performed at [facility], [address] [place], [place] [address]    Report Status PENDING  Incomplete     Labs: Basic Metabolic Panel: Recent Labs  Lab 05/22/20 0444 05/23/20 0436 05/24/20 0108 05/25/20 0454 05/27/20 0447  NA 144 144 147* 142 138  K 5.1 3.2* 4.0 3.2* 3.4*  CL 109 111 115* 109 107  CO2 '23 24 22 23 ' 21*  GLUCOSE 89 123* 127* 140* 98  BUN 17 19 21* 23* 22*  CREATININE 0.80 0.75 0.82 0.73 0.64  CALCIUM 8.0* 7.8* 7.8* 7.5* 7.5*  MG 1.7  --  2.6*  --   --    Liver Function Tests: Recent Labs  Lab 05/22/20 0444 05/23/20 0436  AST 248* 310*  ALT 59* 68*  ALKPHOS 167* 141*  BILITOT 2.4* 2.3*  PROT 5.5* 4.9*  ALBUMIN 2.5* 2.3*    Recent Labs  Lab 05/22/20 1016 05/22/20 1257 05/24/20 0108  AMMONIA 64* 60* 49*   CBC: Recent Labs  Lab 05/23/20 0436 05/24/20 0108 05/25/20 0454 05/26/20 0405 05/27/20 0447  WBC 16.3* 18.6* 13.9* 12.3* 12.4*  HGB 7.8* 7.3* 6.8* 8.1* 8.6*  HCT  22.8* 21.8* 20.6* 24.4* 25.7*  MCV 87.4 90.5 90.7 91.0 91.8  PLT 238 249 262 228 234   BNP (last 3 results) Recent Labs    05/18/20 0504  BNP 92.0   CBG: Recent Labs  Lab 05/26/20 0135  GLUCAP 118*   Signed:  Barton Dubois MD.  [facility] 05/27/2020, 12:15 PM

## 2020-05-27 NOTE — Progress Notes (Signed)
Report called to [facility], EMS notified for transportation

## 2020-05-28 ENCOUNTER — Telehealth: Payer: Medicare HMO

## 2020-05-28 DIAGNOSIS — R569 Unspecified convulsions: Secondary | ICD-10-CM | POA: Diagnosis not present

## 2020-05-28 DIAGNOSIS — Z79899 Other long term (current) drug therapy: Secondary | ICD-10-CM | POA: Diagnosis not present

## 2020-05-28 DIAGNOSIS — R69 Illness, unspecified: Secondary | ICD-10-CM | POA: Diagnosis not present

## 2020-05-28 DIAGNOSIS — I10 Essential (primary) hypertension: Secondary | ICD-10-CM | POA: Diagnosis not present

## 2020-05-28 DIAGNOSIS — D649 Anemia, unspecified: Secondary | ICD-10-CM | POA: Diagnosis not present

## 2020-05-28 DIAGNOSIS — G894 Chronic pain syndrome: Secondary | ICD-10-CM | POA: Diagnosis not present

## 2020-05-29 ENCOUNTER — Emergency Department (HOSPITAL_COMMUNITY): Payer: Medicare HMO

## 2020-05-29 ENCOUNTER — Other Ambulatory Visit: Payer: Self-pay

## 2020-05-29 ENCOUNTER — Emergency Department (HOSPITAL_COMMUNITY)
Admission: EM | Admit: 2020-05-29 | Discharge: 2020-05-29 | Disposition: A | Discharge status: Home / Self Care | Payer: Medicare HMO | Attending: Emergency Medicine | Admitting: Emergency Medicine

## 2020-05-29 DIAGNOSIS — R7989 Other specified abnormal findings of blood chemistry: Secondary | ICD-10-CM

## 2020-05-29 DIAGNOSIS — Z85841 Personal history of malignant neoplasm of brain: Secondary | ICD-10-CM | POA: Insufficient documentation

## 2020-05-29 DIAGNOSIS — Z79899 Other long term (current) drug therapy: Secondary | ICD-10-CM | POA: Diagnosis not present

## 2020-05-29 DIAGNOSIS — Z96642 Presence of left artificial hip joint: Secondary | ICD-10-CM | POA: Diagnosis not present

## 2020-05-29 DIAGNOSIS — D689 Coagulation defect, unspecified: Secondary | ICD-10-CM | POA: Insufficient documentation

## 2020-05-29 DIAGNOSIS — R7401 Elevation of levels of liver transaminase levels: Secondary | ICD-10-CM | POA: Diagnosis not present

## 2020-05-29 DIAGNOSIS — S0990XA Unspecified injury of head, initial encounter: Secondary | ICD-10-CM | POA: Diagnosis not present

## 2020-05-29 DIAGNOSIS — Z91040 Latex allergy status: Secondary | ICD-10-CM | POA: Insufficient documentation

## 2020-05-29 DIAGNOSIS — Y92129 Unspecified place in nursing home as the place of occurrence of the external cause: Secondary | ICD-10-CM | POA: Insufficient documentation

## 2020-05-29 DIAGNOSIS — Z96651 Presence of right artificial knee joint: Secondary | ICD-10-CM | POA: Insufficient documentation

## 2020-05-29 DIAGNOSIS — D649 Anemia, unspecified: Secondary | ICD-10-CM | POA: Insufficient documentation

## 2020-05-29 DIAGNOSIS — R4182 Altered mental status, unspecified: Secondary | ICD-10-CM | POA: Diagnosis not present

## 2020-05-29 DIAGNOSIS — M47812 Spondylosis without myelopathy or radiculopathy, cervical region: Secondary | ICD-10-CM | POA: Diagnosis not present

## 2020-05-29 DIAGNOSIS — R945 Abnormal results of liver function studies: Secondary | ICD-10-CM | POA: Diagnosis not present

## 2020-05-29 DIAGNOSIS — I10 Essential (primary) hypertension: Secondary | ICD-10-CM | POA: Diagnosis not present

## 2020-05-29 DIAGNOSIS — W19XXXA Unspecified fall, initial encounter: Secondary | ICD-10-CM | POA: Diagnosis not present

## 2020-05-29 DIAGNOSIS — Z043 Encounter for examination and observation following other accident: Secondary | ICD-10-CM | POA: Diagnosis not present

## 2020-05-29 LAB — COMPREHENSIVE METABOLIC PANEL
ALT: 35 U/L (ref 0–44)
AST: 141 U/L — ABNORMAL HIGH (ref 15–41)
Albumin: 2.1 g/dL — ABNORMAL LOW (ref 3.5–5.0)
Alkaline Phosphatase: 162 U/L — ABNORMAL HIGH (ref 38–126)
Anion gap: 8 (ref 5–15)
BUN: 24 mg/dL — ABNORMAL HIGH (ref 6–20)
CO2: 26 mmol/L (ref 22–32)
Calcium: 7.9 mg/dL — ABNORMAL LOW (ref 8.9–10.3)
Chloride: 109 mmol/L (ref 98–111)
Creatinine, Ser: 0.77 mg/dL (ref 0.44–1.00)
GFR, Estimated: >60 mL/min (ref >60)
Glucose, Bld: 121 mg/dL — ABNORMAL HIGH (ref 70–99)
Potassium: 3.6 mmol/L (ref 3.5–5.1)
Sodium: 143 mmol/L (ref 135–145)
Total Bilirubin: 3.1 mg/dL — ABNORMAL HIGH (ref 0.3–1.2)
Total Protein: 5.1 g/dL — ABNORMAL LOW (ref 6.5–8.1)

## 2020-05-29 LAB — CBC WITH DIFFERENTIAL/PLATELET
Abs Immature Granulocytes: 0.14 10*3/uL — ABNORMAL HIGH (ref 0.00–0.07)
Basophils Absolute: 0 10*3/uL (ref 0.0–0.1)
Basophils Relative: 0 %
Eosinophils Absolute: 0.2 10*3/uL (ref 0.0–0.5)
Eosinophils Relative: 1 %
HCT: 26.2 % — ABNORMAL LOW (ref 36.0–46.0)
Hemoglobin: 8.5 g/dL — ABNORMAL LOW (ref 12.0–15.0)
Immature Granulocytes: 1 %
Lymphocytes Relative: 14 %
Lymphs Abs: 1.7 10*3/uL (ref 0.7–4.0)
MCH: 30.7 pg (ref 26.0–34.0)
MCHC: 32.4 g/dL (ref 30.0–36.0)
MCV: 94.6 fL (ref 80.0–100.0)
Monocytes Absolute: 1.1 10*3/uL — ABNORMAL HIGH (ref 0.1–1.0)
Monocytes Relative: 9 %
Neutro Abs: 9.1 10*3/uL — ABNORMAL HIGH (ref 1.7–7.7)
Neutrophils Relative %: 75 %
Platelets: 250 10*3/uL (ref 150–400)
RBC: 2.77 MIL/uL — ABNORMAL LOW (ref 3.87–5.11)
RDW: 21.1 % — ABNORMAL HIGH (ref 11.5–15.5)
WBC: 12.2 10*3/uL — ABNORMAL HIGH (ref 4.0–10.5)
nRBC: 0 % (ref 0.0–0.2)

## 2020-05-29 LAB — AMMONIA: Ammonia: 29 umol/L (ref 9–35)

## 2020-05-29 LAB — PROTIME-INR
INR: 2.1 — ABNORMAL HIGH (ref 0.8–1.2)
Prothrombin Time: 23.3 seconds — ABNORMAL HIGH (ref 11.4–15.2)

## 2020-05-29 MED ORDER — LORAZEPAM 1 MG PO TABS
1.0000 mg | ORAL_TABLET | Freq: Once | ORAL | Status: DC
  Filled 2020-05-29: qty 1

## 2020-05-29 NOTE — ED Triage Notes (Addendum)
rcems from [place]. Pt went to [facility] from [facility] on 05/27/2020. Today, 05/29/20, was found on the floor. Per ems pt is receiving abx for mrsa via picc in right arm. Pt also has a foley catheter. Pt is alert and orietned to self only. Pt is combative and perverse. Unable to redirect conversations. Pt has a healing bruise to back of right thigh.

## 2020-05-29 NOTE — ED Provider Notes (Signed)
[facility] EMERGENCY DEPARTMENT Provider Note   CSN: [id number] Arrival date & time: 05/29/20  0259     History Chief Complaint  Patient presents with  . Fall    [name] is a 55 y.o. female.  The history is provided by the nursing home and the patient. The history is limited by the condition of the patient (Altered mental status).  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  Patient is denying a fall.  She is at the nursing facility to get antibiotics for MRSA infection.   Past Medical History:  Diagnosis Date  . Arthritis    "knees" (04/22/2012)  . Brain tumor (benign) ([place])   . Chronic bronchitis ([place])    "yearly; when the weather changes" (04/22/2012)  . Colon polyp   . Colon polyps    adenomatous and hyperplastic-  . Depression   . Eczema   . Epilepsy ([place])    "been having them right often here lately" (04/22/2012)  . Fatty liver   . High cholesterol   . History of kidney stones   . Osteoarthritis    [name] 04/22/2012  . Other convulsions 05/21/12   non-epileptic spells  . Restless leg   . Seizures ([place])   . Vertigo     Patient Active Problem List   Diagnosis Date Noted  . Pressure injury of skin 05/26/2020  . Acute respiratory failure with hypoxia ([place]) 05/16/2020  . Severe sepsis with acute organ dysfunction due to methicillin susceptible Staphylococcus aureus (MSSA) ([place]) 05/16/2020  . Ascites   . Fever in adult 05/13/2020  . SIRS (systemic inflammatory response syndrome) ([place]) 05/13/2020  . Rib injury 05/13/2020  . Other cirrhosis of liver ([place]) 05/13/2020  . 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 Fever 05/13/2020  . Memory loss   . Transaminasemia   . Visual hallucination   . Generalized weakness 02/21/2020  . Fall at home, initial encounter 02/21/2020  . Hepatic encephalopathy ([place]) 02/21/2020  . Acute metabolic  encephalopathy 89/37/3428  . Hyperammonemia ([place]) 02/13/2020  . Meningioma ([place]) 09/12/2019  . Benign neoplasm of cerebral meninges ([place]) 09/09/2019  . Anxiety 12/30/2018  . 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 Widowed - July 2019 08/07/2017  . Sleep apnea 06/09/2017  . Colon polyp 06/05/2017  . Elevated liver function tests 06/05/2017  . Fatty liver   . Mild neurocognitive disorder 02/04/2017  . Seizure ([place]) 11/14/2016  . CAP (community acquired pneumonia) 11/09/2016  . 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 Lobar pneumonia ([place]) 11/28/2015  . Partial symptomatic epilepsy with complex partial seizures, not intractable, with status epilepticus ([name]) 07/24/2015  . OA (osteoarthritis) of knee 05/07/2015  . 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 Seizure disorder ([place]) 07/17/2011    Past Surgical History:  Procedure Laterality Date  . ABDOMINAL HYSTERECTOMY  2001  . BLADDER SUSPENSION    . BUNIONECTOMY Left 2000  . CESAREAN SECTION  1987; 1988  . COLONOSCOPY    . JOINT REPLACEMENT    . MASS EXCISION  10/22/2011   Procedure: EXCISION MASS;  Surgeon: [name], MD;  Location: [facility];  Service: General;  Laterality: Right;  excision right buttock mass  . TEE WITHOUT CARDIOVERSION N/A 05/16/2020   Procedure: TRANSESOPHAGEAL ECHOCARDIOGRAM (TEE) WITH PROPOFOL;  Surgeon: Arnoldo Lenis, MD;  Location: [facility];  Service: Endoscopy;  Laterality: N/A;  .  TOTAL HIP ARTHROPLASTY Left 1993; 1995; 2000  . TOTAL KNEE ARTHROPLASTY Left 05/07/2015   Procedure: TOTAL LEFT KNEE ARTHROPLASTY;  Surgeon: [name], MD;  Location: [facility];  Service: Orthopedics;  Laterality: Left;  . TOTAL KNEE ARTHROPLASTY Right 10/01/2015   Procedure: RIGHT TOTAL KNEE ARTHROPLASTY;  Surgeon: [name], MD;  Location: [facility];  Service: Orthopedics;  Laterality: Right;     OB History   No obstetric history on file.     Family History  Problem Relation Age of Onset  .  Cancer Mother   . Depression Father   . Cancer Brother   . Diabetes Brother   . Cancer Maternal Aunt   . Diabetes Maternal Aunt   . Bipolar disorder Son   . Drug abuse Son   . Heart attack Sister 24       during severe illness    Social History   Tobacco Use  . Smoking status: Never Smoker  . Smokeless tobacco: Never Used  Vaping Use  . Vaping Use: Never used  Substance Use Topics  . Alcohol use: No    Alcohol/week: 0.0 standard drinks    Comment: 12-25-2015 per pt no  . Drug use: No    Comment: 21-12-2015 per pt no     Home Medications Prior to Admission medications   Medication Sig Start Date End Date Taking? Authorizing Provider  ceFAZolin (ANCEF) IVPB Inject 2 g into the vein every 8 (eight) hours for 24 days. Indication:  MSSA bacteremia  First Dose: Yes Last Day of Therapy:  Jun 10, 2020 Labs - Once weekly:  CBC/D and BMP, Labs - Every other week:  ESR and CRP Method of administration: IV Push Method of administration may be changed at the discretion of home infusion pharmacist based upon assessment of the patient and/or caregiver's ability to self-administer the medication ordered. 05/18/20 06/11/20  Orson Eva, MD  FLUoxetine (PROZAC) 40 MG capsule Take 2 capsules (80 mg total) by mouth daily. 04/26/20 07/25/20  Norman Clay, MD  folic acid (FOLVITE) 1 MG tablet Take 1 tablet (1 mg total) by mouth daily. 02/24/20   Orson Eva, MD  food thickener (SIMPLYTHICK, NECTAR/LEVEL 2/MILDLY THICK,) GEL Take 1 packet by mouth as needed (as needed for levetiracetam solution administration). 05/27/20   Barton Dubois, MD  furosemide (LASIX) 40 MG tablet Take 1 tablet (40 mg total) by mouth daily. 05/28/20   Barton Dubois, MD  lactulose (CHRONULAC) 10 GM/15ML solution Take 30 mLs (20 g total) by mouth 3 (three) times daily. 05/27/20   Barton Dubois, MD  lamoTRIgine (LAMICTAL) 150 MG tablet Take 1 tablet (150 mg total) by mouth 2 (two) times daily. For mood control Patient taking  differently: Take 150 mg by mouth 2 (two) times daily. 12/26/19   Ventura Sellers, MD  levETIRAcetam (KEPPRA) 100 MG/ML solution Take 10 mLs (1,000 mg total) by mouth 2 (two) times daily. 05/27/20   Barton Dubois, MD  LORazepam (ATIVAN) 1 MG tablet Take 1 tablet (1 mg total) by mouth daily as needed for anxiety. 03/26/20   Norman Clay, MD  metoprolol tartrate (LOPRESSOR) 25 MG tablet Take 0.5 tablets (12.5 mg total) by mouth 2 (two) times daily. 05/21/20   Orson Eva, MD  senna-docusate (SENOKOT-S) 8.6-50 MG tablet Take 1 tablet by mouth 2 (two) times daily. 05/27/20   Barton Dubois, MD    Allergies    Acetaminophen, Benadryl [diphenhydramine hcl], Codeine, Dilantin [phenytoin sodium extended], Melatonin, Tramadol, Ultram [tramadol hcl], Vimpat [lacosamide],  Mirtazapine, Betadine [povidone iodine], Latex, Penicillins, Sulfa antibiotics, Tape, and Vicodin [hydrocodone-acetaminophen]  Review of Systems   Review of Systems  Unable to perform ROS: Mental status change    Physical Exam Updated Vital Signs BP 113/70   Pulse 84   Temp 98 F (36.7 C)   Resp 17   Ht 5' (1.524 m)   Wt 62.8 kg   SpO2 97%   BMI 27.04 kg/m   Physical Exam Vitals and nursing note reviewed.   55 year old female, resting comfortably and in no acute distress. Vital signs are normal. Oxygen saturation is 97%, which is normal. Head is normocephalic and atraumatic. PERRLA, EOMI. Oropharynx is clear. Neck is nontender without adenopathy or JVD. Back is nontender and there is no CVA tenderness. Lungs are clear without rales, wheezes, or rhonchi. Chest is nontender. Heart has regular rate and rhythm without murmur. Abdomen is soft, flat, nontender without masses or hepatosplenomegaly and peristalsis is normoactive. Extremities have no cyanosis or edema, full range of motion is present. Skin is warm and dry without rash. Neurologic: Awake and oriented to person and place and mainly oriented to time, rather bizarre  affect, cranial nerves are intact, there are no motor or sensory deficits.  ED Results / Procedures / Treatments   Labs (all labs ordered are listed, but only abnormal results are displayed) Labs Reviewed  COMPREHENSIVE METABOLIC PANEL - Abnormal; Notable for the following components:      Result Value   Glucose, Bld 121 (*)    BUN 24 (*)    Calcium 7.9 (*)    Total Protein 5.1 (*)    Albumin 2.1 (*)    AST 141 (*)    Alkaline Phosphatase 162 (*)    Total Bilirubin 3.1 (*)    All other components within normal limits  CBC WITH DIFFERENTIAL/PLATELET - Abnormal; Notable for the following components:   WBC 12.2 (*)    RBC 2.77 (*)    Hemoglobin 8.5 (*)    HCT 26.2 (*)    RDW 21.1 (*)    Neutro Abs 9.1 (*)    Monocytes Absolute 1.1 (*)    Abs Immature Granulocytes 0.14 (*)    All other components within normal limits  PROTIME-INR - Abnormal; Notable for the following components:   Prothrombin Time 23.3 (*)    INR 2.1 (*)    All other components within normal limits  AMMONIA    EKG EKG Interpretation  Date/Time:  Tuesday May 29 2020 03:08:25 EDT Ventricular Rate:  85 PR Interval:  136 QRS Duration: 102 QT Interval:  438 QTC Calculation: 521 R Axis:   74 Text Interpretation: Sinus rhythm Probable left atrial enlargement Anteroseptal infarct, age indeterminate Prolonged QT interval When compared with ECG of 05/12/2020, QT has lengthened Confirmed by Delora Fuel ([id number]) on 05/29/2020 3:10:59 AM   Radiology CT Head Wo Contrast  Result Date: 05/29/2020 CLINICAL DATA:  Head injury, Unwitnessed fall, altered mental status EXAM: CT HEAD WITHOUT CONTRAST CT CERVICAL SPINE WITHOUT CONTRAST TECHNIQUE: Multidetector CT imaging of the head and cervical spine was performed following the standard protocol without intravenous contrast. Multiplanar CT image reconstructions of the cervical spine were also generated. COMPARISON:  None. FINDINGS: CT HEAD FINDINGS Brain: Normal anatomic  configuration. No abnormal intra or extra-axial mass lesion or fluid collection. No abnormal mass effect or midline shift. No evidence of acute intracranial hemorrhage or infarct. Ventricular size is normal. Cerebellum unremarkable. Vascular: Unremarkable Skull: Intact Sinuses/Orbits: Paranasal sinuses are clear.  Orbits are unremarkable. Other: There is hypoplasia of the mastoid air cells bilaterally. Middle ear cavities are clear. CT CERVICAL SPINE FINDINGS Alignment: Accentuated cervical lordosis is likely positional in nature. No listhesis. Skull base and vertebrae: The craniocervical junction is unremarkable. The atlantodental interval is normal. No acute fracture of the cervical spine. Soft tissues and spinal canal: No prevertebral fluid or swelling. No visible canal hematoma. Disc levels: There is intervertebral disc space narrowing and endplate remodeling of E4-M3, most severe at C6-7 in keeping with changes of mild to moderate degenerative disc disease. There are posterior disc herniations noted at C2-3 and C3-4 which mildly effaces the anterior canal space. Eccentric posterior disc osteophyte complex, asymmetrically more severe on the left, results in abutment of the thecal sac without remodeling. Multilevel uncovertebral and facet arthrosis results in multilevel neuroforaminal narrowing, most severe bilaterally at C6-7. Upper chest: Mild biapical scarring is present. Other: Right upper extremity PICC line is partially visualized. IMPRESSION: No acute intracranial abnormality.  No calvarial fracture. No acute fracture or listhesis of the cervical spine. Multilevel degenerative disc and degenerative joint disease, most severe at C6-7 Electronically Signed   By: Fidela Salisbury MD   On: 05/29/2020 05:02   CT Cervical Spine Wo Contrast  Result Date: 05/29/2020 CLINICAL DATA:  Head injury, Unwitnessed fall, altered mental status EXAM: CT HEAD WITHOUT CONTRAST CT CERVICAL SPINE WITHOUT CONTRAST TECHNIQUE:  Multidetector CT imaging of the head and cervical spine was performed following the standard protocol without intravenous contrast. Multiplanar CT image reconstructions of the cervical spine were also generated. COMPARISON:  None. FINDINGS: CT HEAD FINDINGS Brain: Normal anatomic configuration. No abnormal intra or extra-axial mass lesion or fluid collection. No abnormal mass effect or midline shift. No evidence of acute intracranial hemorrhage or infarct. Ventricular size is normal. Cerebellum unremarkable. Vascular: Unremarkable Skull: Intact Sinuses/Orbits: Paranasal sinuses are clear. Orbits are unremarkable. Other: There is hypoplasia of the mastoid air cells bilaterally. Middle ear cavities are clear. CT CERVICAL SPINE FINDINGS Alignment: Accentuated cervical lordosis is likely positional in nature. No listhesis. Skull base and vertebrae: The craniocervical junction is unremarkable. The atlantodental interval is normal. No acute fracture of the cervical spine. Soft tissues and spinal canal: No prevertebral fluid or swelling. No visible canal hematoma. Disc levels: There is intervertebral disc space narrowing and endplate remodeling of N3-I1, most severe at C6-7 in keeping with changes of mild to moderate degenerative disc disease. There are posterior disc herniations noted at C2-3 and C3-4 which mildly effaces the anterior canal space. Eccentric posterior disc osteophyte complex, asymmetrically more severe on the left, results in abutment of the thecal sac without remodeling. Multilevel uncovertebral and facet arthrosis results in multilevel neuroforaminal narrowing, most severe bilaterally at C6-7. Upper chest: Mild biapical scarring is present. Other: Right upper extremity PICC line is partially visualized. IMPRESSION: No acute intracranial abnormality.  No calvarial fracture. No acute fracture or listhesis of the cervical spine. Multilevel degenerative disc and degenerative joint disease, most severe at  C6-7 Electronically Signed   By: Fidela Salisbury MD   On: 05/29/2020 05:02    Procedures Procedures she did have episodes of hepatic encephalopathy recently and has had an elevated INR.  Will check INR and hepatic function as well as ammonia, sent for CT of head and cervical spine.  Medications Ordered in ED Medications  LORazepam (ATIVAN) tablet 1 mg (has no administration in time range)    ED Course  I have reviewed the triage vital signs and the nursing  notes.  Pertinent labs & imaging results that were available during my care of the patient were reviewed by me and considered in my medical decision making (see chart for details).   MDM Rules/Calculators/A&P                         Probable fall.  Patient with rather unusual affect but apparently is at her baseline.  Old records reviewed, and she has had episodes of hepatic encephalopathy recently, has elevated INR at baseline.  Will check hepatic function and ammonia level, sent for CT of head and cervical spine.  CT scan showed no acute injury.  Labs show normal ammonia, stable anemia, generally improving AST and alkaline phosphatase although bilirubin has increased compared with most recent value.  IV antibiotics for MRSA.  Final Clinical Impression(s) / ED Diagnoses Final diagnoses:  Fall at nursing home, initial encounter  Coagulopathy ([place])  Elevated liver function tests  Normochromic normocytic anemia    Rx / DC Orders ED Discharge Orders    None       Delora Fuel, MD 84/12/82 0602

## 2020-05-29 NOTE — Discharge Instructions (Signed)
Continue your usual care at the skilled nursing facility.

## 2020-05-29 NOTE — ED Notes (Signed)
Pt asleep at this time

## 2020-05-30 DIAGNOSIS — Z792 Long term (current) use of antibiotics: Secondary | ICD-10-CM | POA: Diagnosis not present

## 2020-05-31 DIAGNOSIS — W19XXXD Unspecified fall, subsequent encounter: Secondary | ICD-10-CM | POA: Diagnosis not present

## 2020-05-31 DIAGNOSIS — G40219 Localization-related (focal) (partial) symptomatic epilepsy and epileptic syndromes with complex partial seizures, intractable, without status epilepticus: Secondary | ICD-10-CM | POA: Diagnosis not present

## 2020-05-31 DIAGNOSIS — R4189 Other symptoms and signs involving cognitive functions and awareness: Secondary | ICD-10-CM | POA: Diagnosis not present

## 2020-05-31 DIAGNOSIS — D329 Benign neoplasm of meninges, unspecified: Secondary | ICD-10-CM | POA: Diagnosis not present

## 2020-05-31 DIAGNOSIS — J449 Chronic obstructive pulmonary disease, unspecified: Secondary | ICD-10-CM | POA: Diagnosis not present

## 2020-05-31 DIAGNOSIS — K760 Fatty (change of) liver, not elsewhere classified: Secondary | ICD-10-CM | POA: Diagnosis not present

## 2020-06-03 ENCOUNTER — Other Ambulatory Visit: Payer: Self-pay

## 2020-06-03 ENCOUNTER — Encounter (HOSPITAL_COMMUNITY): Payer: Self-pay | Admitting: Emergency Medicine

## 2020-06-03 ENCOUNTER — Inpatient Hospital Stay (HOSPITAL_COMMUNITY)
Admission: EM | Admit: 2020-06-03 | Discharge: 2020-06-13 | DRG: 441 | Disposition: E | Payer: Medicare HMO | Attending: Internal Medicine | Admitting: Internal Medicine

## 2020-06-03 ENCOUNTER — Emergency Department (HOSPITAL_COMMUNITY): Payer: Medicare HMO

## 2020-06-03 DIAGNOSIS — E860 Dehydration: Secondary | ICD-10-CM | POA: Diagnosis not present

## 2020-06-03 DIAGNOSIS — D638 Anemia in other chronic diseases classified elsewhere: Secondary | ICD-10-CM | POA: Diagnosis present

## 2020-06-03 DIAGNOSIS — Z7189 Other specified counseling: Secondary | ICD-10-CM | POA: Diagnosis not present

## 2020-06-03 DIAGNOSIS — K922 Gastrointestinal hemorrhage, unspecified: Secondary | ICD-10-CM | POA: Diagnosis not present

## 2020-06-03 DIAGNOSIS — R519 Headache, unspecified: Secondary | ICD-10-CM | POA: Diagnosis not present

## 2020-06-03 DIAGNOSIS — G8929 Other chronic pain: Secondary | ICD-10-CM | POA: Diagnosis present

## 2020-06-03 DIAGNOSIS — R0989 Other specified symptoms and signs involving the circulatory and respiratory systems: Secondary | ICD-10-CM

## 2020-06-03 DIAGNOSIS — W1830XA Fall on same level, unspecified, initial encounter: Secondary | ICD-10-CM | POA: Diagnosis present

## 2020-06-03 DIAGNOSIS — G40209 Localization-related (focal) (partial) symptomatic epilepsy and epileptic syndromes with complex partial seizures, not intractable, without status epilepticus: Secondary | ICD-10-CM | POA: Diagnosis present

## 2020-06-03 DIAGNOSIS — J449 Chronic obstructive pulmonary disease, unspecified: Secondary | ICD-10-CM | POA: Diagnosis present

## 2020-06-03 DIAGNOSIS — E870 Hyperosmolality and hypernatremia: Secondary | ICD-10-CM | POA: Diagnosis not present

## 2020-06-03 DIAGNOSIS — K209 Esophagitis, unspecified without bleeding: Secondary | ICD-10-CM | POA: Diagnosis present

## 2020-06-03 DIAGNOSIS — Z818 Family history of other mental and behavioral disorders: Secondary | ICD-10-CM

## 2020-06-03 DIAGNOSIS — E872 Acidosis: Secondary | ICD-10-CM | POA: Diagnosis not present

## 2020-06-03 DIAGNOSIS — R188 Other ascites: Secondary | ICD-10-CM

## 2020-06-03 DIAGNOSIS — Z20822 Contact with and (suspected) exposure to covid-19: Secondary | ICD-10-CM | POA: Diagnosis present

## 2020-06-03 DIAGNOSIS — Z885 Allergy status to narcotic agent status: Secondary | ICD-10-CM

## 2020-06-03 DIAGNOSIS — Z8674 Personal history of sudden cardiac arrest: Secondary | ICD-10-CM

## 2020-06-03 DIAGNOSIS — F5101 Primary insomnia: Secondary | ICD-10-CM | POA: Diagnosis present

## 2020-06-03 DIAGNOSIS — Z79899 Other long term (current) drug therapy: Secondary | ICD-10-CM

## 2020-06-03 DIAGNOSIS — Z515 Encounter for palliative care: Secondary | ICD-10-CM | POA: Diagnosis not present

## 2020-06-03 DIAGNOSIS — E7220 Disorder of urea cycle metabolism, unspecified: Secondary | ICD-10-CM | POA: Diagnosis not present

## 2020-06-03 DIAGNOSIS — R296 Repeated falls: Secondary | ICD-10-CM | POA: Diagnosis present

## 2020-06-03 DIAGNOSIS — E785 Hyperlipidemia, unspecified: Secondary | ICD-10-CM | POA: Diagnosis present

## 2020-06-03 DIAGNOSIS — J9601 Acute respiratory failure with hypoxia: Secondary | ICD-10-CM | POA: Diagnosis not present

## 2020-06-03 DIAGNOSIS — J690 Pneumonitis due to inhalation of food and vomit: Secondary | ICD-10-CM | POA: Diagnosis not present

## 2020-06-03 DIAGNOSIS — R7881 Bacteremia: Secondary | ICD-10-CM | POA: Diagnosis present

## 2020-06-03 DIAGNOSIS — R4182 Altered mental status, unspecified: Secondary | ICD-10-CM | POA: Diagnosis not present

## 2020-06-03 DIAGNOSIS — R339 Retention of urine, unspecified: Secondary | ICD-10-CM | POA: Diagnosis present

## 2020-06-03 DIAGNOSIS — K2970 Gastritis, unspecified, without bleeding: Secondary | ICD-10-CM | POA: Diagnosis not present

## 2020-06-03 DIAGNOSIS — Z66 Do not resuscitate: Secondary | ICD-10-CM | POA: Diagnosis not present

## 2020-06-03 DIAGNOSIS — K7290 Hepatic failure, unspecified without coma: Secondary | ICD-10-CM | POA: Diagnosis not present

## 2020-06-03 DIAGNOSIS — Z91040 Latex allergy status: Secondary | ICD-10-CM

## 2020-06-03 DIAGNOSIS — K7200 Acute and subacute hepatic failure without coma: Principal | ICD-10-CM | POA: Diagnosis present

## 2020-06-03 DIAGNOSIS — Z90710 Acquired absence of both cervix and uterus: Secondary | ICD-10-CM

## 2020-06-03 DIAGNOSIS — Z86011 Personal history of benign neoplasm of the brain: Secondary | ICD-10-CM

## 2020-06-03 DIAGNOSIS — K920 Hematemesis: Secondary | ICD-10-CM | POA: Diagnosis not present

## 2020-06-03 DIAGNOSIS — K3189 Other diseases of stomach and duodenum: Secondary | ICD-10-CM | POA: Diagnosis present

## 2020-06-03 DIAGNOSIS — G2581 Restless legs syndrome: Secondary | ICD-10-CM | POA: Diagnosis present

## 2020-06-03 DIAGNOSIS — W19XXXA Unspecified fall, initial encounter: Secondary | ICD-10-CM | POA: Diagnosis not present

## 2020-06-03 DIAGNOSIS — R531 Weakness: Secondary | ICD-10-CM | POA: Diagnosis not present

## 2020-06-03 DIAGNOSIS — Z96642 Presence of left artificial hip joint: Secondary | ICD-10-CM | POA: Diagnosis present

## 2020-06-03 DIAGNOSIS — K7581 Nonalcoholic steatohepatitis (NASH): Secondary | ICD-10-CM | POA: Diagnosis present

## 2020-06-03 DIAGNOSIS — K2950 Unspecified chronic gastritis without bleeding: Secondary | ICD-10-CM | POA: Diagnosis not present

## 2020-06-03 DIAGNOSIS — Z886 Allergy status to analgesic agent status: Secondary | ICD-10-CM

## 2020-06-03 DIAGNOSIS — I469 Cardiac arrest, cause unspecified: Secondary | ICD-10-CM | POA: Diagnosis not present

## 2020-06-03 DIAGNOSIS — Z86010 Personal history of colonic polyps: Secondary | ICD-10-CM

## 2020-06-03 DIAGNOSIS — M1990 Unspecified osteoarthritis, unspecified site: Secondary | ICD-10-CM | POA: Diagnosis present

## 2020-06-03 DIAGNOSIS — Z5329 Procedure and treatment not carried out because of patient's decision for other reasons: Secondary | ICD-10-CM | POA: Diagnosis not present

## 2020-06-03 DIAGNOSIS — Z96653 Presence of artificial knee joint, bilateral: Secondary | ICD-10-CM | POA: Diagnosis present

## 2020-06-03 DIAGNOSIS — R569 Unspecified convulsions: Secondary | ICD-10-CM | POA: Diagnosis not present

## 2020-06-03 DIAGNOSIS — K766 Portal hypertension: Secondary | ICD-10-CM | POA: Diagnosis not present

## 2020-06-03 DIAGNOSIS — J984 Other disorders of lung: Secondary | ICD-10-CM | POA: Diagnosis not present

## 2020-06-03 DIAGNOSIS — R52 Pain, unspecified: Secondary | ICD-10-CM | POA: Diagnosis not present

## 2020-06-03 DIAGNOSIS — L8990 Pressure ulcer of unspecified site, unspecified stage: Secondary | ICD-10-CM | POA: Diagnosis not present

## 2020-06-03 DIAGNOSIS — B9561 Methicillin susceptible Staphylococcus aureus infection as the cause of diseases classified elsewhere: Secondary | ICD-10-CM | POA: Diagnosis present

## 2020-06-03 DIAGNOSIS — R54 Age-related physical debility: Secondary | ICD-10-CM | POA: Diagnosis present

## 2020-06-03 DIAGNOSIS — R791 Abnormal coagulation profile: Secondary | ICD-10-CM | POA: Diagnosis present

## 2020-06-03 DIAGNOSIS — F32A Depression, unspecified: Secondary | ICD-10-CM | POA: Diagnosis present

## 2020-06-03 DIAGNOSIS — K7460 Unspecified cirrhosis of liver: Secondary | ICD-10-CM

## 2020-06-03 DIAGNOSIS — G3184 Mild cognitive impairment, so stated: Secondary | ICD-10-CM | POA: Diagnosis present

## 2020-06-03 DIAGNOSIS — I82409 Acute embolism and thrombosis of unspecified deep veins of unspecified lower extremity: Secondary | ICD-10-CM | POA: Diagnosis not present

## 2020-06-03 DIAGNOSIS — N179 Acute kidney failure, unspecified: Secondary | ICD-10-CM | POA: Diagnosis present

## 2020-06-03 DIAGNOSIS — R918 Other nonspecific abnormal finding of lung field: Secondary | ICD-10-CM | POA: Diagnosis not present

## 2020-06-03 DIAGNOSIS — R69 Illness, unspecified: Secondary | ICD-10-CM | POA: Diagnosis not present

## 2020-06-03 DIAGNOSIS — Z888 Allergy status to other drugs, medicaments and biological substances status: Secondary | ICD-10-CM

## 2020-06-03 DIAGNOSIS — Z882 Allergy status to sulfonamides status: Secondary | ICD-10-CM

## 2020-06-03 DIAGNOSIS — R410 Disorientation, unspecified: Secondary | ICD-10-CM | POA: Diagnosis not present

## 2020-06-03 DIAGNOSIS — Z880 Allergy status to penicillin: Secondary | ICD-10-CM

## 2020-06-03 DIAGNOSIS — K7682 Hepatic encephalopathy: Secondary | ICD-10-CM

## 2020-06-03 DIAGNOSIS — Z452 Encounter for adjustment and management of vascular access device: Secondary | ICD-10-CM

## 2020-06-03 LAB — HEPATIC FUNCTION PANEL
ALT: 46 U/L — ABNORMAL HIGH (ref 0–44)
AST: 196 U/L — ABNORMAL HIGH (ref 15–41)
Albumin: 2.1 g/dL — ABNORMAL LOW (ref 3.5–5.0)
Alkaline Phosphatase: 160 U/L — ABNORMAL HIGH (ref 38–126)
Bilirubin, Direct: 1 mg/dL — ABNORMAL HIGH (ref 0.0–0.2)
Indirect Bilirubin: 1.5 mg/dL — ABNORMAL HIGH (ref 0.3–0.9)
Total Bilirubin: 2.5 mg/dL — ABNORMAL HIGH (ref 0.3–1.2)
Total Protein: 4.9 g/dL — ABNORMAL LOW (ref 6.5–8.1)

## 2020-06-03 LAB — BASIC METABOLIC PANEL
Anion gap: 9 (ref 5–15)
BUN: 39 mg/dL — ABNORMAL HIGH (ref 6–20)
CO2: 23 mmol/L (ref 22–32)
Calcium: 7.7 mg/dL — ABNORMAL LOW (ref 8.9–10.3)
Chloride: 113 mmol/L — ABNORMAL HIGH (ref 98–111)
Creatinine, Ser: 1.39 mg/dL — ABNORMAL HIGH (ref 0.44–1.00)
GFR, Estimated: 45 mL/min — ABNORMAL LOW (ref >60)
Glucose, Bld: 106 mg/dL — ABNORMAL HIGH (ref 70–99)
Potassium: 3.5 mmol/L (ref 3.5–5.1)
Sodium: 145 mmol/L (ref 135–145)

## 2020-06-03 LAB — CBC WITH DIFFERENTIAL/PLATELET
Abs Immature Granulocytes: 0.07 10*3/uL (ref 0.00–0.07)
Basophils Absolute: 0 10*3/uL (ref 0.0–0.1)
Basophils Relative: 0 %
Eosinophils Absolute: 0.2 10*3/uL (ref 0.0–0.5)
Eosinophils Relative: 2 %
HCT: 27.4 % — ABNORMAL LOW (ref 36.0–46.0)
Hemoglobin: 8.9 g/dL — ABNORMAL LOW (ref 12.0–15.0)
Immature Granulocytes: 1 %
Lymphocytes Relative: 21 %
Lymphs Abs: 1.8 10*3/uL (ref 0.7–4.0)
MCH: 31.2 pg (ref 26.0–34.0)
MCHC: 32.5 g/dL (ref 30.0–36.0)
MCV: 96.1 fL (ref 80.0–100.0)
Monocytes Absolute: 0.8 10*3/uL (ref 0.1–1.0)
Monocytes Relative: 10 %
Neutro Abs: 5.8 10*3/uL (ref 1.7–7.7)
Neutrophils Relative %: 66 %
Platelets: 214 10*3/uL (ref 150–400)
RBC: 2.85 MIL/uL — ABNORMAL LOW (ref 3.87–5.11)
RDW: 23.8 % — ABNORMAL HIGH (ref 11.5–15.5)
WBC: 8.7 10*3/uL (ref 4.0–10.5)
nRBC: 0 % (ref 0.0–0.2)

## 2020-06-03 LAB — URINALYSIS, ROUTINE W REFLEX MICROSCOPIC
Bilirubin Urine: NEGATIVE
Glucose, UA: NEGATIVE mg/dL
Ketones, ur: 5 mg/dL — AB
Nitrite: NEGATIVE
Protein, ur: 30 mg/dL — AB
Specific Gravity, Urine: 1.024 (ref 1.005–1.030)
pH: 5 (ref 5.0–8.0)

## 2020-06-03 LAB — LIPASE, BLOOD: Lipase: 111 U/L — ABNORMAL HIGH (ref 11–51)

## 2020-06-03 LAB — AMMONIA: Ammonia: 72 umol/L — ABNORMAL HIGH (ref 9–35)

## 2020-06-03 LAB — SARS CORONAVIRUS 2 (TAT 6-24 HRS): SARS Coronavirus 2: NEGATIVE

## 2020-06-03 LAB — LACTIC ACID, PLASMA
Lactic Acid, Venous: 1.6 mmol/L (ref 0.5–1.9)
Lactic Acid, Venous: 2 mmol/L (ref 0.5–1.9)

## 2020-06-03 MED ORDER — LACTULOSE ENEMA
300.0000 mL | Freq: Two times a day (BID) | ORAL | Status: DC
  Filled 2020-06-03 (×6): qty 300

## 2020-06-03 MED ORDER — CEFAZOLIN SODIUM-DEXTROSE 2-4 GM/100ML-% IV SOLN
2.0000 g | Freq: Three times a day (TID) | INTRAVENOUS | Status: DC
  Administered 2020-06-03 – 2020-06-06 (×9): 2 g via INTRAVENOUS
  Filled 2020-06-03 (×8): qty 100

## 2020-06-03 MED ORDER — LORAZEPAM 2 MG/ML IJ SOLN
1.0000 mg | INTRAMUSCULAR | Status: DC | PRN
  Administered 2020-06-03 – 2020-06-09 (×11): 1 mg via INTRAVENOUS
  Filled 2020-06-03 (×11): qty 1

## 2020-06-03 MED ORDER — ONDANSETRON HCL 4 MG/2ML IJ SOLN
4.0000 mg | Freq: Four times a day (QID) | INTRAMUSCULAR | Status: DC | PRN
  Administered 2020-06-05: 4 mg via INTRAVENOUS
  Filled 2020-06-03: qty 2

## 2020-06-03 MED ORDER — LACTULOSE 10 GM/15ML PO SOLN
20.0000 g | Freq: Three times a day (TID) | ORAL | Status: DC
  Administered 2020-06-03 – 2020-06-08 (×14): 20 g via ORAL
  Filled 2020-06-03 (×17): qty 30

## 2020-06-03 MED ORDER — ONDANSETRON HCL 4 MG PO TABS: 4.0000 mg | ORAL_TABLET | Freq: Four times a day (QID) | ORAL | Status: DC | PRN

## 2020-06-03 MED ORDER — LACTATED RINGERS IV BOLUS
500.0000 mL | Freq: Once | INTRAVENOUS | Status: AC
  Administered 2020-06-03: 500 mL via INTRAVENOUS

## 2020-06-03 MED ORDER — CHLORHEXIDINE GLUCONATE CLOTH 2 % EX PADS
6.0000 | MEDICATED_PAD | Freq: Every day | CUTANEOUS | Status: DC
  Administered 2020-06-04 – 2020-06-09 (×5): 6 via TOPICAL

## 2020-06-03 MED ORDER — HEPARIN SODIUM (PORCINE) 5000 UNIT/ML IJ SOLN
5000.0000 [IU] | Freq: Three times a day (TID) | INTRAMUSCULAR | Status: DC
  Administered 2020-06-03 – 2020-06-04 (×3): 5000 [IU] via SUBCUTANEOUS
  Filled 2020-06-03 (×3): qty 1

## 2020-06-03 MED ORDER — LEVETIRACETAM 100 MG/ML PO SOLN
1000.0000 mg | Freq: Two times a day (BID) | ORAL | Status: DC
  Administered 2020-06-03 – 2020-06-06 (×5): 1000 mg via ORAL
  Filled 2020-06-03 (×17): qty 10

## 2020-06-03 MED ORDER — LACTATED RINGERS IV SOLN: INTRAVENOUS | Status: DC

## 2020-06-03 MED ORDER — LACTULOSE 10 GM/15ML PO SOLN
10.0000 g | Freq: Once | ORAL | Status: AC
  Administered 2020-06-03: 10 g via ORAL
  Filled 2020-06-03: qty 30

## 2020-06-03 MED ORDER — LACTATED RINGERS IV SOLN: INTRAVENOUS | Status: AC

## 2020-06-03 MED ORDER — FOLIC ACID 1 MG PO TABS
1.0000 mg | ORAL_TABLET | Freq: Every day | ORAL | Status: DC
  Administered 2020-06-04: 1 mg via ORAL
  Filled 2020-06-03 (×3): qty 1

## 2020-06-03 MED ORDER — FOOD THICKENER (SIMPLYTHICK)
1.0000 | ORAL | Status: DC | PRN
  Filled 2020-06-03: qty 1

## 2020-06-03 MED ORDER — SODIUM CHLORIDE 0.9 % IV BOLUS
1000.0000 mL | Freq: Once | INTRAVENOUS | Status: AC
  Administered 2020-06-03: 1000 mL via INTRAVENOUS

## 2020-06-03 MED ORDER — LAMOTRIGINE 25 MG PO TABS
150.0000 mg | ORAL_TABLET | Freq: Two times a day (BID) | ORAL | Status: DC
  Administered 2020-06-03 – 2020-06-06 (×5): 150 mg via ORAL
  Filled 2020-06-03 (×2): qty 2
  Filled 2020-06-03 (×2): qty 1
  Filled 2020-06-03 (×3): qty 2
  Filled 2020-06-03: qty 1
  Filled 2020-06-03: qty 2
  Filled 2020-06-03: qty 1
  Filled 2020-06-03: qty 2

## 2020-06-03 NOTE — ED Provider Notes (Signed)
[facility] EMERGENCY DEPARTMENT Provider Note   CSN: [id number] Arrival date & time: 05/16/2020  0539     History Chief Complaint  Patient presents with  . Fall    [name] is a 55 y.o. female.  Patient is a 55 year old female with past medical history of seizures, hyperlipidemia, arthritis.  Patient has baseline altered mental status.  She is sent from the nursing home for evaluation of a fall.  Apparently the patient was found on the floor by a staff member.  Patient initially complaining of head pain.  She is somnolent here and does not offer much additional history.  The history is provided by the patient.       Past Medical History:  Diagnosis Date  . Arthritis    "knees" (04/22/2012)  . Brain tumor (benign) ([place])   . Chronic bronchitis ([place])    "yearly; when the weather changes" (04/22/2012)  . Colon polyp   . Colon polyps    adenomatous and hyperplastic-  . Depression   . Eczema   . Epilepsy ([place])    "been having them right often here lately" (04/22/2012)  . Fatty liver   . High cholesterol   . History of kidney stones   . Osteoarthritis    [name] 04/22/2012  . Other convulsions 05/21/12   non-epileptic spells  . Restless leg   . Seizures ([place])   . Vertigo     Patient Active Problem List   Diagnosis Date Noted  . Pressure injury of skin 05/26/2020  . Acute respiratory failure with hypoxia ([place]) 05/16/2020  . Severe sepsis with acute organ dysfunction due to methicillin susceptible Staphylococcus aureus (MSSA) ([place]) 05/16/2020  . Ascites   . Fever in adult 05/13/2020  . SIRS (systemic inflammatory response syndrome) ([place]) 05/13/2020  . Rib injury 05/13/2020  . Other cirrhosis of liver ([place]) 05/13/2020  . 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 Fever 05/13/2020  . Memory loss   . Transaminasemia   . Visual hallucination   . Generalized weakness 02/21/2020  . Fall at home, initial encounter 02/21/2020  . Hepatic encephalopathy ([place]) 02/21/2020  . Acute  metabolic encephalopathy 35/70/1779  . Hyperammonemia ([place]) 02/13/2020  . Meningioma ([place]) 09/12/2019  . Benign neoplasm of cerebral meninges ([place]) 09/09/2019  . Anxiety 12/30/2018  . MDD (major depressive disorder), recurrent episode, mild ([place]) 01/08/2018  . Widowed - July 2019 08/07/2017  . Sleep apnea 06/09/2017  . Colon polyp 06/05/2017  . Elevated liver function tests 06/05/2017  . Fatty liver   . Mild neurocognitive disorder 02/04/2017  . Seizure ([place]) 11/14/2016  . CAP (community acquired pneumonia) 11/09/2016  . 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 Lobar pneumonia ([place]) 11/28/2015  . Partial symptomatic epilepsy with complex partial seizures, not intractable, with status epilepticus ([place]) 07/24/2015  . OA (osteoarthritis) of knee 05/07/2015  . 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 Seizure disorder ([place]) 07/17/2011    Past Surgical History:  Procedure Laterality Date  . ABDOMINAL HYSTERECTOMY  2001  . BLADDER SUSPENSION    . BUNIONECTOMY Left 2000  . CESAREAN SECTION  1987; 1988  . COLONOSCOPY    . JOINT REPLACEMENT    . MASS EXCISION  10/22/2011   Procedure: EXCISION MASS;  Surgeon: [name], MD;  Location: [place];  Service: General;  Laterality: Right;  excision right buttock mass  . TEE WITHOUT CARDIOVERSION N/A 05/16/2020   Procedure: TRANSESOPHAGEAL ECHOCARDIOGRAM (TEE) WITH PROPOFOL;  Surgeon: Arnoldo Lenis, MD;  Location: [facility];  Service: Endoscopy;  Laterality: N/A;  . TOTAL HIP ARTHROPLASTY Left 1993; 1995; 2000  . TOTAL KNEE ARTHROPLASTY Left 05/07/2015   Procedure: TOTAL LEFT KNEE ARTHROPLASTY;  Surgeon: [name], MD;  Location: [facility];  Service: Orthopedics;  Laterality: Left;  . TOTAL KNEE ARTHROPLASTY Right 10/01/2015   Procedure: RIGHT TOTAL KNEE ARTHROPLASTY;  Surgeon: [name], MD;  Location: [facility];  Service: Orthopedics;  Laterality: Right;     OB History   No obstetric history on file.     Family History  Problem Relation Age of  Onset  . Cancer Mother   . Depression Father   . Cancer Brother   . Diabetes Brother   . Cancer Maternal Aunt   . Diabetes Maternal Aunt   . Bipolar disorder Son   . Drug abuse Son   . Heart attack Sister 86       during severe illness    Social History   Tobacco Use  . Smoking status: Never Smoker  . Smokeless tobacco: Never Used  Vaping Use  . Vaping Use: Never used  Substance Use Topics  . Alcohol use: No    Alcohol/week: 0.0 standard drinks    Comment: 12-25-2015 per pt no  . Drug use: No    Comment: 21-12-2015 per pt no     Home Medications Prior to Admission medications   Medication Sig Start Date End Date Taking? Authorizing Provider  ceFAZolin (ANCEF) IVPB Inject 2 g into the vein every 8 (eight) hours for 24 days. Indication:  MSSA bacteremia  First Dose: Yes Last Day of Therapy:  Jun 10, 2020 Labs - Once weekly:  CBC/D and BMP, Labs - Every other week:  ESR and CRP Method of administration: IV Push Method of administration may be changed at the discretion of home infusion pharmacist based upon assessment of the patient and/or caregiver's ability to self-administer the medication ordered. 05/18/20 06/11/20  Orson Eva, MD  FLUoxetine (PROZAC) 40 MG capsule Take 2 capsules (80 mg total) by mouth daily. 04/26/20 07/25/20  Norman Clay, MD  folic acid (FOLVITE) 1 MG tablet Take 1 tablet (1 mg total) by mouth daily. 02/24/20   Orson Eva, MD  food thickener (SIMPLYTHICK, NECTAR/LEVEL 2/MILDLY THICK,) GEL Take 1 packet by mouth as needed (as needed for levetiracetam solution administration). 05/27/20   Barton Dubois, MD  furosemide (LASIX) 40 MG tablet Take 1 tablet (40 mg total) by mouth daily. 05/28/20   Barton Dubois, MD  lactulose (CHRONULAC) 10 GM/15ML solution Take 30 mLs (20 g total) by mouth 3 (three) times daily. 05/27/20   Barton Dubois, MD  lamoTRIgine (LAMICTAL) 150 MG tablet Take 1 tablet (150 mg total) by mouth 2 (two) times daily. For mood control Patient  taking differently: Take 150 mg by mouth 2 (two) times daily. 12/26/19   Ventura Sellers, MD  levETIRAcetam (KEPPRA) 100 MG/ML solution Take 10 mLs (1,000 mg total) by mouth 2 (two) times daily. 05/27/20   Barton Dubois, MD  LORazepam (ATIVAN) 1 MG tablet Take 1 tablet (1 mg total) by mouth daily as needed for anxiety. 03/26/20   Norman Clay, MD  metoprolol tartrate (LOPRESSOR) 25 MG tablet Take 0.5 tablets (12.5 mg total) by mouth 2 (two) times daily. 05/21/20   Orson Eva, MD  senna-docusate (SENOKOT-S) 8.6-50 MG tablet Take 1 tablet by mouth 2 (two) times daily. 05/27/20   Barton Dubois, MD    Allergies    Acetaminophen, Benadryl [diphenhydramine hcl], Codeine, Dilantin [phenytoin sodium extended], Melatonin, Tramadol, Ultram [  tramadol hcl], Vimpat [lacosamide], Mirtazapine, Betadine [povidone iodine], Latex, Penicillins, Sulfa antibiotics, Tape, and Vicodin [hydrocodone-acetaminophen]  Review of Systems   Review of Systems  Unable to perform ROS: Other    Physical Exam Updated Vital Signs BP 94/67 (BP Location: Left Arm)   Pulse 74   Temp 98.1 F (36.7 C) (Oral)   Resp 14   Ht 5' (1.524 m)   Wt 63 kg   SpO2 98%   BMI 27.13 kg/m   Physical Exam Vitals and nursing note reviewed.  Constitutional:      General: She is not in acute distress.    Appearance: She is well-developed. She is not diaphoretic.  HENT:     Head: Normocephalic and atraumatic.     Comments: I see no abrasions, lacerations, contusions, or swelling to the scalp or head. Eyes:     Pupils: Pupils are equal, round, and reactive to light.  Neck:     Comments: There is no cervical spine tenderness or step-off. Cardiovascular:     Rate and Rhythm: Normal rate and regular rhythm.     Heart sounds: No murmur heard. No friction rub. No gallop.   Pulmonary:     Effort: Pulmonary effort is normal. No respiratory distress.     Breath sounds: Normal breath sounds. No wheezing.  Abdominal:     General: Bowel  sounds are normal. There is no distension.     Palpations: Abdomen is soft.     Tenderness: There is no abdominal tenderness.  Musculoskeletal:        General: Normal range of motion.     Cervical back: Normal range of motion and neck supple.  Skin:    General: Skin is warm and dry.  Neurological:     Mental Status: She is alert.     Comments: Patient is somnolent, but arousable.  She does not offer much additional history and does not follow commands appropriately, but does move all extremities with purpose. I am told that this is her baseline.  Remainder of neurologic exam and possible secondary to baseline mental capacity.     ED Results / Procedures / Treatments   Labs (all labs ordered are listed, but only abnormal results are displayed) Labs Reviewed - No data to display  EKG None  Radiology No results found.  Procedures Procedures   Medications Ordered in ED Medications  sodium chloride 0.9 % bolus 1,000 mL (has no administration in time range)    ED Course  I have reviewed the triage vital signs and the nursing notes.  Pertinent labs & imaging results that were available during my care of the patient were reviewed by me and considered in my medical decision making (see chart for details).    MDM Rules/Calculators/A&P  Patient sent to the ER after being found on the floor of her extended care facility.  Patient is somnolent and difficult to arouse, but has no overt signs of trauma on physical exam.  Patient to go for head CT.  Laboratory studies are pending.  She did have recent admission for MSSA sepsis and is currently receiving IV antibiotics through a PICC line.  She is afebrile here with no white count.  Lactate pending.  Patient also found to have elevated ammonia level on previous admission.  An ammonia level has been added on and is also pending.  Patient to be reassessed once laboratory studies have resulted and final disposition to be determined at  that time.  Care signed out  to Dr. Laverta Baltimore at shift change.  He will obtain the results of the studies and make the appropriate disposition.  Final Clinical Impression(s) / ED Diagnoses Final diagnoses:  None    Rx / DC Orders ED Discharge Orders    None       Veryl Speak, MD 06/04/20 [phone]

## 2020-06-03 NOTE — Progress Notes (Signed)
Pt has arrived to 300 floor. She is resting in bed responsive to voice with mumbles and groans. No pain complaints. BP was 89/54 on arrival. LR 500 bolus given as ordered. MD made aware. Will continue to monitor patient. VS stable otherwise. Afebrile.

## 2020-06-03 NOTE — ED Notes (Signed)
Date and time results received: 06-18-20 858  Test: Lactic Acid  Critical Value: 2.0  Name of Provider Notified: Rodena Goldmann, DO  Orders Received? Or Actions Taken?: See new orders

## 2020-06-03 NOTE — H&P (Addendum)
History and Physical    [name] TDS:[id number] DOB: 12-23-1965 DOA: 06/08/2020  PCP: Caprice Renshaw, MD   Patient coming from: [facility]  Chief Complaint: Fall/AMS  HPI: [name] is a 55 y.o. female with medical history significant for COPD, meningioma, cardiac arrest December 2020, hepatic steatosis,PNES, cognitive impairment, depression, and complex partial seizure who presented to the ED after being found down at her skilled facility.  She was noted to be confused and was initially complaining of head pain.  No further history can otherwise be obtained given her current condition.  She was recently discharged on 5/15 after admission for severe sepsis due to MSSA bacteremia and is currently been receiving Ancef through PICC line and is supposed to continue having this through 5/29.   ED Course: Vital signs with some initial soft blood pressure readings and hemoglobin 8.9.  BUN 39, creatinine 1.39.  She was given IV fluid in the ED and CT head was checked and negative for any acute findings.  Chest x-ray with chronic interstitial changes noted.  Ammonia level of 72 noted and she has been given lactulose in the ED.  Review of Systems: Cannot be obtained given current condition.  Past Medical History:  Diagnosis Date  . Arthritis    "knees" (04/22/2012)  . Brain tumor (benign) ([place])   . Chronic bronchitis ([place])    "yearly; when the weather changes" (04/22/2012)  . Colon polyp   . Colon polyps    adenomatous and hyperplastic-  . Depression   . Eczema   . Epilepsy ([place])    "been having them right often here lately" (04/22/2012)  . Fatty liver   . High cholesterol   . History of kidney stones   . Osteoarthritis    [name] 04/22/2012  . Other convulsions 05/21/12   non-epileptic spells  . Restless leg   . Seizures ([place])   . Vertigo     Past Surgical History:  Procedure Laterality Date  . ABDOMINAL HYSTERECTOMY  2001  . BLADDER SUSPENSION    . BUNIONECTOMY Left 2000   . CESAREAN SECTION  1987; 1988  . COLONOSCOPY    . JOINT REPLACEMENT    . MASS EXCISION  10/22/2011   Procedure: EXCISION MASS;  Surgeon: [name], MD;  Location: [place];  Service: General;  Laterality: Right;  excision right buttock mass  . TEE WITHOUT CARDIOVERSION N/A 05/16/2020   Procedure: TRANSESOPHAGEAL ECHOCARDIOGRAM (TEE) WITH PROPOFOL;  Surgeon: Arnoldo Lenis, MD;  Location: [facility];  Service: Endoscopy;  Laterality: N/A;  . TOTAL HIP ARTHROPLASTY Left 1993; 1995; 2000  . TOTAL KNEE ARTHROPLASTY Left 05/07/2015   Procedure: TOTAL LEFT KNEE ARTHROPLASTY;  Surgeon: [name], MD;  Location: [facility];  Service: Orthopedics;  Laterality: Left;  . TOTAL KNEE ARTHROPLASTY Right 10/01/2015   Procedure: RIGHT TOTAL KNEE ARTHROPLASTY;  Surgeon: [name], MD;  [facility];  Service: Orthopedics;  Laterality: Right;     reports that she has never smoked. She has never used smokeless tobacco. She reports that she does not drink alcohol and does not use drugs.  Allergies  Allergen Reactions  . Acetaminophen Other (See Comments)    Has fatty deposits on liver  . Benadryl [Diphenhydramine Hcl] Other (See Comments)    hyperactivity and seizures  . Codeine Itching and Rash    seizures  . Dilantin [Phenytoin Sodium Extended] Other (See Comments)    Elevated LFT's  . Melatonin Other (See Comments)  seizures  . Tramadol Other (See Comments)    "causes seizures"  . Ultram [Tramadol Hcl] Other (See Comments)    Seizures  . Vimpat [Lacosamide] Other (See Comments)    Severe dizziness  . Mirtazapine Other (See Comments)    Potentiated seizure activity  . Betadine [Povidone Iodine] Rash  . Latex Rash  . Penicillins Itching and Rash    Patient can tolerate cephalosporins  . Sulfa Antibiotics Rash  . Tape Rash and Other (See Comments)    Paper tape please Paper tape please  . Vicodin [Hydrocodone-Acetaminophen] Itching    Family History  Problem Relation Age of  Onset  . Cancer Mother   . Depression Father   . Cancer Brother   . Diabetes Brother   . Cancer Maternal Aunt   . Diabetes Maternal Aunt   . Bipolar disorder Son   . Drug abuse Son   . Heart attack Sister 70       during severe illness    Prior to Admission medications   Medication Sig Start Date End Date Taking? Authorizing Provider  ceFAZolin (ANCEF) IVPB Inject 2 g into the vein every 8 (eight) hours for 24 days. Indication:  MSSA bacteremia  First Dose: Yes Last Day of Therapy:  Jun 10, 2020 Labs - Once weekly:  CBC/D and BMP, Labs - Every other week:  ESR and CRP Method of administration: IV Push Method of administration may be changed at the discretion of home infusion pharmacist based upon assessment of the patient and/or caregiver's ability to self-administer the medication ordered. 05/18/20 06/11/20  Orson Eva, MD  FLUoxetine (PROZAC) 40 MG capsule Take 2 capsules (80 mg total) by mouth daily. 04/26/20 07/25/20  Norman Clay, MD  folic acid (FOLVITE) 1 MG tablet Take 1 tablet (1 mg total) by mouth daily. 02/24/20   Orson Eva, MD  food thickener (SIMPLYTHICK, NECTAR/LEVEL 2/MILDLY THICK,) GEL Take 1 packet by mouth as needed (as needed for levetiracetam solution administration). 05/27/20   Barton Dubois, MD  furosemide (LASIX) 40 MG tablet Take 1 tablet (40 mg total) by mouth daily. 05/28/20   Barton Dubois, MD  lactulose (CHRONULAC) 10 GM/15ML solution Take 30 mLs (20 g total) by mouth 3 (three) times daily. 05/27/20   Barton Dubois, MD  lamoTRIgine (LAMICTAL) 150 MG tablet Take 1 tablet (150 mg total) by mouth 2 (two) times daily. For mood control Patient taking differently: Take 150 mg by mouth 2 (two) times daily. 12/26/19   Ventura Sellers, MD  levETIRAcetam (KEPPRA) 100 MG/ML solution Take 10 mLs (1,000 mg total) by mouth 2 (two) times daily. 05/27/20   Barton Dubois, MD  LORazepam (ATIVAN) 1 MG tablet Take 1 tablet (1 mg total) by mouth daily as needed for anxiety. 03/26/20    Norman Clay, MD  metoprolol tartrate (LOPRESSOR) 25 MG tablet Take 0.5 tablets (12.5 mg total) by mouth 2 (two) times daily. 05/21/20   Orson Eva, MD  senna-docusate (SENOKOT-S) 8.6-50 MG tablet Take 1 tablet by mouth 2 (two) times daily. 05/27/20   Barton Dubois, MD    Physical Exam: Vitals:   05/24/2020 0830 05/13/2020 0900 05/16/2020 0930 06/06/2020 1000  BP: 98/62 97/72 (!) 84/67 91/62  Pulse:      Resp:      Temp:      TempSrc:      SpO2:      Weight:      Height:        Constitutional: NAD, confused/incoherent and minimally responsive  to questioning Vitals:   05/28/2020 0830 05/26/2020 0900 05/17/2020 0930 05/15/2020 1000  BP: 98/62 97/72 (!) 84/67 91/62  Pulse:      Resp:      Temp:      TempSrc:      SpO2:      Weight:      Height:       Eyes: lids and conjunctivae normal Neck: normal, supple Respiratory: clear to auscultation bilaterally. Normal respiratory effort. No accessory muscle use.  Cardiovascular: Regular rate and rhythm, no murmurs. Abdomen: no tenderness, no distention. Bowel sounds positive.  Musculoskeletal:  No edema. Skin: no rashes, lesions, ulcers.  Psychiatric: Cannot be assessed given current condition  Labs on Admission: I have personally reviewed following labs and imaging studies  CBC: Recent Labs  Lab 05/29/20 0436 06/11/2020 0605  WBC 12.2* 8.7  NEUTROABS 9.1* 5.8  HGB 8.5* 8.9*  HCT 26.2* 27.4*  MCV 94.6 96.1  PLT 250 725   Basic Metabolic Panel: Recent Labs  Lab 05/29/20 0436 05/28/2020 0605  NA 143 145  K 3.6 3.5  CL 109 113*  CO2 26 23  GLUCOSE 121* 106*  BUN 24* 39*  CREATININE 0.77 1.39*  CALCIUM 7.9* 7.7*   GFR: Estimated Creatinine Clearance: 38.3 mL/min (A) (by C-G formula based on SCr of 1.39 mg/dL (H)). Liver Function Tests: Recent Labs  Lab 05/29/20 0436 06/11/2020 0605  AST 141* 196*  ALT 35 46*  ALKPHOS 162* 160*  BILITOT 3.1* 2.5*  PROT 5.1* 4.9*  ALBUMIN 2.1* 2.1*   Recent Labs  Lab 06/10/2020 0605  LIPASE  111*   Recent Labs  Lab 05/29/20 0437 05/13/2020 0702  AMMONIA 29 72*   Coagulation Profile: Recent Labs  Lab 05/29/20 0436  INR 2.1*   Cardiac Enzymes: No results for input(s): CKTOTAL, CKMB, CKMBINDEX, TROPONINI in the last 168 hours. BNP (last 3 results) No results for input(s): PROBNP in the last 8760 hours. HbA1C: No results for input(s): HGBA1C in the last 72 hours. CBG: No results for input(s): GLUCAP in the last 168 hours. Lipid Profile: No results for input(s): CHOL, HDL, LDLCALC, TRIG, CHOLHDL, LDLDIRECT in the last 72 hours. Thyroid Function Tests: No results for input(s): TSH, T4TOTAL, FREET4, T3FREE, THYROIDAB in the last 72 hours. Anemia Panel: No results for input(s): VITAMINB12, FOLATE, FERRITIN, TIBC, IRON, RETICCTPCT in the last 72 hours. Urine analysis:    Component Value Date/Time   COLORURINE AMBER (A) 05/13/2020 0605   APPEARANCEUR HAZY (A) 05/18/2020 0605   APPEARANCEUR Clear 01/30/2017 1020   LABSPEC 1.024 05/26/2020 0605   PHURINE 5.0 05/26/2020 0605   GLUCOSEU NEGATIVE 05/14/2020 0605   HGBUR MODERATE (A) 05/16/2020 0605   BILIRUBINUR NEGATIVE 05/23/2020 0605   BILIRUBINUR Negative 01/30/2017 1020   KETONESUR 5 (A) 05/17/2020 0605   PROTEINUR 30 (A) 05/28/2020 0605   UROBILINOGEN negative 04/19/2014 1332   UROBILINOGEN 0.2 12/04/2013 1414   NITRITE NEGATIVE 05/29/2020 0605   LEUKOCYTESUR TRACE (A) 06/06/2020 0605    Radiological Exams on Admission: CT Head Wo Contrast  Result Date: 06/10/2020 CLINICAL DATA:  Head trauma. Found on floor by staff. Baseline altered mental status. EXAM: CT HEAD WITHOUT CONTRAST TECHNIQUE: Contiguous axial images were obtained from the base of the skull through the vertex without intravenous contrast. COMPARISON:  CT head 05/30/2018 FINDINGS: Brain: No evidence of large-territorial acute infarction. No parenchymal hemorrhage. No mass lesion. No extra-axial collection. No mass effect or midline shift. No  hydrocephalus. Basilar cisterns are patent. Vascular: No  hyperdense vessel. Skull: No acute fracture or focal lesion. Sinuses/Orbits: Paranasal sinuses and mastoid air cells are clear. The orbits are unremarkable. Other: None. IMPRESSION: No acute intracranial abnormality. Electronically Signed   By: Iven Finn M.D.   On: 05/15/2020 06:40   DG Chest Port 1 View  Result Date: 05/29/2020 CLINICAL DATA:  55 year old female found down.  Weakness. EXAM: PORTABLE CHEST 1 VIEW COMPARISON:  Portable chest 05/24/2020 and earlier. Modified barium swallow 05/25/2020. FINDINGS: Residual barium mixed with retained stool in the upper abdominal large bowel. Continued low lung volumes. Stable right PICC line. Mediastinal contours remain normal. Diffuse coarse bilateral pulmonary opacity appears to be chronic and stable over this year's series of exams. No superimposed pneumothorax, pleural effusion or confluent pulmonary opacity. Visualized tracheal air column is within normal limits. No acute osseous abnormality identified. IMPRESSION: 1. Continued low lung volumes and coarse bilateral pulmonary interstitial markings which do not appear significantly changed since January. Favor a chronic interstitial lung disease over an acute pulmonary process. 2. PICC line remains in place. 3. Residual barium from a modified swallow 9 days ago mixed with residual stool in the colon. Electronically Signed   By: Genevie Ann M.D.   On: 05/18/2020 07:27    Assessment/Plan Active Problems:   Acute hepatic encephalopathy    Acute hepatic encephalopathy -Continue on lactulose and monitor ammonia levels -Uncertain if she has been taking her medications or not versus need for adjustment  AKI -Likely prerenal -Continue IV fluid hydration -Avoid nephrotoxic agents and monitor intake and output -Recheck a.m. labs  Lactic acidosis -Likely related to dehydration -Continue to monitor  MSSA bacteremia -Continue on Ancef through 5/29  to finish treatment  Urinary retention -Foley catheter in place from prior admission -No signs of UTI currently noted -Plan to attempt voiding trial once more awake and alert  Chronic pain -Hold home medications until more alert and awake  Liver cirrhosis -Elevated ammonia levels as noted below, start lactulose  Anemia of chronic disease-stable -Transfuse for hemoglobin less than 7 -No overt bleeding noted  Recurrent gait instability with frequent falls/dizziness -CT head with no acute findings -Patient already at SNF -Fall precautions and PT evaluation while admitted  Complex partial seizures/PNES -Continue home Keppra and Lamictal -No seizure activity appreciated at facility -May need to obtain EEG if condition does not improve with lactulose  Depression -Hold fluoxetine and lorazepam until mentation improves  Meningioma -Follows with Dr. [name] outpatient  Transaminitis -Appears to be a persistent problem in the setting of liver cirrhosis/NAFLD with hepatic congestion -Continue to monitor  Restless leg -Continue holding Mirapex due to prior confusion -No signs of UTI currently noted   DVT prophylaxis: Heparin Code Status: Full Family Communication: Tried calling niece with no response 5/22 Disposition Plan:Admit for hepatic encephalopathy Consults called:Palliative Admission status: Inpatient, Tele   Rosmarie Esquibel D Normal Recinos DO [facility]  If 7PM-7AM, please contact night-coverage [email or web address]  06/10/2020, 10:14 AM

## 2020-06-03 NOTE — ED Triage Notes (Signed)
Pt from [facility] for evaluation after pt found in the floor by staff. Per staff, pt c/o head pain. Difficult to get much info from pt d/t AMS (baseline).

## 2020-06-03 NOTE — ED Notes (Signed)
Pt transported to CT at this time.

## 2020-06-03 NOTE — Progress Notes (Signed)
   06/02/2020 1612  Vitals  Temp 98.3 F (36.8 C)  Temp Source Oral  BP (!) 89/54  MAP (mmHg) 65  BP Location Left Arm  BP Method Automatic  Patient Position (if appropriate) Lying  Pulse Rate 83  Pulse Rate Source Monitor  Resp 20  Level of Consciousness  Level of Consciousness Responds to Voice  MEWS COLOR  MEWS Score Color Yellow  Oxygen Therapy  SpO2 100 %  O2 Device Room Air  MEWS Score  MEWS Temp 0  MEWS Systolic 1  MEWS Pulse 0  MEWS RR 0  MEWS LOC 1  MEWS Score 2  Provider Notification  Provider Name/Title Dr Manuella Ghazi  Date Provider Notified 05/23/2020  Time Provider Notified 1615  Notification Type Page (secure chat)  Notification Reason Other (Comment) (soft bp responsiveness)

## 2020-06-03 NOTE — ED Provider Notes (Signed)
Blood pressure (!) 84/62, pulse 74, temperature 98.1 F (36.7 C), temperature source Oral, resp. rate 14, height 5' (1.524 m), weight 63 kg, SpO2 98 %.  Assuming care from Dr. Stark Jock.  In short, [name] is a 55 y.o. female with a chief complaint of Fall .  Refer to the original H&P for additional details.  The current plan of care is to f/u labs and reassess after IVF.  Patient with elevated ammonia. Will admit for hepatic encephalopathy.   Discussed patient's case with [facility] to request admission. Patient and family (if present) updated with plan. Care transferred to [facility] service.  I reviewed all nursing notes, vitals, pertinent old records, EKGs, labs, imaging (as available).     Margette Fast, MD 06/07/20 [phone]

## 2020-06-03 NOTE — ED Notes (Signed)
Pt returned form CT at this time.  

## 2020-06-04 ENCOUNTER — Encounter (HOSPITAL_COMMUNITY): Payer: Self-pay | Admitting: Internal Medicine

## 2020-06-04 ENCOUNTER — Inpatient Hospital Stay (HOSPITAL_COMMUNITY): Payer: Medicare HMO

## 2020-06-04 DIAGNOSIS — K7290 Hepatic failure, unspecified without coma: Secondary | ICD-10-CM

## 2020-06-04 DIAGNOSIS — Z7189 Other specified counseling: Secondary | ICD-10-CM | POA: Diagnosis not present

## 2020-06-04 DIAGNOSIS — Z515 Encounter for palliative care: Secondary | ICD-10-CM | POA: Diagnosis not present

## 2020-06-04 DIAGNOSIS — K209 Esophagitis, unspecified without bleeding: Secondary | ICD-10-CM

## 2020-06-04 DIAGNOSIS — K2970 Gastritis, unspecified, without bleeding: Secondary | ICD-10-CM | POA: Diagnosis not present

## 2020-06-04 DIAGNOSIS — K3189 Other diseases of stomach and duodenum: Secondary | ICD-10-CM

## 2020-06-04 DIAGNOSIS — K766 Portal hypertension: Secondary | ICD-10-CM | POA: Diagnosis not present

## 2020-06-04 DIAGNOSIS — K920 Hematemesis: Secondary | ICD-10-CM

## 2020-06-04 DIAGNOSIS — K7200 Acute and subacute hepatic failure without coma: Secondary | ICD-10-CM | POA: Diagnosis not present

## 2020-06-04 LAB — COMPREHENSIVE METABOLIC PANEL
ALT: 39 U/L (ref 0–44)
AST: 180 U/L — ABNORMAL HIGH (ref 15–41)
Albumin: 2.3 g/dL — ABNORMAL LOW (ref 3.5–5.0)
Alkaline Phosphatase: 155 U/L — ABNORMAL HIGH (ref 38–126)
Anion gap: 8 (ref 5–15)
BUN: 29 mg/dL — ABNORMAL HIGH (ref 6–20)
CO2: 25 mmol/L (ref 22–32)
Calcium: 7.8 mg/dL — ABNORMAL LOW (ref 8.9–10.3)
Chloride: 114 mmol/L — ABNORMAL HIGH (ref 98–111)
Creatinine, Ser: 1.01 mg/dL — ABNORMAL HIGH (ref 0.44–1.00)
GFR, Estimated: >60 mL/min (ref >60)
Glucose, Bld: 102 mg/dL — ABNORMAL HIGH (ref 70–99)
Potassium: 3.4 mmol/L — ABNORMAL LOW (ref 3.5–5.1)
Sodium: 147 mmol/L — ABNORMAL HIGH (ref 135–145)
Total Bilirubin: 2.6 mg/dL — ABNORMAL HIGH (ref 0.3–1.2)
Total Protein: 5.2 g/dL — ABNORMAL LOW (ref 6.5–8.1)

## 2020-06-04 LAB — LACTIC ACID, PLASMA: Lactic Acid, Venous: 1.9 mmol/L (ref 0.5–1.9)

## 2020-06-04 LAB — CBC
HCT: 27.6 % — ABNORMAL LOW (ref 36.0–46.0)
Hemoglobin: 9 g/dL — ABNORMAL LOW (ref 12.0–15.0)
MCH: 31.5 pg (ref 26.0–34.0)
MCHC: 32.6 g/dL (ref 30.0–36.0)
MCV: 96.5 fL (ref 80.0–100.0)
Platelets: 213 10*3/uL (ref 150–400)
RBC: 2.86 MIL/uL — ABNORMAL LOW (ref 3.87–5.11)
RDW: 24.9 % — ABNORMAL HIGH (ref 11.5–15.5)
WBC: 9.1 10*3/uL (ref 4.0–10.5)
nRBC: 0.2 % (ref 0.0–0.2)

## 2020-06-04 LAB — PROTIME-INR
INR: 2.3 — ABNORMAL HIGH (ref 0.8–1.2)
Prothrombin Time: 25.1 seconds — ABNORMAL HIGH (ref 11.4–15.2)

## 2020-06-04 LAB — MRSA PCR SCREENING: MRSA by PCR: NEGATIVE

## 2020-06-04 LAB — MAGNESIUM: Magnesium: 2.2 mg/dL (ref 1.7–2.4)

## 2020-06-04 LAB — AMMONIA: Ammonia: 55 umol/L — ABNORMAL HIGH (ref 9–35)

## 2020-06-04 LAB — HEMOGLOBIN AND HEMATOCRIT, BLOOD
HCT: 30.2 % — ABNORMAL LOW (ref 36.0–46.0)
HCT: 28.2 % — ABNORMAL LOW (ref 36.0–46.0)
Hemoglobin: 9.6 g/dL — ABNORMAL LOW (ref 12.0–15.0)
Hemoglobin: 8.9 g/dL — ABNORMAL LOW (ref 12.0–15.0)

## 2020-06-04 MED ORDER — POTASSIUM CHLORIDE 10 MEQ/100ML IV SOLN
10.0000 meq | INTRAVENOUS | Status: AC
  Administered 2020-06-04 (×4): 10 meq via INTRAVENOUS
  Filled 2020-06-04 (×4): qty 100

## 2020-06-04 MED ORDER — PANTOPRAZOLE SODIUM 40 MG IV SOLR: 40.0000 mg | Freq: Two times a day (BID) | INTRAVENOUS | Status: DC

## 2020-06-04 MED ORDER — SODIUM CHLORIDE 0.9 % IV SOLN
80.0000 mg | Freq: Once | INTRAVENOUS | Status: AC
  Administered 2020-06-04: 80 mg via INTRAVENOUS
  Filled 2020-06-04: qty 80

## 2020-06-04 MED ORDER — SODIUM CHLORIDE 0.9 % IV SOLN
8.0000 mg/h | INTRAVENOUS | Status: DC
  Administered 2020-06-04 (×2): 8 mg/h via INTRAVENOUS
  Filled 2020-06-04 (×6): qty 80

## 2020-06-04 MED ORDER — SODIUM CHLORIDE 0.9 % IV SOLN
50.0000 ug/h | INTRAVENOUS | Status: DC
  Administered 2020-06-04 – 2020-06-05 (×3): 50 ug/h via INTRAVENOUS
  Filled 2020-06-04 (×6): qty 1

## 2020-06-04 MED ORDER — OCTREOTIDE LOAD VIA INFUSION
25.0000 ug | Freq: Once | INTRAVENOUS | Status: AC
  Administered 2020-06-04: 25 ug via INTRAVENOUS
  Filled 2020-06-04: qty 13

## 2020-06-04 MED ORDER — SODIUM CHLORIDE 0.9 % IV SOLN
1.0000 g | Freq: Every day | INTRAVENOUS | Status: DC
  Administered 2020-06-04 – 2020-06-06 (×2): 1 g via INTRAVENOUS
  Filled 2020-06-04 (×2): qty 10

## 2020-06-04 MED ORDER — VITAMIN K1 10 MG/ML IJ SOLN
10.0000 mg | Freq: Once | INTRAVENOUS | Status: AC
  Administered 2020-06-04: 10 mg via INTRAVENOUS
  Filled 2020-06-04: qty 1

## 2020-06-04 MED ORDER — LACTATED RINGERS IV SOLN: INTRAVENOUS | Status: DC

## 2020-06-04 NOTE — Progress Notes (Signed)
PROGRESS NOTE    LANIA ZAWISTOWSKI  CLE:[id number] DOB: 01/20/1965 DOA: 06/08/2020 PCP: Caprice Renshaw, MD   Brief Narrative:   [name] is a 55 y.o. female with medical history significant for COPD, meningioma, cardiac arrest December 2020, hepatic steatosis,PNES, cognitive impairment, depression, and complex partial seizure who presented to the ED after being found down at her skilled facility.  She was admitted with acute hepatic encephalopathy.  5/23 she did develop some hematemesis that will require closer observation and monitoring as well as GI evaluation with endoscopy.  Assessment & Plan:   Active Problems:   Acute hepatic encephalopathy   Acute hepatic encephalopathy -Continue on lactulose and monitor ammonia levels which are improving -Uncertain if she has been taking her medications or not versus need for adjustment  Hematemesis -Started on morning of 5/23 -Start Protonix and Octreotide infusions -Transfer to SDU -Heparin to SCDs -Keep NPO -Appreciate GI evaluation -Monitor repeat H/H and transfuse for H<7  AKI-improving -Likely prerenal -Continue IV fluid hydration -Avoid nephrotoxic agents and monitor intake and output -Recheck a.m. labs  Lactic acidosis-stable -Likely related to dehydration -Continue to monitor  MSSA bacteremia -Continue on Ancef through 5/29 to finish treatment  Urinary retention -Foley catheter in place from prior admission -No signs of UTI currently noted -Plan to attempt voiding trial once more awake and alert  Chronic pain -Hold home medications until more alert and awake  Liver cirrhosis -Elevated ammonia levels as noted below, start lactulose  Anemia of chronic disease-stable -Transfuse for hemoglobin less than 7 -No overt bleeding noted  Recurrent gait instability with frequent falls/dizziness -CT head with no acute findings -Patient already at SNF -Fall precautions and PT evaluation while  admitted  Complex partial seizures/PNES -Continue home Keppra and Lamictal -No seizure activity appreciated at facility -May need to obtain EEG if condition does not improve with lactulose  Depression -Hold fluoxetine and lorazepam until mentation improves  Meningioma -Follows with Dr. [name] outpatient  Transaminitis -Appears to be a persistent problem in the setting of liver cirrhosis/NAFLD with hepatic congestion -Continue to monitor  Restless leg -Continue holding Mirapex due to prior confusion -No signs of UTI currently noted  DVT prophylaxis: Heparin to SCDs Code Status: Full Family Communication: Discussed with niece on phone 5/23 Disposition Plan:  Status is: Inpatient  Remains inpatient appropriate because:Ongoing diagnostic testing needed not appropriate for outpatient work up, IV treatments appropriate due to intensity of illness or inability to take PO and Inpatient level of care appropriate due to severity of illness   Dispo: The patient is from: SNF              Anticipated d/c is to: SNF              Patient currently is not medically stable to d/c.   Difficult to place patient No    Skin Assessment:  I have examined the patient's skin and I agree with the wound assessment as performed by the wound care RN as outlined below:  Pressure Injury 05/25/20 Sacrum Mid Stage 1 -  Intact skin with non-blanchable redness of a localized area usually over a bony prominence. (Active)  05/25/20 1746  Location: Sacrum  Location Orientation: Mid  Staging: Stage 1 -  Intact skin with non-blanchable redness of a localized area usually over a bony prominence.  Wound Description (Comments):   Present on Admission: No    Consultants:   GI  Procedures:   See below  Antimicrobials:  Anti-infectives (From  admission, onward)   Start     Dose/Rate Route Frequency Ordered Stop   05/21/2020 1400  ceFAZolin (ANCEF) IVPB 2g/100 mL premix       Note to Pharmacy:  Indication:  MSSA bacteremia  First Dose: Yes Last Day of Therapy:  Jun 10, 2020 Labs - Once weekly:  CBC/D and BMP, Labs - Every other week:  ESR and CRP Method of administration: IV Push Method of administration may be changed at the discretion of home infusion pharmacist based upon assessme   2 g 200 mL/hr over 30 Minutes Intravenous Every 8 hours 05/20/2020 1044         Subjective: Patient seen and evaluated today and continues to have ongoing confusion.  No acute overnight events, however she has just had an episode of hematemesis and now some worsening confusion.  Objective: Vitals:   06/02/2020 2115 06/04/20 0015 06/04/20 0421 06/04/20 1140  BP: 102/72 104/68 113/71 128/85  Pulse: 81 78 90 90  Resp: 18 18    Temp: (!) 97.4 F (36.3 C) 98.3 F (36.8 C) 98 F (36.7 C)   TempSrc: Oral Oral Oral   SpO2: 97% 98% 92% 95%  Weight:      Height:        Intake/Output Summary (Last 24 hours) at 06/04/2020 1159 Last data filed at 06/04/2020 0900 Gross per 24 hour  Intake 1321.67 ml  Output 700 ml  Net 621.67 ml   Filed Weights   06/02/2020 0544  Weight: 63 kg    Examination:  General exam: Appears confused and mildly anxious Respiratory system: Clear to auscultation. Respiratory effort normal. Cardiovascular system: S1 & S2 heard, RRR.  Gastrointestinal system: Abdomen is soft Central nervous system: Alert, but incoherent Extremities: No edema Skin: No significant lesions noted Psychiatry: Flat affect.    Data Reviewed: I have personally reviewed following labs and imaging studies  CBC: Recent Labs  Lab 05/29/20 0436 05/20/2020 0605 06/04/20 0533  WBC 12.2* 8.7 9.1  NEUTROABS 9.1* 5.8  --   HGB 8.5* 8.9* 9.0*  HCT 26.2* 27.4* 27.6*  MCV 94.6 96.1 96.5  PLT 250 214 470   Basic Metabolic Panel: Recent Labs  Lab 05/29/20 0436 06/02/2020 0605 06/04/20 0533  NA 143 145 147*  K 3.6 3.5 3.4*  CL 109 113* 114*  CO2 $Re'26 23 25  'qrr$ GLUCOSE 121* 106* 102*  BUN 24*  39* 29*  CREATININE 0.77 1.39* 1.01*  CALCIUM 7.9* 7.7* 7.8*  MG  --   --  2.2   GFR: Estimated Creatinine Clearance: 52.8 mL/min (A) (by C-G formula based on SCr of 1.01 mg/dL (H)). Liver Function Tests: Recent Labs  Lab 05/29/20 0436 05/29/2020 0605 06/04/20 0533  AST 141* 196* 180*  ALT 35 46* 39  ALKPHOS 162* 160* 155*  BILITOT 3.1* 2.5* 2.6*  PROT 5.1* 4.9* 5.2*  ALBUMIN 2.1* 2.1* 2.3*   Recent Labs  Lab 06/11/2020 0605  LIPASE 111*   Recent Labs  Lab 05/29/20 0437 05/23/2020 0702 06/04/20 0533  AMMONIA 29 72* 55*   Coagulation Profile: Recent Labs  Lab 05/29/20 0436  INR 2.1*   Cardiac Enzymes: No results for input(s): CKTOTAL, CKMB, CKMBINDEX, TROPONINI in the last 168 hours. BNP (last 3 results) No results for input(s): PROBNP in the last 8760 hours. HbA1C: No results for input(s): HGBA1C in the last 72 hours. CBG: No results for input(s): GLUCAP in the last 168 hours. Lipid Profile: No results for input(s): CHOL, HDL, LDLCALC, TRIG, CHOLHDL, LDLDIRECT  in the last 72 hours. Thyroid Function Tests: No results for input(s): TSH, T4TOTAL, FREET4, T3FREE, THYROIDAB in the last 72 hours. Anemia Panel: No results for input(s): VITAMINB12, FOLATE, FERRITIN, TIBC, IRON, RETICCTPCT in the last 72 hours. Sepsis Labs: Recent Labs  Lab 06/05/2020 4332 06/02/2020 0815 06/04/20 0533  LATICACIDVEN 1.6 2.0* 1.9    Recent Results (from the past 240 hour(s))  Culture, blood (routine x 2)     Status: None (Preliminary result)   Collection Time: 05/19/2020  8:12 AM   Specimen: Cath Site; Blood  Result Value Ref Range Status   Specimen Description CATH SITE BOTTLES DRAWN AEROBIC AND ANAEROBIC  Final   Special Requests Blood Culture adequate volume  Final   Culture   Final    NO GROWTH < 24 HOURS Performed at [facility], [address], [place], [place] [address]    Report Status PENDING  Incomplete  Culture, blood (routine x 2)     Status: None (Preliminary result)    Collection Time: 05/16/2020  8:15 AM   Specimen: Left Antecubital; Blood  Result Value Ref Range Status   Specimen Description   Final    LEFT ANTECUBITAL BOTTLES DRAWN AEROBIC AND ANAEROBIC   Special Requests Blood Culture adequate volume  Final   Culture   Final    NO GROWTH < 24 HOURS Performed at [facility], [address], [place], [place] [address]    Report Status PENDING  Incomplete  SARS CORONAVIRUS 2 (TAT 6-24 HRS) Nasopharyngeal Nasopharyngeal Swab     Status: None   Collection Time: 05/24/2020  8:48 AM   Specimen: Nasopharyngeal Swab  Result Value Ref Range Status   SARS Coronavirus 2 NEGATIVE NEGATIVE Final    Comment: (NOTE) SARS-CoV-2 target nucleic acids are NOT DETECTED.  The SARS-CoV-2 RNA is generally detectable in upper and lower respiratory specimens during the acute phase of infection. Negative results do not preclude SARS-CoV-2 infection, do not rule out co-infections with other pathogens, and should not be used as the sole basis for treatment or other patient management decisions. Negative results must be combined with clinical observations, patient history, and epidemiological information. The expected result is Negative.  Fact Sheet for Patients: SugarRoll.be  Fact Sheet for Healthcare Providers: https://www.woods-mathews.com/  This test is not yet approved or cleared by the [place] FDA and  has been authorized for detection and/or diagnosis of SARS-CoV-2 by FDA under an Emergency Use Authorization (EUA). This EUA will remain  in effect (meaning this test can be used) for the duration of the COVID-19 declaration under Se ction 564(b)(1) of the Act, 21 U.S.C. section 360bbb-3(b)(1), unless the authorization is terminated or revoked sooner.  Performed at [facility], [place] [address] [place],  [address]          Radiology Studies: CT Head Wo Contrast  Result Date: 06/11/2020 CLINICAL  DATA:  Head trauma. Found on floor by staff. Baseline altered mental status. EXAM: CT HEAD WITHOUT CONTRAST TECHNIQUE: Contiguous axial images were obtained from the base of the skull through the vertex without intravenous contrast. COMPARISON:  CT head 05/30/2018 FINDINGS: Brain: No evidence of large-territorial acute infarction. No parenchymal hemorrhage. No mass lesion. No extra-axial collection. No mass effect or midline shift. No hydrocephalus. Basilar cisterns are patent. Vascular: No hyperdense vessel. Skull: No acute fracture or focal lesion. Sinuses/Orbits: Paranasal sinuses and mastoid air cells are clear. The orbits are unremarkable. Other: None. IMPRESSION: No acute intracranial abnormality. Electronically Signed   By:  Iven Finn M.D.   On: 05/13/2020 06:40   DG Chest Port 1 View  Result Date: 05/26/2020 CLINICAL DATA:  55 year old female found down.  Weakness. EXAM: PORTABLE CHEST 1 VIEW COMPARISON:  Portable chest 05/24/2020 and earlier. Modified barium swallow 05/25/2020. FINDINGS: Residual barium mixed with retained stool in the upper abdominal large bowel. Continued low lung volumes. Stable right PICC line. Mediastinal contours remain normal. Diffuse coarse bilateral pulmonary opacity appears to be chronic and stable over this year's series of exams. No superimposed pneumothorax, pleural effusion or confluent pulmonary opacity. Visualized tracheal air column is within normal limits. No acute osseous abnormality identified. IMPRESSION: 1. Continued low lung volumes and coarse bilateral pulmonary interstitial markings which do not appear significantly changed since January. Favor a chronic interstitial lung disease over an acute pulmonary process. 2. PICC line remains in place. 3. Residual barium from a modified swallow 9 days ago mixed with residual stool in the colon. Electronically Signed   By: Genevie Ann M.D.   On: 05/17/2020 07:27        Scheduled Meds: . Chlorhexidine Gluconate  Cloth  6 each Topical Daily  . folic acid  1 mg Oral Daily  . lactulose  20 g Oral TID  . lamoTRIgine  150 mg Oral BID  . levETIRAcetam  1,000 mg Oral BID  . octreotide  25 mcg Intravenous Once  . [START ON 06/07/2020] pantoprazole  40 mg Intravenous Q12H   Continuous Infusions: . ceFAZolin 2 g (06/04/20 9191)  . lactated ringers    . octreotide  (SANDOSTATIN)    IV infusion    . pantoprozole (PROTONIX) infusion    . pantoprazole (PROTONIX) 80 mg IVPB    . potassium chloride       LOS: 1 day    Time spent: 35 minutes    Evalisse Prajapati Darleen Crocker, DO [facility]  If 7PM-7AM, please contact night-coverage [email or web address] 06/04/2020, 11:59 AM

## 2020-06-04 NOTE — Progress Notes (Signed)
Consent obtained for EGD via telephone by 2 RN's by niece Estill Bamberg since patient still has periods of encephalopathy.

## 2020-06-04 NOTE — Consult Note (Signed)
Consultation Note Date: 06/04/2020   Patient Name: [name]  DOB: Feb 04, 1965  MRN: [id number]  Age / Sex: 55 y.o., female  PCP: Caprice Renshaw, MD Referring Physician: Rodena Goldmann, DO  Reason for Consultation: Establishing goals of care and Psychosocial/spiritual support  HPI/Patient Profile: 55 y.o. female  with past medical history of COPD, meningioma, cardiac arrest December 2020, hepatic steatosis, PNES, cognitive impairment, depression, complex partial seizure, 4 hospital visits in 6 months, recent admit to short-term rehab admitted on 06/02/2020 with acute hepatic encephalopathy, hematemesis.  Severe sepsis due to MSSA bacteremia discharged 5/15 receiving Ancef through PICC line until 5/29.  Clinical Assessment and Goals of Care: I have reviewed medical records including EPIC notes, labs and imaging, received report from RN, assessed the patient.  [name], [name], is lying quietly in bed.  She looks acutely/chronically ill and quite frail, older than stated age.  She will answer my questions appropriately, but keeps her eyes closed during our conversation.  She tells me that, "the light hurts my eyes", although it is quite dark in the room.  She is alert and oriented x3, and I believe able to make her basic needs known.  There is no family at bedside at this time.  I briefly ask about her cardiac arrest in December 2020.  She tells me that she does not remember what happened, but that she would not want life support.   She tells me she has a child, but is unable to give more information.  Call to niece, [name] who tells me that she is on the way to the hospital and prefers a face-to-face visit. [name] has been transferred to intensive care.  I visit with mother, [name], and niece, [name]. to discuss diagnosis prognosis, GOC, EOL wishes, disposition and options.  I introduced Palliative  Medicine as specialized medical care for people living with serious illness. It focuses on providing relief from the symptoms and stress of a serious illness. The goal is to improve quality of life for both the patient and the family.  We talked about serous acute health concerns including, but not limited to, fatty liver disease, ammonia levels, esophageal varices and need for EGD.  Family seems knowledgeable and accepting of [name]'s illness and declines, remaining hopeful.   We discussed a brief life review of the patient and then focused on their current illness. The natural disease trajectory and expectations at EOL were discussed.    Advanced directives, concepts specific to code status, artifical feeding and hydration, and rehospitalization were considered and discussed.  Mother, [name], seems very knowledgeable about serous health concerns and medical treatment in general.  She tells me that her husband died with staph infection, and even though it is difficult, she sees that Altie may not survive.  She endorses DNR/"treat the treatable but allowing natural death".  Discussed the importance of continued conversation with family and the medical providers regarding overall plan of care and treatment options, ensuring decisions are within the context of the patient's  values and GOCs.    Questions and concerns were addressed. The family was encouraged to call with questions or concerns.  PMT will continue to support holistically.  Conference with attending, bedside nursing staff, and [facility] team related to patient condition, needs, code status.    HCPOA    NEXT OF KIN -mother, [name], is primary decision maker with the input of serous son, Roderic Palau and serous 2 brothers and niece.    SUMMARY OF RECOMMENDATIONS   Continue to treat the treatable but no CPR or intubation Time for outcomes Agreeable for EGD when stable  Code Status/Advance Care Planning:  DNR  Symptom Management:   Per  hospitalist, no additional needs at this time.   Palliative Prophylaxis:   Frequent Pain Assessment, Oral Care and Turn Reposition  Additional Recommendations (Limitations, Scope, Preferences):  treat the treatable, but no CPR or intubation   Psycho-social/Spiritual:   Desire for further Chaplaincy support:no  Additional Recommendations: Caregiving  Support/Resources and Education on Hospice  Prognosis:   Unable to determine, based on outcomes, guarded at this time.   Discharge Planning: to be deterimned, based on outcomes.       Primary Diagnoses: Present on Admission: . Acute hepatic encephalopathy   I have reviewed the medical record, interviewed the patient and family, and examined the patient. The following aspects are pertinent.  Past Medical History:  Diagnosis Date  . Arthritis    "knees" (04/22/2012)  . Brain tumor (benign) ([place])   . Chronic bronchitis ([place])    "yearly; when the weather changes" (04/22/2012)  . Colon polyp   . Colon polyps    adenomatous and hyperplastic-  . Depression   . Eczema   . Epilepsy ([place])    "been having them right often here lately" (04/22/2012)  . Fatty liver   . High cholesterol   . History of kidney stones   . Osteoarthritis    [name] 04/22/2012  . Other convulsions 05/21/12   non-epileptic spells  . Restless leg   . Seizures ([place])   . Vertigo    Social History   Socioeconomic History  . Marital status: Widowed    Spouse name: [name]  . Number of children: 2  . Years of education: 12 +  . Highest education level: Not on file  Occupational History  . Occupation: disability, medical  Tobacco Use  . Smoking status: Never Smoker  . Smokeless tobacco: Never Used  Vaping Use  . Vaping Use: Never used  Substance and Sexual Activity  . Alcohol use: No    Alcohol/week: 0.0 standard drinks    Comment: 12-25-2015 per pt no  . Drug use: No    Comment: 21-12-2015 per pt no   . Sexual activity: Not Currently    Birth  control/protection: Surgical  Other Topics Concern  . Not on file  Social History Narrative   Patient has two adult children.   Patient is disabled.   Patient has a high school education and some trade school.   Patient is right-handed.   Patient drinks four glasses of soda and tea daily.   Social Determinants of Health   Financial Resource Strain: Not on file  Food Insecurity: No Food Insecurity  . Worried About Charity fundraiser in the Last Year: Never true  . Ran Out of Food in the Last Year: Never true  Transportation Needs: No Transportation Needs  . Lack of Transportation (Medical): No  . Lack of Transportation (Non-Medical): No  Physical Activity: Not  on file  Stress: Not on file  Social Connections: Not on file   Family History  Problem Relation Age of Onset  . Cancer Mother   . Depression Father   . Cancer Brother   . Diabetes Brother   . Cancer Maternal Aunt   . Diabetes Maternal Aunt   . Bipolar disorder Son   . Drug abuse Son   . Heart attack Sister 72       during severe illness   Scheduled Meds: . Chlorhexidine Gluconate Cloth  6 each Topical Daily  . folic acid  1 mg Oral Daily  . lactulose  20 g Oral TID  . lamoTRIgine  150 mg Oral BID  . levETIRAcetam  1,000 mg Oral BID  . octreotide  25 mcg Intravenous Once  . [START ON 06/07/2020] pantoprazole  40 mg Intravenous Q12H   Continuous Infusions: . ceFAZolin 2 g (06/04/20 5997)  . lactated ringers 100 mL/hr at 06/04/20 1204  . octreotide  (SANDOSTATIN)    IV infusion    . pantoprozole (PROTONIX) infusion    . pantoprazole (PROTONIX) 80 mg IVPB 80 mg (06/04/20 1239)  . potassium chloride 10 mEq (06/04/20 1230)   PRN Meds:.food thickener, LORazepam, ondansetron **OR** ondansetron (ZOFRAN) IV Medications Prior to Admission:  Prior to Admission medications   Medication Sig Start Date End Date Taking? Authorizing Provider  ceFAZolin (ANCEF) IVPB Inject 2 g into the vein every 8 (eight) hours for 24  days. Indication:  MSSA bacteremia  First Dose: Yes Last Day of Therapy:  Jun 10, 2020 Labs - Once weekly:  CBC/D and BMP, Labs - Every other week:  ESR and CRP Method of administration: IV Push Method of administration may be changed at the discretion of home infusion pharmacist based upon assessment of the patient and/or caregiver's ability to self-administer the medication ordered. 05/18/20 06/11/20 Yes Tat, Shanon Brow, MD  divalproex (DEPAKOTE) 125 MG DR tablet Take 125 mg by mouth 2 (two) times daily.   Yes [provider]  FLUoxetine (PROZAC) 40 MG capsule Take 2 capsules (80 mg total) by mouth daily. 04/26/20 07/25/20 Yes Hisada, Elie Goody, MD  folic acid (FOLVITE) 1 MG tablet Take 1 tablet (1 mg total) by mouth daily. 02/24/20  Yes Tat, Shanon Brow, MD  furosemide (LASIX) 40 MG tablet Take 1 tablet (40 mg total) by mouth daily. 05/28/20  Yes Barton Dubois, MD  lactulose (CHRONULAC) 10 GM/15ML solution Take 30 mLs (20 g total) by mouth 3 (three) times daily. 05/27/20  Yes Barton Dubois, MD  lamoTRIgine (LAMICTAL) 150 MG tablet Take 1 tablet (150 mg total) by mouth 2 (two) times daily. For mood control Patient taking differently: Take 150 mg by mouth 2 (two) times daily. 12/26/19  Yes Vaslow, Acey Lav, MD  levETIRAcetam (KEPPRA) 500 MG tablet Take 500 mg by mouth 2 (two) times daily.   Yes [provider]  LORazepam (ATIVAN) 1 MG tablet Take 1 tablet (1 mg total) by mouth daily as needed for anxiety. Patient taking differently: Take 1 mg by mouth every 6 (six) hours as needed for anxiety. 03/26/20  Yes Hisada, Elie Goody, MD  LORazepam (ATIVAN) 2 MG/ML concentrated solution Take 1 mg by mouth daily as needed for anxiety.   Yes [provider]  metoprolol tartrate (LOPRESSOR) 25 MG tablet Take 0.5 tablets (12.5 mg total) by mouth 2 (two) times daily. 05/21/20  Yes Tat, Shanon Brow, MD  senna-docusate (SENOKOT-S) 8.6-50 MG tablet Take 1 tablet by mouth 2 (two) times daily. 05/27/20  Yes Barton Dubois, MD  tamsulosin (FLOMAX) 0.4 MG CAPS capsule Take 0.4 mg by mouth daily.   Yes [provider]  food thickener (SIMPLYTHICK, NECTAR/LEVEL 2/MILDLY THICK,) GEL Take 1 packet by mouth as needed (as needed for levetiracetam solution administration). Patient not taking: No sig reported 05/27/20   Barton Dubois, MD  levETIRAcetam (KEPPRA) 100 MG/ML solution Take 10 mLs (1,000 mg total) by mouth 2 (two) times daily. Patient not taking: No sig reported 05/27/20   Barton Dubois, MD   Allergies  Allergen Reactions  . Acetaminophen Other (See Comments)    Has fatty deposits on liver  . Benadryl [Diphenhydramine Hcl] Other (See Comments)    hyperactivity and seizures  . Codeine Itching and Rash    seizures  . Dilantin [Phenytoin Sodium Extended] Other (See Comments)    Elevated LFT's  . Melatonin Other (See Comments)    seizures  . Tramadol Other (See Comments)    "causes seizures"  . Ultram [Tramadol Hcl] Other (See Comments)    Seizures  . Vimpat [Lacosamide] Other (See Comments)    Severe dizziness  . Mirtazapine Other (See Comments)    Potentiated seizure activity  . Betadine [Povidone Iodine] Rash  . Latex Rash  . Penicillins Itching and Rash    Patient can tolerate cephalosporins  . Sulfa Antibiotics Rash  . Tape Rash and Other (See Comments)    Paper tape please Paper tape please  . Vicodin [Hydrocodone-Acetaminophen] Itching   Review of Systems  Unable to perform ROS: Acuity of condition    Physical Exam Vitals and nursing note reviewed.  Constitutional:      General: She is not in acute distress.    Appearance: She is ill-appearing.     Comments: Appears acutely/chronically ill and quite frail, older than stated age  HENT:     Mouth/Throat:     Mouth: Mucous membranes are dry.  Cardiovascular:     Rate and Rhythm: Normal rate.  Pulmonary:     Effort: Pulmonary effort is normal. No respiratory distress.  Abdominal:     General: Abdomen is flat.   Skin:    General: Skin is warm and dry.  Neurological:     Mental Status: She is oriented to person, place, and time.     Comments: Keeps eyes closed through conversation  Psychiatric:     Comments: Cooperative     Vital Signs: BP 128/85   Pulse 90   Temp 98 F (36.7 C) (Oral)   Resp 18   Ht 5' (1.524 m)   Wt 63 kg   SpO2 95%   BMI 27.13 kg/m  Pain Scale: 0-10   Pain Score: 0-No pain   SpO2: SpO2: 95 % O2 Device:SpO2: 95 % O2 Flow Rate: .   IO: Intake/output summary:   Intake/Output Summary (Last 24 hours) at 06/04/2020 1252 Last data filed at 06/04/2020 0900 Gross per 24 hour  Intake 1321.67 ml  Output 700 ml  Net 621.67 ml    LBM: Last BM Date: 06/02/2020 Baseline Weight: Weight: 63 kg Most recent weight: Weight: 63 kg     Palliative Assessment/Data:   Flowsheet Rows   Flowsheet Row Most Recent Value  Intake Tab   Referral Department Hospitalist  Unit at Time of Referral Intermediate Care Unit  Palliative Care Primary Diagnosis Sepsis/Infectious Disease  Date Notified 06/02/2020  Palliative Care Type New Palliative care  Reason for referral Clarify Goals of Care  Date of Admission 05/23/2020  Date first  seen by Palliative Care 06/04/20  # of days Palliative referral response time 1 Day(s)  # of days IP prior to Palliative referral 0  Clinical Assessment   Palliative Performance Scale Score 30%  Pain Max last 24 hours Not able to report  Pain Min Last 24 hours Not able to report  Dyspnea Max Last 24 Hours Not able to report  Dyspnea Min Last 24 hours Not able to report  Psychosocial & Spiritual Assessment   Palliative Care Outcomes       Time In: 1120 Time Out: 1230  Time Total: 70 minutes  Greater than 50%  of this time was spent counseling and coordinating care related to the above assessment and plan.  Signed by: Drue Novel, NP   Please contact Palliative Medicine Team phone at [phone] for questions and concerns.  For individual provider:  See Shea Evans

## 2020-06-04 NOTE — Plan of Care (Signed)

## 2020-06-04 NOTE — Consult Note (Addendum)
Referring Provider: Dr. Manuella Ghazi  Primary Care Physician:  Caprice Renshaw, MD Primary Gastroenterologist:  Dr. Thornton Park, L[facility]I  Date of Admission: 05/20/2020 Date of Consultation: 06/04/20  Reason for Consultation:  Hematemesis, history of cirrhosis   HPI:  [name] is a 55 y.o. year old female with history of skull base hemangioma, seizures, PNES, COPD, recently inpatient for severe sepsis due to MSSA bacteremia and discharged 5/15 receiving Ancef through PICC line, cirrhosis most likely due to fatty liver and had actually established care with Dr. Tarri Glenn in March 2022, CT abd/pelvis with contrast April 2022 with cirrhosis, paraesophageal and gastric varices. Presented to the ED yesterday after being found down at skilled nursing facility. Confused on admission. CT head without acute intracranial abnormality. Admitted with acute hepatic encephalopathy, acute renal injury, urinary retention, now with acute vomiting of bright red blood this morning, moderate amount. GI now consulted.  Unable to obtain a history from patient. She is in no distress but keeps eyes closed and unintelligible noises. Nursing at bedside. Reports moderate amount of bright red emesis this morning. No melena. In process of transferring down to ICU. Octreotide and Protonix infusion have been ordered already. She has been NPO except sips of meds. Upon review of chart, she has had history of falls, most recently in May 2022 as well. During admission for MSSA bacteremia, she required 1 unit PRBCs as Hgb dropped to 6.8 during admission, improving to 8.5 at discharge. Admitting Hgb yesterday 8.9. Last INR on 5/17 2.1 and has been reordered. Hgb early this morning 9. Awaiting repeat Hgb and INR. Per nursing staff, she was conversant this morning and now has had decline.   I spoke with patient's niece who states she lived alone prior to admission in May 2022. Now with marked decline since that admission. Worsening appetite over  past 6 months.   Prior cirrhosis evaluation: acute hepatitis panel negative in May 2022. ANA positive in March 2022. No evidence for autoimmune hepatitis. AMA negative.   Endoscopic history per Dr. Payton Emerald note:  - Colonoscopy 08/11/2007 revealed 3 diminutive colon polyps, 2 of which were adenomatous. Followup in 5 years recommended.   - Colonoscopy 06/23/2012: revealed 2 diminutive colon polyps, 1 tubular adenoma and 1 benign colonic mucosa. Surveillance recommended in 5 years.  - EGD 06/23/2012: gastritis, CLO negative   Past Medical History:  Diagnosis Date  . Arthritis    "knees" (04/22/2012)  . Brain tumor (benign) ([place])   . Chronic bronchitis ([place])    "yearly; when the weather changes" (04/22/2012)  . Colon polyp   . Colon polyps    adenomatous and hyperplastic-  . Depression   . Eczema   . Epilepsy ([place])    "been having them right often here lately" (04/22/2012)  . Fatty liver   . High cholesterol   . History of kidney stones   . Osteoarthritis    [name] 04/22/2012  . Other convulsions 05/21/12   non-epileptic spells  . Restless leg   . Seizures ([place])   . Vertigo     Past Surgical History:  Procedure Laterality Date  . ABDOMINAL HYSTERECTOMY  2001  . BLADDER SUSPENSION    . BUNIONECTOMY Left 2000  . CESAREAN SECTION  1987; 1988  . COLONOSCOPY    . JOINT REPLACEMENT    . MASS EXCISION  10/22/2011   Procedure: EXCISION MASS;  Surgeon: [name], MD;  Location: [facility];  Service: General;  Laterality: Right;  excision right buttock mass  .  TEE WITHOUT CARDIOVERSION N/A 05/16/2020   Procedure: TRANSESOPHAGEAL ECHOCARDIOGRAM (TEE) WITH PROPOFOL;  Surgeon: Arnoldo Lenis, MD;  Location: [facility];  Service: Endoscopy;  Laterality: N/A;  . TOTAL HIP ARTHROPLASTY Left 1993; 1995; 2000  . TOTAL KNEE ARTHROPLASTY Left 05/07/2015   Procedure: TOTAL LEFT KNEE ARTHROPLASTY;  Surgeon: [name], MD;  Location: [facility];  Service: Orthopedics;  Laterality: Left;  . TOTAL  KNEE ARTHROPLASTY Right 10/01/2015   Procedure: RIGHT TOTAL KNEE ARTHROPLASTY;  Surgeon: [name], MD;  Location: [facility];  Service: Orthopedics;  Laterality: Right;    Prior to Admission medications   Medication Sig Start Date End Date Taking? Authorizing Provider  ceFAZolin (ANCEF) IVPB Inject 2 g into the vein every 8 (eight) hours for 24 days. Indication:  MSSA bacteremia  First Dose: Yes Last Day of Therapy:  Jun 10, 2020 Labs - Once weekly:  CBC/D and BMP, Labs - Every other week:  ESR and CRP Method of administration: IV Push Method of administration may be changed at the discretion of home infusion pharmacist based upon assessment of the patient and/or caregiver's ability to self-administer the medication ordered. 05/18/20 06/11/20 Yes Tat, Shanon Brow, MD  divalproex (DEPAKOTE) 125 MG DR tablet Take 125 mg by mouth 2 (two) times daily.   Yes [provider]  FLUoxetine (PROZAC) 40 MG capsule Take 2 capsules (80 mg total) by mouth daily. 04/26/20 07/25/20 Yes Hisada, Elie Goody, MD  folic acid (FOLVITE) 1 MG tablet Take 1 tablet (1 mg total) by mouth daily. 02/24/20  Yes Tat, Shanon Brow, MD  furosemide (LASIX) 40 MG tablet Take 1 tablet (40 mg total) by mouth daily. 05/28/20  Yes Barton Dubois, MD  lactulose (CHRONULAC) 10 GM/15ML solution Take 30 mLs (20 g total) by mouth 3 (three) times daily. 05/27/20  Yes Barton Dubois, MD  lamoTRIgine (LAMICTAL) 150 MG tablet Take 1 tablet (150 mg total) by mouth 2 (two) times daily. For mood control Patient taking differently: Take 150 mg by mouth 2 (two) times daily. 12/26/19  Yes Vaslow, Acey Lav, MD  levETIRAcetam (KEPPRA) 500 MG tablet Take 500 mg by mouth 2 (two) times daily.   Yes [provider]  LORazepam (ATIVAN) 1 MG tablet Take 1 tablet (1 mg total) by mouth daily as needed for anxiety. Patient taking differently: Take 1 mg by mouth every 6 (six) hours as needed for anxiety. 03/26/20  Yes Hisada, Elie Goody, MD  LORazepam (ATIVAN) 2 MG/ML  concentrated solution Take 1 mg by mouth daily as needed for anxiety.   Yes [provider]  metoprolol tartrate (LOPRESSOR) 25 MG tablet Take 0.5 tablets (12.5 mg total) by mouth 2 (two) times daily. 05/21/20  Yes Tat, Shanon Brow, MD  senna-docusate (SENOKOT-S) 8.6-50 MG tablet Take 1 tablet by mouth 2 (two) times daily. 05/27/20  Yes Barton Dubois, MD  tamsulosin (FLOMAX) 0.4 MG CAPS capsule Take 0.4 mg by mouth daily.   Yes [provider]  food thickener (SIMPLYTHICK, NECTAR/LEVEL 2/MILDLY THICK,) GEL Take 1 packet by mouth as needed (as needed for levetiracetam solution administration). Patient not taking: No sig reported 05/27/20   Barton Dubois, MD  levETIRAcetam (KEPPRA) 100 MG/ML solution Take 10 mLs (1,000 mg total) by mouth 2 (two) times daily. Patient not taking: No sig reported 05/27/20   Barton Dubois, MD    Current Facility-Administered Medications  Medication Dose Route Frequency Provider Last Rate Last Admin  . ceFAZolin (ANCEF) IVPB 2g/100 mL premix  2 g Intravenous Q8H Shah, Pratik D, DO  200 mL/hr at 06/04/20 0614 2 g at 06/04/20 2440  . Chlorhexidine Gluconate Cloth 2 % PADS 6 each  6 each Topical Daily Manuella Ghazi, Pratik D, DO   6 each at 06/04/20 1020  . folic acid (FOLVITE) tablet 1 mg  1 mg Oral Daily Manuella Ghazi, Pratik D, DO   1 mg at 06/04/20 1020  . food thickener (SIMPLYTHICK (NECTAR/LEVEL 2/MILDLY THICK)) 1 packet  1 packet Oral PRN Manuella Ghazi, Pratik D, DO      . lactated ringers infusion   Intravenous Continuous Heath Lark D, DO 100 mL/hr at 06/04/20 1204 New Bag at 06/04/20 1204  . lactulose (CHRONULAC) 10 GM/15ML solution 20 g  20 g Oral TID Manuella Ghazi, Pratik D, DO   20 g at 06/04/20 1020  . lamoTRIgine (LAMICTAL) tablet 150 mg  150 mg Oral BID Manuella Ghazi, Pratik D, DO   150 mg at 06/04/20 1020  . levETIRAcetam (KEPPRA) 100 MG/ML solution 1,000 mg  1,000 mg Oral BID Manuella Ghazi, Pratik D, DO   1,000 mg at 06/04/20 1020  . LORazepam (ATIVAN) injection 1 mg  1 mg Intravenous Q4H PRN Manuella Ghazi,  Pratik D, DO   1 mg at 06/09/2020 2158  . octreotide (SANDOSTATIN) 2 mcg/mL load via infusion 25 mcg  25 mcg Intravenous Once Manuella Ghazi, Pratik D, DO       And  . octreotide (SANDOSTATIN) 500 mcg in sodium chloride 0.9 % 250 mL (2 mcg/mL) infusion  50 mcg/hr Intravenous Continuous Shah, Pratik D, DO      . ondansetron (ZOFRAN) tablet 4 mg  4 mg Oral Q6H PRN Manuella Ghazi, Pratik D, DO       Or  . ondansetron (ZOFRAN) injection 4 mg  4 mg Intravenous Q6H PRN Manuella Ghazi, Pratik D, DO      . pantoprazole (PROTONIX) 80 mg in sodium chloride 0.9 % 100 mL (0.8 mg/mL) infusion  8 mg/hr Intravenous Continuous Shah, Pratik D, DO      . pantoprazole (PROTONIX) 80 mg in sodium chloride 0.9 % 100 mL IVPB  80 mg Intravenous Once Shah, Pratik D, DO      . [START ON 06/07/2020] pantoprazole (PROTONIX) injection 40 mg  40 mg Intravenous Q12H Shah, Pratik D, DO      . potassium chloride 10 mEq in 100 mL IVPB  10 mEq Intravenous Q1 Hr x 4 Shah, Pratik D, DO        Allergies as of 05/13/2020 - Review Complete 06/07/2020  Allergen Reaction Noted  . Acetaminophen Other (See Comments) 10/23/2014  . Benadryl [diphenhydramine hcl] Other (See Comments) 04/23/2011  . Codeine Itching and Rash   . Dilantin [phenytoin sodium extended] Other (See Comments) 06/22/2011  . Melatonin Other (See Comments) 04/23/2011  . Tramadol Other (See Comments) 09/03/2014  . Ultram [tramadol hcl] Other (See Comments) 07/15/2010  . Vimpat [lacosamide] Other (See Comments) 04/23/2012  . Mirtazapine Other (See Comments) 12/14/2017  . Betadine [povidone iodine] Rash 11/05/2011  . Latex Rash 07/15/2010  . Penicillins Itching and Rash   . Sulfa antibiotics Rash 11/01/2014  . Tape Rash and Other (See Comments) 10/14/2010  . Vicodin [hydrocodone-acetaminophen] Itching 12/26/2010    Family History  Problem Relation Age of Onset  . Cancer Mother   . Depression Father   . Cancer Brother   . Diabetes Brother   . Cancer Maternal Aunt   . Diabetes Maternal Aunt    . Bipolar disorder Son   . Drug abuse Son   . Heart attack Sister 70  during severe illness    Social History   Socioeconomic History  . Marital status: Widowed    Spouse name: [name]  . Number of children: 2  . Years of education: 12 +  . Highest education level: Not on file  Occupational History  . Occupation: disability, medical  Tobacco Use  . Smoking status: Never Smoker  . Smokeless tobacco: Never Used  Vaping Use  . Vaping Use: Never used  Substance and Sexual Activity  . Alcohol use: No    Alcohol/week: 0.0 standard drinks    Comment: 12-25-2015 per pt no  . Drug use: No    Comment: 21-12-2015 per pt no   . Sexual activity: Not Currently    Birth control/protection: Surgical  Other Topics Concern  . Not on file  Social History Narrative   Patient has two adult children.   Patient is disabled.   Patient has a high school education and some trade school.   Patient is right-handed.   Patient drinks four glasses of soda and tea daily.   Social Determinants of Health   Financial Resource Strain: Not on file  Food Insecurity: No Food Insecurity  . Worried About Charity fundraiser in the Last Year: Never true  . Ran Out of Food in the Last Year: Never true  Transportation Needs: No Transportation Needs  . Lack of Transportation (Medical): No  . Lack of Transportation (Non-Medical): No  Physical Activity: Not on file  Stress: Not on file  Social Connections: Not on file  Intimate Partner Violence: Not on file    Review of Systems: Unable to obtain due to cognitive status   Physical Exam: Vital signs in last 24 hours: Temp:  [97.4 F (36.3 C)-98.3 F (36.8 C)] 98 F (36.7 C) (05/23 0421) Pulse Rate:  [78-90] 90 (05/23 1140) Resp:  [18-20] 18 (05/23 0015) BP: (88-150)/(54-119) 128/85 (05/23 1140) SpO2:  [92 %-100 %] 95 % (05/23 1140) Last BM Date: 05/18/2020 General:  Resting with eyes closed, no distress, unintelligible muttering, does not  follow commands, frail, appears older than stated age Head:  Normocephalic and atraumatic. Lungs:  Clear throughout to auscultation.    Heart:  S1 S2 present, no tachycardia Abdomen:  Protuberant upper abdomen, firm, no TTP, soft lower abdomen, +BS Rectal:  Deferred  Extremities:  With trace pedal and pre-tibial edema lower extremities  Neurologic:  Somnolent.   Intake/Output from previous day: 05/22 0701 - 05/23 0700 In: 1321.7 [I.V.:721.7; IV Piggyback:600] Out: 700 [Urine:700] Intake/Output this shift: No intake/output data recorded.  Lab Results: Recent Labs    06/07/2020 0605 06/04/20 0533  WBC 8.7 9.1  HGB 8.9* 9.0*  HCT 27.4* 27.6*  PLT 214 213   BMET Recent Labs    06/11/2020 0605 06/04/20 0533  NA 145 147*  K 3.5 3.4*  CL 113* 114*  CO2 23 25  GLUCOSE 106* 102*  BUN 39* 29*  CREATININE 1.39* 1.01*  CALCIUM 7.7* 7.8*   LFT Recent Labs    06/02/2020 0605 06/04/20 0533  PROT 4.9* 5.2*  ALBUMIN 2.1* 2.3*  AST 196* 180*  ALT 46* 39  ALKPHOS 160* 155*  BILITOT 2.5* 2.6*  BILIDIR 1.0*  --   IBILI 1.5*  --    Studies/Results: CT Head Wo Contrast  Result Date: 06/07/2020 CLINICAL DATA:  Head trauma. Found on floor by staff. Baseline altered mental status. EXAM: CT HEAD WITHOUT CONTRAST TECHNIQUE: Contiguous axial images were obtained from the base of the skull through  the vertex without intravenous contrast. COMPARISON:  CT head 05/30/2018 FINDINGS: Brain: No evidence of large-territorial acute infarction. No parenchymal hemorrhage. No mass lesion. No extra-axial collection. No mass effect or midline shift. No hydrocephalus. Basilar cisterns are patent. Vascular: No hyperdense vessel. Skull: No acute fracture or focal lesion. Sinuses/Orbits: Paranasal sinuses and mastoid air cells are clear. The orbits are unremarkable. Other: None. IMPRESSION: No acute intracranial abnormality. Electronically Signed   By: Iven Finn M.D.   On: 05/17/2020 06:40   DG Chest  Port 1 View  Result Date: 05/28/2020 CLINICAL DATA:  55 year old female found down.  Weakness. EXAM: PORTABLE CHEST 1 VIEW COMPARISON:  Portable chest 05/24/2020 and earlier. Modified barium swallow 05/25/2020. FINDINGS: Residual barium mixed with retained stool in the upper abdominal large bowel. Continued low lung volumes. Stable right PICC line. Mediastinal contours remain normal. Diffuse coarse bilateral pulmonary opacity appears to be chronic and stable over this year's series of exams. No superimposed pneumothorax, pleural effusion or confluent pulmonary opacity. Visualized tracheal air column is within normal limits. No acute osseous abnormality identified. IMPRESSION: 1. Continued low lung volumes and coarse bilateral pulmonary interstitial markings which do not appear significantly changed since January. Favor a chronic interstitial lung disease over an acute pulmonary process. 2. PICC line remains in place. 3. Residual barium from a modified swallow 9 days ago mixed with residual stool in the colon. Electronically Signed   By: Genevie Ann M.D.   On: 05/25/2020 07:27    Impression:  55 y.o. year old female resident of [facility] with history of skull base hemangioma, seizures, PNES, COPD, recently inpatient for severe sepsis due to MSSA bacteremia and discharged 5/15 receiving Ancef through PICC line, cirrhosis most likely due to fatty liver and had actually established care with Dr. Tarri Glenn in March 2022, CT abd/pelvis with contrast April 2022 with cirrhosis, paraesophageal and gastric varices, presenting to the ED yesterday with acute hepatic encephalopathy and had been found down. Admitted with HE, acute renal injury, urinary retention, and now with acute episode of moderate volume bright red blood emesis.   On exam, patient is unable to provide history due to mental status changes. Somnolent on exam; per nursing, she had been conversant this morning prior to vomiting. Repeat H/H is  pending, and H/H on admission yesterday was 9. INR also ordered, as last was in the low 2 range during prior admission.   Cirrhosis: likely due to fatty liver but evaluation is in process per Dr. Tarri Glenn. Known paraesophageal and gastric varices on CT. Likely tense ascites on exam and will need further evaluation of this after acute UGI bleed assessed.   Acute UGI bleed: concern for variceal in light of clinical scenario. Appreciate hospitalist already placing orders for Octreotide and Protonix infusion. She is on Ancef due to MSSA bacteremia; reviewed with Dr. Manuella Ghazi. We are also adding ceftriaxone due to GI bleeding in known cirrhosis. Last EGD in 2014 with gastritis.   Hepatic encephalopathy: will transition to lactulose enemas as she is unsafe to take orally now. Would benefit from adding Xifaxan in future once stabilized.  Elevated LFTs: similar to prior as outpatient.   I contacted niece, who is listed as contact person on chart. Discussed with her clinical scenario. Discussed need for EGD when stabilized. She is aware of risks and benefits. Niece and son will be coming to hospital shortly.   Plan: Remain NPO H/H now INR stat now Start octreotide and Protonix infusion Add Rocephin Will be pursuing  EGD once stabilized Will need [name] para after acute GI bleeding addressed   Annitta Needs, PhD, ANP-BC [facility]      LOS: 1 day    06/04/2020, 12:29 PM

## 2020-06-04 NOTE — Progress Notes (Addendum)
Patient requires frequent re-positioning of the body in ways that cannot be achieved with an ordinary bed or wedge pillow, to eliminate pain, reduce pressure, and the head of the bed to be elevated more than 30 degrees most of the time due to chronic pain, liver cirrhosis, pressure injury.

## 2020-06-04 NOTE — H&P (View-Only) (Signed)
Referring Provider: Dr. Manuella Ghazi  Primary Care Physician:  Caprice Renshaw, MD Primary Gastroenterologist:  Dr. Thornton Park, LBGI  Date of Admission: 05/20/2020 Date of Consultation: 06/04/20  Reason for Consultation:  Hematemesis, history of cirrhosis   HPI:  [name] is a 55 y.o. year old female with history of skull base hemangioma, seizures, PNES, COPD, recently inpatient for severe sepsis due to MSSA bacteremia and discharged 5/15 receiving Ancef through PICC line, cirrhosis most likely due to fatty liver and had actually established care with Dr. Tarri Glenn in March 2022, CT abd/pelvis with contrast April 2022 with cirrhosis, paraesophageal and gastric varices. Presented to the ED yesterday after being found down at skilled nursing facility. Confused on admission. CT head without acute intracranial abnormality. Admitted with acute hepatic encephalopathy, acute renal injury, urinary retention, now with acute vomiting of bright red blood this morning, moderate amount. GI now consulted.  Unable to obtain a history from patient. She is in no distress but keeps eyes closed and unintelligible noises. Nursing at bedside. Reports moderate amount of bright red emesis this morning. No melena. In process of transferring down to ICU. Octreotide and Protonix infusion have been ordered already. She has been NPO except sips of meds. Upon review of chart, she has had history of falls, most recently in May 2022 as well. During admission for MSSA bacteremia, she required 1 unit PRBCs as Hgb dropped to 6.8 during admission, improving to 8.5 at discharge. Admitting Hgb yesterday 8.9. Last INR on 5/17 2.1 and has been reordered. Hgb early this morning 9. Awaiting repeat Hgb and INR. Per nursing staff, she was conversant this morning and now has had decline.   I spoke with patient's niece who states she lived alone prior to admission in May 2022. Now with marked decline since that admission. Worsening appetite over  past 6 months.   Prior cirrhosis evaluation: acute hepatitis panel negative in May 2022. ANA positive in March 2022. No evidence for autoimmune hepatitis. AMA negative.   Endoscopic history per Dr. Payton Emerald note:  - Colonoscopy 08/11/2007 revealed 3 diminutive colon polyps, 2 of which were adenomatous. Followup in 5 years recommended.   - Colonoscopy 06/23/2012: revealed 2 diminutive colon polyps, 1 tubular adenoma and 1 benign colonic mucosa. Surveillance recommended in 5 years.  - EGD 06/23/2012: gastritis, CLO negative   Past Medical History:  Diagnosis Date  . Arthritis    "knees" (04/22/2012)  . Brain tumor (benign) ([place])   . Chronic bronchitis ([place])    "yearly; when the weather changes" (04/22/2012)  . Colon polyp   . Colon polyps    adenomatous and hyperplastic-  . Depression   . Eczema   . Epilepsy ([place])    "been having them right often here lately" (04/22/2012)  . Fatty liver   . High cholesterol   . History of kidney stones   . Osteoarthritis    [name] 04/22/2012  . Other convulsions 05/21/12   non-epileptic spells  . Restless leg   . Seizures ([place])   . Vertigo     Past Surgical History:  Procedure Laterality Date  . ABDOMINAL HYSTERECTOMY  2001  . BLADDER SUSPENSION    . BUNIONECTOMY Left 2000  . CESAREAN SECTION  1987; 1988  . COLONOSCOPY    . JOINT REPLACEMENT    . MASS EXCISION  10/22/2011   Procedure: EXCISION MASS;  Surgeon: [name], MD;  Location: [facility];  Service: General;  Laterality: Right;  excision right buttock mass  .  TEE WITHOUT CARDIOVERSION N/A 05/16/2020   Procedure: TRANSESOPHAGEAL ECHOCARDIOGRAM (TEE) WITH PROPOFOL;  Surgeon: Arnoldo Lenis, MD;  Location: [facility];  Service: Endoscopy;  Laterality: N/A;  . TOTAL HIP ARTHROPLASTY Left 1993; 1995; 2000  . TOTAL KNEE ARTHROPLASTY Left 05/07/2015   Procedure: TOTAL LEFT KNEE ARTHROPLASTY;  Surgeon: [name], MD;  Location: [facility];  Service: Orthopedics;  Laterality: Left;  . TOTAL  KNEE ARTHROPLASTY Right 10/01/2015   Procedure: RIGHT TOTAL KNEE ARTHROPLASTY;  Surgeon: [name], MD;  Location: [facility];  Service: Orthopedics;  Laterality: Right;    Prior to Admission medications   Medication Sig Start Date End Date Taking? Authorizing Provider  ceFAZolin (ANCEF) IVPB Inject 2 g into the vein every 8 (eight) hours for 24 days. Indication:  MSSA bacteremia  First Dose: Yes Last Day of Therapy:  Jun 10, 2020 Labs - Once weekly:  CBC/D and BMP, Labs - Every other week:  ESR and CRP Method of administration: IV Push Method of administration may be changed at the discretion of home infusion pharmacist based upon assessment of the patient and/or caregiver's ability to self-administer the medication ordered. 05/18/20 06/11/20 Yes Tat, Shanon Brow, MD  divalproex (DEPAKOTE) 125 MG DR tablet Take 125 mg by mouth 2 (two) times daily.   Yes [provider]  FLUoxetine (PROZAC) 40 MG capsule Take 2 capsules (80 mg total) by mouth daily. 04/26/20 07/25/20 Yes Hisada, Elie Goody, MD  folic acid (FOLVITE) 1 MG tablet Take 1 tablet (1 mg total) by mouth daily. 02/24/20  Yes Tat, Shanon Brow, MD  furosemide (LASIX) 40 MG tablet Take 1 tablet (40 mg total) by mouth daily. 05/28/20  Yes Barton Dubois, MD  lactulose (CHRONULAC) 10 GM/15ML solution Take 30 mLs (20 g total) by mouth 3 (three) times daily. 05/27/20  Yes Barton Dubois, MD  lamoTRIgine (LAMICTAL) 150 MG tablet Take 1 tablet (150 mg total) by mouth 2 (two) times daily. For mood control Patient taking differently: Take 150 mg by mouth 2 (two) times daily. 12/26/19  Yes Vaslow, Acey Lav, MD  levETIRAcetam (KEPPRA) 500 MG tablet Take 500 mg by mouth 2 (two) times daily.   Yes [provider]  LORazepam (ATIVAN) 1 MG tablet Take 1 tablet (1 mg total) by mouth daily as needed for anxiety. Patient taking differently: Take 1 mg by mouth every 6 (six) hours as needed for anxiety. 03/26/20  Yes Hisada, Elie Goody, MD  LORazepam (ATIVAN) 2 MG/ML  concentrated solution Take 1 mg by mouth daily as needed for anxiety.   Yes [provider]  metoprolol tartrate (LOPRESSOR) 25 MG tablet Take 0.5 tablets (12.5 mg total) by mouth 2 (two) times daily. 05/21/20  Yes Tat, Shanon Brow, MD  senna-docusate (SENOKOT-S) 8.6-50 MG tablet Take 1 tablet by mouth 2 (two) times daily. 05/27/20  Yes Barton Dubois, MD  tamsulosin (FLOMAX) 0.4 MG CAPS capsule Take 0.4 mg by mouth daily.   Yes [provider]  food thickener (SIMPLYTHICK, NECTAR/LEVEL 2/MILDLY THICK,) GEL Take 1 packet by mouth as needed (as needed for levetiracetam solution administration). Patient not taking: No sig reported 05/27/20   Barton Dubois, MD  levETIRAcetam (KEPPRA) 100 MG/ML solution Take 10 mLs (1,000 mg total) by mouth 2 (two) times daily. Patient not taking: No sig reported 05/27/20   Barton Dubois, MD    Current Facility-Administered Medications  Medication Dose Route Frequency Provider Last Rate Last Admin  . ceFAZolin (ANCEF) IVPB 2g/100 mL premix  2 g Intravenous Q8H Shah, Pratik D, DO  200 mL/hr at 06/04/20 0614 2 g at 06/04/20 2440  . Chlorhexidine Gluconate Cloth 2 % PADS 6 each  6 each Topical Daily Manuella Ghazi, Pratik D, DO   6 each at 06/04/20 1020  . folic acid (FOLVITE) tablet 1 mg  1 mg Oral Daily Manuella Ghazi, Pratik D, DO   1 mg at 06/04/20 1020  . food thickener (SIMPLYTHICK (NECTAR/LEVEL 2/MILDLY THICK)) 1 packet  1 packet Oral PRN Manuella Ghazi, Pratik D, DO      . lactated ringers infusion   Intravenous Continuous Heath Lark D, DO 100 mL/hr at 06/04/20 1204 New Bag at 06/04/20 1204  . lactulose (CHRONULAC) 10 GM/15ML solution 20 g  20 g Oral TID Manuella Ghazi, Pratik D, DO   20 g at 06/04/20 1020  . lamoTRIgine (LAMICTAL) tablet 150 mg  150 mg Oral BID Manuella Ghazi, Pratik D, DO   150 mg at 06/04/20 1020  . levETIRAcetam (KEPPRA) 100 MG/ML solution 1,000 mg  1,000 mg Oral BID Manuella Ghazi, Pratik D, DO   1,000 mg at 06/04/20 1020  . LORazepam (ATIVAN) injection 1 mg  1 mg Intravenous Q4H PRN Manuella Ghazi,  Pratik D, DO   1 mg at 06/09/2020 2158  . octreotide (SANDOSTATIN) 2 mcg/mL load via infusion 25 mcg  25 mcg Intravenous Once Manuella Ghazi, Pratik D, DO       And  . octreotide (SANDOSTATIN) 500 mcg in sodium chloride 0.9 % 250 mL (2 mcg/mL) infusion  50 mcg/hr Intravenous Continuous Shah, Pratik D, DO      . ondansetron (ZOFRAN) tablet 4 mg  4 mg Oral Q6H PRN Manuella Ghazi, Pratik D, DO       Or  . ondansetron (ZOFRAN) injection 4 mg  4 mg Intravenous Q6H PRN Manuella Ghazi, Pratik D, DO      . pantoprazole (PROTONIX) 80 mg in sodium chloride 0.9 % 100 mL (0.8 mg/mL) infusion  8 mg/hr Intravenous Continuous Shah, Pratik D, DO      . pantoprazole (PROTONIX) 80 mg in sodium chloride 0.9 % 100 mL IVPB  80 mg Intravenous Once Shah, Pratik D, DO      . [START ON 06/07/2020] pantoprazole (PROTONIX) injection 40 mg  40 mg Intravenous Q12H Shah, Pratik D, DO      . potassium chloride 10 mEq in 100 mL IVPB  10 mEq Intravenous Q1 Hr x 4 Shah, Pratik D, DO        Allergies as of 05/26/2020 - Review Complete 06/07/2020  Allergen Reaction Noted  . Acetaminophen Other (See Comments) 10/23/2014  . Benadryl [diphenhydramine hcl] Other (See Comments) 04/23/2011  . Codeine Itching and Rash   . Dilantin [phenytoin sodium extended] Other (See Comments) 06/22/2011  . Melatonin Other (See Comments) 04/23/2011  . Tramadol Other (See Comments) 09/03/2014  . Ultram [tramadol hcl] Other (See Comments) 07/15/2010  . Vimpat [lacosamide] Other (See Comments) 04/23/2012  . Mirtazapine Other (See Comments) 12/14/2017  . Betadine [povidone iodine] Rash 11/05/2011  . Latex Rash 07/15/2010  . Penicillins Itching and Rash   . Sulfa antibiotics Rash 11/01/2014  . Tape Rash and Other (See Comments) 10/14/2010  . Vicodin [hydrocodone-acetaminophen] Itching 12/26/2010    Family History  Problem Relation Age of Onset  . Cancer Mother   . Depression Father   . Cancer Brother   . Diabetes Brother   . Cancer Maternal Aunt   . Diabetes Maternal Aunt    . Bipolar disorder Son   . Drug abuse Son   . Heart attack Sister 70  during severe illness    Social History   Socioeconomic History  . Marital status: Widowed    Spouse name: [name]  . Number of children: 2  . Years of education: 12 +  . Highest education level: Not on file  Occupational History  . Occupation: disability, medical  Tobacco Use  . Smoking status: Never Smoker  . Smokeless tobacco: Never Used  Vaping Use  . Vaping Use: Never used  Substance and Sexual Activity  . Alcohol use: No    Alcohol/week: 0.0 standard drinks    Comment: 12-25-2015 per pt no  . Drug use: No    Comment: 21-12-2015 per pt no   . Sexual activity: Not Currently    Birth control/protection: Surgical  Other Topics Concern  . Not on file  Social History Narrative   Patient has two adult children.   Patient is disabled.   Patient has a high school education and some trade school.   Patient is right-handed.   Patient drinks four glasses of soda and tea daily.   Social Determinants of Health   Financial Resource Strain: Not on file  Food Insecurity: No Food Insecurity  . Worried About Charity fundraiser in the Last Year: Never true  . Ran Out of Food in the Last Year: Never true  Transportation Needs: No Transportation Needs  . Lack of Transportation (Medical): No  . Lack of Transportation (Non-Medical): No  Physical Activity: Not on file  Stress: Not on file  Social Connections: Not on file  Intimate Partner Violence: Not on file    Review of Systems: Unable to obtain due to cognitive status   Physical Exam: Vital signs in last 24 hours: Temp:  [97.4 F (36.3 C)-98.3 F (36.8 C)] 98 F (36.7 C) (05/23 0421) Pulse Rate:  [78-90] 90 (05/23 1140) Resp:  [18-20] 18 (05/23 0015) BP: (88-150)/(54-119) 128/85 (05/23 1140) SpO2:  [92 %-100 %] 95 % (05/23 1140) Last BM Date: 05/16/2020 General:  Resting with eyes closed, no distress, unintelligible muttering, does not  follow commands, frail, appears older than stated age Head:  Normocephalic and atraumatic. Lungs:  Clear throughout to auscultation.    Heart:  S1 S2 present, no tachycardia Abdomen:  Protuberant upper abdomen, firm, no TTP, soft lower abdomen, +BS Rectal:  Deferred  Extremities:  With trace pedal and pre-tibial edema lower extremities  Neurologic:  Somnolent.   Intake/Output from previous day: 05/22 0701 - 05/23 0700 In: 1321.7 [I.V.:721.7; IV Piggyback:600] Out: 700 [Urine:700] Intake/Output this shift: No intake/output data recorded.  Lab Results: Recent Labs    05/31/2020 0605 06/04/20 0533  WBC 8.7 9.1  HGB 8.9* 9.0*  HCT 27.4* 27.6*  PLT 214 213   BMET Recent Labs    06/11/2020 0605 06/04/20 0533  NA 145 147*  K 3.5 3.4*  CL 113* 114*  CO2 23 25  GLUCOSE 106* 102*  BUN 39* 29*  CREATININE 1.39* 1.01*  CALCIUM 7.7* 7.8*   LFT Recent Labs    06/04/2020 0605 06/04/20 0533  PROT 4.9* 5.2*  ALBUMIN 2.1* 2.3*  AST 196* 180*  ALT 46* 39  ALKPHOS 160* 155*  BILITOT 2.5* 2.6*  BILIDIR 1.0*  --   IBILI 1.5*  --    Studies/Results: CT Head Wo Contrast  Result Date: 05/19/2020 CLINICAL DATA:  Head trauma. Found on floor by staff. Baseline altered mental status. EXAM: CT HEAD WITHOUT CONTRAST TECHNIQUE: Contiguous axial images were obtained from the base of the skull through  the vertex without intravenous contrast. COMPARISON:  CT head 05/30/2018 FINDINGS: Brain: No evidence of large-territorial acute infarction. No parenchymal hemorrhage. No mass lesion. No extra-axial collection. No mass effect or midline shift. No hydrocephalus. Basilar cisterns are patent. Vascular: No hyperdense vessel. Skull: No acute fracture or focal lesion. Sinuses/Orbits: Paranasal sinuses and mastoid air cells are clear. The orbits are unremarkable. Other: None. IMPRESSION: No acute intracranial abnormality. Electronically Signed   By: Iven Finn M.D.   On: 05/17/2020 06:40   DG Chest  Port 1 View  Result Date: 05/28/2020 CLINICAL DATA:  55 year old female found down.  Weakness. EXAM: PORTABLE CHEST 1 VIEW COMPARISON:  Portable chest 05/24/2020 and earlier. Modified barium swallow 05/25/2020. FINDINGS: Residual barium mixed with retained stool in the upper abdominal large bowel. Continued low lung volumes. Stable right PICC line. Mediastinal contours remain normal. Diffuse coarse bilateral pulmonary opacity appears to be chronic and stable over this year's series of exams. No superimposed pneumothorax, pleural effusion or confluent pulmonary opacity. Visualized tracheal air column is within normal limits. No acute osseous abnormality identified. IMPRESSION: 1. Continued low lung volumes and coarse bilateral pulmonary interstitial markings which do not appear significantly changed since January. Favor a chronic interstitial lung disease over an acute pulmonary process. 2. PICC line remains in place. 3. Residual barium from a modified swallow 9 days ago mixed with residual stool in the colon. Electronically Signed   By: Genevie Ann M.D.   On: 05/25/2020 07:27    Impression:  55 y.o. year old female resident of [facility] with history of skull base hemangioma, seizures, PNES, COPD, recently inpatient for severe sepsis due to MSSA bacteremia and discharged 5/15 receiving Ancef through PICC line, cirrhosis most likely due to fatty liver and had actually established care with Dr. Tarri Glenn in March 2022, CT abd/pelvis with contrast April 2022 with cirrhosis, paraesophageal and gastric varices, presenting to the ED yesterday with acute hepatic encephalopathy and had been found down. Admitted with HE, acute renal injury, urinary retention, and now with acute episode of moderate volume bright red blood emesis.   On exam, patient is unable to provide history due to mental status changes. Somnolent on exam; per nursing, she had been conversant this morning prior to vomiting. Repeat H/H is  pending, and H/H on admission yesterday was 9. INR also ordered, as last was in the low 2 range during prior admission.   Cirrhosis: likely due to fatty liver but evaluation is in process per Dr. Tarri Glenn. Known paraesophageal and gastric varices on CT. Likely tense ascites on exam and will need further evaluation of this after acute UGI bleed assessed.   Acute UGI bleed: concern for variceal in light of clinical scenario. Appreciate hospitalist already placing orders for Octreotide and Protonix infusion. She is on Ancef due to MSSA bacteremia; reviewed with Dr. Manuella Ghazi. We are also adding ceftriaxone due to GI bleeding in known cirrhosis. Last EGD in 2014 with gastritis.   Hepatic encephalopathy: will transition to lactulose enemas as she is unsafe to take orally now. Would benefit from adding Xifaxan in future once stabilized.  Elevated LFTs: similar to prior as outpatient.   I contacted niece, who is listed as contact person on chart. Discussed with her clinical scenario. Discussed need for EGD when stabilized. She is aware of risks and benefits. Niece and son will be coming to hospital shortly.   Plan: Remain NPO H/H now INR stat now Start octreotide and Protonix infusion Add Rocephin Will be pursuing  EGD once stabilized Will need [name] para after acute GI bleeding addressed   Annitta Needs, PhD, ANP-BC [facility]      LOS: 1 day    06/04/2020, 12:29 PM

## 2020-06-04 NOTE — Progress Notes (Signed)
Verbal order from Dr. Abbey Chatters for patient to be clear liquid diet until midnight, then be NPO. Order placed. Will continue to monitor.

## 2020-06-04 NOTE — TOC Initial Note (Addendum)
Transition of Care [facility]) - Initial/Assessment Note    Patient Details  Name: [name] MRN: [id number] Date of Birth: Oct 25, 1965  [facility]) CM/SW Contact:    Salome Arnt, [place] Phone Number: 06/04/2020, 1:27 PM  Clinical Narrative: Pt admitted due to acute hepatic encephalopathy. Pt has been at [facility] for about a week for PT and IV antibiotics. LCSW spoke with pt's niece, Estill Bamberg who states the family has decided to take pt home. She will live with her son and daughter-in-law ([place]) and pt's mother, siblings, and daughter are all close by. There is no POA, but Estill Bamberg indicates all family is agreeable. Pt will have around the clock care. Discussed DME needs and Estill Bamberg indicates they will need a hospital bed. Referred to [facility]. [facility] had accepted pt for home health services prior to d/c to SNF. Marjory Lies with [facility] notified of need for HHPT, RN, and SW and accepts. LCSW notified Pam with [facility] that pt will be returning home on IV antibiotics through 5/29. MD updated. Palliative consult pending.                  Expected Discharge Plan: [place] Barriers to Discharge: Continued Medical Work up   Patient Goals and CMS Choice Patient states their goals for this hospitalization and ongoing recovery are:: return home      Expected Discharge Plan and Services Expected Discharge Plan: [place] In-house Referral: Clinical Social Work   Post Acute Care Choice: [place] arrangements for the past 2 months: [place]                 DME Arranged: Hospital bed DME Agency: [facility] Date DME Agency Contacted: 06/04/20 Time DME Agency Contacted: [phone] Representative spoke with at DME Agency: Freda Munro [place]: RN,PT,Social Work [place]: [place] Date [place]: 06/04/20 Time [place]: [place] Representative spoke with at [place]: Marjory Lies  Prior Living Arrangements/Services Living arrangements for the past 2 months: [place] Lives with:: Facility Resident Patient language and need for interpreter reviewed:: Yes Do you feel safe going back to the place where you live?: Yes      Need for Family Participation in Patient Care: Yes (Comment) Care giver support system in place?: Yes (comment)   Criminal Activity/Legal Involvement Pertinent to Current Situation/Hospitalization: No - Comment as needed  Activities of Daily Living Home Assistive Devices/Equipment: Environmental consultant (specify type) ADL Screening (condition at time of admission) Patient's cognitive ability adequate to safely complete daily activities?: No Is the patient deaf or have difficulty hearing?: No Does the patient have difficulty seeing, even when wearing glasses/contacts?: No Does the patient have difficulty concentrating, remembering, or making decisions?: Yes Patient able to express need for assistance with ADLs?: Yes Does the patient have difficulty dressing or bathing?: Yes Independently performs ADLs?: No Communication: Independent Dressing (OT): Needs assistance Is this a change from baseline?: Pre-admission baseline Grooming: Needs assistance Is this a change from baseline?: Pre-admission baseline Bathing: Independent In/Out Bed: Needs assistance Is this a change from baseline?: Pre-admission baseline Walks in Home: Needs assistance Is this a change from baseline?: Pre-admission baseline Does the patient have difficulty walking or climbing stairs?: Yes Weakness of Legs: None Weakness of Arms/Hands: None  Permission Sought/Granted                  Emotional Assessment   Attitude/Demeanor/Rapport: Unable to  Assess Affect (typically observed): Unable to Assess Orientation: : Oriented to Self Alcohol / Substance Use: Not Applicable Psych Involvement: No (comment)  Admission diagnosis:  Hepatic encephalopathy ([place])  [K72.90] Dehydration [E86.0] Acute hepatic encephalopathy [K72.00] Fall, initial encounter [W19.XXXA] Patient Active Problem List   Diagnosis Date Noted  . Acute hepatic encephalopathy 06/04/2020  . Pressure injury of skin 05/26/2020  . Acute respiratory failure with hypoxia ([place]) 05/16/2020  . Severe sepsis with acute organ dysfunction due to methicillin susceptible Staphylococcus aureus (MSSA) ([place]) 05/16/2020  . Ascites   . Fever in adult 05/13/2020  . SIRS (systemic inflammatory response syndrome) ([place]) 05/13/2020  . Rib injury 05/13/2020  . Other cirrhosis of liver ([place]) 05/13/2020  . 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 Fever 05/13/2020  . Memory loss   . Transaminasemia   . Visual hallucination   . Generalized weakness 02/21/2020  . Fall at home, initial encounter 02/21/2020  . Hepatic encephalopathy ([place]) 02/21/2020  . Acute metabolic encephalopathy 96/22/2979  . Hyperammonemia ([place]) 02/13/2020  . Meningioma ([place]) 09/12/2019  . Benign neoplasm of cerebral meninges ([place]) 09/09/2019  . Anxiety 12/30/2018  . MDD (major depressive disorder), recurrent episode, mild ([place]) 01/08/2018  . Widowed - July 2019 08/07/2017  . Sleep apnea 06/09/2017  . Colon polyp 06/05/2017  . Elevated liver function tests 06/05/2017  . Fatty liver   . Mild neurocognitive disorder 02/04/2017  . Seizure ([place]) 11/14/2016  . CAP (community acquired pneumonia) 11/09/2016  . 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 Lobar pneumonia ([place]) 11/28/2015  . Partial symptomatic epilepsy with complex partial seizures, not intractable, with status epilepticus ([place]) 07/24/2015  . OA (osteoarthritis) of knee 05/07/2015  . 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 Seizure disorder ([place]) 07/17/2011   PCP:  Caprice Renshaw, MD Pharmacy:   [facility] [address] [place] [address] Phone: [phone] Fax: [phone]     Social Determinants of Health (SDOH) Interventions     Readmission Risk Interventions Readmission Risk Prevention Plan 06/04/2020 05/14/2020 02/24/2020  Medication Screening - - Complete  Transportation Screening Complete Complete Complete  Home Care Screening - Complete -  Medication Review (RN CM) - Complete -  Medication Review (RN Care Manager) Complete - -  HRI or Home Care Consult Complete - -  SW Recovery Care/Counseling Consult Complete - -  Palliative Care Screening Complete - -  [place] Patient Refused - -  Some recent data might be hidden

## 2020-06-04 NOTE — Progress Notes (Signed)
INR 2.3. MELD Na 19. I have placed another order for H/H stat as this was not done when initially ordered. Giving Vit K 10 mg IV X 1 now.

## 2020-06-05 ENCOUNTER — Inpatient Hospital Stay (HOSPITAL_COMMUNITY): Payer: Medicare HMO | Admitting: Anesthesiology

## 2020-06-05 ENCOUNTER — Inpatient Hospital Stay (HOSPITAL_COMMUNITY): Payer: Medicare HMO

## 2020-06-05 ENCOUNTER — Encounter (HOSPITAL_COMMUNITY): Admission: EM | Disposition: E | Payer: Self-pay | Source: Home / Self Care | Attending: Internal Medicine

## 2020-06-05 ENCOUNTER — Encounter (HOSPITAL_COMMUNITY): Payer: Self-pay | Admitting: Internal Medicine

## 2020-06-05 DIAGNOSIS — Z515 Encounter for palliative care: Secondary | ICD-10-CM | POA: Diagnosis not present

## 2020-06-05 DIAGNOSIS — K7200 Acute and subacute hepatic failure without coma: Secondary | ICD-10-CM | POA: Diagnosis not present

## 2020-06-05 DIAGNOSIS — K3189 Other diseases of stomach and duodenum: Secondary | ICD-10-CM | POA: Diagnosis not present

## 2020-06-05 DIAGNOSIS — K209 Esophagitis, unspecified without bleeding: Secondary | ICD-10-CM | POA: Diagnosis not present

## 2020-06-05 DIAGNOSIS — K766 Portal hypertension: Secondary | ICD-10-CM | POA: Diagnosis not present

## 2020-06-05 DIAGNOSIS — Z7189 Other specified counseling: Secondary | ICD-10-CM | POA: Diagnosis not present

## 2020-06-05 DIAGNOSIS — K2970 Gastritis, unspecified, without bleeding: Secondary | ICD-10-CM | POA: Diagnosis not present

## 2020-06-05 HISTORY — PX: ESOPHAGOGASTRODUODENOSCOPY (EGD) WITH PROPOFOL: SHX5813

## 2020-06-05 HISTORY — PX: BIOPSY: SHX5522

## 2020-06-05 LAB — CBC
HCT: 24.9 % — ABNORMAL LOW (ref 36.0–46.0)
Hemoglobin: 8 g/dL — ABNORMAL LOW (ref 12.0–15.0)
MCH: 31.7 pg (ref 26.0–34.0)
MCHC: 32.1 g/dL (ref 30.0–36.0)
MCV: 98.8 fL (ref 80.0–100.0)
Platelets: 178 10*3/uL (ref 150–400)
RBC: 2.52 MIL/uL — ABNORMAL LOW (ref 3.87–5.11)
RDW: 24.7 % — ABNORMAL HIGH (ref 11.5–15.5)
WBC: 9 10*3/uL (ref 4.0–10.5)
nRBC: 0 % (ref 0.0–0.2)

## 2020-06-05 LAB — TYPE AND SCREEN
ABO/RH(D): A POS
Antibody Screen: NEGATIVE

## 2020-06-05 LAB — COMPREHENSIVE METABOLIC PANEL
ALT: 31 U/L (ref 0–44)
AST: 175 U/L — ABNORMAL HIGH (ref 15–41)
Albumin: 2 g/dL — ABNORMAL LOW (ref 3.5–5.0)
Alkaline Phosphatase: 146 U/L — ABNORMAL HIGH (ref 38–126)
Anion gap: 7 (ref 5–15)
BUN: 23 mg/dL — ABNORMAL HIGH (ref 6–20)
CO2: 22 mmol/L (ref 22–32)
Calcium: 7.3 mg/dL — ABNORMAL LOW (ref 8.9–10.3)
Chloride: 116 mmol/L — ABNORMAL HIGH (ref 98–111)
Creatinine, Ser: 0.85 mg/dL (ref 0.44–1.00)
GFR, Estimated: >60 mL/min (ref >60)
Glucose, Bld: 115 mg/dL — ABNORMAL HIGH (ref 70–99)
Potassium: 3.5 mmol/L (ref 3.5–5.1)
Sodium: 145 mmol/L (ref 135–145)
Total Bilirubin: 2.7 mg/dL — ABNORMAL HIGH (ref 0.3–1.2)
Total Protein: 4.8 g/dL — ABNORMAL LOW (ref 6.5–8.1)

## 2020-06-05 LAB — HEMOGLOBIN AND HEMATOCRIT, BLOOD
HCT: 25.5 % — ABNORMAL LOW (ref 36.0–46.0)
Hemoglobin: 8.2 g/dL — ABNORMAL LOW (ref 12.0–15.0)

## 2020-06-05 LAB — PROTIME-INR
INR: 1.5 — ABNORMAL HIGH (ref 0.8–1.2)
Prothrombin Time: 18.4 seconds — ABNORMAL HIGH (ref 11.4–15.2)

## 2020-06-05 LAB — AMMONIA: Ammonia: 77 umol/L — ABNORMAL HIGH (ref 9–35)

## 2020-06-05 LAB — MAGNESIUM: Magnesium: 2 mg/dL (ref 1.7–2.4)

## 2020-06-05 SURGERY — ESOPHAGOGASTRODUODENOSCOPY (EGD) WITH PROPOFOL
Anesthesia: General

## 2020-06-05 MED ORDER — LACTATED RINGERS IV SOLN: INTRAVENOUS | Status: DC

## 2020-06-05 MED ORDER — SODIUM CHLORIDE 0.9 % IV SOLN: INTRAVENOUS | Status: DC

## 2020-06-05 MED ORDER — STERILE WATER FOR IRRIGATION IR SOLN
Status: DC | PRN
  Administered 2020-06-05: 100 mL

## 2020-06-05 MED ORDER — PANTOPRAZOLE SODIUM 40 MG PO TBEC
40.0000 mg | DELAYED_RELEASE_TABLET | Freq: Two times a day (BID) | ORAL | Status: DC
  Administered 2020-06-05 – 2020-06-06 (×3): 40 mg via ORAL
  Filled 2020-06-05 (×8): qty 1

## 2020-06-05 MED ORDER — RIFAXIMIN 550 MG PO TABS
550.0000 mg | ORAL_TABLET | Freq: Two times a day (BID) | ORAL | Status: DC
  Administered 2020-06-05 – 2020-06-07 (×3): 550 mg via ORAL
  Filled 2020-06-05 (×8): qty 1

## 2020-06-05 MED ORDER — PROPOFOL 500 MG/50ML IV EMUL
INTRAVENOUS | Status: DC | PRN
  Administered 2020-06-05: 50 ug/kg/min via INTRAVENOUS

## 2020-06-05 MED ORDER — PROPOFOL 10 MG/ML IV BOLUS
INTRAVENOUS | Status: DC | PRN
  Administered 2020-06-05: 20 mg via INTRAVENOUS
  Administered 2020-06-05: 50 mg via INTRAVENOUS

## 2020-06-05 MED ORDER — LORAZEPAM 1 MG PO TABS
1.0000 mg | ORAL_TABLET | Freq: Four times a day (QID) | ORAL | Status: DC | PRN
  Filled 2020-06-05: qty 1

## 2020-06-05 MED ORDER — FLUOXETINE HCL 20 MG PO CAPS
80.0000 mg | ORAL_CAPSULE | Freq: Every day | ORAL | Status: DC
  Filled 2020-06-05 (×3): qty 4

## 2020-06-05 NOTE — Op Note (Signed)
[facility] Patient Name: [name] Procedure Date: 06/09/2020 8:12 AM MRN: [id number] Date of Birth: 1965-06-03 Attending MD: Elon Alas. Abbey Chatters DO CSN: [id number] Age: 55 Admit Type: Inpatient Procedure:                Upper GI endoscopy Indications:              Hematemesis Providers:                Elon Alas. Abbey Chatters, DO, Tammy Vaught, RN, Dereck Leep, Technician Referring MD:              Medicines:                See the Anesthesia note for documentation of the                            administered medications Complications:            No immediate complications. Estimated Blood Loss:     Estimated blood loss was minimal. Procedure:                Pre-Anesthesia Assessment:                           - The anesthesia plan was to use monitored                            anesthesia care (MAC).                           After obtaining informed consent, the endoscope was                            passed under direct vision. Throughout the                            procedure, the patient's blood pressure, pulse, and                            oxygen saturations were monitored continuously. The                            [id number]-H190 ([id number]) scope was introduced through the                            mouth, and advanced to the second part of duodenum.                            The upper GI endoscopy was accomplished without                            difficulty. The patient tolerated the procedure                            well. Scope In: 9:08:05 AM  Scope Out: 9:13:31 AM Total Procedure Duration: 0 hours 5 minutes 26 seconds  Findings:      Non-severe esophagitis with no bleeding was found at the       gastroesophageal junction.      There is no endoscopic evidence of bleeding, ulcerations or varices in       the entire esophagus.      Moderate portal hypertensive gastropathy was found in the entire       examined stomach.       Localized mild inflammation characterized by erythema was found in the       gastric antrum. Biopsies were taken with a cold forceps for Helicobacter       pylori testing.      There is no endoscopic evidence of varices in the entire examined       stomach.      The duodenal bulb, first portion of the duodenum and second portion of       the duodenum were normal. Impression:               - Non-severe esophagitis with no bleeding.                           - Portal hypertensive gastropathy.                           - Gastritis. Biopsied.                           - Normal duodenal bulb, first portion of the                            duodenum and second portion of the duodenum. Moderate Sedation:      Per Anesthesia Care Recommendation:           - Return patient to hospital ward for ongoing care.                           - Soft diet.                           - Use a proton pump inhibitor PO BID.                           - Okay to discontinue Octreotide gtt once bag empty.                           - Continue to monitor H&H and transfuse for <7.0                           - No obvious source of bleeding found, possible                            ooze from PHG. No gastric of esophageal varices                            identified. Procedure Code(s):        --- Professional ---  32671, Esophagogastroduodenoscopy, flexible,                            transoral; with biopsy, single or multiple Diagnosis Code(s):        --- Professional ---                           K20.90, Esophagitis, unspecified without bleeding                           K76.6, Portal hypertension                           K31.89, Other diseases of stomach and duodenum                           K29.70, Gastritis, unspecified, without bleeding                           K92.0, Hematemesis CPT copyright 2019 American Medical Association. All rights reserved. The codes documented in this report  are preliminary and upon coder review may  be revised to meet current compliance requirements. Elon Alas. Abbey Chatters, DO [place] Abbey Chatters, DO 05/16/2020 9:19:25 AM This report has been signed electronically. Number of Addenda: 0

## 2020-06-05 NOTE — Progress Notes (Signed)
Patient underwent EGD this morning due to hematemesis.  No active bleeding or stigmata of bleeding found in the entire upper GI tract.  Does have moderate portal hypertensive gastropathy with may have oozed though has since stopped.  No esophageal or gastric varices identified on exam.  Biopsies were taken to rule out H. pylori.  Okay to switch to p.o. PPI twice daily..  Recommend discontinuing octreotide gtt. Continue on Rocephin for SBP prophylaxis.  Okay to start on soft diet.  Continue to monitor hemoglobin and transfuse for less than 7.  GI to continue to follow.

## 2020-06-05 NOTE — Plan of Care (Signed)
  Problem: Education: Goal: Knowledge of General Education information will improve Description: Including pain rating scale, medication(s)/side effects and non-pharmacologic comfort measures Outcome: Progressing   Problem: Clinical Measurements: Goal: Diagnostic test results will improve Outcome: Progressing Goal: Respiratory complications will improve Outcome: Progressing Goal: Cardiovascular complication will be avoided Outcome: Progressing   Problem: Activity: Goal: Risk for activity intolerance will decrease Outcome: Progressing   Problem: Coping: Goal: Level of anxiety will decrease Outcome: Progressing   Problem: Elimination: Goal: Will not experience complications related to bowel motility Outcome: Progressing Goal: Will not experience complications related to urinary retention Outcome: Progressing   Problem: Safety: Goal: Ability to remain free from injury will improve Outcome: Progressing   Problem: Skin Integrity: Goal: Risk for impaired skin integrity will decrease Outcome: Progressing

## 2020-06-05 NOTE — Interval H&P Note (Signed)
History and Physical Interval Note:  05/15/2020 8:40 AM  [name]  has presented today for surgery, with the diagnosis of hematemesis.  The various methods of treatment have been discussed with the patient and family. After consideration of risks, benefits and other options for treatment, the patient has consented to  Procedure(s): ESOPHAGOGASTRODUODENOSCOPY (EGD) WITH PROPOFOL (N/A) as a surgical intervention.  The patient's history has been reviewed, patient examined, no change in status, stable for surgery.  I have reviewed the patient's chart and labs.  Questions were answered to the patient's satisfaction.     Eloise Harman

## 2020-06-05 NOTE — Progress Notes (Signed)
Palliative: Ms. Judy, Pollman, has gone for EGD this morning.  She was found to have no active bleeding or stigmata of bleeding in the entire upper GI tract.  It was noted that she had moderate portal hypertensive gastropathy.  No gas trick or esophageal varices identified.  Biopsies were taken to rule out H. Pylori.   Ms. Tzipporah, Nagorski, is resting quietly in bed with her eyes closed.  Her mother [name] and her niece Estill Bamberg are at bedside.  They share that Blakely keeps her eyes closed most of the time now due to her brain tumor, light affects her.  They share that Starlina has been able to tell them the results of her EGD.  We also discussed the findings and recommendations.  They share that their goal is for Kritika to return to her home, her son and daughter-in-law are moving in with her to provide 24/7 care.  Mother [name] shares that they are all they are together and able to provide care.  Estill Bamberg shares that hospital bed was delivered today.  They are agreeable to home health services and outpatient palliative services through hospice of [facility].  No questions or concerns at this time.  Conference with attending, bedside nursing staff, transition of care team related to patient condition, needs, goals of care, disposition.  Plan:    Continue to treat the treatable but no CPR or intubation.  Home with home health and outpatient palliative services with hospice of [facility].  26 minutes  Quinn Axe, NP Palliative medicine team Team phone [phone] Greater than 50% of this time was spent counseling and coordinating care related to the above assessment and plan.

## 2020-06-05 NOTE — Anesthesia Preprocedure Evaluation (Addendum)
Anesthesia Evaluation  Patient identified by MRN, date of birth, ID band Patient awake    Reviewed: Allergy & Precautions, NPO status , Patient's Chart, lab work & pertinent test results, reviewed documented beta blocker date and time   Airway Mallampati: II  TM Distance: >3 FB Neck ROM: Full    Dental  (+) Edentulous Upper, Edentulous Lower   Pulmonary sleep apnea , pneumonia,    Pulmonary exam normal breath sounds clear to auscultation       Cardiovascular Exercise Tolerance: Poor hypertension, Pt. on home beta blockers Normal cardiovascular exam Rhythm:Regular Rate:Normal     Neuro/Psych Seizures -, Well Controlled,  PSYCHIATRIC DISORDERS Anxiety Depression Meningioma   Mild neurocognitive disorder    GI/Hepatic negative GI ROS, (+) Cirrhosis   Esophageal Varices  substance abuse  alcohol use, Hepatic encephalopathy    Endo/Other  negative endocrine ROS  Renal/GU negative Renal ROS     Musculoskeletal  (+) Arthritis ,   Abdominal   Peds  Hematology negative hematology ROS (+)   Anesthesia Other Findings Sepsis  ascitis   Reproductive/Obstetrics                            Anesthesia Physical Anesthesia Plan  ASA: IV  Anesthesia Plan: General   Post-op Pain Management:    Induction: Intravenous  PONV Risk Score and Plan: TIVA  Airway Management Planned: Nasal Cannula and Natural Airway  Additional Equipment:   Intra-op Plan:   Post-operative Plan: Possible Post-op intubation/ventilation  Informed Consent: I have reviewed the patients History and Physical, chart, labs and discussed the procedure including the risks, benefits and alternatives for the proposed anesthesia with the patient or authorized representative who has indicated his/her understanding and acceptance.    Discussed DNR with patient and Suspend DNR.   Dental advisory given  Plan Discussed with: CRNA  and Surgeon  Anesthesia Plan Comments:         Anesthesia Quick Evaluation

## 2020-06-05 NOTE — Transfer of Care (Signed)
Immediate Anesthesia Transfer of Care Note  Patient: [name]  Procedure(s) Performed: ESOPHAGOGASTRODUODENOSCOPY (EGD) WITH PROPOFOL (N/A ) BIOPSY  Patient Location: PACU  Anesthesia Type:General  Level of Consciousness: awake  Airway & Oxygen Therapy: Patient Spontanous Breathing  Post-op Assessment: Report given to RN  Post vital signs: Reviewed and stable  Last Vitals:  Vitals Value Taken Time  BP 104/75 06/07/2020 0921  Temp    Pulse 74 05/31/2020 0924  Resp 26 05/28/2020 0924  SpO2 94 % 05/31/2020 0924  Vitals shown include unvalidated device data.  Last Pain:  Vitals:   05/18/2020 0903  TempSrc:   PainSc: 0-No pain         Complications: No complications documented.

## 2020-06-05 NOTE — Anesthesia Postprocedure Evaluation (Signed)
Anesthesia Post Note  Patient: [name]  Procedure(s) Performed: ESOPHAGOGASTRODUODENOSCOPY (EGD) WITH PROPOFOL (N/A ) BIOPSY  Patient location during evaluation: PACU Anesthesia Type: General Level of consciousness: awake and alert Pain management: pain level controlled Vital Signs Assessment: post-procedure vital signs reviewed and stable Respiratory status: spontaneous breathing Cardiovascular status: blood pressure returned to baseline and stable Postop Assessment: no apparent nausea or vomiting Anesthetic complications: no   No complications documented.   Last Vitals:  Vitals:   05/22/2020 0830 05/21/2020 0845  BP:  131/81  Pulse: 79 86  Resp: 13 15  Temp:    SpO2: 99% 97%    Last Pain:  Vitals:   05/18/2020 0903  TempSrc:   PainSc: 0-No pain                 Windy Dudek

## 2020-06-05 NOTE — Progress Notes (Signed)
PROGRESS NOTE    [name]  FIE:[id number] DOB: Oct 06, 1965 DOA: 05/16/2020 PCP: Caprice Renshaw, MD   Brief Narrative:   Monica Jaroszewski Sheltonis a 55 y.o.femalewith medical history significant forCOPD, meningioma, cardiac arrest December 2020, hepatic steatosis,PNES, cognitive impairment, depression,andcomplex partial seizurewho presented to the ED after being found down at her skilled facility.  She was admitted with acute hepatic encephalopathy.  5/23 she did develop some hematemesis and has undergone EGD on 5/24 with findings of some mild esophagitis as well as gastritis.  She has been restarted on a diet with oral PPI.  Paracentesis ordered for today.  Family would like to take patient home with home health and outpatient palliative care once stable for discharge.  Assessment & Plan:   Active Problems:   Acute hepatic encephalopathy   Acute hepatic encephalopathy -Continue on lactulose and monitor ammonia levels which are fluctuating -Uncertain if she has been taking her medications or not versus need for adjustment  Hematemesis-resolved -Status post EGD 5/24 with findings of esophagitis and gastritis, but no active bleeding -Octreotide and protonix infusions to discontinue -Oral PPI BID -Monitor H/H and transfuse for H<7 -Soft diet restarted  AKI-improving -Likely prerenal -Stop IV fluid hydration -Avoid nephrotoxic agents and monitor intake and output -Recheck a.m. labs  Lactic acidosis-stable -Likely related to dehydration -Continue to monitor  MSSA bacteremia -Continue on Ancef through 5/29 to finish treatment  Urinary retention -Foley catheter in place from prior admission -No signs of UTI currently noted -Plan to attempt voiding trial once more awake and alert  Chronic pain -Hold home medications until more alert and awake  Liver cirrhosis -Some ascites for which paracentesis ordered -Elevated ammonia levels as noted below, on lactulose -Started  on Rocephin per GI for SBP prophylaxis  Anemia of chronic disease-stable -Transfuse for hemoglobin less than 7 -No overt bleeding noted  Recurrent gait instability with frequent falls/dizziness -CT head with no acute findings -Patient to go home with home health  Complex partial seizures/PNES -Continue home Keppra and Lamictal -No seizure activity appreciated at facility  Depression -Resume fluoxetine and lorazepam  Meningioma -Follows with Dr. [name] outpatient  Transaminitis -Appears to be a persistent problem in the setting of liver cirrhosis/NAFLD with hepatic congestion -Continue to monitor  Restless leg -Continue holding Mirapex due to prior confusion -No signs of UTI currently noted  DVT prophylaxis: Heparin to SCDs Code Status: DNR Family Communication: Discussed with family at bedside 5/24 Disposition Plan:  Status is: Inpatient  Remains inpatient appropriate because:Ongoing diagnostic testing needed not appropriate for outpatient work up, IV treatments appropriate due to intensity of illness or inability to take PO and Inpatient level of care appropriate due to severity of illness   Dispo: The patient is from: SNF  Anticipated d/c is to: Home with home health and outpt palliative  Patient currently is not medically stable to d/c.              Difficult to place patient No  Skin Assessment:  I have examined the patient's skin and I agree with the wound assessment as performed by the wound care RN as outlined below:  Pressure Injury 05/25/20 Sacrum Mid Stage 1 -  Intact skin with non-blanchable redness of a localized area usually over a bony prominence. (Active)  05/25/20 1746  Location: Sacrum  Location Orientation: Mid  Staging: Stage 1 -  Intact skin with non-blanchable redness of a localized area usually over a bony prominence.  Wound Description (Comments):   Present on  Admission: No    Consultants:    GI  Palliative  Procedures:   See below  Antimicrobials:  Anti-infectives (From admission, onward)   Start     Dose/Rate Route Frequency Ordered Stop   05/17/2020 2200  rifaximin (XIFAXAN) tablet 550 mg        550 mg Oral 2 times daily 06/08/2020 1524     06/04/20 1330  cefTRIAXone (ROCEPHIN) 1 g in sodium chloride 0.9 % 100 mL IVPB        1 g 200 mL/hr over 30 Minutes Intravenous Daily 06/04/20 1320     06/10/2020 1400  ceFAZolin (ANCEF) IVPB 2g/100 mL premix       Note to Pharmacy: Indication:  MSSA bacteremia  First Dose: Yes Last Day of Therapy:  Jun 10, 2020 Labs - Once weekly:  CBC/D and BMP, Labs - Every other week:  ESR and CRP Method of administration: IV Push Method of administration may be changed at the discretion of home infusion pharmacist based upon assessme   2 g 200 mL/hr over 30 Minutes Intravenous Every 8 hours 06/04/2020 1044         Subjective: Patient seen and evaluated today with no new acute complaints or concerns. No acute concerns or events noted overnight.  Confused, but arousable.  No bleeding episodes overnight.  Objective: Vitals:   05/31/2020 0921 05/21/2020 1015 05/27/2020 1030 06/02/2020 1143  BP: 104/75 124/81 128/85   Pulse:  90 90   Resp:  (!) 22 (!) 21   Temp: 97.6 F (36.4 C)   98 F (36.7 C)  TempSrc:    Oral  SpO2: 94% 100% 100%   Weight:      Height:        Intake/Output Summary (Last 24 hours) at 05/20/2020 1531 Last data filed at 06/04/2020 7048 Gross per 24 hour  Intake 866.25 ml  Output 400 ml  Net 466.25 ml   Filed Weights   05/28/2020 0544  Weight: 63 kg    Examination:  General exam: Appears calm and comfortable, confused Respiratory system: Clear to auscultation. Respiratory effort normal. Cardiovascular system: S1 & S2 heard, RRR.  Gastrointestinal system: Abdomen is soft Central nervous system: Alert and awake Extremities: No edema Skin: No significant lesions noted Psychiatry: Flat affect.    Data Reviewed:  I have personally reviewed following labs and imaging studies  CBC: Recent Labs  Lab 06/08/2020 0605 06/04/20 0533 06/04/20 1532 06/04/20 2049 06/02/2020 0143 05/26/2020 0507  WBC 8.7 9.1  --   --   --  9.0  NEUTROABS 5.8  --   --   --   --   --   HGB 8.9* 9.0* 9.6* 8.9* 8.2* 8.0*  HCT 27.4* 27.6* 30.2* 28.2* 25.5* 24.9*  MCV 96.1 96.5  --   --   --  98.8  PLT 214 213  --   --   --  889   Basic Metabolic Panel: Recent Labs  Lab 06/01/2020 0605 06/04/20 0533 05/29/2020 0507  NA 145 147* 145  K 3.5 3.4* 3.5  CL 113* 114* 116*  CO2 '23 25 22  ' GLUCOSE 106* 102* 115*  BUN 39* 29* 23*  CREATININE 1.39* 1.01* 0.85  CALCIUM 7.7* 7.8* 7.3*  MG  --  2.2 2.0   GFR: Estimated Creatinine Clearance: 62.7 mL/min (by C-G formula based on SCr of 0.85 mg/dL). Liver Function Tests: Recent Labs  Lab 05/22/2020 0605 06/04/20 0533 06/06/2020 0507  AST 196* 180* 175*  ALT 46*  39 31  ALKPHOS 160* 155* 146*  BILITOT 2.5* 2.6* 2.7*  PROT 4.9* 5.2* 4.8*  ALBUMIN 2.1* 2.3* 2.0*   Recent Labs  Lab 06/10/2020 0605  LIPASE 111*   Recent Labs  Lab 06/06/2020 0702 06/04/20 0533 05/20/2020 0555  AMMONIA 72* 55* 77*   Coagulation Profile: Recent Labs  Lab 06/04/20 1403 06/11/2020 0748  INR 2.3* 1.5*   Cardiac Enzymes: No results for input(s): CKTOTAL, CKMB, CKMBINDEX, TROPONINI in the last 168 hours. BNP (last 3 results) No results for input(s): PROBNP in the last 8760 hours. HbA1C: No results for input(s): HGBA1C in the last 72 hours. CBG: No results for input(s): GLUCAP in the last 168 hours. Lipid Profile: No results for input(s): CHOL, HDL, LDLCALC, TRIG, CHOLHDL, LDLDIRECT in the last 72 hours. Thyroid Function Tests: No results for input(s): TSH, T4TOTAL, FREET4, T3FREE, THYROIDAB in the last 72 hours. Anemia Panel: No results for input(s): VITAMINB12, FOLATE, FERRITIN, TIBC, IRON, RETICCTPCT in the last 72 hours. Sepsis Labs: Recent Labs  Lab 05/24/2020 9379 06/09/2020 0815  06/04/20 0533  LATICACIDVEN 1.6 2.0* 1.9    Recent Results (from the past 240 hour(s))  Culture, blood (routine x 2)     Status: None (Preliminary result)   Collection Time: 06/06/2020  8:12 AM   Specimen: Cath Site; Blood  Result Value Ref Range Status   Specimen Description CATH SITE BOTTLES DRAWN AEROBIC AND ANAEROBIC  Final   Special Requests Blood Culture adequate volume  Final   Culture   Final    NO GROWTH 1 DAY Performed at [facility], [address], [place], [place] [address]    Report Status PENDING  Incomplete  Culture, blood (routine x 2)     Status: None (Preliminary result)   Collection Time: 05/13/2020  8:15 AM   Specimen: Left Antecubital; Blood  Result Value Ref Range Status   Specimen Description   Final    LEFT ANTECUBITAL BOTTLES DRAWN AEROBIC AND ANAEROBIC   Special Requests Blood Culture adequate volume  Final   Culture   Final    NO GROWTH 1 DAY Performed at [facility], [address], [place], [place] [address]    Report Status PENDING  Incomplete  SARS CORONAVIRUS 2 (TAT 6-24 HRS) Nasopharyngeal Nasopharyngeal Swab     Status: None   Collection Time: 05/24/2020  8:48 AM   Specimen: Nasopharyngeal Swab  Result Value Ref Range Status   SARS Coronavirus 2 NEGATIVE NEGATIVE Final    Comment: (NOTE) SARS-CoV-2 target nucleic acids are NOT DETECTED.  The SARS-CoV-2 RNA is generally detectable in upper and lower respiratory specimens during the acute phase of infection. Negative results do not preclude SARS-CoV-2 infection, do not rule out co-infections with other pathogens, and should not be used as the sole basis for treatment or other patient management decisions. Negative results must be combined with clinical observations, patient history, and epidemiological information. The expected result is Negative.  Fact Sheet for Patients: SugarRoll.be  Fact Sheet for Healthcare  Providers: https://www.woods-mathews.com/  This test is not yet approved or cleared by the [place] FDA and  has been authorized for detection and/or diagnosis of SARS-CoV-2 by FDA under an Emergency Use Authorization (EUA). This EUA will remain  in effect (meaning this test can be used) for the duration of the COVID-19 declaration under Se ction 564(b)(1) of the Act, 21 U.S.C. section 360bbb-3(b)(1), unless the authorization is terminated or revoked sooner.  Performed at [facility], [place] [address],  [place], [place] [address]   MRSA PCR Screening     Status: None   Collection Time: 06/04/20  2:09 PM   Specimen: Nasal Mucosa; Nasopharyngeal  Result Value Ref Range Status   MRSA by PCR NEGATIVE NEGATIVE Final    Comment:        The GeneXpert MRSA Assay (FDA approved for NASAL specimens only), is one component of a comprehensive MRSA colonization surveillance program. It is not intended to diagnose MRSA infection nor to guide or monitor treatment for MRSA infections. Performed at [facility], [address], [place], [place] [address]          Radiology Studies: DG CHEST PORT 1 VIEW  Result Date: 06/04/2020 CLINICAL DATA:  PICC line placement EXAM: PORTABLE CHEST 1 VIEW COMPARISON:  04/03/2020 FINDINGS: Right upper DVT PICC now terminates in the region of the right subclavian artery. Cardiomediastinal silhouette and pulmonary vasculature within normal limits. Diffuse bilateral airspace opacities similar to prior examination. Hyperdense content of the colon likely related to barium utilized during swallow study. IMPRESSION: Right upper extremity PICC terminates in the region of the right subclavian vein. Unchanged diffuse bilateral airspace opacities. These results will be called to the ordering clinician or representative by the Radiologist Assistant, and communication documented in the PACS or [employer]. Electronically Signed   By: Miachel Roux  M.D.   On: 06/04/2020 15:56        Scheduled Meds: . Chlorhexidine Gluconate Cloth  6 each Topical Daily  . folic acid  1 mg Oral Daily  . lactulose  20 g Oral TID  . lamoTRIgine  150 mg Oral BID  . levETIRAcetam  1,000 mg Oral BID  . pantoprazole  40 mg Oral BID  . rifaximin  550 mg Oral BID   Continuous Infusions: . ceFAZolin 2 g (05/27/2020 0610)  . cefTRIAXone (ROCEPHIN)  IV Stopped (06/04/20 1410)  . lactated ringers Stopped (06/04/20 1435)     LOS: 2 days    Time spent: 35 minutes    Camrin Gearheart D Manuella Ghazi, DO [facility]  If 7PM-7AM, please contact night-coverage [email or web address] 05/25/2020, 3:31 PM

## 2020-06-06 ENCOUNTER — Encounter (HOSPITAL_COMMUNITY): Payer: Self-pay | Admitting: Internal Medicine

## 2020-06-06 ENCOUNTER — Inpatient Hospital Stay (HOSPITAL_COMMUNITY): Payer: Medicare HMO

## 2020-06-06 DIAGNOSIS — Z7189 Other specified counseling: Secondary | ICD-10-CM | POA: Diagnosis not present

## 2020-06-06 DIAGNOSIS — K7200 Acute and subacute hepatic failure without coma: Secondary | ICD-10-CM | POA: Diagnosis not present

## 2020-06-06 DIAGNOSIS — R188 Other ascites: Secondary | ICD-10-CM

## 2020-06-06 DIAGNOSIS — K922 Gastrointestinal hemorrhage, unspecified: Secondary | ICD-10-CM

## 2020-06-06 DIAGNOSIS — Z515 Encounter for palliative care: Secondary | ICD-10-CM | POA: Diagnosis not present

## 2020-06-06 DIAGNOSIS — K7460 Unspecified cirrhosis of liver: Secondary | ICD-10-CM

## 2020-06-06 LAB — BODY FLUID CELL COUNT WITH DIFFERENTIAL
Eos, Fluid: 0 %
Lymphs, Fluid: 49 %
Monocyte-Macrophage-Serous Fluid: 44 % — ABNORMAL LOW (ref 50–90)
Neutrophil Count, Fluid: 2 % (ref 0–25)
Other Cells, Fluid: 5 %
Total Nucleated Cell Count, Fluid: 32 cu mm (ref 0–1000)

## 2020-06-06 LAB — MAGNESIUM: Magnesium: 2.1 mg/dL (ref 1.7–2.4)

## 2020-06-06 LAB — COMPREHENSIVE METABOLIC PANEL
ALT: 30 U/L (ref 0–44)
AST: 194 U/L — ABNORMAL HIGH (ref 15–41)
Albumin: 2.3 g/dL — ABNORMAL LOW (ref 3.5–5.0)
Alkaline Phosphatase: 148 U/L — ABNORMAL HIGH (ref 38–126)
Anion gap: 8 (ref 5–15)
BUN: 19 mg/dL (ref 6–20)
CO2: 23 mmol/L (ref 22–32)
Calcium: 7.8 mg/dL — ABNORMAL LOW (ref 8.9–10.3)
Chloride: 117 mmol/L — ABNORMAL HIGH (ref 98–111)
Creatinine, Ser: 0.79 mg/dL (ref 0.44–1.00)
GFR, Estimated: >60 mL/min (ref >60)
Glucose, Bld: 109 mg/dL — ABNORMAL HIGH (ref 70–99)
Potassium: 3.5 mmol/L (ref 3.5–5.1)
Sodium: 148 mmol/L — ABNORMAL HIGH (ref 135–145)
Total Bilirubin: 3.3 mg/dL — ABNORMAL HIGH (ref 0.3–1.2)
Total Protein: 5 g/dL — ABNORMAL LOW (ref 6.5–8.1)

## 2020-06-06 LAB — ALBUMIN, PLEURAL OR PERITONEAL FLUID: Albumin, Fluid: <1 g/dL

## 2020-06-06 LAB — CBC
HCT: 26.1 % — ABNORMAL LOW (ref 36.0–46.0)
Hemoglobin: 8.3 g/dL — ABNORMAL LOW (ref 12.0–15.0)
MCH: 31.4 pg (ref 26.0–34.0)
MCHC: 31.8 g/dL (ref 30.0–36.0)
MCV: 98.9 fL (ref 80.0–100.0)
Platelets: 207 10*3/uL (ref 150–400)
RBC: 2.64 MIL/uL — ABNORMAL LOW (ref 3.87–5.11)
RDW: 25.2 % — ABNORMAL HIGH (ref 11.5–15.5)
WBC: 12.2 10*3/uL — ABNORMAL HIGH (ref 4.0–10.5)
nRBC: 0.2 % (ref 0.0–0.2)

## 2020-06-06 LAB — SURGICAL PATHOLOGY

## 2020-06-06 LAB — AMMONIA: Ammonia: 97 umol/L — ABNORMAL HIGH (ref 9–35)

## 2020-06-06 LAB — GRAM STAIN

## 2020-06-06 MED ORDER — SODIUM CHLORIDE 0.9 % IV SOLN
2.0000 g | Freq: Every day | INTRAVENOUS | Status: DC
  Administered 2020-06-07 – 2020-06-09 (×3): 2 g via INTRAVENOUS
  Filled 2020-06-06 (×3): qty 20

## 2020-06-06 MED ORDER — SODIUM CHLORIDE 0.9 % IV SOLN
1.0000 g | Freq: Once | INTRAVENOUS | Status: AC
  Administered 2020-06-06: 1 g via INTRAVENOUS
  Filled 2020-06-06: qty 10

## 2020-06-06 NOTE — Plan of Care (Signed)
  Problem: Education: Goal: Knowledge of General Education information will improve Description: Including pain rating scale, medication(s)/side effects and non-pharmacologic comfort measures Outcome: Progressing   Problem: Health Behavior/Discharge Planning: Goal: Ability to manage health-related needs will improve Outcome: Progressing   Problem: Clinical Measurements: Goal: Ability to maintain clinical measurements within normal limits will improve Outcome: Progressing Goal: Will remain free from infection Outcome: Progressing Goal: Diagnostic test results will improve Outcome: Progressing Goal: Respiratory complications will improve Outcome: Progressing Goal: Cardiovascular complication will be avoided Outcome: Progressing   Problem: Pain Managment: Goal: General experience of comfort will improve Outcome: Progressing   Problem: Safety: Goal: Ability to remain free from injury will improve Outcome: Progressing   Problem: Skin Integrity: Goal: Risk for impaired skin integrity will decrease Outcome: Progressing   

## 2020-06-06 NOTE — Progress Notes (Signed)
Palliative: [name] is lying quietly in bed. She, as usual, will keep her eyes closed. She is sleepy, but bedside nursing staff shares that they gave her Ativan this morning because of agitation.  Even so, I believe that she is able to make her basic needs known.  Her abdomen is tight but not tender.  There is no family at bedside at this time.  [name] went to IR for paracentesis. 3 liters of fluid removed, fluid sent for testing.      Call to niece, [name] who tells me that she will share our conversation with [name]'s mother, [name].  We talked about today's paracentesis, 3 L removed, sent for testing.  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  [name] states that [name] had her first paracentesis was while hospitalized, they removed just enough to test.  We talked about future paracentesis needs, "what if's and maybe's".  We talked about possibility of Pleurx drain in the future and hospice care.  Family seems to see [name] clearly and is able to state her chronic health issues and concerns.   [name] asks about discharge.  I shared that this remains to be seen, we have a new hospitalist today and would likely want to see how she does after this paracentesis.  She shares that the hospital bed was delivered, but only with half rails and they feel that she needs full bed rails.  I encouraged her to reach out to [employer] who delivered the bed.  She agrees.  Conference with attending, bedside nursing staff, GI, transition of care team related to patient condition, needs, goals of care, disposition.  Plan:   Continue to treat the treatable but no CPR or intubation.  Home with home health.  Outpatient palliative services to follow.  Anticipate need for transition to hospice type care within 3 to 6 months.  At this point, rehospitalize as needed.  45 minutes  Palliative Medicine Team  Team phone [phone] Greater than 50% of this time was spent counseling and  coordinating care related to the above assessment and plan.

## 2020-06-06 NOTE — Progress Notes (Signed)
Abdominal paracentesis completed with 3 L of yellow fluid removed. Cell count unremarkable.   Laureen Ochs. Bernarda Caffey [facility] [phone] 5/25/20221:34 PM

## 2020-06-06 NOTE — Care Management Important Message (Signed)
Important Message  Patient Details  Name: [name] MRN: [id number] Date of Birth: 1965-03-29   Medicare Important Message Given:  Yes     Tommy Medal 06/06/2020, 11:21 AM

## 2020-06-06 NOTE — Progress Notes (Signed)
PROGRESS NOTE    [name]  EAV:[id number] DOB: October 07, 1965 DOA: 06/02/2020 PCP: Caprice Renshaw, MD   Brief Narrative:   Monica Ledee Sheltonis a 55 y.o.femalewith medical history significant forCOPD, meningioma, cardiac arrest December 2020, hepatic steatosis,PNES, cognitive impairment, depression,andcomplex partial seizurewho presented to the ED after being found down at her skilled facility.  She was admitted with acute hepatic encephalopathy.  5/23 she did develop some hematemesis and has undergone EGD on 5/24 with findings of some mild esophagitis as well as gastritis.  She has been restarted on a diet with oral PPI.  Paracentesis ordered for today.  Family would like to take patient home with home health and outpatient palliative care once stable for discharge.  Assessment & Plan:   Active Problems:   Cirrhosis ([place])   Acute hepatic encephalopathy   UGI bleed   Acute hepatic encephalopathy -Continue on lactulose and monitor ammonia levels which are fluctuating -Uncertain if she has been taking her medications or not versus need for adjustment.  Hematemesis-resolved -Status post EGD 5/24 with findings of esophagitis and gastritis, but no active bleeding -Octreotide and protonix infusions to discontinue -Oral PPI BID -Monitor H/H and transfuse for H<7 -Soft diet restarted  AKI -improving -Likely prerenal -continue avoiding nephrotoxic agents and monitor intake and output -Recheck a.m. labs -encouraged adequate hydration   Lactic acidosis-stable -Likely related to dehydration and poor oral intake -Continue to monitor -maintain adequate hydration   MSSA bacteremia -was on Ancef through 5/29 to finish treatment; per GI rec's started on Rocephin which will cover SBP and MSSA. -no fever.  Urinary retention -Foley catheter in place from prior admission -No signs of UTI currently noted -Plan to attempt voiding trial once more awake and alert  Chronic  pain -Continue holding analgesics current regimen until more alert and awake  Liver cirrhosis -Some ascites for which paracentesis ordered -Elevated ammonia levels as noted below, on lactulose -Started on Rocephin per GI for SBP prophylaxis -So far unremarkable cell count from paracentesis done today.  Anemia of chronic disease-stable -Transfuse for hemoglobin less than 7 -No overt bleeding noted currently -Continue to follow hemoglobin trend.  Recurrent gait instability with frequent falls/dizziness -CT head with no acute findings -Patient to go home with home health  Complex partial seizures/PNES -Continue home Keppra and Lamictal -No seizure activity appreciated at this time.  Depression -Resume fluoxetine and lorazepam  Meningioma -Follows with Dr. [name] outpatient  Transaminitis -Appears to be a persistent problem in the setting of liver cirrhosis/NAFLD with hepatic congestion -Continue to monitor  Restless leg -Continue holding Mirapex due to prior confusion  DVT prophylaxis: Heparin to SCDs Code Status: DNR Family Communication: Discussed with family at bedside 5/24 Disposition Plan:  Status is: Inpatient  Remains inpatient appropriate because:Ongoing diagnostic testing needed not appropriate for outpatient work up, IV treatments appropriate due to intensity of illness or inability to take PO and Inpatient level of care appropriate due to severity of illness   Dispo: The patient is from: SNF  Anticipated d/c is to: Home with home health and outpt palliative  Patient currently is not medically stable to d/c yet.              Difficult to place patient No  Skin Assessment:  I have examined the patient's skin and I agree with the wound assessment as performed by the wound care RN as outlined below:  Pressure Injury 05/25/20 Sacrum Mid Stage 1 -  Intact skin with non-blanchable redness of a localized  area usually over a  bony prominence. (Active)  05/25/20 1746  Location: Sacrum  Location Orientation: Mid  Staging: Stage 1 -  Intact skin with non-blanchable redness of a localized area usually over a bony prominence.  Wound Description (Comments):   Present on Admission: No    Consultants:   GI  Palliative  Procedures:   See below  Antimicrobials:  Anti-infectives (From admission, onward)   Start     Dose/Rate Route Frequency Ordered Stop   06/07/20 1000  cefTRIAXone (ROCEPHIN) 2 g in sodium chloride 0.9 % 100 mL IVPB        2 g 200 mL/hr over 30 Minutes Intravenous Daily 06/06/20 1315 06/11/20 0959   06/06/20 1415  cefTRIAXone (ROCEPHIN) 1 g in sodium chloride 0.9 % 100 mL IVPB        1 g 200 mL/hr over 30 Minutes Intravenous  Once 06/06/20 1315 06/06/20 1402   05/23/2020 2200  rifaximin (XIFAXAN) tablet 550 mg        550 mg Oral 2 times daily 06/04/2020 1524     06/04/20 1330  cefTRIAXone (ROCEPHIN) 1 g in sodium chloride 0.9 % 100 mL IVPB  Status:  Discontinued        1 g 200 mL/hr over 30 Minutes Intravenous Daily 06/04/20 1320 06/06/20 1315   06/02/2020 1400  ceFAZolin (ANCEF) IVPB 2g/100 mL premix  Status:  Discontinued       Note to Pharmacy: Indication:  MSSA bacteremia  First Dose: Yes Last Day of Therapy:  Jun 10, 2020 Labs - Once weekly:  CBC/D and BMP, Labs - Every other week:  ESR and CRP Method of administration: IV Push Method of administration may be changed at the discretion of home infusion pharmacist based upon assessme   2 g 200 mL/hr over 30 Minutes Intravenous Every 8 hours 05/31/2020 1044 06/06/20 1315      Subjective: Confused, lethargic/sleepy but arousable.  No focal deficit.  No fever.  Objective: Vitals:   06/06/20 1145 06/06/20 1150 06/06/20 1600 06/06/20 1626  BP: 115/69  119/81   Pulse: 92 93 (!) 102 97  Resp: (!) 22 (!) 22 19 (!) 21  Temp:    98.9 F (37.2 C)  TempSrc:    Oral  SpO2: 100% 100% 98% 99%  Weight:      Height:        Intake/Output  Summary (Last 24 hours) at 06/06/2020 1828 Last data filed at 06/06/2020 1815 Gross per 24 hour  Intake 888.38 ml  Output 350 ml  Net 538.38 ml   Filed Weights   05/20/2020 0544  Weight: 63 kg    Examination: General exam: Afebrile, sleepy, lethargic and intermittently confused.  Wiggling on bed and without appropriate awareness for her own safety while reaching the edge of the bed on each side.  No chest pain, no nausea, no vomiting. Respiratory system: Good air movement bilaterally; no wheezing or crackles.  Positive scattered rhonchi. Cardiovascular system:RRR. No rubs or gallops. Gastrointestinal system: Abdomen is mildly distended, soft and with positive bowel sounds.  No tenderness elicited on palpation.  Status post 3 L of yellow fluid removed with paracentesis.   Central nervous system: Able to move 4 limbs spontaneously; no focal deficits. Extremities: No cyanosis or clubbing; no edema appreciated. Skin: No petechiae. Psychiatry: Unable to properly assess with current mentation; flat affect appreciated.    Data Reviewed: I have personally reviewed following labs and imaging studies  CBC: Recent Labs  Lab 05/30/2020 [phone]  06/04/20 0533 06/04/20 1532 06/04/20 2049 05/21/2020 0143 05/24/2020 0507 06/06/20 0455  WBC 8.7 9.1  --   --   --  9.0 12.2*  NEUTROABS 5.8  --   --   --   --   --   --   HGB 8.9* 9.0* 9.6* 8.9* 8.2* 8.0* 8.3*  HCT 27.4* 27.6* 30.2* 28.2* 25.5* 24.9* 26.1*  MCV 96.1 96.5  --   --   --  98.8 98.9  PLT 214 213  --   --   --  178 885   Basic Metabolic Panel: Recent Labs  Lab 05/18/2020 0605 06/04/20 0533 06/06/2020 0507 06/06/20 0455  NA 145 147* 145 148*  K 3.5 3.4* 3.5 3.5  CL 113* 114* 116* 117*  CO2 '23 25 22 23  ' GLUCOSE 106* 102* 115* 109*  BUN 39* 29* 23* 19  CREATININE 1.39* 1.01* 0.85 0.79  CALCIUM 7.7* 7.8* 7.3* 7.8*  MG  --  2.2 2.0 2.1   GFR: Estimated Creatinine Clearance: 66.6 mL/min (by C-G formula based on SCr of 0.79 mg/dL).    Liver Function Tests: Recent Labs  Lab 05/31/2020 0605 06/04/20 0533 05/28/2020 0507 06/06/20 0455  AST 196* 180* 175* 194*  ALT 46* 39 31 30  ALKPHOS 160* 155* 146* 148*  BILITOT 2.5* 2.6* 2.7* 3.3*  PROT 4.9* 5.2* 4.8* 5.0*  ALBUMIN 2.1* 2.3* 2.0* 2.3*   Recent Labs  Lab 05/25/2020 0605  LIPASE 111*   Recent Labs  Lab 05/15/2020 0702 06/04/20 0533 06/01/2020 0555 06/06/20 0455  AMMONIA 72* 55* 77* 97*   Coagulation Profile: Recent Labs  Lab 06/04/20 1403 05/22/2020 0748  INR 2.3* 1.5*   Sepsis Labs: Recent Labs  Lab 06/02/2020 0702 05/13/2020 0815 06/04/20 0533  LATICACIDVEN 1.6 2.0* 1.9    Recent Results (from the past 240 hour(s))  Culture, blood (routine x 2)     Status: None (Preliminary result)   Collection Time: 05/21/2020  8:12 AM   Specimen: Cath Site; Blood  Result Value Ref Range Status   Specimen Description CATH SITE BOTTLES DRAWN AEROBIC AND ANAEROBIC  Final   Special Requests Blood Culture adequate volume  Final   Culture   Final    NO GROWTH 3 DAYS Performed at [facility], [address] [place], [place] [address]    Report Status PENDING  Incomplete  Culture, blood (routine x 2)     Status: None (Preliminary result)   Collection Time: 05/14/2020  8:15 AM   Specimen: Left Antecubital; Blood  Result Value Ref Range Status   Specimen Description   Final    LEFT ANTECUBITAL BOTTLES DRAWN AEROBIC AND ANAEROBIC   Special Requests Blood Culture adequate volume  Final   Culture   Final    NO GROWTH 3 DAYS Performed at [facility], [address] [place], [place] [address]    Report Status PENDING  Incomplete  SARS CORONAVIRUS 2 (TAT 6-24 HRS) Nasopharyngeal Nasopharyngeal Swab     Status: None   Collection Time: 06/08/2020  8:48 AM   Specimen: Nasopharyngeal Swab  Result Value Ref Range Status   SARS Coronavirus 2 NEGATIVE NEGATIVE Final    Comment: (NOTE) SARS-CoV-2 target nucleic acids are NOT DETECTED.  The SARS-CoV-2 RNA is generally  detectable in upper and lower respiratory specimens during the acute phase of infection. Negative results do not preclude SARS-CoV-2 infection, do not rule out co-infections with other pathogens, and should not be used as the sole basis for treatment or  other patient management decisions. Negative results must be combined with clinical observations, patient history, and epidemiological information. The expected result is Negative.  Fact Sheet for Patients: SugarRoll.be  Fact Sheet for Healthcare Providers: https://www.woods-mathews.com/  This test is not yet approved or cleared by the [place] FDA and  has been authorized for detection and/or diagnosis of SARS-CoV-2 by FDA under an Emergency Use Authorization (EUA). This EUA will remain  in effect (meaning this test can be used) for the duration of the COVID-19 declaration under Se ction 564(b)(1) of the Act, 21 U.S.C. section 360bbb-3(b)(1), unless the authorization is terminated or revoked sooner.  Performed at [facility], [place] [address], [place], [place] [address]   MRSA PCR Screening     Status: None   Collection Time: 06/04/20  2:09 PM   Specimen: Nasal Mucosa; Nasopharyngeal  Result Value Ref Range Status   MRSA by PCR NEGATIVE NEGATIVE Final    Comment:        The GeneXpert MRSA Assay (FDA approved for NASAL specimens only), is one component of a comprehensive MRSA colonization surveillance program. It is not intended to diagnose MRSA infection nor to guide or monitor treatment for MRSA infections. Performed at [facility], [address] [place], [place] [address]   Culture, body fluid w Gram Stain-bottle     Status: None (Preliminary result)   Collection Time: 06/06/20 11:15 AM   Specimen: Peritoneal Washings  Result Value Ref Range Status   Specimen Description PERITONEAL  Final   Special Requests   Final    BOTTLES DRAWN AEROBIC AND ANAEROBIC  10CC Performed at [facility], [address], [place], [place] [address]    Culture PENDING  Incomplete   Report Status PENDING  Incomplete  Gram stain     Status: None   Collection Time: 06/06/20 11:15 AM   Specimen: Peritoneal Washings  Result Value Ref Range Status   Specimen Description PERITONEAL  Final   Special Requests NONE  Final   Gram Stain   Final    CYTOSPIN SMEAR NO ORGANISMS SEEN WBC PRESENT, PREDOMINANTLY MONONUCLEAR Performed at [facility], [address], [place], [place] [address]    Report Status 06/06/2020 FINAL  Final     Radiology Studies: US Paracentesis  Result Date: 06/06/2020 INDICATION: Ascites EXAM: ULTRASOUND GUIDED DIAGNOSTIC AND THERAPEUTIC PARACENTESIS MEDICATIONS: 10 cc 1% lidocaine COMPLICATIONS: None immediate. PROCEDURE: Informed written consent was obtained from the patient after a discussion of the risks, benefits and alternatives to treatment. A timeout was performed prior to the initiation of the procedure. Initial ultrasound scanning demonstrates a large amount of ascites within the right lower abdominal quadrant. The right lower abdomen was prepped and draped in the usual sterile fashion. 1% lidocaine was used for local anesthesia. Following this, a Yueh catheter was introduced. An ultrasound image was saved for documentation purposes. The paracentesis was performed. The catheter was removed and a dressing was applied. The patient tolerated the procedure well without immediate post procedural complication. Patient received post-procedure intravenous albumin; see nursing notes for details. FINDINGS: A total of approximately 3 liters of yellow fluid was removed. Samples were sent to the laboratory as requested by the clinical team. IMPRESSION: Successful ultrasound-guided paracentesis yielding 3 liters of peritoneal fluid. Read by Lavonia Drafts [facility] Electronically Signed   By: Lavonia Dana M.D.   On: 06/06/2020 12:01   [name] ASCITES (ABDOMEN  LIMITED)  Result Date: 05/14/2020 CLINICAL DATA:  Cirrhosis, ascites EXAM: LIMITED ABDOMEN ULTRASOUND  FOR ASCITES TECHNIQUE: Limited ultrasound survey for ascites was performed in all four abdominal quadrants. COMPARISON:  05/14/2020 FINDINGS: Small volume ascites identified in the lower quadrants bilaterally. More ascites is seen at the midline at the pelvis. Volume of ascites is sufficient for diagnostic paracentesis. IMPRESSION: Sufficient ascites for diagnostic paracentesis. Electronically Signed   By: Lavonia Dana M.D.   On: 05/18/2020 16:16    Scheduled Meds: . Chlorhexidine Gluconate Cloth  6 each Topical Daily  . FLUoxetine  80 mg Oral Daily  . folic acid  1 mg Oral Daily  . lactulose  20 g Oral TID  . lamoTRIgine  150 mg Oral BID  . levETIRAcetam  1,000 mg Oral BID  . pantoprazole  40 mg Oral BID  . rifaximin  550 mg Oral BID   Continuous Infusions: . [START ON 06/07/2020] cefTRIAXone (ROCEPHIN)  IV    . lactated ringers Stopped (06/04/20 1435)     LOS: 3 days    Time spent: 35 minutes    Barton Dubois, MD [facility]  If 7PM-7AM, please contact night-coverage [email or web address] 06/06/2020, 6:28 PM

## 2020-06-06 NOTE — Progress Notes (Signed)
Subjective:  Resting. Did not want to eat this morning. Nursing reports no vomiting or melena.   Objective: Vital signs in last 24 hours: Temp:  [97.5 F (36.4 C)-98.3 F (36.8 C)] 98.2 F (36.8 C) (05/25 1100) Pulse Rate:  [94-107] 94 (05/25 1130) Resp:  [15-27] 22 (05/25 1130) BP: (114-153)/(75-103) 114/80 (05/25 1130) SpO2:  [98 %-100 %] 100 % (05/25 1130) Last BM Date: 05/27/2020 General:  Chronically ill appearing, appears older than states age. NAD. Pale. Head:  Normocephalic and atraumatic. Eyes:  Sclera clear, no icterus.  Abdomen:  Soft, distended in upper abdomen. Less distended in lower abdominal. Firm upper abd. Normal bowel sounds, without guarding, and without rebound.   Extremities:  Without clubbing, deformity or edema. Neurologic: asleep, did not awaken. t.  Intake/Output from previous day: 05/24 0701 - 05/25 0700 In: 850 [P.O.:150; I.V.:400; IV Piggyback:300] Out: 700 [Urine:700] Intake/Output this shift: Total I/O In: 438.4 [I.V.:251.7; IV Piggyback:186.7] Out: -   Lab Results: CBC Recent Labs    06/04/20 0533 06/04/20 1532 05/31/2020 0143 05/28/2020 0507 06/06/20 0455  WBC 9.1  --   --  9.0 12.2*  HGB 9.0*   < > 8.2* 8.0* 8.3*  HCT 27.6*   < > 25.5* 24.9* 26.1*  MCV 96.5  --   --  98.8 98.9  PLT 213  --   --  178 207   < > = values in this interval not displayed.   BMET Recent Labs    06/04/20 0533 06/08/2020 0507 06/06/20 0455  NA 147* 145 148*  K 3.4* 3.5 3.5  CL 114* 116* 117*  CO2 25 22 23   GLUCOSE 102* 115* 109*  BUN 29* 23* 19  CREATININE 1.01* 0.85 0.79  CALCIUM 7.8* 7.3* 7.8*   LFTs Recent Labs    06/04/20 0533 06/11/2020 0507 06/06/20 0455  BILITOT 2.6* 2.7* 3.3*  ALKPHOS 155* 146* 148*  AST 180* 175* 194*  ALT 39 31 30  PROT 5.2* 4.8* 5.0*  ALBUMIN 2.3* 2.0* 2.3*   No results for input(s): LIPASE in the last 72 hours. PT/INR Recent Labs    06/04/20 1403 05/18/2020 0748  LABPROT 25.1* 18.4*  INR 2.3* 1.5*       Imaging Studies: CT Head Wo Contrast  Result Date: 05/22/2020 CLINICAL DATA:  Head trauma. Found on floor by staff. Baseline altered mental status. EXAM: CT HEAD WITHOUT CONTRAST TECHNIQUE: Contiguous axial images were obtained from the base of the skull through the vertex without intravenous contrast. COMPARISON:  CT head 05/30/2018 FINDINGS: Brain: No evidence of large-territorial acute infarction. No parenchymal hemorrhage. No mass lesion. No extra-axial collection. No mass effect or midline shift. No hydrocephalus. Basilar cisterns are patent. Vascular: No hyperdense vessel. Skull: No acute fracture or focal lesion. Sinuses/Orbits: Paranasal sinuses and mastoid air cells are clear. The orbits are unremarkable. Other: None. IMPRESSION: No acute intracranial abnormality. Electronically Signed   By: Iven Finn M.D.   On: 06/02/2020 06:40   CT Head Wo Contrast  Result Date: 05/29/2020 CLINICAL DATA:  Head injury, Unwitnessed fall, altered mental status EXAM: CT HEAD WITHOUT CONTRAST CT CERVICAL SPINE WITHOUT CONTRAST TECHNIQUE: Multidetector CT imaging of the head and cervical spine was performed following the standard protocol without intravenous contrast. Multiplanar CT image reconstructions of the cervical spine were also generated. COMPARISON:  None. FINDINGS: CT HEAD FINDINGS Brain: Normal anatomic configuration. No abnormal intra or extra-axial mass lesion or fluid collection. No abnormal mass effect or midline shift. No evidence of acute  intracranial hemorrhage or infarct. Ventricular size is normal. Cerebellum unremarkable. Vascular: Unremarkable Skull: Intact Sinuses/Orbits: Paranasal sinuses are clear. Orbits are unremarkable. Other: There is hypoplasia of the mastoid air cells bilaterally. Middle ear cavities are clear. CT CERVICAL SPINE FINDINGS Alignment: Accentuated cervical lordosis is likely positional in nature. No listhesis. Skull base and vertebrae: The craniocervical junction is  unremarkable. The atlantodental interval is normal. No acute fracture of the cervical spine. Soft tissues and spinal canal: No prevertebral fluid or swelling. No visible canal hematoma. Disc levels: There is intervertebral disc space narrowing and endplate remodeling of T7-G0, most severe at C6-7 in keeping with changes of mild to moderate degenerative disc disease. There are posterior disc herniations noted at C2-3 and C3-4 which mildly effaces the anterior canal space. Eccentric posterior disc osteophyte complex, asymmetrically more severe on the left, results in abutment of the thecal sac without remodeling. Multilevel uncovertebral and facet arthrosis results in multilevel neuroforaminal narrowing, most severe bilaterally at C6-7. Upper chest: Mild biapical scarring is present. Other: Right upper extremity PICC line is partially visualized. IMPRESSION: No acute intracranial abnormality.  No calvarial fracture. No acute fracture or listhesis of the cervical spine. Multilevel degenerative disc and degenerative joint disease, most severe at C6-7 Electronically Signed   By: Fidela Salisbury MD   On: 05/29/2020 05:02   CT HEAD WO CONTRAST  Result Date: 05/24/2020 CLINICAL DATA:  55 year old female with altered mental status. EXAM: CT HEAD WITHOUT CONTRAST TECHNIQUE: Contiguous axial images were obtained from the base of the skull through the vertex without intravenous contrast. COMPARISON:  Head CT dated 05/12/2020. FINDINGS: Evaluation of this exam is limited due to motion artifact. Brain: The ventricles and sulci appropriate size for patient's age. The gray-white matter discrimination is preserved. There is no acute intracranial hemorrhage. No mass effect or midline shift. No extra-axial fluid collection. Vascular: No hyperdense vessel or unexpected calcification. Skull: Normal. Negative for fracture or focal lesion. Sinuses/Orbits: No acute finding. Other: Mild left forehead contusion. IMPRESSION: No acute  intracranial pathology. Electronically Signed   By: Anner Crete M.D.   On: 05/24/2020 02:17   CT HEAD WO CONTRAST  Result Date: 05/13/2020 CLINICAL DATA:  Fall 2 days prior EXAM: CT HEAD WITHOUT CONTRAST TECHNIQUE: Contiguous axial images were obtained from the base of the skull through the vertex without intravenous contrast. COMPARISON:  CT 02/20/2020 FINDINGS: Brain: No evidence of acute infarction, hemorrhage, hydrocephalus, extra-axial collection, visible mass lesion or mass effect. Vascular: Atherosclerotic calcification of the carotid siphons. No hyperdense vessel. Skull: No calvarial fracture or suspicious osseous lesion. No scalp swelling or hematoma. Sinuses/Orbits: Paranasal sinuses are predominantly clear. Mastoid air cells appear chronically hypo pneumatized. Middle ear cavities are clear. Debris in the bilateral external auditory canals. Included orbital structures are unremarkable. Other: Asymmetric thickening and calcification in the left parotid gland is a chronic, nonspecific finding, similar to comparison study. IMPRESSION: No acute intracranial abnormality. No calvarial fracture, significant scalp swelling or hematoma. Intracranial atherosclerosis. Asymmetric thickening and calcification in the left parotid gland, a nonspecific, chronic finding unchanged from prior. Can reflect sequela of a chronic inflammatory process. Debris in the external auditory canals, correlate for cerumen impaction. Electronically Signed   By: Lovena Le M.D.   On: 05/13/2020 00:44   CT Cervical Spine Wo Contrast  Result Date: 05/29/2020 CLINICAL DATA:  Head injury, Unwitnessed fall, altered mental status EXAM: CT HEAD WITHOUT CONTRAST CT CERVICAL SPINE WITHOUT CONTRAST TECHNIQUE: Multidetector CT imaging of the head and cervical spine was  performed following the standard protocol without intravenous contrast. Multiplanar CT image reconstructions of the cervical spine were also generated. COMPARISON:  None.  FINDINGS: CT HEAD FINDINGS Brain: Normal anatomic configuration. No abnormal intra or extra-axial mass lesion or fluid collection. No abnormal mass effect or midline shift. No evidence of acute intracranial hemorrhage or infarct. Ventricular size is normal. Cerebellum unremarkable. Vascular: Unremarkable Skull: Intact Sinuses/Orbits: Paranasal sinuses are clear. Orbits are unremarkable. Other: There is hypoplasia of the mastoid air cells bilaterally. Middle ear cavities are clear. CT CERVICAL SPINE FINDINGS Alignment: Accentuated cervical lordosis is likely positional in nature. No listhesis. Skull base and vertebrae: The craniocervical junction is unremarkable. The atlantodental interval is normal. No acute fracture of the cervical spine. Soft tissues and spinal canal: No prevertebral fluid or swelling. No visible canal hematoma. Disc levels: There is intervertebral disc space narrowing and endplate remodeling of M3-N3, most severe at C6-7 in keeping with changes of mild to moderate degenerative disc disease. There are posterior disc herniations noted at C2-3 and C3-4 which mildly effaces the anterior canal space. Eccentric posterior disc osteophyte complex, asymmetrically more severe on the left, results in abutment of the thecal sac without remodeling. Multilevel uncovertebral and facet arthrosis results in multilevel neuroforaminal narrowing, most severe bilaterally at C6-7. Upper chest: Mild biapical scarring is present. Other: Right upper extremity PICC line is partially visualized. IMPRESSION: No acute intracranial abnormality.  No calvarial fracture. No acute fracture or listhesis of the cervical spine. Multilevel degenerative disc and degenerative joint disease, most severe at C6-7 Electronically Signed   By: Fidela Salisbury MD   On: 05/29/2020 05:02   CT ABDOMEN PELVIS W CONTRAST  Result Date: 05/13/2020 CLINICAL DATA:  Abdominal pain, fever, right upper quadrant pain EXAM: CT ABDOMEN AND PELVIS WITH  CONTRAST TECHNIQUE: Multidetector CT imaging of the abdomen and pelvis was performed using the standard protocol following bolus administration of intravenous contrast. CONTRAST:  128mL OMNIPAQUE IOHEXOL 300 MG/ML  SOLN COMPARISON:  CT 02/12/2020 FINDINGS: Lower chest: Basilar atelectatic changes in the lungs including more bandlike areas of subsegmental atelectasis or scarring. Normal heart size. No pericardial effusion. Hepatobiliary: Heterogeneous hepatic attenuation with a nodular liver surface contour concerning for intrinsic liver disease/cirrhosis. No concerning focal liver lesion. Moderate gallbladder distension. No significant gallbladder wall thickening or pericholecystic inflammation is seen. No visible calcified gallstones or biliary ductal dilatation. Pancreas: Slightly edematous appearance of the pancreatic parenchyma with very faint peripancreatic haze, uniform enhancement. No pancreatic ductal dilatation. Spleen: Mild splenomegaly.  No concerning focal splenic lesion. Adrenals/Urinary Tract: Normal adrenals. Kidneys enhance symmetrically. Symmetric and uniform excretion. No concerning focal renal lesion. Bilateral renal cortical scarring. Nonobstructing calculi present in both kidneys. No obstructive urolithiasis or hydronephrosis is seen at this time. Bladder is unremarkable for the degree of distention. Stomach/Bowel: Paraesophageal venous collaterals and varices. Some minimal collateralization and varices about the gastric fundus as well. Thickening towards the gastric antrum likely reflecting normal peristalsis. Air and fluid-filled appearance of the duodenum with a normal sweep across the midline abdomen. Slightly edematous appearance diffusely through the small bowel is nonspecific given additional findings above. Moderate right-sided colonic stool burden, particularly within the cecum. Some mild circumferential thickening and mucosal hyperemia about the cecum and ascending colon is nonspecific  given hepatic features. No evidence of mechanical obstruction. Appendix is not well visualized. Vascular/Lymphatic: Atherosclerotic calcifications within the abdominal aorta and branch vessels. No aneurysm or ectasia. Paraesophageal and gastric varices with venous collaterals, as above. Splenorenal collateralization. Upper abdominal varices.No enlarged  abdominopelvic lymph nodes. Few scattered calcified lymph nodes are present in the abdomen and pelvis. Reproductive: Portion the pelvis obscured by streak artifact. 2.6 cm cystic focus in the left adnexa, unchanged from comparison. No concerning right adnexal lesion. Prior hysterectomy. Other: Increasing edematous changes of the central mesentery. Small volume low-attenuation fluid in the deep pelvis as well as trace perihepatic ascites. Circumferential body wall edema. No bowel containing hernia. No free abdominopelvic air. Musculoskeletal: Multilevel degenerative changes are present in the imaged portions of the spine. Grade 1 anterolisthesis L4 on L5 without spondylolysis, unchanged from prior. Prior left hip arthroplasty with chronic bony remodeling and a similar appearance overall to comparison prior. IMPRESSION: Constellation of features suggestive of intrinsic liver disease/cirrhosis with features of portal hypertension including a nodular, heterogeneous liver with paraesophageal and gastric varices, additional upper abdominal venous collateralization, splenorenal shunting and splenomegaly. Interval development of a small volume ascites as well as additional features of at least mild or early developing anasarca with circumferential body wall edema and central mesenteric edema. Some hazy peripancreatic stranding may be present, possibly related to a diffusely edematous appearance of the mesentery though could correlate with lipase. Mildly thickened appearance of the cecum and ascending colon as well as a diffusely edematous appearance of the small bowel is  nonspecific in the setting of suspected intrinsic liver disease. Could reflect portal enteropathy/colopathy though should exclude clinical symptoms of enterocolitis. Bilateral nonobstructing nephrolithiasis. No obstructive urolithiasis or hydronephrosis at this time. Bilateral renal cortical scarring. Prior hysterectomy. Stable 2.7 cm left ovarian cyst. Continued stability favoring a benign process. No follow-up imaging recommended. Note: This recommendation does not apply to premenarchal patients and to those with increased risk (genetic, family history, elevated tumor markers or other high-risk factors) of ovarian cancer. Reference: JACR 2020 Feb; 17(2):248-254 Aortic Atherosclerosis (ICD10-I70.0). Electronically Signed   By: Lovena Le M.D.   On: 05/13/2020 00:35   US Abdomen Limited  Result Date: 05/13/2020 CLINICAL DATA:  Right upper quadrant pain. EXAM: ULTRASOUND ABDOMEN LIMITED RIGHT UPPER QUADRANT COMPARISON:  February 23, 2020 ultrasound.  CT scan May 12, 2020. FINDINGS: Gallbladder: A small amount of sludge is seen in the gallbladder. No stones, Murphy's sign, or wall thickening. A small amount of ascites is identified, including adjacent to the gallbladder. Common bile duct: Diameter: 5 mm Liver: There is a nodular contour in the liver. No focal mass. Portal vein is patent on color Doppler imaging with normal direction of blood flow towards the liver. Other: Mild ascites. IMPRESSION: 1. Nodular contour to the liver suggesting cirrhosis.  Mild ascites. 2. Mild sludge in otherwise normal appearing gallbladder. A small amount of fluid adjacent to the gallbladder is likely due to the ascites. Electronically Signed   By: Dorise Bullion III M.D   On: 05/13/2020 09:06   US Paracentesis  Result Date: 05/14/2020 INDICATION: Fever, abdominal pain, question spontaneous bacterial peritonitis, ascites on CT EXAM: ULTRASOUND GUIDED DIAGNOSTIC PARACENTESIS MEDICATIONS: None COMPLICATIONS: None immediate  PROCEDURE: Informed written consent was obtained from the patient after a discussion of the risks, benefits and alternatives to treatment. A timeout was performed prior to the initiation of the procedure. Initial ultrasound scanning demonstrates a small amount of ascites within the right lower abdominal quadrant. The right lower abdomen was prepped and draped in the usual sterile fashion. 1% lidocaine was used for local anesthesia. Following this, a 5 [place] Yueh catheter was introduced. An ultrasound image was saved for documentation purposes. The paracentesis was performed. The catheter was removed and  a dressing was applied. The patient tolerated the procedure well without immediate post procedural complication. FINDINGS: A total of approximately 20 mL of clear yellow ascitic fluid was removed. Samples were sent to the laboratory as requested by the clinical team. IMPRESSION: Successful ultrasound-guided paracentesis yielding 20 mL of peritoneal fluid for diagnostic testing. Electronically Signed   By: Lavonia Dana M.D.   On: 05/14/2020 12:39   DG CHEST PORT 1 VIEW  Result Date: 06/04/2020 CLINICAL DATA:  PICC line placement EXAM: PORTABLE CHEST 1 VIEW COMPARISON:  04/03/2020 FINDINGS: Right upper DVT PICC now terminates in the region of the right subclavian artery. Cardiomediastinal silhouette and pulmonary vasculature within normal limits. Diffuse bilateral airspace opacities similar to prior examination. Hyperdense content of the colon likely related to barium utilized during swallow study. IMPRESSION: Right upper extremity PICC terminates in the region of the right subclavian vein. Unchanged diffuse bilateral airspace opacities. These results will be called to the ordering clinician or representative by the Radiologist Assistant, and communication documented in the PACS or [employer]. Electronically Signed   By: Miachel Roux M.D.   On: 06/04/2020 15:56   DG Chest Port 1 View  Result Date:  06/02/2020 CLINICAL DATA:  55 year old female found down.  Weakness. EXAM: PORTABLE CHEST 1 VIEW COMPARISON:  Portable chest 05/24/2020 and earlier. Modified barium swallow 05/25/2020. FINDINGS: Residual barium mixed with retained stool in the upper abdominal large bowel. Continued low lung volumes. Stable right PICC line. Mediastinal contours remain normal. Diffuse coarse bilateral pulmonary opacity appears to be chronic and stable over this year's series of exams. No superimposed pneumothorax, pleural effusion or confluent pulmonary opacity. Visualized tracheal air column is within normal limits. No acute osseous abnormality identified. IMPRESSION: 1. Continued low lung volumes and coarse bilateral pulmonary interstitial markings which do not appear significantly changed since January. Favor a chronic interstitial lung disease over an acute pulmonary process. 2. PICC line remains in place. 3. Residual barium from a modified swallow 9 days ago mixed with residual stool in the colon. Electronically Signed   By: Genevie Ann M.D.   On: 05/13/2020 07:27   DG CHEST PORT 1 VIEW  Result Date: 05/24/2020 CLINICAL DATA:  55 year old female with tachycardia. EXAM: PORTABLE CHEST 1 VIEW COMPARISON:  Chest radiograph dated 05/22/2020. FINDINGS: Right-sided PICC with tip at the cavoatrial junction. No interval change in bilateral interstitial reticulonodular densities. No pleural effusion or pneumothorax. Stable cardiomediastinal silhouette. No acute osseous pathology. IMPRESSION: No interval change in the bilateral interstitial reticulonodular densities. Electronically Signed   By: Anner Crete M.D.   On: 05/24/2020 01:33   DG CHEST PORT 1 VIEW  Result Date: 05/22/2020 CLINICAL DATA:  Cough EXAM: PORTABLE CHEST 1 VIEW COMPARISON:  Chest radiograph dated 05/20/2020. FINDINGS: The heart size and mediastinal contours are within normal limits. Moderate bilateral interstitial and airspace opacities appear unchanged since  prior exam. A right upper extremity peripherally inserted central venous catheter tip overlies the superior cavoatrial junction. IMPRESSION: Moderate bilateral interstitial and airspace opacities are not significantly changed and likely represent pulmonary edema. Electronically Signed   By: Zerita Boers M.D.   On: 05/22/2020 16:46   DG CHEST PORT 1 VIEW  Result Date: 05/20/2020 CLINICAL DATA:  Pulmonary edema EXAM: PORTABLE CHEST 1 VIEW COMPARISON:  05/16/2020 chest radiograph. FINDINGS: Right PICC terminates over the right atrium. Stable cardiomediastinal silhouette with normal heart size. No pneumothorax. No pleural effusion. Patchy hazy opacities throughout both lungs, significantly improved. IMPRESSION: Significantly improved patchy hazy opacities throughout  both lungs, compatible with improving pulmonary edema. Electronically Signed   By: Ilona Sorrel M.D.   On: 05/20/2020 07:32   DG CHEST PORT 1 VIEW  Result Date: 05/16/2020 CLINICAL DATA:  Shortness of breath. EXAM: PORTABLE CHEST 1 VIEW COMPARISON:  Chest x-ray dated May 12, 2020. FINDINGS: The heart size and mediastinal contours are within normal limits. Prominent diffuse interstitial thickening with bilateral perihilar opacities. Small bilateral pleural effusions. No pneumothorax. No acute osseous abnormality. IMPRESSION: 1. Moderate to severe pulmonary edema with small bilateral pleural effusions. Electronically Signed   By: Titus Dubin M.D.   On: 05/16/2020 12:57   DG Chest Portable 1 View  Result Date: 05/12/2020 CLINICAL DATA:  Right sided rib pain. EXAM: PORTABLE CHEST 1 VIEW COMPARISON:  February 12, 2020 FINDINGS: A very mild amount of linear atelectasis is seen within the right lung base. There is no evidence of acute infiltrate, pleural effusion or pneumothorax. The heart size and mediastinal contours are within normal limits. A chronic seventh right rib fracture is seen. IMPRESSION: 1. Very mild right basilar linear atelectasis.  2. Chronic seventh right rib fracture. Electronically Signed   By: Virgina Norfolk M.D.   On: 05/12/2020 23:16   DG Swallowing Func-Speech Pathology  Result Date: 05/25/2020 Objective Swallowing Evaluation: Type of Study: MBS-Modified Barium Swallow Study  Patient Details Name: [name] MRN: [id number] Date of Birth: Jul 31, 1965 Today's Date: 05/25/2020 Time: SLP Start Time (ACUTE ONLY): 1436 -SLP Stop Time (ACUTE ONLY): 1505 SLP Time Calculation (min) (ACUTE ONLY): 29 min Past Medical History: Past Medical History: Diagnosis Date . Arthritis   "knees" (04/22/2012) . Brain tumor (benign) ([place])  . Chronic bronchitis ([place])   "yearly; when the weather changes" (04/22/2012) . Colon polyp  . Colon polyps   adenomatous and hyperplastic- . Depression  . Eczema  . Epilepsy ([place])   "been having them right often here lately" (04/22/2012) . Fatty liver  . High cholesterol  . History of kidney stones  . Osteoarthritis   [name] 04/22/2012 . Other convulsions 05/21/12  non-epileptic spells . Restless leg  . Seizures ([place])  . Vertigo  Past Surgical History: Past Surgical History: Procedure Laterality Date . ABDOMINAL HYSTERECTOMY  2001 . BLADDER SUSPENSION   . BUNIONECTOMY Left 2000 . CESAREAN SECTION  1987; 1988 . COLONOSCOPY   . JOINT REPLACEMENT   . MASS EXCISION  10/22/2011  Procedure: EXCISION MASS;  Surgeon: [name], MD;  Location: [facility];  Service: General;  Laterality: Right;  excision right buttock mass . TEE WITHOUT CARDIOVERSION N/A 05/16/2020  Procedure: TRANSESOPHAGEAL ECHOCARDIOGRAM (TEE) WITH PROPOFOL;  Surgeon: Arnoldo Lenis, MD;  Location: [facility];  Service: Endoscopy;  Laterality: N/A; . TOTAL HIP ARTHROPLASTY Left 1993; 1995; 2000 . TOTAL KNEE ARTHROPLASTY Left 05/07/2015  Procedure: TOTAL LEFT KNEE ARTHROPLASTY;  Surgeon: [name], MD;  Location: [facility];  Service: Orthopedics;  Laterality: Left; . TOTAL KNEE ARTHROPLASTY Right 10/01/2015  Procedure: RIGHT TOTAL KNEE ARTHROPLASTY;  Surgeon: [name], MD;  Location: [facility];  Service: Orthopedics;  Laterality: Right; HPI: 55 year old female with a history of COPD, meningioma, cardiac arrest December 2020, hepatic steatosis, PNES, cognitive impairment, depression, complex partial seizure presenting with generalized weakness and a fall.  This has been a recurrent problem for the patient.  She was admitted on 2/7 to 02/24/20 with a similar presentation.  At that time she was treated for acute metabolic encephalopathy which was thought to be multifactorial including elevated ammonia, dehydration and dopamine agonist.  BSE requested  No data recorded Assessment / Plan / Recommendation CHL IP CLINICAL IMPRESSIONS 05/25/2020 Clinical Impression Pt presents with moderate oropharyngeal dysphagia characterized by silent aspiration of thin liquids; amount of aspiration is dependent on presentation type and bolus size. Pt demonstrates premature spillage, decreased epiglottic deflection, decreased laryngeal vestibule closure and decreased pharyngeal squeeze. Tsp sips of thin liquids result in trace flash penetration, cup sips result in penetration with trace amounts falling to the cords that are not always visualized being cleared from the laryngeal vestibule; straw sips of thin result in moderate amounts falling to the cords and eventually being aspirated. Pt also demonstrated a moderate amount of aspiration of a cup sip of thin with the barium tablet administration (this unfortunately occurred prior to floro being turned on, however evidence of aspiration present on the tracheal wall); All aspiration was silent and required verbal cues for Pt to cough. Further, cough was inconsitently effective in clearing aspirates. With NTL Pt demonstrated occasional flash penetration and puree and regular textures were consumed without incident. Recommend continue with a D2/fine chop diet and NTL. Pt is cognitively unable to consistently utilize strategies. SLP will continue to  follow acutely SLP Visit Diagnosis Dysphagia, unspecified (R13.10) Attention and concentration deficit following -- Frontal lobe and executive function deficit following -- Impact on safety and function Mild aspiration risk;Moderate aspiration risk   [facility]L IP TREATMENT RECOMMENDATION 05/25/2020 Treatment Recommendations F/U MBS in --- days (Comment);Therapy as outlined in treatment plan below   Prognosis 05/25/2020 Prognosis for Safe Diet Advancement Good Barriers to Reach Goals Cognitive deficits Barriers/Prognosis Comment -- [facility] IP DIET RECOMMENDATION 05/25/2020 SLP Diet Recommendations Dysphagia 2 (Fine chop) solids;Nectar thick liquid Liquid Administration via Cup;Straw Medication Administration Whole meds with puree Compensations Minimize environmental distractions;Slow rate;Small sips/bites Postural Changes Seated upright at 90 degrees   CHL IP OTHER RECOMMENDATIONS 05/25/2020 Recommended Consults -- Oral Care Recommendations Oral care BID Other Recommendations Order thickener from pharmacy   CHL IP FOLLOW UP RECOMMENDATIONS 05/25/2020 Follow up Recommendations 24 hour supervision/assistance   [facility]L IP FREQUENCY AND DURATION 05/25/2020 Speech Therapy Frequency (ACUTE ONLY) min 1 x/week Treatment Duration 1 week      [facility]L IP ORAL PHASE 05/25/2020 Oral Phase Impaired Oral - Pudding Teaspoon -- Oral - Pudding Cup -- Oral - Honey Teaspoon -- Oral - Honey Cup -- Oral - Nectar Teaspoon Premature spillage;Decreased bolus cohesion;Piecemeal swallowing Oral - Nectar Cup Premature spillage;Decreased bolus cohesion;Piecemeal swallowing Oral - Nectar Straw NT Oral - Thin Teaspoon Decreased bolus cohesion;Premature spillage;Piecemeal swallowing Oral - Thin Cup Premature spillage;Decreased bolus cohesion;Piecemeal swallowing Oral - Thin Straw Premature spillage;Decreased bolus cohesion;Piecemeal swallowing Oral - Puree WFL Oral - Mech Soft -- Oral - Regular Piecemeal swallowing Oral - Multi-Consistency -- Oral - Pill (No Data) Oral  Phase - Comment --  CHL IP PHARYNGEAL PHASE 05/25/2020 Pharyngeal Phase Impaired Pharyngeal- Pudding Teaspoon -- Pharyngeal -- Pharyngeal- Pudding Cup -- Pharyngeal -- Pharyngeal- Honey Teaspoon -- Pharyngeal -- Pharyngeal- Honey Cup -- Pharyngeal -- Pharyngeal- Nectar Teaspoon NT Pharyngeal -- Pharyngeal- Nectar Cup Pharyngeal residue - valleculae;Pharyngeal residue - pyriform;Penetration/Aspiration during swallow Pharyngeal Material enters airway, remains ABOVE vocal cords then ejected out Pharyngeal- Nectar Straw NT Pharyngeal -- Pharyngeal- Thin Teaspoon Pharyngeal residue - valleculae;Pharyngeal residue - pyriform;Penetration/Aspiration during swallow;Penetration/Aspiration before swallow;Trace aspiration;Reduced epiglottic inversion;Delayed swallow initiation-pyriform sinuses;Reduced pharyngeal peristalsis;Reduced airway/laryngeal closure Pharyngeal Material enters airway, passes BELOW cords without attempt by patient to eject out (silent aspiration) Pharyngeal- Thin Cup Pharyngeal residue - valleculae;Pharyngeal residue - pyriform;Penetration/Aspiration during swallow;Penetration/Aspiration before  swallow;Trace aspiration;Reduced epiglottic inversion;Delayed swallow initiation-pyriform sinuses;Reduced pharyngeal peristalsis;Reduced airway/laryngeal closure Pharyngeal Material enters airway, passes BELOW cords without attempt by patient to eject out (silent aspiration) Pharyngeal- Thin Straw Pharyngeal residue - valleculae;Pharyngeal residue - pyriform;Penetration/Aspiration during swallow;Penetration/Aspiration before swallow;Reduced epiglottic inversion;Delayed swallow initiation-pyriform sinuses;Reduced pharyngeal peristalsis;Reduced airway/laryngeal closure;Moderate aspiration Pharyngeal Material enters airway, passes BELOW cords without attempt by patient to eject out (silent aspiration) Pharyngeal- Puree Pharyngeal residue - valleculae Pharyngeal -- Pharyngeal- Mechanical Soft NT Pharyngeal -- Pharyngeal-  Regular Pharyngeal residue - valleculae Pharyngeal -- Pharyngeal- Multi-consistency -- Pharyngeal -- Pharyngeal- Pill -- Pharyngeal -- Pharyngeal Comment --  CHL IP CERVICAL ESOPHAGEAL PHASE 05/25/2020 Cervical Esophageal Phase WFL Pudding Teaspoon -- Pudding Cup -- Honey Teaspoon -- Honey Cup -- Nectar Teaspoon -- Nectar Cup -- Nectar Straw -- Thin Teaspoon -- Thin Cup -- Thin Straw -- Puree -- Mechanical Soft -- Regular -- Multi-consistency -- Pill -- Cervical Esophageal Comment -- Amelia H. Roddie Mc, CCC-SLP Speech Language Pathologist Wende Bushy 05/25/2020, 4:05 PM              ECHOCARDIOGRAM COMPLETE  Result Date: 05/15/2020    ECHOCARDIOGRAM REPORT   Patient Name:   [name] Date of Exam: 05/15/2020 Medical Rec #:  [id number]       Height:       60.0 in Accession #:    [id number]      Weight:       127.9 lb Date of Birth:  [birth year]        BSA:          1.543 m Patient Age:    48 years        BP:           88/64 mmHg Patient Gender: F               HR:           95 bpm. Exam Location:  [facility] Procedure: 2D Echo, Cardiac Doppler and Color Doppler Indications:    Bacteremia R78.81  History:        Patient has prior history of Echocardiogram examinations, most                 recent 02/23/2020. Risk Factors:Dyslipidemia. Cardiac Arrest,                 Elevated Liver Function Test.  Sonographer:    Alvino Chapel RCS Referring Phys: [id number] [place]  1. Left ventricular ejection fraction, by estimation, is 60 to 65%. The left ventricle has normal function. The left ventricle has no regional wall motion abnormalities. Left ventricular diastolic parameters are indeterminate.  2. Right ventricular systolic function is normal. The right ventricular size is normal.  3. Left atrial size was moderately dilated.  4. The mitral valve is normal in structure. No evidence of mitral valve regurgitation. No evidence of mitral stenosis.  5. The aortic valve has an indeterminant number of  cusps. Aortic valve regurgitation is not visualized. No aortic stenosis is present.  6. The inferior vena cava is normal in size with greater than 50% respiratory variability, suggesting right atrial pressure of 3 mmHg. FINDINGS  Left Ventricle: Left ventricular ejection fraction, by estimation, is 60 to 65%. The left ventricle has normal function. The left ventricle has no regional wall motion abnormalities. The left ventricular internal cavity size was normal in size. There is  no left ventricular hypertrophy. Left ventricular diastolic parameters are indeterminate. Right Ventricle: The  right ventricular size is normal. No increase in right ventricular wall thickness. Right ventricular systolic function is normal. Left Atrium: Left atrial size was moderately dilated. Right Atrium: Right atrial size was not well visualized. Pericardium: There is no evidence of pericardial effusion. Mitral Valve: The mitral valve is normal in structure. No evidence of mitral valve regurgitation. No evidence of mitral valve stenosis. Tricuspid Valve: The tricuspid valve is normal in structure. Tricuspid valve regurgitation is not demonstrated. No evidence of tricuspid stenosis. Aortic Valve: The aortic valve has an indeterminant number of cusps. Aortic valve regurgitation is not visualized. No aortic stenosis is present. Aortic valve mean gradient measures 5.1 mmHg. Aortic valve peak gradient measures 8.2 mmHg. Aortic valve area, by VTI measures 2.13 cm. Pulmonic Valve: The pulmonic valve was not well visualized. Pulmonic valve regurgitation is not visualized. No evidence of pulmonic stenosis. Aorta: The aortic root is normal in size and structure. Pulmonary Artery: Inadequate TR jet, indeterminant PASP. Venous: The inferior vena cava is normal in size with greater than 50% respiratory variability, suggesting right atrial pressure of 3 mmHg. IAS/Shunts: No atrial level shunt detected by color flow Doppler.  LEFT VENTRICLE PLAX 2D  LVIDd:         4.80 cm  Diastology LVIDs:         3.10 cm  LV e' medial:    10.60 cm/s LV PW:         1.00 cm  LV E/e' medial:  11.3 LV IVS:        1.00 cm  LV e' lateral:   9.14 cm/s LVOT diam:     1.80 cm  LV E/e' lateral: 13.1 LV SV:         64 LV SV Index:   41 LVOT Area:     2.54 cm  RIGHT VENTRICLE RV S prime:     16.30 cm/s TAPSE (M-mode): 2.6 cm LEFT ATRIUM             Index       RIGHT ATRIUM           Index LA diam:        4.20 cm 2.72 cm/m  RA Area:     14.20 cm LA Vol (A2C):   53.8 ml 34.86 ml/m RA Volume:   36.10 ml  23.39 ml/m LA Vol (A4C):   64.2 ml 41.60 ml/m LA Biplane Vol: 62.5 ml 40.49 ml/m  AORTIC VALVE AV Area (Vmax):    2.40 cm AV Area (Vmean):   2.00 cm AV Area (VTI):     2.13 cm AV Vmax:           143.34 cm/s AV Vmean:          109.425 cm/s AV VTI:            0.301 m AV Peak Grad:      8.2 mmHg AV Mean Grad:      5.1 mmHg LVOT Vmax:         135.00 cm/s LVOT Vmean:        86.200 cm/s LVOT VTI:          0.251 m LVOT/AV VTI ratio: 0.84  AORTA Ao Root diam: 3.00 cm MITRAL VALVE MV Area (PHT): 3.97 cm     SHUNTS MV Decel Time: 191 msec     Systemic VTI:  0.25 m MV E velocity: 120.00 cm/s  Systemic Diam: 1.80 cm MV A velocity: 87.30 cm/s MV E/A ratio:  1.37 Roderic Palau  Branch MD Electronically signed by Carlyle Dolly MD Signature Date/Time: 05/15/2020/2:16:14 PM    Final    ECHO TEE  Result Date: 05/16/2020    TRANSESOPHOGEAL ECHO REPORT   Patient Name:   [name] Date of Exam: 05/16/2020 Medical Rec #:  [id number]       Height:       60.0 in Accession #:    [id number]      Weight:       127.9 lb Date of Birth:  [birth year]        BSA:          1.543 m Patient Age:    10 years        BP:           123/85 mmHg Patient Gender: F               HR:           101 bpm. Exam Location:  [facility] Procedure: Transesophageal Echo, Cardiac Doppler and Color Doppler Indications:    Bacteremia  History:        Patient has prior history of Echocardiogram examinations, most                 recent  05/15/2020. Risk Factors:Dyslipidemia. Cardiac Arrest,                 Elevated Liver Function Test.  Sonographer:    Alvino Chapel RCS Referring Phys: [id number] [place]: The transesophogeal probe was passed without difficulty through the esophogus of the patient. Sedation performed by different physician. The patient developed no complications during the procedure. IMPRESSIONS  1. Left ventricular ejection fraction, by estimation, is 60 to 65%. The left ventricle has normal function.  2. Right ventricular systolic function is normal. The right ventricular size is normal.  3. Left atrial size was mildly dilated. No left atrial/left atrial appendage thrombus was detected. The LAA emptying velocity was 85 cm/s.  4. Right atrial size was mildly dilated.  5. The mitral valve is normal in structure. Trivial mitral valve regurgitation. No evidence of mitral stenosis.  6. The aortic valve is tricuspid. Aortic valve regurgitation is not visualized. No aortic stenosis is present. Conclusion(s)/Recommendation(s): No evidence of vegetation/infective endocarditis on this transesophageal echocardiogram. FINDINGS  Left Ventricle: Left ventricular ejection fraction, by estimation, is 60 to 65%. The left ventricle has normal function. The left ventricular internal cavity size was normal in size. Right Ventricle: The right ventricular size is normal. No increase in right ventricular wall thickness. Right ventricular systolic function is normal. Left Atrium: Left atrial size was mildly dilated. No left atrial/left atrial appendage thrombus was detected. The LAA emptying velocity was 85 cm/s. Right Atrium: Right atrial size was mildly dilated. Pericardium: There is no evidence of pericardial effusion. Mitral Valve: The mitral valve is normal in structure. Trivial mitral valve regurgitation. No evidence of mitral valve stenosis. Tricuspid Valve: The tricuspid valve is normal in structure. Tricuspid valve regurgitation  is trivial. No evidence of tricuspid stenosis. Aortic Valve: The aortic valve is tricuspid. Aortic valve regurgitation is not visualized. No aortic stenosis is present. Pulmonic Valve: The pulmonic valve was normal in structure. Pulmonic valve regurgitation is not visualized. No evidence of pulmonic stenosis. Aorta: The aortic root is normal in size and structure. IAS/Shunts: No atrial level shunt detected by color flow Doppler. Carlyle Dolly MD Electronically signed by Carlyle Dolly MD Signature Date/Time: 05/16/2020/1:28:24 PM    Final  [name] ASCITES (ABDOMEN LIMITED)  Result Date: 05/16/2020 CLINICAL DATA:  Cirrhosis, ascites EXAM: LIMITED ABDOMEN ULTRASOUND FOR ASCITES TECHNIQUE: Limited ultrasound survey for ascites was performed in all four abdominal quadrants. COMPARISON:  05/14/2020 FINDINGS: Small volume ascites identified in the lower quadrants bilaterally. More ascites is seen at the midline at the pelvis. Volume of ascites is sufficient for diagnostic paracentesis. IMPRESSION: Sufficient ascites for diagnostic paracentesis. Electronically Signed   By: Lavonia Dana M.D.   On: 05/26/2020 16:16   [name] ASCITES (ABDOMEN LIMITED)  Result Date: 05/14/2020 CLINICAL DATA:  Ascites, question sufficient for paracentesis, clinical concern for spontaneous bacterial peritonitis EXAM: LIMITED ABDOMEN ULTRASOUND FOR ASCITES TECHNIQUE: Limited ultrasound survey for ascites was performed in all four abdominal quadrants. COMPARISON:  CT abdomen and pelvis 05/13/2020 FINDINGS: Small volume ascites identified in the lower quadrants bilaterally. Volume of ascites has probably slightly increased since prior CT. While the volume of fluid is insufficient for therapeutic paracentesis, a small volume of ascites may be obtainable for diagnostic purposes. Diagnostic paracentesis will be attempted. IMPRESSION: Small volume ascites, likely sufficient volume for diagnostic paracentesis. Electronically Signed   By: Lavonia Dana M.D.    On: 05/14/2020 11:11   [name] EKG SITE RITE  Result Date: 05/17/2020 If Site Rite image not attached, placement could not be confirmed due to current cardiac rhythm. [2 weeks]   Assessment: 55 year old female with history of skull base hemangioma, PNES, seizures, COPD, recently inpatient for severe sepsis due to MSSA bacteremia and discharged on 5/15 receiving Ancef through PICC line, cirrhosis most likely due to fatty liver and had a actually establish care with Dr. Tarri Glenn in March 2022, CT abdomen and pelvis with contrast April 2022 with cirrhosis, paraesophageal and gastric varices.  Presented to the ED being found down at the skilled nursing facility.  Admitted with acute encephalopathy, acute renal injury, urinary retention.  GI consulted for acute vomiting of bright red blood morning of 06/04/2020.  Cirrhosis: Likely due to fatty liver but evaluation in process by Dr. Tarri Glenn.  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 [facility] this admission 10. INR 2.3, given vitamin K and INR improved to 1.5.   Acute upper GI bleed: Given clinical scenario, there was concern for variceal bleeding.  Octreotide and Protonix infusion initiated.  SBP prophylaxis added, ceftriaxone and will need to continue for 7 days..  EGD showed no active bleeding or stigmata of bleeding in the entire upper GI tract.  She had esophagitis with no bleeding.  She had moderate portal hypertensive gastropathy.  No esophageal or gastric varices identified.  Biopsies taken to rule out H. pylori.  PPI was switched to p.o. twice daily.  Octreotide drip was discontinued.   Hepatic encephalopathy: continue oral lactulose with goal of 2-3 soft BMs daily. Continue oral xifaxan 550mg  PO BID.  Elevated LFTs: AST/ALT improved from last hospitalization. Tbili slightly increased.    Plan: 1. Continue Protonix IV twice daily.  Will transition to oral twice daily as an outpatient. 2. Complete 7 days of antibiotics for SBP  prophylaxis. 3. Follow-up on paracentesis that is ordered for today. 4. Continue oral lactulose (titrate to 2-3 soft BMs daily) and Xifaxan 550 mg twice daily. 5. Palliative consult appreciated.  Plans for patient to be discharged to home with palliative services.  Goal to treat the treatable but no CPR or intubation. 6. Monitor LFTs.  Laureen Ochs. Bernarda Caffey [facility] [phone] 5/25/202211:52 AM     LOS: 3 days

## 2020-06-06 NOTE — Discharge Instructions (Signed)
Paracentesis, Care After This sheet gives you information about how to care for yourself after your procedure. Your health care provider may also give you more specific instructions. If you have problems or questions, contact your health care provider. What can I expect after the procedure? After the procedure, it is common to have a small amount of clear fluid coming from the puncture site. Follow these instructions at home: Puncture site care  Follow instructions from your health care provider about how to take care of your puncture site. Make sure you: ? Wash your hands with soap and water before and after you change your bandage (dressing). If soap and water are not available, use hand sanitizer. ? Change your dressing as told by your health care provider.  Check your puncture area every day for signs of infection. Check for: ? Redness, swelling, or pain. ? More fluid or blood. ? Warmth. ? Pus or a bad smell.   General instructions  Return to your normal activities as told by your health care provider. Ask your health care provider what activities are safe for you.  Take over-the-counter and prescription medicines only as told by your health care provider.  Do not take baths, swim, or use a hot tub until your health care provider approves. Ask your health care provider if you may take showers. You may only be allowed to take sponge baths.  Keep all follow-up visits as told by your health care provider. This is important. Contact a health care provider if:  You have redness, swelling, or pain at your puncture site.  You have more fluid or blood coming from your puncture site.  Your puncture site feels warm to the touch.  You have pus or a bad smell coming from your puncture site.  You have a fever. Get help right away if:  You have chest pain or shortness of breath.  You develop increasing pain, discomfort, or swelling in your abdomen.  You feel dizzy or light-headed or  you faint. Summary  After the procedure, it is common to have a small amount of clear fluid coming from the puncture site.  Follow instructions from your health care provider about how to take care of your puncture site.  Check your puncture area every day signs of infection.  Keep all follow-up visits as told by your health care provider. This information is not intended to replace advice given to you by your health care provider. Make sure you discuss any questions you have with your health care provider. Document Revised: 07/13/2018 Document Reviewed: 10/20/2017 Elsevier Patient Education  2021 Elsevier Inc.  

## 2020-06-06 NOTE — Sedation Documentation (Signed)
PT tolerated right sided paracentesis procedure well today and 3 liters of clear yellow fluid removed with labs collected and sent for processing. PT vital signs remained stable at completion of procedure. Mother via telephone verbalized understanding of procedure instructions and gave consent to [facility] and I over the phone for procedure. PT returned to inpatient bed assignment at this time.

## 2020-06-06 NOTE — Procedures (Signed)
    US guided RLQ paracentesis 3 L yellow fluid Sent for labs per MD  Tolerated well  EBL: less than 1 cc

## 2020-06-07 DIAGNOSIS — Z515 Encounter for palliative care: Secondary | ICD-10-CM | POA: Diagnosis not present

## 2020-06-07 DIAGNOSIS — Z7189 Other specified counseling: Secondary | ICD-10-CM | POA: Diagnosis not present

## 2020-06-07 DIAGNOSIS — K7200 Acute and subacute hepatic failure without coma: Secondary | ICD-10-CM | POA: Diagnosis not present

## 2020-06-07 LAB — CYTOLOGY - NON PAP

## 2020-06-07 MED ORDER — FLEET ENEMA 7-19 GM/118ML RE ENEM: 1.0000 | ENEMA | Freq: Every day | RECTAL | Status: DC | PRN

## 2020-06-07 NOTE — Progress Notes (Signed)
PROGRESS NOTE    LARETHA LUEPKE  ZOX:[id number] DOB: 1965-09-29 DOA: 06/07/2020 PCP: Caprice Renshaw, MD   Brief Narrative:   Genessis Flanary Sheltonis a 55 y.o.femalewith medical history significant forCOPD, meningioma, cardiac arrest December 2020, hepatic steatosis,PNES, cognitive impairment, depression,andcomplex partial seizurewho presented to the ED after being found down at her skilled facility.  She was admitted with acute hepatic encephalopathy.  5/23 she did develop some hematemesis and has undergone EGD on 5/24 with findings of some mild esophagitis as well as gastritis.  She has been restarted on a diet with oral PPI.  Paracentesis ordered for today.  Family would like to take patient home with home health and outpatient palliative care once stable for discharge.  Assessment & Plan:   Active Problems:   Cirrhosis ([place])   Acute hepatic encephalopathy   UGI bleed   Acute hepatic encephalopathy -Continue on lactulose and monitor ammonia levels which are fluctuating (elevated today). -Uncertain if she has been taking her medications or not versus need for adjustment. -Continue supportive care. -Per records and based on patient inability to take medications orally at times she is receiving less rifaximin and lactulose, of what has been ordered per GI recommendations. -?? Needing lactulose enemas.  Hematemesis-resolved -Status post EGD 5/24 with findings of esophagitis and gastritis, but no active bleeding -Octreotide and protonix infusions to discontinue -Oral PPI BID -Monitor H/H and transfuse for H<7 -Soft diet restarted  AKI -improving -Likely prerenal -continue avoiding nephrotoxic agents and monitor intake and output -Recheck a.m. labs -encouraged adequate hydration   Lactic acidosis-stable -Likely related to dehydration and poor oral intake -Continue to monitor -maintain adequate hydration   MSSA bacteremia -was on Ancef through 5/29 to finish treatment; per  GI rec's started on Rocephin which will cover SBP and MSSA. -no fever.  Urinary retention -Foley catheter in place from prior admission -No signs of UTI currently noted -Plan to attempt voiding trial once more awake and alert  Chronic pain -Continue holding analgesics current regimen until more alert and awake  Liver cirrhosis -Some ascites for which paracentesis ordered -Elevated ammonia levels as noted below, on lactulose -Started on Rocephin per GI for SBP prophylaxis -So far unremarkable cell count from paracentesis done today. -overall with poor prognosis -GI recommending palliative/hospice at discharge.  Anemia of chronic disease-stable -Transfuse for hemoglobin less than 7 -No overt bleeding noted currently -Continue to follow hemoglobin trend.  Recurrent gait instability with frequent falls/dizziness -CT head with no acute findings -Patient to go home with home health  Complex partial seizures/PNES -Continue home Keppra and Lamictal -No seizure activity appreciated at this time.  Depression -Resume fluoxetine and lorazepam  Meningioma -Follows with Dr. [name] outpatient  Transaminitis -Appears to be a persistent problem in the setting of liver cirrhosis/NAFLD with hepatic congestion -Continue to monitor  Restless leg -Continue holding Mirapex due to prior confusion  DVT prophylaxis: Heparin to SCDs Code Status: DNR Family Communication: Discussed with family at bedside 5/24 Disposition Plan:  Status is: Inpatient  Remains inpatient appropriate because:Ongoing diagnostic testing needed not appropriate for outpatient work up, IV treatments appropriate due to intensity of illness or inability to take PO and Inpatient level of care appropriate due to severity of illness   Dispo: The patient is from: SNF  Anticipated d/c is to: Home with home health and outpt palliative  Patient currently is not medically stable to d/c  yet.              Difficult to place  patient No  Skin Assessment:  I have examined the patient's skin and I agree with the wound assessment as performed by the wound care RN as outlined below:  Pressure Injury 05/25/20 Sacrum Mid Stage 1 -  Intact skin with non-blanchable redness of a localized area usually over a bony prominence. (Active)  05/25/20 1746  Location: Sacrum  Location Orientation: Mid  Staging: Stage 1 -  Intact skin with non-blanchable redness of a localized area usually over a bony prominence.  Wound Description (Comments):   Present on Admission: No    Consultants:   GI  Palliative  Procedures:   See below  Antimicrobials:  Anti-infectives (From admission, onward)   Start     Dose/Rate Route Frequency Ordered Stop   06/07/20 1000  cefTRIAXone (ROCEPHIN) 2 g in sodium chloride 0.9 % 100 mL IVPB        2 g 200 mL/hr over 30 Minutes Intravenous Daily 06/06/20 1315 06/11/20 0959   06/06/20 1415  cefTRIAXone (ROCEPHIN) 1 g in sodium chloride 0.9 % 100 mL IVPB        1 g 200 mL/hr over 30 Minutes Intravenous  Once 06/06/20 1315 06/06/20 1402   06/01/2020 2200  rifaximin (XIFAXAN) tablet 550 mg        550 mg Oral 2 times daily 05/22/2020 1524     06/04/20 1330  cefTRIAXone (ROCEPHIN) 1 g in sodium chloride 0.9 % 100 mL IVPB  Status:  Discontinued        1 g 200 mL/hr over 30 Minutes Intravenous Daily 06/04/20 1320 06/06/20 1315   06/06/2020 1400  ceFAZolin (ANCEF) IVPB 2g/100 mL premix  Status:  Discontinued       Note to Pharmacy: Indication:  MSSA bacteremia  First Dose: Yes Last Day of Therapy:  Jun 10, 2020 Labs - Once weekly:  CBC/D and BMP, Labs - Every other week:  ESR and CRP Method of administration: IV Push Method of administration may be changed at the discretion of home infusion pharmacist based upon assessme   2 g 200 mL/hr over 30 Minutes Intravenous Every 8 hours 05/31/2020 1044 06/06/20 1315      Subjective: Lethargic/sleepy after  receiving Ativan overnight; per nursing staff continue to be intermittently confused.  No chest pain, no nausea, no vomiting.  Patient has remained afebrile.  Objective: Vitals:   06/07/20 1300 06/07/20 1400 06/07/20 1500 06/07/20 1528  BP: 137/88 (!) 139/104 (!) 142/96 (!) 158/100  Pulse: (!) 101 96 (!) 106 (!) 107  Resp: 20 20 (!) 24 18  Temp:    98.3 F (36.8 C)  TempSrc:    Oral  SpO2: 98% 93% 99% 98%  Weight:      Height:        Intake/Output Summary (Last 24 hours) at 06/07/2020 1720 Last data filed at 06/07/2020 1649 Gross per 24 hour  Intake 2386.56 ml  Output 450 ml  Net 1936.56 ml   Filed Weights   05/29/2020 0544  Weight: 63 kg    Examination: General exam: Intermittently awake; in no acute distress.  Overnight received Ativan for agitation, making her more sleepy than baseline.  No nausea, no vomiting, no fever. Respiratory system: Clear to auscultation. Respiratory effort normal.  No using accessory muscle. Cardiovascular system:RRR. No murmurs, rubs, gallops.  No JVD. Gastrointestinal system: Abdomen is distended, with positive fluid wave on examination; positive bowel sounds, no tenderness on palpation.  Soft.   Central nervous system: Alert and oriented. No focal neurological deficits.  Extremities: No cyanosis or clubbing.  No lower extremity edema. Skin: No petechiae. Psychiatry: My examination patient demonstrated no suicidal ideation or hallucination.  Flat affect.  Unable to properly assess judgment and insight with current mental status.   Data Reviewed: I have personally reviewed following labs and imaging studies  CBC: Recent Labs  Lab 05/21/2020 0605 06/04/20 0533 06/04/20 1532 06/04/20 2049 05/16/2020 0143 06/06/2020 0507 06/06/20 0455  WBC 8.7 9.1  --   --   --  9.0 12.2*  NEUTROABS 5.8  --   --   --   --   --   --   HGB 8.9* 9.0* 9.6* 8.9* 8.2* 8.0* 8.3*  HCT 27.4* 27.6* 30.2* 28.2* 25.5* 24.9* 26.1*  MCV 96.1 96.5  --   --   --  98.8 98.9  PLT  214 213  --   --   --  178 696   Basic Metabolic Panel: Recent Labs  Lab 06/02/2020 0605 06/04/20 0533 06/10/2020 0507 06/06/20 0455  NA 145 147* 145 148*  K 3.5 3.4* 3.5 3.5  CL 113* 114* 116* 117*  CO2 '23 25 22 23  ' GLUCOSE 106* 102* 115* 109*  BUN 39* 29* 23* 19  CREATININE 1.39* 1.01* 0.85 0.79  CALCIUM 7.7* 7.8* 7.3* 7.8*  MG  --  2.2 2.0 2.1   GFR: Estimated Creatinine Clearance: 66.6 mL/min (by C-G formula based on SCr of 0.79 mg/dL).   Liver Function Tests: Recent Labs  Lab 06/04/2020 0605 06/04/20 0533 06/08/2020 0507 06/06/20 0455  AST 196* 180* 175* 194*  ALT 46* 39 31 30  ALKPHOS 160* 155* 146* 148*  BILITOT 2.5* 2.6* 2.7* 3.3*  PROT 4.9* 5.2* 4.8* 5.0*  ALBUMIN 2.1* 2.3* 2.0* 2.3*   Recent Labs  Lab 05/25/2020 0605  LIPASE 111*   Recent Labs  Lab 05/20/2020 0702 06/04/20 0533 06/09/2020 0555 06/06/20 0455  AMMONIA 72* 55* 77* 97*   Coagulation Profile: Recent Labs  Lab 06/04/20 1403 05/29/2020 0748  INR 2.3* 1.5*   Sepsis Labs: Recent Labs  Lab 06/06/2020 0702 05/20/2020 0815 06/04/20 0533  LATICACIDVEN 1.6 2.0* 1.9    Recent Results (from the past 240 hour(s))  Culture, blood (routine x 2)     Status: None (Preliminary result)   Collection Time: 06/02/2020  8:12 AM   Specimen: Cath Site; Blood  Result Value Ref Range Status   Specimen Description CATH SITE BOTTLES DRAWN AEROBIC AND ANAEROBIC  Final   Special Requests Blood Culture adequate volume  Final   Culture   Final    NO GROWTH 4 DAYS Performed at [facility], [address] [place],  [address]    Report Status PENDING  Incomplete  Culture, blood (routine x 2)     Status: None (Preliminary result)   Collection Time: 05/17/2020  8:15 AM   Specimen: Left Antecubital; Blood  Result Value Ref Range Status   Specimen Description   Final    LEFT ANTECUBITAL BOTTLES DRAWN AEROBIC AND ANAEROBIC   Special Requests Blood Culture adequate volume  Final   Culture   Final    NO GROWTH 4  DAYS Performed at [facility], [address] [place], [place] [address]    Report Status PENDING  Incomplete  SARS CORONAVIRUS 2 (TAT 6-24 HRS) Nasopharyngeal Nasopharyngeal Swab     Status: None   Collection Time: 05/30/2020  8:48 AM   Specimen: Nasopharyngeal Swab  Result Value Ref Range Status   SARS Coronavirus 2 NEGATIVE NEGATIVE  Final    Comment: (NOTE) SARS-CoV-2 target nucleic acids are NOT DETECTED.  The SARS-CoV-2 RNA is generally detectable in upper and lower respiratory specimens during the acute phase of infection. Negative results do not preclude SARS-CoV-2 infection, do not rule out co-infections with other pathogens, and should not be used as the sole basis for treatment or other patient management decisions. Negative results must be combined with clinical observations, patient history, and epidemiological information. The expected result is Negative.  Fact Sheet for Patients: SugarRoll.be  Fact Sheet for Healthcare Providers: https://www.woods-mathews.com/  This test is not yet approved or cleared by the [place] FDA and  has been authorized for detection and/or diagnosis of SARS-CoV-2 by FDA under an Emergency Use Authorization (EUA). This EUA will remain  in effect (meaning this test can be used) for the duration of the COVID-19 declaration under Se ction 564(b)(1) of the Act, 21 U.S.C. section 360bbb-3(b)(1), unless the authorization is terminated or revoked sooner.  Performed at [facility], [place] [address] [place], [place] [address]   MRSA PCR Screening     Status: None   Collection Time: 06/04/20  2:09 PM   Specimen: Nasal Mucosa; Nasopharyngeal  Result Value Ref Range Status   MRSA by PCR NEGATIVE NEGATIVE Final    Comment:        The GeneXpert MRSA Assay (FDA approved for NASAL specimens only), is one component of a comprehensive MRSA colonization surveillance program. It is not intended to  diagnose MRSA infection nor to guide or monitor treatment for MRSA infections. Performed at [facility], [address], [place], [place] [address]   Culture, body fluid w Gram Stain-bottle     Status: None (Preliminary result)   Collection Time: 06/06/20 11:15 AM   Specimen: Peritoneal Washings  Result Value Ref Range Status   Specimen Description PERITONEAL  Final   Special Requests BOTTLES DRAWN AEROBIC AND ANAEROBIC 10CC  Final   Culture   Final    NO GROWTH < 24 HOURS Performed at [facility], [address] [place], [place] [address]    Report Status PENDING  Incomplete  Gram stain     Status: None   Collection Time: 06/06/20 11:15 AM   Specimen: Peritoneal Washings  Result Value Ref Range Status   Specimen Description PERITONEAL  Final   Special Requests NONE  Final   Gram Stain   Final    CYTOSPIN SMEAR NO ORGANISMS SEEN WBC PRESENT, PREDOMINANTLY MONONUCLEAR Performed at [facility], [address], [place], [place] [address]    Report Status 06/06/2020 FINAL  Final     Radiology Studies: US Paracentesis  Result Date: 06/06/2020 INDICATION: Ascites EXAM: ULTRASOUND GUIDED DIAGNOSTIC AND THERAPEUTIC PARACENTESIS MEDICATIONS: 10 cc 1% lidocaine COMPLICATIONS: None immediate. PROCEDURE: Informed written consent was obtained from the patient after a discussion of the risks, benefits and alternatives to treatment. A timeout was performed prior to the initiation of the procedure. Initial ultrasound scanning demonstrates a large amount of ascites within the right lower abdominal quadrant. The right lower abdomen was prepped and draped in the usual sterile fashion. 1% lidocaine was used for local anesthesia. Following this, a Yueh catheter was introduced. An ultrasound image was saved for documentation purposes. The paracentesis was performed. The catheter was removed and a dressing was applied. The patient tolerated the procedure well without immediate post procedural  complication. Patient received post-procedure intravenous albumin; see nursing notes for details. FINDINGS: A total of approximately 3 liters of yellow fluid  was removed. Samples were sent to the laboratory as requested by the clinical team. IMPRESSION: Successful ultrasound-guided paracentesis yielding 3 liters of peritoneal fluid. Read by Lavonia Drafts [facility] Electronically Signed   By: Lavonia Dana M.D.   On: 06/06/2020 12:01    Scheduled Meds: . Chlorhexidine Gluconate Cloth  6 each Topical Daily  . FLUoxetine  80 mg Oral Daily  . folic acid  1 mg Oral Daily  . lactulose  20 g Oral TID  . lamoTRIgine  150 mg Oral BID  . levETIRAcetam  1,000 mg Oral BID  . pantoprazole  40 mg Oral BID  . rifaximin  550 mg Oral BID   Continuous Infusions: . cefTRIAXone (ROCEPHIN)  IV 2 g (06/07/20 1220)  . lactated ringers 100 mL/hr at 06/07/20 1649     LOS: 4 days    Time spent: 35 minutes    Barton Dubois, MD [facility]  If 7PM-7AM, please contact night-coverage [email or web address] 06/07/2020, 5:20 PM

## 2020-06-07 NOTE — Progress Notes (Signed)
Palliative:   Ms. Treina, [place], is lying quietly in bed.  As usual, she keeps her eyes closed while I am there.  She seems very sleepy, and does not really interact with me this morning.  Bedside nursing staff shares that she had Ativan for anxiety.  Call to niece, Estill Bamberg.  We talked about plan to keep Jeannemarie here until she has finished with her IV antibiotics on 5/29.  Estill Bamberg states that the family would be grateful.  We talked about paracentesis results showing no infection.  Estill Bamberg asks about H. pylori testing, I share that I do not see results at this time, but if positive, she will be treated.  Amanda's asks about bowel movements.  We talked about current ammonia levels, lactulose regimen, GI consult.  Conference with attending, GI, bedside nursing staff, transition of care team related to patient condition, needs, goals of care, disposition.  Plan: Continue to treat the treatable but no CPR or intubation.  At this point, rehospitalize as needed.  Outpatient palliative services to follow.  Anticipate need for hospice type care within the next 3 to 6 months.  81 minutes  Quinn Axe, NP Palliative medicine team Team phone [phone] Greater than 50% of this time

## 2020-06-08 DIAGNOSIS — R188 Other ascites: Secondary | ICD-10-CM | POA: Diagnosis not present

## 2020-06-08 DIAGNOSIS — K7200 Acute and subacute hepatic failure without coma: Secondary | ICD-10-CM | POA: Diagnosis not present

## 2020-06-08 DIAGNOSIS — Z7189 Other specified counseling: Secondary | ICD-10-CM | POA: Diagnosis not present

## 2020-06-08 DIAGNOSIS — Z515 Encounter for palliative care: Secondary | ICD-10-CM | POA: Diagnosis not present

## 2020-06-08 DIAGNOSIS — K7460 Unspecified cirrhosis of liver: Secondary | ICD-10-CM | POA: Diagnosis not present

## 2020-06-08 LAB — COMPREHENSIVE METABOLIC PANEL
ALT: 33 U/L (ref 0–44)
AST: 147 U/L — ABNORMAL HIGH (ref 15–41)
Albumin: 2.1 g/dL — ABNORMAL LOW (ref 3.5–5.0)
Alkaline Phosphatase: 146 U/L — ABNORMAL HIGH (ref 38–126)
Anion gap: 6 (ref 5–15)
BUN: 16 mg/dL (ref 6–20)
CO2: 22 mmol/L (ref 22–32)
Calcium: 7.6 mg/dL — ABNORMAL LOW (ref 8.9–10.3)
Chloride: 119 mmol/L — ABNORMAL HIGH (ref 98–111)
Creatinine, Ser: 0.75 mg/dL (ref 0.44–1.00)
GFR, Estimated: >60 mL/min (ref >60)
Glucose, Bld: 109 mg/dL — ABNORMAL HIGH (ref 70–99)
Potassium: 3.4 mmol/L — ABNORMAL LOW (ref 3.5–5.1)
Sodium: 147 mmol/L — ABNORMAL HIGH (ref 135–145)
Total Bilirubin: 3.6 mg/dL — ABNORMAL HIGH (ref 0.3–1.2)
Total Protein: 4.8 g/dL — ABNORMAL LOW (ref 6.5–8.1)

## 2020-06-08 LAB — CULTURE, BLOOD (ROUTINE X 2)
Culture: NO GROWTH
Culture: NO GROWTH
Special Requests: ADEQUATE
Special Requests: ADEQUATE

## 2020-06-08 LAB — CBC
HCT: 25.5 % — ABNORMAL LOW (ref 36.0–46.0)
Hemoglobin: 8.1 g/dL — ABNORMAL LOW (ref 12.0–15.0)
MCH: 31.6 pg (ref 26.0–34.0)
MCHC: 31.8 g/dL (ref 30.0–36.0)
MCV: 99.6 fL (ref 80.0–100.0)
Platelets: 162 10*3/uL (ref 150–400)
RBC: 2.56 MIL/uL — ABNORMAL LOW (ref 3.87–5.11)
RDW: 25.8 % — ABNORMAL HIGH (ref 11.5–15.5)
WBC: 14.6 10*3/uL — ABNORMAL HIGH (ref 4.0–10.5)
nRBC: 0.3 % — ABNORMAL HIGH (ref 0.0–0.2)

## 2020-06-08 LAB — PROTIME-INR
INR: 1.5 — ABNORMAL HIGH (ref 0.8–1.2)
Prothrombin Time: 17.7 seconds — ABNORMAL HIGH (ref 11.4–15.2)

## 2020-06-08 LAB — AMMONIA: Ammonia: 110 umol/L — ABNORMAL HIGH (ref 9–35)

## 2020-06-08 MED ORDER — DIPHENHYDRAMINE HCL 50 MG/ML IJ SOLN
12.5000 mg | Freq: Once | INTRAMUSCULAR | Status: AC
  Administered 2020-06-08: 12.5 mg via INTRAVENOUS
  Filled 2020-06-08: qty 1

## 2020-06-08 MED ORDER — LACTULOSE ENEMA
300.0000 mL | Freq: Two times a day (BID) | ORAL | Status: AC
  Filled 2020-06-08 (×2): qty 300

## 2020-06-08 MED ORDER — POTASSIUM CL IN DEXTROSE 5% 20 MEQ/L IV SOLN
20.0000 meq | INTRAVENOUS | Status: DC
  Administered 2020-06-08 – 2020-06-09 (×2): 20 meq via INTRAVENOUS

## 2020-06-08 MED ORDER — FUROSEMIDE 10 MG/ML IJ SOLN
40.0000 mg | Freq: Every day | INTRAMUSCULAR | Status: DC
  Administered 2020-06-08 – 2020-06-09 (×2): 40 mg via INTRAVENOUS
  Filled 2020-06-08 (×2): qty 4

## 2020-06-08 MED ORDER — ACETAMINOPHEN 650 MG RE SUPP
650.0000 mg | Freq: Three times a day (TID) | RECTAL | Status: DC | PRN
  Administered 2020-06-08 (×2): 650 mg via RECTAL
  Filled 2020-06-08 (×2): qty 1

## 2020-06-08 NOTE — Progress Notes (Signed)
Post suppository vss temp reduced to 99.9 mews back to green. Will continue to monitor.

## 2020-06-08 NOTE — Progress Notes (Signed)
  MD and charge notified due to patients mews score of yellow. No new orders at this time will continue to monitor.   06/08/20 0154  Vitals  Temp (!) 100.9 F (38.3 C)  Temp Source Axillary  BP (!) 134/98  MAP (mmHg) 109  BP Location Left Arm  BP Method Automatic  Resp 20  MEWS COLOR  MEWS Score Color Yellow  Oxygen Therapy  SpO2 97 %  O2 Device Room Air  MEWS Score  MEWS Temp 1  MEWS Systolic 0  MEWS Pulse 1  MEWS RR 0  MEWS LOC 0  MEWS Score 2

## 2020-06-08 NOTE — Care Management Important Message (Signed)
Important Message  Patient Details  Name: [name] MRN: [id number] Date of Birth: 1965/04/06   Medicare Important Message Given:  Yes     Tommy Medal 06/08/2020, 11:50 AM

## 2020-06-08 NOTE — TOC Progression Note (Signed)
Transition of Care [facility]) - Progression Note    Patient Details  Name: SHATARA STANEK MRN: [id number] Date of Birth: 04-02-1965  Transition of [facility]) CM/SW Contact  Ihor Gully, LCSW Phone Number: 06/08/2020, 10:55 AM  Clinical Narrative:    Damaris Schooner with Freda Munro with [employer] regarding full length bed rails and to refer to attending's note on 5/25 regarding rationale for full length hospital bed rails. Niece, Estill Bamberg, advised.    Expected Discharge Plan: [place] Barriers to Discharge: Continued Medical Work up  Expected Discharge Plan and Services Expected Discharge Plan: [place] In-house Referral: Clinical Social Work   Post Acute Care Choice: [place] arrangements for the past 2 months: [place]                 DME Arranged: Hospital bed DME Agency: [employer] Date DME Agency Contacted: 06/04/20 Time DME Agency Contacted: 1325 Representative spoke with at DME Agency: Sheila [place]: RN,PT,Social Work [place]: [place] Date [place]: 06/04/20 Time [place]: 1326 Representative spoke with at [place]: [place] ([place]) Interventions    Readmission Risk Interventions Readmission Risk Prevention Plan 06/04/2020 05/14/2020 02/24/2020  Medication Screening - - Complete  Transportation Screening Complete Complete Complete  Home Care Screening - Complete -  Medication Review (RN CM) - Complete -  Medication Review (RN Care Manager) Complete - -  [place] or Home Care Consult Complete - -  SW Recovery Care/Counseling Consult Complete - -  Palliative Care Screening Complete - -  [place] Patient Refused - -  Some recent data might be hidden

## 2020-06-08 NOTE — Progress Notes (Signed)
MD notified that patient was unable to take any of her night time medications. Labs ordered for a.m. will continue to monitor.

## 2020-06-08 NOTE — Progress Notes (Signed)
Subjective:  Patient agitated this morning, per nursing she typically has increased agitation first thing. She has spiked temp overnight. Per nursing 101 was latest reading not yet documented. She refused a lot of her night time medications or nursing had difficulty getting her to take. She had 3 stools in past 12 hours. First one recorded has bristol 7 black/red. The second two bristol 7 brown/red. She is having dark urine with blood clots.   Objective: Vital signs in last 24 hours: Temp:  [98.1 F (36.7 C)-100.9 F (38.3 C)] 99.9 F (37.7 C) (05/27 0416) Pulse Rate:  [86-109] 108 (05/27 0416) Resp:  [13-28] 20 (05/27 0416) BP: (118-158)/(64-104) 123/82 (05/27 0416) SpO2:  [93 %-100 %] 96 % (05/27 0416) Last BM Date: 06/07/20 General:   Opens eyes briefly. Does not follow commands or communicate. She is agitated. Turns herself over in the bed. . Abdomen:  Soft but distended. Appears nontender with no grimacing during exam but difficult to assess. Bowel sounds slightly tympanic.   Extremities:  Without clubbing, deformity or edema. Neurologic:  Agitated. Could not check for asterixis. Psych:  Could not assess.  Intake/Output from previous day: 05/26 0701 - 05/27 0700 In: 1666.6 [P.O.:720; I.V.:946.6] Out: 500 [Urine:500] Intake/Output this shift: No intake/output data recorded.  Lab Results: CBC Recent Labs    06/06/20 0455  WBC 12.2*  HGB 8.3*  HCT 26.1*  MCV 98.9  PLT 207   BMET Recent Labs    06/06/20 0455  NA 148*  K 3.5  CL 117*  CO2 23  GLUCOSE 109*  BUN 19  CREATININE 0.79  CALCIUM 7.8*   LFTs Recent Labs    06/06/20 0455  BILITOT 3.3*  ALKPHOS 148*  AST 194*  ALT 30  PROT 5.0*  ALBUMIN 2.3*   No results for input(s): LIPASE in the last 72 hours. PT/INR No results for input(s): LABPROT, INR in the last 72 hours.    Imaging Studies: CT Head Wo Contrast  Result Date: 06/01/2020 CLINICAL DATA:  Head trauma. Found on floor by staff. Baseline  altered mental status. EXAM: CT HEAD WITHOUT CONTRAST TECHNIQUE: Contiguous axial images were obtained from the base of the skull through the vertex without intravenous contrast. COMPARISON:  CT head 05/30/2018 FINDINGS: Brain: No evidence of large-territorial acute infarction. No parenchymal hemorrhage. No mass lesion. No extra-axial collection. No mass effect or midline shift. No hydrocephalus. Basilar cisterns are patent. Vascular: No hyperdense vessel. Skull: No acute fracture or focal lesion. Sinuses/Orbits: Paranasal sinuses and mastoid air cells are clear. The orbits are unremarkable. Other: None. IMPRESSION: No acute intracranial abnormality. Electronically Signed   By: Iven Finn M.D.   On: 05/30/2020 06:40   CT Head Wo Contrast  Result Date: 05/29/2020 CLINICAL DATA:  Head injury, Unwitnessed fall, altered mental status EXAM: CT HEAD WITHOUT CONTRAST CT CERVICAL SPINE WITHOUT CONTRAST TECHNIQUE: Multidetector CT imaging of the head and cervical spine was performed following the standard protocol without intravenous contrast. Multiplanar CT image reconstructions of the cervical spine were also generated. COMPARISON:  None. FINDINGS: CT HEAD FINDINGS Brain: Normal anatomic configuration. No abnormal intra or extra-axial mass lesion or fluid collection. No abnormal mass effect or midline shift. No evidence of acute intracranial hemorrhage or infarct. Ventricular size is normal. Cerebellum unremarkable. Vascular: Unremarkable Skull: Intact Sinuses/Orbits: Paranasal sinuses are clear. Orbits are unremarkable. Other: There is hypoplasia of the mastoid air cells bilaterally. Middle ear cavities are clear. CT CERVICAL SPINE FINDINGS Alignment: Accentuated cervical lordosis is likely positional  in nature. No listhesis. Skull base and vertebrae: The craniocervical junction is unremarkable. The atlantodental interval is normal. No acute fracture of the cervical spine. Soft tissues and spinal canal: No  prevertebral fluid or swelling. No visible canal hematoma. Disc levels: There is intervertebral disc space narrowing and endplate remodeling of K8-M0, most severe at C6-7 in keeping with changes of mild to moderate degenerative disc disease. There are posterior disc herniations noted at C2-3 and C3-4 which mildly effaces the anterior canal space. Eccentric posterior disc osteophyte complex, asymmetrically more severe on the left, results in abutment of the thecal sac without remodeling. Multilevel uncovertebral and facet arthrosis results in multilevel neuroforaminal narrowing, most severe bilaterally at C6-7. Upper chest: Mild biapical scarring is present. Other: Right upper extremity PICC line is partially visualized. IMPRESSION: No acute intracranial abnormality.  No calvarial fracture. No acute fracture or listhesis of the cervical spine. Multilevel degenerative disc and degenerative joint disease, most severe at C6-7 Electronically Signed   By: Fidela Salisbury MD   On: 05/29/2020 05:02   CT HEAD WO CONTRAST  Result Date: 05/24/2020 CLINICAL DATA:  55 year old female with altered mental status. EXAM: CT HEAD WITHOUT CONTRAST TECHNIQUE: Contiguous axial images were obtained from the base of the skull through the vertex without intravenous contrast. COMPARISON:  Head CT dated 05/12/2020. FINDINGS: Evaluation of this exam is limited due to motion artifact. Brain: The ventricles and sulci appropriate size for patient's age. The gray-white matter discrimination is preserved. There is no acute intracranial hemorrhage. No mass effect or midline shift. No extra-axial fluid collection. Vascular: No hyperdense vessel or unexpected calcification. Skull: Normal. Negative for fracture or focal lesion. Sinuses/Orbits: No acute finding. Other: Mild left forehead contusion. IMPRESSION: No acute intracranial pathology. Electronically Signed   By: Anner Crete M.D.   On: 05/24/2020 02:17   CT HEAD WO CONTRAST  Result  Date: 05/13/2020 CLINICAL DATA:  Fall 2 days prior EXAM: CT HEAD WITHOUT CONTRAST TECHNIQUE: Contiguous axial images were obtained from the base of the skull through the vertex without intravenous contrast. COMPARISON:  CT 02/20/2020 FINDINGS: Brain: No evidence of acute infarction, hemorrhage, hydrocephalus, extra-axial collection, visible mass lesion or mass effect. Vascular: Atherosclerotic calcification of the carotid siphons. No hyperdense vessel. Skull: No calvarial fracture or suspicious osseous lesion. No scalp swelling or hematoma. Sinuses/Orbits: Paranasal sinuses are predominantly clear. Mastoid air cells appear chronically hypo pneumatized. Middle ear cavities are clear. Debris in the bilateral external auditory canals. Included orbital structures are unremarkable. Other: Asymmetric thickening and calcification in the left parotid gland is a chronic, nonspecific finding, similar to comparison study. IMPRESSION: No acute intracranial abnormality. No calvarial fracture, significant scalp swelling or hematoma. Intracranial atherosclerosis. Asymmetric thickening and calcification in the left parotid gland, a nonspecific, chronic finding unchanged from prior. Can reflect sequela of a chronic inflammatory process. Debris in the external auditory canals, correlate for cerumen impaction. Electronically Signed   By: Lovena Le M.D.   On: 05/13/2020 00:44   CT Cervical Spine Wo Contrast  Result Date: 05/29/2020 CLINICAL DATA:  Head injury, Unwitnessed fall, altered mental status EXAM: CT HEAD WITHOUT CONTRAST CT CERVICAL SPINE WITHOUT CONTRAST TECHNIQUE: Multidetector CT imaging of the head and cervical spine was performed following the standard protocol without intravenous contrast. Multiplanar CT image reconstructions of the cervical spine were also generated. COMPARISON:  None. FINDINGS: CT HEAD FINDINGS Brain: Normal anatomic configuration. No abnormal intra or extra-axial mass lesion or fluid collection.  No abnormal mass effect or midline shift.  No evidence of acute intracranial hemorrhage or infarct. Ventricular size is normal. Cerebellum unremarkable. Vascular: Unremarkable Skull: Intact Sinuses/Orbits: Paranasal sinuses are clear. Orbits are unremarkable. Other: There is hypoplasia of the mastoid air cells bilaterally. Middle ear cavities are clear. CT CERVICAL SPINE FINDINGS Alignment: Accentuated cervical lordosis is likely positional in nature. No listhesis. Skull base and vertebrae: The craniocervical junction is unremarkable. The atlantodental interval is normal. No acute fracture of the cervical spine. Soft tissues and spinal canal: No prevertebral fluid or swelling. No visible canal hematoma. Disc levels: There is intervertebral disc space narrowing and endplate remodeling of F8-B0, most severe at C6-7 in keeping with changes of mild to moderate degenerative disc disease. There are posterior disc herniations noted at C2-3 and C3-4 which mildly effaces the anterior canal space. Eccentric posterior disc osteophyte complex, asymmetrically more severe on the left, results in abutment of the thecal sac without remodeling. Multilevel uncovertebral and facet arthrosis results in multilevel neuroforaminal narrowing, most severe bilaterally at C6-7. Upper chest: Mild biapical scarring is present. Other: Right upper extremity PICC line is partially visualized. IMPRESSION: No acute intracranial abnormality.  No calvarial fracture. No acute fracture or listhesis of the cervical spine. Multilevel degenerative disc and degenerative joint disease, most severe at C6-7 Electronically Signed   By: Fidela Salisbury MD   On: 05/29/2020 05:02   CT ABDOMEN PELVIS W CONTRAST  Result Date: 05/13/2020 CLINICAL DATA:  Abdominal pain, fever, right upper quadrant pain EXAM: CT ABDOMEN AND PELVIS WITH CONTRAST TECHNIQUE: Multidetector CT imaging of the abdomen and pelvis was performed using the standard protocol following bolus  administration of intravenous contrast. CONTRAST:  138mL OMNIPAQUE IOHEXOL 300 MG/ML  SOLN COMPARISON:  CT 02/12/2020 FINDINGS: Lower chest: Basilar atelectatic changes in the lungs including more bandlike areas of subsegmental atelectasis or scarring. Normal heart size. No pericardial effusion. Hepatobiliary: Heterogeneous hepatic attenuation with a nodular liver surface contour concerning for intrinsic liver disease/cirrhosis. No concerning focal liver lesion. Moderate gallbladder distension. No significant gallbladder wall thickening or pericholecystic inflammation is seen. No visible calcified gallstones or biliary ductal dilatation. Pancreas: Slightly edematous appearance of the pancreatic parenchyma with very faint peripancreatic haze, uniform enhancement. No pancreatic ductal dilatation. Spleen: Mild splenomegaly.  No concerning focal splenic lesion. Adrenals/Urinary Tract: Normal adrenals. Kidneys enhance symmetrically. Symmetric and uniform excretion. No concerning focal renal lesion. Bilateral renal cortical scarring. Nonobstructing calculi present in both kidneys. No obstructive urolithiasis or hydronephrosis is seen at this time. Bladder is unremarkable for the degree of distention. Stomach/Bowel: Paraesophageal venous collaterals and varices. Some minimal collateralization and varices about the gastric fundus as well. Thickening towards the gastric antrum likely reflecting normal peristalsis. Air and fluid-filled appearance of the duodenum with a normal sweep across the midline abdomen. Slightly edematous appearance diffusely through the small bowel is nonspecific given additional findings above. Moderate right-sided colonic stool burden, particularly within the cecum. Some mild circumferential thickening and mucosal hyperemia about the cecum and ascending colon is nonspecific given hepatic features. No evidence of mechanical obstruction. Appendix is not well visualized. Vascular/Lymphatic:  Atherosclerotic calcifications within the abdominal aorta and branch vessels. No aneurysm or ectasia. Paraesophageal and gastric varices with venous collaterals, as above. Splenorenal collateralization. Upper abdominal varices.No enlarged abdominopelvic lymph nodes. Few scattered calcified lymph nodes are present in the abdomen and pelvis. Reproductive: Portion the pelvis obscured by streak artifact. 2.6 cm cystic focus in the left adnexa, unchanged from comparison. No concerning right adnexal lesion. Prior hysterectomy. Other: Increasing edematous changes of the central  mesentery. Small volume low-attenuation fluid in the deep pelvis as well as trace perihepatic ascites. Circumferential body wall edema. No bowel containing hernia. No free abdominopelvic air. Musculoskeletal: Multilevel degenerative changes are present in the imaged portions of the spine. Grade 1 anterolisthesis L4 on L5 without spondylolysis, unchanged from prior. Prior left hip arthroplasty with chronic bony remodeling and a similar appearance overall to comparison prior. IMPRESSION: Constellation of features suggestive of intrinsic liver disease/cirrhosis with features of portal hypertension including a nodular, heterogeneous liver with paraesophageal and gastric varices, additional upper abdominal venous collateralization, splenorenal shunting and splenomegaly. Interval development of a small volume ascites as well as additional features of at least mild or early developing anasarca with circumferential body wall edema and central mesenteric edema. Some hazy peripancreatic stranding may be present, possibly related to a diffusely edematous appearance of the mesentery though could correlate with lipase. Mildly thickened appearance of the cecum and ascending colon as well as a diffusely edematous appearance of the small bowel is nonspecific in the setting of suspected intrinsic liver disease. Could reflect portal enteropathy/colopathy though  should exclude clinical symptoms of enterocolitis. Bilateral nonobstructing nephrolithiasis. No obstructive urolithiasis or hydronephrosis at this time. Bilateral renal cortical scarring. Prior hysterectomy. Stable 2.7 cm left ovarian cyst. Continued stability favoring a benign process. No follow-up imaging recommended. Note: This recommendation does not apply to premenarchal patients and to those with increased risk (genetic, family history, elevated tumor markers or other high-risk factors) of ovarian cancer. Reference: JACR 2020 Feb; 17(2):248-254 Aortic Atherosclerosis (ICD10-I70.0). Electronically Signed   By: Lovena Le M.D.   On: 05/13/2020 00:35   US Abdomen Limited  Result Date: 05/13/2020 CLINICAL DATA:  Right upper quadrant pain. EXAM: ULTRASOUND ABDOMEN LIMITED RIGHT UPPER QUADRANT COMPARISON:  February 23, 2020 ultrasound.  CT scan May 12, 2020. FINDINGS: Gallbladder: A small amount of sludge is seen in the gallbladder. No stones, Murphy's sign, or wall thickening. A small amount of ascites is identified, including adjacent to the gallbladder. Common bile duct: Diameter: 5 mm Liver: There is a nodular contour in the liver. No focal mass. Portal vein is patent on color Doppler imaging with normal direction of blood flow towards the liver. Other: Mild ascites. IMPRESSION: 1. Nodular contour to the liver suggesting cirrhosis.  Mild ascites. 2. Mild sludge in otherwise normal appearing gallbladder. A small amount of fluid adjacent to the gallbladder is likely due to the ascites. Electronically Signed   By: Dorise Bullion III M.D   On: 05/13/2020 09:06   US Paracentesis  Result Date: 06/06/2020 INDICATION: Ascites EXAM: ULTRASOUND GUIDED DIAGNOSTIC AND THERAPEUTIC PARACENTESIS MEDICATIONS: 10 cc 1% lidocaine COMPLICATIONS: None immediate. PROCEDURE: Informed written consent was obtained from the patient after a discussion of the risks, benefits and alternatives to treatment. A timeout was  performed prior to the initiation of the procedure. Initial ultrasound scanning demonstrates a large amount of ascites within the right lower abdominal quadrant. The right lower abdomen was prepped and draped in the usual sterile fashion. 1% lidocaine was used for local anesthesia. Following this, a Yueh catheter was introduced. An ultrasound image was saved for documentation purposes. The paracentesis was performed. The catheter was removed and a dressing was applied. The patient tolerated the procedure well without immediate post procedural complication. Patient received post-procedure intravenous albumin; see nursing notes for details. FINDINGS: A total of approximately 3 liters of yellow fluid was removed. Samples were sent to the laboratory as requested by the clinical team. IMPRESSION: Successful ultrasound-guided paracentesis yielding  3 liters of peritoneal fluid. Read by Lavonia Drafts [facility] Electronically Signed   By: Lavonia Dana M.D.   On: 06/06/2020 12:01   US Paracentesis  Result Date: 05/14/2020 INDICATION: Fever, abdominal pain, question spontaneous bacterial peritonitis, ascites on CT EXAM: ULTRASOUND GUIDED DIAGNOSTIC PARACENTESIS MEDICATIONS: None COMPLICATIONS: None immediate PROCEDURE: Informed written consent was obtained from the patient after a discussion of the risks, benefits and alternatives to treatment. A timeout was performed prior to the initiation of the procedure. Initial ultrasound scanning demonstrates a small amount of ascites within the right lower abdominal quadrant. The right lower abdomen was prepped and draped in the usual sterile fashion. 1% lidocaine was used for local anesthesia. Following this, a 5 [place] Yueh catheter was introduced. An ultrasound image was saved for documentation purposes. The paracentesis was performed. The catheter was removed and a dressing was applied. The patient tolerated the procedure well without immediate post procedural complication. FINDINGS:  A total of approximately 20 mL of clear yellow ascitic fluid was removed. Samples were sent to the laboratory as requested by the clinical team. IMPRESSION: Successful ultrasound-guided paracentesis yielding 20 mL of peritoneal fluid for diagnostic testing. Electronically Signed   By: Lavonia Dana M.D.   On: 05/14/2020 12:39   DG CHEST PORT 1 VIEW  Result Date: 06/04/2020 CLINICAL DATA:  PICC line placement EXAM: PORTABLE CHEST 1 VIEW COMPARISON:  04/03/2020 FINDINGS: Right upper DVT PICC now terminates in the region of the right subclavian artery. Cardiomediastinal silhouette and pulmonary vasculature within normal limits. Diffuse bilateral airspace opacities similar to prior examination. Hyperdense content of the colon likely related to barium utilized during swallow study. IMPRESSION: Right upper extremity PICC terminates in the region of the right subclavian vein. Unchanged diffuse bilateral airspace opacities. These results will be called to the ordering clinician or representative by the Radiologist Assistant, and communication documented in the PACS or [employer]. Electronically Signed   By: Miachel Roux M.D.   On: 06/04/2020 15:56   DG Chest Port 1 View  Result Date: 05/14/2020 CLINICAL DATA:  55 year old female found down.  Weakness. EXAM: PORTABLE CHEST 1 VIEW COMPARISON:  Portable chest 05/24/2020 and earlier. Modified barium swallow 05/25/2020. FINDINGS: Residual barium mixed with retained stool in the upper abdominal large bowel. Continued low lung volumes. Stable right PICC line. Mediastinal contours remain normal. Diffuse coarse bilateral pulmonary opacity appears to be chronic and stable over this year's series of exams. No superimposed pneumothorax, pleural effusion or confluent pulmonary opacity. Visualized tracheal air column is within normal limits. No acute osseous abnormality identified. IMPRESSION: 1. Continued low lung volumes and coarse bilateral pulmonary interstitial markings  which do not appear significantly changed since January. Favor a chronic interstitial lung disease over an acute pulmonary process. 2. PICC line remains in place. 3. Residual barium from a modified swallow 9 days ago mixed with residual stool in the colon. Electronically Signed   By: Genevie Ann M.D.   On: 05/29/2020 07:27   DG CHEST PORT 1 VIEW  Result Date: 05/24/2020 CLINICAL DATA:  55 year old female with tachycardia. EXAM: PORTABLE CHEST 1 VIEW COMPARISON:  Chest radiograph dated 05/22/2020. FINDINGS: Right-sided PICC with tip at the cavoatrial junction. No interval change in bilateral interstitial reticulonodular densities. No pleural effusion or pneumothorax. Stable cardiomediastinal silhouette. No acute osseous pathology. IMPRESSION: No interval change in the bilateral interstitial reticulonodular densities. Electronically Signed   By: Anner Crete M.D.   On: 05/24/2020 01:33   DG CHEST PORT 1 VIEW  Result Date: 05/22/2020 CLINICAL DATA:  Cough EXAM: PORTABLE CHEST 1 VIEW COMPARISON:  Chest radiograph dated 05/20/2020. FINDINGS: The heart size and mediastinal contours are within normal limits. Moderate bilateral interstitial and airspace opacities appear unchanged since prior exam. A right upper extremity peripherally inserted central venous catheter tip overlies the superior cavoatrial junction. IMPRESSION: Moderate bilateral interstitial and airspace opacities are not significantly changed and likely represent pulmonary edema. Electronically Signed   By: Zerita Boers M.D.   On: 05/22/2020 16:46   DG CHEST PORT 1 VIEW  Result Date: 05/20/2020 CLINICAL DATA:  Pulmonary edema EXAM: PORTABLE CHEST 1 VIEW COMPARISON:  05/16/2020 chest radiograph. FINDINGS: Right PICC terminates over the right atrium. Stable cardiomediastinal silhouette with normal heart size. No pneumothorax. No pleural effusion. Patchy hazy opacities throughout both lungs, significantly improved. IMPRESSION: Significantly improved  patchy hazy opacities throughout both lungs, compatible with improving pulmonary edema. Electronically Signed   By: Ilona Sorrel M.D.   On: 05/20/2020 07:32   DG CHEST PORT 1 VIEW  Result Date: 05/16/2020 CLINICAL DATA:  Shortness of breath. EXAM: PORTABLE CHEST 1 VIEW COMPARISON:  Chest x-ray dated May 12, 2020. FINDINGS: The heart size and mediastinal contours are within normal limits. Prominent diffuse interstitial thickening with bilateral perihilar opacities. Small bilateral pleural effusions. No pneumothorax. No acute osseous abnormality. IMPRESSION: 1. Moderate to severe pulmonary edema with small bilateral pleural effusions. Electronically Signed   By: Titus Dubin M.D.   On: 05/16/2020 12:57   DG Chest Portable 1 View  Result Date: 05/12/2020 CLINICAL DATA:  Right sided rib pain. EXAM: PORTABLE CHEST 1 VIEW COMPARISON:  February 12, 2020 FINDINGS: A very mild amount of linear atelectasis is seen within the right lung base. There is no evidence of acute infiltrate, pleural effusion or pneumothorax. The heart size and mediastinal contours are within normal limits. A chronic seventh right rib fracture is seen. IMPRESSION: 1. Very mild right basilar linear atelectasis. 2. Chronic seventh right rib fracture. Electronically Signed   By: Virgina Norfolk M.D.   On: 05/12/2020 23:16   DG Swallowing Func-Speech Pathology  Result Date: 05/25/2020 Objective Swallowing Evaluation: Type of Study: MBS-Modified Barium Swallow Study  Patient Details Name: JUDITHANN VILLAMAR MRN: [id number] Date of Birth: October 28, 1965 Today's Date: 05/25/2020 Time: SLP Start Time (ACUTE ONLY): 1436 -SLP Stop Time (ACUTE ONLY): 1505 SLP Time Calculation (min) (ACUTE ONLY): 29 min Past Medical History: Past Medical History: Diagnosis Date . Arthritis   "knees" (04/22/2012) . Brain tumor (benign) ([place])  . Chronic bronchitis ([place])   "yearly; when the weather changes" (04/22/2012) . Colon polyp  . Colon polyps   adenomatous and  hyperplastic- . Depression  . Eczema  . Epilepsy ([place])   "been having them right often here lately" (04/22/2012) . Fatty liver  . High cholesterol  . History of kidney stones  . Osteoarthritis   [name] 04/22/2012 . Other convulsions 05/21/12  non-epileptic spells . Restless leg  . Seizures ([place])  . Vertigo  Past Surgical History: Past Surgical History: Procedure Laterality Date . ABDOMINAL HYSTERECTOMY  2001 . BLADDER SUSPENSION   . BUNIONECTOMY Left 2000 . CESAREAN SECTION  1987; 1988 . COLONOSCOPY   . JOINT REPLACEMENT   . MASS EXCISION  10/22/2011  Procedure: EXCISION MASS;  Surgeon: [name], MD;  Location: [facility];  Service: General;  Laterality: Right;  excision right buttock mass . TEE WITHOUT CARDIOVERSION N/A 05/16/2020  Procedure: TRANSESOPHAGEAL ECHOCARDIOGRAM (TEE) WITH PROPOFOL;  Surgeon: Arnoldo Lenis, MD;  Location: [facility];  Service: Endoscopy;  Laterality: N/A; . TOTAL HIP ARTHROPLASTY Left 1993; 1995; 2000 . TOTAL KNEE ARTHROPLASTY Left 05/07/2015  Procedure: TOTAL LEFT KNEE ARTHROPLASTY;  Surgeon: [name], MD;  Location: [facility];  Service: Orthopedics;  Laterality: Left; . TOTAL KNEE ARTHROPLASTY Right 10/01/2015  Procedure: RIGHT TOTAL KNEE ARTHROPLASTY;  Surgeon: [name], MD;  Location: [facility];  Service: Orthopedics;  Laterality: Right; HPI: 55 year old female with a history of COPD, meningioma, cardiac arrest December 2020, hepatic steatosis, PNES, cognitive impairment, depression, complex partial seizure presenting with generalized weakness and a fall.  This has been a recurrent problem for the patient.  She was admitted on 2/7 to 02/24/20 with a similar presentation.  At that time she was treated for acute metabolic encephalopathy which was thought to be multifactorial including elevated ammonia, dehydration and dopamine agonist.  BSE requested  No data recorded Assessment / Plan / Recommendation CHL IP CLINICAL IMPRESSIONS 05/25/2020 Clinical Impression Pt presents with moderate  oropharyngeal dysphagia characterized by silent aspiration of thin liquids; amount of aspiration is dependent on presentation type and bolus size. Pt demonstrates premature spillage, decreased epiglottic deflection, decreased laryngeal vestibule closure and decreased pharyngeal squeeze. Tsp sips of thin liquids result in trace flash penetration, cup sips result in penetration with trace amounts falling to the cords that are not always visualized being cleared from the laryngeal vestibule; straw sips of thin result in moderate amounts falling to the cords and eventually being aspirated. Pt also demonstrated a moderate amount of aspiration of a cup sip of thin with the barium tablet administration (this unfortunately occurred prior to floro being turned on, however evidence of aspiration present on the tracheal wall); All aspiration was silent and required verbal cues for Pt to cough. Further, cough was inconsitently effective in clearing aspirates. With NTL Pt demonstrated occasional flash penetration and puree and regular textures were consumed without incident. Recommend continue with a D2/fine chop diet and NTL. Pt is cognitively unable to consistently utilize strategies. SLP will continue to follow acutely SLP Visit Diagnosis Dysphagia, unspecified (R13.10) Attention and concentration deficit following -- Frontal lobe and executive function deficit following -- Impact on safety and function Mild aspiration risk;Moderate aspiration risk   CHL IP TREATMENT RECOMMENDATION 05/25/2020 Treatment Recommendations F/U MBS in --- days (Comment);Therapy as outlined in treatment plan below   Prognosis 05/25/2020 Prognosis for Safe Diet Advancement Good Barriers to Reach Goals Cognitive deficits Barriers/Prognosis Comment -- CHL IP DIET RECOMMENDATION 05/25/2020 SLP Diet Recommendations Dysphagia 2 (Fine chop) solids;Nectar thick liquid Liquid Administration via Cup;Straw Medication Administration Whole meds with puree  Compensations Minimize environmental distractions;Slow rate;Small sips/bites Postural Changes Seated upright at 90 degrees   CHL IP OTHER RECOMMENDATIONS 05/25/2020 Recommended Consults -- Oral Care Recommendations Oral care BID Other Recommendations Order thickener from pharmacy   CHL IP FOLLOW UP RECOMMENDATIONS 05/25/2020 Follow up Recommendations 24 hour supervision/assistance   CHL IP FREQUENCY AND DURATION 05/25/2020 Speech Therapy Frequency (ACUTE ONLY) min 1 x/week Treatment Duration 1 week      CHL IP ORAL PHASE 05/25/2020 Oral Phase Impaired Oral - Pudding Teaspoon -- Oral - Pudding Cup -- Oral - Honey Teaspoon -- Oral - Honey Cup -- Oral - Nectar Teaspoon Premature spillage;Decreased bolus cohesion;Piecemeal swallowing Oral - Nectar Cup Premature spillage;Decreased bolus cohesion;Piecemeal swallowing Oral - Nectar Straw NT Oral - Thin Teaspoon Decreased bolus cohesion;Premature spillage;Piecemeal swallowing Oral - Thin Cup Premature spillage;Decreased bolus cohesion;Piecemeal swallowing Oral - Thin Straw Premature spillage;Decreased bolus cohesion;Piecemeal swallowing Oral -  Puree WFL Oral - Mech Soft -- Oral - Regular Piecemeal swallowing Oral - Multi-Consistency -- Oral - Pill (No Data) Oral Phase - Comment --  CHL IP PHARYNGEAL PHASE 05/25/2020 Pharyngeal Phase Impaired Pharyngeal- Pudding Teaspoon -- Pharyngeal -- Pharyngeal- Pudding Cup -- Pharyngeal -- Pharyngeal- Honey Teaspoon -- Pharyngeal -- Pharyngeal- Honey Cup -- Pharyngeal -- Pharyngeal- Nectar Teaspoon NT Pharyngeal -- Pharyngeal- Nectar Cup Pharyngeal residue - valleculae;Pharyngeal residue - pyriform;Penetration/Aspiration during swallow Pharyngeal Material enters airway, remains ABOVE vocal cords then ejected out Pharyngeal- Nectar Straw NT Pharyngeal -- Pharyngeal- Thin Teaspoon Pharyngeal residue - valleculae;Pharyngeal residue - pyriform;Penetration/Aspiration during swallow;Penetration/Aspiration before swallow;Trace aspiration;Reduced  epiglottic inversion;Delayed swallow initiation-pyriform sinuses;Reduced pharyngeal peristalsis;Reduced airway/laryngeal closure Pharyngeal Material enters airway, passes BELOW cords without attempt by patient to eject out (silent aspiration) Pharyngeal- Thin Cup Pharyngeal residue - valleculae;Pharyngeal residue - pyriform;Penetration/Aspiration during swallow;Penetration/Aspiration before swallow;Trace aspiration;Reduced epiglottic inversion;Delayed swallow initiation-pyriform sinuses;Reduced pharyngeal peristalsis;Reduced airway/laryngeal closure Pharyngeal Material enters airway, passes BELOW cords without attempt by patient to eject out (silent aspiration) Pharyngeal- Thin Straw Pharyngeal residue - valleculae;Pharyngeal residue - pyriform;Penetration/Aspiration during swallow;Penetration/Aspiration before swallow;Reduced epiglottic inversion;Delayed swallow initiation-pyriform sinuses;Reduced pharyngeal peristalsis;Reduced airway/laryngeal closure;Moderate aspiration Pharyngeal Material enters airway, passes BELOW cords without attempt by patient to eject out (silent aspiration) Pharyngeal- Puree Pharyngeal residue - valleculae Pharyngeal -- Pharyngeal- Mechanical Soft NT Pharyngeal -- Pharyngeal- Regular Pharyngeal residue - valleculae Pharyngeal -- Pharyngeal- Multi-consistency -- Pharyngeal -- Pharyngeal- Pill -- Pharyngeal -- Pharyngeal Comment --  CHL IP CERVICAL ESOPHAGEAL PHASE 05/25/2020 Cervical Esophageal Phase WFL Pudding Teaspoon -- Pudding Cup -- Honey Teaspoon -- Honey Cup -- Nectar Teaspoon -- Nectar Cup -- Nectar Straw -- Thin Teaspoon -- Thin Cup -- Thin Straw -- Puree -- Mechanical Soft -- Regular -- Multi-consistency -- Pill -- Cervical Esophageal Comment -- Amelia H. Roddie Mc, CCC-SLP Speech Language Pathologist Wende Bushy 05/25/2020, 4:05 PM              ECHOCARDIOGRAM COMPLETE  Result Date: 05/15/2020    ECHOCARDIOGRAM REPORT   Patient Name:   [name] Date of Exam:  05/15/2020 Medical Rec #:  [id number]       Height:       60.0 in Accession #:    [id number]      Weight:       127.9 lb Date of Birth:  17-Nov-1965        BSA:          1.543 m Patient Age:    81 years        BP:           88/64 mmHg Patient Gender: F               HR:           95 bpm. Exam Location:  [facility] Procedure: 2D Echo, Cardiac Doppler and Color Doppler Indications:    Bacteremia R78.81  History:        Patient has prior history of Echocardiogram examinations, most                 recent 02/23/2020. Risk Factors:Dyslipidemia. Cardiac Arrest,                 Elevated Liver Function Test.  Sonographer:    Alvino Chapel RCS Referring Phys: [id number] [place]  1. Left ventricular ejection fraction, by estimation, is 60 to 65%. The left ventricle has normal function. The left ventricle has no regional wall motion abnormalities. Left  ventricular diastolic parameters are indeterminate.  2. Right ventricular systolic function is normal. The right ventricular size is normal.  3. Left atrial size was moderately dilated.  4. The mitral valve is normal in structure. No evidence of mitral valve regurgitation. No evidence of mitral stenosis.  5. The aortic valve has an indeterminant number of cusps. Aortic valve regurgitation is not visualized. No aortic stenosis is present.  6. The inferior vena cava is normal in size with greater than 50% respiratory variability, suggesting right atrial pressure of 3 mmHg. FINDINGS  Left Ventricle: Left ventricular ejection fraction, by estimation, is 60 to 65%. The left ventricle has normal function. The left ventricle has no regional wall motion abnormalities. The left ventricular internal cavity size was normal in size. There is  no left ventricular hypertrophy. Left ventricular diastolic parameters are indeterminate. Right Ventricle: The right ventricular size is normal. No increase in right ventricular wall thickness. Right ventricular systolic function is  normal. Left Atrium: Left atrial size was moderately dilated. Right Atrium: Right atrial size was not well visualized. Pericardium: There is no evidence of pericardial effusion. Mitral Valve: The mitral valve is normal in structure. No evidence of mitral valve regurgitation. No evidence of mitral valve stenosis. Tricuspid Valve: The tricuspid valve is normal in structure. Tricuspid valve regurgitation is not demonstrated. No evidence of tricuspid stenosis. Aortic Valve: The aortic valve has an indeterminant number of cusps. Aortic valve regurgitation is not visualized. No aortic stenosis is present. Aortic valve mean gradient measures 5.1 mmHg. Aortic valve peak gradient measures 8.2 mmHg. Aortic valve area, by VTI measures 2.13 cm. Pulmonic Valve: The pulmonic valve was not well visualized. Pulmonic valve regurgitation is not visualized. No evidence of pulmonic stenosis. Aorta: The aortic root is normal in size and structure. Pulmonary Artery: Inadequate TR jet, indeterminant PASP. Venous: The inferior vena cava is normal in size with greater than 50% respiratory variability, suggesting right atrial pressure of 3 mmHg. IAS/Shunts: No atrial level shunt detected by color flow Doppler.  LEFT VENTRICLE PLAX 2D LVIDd:         4.80 cm  Diastology LVIDs:         3.10 cm  LV e' medial:    10.60 cm/s LV PW:         1.00 cm  LV E/e' medial:  11.3 LV IVS:        1.00 cm  LV e' lateral:   9.14 cm/s LVOT diam:     1.80 cm  LV E/e' lateral: 13.1 LV SV:         64 LV SV Index:   41 LVOT Area:     2.54 cm  RIGHT VENTRICLE RV S prime:     16.30 cm/s TAPSE (M-mode): 2.6 cm LEFT ATRIUM             Index       RIGHT ATRIUM           Index LA diam:        4.20 cm 2.72 cm/m  RA Area:     14.20 cm LA Vol (A2C):   53.8 ml 34.86 ml/m RA Volume:   36.10 ml  23.39 ml/m LA Vol (A4C):   64.2 ml 41.60 ml/m LA Biplane Vol: 62.5 ml 40.49 ml/m  AORTIC VALVE AV Area (Vmax):    2.40 cm AV Area (Vmean):   2.00 cm AV Area (VTI):     2.13  cm AV Vmax:  143.34 cm/s AV Vmean:          109.425 cm/s AV VTI:            0.301 m AV Peak Grad:      8.2 mmHg AV Mean Grad:      5.1 mmHg LVOT Vmax:         135.00 cm/s LVOT Vmean:        86.200 cm/s LVOT VTI:          0.251 m LVOT/AV VTI ratio: 0.84  AORTA Ao Root diam: 3.00 cm MITRAL VALVE MV Area (PHT): 3.97 cm     SHUNTS MV Decel Time: 191 msec     Systemic VTI:  0.25 m MV E velocity: 120.00 cm/s  Systemic Diam: 1.80 cm MV A velocity: 87.30 cm/s MV E/A ratio:  1.37 Carlyle Dolly MD Electronically signed by Carlyle Dolly MD Signature Date/Time: 05/15/2020/2:16:14 PM    Final    ECHO TEE  Result Date: 05/16/2020    TRANSESOPHOGEAL ECHO REPORT   Patient Name:   [name] Date of Exam: 05/16/2020 Medical Rec #:  [id number]       Height:       60.0 in Accession #:    [id number]      Weight:       127.9 lb Date of Birth:  1965-05-13        BSA:          1.543 m Patient Age:    78 years        BP:           123/85 mmHg Patient Gender: F               HR:           101 bpm. Exam Location:  [facility] Procedure: Transesophageal Echo, Cardiac Doppler and Color Doppler Indications:    Bacteremia  History:        Patient has prior history of Echocardiogram examinations, most                 recent 05/15/2020. Risk Factors:Dyslipidemia. Cardiac Arrest,                 Elevated Liver Function Test.  Sonographer:    Alvino Chapel RCS Referring Phys: [id number] [place]: The transesophogeal probe was passed without difficulty through the esophogus of the patient. Sedation performed by different physician. The patient developed no complications during the procedure. IMPRESSIONS  1. Left ventricular ejection fraction, by estimation, is 60 to 65%. The left ventricle has normal function.  2. Right ventricular systolic function is normal. The right ventricular size is normal.  3. Left atrial size was mildly dilated. No left atrial/left atrial appendage thrombus was detected. The LAA emptying  velocity was 85 cm/s.  4. Right atrial size was mildly dilated.  5. The mitral valve is normal in structure. Trivial mitral valve regurgitation. No evidence of mitral stenosis.  6. The aortic valve is tricuspid. Aortic valve regurgitation is not visualized. No aortic stenosis is present. Conclusion(s)/Recommendation(s): No evidence of vegetation/infective endocarditis on this transesophageal echocardiogram. FINDINGS  Left Ventricle: Left ventricular ejection fraction, by estimation, is 60 to 65%. The left ventricle has normal function. The left ventricular internal cavity size was normal in size. Right Ventricle: The right ventricular size is normal. No increase in right ventricular wall thickness. Right ventricular systolic function is normal. Left Atrium: Left atrial size was mildly dilated. No left atrial/left  atrial appendage thrombus was detected. The LAA emptying velocity was 85 cm/s. Right Atrium: Right atrial size was mildly dilated. Pericardium: There is no evidence of pericardial effusion. Mitral Valve: The mitral valve is normal in structure. Trivial mitral valve regurgitation. No evidence of mitral valve stenosis. Tricuspid Valve: The tricuspid valve is normal in structure. Tricuspid valve regurgitation is trivial. No evidence of tricuspid stenosis. Aortic Valve: The aortic valve is tricuspid. Aortic valve regurgitation is not visualized. No aortic stenosis is present. Pulmonic Valve: The pulmonic valve was normal in structure. Pulmonic valve regurgitation is not visualized. No evidence of pulmonic stenosis. Aorta: The aortic root is normal in size and structure. IAS/Shunts: No atrial level shunt detected by color flow Doppler. Carlyle Dolly MD Electronically signed by Carlyle Dolly MD Signature Date/Time: 05/16/2020/1:28:24 PM    Final    [name] ASCITES (ABDOMEN LIMITED)  Result Date: 06/04/2020 CLINICAL DATA:  Cirrhosis, ascites EXAM: LIMITED ABDOMEN ULTRASOUND FOR ASCITES TECHNIQUE: Limited  ultrasound survey for ascites was performed in all four abdominal quadrants. COMPARISON:  05/14/2020 FINDINGS: Small volume ascites identified in the lower quadrants bilaterally. More ascites is seen at the midline at the pelvis. Volume of ascites is sufficient for diagnostic paracentesis. IMPRESSION: Sufficient ascites for diagnostic paracentesis. Electronically Signed   By: Lavonia Dana M.D.   On: 05/14/2020 16:16   [name] ASCITES (ABDOMEN LIMITED)  Result Date: 05/14/2020 CLINICAL DATA:  Ascites, question sufficient for paracentesis, clinical concern for spontaneous bacterial peritonitis EXAM: LIMITED ABDOMEN ULTRASOUND FOR ASCITES TECHNIQUE: Limited ultrasound survey for ascites was performed in all four abdominal quadrants. COMPARISON:  CT abdomen and pelvis 05/13/2020 FINDINGS: Small volume ascites identified in the lower quadrants bilaterally. Volume of ascites has probably slightly increased since prior CT. While the volume of fluid is insufficient for therapeutic paracentesis, a small volume of ascites may be obtainable for diagnostic purposes. Diagnostic paracentesis will be attempted. IMPRESSION: Small volume ascites, likely sufficient volume for diagnostic paracentesis. Electronically Signed   By: Lavonia Dana M.D.   On: 05/14/2020 11:11   [name] EKG SITE RITE  Result Date: 05/17/2020 If Site Rite image not attached, placement could not be confirmed due to current cardiac rhythm. [2 weeks]   Assessment: 55 year old female with history of skull base hemangioma, PNES, seizures, COPD, recently inpatient for severe sepsis due to MSSA bacteremia and discharged on 5/15 receiving Ancef through PICC line, cirrhosis most likely due to fatty liver and had a actually establish care with Dr. Tarri Glenn in March 2022, CT abdomen and pelvis with contrast April 2022 with cirrhosis, paraesophageal and gastric varices.  Presented to the ED being found down at the skilled nursing facility.  Admitted with acute  encephalopathy, acute renal injury, urinary retention.  GI consulted for acute vomiting of bright red blood morning of 06/04/2020.  Cirrhosis: Likely due to fatty liver but evaluation in process by Dr. Tarri Glenn, recent outpatient evaluation.  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 [facility] this admission 10.  INR 2.3, given vitamin K and INR improved to 1.5.    Hepatic encephalopathy: ammonia level has been fluctuating.  Mental status can be somewhat difficult to evaluate given other neurological issues. Receiving ativan about twice per day. Patient has been receiving about 75% of her lactulose, 50% of her Xifaxan staff is have difficulty getting her to take her oral medication.  Missing additional oral medications on a regular basis. She had 3 [place] 7 BMs in the last 12 hours.  Ammonia level this morning 110.  Acute  upper GI bleed: Hematemesis morning of 06/04/2020.  Octreotide and Protonix infusion was initiated.  SBP prophylaxis added, and way of ceftriaxone.  EGD showed no active bleeding or stigmata of bleeding in the entire upper GI tract.  She had esophagitis but no bleeding.  She had moderate portal hypertensive gastropathy.  No esophageal or gastric varices identified.  Biopsy showed mild chronic gastritis, negative for H. pylori.  She has completed octreotide drip.  PPI has been switched to twice daily.  She had 3 BMs in the last 12 hours, not having any stools documented for couple of days.  Initial stool was black with red appearance, subsequent to stools were brown/red. Discussed nursing staff that states nothing discussed in report except fresh blood appears to be coming from mild rectal prolapse or hemorrhoid.  Elevated LFTs: AST/ALT improved from last admission.  Total bilirubin slightly increased.  Have not been checked in a couple of days.     Plan: 1. Palliative care has been involved.  Current plans are discharged to home with palliative services.  Goal to treat the treatable  but avoid CPR/intubation. Prognosis poor. 2. Update CBC, LFTs, INR today. 3. Encouraged nursing staff to try to continue Xifaxan 550 mg twice daily if patient alert enough and will take.  To be provided even if she has loose stools.  Titrate oral lactulose to 2-3 soft stools daily. Consider couple of lactulose enemas due to decreased oral intake.  4. Patient with new fever, unlikely SBP given recent tap with unremarkable cell count and cultures remain negative. Work up per attending. 5. Complete 7 days of antibiotics for SBP prophylaxis. 6. Consider transition oral medications to IV if possible given she is missing a lot of her medications.    Laureen Ochs. Bernarda Caffey [facility] [phone] 5/27/202210:35 AM

## 2020-06-08 NOTE — Progress Notes (Signed)
PROGRESS NOTE    [name]  HBZ:[id number] DOB: December 15, 1965 DOA: 06/11/2020 PCP: Caprice Renshaw, MD   Brief Narrative:   [name] a 55 y.o.femalewith medical history significant forCOPD, meningioma, cardiac arrest December 2020, hepatic steatosis,PNES, cognitive impairment, depression,andcomplex partial seizurewho presented to the ED after being found down at her skilled facility.  She was admitted with acute hepatic encephalopathy.  5/23 she did develop some hematemesis and has undergone EGD on 5/24 with findings of some mild esophagitis as well as gastritis.  She has been restarted on a diet with oral PPI.  Paracentesis ordered for today.  Family would like to take patient home with home health and outpatient palliative care once stable for discharge.  Assessment & Plan:   Active Problems:   Cirrhosis ([place])   Acute hepatic encephalopathy   UGI bleed   Acute hepatic encephalopathy -Continue on lactulose and monitor ammonia levels which are fluctuating (elevated today). -Uncertain if she has been taking her medications or not versus need for adjustment. -Continue supportive care. -Per records and based on patient inability to take medications orally at times she is receiving less rifaximin and lactulose, of what has been ordered per GI recommendations. -?? Needing lactulose enemas.  Hematemesis-resolved -Status post EGD 5/24 with findings of esophagitis and gastritis, but no active bleeding -Octreotide and protonix infusions to discontinue -Oral PPI BID -Monitor H/H and transfuse for H<7 -Soft diet restarted  AKI -resolved -Likely prerenal -continue avoiding nephrotoxic agents and monitor intake and output -continue to follow renal function intermittently -encouraged adequate hydration   Mild hypernatremia -will change maintenance IVF's to D5W  Lactic acidosis -stable -Likely related to dehydration and poor oral intake -Continue to  monitor -maintaining adequate hydration is difficult with lack of proper oral intake.  MSSA bacteremia -was on Ancef through 5/29 to finish treatment; per GI rec's started on Rocephin which will cover SBP and MSSA. -no fever.  Urinary retention -Foley catheter in place from prior admission -No signs of UTI currently noted -Plan to attempt voiding trial once more awake and alert  Chronic pain -Continue holding analgesics current regimen until more alert and awake  Liver cirrhosis -Some ascites for which paracentesis ordered -Elevated ammonia levels as noted below, despite lactulose; concerns or not receiving enough given intermittent episode of being obtunded and unable to take oral medication. -ammonia 110 today; -Will initiate lactulose enema. -Started on Rocephin per GI for SBP prophylaxis -No signs of SBP on recent paracentesis. -overall with poor prognosis -GI recommending palliative/hospice at discharge.  Anemia of chronic disease -stable currently. -Transfuse for hemoglobin less than 7 -No overt bleeding noted currently -Continue to follow hemoglobin trend.  Recurrent gait instability with frequent falls/dizziness -CT head with no acute findings -Patient to go home with home health  Complex partial seizures/PNES -Continue home Keppra and Lamictal -No seizure activity appreciated at this time.  Depression -Resume fluoxetine and lorazepam  Meningioma -Follows with Dr. [name] outpatient  Transaminitis -Appears to be a persistent problem in the setting of liver cirrhosis/NAFLD with hepatic congestion -Continue to monitor  Restless leg -Continue holding Mirapex due to prior confusion  DVT prophylaxis:  SCDs Code Status: DNR Family Communication: Discussed with family at bedside 5/24 Disposition Plan:  Status is: Inpatient  Remains inpatient appropriate because:Ongoing diagnostic testing needed not appropriate for outpatient work up, IV treatments  appropriate due to intensity of illness or inability to take PO and Inpatient level of care appropriate due to severity of illness   Dispo:  The patient is from: SNF  Anticipated d/c is to: Home with home health and outpt palliative  Patient currently is not medically stable to d/c yet.              Difficult to place patient No  Skin Assessment:  I have examined the patient's skin and I agree with the wound assessment as performed by the wound care RN as outlined below:  Pressure Injury 05/25/20 Sacrum Mid Stage 1 -  Intact skin with non-blanchable redness of a localized area usually over a bony prominence. (Active)  05/25/20 1746  Location: Sacrum  Location Orientation: Mid  Staging: Stage 1 -  Intact skin with non-blanchable redness of a localized area usually over a bony prominence.  Wound Description (Comments):   Present on Admission: No    Consultants:   GI  Palliative  Procedures:   See below  Antimicrobials:  Anti-infectives (From admission, onward)   Start     Dose/Rate Route Frequency Ordered Stop   06/07/20 1000  cefTRIAXone (ROCEPHIN) 2 g in sodium chloride 0.9 % 100 mL IVPB        2 g 200 mL/hr over 30 Minutes Intravenous Daily 06/06/20 1315 06/11/20 0959   06/06/20 1415  cefTRIAXone (ROCEPHIN) 1 g in sodium chloride 0.9 % 100 mL IVPB        1 g 200 mL/hr over 30 Minutes Intravenous  Once 06/06/20 1315 06/06/20 1402   06/09/2020 2200  rifaximin (XIFAXAN) tablet 550 mg        550 mg Oral 2 times daily 06/06/2020 1524     06/04/20 1330  cefTRIAXone (ROCEPHIN) 1 g in sodium chloride 0.9 % 100 mL IVPB  Status:  Discontinued        1 g 200 mL/hr over 30 Minutes Intravenous Daily 06/04/20 1320 06/06/20 1315   05/29/2020 1400  ceFAZolin (ANCEF) IVPB 2g/100 mL premix  Status:  Discontinued       Note to Pharmacy: Indication:  MSSA bacteremia  First Dose: Yes Last Day of Therapy:  Jun 10, 2020 Labs - Once weekly:  CBC/D and BMP, Labs -  Every other week:  ESR and CRP Method of administration: IV Push Method of administration may be changed at the discretion of home infusion pharmacist based upon assessme   2 g 200 mL/hr over 30 Minutes Intravenous Every 8 hours 05/30/2020 1044 06/06/20 1315      Subjective: Lethargic, not following commands, having trouble taking oral medications and today noticed with low-grade temperature.  No nausea, no vomiting, no chest pain.  Objective: Vitals:   06/07/20 1948 06/07/20 2226 06/08/20 0154 06/08/20 0416  BP:  (!) 140/97 (!) 134/98 123/82  Pulse:  (!) 106 (!) 109 (!) 108  Resp:   20 20  Temp:  99.4 F (37.4 C) (!) 100.9 F (38.3 C) 99.9 F (37.7 C)  TempSrc:  Oral Axillary   SpO2: 98% 97% 97% 96%  Weight:      Height:        Intake/Output Summary (Last 24 hours) at 06/08/2020 1232 Last data filed at 06/08/2020 0857 Gross per 24 hour  Intake 1666.56 ml  Output 550 ml  Net 1116.56 ml   Filed Weights   06/02/2020 0544  Weight: 63 kg    Examination: General exam: Chronically ill in appearance; in no acute distress.  Very sleepy today and not answering questions or following commands.  Per nursing report having difficulty to take oral medications.  Low-grade temperature appreciated overnight.  Respiratory system: Good air movement bilaterally; no wheezing, no crackles. Cardiovascular system:RRR. No murmurs, rubs, gallops.  No JVD. Gastrointestinal system: Abdomen is distended, nontender, positive bowel sounds. Central nervous system: Moving 4 limbs spontaneously; no following commands currently. Extremities: No cyanosis or clubbing. Skin: No petechiae. Psychiatry: Unable to properly assess with current mentation.  Data Reviewed: I have personally reviewed following labs and imaging studies  CBC: Recent Labs  Lab 06/06/2020 0605 06/04/20 0533 06/04/20 1532 06/04/20 2049 06/11/2020 0143 05/21/2020 0507 06/06/20 0455 06/08/20 1047  WBC 8.7 9.1  --   --   --  9.0 12.2*  14.6*  NEUTROABS 5.8  --   --   --   --   --   --   --   HGB 8.9* 9.0*   < > 8.9* 8.2* 8.0* 8.3* 8.1*  HCT 27.4* 27.6*   < > 28.2* 25.5* 24.9* 26.1* 25.5*  MCV 96.1 96.5  --   --   --  98.8 98.9 99.6  PLT 214 213  --   --   --  178 207 162   < > = values in this interval not displayed.   Basic Metabolic Panel: Recent Labs  Lab 05/24/2020 0605 06/04/20 0533 06/07/2020 0507 06/06/20 0455 06/08/20 1047  NA 145 147* 145 148* 147*  K 3.5 3.4* 3.5 3.5 3.4*  CL 113* 114* 116* 117* 119*  CO2 _0 GLUCOSE 106* 102* 115* 109* 109*  BUN 39* 29* 23* 19 16  CREATININE 1.39* 1.01* 0.85 0.79 0.75  CALCIUM 7.7* 7.8* 7.3* 7.8* 7.6*  MG  --  2.2 2.0 2.1  --    GFR: Estimated Creatinine Clearance: 66.6 mL/min (by C-G formula based on SCr of 0.75 mg/dL).   Liver Function Tests: Recent Labs  Lab 06/09/2020 0605 06/04/20 0533 06/11/2020 0507 06/06/20 0455 06/08/20 1047  AST 196* 180* 175* 194* 147*  ALT 46* 39 31 30 33  ALKPHOS 160* 155* 146* 148* 146*  BILITOT 2.5* 2.6* 2.7* 3.3* 3.6*  PROT 4.9* 5.2* 4.8* 5.0* 4.8*  ALBUMIN 2.1* 2.3* 2.0* 2.3* 2.1*   Recent Labs  Lab 06/11/2020 0605  LIPASE 111*   Recent Labs  Lab 06/11/2020 0702 06/04/20 0533 06/08/2020 0555 06/06/20 0455 06/08/20 0529  AMMONIA 72* 55* 77* 97* 110*   Coagulation Profile: Recent Labs  Lab 06/04/20 1403 05/30/2020 0748 06/08/20 1047  INR 2.3* 1.5* 1.5*   Sepsis Labs: Recent Labs  Lab 06/06/2020 0702 05/17/2020 0815 06/04/20 0533  LATICACIDVEN 1.6 2.0* 1.9    Recent Results (from the past 240 hour(s))  Culture, blood (routine x 2)     Status: None   Collection Time: 05/13/2020  8:12 AM   Specimen: Cath Site; Blood  Result Value Ref Range Status   Specimen Description CATH SITE BOTTLES DRAWN AEROBIC AND ANAEROBIC  Final   Special Requests Blood Culture adequate volume  Final   Culture   Final    NO GROWTH 5 DAYS Performed at [facility], [address] [place], [place] [address]    Report Status  06/08/2020 FINAL  Final  Culture, blood (routine x 2)     Status: None   Collection Time: 05/26/2020  8:15 AM   Specimen: Left Antecubital; Blood  Result Value Ref Range Status   Specimen Description   Final    LEFT ANTECUBITAL BOTTLES DRAWN AEROBIC AND ANAEROBIC   Special Requests Blood Culture adequate volume  Final   Culture   Final  NO GROWTH 5 DAYS Performed at [facility], [address], [place], [place] [address]    Report Status 06/08/2020 FINAL  Final  SARS CORONAVIRUS 2 (TAT 6-24 HRS) Nasopharyngeal Nasopharyngeal Swab     Status: None   Collection Time: 05/26/2020  8:48 AM   Specimen: Nasopharyngeal Swab  Result Value Ref Range Status   SARS Coronavirus 2 NEGATIVE NEGATIVE Final    Comment: (NOTE) SARS-CoV-2 target nucleic acids are NOT DETECTED.  The SARS-CoV-2 RNA is generally detectable in upper and lower respiratory specimens during the acute phase of infection. Negative results do not preclude SARS-CoV-2 infection, do not rule out co-infections with other pathogens, and should not be used as the sole basis for treatment or other patient management decisions. Negative results must be combined with clinical observations, patient history, and epidemiological information. The expected result is Negative.  Fact Sheet for Patients: SugarRoll.be  Fact Sheet for Healthcare Providers: https://www.woods-mathews.com/  This test is not yet approved or cleared by the [place] FDA and  has been authorized for detection and/or diagnosis of SARS-CoV-2 by FDA under an Emergency Use Authorization (EUA). This EUA will remain  in effect (meaning this test can be used) for the duration of the COVID-19 declaration under Se ction 564(b)(1) of the Act, 21 U.S.C. section 360bbb-3(b)(1), unless the authorization is terminated or revoked sooner.  Performed at [facility], [place] [address], [place], [place] [address]   MRSA PCR  Screening     Status: None   Collection Time: 06/04/20  2:09 PM   Specimen: Nasal Mucosa; Nasopharyngeal  Result Value Ref Range Status   MRSA by PCR NEGATIVE NEGATIVE Final    Comment:        The GeneXpert MRSA Assay (FDA approved for NASAL specimens only), is one component of a comprehensive MRSA colonization surveillance program. It is not intended to diagnose MRSA infection nor to guide or monitor treatment for MRSA infections. Performed at [facility], [address], [place], [place] [address]   Culture, body fluid w Gram Stain-bottle     Status: None (Preliminary result)   Collection Time: 06/06/20 11:15 AM   Specimen: Peritoneal Washings  Result Value Ref Range Status   Specimen Description PERITONEAL  Final   Special Requests BOTTLES DRAWN AEROBIC AND ANAEROBIC 10CC  Final   Culture   Final    NO GROWTH 2 DAYS Performed at [facility], [address], [place], [place] [address]    Report Status PENDING  Incomplete  Gram stain     Status: None   Collection Time: 06/06/20 11:15 AM   Specimen: Peritoneal Washings  Result Value Ref Range Status   Specimen Description PERITONEAL  Final   Special Requests NONE  Final   Gram Stain   Final    CYTOSPIN SMEAR NO ORGANISMS SEEN WBC PRESENT, PREDOMINANTLY MONONUCLEAR Performed at [facility], [address] [place],  [address]    Report Status 06/06/2020 FINAL  Final     Radiology Studies: No results found.  Scheduled Meds: . Chlorhexidine Gluconate Cloth  6 each Topical Daily  . FLUoxetine  80 mg Oral Daily  . folic acid  1 mg Oral Daily  . lactulose  20 g Oral TID  . lactulose  300 mL Rectal BID  . lamoTRIgine  150 mg Oral BID  . levETIRAcetam  1,000 mg Oral BID  . pantoprazole  40 mg Oral BID  . rifaximin  550 mg Oral BID   Continuous Infusions: .  cefTRIAXone (ROCEPHIN)  IV 2 g (06/08/20 1130)  . lactated ringers 100 mL/hr at 06/08/20 0146     LOS: 5 days    Time spent: 35 minutes    Barton Dubois, MD [facility]  If 7PM-7AM, please contact night-coverage [email or web address] 06/08/2020, 12:32 PM

## 2020-06-09 ENCOUNTER — Inpatient Hospital Stay (HOSPITAL_COMMUNITY): Payer: Medicare HMO

## 2020-06-09 DIAGNOSIS — K7200 Acute and subacute hepatic failure without coma: Secondary | ICD-10-CM | POA: Diagnosis not present

## 2020-06-09 DIAGNOSIS — Z515 Encounter for palliative care: Secondary | ICD-10-CM | POA: Diagnosis not present

## 2020-06-09 DIAGNOSIS — Z7189 Other specified counseling: Secondary | ICD-10-CM | POA: Diagnosis not present

## 2020-06-09 LAB — BLOOD GAS, ARTERIAL
Acid-base deficit: 2.7 mmol/L — ABNORMAL HIGH (ref 0.0–2.0)
Bicarbonate: 22.4 mmol/L (ref 20.0–28.0)
FIO2: 100
O2 Saturation: 95.6 %
Patient temperature: 37
pCO2 arterial: 31.7 mmHg — ABNORMAL LOW (ref 32.0–48.0)
pH, Arterial: 7.435 (ref 7.350–7.450)
pO2, Arterial: 86.1 mmHg (ref 83.0–108.0)

## 2020-06-09 LAB — GLUCOSE, CAPILLARY: Glucose-Capillary: 135 mg/dL — ABNORMAL HIGH (ref 70–99)

## 2020-06-09 MED ORDER — LORAZEPAM 2 MG/ML IJ SOLN
INTRAMUSCULAR | Status: AC
  Administered 2020-06-09: 2 mg via INTRAVENOUS
  Filled 2020-06-09: qty 1

## 2020-06-09 MED ORDER — LACTULOSE ENEMA
300.0000 mL | Freq: Three times a day (TID) | ORAL | Status: DC
  Administered 2020-06-09 (×2): 300 mL via RECTAL
  Filled 2020-06-09 (×6): qty 300

## 2020-06-09 MED ORDER — METOPROLOL TARTRATE 5 MG/5ML IV SOLN
2.5000 mg | Freq: Once | INTRAVENOUS | Status: AC
  Administered 2020-06-09: 2.5 mg via INTRAVENOUS
  Filled 2020-06-09: qty 5

## 2020-06-09 MED ORDER — LEVETIRACETAM IN NACL 1000 MG/100ML IV SOLN
1000.0000 mg | Freq: Once | INTRAVENOUS | Status: AC
  Administered 2020-06-09: 1000 mg via INTRAVENOUS
  Filled 2020-06-09: qty 100

## 2020-06-09 MED ORDER — LEVETIRACETAM IN NACL 1000 MG/100ML IV SOLN: 1000.0000 mg | Freq: Two times a day (BID) | INTRAVENOUS | Status: DC

## 2020-06-09 MED ORDER — LORAZEPAM 2 MG/ML IJ SOLN
INTRAMUSCULAR | Status: AC
  Filled 2020-06-09: qty 1

## 2020-06-09 MED ORDER — LORAZEPAM 2 MG/ML IJ SOLN: 2.0000 mg | Freq: Once | INTRAMUSCULAR | Status: AC

## 2020-06-09 NOTE — Progress Notes (Signed)
PROGRESS NOTE    [name]  XBM:[id number] DOB: 02/12/1965 DOA: 05/14/2020 PCP: Caprice Renshaw, MD   Brief Narrative:   Monica Pavone Sheltonis a 55 y.o.femalewith medical history significant forCOPD, meningioma, cardiac arrest December 2020, hepatic steatosis,PNES, cognitive impairment, depression,andcomplex partial seizurewho presented to the ED after being found down at her skilled facility.  She was admitted with acute hepatic encephalopathy.  5/23 she did develop some hematemesis and has undergone EGD on 5/24 with findings of some mild esophagitis as well as gastritis.  She has been restarted on a diet with oral PPI.  Paracentesis ordered for today.  Family would like to take patient home with home health and outpatient palliative care once stable for discharge.  Assessment & Plan:   Active Problems:   Cirrhosis ([place])   Acute hepatic encephalopathy   UGI bleed   Acute hepatic encephalopathy -Continue on lactulose and monitor ammonia levels which are fluctuating (elevated today). -Uncertain if she has been taking her medications or not versus need for adjustment. -Continue supportive care. -Per records and based on patient inability to take medications orally at times she is receiving less rifaximin and lactulose, of what has been ordered per GI recommendations. -started on lactulose enema; will follow ammonia level; last checked 110 and currently more obtunded.   Hematemesis-resolved -Status post EGD 5/24 with findings of esophagitis and gastritis, but no active bleeding -Octreotide and protonix infusions discontinued Oral PPI BID -Monitor H/H and transfuse for H<7 -Soft diet restarted  AKI -resolved -Likely prerenal -continue avoiding nephrotoxic agents and monitor intake and output -continue to follow renal function intermittently -encouraged adequate hydration   Mild hypernatremia -will continue D5W maintenance IVF's.  Lactic acidosis -stable -Likely  related to dehydration and poor oral intake -Continue to monitor -maintaining adequate hydration is difficult with lack of proper oral intake.  MSSA bacteremia -was on Ancef through 5/29 to finish treatment; per GI rec's started on Rocephin which will cover SBP and MSSA. -no fever currently.  Urinary retention -Foley catheter in place from prior admission -No signs of UTI currently noted -Plan to attempt voiding trial once more awake and alert  Chronic pain -Continue holding analgesics current regimen until more alert and awake  Liver cirrhosis -Some ascites for which paracentesis ordered -Elevated ammonia levels as noted below, despite lactulose; concerns or not receiving enough given intermittent episode of being obtunded and unable to take oral medication. -ammonia 110 today; -Will initiate lactulose enema. -Started on Rocephin per GI for SBP prophylaxis -No signs of SBP on recent paracentesis. -overall with poor prognosis -GI recommending palliative/hospice at discharge.  Anemia of chronic disease -stable currently. -Transfuse for hemoglobin less than 7 -No overt bleeding noted currently -Continue to follow hemoglobin trend.  Recurrent gait instability with frequent falls/dizziness -CT head with no acute findings -Patient to go home with home health  Complex partial seizures/PNES -Continue home Keppra and Lamictal -No seizure activity appreciated at this time.  Depression -Resume fluoxetine and lorazepam  Meningioma -Follows with Dr. [name] outpatient  Transaminitis -Appears to be a persistent problem in the setting of liver cirrhosis/NAFLD with hepatic congestion -Continue to monitor  Restless leg -Continue holding Mirapex due to prior confusion  DVT prophylaxis:  SCDs Code Status: DNR Family Communication:  No family at bedside; unable to reach mother over the phone.  Disposition Plan:  Status is: Inpatient  Remains inpatient appropriate  because:Ongoing diagnostic testing needed not appropriate for outpatient work up, IV treatments appropriate due to intensity of illness or  inability to take PO and Inpatient level of care appropriate due to severity of illness   Dispo: The patient is from: SNF  Anticipated d/c is to: Home with home health and outpt palliative  Patient currently is not medically stable to d/c yet.              Difficult to place patient No  Skin Assessment:  I have examined the patient's skin and I agree with the wound assessment as performed by the wound care RN as outlined below:  Pressure Injury 05/25/20 Sacrum Mid Stage 1 -  Intact skin with non-blanchable redness of a localized area usually over a bony prominence. (Active)  05/25/20 1746  Location: Sacrum  Location Orientation: Mid  Staging: Stage 1 -  Intact skin with non-blanchable redness of a localized area usually over a bony prominence.  Wound Description (Comments):   Present on Admission: No    Consultants:   GI  Palliative  Procedures:   See below  Antimicrobials:  Anti-infectives (From admission, onward)   Start     Dose/Rate Route Frequency Ordered Stop   06/07/20 1000  cefTRIAXone (ROCEPHIN) 2 g in sodium chloride 0.9 % 100 mL IVPB        2 g 200 mL/hr over 30 Minutes Intravenous Daily 06/06/20 1315 06/11/20 0959   06/06/20 1415  cefTRIAXone (ROCEPHIN) 1 g in sodium chloride 0.9 % 100 mL IVPB        1 g 200 mL/hr over 30 Minutes Intravenous  Once 06/06/20 1315 06/06/20 1402   05/27/2020 2200  rifaximin (XIFAXAN) tablet 550 mg        550 mg Oral 2 times daily 05/31/2020 1524     06/04/20 1330  cefTRIAXone (ROCEPHIN) 1 g in sodium chloride 0.9 % 100 mL IVPB  Status:  Discontinued        1 g 200 mL/hr over 30 Minutes Intravenous Daily 06/04/20 1320 06/06/20 1315   05/15/2020 1400  ceFAZolin (ANCEF) IVPB 2g/100 mL premix  Status:  Discontinued       Note to Pharmacy: Indication:  MSSA bacteremia   First Dose: Yes Last Day of Therapy:  Jun 10, 2020 Labs - Once weekly:  CBC/D and BMP, Labs - Every other week:  ESR and CRP Method of administration: IV Push Method of administration may be changed at the discretion of home infusion pharmacist based upon assessme   2 g 200 mL/hr over 30 Minutes Intravenous Every 8 hours 05/17/2020 1044 06/06/20 1315      Subjective: Remains lethargic and intermittently obtunded.  Currently not following commands.  No fever, no chest pain, no nausea or vomiting reported.  Objective: Vitals:   06/08/20 1400 06/08/20 1500 06/08/20 2218 06/09/20 0540  BP: 127/70  125/82 (!) 134/95  Pulse: (!) 104 91 (!) 103 (!) 106  Resp: (!) '29 20 20 20  ' Temp: 98.3 F (36.8 C)  98.6 F (37 C) (!) 97.5 F (36.4 C)  TempSrc: Axillary  Oral Oral  SpO2:   95% 94%  Weight:      Height:        Intake/Output Summary (Last 24 hours) at 06/09/2020 1419 Last data filed at 06/09/2020 0853 Gross per 24 hour  Intake 288.85 ml  Output 1200 ml  Net -911.15 ml   Filed Weights   06/01/2020 0544  Weight: 63 kg    Examination: General exam: No fever, no shortness of breath, chest pain nausea or vomiting reported.  Patient continued to be obtunded  and intermittently only minimally responsive.  Respiratory system: Good air movement bilaterally; no wheezing or crackles. Cardiovascular system:RRR. No murmurs, rubs, gallops.  No JVD. Gastrointestinal system: Abdomen is mildly distended;, soft, no guarding, positive bowel sounds.  Central nervous system: Moving 4 limbs spontaneously; was not able to follow commands for my examination.  Remains intermittently obtunded. Extremities: No cyanosis or clubbing. Skin: No petechiae. Psychiatry: Unable to properly assess in the setting of current mentation.   Data Reviewed: I have personally reviewed following labs and imaging studies  CBC: Recent Labs  Lab 05/27/2020 0605 06/04/20 0533 06/04/20 1532 06/04/20 2049 05/30/2020 0143  05/25/2020 0507 06/06/20 0455 06/08/20 1047  WBC 8.7 9.1  --   --   --  9.0 12.2* 14.6*  NEUTROABS 5.8  --   --   --   --   --   --   --   HGB 8.9* 9.0*   < > 8.9* 8.2* 8.0* 8.3* 8.1*  HCT 27.4* 27.6*   < > 28.2* 25.5* 24.9* 26.1* 25.5*  MCV 96.1 96.5  --   --   --  98.8 98.9 99.6  PLT 214 213  --   --   --  178 207 162   < > = values in this interval not displayed.   Basic Metabolic Panel: Recent Labs  Lab 05/21/2020 0605 06/04/20 0533 06/04/2020 0507 06/06/20 0455 06/08/20 1047  NA 145 147* 145 148* 147*  K 3.5 3.4* 3.5 3.5 3.4*  CL 113* 114* 116* 117* 119*  CO2 '23 25 22 23 22  ' GLUCOSE 106* 102* 115* 109* 109*  BUN 39* 29* 23* 19 16  CREATININE 1.39* 1.01* 0.85 0.79 0.75  CALCIUM 7.7* 7.8* 7.3* 7.8* 7.6*  MG  --  2.2 2.0 2.1  --    GFR: Estimated Creatinine Clearance: 66.6 mL/min (by C-G formula based on SCr of 0.75 mg/dL).   Liver Function Tests: Recent Labs  Lab 06/04/2020 0605 06/04/20 0533 06/10/2020 0507 06/06/20 0455 06/08/20 1047  AST 196* 180* 175* 194* 147*  ALT 46* 39 31 30 33  ALKPHOS 160* 155* 146* 148* 146*  BILITOT 2.5* 2.6* 2.7* 3.3* 3.6*  PROT 4.9* 5.2* 4.8* 5.0* 4.8*  ALBUMIN 2.1* 2.3* 2.0* 2.3* 2.1*   Recent Labs  Lab 05/27/2020 0605  LIPASE 111*   Recent Labs  Lab 06/09/2020 0702 06/04/20 0533 06/02/2020 0555 06/06/20 0455 06/08/20 0529  AMMONIA 72* 55* 77* 97* 110*   Coagulation Profile: Recent Labs  Lab 06/04/20 1403 06/09/2020 0748 06/08/20 1047  INR 2.3* 1.5* 1.5*   Sepsis Labs: Recent Labs  Lab 06/11/2020 0702 06/04/2020 0815 06/04/20 0533  LATICACIDVEN 1.6 2.0* 1.9    Recent Results (from the past 240 hour(s))  Culture, blood (routine x 2)     Status: None   Collection Time: 06/10/2020  8:12 AM   Specimen: Cath Site; Blood  Result Value Ref Range Status   Specimen Description CATH SITE BOTTLES DRAWN AEROBIC AND ANAEROBIC  Final   Special Requests Blood Culture adequate volume  Final   Culture   Final    NO GROWTH 5  DAYS Performed at [facility], [address], [place],  [address]    Report Status 06/08/2020 FINAL  Final  Culture, blood (routine x 2)     Status: None   Collection Time: 06/02/2020  8:15 AM   Specimen: Left Antecubital; Blood  Result Value Ref Range Status   Specimen Description   Final    LEFT  ANTECUBITAL BOTTLES DRAWN AEROBIC AND ANAEROBIC   Special Requests Blood Culture adequate volume  Final   Culture   Final    NO GROWTH 5 DAYS Performed at [facility], [address], [place], [place] [address]    Report Status 06/08/2020 FINAL  Final  SARS CORONAVIRUS 2 (TAT 6-24 HRS) Nasopharyngeal Nasopharyngeal Swab     Status: None   Collection Time: 05/18/2020  8:48 AM   Specimen: Nasopharyngeal Swab  Result Value Ref Range Status   SARS Coronavirus 2 NEGATIVE NEGATIVE Final    Comment: (NOTE) SARS-CoV-2 target nucleic acids are NOT DETECTED.  The SARS-CoV-2 RNA is generally detectable in upper and lower respiratory specimens during the acute phase of infection. Negative results do not preclude SARS-CoV-2 infection, do not rule out co-infections with other pathogens, and should not be used as the sole basis for treatment or other patient management decisions. Negative results must be combined with clinical observations, patient history, and epidemiological information. The expected result is Negative.  Fact Sheet for Patients: SugarRoll.be  Fact Sheet for Healthcare Providers: https://www.woods-mathews.com/  This test is not yet approved or cleared by the [place] FDA and  has been authorized for detection and/or diagnosis of SARS-CoV-2 by FDA under an Emergency Use Authorization (EUA). This EUA will remain  in effect (meaning this test can be used) for the duration of the COVID-19 declaration under Se ction 564(b)(1) of the Act, 21 U.S.C. section 360bbb-3(b)(1), unless the authorization is terminated or revoked  sooner.  Performed at [facility], [place] [address] [place], [place] [address]   MRSA PCR Screening     Status: None   Collection Time: 06/04/20  2:09 PM   Specimen: Nasal Mucosa; Nasopharyngeal  Result Value Ref Range Status   MRSA by PCR NEGATIVE NEGATIVE Final    Comment:        The GeneXpert MRSA Assay (FDA approved for NASAL specimens only), is one component of a comprehensive MRSA colonization surveillance program. It is not intended to diagnose MRSA infection nor to guide or monitor treatment for MRSA infections. Performed at [facility], [address] [place], [place] [address]   Culture, body fluid w Gram Stain-bottle     Status: None (Preliminary result)   Collection Time: 06/06/20 11:15 AM   Specimen: Peritoneal Washings  Result Value Ref Range Status   Specimen Description PERITONEAL  Final   Special Requests BOTTLES DRAWN AEROBIC AND ANAEROBIC 10CC  Final   Culture   Final    NO GROWTH 3 DAYS Performed at [facility], [address] [place], [place] [address]    Report Status PENDING  Incomplete  Gram stain     Status: None   Collection Time: 06/06/20 11:15 AM   Specimen: Peritoneal Washings  Result Value Ref Range Status   Specimen Description PERITONEAL  Final   Special Requests NONE  Final   Gram Stain   Final    CYTOSPIN SMEAR NO ORGANISMS SEEN WBC PRESENT, PREDOMINANTLY MONONUCLEAR Performed at [facility], [address] [place], [place] [address]    Report Status 06/06/2020 FINAL  Final     Radiology Studies: No results found.  Scheduled Meds: . Chlorhexidine Gluconate Cloth  6 each Topical Daily  . FLUoxetine  80 mg Oral Daily  . folic acid  1 mg Oral Daily  . furosemide  40 mg Intravenous Daily  . lactulose  20 g Oral TID  . lamoTRIgine  150 mg Oral BID  . levETIRAcetam  1,000 mg Oral BID  . pantoprazole  40 mg Oral BID  . rifaximin  550 mg Oral BID   Continuous Infusions: . cefTRIAXone (ROCEPHIN)  IV 2 g (06/09/20 0955)   . dextrose 5 % with KCl 20 mEq / L 20 mEq (06/08/20 1319)  . lactated ringers 100 mL/hr at 06/08/20 1314     LOS: 6 days    Time spent: 30 minutes    Barton Dubois, MD [facility]  If 7PM-7AM, please contact night-coverage [email or web address] 06/09/2020, 2:19 PM

## 2020-06-09 NOTE — Progress Notes (Signed)
Pt's vitals at 1818 temp 99 /BP 118/74 /PULSE 110 / RESP 20 / O2 95 on RA WAS A GREEN MEWS : MOST RECENT VITAL @1959  TEMP 99.8 BP 142/91(105) PULSE 120 RESP 24 O2 93 on RA.. MEWS NOW YELLOW.. pt has refused all meds and has a flexi seal in place.Awaiting orders from on call Dr. Earnest Conroy

## 2020-06-09 NOTE — Progress Notes (Signed)
Rapid response called.  Patient seen and examined at bedside with Dr. Sabra Heck as well.  Patient demonstrating somewhat labored breathing, with upper airway noise.  Patient is minimally responsive, with moans to painful stimuli.  Determined to be having a seizure.  Patient has missed Keppra doses recently, because she is refusing medications.  Patient is currently DNR and plan is to make comfort at discharge.  Patient loaded with 1 g of Keppra, 2 mg of Ativan.  EKG is without acute changes.  Patient currently on nonrebreather and maintaining oxygen sats mid high 90s.  Advised nursing staff that patient is not going to be very responsive in the postictal state, but wean off oxygen as tolerated.  Stat chest x-ray ordered for possible rales in the bases.  ABG also ordered.  Radiology and respiratory at bedside during rapid response.  Glucose was stable at 135.  Slightly tachycardic, blood pressure stable.  Patient did receive lactulose tonight through the Flexi-Seal.  Changed twice daily dosing of Keppra to IV until discharge.  Adding seizure precautions neurochecks.

## 2020-06-09 NOTE — Progress Notes (Signed)
Rapid Response called.@2225  1 gram of keppa, 2 mg ativan @2230  ADMINISTERED.  chest xray , 95%NRB BP 120/80 HR 119  cbg 135   CURRENT 2243 124/90(101) PULSE 108 O2 100% ON NRB @15L  Rapid response and MD and ER MD came to bed side to assess pt. CURRENT vs @2307  133/90 HR 114 O2 85 on 7L high flow [place] Will continue to monitor.

## 2020-06-09 NOTE — ED Provider Notes (Signed)
[facility]  Department of Emergency Medicine   Code Blue CONSULT NOTE  Chief Complaint: Cardiac arrest/unresponsive   Level V Caveat: Unresponsive  History of present illness: I was contacted by the hospital for a rapid response upstairs and presented to the patient's bedside to find the patient to be actively seizing and myotonic activity, breathing, nonrebreather, clenched jaw.  ROS: Unable to obtain, Level V caveat  Scheduled Meds: . Chlorhexidine Gluconate Cloth  6 each Topical Daily  . FLUoxetine  80 mg Oral Daily  . folic acid  1 mg Oral Daily  . furosemide  40 mg Intravenous Daily  . lactulose  20 g Oral TID  . lactulose  300 mL Rectal TID  . lamoTRIgine  150 mg Oral BID  . levETIRAcetam  1,000 mg Oral BID  . LORazepam      . pantoprazole  40 mg Oral BID  . rifaximin  550 mg Oral BID   Continuous Infusions: . cefTRIAXone (ROCEPHIN)  IV 2 g (06/09/20 0955)  . dextrose 5 % with KCl 20 mEq / L 20 mEq (06/09/20 1516)  . levETIRAcetam     PRN Meds:.acetaminophen, food thickener, LORazepam, LORazepam, ondansetron **OR** ondansetron (ZOFRAN) IV, sodium phosphate Past Medical History:  Diagnosis Date  . Arthritis    "knees" (04/22/2012)  . Brain tumor (benign) ([place])   . Chronic bronchitis ([place])    "yearly; when the weather changes" (04/22/2012)  . Colon polyp   . Colon polyps    adenomatous and hyperplastic-  . Depression   . Eczema   . Epilepsy ([place])    "been having them right often here lately" (04/22/2012)  . Fatty liver   . High cholesterol   . History of kidney stones   . Osteoarthritis    [name] 04/22/2012  . Other convulsions 05/21/12   non-epileptic spells  . Restless leg   . Seizures ([place])   . Vertigo    Past Surgical History:  Procedure Laterality Date  . ABDOMINAL HYSTERECTOMY  2001  . BIOPSY  05/20/2020   Procedure: BIOPSY;  Surgeon: Eloise Harman, DO;  [facility];  Service: Endoscopy;;  . BLADDER SUSPENSION    .  BUNIONECTOMY Left 2000  . CESAREAN SECTION  1987; 1988  . COLONOSCOPY    . ESOPHAGOGASTRODUODENOSCOPY (EGD) WITH PROPOFOL N/A 05/28/2020   Procedure: ESOPHAGOGASTRODUODENOSCOPY (EGD) WITH PROPOFOL;  Surgeon: Eloise Harman, DO;  [facility];  Service: Endoscopy;  Laterality: N/A;  . JOINT REPLACEMENT    . MASS EXCISION  10/22/2011   Procedure: EXCISION MASS;  Surgeon: [name], MD;  Location: [facility];  Service: General;  Laterality: Right;  excision right buttock mass  . TEE WITHOUT CARDIOVERSION N/A 05/16/2020   Procedure: TRANSESOPHAGEAL ECHOCARDIOGRAM (TEE) WITH PROPOFOL;  Surgeon: Arnoldo Lenis, MD;  Location: [facility];  Service: Endoscopy;  Laterality: N/A;  . TOTAL HIP ARTHROPLASTY Left 1993; 1995; 2000  . TOTAL KNEE ARTHROPLASTY Left 05/07/2015   Procedure: TOTAL LEFT KNEE ARTHROPLASTY;  Surgeon: [name], MD;  Location: [facility];  Service: Orthopedics;  Laterality: Left;  . TOTAL KNEE ARTHROPLASTY Right 10/01/2015   Procedure: RIGHT TOTAL KNEE ARTHROPLASTY;  Surgeon: [name], MD;  Location: [facility];  Service: Orthopedics;  Laterality: Right;   Social History   Socioeconomic History  . Marital status: Widowed    Spouse name: [name]  . Number of children: 2  . Years of education: 12 +  . Highest education level: Not  on file  Occupational History  . Occupation: disability, medical  Tobacco Use  . Smoking status: Never Smoker  . Smokeless tobacco: Never Used  Vaping Use  . Vaping Use: Never used  Substance and Sexual Activity  . Alcohol use: No    Alcohol/week: 0.0 standard drinks    Comment: 12-25-2015 per pt no  . Drug use: No    Comment: 21-12-2015 per pt no   . Sexual activity: Not Currently    Birth control/protection: Surgical  Other Topics Concern  . Not on file  Social History Narrative   Patient has two adult children.   Patient is disabled.   Patient has a high school education and some trade school.   Patient is right-handed.    Patient drinks four glasses of soda and tea daily.   Social Determinants of Health   Financial Resource Strain: Not on file  Food Insecurity: No Food Insecurity  . Worried About Charity fundraiser in the Last Year: Never true  . Ran Out of Food in the Last Year: Never true  Transportation Needs: No Transportation Needs  . Lack of Transportation (Medical): No  . Lack of Transportation (Non-Medical): No  Physical Activity: Not on file  Stress: Not on file  Social Connections: Not on file  Intimate Partner Violence: Not on file   Allergies  Allergen Reactions  . Acetaminophen Other (See Comments)    Has fatty deposits on liver  . Benadryl [Diphenhydramine Hcl] Other (See Comments)    hyperactivity and seizures  . Codeine Itching and Rash    seizures  . Dilantin [Phenytoin Sodium Extended] Other (See Comments)    Elevated LFT's  . Melatonin Other (See Comments)    seizures  . Tramadol Other (See Comments)    "causes seizures"  . Ultram [Tramadol Hcl] Other (See Comments)    Seizures  . Vimpat [Lacosamide] Other (See Comments)    Severe dizziness  . Mirtazapine Other (See Comments)    Potentiated seizure activity  . Betadine [Povidone Iodine] Rash  . Latex Rash  . Penicillins Itching and Rash    Patient can tolerate cephalosporins  . Sulfa Antibiotics Rash  . Tape Rash and Other (See Comments)    Paper tape please Paper tape please  . Vicodin [Hydrocodone-Acetaminophen] Itching    Last set of Vital Signs (not current) Vitals:   06/09/20 2101 06/09/20 2155  BP: 120/83 (!) 136/104  Pulse: (!) 119 (!) 120  Resp: (!) 23 (!) 24  Temp: 99.7 F (37.6 C) 98.8 F (37.1 C)  SpO2: 92% 92%      Physical Exam Ill-appearing Gen: unresponsive, actively seizing Cardiovascular: Good pulses radial arteries Resp: apneic. Breath sounds equal bilaterally and spontaneous, rales in the left lung field Abd: nondistended  Neuro: GCS 3, unresponsive to pain  HEENT: Jaw clenched,  unable to see the posterior pharynx Neck: No crepitus  Musculoskeletal: No deformity  Skin: warm  Procedures  Airway maintenance: The patient was receiving a nonrebreather, the patient is DO NOT RESUSCITATE as I was informed upon arrival to the room after questioning the nurse about the patient's wishes and advanced directives     Medical Decision making  The patient has DO NOT RESUSCITATE orders, she is actively seizing, I ordered 2 mg of Ativan  Assessment and Plan  The patient has received Ativan, the hospitalist is at the bedside to take over care, definitive airway is not indicated given the patient's DO NOT RESUSCITATE status.  I did  ask for Keppra Ativan and cardiac monitoring, blood sugar was 130 or thereabouts.  Dr. Clearence Ped - assuming care upon arrival to the bedside.    Noemi Chapel, MD 06/09/20 2238

## 2020-06-10 DIAGNOSIS — Z7189 Other specified counseling: Secondary | ICD-10-CM | POA: Diagnosis not present

## 2020-06-10 DIAGNOSIS — Z515 Encounter for palliative care: Secondary | ICD-10-CM | POA: Diagnosis not present

## 2020-06-10 DIAGNOSIS — K7200 Acute and subacute hepatic failure without coma: Secondary | ICD-10-CM | POA: Diagnosis not present

## 2020-06-10 MED ORDER — HALOPERIDOL LACTATE 2 MG/ML PO CONC: 0.5000 mg | ORAL | Status: DC | PRN

## 2020-06-10 MED ORDER — SODIUM CHLORIDE 0.9% FLUSH: 3.0000 mL | INTRAVENOUS | Status: DC | PRN

## 2020-06-10 MED ORDER — DIPHENHYDRAMINE HCL 50 MG/ML IJ SOLN: 12.5000 mg | Freq: Once | INTRAMUSCULAR | Status: DC

## 2020-06-10 MED ORDER — POLYVINYL ALCOHOL 1.4 % OP SOLN: 1.0000 [drp] | Freq: Four times a day (QID) | OPHTHALMIC | Status: DC | PRN

## 2020-06-10 MED ORDER — FENTANYL CITRATE (PF) 100 MCG/2ML IJ SOLN
12.5000 ug | INTRAMUSCULAR | Status: DC | PRN
  Filled 2020-06-10: qty 2

## 2020-06-10 MED ORDER — METHYLPREDNISOLONE SODIUM SUCC 40 MG IJ SOLR
20.0000 mg | Freq: Once | INTRAMUSCULAR | Status: AC
  Administered 2020-06-10: 20 mg via INTRAVENOUS
  Filled 2020-06-10: qty 1

## 2020-06-10 MED ORDER — ACETAMINOPHEN 650 MG RE SUPP
650.0000 mg | Freq: Four times a day (QID) | RECTAL | Status: DC | PRN
  Administered 2020-06-10: 650 mg via RECTAL
  Filled 2020-06-10: qty 1

## 2020-06-10 MED ORDER — ONDANSETRON 4 MG PO TBDP: 4.0000 mg | ORAL_TABLET | Freq: Four times a day (QID) | ORAL | Status: DC | PRN

## 2020-06-10 MED ORDER — FENTANYL CITRATE (PF) 100 MCG/2ML IJ SOLN: 50.0000 ug | INTRAMUSCULAR | Status: DC | PRN

## 2020-06-10 MED ORDER — SODIUM CHLORIDE 0.9 % IV SOLN: 250.0000 mL | INTRAVENOUS | Status: DC | PRN

## 2020-06-10 MED ORDER — MIDAZOLAM 50MG/50ML (1MG/ML) PREMIX INFUSION
0.5000 mg/h | INTRAVENOUS | Status: DC
Start: 2020-06-10 — End: 2020-06-12
  Administered 2020-06-10: 2 mg/h via INTRAVENOUS
  Administered 2020-06-11: 5 mg/h via INTRAVENOUS
  Administered 2020-06-11: 4 mg/h via INTRAVENOUS
  Administered 2020-06-11: 7 mg/h via INTRAVENOUS
  Filled 2020-06-10 (×5): qty 50

## 2020-06-10 MED ORDER — HYDROMORPHONE HCL PF 10 MG/ML IJ SOLN
INTRAMUSCULAR | Status: AC
  Filled 2020-06-10: qty 5

## 2020-06-10 MED ORDER — MORPHINE SULFATE (PF) 4 MG/ML IV SOLN
4.0000 mg | Freq: Once | INTRAVENOUS | Status: AC
  Administered 2020-06-10: 4 mg via INTRAVENOUS
  Filled 2020-06-10: qty 1

## 2020-06-10 MED ORDER — LORAZEPAM 1 MG PO TABS: 1.0000 mg | ORAL_TABLET | ORAL | Status: DC | PRN

## 2020-06-10 MED ORDER — LORAZEPAM 2 MG/ML IJ SOLN
1.0000 mg | INTRAMUSCULAR | Status: DC | PRN
  Administered 2020-06-11 (×2): 1 mg via INTRAVENOUS
  Filled 2020-06-10 (×2): qty 1

## 2020-06-10 MED ORDER — SODIUM CHLORIDE 0.9 % IV SOLN
3.0000 mg/h | INTRAVENOUS | Status: DC
  Administered 2020-06-10: 1 mg/h via INTRAVENOUS
  Administered 2020-06-10 – 2020-06-11 (×2): 3 mg/h via INTRAVENOUS
  Filled 2020-06-10 (×2): qty 5

## 2020-06-10 MED ORDER — BUDESONIDE 0.5 MG/2ML IN SUSP
0.5000 mg | Freq: Two times a day (BID) | RESPIRATORY_TRACT | Status: DC
  Administered 2020-06-10: 0.5 mg via RESPIRATORY_TRACT
  Filled 2020-06-10 (×2): qty 2

## 2020-06-10 MED ORDER — ACETAMINOPHEN 325 MG PO TABS: 650.0000 mg | ORAL_TABLET | Freq: Four times a day (QID) | ORAL | Status: DC | PRN

## 2020-06-10 MED ORDER — HYDROMORPHONE BOLUS VIA INFUSION
0.5000 mg | INTRAVENOUS | Status: DC | PRN
  Administered 2020-06-10: 0.5 mg via INTRAVENOUS
  Filled 2020-06-10: qty 1

## 2020-06-10 MED ORDER — SCOPOLAMINE 1 MG/3DAYS TD PT72
1.0000 | MEDICATED_PATCH | TRANSDERMAL | Status: DC
  Administered 2020-06-10: 1.5 mg via TRANSDERMAL
  Filled 2020-06-10 (×2): qty 1

## 2020-06-10 MED ORDER — LEVETIRACETAM IN NACL 500 MG/100ML IV SOLN: 500.0000 mg | Freq: Two times a day (BID) | INTRAVENOUS | Status: DC

## 2020-06-10 MED ORDER — GLYCOPYRROLATE 1 MG PO TABS: 1.0000 mg | ORAL_TABLET | ORAL | Status: DC | PRN

## 2020-06-10 MED ORDER — HYDROMORPHONE BOLUS VIA INFUSION
2.0000 mg | INTRAVENOUS | Status: DC | PRN
Start: 2020-06-10 — End: 2020-06-12
  Administered 2020-06-10 – 2020-06-11 (×5): 2 mg via INTRAVENOUS
  Filled 2020-06-10: qty 2

## 2020-06-10 MED ORDER — LORAZEPAM 2 MG/ML PO CONC: 1.0000 mg | ORAL | Status: DC | PRN

## 2020-06-10 MED ORDER — GLYCOPYRROLATE 0.2 MG/ML IJ SOLN
0.2000 mg | INTRAMUSCULAR | Status: DC | PRN
  Administered 2020-06-10 – 2020-06-11 (×3): 0.2 mg via INTRAVENOUS
  Filled 2020-06-10 (×3): qty 1

## 2020-06-10 MED ORDER — HALOPERIDOL LACTATE 5 MG/ML IJ SOLN
0.5000 mg | INTRAMUSCULAR | Status: DC | PRN
  Administered 2020-06-11 (×2): 0.5 mg via INTRAVENOUS
  Filled 2020-06-10 (×2): qty 1

## 2020-06-10 MED ORDER — LORAZEPAM 2 MG/ML IJ SOLN
1.0000 mg | INTRAMUSCULAR | Status: DC | PRN
  Administered 2020-06-10 (×5): 1 mg via INTRAVENOUS
  Filled 2020-06-10 (×5): qty 1

## 2020-06-10 MED ORDER — GLYCOPYRROLATE 0.2 MG/ML IJ SOLN: 0.2000 mg | INTRAMUSCULAR | Status: DC | PRN

## 2020-06-10 MED ORDER — GLYCOPYRROLATE 0.2 MG/ML IJ SOLN
0.1000 mg | Freq: Once | INTRAMUSCULAR | Status: AC
  Administered 2020-06-10: 0.1 mg via INTRAVENOUS
  Filled 2020-06-10: qty 1

## 2020-06-10 MED ORDER — FENTANYL 2500MCG IN NS 250ML (10MCG/ML) PREMIX INFUSION
0.0000 ug/h | INTRAVENOUS | Status: DC
  Administered 2020-06-10: 25 ug/h via INTRAVENOUS
  Administered 2020-06-10: 75 ug/h via INTRAVENOUS
  Filled 2020-06-10: qty 250

## 2020-06-10 MED ORDER — ONDANSETRON HCL 4 MG/2ML IJ SOLN: 4.0000 mg | Freq: Four times a day (QID) | INTRAMUSCULAR | Status: DC | PRN

## 2020-06-10 MED ORDER — LORAZEPAM 2 MG/ML IJ SOLN
1.0000 mg | INTRAMUSCULAR | Status: DC | PRN
  Administered 2020-06-10: 1 mg via INTRAVENOUS
  Filled 2020-06-10: qty 1

## 2020-06-10 MED ORDER — SODIUM CHLORIDE 0.9 % IV SOLN
12.5000 mg | Freq: Four times a day (QID) | INTRAVENOUS | Status: DC | PRN
  Filled 2020-06-10: qty 0.5

## 2020-06-10 MED ORDER — HYDROMORPHONE BOLUS VIA INFUSION
1.0000 mg | INTRAVENOUS | Status: DC | PRN
Start: 2020-06-10 — End: 2020-06-10
  Administered 2020-06-10 (×4): 1 mg via INTRAVENOUS
  Filled 2020-06-10: qty 1

## 2020-06-10 MED ORDER — HALOPERIDOL 0.5 MG PO TABS: 0.5000 mg | ORAL_TABLET | ORAL | Status: DC | PRN

## 2020-06-10 MED ORDER — BIOTENE DRY MOUTH MT LIQD: 15.0000 mL | OROMUCOSAL | Status: DC | PRN

## 2020-06-10 MED ORDER — FENTANYL CITRATE (PF) 100 MCG/2ML IJ SOLN: 12.5000 ug | INTRAMUSCULAR | Status: DC | PRN

## 2020-06-10 MED ORDER — SODIUM CHLORIDE 0.9% FLUSH
3.0000 mL | Freq: Two times a day (BID) | INTRAVENOUS | Status: DC
  Administered 2020-06-10 – 2020-06-11 (×2): 3 mL via INTRAVENOUS

## 2020-06-10 NOTE — Progress Notes (Signed)
Pt started on Dilaudid drip per order. Pt resp remain 32/min and labored with stridor, HR 124/min. Pt's skin very hot to touch, rectal temp noted at 100.5. Tylenol supp administered, flexiseal removed. Ativan IV also admin'd per family's request.

## 2020-06-10 NOTE — Progress Notes (Signed)
Versed infusion titrated appropriately. Patient continues to [place]. Further orders initiated. Medications to be administered. Continue to monitor.

## 2020-06-10 NOTE — Progress Notes (Addendum)
PROGRESS NOTE    KRISTOPHER DELK  SXJ:[id number] DOB: Mar 26, 1965 DOA: 05/22/2020 PCP: Caprice Renshaw, MD   Brief Narrative:   Kasy Iannacone Sheltonis a 55 y.o.femalewith medical history significant forCOPD, meningioma, cardiac arrest December 2020, hepatic steatosis,PNES, cognitive impairment, depression,andcomplex partial seizurewho presented to the ED after being found down at her skilled facility.  She was admitted with acute hepatic encephalopathy.  5/23 she did develop some hematemesis and has undergone EGD on 5/24 with findings of some mild esophagitis as well as gastritis.  She has been restarted on a diet with oral PPI.  Paracentesis ordered for today.  Family would like to take patient home with home health and outpatient palliative care once stable for discharge.  Assessment & Plan:   Active Problems:   Cirrhosis ([place])   Acute hepatic encephalopathy   UGI bleed   Acute hepatic encephalopathy -Patient mentation has further decline despite the use of lactulose -Plan of care decided for comfort care and symptomatic management only -The rest of the medications not intended to achieve comfort has been discontinued. -After discussing with palliative care service for end-of-life care decision made for continuation of Dilaudid infusion drip along with Versed infusion; as needed Ativan also available. -Will follow response and continue symptomatic management.  Hematemesis-resolved -Status post EGD 5/24 with findings of esophagitis and gastritis, but no active bleeding. -Care has been transition to symptomatic management and comfort care only -Continue PPI.  AKI -resolved after fluid resuscitation. -Likely prerenal in nature. -No further labs anticipated; plan is for symptomatic management and comfort care.  Mild hypernatremia -Maintenance fluid discontinued at this moment -Plan is to focus on symptomatic management and comfort care.  Lactic acidosis -Condition is stabilized  after fluid resuscitation given -Switching gears into full comfort care and symptomatic management only. -No further blood work anticipated.  MSSA bacteremia -was on Ancef through 5/29 to finish treatment; per GI rec's started on Rocephin which will cover SBP and MSSA.  Patient received antibiotics until 06/09/2020. -At this moment there is plan for no further antibiotic therapy -Patient transitioning to full comfort care and symptomatic management only.  Urinary retention -Foley catheter in place from prior admission -No signs of UTI currently  -continue foley as part of comfort care measures.   Chronic pain -Comfort care and symptomatic management as next Plan of care -Patient receiving IV Dilaudid infusion.  Liver cirrhosis -Some ascites for which paracentesis was done demonstrating no SBP -Patient continued to have elevated ammonia level despite use of lactulose and rifaximin -Rectal lactulose initiated without improvement in symptoms or levels. -At this point decision has been made to transition patient's care to full comfort -No further lactulose will be given -Patient has been started on Dilaudid drip -Continue supportive care.  Anemia of chronic disease -stable currently. -Plan was for transfusion for hemoglobin less than 7; no further labs are plan at this time. -Full comfort care and symptomatic management only.  Recurrent gait instability with frequent falls/dizziness -CT head with no acute findings -Patient acutely decline and after discussion of plan of care with family decision made for full comfort care; prognosis is very poor and hospital death is anticipated.  Complex partial seizures/PNES -Active seizure episode on 06/09/2020 nighttime after missing dosages of antiepileptics due to inability to take p.o.'s. -IV Keppra loading dose given -Now transitioning to full comfort care -Continue as needed lorazepam.  Depression/anxiety. -Unable to take  p.o.'s -Continue as needed lorazepam.  Meningioma -was following with Dr. [name] outpatient -Plan of care  discomfort currently.  Transaminitis -Appears to be a persistent problem in the setting of liver cirrhosis/NAFLD with hepatic congestion -No future labs -Plan is for full comfort care.  Restless leg -Continue holding Mirapex  -Patient receiving full comfort care with Dilaudid drip; unable to take p.o. meds.  Acute respiratory failure with hypoxia -In the setting of aspiration pneumonitis/pneumonia -Continue oxygen supplementation -Currently full comfort care as plan of management. -Continue the use of narcotics drip, Ativan, scopolamine, Robinul and as needed bronchodilators.  DVT prophylaxis:  SCDs Code Status: DNR/DNI. Family Communication:  Son and daughter-in-law updated at bedside. Disposition Plan:  Status is: Inpatient   Dispo: The patient is from: SNF  Anticipated d/c is to:  Hospital death is anticipated; overall very poor prognosis.  Episode of seizure with subsequent acute respiratory failure with hypoxia in the setting of pneumonitis/pneumonia.  Currently full comfort care after advance care planning discussed with patient and receiving treatment with Dilaudid drip.              Difficult to place patient No  Skin Assessment:  I have examined the patient's skin and I agree with the wound assessment as performed by the wound care RN as outlined below:  Pressure Injury 05/25/20 Sacrum Mid Stage 1 -  Intact skin with non-blanchable redness of a localized area usually over a bony prominence. (Active)  05/25/20 1746  Location: Sacrum  Location Orientation: Mid  Staging: Stage 1 -  Intact skin with non-blanchable redness of a localized area usually over a bony prominence.  Wound Description (Comments):   Present on Admission: No    Consultants:   GI  Palliative  Procedures:   See below  Antimicrobials:  Anti-infectives (From  admission, onward)   Start     Dose/Rate Route Frequency Ordered Stop   06/07/20 1000  cefTRIAXone (ROCEPHIN) 2 g in sodium chloride 0.9 % 100 mL IVPB  Status:  Discontinued        2 g 200 mL/hr over 30 Minutes Intravenous Daily 06/06/20 1315 06/10/20 0739   06/06/20 1415  cefTRIAXone (ROCEPHIN) 1 g in sodium chloride 0.9 % 100 mL IVPB        1 g 200 mL/hr over 30 Minutes Intravenous  Once 06/06/20 1315 06/06/20 1402   06/04/2020 2200  rifaximin (XIFAXAN) tablet 550 mg  Status:  Discontinued        550 mg Oral 2 times daily 05/25/2020 1524 06/10/20 0127   06/04/20 1330  cefTRIAXone (ROCEPHIN) 1 g in sodium chloride 0.9 % 100 mL IVPB  Status:  Discontinued        1 g 200 mL/hr over 30 Minutes Intravenous Daily 06/04/20 1320 06/06/20 1315   06/08/2020 1400  ceFAZolin (ANCEF) IVPB 2g/100 mL premix  Status:  Discontinued       Note to Pharmacy: Indication:  MSSA bacteremia  First Dose: Yes Last Day of Therapy:  Jun 10, 2020 Labs - Once weekly:  CBC/D and BMP, Labs - Every other week:  ESR and CRP Method of administration: IV Push Method of administration may be changed at the discretion of home infusion pharmacist based upon assessme   2 g 200 mL/hr over 30 Minutes Intravenous Every 8 hours 06/02/2020 1044 06/06/20 1315      Subjective: Currently unresponsive, requiring high flow nasal cannula supplementation and ended spiking fever.  Overnight with episode of seizure activity and most likely aspiration pneumonitis/pneumonia; x-ray demonstrating new right lower lobe focal infiltrate.  Patient with acute respiratory failure and hypoxia.  Doing poorly and after discussion with family members transitioned into full comfort care.  Objective: Vitals:   06/09/20 2155 06/09/20 2300 06/09/20 2311 06/10/20 0254  BP: (!) 136/104  105/68 (!) 135/94  Pulse: (!) 120  (!) 107 (!) 137  Resp: (!) 24 (!) 24 (!) 23 (!) 24  Temp: 98.8 F (37.1 C)  98.8 F (37.1 C) (!) 101.1 F (38.4 C)  TempSrc: Oral  Oral  Axillary  SpO2: 92% 90% 93% (!) 82%  Weight:      Height:        Intake/Output Summary (Last 24 hours) at 06/10/2020 1222 Last data filed at 06/10/2020 0340 Gross per 24 hour  Intake 2420.13 ml  Output 1600 ml  Net 820.13 ml   Filed Weights   06/08/2020 0544  Weight: 63 kg    Examination: General exam: Currently unresponsive, moaning, uncomfortable, requiring high flow nasal cannula supplementation with the presence of a stridor.  Patient is spiked fever overnight. Respiratory system: Positive tachypnea, upper airway stridor appreciated on examination; no using accessory muscles. Cardiovascular system: Positive sinus tachycardia, no rubs, no gallops, no JVD on exam. Gastrointestinal system: Abdomen is distended, no tenderness to palpation; positive bowel sounds. Central nervous system: Unable to assess current mentation; patient unresponsive. Extremities: No cyanosis or clubbing. Skin: No petechiae. Psychiatry: Unable to assess with current mentation.  Data Reviewed: I have personally reviewed following labs and imaging studies  CBC: Recent Labs  Lab 06/04/20 0533 06/04/20 1532 06/04/20 2049 05/27/2020 0143 05/18/2020 0507 06/06/20 0455 06/08/20 1047  WBC 9.1  --   --   --  9.0 12.2* 14.6*  HGB 9.0*   < > 8.9* 8.2* 8.0* 8.3* 8.1*  HCT 27.6*   < > 28.2* 25.5* 24.9* 26.1* 25.5*  MCV 96.5  --   --   --  98.8 98.9 99.6  PLT 213  --   --   --  178 207 162   < > = values in this interval not displayed.   Basic Metabolic Panel: Recent Labs  Lab 06/04/20 0533 06/04/2020 0507 06/06/20 0455 06/08/20 1047  NA 147* 145 148* 147*  K 3.4* 3.5 3.5 3.4*  CL 114* 116* 117* 119*  CO2 '25 22 23 22  ' GLUCOSE 102* 115* 109* 109*  BUN 29* 23* 19 16  CREATININE 1.01* 0.85 0.79 0.75  CALCIUM 7.8* 7.3* 7.8* 7.6*  MG 2.2 2.0 2.1  --    GFR: Estimated Creatinine Clearance: 66.6 mL/min (by C-G formula based on SCr of 0.75 mg/dL).   Liver Function Tests: Recent Labs  Lab 06/04/20 0533  05/19/2020 0507 06/06/20 0455 06/08/20 1047  AST 180* 175* 194* 147*  ALT 39 31 30 33  ALKPHOS 155* 146* 148* 146*  BILITOT 2.6* 2.7* 3.3* 3.6*  PROT 5.2* 4.8* 5.0* 4.8*  ALBUMIN 2.3* 2.0* 2.3* 2.1*    Recent Labs  Lab 06/04/20 0533 06/04/2020 0555 06/06/20 0455 06/08/20 0529  AMMONIA 55* 77* 97* 110*   Coagulation Profile: Recent Labs  Lab 06/04/20 1403 05/21/2020 0748 06/08/20 1047  INR 2.3* 1.5* 1.5*   Sepsis Labs: Recent Labs  Lab 06/04/20 0533  LATICACIDVEN 1.9    Recent Results (from the past 240 hour(s))  Culture, blood (routine x 2)     Status: None   Collection Time: 06/11/2020  8:12 AM   Specimen: Cath Site; Blood  Result Value Ref Range Status   Specimen Description CATH SITE BOTTLES DRAWN AEROBIC AND ANAEROBIC  Final   Special Requests  Blood Culture adequate volume  Final   Culture   Final    NO GROWTH 5 DAYS Performed at [facility], [address], [place], [place] [address]    Report Status 06/08/2020 FINAL  Final  Culture, blood (routine x 2)     Status: None   Collection Time: 06/07/2020  8:15 AM   Specimen: Left Antecubital; Blood  Result Value Ref Range Status   Specimen Description   Final    LEFT ANTECUBITAL BOTTLES DRAWN AEROBIC AND ANAEROBIC   Special Requests Blood Culture adequate volume  Final   Culture   Final    NO GROWTH 5 DAYS Performed at [facility], [address] [place], [place] [address]    Report Status 06/08/2020 FINAL  Final  SARS CORONAVIRUS 2 (TAT 6-24 HRS) Nasopharyngeal Nasopharyngeal Swab     Status: None   Collection Time: 05/24/2020  8:48 AM   Specimen: Nasopharyngeal Swab  Result Value Ref Range Status   SARS Coronavirus 2 NEGATIVE NEGATIVE Final    Comment: (NOTE) SARS-CoV-2 target nucleic acids are NOT DETECTED.  The SARS-CoV-2 RNA is generally detectable in upper and lower respiratory specimens during the acute phase of infection. Negative results do not preclude SARS-CoV-2 infection, do not rule  out co-infections with other pathogens, and should not be used as the sole basis for treatment or other patient management decisions. Negative results must be combined with clinical observations, patient history, and epidemiological information. The expected result is Negative.  Fact Sheet for Patients: SugarRoll.be  Fact Sheet for Healthcare Providers: https://www.woods-mathews.com/  This test is not yet approved or cleared by the [place] FDA and  has been authorized for detection and/or diagnosis of SARS-CoV-2 by FDA under an Emergency Use Authorization (EUA). This EUA will remain  in effect (meaning this test can be used) for the duration of the COVID-19 declaration under Se ction 564(b)(1) of the Act, 21 U.S.C. section 360bbb-3(b)(1), unless the authorization is terminated or revoked sooner.  Performed at [facility], [place] [address] [place], [place] [address]   MRSA PCR Screening     Status: None   Collection Time: 06/04/20  2:09 PM   Specimen: Nasal Mucosa; Nasopharyngeal  Result Value Ref Range Status   MRSA by PCR NEGATIVE NEGATIVE Final    Comment:        The GeneXpert MRSA Assay (FDA approved for NASAL specimens only), is one component of a comprehensive MRSA colonization surveillance program. It is not intended to diagnose MRSA infection nor to guide or monitor treatment for MRSA infections. Performed at [facility], [address] [place], [place] [address]   Culture, body fluid w Gram Stain-bottle     Status: None (Preliminary result)   Collection Time: 06/06/20 11:15 AM   Specimen: Peritoneal Washings  Result Value Ref Range Status   Specimen Description PERITONEAL  Final   Special Requests BOTTLES DRAWN AEROBIC AND ANAEROBIC 10CC  Final   Culture   Final    NO GROWTH 3 DAYS Performed at [facility], [address], [place], [place] [address]    Report Status PENDING  Incomplete  Gram stain      Status: None   Collection Time: 06/06/20 11:15 AM   Specimen: Peritoneal Washings  Result Value Ref Range Status   Specimen Description PERITONEAL  Final   Special Requests NONE  Final   Gram Stain   Final    CYTOSPIN SMEAR NO ORGANISMS SEEN WBC PRESENT, PREDOMINANTLY MONONUCLEAR Performed at  [facility], [address], [place], [place] [address]    Report Status 06/06/2020 FINAL  Final     Radiology Studies: DG CHEST PORT 1 VIEW  Result Date: 06/09/2020 CLINICAL DATA:  Increased breath sounds in the bases, rapid response EXAM: PORTABLE CHEST 1 VIEW COMPARISON:  06/04/2020 FINDINGS: Cardiac shadow is stable. Lungs are well aerated. Significant increase in patchy airspace opacity is noted particularly on the left consistent with developing pneumonia. No sizable effusion is noted. Upper abdomen is within normal limits. IMPRESSION: Significant increase in new bilateral airspace disease left greater than right consistent with multifocal pneumonia. Electronically Signed   By: Inez Catalina M.D.   On: 06/09/2020 23:02    Scheduled Meds: . budesonide (PULMICORT) nebulizer solution  0.5 mg Nebulization BID  . scopolamine  1 patch Transdermal Q72H  . sodium chloride flush  3 mL Intravenous Q12H   Continuous Infusions: . sodium chloride    . HYDROmorphone 2 mg/hr (06/10/20 1032)     LOS: 7 days    Time spent: 30 minutes    Barton Dubois, MD [facility]  If 7PM-7AM, please contact night-coverage [email or web address] 06/10/2020, 12:22 PM

## 2020-06-10 NOTE — Progress Notes (Signed)
No change in pt condition, continues with resp 32/min, labored and stridorous. HR 124/min. Pt has received IV bolus Dilaudid per order. MD Dyann Kief updated on pt condition.

## 2020-06-10 NOTE — Progress Notes (Signed)
MD Dyann Kief in to speak with pt's son and daughter in law, explained need to move pt to ICU for administration of Versed drip to augment Dilaudid drip currently infusing for comfort. Son and daughter in law state understanding and agreeable.  Pt moved via bed to ICU bed 12. Bedside report given to [facility], Therapist, sports. Son and daughter in law at bedside.

## 2020-06-10 NOTE — Progress Notes (Signed)
After Rapid Response family to bed side. MD came to speak with family in regards to care. After discussion with family. (Niece Clayborn Heron, his wife and mother of the pt) MD ordered to start comfort care measures for pt. Orders carried out. Will continue to monitor.

## 2020-06-10 NOTE — Progress Notes (Signed)
Patient brought to Stepdown to start Versed drip along with continuing Dilaudid drip. Versed order verified and started at 2 mg./hr. Dilaudid flowing at 3mg . Per hour. This to keep patient comfortable for end of life care.

## 2020-06-10 NOTE — Progress Notes (Signed)
Pt condition unchanged, continues with labored, stridorous respirations, moaning some now as well. Unresponsive only to painful stimuli. HR continues 115-125. Pt has had increase in Dilaudid drip rate from 1 mg to 2mg /hr per order, along with 2 bolus dosages of 1 mg each and Ativan 1mg  again with no improvement. MD [facility] notified of same.  Son and daughter in law remain at bedside, updated with each intervention. Son states, "She's had so much medicine already, I don't understand why she's still breathing so hard and uncomfortable." Reassured son and DIL that MD aware and I will update him on all new orders as I receive them. Both state understanding.

## 2020-06-10 NOTE — Progress Notes (Signed)
Son, daughter - in - law, niece, and mother at bedside. They are ready to move to comfort measures only. They recognize that patient is uncomfortable, and that prognosis is poor. We have discussed what comfort measures are, and all questions have been answered.

## 2020-06-10 NOTE — Progress Notes (Signed)
Nurse called about ptspo2 dropped down to 82% on 100% nrb pt placed on 15lpm HFNC with nrb hr 130 spo2 85% rr 30 bs rhochi

## 2020-06-10 NOTE — Progress Notes (Signed)
Pt unresponsive except to painful stimuli. Has labored, stridorous expirations, O2 mask on per RT. Resp 32/min, HR 118/min. Fentanyl drip increased to 17mcg per order for pt comfort. MD Dyann Kief notified of current patient condition and current pain med rate. Son and daughter in law at bedside. Son states, "I just want her to be comfortable and although she's better than earlier, she just don't seem comfortable." Advised of medications ordered and that MD updated. States understanding.

## 2020-06-10 NOTE — Progress Notes (Signed)
Pt's resp currently 28/min, not as stridorous but over last 3 hour pt has been moaning more. HR remains 115-120, pulse more thready than earlier this am. Still only responsive to painful stimuli. Pt has received multiple bolus doses of IV Dilaudid and another dose of Ativan with really no improvement. MD Dyann Kief notified of current condition and doses of meds administered. Awaiting further orders. Son and DIL updated at bedside.

## 2020-06-10 NOTE — Progress Notes (Signed)
In to [place], pt definitely moaning more, although resp 24/min and not as labored as earlier. HR 118/min. DIL states that pt did speak to her earlier, naming off all grandchildren and talking about the [place] and how beautiful heaven was. Pt unresponsive at this time. Son and DIL updated on plan to begin pt on another medicine to assist with comfort care. Advised that MD Dyann Kief would be in to talk with them, both stated understanding.

## 2020-06-11 DIAGNOSIS — K7200 Acute and subacute hepatic failure without coma: Secondary | ICD-10-CM | POA: Diagnosis not present

## 2020-06-11 DIAGNOSIS — Z515 Encounter for palliative care: Secondary | ICD-10-CM | POA: Diagnosis not present

## 2020-06-11 DIAGNOSIS — Z7189 Other specified counseling: Secondary | ICD-10-CM | POA: Diagnosis not present

## 2020-06-11 LAB — CULTURE, BODY FLUID W GRAM STAIN -BOTTLE: Culture: NO GROWTH

## 2020-06-11 LAB — ACID FAST SMEAR (AFB, MYCOBACTERIA): Acid Fast Smear: NEGATIVE

## 2020-06-11 NOTE — Progress Notes (Signed)
Peripheral IV removed from Left Forearm.

## 2020-06-11 NOTE — Progress Notes (Signed)
PROGRESS NOTE    [name]  YDX:[id number] DOB: 08/31/1965 DOA: 06/04/2020 PCP: Caprice Renshaw, MD   Brief Narrative:   [name] a 55 y.o.femalewith medical history significant forCOPD, meningioma, cardiac arrest December 2020, hepatic steatosis,PNES, cognitive impairment, depression,andcomplex partial seizurewho presented to the ED after being found down at her skilled facility.  She was admitted with acute hepatic encephalopathy.  5/23 she did develop some hematemesis and has undergone EGD on 5/24 with findings of some mild esophagitis as well as gastritis.  She has been restarted on a diet with oral PPI.  Paracentesis ordered for today.  Family would like to take patient home with home health and outpatient palliative care once stable for discharge.  Assessment & Plan:   Active Problems:   Cirrhosis ([place])   Acute hepatic encephalopathy   UGI bleed   Acute hepatic encephalopathy -Patient mentation has further decline despite the use of lactulose -Plan of care decided for comfort care and symptomatic management only -The rest of the medications not intended to achieve comfort has been discontinued. -After discussing with palliative care service for end-of-life care decision made for continuation of Dilaudid infusion drip along with Versed infusion; continue as needed Ativan. -Further titration will be done based on patient's symptoms. [facility] death anticipated.  Hematemesis-resolved -Status post EGD 5/24 with findings of esophagitis and gastritis, but no active bleeding. -Care has been transition to symptomatic management and comfort care only -Continue PPI.  AKI -resolved after fluid resuscitation. -Likely prerenal in nature. -No further labs anticipated; plan is for symptomatic management and comfort care.  Mild hypernatremia -Maintenance fluid discontinued at this moment -Plan is to focus on symptomatic management and comfort care.  Lactic  acidosis -Condition is stabilized after fluid resuscitation given -Switching gears into full comfort care and symptomatic management only. -No further blood work anticipated.  MSSA bacteremia -was on Ancef through 5/29 to finish treatment; per GI rec's started on Rocephin which will cover SBP and MSSA.  Patient received antibiotics until 06/09/2020. -At this moment there is plan for no further antibiotic therapy -Patient transitioning to full comfort care and symptomatic management only.  Urinary retention -Foley catheter in place from prior admission -No signs of UTI currently  -continue foley as part of comfort care measures.   Chronic pain -Comfort care and symptomatic management as next Plan of care -Patient receiving IV Dilaudid infusion.  Liver cirrhosis -Some ascites for which paracentesis was done demonstrating no SBP -Patient continued to have elevated ammonia level despite use of lactulose and rifaximin -Rectal lactulose initiated without improvement in symptoms or levels. -At this point decision has been made to transition patient's care to full comfort -No further lactulose will be given -Patient has been started on Dilaudid drip -Continue supportive care.  Anemia of chronic disease -stable currently. -Plan was for transfusion for hemoglobin less than 7; no further labs are plan at this time. -Full comfort care and symptomatic management only.  Recurrent gait instability with frequent falls/dizziness -CT head with no acute findings -Patient acutely decline and after discussion of plan of care with family decision made for full comfort care; prognosis is very poor and hospital death is anticipated.  Complex partial seizures/PNES -Active seizure episode on 06/09/2020 nighttime after missing dosages of antiepileptics due to inability to take p.o.'s. -IV Keppra loading dose given -Now transitioning to full comfort care -Continue as needed  lorazepam.  Depression/anxiety. -Unable to take p.o.'s -Continue as needed lorazepam.  Meningioma -was following with Dr. [name]  outpatient -Plan of care discomfort currently.  Transaminitis -Appears to be a persistent problem in the setting of liver cirrhosis/NAFLD with hepatic congestion -No future labs -Plan is for full comfort care.  Restless leg -Continue holding Mirapex  -Patient receiving full comfort care with Dilaudid drip; unable to take p.o. meds.  Acute respiratory failure with hypoxia -In the setting of aspiration pneumonitis/pneumonia -Continue oxygen supplementation -Currently full comfort care as plan of management. -Continue the use of narcotics drip, Ativan, scopolamine, Robinul and as needed bronchodilators.  DVT prophylaxis:  SCDs Code Status: DNR/DNI. Family Communication:  Son, mother and daughter-in-law updated at bedside. Disposition Plan:  Status is: Inpatient   Dispo: The patient is from: SNF  Anticipated d/c is to:  Hospital death is anticipated; overall very poor prognosis.  Episode of seizure with subsequent acute respiratory failure with hypoxia in the setting of pneumonitis/pneumonia.  Currently full comfort care after advance care planning discussed with patient and receiving treatment with Dilaudid and Versed drip.  In hospital death anticipated.              Difficult to place patient No  Skin Assessment:  I have examined the patient's skin and I agree with the wound assessment as performed by the wound care RN as outlined below:  Pressure Injury 05/25/20 Sacrum Mid Stage 1 -  Intact skin with non-blanchable redness of a localized area usually over a bony prominence. (Active)  05/25/20 1746  Location: Sacrum  Location Orientation: Mid  Staging: Stage 1 -  Intact skin with non-blanchable redness of a localized area usually over a bony prominence.  Wound Description (Comments):   Present on Admission: No     Consultants:   GI  Palliative  Procedures:   See below  Antimicrobials:  Anti-infectives (From admission, onward)   Start     Dose/Rate Route Frequency Ordered Stop   06/07/20 1000  cefTRIAXone (ROCEPHIN) 2 g in sodium chloride 0.9 % 100 mL IVPB  Status:  Discontinued        2 g 200 mL/hr over 30 Minutes Intravenous Daily 06/06/20 1315 06/10/20 0739   06/06/20 1415  cefTRIAXone (ROCEPHIN) 1 g in sodium chloride 0.9 % 100 mL IVPB        1 g 200 mL/hr over 30 Minutes Intravenous  Once 06/06/20 1315 06/06/20 1402   06/07/2020 2200  rifaximin (XIFAXAN) tablet 550 mg  Status:  Discontinued        550 mg Oral 2 times daily 05/21/2020 1524 06/10/20 0127   06/04/20 1330  cefTRIAXone (ROCEPHIN) 1 g in sodium chloride 0.9 % 100 mL IVPB  Status:  Discontinued        1 g 200 mL/hr over 30 Minutes Intravenous Daily 06/04/20 1320 06/06/20 1315   06/08/2020 1400  ceFAZolin (ANCEF) IVPB 2g/100 mL premix  Status:  Discontinued       Note to Pharmacy: Indication:  MSSA bacteremia  First Dose: Yes Last Day of Therapy:  Jun 10, 2020 Labs - Once weekly:  CBC/D and BMP, Labs - Every other week:  ESR and CRP Method of administration: IV Push Method of administration may be changed at the discretion of home infusion pharmacist based upon assessme   2 g 200 mL/hr over 30 Minutes Intravenous Every 8 hours 05/23/2020 1044 06/06/20 1315      Subjective: No following commands, no eating, appears to be comfortable and actively receiving Dilaudid and Versed drip.  Objective: Vitals:   06/11/20 0500 06/11/20 0600 06/11/20 0700 06/11/20  0900  BP:      Pulse: 92 96 94 95  Resp: '16 15 18 16  ' Temp:      TempSrc:      SpO2: (!) 75% (!) 71% (!) 72% (!) 61%  Weight:      Height:        Intake/Output Summary (Last 24 hours) at 06/11/2020 1805 Last data filed at 06/11/2020 1710 Gross per 24 hour  Intake 81.89 ml  Output --  Net 81.89 ml   Filed Weights   05/21/2020 0544  Weight: 63 kg     Examination: General exam: Currently afebrile; appears to be comfortable, unable to answer questions, follow commands or take medications or nutrition by mouth. Respiratory system: Positive upper airways rhonchi; no wheezing, no tachypnea. Cardiovascular system: Regular rate, no rubs or gallops. Gastrointestinal system: Abdomen is distended, soft and no appreciated tenderness or grimaces on palpation. Central nervous system: Unable to assess currently with current mentation. Extremities: No cyanosis or clubbing. Skin: No petechiae. Psychiatry: Unable to assess with current mentation.  Data Reviewed: I have personally reviewed following labs and imaging studies  CBC: Recent Labs  Lab 06/04/20 2049 06/10/2020 0143 06/09/2020 0507 06/06/20 0455 06/08/20 1047  WBC  --   --  9.0 12.2* 14.6*  HGB 8.9* 8.2* 8.0* 8.3* 8.1*  HCT 28.2* 25.5* 24.9* 26.1* 25.5*  MCV  --   --  98.8 98.9 99.6  PLT  --   --  178 207 820   Basic Metabolic Panel: Recent Labs  Lab 05/17/2020 0507 06/06/20 0455 06/08/20 1047  NA 145 148* 147*  K 3.5 3.5 3.4*  CL 116* 117* 119*  CO2 '22 23 22  ' GLUCOSE 115* 109* 109*  BUN 23* 19 16  CREATININE 0.85 0.79 0.75  CALCIUM 7.3* 7.8* 7.6*  MG 2.0 2.1  --    GFR: Estimated Creatinine Clearance: 66.6 mL/min (by C-G formula based on SCr of 0.75 mg/dL).   Liver Function Tests: Recent Labs  Lab 06/11/2020 0507 06/06/20 0455 06/08/20 1047  AST 175* 194* 147*  ALT 31 30 33  ALKPHOS 146* 148* 146*  BILITOT 2.7* 3.3* 3.6*  PROT 4.8* 5.0* 4.8*  ALBUMIN 2.0* 2.3* 2.1*    Recent Labs  Lab 05/15/2020 0555 06/06/20 0455 06/08/20 0529  AMMONIA 77* 97* 110*   Coagulation Profile: Recent Labs  Lab 06/09/2020 0748 06/08/20 1047  INR 1.5* 1.5*   Sepsis Labs: No results for input(s): PROCALCITON, LATICACIDVEN in the last 168 hours.  Recent Results (from the past 240 hour(s))  Culture, blood (routine x 2)     Status: None   Collection Time: 06/04/2020  8:12 AM    Specimen: Cath Site; Blood  Result Value Ref Range Status   Specimen Description CATH SITE BOTTLES DRAWN AEROBIC AND ANAEROBIC  Final   Special Requests Blood Culture adequate volume  Final   Culture   Final    NO GROWTH 5 DAYS Performed at [facility], [address], [place], [place] [address]    Report Status 06/08/2020 FINAL  Final  Culture, blood (routine x 2)     Status: None   Collection Time: 06/02/2020  8:15 AM   Specimen: Left Antecubital; Blood  Result Value Ref Range Status   Specimen Description   Final    LEFT ANTECUBITAL BOTTLES DRAWN AEROBIC AND ANAEROBIC   Special Requests Blood Culture adequate volume  Final   Culture   Final    NO GROWTH 5 DAYS Performed at [facility]  [facility], [address] [place], [place] [address]    Report Status 06/08/2020 FINAL  Final  SARS CORONAVIRUS 2 (TAT 6-24 HRS) Nasopharyngeal Nasopharyngeal Swab     Status: None   Collection Time: 05/27/2020  8:48 AM   Specimen: Nasopharyngeal Swab  Result Value Ref Range Status   SARS Coronavirus 2 NEGATIVE NEGATIVE Final    Comment: (NOTE) SARS-CoV-2 target nucleic acids are NOT DETECTED.  The SARS-CoV-2 RNA is generally detectable in upper and lower respiratory specimens during the acute phase of infection. Negative results do not preclude SARS-CoV-2 infection, do not rule out co-infections with other pathogens, and should not be used as the sole basis for treatment or other patient management decisions. Negative results must be combined with clinical observations, patient history, and epidemiological information. The expected result is Negative.  Fact Sheet for Patients: SugarRoll.be  Fact Sheet for Healthcare Providers: https://www.woods-mathews.com/  This test is not yet approved or cleared by the [place] FDA and  has been authorized for detection and/or diagnosis of SARS-CoV-2 by FDA under an Emergency Use Authorization (EUA). This EUA will  remain  in effect (meaning this test can be used) for the duration of the COVID-19 declaration under Se ction 564(b)(1) of the Act, 21 U.S.C. section 360bbb-3(b)(1), unless the authorization is terminated or revoked sooner.  Performed at [facility], [place] [address] [place], [place] [address]   MRSA PCR Screening     Status: None   Collection Time: 06/04/20  2:09 PM   Specimen: Nasal Mucosa; Nasopharyngeal  Result Value Ref Range Status   MRSA by PCR NEGATIVE NEGATIVE Final    Comment:        The GeneXpert MRSA Assay (FDA approved for NASAL specimens only), is one component of a comprehensive MRSA colonization surveillance program. It is not intended to diagnose MRSA infection nor to guide or monitor treatment for MRSA infections. Performed at [facility], [address], [place], [place] [address]   Acid Fast Smear (AFB)     Status: None   Collection Time: 06/06/20 11:15 AM   Specimen: Peritoneal Cavity; Body Fluid  Result Value Ref Range Status   AFB Specimen Processing Concentration  Final   Acid Fast Smear Negative  Final    Comment: (NOTE) Performed At: [facility] [place], [place] [id number] Rush Farmer MD [id number]    Source (AFB) PERITONEAL  Final    Comment: Performed at [facility], [address], [place], [place] [address]  Culture, body fluid w Gram Stain-bottle     Status: None   Collection Time: 06/06/20 11:15 AM   Specimen: Peritoneal Washings  Result Value Ref Range Status   Specimen Description PERITONEAL  Final   Special Requests BOTTLES DRAWN AEROBIC AND ANAEROBIC 10CC  Final   Culture   Final    NO GROWTH 5 DAYS Performed at [facility], [address] [place], [place] [address]    Report Status 06/11/2020 FINAL  Final  Gram stain     Status: None   Collection Time: 06/06/20 11:15 AM   Specimen: Peritoneal Washings  Result Value Ref Range Status   Specimen Description PERITONEAL  Final   Special  Requests NONE  Final   Gram Stain   Final    CYTOSPIN SMEAR NO ORGANISMS SEEN WBC PRESENT, PREDOMINANTLY MONONUCLEAR Performed at [facility], [address], [place], [place] [address]    Report Status 06/06/2020 FINAL  Final     Radiology Studies: DG  CHEST PORT 1 VIEW  Result Date: 06/09/2020 CLINICAL DATA:  Increased breath sounds in the bases, rapid response EXAM: PORTABLE CHEST 1 VIEW COMPARISON:  06/04/2020 FINDINGS: Cardiac shadow is stable. Lungs are well aerated. Significant increase in patchy airspace opacity is noted particularly on the left consistent with developing pneumonia. No sizable effusion is noted. Upper abdomen is within normal limits. IMPRESSION: Significant increase in new bilateral airspace disease left greater than right consistent with multifocal pneumonia. Electronically Signed   By: Inez Catalina M.D.   On: 06/09/2020 23:02    Scheduled Meds: . budesonide (PULMICORT) nebulizer solution  0.5 mg Nebulization BID  . scopolamine  1 patch Transdermal Q72H  . sodium chloride flush  3 mL Intravenous Q12H   Continuous Infusions: . sodium chloride    . HYDROmorphone 3 mg/hr (06/11/20 1507)  . midazolam 5 mg/hr (06/11/20 1335)     LOS: 8 days    Time spent: 35 minutes    Barton Dubois, MD [facility]  If 7PM-7AM, please contact night-coverage [email or web address] 06/11/2020, 6:05 PM

## 2020-06-11 NOTE — Progress Notes (Signed)
Palliative: [name], [name], is now full comfort care.  She is lying quietly in bed.  Overall she appears comfortable, but has audible stridor with inspiration.  This does not seem, to me, to be moaning.  Her niece, [name], is at bedside.  We talk about transitioning to comfort care and serous needs.  [name] shares that family will be present later.  PMT returns later in the day to find mother [name], son [name] and his wife, niece [name] present at bedside.  We again talked about comfort measures.  They share that they feel that [name] is comfortable.  We talked about on burdening her from oxygen, family agreeable.  We talked about residential hospice, but it seems that [name] is too unstable for transfer.  Her oxygen saturations are in the low 60s.  Family states that their preference is for her to remain in the hospital, dying here in the hospital.  All questions answered, support provided  Conference with attending, bedside nursing staff, transition Of care team related to patient condition, needs, goals of care, anticipated in hospital death.  Plan: Comfort care, infusing Versed/Dilaudid.  Anticipate in-hospital death. Prognosis: Hours to days  40 minutes  Quinn Axe, NP Palliative medicine team Team phone [phone] Greater than 50% of this time was spent counseling and coordinating care related to the above assessment and plan.

## 2020-06-12 DIAGNOSIS — K7460 Unspecified cirrhosis of liver: Secondary | ICD-10-CM | POA: Diagnosis not present

## 2020-06-12 DIAGNOSIS — J9601 Acute respiratory failure with hypoxia: Secondary | ICD-10-CM

## 2020-06-12 DIAGNOSIS — Z515 Encounter for palliative care: Secondary | ICD-10-CM | POA: Diagnosis not present

## 2020-06-12 DIAGNOSIS — R569 Unspecified convulsions: Secondary | ICD-10-CM | POA: Diagnosis not present

## 2020-06-12 DIAGNOSIS — K7290 Hepatic failure, unspecified without coma: Secondary | ICD-10-CM | POA: Diagnosis not present

## 2020-06-12 DIAGNOSIS — J690 Pneumonitis due to inhalation of food and vomit: Secondary | ICD-10-CM | POA: Diagnosis not present

## 2020-06-12 DIAGNOSIS — K7200 Acute and subacute hepatic failure without coma: Secondary | ICD-10-CM | POA: Diagnosis not present

## 2020-06-12 DIAGNOSIS — K922 Gastrointestinal hemorrhage, unspecified: Secondary | ICD-10-CM | POA: Diagnosis not present

## 2020-06-12 DIAGNOSIS — R188 Other ascites: Secondary | ICD-10-CM | POA: Diagnosis not present

## 2020-06-13 NOTE — Progress Notes (Signed)
0545 at patient's bedside no respirations no heart beat, patient pronounced by 2RNs. Patient DNR/comfort care.  MD notified of patient condition. Patient's son notified of patient's condition.

## 2020-06-13 NOTE — Death Summary Note (Signed)
DEATH SUMMARY   Patient Details  Name: [name] MRN: [id number] DOB: 1965-05-28  Admission/Discharge Information   Admit Date:  06/27/2020  Date of Death: Date of Death: 07/06/20  Time of Death: Time of Death: 0545  Length of Stay: 2022/04/15  Referring Physician: Caprice Renshaw, MD   Reason(s) for Hospitalization  Fall and AMS at [facility].  Diagnoses  Preliminary cause of death: acute resp failure with hypoxia due to aspiration PNA.  Secondary Diagnoses (including complications and co-morbidities):  Acute hepatic encephalopathy NASH Cirrhosis ([place]) History of GI bleed with gastric paresis (hematemesis appreciated during this hospitalization). Seizure disorder Chronic bronchitis Hyperlipidemia COPD Meningioma History of cardiac arrest in December 2020 PNES Recent hospitalization due to severe sepsis secondary to MSSA bacteremia (05/12/20>>>05/27/20). Transaminitis Ascites   Brief Hospital Course (including significant findings, care, treatment, and services provided and events leading to death)  [name] is a 55 y.o. year old female who Monica A Sheltonis a 55 y.o.femalewith medical history significant forCOPD, meningioma, cardiac arrest December 2020, hepatic steatosis,PNES, cognitive impairment, depression,andcomplex partial seizurewho presented to the ED after being found down at her skilled facility.She was admitted with acute hepatic encephalopathy. 5/23 she did develop some hematemesis and has undergone EGD on 5/24 with findings of some mild esophagitis as well as gastritis.  She Was started on oral PPI.  Patient mentation continue to fluctuates and the patient became unable to take medications orally.  On 06/09/2020 she experienced a seizure activity with subsequent aspiration pneumonia/pneumonitis and acute respiratory failure with hypoxia.  At the moment goals of care with family members resulted in decisions not to pursuit intubation or invasive interventions.   Patient will transition to full comfort care and symptomatic management only.  Her active ongoing IV antibiotics which we will plan on 2 06/10/2020 would discontinue along with any other medications not intended to keep patient comfortable.  End-of-life protocol initiated and escalated to the need of Dilaudid and Versed drip follow recommendations by palliative care.  On 07/06/20 at 6:38 am patient peacefully expired.  Family notified of events; they were very grateful for the care and services provided to [name].   Pertinent Labs and Studies  Significant Diagnostic Studies CT Head Wo Contrast  Result Date: 06/27/20 CLINICAL DATA:  Head trauma. Found on floor by staff. Baseline altered mental status. EXAM: CT HEAD WITHOUT CONTRAST TECHNIQUE: Contiguous axial images were obtained from the base of the skull through the vertex without intravenous contrast. COMPARISON:  CT head 05/30/2018 FINDINGS: Brain: No evidence of large-territorial acute infarction. No parenchymal hemorrhage. No mass lesion. No extra-axial collection. No mass effect or midline shift. No hydrocephalus. Basilar cisterns are patent. Vascular: No hyperdense vessel. Skull: No acute fracture or focal lesion. Sinuses/Orbits: Paranasal sinuses and mastoid air cells are clear. The orbits are unremarkable. Other: None. IMPRESSION: No acute intracranial abnormality. Electronically Signed   By: Iven Finn M.D.   On: 2020-06-27 06:40   CT Head Wo Contrast  Result Date: 05/29/2020 CLINICAL DATA:  Head injury, Unwitnessed fall, altered mental status EXAM: CT HEAD WITHOUT CONTRAST CT CERVICAL SPINE WITHOUT CONTRAST TECHNIQUE: Multidetector CT imaging of the head and cervical spine was performed following the standard protocol without intravenous contrast. Multiplanar CT image reconstructions of the cervical spine were also generated. COMPARISON:  None. FINDINGS: CT HEAD FINDINGS Brain: Normal anatomic configuration. No abnormal intra or  extra-axial mass lesion or fluid collection. No abnormal mass effect or midline shift. No evidence of acute intracranial hemorrhage or infarct.  Ventricular size is normal. Cerebellum unremarkable. Vascular: Unremarkable Skull: Intact Sinuses/Orbits: Paranasal sinuses are clear. Orbits are unremarkable. Other: There is hypoplasia of the mastoid air cells bilaterally. Middle ear cavities are clear. CT CERVICAL SPINE FINDINGS Alignment: Accentuated cervical lordosis is likely positional in nature. No listhesis. Skull base and vertebrae: The craniocervical junction is unremarkable. The atlantodental interval is normal. No acute fracture of the cervical spine. Soft tissues and spinal canal: No prevertebral fluid or swelling. No visible canal hematoma. Disc levels: There is intervertebral disc space narrowing and endplate remodeling of O0-H2, most severe at C6-7 in keeping with changes of mild to moderate degenerative disc disease. There are posterior disc herniations noted at C2-3 and C3-4 which mildly effaces the anterior canal space. Eccentric posterior disc osteophyte complex, asymmetrically more severe on the left, results in abutment of the thecal sac without remodeling. Multilevel uncovertebral and facet arthrosis results in multilevel neuroforaminal narrowing, most severe bilaterally at C6-7. Upper chest: Mild biapical scarring is present. Other: Right upper extremity PICC line is partially visualized. IMPRESSION: No acute intracranial abnormality.  No calvarial fracture. No acute fracture or listhesis of the cervical spine. Multilevel degenerative disc and degenerative joint disease, most severe at C6-7 Electronically Signed   By: Fidela Salisbury MD   On: 05/29/2020 05:02   CT HEAD WO CONTRAST  Result Date: 05/24/2020 CLINICAL DATA:  55 year old female with altered mental status. EXAM: CT HEAD WITHOUT CONTRAST TECHNIQUE: Contiguous axial images were obtained from the base of the skull through the vertex  without intravenous contrast. COMPARISON:  Head CT dated 05/12/2020. FINDINGS: Evaluation of this exam is limited due to motion artifact. Brain: The ventricles and sulci appropriate size for patient's age. The gray-white matter discrimination is preserved. There is no acute intracranial hemorrhage. No mass effect or midline shift. No extra-axial fluid collection. Vascular: No hyperdense vessel or unexpected calcification. Skull: Normal. Negative for fracture or focal lesion. Sinuses/Orbits: No acute finding. Other: Mild left forehead contusion. IMPRESSION: No acute intracranial pathology. Electronically Signed   By: Anner Crete M.D.   On: 05/24/2020 02:17   CT Cervical Spine Wo Contrast  Result Date: 05/29/2020 CLINICAL DATA:  Head injury, Unwitnessed fall, altered mental status EXAM: CT HEAD WITHOUT CONTRAST CT CERVICAL SPINE WITHOUT CONTRAST TECHNIQUE: Multidetector CT imaging of the head and cervical spine was performed following the standard protocol without intravenous contrast. Multiplanar CT image reconstructions of the cervical spine were also generated. COMPARISON:  None. FINDINGS: CT HEAD FINDINGS Brain: Normal anatomic configuration. No abnormal intra or extra-axial mass lesion or fluid collection. No abnormal mass effect or midline shift. No evidence of acute intracranial hemorrhage or infarct. Ventricular size is normal. Cerebellum unremarkable. Vascular: Unremarkable Skull: Intact Sinuses/Orbits: Paranasal sinuses are clear. Orbits are unremarkable. Other: There is hypoplasia of the mastoid air cells bilaterally. Middle ear cavities are clear. CT CERVICAL SPINE FINDINGS Alignment: Accentuated cervical lordosis is likely positional in nature. No listhesis. Skull base and vertebrae: The craniocervical junction is unremarkable. The atlantodental interval is normal. No acute fracture of the cervical spine. Soft tissues and spinal canal: No prevertebral fluid or swelling. No visible canal hematoma.  Disc levels: There is intervertebral disc space narrowing and endplate remodeling of Z2-Y4, most severe at C6-7 in keeping with changes of mild to moderate degenerative disc disease. There are posterior disc herniations noted at C2-3 and C3-4 which mildly effaces the anterior canal space. Eccentric posterior disc osteophyte complex, asymmetrically more severe on the left, results in abutment of the thecal  sac without remodeling. Multilevel uncovertebral and facet arthrosis results in multilevel neuroforaminal narrowing, most severe bilaterally at C6-7. Upper chest: Mild biapical scarring is present. Other: Right upper extremity PICC line is partially visualized. IMPRESSION: No acute intracranial abnormality.  No calvarial fracture. No acute fracture or listhesis of the cervical spine. Multilevel degenerative disc and degenerative joint disease, most severe at C6-7 Electronically Signed   By: Fidela Salisbury MD   On: 05/29/2020 05:02   US Paracentesis  Result Date: 06/06/2020 INDICATION: Ascites EXAM: ULTRASOUND GUIDED DIAGNOSTIC AND THERAPEUTIC PARACENTESIS MEDICATIONS: 10 cc 1% lidocaine COMPLICATIONS: None immediate. PROCEDURE: Informed written consent was obtained from the patient after a discussion of the risks, benefits and alternatives to treatment. A timeout was performed prior to the initiation of the procedure. Initial ultrasound scanning demonstrates a large amount of ascites within the right lower abdominal quadrant. The right lower abdomen was prepped and draped in the usual sterile fashion. 1% lidocaine was used for local anesthesia. Following this, a Yueh catheter was introduced. An ultrasound image was saved for documentation purposes. The paracentesis was performed. The catheter was removed and a dressing was applied. The patient tolerated the procedure well without immediate post procedural complication. Patient received post-procedure intravenous albumin; see nursing notes for details. FINDINGS:  A total of approximately 3 liters of yellow fluid was removed. Samples were sent to the laboratory as requested by the clinical team. IMPRESSION: Successful ultrasound-guided paracentesis yielding 3 liters of peritoneal fluid. Read by Lavonia Drafts [facility] Electronically Signed   By: Lavonia Dana M.D.   On: 06/06/2020 12:01   US Paracentesis  Result Date: 05/14/2020 INDICATION: Fever, abdominal pain, question spontaneous bacterial peritonitis, ascites on CT EXAM: ULTRASOUND GUIDED DIAGNOSTIC PARACENTESIS MEDICATIONS: None COMPLICATIONS: None immediate PROCEDURE: Informed written consent was obtained from the patient after a discussion of the risks, benefits and alternatives to treatment. A timeout was performed prior to the initiation of the procedure. Initial ultrasound scanning demonstrates a small amount of ascites within the right lower abdominal quadrant. The right lower abdomen was prepped and draped in the usual sterile fashion. 1% lidocaine was used for local anesthesia. Following this, a 5 [place] Yueh catheter was introduced. An ultrasound image was saved for documentation purposes. The paracentesis was performed. The catheter was removed and a dressing was applied. The patient tolerated the procedure well without immediate post procedural complication. FINDINGS: A total of approximately 20 mL of clear yellow ascitic fluid was removed. Samples were sent to the laboratory as requested by the clinical team. IMPRESSION: Successful ultrasound-guided paracentesis yielding 20 mL of peritoneal fluid for diagnostic testing. Electronically Signed   By: Lavonia Dana M.D.   On: 05/14/2020 12:39   DG CHEST PORT 1 VIEW  Result Date: 06/09/2020 CLINICAL DATA:  Increased breath sounds in the bases, rapid response EXAM: PORTABLE CHEST 1 VIEW COMPARISON:  06/04/2020 FINDINGS: Cardiac shadow is stable. Lungs are well aerated. Significant increase in patchy airspace opacity is noted particularly on the left consistent with  developing pneumonia. No sizable effusion is noted. Upper abdomen is within normal limits. IMPRESSION: Significant increase in new bilateral airspace disease left greater than right consistent with multifocal pneumonia. Electronically Signed   By: Inez Catalina M.D.   On: 06/09/2020 23:02   DG CHEST PORT 1 VIEW  Result Date: 06/04/2020 CLINICAL DATA:  PICC line placement EXAM: PORTABLE CHEST 1 VIEW COMPARISON:  04/03/2020 FINDINGS: Right upper DVT PICC now terminates in the region of the right subclavian artery. Cardiomediastinal silhouette  and pulmonary vasculature within normal limits. Diffuse bilateral airspace opacities similar to prior examination. Hyperdense content of the colon likely related to barium utilized during swallow study. IMPRESSION: Right upper extremity PICC terminates in the region of the right subclavian vein. Unchanged diffuse bilateral airspace opacities. These results will be called to the ordering clinician or representative by the Radiologist Assistant, and communication documented in the PACS or [employer]. Electronically Signed   By: Miachel Roux M.D.   On: 06/04/2020 15:56   DG Chest Port 1 View  Result Date: 05/30/2020 CLINICAL DATA:  55 year old female found down.  Weakness. EXAM: PORTABLE CHEST 1 VIEW COMPARISON:  Portable chest 05/24/2020 and earlier. Modified barium swallow 05/25/2020. FINDINGS: Residual barium mixed with retained stool in the upper abdominal large bowel. Continued low lung volumes. Stable right PICC line. Mediastinal contours remain normal. Diffuse coarse bilateral pulmonary opacity appears to be chronic and stable over this year's series of exams. No superimposed pneumothorax, pleural effusion or confluent pulmonary opacity. Visualized tracheal air column is within normal limits. No acute osseous abnormality identified. IMPRESSION: 1. Continued low lung volumes and coarse bilateral pulmonary interstitial markings which do not appear significantly  changed since January. Favor a chronic interstitial lung disease over an acute pulmonary process. 2. PICC line remains in place. 3. Residual barium from a modified swallow 9 days ago mixed with residual stool in the colon. Electronically Signed   By: Genevie Ann M.D.   On: 06/02/2020 07:27   DG CHEST PORT 1 VIEW  Result Date: 05/24/2020 CLINICAL DATA:  55 year old female with tachycardia. EXAM: PORTABLE CHEST 1 VIEW COMPARISON:  Chest radiograph dated 05/22/2020. FINDINGS: Right-sided PICC with tip at the cavoatrial junction. No interval change in bilateral interstitial reticulonodular densities. No pleural effusion or pneumothorax. Stable cardiomediastinal silhouette. No acute osseous pathology. IMPRESSION: No interval change in the bilateral interstitial reticulonodular densities. Electronically Signed   By: Anner Crete M.D.   On: 05/24/2020 01:33   DG CHEST PORT 1 VIEW  Result Date: 05/22/2020 CLINICAL DATA:  Cough EXAM: PORTABLE CHEST 1 VIEW COMPARISON:  Chest radiograph dated 05/20/2020. FINDINGS: The heart size and mediastinal contours are within normal limits. Moderate bilateral interstitial and airspace opacities appear unchanged since prior exam. A right upper extremity peripherally inserted central venous catheter tip overlies the superior cavoatrial junction. IMPRESSION: Moderate bilateral interstitial and airspace opacities are not significantly changed and likely represent pulmonary edema. Electronically Signed   By: Zerita Boers M.D.   On: 05/22/2020 16:46   DG CHEST PORT 1 VIEW  Result Date: 05/20/2020 CLINICAL DATA:  Pulmonary edema EXAM: PORTABLE CHEST 1 VIEW COMPARISON:  05/16/2020 chest radiograph. FINDINGS: Right PICC terminates over the right atrium. Stable cardiomediastinal silhouette with normal heart size. No pneumothorax. No pleural effusion. Patchy hazy opacities throughout both lungs, significantly improved. IMPRESSION: Significantly improved patchy hazy opacities throughout  both lungs, compatible with improving pulmonary edema. Electronically Signed   By: Ilona Sorrel M.D.   On: 05/20/2020 07:32   DG CHEST PORT 1 VIEW  Result Date: 05/16/2020 CLINICAL DATA:  Shortness of breath. EXAM: PORTABLE CHEST 1 VIEW COMPARISON:  Chest x-ray dated May 12, 2020. FINDINGS: The heart size and mediastinal contours are within normal limits. Prominent diffuse interstitial thickening with bilateral perihilar opacities. Small bilateral pleural effusions. No pneumothorax. No acute osseous abnormality. IMPRESSION: 1. Moderate to severe pulmonary edema with small bilateral pleural effusions. Electronically Signed   By: Titus Dubin M.D.   On: 05/16/2020 12:57   DG Swallowing  Func-Speech Pathology  Result Date: 05/25/2020 Objective Swallowing Evaluation: Type of Study: MBS-Modified Barium Swallow Study  Patient Details Name: [name] MRN: [id number] Date of Birth: 09/01/1965 Today's Date: 05/25/2020 Time: SLP Start Time (ACUTE ONLY): 1436 -SLP Stop Time (ACUTE ONLY): 1505 SLP Time Calculation (min) (ACUTE ONLY): 29 min Past Medical History: Past Medical History: Diagnosis Date . Arthritis   "knees" (04/22/2012) . Brain tumor (benign) ([place])  . Chronic bronchitis ([place])   "yearly; when the weather changes" (04/22/2012) . Colon polyp  . Colon polyps   adenomatous and hyperplastic- . Depression  . Eczema  . Epilepsy ([place])   "been having them right often here lately" (04/22/2012) . Fatty liver  . High cholesterol  . History of kidney stones  . Osteoarthritis   [name] 04/22/2012 . Other convulsions 05/21/12  non-epileptic spells . Restless leg  . Seizures ([place])  . Vertigo  Past Surgical History: Past Surgical History: Procedure Laterality Date . ABDOMINAL HYSTERECTOMY  2001 . BLADDER SUSPENSION   . BUNIONECTOMY Left 2000 . CESAREAN SECTION  1987; 1988 . COLONOSCOPY   . JOINT REPLACEMENT   . MASS EXCISION  10/22/2011  Procedure: EXCISION MASS;  Surgeon: [name], MD;  Location: [facility];  Service:  General;  Laterality: Right;  excision right buttock mass . TEE WITHOUT CARDIOVERSION N/A 05/16/2020  Procedure: TRANSESOPHAGEAL ECHOCARDIOGRAM (TEE) WITH PROPOFOL;  Surgeon: Arnoldo Lenis, MD;  Location: [facility];  Service: Endoscopy;  Laterality: N/A; . TOTAL HIP ARTHROPLASTY Left 1993; 1995; 2000 . TOTAL KNEE ARTHROPLASTY Left 05/07/2015  Procedure: TOTAL LEFT KNEE ARTHROPLASTY;  Surgeon: [name], MD;  Location: [facility];  Service: Orthopedics;  Laterality: Left; . TOTAL KNEE ARTHROPLASTY Right 10/01/2015  Procedure: RIGHT TOTAL KNEE ARTHROPLASTY;  Surgeon: [name], MD;  Location: [facility];  Service: Orthopedics;  Laterality: Right; HPI: 55 year old female with a history of COPD, meningioma, cardiac arrest December 2020, hepatic steatosis, PNES, cognitive impairment, depression, complex partial seizure presenting with generalized weakness and a fall.  This has been a recurrent problem for the patient.  She was admitted on 2/7 to 02/24/20 with a similar presentation.  At that time she was treated for acute metabolic encephalopathy which was thought to be multifactorial including elevated ammonia, dehydration and dopamine agonist.  BSE requested  No data recorded Assessment / Plan / Recommendation CHL IP CLINICAL IMPRESSIONS 05/25/2020 Clinical Impression Pt presents with moderate oropharyngeal dysphagia characterized by silent aspiration of thin liquids; amount of aspiration is dependent on presentation type and bolus size. Pt demonstrates premature spillage, decreased epiglottic deflection, decreased laryngeal vestibule closure and decreased pharyngeal squeeze. Tsp sips of thin liquids result in trace flash penetration, cup sips result in penetration with trace amounts falling to the cords that are not always visualized being cleared from the laryngeal vestibule; straw sips of thin result in moderate amounts falling to the cords and eventually being aspirated. Pt also demonstrated a moderate amount of  aspiration of a cup sip of thin with the barium tablet administration (this unfortunately occurred prior to floro being turned on, however evidence of aspiration present on the tracheal wall); All aspiration was silent and required verbal cues for Pt to cough. Further, cough was inconsitently effective in clearing aspirates. With NTL Pt demonstrated occasional flash penetration and puree and regular textures were consumed without incident. Recommend continue with a D2/fine chop diet and NTL. Pt is cognitively unable to consistently utilize strategies. SLP will continue to follow acutely SLP Visit Diagnosis Dysphagia, unspecified (R13.10) Attention  and concentration deficit following -- Frontal lobe and executive function deficit following -- Impact on safety and function Mild aspiration risk;Moderate aspiration risk   CHL IP TREATMENT RECOMMENDATION 05/25/2020 Treatment Recommendations F/U MBS in --- days (Comment);Therapy as outlined in treatment plan below   Prognosis 05/25/2020 Prognosis for Safe Diet Advancement Good Barriers to Reach Goals Cognitive deficits Barriers/Prognosis Comment -- CHL IP DIET RECOMMENDATION 05/25/2020 SLP Diet Recommendations Dysphagia 2 (Fine chop) solids;Nectar thick liquid Liquid Administration via Cup;Straw Medication Administration Whole meds with puree Compensations Minimize environmental distractions;Slow rate;Small sips/bites Postural Changes Seated upright at 90 degrees   CHL IP OTHER RECOMMENDATIONS 05/25/2020 Recommended Consults -- Oral Care Recommendations Oral care BID Other Recommendations Order thickener from pharmacy   CHL IP FOLLOW UP RECOMMENDATIONS 05/25/2020 Follow up Recommendations 24 hour supervision/assistance   CHL IP FREQUENCY AND DURATION 05/25/2020 Speech Therapy Frequency (ACUTE ONLY) min 1 x/week Treatment Duration 1 week      CHL IP ORAL PHASE 05/25/2020 Oral Phase Impaired Oral - Pudding Teaspoon -- Oral - Pudding Cup -- Oral - Honey Teaspoon -- Oral - Honey Cup  -- Oral - Nectar Teaspoon Premature spillage;Decreased bolus cohesion;Piecemeal swallowing Oral - Nectar Cup Premature spillage;Decreased bolus cohesion;Piecemeal swallowing Oral - Nectar Straw NT Oral - Thin Teaspoon Decreased bolus cohesion;Premature spillage;Piecemeal swallowing Oral - Thin Cup Premature spillage;Decreased bolus cohesion;Piecemeal swallowing Oral - Thin Straw Premature spillage;Decreased bolus cohesion;Piecemeal swallowing Oral - Puree WFL Oral - Mech Soft -- Oral - Regular Piecemeal swallowing Oral - Multi-Consistency -- Oral - Pill (No Data) Oral Phase - Comment --  CHL IP PHARYNGEAL PHASE 05/25/2020 Pharyngeal Phase Impaired Pharyngeal- Pudding Teaspoon -- Pharyngeal -- Pharyngeal- Pudding Cup -- Pharyngeal -- Pharyngeal- Honey Teaspoon -- Pharyngeal -- Pharyngeal- Honey Cup -- Pharyngeal -- Pharyngeal- Nectar Teaspoon NT Pharyngeal -- Pharyngeal- Nectar Cup Pharyngeal residue - valleculae;Pharyngeal residue - pyriform;Penetration/Aspiration during swallow Pharyngeal Material enters airway, remains ABOVE vocal cords then ejected out Pharyngeal- Nectar Straw NT Pharyngeal -- Pharyngeal- Thin Teaspoon Pharyngeal residue - valleculae;Pharyngeal residue - pyriform;Penetration/Aspiration during swallow;Penetration/Aspiration before swallow;Trace aspiration;Reduced epiglottic inversion;Delayed swallow initiation-pyriform sinuses;Reduced pharyngeal peristalsis;Reduced airway/laryngeal closure Pharyngeal Material enters airway, passes BELOW cords without attempt by patient to eject out (silent aspiration) Pharyngeal- Thin Cup Pharyngeal residue - valleculae;Pharyngeal residue - pyriform;Penetration/Aspiration during swallow;Penetration/Aspiration before swallow;Trace aspiration;Reduced epiglottic inversion;Delayed swallow initiation-pyriform sinuses;Reduced pharyngeal peristalsis;Reduced airway/laryngeal closure Pharyngeal Material enters airway, passes BELOW cords without attempt by patient to eject  out (silent aspiration) Pharyngeal- Thin Straw Pharyngeal residue - valleculae;Pharyngeal residue - pyriform;Penetration/Aspiration during swallow;Penetration/Aspiration before swallow;Reduced epiglottic inversion;Delayed swallow initiation-pyriform sinuses;Reduced pharyngeal peristalsis;Reduced airway/laryngeal closure;Moderate aspiration Pharyngeal Material enters airway, passes BELOW cords without attempt by patient to eject out (silent aspiration) Pharyngeal- Puree Pharyngeal residue - valleculae Pharyngeal -- Pharyngeal- Mechanical Soft NT Pharyngeal -- Pharyngeal- Regular Pharyngeal residue - valleculae Pharyngeal -- Pharyngeal- Multi-consistency -- Pharyngeal -- Pharyngeal- Pill -- Pharyngeal -- Pharyngeal Comment --  CHL IP CERVICAL ESOPHAGEAL PHASE 05/25/2020 Cervical Esophageal Phase WFL Pudding Teaspoon -- Pudding Cup -- Honey Teaspoon -- Honey Cup -- Nectar Teaspoon -- Nectar Cup -- Nectar Straw -- Thin Teaspoon -- Thin Cup -- Thin Straw -- Puree -- Mechanical Soft -- Regular -- Multi-consistency -- Pill -- Cervical Esophageal Comment -- Amelia H. Roddie Mc, CCC-SLP Speech Language Pathologist Wende Bushy 05/25/2020, 4:05 PM              ECHOCARDIOGRAM COMPLETE  Result Date: 05/15/2020    ECHOCARDIOGRAM REPORT   Patient Name:   [name] Date of Exam: 05/15/2020  Medical Rec #:  [id number]       Height:       60.0 in Accession #:    [id number]      Weight:       127.9 lb Date of Birth:  [birth year]        BSA:          1.543 m Patient Age:    15 years        BP:           88/64 mmHg Patient Gender: F               HR:           95 bpm. Exam Location:  [facility] Procedure: 2D Echo, Cardiac Doppler and Color Doppler Indications:    Bacteremia R78.81  History:        Patient has prior history of Echocardiogram examinations, most                 recent 02/23/2020. Risk Factors:Dyslipidemia. Cardiac Arrest,                 Elevated Liver Function Test.  Sonographer:    Alvino Chapel RCS Referring  Phys: [id number] [place]  1. Left ventricular ejection fraction, by estimation, is 60 to 65%. The left ventricle has normal function. The left ventricle has no regional wall motion abnormalities. Left ventricular diastolic parameters are indeterminate.  2. Right ventricular systolic function is normal. The right ventricular size is normal.  3. Left atrial size was moderately dilated.  4. The mitral valve is normal in structure. No evidence of mitral valve regurgitation. No evidence of mitral stenosis.  5. The aortic valve has an indeterminant number of cusps. Aortic valve regurgitation is not visualized. No aortic stenosis is present.  6. The inferior vena cava is normal in size with greater than 50% respiratory variability, suggesting right atrial pressure of 3 mmHg. FINDINGS  Left Ventricle: Left ventricular ejection fraction, by estimation, is 60 to 65%. The left ventricle has normal function. The left ventricle has no regional wall motion abnormalities. The left ventricular internal cavity size was normal in size. There is  no left ventricular hypertrophy. Left ventricular diastolic parameters are indeterminate. Right Ventricle: The right ventricular size is normal. No increase in right ventricular wall thickness. Right ventricular systolic function is normal. Left Atrium: Left atrial size was moderately dilated. Right Atrium: Right atrial size was not well visualized. Pericardium: There is no evidence of pericardial effusion. Mitral Valve: The mitral valve is normal in structure. No evidence of mitral valve regurgitation. No evidence of mitral valve stenosis. Tricuspid Valve: The tricuspid valve is normal in structure. Tricuspid valve regurgitation is not demonstrated. No evidence of tricuspid stenosis. Aortic Valve: The aortic valve has an indeterminant number of cusps. Aortic valve regurgitation is not visualized. No aortic stenosis is present. Aortic valve mean gradient measures 5.1 mmHg.  Aortic valve peak gradient measures 8.2 mmHg. Aortic valve area, by VTI measures 2.13 cm. Pulmonic Valve: The pulmonic valve was not well visualized. Pulmonic valve regurgitation is not visualized. No evidence of pulmonic stenosis. Aorta: The aortic root is normal in size and structure. Pulmonary Artery: Inadequate TR jet, indeterminant PASP. Venous: The inferior vena cava is normal in size with greater than 50% respiratory variability, suggesting right atrial pressure of 3 mmHg. IAS/Shunts: No atrial level shunt detected by color flow Doppler.  LEFT VENTRICLE PLAX 2D LVIDd:  4.80 cm  Diastology LVIDs:         3.10 cm  LV e' medial:    10.60 cm/s LV PW:         1.00 cm  LV E/e' medial:  11.3 LV IVS:        1.00 cm  LV e' lateral:   9.14 cm/s LVOT diam:     1.80 cm  LV E/e' lateral: 13.1 LV SV:         64 LV SV Index:   41 LVOT Area:     2.54 cm  RIGHT VENTRICLE RV S prime:     16.30 cm/s TAPSE (M-mode): 2.6 cm LEFT ATRIUM             Index       RIGHT ATRIUM           Index LA diam:        4.20 cm 2.72 cm/m  RA Area:     14.20 cm LA Vol (A2C):   53.8 ml 34.86 ml/m RA Volume:   36.10 ml  23.39 ml/m LA Vol (A4C):   64.2 ml 41.60 ml/m LA Biplane Vol: 62.5 ml 40.49 ml/m  AORTIC VALVE AV Area (Vmax):    2.40 cm AV Area (Vmean):   2.00 cm AV Area (VTI):     2.13 cm AV Vmax:           143.34 cm/s AV Vmean:          109.425 cm/s AV VTI:            0.301 m AV Peak Grad:      8.2 mmHg AV Mean Grad:      5.1 mmHg LVOT Vmax:         135.00 cm/s LVOT Vmean:        86.200 cm/s LVOT VTI:          0.251 m LVOT/AV VTI ratio: 0.84  AORTA Ao Root diam: 3.00 cm MITRAL VALVE MV Area (PHT): 3.97 cm     SHUNTS MV Decel Time: 191 msec     Systemic VTI:  0.25 m MV E velocity: 120.00 cm/s  Systemic Diam: 1.80 cm MV A velocity: 87.30 cm/s MV E/A ratio:  1.37 Carlyle Dolly MD Electronically signed by Carlyle Dolly MD Signature Date/Time: 05/15/2020/2:16:14 PM    Final    ECHO TEE  Result Date: 05/16/2020     TRANSESOPHOGEAL ECHO REPORT   Patient Name:   [name] Date of Exam: 05/16/2020 Medical Rec #:  [id number]       Height:       60.0 in Accession #:    [id number]      Weight:       127.9 lb Date of Birth:  Dec 30, 1965        BSA:          1.543 m Patient Age:    47 years        BP:           123/85 mmHg Patient Gender: F               HR:           101 bpm. Exam Location:  [facility] Procedure: Transesophageal Echo, Cardiac Doppler and Color Doppler Indications:    Bacteremia  History:        Patient has prior history of Echocardiogram examinations, most  recent 05/15/2020. Risk Factors:Dyslipidemia. Cardiac Arrest,                 Elevated Liver Function Test.  Sonographer:    Alvino Chapel RCS Referring Phys: [id number] [place]: The transesophogeal probe was passed without difficulty through the esophogus of the patient. Sedation performed by different physician. The patient developed no complications during the procedure. IMPRESSIONS  1. Left ventricular ejection fraction, by estimation, is 60 to 65%. The left ventricle has normal function.  2. Right ventricular systolic function is normal. The right ventricular size is normal.  3. Left atrial size was mildly dilated. No left atrial/left atrial appendage thrombus was detected. The LAA emptying velocity was 85 cm/s.  4. Right atrial size was mildly dilated.  5. The mitral valve is normal in structure. Trivial mitral valve regurgitation. No evidence of mitral stenosis.  6. The aortic valve is tricuspid. Aortic valve regurgitation is not visualized. No aortic stenosis is present. Conclusion(s)/Recommendation(s): No evidence of vegetation/infective endocarditis on this transesophageal echocardiogram. FINDINGS  Left Ventricle: Left ventricular ejection fraction, by estimation, is 60 to 65%. The left ventricle has normal function. The left ventricular internal cavity size was normal in size. Right Ventricle: The right ventricular size  is normal. No increase in right ventricular wall thickness. Right ventricular systolic function is normal. Left Atrium: Left atrial size was mildly dilated. No left atrial/left atrial appendage thrombus was detected. The LAA emptying velocity was 85 cm/s. Right Atrium: Right atrial size was mildly dilated. Pericardium: There is no evidence of pericardial effusion. Mitral Valve: The mitral valve is normal in structure. Trivial mitral valve regurgitation. No evidence of mitral valve stenosis. Tricuspid Valve: The tricuspid valve is normal in structure. Tricuspid valve regurgitation is trivial. No evidence of tricuspid stenosis. Aortic Valve: The aortic valve is tricuspid. Aortic valve regurgitation is not visualized. No aortic stenosis is present. Pulmonic Valve: The pulmonic valve was normal in structure. Pulmonic valve regurgitation is not visualized. No evidence of pulmonic stenosis. Aorta: The aortic root is normal in size and structure. IAS/Shunts: No atrial level shunt detected by color flow Doppler. Carlyle Dolly MD Electronically signed by Carlyle Dolly MD Signature Date/Time: 05/16/2020/1:28:24 PM    Final    [name] ASCITES (ABDOMEN LIMITED)  Result Date: 05/19/2020 CLINICAL DATA:  Cirrhosis, ascites EXAM: LIMITED ABDOMEN ULTRASOUND FOR ASCITES TECHNIQUE: Limited ultrasound survey for ascites was performed in all four abdominal quadrants. COMPARISON:  05/14/2020 FINDINGS: Small volume ascites identified in the lower quadrants bilaterally. More ascites is seen at the midline at the pelvis. Volume of ascites is sufficient for diagnostic paracentesis. IMPRESSION: Sufficient ascites for diagnostic paracentesis. Electronically Signed   By: Lavonia Dana M.D.   On: 05/14/2020 16:16   [name] ASCITES (ABDOMEN LIMITED)  Result Date: 05/14/2020 CLINICAL DATA:  Ascites, question sufficient for paracentesis, clinical concern for spontaneous bacterial peritonitis EXAM: LIMITED ABDOMEN ULTRASOUND FOR ASCITES TECHNIQUE:  Limited ultrasound survey for ascites was performed in all four abdominal quadrants. COMPARISON:  CT abdomen and pelvis 05/13/2020 FINDINGS: Small volume ascites identified in the lower quadrants bilaterally. Volume of ascites has probably slightly increased since prior CT. While the volume of fluid is insufficient for therapeutic paracentesis, a small volume of ascites may be obtainable for diagnostic purposes. Diagnostic paracentesis will be attempted. IMPRESSION: Small volume ascites, likely sufficient volume for diagnostic paracentesis. Electronically Signed   By: Lavonia Dana M.D.   On: 05/14/2020 11:11   [name] EKG SITE RITE  Result Date: 05/17/2020 If Site  Rite image not attached, placement could not be confirmed due to current cardiac rhythm.   Microbiology Recent Results (from the past 240 hour(s))  Culture, blood (routine x 2)     Status: None   Collection Time: 05/24/2020  8:12 AM   Specimen: Cath Site; Blood  Result Value Ref Range Status   Specimen Description CATH SITE BOTTLES DRAWN AEROBIC AND ANAEROBIC  Final   Special Requests Blood Culture adequate volume  Final   Culture   Final    NO GROWTH 5 DAYS Performed at [facility], [address] [place], [place] [address]    Report Status 06/08/2020 FINAL  Final  Culture, blood (routine x 2)     Status: None   Collection Time: 05/15/2020  8:15 AM   Specimen: Left Antecubital; Blood  Result Value Ref Range Status   Specimen Description   Final    LEFT ANTECUBITAL BOTTLES DRAWN AEROBIC AND ANAEROBIC   Special Requests Blood Culture adequate volume  Final   Culture   Final    NO GROWTH 5 DAYS Performed at [facility], [address], [place], [place] [address]    Report Status 06/08/2020 FINAL  Final  SARS CORONAVIRUS 2 (TAT 6-24 HRS) Nasopharyngeal Nasopharyngeal Swab     Status: None   Collection Time: 06/02/2020  8:48 AM   Specimen: Nasopharyngeal Swab  Result Value Ref Range Status   SARS Coronavirus 2 NEGATIVE NEGATIVE Final     Comment: (NOTE) SARS-CoV-2 target nucleic acids are NOT DETECTED.  The SARS-CoV-2 RNA is generally detectable in upper and lower respiratory specimens during the acute phase of infection. Negative results do not preclude SARS-CoV-2 infection, do not rule out co-infections with other pathogens, and should not be used as the sole basis for treatment or other patient management decisions. Negative results must be combined with clinical observations, patient history, and epidemiological information. The expected result is Negative.  Fact Sheet for Patients: SugarRoll.be  Fact Sheet for Healthcare Providers: https://www.woods-mathews.com/  This test is not yet approved or cleared by the [place] FDA and  has been authorized for detection and/or diagnosis of SARS-CoV-2 by FDA under an Emergency Use Authorization (EUA). This EUA will remain  in effect (meaning this test can be used) for the duration of the COVID-19 declaration under Se ction 564(b)(1) of the Act, 21 U.S.C. section 360bbb-3(b)(1), unless the authorization is terminated or revoked sooner.  Performed at [facility], [place] [address] [place],  [address]   MRSA PCR Screening     Status: None   Collection Time: 06/04/20  2:09 PM   Specimen: Nasal Mucosa; Nasopharyngeal  Result Value Ref Range Status   MRSA by PCR NEGATIVE NEGATIVE Final    Comment:        The GeneXpert MRSA Assay (FDA approved for NASAL specimens only), is one component of a comprehensive MRSA colonization surveillance program. It is not intended to diagnose MRSA infection nor to guide or monitor treatment for MRSA infections. Performed at [facility], [address], [place],  [address]   Acid Fast Smear (AFB)     Status: None   Collection Time: 06/06/20 11:15 AM   Specimen: Peritoneal Cavity; Body Fluid  Result Value Ref Range Status   AFB Specimen Processing Concentration  Final    Acid Fast Smear Negative  Final    Comment: (NOTE) Performed At: [facility] [place], [place] [id number] Rush Farmer MD NO:[id number]    Source (AFB) PERITONEAL  Final    Comment: Performed at [facility], [address], [place], [place] [address]  Culture, body fluid w Gram Stain-bottle     Status: None   Collection Time: 06/06/20 11:15 AM   Specimen: Peritoneal Washings  Result Value Ref Range Status   Specimen Description PERITONEAL  Final   Special Requests BOTTLES DRAWN AEROBIC AND ANAEROBIC 10CC  Final   Culture   Final    NO GROWTH 5 DAYS Performed at [facility], [address], [place], [place] [address]    Report Status 06/11/2020 FINAL  Final  Gram stain     Status: None   Collection Time: 06/06/20 11:15 AM   Specimen: Peritoneal Washings  Result Value Ref Range Status   Specimen Description PERITONEAL  Final   Special Requests NONE  Final   Gram Stain   Final    CYTOSPIN SMEAR NO ORGANISMS SEEN WBC PRESENT, PREDOMINANTLY MONONUCLEAR Performed at [facility], [address], [place], [place] [address]    Report Status 06/06/2020 FINAL  Final    Lab Basic Metabolic Panel: Recent Labs  Lab 06/06/20 0455 06/08/20 1047  NA 148* 147*  K 3.5 3.4*  CL 117* 119*  CO2 23 22  GLUCOSE 109* 109*  BUN 19 16  CREATININE 0.79 0.75  CALCIUM 7.8* 7.6*  MG 2.1  --    Liver Function Tests: Recent Labs  Lab 06/06/20 0455 06/08/20 1047  AST 194* 147*  ALT 30 33  ALKPHOS 148* 146*  BILITOT 3.3* 3.6*  PROT 5.0* 4.8*  ALBUMIN 2.3* 2.1*    Recent Labs  Lab 06/06/20 0455 06/08/20 0529  AMMONIA 97* 110*   CBC: Recent Labs  Lab 06/06/20 0455 06/08/20 1047  WBC 12.2* 14.6*  HGB 8.3* 8.1*  HCT 26.1* 25.5*  MCV 98.9 99.6  PLT 207 162   Sepsis Labs: Recent Labs  Lab 06/06/20 0455 06/08/20 1047  WBC 12.2* 14.6*    Procedures/Operations  -See above for x-ray reports.  -Endoscopy and paracentesis performed.   Barton Dubois 24-Jun-2020, 5:46 PM

## 2020-06-13 DEATH — deceased

## 2020-06-18 ENCOUNTER — Ambulatory Visit: Payer: Medicare HMO | Admitting: Urology

## 2020-06-25 ENCOUNTER — Telehealth: Payer: Medicare HMO | Admitting: Psychiatry

## 2020-06-27 ENCOUNTER — Ambulatory Visit: Payer: Medicare HMO | Admitting: Gastroenterology

## 2020-07-23 ENCOUNTER — Ambulatory Visit: Payer: Medicare HMO | Admitting: Internal Medicine

## 2022-11-15 IMAGING — CT CT ABD-PELV W/ CM
2 of 5 series · 13 of 46 positions shown, 15 images · IV contrast (Omnipaque or Isovue)
Comparison: CT 02/12/2020

CLINICAL DATA: Abdominal pain, fever, right upper quadrant pain

EXAM:
CT ABDOMEN AND PELVIS WITH CONTRAST
TECHNIQUE: Multidetector CT imaging of the abdomen and pelvis was performed
using the standard protocol following bolus administration of
intravenous contrast.
CONTRAST:  100mL OMNIPAQUE IOHEXOL 300 MG/ML  SOLN

[Series 2: axial (person_name) (person_name) · axial · 0.64mm/px · z∈[-726,-331]mm · 10 of 93 slices shown, 12 images]
[im 9/93  soft-tissue]
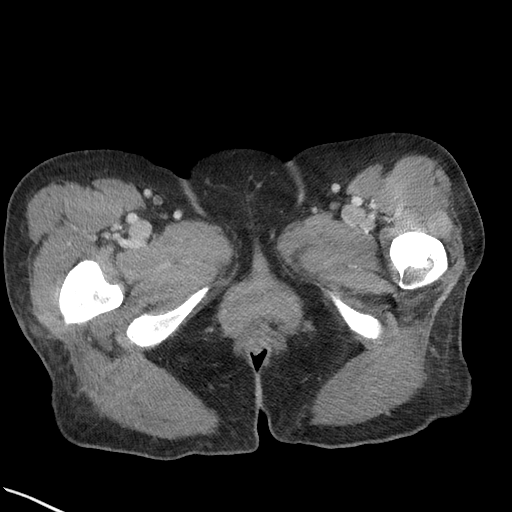
[im 9/93  bone]
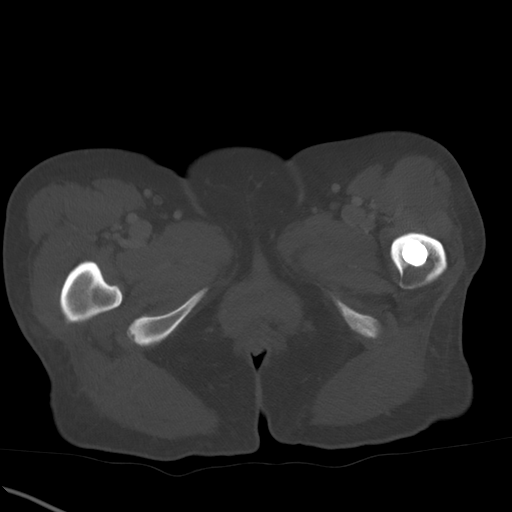
[im 18/93  soft-tissue]
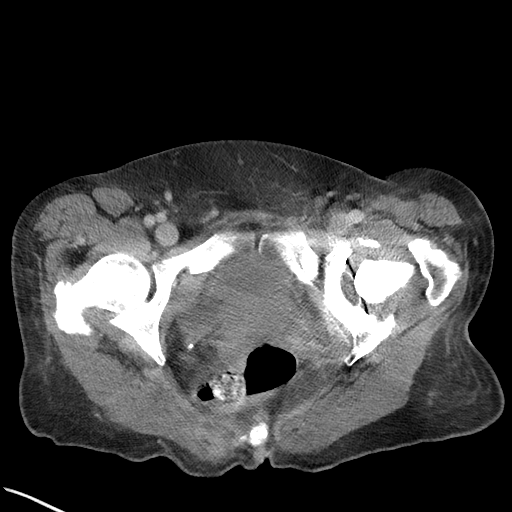
[im 27/93  soft-tissue]
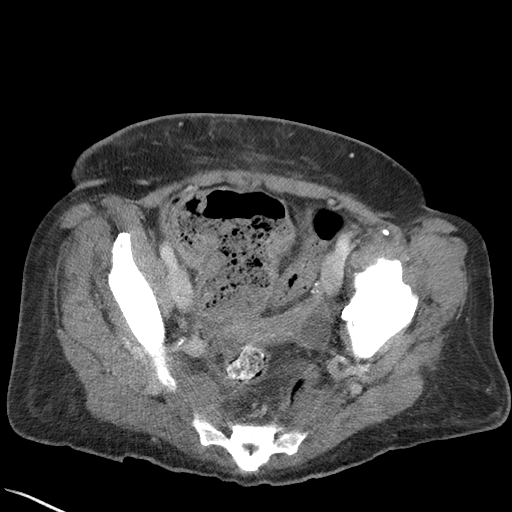
[im 36/93  soft-tissue]
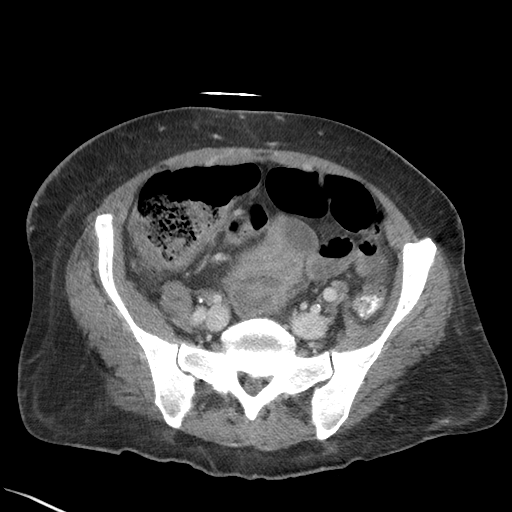
[im 44/93  soft-tissue]
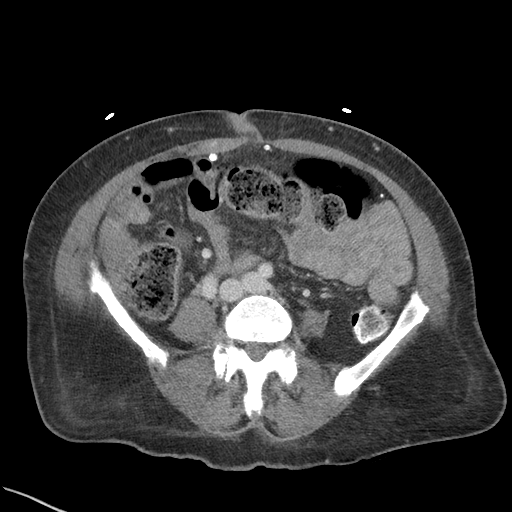
[im 53/93  soft-tissue]
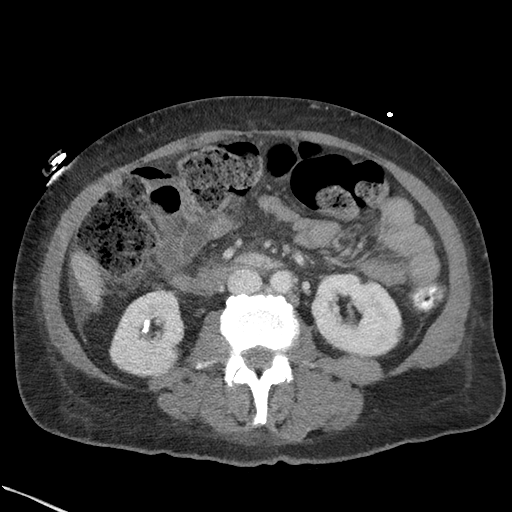
[im 62/93  soft-tissue]
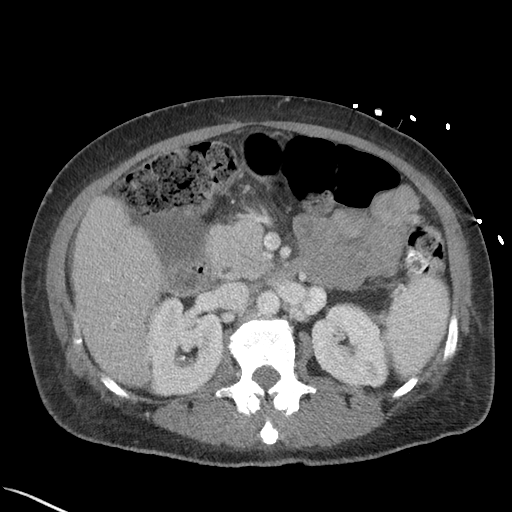
[im 71/93  soft-tissue]
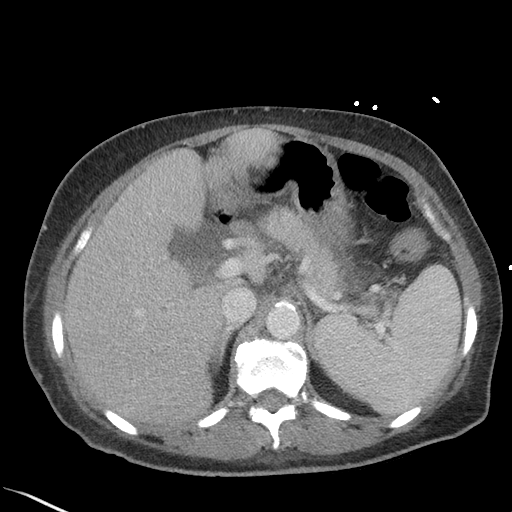
[im 79/93  soft-tissue]
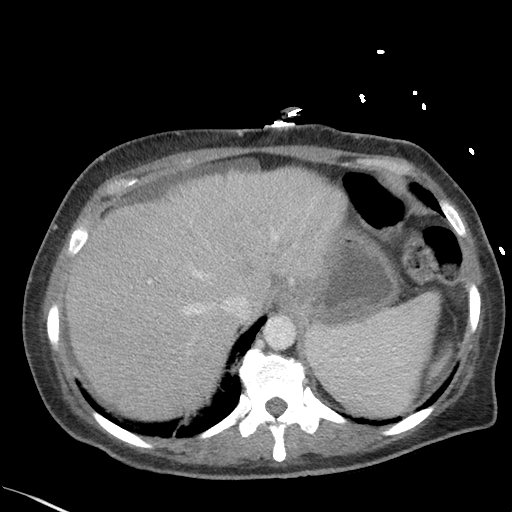
[im 79/93  bone]
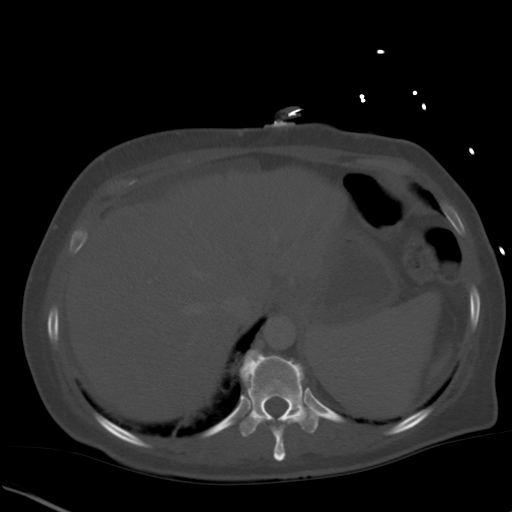
[im 88/93  soft-tissue]
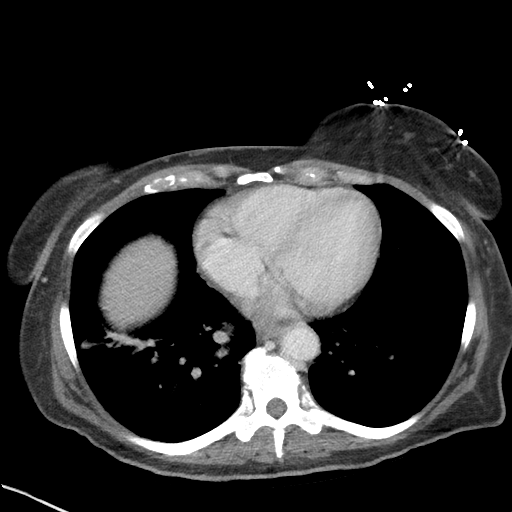

[Series 5: coronal st · coronal · 0.70mm/px · 3 of 88 slices shown]
[im 30/88  soft-tissue]
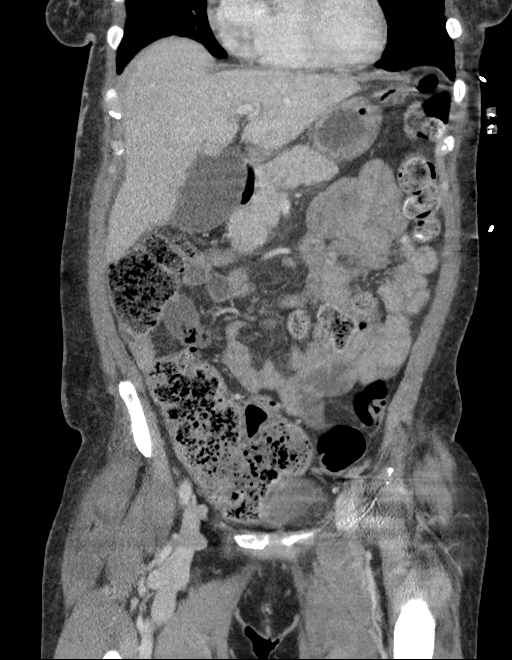
[im 39/88  soft-tissue]
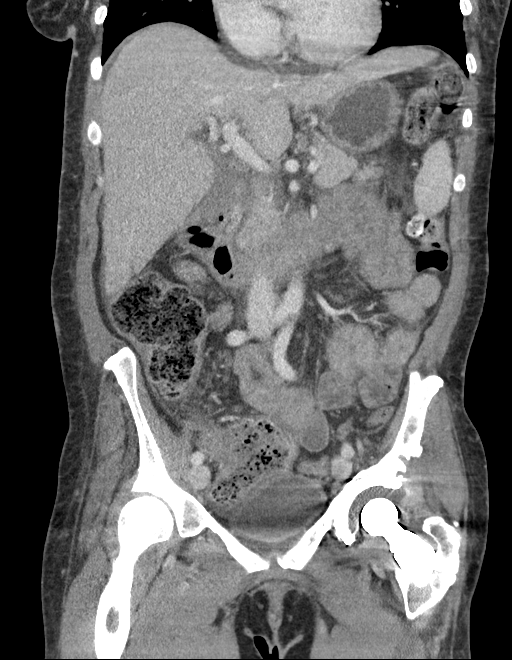
[im 49/88  soft-tissue]
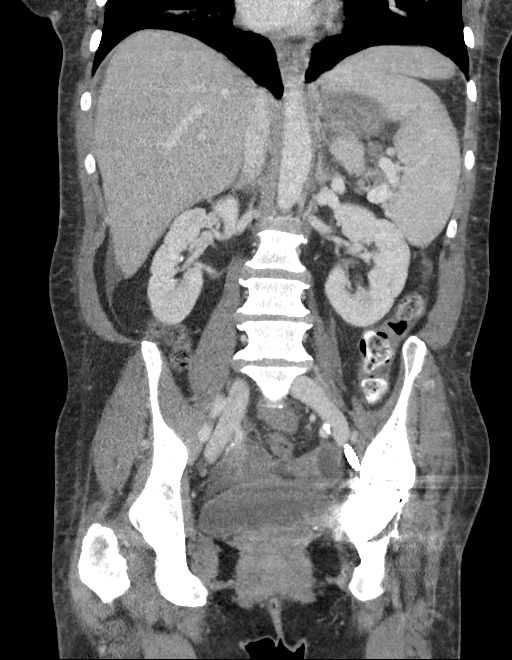

[13 of 46 positions shown; findings below may reference images not displayed]

FINDINGS: Lower chest: Basilar atelectatic changes in the lungs including more
bandlike areas of subsegmental atelectasis or scarring. Normal heart
size. No pericardial effusion.

Hepatobiliary: Heterogeneous hepatic attenuation with a nodular
liver surface contour concerning for intrinsic liver
disease/cirrhosis. No concerning focal liver lesion. Moderate
gallbladder distension. No significant gallbladder wall thickening
or pericholecystic inflammation is seen. No visible calcified
gallstones or biliary ductal dilatation.

Pancreas: Slightly edematous appearance of the pancreatic parenchyma
with very faint peripancreatic haze, uniform enhancement. No
pancreatic ductal dilatation.

Spleen: Mild splenomegaly.  No concerning focal splenic lesion.

Adrenals/Urinary Tract: Normal adrenals. Kidneys enhance
symmetrically. Symmetric and uniform excretion. No concerning focal
renal lesion. Bilateral renal cortical scarring. Nonobstructing
calculi present in both kidneys. No obstructive urolithiasis or
hydronephrosis is seen at this time. Bladder is unremarkable for the
degree of distention.

Stomach/Bowel: Paraesophageal venous collaterals and varices. Some
minimal collateralization and varices about the gastric fundus as
well. Thickening towards the gastric antrum likely reflecting normal
peristalsis. Air and fluid-filled appearance of the duodenum with a
normal sweep across the midline abdomen. Slightly edematous
appearance diffusely through the small bowel is nonspecific given
additional findings above. Moderate right-sided colonic stool
burden, particularly within the cecum. Some mild circumferential
thickening and mucosal hyperemia about the cecum and ascending colon
is nonspecific given hepatic features. No evidence of mechanical
obstruction. Appendix is not well visualized.

Vascular/Lymphatic: Atherosclerotic calcifications within the
abdominal aorta and branch vessels. No aneurysm or ectasia.
Paraesophageal and gastric varices with venous collaterals, as
above. Splenorenal collateralization. Upper abdominal varices.No
enlarged abdominopelvic lymph nodes. Few scattered calcified lymph
nodes are present in the abdomen and pelvis.

Reproductive: Portion the pelvis obscured by streak artifact. 2.6 cm
cystic focus in the left adnexa, unchanged from comparison. No
concerning right adnexal lesion. Prior hysterectomy.

Other: Increasing edematous changes of the central mesentery. Small
volume low-attenuation fluid in the deep pelvis as well as trace
perihepatic ascites. Circumferential body wall edema. No bowel
containing hernia. No free abdominopelvic air.

Musculoskeletal: Multilevel degenerative changes are present in the
imaged portions of the spine. Grade 1 anterolisthesis L4 on L5
without spondylolysis, unchanged from prior. Prior left hip
arthroplasty with chronic bony remodeling and a similar appearance
overall to comparison prior.
IMPRESSION: Constellation of features suggestive of intrinsic liver
disease/cirrhosis with features of portal hypertension including a
nodular, heterogeneous liver with paraesophageal and gastric
varices, additional upper abdominal venous collateralization,
splenorenal shunting and splenomegaly. Interval development of a
small volume ascites as well as additional features of at least mild
or early developing anasarca with circumferential body wall edema
and central mesenteric edema.

Some hazy peripancreatic stranding may be present, possibly related
to a diffusely edematous appearance of the mesentery though could
correlate with lipase.

Mildly thickened appearance of the cecum and ascending colon as well
as a diffusely edematous appearance of the small bowel is
nonspecific in the setting of suspected intrinsic liver disease.
Could reflect portal enteropathy/colopathy though should exclude
clinical symptoms of enterocolitis.

Bilateral nonobstructing nephrolithiasis. No obstructive
urolithiasis or hydronephrosis at this time. Bilateral renal
cortical scarring.

Prior hysterectomy. Stable 2.7 cm left ovarian cyst. Continued
stability favoring a benign process. No follow-up imaging
recommended. Note: This recommendation does not apply to
premenarchal patients and to those with increased risk (genetic,
family history, elevated tumor markers or other high-risk factors)
of ovarian cancer. Reference: JACR [DATE]):[id number]

Aortic Atherosclerosis ([id number]-146.6).

## 2022-11-15 IMAGING — DX DG CHEST 1V PORT
1 series · 2 of 2 positions shown · non-contrast
Comparison: February 12, 2020

CLINICAL DATA: Right sided rib pain.

EXAM:
PORTABLE CHEST 1 VIEW

[Series 1: chest ap · 0.14mm/px · 2 of 2 slices shown]
[im 1/2]
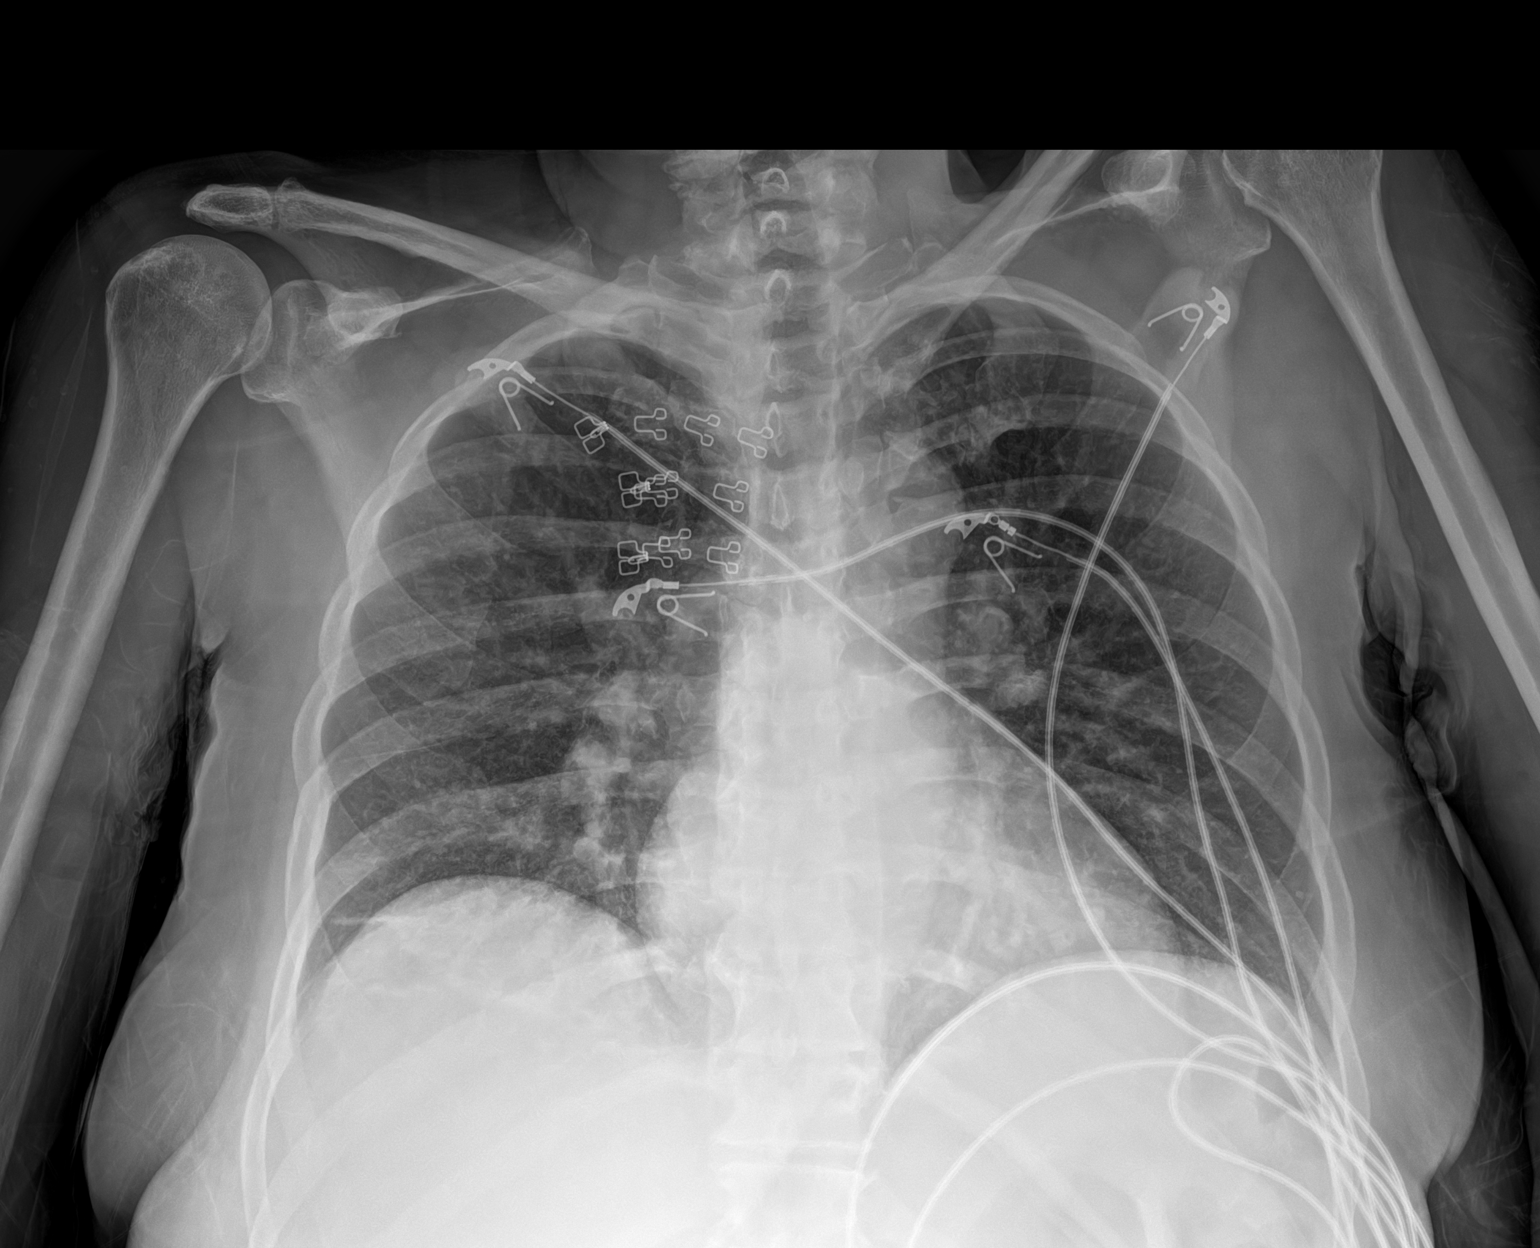
[im 2/2]
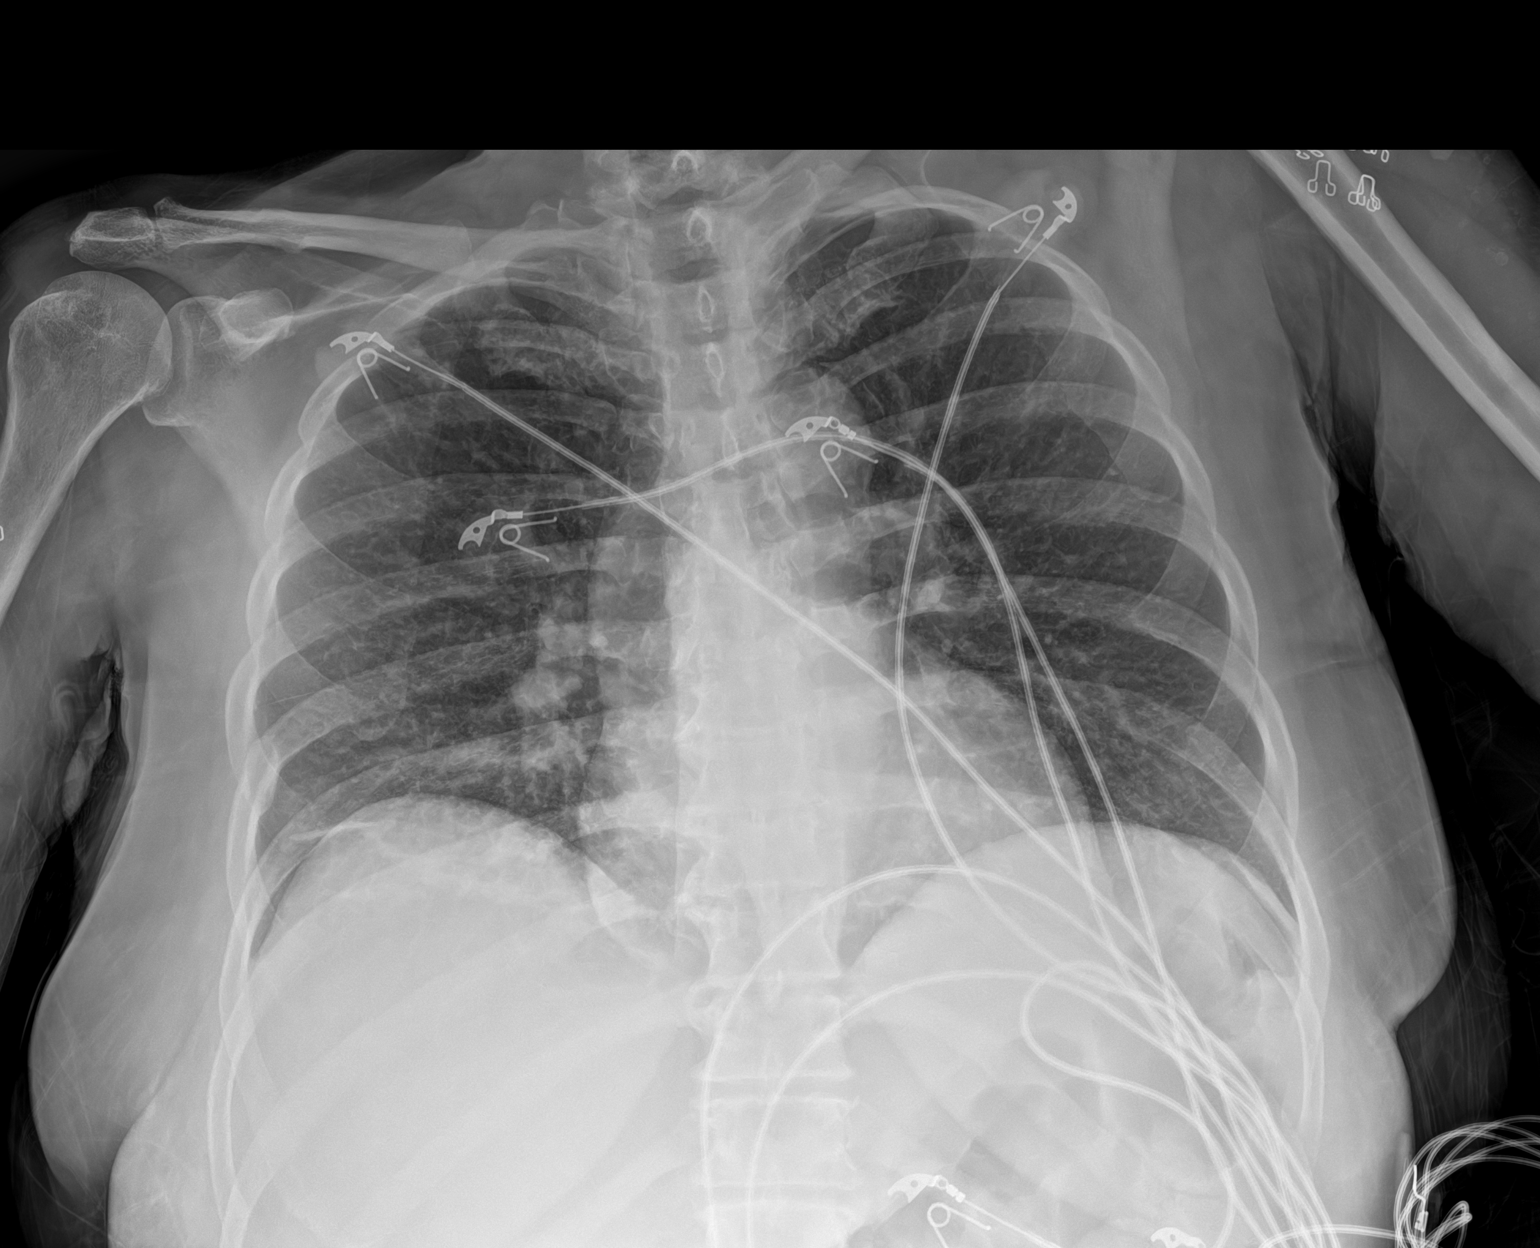

[2 of 2 positions shown; findings below may reference images not displayed]

FINDINGS: A very mild amount of linear atelectasis is seen within the right
lung base. There is no evidence of acute infiltrate, pleural
effusion or pneumothorax. The heart size and mediastinal contours
are within normal limits. A chronic seventh right rib fracture is
seen.
IMPRESSION: 1. Very mild right basilar linear atelectasis.
2. Chronic seventh right rib fracture.
